# Patient Record
Sex: Female | Born: 1955
Health system: Southern US, Community
[De-identification: ages and names within clinical notes are randomized; demographics above are authoritative.]

## PROBLEM LIST (undated history)

## (undated) DIAGNOSIS — M109 Gout, unspecified: Secondary | ICD-10-CM

## (undated) DIAGNOSIS — D369 Benign neoplasm, unspecified site: Secondary | ICD-10-CM

## (undated) DIAGNOSIS — M349 Systemic sclerosis, unspecified: Secondary | ICD-10-CM

## (undated) DIAGNOSIS — M069 Rheumatoid arthritis, unspecified: Secondary | ICD-10-CM

## (undated) DIAGNOSIS — M797 Fibromyalgia: Secondary | ICD-10-CM

## (undated) DIAGNOSIS — K220 Achalasia of cardia: Secondary | ICD-10-CM

## (undated) DIAGNOSIS — I341 Nonrheumatic mitral (valve) prolapse: Secondary | ICD-10-CM

## (undated) DIAGNOSIS — J302 Other seasonal allergic rhinitis: Secondary | ICD-10-CM

## (undated) DIAGNOSIS — K219 Gastro-esophageal reflux disease without esophagitis: Secondary | ICD-10-CM

## (undated) DIAGNOSIS — I10 Essential (primary) hypertension: Secondary | ICD-10-CM

## (undated) DIAGNOSIS — I7300 Raynaud's syndrome without gangrene: Secondary | ICD-10-CM

## (undated) DIAGNOSIS — Z992 Dependence on renal dialysis: Secondary | ICD-10-CM

## (undated) DIAGNOSIS — N870 Mild cervical dysplasia: Secondary | ICD-10-CM

## (undated) DIAGNOSIS — Z9109 Other allergy status, other than to drugs and biological substances: Secondary | ICD-10-CM

## (undated) DIAGNOSIS — N186 End stage renal disease: Secondary | ICD-10-CM

## (undated) DIAGNOSIS — F419 Anxiety disorder, unspecified: Secondary | ICD-10-CM

## (undated) DIAGNOSIS — D696 Thrombocytopenia, unspecified: Secondary | ICD-10-CM

## (undated) DIAGNOSIS — K589 Irritable bowel syndrome without diarrhea: Secondary | ICD-10-CM

## (undated) HISTORY — PX: OTHER SURGICAL HISTORY: SHX169

## (undated) HISTORY — DX: Other seasonal allergic rhinitis: J30.2

## (undated) HISTORY — DX: Gout, unspecified: M10.9

## (undated) HISTORY — DX: Irritable bowel syndrome, unspecified: K58.9

## (undated) HISTORY — DX: Fibromyalgia: M79.7

## (undated) HISTORY — DX: Essential (primary) hypertension: I10

## (undated) HISTORY — PX: ANKLE FRACTURE SURGERY: SHX122

## (undated) HISTORY — PX: BREAST BIOPSY: SHX20

## (undated) HISTORY — DX: Rheumatoid arthritis, unspecified: M06.9

## (undated) HISTORY — DX: Achalasia of cardia: K22.0

## (undated) HISTORY — PX: MYOMECTOMY: SHX85

## (undated) HISTORY — DX: Benign neoplasm, unspecified site: D36.9

## (undated) HISTORY — DX: Systemic sclerosis, unspecified: M34.9

## (undated) HISTORY — DX: Raynaud's syndrome without gangrene: I73.00

## (undated) HISTORY — DX: Mild cervical dysplasia: N87.0

## (undated) HISTORY — DX: Nonrheumatic mitral (valve) prolapse: I34.1

---

## 1998-02-03 ENCOUNTER — Encounter: Payer: Self-pay | Admitting: Emergency Medicine

## 1998-02-03 ENCOUNTER — Observation Stay (HOSPITAL_COMMUNITY): Admission: EM | Admit: 1998-02-03 | Discharge: 1998-02-04 | Payer: Self-pay | Admitting: Emergency Medicine

## 1998-02-03 ENCOUNTER — Encounter: Payer: Self-pay | Admitting: Orthopedic Surgery

## 1998-02-04 ENCOUNTER — Encounter: Payer: Self-pay | Admitting: Orthopedic Surgery

## 1998-07-01 ENCOUNTER — Ambulatory Visit (HOSPITAL_COMMUNITY): Admission: RE | Admit: 1998-07-01 | Discharge: 1998-07-01 | Payer: Self-pay | Admitting: Family Medicine

## 1998-07-01 ENCOUNTER — Encounter: Payer: Self-pay | Admitting: Family Medicine

## 1998-07-07 ENCOUNTER — Ambulatory Visit (HOSPITAL_COMMUNITY): Admission: RE | Admit: 1998-07-07 | Discharge: 1998-07-07 | Payer: Self-pay | Admitting: Family Medicine

## 1998-07-07 ENCOUNTER — Encounter: Payer: Self-pay | Admitting: Family Medicine

## 1998-07-22 ENCOUNTER — Ambulatory Visit (HOSPITAL_COMMUNITY): Admission: RE | Admit: 1998-07-22 | Discharge: 1998-07-22 | Payer: Self-pay | Admitting: Gynecology

## 1998-08-15 ENCOUNTER — Ambulatory Visit (HOSPITAL_COMMUNITY): Admission: RE | Admit: 1998-08-15 | Discharge: 1998-08-15 | Payer: Self-pay | Admitting: Family Medicine

## 1998-08-15 ENCOUNTER — Encounter: Payer: Self-pay | Admitting: Family Medicine

## 1998-12-12 ENCOUNTER — Ambulatory Visit (HOSPITAL_COMMUNITY): Admission: RE | Admit: 1998-12-12 | Discharge: 1998-12-12 | Payer: Self-pay | Admitting: Rheumatology

## 1999-01-28 ENCOUNTER — Ambulatory Visit (HOSPITAL_COMMUNITY): Admission: RE | Admit: 1999-01-28 | Discharge: 1999-01-28 | Payer: Self-pay | Admitting: Rheumatology

## 1999-06-01 ENCOUNTER — Emergency Department (HOSPITAL_COMMUNITY): Admission: EM | Admit: 1999-06-01 | Discharge: 1999-06-01 | Payer: Self-pay | Admitting: Emergency Medicine

## 1999-07-07 ENCOUNTER — Ambulatory Visit (HOSPITAL_COMMUNITY): Admission: RE | Admit: 1999-07-07 | Discharge: 1999-07-07 | Payer: Self-pay | Admitting: Gastroenterology

## 1999-07-07 ENCOUNTER — Encounter: Payer: Self-pay | Admitting: Gastroenterology

## 1999-07-27 ENCOUNTER — Ambulatory Visit (HOSPITAL_COMMUNITY): Admission: RE | Admit: 1999-07-27 | Discharge: 1999-07-27 | Payer: Self-pay | Admitting: *Deleted

## 1999-07-27 ENCOUNTER — Encounter: Payer: Self-pay | Admitting: *Deleted

## 1999-08-12 ENCOUNTER — Encounter: Payer: Self-pay | Admitting: Gastroenterology

## 1999-08-12 ENCOUNTER — Ambulatory Visit (HOSPITAL_COMMUNITY): Admission: RE | Admit: 1999-08-12 | Discharge: 1999-08-12 | Payer: Self-pay | Admitting: Gastroenterology

## 1999-09-25 ENCOUNTER — Encounter: Payer: Self-pay | Admitting: Family Medicine

## 1999-09-25 ENCOUNTER — Ambulatory Visit (HOSPITAL_COMMUNITY): Admission: RE | Admit: 1999-09-25 | Discharge: 1999-09-25 | Payer: Self-pay | Admitting: Family Medicine

## 1999-11-27 ENCOUNTER — Encounter: Payer: Self-pay | Admitting: Family Medicine

## 1999-11-27 ENCOUNTER — Ambulatory Visit (HOSPITAL_COMMUNITY): Admission: RE | Admit: 1999-11-27 | Discharge: 1999-11-27 | Payer: Self-pay | Admitting: Family Medicine

## 2000-05-23 ENCOUNTER — Other Ambulatory Visit: Admission: RE | Admit: 2000-05-23 | Discharge: 2000-05-23 | Payer: Self-pay | Admitting: Gynecology

## 2000-05-23 ENCOUNTER — Encounter (INDEPENDENT_AMBULATORY_CARE_PROVIDER_SITE_OTHER): Payer: Self-pay

## 2000-07-13 ENCOUNTER — Encounter: Payer: Self-pay | Admitting: Family Medicine

## 2000-07-13 ENCOUNTER — Ambulatory Visit (HOSPITAL_COMMUNITY): Admission: RE | Admit: 2000-07-13 | Discharge: 2000-07-13 | Payer: Self-pay | Admitting: Family Medicine

## 2000-07-22 ENCOUNTER — Other Ambulatory Visit: Admission: RE | Admit: 2000-07-22 | Discharge: 2000-07-22 | Payer: Self-pay | Admitting: Gynecology

## 2000-08-26 ENCOUNTER — Ambulatory Visit (HOSPITAL_COMMUNITY): Admission: RE | Admit: 2000-08-26 | Discharge: 2000-08-26 | Payer: Self-pay | Admitting: Rheumatology

## 2000-09-02 ENCOUNTER — Ambulatory Visit (HOSPITAL_COMMUNITY): Admission: RE | Admit: 2000-09-02 | Discharge: 2000-09-02 | Payer: Self-pay | Admitting: Rheumatology

## 2000-10-21 ENCOUNTER — Encounter (INDEPENDENT_AMBULATORY_CARE_PROVIDER_SITE_OTHER): Payer: Self-pay | Admitting: Specialist

## 2000-10-21 ENCOUNTER — Ambulatory Visit (HOSPITAL_COMMUNITY): Admission: RE | Admit: 2000-10-21 | Discharge: 2000-10-21 | Payer: Self-pay | Admitting: Gynecology

## 2000-12-16 ENCOUNTER — Encounter: Payer: Self-pay | Admitting: Gastroenterology

## 2000-12-16 ENCOUNTER — Encounter: Admission: RE | Admit: 2000-12-16 | Discharge: 2000-12-16 | Payer: Self-pay | Admitting: Gastroenterology

## 2001-03-30 ENCOUNTER — Ambulatory Visit (HOSPITAL_COMMUNITY): Admission: RE | Admit: 2001-03-30 | Discharge: 2001-03-30 | Payer: Self-pay | Admitting: Gastroenterology

## 2001-06-22 ENCOUNTER — Other Ambulatory Visit: Admission: RE | Admit: 2001-06-22 | Discharge: 2001-06-22 | Payer: Self-pay | Admitting: Gynecology

## 2001-12-04 ENCOUNTER — Encounter: Payer: Self-pay | Admitting: Gastroenterology

## 2001-12-04 ENCOUNTER — Encounter: Admission: RE | Admit: 2001-12-04 | Discharge: 2001-12-04 | Payer: Self-pay | Admitting: Gastroenterology

## 2001-12-27 ENCOUNTER — Ambulatory Visit (HOSPITAL_COMMUNITY): Admission: RE | Admit: 2001-12-27 | Discharge: 2001-12-27 | Payer: Self-pay | Admitting: Rheumatology

## 2001-12-27 ENCOUNTER — Encounter: Payer: Self-pay | Admitting: Cardiovascular Disease

## 2002-06-04 ENCOUNTER — Ambulatory Visit: Admission: RE | Admit: 2002-06-04 | Discharge: 2002-06-04 | Payer: Self-pay | Admitting: Family Medicine

## 2002-06-04 ENCOUNTER — Encounter: Payer: Self-pay | Admitting: Family Medicine

## 2002-07-06 ENCOUNTER — Other Ambulatory Visit: Admission: RE | Admit: 2002-07-06 | Discharge: 2002-07-06 | Payer: Self-pay | Admitting: Gynecology

## 2003-02-15 ENCOUNTER — Ambulatory Visit (HOSPITAL_COMMUNITY): Admission: RE | Admit: 2003-02-15 | Discharge: 2003-02-15 | Payer: Self-pay | Admitting: Rheumatology

## 2003-07-03 ENCOUNTER — Other Ambulatory Visit: Admission: RE | Admit: 2003-07-03 | Discharge: 2003-07-03 | Payer: Self-pay | Admitting: Gynecology

## 2003-07-16 ENCOUNTER — Ambulatory Visit (HOSPITAL_COMMUNITY): Admission: RE | Admit: 2003-07-16 | Discharge: 2003-07-16 | Payer: Self-pay | Admitting: Family Medicine

## 2003-09-23 ENCOUNTER — Other Ambulatory Visit: Admission: RE | Admit: 2003-09-23 | Discharge: 2003-09-23 | Payer: Self-pay | Admitting: Gynecology

## 2003-12-20 ENCOUNTER — Ambulatory Visit (HOSPITAL_BASED_OUTPATIENT_CLINIC_OR_DEPARTMENT_OTHER): Admission: RE | Admit: 2003-12-20 | Discharge: 2003-12-20 | Payer: Self-pay | Admitting: Gynecology

## 2004-02-14 ENCOUNTER — Emergency Department (HOSPITAL_COMMUNITY): Admission: EM | Admit: 2004-02-14 | Discharge: 2004-02-14 | Payer: Self-pay | Admitting: Emergency Medicine

## 2004-04-09 ENCOUNTER — Other Ambulatory Visit: Admission: RE | Admit: 2004-04-09 | Discharge: 2004-04-09 | Payer: Self-pay | Admitting: Gynecology

## 2004-10-23 ENCOUNTER — Other Ambulatory Visit: Admission: RE | Admit: 2004-10-23 | Discharge: 2004-10-23 | Payer: Self-pay | Admitting: Gynecology

## 2005-02-12 ENCOUNTER — Encounter: Admission: RE | Admit: 2005-02-12 | Discharge: 2005-02-12 | Payer: Self-pay | Admitting: Orthopaedic Surgery

## 2005-03-12 ENCOUNTER — Other Ambulatory Visit: Admission: RE | Admit: 2005-03-12 | Discharge: 2005-03-12 | Payer: Self-pay | Admitting: Gynecology

## 2005-07-08 ENCOUNTER — Encounter: Admission: RE | Admit: 2005-07-08 | Discharge: 2005-07-08 | Payer: Self-pay | Admitting: Gastroenterology

## 2005-11-04 ENCOUNTER — Other Ambulatory Visit: Admission: RE | Admit: 2005-11-04 | Discharge: 2005-11-04 | Payer: Self-pay | Admitting: Gynecology

## 2005-11-11 ENCOUNTER — Encounter: Admission: RE | Admit: 2005-11-11 | Discharge: 2005-11-11 | Payer: Self-pay | Admitting: Cardiology

## 2006-11-14 ENCOUNTER — Other Ambulatory Visit: Admission: RE | Admit: 2006-11-14 | Discharge: 2006-11-14 | Payer: Self-pay | Admitting: Gynecology

## 2007-11-17 ENCOUNTER — Ambulatory Visit: Payer: Self-pay | Admitting: Gynecology

## 2007-12-05 ENCOUNTER — Other Ambulatory Visit: Admission: RE | Admit: 2007-12-05 | Discharge: 2007-12-05 | Payer: Self-pay | Admitting: Gynecology

## 2007-12-05 ENCOUNTER — Encounter: Payer: Self-pay | Admitting: Gynecology

## 2007-12-05 ENCOUNTER — Ambulatory Visit: Payer: Self-pay | Admitting: Gynecology

## 2008-01-02 ENCOUNTER — Ambulatory Visit: Payer: Self-pay | Admitting: Gynecology

## 2008-01-08 DIAGNOSIS — D369 Benign neoplasm, unspecified site: Secondary | ICD-10-CM

## 2008-01-08 HISTORY — PX: COLONOSCOPY W/ BIOPSIES: SHX1374

## 2008-01-08 HISTORY — DX: Benign neoplasm, unspecified site: D36.9

## 2008-02-14 ENCOUNTER — Ambulatory Visit: Payer: Self-pay | Admitting: Gynecology

## 2008-02-21 ENCOUNTER — Ambulatory Visit: Payer: Self-pay | Admitting: Gynecology

## 2008-05-29 ENCOUNTER — Encounter: Admission: RE | Admit: 2008-05-29 | Discharge: 2008-05-29 | Payer: Self-pay | Admitting: Orthopaedic Surgery

## 2008-06-13 ENCOUNTER — Encounter: Admission: RE | Admit: 2008-06-13 | Discharge: 2008-06-13 | Payer: Self-pay | Admitting: Family Medicine

## 2008-06-25 ENCOUNTER — Ambulatory Visit (HOSPITAL_COMMUNITY): Admission: RE | Admit: 2008-06-25 | Discharge: 2008-06-25 | Payer: Self-pay | Admitting: Family Medicine

## 2008-07-29 ENCOUNTER — Encounter: Admission: RE | Admit: 2008-07-29 | Discharge: 2008-07-29 | Payer: Self-pay | Admitting: Orthopaedic Surgery

## 2008-07-31 ENCOUNTER — Ambulatory Visit: Payer: Self-pay | Admitting: Gynecology

## 2008-08-07 ENCOUNTER — Ambulatory Visit: Payer: Self-pay | Admitting: Gynecology

## 2008-08-15 ENCOUNTER — Ambulatory Visit: Payer: Self-pay | Admitting: Gynecology

## 2008-11-27 ENCOUNTER — Ambulatory Visit: Payer: Self-pay | Admitting: Gynecology

## 2008-12-10 ENCOUNTER — Other Ambulatory Visit: Admission: RE | Admit: 2008-12-10 | Discharge: 2008-12-10 | Payer: Self-pay | Admitting: Gynecology

## 2008-12-10 ENCOUNTER — Ambulatory Visit: Payer: Self-pay | Admitting: Gynecology

## 2009-01-01 ENCOUNTER — Ambulatory Visit: Payer: Self-pay | Admitting: Gynecology

## 2009-01-11 HISTORY — PX: PELVIC LAPAROSCOPY: SHX162

## 2009-01-28 ENCOUNTER — Encounter: Admission: RE | Admit: 2009-01-28 | Discharge: 2009-01-28 | Payer: Self-pay | Admitting: Gastroenterology

## 2009-06-02 ENCOUNTER — Ambulatory Visit: Payer: Self-pay | Admitting: Gynecology

## 2009-09-03 ENCOUNTER — Ambulatory Visit: Payer: Self-pay | Admitting: Gynecology

## 2009-10-07 ENCOUNTER — Ambulatory Visit: Payer: Self-pay | Admitting: Gynecology

## 2009-10-10 ENCOUNTER — Ambulatory Visit: Payer: Self-pay | Admitting: Gynecology

## 2009-11-06 ENCOUNTER — Other Ambulatory Visit: Admission: RE | Admit: 2009-11-06 | Discharge: 2009-11-06 | Payer: Self-pay | Admitting: Gynecology

## 2009-11-06 ENCOUNTER — Ambulatory Visit: Payer: Self-pay | Admitting: Gynecology

## 2009-12-16 ENCOUNTER — Ambulatory Visit (HOSPITAL_COMMUNITY)
Admission: RE | Admit: 2009-12-16 | Discharge: 2009-12-16 | Payer: Self-pay | Source: Home / Self Care | Admitting: Internal Medicine

## 2010-05-29 NOTE — H&P (Signed)
Katie Nunez, Katie Nunez              ACCOUNT NO.:  [id number]   MEDICAL RECORD NO.:  [id number]            PATIENT TYPE:   LOCATION:                                 FACILITY:   PHYSICIAN:  Juan H. Toney Rakes, M.D.     DATE OF BIRTH:   DATE OF ADMISSION:  DATE OF DISCHARGE:                                HISTORY & PHYSICAL   CHIEF COMPLAINT:  Cervical intra-epithelial neoplasia.   HISTORY:  The patient is a 55 year old gravida 1, para 1, who was seen in  the office on July 03, 2003, for annual gynecological examination.  The  patient was seen in the office on November 05, 2003, to discuss the results  of her colposcopic directed biopsy which had been done on October 31, 2003,  as a result of two previous Pap smears which had demonstrated atypical  squamous cells of undetermined significance.  Her cervical biopsies had  demonstrated benign endocervical curettings and the ectocervix at 7 o'clock  position were normal.  The 5 o'clock and 11 o'clock both had low-grade CIN 1  some HPV changes.  Treatment modalities have been discussed with the  patient, she opted to proceed with CO2 laser ablation as an outpatient  procedure under anesthesia.  The patient had previously been provided with  literature information from [employer] of OB/GYN on diagnosis and  management of abnormal Pap smears.   PAST MEDICAL HISTORY:  1.  Scleroderma.  2.  Raynaud's phenomenon.  3.  Mitral valve prolapse.  4.  Irritable bowel syndrome.  5.  Seasonal allergies.   ALLERGIES:  No known drug allergies.   PAST SURGICAL HISTORY:  1.  Right ankle fracture, open reduction.  2.  Myomectomy.  3.  Diagnostic laparoscopy.  4.  Cesarean section.   CURRENT MEDICATIONS:  1.  Baby aspirin.  2.  Norvasc.  3.  Levsin.   PHYSICAL EXAMINATION:  VITAL SIGNS:  The patient weighs 186 pounds, height 5  feet 6 inches tall, blood pressure 126/80.  HEENT:  Unremarkable.  NECK:  Supple.  Trachea was midline.  No  carotid bruits, no thyromegaly.  LUNGS:  Clear to auscultation.  No rhonchi or wheezes.  HEART:  Regular rate and rhythm, no murmurs, rubs, or gallops.  BREASTS:  Done at time of her annual gynecological examination in June 2004,  was reported to be normal.  ABDOMEN:  Soft, nontender, without any rebound or guarding.  PELVIC:  Bartholin's, urethra, and Skene's glands within normal limits.  The  patient with known history of leiomyomata uteri which have been stable over  the past several years;  the large one measuring 2 cm intramural.  Adnexa no  palpable mass or tenderness.  RECTAL:  Not done.   ASSESSMENT:  [name], para 1, with abnormal Pap smears in  June 2005, and September 2005, with atypical squamous cells.  Subsequent  followup with colposcopy on October 31, 2003, demonstrated a benign  endocervical mucosa from the [facility] and cervical biopsy at the 5 o'clock  position demonstrated low-grade CIN 1 at the 11 o'clock position, also  CIN 1  along with HPV changes.  The patient had been presented with various  treatment options and decided to proceed with CO2 laser ablation of cervical  dysplasia.  The risks, benefits, and pros and cons were discussed.  All  questions were answered and will follow accordingly.  Of note, the patient  is using condoms as a form of contraception.   PLAN:  The patient is scheduled for CO2 laser ablation of ongoing cervical  dysplasia at [facility] on Friday, December 20, 2003, at 1  p.m.     Juan   JHF/MEDQ  D:  12/19/2003  T:  12/19/2003  Job:  [id number]

## 2010-05-29 NOTE — H&P (Signed)
[place]  Patient:    Katie Nunez, Katie Nunez Visit Number: [id number] MRN: [id number]          Service Type: Attending:  Starlyn Skeans. Toney Rakes, M.D. Dictated by:   Starlyn Skeans Toney Rakes, M.D. Adm. Date:  10/21/00                           History and Physical  CHIEF COMPLAINT:              Pelvic pain and history of ovarian cyst.  HISTORY OF PRESENT ILLNESS:   The patient is a 55 year old gravida 1 para 1, who was seen in the office for annual gynecological examination on July 22, 2000, patient with known history of Raynauds phenomenon as well as scleroderma.  She had been on Lotensin and had been switched to Monopril.  She has been under the care of Dr. Tamala Julian and Dr. Rockwell Alexandria respectively.  She had an ultrasound done in the office on July 22, 2000 due to the fact of abdominal girth and pain, and history of irritable bowel syndrome and bloating sensation.  Her abdomen had been difficult to examine.  The ultrasound demonstrated that her uterus had several small fibroids, as noted in the past, her right ovary had a 13 x 13 mm thick-walled cyst, and the left ovary had a complex cyst with a thin wall, multiple internal septations measuring 51 x 37 x 53 mm, with a reticular echo pattern noted.  The cul-de-sac was negative. She denied any dysmenorrhea or dyspareunia, just left lower quadrant pain.  We had discussed about the limitations of CA 125 testing and to have her return and repeat the ultrasound, which she did on September 01, 2000.  She has had history in the past of cysts off and on resolving and from time to time reappearing.  The ultrasound that was done on September 01, 2000 demonstrated that the previously seen complex cyst on the left adnexa had resolved.  It appeared to have been a collapsed thick-walled corpus luteum cyst, which was now measuring 20 x 8 x 20 mm.  The right ovary had now a complex thin-walled cystic mass measuring 4.2 x 2.7 x 4.9 cm with a  reticular echo pattern, diffuse low-level echos particularly noted consistent with hemorrhagic cyst. There was a negative color-flow Doppler.  Due to the fact that she has continued to have pelvic pain off and on we discussed proceeding then with diagnostic laparoscopy to rule out endometriosis or pelvic adhesions, and possibly do laparotomy.  PAST SURGICAL HISTORY:        1. Two progress cesarean sections.                               2. Abdominal myomectomy.                               3. Fracture of left ankle.  PAST MEDICAL HISTORY:         1. History of scleroderma.                               2. Raynauds phenomenon.  3. Mitral valve prolapse.                               4. Irritable bowel syndrome.                               5. Seasonal allergies.  CURRENT MEDICATIONS:          1. Monopril for hypertension.                               2. Nexium.                               3. Baby aspirin.                               4. Klonopin.  ALLERGIES:                    She denies any allergies.  FAMILY HISTORY:               Significant for hypertension.  PHYSICAL EXAMINATION:  GENERAL:                      Well-developed, well-nourished female.  VITAL SIGNS:                  Slightly overweight at 175 pounds.  HEENT:                        Unremarkable.  NECK:                         Supple.  Trachea midline.  No carotid bruits. No thyromegaly.  LUNGS:                        Clear to auscultation, without rhonchi or wheezes.  HEART:                        Regular rate and rhythm.  No murmurs or gallops.  BREAST:                       Examination was done at the time of her annual examination on July 22, 2000 and reported to be normal.  ABDOMEN:                      Soft, nontender, without rebound or guarding. Abdomen with increased abdominal girth.  Good bowel sounds.  PELVIC:                       Bartholin, urethral, and  Skenes glands within normal limits.  Vagina and cervix, no lesions or discharge.  Uterus anteverted and normal size, shape, and consistency, though some limitation on bimanual examination.  No masses in right adnexa, questionable fullness on left adnexa.  RECTAL:                       Unremarkable.  ASSESSMENT:  This is a 55 year old gravida 1 para 1, using barrier contraception, with history of Raynauds phenomenon and scleroderma, who had been followed by Dr. Knute Neu, who had contacted me stating that she would not be a candidate for contraception for menstrual regulation or for contraception due to the fact that she had positive anticardiolipin antibodies such as IgM, but in low quantities.  Her lupus anticoagulant had been negative.  Patient with chronic pelvic pain, with recently resolved left ovarian cyst, now right ovarian cyst.  We decided to proceed with diagnostic laparoscopy and possible laparotomy, possible ovarian cystectomy.  The patient has given full authorization to remove her right adnexa if it appears there is evidence of any disease.  We discussed in the event that if this were malignancy she would need to undergo a full staging procedure and that would mean total abdominal hysterectomy and bilateral salpingo-oophorectomy, pelvic and periaortic lymphadenectomy, and partial omentectomy or removal over scope, and returning at another date after full consultation with gynecologic oncologist for full staging operation after discussing with the patient and her family.  We discussed if there if is any technical difficulty entering the abdominal cavity that we may need to open her abdomen and proceed with an open laparotomy technique and proceed with ovarian cystectomy and/or lysis of adhesions and treatment of endometriosis if found.  Other potential risks were discussed including infection, although she will receive prophylaxis antibiotics, trauma to  internal organs with the laparoscope and laser which would require open laparotomy and corrective surgery at that time, also in the  event of uncontrolled hemorrhage she is aware that she could receive a blood transfusion with potential risk of anaphylactic reaction, hepatitis and AIDS, and finally is uncontrollable hemorrhage or extensive amount of endometriosis is seen she has given full authorization to proceed with total abdominal hysterectomy and bilateral salpingo-oophorectomy.  Also, for deep vein thrombosis prophylaxis she will have pneumatic compression stockings as well. Will also discuss with the patient and will add to the consent form that if she wants to proceed with sterilization procedure we may do so at the time of diagnostic laparoscopy and ovarian cystectomy.  All these issues were discussed with the patient, all questions were answered and followed accordingly.  PLAN:                         As per assessment above. Dictated by:   Starlyn Skeans Toney Rakes, M.D. Attending:  Starlyn Skeans. Toney Rakes, M.D. DD:  10/20/00 TD:  10/21/00 Job: [id number]

## 2010-05-29 NOTE — Op Note (Signed)
[place]  Patient:    Katie Nunez, Katie Nunez Visit Number: [id number] MRN: [id number]          Service Type: DSU Location: [id number] Attending Physician:  Rosamaria Lints Dictated by:   Starlyn Skeans. Toney Rakes, M.D. Proc. Date: 10/21/00 Admit Date:  10/21/2000                             Operative Report  INDICATIONS FOR OPERATION:    [name], para 1 with history of chronic pelvic pain and a past history of two cesarean sections, along with abdominal myomectomy.  The patient has history of ovarian cysts, resolving and recurring again; the most recent ultrasound demonstrated a right ovarian cyst, although the patient was having discomfort in her left lower quadrant.  PREOPERATIVE DIAGNOSES:       1. Chronic pelvic pain.                               2. Right ovarian cyst.                               3. Fibroid uterus.  POSTOPERATIVE DIAGNOSIS:      Leiomyomatosis uteri.  ANESTHESIA:                   General endotracheal.  PROCEDURE PERFORMED:          Diagnostic laparoscopy.  SURGEON:                      Juan H. Toney Rakes, M.D.  FINDINGS:                     1. Two small intramural leiomyomas.                               2. Normal tubes, ovaries and cul-de-sac.                               3. Smooth appearing appendix and liver.  DESCRIPTION OF PROCEDURE:     After the patient was adequately counseled, she was taken to the operating room and she underwent successful general endotracheal anesthesia.  She was placed in the lower lithotomy position.  The abdomen, vagina and perineum were prepped and draped in the usual sterile fashion.  The bladder was evacuated of its contents with a red rubber Dellia Nims of approximately 50 cc.  Examination under anesthesia demonstrated anteverted uterus, slightly irregular shape.  A Hulka tenaculum was placed for manipulation during the laparoscopic procedure.  A small stab incision  was made underneath the umbilical regions, followed by insertion of the Veress needle.  Due to the patients abdominal girth, one attempt was made and was not possible to obtain an adequate pneumoperitoneum.  So, an open laparoscopic technique was utilized by identifying the fascia and making a small nick; putting a pursestring of 3-0 Vicryl suture and then inserting the laparoscopic sheath and inserting the scope, then inflated the abdomen.  After this was accomplished with a laparoscope, systemic inspection of the entire pelvis including the anterior and posterior cul-de-sac, did not reveal any evidence of endometriosis or adhesions.  Both tubes and  ovaries were normal size, shape and contour.  There was some fluid in the cul-de-sac, which was aspirated and submitted for histological evaluation.  The appendix was smooth in appearance, as was the surface of the liver.  There was no evidence of any cysts present, so the case was completed.  The laparoscope and sheath were removed after the pneumoperitoneum was removed.  The previously placed pursestring with 3-0 Vicryl suture was secured and tied.  The subcutaneous tissue in the subumbilical region was then approximated with plain catgut suture.  For postoperative analgesia approximately 8 cc of 0.25% Marcaine was infiltrated subcutaneously.  The Hulka tenaculum was removed.  The patient was extubated, transferred to recovery room with stable vital signs.  Blood loss during the procedure was minimal.  Fluid resuscitation consisted of 1000 cc of lactated Ringers.  Due to patients history of mitral valve prolapse, she did receive 1 g of Cefotan as well. Dictated by:   Starlyn Skeans Toney Rakes, M.D. Attending Physician:  Rosamaria Lints DD:  10/21/00 TD:  10/21/00 Job: [id number]

## 2010-05-29 NOTE — Op Note (Signed)
[place]  Patient:    Katie Nunez, Katie Nunez Visit Number: [id number] MRN: [id number]          Service Type: END Location: ENDO Attending Physician:  Juanita Craver Dictated by:   Nelwyn Salisbury, M.D. Proc. Date: 03/30/01 Admit Date:  03/30/2001 Discharge Date: 03/30/2001   CC:         Uvaldo Rising, M.D.  [name], M.D.   Operative Report  PROCEDURE PERFORMED:  Colonoscopy and endoscopy.  ENDOSCOPIST:  Nelwyn Salisbury, M.D.  INSTRUMENTS USED:  Olympus video colonoscope.  INDICATIONS FOR PROCEDURE:  A 55 year old [place] American female with a history of Raynauds disease, question scleroderma, with history of weight loss, left lower quadrant pain, and guaiac positive stool.  Rule out colonic polyps, masses, hemorrhoids, etc. Question irritable bowel disease.  PREPROCEDURE PREPARATION:  Informed consent was procured from the patient. The patient fasted for 8 hours prior to the procedure and prepped with a bottle of magnesium citrate and a gallon of NuLYTELY the night prior to the procedure.  PREPROCEDURE PHYSICAL:  VITAL SIGNS:  The patient had stable vital signs.  NECK:  Supple.  CHEST:  Clear to auscultation.  HEART:  S1, S2 regular.  ABDOMEN:  Soft with normal bowel sounds.  DESCRIPTION OF PROCEDURE:  The patient was placed in the left lateral decubitus position and sedated with 100 mg of Demerol and 8 mg of Versed intravenously.  Once the patient was adequately sedated, maintained on low flow oxygen and continuous cardiac monitoring, the Olympus video colonoscope was advanced into the rectum to the cecum and terminal ileum without difficulty except for small internal hemorrhoid seen on retroflexion.  No other abnormalities were noted.  No masses, polyps, erosions, lacerations, or diverticula were present.  The entire colonic mucosa of the terminal ileum appeared normal.  IMPRESSION:  Normal colonoscopy except for small  nonbleeding internal hemorrhoids.  RECOMMENDATIONS: 1. A high fiber diet has been recommended along with liberal fluid intake. 2. Repeat guaiac testing will be done. 3. Further recommendations made in followup within the next two weeks. Dictated by:   Nelwyn Salisbury, M.D. Attending Physician:  Juanita Craver DD:  03/30/01 TD:  03/31/01 Job: [id number]

## 2010-05-29 NOTE — Op Note (Signed)
Katie Nunez, Katie Nunez              ACCOUNT NO.:  [id number]   MEDICAL RECORD NO.:  [id number]          PATIENT TYPE:  AMB   LOCATION:  [facility]                         FACILITY:  [facility]   PHYSICIAN:  Juan H. Toney Rakes, M.D.DATE OF BIRTH:  05/14/1955   DATE OF PROCEDURE:  12/20/2003  DATE OF DISCHARGE:                                 OPERATIVE REPORT   INDICATIONS FOR OPERATION:  [name], para 1 with a history  of cervical intraepithelial neoplasia with human papilloma virus changes at  the 5 and 11 o'clock position, which was confirmed, not only by Pap smear  but by colposcopic-directed biopsy.  The patient has been provided with  options and treatments.  She decided to proceed with outpatient CO2 laser  ablation of the cervix.  Prior information and literature from the [employer] of OB/GYN had been provided.   PREOPERATIVE DIAGNOSIS:  Cervical intraepithelial neoplasia at different  areas of the cervic, CIN 1 with human papilloma virus  changes.   POSTOPERATIVE DIAGNOSIS:  Cervical intraepithelial neoplasia at different  areas of the cervic, CIN 1 with human papilloma virus  changes.   ANESTHESIA:  Intravenous sedation along with a paracervical block,  consisting of 2% Xylocaine with 1:100,000 epinephrine.   PROCEDURE PERFORMED:  CO2 ablation of cervical dysplasia.   DESCRIPTION OF OPERATION:  After the patient was adequately counseled, she  was taken to the operating room, where she was given intravenous sedation.  She was placed in the high lithotomy position.  The vaginal was prepped and  draped in the usual sterile fashion.  The cervix was cleansed with acetic  acid and prior to that, 2% lidocaine with 1:100,000 epinephrine was  infiltrated into the cervical stroma at the 2, 4, 8, and 10 o'clock  positions for a total of 20 cc.  The previous biopsy sites were visualized  under colposcopic evaluation today.  The same areas that had been biopsied  in the  office, which were 5 o'clock and 11 o'clock, were noted.  With the  CO2 laser on continuous mode on 8 watts with a high defocusing beam, the  area was demarcated in a circumferential fashion, asserting at least 4-5 mm  away from the lesion sites.  The cervix was then ablated for a depth of  approximately 4-6 mm in a brushing-like technique, up to the level and to  include the transformation zone.  Monsel solution was placed for hemostasis.  The patient tolerated the procedure well.  She was transferred to the  recovery room with stable vital signs.  Blood loss was minimal.  Fluid  resuscitation consisted of 500 cc of lactated Ringer's.     Juan   JHF/MEDQ  D:  12/20/2003  T:  12/20/2003  Job:  [id number]

## 2010-10-30 ENCOUNTER — Other Ambulatory Visit: Payer: Self-pay | Admitting: *Deleted

## 2010-10-30 ENCOUNTER — Telehealth: Payer: Self-pay | Admitting: *Deleted

## 2010-10-30 DIAGNOSIS — D259 Leiomyoma of uterus, unspecified: Secondary | ICD-10-CM

## 2010-10-30 NOTE — Telephone Encounter (Signed)
Per JF pt can have ultrasound done with annual. Order placed in computer.

## 2010-10-30 NOTE — Telephone Encounter (Signed)
Pt called to schedule her annual and states that she has an ultrasound done prior to having annual done? Pt only had recall letter in computer for annual, none thing in chart stating ultrasound..Please advise if pt can have ultrasound done prior to annual.

## 2010-11-06 ENCOUNTER — Encounter: Payer: Self-pay | Admitting: Gynecology

## 2010-11-10 ENCOUNTER — Encounter: Payer: Self-pay | Admitting: Anesthesiology

## 2010-11-16 ENCOUNTER — Ambulatory Visit (INDEPENDENT_AMBULATORY_CARE_PROVIDER_SITE_OTHER): Payer: BC Managed Care – PPO | Admitting: Gynecology

## 2010-11-16 ENCOUNTER — Other Ambulatory Visit (HOSPITAL_COMMUNITY)
Admission: RE | Admit: 2010-11-16 | Discharge: 2010-11-16 | Disposition: A | Discharge status: Home / Self Care | Payer: BC Managed Care – PPO | Source: Ambulatory Visit | Attending: Gynecology | Admitting: Gynecology

## 2010-11-16 ENCOUNTER — Encounter: Payer: Self-pay | Admitting: Gynecology

## 2010-11-16 ENCOUNTER — Ambulatory Visit (INDEPENDENT_AMBULATORY_CARE_PROVIDER_SITE_OTHER): Payer: BC Managed Care – PPO

## 2010-11-16 DIAGNOSIS — N951 Menopausal and female climacteric states: Secondary | ICD-10-CM | POA: Insufficient documentation

## 2010-11-16 DIAGNOSIS — K921 Melena: Secondary | ICD-10-CM

## 2010-11-16 DIAGNOSIS — Z1211 Encounter for screening for malignant neoplasm of colon: Secondary | ICD-10-CM

## 2010-11-16 DIAGNOSIS — I10 Essential (primary) hypertension: Secondary | ICD-10-CM | POA: Insufficient documentation

## 2010-11-16 DIAGNOSIS — Z01419 Encounter for gynecological examination (general) (routine) without abnormal findings: Secondary | ICD-10-CM | POA: Insufficient documentation

## 2010-11-16 DIAGNOSIS — Z1159 Encounter for screening for other viral diseases: Secondary | ICD-10-CM | POA: Insufficient documentation

## 2010-11-16 DIAGNOSIS — M069 Rheumatoid arthritis, unspecified: Secondary | ICD-10-CM | POA: Insufficient documentation

## 2010-11-16 DIAGNOSIS — D259 Leiomyoma of uterus, unspecified: Secondary | ICD-10-CM

## 2010-11-16 DIAGNOSIS — N644 Mastodynia: Secondary | ICD-10-CM

## 2010-11-16 DIAGNOSIS — I7300 Raynaud's syndrome without gangrene: Secondary | ICD-10-CM | POA: Insufficient documentation

## 2010-11-16 DIAGNOSIS — M349 Systemic sclerosis, unspecified: Secondary | ICD-10-CM | POA: Insufficient documentation

## 2010-11-16 DIAGNOSIS — D252 Subserosal leiomyoma of uterus: Secondary | ICD-10-CM

## 2010-11-16 DIAGNOSIS — D251 Intramural leiomyoma of uterus: Secondary | ICD-10-CM

## 2010-11-16 LAB — POC HEMOCCULT BLD/STL (OFFICE/1-CARD/DIAGNOSTIC): Fecal Occult Blood, POC: POSITIVE

## 2010-11-16 NOTE — Progress Notes (Signed)
Katie Nunez 04/29/1955 [id number]   History:    55 y.o.  for annual exam . Patient with history of scleroderma, and Raynaud's syndrome and a and rheumatoid arthritis. She's been followed by her rheumatologist. And also her primary physician has been monitor her hypertension as well for which she's currently on Norvasc and did not take her blood pressure medication today. Review of her record indicated she had her last colonoscopy was in 2009 and benign polyps were noted. Her last bone density study was normal in September 2011. She is taking her calcium and vitamin D with osteoporosis prevention. Patient with history of fibroid uterus had ultrasound done today for monitoring any changes. Patient does suffer from vasomotor symptoms but has been reluctant to start hormone replacement therapy and her primary physician has recommended that she be placed on antidepressant agent and she has been reluctant as well.  Past medical history,surgical history, family history and social history were all reviewed and documented in the EPIC chart.  Gynecologic History Patient's last menstrual period was 11/15/2008. Contraception: Postmenopausal Last Pap: 2011. Results were: normal Last mammogram: 2012. Results were: normal  Obstetric History OB History    Grav Para Term Preterm Abortions TAB SAB Ect Mult Living   1 1 1       1      # Outc Date GA Lbr Len/2nd Wgt Sex Del Anes PTL Lv   1 TRM     M CS   Yes       ROS:  Was performed and pertinent positives and negatives are included in the history.  Exam: chaperone present  BP 134/92  Ht 5' 5.25" (1.657 m)  Wt 200 lb (90.719 kg)  BMI 33.03 kg/m2  LMP 11/15/2008  Body mass index is 33.03 kg/(m^2).  General appearance : Well developed well nourished female. No acute distress HEENT: Neck supple, trachea midline, no carotid bruits, no thyroidmegaly Lungs: Clear to auscultation, no rhonchi or wheezes, or rib retractions  Heart: Regular rate and  rhythm, no murmurs or gallops Breast:Examined in sitting and supine position were symmetrical in appearance, no palpable masses or tenderness,  no skin retraction, no nipple inversion, no nipple discharge, no skin discoloration, no axillary or supraclavicular lymphadenopathy Abdomen: no palpable masses or tenderness, no rebound or guarding Extremities: no edema or skin discoloration or tenderness  Pelvic:  Bartholin, Urethra, Skene Glands: Within normal limits             Vagina: No gross lesions or discharge  Cervix: No gross lesions or discharge  Uterus  anteverted, normal size, shape and consistency, non-tender and mobile  Adnexa  Without masses or tenderness  Anus and perineum  normal   Rectovaginal  normal sphincter tone without palpated masses or tenderness             Hemoccult obtained results pending at time of this dictation     Assessment/Plan:  55 y.o. female for annual exam with vasomotor symptoms and history collagen vascular disease and history of depression and hypertension. I have recommended patient be placed on Effexor which would address her mild depressive states as well as to help with her vasomotor symptoms. Patient was going to decide and let us know. She was encouraged to continue her monthly self breast examination her Pap smear was done today as well as fecal occult blood testing will notify her there is any abnormality of any of these tests we'll follow up with a bone density study next year which will  be due. And she will followup with her internist as well as her rheumatologist and she will take her blood pressure medication issue she gets home. The ultrasound today demonstrated no change in the fibroid uterus. She has 6 small fibroids the largest one measuring 30 x 30 mm. Both ovaries normal endometrial stripe 3.7 mm. We'll see her back in one year or when necessary pertinent dictation   [facility] MD, 1:25 PM 11/16/2010

## 2010-12-22 ENCOUNTER — Other Ambulatory Visit (HOSPITAL_COMMUNITY): Payer: Self-pay | Admitting: Family Medicine

## 2010-12-22 DIAGNOSIS — M349 Systemic sclerosis, unspecified: Secondary | ICD-10-CM

## 2010-12-28 ENCOUNTER — Ambulatory Visit (HOSPITAL_COMMUNITY)
Admission: RE | Admit: 2010-12-28 | Discharge: 2010-12-28 | Disposition: A | Discharge status: Home / Self Care | Payer: BC Managed Care – PPO | Source: Ambulatory Visit | Attending: Family Medicine | Admitting: Family Medicine

## 2010-12-28 DIAGNOSIS — M349 Systemic sclerosis, unspecified: Secondary | ICD-10-CM | POA: Insufficient documentation

## 2011-01-26 ENCOUNTER — Telehealth: Payer: Self-pay | Admitting: *Deleted

## 2011-01-26 NOTE — Telephone Encounter (Signed)
Pt informed with the below note. 

## 2011-01-26 NOTE — Telephone Encounter (Signed)
Patient may have a repeat Pap smear in February March and she had atypical squamous cells of undetermined significance recently. Her fecal occult blood testing was positive this will need to be repeated again or she may need to followup with her gastroenterologist.

## 2011-01-26 NOTE — Telephone Encounter (Signed)
Pt has abnormal pap result note on 11/16/10 and was told to return in 6 months for recheck. Pt will have new insurance starting April 2013 and would like to know if she could have repeat pap sooner? Please advise

## 2011-02-26 ENCOUNTER — Encounter: Payer: Self-pay | Admitting: *Deleted

## 2011-02-26 NOTE — Progress Notes (Signed)
Patient ID: Katie Nunez, female   DOB: [birth year], 56 y.o.   MRN: [id number] Left fecal occult blood card for pick up at front desk for pt to pick up.

## 2011-03-04 ENCOUNTER — Encounter: Payer: Self-pay | Admitting: Gynecology

## 2011-03-04 ENCOUNTER — Other Ambulatory Visit (HOSPITAL_COMMUNITY)
Admission: RE | Admit: 2011-03-04 | Discharge: 2011-03-04 | Disposition: A | Discharge status: Home / Self Care | Payer: BC Managed Care – PPO | Source: Ambulatory Visit | Attending: Gynecology | Admitting: Gynecology

## 2011-03-04 ENCOUNTER — Ambulatory Visit (INDEPENDENT_AMBULATORY_CARE_PROVIDER_SITE_OTHER): Payer: BC Managed Care – PPO | Admitting: Gynecology

## 2011-03-04 VITALS — BP 128/82

## 2011-03-04 DIAGNOSIS — Z01419 Encounter for gynecological examination (general) (routine) without abnormal findings: Secondary | ICD-10-CM | POA: Insufficient documentation

## 2011-03-04 DIAGNOSIS — R87610 Atypical squamous cells of undetermined significance on cytologic smear of cervix (ASC-US): Secondary | ICD-10-CM

## 2011-03-04 NOTE — Progress Notes (Signed)
Patient presented to the office today for repeat Pap smear. At time of her annual exam in November 2012 her Pap smear demonstrated the following:  Atypical squamous cells of undetermined significance high-risk HPV and not detected  Pap smear was repeated today. Patient is to resubmit fecal occult blood testing due to the fact that her last visit he was positive for blood. Although patient has history of irritable bowel syndrome. If it is positive once again she will be referred to the gastroenterologist, Dr. Collene Mares, who had done her colonoscopy in 2009 whereby tubular adenoma it was noted with no evidence of high-grade dysplasia or carcinoma.

## 2011-03-04 NOTE — Patient Instructions (Signed)
Remember to send [place] the hemocult cards

## 2011-03-19 ENCOUNTER — Telehealth: Payer: Self-pay | Admitting: *Deleted

## 2011-03-19 NOTE — Telephone Encounter (Signed)
Please inform patient and her Pap smear was negative

## 2011-03-19 NOTE — Telephone Encounter (Signed)
Pt called requesting recent pap results on 2/21. Please advise

## 2011-03-19 NOTE — Telephone Encounter (Signed)
Pt informed with the below note. 

## 2011-03-24 DIAGNOSIS — K921 Melena: Secondary | ICD-10-CM

## 2011-03-24 LAB — POC HEMOCCULT BLD/STL (OFFICE/1-CARD/DIAGNOSTIC): Fecal Occult Blood, POC: POSITIVE

## 2011-03-25 ENCOUNTER — Other Ambulatory Visit: Payer: Self-pay | Admitting: Gynecology

## 2011-03-25 DIAGNOSIS — K921 Melena: Secondary | ICD-10-CM

## 2011-03-31 ENCOUNTER — Encounter: Payer: Self-pay | Admitting: *Deleted

## 2011-03-31 NOTE — Progress Notes (Signed)
Sent copy of hemocult results to Dr. Collene Mares per patient request.

## 2011-03-31 NOTE — Progress Notes (Signed)
Patient ID: Katie Nunez, female   DOB: 11-Dec-1955, 56 y.o.   MRN: [id number]

## 2011-11-11 DIAGNOSIS — K224 Dyskinesia of esophagus: Secondary | ICD-10-CM | POA: Insufficient documentation

## 2011-11-11 DIAGNOSIS — R700 Elevated erythrocyte sedimentation rate: Secondary | ICD-10-CM | POA: Insufficient documentation

## 2011-11-11 DIAGNOSIS — J8410 Pulmonary fibrosis, unspecified: Secondary | ICD-10-CM | POA: Insufficient documentation

## 2011-11-15 ENCOUNTER — Other Ambulatory Visit (HOSPITAL_COMMUNITY): Payer: Self-pay | Admitting: Internal Medicine

## 2011-11-15 DIAGNOSIS — J849 Interstitial pulmonary disease, unspecified: Secondary | ICD-10-CM

## 2011-11-19 ENCOUNTER — Encounter (HOSPITAL_COMMUNITY): Payer: BC Managed Care – PPO

## 2011-11-19 DIAGNOSIS — L439 Lichen planus, unspecified: Secondary | ICD-10-CM | POA: Insufficient documentation

## 2011-11-19 DIAGNOSIS — G609 Hereditary and idiopathic neuropathy, unspecified: Secondary | ICD-10-CM | POA: Insufficient documentation

## 2011-11-19 DIAGNOSIS — M545 Low back pain: Secondary | ICD-10-CM | POA: Insufficient documentation

## 2011-11-19 DIAGNOSIS — K909 Intestinal malabsorption, unspecified: Secondary | ICD-10-CM | POA: Insufficient documentation

## 2011-11-22 ENCOUNTER — Telehealth: Payer: Self-pay | Admitting: *Deleted

## 2011-11-22 DIAGNOSIS — D219 Benign neoplasm of connective and other soft tissue, unspecified: Secondary | ICD-10-CM

## 2011-11-22 NOTE — Telephone Encounter (Signed)
(  Pt is aware you are out of the office) pt is due for annual this month and asked if she could have ultrasound same day? Ultrasound to check fibroids, pt had same day last year. Please advise

## 2011-11-23 NOTE — Telephone Encounter (Signed)
Patient may have ultrasound same day as annual appointment.

## 2011-11-24 NOTE — Telephone Encounter (Signed)
Order placed, appointment desk will call pt.

## 2011-12-28 ENCOUNTER — Ambulatory Visit (HOSPITAL_COMMUNITY)
Admission: RE | Admit: 2011-12-28 | Discharge: 2011-12-28 | Disposition: A | Discharge status: Home / Self Care | Payer: BC Managed Care – PPO | Source: Ambulatory Visit | Attending: Internal Medicine | Admitting: Internal Medicine

## 2011-12-28 DIAGNOSIS — J984 Other disorders of lung: Secondary | ICD-10-CM | POA: Insufficient documentation

## 2012-01-03 ENCOUNTER — Encounter: Payer: Self-pay | Admitting: Gynecology

## 2012-01-03 ENCOUNTER — Ambulatory Visit (INDEPENDENT_AMBULATORY_CARE_PROVIDER_SITE_OTHER): Payer: BC Managed Care – PPO

## 2012-01-03 ENCOUNTER — Ambulatory Visit (INDEPENDENT_AMBULATORY_CARE_PROVIDER_SITE_OTHER): Payer: BC Managed Care – PPO | Admitting: Gynecology

## 2012-01-03 VITALS — BP 152/100 | Ht 66.0 in | Wt 199.0 lb

## 2012-01-03 DIAGNOSIS — N852 Hypertrophy of uterus: Secondary | ICD-10-CM

## 2012-01-03 DIAGNOSIS — D259 Leiomyoma of uterus, unspecified: Secondary | ICD-10-CM

## 2012-01-03 DIAGNOSIS — N83339 Acquired atrophy of ovary and fallopian tube, unspecified side: Secondary | ICD-10-CM

## 2012-01-03 DIAGNOSIS — Z780 Asymptomatic menopausal state: Secondary | ICD-10-CM

## 2012-01-03 DIAGNOSIS — Z8639 Personal history of other endocrine, nutritional and metabolic disease: Secondary | ICD-10-CM

## 2012-01-03 DIAGNOSIS — D251 Intramural leiomyoma of uterus: Secondary | ICD-10-CM

## 2012-01-03 DIAGNOSIS — D219 Benign neoplasm of connective and other soft tissue, unspecified: Secondary | ICD-10-CM

## 2012-01-03 DIAGNOSIS — Z01419 Encounter for gynecological examination (general) (routine) without abnormal findings: Secondary | ICD-10-CM

## 2012-01-03 DIAGNOSIS — D252 Subserosal leiomyoma of uterus: Secondary | ICD-10-CM

## 2012-01-03 NOTE — Progress Notes (Signed)
Katie Nunez Jan 11, 1956 [id number]   History:    56 y.o.  for annual gyn exam with history of scleroderma, and Raynaud's syndrome and  and rheumatoid arthritis. She's been followed by her rheumatologist. Her primary physician has been monitoring  her hypertension and she did not take her medication today does lie her blood pressure was elevated when she came in today. She was otherwise asymptomatic. Review of her record indicated she had her last colonoscopy was in 2009 and benign polyps were noted. Her last bone density study was normal in September 2011. She is taking her calcium and vitamin D with osteoporosis prevention. Patient with history of fibroid uterus had ultrasound done today for monitoring any changes. Patient does suffer from vasomotor symptoms but has been reluctant to start hormone replacement therapy and her primary physician has recommended that she be placed on antidepressant agent and she has been reluctant as well. Patient's last mammogram was in October 2000 and in 12. Her last colonoscopy was in 2009. Her last bone density study was in 2011. She says she is up-to-date on her flu vaccine. Patient is postmenopausal with minimal vasomotor symptoms and not on hormone replacement therapy.  Patient's Pap smear November 2012 demonstrated atypical squamous cells of undetermined significance with no high-risk HPV. Followup Pap smear February 2013 normal.  Past medical history,surgical history, family history and social history were all reviewed and documented in the EPIC chart.  Gynecologic History Patient's last menstrual period was 11/15/2008. Contraception: post menopausal status Last Pap: February 2013 Results were: normal Last mammogram: 2012. Results were: normal  Obstetric History OB History    Grav Para Term Preterm Abortions TAB SAB Ect Mult Living   1 1 1       1      # Outc Date GA Lbr Len/2nd Wgt Sex Del Anes PTL Lv   1 TRM     M CS   Yes       ROS: A ROS was  performed and pertinent positives and negatives are included in the history.  GENERAL: No fevers or chills. HEENT: No change in vision, no earache, sore throat or sinus congestion. NECK: No pain or stiffness. CARDIOVASCULAR: No chest pain or pressure. No palpitations. PULMONARY: No shortness of breath, cough or wheeze. GASTROINTESTINAL: No abdominal pain, nausea, vomiting or diarrhea, melena or bright red blood per rectum. GENITOURINARY: No urinary frequency, urgency, hesitancy or dysuria. MUSCULOSKELETAL: No joint or muscle pain, no back pain, no recent trauma. DERMATOLOGIC: No rash, no itching, no lesions. ENDOCRINE: No polyuria, polydipsia, no heat or cold intolerance. No recent change in weight. HEMATOLOGICAL: No anemia or easy bruising or bleeding. NEUROLOGIC: No headache, seizures, numbness, tingling or weakness. PSYCHIATRIC: No depression, no loss of interest in normal activity or change in sleep pattern.     Exam: chaperone present  BP 152/100  Ht 5\' 6"  (1.676 m)  Wt 199 lb (90.266 kg)  BMI 32.12 kg/m2  LMP 11/15/2008  Body mass index is 32.12 kg/(m^2).  General appearance : Well developed well nourished female. No acute distress HEENT: Neck supple, trachea midline, no carotid bruits, no thyroidmegaly Lungs: Clear to auscultation, no rhonchi or wheezes, or rib retractions  Heart: Regular rate and rhythm, no murmurs or gallops Breast:Examined in sitting and supine position were symmetrical in appearance, no palpable masses or tenderness,  no skin retraction, no nipple inversion, no nipple discharge, no skin discoloration, no axillary or supraclavicular lymphadenopathy Abdomen: no palpable masses or tenderness, no rebound or guarding  Extremities: no edema or skin discoloration or tenderness  Pelvic:  Bartholin, Urethra, Skene Glands: Within normal limits             Vagina: No gross lesions or discharge  Cervix: No gross lesions or discharge  Uterus   anteverted irregular              Shape 6-8 cm size  non-tender and mobile  Adnexa  Without masses or tenderness  Anus and perineum  normal   Rectovaginal  normal sphincter tone without palpated masses or tenderness             Hemoccult cards provided     Assessment/Plan:  56 y.o. female for annual exam who was reminded to take her antihypertensive medication today. She will continue to follow with Dr. Boris Lown her rheumatologist in [facility]. Dr. [name] we'll continue to monitor for her hypertension. Review of her record indicated in 2011 she had a cecal polyp removed. She scheduled to see the gastroenterologist next month. She did have positive bloody stools last year on Hemoccult testing but it was attributed to straining and rectal mucosal tear. Patient has a history of CIN-1 treated with CO2 laser many years ago and subsequent Pap smears until last year had been normal. No Pap smear done today. She was encouraged to do her monthly self breast examination. We discussed importance of calcium and vitamin D for osteoporosis prevention. A bone density study was scheduled for next month. Requisition for to schedule her mammogram was provided as well.    Terrance Mass MD, 2:07 PM 01/03/2012

## 2012-01-03 NOTE — Patient Instructions (Addendum)
Schedule mammogram and follow up with Dr. Collene Mares GI

## 2012-01-24 ENCOUNTER — Encounter: Payer: Self-pay | Admitting: Gynecology

## 2012-11-21 ENCOUNTER — Telehealth: Payer: Self-pay | Admitting: Cardiology

## 2012-11-21 NOTE — Telephone Encounter (Signed)
Left message for pt, dr turner back in the office tomorrow. Will forward for her review.

## 2012-11-21 NOTE — Telephone Encounter (Signed)
New message     Pt want an echo.  She said she get it annually.  Pls ask Dr Radford Pax if she can get an echo.

## 2012-11-22 ENCOUNTER — Other Ambulatory Visit: Payer: Self-pay | Admitting: General Surgery

## 2012-11-22 DIAGNOSIS — I059 Rheumatic mitral valve disease, unspecified: Secondary | ICD-10-CM

## 2012-11-22 NOTE — Telephone Encounter (Signed)
Order put in system. Message sent to scheduling to set pt up with appt for ECHO.

## 2012-11-22 NOTE — Telephone Encounter (Signed)
Echo last year showed only mild MVP and mild MR therefore no indication to repeat echo this year.  Please set up echo in 1 year for followup of MVP/MR

## 2012-12-11 ENCOUNTER — Ambulatory Visit (HOSPITAL_COMMUNITY): Payer: BC Managed Care – PPO | Attending: Internal Medicine | Admitting: Radiology

## 2012-12-11 ENCOUNTER — Encounter: Payer: Self-pay | Admitting: Internal Medicine

## 2012-12-11 DIAGNOSIS — I059 Rheumatic mitral valve disease, unspecified: Secondary | ICD-10-CM | POA: Insufficient documentation

## 2012-12-11 NOTE — Progress Notes (Signed)
Echocardiogram performed.  

## 2012-12-27 ENCOUNTER — Other Ambulatory Visit (HOSPITAL_COMMUNITY): Payer: BC Managed Care – PPO

## 2013-04-22 ENCOUNTER — Encounter: Payer: Self-pay | Admitting: *Deleted

## 2013-04-26 ENCOUNTER — Encounter: Payer: Self-pay | Admitting: *Deleted

## 2013-08-29 ENCOUNTER — Other Ambulatory Visit: Payer: Self-pay | Admitting: Internal Medicine

## 2013-08-29 DIAGNOSIS — J329 Chronic sinusitis, unspecified: Secondary | ICD-10-CM

## 2013-08-31 ENCOUNTER — Other Ambulatory Visit: Payer: BC Managed Care – PPO

## 2013-09-05 ENCOUNTER — Ambulatory Visit (INDEPENDENT_AMBULATORY_CARE_PROVIDER_SITE_OTHER): Payer: BC Managed Care – PPO

## 2013-09-05 ENCOUNTER — Encounter: Payer: Self-pay | Admitting: Podiatry

## 2013-09-05 ENCOUNTER — Ambulatory Visit (INDEPENDENT_AMBULATORY_CARE_PROVIDER_SITE_OTHER): Payer: BC Managed Care – PPO | Admitting: Podiatry

## 2013-09-05 VITALS — BP 150/85 | HR 89 | Resp 15 | Ht 66.0 in | Wt 195.0 lb

## 2013-09-05 DIAGNOSIS — M722 Plantar fascial fibromatosis: Secondary | ICD-10-CM

## 2013-09-05 DIAGNOSIS — M79609 Pain in unspecified limb: Secondary | ICD-10-CM

## 2013-09-05 DIAGNOSIS — S92919A Unspecified fracture of unspecified toe(s), initial encounter for closed fracture: Secondary | ICD-10-CM

## 2013-09-05 DIAGNOSIS — M79671 Pain in right foot: Secondary | ICD-10-CM

## 2013-09-05 NOTE — Patient Instructions (Addendum)
Observe darkened area on the end of right great  toe if it has not disappeared 30 days consult dermatologist Wear athletic style shoes Limit standing walking to tolerance

## 2013-09-05 NOTE — Progress Notes (Signed)
   Subjective:    Patient ID: Katie Nunez, female    DOB: 24-Jul-1955, 58 y.o.   MRN: [id number]  HPI Comments: N toe injury L right 4th toe D 3 weeks ago O stumped toe on end of treadmill C numbness at time and fat-sensation, initially sore A worse at night T none  Pt complains of itching B/L heel plantar posterior, for 1 week and has used epsom salt soaks.  Pt states she's fallen several time in the last 3 weeks in a parklot, and inverted her feet when she landed.   Foot Pain Associated symptoms include chills, congestion, fatigue, numbness and weakness.      Review of Systems  Constitutional: Positive for chills and fatigue.  HENT: Positive for congestion and sinus pressure.   Gastrointestinal: Positive for diarrhea and constipation.  Endocrine: Positive for cold intolerance.  Neurological: Positive for weakness and numbness.  All other systems reviewed and are negative.      Objective:   Physical Exam  Orientated x3 black female  Vascular: DP and PT pulses 2/4 bilaterally  Neurological: Vibratory sensation intact bilaterally Ankle reflex equal and reactive bilaterally Sensation to 10 g monofilament wire intact 4/5 right and 5/5 left  Dermatological: Low-grade edema fourth right toe Distal right hallux is 3 mm darkened area with sharp borders (appears most likely with dried blood) 2 mm black circular area nonraised medial right arch presents for multiple years without any change of appearance  Musculoskeletal: HAV deformity left Hammertoe deformity second left Palpable tenderness inferior heel right X-ray demonstrates fracture distal phalanx fourth right toe with no displacement      Assessment & Plan:   Assessment: Fracture distal phalanx fourth right toe Plantar fasciitis right Nevus right midfoot Suspect dried blood distal right hallux  Plan: Patient advised that she has fractured distal phalanx fourth right toe. Recommend athletic style  shoe until all fourth right toe pain resolves For plantar fasciitis recommended athletics toe shoe and stretching Advised patient that if the distal darkened area on the right hallux is not resolve in the next 30 days present to dermatologist for further evaluation  Reappoint at patient's request

## 2013-09-06 ENCOUNTER — Encounter: Payer: Self-pay | Admitting: Podiatry

## 2013-09-10 ENCOUNTER — Ambulatory Visit (INDEPENDENT_AMBULATORY_CARE_PROVIDER_SITE_OTHER): Payer: BC Managed Care – PPO | Admitting: Family Medicine

## 2013-09-10 ENCOUNTER — Encounter: Payer: Self-pay | Admitting: Family Medicine

## 2013-09-10 VITALS — Ht 66.0 in | Wt 194.2 lb

## 2013-09-10 DIAGNOSIS — R7309 Other abnormal glucose: Secondary | ICD-10-CM | POA: Diagnosis not present

## 2013-09-10 NOTE — Patient Instructions (Signed)
  Diet Recommendations for Diabetes   Starchy (carb) foods include: Bread, rice, pasta, potatoes, corn, crackers, bagels, muffins, all baked goods.  (Fruits, milk, and yogurt also have carbohydrate, but most of these foods will not spike your blood sugar as the starchy foods will.)  A few fruits do cause high blood sugars; use small portions of bananas (limit to 1/2 at a time), grapes, watermelon, and most tropical fruits.    Protein foods include: Meat, fish, poultry, eggs, dairy foods, and beans such as pinto and kidney beans (beans also provide carbohydrate).   1. Eat at least 3 meals and 1-2 snacks per day. Never go more than 4-5 hours while awake without eating. Eat breakfast within the first hour of getting up.   2. Limit starchy foods to TWO per meal and ONE per snack. ONE portion of a starchy  food is equal to the following:   - ONE slice of bread (or its equivalent, such as half of a hamburger bun).   - 1/2 cup of a "scoopable" starchy food such as potatoes or rice.   - 15 grams of carbohydrate as shown on food label.  3. Include at every meal: a protein food, a carb food, and vegetables and/or fruit.   - Obtain twice as many veg's as protein or carbohydrate foods for both lunch and dinner.   - Fresh or frozen veg's are best.   - Try to keep frozen veg's on hand for a quick vegetable serving.     4. SODA is a great way to SPIKE your blood sugar!  It's best to limit all sources of liquid calories.  An alternative is to start with mixing juice with seltzer water.  Eventually, you may do fine with JUST flavored seltzer water (NO sweetener added).    - Bring your food records to follow-up on Oct 20.    - Recommended site:  [email or web address] and [email or web address].

## 2013-09-10 NOTE — Progress Notes (Signed)
Medical Nutrition Therapy:  Appt start time: 1100 end time:  1200.  Assessment:  Primary concerns today: Blood sugar control.  Katie Nunez is a very pleasant woman referred by Dr. [name], who diagnosed her with pre-DM last week.  She also suffers from scleroderma.   Learning Readiness:  Ready; pt seems motivated, although breaking soda habit will be challenging.   Barriers to learning/adherence to lifestyle change: Getting into the routine of structured mealtimes.    Usual eating pattern includes 2 meals and 2-3 snacks per day.  Identifies mindless eating as a problem; is usually home all day.  Her only son lives away now, and her husband, who is a Theme park manager, is usually gone during the day.    Usual physical activity includes none currently due to pain from scleroderma.  Sleep is poor b/c of scleroderma.    Frequent foods include 1 c coffee, 32-48 oz soda per day (some diet), chx, salmon, veg's.  Avoided foods include none.    24-hr recall (Sunday): (Up at 7 AM) B ( AM)-   water Snk ( AM)-   --- L (4 PM)-  6 oz salmon, 1 c stir-fried squash, 1/2 c asparagus, >1/2 c brn rice, 1/2 sweet potato, pinch turbinado sugar, 1/8 tsp butter, Coke Zero Snk ( PM)-  --- D ( PM)-  --- Snk (9 PM)-  1 c coffee, <1 tsp non-dairy creamer, 4 ginger snaps, few sips diet flavored water Typical day? Yes.   Seldom eats breakfast, although occasionally has 1 smoked sausage.    Progress Towards Goal(s):  In progress.   Nutritional Diagnosis:  [place]-5.8.3 Inappropriate intake of types of carbohydrates (specify):   As related to beverages.  As evidenced by usual intake of at least 36 o of soda daily.    Intervention:  Nutrition education.  Handouts given during visit include:  AVS  Recipes  Demonstrated degree of understanding via:  Teach Back   Monitoring/Evaluation:  Dietary intake, exercise, and body weight in 8 week(s).

## 2013-10-09 ENCOUNTER — Encounter: Payer: BC Managed Care – PPO | Attending: Family Medicine | Admitting: Dietician

## 2013-10-09 ENCOUNTER — Encounter: Payer: Self-pay | Admitting: Dietician

## 2013-10-09 VITALS — Ht 66.0 in | Wt 191.1 lb

## 2013-10-09 DIAGNOSIS — Z713 Dietary counseling and surveillance: Secondary | ICD-10-CM | POA: Diagnosis not present

## 2013-10-09 DIAGNOSIS — R7303 Prediabetes: Secondary | ICD-10-CM

## 2013-10-09 DIAGNOSIS — R7309 Other abnormal glucose: Secondary | ICD-10-CM | POA: Insufficient documentation

## 2013-10-09 NOTE — Progress Notes (Signed)
  Medical Nutrition Therapy:  Appt start time: [id number] end time:  1510.   Assessment:  Primary concerns today: Katie Nunez reports that she is here for a recent diagnosis of prediabetes. She saw Iver Nestle in August but requested a more in-depth appointment with a dietitian. 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 Her only child is in college. Katie Nunez currently stays at home. Her husband is a Theme park manager at a [employer]. She reports that she enjoys helping sick friends and neighbors. However, she would like to find a hobby to keep her busy. Has scleroderma, fibromyalgia, and rheumatoid arthritis. Doesn't sleep well, very light sleeper. She is on the parents council at [employer] where her son attends college. Has been trying to cut out regular sodas, switched to diet and trying to drink more water.   Preferred Learning Style:  No preference indicated   Learning Readiness:   Ready  MEDICATIONS: see list   DIETARY INTAKE:  Inconsistent eating pattern  Usual physical activity: occasional walking on treadmill  Estimated energy needs: 1500-1600 calories 170-180 g carbohydrates  Progress Towards Goal(s):  In progress.   Nutritional Diagnosis:  [place]-2.2 Altered nutrition-related laboratory As related to history of inconsistent eating pattern, large portion sizes, and physical inactivity.  As evidenced by HgbA1c 5.7%.    Intervention:  Nutrition counseling provided. Discussed CHO counting per patient request. Recommended ~30 grams per meal and 15 grams at snacks. Encouraged increased exercise as tolerated. Clarified many diet-related misconceptions.     Teaching Method Utilized:  Visual Auditory Hands on  Handouts given during visit include:  Meal planning card  MyPlate  [id number] CHO + protein snacks  Barriers to learning/adherence to lifestyle change: stress and chronic pain  Demonstrated degree of understanding via:  Teach Back   Monitoring/Evaluation:  Dietary intake,  exercise, and body weight in 4 week(s).

## 2013-10-09 NOTE — Patient Instructions (Addendum)
-  Choose higher fiber and lower sugar carbohydrates -Find something for stress relief  -Take time for yourself! -Exercise as tolerated (do something you enjoy!) -Look into water aerobics -Avoid skipping meals (have something to eat every 3-5 hours) -Pre portion snacks

## 2013-10-23 ENCOUNTER — Ambulatory Visit: Payer: BC Managed Care – PPO | Admitting: Dietician

## 2013-10-30 ENCOUNTER — Ambulatory Visit: Payer: BC Managed Care – PPO | Admitting: Family Medicine

## 2013-11-12 ENCOUNTER — Encounter: Payer: Self-pay | Admitting: Dietician

## 2013-11-19 ENCOUNTER — Ambulatory Visit: Payer: BC Managed Care – PPO | Admitting: Dietician

## 2013-11-26 ENCOUNTER — Encounter: Payer: BC Managed Care – PPO | Attending: Family Medicine | Admitting: Dietician

## 2013-11-26 VITALS — Wt 184.2 lb

## 2013-11-26 DIAGNOSIS — R7309 Other abnormal glucose: Secondary | ICD-10-CM | POA: Diagnosis not present

## 2013-11-26 DIAGNOSIS — R7303 Prediabetes: Secondary | ICD-10-CM

## 2013-11-26 DIAGNOSIS — Z713 Dietary counseling and surveillance: Secondary | ICD-10-CM | POA: Diagnosis not present

## 2013-11-26 NOTE — Patient Instructions (Signed)
-  Choose higher fiber and lower sugar carbohydrates -Find something for stress relief  -Take time for yourself! -Exercise as tolerated (do something you enjoy!) -Look into water aerobics -Avoid skipping meals (have something to eat every 3-5 hours) -Pre portion snacks

## 2013-11-26 NOTE — Progress Notes (Signed)
  Medical Nutrition Therapy:  Appt start time: 200 end time:  245.   Follow up:  Primary concerns today: Brionne returns stating that she has had a lack of energy due to fibromyalgia pain. However, she has lost 7 pounds since her last visit 6 weeks ago. She states she has been practicing reading food labels and avoiding processed and fried foods. Drinking diet soda instead of regular soda. She admits that she still needs to work on exercising and drinking more water.  Preferred Learning Style:  No preference indicated   Learning Readiness:   Ready  MEDICATIONS: see list   DIETARY INTAKE:  Inconsistent eating pattern  Usual physical activity: occasional walking on treadmill  Estimated energy needs: 1500-1600 calories 170-180 g carbohydrates  Progress Towards Goal(s):  In progress.   Nutritional Diagnosis:  [place]-2.2 Altered nutrition-related laboratory As related to history of inconsistent eating pattern, large portion sizes, and physical inactivity.  As evidenced by HgbA1c 5.7%.    Intervention:  Nutrition counseling provided. Discussed CHO counting per patient request. Recommended ~30 grams per meal and 15 grams at snacks. Encouraged increased exercise as tolerated. Clarified many diet-related misconceptions.     Teaching Method Utilized:  Visual Auditory Hands on  Handouts given during visit include:  Meal planning card  High fiber food list  Grocery store tips  Weight management tips  15g CHO + protein snacks  Barriers to learning/adherence to lifestyle change: stress and chronic pain  Demonstrated degree of understanding via:  Teach Back   Monitoring/Evaluation:  Dietary intake, exercise, and body weight in 6 week(s).

## 2014-01-07 ENCOUNTER — Encounter: Payer: BC Managed Care – PPO | Attending: Family Medicine | Admitting: Dietician

## 2014-01-07 VITALS — Wt 182.7 lb

## 2014-01-07 DIAGNOSIS — R7303 Prediabetes: Secondary | ICD-10-CM

## 2014-01-07 DIAGNOSIS — Z713 Dietary counseling and surveillance: Secondary | ICD-10-CM | POA: Insufficient documentation

## 2014-01-07 DIAGNOSIS — R7309 Other abnormal glucose: Secondary | ICD-10-CM | POA: Insufficient documentation

## 2014-01-07 NOTE — Progress Notes (Signed)
  Medical Nutrition Therapy:  Appt start time: 210 end time:  235   Follow up:  Primary concerns today: Katie Nunez returns having lost 2 pounds in the last 6 weeks despite tasting a lot of desserts over Thanksgiving and Christmas. She reports that she has given up regular sodas and now wants to stop drinking diet sodas. Adding protein foods to meals and snacks. Had lab work done recently but results are not available. Does not sleep well. Does physical therapy 1x a week and stretching when she is in pain.  Preferred Learning Style:  No preference indicated   Learning Readiness:   Ready  MEDICATIONS: see list   DIETARY INTAKE:  Inconsistent eating pattern  Usual physical activity: occasional walking on treadmill  Estimated energy needs: 1500-1600 calories 170-180 g carbohydrates  Progress Towards Goal(s):  In progress.   Nutritional Diagnosis:  [place]-2.2 Altered nutrition-related laboratory As related to history of inconsistent eating pattern, large portion sizes, and physical inactivity.  As evidenced by HgbA1c 5.7%.    Intervention:  Nutrition counseling provided. Discussed CHO counting per patient request. Recommended ~30 grams per meal and 15 grams at snacks. Encouraged increased exercise as tolerated. Clarified many diet-related misconceptions.     Teaching Method Utilized:  Visual Auditory Hands on  Samples provided and patient instructed on proper use: PB2 (qty 3) Lot#: [id number] Exp: 08/2014  Barriers to learning/adherence to lifestyle change: stress and chronic pain  Demonstrated degree of understanding via:  Teach Back   Monitoring/Evaluation:  Dietary intake, exercise, and body weight in 6 week(s).

## 2014-01-07 NOTE — Patient Instructions (Addendum)
-  Choose higher fiber and lower sugar carbohydrates -Find something for stress relief  -Take time for yourself! -Exercise as tolerated (do something you enjoy!) -Look into water aerobics -Avoid skipping meals (have something to eat every 3-5 hours) -Pre portion snacks  -Try sparkling water (add a splash of juice or lemon/lime juice)

## 2014-01-16 ENCOUNTER — Other Ambulatory Visit (HOSPITAL_COMMUNITY): Payer: Self-pay | Admitting: Respiratory Therapy

## 2014-01-16 DIAGNOSIS — I272 Pulmonary hypertension, unspecified: Secondary | ICD-10-CM

## 2014-01-29 ENCOUNTER — Encounter (HOSPITAL_COMMUNITY): Payer: Self-pay

## 2014-01-29 ENCOUNTER — Other Ambulatory Visit (HOSPITAL_COMMUNITY): Payer: Self-pay

## 2014-02-19 ENCOUNTER — Ambulatory Visit: Payer: BC Managed Care – PPO | Admitting: Dietician

## 2014-04-29 ENCOUNTER — Ambulatory Visit (INDEPENDENT_AMBULATORY_CARE_PROVIDER_SITE_OTHER): Payer: BLUE CROSS/BLUE SHIELD | Admitting: Podiatry

## 2014-04-29 ENCOUNTER — Ambulatory Visit (INDEPENDENT_AMBULATORY_CARE_PROVIDER_SITE_OTHER): Payer: BLUE CROSS/BLUE SHIELD

## 2014-04-29 ENCOUNTER — Encounter: Payer: Self-pay | Admitting: Podiatry

## 2014-04-29 VITALS — BP 159/90 | HR 72 | Resp 12

## 2014-04-29 DIAGNOSIS — M2042 Other hammer toe(s) (acquired), left foot: Secondary | ICD-10-CM

## 2014-04-29 DIAGNOSIS — M2012 Hallux valgus (acquired), left foot: Secondary | ICD-10-CM | POA: Diagnosis not present

## 2014-04-29 NOTE — Progress Notes (Signed)
   Subjective:    Patient ID: Katie Nunez, female    DOB: 02-27-1955, 59 y.o.   MRN: [id number]  HPI  N-SORE L-LT FOOT BUNION AND 2ND TOE D-2 WEEKS O-SLOWLY C-WORSE A-WEARING CERTAIN SHOES T-CUSHION   Patient has difficulty tolerating close shoes because a painful second left toe that she self treating with the cushion.  Review of Systems  HENT: Positive for sinus pressure.        Objective:   Physical Exam  Orientated 3  Vascular: DP and PT pulses 2/4 bilaterally Capillary reflex immediate bilaterally No edema noted bilaterally  Neurological: Sensation to 10 g monofilament wire intact 5/5 bilaterally Vibratory sensation intact bilaterally Ankle reflex equal and reactive bilaterally  Dermatological: Small keratoses dorsal proximal interphalangeal joint second left toe Mild keratoses medial left hallux interphalangeal joint  Musculoskeletal: HAV deformity left that is not track bound There is no crepitus in left first MPJ Flexible hammer toe deformity second left  X-ray examination weightbearing left foot  Intact bony structure without fracture and/or dislocation No arthritic changes noted Bone density appears to be adequate HAV deformity Hammertoe deformity second  Radiographic impression: No acute bony abnormality noted left foot    Assessment & Plan:   Assessment: Satisfactory neurovascular History of rheumatoid arthritis and scleroderma HAV deformity left Hammertoe deformity second left  Plan: I discussed with patient today in detail the origin of the hammertoe most likely associated with the HAV deformity. At this time because patient is having minimal symptoms I'm recommending additional protective padding an accommodative shoe modification.  If symptoms became progressively more painful and even soft shoes became difficult to wear would reconsider further evaluation and other possible treatments. Please note because patient has  significant autoimmune disorders surgical treatment should be deferred unless there was compelling reason.   Patient given information about HAV and hammertoe deformities in the after visit summaries  Reappoint at patient's request

## 2014-04-29 NOTE — Patient Instructions (Signed)
Hammer Toes Hammer toes is a condition in which one or more of your toes is permanently flexed. CAUSES  This happens when a muscle imbalance or abnormal bone length makes your small toes buckle. This causes the toe joint to contract and the strong cord-like bands that attach muscles to the bones (tendons) in your toes to shorten.  SIGNS AND SYMPTOMS  Common symptoms of flexible hammer toes include:   A buildup of skin cells (corns). Corns occur where boney bumps come in frequent contact with hard surfaces. For example, where your shoes press and rub.  Irritation.  Inflammation.  Pain.  Limited motion in your toes. DIAGNOSIS  Hammer toes are diagnosed through a physical exam of your toes. During the exam, your health care provider may try to reproduce your symptoms by manipulating your foot. Often, X-ray exams are done to determine the degree of deformity and to make sure that the cause is not a fracture.  TREATMENT  Hammer toes can be treated with corrective surgery. There are several types of surgical procedures that can treat hammer toes. The most common procedures include:  Arthroplasty--A portion of the joint is surgically removed and your toe is straightened. The gap fills in with fibrous tissue. This procedure helps treat pain and deformity and helps restore function.  Fusion--Cartilage between the two bones of the affected joint is taken out and the bones fuse together into one longer bone. This helps keep your toe stable and reduces pain but leaves your toe stiff, yet straight.  Implantation--A portion of your bone is removed and replaced with an implant to restore motion.  Flexor tendon transfers--This procedure repositions the tendons that curl the toes down (flexor tendons). This may be done to release the deforming force that causes your toe to buckle. Several of these procedures require fixing your toe with a pin that is visible at the tip of your toe. The pin keeps the toe  straight during healing. Your health care provider will remove the pin usually within 4-8 weeks after the procedure.  Document Released: 12/26/1999 Document Revised: 01/02/2013 Document Reviewed: 09/04/2012 [facility] Patient Information 2015 [place], [place]. This information is not intended to replace advice given to you by your health care provider. Make sure you discuss any questions you have with your health care provider. Bunion (Hallux Valgus) A bony bump (protrusion) on the inside of the foot, at the base of the first toe, is called a bunion (hallux valgus). A bunion causes the first toe to angle toward the other toes. SYMPTOMS   A bony bump on the inside of the foot, causing an outward turning of the first toe. It may also overlap the second toe.  Thickening of the skin (callus) over the bony bump.  Fluid buildup under the callus. Fluid may become red, tender, and swollen (inflamed) with constant irritation or pressure.  Foot pain and stiffness. CAUSES  Many causes exist, including:  Inherited from your family (genetics).  Injury (trauma) forcing the first toe into a position in which it overlaps other toes.  Bunions are also associated with wearing shoes that have a narrow toe box (pointy shoes). RISK INCREASES WITH:  Family history of foot abnormalities, especially bunions.  Arthritis.  Narrow shoes, especially high heels. PREVENTION  Wear shoes with a wide toe box.  Avoid shoes with high heels.  Wear a small pad between the big toe and second toe.  Maintain proper conditioning:  Foot and ankle flexibility.  Muscle strength and endurance. PROGNOSIS  With proper treatment, bunions can typically be cured. Occasionally, surgery is required.  RELATED COMPLICATIONS   Infection of the bunion.  Arthritis of the first toe.  Risks of surgery, including infection, bleeding, injury to nerves (numb toe), recurrent bunion, overcorrection (toe points inward), arthritis of  the big toe, big toe pointing upward, and bone not healing. TREATMENT  Treatment first consists of stopping the activities that aggravate the pain, taking pain medicines, and icing to reduce inflammation and pain. Wear shoes with a wide toe box. Shoes can be modified by a shoe repair person to relieve pressure on the bunion, especially if you cannot find shoes with a wide enough toe box. You may also place a pad with the center cut out in your shoe, to reduce pressure on the bunion. Sometimes, an arch support (orthotic) may reduce pressure on the bunion and alleviate the symptoms. Stretching and strengthening exercises for the muscles of the foot may be useful. You may choose to wear a brace or pad at night to hold the big toe away from the second toe. If non-surgical treatments are not successful, surgery may be needed. Surgery involves removing the overgrown tissue and correcting the position of the first toe, by realigning the bones. Bunion surgery is typically performed on an outpatient basis, meaning you can go home the same day as surgery. The surgery may involve cutting the mid portion of the bone of the first toe, or just cutting and repairing (reconstructing) the ligaments and soft tissues around the first toe.  MEDICATION   If pain medicine is needed, nonsteroidal anti-inflammatory medicines, such as aspirin and ibuprofen, or other minor pain relievers, such as acetaminophen, are often recommended.  Do not take pain medicine for 7 days before surgery.  Prescription pain relievers are usually only prescribed after surgery. Use only as directed and only as much as you need.  Ointments applied to the skin may be helpful. HEAT AND COLD  Cold treatment (icing) relieves pain and reduces inflammation. Cold treatment should be applied for 10 to 15 minutes every 2 to 3 hours for inflammation and pain and immediately after any activity that aggravates your symptoms. Use ice packs or an ice  massage.  Heat treatment may be used prior to performing the stretching and strengthening activities prescribed by your caregiver, physical therapist, or athletic trainer. Use a heat pack or a warm soak. SEEK MEDICAL CARE IF:   Symptoms get worse or do not improve in 2 weeks, despite treatment.  After surgery, you develop fever, increasing pain, redness, swelling, drainage of fluids, bleeding, or increasing warmth around the surgical area.  New, unexplained symptoms develop. (Drugs used in treatment may produce side effects.) Document Released: 12/28/2004 Document Revised: 03/22/2011 Document Reviewed: 04/11/2008 [facility] Patient Information 2015 [place], [place]. This information is not intended to replace advice given to you by your health care provider. Make sure you discuss any questions you have with your health care provider.

## 2014-05-27 ENCOUNTER — Ambulatory Visit (HOSPITAL_COMMUNITY)
Admission: RE | Admit: 2014-05-27 | Discharge: 2014-05-27 | Disposition: A | Discharge status: Home / Self Care | Payer: BLUE CROSS/BLUE SHIELD | Source: Ambulatory Visit | Attending: Family Medicine | Admitting: Family Medicine

## 2014-05-27 ENCOUNTER — Other Ambulatory Visit (HOSPITAL_COMMUNITY): Payer: Self-pay | Admitting: Family Medicine

## 2014-05-27 DIAGNOSIS — I809 Phlebitis and thrombophlebitis of unspecified site: Secondary | ICD-10-CM

## 2014-05-27 DIAGNOSIS — I82812 Embolism and thrombosis of superficial veins of left lower extremities: Secondary | ICD-10-CM | POA: Insufficient documentation

## 2014-05-27 DIAGNOSIS — M79609 Pain in unspecified limb: Secondary | ICD-10-CM | POA: Diagnosis present

## 2014-05-27 NOTE — Progress Notes (Signed)
VASCULAR LAB PRELIMINARY  PRELIMINARY  PRELIMINARY  PRELIMINARY  Left lower extremity venous duplex completed.    Preliminary report:  No evidence of left lower extremity deep vein thrombosis. Positive for superficial thrombosis of the left greater saphenous coursing from the knee to mid thigh. No evidence of a Baker's cyst  Gunther Zawadzki, RVS 05/27/2014, 6:00 PM

## 2014-06-20 ENCOUNTER — Other Ambulatory Visit: Payer: Self-pay | Admitting: *Deleted

## 2014-06-20 DIAGNOSIS — I809 Phlebitis and thrombophlebitis of unspecified site: Secondary | ICD-10-CM

## 2014-07-12 HISTORY — PX: OTHER SURGICAL HISTORY: SHX169

## 2014-07-19 ENCOUNTER — Encounter: Payer: Self-pay | Admitting: Vascular Surgery

## 2014-07-23 ENCOUNTER — Inpatient Hospital Stay (HOSPITAL_COMMUNITY): Admission: RE | Admit: 2014-07-23 | Payer: BLUE CROSS/BLUE SHIELD | Source: Ambulatory Visit

## 2014-07-23 ENCOUNTER — Ambulatory Visit (INDEPENDENT_AMBULATORY_CARE_PROVIDER_SITE_OTHER): Payer: BLUE CROSS/BLUE SHIELD | Admitting: Vascular Surgery

## 2014-07-23 ENCOUNTER — Encounter: Payer: Self-pay | Admitting: Vascular Surgery

## 2014-07-23 VITALS — BP 109/61 | HR 74 | Resp 16 | Ht 65.0 in | Wt 176.3 lb

## 2014-07-23 DIAGNOSIS — I809 Phlebitis and thrombophlebitis of unspecified site: Secondary | ICD-10-CM

## 2014-07-23 NOTE — Progress Notes (Signed)
Referred by:  [name], MD [place], [place] [address]  Reason for referral: Superficial thrombophlebitis  History of Present Illness  Katie Nunez is a 59 y.o. (20-Sep-1955) female who presents for evaluation of left leg superficial thrombophlebitis. The patient previously noted erythema and tenderness to her left medial leg abruptly about two months ago. She reported to her PCP, Dr. Tamala Julian who referred her here given her past medical history which includes scleroderma, raynaud's, rheumatoid arthritis and fibromyalgia. The patient says her pain is now resolved. She denies any prior history of DVT.   She has a family history of cerebral aneurysm with her mother.   Past Medical History  Diagnosis Date  . Dysplasia of cervix, low grade (CIN 1)   . Scleroderma   . Raynaud's disease   . MVP (mitral valve prolapse)   . IBS (irritable bowel syndrome)   . RA (rheumatoid arthritis)     FOLLOWED BY DR. SHANAHAN  . Seasonal allergies   . Hypertension   . Achalasia   . Vitamin D deficiency   . Fibromyalgia   . Tubular adenoma 01/08/2008    CECUM  . Allergy     Past Surgical History  Procedure Laterality Date  . Cesarean section    . Ankle surgery    . Myomectomy    . Pelvic laparoscopy  2011  . Co2 laser of cervix    . Colonoscopy w/ biopsies  01/08/2008    History   Social History  . Marital Status: Married    Spouse Name: N/A  . Number of Children: N/A  . Years of Education: N/A   Occupational History  . Not on file.   Social History Main Topics  . Smoking status: Never Smoker   . Smokeless tobacco: Never Used  . Alcohol Use: No  . Drug Use: No  . Sexual Activity: Yes    Birth Control/ Protection: Post-menopausal   Other Topics Concern  . Not on file   Social History Narrative    Family History  Problem Relation Age of Onset  . Hypertension Mother   . Diabetes Mother   . Heart disease Father   . Hypertension Maternal  Aunt   . Diabetes Maternal Grandmother   . Heart disease Paternal Grandfather   . Cerebral palsy Cousin     1ST COUSIN?  . Diabetes Paternal Grandmother      Current Outpatient Prescriptions on File Prior to Visit  Medication Sig Dispense Refill  . Acetaminophen (TYLENOL PO) Take by mouth.    . Azelastine-Fluticasone (DYMISTA NA) Place into the nose.    . B-12, METHYLCOBALAMIN, SL Place under the tongue.    . calcium carbonate (OS-CAL) 600 MG TABS Take 600 mg by mouth 2 (two) times daily with a meal.      . cholecalciferol (VITAMIN D) 1000 UNITS tablet Take 4,000 Units by mouth 1 day or 1 dose.     . clonazePAM (KLONOPIN) 0.5 MG tablet Take 0.5 mg by mouth 2 (two) times daily as needed.      . cyclobenzaprine (FLEXERIL) 10 MG tablet Take 10 mg by mouth 3 (three) times daily as needed for muscle spasms.    Marland Kitchen esomeprazole (NEXIUM) 40 MG capsule Take 40 mg by mouth daily at 12 noon.    . GuaiFENesin (MUCINEX PO) Take 400 mg by mouth.    . hyoscyamine (LEVSIN SL) 0.125 MG SL tablet Place 0.125 mg under the tongue every 4 (  four) hours as needed.    . IBUPROFEN PO Take by mouth.    . Naproxen Sodium (ALEVE PO) Take by mouth.    . olmesartan-hydrochlorothiazide (BENICAR HCT) 40-12.5 MG per tablet Take 1 tablet by mouth daily.    . Probiotic Product (PROBIOTIC DAILY PO) Take by mouth.    Marland Kitchen amLODipine (NORVASC) 10 MG tablet Take 10 mg by mouth daily.       No current facility-administered medications on file prior to visit.    Allergies  Allergen Reactions  . Savella [Milnacipran Hcl]     REVIEW OF SYSTEMS:  (Positives checked otherwise negative)  CARDIOVASCULAR:  []  chest pain, []  chest pressure, []  palpitations, []  shortness of breath when laying flat, []  shortness of breath with exertion,  []  pain in feet when walking, []  pain in feet when laying flat, []  history of blood clot in veins (DVT), []  history of phlebitis, []  swelling in legs, []  varicose veins  PULMONARY:  []  productive  cough, []  asthma, []  wheezing  NEUROLOGIC:  [x]  weakness in arms or legs, []  numbness in arms or legs, []  difficulty speaking or slurred speech, []  temporary loss of vision in one eye, []  dizziness  HEMATOLOGIC:  []  bleeding problems, []  problems with blood clotting too easily  MUSCULOSKEL:  []  joint pain, []  joint swelling  GASTROINTEST:  []  vomiting blood, []  blood in stool     GENITOURINARY:  []  burning with urination, []  blood in urine  PSYCHIATRIC:  []  history of major depression  INTEGUMENTARY:  []  rashes, []  ulcers  CONSTITUTIONAL:  []  fever, [x]  chills   Physical Examination Filed Vitals:   07/23/14 1240  BP: 109/61  Pulse: 74  Resp: 16  Height: 5\' 5"  (1.651 m)  Weight: 176 lb 4.8 oz (79.969 kg)  SpO2: 96%   Body mass index is 29.34 kg/(m^2).  General: A&O x 3, WDWN female in NAD  Head: [place]/AT  Neck: Supple, no nuchal rigidity, no palpable LAD  Pulmonary: Non labored respiratory effort.   Vascular: Palpable dorsalis pedis and posterior tibial pulses bilaterally. Palpable cord to left medial knee. Slightly dilated left great saphenous vein. Thrombosed left great saphenous vein from mid thigh to mid calf with thrombosis of side branches. No significant varicosities. Right great saphenous vein is normal.   Musculoskeletal: Extremities without ischemic changes.   Neurologic: No focal deficitis  Psychiatric: Judgment intact, Mood & affect appropriate for pt's clinical situation  Outside Studies/Documentation 7 pages of outside documents were reviewed including: medical records from PCP.  Medical Decision Making  Katie Nunez is a 59 y.o. female who presents with superficial thrombophlebitis of her left great saphenous vein. Her legs were examined with ultrasound that revealed thrombosis of her left great saphenous vein from mid thigh to mid calf with thrombosis of side branches in that region. Her pain is now resolved. Assured patient that is a self-limiting  condition. Discussed that there may be a rare chance of reoccurrence. No indications for anticoagulation, further workup or treatment. If she continues to have pain, recommended treatment with ibuprofen. She will follow up on an as needed basis.   Virgina Jock, PA-C [facility] Office: [phone] Pager: [phone]  07/23/2014, 1:20 PM  This patient was seen and examined in conjunction with Dr. Donnetta Hutching.  I have examined the patient, reviewed and agree with above. I reimaged her veins with SonoSite and she does have thrombus in her great saphenous vein from proximal thigh to mid calf.  Pulses has several large tributary varicosities arising from this which were all thrombosed. Explained this is any increased risk. Explained this is self-limiting and that we will continue to resolve. She does have discoloration over the superficial varicosities and explained that this she may have some persistent brown staining over this area. She was reassured with this discussion will see [place] again on as-needed basis  Curt Jews, MD 07/23/2014 1:39 PM

## 2014-10-10 ENCOUNTER — Other Ambulatory Visit (HOSPITAL_COMMUNITY): Payer: Self-pay | Admitting: Respiratory Therapy

## 2014-10-10 DIAGNOSIS — M349 Systemic sclerosis, unspecified: Secondary | ICD-10-CM

## 2014-10-14 ENCOUNTER — Ambulatory Visit (HOSPITAL_COMMUNITY)
Admission: RE | Admit: 2014-10-14 | Discharge: 2014-10-14 | Disposition: A | Discharge status: Home / Self Care | Payer: BLUE CROSS/BLUE SHIELD | Source: Ambulatory Visit | Attending: Family Medicine | Admitting: Family Medicine

## 2014-10-14 DIAGNOSIS — M349 Systemic sclerosis, unspecified: Secondary | ICD-10-CM | POA: Insufficient documentation

## 2014-10-14 MED ORDER — ALBUTEROL SULFATE (2.5 MG/3ML) 0.083% IN NEBU
2.5000 mg | INHALATION_SOLUTION | Freq: Once | RESPIRATORY_TRACT | Status: AC
  Administered 2014-10-14: 2.5 mg via RESPIRATORY_TRACT

## 2014-10-15 LAB — PULMONARY FUNCTION TEST
DL/VA % pred: 99 %
DL/VA: 4.88 ml/min/mmHg/L
DLCO unc % pred: 75 %
DLCO unc: 19.33 ml/min/mmHg
FEF 25-75 Post: 2.38 L/sec
FEF 25-75 Pre: 1.71 L/sec
FEF2575-%Change-Post: 39 %
FEF2575-%Pred-Post: 105 %
FEF2575-%Pred-Pre: 75 %
FEV1-%Change-Post: 6 %
FEV1-%PRED-PRE: 91 %
FEV1-%Pred-Post: 97 %
FEV1-PRE: 2.06 L
FEV1-Post: 2.2 L
FEV1FVC-%Change-Post: 7 %
FEV1FVC-%Pred-Pre: 97 %
FEV6-%CHANGE-POST: 0 %
FEV6-%PRED-PRE: 95 %
FEV6-%Pred-Post: 94 %
FEV6-PRE: 2.65 L
FEV6-Post: 2.62 L
FEV6FVC-%PRED-PRE: 103 %
FEV6FVC-%Pred-Post: 103 %
FVC-%Change-Post: 0 %
FVC-%Pred-Post: 92 %
FVC-%Pred-Pre: 92 %
FVC-POST: 2.62 L
FVC-Pre: 2.65 L
POST FEV6/FVC RATIO: 100 %
PRE FEV6/FVC RATIO: 100 %
Post FEV1/FVC ratio: 84 %
Pre FEV1/FVC ratio: 78 %
RV % PRED: 88 %
RV: 1.73 L
TLC % PRED: 85 %
TLC: 4.43 L

## 2014-11-20 ENCOUNTER — Ambulatory Visit (INDEPENDENT_AMBULATORY_CARE_PROVIDER_SITE_OTHER): Payer: BLUE CROSS/BLUE SHIELD | Admitting: Allergy and Immunology

## 2014-11-20 ENCOUNTER — Encounter: Payer: Self-pay | Admitting: Allergy and Immunology

## 2014-11-20 VITALS — BP 100/76 | HR 80 | Temp 98.1°F | Resp 20

## 2014-11-20 DIAGNOSIS — J309 Allergic rhinitis, unspecified: Secondary | ICD-10-CM | POA: Diagnosis not present

## 2014-11-20 DIAGNOSIS — J3089 Other allergic rhinitis: Secondary | ICD-10-CM

## 2014-11-20 DIAGNOSIS — J310 Chronic rhinitis: Secondary | ICD-10-CM

## 2014-11-20 MED ORDER — LEVOCETIRIZINE DIHYDROCHLORIDE 5 MG PO TABS: 5.0000 mg | ORAL_TABLET | Freq: Every day | ORAL | Status: DC

## 2014-11-20 MED ORDER — OLOPATADINE HCL 0.6 % NA SOLN: NASAL | Status: DC

## 2014-11-20 NOTE — Patient Instructions (Signed)
Take Home Sheet  1. Avoidance: Mite, Mold and Pollen.  2. Antihistamine: Xyzal 5mg  by mouth once daily for runny nose or itching as needed.  3. Nasal Spray: Rhinocort 1-2 spray(s) each nostril once daily for stuffy nose or drainage.   4.  Patanase 1-2 sprays each nostril once each evening.  5.  Prednisone 30mg  at home.  6.  Keep diary of worsening symptoms-- environment, exposure, ingestion and activity.  7. Nasal Saline wash followed by nasal spray twice daily as directed.  8. Follow up Visit: 1-2 months or sooner if needed.  Websites that have reliable Patient information: 1. American Academy of Asthma, Allergy, & Immunology: www.aaaai.org 2. Food Allergy Network: www.foodallergy.org 3. Mothers of Asthmatics: www.aanma.org 4. [place]: DiningCalendar.de 5. American College of Allergy, Asthma, & Immunology: https://robertson.info/ or www.acaai.org

## 2014-11-21 ENCOUNTER — Telehealth: Payer: Self-pay

## 2014-11-21 NOTE — Telephone Encounter (Addendum)
Patient wants a 36 script sent to pharmacy for Rhinocort even though it is over the counter.  She states that her insurance told her it will pay for it since she has met her deductible.

## 2014-11-26 ENCOUNTER — Telehealth: Payer: Self-pay

## 2014-11-26 NOTE — Telephone Encounter (Signed)
Send 90 day RX

## 2014-11-26 NOTE — Telephone Encounter (Signed)
Patient called, and I informed her that we sent in a refill on her meds.

## 2014-11-26 NOTE — Telephone Encounter (Signed)
Called in 90 day supply for Rhinocort to pharmacy and notified patient.

## 2014-12-01 NOTE — Progress Notes (Addendum)
FOLLOW UP NOTE  RE: BRINLYNN CORBRIDGE MRN: [id number] DOB: 1955-10-20 [facility] [place] [address] Date of Office Visit: 11/20/2014  Subjective:  Katie Nunez is a 59 y.o. female who presents today for Nasal Congestion; Cough; and Ear Problem  Assessment:   1. Mixed rhinitis.   2.    Intermittent cough secondary to #1.   Plan:   Meds ordered this encounter  Medications  . levocetirizine (XYZAL) 5 MG tablet    Sig: Take 1 tablet (5 mg total) by mouth daily.    Dispense:  90 tablet    Refill:  2  . Olopatadine HCl 0.6 % SOLN    Sig: Use 1-2 sprays in each nostril once daily in the evening for congestion.    Dispense:  3 Bottle    Refill:  2   Patient Instructions   1. Avoidance: Mite, Mold and Pollen. 2. Antihistamine: Xyzal 5mg  by mouth once daily for runny nose or itching as needed. 3. Nasal Spray: Rhinocort 1-2 spray(s) each nostril once daily for stuffy nose or drainage.  4.  Patanase 1-2 sprays each nostril once each evening. 5.  Prednisone 30mg  at home. 6.  Keep diary of worsening symptoms-- environment, exposure, ingestion and activity 7.  Nasal Saline wash followed by nasal spray twice daily as directed. 8. Follow up Visit: 1-2 months or sooner if needed.   HPI: [name] returns to the office in follow-up of mixed rhinitis and intermittent cough, though unclear why didnot follow-up after her initial visit July 2015.  She describes nasal sprays helpful but only using intermittently as symptoms seem to fluctuate.  She notes increased nasal congestion with occasional hock and spit of clear mucus.  She has rare cough without discolored drainage, fever, sore throat, chest congestion, shortness of breath, disrupted sleep or activity.  She reports receiving antibiotics from Dr. Ernesto Rutherford and primary MD with some improvement.  No recent CT scan and is not interested in immunotherapy.   She believes she had used Xyzal which was beneficial.  No other medical issues, denies ED or urgent care visits.  Current Medications: 1.  Flonase 1-2 sprays each nostril daily as needed. 2.  Mucinex as needed. 3.  Continues Metrogel, Benicar, Nexium, Flexeril, clonazepam, vit D, B12, Norvasc, Levsin, Probiotic daily and as needed Tylenol and ibuprofen.  Drug Allergies: Allergies  Allergen Reactions  . Savella [Milnacipran Hcl]     Objective:   Filed Vitals:   11/20/14 1546  BP: 100/76  Pulse: 80  Temp: 98.1 F (36.7 C)  Resp: 20   Physical Exam  Constitutional: She is well-developed, well-nourished, and in no distress.  Speaking with slightly congested voice  HENT:  Head: Atraumatic.  Right Ear: Tympanic membrane and ear canal normal.  Left Ear: Tympanic membrane and ear canal normal.  Nose: Mucosal edema present. No rhinorrhea. No epistaxis.  Mouth/Throat: Oropharynx is clear and moist and mucous membranes are normal. No oropharyngeal exudate, posterior oropharyngeal edema or posterior oropharyngeal erythema.  Neck: Neck supple.  Cardiovascular: Normal rate, S1 normal and S2 normal.   No murmur heard. Pulmonary/Chest: Effort normal. She has no wheezes. She has no rhonchi. She has no rales.  Lymphadenopathy:    She has no cervical adenopathy.      Abbiegail Landgren M. Ishmael Holter, MD  cc: [name], MD

## 2014-12-19 ENCOUNTER — Telehealth: Payer: Self-pay | Admitting: Vascular Surgery

## 2014-12-19 NOTE — Telephone Encounter (Signed)
Katie Nunez needs bilateral reflux study and TFE. Not emergent but she is wondering why she still has pain in the left leg and says the right is feeling how the left used to. So I guess she's a vv fu? Unless you think she's a 30 min. Thx  LM for pt re appt, dpm

## 2014-12-26 ENCOUNTER — Ambulatory Visit (HOSPITAL_COMMUNITY)
Admission: RE | Admit: 2014-12-26 | Discharge: 2014-12-26 | Disposition: A | Discharge status: Home / Self Care | Payer: BLUE CROSS/BLUE SHIELD | Source: Ambulatory Visit | Attending: Vascular Surgery | Admitting: Vascular Surgery

## 2014-12-26 DIAGNOSIS — I809 Phlebitis and thrombophlebitis of unspecified site: Secondary | ICD-10-CM | POA: Diagnosis not present

## 2015-01-07 ENCOUNTER — Encounter: Payer: Self-pay | Admitting: Vascular Surgery

## 2015-01-08 ENCOUNTER — Ambulatory Visit (INDEPENDENT_AMBULATORY_CARE_PROVIDER_SITE_OTHER): Payer: BLUE CROSS/BLUE SHIELD | Admitting: Allergy and Immunology

## 2015-01-08 VITALS — BP 120/70 | HR 76 | Temp 98.0°F | Resp 20

## 2015-01-08 DIAGNOSIS — R05 Cough: Secondary | ICD-10-CM

## 2015-01-08 DIAGNOSIS — R059 Cough, unspecified: Secondary | ICD-10-CM

## 2015-01-08 DIAGNOSIS — J310 Chronic rhinitis: Secondary | ICD-10-CM | POA: Diagnosis not present

## 2015-01-08 NOTE — Progress Notes (Signed)
     FOLLOW UP NOTE  RE: Katie Nunez MRN: [id number] DOB: 09-10-1955 [facility] [place] [address]  [id number] Date of Office Visit: 01/08/2015  Subjective:  Katie Nunez is a 59 y.o. female who presents today for Nasal Congestion  Assessment:   1. Mixed rhinitis, with intermittent symptoms now s/p 2 recent courses of antibiotics.  2. Cough secondary to #1.  3.      Unclear Patient interest in medical management or further diagnostic evaluation. Plan:   Patient Instructions  1.  Discontinue Rhinocort and Patanase. 2.  Continue Xyzal 5 mg daily. 3.  Dymista 1 spray twice daily after saline nasal wash. 4.  Use saline nasal wash each morning in the shower. 5.  Mucinex one tablet once daily. 6.  Information on allergy injections. 7.  Obtain sinus CT scan--limited coronal views. 8.  Follow-up by phone with results and further management,  as have reviewed with Anne Ng, the long-term importance of minimizing her recurring courses of antibiotics.  HPI:    Katie Nunez returns to the office in follow-up of mixed rhinitis and cough , where we reinitiated additional medication regime, on November 9th. She states medications have given her marginal benefit and when the congestion, pressure/fullness increased, she was evaluated by another physician and received antibiotic in late November,  2 courses, which improved the postnasal drip associated cough significantly. She notices a sensation of hock ( but is usually not clearing much mucus or phlegm).  There is no fever, headache, sore throat, discolored drainage, or specific sinus pressure. She now feels about "50% good".  Denies difficulty breathing, shortness of breath or chest tightness.  Denies ED or urgent care visits, prednisone courses. Reports sleep and activity are normal.  Current Medications: 1.   Rhinocort AQ 1-2 sprays once daily. 2.   Xyzal 5 mg daily. 3.   Patanase 1-2 sprays once  daily.  Drug Allergies: Allergies  Allergen Reactions  . Savella [Milnacipran Hcl]    Objective:   Filed Vitals:   01/09/15 0845  BP: 120/70  Pulse: 76  Temp: 98 F (36.7 C)  Resp: 20    Physical Exam  Constitutional: She is well-developed, well-nourished, and in no distress.  HENT:  Head: Atraumatic.  Right Ear: Tympanic membrane and ear canal normal.  Left Ear: Tympanic membrane and ear canal normal.  Nose: Mucosal edema present. No rhinorrhea. No epistaxis.  Mouth/Throat: Oropharynx is clear and moist and mucous membranes are normal. No oropharyngeal exudate, posterior oropharyngeal edema or posterior oropharyngeal erythema.  Neck: Neck supple.  Cardiovascular: Normal rate, S1 normal and S2 normal.   No murmur heard. Pulmonary/Chest: Effort normal. She has no wheezes. She has no rhonchi. She has no rales.  Lymphadenopathy:    She has no cervical adenopathy.      Yasamin Karel M. Ishmael Holter, MD  cc: Reginia Naas, MD

## 2015-01-08 NOTE — Patient Instructions (Signed)
    Discontinue Rhinocort and Patanase.  Continues Xyzal 5 mg daily.  Dymista 1 spray twice daily after saline nasal wash.  Use saline nasal wash each morning in the shower.  Mucinex one tablet once daily.  Information on allergy injections.  Obtain sinus CT scan--limited coronal views.

## 2015-01-09 ENCOUNTER — Encounter: Payer: Self-pay | Admitting: Allergy and Immunology

## 2015-01-14 ENCOUNTER — Ambulatory Visit (INDEPENDENT_AMBULATORY_CARE_PROVIDER_SITE_OTHER): Payer: BLUE CROSS/BLUE SHIELD | Admitting: Vascular Surgery

## 2015-01-14 ENCOUNTER — Encounter: Payer: Self-pay | Admitting: Vascular Surgery

## 2015-01-14 ENCOUNTER — Encounter (HOSPITAL_COMMUNITY): Payer: BLUE CROSS/BLUE SHIELD

## 2015-01-14 VITALS — BP 133/61 | HR 83 | Temp 98.2°F | Resp 18 | Ht 65.0 in | Wt 186.6 lb

## 2015-01-14 DIAGNOSIS — I8002 Phlebitis and thrombophlebitis of superficial vessels of left lower extremity: Secondary | ICD-10-CM | POA: Diagnosis not present

## 2015-01-14 NOTE — Progress Notes (Signed)
[facility] [place]  Patient name: Katie Nunez MRN: [id number] DOB: 27-May-1955 Sex: female  REASON FOR VISIT:  Left leg pain  HPI: Katie Nunez is a 60 y.o. female  He presents for evaluation of left medial distal thigh pain and tenderness. This started approximately 1-1/2 month of months ago. She was last seen in the office on 07/23/2014 for his left leg superficial thrombophlebitis.  At that time she had extensive thrombosis of the left great saphenous vein from the mid thigh to mid calf with thrombosis of the side branches in that region. She was assured that this was a self-limiting condition.  She reports that the pain in her left leg today is similar to what she experienced back in July. Fortunately, the pain is not as severe.   The pain is in intermittent and aching in nature.  She does have some tiredness and aching feeling in her left leg sometimes.   Past Medical History  Diagnosis Date  . Dysplasia of cervix, low grade (CIN 1)   . Scleroderma ([place])   . Raynaud's disease   . MVP (mitral valve prolapse)   . IBS (irritable bowel syndrome)   . RA (rheumatoid arthritis) ([facility])     FOLLOWED BY DR. SHANAHAN  . Seasonal allergies   . Hypertension   . Achalasia   . Vitamin D deficiency   . Fibromyalgia   . Tubular adenoma 01/08/2008    CECUM  . Allergy     Family History  Problem Relation Age of Onset  . Hypertension Mother   . Diabetes Mother   . Heart disease Father   . Hypertension Maternal Aunt   . Diabetes Maternal Grandmother   . Heart disease Paternal Grandfather   . Cerebral palsy Cousin     1ST COUSIN?  . Diabetes Paternal Grandmother     SOCIAL HISTORY: Social History  Substance Use Topics  . Smoking status: Never Smoker   . Smokeless tobacco: Never Used  . Alcohol Use: No    Allergies  Allergen Reactions  . Savella [Milnacipran Hcl]     Current Outpatient Prescriptions  Medication Sig Dispense Refill  .  Acetaminophen (TYLENOL PO) Take 200 mg by mouth daily as needed.     Marland Kitchen amLODipine (NORVASC) 5 MG tablet Take 5 mg by mouth daily.    . B-12, METHYLCOBALAMIN, SL Place under the tongue.    . budesonide (RHINOCORT ALLERGY) 32 MCG/ACT nasal spray Place 2 sprays into both nostrils daily.    . calcium carbonate (OS-CAL) 600 MG TABS Take 600 mg by mouth 2 (two) times daily with a meal.      . cholecalciferol (VITAMIN D) 1000 UNITS tablet Take 2,000 Units by mouth 1 day or 1 dose.     . clonazePAM (KLONOPIN) 0.5 MG tablet Take 0.5 mg by mouth 2 (two) times daily as needed.      . Cyanocobalamin (VITAMIN B-12 IJ) Inject 1 each as directed every 30 (thirty) days.    . cyclobenzaprine (FLEXERIL) 10 MG tablet Take 10 mg by mouth 3 (three) times daily as needed for muscle spasms.    Marland Kitchen esomeprazole (NEXIUM) 40 MG capsule Take 40 mg by mouth daily at 12 noon.    . fluticasone (FLONASE) 50 MCG/ACT nasal spray Place 1 spray into the nose daily. Reported on 01/08/2015    . GuaiFENesin (MUCINEX PO) Take 400 mg by mouth.    . IBUPROFEN PO Take 200 mg by mouth. Takes  200 mg to 400 mg daily    . levocetirizine (XYZAL) 5 MG tablet Take 1 tablet (5 mg total) by mouth daily. 90 tablet 2  . metroNIDAZOLE (METROGEL) 0.75 % gel Apply 1 application topically daily as needed.    . Naproxen Sodium (ALEVE PO) Take by mouth.    . olmesartan-hydrochlorothiazide (BENICAR HCT) 40-12.5 MG per tablet Take 1 tablet by mouth daily.    . Olopatadine HCl 0.6 % SOLN Use 1-2 sprays in each nostril once daily in the evening for congestion. 3 Bottle 2  . Probiotic Product (PROBIOTIC DAILY PO) Take by mouth.     No current facility-administered medications for this visit.    REVIEW OF SYSTEMS:  [X]  denotes positive finding, [ ]  denotes negative finding Cardiac  Comments:  Chest pain or chest pressure:    Shortness of breath upon exertion:    Short of breath when lying flat:    Irregular heart rhythm:        Vascular    Pain in  calf, thigh, or hip brought on by ambulation:    Pain in feet at night that wakes you up from your sleep:     Blood clot in your veins:    Leg swelling:  x       Pulmonary    Oxygen at home:    Productive cough:     Wheezing:         Neurologic    Sudden weakness in arms or legs:     Sudden numbness in arms or legs:     Sudden onset of difficulty speaking or slurred speech:    Temporary loss of vision in one eye:     Problems with dizziness:         Gastrointestinal    Blood in stool:     Vomited blood:         Genitourinary    Burning when urinating:     Blood in urine:        Psychiatric    Major depression:         Hematologic    Bleeding problems:    Problems with blood clotting too easily:        Skin    Rashes or ulcers:        Constitutional    Fever or chills:      PHYSICAL EXAM: Filed Vitals:   01/14/15 1546  BP: 133/61  Pulse: 83  Temp: 98.2 F (36.8 C)  TempSrc: Oral  Resp: 18  Height: 5\' 5"  (1.651 m)  Weight: 186 lb 9.6 oz (84.641 kg)    GENERAL: The patient is a well-nourished female, in no acute distress. The vital signs are documented above. CARDIAC: There is a regular rate and rhythm.  VASCULAR:  Easily palpable pedal pulses bilaterally.  Dark brown staining at left medial distal thigh.  No palpable cords. A few varicosities of left lower leg.  PULMONARY:  Nonlabored breathing MUSCULOSKELETAL: There are no major deformities or cyanosis. NEUROLOGIC: No focal weakness or paresthesias are detected. SKIN: There are no ulcers or rashes noted. PSYCHIATRIC: The patient has a normal affect.  DATA:  Lower extremity venous reflux evaluation 12/26/2014  Revealing superficial thrombus in the left calf. There is no evidence of DVT. She does have evidence of deep vein reflux in the common femoral, femoral, and popliteal veins. She also has great saphenous reflux. The great saphenous at the proximal calf is 0.38 cm and 0.58 cm at the  saphenofemoral  junction.  MEDICAL ISSUES: Chronic venous insufficiency left lower extremity with superficial thrombophlebitis  The patient has had a recurrent episode of superficial thrombophlebitis of her left lower extremity.  Fortunately, this episode is not as severe as her previous episode back in July 2016. Explained that no intervention was necessary.  Discussed laser ablation of the left great saphenous vein to prevent further episodes.  The patient has opted for conservative treatment with observation and ibuprofen.  Advised use of compression stockings and elevation for treatment of swelling.  She will follow-up on an as needed basis.  Virgina Jock, PA-C [facility]    I have examined the patient, reviewed and agree with above. I reimaged her veins with SonoSite. She does have a large anterior accessory branch giving rise to the varicosities in her thigh and also some dilatation of her great saphenous vein also getting off varicosities. I explained that this is a cause for varicosities in that she has had episodes of recurrent thrombosis in these varicosities in her thigh and calf. This puts her at very little risk for DVT. She is comfortable with conservative treatment alone feel this is appropriate. Did explain we may be a little reduced her risk for recurrent from phlebitis if this becomes a concern with the laser ablation. She is a reassured this discussion will see [place] again on as-needed basis  Curt Jews, MD 01/14/2015 4:38 PM

## 2015-07-31 ENCOUNTER — Other Ambulatory Visit: Payer: Self-pay | Admitting: Family Medicine

## 2015-07-31 ENCOUNTER — Ambulatory Visit
Admission: RE | Admit: 2015-07-31 | Discharge: 2015-07-31 | Disposition: A | Discharge status: Home / Self Care | Payer: BLUE CROSS/BLUE SHIELD | Source: Ambulatory Visit | Attending: Family Medicine | Admitting: Family Medicine

## 2015-07-31 DIAGNOSIS — M79672 Pain in left foot: Secondary | ICD-10-CM

## 2015-08-12 ENCOUNTER — Encounter: Payer: Self-pay | Admitting: Sports Medicine

## 2015-08-12 ENCOUNTER — Ambulatory Visit (INDEPENDENT_AMBULATORY_CARE_PROVIDER_SITE_OTHER): Payer: BLUE CROSS/BLUE SHIELD | Admitting: Sports Medicine

## 2015-08-12 DIAGNOSIS — M2042 Other hammer toe(s) (acquired), left foot: Secondary | ICD-10-CM | POA: Diagnosis not present

## 2015-08-12 DIAGNOSIS — R252 Cramp and spasm: Secondary | ICD-10-CM | POA: Diagnosis not present

## 2015-08-12 DIAGNOSIS — M2012 Hallux valgus (acquired), left foot: Secondary | ICD-10-CM | POA: Diagnosis not present

## 2015-08-12 NOTE — Progress Notes (Signed)
Subjective: Katie Nunez is a 60 y.o. female patient who presents to office for evaluation of left foot pain. Patient states that pain is better no more cramps however occasional stiffness is still present in the morning. Saw PCP who recommended motrin and change in shoes which have helped. Patient denies any other pedal complaints. Denies injury/trip/fall/sprain/any causative factors. Admits to history of RA, scleroderma, and fibromyalgia as well.   Patient Active Problem List   Diagnosis Date Noted  . Fibroid uterus 01/03/2012  . H/O vitamin D deficiency 01/03/2012  . Post-menopausal 01/03/2012  . Scleroderma ([place]) 11/16/2010  . Rheumatoid arthritis(714.0) 11/16/2010  . Raynaud's disease /phenomenon 11/16/2010  . Hypertension 11/16/2010  . Symptomatic menopausal or female climacteric states 11/16/2010    Current Outpatient Prescriptions on File Prior to Visit  Medication Sig Dispense Refill  . Acetaminophen (TYLENOL PO) Take 200 mg by mouth daily as needed.     Marland Kitchen amLODipine (NORVASC) 5 MG tablet Take 5 mg by mouth daily.    . B-12, METHYLCOBALAMIN, SL Place under the tongue.    . budesonide (RHINOCORT ALLERGY) 32 MCG/ACT nasal spray Place 2 sprays into both nostrils daily.    . calcium carbonate (OS-CAL) 600 MG TABS Take 600 mg by mouth 2 (two) times daily with a meal.      . cholecalciferol (VITAMIN D) 1000 UNITS tablet Take 2,000 Units by mouth 1 day or 1 dose.     . clonazePAM (KLONOPIN) 0.5 MG tablet Take 0.5 mg by mouth 2 (two) times daily as needed.      . Cyanocobalamin (VITAMIN B-12 IJ) Inject 1 each as directed every 30 (thirty) days.    . cyclobenzaprine (FLEXERIL) 10 MG tablet Take 10 mg by mouth 3 (three) times daily as needed for muscle spasms.    Marland Kitchen esomeprazole (NEXIUM) 40 MG capsule Take 40 mg by mouth daily at 12 noon.    . fluticasone (FLONASE) 50 MCG/ACT nasal spray Place 1 spray into the nose daily. Reported on 01/08/2015    . GuaiFENesin (MUCINEX PO) Take 400 mg  by mouth.    . IBUPROFEN PO Take 200 mg by mouth. Takes 200 mg to 400 mg daily    . levocetirizine (XYZAL) 5 MG tablet Take 1 tablet (5 mg total) by mouth daily. 90 tablet 2  . metroNIDAZOLE (METROGEL) 0.75 % gel Apply 1 application topically daily as needed.    . Naproxen Sodium (ALEVE PO) Take by mouth.    . olmesartan-hydrochlorothiazide (BENICAR HCT) 40-12.5 MG per tablet Take 1 tablet by mouth daily.    . Olopatadine HCl 0.6 % SOLN Use 1-2 sprays in each nostril once daily in the evening for congestion. 3 Bottle 2  . Probiotic Product (PROBIOTIC DAILY PO) Take by mouth.     No current facility-administered medications on file prior to visit.     Allergies  Allergen Reactions  . Savella [Milnacipran Hcl]     Objective:  General: Alert and oriented x3 in no acute distress  Dermatology: No open lesions bilateral lower extremities, no webspace macerations, no ecchymosis bilateral, Diffuse plantar callus and dry skin, all nails x 10 are well manicured.  Vascular: Dorsalis Pedis and Posterior Tibial pedal pulses palpable, Capillary Fill Time 3 seconds,(+) pedal hair growth bilateral, no edema bilateral lower extremities, Temperature gradient within normal limits.  Neurology: Johney Maine sensation intact via light touch bilateral. (- )Tinels sign bilateral.   Musculoskeletal: No reproducible tenderness bilateral,No pain with calf compression bilateral. There is decreased ankle  rom with knee extending  vs flexed resembling gastroc equnius bilateral, Subtalar joint range of motion is within normal limits, there is no 1st ray hypermobility noted bilateral, decreased 1st MPJ rom Right>Left with functional limitus noted on weightbearing exam and significant bunion on left with lesser hammertoe left>right. Strength within normal limits in all groups bilateral.   Assessment and Plan: Problem List Items Addressed This Visit    None    Visit Diagnoses    Foot cramps    -  Primary   Hammer toe of  left foot       HAV (hallux abducto valgus), left           -Complete examination performed -Previous Xrays from 07-31-15 reviewed -Discussed treatement options cramps and stiffness probable arthritis secondary to foot type -Dispensed metatarsal padding; Advised patient to consider orthotics is does well with pads -Advised patient if no relief will consider steroids and anti-inflammatories -Recommend good supportive shoes for foot type, soaking, and icy hot with lidocaine as needed -Patient to return to office as needed or sooner if condition worsens.  Landis Martins, DPM

## 2015-08-26 ENCOUNTER — Encounter: Payer: Self-pay | Admitting: Sports Medicine

## 2015-08-26 ENCOUNTER — Ambulatory Visit (INDEPENDENT_AMBULATORY_CARE_PROVIDER_SITE_OTHER): Payer: BLUE CROSS/BLUE SHIELD | Admitting: Sports Medicine

## 2015-08-26 ENCOUNTER — Ambulatory Visit (INDEPENDENT_AMBULATORY_CARE_PROVIDER_SITE_OTHER): Payer: BLUE CROSS/BLUE SHIELD

## 2015-08-26 DIAGNOSIS — M1000 Idiopathic gout, unspecified site: Secondary | ICD-10-CM | POA: Diagnosis not present

## 2015-08-26 DIAGNOSIS — M79674 Pain in right toe(s): Secondary | ICD-10-CM

## 2015-08-26 DIAGNOSIS — R609 Edema, unspecified: Secondary | ICD-10-CM | POA: Diagnosis not present

## 2015-08-26 MED ORDER — METHYLPREDNISOLONE 4 MG PO TBPK: ORAL_TABLET | ORAL | 0 refills | Status: DC

## 2015-08-26 NOTE — Progress Notes (Signed)
Subjective: Katie Nunez is a 60 y.o. female patient who presents to office for evaluation of Right foot pain. Patient complains of progressive pain especially over the last few days at right 4th toe. Admits to warmth, redness, and swelling to the area; went to PCP who started on Mobic 7.5mg  with some relief however still red, swollen, and tender. Denies previous gouty attack/family history/kidney disease. Admits diet increased in Seafood. Patient denies any other pedal complaints.   History of RA, Scleroderma, and Fibromyalgia.   Patient Active Problem List   Diagnosis Date Noted  . Fibroid uterus 01/03/2012  . H/O vitamin D deficiency 01/03/2012  . Post-menopausal 01/03/2012  . Scleroderma ([place]) 11/16/2010  . Rheumatoid arthritis(714.0) 11/16/2010  . Raynaud's disease /phenomenon 11/16/2010  . Hypertension 11/16/2010  . Symptomatic menopausal or female climacteric states 11/16/2010    Current Outpatient Prescriptions on File Prior to Visit  Medication Sig Dispense Refill  . Acetaminophen (TYLENOL PO) Take 200 mg by mouth daily as needed.     Marland Kitchen amLODipine (NORVASC) 5 MG tablet Take 5 mg by mouth daily.    . B-12, METHYLCOBALAMIN, SL Place under the tongue.    . budesonide (RHINOCORT ALLERGY) 32 MCG/ACT nasal spray Place 2 sprays into both nostrils daily.    . calcium carbonate (OS-CAL) 600 MG TABS Take 600 mg by mouth 2 (two) times daily with a meal.      . cholecalciferol (VITAMIN D) 1000 UNITS tablet Take 2,000 Units by mouth 1 day or 1 dose.     . clonazePAM (KLONOPIN) 0.5 MG tablet Take 0.5 mg by mouth 2 (two) times daily as needed.      . Cyanocobalamin (VITAMIN B-12 IJ) Inject 1 each as directed every 30 (thirty) days.    . cyclobenzaprine (FLEXERIL) 10 MG tablet Take 10 mg by mouth 3 (three) times daily as needed for muscle spasms.    Marland Kitchen esomeprazole (NEXIUM) 40 MG capsule Take 40 mg by mouth daily at 12 noon.    . fluticasone (FLONASE) 50 MCG/ACT nasal spray Place 1 spray  into the nose daily. Reported on 01/08/2015    . GuaiFENesin (MUCINEX PO) Take 400 mg by mouth.    . IBUPROFEN PO Take 200 mg by mouth. Takes 200 mg to 400 mg daily    . levocetirizine (XYZAL) 5 MG tablet Take 1 tablet (5 mg total) by mouth daily. 90 tablet 2  . metroNIDAZOLE (METROGEL) 0.75 % gel Apply 1 application topically daily as needed.    . Naproxen Sodium (ALEVE PO) Take by mouth.    . olmesartan-hydrochlorothiazide (BENICAR HCT) 40-12.5 MG per tablet Take 1 tablet by mouth daily.    . Olopatadine HCl 0.6 % SOLN Use 1-2 sprays in each nostril once daily in the evening for congestion. 3 Bottle 2  . Probiotic Product (PROBIOTIC DAILY PO) Take by mouth.     No current facility-administered medications on file prior to visit.     Allergies  Allergen Reactions  . Savella [Milnacipran Hcl]     Objective:  General: Alert and oriented x3 in no acute distress  Dermatology: Focal Swelling, warmth, redness present on the Right 4th toe, No open lesions bilateral lower extremities, no webspace macerations, no ecchymosis bilateral, all nails x 10 are well manicured. Callus skin bilateral.   Vascular: Dorsalis Pedis and Posterior Tibial pedal pulses 2/4, Capillary Fill Time 3 seconds,(+) pedal hair growth bilateral,Temperature gradient increased over the Right 4th toe.  Neurology: Gross sensation intact via light touch  bilateral, (- )Tinels sign bilateral.   Musculoskeletal: There is tenderness with palpation at 4th toe on Right foot,No pain with calf compression bilateral. Limited range of motion at right 4th toe where there is pain, Strength within normal limits in all groups bilateral. L>R bunion with limited motion and hammertoe.  Xrays  Right Foot    Impression: Normal osseous mineralization, Ankle hardware intact, No fracture/dislocation or bony erosions at 4th toe at area of concern.        Assessment and Plan: Problem List Items Addressed This Visit    None    Visit Diagnoses     Toe pain, right    -  Primary   Relevant Medications   methylPREDNISolone (MEDROL DOSEPAK) 4 MG TBPK tablet   Other Relevant Orders   DG Foot 2 Views Right   Swelling       Relevant Medications   methylPREDNISolone (MEDROL DOSEPAK) 4 MG TBPK tablet   Idiopathic gout, unspecified chronicity, unspecified site       Relevant Medications   methylPREDNISolone (MEDROL DOSEPAK) 4 MG TBPK tablet       -Complete examination performed -Xrays reviewed -Discussed treatement options for possible clinical gouty arthritis and gout education provided - Rx Medrol to take as instructed -Recommend follow up with rheumatologist for arthritic/gout panel -Advised patient to call if symptoms are not improved within 1 week -Patient to return as needed or sooner if condition worsens.  Landis Martins, DPM

## 2015-08-26 NOTE — Patient Instructions (Signed)

## 2015-10-08 DIAGNOSIS — G8929 Other chronic pain: Secondary | ICD-10-CM | POA: Insufficient documentation

## 2015-10-08 DIAGNOSIS — R51 Headache: Secondary | ICD-10-CM

## 2015-10-10 ENCOUNTER — Telehealth: Payer: Self-pay | Admitting: *Deleted

## 2015-10-10 ENCOUNTER — Encounter: Payer: Self-pay | Admitting: Podiatry

## 2015-10-10 ENCOUNTER — Ambulatory Visit (INDEPENDENT_AMBULATORY_CARE_PROVIDER_SITE_OTHER): Payer: BLUE CROSS/BLUE SHIELD | Admitting: Podiatry

## 2015-10-10 ENCOUNTER — Ambulatory Visit (INDEPENDENT_AMBULATORY_CARE_PROVIDER_SITE_OTHER): Payer: BLUE CROSS/BLUE SHIELD

## 2015-10-10 DIAGNOSIS — M1000 Idiopathic gout, unspecified site: Secondary | ICD-10-CM

## 2015-10-10 DIAGNOSIS — M79672 Pain in left foot: Secondary | ICD-10-CM | POA: Diagnosis not present

## 2015-10-10 DIAGNOSIS — M779 Enthesopathy, unspecified: Secondary | ICD-10-CM

## 2015-10-10 MED ORDER — MELOXICAM 15 MG PO TABS: 15.0000 mg | ORAL_TABLET | Freq: Every day | ORAL | 2 refills | Status: DC

## 2015-10-10 MED ORDER — TRIAMCINOLONE ACETONIDE 10 MG/ML IJ SUSP
10.0000 mg | Freq: Once | INTRAMUSCULAR | Status: AC
  Administered 2015-10-10: 10 mg

## 2015-10-10 NOTE — Telephone Encounter (Signed)
[facility] states C-Reactive Protein was put in as a fluid order instead of blood.  I told Katie Nunez, our doctors ordered blood for this test. Katie Nunez states he will cancel the fluid test and order the blood test.

## 2015-10-10 NOTE — Progress Notes (Signed)
Subjective:     Patient ID: Katie Nunez, female   DOB: 1955/06/19, 60 y.o.   MRN: [id number]  HPI patient presents with inflammation within the left foot around the left arch and dorsal foot and states it feels like gout to her. States that the right one is doing better   Review of Systems     Objective:   Physical Exam Neurovascular status intact muscle strength adequate with inflammation the dorsum of the left foot around the first metatarsocuneiform and into the left arch with inflammation and fluid buildup noted    Assessment:     Strong possibility for acute gout as this is just started with inflammatory changes    Plan:     Reviewed gout with patient and at this time I injected the dorsal tendon complex 3 mg Kenalog 5 mg Xylocaine and then did an arch injection 3 [place] Kenalog 5 mill grams Xylocaine and instructed on physical therapy and sent for blood work to rule out gout or other arthritic condition that she may have. Placed on mobile big 15 mg daily  X-rays indicated structural bunion deformity with no other pathology and mild lysis around the metatarsal head itself

## 2015-10-11 LAB — SEDIMENTATION RATE: SED RATE: 45 mm/h — AB (ref 0–30)

## 2015-10-11 LAB — URIC ACID: URIC ACID, SERUM: 10.3 mg/dL — AB (ref 2.5–7.0)

## 2015-10-13 ENCOUNTER — Ambulatory Visit: Payer: BLUE CROSS/BLUE SHIELD | Admitting: Podiatry

## 2015-10-13 LAB — ANTI-NUCLEAR AB-TITER (ANA TITER): ANA Titer 1: 1:1280 {titer} — ABNORMAL HIGH

## 2015-10-13 LAB — ANA: ANA: POSITIVE — AB

## 2015-10-13 LAB — RHEUMATOID FACTOR: Rhuematoid fact SerPl-aCnc: 31 IU/mL — ABNORMAL HIGH (ref <14)

## 2015-10-13 LAB — C-REACTIVE PROTEIN: CRP: 3 mg/L (ref <8.0)

## 2015-10-16 ENCOUNTER — Telehealth: Payer: Self-pay | Admitting: *Deleted

## 2015-10-16 NOTE — Telephone Encounter (Signed)
Dr. Paulla Dolly reviewed 10/10/2015 arthritic panel as gout, and stated pt should make an appt to see him or her PCP.  Pt states she has an appt with Dr. Paulla Dolly tomorrow.

## 2015-10-17 ENCOUNTER — Encounter: Payer: Self-pay | Admitting: Podiatry

## 2015-10-17 ENCOUNTER — Ambulatory Visit (INDEPENDENT_AMBULATORY_CARE_PROVIDER_SITE_OTHER): Payer: BLUE CROSS/BLUE SHIELD | Admitting: Podiatry

## 2015-10-17 DIAGNOSIS — M2012 Hallux valgus (acquired), left foot: Secondary | ICD-10-CM | POA: Diagnosis not present

## 2015-10-17 DIAGNOSIS — M1000 Idiopathic gout, unspecified site: Secondary | ICD-10-CM

## 2015-10-17 MED ORDER — ALLOPURINOL 100 MG PO TABS: 100.0000 mg | ORAL_TABLET | Freq: Every day | ORAL | 6 refills | Status: DC

## 2015-10-19 NOTE — Progress Notes (Signed)
Subjective:     Patient ID: Katie Nunez, female   DOB: 30-Aug-1955, 60 y.o.   MRN: [id number]  HPI patient points to left foot stating that it does seem to be quite a bit better from previous but I did have concerns about my blood work   Review of Systems     Objective:   Physical Exam Neurovascular status intact with patient continuing to have significant gout-like symptomatology with diminishment of discomfort from previous treatment    Assessment:     Gout present in the body at this time with induced acute inflammatory condition    Plan:     Reviewed blood work which she is can do show to family physician with considerations long-term for treatment to keep the uric acid level down. I did start her on allopurinol today and she will follow-up with her family physician and hopefully her uric acid level will come down over the next several months and he will then decide on appropriate long-term treatment of condition to develop systemic condition or inflammatory condition in her foot again

## 2015-10-21 ENCOUNTER — Telehealth: Payer: Self-pay | Admitting: *Deleted

## 2015-10-21 DIAGNOSIS — M109 Gout, unspecified: Secondary | ICD-10-CM

## 2015-10-21 NOTE — Telephone Encounter (Signed)
Pt states Dr. Paulla Dolly diagnosed her with gout and she would like to be referred to the [facility] - instructor Jimmye Norman [phone].

## 2015-10-21 NOTE — Telephone Encounter (Signed)
That's fine

## 2015-10-22 DIAGNOSIS — M1A9XX0 Chronic gout, unspecified, without tophus (tophi): Secondary | ICD-10-CM | POA: Insufficient documentation

## 2015-10-22 DIAGNOSIS — G5603 Carpal tunnel syndrome, bilateral upper limbs: Secondary | ICD-10-CM | POA: Insufficient documentation

## 2015-10-22 NOTE — Telephone Encounter (Signed)
10/21/2015-Pt states she would like to be referred to Roxan Hockey, nutritionist with [facility] [phone], for gout diet instruction. Pt states she checked with her insurance and does not need Prior Authorization. 10/22/2015-DrPaulla Dolly ordered referral to nutritionist as pt requested. I informed pt of the referral. Faxed referral, pt clinicals, and demographics to [facility] [phone].

## 2015-11-06 ENCOUNTER — Encounter: Payer: Self-pay | Admitting: Dietician

## 2015-11-06 ENCOUNTER — Encounter: Payer: BLUE CROSS/BLUE SHIELD | Attending: Family Medicine | Admitting: Dietician

## 2015-11-06 DIAGNOSIS — M109 Gout, unspecified: Secondary | ICD-10-CM | POA: Diagnosis not present

## 2015-11-06 DIAGNOSIS — R7303 Prediabetes: Secondary | ICD-10-CM

## 2015-11-06 DIAGNOSIS — Z713 Dietary counseling and surveillance: Secondary | ICD-10-CM | POA: Insufficient documentation

## 2015-11-06 NOTE — Progress Notes (Signed)
  Medical Nutrition Therapy:  Appt start time: 1030 end time: 1120  Follow up:  Primary concerns today: Concha returns for a nutrition follow up. She was seen in our office in 2015 for prediabetes. Recent HgbA1c unavailable. Tymesha still struggles with chronic pain. Doing physical therapy 2x a week and still does some stretching. She has recently had a gout flare up. Has been reading about low purine diet and paying attention to her dietary triggers. Trying to drink more water and has cut back on sodas.    Preferred Learning Style:  No preference indicated   Learning Readiness:   Ready  MEDICATIONS: see list   DIETARY INTAKE:  Breakfast: mini bagel Lunch: chicken wings and fries Dinner: pork chop  Beverages: 1 cup coffee with flavored creamer, 68 oz water per day, sparkling water  Usual physical activity: Physical therapy  Estimated energy needs: 1500-1600 calories 170-180 g carbohydrates  Progress Towards Goal(s):  In progress.   Nutritional Diagnosis:  Nunez-2.2 Altered nutrition-related laboratory As related to history of inconsistent eating pattern, large portion sizes, and physical inactivity.  As evidenced by HgbA1c 5.7%.    Intervention:  Nutrition counseling provided. Discussed low purine diet and reviewed carbohydrate metabolism per patient request. Goals: -Reduce sodas -Keep drinking plenty of water -Try to maintain a regular meal pattern -Include a low purine protein food with each meal: eggs, nuts, yogurt, milk -Increase physical activity: mall walking (as tolerated!)  -Aim for 2x a week  -See if you can find a walking buddy  Handouts provided: -Low purine nutrition therapy -Meal planning card  Teaching Method Utilized:  Visual Auditory Hands on  Barriers to learning/adherence to lifestyle change: stress and chronic pain  Demonstrated degree of understanding via:  Teach Back   Monitoring/Evaluation:  Dietary intake, exercise, and body weight in 3  week(s).

## 2015-11-06 NOTE — Patient Instructions (Addendum)
-  Reduce sodas -Keep drinking plenty of water -Try to maintain a regular meal pattern -Include a low purine protein food with each meal: eggs, nuts, yogurt, milk -Increase physical activity: mall walking (as tolerated!)  -Aim for 2x a week  -See if you can find a walking buddy

## 2015-11-21 ENCOUNTER — Ambulatory Visit (INDEPENDENT_AMBULATORY_CARE_PROVIDER_SITE_OTHER): Payer: BLUE CROSS/BLUE SHIELD

## 2015-11-21 ENCOUNTER — Ambulatory Visit (INDEPENDENT_AMBULATORY_CARE_PROVIDER_SITE_OTHER): Payer: BLUE CROSS/BLUE SHIELD | Admitting: Podiatry

## 2015-11-21 ENCOUNTER — Encounter: Payer: Self-pay | Admitting: Podiatry

## 2015-11-21 DIAGNOSIS — M779 Enthesopathy, unspecified: Secondary | ICD-10-CM

## 2015-11-21 DIAGNOSIS — M79672 Pain in left foot: Secondary | ICD-10-CM

## 2015-11-21 DIAGNOSIS — R52 Pain, unspecified: Secondary | ICD-10-CM | POA: Diagnosis not present

## 2015-11-21 DIAGNOSIS — M1000 Idiopathic gout, unspecified site: Secondary | ICD-10-CM

## 2015-11-21 MED ORDER — TRIAMCINOLONE ACETONIDE 10 MG/ML IJ SUSP
10.0000 mg | Freq: Once | INTRAMUSCULAR | Status: AC
  Administered 2015-11-21: 10 mg

## 2015-11-21 MED ORDER — METHYLPREDNISOLONE 4 MG PO TBPK: ORAL_TABLET | ORAL | 0 refills | Status: DC

## 2015-11-22 NOTE — Progress Notes (Signed)
Subjective:     Patient ID: Katie Nunez, female   DOB: September 17, 1955, 60 y.o.   MRN: [id number]  HPI patient states I developed a flareup again on my left foot and the right one is doing pretty well post flareup. I am taking allopurinol but this has started recently   Review of Systems     Objective:   Physical Exam Neurovascular status is intact with no other changes at the current time with patient noted to have inflammatory changes in the lateral side of the left foot and into the arch region left. Does not remember specific injury but does have structural abnormalities and again history of gout    Assessment:     Reviewed condition discussing this problem and today I did inject the sheath left 3 mg Kenalog 5 mg Xylocaine advised on ice therapy and dispensed a fascial brace to lift the arch up and take strain off the arch.    Plan:     Reviewed condition discussing gout versus tendinitis or other inflammatory condition currently  X-ray report was negative for signs of fracture left foot or other bone pathology

## 2015-11-28 ENCOUNTER — Ambulatory Visit: Payer: BLUE CROSS/BLUE SHIELD | Admitting: Dietician

## 2015-12-12 ENCOUNTER — Ambulatory Visit: Payer: BLUE CROSS/BLUE SHIELD | Admitting: Podiatry

## 2015-12-31 ENCOUNTER — Other Ambulatory Visit: Payer: Self-pay | Admitting: *Deleted

## 2015-12-31 ENCOUNTER — Telehealth: Payer: Self-pay | Admitting: Vascular Surgery

## 2015-12-31 ENCOUNTER — Telehealth: Payer: Self-pay | Admitting: *Deleted

## 2015-12-31 DIAGNOSIS — I83893 Varicose veins of bilateral lower extremities with other complications: Secondary | ICD-10-CM

## 2015-12-31 NOTE — Telephone Encounter (Signed)
Per [employer] instruction, I called patient and left a voice mail regarding the first available appt to see Dr.Early. 01/27/16 at 3:30pm for venous reflux study and to see TFE at 4pm. I asked the patient to call our office back to either confirm this appt time/date or to rsc to a better date for her. AWT

## 2015-12-31 NOTE — Telephone Encounter (Signed)
Katie Nunez returning my voice message to her.  Katie Nunez is an established patient of Curt Jews MD with history of recurrent superficial thrombophlebitis left leg.  Katie Nunez states she is experiencing "knotty" varicosities behind left knee and left posterior thigh and she feels "dull ache" over her varicosities.  Katie Nunez states that she is experiencing right leg swelling and "tiredness". Advised her to elevate her legs when sitting and to wear her thigh high compression hose.  [facility] schedulers will call her with appointment to see Dr. Donnetta Hutching and bilateral venous reflux study.  Katie Nunez verbalized understanding.

## 2015-12-31 NOTE — Telephone Encounter (Signed)
-----   Message from Rica Records, RN sent at 12/31/2015 11:38 AM EST ----- Regarding: SCHEDULING This patient is an established patient of [facility].  She was last seen 01-14-2015 by [facility].  Her last venous reflux was on 12-26-2014.  She has a history of recurrent superficial thrombophlebitis and she is having she is having right leg swelling and left leg pain and "knotted" varicosities.  Please schedule her for bilateral venous reflux study and a VV FU to see Dr. Donnetta Hutching.  You can reach her at [phone].  Thanks!

## 2015-12-31 NOTE — Telephone Encounter (Signed)
Returning Katie Nunez's earlier phone message regarding recurrent left leg symptoms. She is an established patient of Dr. Sherren Mocha Early with history of superficial thrombophlebitis in left leg. Left detailed voice message for her to call me back at [phone].

## 2016-01-06 ENCOUNTER — Other Ambulatory Visit (HOSPITAL_COMMUNITY): Payer: Self-pay | Admitting: Respiratory Therapy

## 2016-01-06 DIAGNOSIS — M349 Systemic sclerosis, unspecified: Secondary | ICD-10-CM

## 2016-01-15 ENCOUNTER — Telehealth: Payer: Self-pay | Admitting: *Deleted

## 2016-01-15 NOTE — Telephone Encounter (Signed)
Pt states she sees Dr. Paulla Dolly and that her clinical notes from her visits have not been sent to Dr. Jossie Ng office [phone]. Please contact Dr. Thompson Caul office and pt.

## 2016-01-16 NOTE — Telephone Encounter (Signed)
Called patient to see what notes Dr. Tamala Julian was wanting. She stated she was under the impression every time she came. I asked if she wanted them all faxed and she stated no, just from 12 January 2015 - Present is all that needs to be sent to Dr. Thompson Caul office. I told the patient I would go ahead and take care of this for her.

## 2016-01-22 ENCOUNTER — Encounter: Payer: Self-pay | Admitting: Vascular Surgery

## 2016-01-27 ENCOUNTER — Ambulatory Visit (HOSPITAL_COMMUNITY)
Admission: RE | Admit: 2016-01-27 | Discharge: 2016-01-27 | Disposition: A | Discharge status: Home / Self Care | Payer: BLUE CROSS/BLUE SHIELD | Source: Ambulatory Visit | Attending: Vascular Surgery | Admitting: Vascular Surgery

## 2016-01-27 ENCOUNTER — Ambulatory Visit (INDEPENDENT_AMBULATORY_CARE_PROVIDER_SITE_OTHER): Payer: BLUE CROSS/BLUE SHIELD | Admitting: Vascular Surgery

## 2016-01-27 ENCOUNTER — Encounter: Payer: Self-pay | Admitting: Vascular Surgery

## 2016-01-27 VITALS — BP 152/98 | HR 77 | Temp 98.3°F | Resp 18 | Ht 65.0 in | Wt 187.0 lb

## 2016-01-27 DIAGNOSIS — I872 Venous insufficiency (chronic) (peripheral): Secondary | ICD-10-CM | POA: Insufficient documentation

## 2016-01-27 DIAGNOSIS — I87303 Chronic venous hypertension (idiopathic) without complications of bilateral lower extremity: Secondary | ICD-10-CM

## 2016-01-27 DIAGNOSIS — Z992 Dependence on renal dialysis: Secondary | ICD-10-CM

## 2016-01-27 DIAGNOSIS — I83893 Varicose veins of bilateral lower extremities with other complications: Secondary | ICD-10-CM | POA: Diagnosis present

## 2016-01-27 DIAGNOSIS — N186 End stage renal disease: Secondary | ICD-10-CM

## 2016-01-27 NOTE — Progress Notes (Signed)
[facility] [place]  Patient name: Katie Nunez MRN: [id number] DOB: 1955/11/10 Sex: female  REASON FOR VISIT: Evaluation of leg discomfort and swelling  HPI: Katie Nunez is a 61 y.o. female well-known to me from prior evaluation. She is been seen with episodes of superficial thrombophlebitis and varicosities in her thighs. She does have a connective tissue disorder with rheumatoid arthritis and also fibromyalgia and scleroderma. She has multiple superficial varicosities throughout her thighs and pretibial areas showing down to her feet bilaterally. In the past she has had thromboses of this. She does not have any recent episodes of this. She reports fullness and tiredness and she was seen today to assure that this is not the sign of more serious complication. She also reports that recently she's been having more difficulty controlling her hypertension.  Past Medical History:  Diagnosis Date  . Achalasia   . Allergy   . Dysplasia of cervix, low grade (CIN 1)   . Fibromyalgia   . Gout   . Hypertension   . IBS (irritable bowel syndrome)   . MVP (mitral valve prolapse)   . RA (rheumatoid arthritis) (HCC)    FOLLOWED BY DR. SHANAHAN  . Raynaud's disease   . Scleroderma ([place])   . Seasonal allergies   . Tubular adenoma 01/08/2008   CECUM  . Vitamin D deficiency     Family History  Problem Relation Age of Onset  . Hypertension Mother   . Diabetes Mother   . Heart disease Father   . Hypertension Maternal Aunt   . Diabetes Maternal Grandmother   . Heart disease Paternal Grandfather   . Cerebral palsy Cousin     1ST COUSIN?  . Diabetes Paternal Grandmother     SOCIAL HISTORY: Social History  Substance Use Topics  . Smoking status: Never Smoker  . Smokeless tobacco: Never Used  . Alcohol use No    Allergies  Allergen Reactions  . Savella [Milnacipran Hcl]     Current Outpatient Prescriptions    Medication Sig Dispense Refill  . Acetaminophen (TYLENOL PO) Take 200 mg by mouth daily as needed.     Marland Kitchen allopurinol (ZYLOPRIM) 100 MG tablet Take 1 tablet (100 mg total) by mouth daily. 30 tablet 6  . amLODipine (NORVASC) 5 MG tablet Take 5 mg by mouth daily.    . B-12, METHYLCOBALAMIN, SL Place under the tongue.    . budesonide (RHINOCORT ALLERGY) 32 MCG/ACT nasal spray Place 2 sprays into both nostrils daily.    . calcium carbonate (OS-CAL) 600 MG TABS Take 600 mg by mouth 2 (two) times daily with a meal.      . cholecalciferol (VITAMIN D) 1000 UNITS tablet Take 2,000 Units by mouth 1 day or 1 dose.     . clonazePAM (KLONOPIN) 0.5 MG tablet Take 0.5 mg by mouth 2 (two) times daily as needed.      . cyclobenzaprine (FLEXERIL) 10 MG tablet Take 10 mg by mouth 3 (three) times daily as needed for muscle spasms.    Marland Kitchen esomeprazole (NEXIUM) 40 MG capsule Take 40 mg by mouth daily at 12 noon.    . fluticasone (FLONASE) 50 MCG/ACT nasal spray Place 1 spray into the nose daily. Reported on 01/08/2015    . furosemide (LASIX) 20 MG tablet 20 mg every other day.  3  . GuaiFENesin (MUCINEX PO) Take 400 mg by mouth.    . levocetirizine (XYZAL) 5 MG tablet Take 1 tablet (5 mg  total) by mouth daily. 90 tablet 2  . metroNIDAZOLE (METROGEL) 0.75 % gel Apply 1 application topically daily as needed.    . Naproxen Sodium (ALEVE PO) Take by mouth.    . olmesartan (BENICAR) 40 MG tablet 40 mg daily.  2  . Olopatadine HCl 0.6 % SOLN Use 1-2 sprays in each nostril once daily in the evening for congestion. 3 Bottle 2  . Probiotic Product (PROBIOTIC DAILY PO) Take by mouth.    . Cyanocobalamin (VITAMIN B-12 IJ) Inject 1 each as directed every 30 (thirty) days.    . IBUPROFEN PO Take 200 mg by mouth. Takes 200 mg to 400 mg daily    . meloxicam (MOBIC) 15 MG tablet Take 1 tablet (15 mg total) by mouth daily. (Patient not taking: Reported on 01/27/2016) 30 tablet 2  . methylPREDNISolone (MEDROL DOSEPAK) 4 MG TBPK tablet  Take as instructed (Patient not taking: Reported on 01/27/2016) 21 tablet 0  . methylPREDNISolone (MEDROL DOSEPAK) 4 MG TBPK tablet follow package directions (Patient not taking: Reported on 01/27/2016) 21 tablet 0  . olmesartan-hydrochlorothiazide (BENICAR HCT) 40-12.5 MG per tablet Take 1 tablet by mouth daily.     No current facility-administered medications for this visit.     REVIEW OF SYSTEMS:  [X]  denotes positive finding, [ ]  denotes negative finding Cardiac  Comments:  Chest pain or chest pressure:    Shortness of breath upon exertion:    Short of breath when lying flat:    Irregular heart rhythm:        Vascular    Pain in calf, thigh, or hip brought on by ambulation: x   Pain in feet at night that wakes you up from your sleep:     Blood clot in your veins:    Leg swelling:  x         PHYSICAL EXAM: Vitals:   01/27/16 1631 01/27/16 1637 01/27/16 1650  BP: (!) 160/100 (!) 152/98   Pulse: 77    Resp: 18    Temp: 98.3 F (36.8 C)    TempSrc: Oral    SpO2:   90%  Weight: 187 lb (84.8 kg)    Height: 5\' 5"  (1.651 m)      GENERAL: The patient is a well-nourished female, in no acute distress. The vital signs are documented above. CARDIOVASCULAR: Palpable dorsalis pedis pulses bilaterally multiple scattered varicosities over her anterior thighs and pretibial areas bilaterally PULMONARY: There is good air exchange  MUSCULOSKELETAL: There are no major deformities or cyanosis. NEUROLOGIC: No focal weakness or paresthesias are detected. SKIN: There are no ulcers or rashes noted. No changes of chronic venous hypertension PSYCHIATRIC: The patient has a normal affect.  DATA:  Duplex today was compared with the prior duplex in 2016. She does not have any evidence of significant reflux in her saphenous vein. She does have several accessory branches arising from her common femoral vein and these do extend into the varicosities for short segment.  MEDICAL ISSUES: I reassured the  patient this does not put her any increased risk for DVT. I do not see any possibility for treatment to improve her symptoms. She is not concerned regarding the appearance of the varicosities. I explained to her that she is quite prone to have lifelong progression of these varicosities. I did explain the critical importance of elevation to her. She was reassured with this discussion and will see [place] again on an as-needed basis    Rosetta Posner, MD [facility] Tel [phone] Pager [phone]

## 2016-02-12 ENCOUNTER — Ambulatory Visit (INDEPENDENT_AMBULATORY_CARE_PROVIDER_SITE_OTHER): Payer: BLUE CROSS/BLUE SHIELD | Admitting: Podiatry

## 2016-02-12 ENCOUNTER — Encounter: Payer: Self-pay | Admitting: Podiatry

## 2016-02-12 DIAGNOSIS — M109 Gout, unspecified: Secondary | ICD-10-CM

## 2016-02-12 DIAGNOSIS — M779 Enthesopathy, unspecified: Secondary | ICD-10-CM | POA: Diagnosis not present

## 2016-02-12 MED ORDER — TRIAMCINOLONE ACETONIDE 10 MG/ML IJ SUSP
10.0000 mg | Freq: Once | INTRAMUSCULAR | Status: AC
  Administered 2016-02-12: 10 mg

## 2016-02-12 MED ORDER — METHYLPREDNISOLONE 4 MG PO TBPK: ORAL_TABLET | ORAL | 0 refills | Status: DC

## 2016-02-12 NOTE — Progress Notes (Signed)
Subjective:     Patient ID: Katie Nunez, female   DOB: 1955-08-04, 61 y.o.   MRN: [id number]  HPI patient presents stating all of a sudden she started develop a lot of pain in her left ankle and lateral foot and does not remember specific injury and feels like it might be gout   Review of Systems     Objective:   Physical Exam Neurovascular status intact negative Homans sign was noted with patient found to have exquisite discomfort in the sinus tarsi left and along the lateral side of the left foot    Assessment:     Capsulitis with possible sinus tarsitis and gout symptomatology left    Plan:     H&P condition reviewed and discussed gout and foods to be careful with. Injected the capsule of the sinus tarsi 3 Milligan Kenalog 5 mg Xylocaine and along the lateral side and placed on Medrol Dosepak. Reappoint for [place] to recheck again in the next several weeks  X-ray indicates no signs stress fracture with moderate arthritis

## 2016-05-05 ENCOUNTER — Other Ambulatory Visit: Payer: Self-pay | Admitting: Podiatry

## 2016-06-22 ENCOUNTER — Telehealth: Payer: Self-pay | Admitting: *Deleted

## 2016-06-22 NOTE — Telephone Encounter (Signed)
Pt called for refill of Allopurinol. I reviewed pt's clinicals and Dr. Paulla Dolly had wanted pt to make a follow up appt after 02/12/2016 appt. I informed pt of the need for appt and she states understanding, transferred pt to schedulers

## 2016-06-24 ENCOUNTER — Other Ambulatory Visit: Payer: Self-pay | Admitting: Nephrology

## 2016-06-24 DIAGNOSIS — N184 Chronic kidney disease, stage 4 (severe): Secondary | ICD-10-CM

## 2016-06-25 ENCOUNTER — Inpatient Hospital Stay (HOSPITAL_COMMUNITY): Payer: BLUE CROSS/BLUE SHIELD

## 2016-06-25 ENCOUNTER — Inpatient Hospital Stay (HOSPITAL_COMMUNITY)
Admission: AD | Admit: 2016-06-25 | Discharge: 2016-07-07 | DRG: 673 | Disposition: A | Discharge status: Home / Self Care | Payer: BLUE CROSS/BLUE SHIELD | Source: Ambulatory Visit | Attending: Internal Medicine | Admitting: Internal Medicine

## 2016-06-25 ENCOUNTER — Inpatient Hospital Stay: Admission: RE | Admit: 2016-06-25 | Payer: BLUE CROSS/BLUE SHIELD | Source: Ambulatory Visit

## 2016-06-25 DIAGNOSIS — N184 Chronic kidney disease, stage 4 (severe): Secondary | ICD-10-CM | POA: Diagnosis not present

## 2016-06-25 DIAGNOSIS — I341 Nonrheumatic mitral (valve) prolapse: Secondary | ICD-10-CM | POA: Diagnosis present

## 2016-06-25 DIAGNOSIS — M059 Rheumatoid arthritis with rheumatoid factor, unspecified: Secondary | ICD-10-CM | POA: Diagnosis not present

## 2016-06-25 DIAGNOSIS — N2581 Secondary hyperparathyroidism of renal origin: Secondary | ICD-10-CM | POA: Diagnosis present

## 2016-06-25 DIAGNOSIS — E876 Hypokalemia: Secondary | ICD-10-CM | POA: Diagnosis not present

## 2016-06-25 DIAGNOSIS — Z6830 Body mass index (BMI) 30.0-30.9, adult: Secondary | ICD-10-CM

## 2016-06-25 DIAGNOSIS — D593 Hemolytic-uremic syndrome: Secondary | ICD-10-CM | POA: Diagnosis not present

## 2016-06-25 DIAGNOSIS — R0982 Postnasal drip: Secondary | ICD-10-CM | POA: Diagnosis not present

## 2016-06-25 DIAGNOSIS — M109 Gout, unspecified: Secondary | ICD-10-CM | POA: Diagnosis present

## 2016-06-25 DIAGNOSIS — N185 Chronic kidney disease, stage 5: Secondary | ICD-10-CM | POA: Diagnosis not present

## 2016-06-25 DIAGNOSIS — M349 Systemic sclerosis, unspecified: Secondary | ICD-10-CM | POA: Diagnosis present

## 2016-06-25 DIAGNOSIS — I10 Essential (primary) hypertension: Secondary | ICD-10-CM | POA: Diagnosis not present

## 2016-06-25 DIAGNOSIS — N189 Chronic kidney disease, unspecified: Secondary | ICD-10-CM | POA: Diagnosis not present

## 2016-06-25 DIAGNOSIS — E559 Vitamin D deficiency, unspecified: Secondary | ICD-10-CM | POA: Diagnosis present

## 2016-06-25 DIAGNOSIS — R112 Nausea with vomiting, unspecified: Secondary | ICD-10-CM | POA: Diagnosis not present

## 2016-06-25 DIAGNOSIS — Z8249 Family history of ischemic heart disease and other diseases of the circulatory system: Secondary | ICD-10-CM

## 2016-06-25 DIAGNOSIS — N186 End stage renal disease: Secondary | ICD-10-CM | POA: Diagnosis present

## 2016-06-25 DIAGNOSIS — I120 Hypertensive chronic kidney disease with stage 5 chronic kidney disease or end stage renal disease: Secondary | ICD-10-CM | POA: Diagnosis present

## 2016-06-25 DIAGNOSIS — Z419 Encounter for procedure for purposes other than remedying health state, unspecified: Secondary | ICD-10-CM

## 2016-06-25 DIAGNOSIS — K219 Gastro-esophageal reflux disease without esophagitis: Secondary | ICD-10-CM | POA: Diagnosis present

## 2016-06-25 DIAGNOSIS — R040 Epistaxis: Secondary | ICD-10-CM | POA: Diagnosis not present

## 2016-06-25 DIAGNOSIS — M069 Rheumatoid arthritis, unspecified: Secondary | ICD-10-CM | POA: Diagnosis present

## 2016-06-25 DIAGNOSIS — F419 Anxiety disorder, unspecified: Secondary | ICD-10-CM | POA: Diagnosis present

## 2016-06-25 DIAGNOSIS — Z888 Allergy status to other drugs, medicaments and biological substances status: Secondary | ICD-10-CM

## 2016-06-25 DIAGNOSIS — E669 Obesity, unspecified: Secondary | ICD-10-CM | POA: Diagnosis present

## 2016-06-25 DIAGNOSIS — Z8672 Personal history of thrombophlebitis: Secondary | ICD-10-CM | POA: Diagnosis not present

## 2016-06-25 DIAGNOSIS — B370 Candidal stomatitis: Secondary | ICD-10-CM | POA: Diagnosis not present

## 2016-06-25 DIAGNOSIS — R0602 Shortness of breath: Secondary | ICD-10-CM

## 2016-06-25 DIAGNOSIS — K580 Irritable bowel syndrome with diarrhea: Secondary | ICD-10-CM | POA: Diagnosis present

## 2016-06-25 DIAGNOSIS — Z79899 Other long term (current) drug therapy: Secondary | ICD-10-CM

## 2016-06-25 DIAGNOSIS — M797 Fibromyalgia: Secondary | ICD-10-CM | POA: Diagnosis present

## 2016-06-25 DIAGNOSIS — Z7951 Long term (current) use of inhaled steroids: Secondary | ICD-10-CM

## 2016-06-25 DIAGNOSIS — D631 Anemia in chronic kidney disease: Secondary | ICD-10-CM | POA: Diagnosis present

## 2016-06-25 DIAGNOSIS — M05719 Rheumatoid arthritis with rheumatoid factor of unspecified shoulder without organ or systems involvement: Secondary | ICD-10-CM | POA: Diagnosis not present

## 2016-06-25 DIAGNOSIS — M05711 Rheumatoid arthritis with rheumatoid factor of right shoulder without organ or systems involvement: Secondary | ICD-10-CM | POA: Diagnosis not present

## 2016-06-25 DIAGNOSIS — N179 Acute kidney failure, unspecified: Secondary | ICD-10-CM | POA: Diagnosis present

## 2016-06-25 DIAGNOSIS — D696 Thrombocytopenia, unspecified: Secondary | ICD-10-CM | POA: Diagnosis not present

## 2016-06-25 DIAGNOSIS — Z9889 Other specified postprocedural states: Secondary | ICD-10-CM

## 2016-06-25 DIAGNOSIS — Z992 Dependence on renal dialysis: Secondary | ICD-10-CM

## 2016-06-25 DIAGNOSIS — Z23 Encounter for immunization: Secondary | ICD-10-CM | POA: Diagnosis not present

## 2016-06-25 DIAGNOSIS — N178 Other acute kidney failure: Secondary | ICD-10-CM | POA: Diagnosis not present

## 2016-06-25 HISTORY — DX: Thrombocytopenia, unspecified: D69.6

## 2016-06-25 LAB — URINALYSIS, ROUTINE W REFLEX MICROSCOPIC
Bilirubin Urine: NEGATIVE
GLUCOSE, UA: NEGATIVE mg/dL
Ketones, ur: NEGATIVE mg/dL
LEUKOCYTES UA: NEGATIVE
NITRITE: NEGATIVE
PH: 5 (ref 5.0–8.0)
Protein, ur: >=300 mg/dL — AB
SPECIFIC GRAVITY, URINE: 1.009 (ref 1.005–1.030)

## 2016-06-25 LAB — COMPREHENSIVE METABOLIC PANEL
ALK PHOS: 48 U/L (ref 38–126)
ALT: 11 U/L — ABNORMAL LOW (ref 14–54)
AST: 15 U/L (ref 15–41)
Albumin: 2.9 g/dL — ABNORMAL LOW (ref 3.5–5.0)
Anion gap: 9 (ref 5–15)
BILIRUBIN TOTAL: 0.3 mg/dL (ref 0.3–1.2)
BUN: 88 mg/dL — AB (ref 6–20)
CALCIUM: 8.9 mg/dL (ref 8.9–10.3)
CO2: 18 mmol/L — ABNORMAL LOW (ref 22–32)
CREATININE: 8.33 mg/dL — AB (ref 0.44–1.00)
Chloride: 114 mmol/L — ABNORMAL HIGH (ref 101–111)
GFR calc Af Amer: 5 mL/min — ABNORMAL LOW (ref >60)
GFR, EST NON AFRICAN AMERICAN: 5 mL/min — AB (ref >60)
Glucose, Bld: 104 mg/dL — ABNORMAL HIGH (ref 65–99)
POTASSIUM: 4.7 mmol/L (ref 3.5–5.1)
Sodium: 141 mmol/L (ref 135–145)
TOTAL PROTEIN: 7.1 g/dL (ref 6.5–8.1)

## 2016-06-25 LAB — CBC
HEMATOCRIT: 26 % — AB (ref 36.0–46.0)
HEMOGLOBIN: 8.5 g/dL — AB (ref 12.0–15.0)
MCH: 27.9 pg (ref 26.0–34.0)
MCHC: 32.7 g/dL (ref 30.0–36.0)
MCV: 85.2 fL (ref 78.0–100.0)
Platelets: 203 10*3/uL (ref 150–400)
RBC: 3.05 MIL/uL — AB (ref 3.87–5.11)
RDW: 14.2 % (ref 11.5–15.5)
WBC: 3.2 10*3/uL — ABNORMAL LOW (ref 4.0–10.5)

## 2016-06-25 LAB — DIFFERENTIAL
BASOS ABS: 0 10*3/uL (ref 0.0–0.1)
Basophils Relative: 0 %
Eosinophils Absolute: 0 10*3/uL (ref 0.0–0.7)
Eosinophils Relative: 0 %
LYMPHS ABS: 0.7 10*3/uL (ref 0.7–4.0)
LYMPHS PCT: 20 %
MONOS PCT: 7 %
Monocytes Absolute: 0.2 10*3/uL (ref 0.1–1.0)
NEUTROS PCT: 73 %
Neutro Abs: 2.3 10*3/uL (ref 1.7–7.7)

## 2016-06-25 LAB — IRON AND TIBC
Iron: 59 ug/dL (ref 28–170)
SATURATION RATIOS: 20 % (ref 10.4–31.8)
TIBC: 295 ug/dL (ref 250–450)
UIBC: 236 ug/dL

## 2016-06-25 LAB — PHOSPHORUS: Phosphorus: 6 mg/dL — ABNORMAL HIGH (ref 2.5–4.6)

## 2016-06-25 MED ORDER — CLONAZEPAM 0.5 MG PO TABS: 0.5000 mg | ORAL_TABLET | Freq: Two times a day (BID) | ORAL | Status: DC | PRN

## 2016-06-25 MED ORDER — NEBIVOLOL HCL 10 MG PO TABS
10.0000 mg | ORAL_TABLET | Freq: Every day | ORAL | Status: DC
  Administered 2016-06-25 – 2016-06-26 (×2): 10 mg via ORAL
  Filled 2016-06-25 (×3): qty 1

## 2016-06-25 MED ORDER — ACETAMINOPHEN 325 MG PO TABS
650.0000 mg | ORAL_TABLET | Freq: Four times a day (QID) | ORAL | Status: DC | PRN
Start: 2016-06-25 — End: 2016-07-07
  Administered 2016-06-28 – 2016-07-07 (×7): 650 mg via ORAL
  Filled 2016-06-25 (×7): qty 2

## 2016-06-25 MED ORDER — SODIUM CHLORIDE 0.9 % IV SOLN: INTRAVENOUS | Status: DC

## 2016-06-25 MED ORDER — DARBEPOETIN ALFA 200 MCG/0.4ML IJ SOSY
200.0000 ug | PREFILLED_SYRINGE | INTRAMUSCULAR | Status: DC
  Administered 2016-06-26: 200 ug via SUBCUTANEOUS
  Filled 2016-06-25: qty 0.4

## 2016-06-25 MED ORDER — KIDNEY FAILURE BOOK
Freq: Once | Status: AC
  Administered 2016-06-25: 18:00:00
  Filled 2016-06-25: qty 1

## 2016-06-25 MED ORDER — LORATADINE 10 MG PO TABS
10.0000 mg | ORAL_TABLET | Freq: Every day | ORAL | Status: DC
  Administered 2016-06-26 – 2016-07-07 (×11): 10 mg via ORAL
  Filled 2016-06-25 (×13): qty 1

## 2016-06-25 MED ORDER — SODIUM CHLORIDE 0.9 % IV SOLN
510.0000 mg | Freq: Once | INTRAVENOUS | Status: AC
  Administered 2016-06-25: 510 mg via INTRAVENOUS
  Filled 2016-06-25: qty 17

## 2016-06-25 MED ORDER — ACETAMINOPHEN 650 MG RE SUPP: 650.0000 mg | Freq: Four times a day (QID) | RECTAL | Status: DC | PRN

## 2016-06-25 MED ORDER — HEPARIN SODIUM (PORCINE) 5000 UNIT/ML IJ SOLN
5000.0000 [IU] | Freq: Three times a day (TID) | INTRAMUSCULAR | Status: DC
  Filled 2016-06-25: qty 1

## 2016-06-25 MED ORDER — HYDRALAZINE HCL 20 MG/ML IJ SOLN: 10.0000 mg | INTRAMUSCULAR | Status: DC | PRN

## 2016-06-25 MED ORDER — IPRATROPIUM-ALBUTEROL 0.5-2.5 (3) MG/3ML IN SOLN: 3.0000 mL | Freq: Four times a day (QID) | RESPIRATORY_TRACT | Status: DC | PRN

## 2016-06-25 MED ORDER — ONDANSETRON HCL 4 MG PO TABS
4.0000 mg | ORAL_TABLET | Freq: Four times a day (QID) | ORAL | Status: DC | PRN
Start: 2016-06-25 — End: 2016-06-26

## 2016-06-25 MED ORDER — ONDANSETRON HCL 4 MG/2ML IJ SOLN
4.0000 mg | Freq: Four times a day (QID) | INTRAMUSCULAR | Status: DC | PRN
  Administered 2016-06-28 – 2016-07-01 (×3): 4 mg via INTRAVENOUS
  Filled 2016-06-25: qty 2

## 2016-06-25 MED ORDER — ALBUTEROL SULFATE (2.5 MG/3ML) 0.083% IN NEBU
2.5000 mg | INHALATION_SOLUTION | Freq: Four times a day (QID) | RESPIRATORY_TRACT | Status: DC
  Filled 2016-06-25: qty 3

## 2016-06-25 NOTE — Progress Notes (Signed)
Called regarding direct admission from Dr. Vanetta Nunez of nephrology. [name] is a 61 year old female with pmh RA, scleroderma, MVP; who it is requested admission for acute renal failure. Creatinine previously had been documented at 4 prior to her initial evaluation by the nephrology yesterday. Lab work from yesterday revealed a creatinine up to 8. Dr. Moshe Nunez will discuss with Dr. surgery and nephrology today. She recommended checking a renal ultrasounds and she will send lab work from office. Admission as an inpatient for acute renal failure and possible need of starting hemodialysis.

## 2016-06-25 NOTE — Consult Note (Signed)
[facility] Renal Consultation Note  Requesting MD: Tamala Julian, R Indication for Consultation: rapidly progressive renal insufficiency   HPI:   Katie Nunez is a pleasant 61 year old black female with past medical history significant for systemic sclerosis, not clinical scleroderma, possible history of rheumatoid arthritis and fibromyalgia as well as gout, hypertension followed chronically by a rheumatologist Dr. Boris Lown in [place]. In August 2016 patient's creatinine was noted to be 1.16 she had had proteinuria at less than a gram. Her rheumatologist had worked her up at the time she had a negative ANCA and negative serologies for lupus. He continue to follow her for this proteinuria. In December 2016 creatinine was 1.69 and in May 2017 creatinine was 1.58. She was then seen in September 2017 creatinine was 2.2. Proteinuria remained non-nephrotic in nature and she did not have hematuria. There was an a creatinine done in January 2018 that was 3.62. Then, on May 18 of 2018 creatinine was 4.71. At this time an outpatient nephrology consultation was requested. She was seen at [facility] on 6/14. She was clinically fairly well only complaining of fatigue that she doesn't think is any worse than her usual. She is on it ARB but her blood pressure is not been low. She has taken ibuprofen but not an excessive amount. Other labs of note on 5/18 said rate of 89, CK 35, albumin 3.7 and hemoglobin of 9. Again urine today shows proteinuria at less than 2 g and no hematuria. She has not had any urinary symptoms, no edema, there is no family history of any kidney problems.  My plan yesterday was to stop benicar and get a renal ultrasound on 6/15 and to proceed quickly with a renal biopsy- I also gave her a script for prednisone 60 mg daily.  When I got the labs and saw that patient had a creatinine of nearly 9 I felt it best to admit her to expedite the workup or to make dialysis preparations sooner rather than later    No results found for: CREATININE   PMHx:   Past Medical History:  Diagnosis Date  . Achalasia   . Allergy   . Dysplasia of cervix, low grade (CIN 1)   . Fibromyalgia   . Gout   . Hypertension   . IBS (irritable bowel syndrome)   . MVP (mitral valve prolapse)   . RA (rheumatoid arthritis) (HCC)    FOLLOWED BY DR. SHANAHAN  . Raynaud's disease   . Scleroderma ([place])   . Seasonal allergies   . Tubular adenoma 01/08/2008   CECUM  . Vitamin D deficiency     Past Surgical History:  Procedure Laterality Date  . ANKLE SURGERY    . CESAREAN SECTION    . CO2 LASER OF CERVIX    . COLONOSCOPY W/ BIOPSIES  01/08/2008  . MYOMECTOMY    . PELVIC LAPAROSCOPY  2011  . superficial thrombophlebitis Left 07-2014    Family Hx:  Family History  Problem Relation Age of Onset  . Hypertension Mother   . Diabetes Mother   . Heart disease Father   . Hypertension Maternal Aunt   . Diabetes Maternal Grandmother   . Heart disease Paternal Grandfather   . Cerebral palsy Cousin        1ST COUSIN?  . Diabetes Paternal Grandmother     Social History:  reports that she has never smoked. She has never used smokeless tobacco. She reports that she does not drink alcohol or use drugs.  Allergies:  Allergies  Allergen Reactions  . Savella [Milnacipran Hcl]     Medications: Prior to Admission medications   Medication Sig Start Date End Date Taking? Authorizing Provider  Acetaminophen (TYLENOL PO) Take 200 mg by mouth daily as needed.     [provider]  allopurinol (ZYLOPRIM) 100 MG tablet Take 1 tablet (100 mg total) by mouth daily. 10/17/15   [name], DPM  amLODipine (NORVASC) 5 MG tablet Take 5 mg by mouth daily.    [provider]  B-12, METHYLCOBALAMIN, SL Place under the tongue.    [provider]  budesonide (RHINOCORT ALLERGY) 32 MCG/ACT nasal spray Place 2 sprays into both nostrils daily.    [provider]  calcium carbonate (OS-CAL)  600 MG TABS Take 600 mg by mouth 2 (two) times daily with a meal.      [provider]  cholecalciferol (VITAMIN D) 1000 UNITS tablet Take 2,000 Units by mouth 1 day or 1 dose.     [provider]  clonazePAM (KLONOPIN) 0.5 MG tablet Take 0.5 mg by mouth 2 (two) times daily as needed.      [provider]  Cyanocobalamin (VITAMIN B-12 IJ) Inject 1 each as directed every 30 (thirty) days.    [provider]  cyclobenzaprine (FLEXERIL) 10 MG tablet Take 10 mg by mouth 3 (three) times daily as needed for muscle spasms.    [provider]  esomeprazole (NEXIUM) 40 MG capsule Take 40 mg by mouth daily at 12 noon.    [provider]  fluticasone (FLONASE) 50 MCG/ACT nasal spray Place 1 spray into the nose daily. Reported on 01/08/2015 12/15/09   [provider]  furosemide (LASIX) 20 MG tablet 20 mg every other day. 01/08/16   [provider]  GuaiFENesin (MUCINEX PO) Take 400 mg by mouth.    [provider]  IBUPROFEN PO Take 200 mg by mouth. Takes 200 mg to 400 mg daily    [provider]  levocetirizine (XYZAL) 5 MG tablet Take 1 tablet (5 mg total) by mouth daily. 11/20/14   Gean Quint, MD  meloxicam (MOBIC) 15 MG tablet Take 1 tablet (15 mg total) by mouth daily. Patient not taking: Reported on 01/27/2016 10/10/15   [name], DPM  methylPREDNISolone (MEDROL DOSEPAK) 4 MG TBPK tablet Take as instructed Patient not taking: Reported on 01/27/2016 08/26/15   Landis Martins, DPM  methylPREDNISolone (MEDROL DOSEPAK) 4 MG TBPK tablet follow package directions Patient not taking: Reported on 01/27/2016 11/21/15   [name], DPM  methylPREDNISolone (MEDROL DOSEPAK) 4 MG TBPK tablet follow package directions 02/12/16   [name], DPM  metroNIDAZOLE (METROGEL) 0.75 % gel Apply 1 application topically daily as needed. 12/15/09   [provider]  Naproxen Sodium (ALEVE PO) Take by mouth.     [provider]  olmesartan (BENICAR) 40 MG tablet 40 mg daily. 01/08/16   [provider]  olmesartan-hydrochlorothiazide (BENICAR HCT) 40-12.5 MG per tablet Take 1 tablet by mouth daily.    [provider]  Olopatadine HCl 0.6 % SOLN Use 1-2 sprays in each nostril once daily in the evening for congestion. 11/20/14   Gean Quint, MD  Probiotic Product (PROBIOTIC DAILY PO) Take by mouth.    [provider]    I have reviewed the patient's current medications.  Labs: No results found for this or any previous visit (from the past 48 hour(s)).   ROS:  A comprehensive  review of systems was negative except for: Constitutional: positive for fatigue and diffuse joint aches  Physical Exam: There were no vitals filed for this visit.  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  Need to worry about a rheumatologic related kidney condition- I have sent off again lupus serologies, anca and antigbm at [place] office - results pending.  This really does not seem like a scleroderma crisis.  My plan was to work this up with a kidney biopsly but am worried also that the horse is out of the barn and that her kidney function is not salvageable.  Renal ultrasound will help guide [place].  My plan also was to treat her with steroids but will leave that plan  to Dr. Jimmy Footman- the renal consultant tin the hospital.  Fortunately she is not uremic but if it is felt this is not reversible- she will be dialysis requiring I am afraid shortly  2. Hypertension/volume  - blood pressure is a little hish but not malignant- was going to be on amlodipine 10 daily and bystolic 10 daily after I stopped her ARB 3. Anemia  - present and likely due to CKD- supportive care for now   Benedict A 06/25/2016, 11:23 AM

## 2016-06-25 NOTE — H&P (Signed)
History and Physical    Katie Nunez JOI:[id number] DOB: 1955/12/17 DOA: 06/25/2016  Referring MD/NP/PA: Dr. Vanetta Mulders PCP: [name], MD  Patient coming from: Home  Chief Complaint: Abnormal lab work  HPI: Katie Nunez is a 61 y.o. female with medical history significant of scleroderma, rheumatoid arthritis, fibromyalgia, and IBS, who presents after being instructed by her nephrologist of recent abnormal lab work. Patient states that she's been in her normal state of health and complaints of some generalized achiness which is not unusual for her. She has follow-up with Dr. Moshe Cipro of nephrology for recent abnormal lab work which revealed an elevated creatinine of 4.71 in May 2018. However after lab work was obtained yesterday patient started to have a creatinine of 8.19 with a BUN of 83. Patient denies having any nausea, vomiting, abdominal pain, dysuria, change in urine output, fever, chills, or shortness of breath. Patient notes that she's been on olemsartan- HCTZ until recently. Patient is followed by Dr. Boris Lown of rheumatology.   ED Course: Not applicable.   Review of Systems: As per HPI otherwise 10 point review of systems negative.   Past Medical History:  Diagnosis Date  . Achalasia   . Allergy   . Dysplasia of cervix, low grade (CIN 1)   . Fibromyalgia   . Gout   . Hypertension   . IBS (irritable bowel syndrome)   . MVP (mitral valve prolapse)   . RA (rheumatoid arthritis) (HCC)    FOLLOWED BY DR. SHANAHAN  . Raynaud's disease   . Scleroderma ([place])   . Seasonal allergies   . Tubular adenoma 01/08/2008   CECUM  . Vitamin D deficiency     Past Surgical History:  Procedure Laterality Date  . ANKLE SURGERY    . CESAREAN SECTION    . CO2 LASER OF CERVIX    . COLONOSCOPY W/ BIOPSIES  01/08/2008  . MYOMECTOMY    . PELVIC LAPAROSCOPY  2011  . superficial thrombophlebitis Left 07-2014     reports that she has never smoked. She has never  used smokeless tobacco. She reports that she does not drink alcohol or use drugs.  Allergies  Allergen Reactions  . Savella [Milnacipran Hcl]     Family History  Problem Relation Age of Onset  . Hypertension Mother   . Diabetes Mother   . Heart disease Father   . Hypertension Maternal Aunt   . Diabetes Maternal Grandmother   . Heart disease Paternal Grandfather   . Cerebral palsy Cousin        1ST COUSIN?  . Diabetes Paternal Grandmother     Prior to Admission medications   Medication Sig Start Date End Date Taking? Authorizing Provider  Acetaminophen (TYLENOL PO) Take 200 mg by mouth daily as needed.     [provider]  allopurinol (ZYLOPRIM) 100 MG tablet Take 1 tablet (100 mg total) by mouth daily. 10/17/15   [name], DPM  amLODipine (NORVASC) 5 MG tablet Take 5 mg by mouth daily.    [provider]  B-12, METHYLCOBALAMIN, SL Place under the tongue.    [provider]  budesonide (RHINOCORT ALLERGY) 32 MCG/ACT nasal spray Place 2 sprays into both nostrils daily.    [provider]  calcium carbonate (OS-CAL) 600 MG TABS Take 600 mg by mouth 2 (two) times daily with a meal.      [provider]  cholecalciferol (VITAMIN D) 1000 UNITS tablet Take 2,000 Units by mouth 1 day or  1 dose.     [provider]  clonazePAM (KLONOPIN) 0.5 MG tablet Take 0.5 mg by mouth 2 (two) times daily as needed.      [provider]  Cyanocobalamin (VITAMIN B-12 IJ) Inject 1 each as directed every 30 (thirty) days.    [provider]  cyclobenzaprine (FLEXERIL) 10 MG tablet Take 10 mg by mouth 3 (three) times daily as needed for muscle spasms.    [provider]  esomeprazole (NEXIUM) 40 MG capsule Take 40 mg by mouth daily at 12 noon.    [provider]  fluticasone (FLONASE) 50 MCG/ACT nasal spray Place 1 spray into the nose daily. Reported on 01/08/2015 12/15/09   [provider]  furosemide  (LASIX) 20 MG tablet 20 mg every other day. 01/08/16   [provider]  GuaiFENesin (MUCINEX PO) Take 400 mg by mouth.    [provider]  IBUPROFEN PO Take 200 mg by mouth. Takes 200 mg to 400 mg daily    [provider]  levocetirizine (XYZAL) 5 MG tablet Take 1 tablet (5 mg total) by mouth daily. 11/20/14   Gean Quint, MD  meloxicam (MOBIC) 15 MG tablet Take 1 tablet (15 mg total) by mouth daily. Patient not taking: Reported on 01/27/2016 10/10/15   [name], DPM  methylPREDNISolone (MEDROL DOSEPAK) 4 MG TBPK tablet Take as instructed Patient not taking: Reported on 01/27/2016 08/26/15   Landis Martins, DPM  methylPREDNISolone (MEDROL DOSEPAK) 4 MG TBPK tablet follow package directions Patient not taking: Reported on 01/27/2016 11/21/15   [name], DPM  methylPREDNISolone (MEDROL DOSEPAK) 4 MG TBPK tablet follow package directions 02/12/16   [name], DPM  metroNIDAZOLE (METROGEL) 0.75 % gel Apply 1 application topically daily as needed. 12/15/09   [provider]  Naproxen Sodium (ALEVE PO) Take by mouth.    [provider]  olmesartan (BENICAR) 40 MG tablet 40 mg daily. 01/08/16   [provider]  olmesartan-hydrochlorothiazide (BENICAR HCT) 40-12.5 MG per tablet Take 1 tablet by mouth daily.    [provider]  Olopatadine HCl 0.6 % SOLN Use 1-2 sprays in each nostril once daily in the evening for congestion. 11/20/14   Gean Quint, MD  Probiotic Product (PROBIOTIC DAILY PO) Take by mouth.    [provider]    Physical Exam: Constitutional: Female in NAD, calm, comfortable Vitals:   06/25/16 1200 06/25/16 1541  BP: (!) 158/67   Pulse: 65   Resp: 16   Temp: 98.6 F (37 C)   SpO2: 99%   Weight:  83.3 kg (183 lb 11.2 oz)  Height:  5\' 5"  (1.651 m)   Eyes: PERRL, lids and conjunctivae normal ENMT: Mucous membranes are moist. Posterior pharynx clear of any exudate or lesions.   Neck:  normal, supple, no masses, no thyromegaly Respiratory: clear to auscultation bilaterally, no wheezing, no crackles. Normal respiratory effort. No accessory muscle use.  Cardiovascular: Regular rate and rhythm, no murmurs / rubs / gallops. No extremity edema. 2+ pedal pulses. No carotid bruits.  Abdomen: no tenderness, no masses palpated. No hepatosplenomegaly. Bowel sounds positive.  Musculoskeletal: no clubbing / cyanosis. No joint deformity upper and lower extremities. Good ROM, no contractures. Normal muscle tone.  Skin: no rashes, lesions, ulcers. No induration Neurologic: CN 2-12 grossly intact. Sensation intact, DTR normal. Strength 5/5 in all 4.  Psychiatric: Normal judgment and insight. Alert and oriented x 3. Normal mood.     Labs  on Admission: I have personally reviewed following labs and imaging studies  CBC: No results for input(s): WBC, NEUTROABS, HGB, HCT, MCV, PLT in the last 168 hours. Basic Metabolic Panel: No results for input(s): NA, K, CL, CO2, GLUCOSE, BUN, CREATININE, CALCIUM, MG, PHOS in the last 168 hours. GFR: CrCl cannot be calculated (No order found.). Liver Function Tests: No results for input(s): AST, ALT, ALKPHOS, BILITOT, PROT, ALBUMIN in the last 168 hours. No results for input(s): LIPASE, AMYLASE in the last 168 hours. No results for input(s): AMMONIA in the last 168 hours. Coagulation Profile: No results for input(s): INR, PROTIME in the last 168 hours. Cardiac Enzymes: No results for input(s): CKTOTAL, CKMB, CKMBINDEX, TROPONINI in the last 168 hours. BNP (last 3 results) No results for input(s): PROBNP in the last 8760 hours. HbA1C: No results for input(s): HGBA1C in the last 72 hours. CBG: No results for input(s): GLUCAP in the last 168 hours. Lipid Profile: No results for input(s): CHOL, HDL, LDLCALC, TRIG, CHOLHDL, LDLDIRECT in the last 72 hours. Thyroid Function Tests: No results for input(s): TSH, T4TOTAL, FREET4, T3FREE, THYROIDAB in the  last 72 hours. Anemia Panel: No results for input(s): VITAMINB12, FOLATE, FERRITIN, TIBC, IRON, RETICCTPCT in the last 72 hours. Urine analysis: No results found for: COLORURINE, APPEARANCEUR, LABSPEC, PHURINE, GLUCOSEU, HGBUR, BILIRUBINUR, KETONESUR, PROTEINUR, UROBILINOGEN, NITRITE, LEUKOCYTESUR Sepsis Labs: No results found for this or any previous visit (from the past 240 hour(s)).   Radiological Exams on Admission: No results found.    Assessment/Plan Acute renal failure: Acute. Patient presents with rapidly rising creatinine now up to 8.33 with BUN of 88. On the differential is scleroderma-induced renal crisis, but thought less likely by his nephrologist Dr. Moshe Cipro. - Admit to MedSurg bed - Check renal ultrasound - IVF NS at 100 ml/hr - Hold nephrotoxic agents  - NPO after midnight for possible need of biopsy in a.m - Appreciated Nephrology Consultative services, will follow-up for further recommendations   Essential hypertension - Continue Bystolic - Hydralazine prn elevated BP  Anxiety - Continue Klonopin prn   H/O of rheumatoid arthritis and scleroderma - May want to also discuss case with patient's rheumatologist Dr. Boris Lown   Anemia of chronic kidney disease: Hemoglobin 8.5 on admission. Hemoglobin is otherwise noted to be within normal.  - Continue monitor  DVT prophylaxis: heparin   Code Status:  Full Family Communication: Discussed plan of care with the patient and family present at bedside Disposition Plan:  Likely discharge home once medically stable  Consults called: Nephrology Admission status: Inpatient Norval Morton MD [facility] Pager [phone]  If 7PM-7AM, please contact night-coverage [email or web address] Password [facility]  06/25/2016, 11:57 AM

## 2016-06-25 NOTE — Progress Notes (Signed)
Date: 06/25/2016 Time: 1030  New Admission Note:   Arrival Method:  Direct admit. Escorted to floor via wheelchair.  Mental Orientation: Alert and oriented x 4 Telemetry: n/a Assessment: completed Skin: intact, verified by 2nd RN IV: None, L AC placed after arrival to floor, but removed d/t order to save L arm. No IV order at this time Pain: denies Tubes: non Safety Measures: Fall plan discussed, signed. Low fall risk d/t medications.  Admission: completed [facility] Orientation: completed Family: son at bedside Personal Belongings: clothing, tablet, rings/earings  Orders have been reviewed and implemented. Will continue to monitor the patient. Call light has been placed within reach and bed alarm has been activated.   Manya Silvas, RN MSN CNE [facility]  Phone number: [phone]

## 2016-06-26 ENCOUNTER — Inpatient Hospital Stay (HOSPITAL_COMMUNITY): Payer: BLUE CROSS/BLUE SHIELD

## 2016-06-26 ENCOUNTER — Encounter (HOSPITAL_COMMUNITY): Payer: Self-pay | Admitting: *Deleted

## 2016-06-26 DIAGNOSIS — N184 Chronic kidney disease, stage 4 (severe): Secondary | ICD-10-CM

## 2016-06-26 DIAGNOSIS — N179 Acute kidney failure, unspecified: Secondary | ICD-10-CM

## 2016-06-26 DIAGNOSIS — I10 Essential (primary) hypertension: Secondary | ICD-10-CM

## 2016-06-26 DIAGNOSIS — M05719 Rheumatoid arthritis with rheumatoid factor of unspecified shoulder without organ or systems involvement: Secondary | ICD-10-CM

## 2016-06-26 LAB — HEPATITIS B SURFACE ANTIBODY,QUALITATIVE
HEP B S AB: NONREACTIVE
Hep B S Ab: NONREACTIVE

## 2016-06-26 LAB — CBC
HEMATOCRIT: 25.9 % — AB (ref 36.0–46.0)
Hemoglobin: 8.4 g/dL — ABNORMAL LOW (ref 12.0–15.0)
MCH: 27.6 pg (ref 26.0–34.0)
MCHC: 32.4 g/dL (ref 30.0–36.0)
MCV: 85.2 fL (ref 78.0–100.0)
PLATELETS: 212 10*3/uL (ref 150–400)
RBC: 3.04 MIL/uL — ABNORMAL LOW (ref 3.87–5.11)
RDW: 14.1 % (ref 11.5–15.5)
WBC: 3.8 10*3/uL — AB (ref 4.0–10.5)

## 2016-06-26 LAB — RENAL FUNCTION PANEL
Albumin: 2.9 g/dL — ABNORMAL LOW (ref 3.5–5.0)
Anion gap: 10 (ref 5–15)
BUN: 90 mg/dL — AB (ref 6–20)
CHLORIDE: 111 mmol/L (ref 101–111)
CO2: 18 mmol/L — ABNORMAL LOW (ref 22–32)
CREATININE: 8.71 mg/dL — AB (ref 0.44–1.00)
Calcium: 8.8 mg/dL — ABNORMAL LOW (ref 8.9–10.3)
GFR, EST AFRICAN AMERICAN: 5 mL/min — AB (ref >60)
GFR, EST NON AFRICAN AMERICAN: 4 mL/min — AB (ref >60)
Glucose, Bld: 90 mg/dL (ref 65–99)
POTASSIUM: 4.1 mmol/L (ref 3.5–5.1)
Phosphorus: 6.1 mg/dL — ABNORMAL HIGH (ref 2.5–4.6)
Sodium: 139 mmol/L (ref 135–145)

## 2016-06-26 LAB — HEPATITIS B CORE ANTIBODY, IGM: HEP B C IGM: NEGATIVE

## 2016-06-26 LAB — HEPATITIS B SURFACE ANTIGEN
HEP B S AG: NEGATIVE
Hepatitis B Surface Ag: NEGATIVE

## 2016-06-26 LAB — HCV COMMENT:

## 2016-06-26 LAB — PLATELET FUNCTION ASSAY: Collagen / Epinephrine: 166 seconds (ref 0–193)

## 2016-06-26 LAB — HEPATITIS C ANTIBODY (REFLEX)
HCV Ab: <0.1 s/co ratio (ref 0.0–0.9)
HCV Ab: <0.1 s/co ratio (ref 0.0–0.9)

## 2016-06-26 LAB — PARATHYROID HORMONE, INTACT (NO CA): PTH: 422 pg/mL — ABNORMAL HIGH (ref 15–65)

## 2016-06-26 LAB — APTT: aPTT: 29 seconds (ref 24–36)

## 2016-06-26 LAB — PROTIME-INR
INR: 1.08
Prothrombin Time: 14 seconds (ref 11.4–15.2)

## 2016-06-26 MED ORDER — HEPARIN SODIUM (PORCINE) 5000 UNIT/ML IJ SOLN: 5000.0000 [IU] | Freq: Three times a day (TID) | INTRAMUSCULAR | Status: DC

## 2016-06-26 NOTE — Consult Note (Signed)
[facility] [place]  Patient name: Katie Nunez MRN: [id number] DOB: 02-04-1955 Sex: female    HPI: Katie Nunez is a 61 y.o. female seen for discussion of AV access for hemodialysis. She presented with advanced kidney failure and is now need for access. I'd seen her as an outpatient in the past for superficial thrombophlebitis. He has never had hemodialysis access and has never had central catheters. No history of pacemaker placement.  Past Medical History:  Diagnosis Date  . Achalasia   . Allergy   . Dysplasia of cervix, low grade (CIN 1)   . Fibromyalgia   . Gout   . Hypertension   . IBS (irritable bowel syndrome)   . MVP (mitral valve prolapse)   . RA (rheumatoid arthritis) (HCC)    FOLLOWED BY DR. SHANAHAN  . Raynaud's disease   . Scleroderma ([place])   . Seasonal allergies   . Tubular adenoma 01/08/2008   CECUM  . Vitamin D deficiency     Family History  Problem Relation Age of Onset  . Hypertension Mother   . Diabetes Mother   . Heart disease Father   . Hypertension Maternal Aunt   . Diabetes Maternal Grandmother   . Heart disease Paternal Grandfather   . Cerebral palsy Cousin        1ST COUSIN?  . Diabetes Paternal Grandmother     SOCIAL HISTORY: Social History  Substance Use Topics  . Smoking status: Never Smoker  . Smokeless tobacco: Never Used  . Alcohol use No    Allergies  Allergen Reactions  . Savella [Milnacipran Hcl] Other (See Comments)    Unknown    Current Facility-Administered Medications  Medication Dose Route Frequency Provider Last Rate Last Dose  . acetaminophen (TYLENOL) tablet 650 mg  650 mg Oral Q6H PRN Norval Morton, MD       Or  . acetaminophen (TYLENOL) suppository 650 mg  650 mg Rectal Q6H PRN Fuller Plan A, MD      . clonazePAM (KLONOPIN) tablet 0.5 mg  0.5 mg Oral BID PRN Fuller Plan A, MD      . Darbepoetin Alfa (ARANESP) injection 200 mcg  200  mcg Subcutaneous Q Sat-1800 Deterding, James, MD      . hydrALAZINE (APRESOLINE) injection 10 mg  10 mg Intravenous Q4H PRN [name], Rondell A, MD      . ipratropium-albuterol (DUONEB) 0.5-2.5 (3) MG/3ML nebulizer solution 3 mL  3 mL Nebulization Q6H PRN [name], Rondell A, MD      . loratadine (CLARITIN) tablet 10 mg  10 mg Oral Daily [name], Rondell A, MD      . nebivolol (BYSTOLIC) tablet 10 mg  10 mg Oral Daily Tamala Julian, Rondell A, MD   10 mg at 06/25/16 1735  . ondansetron (ZOFRAN) tablet 4 mg  4 mg Oral Q6H PRN Norval Morton, MD       Or  . ondansetron (ZOFRAN) injection 4 mg  4 mg Intravenous Q6H PRN Norval Morton, MD        REVIEW OF SYSTEMS:  [X]  denotes positive finding, [ ]  denotes negative finding Cardiac  Comments:  Chest pain or chest pressure:    Shortness of breath upon exertion:    Short of breath when lying flat:    Irregular heart rhythm:        Vascular    Pain in calf, thigh, or hip brought on by ambulation:    Pain in feet  at night that wakes you up from your sleep:     Blood clot in your veins:    Leg swelling:  x         PHYSICAL EXAM: Vitals:   06/25/16 1541 06/25/16 2139 06/26/16 0443 06/26/16 0700  BP:  137/65 131/68 126/65  Pulse:  66 63 (!) 59  Resp:  17 18 18   Temp:  98.6 F (37 C) 97.7 F (36.5 C) 98.6 F (37 C)  TempSrc:  Oral Oral Oral  SpO2:  98% 97% 98%  Weight: 183 lb 11.2 oz (83.3 kg) 183 lb 10.3 oz (83.3 kg)    Height: 5\' 5"  (1.651 m)       GENERAL: The patient is a well-nourished female, in no acute distress. The vital signs are documented above. CARDIOVASCULAR: 2+ radial pulses bilaterally. Does have good caliber left arm cephalic vein at the antecubital space PULMONARY: There is good air exchange  MUSCULOSKELETAL: There are no major deformities or cyanosis. NEUROLOGIC: No focal weakness or paresthesias are detected. SKIN: There are no ulcers or rashes noted. PSYCHIATRIC: The patient has a normal affect.  DATA:  Vein mapping  pending  MEDICAL ISSUES: Had long discussion with patient regarding hemodialysis access via catheter, AV fistula and AV graft. Recommended placement of a catheter and left arm access. Physical exam she appears to be good candidate for fistula. We'll confirm this with vein map. We'll schedule surgery for Monday.    Rosetta Posner, MD FACS [facility] Tel [phone] Pager [phone]

## 2016-06-26 NOTE — Progress Notes (Signed)
@IPLOG         PROGRESS NOTE                                                                                                                                                                                                             Patient Demographics:    Katie Nunez, is a 61 y.o. female, DOB - 07/29/1955, SEG:[id number]  Admit date - 06/25/2016   Admitting Physician Norval Morton, MD  Outpatient Primary MD for the patient is [name], MD  LOS - 1  No chief complaint on file.      Brief Narrative  Katie Nunez is a 61 y.o. female with medical history significant of scleroderma, rheumatoid arthritis, fibromyalgia, and IBS, who presents after being instructed by her nephrologist of recent abnormal lab work, she had recently seen Dr. Moshe Cipro nephrologist for rising creatinine levels, her creatinine last in May 2018 was 4.7, came to the ER after her creatinine was found to be close to 8 by PCP.   Subjective:    Katie Nunez today has, No headache, No chest pain, No abdominal pain - No Nausea, No new weakness tingling or numbness, No Cough - SOB.    Assessment  & Plan :      1.Likely CK D5/ESRD. Renal following, due for renal biopsy, hold nephrotoxins including ARB or diuretics, renal ultrasound shows intrinsic kidney disease, per nephrology patient will undergo biopsy however most likely she is close to dialysis, vascular surgery also on board for access placement. Currently no uremic symptoms.  2. History of rheumatoid arthritis and scleroderma. This is not scleroderma renal crisis per nephrology, patient follows with Dr. Boris Lown rheumatologist at [facility] will follow with them post discharge.  3. Hypertension. Stable on Bystolic for now continue to monitor.  4. Anemia of chronic disease. Stable we'll continue to monitor.    Diet : Diet renal with fluid restriction Fluid restriction: 1200 mL Fluid; Room service appropriate? Yes; Fluid consistency: Thin Diet  NPO time specified Except for: Sips with Meds    Family Communication  :  None  Code Status :  Full  Disposition Plan  :  TBD  Consults  :  Renal, VVS, IR  Procedures  :    Renal ultrasound. Consistent with intrinsic renal disease  Renal biopsy    DVT Prophylaxis  :   SCD  Lab Results  Component Value Date   PLT 212 06/26/2016    Inpatient Medications  Scheduled Meds: . darbepoetin (ARANESP) injection - NON-DIALYSIS  200 mcg  Subcutaneous Q Sat-1800  . loratadine  10 mg Oral Daily  . nebivolol  10 mg Oral Daily   Continuous Infusions: PRN Meds:.acetaminophen **OR** acetaminophen, clonazePAM, hydrALAZINE, ipratropium-albuterol, ondansetron **OR** ondansetron (ZOFRAN) IV  Antibiotics  :    Anti-infectives    None         Objective:   Vitals:   06/25/16 1541 06/25/16 2139 06/26/16 0443 06/26/16 0700  BP:  137/65 131/68 126/65  Pulse:  66 63 (!) 59  Resp:  17 18 18   Temp:  98.6 F (37 C) 97.7 F (36.5 C) 98.6 F (37 C)  TempSrc:  Oral Oral Oral  SpO2:  98% 97% 98%  Weight: 83.3 kg (183 lb 11.2 oz) 83.3 kg (183 lb 10.3 oz)    Height: 5\' 5"  (1.651 m)       Wt Readings from Last 3 Encounters:  06/25/16 83.3 kg (183 lb 10.3 oz)  01/27/16 84.8 kg (187 lb)  11/06/15 87.2 kg (192 lb 4.8 oz)     Intake/Output Summary (Last 24 hours) at 06/26/16 1157 Last data filed at 06/26/16 0617  Gross per 24 hour  Intake              597 ml  Output             1100 ml  Net             -503 ml     Physical Exam  Awake Alert, Oriented X 3, No new F.N deficits, Normal affect A[place].AT,PERRAL Supple Neck,No JVD, No cervical lymphadenopathy appriciated.  Symmetrical Chest wall movement, Good air movement bilaterally, CTAB RRR,No Gallops,Rubs or new Murmurs, No Parasternal Heave +ve B.Sounds, Abd Soft, No tenderness, No organomegaly appriciated, No rebound - guarding or rigidity. No Cyanosis, Clubbing or edema, No new Rash or bruise      Data Review:     CBC  Recent Labs Lab 06/25/16 1458 06/26/16 0431  WBC 3.2* 3.8*  HGB 8.5* 8.4*  HCT 26.0* 25.9*  PLT 203 212  MCV 85.2 85.2  MCH 27.9 27.6  MCHC 32.7 32.4  RDW 14.2 14.1  LYMPHSABS 0.7  --   MONOABS 0.2  --   EOSABS 0.0  --   BASOSABS 0.0  --     Chemistries   Recent Labs Lab 06/25/16 1458 06/26/16 0431  NA 141 139  K 4.7 4.1  CL 114* 111  CO2 18* 18*  GLUCOSE 104* 90  BUN 88* 90*  CREATININE 8.33* 8.71*  CALCIUM 8.9 8.8*  AST 15  --   ALT 11*  --   ALKPHOS 48  --   BILITOT 0.3  --    ------------------------------------------------------------------------------------------------------------------ No results for input(s): CHOL, HDL, LDLCALC, TRIG, CHOLHDL, LDLDIRECT in the last 72 hours.  No results found for: HGBA1C ------------------------------------------------------------------------------------------------------------------ No results for input(s): TSH, T4TOTAL, T3FREE, THYROIDAB in the last 72 hours.  Invalid input(s): FREET3 ------------------------------------------------------------------------------------------------------------------  Recent Labs  06/25/16 1458  TIBC 295  IRON 59    Coagulation profile No results for input(s): INR, PROTIME in the last 168 hours.  No results for input(s): DDIMER in the last 72 hours.  Cardiac Enzymes No results for input(s): CKMB, TROPONINI, MYOGLOBIN in the last 168 hours.  Invalid input(s): CK ------------------------------------------------------------------------------------------------------------------ No results found for: BNP  Micro Results No results found for this or any previous visit (from the past 240 hour(s)).  Radiology Reports US Renal  Result Date: 06/25/2016 CLINICAL DATA:  Acute renal failure. EXAM: RENAL / URINARY  TRACT ULTRASOUND COMPLETE COMPARISON:  None. FINDINGS: Right Kidney: Length: 10.9 cm. Diffusely increased echogenicity of the cortex. Mildly complicated cyst is  seen in the superior pole of the right kidney measuring 1.2 x 1.2 x 1.1 cm. Left Kidney: Length: 10.4 cm.  Diffusely increased echogenicity. Bladder: Appears normal for degree of bladder distention. IMPRESSION: Markedly increased echogenicity of the bilateral kidneys, consistent with intrinsic renal disease. Electronically Signed   By: Fidela Salisbury M.D.   On: 06/25/2016 20:19    Time Spent in minutes  30   Lala Lund M.D on 06/26/2016 at 11:57 AM  Between 7am to 7pm - Pager - [phone] ( page via [email or web address], text pages only, please mention full 10 digit call back number). After 7pm go to [email or web address] - password [facility]

## 2016-06-26 NOTE — Progress Notes (Signed)
Subjective: Interval History: has no complaint. Discussed bx , fistula/cath, current status.  Objective: Vital signs in last 24 hours: Temp:  [97.7 F (36.5 C)-98.6 F (37 C)] 98.6 F (37 C) (06/16 0700) Pulse Rate:  [59-66] 59 (06/16 0700) Resp:  [16-18] 18 (06/16 0700) BP: (126-158)/(65-68) 126/65 (06/16 0700) SpO2:  [97 %-99 %] 98 % (06/16 0700) Weight:  [83.3 kg (183 lb 10.3 oz)-83.3 kg (183 lb 11.2 oz)] 83.3 kg (183 lb 10.3 oz) (06/15 2139) Weight change:   Intake/Output from previous day: 06/15 0701 - 06/16 0700 In: 597 [P.O.:480; IV Piggyback:117] Out: 1100 [Urine:1100] Intake/Output this shift: No intake/output data recorded.  General appearance: alert, cooperative, no distress and mildly obese Resp: clear to auscultation bilaterally Cardio: S1, S2 normal and systolic murmur: holosystolic 2/6, blowing at apex GI: soft, non-tender; bowel sounds normal; no masses,  no organomegaly Extremities: edema Tr  Lab Results:  Recent Labs  06/25/16 1458 06/26/16 0431  WBC 3.2* 3.8*  HGB 8.5* 8.4*  HCT 26.0* 25.9*  PLT 203 212   BMET:  Recent Labs  06/25/16 1458 06/26/16 0431  NA 141 139  K 4.7 4.1  CL 114* 111  CO2 18* 18*  GLUCOSE 104* 90  BUN 88* 90*  CREATININE 8.33* 8.71*  CALCIUM 8.9 8.8*    Recent Labs  06/25/16 1458  PTH 422*   Iron Studies:  Recent Labs  06/25/16 1458  IRON 59  TIBC 295    Studies/Results: US Renal  Result Date: 06/25/2016 CLINICAL DATA:  Acute renal failure. EXAM: RENAL / URINARY TRACT ULTRASOUND COMPLETE COMPARISON:  None. FINDINGS: Right Kidney: Length: 10.9 cm. Diffusely increased echogenicity of the cortex. Mildly complicated cyst is seen in the superior pole of the right kidney measuring 1.2 x 1.2 x 1.1 cm. Left Kidney: Length: 10.4 cm.  Diffusely increased echogenicity. Bladder: Appears normal for degree of bladder distention. IMPRESSION: Markedly increased echogenicity of the bilateral kidneys, consistent with  intrinsic renal disease. Electronically Signed   By: Fidela Salisbury M.D.   On: 06/25/2016 20:19    I have reviewed the patient's current medications.  Assessment/Plan: 1 CKD5  Suspect this is chronic dz.  Will do bx, discussed risk with scarring present. Looking for any reversibility .  Needs to get access PC and perm. 2 Anemia esa/Fe 3 HPTH 4 Scleroderma 5 HTN controlled P bx, PC, access , educate.    LOS: 1 day   Katie Nunez L 06/26/2016,9:50 AM

## 2016-06-26 NOTE — Progress Notes (Signed)
Upper Extremity Vein Map    Right Cephalic  Segment Diameter Depth Comment  1. Axilla 3.57mm 12.58mm   2. Mid upper arm 3.62mm 8.40mm   3. Above [facility] 3.81mm 5.1mm   4. In [facility] 3.84mm 3.61mm branch  5. Below AC 2.27mm 5.40mm   6. Mid forearm 2.50mm 3.85mm   7. Wrist 2.108mm mm    mm mm    mm mm    mm mm    Right Basilic  Segment Diameter Depth Comment   mm mm    mm mm   3. Above [facility] 3.52mm 24.32mm   4. In [facility] 3.29mm 19.68mm   5. Below AC 2.74mm 11.61mm branch   mm mm    mm mm    mm mm    mm mm    mm mm     Left Cephalic  Segment Diameter Depth Comment  1. Axilla 3.42mm 12.13mm   2. Mid upper arm 3.71mm 7.66mm branch  3. Above AC 2.69mm 6.53mm   4. In [facility] 5.18mm mm   5. Below AC 3.11mm mm branch  6. Mid forearm 2.9mm 10mm   7. Wrist 1.24mm 4.58mm    mm mm    mm mm    mm mm    Left Basilic  Segment Diameter Depth Comment   mm mm    mm mm   3. Above [facility] 4.66mm 30.27mm   4. In [facility] 3.52mm 36mm   5. Below AC 3.60mm 15.53mm    mm mm    mm mm    mm mm    mm mm    mm mm    Landry Mellow, RDMS, RVT 06/26/2016

## 2016-06-27 ENCOUNTER — Encounter (HOSPITAL_COMMUNITY): Payer: Self-pay

## 2016-06-27 LAB — RENAL FUNCTION PANEL
Albumin: 2.8 g/dL — ABNORMAL LOW (ref 3.5–5.0)
Anion gap: 8 (ref 5–15)
BUN: 90 mg/dL — AB (ref 6–20)
CO2: 18 mmol/L — ABNORMAL LOW (ref 22–32)
CREATININE: 9.04 mg/dL — AB (ref 0.44–1.00)
Calcium: 9.3 mg/dL (ref 8.9–10.3)
Chloride: 117 mmol/L — ABNORMAL HIGH (ref 101–111)
GFR calc Af Amer: 5 mL/min — ABNORMAL LOW (ref >60)
GFR, EST NON AFRICAN AMERICAN: 4 mL/min — AB (ref >60)
Glucose, Bld: 96 mg/dL (ref 65–99)
PHOSPHORUS: 5.9 mg/dL — AB (ref 2.5–4.6)
Potassium: 4.4 mmol/L (ref 3.5–5.1)
Sodium: 143 mmol/L (ref 135–145)

## 2016-06-27 MED ORDER — RENA-VITE PO TABS
1.0000 | ORAL_TABLET | Freq: Every day | ORAL | Status: DC
  Administered 2016-06-27 – 2016-06-28 (×2): 1 via ORAL
  Filled 2016-06-27 (×2): qty 1

## 2016-06-27 MED ORDER — SODIUM CHLORIDE 0.9 % IV SOLN
510.0000 mg | Freq: Once | INTRAVENOUS | Status: AC
  Administered 2016-06-27: 510 mg via INTRAVENOUS
  Filled 2016-06-27: qty 17

## 2016-06-27 MED ORDER — FLUTICASONE PROPIONATE 50 MCG/ACT NA SUSP
1.0000 | Freq: Two times a day (BID) | NASAL | Status: DC
  Filled 2016-06-27: qty 16

## 2016-06-27 MED ORDER — CALCITRIOL 0.5 MCG PO CAPS
0.5000 ug | ORAL_CAPSULE | Freq: Every day | ORAL | Status: DC
  Administered 2016-06-27 – 2016-07-07 (×11): 0.5 ug via ORAL
  Filled 2016-06-27 (×12): qty 1

## 2016-06-27 MED ORDER — METOPROLOL SUCCINATE ER 50 MG PO TB24
50.0000 mg | ORAL_TABLET | Freq: Every day | ORAL | Status: DC
  Administered 2016-06-27 – 2016-07-06 (×8): 50 mg via ORAL
  Filled 2016-06-27 (×8): qty 1

## 2016-06-27 MED ORDER — ALUM & MAG HYDROXIDE-SIMETH 200-200-20 MG/5ML PO SUSP
30.0000 mL | Freq: Two times a day (BID) | ORAL | Status: DC | PRN
  Administered 2016-06-27 – 2016-07-06 (×4): 30 mL via ORAL
  Filled 2016-06-27 (×4): qty 30

## 2016-06-27 NOTE — Progress Notes (Signed)
Chief Complaint: Patient was seen in consultation today for renal biopsy at the request of Dr. Jeneen Rinks Nunez  Referring Physician(s): Dr. Jeneen Rinks Nunez  Supervising Physician: Katie Nunez  [facility]  History of Present Illness: Katie Nunez is a 61 y.o. female with hx of scleroderma/RA now with progressive CKD5/AKI. She has been admitted and is going to have HD access by VVS. IR is asked to do medical renal biopsy. Chart, PMHx, meds, labs, imaging reviewed. Pt sitting up in chair, offers no complaint this am.   Past Medical History:  Diagnosis Date  . Achalasia   . Allergy   . Dysplasia of cervix, low grade (CIN 1)   . Fibromyalgia   . Gout   . Hypertension   . IBS (irritable bowel syndrome)   . MVP (mitral valve prolapse)   . RA (rheumatoid arthritis) (HCC)    FOLLOWED BY DR. SHANAHAN  . Raynaud's disease   . Scleroderma ([place])   . Seasonal allergies   . Tubular adenoma 01/08/2008   CECUM  . Vitamin D deficiency     Past Surgical History:  Procedure Laterality Date  . ANKLE SURGERY    . CESAREAN SECTION    . CO2 LASER OF CERVIX    . COLONOSCOPY W/ BIOPSIES  01/08/2008  . MYOMECTOMY    . PELVIC LAPAROSCOPY  2011  . superficial thrombophlebitis Left 07-2014    Allergies: Savella [milnacipran hcl]  Medications:  Current Facility-Administered Medications:  .  acetaminophen (TYLENOL) tablet 650 mg, 650 mg, Oral, Q6H PRN **OR** [DISCONTINUED] acetaminophen (TYLENOL) suppository 650 mg, 650 mg, Rectal, Q6H PRN, Tamala Julian, Rondell A, MD .  calcitRIOL (ROCALTROL) capsule 0.5 mcg, 0.5 mcg, Oral, Daily, Nunez, James, MD, 0.5 mcg at 06/27/16 0945 .  clonazePAM (KLONOPIN) tablet 0.5 mg, 0.5 mg, Oral, BID PRN, Tamala Julian, Rondell A, MD .  Darbepoetin Alfa (ARANESP) injection 200 mcg, 200 mcg, Subcutaneous, Q Sat-1800, Nunez, James, MD, 200 mcg at 06/26/16 1911 .  ferumoxytol (FERAHEME) 510 mg in sodium chloride 0.9 % 100 mL IVPB, 510 mg,  Intravenous, Once, Nunez, James, MD .  hydrALAZINE (APRESOLINE) injection 10 mg, 10 mg, Intravenous, Q4H PRN, [name], Rondell A, MD .  ipratropium-albuterol (DUONEB) 0.5-2.5 (3) MG/3ML nebulizer solution 3 mL, 3 mL, Nebulization, Q6H PRN, [name], Rondell A, MD .  loratadine (CLARITIN) tablet 10 mg, 10 mg, Oral, Daily, [name], Rondell A, MD, 10 mg at 06/27/16 0945 .  metoprolol succinate (TOPROL-XL) 24 hr tablet 50 mg, 50 mg, Oral, QHS, Nunez, James, MD .  multivitamin (RENA-VIT) tablet 1 tablet, 1 tablet, Oral, QHS, Nunez, James, MD .  [DISCONTINUED] ondansetron (ZOFRAN) tablet 4 mg, 4 mg, Oral, Q6H PRN **OR** ondansetron (ZOFRAN) injection 4 mg, 4 mg, Intravenous, Q6H PRN, Norval Morton, MD    Family History  Problem Relation Age of Onset  . Hypertension Mother   . Diabetes Mother   . Heart disease Father   . Hypertension Maternal Aunt   . Diabetes Maternal Grandmother   . Heart disease Paternal Grandfather   . Cerebral palsy Cousin        1ST COUSIN?  . Diabetes Paternal Grandmother     Social History   Social History  . Marital status: Married    Spouse name: N/A  . Number of children: N/A  . Years of education: N/A   Social History Main Topics  . Smoking status: Never Smoker  . Smokeless tobacco: Never Used  . Alcohol use No  .  Drug use: No  . Sexual activity: Yes    Birth control/ protection: Post-menopausal   Other Topics Concern  . Not on file   Social History Narrative  . No narrative on file    Review of Systems: A 12 point ROS discussed and pertinent positives are indicated in the HPI above.  All other systems are negative.  Review of Systems  Vital Signs: BP 129/64 (BP Location: Left Arm)   Pulse 67   Temp 98.5 F (36.9 C) (Oral)   Resp 18   Ht 5\' 5"  (1.651 m)   Wt 186 lb 1.1 oz (84.4 kg)   LMP 11/15/2008   SpO2 96%   BMI 30.96 kg/m   Physical Exam  Constitutional: She is oriented to person, place, and time. She appears  well-developed and well-nourished. No distress.  HENT:  Head: Normocephalic.  Mouth/Throat: Oropharynx is clear and moist.  Neck: Normal range of motion. No JVD present. No tracheal deviation present.  Cardiovascular: Normal rate, regular rhythm and normal heart sounds.   Pulmonary/Chest: Effort normal and breath sounds normal. No respiratory distress.  Abdominal: Soft. She exhibits no mass. There is no tenderness.  Neurological: She is alert and oriented to person, place, and time.  Skin: Skin is warm and dry.  Psychiatric: She has a normal mood and affect. Judgment normal.     Mallampati Score:  MD Evaluation Airway: WNL Heart: WNL Abdomen: WNL Chest/ Lungs: WNL ASA  Classification: 3 Mallampati/Airway Score: One  Imaging: US Renal  Result Date: 06/25/2016 CLINICAL DATA:  Acute renal failure. EXAM: RENAL / URINARY TRACT ULTRASOUND COMPLETE COMPARISON:  None. FINDINGS: Right Kidney: Length: 10.9 cm. Diffusely increased echogenicity of the cortex. Mildly complicated cyst is seen in the superior pole of the right kidney measuring 1.2 x 1.2 x 1.1 cm. Left Kidney: Length: 10.4 cm.  Diffusely increased echogenicity. Bladder: Appears normal for degree of bladder distention. IMPRESSION: Markedly increased echogenicity of the bilateral kidneys, consistent with intrinsic renal disease. Electronically Signed   By: Fidela Salisbury M.D.   On: 06/25/2016 20:19    Labs:  CBC:  Recent Labs  06/25/16 1458 06/26/16 0431  WBC 3.2* 3.8*  HGB 8.5* 8.4*  HCT 26.0* 25.9*  PLT 203 212    COAGS:  Recent Labs  06/26/16 1207  INR 1.08  APTT 29    BMP:  Recent Labs  06/25/16 1458 06/26/16 0431 06/27/16 0908  NA 141 139 143  K 4.7 4.1 4.4  CL 114* 111 117*  CO2 18* 18* 18*  GLUCOSE 104* 90 96  BUN 88* 90* 90*  CALCIUM 8.9 8.8* 9.3  CREATININE 8.33* 8.71* 9.04*  GFRNONAA 5* 4* 4*  GFRAA 5* 5* 5*    LIVER FUNCTION TESTS:  Recent Labs  06/25/16 1458 06/26/16 0431  06/27/16 0908  BILITOT 0.3  --   --   AST 15  --   --   ALT 11*  --   --   ALKPHOS 48  --   --   PROT 7.1  --   --   ALBUMIN 2.9* 2.9* 2.8*    TUMOR MARKERS: No results for input(s): AFPTM, CEA, CA199, CHROMGRNA in the last 8760 hours.  Assessment and Plan: CKD5/AKI For US guided random renal biopsy Labs reviewed. May have to coordinate with [facility] OR plans for cath/AVF so biopsy might not be able to be done until Tues. Risks and Benefits discussed with the patient including, but not limited to bleeding, infection, damage to  adjacent structures or low yield requiring additional tests. All of the patient's questions were answered, patient is agreeable to proceed. Consent signed and in chart.    Thank you for this interesting consult.  I greatly enjoyed meeting Katie Nunez and look forward to participating in their care.  A copy of this report was sent to the requesting provider on this date.  Electronically Signed: Ascencion Dike, PA-C 06/27/2016, 11:21 AM   I spent a total of 20 minutes in face to face in clinical consultation, greater than 50% of which was counseling/coordinating care for renal biopsy

## 2016-06-27 NOTE — Progress Notes (Signed)
Subjective: Interval History: has no complaint..  Objective: Vital signs in last 24 hours: Temp:  [97.7 F (36.5 C)-98.9 F (37.2 C)] 98.2 F (36.8 C) (06/17 0626) Pulse Rate:  [59-67] 67 (06/17 0626) Resp:  [18] 18 (06/17 0626) BP: (129-146)/(63-74) 129/63 (06/17 0626) SpO2:  [96 %-99 %] 96 % (06/17 0626) Weight:  [84.4 kg (186 lb 1.1 oz)] 84.4 kg (186 lb 1.1 oz) (06/16 2152) Weight change: 1.074 kg (2 lb 5.9 oz)  Intake/Output from previous day: 06/16 0701 - 06/17 0700 In: 960 [P.O.:960] Out: 2100 [Urine:2100] Intake/Output this shift: No intake/output data recorded.  General appearance: alert, cooperative and no distress Resp: clear to auscultation bilaterally Cardio: S1, S2 normal and systolic murmur: systolic ejection 2/6, decrescendo at 2nd left intercostal space GI: soft, non-tender; bowel sounds normal; no masses,  no organomegaly Extremities: extremities normal, atraumatic, no cyanosis or edema  Lab Results:  Recent Labs  06/25/16 1458 06/26/16 0431  WBC 3.2* 3.8*  HGB 8.5* 8.4*  HCT 26.0* 25.9*  PLT 203 212   BMET:  Recent Labs  06/25/16 1458 06/26/16 0431  NA 141 139  K 4.7 4.1  CL 114* 111  CO2 18* 18*  GLUCOSE 104* 90  BUN 88* 90*  CREATININE 8.33* 8.71*  CALCIUM 8.9 8.8*    Recent Labs  06/25/16 1458  PTH 422*   Iron Studies:  Recent Labs  06/25/16 1458  IRON 59  TIBC 295    Studies/Results: US Renal  Result Date: 06/25/2016 CLINICAL DATA:  Acute renal failure. EXAM: RENAL / URINARY TRACT ULTRASOUND COMPLETE COMPARISON:  None. FINDINGS: Right Kidney: Length: 10.9 cm. Diffusely increased echogenicity of the cortex. Mildly complicated cyst is seen in the superior pole of the right kidney measuring 1.2 x 1.2 x 1.1 cm. Left Kidney: Length: 10.4 cm.  Diffusely increased echogenicity. Bladder: Appears normal for degree of bladder distention. IMPRESSION: Markedly increased echogenicity of the bilateral kidneys, consistent with intrinsic  renal disease. Electronically Signed   By: Fidela Salisbury M.D.   On: 06/25/2016 20:19    I have reviewed the patient's current medications.  Assessment/Plan: 1 CKD5/AKI  For access and bx.  Doubt reversible but will hopefully answer some question.  Course c/w CKD and afraid too late for reversibiliy. 2 HTN improved 3 Anemia esa, Give Fe 4 PTH vit D P Bx, access, Fe, esa, vit D   LOS: 2 days   Jonerik Sliker L 06/27/2016,9:26 AM

## 2016-06-27 NOTE — Progress Notes (Signed)
@IPLOG         PROGRESS NOTE                                                                                                                                                                                                             Patient Demographics:    Katie Nunez, is a 61 y.o. female, DOB - 1955-11-07, IHK:[id number]  Admit date - 06/25/2016   Admitting Physician Katie Morton, MD  Outpatient Primary MD for the patient is Katie Ada, MD  LOS - 2  No chief complaint on file.      Brief Narrative  Katie Nunez is a 61 y.o. female with medical history significant of scleroderma, rheumatoid arthritis, fibromyalgia, and IBS, who presents after being instructed by her nephrologist of recent abnormal lab work, she had recently seen Dr. Moshe Nunez nephrologist for rising creatinine levels, her creatinine last in May 2018 was 4.7, came to the ER after her creatinine was found to be close to 8 by PCP.   Subjective:   Patient in bed, appears comfortable, denies any headache, no fever, no chest pain or pressure, no shortness of breath , no abdominal pain. No focal weakness.   Assessment  & Plan :      1.Likely CK D5/ESRD. Nephrology on board, due for renal biopsy on 06/28/2016, continue to hold nephrotoxins, renal ultrasound shows intrinsic kidney disease, per nephrology patient likely will go on dialysis this admission. Vascular surgery consulted for access placement. Currently no uremic symptoms.  2. History of rheumatoid arthritis and scleroderma. This is not scleroderma renal crisis per nephrology, patient follows with Dr. Boris Nunez rheumatologist at [facility] will follow with them post discharge.  3. Hypertension. Stable on Bystolic for now continue to monitor.  4. Anemia of chronic disease. Stable we'll continue to monitor.    Diet : Diet renal with fluid restriction Fluid restriction: 1200 mL Fluid; Room service appropriate? Yes; Fluid consistency: Thin Diet NPO time  specified Except for: Sips with Meds    Family Communication  :  None  Code Status :  Full  Disposition Plan  :  TBD  Consults  :  Renal, VVS, IR  Procedures  :    Renal ultrasound. Consistent with intrinsic renal disease  Renal biopsy    DVT Prophylaxis  :   SCD  Lab Results  Component Value Date   PLT 212 06/26/2016    Inpatient Medications  Scheduled Meds: . calcitRIOL  0.5 mcg Oral Daily  . darbepoetin (ARANESP) injection - NON-DIALYSIS  200 mcg Subcutaneous Q Sat-1800  . loratadine  10 mg Oral Daily  . metoprolol succinate  50 mg Oral QHS  . multivitamin  1 tablet Oral QHS   Continuous Infusions: . ferumoxytol     PRN Meds:.acetaminophen **OR** [DISCONTINUED] acetaminophen, clonazePAM, hydrALAZINE, ipratropium-albuterol, [DISCONTINUED] ondansetron **OR** ondansetron (ZOFRAN) IV  Antibiotics  :    Anti-infectives    None         Objective:   Vitals:   06/26/16 1853 06/26/16 2152 06/27/16 0626 06/27/16 0940  BP: 135/74 (!) 146/70 129/63 129/64  Pulse: 64 (!) 59 67 67  Resp: 18 18 18 18   Temp: 98.9 F (37.2 C) 97.7 F (36.5 C) 98.2 F (36.8 C) 98.5 F (36.9 C)  TempSrc: Oral Oral Oral Oral  SpO2: 99% 98% 96% 96%  Weight:  84.4 kg (186 lb 1.1 oz)    Height:        Wt Readings from Last 3 Encounters:  06/26/16 84.4 kg (186 lb 1.1 oz)  01/27/16 84.8 kg (187 lb)  11/06/15 87.2 kg (192 lb 4.8 oz)     Intake/Output Summary (Last 24 hours) at 06/27/16 1113 Last data filed at 06/27/16 0945  Gross per 24 hour  Intake             1080 ml  Output             2100 ml  Net            -1020 ml     Physical Exam Awake Alert, Oriented X 3, No new F.N deficits, Normal affect [place].AT,PERRAL Supple Neck,No JVD, No cervical lymphadenopathy appriciated.  Symmetrical Chest wall movement, Good air movement bilaterally, CTAB RRR,No Gallops,Rubs or new Murmurs, No Parasternal Heave +ve B.Sounds, Abd Soft, No tenderness, No organomegaly appriciated, No  rebound - guarding or rigidity. No Cyanosis, Clubbing or edema, No new Rash or bruise    Data Review:    CBC  Recent Labs Lab 06/25/16 1458 06/26/16 0431  WBC 3.2* 3.8*  HGB 8.5* 8.4*  HCT 26.0* 25.9*  PLT 203 212  MCV 85.2 85.2  MCH 27.9 27.6  MCHC 32.7 32.4  RDW 14.2 14.1  LYMPHSABS 0.7  --   MONOABS 0.2  --   EOSABS 0.0  --   BASOSABS 0.0  --     Chemistries   Recent Labs Lab 06/25/16 1458 06/26/16 0431 06/27/16 0908  NA 141 139 143  K 4.7 4.1 4.4  CL 114* 111 117*  CO2 18* 18* 18*  GLUCOSE 104* 90 96  BUN 88* 90* 90*  CREATININE 8.33* 8.71* 9.04*  CALCIUM 8.9 8.8* 9.3  AST 15  --   --   ALT 11*  --   --   ALKPHOS 48  --   --   BILITOT 0.3  --   --    ------------------------------------------------------------------------------------------------------------------ No results for input(s): CHOL, HDL, LDLCALC, TRIG, CHOLHDL, LDLDIRECT in the last 72 hours.  No results found for: HGBA1C ------------------------------------------------------------------------------------------------------------------ No results for input(s): TSH, T4TOTAL, T3FREE, THYROIDAB in the last 72 hours.  Invalid input(s): FREET3 ------------------------------------------------------------------------------------------------------------------  Recent Labs  06/25/16 1458  TIBC 295  IRON 59    Coagulation profile  Recent Labs Lab 06/26/16 1207  INR 1.08    No results for input(s): DDIMER in the last 72 hours.  Cardiac Enzymes No results for input(s): CKMB, TROPONINI, MYOGLOBIN in the last 168 hours.  Invalid input(s): CK ------------------------------------------------------------------------------------------------------------------ No results found for: BNP  Micro Results No  results found for this or any previous visit (from the past 240 hour(s)).  Radiology Reports US Renal  Result Date: 06/25/2016 CLINICAL DATA:  Acute renal failure. EXAM: RENAL / URINARY  TRACT ULTRASOUND COMPLETE COMPARISON:  None. FINDINGS: Right Kidney: Length: 10.9 cm. Diffusely increased echogenicity of the cortex. Mildly complicated cyst is seen in the superior pole of the right kidney measuring 1.2 x 1.2 x 1.1 cm. Left Kidney: Length: 10.4 cm.  Diffusely increased echogenicity. Bladder: Appears normal for degree of bladder distention. IMPRESSION: Markedly increased echogenicity of the bilateral kidneys, consistent with intrinsic renal disease. Electronically Signed   By: Fidela Salisbury M.D.   On: 06/25/2016 20:19    Time Spent in minutes  30   Lala Lund M.D on 06/27/2016 at 11:13 AM  Between 7am to 7pm - Pager - [phone] ( page via [email or web address], text pages only, please mention full 10 digit call back number). After 7pm go to [email or web address] - password [facility]

## 2016-06-27 NOTE — Progress Notes (Signed)
Patient is complaining of cramping, gas, bloating after eating. No current PRN orders. Also, patient is requesting nose spray for allergies.  MD notified. Verbal order to give Maalox 30cc q12 PRN and Flonase 1 spray each nare bid. Orders placed and followed.Will continue to monitor.

## 2016-06-28 ENCOUNTER — Inpatient Hospital Stay (HOSPITAL_COMMUNITY): Payer: BLUE CROSS/BLUE SHIELD

## 2016-06-28 ENCOUNTER — Inpatient Hospital Stay (HOSPITAL_COMMUNITY): Payer: BLUE CROSS/BLUE SHIELD | Admitting: Certified Registered Nurse Anesthetist

## 2016-06-28 ENCOUNTER — Encounter (HOSPITAL_COMMUNITY)
Admission: AD | Disposition: A | Discharge status: Home / Self Care | Payer: Self-pay | Source: Ambulatory Visit | Attending: Internal Medicine

## 2016-06-28 DIAGNOSIS — K219 Gastro-esophageal reflux disease without esophagitis: Secondary | ICD-10-CM

## 2016-06-28 HISTORY — PX: INSERTION OF DIALYSIS CATHETER: SHX1324

## 2016-06-28 HISTORY — PX: AV FISTULA PLACEMENT: SHX1204

## 2016-06-28 LAB — CBC
HCT: 29.1 % — ABNORMAL LOW (ref 36.0–46.0)
Hemoglobin: 9.4 g/dL — ABNORMAL LOW (ref 12.0–15.0)
MCH: 27.8 pg (ref 26.0–34.0)
MCHC: 32.3 g/dL (ref 30.0–36.0)
MCV: 86.1 fL (ref 78.0–100.0)
PLATELETS: 210 10*3/uL (ref 150–400)
RBC: 3.38 MIL/uL — AB (ref 3.87–5.11)
RDW: 14.3 % (ref 11.5–15.5)
WBC: 6.6 10*3/uL (ref 4.0–10.5)

## 2016-06-28 LAB — RENAL FUNCTION PANEL
Albumin: 2.9 g/dL — ABNORMAL LOW (ref 3.5–5.0)
Albumin: 3 g/dL — ABNORMAL LOW (ref 3.5–5.0)
Anion gap: 9 (ref 5–15)
Anion gap: 9 (ref 5–15)
BUN: 92 mg/dL — AB (ref 6–20)
BUN: 86 mg/dL — AB (ref 6–20)
CALCIUM: 9.3 mg/dL (ref 8.9–10.3)
CALCIUM: 9.3 mg/dL (ref 8.9–10.3)
CHLORIDE: 113 mmol/L — AB (ref 101–111)
CO2: 16 mmol/L — AB (ref 22–32)
CO2: 18 mmol/L — AB (ref 22–32)
CREATININE: 8.62 mg/dL — AB (ref 0.44–1.00)
Chloride: 113 mmol/L — ABNORMAL HIGH (ref 101–111)
Creatinine, Ser: 9.04 mg/dL — ABNORMAL HIGH (ref 0.44–1.00)
GFR calc Af Amer: 5 mL/min — ABNORMAL LOW (ref >60)
GFR calc non Af Amer: 4 mL/min — ABNORMAL LOW (ref >60)
GFR calc non Af Amer: 5 mL/min — ABNORMAL LOW (ref >60)
GFR, EST AFRICAN AMERICAN: 5 mL/min — AB (ref >60)
GLUCOSE: 90 mg/dL (ref 65–99)
GLUCOSE: 94 mg/dL (ref 65–99)
PHOSPHORUS: 4.9 mg/dL — AB (ref 2.5–4.6)
POTASSIUM: 4.3 mmol/L (ref 3.5–5.1)
Phosphorus: 5.6 mg/dL — ABNORMAL HIGH (ref 2.5–4.6)
Potassium: 4.6 mmol/L (ref 3.5–5.1)
SODIUM: 138 mmol/L (ref 135–145)
SODIUM: 140 mmol/L (ref 135–145)

## 2016-06-28 LAB — SURGICAL PCR SCREEN
MRSA, PCR: NEGATIVE
Staphylococcus aureus: NEGATIVE

## 2016-06-28 LAB — TROPONIN I

## 2016-06-28 SURGERY — INSERTION OF DIALYSIS CATHETER
Anesthesia: Monitor Anesthesia Care | Site: Neck | Laterality: Right

## 2016-06-28 MED ORDER — MIDAZOLAM HCL 2 MG/2ML IJ SOLN
INTRAMUSCULAR | Status: AC
  Filled 2016-06-28: qty 2

## 2016-06-28 MED ORDER — MIDAZOLAM HCL 2 MG/2ML IJ SOLN
INTRAMUSCULAR | Status: DC | PRN
  Administered 2016-06-28: 2 mg via INTRAVENOUS

## 2016-06-28 MED ORDER — BISACODYL 10 MG RE SUPP
10.0000 mg | Freq: Every day | RECTAL | Status: DC
  Filled 2016-06-28 (×6): qty 1

## 2016-06-28 MED ORDER — FENTANYL CITRATE (PF) 100 MCG/2ML IJ SOLN
INTRAMUSCULAR | Status: DC | PRN
  Administered 2016-06-28: 25 ug via INTRAVENOUS
  Administered 2016-06-28: 50 ug via INTRAVENOUS
  Administered 2016-06-28 (×2): 25 ug via INTRAVENOUS

## 2016-06-28 MED ORDER — ONDANSETRON HCL 4 MG/2ML IJ SOLN: 4.0000 mg | Freq: Once | INTRAMUSCULAR | Status: DC | PRN

## 2016-06-28 MED ORDER — HEPARIN SODIUM (PORCINE) 1000 UNIT/ML IJ SOLN
INTRAMUSCULAR | Status: DC | PRN
Start: 2016-06-28 — End: 2016-06-28
  Administered 2016-06-28: 3.4 [IU]

## 2016-06-28 MED ORDER — LIDOCAINE-PRILOCAINE 2.5-2.5 % EX CREA: 1.0000 "application " | TOPICAL_CREAM | CUTANEOUS | Status: DC | PRN

## 2016-06-28 MED ORDER — PANTOPRAZOLE SODIUM 40 MG PO TBEC
40.0000 mg | DELAYED_RELEASE_TABLET | Freq: Every day | ORAL | Status: DC
  Administered 2016-06-28 – 2016-07-01 (×3): 40 mg via ORAL
  Filled 2016-06-28 (×8): qty 1

## 2016-06-28 MED ORDER — SODIUM CHLORIDE 0.9 % IV SOLN
INTRAVENOUS | Status: DC | PRN
  Administered 2016-06-28: 12:00:00 500 mL

## 2016-06-28 MED ORDER — LIDOCAINE HCL (PF) 1 % IJ SOLN: 5.0000 mL | INTRAMUSCULAR | Status: DC | PRN

## 2016-06-28 MED ORDER — CEFAZOLIN SODIUM-DEXTROSE 2-3 GM-% IV SOLR
INTRAVENOUS | Status: DC | PRN
  Administered 2016-06-28: 2 g via INTRAVENOUS

## 2016-06-28 MED ORDER — FENTANYL CITRATE (PF) 100 MCG/2ML IJ SOLN: 25.0000 ug | INTRAMUSCULAR | Status: DC | PRN

## 2016-06-28 MED ORDER — LIDOCAINE-EPINEPHRINE (PF) 1 %-1:200000 IJ SOLN
INTRAMUSCULAR | Status: AC
  Filled 2016-06-28: qty 30

## 2016-06-28 MED ORDER — PENTAFLUOROPROP-TETRAFLUOROETH EX AERO: 1.0000 "application " | INHALATION_SPRAY | CUTANEOUS | Status: DC | PRN

## 2016-06-28 MED ORDER — OXYCODONE HCL 5 MG/5ML PO SOLN: 5.0000 mg | Freq: Once | ORAL | Status: DC | PRN

## 2016-06-28 MED ORDER — SODIUM CHLORIDE 0.9 % IV SOLN: 100.0000 mL | INTRAVENOUS | Status: DC | PRN

## 2016-06-28 MED ORDER — HEPARIN SODIUM (PORCINE) 1000 UNIT/ML DIALYSIS: 1000.0000 [IU] | INTRAMUSCULAR | Status: DC | PRN

## 2016-06-28 MED ORDER — ALTEPLASE 2 MG IJ SOLR
2.0000 mg | Freq: Once | INTRAMUSCULAR | Status: AC | PRN
  Administered 2016-06-30: 2 mg

## 2016-06-28 MED ORDER — CEFAZOLIN SODIUM-DEXTROSE 2-4 GM/100ML-% IV SOLN
INTRAVENOUS | Status: AC
  Filled 2016-06-28: qty 100

## 2016-06-28 MED ORDER — FAMOTIDINE 20 MG PO TABS
40.0000 mg | ORAL_TABLET | Freq: Every day | ORAL | Status: DC
  Administered 2016-07-01: 40 mg via ORAL
  Filled 2016-06-28 (×3): qty 2

## 2016-06-28 MED ORDER — FENTANYL CITRATE (PF) 250 MCG/5ML IJ SOLN
INTRAMUSCULAR | Status: AC
  Filled 2016-06-28: qty 5

## 2016-06-28 MED ORDER — SODIUM CHLORIDE 0.9 % IV SOLN
INTRAVENOUS | Status: DC
  Administered 2016-06-28 (×2): via INTRAVENOUS

## 2016-06-28 MED ORDER — OXYCODONE HCL 5 MG PO TABS: 5.0000 mg | ORAL_TABLET | Freq: Once | ORAL | Status: DC | PRN

## 2016-06-28 MED ORDER — PROPOFOL 10 MG/ML IV BOLUS
INTRAVENOUS | Status: DC | PRN
  Administered 2016-06-28 (×2): 20 mg via INTRAVENOUS

## 2016-06-28 MED ORDER — DOCUSATE SODIUM 100 MG PO CAPS
200.0000 mg | ORAL_CAPSULE | Freq: Two times a day (BID) | ORAL | Status: DC
  Administered 2016-06-28 – 2016-07-07 (×7): 200 mg via ORAL
  Filled 2016-06-28 (×15): qty 2

## 2016-06-28 MED ORDER — PROPOFOL 500 MG/50ML IV EMUL
INTRAVENOUS | Status: DC | PRN
  Administered 2016-06-28: 75 ug/kg/min via INTRAVENOUS

## 2016-06-28 MED ORDER — LIDOCAINE-EPINEPHRINE (PF) 1 %-1:200000 IJ SOLN
INTRAMUSCULAR | Status: DC | PRN
  Administered 2016-06-28: 15 mL via INTRADERMAL

## 2016-06-28 MED ORDER — 0.9 % SODIUM CHLORIDE (POUR BTL) OPTIME
TOPICAL | Status: DC | PRN
  Administered 2016-06-28: 1000 mL

## 2016-06-28 MED ORDER — FAMOTIDINE IN NACL 20-0.9 MG/50ML-% IV SOLN
20.0000 mg | Freq: Once | INTRAVENOUS | Status: AC
  Administered 2016-06-28: 20 mg via INTRAVENOUS
  Filled 2016-06-28: qty 50

## 2016-06-28 MED ORDER — HEPARIN SODIUM (PORCINE) 1000 UNIT/ML IJ SOLN
INTRAMUSCULAR | Status: AC
  Filled 2016-06-28: qty 1

## 2016-06-28 MED ORDER — PHENYLEPHRINE HCL 10 MG/ML IJ SOLN
INTRAMUSCULAR | Status: DC | PRN
  Administered 2016-06-28: 30 ug/min via INTRAVENOUS

## 2016-06-28 SURGICAL SUPPLY — 53 items
ARMBAND PINK RESTRICT EXTREMIT (MISCELLANEOUS) ×8 IMPLANT
BAG DECANTER FOR FLEXI CONT (MISCELLANEOUS) ×4 IMPLANT
BIOPATCH RED 1 DISK 7.0 (GAUZE/BANDAGES/DRESSINGS) ×3 IMPLANT
BIOPATCH RED 1IN DISK 7.0MM (GAUZE/BANDAGES/DRESSINGS) ×1
CANISTER SUCT 3000ML PPV (MISCELLANEOUS) ×4 IMPLANT
CANNULA VESSEL 3MM 2 BLNT TIP (CANNULA) ×4 IMPLANT
CATH PALINDROME RT-P 15FX19CM (CATHETERS) IMPLANT
CATH PALINDROME RT-P 15FX23CM (CATHETERS) ×4 IMPLANT
CATH PALINDROME RT-P 15FX28CM (CATHETERS) IMPLANT
CATH PALINDROME RT-P 15FX55CM (CATHETERS) IMPLANT
CLIP LIGATING EXTRA MED SLVR (CLIP) ×4 IMPLANT
CLIP LIGATING EXTRA SM BLUE (MISCELLANEOUS) ×4 IMPLANT
COVER PROBE W GEL 5X96 (DRAPES) ×4 IMPLANT
COVER SURGICAL LIGHT HANDLE (MISCELLANEOUS) ×4 IMPLANT
DECANTER SPIKE VIAL GLASS SM (MISCELLANEOUS) ×4 IMPLANT
DERMABOND ADHESIVE PROPEN (GAUZE/BANDAGES/DRESSINGS) ×4
DERMABOND ADVANCED (GAUZE/BANDAGES/DRESSINGS) ×2
DERMABOND ADVANCED .7 DNX12 (GAUZE/BANDAGES/DRESSINGS) ×2 IMPLANT
DERMABOND ADVANCED .7 DNX6 (GAUZE/BANDAGES/DRESSINGS) ×4 IMPLANT
DRAPE C-ARM 42X72 X-RAY (DRAPES) ×4 IMPLANT
DRAPE CHEST BREAST 15X10 FENES (DRAPES) ×4 IMPLANT
ELECT REM PT RETURN 9FT ADLT (ELECTROSURGICAL) ×4
ELECTRODE REM PT RTRN 9FT ADLT (ELECTROSURGICAL) ×2 IMPLANT
GAUZE SPONGE 4X4 12PLY STRL (GAUZE/BANDAGES/DRESSINGS) ×4 IMPLANT
GLOVE BIOGEL PI IND STRL 7.0 (GLOVE) ×2 IMPLANT
GLOVE BIOGEL PI INDICATOR 7.0 (GLOVE) ×2
GLOVE SS BIOGEL STRL SZ 7.5 (GLOVE) ×2 IMPLANT
GLOVE SUPERSENSE BIOGEL SZ 7.5 (GLOVE) ×2
GOWN STRL REUS W/ TWL LRG LVL3 (GOWN DISPOSABLE) ×6 IMPLANT
GOWN STRL REUS W/TWL LRG LVL3 (GOWN DISPOSABLE) ×6
KIT BASIN OR (CUSTOM PROCEDURE TRAY) ×4 IMPLANT
KIT ROOM TURNOVER OR (KITS) ×4 IMPLANT
NEEDLE 18GX1X1/2 (RX/OR ONLY) (NEEDLE) ×4 IMPLANT
NEEDLE 22X1 1/2 (OR ONLY) (NEEDLE) IMPLANT
NEEDLE HYPO 25GX1X1/2 BEV (NEEDLE) ×4 IMPLANT
NS IRRIG 1000ML POUR BTL (IV SOLUTION) ×4 IMPLANT
PACK CV ACCESS (CUSTOM PROCEDURE TRAY) ×4 IMPLANT
PACK SURGICAL SETUP 50X90 (CUSTOM PROCEDURE TRAY) ×4 IMPLANT
PAD ARMBOARD 7.5X6 YLW CONV (MISCELLANEOUS) ×8 IMPLANT
SOAP 2 % CHG 4 OZ (WOUND CARE) ×4 IMPLANT
SUT ETHILON 3 0 PS 1 (SUTURE) ×4 IMPLANT
SUT PROLENE 6 0 CC (SUTURE) ×4 IMPLANT
SUT VIC AB 3-0 SH 27 (SUTURE) ×2
SUT VIC AB 3-0 SH 27X BRD (SUTURE) ×2 IMPLANT
SUT VICRYL 4-0 PS2 18IN ABS (SUTURE) ×4 IMPLANT
SYR 10ML LL (SYRINGE) ×4 IMPLANT
SYR 20CC LL (SYRINGE) ×4 IMPLANT
SYR 5ML LL (SYRINGE) ×8 IMPLANT
SYR CONTROL 10ML LL (SYRINGE) ×4 IMPLANT
TOWEL OR 17X24 6PK STRL BLUE (TOWEL DISPOSABLE) ×4 IMPLANT
TOWEL OR 17X26 10 PK STRL BLUE (TOWEL DISPOSABLE) ×4 IMPLANT
UNDERPAD 30X30 (UNDERPADS AND DIAPERS) ×4 IMPLANT
WATER STERILE IRR 1000ML POUR (IV SOLUTION) ×4 IMPLANT

## 2016-06-28 NOTE — Progress Notes (Signed)
Called report to OR for graft placement.

## 2016-06-28 NOTE — Progress Notes (Signed)
@IPLOG         PROGRESS NOTE                                                                                                                                                                                                             Patient Demographics:    Katie Nunez, is a 61 y.o. female, DOB - Mar 24, 1955, XIP:[id number]  Admit date - 06/25/2016   Admitting Physician Norval Morton, MD  Outpatient Primary MD for the patient is [name], MD  LOS - 3  No chief complaint on file.      Brief Narrative  Katie Nunez is a 61 y.o. female with medical history significant of scleroderma, rheumatoid arthritis, fibromyalgia, and IBS, who presents after being instructed by her nephrologist of recent abnormal lab work, she had recently seen Dr. Moshe Cipro nephrologist for rising creatinine levels, her creatinine last in May 2018 was 4.7, came to the ER after her creatinine was found to be close to 8 by PCP.   Subjective:   Patient in bed, appears comfortable, denies any headache, no fever, no chest pain or pressure, no shortness of breath , no abdominal pain. No focal weakness.   Assessment  & Plan :     1. Likely CKD5/ESRD. From the onboard due for renal biopsy on 06/28/2016 along with graft placement and temporary access for dialysis, plan is to initiate dialysis this admission and follow. Currently no uremic symptoms.  2. History of rheumatoid arthritis and scleroderma. This is not scleroderma renal crisis per nephrology, patient follows with Dr. Boris Lown rheumatologist at [facility] will follow with them post discharge.  3. Hypertension. Stable on Bystolic for now continue to monitor.  4. Anemia of chronic disease. Stable we'll continue to monitor.  5. Severe reflux. EKG chest x-ray unremarkable, placed on PPI and Zantac combination. Maalox few doses if needed.    Diet : Diet NPO time specified Except for: Sips with Meds    Family Communication  :  None  Code Status :   Full  Disposition Plan  :  TBD  Consults  :  Renal, VVS, IR  Procedures  :    Renal ultrasound. Consistent with intrinsic renal disease  Renal biopsy due on 06/28/2016  Graft placement for dialysis due on 06/28/2016    DVT Prophylaxis  :   SCD  Lab Results  Component Value Date   PLT 212 06/26/2016    Inpatient Medications  Scheduled Meds: . bisacodyl  10 mg Rectal Daily  . calcitRIOL  0.5 mcg Oral Daily  . darbepoetin (ARANESP) injection - NON-DIALYSIS  200 mcg Subcutaneous Q Sat-1800  . docusate sodium  200 mg Oral BID  . famotidine  40 mg Oral BID  . fluticasone  1 spray Each Nare BID  . loratadine  10 mg Oral Daily  . metoprolol succinate  50 mg Oral QHS  . multivitamin  1 tablet Oral QHS  . pantoprazole  40 mg Oral Daily   Continuous Infusions:  PRN Meds:.acetaminophen **OR** [DISCONTINUED] acetaminophen, alum & mag hydroxide-simeth, clonazePAM, hydrALAZINE, ipratropium-albuterol, [DISCONTINUED] ondansetron **OR** ondansetron (ZOFRAN) IV  Antibiotics  :    Anti-infectives    None         Objective:   Vitals:   06/27/16 0626 06/27/16 0940 06/27/16 2032 06/28/16 0940  BP: 129/63 129/64 (!) 147/73 137/72  Pulse: 67 67 (!) 59 60  Resp: 18 18 18 18   Temp: 98.2 F (36.8 C) 98.5 F (36.9 C) 98.4 F (36.9 C) 98.9 F (37.2 C)  TempSrc: Oral Oral Oral Oral  SpO2: 96% 96% 96% 98%  Weight:   85.1 kg (187 lb 9.8 oz)   Height:        Wt Readings from Last 3 Encounters:  06/27/16 85.1 kg (187 lb 9.8 oz)  01/27/16 84.8 kg (187 lb)  11/06/15 87.2 kg (192 lb 4.8 oz)     Intake/Output Summary (Last 24 hours) at 06/28/16 1044 Last data filed at 06/28/16 1000  Gross per 24 hour  Intake              540 ml  Output             1850 ml  Net            -1310 ml     Physical Exam  Awake Alert, Oriented X 3, No new F.N deficits, Normal affect Katie Nunez,PERRAL Supple Neck,No JVD, No cervical lymphadenopathy appriciated.  Symmetrical Chest wall movement,  Good air movement bilaterally, CTAB RRR,No Gallops,Rubs or new Murmurs, No Parasternal Heave +ve B.Sounds, Abd Soft, No tenderness, No organomegaly appriciated, No rebound - guarding or rigidity. No Cyanosis, Clubbing or edema, No new Rash or bruise    Data Review:    CBC  Recent Labs Lab 06/25/16 1458 06/26/16 0431  WBC 3.2* 3.8*  HGB 8.5* 8.4*  HCT 26.0* 25.9*  PLT 203 212  MCV 85.2 85.2  MCH 27.9 27.6  MCHC 32.7 32.4  RDW 14.2 14.1  LYMPHSABS 0.7  --   MONOABS 0.2  --   EOSABS 0.0  --   BASOSABS 0.0  --     Chemistries   Recent Labs Lab 06/25/16 1458 06/26/16 0431 06/27/16 0908 06/28/16 0620  NA 141 139 143 138  K 4.7 4.1 4.4 4.3  CL 114* 111 117* 113*  CO2 18* 18* 18* 16*  GLUCOSE 104* 90 96 90  BUN 88* 90* 90* 92*  CREATININE 8.33* 8.71* 9.04* 9.04*  CALCIUM 8.9 8.8* 9.3 9.3  AST 15  --   --   --   ALT 11*  --   --   --   ALKPHOS 48  --   --   --   BILITOT 0.3  --   --   --    ------------------------------------------------------------------------------------------------------------------ No results for input(s): CHOL, HDL, LDLCALC, TRIG, CHOLHDL, LDLDIRECT in the last 72 hours.  No results found for: HGBA1C ------------------------------------------------------------------------------------------------------------------ No results for input(s): TSH, T4TOTAL, T3FREE, THYROIDAB in the last 72 hours.  Invalid input(s): FREET3 ------------------------------------------------------------------------------------------------------------------  Recent Labs  06/25/16 1458  TIBC 295  IRON 59    Coagulation profile  Recent Labs Lab 06/26/16 1207  INR 1.08    No results for input(s): DDIMER in the last 72 hours.  Cardiac Enzymes  Recent Labs Lab 06/28/16 0931  TROPONINI <0.03   ------------------------------------------------------------------------------------------------------------------ No results found for: BNP  Micro  Results Recent Results (from the past 240 hour(s))  Surgical PCR screen     Status: None   Collection Time: 06/27/16  9:45 PM  Result Value Ref Range Status   MRSA, PCR NEGATIVE NEGATIVE Final   Staphylococcus aureus NEGATIVE NEGATIVE Final    Comment:        The Xpert SA Assay (FDA approved for NASAL specimens in patients over 71 years of age), is one component of a comprehensive surveillance program.  Test performance has been validated by [facility] for patients greater than or equal to 25 year old. It is not intended to diagnose infection nor to guide or monitor treatment.     Radiology Reports US Renal  Result Date: 06/25/2016 CLINICAL DATA:  Acute renal failure. EXAM: RENAL / URINARY TRACT ULTRASOUND COMPLETE COMPARISON:  None. FINDINGS: Right Kidney: Length: 10.9 cm. Diffusely increased echogenicity of the cortex. Mildly complicated cyst is seen in the superior pole of the right kidney measuring 1.2 x 1.2 x 1.1 cm. Left Kidney: Length: 10.4 cm.  Diffusely increased echogenicity. Bladder: Appears normal for degree of bladder distention. IMPRESSION: Markedly increased echogenicity of the bilateral kidneys, consistent with intrinsic renal disease. Electronically Signed   By: Fidela Salisbury M.D.   On: 06/25/2016 20:19   Dg Chest Port 1 View  Result Date: 06/28/2016 CLINICAL DATA:  61 year old female with mid sternal throbbing chest pain and shortness of breath EXAM: PORTABLE CHEST 1 VIEW COMPARISON:  Prior CT scan of the chest 06/13/2008 FINDINGS: Borderline cardiomegaly. Trace atherosclerotic calcifications present in the transverse aorta. The lungs are clear. No focal airspace consolidation, pleural effusion, pneumothorax or pulmonary edema. No suspicious pulmonary mass or nodule. Osseous structures are intact and unremarkable. IMPRESSION: 1. No acute cardiopulmonary process. 2. Borderline cardiomegaly. 3.  Aortic Atherosclerosis (ICD10-170.0) Electronically Signed   By: Jacqulynn Cadet M.D.   On: 06/28/2016 10:33    Time Spent in minutes  30   Lala Lund M.D on 06/28/2016 at 10:44 AM  Between 7am to 7pm - Pager - [phone] ( page via [email or web address], text pages only, please mention full 10 digit call back number). After 7pm go to [email or web address] - password [facility]

## 2016-06-28 NOTE — Anesthesia Preprocedure Evaluation (Signed)
Anesthesia Evaluation  Patient identified by MRN, date of birth, ID band Patient awake    Reviewed: Allergy & Precautions, NPO status , Patient's Chart, lab work & pertinent test results  Airway Mallampati: II  TM Distance: >3 FB Neck ROM: Full    Dental  (+) Teeth Intact, Dental Advisory Given   Pulmonary    breath sounds clear to auscultation       Cardiovascular hypertension,  Rhythm:Regular Rate:Normal     Neuro/Psych    GI/Hepatic   Endo/Other    Renal/GU      Musculoskeletal   Abdominal   Peds  Hematology   Anesthesia Other Findings   Reproductive/Obstetrics                             Anesthesia Physical Anesthesia Plan  ASA: III  Anesthesia Plan: MAC   Post-op Pain Management:    Induction: Intravenous  PONV Risk Score and Plan: 1 and Ondansetron, Dexamethasone and Propofol  Airway Management Planned: Natural Airway and Simple Face Mask  Additional Equipment:   Intra-op Plan:   Post-operative Plan:   Informed Consent: I have reviewed the patients History and Physical, chart, labs and discussed the procedure including the risks, benefits and alternatives for the proposed anesthesia with the patient or authorized representative who has indicated his/her understanding and acceptance.   Dental advisory given  Plan Discussed with: CRNA and Anesthesiologist  Anesthesia Plan Comments:         Anesthesia Quick Evaluation

## 2016-06-28 NOTE — Progress Notes (Signed)
Pt back form Sx.  Pt alert and oriented.  VSS. Denies pain. Family at bedside.

## 2016-06-28 NOTE — Op Note (Signed)
    OPERATIVE REPORT  DATE OF SURGERY: 06/28/2016  PATIENT: Katie Nunez, 61 y.o. female MRN: [id number]  DOB: Jul 08, 1955  PRE-OPERATIVE DIAGNOSIS: End-stage renal disease  POST-OPERATIVE DIAGNOSIS:  Same  PROCEDURE: #1 right IJ hemodialysis catheter with ultrasound visualization, #2 left brachiocephalic AV fistula creation  SURGEON:  Curt Jews, M.D.  PHYSICIAN ASSISTANT: Gerri Lins PA-C  ANESTHESIA:  Local with sedation  EBL: Minimal ml  No intake/output data recorded.  BLOOD ADMINISTERED: None  DRAINS: None  SPECIMEN: None  COUNTS CORRECT:  YES  PLAN OF CARE: PACU   PATIENT DISPOSITION:  PACU - hemodynamically stable  PROCEDURE DETAILS: Patient was taking up and placed supine position where the area the right and left neck and chest prepped in usual sterile fashion. SonoSite used to visualization to determine that the right internal jugular vein was widely patent. Using local anesthesia an 18-gauge needle under direct visualization with SonoSite the right internal jugular vein was entered and a guidewire was passed down to the level right atrium and this was confirmed with fluoroscopy. A dilator peel-away sheath was passed over the guidewire and a 23 cm hemodialysis catheter was passed through the peel-away sheath which was removed. The catheter tips were positioned the level the distal right atrium and the catheter was brought through a separate stab incision through subcutaneous tunnel. The 2 lm ports attached and were flushed and aspirated easily and were locked with 1000 unit per cc heparin. Catheter was secured to the skin with 3-0 nylon stitch and the entry site was closed with a 4 septic or Vicryl suture. Sterile dressing was applied.  Attention was then turned to the left arm. SonoSite revealed a good caliber of the cephalic vein at the antecubital space but small in the forearm. Using local anesthesia incision was made transversely at the antecubital space  and carried down to isolate the cephalic vein which was of good caliber. Temperature branches were ligated with 3 or 4 silk ties and divided. The vein was ligated distally and divided and was mobilized the level the brachial artery. Patient had a high bifurcation the brachial artery with 2 different arteries at the antecubital space. The deeper of the 2 was the larger and this was chosen for anastomosis. The artery was occluded proximally and distally and was opened with 11 range of motion with Potts scissors. The vein was cut to appropriate length and was spatulated and sewn end-to-side to the artery with a running 6-0 Prolene suture. Clamps removed and excellent thrill was noted. Irrigated with saline. Hemostasis tablet cautery. Wounds were closed with 3-0 Vicryl in the subcutaneous and subcuticular tissue. Sterile dressing was applied the patient was transferred to the recovery room in stable condition   Rosetta Posner, M.D., [facility] 06/28/2016 1:23 PM

## 2016-06-28 NOTE — Anesthesia Postprocedure Evaluation (Signed)
Anesthesia Post Note  Patient: Katie Nunez  Procedure(s) Performed: Procedure(s) (LRB): INSERTION OF DIALYSIS CATHETER, right internal jugular (Right) left arm ARTERIOVENOUS (AV) FISTULA CREATION (Left)     Patient location during evaluation: PACU Anesthesia Type: MAC Level of consciousness: awake, awake and alert and oriented Pain management: pain level controlled Vital Signs Assessment: post-procedure vital signs reviewed and stable Respiratory status: spontaneous breathing, nonlabored ventilation and respiratory function stable Cardiovascular status: blood pressure returned to baseline Postop Assessment: no headache Anesthetic complications: no    Last Vitals:  Vitals:   06/28/16 1400 06/28/16 1706  BP: (!) 153/80 (!) 147/65  Pulse:  69  Resp: 12 17  Temp:  36.4 C    Last Pain:  Vitals:   06/28/16 1706  TempSrc: Oral  PainSc:                  Evo Aderman COKER

## 2016-06-28 NOTE — Progress Notes (Signed)
Subjective: Interval History: s/p TDC and AVF creation today.  Feeling well.    Objective: Vital signs in last 24 hours: Temp:  [97.9 F (36.6 C)-98.9 F (37.2 C)] 97.9 F (36.6 C) (06/18 1345) Pulse Rate:  [59-60] 59 (06/18 1345) Resp:  [12-18] 12 (06/18 1400) BP: (133-153)/(69-80) 153/80 (06/18 1400) SpO2:  [96 %-100 %] 100 % (06/18 1400) Weight:  [85.1 kg (187 lb 9.8 oz)] 85.1 kg (187 lb 9.8 oz) (06/17 2032) Weight change: 0.7 kg (1 lb 8.7 oz)  Intake/Output from previous day: 06/17 0701 - 06/18 0700 In: 1020 [P.O.:1020] Out: 1850 [Urine:1850] Intake/Output this shift: No intake/output data recorded.  GEN NAD, a little sleepy s/p surgery but otherwise OK HEENT EOMI, PERRL, MMM NECK R TDC in place PULM clear bilaterally CV RRR no m/r/g ABD soft nontender NABS EXT no LE edema NEURO nonfocal ACCESS LUE AVF + T/B, R [facility]  Lab Results:  Recent Labs  06/25/16 1458 06/26/16 0431  WBC 3.2* 3.8*  HGB 8.5* 8.4*  HCT 26.0* 25.9*  PLT 203 212   BMET:   Recent Labs  06/27/16 0908 06/28/16 0620  NA 143 138  K 4.4 4.3  CL 117* 113*  CO2 18* 16*  GLUCOSE 96 90  BUN 90* 92*  CREATININE 9.04* 9.04*  CALCIUM 9.3 9.3    Recent Labs  06/25/16 1458  PTH 422*   Iron Studies:   Recent Labs  06/25/16 1458  IRON 59  TIBC 295    Studies/Results: Dg Chest Port 1 View  Result Date: 06/28/2016 CLINICAL DATA:  61 year old female with mid sternal throbbing chest pain and shortness of breath EXAM: PORTABLE CHEST 1 VIEW COMPARISON:  Prior CT scan of the chest 06/13/2008 FINDINGS: Borderline cardiomegaly. Trace atherosclerotic calcifications present in the transverse aorta. The lungs are clear. No focal airspace consolidation, pleural effusion, pneumothorax or pulmonary edema. No suspicious pulmonary mass or nodule. Osseous structures are intact and unremarkable. IMPRESSION: 1. No acute cardiopulmonary process. 2. Borderline cardiomegaly. 3.  Aortic Atherosclerosis  (ICD10-170.0) Electronically Signed   By: Jacqulynn Cadet M.D.   On: 06/28/2016 10:33   Dg Fluoro Guide Cv Line-no Report  Result Date: 06/28/2016 Fluoroscopy was utilized by the requesting physician.  No radiographic interpretation.    I have reviewed the patient's current medications.  Assessment/Plan: 1 CKD5/AKI  S/p access; bx pending.  Doubt reversible but will hopefully answer some question.  Course c/w CKD and afraid too late for reversibility. Plan for first HD tomorrow 2 HTN improved 3 Anemia esa, Give Fe 4 PTH vit D P Bx, access, Fe, esa, vit D   LOS: 3 days   Malick Netz 06/28/2016,2:37 PM

## 2016-06-28 NOTE — Transfer of Care (Signed)
Immediate Anesthesia Transfer of Care Note  Patient: Katie Nunez  Procedure(s) Performed: Procedure(s): INSERTION OF DIALYSIS CATHETER, right internal jugular (Right) left arm ARTERIOVENOUS (AV) FISTULA CREATION (Left)  Patient Location: PACU  Anesthesia Type:MAC  Level of Consciousness: awake, alert  and oriented  Airway & Oxygen Therapy: Patient Spontanous Breathing  Post-op Assessment: Report given to RN and Post -op Vital signs reviewed and stable  Post vital signs: Reviewed and stable  Last Vitals:  Vitals:   06/27/16 2032 06/28/16 0940  BP: (!) 147/73 137/72  Pulse: (!) 59 60  Resp: 18 18  Temp: 36.9 C 37.2 C    Last Pain:  Vitals:   06/28/16 0940  TempSrc: Oral  PainSc:       Patients Stated Pain Goal: 0 (03/54/65 6812)  Complications: No apparent anesthesia complications

## 2016-06-28 NOTE — Progress Notes (Signed)
Pt gone down via bed for graft placement.

## 2016-06-28 NOTE — Interval H&P Note (Signed)
History and Physical Interval Note:  06/28/2016 11:37 AM  Katie Nunez  has presented today for surgery, with the diagnosis of acute renal failure  The various methods of treatment have been discussed with the patient and family. After consideration of risks, benefits and other options for treatment, the patient has consented to  Procedure(s): INSERTION OF DIALYSIS CATHETER (N/A) ARTERIOVENOUS (AV) FISTULA CREATION (Left) as a surgical intervention .  The patient's history has been reviewed, patient examined, no change in status, stable for surgery.  I have reviewed the patient's chart and labs.  Questions were answered to the patient's satisfaction.     Curt Jews

## 2016-06-28 NOTE — H&P (View-Only) (Signed)
[facility] [place]  Patient name: Katie Nunez MRN: [id number] DOB: [birth year] Sex: female    HPI: Katie Nunez is a 61 y.o. female seen for discussion of AV access for hemodialysis. She presented with advanced kidney failure and is now need for access. I'd seen her as an outpatient in the past for superficial thrombophlebitis. He has never had hemodialysis access and has never had central catheters. No history of pacemaker placement.  Past Medical History:  Diagnosis Date  . Achalasia   . Allergy   . Dysplasia of cervix, low grade (CIN 1)   . Fibromyalgia   . Gout   . Hypertension   . IBS (irritable bowel syndrome)   . MVP (mitral valve prolapse)   . RA (rheumatoid arthritis) (HCC)    FOLLOWED BY DR. SHANAHAN  . Raynaud's disease   . Scleroderma ([place])   . Seasonal allergies   . Tubular adenoma 01/08/2008   CECUM  . Vitamin D deficiency     Family History  Problem Relation Age of Onset  . Hypertension Mother   . Diabetes Mother   . Heart disease Father   . Hypertension Maternal Aunt   . Diabetes Maternal Grandmother   . Heart disease Paternal Grandfather   . Cerebral palsy Cousin        1ST COUSIN?  . Diabetes Paternal Grandmother     SOCIAL HISTORY: Social History  Substance Use Topics  . Smoking status: Never Smoker  . Smokeless tobacco: Never Used  . Alcohol use No    Allergies  Allergen Reactions  . Savella [Milnacipran Hcl] Other (See Comments)    Unknown    Current Facility-Administered Medications  Medication Dose Route Frequency Provider Last Rate Last Dose  . acetaminophen (TYLENOL) tablet 650 mg  650 mg Oral Q6H PRN Norval Morton, MD       Or  . acetaminophen (TYLENOL) suppository 650 mg  650 mg Rectal Q6H PRN Fuller Plan A, MD      . clonazePAM (KLONOPIN) tablet 0.5 mg  0.5 mg Oral BID PRN Fuller Plan A, MD      . Darbepoetin Alfa (ARANESP) injection 200 mcg  200  mcg Subcutaneous Q Sat-1800 Deterding, James, MD      . hydrALAZINE (APRESOLINE) injection 10 mg  10 mg Intravenous Q4H PRN [name], Rondell A, MD      . ipratropium-albuterol (DUONEB) 0.5-2.5 (3) MG/3ML nebulizer solution 3 mL  3 mL Nebulization Q6H PRN [name], Rondell A, MD      . loratadine (CLARITIN) tablet 10 mg  10 mg Oral Daily [name], Rondell A, MD      . nebivolol (BYSTOLIC) tablet 10 mg  10 mg Oral Daily Tamala Julian, Rondell A, MD   10 mg at 06/25/16 1735  . ondansetron (ZOFRAN) tablet 4 mg  4 mg Oral Q6H PRN Norval Morton, MD       Or  . ondansetron (ZOFRAN) injection 4 mg  4 mg Intravenous Q6H PRN Norval Morton, MD        REVIEW OF SYSTEMS:  [X]  denotes positive finding, [ ]  denotes negative finding Cardiac  Comments:  Chest pain or chest pressure:    Shortness of breath upon exertion:    Short of breath when lying flat:    Irregular heart rhythm:        Vascular    Pain in calf, thigh, or hip brought on by ambulation:    Pain in feet  at night that wakes you up from your sleep:     Blood clot in your veins:    Leg swelling:  x         PHYSICAL EXAM: Vitals:   06/25/16 1541 06/25/16 2139 06/26/16 0443 06/26/16 0700  BP:  137/65 131/68 126/65  Pulse:  66 63 (!) 59  Resp:  17 18 18   Temp:  98.6 F (37 C) 97.7 F (36.5 C) 98.6 F (37 C)  TempSrc:  Oral Oral Oral  SpO2:  98% 97% 98%  Weight: 183 lb 11.2 oz (83.3 kg) 183 lb 10.3 oz (83.3 kg)    Height: 5\' 5"  (1.651 m)       GENERAL: The patient is a well-nourished female, in no acute distress. The vital signs are documented above. CARDIOVASCULAR: 2+ radial pulses bilaterally. Does have good caliber left arm cephalic vein at the antecubital space PULMONARY: There is good air exchange  MUSCULOSKELETAL: There are no major deformities or cyanosis. NEUROLOGIC: No focal weakness or paresthesias are detected. SKIN: There are no ulcers or rashes noted. PSYCHIATRIC: The patient has a normal affect.  DATA:  Vein mapping  pending  MEDICAL ISSUES: Had long discussion with patient regarding hemodialysis access via catheter, AV fistula and AV graft. Recommended placement of a catheter and left arm access. Physical exam she appears to be good candidate for fistula. We'll confirm this with vein map. We'll schedule surgery for Monday.    Rosetta Posner, MD FACS [facility] Tel [phone] Pager [phone]

## 2016-06-28 NOTE — Progress Notes (Signed)
Paged Dr. Candiss Norse stat EKG NSR, borderline, FYI

## 2016-06-28 NOTE — Anesthesia Procedure Notes (Signed)
Procedure Name: MAC Date/Time: 06/28/2016 11:54 AM Performed by: Trixie Deis A Pre-anesthesia Checklist: Patient identified, Emergency Drugs available, Patient being monitored, Suction available and Timeout performed Oxygen Delivery Method: Simple face mask Placement Confirmation: positive ETCO2 Dental Injury: Teeth and Oropharynx as per pre-operative assessment

## 2016-06-28 NOTE — Progress Notes (Signed)
Paged Dr. Candiss Norse, pt back from sx, may she have diet order?

## 2016-06-29 ENCOUNTER — Inpatient Hospital Stay (HOSPITAL_COMMUNITY): Payer: BLUE CROSS/BLUE SHIELD

## 2016-06-29 ENCOUNTER — Encounter (HOSPITAL_COMMUNITY): Payer: Self-pay | Admitting: Vascular Surgery

## 2016-06-29 LAB — BASIC METABOLIC PANEL
ANION GAP: 10 (ref 5–15)
BUN: 92 mg/dL — ABNORMAL HIGH (ref 6–20)
CALCIUM: 9.3 mg/dL (ref 8.9–10.3)
CO2: 18 mmol/L — ABNORMAL LOW (ref 22–32)
CREATININE: 9.15 mg/dL — AB (ref 0.44–1.00)
Chloride: 112 mmol/L — ABNORMAL HIGH (ref 101–111)
GFR, EST AFRICAN AMERICAN: 5 mL/min — AB (ref >60)
GFR, EST NON AFRICAN AMERICAN: 4 mL/min — AB (ref >60)
Glucose, Bld: 95 mg/dL (ref 65–99)
Potassium: 4.5 mmol/L (ref 3.5–5.1)
SODIUM: 140 mmol/L (ref 135–145)

## 2016-06-29 LAB — RENAL FUNCTION PANEL
Albumin: 2.9 g/dL — ABNORMAL LOW (ref 3.5–5.0)
Anion gap: 10 (ref 5–15)
BUN: 92 mg/dL — AB (ref 6–20)
CO2: 18 mmol/L — ABNORMAL LOW (ref 22–32)
CREATININE: 9.16 mg/dL — AB (ref 0.44–1.00)
Calcium: 9.4 mg/dL (ref 8.9–10.3)
Chloride: 111 mmol/L (ref 101–111)
GFR calc Af Amer: 5 mL/min — ABNORMAL LOW (ref >60)
GFR, EST NON AFRICAN AMERICAN: 4 mL/min — AB (ref >60)
GLUCOSE: 95 mg/dL (ref 65–99)
PHOSPHORUS: 5.8 mg/dL — AB (ref 2.5–4.6)
POTASSIUM: 4.5 mmol/L (ref 3.5–5.1)
SODIUM: 139 mmol/L (ref 135–145)

## 2016-06-29 LAB — CBC
HEMATOCRIT: 27.6 % — AB (ref 36.0–46.0)
Hemoglobin: 8.9 g/dL — ABNORMAL LOW (ref 12.0–15.0)
MCH: 27.7 pg (ref 26.0–34.0)
MCHC: 32.2 g/dL (ref 30.0–36.0)
MCV: 86 fL (ref 78.0–100.0)
PLATELETS: 209 10*3/uL (ref 150–400)
RBC: 3.21 MIL/uL — ABNORMAL LOW (ref 3.87–5.11)
RDW: 14.3 % (ref 11.5–15.5)
WBC: 5.7 10*3/uL (ref 4.0–10.5)

## 2016-06-29 MED ORDER — FENTANYL CITRATE (PF) 100 MCG/2ML IJ SOLN
INTRAMUSCULAR | Status: AC
  Filled 2016-06-29: qty 2

## 2016-06-29 MED ORDER — LIDOCAINE HCL (PF) 1 % IJ SOLN
INTRAMUSCULAR | Status: AC
  Filled 2016-06-29: qty 30

## 2016-06-29 MED ORDER — MIDAZOLAM HCL 2 MG/2ML IJ SOLN
INTRAMUSCULAR | Status: AC | PRN
  Administered 2016-06-29: 1 mg via INTRAVENOUS

## 2016-06-29 MED ORDER — FENTANYL CITRATE (PF) 100 MCG/2ML IJ SOLN
INTRAMUSCULAR | Status: AC | PRN
  Administered 2016-06-29: 50 ug via INTRAVENOUS

## 2016-06-29 MED ORDER — RENA-VITE PO TABS
1.0000 | ORAL_TABLET | Freq: Every day | ORAL | Status: DC
  Administered 2016-06-29 – 2016-07-07 (×9): 1 via ORAL
  Filled 2016-06-29 (×10): qty 1

## 2016-06-29 MED ORDER — SALINE SPRAY 0.65 % NA SOLN
1.0000 | NASAL | Status: DC | PRN
  Administered 2016-06-29: 1 via NASAL
  Filled 2016-06-29: qty 44

## 2016-06-29 MED ORDER — MIDAZOLAM HCL 2 MG/2ML IJ SOLN
INTRAMUSCULAR | Status: AC
  Filled 2016-06-29: qty 2

## 2016-06-29 NOTE — Progress Notes (Signed)
@IPLOG         PROGRESS NOTE                                                                                                                                                                                                             Patient Demographics:    Katie Nunez, is a 61 y.o. female, DOB - 11/15/1955, JWJ:[id number]  Admit date - 06/25/2016   Admitting Physician Norval Morton, MD  Outpatient Primary MD for the patient is [name], MD  LOS - 4  No chief complaint on file.      Brief Narrative  Katie Nunez is a 61 y.o. female with medical history significant of scleroderma, rheumatoid arthritis, fibromyalgia, and IBS, who presents after being instructed by her nephrologist of recent abnormal lab work, she had recently seen Dr. Moshe Cipro nephrologist for rising creatinine levels, her creatinine last in May 2018 was 4.7, came to the ER after her creatinine was found to be close to 8 by PCP.   Subjective:   Patient in bed, appears comfortable, denies any headache, no fever, no chest pain or pressure, no shortness of breath , no abdominal pain. No focal weakness.   Assessment  & Plan :     1. Likely CKD5/ESRD. From the onboard due for renal biopsy on 06/29/2016, Had a temporary right IJ dialysis catheter placed by vascular surgery on 06/28/2016 per nephrology plan is to initiate hemodialysis this admission.  2. History of rheumatoid arthritis and scleroderma. This is not scleroderma renal crisis per nephrology, patient follows with Dr. Boris Lown rheumatologist at [facility] will follow with them post discharge.  3. Hypertension. Stable on Bystolic for now continue to monitor.  4. Anemia of chronic disease. Stable we'll continue to monitor.  5. Severe reflux. EKG, chest x-ray unremarkable, placed on PPI and Zantac combination. Much improved.  6. Constipation. Placed on bowel regimen.   Diet : Diet NPO time specified Except for: Sips with Meds    Family  Communication  :  None  Code Status :  Full  Disposition Plan  :  TBD  Consults  :  Renal, VVS, IR  Procedures  :    Renal ultrasound. Consistent with intrinsic renal disease  Renal biopsy due on 06/28/2016  Graft placement for dialysis due on 06/28/2016  R IJ HD catheter by VVS 06-28-16    DVT Prophylaxis  :   SCD  Lab Results  Component Value Date   PLT 209 06/29/2016    Inpatient Medications  Scheduled Meds: .  bisacodyl  10 mg Rectal Daily  . calcitRIOL  0.5 mcg Oral Daily  . darbepoetin (ARANESP) injection - NON-DIALYSIS  200 mcg Subcutaneous Q Sat-1800  . docusate sodium  200 mg Oral BID  . famotidine  40 mg Oral Daily  . fluticasone  1 spray Each Nare BID  . loratadine  10 mg Oral Daily  . metoprolol succinate  50 mg Oral QHS  . multivitamin  1 tablet Oral Daily  . pantoprazole  40 mg Oral Daily   Continuous Infusions: . sodium chloride 10 mL/hr at 06/28/16 1112  . sodium chloride    . sodium chloride     PRN Meds:.sodium chloride, sodium chloride, acetaminophen **OR** [DISCONTINUED] acetaminophen, alteplase, alum & mag hydroxide-simeth, clonazePAM, heparin, hydrALAZINE, ipratropium-albuterol, lidocaine (PF), lidocaine-prilocaine, [DISCONTINUED] ondansetron **OR** ondansetron (ZOFRAN) IV, pentafluoroprop-tetrafluoroeth  Antibiotics  :    Anti-infectives    Start     Dose/Rate Route Frequency Ordered Stop   06/28/16 1139  ceFAZolin (ANCEF) 2-4 GM/100ML-% IVPB    Comments:  Katie Nunez, Katie Nunez   : cabinet override      06/28/16 1139 06/28/16 2344         Objective:   Vitals:   06/28/16 1706 06/28/16 2019 06/29/16 0423 06/29/16 0845  BP: (!) 147/65 132/69 118/62 120/68  Pulse: 69 60 62 62  Resp: 17 16 16 17   Temp: 97.6 F (36.4 C) 98.7 F (37.1 C) 99 F (37.2 C) 97.5 F (36.4 C)  TempSrc: Oral Oral Oral Oral  SpO2: 98% 100% 98% 98%  Weight:      Height:        Wt Readings from Last 3 Encounters:  06/27/16 85.1 kg (187 lb 9.8 oz)  01/27/16 84.8  kg (187 lb)  11/06/15 87.2 kg (192 lb 4.8 oz)     Intake/Output Summary (Last 24 hours) at 06/29/16 0909 Last data filed at 06/29/16 0424  Gross per 24 hour  Intake              358 ml  Output              800 ml  Net             -442 ml     Physical Exam  Awake Alert, Oriented X 3, No new F.N deficits, Normal affect [place].AT,PERRAL Supple Neck,No JVD, No cervical lymphadenopathy appriciated.  Symmetrical Chest wall movement, Good air movement bilaterally, CTAB, R IJ HD catheter RRR,No Gallops,Rubs or new Murmurs, No Parasternal Heave +ve B.Sounds, Abd Soft, No tenderness, No organomegaly appriciated, No rebound - guarding or rigidity. No Cyanosis, Clubbing or edema, No new Rash or bruise    Data Review:    CBC  Recent Labs Lab 06/25/16 1458 06/26/16 0431 06/28/16 1645 06/29/16 0546  WBC 3.2* 3.8* 6.6 5.7  HGB 8.5* 8.4* 9.4* 8.9*  HCT 26.0* 25.9* 29.1* 27.6*  PLT 203 212 210 209  MCV 85.2 85.2 86.1 86.0  MCH 27.9 27.6 27.8 27.7  MCHC 32.7 32.4 32.3 32.2  RDW 14.2 14.1 14.3 14.3  LYMPHSABS 0.7  --   --   --   MONOABS 0.2  --   --   --   EOSABS 0.0  --   --   --   BASOSABS 0.0  --   --   --     Chemistries   Recent Labs Lab 06/25/16 1458 06/26/16 0431 06/27/16 0908 06/28/16 0620 06/28/16 1645 06/29/16 0546  NA 141 139 143 138 140 140  139  K 4.7 4.1 4.4 4.3 4.6 4.5  4.5  CL 114* 111 117* 113* 113* 112*  111  CO2 18* 18* 18* 16* 18* 18*  18*  GLUCOSE 104* 90 96 90 94 95  95  BUN 88* 90* 90* 92* 86* 92*  92*  CREATININE 8.33* 8.71* 9.04* 9.04* 8.62* 9.15*  9.16*  CALCIUM 8.9 8.8* 9.3 9.3 9.3 9.3  9.4  AST 15  --   --   --   --   --   ALT 11*  --   --   --   --   --   ALKPHOS 48  --   --   --   --   --   BILITOT 0.3  --   --   --   --   --    ------------------------------------------------------------------------------------------------------------------ No results for input(s): CHOL, HDL, LDLCALC, TRIG, CHOLHDL, LDLDIRECT in the last 72  hours.  No results found for: HGBA1C ------------------------------------------------------------------------------------------------------------------ No results for input(s): TSH, T4TOTAL, T3FREE, THYROIDAB in the last 72 hours.  Invalid input(s): FREET3 ------------------------------------------------------------------------------------------------------------------ No results for input(s): VITAMINB12, FOLATE, FERRITIN, TIBC, IRON, RETICCTPCT in the last 72 hours.  Coagulation profile  Recent Labs Lab 06/26/16 1207  INR 1.08    No results for input(s): DDIMER in the last 72 hours.  Cardiac Enzymes  Recent Labs Lab 06/28/16 0931  TROPONINI <0.03   ------------------------------------------------------------------------------------------------------------------ No results found for: BNP  Micro Results Recent Results (from the past 240 hour(s))  Surgical PCR screen     Status: None   Collection Time: 06/27/16  9:45 PM  Result Value Ref Range Status   MRSA, PCR NEGATIVE NEGATIVE Final   Staphylococcus aureus NEGATIVE NEGATIVE Final    Comment:        The Xpert SA Assay (FDA approved for NASAL specimens in patients over 24 years of age), is one component of a comprehensive surveillance program.  Test performance has been validated by [facility] for patients greater than or equal to 69 year old. It is not intended to diagnose infection nor to guide or monitor treatment.     Radiology Reports US Renal  Result Date: 06/25/2016 CLINICAL DATA:  Acute renal failure. EXAM: RENAL / URINARY TRACT ULTRASOUND COMPLETE COMPARISON:  None. FINDINGS: Right Kidney: Length: 10.9 cm. Diffusely increased echogenicity of the cortex. Mildly complicated cyst is seen in the superior pole of the right kidney measuring 1.2 x 1.2 x 1.1 cm. Left Kidney: Length: 10.4 cm.  Diffusely increased echogenicity. Bladder: Appears normal for degree of bladder distention. IMPRESSION: Markedly  increased echogenicity of the bilateral kidneys, consistent with intrinsic renal disease. Electronically Signed   By: Fidela Salisbury M.D.   On: 06/25/2016 20:19   Dg Chest Port 1 View  Result Date: 06/28/2016 CLINICAL DATA:  61 year old female with mid sternal throbbing chest pain and shortness of breath EXAM: PORTABLE CHEST 1 VIEW COMPARISON:  Prior CT scan of the chest 06/13/2008 FINDINGS: Borderline cardiomegaly. Trace atherosclerotic calcifications present in the transverse aorta. The lungs are clear. No focal airspace consolidation, pleural effusion, pneumothorax or pulmonary edema. No suspicious pulmonary mass or nodule. Osseous structures are intact and unremarkable. IMPRESSION: 1. No acute cardiopulmonary process. 2. Borderline cardiomegaly. 3.  Aortic Atherosclerosis (ICD10-170.0) Electronically Signed   By: Jacqulynn Cadet M.D.   On: 06/28/2016 10:33   Dg Chest Port 1v Same Day  Result Date: 06/28/2016 CLINICAL DATA:  Dialysis catheter insertion. EXAM: PORTABLE CHEST 1 VIEW 3:26 p.m. COMPARISON:  06/28/2016  at 10:21 a.m. FINDINGS: Double-lumen dialysis catheter appears in excellent position. No pneumothorax. Heart size and vascularity are normal. Right lung is clear. Focal area of scarring or atelectasis at the left lung base posterior medially. IMPRESSION: Satisfactory postoperative appearance of the chest. Slight atelectasis or scarring at the left lung base. Catheter in good position. Electronically Signed   By: Lorriane Shire M.D.   On: 06/28/2016 15:38   Dg Fluoro Guide Cv Line-no Report  Result Date: 06/28/2016 Fluoroscopy was utilized by the requesting physician.  No radiographic interpretation.    Time Spent in minutes  30   Lala Lund M.D on 06/29/2016 at 9:09 AM  Between 7am to 7pm - Pager - [phone] ( page via [email or web address], text pages only, please mention full 10 digit call back number). After 7pm go to [email or web address] - password [email or web address]

## 2016-06-29 NOTE — Plan of Care (Signed)
Problem: Skin Integrity: Goal: Risk for impaired skin integrity will decrease Outcome: Progressing Skin intact; good po intake; pt. ambulatory; surgical incision good.

## 2016-06-29 NOTE — Progress Notes (Signed)
Subjective: Interval History: First HD today.  Hopefully will have IR biopsy today.     Objective: Vital signs in last 24 hours: Temp:  [97.5 F (36.4 C)-99 F (37.2 C)] 97.8 F (36.6 C) (06/19 1149) Pulse Rate:  [55-69] 65 (06/19 1149) Resp:  [12-17] 16 (06/19 1149) BP: (118-153)/(58-80) 121/58 (06/19 1149) SpO2:  [97 %-100 %] 97 % (06/19 1149) Weight:  [82.7 kg (182 lb 5.1 oz)] 82.7 kg (182 lb 5.1 oz) (06/19 1105) Weight change:   Intake/Output from previous day: 06/18 0701 - 06/19 0700 In: 358 [P.O.:290; I.V.:68] Out: 800 [Urine:800] Intake/Output this shift: No intake/output data recorded.  GEN NAD, appears to feel well HEENT EOMI, PERRL, MMM NECK R TDC in place PULM clear bilaterally CV RRR no m/r/g ABD soft nontender NABS EXT no LE edema NEURO nonfocal ACCESS LUE AVF + T/B, R [facility]  Lab Results:  Recent Labs  06/28/16 1645 06/29/16 0546  WBC 6.6 5.7  HGB 9.4* 8.9*  HCT 29.1* 27.6*  PLT 210 209   BMET:   Recent Labs  06/28/16 1645 06/29/16 0546  NA 140 140  139  K 4.6 4.5  4.5  CL 113* 112*  111  CO2 18* 18*  18*  GLUCOSE 94 95  95  BUN 86* 92*  92*  CREATININE 8.62* 9.15*  9.16*  CALCIUM 9.3 9.3  9.4   No results for input(s): PTH in the last 72 hours. Iron Studies:  No results for input(s): IRON, TIBC, TRANSFERRIN, FERRITIN in the last 72 hours.  Studies/Results: Dg Chest Port 1 View  Result Date: 06/28/2016 CLINICAL DATA:  61 year old female with mid sternal throbbing chest pain and shortness of breath EXAM: PORTABLE CHEST 1 VIEW COMPARISON:  Prior CT scan of the chest 06/13/2008 FINDINGS: Borderline cardiomegaly. Trace atherosclerotic calcifications present in the transverse aorta. The lungs are clear. No focal airspace consolidation, pleural effusion, pneumothorax or pulmonary edema. No suspicious pulmonary mass or nodule. Osseous structures are intact and unremarkable. IMPRESSION: 1. No acute cardiopulmonary process. 2. Borderline  cardiomegaly. 3.  Aortic Atherosclerosis (ICD10-170.0) Electronically Signed   By: Jacqulynn Cadet M.D.   On: 06/28/2016 10:33   Dg Chest Port 1v Same Day  Result Date: 06/28/2016 CLINICAL DATA:  Dialysis catheter insertion. EXAM: PORTABLE CHEST 1 VIEW 3:26 p.m. COMPARISON:  06/28/2016 at 10:21 a.m. FINDINGS: Double-lumen dialysis catheter appears in excellent position. No pneumothorax. Heart size and vascularity are normal. Right lung is clear. Focal area of scarring or atelectasis at the left lung base posterior medially. IMPRESSION: Satisfactory postoperative appearance of the chest. Slight atelectasis or scarring at the left lung base. Catheter in good position. Electronically Signed   By: Lorriane Shire M.D.   On: 06/28/2016 15:38   Dg Fluoro Guide Cv Line-no Report  Result Date: 06/28/2016 Fluoroscopy was utilized by the requesting physician.  No radiographic interpretation.    I have reviewed the patient's current medications.  Assessment/Plan: 1 CKD5/AKI  S/p access; bx to be done today hopefully--> will inquire.  Doubt reversible but will hopefully answer some question.  Course c/w CKD and afraid too late for reversibility. HD #2 tomorrow.   2.  Scleroderma: Followed in [place].   Not on any immunosuppressives at this time  2 HTN improved, on metoprolol daily  3 Anemia: Aranesp 200 mcg Q Saturday, given 06/26/16; Tsat 20%; given feraheme 510 mg x 1 on 6/17  4 Bone/ mineral: on calcitriol 0.5 mcg; Phos 5.8, may need to start binder   LOS:  4 days   Katie Nunez 06/29/2016,12:46 PM

## 2016-06-29 NOTE — Procedures (Signed)
Patient seen and examined on Hemodialysis. QB 200 via R TDC UF goal even.  Tolerating treatment #1 well.  Treatment adjusted as needed.  Madelon Lips MD [facility] pgr [phone] 12:52 PM

## 2016-06-29 NOTE — Procedures (Signed)
Random renal Bx 16 g core times two EBL 0 Comp 0

## 2016-06-29 NOTE — Progress Notes (Signed)
Patient ID: Katie Nunez, female   DOB: May 25, 1955, 61 y.o.   MRN: [id number] Report some soreness in her antecubital space and shoulder. Incision healing nicely with excellent thrill above her antecubital space. 2+ left radial pulse with no evidence of steal Right IJ catheter site without drainage. Will not follow actively. We'll see her in the office in one month for follow-up

## 2016-06-30 DIAGNOSIS — M349 Systemic sclerosis, unspecified: Secondary | ICD-10-CM

## 2016-06-30 DIAGNOSIS — N179 Acute kidney failure, unspecified: Principal | ICD-10-CM

## 2016-06-30 DIAGNOSIS — M05711 Rheumatoid arthritis with rheumatoid factor of right shoulder without organ or systems involvement: Secondary | ICD-10-CM

## 2016-06-30 DIAGNOSIS — N185 Chronic kidney disease, stage 5: Secondary | ICD-10-CM

## 2016-06-30 LAB — RENAL FUNCTION PANEL
ALBUMIN: 2.7 g/dL — AB (ref 3.5–5.0)
ANION GAP: 9 (ref 5–15)
BUN: 59 mg/dL — ABNORMAL HIGH (ref 6–20)
CALCIUM: 8.8 mg/dL — AB (ref 8.9–10.3)
CO2: 23 mmol/L (ref 22–32)
CREATININE: 7.08 mg/dL — AB (ref 0.44–1.00)
Chloride: 107 mmol/L (ref 101–111)
GFR calc non Af Amer: 6 mL/min — ABNORMAL LOW (ref >60)
GFR, EST AFRICAN AMERICAN: 7 mL/min — AB (ref >60)
Glucose, Bld: 92 mg/dL (ref 65–99)
PHOSPHORUS: 4.8 mg/dL — AB (ref 2.5–4.6)
Potassium: 3.9 mmol/L (ref 3.5–5.1)
SODIUM: 139 mmol/L (ref 135–145)

## 2016-06-30 MED ORDER — ONDANSETRON HCL 4 MG/2ML IJ SOLN
INTRAMUSCULAR | Status: AC
  Administered 2016-06-30: 4 mg via INTRAVENOUS
  Filled 2016-06-30: qty 2

## 2016-06-30 MED ORDER — DARBEPOETIN ALFA 200 MCG/0.4ML IJ SOSY
200.0000 ug | PREFILLED_SYRINGE | INTRAMUSCULAR | Status: DC
  Administered 2016-07-03: 200 ug via INTRAVENOUS

## 2016-06-30 MED ORDER — OXYMETAZOLINE HCL 0.05 % NA SOLN
3.0000 | Freq: Once | NASAL | Status: AC
  Administered 2016-06-30: 3 via NASAL
  Filled 2016-06-30: qty 15

## 2016-06-30 MED ORDER — WHITE PETROLATUM GEL
Status: AC
  Administered 2016-06-30: 21:00:00
  Filled 2016-06-30: qty 1

## 2016-06-30 MED ORDER — ALTEPLASE 2 MG IJ SOLR
INTRAMUSCULAR | Status: AC
  Administered 2016-06-30: 2 mg
  Filled 2016-06-30: qty 4

## 2016-06-30 MED ORDER — DARBEPOETIN ALFA 200 MCG/0.4ML IJ SOSY: 200.0000 ug | PREFILLED_SYRINGE | INTRAMUSCULAR | Status: DC

## 2016-06-30 MED ORDER — CYCLOBENZAPRINE HCL 5 MG PO TABS
5.0000 mg | ORAL_TABLET | Freq: Once | ORAL | Status: DC
  Filled 2016-06-30: qty 1

## 2016-06-30 NOTE — Procedures (Signed)
Patient seen and examined on Hemodialysis. QB 250 via R IJ TDC, UF goal even.    Having a little nausea at the very beginning of treatment so will keep BFR at 250 today instead of increasing to 300.  No heparin.    Treatment adjusted as needed.  Madelon Lips MD [facility] pgr [phone] 8:11 AM

## 2016-06-30 NOTE — Plan of Care (Signed)
Problem: Activity: Goal: Activity intolerance will improve Outcome: Completed/Met Date Met: 06/30/16 Pt. up and ambulating well; and independent with care.

## 2016-06-30 NOTE — Progress Notes (Signed)
Subjective: Interval History: s/p IR biopsy yesterday.  Tolerated well. Results pending.    Objective: Vital signs in last 24 hours: Temp:  [97.5 F (36.4 C)-98.9 F (37.2 C)] 98.4 F (36.9 C) (06/20 0708) Pulse Rate:  [55-75] 65 (06/20 0800) Resp:  [14-23] 18 (06/20 0708) BP: (113-150)/(46-82) 119/72 (06/20 0800) SpO2:  [95 %-100 %] 97 % (06/20 0708) Weight:  [82.3 kg (181 lb 7 oz)-82.7 kg (182 lb 5.1 oz)] 82.3 kg (181 lb 7 oz) (06/20 0708) Weight change:   Intake/Output from previous day: 06/19 0701 - 06/20 0700 In: 120 [P.O.:120] Out: 900 [Urine:900] Intake/Output this shift: No intake/output data recorded.  GEN on dialysis, appears a little nauseated w HEENT EOMI, PERRL, MMM NECK R TDC in place PULM clear bilaterally CV RRR no m/r/g ABD soft nontender NABS EXT no LE edema NEURO nonfocal ACCESS LUE AVF + T/B, R [facility]  Lab Results:  Recent Labs  06/28/16 1645 06/29/16 0546  WBC 6.6 5.7  HGB 9.4* 8.9*  HCT 29.1* 27.6*  PLT 210 209   BMET:   Recent Labs  06/29/16 0546 06/30/16 0432  NA 140  139 139  K 4.5  4.5 3.9  CL 112*  111 107  CO2 18*  18* 23  GLUCOSE 95  95 92  BUN 92*  92* 59*  CREATININE 9.15*  9.16* 7.08*  CALCIUM 9.3  9.4 8.8*   No results for input(s): PTH in the last 72 hours. Iron Studies:  No results for input(s): IRON, TIBC, TRANSFERRIN, FERRITIN in the last 72 hours.  Studies/Results: Dg Chest Port 1 View  Result Date: 06/28/2016 CLINICAL DATA:  61 year old female with mid sternal throbbing chest pain and shortness of breath EXAM: PORTABLE CHEST 1 VIEW COMPARISON:  Prior CT scan of the chest 06/13/2008 FINDINGS: Borderline cardiomegaly. Trace atherosclerotic calcifications present in the transverse aorta. The lungs are clear. No focal airspace consolidation, pleural effusion, pneumothorax or pulmonary edema. No suspicious pulmonary mass or nodule. Osseous structures are intact and unremarkable. IMPRESSION: 1. No acute  cardiopulmonary process. 2. Borderline cardiomegaly. 3.  Aortic Atherosclerosis (ICD10-170.0) Electronically Signed   By: Jacqulynn Cadet M.D.   On: 06/28/2016 10:33   Dg Chest Port 1v Same Day  Result Date: 06/28/2016 CLINICAL DATA:  Dialysis catheter insertion. EXAM: PORTABLE CHEST 1 VIEW 3:26 p.m. COMPARISON:  06/28/2016 at 10:21 a.m. FINDINGS: Double-lumen dialysis catheter appears in excellent position. No pneumothorax. Heart size and vascularity are normal. Right lung is clear. Focal area of scarring or atelectasis at the left lung base posterior medially. IMPRESSION: Satisfactory postoperative appearance of the chest. Slight atelectasis or scarring at the left lung base. Catheter in good position. Electronically Signed   By: Lorriane Shire M.D.   On: 06/28/2016 15:38   Dg Fluoro Guide Cv Line-no Report  Result Date: 06/28/2016 Fluoroscopy was utilized by the requesting physician.  No radiographic interpretation.    I have reviewed the patient's current medications.  Assessment/Plan: 1 CKD5/AKI--> ESRD: for HD #2 today.  S/p IR biopsy 6/19, appreciate assistance.  Reviewed labs drawn at [facility].  ANA negative, UP/C with 5.9 g protein, MPO and PR3 ANCA negative, atypical ANCA negative, c-ANCA negative, p-ANCA mildly positive titer 1:80.  Given questionable reversibility, MPO and PR3 ANCA negativity, ANA negativity, will await prelim biopsy results before embarking on treatment course.    2.  Scleroderma: followed by Dr. Boris Lown.    2 HTN improved, on metoprolol daily  3 Anemia: Aranesp 200 mcg Q Wednesday (first dose  today 6/20), actually not given 06/26/16 although ordered. Tsat 20%; given feraheme 510 mg x 1 on 6/17  4 Bone/ mineral: on calcitriol 0.5 mcg; Phos 5.8, may need to start binder   LOS: 5 days   Katie Nunez 06/30/2016,8:04 AM

## 2016-06-30 NOTE — Progress Notes (Signed)
PROGRESS NOTE                                                                                                                                                                                                           Patient Demographics:    Katie Nunez, is a 61 y.o. female, DOB - 09/20/1955, TIW:[id number]  Admit date - 06/25/2016   Admitting Physician Norval Morton, MD  Outpatient Primary MD for the patient is [name], MD  LOS - 5  Brief Narrative: 61 y.o. female with medical history significant of scleroderma, rheumatoid arthritis, fibromyalgia, and IBS, who presented after being instructed by her nephrologist of recent abnormal lab work, she had recently seen Dr. Moshe Cipro nephrologist for rising creatinine levels, her creatinine in May 2018 was 4.7, came to the ER after her creatinine was found to be close to 8 by PCP.   Subjective:   Pt reports nausea this AM, has vomited small amount, non bloody this am with HD session. Felt better after vomiting.    Assessment  & Plan :   CKD5, AKI --> ESRD - had renal bx on 06/29/2016 - so far: ANA negative, MPO and PR3 ANCA negative , c-ANCA negative, p- ANCA mildly positive 1:80 - awaiting for final biopsy report  - today is say #2 of HD treatment - appreciate nephrology team following   History of rheumatoid arthritis and scleroderma - per nephrology, not scleroderma renal crisis - pt follows Dr. Boris Lown rheumatologist at [facility]  HTN, essential - stable for now  Anemia of chronic disease - renal disease, scleroderma, rheumatoid arthritis   GERD - more nausea and vomiting this AM - feels better after vomiting - allo zofran as needed   Diet : Diet renal with fluid restriction Fluid restriction: 1200 mL Fluid; Room service appropriate? Yes; Fluid consistency: Thin    Family Communication  :  No family at bedside   Code Status :  Full   Disposition Plan  :  To be determined   Consults  :  Renal, VVS,  IR  Procedures  :    Renal ultrasound. Consistent with intrinsic renal disease  Renal biopsy due on 06/28/2016  Graft placement for dialysis due on 06/28/2016  R IJ HD catheter by VVS 06-28-16  DVT Prophylaxis  :   SCD  Lab Results  Component Value Date   PLT 209 06/29/2016    Inpatient Medications  Scheduled Meds: . bisacodyl  10 mg Rectal Daily  . calcitRIOL  0.5 mcg  Oral Daily  . [START ON 07/03/2016] darbepoetin (ARANESP) injection - DIALYSIS  200 mcg Intravenous Q Sat-HD  . docusate sodium  200 mg Oral BID  . famotidine  40 mg Oral Daily  . loratadine  10 mg Oral Daily  . metoprolol succinate  50 mg Oral QHS  . multivitamin  1 tablet Oral Daily  . pantoprazole  40 mg Oral Daily   Continuous Infusions: . sodium chloride 10 mL/hr at 06/28/16 1112  . sodium chloride    . sodium chloride     PRN Meds:.sodium chloride, sodium chloride, acetaminophen **OR** [DISCONTINUED] acetaminophen, alum & mag hydroxide-simeth, clonazePAM, hydrALAZINE, ipratropium-albuterol, lidocaine (PF), lidocaine-prilocaine, [DISCONTINUED] ondansetron **OR** ondansetron (ZOFRAN) IV, pentafluoroprop-tetrafluoroeth, sodium chloride  Antibiotics  :    Anti-infectives    Start     Dose/Rate Route Frequency Ordered Stop   06/28/16 1139  ceFAZolin (ANCEF) 2-4 GM/100ML-% IVPB    Comments:  Cato, Sarah   : cabinet override      06/28/16 1139 06/28/16 2344         Objective:   Vitals:   06/30/16 0930 06/30/16 1000 06/30/16 1031 06/30/16 1110  BP: 106/61 112/60 119/65 124/72  Pulse: 70 78 71 73  Resp:      Temp:   98.5 F (36.9 C)   TempSrc:   Oral   SpO2:   99%   Weight:      Height:        Wt Readings from Last 3 Encounters:  06/30/16 82.3 kg (181 lb 7 oz)  01/27/16 84.8 kg (187 lb)  11/06/15 87.2 kg (192 lb 4.8 oz)     Intake/Output Summary (Last 24 hours) at 06/30/16 1147 Last data filed at 06/30/16 1135  Gross per 24 hour  Intake              240 ml  Output             1163  ml  Net             -923 ml   Physical Exam  Constitutional: Appears well-developed CVS: RRR, S1/S2 +, no murmurs, no gallops, no carotid bruit.  Pulmonary: Effort and breath sounds normal, no stridor, rhonchi, wheezes, rales.  Abdominal: Soft. BS +,  no distension, tenderness, rebound or guarding.  Musculoskeletal: Normal range of motion. No edema and no tenderness.    Data Review:    CBC  Recent Labs Lab 06/25/16 1458 06/26/16 0431 06/28/16 1645 06/29/16 0546  WBC 3.2* 3.8* 6.6 5.7  HGB 8.5* 8.4* 9.4* 8.9*  HCT 26.0* 25.9* 29.1* 27.6*  PLT 203 212 210 209  MCV 85.2 85.2 86.1 86.0  MCH 27.9 27.6 27.8 27.7  MCHC 32.7 32.4 32.3 32.2  RDW 14.2 14.1 14.3 14.3  LYMPHSABS 0.7  --   --   --   MONOABS 0.2  --   --   --   EOSABS 0.0  --   --   --   BASOSABS 0.0  --   --   --     Chemistries   Recent Labs Lab 06/25/16 1458  06/27/16 0908 06/28/16 0620 06/28/16 1645 06/29/16 0546 06/30/16 0432  NA 141  < > 143 138 140 140  139 139  K 4.7  < > 4.4 4.3 4.6 4.5  4.5 3.9  CL 114*  < > 117* 113* 113* 112*  111 107  CO2 18*  < > 18* 16* 18* 18*  18* 23  GLUCOSE 104*  < >  96 90 94 95  95 92  BUN 88*  < > 90* 92* 86* 92*  92* 59*  CREATININE 8.33*  < > 9.04* 9.04* 8.62* 9.15*  9.16* 7.08*  CALCIUM 8.9  < > 9.3 9.3 9.3 9.3  9.4 8.8*  AST 15  --   --   --   --   --   --   ALT 11*  --   --   --   --   --   --   ALKPHOS 48  --   --   --   --   --   --   BILITOT 0.3  --   --   --   --   --   --   < > = values in this interval not displayed.  Coagulation profile  Recent Labs Lab 06/26/16 1207  INR 1.08   Cardiac Enzymes  Recent Labs Lab 06/28/16 0931  TROPONINI <0.03   ------------------------------------------------------------------------------------------------------------------ No results found for: BNP  Micro Results Recent Results (from the past 240 hour(s))  Surgical PCR screen     Status: None   Collection Time: 06/27/16  9:45 PM  Result Value  Ref Range Status   MRSA, PCR NEGATIVE NEGATIVE Final   Staphylococcus aureus NEGATIVE NEGATIVE Final    Comment:        The Xpert SA Assay (FDA approved for NASAL specimens in patients over 20 years of age), is one component of a comprehensive surveillance program.  Test performance has been validated by [facility] for patients greater than or equal to 35 year old. It is not intended to diagnose infection nor to guide or monitor treatment.     Radiology Reports US Renal  Result Date: 06/25/2016 CLINICAL DATA:  Acute renal failure. EXAM: RENAL / URINARY TRACT ULTRASOUND COMPLETE COMPARISON:  None. FINDINGS: Right Kidney: Length: 10.9 cm. Diffusely increased echogenicity of the cortex. Mildly complicated cyst is seen in the superior pole of the right kidney measuring 1.2 x 1.2 x 1.1 cm. Left Kidney: Length: 10.4 cm.  Diffusely increased echogenicity. Bladder: Appears normal for degree of bladder distention. IMPRESSION: Markedly increased echogenicity of the bilateral kidneys, consistent with intrinsic renal disease. Electronically Signed   By: Fidela Salisbury M.D.   On: 06/25/2016 20:19   US Biopsy  Result Date: 06/30/2016 INDICATION: Renal failure EXAM: ULTRASOUND-GUIDED BIOPSY RANDOM RENAL CORE BIOPSY MEDICATIONS: None. ANESTHESIA/SEDATION: Fentanyl 50 mcg IV; Versed 1 mg IV Moderate Sedation Time:  10 The patient was continuously monitored during the procedure by the interventional radiology nurse under my direct supervision. FLUOROSCOPY TIME:  None COMPLICATIONS: None immediate. PROCEDURE: Informed written consent was obtained from the patient after a thorough discussion of the procedural risks, benefits and alternatives. All questions were addressed. Maximal Sterile Barrier Technique was utilized including caps, mask, sterile gowns, sterile gloves, sterile drape, hand hygiene and skin antiseptic. A timeout was performed prior to the initiation of the procedure. The back was prepped  with ChloraPrep in a sterile fashion, and a sterile drape was applied covering the operative field. A sterile gown and sterile gloves were used for the procedure. Under sonographic guidance, two 16 gauge core biopsies of the right renal lower pole cortex were obtained. Final imaging was performed. Patient tolerated the procedure well without complication. Vital sign monitoring by nursing staff during the procedure will continue as patient is in the special procedures unit for post procedure observation. FINDINGS: The images document guide needle placement within the right renal  lower pole cortex. Post biopsy images demonstrate no hemorrhage. IMPRESSION: Successful ultrasound-guided random renal cortex core biopsy. Electronically Signed   By: Marybelle Killings M.D.   On: 06/30/2016 08:54   Dg Chest Port 1 View  Result Date: 06/28/2016 CLINICAL DATA:  61 year old female with mid sternal throbbing chest pain and shortness of breath EXAM: PORTABLE CHEST 1 VIEW COMPARISON:  Prior CT scan of the chest 06/13/2008 FINDINGS: Borderline cardiomegaly. Trace atherosclerotic calcifications present in the transverse aorta. The lungs are clear. No focal airspace consolidation, pleural effusion, pneumothorax or pulmonary edema. No suspicious pulmonary mass or nodule. Osseous structures are intact and unremarkable. IMPRESSION: 1. No acute cardiopulmonary process. 2. Borderline cardiomegaly. 3.  Aortic Atherosclerosis (ICD10-170.0) Electronically Signed   By: Jacqulynn Cadet M.D.   On: 06/28/2016 10:33   Dg Chest Port 1v Same Day  Result Date: 06/28/2016 CLINICAL DATA:  Dialysis catheter insertion. EXAM: PORTABLE CHEST 1 VIEW 3:26 p.m. COMPARISON:  06/28/2016 at 10:21 a.m. FINDINGS: Double-lumen dialysis catheter appears in excellent position. No pneumothorax. Heart size and vascularity are normal. Right lung is clear. Focal area of scarring or atelectasis at the left lung base posterior medially. IMPRESSION: Satisfactory  postoperative appearance of the chest. Slight atelectasis or scarring at the left lung base. Catheter in good position. Electronically Signed   By: Lorriane Shire M.D.   On: 06/28/2016 15:38   Dg Fluoro Guide Cv Line-no Report  Result Date: 06/28/2016 Fluoroscopy was utilized by the requesting physician.  No radiographic interpretation.    Time Spent in minutes  30 minutes   Faye Ramsay, MD  [facility] Pager [phone]  If 7PM-7AM, please contact night-coverage [email or web address] Password [email or web address]

## 2016-07-01 ENCOUNTER — Telehealth: Payer: Self-pay | Admitting: Vascular Surgery

## 2016-07-01 ENCOUNTER — Encounter (HOSPITAL_COMMUNITY): Payer: Self-pay | Admitting: Oncology

## 2016-07-01 DIAGNOSIS — N189 Chronic kidney disease, unspecified: Secondary | ICD-10-CM

## 2016-07-01 DIAGNOSIS — D696 Thrombocytopenia, unspecified: Secondary | ICD-10-CM

## 2016-07-01 HISTORY — DX: Thrombocytopenia, unspecified: D69.6

## 2016-07-01 LAB — RETICULOCYTES
RBC.: 3.02 MIL/uL — ABNORMAL LOW (ref 3.87–5.11)
RETIC COUNT ABSOLUTE: 105.7 10*3/uL (ref 19.0–186.0)
Retic Ct Pct: 3.5 % — ABNORMAL HIGH (ref 0.4–3.1)

## 2016-07-01 LAB — RENAL FUNCTION PANEL
ALBUMIN: 2.8 g/dL — AB (ref 3.5–5.0)
Anion gap: 9 (ref 5–15)
BUN: 34 mg/dL — AB (ref 6–20)
CALCIUM: 8.7 mg/dL — AB (ref 8.9–10.3)
CO2: 25 mmol/L (ref 22–32)
CREATININE: 6.36 mg/dL — AB (ref 0.44–1.00)
Chloride: 102 mmol/L (ref 101–111)
GFR, EST AFRICAN AMERICAN: 8 mL/min — AB (ref >60)
GFR, EST NON AFRICAN AMERICAN: 7 mL/min — AB (ref >60)
Glucose, Bld: 109 mg/dL — ABNORMAL HIGH (ref 65–99)
Phosphorus: 4.2 mg/dL (ref 2.5–4.6)
Potassium: 4 mmol/L (ref 3.5–5.1)
Sodium: 136 mmol/L (ref 135–145)

## 2016-07-01 LAB — CBC
HCT: 26.9 % — ABNORMAL LOW (ref 36.0–46.0)
HCT: 27.9 % — ABNORMAL LOW (ref 36.0–46.0)
HCT: 26.6 % — ABNORMAL LOW (ref 36.0–46.0)
HEMOGLOBIN: 8.8 g/dL — AB (ref 12.0–15.0)
Hemoglobin: 8.6 g/dL — ABNORMAL LOW (ref 12.0–15.0)
Hemoglobin: 8.7 g/dL — ABNORMAL LOW (ref 12.0–15.0)
MCH: 28.1 pg (ref 26.0–34.0)
MCH: 27.5 pg (ref 26.0–34.0)
MCH: 28.7 pg (ref 26.0–34.0)
MCHC: 32 g/dL (ref 30.0–36.0)
MCHC: 31.5 g/dL (ref 30.0–36.0)
MCHC: 32.7 g/dL (ref 30.0–36.0)
MCV: 87.9 fL (ref 78.0–100.0)
MCV: 87.2 fL (ref 78.0–100.0)
MCV: 87.8 fL (ref 78.0–100.0)
PLATELETS: 64 10*3/uL — AB (ref 150–400)
PLATELETS: 13 10*3/uL — AB (ref 150–400)
Platelets: 20 10*3/uL — CL (ref 150–400)
RBC: 3.06 MIL/uL — AB (ref 3.87–5.11)
RBC: 3.2 MIL/uL — AB (ref 3.87–5.11)
RBC: 3.03 MIL/uL — AB (ref 3.87–5.11)
RDW: 14.6 % (ref 11.5–15.5)
RDW: 14.4 % (ref 11.5–15.5)
RDW: 14.6 % (ref 11.5–15.5)
WBC: 6.1 10*3/uL (ref 4.0–10.5)
WBC: 6.5 10*3/uL (ref 4.0–10.5)
WBC: 8.9 10*3/uL (ref 4.0–10.5)

## 2016-07-01 LAB — APTT: aPTT: 35 seconds (ref 24–36)

## 2016-07-01 LAB — PROTIME-INR
INR: 1.12
Prothrombin Time: 14.4 seconds (ref 11.4–15.2)

## 2016-07-01 MED ORDER — SODIUM CHLORIDE 0.9 % IV SOLN
250.0000 mL | INTRAVENOUS | Status: DC | PRN
Start: 2016-07-01 — End: 2016-07-07

## 2016-07-01 MED ORDER — ARGATROBAN 50 MG/50ML IV SOLN
1.0000 ug/kg/min | INTRAVENOUS | Status: DC
  Administered 2016-07-01 – 2016-07-03 (×5): 1 ug/kg/min via INTRAVENOUS
  Filled 2016-07-01 (×6): qty 50

## 2016-07-01 MED ORDER — SODIUM CHLORIDE 0.9% FLUSH
3.0000 mL | Freq: Two times a day (BID) | INTRAVENOUS | Status: DC
  Administered 2016-07-01 – 2016-07-07 (×8): 3 mL via INTRAVENOUS

## 2016-07-01 MED ORDER — ONDANSETRON HCL 4 MG/2ML IJ SOLN
INTRAMUSCULAR | Status: AC
  Filled 2016-07-01: qty 2

## 2016-07-01 MED ORDER — SODIUM CHLORIDE 0.9 % IV SOLN
0.0500 mg/kg/h | INTRAVENOUS | Status: DC
  Filled 2016-07-01: qty 250

## 2016-07-01 MED ORDER — SODIUM CHLORIDE 0.9% FLUSH: 3.0000 mL | INTRAVENOUS | Status: DC | PRN

## 2016-07-01 MED ORDER — SODIUM CHLORIDE 0.9 % IV SOLN
125.0000 mg | INTRAVENOUS | Status: AC
  Administered 2016-07-01 – 2016-07-06 (×3): 125 mg via INTRAVENOUS
  Filled 2016-07-01 (×5): qty 10

## 2016-07-01 NOTE — Progress Notes (Signed)
Continued to have a good night rest after receiving Mylanta.

## 2016-07-01 NOTE — Progress Notes (Signed)
Night time coverage for hospitalist group aware of nasal bleeding. Aware ice pack and packing with cotton balls has helped some, but not achieving hemostasis. Afrin ordered. Will continue to monitor.

## 2016-07-01 NOTE — Consult Note (Addendum)
Referring MD: Dr. Mart Piggs, [facility]  PCP:  [name], MD  Hematology consultation  Reason for Referral: Acute onset of thrombocytopenia     HPI:  Pleasant articulate 61 year old woman who formerly worked as a Customer service manager.  She was followed for about 20 years by a local rheumatologist with symptoms and signs of an aytypical collagen vascular disorder.  She was ultimately diagnosed with scleroderma.  In September 2017 ANA +1: 1280 centromere pattern.  Rheumatoid factor elevated at 31.  ESR 45 mm.  Her symptoms have been primarily related to muscle soreness and stiffness.  Laboratory data available in our system from August 2015 showed normal renal function with a creatinine of 0.8.  Since that time she has developed progressive renal dysfunction.  In May of this year creatinine 4.7.  She is followed by [facility], [facility].  She has not been feeling well over the last month.  She has developed recurrent epistaxis and is seeing an ear nose and throat surgeon.  She has had no hematemesis, hematochezia, melena, hematuria, or vaginal bleeding.  She came in to the nephrologist's office on June 14 for routine lab work and was found to have a creatinine of 8.2.  Repeat on admission June 15 with BUN 88 and creatinine 8.3.  Urine shows 3+ protein.  Trace hemoglobin.  0-5 red cells.  Serum albumin 2.9.  All suggestive of nephrotic syndrome.  She has not been on chronic steroid therapy.  She has recurrent bouts of sinusitis and has had some as needed steroid courses.  She is on no immunosuppressive drugs.  She had a trial of a drug called Savella for her fibromyalgia symptoms many years ago but had an atypical reaction.  She does not remember the nature of the reaction.  No other medication allergies.  She was told to avoid all nonsteroidals when she started to develop renal failure.  No new recent medications.  At the time of current admission, a tunneled dialysis catheter  was placed on June 18 and dialysis initiated.  Concomitant creation of a left brachiocephalic AV fistula.  Low-dose heparin flushes given during the procedure.  Propofol ,Ancef,  Fentanyl, phenylephrine and Versed use as anesthetics.  A right kidney biopsy was done on June 19.  Platelet count on admission 203,000 and remained normal through June 19 when count was 209,000.  Next recorded count done this morning was 64,000.  This was repeated and was 20,000.  It was repeated again in a citrate anticoagulant and was 13,000.  I have confirmed by review of the peripheral blood film that she does have significant thrombocytopenia.  There has not been a concomitant fall in her hemoglobin.  She received heparin flushes on June 18.  19th, and 20th with dialysis.  She received a TPA flush with atelplase on June 20.  She has no known prior exposure to heparin.  Only surgeries were a C-section and surgery on a fractured right ankle with both procedures done many years ago. Additional medications given during this admission include pain medications at time of dialysis catheter placement with  fentanyl, Versed, epinephrine, Zofran.  2 doses of bisystolic for blood pressure control.  A dose of darbepoetin.  Pepcid and Protonix.  Claritin.  Metoprolol.  Calcitriol.  A list of prehospital medications can be found in the chart record.  None of them are usual offenders with respect to thrombocytopenia and the abrupt fall suggestis that this happened secondary to a medication given in the hospital.  Past Medical History:  Diagnosis Date  . Achalasia   . Allergy   . Dysplasia of cervix, low grade (CIN 1)   . Fibromyalgia   . Gout   . Hypertension   . IBS (irritable bowel syndrome)   . MVP (mitral valve prolapse)   . RA (rheumatoid arthritis) (HCC)    FOLLOWED BY DR. [facility] office- during [place]  . Raynaud's disease   . Scleroderma ([place])   . Seasonal allergies   . Thrombocytopenia ([place]) 07/01/2016    Acute fall to 13,000 07/01/16  . Tubular adenoma 01/08/2008   CECUM  . Vitamin D deficiency   :  Past Surgical History:  Procedure Laterality Date  . ANKLE SURGERY    . AV FISTULA PLACEMENT Left 06/28/2016   Procedure: left arm ARTERIOVENOUS (AV) FISTULA CREATION;  Surgeon: Rosetta Posner, MD;  Location: [place];  Service: Vascular;  Laterality: Left;  . CESAREAN SECTION    . CO2 LASER OF CERVIX    . COLONOSCOPY W/ BIOPSIES  01/08/2008  . INSERTION OF DIALYSIS CATHETER Right 06/28/2016   Procedure: INSERTION OF DIALYSIS CATHETER, right internal jugular;  Surgeon: Rosetta Posner, MD;  Location: [place];  Service: Vascular;  Laterality: Right;  . MYOMECTOMY    . PELVIC LAPAROSCOPY  2011  . superficial thrombophlebitis Left 07-2014  :  . bisacodyl  10 mg Rectal Daily  . calcitRIOL  0.5 mcg Oral Daily  . cyclobenzaprine  5 mg Oral Once  . [START ON 07/03/2016] darbepoetin (ARANESP) injection - DIALYSIS  200 mcg Intravenous Q Sat-HD  . docusate sodium  200 mg Oral BID  . famotidine  40 mg Oral Daily  . loratadine  10 mg Oral Daily  . metoprolol succinate  50 mg Oral QHS  . multivitamin  1 tablet Oral Daily  . pantoprazole  40 mg Oral Daily  . sodium chloride flush  3 mL Intravenous Q12H  :  Allergies  Allergen Reactions  . Heparin   . Savella [Milnacipran Hcl] Other (See Comments)    Unknown  :  Family History  Problem Relation Age of Onset  . Hypertension Mother   . Diabetes Mother   . Heart disease Father   . Hypertension Maternal Aunt   . Diabetes Maternal Grandmother   . Heart disease Paternal Grandfather   . Cerebral palsy Cousin        1ST COUSIN?  . Diabetes Paternal Grandmother   :  Social History   Social History  . Marital status: Married    Spouse name: N/A  . Number of children:  1 son who is healthy and in pharmacy school.  . Years of education: N/A   Occupational History  .  Work for [employer] for many years   Social History Main Topics  .  Smoking status: Never Smoker  . Smokeless tobacco: Never Used  . Alcohol use No  . Drug use: No  . Sexual activity: Yes    Birth control/ protection: Post-menopausal   Other Topics Concern  . Not on file   Social History Narrative  . No narrative on file  :  ROS: Eyes: No recent change in vision.  No dry eyes Throat: No dysphagia Neck: Resp: Questionable dyspnea on exertion.  No dyspnea at rest. Cardio: She has had some intermittent chest tightness since hospitalization which started with dialysis GI: History of irritable bowel syndrome but currently no GI complaints. Extremities: Some atypical shooting pains down her legs in  the last few days Lymph nodes:  Neurologic: No headaches.  Change in vision. Skin: .  No rash.  No ecchymoses. Genitourinary:   Vitals: Vitals:   07/01/16 1559 07/01/16 1840  BP: (!) 139/58 116/62  Pulse: 72 76  Resp: 16 16  Temp: 98.6 F (37 C) 99 F (37.2 C)    PHYSICAL EXAM:  General appearance: Pleasant well-nourished African-American woman HEENT: Sclerae slightly blue.  Pharynx no erythema or exudate.  No mass or ulcers. Lymph Nodes: No cervical, supraclavicular, or axillary adenopathy Resp: Lungs are clear to auscultation and resonant to percussion Cardio: Regular cardiac rhythm no murmur gallop or rub Vascular: Carotids 2+ no bruits.  Dorsalis pedis pulses 1+ symmetric Breasts: Not examined GI: Abdomen soft, nontender, no mass, no organomegaly GU: Right flank small wound from recent renal biopsy.  No hematoma formation.  No ecchymosis. Extremities: No edema, no calf tenderness, no cyanosis Neurologic: She is awake, alert, oriented 3, can do simple calculations easily, cranial nerves are grossly normal, motor strength 5/5 all extremities, reflexes 1+ symmetric, upper body coordination normal.  Gait not tested. Skin: No rash or ecchymosis  Labs:   Recent Labs  07/01/16 1315 07/01/16 1545  WBC 6.5 8.9  HGB 8.8* 8.7*  HCT 27.9*  26.6*  PLT 20* 13*    Recent Labs  06/30/16 0432 07/01/16 0415  NA 139 136  K 3.9 4.0  CL 107 102  CO2 23 25  GLUCOSE 92 109*  BUN 59* 34*  CREATININE 7.08* 6.36*  CALCIUM 8.8* 8.7*    Blood smear review: Normochromic normocytic red cells.  2+ polychromasia.  No spherocytes or schistocytes.  No inclusions.  Neutrophils are mature and lobation and granulation.  Lymphocytes are mature.  Platelets are markedly decreased 1-2 per high-power field correlating with the machine count of 20,000.  Impression: Rapid dramatic fall in platelet count.  Although heparin is the most common offending medication in hospitalized patients, time course here is atypical in someone with no prior history of sensitization with heparin products.  It is always possible that some of the anesthetic agents or the perioperative antibiotics in this case Ancef can cause an atypical reaction including autoimmune hemolysis and/or thrombocytopenia. I think her prehospital episodes of epistaxis were likely related to a uremic platelet defect as her kidney function was getting worse.  She had no excessive bleeding with previous surgical procedures.  She was on no antiplatelet agents.   Recommendation: I certainly agree with stopping all heparin products including flushes and checking a heparin associated thrombocytopenia and if positive, ELISA then getting the more specific serotonin release assay. Although any of her other medications could have provoked this response, I think we need to give her the benefit of the doubt in view of the high risk of thrombotic events if this is in fact heparin-induced thrombocytopenia and start her on a direct thrombin inhibitor.  Drug of choice in this woman with severe renal dysfunction but normal liver function is argatroban which is primarily cleared in the liver. There is polychromasia on smear.  There may be an element of hemolysis despite stable hemoglobin.  I will check an LDH and  a direct and indirect Coombs test.  Repeat liver functions.  If Coombs test positive then initiate a course of parenteral steroids.  I would avoid cephalosporin antibiotics. Monitor daily CBC and reticulocyte count.  Murriel Hopper, MD, [place]  Hematology-Oncology/Internal Medicine [phone]  07/01/2016, 7:39 PM

## 2016-07-01 NOTE — Progress Notes (Signed)
Subjective: Interval History: had some epistaxis yesterday, resolved with holding pressure and afrin.  Today feeling well.    Objective: Vital signs in last 24 hours: Temp:  [97.5 F (36.4 C)-98.8 F (37.1 C)] 98.3 F (36.8 C) (06/21 1205) Pulse Rate:  [63-88] 71 (06/21 1245) Resp:  [17-18] 18 (06/21 1205) BP: (105-137)/(51-73) 109/62 (06/21 1245) SpO2:  [94 %-100 %] 94 % (06/21 1205) Weight:  [82 kg (180 lb 12.4 oz)-82.7 kg (182 lb 5.1 oz)] 82.7 kg (182 lb 5.1 oz) (06/21 1205) Weight change: -0.4 kg (-14.1 oz)  Intake/Output from previous day: 06/20 0701 - 06/21 0700 In: 360 [P.O.:360] Out: 263 [Urine:300] Intake/Output this shift: Total I/O In: 360 [P.O.:360] Out: 0   GEN NAD, sitting in chair HEENT EOMI, PERRL, MMM NECK no JVD PULM clear bilaterally CV RRR no m/r/g ABD soft nontender NABS EXT no LE edema NEURO nonfocal ACCESS LUE AVF + T/B, R [facility]  Lab Results:  Recent Labs  06/29/16 0546 07/01/16 0415  WBC 5.7 6.1  HGB 8.9* 8.6*  HCT 27.6* 26.9*  PLT 209 64*   BMET:   Recent Labs  06/30/16 0432 07/01/16 0415  NA 139 136  K 3.9 4.0  CL 107 102  CO2 23 25  GLUCOSE 92 109*  BUN 59* 34*  CREATININE 7.08* 6.36*  CALCIUM 8.8* 8.7*   No results for input(s): PTH in the last 72 hours. Iron Studies:  No results for input(s): IRON, TIBC, TRANSFERRIN, FERRITIN in the last 72 hours.  Studies/Results: US Biopsy  Result Date: 06/30/2016 INDICATION: Renal failure EXAM: ULTRASOUND-GUIDED BIOPSY RANDOM RENAL CORE BIOPSY MEDICATIONS: None. ANESTHESIA/SEDATION: Fentanyl 50 mcg IV; Versed 1 mg IV Moderate Sedation Time:  10 The patient was continuously monitored during the procedure by the interventional radiology nurse under my direct supervision. FLUOROSCOPY TIME:  None COMPLICATIONS: None immediate. PROCEDURE: Informed written consent was obtained from the patient after a thorough discussion of the procedural risks, benefits and alternatives. All questions were  addressed. Maximal Sterile Barrier Technique was utilized including caps, mask, sterile gowns, sterile gloves, sterile drape, hand hygiene and skin antiseptic. A timeout was performed prior to the initiation of the procedure. The back was prepped with ChloraPrep in a sterile fashion, and a sterile drape was applied covering the operative field. A sterile gown and sterile gloves were used for the procedure. Under sonographic guidance, two 16 gauge core biopsies of the right renal lower pole cortex were obtained. Final imaging was performed. Patient tolerated the procedure well without complication. Vital sign monitoring by nursing staff during the procedure will continue as patient is in the special procedures unit for post procedure observation. FINDINGS: The images document guide needle placement within the right renal lower pole cortex. Post biopsy images demonstrate no hemorrhage. IMPRESSION: Successful ultrasound-guided random renal cortex core biopsy. Electronically Signed   By: Marybelle Killings M.D.   On: 06/30/2016 08:54    I have reviewed the patient's current medications.  Assessment/Plan: 1 CKD5/AKI--> ESRD: for HD #3 today.  S/p IR biopsy 6/19, appreciate assistance.  Reviewed labs drawn at [facility].  ANA negative, UP/C with 5.9 g protein, MPO and PR3 ANCA negative, atypical ANCA negative, c-ANCA negative, p-ANCA mildly positive titer 1:80.  Given questionable reversibility, MPO and PR3 ANCA negativity, ANA negativity, will await prelim biopsy results before embarking on treatment course.    2.  Scleroderma: followed by Dr. Boris Lown.    2 HTN improved, on metoprolol daily  3 Anemia: Aranesp 200 mcg Q Wednesday (  first dose 6/20), actually not given 06/26/16 although ordered. Tsat 20%; given feraheme 510 mg x 1 on 2/95, will give ferrlicet  4 Bone/ mineral: on calcitriol 0.5 mcg; Phos 5.8, may need to start binder   LOS: 6 days   Claudina Oliphant 07/01/2016,1:21 PM

## 2016-07-01 NOTE — Progress Notes (Signed)
Accepted at [facility] treatment is :Saturday 6/23 at 6:00 am if discharged on Friday need to go sign paperwork at 11:30am or 2:30pm .Tentative dialysis schedule is :Tuesday,Thursday,Saturday at 6:35 am

## 2016-07-01 NOTE — Progress Notes (Signed)
Patient ID: Katie Nunez, female   DOB: Aug 22, 1955, 61 y.o.   MRN: [id number]    PROGRESS NOTE    Katie Nunez  SWF:[id number] DOB: [birth year] DOA: 06/25/2016  PCP: [name], MD    Brief Narrative:  Patient is 61 year old female with known scleroderma, rheumatoid arthritis, fibromyalgia, presented after being instructed by her nephrologist due to worsening kidney function tests. Per her nephrologist, creatinine in May 2018 was 4.7, new creatinine close to 8.  Assessment & Plan:   CKD5, AKI --> ESRD - had renal bx on 06/29/2016 - so far: ANA negative, MPO and PR3 ANCA negative , c-ANCA negative, p- ANCA mildly positive 1:80 - Final biopsy report still pending - Plan on repeating hemodialysis treatment, today's treatment #3 - Appreciate nephrology team following  History of rheumatoid arthritis and scleroderma - per nephrology, not scleroderma renal crisis - pt follows Dr. Boris Lown rheumatologist at [facility]  HTN, essential - Reasonable inpatient control for now  Epistaxis - 1 episode yesterday, patient reports history of epistaxis and is seeing ENT - We'll consult over the phone - Okay to use nasal packing as needed  Anemia of chronic disease - renal disease, scleroderma, rheumatoid arthritis  - Hemoglobin overall stable, no evidence of active bleeding - CBC in the morning  Thrombocytopenia - Sudden drop in platelets from 209 --> 64 this a.m. - Possible blood work air, will repeat CBC  GERD - Had nausea and vomiting yesterday, now resolved  Obesity - Body mass index is 30.08 kg/m.   DVT prophylaxis: SCDs Code Status: Full Family Communication: Patient at bedside  Disposition Plan: will go home once cleared by nephrology team  Consultants:   Nephrology  IR  Vascular surgery  Procedures:   Renal ultrasound consistent with intrinsic renal disease  Renal biopsy done 06/28/2016  Graft placement for dialysis 06/28/2016  Right IJ  hemodialysis cath by vascular surgery placed 06/28/2016  Antimicrobials:   None  Subjective: No events overnight.  Objective: Vitals:   06/30/16 1857 06/30/16 2100 07/01/16 0613 07/01/16 1012  BP: (!) 105/56 117/73 (!) 108/51 128/64  Pulse: 79 76 67 88  Resp: 17 18 17 18   Temp: 98.4 F (36.9 C) 97.5 F (36.4 C) 98.7 F (37.1 C) 98.8 F (37.1 C)  TempSrc: Oral Oral  Oral  SpO2: 97% 95% 100% 96%  Weight:   82 kg (180 lb 12.4 oz)   Height:        Intake/Output Summary (Last 24 hours) at 07/01/16 1241 Last data filed at 07/01/16 1138  Gross per 24 hour  Intake              600 ml  Output                0 ml  Net              600 ml   Filed Weights   06/29/16 1105 06/30/16 0708 07/01/16 0613  Weight: 82.7 kg (182 lb 5.1 oz) 82.3 kg (181 lb 7 oz) 82 kg (180 lb 12.4 oz)    Examination:  General exam: Appears calm and comfortable  Respiratory system: Clear to auscultation. Respiratory effort normal. Cardiovascular system: S1 & S2 heard, RRR. No JVD, murmurs, rubs, gallops or clicks. No pedal edema. Gastrointestinal system: Abdomen is nondistended, soft and nontender. No organomegaly or masses felt. Normal bowel sounds heard. Central nervous system: Alert and oriented. No focal neurological deficits. Extremities: Symmetric 5 x 5 power. Left upper extremity aVF, positive  bruit, R TDC Skin: No rashes, lesions or ulcers Psychiatry: Judgement and insight appear normal. Mood & affect appropriate.   Data Reviewed: I have personally reviewed following labs and imaging studies  CBC:  Recent Labs Lab 06/25/16 1458 06/26/16 0431 06/28/16 1645 06/29/16 0546 07/01/16 0415  WBC 3.2* 3.8* 6.6 5.7 6.1  NEUTROABS 2.3  --   --   --   --   HGB 8.5* 8.4* 9.4* 8.9* 8.6*  HCT 26.0* 25.9* 29.1* 27.6* 26.9*  MCV 85.2 85.2 86.1 86.0 87.9  PLT 203 212 210 209 64*   Basic Metabolic Panel:  Recent Labs Lab 06/28/16 0620 06/28/16 1645 06/29/16 0546 Jul 02, 2016 0432 07/01/16 0415    NA 138 140 140  139 139 136  K 4.3 4.6 4.5  4.5 3.9 4.0  CL 113* 113* 112*  111 107 102  CO2 16* 18* 18*  18* 23 25  GLUCOSE 90 94 95  95 92 109*  BUN 92* 86* 92*  92* 59* 34*  CREATININE 9.04* 8.62* 9.15*  9.16* 7.08* 6.36*  CALCIUM 9.3 9.3 9.3  9.4 8.8* 8.7*  PHOS 4.9* 5.6* 5.8* 4.8* 4.2   Liver Function Tests:  Recent Labs Lab 06/25/16 1458  06/28/16 0620 06/28/16 1645 06/29/16 0546 July 02, 2016 0432 07/01/16 0415  AST 15  --   --   --   --   --   --   ALT 11*  --   --   --   --   --   --   ALKPHOS 48  --   --   --   --   --   --   BILITOT 0.3  --   --   --   --   --   --   PROT 7.1  --   --   --   --   --   --   ALBUMIN 2.9*  < > 2.9* 3.0* 2.9* 2.7* 2.8*  < > = values in this interval not displayed.  Coagulation Profile:  Recent Labs Lab 06/26/16 1207  INR 1.08   Cardiac Enzymes:  Recent Labs Lab 06/28/16 0931  TROPONINI <0.03   Urine analysis:    Component Value Date/Time   COLORURINE YELLOW 06/25/2016 1745   APPEARANCEUR HAZY (A) 06/25/2016 1745   LABSPEC 1.009 06/25/2016 1745   PHURINE 5.0 06/25/2016 1745   GLUCOSEU NEGATIVE 06/25/2016 1745   HGBUR SMALL (A) 06/25/2016 1745   BILIRUBINUR NEGATIVE 06/25/2016 1745   KETONESUR NEGATIVE 06/25/2016 1745   PROTEINUR >=300 (A) 06/25/2016 1745   NITRITE NEGATIVE 06/25/2016 1745   LEUKOCYTESUR NEGATIVE 06/25/2016 1745   Recent Results (from the past 240 hour(s))  Surgical PCR screen     Status: None   Collection Time: 06/27/16  9:45 PM  Result Value Ref Range Status   MRSA, PCR NEGATIVE NEGATIVE Final   Staphylococcus aureus NEGATIVE NEGATIVE Final    Comment:        The Xpert SA Assay (FDA approved for NASAL specimens in patients over 41 years of age), is one component of a comprehensive surveillance program.  Test performance has been validated by [facility] for patients greater than or equal to 51 year old. It is not intended to diagnose infection nor to guide or monitor treatment.       Radiology Studies: US Biopsy  Result Date: 07/02/2016 INDICATION: Renal failure EXAM: ULTRASOUND-GUIDED BIOPSY RANDOM RENAL CORE BIOPSY MEDICATIONS: None. ANESTHESIA/SEDATION: Fentanyl 50 mcg IV; Versed 1 mg IV Moderate  Sedation Time:  10 The patient was continuously monitored during the procedure by the interventional radiology nurse under my direct supervision. FLUOROSCOPY TIME:  None COMPLICATIONS: None immediate. PROCEDURE: Informed written consent was obtained from the patient after a thorough discussion of the procedural risks, benefits and alternatives. All questions were addressed. Maximal Sterile Barrier Technique was utilized including caps, mask, sterile gowns, sterile gloves, sterile drape, hand hygiene and skin antiseptic. A timeout was performed prior to the initiation of the procedure. The back was prepped with ChloraPrep in a sterile fashion, and a sterile drape was applied covering the operative field. A sterile gown and sterile gloves were used for the procedure. Under sonographic guidance, two 16 gauge core biopsies of the right renal lower pole cortex were obtained. Final imaging was performed. Patient tolerated the procedure well without complication. Vital sign monitoring by nursing staff during the procedure will continue as patient is in the special procedures unit for post procedure observation. FINDINGS: The images document guide needle placement within the right renal lower pole cortex. Post biopsy images demonstrate no hemorrhage. IMPRESSION: Successful ultrasound-guided random renal cortex core biopsy. Electronically Signed   By: Marybelle Killings M.D.   On: 06/30/2016 08:54    Scheduled Meds: . bisacodyl  10 mg Rectal Daily  . calcitRIOL  0.5 mcg Oral Daily  . cyclobenzaprine  5 mg Oral Once  . [START ON 07/03/2016] darbepoetin (ARANESP) injection - DIALYSIS  200 mcg Intravenous Q Sat-HD  . docusate sodium  200 mg Oral BID  . famotidine  40 mg Oral Daily  . loratadine  10  mg Oral Daily  . metoprolol succinate  50 mg Oral QHS  . multivitamin  1 tablet Oral Daily  . pantoprazole  40 mg Oral Daily   Continuous Infusions: . sodium chloride 10 mL/hr at 06/28/16 1112  . sodium chloride    . sodium chloride      LOS: 6 days   Time spent: 25 minutes   Faye Ramsay, MD [facility] Pager [phone]  If 7PM-7AM, please contact night-coverage [email or web address] Password [email or web address] 07/01/2016, 12:41 PM

## 2016-07-01 NOTE — Telephone Encounter (Signed)
Sched lab 08/11/16 at 4:00 and MD 08/18/16 at 11:30. Spoke to pt to confirm appts.

## 2016-07-01 NOTE — Procedures (Signed)
Patient seen and examined on Hemodialysis. QB 400 mL/ min UF goal 1L.   Treatment adjusted as needed.  Madelon Lips MD [facility] pgr [phone] 1:24 PM

## 2016-07-01 NOTE — Telephone Encounter (Signed)
-----   Message from Mena Goes, RN sent at 06/28/2016  1:59 PM EDT ----- Regarding: 4-6 weeks w/ duplex   ----- Message ----- From: Ulyses Amor, PA-C Sent: 06/28/2016   1:44 PM To: [facility] Charge Pool  F/U with DR. Early in 4-5 weeks with duplex of fistula

## 2016-07-01 NOTE — Progress Notes (Signed)
C/O indigestion. Mylanta administered as ordered.

## 2016-07-01 NOTE — Progress Notes (Signed)
Resting comfortably. Nasal bleeding ceased since using Afrin ordered by MD.

## 2016-07-01 NOTE — Progress Notes (Signed)
Patient has further questions regarding angiomax. MD notified X2. MD stated to speak with Beryle Beams, MD. Beryle Beams, MD stated that he would like to speak with Doyle Askew, MD. Doyle Askew gave permission to give her cell number to Beryle Beams, MD. Number given to Beryle Beams, MD. Will continue to monitor.

## 2016-07-01 NOTE — Progress Notes (Addendum)
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 Heparin   . Savella [Milnacipran Hcl] Other (See Comments)    Unknown    Patient Measurements: Height: 5\' 5"  (165.1 cm) Weight: 181 lb 3.5 oz (82.2 kg) IBW/kg (Calculated) : 57   Vital Signs: Temp: 98.6 F (37 C) (06/21 1559) Temp Source: Oral (06/21 1559) BP: 139/58 (06/21 1559) Pulse Rate: 72 (06/21 1559)  Labs:  Recent Labs  06/29/16 0546 06/30/16 0432 07/01/16 0415 07/01/16 1315 07/01/16 1545  HGB 8.9*  --  8.6* 8.8* 8.7*  HCT 27.6*  --  26.9* 27.9* 26.6*  PLT 209  --  64* 20* 13*  CREATININE 9.15*  9.16* 7.08* 6.36*  --   --     Estimated Creatinine Clearance: 10.2 mL/min (A) (by C-G formula based on SCr of 6.36 mg/dL (H)).   Medical History: Past Medical History:  Diagnosis Date  . Achalasia   . Allergy   . Dysplasia of cervix, low grade (CIN 1)   . Fibromyalgia   . Gout   . Hypertension   . IBS (irritable bowel syndrome)   . MVP (mitral valve prolapse)   . RA (rheumatoid arthritis) (HCC)    FOLLOWED BY DR. SHANAHAN  . Raynaud's disease   . Scleroderma ([place])   . Seasonal allergies   . Tubular adenoma 01/08/2008   CECUM  . Vitamin D deficiency     Medications:  Prescriptions Prior to Admission  Medication Sig Dispense Refill Last Dose  . allopurinol (ZYLOPRIM) 100 MG tablet Take 1 tablet (100 mg total) by mouth daily. 30 tablet 6 Past Week at Unknown time  . cetirizine (ZYRTEC) 10 MG tablet Take 10 mg by mouth daily.   Past Week at Unknown time  . cholecalciferol (VITAMIN D) 1000 UNITS tablet Take 2,000 Units by mouth daily.    Past Week at Unknown time  . clonazePAM (KLONOPIN) 0.5 MG tablet Take 0.5 mg by mouth 2 (two) times daily as needed for anxiety.    Past Month at Unknown time  . cyclobenzaprine (FLEXERIL) 10 MG tablet Take 10 mg  by mouth 3 (three) times daily as needed for muscle spasms.   PRN  . esomeprazole (NEXIUM) 40 MG capsule Take 40 mg by mouth daily as needed (for acid reflux).    Past Month at Unknown time  . levocetirizine (XYZAL) 5 MG tablet Take 1 tablet (5 mg total) by mouth daily. 90 tablet 2 Past Week at Unknown time  . Multiple Vitamins-Minerals (MULTIVITAMIN PO) Take 1 tablet by mouth daily.   Past Week at Unknown time  . nebivolol (BYSTOLIC) 10 MG tablet Take 10 mg by mouth daily.   06/24/2016 at Unknown time  . meloxicam (MOBIC) 15 MG tablet Take 1 tablet (15 mg total) by mouth daily. (Patient not taking: Reported on 01/27/2016) 30 tablet 2 Completed Course at Unknown time  . methylPREDNISolone (MEDROL DOSEPAK) 4 MG TBPK tablet Take as instructed (Patient not taking: Reported on 01/27/2016) 21 tablet 0 Completed Course at Unknown time  . methylPREDNISolone (MEDROL DOSEPAK) 4 MG TBPK tablet follow package directions (Patient not taking: Reported on 01/27/2016) 21 tablet 0 Completed Course at Unknown time  . methylPREDNISolone (MEDROL DOSEPAK) 4 MG TBPK tablet follow package directions (Patient not taking: Reported on 06/25/2016) 21 tablet 0 Completed Course at Unknown time  . Olopatadine HCl 0.6 % SOLN Use 1-2  sprays in each nostril once daily in the evening for congestion. (Patient not taking: Reported on 06/25/2016) 3 Bottle 2 Completed Course at Unknown time   Scheduled:  . bisacodyl  10 mg Rectal Daily  . calcitRIOL  0.5 mcg Oral Daily  . cyclobenzaprine  5 mg Oral Once  . [START ON 07/03/2016] darbepoetin (ARANESP) injection - DIALYSIS  200 mcg Intravenous Q Sat-HD  . docusate sodium  200 mg Oral BID  . famotidine  40 mg Oral Daily  . loratadine  10 mg Oral Daily  . metoprolol succinate  50 mg Oral QHS  . multivitamin  1 tablet Oral Daily  . pantoprazole  40 mg Oral Daily  . sodium chloride flush  3 mL Intravenous Q12H    Assessment:  61 y.o female with known scleroderma, rheumatoid arthritis  and   CKD5/AKI--> ESRD: had HD #3 today 07/01/16.  had renal bx on 06/29/2016, Final biopsy report still pending.  -S/p Graft placement for dialysis 06/28/2016. -Right IJ hemodialysis cath by vascular surgery placed 06/28/2016 She received heparin irrigation and heparin in dialysis on 06/28/16 On admission 06/25/16 the PLTC = 203k and PLTC remained wnl through 06/29/16.  No pltc checked again until today, 07/01/16 and PLTC c was 64k this morning. Repeat pltc was down to  13K this afternoon.   Patient had some epistaxis yesterday, resolved with holding pressure and afrin.  MD noted that patient reports history of epistaxis and is seeing ENT  Goal of Therapy:  PTT = 50-85 seconds---see ADDENDUM below (changed to Argatrogan , Goal =50-90sec) Monitor platelets by anticoagulation protocol: Yes   Plan:  STAT baseline aPTT and PT/INR  Begin Bivalirudin (Angiomax) 0.05 mg/kg/hr (no bolus)----- see ADDENDUM below Draw aPTT 2 hours after bivalirudin infusion started.  Adjust rate per aPTT response.     Thank you for allowing pharmacy to be part of this patients care team. Nicole Cella, RPh Clinical Pharmacist Pager: [phone] 07/01/2016,5:51 PM   ADDENDUM:   Hematology consulted, Dr. Beryle Beams reveiwed and discussed with Dr. Doyle Askew, recommended to use Argatroban infusion instead of Bivalirudin.    Goal: aPTT 50-90 seconds. Monitor platelets by anticoagulation protocol: Yes  Plan:  Begin Argatroban infusion 1 mcg/kg/min Check aPTT 2 hours after start of argatroban.  APTT every 2 hours until two therapeutic levels are obtained.  Nicole Cella, RPh Clinical Pharmacist Pager: [phone] 07/01/2016 7:39 PM

## 2016-07-01 NOTE — Progress Notes (Signed)
Katie Askew, MD notified of platelet count of 64. Md stated that we would continue to monitor lab value.

## 2016-07-02 LAB — CBC WITH DIFFERENTIAL/PLATELET
BASOS ABS: 0 10*3/uL (ref 0.0–0.1)
Basophils Relative: 0 %
EOS PCT: 0 %
Eosinophils Absolute: 0 10*3/uL (ref 0.0–0.7)
HCT: 25.3 % — ABNORMAL LOW (ref 36.0–46.0)
Hemoglobin: 8.2 g/dL — ABNORMAL LOW (ref 12.0–15.0)
LYMPHS ABS: 1.3 10*3/uL (ref 0.7–4.0)
Lymphocytes Relative: 18 %
MCH: 28.6 pg (ref 26.0–34.0)
MCHC: 32.4 g/dL (ref 30.0–36.0)
MCV: 88.2 fL (ref 78.0–100.0)
Monocytes Absolute: 0.8 10*3/uL (ref 0.1–1.0)
Monocytes Relative: 11 %
NEUTROS PCT: 71 %
Neutro Abs: 5 10*3/uL (ref 1.7–7.7)
Platelets: 21 10*3/uL — CL (ref 150–400)
RBC: 2.87 MIL/uL — ABNORMAL LOW (ref 3.87–5.11)
RDW: 14.6 % (ref 11.5–15.5)
WBC: 7.1 10*3/uL (ref 4.0–10.5)

## 2016-07-02 LAB — RENAL FUNCTION PANEL
ALBUMIN: 2.8 g/dL — AB (ref 3.5–5.0)
ANION GAP: 10 (ref 5–15)
BUN: 19 mg/dL (ref 6–20)
CHLORIDE: 99 mmol/L — AB (ref 101–111)
CO2: 26 mmol/L (ref 22–32)
Calcium: 8.3 mg/dL — ABNORMAL LOW (ref 8.9–10.3)
Creatinine, Ser: 4.49 mg/dL — ABNORMAL HIGH (ref 0.44–1.00)
GFR, EST AFRICAN AMERICAN: 11 mL/min — AB (ref >60)
GFR, EST NON AFRICAN AMERICAN: 10 mL/min — AB (ref >60)
Glucose, Bld: 109 mg/dL — ABNORMAL HIGH (ref 65–99)
PHOSPHORUS: 3.3 mg/dL (ref 2.5–4.6)
POTASSIUM: 3.3 mmol/L — AB (ref 3.5–5.1)
Sodium: 135 mmol/L (ref 135–145)

## 2016-07-02 LAB — HEPARIN INDUCED PLATELET AB (HIT ANTIBODY): HEPARIN INDUCED PLT AB: 0.283 {OD_unit} (ref 0.000–0.400)

## 2016-07-02 LAB — APTT
APTT: 56 s — AB (ref 24–36)
APTT: 56 s — AB (ref 24–36)

## 2016-07-02 LAB — DIRECT ANTIGLOBULIN TEST (NOT AT ARMC)
DAT, IGG: NEGATIVE
DAT, complement: NEGATIVE

## 2016-07-02 LAB — RETICULOCYTES
RBC.: 2.87 MIL/uL — ABNORMAL LOW (ref 3.87–5.11)
RETIC COUNT ABSOLUTE: 111.9 10*3/uL (ref 19.0–186.0)
RETIC CT PCT: 3.9 % — AB (ref 0.4–3.1)

## 2016-07-02 LAB — LACTATE DEHYDROGENASE: LDH: 278 U/L — ABNORMAL HIGH (ref 98–192)

## 2016-07-02 LAB — PATHOLOGIST SMEAR REVIEW

## 2016-07-02 MED ORDER — NEPRO/CARBSTEADY PO LIQD
237.0000 mL | Freq: Two times a day (BID) | ORAL | Status: DC
  Administered 2016-07-03 – 2016-07-05 (×2): 237 mL via ORAL

## 2016-07-02 MED ORDER — PRO-STAT SUGAR FREE PO LIQD
30.0000 mL | Freq: Two times a day (BID) | ORAL | Status: DC
  Administered 2016-07-02 – 2016-07-07 (×8): 30 mL via ORAL
  Filled 2016-07-02 (×9): qty 30

## 2016-07-02 NOTE — Progress Notes (Signed)
ANTICOAGULATION CONSULT NOTE - Follow Up Consult  Pharmacy Consult for Argatroban  Indication: Rule out HIT  Allergies  Allergen Reactions  . Heparin   . Savella [Milnacipran Hcl] Other (See Comments)    Unknown   Patient Measurements: Height: 5\' 5"  (165.1 cm) Weight: 180 lb (81.6 kg) IBW/kg (Calculated) : 57  Vital Signs: Temp: 99.4 F (37.4 C) (06/22 0500) Temp Source: Oral (06/22 0500) BP: 103/48 (06/22 0500) Pulse Rate: 78 (06/22 0500)  Labs:  Recent Labs  06/30/16 0432  07/01/16 0415 07/01/16 1315 07/01/16 1545 07/01/16 1819 07/01/16 2355 07/02/16 0535  HGB  --   < > 8.6* 8.8* 8.7*  --  8.2*  --   HCT  --   < > 26.9* 27.9* 26.6*  --  25.3*  --   PLT  --   < > 64* 20* 13*  --  21*  --   APTT  --   --   --   --   --  35 56* 56*  LABPROT  --   --   --   --   --  14.4  --   --   INR  --   --   --   --   --  1.12  --   --   CREATININE 7.08*  --  6.36*  --   --   --  4.49*  --   < > = values in this interval not displayed.  Estimated Creatinine Clearance: 14.4 mL/min (A) (by C-G formula based on SCr of 4.49 mg/dL (H)).  Assessment: 61 y/o F with hx scleroderma/RA, starting HD, platelets now with rapid decline (209>>64>>13), starting argatroban while undergoing HIT work-up. Hematology following.   PTT therapeutic this AM  Goal of Therapy:  aPTT 50-90 seconds Monitor platelets by anticoagulation protocol: Yes   Plan:  -Cont argatroban at 1 mcg/kg/min  -Daily PTT, CBC -Follow HIT labs  Thank you Anette Guarneri, PharmD [phone]  Tad Moore 07/02/2016,8:49 AM

## 2016-07-02 NOTE — Progress Notes (Signed)
See hematology consultation note from June 21 [place] test negative. LDH mildly elevated at 278 Minor decrease in hemoglobin 8.2 Platelets remain decreased at 21,000 Baseline coagulation studies PT and PTT were normal prior to starting Argatroban. Impression: Acute onset thrombocytopenia and low-grade nonimmune hemolysis HIT testing in progress.  Continue direct thrombin inhibitor pending results. Monitor daily CBCs.  Repeat chemistry profile to look at bilirubin.  Check haptoglobin.   Murriel Hopper, MD, [place]  Hematology-Oncology/Internal Medicine

## 2016-07-02 NOTE — Progress Notes (Signed)
[facility] Progress Note   Subjective:  Seen in room. Had another nosebleed. Hematology consulted due to drastic drop in platelets, HIT panel pending, on argatroban drip currently. Denies CP or dyspnea currently, says she had some chest pressure during HD yesterday. + leg cramping at times. + generalized fatigue/aches.   Objective Vitals:   07/01/16 1559 07/01/16 1840 07/01/16 2230 07/02/16 0500  BP: (!) 139/58 116/62 (!) 114/57 (!) 103/48  Pulse: 72 76 77 78  Resp: 16 16 16 18   Temp: 98.6 F (37 C) 99 F (37.2 C) 99.5 F (37.5 C) 99.4 F (37.4 C)  TempSrc: Oral Oral Oral Oral  SpO2: 95% 98% 97% 96%  Weight: 82.2 kg (181 lb 3.5 oz)  81.6 kg (180 lb)   Height:       Physical Exam General: Well appearing female, NAD Heart: RRR; no murmur Lungs: CTAB Extremities: No LE edema Dialysis Access: TDC in R chest, new LUE AVF + thrill/bruit  Additional Objective Labs: Basic Metabolic Panel:  Recent Labs Lab 06/30/16 0432 07/01/16 0415 07/01/16 2355  NA 139 136 135  K 3.9 4.0 3.3*  CL 107 102 99*  CO2 23 25 26   GLUCOSE 92 109* 109*  BUN 59* 34* 19  CREATININE 7.08* 6.36* 4.49*  CALCIUM 8.8* 8.7* 8.3*  PHOS 4.8* 4.2 3.3   Liver Function Tests:  Recent Labs Lab 06/25/16 1458  06/30/16 0432 07/01/16 0415 07/01/16 2355  AST 15  --   --   --   --   ALT 11*  --   --   --   --   ALKPHOS 48  --   --   --   --   BILITOT 0.3  --   --   --   --   PROT 7.1  --   --   --   --   ALBUMIN 2.9*  < > 2.7* 2.8* 2.8*  < > = values in this interval not displayed. CBC:  Recent Labs Lab 06/25/16 1458  06/29/16 0546 07/01/16 0415 07/01/16 1315 07/01/16 1545 07/01/16 2355  WBC 3.2*  < > 5.7 6.1 6.5 8.9 7.1  NEUTROABS 2.3  --   --   --   --   --  5.0  HGB 8.5*  < > 8.9* 8.6* 8.8* 8.7* 8.2*  HCT 26.0*  < > 27.6* 26.9* 27.9* 26.6* 25.3*  MCV 85.2  < > 86.0 87.9 87.2 87.8 88.2  PLT 203  < > 209 64* 20* 13* 21*  < > = values in this interval not  displayed.  Cardiac Enzymes:  Recent Labs Lab 06/28/16 0931  TROPONINI <0.03   Medications: . sodium chloride 10 mL/hr at 06/28/16 1112  . sodium chloride    . sodium chloride    . sodium chloride    . argatroban 1 mcg/kg/min (07/02/16 0617)  . ferric gluconate (FERRLECIT/NULECIT) IV Stopped (07/01/16 1623)   . bisacodyl  10 mg Rectal Daily  . calcitRIOL  0.5 mcg Oral Daily  . cyclobenzaprine  5 mg Oral Once  . [START ON 07/03/2016] darbepoetin (ARANESP) injection - DIALYSIS  200 mcg Intravenous Q Sat-HD  . docusate sodium  200 mg Oral BID  . famotidine  40 mg Oral Daily  . loratadine  10 mg Oral Daily  . metoprolol succinate  50 mg Oral QHS  . multivitamin  1 tablet Oral Daily  . pantoprazole  40 mg Oral Daily  . sodium chloride flush  3 mL  Intravenous Q12H    Dialysis Orders: establishing new orders  Overview: 61 year old BF with progressive renal failure. Hx systemic sclerosis, ?rheumatoid arthritis, fibromyalgia, gout, HTN. Cr 1.16 in 08/2014, 1.69 12/2014, 1.58 05/2015, 2.2 09/2015, 3.62 01/2016, 4.71 05/28/16 -> 9 on admit 6/15.  Assessment/Plan: 1. CKD5/AKI -> now ESRD: Prior CKA labs: ANA negative, Prot/Cr 5.9g, MPO and PR3 ANCA negative, atypical ANCA negative, c-ANCA negative, p-ANCA mildly positive titer 1:80. Renal u/s 6/15 with R 10.9cm, L 10.4cm kidneys with "markedly increased echogenicity." Underwent kidney biopsy 6/19, prelim results showing "membranous GN and collapsing variant FSGS with diffuse severe tubulointerstitial scarring". Await full report, but unlikely anything reversible based on the prelim result.   - S/p HD x 3. Next HD sched for Sat, 6/23; NO heparin. - S/p TDC and L AVF on 6/18. OP placement AF on TTS 2. Thrombocytopenia: New as of 6/21. Heme following. Being treated empirically for HIT with argatroban. Of note, she had not received heparin with HD. Per MAR, last given heparin 6/18. LDH high, coombs negative. HIT panel pending. 3. HTN/volume: BP  stable, on metoprolol 50mg  QHS only. 4. Anemia: Hgb 8.2. On Aranesp 200 mcg Q Wednesday (first dose 6/20), actually not given 06/26/16 although ordered. Tsat 20%; given feraheme 510 mg x 1 on 6/17, now getting course of Ferrelicit. 5. Secondary hyperparathyroidism: Corr Ca/Phos ok. On calcitriol 0. 31mcg daily. Not on binders. Phos 3.3 6. Nutrition: Alb 2.8. Adding protein supps. 7. Scleroderma: followed by Dr. Boris Lown.  Katie Penton, PA-C 07/02/2016, 8:48 AM  [facility] Pager: [phone]  Patient seen and examined, agree with above note with above modifications. Still with nosebleeds- with acute thrombocytopenia- no schistocytes- hematology has seen- heparin stopped, on argartroban drip.  HD initiation going pretty well.  Unfortunately biopsy with severe scarring does not indicate it would be reversible with any treatment, therefore likely ESRD Katie Parish, MD 07/02/2016

## 2016-07-02 NOTE — Progress Notes (Signed)
Patient is concerned about taking metoprolol because she is afraid of her B/P dropping. MD notified. Doyle Askew, MD stated that metoprolol could be held. Will pass on this information in report. Patient updated of this information. Will continue to monitor.

## 2016-07-02 NOTE — Progress Notes (Signed)
Patient ID: Katie Nunez, female   DOB: January 08, 1956, 61 y.o.   MRN: [id number]    PROGRESS NOTE    Katie Nunez  QJF:[id number] DOB: 1955-07-01 DOA: 06/25/2016  PCP: [name], MD    Brief Narrative:  Patient is 61 year old female with known scleroderma, rheumatoid arthritis, fibromyalgia, presented after being instructed by her nephrologist due to worsening kidney function tests. Per her nephrologist, creatinine in May 2018 was 4.7, new creatinine close to 8.  Assessment & Plan:   CKD5, AKI --> ESRD - had renal bx on 06/29/2016 - so far: ANA negative, MPO and PR3 ANCA negative , c-ANCA negative, p- ANCA mildly positive 1:80 - preliminary biopsy results with MEMRANOUS GN and collapsing variant FSGS with diffuse SEVERE TUBULOINTERSTITIAL SCARRING, full report pending, likely ESRD - pt is s/p HD x 3, next session scheduled for 6/23, NO HEPARIN  - s/p TDC and LAVF on 6/18, OP placement AF on TTS  History of rheumatoid arthritis and scleroderma - per nephrology, not scleroderma renal crisis - pt follows Dr. Boris Lown rheumatologist at [facility]  HTN, essential - reasonable control for now   Epistaxis - 1 episode on 6/20, patient reports history of epistaxis and is seeing ENT - nasal packing as needed   Anemia of chronic disease - renal disease, scleroderma, rheumatoid arthritis  - no evidence of active bleeding   Thrombocytopenia, acute onset and low grade non immune hemolysis  - Sudden drop in platelets from 209 --> 64 --> 13 K on 6/21 - so far Coombs test - - LDH mildly elevated  -  HIT testing pending  - continue direct thrombin inhibitor for now - appreciate Dr. Azucena Freed help   Hypokalemia - address with HD  GERD - stable   Obesity - Body mass index is 29.95 kg/m.  DVT prophylaxis: SCDs Code Status: Full Family Communication: pt at bedside  Disposition Plan: home once consulting teams clear  Consultants:   Nephrology  IR  Vascular  surgery  Heme/Onc  Procedures:   Renal ultrasound consistent with intrinsic renal disease  Renal biopsy done 06/28/2016  Graft placement for dialysis 06/28/2016  Right IJ hemodialysis cath by vascular surgery placed 06/28/2016  Antimicrobials:   None  Subjective: No events overnight, pt with muscle spasms, intermittent and worse after HD.   Objective: Vitals:   07/01/16 1840 07/01/16 2230 07/02/16 0500 07/02/16 0936  BP: 116/62 (!) 114/57 (!) 103/48 (!) 113/56  Pulse: 76 77 78 69  Resp: 16 16 18 18   Temp: 99 F (37.2 C) 99.5 F (37.5 C) 99.4 F (37.4 C) 98.7 F (37.1 C)  TempSrc: Oral Oral Oral Oral  SpO2: 98% 97% 96% 95%  Weight:  81.6 kg (180 lb)    Height:        Intake/Output Summary (Last 24 hours) at 07/02/16 1445 Last data filed at 07/02/16 0945  Gross per 24 hour  Intake              963 ml  Output             -276 ml  Net             1239 ml   Filed Weights   07/01/16 1205 07/01/16 1559 07/01/16 2230  Weight: 82.7 kg (182 lb 5.1 oz) 82.2 kg (181 lb 3.5 oz) 81.6 kg (180 lb)    Physical Exam  Constitutional: Appears well-developed and well-nourished. No distress.  CVS: RRR, S1/S2 +, no murmurs, no gallops, no carotid bruit.  Pulmonary: Effort and breath sounds normal, no stridor, rhonchi, wheezes, rales.  Abdominal: Soft. BS +,  no distension, tenderness, rebound or guarding.  Musculoskeletal: Normal range of motion. No edema and no tenderness.  Psychiatric: Normal mood and affect. Behavior, judgment, thought content normal.   Data Reviewed: I have personally reviewed following labs and imaging studies  CBC:  Recent Labs Lab 06/25/16 1458  06/29/16 0546 07/01/16 0415 07/01/16 1315 07/01/16 1545 07/01/16 2355  WBC 3.2*  < > 5.7 6.1 6.5 8.9 7.1  NEUTROABS 2.3  --   --   --   --   --  5.0  HGB 8.5*  < > 8.9* 8.6* 8.8* 8.7* 8.2*  HCT 26.0*  < > 27.6* 26.9* 27.9* 26.6* 25.3*  MCV 85.2  < > 86.0 87.9 87.2 87.8 88.2  PLT 203  < > 209 64* 20*  13* 21*  < > = values in this interval not displayed. Basic Metabolic Panel:  Recent Labs Lab 06/28/16 1645 06/29/16 0546 06/30/16 0432 07/01/16 0415 07/01/16 2355  NA 140 140  139 139 136 135  K 4.6 4.5  4.5 3.9 4.0 3.3*  CL 113* 112*  111 107 102 99*  CO2 18* 18*  18* 23 25 26   GLUCOSE 94 95  95 92 109* 109*  BUN 86* 92*  92* 59* 34* 19  CREATININE 8.62* 9.15*  9.16* 7.08* 6.36* 4.49*  CALCIUM 9.3 9.3  9.4 8.8* 8.7* 8.3*  PHOS 5.6* 5.8* 4.8* 4.2 3.3   Liver Function Tests:  Recent Labs Lab 06/25/16 1458  06/28/16 1645 06/29/16 0546 06/30/16 0432 07/01/16 0415 07/01/16 2355  AST 15  --   --   --   --   --   --   ALT 11*  --   --   --   --   --   --   ALKPHOS 48  --   --   --   --   --   --   BILITOT 0.3  --   --   --   --   --   --   PROT 7.1  --   --   --   --   --   --   ALBUMIN 2.9*  < > 3.0* 2.9* 2.7* 2.8* 2.8*  < > = values in this interval not displayed.  Coagulation Profile:  Recent Labs Lab 06/26/16 1207 07/01/16 1819  INR 1.08 1.12   Cardiac Enzymes:  Recent Labs Lab 06/28/16 0931  TROPONINI <0.03   Urine analysis:    Component Value Date/Time   COLORURINE YELLOW 06/25/2016 1745   APPEARANCEUR HAZY (A) 06/25/2016 1745   LABSPEC 1.009 06/25/2016 1745   PHURINE 5.0 06/25/2016 1745   GLUCOSEU NEGATIVE 06/25/2016 1745   HGBUR SMALL (A) 06/25/2016 1745   BILIRUBINUR NEGATIVE 06/25/2016 1745   KETONESUR NEGATIVE 06/25/2016 1745   PROTEINUR >=300 (A) 06/25/2016 1745   NITRITE NEGATIVE 06/25/2016 1745   LEUKOCYTESUR NEGATIVE 06/25/2016 1745   Recent Results (from the past 240 hour(s))  Surgical PCR screen     Status: None   Collection Time: 06/27/16  9:45 PM  Result Value Ref Range Status   MRSA, PCR NEGATIVE NEGATIVE Final   Staphylococcus aureus NEGATIVE NEGATIVE Final    Comment:        The Xpert SA Assay (FDA approved for NASAL specimens in patients over 98 years of age), is one component of a comprehensive  surveillance program.  Test performance has  been validated by [facility] for patients greater than or equal to 28 year old. It is not intended to diagnose infection nor to guide or monitor treatment.      Radiology Studies: No results found.  Scheduled Meds: . bisacodyl  10 mg Rectal Daily  . calcitRIOL  0.5 mcg Oral Daily  . cyclobenzaprine  5 mg Oral Once  . [START ON 07/03/2016] darbepoetin (ARANESP) injection - DIALYSIS  200 mcg Intravenous Q Sat-HD  . docusate sodium  200 mg Oral BID  . famotidine  40 mg Oral Daily  . feeding supplement (PRO-STAT SUGAR FREE 64)  30 mL Oral BID  . loratadine  10 mg Oral Daily  . metoprolol succinate  50 mg Oral QHS  . multivitamin  1 tablet Oral Daily  . pantoprazole  40 mg Oral Daily  . sodium chloride flush  3 mL Intravenous Q12H   Continuous Infusions: . sodium chloride 10 mL/hr at 06/28/16 1112  . sodium chloride    . sodium chloride    . sodium chloride    . argatroban 1 mcg/kg/min (07/02/16 1332)  . ferric gluconate (FERRLECIT/NULECIT) IV Stopped (07/01/16 1623)    LOS: 7 days   Time spent: 35 minutes   Faye Ramsay, MD [facility] Pager [phone]  If 7PM-7AM, please contact night-coverage [email or web address] Password [facility] 07/02/2016, 2:45 PM

## 2016-07-02 NOTE — Progress Notes (Signed)
ANTICOAGULATION CONSULT NOTE - Follow Up Consult  Pharmacy Consult for Argatroban  Indication: Rule out HIT  Allergies  Allergen Reactions  . Heparin   . Savella [Milnacipran Hcl] Other (See Comments)    Unknown   Patient Measurements: Height: 5\' 5"  (165.1 cm) Weight: 180 lb (81.6 kg) IBW/kg (Calculated) : 57  Vital Signs: Temp: 99.5 F (37.5 C) (06/21 2230) Temp Source: Oral (06/21 2230) BP: 114/57 (06/21 2230) Pulse Rate: 77 (06/21 2230)  Labs:  Recent Labs  06/29/16 0546 06/30/16 0432 07/01/16 0415 07/01/16 1315 07/01/16 1545 07/01/16 1819 07/01/16 2355  HGB 8.9*  --  8.6* 8.8* 8.7*  --  8.2*  HCT 27.6*  --  26.9* 27.9* 26.6*  --  25.3*  PLT 209  --  64* 20* 13*  --  PENDING  APTT  --   --   --   --   --  35 56*  LABPROT  --   --   --   --   --  14.4  --   INR  --   --   --   --   --  1.12  --   CREATININE 9.15*  9.16* 7.08* 6.36*  --   --   --   --     Estimated Creatinine Clearance: 10.2 mL/min (A) (by C-G formula based on SCr of 6.36 mg/dL (H)).  Assessment: 61 y/o F with hx scleroderma/RA, starting HD, platelets now with rapid decline (209>>64>>13), starting argatroban while undergoing HIT work-up. Hematology following. Initial aPTT is therapeutic at 56.   Goal of Therapy:  aPTT 50-90 seconds Monitor platelets by anticoagulation protocol: Yes   Plan:  -Cont argatroban at 1 mcg/kg/min  -Confirmatory aPTT with AM labs  Narda Bonds 07/02/2016,12:49 AM

## 2016-07-03 LAB — RETICULOCYTES
RBC.: 2.87 MIL/uL — ABNORMAL LOW (ref 3.87–5.11)
RETIC CT PCT: 5.7 % — AB (ref 0.4–3.1)
Retic Count, Absolute: 163.6 10*3/uL (ref 19.0–186.0)

## 2016-07-03 LAB — RENAL FUNCTION PANEL
ANION GAP: 12 (ref 5–15)
Albumin: 2.8 g/dL — ABNORMAL LOW (ref 3.5–5.0)
BUN: 49 mg/dL — ABNORMAL HIGH (ref 6–20)
CALCIUM: 8.8 mg/dL — AB (ref 8.9–10.3)
CO2: 26 mmol/L (ref 22–32)
CREATININE: 7.27 mg/dL — AB (ref 0.44–1.00)
Chloride: 99 mmol/L — ABNORMAL LOW (ref 101–111)
GFR calc Af Amer: 6 mL/min — ABNORMAL LOW (ref >60)
GFR calc non Af Amer: 6 mL/min — ABNORMAL LOW (ref >60)
Glucose, Bld: 93 mg/dL (ref 65–99)
POTASSIUM: 3.4 mmol/L — AB (ref 3.5–5.1)
Phosphorus: 4.8 mg/dL — ABNORMAL HIGH (ref 2.5–4.6)
Sodium: 137 mmol/L (ref 135–145)

## 2016-07-03 LAB — CBC WITH DIFFERENTIAL/PLATELET
Basophils Absolute: 0 10*3/uL (ref 0.0–0.1)
Basophils Relative: 1 %
EOS ABS: 0.1 10*3/uL (ref 0.0–0.7)
EOS PCT: 1 %
HCT: 25.8 % — ABNORMAL LOW (ref 36.0–46.0)
Hemoglobin: 8.1 g/dL — ABNORMAL LOW (ref 12.0–15.0)
LYMPHS ABS: 1.4 10*3/uL (ref 0.7–4.0)
Lymphocytes Relative: 25 %
MCH: 28.2 pg (ref 26.0–34.0)
MCHC: 31.4 g/dL (ref 30.0–36.0)
MCV: 89.9 fL (ref 78.0–100.0)
Monocytes Absolute: 0.6 10*3/uL (ref 0.1–1.0)
Monocytes Relative: 11 %
Neutro Abs: 3.4 10*3/uL (ref 1.7–7.7)
Neutrophils Relative %: 62 %
PLATELETS: 36 10*3/uL — AB (ref 150–400)
RBC: 2.87 MIL/uL — AB (ref 3.87–5.11)
RDW: 15.1 % (ref 11.5–15.5)
WBC: 5.5 10*3/uL (ref 4.0–10.5)

## 2016-07-03 LAB — LACTATE DEHYDROGENASE: LDH: 212 U/L — AB (ref 98–192)

## 2016-07-03 LAB — HAPTOGLOBIN: HAPTOGLOBIN: 112 mg/dL (ref 34–200)

## 2016-07-03 LAB — APTT: APTT: 46 s — AB (ref 24–36)

## 2016-07-03 MED ORDER — MAGIC MOUTHWASH
15.0000 mL | Freq: Four times a day (QID) | ORAL | Status: DC | PRN
  Administered 2016-07-04: 15 mL via ORAL
  Filled 2016-07-03: qty 15

## 2016-07-03 MED ORDER — DARBEPOETIN ALFA 200 MCG/0.4ML IJ SOSY
PREFILLED_SYRINGE | INTRAMUSCULAR | Status: AC
  Filled 2016-07-03: qty 0.4

## 2016-07-03 MED ORDER — ANTICOAGULANT SODIUM CITRATE 4% (200MG/5ML) IV SOLN
5.0000 mL | Status: DC | PRN
  Administered 2016-07-03: 1.7 mL
  Filled 2016-07-03: qty 250
  Filled 2016-07-03: qty 5
  Filled 2016-07-03: qty 250

## 2016-07-03 MED ORDER — FAMOTIDINE 20 MG PO TABS
20.0000 mg | ORAL_TABLET | Freq: Every day | ORAL | Status: DC
  Administered 2016-07-07: 20 mg via ORAL
  Filled 2016-07-03 (×6): qty 1

## 2016-07-03 NOTE — Progress Notes (Signed)
Hematology: Platelet count inching up: 36,000 HIT ELISA negative Hb 8.1; haptoglobin normal; LDH falling @ 212 Impression: Non heparin related thrombocytopenia Rec: Stop Argatroban Continue to monitor    Murriel Hopper, MD, FACP  Hematology-Oncology/Internal Medicine

## 2016-07-03 NOTE — Progress Notes (Signed)
[facility] Progress Note   Subjective:  Seen on HD.  No further nosebleeds HIT ELISA negative.  Has a dull headache.  Plts are improving.  Objective Vitals:   07/03/16 0711 07/03/16 0715 07/03/16 0745 07/03/16 0800  BP: 117/61 (!) 122/57 122/61 134/69  Pulse: 72 75 77 78  Resp:      Temp:      TempSrc:      SpO2:      Weight:      Height:       Physical Exam General: Well appearing female, NAD Heart: RRR; no murmur Lungs: CTAB Extremities: No LE edema Dialysis Access: TDC in R chest, new LUE AVF + thrill/bruit  Additional Objective Labs: Basic Metabolic Panel:  Recent Labs Lab 07/01/16 0415 07/01/16 2355 07/03/16 0424  NA 136 135 137  K 4.0 3.3* 3.4*  CL 102 99* 99*  CO2 25 26 26   GLUCOSE 109* 109* 93  BUN 34* 19 49*  CREATININE 6.36* 4.49* 7.27*  CALCIUM 8.7* 8.3* 8.8*  PHOS 4.2 3.3 4.8*   Liver Function Tests:  Recent Labs Lab 07/01/16 0415 07/01/16 2355 07/03/16 0424  ALBUMIN 2.8* 2.8* 2.8*   CBC:  Recent Labs Lab 07/01/16 0415 07/01/16 1315 07/01/16 1545 07/01/16 2355 07/03/16 0424  WBC 6.1 6.5 8.9 7.1 5.5  NEUTROABS  --   --   --  5.0 3.4  HGB 8.6* 8.8* 8.7* 8.2* 8.1*  HCT 26.9* 27.9* 26.6* 25.3* 25.8*  MCV 87.9 87.2 87.8 88.2 89.9  PLT 64* 20* 13* 21* 36*    Cardiac Enzymes:  Recent Labs Lab 06/28/16 0931  TROPONINI <0.03   Medications: . sodium chloride 10 mL/hr at 06/28/16 1112  . sodium chloride    . sodium chloride    . sodium chloride    . anticoagulant sodium citrate    . ferric gluconate (FERRLECIT/NULECIT) IV Stopped (07/01/16 1623)   . bisacodyl  10 mg Rectal Daily  . calcitRIOL  0.5 mcg Oral Daily  . cyclobenzaprine  5 mg Oral Once  . darbepoetin (ARANESP) injection - DIALYSIS  200 mcg Intravenous Q Sat-HD  . docusate sodium  200 mg Oral BID  . famotidine  20 mg Oral Daily  . feeding supplement (NEPRO CARB STEADY)  237 mL Oral BID BM  . feeding supplement (PRO-STAT SUGAR FREE 64)  30 mL Oral  BID  . loratadine  10 mg Oral Daily  . metoprolol succinate  50 mg Oral QHS  . multivitamin  1 tablet Oral Daily  . pantoprazole  40 mg Oral Daily  . sodium chloride flush  3 mL Intravenous Q12H    Dialysis Orders: establishing new orders  Overview: 61 year old BF with progressive renal failure. Hx systemic sclerosis, ?rheumatoid arthritis, fibromyalgia, gout, HTN. Cr 1.16 in 08/2014, 1.69 12/2014, 1.58 05/2015, 2.2 09/2015, 3.62 01/2016, 4.71 05/28/16 -> 9 on admit 6/15.  Assessment/Plan: 1. CKD5/AKI -> now ESRD: Prior [facility] labs: ANA negative, Prot/Cr 5.9g, MPO and PR3 ANCA negative, atypical ANCA negative, c-ANCA negative, p-ANCA mildly positive titer 1:80. Renal u/s 6/15 with R 10.9cm, L 10.4cm kidneys with "markedly increased echogenicity." Underwent kidney biopsy 6/19, prelim results showing "membranous GN and collapsing variant FSGS with diffuse severe tubulointerstitial scarring". She's likely ESRD - S/p HD x 3. Next HD sched for Sat, 6/23; No heparin. - S/p TDC and L AVF on 6/18.  Has outpt chair at AF, TTS.   2. Thrombocytopenia: New as of 6/21. HIT assay negative.  Stopping  argatroban gtt. 3. HTN/volume: BP stable, on metoprolol 50mg  QHS only. 4. Anemia: Hgb 8.2. On Aranesp 200 mcg Q Wednesday (first dose 6/20), actually not given 06/26/16 although ordered. Tsat 20%; given feraheme 510 mg x 1 on 6/17, now getting course of Ferrelicit. 5. Secondary hyperparathyroidism: Corr Ca/Phos ok. On calcitriol 0. 42mcg daily. Not on binders. Phos 3.3 6. Nutrition: Alb 2.8. Adding protein supps. 7. Scleroderma: followed by Dr. Boris Lown. 8. Dispo: has OP HD chair; pending plts.  Madelon Lips MD 07/03/2016, 8:39 AM  [facility] Pager: [phone]

## 2016-07-03 NOTE — Procedures (Signed)
Patient seen and examined on Hemodialysis. QB 400 mL/ min via R IJ TDC UF goal 1L.   Treatment adjusted as needed.  Madelon Lips MD [facility] pgr [phone] 8:43 AM

## 2016-07-03 NOTE — Procedures (Signed)
Patient was seen on dialysis and the procedure was supervised.  BFR 350  Via PC BP is  134/69.   Patient appears to be tolerating treatment well  Katie Nunez A 07/03/2016

## 2016-07-03 NOTE — Progress Notes (Signed)
[facility] Progress Note   Subjective:  Seen in dialysis- no more nosebleeds. Platelets increased some, HIT panel Dr. Darnell Level said was neg, on argatroban drip but due to stop. Denies CP or dyspnea + generalized fatigue/aches cont.   Objective Vitals:   07/03/16 0711 07/03/16 0715 07/03/16 0745 07/03/16 0800  BP: 117/61 (!) 122/57 122/61 134/69  Pulse: 72 75 77 78  Resp:      Temp:      TempSrc:      SpO2:      Weight:      Height:       Physical Exam General: Well appearing female, NAD Heart: RRR; no murmur Lungs: CTAB Extremities: No LE edema Dialysis Access: TDC in R chest, new LUE AVF + thrill/bruit  Additional Objective Labs: Basic Metabolic Panel:  Recent Labs Lab 07/01/16 0415 07/01/16 2355 07/03/16 0424  NA 136 135 137  K 4.0 3.3* 3.4*  CL 102 99* 99*  CO2 25 26 26   GLUCOSE 109* 109* 93  BUN 34* 19 49*  CREATININE 6.36* 4.49* 7.27*  CALCIUM 8.7* 8.3* 8.8*  PHOS 4.2 3.3 4.8*   Liver Function Tests:  Recent Labs Lab 07/01/16 0415 07/01/16 2355 07/03/16 0424  ALBUMIN 2.8* 2.8* 2.8*   CBC:  Recent Labs Lab 07/01/16 0415 07/01/16 1315 07/01/16 1545 07/01/16 2355 07/03/16 0424  WBC 6.1 6.5 8.9 7.1 5.5  NEUTROABS  --   --   --  5.0 3.4  HGB 8.6* 8.8* 8.7* 8.2* 8.1*  HCT 26.9* 27.9* 26.6* 25.3* 25.8*  MCV 87.9 87.2 87.8 88.2 89.9  PLT 64* 20* 13* 21* 36*    Cardiac Enzymes:  Recent Labs Lab 06/28/16 0931  TROPONINI <0.03   Medications: . sodium chloride 10 mL/hr at 06/28/16 1112  . sodium chloride    . sodium chloride    . sodium chloride    . anticoagulant sodium citrate    . ferric gluconate (FERRLECIT/NULECIT) IV Stopped (07/01/16 1623)   . bisacodyl  10 mg Rectal Daily  . calcitRIOL  0.5 mcg Oral Daily  . cyclobenzaprine  5 mg Oral Once  . darbepoetin (ARANESP) injection - DIALYSIS  200 mcg Intravenous Q Sat-HD  . docusate sodium  200 mg Oral BID  . famotidine  20 mg Oral Daily  . feeding supplement (NEPRO CARB  STEADY)  237 mL Oral BID BM  . feeding supplement (PRO-STAT SUGAR FREE 64)  30 mL Oral BID  . loratadine  10 mg Oral Daily  . metoprolol succinate  50 mg Oral QHS  . multivitamin  1 tablet Oral Daily  . pantoprazole  40 mg Oral Daily  . sodium chloride flush  3 mL Intravenous Q12H    Dialysis Orders: establishing new orders  Overview: 61 year old BF with progressive renal failure. Hx systemic sclerosis, ?rheumatoid arthritis, fibromyalgia, gout, HTN. Cr 1.16 in 08/2014, 1.69 12/2014, 1.58 05/2015, 2.2 09/2015, 3.62 01/2016, 4.71 05/28/16 -> 9 on admit 6/15.  Assessment/Plan: 1. CKD5/AKI -> now ESRD:serologies neg except p-ANCA mildly positive titer 1:80. Renal u/s  "markedly increased echogenicity." Underwent kidney biopsy 6/19, prelim results showing "membranous GN and collapsing variant FSGS with diffuse severe tubulointerstitial scarring". Await full report, but unlikely anything reversible based on the prelim result.   - S/p HD x 3. Next HD sched for today, 6/23; NO heparin. - S/p TDC and L AVF on 6/18. OP placement AF on TTS 2. Thrombocytopenia: New as of 6/21. Heme following. Being treated empirically for HIT  with argatroban.  Per MAR, last given heparin 6/18. LDH high, coombs negative. HIT panel pending- heme says neg but will cont no heparin. Platelets trending up 3. HTN/volume: BP stable, on metoprolol 50mg  QHS only. Not volume overloaded, making urine- running even today  4. Anemia: Hgb 8.2- stable. On Aranesp 200 mcg Q Wednesday (first dose 6/20),  Tsat 20%; given feraheme 510 mg x 1 on 6/17, now getting course of Ferrelicit. 5. Secondary hyperparathyroidism: Corr Ca/Phos ok. On calcitriol 0. 80mcg daily- PTH 422. Not on binders. Phos 4.8 6. Nutrition: Alb 2.8. Adding protein supps. 7. Scleroderma: followed by Dr. Boris Lown. 8. Dispo- once platelets to acceptable level and convinced they will cont to rise- has OP spot so could be discharged, maybe early next week ??  Corliss Parish,  MD 07/03/2016

## 2016-07-03 NOTE — Progress Notes (Signed)
Nutrition Brief Note  Patient identified on the Malnutrition Screening Tool (MST) Report. Patient with minimal weight loss, not significant for the time frame. She reports eating well PTA and since admission. S/P HD today.  Nutrition-Focused physical exam completed. Findings are no fat depletion, no muscle depletion, and no edema.   Body mass index is 30.49 kg/m. Patient meets criteria for obesity based on current BMI. She would like to lose some weight.  Current diet order is Renal with 1200 ml fluid restriction, patient is consuming approximately 100% of meals at this time. She is also receiving Pro-stat supplements 30 ml BID as ordered by physician, each serving has 100 kcals and 15 gm protein. Labs and medications reviewed.   Patient requested education on a renal diet. RD explained why diet restrictions are needed and provided lists of foods to limit/avoid that are high potassium, sodium, and phosphorus.   Discussed importance of protein intake at each meal and snack. Provided examples of how to maximize protein intake throughout the day. Discussed need for fluid restriction with dialysis, importance of minimizing weight gain between HD treatments, and renal-friendly beverage options.  Encouraged pt to discuss specific diet questions/concerns with RD at HD outpatient facility. Teach back method used.  Expect good compliance.  No further nutrition interventions warranted at this time. If nutrition issues arise, please consult RD.   Molli Barrows, RD, LDN, [place] Pager [phone] After Hours Pager [phone]

## 2016-07-03 NOTE — Progress Notes (Signed)
Patient ID: Katie Nunez, female   DOB: 03-09-1955, 61 y.o.   MRN: [id number]    PROGRESS NOTE  Katie Nunez  JOI:[id number] DOB: 12-27-1955 DOA: 06/25/2016  PCP: Katie Ada, MD    Brief Narrative:  Patient is 61 year old female with known scleroderma, rheumatoid arthritis, fibromyalgia, presented after being instructed by her nephrologist due to worsening kidney function tests. Per her nephrologist, creatinine in May 2018 was 4.7, new creatinine close to 8.  Assessment & Plan:   CKD5, AKI --> ESRD - had renal bx on 06/29/2016 - so far: ANA negative, MPO and PR3 ANCA negative , c-ANCA negative, p- ANCA mildly positive 1:80 - preliminary biopsy results with MEMRANOUS GN and collapsing variant FSGS with diffuse SEVERE TUBULOINTERSTITIAL SCARRING, full report pending, likely ESRD - pt is s/p HD x 3, this am will be 4th session, pt tolerating well so far  - s/p TDC and LAVF on 6/18, OP placement AF on TTS  History of rheumatoid arthritis and scleroderma - per nephrology, not scleroderma renal crisis - pt follows Dr. Boris Nunez rheumatologist at [facility]  HTN, essential - reasonable control for now   Epistaxis - 1 episode on 6/20, patient reports history of epistaxis and is seeing ENT - nasal packing as needed   Anemia of chronic disease - renal disease, scleroderma, rheumatoid arthritis  - no evidence of active bleeding   Thrombocytopenia, acute onset and low grade non immune hemolysis  - Sudden drop in platelets from 209 --> 64 --> 13 K on 6/21 - so far Coombs test - - LDH mildly elevated  -  HIT testing negative  - Plt slowly trending up   Hypokalemia - address with HD  GERD - stable   Obesity - Body mass index is 30.49 kg/m.  DVT prophylaxis: SCDs Code Status: Full Family Communication: pt at bedside  Disposition Plan: home once consulting teams clear  Consultants:   Nephrology  IR  Vascular surgery  Heme/Onc  Procedures:   Renal ultrasound  consistent with intrinsic renal disease  Renal biopsy done 06/28/2016  Graft placement for dialysis 06/28/2016  Right IJ hemodialysis cath by vascular surgery placed 06/28/2016  Antimicrobials:   None  Subjective: Pt reports no concerns this AM.   Objective: Vitals:   07/03/16 0945 07/03/16 1000 07/03/16 1030 07/03/16 1100  BP: 127/68 (!) 122/57 132/65 (!) 137/59  Pulse: 83 90 79 80  Resp:    16  Temp:    98.8 F (37.1 C)  TempSrc:    Oral  SpO2:    98%  Weight:      Height:        Intake/Output Summary (Last 24 hours) at 07/03/16 1435 Last data filed at 07/03/16 1412  Gross per 24 hour  Intake           982.47 ml  Output              600 ml  Net           382.47 ml   Filed Weights   07/01/16 2230 07/02/16 2228 07/03/16 0702  Weight: 81.6 kg (180 lb) 83.3 kg (183 lb 9.6 oz) 83.1 kg (183 lb 3.2 oz)    Physical Exam  Constitutional: Appears well-developed and well-nourished. No distress.  CVS: RRR, S1/S2 +, no murmurs, no gallops, no carotid bruit.  Pulmonary: Effort and breath sounds normal, no stridor, rhonchi, wheezes, rales.  Abdominal: Soft. BS +,  no distension, tenderness, rebound or guarding.   Data Reviewed:  I have personally reviewed following labs and imaging studies  CBC:  Recent Labs Lab 07/01/16 0415 07/01/16 1315 07/01/16 1545 07/01/16 2355 07/03/16 0424  WBC 6.1 6.5 8.9 7.1 5.5  NEUTROABS  --   --   --  5.0 3.4  HGB 8.6* 8.8* 8.7* 8.2* 8.1*  HCT 26.9* 27.9* 26.6* 25.3* 25.8*  MCV 87.9 87.2 87.8 88.2 89.9  PLT 64* 20* 13* 21* 36*   Basic Metabolic Panel:  Recent Labs Lab 06/29/16 0546 06/30/16 0432 07/01/16 0415 07/01/16 2355 07/03/16 0424  NA 140  139 139 136 135 137  K 4.5  4.5 3.9 4.0 3.3* 3.4*  CL 112*  111 107 102 99* 99*  CO2 18*  18* 23 25 26 26   GLUCOSE 95  95 92 109* 109* 93  BUN 92*  92* 59* 34* 19 49*  CREATININE 9.15*  9.16* 7.08* 6.36* 4.49* 7.27*  CALCIUM 9.3  9.4 8.8* 8.7* 8.3* 8.8*  PHOS 5.8* 4.8*  4.2 3.3 4.8*   Liver Function Tests:  Recent Labs Lab 06/29/16 0546 06/30/16 0432 07/01/16 0415 07/01/16 2355 07/03/16 0424  ALBUMIN 2.9* 2.7* 2.8* 2.8* 2.8*    Coagulation Profile:  Recent Labs Lab 07/01/16 1819  INR 1.12   Cardiac Enzymes:  Recent Labs Lab 06/28/16 0931  TROPONINI <0.03   Urine analysis:    Component Value Date/Time   COLORURINE YELLOW 06/25/2016 1745   APPEARANCEUR HAZY (A) 06/25/2016 1745   LABSPEC 1.009 06/25/2016 1745   PHURINE 5.0 06/25/2016 1745   GLUCOSEU NEGATIVE 06/25/2016 1745   HGBUR SMALL (A) 06/25/2016 1745   BILIRUBINUR NEGATIVE 06/25/2016 1745   KETONESUR NEGATIVE 06/25/2016 1745   PROTEINUR >=300 (A) 06/25/2016 1745   NITRITE NEGATIVE 06/25/2016 1745   LEUKOCYTESUR NEGATIVE 06/25/2016 1745   Recent Results (from the past 240 hour(s))  Surgical PCR screen     Status: None   Collection Time: 06/27/16  9:45 PM  Result Value Ref Range Status   MRSA, PCR NEGATIVE NEGATIVE Final   Staphylococcus aureus NEGATIVE NEGATIVE Final    Comment:        The Xpert SA Assay (FDA approved for NASAL specimens in patients over 27 years of age), is one component of a comprehensive surveillance program.  Test performance has been validated by [facility] for patients greater than or equal to 84 year old. It is not intended to diagnose infection nor to guide or monitor treatment.      Radiology Studies: No results found.  Scheduled Meds: . bisacodyl  10 mg Rectal Daily  . calcitRIOL  0.5 mcg Oral Daily  . cyclobenzaprine  5 mg Oral Once  . darbepoetin (ARANESP) injection - DIALYSIS  200 mcg Intravenous Q Sat-HD  . docusate sodium  200 mg Oral BID  . famotidine  20 mg Oral Daily  . feeding supplement (NEPRO CARB STEADY)  237 mL Oral BID BM  . feeding supplement (PRO-STAT SUGAR FREE 64)  30 mL Oral BID  . loratadine  10 mg Oral Daily  . metoprolol succinate  50 mg Oral QHS  . multivitamin  1 tablet Oral Daily  . pantoprazole  40  mg Oral Daily  . sodium chloride flush  3 mL Intravenous Q12H   Continuous Infusions: . sodium chloride 10 mL/hr at 06/28/16 1112  . sodium chloride    . sodium chloride    . sodium chloride    . ferric gluconate (FERRLECIT/NULECIT) IV Stopped (07/03/16 1241)    LOS: 8 days  Time spent: 25 minutes   Katie Ramsay, MD [facility] Pager [phone]  If 7PM-7AM, please contact night-coverage [email or web address] Password [facility] 07/03/2016, 2:35 PM

## 2016-07-04 DIAGNOSIS — F419 Anxiety disorder, unspecified: Secondary | ICD-10-CM

## 2016-07-04 LAB — RENAL FUNCTION PANEL
ALBUMIN: 2.7 g/dL — AB (ref 3.5–5.0)
Anion gap: 9 (ref 5–15)
BUN: 35 mg/dL — AB (ref 6–20)
CALCIUM: 8.7 mg/dL — AB (ref 8.9–10.3)
CO2: 27 mmol/L (ref 22–32)
Chloride: 102 mmol/L (ref 101–111)
Creatinine, Ser: 5.2 mg/dL — ABNORMAL HIGH (ref 0.44–1.00)
GFR calc Af Amer: 10 mL/min — ABNORMAL LOW (ref >60)
GFR calc non Af Amer: 8 mL/min — ABNORMAL LOW (ref >60)
GLUCOSE: 90 mg/dL (ref 65–99)
Phosphorus: 4 mg/dL (ref 2.5–4.6)
Potassium: 3.8 mmol/L (ref 3.5–5.1)
SODIUM: 138 mmol/L (ref 135–145)

## 2016-07-04 LAB — CBC WITH DIFFERENTIAL/PLATELET
Basophils Absolute: 0 10*3/uL (ref 0.0–0.1)
Basophils Relative: 0 %
EOS ABS: 0.1 10*3/uL (ref 0.0–0.7)
Eosinophils Relative: 1 %
HEMATOCRIT: 25.5 % — AB (ref 36.0–46.0)
HEMOGLOBIN: 7.9 g/dL — AB (ref 12.0–15.0)
LYMPHS ABS: 1.4 10*3/uL (ref 0.7–4.0)
Lymphocytes Relative: 27 %
MCH: 28.8 pg (ref 26.0–34.0)
MCHC: 31 g/dL (ref 30.0–36.0)
MCV: 93.1 fL (ref 78.0–100.0)
MONO ABS: 0.7 10*3/uL (ref 0.1–1.0)
MONOS PCT: 13 %
NEUTROS ABS: 2.9 10*3/uL (ref 1.7–7.7)
NEUTROS PCT: 58 %
Platelets: 21 10*3/uL — CL (ref 150–400)
RBC: 2.74 MIL/uL — ABNORMAL LOW (ref 3.87–5.11)
RDW: 16.9 % — AB (ref 11.5–15.5)
WBC: 4.9 10*3/uL (ref 4.0–10.5)

## 2016-07-04 LAB — RETICULOCYTES
RBC.: 2.74 MIL/uL — ABNORMAL LOW (ref 3.87–5.11)
Retic Count, Absolute: 219.2 10*3/uL — ABNORMAL HIGH (ref 19.0–186.0)
Retic Ct Pct: 8 % — ABNORMAL HIGH (ref 0.4–3.1)

## 2016-07-04 LAB — APTT: aPTT: 31 seconds (ref 24–36)

## 2016-07-04 MED ORDER — FLUCONAZOLE 100 MG PO TABS: 100.0000 mg | ORAL_TABLET | Freq: Every day | ORAL | Status: DC

## 2016-07-04 MED ORDER — FLUCONAZOLE 100 MG PO TABS
200.0000 mg | ORAL_TABLET | Freq: Every day | ORAL | Status: DC
  Administered 2016-07-04 – 2016-07-06 (×3): 200 mg via ORAL
  Filled 2016-07-04 (×3): qty 2

## 2016-07-04 MED ORDER — MAGIC MOUTHWASH
15.0000 mL | Freq: Four times a day (QID) | ORAL | Status: DC
  Administered 2016-07-04 – 2016-07-06 (×3): 15 mL via ORAL
  Filled 2016-07-04 (×4): qty 15

## 2016-07-04 NOTE — Progress Notes (Signed)
[facility] Progress Note   Subjective:  Feeling ok today, had leg cramping last night. Denies CP or dyspnea. Platelets back down again this morning to 21K, no bleeding. HD yest  Objective Vitals:   07/03/16 1100 07/03/16 1804 07/03/16 2237 07/04/16 0516  BP: (!) 137/59 (!) 110/56 123/69 (!) 133/56  Pulse: 80 80 77 82  Resp: 16 16 16 17   Temp: 98.8 F (37.1 C) 99 F (37.2 C) 99 F (37.2 C) 99.5 F (37.5 C)  TempSrc: Oral Oral Oral Oral  SpO2: 98% 98% 100% 95%  Weight:   83.5 kg (184 lb)   Height:   5\' 5"  (1.651 m)    Physical Exam General: Well appearing female, NAD Heart: RRR; no murmur Lungs: CTAB Extremities: No LE edema Dialysis Access: TDC in R chest, new LUE AVF + thrill/bruit  Additional Objective Labs: Basic Metabolic Panel:  Recent Labs Lab 07/01/16 2355 07/03/16 0424 07/04/16 0506  NA 135 137 138  K 3.3* 3.4* 3.8  CL 99* 99* 102  CO2 26 26 27   GLUCOSE 109* 93 90  BUN 19 49* 35*  CREATININE 4.49* 7.27* 5.20*  CALCIUM 8.3* 8.8* 8.7*  PHOS 3.3 4.8* 4.0   Liver Function Tests:  Recent Labs Lab 07/01/16 2355 07/03/16 0424 07/04/16 0506  ALBUMIN 2.8* 2.8* 2.7*   CBC:  Recent Labs Lab 07/01/16 1315 07/01/16 1545 07/01/16 2355 07/03/16 0424 07/04/16 0506  WBC 6.5 8.9 7.1 5.5 4.9  NEUTROABS  --   --  5.0 3.4 2.9  HGB 8.8* 8.7* 8.2* 8.1* 7.9*  HCT 27.9* 26.6* 25.3* 25.8* 25.5*  MCV 87.2 87.8 88.2 89.9 93.1  PLT 20* 13* 21* 36* 21*   Cardiac Enzymes:  Recent Labs Lab 06/28/16 0931  TROPONINI <0.03   Medications: . sodium chloride 10 mL/hr at 06/28/16 1112  . sodium chloride    . sodium chloride    . sodium chloride    . ferric gluconate (FERRLECIT/NULECIT) IV Stopped (07/03/16 1241)   . bisacodyl  10 mg Rectal Daily  . calcitRIOL  0.5 mcg Oral Daily  . cyclobenzaprine  5 mg Oral Once  . darbepoetin (ARANESP) injection - DIALYSIS  200 mcg Intravenous Q Sat-HD  . docusate sodium  200 mg Oral BID  . famotidine  20  mg Oral Daily  . feeding supplement (NEPRO CARB STEADY)  237 mL Oral BID BM  . feeding supplement (PRO-STAT SUGAR FREE 64)  30 mL Oral BID  . loratadine  10 mg Oral Daily  . metoprolol succinate  50 mg Oral QHS  . multivitamin  1 tablet Oral Daily  . pantoprazole  40 mg Oral Daily  . sodium chloride flush  3 mL Intravenous Q12H    Dialysis Orders:establishing new orders  Overview: 61 year old BF with progressive renal failure. Hx systemic sclerosis, ?rheumatoid arthritis, fibromyalgia, gout, HTN. Cr 1.16 in 08/2014, 1.69 12/2014, 1.58 05/2015, 2.2 09/2015, 3.62 01/2016, 4.71 05/28/16 -> 9 on admit 6/15.  Assessment/Plan: 1. CKD5/AKI -> now ESRD: all serolgies  Negative except p-ANCA mildly positive titer 1:80. Renal u/s 6/15  with "markedly increased echogenicity." Underwent kidney biopsy 6/19, prelim results showing "membranous GN and collapsing variant FSGS with diffuse severe tubulointerstitial scarring". Likely ESRD. - S/p HD x 3. Next HD sched for Tues 6/26; No heparin. - S/p TDC and L AVF on 6/18.  Has outpt chair at [facility] TTS 1st shift.   2. Thrombocytopenia: New as of 6/21. HIT assay negative; argatroban stopped. Platelets back  down some 3. HTN/volume: BP stable, on metoprolol 50mg  QHS only. 4. Anemia: Hgb 7.9. On Aranesp 200 mcg Q Wednesday (first dose 6/20), actually not given 06/26/16 although ordered. Tsat 20%; given feraheme 510 mg x 1 on 6/17, now getting course of Ferrelicit. 5. Secondary hyperparathyroidism: Corr Ca ok, Phos 4. On calcitriol 0. 71mcg daily; no binders yet. 6. Nutrition: Alb 2.7. Continue protein supps. 7. Scleroderma: followed by Dr. Boris Lown. 8. Dispo: has OP HD chair; pending plts.  Veneta Penton, PA-C 07/04/2016, 8:49 AM  [facility] Pager: [phone]  Patient seen and examined, agree with above note with above modifications. Looks good today- tolerated HD well without issue yest- really only awaiting platelets, need to make sure trending up  before discharge- next HD Tuesday  Corliss Parish, MD 07/04/2016

## 2016-07-04 NOTE — Progress Notes (Signed)
Patient ID: Katie Nunez, female   DOB: [birth year], 61 y.o.   MRN: [id number]    PROGRESS NOTE  Katie Nunez  HWE:[id number] DOB: 1955-09-01 DOA: 06/25/2016  PCP: [name], MD    Brief Narrative:  Patient is 61 year old female with known scleroderma, rheumatoid arthritis, fibromyalgia, presented after being instructed by her nephrologist due to worsening kidney function tests. Per her nephrologist, creatinine in May 2018 was 4.7, new creatinine close to 8.  Assessment & Plan:   CKD5, AKI --> ESRD - had renal bx on 06/29/2016 - so far: ANA negative, MPO and PR3 ANCA negative , c-ANCA negative, p- ANCA mildly positive 1:80 - preliminary biopsy results with MEMRANOUS GN and collapsing variant FSGS with diffuse SEVERE TUBULOINTERSTITIAL SCARRING, full report pending, likely ESRD - pt is s/p HD x 4, next HD scheduled for Tuesday  - s/p TDC and LAVF on 6/18, OP placement AF on TTS - pt so far tolerating well   History of rheumatoid arthritis and scleroderma - per nephrology, not scleroderma renal crisis - pt follows Dr. Boris Lown rheumatologist at [facility]  HTN, essential - reasonable control for now   Epistaxis - 1 episode on 6/20, patient reports history of epistaxis and is seeing ENT - nasal packing as needed   Post nasal drip with oral thrush - will place on magic mouthwash and diflucan to see if this will help   Anemia of chronic disease - renal disease, scleroderma, rheumatoid arthritis  - no evidence of active bleeding   Thrombocytopenia, acute onset and low grade non immune hemolysis  - Sudden drop in platelets from 209 --> 64 --> 13 K on 6/21 - so far Coombs test - - LDH mildly elevated  -  HIT testing negative  - plt down again this AM to 21K, will repeat in AM  Hypokalemia - address with HD  GERD - stable   Obesity - Body mass index is 30.62 kg/m.  DVT prophylaxis: SCDs Code Status: Full Family Communication: pt at bedside  Disposition Plan:  home once plt up   Consultants:   Nephrology  IR  Vascular surgery  Heme/Onc  Procedures:   Renal ultrasound consistent with intrinsic renal disease  Renal biopsy done 06/28/2016  Graft placement for dialysis 06/28/2016  Right IJ hemodialysis cath by vascular surgery placed 06/28/2016  Antimicrobials:   None  Subjective: Pt reports cramping yesterday in LE's but better now.   Objective: Vitals:   07/03/16 1804 07/03/16 2237 07/04/16 0516 07/04/16 0944  BP: (!) 110/56 123/69 (!) 133/56 121/65  Pulse: 80 77 82 80  Resp: 16 16 17 16   Temp: 99 F (37.2 C) 99 F (37.2 C) 99.5 F (37.5 C) 98.1 F (36.7 C)  TempSrc: Oral Oral Oral Oral  SpO2: 98% 100% 95% 98%  Weight:  83.5 kg (184 lb)    Height:  5\' 5"  (1.651 m)      Intake/Output Summary (Last 24 hours) at 07/04/16 1446 Last data filed at 07/04/16 0900  Gross per 24 hour  Intake              600 ml  Output              650 ml  Net              -50 ml   Filed Weights   07/02/16 2228 07/03/16 0702 07/03/16 2237  Weight: 83.3 kg (183 lb 9.6 oz) 83.1 kg (183 lb 3.2 oz) 83.5 kg (184 lb)  Physical Exam  Constitutional: Appears well-developed and well-nourished. No distress.  CVS: RRR, S1/S2 +, no murmurs, no gallops, no carotid bruit.  Pulmonary: Effort and breath sounds normal, no stridor, rhonchi, wheezes, rales.  Abdominal: Soft. BS +,  no distension, tenderness, rebound or guarding.  Musculoskeletal: Normal range of motion. No edema and no tenderness.  Lymphadenopathy: No lymphadenopathy noted, cervical, inguinal. Neuro: Alert. Normal reflexes, muscle tone coordination. No cranial nerve deficit. Skin: Skin is warm and dry. No rash noted. Not diaphoretic. No erythema. No pallor.  Psychiatric: Normal mood and affect. Behavior, judgment, thought content normal.   Data Reviewed: I have personally reviewed following labs and imaging studies  CBC:  Recent Labs Lab 07/01/16 1315 07/01/16 1545  07/01/16 2355 07/03/16 0424 07/04/16 0506  WBC 6.5 8.9 7.1 5.5 4.9  NEUTROABS  --   --  5.0 3.4 2.9  HGB 8.8* 8.7* 8.2* 8.1* 7.9*  HCT 27.9* 26.6* 25.3* 25.8* 25.5*  MCV 87.2 87.8 88.2 89.9 93.1  PLT 20* 13* 21* 36* 21*   Basic Metabolic Panel:  Recent Labs Lab 06/30/16 0432 07/01/16 0415 07/01/16 2355 07/03/16 0424 07/04/16 0506  NA 139 136 135 137 138  K 3.9 4.0 3.3* 3.4* 3.8  CL 107 102 99* 99* 102  CO2 23 25 26 26 27   GLUCOSE 92 109* 109* 93 90  BUN 59* 34* 19 49* 35*  CREATININE 7.08* 6.36* 4.49* 7.27* 5.20*  CALCIUM 8.8* 8.7* 8.3* 8.8* 8.7*  PHOS 4.8* 4.2 3.3 4.8* 4.0   Liver Function Tests:  Recent Labs Lab 06/30/16 0432 07/01/16 0415 07/01/16 2355 07/03/16 0424 07/04/16 0506  ALBUMIN 2.7* 2.8* 2.8* 2.8* 2.7*    Coagulation Profile:  Recent Labs Lab 07/01/16 1819  INR 1.12   Cardiac Enzymes:  Recent Labs Lab 06/28/16 0931  TROPONINI <0.03   Urine analysis:    Component Value Date/Time   COLORURINE YELLOW 06/25/2016 1745   APPEARANCEUR HAZY (A) 06/25/2016 1745   LABSPEC 1.009 06/25/2016 1745   PHURINE 5.0 06/25/2016 1745   GLUCOSEU NEGATIVE 06/25/2016 1745   HGBUR SMALL (A) 06/25/2016 1745   BILIRUBINUR NEGATIVE 06/25/2016 1745   KETONESUR NEGATIVE 06/25/2016 1745   PROTEINUR >=300 (A) 06/25/2016 1745   NITRITE NEGATIVE 06/25/2016 1745   LEUKOCYTESUR NEGATIVE 06/25/2016 1745   Recent Results (from the past 240 hour(s))  Surgical PCR screen     Status: None   Collection Time: 06/27/16  9:45 PM  Result Value Ref Range Status   MRSA, PCR NEGATIVE NEGATIVE Final   Staphylococcus aureus NEGATIVE NEGATIVE Final    Comment:        The Xpert SA Assay (FDA approved for NASAL specimens in patients over 42 years of age), is one component of a comprehensive surveillance program.  Test performance has been validated by [facility] for patients greater than or equal to 59 year old. It is not intended to diagnose infection nor to guide or  monitor treatment.      Radiology Studies: No results found.  Scheduled Meds: . bisacodyl  10 mg Rectal Daily  . calcitRIOL  0.5 mcg Oral Daily  . cyclobenzaprine  5 mg Oral Once  . darbepoetin (ARANESP) injection - DIALYSIS  200 mcg Intravenous Q Sat-HD  . docusate sodium  200 mg Oral BID  . famotidine  20 mg Oral Daily  . feeding supplement (NEPRO CARB STEADY)  237 mL Oral BID BM  . feeding supplement (PRO-STAT SUGAR FREE 64)  30 mL Oral BID  .  fluconazole  200 mg Oral Daily  . loratadine  10 mg Oral Daily  . magic mouthwash  15 mL Oral QID  . metoprolol succinate  50 mg Oral QHS  . multivitamin  1 tablet Oral Daily  . pantoprazole  40 mg Oral Daily  . sodium chloride flush  3 mL Intravenous Q12H   Continuous Infusions: . sodium chloride 10 mL/hr at 06/28/16 1112  . sodium chloride    . sodium chloride    . sodium chloride    . ferric gluconate (FERRLECIT/NULECIT) IV Stopped (07/03/16 1241)    LOS: 9 days   Time spent: 25 minutes  Faye Ramsay, MD [facility] Pager [phone]  If 7PM-7AM, please contact night-coverage [email or web address] Password [facility] 07/04/2016, 2:46 PM

## 2016-07-04 NOTE — Progress Notes (Signed)
CRITICAL VALUE ALERT  Critical value received: PLT count 21 Date of notification: 07/04/16 Time of notification: 0650 Critical value read back: Yes Nurse who received alert: Candise Bowens, RN MD notified (1st page):  Dr. Myna Hidalgo

## 2016-07-05 DIAGNOSIS — M069 Rheumatoid arthritis, unspecified: Secondary | ICD-10-CM

## 2016-07-05 DIAGNOSIS — N178 Other acute kidney failure: Secondary | ICD-10-CM

## 2016-07-05 LAB — CBC WITH DIFFERENTIAL/PLATELET
BASOS ABS: 0 10*3/uL (ref 0.0–0.1)
BASOS PCT: 0 %
Eosinophils Absolute: 0.1 10*3/uL (ref 0.0–0.7)
Eosinophils Relative: 1 %
HEMATOCRIT: 25.4 % — AB (ref 36.0–46.0)
Hemoglobin: 7.8 g/dL — ABNORMAL LOW (ref 12.0–15.0)
Lymphocytes Relative: 27 %
Lymphs Abs: 1.4 10*3/uL (ref 0.7–4.0)
MCH: 28.5 pg (ref 26.0–34.0)
MCHC: 30.7 g/dL (ref 30.0–36.0)
MCV: 92.7 fL (ref 78.0–100.0)
MONOS PCT: 11 %
Monocytes Absolute: 0.6 10*3/uL (ref 0.1–1.0)
NEUTROS ABS: 3 10*3/uL (ref 1.7–7.7)
NEUTROS PCT: 60 %
Platelets: 25 10*3/uL — CL (ref 150–400)
RBC: 2.74 MIL/uL — ABNORMAL LOW (ref 3.87–5.11)
RDW: 17.7 % — ABNORMAL HIGH (ref 11.5–15.5)
WBC: 5 10*3/uL (ref 4.0–10.5)

## 2016-07-05 LAB — RENAL FUNCTION PANEL
ANION GAP: 12 (ref 5–15)
Albumin: 2.7 g/dL — ABNORMAL LOW (ref 3.5–5.0)
BUN: 52 mg/dL — ABNORMAL HIGH (ref 6–20)
CALCIUM: 8.9 mg/dL (ref 8.9–10.3)
CHLORIDE: 103 mmol/L (ref 101–111)
CO2: 25 mmol/L (ref 22–32)
Creatinine, Ser: 7.13 mg/dL — ABNORMAL HIGH (ref 0.44–1.00)
GFR calc non Af Amer: 6 mL/min — ABNORMAL LOW (ref >60)
GFR, EST AFRICAN AMERICAN: 7 mL/min — AB (ref >60)
Glucose, Bld: 92 mg/dL (ref 65–99)
Phosphorus: 4.6 mg/dL (ref 2.5–4.6)
Potassium: 3.8 mmol/L (ref 3.5–5.1)
SODIUM: 140 mmol/L (ref 135–145)

## 2016-07-05 LAB — RETICULOCYTES
RBC.: 2.74 MIL/uL — AB (ref 3.87–5.11)
RETIC COUNT ABSOLUTE: 230.2 10*3/uL — AB (ref 19.0–186.0)
Retic Ct Pct: 8.4 % — ABNORMAL HIGH (ref 0.4–3.1)

## 2016-07-05 NOTE — Progress Notes (Addendum)
[facility] Progress Note   Subjective: Seen walking in halls. Looks great. Fully dressed. Says nepro is giving her diarrhea. Will DC.   Objective       Vitals:   07/04/16 0944 07/04/16 1733 07/05/16 0607 07/05/16 0936  BP: 121/65 140/72 133/64 123/63  Pulse: 80 80 72 70  Resp: 16 16 16 18   Temp: 98.1 F (36.7 C) 98 F (36.7 C) 98.8 F (37.1 C) 98.8 F (37.1 C)  TempSrc: Oral Oral Oral Oral  SpO2: 98% 99% 97% 98%  Weight:      Height:       Physical Exam General: Pleasant, NAD Heart: S1,S2, RRR No JVD.  Lungs:CTAB A/P Abdomen: Active BS Extremities: No LE edema Dialysis Access: [facility]. LUE AVF + bruit    Additional Objective Labs: Basic Metabolic Panel:  Last Labs    Recent Labs Lab 07/03/16 0424 07/04/16 0506 07/05/16 0510  NA 137 138 140  K 3.4* 3.8 3.8  CL 99* 102 103  CO2 26 27 25   GLUCOSE 93 90 92  BUN 49* 35* 52*  CREATININE 7.27* 5.20* 7.13*  CALCIUM 8.8* 8.7* 8.9  PHOS 4.8* 4.0 4.6     Liver Function Tests:  Last Labs    Recent Labs Lab 07/03/16 0424 07/04/16 0506 07/05/16 0510  ALBUMIN 2.8* 2.7* 2.7*     Last Labs   No results for input(s): LIPASE, AMYLASE in the last 168 hours.   CBC:  Last Labs    Recent Labs Lab 07/01/16 1545  07/01/16 2355 07/03/16 0424 07/04/16 0506 07/05/16 0510  WBC 8.9  --  7.1 5.5 4.9 5.0  NEUTROABS  --   < > 5.0 3.4 2.9 3.0  HGB 8.7*  --  8.2* 8.1* 7.9* 7.8*  HCT 26.6*  --  25.3* 25.8* 25.5* 25.4*  MCV 87.8  --  88.2 89.9 93.1 92.7  PLT 13*  --  21* 36* 21* 25*  < > = values in this interval not displayed.   Blood Culture Labs (Brief)  No results found for: SDES, SPECREQUEST, CULT, REPTSTATUS    Cardiac Enzymes: Last Labs   No results for input(s): CKTOTAL, CKMB, CKMBINDEX, TROPONINI in the last 168 hours.   CBG: Last Labs   No results for input(s): GLUCAP in the last 168 hours.   Iron Studies:  Recent Labs  (last 2 labs)   No results for input(s): IRON, TIBC, TRANSFERRIN, FERRITIN in the last 72 hours.   @lablastinr3 @ Studies/Results: Imaging Results (Last 48 hours)  No results found.   Medications: . sodium chloride 10 mL/hr at 06/28/16 1112  . sodium chloride    . sodium chloride    . sodium chloride    . ferric gluconate (FERRLECIT/NULECIT) IV Stopped (07/03/16 1241)   . bisacodyl  10 mg Rectal Daily  . calcitRIOL  0.5 mcg Oral Daily  . cyclobenzaprine  5 mg Oral Once  . darbepoetin (ARANESP) injection - DIALYSIS  200 mcg Intravenous Q Sat-HD  . docusate sodium  200 mg Oral BID  . famotidine  20 mg Oral Daily  . feeding supplement (NEPRO CARB STEADY)  237 mL Oral BID BM  . feeding supplement (PRO-STAT SUGAR FREE 64)  30 mL Oral BID  . fluconazole  200 mg Oral Daily  . loratadine  10 mg Oral Daily  . magic mouthwash  15 mL Oral  QID  . metoprolol succinate  50 mg Oral QHS  . multivitamin  1 tablet Oral Daily  . pantoprazole  40 mg Oral Daily  . sodium chloride flush  3 mL Intravenous Q12H     Overview: 61 year old BF with progressive renal failure. Hx systemic sclerosis, ?rheumatoid arthritis, fibromyalgia, gout, HTN. Cr 1.16 in 08/2014, 1.69 12/2014, 1.58 05/2015, 2.2 09/2015, 3.62 01/2016, 4.71 05/28/16 -> 9 on admit 6/15.  Assessment/Plan: 1.CKD5/AKI ->now ESRD: all serolgies Negative exceptp-ANCA mildly positive titer 1:80. Renal u/s 6/15 with "markedly increased echogenicity."Underwent kidney biopsy 6/19, prelim results showing "membranous GN and collapsing variant FSGS with diffuse severe tubulointerstitial scarring". Likely ESRD. - S/p HD x 3. Next HD sched for Tues 6/26; No heparin. - S/p TDC and L AVF on 6/18. Has outpt chair at [facility] TTS 1st shift.  2. Thrombocytopenia: New as of 6/21. HIT assay negative; argatroban stopped. Platelets 25.  3.HTN/volume: BP stable, on metoprolol 50mg  QHS only. Run even. EDW not established yet.   4. Anemia: Hgb 7.8 On Aranesp  200 mcg Q Wednesday (first dose 6/20), actually not given 06/26/16 although ordered. Tsat 20%; given feraheme 510 mg x 1 on 6/17, now getting course of Ferrelicit. 5. Secondary hyperparathyroidism:Corr Ca ok, Phos 4. On calcitriol 0. 46mcg daily; no binders yet. 6. Nutrition: Alb 2.7. Cont prostat-stop nepro-giving her diarrhea.  7. Scleroderma: followed by Dr. Boris Lown. 8. Dispo: has OP HD chair; pending plts.  Rita H. Brown NP-C 07/05/2016, 12:06 PM  [facility] [phone]  Pt seen, examined and agree w A/P as above.  Kelly Splinter MD [employer] pager [phone]   07/05/2016, 2:24 PM

## 2016-07-05 NOTE — Progress Notes (Signed)
Patient ID: Katie Nunez, female   DOB: 28-Apr-1955, 61 y.o.   MRN: [id number]    PROGRESS NOTE  Katie Nunez  KGU:[id number] DOB: [birth year] DOA: 06/25/2016  PCP: Katie Ada, MD    Brief Narrative:  Patient is 61 year old female with known scleroderma, rheumatoid arthritis, fibromyalgia, presented after being instructed by her nephrologist due to worsening kidney function tests. Per her nephrologist, creatinine in May 2018 was 4.7, new creatinine close to 8.  Assessment & Plan:   CKD5, AKI --> ESRD - had renal bx on 06/29/2016 - so far: ANA negative, MPO and PR3 ANCA negative , c-ANCA negative, p- ANCA mildly positive 1:80 - preliminary biopsy results with MEMRANOUS GN and collapsing variant FSGS with diffuse SEVERE TUBULOINTERSTITIAL SCARRING, likely ESRD - pt is s/p HD x 4, next HD scheduled for Tuesday  - s/p TDC and LAVF on 6/18, OP placement AF on TTS - pt tolerating HD well  History of rheumatoid arthritis and scleroderma - per nephrology, not scleroderma renal crisis - pt follows Dr. Boris Nunez rheumatologist at [facility]  HTN, essential - reasonable control for now   Epistaxis - 1 episode on 6/20, patient reports history of epistaxis and is seeing ENT - nasal packing as needed   Post nasal drip with oral thrush - pt still congested, thrush much better - diflucan day #2  Anemia of chronic disease - renal disease, scleroderma, rheumatoid arthritis  - no active bleeding   Thrombocytopenia, acute onset and low grade non immune hemolysis  - Sudden drop in platelets from 209 --> 64 --> 13 K on 6/21 - so far Coombs test - - LDH mildly elevated  -  HIT testing negative  - Plt up this AM 25 K, will monitor for one more day   Hypokalemia - address with HD  GERD - stable   Obesity - Body mass index is 30.62 kg/m.  DVT prophylaxis: SCDs Code Status: Full Family Communication: pt at bedside  Disposition Plan: home once Plt more stable   Consultants:    Nephrology  IR  Vascular surgery  Heme/Onc  Procedures:   Renal ultrasound consistent with intrinsic renal disease  Renal biopsy done 06/28/2016  Graft placement for dialysis 06/28/2016  Right IJ hemodialysis cath by vascular surgery placed 06/28/2016  Antimicrobials:   None  Subjective: Pt reports some weakness in proximal thighs but says overall better.   Objective: Vitals:   07/04/16 0944 07/04/16 1733 07/05/16 0607 07/05/16 0936  BP: 121/65 140/72 133/64 123/63  Pulse: 80 80 72 70  Resp: 16 16 16 18   Temp: 98.1 F (36.7 C) 98 F (36.7 C) 98.8 F (37.1 C) 98.8 F (37.1 C)  TempSrc: Oral Oral Oral Oral  SpO2: 98% 99% 97% 98%  Weight:      Height:        Intake/Output Summary (Last 24 hours) at 07/05/16 1146 Last data filed at 07/05/16 1050  Gross per 24 hour  Intake              963 ml  Output             1200 ml  Net             -237 ml   Filed Weights   07/02/16 2228 07/03/16 0702 07/03/16 2237  Weight: 83.3 kg (183 lb 9.6 oz) 83.1 kg (183 lb 3.2 oz) 83.5 kg (184 lb)   Physical Exam  Constitutional: Appears well-developed and well-nourished. No distress.  CVS: RRR, S1/S2 +,  no murmurs, no gallops, no carotid bruit.  Pulmonary: Effort and breath sounds normal, no stridor, rhonchi, wheezes, rales.  Abdominal: Soft. BS +,  no distension, tenderness, rebound or guarding.  Musculoskeletal: Normal range of motion. No edema and no tenderness.  Psychiatric: Normal mood and affect. Behavior, judgment, thought content normal.   Data Reviewed: I have personally reviewed following labs and imaging studies  CBC:  Recent Labs Lab 07/01/16 1545 07/01/16 2355 07/03/16 0424 07/04/16 0506 07/05/16 0510  WBC 8.9 7.1 5.5 4.9 5.0  NEUTROABS  --  5.0 3.4 2.9 3.0  HGB 8.7* 8.2* 8.1* 7.9* 7.8*  HCT 26.6* 25.3* 25.8* 25.5* 25.4*  MCV 87.8 88.2 89.9 93.1 92.7  PLT 13* 21* 36* 21* 25*   Basic Metabolic Panel:  Recent Labs Lab 07/01/16 0415  07/01/16 2355 07/03/16 0424 07/04/16 0506 07/05/16 0510  NA 136 135 137 138 140  K 4.0 3.3* 3.4* 3.8 3.8  CL 102 99* 99* 102 103  CO2 25 26 26 27 25   GLUCOSE 109* 109* 93 90 92  BUN 34* 19 49* 35* 52*  CREATININE 6.36* 4.49* 7.27* 5.20* 7.13*  CALCIUM 8.7* 8.3* 8.8* 8.7* 8.9  PHOS 4.2 3.3 4.8* 4.0 4.6   Liver Function Tests:  Recent Labs Lab 07/01/16 0415 07/01/16 2355 07/03/16 0424 07/04/16 0506 07/05/16 0510  ALBUMIN 2.8* 2.8* 2.8* 2.7* 2.7*    Coagulation Profile:  Recent Labs Lab 07/01/16 1819  INR 1.12   Urine analysis:    Component Value Date/Time   COLORURINE YELLOW 06/25/2016 1745   APPEARANCEUR HAZY (A) 06/25/2016 1745   LABSPEC 1.009 06/25/2016 1745   PHURINE 5.0 06/25/2016 1745   GLUCOSEU NEGATIVE 06/25/2016 1745   HGBUR SMALL (A) 06/25/2016 1745   BILIRUBINUR NEGATIVE 06/25/2016 1745   KETONESUR NEGATIVE 06/25/2016 1745   PROTEINUR >=300 (A) 06/25/2016 1745   NITRITE NEGATIVE 06/25/2016 1745   LEUKOCYTESUR NEGATIVE 06/25/2016 1745   Recent Results (from the past 240 hour(s))  Surgical PCR screen     Status: None   Collection Time: 06/27/16  9:45 PM  Result Value Ref Range Status   MRSA, PCR NEGATIVE NEGATIVE Final   Staphylococcus aureus NEGATIVE NEGATIVE Final    Comment:        The Xpert SA Assay (FDA approved for NASAL specimens in patients over 19 years of age), is one component of a comprehensive surveillance program.  Test performance has been validated by [facility] for patients greater than or equal to 3 year old. It is not intended to diagnose infection nor to guide or monitor treatment.      Radiology Studies: No results found.  Scheduled Meds: . bisacodyl  10 mg Rectal Daily  . calcitRIOL  0.5 mcg Oral Daily  . cyclobenzaprine  5 mg Oral Once  . darbepoetin (ARANESP) injection - DIALYSIS  200 mcg Intravenous Q Sat-HD  . docusate sodium  200 mg Oral BID  . famotidine  20 mg Oral Daily  . feeding supplement (NEPRO  CARB STEADY)  237 mL Oral BID BM  . feeding supplement (PRO-STAT SUGAR FREE 64)  30 mL Oral BID  . fluconazole  200 mg Oral Daily  . loratadine  10 mg Oral Daily  . magic mouthwash  15 mL Oral QID  . metoprolol succinate  50 mg Oral QHS  . multivitamin  1 tablet Oral Daily  . pantoprazole  40 mg Oral Daily  . sodium chloride flush  3 mL Intravenous Q12H   Continuous Infusions: .  sodium chloride 10 mL/hr at 06/28/16 1112  . sodium chloride    . sodium chloride    . sodium chloride    . ferric gluconate (FERRLECIT/NULECIT) IV Stopped (07/03/16 1241)    LOS: 10 days   Time spent: 25 minutes  Faye Ramsay, MD [facility] Pager [phone]  If 7PM-7AM, please contact night-coverage [email or web address] Password [facility] 07/05/2016, 11:46 AM

## 2016-07-05 NOTE — Progress Notes (Signed)
Hematology follow-up note: I reviewed the peripheral blood film from today's lab.  There are now approximately 1-2 schistocytes per high-power field and the presence of micro-spherocytes.  Persistent polychromasia.  Reticulocyte count 8.4%. I am now concerned with the possibility that we may be dealing with atypical hemolytic uremic syndrome. I will discuss with nephrology. I have ordered an ADAM-TS enzyme analysis and a follow-up LDH for the morning. Impression: Possible atypical hemolytic uremic syndrome Recommendation: After labs drawn in the morning, I would vaccinate her against meningitis, pneumococcus, and Haemophilus influenzae in anticipation of possible use of Eculizumab (Soliris) anti-C5 A complement antibody.  This drug has shown the potential to prevent and in some cases reverse renal damage and some patients have been able to get off dialysis. I have notified nephrology on call and the attending hospitalist.  If this is atypical hemolytic uremic syndrome, this is a medical emergency.  The patient should not be discharged and the first dose of the Soliris should be given under direct hospital observation.   Murriel Hopper, MD, [place]  Hematology-Oncology/Internal Medicine [phone]

## 2016-07-05 NOTE — Progress Notes (Addendum)
Hematology: Fluctuating platelet count over the weekend.  Initial rise to 36,000, then fall to 21,000, and up to 25,000 today.  I have asked the lab to make another blood film for my review.  Initial review unremarkable except for decreased platelets. Preliminary pathology on the kidney biopsy from last week as stated in Dr. Shelva Majestic note: Membranous glomerulonephritis with advanced changes portending end-stage renal disease. I do not think her low platelets are directly related to her renal pathology.  No evidence for a microangiopathic process on smear.  White count and differential remain normal.  Hemoglobin relatively stable with only minor fall and anemia overall commensurate with her degree of renal failure.  Aranesp initiated. I do not think that a bone marrow biopsy will add any insight into the current problem.  This is still most likely medication related.  I would continue to avoid all potential bone marrow suppressing drugs and heparin products until platelet count has recovered.  Check CBC at least weekly for now.  Avoid cephalosporin antibiotics.  Although this was not clearly the offending drug, she received minimal other medications. I discussed all of the above with the patient.  I will be happy to see her in my clinic as an outpatient as needed.  My appointment person is Ms. Susette Racer [phone].  I will be out of town next week.  Impression: Idiopathic thrombocytopenia likely medication related Recommendation: See above

## 2016-07-05 NOTE — Progress Notes (Addendum)
Palpable mass on left upper hip. Site marked

## 2016-07-06 LAB — RENAL FUNCTION PANEL
ANION GAP: 12 (ref 5–15)
Albumin: 2.8 g/dL — ABNORMAL LOW (ref 3.5–5.0)
BUN: 65 mg/dL — AB (ref 6–20)
CALCIUM: 8.8 mg/dL — AB (ref 8.9–10.3)
CO2: 23 mmol/L (ref 22–32)
CREATININE: 8.45 mg/dL — AB (ref 0.44–1.00)
Chloride: 103 mmol/L (ref 101–111)
GFR calc Af Amer: 5 mL/min — ABNORMAL LOW (ref >60)
GFR calc non Af Amer: 5 mL/min — ABNORMAL LOW (ref >60)
GLUCOSE: 90 mg/dL (ref 65–99)
Phosphorus: 5.1 mg/dL — ABNORMAL HIGH (ref 2.5–4.6)
Potassium: 3.5 mmol/L (ref 3.5–5.1)
SODIUM: 138 mmol/L (ref 135–145)

## 2016-07-06 LAB — CBC WITH DIFFERENTIAL/PLATELET
BASOS PCT: 0 %
Basophils Absolute: 0 10*3/uL (ref 0.0–0.1)
EOS PCT: 1 %
Eosinophils Absolute: 0 10*3/uL (ref 0.0–0.7)
HEMATOCRIT: 26.5 % — AB (ref 36.0–46.0)
Hemoglobin: 8 g/dL — ABNORMAL LOW (ref 12.0–15.0)
Lymphocytes Relative: 24 %
Lymphs Abs: 1.4 10*3/uL (ref 0.7–4.0)
MCH: 28.6 pg (ref 26.0–34.0)
MCHC: 30.2 g/dL (ref 30.0–36.0)
MCV: 94.6 fL (ref 78.0–100.0)
MONO ABS: 0.4 10*3/uL (ref 0.1–1.0)
MONOS PCT: 7 %
Neutro Abs: 4 10*3/uL (ref 1.7–7.7)
Neutrophils Relative %: 68 %
Platelets: 36 10*3/uL — ABNORMAL LOW (ref 150–400)
RBC: 2.8 MIL/uL — ABNORMAL LOW (ref 3.87–5.11)
RDW: 18.6 % — AB (ref 11.5–15.5)
WBC: 6 10*3/uL (ref 4.0–10.5)

## 2016-07-06 LAB — SAVE SMEAR

## 2016-07-06 LAB — RETICULOCYTES
RBC.: 2.8 MIL/uL — AB (ref 3.87–5.11)
Retic Count, Absolute: 240.8 10*3/uL — ABNORMAL HIGH (ref 19.0–186.0)
Retic Ct Pct: 8.6 % — ABNORMAL HIGH (ref 0.4–3.1)

## 2016-07-06 LAB — LACTATE DEHYDROGENASE: LDH: 196 U/L — ABNORMAL HIGH (ref 98–192)

## 2016-07-06 MED ORDER — MENINGOCOCCAL VAC B (OMV) IM SUSY
0.5000 mL | PREFILLED_SYRINGE | Freq: Once | INTRAMUSCULAR | Status: DC
  Filled 2016-07-06: qty 0.5

## 2016-07-06 MED ORDER — ANTICOAGULANT SODIUM CITRATE 4% (200MG/5ML) IV SOLN
5.0000 mL | Status: AC
  Administered 2016-07-06: 5 mL via INTRAVENOUS
  Filled 2016-07-06 (×2): qty 250

## 2016-07-06 MED ORDER — CALCITRIOL 0.5 MCG PO CAPS
ORAL_CAPSULE | ORAL | Status: AC
  Administered 2016-07-06: 0.5 ug via ORAL
  Filled 2016-07-06: qty 1

## 2016-07-06 MED ORDER — HAEMOPHILUS B POLYSAC CONJ VAC IM SOLR
0.5000 mL | Freq: Once | INTRAMUSCULAR | Status: AC
  Administered 2016-07-06: 0.5 mL via INTRAMUSCULAR
  Filled 2016-07-06 (×2): qty 0.5

## 2016-07-06 MED ORDER — MAGIC MOUTHWASH: 15.0000 mL | Freq: Four times a day (QID) | ORAL | Status: DC | PRN

## 2016-07-06 MED ORDER — PNEUMOCOCCAL VAC POLYVALENT 25 MCG/0.5ML IJ INJ: 0.5000 mL | INJECTION | INTRAMUSCULAR | Status: DC

## 2016-07-06 MED ORDER — PNEUMOCOCCAL 13-VAL CONJ VACC IM SUSP
0.5000 mL | INTRAMUSCULAR | Status: AC
  Administered 2016-07-07: 0.5 mL via INTRAMUSCULAR
  Filled 2016-07-06 (×2): qty 0.5

## 2016-07-06 MED ORDER — MENINGOCOCCAL A C Y&W-135 OLIG IM SOLR
0.5000 mL | Freq: Once | INTRAMUSCULAR | Status: DC
  Filled 2016-07-06: qty 0.5

## 2016-07-06 NOTE — Progress Notes (Signed)
Patient ID: Katie Nunez, female   DOB: [birth year], 61 y.o.   MRN: [id number]    PROGRESS NOTE  Katie Nunez  [name] DOB: 1955-08-15 DOA: 06/25/2016  PCP: Katie Ada, MD    Brief Narrative:  Patient is 61 year old female with known scleroderma, rheumatoid arthritis, fibromyalgia, presented after being instructed by her nephrologist due to worsening kidney function tests. Per her nephrologist, creatinine in May 2018 was 4.7, new creatinine close to 8.  Assessment & Plan:   CKD5, AKI --> ESRD - had renal bx on 06/29/2016 - so far: ANA negative, MPO and PR3 ANCA negative , c-ANCA negative, p- ANCA mildly positive 1:80 - preliminary biopsy results with MEMRANOUS GN and collapsing variant FSGS with diffuse SEVERE TUBULOINTERSTITIAL SCARRING, likely ESRD - pt is s/p HD x 5, tolerating HD well this AM  - s/p TDC and LAVF on 6/18, OP placement AF on TTS  History of rheumatoid arthritis and scleroderma - per nephrology, not scleroderma renal crisis - pt follows Dr. Boris Nunez rheumatologist at [facility]  HTN, essential - reasonable control for now   Epistaxis - 1 episode on 6/20, patient reports history of epistaxis and is seeing ENT - nasal packing as needed   Post nasal drip with oral thrush - pt still congested, trush much better - diflucan day #3  Anemia of chronic disease - renal disease, scleroderma, rheumatoid arthritis  - no active bleeding   Left buttock area bump - with small redness - non tender - place warm compresses on it to see if it will help  - pt wants to wait on further scanning for now  Thrombocytopenia, acute onset and low grade non immune hemolysis  - Sudden drop in platelets from 209 --> 64 --> 13 K on 6/21 - so far Coombs test - - LDH mildly elevated  -  HIT testing negative  - smear reviewed yesterday, schistocytes,  - recommendation is to vaccinate her against meningitis, pneumococcus, and Haemophilus influenzae in anticipation of  possible use of Eculizumab (Soliris) anti-C5 A complement antibody  Hypokalemia - address with HD  GERD - stable   Obesity - Body mass index is 30.56 kg/m.  DVT prophylaxis: SCDs Code Status: Full Family Communication: pt at bedside  Disposition Plan: home once Plt more stable   Consultants:   Nephrology  IR  Vascular surgery  Heme/Onc  Procedures:   Renal ultrasound consistent with intrinsic renal disease  Renal biopsy done 06/28/2016  Graft placement for dialysis 06/28/2016  Right IJ hemodialysis cath by vascular surgery placed 06/28/2016  Antimicrobials:   None  Subjective: Pt reports small bump on left glut area.   Objective: Vitals:   07/06/16 1030 07/06/16 1100 07/06/16 1115 07/06/16 1159  BP: 140/77 123/66 133/71 128/64  Pulse: 77 81 77 82  Resp:   18 18  Temp:   98.2 F (36.8 C) 98.7 F (37.1 C)  TempSrc:   Oral Oral  SpO2:    91%  Weight:      Height:        Intake/Output Summary (Last 24 hours) at 07/06/16 1535 Last data filed at 07/06/16 1433  Gross per 24 hour  Intake              843 ml  Output             1200 ml  Net             -357 ml   Filed Weights   07/03/16 2237 07/05/16  2026 07/06/16 0700  Weight: 83.5 kg (184 lb) 83.4 kg (183 lb 13.8 oz) 83.3 kg (183 lb 10.3 oz)   Physical Exam  Constitutional: Appears well-developed and well-nourished. No distress.  CVS: RRR, S1/S2 +, no murmurs, no gallops, no carotid bruit.  Pulmonary: Effort and breath sounds normal, no stridor, rhonchi, wheezes, rales.  Abdominal: Soft. BS +,  no distension, tenderness, rebound or guarding.  Musculoskeletal: Normal range of motion. No edema and no tenderness.  Lymphadenopathy: No lymphadenopathy noted, cervical, inguinal. Neuro: Alert. Normal reflexes, muscle tone coordination. No cranial nerve deficit. Skin: Small bump on left glu, small area of redness 1 cm in diameter, non tender to palpation  Psychiatric: Normal mood and affect. Behavior,  judgment, thought content normal.    Data Reviewed: I have personally reviewed following labs and imaging studies  CBC:  Recent Labs Lab 07/01/16 2355 07/03/16 0424 07/04/16 0506 07/05/16 0510 07/06/16 0418  WBC 7.1 5.5 4.9 5.0 6.0  NEUTROABS 5.0 3.4 2.9 3.0 4.0  HGB 8.2* 8.1* 7.9* 7.8* 8.0*  HCT 25.3* 25.8* 25.5* 25.4* 26.5*  MCV 88.2 89.9 93.1 92.7 94.6  PLT 21* 36* 21* 25* 36*   Basic Metabolic Panel:  Recent Labs Lab 07/01/16 2355 07/03/16 0424 07/04/16 0506 07/05/16 0510 07/06/16 0419  NA 135 137 138 140 138  K 3.3* 3.4* 3.8 3.8 3.5  CL 99* 99* 102 103 103  CO2 26 26 27 25 23   GLUCOSE 109* 93 90 92 90  BUN 19 49* 35* 52* 65*  CREATININE 4.49* 7.27* 5.20* 7.13* 8.45*  CALCIUM 8.3* 8.8* 8.7* 8.9 8.8*  PHOS 3.3 4.8* 4.0 4.6 5.1*   Liver Function Tests:  Recent Labs Lab 07/01/16 2355 07/03/16 0424 07/04/16 0506 07/05/16 0510 07/06/16 0419  ALBUMIN 2.8* 2.8* 2.7* 2.7* 2.8*    Coagulation Profile:  Recent Labs Lab 07/01/16 1819  INR 1.12   Urine analysis:    Component Value Date/Time   COLORURINE YELLOW 06/25/2016 1745   APPEARANCEUR HAZY (A) 06/25/2016 1745   LABSPEC 1.009 06/25/2016 1745   PHURINE 5.0 06/25/2016 1745   GLUCOSEU NEGATIVE 06/25/2016 1745   HGBUR SMALL (A) 06/25/2016 1745   BILIRUBINUR NEGATIVE 06/25/2016 1745   KETONESUR NEGATIVE 06/25/2016 1745   PROTEINUR >=300 (A) 06/25/2016 1745   NITRITE NEGATIVE 06/25/2016 1745   LEUKOCYTESUR NEGATIVE 06/25/2016 1745   Recent Results (from the past 240 hour(s))  Surgical PCR screen     Status: None   Collection Time: 06/27/16  9:45 PM  Result Value Ref Range Status   MRSA, PCR NEGATIVE NEGATIVE Final   Staphylococcus aureus NEGATIVE NEGATIVE Final    Comment:        The Xpert SA Assay (FDA approved for NASAL specimens in patients over 31 years of age), is one component of a comprehensive surveillance program.  Test performance has been validated by [facility] for patients  greater than or equal to 43 year old. It is not intended to diagnose infection nor to guide or monitor treatment.      Radiology Studies: No results found.  Scheduled Meds: . bisacodyl  10 mg Rectal Daily  . calcitRIOL  0.5 mcg Oral Daily  . cyclobenzaprine  5 mg Oral Once  . darbepoetin (ARANESP) injection - DIALYSIS  200 mcg Intravenous Q Sat-HD  . docusate sodium  200 mg Oral BID  . famotidine  20 mg Oral Daily  . feeding supplement (NEPRO CARB STEADY)  237 mL Oral BID BM  . feeding supplement (PRO-STAT  SUGAR FREE 64)  30 mL Oral BID  . fluconazole  200 mg Oral Daily  . haemophilus B polysaccharide conjugate vaccine  0.5 mL Intramuscular Once  . loratadine  10 mg Oral Daily  . magic mouthwash  15 mL Oral QID  . [START ON 07/07/2016] meningococcal B  0.5 mL Intramuscular Once   And  . [START ON 07/07/2016] meningococcal oligosaccharide  0.5 mL Intramuscular Once  . metoprolol succinate  50 mg Oral QHS  . multivitamin  1 tablet Oral Daily  . pantoprazole  40 mg Oral Daily  . [START ON 07/07/2016] pneumococcal 13-valent conjugate vaccine  0.5 mL Intramuscular Tomorrow-1000  . sodium chloride flush  3 mL Intravenous Q12H   Continuous Infusions: . sodium chloride 10 mL/hr at 06/28/16 1112  . sodium chloride    . sodium chloride    . sodium chloride      LOS: 11 days   Time spent: 15 minutes   Faye Ramsay, MD [facility] Pager [phone]  If 7PM-7AM, please contact night-coverage [email or web address] Password [email or web address] 07/06/2016, 3:35 PM

## 2016-07-06 NOTE — Progress Notes (Signed)
Hematology: Repeat blood film from this morning reviewed.  Schistocytes still present but less than on yesterday's slide.  Platelet count slightly higher at 36,000.  LDH continues to trend down now almost normal at 196. Impression: Coombs negative microangiopathic process with associated thrombocytopenia and acute renal failure.  Findings still concerning for atypical hemolytic uremic syndrome. I discussed with nephrology and internal medicine.  Kidney biopsy results show advanced end-stage changes that would not likely be reversible if the patient were given Eculizumab. We mutually agreed on close observation alone at present.  Await results of ADAM-TS enzyme analysis.  As you know this is really only useful and excluding TTP.  If it is normal or only slightly decreased it is compatible with but not diagnostic of atypical HUS. Repeat haptoglobin just to see if there was transient hemolysis.

## 2016-07-07 ENCOUNTER — Encounter (HOSPITAL_COMMUNITY): Payer: Self-pay

## 2016-07-07 DIAGNOSIS — E876 Hypokalemia: Secondary | ICD-10-CM | POA: Insufficient documentation

## 2016-07-07 DIAGNOSIS — K588 Other irritable bowel syndrome: Secondary | ICD-10-CM | POA: Insufficient documentation

## 2016-07-07 DIAGNOSIS — N2581 Secondary hyperparathyroidism of renal origin: Secondary | ICD-10-CM | POA: Insufficient documentation

## 2016-07-07 DIAGNOSIS — K589 Irritable bowel syndrome without diarrhea: Secondary | ICD-10-CM | POA: Insufficient documentation

## 2016-07-07 DIAGNOSIS — L941 Linear scleroderma: Secondary | ICD-10-CM | POA: Insufficient documentation

## 2016-07-07 DIAGNOSIS — D509 Iron deficiency anemia, unspecified: Secondary | ICD-10-CM | POA: Insufficient documentation

## 2016-07-07 DIAGNOSIS — I341 Nonrheumatic mitral (valve) prolapse: Secondary | ICD-10-CM | POA: Insufficient documentation

## 2016-07-07 DIAGNOSIS — D631 Anemia in chronic kidney disease: Secondary | ICD-10-CM | POA: Insufficient documentation

## 2016-07-07 DIAGNOSIS — Z4931 Encounter for adequacy testing for hemodialysis: Secondary | ICD-10-CM | POA: Insufficient documentation

## 2016-07-07 DIAGNOSIS — N189 Chronic kidney disease, unspecified: Secondary | ICD-10-CM | POA: Insufficient documentation

## 2016-07-07 DIAGNOSIS — D6959 Other secondary thrombocytopenia: Secondary | ICD-10-CM | POA: Insufficient documentation

## 2016-07-07 LAB — CBC WITH DIFFERENTIAL/PLATELET
Basophils Absolute: 0 10*3/uL (ref 0.0–0.1)
Basophils Relative: 0 %
Eosinophils Absolute: 0 10*3/uL (ref 0.0–0.7)
Eosinophils Relative: 1 %
HEMATOCRIT: 31 % — AB (ref 36.0–46.0)
Hemoglobin: 9.1 g/dL — ABNORMAL LOW (ref 12.0–15.0)
LYMPHS ABS: 0.9 10*3/uL (ref 0.7–4.0)
LYMPHS PCT: 17 %
MCH: 28.3 pg (ref 26.0–34.0)
MCHC: 29.4 g/dL — AB (ref 30.0–36.0)
MCV: 96.6 fL (ref 78.0–100.0)
MONO ABS: 0.6 10*3/uL (ref 0.1–1.0)
MONOS PCT: 12 %
NEUTROS ABS: 3.7 10*3/uL (ref 1.7–7.7)
Neutrophils Relative %: 70 %
Platelets: 59 10*3/uL — ABNORMAL LOW (ref 150–400)
RBC: 3.21 MIL/uL — ABNORMAL LOW (ref 3.87–5.11)
RDW: 20.9 % — AB (ref 11.5–15.5)
WBC: 5.3 10*3/uL (ref 4.0–10.5)

## 2016-07-07 LAB — RETICULOCYTES
RBC.: 3.21 MIL/uL — ABNORMAL LOW (ref 3.87–5.11)
Retic Count, Absolute: 260 10*3/uL — ABNORMAL HIGH (ref 19.0–186.0)
Retic Ct Pct: 8.1 % — ABNORMAL HIGH (ref 0.4–3.1)

## 2016-07-07 LAB — RENAL FUNCTION PANEL
ANION GAP: 9 (ref 5–15)
Albumin: 2.9 g/dL — ABNORMAL LOW (ref 3.5–5.0)
BUN: 28 mg/dL — ABNORMAL HIGH (ref 6–20)
CALCIUM: 9.1 mg/dL (ref 8.9–10.3)
CHLORIDE: 99 mmol/L — AB (ref 101–111)
CO2: 29 mmol/L (ref 22–32)
Creatinine, Ser: 5.63 mg/dL — ABNORMAL HIGH (ref 0.44–1.00)
GFR calc Af Amer: 9 mL/min — ABNORMAL LOW (ref >60)
GFR calc non Af Amer: 8 mL/min — ABNORMAL LOW (ref >60)
GLUCOSE: 131 mg/dL — AB (ref 65–99)
POTASSIUM: 3.6 mmol/L (ref 3.5–5.1)
Phosphorus: 3.9 mg/dL (ref 2.5–4.6)
SODIUM: 137 mmol/L (ref 135–145)

## 2016-07-07 LAB — LACTATE DEHYDROGENASE: LDH: 232 U/L — ABNORMAL HIGH (ref 98–192)

## 2016-07-07 MED ORDER — DOCUSATE SODIUM 100 MG PO CAPS: 200.0000 mg | ORAL_CAPSULE | Freq: Two times a day (BID) | ORAL | 0 refills | Status: DC

## 2016-07-07 MED ORDER — CALCITRIOL 0.5 MCG PO CAPS: 0.5000 ug | ORAL_CAPSULE | Freq: Every day | ORAL | 0 refills | Status: DC

## 2016-07-07 MED ORDER — AMOXICILLIN-POT CLAVULANATE 875-125 MG PO TABS: 1.0000 | ORAL_TABLET | Freq: Two times a day (BID) | ORAL | Status: DC

## 2016-07-07 MED ORDER — METOPROLOL SUCCINATE ER 25 MG PO TB24: 25.0000 mg | ORAL_TABLET | Freq: Every day | ORAL | 0 refills | Status: DC

## 2016-07-07 MED ORDER — PHENYLEPHRINE HCL 0.5 % NA SOLN: 1.0000 [drp] | Freq: Four times a day (QID) | NASAL | 1 refills | Status: DC | PRN

## 2016-07-07 MED ORDER — METOPROLOL SUCCINATE ER 25 MG PO TB24: 25.0000 mg | ORAL_TABLET | Freq: Every day | ORAL | Status: DC

## 2016-07-07 MED ORDER — AMOXICILLIN-POT CLAVULANATE 875-125 MG PO TABS: 1.0000 | ORAL_TABLET | Freq: Two times a day (BID) | ORAL | 0 refills | Status: DC

## 2016-07-07 MED ORDER — CYCLOBENZAPRINE HCL 5 MG PO TABS: 5.0000 mg | ORAL_TABLET | Freq: Three times a day (TID) | ORAL | 0 refills | Status: DC | PRN

## 2016-07-07 MED ORDER — PHENYLEPHRINE HCL 0.5 % NA SOLN
1.0000 [drp] | Freq: Four times a day (QID) | NASAL | Status: DC | PRN
  Administered 2016-07-07: 1 [drp] via NASAL
  Filled 2016-07-07 (×2): qty 15

## 2016-07-07 MED ORDER — RENA-VITE PO TABS: 1.0000 | ORAL_TABLET | Freq: Every day | ORAL | 0 refills | Status: DC

## 2016-07-07 NOTE — Discharge Summary (Signed)
Physician Discharge Summary  Katie Nunez ZHY:[id number] DOB: 07-04-1955 DOA: 06/25/2016  PCP: [name], MD  Admit date: 06/25/2016 Discharge date: 07/07/2016  Recommendations for Outpatient Follow-up:  1. Pt will need to follow up with PCP in 1-2 weeks post discharge 2. Please also check CBC to evaluate Hg and Hct levels, Plt level  3. Pt started on HD TTS schedule 4. Pt to see Dr. Beryle Beams on follow up   Discharge Diagnoses:  Principal Problem:   ARF (acute renal failure) ([place]) Active Problems:   Scleroderma ([place])   Rheumatoid arthritis ([place])   Hypertension   Anxiety   Thrombocytopenia ([place])  Discharge Condition: Stable  Diet recommendation: Heart healthy diet discussed in details, renal diet   History of present illness: Patient is 61 year old female with known scleroderma, rheumatoid arthritis, fibromyalgia, presented after being instructed by her nephrologist due to worsening kidney function tests. Per her nephrologist, creatinine in May 2018 was 4.7, new creatinine close to 8.  Assessment & Plan:   CKD5, AKI -->ESRD - had renal bx on 06/29/2016 - so far: ANA negative, MPO and PR3 ANCA negative , c-ANCA negative, p- ANCA mildly positive 1:80 - preliminary biopsy results with MEMRANOUS GN and collapsing variant FSGS with diffuse SEVERE TUBULOINTERSTITIAL SCARRING, likely ESRD - pt is s/p HD x 5, tolerating HD well, next schedule is TTS - s/p TDC and LAVF on 6/18, OP placement AF on TTS  History of rheumatoid arthritis and scleroderma - per nephrology, not scleroderma renal crisis - pt follows Dr. Boris Lown rheumatologist at [facility]  HTN, essential - reasonable control for now  - please note that nebivolol replaced by Metoprolol   Sore throat with post nasal drip - with low grade fever  - provided oral Augmentin to complete therapy   Epistaxis - 1 episode on 6/20, patient reports history of epistaxis and is seeing ENT - nasal packing as needed    Post nasal drip with oral thrush - pt still congested, trush much better - diflucan day #4, resolved at this point, will stop Diflucan   Anemia of chronic disease - renal disease, scleroderma, rheumatoid arthritis  - no active bleeding   Left buttock area bump - with small redness - non tender - much better this AM, no further interventions at this time per pt's wishes   Thrombocytopenia, acute onset and low grade non immune hemolysis  - Sudden drop in platelets from 209 --> 64 --> 13 K on 6/21 - so far Coombs test - - LDH mildly elevated  -  HIT testing negative  - smear reviewed yesterday, schistocytes,  - recommendation is to vaccinate her against meningitis, pneumococcus, and Haemophilus influenzaein anticipation of possible use of Eculizumab (Soliris)anti-C5 A complement antibody - Plt up to 59 K this AM, outpatient follow up recommended   Hypokalemia - resolved   GERD - stable   Obesity - Body mass index is 30.56 kg/m.  DVT prophylaxis: SCDs Code Status: Full Family Communication: pt at bedside  Disposition Plan: home   Consultants:   Nephrology  IR  Vascular surgery  Heme/Onc  Procedures:   Renal ultrasound consistent with intrinsic renal disease  Renal biopsy done 06/28/2016  Graft placement for dialysis 06/28/2016  Right IJ hemodialysis cath by vascular surgery placed 06/28/2016  Antimicrobials:   None  DVT prophylaxis: SCDs Code Status: Full Family Communication: pt at bedside  Disposition Plan: home once Plt more stable   Consultants:   Nephrology  IR  Vascular surgery  Heme/Onc  Procedures:   Renal ultrasound consistent with intrinsic renal disease  Renal biopsy done 06/28/2016  Graft placement for dialysis 06/28/2016  Right IJ hemodialysis cath by vascular surgery placed 06/28/2016  Antimicrobials:   None  Procedures/Studies: US Renal  Result Date: 06/25/2016 CLINICAL DATA:  Acute renal  failure. EXAM: RENAL / URINARY TRACT ULTRASOUND COMPLETE COMPARISON:  None. FINDINGS: Right Kidney: Length: 10.9 cm. Diffusely increased echogenicity of the cortex. Mildly complicated cyst is seen in the superior pole of the right kidney measuring 1.2 x 1.2 x 1.1 cm. Left Kidney: Length: 10.4 cm.  Diffusely increased echogenicity. Bladder: Appears normal for degree of bladder distention. IMPRESSION: Markedly increased echogenicity of the bilateral kidneys, consistent with intrinsic renal disease. Electronically Signed   By: Fidela Salisbury M.D.   On: 06/25/2016 20:19   US Biopsy  Result Date: 06/30/2016 INDICATION: Renal failure EXAM: ULTRASOUND-GUIDED BIOPSY RANDOM RENAL CORE BIOPSY MEDICATIONS: None. ANESTHESIA/SEDATION: Fentanyl 50 mcg IV; Versed 1 mg IV Moderate Sedation Time:  10 The patient was continuously monitored during the procedure by the interventional radiology nurse under my direct supervision. FLUOROSCOPY TIME:  None COMPLICATIONS: None immediate. PROCEDURE: Informed written consent was obtained from the patient after a thorough discussion of the procedural risks, benefits and alternatives. All questions were addressed. Maximal Sterile Barrier Technique was utilized including caps, mask, sterile gowns, sterile gloves, sterile drape, hand hygiene and skin antiseptic. A timeout was performed prior to the initiation of the procedure. The back was prepped with ChloraPrep in a sterile fashion, and a sterile drape was applied covering the operative field. A sterile gown and sterile gloves were used for the procedure. Under sonographic guidance, two 16 gauge core biopsies of the right renal lower pole cortex were obtained. Final imaging was performed. Patient tolerated the procedure well without complication. Vital sign monitoring by nursing staff during the procedure will continue as patient is in the special procedures unit for post procedure observation. FINDINGS: The images document guide needle  placement within the right renal lower pole cortex. Post biopsy images demonstrate no hemorrhage. IMPRESSION: Successful ultrasound-guided random renal cortex core biopsy. Electronically Signed   By: Marybelle Killings M.D.   On: 06/30/2016 08:54   Dg Chest Port 1 View  Result Date: 06/28/2016 CLINICAL DATA:  61 year old female with mid sternal throbbing chest pain and shortness of breath EXAM: PORTABLE CHEST 1 VIEW COMPARISON:  Prior CT scan of the chest 06/13/2008 FINDINGS: Borderline cardiomegaly. Trace atherosclerotic calcifications present in the transverse aorta. The lungs are clear. No focal airspace consolidation, pleural effusion, pneumothorax or pulmonary edema. No suspicious pulmonary mass or nodule. Osseous structures are intact and unremarkable. IMPRESSION: 1. No acute cardiopulmonary process. 2. Borderline cardiomegaly. 3.  Aortic Atherosclerosis (ICD10-170.0) Electronically Signed   By: Jacqulynn Cadet M.D.   On: 06/28/2016 10:33   Dg Chest Port 1v Same Day  Result Date: 06/28/2016 CLINICAL DATA:  Dialysis catheter insertion. EXAM: PORTABLE CHEST 1 VIEW 3:26 p.m. COMPARISON:  06/28/2016 at 10:21 a.m. FINDINGS: Double-lumen dialysis catheter appears in excellent position. No pneumothorax. Heart size and vascularity are normal. Right lung is clear. Focal area of scarring or atelectasis at the left lung base posterior medially. IMPRESSION: Satisfactory postoperative appearance of the chest. Slight atelectasis or scarring at the left lung base. Catheter in good position. Electronically Signed   By: Lorriane Shire M.D.   On: 06/28/2016 15:38   Dg Fluoro Guide Cv Line-no Report  Result Date: 06/28/2016 Fluoroscopy was utilized by the requesting physician.  No radiographic  interpretation.     Discharge Exam: Vitals:   07/07/16 0423 07/07/16 0848  BP: 123/64 109/71  Pulse: 74 72  Resp: 18 18  Temp: 99 F (37.2 C) 98.7 F (37.1 C)   Vitals:   07/06/16 1629 07/06/16 2149 07/07/16 0423  07/07/16 0848  BP: (!) 120/54 117/60 123/64 109/71  Pulse: 77 78 74 72  Resp: 18 18 18 18   Temp: 99.3 F (37.4 C) 99.3 F (37.4 C) 99 F (37.2 C) 98.7 F (37.1 C)  TempSrc: Oral Oral Oral Oral  SpO2: 96% 96% 96% 98%  Weight:  83.5 kg (184 lb)    Height:        General: Pt is alert, follows commands appropriately, not in acute distress Cardiovascular: Regular rate and rhythm, S1/S2 +, no murmurs, no rubs, no gallops Respiratory: Clear to auscultation bilaterally, no wheezing, no crackles, no rhonchi Abdominal: Soft, non tender, non distended, bowel sounds +, no guarding Extremities: no edema, no cyanosis, pulses palpable bilaterally DP and PT Neuro: Grossly nonfocal  Discharge Instructions  Discharge Instructions    Diet - low sodium heart healthy    Complete by:  As directed    Increase activity slowly    Complete by:  As directed      Allergies as of 07/07/2016      Reactions   Heparin    Savella [milnacipran Hcl] Other (See Comments)   Unknown      Medication List    STOP taking these medications   levocetirizine 5 MG tablet Commonly known as:  XYZAL   meloxicam 15 MG tablet Commonly known as:  MOBIC   methylPREDNISolone 4 MG Tbpk tablet Commonly known as:  MEDROL DOSEPAK   nebivolol 10 MG tablet Commonly known as:  BYSTOLIC   Olopatadine HCl 0.6 % Soln     TAKE these medications   allopurinol 100 MG tablet Commonly known as:  ZYLOPRIM Take 1 tablet (100 mg total) by mouth daily.   amoxicillin-clavulanate 875-125 MG tablet Commonly known as:  AUGMENTIN Take 1 tablet by mouth every 12 (twelve) hours.   calcitRIOL 0.5 MCG capsule Commonly known as:  ROCALTROL Take 1 capsule (0.5 mcg total) by mouth daily. Start taking on:  07/08/2016   cetirizine 10 MG tablet Commonly known as:  ZYRTEC Take 10 mg by mouth daily.   cholecalciferol 1000 units tablet Commonly known as:  VITAMIN D Take 2,000 Units by mouth daily.   clonazePAM 0.5 MG  tablet Commonly known as:  KLONOPIN Take 0.5 mg by mouth 2 (two) times daily as needed for anxiety.   cyclobenzaprine 5 MG tablet Commonly known as:  FLEXERIL Take 1 tablet (5 mg total) by mouth 3 (three) times daily as needed for muscle spasms. What changed:  medication strength  how much to take   docusate sodium 100 MG capsule Commonly known as:  COLACE Take 2 capsules (200 mg total) by mouth 2 (two) times daily.   esomeprazole 40 MG capsule Commonly known as:  NEXIUM Take 40 mg by mouth daily as needed (for acid reflux).   metoprolol succinate 25 MG 24 hr tablet Commonly known as:  TOPROL-XL Take 1 tablet (25 mg total) by mouth at bedtime.   MULTIVITAMIN PO Take 1 tablet by mouth daily.   multivitamin Tabs tablet Take 1 tablet by mouth daily. Start taking on:  07/08/2016   phenylephrine 0.5 % nasal solution Commonly known as:  NEO-SYNEPHRINE Place 1 drop into both nostrils every 6 (six) hours as  needed for congestion.      Follow-up Information    Early, Arvilla Meres, MD Follow up in 4 week(s).   Specialties:  Vascular Surgery, Cardiology Why:  office will call Contact information: [place] [address] [phone]        [name], MD Follow up.   Specialty:  Family Medicine Contact information: [place] [address] [phone]        Annia Belt, MD Follow up.   Specialty:  Oncology Contact information: [place] [address] [phone]        Theodis Blaze, MD. Call.   Specialty:  Internal Medicine Why:  call my cell phone [phone] Contact information: [address] [place]  [address] [phone]            The results of significant diagnostics from this hospitalization (including imaging, microbiology, ancillary and laboratory) are listed below for reference.     Microbiology: Recent Results (from the past 240 hour(s))   Surgical PCR screen     Status: None   Collection Time: 06/27/16  9:45 PM  Result Value Ref Range Status   MRSA, PCR NEGATIVE NEGATIVE Final   Staphylococcus aureus NEGATIVE NEGATIVE Final    Comment:        The Xpert SA Assay (FDA approved for NASAL specimens in patients over 54 years of age), is one component of a comprehensive surveillance program.  Test performance has been validated by [facility] for patients greater than or equal to 13 year old. It is not intended to diagnose infection nor to guide or monitor treatment.      Labs: Basic Metabolic Panel:  Recent Labs Lab 07/03/16 0424 07/04/16 0506 07/05/16 0510 07/06/16 0419 07/07/16 0916  NA 137 138 140 138 137  K 3.4* 3.8 3.8 3.5 3.6  CL 99* 102 103 103 99*  CO2 26 27 25 23 29   GLUCOSE 93 90 92 90 131*  BUN 49* 35* 52* 65* 28*  CREATININE 7.27* 5.20* 7.13* 8.45* 5.63*  CALCIUM 8.8* 8.7* 8.9 8.8* 9.1  PHOS 4.8* 4.0 4.6 5.1* 3.9   Liver Function Tests:  Recent Labs Lab 07/03/16 0424 07/04/16 0506 07/05/16 0510 07/06/16 0419 07/07/16 0916  ALBUMIN 2.8* 2.7* 2.7* 2.8* 2.9*   CBC:  Recent Labs Lab 07/03/16 0424 07/04/16 0506 07/05/16 0510 07/06/16 0418 07/07/16 0916  WBC 5.5 4.9 5.0 6.0 5.3  NEUTROABS 3.4 2.9 3.0 4.0 3.7  HGB 8.1* 7.9* 7.8* 8.0* 9.1*  HCT 25.8* 25.5* 25.4* 26.5* 31.0*  MCV 89.9 93.1 92.7 94.6 96.6  PLT 36* 21* 25* 36* 59*   SIGNED: Time coordinating discharge: 45 minutes  Faye Ramsay, MD  [facility] 07/07/2016, 2:34 PM Pager [phone]  If 7PM-7AM, please contact night-coverage [email or web address] Password [email or web address]

## 2016-07-07 NOTE — Progress Notes (Signed)
[facility] Progress Note   Subjective: no c/o's on HD.   Objective Vitals:   07/06/16 1629 07/06/16 2149 07/07/16 0423 07/07/16 0848  BP: (!) 120/54 117/60 123/64 109/71  Pulse: 77 78 74 72  Resp: 18 18 18 18   Temp: 99.3 F (37.4 C) 99.3 F (37.4 C) 99 F (37.2 C) 98.7 F (37.1 C)  TempSrc: Oral Oral Oral Oral  SpO2: 96% 96% 96% 98%  Weight:  83.5 kg (184 lb)    Height:       Physical Exam General: Pleasant, NAD Heart: S1,S2, RRR No JVD.  Lungs:CTAB A/P Abdomen: Active BS Extremities: No LE edema Dialysis Access: RIJ [facility] CDI. LUE AVF + bruit    Additional Objective Labs: Basic Metabolic Panel:  Recent Labs Lab 07/04/16 0506 07/05/16 0510 07/06/16 0419  NA 138 140 138  K 3.8 3.8 3.5  CL 102 103 103  CO2 27 25 23   GLUCOSE 90 92 90  BUN 35* 52* 65*  CREATININE 5.20* 7.13* 8.45*  CALCIUM 8.7* 8.9 8.8*  PHOS 4.0 4.6 5.1*   Liver Function Tests:  Recent Labs Lab 07/04/16 0506 07/05/16 0510 07/06/16 0419  ALBUMIN 2.7* 2.7* 2.8*   No results for input(s): LIPASE, AMYLASE in the last 168 hours. CBC:  Recent Labs Lab 07/03/16 0424 07/04/16 0506 07/05/16 0510 07/06/16 0418 07/07/16 0916  WBC 5.5 4.9 5.0 6.0 5.3  NEUTROABS 3.4 2.9 3.0 4.0 3.7  HGB 8.1* 7.9* 7.8* 8.0* 9.1*  HCT 25.8* 25.5* 25.4* 26.5* 31.0*  MCV 89.9 93.1 92.7 94.6 96.6  PLT 36* 21* 25* 36* 59*   Blood Culture No results found for: SDES, SPECREQUEST, CULT, REPTSTATUS  Cardiac Enzymes: No results for input(s): CKTOTAL, CKMB, CKMBINDEX, TROPONINI in the last 168 hours. CBG: No results for input(s): GLUCAP in the last 168 hours. Iron Studies: No results for input(s): IRON, TIBC, TRANSFERRIN, FERRITIN in the last 72 hours. @lablastinr3 @ Studies/Results: No results found. Medications: . sodium chloride 10 mL/hr at 06/28/16 1112  . sodium chloride    . sodium chloride    . sodium chloride     . bisacodyl  10 mg Rectal Daily  . calcitRIOL  0.5 mcg Oral  Daily  . cyclobenzaprine  5 mg Oral Once  . darbepoetin (ARANESP) injection - DIALYSIS  200 mcg Intravenous Q Sat-HD  . docusate sodium  200 mg Oral BID  . famotidine  20 mg Oral Daily  . feeding supplement (NEPRO CARB STEADY)  237 mL Oral BID BM  . feeding supplement (PRO-STAT SUGAR FREE 64)  30 mL Oral BID  . fluconazole  200 mg Oral Daily  . loratadine  10 mg Oral Daily  . meningococcal B  0.5 mL Intramuscular Once   And  . meningococcal oligosaccharide  0.5 mL Intramuscular Once  . metoprolol succinate  25 mg Oral QHS  . multivitamin  1 tablet Oral Daily  . pantoprazole  40 mg Oral Daily  . pneumococcal 13-valent conjugate vaccine  0.5 mL Intramuscular Tomorrow-1000  . sodium chloride flush  3 mL Intravenous Q12H     Overview: 61 year old BF with progressive renal failure. Hx systemic sclerosis, ?rheumatoid arthritis, fibromyalgia, gout, HTN. Cr 1.16 in 08/2014, 1.69 12/2014, 1.58 05/2015, 2.2 09/2015, 3.62 01/2016, 4.71 05/28/16 -> 9 on admit 6/15.  Date   Creat  Proteinuria  Notes Aug 16   1.16  < 1gm   rheum Dec 16   1.69     rheum May 17  1.58     rheum Sept 17  2.2  < 3gm   rheum Jan 18   3.62     rheum May 28, 2016  4.71     rheum > referred to [facility]  Jun 24, 2016  8.7     Seen by [facility], admitted , kidney bx done 6/19       Assessment/Plan: 1.CKD5/AKI ->now ESRD: all serologies negative except p-ANCA mildly positive titer 1:80. Renal u/s 6/15  with "markedly increased echogenicity."Underwent kidney biopsy 6/19, prelim results showing "membranous GN and collapsing variant FSGS with diffuse severe tubulointerstitial scarring". Would not expect any renal recovery with HUS therapy given the long time-course of her CKD and the results of the biopsy, have d/w heme-onc. S/P TDC and L AVF on 6/18. Has outpt chair at AF TTS 1st shift.  2. Thrombocytopenia: New as of 6/21. HIT assay negative; argatroban stopped. Platelets 36. Heme-onc following.  3.HTN/volume: BP stable,  on metoprolol 50mg  QHS only. Run even.  4. Anemia: Hgb 7.8 On Aranesp 200 mcg Q Wednesday (first dose 6/20), actually not given 06/26/16 although ordered. Tsat 20%; given feraheme 510 mg x 1 on 6/17, now getting course of Ferrelicit. 5. Secondary hyperparathyroidism:Corr Ca ok, Phos 4. On calcitriol 0. 35mcg daily; no binders yet. 6. Nutrition: Alb 2.7. Cont prostat-stop nepro-giving her diarrhea.  7. Scleroderma: followed by Dr. Boris Lown. 8. Dispo: has OP HD chair; pending plts.   Kelly Splinter MD [employer] pager [phone]   07/06/2016, 10:35 AM

## 2016-07-07 NOTE — Progress Notes (Signed)
[facility] Progress Note   Subjective: "I'm feeling OK". Talking with MD.  No C/Os.   Objective Vitals:   07/06/16 1629 07/06/16 2149 07/07/16 0423 07/07/16 0848  BP: (!) 120/54 117/60 123/64 109/71  Pulse: 77 78 74 72  Resp: 18 18 18 18   Temp: 99.3 F (37.4 C) 99.3 F (37.4 C) 99 F (37.2 C) 98.7 F (37.1 C)  TempSrc: Oral Oral Oral Oral  SpO2: 96% 96% 96% 98%  Weight:  83.5 kg (184 lb)    Height:       Physical Exam General: Pleasant, well appearing NAD Heart: S1,S2, RRR Lungs: CTAB  Abdomen: non-distended, soft Extremities: No LE edema Dialysis Access: RIJ [facility] Drsg CDI, LUA AVF + bruit   Additional Objective Labs: Basic Metabolic Panel:  Recent Labs Lab 07/04/16 0506 07/05/16 0510 07/06/16 0419  NA 138 140 138  K 3.8 3.8 3.5  CL 102 103 103  CO2 27 25 23   GLUCOSE 90 92 90  BUN 35* 52* 65*  CREATININE 5.20* 7.13* 8.45*  CALCIUM 8.7* 8.9 8.8*  PHOS 4.0 4.6 5.1*   Liver Function Tests:  Recent Labs Lab 07/04/16 0506 07/05/16 0510 07/06/16 0419  ALBUMIN 2.7* 2.7* 2.8*   No results for input(s): LIPASE, AMYLASE in the last 168 hours. CBC:  Recent Labs Lab 07/03/16 0424 07/04/16 0506 07/05/16 0510 07/06/16 0418 07/07/16 0916  WBC 5.5 4.9 5.0 6.0 5.3  NEUTROABS 3.4 2.9 3.0 4.0 3.7  HGB 8.1* 7.9* 7.8* 8.0* 9.1*  HCT 25.8* 25.5* 25.4* 26.5* 31.0*  MCV 89.9 93.1 92.7 94.6 96.6  PLT 36* 21* 25* 36* 59*   Blood Culture No results found for: SDES, SPECREQUEST, CULT, REPTSTATUS  Cardiac Enzymes: No results for input(s): CKTOTAL, CKMB, CKMBINDEX, TROPONINI in the last 168 hours. CBG: No results for input(s): GLUCAP in the last 168 hours. Iron Studies: No results for input(s): IRON, TIBC, TRANSFERRIN, FERRITIN in the last 72 hours. @lablastinr3 @ Studies/Results: No results found. Medications: . sodium chloride 10 mL/hr at 06/28/16 1112  . sodium chloride    . sodium chloride    . sodium chloride     . bisacodyl  10 mg  Rectal Daily  . calcitRIOL  0.5 mcg Oral Daily  . cyclobenzaprine  5 mg Oral Once  . darbepoetin (ARANESP) injection - DIALYSIS  200 mcg Intravenous Q Sat-HD  . docusate sodium  200 mg Oral BID  . famotidine  20 mg Oral Daily  . feeding supplement (NEPRO CARB STEADY)  237 mL Oral BID BM  . feeding supplement (PRO-STAT SUGAR FREE 64)  30 mL Oral BID  . fluconazole  200 mg Oral Daily  . loratadine  10 mg Oral Daily  . meningococcal B  0.5 mL Intramuscular Once   And  . meningococcal oligosaccharide  0.5 mL Intramuscular Once  . metoprolol succinate  25 mg Oral QHS  . multivitamin  1 tablet Oral Daily  . pantoprazole  40 mg Oral Daily  . pneumococcal 13-valent conjugate vaccine  0.5 mL Intramuscular Tomorrow-1000  . sodium chloride flush  3 mL Intravenous Q12H     Assessment/Plan: 1.CKD5/AKI ->now ESRD: all serolgies negative exceptp-ANCA mildly positive titer 1:80. Renal u/s 6/15 with "markedly increased echogenicity."Underwent kidney biopsy 6/19, prelim results showing "membranous GN and collapsing variant FSGS with diffuse severe tubulointerstitial scarring". Likely ESRD.  Would not expect any renal recovery with HUS therapy given the long time-course of her CKD and the results of the biopsy, have d/w heme-onc. S/P  TDC and L AVF on 6/18.  2. Thrombocytopenia: New as of 6/21. HIT assay negative; argatroban stopped. Platelets 36. Heme-onc following. Platelets coming up. Concern was for atypical hemolytic uremic syndrome, however CBC and platelets improving on their own. Results from ADAM-TS pending.  3.HTN/volume: BP stable, on metoprolol 50mg  QHS only. HD 06/26 Pre wt 83.3 net UF 500 Post wt 83.4. Will tentatively use 83 kgs as EDW. Volume and BP stable.  4. Anemia: 9.1 cont weekly ESA. Is receiving Fe Load.  5. Secondary hyperparathyroidism:Corr Ca ok, Phos 4. On calcitriol 0. 47mcg daily; no binders yet. 6. Nutrition: Alb 2.7. Cont prostat-stop nepro-giving her diarrhea.  7.  Scleroderma: followed by Dr. Boris Lown.   Dispo: Has been clipped to [place] T,TH,S, 1st shift. Volume status stable. Ready for DC from renal perspective.     Rita H. Brown NP-C 07/07/2016, 10:32 AM  [facility] [phone]  Pt seen, examined and agree w A/P as above.  Kelly Splinter MD [employer] pager [phone]   07/07/2016, 12:28 PM

## 2016-07-07 NOTE — Progress Notes (Signed)
Hematology: Atypical hemolytic uremic syndrome or what ever this process is appears to be resolving on its own.  Hemoglobin up 1 g without transfusions.  Platelet count now with consistent upward trend at 59,000. ADAM-TS enzyme analysis pending but will not change management. 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 I will follow-up with the patient after discharge.  I will have my office give her a call.

## 2016-07-07 NOTE — Progress Notes (Signed)
Patient discharged to home with family. All instructions reviewed and scripts. IV removed. Patient aware of HD schedule and followup appts and the care of her HD catheter. Patient left unit with all her belongings in stable condition.  Sheliah Plane RN

## 2016-07-08 LAB — HAPTOGLOBIN: Haptoglobin: 187 mg/dL (ref 34–200)

## 2016-07-08 LAB — MISCELLANEOUS TEST

## 2016-07-13 DIAGNOSIS — E46 Unspecified protein-calorie malnutrition: Secondary | ICD-10-CM | POA: Insufficient documentation

## 2016-07-19 ENCOUNTER — Other Ambulatory Visit: Payer: Self-pay | Admitting: Oncology

## 2016-07-19 ENCOUNTER — Other Ambulatory Visit (INDEPENDENT_AMBULATORY_CARE_PROVIDER_SITE_OTHER): Payer: BLUE CROSS/BLUE SHIELD

## 2016-07-19 DIAGNOSIS — I739 Peripheral vascular disease, unspecified: Secondary | ICD-10-CM

## 2016-07-19 DIAGNOSIS — D696 Thrombocytopenia, unspecified: Secondary | ICD-10-CM

## 2016-07-19 DIAGNOSIS — M05711 Rheumatoid arthritis with rheumatoid factor of right shoulder without organ or systems involvement: Secondary | ICD-10-CM

## 2016-07-19 DIAGNOSIS — M349 Systemic sclerosis, unspecified: Secondary | ICD-10-CM

## 2016-07-19 LAB — CBC WITH DIFFERENTIAL/PLATELET
BASOS ABS: 0 10*3/uL (ref 0.0–0.1)
BASOS PCT: 1 %
Eosinophils Absolute: 0 10*3/uL (ref 0.0–0.7)
Eosinophils Relative: 1 %
HEMATOCRIT: 34 % — AB (ref 36.0–46.0)
Hemoglobin: 9.9 g/dL — ABNORMAL LOW (ref 12.0–15.0)
LYMPHS PCT: 30 %
Lymphs Abs: 1.2 10*3/uL (ref 0.7–4.0)
MCH: 29 pg (ref 26.0–34.0)
MCHC: 29.1 g/dL — ABNORMAL LOW (ref 30.0–36.0)
MCV: 99.7 fL (ref 78.0–100.0)
Monocytes Absolute: 0.4 10*3/uL (ref 0.1–1.0)
Monocytes Relative: 9 %
NEUTROS ABS: 2.5 10*3/uL (ref 1.7–7.7)
Neutrophils Relative %: 61 %
PLATELETS: 82 10*3/uL — AB (ref 150–400)
RBC: 3.41 MIL/uL — AB (ref 3.87–5.11)
RDW: 20.3 % — ABNORMAL HIGH (ref 11.5–15.5)
WBC: 4.1 10*3/uL (ref 4.0–10.5)

## 2016-07-19 LAB — LACTATE DEHYDROGENASE: LDH: 234 U/L — AB (ref 98–192)

## 2016-07-19 LAB — RETICULOCYTES
RBC.: 3.41 MIL/uL — AB (ref 3.87–5.11)
RETIC COUNT ABSOLUTE: 160.3 10*3/uL (ref 19.0–186.0)
Retic Ct Pct: 4.7 % — ABNORMAL HIGH (ref 0.4–3.1)

## 2016-07-19 LAB — SAVE SMEAR

## 2016-07-20 ENCOUNTER — Telehealth: Payer: Self-pay | Admitting: *Deleted

## 2016-07-20 ENCOUNTER — Other Ambulatory Visit: Payer: BLUE CROSS/BLUE SHIELD

## 2016-07-20 LAB — HAPTOGLOBIN: Haptoglobin: 108 mg/dL (ref 34–200)

## 2016-07-20 NOTE — Telephone Encounter (Signed)
Pt called / informed "Hb & platelets slowly improving at 9.9 and 82,000 rspectively " per Dr Beryle Beams. Stated ok, thanks for calling  and will [place] on Monday.

## 2016-07-20 NOTE — Telephone Encounter (Signed)
-----   Message from Annia Belt, MD sent at 07/19/2016  4:22 PM EDT ----- Call pt: Hb & platelets slowly improving at 9.9 and 82,000 rspectively

## 2016-07-26 ENCOUNTER — Other Ambulatory Visit: Payer: Self-pay | Admitting: Oncology

## 2016-07-26 ENCOUNTER — Ambulatory Visit (INDEPENDENT_AMBULATORY_CARE_PROVIDER_SITE_OTHER): Payer: BLUE CROSS/BLUE SHIELD | Admitting: Oncology

## 2016-07-26 ENCOUNTER — Telehealth: Payer: Self-pay | Admitting: *Deleted

## 2016-07-26 ENCOUNTER — Encounter: Payer: Self-pay | Admitting: Oncology

## 2016-07-26 VITALS — BP 147/66 | HR 68 | Temp 99.1°F | Ht 66.0 in | Wt 180.2 lb

## 2016-07-26 DIAGNOSIS — D594 Other nonautoimmune hemolytic anemias: Secondary | ICD-10-CM

## 2016-07-26 DIAGNOSIS — D589 Hereditary hemolytic anemia, unspecified: Secondary | ICD-10-CM | POA: Insufficient documentation

## 2016-07-26 DIAGNOSIS — D696 Thrombocytopenia, unspecified: Secondary | ICD-10-CM

## 2016-07-26 DIAGNOSIS — R040 Epistaxis: Secondary | ICD-10-CM | POA: Insufficient documentation

## 2016-07-26 NOTE — Patient Instructions (Signed)
Return for lab tests in one month and again in 3 months Follow up visit with Dr Darnell Level in 3 months 1-2 weeks after lab

## 2016-07-26 NOTE — Progress Notes (Signed)
Hematology and Oncology Follow Up Visit  Katie Nunez [id number] September 06, 1955 61 y.o. 07/26/2016 11:27 AM   Principle Diagnosis: Encounter Diagnoses  Name Primary?  . Thrombocytopenia ([place]) Yes  . Other non-autoimmune hemolytic anemias ([place])   Clinical summary: Pleasant 61 year old woman I recently saw in consultation when she was hospitalized for acute deterioration in renal function now requiring urgent dialysis. Please see my consultation note dated 07/01/2016 and subsequent hospital progress notes for additional details. Briefly: She has a history of an atypical collagen vascular disorder variously called scleroderma or rheumatoid arthritis with a high positive ANA 1: 1280 centromere pattern recorded in September 2017.  Rheumatoid factor 31.  ESR 45.  Symptoms primarily related to myalgias and joint stiffness.  No pulmonary symptoms.  No chronic steroids or any other immunosuppressive drugs.  She had normal renal function documented in August 2015 with creatinine 0.8.  She developed rather abrupt onset of renal function in May 2018 with a creatinine of 4.7.  She was under evaluation for placement of an AV fistula in anticipation of eventual need for dialysis when there was a further  deterioration in her renal function at time of a June 14 August visit to nephrology wiith BUN up to 88 and creatinine 8.3.  3+ proteinuria.  Serum albumin 2.9.  She was admitted to the hospital the next day for further evaluation and treatment.  Initial platelet count was 203,000.  On hospital day 4 platelet count fell to 64,000 and within the next 24 hours as low as 13,000.  No concomitant fall in hemoglobin.  On my initial review of the peripheral blood film there was 2+ polychromasia but no spherocytes or schistocytes.  Platelet count confirmed significantly decreased. She had no previous exposure to heparin except for flushes that she just got in the hospital.  Tests for heparin-induced thrombocytopenia returned  negative.  Only other medication she received were perioperative doses of antibiotics and anesthetic agents at time of placement of vascular fistula.  She had persistent polychromasia and  reticulocyte count rose to 8%.  Interval review of the peripheral blood now revealed the presence of numerous schistocytes and micro-spherocytes which I felt was consistent with a diagnosis of hemolytic uremic syndrome. ADAM-TS enzyme returned low normal at 70% of control as I anticipated excluding TTP.  LDH was elevated at 278 lab normal up to 192.  Coombs test was negative.  Haptoglobin was mid range normal. A renal biopsy was done on June 19 which showed a membranous glomerulopathy and a collapsing version of focal and segmental glomerulosclerosis with diffuse severe tubulo-interstitial scarring.  I considered initiating a trial of eculizumab anti-C5 complement antibody therapy in the hopes of preserving some renal function but the pathology suggested that irreversible damage had already been done to the kidney.  In addition, platelet count slowly started to recover and was 36,000 at time of discharge June 26.  Subsequent counts over the last 3 weeks showed a platelet count of 59,000 on June 27 and 82,000 on July 9.  Hemoglobin rose from 8 g on June 25 to 9.9 g by July 9.  LDH has fluctuated.  Initially falling into the near normal range at time of discharge with value of 196 but most recent value still elevated at 234 on July 9.  After discussion with nephrology, we elected not to start the eculizumab. She did get Pneumovax in the hospital and I believe Haemophilus influenza but not meningococcal vaccine.   Interim History: She is still somewhat shell  shocked from all that has happened in the last few weeks.  She had a temporary dialysis catheter placed in the right subclavian position and an AV fistula formed in the left brachial position.  She is tolerating dialysis well.  She has no major complaints at this time and  has had no interim medical issues.  I have reviewed the peripheral blood films on her last 2 CBCs and there is now only a rare schistocyte.  Medications: reviewed  Allergies:  Allergies  Allergen Reactions  . Heparin   . Savella [Milnacipran Hcl] Other (See Comments)    Unknown    Review of Systems: See above. Remaining ROS negative:   Physical Exam: Blood pressure (!) 147/66, pulse 68, temperature 99.1 F (37.3 C), temperature source Oral, height '5\' 6"'  (1.676 m), weight 180 lb 3.2 oz (81.7 kg), last menstrual period 11/15/2008, SpO2 99 %. Wt Readings from Last 3 Encounters:  07/26/16 180 lb 3.2 oz (81.7 kg)  07/06/16 184 lb (83.5 kg)  01/27/16 187 lb (84.8 kg)     General appearance: Well-nourished African-American woman HENNT: Pharynx no erythema, exudate, mass, or ulcer. No thyromegaly or thyroid nodules Lymph nodes: No cervical, supraclavicular, or axillary lymphadenopathy Breasts:  Lungs: Clear to auscultation, resonant to percussion throughout Heart: Regular rhythm, no murmur, no gallop, no rub, no click, no edema Abdomen: Soft, nontender, normal bowel sounds, no mass, no organomegaly Extremities: No edema, no calf tenderness Musculoskeletal: no joint deformities GU:  Vascular: Carotid pulses 2+, no bruits, distal pulses: Dorsalis pedis 1+ on the left, nonpalpable on the right Temporary Vascular catheter right subclavian position; healed wound from vascular graft left antecubital fossa. Neurologic: Alert, oriented, PERRLA, optic discs sharp and vessels normal, no hemorrhage or exudate, cranial nerves grossly normal, motor strength 5 over 5, reflexes 1+ symmetric, upper body coordination normal, gait normal, Skin: No rash or ecchymosis  Lab Results: CBC W/Diff    Component Value Date/Time   WBC 4.1 07/19/2016 1208   RBC 3.41 (L) 07/19/2016 1208   RBC 3.41 (L) 07/19/2016 1208   HGB 9.9 (L) 07/19/2016 1208   HCT 34.0 (L) 07/19/2016 1208   PLT 82 (L) 07/19/2016 1208    MCV 99.7 07/19/2016 1208   MCH 29.0 07/19/2016 1208   MCHC 29.1 (L) 07/19/2016 1208   RDW 20.3 (H) 07/19/2016 1208   LYMPHSABS 1.2 07/19/2016 1208   MONOABS 0.4 07/19/2016 1208   EOSABS 0.0 07/19/2016 1208   BASOSABS 0.0 07/19/2016 1208     Chemistry      Component Value Date/Time   NA 137 07/07/2016 0916   K 3.6 07/07/2016 0916   CL 99 (L) 07/07/2016 0916   CO2 29 07/07/2016 0916   BUN 28 (H) 07/07/2016 0916   CREATININE 5.63 (H) 07/07/2016 0916      Component Value Date/Time   CALCIUM 9.1 07/07/2016 0916   ALKPHOS 48 06/25/2016 1458   AST 15 06/25/2016 1458   ALT 11 (L) 06/25/2016 1458   BILITOT 0.3 06/25/2016 1458       Radiological Studies: US Biopsy  Result Date: 07/29/16 INDICATION: Renal failure EXAM: ULTRASOUND-GUIDED BIOPSY RANDOM RENAL CORE BIOPSY MEDICATIONS: None. ANESTHESIA/SEDATION: Fentanyl 50 mcg IV; Versed 1 mg IV Moderate Sedation Time:  10 The patient was continuously monitored during the procedure by the interventional radiology nurse under my direct supervision. FLUOROSCOPY TIME:  None COMPLICATIONS: None immediate. PROCEDURE: Informed written consent was obtained from the patient after a thorough discussion of the procedural risks, benefits and alternatives.  All questions were addressed. Maximal Sterile Barrier Technique was utilized including caps, mask, sterile gowns, sterile gloves, sterile drape, hand hygiene and skin antiseptic. A timeout was performed prior to the initiation of the procedure. The back was prepped with ChloraPrep in a sterile fashion, and a sterile drape was applied covering the operative field. A sterile gown and sterile gloves were used for the procedure. Under sonographic guidance, two 16 gauge core biopsies of the right renal lower pole cortex were obtained. Final imaging was performed. Patient tolerated the procedure well without complication. Vital sign monitoring by nursing staff during the procedure will continue as patient is  in the special procedures unit for post procedure observation. FINDINGS: The images document guide needle placement within the right renal lower pole cortex. Post biopsy images demonstrate no hemorrhage. IMPRESSION: Successful ultrasound-guided random renal cortex core biopsy. Electronically Signed   By: Marybelle Killings M.D.   On: 06/30/2016 08:54   Dg Chest Port 1 View  Result Date: 06/28/2016 CLINICAL DATA:  61 year old female with mid sternal throbbing chest pain and shortness of breath EXAM: PORTABLE CHEST 1 VIEW COMPARISON:  Prior CT scan of the chest 06/13/2008 FINDINGS: Borderline cardiomegaly. Trace atherosclerotic calcifications present in the transverse aorta. The lungs are clear. No focal airspace consolidation, pleural effusion, pneumothorax or pulmonary edema. No suspicious pulmonary mass or nodule. Osseous structures are intact and unremarkable. IMPRESSION: 1. No acute cardiopulmonary process. 2. Borderline cardiomegaly. 3.  Aortic Atherosclerosis (ICD10-170.0) Electronically Signed   By: Jacqulynn Cadet M.D.   On: 06/28/2016 10:33   Dg Chest Port 1v Same Day  Result Date: 06/28/2016 CLINICAL DATA:  Dialysis catheter insertion. EXAM: PORTABLE CHEST 1 VIEW 3:26 p.m. COMPARISON:  06/28/2016 at 10:21 a.m. FINDINGS: Double-lumen dialysis catheter appears in excellent position. No pneumothorax. Heart size and vascularity are normal. Right lung is clear. Focal area of scarring or atelectasis at the left lung base posterior medially. IMPRESSION: Satisfactory postoperative appearance of the chest. Slight atelectasis or scarring at the left lung base. Catheter in good position. Electronically Signed   By: Lorriane Shire M.D.   On: 06/28/2016 15:38   Dg Fluoro Guide Cv Line-no Report  Result Date: 06/28/2016 Fluoroscopy was utilized by the requesting physician.  No radiographic interpretation.    Impression:  I believe this lady has atypical hemolytic uremic syndrome as etiology of her acute renal  failure although platelet microthrombi not mentioned on her renal biopsy.  No immunologic studies were done. Given spontaneous improvement in her hemoglobin and platelet count and end-stage nature of the pathology on renal biopsy, as outlined above, we have elected not to start her on the very expensive anticomplement antibody therapy.  I will continue to follow her with interest along with her other physicians.  CC: Patient Care Team: [name], MD as PCP - General Kennith [place], RD as Dietitian (Family Medicine)   Murriel Hopper, MD, [place]  Hematology-Oncology/Internal Medicine     7/16/201811:27 AM

## 2016-07-26 NOTE — Telephone Encounter (Signed)
Call from pt - stated her nephrologist is Dr Fleet Contras at [facility] and dialysis ctr is [facility] in [place].

## 2016-07-27 ENCOUNTER — Encounter: Payer: BLUE CROSS/BLUE SHIELD | Admitting: Oncology

## 2016-07-27 NOTE — Telephone Encounter (Signed)
Thanks. Noted.

## 2016-08-03 ENCOUNTER — Encounter: Payer: Self-pay | Admitting: Vascular Surgery

## 2016-08-04 NOTE — Addendum Note (Signed)
Addended by: Lianne Cure A on: 08/04/2016 12:11 PM   Modules accepted: Orders

## 2016-08-05 DIAGNOSIS — Z2839 Other underimmunization status: Secondary | ICD-10-CM | POA: Insufficient documentation

## 2016-08-05 DIAGNOSIS — Z283 Underimmunization status: Secondary | ICD-10-CM | POA: Insufficient documentation

## 2016-08-10 ENCOUNTER — Ambulatory Visit (HOSPITAL_COMMUNITY)
Admission: RE | Admit: 2016-08-10 | Discharge: 2016-08-10 | Disposition: A | Discharge status: Home / Self Care | Payer: BLUE CROSS/BLUE SHIELD | Source: Ambulatory Visit | Attending: Vascular Surgery | Admitting: Vascular Surgery

## 2016-08-10 DIAGNOSIS — Z992 Dependence on renal dialysis: Secondary | ICD-10-CM | POA: Insufficient documentation

## 2016-08-10 DIAGNOSIS — N186 End stage renal disease: Secondary | ICD-10-CM | POA: Insufficient documentation

## 2016-08-11 ENCOUNTER — Encounter (HOSPITAL_COMMUNITY): Payer: BLUE CROSS/BLUE SHIELD

## 2016-08-18 ENCOUNTER — Encounter: Payer: Self-pay | Admitting: Vascular Surgery

## 2016-08-18 ENCOUNTER — Ambulatory Visit (INDEPENDENT_AMBULATORY_CARE_PROVIDER_SITE_OTHER): Payer: Self-pay | Admitting: Vascular Surgery

## 2016-08-18 VITALS — BP 175/91 | HR 66 | Temp 98.5°F | Resp 18 | Ht 66.0 in | Wt 180.9 lb

## 2016-08-18 DIAGNOSIS — N186 End stage renal disease: Secondary | ICD-10-CM

## 2016-08-18 DIAGNOSIS — Z992 Dependence on renal dialysis: Secondary | ICD-10-CM

## 2016-08-18 NOTE — Progress Notes (Signed)
Patient name: Katie Nunez MRN: [id number] DOB: 1955/01/27 Sex: female  REASON FOR VISIT: Follow-up left upper arm AV fistula creation by myself on 06/28/2016  HPI: Katie Nunez is a 61 y.o. female here to follow-up. Currently having a successful dialysis via her right IJ hemodialysis catheter. She has a left arm AV fistula in the upper extremity. She has had very nice healing of the incision. She has excellent thrill  Current Outpatient Prescriptions  Medication Sig Dispense Refill  . cetirizine (ZYRTEC) 10 MG tablet Take 10 mg by mouth daily.    . cholecalciferol (VITAMIN D) 1000 UNITS tablet Take 2,000 Units by mouth daily.     . clonazePAM (KLONOPIN) 0.5 MG tablet Take 0.5 mg by mouth 2 (two) times daily as needed for anxiety.     . cyclobenzaprine (FLEXERIL) 5 MG tablet Take 1 tablet (5 mg total) by mouth 3 (three) times daily as needed for muscle spasms. 30 tablet 0  . docusate sodium (COLACE) 100 MG capsule Take 2 capsules (200 mg total) by mouth 2 (two) times daily. 10 capsule 0  . metoprolol succinate (TOPROL-XL) 25 MG 24 hr tablet Take 1 tablet (25 mg total) by mouth at bedtime. (Patient taking differently: Take 50 mg by mouth at bedtime. ) 30 tablet 0  . Multiple Vitamins-Minerals (MULTIVITAMIN PO) Take 1 tablet by mouth daily.    . multivitamin (RENA-VIT) TABS tablet Take 1 tablet by mouth daily. 30 tablet 0  . phenylephrine (NEO-SYNEPHRINE) 0.5 % nasal solution Place 1 drop into both nostrils every 6 (six) hours as needed for congestion. 15 mL 1  . allopurinol (ZYLOPRIM) 100 MG tablet Take 1 tablet (100 mg total) by mouth daily. (Patient not taking: Reported on 08/18/2016) 30 tablet 6  . amoxicillin-clavulanate (AUGMENTIN) 875-125 MG tablet Take 1 tablet by mouth every 12 (twelve) hours. (Patient not taking: Reported on 08/18/2016) 10 tablet 0  . calcitRIOL (ROCALTROL) 0.5 MCG capsule Take 1 capsule (0.5 mcg total) by mouth daily. (Patient not  taking: Reported on 08/18/2016) 30 capsule 0  . esomeprazole (NEXIUM) 40 MG capsule Take 40 mg by mouth daily as needed (for acid reflux).      No current facility-administered medications for this visit.      PHYSICAL EXAM: Vitals:   08/18/16 1209 08/18/16 1213  BP: (!) 150/94 (!) 175/91  Pulse: 66   Resp: 18   Temp: 98.5 F (36.9 C)   TempSrc: Oral   Weight: 180 lb 14.4 oz (82.1 kg)   Height: 5\' 6"  (1.676 m)     GENERAL: The patient is a well-nourished female, in no acute distress. The vital signs are documented above. Well-healed incision with excellent thrill and large sized cephalic vein at the antecubital space. The vein does run rather deep above this level.  She does have a formal venous duplex showing good size maturation. The vein does run to the skin. I imaged this with SonoSite ultrasound as well confirming this.  MEDICAL ISSUES: Discuss this with patient. She has had nice size maturation but does have deep route of her cephalic vein in her upper arm. I have recommended outpatient revision with the transposition of the vein to the surface 28 in access. We will schedule this at her earliest convenience. Will need an additional one month following I transposition before use of the fistula and removal of her catheter   Rosetta Posner, MD FACS [facility] Tel [phone] Pager [phone]

## 2016-08-23 ENCOUNTER — Other Ambulatory Visit (INDEPENDENT_AMBULATORY_CARE_PROVIDER_SITE_OTHER): Payer: BLUE CROSS/BLUE SHIELD

## 2016-08-23 DIAGNOSIS — D696 Thrombocytopenia, unspecified: Secondary | ICD-10-CM

## 2016-08-23 DIAGNOSIS — D594 Other nonautoimmune hemolytic anemias: Secondary | ICD-10-CM

## 2016-08-24 ENCOUNTER — Telehealth: Payer: Self-pay | Admitting: *Deleted

## 2016-08-24 LAB — CBC WITH DIFFERENTIAL/PLATELET
Basophils Absolute: 0 10*3/uL (ref 0.0–0.2)
Basos: 1 %
EOS (ABSOLUTE): 0 10*3/uL (ref 0.0–0.4)
Eos: 1 %
HEMATOCRIT: 40.1 % (ref 34.0–46.6)
Hemoglobin: 12.7 g/dL (ref 11.1–15.9)
Immature Grans (Abs): 0 10*3/uL (ref 0.0–0.1)
Immature Granulocytes: 0 %
Lymphocytes Absolute: 1.3 10*3/uL (ref 0.7–3.1)
Lymphs: 35 %
MCH: 29.3 pg (ref 26.6–33.0)
MCHC: 31.7 g/dL (ref 31.5–35.7)
MCV: 92 fL (ref 79–97)
MONOS ABS: 0.5 10*3/uL (ref 0.1–0.9)
Monocytes: 14 %
NEUTROS PCT: 49 %
Neutrophils Absolute: 1.9 10*3/uL (ref 1.4–7.0)
Platelets: 139 10*3/uL — ABNORMAL LOW (ref 150–379)
RBC: 4.34 x10E6/uL (ref 3.77–5.28)
RDW: 16.3 % — AB (ref 12.3–15.4)
WBC: 3.8 10*3/uL (ref 3.4–10.8)

## 2016-08-24 LAB — RETICULOCYTES: Retic Ct Pct: 0.7 % (ref 0.6–2.6)

## 2016-08-24 LAB — HAPTOGLOBIN: Haptoglobin: 142 mg/dL (ref 34–200)

## 2016-08-24 NOTE — Telephone Encounter (Signed)
Pt called / informed "blood counts continue to improve: platelets up to 139,000 " per Dr Beryle Beams. Stated great news.

## 2016-08-24 NOTE — Telephone Encounter (Signed)
-----   Message from Annia Belt, MD sent at 08/24/2016  6:52 AM EDT ----- Call pt: blood counts continue to improve: platelets up to 139,000

## 2016-09-01 DIAGNOSIS — D689 Coagulation defect, unspecified: Secondary | ICD-10-CM | POA: Insufficient documentation

## 2016-09-06 ENCOUNTER — Encounter: Payer: Self-pay | Admitting: Oncology

## 2016-09-09 DIAGNOSIS — Z4802 Encounter for removal of sutures: Secondary | ICD-10-CM | POA: Insufficient documentation

## 2016-09-14 ENCOUNTER — Other Ambulatory Visit: Payer: Self-pay

## 2016-09-24 ENCOUNTER — Encounter (HOSPITAL_COMMUNITY): Payer: Self-pay | Admitting: *Deleted

## 2016-09-26 NOTE — Anesthesia Preprocedure Evaluation (Signed)
Anesthesia Evaluation  Patient identified by MRN, date of birth, ID band Patient awake    Reviewed: Allergy & Precautions, NPO status , Patient's Chart, lab work & pertinent test results  Airway Mallampati: II  TM Distance: >3 FB Neck ROM: Full    Dental no notable dental hx. (+) Teeth Intact, Dental Advisory Given   Pulmonary neg pulmonary ROS,    Pulmonary exam normal breath sounds clear to auscultation       Cardiovascular hypertension, Pt. on medications + Peripheral Vascular Disease  negative cardio ROS Normal cardiovascular exam Rhythm:Regular Rate:Normal     Neuro/Psych negative neurological ROS  negative psych ROS   GI/Hepatic negative GI ROS, Neg liver ROS, GERD  Medicated,  Endo/Other  negative endocrine ROS  Renal/GU ESRF and Renal InsufficiencyRenal diseasenegative Renal ROS  negative genitourinary   Musculoskeletal negative musculoskeletal ROS (+) Arthritis , Rheumatoid disorders,  Fibromyalgia -  Abdominal   Peds negative pediatric ROS (+)  Hematology negative hematology ROS (+)   Anesthesia Other Findings   Reproductive/Obstetrics negative OB ROS                             Anesthesia Physical  Anesthesia Plan  ASA: III  Anesthesia Plan: MAC   Post-op Pain Management:    Induction: Intravenous  PONV Risk Score and Plan: 1 and 2 and Ondansetron, Dexamethasone, Propofol and Treatment may vary due to age or medical condition  Airway Management Planned: Natural Airway and Simple Face Mask  Additional Equipment:   Intra-op Plan:   Post-operative Plan:   Informed Consent: I have reviewed the patients History and Physical, chart, labs and discussed the procedure including the risks, benefits and alternatives for the proposed anesthesia with the patient or authorized representative who has indicated his/her understanding and acceptance.   Dental advisory  given  Plan Discussed with: CRNA and Anesthesiologist  Anesthesia Plan Comments:         Anesthesia Quick Evaluation

## 2016-09-27 ENCOUNTER — Ambulatory Visit (HOSPITAL_COMMUNITY): Payer: BLUE CROSS/BLUE SHIELD | Admitting: Anesthesiology

## 2016-09-27 ENCOUNTER — Encounter (HOSPITAL_COMMUNITY): Payer: Self-pay | Admitting: Urology

## 2016-09-27 ENCOUNTER — Telehealth: Payer: Self-pay | Admitting: Vascular Surgery

## 2016-09-27 ENCOUNTER — Ambulatory Visit (HOSPITAL_COMMUNITY)
Admission: RE | Admit: 2016-09-27 | Discharge: 2016-09-27 | Disposition: A | Discharge status: Home / Self Care | Payer: BLUE CROSS/BLUE SHIELD | Source: Ambulatory Visit | Attending: Vascular Surgery | Admitting: Vascular Surgery

## 2016-09-27 ENCOUNTER — Encounter (HOSPITAL_COMMUNITY)
Admission: RE | Disposition: A | Discharge status: Home / Self Care | Payer: Self-pay | Source: Ambulatory Visit | Attending: Vascular Surgery

## 2016-09-27 DIAGNOSIS — Z992 Dependence on renal dialysis: Secondary | ICD-10-CM | POA: Diagnosis not present

## 2016-09-27 DIAGNOSIS — T82898A Other specified complication of vascular prosthetic devices, implants and grafts, initial encounter: Secondary | ICD-10-CM | POA: Insufficient documentation

## 2016-09-27 DIAGNOSIS — K219 Gastro-esophageal reflux disease without esophagitis: Secondary | ICD-10-CM | POA: Diagnosis not present

## 2016-09-27 DIAGNOSIS — I739 Peripheral vascular disease, unspecified: Secondary | ICD-10-CM | POA: Insufficient documentation

## 2016-09-27 DIAGNOSIS — M069 Rheumatoid arthritis, unspecified: Secondary | ICD-10-CM | POA: Diagnosis not present

## 2016-09-27 DIAGNOSIS — M797 Fibromyalgia: Secondary | ICD-10-CM | POA: Diagnosis not present

## 2016-09-27 DIAGNOSIS — I120 Hypertensive chronic kidney disease with stage 5 chronic kidney disease or end stage renal disease: Secondary | ICD-10-CM | POA: Diagnosis not present

## 2016-09-27 DIAGNOSIS — X58XXXA Exposure to other specified factors, initial encounter: Secondary | ICD-10-CM | POA: Insufficient documentation

## 2016-09-27 DIAGNOSIS — N186 End stage renal disease: Secondary | ICD-10-CM | POA: Diagnosis present

## 2016-09-27 HISTORY — PX: BASCILIC VEIN TRANSPOSITION: SHX5742

## 2016-09-27 HISTORY — DX: Gastro-esophageal reflux disease without esophagitis: K21.9

## 2016-09-27 HISTORY — DX: Anxiety disorder, unspecified: F41.9

## 2016-09-27 LAB — POCT I-STAT 4, (NA,K, GLUC, HGB,HCT)
GLUCOSE: 84 mg/dL (ref 65–99)
HEMATOCRIT: 34 % — AB (ref 36.0–46.0)
Hemoglobin: 11.6 g/dL — ABNORMAL LOW (ref 12.0–15.0)
Potassium: 4.3 mmol/L (ref 3.5–5.1)
SODIUM: 140 mmol/L (ref 135–145)

## 2016-09-27 SURGERY — TRANSPOSITION, VEIN, BASILIC
Anesthesia: Monitor Anesthesia Care | Site: Arm Upper | Laterality: Left

## 2016-09-27 MED ORDER — PHENYLEPHRINE 40 MCG/ML (10ML) SYRINGE FOR IV PUSH (FOR BLOOD PRESSURE SUPPORT)
PREFILLED_SYRINGE | INTRAVENOUS | Status: AC
  Filled 2016-09-27: qty 10

## 2016-09-27 MED ORDER — FENTANYL CITRATE (PF) 250 MCG/5ML IJ SOLN
INTRAMUSCULAR | Status: AC
  Filled 2016-09-27: qty 5

## 2016-09-27 MED ORDER — LIDOCAINE 2% (20 MG/ML) 5 ML SYRINGE
INTRAMUSCULAR | Status: AC
  Filled 2016-09-27: qty 5

## 2016-09-27 MED ORDER — HEPARIN SODIUM (PORCINE) 1000 UNIT/ML IJ SOLN
INTRAMUSCULAR | Status: AC
  Filled 2016-09-27: qty 1

## 2016-09-27 MED ORDER — FENTANYL CITRATE (PF) 100 MCG/2ML IJ SOLN: 25.0000 ug | INTRAMUSCULAR | Status: DC | PRN

## 2016-09-27 MED ORDER — LIDOCAINE-EPINEPHRINE (PF) 1 %-1:200000 IJ SOLN
INTRAMUSCULAR | Status: AC
  Filled 2016-09-27: qty 30

## 2016-09-27 MED ORDER — ONDANSETRON HCL 4 MG/2ML IJ SOLN: 4.0000 mg | Freq: Once | INTRAMUSCULAR | Status: DC | PRN

## 2016-09-27 MED ORDER — PHENYLEPHRINE 40 MCG/ML (10ML) SYRINGE FOR IV PUSH (FOR BLOOD PRESSURE SUPPORT)
PREFILLED_SYRINGE | INTRAVENOUS | Status: DC | PRN
  Administered 2016-09-27: 80 ug via INTRAVENOUS

## 2016-09-27 MED ORDER — PROPOFOL 500 MG/50ML IV EMUL
INTRAVENOUS | Status: DC | PRN
  Administered 2016-09-27: 125 ug/kg/min via INTRAVENOUS

## 2016-09-27 MED ORDER — LIDOCAINE-EPINEPHRINE 0.5 %-1:200000 IJ SOLN
INTRAMUSCULAR | Status: DC | PRN
  Administered 2016-09-27: 15 mL

## 2016-09-27 MED ORDER — CHLORHEXIDINE GLUCONATE CLOTH 2 % EX PADS: 6.0000 | MEDICATED_PAD | Freq: Once | CUTANEOUS | Status: DC

## 2016-09-27 MED ORDER — MIDAZOLAM HCL 2 MG/2ML IJ SOLN
INTRAMUSCULAR | Status: AC
  Filled 2016-09-27: qty 2

## 2016-09-27 MED ORDER — MEPERIDINE HCL 25 MG/ML IJ SOLN: 6.2500 mg | INTRAMUSCULAR | Status: DC | PRN

## 2016-09-27 MED ORDER — ONDANSETRON HCL 4 MG/2ML IJ SOLN
INTRAMUSCULAR | Status: DC | PRN
  Administered 2016-09-27: 4 mg via INTRAVENOUS

## 2016-09-27 MED ORDER — 0.9 % SODIUM CHLORIDE (POUR BTL) OPTIME
TOPICAL | Status: DC | PRN
  Administered 2016-09-27: 1000 mL

## 2016-09-27 MED ORDER — PROPOFOL 10 MG/ML IV BOLUS
INTRAVENOUS | Status: AC
  Filled 2016-09-27: qty 20

## 2016-09-27 MED ORDER — SODIUM CHLORIDE 0.9 % IV SOLN
INTRAVENOUS | Status: DC
  Administered 2016-09-27: 10:00:00 via INTRAVENOUS

## 2016-09-27 MED ORDER — ONDANSETRON HCL 4 MG/2ML IJ SOLN
INTRAMUSCULAR | Status: AC
  Filled 2016-09-27: qty 2

## 2016-09-27 MED ORDER — DEXTROSE 5 % IV SOLN
1.5000 g | INTRAVENOUS | Status: AC
  Administered 2016-09-27: 1.5 g via INTRAVENOUS
  Filled 2016-09-27: qty 1.5

## 2016-09-27 MED ORDER — FENTANYL CITRATE (PF) 100 MCG/2ML IJ SOLN
INTRAMUSCULAR | Status: DC | PRN
  Administered 2016-09-27 (×2): 50 ug via INTRAVENOUS

## 2016-09-27 MED ORDER — PROPOFOL 10 MG/ML IV BOLUS
INTRAVENOUS | Status: DC | PRN
  Administered 2016-09-27: 20 mg via INTRAVENOUS

## 2016-09-27 MED ORDER — OXYCODONE-ACETAMINOPHEN 5-325 MG PO TABS: 1.0000 | ORAL_TABLET | Freq: Four times a day (QID) | ORAL | 0 refills | Status: DC | PRN

## 2016-09-27 MED ORDER — LIDOCAINE 2% (20 MG/ML) 5 ML SYRINGE
INTRAMUSCULAR | Status: DC | PRN
  Administered 2016-09-27: 40 mg via INTRAVENOUS

## 2016-09-27 MED ORDER — MIDAZOLAM HCL 5 MG/5ML IJ SOLN
INTRAMUSCULAR | Status: DC | PRN
  Administered 2016-09-27: 2 mg via INTRAVENOUS

## 2016-09-27 SURGICAL SUPPLY — 27 items
ARMBAND PINK RESTRICT EXTREMIT (MISCELLANEOUS) ×2 IMPLANT
CANISTER SUCT 3000ML PPV (MISCELLANEOUS) ×2 IMPLANT
CANNULA VESSEL 3MM 2 BLNT TIP (CANNULA) ×2 IMPLANT
CLIP LIGATING EXTRA MED SLVR (CLIP) ×2 IMPLANT
CLIP LIGATING EXTRA SM BLUE (MISCELLANEOUS) ×2 IMPLANT
COVER PROBE W GEL 5X96 (DRAPES) ×2 IMPLANT
DECANTER SPIKE VIAL GLASS SM (MISCELLANEOUS) ×2 IMPLANT
DERMABOND ADVANCED (GAUZE/BANDAGES/DRESSINGS) ×1
DERMABOND ADVANCED .7 DNX12 (GAUZE/BANDAGES/DRESSINGS) ×1 IMPLANT
ELECT REM PT RETURN 9FT ADLT (ELECTROSURGICAL) ×2
ELECTRODE REM PT RTRN 9FT ADLT (ELECTROSURGICAL) ×1 IMPLANT
GLOVE SS BIOGEL STRL SZ 7.5 (GLOVE) ×1 IMPLANT
GLOVE SUPERSENSE BIOGEL SZ 7.5 (GLOVE) ×1
GOWN STRL REUS W/ TWL LRG LVL3 (GOWN DISPOSABLE) ×4 IMPLANT
GOWN STRL REUS W/TWL LRG LVL3 (GOWN DISPOSABLE) ×4
KIT BASIN OR (CUSTOM PROCEDURE TRAY) ×2 IMPLANT
KIT ROOM TURNOVER OR (KITS) ×2 IMPLANT
NS IRRIG 1000ML POUR BTL (IV SOLUTION) ×2 IMPLANT
PACK CV ACCESS (CUSTOM PROCEDURE TRAY) ×2 IMPLANT
PAD ARMBOARD 7.5X6 YLW CONV (MISCELLANEOUS) ×4 IMPLANT
SUT PROLENE 6 0 CC (SUTURE) ×2 IMPLANT
SUT SILK 2 0 SH (SUTURE) ×2 IMPLANT
SUT VIC AB 3-0 SH 27 (SUTURE) ×2
SUT VIC AB 3-0 SH 27X BRD (SUTURE) ×2 IMPLANT
SUT VIC AB 4-0 PS2 18 (SUTURE) ×4 IMPLANT
UNDERPAD 30X30 (UNDERPADS AND DIAPERS) ×2 IMPLANT
WATER STERILE IRR 1000ML POUR (IV SOLUTION) ×2 IMPLANT

## 2016-09-27 NOTE — Transfer of Care (Signed)
Immediate Anesthesia Transfer of Care Note  Patient: Katie Nunez  Procedure(s) Performed: Procedure(s): LEFT UPPER ARM CEPHALIC VEIN TRANSPOSITION (Left)  Patient Location: PACU  Anesthesia Type:MAC  Level of Consciousness: awake, alert  and oriented  Airway & Oxygen Therapy: Patient Spontanous Breathing  Post-op Assessment: Report given to RN and Post -op Vital signs reviewed and stable  Post vital signs: Reviewed and stable  Last Vitals:  Vitals:   09/27/16 1012 09/27/16 1558  BP:    Pulse: 76   Resp: 18   Temp:  (!) (P) 36.4 C  SpO2: 99%     Last Pain:  Vitals:   09/27/16 1008  TempSrc:   PainSc: 5       Patients Stated Pain Goal: 3 (56/38/75 6433)  Complications: No apparent anesthesia complications

## 2016-09-27 NOTE — H&P (Signed)
Office Visit   08/18/2016 [facility] -[facility] Stall, Arvilla Meres, MD  Vascular Surgery   ESRD on dialysis [facility])  Dx   Routine Post Op ; Referred by [name], MD  Reason for Visit   Additional Documentation   Vitals:   BP  175/91 (BP Location: Right Arm, Patient Position: Sitting, Cuff Size: Normal)    Pulse 66   Temp 98.5 F (36.9 C) (Oral)   Resp 18   Ht 5\' 6"  (1.676 m)   Wt 180 lb 14.4 oz (82.1 kg)   LMP 11/15/2008   BMI 29.20 kg/m   BSA 1.96 m      More Vitals   Flowsheets:   Custom Formula Data,   MEWS Score,   Anthropometrics     Encounter Info:   Billing Info,   History,   Allergies,   Detailed Report     All Notes   Progress Notes by Rosetta Posner, MD at 08/18/2016 11:30 AM   Author: Rosetta Posner, MD Author Type: Physician Filed: 08/18/2016 1:25 PM  Note Status: Signed Cosign: Cosign Not Required Encounter Date: 08/18/2016  Editor: Rosetta Posner, MD (Physician)                                        Patient name: Katie Nunez         MRN: [id number]        DOB: [birth year]        Sex: female  REASON FOR VISIT: Follow-up left upper arm AV fistula creation by myself on 06/28/2016  HPI: Katie Nunez is a 61 y.o. female here to follow-up. Currently having a successful dialysis via her right IJ hemodialysis catheter. She has a left arm AV fistula in the upper extremity. She has had very nice healing of the incision. She has excellent thrill        Current Outpatient Prescriptions  Medication Sig Dispense Refill  . cetirizine (ZYRTEC) 10 MG tablet Take 10 mg by mouth daily.    . cholecalciferol (VITAMIN D) 1000 UNITS tablet Take 2,000 Units by mouth daily.     . clonazePAM (KLONOPIN) 0.5 MG tablet Take 0.5 mg by mouth 2 (two) times daily as needed for anxiety.     . cyclobenzaprine (FLEXERIL) 5 MG tablet Take 1 tablet (5 mg total) by mouth 3 (three) times daily as needed for muscle spasms. 30 tablet 0  . docusate sodium  (COLACE) 100 MG capsule Take 2 capsules (200 mg total) by mouth 2 (two) times daily. 10 capsule 0  . metoprolol succinate (TOPROL-XL) 25 MG 24 hr tablet Take 1 tablet (25 mg total) by mouth at bedtime. (Patient taking differently: Take 50 mg by mouth at bedtime. ) 30 tablet 0  . Multiple Vitamins-Minerals (MULTIVITAMIN PO) Take 1 tablet by mouth daily.    . multivitamin (RENA-VIT) TABS tablet Take 1 tablet by mouth daily. 30 tablet 0  . phenylephrine (NEO-SYNEPHRINE) 0.5 % nasal solution Place 1 drop into both nostrils every 6 (six) hours as needed for congestion. 15 mL 1  . allopurinol (ZYLOPRIM) 100 MG tablet Take 1 tablet (100 mg total) by mouth daily. (Patient not taking: Reported on 08/18/2016) 30 tablet 6  . amoxicillin-clavulanate (AUGMENTIN) 875-125 MG tablet Take 1 tablet by mouth every 12 (twelve) hours. (Patient not taking: Reported on 08/18/2016) 10 tablet 0  . calcitRIOL (  ROCALTROL) 0.5 MCG capsule Take 1 capsule (0.5 mcg total) by mouth daily. (Patient not taking: Reported on 08/18/2016) 30 capsule 0  . esomeprazole (NEXIUM) 40 MG capsule Take 40 mg by mouth daily as needed (for acid reflux).      No current facility-administered medications for this visit.      PHYSICAL EXAM:     Vitals:   08/18/16 1209 08/18/16 1213  BP: (!) 150/94 (!) 175/91  Pulse: 66   Resp: 18   Temp: 98.5 F (36.9 C)   TempSrc: Oral   Weight: 180 lb 14.4 oz (82.1 kg)   Height: 5\' 6"  (1.676 m)     GENERAL: The patient is a well-nourished female, in no acute distress. The vital signs are documented above. Well-healed incision with excellent thrill and large sized cephalic vein at the antecubital space. The vein does run rather deep above this level.  She does have a formal venous duplex showing good size maturation. The vein does run to the skin. I imaged this with SonoSite ultrasound as well confirming this.  MEDICAL ISSUES: Discuss this with patient. She has had nice size maturation  but does have deep route of her cephalic vein in her upper arm. I have recommended outpatient revision with the transposition of the vein to the surface 28 in access. We will schedule this at her earliest convenience. Will need an additional one month following I transposition before use of the fistula and removal of her catheter   Rosetta Posner, MD FACS [facility] [phone] Pager [phone]    Communications   Addendum:  The patient has been re-examined and re-evaluated.  The patient's history and physical has been reviewed and is unchanged.    Katie Nunez is a 61 y.o. female is being admitted with End Stage Renal Disease N18.6. All the risks, benefits and other treatment options have been discussed with the patient. The patient has consented to proceed with Procedure(s): LEFT UPPER ARM CEPHALIC VEIN TRANSPOSITION as a surgical intervention.  Curt Jews 09/27/2016 1:52 PM Vascular and Vein Surgery

## 2016-09-27 NOTE — Discharge Instructions (Signed)
° °  [facility]  Discharge Instructions  AV Fistula or Graft Surgery for Dialysis Access  Please refer to the following instructions for your post-procedure care. Your surgeon or physician assistant will discuss any changes with you.  Activity  You may drive the day following your surgery, if you are comfortable and no longer taking prescription pain medication. Resume full activity as the soreness in your incision resolves.  Bathing/Showering  You may shower after you go home. Keep your incision dry for 48 hours. Do not soak in a bathtub, hot tub, or swim until the incision heals completely. You may not shower if you have a hemodialysis catheter.  Incision Care  Clean your incision with mild soap and water after 48 hours. Pat the area dry with a clean towel. You do not need a bandage unless otherwise instructed. Do not apply any ointments or creams to your incision. You may have skin glue on your incision. Do not peel it off. It will come off on its own in about one week. Your arm may swell a bit after surgery. To reduce swelling use pillows to elevate your arm so it is above your heart. Your doctor will tell you if you need to lightly wrap your arm with an ACE bandage.  Diet  Resume your normal diet. There are not special food restrictions following this procedure. In order to heal from your surgery, it is CRITICAL to get adequate nutrition. Your body requires vitamins, minerals, and protein. Vegetables are the best source of vitamins and minerals. Vegetables also provide the perfect balance of protein. Processed food has little nutritional value, so try to avoid this.  Medications  Resume taking all of your medications. If your incision is causing pain, you may take over-the counter pain relievers such as acetaminophen (Tylenol). If you were prescribed a stronger pain medication, please be aware these medications can cause nausea and constipation. Prevent  nausea by taking the medication with a snack or meal. Avoid constipation by drinking plenty of fluids and eating foods with high amount of fiber, such as fruits, vegetables, and grains. Do not take Tylenol if you are taking prescription pain medications.  Follow up Your surgeon may want to see you in the office following your access surgery. If so, this will be arranged at the time of your surgery.  Please call us immediately for any of the following conditions:  Increased pain, redness, drainage (pus) from your incision site Fever of 101 degrees or higher Severe or worsening pain at your incision site Hand pain or numbness.  Reduce your risk of vascular disease:  Stop smoking. If you would like help, call [facility] at 1-800-QUIT-NOW (956)439-2347) or [place] [facility] [place] your cholesterol Maintain a desired weight Control your diabetes Keep your blood pressure down  Dialysis  It will take several weeks to several months for your new dialysis access to be ready for use. Your surgeon will determine when it is OK to use it. Your nephrologist will continue to direct your dialysis. You can continue to use your Permcath until your new access is ready for use.   09/27/2016 Katie Nunez [id number] Aug 23, 1955  Surgeon(s): Early, Arvilla Meres, MD  Procedure(s): LEFT UPPER ARM CEPHALIC VEIN TRANSPOSITION  x Do not stick fistula for 6 weeks    If you have any questions, please call the office at [phone].

## 2016-09-27 NOTE — Telephone Encounter (Signed)
Called by pt who was having some oozing from her incision on arrival at home today. Told pt to hold gentle pressure over the incision for 15 minute and if the oozing had not stopped I could see her in the [facility] ER.  I gave her a follow up call just now and she states that after 30 minutes the oozing seems to have stopped.  I again emphasized to her that if it persists I could see her in the ER and she states she will go there if it continues.  Ruta Hinds, MD [facility] [place] Office: [phone] Pager: [phone]

## 2016-09-27 NOTE — Progress Notes (Signed)
Patient states she has "low platelets" states her last draw was lower. [place] to request results, per nurse at center patients platelets were as follows:  09/16/16: 154 09/09/16: 90 08/13/16: 202  Nurse will fax lab results to hospital to place in chart.  Last lab draw in epic noted to be 08/23/16 platelets 139

## 2016-09-27 NOTE — Anesthesia Procedure Notes (Signed)
Procedure Name: MAC Date/Time: 09/27/2016 2:41 PM Performed by: Candis Shine Pre-anesthesia Checklist: Patient identified, Emergency Drugs available, Suction available, Patient being monitored and Timeout performed Patient Re-evaluated:Patient Re-evaluated prior to induction Oxygen Delivery Method: Simple face mask Dental Injury: Teeth and Oropharynx as per pre-operative assessment

## 2016-09-27 NOTE — Op Note (Signed)
    OPERATIVE REPORT  DATE OF SURGERY: 09/27/2016  PATIENT: Katie Nunez, 61 y.o. female MRN: [id number]  DOB: Jul 06, 1955  PRE-OPERATIVE DIAGNOSIS: End-stage renal disease with poorly functioning left upper arm AV fistula related to the location  POST-OPERATIVE DIAGNOSIS:  Same  PROCEDURE: Superficial mobilization of left upper arm AV fistula  SURGEON:  Curt Jews, M.D.  PHYSICIAN ASSISTANT: Samantha Rhyne PAC  ANESTHESIA:  Local with sedation  EBL: Minimal ml  Total I/O In: 400 [I.V.:400] Out: 3 [Blood:3]  BLOOD ADMINISTERED: None  DRAINS: None  SPECIMEN: None  COUNTS CORRECT:  YES  PLAN OF CARE: PACU   PATIENT DISPOSITION:  PACU - hemodynamically stable  PROCEDURE DETAILS: The patient signal And placed supine position where the area of the left lower extremity in usual fashion. SonoSite used to with localization to localize the level of the cephalic vein. This did run under the fat and fascia throughout the upper arm. 2 incisions were made one near the antecubital space one near the shoulder over the cephalic vein fistula. The fat and fascia overlying the cephalic vein was opened. The cephalic vein was mobilized circumferentially from near the arteriovenous anastomosis to the shoulder. Temperature branches were ligated with a 3-0 silk ties and divided. The fat was then closed behind the cephalic vein and with a running 3-0 Vicryl suture. Skin was closed directly over the cephalic vein with running 4-0 subcuticular Vicryl stitch. Sterile dressing was applied and the patient was taken to the recovery room in stable condition   Rosetta Posner, M.D., [facility] 09/27/2016 3:47 PM

## 2016-09-28 ENCOUNTER — Telehealth: Payer: Self-pay | Admitting: Vascular Surgery

## 2016-09-28 ENCOUNTER — Encounter (HOSPITAL_COMMUNITY): Payer: Self-pay | Admitting: Vascular Surgery

## 2016-09-28 NOTE — Telephone Encounter (Signed)
Sched appt 11/02/16 at 2:15. Spoke to pt.

## 2016-09-28 NOTE — Anesthesia Postprocedure Evaluation (Signed)
Anesthesia Post Note  Patient: Katie Nunez  Procedure(s) Performed: Procedure(s) (LRB): LEFT UPPER ARM CEPHALIC VEIN TRANSPOSITION (Left)     Patient location during evaluation: PACU Anesthesia Type: MAC Level of consciousness: awake and alert Pain management: pain level controlled Vital Signs Assessment: post-procedure vital signs reviewed and stable Respiratory status: spontaneous breathing, nonlabored ventilation, respiratory function stable and patient connected to nasal cannula oxygen Cardiovascular status: stable and blood pressure returned to baseline Postop Assessment: no apparent nausea or vomiting Anesthetic complications: no    Last Vitals:  Vitals:   09/27/16 1628 09/27/16 1630  BP:  (!) 152/76  Pulse:  62  Resp:  (!) 23  Temp: (!) 36.1 C   SpO2:  98%    Last Pain:  Vitals:   09/27/16 1008  TempSrc:   PainSc: 5    Pain Goal: Patients Stated Pain Goal: 3 (09/27/16 1008)               Rhyan Wolters

## 2016-09-28 NOTE — Telephone Encounter (Signed)
-----   Message from Mena Goes, RN sent at 09/27/2016  3:56 PM EDT ----- Regarding: 3-4 weeks   ----- Message ----- From: Natividad Brood Sent: 09/27/2016   3:42 PM To: Vvs Charge Pool  S/p superficialization of LUA AVF.  F/u with Dr. Donnetta Hutching in 3-4 weeks.  Thanks

## 2016-11-02 ENCOUNTER — Ambulatory Visit (INDEPENDENT_AMBULATORY_CARE_PROVIDER_SITE_OTHER): Payer: BLUE CROSS/BLUE SHIELD | Admitting: Vascular Surgery

## 2016-11-02 ENCOUNTER — Encounter: Payer: Self-pay | Admitting: Vascular Surgery

## 2016-11-02 VITALS — BP 137/81 | HR 70 | Temp 99.0°F | Resp 18 | Ht 66.0 in | Wt 179.0 lb

## 2016-11-02 DIAGNOSIS — Z992 Dependence on renal dialysis: Secondary | ICD-10-CM

## 2016-11-02 DIAGNOSIS — N186 End stage renal disease: Secondary | ICD-10-CM

## 2016-11-02 NOTE — Progress Notes (Signed)
POST OPERATIVE OFFICE NOTE    CC:  F/u for surgery  HPI:  This is a 61 y.o. female who is s/p superficialization of the left upper arm fistula on 09/27/16.  Her original surgery was on 06/28/16 as well as TDC insertion by Dr. Donnetta Hutching.  She states that she has some soreness around the incisions.  She states she had some bleeding after she got home but this improved with pressure.  She says that she feels as if her dialysis catheter is pulling at the insertion site.  Denies pain in her left hand.  She states that she has had issues with low platelets.  She was tested for HIT and this was negative.   She has an appointment with Dr. Beryle Beams on November 14th for evaluation of this.  She states that she has had some increased cramping in her legs since last being evaluated by Dr. Donnetta Hutching.  She states that it happens at random times and not necessarily with walking.    She dialyzes at [facility] on T/T/S.  She is not having any issues with the catheter.    Allergies  Allergen Reactions  . Heparin Other (See Comments)    Platelets issues (suspected)  . Savella [Milnacipran Hcl] Palpitations and Other (See Comments)    Unknown  . Tape Rash and Other (See Comments)    Welts-- unsure if it was paper or adhesive tape    Current Outpatient Prescriptions  Medication Sig Dispense Refill  . acetaminophen (TYLENOL) 325 MG tablet Take 650 mg by mouth every 6 (six) hours as needed (for pain/headaches.).    Marland Kitchen amLODipine (NORVASC) 10 MG tablet Take 10 mg by mouth daily with breakfast.  6  . calcium acetate (PHOSLO) 667 MG capsule Take 1,334 mg by mouth 3 (three) times daily after meals.  6  . cetirizine (ZYRTEC) 10 MG tablet Take 10 mg by mouth daily as needed (for allergies/alternates with singulair).     . clonazePAM (KLONOPIN) 0.5 MG tablet Take 0.5 mg by mouth 2 (two) times daily as needed for anxiety.     . cyclobenzaprine (FLEXERIL) 5 MG tablet Take 1 tablet (5 mg total) by mouth 3 (three) times  daily as needed for muscle spasms. 30 tablet 0  . docusate sodium (COLACE) 100 MG capsule Take 2 capsules (200 mg total) by mouth 2 (two) times daily. (Patient taking differently: Take 200 mg by mouth 2 (two) times daily as needed (for constipation). ) 10 capsule 0  . loperamide (IMODIUM) 2 MG capsule Take 2-4 mg by mouth 3 (three) times daily as needed for diarrhea or loose stools.    . metoprolol succinate (TOPROL-XL) 50 MG 24 hr tablet Take 50 mg by mouth at bedtime.  0  . montelukast (SINGULAIR) 10 MG tablet Take 10 mg by mouth daily.  1  . multivitamin (RENA-VIT) TABS tablet Take 1 tablet by mouth daily. 30 tablet 0  . oxyCODONE-acetaminophen (ROXICET) 5-325 MG tablet Take 1 tablet by mouth every 6 (six) hours as needed for severe pain. 8 tablet 0  . pantoprazole (PROTONIX) 40 MG tablet Take 40 mg by mouth daily before breakfast.  6  . phenylephrine (NEO-SYNEPHRINE) 0.5 % nasal solution Place 1 drop into both nostrils every 6 (six) hours as needed for congestion. 15 mL 1  . ranitidine (ZANTAC) 150 MG tablet Take 150 mg by mouth daily as needed for heartburn.     No current facility-administered medications for this visit.  ROS:  See HPI  Physical Exam:  Vitals:   11/02/16 1433  BP: 137/81  Pulse: 70  Resp: 18  Temp: 99 F (37.2 C)  SpO2: 100%    Incision:  Healing nicely.  Extremities:  +thrill and bruit in fistula;    Assessment/Plan:  This is a 61 y.o. female who is s/p: superficialization of the left arm fistula  -fistula is working well and incisions are healing nicely.  Will be able to stick the fistula in a couple of weeks.  After it has been used three times successfully, we will remove her catheter.  We will need to check a CBC prior to removing her catheter due to her hx of thrombocytopenia. -if she continues to have issues with tiredness and soreness in her legs, we can re-evaluate with a venous reflux study.  She will contact us if this worsens.     Leontine Locket, PA-C [facility] [phone]  Clinic MD:  Pt seen and examined with Dr. Donnetta Hutching

## 2016-11-17 ENCOUNTER — Other Ambulatory Visit: Payer: Self-pay

## 2016-11-17 DIAGNOSIS — M79604 Pain in right leg: Secondary | ICD-10-CM

## 2016-11-17 DIAGNOSIS — M79605 Pain in left leg: Principal | ICD-10-CM

## 2016-11-22 ENCOUNTER — Other Ambulatory Visit (INDEPENDENT_AMBULATORY_CARE_PROVIDER_SITE_OTHER): Payer: BLUE CROSS/BLUE SHIELD

## 2016-11-22 DIAGNOSIS — D696 Thrombocytopenia, unspecified: Secondary | ICD-10-CM | POA: Diagnosis not present

## 2016-11-22 DIAGNOSIS — D594 Other nonautoimmune hemolytic anemias: Secondary | ICD-10-CM

## 2016-11-23 ENCOUNTER — Telehealth: Payer: Self-pay | Admitting: *Deleted

## 2016-11-23 LAB — CBC WITH DIFFERENTIAL/PLATELET
BASOS ABS: 0 10*3/uL (ref 0.0–0.2)
Basos: 1 %
EOS (ABSOLUTE): 0 10*3/uL (ref 0.0–0.4)
Eos: 1 %
HEMATOCRIT: 33 % — AB (ref 34.0–46.6)
HEMOGLOBIN: 10.7 g/dL — AB (ref 11.1–15.9)
IMMATURE GRANULOCYTES: 0 %
Immature Grans (Abs): 0 10*3/uL (ref 0.0–0.1)
LYMPHS ABS: 1.3 10*3/uL (ref 0.7–3.1)
LYMPHS: 33 %
MCH: 28.2 pg (ref 26.6–33.0)
MCHC: 32.4 g/dL (ref 31.5–35.7)
MCV: 87 fL (ref 79–97)
MONOCYTES: 14 %
Monocytes Absolute: 0.5 10*3/uL (ref 0.1–0.9)
NEUTROS PCT: 51 %
Neutrophils Absolute: 2 10*3/uL (ref 1.4–7.0)
Platelets: 218 10*3/uL (ref 150–379)
RBC: 3.79 x10E6/uL (ref 3.77–5.28)
RDW: 17.7 % — ABNORMAL HIGH (ref 12.3–15.4)
WBC: 3.8 10*3/uL (ref 3.4–10.8)

## 2016-11-23 LAB — RETICULOCYTES: RETIC CT PCT: 0.7 % (ref 0.6–2.6)

## 2016-11-23 NOTE — Telephone Encounter (Signed)
-----   Message from Katie Belt, MD sent at 11/23/2016 11:19 AM EST ----- Call pt: platelet count now normal at 218,000 and no signs of blood breakdown

## 2016-11-23 NOTE — Telephone Encounter (Signed)
Called pt - no answer; left message "platelet count now normal at 218,000 and no signs of blood breakdown" per Dr Beryle Beams. And call for any questions.

## 2016-11-29 ENCOUNTER — Ambulatory Visit: Payer: BLUE CROSS/BLUE SHIELD | Admitting: Oncology

## 2016-12-06 ENCOUNTER — Encounter: Payer: Self-pay | Admitting: Oncology

## 2016-12-06 ENCOUNTER — Telehealth: Payer: Self-pay | Admitting: *Deleted

## 2016-12-06 ENCOUNTER — Other Ambulatory Visit: Payer: Self-pay

## 2016-12-06 ENCOUNTER — Ambulatory Visit (INDEPENDENT_AMBULATORY_CARE_PROVIDER_SITE_OTHER): Payer: BLUE CROSS/BLUE SHIELD | Admitting: Oncology

## 2016-12-06 VITALS — BP 137/71 | HR 78 | Temp 98.6°F | Ht 66.0 in | Wt 176.6 lb

## 2016-12-06 DIAGNOSIS — D594 Other nonautoimmune hemolytic anemias: Secondary | ICD-10-CM | POA: Diagnosis not present

## 2016-12-06 DIAGNOSIS — N041 Nephrotic syndrome with focal and segmental glomerular lesions: Secondary | ICD-10-CM

## 2016-12-06 DIAGNOSIS — N186 End stage renal disease: Secondary | ICD-10-CM

## 2016-12-06 DIAGNOSIS — Z992 Dependence on renal dialysis: Secondary | ICD-10-CM

## 2016-12-06 DIAGNOSIS — I7300 Raynaud's syndrome without gangrene: Secondary | ICD-10-CM | POA: Diagnosis not present

## 2016-12-06 DIAGNOSIS — Z888 Allergy status to other drugs, medicaments and biological substances status: Secondary | ICD-10-CM

## 2016-12-06 DIAGNOSIS — Z91048 Other nonmedicinal substance allergy status: Secondary | ICD-10-CM | POA: Diagnosis not present

## 2016-12-06 DIAGNOSIS — D696 Thrombocytopenia, unspecified: Secondary | ICD-10-CM

## 2016-12-06 LAB — CBC WITH DIFFERENTIAL/PLATELET
BASOS PCT: 1 %
Basophils Absolute: 0 10*3/uL (ref 0.0–0.1)
EOS ABS: 0 10*3/uL (ref 0.0–0.7)
EOS PCT: 1 %
HCT: 31 % — ABNORMAL LOW (ref 36.0–46.0)
HEMOGLOBIN: 10 g/dL — AB (ref 12.0–15.0)
Lymphocytes Relative: 30 %
Lymphs Abs: 1.4 10*3/uL (ref 0.7–4.0)
MCH: 28.7 pg (ref 26.0–34.0)
MCHC: 32.3 g/dL (ref 30.0–36.0)
MCV: 89.1 fL (ref 78.0–100.0)
Monocytes Absolute: 0.5 10*3/uL (ref 0.1–1.0)
Monocytes Relative: 11 %
NEUTROS PCT: 57 %
Neutro Abs: 2.6 10*3/uL (ref 1.7–7.7)
PLATELETS: 152 10*3/uL (ref 150–400)
RBC: 3.48 MIL/uL — AB (ref 3.87–5.11)
RDW: 17.1 % — ABNORMAL HIGH (ref 11.5–15.5)
WBC: 4.5 10*3/uL (ref 4.0–10.5)

## 2016-12-06 LAB — RETICULOCYTES
RBC.: 3.48 MIL/uL — AB (ref 3.87–5.11)
RETIC CT PCT: 1.1 % (ref 0.4–3.1)
Retic Count, Absolute: 38.3 10*3/uL (ref 19.0–186.0)

## 2016-12-06 LAB — SAVE SMEAR

## 2016-12-06 NOTE — Progress Notes (Signed)
Hematology and Oncology Follow Up Visit  [name] [id number] [birth year] 61 y.o. 12/06/2016 2:36 PM   Principle Diagnosis: Encounter Diagnoses  Name Primary?  . Other non-autoimmune hemolytic anemias ([place]) Yes  . Thrombocytopenia ([place])   Updated clinical summary: Please see previous notes for complete details. 61 year old woman with history of scleroderma who presented in May 2018 with unexplained renal failure, creatinine 4.7.  While under evaluation she had rapidly progressive deterioration in her kidney function and developed the nephrotic syndrome.  She had an abrupt fall in her platelet count from normal as low as 13,000 without a concomitant fall in hemoglobin.  Initial review of the blood film showed polychromasia but no schistocytes.  LDH mildly elevated at 278. ADAM-TS enzyme low normal at 78% of control.  Serial review of blood films now showed presence of numerous schistocytes.  Diagnosis consistent with atypical hemolytic uremic syndrome.  A renal biopsy was done.  This showed end-stage nephropathy: Membranous GN and a collapsing version of focal and segmental glomerular sclerosis with diffuse severe tubulointerstitial scarring. A trial of anti-C5 complement eculizumab was considered but platelet count began to spontaneously improve and renal biopsy was felt to show irreversible kidney damage that could not be salvaged by this treatment.  Platelet count rose to a peak of 59,000 by July 9 at time of hospital discharge on June 27 then up to 82,000 by July 9.  90,000 on September 19.  And there has been a steady improvement with most recent value on November 12 of 218,000.  Interim History: Overall she is doing quite well.  Fluctuating chronic fatigue.  Tolerating dialysis very well.  AV fistula left arm now mature and will be used shortly and the temporary dialysis catheter will be removed.  Problems with fluctuating electrolytes particularly calcium and phosphate being managed by  nephrology. She has had no clinical bleeding or bruising.  She has had no flareup of chronic joint stiffness.  She continues to have intermittent Reynaud's phenomenon.  She had a quiet Thanksgiving with immediate family members.  Her son is a Marine scientist.  Medications: reviewed  Allergies:  Allergies  Allergen Reactions  . Heparin Other (See Comments)    Platelets issues (suspected)  . Savella [Milnacipran Hcl] Palpitations and Other (See Comments)    Unknown  . Tape Rash and Other (See Comments)    Welts-- unsure if it was paper or adhesive tape    Review of Systems: See interim history Remaining ROS negative:   Physical Exam: Blood pressure 137/71, pulse 78, temperature 98.6 F (37 C), temperature source Oral, height 5\' 6"  (1.676 m), weight 176 lb 9.6 oz (80.1 kg), last menstrual period 11/15/2008, SpO2 99 %. Wt Readings from Last 3 Encounters:  12/06/16 176 lb 9.6 oz (80.1 kg)  11/02/16 179 lb (81.2 kg)  08/18/16 180 lb 14.4 oz (82.1 kg)     General appearance: Well-nourished African-American woman HENNT: Pharynx no erythema, exudate, mass, or ulcer. No thyromegaly or thyroid nodules Lymph nodes: No cervical, supraclavicular, or axillary lymphadenopathy Breasts:  Lungs: Vascular catheter right subclavian position.  Clear to auscultation, resonant to percussion throughout Heart: Regular rhythm, no murmur, no gallop, no rub, no click, no edema Abdomen: Soft, nontender, normal bowel sounds, no mass, no organomegaly Extremities: No edema, no calf tenderness, no cyanosis Musculoskeletal: no joint deformities GU:  Vascular: Carotid pulses 2+, no bruits, AV fistula proximal left arm Neurologic: Alert, oriented, PERRLA,  cranial nerves grossly normal, motor strength 5 over 5, reflexes  1+ symmetric, upper body coordination normal, gait normal, Skin: No rash or ecchymosis  Lab Results: CBC W/Diff    Component Value Date/Time   WBC 3.8 11/22/2016 1000   WBC 4.1  07/19/2016 1208   RBC 3.79 11/22/2016 1000   RBC 3.41 (L) 07/19/2016 1208   RBC 3.41 (L) 07/19/2016 1208   HGB 10.7 (L) 11/22/2016 1000   HCT 33.0 (L) 11/22/2016 1000   PLT 218 11/22/2016 1000   MCV 87 11/22/2016 1000   MCH 28.2 11/22/2016 1000   MCH 29.0 07/19/2016 1208   MCHC 32.4 11/22/2016 1000   MCHC 29.1 (L) 07/19/2016 1208   RDW 17.7 (H) 11/22/2016 1000   LYMPHSABS 1.3 11/22/2016 1000   MONOABS 0.4 07/19/2016 1208   EOSABS 0.0 11/22/2016 1000   BASOSABS 0.0 11/22/2016 1000     Chemistry      Component Value Date/Time   NA 140 09/27/2016 1022   K 4.3 09/27/2016 1022   CL 99 (L) 07/07/2016 0916   CO2 29 07/07/2016 0916   BUN 28 (H) 07/07/2016 0916   CREATININE 5.63 (H) 07/07/2016 0916      Component Value Date/Time   CALCIUM 9.1 07/07/2016 0916   ALKPHOS 48 06/25/2016 1458   AST 15 06/25/2016 1458   ALT 11 (L) 06/25/2016 1458   BILITOT 0.3 06/25/2016 1458       Radiological Studies: No results found.  Impression:  I still feel she likely has atypical hemolytic uremic syndrome.  I cannot explain the spontaneous remission.  Initiating trigger unclear but presentation with acute renal failure and a microangiopathic hemolytic anemia supportive of the diagnosis. I am going to see her again in 6 months.  If platelet count remains normal, she can be discharged from our clinic.  CC: Patient Care Team: [name], MD as PCP - General Kennith Center, RD as Dietitian (Family Medicine) Annia Belt, MD as Consulting Physician (Oncology) Fleet Contras, MD as Consulting Physician (Nephrology) Early, Arvilla Meres, MD as Consulting Physician (Vascular Surgery) Center, [facility]   Murriel Hopper, MD, [place]  Hematology-Oncology/Internal Medicine     11/26/20182:36 PM

## 2016-12-06 NOTE — Telephone Encounter (Addendum)
Message from pt - stated she missed her appt w/Dr G last week; dialysis had re-arranged her schedule for Thanksgiving. Wants to know when she needs to re-schedule; likes to be seen before the end of the year.

## 2016-12-06 NOTE — Patient Instructions (Signed)
To lab today Return visit 6 months   CBC,diff day of visit

## 2016-12-07 ENCOUNTER — Telehealth: Payer: Self-pay | Admitting: *Deleted

## 2016-12-07 NOTE — Telephone Encounter (Signed)
-----   Message from Katie Belt, MD sent at 12/06/2016  3:53 PM EST ----- Call pt: platelet count 152,000. Lower than 2 weeks ago but still in normal range.

## 2016-12-07 NOTE — Telephone Encounter (Signed)
Pt called / informed "platelet count 152,000. Lower than 2 weeks ago but still in normal range." per Dr Beryle Beams. Pt's at dialysis.

## 2016-12-08 NOTE — Telephone Encounter (Signed)
Pt was seen Monday afternoon.

## 2016-12-21 ENCOUNTER — Ambulatory Visit: Payer: BLUE CROSS/BLUE SHIELD | Admitting: Vascular Surgery

## 2016-12-21 ENCOUNTER — Encounter: Payer: Self-pay | Admitting: Vascular Surgery

## 2016-12-21 ENCOUNTER — Encounter (HOSPITAL_COMMUNITY): Payer: BLUE CROSS/BLUE SHIELD

## 2016-12-21 ENCOUNTER — Ambulatory Visit (INDEPENDENT_AMBULATORY_CARE_PROVIDER_SITE_OTHER): Payer: BLUE CROSS/BLUE SHIELD | Admitting: Vascular Surgery

## 2016-12-21 ENCOUNTER — Ambulatory Visit (HOSPITAL_COMMUNITY)
Admission: RE | Admit: 2016-12-21 | Discharge: 2016-12-21 | Disposition: A | Discharge status: Home / Self Care | Payer: BLUE CROSS/BLUE SHIELD | Source: Ambulatory Visit | Attending: Vascular Surgery | Admitting: Vascular Surgery

## 2016-12-21 VITALS — BP 163/89 | HR 72 | Temp 98.8°F | Resp 16 | Ht 66.0 in | Wt 173.0 lb

## 2016-12-21 DIAGNOSIS — M79605 Pain in left leg: Secondary | ICD-10-CM | POA: Insufficient documentation

## 2016-12-21 DIAGNOSIS — I8393 Asymptomatic varicose veins of bilateral lower extremities: Secondary | ICD-10-CM | POA: Diagnosis not present

## 2016-12-21 DIAGNOSIS — I87303 Chronic venous hypertension (idiopathic) without complications of bilateral lower extremity: Secondary | ICD-10-CM

## 2016-12-21 DIAGNOSIS — Z992 Dependence on renal dialysis: Secondary | ICD-10-CM

## 2016-12-21 DIAGNOSIS — R609 Edema, unspecified: Secondary | ICD-10-CM | POA: Diagnosis present

## 2016-12-21 DIAGNOSIS — M79604 Pain in right leg: Secondary | ICD-10-CM

## 2016-12-21 DIAGNOSIS — N186 End stage renal disease: Secondary | ICD-10-CM

## 2016-12-21 NOTE — Progress Notes (Signed)
Patient name: Katie Nunez MRN: [id number] DOB: [birth year] Sex: female  REASON FOR VISIT: Evaluation of left upper arm AV fistula and evaluation of heaviness in her lower extremities  HPI: Katie Nunez is a 61 y.o. female well-known to me from prior AV access.  She had cephalic vein superficial mobilization on 09/27/2016.  She has had access of this on several occasions but has had infiltration on 2 separate occasions.  She continues to have her catheter and is increasing the size of her access needles.  She also has a long-standing history of heaviness and tiredness in both lower extremities  Current Outpatient Medications  Medication Sig Dispense Refill  . acetaminophen (TYLENOL) 325 MG tablet Take 650 mg by mouth every 6 (six) hours as needed (for pain/headaches.).    Marland Kitchen amLODipine (NORVASC) 10 MG tablet Take 10 mg by mouth daily with breakfast.  6  . calcium acetate (PHOSLO) 667 MG capsule Take 1,334 mg by mouth 3 (three) times daily after meals.  6  . cetirizine (ZYRTEC) 10 MG tablet Take 10 mg by mouth daily as needed (for allergies/alternates with singulair).     . clonazePAM (KLONOPIN) 0.5 MG tablet Take 0.5 mg by mouth 2 (two) times daily as needed for anxiety.     . cyclobenzaprine (FLEXERIL) 5 MG tablet Take 1 tablet (5 mg total) by mouth 3 (three) times daily as needed for muscle spasms. 30 tablet 0  . docusate sodium (COLACE) 100 MG capsule Take 2 capsules (200 mg total) by mouth 2 (two) times daily. (Patient taking differently: Take 200 mg by mouth 2 (two) times daily as needed (for constipation). ) 10 capsule 0  . loperamide (IMODIUM) 2 MG capsule Take 2-4 mg by mouth 3 (three) times daily as needed for diarrhea or loose stools.    . metoprolol succinate (TOPROL-XL) 50 MG 24 hr tablet Take 50 mg by mouth at bedtime.  0  . montelukast (SINGULAIR) 10 MG tablet Take 10 mg by mouth daily.  1  . multivitamin (RENA-VIT) TABS tablet Take 1 tablet  by mouth daily. 30 tablet 0  . pantoprazole (PROTONIX) 40 MG tablet Take 40 mg by mouth daily before breakfast.  6  . phenylephrine (NEO-SYNEPHRINE) 0.5 % nasal solution Place 1 drop into both nostrils every 6 (six) hours as needed for congestion. 15 mL 1  . ranitidine (ZANTAC) 150 MG tablet Take 150 mg by mouth daily as needed for heartburn.     No current facility-administered medications for this visit.      PHYSICAL EXAM: Vitals:   12/21/16 1213  BP: (!) 163/89  Pulse: 72  Resp: 16  Temp: 98.8 F (37.1 C)  TempSrc: Oral  SpO2: 95%  Weight: 173 lb (78.5 kg)  Height: 5\' 6"  (1.676 m)    GENERAL: The patient is a well-nourished female, in no acute distress. The vital signs are documented above. Left upper arm fistula with excellent thrill.  She does have a 2+ left and right radial pulse.  Lower extremity examination is unchanged with some telangiectasia but no lower extremity swelling   She did have repeat lower extremity venous reflux evaluation today and this showed no evidence of DVT.  Did have some reflux in her great and small saphenous vein with these are not dilated.  MEDICAL ISSUES: Stable overall.  Size maturation of her cephalic vein and hopefully this will be a usable.  Encouraged her to be persistent to have the most talented access staff use her  fistula until it was fully established.  Explained no evidence of DVT and no correctable cause of her lower extremity tiredness.  She is having some mild numbness and tingling in her left hand.  She does have a normal radial pulse but may be demonstrating Terron Merfeld steal symptoms progress.  Otherwise we will see as needed basis   Rosetta Posner, MD [facility] Tel [phone] Pager [phone]

## 2016-12-27 ENCOUNTER — Telehealth (HOSPITAL_COMMUNITY): Payer: Self-pay | Admitting: Family Medicine

## 2016-12-27 ENCOUNTER — Inpatient Hospital Stay (HOSPITAL_COMMUNITY)
Admission: AD | Admit: 2016-12-27 | Discharge: 2016-12-30 | DRG: 813 | Disposition: A | Discharge status: Home / Self Care | Payer: BLUE CROSS/BLUE SHIELD | Source: Ambulatory Visit | Attending: Student in an Organized Health Care Education/Training Program | Admitting: Student in an Organized Health Care Education/Training Program

## 2016-12-27 ENCOUNTER — Other Ambulatory Visit: Payer: Self-pay

## 2016-12-27 ENCOUNTER — Encounter: Payer: Self-pay | Admitting: Oncology

## 2016-12-27 ENCOUNTER — Other Ambulatory Visit: Payer: Self-pay | Admitting: Oncology

## 2016-12-27 ENCOUNTER — Encounter (HOSPITAL_COMMUNITY): Payer: Self-pay | Admitting: General Practice

## 2016-12-27 ENCOUNTER — Ambulatory Visit (INDEPENDENT_AMBULATORY_CARE_PROVIDER_SITE_OTHER): Payer: BLUE CROSS/BLUE SHIELD | Admitting: Oncology

## 2016-12-27 ENCOUNTER — Telehealth: Payer: Self-pay | Admitting: *Deleted

## 2016-12-27 VITALS — BP 167/83 | HR 84 | Temp 98.1°F | Ht 66.0 in | Wt 171.8 lb

## 2016-12-27 DIAGNOSIS — D696 Thrombocytopenia, unspecified: Secondary | ICD-10-CM | POA: Diagnosis present

## 2016-12-27 DIAGNOSIS — D631 Anemia in chronic kidney disease: Secondary | ICD-10-CM | POA: Diagnosis present

## 2016-12-27 DIAGNOSIS — N186 End stage renal disease: Secondary | ICD-10-CM

## 2016-12-27 DIAGNOSIS — Z8672 Personal history of thrombophlebitis: Secondary | ICD-10-CM

## 2016-12-27 DIAGNOSIS — M069 Rheumatoid arthritis, unspecified: Secondary | ICD-10-CM | POA: Diagnosis present

## 2016-12-27 DIAGNOSIS — Z9689 Presence of other specified functional implants: Secondary | ICD-10-CM | POA: Diagnosis not present

## 2016-12-27 DIAGNOSIS — M05719 Rheumatoid arthritis with rheumatoid factor of unspecified shoulder without organ or systems involvement: Secondary | ICD-10-CM

## 2016-12-27 DIAGNOSIS — M349 Systemic sclerosis, unspecified: Secondary | ICD-10-CM

## 2016-12-27 DIAGNOSIS — R202 Paresthesia of skin: Secondary | ICD-10-CM | POA: Diagnosis not present

## 2016-12-27 DIAGNOSIS — I7300 Raynaud's syndrome without gangrene: Secondary | ICD-10-CM | POA: Diagnosis present

## 2016-12-27 DIAGNOSIS — Z79899 Other long term (current) drug therapy: Secondary | ICD-10-CM

## 2016-12-27 DIAGNOSIS — Z888 Allergy status to other drugs, medicaments and biological substances status: Secondary | ICD-10-CM | POA: Diagnosis not present

## 2016-12-27 DIAGNOSIS — F419 Anxiety disorder, unspecified: Secondary | ICD-10-CM | POA: Diagnosis present

## 2016-12-27 DIAGNOSIS — S40021A Contusion of right upper arm, initial encounter: Secondary | ICD-10-CM | POA: Diagnosis not present

## 2016-12-27 DIAGNOSIS — M059 Rheumatoid arthritis with rheumatoid factor, unspecified: Secondary | ICD-10-CM

## 2016-12-27 DIAGNOSIS — Z992 Dependence on renal dialysis: Secondary | ICD-10-CM

## 2016-12-27 DIAGNOSIS — S40012A Contusion of left shoulder, initial encounter: Secondary | ICD-10-CM | POA: Diagnosis not present

## 2016-12-27 DIAGNOSIS — K219 Gastro-esophageal reflux disease without esophagitis: Secondary | ICD-10-CM | POA: Diagnosis present

## 2016-12-27 DIAGNOSIS — Z833 Family history of diabetes mellitus: Secondary | ICD-10-CM

## 2016-12-27 DIAGNOSIS — S40022A Contusion of left upper arm, initial encounter: Secondary | ICD-10-CM | POA: Diagnosis not present

## 2016-12-27 DIAGNOSIS — I341 Nonrheumatic mitral (valve) prolapse: Secondary | ICD-10-CM | POA: Diagnosis present

## 2016-12-27 DIAGNOSIS — M797 Fibromyalgia: Secondary | ICD-10-CM | POA: Diagnosis present

## 2016-12-27 DIAGNOSIS — Z87410 Personal history of cervical dysplasia: Secondary | ICD-10-CM

## 2016-12-27 DIAGNOSIS — I120 Hypertensive chronic kidney disease with stage 5 chronic kidney disease or end stage renal disease: Secondary | ICD-10-CM | POA: Diagnosis present

## 2016-12-27 DIAGNOSIS — N2581 Secondary hyperparathyroidism of renal origin: Secondary | ICD-10-CM | POA: Diagnosis present

## 2016-12-27 DIAGNOSIS — D594 Other nonautoimmune hemolytic anemias: Secondary | ICD-10-CM | POA: Diagnosis not present

## 2016-12-27 DIAGNOSIS — K589 Irritable bowel syndrome without diarrhea: Secondary | ICD-10-CM | POA: Diagnosis present

## 2016-12-27 DIAGNOSIS — X58XXXA Exposure to other specified factors, initial encounter: Secondary | ICD-10-CM | POA: Diagnosis not present

## 2016-12-27 DIAGNOSIS — Z91048 Other nonmedicinal substance allergy status: Secondary | ICD-10-CM

## 2016-12-27 DIAGNOSIS — Z8249 Family history of ischemic heart disease and other diseases of the circulatory system: Secondary | ICD-10-CM

## 2016-12-27 HISTORY — DX: Dependence on renal dialysis: Z99.2

## 2016-12-27 HISTORY — DX: Other allergy status, other than to drugs and biological substances: Z91.09

## 2016-12-27 HISTORY — DX: End stage renal disease: N18.6

## 2016-12-27 LAB — LACTATE DEHYDROGENASE
LDH: 211 U/L — AB (ref 98–192)
LDH: 200 U/L — AB (ref 98–192)

## 2016-12-27 LAB — COMPREHENSIVE METABOLIC PANEL
ALBUMIN: 3.4 g/dL — AB (ref 3.5–5.0)
ALK PHOS: 44 U/L (ref 38–126)
ALT: 18 U/L (ref 14–54)
ANION GAP: 10 (ref 5–15)
AST: 25 U/L (ref 15–41)
BILIRUBIN TOTAL: 0.7 mg/dL (ref 0.3–1.2)
BUN: 45 mg/dL — AB (ref 6–20)
CALCIUM: 9.2 mg/dL (ref 8.9–10.3)
CO2: 27 mmol/L (ref 22–32)
Chloride: 100 mmol/L — ABNORMAL LOW (ref 101–111)
Creatinine, Ser: 9.01 mg/dL — ABNORMAL HIGH (ref 0.44–1.00)
GFR calc Af Amer: 5 mL/min — ABNORMAL LOW (ref >60)
GFR, EST NON AFRICAN AMERICAN: 4 mL/min — AB (ref >60)
GLUCOSE: 96 mg/dL (ref 65–99)
Potassium: 4.5 mmol/L (ref 3.5–5.1)
Sodium: 137 mmol/L (ref 135–145)
TOTAL PROTEIN: 7.5 g/dL (ref 6.5–8.1)

## 2016-12-27 LAB — CBC
HCT: 29.3 % — ABNORMAL LOW (ref 36.0–46.0)
HEMOGLOBIN: 9.4 g/dL — AB (ref 12.0–15.0)
MCH: 28.5 pg (ref 26.0–34.0)
MCHC: 32.1 g/dL (ref 30.0–36.0)
MCV: 88.8 fL (ref 78.0–100.0)
PLATELETS: 24 10*3/uL — AB (ref 150–400)
RBC: 3.3 MIL/uL — AB (ref 3.87–5.11)
RDW: 16.1 % — ABNORMAL HIGH (ref 11.5–15.5)
WBC: 4 10*3/uL (ref 4.0–10.5)

## 2016-12-27 LAB — RETICULOCYTES
RBC.: 3.3 MIL/uL — ABNORMAL LOW (ref 3.87–5.11)
RBC.: 3.3 MIL/uL — ABNORMAL LOW (ref 3.87–5.11)
RETIC COUNT ABSOLUTE: 16.5 10*3/uL — AB (ref 19.0–186.0)
RETIC CT PCT: 0.5 % (ref 0.4–3.1)
Retic Count, Absolute: 19.8 10*3/uL (ref 19.0–186.0)
Retic Ct Pct: 0.6 % (ref 0.4–3.1)

## 2016-12-27 LAB — CBC WITH DIFFERENTIAL/PLATELET
BASOS PCT: 1 %
Basophils Absolute: 0.1 10*3/uL (ref 0.0–0.1)
Eosinophils Absolute: 0.1 10*3/uL (ref 0.0–0.7)
Eosinophils Relative: 2 %
HEMATOCRIT: 29.6 % — AB (ref 36.0–46.0)
HEMOGLOBIN: 9.5 g/dL — AB (ref 12.0–15.0)
LYMPHS PCT: 28 %
Lymphs Abs: 1.1 10*3/uL (ref 0.7–4.0)
MCH: 28.8 pg (ref 26.0–34.0)
MCHC: 32.1 g/dL (ref 30.0–36.0)
MCV: 89.7 fL (ref 78.0–100.0)
MONO ABS: 0.3 10*3/uL (ref 0.1–1.0)
MONOS PCT: 7 %
NEUTROS ABS: 2.5 10*3/uL (ref 1.7–7.7)
NEUTROS PCT: 62 %
Platelets: 28 10*3/uL — CL (ref 150–400)
RBC: 3.3 MIL/uL — ABNORMAL LOW (ref 3.87–5.11)
RDW: 16.2 % — AB (ref 11.5–15.5)
WBC: 4 10*3/uL (ref 4.0–10.5)

## 2016-12-27 LAB — DIRECT ANTIGLOBULIN TEST (NOT AT ARMC)
DAT, IgG: NEGATIVE
DAT, complement: NEGATIVE

## 2016-12-27 LAB — PROTIME-INR
INR: 1.01
PROTHROMBIN TIME: 13.2 s (ref 11.4–15.2)

## 2016-12-27 LAB — SAVE SMEAR

## 2016-12-27 LAB — MRSA PCR SCREENING: MRSA by PCR: NEGATIVE

## 2016-12-27 MED ORDER — SENNOSIDES-DOCUSATE SODIUM 8.6-50 MG PO TABS: 1.0000 | ORAL_TABLET | Freq: Every evening | ORAL | Status: DC | PRN

## 2016-12-27 MED ORDER — CLONAZEPAM 0.5 MG PO TABS
0.5000 mg | ORAL_TABLET | Freq: Two times a day (BID) | ORAL | Status: DC | PRN
  Filled 2016-12-27: qty 1

## 2016-12-27 MED ORDER — METOPROLOL SUCCINATE ER 50 MG PO TB24
50.0000 mg | ORAL_TABLET | Freq: Every day | ORAL | Status: DC
Start: 2016-12-27 — End: 2016-12-30
  Administered 2016-12-27 – 2016-12-29 (×3): 50 mg via ORAL
  Filled 2016-12-27 (×3): qty 1

## 2016-12-27 MED ORDER — NEPRO/CARBSTEADY PO LIQD
237.0000 mL | Freq: Two times a day (BID) | ORAL | Status: DC
  Administered 2016-12-28 – 2016-12-30 (×2): 237 mL via ORAL
  Filled 2016-12-27 (×8): qty 237

## 2016-12-27 MED ORDER — PANTOPRAZOLE SODIUM 40 MG PO TBEC
40.0000 mg | DELAYED_RELEASE_TABLET | Freq: Every day | ORAL | Status: DC
  Administered 2016-12-27: 40 mg via ORAL
  Filled 2016-12-27: qty 1

## 2016-12-27 MED ORDER — DEXAMETHASONE 4 MG PO TABS
20.0000 mg | ORAL_TABLET | Freq: Two times a day (BID) | ORAL | Status: DC
  Administered 2016-12-27 – 2016-12-30 (×6): 20 mg via ORAL
  Filled 2016-12-27 (×6): qty 5

## 2016-12-27 MED ORDER — DEXAMETHASONE SODIUM PHOSPHATE 10 MG/ML IJ SOLN
20.0000 mg | Freq: Two times a day (BID) | INTRAMUSCULAR | Status: DC
Start: 2016-12-27 — End: 2016-12-27
  Filled 2016-12-27: qty 2

## 2016-12-27 MED ORDER — CYCLOBENZAPRINE HCL 5 MG PO TABS: 5.0000 mg | ORAL_TABLET | Freq: Three times a day (TID) | ORAL | Status: DC | PRN

## 2016-12-27 MED ORDER — AMLODIPINE BESYLATE 10 MG PO TABS
10.0000 mg | ORAL_TABLET | Freq: Every day | ORAL | Status: DC
  Administered 2016-12-28 – 2016-12-30 (×3): 10 mg via ORAL
  Filled 2016-12-27: qty 2
  Filled 2016-12-27 (×2): qty 1

## 2016-12-27 MED ORDER — FAMOTIDINE 20 MG PO TABS
10.0000 mg | ORAL_TABLET | ORAL | Status: DC | PRN
  Administered 2016-12-29 (×2): 10 mg via ORAL
  Filled 2016-12-27: qty 1
  Filled 2016-12-27 (×2): qty 0.5

## 2016-12-27 MED ORDER — ACETAMINOPHEN 325 MG PO TABS: 650.0000 mg | ORAL_TABLET | Freq: Four times a day (QID) | ORAL | Status: DC | PRN

## 2016-12-27 MED ORDER — ACETAMINOPHEN 650 MG RE SUPP
650.0000 mg | Freq: Four times a day (QID) | RECTAL | Status: DC | PRN
Start: 2016-12-27 — End: 2016-12-30

## 2016-12-27 MED ORDER — MONTELUKAST SODIUM 10 MG PO TABS
10.0000 mg | ORAL_TABLET | Freq: Every day | ORAL | Status: DC
  Administered 2016-12-27 – 2016-12-30 (×4): 10 mg via ORAL
  Filled 2016-12-27 (×4): qty 1

## 2016-12-27 NOTE — Progress Notes (Signed)
Hematology and Oncology Follow Up Visit  Katie Nunez [id number] [birth year] 61 y.o. 12/27/2016 2:48 PM   Principle Diagnosis: Encounter Diagnoses  Name Primary?  . Rheumatoid arthritis involving shoulder with positive rheumatoid factor, unspecified laterality ([place])   . Thrombocytopenia ([place])   . Scleroderma ([place])   . Other non-autoimmune hemolytic anemias ([place])   . Raynaud's disease without gangrene Yes  Updated clinical summary: 61 year old woman with history of scleroderma manifested primarily by Raynaud's syndrome who presented in May 2018 with unexplained renal failure, creatinine 4.7.  While under evaluation she had rapidly progressive deterioration in her kidney function and developed the nephrotic syndrome.  She was admitted to the hospital on June 15.  Platelet count initially normal. She then had an abrupt fall  to as low as 13,000 without a concomitant fall in hemoglobin.  Initial review of the blood film showed polychromasia but no schistocytes.  LDH mildly elevated at 278. ADAM-TS enzyme low normal at 78% of control.  Serial review of blood films then showed presence of numerous schistocytes.  Diagnosis consistent with atypical hemolytic uremic syndrome.  A renal biopsy was done.  This showed end-stage nephropathy: Membranous GN and a collapsing version of focal and segmental glomerular sclerosis with diffuse severe tubulointerstitial scarring. A trial of anti-C5 complement eculizumab was considered but platelet count began to spontaneously improve and renal biopsy was felt to show irreversible kidney damage that could not be salvaged by this treatment.  Platelet count rose to a peak of 59,000 by July 9 at time of hospital discharge on June 27 then up to 82,000 by July 9.  90,000 on September 19.  And there has been a steady improvement with most recent value on November 12 of 218,000.  Heparin thrombocytopenia testing was done but was negative.  Heparin was resumed with her dialysis  and platelet count continued to come up.  Count as high as 218,000 on November 22, 2016 and 152,000 at time of a recent November 26 visit with me.   Interim History: She is on a Tuesday Thursday Saturday dialysis schedule.  We were contacted by her dialysis center today to report that a CBC done on Thursday last week showed a platelet count of 18,000.  Heparin was discontinued for her dialysis session on Saturday. She reports no acute change.  No signs or symptoms of infection.  She has noted some spontaneous bruising on her right proximal arm and bruising at site of needle puncture for her left brachial AV fistula.  No epistaxis, gum bleeding, hematuria, hematochezia, melena, or vaginal bleeding.  No headache, change in vision, paresthesias other than chronic intermittent paresthesias of her left hand which she was told may be related to the AV fistula.  She has had increasing fatigue.  Ongoing symptoms which she attributes to fibromyalgia with multifocal trigger points.  She has not been started on any new medications.  She had a scheduled fistulogram today to assess patency of her vascular graft.  Fortunately her son drove her to the visit.  Medications: reviewed  Allergies:  Allergies  Allergen Reactions  . Heparin Other (See Comments)    Platelets issues (suspected)  . Savella [Milnacipran Hcl] Palpitations and Other (See Comments)    Unknown  . Tape Rash and Other (See Comments)    Welts-- unsure if it was paper or adhesive tape    Review of Systems: See interim history Remaining ROS negative:   Physical Exam: Blood pressure (!) 167/83, pulse 84, temperature 98.1 F (36.7  C), temperature source Oral, height 5\' 6"  (1.676 m), weight 171 lb 12.8 oz (77.9 kg), last menstrual period 11/15/2008, SpO2 92 %. Wt Readings from Last 3 Encounters:  12/27/16 171 lb 12.8 oz (77.9 kg)  12/21/16 173 lb (78.5 kg)  12/06/16 176 lb 9.6 oz (80.1 kg)     General appearance: Pleasant, articulate,  African-American woman HENNT: Pharynx no erythema, exudate, mass, or ulcer. No thyromegaly or thyroid nodules Lymph nodes: No cervical, supraclavicular, or axillary lymphadenopathy Breasts:  Lungs: Clear to auscultation, resonant to percussion throughout Heart: Regular rhythm, no murmur, no gallop, no rub, no click, no edema Abdomen: Soft, nontender, normal bowel sounds, no mass, no organomegaly Extremities: No edema, no calf tenderness.  There is dusky cyanosis of her fingers bilaterally. Musculoskeletal: no joint deformities GU:  Vascular: Carotid pulses 2+, no bruits, radial pulses 2+ symmetric, right ulnar pulses 1+, left ulnar pulses not palpable,  Neurologic: Alert, oriented, PERRLA, optic discs sharp and vessels normal, no hemorrhage or exudate, cranial nerves grossly normal, motor strength 5 over 5, reflexes 1+ symmetric, upper body coordination normal, gait normal, Skin: No rash or ecchymosis  Lab Results: CBC W/Diff    Component Value Date/Time   WBC 4.0 12/27/2016 1048   RBC 3.30 (L) 12/27/2016 1048   RBC 3.30 (L) 12/27/2016 1048   HGB 9.5 (L) 12/27/2016 1048   HGB 10.7 (L) 11/22/2016 1000   HCT 29.6 (L) 12/27/2016 1048   HCT 33.0 (L) 11/22/2016 1000   PLT 28 (LL) 12/27/2016 1048   PLT 218 11/22/2016 1000   MCV 89.7 12/27/2016 1048   MCV 87 11/22/2016 1000   MCH 28.8 12/27/2016 1048   MCHC 32.1 12/27/2016 1048   RDW 16.2 (H) 12/27/2016 1048   RDW 17.7 (H) 11/22/2016 1000   LYMPHSABS 1.1 12/27/2016 1048   LYMPHSABS 1.3 11/22/2016 1000   MONOABS 0.3 12/27/2016 1048   EOSABS 0.1 12/27/2016 1048   EOSABS 0.0 11/22/2016 1000   BASOSABS 0.1 12/27/2016 1048   BASOSABS 0.0 11/22/2016 1000     Chemistry      Component Value Date/Time   NA 137 12/27/2016 1048   K 4.5 12/27/2016 1048   CL 100 (L) 12/27/2016 1048   CO2 27 12/27/2016 1048   BUN 45 (H) 12/27/2016 1048   CREATININE 9.01 (H) 12/27/2016 1048      Component Value Date/Time   CALCIUM 9.2 12/27/2016 1048    ALKPHOS 44 12/27/2016 1048   AST 25 12/27/2016 1048   ALT 18 12/27/2016 1048   BILITOT 0.7 12/27/2016 1048     LDH 211 lab upper normal 192 previous result in July 12, 1932 Reticulocyte count 0.6%. Normal liver functions  Review of the peripheral blood film: Normochromic normocytic red cells.  Rare spherocyte.  Extremely rare less than 1 per 20 high power fields schistocytes.  (This is within normal limits).  Neutrophils appear hypogranular.  No toxic changes.  Platelets are decreased in number varying from 1-5 per high-power field with estimate 45,000.  Occasional large platelets.  Radiological Studies: No results found.  Impression:  Complicated 61 year old woman with idiopathic end-stage renal disease on dialysis who I initially felt to have the hemolytic uremic syndrome on presentation in June of this year.  Platelet count slowly normalized over time.  Hemoglobin overall stable for a dialysis patient although trend for progressive anemia.  She now presents with an asymptomatic abrupt fall in her platelet count occurring over the last 3 weeks.  She has a borderline elevation of  LDH with a low normal bilirubin and reticulocyte count.  No gross evidence for a microangiopathic hemolytic process on review of the peripheral blood film.  Etiology of the abrupt fall in her platelet count is unclear.  No evidence for heparin related thrombocytopenia  when tested during the initial presentation in May and platelet count rose above normal despite going back on heparin and although it is theoretically possible that she could still be sensitized to the drug over time, I think that this is less likely. In the absence of any obvious microangiopathic process, and in view of an underlying collagen vascular disorder, I would consider treating her for idiopathic immune thrombocytopenia and closely monitor her response to a trial of steroids. Her microangiopathic process did evolve during hospital observation  and may and should still be considered in the differential.   Recommendation: In view of the complexity of the case and concomitant need for dialysis, I believe she should be admitted to the hospital. Repeat heparin thrombocytopenia testing.  I have also sent a sample of blood to [facility] for repeat ADAM-TS enzyme testing which can effectively exclude TTP. I would begin a trial of high-dose dexamethasone 20 mg p.o. twice daily times 4 days.  Compared with traditional dosing of prednisone, this regimen gives a faster and more complete response in  ITP although responses are not more durable. I would check daily CBC, reticulocyte, LDH, and periodic repeat review of the peripheral blood film.  I would check a baseline haptoglobin.  Coombs test.  Baseline coagulation studies. If the LDH would continue to rise or if schistocytes develop on review of serial blood films, then I would strongly consider a trial of eculizumab anticomplement 5a antibody and sent blood to specialty laboratory for assessment of mutations in complement regulatory proteins.     CC: Patient Care Team: [name], MD as PCP - General Kennith Center, RD as Dietitian (Family Medicine) Annia Belt, MD as Consulting Physician (Oncology) Fleet Contras, MD as Consulting Physician (Nephrology) Early, Arvilla Meres, MD as Consulting Physician (Vascular Surgery) Center, Rachel, MD, [place]  Hematology-Oncology/Internal Medicine [phone]    12/17/20182:48 PM

## 2016-12-27 NOTE — Telephone Encounter (Signed)
Call from pt - states went to dialysis on Saturday and her platelets are 19,000. Also notes tiny "dots" bruises on er arms. ANd scheduled for fistulagram today - she will call [facility].

## 2016-12-27 NOTE — Telephone Encounter (Signed)
Planned admission per Dr Beryle Beams for thrombocytopenia. Called pt who's at home Informed of admission. Informed pt to go to Admission office and will be going to 136M-04. Voiced understanding - stated she will pack a few things and will be here in about 30 mins. Report called to [facility], RN on 136M - informed pt will be a direct admit from home.

## 2016-12-27 NOTE — Telephone Encounter (Signed)
I called and spoke with patient and she voiced that she call me back once she spoke with her doctors.

## 2016-12-27 NOTE — Telephone Encounter (Signed)
Talked to Dr Darnell Level -  wants pt to come in to see him today Pt unable to come @ 1330 PM but can come this am. Dr Darnell Level aware - will work her in this am.

## 2016-12-27 NOTE — H&P (Signed)
Date: 12/27/2016               Patient Name:  Katie Nunez MRN: [id number]  DOB: 1955-09-09 Age / Sex: 61 y.o., female   PCP: [name], MD         Medical Service: Internal Medicine Teaching Service         Attending Physician: Dr. Sid Falcon, MD    First Contact: Dr. Berline Lopes Pager: [phone]  Second Contact: Dr. Heber [place] Pager: [phone]       After Hours (After 5p/  First Contact Pager: [phone]  weekends / holidays): Second Contact Pager: [phone]   Chief Complaint: thrombocytopenia   History of Present Illness: Katie Nunez 61 year old female who presents with a notable drop in her platelet count to near 19000 as reported by the patient. (see Dr. Azucena Freed note from same day for additional details). She has a past medical history significant for scleroderma, ESRD on HD, achalasia, IBS, fibromyalgia, HTN, and previous thrombocytopenic event less than 6 months prior. She stated that she had attended her HD session this past Saturday on her T-Th-Sa schedule without event. Although she has felt tired/weak of recent, there have been no other significant changes physically that she is aware of. They were notified of her plt count today prompting repeat labs and admission given the complexity of her illness, severity of her thrombocytopenia and comorbid conditions. The patient has experienced a count of 13K this past June but had recovered reaching a high of >218K on 11/22/2016. This fell to 152K by 11/26 indicating that the decline may have been gradual rather than precipitous.   The patient denied obvious signs of blood loss including hematochezia, melena, vaginal bleeding, gum bleeding, and epistaxis. In addition she denied nausea, vomiting, diarrhea, visual changes, tingling of the left hand(she stated this is likely from the fistula) or chest pain. Patient attested to abdominal pain, generalized fatigue and muscle aches as well as multiple small hematomas on her  skin.  She was directly admitted to the hospital. Plt's today are 28K, with stable H&H of 9.5/29.6 w/ normal WBC 4.0. CMP 137/4.5/100/27/45/9.01/100.   Meds:  No outpatient medications have been marked as taking for the 12/27/16 encounter [facility] Encounter).   Allergies: Allergies as of 12/27/2016 - Review Complete 12/27/2016  Allergen Reaction Noted  . Heparin Other (See Comments) 07/01/2016  . Savella [milnacipran hcl] Palpitations and Other (See Comments) 04/26/2013  . Tape Rash and Other (See Comments) 09/24/2016   Past Medical History:  Diagnosis Date  . Achalasia   . Allergy   . Anxiety   . Dysplasia of cervix, low grade (CIN 1)   . ESRD (end stage renal disease) ([place])    [place]  . Fibromyalgia   . GERD (gastroesophageal reflux disease)   . Gout   . Hypertension   . IBS (irritable bowel syndrome)   . IBS (irritable bowel syndrome)   . MVP (mitral valve prolapse)   . RA (rheumatoid arthritis) ([facility])    FOLLOWED BY DR. SHANAHAN  . Raynaud's disease   . Scleroderma ([place])   . Seasonal allergies   . Thrombocytopenia ([place]) 07/01/2016   Acute fall to 13,000 07/01/16  . Tubular adenoma 01/08/2008   CECUM  . Vitamin D deficiency    Family History:  Family History  Problem Relation Age of Onset  . Hypertension Mother   . Diabetes Mother   . Heart disease Father   . Hypertension Maternal Aunt   .  Diabetes Maternal Grandmother   . Heart disease Paternal Grandfather   . Cerebral palsy Cousin        1ST COUSIN?  . Diabetes Paternal Grandmother    Social History:  Social History   Tobacco Use  . Smoking status: Never Smoker  . Smokeless tobacco: Never Used  Substance Use Topics  . Alcohol use: No  . Drug use: No   Review of Systems: A complete ROS was negative except as per HPI.   Physical Exam: Blood pressure (!) 159/81, pulse 81, temperature 98.4 F (36.9 C), temperature source Oral, resp. rate 16, last menstrual period 11/15/2008, SpO2 91  %. Physical Exam  Constitutional: She is oriented to person, place, and time. She appears well-developed and well-nourished. No distress.  HENT:  Head: Normocephalic and atraumatic.  Eyes: Conjunctivae and EOM are normal.  Cardiovascular: Normal rate and regular rhythm.  No murmur heard. Pulmonary/Chest: Effort normal and breath sounds normal. No stridor. No respiratory distress.  Abdominal: Soft. Bowel sounds are normal. She exhibits no distension. There is no tenderness.  Musculoskeletal: She exhibits no edema or tenderness.  Neurological: She is alert and oriented to person, place, and time.  Skin: Skin is warm. Capillary refill takes less than 2 seconds. She is not diaphoretic.  Psychiatric: She has a normal mood and affect.  Vitals reviewed.  EKG: personally reviewed my interpretation is normal sinus rhythm as of the most recent in 06/2016   CXR: personally reviewed my interpretation is n/a  Assessment & Plan by Problem: Active Problems:   Thrombocytopenia ([employer])  Katie Nunez is a pleasant 61 year old female who presents today for evaluation and treatment of her thrombocytopenia. It was initially thought to be HUS in June of this year. Most recently she has an LDH at upper limit normal, low normal bilirubin and reticulocyte count nor evidence of gross hematuria or hemolytic process with stable Hgb. Previous ADAMT-S13 enzyme testing negative but will repeat, sent to [facility] to exclude TTP. Will repeat heparin thrombocytopenia testing and if all negative would most likely be ITP. Will await results of testing but treat with burst of steroids given the severity of the thrombocytopenia.  Thrombocytopenia: > six month history of fluctuating plt count. With previous negative testing predominantly and former concern for HUS, not repeating testing given presentation. High probability of ITP, but will await final repeat labs.  --Daily CBC for Hgb and plt --Daily reticulocyte  count --Daily LDH for progression of hemolysis --Repeat peripheral blood film in 2-3 days --Treat with dexamethasone 20mg  BID for four days --Gave first dose of dexamethasone 20mg  this pm  ESRD on HD: Thought to most likely be secondary to scleroderma. Bx performed with Dr. Beryle Beams indicating membranous glomerulonephropathy and a collapsing version of focal and segmental glomerular sclerosis with diffuse severe tubulointerstitial scarring. Undergoing hemodialysis given the irreversibility of the kidney damage as indicated on the Bx.  --Nephrology has been consulted (Dr. Posey Pronto) for HD on T-Th-Sa schedule --CMP 137/4.5/100/27/45/9.01/100 --Will repeat CMP/BMP in am  GERD: Hx of reflux.  --Will continue famotidine 10mg  PRN for heartburn.   Fibromyalgia: --Will continue the cyclobenzaprine 5mg  TID  Anxiety: History of significant anxiety. Rarely uses klonopin BID PRN when extreme --Continue clonazepam 0.5mg  PRN twice daily for anxiety  HTN: Often difficutl to control HTN.  --Will continue metoprolol succinate 24 hr 50mg  --Amlodipine 10mg  daily w/ breakfast  Diet: renal diet Code: Full Fluids: n/a Dvt ppx: heparin as per renal Dispo: Admit patient to Inpatient with expected length of  stay greater than 2 midnights.  Signed: Kathi Ludwig, MD 12/27/2016, 5:13 PM  Pager: Pager# [phone]

## 2016-12-28 DIAGNOSIS — N186 End stage renal disease: Secondary | ICD-10-CM

## 2016-12-28 DIAGNOSIS — S40021A Contusion of right upper arm, initial encounter: Secondary | ICD-10-CM

## 2016-12-28 DIAGNOSIS — S40012A Contusion of left shoulder, initial encounter: Secondary | ICD-10-CM

## 2016-12-28 DIAGNOSIS — X58XXXA Exposure to other specified factors, initial encounter: Secondary | ICD-10-CM

## 2016-12-28 LAB — CBC
HCT: 28 % — ABNORMAL LOW (ref 36.0–46.0)
HEMOGLOBIN: 9 g/dL — AB (ref 12.0–15.0)
MCH: 28.3 pg (ref 26.0–34.0)
MCHC: 32.1 g/dL (ref 30.0–36.0)
MCV: 88.1 fL (ref 78.0–100.0)
PLATELETS: 37 10*3/uL — AB (ref 150–400)
RBC: 3.18 MIL/uL — ABNORMAL LOW (ref 3.87–5.11)
RDW: 15.8 % — AB (ref 11.5–15.5)
WBC: 2.4 10*3/uL — ABNORMAL LOW (ref 4.0–10.5)

## 2016-12-28 LAB — DIFFERENTIAL
BASOS ABS: 0 10*3/uL (ref 0.0–0.1)
BASOS PCT: 1 %
EOS ABS: 0 10*3/uL (ref 0.0–0.7)
Eosinophils Relative: 0 %
Lymphocytes Relative: 28 %
Lymphs Abs: 0.7 10*3/uL (ref 0.7–4.0)
Monocytes Absolute: 0.1 10*3/uL (ref 0.1–1.0)
Monocytes Relative: 5 %
NEUTROS PCT: 66 %
Neutro Abs: 1.6 10*3/uL — ABNORMAL LOW (ref 1.7–7.7)

## 2016-12-28 LAB — HAPTOGLOBIN: HAPTOGLOBIN: 171 mg/dL (ref 34–200)

## 2016-12-28 LAB — RETICULOCYTES
RBC.: 3.18 MIL/uL — AB (ref 3.87–5.11)
RETIC CT PCT: 0.7 % (ref 0.4–3.1)
Retic Count, Absolute: 22.3 10*3/uL (ref 19.0–186.0)

## 2016-12-28 LAB — RENAL FUNCTION PANEL
ALBUMIN: 3.5 g/dL (ref 3.5–5.0)
Anion gap: 15 (ref 5–15)
BUN: 76 mg/dL — AB (ref 6–20)
CHLORIDE: 101 mmol/L (ref 101–111)
CO2: 21 mmol/L — ABNORMAL LOW (ref 22–32)
CREATININE: 11.7 mg/dL — AB (ref 0.44–1.00)
Calcium: 9.2 mg/dL (ref 8.9–10.3)
GFR calc Af Amer: 4 mL/min — ABNORMAL LOW (ref >60)
GFR, EST NON AFRICAN AMERICAN: 3 mL/min — AB (ref >60)
Glucose, Bld: 141 mg/dL — ABNORMAL HIGH (ref 65–99)
Phosphorus: 7.5 mg/dL — ABNORMAL HIGH (ref 2.5–4.6)
Potassium: 5.1 mmol/L (ref 3.5–5.1)
SODIUM: 137 mmol/L (ref 135–145)

## 2016-12-28 LAB — HEPARIN INDUCED PLATELET AB (HIT ANTIBODY): HEPARIN INDUCED PLT AB: 0.406 {OD_unit} — AB (ref 0.000–0.400)

## 2016-12-28 LAB — TECHNOLOGIST SMEAR REVIEW

## 2016-12-28 LAB — LACTATE DEHYDROGENASE: LDH: 186 U/L (ref 98–192)

## 2016-12-28 MED ORDER — LIDOCAINE-PRILOCAINE 2.5-2.5 % EX CREA: 1.0000 "application " | TOPICAL_CREAM | CUTANEOUS | Status: DC | PRN

## 2016-12-28 MED ORDER — HEPARIN SODIUM (PORCINE) 1000 UNIT/ML DIALYSIS: 1000.0000 [IU] | INTRAMUSCULAR | Status: DC | PRN

## 2016-12-28 MED ORDER — ANTICOAGULANT SODIUM CITRATE 4% (200MG/5ML) IV SOLN
5.0000 mL | Freq: Once | Status: AC
  Administered 2016-12-30: 5 mL via INTRAVENOUS
  Filled 2016-12-28: qty 250
  Filled 2016-12-28: qty 5
  Filled 2016-12-28: qty 250

## 2016-12-28 MED ORDER — PENTAFLUOROPROP-TETRAFLUOROETH EX AERO: 1.0000 "application " | INHALATION_SPRAY | CUTANEOUS | Status: DC | PRN

## 2016-12-28 MED ORDER — DARBEPOETIN ALFA 100 MCG/0.5ML IJ SOSY: 100.0000 ug | PREFILLED_SYRINGE | INTRAMUSCULAR | Status: DC

## 2016-12-28 MED ORDER — SODIUM CHLORIDE 0.9 % IV SOLN: 100.0000 mL | INTRAVENOUS | Status: DC | PRN

## 2016-12-28 MED ORDER — LIDOCAINE HCL (PF) 1 % IJ SOLN: 5.0000 mL | INTRAMUSCULAR | Status: DC | PRN

## 2016-12-28 MED ORDER — ALTEPLASE 2 MG IJ SOLR: 2.0000 mg | Freq: Once | INTRAMUSCULAR | Status: DC | PRN

## 2016-12-28 MED ORDER — FERRIC CITRATE 1 GM 210 MG(FE) PO TABS
420.0000 mg | ORAL_TABLET | Freq: Three times a day (TID) | ORAL | Status: DC
  Administered 2016-12-29: 420 mg via ORAL
  Filled 2016-12-28 (×5): qty 2

## 2016-12-28 NOTE — Progress Notes (Signed)
   Subjective: The patient was sitting on the edge of her bed today upon entering the room. She denied acute complaints stating that she would like to be able to ambulate the halls if possible to which we informed her that there did not appear to be a contraindication to this, granted she is cautious and stays in her wing of the hospital. She was amenable to the treatment plan and glad to hear of the improvement in her plt count. She denied nausea, vomiting, chest pain, abdominal pain and stated that her fatigue had improved slightly.  Objective:  Vital signs in last 24 hours: Vitals:   12/27/16 1615 12/27/16 2058 12/28/16 0454 12/28/16 0745  BP: (!) 159/81 (!) 145/76 123/70 136/71  Pulse: 81 86 75 72  Resp: 16 18 17 17   Temp: 98.4 F (36.9 C) 98.4 F (36.9 C) 97.9 F (36.6 C) 98 F (36.7 C)  TempSrc: Oral Oral Oral Oral  SpO2: 91% 92% 95% 94%  Weight:  171 lb 8.3 oz (77.8 kg)    Height:    5\' 4"  (1.626 m)   ROS negative except as per HPI.  Physical Exam  Constitutional: She appears well-developed and well-nourished. No distress.  Cardiovascular: Normal rate.  No murmur heard. Pulmonary/Chest: Effort normal. No respiratory distress.  Abdominal: Bowel sounds are normal. She exhibits no distension.  Musculoskeletal: She exhibits no edema or tenderness.  Neurological: She is alert.  Skin: No rash noted. No erythema.  Notable hematomas of the left shoulder and proximal right arm from mild trauma. Improving  Vitals reviewed.  Assessment/Plan:  Active Problems:   Thrombocytopenia ([facility])  Thrombocytopenia: Greater than a six month history of fluctuating plt count. With previous negative testing predominantly and former concern for HUS, repeating testing given presentation. High probability of ITP, but will await final repeat labs.  --Daily CBC for Hgb and plt --Daily reticulocyte count --Daily LDH for progression of hemolysis --Repeat peripheral blood film in 2-3 days --Treat  with dexamethasone 20mg  BID for four days --Gave first dose of dexamethasone 20mg  this pm  ESRD on HD: Thought to most likely be secondary to scleroderma. Bx performed with Dr. Beryle Beams indicating membranous glomerulonephropathy and a collapsing version of focal and segmental glomerular sclerosis with diffuse severe tubulointerstitial scarring. Undergoing hemodialysis given the irreversibility of the kidney damage as indicated on the Bx.  --Nephrology has been consulted for HD on T-Th-Sa schedule --Will repeat BMP following dialysis   GERD: Hx of reflux.  --Will continue famotidine 10mg  PRN for heartburn. No changes today.  Fibromyalgia: --Will continue the cyclobenzaprine 5mg  TID. No pain overnight.  Anxiety: History of significant anxiety. Rarely uses klonopin BID PRN when extreme --Continue clonazepam 0.5mg  PRN twice daily for anxiety --Anxiety is controlled  HTN: Often difficutl to control HTN.  --Will continue metoprolol succinate 24 hr 50mg  --Amlodipine 10mg  daily w/ breakfast  Diet: renal diet Code: Full Fluids: n/a Dvt ppx: heparin as per renal Dispo: Anticipated discharge in approximately 3 day(s).   Kathi Ludwig, MD 12/28/2016, 11:56 AM Pager: Pager# [phone]

## 2016-12-28 NOTE — Consult Note (Signed)
Referring Provider: No ref. provider found Primary Care Physician:  [name], MD Primary Nephrologist:    Reason for Consultation:  ESRD medical management  Volume control  Anemia  HPI: 25 scleroderma and TTP was admitted for thrombocytopenia and being treated with steroids She has ESRD and cannulation of fistula in L arm complicated by hematoma  She has a catheter    Low platelets since June 15 th    No blood loss no rash petechia   SW [place]    ESRD secondary to scleroderma   She runs  4 15   She has no bruising    Past Medical History:  Diagnosis Date  . Achalasia   . Anxiety   . Dysplasia of cervix, low grade (CIN 1)   . Environmental allergies    "all year long" (12/27/2016)  . ESRD (end stage renal disease) on dialysis [facility])    "TTS; [place]" (12/27/2016)  . Fibromyalgia   . GERD (gastroesophageal reflux disease)   . Gout   . Hypertension   . IBS (irritable bowel syndrome)   . MVP (mitral valve prolapse)   . RA (rheumatoid arthritis) (HCC)    FOLLOWED BY DR. SHANAHAN  . Raynaud's disease   . Scleroderma ([place])   . Seasonal allergies   . Thrombocytopenia ([place]) 07/01/2016   Acute fall to 13,000 07/01/16  . Tubular adenoma 01/08/2008   CECUM  . Vitamin D deficiency     Past Surgical History:  Procedure Laterality Date  . ANKLE FRACTURE SURGERY Right   . AV FISTULA PLACEMENT Left 06/28/2016   Procedure: left arm ARTERIOVENOUS (AV) FISTULA CREATION;  Surgeon: Rosetta Posner, MD;  Location: [place];  Service: Vascular;  Laterality: Left;  . BASCILIC VEIN TRANSPOSITION Left 09/27/2016   Procedure: LEFT UPPER ARM CEPHALIC VEIN TRANSPOSITION;  Surgeon: Rosetta Posner, MD;  Location: [place];  Service: Vascular;  Laterality: Left;  . BREAST BIOPSY     "? side"  . CESAREAN SECTION  1994  . CO2 LASER OF CERVIX    . COLONOSCOPY W/ BIOPSIES  01/08/2008  . INSERTION OF DIALYSIS CATHETER Right 06/28/2016   Procedure: INSERTION OF DIALYSIS CATHETER, right  internal jugular;  Surgeon: Rosetta Posner, MD;  Location: [place];  Service: Vascular;  Laterality: Right;  . MYOMECTOMY    . PELVIC LAPAROSCOPY  2011  . superficial thrombophlebitis Left 07-2014    Prior to Admission medications   Medication Sig Start Date End Date Taking? Authorizing Provider  acetaminophen (TYLENOL) 500 MG tablet Take 500-1,000 mg by mouth every 6 (six) hours as needed (for pain/headaches.).    Yes [provider]  allopurinol (ZYLOPRIM) 100 MG tablet Take 100 mg by mouth daily. as directed 11/23/16  Yes [provider]  amLODipine (NORVASC) 10 MG tablet Take 10 mg by mouth daily with breakfast. 09/14/16  Yes [provider]  azelastine (ASTELIN) 0.1 % nasal spray Place 1 spray into both nostrils daily. 12/24/16  Yes [provider]  cetirizine (ZYRTEC) 10 MG tablet Take 10 mg by mouth daily as needed (for allergies/alternates with singulair).    Yes [provider]  clonazePAM (KLONOPIN) 0.5 MG tablet Take 0.5 mg by mouth 2 (two) times daily as needed for anxiety.    Yes [provider]  cyclobenzaprine (FLEXERIL) 5 MG tablet Take 1 tablet (5 mg total) by mouth 3 (three) times daily as needed for muscle spasms. Patient taking differently: Take 10 mg by mouth 3 (  three) times daily as needed for muscle spasms.  07/07/16  Yes Theodis Blaze, MD  docusate sodium (COLACE) 100 MG capsule Take 2 capsules (200 mg total) by mouth 2 (two) times daily. Patient taking differently: Take 200 mg by mouth 2 (two) times daily as needed (for constipation).  07/07/16  Yes Theodis Blaze, MD  fluticasone [facility]) 50 MCG/ACT nasal spray Place 1 spray into the nose daily. 12/15/09  Yes [provider]  loperamide (IMODIUM) 2 MG capsule Take 2-4 mg by mouth 3 (three) times daily as needed for diarrhea or loose stools.   Yes [provider]  metoprolol succinate (TOPROL-XL) 50 MG 24 hr tablet Take 50 mg by mouth at bedtime. 08/24/16   Yes [provider]  multivitamin (RENA-VIT) TABS tablet Take 1 tablet by mouth daily. 07/08/16  Yes Theodis Blaze, MD  pantoprazole (PROTONIX) 40 MG tablet Take 40 mg by mouth daily. 11/11/16  Yes [provider]  ranitidine (ZANTAC) 150 MG tablet Take 150 mg by mouth daily as needed for heartburn.   Yes [provider]  levocetirizine (XYZAL) 5 MG tablet Take 5 mg by mouth daily. 12/24/16   [provider]  montelukast (SINGULAIR) 10 MG tablet Take 10 mg by mouth daily. 08/27/16   [provider]  phenylephrine (NEO-SYNEPHRINE) 0.5 % nasal solution Place 1 drop into both nostrils every 6 (six) hours as needed for congestion. Patient not taking: Reported on 12/27/2016 07/07/16   Theodis Blaze, MD    Current Facility-Administered Medications  Medication Dose Route Frequency Provider Last Rate Last Dose  . acetaminophen (TYLENOL) tablet 650 mg  650 mg Oral Q6H PRN Valinda Party, DO       Or  . acetaminophen (TYLENOL) suppository 650 mg  650 mg Rectal Q6H PRN Kalman Shan Ratliff, DO      . amLODipine (NORVASC) tablet 10 mg  10 mg Oral Q breakfast Kalman Shan Ratliff, DO   10 mg at 12/28/16 9811  . clonazePAM (KLONOPIN) tablet 0.5 mg  0.5 mg Oral BID PRN Kalman Shan Ratliff, DO      . cyclobenzaprine (FLEXERIL) tablet 5 mg  5 mg Oral TID PRN Kalman Shan Ratliff, DO      . dexamethasone (DECADRON) tablet 20 mg  20 mg Oral Q12H Hoffman, Jessica Ratliff, DO   20 mg at 12/28/16 9147  . famotidine (PEPCID) tablet 10 mg  10 mg Oral PRN Kalman Shan Ratliff, DO      . feeding supplement (NEPRO CARB STEADY) liquid 237 mL  237 mL Oral BID BM Gilles Chiquito B, MD      . metoprolol succinate (TOPROL-XL) 24 hr tablet 50 mg  50 mg Oral QHS Kalman Shan Ratliff, DO   50 mg at 12/27/16 2120  . montelukast (SINGULAIR) tablet 10 mg  10 mg Oral Daily Kalman Shan Ratliff, DO   10 mg at 12/28/16 8295  . senna-docusate (Senokot-S)  tablet 1 tablet  1 tablet Oral QHS PRN Valinda Party, DO        Allergies as of 12/27/2016 - Review Complete 12/27/2016  Allergen Reaction Noted  . Heparin Other (See Comments) 07/01/2016  . Savella [milnacipran hcl] Palpitations and Other (See Comments) 04/26/2013  . Tape Rash and Other (See Comments) 09/24/2016    Family History  Problem Relation Age of Onset  . Hypertension Mother   . Diabetes Mother   . Heart disease Father   . Hypertension Maternal Aunt   .  Diabetes Maternal Grandmother   . Heart disease Paternal Grandfather   . Cerebral palsy Cousin        1ST COUSIN?  . Diabetes Paternal Grandmother     Social History   Socioeconomic History  . Marital status: Married    Spouse name: Not on file  . Number of children: Not on file  . Years of education: Not on file  . Highest education level: Not on file  Social Needs  . Financial resource strain: Not on file  . Food insecurity - worry: Not on file  . Food insecurity - inability: Not on file  . Transportation needs - medical: Not on file  . Transportation needs - non-medical: Not on file  Occupational History  . Not on file  Tobacco Use  . Smoking status: Never Smoker  . Smokeless tobacco: Never Used  Substance and Sexual Activity  . Alcohol use: No  . Drug use: No  . Sexual activity: Not Currently    Birth control/protection: Post-menopausal  Other Topics Concern  . Not on file  Social History Narrative  . Not on file    Review of Systems: Gen: Denies any fever, chills, sweats, anorexia, fatigue, weakness, malaise, weight loss, and sleep disorder HEENT: No visual complaints, No history of Retinopathy. Normal external appearance No Epistaxis or Sore throat. No sinusitis.   CV: Denies chest pain, angina, palpitations, syncope, orthopnea, PND, peripheral edema, and claudication. Resp: Denies dyspnea at rest, dyspnea with exercise, cough, sputum, wheezing, coughing up blood, and pleurisy. GI:  Denies vomiting blood, jaundice, and fecal incontinence.   Denies dysphagia or odynophagia. GU : Denies urinary burning, blood in urine, urinary frequency, urinary hesitancy, nocturnal urination, and urinary incontinence.  No renal calculi. MS: Denies joint pain, limitation of movement, and swelling, stiffness, low back pain, extremity pain. Denies muscle weakness, cramps, atrophy.  No use of non steroidal antiinflammatory drugs. Derm: Denies rash, itching, dry skin, hives, moles, warts, or unhealing ulcers.  Psych: Denies depression, anxiety, memory loss, suicidal ideation, hallucinations, paranoia, and confusion. Heme: Denies bruising, bleeding, and enlarged lymph nodes. Neuro: No headache.  No diplopia. No dysarthria.  No dysphasia.  No history of CVA.  No Seizures. No paresthesias.  No weakness. Endocrine No DM.  No Thyroid disease.  No Adrenal disease.  Physical Exam: Vital signs in last 24 hours: Temp:  [97.9 F (36.6 C)-98.4 F (36.9 C)] 98 F (36.7 C) (12/18 0745) Pulse Rate:  [72-86] 72 (12/18 0745) Resp:  [16-18] 17 (12/18 0745) BP: (123-159)/(70-81) 136/71 (12/18 0745) SpO2:  [91 %-95 %] 94 % (12/18 0745) Weight:  [171 lb 8.3 oz (77.8 kg)] 171 lb 8.3 oz (77.8 kg) (12/17 2058) Last BM Date: 12/27/16 General:   Alert,  Well-developed, well-nourished, pleasant and cooperative in NAD Head:  Normocephalic and atraumatic. Eyes:  Sclera clear, no icterus.   Conjunctiva pink. Ears:  Normal auditory acuity. Nose:  No deformity, discharge,  or lesions. Mouth:  No deformity or lesions, dentition normal. Neck:  Supple; no masses or thyromegaly. JVP not elevated Lungs:  Clear throughout to auscultation.   No wheezes, crackles, or rhonchi. No acute distress. Heart:  Regular rate and rhythm; no murmurs, clicks, rubs,  or gallops. Abdomen:  Soft, nontender and nondistended. No masses, hepatosplenomegaly or hernias noted. Normal bowel sounds, without guarding, and without rebound.   Msk:   Symmetrical without gross deformities. Normal posture. Pulses:  No carotid, renal, femoral bruits. DP and PT symmetrical and equal Extremities:  Without clubbing  or edema. Neurologic:  Alert and  oriented x4;  grossly normal neurologically. Skin:  Intact without significant lesions or rashes. Cervical Nodes:  No significant cervical adenopathy. Psych:  Alert and cooperative. Normal mood and affect.  Intake/Output from previous day: 12/17 0701 - 12/18 0700 In: 240 [P.O.:240] Out: 0  Intake/Output this shift: Total I/O In: 400 [P.O.:400] Out: -   Lab Results: Recent Labs    12/27/16 1048 12/27/16 1649 12/28/16 0657  WBC 4.0 4.0 2.4*  HGB 9.5* 9.4* 9.0*  HCT 29.6* 29.3* 28.0*  PLT 28* 24* 37*   BMET Recent Labs    12/27/16 1048  NA 137  K 4.5  CL 100*  CO2 27  GLUCOSE 96  BUN 45*  CREATININE 9.01*  CALCIUM 9.2   LFT Recent Labs    12/27/16 1048  PROT 7.5  ALBUMIN 3.4*  AST 25  ALT 18  ALKPHOS 44  BILITOT 0.7   PT/INR Recent Labs    12/27/16 1649  LABPROT 13.2  INR 1.01   Hepatitis Panel No results for input(s): HEPBSAG, HCVAB, HEPAIGM, HEPBIGM in the last 72 hours.  Studies/Results: No results found.  Assessment/Plan:  ESRD- scleroderma TTS  Dialysis   ANEMIA-  Hb low will check iron stores and start darbepoietin  MBD-  Patient having GI symptoms with phoslo  Will change to Auryxia   HTN/VOL-cotinue to challenge EDW  ACCESS- use catheter with ITP   -- hold of on fisulogram   OTHER- ITP   LOS: 1 Bryli Mantey W @TODAY @1 :04 PM

## 2016-12-29 LAB — CBC
HCT: 28.4 % — ABNORMAL LOW (ref 36.0–46.0)
HEMOGLOBIN: 9.2 g/dL — AB (ref 12.0–15.0)
MCH: 28.4 pg (ref 26.0–34.0)
MCHC: 32.4 g/dL (ref 30.0–36.0)
MCV: 87.7 fL (ref 78.0–100.0)
PLATELETS: 68 10*3/uL — AB (ref 150–400)
RBC: 3.24 MIL/uL — ABNORMAL LOW (ref 3.87–5.11)
RDW: 15.8 % — ABNORMAL HIGH (ref 11.5–15.5)
WBC: 5.5 10*3/uL (ref 4.0–10.5)

## 2016-12-29 LAB — RETICULOCYTES
RBC.: 3.24 MIL/uL — ABNORMAL LOW (ref 3.87–5.11)
RETIC CT PCT: 1.2 % (ref 0.4–3.1)
Retic Count, Absolute: 38.9 10*3/uL (ref 19.0–186.0)

## 2016-12-29 LAB — SAVE SMEAR

## 2016-12-29 LAB — TECHNOLOGIST SMEAR REVIEW

## 2016-12-29 LAB — IRON AND TIBC
Iron: 113 ug/dL (ref 28–170)
SATURATION RATIOS: 37 % — AB (ref 10.4–31.8)
TIBC: 302 ug/dL (ref 250–450)
UIBC: 189 ug/dL

## 2016-12-29 LAB — LACTATE DEHYDROGENASE: LDH: 175 U/L (ref 98–192)

## 2016-12-29 MED ORDER — DOXERCALCIFEROL 4 MCG/2ML IV SOLN
7.0000 ug | INTRAVENOUS | Status: DC
  Administered 2016-12-30: 7 ug via INTRAVENOUS
  Filled 2016-12-29: qty 4

## 2016-12-29 MED ORDER — RENA-VITE PO TABS
1.0000 | ORAL_TABLET | Freq: Every day | ORAL | Status: DC
  Administered 2016-12-29: 1 via ORAL
  Filled 2016-12-29: qty 1

## 2016-12-29 MED ORDER — DARBEPOETIN ALFA 100 MCG/0.5ML IJ SOSY
100.0000 ug | PREFILLED_SYRINGE | INTRAMUSCULAR | Status: DC
  Administered 2016-12-30: 100 ug via INTRAVENOUS
  Filled 2016-12-29: qty 0.5

## 2016-12-29 MED ORDER — FERRIC CITRATE 1 GM 210 MG(FE) PO TABS
210.0000 mg | ORAL_TABLET | Freq: Three times a day (TID) | ORAL | Status: DC
Start: 2016-12-29 — End: 2016-12-30
  Filled 2016-12-29 (×4): qty 1

## 2016-12-29 NOTE — Care Management Note (Signed)
Case Management Note  Patient Details  Name: Katie Nunez MRN: [id number] Date of Birth: 1955-03-25  Subjective/Objective:                 Patient from home w spouse. Greater than asix month history of fluctuating plt count. With previous negative testing predominantly and former concern for HUS, repeating testing given presentation. High probability of ITP, but will await final repeat labs. CM will continue to follow.      Action/Plan:   Expected Discharge Date:                  Expected Discharge Plan:  Home/Self Care  In-House Referral:     Discharge planning Services  CM Consult  Post Acute Care Choice:    Choice offered to:     DME Arranged:    DME Agency:     HH Arranged:    HH Agency:     Status of Service:  In process, will continue to follow  If discussed at Long Length of Stay Meetings, dates discussed:    Additional Comments:  Carles Collet, RN 12/29/2016, 1:52 PM

## 2016-12-29 NOTE — Progress Notes (Signed)
Subjective: The patient was resting in her bed comfortably upon entering the room today. She requested that I speak with her sister on the phone who is a Copywriter, advertising at [employer]. The conversation was brief as she simply wanted to know what her hematological diagnosis was. I briefly explained that we had a lab pending and a slightly positive HIT panel but given her tolerance of heparin and lack of precipitous drop in plt's we are awaiting final labs for diagnosis. In addition I explained to the patient that given the complexity of her illness and inability to definitively determine the source of the thrombocytopenia while in conjunction with her hematomas, ESRD, and scleroderma she would need to stay hospitalized for at least one more day. To this she agreed.  Objective:  Vital signs in last 24 hours: Vitals:   12/28/16 1900 12/28/16 1912 12/28/16 2145 12/29/16 0504  BP: 120/85 (!) 148/79 (!) 144/77 (!) 142/80  Pulse: 86 84 90 76  Resp:  18 18 17   Temp:  97.8 F (36.6 C) 98.3 F (36.8 C) 98.6 F (37 C)  TempSrc:  Oral Oral Oral  SpO2:  95% 96% 97%  Weight:  169 lb 12.1 oz (77 kg) 169 lb 14.4 oz (77.1 kg)   Height:        ROS negative except as per HPI.  Physical Exam  Constitutional: She appears well-developed and well-nourished. No distress.  Cardiovascular: Normal rate and regular rhythm.  No murmur heard. Pulmonary/Chest: Effort normal and breath sounds normal. No respiratory distress.  Abdominal: Soft. Bowel sounds are normal. She exhibits no distension.  Musculoskeletal: She exhibits no edema or tenderness.  Vitals reviewed.    Assessment/Plan:  Active Problems:   Scleroderma (HCC)   Thrombocytopenia (HCC)   ESRD (end stage renal disease) (HCC)  Given the complexity of the patients thrombocytopenia with scleroderma and ESRD on HD as well as the unclear etiology of her thrombocytopenia, she is at increased risk for poor outcome if her condition acutely worsens. A clear  cause need be established prior to discharge given the high concern for a precipitous drop in platelets and her need for heparin while undergoing HD and the concern that this may be HIT vs TTP vs ITP. Awaiting results of outside lab tests. The mildly positive panel is not conclusive for HIT but concerning given the need for anticoagulation during HD and requires additional studies to clearly assess.   Thrombocytopenia: Greater than a six month history of fluctuating plt count. With previous negative testing predominantly and former concern for HUS, repeating testing given presentation. High probability of ITP, but will await final repeat labs.  --HIT panel screen at 0.406 reflexing to confirmatory study but unlikely to be positive given low value.  --Daily CBC for Hgb and plt 5.5/9.2/28.4/68 tdoay --Daily reticulocyte count, 1.2% --Daily LDH for progression of hemolysis, --Repeat peripheral blood film indicated polychromasia, elliptocytes and teardrop cells --Treat with dexamethasone 20mg  BID for four days, on day 2-3 today as she started in the PM --Awaiting ADAMTS-13 testing from [facility].   ESRD on HD: Thought to most likely be secondary to scleroderma. Bx performed with Dr. Beryle Beams indicating membranous glomerulonephropathy and a collapsing version of focal and segmental glomerular sclerosis with diffuse severe tubulointerstitial scarring. Undergoing hemodialysis given the irreversibility of the kidney damage as indicated on the Bx.  --Nephrology has been consulted for HD on T-Th-Sa schedule --Will repeat BMP following dialysis if indicated  GERD: Hx of reflux.  --Will continue famotidine 10mg   PRN for heartburn. No changes today.  Fibromyalgia: --Will continue the cyclobenzaprine 5mg  TID. No pain overnight.  Anxiety: History of significant anxiety. Rarely uses klonopin BID PRN when extreme --Continue clonazepam 0.5mg  PRN twice daily for anxiety --Anxiety is  controlled  HTN: Often difficutl to control HTN. Moderately well controlled today --Will continue metoprolol succinate 24 hr 50mg  --Amlodipine 10mg  daily w/ breakfast  Diet:renal diet Code: Full Fluids:n/a Dvt MVH:QIONGEX as per renal Dispo: Anticipated discharge in approximately 2 day(s).   Kathi Ludwig, MD 12/29/2016, 7:09 AM Pager: Pager# [phone]

## 2016-12-29 NOTE — Progress Notes (Signed)
Nutrition Brief Note  Patient identified on the Malnutrition Screening Tool (MST) Report  Wt Readings from Last 15 Encounters:  12/28/16 169 lb 14.4 oz (77.1 kg)  12/27/16 171 lb 12.8 oz (77.9 kg)  12/21/16 173 lb (78.5 kg)  12/06/16 176 lb 9.6 oz (80.1 kg)  11/02/16 179 lb (81.2 kg)  08/18/16 180 lb 14.4 oz (82.1 kg)  07/26/16 180 lb 3.2 oz (81.7 kg)  07/06/16 184 lb (83.5 kg)  01/27/16 187 lb (84.8 kg)  11/06/15 192 lb 4.8 oz (87.2 kg)  01/14/15 186 lb 9.6 oz (84.6 kg)  07/23/14 176 lb 4.8 oz (80 kg)  01/07/14 182 lb 11.2 oz (82.9 kg)  11/26/13 184 lb 3.2 oz (83.6 kg)  10/09/13 191 lb 1.6 oz (86.7 kg)    Body mass index is 29.16 kg/m. Patient meets criteria for overweight based on current BMI. No significant changes for time frame. Pt with ESRD on HD.   Current diet order is renal with 1.2 L fluid restriction , patient is consuming approximately 75-100% of meals at this time. Pt reports a good appetite currently and a slight decrease in appetite PTA assumed r/t low platelets and phosphorus binders.   Nepro ordered BID, pt consuming, each supplement provides 425 kcal and 19 grams protein. Pt amenable to supplementation and plans to continue upon discharge for elevated protein requirements.   RD promoted adequate calorie and protein consumption. Provided examples of high protein snacks and meal options.  Nutrition-focused physical exam completed. Findings include no fat depletion, no muscle depletion and no edema.  Encouraged pt to follow-up with outpatient RD at HD facility.   Labs and medications reviewed.   No nutrition interventions warranted at this time. If nutrition issues arise, please consult RD.   Parks Ranger, MS, RDN, LDN 12/29/2016 1:17 PM

## 2016-12-29 NOTE — Progress Notes (Signed)
  [facility] Progress Note   Dialysis Orders: 4.25 EDW 78.5 - was 77.6 last post HD 12/15 3 K 2.25 Ca heparin 2500 - AVF up to 350 BFR - but also had order for CKV to eval because of infiltrations right IJ and left upper AVF hectorol 7 Mircera 75 q 4 weeks - last 11/24 no Fe last tsat 29% plts 35 12/6 and 19/12/13 ferritin 1400 10/2016  Assessment/Plan: 1. Thrombocytopenia - plts up to 68 - no heparin HD; work up in progress  2. ESRD -TTS - HD Thursday; holding on F'gram due to low platelets- reschedule after d/c - using catheter with Na citrate block 3. Anemia - hgb 9.2 -tsat 37% - last mircera wa 75 on 11/25 - to start weekly Aranesp 12/20 4. Secondary hyperparathyroidism - on aurixya  (due to GI sx with phoslo)  resume hectorol- given prior intolerance sx, she wants to start with 1 Aurixya ac first 5. HTN/volume - has been leaving below edw - titrating EDW down; continue current meds; net UF 882 Tuesday with post wt 77 6. Nutrition - renal diet - add multivitamin 7. Anxiety 8. Fibromyalgia  Myriam Jacobson, PA-C [facility] Beeper [phone] 12/29/2016,11:33 AM  LOS: 2 days   Subjective:   No c/o  Objective Vitals:   12/28/16 1912 12/28/16 2145 12/29/16 0504 12/29/16 0917  BP: (!) 148/79 (!) 144/77 (!) 142/80 (!) 145/78  Pulse: 84 90 76 80  Resp: 18 18 17 18   Temp: 97.8 F (36.6 C) 98.3 F (36.8 C) 98.6 F (37 C) 98.4 F (36.9 C)  TempSrc: Oral Oral Oral Oral  SpO2: 95% 96% 97% 96%  Weight: 77 kg (169 lb 12.1 oz) 77.1 kg (169 lb 14.4 oz)    Height:       Physical Exam General: well appearing NAD working on computer Heart: RRR Lungs: no rales Abdomen: soft NT Extremities: no LE edema Dialysis Access:  Left upper AVF + bruit - old infiltration right IJ [facility]   Additional Objective Labs: Basic Metabolic Panel: Recent Labs  Lab 12/27/16 1048 12/28/16 1531  NA 137 137  K 4.5 5.1  CL 100* 101  CO2 27 21*  GLUCOSE 96 141*  BUN 45*  76*  CREATININE 9.01* 11.70*  CALCIUM 9.2 9.2  PHOS  --  7.5*   Liver Function Tests: Recent Labs  Lab 12/27/16 1048 12/28/16 1531  AST 25  --   ALT 18  --   ALKPHOS 44  --   BILITOT 0.7  --   PROT 7.5  --   ALBUMIN 3.4* 3.5   Iron Studies:  Recent Labs    12/29/16 0501  IRON 113  TIBC 302   Lab Results  Component Value Date   INR 1.01 12/27/2016   INR 1.12 07/01/2016   INR 1.08 06/26/2016  Medications: . anticoagulant sodium citrate     . amLODipine  10 mg Oral Q breakfast  . [START ON 12/30/2016] darbepoetin (ARANESP) injection - DIALYSIS  100 mcg Intravenous Q Thu-HD  . dexamethasone  20 mg Oral Q12H  . feeding supplement (NEPRO CARB STEADY)  237 mL Oral BID BM  . ferric citrate  420 mg Oral TID WC  . metoprolol succinate  50 mg Oral QHS  . montelukast  10 mg Oral Daily

## 2016-12-30 DIAGNOSIS — S40022A Contusion of left upper arm, initial encounter: Secondary | ICD-10-CM

## 2016-12-30 LAB — CBC
HEMATOCRIT: 26.4 % — AB (ref 36.0–46.0)
Hemoglobin: 8.7 g/dL — ABNORMAL LOW (ref 12.0–15.0)
MCH: 28.9 pg (ref 26.0–34.0)
MCHC: 33 g/dL (ref 30.0–36.0)
MCV: 87.7 fL (ref 78.0–100.0)
PLATELETS: 122 10*3/uL — AB (ref 150–400)
RBC: 3.01 MIL/uL — AB (ref 3.87–5.11)
RDW: 16.3 % — ABNORMAL HIGH (ref 11.5–15.5)
WBC: 8.3 10*3/uL (ref 4.0–10.5)

## 2016-12-30 LAB — RENAL FUNCTION PANEL
Albumin: 3 g/dL — ABNORMAL LOW (ref 3.5–5.0)
Anion gap: 13 (ref 5–15)
BUN: 80 mg/dL — ABNORMAL HIGH (ref 6–20)
CO2: 22 mmol/L (ref 22–32)
Calcium: 8.7 mg/dL — ABNORMAL LOW (ref 8.9–10.3)
Chloride: 101 mmol/L (ref 101–111)
Creatinine, Ser: 8.2 mg/dL — ABNORMAL HIGH (ref 0.44–1.00)
GFR calc Af Amer: 6 mL/min — ABNORMAL LOW (ref >60)
GFR calc non Af Amer: 5 mL/min — ABNORMAL LOW (ref >60)
Glucose, Bld: 135 mg/dL — ABNORMAL HIGH (ref 65–99)
Phosphorus: 7.4 mg/dL — ABNORMAL HIGH (ref 2.5–4.6)
Potassium: 4.7 mmol/L (ref 3.5–5.1)
Sodium: 136 mmol/L (ref 135–145)

## 2016-12-30 LAB — RETICULOCYTES
RBC.: 3.01 MIL/uL — ABNORMAL LOW (ref 3.87–5.11)
RETIC COUNT ABSOLUTE: 45.2 10*3/uL (ref 19.0–186.0)
Retic Ct Pct: 1.5 % (ref 0.4–3.1)

## 2016-12-30 LAB — LACTATE DEHYDROGENASE: LDH: 170 U/L (ref 98–192)

## 2016-12-30 MED ORDER — LIDOCAINE HCL (PF) 1 % IJ SOLN: 5.0000 mL | INTRAMUSCULAR | Status: DC | PRN

## 2016-12-30 MED ORDER — LIDOCAINE-PRILOCAINE 2.5-2.5 % EX CREA: 1.0000 "application " | TOPICAL_CREAM | CUTANEOUS | Status: DC | PRN

## 2016-12-30 MED ORDER — PENTAFLUOROPROP-TETRAFLUOROETH EX AERO: 1.0000 "application " | INHALATION_SPRAY | CUTANEOUS | Status: DC | PRN

## 2016-12-30 MED ORDER — ALTEPLASE 2 MG IJ SOLR: 2.0000 mg | Freq: Once | INTRAMUSCULAR | Status: DC | PRN

## 2016-12-30 MED ORDER — DEXAMETHASONE 4 MG PO TABS: ORAL_TABLET | ORAL | 0 refills | Status: DC

## 2016-12-30 MED ORDER — SODIUM CHLORIDE 0.9 % IV SOLN: 100.0000 mL | INTRAVENOUS | Status: DC | PRN

## 2016-12-30 MED ORDER — DOXERCALCIFEROL 4 MCG/2ML IV SOLN
INTRAVENOUS | Status: AC
  Administered 2016-12-30: 7 ug via INTRAVENOUS
  Filled 2016-12-30: qty 4

## 2016-12-30 MED ORDER — DARBEPOETIN ALFA 100 MCG/0.5ML IJ SOSY
PREFILLED_SYRINGE | INTRAMUSCULAR | Status: AC
  Administered 2016-12-30: 100 ug via INTRAVENOUS
  Filled 2016-12-30: qty 0.5

## 2016-12-30 NOTE — Procedures (Signed)
I was present at this dialysis session. I have reviewed the session itself and made appropriate changes.   PLT up today > 100 on corticosteroids. 2K bath UF goal 1.2L.    Will not use heparin at HD upon discharge.  If HIT is negative we can consider restarting.      Filed Weights   12/28/16 1912 12/28/16 2145 12/29/16 2051  Weight: 77 kg (169 lb 12.1 oz) 77.1 kg (169 lb 14.4 oz) 78 kg (171 lb 15.3 oz)    Recent Labs  Lab 12/28/16 1531  NA 137  K 5.1  CL 101  CO2 21*  GLUCOSE 141*  BUN 76*  CREATININE 11.70*  CALCIUM 9.2  PHOS 7.5*    Recent Labs  Lab 12/27/16 1048  12/28/16 0657 12/29/16 0501 12/30/16 0639  WBC 4.0   < > 2.4* 5.5 8.3  NEUTROABS 2.5  --  1.6*  --   --   HGB 9.5*   < > 9.0* 9.2* 8.7*  HCT 29.6*   < > 28.0* 28.4* 26.4*  MCV 89.7   < > 88.1 87.7 87.7  PLT 28*   < > 37* 68* 122*   < > = values in this interval not displayed.    Scheduled Meds: . amLODipine  10 mg Oral Q breakfast  . darbepoetin (ARANESP) injection - DIALYSIS  100 mcg Intravenous Q Thu-HD  . dexamethasone  20 mg Oral Q12H  . doxercalciferol  7 mcg Intravenous Q T,Th,Sa-HD  . feeding supplement (NEPRO CARB STEADY)  237 mL Oral BID BM  . ferric citrate  210 mg Oral TID WC  . metoprolol succinate  50 mg Oral QHS  . montelukast  10 mg Oral Daily  . multivitamin  1 tablet Oral QHS   Continuous Infusions: . sodium chloride    . sodium chloride    . anticoagulant sodium citrate     PRN Meds:.sodium chloride, sodium chloride, acetaminophen **OR** acetaminophen, alteplase, clonazePAM, cyclobenzaprine, famotidine, lidocaine (PF), lidocaine-prilocaine, pentafluoroprop-tetrafluoroeth, senna-docusate   Pearson Grippe  MD 12/30/2016, 8:27 AM

## 2016-12-30 NOTE — Discharge Instructions (Signed)
Please monitor for signs of bleeding such as nose bleeds, blood in the urine, blood in the stool or dark stool, easy bruising, weakness/fatigue, small red/black spots on your tongue or skin or other concerns then please call [phone].   Dr. Berline Lopes   Thrombocytopenia Thrombocytopenia is a condition in which you have an abnormally decreased number of platelets in your blood. Platelets are also called thrombocytes. Platelets are needed for blood clotting. Some cases of thrombocytopenia are mild while others are more severe. What are the causes? This condition may be caused by:  Decreased production of platelets. This can be caused by: ? Aplastic anemia, in which your bone marrow quits making blood cells. ? Cancer in the bone marrow. ? Use of certain medicines, including chemotherapy. ? Infection in the bone marrow. ? Heavy alcohol consumption.  Increased destruction of platelets. This can be caused by: ? Certain immune diseases. ? Use of certain drugs. ? Certain blood clotting disorders. ? Certain inherited disorders. ? Certain bleeding disorders. ? Pregnancy.  Having an enlarged spleen (hypersplenism). In hypersplenism, the spleen gathers up platelets from circulation. This means that the platelets are not available to help with blood clotting. The spleen can be enlarged because of cirrhosis or other conditions.  What are the signs or symptoms? Symptoms of this condition are side effects of poor blood clotting. They will vary depending on how low the platelet counts are. Symptoms may include:  Abnormal bleeding.  Nosebleeds.  Heavy menstrual periods.  Blood in the urine or stool (feces).  A purplish discoloration in the skin (purpura).  Bruising.  A rash that looks like pinpoint, purplish-red spots (petechiae) on the skin and mucous membranes.  How is this diagnosed? This condition may be diagnosed with blood tests and a physical exam. Sometimes, a sample of bone  marrow may be removed to look for the original cells (megakaryocytes) that make platelets. How is this treated? Treatment for this condition depends on the cause. Treatment options may include:  Treatment of another condition that is causing the low platelet count.  Medicines to help protect your platelets from being destroyed.  A replacement (transfusion) of platelets to stop or prevent bleeding.  Surgery to remove the spleen.  Follow these instructions at home: General instructions  Check your skin and the linings inside your mouth for bruising or bleeding as told by your health care provider.  Check your sputum, urine, and stool for blood as told by your health care provider.  Ask your health care provider if it is okay for you to drink alcohol.  Take over-the-counter and prescription medicines only as told by your health care provider.  Tell all of your health care providers, including dentists and eye doctors, about your condition. Activity  Until your health care provider says it is okay. ? Do not return to any activities that could cause bumps or bruises.  Take extra care not to cut yourself when you shave or when you use scissors, needles, knives, and other tools.  Take extra care not to burn yourself when ironing or cooking. Contact a health care provider if:  You have unexplained bruising. Get help right away if:  You have active bleeding from anywhere on your body.  You have blood in your sputum, urine, or stool. This information is not intended to replace advice given to you by your health care provider. Make sure you discuss any questions you have with your health care provider. Document Released: 12/28/2004 Document Revised: 08/31/2015 Document Reviewed:  07/01/2014 Elsevier Interactive Patient Education  [employer].

## 2016-12-30 NOTE — Progress Notes (Signed)
Subjective: The patient was undergoing HD today during our evaluation. She was pleasantly delighted to see [place] again as per her admission. She stated that she felt poorly but better than she has in the past while undergoing HD. We informed her that the labs were still pending with regard to her HIT positive testing with reflex to serotonin. Informed her that the diagnosis was still pending but when the labs returned would be rather conclusive, as best as possible.   Objective:  Vital signs in last 24 hours: Vitals:   12/30/16 0725 12/30/16 0731 12/30/16 0800 12/30/16 0830  BP: (!) 156/87 (!) 141/90 96/62 (!) 152/84  Pulse: 70 68 67 65  Resp: 18 18    Temp: 97.9 F (36.6 C)     TempSrc: Oral     SpO2: 96%     Weight: 172 lb 6.4 oz (78.2 kg)     Height:       ROS negative except as per HPI.  Physical Exam  Constitutional: She appears well-developed and well-nourished. No distress.  Cardiovascular: Normal rate and normal heart sounds.  No murmur heard. Pulmonary/Chest: Effort normal and breath sounds normal. No respiratory distress.  Abdominal: Soft. Bowel sounds are normal. She exhibits no distension.  Musculoskeletal: She exhibits no edema or tenderness.  Skin:  Multiple hematomas of the right arm, with a single large hematoma on the left, slightly decreased in size, nontender and not enlarging.   Vitals reviewed.   Assessment/Plan:  Active Problems:   Scleroderma (HCC)   Thrombocytopenia (HCC)   ESRD (end stage renal disease) (HCC)  Thrombocytopenia: Patient improving, diagnosis unclear. As per lab, reflex to serotonin release assay no longer a part of the HIT order, as such, a separate order had to be placed today. Patient will most likely be stable for discharge home tomorrow granted we continue to hold heparin at dialysis, her platelet count continues to climb, ADAMTS-13 lab has resulted and the assay is obtained with close outpatient follow-up. Serotonin assay unlikely  to be significantly elevated in setting of borderline HIT antibody test and given the patients history and presentation. 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 Plan to complete treatment and disposition as above.   --HIT panel required serotonin assay, ordered --Daily CBC for Hgb and plt 8.3/8.7/26.4/122 today --Daily reticulocyte count, 1.5% --Daily LDH for progression of hemolysis, 170, stable --Repeat peripheral blood film indicated polychromasia, elliptocytes and teardrop cells again --Treat with dexamethasone 20mg  BID for four days, on day 3 today as she started in the PM day of admission  --Awaiting ADAMTS-13 testing from [facility], Lab informed us they would notify the team with a response to our question regarding the receipt of this information. Will request this again today.  ESRD on TThSa HD: Thought to most likely be secondary to scleroderma. Bx performed with Dr. Beryle Beams indicating membranous glomerulonephropathy and a collapsing version of focal and segmental glomerular sclerosis with diffuse severe tubulointerstitial scarring. Undergoing hemodialysis given the irreversibility of the kidney damage as indicated on the Bx.  --Nephrology has been consulted for HD on T-Th-Sa schedule --Renal function panel slightly worse 136/4.7/101/22/80/8.2/135, undergoing dialysis today during visit.  HTN: Moderately well controlled today. BP 152/84.  --Will continue metoprolol succinate 24hr 50mg  --Amlodipine 10mg  daily w/ breakfast  Anxiety: Well controlled today as per patient. --Continue clonazepam 0.5mg  PRN twice daily for  anxiety  GERD: Hx of reflux.  --Will continue famotidine 10mg  PRN for heartburn.No changes today.  Fibromyalgia: Denied pain today.  --Will continue cyclobenzaprine 5mg  TID. No pain overnight  Dispo:  Anticipated discharge in approximately 0-1 day(s).   Kathi Ludwig, MD 12/30/2016, 8:39 AM Pager: Pager# [phone] with questions

## 2016-12-30 NOTE — Progress Notes (Signed)
Discharge instructions discussed and reviewed with pt, verbalized understanding. Copy of AVS given to pt.

## 2016-12-30 NOTE — Progress Notes (Signed)
[facility] Progress Note  Dialysis Orders: 4.25 EDW 78.5 - was 77.6 last post HD 12/15 3 K 2.25 Ca heparin 2500 - AVF up to 350 BFR - but also had order for CKV to eval because of infiltrations right IJ and left upper AVF hectorol 7 Mircera 75 q 4 weeks - last 11/24 no Fe last tsat 29% plts 35 12/6 and 19/12/13 ferritin 1400 10/2016  Assessment/Plan: 1. Thrombocytopenia - plts up to 28 >37. 68 > 122 - no heparin HD; work up in progress.  Probably ITP. Actual review of dialysis records indicate that she has been getting heparin locks in her dialysis and boluses of heparin 2500 since August (resumed at that time due to neg HIT status)  with normal platelets up until they were drawn 12/6 and found to be low. Plan continue to hold heparin after discharge for now  2. ESRD -TTS - HD Thursday; holding on F'gram last week due to low platelets- reschedule after d/c - using catheter with Na citrate block; chemistries for today pending 3. Anemia - hgb 9.2> 8.7- tsat 37% - last mircera wa 75 on 11/25 - to start weekly Aranesp 12/20.  4. Secondary hyperparathyroidism - on aurixya  (due to GI sx with phoslo)  resume hectorol- given possible Aurixya intolerance - she may hold doses now and resume at home - titrate as  Tolerated. 5. HTN/volume - has been leaving below edw - titrating EDW down; continue current meds; net UF 882 Tuesday with post wt 77- check BP post HD and post wt and see if EDW needs to be lowered more. 6. Nutrition - renal diet - add multivitamin 7. Anxiety 8. Fibromyalgia  [place] [phone] 12/30/2016,8:18 AM  LOS: 3 days   Subjective:   Thinks Aurixya may have caused lose stool (had 2 am yesterday for the first time).  Plans to hold for now and resume after discharge  Objective Vitals:   12/29/16 0917 12/29/16 1831 12/29/16 2051 12/30/16 0542  BP: (!) 145/78 134/75 (!) 123/92 (!) 149/80  Pulse: 80 81 85 73  Resp: 18 16  15 17   Temp: 98.4 F (36.9 C) 98.2 F (36.8 C) 98 F (36.7 C) 98.2 F (36.8 C)  TempSrc: Oral Oral    SpO2: 96% 97% 100% 94%  Weight:   78 kg (171 lb 15.3 oz)   Height:       Physical Exam on HD  General: comfortable Heart: RRR Lungs: clear without rales Abdomen: soft NT Extremities: no LE edema Dialysis Access: resting left upper AVF + bruit s/p infiltrations ; using right TDC IJ   Additional Objective Labs: Basic Metabolic Panel: Recent Labs  Lab 12/27/16 1048 12/28/16 1531  NA 137 137  K 4.5 5.1  CL 100* 101  CO2 27 21*  GLUCOSE 96 141*  BUN 45* 76*  CREATININE 9.01* 11.70*  CALCIUM 9.2 9.2  PHOS  --  7.5*   Liver Function Tests: Recent Labs  Lab 12/27/16 1048 12/28/16 1531  AST 25  --   ALT 18  --   ALKPHOS 44  --   BILITOT 0.7  --   PROT 7.5  --   ALBUMIN 3.4* 3.5   No results for input(s): LIPASE, AMYLASE in the last 168 hours. CBC: Recent Labs  Lab 12/27/16 1048 12/27/16 1649 12/28/16 0657 12/29/16 0501 12/30/16 0639  WBC 4.0 4.0 2.4* 5.5 8.3  NEUTROABS 2.5  --  1.6*  --   --  HGB 9.5* 9.4* 9.0* 9.2* 8.7*  HCT 29.6* 29.3* 28.0* 28.4* 26.4*  MCV 89.7 88.8 88.1 87.7 87.7  PLT 28* 24* 37* 68* 122*   Blood Culture No results found for: SDES, SPECREQUEST, CULT, REPTSTATUS  Cardiac Enzymes: No results for input(s): CKTOTAL, CKMB, CKMBINDEX, TROPONINI in the last 168 hours. CBG: No results for input(s): GLUCAP in the last 168 hours. Iron Studies:  Recent Labs    12/29/16 0501  IRON 113  TIBC 302   Lab Results  Component Value Date   INR 1.01 12/27/2016   INR 1.12 07/01/2016   INR 1.08 06/26/2016   Studies/Results: No results found. Medications: . sodium chloride    . sodium chloride    . anticoagulant sodium citrate     . amLODipine  10 mg Oral Q breakfast  . darbepoetin (ARANESP) injection - DIALYSIS  100 mcg Intravenous Q Thu-HD  . dexamethasone  20 mg Oral Q12H  . doxercalciferol  7 mcg Intravenous Q T,Th,Sa-HD  .  feeding supplement (NEPRO CARB STEADY)  237 mL Oral BID BM  . ferric citrate  210 mg Oral TID WC  . metoprolol succinate  50 mg Oral QHS  . montelukast  10 mg Oral Daily  . multivitamin  1 tablet Oral QHS

## 2016-12-30 NOTE — Discharge Summary (Signed)
Name: Katie Nunez MRN: [id number] DOB: Jun 28, 1955 61 y.o. PCP: [name], MD  Date of Admission: 12/27/2016  3:45 PM Date of Discharge: 12/30/2016 Attending Physician: No att. providers found  Discharge Diagnosis: Active Problems:   Scleroderma ([place])   Thrombocytopenia ([place])   ESRD (end stage renal disease) ([place])  Discharge Medications: Allergies as of 12/30/2016      Reactions   Heparin Other (See Comments)   Platelets issues (suspected)   Savella [milnacipran Hcl] Palpitations, Other (See Comments)   Unknown   Tape Rash, Other (See Comments)   Welts-- unsure if it was paper or adhesive tape      Medication List    STOP taking these medications   pantoprazole 40 MG tablet Commonly known as:  PROTONIX     TAKE these medications   acetaminophen 500 MG tablet Commonly known as:  TYLENOL Take 500-1,000 mg by mouth every 6 (six) hours as needed (for pain/headaches.).   allopurinol 100 MG tablet Commonly known as:  ZYLOPRIM Take 100 mg by mouth daily. as directed   amLODipine 10 MG tablet Commonly known as:  NORVASC Take 10 mg by mouth daily with breakfast.   azelastine 0.1 % nasal spray Commonly known as:  ASTELIN Place 1 spray into both nostrils daily.   cetirizine 10 MG tablet Commonly known as:  ZYRTEC Take 10 mg by mouth daily as needed (for allergies/alternates with singulair).   clonazePAM 0.5 MG tablet Commonly known as:  KLONOPIN Take 0.5 mg by mouth 2 (two) times daily as needed for anxiety.   cyclobenzaprine 5 MG tablet Commonly known as:  FLEXERIL Take 1 tablet (5 mg total) by mouth 3 (three) times daily as needed for muscle spasms. What changed:  how much to take   dexamethasone 4 MG tablet Commonly known as:  DECADRON Take 5 tablets (20mg s) 2 times daily tonight and tomorrow, then take 2 tablets 2 times daily for two additional days.   docusate sodium 100 MG capsule Commonly known as:  COLACE Take 2 capsules (200 mg total) by  mouth 2 (two) times daily. What changed:    when to take this  reasons to take this   fluticasone 50 MCG/ACT nasal spray Commonly known as:  FLONASE Place 1 spray into the nose daily.   levocetirizine 5 MG tablet Commonly known as:  XYZAL Take 5 mg by mouth daily.   loperamide 2 MG capsule Commonly known as:  IMODIUM Take 2-4 mg by mouth 3 (three) times daily as needed for diarrhea or loose stools.   metoprolol succinate 50 MG 24 hr tablet Commonly known as:  TOPROL-XL Take 50 mg by mouth at bedtime.   montelukast 10 MG tablet Commonly known as:  SINGULAIR Take 10 mg by mouth daily.   multivitamin Tabs tablet Take 1 tablet by mouth daily.   phenylephrine 0.5 % nasal solution Commonly known as:  NEO-SYNEPHRINE Place 1 drop into both nostrils every 6 (six) hours as needed for congestion.   ranitidine 150 MG tablet Commonly known as:  ZANTAC Take 150 mg by mouth daily as needed for heartburn.       Disposition and follow-up:   Katie Nunez was discharged from [facility] in Stable condition.  At the hospital follow up visit please address:  1.  The patient will need close follow-up with regard to her thrombocytopenia. The leading diagnosis is ITP at this time but as all are aware this is a diagnosis of exclusion and  as all labs have not resulted, we can not officially state this as the diagnosis. She did respond well to steroids which is indicative of ITP as well.  The patient will need citrate during HD until HIT has been formally excluded with the Serotonin release assay.    2.  Labs / imaging needed at time of follow-up: CBC w/ diff for platelet evaluation   3.  Pending labs/ test needing follow-up: ADAMTS-13, Serotonin release assay  Follow-up Appointments: Follow-up Information    [name], MD Follow up in 1 week(s).   Specialty:  Family Medicine Contact information: [place]  [id number] [address] Hospital Course by problem list: Active Problems:   Scleroderma ([place])   Thrombocytopenia ([place])   ESRD (end stage renal disease) ([place])   1. Thrombocytopenia: Katie Nunez initially presented to the clinic with thrombocytopenia of 19,000.  She was admitted for evaluation and treatment of her thrombocytopenia given the risk of bleed unknown etiology by preventing [place] from dissipating her platelet trend.  The patient greatly improved over the following two days with platelets reaching 122,000 the day of discharge.  ADAMTS-13 lab has not resulted in the serotonin assay is unlikely to be significantly elevated in setting of borderline HIT antibody test. ITP is the most likely diagnosis and nearly determined given response to steroids as well as current evaluation thus far, but unable to definitively diagnosis as such until remained of labs are assessed. Plan to complete treatment and disposition as above. CBC for Hgb and plt8.3/8.7/26.4/122 on the day of discharge. Daily reticulocyte counts remained stable with the most recent at 1.5%. Daily LDH remained unremarkable during admission with most recent at 170. Repeat peripheral blood filmindicated polychromasia, elliptocytes and teardrop cells. Treatment with dexamethasone 20mg  BID for four days was continued on discharge with a short taper as per patients request.   2. ESRD on TThSa HD: Thought to most likely be secondary to scleroderma(see Dr. Azucena Freed clinic note from the day of admission). Biopsy performed as per Dr. Beryle Beams indicating membranous glomerulonephropathy and a collapsing version of focal and segmental glomerular sclerosis with diffuse severe tubulointerstitial scarring. Undergoing hemodialysis given the irreversibility of the kidney damage as indicated by the biopsy. Nephrology was consulted for HD on T-Th-Sa schedule and completed two sessions without heparin. They were notified on the day of  discharge of the plans and stated that they would continue to hold heparin until the serotonin release assay was completed.   3. HTN: Moderately well controlled during the admission with a BP 125/81 on the day of discharge. We continued metoprolol succinate 24hr 50mg  and  amlodipine 10mg  daily w/ breakfast. There were no changes made to her antihypertensive medications.  4. Anxiety: Well controlled during the admission as per patient, continued on clonazepam 0.5mg  PRN twice daily for anxiety.   5. GERD: Held Pantoprazole due to concern for ITP. Continued famotidine 10mg  PRN for heartburn.  Discharge Vitals:   BP 125/81   Pulse 70   Temp 97.9 F (36.6 C) (Oral)   Resp 18   Ht 5\' 4"  (1.626 m)   Wt 169 lb 1.5 oz (76.7 kg)   LMP 11/15/2008   SpO2 96%   BMI 29.02 kg/m   Pertinent Labs, Studies, and Procedures:  CBC Latest Ref Rng & Units 12/30/2016 12/29/2016 12/28/2016  WBC 4.0 - 10.5 K/uL 8.3 5.5 2.4(L)  Hemoglobin 12.0 - 15.0 g/dL 8.7(L) 9.2(L) 9.0(L)  Hematocrit 36.0 - 46.0 % 26.4(L) 28.4(L) 28.0(L)  Platelets 150 - 400 K/uL 122(L) 68(L) 37(L)   CMP Latest Ref Rng & Units 12/30/2016 12/28/2016 12/27/2016  Glucose 65 - 99 mg/dL 135(H) 141(H) 96  BUN 6 - 20 mg/dL 80(H) 76(H) 45(H)  Creatinine 0.44 - 1.00 mg/dL 8.20(H) 11.70(H) 9.01(H)  Sodium 135 - 145 mmol/L 136 137 137  Potassium 3.5 - 5.1 mmol/L 4.7 5.1 4.5  Chloride 101 - 111 mmol/L 101 101 100(L)  CO2 22 - 32 mmol/L 22 21(L) 27  Calcium 8.9 - 10.3 mg/dL 8.7(L) 9.2 9.2  Total Protein 6.5 - 8.1 g/dL - - 7.5  Total Bilirubin 0.3 - 1.2 mg/dL - - 0.7  Alkaline Phos 38 - 126 U/L - - 44  AST 15 - 41 U/L - - 25  ALT 14 - 54 U/L - - 18    Discharge Instructions: Discharge Instructions    Call MD for:  difficulty breathing, headache or visual disturbances   Complete by:  As directed    Call MD for:  persistant dizziness or light-headedness   Complete by:  As directed    Diet - low sodium heart healthy   Complete by:   As directed    Diet - low sodium heart healthy   Complete by:  As directed    Increase activity slowly   Complete by:  As directed    Increase activity slowly   Complete by:  As directed       Signed: Kathi Ludwig, MD 12/31/2016, 8:26 AM   Pager: Pager# [phone]

## 2016-12-31 ENCOUNTER — Telehealth: Payer: Self-pay | Admitting: *Deleted

## 2016-12-31 LAB — MISCELLANEOUS TEST

## 2016-12-31 NOTE — Telephone Encounter (Signed)
Pt has an appt next Friday @ 1330 PM w/Dr Beryle Beams. She asked about labs - informed they can be done same day of visit;stated she's willing to come in few minutes early.

## 2017-01-03 ENCOUNTER — Other Ambulatory Visit: Payer: Self-pay | Admitting: Family Medicine

## 2017-01-03 DIAGNOSIS — M349 Systemic sclerosis, unspecified: Secondary | ICD-10-CM

## 2017-01-04 LAB — SEROTONIN RELEASE ASSAY (SRA): SRA .2 IU/mL UFH Ser-aCnc: <1 % (ref 0–20)

## 2017-01-05 ENCOUNTER — Other Ambulatory Visit: Payer: Self-pay | Admitting: Oncology

## 2017-01-05 DIAGNOSIS — D693 Immune thrombocytopenic purpura: Secondary | ICD-10-CM | POA: Insufficient documentation

## 2017-01-07 ENCOUNTER — Ambulatory Visit (HOSPITAL_COMMUNITY): Payer: BLUE CROSS/BLUE SHIELD | Attending: Internal Medicine

## 2017-01-07 ENCOUNTER — Encounter: Payer: Self-pay | Admitting: Oncology

## 2017-01-07 ENCOUNTER — Other Ambulatory Visit: Payer: Self-pay

## 2017-01-07 ENCOUNTER — Ambulatory Visit (INDEPENDENT_AMBULATORY_CARE_PROVIDER_SITE_OTHER): Payer: BLUE CROSS/BLUE SHIELD | Admitting: Oncology

## 2017-01-07 DIAGNOSIS — D693 Immune thrombocytopenic purpura: Secondary | ICD-10-CM

## 2017-01-07 DIAGNOSIS — Z888 Allergy status to other drugs, medicaments and biological substances status: Secondary | ICD-10-CM | POA: Diagnosis not present

## 2017-01-07 DIAGNOSIS — Z992 Dependence on renal dialysis: Secondary | ICD-10-CM | POA: Diagnosis not present

## 2017-01-07 DIAGNOSIS — I428 Other cardiomyopathies: Secondary | ICD-10-CM

## 2017-01-07 DIAGNOSIS — I371 Nonrheumatic pulmonary valve insufficiency: Secondary | ICD-10-CM | POA: Diagnosis not present

## 2017-01-07 DIAGNOSIS — M797 Fibromyalgia: Secondary | ICD-10-CM | POA: Diagnosis not present

## 2017-01-07 DIAGNOSIS — I071 Rheumatic tricuspid insufficiency: Secondary | ICD-10-CM | POA: Insufficient documentation

## 2017-01-07 DIAGNOSIS — M349 Systemic sclerosis, unspecified: Secondary | ICD-10-CM | POA: Insufficient documentation

## 2017-01-07 DIAGNOSIS — Z91048 Other nonmedicinal substance allergy status: Secondary | ICD-10-CM

## 2017-01-07 DIAGNOSIS — N186 End stage renal disease: Secondary | ICD-10-CM | POA: Insufficient documentation

## 2017-01-07 LAB — CBC WITH DIFFERENTIAL/PLATELET
Basophils Absolute: 0 10*3/uL (ref 0.0–0.1)
Basophils Relative: 0 %
Eosinophils Absolute: 0.3 10*3/uL (ref 0.0–0.7)
Eosinophils Relative: 3 %
HCT: 28.5 % — ABNORMAL LOW (ref 36.0–46.0)
HEMOGLOBIN: 9 g/dL — AB (ref 12.0–15.0)
LYMPHS ABS: 2 10*3/uL (ref 0.7–4.0)
LYMPHS PCT: 23 %
MCH: 29.4 pg (ref 26.0–34.0)
MCHC: 31.6 g/dL (ref 30.0–36.0)
MCV: 93.1 fL (ref 78.0–100.0)
Monocytes Absolute: 0.7 10*3/uL (ref 0.1–1.0)
Monocytes Relative: 8 %
NEUTROS PCT: 66 %
Neutro Abs: 5.8 10*3/uL (ref 1.7–7.7)
Platelets: 102 10*3/uL — ABNORMAL LOW (ref 150–400)
RBC: 3.06 MIL/uL — AB (ref 3.87–5.11)
RDW: 18.7 % — ABNORMAL HIGH (ref 11.5–15.5)
WBC: 8.7 10*3/uL (ref 4.0–10.5)

## 2017-01-07 LAB — RETICULOCYTES
RBC.: 3.06 MIL/uL — ABNORMAL LOW (ref 3.87–5.11)
RETIC CT PCT: 4.2 % — AB (ref 0.4–3.1)
Retic Count, Absolute: 128.5 10*3/uL (ref 19.0–186.0)

## 2017-01-07 LAB — ECHOCARDIOGRAM COMPLETE
Height: 66 in
Weight: 2828.8 oz

## 2017-01-07 LAB — LACTATE DEHYDROGENASE: LDH: 254 U/L — ABNORMAL HIGH (ref 98–192)

## 2017-01-07 NOTE — Addendum Note (Signed)
Addended by: Truddie Crumble on: 01/07/2017 01:48 PM   Modules accepted: Orders

## 2017-01-07 NOTE — Patient Instructions (Signed)
OK to resume heparin with dialysis Your platelet count today is 102,000. We will get weekly blood counts at dialysis center for the next 6 weeks and have results faxed to me [phone]. I will call if we need to make any changes. Return visit w Dr Darnell Level in 4 weeks on 02/04/17

## 2017-01-07 NOTE — Progress Notes (Signed)
Hematology and Oncology Follow Up Visit  Katie Nunez [id number] Dec 12, 1955 61 y.o. 01/07/2017 2:57 PM   Principle Diagnosis: Encounter Diagnosis  Name Primary?  . Acute ITP [facility])   Updated clinical summary: 61 year old woman with history of scleroderma manifested primarily by Raynaud's syndrome who presented in May 2018 with unexplained renal failure, creatinine 4.7. While under evaluation she had rapidly progressive deterioration in her kidney function and developed the nephrotic syndrome.  She was admitted to the hospital on June 15.  Platelet count initially normal.She then had an abrupt fall  to as low as 13,000 without a concomitant fall in hemoglobin. Initial review of the blood film showed polychromasia but no schistocytes. LDH mildly elevated at 278. ADAM-TSenzyme low normal at 78% of control. Serial review of blood films then showed presence of numerous schistocytes. Diagnosis consistent with atypical hemolytic uremic syndrome. A renal biopsy was done. This showed end-stage nephropathy: Membranous GN and a collapsing version of focal and segmental glomerular sclerosis with diffuse severe tubulointerstitial scarring. A trial of anti-C5 complementeculizumabwas considered but platelet count began to spontaneously improve and renal biopsy was felt to show irreversible kidney damage that could not be salvaged by this treatment. Platelet count rose to a peak of 59,000 by July 9 at time of hospital discharge on June 27 then up to 82,000 by July 9. 90,000 on September 19. And there has been a steady improvement with  value on November 12 of 218,000.  Heparin thrombocytopenia testing was done but was negative.  Heparin was resumed with her dialysis and platelet count continued to come up.  Interim History:   I was notified on December 17 that a routine CBC done at her dialysis unit on December 13 had fallen to 18,000.  Most recent value in my record was 152,000 on November 26.  I  had her come in for an evaluation the same day and a repeat platelet count was 28,000.  Hemoglobin 9.5, reticulocyte count 0.6%, haptoglobin 171, LDH borderline elevated at 200, lab normal up to 192.  Review of the peripheral blood film did not reveal any schistocytes or polychromasia.  She was admitted to the hospital for further evaluation.  She had no signs or symptoms of infection.  She had not started any new medications.  Dialysis staff was concerned with possibility that she became sensitized to heparin use with the procedure.  Heparin was held starting on December 14. She had developed some spontaneous bruising on her right arm and a large ecchymosis at site of vascular access on the left.  No other clinical bleeding. No other acute findings on exam. In view of my concern with recent microangiopathic process, an ADAM-TS enzyme level was sent and in the low normal range at 70% of control value.  Review of subsequent blood films showed no serial changes. Direct Coombs test was negative. ELISA for heparin induced platelet factor 4 antibodies was at the borderline of what the reference lab feels is positive at 0.406 lab normal  0.4.  I felt she had a low pretest probability that this was in fact heparin associated antibody formation but we went ahead with a confirmatory serotonin release assay which was negative. 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  A 4-day course of pulse high-dose dexamethasone 20 mg twice daily was administered.  Platelet count rose from a nadir of 24,000 on December 17 up to 122,000 by December 20 and she was discharged.  She has had no issues since hospital discharge.  No epistaxis,  no gum bleeding, no hematuria, no hematochezia or melena, no vaginal bleeding.  No headaches.  No fevers.  She continues to have generalized malaise. Platelet count today is 102,000.  Hemoglobin stable at 9.0.       Medications: reviewed  Allergies:  Allergies  Allergen Reactions   . Heparin Other (See Comments)    Platelets issues (suspected)  . Savella [Milnacipran Hcl] Palpitations and Other (See Comments)    Unknown  . Tape Rash and Other (See Comments)    Welts-- unsure if it was paper or adhesive tape    Review of Systems: See interim history Remaining ROS negative:   Physical Exam: Blood pressure (!) 141/65, pulse 80, temperature 98.6 F (37 C), temperature source Oral, height 5\' 6"  (1.676 m), weight 176 lb 12.8 oz (80.2 kg), last menstrual period 11/15/2008, SpO2 100 %. Wt Readings from Last 3 Encounters:  01/07/17 176 lb 12.8 oz (80.2 kg)  12/30/16 169 lb 1.5 oz (76.7 kg)  12/27/16 171 lb 12.8 oz (77.9 kg)     General appearance: Well-nourished African-American woman HENNT: Pharynx no erythema, exudate, mass, or ulcer. No thyromegaly or thyroid nodules Lymph nodes: No cervical, supraclavicular, or axillary lymphadenopathy Breasts:  Lungs: Clear to auscultation, resonant to percussion throughout Heart: Regular rhythm, no murmur, no gallop, no rub, no click, no edema Abdomen: Soft, nontender, normal bowel sounds, no mass, no organomegaly Extremities: No edema, no calf tenderness Musculoskeletal: no joint deformities GU:  Vascular: Carotid pulses 2+, no bruits, vascular dialysis graft left proximal arm Neurologic: Alert, oriented, PERRLA,   cranial nerves grossly normal, motor strength 5 over 5, reflexes 1+ symmetric, upper body coordination normal, gait normal, Skin: No rash; stable ecchymosis bilateral arms-resolving, no new areas compared with prior exam  Lab Results: CBC W/Diff    Component Value Date/Time   WBC 8.7 01/07/2017 1350   RBC 3.06 (L) 01/07/2017 1350   RBC 3.06 (L) 01/07/2017 1350   HGB 9.0 (L) 01/07/2017 1350   HGB 10.7 (L) 11/22/2016 1000   HCT 28.5 (L) 01/07/2017 1350   HCT 33.0 (L) 11/22/2016 1000   PLT 102 (L) 01/07/2017 1350   PLT 218 11/22/2016 1000   MCV 93.1 01/07/2017 1350   MCV 87 11/22/2016 1000   MCH 29.4  01/07/2017 1350   MCHC 31.6 01/07/2017 1350   RDW 18.7 (H) 01/07/2017 1350   RDW 17.7 (H) 11/22/2016 1000   LYMPHSABS 2.0 01/07/2017 1350   LYMPHSABS 1.3 11/22/2016 1000   MONOABS 0.7 01/07/2017 1350   EOSABS 0.3 01/07/2017 1350   EOSABS 0.0 11/22/2016 1000   BASOSABS 0.0 01/07/2017 1350   BASOSABS 0.0 11/22/2016 1000     Chemistry      Component Value Date/Time   NA 136 12/30/2016 0705   K 4.7 12/30/2016 0705   CL 101 12/30/2016 0705   CO2 22 12/30/2016 0705   BUN 80 (H) 12/30/2016 0705   CREATININE 8.20 (H) 12/30/2016 0705      Component Value Date/Time   CALCIUM 8.7 (L) 12/30/2016 0705   ALKPHOS 44 12/27/2016 1048   AST 25 12/27/2016 1048   ALT 18 12/27/2016 1048   BILITOT 0.7 12/27/2016 1048       Radiological Studies: No results found.  Impression:  1.  Interim development of what appears to be immune thrombocytopenia in a woman with underlying collagen vascular disorder and fibromyalgia who recently developed rapidly progressive end-stage renal disease and is now on dialysis.  Initial suspicion for a microangiopathic process at  time of evaluation in June.  Spontaneous rise in platelet count and stabilization of hemoglobin and precipitating factors for initial microangiopathic process appeared to be transient. Now with sudden unexplained spontaneous fall and platelet count responsive to steroids with no obvious evidence for a microangiopathic process.  Plan is to continue to monitor weekly platelet counts on the short-term.  Second course of dexamethasone if platelet count falls less than 30,000.  If failing this, a trial of Rituxan.  Diagnosis of ITP discussed with the patient.  Other alternative therapies could include either splenectomy or thrombopoietin receptor agonists.  2.  End-stage renal disease on dialysis I will clear her for use of standard heparin with her dialysis treatments at this time and continue to monitor her platelet count closely.  3.  Atypical  collagen vascular disorder scleroderma, Raynaud's phenomenon, positive rheumatoid factor serology, 31 units high positive ANA 1: 1280 centromere pattern both recorded in September 2017.  CC: Patient Care Team: [name], MD as PCP - General Beryle Beams Alyson Locket, MD as Consulting Physician (Hematology) Edrick Oh MD as Consulting Physician (Nephrology) Early, Arvilla Meres, MD as Consulting Physician (Vascular Surgery) Center, [place], [facility], [place], [place]  Total time with direct face-to-face encounter with the patient including counseling and review of data over 40 minutes excluding time for this dictation and review of the peripheral blood film.   Murriel Hopper, MD, [place]  Hematology-Oncology/Internal Medicine     12/28/20182:57 PM

## 2017-01-17 ENCOUNTER — Telehealth: Payer: Self-pay | Admitting: *Deleted

## 2017-01-17 ENCOUNTER — Other Ambulatory Visit: Payer: Self-pay | Admitting: Oncology

## 2017-01-17 MED ORDER — DEXAMETHASONE 4 MG PO TABS: ORAL_TABLET | ORAL | 0 refills | Status: DC

## 2017-01-17 NOTE — Telephone Encounter (Signed)
Pt called / informed platelets are 56 from Dr Etheleen Nicks office. New rx for Decadron 4 mg -Take 5 tablets (20mg s) 2 times daily with meals for 4 days. Total of 8 doses per Dr Beryle Beams sent to her pharmacy. Voiced understanding.

## 2017-02-01 ENCOUNTER — Telehealth: Payer: Self-pay | Admitting: *Deleted

## 2017-02-01 NOTE — Telephone Encounter (Signed)
Received lab results from [place] done 01/27/17 - "call pt, platelet count back to normal @ 199,000" per Dr Beryle Beams. Pt called / informed. Has an appt to see Dr Darnell Level tomorrow.

## 2017-02-02 ENCOUNTER — Ambulatory Visit: Payer: BLUE CROSS/BLUE SHIELD | Admitting: Oncology

## 2017-02-02 ENCOUNTER — Encounter: Payer: Self-pay | Admitting: Oncology

## 2017-02-02 VITALS — BP 150/75 | HR 76 | Temp 98.2°F | Wt 180.0 lb

## 2017-02-02 DIAGNOSIS — Z888 Allergy status to other drugs, medicaments and biological substances status: Secondary | ICD-10-CM

## 2017-02-02 DIAGNOSIS — Z992 Dependence on renal dialysis: Secondary | ICD-10-CM | POA: Diagnosis not present

## 2017-02-02 DIAGNOSIS — N186 End stage renal disease: Secondary | ICD-10-CM

## 2017-02-02 DIAGNOSIS — M05719 Rheumatoid arthritis with rheumatoid factor of unspecified shoulder without organ or systems involvement: Secondary | ICD-10-CM | POA: Diagnosis not present

## 2017-02-02 DIAGNOSIS — Z91048 Other nonmedicinal substance allergy status: Secondary | ICD-10-CM | POA: Diagnosis not present

## 2017-02-02 DIAGNOSIS — D693 Immune thrombocytopenic purpura: Secondary | ICD-10-CM

## 2017-02-02 DIAGNOSIS — Z8739 Personal history of other diseases of the musculoskeletal system and connective tissue: Secondary | ICD-10-CM

## 2017-02-02 NOTE — Progress Notes (Signed)
Hematology and Oncology Follow Up Visit  Katie Nunez [id number] July 24, 1955 62 y.o. 02/02/2017 4:11 PM   Principle Diagnosis: Encounter Diagnoses  Name Primary?  . Rheumatoid arthritis involving shoulder with positive rheumatoid factor, unspecified laterality (Katie Nunez)   . ESRD (end stage renal disease) (Katie Nunez)   . Acute ITP (HCC) Yes  Clinical summary: 62 year old woman with history of sclerodermamanifested primarily by Raynaud's syndromewho presented in May 2018 with unexplained renal failure, creatinine 4.7. While under evaluation she had rapidly progressive deterioration in her kidney function and developed the nephrotic syndrome.She was admitted to the hospital on June 15. Platelet count initially normal.Shethenhad an abrupt fall toas low as 13,000 without a concomitant fall in hemoglobin. Initial review of the blood film showed polychromasia but no schistocytes. LDH mildly elevated at 278. ADAM-TSenzyme low normal at 78% of control. Serial review of blood filmsthenshowed presence of numerous schistocytes. Diagnosis consistent with atypical hemolytic uremic syndrome. A renal biopsy was done. This showed end-stage nephropathy: Membranous GN and a collapsing version of focal and segmental glomerular sclerosis with diffuse severe tubulointerstitial scarring. A trial of anti-C5 complementeculizumabwas considered but platelet count began to spontaneously improve and renal biopsy was felt to show irreversible kidney damage that could not be salvaged by this treatment. Platelet count rose to a peak of 59,000 by July 9 at time of hospital discharge on June 27 then up to 82,000 by July 9. 90,000 on September 19. And there has been a steady improvement with  value on November 12 of 218,000.Heparin thrombocytopenia testing was done but was negative. Heparin was resumed with her dialysis and platelet count continued to come up.   Interim History: Short interim follow-up visit.   Unfortunately, her platelet count fell again.  I received blood counts from the outpatient dialysis unit on or about December 21, 2016 and platelet count had fallen to 56,000 as I recall compared with 152,000 done in my office on November 26 just 2 weeks prior.  I had her come into the office.  Repeat CBC on December 17 was 28,000.  Confirmed by repeat and by review of the peripheral blood film.  There was only a rare schistocytes.  Hemoglobin slightly lower than recent baseline.  No evidence for an acute hemolytic process.  I did send off an ADAM-TS enzyme level and it was normal.  I put her back on a another course of high-dose dexamethasone 40 mg x 4 days.  She responded again.  Platelets 122,000 on December 20, 102,000 on December 28.  Counts faxed to [place] yesterday done at the dialysis unit on January 17: 199,000.  Repeat count today to confirm is pending.  No major side effects from the steroids.  Ongoing fatigue.  Fluid retention from the steroids.  She is noting some swelling of her face.  Her temporary right subclavian dialysis catheter has been removed.  Her AV fistula in the left antecubital area is now functioning.   Medications: reviewed  Allergies:  Allergies  Allergen Reactions  . Heparin Other (See Comments)    Platelets issues (suspected)  . Savella [Milnacipran Hcl] Palpitations and Other (See Comments)    Unknown  . Tape Rash and Other (See Comments)    Welts-- unsure if it was paper or adhesive tape    Review of Systems: See interim history She is not having any significant polyarthralgias at this time despite her presumed diagnosis of rheumatoid arthritis. Remaining ROS negative:   Physical Exam: Blood pressure (!) 150/75, pulse 76, temperature 98.2  F (36.8 C), temperature source Oral, weight 180 lb (81.6 kg), last menstrual period 11/15/2008, SpO2 97 %. Wt Readings from Last 3 Encounters:  02/02/17 180 lb (81.6 kg)  01/07/17 176 lb 12.8 oz (80.2 kg)  12/30/16 169 lb  1.5 oz (76.7 kg)     General appearance: Mildly cushingoid face, well-nourished, well dressed, African-American woman HENNT: Pharynx no erythema, exudate, mass, or ulcer. No thyromegaly or thyroid nodules Lymph nodes: No cervical, supraclavicular, or axillary lymphadenopathy Breasts: Lungs: Clear to auscultation, resonant to percussion throughout Heart: Regular rhythm, no murmur, no gallop, no rub, no click, no edema Abdomen: Soft, nontender, normal bowel sounds, no mass, no organomegaly Extremities: No edema, no calf tenderness Musculoskeletal: no joint deformities GU:  Vascular: Carotid pulses 2+, no bruits, Neurologic: Alert, oriented, PERRLA,, cranial nerves grossly normal, motor strength 5 over 5, reflexes 1+ symmetric, upper body coordination normal, gait normal, Skin: No rash or ecchymosis  Lab Results: CBC W/Diff    Component Value Date/Time   WBC 8.7 01/07/2017 1350   RBC 3.06 (L) 01/07/2017 1350   RBC 3.06 (L) 01/07/2017 1350   HGB 9.0 (L) 01/07/2017 1350   HGB 10.7 (L) 11/22/2016 1000   HCT 28.5 (L) 01/07/2017 1350   HCT 33.0 (L) 11/22/2016 1000   PLT 102 (L) 01/07/2017 1350   PLT 218 11/22/2016 1000   MCV 93.1 01/07/2017 1350   MCV 87 11/22/2016 1000   MCH 29.4 01/07/2017 1350   MCHC 31.6 01/07/2017 1350   RDW 18.7 (H) 01/07/2017 1350   RDW 17.7 (H) 11/22/2016 1000   LYMPHSABS 2.0 01/07/2017 1350   LYMPHSABS 1.3 11/22/2016 1000   MONOABS 0.7 01/07/2017 1350   EOSABS 0.3 01/07/2017 1350   EOSABS 0.0 11/22/2016 1000   BASOSABS 0.0 01/07/2017 1350   BASOSABS 0.0 11/22/2016 1000     Chemistry      Component Value Date/Time   NA 136 12/30/2016 0705   K 4.7 12/30/2016 0705   CL 101 12/30/2016 0705   CO2 22 12/30/2016 0705   BUN 80 (H) 12/30/2016 0705   CREATININE 8.20 (H) 12/30/2016 0705      Component Value Date/Time   CALCIUM 8.7 (L) 12/30/2016 0705   ALKPHOS 44 12/27/2016 1048   AST 25 12/27/2016 1048   ALT 18 12/27/2016 1048   BILITOT 0.7  12/27/2016 1048    CBC pending   Radiological Studies: No results found.  Impression: 1.  ITP in the setting of an underlying collagen vascular disorder Prompt response to steroids but response not durable after the initial course.  Now back in a complete response after a second course.  Additional courses will not be tenable.  We discussed that if the platelet count drift down again below 50,000 I will begin a course of Rituxan 375 mg/m IV weekly x4.  This might also help control other manifestations of her collagen vascular disorder.  2.  End-stage renal disease now on dialysis presumed etiology related to collagen vascular disorder.  3.  Atypical collagen vascular disorder.  High positive ANA, positive rheumatoid factor, previous history of scleroderma, Raynaud's phenomena.  Being followed by rheumatologist in [facility].  Not currently on any immunosuppressive drugs except for the recent high-dose steroids given to treat her ITP.   CC: Patient Care Team: [name], MD as PCP - General Beryle Beams Alyson Locket, MD as Consulting Physician (Oncology) Early, Arvilla Meres, MD as Consulting Physician (Vascular Surgery) Center, [facility], Hassell Done, MD as Consulting Physician (Nephrology) Boris Lown,  Broadus John, MD as Referring Physician (Internal Medicine)   Murriel Hopper, MD, [place]  Hematology-Oncology/Internal Medicine     1/23/20194:11 PM

## 2017-02-02 NOTE — Patient Instructions (Signed)
To lab today Return visit 8-10 weeks - lab day of visit

## 2017-02-03 ENCOUNTER — Telehealth: Payer: Self-pay | Admitting: *Deleted

## 2017-02-03 LAB — CBC WITH DIFFERENTIAL/PLATELET
BASOS: 0 %
Basophils Absolute: 0 10*3/uL (ref 0.0–0.2)
EOS (ABSOLUTE): 0 10*3/uL (ref 0.0–0.4)
EOS: 0 %
HEMATOCRIT: 31.3 % — AB (ref 34.0–46.6)
HEMOGLOBIN: 10 g/dL — AB (ref 11.1–15.9)
IMMATURE GRANS (ABS): 0.1 10*3/uL (ref 0.0–0.1)
IMMATURE GRANULOCYTES: 1 %
Lymphocytes Absolute: 0.9 10*3/uL (ref 0.7–3.1)
Lymphs: 13 %
MCH: 30.2 pg (ref 26.6–33.0)
MCHC: 31.9 g/dL (ref 31.5–35.7)
MCV: 95 fL (ref 79–97)
MONOS ABS: 1.6 10*3/uL — AB (ref 0.1–0.9)
Monocytes: 23 %
NEUTROS PCT: 63 %
Neutrophils Absolute: 4.6 10*3/uL (ref 1.4–7.0)
Platelets: 208 10*3/uL (ref 150–379)
RBC: 3.31 x10E6/uL — ABNORMAL LOW (ref 3.77–5.28)
RDW: 19.2 % — AB (ref 12.3–15.4)
WBC: 7.2 10*3/uL (ref 3.4–10.8)

## 2017-02-03 LAB — RETICULOCYTES: Retic Ct Pct: 1.8 % (ref 0.6–2.6)

## 2017-02-03 NOTE — Telephone Encounter (Signed)
Pt called - no answer; left message "platelets confirmed normal at 208,000" per Dr Beryle Beams. And call for any questions.

## 2017-02-03 NOTE — Telephone Encounter (Signed)
-----   Message from Annia Belt, MD sent at 02/03/2017  9:13 AM EST ----- Call pt: platelets confirmed normal at 208,000

## 2017-02-04 ENCOUNTER — Telehealth: Payer: Self-pay | Admitting: *Deleted

## 2017-02-04 LAB — CYCLIC CITRUL PEPTIDE ANTIBODY, IGG/IGA: CYCLIC CITRULLIN PEPTIDE AB: 51 U — AB (ref 0–19)

## 2017-02-04 NOTE — Telephone Encounter (Signed)
-----   Message from Annia Belt, MD sent at 02/04/2017 10:30 AM EST ----- Call pt: test for rheumatoid arthritis is positive

## 2017-02-04 NOTE — Telephone Encounter (Signed)
Pt called / informed "test for rheumatoid arthritis is positive" per Dr Beryle Beams.

## 2017-02-21 ENCOUNTER — Telehealth: Payer: Self-pay | Admitting: *Deleted

## 2017-02-21 NOTE — Telephone Encounter (Signed)
-----   Message from Annia Belt, MD sent at 02/20/2017  3:23 PM EST ----- Please call pt: platelets back to normal ----- Message ----- From: Ebbie Latus, RN Sent: 02/18/2017   4:33 PM To: Annia Belt, MD  Dr Darnell Level Fax from dialysis - lab results done 2/8; Plt 265 Hgb 10.5 WBC 4.31 Place in your mailbox. Thanks [employer]

## 2017-02-21 NOTE — Telephone Encounter (Signed)
Called pt - no answer; left message "platelets back to normal (@265 )" per Dr Beryle Beams. And call for any questions.

## 2017-02-21 NOTE — Telephone Encounter (Signed)
No the iron can improve hemoglobin but has no effect on the platelets

## 2017-02-21 NOTE — Telephone Encounter (Signed)
Call from pt - wanted Dr Darnell Level to be aware she has been receiving iron supplements at her dialysis treatments. Stated she has received 3 or 4 so far; but refused the last one b/c GI problems. Wanted to know if this was an affect on her labs?

## 2017-02-22 NOTE — Telephone Encounter (Signed)
Called pt - no answer; left message "the iron can improve hemoglobin but has no effect on the platelets" per Dr Beryle Beams. And call back for any questions.

## 2017-03-01 ENCOUNTER — Telehealth: Payer: Self-pay

## 2017-03-01 NOTE — Telephone Encounter (Signed)
Called pt - stated labs done at dialysis, platelets down to 51. Stated they will be faxing results.  Informed Dr Darnell Level of pt's platelet count of 51 per pt. Needs pt to come in tomorrow to repeat lab - called pt; no answer; left message.

## 2017-03-01 NOTE — Telephone Encounter (Signed)
Would like a call back

## 2017-03-02 ENCOUNTER — Other Ambulatory Visit (INDEPENDENT_AMBULATORY_CARE_PROVIDER_SITE_OTHER): Payer: BLUE CROSS/BLUE SHIELD

## 2017-03-02 ENCOUNTER — Other Ambulatory Visit: Payer: Self-pay | Admitting: *Deleted

## 2017-03-02 ENCOUNTER — Telehealth: Payer: Self-pay | Admitting: Pharmacist

## 2017-03-02 ENCOUNTER — Other Ambulatory Visit: Payer: Self-pay | Admitting: Oncology

## 2017-03-02 ENCOUNTER — Encounter: Payer: Self-pay | Admitting: Oncology

## 2017-03-02 DIAGNOSIS — D693 Immune thrombocytopenic purpura: Secondary | ICD-10-CM

## 2017-03-02 DIAGNOSIS — M05719 Rheumatoid arthritis with rheumatoid factor of unspecified shoulder without organ or systems involvement: Secondary | ICD-10-CM

## 2017-03-02 LAB — CBC WITH DIFFERENTIAL/PLATELET
BASOS PCT: 1 %
Basophils Absolute: 0 10*3/uL (ref 0.0–0.1)
EOS ABS: 0 10*3/uL (ref 0.0–0.7)
EOS PCT: 0 %
HCT: 35.2 % — ABNORMAL LOW (ref 36.0–46.0)
HEMOGLOBIN: 11.1 g/dL — AB (ref 12.0–15.0)
LYMPHS PCT: 21 %
Lymphs Abs: 0.9 10*3/uL (ref 0.7–4.0)
MCH: 28.8 pg (ref 26.0–34.0)
MCHC: 31.5 g/dL (ref 30.0–36.0)
MCV: 91.4 fL (ref 78.0–100.0)
Monocytes Absolute: 0.4 10*3/uL (ref 0.1–1.0)
Monocytes Relative: 10 %
NEUTROS PCT: 68 %
Neutro Abs: 2.9 10*3/uL (ref 1.7–7.7)
Platelets: 31 10*3/uL — ABNORMAL LOW (ref 150–400)
RBC: 3.85 MIL/uL — AB (ref 3.87–5.11)
RDW: 18.3 % — ABNORMAL HIGH (ref 11.5–15.5)
WBC: 4.2 10*3/uL (ref 4.0–10.5)

## 2017-03-02 LAB — RETICULOCYTES
RBC.: 3.85 MIL/uL — ABNORMAL LOW (ref 3.87–5.11)
RETIC COUNT ABSOLUTE: 53.9 10*3/uL (ref 19.0–186.0)
RETIC CT PCT: 1.4 % (ref 0.4–3.1)

## 2017-03-02 MED ORDER — PREDNISONE 20 MG PO TABS: 60.0000 mg | ORAL_TABLET | Freq: Every day | ORAL | 3 refills | Status: DC

## 2017-03-02 NOTE — Telephone Encounter (Signed)
Lab appt scheduled for today. 

## 2017-03-02 NOTE — Progress Notes (Signed)
Hematology: I called the patient to discuss the fact that her platelet count has fallen again.  I initially saw her back in June 2018 when she presented with acute stage V renal insufficiency and had to be started on dialysis.  A few days after admission she became thrombocytopenic.  Extensive evaluation failed to reveal an exact reason although I was concerned initially that she might have the hemolytic uremic syndrome as there were schistocytes on the peripheral blood film.  Platelet count slowly recovered to normal then suddenly fell again as low as 24,000 by December 17.  Reevaluation did not show evidence for a microangiopathic process.  I elected to treat her as immune thrombocytopenia especially given her underlying history of rheumatoid arthritis.  She had a rapid response to high-dose dexamethasone 20 mg twice daily times 4 days with rise in her platelet count to normal.  Response was not durable.  She received a second course of high-dose Dex and once again had a complete response up to 208,000 by January 23.  Once again response was not durable platelets down to 31,000 again today. Most of the platelet counts have been done outpatient at the dialysis center so they do not appear in the epic flow chart.  I feel at this point that she does have ITP.  She has had rapid and complete responses to steroids but they are not durable.  We discussed at the time of her last visit that the next best available therapy would be a trial of Rituxan anti-CD20 antibody.  This would be given weekly x4.  We can do this as an outpatient.  We went over potential side effects which are primarily infusion reactions with the first treatment. Alternative treatments would be either splenectomy or thrombopoietin agonists.  No one has ever done a head-to-head trial with either of these modalities against Rituxan.  They all have their own particular risk benefit ratios.  I am going to start her on prednisone 60 mg daily.  I  reserved a infusion bed for her at the: Short stay unit to begin Rituxan this Friday morning February 22. I discussed management with her nephrologist.  I attempted to call her primary care physician but she is out of town until next week. Patient has a close friend who is a physician.  She will have her call me to discuss management.

## 2017-03-02 NOTE — Progress Notes (Signed)
Preauth under [employer] pending, will follow up 03/03/17

## 2017-03-02 NOTE — Telephone Encounter (Signed)
Refill Authorization request for   Rituxan

## 2017-03-02 NOTE — Addendum Note (Signed)
Addended by: Truddie Crumble on: 03/02/2017 10:23 AM   Modules accepted: Orders

## 2017-03-03 ENCOUNTER — Telehealth: Payer: Self-pay | Admitting: *Deleted

## 2017-03-03 ENCOUNTER — Other Ambulatory Visit: Payer: BLUE CROSS/BLUE SHIELD

## 2017-03-03 ENCOUNTER — Other Ambulatory Visit (HOSPITAL_COMMUNITY): Payer: Self-pay

## 2017-03-03 NOTE — Telephone Encounter (Signed)
Noted thanks °

## 2017-03-03 NOTE — Telephone Encounter (Signed)
Pt had called - wanting to know when should she start taking Prednisone (had dialysis this am). Talked to Dr Beryle Beams - need to start taking Prednisone today with food.  Called pt back - informed of Dr Synthia Innocent response. Then asked about PA for Rituxan. Talked to Mannie Stabile - stated done and approved. Called pt back- informed of Jen Kim's response. Also instructed to register at Admission prior to her appt time 0800 AM; voiced understanding.

## 2017-03-04 ENCOUNTER — Encounter: Payer: Self-pay | Admitting: Oncology

## 2017-03-04 ENCOUNTER — Ambulatory Visit (HOSPITAL_COMMUNITY)
Admission: RE | Admit: 2017-03-04 | Discharge: 2017-03-04 | Disposition: A | Discharge status: Home / Self Care | Payer: BLUE CROSS/BLUE SHIELD | Source: Ambulatory Visit | Attending: Oncology | Admitting: Oncology

## 2017-03-04 DIAGNOSIS — D693 Immune thrombocytopenic purpura: Secondary | ICD-10-CM | POA: Diagnosis not present

## 2017-03-04 DIAGNOSIS — M05719 Rheumatoid arthritis with rheumatoid factor of unspecified shoulder without organ or systems involvement: Secondary | ICD-10-CM

## 2017-03-04 DIAGNOSIS — D589 Hereditary hemolytic anemia, unspecified: Secondary | ICD-10-CM | POA: Insufficient documentation

## 2017-03-04 MED ORDER — SODIUM CHLORIDE 0.9 % IV SOLN
375.0000 mg/m2 | Freq: Once | INTRAVENOUS | Status: AC
  Administered 2017-03-04: 10:00:00 700 mg via INTRAVENOUS
  Filled 2017-03-04: qty 70

## 2017-03-04 MED ORDER — SODIUM CHLORIDE 0.9 % IV SOLN
INTRAVENOUS | Status: DC
  Administered 2017-03-04: 09:00:00 via INTRAVENOUS

## 2017-03-04 MED ORDER — ACETAMINOPHEN 325 MG PO TABS
650.0000 mg | ORAL_TABLET | Freq: Once | ORAL | Status: AC
  Administered 2017-03-04: 650 mg via ORAL

## 2017-03-04 MED ORDER — DIPHENHYDRAMINE HCL 25 MG PO CAPS
50.0000 mg | ORAL_CAPSULE | Freq: Once | ORAL | Status: AC
  Administered 2017-03-04: 50 mg via ORAL

## 2017-03-04 MED ORDER — ACETAMINOPHEN 325 MG PO TABS
ORAL_TABLET | ORAL | Status: AC
  Administered 2017-03-04: 09:00:00 650 mg via ORAL
  Filled 2017-03-04: qty 2

## 2017-03-04 MED ORDER — DIPHENHYDRAMINE HCL 25 MG PO CAPS
ORAL_CAPSULE | ORAL | Status: AC
  Administered 2017-03-04: 09:00:00 50 mg via ORAL
  Filled 2017-03-04: qty 2

## 2017-03-04 MED ORDER — SODIUM CHLORIDE 0.9 % IV SOLN
375.0000 mg/m2 | Freq: Once | INTRAVENOUS | Status: DC
  Filled 2017-03-04 (×2): qty 70

## 2017-03-04 NOTE — Discharge Instructions (Signed)

## 2017-03-04 NOTE — Progress Notes (Signed)
Hematology: 62 year old woman I have been following since June when she developed unexplained sudden thrombocytopenia during an evaluation for end-stage renal disease now on dialysis. Ultimately diagnosis of ITP established by a very dramatic but short-lived response to steroids.  She is here today to receive her first of 4 weekly planned doses of Rituxan. We reviewed all potential side effects including rare chance of anaphylactic reactions, hepatitis B reactivation, and even more rare, activation of JC virus.  She is hepatitis B negative as recently as February 24, 2017 and has been vaccinated against hepatitis B.  She is hepatitis C negative as well. She agrees to proceed with treatment today.  Husband is here as a witness and support.  I also discussed her status with a good friend of hers who is a gastroenterologist Dr. Marcelyn Ditty.  Brief exam: She is awake, alert, and oriented, no focal neurologic deficits.  Lungs are clear to auscultation and resonant to percussion.  Regular cardiac rhythm no murmur.  Extremities no edema or calf tenderness.  Impression: ITP with suboptimal short duration response to steroids  Plan: Proceed with first dose of Rituxan 375 mg/m with close nursing monitoring.  If she does well with this dose, I will obtain the new formulation which can be given by subcutaneous injection for the next 3 doses. Consent for administration obtained.

## 2017-03-08 ENCOUNTER — Other Ambulatory Visit: Payer: Self-pay | Admitting: Oncology

## 2017-03-08 DIAGNOSIS — D693 Immune thrombocytopenic purpura: Secondary | ICD-10-CM

## 2017-03-08 DIAGNOSIS — M05719 Rheumatoid arthritis with rheumatoid factor of unspecified shoulder without organ or systems involvement: Secondary | ICD-10-CM

## 2017-03-10 ENCOUNTER — Telehealth: Payer: Self-pay | Admitting: *Deleted

## 2017-03-10 NOTE — Telephone Encounter (Signed)
Pt calls and states her ankles and feet are swollen, R moreso than L, cannot distinguish ankle from the rest of her feet. States she had gained a couple of kilos of weight between tues and today per dialysis nurse. She denies shortness of breath, chest pain, h/a, N&V. She states she lowered her prednisone dose from 60 to 40. She is scheduled for her infusion tomorrow at 0800 [facility] at [facility] Pt states she is anxious about this and would like your advice or a call from you [phone]

## 2017-03-10 NOTE — Telephone Encounter (Signed)
Pt called; I left message on her machine

## 2017-03-11 ENCOUNTER — Ambulatory Visit (HOSPITAL_COMMUNITY)
Admission: RE | Admit: 2017-03-11 | Discharge: 2017-03-11 | Disposition: A | Discharge status: Home / Self Care | Payer: Medicare Other | Source: Ambulatory Visit | Attending: Oncology | Admitting: Oncology

## 2017-03-11 DIAGNOSIS — D693 Immune thrombocytopenic purpura: Secondary | ICD-10-CM | POA: Diagnosis present

## 2017-03-11 DIAGNOSIS — M05719 Rheumatoid arthritis with rheumatoid factor of unspecified shoulder without organ or systems involvement: Secondary | ICD-10-CM | POA: Diagnosis present

## 2017-03-11 LAB — CBC WITH DIFFERENTIAL/PLATELET
BASOS ABS: 0 10*3/uL (ref 0.0–0.1)
Basophils Relative: 0 %
EOS ABS: 0 10*3/uL (ref 0.0–0.7)
EOS PCT: 0 %
HCT: 34.9 % — ABNORMAL LOW (ref 36.0–46.0)
Hemoglobin: 11.3 g/dL — ABNORMAL LOW (ref 12.0–15.0)
LYMPHS PCT: 12 %
Lymphs Abs: 1 10*3/uL (ref 0.7–4.0)
MCH: 29.4 pg (ref 26.0–34.0)
MCHC: 32.4 g/dL (ref 30.0–36.0)
MCV: 90.9 fL (ref 78.0–100.0)
Monocytes Absolute: 0.6 10*3/uL (ref 0.1–1.0)
Monocytes Relative: 8 %
Neutro Abs: 6.4 10*3/uL (ref 1.7–7.7)
Neutrophils Relative %: 80 %
PLATELETS: 223 10*3/uL (ref 150–400)
RBC: 3.84 MIL/uL — AB (ref 3.87–5.11)
RDW: 18.2 % — ABNORMAL HIGH (ref 11.5–15.5)
WBC: 8 10*3/uL (ref 4.0–10.5)

## 2017-03-11 MED ORDER — RITUXIMAB-HYALURONIDASE HUMAN 1400-23400 MG -UT/11.7ML ~~LOC~~ SOLN
1400.0000 mg | SUBCUTANEOUS | Status: DC
  Administered 2017-03-11: 09:00:00 1400 mg via SUBCUTANEOUS
  Filled 2017-03-11 (×2): qty 11.7

## 2017-03-11 MED ORDER — ACETAMINOPHEN 325 MG PO TABS: 650.0000 mg | ORAL_TABLET | ORAL | Status: DC

## 2017-03-11 MED ORDER — DIPHENHYDRAMINE HCL 25 MG PO CAPS: 50.0000 mg | ORAL_CAPSULE | ORAL | Status: DC

## 2017-03-11 NOTE — Progress Notes (Signed)
Pt observed for 20 minutes post SQ Rituxan, tolerated well, and DC home without complaint.

## 2017-03-11 NOTE — Discharge Instructions (Signed)

## 2017-03-14 ENCOUNTER — Encounter: Payer: Self-pay | Admitting: Dietician

## 2017-03-14 ENCOUNTER — Encounter: Payer: Medicare Other | Attending: Family Medicine | Admitting: Dietician

## 2017-03-14 DIAGNOSIS — Z713 Dietary counseling and surveillance: Secondary | ICD-10-CM | POA: Insufficient documentation

## 2017-03-14 DIAGNOSIS — R7303 Prediabetes: Secondary | ICD-10-CM

## 2017-03-14 DIAGNOSIS — R739 Hyperglycemia, unspecified: Secondary | ICD-10-CM | POA: Diagnosis not present

## 2017-03-14 DIAGNOSIS — N186 End stage renal disease: Secondary | ICD-10-CM

## 2017-03-14 NOTE — Progress Notes (Signed)
Medical Nutrition Therapy:  Appt start time: 0945 end time:  1100.   Assessment:  Primary concerns today: Patient is here today alone.  She was referred for stage 5 CKD, problems with phosphorous metabolism, and hyperglycemia.  She was seen by another dietitian in the practice in 2017 for prediabetes.  She has been on hemodialysis since June 2018.  She has questions regarding her elevated phosphorous, phos binders and tolerance as well as other aspects of the renal diet.  Other history includes HTN, fibromyalgia, GERD, constipation, gout, rheumatoid arthritis, scleroderma, and IBS.  She has had a history of vitamin D and vitamin b-12 deficiency.  She has problems with anemia related to her CKD.  She also has Idiopathic thrombocytopenia and receives an IV treatment for this. Labs noted 12/30/16 Phosphorous 7.4, glucose 135, eGFR 6. She has lost from 190 lbs pre dialysis to 175 lbs today.   She still urinates despite kidney failure. She has worked with a Microbiologist at the dialysis center but wants more in depth consult today.  Patient lives with her husband and son.  Her son is in grad school to become a PharmD. She worked for [employer] in the past but lost her job during the [employer] and spent more time with her son.  Preferred Learning Style:   No preference indicated   Learning Readiness:   Ready  Change in progress   MEDICATIONS: see list to include iron, vitamin D via infusion, calcium acitate, steroids   DIETARY INTAKE:  Usual eating pattern includes 2-3 meals and 2-3 snacks per day. Avoided foods include added salt and high sodium, high phosphorous, high potassium foods, concentrated sweets. She reports having a difficulty getting adequate protein and nutrition at times secondary to not feeling well.    24-hr recall:  B ( AM): coffee with 1 tsp creamer, egg, portabella mushroom OR 1-2 boiled eggs, 2 strips bacon or [place] bacon OR frozen breakfast sandwich OR egg McMufin no  cheese with 1 strip bacon on English muffin, yogurt parfait, fruit cup or apple on HD days Snk ( AM): fruit  L ( PM): skips often or occasional healthy choice meal or leftovers Snk ( PM): fruit, PB and jelly sandwich on honey wheat bread D ( PM): grilled chicken or rotisserie chicken or steak or roast or salmon, vegetables, white rice or potatoes (not leached),  Snk ( PM): occasional PB & Jelly sandwich or leftovers Beverages: rare cola, regular gingerale (1 can per day) or sprite 7.5 ounces per day, coffee with 1 tsp creamer, cranberry or cherry juice, ice  (32 ounce fluid restriction per patient)  Usual physical activity: ADL's  (wants to do something to increase her endurance - weak for the past 3 weeks)  Estimated energy needs: 2000 calories 80-90 g protein  Progress Towards Goal(s):  In progress.   Nutritional Diagnosis:  NB-1.1 Food and nutrition-related knowledge deficit As related to renal diet.  As evidenced by patient report.    Intervention:  Nutrition education regarding a renal diet appropriate for stage 5 CKD as well as general recommendations for prediabetes.  Discussed adequate protein and nutritional intake, foods high in phosphorous to avoid, always take phos binder with meals and discuss problems with MD, continue low sodium, continue low potassium and fluid restriction.  Discussed meal plan and portion sizes.  Discussed renal resources.  Avoid foods on the phosphorous list. Remember to take your phos binder as directed. See handouts. Resources:  [employer].nl; [email or web address].org  Teaching Method Utilized:  Visual Auditory  Handouts given during visit include:  Stage 5 CKD nutrition tips  Stage 5 CKD nutrition therapy from AND  [facility] diety pyramid  Barriers to learning/adherence to lifestyle change: chronic illness  Demonstrated degree of understanding via:  Teach Back   Monitoring/Evaluation:  Dietary intake, exercise, label reading, and body weight  prn.

## 2017-03-14 NOTE — Patient Instructions (Signed)
Avoid foods on the phosphorous list. Remember to take your phos binder as directed.  See handouts.  Resources: [email or web address] [email or web address].org

## 2017-03-18 ENCOUNTER — Telehealth: Payer: Self-pay | Admitting: *Deleted

## 2017-03-18 ENCOUNTER — Encounter (HOSPITAL_COMMUNITY)
Admission: RE | Admit: 2017-03-18 | Discharge: 2017-03-18 | Disposition: A | Discharge status: Home / Self Care | Payer: Medicare Other | Source: Ambulatory Visit | Attending: Oncology | Admitting: Oncology

## 2017-03-18 DIAGNOSIS — M05719 Rheumatoid arthritis with rheumatoid factor of unspecified shoulder without organ or systems involvement: Secondary | ICD-10-CM | POA: Insufficient documentation

## 2017-03-18 DIAGNOSIS — D693 Immune thrombocytopenic purpura: Secondary | ICD-10-CM | POA: Insufficient documentation

## 2017-03-18 LAB — CBC
HCT: 33.4 % — ABNORMAL LOW (ref 36.0–46.0)
Hemoglobin: 10.5 g/dL — ABNORMAL LOW (ref 12.0–15.0)
MCH: 28.8 pg (ref 26.0–34.0)
MCHC: 31.4 g/dL (ref 30.0–36.0)
MCV: 91.5 fL (ref 78.0–100.0)
Platelets: 114 K/uL — ABNORMAL LOW (ref 150–400)
RBC: 3.65 MIL/uL — ABNORMAL LOW (ref 3.87–5.11)
RDW: 17.9 % — ABNORMAL HIGH (ref 11.5–15.5)
WBC: 7.1 K/uL (ref 4.0–10.5)

## 2017-03-18 MED ORDER — ACETAMINOPHEN 325 MG PO TABS: 650.0000 mg | ORAL_TABLET | ORAL | Status: DC

## 2017-03-18 MED ORDER — RITUXIMAB-HYALURONIDASE HUMAN 1400-23400 MG -UT/11.7ML ~~LOC~~ SOLN
1400.0000 mg | SUBCUTANEOUS | Status: DC
  Administered 2017-03-18: 09:00:00 1400 mg via SUBCUTANEOUS
  Filled 2017-03-18: qty 11.7

## 2017-03-18 MED ORDER — RITUXIMAB-HYALURONIDASE HUMAN 1400-23400 MG -UT/11.7ML ~~LOC~~ SOLN
1400.0000 mg | SUBCUTANEOUS | Status: DC
  Filled 2017-03-18: qty 11.7

## 2017-03-18 MED ORDER — DIPHENHYDRAMINE HCL 25 MG PO CAPS: 50.0000 mg | ORAL_CAPSULE | ORAL | Status: DC

## 2017-03-18 NOTE — Telephone Encounter (Signed)
-----   Message from Annia Belt, MD sent at 03/18/2017  1:26 PM EST ----- Call pt: platelets down to 114,000. Still in safe range but I do not want her to stop the prednisone yet. She should be on 20 mg daily

## 2017-03-18 NOTE — Telephone Encounter (Signed)
Called pt - no answer; left message "platelets down to 114,000. Still in safe range but I do not want her to stop the prednisone yet. She should be on 20 mg daily " per Dr Beryle Beams. And for any questions or if not on 20 mg of Prednisone

## 2017-03-21 NOTE — Telephone Encounter (Signed)
Dialysis did not tell me they stopped heparin but this is OK. They use an alternative anticoagulant, citrate, which will not affect the platelet count.

## 2017-03-21 NOTE — Telephone Encounter (Addendum)
Pt had questions about low platelet count - asked if she rec my message in Friday - stated no. Told her "platelets down to 114,000. Still in safe range but I do not want her to stop the prednisone yet. She should be on 20 mg daily ". Stated dialysis is not given her heparin; wants to know if Dr Darnell Level told them to stop heparin - last tx they used saline so she doesn't clot?

## 2017-03-22 NOTE — Telephone Encounter (Signed)
Called pt - no answer; I will call back later.

## 2017-03-23 NOTE — Telephone Encounter (Signed)
Her opinion is incorrect! It is the same drug. Takes weeks to show a response. Initial rise in platelets was from the steroids. She needs to stay on the current program. DrG

## 2017-03-23 NOTE — Telephone Encounter (Signed)
Pt informed of Dr Cathrine Muster response.  Also wants to know if she needs to go back to the Rituxan infusion rather than the injection. States a better response to the infusion in her opinion?

## 2017-03-24 ENCOUNTER — Other Ambulatory Visit: Payer: Self-pay | Admitting: Oncology

## 2017-03-24 ENCOUNTER — Other Ambulatory Visit (HOSPITAL_COMMUNITY): Payer: Self-pay | Admitting: *Deleted

## 2017-03-24 DIAGNOSIS — D693 Immune thrombocytopenic purpura: Secondary | ICD-10-CM

## 2017-03-24 NOTE — Telephone Encounter (Signed)
Pt called / informed "It is the same drug. Takes weeks to show a response. Initial rise in platelets was from the steroids. She needs to stay on the current program." per Dr Beryle Beams. Stated ok.

## 2017-03-25 ENCOUNTER — Ambulatory Visit (HOSPITAL_COMMUNITY)
Admission: RE | Admit: 2017-03-25 | Discharge: 2017-03-25 | Disposition: A | Discharge status: Home / Self Care | Payer: Medicare Other | Source: Ambulatory Visit | Attending: Oncology | Admitting: Oncology

## 2017-03-25 ENCOUNTER — Telehealth: Payer: Self-pay | Admitting: *Deleted

## 2017-03-25 ENCOUNTER — Other Ambulatory Visit: Payer: Self-pay | Admitting: Oncology

## 2017-03-25 DIAGNOSIS — D693 Immune thrombocytopenic purpura: Secondary | ICD-10-CM | POA: Diagnosis present

## 2017-03-25 DIAGNOSIS — M05719 Rheumatoid arthritis with rheumatoid factor of unspecified shoulder without organ or systems involvement: Secondary | ICD-10-CM

## 2017-03-25 LAB — CBC WITH DIFFERENTIAL/PLATELET
BASOS ABS: 0 10*3/uL (ref 0.0–0.1)
BASOS PCT: 0 %
EOS ABS: 0 10*3/uL (ref 0.0–0.7)
Eosinophils Relative: 0 %
HCT: 33.6 % — ABNORMAL LOW (ref 36.0–46.0)
Hemoglobin: 10.8 g/dL — ABNORMAL LOW (ref 12.0–15.0)
Lymphocytes Relative: 14 %
Lymphs Abs: 1 10*3/uL (ref 0.7–4.0)
MCH: 29.3 pg (ref 26.0–34.0)
MCHC: 32.1 g/dL (ref 30.0–36.0)
MCV: 91.1 fL (ref 78.0–100.0)
MONO ABS: 0.4 10*3/uL (ref 0.1–1.0)
Monocytes Relative: 6 %
Neutro Abs: 5.6 10*3/uL (ref 1.7–7.7)
Neutrophils Relative %: 80 %
PLATELETS: 127 10*3/uL — AB (ref 150–400)
RBC: 3.69 MIL/uL — AB (ref 3.87–5.11)
RDW: 17.4 % — ABNORMAL HIGH (ref 11.5–15.5)
WBC: 7 10*3/uL (ref 4.0–10.5)

## 2017-03-25 MED ORDER — RITUXIMAB-HYALURONIDASE HUMAN 1400-23400 MG -UT/11.7ML ~~LOC~~ SOLN
1400.0000 mg | Freq: Once | SUBCUTANEOUS | Status: DC
  Filled 2017-03-25 (×2): qty 11.7

## 2017-03-25 MED ORDER — ACETAMINOPHEN 325 MG PO TABS: 650.0000 mg | ORAL_TABLET | ORAL | Status: DC

## 2017-03-25 MED ORDER — DIPHENHYDRAMINE HCL 25 MG PO CAPS: 50.0000 mg | ORAL_CAPSULE | ORAL | Status: DC

## 2017-03-25 NOTE — Telephone Encounter (Signed)
Pt called / informed "platelets up to 127,000. Have her decrease prednisone to 1 tablet daily alternate with a half tablet daily. Come to our office week of 3/25 on day she does not have dialysis for a CBC." per Dr Beryle Beams. Voiced understanding. Lab appt scheduled Tues 3/26 @ 1145 AM. Pt stated she prefers to come after dialysis on Tues while she's out unless she should come on non dialysis day?

## 2017-03-25 NOTE — Telephone Encounter (Signed)
-----   Message from Annia Belt, MD sent at 03/25/2017  9:47 AM EDT ----- Call pt: platelets up to 127,000. Have her decrease prednisone to 1 tablet daily alternate with a half tablet daily. Come to our office week of 3/25 on day she does not have dialysis for a CBC; I do not trust results from dialysis center.

## 2017-04-01 ENCOUNTER — Telehealth: Payer: Self-pay | Admitting: *Deleted

## 2017-04-01 NOTE — Telephone Encounter (Signed)
Fax from dialysis - labs done 3/20 ; platelet count 136,000. Viewed by Dr Beryle Beams - called to pt.

## 2017-04-04 ENCOUNTER — Other Ambulatory Visit (INDEPENDENT_AMBULATORY_CARE_PROVIDER_SITE_OTHER): Payer: Medicare Other

## 2017-04-04 DIAGNOSIS — D693 Immune thrombocytopenic purpura: Secondary | ICD-10-CM | POA: Diagnosis not present

## 2017-04-04 LAB — CBC WITH DIFFERENTIAL/PLATELET
Basophils Absolute: 0 10*3/uL (ref 0.0–0.1)
Basophils Relative: 0 %
EOS ABS: 0 10*3/uL (ref 0.0–0.7)
EOS PCT: 0 %
HCT: 36.5 % (ref 36.0–46.0)
Hemoglobin: 11.5 g/dL — ABNORMAL LOW (ref 12.0–15.0)
LYMPHS ABS: 1.3 10*3/uL (ref 0.7–4.0)
Lymphocytes Relative: 19 %
MCH: 28.8 pg (ref 26.0–34.0)
MCHC: 31.5 g/dL (ref 30.0–36.0)
MCV: 91.5 fL (ref 78.0–100.0)
MONOS PCT: 12 %
Monocytes Absolute: 0.8 10*3/uL (ref 0.1–1.0)
Neutro Abs: 4.4 10*3/uL (ref 1.7–7.7)
Neutrophils Relative %: 69 %
PLATELETS: 245 10*3/uL (ref 150–400)
RBC: 3.99 MIL/uL (ref 3.87–5.11)
RDW: 18.1 % — ABNORMAL HIGH (ref 11.5–15.5)
WBC: 6.5 10*3/uL (ref 4.0–10.5)

## 2017-04-04 NOTE — Addendum Note (Signed)
Addended by: Truddie Crumble on: 04/04/2017 02:26 PM   Modules accepted: Orders

## 2017-04-05 ENCOUNTER — Telehealth: Payer: Self-pay | Admitting: *Deleted

## 2017-04-05 ENCOUNTER — Other Ambulatory Visit: Payer: Medicare Other

## 2017-04-05 NOTE — Telephone Encounter (Signed)
Pt called / informed "platelets 245,000; can decrease prednisone to 10 mg QD; repeat CBC here in 2 weeks" per DR Granfortuna. Voiced understanding. Lab appt scheduled Apr 8 @ 1000 AM.

## 2017-04-05 NOTE — Telephone Encounter (Signed)
-----   Message from Annia Belt, MD sent at 04/04/2017  4:23 PM EDT ----- Call platelets 245,000; can decrease prednisone to 10 mg QD; repeat CBC here in 2 weeks

## 2017-04-08 ENCOUNTER — Ambulatory Visit (INDEPENDENT_AMBULATORY_CARE_PROVIDER_SITE_OTHER): Payer: BLUE CROSS/BLUE SHIELD | Admitting: Orthopaedic Surgery

## 2017-04-14 ENCOUNTER — Telehealth: Payer: Self-pay | Admitting: *Deleted

## 2017-04-14 NOTE — Telephone Encounter (Signed)
Called pt - no answer; left message to give me a call back. 

## 2017-04-15 NOTE — Telephone Encounter (Signed)
Pt called / informed Dr Beryle Beams received results from dialysis; platelet count 182. And has a lab appt on Monday.

## 2017-04-15 NOTE — Telephone Encounter (Signed)
Noted thanks °

## 2017-04-18 ENCOUNTER — Telehealth: Payer: Self-pay | Admitting: *Deleted

## 2017-04-18 ENCOUNTER — Other Ambulatory Visit (INDEPENDENT_AMBULATORY_CARE_PROVIDER_SITE_OTHER): Payer: Medicare Other

## 2017-04-18 DIAGNOSIS — D693 Immune thrombocytopenic purpura: Secondary | ICD-10-CM

## 2017-04-18 LAB — CBC WITH DIFFERENTIAL/PLATELET
BASOS ABS: 0 10*3/uL (ref 0.0–0.1)
Basophils Relative: 0 %
Eosinophils Absolute: 0 10*3/uL (ref 0.0–0.7)
Eosinophils Relative: 0 %
HCT: 38.2 % (ref 36.0–46.0)
HEMOGLOBIN: 12.2 g/dL (ref 12.0–15.0)
LYMPHS ABS: 1 10*3/uL (ref 0.7–4.0)
LYMPHS PCT: 14 %
MCH: 29.6 pg (ref 26.0–34.0)
MCHC: 31.9 g/dL (ref 30.0–36.0)
MCV: 92.7 fL (ref 78.0–100.0)
Monocytes Absolute: 0.7 10*3/uL (ref 0.1–1.0)
Monocytes Relative: 10 %
NEUTROS ABS: 5.4 10*3/uL (ref 1.7–7.7)
NEUTROS PCT: 76 %
Platelets: 297 10*3/uL (ref 150–400)
RBC: 4.12 MIL/uL (ref 3.87–5.11)
RDW: 17.9 % — ABNORMAL HIGH (ref 11.5–15.5)
WBC: 7.1 10*3/uL (ref 4.0–10.5)

## 2017-04-18 NOTE — Telephone Encounter (Signed)
-----   Message from Annia Belt, MD sent at 04/18/2017  3:39 PM EDT ----- Call pt: platelets 297,000!

## 2017-04-18 NOTE — Telephone Encounter (Signed)
Pt called / informed "platelets 297,000! " per Dr Beryle Beams. Stated great and thanks for calling.

## 2017-05-30 ENCOUNTER — Ambulatory Visit: Payer: Medicare Other | Admitting: Oncology

## 2017-05-30 ENCOUNTER — Encounter: Payer: Self-pay | Admitting: Oncology

## 2017-05-30 ENCOUNTER — Other Ambulatory Visit: Payer: Self-pay

## 2017-05-30 VITALS — BP 168/78 | HR 70 | Temp 98.0°F | Ht 66.0 in | Wt 178.3 lb

## 2017-05-30 DIAGNOSIS — M349 Systemic sclerosis, unspecified: Secondary | ICD-10-CM

## 2017-05-30 DIAGNOSIS — I7300 Raynaud's syndrome without gangrene: Secondary | ICD-10-CM | POA: Diagnosis not present

## 2017-05-30 DIAGNOSIS — Z91048 Other nonmedicinal substance allergy status: Secondary | ICD-10-CM

## 2017-05-30 DIAGNOSIS — Z992 Dependence on renal dialysis: Secondary | ICD-10-CM

## 2017-05-30 DIAGNOSIS — D696 Thrombocytopenia, unspecified: Secondary | ICD-10-CM | POA: Diagnosis not present

## 2017-05-30 DIAGNOSIS — D693 Immune thrombocytopenic purpura: Secondary | ICD-10-CM

## 2017-05-30 DIAGNOSIS — N186 End stage renal disease: Secondary | ICD-10-CM

## 2017-05-30 DIAGNOSIS — Z7952 Long term (current) use of systemic steroids: Secondary | ICD-10-CM | POA: Diagnosis not present

## 2017-05-30 DIAGNOSIS — Z888 Allergy status to other drugs, medicaments and biological substances status: Secondary | ICD-10-CM | POA: Diagnosis not present

## 2017-05-30 DIAGNOSIS — M05719 Rheumatoid arthritis with rheumatoid factor of unspecified shoulder without organ or systems involvement: Secondary | ICD-10-CM

## 2017-05-30 DIAGNOSIS — Z79899 Other long term (current) drug therapy: Secondary | ICD-10-CM

## 2017-05-30 DIAGNOSIS — M351 Other overlap syndromes: Secondary | ICD-10-CM

## 2017-05-30 MED ORDER — PREDNISONE 5 MG PO TABS: ORAL_TABLET | ORAL | 0 refills | Status: DC

## 2017-05-30 NOTE — Patient Instructions (Signed)
To lab today Taper off prednisone as directed on new prescription : use 5 mg tabs to taper CBC every month at dialysis center fax to me Return visit 4 months

## 2017-05-30 NOTE — Progress Notes (Signed)
Hematology and Oncology Follow Up Visit  Katie Nunez [id number] September 08, 1955 62 y.o. 05/30/2017 5:31 PM   Principle Diagnosis: Encounter Diagnoses  Name Primary?  . Acute ITP ([place]) Yes  . Rheumatoid arthritis involving shoulder with positive rheumatoid factor, unspecified laterality ([place])   . ESRD (end stage renal disease) ([place])   . Scleroderma ([place])   . Thrombocytopenia [facility])   Clinical summary: 62 year old woman I have been following since June, 2018 when she developed unexplained sudden thrombocytopenia during an evaluation for end-stage renal disease now on dialysis. I initially thought she had hemolytic uremic syndrome.  She had spontaneous improvement in her blood counts and platelet counts only to have a subsequent significant fall limited to the platelets.  Ultimately diagnosis of ITP established by a very dramatic but short-lived response to steroids.    At time of her March 04, 2017 visit with me, she agreed to a trial  of Rituxan anti-CD20 antibody.. We reviewed all potential side effects including rare chance of anaphylactic reactions, hepatitis B reactivation, and even more rare, activation of JC virus.  She was hepatitis B negative as recently as February 24, 2017 and has been vaccinated against hepatitis B.  She is hepatitis C negative as well. After the initial dose given intravenously to assess tolerance, I gave her the new subcutaneous formulation for the remaining 3 weekly doses.  I kept her on steroids which were tapered down to current dose of 10 mg as of May 30, 2017. She had an excellent response to the Rituxan.  She has been getting blood counts checked weekly at her dialysis center.  Results forwarded to me.  Platelet count was 31,000 on February 20.  Now consistently above 200,000 with most recent value in our office on April 8 of 297,000 and recent value from the dialysis center of 298,000 on May 16. She tolerated the Rituxan well with no acute  toxicities.   Interim History: See discussion above.  She is doing well at this time.  No new side effects referable to her underlying collagen vascular disorder.  She remains on dialysis 3 times a week.  She has persistent ray nods phenomenon.  Despite cyanotic fingertips, she is having minimal pain.  No paresthesias.  No interim infections.  No cardiorespiratory complaints.  Still on 10 mg of prednisone daily.  Medications: reviewed  Allergies:  Allergies  Allergen Reactions  . Heparin Other (See Comments)    Platelets issues (suspected)  . Savella [Milnacipran Hcl] Palpitations and Other (See Comments)    Unknown  . Tape Rash and Other (See Comments)    Welts-- unsure if it was paper or adhesive tape    Review of Systems:  Remaining ROS negative:   Physical Exam: Blood pressure (!) 168/78, pulse 70, temperature 98 F (36.7 C), temperature source Oral, height 5\' 6"  (1.676 m), weight 178 lb 4.8 oz (80.9 kg), last menstrual period 11/15/2008. Wt Readings from Last 3 Encounters:  05/30/17 178 lb 4.8 oz (80.9 kg)  03/25/17 176 lb (79.8 kg)  03/18/17 175 lb (79.4 kg)     General appearance: Well-nourished African-American woman HENNT: Pharynx no erythema, exudate, mass, or ulcer. No thyromegaly or thyroid nodules Lymph nodes: No cervical, supraclavicular, or axillary lymphadenopathy Breasts:  Lungs: Clear to auscultation, resonant to percussion throughout Heart: Regular rhythm, no murmur, no gallop, no rub, no click, no edema Abdomen: Soft, nontender, normal bowel sounds, no mass, no organomegaly Extremities: Gross cyanotic changes soft tissues distal third, fourth, fifth fingers on  the right and third finger on the left.  No edema, no calf tenderness Musculoskeletal: no joint deformities GU:  Vascular: Carotid pulses 2+, no bruits, radial pulses 2+ symmetric, ulnar pulse 1+ on the left not palpable on the right.  Distal pulses: Dorsalis pedis 2+ symmetric, posterior tibial  pulses 1+ symmetric Neurologic: Alert, oriented, PERRLA, optic discs sharp and vessels normal, no hemorrhage or exudate, cranial nerves grossly normal, motor strength 5 over 5, reflexes 1+ symmetric, upper body coordination normal, gait normal, Skin: No rash or ecchymosis  Lab Results: CBC W/Diff    Component Value Date/Time   WBC 7.1 04/18/2017 1427   RBC 4.12 04/18/2017 1427   HGB 12.2 04/18/2017 1427   HGB 10.0 (L) 02/02/2017 1437   HCT 38.2 04/18/2017 1427   HCT 31.3 (L) 02/02/2017 1437   PLT 297 04/18/2017 1427   PLT 208 02/02/2017 1437   MCV 92.7 04/18/2017 1427   MCV 95 02/02/2017 1437   MCH 29.6 04/18/2017 1427   MCHC 31.9 04/18/2017 1427   RDW 17.9 (H) 04/18/2017 1427   RDW 19.2 (H) 02/02/2017 1437   LYMPHSABS 1.0 04/18/2017 1427   LYMPHSABS 0.9 02/02/2017 1437   MONOABS 0.7 04/18/2017 1427   EOSABS 0.0 04/18/2017 1427   EOSABS 0.0 02/02/2017 1437   BASOSABS 0.0 04/18/2017 1427   BASOSABS 0.0 02/02/2017 1437     Chemistry      Component Value Date/Time   NA 136 12/30/2016 0705   K 4.7 12/30/2016 0705   CL 101 12/30/2016 0705   CO2 22 12/30/2016 0705   BUN 80 (H) 12/30/2016 0705   CREATININE 8.20 (H) 12/30/2016 0705      Component Value Date/Time   CALCIUM 8.7 (L) 12/30/2016 0705   ALKPHOS 44 12/27/2016 1048   AST 25 12/27/2016 1048   ALT 18 12/27/2016 1048   BILITOT 0.7 12/27/2016 1048     Outside lab from 05/26/2017: Hemoglobin 12.2, hematocrit 36.6, MCV 91, white count 7800, platelet count 298,000  Radiological Studies: No results found.  Impression:  1.  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 Partial response to steroids.  Complete response to Rituxan.  About a 40-50% chance that this response will be durable. I will decrease her lab monitoring to monthly. I will do a slow taper of her prednisone.  I prescribed 5 mg tablets.  15 mg alternate with 10 mg x 8 days, 15 mg alternate with 5 mg x 8 days, 15 mg  alternate with 0 mg x 8 days, 10 mg every other day x8 days, 5 mg every other day x8 days, then stop.  2.  End-stage renal disease on dialysis Membranous glomerulopathy on needle biopsy of the kidney done June 29, 2016.  3.  Scleroderma/mixed connective tissue disorder High positive ANA, positive rheumatoid factor,  4.  Raynaud's phenomena related to #3.  CC: Patient Care Team: [name], MD as PCP - General Beryle Beams Alyson Locket, MD as Consulting Physician (Oncology) Early, Arvilla Meres, MD as Consulting Physician (Vascular Surgery) Center, [facility], Hassell Done, MD as Consulting Physician (Nephrology) Leavy Cella, MD as Referring Physician (Internal Medicine)   Murriel Hopper, MD, [place]  Hematology-Oncology/Internal Medicine     5/20/20195:31 PM

## 2017-05-31 ENCOUNTER — Telehealth: Payer: Self-pay | Admitting: *Deleted

## 2017-05-31 LAB — CBC WITH DIFFERENTIAL/PLATELET
BASOS ABS: 0 10*3/uL (ref 0.0–0.2)
Basos: 0 %
EOS (ABSOLUTE): 0 10*3/uL (ref 0.0–0.4)
Eos: 0 %
HEMATOCRIT: 36 % (ref 34.0–46.6)
Hemoglobin: 12.1 g/dL (ref 11.1–15.9)
Immature Grans (Abs): 0 10*3/uL (ref 0.0–0.1)
Immature Granulocytes: 0 %
LYMPHS ABS: 0.8 10*3/uL (ref 0.7–3.1)
Lymphs: 11 %
MCH: 30.2 pg (ref 26.6–33.0)
MCHC: 33.6 g/dL (ref 31.5–35.7)
MCV: 90 fL (ref 79–97)
MONOS ABS: 0.7 10*3/uL (ref 0.1–0.9)
Monocytes: 9 %
Neutrophils Absolute: 5.9 10*3/uL (ref 1.4–7.0)
Neutrophils: 80 %
Platelets: 247 10*3/uL (ref 150–450)
RBC: 4.01 x10E6/uL (ref 3.77–5.28)
RDW: 16.5 % — AB (ref 12.3–15.4)
WBC: 7.5 10*3/uL (ref 3.4–10.8)

## 2017-05-31 LAB — RETICULOCYTES: Retic Ct Pct: 0.9 % (ref 0.6–2.6)

## 2017-05-31 LAB — LACTATE DEHYDROGENASE: LDH: 196 IU/L (ref 119–226)

## 2017-05-31 NOTE — Telephone Encounter (Signed)
-----   Message from Annia Belt, MD sent at 05/31/2017  8:53 AM EDT ----- Call pt: platelets 247,000

## 2017-05-31 NOTE — Telephone Encounter (Signed)
Pt called / informed "platelets 247,000 " per Dr Beryle Beams. Stated thank-you for calling.

## 2017-06-02 ENCOUNTER — Telehealth: Payer: Self-pay | Admitting: Oncology

## 2017-06-02 ENCOUNTER — Telehealth: Payer: Self-pay | Admitting: Family Medicine

## 2017-06-02 NOTE — Telephone Encounter (Signed)
[facility] pharmacy states according to Prednisone rx as written, pt only needs 52 tabs not 126 tabs as prescribed. Also last 2 instructions are the same - "1 tab alternate w/0 tabs for 8 days and 1 tab every other day for 8 days". They wants to be this correct before filling. Thanks

## 2017-06-02 NOTE — Telephone Encounter (Signed)
Pharmacy called: they will amend Rx for right # of pills

## 2017-06-02 NOTE — Telephone Encounter (Signed)
The directions and qty of the prescription isn't matching, pls call the pharmacy

## 2017-06-24 ENCOUNTER — Encounter: Payer: Self-pay | Admitting: Oncology

## 2017-06-30 ENCOUNTER — Telehealth: Payer: Self-pay | Admitting: *Deleted

## 2017-06-30 NOTE — Telephone Encounter (Signed)
I called and lm on a voice identified vmail that I was sorry that I had not mentioned the pamphlet when I sent the email response to pt and that if pt does not have the pamphlet to please call and we will get her one I am sorry Dr Beryle Beams that I failed to mention this to pt when I sent her your response

## 2017-06-30 NOTE — Telephone Encounter (Signed)
Please let pt know her platelet count remains normal at 297,000

## 2017-06-30 NOTE — Telephone Encounter (Addendum)
Received faxed lab results drawn 06/23/2017 from [facility]. Given to Dr. Beryle Beams. Hubbard Hartshorn, RN, BSN

## 2017-07-05 NOTE — Telephone Encounter (Signed)
Lab result left on patient's self-identified VM. Requested return call if patient unable to locate ITP pamphlet. Hubbard Hartshorn, RN, BSN

## 2017-07-06 NOTE — Telephone Encounter (Signed)
Note thx DrG

## 2017-08-31 ENCOUNTER — Other Ambulatory Visit: Payer: Self-pay | Admitting: Family Medicine

## 2017-08-31 ENCOUNTER — Ambulatory Visit (INDEPENDENT_AMBULATORY_CARE_PROVIDER_SITE_OTHER): Payer: Medicare Other | Admitting: Internal Medicine

## 2017-08-31 DIAGNOSIS — R0600 Dyspnea, unspecified: Secondary | ICD-10-CM

## 2017-08-31 LAB — PULMONARY FUNCTION TEST
DL/VA % pred: 72 %
DL/VA: 3.54 ml/min/mmHg/L
DLCO unc % pred: 47 %
DLCO unc: 12.05 ml/min/mmHg
FEF 25-75 Post: 1.29 L/sec
FEF 25-75 Pre: 0.86 L/sec
FEF2575-%CHANGE-POST: 49 %
FEF2575-%PRED-PRE: 39 %
FEF2575-%Pred-Post: 59 %
FEV1-%Change-Post: 10 %
FEV1-%PRED-POST: 64 %
FEV1-%Pred-Pre: 58 %
FEV1-PRE: 1.28 L
FEV1-Post: 1.41 L
FEV1FVC-%CHANGE-POST: 6 %
FEV1FVC-%Pred-Pre: 92 %
FEV6-%Change-Post: 4 %
FEV6-%PRED-PRE: 64 %
FEV6-%Pred-Post: 67 %
FEV6-PRE: 1.75 L
FEV6-Post: 1.82 L
FEV6FVC-%Change-Post: 0 %
FEV6FVC-%Pred-Post: 102 %
FEV6FVC-%Pred-Pre: 102 %
FVC-%CHANGE-POST: 4 %
FVC-%PRED-POST: 65 %
FVC-%PRED-PRE: 62 %
FVC-POST: 1.83 L
FVC-PRE: 1.75 L
POST FEV6/FVC RATIO: 100 %
PRE FEV1/FVC RATIO: 73 %
Post FEV1/FVC ratio: 77 %
Pre FEV6/FVC Ratio: 100 %
RV % pred: 79 %
RV: 1.61 L
TLC % pred: 66 %
TLC: 3.44 L

## 2017-08-31 NOTE — Progress Notes (Signed)
PFT done today. 

## 2017-09-26 ENCOUNTER — Encounter: Payer: Self-pay | Admitting: Oncology

## 2017-09-26 ENCOUNTER — Ambulatory Visit: Payer: Medicare Other | Admitting: Oncology

## 2017-09-26 ENCOUNTER — Telehealth: Payer: Self-pay | Admitting: *Deleted

## 2017-09-26 ENCOUNTER — Other Ambulatory Visit: Payer: Self-pay

## 2017-09-26 VITALS — BP 150/89 | HR 80 | Temp 98.2°F | Ht 66.0 in | Wt 174.1 lb

## 2017-09-26 DIAGNOSIS — N186 End stage renal disease: Secondary | ICD-10-CM | POA: Diagnosis not present

## 2017-09-26 DIAGNOSIS — D696 Thrombocytopenia, unspecified: Secondary | ICD-10-CM

## 2017-09-26 DIAGNOSIS — R05 Cough: Secondary | ICD-10-CM

## 2017-09-26 DIAGNOSIS — M351 Other overlap syndromes: Secondary | ICD-10-CM

## 2017-09-26 DIAGNOSIS — D693 Immune thrombocytopenic purpura: Secondary | ICD-10-CM

## 2017-09-26 DIAGNOSIS — M349 Systemic sclerosis, unspecified: Secondary | ICD-10-CM

## 2017-09-26 DIAGNOSIS — Z91048 Other nonmedicinal substance allergy status: Secondary | ICD-10-CM

## 2017-09-26 DIAGNOSIS — Z79899 Other long term (current) drug therapy: Secondary | ICD-10-CM

## 2017-09-26 DIAGNOSIS — Z992 Dependence on renal dialysis: Secondary | ICD-10-CM

## 2017-09-26 DIAGNOSIS — Z888 Allergy status to other drugs, medicaments and biological substances status: Secondary | ICD-10-CM

## 2017-09-26 DIAGNOSIS — R5383 Other fatigue: Secondary | ICD-10-CM

## 2017-09-26 DIAGNOSIS — I7300 Raynaud's syndrome without gangrene: Secondary | ICD-10-CM

## 2017-09-26 LAB — CBC WITH DIFFERENTIAL/PLATELET
Abs Immature Granulocytes: 0 10*3/uL (ref 0.0–0.1)
BASOS PCT: 1 %
Basophils Absolute: 0 10*3/uL (ref 0.0–0.1)
EOS PCT: 1 %
Eosinophils Absolute: 0.1 10*3/uL (ref 0.0–0.7)
HEMATOCRIT: 37 % (ref 36.0–46.0)
Hemoglobin: 11.8 g/dL — ABNORMAL LOW (ref 12.0–15.0)
Immature Granulocytes: 1 %
Lymphocytes Relative: 21 %
Lymphs Abs: 0.8 10*3/uL (ref 0.7–4.0)
MCH: 29.6 pg (ref 26.0–34.0)
MCHC: 31.9 g/dL (ref 30.0–36.0)
MCV: 92.7 fL (ref 78.0–100.0)
Monocytes Absolute: 0.4 10*3/uL (ref 0.1–1.0)
Monocytes Relative: 9 %
Neutro Abs: 2.7 10*3/uL (ref 1.7–7.7)
Neutrophils Relative %: 67 %
PLATELETS: 171 10*3/uL (ref 150–400)
RBC: 3.99 MIL/uL (ref 3.87–5.11)
RDW: 16.4 % — AB (ref 11.5–15.5)
WBC: 4 10*3/uL (ref 4.0–10.5)

## 2017-09-26 LAB — RETICULOCYTES
RBC.: 3.99 MIL/uL (ref 3.87–5.11)
RETIC COUNT ABSOLUTE: 87.8 10*3/uL (ref 19.0–186.0)
Retic Ct Pct: 2.2 % (ref 0.4–3.1)

## 2017-09-26 NOTE — Telephone Encounter (Signed)
Pt called / informed "platelet count good @ 171,000" per Dr Beryle Beams. Stated good and thanks for calling.

## 2017-09-26 NOTE — Telephone Encounter (Signed)
-----   Message from Annia Belt, MD sent at 09/26/2017 11:50 AM EDT ----- Call pt: platelet count good @ 171,000

## 2017-09-26 NOTE — Progress Notes (Signed)
Hematology and Oncology Follow Up Visit  HENRIETTA CIESLEWICZ [id number] July 06, 1955 62 y.o. 09/26/2017 1:30 PM   Principle Diagnosis: Encounter Diagnoses  Name Primary?  . ESRD (end stage renal disease) ([place])   . Scleroderma ([place])   . Thrombocytopenia ([place])   . Chronic ITP (idiopathic thrombocytopenia) ([facility]) Yes  Clinical summary: 62 year old woman I have been following since June, 2018 when she developed unexplained sudden thrombocytopenia during an evaluation for end-stage renal disease now on dialysis. I initially thought she had hemolytic uremic syndrome.  She had spontaneous improvement in her blood counts and platelet counts only to have a subsequent significant fall limited to the platelets.  Ultimately diagnosis of ITP established by a very dramatic but short-lived response to steroids.   At time of her March 04, 2017 visit with me, she agreed to a trial  of Rituxan anti-CD20 antibody.. We reviewed all potential side effects including rare chance of anaphylactic reactions, hepatitis B reactivation, and even more rare, activation of JC virus. She was hepatitis B negative as recently as February 24, 2017 and has been vaccinated against hepatitis B. She is hepatitis C negative as well. After the initial dose given intravenously to assess tolerance, I gave her the new subcutaneous formulation for the remaining 3 weekly doses.  I kept her on steroids which were tapered down to 10 mg as of May 30, 2017. She had an excellent response to the Rituxan.  She has been getting blood counts checked weekly at her dialysis center.  Results forwarded to me.  Platelet count was 31,000 on February 20.  Now consistently above 200,000 with most recent value in our office on April 8 of 297,000 and recent value from the dialysis center of 339,000 on July 28, 2017. She tolerated the Rituxan well with no acute toxicities.  Interim History: She is now off all prednisone.  She tells me that a platelet count  done about a week ago at the dialysis center reported to her as decreased at 127,000.  I do not have confirmation of this.  Repeat CBC today in our office and platelet count is 171,000.  She denies any bleeding or bruising.  Main complaint is persistent fatigue and a chronic cough.  Cough is keeping her up at night which is impacting on her fatigue.  She saw ear nose and throat surgeon Dr.Teoh.  He did nasopharyngoscopy and did not find any pathology.  She had pulmonary function test in view of her history of sarcoid/scleroderma.  Compared with 2 years ago there has been a significant decrease in her DLCO from 75% of predicted to 48%.  Both FEV1 and FVC are decreased in the 50-50 range and although report states this is consistent with obstructive airway disease, I think that her clinical findings fit better with restrictive lung disease from pulmonary fibrosis.  Currently she is only having dyspnea with exertion but not at rest.  She would likely benefit from a pulmonary medicine evaluation.  High-resolution CT scan of the chest. Only medication change was from 1 beta-blocker to carvedilol. She is currently off all prednisone after she followed the tapering schedule recorded at time of her May 20 visit with me.  Medications: reviewed  Allergies:  Allergies  Allergen Reactions  . Heparin Other (See Comments)    Platelets issues (suspected)  . Savella [Milnacipran Hcl] Palpitations and Other (See Comments)    Unknown  . Tape Rash and Other (See Comments)    Welts-- unsure if it was paper or  adhesive tape    Review of Systems: See interim history Remaining ROS negative:   Physical Exam: Blood pressure (!) 150/89, pulse 80, temperature 98.2 F (36.8 C), temperature source Oral, height 5\' 6"  (1.676 m), weight 174 lb 1.6 oz (79 kg), last menstrual period 11/15/2008. Wt Readings from Last 3 Encounters:  09/26/17 174 lb 1.6 oz (79 kg)  05/30/17 178 lb 4.8 oz (80.9 kg)  03/25/17 176 lb (79.8 kg)      General appearance: Well-nourished African-American woman HENNT: Pharynx no erythema, exudate, mass, or ulcer. No thyromegaly or thyroid nodules Lymph nodes: No cervical, supraclavicular, or axillary lymphadenopathy Breasts: No abnormal skin changes, no dominant mass in either breast Lungs: Clear to auscultation, resonant to percussion throughout Heart: Regular rhythm, no murmur, no gallop, no rub, no click, no edema Abdomen: Soft, nontender, normal bowel sounds, no mass, no organomegaly Extremities: No edema, no calf tenderness Musculoskeletal: no joint deformities GU:  Vascular: Carotid pulses 2+, no bruits, AV fistula proximal left arm. Neurologic: Alert, oriented, PERRLA, , cranial nerves grossly normal, motor strength 5 over 5, reflexes 1+ symmetric, upper body coordination normal, gait normal, Skin: No rash or ecchymosis  Lab Results: CBC W/Diff    Component Value Date/Time   WBC 4.0 09/26/2017 1048   RBC 3.99 09/26/2017 1048   RBC 3.99 09/26/2017 1048   HGB 11.8 (L) 09/26/2017 1048   HGB 12.1 05/30/2017 1048   HCT 37.0 09/26/2017 1048   HCT 36.0 05/30/2017 1048   PLT 171 09/26/2017 1048   PLT 247 05/30/2017 1048   MCV 92.7 09/26/2017 1048   MCV 90 05/30/2017 1048   MCH 29.6 09/26/2017 1048   MCHC 31.9 09/26/2017 1048   RDW 16.4 (H) 09/26/2017 1048   RDW 16.5 (H) 05/30/2017 1048   LYMPHSABS 0.8 09/26/2017 1048   LYMPHSABS 0.8 05/30/2017 1048   MONOABS 0.4 09/26/2017 1048   EOSABS 0.1 09/26/2017 1048   EOSABS 0.0 05/30/2017 1048   BASOSABS 0.0 09/26/2017 1048   BASOSABS 0.0 05/30/2017 1048     Chemistry      Component Value Date/Time   NA 136 12/30/2016 0705   K 4.7 12/30/2016 0705   CL 101 12/30/2016 0705   CO2 22 12/30/2016 0705   BUN 80 (H) 12/30/2016 0705   CREATININE 8.20 (H) 12/30/2016 0705      Component Value Date/Time   CALCIUM 8.7 (L) 12/30/2016 0705   ALKPHOS 44 12/27/2016 1048   AST 25 12/27/2016 1048   ALT 18 12/27/2016 1048   BILITOT  0.7 12/27/2016 1048       Radiological Studies: No results found.  Impression:  1.  Chronic ITP Risk factors for development related to her underlying collagen vascular disorder.  Initial partial response to steroids.  Complete response after addition of Rituxan.  Currently off all steroids.  Continue to monitor blood counts every other month. She could receive Rituxan again if necessary.  2.  End-stage renal disease on dialysis with kidney biopsy consistent with membranous myelopathy done June 2018. She continues follow-up with nephrology.  3.  Scleroderma/mixed connective tissue disorder with high positive ANA and positive rheumatoid factor.  4.  Raynaud's phenomenon secondary to 3  5.  Chronic cough.  Deteriorating pulmonary function tests as outlined above.  Concern for progressive restrictive lung disease related to her mixed connective tissue disorder.  Consider pulmonary medicine consultation.  Consider high-resolution CT scan of the chest.  I will see her one more time in 4 months then transition her  hematology care to [facility].  CC: Patient Care Team: [name], MD as PCP - General Beryle Beams Alyson Locket, MD as Consulting Physician (Oncology) [place], Arvilla Meres, MD as Consulting Physician (Vascular Surgery) Center, [facility], Hassell Done, MD as Consulting Physician (Nephrology) Leavy Cella, MD as Referring Physician (Internal Medicine)   Murriel Hopper, MD, [place]  Hematology-Oncology/Internal Medicine     9/16/20191:30 PM

## 2017-09-26 NOTE — Patient Instructions (Addendum)
To lab today CBC here every 2 months MD visit in 4 months  Schedule new patient visit with Dr Burr Medico at [facility] center for March, 2020: ESRD on dialysis; scleroderma; steroid responsive ITP; post Rituxan

## 2017-09-27 LAB — COMPREHENSIVE METABOLIC PANEL
A/G RATIO: 1.5 (ref 1.2–2.2)
ALBUMIN: 3.8 g/dL (ref 3.5–5.5)
ALT: 22 IU/L (ref 0–32)
AST: 29 IU/L (ref 0–40)
Alkaline Phosphatase: 81 IU/L (ref 39–117)
BUN / CREAT RATIO: 7 — AB (ref 9–23)
BUN: 47 mg/dL — ABNORMAL HIGH (ref 6–24)
Bilirubin Total: 0.3 mg/dL (ref 0.0–1.2)
CALCIUM: 10 mg/dL (ref 8.7–10.2)
CO2: 25 mmol/L (ref 20–29)
Chloride: 93 mmol/L — ABNORMAL LOW (ref 96–106)
Creatinine, Ser: 6.83 mg/dL — ABNORMAL HIGH (ref 0.57–1.00)
GFR, EST AFRICAN AMERICAN: 7 mL/min/{1.73_m2} — AB (ref >59)
GFR, EST NON AFRICAN AMERICAN: 6 mL/min/{1.73_m2} — AB (ref >59)
GLOBULIN, TOTAL: 2.5 g/dL (ref 1.5–4.5)
Glucose: 84 mg/dL (ref 65–99)
POTASSIUM: 3.9 mmol/L (ref 3.5–5.2)
Sodium: 140 mmol/L (ref 134–144)
TOTAL PROTEIN: 6.3 g/dL (ref 6.0–8.5)

## 2017-09-27 LAB — LACTATE DEHYDROGENASE: LDH: 199 IU/L (ref 119–226)

## 2017-09-28 ENCOUNTER — Other Ambulatory Visit: Payer: Self-pay | Admitting: Internal Medicine

## 2017-09-28 DIAGNOSIS — J84178 Other interstitial pulmonary diseases with fibrosis in diseases classified elsewhere: Secondary | ICD-10-CM

## 2017-09-28 DIAGNOSIS — J8417 Other interstitial pulmonary diseases with fibrosis in diseases classified elsewhere: Principal | ICD-10-CM

## 2017-10-04 ENCOUNTER — Telehealth: Payer: Self-pay | Admitting: Family Medicine

## 2017-10-04 NOTE — Telephone Encounter (Signed)
Call from pt - stated when she turns her head a certain direction, she can feel a smaller than pea size area on right side below her ear; no soreness. Stated Dr Darnell Level usually check her lymph nodes so she wanted him to know.

## 2017-10-04 NOTE — Telephone Encounter (Signed)
Glenda  Pt is calling back, pls [phone] - Right side behind ear there a little lump and turn her neck one way you cant feel it but if you turn it another way you can feel it, it feels like the half size of a pea.

## 2017-10-04 NOTE — Telephone Encounter (Signed)
Called pt - no answer; left compliant message Dr Darnell Level stated "something this small is nothing to worry about" and to continue to watch if she notice any changes to call.

## 2017-10-04 NOTE — Telephone Encounter (Signed)
Called pt back - no answer; left message I had called.

## 2017-10-04 NOTE — Telephone Encounter (Signed)
Noted. Something this small is nothing to worry about

## 2017-10-05 ENCOUNTER — Ambulatory Visit
Admission: RE | Admit: 2017-10-05 | Discharge: 2017-10-05 | Disposition: A | Discharge status: Home / Self Care | Payer: Medicare Other | Source: Ambulatory Visit | Attending: Internal Medicine | Admitting: Internal Medicine

## 2017-10-05 DIAGNOSIS — J84178 Other interstitial pulmonary diseases with fibrosis in diseases classified elsewhere: Secondary | ICD-10-CM

## 2017-10-05 DIAGNOSIS — J8417 Other interstitial pulmonary diseases with fibrosis in diseases classified elsewhere: Principal | ICD-10-CM

## 2017-10-07 ENCOUNTER — Other Ambulatory Visit (HOSPITAL_COMMUNITY): Payer: Self-pay | Admitting: Internal Medicine

## 2017-10-07 ENCOUNTER — Telehealth: Payer: Self-pay | Admitting: Internal Medicine

## 2017-10-07 DIAGNOSIS — I272 Pulmonary hypertension, unspecified: Secondary | ICD-10-CM

## 2017-10-07 DIAGNOSIS — I2721 Secondary pulmonary arterial hypertension: Secondary | ICD-10-CM | POA: Insufficient documentation

## 2017-10-07 NOTE — Telephone Encounter (Signed)
Spoke with pt, she would like [place] to fax the PFT  report with CY recommendations to Dr, [name] [phone] and Dr. Leavy Cella [phone]. Faced report to both physicians. Nothing further is needed.

## 2017-10-13 ENCOUNTER — Institutional Professional Consult (permissible substitution): Payer: Medicare Other | Admitting: Pulmonary Disease

## 2017-10-14 ENCOUNTER — Institutional Professional Consult (permissible substitution): Payer: Medicare Other | Admitting: Pulmonary Disease

## 2017-10-19 ENCOUNTER — Ambulatory Visit (HOSPITAL_COMMUNITY)
Admission: RE | Admit: 2017-10-19 | Discharge: 2017-10-19 | Disposition: A | Discharge status: Home / Self Care | Payer: Medicare Other | Source: Ambulatory Visit | Attending: Family Medicine | Admitting: Family Medicine

## 2017-10-19 DIAGNOSIS — I071 Rheumatic tricuspid insufficiency: Secondary | ICD-10-CM | POA: Insufficient documentation

## 2017-10-19 DIAGNOSIS — I10 Essential (primary) hypertension: Secondary | ICD-10-CM | POA: Diagnosis not present

## 2017-10-19 DIAGNOSIS — I2721 Secondary pulmonary arterial hypertension: Secondary | ICD-10-CM | POA: Insufficient documentation

## 2017-10-19 DIAGNOSIS — I272 Pulmonary hypertension, unspecified: Secondary | ICD-10-CM

## 2017-10-19 NOTE — Progress Notes (Signed)
  Echocardiogram 2D Echocardiogram has been performed.  Jennette Dubin 10/19/2017, 10:03 AM

## 2017-10-26 ENCOUNTER — Encounter (INDEPENDENT_AMBULATORY_CARE_PROVIDER_SITE_OTHER): Payer: Self-pay | Admitting: Orthopaedic Surgery

## 2017-10-26 ENCOUNTER — Ambulatory Visit (INDEPENDENT_AMBULATORY_CARE_PROVIDER_SITE_OTHER): Payer: Medicare Other | Admitting: Orthopaedic Surgery

## 2017-10-26 ENCOUNTER — Ambulatory Visit (INDEPENDENT_AMBULATORY_CARE_PROVIDER_SITE_OTHER): Payer: Self-pay

## 2017-10-26 VITALS — BP 102/65 | HR 47 | Ht 66.0 in | Wt 172.0 lb

## 2017-10-26 DIAGNOSIS — M542 Cervicalgia: Secondary | ICD-10-CM | POA: Diagnosis not present

## 2017-10-26 DIAGNOSIS — M25511 Pain in right shoulder: Secondary | ICD-10-CM | POA: Diagnosis not present

## 2017-10-26 MED ORDER — METHYLPREDNISOLONE ACETATE 40 MG/ML IJ SUSP
40.0000 mg | INTRAMUSCULAR | Status: AC | PRN
  Administered 2017-10-26: 40 mg via INTRA_ARTICULAR

## 2017-10-26 MED ORDER — BUPIVACAINE HCL 0.25 % IJ SOLN
4.0000 mL | INTRAMUSCULAR | Status: AC | PRN
  Administered 2017-10-26: 4 mL via INTRA_ARTICULAR

## 2017-10-26 MED ORDER — LIDOCAINE HCL 1 % IJ SOLN
0.5000 mL | INTRAMUSCULAR | Status: AC | PRN
  Administered 2017-10-26: .5 mL

## 2017-10-26 NOTE — Progress Notes (Signed)
Office Visit Note   Patient: Katie Nunez           Date of Birth: 16-Feb-1955           MRN: [id number] Visit Date: 10/26/2017              Requested by: [name], [address], [place] [address] PCP: [name], MD   Assessment & Plan: Visit Diagnoses:  1. Neck pain   2. Right shoulder pain, unspecified chronicity     Plan: Right subacromial injection performed with improvement in her symptoms.  She will call if she has persistent symptoms.  Follow-Up Instructions: No follow-ups on file.   Orders:  Orders Placed This Encounter  Procedures  . XR Shoulder Right  . XR Cervical Spine 2 or 3 views   No orders of the defined types were placed in this encounter.     Procedures: Large Joint Inj: R subacromial bursa on 10/26/2017 2:55 PM Indications: pain Details: 22 G 1.5 in needle  Arthrogram: No  Medications: 4 mL bupivacaine 0.25 %; 40 mg methylPREDNISolone acetate 40 MG/ML; 0.5 mL lidocaine 1 % Outcome: tolerated well, no immediate complications Procedure, treatment alternatives, risks and benefits explained, specific risks discussed. Consent was given by the patient. Immediately prior to procedure a time out was called to verify the correct patient, procedure, equipment, support staff and site/side marked as required. Patient was prepped and draped in the usual sterile fashion.       Clinical Data: No additional findings.   Subjective: Chief Complaint  Patient presents with  . Right Shoulder - Pain  . Neck - Pain    HPI 62 year old female returns have not seen her since 2016 when she had a shoulder injection with good relief.  She has had increased pain in her shoulder pain with motion.  No numbness or tingling in the hand.  She is used ice, heat, Tylenol, over the counter salves without relief.  Previous right ankle surgery doing well since 2000.  She has scleroderma and recently had problems with renal  failure.  Review of Systems is systems positive for GERD fibromyalgia, renal failure, scleroderma, positive rheumatoid factor, allergic rhinitis, TMJ syndrome, chronic dialysis started September 2018, otherwise negative as it pertains to HPI.   Objective: Vital Signs: BP 102/65   Pulse (!) 47   Ht 5\' 6"  (1.676 m)   Wt 172 lb (78 kg)   LMP 11/15/2008   BMI 27.76 kg/m   Physical Exam  Constitutional: She is oriented to person, place, and time. She appears well-developed.  HENT:  Head: Normocephalic.  Right Ear: External ear normal.  Left Ear: External ear normal.  Eyes: Pupils are equal, round, and reactive to light.  Neck: No tracheal deviation present. No thyromegaly present.  Cardiovascular: Normal rate.  Pulmonary/Chest: Effort normal.  Abdominal: Soft.  Neurological: She is alert and oriented to person, place, and time.  Skin: Skin is warm and dry.  Psychiatric: She has a normal mood and affect. Her behavior is normal.    Ortho Exam extremity dialysis catheter left cephalic vein left upper arm.  Slight coolness of both fingertips with palpable pulses.  Positive impingement right shoulder.  Long head of the biceps tendon is moderately tender.  Minimal brachial plexus tenderness.  Healed scar from right IJ dialysis without tenderness.  No supervision lymphadenopathy.  No subluxation of the shoulder good cervical range of motion negative Spurling no lower extremity hyperreflexia.  Specialty  Comments:  No specialty comments available.  Imaging: No results found.   PMFS History: Patient Active Problem List   Diagnosis Date Noted  . Acute ITP ([place]) 01/05/2017  . ESRD (end stage renal disease) ([place]) 12/28/2016  . Hemolytic anemia ([place]) 07/26/2016  . Thrombocytopenia ([place]) 07/01/2016  . ARF (acute renal failure) ([place]) 06/25/2016  . Anxiety 06/25/2016  . Fibroid uterus 01/03/2012  . H/O vitamin D deficiency 01/03/2012  . Post-menopausal 01/03/2012  . Scleroderma ([place])  11/16/2010  . Rheumatoid arthritis ([place]) 11/16/2010  . Raynaud's disease 11/16/2010  . Hypertension 11/16/2010  . Symptomatic menopausal or female climacteric states 11/16/2010   Past Medical History:  Diagnosis Date  . Achalasia   . Anxiety   . Dysplasia of cervix, low grade (CIN 1)   . Environmental allergies    "all year long" (12/27/2016)  . ESRD (end stage renal disease) on dialysis [facility])    "TTS; [place] [facility]" (12/27/2016)  . Fibromyalgia   . GERD (gastroesophageal reflux disease)   . Gout   . Hypertension   . IBS (irritable bowel syndrome)   . MVP (mitral valve prolapse)   . RA (rheumatoid arthritis) ([facility])    FOLLOWED BY DR. SHANAHAN  . Raynaud's disease   . Scleroderma ([place])   . Seasonal allergies   . Thrombocytopenia ([place]) 07/01/2016   Acute fall to 13,000 07/01/16  . Tubular adenoma 01/08/2008   CECUM  . Vitamin D deficiency     Family History  Problem Relation Age of Onset  . Hypertension Mother   . Diabetes Mother   . Heart disease Father   . Hypertension Maternal Aunt   . Diabetes Maternal Grandmother   . Heart disease Paternal Grandfather   . Cerebral palsy Cousin        1ST COUSIN?  . Diabetes Paternal Grandmother     Past Surgical History:  Procedure Laterality Date  . ANKLE FRACTURE SURGERY Right   . AV FISTULA PLACEMENT Left 06/28/2016   Procedure: left arm ARTERIOVENOUS (AV) FISTULA CREATION;  Surgeon: Rosetta Posner, MD;  Location: [facility];  Service: Vascular;  Laterality: Left;  . BASCILIC VEIN TRANSPOSITION Left 09/27/2016   Procedure: LEFT UPPER ARM CEPHALIC VEIN TRANSPOSITION;  Surgeon: Rosetta Posner, MD;  Location: [facility];  Service: Vascular;  Laterality: Left;  . BREAST BIOPSY     "? side"  . CESAREAN SECTION  1994  . CO2 LASER OF CERVIX    . COLONOSCOPY W/ BIOPSIES  01/08/2008  . INSERTION OF DIALYSIS CATHETER Right 06/28/2016   Procedure: INSERTION OF DIALYSIS CATHETER, right internal jugular;  Surgeon: Rosetta Posner, MD;  Location: [facility];   Service: Vascular;  Laterality: Right;  . MYOMECTOMY    . PELVIC LAPAROSCOPY  2011  . superficial thrombophlebitis Left 07-2014   Social History   Occupational History  . Not on file  Tobacco Use  . Smoking status: Never Smoker  . Smokeless tobacco: Never Used  Substance and Sexual Activity  . Alcohol use: No  . Drug use: No  . Sexual activity: Not Currently    Birth control/protection: Post-menopausal

## 2017-11-02 ENCOUNTER — Ambulatory Visit (INDEPENDENT_AMBULATORY_CARE_PROVIDER_SITE_OTHER): Payer: Medicare Other | Admitting: Podiatry

## 2017-11-02 ENCOUNTER — Ambulatory Visit (INDEPENDENT_AMBULATORY_CARE_PROVIDER_SITE_OTHER): Payer: Medicare Other

## 2017-11-02 ENCOUNTER — Encounter: Payer: Self-pay | Admitting: Podiatry

## 2017-11-02 DIAGNOSIS — L84 Corns and callosities: Secondary | ICD-10-CM

## 2017-11-02 DIAGNOSIS — M778 Other enthesopathies, not elsewhere classified: Secondary | ICD-10-CM

## 2017-11-02 DIAGNOSIS — R600 Localized edema: Secondary | ICD-10-CM

## 2017-11-02 DIAGNOSIS — M1000 Idiopathic gout, unspecified site: Secondary | ICD-10-CM | POA: Diagnosis not present

## 2017-11-02 DIAGNOSIS — M779 Enthesopathy, unspecified: Secondary | ICD-10-CM

## 2017-11-02 MED ORDER — TRIAMCINOLONE ACETONIDE 10 MG/ML IJ SUSP
10.0000 mg | Freq: Once | INTRAMUSCULAR | Status: AC
  Administered 2017-11-02: 10 mg

## 2017-11-03 NOTE — Progress Notes (Signed)
Subjective:   Patient ID: Katie Nunez, female   DOB: 62 y.o.   MRN: [id number]   HPI Patient presents with pain on top of both feet with inflammation and swelling.  States is been more recent with patient and poor health and is on dialysis   ROS      Objective:  Physical Exam  Neurovascular status is intact with patient found to have inflammation of the dorsum of both feet with fluid buildup that appears to be more chronic in nature with negative Homans sign bilateral patient stating this is been present for a fairly long period of time     Assessment:  Poor health individual with chronic swelling and inflammation tendinitis dorsal feet bilateral with possibility also of gout-like symptomatology     Plan:  Reviewed all conditions and the relationship to the pain she is experiencing today I did inject the dorsal tendon complex bilateral 3 mg Kenalog 5 mg Xylocaine and then applied compression stockings bilateral to try to take pressure off her feet.  Patient will be seen back as needed I gave strict instructions of swelling were to get worse that she needs to go straight to her internal medicine physician but at this point elevation compression is her best option  X-ray indicates that there is swelling right over left foot with moderate arthritis but no indications of stress fracture or other acute pathology

## 2017-11-08 ENCOUNTER — Telehealth: Payer: Self-pay | Admitting: Podiatry

## 2017-11-08 ENCOUNTER — Other Ambulatory Visit: Payer: Self-pay | Admitting: Podiatry

## 2017-11-08 DIAGNOSIS — M779 Enthesopathy, unspecified: Secondary | ICD-10-CM

## 2017-11-08 DIAGNOSIS — M778 Other enthesopathies, not elsewhere classified: Secondary | ICD-10-CM

## 2017-11-08 DIAGNOSIS — R600 Localized edema: Secondary | ICD-10-CM

## 2017-11-08 DIAGNOSIS — M109 Gout, unspecified: Secondary | ICD-10-CM

## 2017-11-08 DIAGNOSIS — M1000 Idiopathic gout, unspecified site: Secondary | ICD-10-CM

## 2017-11-08 NOTE — Telephone Encounter (Signed)
Dr. Paulla Dolly order uric acid, sed rate. I informed pt of Dr. Mellody Drown orders.

## 2017-11-08 NOTE — Telephone Encounter (Signed)
Katie Nunez States they do not have orders for pt's labs. I told Katie Nunez I ordered them today and could see it in our system, I could fax to him. Katie L. States fax to [phone]. Faxed to [facility].

## 2017-11-08 NOTE — Telephone Encounter (Signed)
I was in last Wednesday and had two injections. I'm still having some swelling in my feet. I was wondering if you all could set up labs for me to have my uric acid checked? I can be reached at [phone].

## 2017-11-08 NOTE — Addendum Note (Signed)
Addended by: Harriett Sine D on: 11/08/2017 10:26 AM   Modules accepted: Orders

## 2017-11-09 ENCOUNTER — Institutional Professional Consult (permissible substitution): Payer: Medicare Other | Admitting: Emergency Medicine

## 2017-11-09 LAB — SEDIMENTATION RATE: Sed Rate: 22 mm/h (ref 0–30)

## 2017-11-09 LAB — URIC ACID: Uric Acid, Serum: 3.5 mg/dL (ref 2.5–7.0)

## 2017-11-10 NOTE — Telephone Encounter (Signed)
That should be fine. 

## 2017-11-10 NOTE — Progress Notes (Signed)
Katie Nunez, Katie Nunez has significant elevation of her uric acid and should be on medication to control.

## 2017-11-11 ENCOUNTER — Telehealth: Payer: Self-pay | Admitting: *Deleted

## 2017-11-11 ENCOUNTER — Institutional Professional Consult (permissible substitution): Payer: Medicare Other | Admitting: Emergency Medicine

## 2017-11-11 ENCOUNTER — Ambulatory Visit (INDEPENDENT_AMBULATORY_CARE_PROVIDER_SITE_OTHER): Payer: Medicare Other | Admitting: Emergency Medicine

## 2017-11-11 ENCOUNTER — Institutional Professional Consult (permissible substitution): Payer: Medicare Other | Admitting: Pulmonary Disease

## 2017-11-11 ENCOUNTER — Other Ambulatory Visit: Payer: Self-pay | Admitting: Emergency Medicine

## 2017-11-11 ENCOUNTER — Encounter: Payer: Self-pay | Admitting: Emergency Medicine

## 2017-11-11 DIAGNOSIS — R053 Chronic cough: Secondary | ICD-10-CM

## 2017-11-11 DIAGNOSIS — R0609 Other forms of dyspnea: Secondary | ICD-10-CM | POA: Diagnosis not present

## 2017-11-11 DIAGNOSIS — R05 Cough: Secondary | ICD-10-CM

## 2017-11-11 DIAGNOSIS — I272 Pulmonary hypertension, unspecified: Secondary | ICD-10-CM

## 2017-11-11 MED ORDER — SPACER/AERO-HOLDING CHAMBERS DEVI: 0 refills | Status: DC

## 2017-11-11 MED ORDER — PANTOPRAZOLE SODIUM 40 MG PO TBEC: 40.0000 mg | DELAYED_RELEASE_TABLET | Freq: Every day | ORAL | 1 refills | Status: DC

## 2017-11-11 NOTE — Telephone Encounter (Signed)
I spoke with pt and informed of Dr. Mellody Drown orders. Pt states she she has not been taking the allopurinol because she started dialysis, and requested the uric acid amount. I rechecked the labs and pt's uric acid was 3.5 just about center of normal and I showed Dr. Paulla Dolly. Dr. Paulla Dolly states pt does not need to be on any gout medications at this time.

## 2017-11-11 NOTE — Assessment & Plan Note (Signed)
Contributions of GERD, probably also breakthrough allergic rhinitis.  We will try to treat both more aggressively.  Please continue your Xyzal as you have been taking it. You may continue to have Benadryl available to use as needed for congestion. Try restarting your Singulair 10 mg each evening until next visit. Try restarting your fluticasone nasal spray (Flonase), 2 sprays each nostril once daily until next visit. Use your ipratropium (Atrovent) nasal spray, 2 sprays each nostril 2-3 times daily if needed for persistent nasal drainage. Please start pantoprazole 40 mg daily until next visit.  Take this medication 30 to 60 minutes before a meal.

## 2017-11-11 NOTE — Telephone Encounter (Signed)
Faxed copies of 11/09/2017 uric acid results to Dr. Tamala Julian and Dr. Lyda Kalata.

## 2017-11-11 NOTE — Patient Instructions (Signed)
Please continue your Xyzal as you have been taking it. You may continue to have Benadryl available to use as needed for congestion. Try restarting your Singulair 10 mg each evening until next visit. Try restarting your fluticasone nasal spray (Flonase), 2 sprays each nostril once daily until next visit. Use your ipratropium (Atrovent) nasal spray, 2 sprays each nostril 2-3 times daily if needed for persistent nasal drainage. Please start pantoprazole 40 mg daily until next visit.  Take this medication 30 to 60 minutes before a meal. Keep albuterol available to use 2 puffs if needed for shortness of breath.  We will teach you how to use this with a spacer.  You may want to try pretreating exertion see if this helps your breathing. Walking oximetry on room air today. Agree with referral to cardiology at [facility] to evaluate for possible pulmonary arterial hypertension associated with scleroderma.  The evaluation will be most effective if your systemic blood pressure is well controlled. Follow with Dr Lamonte Sakai in 1 month or next available to assess progress on these medications.

## 2017-11-11 NOTE — Progress Notes (Signed)
Subjective:    Patient ID: Katie Nunez, female    DOB: 11-14-1955, 62 y.o.   MRN: [id number]  HPI 62 year old never smoker with a history of scleroderma (Dr Boris Lown in [place]), characterized by some achalasia with GERD and dysphagia, Raynaud's phenomenon, rheumatoid arthritis, allergic rhinitis, hypertension, MVP.  She has end-stage renal disease is on hemodialysis.  She is been seen by Dr. Beryle Beams for ITP, treated with steroids and rituximab, completed and on surveillance.   She is referred today for evaluation of chronic cough, abnormal pulmonary function testing and exertional SOB.  Her PFT were done on 08/31/2017 and I have reviewed.  This shows mixed obstruction and restriction with a severely reduced FEV1, no bronchodilator response, restricted lung volumes and a decreased diffusion capacity that does not correct to the normal range when adjusted for alveolar volume. She coughs during the day, more at night. A little better since she started on . She is also on xyzal, benadryl. Has been on singulair, flonase but not reliably. She was just started on atrovent NS, some improvement in the cough. She started empiric albuterol recently, no real change.   High-resolution CT scan of the chest was done on 10/05/2017 which I reviewed and which shows no evidence of interstitial lung disease.  There is some medial right middle lobe and lingular scar noted.  TTE 10/19/17, reviewed. Intact LV fxn. Normal RV size and fxn but estimated PASP 60 mmHg. She is planing to be evaluated at [facility] for [facility]. Confounding issue has been that her systemic BP has ben difficult to control.     Review of Systems  Constitutional: Negative for fever and unexpected weight change.  HENT: Positive for congestion and sneezing. Negative for dental problem, ear pain, nosebleeds, postnasal drip, rhinorrhea, sinus pressure, sore throat and trouble swallowing.   Eyes: Negative for redness and itching.  Respiratory: Positive  for cough. Negative for chest tightness, shortness of breath and wheezing.   Cardiovascular: Negative for palpitations and leg swelling.  Gastrointestinal: Negative for nausea and vomiting.  Genitourinary: Negative for dysuria.  Musculoskeletal: Negative for joint swelling.  Skin: Negative for rash.  Neurological: Negative for headaches.  Hematological: Does not bruise/bleed easily.  Psychiatric/Behavioral: Negative for dysphoric mood. The patient is not nervous/anxious.    Past Medical History:  Diagnosis Date  . Achalasia   . Anxiety   . Dysplasia of cervix, low grade (CIN 1)   . Environmental allergies    "all year long" (12/27/2016)  . ESRD (end stage renal disease) on dialysis [facility])    "TTS; [place]" (12/27/2016)  . Fibromyalgia   . GERD (gastroesophageal reflux disease)   . Gout   . Hypertension   . IBS (irritable bowel syndrome)   . MVP (mitral valve prolapse)   . RA (rheumatoid arthritis) (HCC)    FOLLOWED BY DR. SHANAHAN  . Raynaud's disease   . Scleroderma ([place])   . Seasonal allergies   . Thrombocytopenia ([place]) 07/01/2016   Acute fall to 13,000 07/01/16  . Tubular adenoma 01/08/2008   CECUM  . Vitamin D deficiency      Family History  Problem Relation Age of Onset  . Hypertension Mother   . Diabetes Mother   . Heart disease Father   . Hypertension Maternal Aunt   . Diabetes Maternal Grandmother   . Heart disease Paternal Grandfather   . Cerebral palsy Cousin        1ST COUSIN?  . Diabetes Paternal Grandmother  Social History   Socioeconomic History  . Marital status: Married    Spouse name: Not on file  . Number of children: Not on file  . Years of education: Not on file  . Highest education level: Not on file  Occupational History  . Not on file  Social Needs  . Financial resource strain: Not on file  . Food insecurity:    Worry: Not on file    Inability: Not on file  . Transportation needs:    Medical: Not on file    Non-medical: Not  on file  Tobacco Use  . Smoking status: Never Smoker  . Smokeless tobacco: Never Used  Substance and Sexual Activity  . Alcohol use: No  . Drug use: No  . Sexual activity: Not Currently    Birth control/protection: Post-menopausal  Lifestyle  . Physical activity:    Days per week: Not on file    Minutes per session: Not on file  . Stress: Not on file  Relationships  . Social connections:    Talks on phone: Not on file    Gets together: Not on file    Attends religious service: Not on file    Active member of club or organization: Not on file    Attends meetings of clubs or organizations: Not on file    Relationship status: Not on file  . Intimate partner violence:    Fear of current or ex partner: Not on file    Emotionally abused: Not on file    Physically abused: Not on file    Forced sexual activity: Not on file  Other Topics Concern  . Not on file  Social History Narrative  . Not on file     Allergies  Allergen Reactions  . Savella [Milnacipran Hcl] Palpitations and Other (See Comments)    Unknown  . Tape Rash and Other (See Comments)    Welts-- unsure if it was paper or adhesive tape     Outpatient Medications Prior to Visit  Medication Sig Dispense Refill  . acetaminophen (TYLENOL) 500 MG tablet Take 500-1,000 mg by mouth every 6 (six) hours as needed (for pain/headaches.).     Marland Kitchen amLODipine (NORVASC) 10 MG tablet Take 10 mg by mouth daily with breakfast.  6  . azelastine (ASTELIN) 0.1 % nasal spray Place 1 spray into both nostrils daily.  2  . calcium acetate (PHOSLO) 667 MG capsule Take 667 mg by mouth 3 (three) times daily with meals.    . carvedilol (COREG) 3.125 MG tablet Take 3.125 mg by mouth 2 (two) times daily with a meal.    . cetirizine (ZYRTEC) 10 MG tablet Take 10 mg by mouth daily as needed (for allergies/alternates with singulair).     . Cinacalcet HCl (SENSIPAR PO) Take by mouth.    . clonazePAM (KLONOPIN) 0.5 MG tablet Take 0.5 mg by mouth 2  (two) times daily as needed for anxiety.     . cyclobenzaprine (FLEXERIL) 5 MG tablet Take 1 tablet (5 mg total) by mouth 3 (three) times daily as needed for muscle spasms. (Patient taking differently: Take 10 mg by mouth 3 (three) times daily as needed for muscle spasms. ) 30 tablet 0  . docusate sodium (COLACE) 100 MG capsule Take 2 capsules (200 mg total) by mouth 2 (two) times daily. (Patient taking differently: Take 200 mg by mouth 2 (two) times daily as needed (for constipation). ) 10 capsule 0  . Ferric Citrate (AURYXIA PO) Take by  mouth.    . fluticasone (FLONASE) 50 MCG/ACT nasal spray Place 1 spray into the nose daily.    Marland Kitchen levocetirizine (XYZAL) 5 MG tablet Take 5 mg by mouth daily.  2  . loperamide (IMODIUM) 2 MG capsule Take 2-4 mg by mouth 3 (three) times daily as needed for diarrhea or loose stools.    . montelukast (SINGULAIR) 10 MG tablet Take 10 mg by mouth daily.  1  . phenylephrine (NEO-SYNEPHRINE) 0.5 % nasal solution Place 1 drop into both nostrils every 6 (six) hours as needed for congestion. 15 mL 1  . allopurinol (ZYLOPRIM) 100 MG tablet Take 100 mg by mouth daily. as directed  3  . metoprolol succinate (TOPROL-XL) 50 MG 24 hr tablet Take 50 mg by mouth at bedtime.  0  . multivitamin (RENA-VIT) TABS tablet Take 1 tablet by mouth daily. 30 tablet 0  . predniSONE (DELTASONE) 5 MG tablet 3 tabs alternate with 2 tabs for 8 days 3 tabs alternate with 1 tab for 8 days 3 tabs alternate with 0 tabs for 8 days 2 tabs alternate with 0 tabs for 8 days 1 tab alternate with 0 tabs for 8 days 1 tab every other day for 8 days Then discontinue completely 126 tablet 0  . ranitidine (ZANTAC) 150 MG tablet Take 150 mg by mouth daily as needed for heartburn.     No facility-administered medications prior to visit.         Objective:   Physical Exam Vitals:   11/11/17 1250  BP: 128/82  Pulse: 89  SpO2: 92%  Weight: 167 lb 6.4 oz (75.9 kg)  Height: 5\' 6"  (1.676 m)   Gen:  Pleasant, well-nourished, in no distress,  normal affect  ENT: No lesions,  mouth clear,  oropharynx clear, no postnasal drip  Neck: No JVD, no stridor  Lungs: No use of accessory muscles, decreased at bases, no wheeze or crackles.   Cardiovascular: RRR, heart sounds normal, no murmur or gallops, no peripheral edema  Musculoskeletal: No deformities, no cyanosis or clubbing  Neuro: alert, non focal  Skin: Warm, no lesions or rash     Assessment & Plan:  Dyspnea on exertion Suspect multifactorial dyspnea, component of obstruction but also restriction likely due to obesity.  There is no evidence for diffuse interstitial lung disease on the CT chest, there is some focal medial right middle lobe and lingular scar.  She is been using albuterol but unclear whether she been using it correctly.  I like to continue it with a spacer and work on her technique so that we can discuss whether it has been beneficial to her breathing.  If so then we could consider starting long-acting bronchodilators.  Finally need to proceed with the evaluation for possible pulmonary hypertension in the setting of her scleroderma and autoimmune disease.  Unclear to me whether the estimated PASP of 60 mmHg (up from 35) is reflective of her systemic hypertension which has been difficult to treat versus true PAH.  Agree that she will ultimately need cardiology evaluation, this is planned at [facility] with a possible right heart cath.  Hopefully this can be done when her systemic blood pressure is under good control so that we can get useful data.  Chronic cough Contributions of GERD, probably also breakthrough allergic rhinitis.  We will try to treat both more aggressively.  Please continue your Xyzal as you have been taking it. You may continue to have Benadryl available to use as needed for congestion.  Try restarting your Singulair 10 mg each evening until next visit. Try restarting your fluticasone nasal spray (Flonase), 2  sprays each nostril once daily until next visit. Use your ipratropium (Atrovent) nasal spray, 2 sprays each nostril 2-3 times daily if needed for persistent nasal drainage. Please start pantoprazole 40 mg daily until next visit.  Take this medication 30 to 60 minutes before a meal.  Baltazar Apo, MD, PhD 11/11/2017, 5:29 PM [facility] [phone] or if no answer [phone]

## 2017-11-11 NOTE — Telephone Encounter (Deleted)
-----   Message from [name], DPM sent at 11/10/2017 10:23 AM EDT ----- Katie Nunez has significant elevation of her uric acid and should be on medication to control.

## 2017-11-11 NOTE — Telephone Encounter (Signed)
I informed pt of Dr. Mellody Drown statement after reviewing the current uric acid results. Pt states understanding and requested the labs be sent to Dr. Alben Spittle and Dr. Lyda Kalata at [facility].

## 2017-11-11 NOTE — Telephone Encounter (Signed)
-----   Message from [name], DPM sent at 11/10/2017 10:23 AM EDT ----- Katie Nunez has significant elevation of her uric acid and should be on medication to control.

## 2017-11-11 NOTE — Assessment & Plan Note (Signed)
Suspect multifactorial dyspnea, component of obstruction but also restriction likely due to obesity.  There is no evidence for diffuse interstitial lung disease on the CT chest, there is some focal medial right middle lobe and lingular scar.  She is been using albuterol but unclear whether she been using it correctly.  I like to continue it with a spacer and work on her technique so that we can discuss whether it has been beneficial to her breathing.  If so then we could consider starting long-acting bronchodilators.  Finally need to proceed with the evaluation for possible pulmonary hypertension in the setting of her scleroderma and autoimmune disease.  Unclear to me whether the estimated PASP of 60 mmHg (up from 35) is reflective of her systemic hypertension which has been difficult to treat versus true PAH.  Agree that she will ultimately need cardiology evaluation, this is planned at [facility] with a possible right heart cath.  Hopefully this can be done when her systemic blood pressure is under good control so that we can get useful data.

## 2017-11-28 ENCOUNTER — Telehealth: Payer: Self-pay | Admitting: *Deleted

## 2017-11-28 ENCOUNTER — Other Ambulatory Visit: Payer: Self-pay | Admitting: Oncology

## 2017-11-28 ENCOUNTER — Ambulatory Visit (INDEPENDENT_AMBULATORY_CARE_PROVIDER_SITE_OTHER): Payer: Medicare Other | Admitting: Emergency Medicine

## 2017-11-28 ENCOUNTER — Encounter: Payer: Self-pay | Admitting: Emergency Medicine

## 2017-11-28 ENCOUNTER — Other Ambulatory Visit (INDEPENDENT_AMBULATORY_CARE_PROVIDER_SITE_OTHER): Payer: Medicare Other

## 2017-11-28 VITALS — BP 132/82 | HR 60 | Ht 65.0 in | Wt 171.0 lb

## 2017-11-28 DIAGNOSIS — N186 End stage renal disease: Secondary | ICD-10-CM | POA: Diagnosis not present

## 2017-11-28 DIAGNOSIS — M349 Systemic sclerosis, unspecified: Secondary | ICD-10-CM | POA: Insufficient documentation

## 2017-11-28 DIAGNOSIS — I272 Pulmonary hypertension, unspecified: Secondary | ICD-10-CM | POA: Diagnosis not present

## 2017-11-28 DIAGNOSIS — D696 Thrombocytopenia, unspecified: Secondary | ICD-10-CM | POA: Diagnosis not present

## 2017-11-28 DIAGNOSIS — R05 Cough: Secondary | ICD-10-CM

## 2017-11-28 DIAGNOSIS — M359 Systemic involvement of connective tissue, unspecified: Secondary | ICD-10-CM | POA: Insufficient documentation

## 2017-11-28 DIAGNOSIS — I2721 Secondary pulmonary arterial hypertension: Secondary | ICD-10-CM | POA: Insufficient documentation

## 2017-11-28 DIAGNOSIS — D693 Immune thrombocytopenic purpura: Secondary | ICD-10-CM

## 2017-11-28 DIAGNOSIS — R053 Chronic cough: Secondary | ICD-10-CM

## 2017-11-28 DIAGNOSIS — J329 Chronic sinusitis, unspecified: Secondary | ICD-10-CM

## 2017-11-28 LAB — CBC WITH DIFFERENTIAL/PLATELET
ABS IMMATURE GRANULOCYTES: 0.01 10*3/uL (ref 0.00–0.07)
BASOS PCT: 1 %
Basophils Absolute: 0 10*3/uL (ref 0.0–0.1)
Eosinophils Absolute: 0 10*3/uL (ref 0.0–0.5)
Eosinophils Relative: 1 %
HEMATOCRIT: 39.6 % (ref 36.0–46.0)
Hemoglobin: 12.4 g/dL (ref 12.0–15.0)
IMMATURE GRANULOCYTES: 0 %
LYMPHS ABS: 0.6 10*3/uL — AB (ref 0.7–4.0)
LYMPHS PCT: 20 %
MCH: 28.8 pg (ref 26.0–34.0)
MCHC: 31.3 g/dL (ref 30.0–36.0)
MCV: 92.1 fL (ref 80.0–100.0)
MONOS PCT: 11 %
Monocytes Absolute: 0.3 10*3/uL (ref 0.1–1.0)
NEUTROS ABS: 2.1 10*3/uL (ref 1.7–7.7)
NEUTROS PCT: 67 %
PLATELETS: 127 10*3/uL — AB (ref 150–400)
RBC: 4.3 MIL/uL (ref 3.87–5.11)
RDW: 18 % — ABNORMAL HIGH (ref 11.5–15.5)
WBC: 3.1 10*3/uL — ABNORMAL LOW (ref 4.0–10.5)
nRBC: 0 % (ref 0.0–0.2)

## 2017-11-28 LAB — RETICULOCYTES
IMMATURE RETIC FRACT: 11.7 % (ref 2.3–15.9)
RBC.: 4.3 MIL/uL (ref 3.87–5.11)
Retic Count, Absolute: 48.6 10*3/uL (ref 19.0–186.0)
Retic Ct Pct: 1.1 % (ref 0.4–3.1)

## 2017-11-28 MED ORDER — PREDNISONE 10 MG PO TABS: 30.0000 mg | ORAL_TABLET | Freq: Every day | ORAL | 0 refills | Status: AC

## 2017-11-28 NOTE — Assessment & Plan Note (Signed)
Planning to initiate evaluation at [facility].  Need to ensure that her pulmonary pressures will be measured when she is at optimal volume status, has good systemic blood pressure control

## 2017-11-28 NOTE — Patient Instructions (Addendum)
Please take prednisone 30 mg daily  We will perform a CT scan of your sinuses Keep your albuterol available to use 2 puffs to be needed for shortness of breath, chest tightness, wheezing Please continue Xyzal, fluticasone nasal spray, Atrovent nasal spray, nasal saline washes as you are doing them. We may need to consider referring you again for allergy testing and immunotherapy (allergy shots) Continue pantoprazole 40 mg daily. Agree with evaluation with Dr. Gilles Chiquito at [facility] Follow with Dr Lamonte Sakai in 1 month or next available to discuss progress

## 2017-11-28 NOTE — Telephone Encounter (Signed)
Pt called / informed "platelets down to 127,000 but still in safe range. Repeat again next week." per Dr Beryle Beams. Lab appt scheduled next Monday 11/25 @ 1030 AM.

## 2017-11-28 NOTE — Telephone Encounter (Signed)
-----   Message from Annia Belt, MD sent at 11/28/2017  2:18 PM EST ----- Call pt: platelets down to 127,000 but still in safe range. Repeat again next week.

## 2017-11-28 NOTE — Addendum Note (Signed)
Addended by: Truddie Crumble on: 11/28/2017 11:01 AM   Modules accepted: Orders

## 2017-11-28 NOTE — Telephone Encounter (Signed)
Call pt - no answer; left message to call me back. 

## 2017-11-28 NOTE — Progress Notes (Signed)
Subjective:    Patient ID: Katie Nunez, female    DOB: July 03, 1955, 62 y.o.   MRN: [id number]  Cough  Pertinent negatives include no ear pain, eye redness, fever, headaches, postnasal drip, rash, rhinorrhea, sore throat, shortness of breath or wheezing.   62 year old never smoker with a history of scleroderma (Dr Boris Lown in [place]), characterized by some achalasia with GERD and dysphagia, Raynaud's phenomenon, rheumatoid arthritis, allergic rhinitis, hypertension, MVP.  She has end-stage renal disease is on hemodialysis.  She is been seen by Dr. Beryle Beams for ITP, treated with steroids and rituximab, completed and on surveillance.   She is referred today for evaluation of chronic cough, abnormal pulmonary function testing and exertional SOB.  Her PFT were done on 08/31/2017 and I have reviewed.  This shows mixed obstruction and restriction with a severely reduced FEV1, no bronchodilator response, restricted lung volumes and a decreased diffusion capacity that does not correct to the normal range when adjusted for alveolar volume. She coughs during the day, more at night. A little better since she started on . She is also on xyzal, benadryl. Has been on singulair, flonase but not reliably. She was just started on atrovent NS, some improvement in the cough. She started empiric albuterol recently, no real change.   High-resolution CT scan of the chest was done on 10/05/2017 which I reviewed and which shows no evidence of interstitial lung disease.  There is some medial right middle lobe and lingular scar noted.  TTE 10/19/17, reviewed. Intact LV fxn. Normal RV size and fxn but estimated PASP 60 mmHg. She is planing to be evaluated at [facility] for [facility]. Confounding issue has been that her systemic BP has been difficult to control.   ROV 11/28/17 --patient with a history of scleroderma, rheumatoid arthritis, Raynaud's, allergic rhinitis, GERD, ESRD on hemodialysis, ITP.  I saw her 6 weeks ago for  multifactorial dyspnea and chronic cough.  She has mixed obstruction and restriction on her pulmonary function testing without any evidence of interstitial disease by CT chest 10/05/2017.  We added Singulair and fluticasone nasal spray to her Benadryl and Xyzal, Atrovent nasal spray.  We also started pantoprazole 40 mg daily - she continues to have a little breakthrough GERD.  She is planning to see Dr Gilles Chiquito at [facility] this week re her [place]. She did not desaturate with ambulation last visit. She is still having a lot of PND, even on NSW's, flonase, xyzal, atrovent NS. She has seen Dr Donneta Romberg before, has not done immunotherapy.    Review of Systems  Constitutional: Negative for fever and unexpected weight change.  HENT: Positive for congestion and sneezing. Negative for dental problem, ear pain, nosebleeds, postnasal drip, rhinorrhea, sinus pressure, sore throat and trouble swallowing.   Eyes: Negative for redness and itching.  Respiratory: Positive for cough. Negative for chest tightness, shortness of breath and wheezing.   Cardiovascular: Negative for palpitations and leg swelling.  Gastrointestinal: Negative for nausea and vomiting.  Genitourinary: Negative for dysuria.  Musculoskeletal: Negative for joint swelling.  Skin: Negative for rash.  Neurological: Negative for headaches.  Hematological: Does not bruise/bleed easily.  Psychiatric/Behavioral: Negative for dysphoric mood. The patient is not nervous/anxious.    Past Medical History:  Diagnosis Date  . Achalasia   . Anxiety   . Dysplasia of cervix, low grade (CIN 1)   . Environmental allergies    "all year long" (12/27/2016)  . ESRD (end stage renal disease) on dialysis ([place])    "TTS; [facility]" (12/27/2016)  . Fibromyalgia   . GERD (gastroesophageal reflux disease)   . Gout   . Hypertension   . IBS (irritable bowel syndrome)   . MVP (mitral valve prolapse)   . RA (rheumatoid arthritis) (HCC)    FOLLOWED BY DR. SHANAHAN  .  Raynaud's disease   . Scleroderma ([place])   . Seasonal allergies   . Thrombocytopenia ([place]) 07/01/2016   Acute fall to 13,000 07/01/16  . Tubular adenoma 01/08/2008   CECUM  . Vitamin D deficiency      Family History  Problem Relation Age of Onset  . Hypertension Mother   . Diabetes Mother   . Heart disease Father   . Hypertension Maternal Aunt   . Diabetes Maternal Grandmother   . Heart disease Paternal Grandfather   . Cerebral palsy Cousin        1ST COUSIN?  . Diabetes Paternal Grandmother      Social History   Socioeconomic History  . Marital status: Married    Spouse name: Not on file  . Number of children: Not on file  . Years of education: Not on file  . Highest education level: Not on file  Occupational History  . Not on file  Social Needs  . Financial resource strain: Not on file  . Food insecurity:    Worry: Not on file    Inability: Not on file  . Transportation needs:    Medical: Not on file    Non-medical: Not on file  Tobacco Use  . Smoking status: Never Smoker  . Smokeless tobacco: Never Used  Substance and Sexual Activity  . Alcohol use: No  . Drug use: No  . Sexual activity: Not Currently    Birth control/protection: Post-menopausal  Lifestyle  . Physical activity:    Days per week: Not on file    Minutes per session: Not on file  . Stress: Not on file  Relationships  . Social connections:    Talks on phone: Not on file    Gets together: Not on file    Attends religious service: Not on file    Active member of club or organization: Not on file    Attends meetings of clubs or organizations: Not on file    Relationship status: Not on file  . Intimate partner violence:    Fear of current or ex partner: Not on file    Emotionally abused: Not on file    Physically abused: Not on file    Forced sexual activity: Not on file  Other Topics Concern  . Not on file  Social History Narrative  . Not on file     Allergies  Allergen Reactions  .  Savella [Milnacipran Hcl] Palpitations and Other (See Comments)    Unknown  . Tape Rash and Other (See Comments)    Welts-- unsure if it was paper or adhesive tape     Outpatient Medications Prior to Visit  Medication Sig Dispense Refill  . acetaminophen (TYLENOL) 500 MG tablet Take 500-1,000 mg by mouth every 6 (six) hours as needed (for pain/headaches.).     Marland Kitchen amLODipine (NORVASC) 10 MG tablet Take 10 mg by mouth daily with breakfast.  6  . azelastine (ASTELIN) 0.1 % nasal spray Place 1 spray into both nostrils daily.  2  . calcium acetate (PHOSLO) 667 MG capsule Take 667 mg by mouth 3 (three) times daily with meals.    . carvedilol (COREG) 3.125 MG tablet Take 3.125 mg  by mouth 2 (two) times daily with a meal.    . cetirizine (ZYRTEC) 10 MG tablet Take 10 mg by mouth daily as needed (for allergies/alternates with singulair).     . Cinacalcet HCl (SENSIPAR PO) Take by mouth.    . clonazePAM (KLONOPIN) 0.5 MG tablet Take 0.5 mg by mouth 2 (two) times daily as needed for anxiety.     . cyclobenzaprine (FLEXERIL) 5 MG tablet Take 1 tablet (5 mg total) by mouth 3 (three) times daily as needed for muscle spasms. (Patient taking differently: Take 10 mg by mouth 3 (three) times daily as needed for muscle spasms. ) 30 tablet 0  . docusate sodium (COLACE) 100 MG capsule Take 2 capsules (200 mg total) by mouth 2 (two) times daily. (Patient taking differently: Take 200 mg by mouth 2 (two) times daily as needed (for constipation). ) 10 capsule 0  . Ferric Citrate (AURYXIA PO) Take by mouth.    . fluticasone (FLONASE) 50 MCG/ACT nasal spray Place 1 spray into the nose daily.    Marland Kitchen levocetirizine (XYZAL) 5 MG tablet Take 5 mg by mouth daily.  2  . loperamide (IMODIUM) 2 MG capsule Take 2-4 mg by mouth 3 (three) times daily as needed for diarrhea or loose stools.    . montelukast (SINGULAIR) 10 MG tablet Take 10 mg by mouth daily.  1  . pantoprazole (PROTONIX) 40 MG tablet TAKE 1 TABLET(40 MG) BY MOUTH  DAILY 90 tablet 1  . phenylephrine (NEO-SYNEPHRINE) 0.5 % nasal solution Place 1 drop into both nostrils every 6 (six) hours as needed for congestion. 15 mL 1  . Spacer/Aero-Holding Chambers DEVI Use spacer with albuterol 1 each 0   No facility-administered medications prior to visit.         Objective:   Physical Exam Vitals:   11/28/17 1205  BP: 132/82  Pulse: 60  SpO2: 96%  Weight: 171 lb (77.6 kg)  Height: 5\' 5"  (1.651 m)   Gen: Pleasant, well-nourished, in no distress,  normal affect  ENT: No lesions,  mouth clear,  oropharynx clear, no postnasal drip  Neck: No JVD, no stridor  Lungs: No use of accessory muscles, decreased at bases, no wheeze or crackles.   Cardiovascular: RRR, heart sounds normal, no murmur or gallops, no peripheral edema  Musculoskeletal: No deformities, no cyanosis or clubbing  Neuro: alert, non focal  Skin: Warm, no lesions or rash     Assessment & Plan:  Pulmonary hypertension ([place]) Planning to initiate evaluation at [facility].  Need to ensure that her pulmonary pressures will be measured when she is at optimal volume status, has good systemic blood pressure control  Chronic cough Continues to be principally driven by chronic sinus drainage.  She is producing thick white mucus.  Continue her current GERD regimen, allergy regimen.  I will give her 5 days of prednisone to see if I can break the cough cycle but I predict that she will only get temporary relief until we can control the drainage better.  CT scan of her sinuses to evaluate for chronic sinusitis.  She has seen Dr. Benjamine Mola with ENT in the past.  She may need to go back to allergy, potentially do immunotherapy.  Baltazar Apo, MD, PhD 11/28/2017, 12:42 PM [facility] [phone] or if no answer [phone]

## 2017-11-28 NOTE — Assessment & Plan Note (Signed)
Continues to be principally driven by chronic sinus drainage.  She is producing thick white mucus.  Continue her current GERD regimen, allergy regimen.  I will give her 5 days of prednisone to see if I can break the cough cycle but I predict that she will only get temporary relief until we can control the drainage better.  CT scan of her sinuses to evaluate for chronic sinusitis.  She has seen Dr. Benjamine Mola with ENT in the past.  She may need to go back to allergy, potentially do immunotherapy.

## 2017-11-29 ENCOUNTER — Telehealth (HOSPITAL_COMMUNITY): Payer: Self-pay | Admitting: Vascular Surgery

## 2017-11-29 ENCOUNTER — Telehealth: Payer: Self-pay | Admitting: Emergency Medicine

## 2017-11-29 NOTE — Telephone Encounter (Signed)
Called and spoke with patient, she stated that she would need her whole record to take with her to [facility]. Advised patient that I could give her the number to medical records so that they could help her with this. Number given. Nothing further needed.

## 2017-11-29 NOTE — Telephone Encounter (Signed)
Spoke to pt to make NEW pulm htn appt, pt refused she states her doctor sent referral to wrong place

## 2017-12-02 NOTE — Addendum Note (Signed)
Addended by: Truddie Crumble on: 12/02/2017 10:55 AM   Modules accepted: Orders

## 2017-12-05 ENCOUNTER — Ambulatory Visit (INDEPENDENT_AMBULATORY_CARE_PROVIDER_SITE_OTHER)
Admission: RE | Admit: 2017-12-05 | Discharge: 2017-12-05 | Disposition: A | Discharge status: Home / Self Care | Payer: Medicare Other | Source: Ambulatory Visit | Attending: Emergency Medicine | Admitting: Emergency Medicine

## 2017-12-05 ENCOUNTER — Other Ambulatory Visit (INDEPENDENT_AMBULATORY_CARE_PROVIDER_SITE_OTHER): Payer: Medicare Other

## 2017-12-05 DIAGNOSIS — D696 Thrombocytopenia, unspecified: Secondary | ICD-10-CM | POA: Diagnosis not present

## 2017-12-05 DIAGNOSIS — J329 Chronic sinusitis, unspecified: Secondary | ICD-10-CM

## 2017-12-05 LAB — CBC WITH DIFFERENTIAL/PLATELET
ABS IMMATURE GRANULOCYTES: 0.02 10*3/uL (ref 0.00–0.07)
BASOS PCT: 1 %
Basophils Absolute: 0 10*3/uL (ref 0.0–0.1)
EOS ABS: 0 10*3/uL (ref 0.0–0.5)
Eosinophils Relative: 1 %
HCT: 39.4 % (ref 36.0–46.0)
Hemoglobin: 12.5 g/dL (ref 12.0–15.0)
IMMATURE GRANULOCYTES: 1 %
Lymphocytes Relative: 22 %
Lymphs Abs: 0.7 10*3/uL (ref 0.7–4.0)
MCH: 28.9 pg (ref 26.0–34.0)
MCHC: 31.7 g/dL (ref 30.0–36.0)
MCV: 91 fL (ref 80.0–100.0)
MONOS PCT: 10 %
Monocytes Absolute: 0.3 10*3/uL (ref 0.1–1.0)
NEUTROS ABS: 2.1 10*3/uL (ref 1.7–7.7)
NEUTROS PCT: 65 %
PLATELETS: 125 10*3/uL — AB (ref 150–400)
RBC: 4.33 MIL/uL (ref 3.87–5.11)
RDW: 18.3 % — AB (ref 11.5–15.5)
WBC: 3.2 10*3/uL — AB (ref 4.0–10.5)
nRBC: 0 % (ref 0.0–0.2)

## 2017-12-06 ENCOUNTER — Telehealth: Payer: Self-pay | Admitting: *Deleted

## 2017-12-06 ENCOUNTER — Other Ambulatory Visit: Payer: Self-pay | Admitting: Oncology

## 2017-12-06 DIAGNOSIS — N186 End stage renal disease: Secondary | ICD-10-CM

## 2017-12-06 DIAGNOSIS — D693 Immune thrombocytopenic purpura: Secondary | ICD-10-CM

## 2017-12-06 DIAGNOSIS — M349 Systemic sclerosis, unspecified: Secondary | ICD-10-CM

## 2017-12-06 NOTE — Telephone Encounter (Signed)
-----   Message from Katie Belt, MD sent at 12/05/2017  1:32 PM EST ----- Call pt: platelets about the same as last week at 125,000. We can wait 2-3 weeks before we check again.

## 2017-12-06 NOTE — Telephone Encounter (Signed)
Pt called / informed "platelets about the same as last week at 125,000. We can wait 2-3 weeks before we check again" per Dr Beryle Beams. Next lab appt scheduled Monday 12/16 @ 1130 AM.

## 2017-12-07 ENCOUNTER — Inpatient Hospital Stay: Admission: RE | Admit: 2017-12-07 | Payer: Medicare Other | Source: Ambulatory Visit

## 2017-12-19 DIAGNOSIS — K090 Developmental odontogenic cysts: Secondary | ICD-10-CM | POA: Insufficient documentation

## 2017-12-22 ENCOUNTER — Other Ambulatory Visit: Payer: Medicare Other

## 2017-12-26 ENCOUNTER — Telehealth: Payer: Self-pay | Admitting: *Deleted

## 2017-12-26 ENCOUNTER — Other Ambulatory Visit (INDEPENDENT_AMBULATORY_CARE_PROVIDER_SITE_OTHER): Payer: Medicare Other

## 2017-12-26 DIAGNOSIS — M349 Systemic sclerosis, unspecified: Secondary | ICD-10-CM | POA: Diagnosis not present

## 2017-12-26 DIAGNOSIS — N186 End stage renal disease: Secondary | ICD-10-CM | POA: Diagnosis not present

## 2017-12-26 DIAGNOSIS — D696 Thrombocytopenia, unspecified: Secondary | ICD-10-CM

## 2017-12-26 DIAGNOSIS — D693 Immune thrombocytopenic purpura: Secondary | ICD-10-CM | POA: Diagnosis not present

## 2017-12-26 LAB — CBC WITH DIFFERENTIAL/PLATELET
Abs Immature Granulocytes: 0.01 10*3/uL (ref 0.00–0.07)
BASOS ABS: 0 10*3/uL (ref 0.0–0.1)
Basophils Relative: 1 %
Eosinophils Absolute: 0.1 10*3/uL (ref 0.0–0.5)
Eosinophils Relative: 2 %
HEMATOCRIT: 37.2 % (ref 36.0–46.0)
HEMOGLOBIN: 11.6 g/dL — AB (ref 12.0–15.0)
IMMATURE GRANULOCYTES: 0 %
LYMPHS ABS: 0.6 10*3/uL — AB (ref 0.7–4.0)
LYMPHS PCT: 17 %
MCH: 29.1 pg (ref 26.0–34.0)
MCHC: 31.2 g/dL (ref 30.0–36.0)
MCV: 93.5 fL (ref 80.0–100.0)
Monocytes Absolute: 0.5 10*3/uL (ref 0.1–1.0)
Monocytes Relative: 13 %
NEUTROS PCT: 67 %
NRBC: 0 % (ref 0.0–0.2)
Neutro Abs: 2.5 10*3/uL (ref 1.7–7.7)
Platelets: 135 10*3/uL — ABNORMAL LOW (ref 150–400)
RBC: 3.98 MIL/uL (ref 3.87–5.11)
RDW: 18.7 % — ABNORMAL HIGH (ref 11.5–15.5)
WBC: 3.7 10*3/uL — ABNORMAL LOW (ref 4.0–10.5)

## 2017-12-26 LAB — RETICULOCYTES
Immature Retic Fract: 17.9 % — ABNORMAL HIGH (ref 2.3–15.9)
RBC.: 3.98 MIL/uL (ref 3.87–5.11)
RETIC COUNT ABSOLUTE: 62.5 10*3/uL (ref 19.0–186.0)
Retic Ct Pct: 1.6 % (ref 0.4–3.1)

## 2017-12-26 NOTE — Telephone Encounter (Signed)
-----   Message from Annia Belt, MD sent at 12/26/2017  1:44 PM EST ----- Call pt: platelets up from 125,000 to 135,000 - can check again in one month

## 2017-12-26 NOTE — Telephone Encounter (Signed)
That is fine 

## 2017-12-26 NOTE — Telephone Encounter (Signed)
Pt called / informed "platelets up from 125,000 to 135,000 - can check again in one month " per Dr Beryle Beams. She has an appt 01/31/18; wanting to wait until then to have labs done.

## 2017-12-27 DIAGNOSIS — L298 Other pruritus: Secondary | ICD-10-CM | POA: Insufficient documentation

## 2017-12-30 ENCOUNTER — Encounter: Payer: Self-pay | Admitting: Emergency Medicine

## 2017-12-30 ENCOUNTER — Ambulatory Visit (INDEPENDENT_AMBULATORY_CARE_PROVIDER_SITE_OTHER): Payer: Medicare Other | Admitting: Emergency Medicine

## 2017-12-30 DIAGNOSIS — I272 Pulmonary hypertension, unspecified: Secondary | ICD-10-CM

## 2017-12-30 DIAGNOSIS — R053 Chronic cough: Secondary | ICD-10-CM

## 2017-12-30 DIAGNOSIS — R05 Cough: Secondary | ICD-10-CM | POA: Diagnosis not present

## 2017-12-30 DIAGNOSIS — J449 Chronic obstructive pulmonary disease, unspecified: Secondary | ICD-10-CM

## 2017-12-30 NOTE — Progress Notes (Signed)
Subjective:    Patient ID: Katie Nunez, female    DOB: 07-Jul-1955, 62 y.o.   MRN: [id number]  Cough  Pertinent negatives include no ear pain, eye redness, fever, headaches, postnasal drip, rash, rhinorrhea, sore throat, shortness of breath or wheezing.   62 year old never smoker with a history of scleroderma (Dr Boris Lown in [place]), characterized by some achalasia with GERD and dysphagia, Raynaud's phenomenon, rheumatoid arthritis, allergic rhinitis, hypertension, MVP.  She has end-stage renal disease is on hemodialysis.  She is been seen by Dr. Beryle Beams for ITP, treated with steroids and rituximab, completed and on surveillance.   She is referred today for evaluation of chronic cough, abnormal pulmonary function testing and exertional SOB.  Her PFT were done on 08/31/2017 and I have reviewed.  This shows mixed obstruction and restriction with a severely reduced FEV1, no bronchodilator response, restricted lung volumes and a decreased diffusion capacity that does not correct to the normal range when adjusted for alveolar volume. She coughs during the day, more at night. A little better since she started on . She is also on xyzal, benadryl. Has been on singulair, flonase but not reliably. She was just started on atrovent NS, some improvement in the cough. She started empiric albuterol recently, no real change.   High-resolution CT scan of the chest was done on 10/05/2017 which I reviewed and which shows no evidence of interstitial lung disease.  There is some medial right middle lobe and lingular scar noted.  TTE 10/19/17, reviewed. Intact LV fxn. Normal RV size and fxn but estimated PASP 60 mmHg. She is planing to be [facility] at [facility]. Confounding issue has been that her systemic BP has been difficult to control.   ROV 11/28/17 --patient with a history of scleroderma, rheumatoid arthritis, Raynaud's, allergic rhinitis, GERD, ESRD on hemodialysis, ITP.  I saw her 6 weeks ago for  multifactorial dyspnea and chronic cough.  She has mixed obstruction and restriction on her pulmonary function testing without any evidence of interstitial disease by CT chest 10/05/2017.  We added Singulair and fluticasone nasal spray to her Benadryl and Xyzal, Atrovent nasal spray.  We also started pantoprazole 40 mg daily - she continues to have a little breakthrough GERD.  She is planning to see Dr Gilles Chiquito at [facility] this week re her [place]. She did not desaturate with ambulation last visit. She is still having a lot of PND, even on NSW's, flonase, xyzal, atrovent NS. She has seen Dr Donneta Romberg before, has not done immunotherapy.   ROV 12/30/17 --62 year old woman with scleroderma, RA, ray nodes, end-stage renal disease on hemodialysis and history of ITP.  PAH that is under evaluation at [facility], has had R heart cath since last time. They are planning to start targeted PAH meds, adcirca and tadalafil. She was started on O2 after dersat on 6 min walk. PFT with mixed restriction and obstruction - unclear that she has benefited from albuterol in the past.   She has multifactorial dyspnea and chronic cough in the setting of allergic rhinitis and GERD.  We treated her GERD with pantoprazole, still with a little bit of breakthrough.  She has significant chronic rhinitis even on Xyzal, fluticasone nasal spray, Atrovent nasal spray and NSW.  We performed a CT scan of her sinuses on 12/05/2017 that I reviewed, shows some mild thickening in the ethmoid air cells and sphenoid sinuses but without any evidence for acute or chronic sinusitis. She continues to have nasal congestion and drainage.  Review of Systems  Constitutional: Negative for fever and unexpected weight change.  HENT: Positive for congestion and sneezing. Negative for dental problem, ear pain, nosebleeds, postnasal drip, rhinorrhea, sinus pressure, sore throat and trouble swallowing.   Eyes: Negative for redness and itching.  Respiratory: Positive for cough.  Negative for chest tightness, shortness of breath and wheezing.   Cardiovascular: Negative for palpitations and leg swelling.  Gastrointestinal: Negative for nausea and vomiting.  Genitourinary: Negative for dysuria.  Musculoskeletal: Negative for joint swelling.  Skin: Negative for rash.  Neurological: Negative for headaches.  Hematological: Does not bruise/bleed easily.  Psychiatric/Behavioral: Negative for dysphoric mood. The patient is not nervous/anxious.    Past Medical History:  Diagnosis Date  . Achalasia   . Anxiety   . Dysplasia of cervix, low grade (CIN 1)   . Environmental allergies    "all year long" (12/27/2016)  . ESRD (end stage renal disease) on dialysis [facility])    "TTS; [place]" (12/27/2016)  . Fibromyalgia   . GERD (gastroesophageal reflux disease)   . Gout   . Hypertension   . IBS (irritable bowel syndrome)   . MVP (mitral valve prolapse)   . RA (rheumatoid arthritis) (HCC)    FOLLOWED BY DR. SHANAHAN  . Raynaud's disease   . Scleroderma ([place])   . Seasonal allergies   . Thrombocytopenia ([place]) 07/01/2016   Acute fall to 13,000 07/01/16  . Tubular adenoma 01/08/2008   CECUM  . Vitamin D deficiency      Family History  Problem Relation Age of Onset  . Hypertension Mother   . Diabetes Mother   . Heart disease Father   . Hypertension Maternal Aunt   . Diabetes Maternal Grandmother   . Heart disease Paternal Grandfather   . Cerebral palsy Cousin        1ST COUSIN?  . Diabetes Paternal Grandmother      Social History   Socioeconomic History  . Marital status: Married    Spouse name: Not on file  . Number of children: Not on file  . Years of education: Not on file  . Highest education level: Not on file  Occupational History  . Not on file  Social Needs  . Financial resource strain: Not on file  . Food insecurity:    Worry: Not on file    Inability: Not on file  . Transportation needs:    Medical: Not on file    Non-medical: Not on file    Tobacco Use  . Smoking status: Never Smoker  . Smokeless tobacco: Never Used  Substance and Sexual Activity  . Alcohol use: No  . Drug use: No  . Sexual activity: Not Currently    Birth control/protection: Post-menopausal  Lifestyle  . Physical activity:    Days per week: Not on file    Minutes per session: Not on file  . Stress: Not on file  Relationships  . Social connections:    Talks on phone: Not on file    Gets together: Not on file    Attends religious service: Not on file    Active member of club or organization: Not on file    Attends meetings of clubs or organizations: Not on file    Relationship status: Not on file  . Intimate partner violence:    Fear of current or ex partner: Not on file    Emotionally abused: Not on file    Physically abused: Not on file    Forced sexual  activity: Not on file  Other Topics Concern  . Not on file  Social History Narrative  . Not on file     Allergies  Allergen Reactions  . Savella [Milnacipran Hcl] Palpitations and Other (See Comments)    Unknown  . Tape Rash and Other (See Comments)    Welts-- unsure if it was paper or adhesive tape     Outpatient Medications Prior to Visit  Medication Sig Dispense Refill  . acetaminophen (TYLENOL) 500 MG tablet Take 500-1,000 mg by mouth every 6 (six) hours as needed (for pain/headaches.).     Marland Kitchen azelastine (ASTELIN) 0.1 % nasal spray Place 1 spray into both nostrils daily.  2  . carvedilol (COREG) 3.125 MG tablet Take 3.125 mg by mouth 2 (two) times daily with a meal.    . cetirizine (ZYRTEC) 10 MG tablet Take 10 mg by mouth daily as needed (for allergies/alternates with singulair).     . Cinacalcet HCl (SENSIPAR PO) Take by mouth.    . clonazePAM (KLONOPIN) 0.5 MG tablet Take 0.5 mg by mouth 2 (two) times daily as needed for anxiety.     . cyclobenzaprine (FLEXERIL) 5 MG tablet Take 1 tablet (5 mg total) by mouth 3 (three) times daily as needed for muscle spasms. (Patient taking  differently: Take 10 mg by mouth 3 (three) times daily as needed for muscle spasms. ) 30 tablet 0  . docusate sodium (COLACE) 100 MG capsule Take 2 capsules (200 mg total) by mouth 2 (two) times daily. (Patient taking differently: Take 200 mg by mouth 2 (two) times daily as needed (for constipation). ) 10 capsule 0  . Ferric Citrate (AURYXIA PO) Take by mouth.    . fluticasone (FLONASE) 50 MCG/ACT nasal spray Place 1 spray into the nose daily.    Marland Kitchen levocetirizine (XYZAL) 5 MG tablet Take 5 mg by mouth daily.  2  . loperamide (IMODIUM) 2 MG capsule Take 2-4 mg by mouth 3 (three) times daily as needed for diarrhea or loose stools.    . montelukast (SINGULAIR) 10 MG tablet Take 10 mg by mouth daily.  1  . pantoprazole (PROTONIX) 40 MG tablet TAKE 1 TABLET(40 MG) BY MOUTH DAILY 90 tablet 1  . phenylephrine (NEO-SYNEPHRINE) 0.5 % nasal solution Place 1 drop into both nostrils every 6 (six) hours as needed for congestion. 15 mL 1  . Spacer/Aero-Holding Chambers DEVI Use spacer with albuterol 1 each 0  . amLODipine (NORVASC) 10 MG tablet Take 10 mg by mouth daily with breakfast.  6  . calcium acetate (PHOSLO) 667 MG capsule Take 667 mg by mouth 3 (three) times daily with meals.     No facility-administered medications prior to visit.         Objective:   Physical Exam Vitals:   12/30/17 1212  BP: 116/80  Pulse: 83  SpO2: 95%  Weight: 164 lb (74.4 kg)  Height: 5\' 5"  (1.651 m)   Gen: Pleasant, well-nourished, in no distress,  normal affect  ENT: No lesions,  mouth clear,  oropharynx clear, no postnasal drip  Neck: No JVD, no stridor  Lungs: No use of accessory muscles, decreased at bases, no wheeze or crackles.   Cardiovascular: RRR, heart sounds normal, no murmur or gallops, no peripheral edema  Musculoskeletal: No deformities, no cyanosis or clubbing  Neuro: alert, non focal  Skin: Warm, no lesions or rash     Assessment & Plan:  Pulmonary hypertension ([place]) Work-up underway  at [facility].  She is  about to start tadalafil and Letairis.  She was also started on oxygen with exertion and at night while sleeping based on orthodeoxia noted on her catheterization.  Chronic cough With contributions from chronic rhinitis and also GERD.  She is not been taking her pantoprazole reliably and she will get back to this.  I recommended that she may want to go to twice a day if she has breakthrough even on a single dose daily.  She will continue her current allergy regimen.  She is having some difficulty tolerating the nasal steroid and Atrovent nasal spray due to nosebleeds.  She may ultimately need to go back to an allergist for immunotherapy.  Obstructive lung disease ([place]) She has mixed obstruction and restriction on her pulmonary function testing.  She did not respond significantly to a clinical trial of a scheduled bronchodilator, she still has albuterol available to use if needed.  She uses it minimally.  We will continue to keep it available, may decide to retry bronchodilators going forward.  It could be that her dyspnea is being driven by PAH, hypoxemia, clouding any contribution of her obstructive lung disease.  Baltazar Apo, MD, PhD 12/30/2017, 12:41 PM [facility] [phone] or if no answer [phone]

## 2017-12-30 NOTE — Assessment & Plan Note (Signed)
With contributions from chronic rhinitis and also GERD.  She is not been taking her pantoprazole reliably and she will get back to this.  I recommended that she may want to go to twice a day if she has breakthrough even on a single dose daily.  She will continue her current allergy regimen.  She is having some difficulty tolerating the nasal steroid and Atrovent nasal spray due to nosebleeds.  She may ultimately need to go back to an allergist for immunotherapy.

## 2017-12-30 NOTE — Assessment & Plan Note (Signed)
She has mixed obstruction and restriction on her pulmonary function testing.  She did not respond significantly to a clinical trial of a scheduled bronchodilator, she still has albuterol available to use if needed.  She uses it minimally.  We will continue to keep it available, may decide to retry bronchodilators going forward.  It could be that her dyspnea is being driven by PAH, hypoxemia, clouding any contribution of her obstructive lung disease.

## 2017-12-30 NOTE — Assessment & Plan Note (Signed)
Work-up underway at [facility].  She is about to start tadalafil and Letairis.  She was also started on oxygen with exertion and at night while sleeping based on orthodeoxia noted on her catheterization.

## 2017-12-30 NOTE — Patient Instructions (Addendum)
Agree with initiation of tadalafil and Letaris as planned by [facility]. Follow-up with them regarding your 6-minute walk, pulmonary pressures on the new medication. Please continue Xyzal, Benadryl, Singulair as you have been taking them. Try to remain consistent with your fluticasone nasal spray, Atrovent nasal spray as you are able tolerate.  You may need to hold the Atrovent if you have nose irritation or nosebleeding. Get back on your pantoprazole 40 mg every day.  Take this medication 1 hour before eating.  If you continue to have breakthrough reflux with associated cough then you can consider increasing to twice a day to see if there is any benefit. Keep your albuterol available to use 2 puffs if needed for shortness of breath, chest tightness, wheezing. We will not start any scheduled inhaled medication at this time. Follow with Dr Lamonte Sakai in 4 months or sooner if you have any problems.

## 2018-01-18 ENCOUNTER — Ambulatory Visit: Payer: Medicare Other | Admitting: Podiatry

## 2018-01-18 ENCOUNTER — Encounter: Payer: Self-pay | Admitting: Podiatry

## 2018-01-18 DIAGNOSIS — IMO0001 Reserved for inherently not codable concepts without codable children: Secondary | ICD-10-CM | POA: Insufficient documentation

## 2018-01-18 DIAGNOSIS — M778 Other enthesopathies, not elsewhere classified: Secondary | ICD-10-CM

## 2018-01-18 DIAGNOSIS — R600 Localized edema: Secondary | ICD-10-CM

## 2018-01-18 DIAGNOSIS — M779 Enthesopathy, unspecified: Secondary | ICD-10-CM | POA: Diagnosis not present

## 2018-01-18 DIAGNOSIS — Z79899 Other long term (current) drug therapy: Secondary | ICD-10-CM | POA: Insufficient documentation

## 2018-01-18 DIAGNOSIS — I872 Venous insufficiency (chronic) (peripheral): Secondary | ICD-10-CM | POA: Insufficient documentation

## 2018-01-18 DIAGNOSIS — E039 Hypothyroidism, unspecified: Secondary | ICD-10-CM | POA: Insufficient documentation

## 2018-01-18 DIAGNOSIS — E538 Deficiency of other specified B group vitamins: Secondary | ICD-10-CM | POA: Insufficient documentation

## 2018-01-18 DIAGNOSIS — J8417 Other interstitial pulmonary diseases with fibrosis in diseases classified elsewhere: Secondary | ICD-10-CM

## 2018-01-18 DIAGNOSIS — F09 Unspecified mental disorder due to known physiological condition: Secondary | ICD-10-CM | POA: Insufficient documentation

## 2018-01-18 DIAGNOSIS — G4730 Sleep apnea, unspecified: Secondary | ICD-10-CM | POA: Insufficient documentation

## 2018-01-18 DIAGNOSIS — E139 Other specified diabetes mellitus without complications: Secondary | ICD-10-CM | POA: Insufficient documentation

## 2018-01-18 DIAGNOSIS — J84178 Other interstitial pulmonary diseases with fibrosis in diseases classified elsewhere: Secondary | ICD-10-CM | POA: Insufficient documentation

## 2018-01-18 DIAGNOSIS — E559 Vitamin D deficiency, unspecified: Secondary | ICD-10-CM | POA: Insufficient documentation

## 2018-01-18 DIAGNOSIS — N039 Chronic nephritic syndrome with unspecified morphologic changes: Secondary | ICD-10-CM | POA: Insufficient documentation

## 2018-01-18 NOTE — Progress Notes (Signed)
Subjective:   Patient ID: Katie Nunez, female   DOB: 63 y.o.   MRN: [id number]   HPI Patient states she is been getting some itching in both feet and swelling of the low-grade nature and has compression stockings we have given her which helps some but not completely and she does have a long-term history of dialysis   ROS      Objective:  Physical Exam  Neurovascular status unchanged from previous visit with good digital perfusion noted with moderate edema in the ankle region left over right nonpitting in nature with no indications of varicosities or discoloration from this perspective.  Patient is well oriented     Assessment:  Possibility that this is related to the dialysis with fluid retention versus local condition with no indications of localized processes present     Plan:  Reviewed condition and recommended compression therapy to be continued along with elevation and continued monitoring of condition but do not have anything more extensive at the current time.  Reappoint as needed

## 2018-01-30 ENCOUNTER — Other Ambulatory Visit: Payer: Medicare Other

## 2018-01-31 ENCOUNTER — Encounter: Payer: Self-pay | Admitting: Oncology

## 2018-01-31 ENCOUNTER — Other Ambulatory Visit: Payer: Self-pay

## 2018-01-31 ENCOUNTER — Other Ambulatory Visit: Payer: Self-pay | Admitting: Oncology

## 2018-01-31 ENCOUNTER — Ambulatory Visit (INDEPENDENT_AMBULATORY_CARE_PROVIDER_SITE_OTHER): Payer: Medicare Other | Admitting: Oncology

## 2018-01-31 VITALS — BP 131/79 | HR 84 | Temp 97.7°F | Ht 66.0 in | Wt 164.2 lb

## 2018-01-31 DIAGNOSIS — Z91048 Other nonmedicinal substance allergy status: Secondary | ICD-10-CM

## 2018-01-31 DIAGNOSIS — D693 Immune thrombocytopenic purpura: Secondary | ICD-10-CM

## 2018-01-31 DIAGNOSIS — R5383 Other fatigue: Secondary | ICD-10-CM

## 2018-01-31 DIAGNOSIS — Z888 Allergy status to other drugs, medicaments and biological substances status: Secondary | ICD-10-CM

## 2018-01-31 DIAGNOSIS — M349 Systemic sclerosis, unspecified: Secondary | ICD-10-CM | POA: Diagnosis not present

## 2018-01-31 DIAGNOSIS — N186 End stage renal disease: Secondary | ICD-10-CM

## 2018-01-31 DIAGNOSIS — D696 Thrombocytopenia, unspecified: Secondary | ICD-10-CM | POA: Diagnosis not present

## 2018-01-31 DIAGNOSIS — M3500 Sicca syndrome, unspecified: Secondary | ICD-10-CM | POA: Diagnosis not present

## 2018-01-31 DIAGNOSIS — M351 Other overlap syndromes: Secondary | ICD-10-CM

## 2018-01-31 DIAGNOSIS — Z992 Dependence on renal dialysis: Secondary | ICD-10-CM

## 2018-01-31 DIAGNOSIS — Z9981 Dependence on supplemental oxygen: Secondary | ICD-10-CM

## 2018-01-31 DIAGNOSIS — N059 Unspecified nephritic syndrome with unspecified morphologic changes: Secondary | ICD-10-CM

## 2018-01-31 DIAGNOSIS — I7300 Raynaud's syndrome without gangrene: Secondary | ICD-10-CM

## 2018-01-31 DIAGNOSIS — D869 Sarcoidosis, unspecified: Secondary | ICD-10-CM

## 2018-01-31 DIAGNOSIS — D594 Other nonautoimmune hemolytic anemias: Secondary | ICD-10-CM

## 2018-01-31 DIAGNOSIS — M05719 Rheumatoid arthritis with rheumatoid factor of unspecified shoulder without organ or systems involvement: Secondary | ICD-10-CM | POA: Diagnosis not present

## 2018-01-31 DIAGNOSIS — M069 Rheumatoid arthritis, unspecified: Secondary | ICD-10-CM

## 2018-01-31 DIAGNOSIS — I2721 Secondary pulmonary arterial hypertension: Secondary | ICD-10-CM

## 2018-01-31 LAB — CBC WITH DIFFERENTIAL/PLATELET
Abs Immature Granulocytes: 0.02 10*3/uL (ref 0.00–0.07)
Basophils Absolute: 0 10*3/uL (ref 0.0–0.1)
Basophils Relative: 1 %
EOS ABS: 0.1 10*3/uL (ref 0.0–0.5)
EOS PCT: 2 %
HCT: 38 % (ref 36.0–46.0)
Hemoglobin: 11.9 g/dL — ABNORMAL LOW (ref 12.0–15.0)
Immature Granulocytes: 1 %
Lymphocytes Relative: 13 %
Lymphs Abs: 0.5 10*3/uL — ABNORMAL LOW (ref 0.7–4.0)
MCH: 29.4 pg (ref 26.0–34.0)
MCHC: 31.3 g/dL (ref 30.0–36.0)
MCV: 93.8 fL (ref 80.0–100.0)
Monocytes Absolute: 0.4 10*3/uL (ref 0.1–1.0)
Monocytes Relative: 12 %
Neutro Abs: 2.6 10*3/uL (ref 1.7–7.7)
Neutrophils Relative %: 71 %
Platelets: 90 10*3/uL — ABNORMAL LOW (ref 150–400)
RBC: 4.05 MIL/uL (ref 3.87–5.11)
RDW: 17.4 % — ABNORMAL HIGH (ref 11.5–15.5)
WBC: 3.6 10*3/uL — ABNORMAL LOW (ref 4.0–10.5)
nRBC: 0 % (ref 0.0–0.2)

## 2018-01-31 MED ORDER — TADALAFIL 20 MG PO TABS: 20.0000 mg | ORAL_TABLET | Freq: Every day | ORAL | 0 refills | Status: DC | PRN

## 2018-01-31 NOTE — Patient Instructions (Addendum)
To lab today We will check a platelet count at your dialysis center in early March and call you with result  We made a referral to a Hematologist at the [facility] center for your ongoing Hematology care. Please call [phone] and ask for scheduling.  It has been a pleasure working with you!  DrG

## 2018-02-01 ENCOUNTER — Telehealth: Payer: Self-pay | Admitting: *Deleted

## 2018-02-01 NOTE — Telephone Encounter (Signed)
Called pt - no answer; left message to call the office . 

## 2018-02-01 NOTE — Progress Notes (Signed)
Hematology and Oncology Follow Up Visit  Katie Nunez [id number] [birth year] 63 y.o. 02/01/2018 10:46 AM   Principle Diagnosis: Encounter Diagnoses  Name Primary?  . Pulmonary arterial hypertension ([place])   . Thrombocytopenia ([place]) Yes  . Other non-autoimmune hemolytic anemias ([place])   . Scleroderma ([place])   . Rheumatoid arthritis involving shoulder with positive rheumatoid factor, unspecified laterality ([place])   . Raynaud's disease without gangrene   Clinical summary: 63 year old woman I have been following since June, 2018when she developed unexplained sudden thrombocytopenia during an evaluation for end-stage renal disease now on dialysis. I initially thought she had hemolytic uremic syndrome. She had spontaneous improvement in her blood counts and platelet counts only to have a subsequent significant fall limited to the platelets. Ultimately diagnosis of ITP established by a very dramatic but short-lived response to steroids.At time of her March 04, 2017 visit with me, she agreed to a trial of Rituxananti-CD20 antibody.. We reviewed all potential side effects including rare chance of anaphylactic reactions, hepatitis B reactivation, and even more rare, activation of JC virus. Shewas hepatitis B negative as recently as February 24, 2017 and has been vaccinated against hepatitis B. She is hepatitis C negative as well. After the initial dose given intravenously to assess tolerance, I gave her the new subcutaneous formulation for the remaining 3 weekly doses. I kept her on steroids which were tapered down to 10 mg as of May 30, 2017. She had an excellent response to the Rituxan. She has been getting blood counts checked weekly at her dialysis center. Results forwarded to me. Platelet count was 31,000 on February 20. Now consistently above 200,000 with most recent value in our office on April 8 of 297,000 and recent value from the dialysis center of 339,000 on July 28, 2017. She tolerated the Rituxan well with no acute toxicities. She is now off all prednisone.  Interim History:   At time of her most recent visit with me in September 2019, she was under evaluation for a persistent cough.  Lung functions showed a restrictive pattern.  I felt she would benefit from a high-resolution CT scan of the chest to rule out pulmonary fibrosis in view of her underlying scleroderma and from pulmonary medicine evaluation.  A recent ear nose and throat evaluation with nasopharyngoscopy was unrevealing.  She was referred to Dr. Baltazar Apo who saw her in November 2019.  He felt she had a mixed obstructive and restrictive pathology.  Possible element of reflux related to her chronic cough and she was advised to go on Protonix. High-resolution CT scan of the chest on October 05, 2017 did not show evidence for interstitial lung disease.  Some areas of mild inflammatory changes in the right middle lobe and lingula.  She was ultimately evaluated at [facility].  She underwent a right heart cath on December 19.  She was told this was consistent with pulmonary hypertension.  She was put on oxygen 3 L.  Started Taldafanil 20 mg and is to start ambrisentan 5 mg daily. She continues to complain of profound fatigue. Platelet counts have been relatively stable and above 120,000.  She reports that her count done at her dialysis center last week was 111,000.  Repeat count in our lab today shows platelet count has drifted down to 90,000.  No clinical bleeding.   Medications: reviewed  Allergies:  Allergies  Allergen Reactions  . Savella [Milnacipran Hcl] Palpitations and Other (See Comments)    Unknown  . Tape Rash and Other (  See Comments)    Welts-- unsure if it was paper or adhesive tape    Review of Systems: See interim history:  Remaining ROS negative:   Physical Exam: Blood pressure 131/79, pulse 84, temperature 97.7 F (36.5 C), temperature source Oral, height 5\' 6"  (1.676 m),  weight 164 lb 3.2 oz (74.5 kg), last menstrual period 11/15/2008. Wt Readings from Last 3 Encounters:  01/31/18 164 lb 3.2 oz (74.5 kg)  12/30/17 164 lb (74.4 kg)  11/28/17 171 lb (77.6 kg)     General appearance: Well-nourished African-American woman HENNT: Pharynx no erythema, exudate, mass, or ulcer. No thyromegaly or thyroid nodules Lymph nodes: No cervical, supraclavicular, or axillary lymphadenopathy Breasts:  Lungs: Clear to auscultation, resonant to percussion throughout Heart: Regular rhythm, no murmur, no gallop, no rub, no click, no edema Abdomen: Soft, nontender, normal bowel sounds, no mass, no organomegaly Extremities: No edema, no calf tenderness Musculoskeletal: no joint deformities GU:  Vascular: Carotid pulses 2+, no bruits, c Neurologic: Alert, oriented, PERRLA, , cranial nerves grossly normal, motor strength 5 over 5, reflexes 1+ symmetric, upper body coordination normal, gait normal, Skin: No rash or ecchymosis.  No Raynaud's changes. Lab Results: CBC W/Diff    Component Value Date/Time   WBC 3.6 (L) 01/31/2018 1625   RBC 4.05 01/31/2018 1625   HGB 11.9 (L) 01/31/2018 1625   HGB 12.1 05/30/2017 1048   HCT 38.0 01/31/2018 1625   HCT 36.0 05/30/2017 1048   PLT 90 (L) 01/31/2018 1625   PLT 247 05/30/2017 1048   MCV 93.8 01/31/2018 1625   MCV 90 05/30/2017 1048   MCH 29.4 01/31/2018 1625   MCHC 31.3 01/31/2018 1625   RDW 17.4 (H) 01/31/2018 1625   RDW 16.5 (H) 05/30/2017 1048   LYMPHSABS 0.5 (L) 01/31/2018 1625   LYMPHSABS 0.8 05/30/2017 1048   MONOABS 0.4 01/31/2018 1625   EOSABS 0.1 01/31/2018 1625   EOSABS 0.0 05/30/2017 1048   BASOSABS 0.0 01/31/2018 1625   BASOSABS 0.0 05/30/2017 1048     Chemistry      Component Value Date/Time   NA 140 09/26/2017 1048   K 3.9 09/26/2017 1048   CL 93 (L) 09/26/2017 1048   CO2 25 09/26/2017 1048   BUN 47 (H) 09/26/2017 1048   CREATININE 6.83 (H) 09/26/2017 1048      Component Value Date/Time   CALCIUM  10.0 09/26/2017 1048   ALKPHOS 81 09/26/2017 1048   AST 29 09/26/2017 1048   ALT 22 09/26/2017 1048   BILITOT 0.3 09/26/2017 1048       Radiological Studies: No results found.  Impression:  1.  Chronic ITP likely related to underlying collagen vascular disorder Partial response to steroids.  Complete response to Rituxan.  Platelet count now trending down.  90,000 today compared with 135,000 on December 26, 2017. She could be retreated with Rituxan but in the absence of clinical bleeding, as long as platelet count stays consistently above 30,000, we can hold off on additional treatment.  I will check platelet count again in 1 week.  Subsequent interval to be determined by results.  2.  Collagen vascular disorder Overlap syndrome, Sjogren's, sarcoid, rheumatoid arthritis.  3.  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  4.  Recent diagnosis pulmonary hypertension December 2019 started on a trial of 12 FML and ambrisentan.   I will transition her follow-up hematology  care to Dr. Truitt Merle at the [facility] lung [facility] center.  CC: Patient Care Team: [name], MD as PCP - General Beryle Beams Alyson Locket, MD as Consulting Physician (Oncology) [place], Arvilla Meres, MD as Consulting Physician (Vascular Surgery) Center, [facility], Hassell Done, MD as Consulting Physician (Nephrology) Leavy Cella, MD as Referring Physician (Internal Medicine)   Murriel Hopper, MD, FACP  Hematology-Oncology/Internal Medicine     1/22/202010:46 AM

## 2018-02-01 NOTE — Telephone Encounter (Signed)
-----   Message from Annia Belt, MD sent at 01/31/2018  6:45 PM EST ----- Call pt: platelets 90,000; need to repeat next week on Wednesday in our office, I believe this is not a day she gets dialysis.

## 2018-02-01 NOTE — Telephone Encounter (Signed)
Pt informed "platelets 90,000; need to repeat next week on Wednesday in our office." per Dr Beryle Beams.  Lab appt scheduled 1/29 @ 1100 AM here in the clinic.

## 2018-02-08 ENCOUNTER — Other Ambulatory Visit (INDEPENDENT_AMBULATORY_CARE_PROVIDER_SITE_OTHER): Payer: Medicare Other

## 2018-02-08 DIAGNOSIS — D696 Thrombocytopenia, unspecified: Secondary | ICD-10-CM

## 2018-02-08 LAB — CBC WITH DIFFERENTIAL/PLATELET
Abs Immature Granulocytes: 0.01 10*3/uL (ref 0.00–0.07)
BASOS ABS: 0 10*3/uL (ref 0.0–0.1)
Basophils Relative: 1 %
Eosinophils Absolute: 0 10*3/uL (ref 0.0–0.5)
Eosinophils Relative: 1 %
HCT: 36.9 % (ref 36.0–46.0)
Hemoglobin: 11.5 g/dL — ABNORMAL LOW (ref 12.0–15.0)
Immature Granulocytes: 0 %
Lymphocytes Relative: 19 %
Lymphs Abs: 0.5 10*3/uL — ABNORMAL LOW (ref 0.7–4.0)
MCH: 29.3 pg (ref 26.0–34.0)
MCHC: 31.2 g/dL (ref 30.0–36.0)
MCV: 93.9 fL (ref 80.0–100.0)
Monocytes Absolute: 0.3 10*3/uL (ref 0.1–1.0)
Monocytes Relative: 11 %
NEUTROS ABS: 1.8 10*3/uL (ref 1.7–7.7)
Neutrophils Relative %: 68 %
Platelets: 111 10*3/uL — ABNORMAL LOW (ref 150–400)
RBC: 3.93 MIL/uL (ref 3.87–5.11)
RDW: 16.8 % — ABNORMAL HIGH (ref 11.5–15.5)
WBC: 2.7 10*3/uL — ABNORMAL LOW (ref 4.0–10.5)
nRBC: 0 % (ref 0.0–0.2)

## 2018-02-10 ENCOUNTER — Telehealth: Payer: Self-pay | Admitting: *Deleted

## 2018-02-10 NOTE — Telephone Encounter (Signed)
Pt called / informed "platelets better, up from 90,000 to 111,000. Can wait 1 month to repeat" per Dr Beryle Beams. Stated they will do labs at the kidney ctr.

## 2018-02-10 NOTE — Telephone Encounter (Signed)
-----   Message from Annia Belt, MD sent at 02/08/2018  5:45 PM EST ----- Call pt: platelets better, up from 90,000 to 111,000. Can wait 1 month to repeat

## 2018-02-10 NOTE — Telephone Encounter (Signed)
OK noted.

## 2018-02-10 NOTE — Telephone Encounter (Signed)
Called pt - no answer; left message to give me a call back. 

## 2018-02-21 ENCOUNTER — Telehealth: Payer: Self-pay | Admitting: Emergency Medicine

## 2018-02-21 NOTE — Telephone Encounter (Signed)
Patient would like to know what her results Ct from  12/05/17 are.   Routing to [place] she is aware he is working in the hospital.

## 2018-02-22 NOTE — Telephone Encounter (Signed)
Please let her know that the CT sinuses showed some mild inflammation in the ethmoid sinuses but there was no evidence for acute or chronic sinusitis that we needed to treat - that's why we held off on abx, etc.

## 2018-02-22 NOTE — Telephone Encounter (Signed)
Called and spoke with patient, she is aware of response below and verbalized understanding. Patient stated that she had no questions. Nothing further needed.

## 2018-02-22 NOTE — Telephone Encounter (Signed)
Will route to RB for follow up.

## 2018-02-23 ENCOUNTER — Telehealth: Payer: Self-pay | Admitting: *Deleted

## 2018-02-23 NOTE — Telephone Encounter (Signed)
Called pt - informed plt ct of 151; stated dialysis ctr had told her of this result. Also wanted to know appt w/new doctor; informed she will be seeing Dr Alen Blew (per referral) and to call the cancer ctr @ [phone],ask for [facility].

## 2018-02-23 NOTE — Telephone Encounter (Signed)
Patients are not getting appointments despite my referrals. I am afraid this is going to explode. DrG

## 2018-02-23 NOTE — Telephone Encounter (Signed)
-----   Message from Annia Belt, MD sent at 02/22/2018  6:13 PM EST ----- Regarding: RE: Lab result Great. Thanks. Can you please call her w result? DrG ----- Message ----- From: Ebbie Latus, RN Sent: 02/22/2018   3:09 PM EST To: Annia Belt, MD Subject: Lab result                                     Dr Darnell Level Received lab result from kidney ctr - Platelet count 151 , done on 2/6. I will put it in your box. Thanks [employer]

## 2018-03-06 ENCOUNTER — Telehealth: Payer: Self-pay | Admitting: Emergency Medicine

## 2018-03-06 NOTE — Telephone Encounter (Signed)
Noted. Will close this encounter.  

## 2018-03-06 NOTE — Telephone Encounter (Signed)
Pt returning call. Per Pt, she found the number for th MyChart help desk and was able to access her MyChart.

## 2018-03-10 ENCOUNTER — Ambulatory Visit: Payer: Medicare Other | Attending: Family Medicine | Admitting: Physical Therapy

## 2018-03-10 ENCOUNTER — Other Ambulatory Visit: Payer: Self-pay

## 2018-03-10 ENCOUNTER — Telehealth: Payer: Self-pay | Admitting: Oncology

## 2018-03-10 ENCOUNTER — Encounter: Payer: Self-pay | Admitting: Oncology

## 2018-03-10 DIAGNOSIS — M25571 Pain in right ankle and joints of right foot: Secondary | ICD-10-CM | POA: Diagnosis present

## 2018-03-10 DIAGNOSIS — R2681 Unsteadiness on feet: Secondary | ICD-10-CM

## 2018-03-10 DIAGNOSIS — R600 Localized edema: Secondary | ICD-10-CM | POA: Diagnosis present

## 2018-03-10 DIAGNOSIS — M25572 Pain in left ankle and joints of left foot: Secondary | ICD-10-CM

## 2018-03-10 NOTE — Telephone Encounter (Signed)
Pt cld to reschedule appt to see Dr. Alen Blew to 3/27 at [facility] letter created and mailed.

## 2018-03-10 NOTE — Telephone Encounter (Signed)
A new hem appt has been scheduled for the pt to see Dr. Beryle Beams on 3/31 at 11am. Letter mailed to the pt.

## 2018-03-10 NOTE — Therapy (Signed)
[facility] [place], [place], [address] Phone: [phone]   Fax:  [phone]  Physical Therapy Evaluation  Patient Details  Name: Katie Nunez MRN: [id number] Date of Birth: 02-13-1955 Referring Provider (PT): [name] MD   Encounter Date: 03/10/2018  PT End of Session - 03/10/18 0849    Visit Number  1    Date for PT Re-Evaluation  04/21/18    PT Start Time  0849    PT Stop Time  0931    PT Time Calculation (min)  42 min    Activity Tolerance  Patient tolerated treatment well    Behavior During Therapy  [facility] for tasks assessed/performed       Past Medical History:  Diagnosis Date  . Achalasia   . Anxiety   . Dysplasia of cervix, low grade (CIN 1)   . Environmental allergies    "all year long" (12/27/2016)  . ESRD (end stage renal disease) on dialysis [facility])    "TTS; [place] [facility]" (12/27/2016)  . Fibromyalgia   . GERD (gastroesophageal reflux disease)   . Gout   . Hypertension   . IBS (irritable bowel syndrome)   . MVP (mitral valve prolapse)   . RA (rheumatoid arthritis) (HCC)    FOLLOWED BY DR. SHANAHAN  . Raynaud's disease   . Scleroderma ([place])   . Seasonal allergies   . Thrombocytopenia ([place]) 07/01/2016   Acute fall to 13,000 07/01/16  . Tubular adenoma 01/08/2008   CECUM  . Vitamin D deficiency     Past Surgical History:  Procedure Laterality Date  . ANKLE FRACTURE SURGERY Right   . AV FISTULA PLACEMENT Left 06/28/2016   Procedure: left arm ARTERIOVENOUS (AV) FISTULA CREATION;  Surgeon: Rosetta Posner, MD;  Location: [facility];  Service: Vascular;  Laterality: Left;  . BASCILIC VEIN TRANSPOSITION Left 09/27/2016   Procedure: LEFT UPPER ARM CEPHALIC VEIN TRANSPOSITION;  Surgeon: Rosetta Posner, MD;  Location: [facility];  Service: Vascular;  Laterality: Left;  . BREAST BIOPSY     "? side"  . CESAREAN SECTION  1994  . CO2 LASER OF CERVIX    . COLONOSCOPY W/ BIOPSIES  01/08/2008  .  INSERTION OF DIALYSIS CATHETER Right 06/28/2016   Procedure: INSERTION OF DIALYSIS CATHETER, right internal jugular;  Surgeon: Rosetta Posner, MD;  Location: [facility];  Service: Vascular;  Laterality: Right;  . MYOMECTOMY    . PELVIC LAPAROSCOPY  2011  . superficial thrombophlebitis Left 07-2014    There were no vitals filed for this visit.   Subjective Assessment - 03/10/18 0851    Subjective  Patient has scleroderma. She is having problems with her ankles which she thinks is a combination of her RA and scleroderma and possibly a side effect of one of her medications. This started about 2-3 months ago. She reports tightness and feels it across the top of her ankle and also in achilles tendons.  She reports tingling and pain intermittently.    Pertinent History  Scleroderma, RA, pulmonary HTN, kidney disease (dialysis), Rt ankle fx/dislocation/surgery 2000    Patient Stated Goals  decrease tightness to prevent falls.     Currently in Pain?  Yes    Pain Score  6     Pain Location  Ankle    Pain Orientation  Right;Left    Pain Descriptors / Indicators  Tightness;Sharp    Pain Type  Acute pain    Pain Onset  More than a month ago    Pain Frequency  Intermittent    Aggravating Factors   prolonged sitting    Pain Relieving Factors  stretching    Effect of Pain on Daily Activities  painful         [facility] PT Assessment - 03/10/18 0001      Assessment   Medical Diagnosis  lower extremity tendinopathy; scleroderma    Referring Provider (PT)  [name] MD    Onset Date/Surgical Date  12/11/17    Next MD Visit  none scheduled      Precautions   Precautions  None      Restrictions   Weight Bearing Restrictions  No      Balance Screen   Has the patient fallen in the past 6 months  No    Has the patient had a decrease in activity level because of a fear of falling?   No    Is the patient reluctant to leave their home because of a fear of falling?   No      Home Engineer, building services residence    Living Arrangements  Spouse/significant other    Available Help at Discharge  Family    Type of [place] to enter    Entrance Stairs-Number of Steps  2    Entrance Stairs-Rails  Can reach both    [place] to live on main level with bedroom/bathroom      Prior Function   Level of Independence  Independent      Observation/Other Assessments-Edema    Edema  Circumferential      Circumferential Edema   Circumferential - Right  53 6/8 cm    Circumferential - Left   54 6/8 cm      ROM / Strength   AROM / PROM / Strength  AROM;PROM;Strength      AROM   AROM Assessment Site  Ankle    Right/Left Ankle  Right;Left    Right Ankle Dorsiflexion  0    Right Ankle Plantar Flexion  54    Right Ankle Inversion  12    Right Ankle Eversion  28    Left Ankle Dorsiflexion  -2    Left Ankle Plantar Flexion  45    Left Ankle Inversion  23    Left Ankle Eversion  28      PROM   PROM Assessment Site  Ankle    Right/Left Ankle  Right;Left    Right Ankle Dorsiflexion  8    Right Ankle Plantar Flexion  60    Right Ankle Inversion  19    Right Ankle Eversion  30    Left Ankle Dorsiflexion  5    Left Ankle Plantar Flexion  53    Left Ankle Inversion  35    Left Ankle Eversion  48      Strength   Overall Strength Comments  bil ankles 5/5 in available range      Flexibility   Soft Tissue Assessment /Muscle Length  --   bil hips WNL; tight HS bil     Palpation   Palpation comment  bil medial gastroc, soleus, bil malleoli, achilles insertion R>L, tender PF      Balance   Balance Assessed  --   SLS less than 3 sec bil  Objective measurements completed on examination: See above findings.              PT Education - 03/10/18 1222    Education Details  HEP    Person(s) Educated  Patient    Methods  Explanation;Handout;Demonstration    Comprehension  Verbalized understanding;Returned  demonstration       PT Short Term Goals - 03/10/18 1230      PT SHORT TERM GOAL #1   Title  Ind with initial HeP    Time  2    Period  Weeks        PT Long Term Goals - 03/10/18 1230      PT LONG TERM GOAL #1   Title  Patient to report decreased pain in bil ankles by 50% or more with ADLS.    Time  6    Status  New    Target Date  04/21/18      PT LONG TERM GOAL #2   Title  Patient to demo improved bil ankle DF to 8 degrees actively to normalize ADLS    Time  6    Status  New      PT LONG TERM GOAL #3   Title  Improved SLS to 10 seconds bil to decrease fall risk.     Time  6    Period  Weeks    Status  New      PT LONG TERM GOAL #4   Title  decrease edema in bil ankles by 2 cm to help ease pain/improve motion    Time  6    Period  Weeks    Status  New             Plan - 03/10/18 1223    Clinical Impression Statement  Patient presents today with c/o of bil ankle pain beginning 2-3 months ago. She has scleroderma, RA and kidney failure which all may be contributing to her pain. She has decreased ROM in the ankle and decreased balance with SLS. She is strong in bil ankles within her available range. Patient also has edema and intermittent numbness in her ankles as well. She would like to decrease these symptoms and get looser in order to prevent falls in the future.    History and Personal Factors relevant to plan of care:  Scleroderma, RA, pulmonary HTN, kidney disease (dialysis), Rt ankle fx/dislocation/surgery 2000    Clinical Presentation  Evolving    Clinical Presentation due to:  worsening sx, multiple comorbidities    Clinical Decision Making  Moderate    Rehab Potential  Good    PT Frequency  2x / week    PT Duration  6 weeks    PT Treatment/Interventions  ADLs/Self Care Home Management;Cryotherapy;Electrical Stimulation;Ultrasound;Therapeutic exercise;Balance training;Patient/family education;Manual techniques;Passive range of motion;Dry needling    PT  Next Visit Plan  ROM/flexibility bil ankles, balance activities; retrograde massage/manual to lower legs; modalities prn    PT Home Exercise Plan  ankle ROM, gastroc/soleus stretch    Consulted and Agree with Plan of Care  Patient       Patient will benefit from skilled therapeutic intervention in order to improve the following deficits and impairments:  Pain, Postural dysfunction, Decreased range of motion, Decreased balance, Increased edema, Impaired flexibility  Visit Diagnosis: Pain in left ankle and joints of left foot - Plan: PT plan of care cert/re-cert  Pain in right ankle and joints of right foot - Plan: PT plan of care  cert/re-cert  Localized edema - Plan: PT plan of care cert/re-cert  Unsteadiness on feet - Plan: PT plan of care cert/re-cert     Problem List Patient Active Problem List   Diagnosis Date Noted  . Encounter for long-term (current) use of other medications 01/18/2018  . Hypothyroidism 01/18/2018  . Myalgia and myositis 01/18/2018  . Chronic nephritis 01/18/2018  . Other interstitial pulmonary diseases with fibrosis in diseases classified elsewhere ([place]) 01/18/2018  . Other long term (current) drug therapy 01/18/2018  . Other specified diabetes mellitus without complications ([place]) 35/78/9784  . Sleep apnea 01/18/2018  . Unspecified persistent mental disorders due to conditions classified elsewhere 01/18/2018  . Venous reflux 01/18/2018  . Vitamin B12 deficiency 01/18/2018  . Vitamin D deficiency 01/18/2018  . Obstructive lung disease ([place]) 12/30/2017  . 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 Chronic cough 11/11/2017  . Pulmonary hypertension ([place]) 11/11/2017  . Pulmonary arterial hypertension ([place]) 10/07/2017  . Acute ITP ([place]) 01/05/2017  . ESRD (end stage renal disease) ([place]) 12/28/2016  . Hemolytic anemia ([place]) 07/26/2016  . Epistaxis, recurrent 07/26/2016  . Thrombocytopenia ([place]) 07/01/2016   . ARF (acute renal failure) ([place]) 06/25/2016  . Anxiety 06/25/2016  . Bilateral carpal tunnel syndrome 10/22/2015  . Chronic gout without tophus 10/22/2015  . Chronic nonintractable headache 10/08/2015  . Fibroid uterus 01/03/2012  . H/O vitamin D deficiency 01/03/2012  . Post-menopausal 01/03/2012  . Hereditary and idiopathic peripheral neuropathy 11/19/2011  . Intestinal malabsorption 11/19/2011  . Lichen planus 78/41/2820  . Low back pain 11/19/2011  . Diffuse spasm of esophagus 11/11/2011  . ESR raised 11/11/2011  . Postinflammatory pulmonary fibrosis ([place]) 11/11/2011  . Scleroderma ([place]) 11/16/2010  . Rheumatoid arthritis ([place]) 11/16/2010  . Raynaud's disease 11/16/2010  . Symptomatic menopausal or female climacteric states 11/16/2010    Madelyn Flavors PT 03/10/2018, 12:39 PM  [place] [address] [place], [place], [address] Phone: [phone]   Fax:  [phone]  Name: Katie Nunez MRN: [id number] Date of Birth: 11-11-1955

## 2018-03-10 NOTE — Patient Instructions (Signed)
Ankle Alphabet   Using left ankle and foot only, trace the letters of the alphabet. Perform A to Z. Repeat _1___ times per set. Do ____ sets per session. Do __2-3__ sessions per day.  http://orth.exer.us/16   Copyright  VHI. All rights reserved.    Ankle Circles   Slowly rotate right foot and ankle clockwise then counterclockwise. Gradually increase range of motion. Avoid pain. Circle __10__ times each direction per set. Do ____ sets per session. Do 2-3____ sessions per day.  http://orth.exer.us/30   Copyright  VHI. All rights reserved.    ROM: Plantar / Dorsiflexion   With left leg relaxed, gently flex and extend ankle. Move through full range of motion. Avoid pain. Repeat __20__ times per set. Do ____ sets per session. Do __3-4__ sessions per day.  http://orth.exer.us/34   Copyright  VHI. All rights reserved.    ROM: Inversion / Eversion   With left leg relaxed, gently turn ankle and foot in and out. Move through full range of motion. Avoid pain. Repeat _20___ times per set. Do ____ sets per session. Do _3-4___ sessions per day.   Gastroc Stretch   Stand with right foot back, leg straight, forward leg bent. Keeping heel on floor, turned slightly out, lean into wall until stretch is felt in calf. Hold _30-60___ seconds. Repeat _3___ times per set. Do ____ sets per session. Do __2__ sessions per day.  http://orth.exer.us/27   Copyright  VHI. All rights reserved.   Soleus Stretch    Stand with right foot back, both knees bent. Keeping heel on floor, turned slightly out, lean into wall until stretch is felt in lower calf. Hold _30-60___ seconds. Repeat __3__ times per set. Do ____ sets per session. Do _2___ sessions per day.  http://orth.exer.us/25   Copyright  VHI. All rights reserved.   Madelyn Flavors, PT 03/10/18 9:31 AM [address] [place], [place], [address] Phone:  [phone]   Fax:  [phone]   .

## 2018-03-13 ENCOUNTER — Encounter: Payer: Self-pay | Admitting: *Deleted

## 2018-04-07 ENCOUNTER — Inpatient Hospital Stay: Payer: Medicare Other | Attending: Oncology | Admitting: Oncology

## 2018-04-07 ENCOUNTER — Other Ambulatory Visit: Payer: Self-pay

## 2018-04-07 ENCOUNTER — Telehealth: Payer: Self-pay | Admitting: Oncology

## 2018-04-07 VITALS — BP 131/81 | HR 103 | Temp 98.4°F | Resp 18 | Ht 66.0 in | Wt 162.1 lb

## 2018-04-07 DIAGNOSIS — N186 End stage renal disease: Secondary | ICD-10-CM

## 2018-04-07 DIAGNOSIS — D631 Anemia in chronic kidney disease: Secondary | ICD-10-CM

## 2018-04-07 DIAGNOSIS — D693 Immune thrombocytopenic purpura: Secondary | ICD-10-CM

## 2018-04-07 DIAGNOSIS — D696 Thrombocytopenia, unspecified: Secondary | ICD-10-CM | POA: Diagnosis not present

## 2018-04-07 DIAGNOSIS — D6959 Other secondary thrombocytopenia: Secondary | ICD-10-CM

## 2018-04-07 NOTE — Progress Notes (Signed)
Reason for the request:   Thrombocytopenia  HPI: I was asked by Dr. Beryle Beams to evaluate Katie Nunez for thrombocytopenia.  She is a 63 year old woman currently of [place] where she lived for over 30 years.  She has history of scleroderma and rheumatoid arthritis and developed thrombocytopenia in February 2019.  She was treated initially with steroids and subsequently treated with rituximab under the care of Dr. Beryle Beams.  She received intravenous rituximab and subsequently subcutaneous injections and prednisone taper concluded on May 28 of 2019.  She had excellent response to both treatments with platelet counts in the last year have ranged as high as 245 and as low as 29 January 2018.  On January 29 of 2020 her platelet count was 111.  She is on hemodialysis and she has her labs checked periodically and her most recent CBC last week showed a platelet count of 137.  She has no reported symptoms at this time.  She denies any bleeding, bruising or recent hospitalizations.  She does have chronic cough related to postnasal drip which has been an issue for the last year or so.  She has evaluation by pulmonary as well as ENT without any clear resolution.  It was thought may be related to rhinitis and GERD.  She does not report any headaches, blurry vision, syncope or seizures. Does not report any fevers, chills or sweats.  Does not report any cough, wheezing or hemoptysis.  Does not report any chest pain, palpitation, orthopnea or leg edema.  Does not report any nausea, vomiting or abdominal pain.  Does not report any constipation or diarrhea.  Does not report any skeletal complaints.    Does not report frequency, urgency or hematuria.  Does not report any skin rashes or lesions. Does not report any heat or cold intolerance.  Does not report any lymphadenopathy or petechiae.  Does not report any anxiety or depression.  Remaining review of systems is negative.    Past Medical History:  Diagnosis Date  .  Achalasia   . Anxiety   . Dysplasia of cervix, low grade (CIN 1)   . Environmental allergies    "all year long" (12/27/2016)  . ESRD (end stage renal disease) on dialysis [facility])    "TTS; [place]" (12/27/2016)  . Fibromyalgia   . GERD (gastroesophageal reflux disease)   . Gout   . Hypertension   . IBS (irritable bowel syndrome)   . MVP (mitral valve prolapse)   . RA (rheumatoid arthritis) (HCC)    FOLLOWED BY DR. SHANAHAN  . Raynaud's disease   . Scleroderma ([place])   . Seasonal allergies   . Thrombocytopenia ([place]) 07/01/2016   Acute fall to 13,000 07/01/16  . Tubular adenoma 01/08/2008   CECUM  . Vitamin D deficiency   :  Past Surgical History:  Procedure Laterality Date  . ANKLE FRACTURE SURGERY Right   . AV FISTULA PLACEMENT Left 06/28/2016   Procedure: left arm ARTERIOVENOUS (AV) FISTULA CREATION;  Surgeon: Rosetta Posner, MD;  Location: [place];  Service: Vascular;  Laterality: Left;  . BASCILIC VEIN TRANSPOSITION Left 09/27/2016   Procedure: LEFT UPPER ARM CEPHALIC VEIN TRANSPOSITION;  Surgeon: Rosetta Posner, MD;  Location: [place];  Service: Vascular;  Laterality: Left;  . BREAST BIOPSY     "? side"  . CESAREAN SECTION  1994  . CO2 LASER OF CERVIX    . COLONOSCOPY W/ BIOPSIES  01/08/2008  . INSERTION OF DIALYSIS CATHETER Right 06/28/2016   Procedure: INSERTION OF  DIALYSIS CATHETER, right internal jugular;  Surgeon: Rosetta Posner, MD;  Location: [place];  Service: Vascular;  Laterality: Right;  . MYOMECTOMY    . PELVIC LAPAROSCOPY  2011  . superficial thrombophlebitis Left 07-2014  :   Current Outpatient Medications:  .  acetaminophen (TYLENOL) 500 MG tablet, Take 500-1,000 mg by mouth every 6 (six) hours as needed (for pain/headaches.). , Disp: , Rfl:  .  albuterol (PROAIR HFA) 108 (90 Base) MCG/ACT inhaler, as needed, Disp: , Rfl:  .  Albuterol Sulfate (PROAIR RESPICLICK) 956 (90 Base) MCG/ACT AEPB, Inhale into the lungs., Disp: , Rfl:  .  ambrisentan (LETAIRIS) 5 MG  tablet, Take by mouth., Disp: , Rfl:  .  amLODipine (NORVASC) 10 MG tablet, Take by mouth., Disp: , Rfl:  .  azelastine (ASTELIN) 0.1 % nasal spray, Place 1 spray into both nostrils daily., Disp: , Rfl: 2 .  calcium acetate (PHOSLO) 667 MG capsule, Take by mouth., Disp: , Rfl:  .  carvedilol (COREG) 3.125 MG tablet, Take 3.125 mg by mouth 2 (two) times daily with a meal., Disp: , Rfl:  .  cetirizine (ZYRTEC) 10 MG tablet, Take 10 mg by mouth daily as needed (for allergies/alternates with singulair). , Disp: , Rfl:  .  Cinacalcet HCl (SENSIPAR PO), Take by mouth., Disp: , Rfl:  .  clonazePAM (KLONOPIN) 0.5 MG tablet, Take 0.5 mg by mouth 2 (two) times daily as needed for anxiety. , Disp: , Rfl:  .  cyclobenzaprine (FLEXERIL) 5 MG tablet, Take 1 tablet (5 mg total) by mouth 3 (three) times daily as needed for muscle spasms. (Patient taking differently: Take 10 mg by mouth 3 (three) times daily as needed for muscle spasms. ), Disp: 30 tablet, Rfl: 0 .  docusate sodium (COLACE) 100 MG capsule, Take 2 capsules (200 mg total) by mouth 2 (two) times daily. (Patient taking differently: Take 200 mg by mouth 2 (two) times daily as needed (for constipation). ), Disp: 10 capsule, Rfl: 0 .  Ferric Citrate (AURYXIA PO), Take by mouth., Disp: , Rfl:  .  fluticasone (FLONASE) 50 MCG/ACT nasal spray, Place 1 spray into the nose daily., Disp: , Rfl:  .  gabapentin (NEURONTIN) 100 MG capsule, Take 100 mg by mouth 2 (two) times daily., Disp: , Rfl:  .  ipratropium (ATROVENT) 0.03 % nasal spray, 2 (two) times daily, Disp: , Rfl:  .  levocetirizine (XYZAL) 5 MG tablet, Take 5 mg by mouth daily., Disp: , Rfl: 2 .  loperamide (IMODIUM) 2 MG capsule, Take 2-4 mg by mouth 3 (three) times daily as needed for diarrhea or loose stools., Disp: , Rfl:  .  montelukast (SINGULAIR) 10 MG tablet, Take 10 mg by mouth daily., Disp: , Rfl: 1 .  multivitamin (RENA-VIT) TABS tablet, once daily, Disp: , Rfl:  .  pantoprazole (PROTONIX) 40  MG tablet, TAKE 1 TABLET(40 MG) BY MOUTH DAILY, Disp: 90 tablet, Rfl: 1 .  phenylephrine (NEO-SYNEPHRINE) 0.5 % nasal solution, Place 1 drop into both nostrils every 6 (six) hours as needed for congestion., Disp: 15 mL, Rfl: 1 .  Spacer/Aero-Holding Chambers DEVI, Use spacer with albuterol, Disp: 1 each, Rfl: 0 .  tadalafil (CIALIS) 20 MG tablet, Take 1 tablet (20 mg total) by mouth daily as needed for erectile dysfunction. (Patient taking differently: Take 20 mg by mouth 2 (two) times daily. ), Disp: 10 tablet, Rfl: 0:  Allergies  Allergen Reactions  . Savella [Milnacipran Hcl] Palpitations and Other (See Comments)  Unknown  . Tape Rash and Other (See Comments)    Welts-- unsure if it was paper or adhesive tape  :  Family History  Problem Relation Age of Onset  . Hypertension Mother   . Diabetes Mother   . Heart disease Father   . Hypertension Maternal Aunt   . Diabetes Maternal Grandmother   . Heart disease Paternal Grandfather   . Cerebral palsy Cousin        1ST COUSIN?  . Diabetes Paternal Grandmother   :  Social History   Socioeconomic History  . Marital status: Married    Spouse name: Not on file  . Number of children: Not on file  . Years of education: Not on file  . Highest education level: Not on file  Occupational History  . Not on file  Social Needs  . Financial resource strain: Not on file  . Food insecurity:    Worry: Not on file    Inability: Not on file  . Transportation needs:    Medical: Not on file    Non-medical: Not on file  Tobacco Use  . Smoking status: Never Smoker  . Smokeless tobacco: Never Used  Substance and Sexual Activity  . Alcohol use: No  . Drug use: No  . Sexual activity: Not Currently    Birth control/protection: Post-menopausal  Lifestyle  . Physical activity:    Days per week: Not on file    Minutes per session: Not on file  . Stress: Not on file  Relationships  . Social connections:    Talks on phone: Not on file     Gets together: Not on file    Attends religious service: Not on file    Active member of club or organization: Not on file    Attends meetings of clubs or organizations: Not on file    Relationship status: Not on file  . Intimate partner violence:    Fear of current or ex partner: Not on file    Emotionally abused: Not on file    Physically abused: Not on file    Forced sexual activity: Not on file  Other Topics Concern  . Not on file  Social History Narrative  . Not on file  :  Pertinent items are noted in HPI.  Exam: Blood pressure 131/81, pulse (!) 103, temperature 98.4 F (36.9 C), resp. rate 18, height 5\' 6"  (1.676 m), weight 162 lb 1.6 oz (73.5 kg), last menstrual period 11/15/2008.  ECOG 1 General appearance: alert and cooperative appeared without distress. Head: atraumatic without any abnormalities. Eyes: conjunctivae/corneas clear. PERRL.  Sclera anicteric. Throat: lips, mucosa, and tongue normal; without oral thrush or ulcers. Resp: clear to auscultation bilaterally without rhonchi, wheezes or dullness to percussion. Cardio: regular rate and rhythm, S1, S2 normal, no murmur, click, rub or gallop GI: soft, non-tender; bowel sounds normal; no masses,  no organomegaly Skin: Skin color, texture, turgor normal. No rashes or lesions Lymph nodes: Cervical, supraclavicular, and axillary nodes normal. Neurologic: Grossly normal without any motor, sensory or deep tendon reflexes. Musculoskeletal: No joint deformity or effusion.  CBC    Component Value Date/Time   WBC 2.7 (L) 02/08/2018 1640   RBC 3.93 02/08/2018 1640   HGB 11.5 (L) 02/08/2018 1640   HGB 12.1 05/30/2017 1048   HCT 36.9 02/08/2018 1640   HCT 36.0 05/30/2017 1048   PLT 111 (L) 02/08/2018 1640   PLT 247 05/30/2017 1048   MCV 93.9 02/08/2018 1640   MCV  90 05/30/2017 1048   MCH 29.3 02/08/2018 1640   MCHC 31.2 02/08/2018 1640   RDW 16.8 (H) 02/08/2018 1640   RDW 16.5 (H) 05/30/2017 1048   LYMPHSABS 0.5  (L) 02/08/2018 1640   LYMPHSABS 0.8 05/30/2017 1048   MONOABS 0.3 02/08/2018 1640   EOSABS 0.0 02/08/2018 1640   EOSABS 0.0 05/30/2017 1048   BASOSABS 0.0 02/08/2018 1640   BASOSABS 0.0 05/30/2017 1048    Assessment and Plan:   63 year old woman with the following:  1.  Thrombocytopenia diagnosed in February 2019.  She was treated with steroid that was tapered in May 2019 and received rituximab weekly for 4 weeks with a complete response.  Her recent platelets have remained close to remission with a platelet count of 137,000 last week.  The natural course of her illness as well as treatment options in the future were discussed today.  At this time, she does not require any intervention unless she develops a relapse of her immune thrombocytopenia.  Indication for treatment will be platelet count of less than 30,000 or active bleeding is noted.  Treatment options would be retreatment with steroids, IVIG, splenectomy and possibly platelet stimulating agents (Nplate, etc.).  For the time being I recommended continued monitoring and she will return for repeat evaluation in 4 months.  She will fax me the results of her regular CBC obtained with dialysis.  2.  Anemia: Multifactorial in nature mostly related to her chronic disease and renal insufficiency.  She is receiving intravenous iron with dialysis as needed.  3.  Follow-up: We will be in 4 months and sooner if needed.  40  minutes was spent with the patient face-to-face today.  More than 50% of time was dedicated to discussing the natural course of her disease, laboratory data review and answering questions regarding future plan of care.     Thank you for the referral.  A copy of this consult has been forwarded to the requesting physician.

## 2018-04-07 NOTE — Telephone Encounter (Signed)
Scheduled appt per 3/27 los.

## 2018-04-11 ENCOUNTER — Encounter: Payer: Medicare Other | Admitting: Oncology

## 2018-05-09 IMAGING — US US RENAL
1 series · 14 of 25 positions shown · non-contrast
Comparison: None.

CLINICAL DATA: Acute renal failure.

EXAM:
RENAL / URINARY TRACT ULTRASOUND COMPLETE

[Series 1: us renal · 0.23mm/px · 14 of 45 slices shown]
[im 1/45]
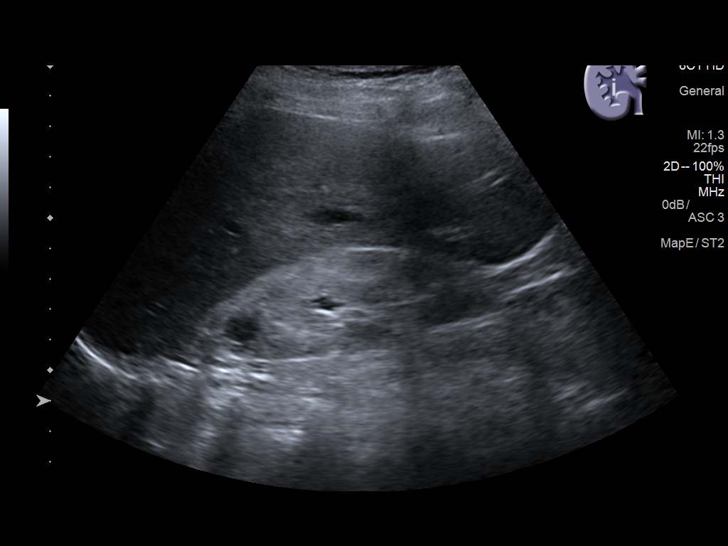
[im 4/45]
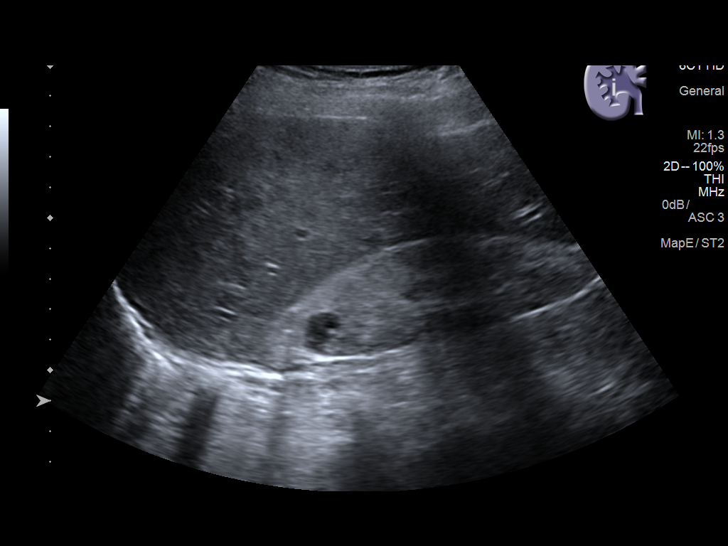
[im 8/45]
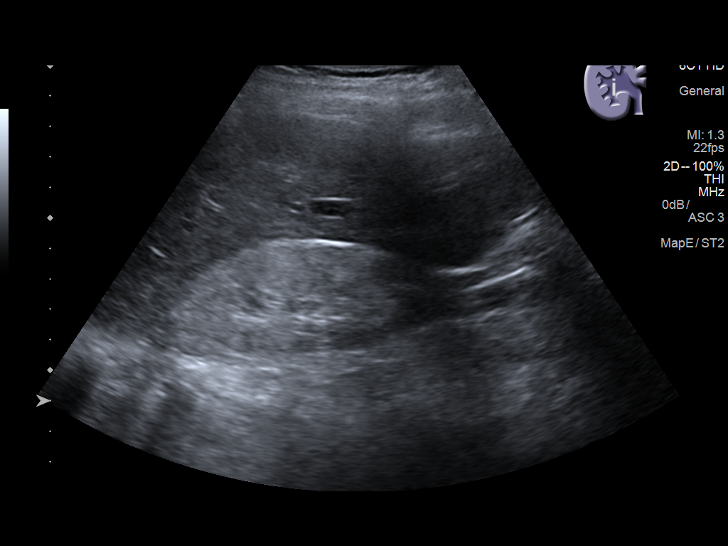
[im 12/45]
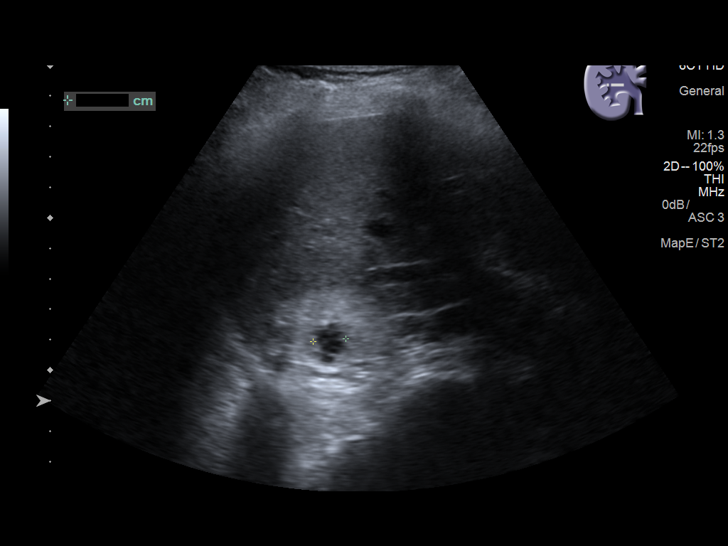
[im 15/45]
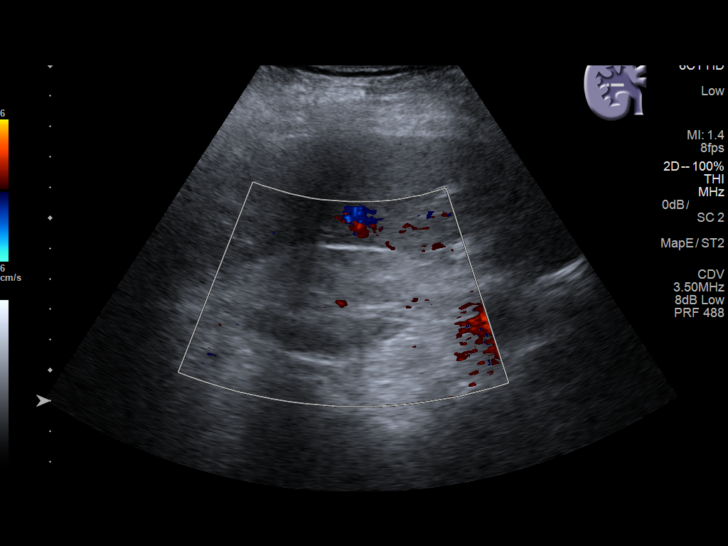
[im 17/45]
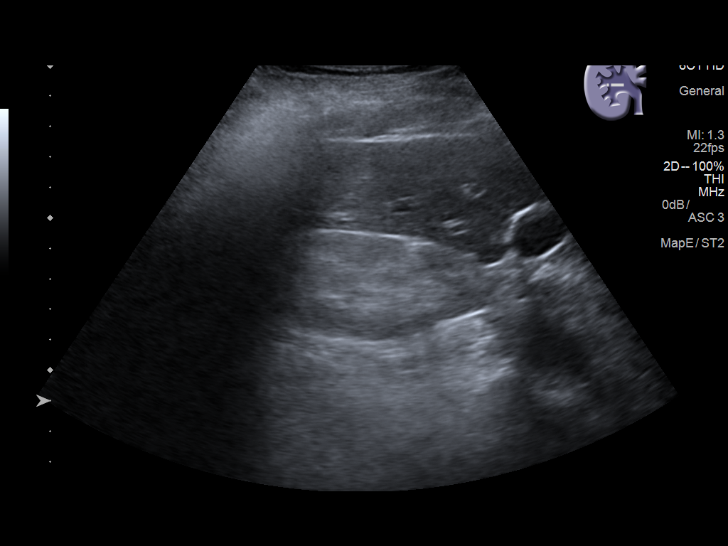
[im 21/45]
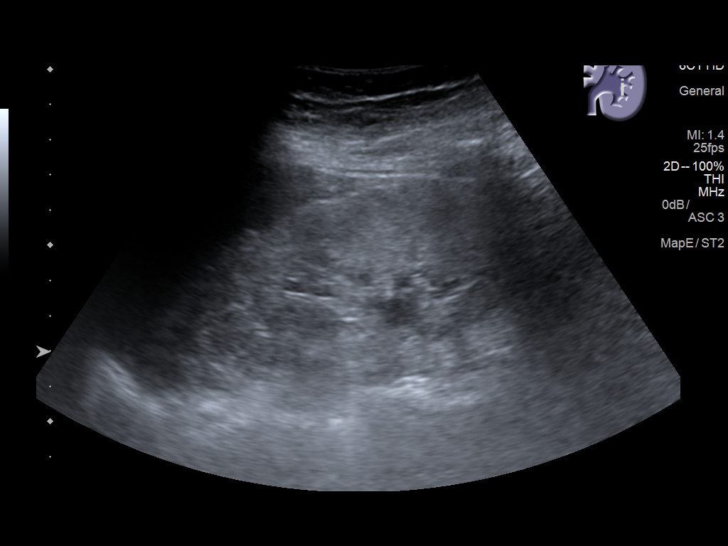
[im 24/45]
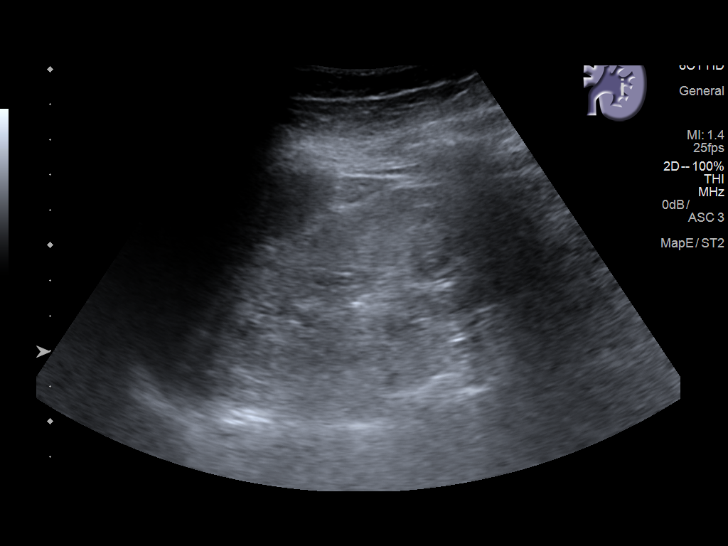
[im 28/45]
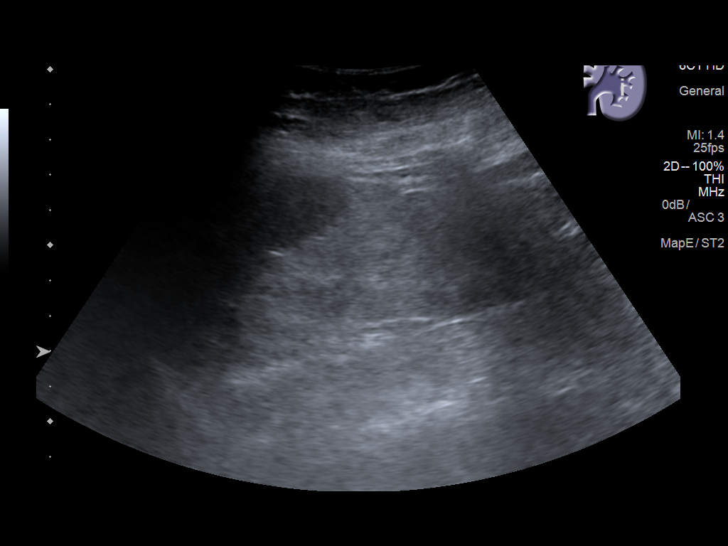
[im 30/45]
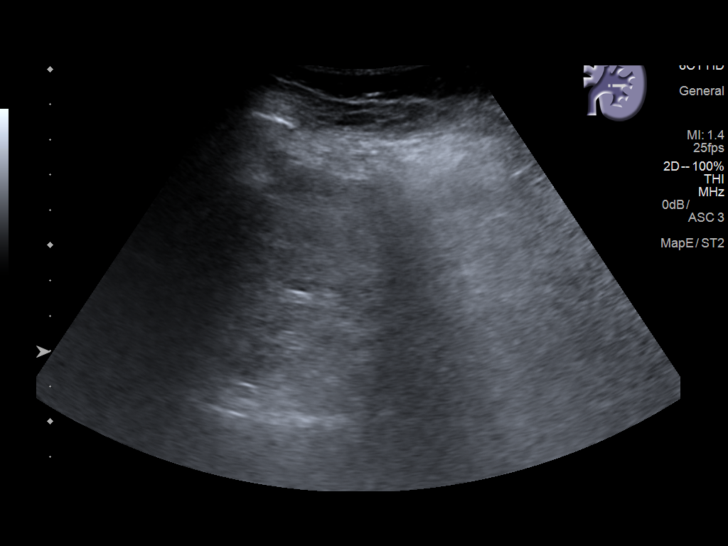
[im 34/45]
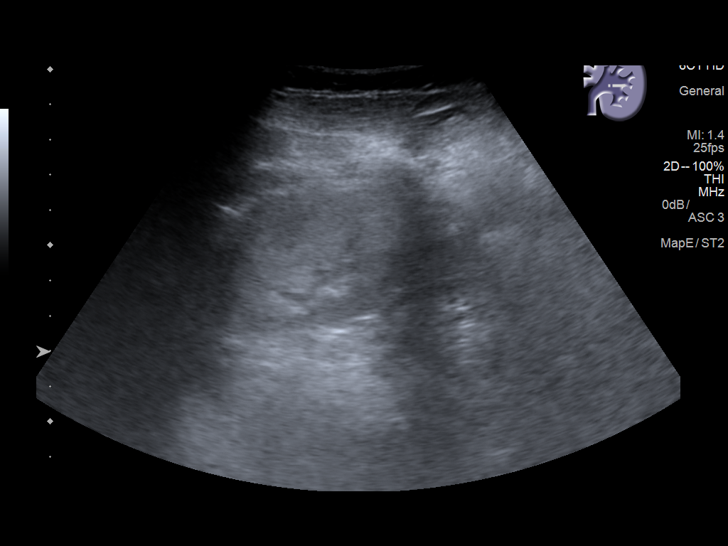
[im 37/45]
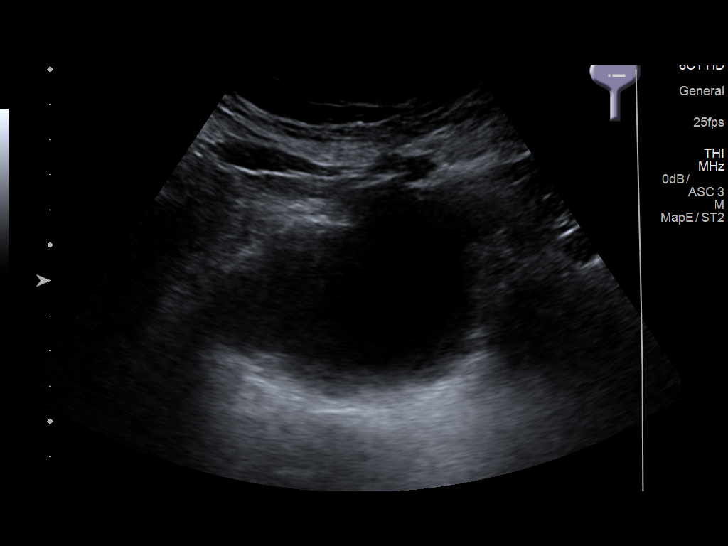
[im 41/45]
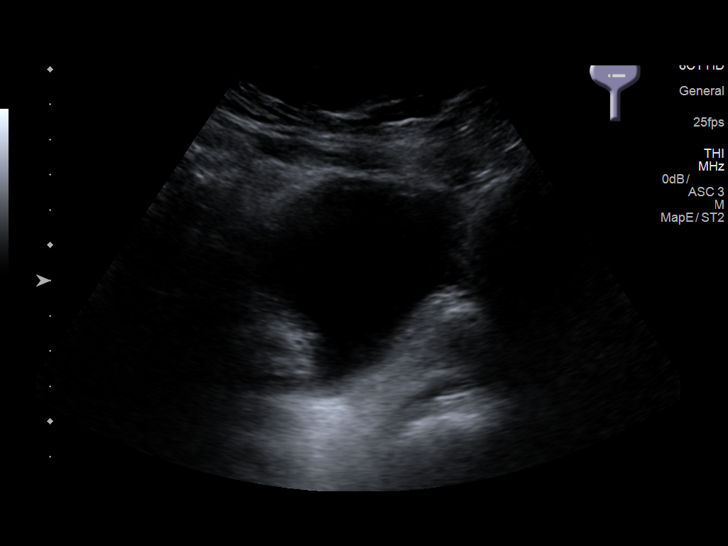
[im 45/45]
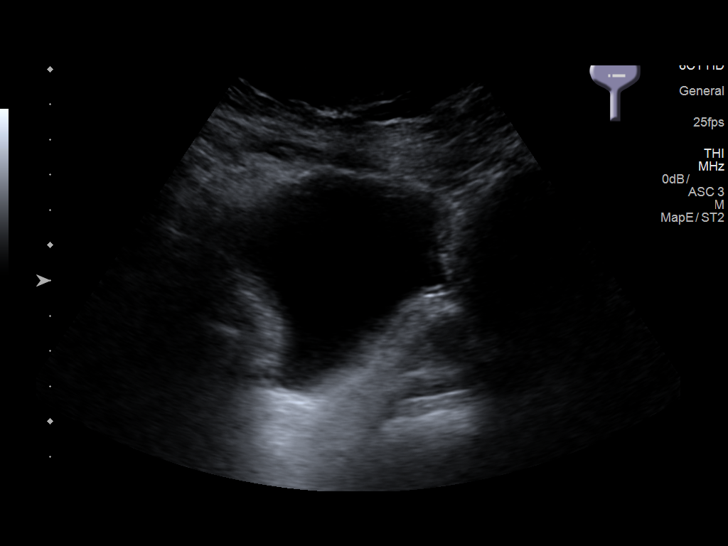

[14 of 25 positions shown; findings below may reference images not displayed]

FINDINGS: Right Kidney:

Length: 10.9 cm. Diffusely increased echogenicity of the cortex.
Mildly complicated cyst is seen in the superior pole of the right
kidney measuring 1.2 x 1.2 x 1.1 cm.

Left Kidney:

Length: 10.4 cm.  Diffusely increased echogenicity.

Bladder:

Appears normal for degree of bladder distention.
IMPRESSION: Markedly increased echogenicity of the bilateral kidneys, consistent
with intrinsic renal disease.

## 2018-05-11 ENCOUNTER — Telehealth: Payer: Self-pay | Admitting: *Deleted

## 2018-05-11 NOTE — Telephone Encounter (Signed)
Patient called and requested appointment to be seen by [facility] service"Puffy lumps that come and go on bilateral ankles". I asked her if she has had this seem by PCP, renal or dermatologist? She stated she has seen them all with no real answer or resolution of problem and very frustrated and desires to seen. Appointment given with NP.

## 2018-05-12 ENCOUNTER — Telehealth (HOSPITAL_COMMUNITY): Payer: Self-pay | Admitting: Rehabilitation

## 2018-05-12 NOTE — Telephone Encounter (Signed)
The above patient or their representative was contacted and gave the following answers to these questions:         Do you have any of the following symptoms? Pt states she has a cough but it is due to allergies and drainage.   Fever                    Cough                   Shortness of breath  Do  you have any of the following other symptoms? No   muscle pain         vomiting,        diarrhea        rash         weakness        red eye        abdominal pain         bruising          bruising or bleeding              joint pain           severe headache    Have you been in contact with someone who was or has been sick in the past 2 weeks? No  Yes                 Unsure                         Unable to assess   Does the person that you were in contact with have any of the following symptoms?   Cough         shortness of breath           muscle pain         vomiting,            diarrhea            rash            weakness           fever            red eye           abdominal pain           bruising  or  bleeding                joint pain                severe headache               Have you  or someone you have been in contact with traveled internationally in th last month? No         If yes, which countries?   Have you  or someone you have been in contact with traveled outside [place] in th last month? No         If yes, which state and city?   COMMENTS OR ACTION PLAN FOR THIS PATIENT:  Pt will wear mask to appt

## 2018-05-13 IMAGING — US US BIOPSY
1 series · 13 of 15 positions shown · non-contrast
Comparison: none

INDICATION: Renal failure

[Series 1: us biopsy · 0.22mm/px · 13 of 15 slices shown]
[im 1/15]
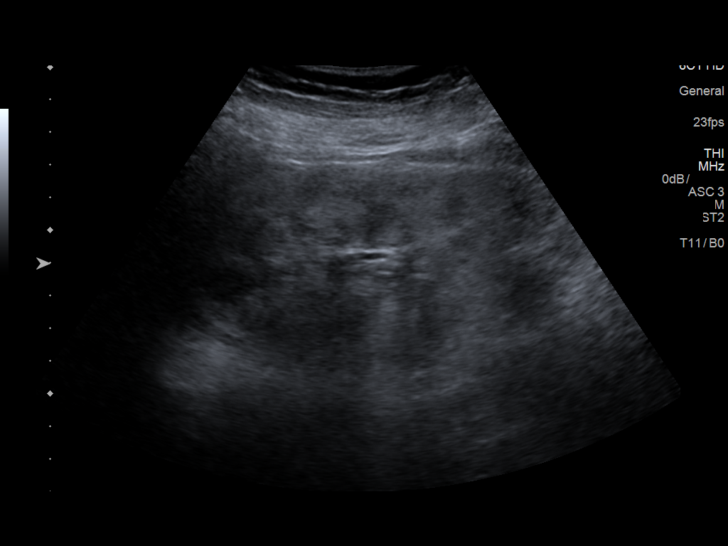
[im 2/15]
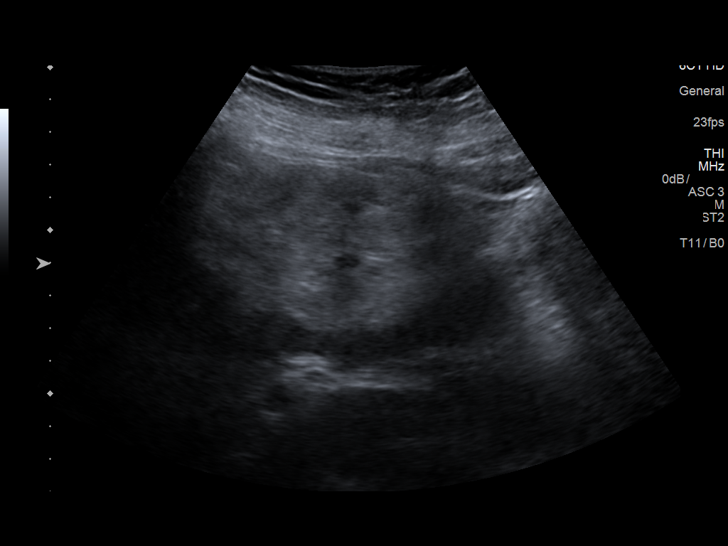
[im 3/15]
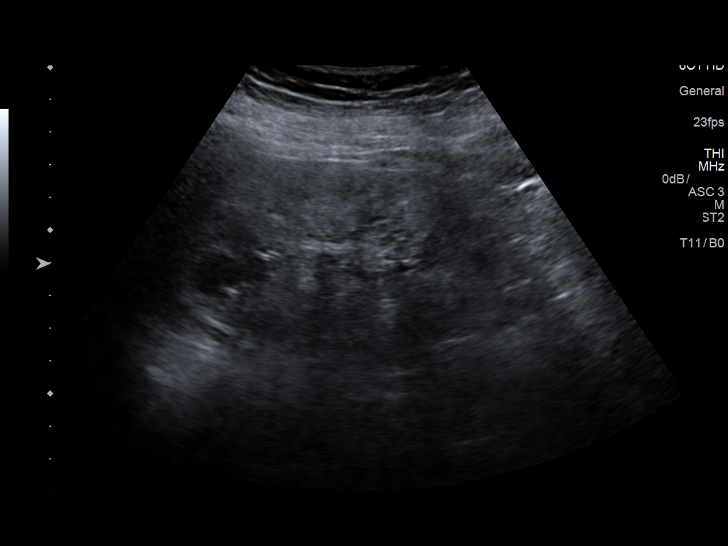
[im 5/15]
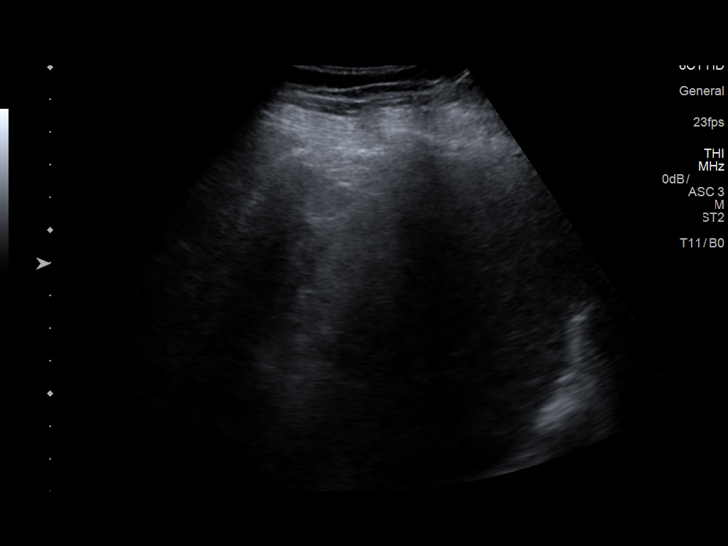
[im 6/15]
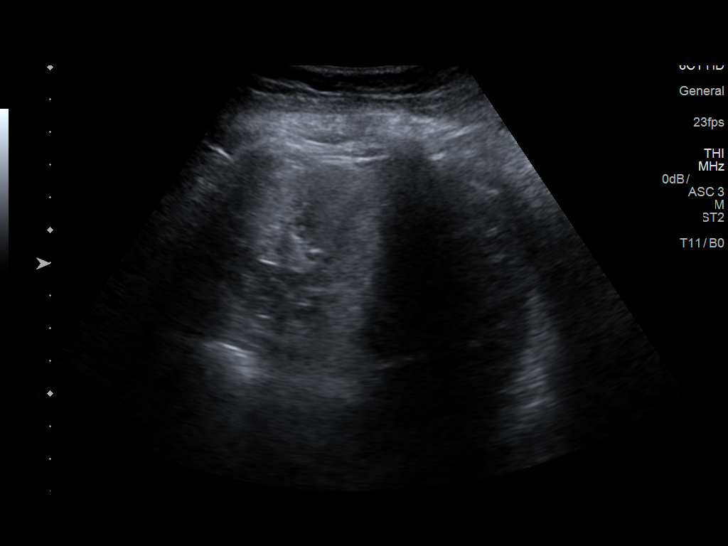
[im 7/15]
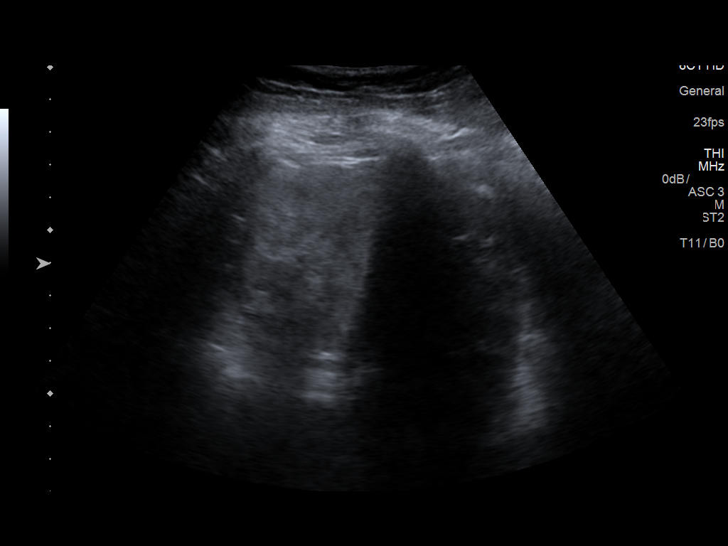
[im 8/15]
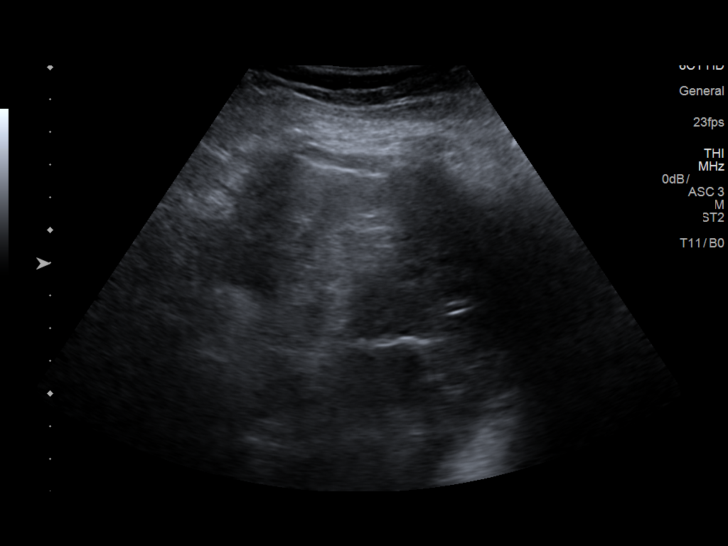
[im 9/15]
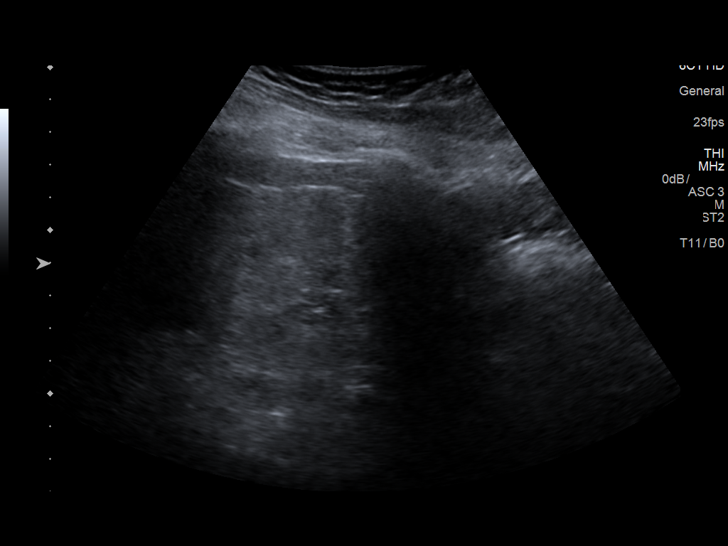
[im 10/15]
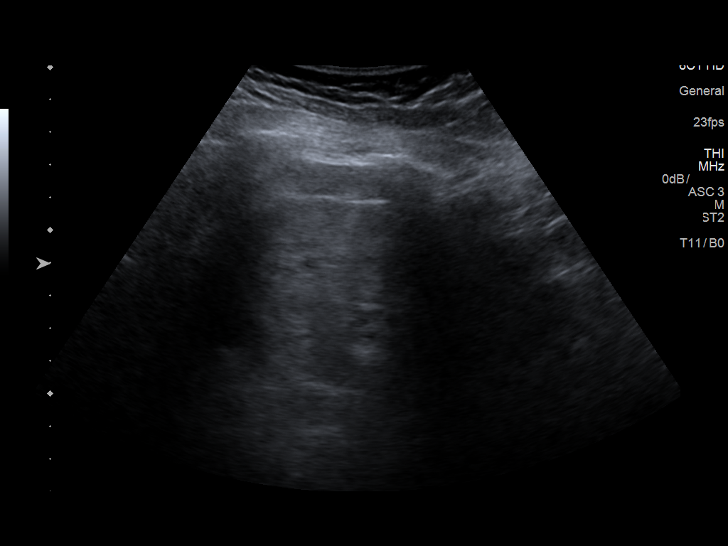
[im 11/15]
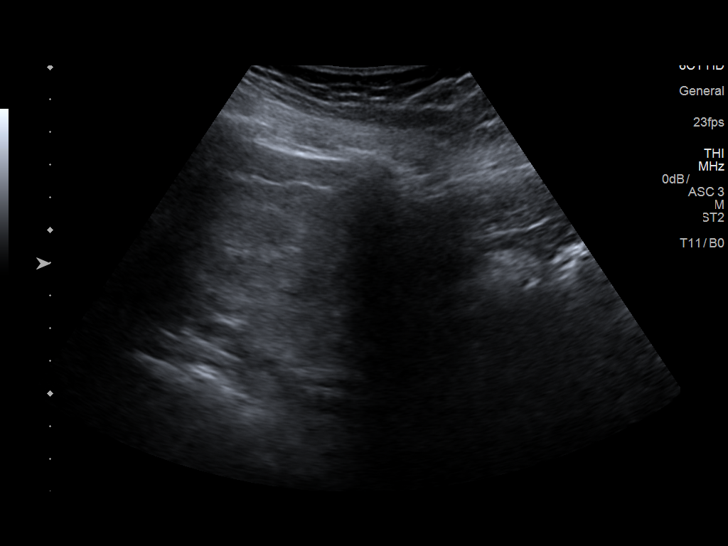
[im 13/15]
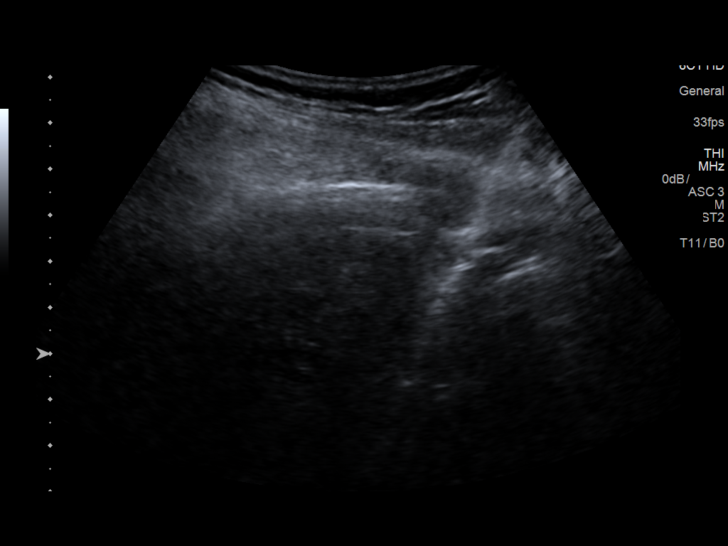
[im 14/15]
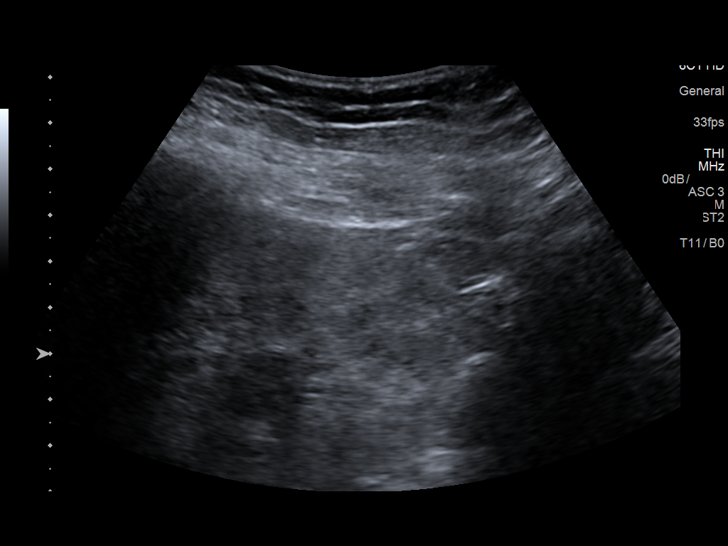
[im 15/15]
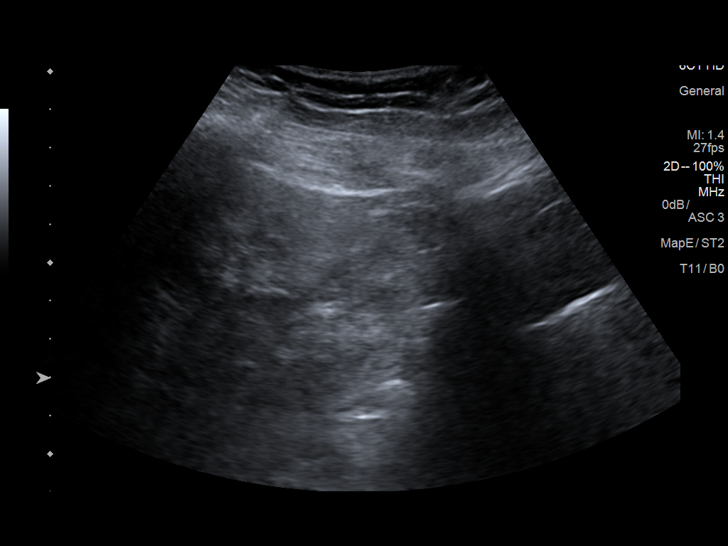

[13 of 15 positions shown; findings below may reference images not displayed]

EXAM:
ULTRASOUND-GUIDED BIOPSY RANDOM RENAL CORE BIOPSY

MEDICATIONS:
None.

ANESTHESIA/SEDATION:
Fentanyl 50 mcg IV; Versed 1 mg IV

Moderate Sedation Time:  10

The patient was continuously monitored during the procedure by the
interventional radiology nurse under my direct supervision.

FLUOROSCOPY TIME:  None

COMPLICATIONS:
None immediate.

PROCEDURE:
Informed written consent was obtained from the patient after a
thorough discussion of the procedural risks, benefits and
alternatives. All questions were addressed. Maximal Sterile Barrier
Technique was utilized including caps, mask, sterile gowns, sterile
gloves, sterile drape, hand hygiene and skin antiseptic. A timeout
was performed prior to the initiation of the procedure.

The back was prepped with ChloraPrep in a sterile fashion, and a
sterile drape was applied covering the operative field. A sterile
gown and sterile gloves were used for the procedure.

Under sonographic guidance, two 16 gauge core biopsies of the right
renal lower pole cortex were obtained. Final imaging was performed.

Patient tolerated the procedure well without complication. Vital
sign monitoring by nursing staff during the procedure will continue
as patient is in the special procedures unit for post procedure
observation.
FINDINGS: The images document guide needle placement within the right renal
lower pole cortex. Post biopsy images demonstrate no hemorrhage.
IMPRESSION: Successful ultrasound-guided random renal cortex core biopsy.

## 2018-05-15 ENCOUNTER — Ambulatory Visit: Payer: Medicare Other | Admitting: Family

## 2018-05-15 ENCOUNTER — Encounter: Payer: Self-pay | Admitting: Family

## 2018-05-15 ENCOUNTER — Other Ambulatory Visit: Payer: Self-pay

## 2018-05-15 VITALS — BP 129/84 | HR 90 | Temp 97.6°F | Resp 14 | Ht 65.0 in | Wt 158.4 lb

## 2018-05-15 DIAGNOSIS — M25572 Pain in left ankle and joints of left foot: Secondary | ICD-10-CM

## 2018-05-15 DIAGNOSIS — M79671 Pain in right foot: Secondary | ICD-10-CM | POA: Diagnosis not present

## 2018-05-15 DIAGNOSIS — M79672 Pain in left foot: Secondary | ICD-10-CM

## 2018-05-15 DIAGNOSIS — I87303 Chronic venous hypertension (idiopathic) without complications of bilateral lower extremity: Secondary | ICD-10-CM

## 2018-05-15 DIAGNOSIS — I770 Arteriovenous fistula, acquired: Secondary | ICD-10-CM

## 2018-05-15 DIAGNOSIS — N186 End stage renal disease: Secondary | ICD-10-CM

## 2018-05-15 DIAGNOSIS — Z992 Dependence on renal dialysis: Secondary | ICD-10-CM

## 2018-05-15 DIAGNOSIS — M349 Systemic sclerosis, unspecified: Secondary | ICD-10-CM

## 2018-05-15 DIAGNOSIS — M25571 Pain in right ankle and joints of right foot: Secondary | ICD-10-CM | POA: Diagnosis not present

## 2018-05-15 NOTE — Progress Notes (Signed)
CC: feet and lower legs complaints with history of mild venous insufficiency and ESRD on HD  History of Present Illness  Katie Nunez is a 63 y.o. (May 12, 1955) female who is well-known to Dr. Donnetta Hutching from prior AV access.  She had cephalic vein superficial mobilization on 09/27/2016.  She has had access of this on several occasions but has had infiltration on 2 separate occasions.   She also has a long-standing history of heaviness and tiredness in both lower extremities.  Dr. Donnetta Hutching last evaluated pt on 11-22-16. At that time left upper arm fistula had an excellent thrill. She had a 2+ left and right radial pulse. Lower extremity examination was unchanged with some telangiectasia but no lower extremity swelling. She had a repeat lower extremity venous reflux evaluation that day and this showed no evidence of DVT; had some reflux in her great and small saphenous vein, these were not dilated. Size maturation of her cephalic vein and hopefully this would be usable.  Dr. Donnetta Hutching encouraged her to be persistent to have the most talented access staff use her fistula until it was fully established;  He also explained that there was no evidence of DVT and no correctable cause of her lower extremity tiredness.  She was having some mild numbness and tingling in her left hand.  She had a normal radial pulse but may be demonstrating early steal symptoms progress.  Otherwise pt was to return on an as needed basis.  She returns today with c/o intermittant itching and sharp pains in her ankles and dorsal aspects of her feet that will wake her up at night, left worse than right. She also has some concerns about small superficial lumps at her left ankle, and soft lumps in both lower legs.   She states that she had Raynaud's Disease, scleroderma, and gout that are managed by her rheumatologist.  She had a right heart cath at [facility] In November 2019, Dr. Gilles Chiquito, and is being treated for pulmonary hypertension. She  takes tadalafil and ambristentan for this. She is on 3L O2/Crellin at home. She states her breathing is better and her walking distance has improved since taking these medications.  She has never used tobacco.  She is right hand dominant.  She states that her left upper arm AVF is working well, she has occassional numbness in her left hand.    Past Medical History:  Diagnosis Date  . Achalasia   . Anxiety   . Dysplasia of cervix, low grade (CIN 1)   . Environmental allergies    "all year long" (12/27/2016)  . ESRD (end stage renal disease) on dialysis [facility])    "TTS; [place]" (12/27/2016)  . Fibromyalgia   . GERD (gastroesophageal reflux disease)   . Gout   . Hypertension   . IBS (irritable bowel syndrome)   . MVP (mitral valve prolapse)   . RA (rheumatoid arthritis) (HCC)    FOLLOWED BY DR. SHANAHAN  . Raynaud's disease   . Scleroderma ([place])   . Seasonal allergies   . Thrombocytopenia ([place]) 07/01/2016   Acute fall to 13,000 07/01/16  . Tubular adenoma 01/08/2008   CECUM  . Vitamin D deficiency     Social History Social History   Tobacco Use  . Smoking status: Never Smoker  . Smokeless tobacco: Never Used  Substance Use Topics  . Alcohol use: No  . Drug use: No    Family History Family History  Problem Relation Age of Onset  .  Hypertension Mother   . Diabetes Mother   . Heart disease Father   . Hypertension Maternal Aunt   . Diabetes Maternal Grandmother   . Heart disease Paternal Grandfather   . Cerebral palsy Cousin        1ST COUSIN?  . Diabetes Paternal Grandmother     Surgical History Past Surgical History:  Procedure Laterality Date  . ANKLE FRACTURE SURGERY Right   . AV FISTULA PLACEMENT Left 06/28/2016   Procedure: left arm ARTERIOVENOUS (AV) FISTULA CREATION;  Surgeon: Rosetta Posner, MD;  Location: [place];  Service: Vascular;  Laterality: Left;  . BASCILIC VEIN TRANSPOSITION Left 09/27/2016   Procedure: LEFT UPPER ARM CEPHALIC VEIN TRANSPOSITION;   Surgeon: Rosetta Posner, MD;  Location: [place];  Service: Vascular;  Laterality: Left;  . BREAST BIOPSY     "? side"  . CESAREAN SECTION  1994  . CO2 LASER OF CERVIX    . COLONOSCOPY W/ BIOPSIES  01/08/2008  . INSERTION OF DIALYSIS CATHETER Right 06/28/2016   Procedure: INSERTION OF DIALYSIS CATHETER, right internal jugular;  Surgeon: Rosetta Posner, MD;  Location: [place];  Service: Vascular;  Laterality: Right;  . MYOMECTOMY    . PELVIC LAPAROSCOPY  2011  . superficial thrombophlebitis Left 07-2014    Allergies  Allergen Reactions  . Savella [Milnacipran Hcl] Palpitations and Other (See Comments)    Unknown  . Tape Rash and Other (See Comments)    Welts-- unsure if it was paper or adhesive tape    Current Outpatient Medications  Medication Sig Dispense Refill  . acetaminophen (TYLENOL) 500 MG tablet Take 500-1,000 mg by mouth every 6 (six) hours as needed (for pain/headaches.).     Marland Kitchen albuterol (PROAIR HFA) 108 (90 Base) MCG/ACT inhaler as needed    . Albuterol Sulfate (PROAIR RESPICLICK) 767 (90 Base) MCG/ACT AEPB Inhale into the lungs.    Marland Kitchen ambrisentan (LETAIRIS) 5 MG tablet Take by mouth.    Marland Kitchen amLODipine (NORVASC) 10 MG tablet Take by mouth.    Marland Kitchen azelastine (ASTELIN) 0.1 % nasal spray Place 1 spray into both nostrils daily.  2  . calcium acetate (PHOSLO) 667 MG capsule Take by mouth.    . carvedilol (COREG) 3.125 MG tablet Take 3.125 mg by mouth 2 (two) times daily with a meal.    . cetirizine (ZYRTEC) 10 MG tablet Take 10 mg by mouth daily as needed (for allergies/alternates with singulair).     . Cinacalcet HCl (SENSIPAR PO) Take by mouth.    . clonazePAM (KLONOPIN) 0.5 MG tablet Take 0.5 mg by mouth 2 (two) times daily as needed for anxiety.     . cyclobenzaprine (FLEXERIL) 5 MG tablet Take 1 tablet (5 mg total) by mouth 3 (three) times daily as needed for muscle spasms. (Patient taking differently: Take 10 mg by mouth 3 (three) times daily as needed for muscle spasms. ) 30 tablet  0  . docusate sodium (COLACE) 100 MG capsule Take 2 capsules (200 mg total) by mouth 2 (two) times daily. (Patient taking differently: Take 200 mg by mouth 2 (two) times daily as needed (for constipation). ) 10 capsule 0  . Ferric Citrate (AURYXIA PO) Take by mouth.    . fluticasone (FLONASE) 50 MCG/ACT nasal spray Place 1 spray into the nose daily.    Marland Kitchen gabapentin (NEURONTIN) 100 MG capsule Take 100 mg by mouth 2 (two) times daily.    Marland Kitchen ipratropium (ATROVENT) 0.03 % nasal spray 2 (two) times daily    .  levocetirizine (XYZAL) 5 MG tablet Take 5 mg by mouth daily.  2  . loperamide (IMODIUM) 2 MG capsule Take 2-4 mg by mouth 3 (three) times daily as needed for diarrhea or loose stools.    . montelukast (SINGULAIR) 10 MG tablet Take 10 mg by mouth daily.  1  . multivitamin (RENA-VIT) TABS tablet once daily    . pantoprazole (PROTONIX) 40 MG tablet TAKE 1 TABLET(40 MG) BY MOUTH DAILY 90 tablet 1  . phenylephrine (NEO-SYNEPHRINE) 0.5 % nasal solution Place 1 drop into both nostrils every 6 (six) hours as needed for congestion. 15 mL 1  . Spacer/Aero-Holding Chambers DEVI Use spacer with albuterol 1 each 0  . tadalafil (CIALIS) 20 MG tablet Take 1 tablet (20 mg total) by mouth daily as needed for erectile dysfunction. (Patient taking differently: Take 40 mg by mouth daily. ) 10 tablet 0   No current facility-administered medications for this visit.      REVIEW OF SYSTEMS: see HPI for pertinent positives and negatives    PHYSICAL EXAMINATION:  Vitals:   05/15/18 1039  BP: 129/84  Pulse: 90  Resp: 14  Temp: 97.6 F (36.4 C)  TempSrc: Oral  SpO2: 91%  Weight: 158 lb 6.4 oz (71.8 kg)  Height: 5\' 5"  (1.651 m)   Body mass index is 26.36 kg/m.  General: The patient appears her stated age.   HEENT:  No gross abnormalities Pulmonary: Respirations are non-labored, CTAB with good air movement in all fields. Abdomen: Soft and non-tender with normal bowel sounds. Musculoskeletal: There are no  major deformities.   Neurologic: No focal weakness or paresthesias are detected Skin: There are no ulcer or rashes noted. Psychiatric: The patient has normal affect.  Cardiovascular: There is a regular rate and rhythm with low grade murmur appreciated. Extremities: Left upper arm AVF with brisk palpable thrill and good bruit.  No signs of ischemia in feet or legs.  Feet pulses: Right: 2+ palpable DP. PT is not palpable. Left: 2+ palpable PT, DP is not palpable. 1+ non pitting edema at dorsal aspects of both forefeet.  Small flat hard lumps at medial aspect left ankle.  Several small varicosities in both lower legs.   Medical Decision Making  Katie Nunez is a 63 y.o. female who presents with intermittent pain in her ankles that occasionally wakes her at night, has this pain less during the day. She also c/o pruritus in her lower legs that seems more noticeable at night, but no rash or excoriation present.  She has small flat palpable lumps at the medial aspect of her left ankle that are unlikely to be related to her several small varicosities in both lower legs.  She has 1+ non pitting edema in both forefeet. She wears knee high compression hose at either 15-20 or 20-30 mm Hg compression. She has palpable pedal pulses with no signs of ischemia in her feet or legs.  The intermittent pain in her ankles and feet that is more pronounced at night is not likely to be related to her mild venous insufficiency. She has more than adequate arterial perfusion to her feet and legs.  She does have ESRD, and pruritis is often secondary to this. We discussed OTC Benadryl and Cortisone 10 topicals. She is already using lotion to moisturize the skin of her legs.    She also has scleroderma and gout, which could be an etiology of the small flat lumps at her left ankle. She states that she has tried  oral OTC Benadryl to little effect, and has used gabapentin with good efficacy for the pain in her legs, but  states she does not like to use the gabapentin, or to use as little as possible.   Mild to moderate venous insufficiency: continue knee high compression hose during the day, and elevation of feet above her heart whenever possible.  ESRD on hemodialysis with good functioning left upper arm AVF.  Pulmonary hypertension: managed by her PCP and Dr. Gilles Chiquito. At [facility].  Scleroderma, gout, and Raynaud's Disease: managed by her rheumatologist.   Follow up with [place] as needed.     Clemon Chambers, RN, MSN, FNP-C [facility] Office: [phone]  05/15/2018, 10:51 AM  Clinic MD: Trula Slade

## 2018-05-15 NOTE — Patient Instructions (Signed)
To decrease swelling in your feet and legs: Elevate feet above slightly bent knees, feet above heart, overnight and 3-4 times per day for 20 minutes.  To measure for knee high compression hose: Measure the length of calf (from the crease of the knee to the bottom of the heel), largest circumference of calf, and ankle circumference first thing in the morning before your legs have a chance to swell.  Take these 3 measurements with you to obtain 20-30 mm mercury graduated knee high compression hose.  Put the stockings on in the morning, remove at bedtime.     Chronic Venous Insufficiency Chronic venous insufficiency, also called venous stasis, is a condition that prevents blood from being pumped effectively through the veins in your legs. Blood may no longer be pumped effectively from the legs back to the heart. This condition can range from mild to severe. With proper treatment, you should be able to continue with an active life. What are the causes? Chronic venous insufficiency occurs when the vein walls become stretched, weakened, or damaged, or when valves within the vein are damaged. Some common causes of this include:  High blood pressure inside the veins (venous hypertension).  Increased blood pressure in the leg veins from long periods of sitting or standing.  A blood clot that blocks blood flow in a vein (deep vein thrombosis, DVT).  Inflammation of a vein (phlebitis) that causes a blood clot to form.  Tumors in the pelvis that cause blood to back up. What increases the risk? The following factors may make you more likely to develop this condition:  Having a family history of this condition.  Obesity.  Pregnancy.  Living without enough physical activity or exercise (sedentary lifestyle).  Smoking.  Having a job that requires long periods of standing or sitting in one place.  Being a certain age. Women in their 57s and 51s and men in their 52s are more likely to  develop this condition. What are the signs or symptoms? Symptoms of this condition include:  Veins that are enlarged, bulging, or twisted (varicose veins).  Skin breakdown or ulcers.  Reddened or discolored skin on the front of the leg.  Brown, smooth, tight, and painful skin just above the ankle, usually on the inside of the leg (lipodermatosclerosis).  Swelling. How is this diagnosed? This condition may be diagnosed based on:  Your medical history.  A physical exam.  Tests, such as: ? A procedure that creates an image of a blood vessel and nearby organs and provides information about blood flow through the blood vessel (duplex ultrasound). ? A procedure that tests blood flow (plethysmography). ? A procedure to look at the veins using X-ray and dye (venogram). How is this treated? The goals of treatment are to help you return to an active life and to minimize pain or disability. Treatment depends on the severity of your condition, and it may include:  Wearing compression stockings. These can help relieve symptoms and help prevent your condition from getting worse. However, they do not cure the condition.  Sclerotherapy. This is a procedure involving an injection of a material that dissolves damaged veins.  Surgery. This may involve: ? Removing a diseased vein (vein stripping). ? Cutting off blood flow through the vein (laser ablation surgery). ? Repairing a valve. Follow these instructions at home:      Wear compression stockings as told by your health care provider. These stockings help to prevent blood clots and reduce swelling in your legs.  Take over-the-counter and prescription medicines only as told by your health care provider.  Stay active by exercising, walking, or doing different activities. Ask your health care provider what activities are safe for you and how much exercise you need.  Drink enough fluid to keep your urine clear or pale yellow.  Do not use  any products that contain nicotine or tobacco, such as cigarettes and e-cigarettes. If you need help quitting, ask your health care provider.  Keep all follow-up visits as told by your health care provider. This is important. Contact a health care provider if:  You have redness, swelling, or more pain in the affected area.  You see a red streak or line that extends up or down from the affected area.  You have skin breakdown or a loss of skin in the affected area, even if the breakdown is small.  You get an injury in the affected area. Get help right away if:  You get an injury and an open wound in the affected area.  You have severe pain that does not get better with medicine.  You have sudden numbness or weakness in the foot or ankle below the affected area, or you have trouble moving your foot or ankle.  You have a fever and you have worse or persistent symptoms.  You have chest pain.  You have shortness of breath. Summary  Chronic venous insufficiency, also called venous stasis, is a condition that prevents blood from being pumped effectively through the veins in your legs.  Chronic venous insufficiency occurs when the vein walls become stretched, weakened, or damaged, or when valves within the vein are damaged.  Treatment for this condition depends on how severe your condition is, and it may involve wearing compression stockings or having a procedure.  Make sure you stay active by exercising, walking, or doing different activities. Ask your health care provider what activities are safe for you and how much exercise you need. This information is not intended to replace advice given to you by your health care provider. Make sure you discuss any questions you have with your health care provider. Document Released: 05/03/2006 Document Revised: 11/17/2015 Document Reviewed: 11/17/2015 Elsevier Interactive Patient Education  2019 [employer].

## 2018-06-22 IMAGING — DX DG CHEST 1V PORT
1 series · 1 of 1 positions shown · non-contrast
Comparison: Prior CT scan of the chest 06/13/2008

CLINICAL DATA: 60-year-old female with mid sternal throbbing chest
pain and shortness of breath

EXAM:
PORTABLE CHEST 1 VIEW

[chest ap]
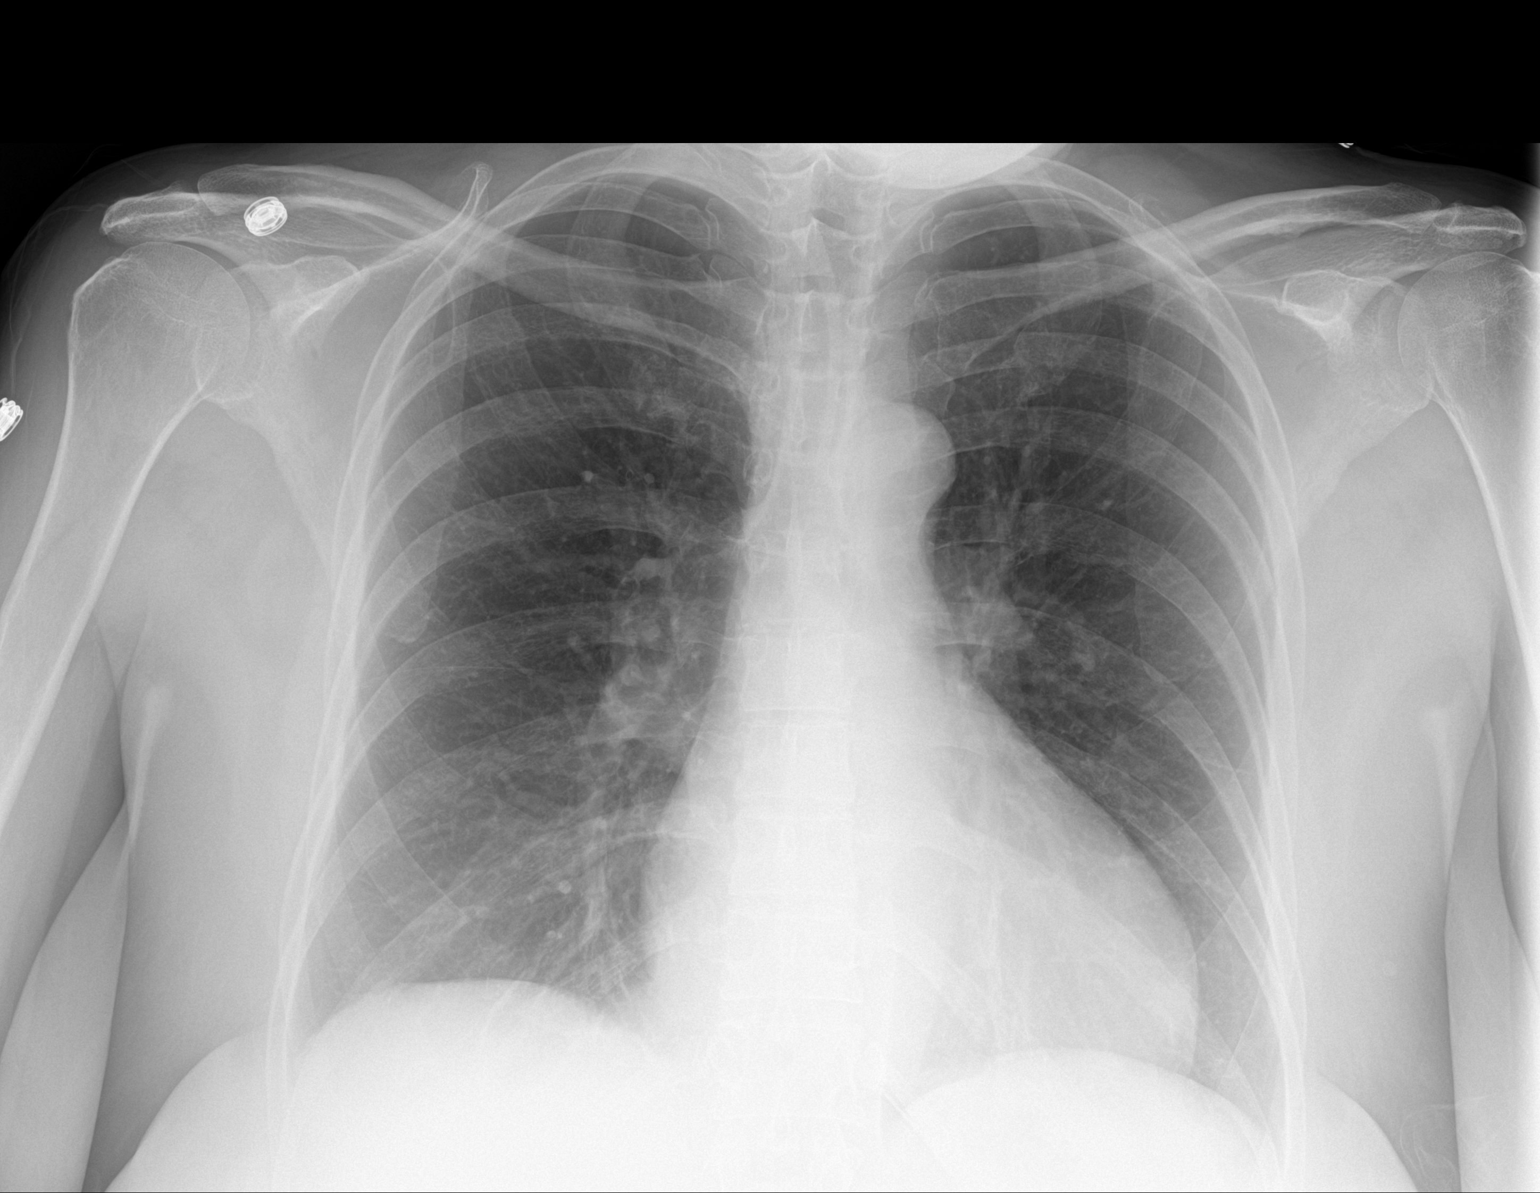

[1 of 1 positions shown; findings below may reference images not displayed]

FINDINGS: Borderline cardiomegaly. Trace atherosclerotic calcifications
present in the transverse aorta. The lungs are clear. No focal
airspace consolidation, pleural effusion, pneumothorax or pulmonary
edema. No suspicious pulmonary mass or nodule. Osseous structures
are intact and unremarkable.
IMPRESSION: 1. No acute cardiopulmonary process.
2. Borderline cardiomegaly.
3.  Aortic Atherosclerosis ([id number]-170.0)

## 2018-06-22 IMAGING — DX DG CHEST 1V PORT SAME DAY
1 series · 1 of 1 positions shown · non-contrast
Comparison: 06/28/2016 at [DATE] a.m.

CLINICAL DATA: Dialysis catheter insertion.

EXAM:
PORTABLE CHEST 1 VIEW [DATE] p.m.

[chest ap]
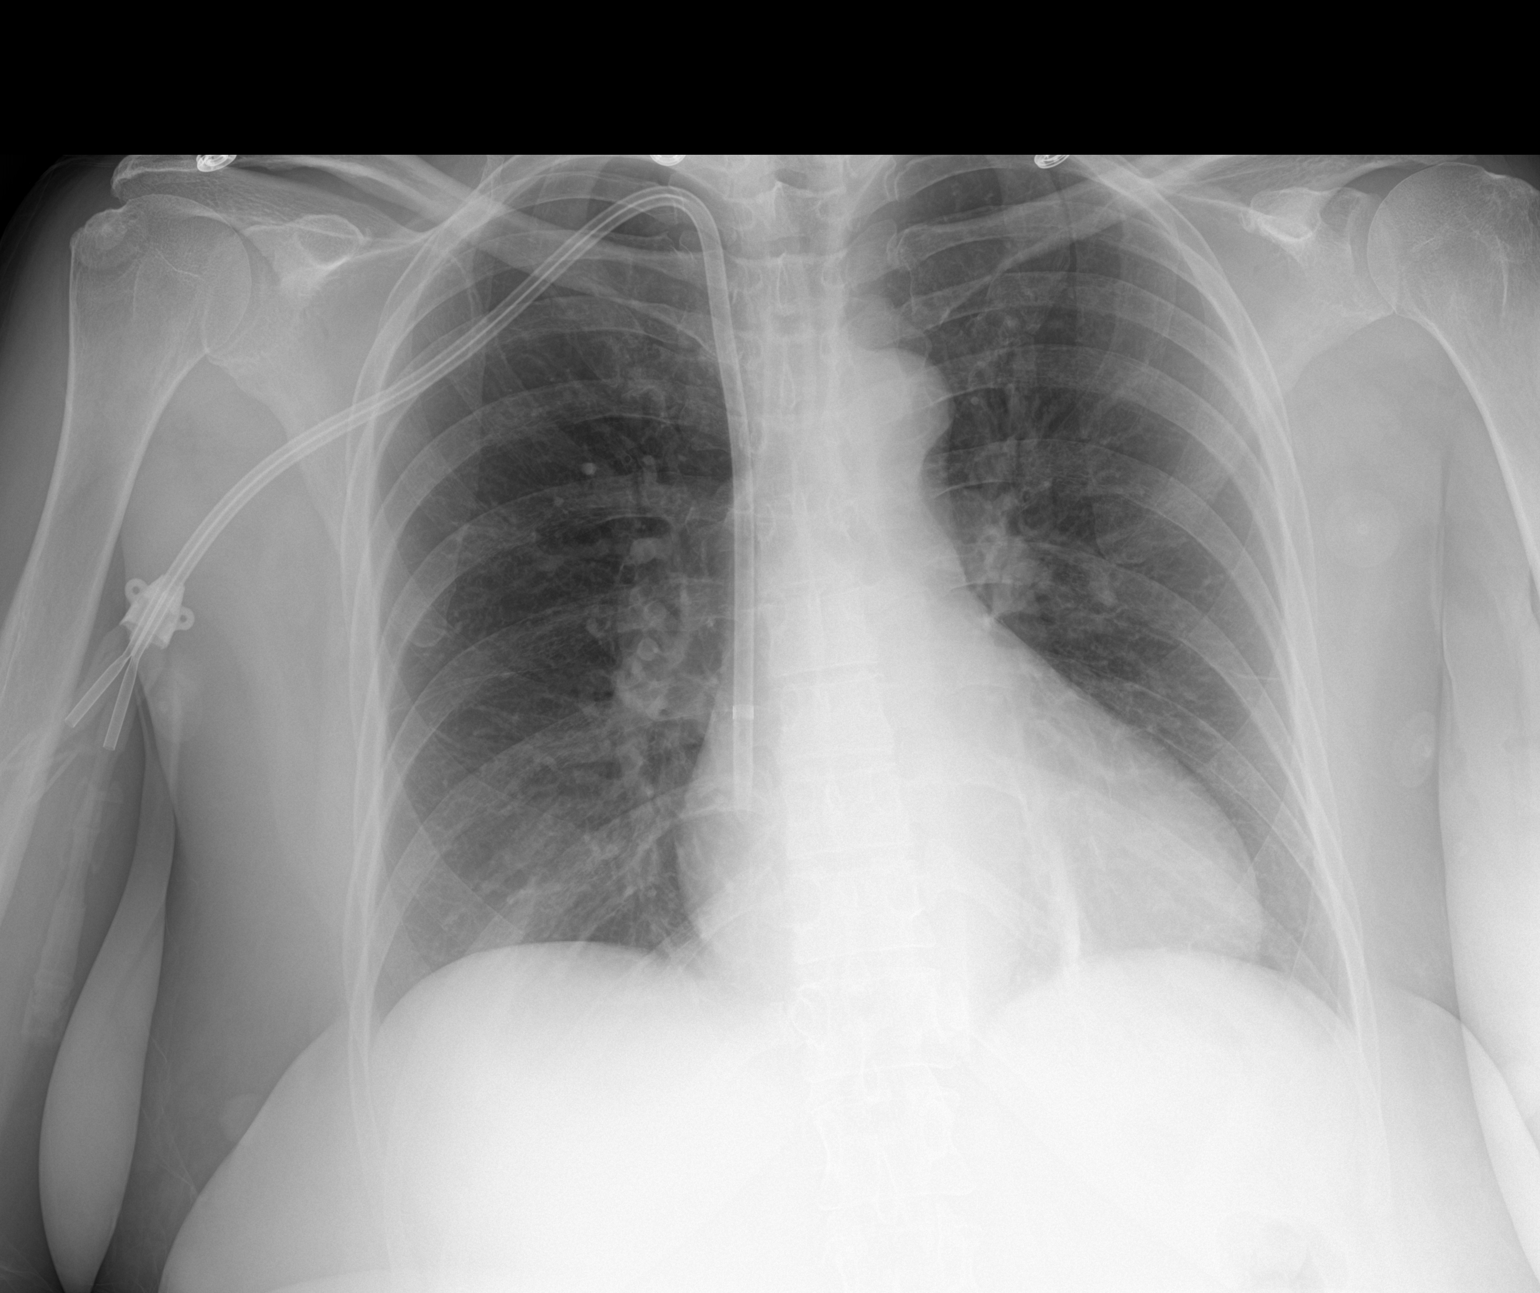

[1 of 1 positions shown; findings below may reference images not displayed]

FINDINGS: Double-lumen dialysis catheter appears in excellent position. No
pneumothorax. Heart size and vascularity are normal. Right lung is
clear. Focal area of scarring or atelectasis at the left lung base
posterior medially.
IMPRESSION: Satisfactory postoperative appearance of the chest. Slight
atelectasis or scarring at the left lung base. Catheter in good
position.

## 2018-07-12 ENCOUNTER — Encounter: Payer: Self-pay | Admitting: Physical Therapy

## 2018-07-12 ENCOUNTER — Ambulatory Visit: Payer: Medicare Other | Attending: Family Medicine | Admitting: Physical Therapy

## 2018-07-12 ENCOUNTER — Other Ambulatory Visit: Payer: Self-pay

## 2018-07-12 DIAGNOSIS — M25671 Stiffness of right ankle, not elsewhere classified: Secondary | ICD-10-CM | POA: Insufficient documentation

## 2018-07-12 DIAGNOSIS — R2681 Unsteadiness on feet: Secondary | ICD-10-CM | POA: Diagnosis present

## 2018-07-12 DIAGNOSIS — R600 Localized edema: Secondary | ICD-10-CM | POA: Insufficient documentation

## 2018-07-12 DIAGNOSIS — M25571 Pain in right ankle and joints of right foot: Secondary | ICD-10-CM | POA: Diagnosis present

## 2018-07-12 DIAGNOSIS — M25572 Pain in left ankle and joints of left foot: Secondary | ICD-10-CM | POA: Insufficient documentation

## 2018-07-12 DIAGNOSIS — M25672 Stiffness of left ankle, not elsewhere classified: Secondary | ICD-10-CM | POA: Diagnosis present

## 2018-07-12 NOTE — Therapy (Signed)
[place], [place], [address] Phone: [phone]   Fax:  [phone]  Physical Therapy Evaluation  Patient Details  Name: Katie Nunez MRN: [id number] Date of Birth: 12/15/1955 Referring Provider (PT): [name] MD   Encounter Date: 07/12/2018  PT End of Session - 07/12/18 1649    Visit Number  1    Date for PT Re-Evaluation  09/12/18    PT Start Time  1630    PT Stop Time  1710    PT Time Calculation (min)  40 min    Activity Tolerance  Patient tolerated treatment well    Behavior During Therapy  [facility] for tasks assessed/performed       Past Medical History:  Diagnosis Date  . Achalasia   . Anxiety   . Dysplasia of cervix, low grade (CIN 1)   . Environmental allergies    "all year long" (12/27/2016)  . ESRD (end stage renal disease) on dialysis [facility])    "TTS; [place]" (12/27/2016)  . Fibromyalgia   . GERD (gastroesophageal reflux disease)   . Gout   . Hypertension   . IBS (irritable bowel syndrome)   . MVP (mitral valve prolapse)   . RA (rheumatoid arthritis) (HCC)    FOLLOWED BY DR. SHANAHAN  . Raynaud's disease   . Scleroderma ([place])   . Seasonal allergies   . Thrombocytopenia ([place]) 07/01/2016   Acute fall to 13,000 07/01/16  . Tubular adenoma 01/08/2008   CECUM  . Vitamin D deficiency     Past Surgical History:  Procedure Laterality Date  . ANKLE FRACTURE SURGERY Right   . AV FISTULA PLACEMENT Left 06/28/2016   Procedure: left arm ARTERIOVENOUS (AV) FISTULA CREATION;  Surgeon: Rosetta Posner, MD;  Location: [place];  Service: Vascular;  Laterality: Left;  . BASCILIC VEIN TRANSPOSITION Left 09/27/2016   Procedure: LEFT UPPER ARM CEPHALIC VEIN TRANSPOSITION;  Surgeon: Rosetta Posner, MD;  Location: [place];  Service: Vascular;  Laterality: Left;  . BREAST BIOPSY     "? side"  . CESAREAN SECTION  1994  . CO2 LASER OF CERVIX    . COLONOSCOPY W/ BIOPSIES  01/08/2008  .  INSERTION OF DIALYSIS CATHETER Right 06/28/2016   Procedure: INSERTION OF DIALYSIS CATHETER, right internal jugular;  Surgeon: Rosetta Posner, MD;  Location: [place];  Service: Vascular;  Laterality: Right;  . MYOMECTOMY    . PELVIC LAPAROSCOPY  2011  . superficial thrombophlebitis Left 07-2014    There were no vitals filed for this visit.   Subjective Assessment - 07/12/18 1632    Subjective  Patient has scleroderma. She is having problems with her ankles which she thinks is a combination of her RA and scleroderma and possibly a side effect of one of her medications. This started about 2-3 months ago. She reports tightness and feels it across the top of her ankle and also in achilles tendons.  She reports tingling and pain intermittently.  She was seen here for the evaluation in February but did not get to continue due to the Covid issues, she does have fear due to her autoimmune issues    Pertinent History  Scleroderma, RA, pulmonary HTN, kidney disease (dialysis) Tu,TH and Sat, Rt ankle fx/dislocation/surgery 2000    Patient Stated Goals  decrease tightness to prevent falls.     Currently in Pain?  Yes    Pain Score  3     Pain  Location  Ankle    Pain Orientation  Right;Left    Pain Descriptors / Indicators  Tightness;Sharp    Pain Type  Acute pain    Pain Radiating Towards  denies    Pain Onset  More than a month ago    Pain Frequency  Intermittent    Aggravating Factors   when she swells, sitting long periods, sometimes worse at night pain can be up to 8/10    Pain Relieving Factors  gabapentin and benadryl at best pain a 2/10    Effect of Pain on Daily Activities  biggest issue is sleeping, just feel stiff and painful at times         [facility] PT Assessment - 07/12/18 0001      Assessment   Medical Diagnosis  ankle pain and scleroderma    Referring Provider (PT)  [name] MD    Onset Date/Surgical Date  12/11/17    Prior Therapy  saw PT for an eval in February prior to  Covid-19      Precautions   Precautions  None      Balance Screen   Has the patient fallen in the past 6 months  No    Has the patient had a decrease in activity level because of a fear of falling?   No    Is the patient reluctant to leave their home because of a fear of falling?   No      Home Environment   Additional Comments  has stairs,does housework, some shopping at times      Prior Function   Level of Independence  Independent    Vocation  Retired    Leisure  no exercise      AROM   Right Ankle Dorsiflexion  0    Right Ankle Plantar Flexion  40    Right Ankle Inversion  10    Right Ankle Eversion  5    Left Ankle Dorsiflexion  0    Left Ankle Plantar Flexion  40    Left Ankle Inversion  12    Left Ankle Eversion  5      PROM   Right Ankle Dorsiflexion  5    Left Ankle Dorsiflexion  5      Strength   Overall Strength Comments  ankle strength 5/5      Flexibility   Soft Tissue Assessment /Muscle Length  yes   very tight calves   Hamstrings  45 degrees SLR with c/o pain and tightness int eh legs    Quadriceps  tight    ITB  very tight    Piriformis  tight      Ambulation/Gait   Gait Comments  no device, wears compression stocking, toes turned out to limit DF, c/o tiredness and tightness in teh legs, she has shorter step length, max is 2-3 minutes                Objective measurements completed on examination: See above findings.      [facility] Adult PT Treatment/Exercise - 07/12/18 0001      Exercises   Exercises  Knee/Hip      Knee/Hip Exercises: Stretches   Gastroc Stretch  Both;3 reps;10 seconds      Knee/Hip Exercises: Aerobic   Nustep  level 3 x 5 minutes               PT Short Term Goals - 07/12/18 1717      PT SHORT  TERM GOAL #1   Title  Ind with initial HeP    Time  2    Period  Weeks    Status  New        PT Long Term Goals - 07/12/18 1718      PT LONG TERM GOAL #1   Title  Patient to report decreased pain in bil  ankles by 50% or more with ADLS.    Time  8    Period  Weeks    Status  New      PT LONG TERM GOAL #2   Title  Patient to demo improved bil ankle DF to 8 degrees actively to normalize ADLS    Time  8    Period  Weeks    Status  New      PT LONG TERM GOAL #3   Title  Improved SLS to 10 seconds bil to decrease fall risk.     Time  8    Period  Weeks    Status  New      PT LONG TERM GOAL #4   Title  tolerate walking 10 minutes    Time  8    Period  Weeks    Status  New             Plan - 07/12/18 1650    Clinical Impression Statement  Patient has history of ankle pain and tightness over the past 6 months.  She has Scleroderma, RA and kidney failure with swelling in the ankles, she is very tight in the calves and the HS, with decreased ROM of the ankles.  She reports walking tolerance is 2 minutes.    Stability/Clinical Decision Making  Evolving/Moderate complexity    Clinical Decision Making  Moderate    Rehab Potential  Good    PT Frequency  2x / week    PT Duration  8 weeks    PT Treatment/Interventions  ADLs/Self Care Home Management;Cryotherapy;Electrical Stimulation;Ultrasound;Therapeutic exercise;Balance training;Patient/family education;Manual techniques;Passive range of motion;Dry needling;Therapeutic activities;Neuromuscular re-education    PT Next Visit Plan  ROM/flexibility bil ankles, balance activities; retrograde massage/manual to lower legs; modalities prn    PT Home Exercise Plan  ankle ROM, gastroc/soleus stretch    Consulted and Agree with Plan of Care  Patient       Patient will benefit from skilled therapeutic intervention in order to improve the following deficits and impairments:  Pain, Postural dysfunction, Decreased range of motion, Decreased balance, Increased edema, Impaired flexibility, Abnormal gait, Decreased mobility, Difficulty walking  Visit Diagnosis: 1. Localized edema   2. Unsteadiness on feet   3. Stiffness of left ankle, not  elsewhere classified   4. Stiffness of right ankle, not elsewhere classified        Problem List Patient Active Problem List   Diagnosis Date Noted  . Encounter for long-term (current) use of other medications 01/18/2018  . Hypothyroidism 01/18/2018  . Myalgia and myositis 01/18/2018  . Chronic nephritis 01/18/2018  . Other interstitial pulmonary diseases with fibrosis in diseases classified elsewhere ([place]) 01/18/2018  . Other long term (current) drug therapy 01/18/2018  . Other specified diabetes mellitus without complications ([place]) 26/33/3545  . Sleep apnea 01/18/2018  . Unspecified persistent mental disorders due to conditions classified elsewhere 01/18/2018  . Venous reflux 01/18/2018  . Vitamin B12 deficiency 01/18/2018  . Vitamin D deficiency 01/18/2018  . Obstructive lung disease ([place]) 12/30/2017  . 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 ([place]) 11/28/2017  .  Chronic cough 11/11/2017  . Pulmonary hypertension ([place]) 11/11/2017  . Pulmonary arterial hypertension ([place]) 10/07/2017  . Acute ITP ([place]) 01/05/2017  . ESRD (end stage renal disease) ([place]) 12/28/2016  . Hemolytic anemia ([place]) 07/26/2016  . Epistaxis, recurrent 07/26/2016  . Thrombocytopenia ([place]) 07/01/2016  . ARF (acute renal failure) ([place]) 06/25/2016  . Anxiety 06/25/2016  . Bilateral carpal tunnel syndrome 10/22/2015  . Chronic gout without tophus 10/22/2015  . Chronic nonintractable headache 10/08/2015  . Fibroid uterus 01/03/2012  . H/O vitamin D deficiency 01/03/2012  . Post-menopausal 01/03/2012  . Hereditary and idiopathic peripheral neuropathy 11/19/2011  . Intestinal malabsorption 11/19/2011  . Lichen planus 73/71/0626  . Low back pain 11/19/2011  . Diffuse spasm of esophagus 11/11/2011  . ESR raised 11/11/2011  . Postinflammatory pulmonary fibrosis ([place]) 11/11/2011  . Scleroderma ([place]) 11/16/2010  . Rheumatoid arthritis ([place]) 11/16/2010  . Raynaud's disease  11/16/2010  . Symptomatic menopausal or female climacteric states 11/16/2010    Sumner Boast., PT 07/12/2018, 5:20 PM  [place], [place], [address] Phone: [phone]   Fax:  [phone]  Name: SAYDIE GERDTS MRN: [id number] Date of Birth: [birth year]

## 2018-07-19 ENCOUNTER — Ambulatory Visit: Payer: Medicare Other | Admitting: Physical Therapy

## 2018-07-24 ENCOUNTER — Encounter: Payer: Self-pay | Admitting: Physical Therapy

## 2018-07-26 ENCOUNTER — Other Ambulatory Visit: Payer: Self-pay

## 2018-07-26 ENCOUNTER — Ambulatory Visit: Payer: Medicare Other | Admitting: Physical Therapy

## 2018-07-26 ENCOUNTER — Encounter: Payer: Self-pay | Admitting: Physical Therapy

## 2018-07-26 DIAGNOSIS — M25672 Stiffness of left ankle, not elsewhere classified: Secondary | ICD-10-CM

## 2018-07-26 DIAGNOSIS — R2681 Unsteadiness on feet: Secondary | ICD-10-CM

## 2018-07-26 DIAGNOSIS — R600 Localized edema: Secondary | ICD-10-CM | POA: Diagnosis not present

## 2018-07-26 DIAGNOSIS — M25571 Pain in right ankle and joints of right foot: Secondary | ICD-10-CM

## 2018-07-26 DIAGNOSIS — M25572 Pain in left ankle and joints of left foot: Secondary | ICD-10-CM

## 2018-07-26 DIAGNOSIS — M25671 Stiffness of right ankle, not elsewhere classified: Secondary | ICD-10-CM

## 2018-07-26 NOTE — Therapy (Signed)
[place] [address] [place], [place], [address] Phone: [phone]   Fax:  [phone]  Physical Therapy Treatment  Patient Details  Name: Katie Nunez MRN: [id number] Date of Birth: January 01, 1956 Referring Provider (PT): [name] MD   Encounter Date: 07/26/2018  PT End of Session - 07/26/18 1800    Visit Number  2    Date for PT Re-Evaluation  09/12/18    PT Start Time  1627   patient 12 minutes late   PT Stop Time  1700    PT Time Calculation (min)  33 min    Activity Tolerance  Patient tolerated treatment well    Behavior During Therapy  [facility] for tasks assessed/performed       Past Medical History:  Diagnosis Date  . Achalasia   . Anxiety   . Dysplasia of cervix, low grade (CIN 1)   . Environmental allergies    "all year long" (12/27/2016)  . ESRD (end stage renal disease) on dialysis [facility])    "TTS; [place]" (12/27/2016)  . Fibromyalgia   . GERD (gastroesophageal reflux disease)   . Gout   . Hypertension   . IBS (irritable bowel syndrome)   . MVP (mitral valve prolapse)   . RA (rheumatoid arthritis) (HCC)    FOLLOWED BY DR. SHANAHAN  . Raynaud's disease   . Scleroderma ([place])   . Seasonal allergies   . Thrombocytopenia ([place]) 07/01/2016   Acute fall to 13,000 07/01/16  . Tubular adenoma 01/08/2008   CECUM  . Vitamin D deficiency     Past Surgical History:  Procedure Laterality Date  . ANKLE FRACTURE SURGERY Right   . AV FISTULA PLACEMENT Left 06/28/2016   Procedure: left arm ARTERIOVENOUS (AV) FISTULA CREATION;  Surgeon: Rosetta Posner, MD;  Location: [place];  Service: Vascular;  Laterality: Left;  . BASCILIC VEIN TRANSPOSITION Left 09/27/2016   Procedure: LEFT UPPER ARM CEPHALIC VEIN TRANSPOSITION;  Surgeon: Rosetta Posner, MD;  Location: [place];  Service: Vascular;  Laterality: Left;  . BREAST BIOPSY     "? side"  . CESAREAN SECTION  1994  . CO2 LASER OF CERVIX    . COLONOSCOPY W/  BIOPSIES  01/08/2008  . INSERTION OF DIALYSIS CATHETER Right 06/28/2016   Procedure: INSERTION OF DIALYSIS CATHETER, right internal jugular;  Surgeon: Rosetta Posner, MD;  Location: [place];  Service: Vascular;  Laterality: Right;  . MYOMECTOMY    . PELVIC LAPAROSCOPY  2011  . superficial thrombophlebitis Left 07-2014    There were no vitals filed for this visit.  Subjective Assessment - 07/26/18 1758    Subjective  Patient was at [place] all day today, she reports very tired, does not want to do much as she did a 6 minute walk test and other tests    Currently in Pain?  No/denies                       [facility] Adult PT Treatment/Exercise - 07/26/18 0001      Modalities   Modalities  Ultrasound      Ultrasound   Ultrasound Location  ankles anterior    Ultrasound Parameters  100% 3MHz 1.3 w/cm2    Ultrasound Goals  Pain;Other (Comment)   mobility     Manual Therapy   Manual Therapy  Joint mobilization;Passive ROM    Joint Mobilization  both ankles and mid foot    Passive ROM  ankles and toes               PT Short Term Goals - 07/12/18 1717      PT SHORT TERM GOAL #1   Title  Ind with initial HeP    Time  2    Period  Weeks    Status  New        PT Long Term Goals - 07/12/18 1718      PT LONG TERM GOAL #1   Title  Patient to report decreased pain in bil ankles by 50% or more with ADLS.    Time  8    Period  Weeks    Status  New      PT LONG TERM GOAL #2   Title  Patient to demo improved bil ankle DF to 8 degrees actively to normalize ADLS    Time  8    Period  Weeks    Status  New      PT LONG TERM GOAL #3   Title  Improved SLS to 10 seconds bil to decrease fall risk.     Time  8    Period  Weeks    Status  New      PT LONG TERM GOAL #4   Title  tolerate walking 10 minutes    Time  8    Period  Weeks    Status  New            Plan - 07/26/18 1801    Clinical Impression Statement  Pateint very tired today due to her MD  appointments at [facility], she is very tight so we did some [place] and PROM and joint mobs    PT Next Visit Plan  ROM/flexibility bil ankles, balance activities; retrograde massage/manual to lower legs; modalities prn    Consulted and Agree with Plan of Care  Patient       Patient will benefit from skilled therapeutic intervention in order to improve the following deficits and impairments:  Pain, Postural dysfunction, Decreased range of motion, Decreased balance, Increased edema, Impaired flexibility, Abnormal gait, Decreased mobility, Difficulty walking  Visit Diagnosis: 1. Localized edema   2. Unsteadiness on feet   3. Stiffness of left ankle, not elsewhere classified   4. Stiffness of right ankle, not elsewhere classified   5. Pain in left ankle and joints of left foot   6. Pain in right ankle and joints of right foot        Problem List Patient Active Problem List   Diagnosis Date Noted  . Encounter for long-term (current) use of other medications 01/18/2018  . Hypothyroidism 01/18/2018  . Myalgia and myositis 01/18/2018  . Chronic nephritis 01/18/2018  . Other interstitial pulmonary diseases with fibrosis in diseases classified elsewhere ([place]) 01/18/2018  . Other long term (current) drug therapy 01/18/2018  . Other specified diabetes mellitus without complications ([place]) 78/93/8101  . Sleep apnea 01/18/2018  . Unspecified persistent mental disorders due to conditions classified elsewhere 01/18/2018  . Venous reflux 01/18/2018  . Vitamin B12 deficiency 01/18/2018  . Vitamin D deficiency 01/18/2018  . Obstructive lung disease ([place]) 12/30/2017  . 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 Chronic cough 11/11/2017  . Pulmonary hypertension ([place]) 11/11/2017  . Pulmonary arterial hypertension ([place]) 10/07/2017  . Acute ITP ([place]) 01/05/2017  . ESRD (end stage renal disease) ([place]) 12/28/2016  . Hemolytic anemia ([place]) 07/26/2016  .  Epistaxis, recurrent 07/26/2016  .  Thrombocytopenia ([place]) 07/01/2016  . ARF (acute renal failure) ([place]) 06/25/2016  . Anxiety 06/25/2016  . Bilateral carpal tunnel syndrome 10/22/2015  . Chronic gout without tophus 10/22/2015  . Chronic nonintractable headache 10/08/2015  . Fibroid uterus 01/03/2012  . H/O vitamin D deficiency 01/03/2012  . Post-menopausal 01/03/2012  . Hereditary and idiopathic peripheral neuropathy 11/19/2011  . Intestinal malabsorption 11/19/2011  . Lichen planus 51/10/2109  . Low back pain 11/19/2011  . Diffuse spasm of esophagus 11/11/2011  . ESR raised 11/11/2011  . Postinflammatory pulmonary fibrosis ([place]) 11/11/2011  . Scleroderma ([place]) 11/16/2010  . Rheumatoid arthritis ([place]) 11/16/2010  . Raynaud's disease 11/16/2010  . Symptomatic menopausal or female climacteric states 11/16/2010    Sumner Boast., PT 07/26/2018, 6:02 PM  [place] [address] [place], [place], [address] Phone: [phone]   Fax:  [phone]  Name: Katie Nunez MRN: [id number] Date of Birth: 04/29/1955

## 2018-07-27 ENCOUNTER — Emergency Department (HOSPITAL_COMMUNITY): Payer: Medicare Other

## 2018-07-27 ENCOUNTER — Encounter (HOSPITAL_COMMUNITY): Payer: Self-pay | Admitting: Emergency Medicine

## 2018-07-27 ENCOUNTER — Emergency Department (HOSPITAL_COMMUNITY)
Admission: EM | Admit: 2018-07-27 | Discharge: 2018-07-27 | Disposition: A | Discharge status: Home / Self Care | Payer: Medicare Other | Attending: Emergency Medicine | Admitting: Emergency Medicine

## 2018-07-27 DIAGNOSIS — I120 Hypertensive chronic kidney disease with stage 5 chronic kidney disease or end stage renal disease: Secondary | ICD-10-CM | POA: Insufficient documentation

## 2018-07-27 DIAGNOSIS — E039 Hypothyroidism, unspecified: Secondary | ICD-10-CM | POA: Diagnosis not present

## 2018-07-27 DIAGNOSIS — Z79899 Other long term (current) drug therapy: Secondary | ICD-10-CM | POA: Diagnosis not present

## 2018-07-27 DIAGNOSIS — Z992 Dependence on renal dialysis: Secondary | ICD-10-CM | POA: Insufficient documentation

## 2018-07-27 DIAGNOSIS — R0981 Nasal congestion: Secondary | ICD-10-CM | POA: Diagnosis not present

## 2018-07-27 DIAGNOSIS — R609 Edema, unspecified: Secondary | ICD-10-CM | POA: Diagnosis not present

## 2018-07-27 DIAGNOSIS — R000 Tachycardia, unspecified: Secondary | ICD-10-CM | POA: Diagnosis present

## 2018-07-27 DIAGNOSIS — N186 End stage renal disease: Secondary | ICD-10-CM | POA: Diagnosis not present

## 2018-07-27 DIAGNOSIS — E8779 Other fluid overload: Secondary | ICD-10-CM | POA: Insufficient documentation

## 2018-07-27 DIAGNOSIS — I498 Other specified cardiac arrhythmias: Secondary | ICD-10-CM | POA: Insufficient documentation

## 2018-07-27 LAB — CBC WITH DIFFERENTIAL/PLATELET
Abs Immature Granulocytes: 0.01 10*3/uL (ref 0.00–0.07)
Basophils Absolute: 0 10*3/uL (ref 0.0–0.1)
Basophils Relative: 1 %
Eosinophils Absolute: 0.1 10*3/uL (ref 0.0–0.5)
Eosinophils Relative: 3 %
HCT: 31 % — ABNORMAL LOW (ref 36.0–46.0)
Hemoglobin: 9.8 g/dL — ABNORMAL LOW (ref 12.0–15.0)
Immature Granulocytes: 0 %
Lymphocytes Relative: 20 %
Lymphs Abs: 0.7 10*3/uL (ref 0.7–4.0)
MCH: 29.7 pg (ref 26.0–34.0)
MCHC: 31.6 g/dL (ref 30.0–36.0)
MCV: 93.9 fL (ref 80.0–100.0)
Monocytes Absolute: 0.4 10*3/uL (ref 0.1–1.0)
Monocytes Relative: 12 %
Neutro Abs: 2.2 10*3/uL (ref 1.7–7.7)
Neutrophils Relative %: 64 %
Platelets: 140 10*3/uL — ABNORMAL LOW (ref 150–400)
RBC: 3.3 MIL/uL — ABNORMAL LOW (ref 3.87–5.11)
RDW: 15.7 % — ABNORMAL HIGH (ref 11.5–15.5)
WBC: 3.5 10*3/uL — ABNORMAL LOW (ref 4.0–10.5)
nRBC: 0 % (ref 0.0–0.2)

## 2018-07-27 LAB — COMPREHENSIVE METABOLIC PANEL
ALT: 14 U/L (ref 0–44)
AST: 24 U/L (ref 15–41)
Albumin: 2.8 g/dL — ABNORMAL LOW (ref 3.5–5.0)
Alkaline Phosphatase: 109 U/L (ref 38–126)
Anion gap: 13 (ref 5–15)
BUN: 33 mg/dL — ABNORMAL HIGH (ref 6–20)
CO2: 23 mmol/L (ref 22–32)
Calcium: 7.6 mg/dL — ABNORMAL LOW (ref 8.9–10.3)
Chloride: 105 mmol/L (ref 98–111)
Creatinine, Ser: 7.36 mg/dL — ABNORMAL HIGH (ref 0.44–1.00)
GFR calc Af Amer: 6 mL/min — ABNORMAL LOW (ref >60)
GFR calc non Af Amer: 5 mL/min — ABNORMAL LOW (ref >60)
Glucose, Bld: 82 mg/dL (ref 70–99)
Potassium: 3.2 mmol/L — ABNORMAL LOW (ref 3.5–5.1)
Sodium: 141 mmol/L (ref 135–145)
Total Bilirubin: 0.9 mg/dL (ref 0.3–1.2)
Total Protein: 6.1 g/dL — ABNORMAL LOW (ref 6.5–8.1)

## 2018-07-27 MED ORDER — APIXABAN 5 MG PO TABS: 5.0000 mg | ORAL_TABLET | Freq: Two times a day (BID) | ORAL | 0 refills | Status: DC

## 2018-07-27 MED ORDER — APIXABAN 5 MG PO TABS
5.0000 mg | ORAL_TABLET | Freq: Two times a day (BID) | ORAL | Status: DC
  Administered 2018-07-27: 5 mg via ORAL
  Filled 2018-07-27 (×2): qty 1

## 2018-07-27 NOTE — Discharge Instructions (Signed)
I spoke with your cardiologist at [facility], they have recommended that we start a blood thinner called Eliquis to prevent blood clots in your heart.  We have given you the first dose but you will need to take the second dose tonight and take it twice a day after that.  This may make it more easy for you to bleed, please let the people at dialysis know.  You will need to be dialyzed either tomorrow or the next day, if you are developing increasing shortness of breath or weakness please return to the emergency department immediately.  The cardiology team will contact you for follow-up at [facility].

## 2018-07-27 NOTE — ED Triage Notes (Signed)
Pt to ER from dialysis for evaluation of tachycardia at 140+ in atrial flutter. Patient currently being followed by [facility] for pulmonary hypertension and arrythmias, she has a holter monitor on currently and was seen yesterday in the office and had an ECHO completed. She is scheduled for right heart cath. She is in NAD distress today, oxygen dependent on 3L. Other vitals are stable, while in route with EMS her rate decreased to 120 atrial tach.

## 2018-07-27 NOTE — ED Notes (Signed)
Called [facility] for cards consult per Dr. Sabra Heck

## 2018-07-27 NOTE — ED Provider Notes (Signed)
[facility] EMERGENCY DEPARTMENT Provider Note   CSN: [id number] Arrival date & time: 07/27/18  9562    History   Chief Complaint Chief Complaint  Patient presents with  . Tachycardia    HPI Katie Nunez is a 63 y.o. female.     HPI  The patient is a 63 year old female, she has a known history of end-stage renal disease on dialysis for the last 2 years, she is followed by the cardiologist at [facility] for pulmonary artery hypertension and arrhythmias, Dr. Gilles Chiquito, and states that she was actually at [facility] yesterday where she had a repeat echocardiogram, treadmill test and had been doing very well.  She has some type of arrhythmias that come and go for which they are trying to get them figured out but at this point are unclear on the exact etiology.  When she went to dialysis today she was noted to be tachycardic in the 145 bpm, atrial flutter was the likely cause based on the paramedics report, she was upset and angry that she had to leave dialysis stating that it is not unusual for her to have some tachycardia.  She denies any other symptoms other than some chronic postnasal drip with occasional cough but does not feel short of breath, febrile, nauseated and has not had any diarrhea, blood in her stools or significant swelling of her legs.  The patient reports that at this time she is feeling just fine without any palpitations, noting that her heart rate is 120 at this time.  Past Medical History:  Diagnosis Date  . Achalasia   . Anxiety   . Dysplasia of cervix, low grade (CIN 1)   . Environmental allergies    "all year long" (12/27/2016)  . ESRD (end stage renal disease) on dialysis [facility])    "TTS; [facility]" (12/27/2016)  . Fibromyalgia   . GERD (gastroesophageal reflux disease)   . Gout   . Hypertension   . IBS (irritable bowel syndrome)   . MVP (mitral valve prolapse)   . RA (rheumatoid arthritis) (HCC)    FOLLOWED BY DR. SHANAHAN  .  Raynaud's disease   . Scleroderma ([place])   . Seasonal allergies   . Thrombocytopenia ([place]) 07/01/2016   Acute fall to 13,000 07/01/16  . Tubular adenoma 01/08/2008   CECUM  . Vitamin D deficiency     Patient Active Problem List   Diagnosis Date Noted  . Encounter for long-term (current) use of other medications 01/18/2018  . Hypothyroidism 01/18/2018  . Myalgia and myositis 01/18/2018  . Chronic nephritis 01/18/2018  . Other interstitial pulmonary diseases with fibrosis in diseases classified elsewhere ([place]) 01/18/2018  . Other long term (current) drug therapy 01/18/2018  . Other specified diabetes mellitus without complications ([place]) 13/08/6576  . Sleep apnea 01/18/2018  . Unspecified persistent mental disorders due to conditions classified elsewhere 01/18/2018  . Venous reflux 01/18/2018  . Vitamin B12 deficiency 01/18/2018  . Vitamin D deficiency 01/18/2018  . Obstructive lung disease ([place]) 12/30/2017  . 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 Chronic cough 11/11/2017  . Pulmonary hypertension ([place]) 11/11/2017  . Pulmonary arterial hypertension ([place]) 10/07/2017  . Acute ITP ([place]) 01/05/2017  . ESRD (end stage renal disease) ([place]) 12/28/2016  . Hemolytic anemia ([place]) 07/26/2016  . Epistaxis, recurrent 07/26/2016  . Thrombocytopenia ([place]) 07/01/2016  . ARF (acute renal failure) ([place]) 06/25/2016  . Anxiety 06/25/2016  . Bilateral carpal tunnel syndrome 10/22/2015  .  Chronic gout without tophus 10/22/2015  . Chronic nonintractable headache 10/08/2015  . Fibroid uterus 01/03/2012  . H/O vitamin D deficiency 01/03/2012  . Post-menopausal 01/03/2012  . Hereditary and idiopathic peripheral neuropathy 11/19/2011  . Intestinal malabsorption 11/19/2011  . Lichen planus 85/46/2703  . Low back pain 11/19/2011  . Diffuse spasm of esophagus 11/11/2011  . ESR raised 11/11/2011  . Postinflammatory pulmonary fibrosis ([place]) 11/11/2011   . Scleroderma ([place]) 11/16/2010  . Rheumatoid arthritis ([place]) 11/16/2010  . Raynaud's disease 11/16/2010  . Symptomatic menopausal or female climacteric states 11/16/2010    Past Surgical History:  Procedure Laterality Date  . ANKLE FRACTURE SURGERY Right   . AV FISTULA PLACEMENT Left 06/28/2016   Procedure: left arm ARTERIOVENOUS (AV) FISTULA CREATION;  Surgeon: Rosetta Posner, MD;  Location: [facility];  Service: Vascular;  Laterality: Left;  . BASCILIC VEIN TRANSPOSITION Left 09/27/2016   Procedure: LEFT UPPER ARM CEPHALIC VEIN TRANSPOSITION;  Surgeon: Rosetta Posner, MD;  Location: [facility];  Service: Vascular;  Laterality: Left;  . BREAST BIOPSY     "? side"  . CESAREAN SECTION  1994  . CO2 LASER OF CERVIX    . COLONOSCOPY W/ BIOPSIES  01/08/2008  . INSERTION OF DIALYSIS CATHETER Right 06/28/2016   Procedure: INSERTION OF DIALYSIS CATHETER, right internal jugular;  Surgeon: Rosetta Posner, MD;  Location: [facility];  Service: Vascular;  Laterality: Right;  . MYOMECTOMY    . PELVIC LAPAROSCOPY  2011  . superficial thrombophlebitis Left 07-2014     OB History    Gravida  1   Para  1   Term  1   Preterm      AB      Living  1     SAB      TAB      Ectopic      Multiple      Live Births  1            Home Medications    Prior to Admission medications   Medication Sig Start Date End Date Taking? Authorizing Provider  acetaminophen (TYLENOL) 500 MG tablet Take 500-1,000 mg by mouth every 6 (six) hours as needed (for pain/headaches.).    Yes [provider]  albuterol (PROAIR HFA) 108 (90 Base) MCG/ACT inhaler Inhale 2 puffs into the lungs every 6 (six) hours as needed for wheezing or shortness of breath.  10/07/17  Yes [provider]  ambrisentan (LETAIRIS) 5 MG tablet Take 5 mg by mouth at bedtime.  01/12/18 01/12/19 Yes [provider]  azelastine (ASTELIN) 0.1 % nasal spray Place 1 spray into both nostrils daily. 12/24/16  Yes [provider]  cetirizine (ZYRTEC) 10 MG tablet Take 10 mg by mouth daily as needed (for allergies/alternates with singulair).    Yes [provider]  cinacalcet (SENSIPAR) 30 MG tablet Take 30 mg by mouth at bedtime.    Yes [provider]  ciprofloxacin (CIPRO) 500 MG tablet Take 500 mg by mouth 2 (two) times a day. 07/19/18  Yes [provider]  clonazePAM (KLONOPIN) 0.5 MG tablet Take 0.5 mg by mouth 2 (two) times daily as needed for anxiety.    Yes [provider]  cyclobenzaprine (FLEXERIL) 5 MG tablet Take 1 tablet (5 mg total) by mouth 3 (three) times daily as needed for muscle spasms. Patient taking differently: Take 10 mg by mouth 3 (three) times daily as needed for muscle spasms.  07/07/16  Yes Doyle Askew,  Angeline Slim, MD  docusate sodium (COLACE) 100 MG capsule Take 2 capsules (200 mg total) by mouth 2 (two) times daily. Patient taking differently: Take 200 mg by mouth 2 (two) times daily as needed (for constipation).  07/07/16  Yes Theodis Blaze, MD  ferric citrate (AURYXIA) 1 GM 210 MG(Fe) tablet Take 420 mg by mouth 3 (three) times daily with meals.    Yes [provider]  fluticasone (FLONASE) 50 MCG/ACT nasal spray Place 1 spray into the nose daily. 12/15/09  Yes [provider]  gabapentin (NEURONTIN) 100 MG capsule Take 100 mg by mouth daily as needed (pain).    Yes [provider]  loperamide (IMODIUM) 2 MG capsule Take 2-4 mg by mouth 3 (three) times daily as needed for diarrhea or loose stools.   Yes [provider]  montelukast (SINGULAIR) 10 MG tablet Take 10 mg by mouth daily as needed (allergy symptoms).  08/27/16  Yes [provider]  multivitamin (RENA-VIT) TABS tablet Take 1 tablet by mouth daily.  03/03/17  Yes [provider]  pantoprazole (PROTONIX) 40 MG tablet TAKE 1 TABLET(40 MG) BY MOUTH DAILY 11/11/17  Yes Byrum, Rose Fillers, MD  tadalafil (CIALIS) 20 MG tablet Take 1 tablet (20 mg total) by mouth daily  as needed for erectile dysfunction. Patient taking differently: Take 40 mg by mouth at bedtime.  01/11/18  Yes Annia Belt, MD  apixaban (ELIQUIS) 5 MG TABS tablet Take 1 tablet (5 mg total) by mouth 2 (two) times daily. 07/27/18 08/26/18  Noemi Chapel, MD  ipratropium (ATROVENT) 0.03 % nasal spray 2 (two) times daily 07/05/17   [provider]  phenylephrine (NEO-SYNEPHRINE) 0.5 % nasal solution Place 1 drop into both nostrils every 6 (six) hours as needed for congestion. 07/07/16   Theodis Blaze, MD  Spacer/Aero-Holding Josiah Lobo DEVI Use spacer with albuterol 11/11/17   Collene Gobble, MD    Family History Family History  Problem Relation Age of Onset  . Hypertension Mother   . Diabetes Mother   . Heart disease Father   . Hypertension Maternal Aunt   . Diabetes Maternal Grandmother   . Heart disease Paternal Grandfather   . Cerebral palsy Cousin        1ST COUSIN?  . Diabetes Paternal Grandmother     Social History Social History   Tobacco Use  . Smoking status: Never Smoker  . Smokeless tobacco: Never Used  Substance Use Topics  . Alcohol use: No  . Drug use: No     Allergies   Savella [milnacipran hcl] and Tape   Review of Systems Review of Systems  All other systems reviewed and are negative.    Physical Exam Updated Vital Signs BP 135/86   Pulse 93   Temp 98.7 F (37.1 C) (Oral)   Resp 12   LMP 11/15/2008   SpO2 99%   Physical Exam Vitals signs and nursing note reviewed.  Constitutional:      General: She is not in acute distress.    Appearance: She is well-developed.  HENT:     Head: Normocephalic and atraumatic.     Mouth/Throat:     Pharynx: No oropharyngeal exudate.  Eyes:     General: No scleral icterus.       Right eye: No discharge.        Left eye: No discharge.     Conjunctiva/sclera: Conjunctivae normal.     Pupils: Pupils are equal, round, and reactive to light.  Neck:     Musculoskeletal: Normal range of motion and  neck supple.     Thyroid: No thyromegaly.     Vascular: No JVD.  Cardiovascular:     Rate and Rhythm: Regular rhythm. Tachycardia present.     Heart sounds: Normal heart sounds. No murmur. No friction rub. No gallop.   Pulmonary:     Effort: Pulmonary effort is normal. No respiratory distress.     Breath sounds: Rales present. No wheezing.  Abdominal:     General: Bowel sounds are normal. There is no distension.     Palpations: Abdomen is soft. There is no mass.     Tenderness: There is no abdominal tenderness.  Musculoskeletal: Normal range of motion.        General: No tenderness.     Right lower leg: Edema present.     Left lower leg: Edema present.  Lymphadenopathy:     Cervical: No cervical adenopathy.  Skin:    General: Skin is warm and dry.     Findings: No erythema or rash.  Neurological:     Mental Status: She is alert.     Coordination: Coordination normal.  Psychiatric:        Behavior: Behavior normal.      ED Treatments / Results  Labs (all labs ordered are listed, but only abnormal results are displayed) Labs Reviewed  CBC WITH DIFFERENTIAL/PLATELET - Abnormal; Notable for the following components:      Result Value   WBC 3.5 (*)    RBC 3.30 (*)    Hemoglobin 9.8 (*)    HCT 31.0 (*)    RDW 15.7 (*)    Platelets 140 (*)    All other components within normal limits  COMPREHENSIVE METABOLIC PANEL - Abnormal; Notable for the following components:   Potassium 3.2 (*)    BUN 33 (*)    Creatinine, Ser 7.36 (*)    Calcium 7.6 (*)    Total Protein 6.1 (*)    Albumin 2.8 (*)    GFR calc non Af Amer 5 (*)    GFR calc Af Amer 6 (*)    All other components within normal limits    EKG EKG Interpretation  Date/Time:  Thursday July 27 2018 09:44:08 EDT Ventricular Rate:  117 PR Interval:    QRS Duration: 104 QT Interval:  324 QTC Calculation: 452 R Axis:     Text Interpretation:  Sinus tachycardia Low voltage limb leads since last tracing, t wave  abnormalities seen Confirmed by Noemi Chapel [phone]) on 07/27/2018 9:48:32 AM   Radiology Dg Chest 2 View  Result Date: 07/27/2018 CLINICAL DATA:  Cough and shortness of breath. EXAM: CHEST - 2 VIEW COMPARISON:  June 28, 2016 cough and shortness of breath. FINDINGS: The cardiac silhouette is enlarged. Mildly increased interstitial markings/peribronchial thickening with lower lobe predominance. Osseous structures are without acute abnormality. Soft tissues are grossly normal. IMPRESSION: 1. Enlarged cardiac silhouette. 2. Mildly increased interstitial markings/peribronchial thickening with lower lobe predominance. This may represent mild interstitial pulmonary edema and/or atelectatic changes. Electronically Signed   By: Fidela Salisbury M.D.   On: 07/27/2018 10:14    Procedures Procedures (including critical care time)  Medications Ordered in ED Medications  apixaban (ELIQUIS) tablet 5 mg (has no administration in time range)     Initial Impression / Assessment and Plan / ED Course  I have reviewed the triage vital signs and the nursing notes.  Pertinent labs & imaging results that were  available during my care of the patient were reviewed by me and considered in my medical decision making (see chart for details).  Clinical Course as of Jul 26 1132  Thu Jul 27, 2018  1054 There is no elevation in the white blood cell count, she has mild anemia with a hemoglobin of 9.8, renal function is consistent with end-stage renal disease, there is no hyperkalemia and I personally looked at the chest x-ray which shows no signs of focal infiltrate though there is some interstitial prominence in the lower lungs consistent with possible interstitial edema.   [BM]  1103 Pt reexamined - pulse of 85, no symptoms - will d/w Cardiology at [facility] - Dr. Gilles Chiquito, anticipate d/c home if the patient continues to stay with normal heart rate    [BM]  1128 I discussed the case with Dr. Gilles Chiquito - requests  anticoagulation as outpatient incase this is paroxysmal afib / flutter - and f/u - as outpatient with EP at [facility].  Pt agreeable - she is asymptomatic at this time.     [BM]    Clinical Course User Index [BM] Noemi Chapel, MD      The patient does have subtle rales at the bases bilaterally though she speaks in full sentences and does not appear to be tachypneic.  She is tachycardic to 120, EKG will be performed along with labs, we will need to make sure that she does not need emergent dialysis secondary to fluid overload given her peripheral edema and rales.  She denies a history of interstitial lung disease and has not had any significant findings of pneumonia including fevers or significant cough.  She does have a chronic cough for the last year associated with a postnasal drip and states it has not changed.  The patient is agreeable to the plan  I discussed with the patient and the pharmacist as well as the cardiologist and the patient will need to start on Eliquis twice a day, 5 mg twice daily according to clinical pharmacist.  First dose ordered.  Final Clinical Impressions(s) / ED Diagnoses   Final diagnoses:  Atrial arrhythmia    ED Discharge Orders         Ordered    apixaban (ELIQUIS) 5 MG TABS tablet  2 times daily     07/27/18 1134           Noemi Chapel, MD 07/27/18 1134

## 2018-08-02 ENCOUNTER — Other Ambulatory Visit: Payer: Self-pay

## 2018-08-02 ENCOUNTER — Ambulatory Visit: Payer: Medicare Other | Admitting: Physical Therapy

## 2018-08-02 ENCOUNTER — Encounter: Payer: Self-pay | Admitting: Physical Therapy

## 2018-08-02 DIAGNOSIS — M25672 Stiffness of left ankle, not elsewhere classified: Secondary | ICD-10-CM

## 2018-08-02 DIAGNOSIS — R600 Localized edema: Secondary | ICD-10-CM

## 2018-08-02 DIAGNOSIS — M25572 Pain in left ankle and joints of left foot: Secondary | ICD-10-CM

## 2018-08-02 DIAGNOSIS — M25671 Stiffness of right ankle, not elsewhere classified: Secondary | ICD-10-CM

## 2018-08-02 DIAGNOSIS — R2681 Unsteadiness on feet: Secondary | ICD-10-CM

## 2018-08-02 NOTE — Therapy (Signed)
[facility] [address] [place], [place], [address] Phone: [phone]   Fax:  [phone]  Physical Therapy Treatment  Patient Details  Name: Katie Nunez MRN: [id number] Date of Birth: 06/27/1955 Referring Provider (PT): Candace [name] MD   Encounter Date: 08/02/2018  PT End of Session - 08/02/18 1654    Visit Number  3    Date for PT Re-Evaluation  09/12/18    PT Start Time  1620    PT Stop Time  1715    PT Time Calculation (min)  55 min    Activity Tolerance  Patient tolerated treatment well    Behavior During Therapy  WFL for tasks assessed/performed       Past Medical History:  Diagnosis Date  . Achalasia   . Anxiety   . Dysplasia of cervix, low grade (CIN 1)   . Environmental allergies    "all year long" (12/27/2016)  . ESRD (end stage renal disease) on dialysis (HCC)    "TTS; [facility]" (12/27/2016)  . Fibromyalgia   . GERD (gastroesophageal reflux disease)   . Gout   . Hypertension   . IBS (irritable bowel syndrome)   . MVP (mitral valve prolapse)   . RA (rheumatoid arthritis) (HCC)    FOLLOWED BY DR. SHANAHAN  . Raynaud's disease   . Scleroderma (HCC)   . Seasonal allergies   . Thrombocytopenia (HCC) 07/01/2016   Acute fall to 13,000 07/01/16  . Tubular adenoma 01/08/2008   CECUM  . Vitamin D deficiency     Past Surgical History:  Procedure Laterality Date  . ANKLE FRACTURE SURGERY Right   . AV FISTULA PLACEMENT Left 06/28/2016   Procedure: left arm ARTERIOVENOUS (AV) FISTULA CREATION;  Surgeon: Early, Todd F, MD;  [facility];  Service: Vascular;  Laterality: Left;  . BASCILIC VEIN TRANSPOSITION Left 09/27/2016   Procedure: LEFT UPPER ARM CEPHALIC VEIN TRANSPOSITION;  Surgeon: Early, Todd F, MD;  Location: [facility] OR;  Service: Vascular;  Laterality: Left;  . BREAST BIOPSY     "? side"  . CESAREAN SECTION  1994  . CO2 LASER OF CERVIX    . COLONOSCOPY W/ BIOPSIES  01/08/2008  .  INSERTION OF DIALYSIS CATHETER Right 06/28/2016   Procedure: INSERTION OF DIALYSIS CATHETER, right internal jugular;  Surgeon: Early, Todd F, MD;  Location[facility];  Service: Vascular;  Laterality: Right;  . MYOMECTOMY    . PELVIC LAPAROSCOPY  2011  . superficial thrombophlebitis Left 07-2014    There were no vitals filed for this visit.  Subjective Assessment - 08/02/18 1625    Subjective  Patient reports that she felt better after our last treatment, she reports some drama recently with going in ambulance from dialysis to ED because "they thought I was in A-fib" but I was not.    Currently in Pain?  Yes    Pain Score  4     Pain Location  Ankle    Pain Orientation  Right;Left    Aggravating Factors   stress, inactivity                       [facility] Adult PT Treatment/Exercise - 08/02/18 0001      Knee/Hip Exercises: Stretches   Gastroc Stretch  Both;3 reps;10 seconds      Knee/Hip Exercises: Aerobic   Nustep  level 3 x 5 minutes      Knee/Hip Exercises: Seated     Other Seated Knee/Hip Exercises  foot on sit fit ankle motions PF/DF, inv/Ev and CW /CCW circles both feet    Other Seated Knee/Hip Exercises  red tband ankle motions all directions both       Modalities   Modalities  Electrical Stimulation      Electrical Stimulation   Electrical Stimulation Location  bilateral ankles     Electrical Stimulation Action  pre mod    Electrical Stimulation Parameters  sitting with legs up    Electrical Stimulation Goals  Edema;Pain      Manual Therapy   Manual Therapy  Joint mobilization;Passive ROM    Joint Mobilization  both ankles and mid foot    Passive ROM  ankles and toes               PT Short Term Goals - 08/02/18 1657      PT SHORT TERM GOAL #1   Title  Ind with initial HeP    Status  Achieved        PT Long Term Goals - 07/12/18 1718      PT LONG TERM GOAL #1   Title  Patient to report decreased pain in bil ankles by 50% or more with ADLS.     Time  8    Period  Weeks    Status  New      PT LONG TERM GOAL #2   Title  Patient to demo improved bil ankle DF to 8 degrees actively to normalize ADLS    Time  8    Period  Weeks    Status  New      PT LONG TERM GOAL #3   Title  Improved SLS to 10 seconds bil to decrease fall risk.     Time  8    Period  Weeks    Status  New      PT LONG TERM GOAL #4   Title  tolerate walking 10 minutes    Time  8    Period  Weeks    Status  New            Plan - 08/02/18 1655    Clinical Impression Statement  Patient reports that she has had some good results in the past with estim.  She is much more swollen in the right foot and ankle today, she reports that she has been sitting most of the day.  She did well with her ability to do the exercises, very tight calves.    PT Next Visit Plan  ROM/flexibility bil ankles, balance activities; retrograde massage/manual to lower legs; modalities prn    Consulted and Agree with Plan of Care  Patient       Patient will benefit from skilled therapeutic intervention in order to improve the following deficits and impairments:  Pain, Postural dysfunction, Decreased range of motion, Decreased balance, Increased edema, Impaired flexibility, Abnormal gait, Decreased mobility, Difficulty walking  Visit Diagnosis: 1. Localized edema   2. Unsteadiness on feet   3. Stiffness of left ankle, not elsewhere classified   4. Stiffness of right ankle, not elsewhere classified   5. Pain in left ankle and joints of left foot        Problem List Patient Active Problem List   Diagnosis Date Noted  . Encounter for long-term (current) use of other medications 01/18/2018  . Hypothyroidism 01/18/2018  . Myalgia and myositis 01/18/2018  . Chronic nephritis 01/18/2018  . Other interstitial pulmonary diseases with  fibrosis in diseases classified elsewhere ([place]) 01/18/2018  . Other long term (current) drug therapy 01/18/2018  . Other specified diabetes  mellitus without complications ([place]) 89/37/3428  . Sleep apnea 01/18/2018  . Unspecified persistent mental disorders due to conditions classified elsewhere 01/18/2018  . Venous reflux 01/18/2018  . Vitamin B12 deficiency 01/18/2018  . Vitamin D deficiency 01/18/2018  . Obstructive lung disease ([place]) 12/30/2017  . 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 Chronic cough 11/11/2017  . Pulmonary hypertension ([place]) 11/11/2017  . Pulmonary arterial hypertension ([place]) 10/07/2017  . Acute ITP ([place]) 01/05/2017  . ESRD (end stage renal disease) ([place]) 12/28/2016  . Hemolytic anemia ([place]) 07/26/2016  . Epistaxis, recurrent 07/26/2016  . Thrombocytopenia ([place]) 07/01/2016  . ARF (acute renal failure) ([place]) 06/25/2016  . Anxiety 06/25/2016  . Bilateral carpal tunnel syndrome 10/22/2015  . Chronic gout without tophus 10/22/2015  . Chronic nonintractable headache 10/08/2015  . Fibroid uterus 01/03/2012  . H/O vitamin D deficiency 01/03/2012  . Post-menopausal 01/03/2012  . Hereditary and idiopathic peripheral neuropathy 11/19/2011  . Intestinal malabsorption 11/19/2011  . Lichen planus 76/81/1572  . Low back pain 11/19/2011  . Diffuse spasm of esophagus 11/11/2011  . ESR raised 11/11/2011  . Postinflammatory pulmonary fibrosis ([place]) 11/11/2011  . Scleroderma ([place]) 11/16/2010  . Rheumatoid arthritis ([place]) 11/16/2010  . Raynaud's disease 11/16/2010  . Symptomatic menopausal or female climacteric states 11/16/2010    Sumner Boast., PT 08/02/2018, 4:58 PM  [facility] [place], [place], [address] Phone: [phone]   Fax:  [phone]  Name: Katie Nunez MRN: [id number] Date of Birth: 06-12-1955

## 2018-08-09 ENCOUNTER — Encounter: Payer: Self-pay | Admitting: Physical Therapy

## 2018-08-09 ENCOUNTER — Ambulatory Visit: Payer: Medicare Other | Admitting: Physical Therapy

## 2018-08-09 ENCOUNTER — Other Ambulatory Visit: Payer: Self-pay

## 2018-08-09 DIAGNOSIS — M25671 Stiffness of right ankle, not elsewhere classified: Secondary | ICD-10-CM

## 2018-08-09 DIAGNOSIS — M25672 Stiffness of left ankle, not elsewhere classified: Secondary | ICD-10-CM

## 2018-08-09 DIAGNOSIS — M25572 Pain in left ankle and joints of left foot: Secondary | ICD-10-CM

## 2018-08-09 DIAGNOSIS — R600 Localized edema: Secondary | ICD-10-CM | POA: Diagnosis not present

## 2018-08-09 DIAGNOSIS — R2681 Unsteadiness on feet: Secondary | ICD-10-CM

## 2018-08-09 DIAGNOSIS — M25571 Pain in right ankle and joints of right foot: Secondary | ICD-10-CM

## 2018-08-09 NOTE — Therapy (Signed)
[place], [place], [address] Phone: [phone]   Fax:  [phone]  Physical Therapy Treatment  Patient Details  Name: Katie Nunez MRN: [id number] Date of Birth: 11/17/1955 Referring Provider (PT): [name] MD   Encounter Date: 08/09/2018  PT End of Session - 08/09/18 1722    Visit Number  4    Date for PT Re-Evaluation  09/12/18    PT Start Time  1622    PT Stop Time  1703    PT Time Calculation (min)  41 min    Activity Tolerance  Patient tolerated treatment well    Behavior During Therapy  [facility] for tasks assessed/performed       Past Medical History:  Diagnosis Date  . Achalasia   . Anxiety   . Dysplasia of cervix, low grade (CIN 1)   . Environmental allergies    "all year long" (12/27/2016)  . ESRD (end stage renal disease) on dialysis [facility])    "TTS; [place]" (12/27/2016)  . Fibromyalgia   . GERD (gastroesophageal reflux disease)   . Gout   . Hypertension   . IBS (irritable bowel syndrome)   . MVP (mitral valve prolapse)   . RA (rheumatoid arthritis) (HCC)    FOLLOWED BY DR. SHANAHAN  . Raynaud's disease   . Scleroderma ([place])   . Seasonal allergies   . Thrombocytopenia ([place]) 07/01/2016   Acute fall to 13,000 07/01/16  . Tubular adenoma 01/08/2008   CECUM  . Vitamin D deficiency     Past Surgical History:  Procedure Laterality Date  . ANKLE FRACTURE SURGERY Right   . AV FISTULA PLACEMENT Left 06/28/2016   Procedure: left arm ARTERIOVENOUS (AV) FISTULA CREATION;  Surgeon: Rosetta Posner, MD;  Location: [place];  Service: Vascular;  Laterality: Left;  . BASCILIC VEIN TRANSPOSITION Left 09/27/2016   Procedure: LEFT UPPER ARM CEPHALIC VEIN TRANSPOSITION;  Surgeon: Rosetta Posner, MD;  Location: [place];  Service: Vascular;  Laterality: Left;  . BREAST BIOPSY     "? side"  . CESAREAN SECTION  1994  . CO2 LASER OF CERVIX    . COLONOSCOPY W/ BIOPSIES  01/08/2008  .  INSERTION OF DIALYSIS CATHETER Right 06/28/2016   Procedure: INSERTION OF DIALYSIS CATHETER, right internal jugular;  Surgeon: Rosetta Posner, MD;  Location: [place];  Service: Vascular;  Laterality: Right;  . MYOMECTOMY    . PELVIC LAPAROSCOPY  2011  . superficial thrombophlebitis Left 07-2014    There were no vitals filed for this visit.  Subjective Assessment - 08/09/18 1624    Subjective  Patient comes in some shortness of breath, HR was 150 bpm.  I had her lie down and I started some passive treatment on her feet and ankles, the HE came down to 130, she reports that it is usually high    Currently in Pain?  Yes    Pain Score  4     Pain Location  Ankle    Pain Orientation  Right;Left    Pain Relieving Factors  the treatment seems to help some                       [facility] Adult PT Treatment/Exercise - 08/09/18 0001      Modalities   Modalities  Electrical Stimulation      Electrical Stimulation   Electrical Stimulation Location  bilateral ankles     Electrical  Stimulation Action  premod    Electrical Stimulation Parameters  long sitting, to below her tolerance just to assure no issues    Electrical Stimulation Goals  Edema;Pain      Manual Therapy   Manual Therapy  Joint mobilization;Passive ROM    Joint Mobilization  both ankles and mid foot    Passive ROM  ankles and toes               PT Short Term Goals - 08/02/18 1657      PT SHORT TERM GOAL #1   Title  Ind with initial HeP    Status  Achieved        PT Long Term Goals - 08/09/18 1725      PT LONG TERM GOAL #1   Title  Patient to report decreased pain in bil ankles by 50% or more with ADLS.    Status  On-going            Plan - 08/09/18 1722    Clinical Impression Statement  Patient with a high HR 160 prior to coming to PT, once I had her in long sitting the HR came down to 130, she tells me that she is talking with her MD about it and she is on medication to slow the HR.  I  elected to not do exercise today due to this.  Her ankles are stiff but she reports feeling better after stretching and she does need cues to get the PROM    PT Next Visit Plan  check her heart rate prior to exercise    Consulted and Agree with Plan of Care  Patient       Patient will benefit from skilled therapeutic intervention in order to improve the following deficits and impairments:  Pain, Postural dysfunction, Decreased range of motion, Decreased balance, Increased edema, Impaired flexibility, Abnormal gait, Decreased mobility, Difficulty walking  Visit Diagnosis: 1. Localized edema   2. Unsteadiness on feet   3. Stiffness of left ankle, not elsewhere classified   4. Stiffness of right ankle, not elsewhere classified   5. Pain in left ankle and joints of left foot   6. Pain in right ankle and joints of right foot        Problem List Patient Active Problem List   Diagnosis Date Noted  . Encounter for long-term (current) use of other medications 01/18/2018  . Hypothyroidism 01/18/2018  . Myalgia and myositis 01/18/2018  . Chronic nephritis 01/18/2018  . Other interstitial pulmonary diseases with fibrosis in diseases classified elsewhere ([place]) 01/18/2018  . Other long term (current) drug therapy 01/18/2018  . Other specified diabetes mellitus without complications ([place]) 51/76/1607  . Sleep apnea 01/18/2018  . Unspecified persistent mental disorders due to conditions classified elsewhere 01/18/2018  . Venous reflux 01/18/2018  . Vitamin B12 deficiency 01/18/2018  . Vitamin D deficiency 01/18/2018  . Obstructive lung disease ([place]) 12/30/2017  . 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 Chronic cough 11/11/2017  . Pulmonary hypertension ([place]) 11/11/2017  . Pulmonary arterial hypertension ([place]) 10/07/2017  . Acute ITP ([place]) 01/05/2017  . ESRD (end stage renal disease) ([place]) 12/28/2016  . Hemolytic anemia ([place]) 07/26/2016  .  Epistaxis, recurrent 07/26/2016  . Thrombocytopenia ([place]) 07/01/2016  . ARF (acute renal failure) ([place]) 06/25/2016  . Anxiety 06/25/2016  . Bilateral carpal tunnel syndrome 10/22/2015  . Chronic gout without tophus 10/22/2015  . Chronic nonintractable headache 10/08/2015  . Fibroid uterus 01/03/2012  .  H/O vitamin D deficiency 01/03/2012  . Post-menopausal 01/03/2012  . Hereditary and idiopathic peripheral neuropathy 11/19/2011  . Intestinal malabsorption 11/19/2011  . Lichen planus 47/15/9539  . Low back pain 11/19/2011  . Diffuse spasm of esophagus 11/11/2011  . ESR raised 11/11/2011  . Postinflammatory pulmonary fibrosis ([place]) 11/11/2011  . Scleroderma ([place]) 11/16/2010  . Rheumatoid arthritis ([place]) 11/16/2010  . Raynaud's disease 11/16/2010  . Symptomatic menopausal or female climacteric states 11/16/2010    Sumner Boast., PT 08/09/2018, 5:25 PM  [place], [place], [address] Phone: [phone]   Fax:  [phone]  Name: Katie Nunez MRN: [id number] Date of Birth: 1955-05-08

## 2018-08-11 ENCOUNTER — Inpatient Hospital Stay (HOSPITAL_BASED_OUTPATIENT_CLINIC_OR_DEPARTMENT_OTHER): Payer: Medicare Other | Admitting: Oncology

## 2018-08-11 ENCOUNTER — Other Ambulatory Visit: Payer: Self-pay

## 2018-08-11 ENCOUNTER — Telehealth: Payer: Self-pay | Admitting: Oncology

## 2018-08-11 ENCOUNTER — Inpatient Hospital Stay: Payer: Medicare Other | Attending: Oncology

## 2018-08-11 VITALS — BP 133/72 | HR 96 | Temp 98.7°F | Resp 18 | Ht 65.0 in | Wt 159.4 lb

## 2018-08-11 DIAGNOSIS — D631 Anemia in chronic kidney disease: Secondary | ICD-10-CM | POA: Diagnosis not present

## 2018-08-11 DIAGNOSIS — N189 Chronic kidney disease, unspecified: Secondary | ICD-10-CM | POA: Insufficient documentation

## 2018-08-11 DIAGNOSIS — D693 Immune thrombocytopenic purpura: Secondary | ICD-10-CM

## 2018-08-11 DIAGNOSIS — D6959 Other secondary thrombocytopenia: Secondary | ICD-10-CM

## 2018-08-11 LAB — CBC WITH DIFFERENTIAL (CANCER CENTER ONLY)
Abs Immature Granulocytes: 0.01 10*3/uL (ref 0.00–0.07)
Basophils Absolute: 0 10*3/uL (ref 0.0–0.1)
Basophils Relative: 1 %
Eosinophils Absolute: 0 10*3/uL (ref 0.0–0.5)
Eosinophils Relative: 1 %
HCT: 32.7 % — ABNORMAL LOW (ref 36.0–46.0)
Hemoglobin: 10.6 g/dL — ABNORMAL LOW (ref 12.0–15.0)
Immature Granulocytes: 0 %
Lymphocytes Relative: 21 %
Lymphs Abs: 0.7 10*3/uL (ref 0.7–4.0)
MCH: 29.9 pg (ref 26.0–34.0)
MCHC: 32.4 g/dL (ref 30.0–36.0)
MCV: 92.4 fL (ref 80.0–100.0)
Monocytes Absolute: 0.4 10*3/uL (ref 0.1–1.0)
Monocytes Relative: 13 %
Neutro Abs: 2 10*3/uL (ref 1.7–7.7)
Neutrophils Relative %: 64 %
Platelet Count: 158 10*3/uL (ref 150–400)
RBC: 3.54 MIL/uL — ABNORMAL LOW (ref 3.87–5.11)
RDW: 15.5 % (ref 11.5–15.5)
WBC Count: 3.1 10*3/uL — ABNORMAL LOW (ref 4.0–10.5)
nRBC: 0 % (ref 0.0–0.2)

## 2018-08-11 NOTE — Telephone Encounter (Signed)
Scheduled per sch msg. Mailed printout.  °

## 2018-08-11 NOTE — Progress Notes (Signed)
Hematology and Oncology Follow Up Visit  Katie Nunez [id number] 1955-07-18 63 y.o. 08/11/2018 8:57 AM [name], MDSmith, Hal Hope, MD   Principle Diagnosis: 63 year old woman with ITP diagnosed in February 2019.  She presented with a platelet count of 31,000.   Prior Therapy:  She was treated with corticosteroids followed by rituximab under the care of Dr. Beryle Beams with prednisone taper concluded in May 2019.  She has been in remission since that time.  Current therapy: Active surveillance.  Interim History: Katie Nunez presents today for a follow-up visit.  Since last visit, he reports no major changes in her health.  She was diagnosed with a pulmonary as well as paroxysmal atrial fibrillation.  She does report some fatigue and some tiredness and occasional dyspnea which is still able to attend to activities of daily living.  She denies any bleeding complications including hematochezia, melena or bruising.   Patient denied any alteration mental status, neuropathy, confusion or dizziness.  Denies any headaches or lethargy.  Denies any night sweats, weight loss or changes in appetite.  Denied orthopnea, dyspnea on exertion or chest discomfort.  Denies shortness of breath, difficulty breathing hemoptysis or cough.  Denies any abdominal distention, nausea, early satiety or dyspepsia.  Denies any hematuria, frequency, dysuria or nocturia.  Denies any skin irritation, dryness or rash.  Denies any ecchymosis or petechiae.  Denies any lymphadenopathy or clotting.  Denies any heat or cold intolerance.  Denies any anxiety or depression.  Remaining review of system is negative.    Medications: I have reviewed the patient's current medications.  Current Outpatient Medications  Medication Sig Dispense Refill  . acetaminophen (TYLENOL) 500 MG tablet Take 500-1,000 mg by mouth every 6 (six) hours as needed (for pain/headaches.).     Marland Kitchen albuterol (PROAIR HFA) 108 (90 Base) MCG/ACT inhaler  Inhale 2 puffs into the lungs every 6 (six) hours as needed for wheezing or shortness of breath.     Marland Kitchen ambrisentan (LETAIRIS) 5 MG tablet Take 5 mg by mouth at bedtime.     Marland Kitchen apixaban (ELIQUIS) 5 MG TABS tablet Take 1 tablet (5 mg total) by mouth 2 (two) times daily. 60 tablet 0  . azelastine (ASTELIN) 0.1 % nasal spray Place 1 spray into both nostrils daily.  2  . cetirizine (ZYRTEC) 10 MG tablet Take 10 mg by mouth daily as needed (for allergies/alternates with singulair).     . cinacalcet (SENSIPAR) 30 MG tablet Take 30 mg by mouth at bedtime.     . ciprofloxacin (CIPRO) 500 MG tablet Take 500 mg by mouth 2 (two) times a day.    . clonazePAM (KLONOPIN) 0.5 MG tablet Take 0.5 mg by mouth 2 (two) times daily as needed for anxiety.     . cyclobenzaprine (FLEXERIL) 5 MG tablet Take 1 tablet (5 mg total) by mouth 3 (three) times daily as needed for muscle spasms. (Patient taking differently: Take 10 mg by mouth 3 (three) times daily as needed for muscle spasms. ) 30 tablet 0  . docusate sodium (COLACE) 100 MG capsule Take 2 capsules (200 mg total) by mouth 2 (two) times daily. (Patient taking differently: Take 200 mg by mouth 2 (two) times daily as needed (for constipation). ) 10 capsule 0  . ferric citrate (AURYXIA) 1 GM 210 MG(Fe) tablet Take 420 mg by mouth 3 (three) times daily with meals.     . fluticasone (FLONASE) 50 MCG/ACT nasal spray Place 1 spray into the nose daily.    Marland Kitchen  gabapentin (NEURONTIN) 100 MG capsule Take 100 mg by mouth daily as needed (pain).     Marland Kitchen loperamide (IMODIUM) 2 MG capsule Take 2-4 mg by mouth 3 (three) times daily as needed for diarrhea or loose stools.    . montelukast (SINGULAIR) 10 MG tablet Take 10 mg by mouth daily as needed (allergy symptoms).   1  . multivitamin (RENA-VIT) TABS tablet Take 1 tablet by mouth daily.     . pantoprazole (PROTONIX) 40 MG tablet TAKE 1 TABLET(40 MG) BY MOUTH DAILY 90 tablet 1  . phenylephrine (NEO-SYNEPHRINE) 0.5 % nasal solution Place  1 drop into both nostrils every 6 (six) hours as needed for congestion. 15 mL 1  . Spacer/Aero-Holding Chambers DEVI Use spacer with albuterol 1 each 0  . tadalafil (CIALIS) 20 MG tablet Take 1 tablet (20 mg total) by mouth daily as needed for erectile dysfunction. (Patient taking differently: Take 40 mg by mouth at bedtime. ) 10 tablet 0   No current facility-administered medications for this visit.      Allergies:  Allergies  Allergen Reactions  . Savella [Milnacipran Hcl] Palpitations and Other (See Comments)    Unknown  . Tape Rash and Other (See Comments)    Welts-- unsure if it was paper or adhesive tape    Past Medical History, Surgical history, Social history, and Family History were reviewed and updated.   Physical Exam:  ECOG: 1    General appearance: Comfortable appearing without any discomfort Head: Normocephalic without any trauma Oropharynx: Mucous membranes are moist and pink without any thrush or ulcers. Eyes: Pupils are equal and round reactive to light. Lymph nodes: No cervical, supraclavicular, inguinal or axillary lymphadenopathy.   Heart:regular rate and rhythm.  S1 and S2 without leg edema. Lung: Clear without any rhonchi or wheezes.  No dullness to percussion. Abdomin: Soft, nontender, nondistended with good bowel sounds.  No hepatosplenomegaly. Musculoskeletal: No joint deformity or effusion.  Full range of motion noted. Neurological: No deficits noted on motor, sensory and deep tendon reflex exam. Skin: No petechial rash or dryness.  Appeared moist.     Lab Results: Lab Results  Component Value Date   WBC 3.5 (L) 07/27/2018   HGB 9.8 (L) 07/27/2018   HCT 31.0 (L) 07/27/2018   MCV 93.9 07/27/2018   PLT 140 (L) 07/27/2018     Chemistry      Component Value Date/Time   NA 141 07/27/2018 0946   NA 140 09/26/2017 1048   K 3.2 (L) 07/27/2018 0946   CL 105 07/27/2018 0946   CO2 23 07/27/2018 0946   BUN 33 (H) 07/27/2018 0946   BUN 47 (H)  09/26/2017 1048   CREATININE 7.36 (H) 07/27/2018 0946      Component Value Date/Time   CALCIUM 7.6 (L) 07/27/2018 0946   ALKPHOS 109 07/27/2018 0946   AST 24 07/27/2018 0946   ALT 14 07/27/2018 0946   BILITOT 0.9 07/27/2018 0946   BILITOT 0.3 09/26/2017 1048         Impression and Plan:  63 year old woman with:  1.  ITP presented with a platelet count of 30,000 and February 2019.  She has been in remission after initial treatment with prednisone and rituximab.    Laboratory data on July 27, 2018 were reviewed and continues to show platelet count of 140,000 indicating remission.  The natural course of this disease and salvage therapy in the future were reiterated.  At this time she does not require any intervention.  These include growth factor support, colectomy, and others.  Complication associated with these therapies include injection related issues and complications related to surgery that could be problematic for her given her comorbidities.  CBC from today confirmed normal platelet count and continued remission.   2.  Anemia: Related to chronic renal insufficiency.  No need for any additional treatment at this time.  She is currently taking oral iron and receives growth factor support with dialysis.   3.  Follow-up: In 6 months for repeat evaluation.  15  minutes was spent with the patient face-to-face today.  More than 50% of time was spent on reviewing her disease status, treatment options and addressing complications related to future therapies.    Zola Button, MD 7/31/20208:57 AM

## 2018-08-14 DIAGNOSIS — R002 Palpitations: Secondary | ICD-10-CM | POA: Insufficient documentation

## 2018-08-16 ENCOUNTER — Ambulatory Visit: Payer: Medicare Other | Admitting: Physical Therapy

## 2018-08-17 ENCOUNTER — Telehealth: Payer: Self-pay | Admitting: Orthopedic Surgery

## 2018-08-17 NOTE — Telephone Encounter (Signed)
Patient called needing to know what type of metal was used for her 02/03/1998 R Ankle surgery fixation plates & screws. Dr Sharol Given reviewed OP note that I printed [facility] and he stated it was stainless steele. I advised patient he stated stainless steele plates & screws was used. She expressed understanding and had no questions.

## 2018-08-23 ENCOUNTER — Ambulatory Visit: Payer: Medicare Other | Attending: Family Medicine | Admitting: Physical Therapy

## 2018-08-23 ENCOUNTER — Encounter: Payer: Self-pay | Admitting: Physical Therapy

## 2018-08-23 ENCOUNTER — Other Ambulatory Visit: Payer: Self-pay

## 2018-08-23 DIAGNOSIS — M25672 Stiffness of left ankle, not elsewhere classified: Secondary | ICD-10-CM | POA: Insufficient documentation

## 2018-08-23 DIAGNOSIS — R600 Localized edema: Secondary | ICD-10-CM | POA: Diagnosis present

## 2018-08-23 DIAGNOSIS — R2681 Unsteadiness on feet: Secondary | ICD-10-CM | POA: Insufficient documentation

## 2018-08-23 NOTE — Therapy (Signed)
[place], [place], [address] Phone: [phone]   Fax:  [phone]  Physical Therapy Treatment  Patient Details  Name: Katie Nunez MRN: [id number] Date of Birth: [birth year] Referring Provider (PT): [name] MD   Encounter Date: 08/23/2018  PT End of Session - 08/23/18 1649    Visit Number  5    Date for PT Re-Evaluation  09/12/18    PT Start Time  4665    PT Stop Time  1645    PT Time Calculation (min)  29 min    Activity Tolerance  Patient tolerated treatment well    Behavior During Therapy  [facility] for tasks assessed/performed       Past Medical History:  Diagnosis Date  . Achalasia   . Anxiety   . Dysplasia of cervix, low grade (CIN 1)   . Environmental allergies    "all year long" (12/27/2016)  . ESRD (end stage renal disease) on dialysis [facility])    "TTS; [place]" (12/27/2016)  . Fibromyalgia   . GERD (gastroesophageal reflux disease)   . Gout   . Hypertension   . IBS (irritable bowel syndrome)   . MVP (mitral valve prolapse)   . RA (rheumatoid arthritis) (HCC)    FOLLOWED BY DR. SHANAHAN  . Raynaud's disease   . Scleroderma ([place])   . Seasonal allergies   . Thrombocytopenia ([place]) 07/01/2016   Acute fall to 13,000 07/01/16  . Tubular adenoma 01/08/2008   CECUM  . Vitamin D deficiency     Past Surgical History:  Procedure Laterality Date  . ANKLE FRACTURE SURGERY Right   . AV FISTULA PLACEMENT Left 06/28/2016   Procedure: left arm ARTERIOVENOUS (AV) FISTULA CREATION;  Surgeon: Rosetta Posner, MD;  Location: [place];  Service: Vascular;  Laterality: Left;  . BASCILIC VEIN TRANSPOSITION Left 09/27/2016   Procedure: LEFT UPPER ARM CEPHALIC VEIN TRANSPOSITION;  Surgeon: Rosetta Posner, MD;  Location: [place];  Service: Vascular;  Laterality: Left;  . BREAST BIOPSY     "? side"  . CESAREAN SECTION  1994  . CO2 LASER OF CERVIX    . COLONOSCOPY W/ BIOPSIES  01/08/2008  .  INSERTION OF DIALYSIS CATHETER Right 06/28/2016   Procedure: INSERTION OF DIALYSIS CATHETER, right internal jugular;  Surgeon: Rosetta Posner, MD;  Location: [place];  Service: Vascular;  Laterality: Right;  . MYOMECTOMY    . PELVIC LAPAROSCOPY  2011  . superficial thrombophlebitis Left 07-2014    There were no vitals filed for this visit.  Subjective Assessment - 08/23/18 1619    Subjective  "Im here" no pain just fatigue    Patient Stated Goals  decrease tightness to prevent falls.     Currently in Pain?  No/denies                       [facility] Adult PT Treatment/Exercise - 08/23/18 0001      Knee/Hip Exercises: Aerobic   Nustep  level 3 x 5 minutes      Knee/Hip Exercises: Seated   Other Seated Knee/Hip Exercises  red tband ankle motions all directions both  x 15      Manual Therapy   Manual Therapy  Joint mobilization;Soft tissue mobilization    Joint Mobilization  both ankles and mid foot    Soft tissue mobilization  bilat calves    Passive ROM  ankles  PT Short Term Goals - 08/02/18 1657      PT SHORT TERM GOAL #1   Title  Ind with initial HeP    Status  Achieved        PT Long Term Goals - 08/09/18 1725      PT LONG TERM GOAL #1   Title  Patient to report decreased pain in bil ankles by 50% or more with ADLS.    Status  On-going            Plan - 08/23/18 1651    Clinical Impression Statement  Pt ~ 16 minutes late for today's treatment session. HR taken before session was 97, introduced her back to some exercise. She did well with wit aerobic warm up and Tband exercises. Bilat ankle swelling noted with MT, but had good motion.    Stability/Clinical Decision Making  Evolving/Moderate complexity    Rehab Potential  Good    PT Frequency  2x / week    PT Duration  8 weeks    PT Treatment/Interventions  ADLs/Self Care Home Management;Cryotherapy;Electrical Stimulation;Ultrasound;Therapeutic exercise;Balance  training;Patient/family education;Manual techniques;Passive range of motion;Dry needling;Therapeutic activities;Neuromuscular re-education    PT Next Visit Plan  check her heart rate prior to exercise       Patient will benefit from skilled therapeutic intervention in order to improve the following deficits and impairments:  Pain, Postural dysfunction, Decreased range of motion, Decreased balance, Increased edema, Impaired flexibility, Abnormal gait, Decreased mobility, Difficulty walking  Visit Diagnosis: 1. Unsteadiness on feet   2. Localized edema   3. Stiffness of left ankle, not elsewhere classified        Problem List Patient Active Problem List   Diagnosis Date Noted  . Encounter for long-term (current) use of other medications 01/18/2018  . Hypothyroidism 01/18/2018  . Myalgia and myositis 01/18/2018  . Chronic nephritis 01/18/2018  . Other interstitial pulmonary diseases with fibrosis in diseases classified elsewhere ([place]) 01/18/2018  . Other long term (current) drug therapy 01/18/2018  . Other specified diabetes mellitus without complications ([place]) 17/79/3903  . Sleep apnea 01/18/2018  . Unspecified persistent mental disorders due to conditions classified elsewhere 01/18/2018  . Venous reflux 01/18/2018  . Vitamin B12 deficiency 01/18/2018  . Vitamin D deficiency 01/18/2018  . Obstructive lung disease ([place]) 12/30/2017  . 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 Chronic cough 11/11/2017  . Pulmonary hypertension ([place]) 11/11/2017  . Pulmonary arterial hypertension ([place]) 10/07/2017  . Acute ITP ([place]) 01/05/2017  . ESRD (end stage renal disease) ([place]) 12/28/2016  . Hemolytic anemia ([place]) 07/26/2016  . Epistaxis, recurrent 07/26/2016  . Thrombocytopenia ([place]) 07/01/2016  . ARF (acute renal failure) ([place]) 06/25/2016  . Anxiety 06/25/2016  . Bilateral carpal tunnel syndrome 10/22/2015  . Chronic gout without tophus  10/22/2015  . Chronic nonintractable headache 10/08/2015  . Fibroid uterus 01/03/2012  . H/O vitamin D deficiency 01/03/2012  . Post-menopausal 01/03/2012  . Hereditary and idiopathic peripheral neuropathy 11/19/2011  . Intestinal malabsorption 11/19/2011  . Lichen planus 00/92/3300  . Low back pain 11/19/2011  . Diffuse spasm of esophagus 11/11/2011  . ESR raised 11/11/2011  . Postinflammatory pulmonary fibrosis ([place]) 11/11/2011  . Scleroderma ([place]) 11/16/2010  . Rheumatoid arthritis ([place]) 11/16/2010  . Raynaud's disease 11/16/2010  . Symptomatic menopausal or female climacteric states 11/16/2010    Scot Jun, PTA 08/23/2018, 4:55 PM  [place], [place],  [address] Phone: [phone]   Fax:  [phone]  Name: Katie Nunez MRN: [id number] Date of Birth: Mar 17, 1955

## 2018-08-28 ENCOUNTER — Encounter: Payer: Self-pay | Admitting: Oncology

## 2018-08-30 ENCOUNTER — Ambulatory Visit: Payer: Medicare Other | Admitting: Physical Therapy

## 2018-09-08 ENCOUNTER — Encounter: Payer: Self-pay | Admitting: Emergency Medicine

## 2018-09-08 ENCOUNTER — Other Ambulatory Visit: Payer: Self-pay

## 2018-09-08 ENCOUNTER — Ambulatory Visit (INDEPENDENT_AMBULATORY_CARE_PROVIDER_SITE_OTHER): Payer: Medicare Other | Admitting: Emergency Medicine

## 2018-09-08 VITALS — BP 130/76 | HR 99 | Ht 65.0 in | Wt 158.0 lb

## 2018-09-08 DIAGNOSIS — J449 Chronic obstructive pulmonary disease, unspecified: Secondary | ICD-10-CM

## 2018-09-08 DIAGNOSIS — J8417 Other interstitial pulmonary diseases with fibrosis in diseases classified elsewhere: Secondary | ICD-10-CM | POA: Diagnosis not present

## 2018-09-08 DIAGNOSIS — R05 Cough: Secondary | ICD-10-CM

## 2018-09-08 DIAGNOSIS — J84178 Other interstitial pulmonary diseases with fibrosis in diseases classified elsewhere: Secondary | ICD-10-CM

## 2018-09-08 DIAGNOSIS — I2721 Secondary pulmonary arterial hypertension: Secondary | ICD-10-CM

## 2018-09-08 DIAGNOSIS — J849 Interstitial pulmonary disease, unspecified: Secondary | ICD-10-CM | POA: Diagnosis not present

## 2018-09-08 DIAGNOSIS — M359 Systemic involvement of connective tissue, unspecified: Secondary | ICD-10-CM

## 2018-09-08 DIAGNOSIS — R053 Chronic cough: Secondary | ICD-10-CM

## 2018-09-08 NOTE — Assessment & Plan Note (Signed)
Minimal obstruction.  She has albuterol although notes that is not very efficacious.  She does have it available to use in times of need.

## 2018-09-08 NOTE — Assessment & Plan Note (Signed)
Principally driven by allergic rhinitis.  She is having difficulty dealing with thick mucus, the viscosity being affected apparently by her Atrovent nasal spray.  I recommended adding back scheduled nasal saline rinses to see if she will benefit.  She is not interested in evaluating for immunotherapy at this time.  Continue ipratropium (Atrovent) nasal spray, 2 sprays 2-3 times daily as needed for congestion. Continue fluticasone nasal spray and Xyzal as you have been taking them You may want to start doing nasal saline rinses every day on a schedule.

## 2018-09-08 NOTE — Progress Notes (Signed)
Subjective:    Patient ID: Katie Nunez, female    DOB: Nov 04, 1955, 63 y.o.   MRN: [id number]  Cough Pertinent negatives include no ear pain, eye redness, fever, headaches, postnasal drip, rash, rhinorrhea, sore throat, shortness of breath or wheezing.    ROV 12/30/17 --63 year old woman with scleroderma, RA, ray nodes, end-stage renal disease on hemodialysis and history of ITP.  PAH that is under evaluation at [facility], has had R heart cath since last time. They are planning to start targeted PAH meds, adcirca and tadalafil. She was started on O2 after dersat on 6 min walk. PFT with mixed restriction and obstruction - unclear that she has benefited from albuterol in the past.   She has multifactorial dyspnea and chronic cough in the setting of allergic rhinitis and GERD.  We treated her GERD with pantoprazole, still with a little bit of breakthrough.  She has significant chronic rhinitis even on Xyzal, fluticasone nasal spray, Atrovent nasal spray and [place].  We performed a CT scan of her sinuses on 12/05/2017 that I reviewed, shows some mild thickening in the ethmoid air cells and sphenoid sinuses but without any evidence for acute or chronic sinusitis. She continues to have nasal congestion and drainage.   ROV 09/08/2018 --Katie Nunez is 22, a never smoker with a history of scleroderma, rheumatoid arthritis, Raynaud's phenomenon, end-stage renal disease on hemodialysis.  She also has a history of ITP in the past.  She has pulmonary arterial hypertension in the setting of all the above. She had a repeat R heart cath 08/25/2018 at [facility] >> PAPmean 44, PAOP 15, PVR 4.7, CO 6.2; is currently managed on ambrisentan, tadalafil, and just started selexipag.   Chest CT 10/05/2017 showed no evidence of ILD, some mild lingular of RML scar.  She had a repeat chest x-ray done in July of this year, showed interstitial prominence.  Nonspecific finding  She has mixed obstruction and restriction on her pulmonary  function testing.  We have not had her on scheduled bronchodilator therapy. She still has albuterol available and uses it very rarely.  Reports minimal dyspnea - does have some limitations due to myalgias, is bothered some by stomach bloating.  She has chronic cough with GERD and rhinitis.  She reports that she is still having nasal mucous and obstruction, does get dried out. She is on flonase, ipratropium seems to be causing the drying. She still has to deal with the thick mucous, still coughs. Hasn't been taking singulair. Zyrtec was changed to Xyzal. Not interested in getting immunotherapy right now. She uses nasal washes prn.     Review of Systems  Constitutional: Negative for fever and unexpected weight change.  HENT: Positive for congestion and sneezing. Negative for dental problem, ear pain, nosebleeds, postnasal drip, rhinorrhea, sinus pressure, sore throat and trouble swallowing.   Eyes: Negative for redness and itching.  Respiratory: Positive for cough. Negative for chest tightness, shortness of breath and wheezing.   Cardiovascular: Negative for palpitations and leg swelling.  Gastrointestinal: Negative for nausea and vomiting.  Genitourinary: Negative for dysuria.  Musculoskeletal: Negative for joint swelling.  Skin: Negative for rash.  Neurological: Negative for headaches.  Hematological: Does not bruise/bleed easily.  Psychiatric/Behavioral: Negative for dysphoric mood. The patient is not nervous/anxious.    Past Medical History:  Diagnosis Date  . Achalasia   . Anxiety   . Dysplasia of cervix, low grade (CIN 1)   . Environmental allergies    "all year long" (12/27/2016)  .  ESRD (end stage renal disease) on dialysis [facility])    "TTS; [place]" (12/27/2016)  . Fibromyalgia   . GERD (gastroesophageal reflux disease)   . Gout   . Hypertension   . IBS (irritable bowel syndrome)   . MVP (mitral valve prolapse)   . RA (rheumatoid arthritis) (HCC)    FOLLOWED BY DR. SHANAHAN   . Raynaud's disease   . Scleroderma ([place])   . Seasonal allergies   . Thrombocytopenia ([place]) 07/01/2016   Acute fall to 13,000 07/01/16  . Tubular adenoma 01/08/2008   CECUM  . Vitamin D deficiency      Family History  Problem Relation Age of Onset  . Hypertension Mother   . Diabetes Mother   . Heart disease Father   . Hypertension Maternal Aunt   . Diabetes Maternal Grandmother   . Heart disease Paternal Grandfather   . Cerebral palsy Cousin        1ST COUSIN?  . Diabetes Paternal Grandmother      Social History   Socioeconomic History  . Marital status: Married    Spouse name: Not on file  . Number of children: Not on file  . Years of education: Not on file  . Highest education level: Not on file  Occupational History  . Not on file  Social Needs  . Financial resource strain: Not on file  . Food insecurity    Worry: Not on file    Inability: Not on file  . Transportation needs    Medical: Not on file    Non-medical: Not on file  Tobacco Use  . Smoking status: Never Smoker  . Smokeless tobacco: Never Used  Substance and Sexual Activity  . Alcohol use: No  . Drug use: No  . Sexual activity: Not Currently    Birth control/protection: Post-menopausal  Lifestyle  . Physical activity    Days per week: Not on file    Minutes per session: Not on file  . Stress: Not on file  Relationships  . Social Herbalist on phone: Not on file    Gets together: Not on file    Attends religious service: Not on file    Active member of club or organization: Not on file    Attends meetings of clubs or organizations: Not on file    Relationship status: Not on file  . Intimate partner violence    Fear of current or ex partner: Not on file    Emotionally abused: Not on file    Physically abused: Not on file    Forced sexual activity: Not on file  Other Topics Concern  . Not on file  Social History Narrative  . Not on file     Allergies  Allergen Reactions  .  Savella [Milnacipran Hcl] Palpitations and Other (See Comments)    Unknown  . Tape Rash and Other (See Comments)    Welts-- unsure if it was paper or adhesive tape     Outpatient Medications Prior to Visit  Medication Sig Dispense Refill  . acetaminophen (TYLENOL) 500 MG tablet Take 500-1,000 mg by mouth every 6 (six) hours as needed (for pain/headaches.).     Marland Kitchen albuterol (PROAIR HFA) 108 (90 Base) MCG/ACT inhaler Inhale 2 puffs into the lungs every 6 (six) hours as needed for wheezing or shortness of breath.     Marland Kitchen ambrisentan (LETAIRIS) 5 MG tablet Take 5 mg by mouth at bedtime.     . cinacalcet (SENSIPAR)  30 MG tablet Take 30 mg by mouth at bedtime.     . clonazePAM (KLONOPIN) 0.5 MG tablet Take 0.5 mg by mouth 2 (two) times daily as needed for anxiety.     . cyclobenzaprine (FLEXERIL) 5 MG tablet Take 1 tablet (5 mg total) by mouth 3 (three) times daily as needed for muscle spasms. (Patient taking differently: Take 10 mg by mouth 3 (three) times daily as needed for muscle spasms. ) 30 tablet 0  . docusate sodium (COLACE) 100 MG capsule Take 2 capsules (200 mg total) by mouth 2 (two) times daily. (Patient taking differently: Take 200 mg by mouth 2 (two) times daily as needed (for constipation). ) 10 capsule 0  . ferric citrate (AURYXIA) 1 GM 210 MG(Fe) tablet Take 420 mg by mouth 3 (three) times daily with meals.     . fluticasone (FLONASE) 50 MCG/ACT nasal spray Place 1 spray into the nose daily.    Marland Kitchen gabapentin (NEURONTIN) 100 MG capsule Take 100 mg by mouth daily as needed (pain).     Marland Kitchen ipratropium (ATROVENT) 0.06 % nasal spray Place 2 sprays into both nostrils 4 (four) times daily.    Marland Kitchen levocetirizine (XYZAL) 5 MG tablet Take 5 mg by mouth every evening.    . loperamide (IMODIUM) 2 MG capsule Take 2-4 mg by mouth 3 (three) times daily as needed for diarrhea or loose stools.    . montelukast (SINGULAIR) 10 MG tablet Take 10 mg by mouth daily as needed (allergy symptoms).   1  .  multivitamin (RENA-VIT) TABS tablet Take 1 tablet by mouth daily.     . pantoprazole (PROTONIX) 40 MG tablet TAKE 1 TABLET(40 MG) BY MOUTH DAILY 90 tablet 1  . phenylephrine (NEO-SYNEPHRINE) 0.5 % nasal solution Place 1 drop into both nostrils every 6 (six) hours as needed for congestion. 15 mL 1  . Spacer/Aero-Holding Chambers DEVI Use spacer with albuterol 1 each 0  . TADALAFIL PO Take 40 mg by mouth daily.    Marland Kitchen azelastine (ASTELIN) 0.1 % nasal spray Place 1 spray into both nostrils daily.  2  . cetirizine (ZYRTEC) 10 MG tablet Take 10 mg by mouth daily as needed (for allergies/alternates with singulair).     Marland Kitchen apixaban (ELIQUIS) 5 MG TABS tablet Take 1 tablet (5 mg total) by mouth 2 (two) times daily. 60 tablet 0  . ciprofloxacin (CIPRO) 500 MG tablet Take 500 mg by mouth 2 (two) times a day.    . tadalafil (CIALIS) 20 MG tablet Take 1 tablet (20 mg total) by mouth daily as needed for erectile dysfunction. (Patient taking differently: Take 40 mg by mouth at bedtime. ) 10 tablet 0   No facility-administered medications prior to visit.         Objective:   Physical Exam Vitals:   09/08/18 1212  BP: 130/76  Pulse: 99  SpO2: 96%  Weight: 158 lb (71.7 kg)  Height: 5\' 5"  (1.651 m)   Gen: Pleasant, well-nourished, in no distress,  normal affect  ENT: No lesions,  mouth clear,  oropharynx clear, no postnasal drip  Neck: No JVD, no stridor  Lungs: No use of accessory muscles, decreased at bases, no wheeze or crackles.   Cardiovascular: RRR, heart sounds normal, no murmur or gallops, 1+ B ankle peripheral edema  Musculoskeletal: No deformities, no cyanosis or clubbing  Neuro: alert, non focal  Skin: Warm, no lesions or rash     Assessment & Plan:  Pulmonary artery hypertension associated with  connective tissue disease ([place]) Please continue your ambrisentan, tadalafil, selexipag as directed by Dr. Gilles Chiquito at [facility].  Other interstitial pulmonary diseases with fibrosis in diseases  classified elsewhere [facility]) No clear evidence for ILD on her CT from 1 year ago but she did have a chest x-ray in July with interstitial prominence particularly in the bases.  This is nonspecific and in a patient on hemodialysis certainly could reflect volume status.  All the same I think it would be reasonable to repeat her CT to ensure no interval change.  We will perform a high-resolution CT scan and follow-up to review.  Obstructive lung disease (HCC) Minimal obstruction.  She has albuterol although notes that is not very efficacious.  She does have it available to use in times of need.  Chronic cough Principally driven by allergic rhinitis.  She is having difficulty dealing with thick mucus, the viscosity being affected apparently by her Atrovent nasal spray.  I recommended adding back scheduled nasal saline rinses to see if she will benefit.  She is not interested in evaluating for immunotherapy at this time.  Continue ipratropium (Atrovent) nasal spray, 2 sprays 2-3 times daily as needed for congestion. Continue fluticasone nasal spray and Xyzal as you have been taking them You may want to start doing nasal saline rinses every day on a schedule.   Baltazar Apo, MD, PhD 09/08/2018, 12:47 PM [facility] [phone] or if no answer [phone]

## 2018-09-08 NOTE — Assessment & Plan Note (Signed)
No clear evidence for ILD on her CT from 1 year ago but she did have a chest x-ray in July with interstitial prominence particularly in the bases.  This is nonspecific and in a patient on hemodialysis certainly could reflect volume status.  All the same I think it would be reasonable to repeat her CT to ensure no interval change.  We will perform a high-resolution CT scan and follow-up to review.

## 2018-09-08 NOTE — Patient Instructions (Addendum)
Please continue your ambrisentan, tadalafil, selexipag as directed by Dr. Gilles Chiquito at [facility]. We will repeat your high-resolution CT scan of the chest next month to compare with last year. Continue ipratropium (Atrovent) nasal spray, 2 sprays 2-3 times daily as needed for congestion. Continue fluticasone nasal spray and Xyzal as you have been taking them You may want to start doing nasal saline rinses every day on a schedule. Keep albuterol available use 2 puffs if needed for shortness of breath, chest tightness, wheezing. Tele-visit with Dr Lamonte Sakai in September after your CT scan so that we can discuss the results together. Follow with Dr Lamonte Sakai in the office in 6 months or sooner if you have any problems

## 2018-09-08 NOTE — Assessment & Plan Note (Signed)
Please continue your ambrisentan, tadalafil, selexipag as directed by Dr. Gilles Chiquito at [facility].

## 2018-10-03 NOTE — Telephone Encounter (Signed)
Sent MyChart message to pt w/ RB's response below and let her know to let us know if there is anything else we can help with. Nothing further needed at this time.

## 2018-10-03 NOTE — Telephone Encounter (Signed)
Yes I agree with this plan.  Let us try to get copies of the MRIs from [facility] to review, hold off on the CT scan until I have a chance to see these.

## 2018-10-03 NOTE — Telephone Encounter (Signed)
RB please advise. Thanks.  

## 2018-10-04 ENCOUNTER — Inpatient Hospital Stay: Admission: RE | Admit: 2018-10-04 | Payer: Medicare Other | Source: Ambulatory Visit

## 2018-11-05 ENCOUNTER — Other Ambulatory Visit: Payer: Self-pay | Admitting: Oncology

## 2018-11-06 ENCOUNTER — Telehealth: Payer: Self-pay | Admitting: *Deleted

## 2018-11-06 NOTE — Telephone Encounter (Signed)
Message relayed to patient.

## 2018-11-06 NOTE — Telephone Encounter (Signed)
No intervention needed. This is unrelated to her ITP.

## 2018-11-06 NOTE — Telephone Encounter (Signed)
Patient called to report having persistent issue with subconjunctival hemorrhage of the right eye that is now she has bruising under the eye.  She denies having any injury that could contribute to the injury.  She has history of ITP and that when she goes to dialysis they noticed that her platelets were decreasing and the trend has been a slow decline from 148, 110, and now 94.  She was concerned if it could be related and if she should do anything proactively about it considering her history of ITP.  Message routed to Dr. Alen Blew for advice.

## 2019-01-01 ENCOUNTER — Encounter: Payer: Medicare Other | Admitting: Family

## 2019-01-01 ENCOUNTER — Encounter: Payer: Self-pay | Admitting: Family

## 2019-01-01 ENCOUNTER — Other Ambulatory Visit: Payer: Self-pay

## 2019-01-01 VITALS — BP 140/70 | Ht 65.0 in | Wt 155.4 lb

## 2019-01-01 DIAGNOSIS — I770 Arteriovenous fistula, acquired: Secondary | ICD-10-CM

## 2019-01-01 DIAGNOSIS — N186 End stage renal disease: Secondary | ICD-10-CM

## 2019-01-02 ENCOUNTER — Other Ambulatory Visit: Payer: Self-pay

## 2019-01-02 DIAGNOSIS — M79676 Pain in unspecified toe(s): Secondary | ICD-10-CM

## 2019-01-08 ENCOUNTER — Other Ambulatory Visit: Payer: Self-pay

## 2019-01-08 ENCOUNTER — Ambulatory Visit (HOSPITAL_COMMUNITY)
Admission: RE | Admit: 2019-01-08 | Discharge: 2019-01-08 | Disposition: A | Discharge status: Home / Self Care | Payer: Medicare Other | Source: Ambulatory Visit | Attending: Family | Admitting: Family

## 2019-01-08 DIAGNOSIS — M79676 Pain in unspecified toe(s): Secondary | ICD-10-CM | POA: Diagnosis present

## 2019-01-08 DIAGNOSIS — R238 Other skin changes: Secondary | ICD-10-CM

## 2019-01-17 NOTE — Progress Notes (Signed)
Pt was rescheduled to 01-19-19.

## 2019-01-19 ENCOUNTER — Ambulatory Visit: Payer: Medicare Other | Admitting: Family

## 2019-01-29 ENCOUNTER — Telehealth (HOSPITAL_COMMUNITY): Payer: Self-pay

## 2019-01-29 NOTE — Telephone Encounter (Signed)

## 2019-01-30 ENCOUNTER — Ambulatory Visit: Payer: Medicare Other | Admitting: Vascular Surgery

## 2019-02-05 ENCOUNTER — Telehealth (HOSPITAL_COMMUNITY): Payer: Self-pay

## 2019-02-05 NOTE — Telephone Encounter (Signed)

## 2019-02-06 ENCOUNTER — Ambulatory Visit: Payer: Medicare Other | Admitting: Vascular Surgery

## 2019-02-06 ENCOUNTER — Other Ambulatory Visit: Payer: Self-pay

## 2019-02-06 ENCOUNTER — Encounter: Payer: Self-pay | Admitting: Vascular Surgery

## 2019-02-06 VITALS — BP 126/78 | HR 88 | Temp 98.0°F | Resp 20 | Ht 65.0 in | Wt 154.6 lb

## 2019-02-06 DIAGNOSIS — I890 Lymphedema, not elsewhere classified: Secondary | ICD-10-CM | POA: Diagnosis not present

## 2019-02-06 NOTE — Progress Notes (Signed)
[facility] [place]  Patient name: Katie Nunez MRN: [id number] DOB: 21-May-1955 Sex: female  REASON FOR VISIT: Evaluation lower extremity pain and swelling  HPI: Katie Nunez is a 64 y.o. female known to me from prior AV access placement.  She currently has dialysis via a left upper arm fistula and has had excellent use of this.  He presents now with concern regarding swelling and discomfort in her lower extremities mostly from her knees distally into her feet.  She does have multiple diagnoses including fibromyalgia scleroderma and Raynaud's disease.  She reports she has attempted compression garments but does have difficulty tolerating these feeling that they are cutting into her skin.  Past Medical History:  Diagnosis Date  . Achalasia   . Anxiety   . Dysplasia of cervix, low grade (CIN 1)   . Environmental allergies    "all year long" (12/27/2016)  . ESRD (end stage renal disease) on dialysis [facility])    "TTS; [place]" (12/27/2016)  . Fibromyalgia   . GERD (gastroesophageal reflux disease)   . Gout   . Hypertension   . IBS (irritable bowel syndrome)   . MVP (mitral valve prolapse)   . RA (rheumatoid arthritis) (HCC)    FOLLOWED BY DR. SHANAHAN  . Raynaud's disease   . Scleroderma ([place])   . Seasonal allergies   . Thrombocytopenia ([place]) 07/01/2016   Acute fall to 13,000 07/01/16  . Tubular adenoma 01/08/2008   CECUM  . Vitamin D deficiency     Family History  Problem Relation Age of Onset  . Hypertension Mother   . Diabetes Mother   . Heart disease Father   . Hypertension Maternal Aunt   . Diabetes Maternal Grandmother   . Heart disease Paternal Grandfather   . Cerebral palsy Cousin        1ST COUSIN?  . Diabetes Paternal Grandmother     SOCIAL HISTORY: Social History   Tobacco Use  . Smoking status: Never Smoker  . Smokeless tobacco: Never Used  Substance Use Topics  . Alcohol use: No     Allergies  Allergen Reactions  . Savella [Milnacipran Hcl] Palpitations and Other (See Comments)    Unknown  . Tape Rash and Other (See Comments)    Itch- unsure if it was paper or adhesive tape    Current Outpatient Medications  Medication Sig Dispense Refill  . acetaminophen (TYLENOL) 500 MG tablet Take 500-1,000 mg by mouth every 6 (six) hours as needed (for pain/headaches.).     Marland Kitchen albuterol (PROAIR HFA) 108 (90 Base) MCG/ACT inhaler Inhale 2 puffs into the lungs every 6 (six) hours as needed for wheezing or shortness of breath.     Marland Kitchen ambrisentan (LETAIRIS) 5 MG tablet Take by mouth.    . cinacalcet (SENSIPAR) 30 MG tablet Take 30 mg by mouth at bedtime.     . clonazePAM (KLONOPIN) 0.5 MG tablet Take 0.5 mg by mouth 2 (two) times daily as needed for anxiety.     . cyclobenzaprine (FLEXERIL) 5 MG tablet Take 1 tablet (5 mg total) by mouth 3 (three) times daily as needed for muscle spasms. (Patient taking differently: Take 10 mg by mouth 3 (three) times daily as needed for muscle spasms. ) 30 tablet 0  . docusate sodium (COLACE) 100 MG capsule Take 2 capsules (200 mg total) by mouth 2 (two) times daily. (Patient taking differently: Take 200 mg by mouth 2 (two) times daily as needed (for constipation). )  10 capsule 0  . ferric citrate (AURYXIA) 1 GM 210 MG(Fe) tablet Take 420 mg by mouth 3 (three) times daily with meals.     . fluticasone (FLONASE) 50 MCG/ACT nasal spray Place 1 spray into the nose daily.    Marland Kitchen gabapentin (NEURONTIN) 100 MG capsule Take 100 mg by mouth daily as needed (pain).     Marland Kitchen ipratropium (ATROVENT) 0.06 % nasal spray Place 2 sprays into both nostrils 4 (four) times daily.    Marland Kitchen levocetirizine (XYZAL) 5 MG tablet Take 5 mg by mouth every evening.    . loperamide (IMODIUM) 2 MG capsule Take 2-4 mg by mouth 3 (three) times daily as needed for diarrhea or loose stools.    . metoprolol succinate (TOPROL-XL) 25 MG 24 hr tablet Take 12.5 mg by mouth daily.    . montelukast  (SINGULAIR) 10 MG tablet Take 10 mg by mouth daily as needed (allergy symptoms).   1  . multivitamin (RENA-VIT) TABS tablet Take 1 tablet by mouth daily.     . pantoprazole (PROTONIX) 40 MG tablet TAKE 1 TABLET(40 MG) BY MOUTH DAILY 90 tablet 1  . phenylephrine (NEO-SYNEPHRINE) 0.5 % nasal solution Place 1 drop into both nostrils every 6 (six) hours as needed for congestion. 15 mL 1  . Selexipag (UPTRAVI) 200 MCG TABS 600 mg 2 (two) times daily    . Spacer/Aero-Holding Chambers DEVI Use spacer with albuterol 1 each 0  . TADALAFIL PO Take 40 mg by mouth daily.     No current facility-administered medications for this visit.    REVIEW OF SYSTEMS:  [X]  denotes positive finding, [ ]  denotes negative finding Cardiac  Comments:  Chest pain or chest pressure:    Shortness of breath upon exertion:    Short of breath when lying flat:    Irregular heart rhythm:        Vascular    Pain in calf, thigh, or hip brought on by ambulation:    Pain in feet at night that wakes you up from your sleep:     Blood clot in your veins:    Leg swelling:  x         PHYSICAL EXAM: Vitals:   02/06/19 1517  BP: 126/78  Pulse: 88  Resp: 20  Temp: 98 F (36.7 C)  SpO2: 93%  Weight: 154 lb 9.6 oz (70.1 kg)  Height: 5\' 5"  (1.651 m)    GENERAL: The patient is a well-nourished female, in no acute distress. The vital signs are documented above. CARDIOVASCULAR: Palpable radial pulses bilaterally.  Excellent thrill in her left upper arm AV fistula.  I do not palpate pedal pulses.  She does have thickening from her mid calves down onto her ankles and extending onto her feet and onto her toes. PULMONARY: There is good air exchange  MUSCULOSKELETAL: There are no major deformities or cyanosis. NEUROLOGIC: No focal weakness or paresthesias are detected. SKIN: There are no ulcers or rashes noted.  Thickening and leathery changes from her calves down onto her feet and toes PSYCHIATRIC: The patient has a normal  affect.  DATA:  Noninvasive arterial studies from 2 weeks ago were reviewed.  This shows normal arterial waveforms and pressures to her lower extremities bilaterally  MEDICAL ISSUES: Had long discussion with the patient.  Clinically she has changes consistent with lymphedema.  I explained that this is a clinical diagnosis.  Explained that treatments are conservative with elevation and compression and that occasionally lymphedema therapy with a  therapeutic massage with a licensed lymphedema specialist can assist as well.  She wishes to explore this option and we will coordinate this with her.  She will see [place] again on an as-needed basis    Rosetta Posner, MD [facility] Tel [phone] Pager [phone]

## 2019-02-12 ENCOUNTER — Telehealth: Payer: Self-pay | Admitting: Oncology

## 2019-02-12 NOTE — Telephone Encounter (Signed)
Rescheduled per 2/1 sch msg, pt req. Called and spoke with pt, confirmed 2/5 appt

## 2019-02-13 ENCOUNTER — Ambulatory Visit: Payer: Medicare Other | Admitting: Oncology

## 2019-02-16 ENCOUNTER — Ambulatory Visit: Payer: Medicare Other | Admitting: Oncology

## 2019-02-16 ENCOUNTER — Ambulatory Visit: Payer: Medicare Other

## 2019-02-16 ENCOUNTER — Other Ambulatory Visit: Payer: Self-pay

## 2019-02-16 DIAGNOSIS — D6959 Other secondary thrombocytopenia: Secondary | ICD-10-CM

## 2019-02-16 DIAGNOSIS — D693 Immune thrombocytopenic purpura: Secondary | ICD-10-CM

## 2019-02-21 DIAGNOSIS — I509 Heart failure, unspecified: Secondary | ICD-10-CM | POA: Insufficient documentation

## 2019-03-05 ENCOUNTER — Ambulatory Visit: Payer: Medicare Other | Admitting: Occupational Therapy

## 2019-03-13 ENCOUNTER — Telehealth: Payer: Self-pay | Admitting: Oncology

## 2019-03-13 NOTE — Telephone Encounter (Signed)
Returned patient's phone call regarding rescheduling 02/05 appointment, per patient's request appointment has moved to 03/10.

## 2019-03-14 ENCOUNTER — Ambulatory Visit: Payer: Medicare Other | Admitting: Occupational Therapy

## 2019-03-16 ENCOUNTER — Other Ambulatory Visit: Payer: Self-pay

## 2019-03-16 ENCOUNTER — Encounter: Payer: Self-pay | Admitting: Occupational Therapy

## 2019-03-16 ENCOUNTER — Ambulatory Visit: Payer: Medicare Other | Attending: Vascular Surgery | Admitting: Occupational Therapy

## 2019-03-16 DIAGNOSIS — I890 Lymphedema, not elsewhere classified: Secondary | ICD-10-CM | POA: Diagnosis present

## 2019-03-19 ENCOUNTER — Ambulatory Visit: Payer: Medicare Other | Admitting: Podiatry

## 2019-03-19 NOTE — Therapy (Signed)
[place] MAIN [facility] SERVICES [address] [place], [place], [address] Phone: [phone]   Fax:  [phone]  Occupational Therapy Evaluation  Patient Details  Name: Katie Nunez MRN: [id number] Date of Birth: 10/14/1955 Referring Provider (OT): Curt Jews, MD   Encounter Date: 03/16/2019  OT End of Session - 03/19/19 1106    Visit Number  1    Number of Visits  36    Date for OT Re-Evaluation  06/14/19    OT Start Time  0900    OT Stop Time  1015    OT Time Calculation (min)  75 min    Activity Tolerance  Patient tolerated treatment well;No increased pain    Behavior During Therapy  WFL for tasks assessed/performed       Past Medical History:  Diagnosis Date  . Achalasia   . Anxiety   . Dysplasia of cervix, low grade (CIN 1)   . Environmental allergies    "all year long" (12/27/2016)  . ESRD (end stage renal disease) on dialysis [place])    "TTS; [place]" (12/27/2016)  . Fibromyalgia   . GERD (gastroesophageal reflux disease)   . Gout   . Hypertension   . IBS (irritable bowel syndrome)   . MVP (mitral valve prolapse)   . RA (rheumatoid arthritis) (HCC)    FOLLOWED BY DR. SHANAHAN  . Raynaud's disease   . Scleroderma ([place])   . Seasonal allergies   . Thrombocytopenia ([place]) 07/01/2016   Acute fall to 13,000 07/01/16  . Tubular adenoma 01/08/2008   CECUM  . Vitamin D deficiency     Past Surgical History:  Procedure Laterality Date  . ANKLE FRACTURE SURGERY Right   . AV FISTULA PLACEMENT Left 06/28/2016   Procedure: left arm ARTERIOVENOUS (AV) FISTULA CREATION;  Surgeon: Rosetta Posner, MD;  Location: [facility];  Service: Vascular;  Laterality: Left;  . BASCILIC VEIN TRANSPOSITION Left 09/27/2016   Procedure: LEFT UPPER ARM CEPHALIC VEIN TRANSPOSITION;  Surgeon: Rosetta Posner, MD;  Location: [facility];  Service: Vascular;  Laterality: Left;  . BREAST BIOPSY     "? side"  . CESAREAN SECTION  1994  . CO2 LASER OF CERVIX    .  COLONOSCOPY W/ BIOPSIES  01/08/2008  . INSERTION OF DIALYSIS CATHETER Right 06/28/2016   Procedure: INSERTION OF DIALYSIS CATHETER, right internal jugular;  Surgeon: Rosetta Posner, MD;  Location: [place];  Service: Vascular;  Laterality: Right;  . MYOMECTOMY    . PELVIC LAPAROSCOPY  2011  . superficial thrombophlebitis Left 07-2014    There were no vitals filed for this visit.  Subjective Assessment - 03/19/19 1047    Subjective   Katie Nunez is referred to Occupational Therapy by Curt Jews, MD of [facility] for evaluation and treatment of BLE lymphedema. Pt presents with multiple contributing comorbidities. Katie Nunez is referred to Occupational Therapy by Curt Jews, MD of [facility] for evaluation and treatment of BLE lymphedema. Pt presents with multiple contributing comorbidities. She reports positive family hx of limb swelling maternal aunts and maternal grandmother. Pt reports onset of leg swelling about 1 year ago at the same time she started medication regiment for pulmonary hypertension. Pt hasn't tolerated traditional elastic compression stocks well because of poor fit. Pt has not undergone formal lymphedema therapy in the past.    Pertinent History  Scleroderma, RA,HTN, ESRD (dialysis), hx R ankle Fx, 2000,  anxiety,fibromyalgia, gout, HTN, MVP, Raynaud's  disease, C section, ostructive lung disease, B CTS, hereditary and periferal neuropathy, venous reflux    Limitations  impaired functional ambulation and mobility, chronic leg swelling and associated pain, impaired A/PROM, decreased sensation, impaired standing balance, impaired flexibility    Repetition  Increases Symptoms    Special Tests  +Stemmer bilaterally    Patient Stated Goals  reduce swelling, releive associated pain    Currently in Pain?  Yes    Pain Location  Leg    Pain Orientation  Right;Left    Pain Onset  More than a month ago        [facility]  OT Assessment - 03/19/19 0001      Assessment   Medical Diagnosis  stage II BLE lymphedema 2/2 suspected venous disease and inflamatory rheumatological disease ( scleroderma and RA,)    Referring Provider (OT)  Curt Jews, MD    Onset Date/Surgical Date  03/16/19    Hand Dominance  Right      Precautions   Precautions  --   LE skin precautions, LE cardio-pulmonary precautions     Balance Screen   Has the patient fallen in the past 6 months  No    Has the patient had a decrease in activity level because of a fear of falling?   Yes      Home  Environment   Family/patient expects to be discharged to:  Private residence    Living Arrangements  Spouse/significant other    Available Help at Discharge  Family    Type of [place]   2 steps to enter   Home Layout  Two level   lives on 1st floor   Lives With  Spouse      Prior Function   Level of [place]  Retired    Leisure  family      IADL   Prior Level of [place]  Needs to be accompanied on any shopping trip    Prior Level of Function Light Housekeeping  Max A    Light Housekeeping  Needs help with all home maintenance tasks    Prior Level of Function Meal Prep  Mod A    Meal Prep  Able to complete simple cold meal and snack prep;Able to complete simple warm meal prep    Prior Level of Function Community Mobility  no longer drives- Max A    Nurse, children's  Relies on family or friends for transportation      Mobility   Mobility Status  Needs assist      Activity Tolerance   Activity Tolerance  Tolerates 10-20 min activity with multiple rests      Cognition   Overall Cognitive Status  Within Functional Limits for tasks assessed      Observation/Other Assessments   Outcome Measures  Lymphedema Life Impact Scale, Lower Extremity Function       Posture/Postural Control   Posture/Postural Control  No significant limitations       Sensation   Light Touch  Impaired by gross assessment    Additional Comments  periferal neuropathy      Palpation   Palpation comment  dense fibrosis below knees       AROM   Overall AROM   Deficits      PROM   Overall PROM   Deficits  Strength   Overall Strength Comments  ankle strength 5/5      Hand Function   Right Hand Gross Grasp  Impaired    Left Hand Gross Grasp  Impaired                      OT Education - 03/19/19 1105    Education Details  Provided Pt and family education regarding lymphatic structure and function, etiologies, onset patterns and stages of progression. Discussed  impact of obesity on lymphatic function. Outlined Complete Decongestive Therapy (CDT)  as standard of care and provided in depth information regarding 4 primary components of both Intensive and Self Management Phases, including Manual Lymph Drainage (MLD), compression wrapping and garments, skin care, and therapeutic exercise.   Pilar Plate discussion of high burden of care,  Discussed  Importance of daily, ongoing LE self-care essential to retaining clinical gains and limiting progression.  Lastly, reviewed lymphedema precautions, including cellulitis risk and difficulty with wound healing. Provided printed Lymphedema Workbook for reference.    Person(s) Educated  Patient    Methods  Explanation;Demonstration;Handout    Comprehension  Verbalized understanding;Returned demonstration;Need further instruction          OT Long Term Goals - 03/16/19 0956      OT LONG TERM GOAL #1   Title  Pt will be able to verbalize signs and symptoms of cellulitis infection and identify 4 common lymphedema precautions using printed resource for reference (modified independence) with Max assist from spouse to limit LE progression over time.    Baseline  Max A    Time  4    Period  Days    Status  New    Target Date  --   4th OT Rx visit     OT LONG TERM GOAL #2   Title  Pt will be able to apply  modified BLE, knee length, multi-layer, short stretch compression wraps daily to one leg at a time using correct gradient techniques with Max caregiver assistance to achieve optimal limb volume reduction, to return affected limb/s, as closely as possible, to premorbid size and shape, to limit infection risk, and to improve safe functional ambulation and mobility.    Baseline  dependent    Time  4    Period  Days    Status  New    Target Date  --   4th OT Rx visit     OT LONG TERM GOAL #3   Title  Pt to achieve at least 5% RLE limb volume reduction in BLE below the knees, during Intensive Phase CDT to improve safe functional mobility and ambulation, to improve functional performance of basic and instrumental ADLs, to limit infection risk and chronic LE progression.    Baseline  Dependent    Time  12    Period  Weeks    Status  New    Target Date  06/14/19            Plan - 03/19/19 1106    Clinical Impression Statement  Katie Nunez is a 64 year old female presenting with moderate, stage II BLE lymphedema (LE) 2/2 CVI and Scleroderma. Pt presents with contributing comorbidities including CKD on dialysis), HTN and Raynaud's Disease. Limb swelling is concentrated primarily below the knees and extends to toes. Lymphedema related skin changes and associated pain have worsened over time and include dense palpable tissue fibrosis, deep skin creases, dry, flaking skin, hemosiderin staining and positive Stemmer sign bilaterally.  BLE limits Pt's functional abilities in all occupational domains, functional ambulation and transfers, basic and instrumental ADLs, work and productive activities, leisure pursuits and social participation. Lymphedema limits body image and negatively impacts self-esteem. Katie Nunez will benefit from skilled Occupational Therapy for Intensive and Management Phase Complete Decongestive Therapy (CDT) to include manual lymphatic drainage, skin care, therapeutic exercise and  compression therapy to reduce limb swelling and associated pain, to limit progression of the condition, and limit further functional decline. Emphasis throughout the OT course for CDT will also focus on Pt and family education for long term LE self-management. Without skilled OT for CDT LE will progress resulting in progression of the condition, increased infection risk and further functional decline.    OT Occupational Profile and History  Comprehensive Assessment- Review of records and extensive additional review of physical, cognitive, psychosocial history related to current functional performance    Occupational performance deficits (Please refer to evaluation for details):  ADL's;IADL's;Leisure;Rest and Sleep;Social Participation;Other;Work   role performance, Teacher, adult education / Function / Physical Skills  ADL;Endurance;Decreased knowledge of precautions;Decreased knowledge of use of DME;Flexibility;IADL;Pain;Skin integrity;Edema;Mobility;ROM    Rehab Potential  Good    Clinical Decision Making  Multiple treatment options, significant modification of task necessary    Comorbidities Affecting Occupational Performance:  Presence of comorbidities impacting occupational performance    Comorbidities impacting occupational performance description:  multiple inflamatory rheumatological conditions, kidney disease, vascular reflux, , etc... SEE SUBJECTIVE    Modification or Assistance to Complete Evaluation   Min-Moderate modification of tasks or assist with assess necessary to complete eval    OT Frequency  2x / week    OT Duration  12 weeks   and PRN   OT Treatment/Interventions  Self-care/ADL training;Therapeutic exercise;DME and/or AE instruction;Therapist, nutritional;Compression bandaging;Other (comment);Manual Therapy;Scar mobilization;Coping strategies training;Energy conservation;Manual lymph drainage;Passive range of motion;Therapeutic activities   consider light compression    Plan  Complete Decongestive Therapy for LE care to be modified as needed: MLD, skin care, ther ex, compression. Include myofacial release as tolerated PRN    Recommended Other Services  consider trial in clinic with Flexitouch sequential pneumatic compression device, or "pump" . The 32-chamber Flexitouch is the only available sequential pneumatic compression device providing proximal to distal lymphatic fluid return via regional lymph nodes (LN) deep abdominal pathways and the thoracic duct to the heart. The basic pneumatic pump is not appropriate for this patient because it provides distal-to-proximal, retrograde massage mobilizing tissue fluid against back pressure which then abruptly ends before reaching regional LNs leaving dense, protein rich fluid at the joint    Consulted and Agree with Plan of Care  Patient       Patient will benefit from skilled therapeutic intervention in order to improve the following deficits and impairments:   Body Structure / Function / Physical Skills: ADL, Endurance, Decreased knowledge of precautions, Decreased knowledge of use of DME, Flexibility, IADL, Pain, Skin integrity, Edema, Mobility, ROM       Visit Diagnosis: Lymphedema, not elsewhere classified - Plan: Ot plan of care cert/re-cert    Problem List Patient Active Problem List   Diagnosis Date Noted  . Encounter for long-term (current) use of other medications 01/18/2018  . Hypothyroidism 01/18/2018  . Myalgia and myositis 01/18/2018  . Chronic nephritis 01/18/2018  . Other interstitial pulmonary diseases with fibrosis in diseases classified elsewhere 01/18/2018  . Other long term (current) drug therapy 01/18/2018  . Other specified diabetes mellitus without complications ([place]) 52/77/8242  .  Sleep apnea 01/18/2018  . Unspecified persistent mental disorders due to conditions classified elsewhere 01/18/2018  . Venous reflux 01/18/2018  . Vitamin B12 deficiency 01/18/2018  . Vitamin D deficiency  01/18/2018  . Obstructive lung disease ([place]) 12/30/2017  . 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 Chronic cough 11/11/2017  . Pulmonary hypertension ([place]) 11/11/2017  . Pulmonary arterial hypertension ([place]) 10/07/2017  . Acute ITP ([place]) 01/05/2017  . ESRD (end stage renal disease) ([place]) 12/28/2016  . Hemolytic anemia ([place]) 07/26/2016  . Epistaxis, recurrent 07/26/2016  . Thrombocytopenia ([place]) 07/01/2016  . ARF (acute renal failure) ([place]) 06/25/2016  . Anxiety 06/25/2016  . Bilateral carpal tunnel syndrome 10/22/2015  . Chronic gout without tophus 10/22/2015  . Chronic nonintractable headache 10/08/2015  . Fibroid uterus 01/03/2012  . H/O vitamin D deficiency 01/03/2012  . Post-menopausal 01/03/2012  . Hereditary and idiopathic peripheral neuropathy 11/19/2011  . Intestinal malabsorption 11/19/2011  . Lichen planus 51/10/2109  . Low back pain 11/19/2011  . Diffuse spasm of esophagus 11/11/2011  . ESR raised 11/11/2011  . Postinflammatory pulmonary fibrosis ([place]) 11/11/2011  . Scleroderma ([place]) 11/16/2010  . Rheumatoid arthritis ([place]) 11/16/2010  . Raynaud's disease 11/16/2010  . Symptomatic menopausal or female climacteric states 11/16/2010   Andrey Spearman, MS, OTR/L, [facility] 03/19/19 2:58 PM   [place] MAIN [facility] SERVICES [address] [place], [place], [address] Phone: [phone]   Fax:  [phone]  Name: CURRY DULSKI MRN: [id number] Date of Birth: November 09, 1955

## 2019-03-19 NOTE — Patient Instructions (Signed)

## 2019-03-21 ENCOUNTER — Telehealth: Payer: Self-pay | Admitting: Oncology

## 2019-03-21 ENCOUNTER — Ambulatory Visit: Payer: Medicare Other | Admitting: Occupational Therapy

## 2019-03-21 ENCOUNTER — Inpatient Hospital Stay: Payer: Medicare Other | Attending: Oncology | Admitting: Oncology

## 2019-03-21 ENCOUNTER — Other Ambulatory Visit: Payer: Self-pay

## 2019-03-21 ENCOUNTER — Inpatient Hospital Stay: Payer: Medicare Other

## 2019-03-21 VITALS — BP 134/76 | HR 85 | Temp 98.7°F | Resp 18 | Ht 65.0 in | Wt 155.5 lb

## 2019-03-21 DIAGNOSIS — D649 Anemia, unspecified: Secondary | ICD-10-CM | POA: Diagnosis not present

## 2019-03-21 DIAGNOSIS — D693 Immune thrombocytopenic purpura: Secondary | ICD-10-CM | POA: Insufficient documentation

## 2019-03-21 DIAGNOSIS — D6959 Other secondary thrombocytopenia: Secondary | ICD-10-CM

## 2019-03-21 DIAGNOSIS — I890 Lymphedema, not elsewhere classified: Secondary | ICD-10-CM | POA: Diagnosis not present

## 2019-03-21 LAB — CBC WITH DIFFERENTIAL (CANCER CENTER ONLY)
Abs Immature Granulocytes: 0.01 10*3/uL (ref 0.00–0.07)
Basophils Absolute: 0 10*3/uL (ref 0.0–0.1)
Basophils Relative: 1 %
Eosinophils Absolute: 0.1 10*3/uL (ref 0.0–0.5)
Eosinophils Relative: 2 %
HCT: 35.8 % — ABNORMAL LOW (ref 36.0–46.0)
Hemoglobin: 11.2 g/dL — ABNORMAL LOW (ref 12.0–15.0)
Immature Granulocytes: 0 %
Lymphocytes Relative: 23 %
Lymphs Abs: 0.7 10*3/uL (ref 0.7–4.0)
MCH: 30.1 pg (ref 26.0–34.0)
MCHC: 31.3 g/dL (ref 30.0–36.0)
MCV: 96.2 fL (ref 80.0–100.0)
Monocytes Absolute: 0.4 10*3/uL (ref 0.1–1.0)
Monocytes Relative: 14 %
Neutro Abs: 1.8 10*3/uL (ref 1.7–7.7)
Neutrophils Relative %: 60 %
Platelet Count: 124 10*3/uL — ABNORMAL LOW (ref 150–400)
RBC: 3.72 MIL/uL — ABNORMAL LOW (ref 3.87–5.11)
RDW: 15.8 % — ABNORMAL HIGH (ref 11.5–15.5)
WBC Count: 3 10*3/uL — ABNORMAL LOW (ref 4.0–10.5)
nRBC: 0 % (ref 0.0–0.2)

## 2019-03-21 NOTE — Therapy (Signed)
[place] MAIN [facility] SERVICES [address] [place], [place], [address] Phone: [phone]   Fax:  [phone]  Occupational Therapy Treatment  Patient Details  Name: Katie Nunez MRN: [id number] Date of Birth: 04-16-1955 Referring Provider (OT): Curt Jews, MD   Encounter Date: 03/21/2019  OT End of Session - 03/21/19 1216    Visit Number  2    Number of Visits  36    Date for OT Re-Evaluation  06/14/19    OT Start Time  1130    OT Stop Time  1205    OT Time Calculation (min)  35 min    Activity Tolerance  Patient tolerated treatment well;No increased pain    Behavior During Therapy  WFL for tasks assessed/performed       Past Medical History:  Diagnosis Date  . Achalasia   . Anxiety   . Dysplasia of cervix, low grade (CIN 1)   . Environmental allergies    "all year long" (12/27/2016)  . ESRD (end stage renal disease) on dialysis [facility])    "TTS; [place]" (12/27/2016)  . Fibromyalgia   . GERD (gastroesophageal reflux disease)   . Gout   . Hypertension   . IBS (irritable bowel syndrome)   . MVP (mitral valve prolapse)   . RA (rheumatoid arthritis) (HCC)    FOLLOWED BY DR. SHANAHAN  . Raynaud's disease   . Scleroderma ([place])   . Seasonal allergies   . Thrombocytopenia ([place]) 07/01/2016   Acute fall to 13,000 07/01/16  . Tubular adenoma 01/08/2008   CECUM  . Vitamin D deficiency     Past Surgical History:  Procedure Laterality Date  . ANKLE FRACTURE SURGERY Right   . AV FISTULA PLACEMENT Left 06/28/2016   Procedure: left arm ARTERIOVENOUS (AV) FISTULA CREATION;  Surgeon: Rosetta Posner, MD;  Location: [place];  Service: Vascular;  Laterality: Left;  . BASCILIC VEIN TRANSPOSITION Left 09/27/2016   Procedure: LEFT UPPER ARM CEPHALIC VEIN TRANSPOSITION;  Surgeon: Rosetta Posner, MD;  Location: [place];  Service: Vascular;  Laterality: Left;  . BREAST BIOPSY     "? side"  . CESAREAN SECTION  1994  . CO2 LASER OF CERVIX    .  COLONOSCOPY W/ BIOPSIES  01/08/2008  . INSERTION OF DIALYSIS CATHETER Right 06/28/2016   Procedure: INSERTION OF DIALYSIS CATHETER, right internal jugular;  Surgeon: Rosetta Posner, MD;  Location: [place];  Service: Vascular;  Laterality: Right;  . MYOMECTOMY    . PELVIC LAPAROSCOPY  2011  . superficial thrombophlebitis Left 07-2014    There were no vitals filed for this visit.  Subjective Assessment - 03/21/19 1131    Subjective   Katie Nunez presents for OT visit 2/36 for BLE lymphedema care. Pt is accompanied by her spouse, Gralin. Pt is 30 minutes late for a 60 minute OT session.    Pertinent History  Scleroderma, RA,HTN, ESRD (dialysis), hx R ankle Fx, 2000, anxiety,fibromyalgia, gout, HTN, MVP, Raynaud's  disease, C section, ostructive lung disease, B CTS, hereditary and periferal neuropathy, venous reflux    Limitations  impaired functional ambulation and mobility, chronic leg swelling and associated pain, impaired A/PROM, decreased sensation, impaired standing balance, impaired flexibility    Repetition  Increases Symptoms    Special Tests  +Stemmer bilaterally    Patient Stated Goals  reduce swelling, releive associated pain    Pain Onset  More than a month ago  LYMPHEDEMA/ONCOLOGY QUESTIONNAIRE - 03/21/19 1217      Right Lower Extremity Lymphedema   Other  RLE limb volume from ankle to tibial tuberosity (A-D) = 3221.61 ml      Left Lower Extremity Lymphedema   Other  LLE limb volume from ankle to tibial tuberosity (A-D) = 3599.48 ml.    Other  Limb volume differential (LVD) measures 11.73 %, L>R.              OT Treatments/Exercises (OP) - 03/21/19 0001      ADLs   ADL Education Given  Yes      Manual Therapy   Manual Therapy  Edema management;Compression Bandaging;Manual Lymphatic Drainage (MLD)    Manual therapy comments  BLE baseline comparative limb volumetrics.    Compression Bandaging  no time to apply compression wraps as is typical for Rx 1              OT Education - 03/21/19 1215    Education Details  Continued skilled Pt/caregiver education  And LE ADL training throughout visit for lymphedema self care/ home program, including compression wrapping, compression garment and device wear/care, lymphatic pumping ther ex, simple self-MLD, and skin care. Discussed progress towards goals.    Person(s) Educated  Patient;Spouse    Methods  Explanation;Demonstration    Comprehension  Verbalized understanding;Returned demonstration;Need further instruction          OT Long Term Goals - 03/16/19 0956      OT LONG TERM GOAL #1   Title  Pt will be able to verbalize signs and symptoms of cellulitis infection and identify 4 common lymphedema precautions using printed resource for reference (modified independence) with Max assist from spouse to limit LE progression over time.    Baseline  Max A    Time  4    Period  Days    Status  New    Target Date  --   4th OT Rx visit     OT LONG TERM GOAL #2   Title  Pt will be able to apply modified BLE, knee length, multi-layer, short stretch compression wraps daily to one leg at a time using correct gradient techniques with Max caregiver assistance to achieve optimal limb volume reduction, to return affected limb/s, as closely as possible, to premorbid size and shape, to limit infection risk, and to improve safe functional ambulation and mobility.    Baseline  dependent    Time  4    Period  Days    Status  New    Target Date  --   4th OT Rx visit     OT LONG TERM GOAL #3   Title  Pt to achieve at least 5% RLE limb volume reduction in BLE below the knees, during Intensive Phase CDT to improve safe functional mobility and ambulation, to improve functional performance of basic and instrumental ADLs, to limit infection risk and chronic LE progression.    Baseline  Dependent    Time  12    Period  Weeks    Status  New    Target Date  06/14/19            Plan - 03/21/19 1219     Clinical Impression Statement  Completed  initial BLE comparative limb volumetrics. Limb volume differential (LVD) measures 11.73 %, L>R. No gradient compression wraps today due to time constraints . Cont as per POC.    OT Occupational Profile and History  Comprehensive Assessment- Review of  records and extensive additional review of physical, cognitive, psychosocial history related to current functional performance    Occupational performance deficits (Please refer to evaluation for details):  ADL's;IADL's;Leisure;Rest and Sleep;Social Participation;Other;Work   role performance, Teacher, adult education / Function / Physical Skills  ADL;Endurance;Decreased knowledge of precautions;Decreased knowledge of use of DME;Flexibility;IADL;Pain;Skin integrity;Edema;Mobility;ROM    Rehab Potential  Good    Clinical Decision Making  Multiple treatment options, significant modification of task necessary    Comorbidities Affecting Occupational Performance:  Presence of comorbidities impacting occupational performance    Comorbidities impacting occupational performance description:  multiple inflamatory rheumatological conditions, kidney disease, vascular reflux, , etc... SEE SUBJECTIVE    Modification or Assistance to Complete Evaluation   Min-Moderate modification of tasks or assist with assess necessary to complete eval    OT Frequency  2x / week    OT Duration  12 weeks   and PRN   OT Treatment/Interventions  Self-care/ADL training;Therapeutic exercise;DME and/or AE instruction;Therapist, nutritional;Compression bandaging;Other (comment);Manual Therapy;Scar mobilization;Coping strategies training;Energy conservation;Manual lymph drainage;Passive range of motion;Therapeutic activities   consider light compression   Plan  Complete Decongestive Therapy for LE care to be modified as needed: MLD, skin care, ther ex, compression. Include myofacial release as tolerated PRN    Recommended Other Services   consider trial in [place] with Flexitouch sequential pneumatic compression device, or "pump" . The 32-chamber Flexitouch is the only available sequential pneumatic compression device providing proximal to distal lymphatic fluid return via regional lymph nodes (LN) deep abdominal pathways and the thoracic duct to the heart. The basic pneumatic pump is not appropriate for this patient because it provides distal-to-proximal, retrograde massage mobilizing tissue fluid against back pressure which then abruptly ends before reaching regional LNs leaving dense, protein rich fluid at the joint    Consulted and Agree with Plan of Care  Patient       Patient will benefit from skilled therapeutic intervention in order to improve the following deficits and impairments:   Body Structure / Function / Physical Skills: ADL, Endurance, Decreased knowledge of precautions, Decreased knowledge of use of DME, Flexibility, IADL, Pain, Skin integrity, Edema, Mobility, ROM       Visit Diagnosis: Lymphedema, not elsewhere classified    Problem List Patient Active Problem List   Diagnosis Date Noted  . Encounter for long-term (current) use of other medications 01/18/2018  . Hypothyroidism 01/18/2018  . Myalgia and myositis 01/18/2018  . Chronic nephritis 01/18/2018  . Other interstitial pulmonary diseases with fibrosis in diseases classified elsewhere 01/18/2018  . Other long term (current) drug therapy 01/18/2018  . Other specified diabetes mellitus without complications ([place]) 42/59/5638  . Sleep apnea 01/18/2018  . Unspecified persistent mental disorders due to conditions classified elsewhere 01/18/2018  . Venous reflux 01/18/2018  . Vitamin B12 deficiency 01/18/2018  . Vitamin D deficiency 01/18/2018  . Obstructive lung disease ([place]) 12/30/2017  . 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 ([place]) 11/28/2017  . Chronic cough 11/11/2017  . Pulmonary hypertension ([place]) 11/11/2017   . Pulmonary arterial hypertension ([place]) 10/07/2017  . Acute ITP ([place]) 01/05/2017  . ESRD (end stage renal disease) ([place]) 12/28/2016  . Hemolytic anemia ([place]) 07/26/2016  . Epistaxis, recurrent 07/26/2016  . Thrombocytopenia ([place]) 07/01/2016  . ARF (acute renal failure) ([place]) 06/25/2016  . Anxiety 06/25/2016  . Bilateral carpal tunnel syndrome 10/22/2015  . Chronic gout without tophus 10/22/2015  . Chronic nonintractable headache 10/08/2015  . Fibroid uterus 01/03/2012  . H/O vitamin  D deficiency 01/03/2012  . Post-menopausal 01/03/2012  . Hereditary and idiopathic peripheral neuropathy 11/19/2011  . Intestinal malabsorption 11/19/2011  . Lichen planus 86/16/8372  . Low back pain 11/19/2011  . Diffuse spasm of esophagus 11/11/2011  . ESR raised 11/11/2011  . Postinflammatory pulmonary fibrosis ([place]) 11/11/2011  . Scleroderma ([place]) 11/16/2010  . Rheumatoid arthritis ([place]) 11/16/2010  . Raynaud's disease 11/16/2010  . Symptomatic menopausal or female climacteric states 11/16/2010   Katie Spearman, Katie Nunez, Katie Nunez, Katie Nunez 03/21/19 12:22 PM   [place] MAIN [facility] SERVICES [address] [place], [place], [address] Phone: [phone]   Fax:  [phone]  Name: Katie Nunez MRN: [id number] Date of Birth: 04/09/1955

## 2019-03-21 NOTE — Patient Instructions (Signed)

## 2019-03-21 NOTE — Progress Notes (Signed)
Hematology and Oncology Follow Up Visit  Katie Nunez [id number] 1955-04-10 64 y.o. 03/21/2019 9:41 AM Katie Nunez, Katie Nunez, Katie Hope, MD   Principle Diagnosis: 64 year old woman with ITP presented with a platelet count of 31,000 and has been in remission since 2019.    Prior Therapy:  She was treated with corticosteroids followed by rituximab under the care of Dr. Beryle Beams with prednisone taper concluded in May 2019.  She has been in remission since that time.  Current therapy: Active surveillance.  Interim History: Katie Nunez is here for return evaluation.  Since the last visit, she reports no major changes in her health.  She does report of occasional hemorrhoidal bleed no active hematochezia or melena.  She denies any epistaxis or petechiae.  Performance status and quality of life remain excellent.    Medications: Unchanged on review. Current Outpatient Medications  Medication Sig Dispense Refill  . acetaminophen (TYLENOL) 500 MG tablet Take 500-1,000 mg by mouth every 6 (six) hours as needed (for pain/headaches.).     Marland Kitchen albuterol (PROAIR HFA) 108 (90 Base) MCG/ACT inhaler Inhale 2 puffs into the lungs every 6 (six) hours as needed for wheezing or shortness of breath.     Marland Kitchen ambrisentan (LETAIRIS) 5 MG tablet Take by mouth.    . cinacalcet (SENSIPAR) 30 MG tablet Take 30 mg by mouth at bedtime.     . clonazePAM (KLONOPIN) 0.5 MG tablet Take 0.5 mg by mouth 2 (two) times daily as needed for anxiety.     . cyclobenzaprine (FLEXERIL) 5 MG tablet Take 1 tablet (5 mg total) by mouth 3 (three) times daily as needed for muscle spasms. (Patient taking differently: Take 10 mg by mouth 3 (three) times daily as needed for muscle spasms. ) 30 tablet 0  . docusate sodium (COLACE) 100 MG capsule Take 2 capsules (200 mg total) by mouth 2 (two) times daily. (Patient taking differently: Take 200 mg by mouth 2 (two) times daily as needed (for constipation). ) 10 capsule 0  . ferric citrate  (AURYXIA) 1 GM 210 MG(Fe) tablet Take 420 mg by mouth 3 (three) times daily with meals.     . fluticasone (FLONASE) 50 MCG/ACT nasal spray Place 1 spray into the nose daily.    Marland Kitchen gabapentin (NEURONTIN) 100 MG capsule Take 100 mg by mouth daily as needed (pain).     Marland Kitchen ipratropium (ATROVENT) 0.06 % nasal spray Place 2 sprays into both nostrils 4 (four) times daily.    Marland Kitchen levocetirizine (XYZAL) 5 MG tablet Take 5 mg by mouth every evening.    . loperamide (IMODIUM) 2 MG capsule Take 2-4 mg by mouth 3 (three) times daily as needed for diarrhea or loose stools.    . metoprolol succinate (TOPROL-XL) 25 MG 24 hr tablet Take 12.5 mg by mouth daily.    . montelukast (SINGULAIR) 10 MG tablet Take 10 mg by mouth daily as needed (allergy symptoms).   1  . multivitamin (RENA-VIT) TABS tablet Take 1 tablet by mouth daily.     . pantoprazole (PROTONIX) 40 MG tablet TAKE 1 TABLET(40 MG) BY MOUTH DAILY 90 tablet 1  . phenylephrine (NEO-SYNEPHRINE) 0.5 % nasal solution Place 1 drop into both nostrils every 6 (six) hours as needed for congestion. 15 mL 1  . Selexipag (UPTRAVI) 200 MCG TABS 600 mg 2 (two) times daily    . Spacer/Aero-Holding Chambers DEVI Use spacer with albuterol 1 each 0  . TADALAFIL PO Take 40 mg by mouth daily.  No current facility-administered medications for this visit.     Allergies:  Allergies  Allergen Reactions  . Savella [Milnacipran Hcl] Palpitations and Other (See Comments)    Unknown  . Tape Rash and Other (See Comments)    Itch- unsure if it was paper or adhesive tape      Physical Exam: Blood pressure 134/76, pulse 85, temperature 98.7 F (37.1 C), temperature source Temporal, resp. rate 18, height 5\' 5"  (1.651 m), weight 155 lb 8 oz (70.5 kg), last menstrual period 11/15/2008, SpO2 93 %.  ECOG: 1   General appearance: Alert, awake without any distress. Head: Atraumatic without abnormalities Oropharynx: Without any thrush or ulcers. Eyes: No scleral  icterus. Lymph nodes: No lymphadenopathy noted in the cervical, supraclavicular, or axillary nodes Heart:regular rate and rhythm, without any murmurs or gallops.    Bilateral ankle edema noted. Lung: Clear to auscultation without any rhonchi, wheezes or dullness to percussion. Abdomin: Soft, nontender without any shifting dullness or ascites. Musculoskeletal: No clubbing or cyanosis. Neurological: No motor or sensory deficits. Skin: No rashes or lesions.      Lab Results: Lab Results  Component Value Date   WBC 3.1 (L) 08/11/2018   HGB 10.6 (L) 08/11/2018   HCT 32.7 (L) 08/11/2018   MCV 92.4 08/11/2018   PLT 158 08/11/2018     Chemistry      Component Value Date/Time   NA 141 07/27/2018 0946   NA 140 09/26/2017 1048   K 3.2 (L) 07/27/2018 0946   CL 105 07/27/2018 0946   CO2 23 07/27/2018 0946   BUN 33 (H) 07/27/2018 0946   BUN 47 (H) 09/26/2017 1048   CREATININE 7.36 (H) 07/27/2018 0946      Component Value Date/Time   CALCIUM 7.6 (L) 07/27/2018 0946   ALKPHOS 109 07/27/2018 0946   AST 24 07/27/2018 0946   ALT 14 07/27/2018 0946   BILITOT 0.9 07/27/2018 0946   BILITOT 0.3 09/26/2017 1048         Impression and Plan:  64 year old woman with:  1.  Thrombocytopenia diagnosed in February 2019.  This was related to  ITP and currently in remission.   Her CBC from today showed a platelet count of 126,000 with otherwise stable counts.  The natural course of her disease was discussed today.  Potential treatment options if she develops relapse were reviewed which include IVIG, steroids and rituximab.  As long as her platelet count is above 50,000 no bleeding no intervention is needed at this time.   2.  Anemia: Multifactorial in nature related to chronic insufficiency as well as chronic inflammatory conditions.  Hemoglobin is adequate does not require any intervention.   3.  Follow-up: She will return in 6 months for a repeat evaluation.  20  minutes were  dedicated to the encounter.  The time was spent on reviewing her disease status, reviewing laboratory data and answering questions regarding future plan of care.    Zola Button, MD 3/10/20219:41 AM

## 2019-03-21 NOTE — Telephone Encounter (Signed)
Scheduled appt per 3/10 los.  Spoke with pt and she is aware of the appt date and time.

## 2019-03-23 ENCOUNTER — Ambulatory Visit: Payer: Medicare Other | Admitting: Occupational Therapy

## 2019-03-28 ENCOUNTER — Other Ambulatory Visit: Payer: Self-pay

## 2019-03-28 ENCOUNTER — Ambulatory Visit: Payer: Medicare Other | Admitting: Occupational Therapy

## 2019-03-28 DIAGNOSIS — I890 Lymphedema, not elsewhere classified: Secondary | ICD-10-CM | POA: Diagnosis not present

## 2019-03-28 NOTE — Therapy (Signed)
[place] MAIN [facility] SERVICES [address] [place], [place], [address] Phone: [phone]   Fax:  [phone]  Occupational Therapy Treatment  Patient Details  Name: Katie Nunez MRN: [id number] Date of Birth: 12-Jul-1955 Referring Provider (OT): Curt Jews, MD   Encounter Date: 03/28/2019  OT End of Session - 03/28/19 1244    Visit Number  3    Number of Visits  36    Date for OT Re-Evaluation  06/14/19    OT Start Time  1443    OT Stop Time  1210    OT Time Calculation (min)  53 min    Activity Tolerance  Patient tolerated treatment well;No increased pain    Behavior During Therapy  WFL for tasks assessed/performed       Past Medical History:  Diagnosis Date  . Achalasia   . Anxiety   . Dysplasia of cervix, low grade (CIN 1)   . Environmental allergies    "all year long" (12/27/2016)  . ESRD (end stage renal disease) on dialysis [facility])    "TTS; [place]" (12/27/2016)  . Fibromyalgia   . GERD (gastroesophageal reflux disease)   . Gout   . Hypertension   . IBS (irritable bowel syndrome)   . MVP (mitral valve prolapse)   . RA (rheumatoid arthritis) (HCC)    FOLLOWED BY DR. SHANAHAN  . Raynaud's disease   . Scleroderma ([place])   . Seasonal allergies   . Thrombocytopenia ([place]) 07/01/2016   Acute fall to 13,000 07/01/16  . Tubular adenoma 01/08/2008   CECUM  . Vitamin D deficiency     Past Surgical History:  Procedure Laterality Date  . ANKLE FRACTURE SURGERY Right   . AV FISTULA PLACEMENT Left 06/28/2016   Procedure: left arm ARTERIOVENOUS (AV) FISTULA CREATION;  Surgeon: Rosetta Posner, MD;  Location: [place];  Service: Vascular;  Laterality: Left;  . BASCILIC VEIN TRANSPOSITION Left 09/27/2016   Procedure: LEFT UPPER ARM CEPHALIC VEIN TRANSPOSITION;  Surgeon: Rosetta Posner, MD;  Location: [place];  Service: Vascular;  Laterality: Left;  . BREAST BIOPSY     "? side"  . CESAREAN SECTION  1994  . CO2 LASER OF CERVIX    .  COLONOSCOPY W/ BIOPSIES  01/08/2008  . INSERTION OF DIALYSIS CATHETER Right 06/28/2016   Procedure: INSERTION OF DIALYSIS CATHETER, right internal jugular;  Surgeon: Rosetta Posner, MD;  Location: [place];  Service: Vascular;  Laterality: Right;  . MYOMECTOMY    . PELVIC LAPAROSCOPY  2011  . superficial thrombophlebitis Left 07-2014    There were no vitals filed for this visit.  Subjective Assessment - 03/28/19 1232    Subjective   Katie Nunez presents for OT visit 3/36 for BLE lymphedema care. Pt is accompanied by her spouse, Gralin. Pt is 16  minutes late for a 60 minute OT session.Pt has no new complaints today since last visit. She denies lymphedema related pain in her legs. She does report unrated pain due to ingrown L great toe nail.    Patient is accompanied by:  Family member    Pertinent History  Scleroderma, RA,HTN, ESRD (dialysis), hx R ankle Fx, 2000, anxiety,fibromyalgia, gout, HTN, MVP, Raynaud's  disease, C section, ostructive lung disease, B CTS, hereditary and periferal neuropathy, venous reflux    Limitations  impaired functional ambulation and mobility, chronic leg swelling and associated pain, impaired A/PROM, decreased sensation, impaired standing balance, impaired flexibility    Repetition  Increases  Symptoms    Special Tests  +Stemmer bilaterally    Patient Stated Goals  reduce swelling, releive associated pain    Pain Onset  More than a month ago                   OT Treatments/Exercises (OP) - 03/28/19 0001      ADLs   ADL Education Given  Yes      Manual Therapy   Manual Therapy  Edema management    Compression Bandaging  Multilayer gradient compression wraps to LLE below the knee using 8,10 and 12 cm short stretch compression wraps over 0.4 cm Rosidal foam and cotton stockinett.             OT Education - 03/28/19 1238    Education Details  Pt and CG edu for gradient compression wrapping    Person(s) Educated  Patient;Spouse    Methods   Explanation;Demonstration;Verbal cues;Tactile cues    Comprehension  Verbalized understanding;Returned demonstration;Need further instruction;Tactile cues required;Verbal cues required          OT Long Term Goals - 03/16/19 0956      OT LONG TERM GOAL #1   Title  Pt will be able to verbalize signs and symptoms of cellulitis infection and identify 4 common lymphedema precautions using printed resource for reference (modified independence) with Max assist from spouse to limit LE progression over time.    Baseline  Max A    Time  4    Period  Days    Status  New    Target Date  --   4th OT Rx visit     OT LONG TERM GOAL #2   Title  Pt will be able to apply modified BLE, knee length, multi-layer, short stretch compression wraps daily to one leg at a time using correct gradient techniques with Max caregiver assistance to achieve optimal limb volume reduction, to return affected limb/s, as closely as possible, to premorbid size and shape, to limit infection risk, and to improve safe functional ambulation and mobility.    Baseline  dependent    Time  4    Period  Days    Status  New    Target Date  --   4th OT Rx visit     OT LONG TERM GOAL #3   Title  Pt to achieve at least 5% RLE limb volume reduction in BLE below the knees, during Intensive Phase CDT to improve safe functional mobility and ambulation, to improve functional performance of basic and instrumental ADLs, to limit infection risk and chronic LE progression.    Baseline  Dependent    Time  12    Period  Weeks    Status  New    Target Date  06/14/19              Patient will benefit from skilled therapeutic intervention in order to improve the following deficits and impairments:           Visit Diagnosis: Lymphedema, not elsewhere classified    Problem List Patient Active Problem List   Diagnosis Date Noted  . Encounter for long-term (current) use of other medications 01/18/2018  . Hypothyroidism  01/18/2018  . Myalgia and myositis 01/18/2018  . Chronic nephritis 01/18/2018  . Other interstitial pulmonary diseases with fibrosis in diseases classified elsewhere 01/18/2018  . Other long term (current) drug therapy 01/18/2018  . Other specified diabetes mellitus without complications ([place]) 54/09/8117  . Sleep apnea  01/18/2018  . Unspecified persistent mental disorders due to conditions classified elsewhere 01/18/2018  . Venous reflux 01/18/2018  . Vitamin B12 deficiency 01/18/2018  . Vitamin D deficiency 01/18/2018  . Obstructive lung disease ([place]) 12/30/2017  . 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 ([place]) 11/28/2017  . Chronic cough 11/11/2017  . Pulmonary hypertension ([place]) 11/11/2017  . Pulmonary arterial hypertension ([place]) 10/07/2017  . Acute ITP ([place]) 01/05/2017  . ESRD (end stage renal disease) ([place]) 12/28/2016  . Hemolytic anemia ([place]) 07/26/2016  . Epistaxis, recurrent 07/26/2016  . Thrombocytopenia ([place]) 07/01/2016  . ARF (acute renal failure) ([place]) 06/25/2016  . Anxiety 06/25/2016  . Bilateral carpal tunnel syndrome 10/22/2015  . Chronic gout without tophus 10/22/2015  . Chronic nonintractable headache 10/08/2015  . Fibroid uterus 01/03/2012  . H/O vitamin D deficiency 01/03/2012  . Post-menopausal 01/03/2012  . Hereditary and idiopathic peripheral neuropathy 11/19/2011  . Intestinal malabsorption 11/19/2011  . Lichen planus 26/41/5830  . Low back pain 11/19/2011  . Diffuse spasm of esophagus 11/11/2011  . ESR raised 11/11/2011  . Postinflammatory pulmonary fibrosis ([place]) 11/11/2011  . Scleroderma ([place]) 11/16/2010  . Rheumatoid arthritis ([place]) 11/16/2010  . Raynaud's disease 11/16/2010  . Symptomatic menopausal or female climacteric states 11/16/2010    Andrey Spearman, MS, OTR/L, [facility] 03/28/19 12:47 PM   [place] MAIN [facility] SERVICES [address] [place], [place],  [address] Phone: [phone]   Fax:  [phone]  Name: LIYANNA CARTWRIGHT MRN: [id number] Date of Birth: 06-Jul-1955

## 2019-03-30 ENCOUNTER — Other Ambulatory Visit: Payer: Self-pay

## 2019-03-30 ENCOUNTER — Ambulatory Visit: Payer: Medicare Other | Admitting: Occupational Therapy

## 2019-03-30 DIAGNOSIS — I890 Lymphedema, not elsewhere classified: Secondary | ICD-10-CM | POA: Diagnosis not present

## 2019-03-30 NOTE — Therapy (Signed)
[place] MAIN [facility] SERVICES [address] [place], [place], [address] Phone: [phone]   Fax:  [phone]  Occupational Therapy Treatment  Patient Details  Name: Katie Nunez MRN: [id number] Date of Birth: August 25, 1955 Referring Provider (OT): Curt Jews, MD   Encounter Date: 03/30/2019  OT End of Session - 03/30/19 1122    Visit Number  3    Number of Visits  36    Date for OT Re-Evaluation  06/14/19    OT Start Time  1114    OT Stop Time  1220    OT Time Calculation (min)  66 min    Activity Tolerance  Patient tolerated treatment well;No increased pain    Behavior During Therapy  WFL for tasks assessed/performed       Past Medical History:  Diagnosis Date  . Achalasia   . Anxiety   . Dysplasia of cervix, low grade (CIN 1)   . Environmental allergies    "all year long" (12/27/2016)  . ESRD (end stage renal disease) on dialysis [facility])    "TTS; [place]" (12/27/2016)  . Fibromyalgia   . GERD (gastroesophageal reflux disease)   . Gout   . Hypertension   . IBS (irritable bowel syndrome)   . MVP (mitral valve prolapse)   . RA (rheumatoid arthritis) (HCC)    FOLLOWED BY DR. SHANAHAN  . Raynaud's disease   . Scleroderma ([place])   . Seasonal allergies   . Thrombocytopenia ([place]) 07/01/2016   Acute fall to 13,000 07/01/16  . Tubular adenoma 01/08/2008   CECUM  . Vitamin D deficiency     Past Surgical History:  Procedure Laterality Date  . ANKLE FRACTURE SURGERY Right   . AV FISTULA PLACEMENT Left 06/28/2016   Procedure: left arm ARTERIOVENOUS (AV) FISTULA CREATION;  Surgeon: Rosetta Posner, MD;  Location: [place];  Service: Vascular;  Laterality: Left;  . BASCILIC VEIN TRANSPOSITION Left 09/27/2016   Procedure: LEFT UPPER ARM CEPHALIC VEIN TRANSPOSITION;  Surgeon: Rosetta Posner, MD;  Location: [place];  Service: Vascular;  Laterality: Left;  . BREAST BIOPSY     "? side"  . CESAREAN SECTION  1994  . CO2 LASER OF CERVIX    .  COLONOSCOPY W/ BIOPSIES  01/08/2008  . INSERTION OF DIALYSIS CATHETER Right 06/28/2016   Procedure: INSERTION OF DIALYSIS CATHETER, right internal jugular;  Surgeon: Rosetta Posner, MD;  Location: [place];  Service: Vascular;  Laterality: Right;  . MYOMECTOMY    . PELVIC LAPAROSCOPY  2011  . superficial thrombophlebitis Left 07-2014    There were no vitals filed for this visit.  Subjective Assessment - 03/30/19 1122    Subjective   Katie Nunez presents for OT visit 3/36 for BLE lymphedema care. Pt is accompanied by her spouse, Gralin. Pt is 16  minutes late for a 60 minute OT session.Pt has no new complaints today since last visit. She denies lymphedema related pain in her legs. She does report unrated pain due to ingrown L great toe nail.    Patient is accompanied by:  Family member    Pertinent History  Scleroderma, RA,HTN, ESRD (dialysis), hx R ankle Fx, 2000, anxiety,fibromyalgia, gout, HTN, MVP, Raynaud's  disease, C section, ostructive lung disease, B CTS, hereditary and periferal neuropathy, venous reflux    Limitations  impaired functional ambulation and mobility, chronic leg swelling and associated pain, impaired A/PROM, decreased sensation, impaired standing balance, impaired flexibility    Repetition  Increases  Symptoms    Special Tests  +Stemmer bilaterally    Patient Stated Goals  reduce swelling, releive associated pain    Pain Onset  More than a month ago                   OT Treatments/Exercises (OP) - 03/30/19 0001      ADLs   ADL Education Given  Yes      Manual Therapy   Manual Therapy  Edema management;Compression Bandaging;Manual Lymphatic Drainage (MLD);Soft tissue mobilization    Soft tissue mobilization  fibrosis techniques to L foot and ankle    Manual Lymphatic Drainage (MLD)  LLE using short neck, deep abdominals, inguinals and leg sequence. Excelent tolerance.    Compression Bandaging  Multilayer gradient compression wraps to LLE below the knee  using 8,10 and 12 cm short stretch compression wraps over 0.4 cm Rosidal foam and cotton stockinett.             OT Education - 03/30/19 1209    Education Details  Intro level Pt edu for simple self MLD. cont to review compression wrapping.    Person(s) Educated  Patient;Spouse    Methods  Explanation;Demonstration;Verbal cues;Tactile cues    Comprehension  Verbalized understanding;Returned demonstration;Need further instruction;Tactile cues required;Verbal cues required          OT Long Term Goals - 03/16/19 0956      OT LONG TERM GOAL #1   Title  Pt will be able to verbalize signs and symptoms of cellulitis infection and identify 4 common lymphedema precautions using printed resource for reference (modified independence) with Max assist from spouse to limit LE progression over time.    Baseline  Max A    Time  4    Period  Days    Status  New    Target Date  --   4th OT Rx visit     OT LONG TERM GOAL #2   Title  Pt will be able to apply modified BLE, knee length, multi-layer, short stretch compression wraps daily to one leg at a time using correct gradient techniques with Max caregiver assistance to achieve optimal limb volume reduction, to return affected limb/s, as closely as possible, to premorbid size and shape, to limit infection risk, and to improve safe functional ambulation and mobility.    Baseline  dependent    Time  4    Period  Days    Status  New    Target Date  --   4th OT Rx visit     OT LONG TERM GOAL #3   Title  Pt to achieve at least 5% RLE limb volume reduction in BLE below the knees, during Intensive Phase CDT to improve safe functional mobility and ambulation, to improve functional performance of basic and instrumental ADLs, to limit infection risk and chronic LE progression.    Baseline  Dependent    Time  12    Period  Weeks    Status  New    Target Date  06/14/19            Plan - 03/30/19 1210    Clinical Impression Statement   Commenced MLD to LLE using short neck sequence, deep abdominal pathways, functional inguinal LN and proximal to distal leg sequence. Incorporated fibrosis techniques at L foot and ankle with noticvable tissue softening and improved ankle dorsiflexion after treatment. Cont as per POC.    OT Occupational Profile and History  Comprehensive Assessment- Review  of records and extensive additional review of physical, cognitive, psychosocial history related to current functional performance    Occupational performance deficits (Please refer to evaluation for details):  ADL's;IADL's;Leisure;Rest and Sleep;Social Participation;Other;Work   role performance, Teacher, adult education / Function / Physical Skills  ADL;Endurance;Decreased knowledge of precautions;Decreased knowledge of use of DME;Flexibility;IADL;Pain;Skin integrity;Edema;Mobility;ROM    Rehab Potential  Good    Clinical Decision Making  Multiple treatment options, significant modification of task necessary    Comorbidities Affecting Occupational Performance:  Presence of comorbidities impacting occupational performance    Comorbidities impacting occupational performance description:  multiple inflamatory rheumatological conditions, kidney disease, vascular reflux, , etc... SEE SUBJECTIVE    Modification or Assistance to Complete Evaluation   Min-Moderate modification of tasks or assist with assess necessary to complete eval    OT Frequency  2x / week    OT Duration  12 weeks   and PRN   OT Treatment/Interventions  Self-care/ADL training;Therapeutic exercise;DME and/or AE instruction;Therapist, nutritional;Compression bandaging;Other (comment);Manual Therapy;Scar mobilization;Coping strategies training;Energy conservation;Manual lymph drainage;Passive range of motion;Therapeutic activities   consider light compression   Plan  Complete Decongestive Therapy for LE care to be modified as needed: MLD, skin care, ther ex, compression. Include  myofacial release as tolerated PRN    Recommended Other Services  consider trial in clinic with Flexitouch sequential pneumatic compression device, or "pump" . The 32-chamber Flexitouch is the only available sequential pneumatic compression device providing proximal to distal lymphatic fluid return via regional lymph nodes (LN) deep abdominal pathways and the thoracic duct to the heart. The basic pneumatic pump is not appropriate for this patient because it provides distal-to-proximal, retrograde massage mobilizing tissue fluid against back pressure which then abruptly ends before reaching regional LNs leaving dense, protein rich fluid at the joint    Consulted and Agree with Plan of Care  Patient       Patient will benefit from skilled therapeutic intervention in order to improve the following deficits and impairments:   Body Structure / Function / Physical Skills: ADL, Endurance, Decreased knowledge of precautions, Decreased knowledge of use of DME, Flexibility, IADL, Pain, Skin integrity, Edema, Mobility, ROM       Visit Diagnosis: Lymphedema, not elsewhere classified    Problem List Patient Active Problem List   Diagnosis Date Noted  . Encounter for long-term (current) use of other medications 01/18/2018  . Hypothyroidism 01/18/2018  . Myalgia and myositis 01/18/2018  . Chronic nephritis 01/18/2018  . Other interstitial pulmonary diseases with fibrosis in diseases classified elsewhere 01/18/2018  . Other long term (current) drug therapy 01/18/2018  . Other specified diabetes mellitus without complications ([place]) 27/06/2374  . Sleep apnea 01/18/2018  . Unspecified persistent mental disorders due to conditions classified elsewhere 01/18/2018  . Venous reflux 01/18/2018  . Vitamin B12 deficiency 01/18/2018  . Vitamin D deficiency 01/18/2018  . Obstructive lung disease ([place]) 12/30/2017  . 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 ([place]) 11/28/2017  .  Chronic cough 11/11/2017  . Pulmonary hypertension ([place]) 11/11/2017  . Pulmonary arterial hypertension ([place]) 10/07/2017  . Acute ITP ([place]) 01/05/2017  . ESRD (end stage renal disease) ([place]) 12/28/2016  . Hemolytic anemia ([place]) 07/26/2016  . Epistaxis, recurrent 07/26/2016  . Thrombocytopenia ([place]) 07/01/2016  . ARF (acute renal failure) ([place]) 06/25/2016  . Anxiety 06/25/2016  . Bilateral carpal tunnel syndrome 10/22/2015  . Chronic gout without tophus 10/22/2015  . Chronic nonintractable headache 10/08/2015  . Fibroid uterus 01/03/2012  . H/O  vitamin D deficiency 01/03/2012  . Post-menopausal 01/03/2012  . Hereditary and idiopathic peripheral neuropathy 11/19/2011  . Intestinal malabsorption 11/19/2011  . Lichen planus 73/66/8159  . Low back pain 11/19/2011  . Diffuse spasm of esophagus 11/11/2011  . ESR raised 11/11/2011  . Postinflammatory pulmonary fibrosis ([place]) 11/11/2011  . Scleroderma ([place]) 11/16/2010  . Rheumatoid arthritis ([place]) 11/16/2010  . Raynaud's disease 11/16/2010  . Symptomatic menopausal or female climacteric states 11/16/2010    Andrey Spearman, MS, OTR/L, [facility] 03/30/19 12:19 PM  [place] MAIN [facility] SERVICES [address] [place], [place], [address] Phone: [phone]   Fax:  [phone]  Name: Katie Nunez MRN: [id number] Date of Birth: 17-Mar-1955

## 2019-04-02 ENCOUNTER — Encounter: Payer: Self-pay | Admitting: Podiatry

## 2019-04-02 ENCOUNTER — Other Ambulatory Visit: Payer: Self-pay

## 2019-04-02 ENCOUNTER — Other Ambulatory Visit: Payer: Self-pay | Admitting: Podiatry

## 2019-04-02 ENCOUNTER — Ambulatory Visit (INDEPENDENT_AMBULATORY_CARE_PROVIDER_SITE_OTHER): Payer: Medicare Other

## 2019-04-02 ENCOUNTER — Ambulatory Visit: Payer: Medicare Other | Admitting: Podiatry

## 2019-04-02 VITALS — Temp 97.7°F

## 2019-04-02 DIAGNOSIS — M2042 Other hammer toe(s) (acquired), left foot: Secondary | ICD-10-CM

## 2019-04-02 DIAGNOSIS — M79671 Pain in right foot: Secondary | ICD-10-CM

## 2019-04-02 DIAGNOSIS — M2041 Other hammer toe(s) (acquired), right foot: Secondary | ICD-10-CM | POA: Diagnosis not present

## 2019-04-02 DIAGNOSIS — M79672 Pain in left foot: Secondary | ICD-10-CM

## 2019-04-02 DIAGNOSIS — B351 Tinea unguium: Secondary | ICD-10-CM

## 2019-04-02 DIAGNOSIS — L84 Corns and callosities: Secondary | ICD-10-CM | POA: Diagnosis not present

## 2019-04-02 DIAGNOSIS — M79674 Pain in right toe(s): Secondary | ICD-10-CM | POA: Diagnosis not present

## 2019-04-03 NOTE — Progress Notes (Signed)
Subjective:   Patient ID: Katie Nunez, female   DOB: 64 y.o.   MRN: [id number]   HPI Patient presents stating that she is got a lot of painful lesions on both feet and is also concerned about an ingrown toenail on her right big toe and she just wanted to have that checked   ROS      Objective:  Physical Exam  Poor health patient on dialysis with yellow brittle nailbeds 1-5 both feet with the hallux right being incurvated in the medial lateral corner and thick keratotic lesion underneath the first and fifth metatarsal third digit bilateral     Assessment:  Chronic keratotic tissue formation.  With longstanding health compromised individual along with ingrown toenail deformity right hallux     Plan:  H&P reviewed all conditions and at this point debrided out the nail bed to try to take pressure off it and debrided lesions on both feet with no iatrogenic bleeding which will be done periodically.  May have to correct the ingrown toenail if symptoms persist

## 2019-04-04 ENCOUNTER — Ambulatory Visit: Payer: Medicare Other | Admitting: Occupational Therapy

## 2019-04-04 ENCOUNTER — Other Ambulatory Visit: Payer: Self-pay

## 2019-04-04 DIAGNOSIS — I890 Lymphedema, not elsewhere classified: Secondary | ICD-10-CM | POA: Diagnosis not present

## 2019-04-04 NOTE — Therapy (Signed)
[place] MAIN [facility] SERVICES [address] [place], [place], [address] Phone: [phone]   Fax:  [phone]  Occupational Therapy Treatment  Patient Details  Name: Katie Nunez MRN: [id number] Date of Birth: 1955-12-30 Referring Provider (OT): Curt Jews, MD   Encounter Date: 04/04/2019  OT End of Session - 04/04/19 1626    Visit Number  4    Number of Visits  36    Date for OT Re-Evaluation  06/14/19    OT Start Time  1113    OT Stop Time  1220    OT Time Calculation (min)  67 min    Activity Tolerance  Patient tolerated treatment well;No increased pain    Behavior During Therapy  WFL for tasks assessed/performed       Past Medical History:  Diagnosis Date  . Achalasia   . Anxiety   . Dysplasia of cervix, low grade (CIN 1)   . Environmental allergies    "all year long" (12/27/2016)  . ESRD (end stage renal disease) on dialysis [facility])    "TTS; [place]" (12/27/2016)  . Fibromyalgia   . GERD (gastroesophageal reflux disease)   . Gout   . Hypertension   . IBS (irritable bowel syndrome)   . MVP (mitral valve prolapse)   . RA (rheumatoid arthritis) (HCC)    FOLLOWED BY DR. SHANAHAN  . Raynaud's disease   . Scleroderma ([place])   . Seasonal allergies   . Thrombocytopenia ([place]) 07/01/2016   Acute fall to 13,000 07/01/16  . Tubular adenoma 01/08/2008   CECUM  . Vitamin D deficiency     Past Surgical History:  Procedure Laterality Date  . ANKLE FRACTURE SURGERY Right   . AV FISTULA PLACEMENT Left 06/28/2016   Procedure: left arm ARTERIOVENOUS (AV) FISTULA CREATION;  Surgeon: Rosetta Posner, MD;  Location: [place];  Service: Vascular;  Laterality: Left;  . BASCILIC VEIN TRANSPOSITION Left 09/27/2016   Procedure: LEFT UPPER ARM CEPHALIC VEIN TRANSPOSITION;  Surgeon: Rosetta Posner, MD;  Location: [place];  Service: Vascular;  Laterality: Left;  . BREAST BIOPSY     "? side"  . CESAREAN SECTION  1994  . CO2 LASER OF CERVIX    .  COLONOSCOPY W/ BIOPSIES  01/08/2008  . INSERTION OF DIALYSIS CATHETER Right 06/28/2016   Procedure: INSERTION OF DIALYSIS CATHETER, right internal jugular;  Surgeon: Rosetta Posner, MD;  Location: [place];  Service: Vascular;  Laterality: Right;  . MYOMECTOMY    . PELVIC LAPAROSCOPY  2011  . superficial thrombophlebitis Left 07-2014    There were no vitals filed for this visit.  Subjective Assessment - 04/04/19 1622    Subjective   Katie Nunez presents for OT visit 4/36 for BLE lymphedema care. Pt is accompanied by her spouse, Gralin. Pt is 16  minutes late for a 60 minute OT session.Pt states she was skeptical re CDT having niticable effect on limb swelling initially, but now sees limb volume reductionand feels more optimistic.Pt denies leg pain. Ingrown toenail on R great toe is very sore today after MD visit.    Patient is accompanied by:  Family member    Pertinent History  Scleroderma, RA,HTN, ESRD (dialysis), hx R ankle Fx, 2000, anxiety,fibromyalgia, gout, HTN, MVP, Raynaud's  disease, C section, ostructive lung disease, B CTS, hereditary and periferal neuropathy, venous reflux    Limitations  impaired functional ambulation and mobility, chronic leg swelling and associated pain, impaired A/PROM, decreased sensation, impaired  standing balance, impaired flexibility    Repetition  Increases Symptoms    Special Tests  +Stemmer bilaterally    Patient Stated Goals  reduce swelling, releive associated pain    Pain Onset  More than a month ago                   OT Treatments/Exercises (OP) - 04/04/19 0001      ADLs   ADL Education Given  Yes      Manual Therapy   Manual Therapy  Edema management;Manual Lymphatic Drainage (MLD);Compression Bandaging    Manual therapy comments  skin care using skin grade castor oil to improve skin hydration and to mobilize fibrosis to reduce skin tightness.    Soft tissue mobilization  fibrosis techniques to L foot and ankle    Manual  Lymphatic Drainage (MLD)  LLE using short neck, deep abdominals, inguinals and leg sequence. Excelent tolerance.    Compression Bandaging  Multilayer gradient compression wraps to LLE below the knee using 8,10 and 12 cm short stretch compression wraps over 0.4 cm Rosidal foam and cotton stockinett.             OT Education - 04/04/19 1625    Education Details  Intro level Pt and family edu re simple self-MLD. Taught diaphragmatic breathing and explained rational    Person(s) Educated  Patient;Spouse    Methods  Explanation;Demonstration;Verbal cues    Comprehension  Verbalized understanding;Returned demonstration;Need further instruction;Verbal cues required          OT Long Term Goals - 03/16/19 0956      OT LONG TERM GOAL #1   Title  Pt will be able to verbalize signs and symptoms of cellulitis infection and identify 4 common lymphedema precautions using printed resource for reference (modified independence) with Max assist from spouse to limit LE progression over time.    Baseline  Max A    Time  4    Period  Days    Status  New    Target Date  --   4th OT Rx visit     OT LONG TERM GOAL #2   Title  Pt will be able to apply modified BLE, knee length, multi-layer, short stretch compression wraps daily to one leg at a time using correct gradient techniques with Max caregiver assistance to achieve optimal limb volume reduction, to return affected limb/s, as closely as possible, to premorbid size and shape, to limit infection risk, and to improve safe functional ambulation and mobility.    Baseline  dependent    Time  4    Period  Days    Status  New    Target Date  --   4th OT Rx visit     OT LONG TERM GOAL #3   Title  Pt to achieve at least 5% RLE limb volume reduction in BLE below the knees, during Intensive Phase CDT to improve safe functional mobility and ambulation, to improve functional performance of basic and instrumental ADLs, to limit infection risk and chronic LE  progression.    Baseline  Dependent    Time  12    Period  Weeks    Status  New    Target Date  06/14/19            Plan - 04/04/19 1627    Clinical Impression Statement  RLE limb volume is noticably decreased this afternoon. Tissue density around distal leg and ankle is more palpable and very imobile.  Pt tolerated MLD,including fibrosis techniques,  skin care and more intro level edu  for simple self MLD without difficulty. Spouse fully engaged in session. Cont as per POC.    OT Occupational Profile and History  Comprehensive Assessment- Review of records and extensive additional review of physical, cognitive, psychosocial history related to current functional performance    Occupational performance deficits (Please refer to evaluation for details):  ADL's;IADL's;Leisure;Rest and Sleep;Social Participation;Other;Work   role performance, Teacher, adult education / Function / Physical Skills  ADL;Endurance;Decreased knowledge of precautions;Decreased knowledge of use of DME;Flexibility;IADL;Pain;Skin integrity;Edema;Mobility;ROM    Rehab Potential  Good    Clinical Decision Making  Multiple treatment options, significant modification of task necessary    Comorbidities Affecting Occupational Performance:  Presence of comorbidities impacting occupational performance    Comorbidities impacting occupational performance description:  multiple inflamatory rheumatological conditions, kidney disease, vascular reflux, , etc... SEE SUBJECTIVE    Modification or Assistance to Complete Evaluation   Min-Moderate modification of tasks or assist with assess necessary to complete eval    OT Frequency  2x / week    OT Duration  12 weeks   and PRN   OT Treatment/Interventions  Self-care/ADL training;Therapeutic exercise;DME and/or AE instruction;Therapist, nutritional;Compression bandaging;Other (comment);Manual Therapy;Scar mobilization;Coping strategies training;Energy conservation;Manual lymph  drainage;Passive range of motion;Therapeutic activities   consider light compression   Plan  Complete Decongestive Therapy for LE care to be modified as needed: MLD, skin care, ther ex, compression. Include myofacial release as tolerated PRN    Recommended Other Services  consider trial in clinic with Flexitouch sequential pneumatic compression device, or "pump" . The 32-chamber Flexitouch is the only available sequential pneumatic compression device providing proximal to distal lymphatic fluid return via regional lymph nodes (LN) deep abdominal pathways and the thoracic duct to the heart. The basic pneumatic pump is not appropriate for this patient because it provides distal-to-proximal, retrograde massage mobilizing tissue fluid against back pressure which then abruptly ends before reaching regional LNs leaving dense, protein rich fluid at the joint    Consulted and Agree with Plan of Care  Patient       Patient will benefit from skilled therapeutic intervention in order to improve the following deficits and impairments:   Body Structure / Function / Physical Skills: ADL, Endurance, Decreased knowledge of precautions, Decreased knowledge of use of DME, Flexibility, IADL, Pain, Skin integrity, Edema, Mobility, ROM       Visit Diagnosis: Lymphedema, not elsewhere classified    Problem List Patient Active Problem List   Diagnosis Date Noted  . Encounter for long-term (current) use of other medications 01/18/2018  . Hypothyroidism 01/18/2018  . Myalgia and myositis 01/18/2018  . Chronic nephritis 01/18/2018  . Other interstitial pulmonary diseases with fibrosis in diseases classified elsewhere 01/18/2018  . Other long term (current) drug therapy 01/18/2018  . Other specified diabetes mellitus without complications ([place]) 62/94/7654  . Sleep apnea 01/18/2018  . Unspecified persistent mental disorders due to conditions classified elsewhere 01/18/2018  . Venous reflux 01/18/2018  . Vitamin  B12 deficiency 01/18/2018  . Vitamin D deficiency 01/18/2018  . Obstructive lung disease ([place]) 12/30/2017  . 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 Chronic cough 11/11/2017  . Pulmonary hypertension ([place]) 11/11/2017  . Pulmonary arterial hypertension ([place]) 10/07/2017  . Acute ITP ([place]) 01/05/2017  . ESRD (end stage renal disease) ([place]) 12/28/2016  . Hemolytic anemia ([place]) 07/26/2016  . Epistaxis, recurrent 07/26/2016  . Thrombocytopenia ([place])  07/01/2016  . ARF (acute renal failure) ([place]) 06/25/2016  . Anxiety 06/25/2016  . Bilateral carpal tunnel syndrome 10/22/2015  . Chronic gout without tophus 10/22/2015  . Chronic nonintractable headache 10/08/2015  . Fibroid uterus 01/03/2012  . H/O vitamin D deficiency 01/03/2012  . Post-menopausal 01/03/2012  . Hereditary and idiopathic peripheral neuropathy 11/19/2011  . Intestinal malabsorption 11/19/2011  . Lichen planus 18/48/5927  . Low back pain 11/19/2011  . Diffuse spasm of esophagus 11/11/2011  . ESR raised 11/11/2011  . Postinflammatory pulmonary fibrosis ([place]) 11/11/2011  . Scleroderma ([place]) 11/16/2010  . Rheumatoid arthritis ([place]) 11/16/2010  . Raynaud's disease 11/16/2010  . Symptomatic menopausal or female climacteric states 11/16/2010    Andrey Spearman, MS, OTR/L, [facility] 04/04/19 4:30 PM  [place] MAIN [facility] SERVICES [address] [place], [place], [address] Phone: [phone]   Fax:  [phone]  Name: Katie Nunez MRN: [id number] Date of Birth: 13-Apr-1955

## 2019-04-06 ENCOUNTER — Ambulatory Visit: Payer: Medicare Other | Admitting: Occupational Therapy

## 2019-04-06 ENCOUNTER — Other Ambulatory Visit: Payer: Self-pay

## 2019-04-06 DIAGNOSIS — I890 Lymphedema, not elsewhere classified: Secondary | ICD-10-CM | POA: Diagnosis not present

## 2019-04-06 NOTE — Therapy (Signed)
[place] MAIN [facility] SERVICES [address] [place], [place], [address] Phone: [phone]   Fax:  [phone]  Occupational Therapy Treatment  Patient Details  Name: Katie Nunez MRN: [id number] Date of Birth: [birth year] Referring Provider (OT): Curt Jews, MD   Encounter Date: 04/06/2019  OT End of Session - 04/06/19 1222    Visit Number  5    Number of Visits  36    Date for OT Re-Evaluation  06/14/19    OT Start Time  1100    OT Stop Time  1210    OT Time Calculation (min)  70 min    Activity Tolerance  Patient tolerated treatment well;No increased pain    Behavior During Therapy  WFL for tasks assessed/performed       Past Medical History:  Diagnosis Date  . Achalasia   . Anxiety   . Dysplasia of cervix, low grade (CIN 1)   . Environmental allergies    "all year long" (12/27/2016)  . ESRD (end stage renal disease) on dialysis [facility])    "TTS; [place]" (12/27/2016)  . Fibromyalgia   . GERD (gastroesophageal reflux disease)   . Gout   . Hypertension   . IBS (irritable bowel syndrome)   . MVP (mitral valve prolapse)   . RA (rheumatoid arthritis) (HCC)    FOLLOWED BY DR. SHANAHAN  . Raynaud's disease   . Scleroderma ([place])   . Seasonal allergies   . Thrombocytopenia ([place]) 07/01/2016   Acute fall to 13,000 07/01/16  . Tubular adenoma 01/08/2008   CECUM  . Vitamin D deficiency     Past Surgical History:  Procedure Laterality Date  . ANKLE FRACTURE SURGERY Right   . AV FISTULA PLACEMENT Left 06/28/2016   Procedure: left arm ARTERIOVENOUS (AV) FISTULA CREATION;  Surgeon: Rosetta Posner, MD;  Location: [place];  Service: Vascular;  Laterality: Left;  . BASCILIC VEIN TRANSPOSITION Left 09/27/2016   Procedure: LEFT UPPER ARM CEPHALIC VEIN TRANSPOSITION;  Surgeon: Rosetta Posner, MD;  Location: [place];  Service: Vascular;  Laterality: Left;  . BREAST BIOPSY     "? side"  . CESAREAN SECTION  1994  . CO2 LASER OF CERVIX    .  COLONOSCOPY W/ BIOPSIES  01/08/2008  . INSERTION OF DIALYSIS CATHETER Right 06/28/2016   Procedure: INSERTION OF DIALYSIS CATHETER, right internal jugular;  Surgeon: Rosetta Posner, MD;  Location: [place];  Service: Vascular;  Laterality: Right;  . MYOMECTOMY    . PELVIC LAPAROSCOPY  2011  . superficial thrombophlebitis Left 07-2014    There were no vitals filed for this visit.  Subjective Assessment - 04/06/19 1117    Subjective   Katie Nunez presents for OT visit 4/36 for BLE lymphedema care. Pt is accompanied by her spouse, Katie Nunez. Pt is on time this morning. Pt has no new complaints. She continues to have some difficulty tolerating compression wraps due to skin sensativity.    Patient is accompanied by:  Family member    Pertinent History  Scleroderma, RA,HTN, ESRD (dialysis), hx R ankle Fx, 2000, anxiety,fibromyalgia, gout, HTN, MVP, Raynaud's  disease, C section, ostructive lung disease, B CTS, hereditary and periferal neuropathy, venous reflux    Limitations  impaired functional ambulation and mobility, chronic leg swelling and associated pain, impaired A/PROM, decreased sensation, impaired standing balance, impaired flexibility    Repetition  Increases Symptoms    Special Tests  +Stemmer bilaterally    Patient Stated Goals  reduce swelling, releive associated pain    Pain Onset  More than a month ago                   OT Treatments/Exercises (OP) - 04/06/19 0001      Manual Therapy   Manual Therapy  Edema management;Manual Lymphatic Drainage (MLD);Compression Bandaging    Manual therapy comments  skin care using skin grade castor oil to improve skin hydration and to mobilize fibrosis to reduce skin tightness.    Soft tissue mobilization  fibrosis techniques to L foot and ankle    Manual Lymphatic Drainage (MLD)  LLE using short neck, deep abdominals, inguinals and leg sequence. Excelent tolerance.    Compression Bandaging  Multilayer gradient compression wraps to LLE  below the knee using 8,10 and 12 cm short stretch compression wraps over 0.4 cm Rosidal foam and cotton stockinett.             OT Education - 04/06/19 1119    Education Details  Emphasis of skilled LE self-care training today on  simple self-MLD. Pt has mastered J stroke. She completed entire leg sequence with max assist and cues. Technique will improve with practice.    Person(s) Educated  Patient;Spouse    Methods  Explanation;Demonstration;Verbal cues    Comprehension  Verbalized understanding;Returned demonstration;Need further instruction;Verbal cues required          OT Long Term Goals - 03/16/19 0956      OT LONG TERM GOAL #1   Title  Pt will be able to verbalize signs and symptoms of cellulitis infection and identify 4 common lymphedema precautions using printed resource for reference (modified independence) with Max assist from spouse to limit LE progression over time.    Baseline  Max A    Time  4    Period  Days    Status  New    Target Date  --   4th OT Rx visit     OT LONG TERM GOAL #2   Title  Pt will be able to apply modified BLE, knee length, multi-layer, short stretch compression wraps daily to one leg at a time using correct gradient techniques with Max caregiver assistance to achieve optimal limb volume reduction, to return affected limb/s, as closely as possible, to premorbid size and shape, to limit infection risk, and to improve safe functional ambulation and mobility.    Baseline  dependent    Time  4    Period  Days    Status  New    Target Date  --   4th OT Rx visit     OT LONG TERM GOAL #3   Title  Pt to achieve at least 5% RLE limb volume reduction in BLE below the knees, during Intensive Phase CDT to improve safe functional mobility and ambulation, to improve functional performance of basic and instrumental ADLs, to limit infection risk and chronic LE progression.    Baseline  Dependent    Time  12    Period  Weeks    Status  New    Target  Date  06/14/19            Plan - 04/06/19 1222    Clinical Impression Statement  MLD, skin care and compression wraps well tolerated today. Spouse has mastered compression wrapping. Skin flexibility at ankles increases after fibrosis techniques, but re solidified  high proien fluid reaccumulates before next session. Cont POC.    OT Occupational Profile and History  Comprehensive Assessment- Review of records and extensive additional review of physical, cognitive, psychosocial history related to current functional performance    Occupational performance deficits (Please refer to evaluation for details):  ADL's;IADL's;Leisure;Rest and Sleep;Social Participation;Other;Work   role performance, Teacher, adult education / Function / Physical Skills  ADL;Endurance;Decreased knowledge of precautions;Decreased knowledge of use of DME;Flexibility;IADL;Pain;Skin integrity;Edema;Mobility;ROM    Rehab Potential  Good    Clinical Decision Making  Multiple treatment options, significant modification of task necessary    Comorbidities Affecting Occupational Performance:  Presence of comorbidities impacting occupational performance    Comorbidities impacting occupational performance description:  multiple inflamatory rheumatological conditions, kidney disease, vascular reflux, , etc... SEE SUBJECTIVE    Modification or Assistance to Complete Evaluation   Min-Moderate modification of tasks or assist with assess necessary to complete eval    OT Frequency  2x / week    OT Duration  12 weeks   and PRN   OT Treatment/Interventions  Self-care/ADL training;Therapeutic exercise;DME and/or AE instruction;Therapist, nutritional;Compression bandaging;Other (comment);Manual Therapy;Scar mobilization;Coping strategies training;Energy conservation;Manual lymph drainage;Passive range of motion;Therapeutic activities   consider light compression   Plan  Complete Decongestive Therapy for LE care to be modified as  needed: MLD, skin care, ther ex, compression. Include myofacial release as tolerated PRN    Recommended Other Services  consider trial in clinic with Flexitouch sequential pneumatic compression device, or "pump" . The 32-chamber Flexitouch is the only available sequential pneumatic compression device providing proximal to distal lymphatic fluid return via regional lymph nodes (LN) deep abdominal pathways and the thoracic duct to the heart. The basic pneumatic pump is not appropriate for this patient because it provides distal-to-proximal, retrograde massage mobilizing tissue fluid against back pressure which then abruptly ends before reaching regional LNs leaving dense, protein rich fluid at the joint    Consulted and Agree with Plan of Care  Patient       Patient will benefit from skilled therapeutic intervention in order to improve the following deficits and impairments:   Body Structure / Function / Physical Skills: ADL, Endurance, Decreased knowledge of precautions, Decreased knowledge of use of DME, Flexibility, IADL, Pain, Skin integrity, Edema, Mobility, ROM       Visit Diagnosis: Lymphedema, not elsewhere classified    Problem List Patient Active Problem List   Diagnosis Date Noted  . Encounter for long-term (current) use of other medications 01/18/2018  . Hypothyroidism 01/18/2018  . Myalgia and myositis 01/18/2018  . Chronic nephritis 01/18/2018  . Other interstitial pulmonary diseases with fibrosis in diseases classified elsewhere 01/18/2018  . Other long term (current) drug therapy 01/18/2018  . Other specified diabetes mellitus without complications ([place]) 31/54/0086  . Sleep apnea 01/18/2018  . Unspecified persistent mental disorders due to conditions classified elsewhere 01/18/2018  . Venous reflux 01/18/2018  . Vitamin B12 deficiency 01/18/2018  . Vitamin D deficiency 01/18/2018  . Obstructive lung disease ([place]) 12/30/2017  . Pulmonary artery hypertension associated  with connective tissue disease ([place]) 11/28/2017  . Chronic cough 11/11/2017  . Pulmonary hypertension ([place]) 11/11/2017  . Pulmonary arterial hypertension ([place]) 10/07/2017  . Acute ITP ([place]) 01/05/2017  . ESRD (end stage renal disease) ([place]) 12/28/2016  . Hemolytic anemia ([place]) 07/26/2016  . Epistaxis, recurrent 07/26/2016  . Thrombocytopenia ([place]) 07/01/2016  . ARF (acute renal failure) ([place]) 06/25/2016  . Anxiety 06/25/2016  . Bilateral carpal tunnel syndrome 10/22/2015  . Chronic gout without tophus 10/22/2015  . Chronic nonintractable headache 10/08/2015  . Fibroid uterus 01/03/2012  .  H/O vitamin D deficiency 01/03/2012  . Post-menopausal 01/03/2012  . Hereditary and idiopathic peripheral neuropathy 11/19/2011  . Intestinal malabsorption 11/19/2011  . Lichen planus 58/48/3507  . Low back pain 11/19/2011  . Diffuse spasm of esophagus 11/11/2011  . ESR raised 11/11/2011  . Postinflammatory pulmonary fibrosis ([place]) 11/11/2011  . Scleroderma ([place]) 11/16/2010  . Rheumatoid arthritis ([place]) 11/16/2010  . Raynaud's disease 11/16/2010  . Symptomatic menopausal or female climacteric states 11/16/2010    Andrey Spearman, MS, OTR/L, [facility] 04/06/19 12:25 PM  [place] MAIN [facility] SERVICES [address] [place], [place], [address] Phone: [phone]   Fax:  [phone]  Name: Katie Nunez MRN: [id number] Date of Birth: 1955-09-26

## 2019-04-11 ENCOUNTER — Other Ambulatory Visit: Payer: Self-pay

## 2019-04-11 ENCOUNTER — Ambulatory Visit: Payer: Medicare Other | Admitting: Occupational Therapy

## 2019-04-11 DIAGNOSIS — I890 Lymphedema, not elsewhere classified: Secondary | ICD-10-CM | POA: Diagnosis not present

## 2019-04-11 NOTE — Therapy (Signed)
[place] MAIN [facility] SERVICES [address] [place], [place], [address] Phone: [phone]   Fax:  [phone]  Occupational Therapy Treatment  Patient Details  Name: Katie Nunez MRN: [id number] Date of Birth: 03/10/1955 Referring Provider (OT): Curt Jews, MD   Encounter Date: 04/11/2019  OT End of Session - 04/11/19 1242    Visit Number  6    Number of Visits  36    Date for OT Re-Evaluation  06/14/19    OT Start Time  1106    OT Stop Time  1215    OT Time Calculation (min)  69 min    Activity Tolerance  Patient tolerated treatment well;No increased pain       Past Medical History:  Diagnosis Date  . Achalasia   . Anxiety   . Dysplasia of cervix, low grade (CIN 1)   . Environmental allergies    "all year long" (12/27/2016)  . ESRD (end stage renal disease) on dialysis [facility])    "TTS; [place]" (12/27/2016)  . Fibromyalgia   . GERD (gastroesophageal reflux disease)   . Gout   . Hypertension   . IBS (irritable bowel syndrome)   . MVP (mitral valve prolapse)   . RA (rheumatoid arthritis) (HCC)    FOLLOWED BY DR. SHANAHAN  . Raynaud's disease   . Scleroderma ([place])   . Seasonal allergies   . Thrombocytopenia ([place]) 07/01/2016   Acute fall to 13,000 07/01/16  . Tubular adenoma 01/08/2008   CECUM  . Vitamin D deficiency     Past Surgical History:  Procedure Laterality Date  . ANKLE FRACTURE SURGERY Right   . AV FISTULA PLACEMENT Left 06/28/2016   Procedure: left arm ARTERIOVENOUS (AV) FISTULA CREATION;  Surgeon: Rosetta Posner, MD;  Location: [place];  Service: Vascular;  Laterality: Left;  . BASCILIC VEIN TRANSPOSITION Left 09/27/2016   Procedure: LEFT UPPER ARM CEPHALIC VEIN TRANSPOSITION;  Surgeon: Rosetta Posner, MD;  Location: [place];  Service: Vascular;  Laterality: Left;  . BREAST BIOPSY     "? side"  . CESAREAN SECTION  1994  . CO2 LASER OF CERVIX    . COLONOSCOPY W/ BIOPSIES  01/08/2008  . INSERTION OF DIALYSIS  CATHETER Right 06/28/2016   Procedure: INSERTION OF DIALYSIS CATHETER, right internal jugular;  Surgeon: Rosetta Posner, MD;  Location: [place];  Service: Vascular;  Laterality: Right;  . MYOMECTOMY    . PELVIC LAPAROSCOPY  2011  . superficial thrombophlebitis Left 07-2014    There were no vitals filed for this visit.  Subjective Assessment - 04/11/19 1101    Subjective   Katie Nunez presents for OT visit 5//36 for BLE lymphedema care. Pt is accompanied by her spouse, Gralin. Pt is on time this morning. Pt c/o "tyightness" and difficulty bending feet into dorsiflexion causing her to "lose my balance".    Patient is accompanied by:  Family member    Pertinent History  Scleroderma, RA,HTN, ESRD (dialysis), hx R ankle Fx, 2000, anxiety,fibromyalgia, gout, HTN, MVP, Raynaud's  disease, C section, ostructive lung disease, B CTS, hereditary and periferal neuropathy, venous reflux    Limitations  impaired functional ambulation and mobility, chronic leg swelling and associated pain, impaired A/PROM, decreased sensation, impaired standing balance, impaired flexibility    Repetition  Increases Symptoms    Special Tests  +Stemmer bilaterally    Patient Stated Goals  reduce swelling, releive associated pain    Pain Onset  More than a  month ago                   OT Treatments/Exercises (OP) - 04/11/19 0001      ADLs   ADL Education Given  Yes      Manual Therapy   Manual Therapy  Edema management;Soft tissue mobilization;Manual Lymphatic Drainage (MLD);Compression Bandaging    Manual therapy comments  skin care using skin grade castor oil to improve skin hydration and to mobilize fibrosis to reduce skin tightness.    Soft tissue mobilization  fibrosis techniques to L foot and ankle    Manual Lymphatic Drainage (MLD)  LLE using short neck, deep abdominals, inguinals and leg sequence. Excelent tolerance.    Compression Bandaging  Added  sheet of "cigarette foam" to anterior ankle amnd   dorsal foot in effort to create fluid channels in dense , maleable protien righ fibrosis to facilitate improcved distal decongestion and improved tissue flexibility. Multilayer gradient compression wraps to LLE below the knee using 8,10 and 12 cm short stretch compression wraps over 0.4 cm Rosidal foam and cotton stockinett.             OT Education - 04/11/19 1238    Education Details  Continued skilled Pt/caregiver education  And LE ADL training throughout visit for lymphedema self care/ home program, including compression wrapping, compression garment and device wear/care, lymphatic pumping ther ex, simple self-MLD, and skin care. Discussed progress towards goals.    Person(s) Educated  Patient;Spouse    Methods  Explanation;Demonstration;Verbal cues    Comprehension  Verbalized understanding;Returned demonstration;Need further instruction;Verbal cues required          OT Long Term Goals - 03/16/19 0956      OT LONG TERM GOAL #1   Title  Pt will be able to verbalize signs and symptoms of cellulitis infection and identify 4 common lymphedema precautions using printed resource for reference (modified independence) with Max assist from spouse to limit LE progression over time.    Baseline  Max A    Time  4    Period  Days    Status  New    Target Date  --   4th OT Rx visit     OT LONG TERM GOAL #2   Title  Pt will be able to apply modified BLE, knee length, multi-layer, short stretch compression wraps daily to one leg at a time using correct gradient techniques with Max caregiver assistance to achieve optimal limb volume reduction, to return affected limb/s, as closely as possible, to premorbid size and shape, to limit infection risk, and to improve safe functional ambulation and mobility.    Baseline  dependent    Time  4    Period  Days    Status  New    Target Date  --   4th OT Rx visit     OT LONG TERM GOAL #3   Title  Pt to achieve at least 5% RLE limb volume reduction  in BLE below the knees, during Intensive Phase CDT to improve safe functional mobility and ambulation, to improve functional performance of basic and instrumental ADLs, to limit infection risk and chronic LE progression.    Baseline  Dependent    Time  12    Period  Weeks    Status  New    Target Date  06/14/19            Plan - 04/11/19 1243    Clinical Impression Statement  Emphasis  of session on trying to soften deep tissue fibrosis at ankle, dorsal L foot and calf. Pt tolerateated deep tissue techniques without increased pain. At end of session she had improved flexibility and increased dorsiflexion at L ankle.Modified compression wrap by adding cigarette foam spanning dorsal foot    to distal; leg by crossing ankle by adding darts to medial and lateral edges.Modification intended to reduce fibrosis and aid in decongestion to improve akle AROM for safe ambulation. Cont as per POC.    OT Occupational Profile and History  Comprehensive Assessment- Review of records and extensive additional review of physical, cognitive, psychosocial history related to current functional performance    Occupational performance deficits (Please refer to evaluation for details):  ADL's;IADL's;Leisure;Rest and Sleep;Social Participation;Other;Work   role performance, Teacher, adult education / Function / Physical Skills  ADL;Endurance;Decreased knowledge of precautions;Decreased knowledge of use of DME;Flexibility;IADL;Pain;Skin integrity;Edema;Mobility;ROM    Rehab Potential  Good    Clinical Decision Making  Multiple treatment options, significant modification of task necessary    Comorbidities Affecting Occupational Performance:  Presence of comorbidities impacting occupational performance    Comorbidities impacting occupational performance description:  multiple inflamatory rheumatological conditions, kidney disease, vascular reflux, , etc... SEE SUBJECTIVE    Modification or Assistance to Complete  Evaluation   Min-Moderate modification of tasks or assist with assess necessary to complete eval    OT Frequency  2x / week    OT Duration  12 weeks   and PRN   OT Treatment/Interventions  Self-care/ADL training;Therapeutic exercise;DME and/or AE instruction;Therapist, nutritional;Compression bandaging;Other (comment);Manual Therapy;Scar mobilization;Coping strategies training;Energy conservation;Manual lymph drainage;Passive range of motion;Therapeutic activities   consider light compression   Plan  Complete Decongestive Therapy for LE care to be modified as needed: MLD, skin care, ther ex, compression. Include myofacial release as tolerated PRN    Recommended Other Services  consider trial in clinic with Flexitouch sequential pneumatic compression device, or "pump" . The 32-chamber Flexitouch is the only available sequential pneumatic compression device providing proximal to distal lymphatic fluid return via regional lymph nodes (LN) deep abdominal pathways and the thoracic duct to the heart. The basic pneumatic pump is not appropriate for this patient because it provides distal-to-proximal, retrograde massage mobilizing tissue fluid against back pressure which then abruptly ends before reaching regional LNs leaving dense, protein rich fluid at the joint    Consulted and Agree with Plan of Care  Patient       Patient will benefit from skilled therapeutic intervention in order to improve the following deficits and impairments:   Body Structure / Function / Physical Skills: ADL, Endurance, Decreased knowledge of precautions, Decreased knowledge of use of DME, Flexibility, IADL, Pain, Skin integrity, Edema, Mobility, ROM       Visit Diagnosis: Lymphedema, not elsewhere classified    Problem List Patient Active Problem List   Diagnosis Date Noted  . Encounter for long-term (current) use of other medications 01/18/2018  . Hypothyroidism 01/18/2018  . Myalgia and myositis 01/18/2018  .  Chronic nephritis 01/18/2018  . Other interstitial pulmonary diseases with fibrosis in diseases classified elsewhere 01/18/2018  . Other long term (current) drug therapy 01/18/2018  . Other specified diabetes mellitus without complications ([place]) 47/82/9562  . Sleep apnea 01/18/2018  . Unspecified persistent mental disorders due to conditions classified elsewhere 01/18/2018  . Venous reflux 01/18/2018  . Vitamin B12 deficiency 01/18/2018  . Vitamin D deficiency 01/18/2018  . Obstructive lung disease ([place]) 12/30/2017  . 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 Chronic cough 11/11/2017  . Pulmonary hypertension ([place]) 11/11/2017  . Pulmonary arterial hypertension ([place]) 10/07/2017  . Acute ITP ([place]) 01/05/2017  . ESRD (end stage renal disease) ([place]) 12/28/2016  . Hemolytic anemia ([place]) 07/26/2016  . Epistaxis, recurrent 07/26/2016  . Thrombocytopenia ([place]) 07/01/2016  . ARF (acute renal failure) ([place]) 06/25/2016  . Anxiety 06/25/2016  . Bilateral carpal tunnel syndrome 10/22/2015  . Chronic gout without tophus 10/22/2015  . Chronic nonintractable headache 10/08/2015  . Fibroid uterus 01/03/2012  . H/O vitamin D deficiency 01/03/2012  . Post-menopausal 01/03/2012  . Hereditary and idiopathic peripheral neuropathy 11/19/2011  . Intestinal malabsorption 11/19/2011  . Lichen planus 51/98/2429  . Low back pain 11/19/2011  . Diffuse spasm of esophagus 11/11/2011  . ESR raised 11/11/2011  . Postinflammatory pulmonary fibrosis ([place]) 11/11/2011  . Scleroderma ([place]) 11/16/2010  . Rheumatoid arthritis ([place]) 11/16/2010  . Raynaud's disease 11/16/2010  . Symptomatic menopausal or female climacteric states 11/16/2010    Andrey Spearman, MS, OTR/L, [facility] 04/11/19 12:51 PM  [place] MAIN [facility] SERVICES [address] [place], [place], [address] Phone: [phone]   Fax:  [phone]  Name: JYA HUGHSTON MRN: [id number] Date of Birth: 02-04-1955

## 2019-04-13 ENCOUNTER — Other Ambulatory Visit: Payer: Self-pay

## 2019-04-13 ENCOUNTER — Ambulatory Visit: Payer: Medicare Other | Attending: Vascular Surgery | Admitting: Occupational Therapy

## 2019-04-13 DIAGNOSIS — I890 Lymphedema, not elsewhere classified: Secondary | ICD-10-CM | POA: Diagnosis not present

## 2019-04-13 NOTE — Therapy (Signed)
[place] MAIN [facility] SERVICES [address] [place], [place], [address] Phone: [phone]   Fax:  [phone]  Occupational Therapy Treatment  Patient Details  Name: LATIVIA VELIE MRN: [id number] Date of Birth: [birth year] Referring Provider (OT): Curt Jews, MD   Encounter Date: 04/13/2019  OT End of Session - 04/13/19 1040    Visit Number  7    Number of Visits  36    Date for OT Re-Evaluation  06/14/19    OT Start Time  1115    OT Stop Time  1209    OT Time Calculation (min)  54 min    Activity Tolerance  Patient tolerated treatment well;No increased pain       Past Medical History:  Diagnosis Date  . Achalasia   . Anxiety   . Dysplasia of cervix, low grade (CIN 1)   . Environmental allergies    "all year long" (12/27/2016)  . ESRD (end stage renal disease) on dialysis [facility])    "TTS; [place]" (12/27/2016)  . Fibromyalgia   . GERD (gastroesophageal reflux disease)   . Gout   . Hypertension   . IBS (irritable bowel syndrome)   . MVP (mitral valve prolapse)   . RA (rheumatoid arthritis) (HCC)    FOLLOWED BY DR. SHANAHAN  . Raynaud's disease   . Scleroderma ([place])   . Seasonal allergies   . Thrombocytopenia ([place]) 07/01/2016   Acute fall to 13,000 07/01/16  . Tubular adenoma 01/08/2008   CECUM  . Vitamin D deficiency     Past Surgical History:  Procedure Laterality Date  . ANKLE FRACTURE SURGERY Right   . AV FISTULA PLACEMENT Left 06/28/2016   Procedure: left arm ARTERIOVENOUS (AV) FISTULA CREATION;  Surgeon: Rosetta Posner, MD;  Location: [place];  Service: Vascular;  Laterality: Left;  . BASCILIC VEIN TRANSPOSITION Left 09/27/2016   Procedure: LEFT UPPER ARM CEPHALIC VEIN TRANSPOSITION;  Surgeon: Rosetta Posner, MD;  Location: [place];  Service: Vascular;  Laterality: Left;  . BREAST BIOPSY     "? side"  . CESAREAN SECTION  1994  . CO2 LASER OF CERVIX    . COLONOSCOPY W/ BIOPSIES  01/08/2008  . INSERTION OF DIALYSIS  CATHETER Right 06/28/2016   Procedure: INSERTION OF DIALYSIS CATHETER, right internal jugular;  Surgeon: Rosetta Posner, MD;  Location: [place];  Service: Vascular;  Laterality: Right;  . MYOMECTOMY    . PELVIC LAPAROSCOPY  2011  . superficial thrombophlebitis Left 07-2014    There were no vitals filed for this visit.  Subjective Assessment - 04/13/19 1038    Subjective   [name] presents for OT visit 7//36 for BLE lymphedema care. Pt is accompanied by her spouse, Gralin. Pt  reports hypersensativity of skin limits tolerance of compression wraps all night long. Pt reports she has to take wraps off to be able to sleep.    Patient is accompanied by:  Family member    Pertinent History  Scleroderma, RA,HTN, ESRD (dialysis), hx R ankle Fx, 2000, anxiety,fibromyalgia, gout, HTN, MVP, Raynaud's  disease, C section, ostructive lung disease, B CTS, hereditary and periferal neuropathy, venous reflux    Limitations  impaired functional ambulation and mobility, chronic leg swelling and associated pain, impaired A/PROM, decreased sensation, impaired standing balance, impaired flexibility    Repetition  Increases Symptoms    Special Tests  +Stemmer bilaterally    Patient Stated Goals  reduce swelling, releive associated pain  Pain Onset  More than a month ago                   OT Treatments/Exercises (OP) - 04/13/19 0001      ADLs   ADL Education Given  Yes      Manual Therapy   Manual Therapy  Edema management;Soft tissue mobilization;Compression Bandaging;Manual Lymphatic Drainage (MLD)    Manual therapy comments  skin care using skin grade castor oil to improve skin hydration and to mobilize fibrosis to reduce skin tightness.    Soft tissue mobilization  fibrosis techniques to L foot and ankle    Manual Lymphatic Drainage (MLD)  RLE using short neck, deep abdominals, inguinals and leg sequence. Excelent tolerance.    Compression Bandaging  Compression wrap to RLE today                        with slightly less stretch on Rosidal foam  and 2 instead of 3 wraps (med and large only) in effort to increase tolerance. No compression to LLE today as Pt cut foot removing callouse.             OT Education - 04/13/19 1039    Education Details  Continued skilled Pt/caregiver education  And LE ADL training throughout visit for lymphedema self care/ home program, including compression wrapping, compression garment and device wear/care, lymphatic pumping ther ex, simple self-MLD, and skin care. Discussed progress towards goals.    Person(s) Educated  Patient;Spouse    Methods  Explanation;Demonstration;Verbal cues    Comprehension  Verbalized understanding;Returned demonstration;Need further instruction;Verbal cues required          OT Long Term Goals - 03/16/19 0956      OT LONG TERM GOAL #1   Title  Pt will be able to verbalize signs and symptoms of cellulitis infection and identify 4 common lymphedema precautions using printed resource for reference (modified independence) with Max assist from spouse to limit LE progression over time.    Baseline  Max A    Time  4    Period  Days    Status  New    Target Date  --   4th OT Rx visit     OT LONG TERM GOAL #2   Title  Pt will be able to apply modified BLE, knee length, multi-layer, short stretch compression wraps daily to one leg at a time using correct gradient techniques with Max caregiver assistance to achieve optimal limb volume reduction, to return affected limb/s, as closely as possible, to premorbid size and shape, to limit infection risk, and to improve safe functional ambulation and mobility.    Baseline  dependent    Time  4    Period  Days    Status  New    Target Date  --   4th OT Rx visit     OT LONG TERM GOAL #3   Title  Pt to achieve at least 5% RLE limb volume reduction in BLE below the knees, during Intensive Phase CDT to improve safe functional mobility and ambulation, to improve functional  performance of basic and instrumental ADLs, to limit infection risk and chronic LE progression.    Baseline  Dependent    Time  12    Period  Weeks    Status  New    Target Date  06/14/19            Plan - 04/13/19 1214  Clinical Impression Statement  Pt tolerated RLE manual therapy, including fibrosis techniques and modified compression wraps without increased pain or hypersensativity today. Limb volume reduction ins slight but visible bilaterally. Increased ankle AROM bilaterally after fibrosis techniques. Cont as per POC.    OT Occupational Profile and History  Comprehensive Assessment- Review of records and extensive additional review of physical, cognitive, psychosocial history related to current functional performance    Occupational performance deficits (Please refer to evaluation for details):  ADL's;IADL's;Leisure;Rest and Sleep;Social Participation;Other;Work   role performance, Teacher, adult education / Function / Physical Skills  ADL;Endurance;Decreased knowledge of precautions;Decreased knowledge of use of DME;Flexibility;IADL;Pain;Skin integrity;Edema;Mobility;ROM    Rehab Potential  Good    Clinical Decision Making  Multiple treatment options, significant modification of task necessary    Comorbidities Affecting Occupational Performance:  Presence of comorbidities impacting occupational performance    Comorbidities impacting occupational performance description:  multiple inflamatory rheumatological conditions, kidney disease, vascular reflux, , etc... SEE SUBJECTIVE    Modification or Assistance to Complete Evaluation   Min-Moderate modification of tasks or assist with assess necessary to complete eval    OT Frequency  2x / week    OT Duration  12 weeks   and PRN   OT Treatment/Interventions  Self-care/ADL training;Therapeutic exercise;DME and/or AE instruction;Therapist, nutritional;Compression bandaging;Other (comment);Manual Therapy;Scar mobilization;Coping  strategies training;Energy conservation;Manual lymph drainage;Passive range of motion;Therapeutic activities   consider light compression   Plan  Complete Decongestive Therapy for LE care to be modified as needed: MLD, skin care, ther ex, compression. Include myofacial release as tolerated PRN    Recommended Other Services  consider trial in clinic with Flexitouch sequential pneumatic compression device, or "pump" . The 32-chamber Flexitouch is the only available sequential pneumatic compression device providing proximal to distal lymphatic fluid return via regional lymph nodes (LN) deep abdominal pathways and the thoracic duct to the heart. The basic pneumatic pump is not appropriate for this patient because it provides distal-to-proximal, retrograde massage mobilizing tissue fluid against back pressure which then abruptly ends before reaching regional LNs leaving dense, protein rich fluid at the joint    Consulted and Agree with Plan of Care  Patient       Patient will benefit from skilled therapeutic intervention in order to improve the following deficits and impairments:   Body Structure / Function / Physical Skills: ADL, Endurance, Decreased knowledge of precautions, Decreased knowledge of use of DME, Flexibility, IADL, Pain, Skin integrity, Edema, Mobility, ROM       Visit Diagnosis: Lymphedema, not elsewhere classified    Problem List Patient Active Problem List   Diagnosis Date Noted  . Encounter for long-term (current) use of other medications 01/18/2018  . Hypothyroidism 01/18/2018  . Myalgia and myositis 01/18/2018  . Chronic nephritis 01/18/2018  . Other interstitial pulmonary diseases with fibrosis in diseases classified elsewhere 01/18/2018  . Other long term (current) drug therapy 01/18/2018  . Other specified diabetes mellitus without complications ([place]) 42/70/6237  . Sleep apnea 01/18/2018  . Unspecified persistent mental disorders due to conditions classified elsewhere  01/18/2018  . Venous reflux 01/18/2018  . Vitamin B12 deficiency 01/18/2018  . Vitamin D deficiency 01/18/2018  . Obstructive lung disease ([place]) 12/30/2017  . 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 ([place]) 11/28/2017  . Chronic cough 11/11/2017  . Pulmonary hypertension ([place]) 11/11/2017  . Pulmonary arterial hypertension ([place]) 10/07/2017  . Acute ITP ([place]) 01/05/2017  . ESRD (end stage renal disease) ([place]) 12/28/2016  . Hemolytic  anemia ([place]) 07/26/2016  . Epistaxis, recurrent 07/26/2016  . Thrombocytopenia ([place]) 07/01/2016  . ARF (acute renal failure) ([place]) 06/25/2016  . Anxiety 06/25/2016  . Bilateral carpal tunnel syndrome 10/22/2015  . Chronic gout without tophus 10/22/2015  . Chronic nonintractable headache 10/08/2015  . Fibroid uterus 01/03/2012  . H/O vitamin D deficiency 01/03/2012  . Post-menopausal 01/03/2012  . Hereditary and idiopathic peripheral neuropathy 11/19/2011  . Intestinal malabsorption 11/19/2011  . Lichen planus 08/24/4816  . Low back pain 11/19/2011  . Diffuse spasm of esophagus 11/11/2011  . ESR raised 11/11/2011  . Postinflammatory pulmonary fibrosis ([place]) 11/11/2011  . Scleroderma ([place]) 11/16/2010  . Rheumatoid arthritis ([place]) 11/16/2010  . Raynaud's disease 11/16/2010  . Symptomatic menopausal or female climacteric states 11/16/2010    Andrey Spearman, MS, OTR/L, [facility] 04/13/19 12:16 PM  [place] MAIN [facility] SERVICES [address] [place], [place], [address] Phone: [phone]   Fax:  [phone]  Name: ANAPAULA SEVERT MRN: [id number] Date of Birth: 1955-04-21

## 2019-04-18 ENCOUNTER — Ambulatory Visit: Payer: Medicare Other | Admitting: Occupational Therapy

## 2019-04-20 ENCOUNTER — Ambulatory Visit: Payer: Medicare Other | Admitting: Occupational Therapy

## 2019-04-20 ENCOUNTER — Other Ambulatory Visit: Payer: Self-pay

## 2019-04-20 DIAGNOSIS — I890 Lymphedema, not elsewhere classified: Secondary | ICD-10-CM | POA: Diagnosis not present

## 2019-04-20 NOTE — Therapy (Signed)
[place] MAIN [facility] SERVICES [address] [place], [place], [address] Phone: [phone]   Fax:  [phone]  Occupational Therapy Treatment  Patient Details  Name: Katie Nunez MRN: [id number] Date of Birth: Jun 02, 1955 Referring Provider (OT): Curt Jews, MD   Encounter Date: 04/20/2019  OT End of Session - 04/20/19 1036    Visit Number  8    Number of Visits  36    Date for OT Re-Evaluation  06/14/19    OT Start Time  1120    OT Stop Time  1210    OT Time Calculation (min)  50 min    Activity Tolerance  Patient tolerated treatment well;No increased pain       Past Medical History:  Diagnosis Date  . Achalasia   . Anxiety   . Dysplasia of cervix, low grade (CIN 1)   . Environmental allergies    "all year long" (12/27/2016)  . ESRD (end stage renal disease) on dialysis [facility])    "TTS; [place]" (12/27/2016)  . Fibromyalgia   . GERD (gastroesophageal reflux disease)   . Gout   . Hypertension   . IBS (irritable bowel syndrome)   . MVP (mitral valve prolapse)   . RA (rheumatoid arthritis) (HCC)    FOLLOWED BY DR. SHANAHAN  . Raynaud's disease   . Scleroderma ([place])   . Seasonal allergies   . Thrombocytopenia ([place]) 07/01/2016   Acute fall to 13,000 07/01/16  . Tubular adenoma 01/08/2008   CECUM  . Vitamin D deficiency     Past Surgical History:  Procedure Laterality Date  . ANKLE FRACTURE SURGERY Right   . AV FISTULA PLACEMENT Left 06/28/2016   Procedure: left arm ARTERIOVENOUS (AV) FISTULA CREATION;  Surgeon: Rosetta Posner, MD;  Location: [place];  Service: Vascular;  Laterality: Left;  . BASCILIC VEIN TRANSPOSITION Left 09/27/2016   Procedure: LEFT UPPER ARM CEPHALIC VEIN TRANSPOSITION;  Surgeon: Rosetta Posner, MD;  Location: [place];  Service: Vascular;  Laterality: Left;  . BREAST BIOPSY     "? side"  . CESAREAN SECTION  1994  . CO2 LASER OF CERVIX    . COLONOSCOPY W/ BIOPSIES  01/08/2008  . INSERTION OF DIALYSIS  CATHETER Right 06/28/2016   Procedure: INSERTION OF DIALYSIS CATHETER, right internal jugular;  Surgeon: Rosetta Posner, MD;  Location: [place];  Service: Vascular;  Laterality: Right;  . MYOMECTOMY    . PELVIC LAPAROSCOPY  2011  . superficial thrombophlebitis Left 07-2014    There were no vitals filed for this visit.  Subjective Assessment - 04/20/19 1128    Subjective   Katie Nunez presents for OT visit 8//36 for BLE lymphedema care. Pt is accompanied by her spouse, Gralin. Pt  reports she had dramatic increase in swelling above the knee after wraps were applied to LLE below the knee. Pt expresses concern.    Patient is accompanied by:  Family member    Pertinent History  Scleroderma, RA,HTN, ESRD (dialysis), hx R ankle Fx, 2000, anxiety,fibromyalgia, gout, HTN, MVP, Raynaud's  disease, C section, ostructive lung disease, B CTS, hereditary and periferal neuropathy, venous reflux    Limitations  impaired functional ambulation and mobility, chronic leg swelling and associated pain, impaired A/PROM, decreased sensation, impaired standing balance, impaired flexibility    Repetition  Increases Symptoms    Special Tests  +Stemmer bilaterally    Patient Stated Goals  reduce swelling, releive associated pain    Pain Onset  More than a month ago                   OT Treatments/Exercises (OP) - 04/20/19 0001      ADLs   ADL Education Given  Yes      Manual Therapy   Manual Therapy  Edema management;Soft tissue mobilization;Compression Bandaging;Manual Lymphatic Drainage (MLD)    Manual therapy comments  skin care using skin grade castor oil to improve skin hydration and to mobilize fibrosis to reduce skin tightness.    Soft tissue mobilization  fibrosis techniques to L foot and ankle    Manual Lymphatic Drainage (MLD)  RLE using short neck, deep abdominals, inguinals and leg sequence. Excelent tolerance.    Compression Bandaging  Modified compression wrap today by adding  convoluted "cigarette foam" pad to dorsal foot at base of toes under bandages, and  "dot foam" kidney-shaped pads at medial and ,lateral malleolli in effort to reduce woody fibrosis in these regions for increased AROM. Applied Knee-length RLE wrap as established. after MLD.             OT Education - 04/20/19 1042    Education Details  Continued skilled Pt/caregiver education  And LE ADL training throughout visit for lymphedema self care/ home program, including compression wrapping, compression garment and device wear/care, lymphatic pumping ther ex, simple self-MLD, and skin care. Discussed progress towards goals.    Person(s) Educated  Patient;Spouse    Methods  Explanation;Demonstration;Verbal cues    Comprehension  Verbalized understanding;Returned demonstration;Need further instruction;Verbal cues required          OT Long Term Goals - 03/16/19 0956      OT LONG TERM GOAL #1   Title  Pt will be able to verbalize signs and symptoms of cellulitis infection and identify 4 common lymphedema precautions using printed resource for reference (modified independence) with Max assist from spouse to limit LE progression over time.    Baseline  Max A    Time  4    Period  Days    Status  New    Target Date  --   4th OT Rx visit     OT LONG TERM GOAL #2   Title  Pt will be able to apply modified BLE, knee length, multi-layer, short stretch compression wraps daily to one leg at a time using correct gradient techniques with Max caregiver assistance to achieve optimal limb volume reduction, to return affected limb/s, as closely as possible, to premorbid size and shape, to limit infection risk, and to improve safe functional ambulation and mobility.    Baseline  dependent    Time  4    Period  Days    Status  New    Target Date  --   4th OT Rx visit     OT LONG TERM GOAL #3   Title  Pt to achieve at least 5% RLE limb volume reduction in BLE below the knees, during Intensive Phase CDT  to improve safe functional mobility and ambulation, to improve functional performance of basic and instrumental ADLs, to limit infection risk and chronic LE progression.    Baseline  Dependent    Time  12    Period  Weeks    Status  New    Target Date  06/14/19            Plan - 04/20/19 1227    Clinical Impression Statement  Pt tolerated RLE manual therapy, including fibrosis techniques and   modified compression wraps without increased pain or hypersensativity today. Limb volume reduction ins slight but visible bilaterally. Increased ankle AROM bilaterally after fibrosis techniques. Cont as per POC.    OT Occupational Profile and History  Comprehensive Assessment- Review of records and extensive additional review of physical, cognitive, psychosocial history related to current functional performance    Occupational performance deficits (Please refer to evaluation for details):  ADL's;IADL's;Leisure;Rest and Sleep;Social Participation;Other;Work   role performance, body image   Body Structure / Function / Physical Skills  ADL;Endurance;Decreased knowledge of precautions;Decreased knowledge of use of DME;Flexibility;IADL;Pain;Skin integrity;Edema;Mobility;ROM    Rehab Potential  Good    Clinical Decision Making  Multiple treatment options, significant modification of task necessary    Comorbidities Affecting Occupational Performance:  Presence of comorbidities impacting occupational performance    Comorbidities impacting occupational performance description:  multiple inflamatory rheumatological conditions, kidney disease, vascular reflux, , etc... SEE SUBJECTIVE    Modification or Assistance to Complete Evaluation   Min-Moderate modification of tasks or assist with assess necessary to complete eval    OT Frequency  2x / week    OT Duration  12 weeks   and PRN   OT Treatment/Interventions  Self-care/ADL training;Therapeutic exercise;DME and/or AE instruction;Functional Mobility  Training;Compression bandaging;Other (comment);Manual Therapy;Scar mobilization;Coping strategies training;Energy conservation;Manual lymph drainage;Passive range of motion;Therapeutic activities   consider light compression   Plan  Complete Decongestive Therapy for LE care to be modified as needed: MLD, skin care, ther ex, compression. Include myofacial release as tolerated PRN    Recommended Other Services  consider trial in clinic with Flexitouch sequential pneumatic compression device, or "pump" . The 32-chamber Flexitouch is the only available sequential pneumatic compression device providing proximal to distal lymphatic fluid return via regional lymph nodes (LN) deep abdominal pathways and the thoracic duct to the heart. The basic pneumatic pump is not appropriate for this patient because it provides distal-to-proximal, retrograde massage mobilizing tissue fluid against back pressure which then abruptly ends before reaching regional LNs leaving dense, protein rich fluid at the joint    Consulted and Agree with Plan of Care  Patient       Patient will benefit from skilled therapeutic intervention in order to improve the following deficits and impairments:   Body Structure / Function / Physical Skills: ADL, Endurance, Decreased knowledge of precautions, Decreased knowledge of use of DME, Flexibility, IADL, Pain, Skin integrity, Edema, Mobility, ROM       Visit Diagnosis: Lymphedema, not elsewhere classified    Problem List Patient Active Problem List   Diagnosis Date Noted  . Encounter for long-term (current) use of other medications 01/18/2018  . Hypothyroidism 01/18/2018  . Myalgia and myositis 01/18/2018  . Chronic nephritis 01/18/2018  . Other interstitial pulmonary diseases with fibrosis in diseases classified elsewhere 01/18/2018  . Other long term (current) drug therapy 01/18/2018  . Other specified diabetes mellitus without complications (HCC) 01/18/2018  . Sleep apnea  01/18/2018  . Unspecified persistent mental disorders due to conditions classified elsewhere 01/18/2018  . Venous reflux 01/18/2018  . Vitamin B12 deficiency 01/18/2018  . Vitamin D deficiency 01/18/2018  . Obstructive lung disease (HCC) 12/30/2017  . 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 (HCC) 11/28/2017  . Chronic cough 11/11/2017  . Pulmonary hypertension (HCC) 11/11/2017  . Pulmonary arterial hypertension (HCC) 10/07/2017  . Acute ITP (HCC) 01/05/2017  . ESRD (end stage renal disease) (HCC) 12/28/2016  . Hemolytic anemia (HCC) 07/26/2016  . Epistaxis, recurrent 07/26/2016  . Thrombocytopenia (HCC) 07/01/2016  .   ARF (acute renal failure) ([place]) 06/25/2016  . Anxiety 06/25/2016  . Bilateral carpal tunnel syndrome 10/22/2015  . Chronic gout without tophus 10/22/2015  . Chronic nonintractable headache 10/08/2015  . Fibroid uterus 01/03/2012  . H/O vitamin D deficiency 01/03/2012  . Post-menopausal 01/03/2012  . Hereditary and idiopathic peripheral neuropathy 11/19/2011  . Intestinal malabsorption 11/19/2011  . Lichen planus 19/37/9024  . Low back pain 11/19/2011  . Diffuse spasm of esophagus 11/11/2011  . ESR raised 11/11/2011  . Postinflammatory pulmonary fibrosis ([place]) 11/11/2011  . Scleroderma ([place]) 11/16/2010  . Rheumatoid arthritis ([place]) 11/16/2010  . Raynaud's disease 11/16/2010  . Symptomatic menopausal or female climacteric states 11/16/2010    Andrey Spearman, MS, OTR/L, [facility] 04/20/19 12:28 PM   [place] MAIN [facility] SERVICES [address] [place], [place], [address] Phone: [phone]   Fax:  [phone]  Name: NAFISAH RUNIONS MRN: [id number] Date of Birth: 1955/04/04

## 2019-04-25 ENCOUNTER — Ambulatory Visit: Payer: Medicare Other | Admitting: Occupational Therapy

## 2019-04-25 ENCOUNTER — Other Ambulatory Visit: Payer: Self-pay

## 2019-04-25 DIAGNOSIS — I890 Lymphedema, not elsewhere classified: Secondary | ICD-10-CM

## 2019-04-25 NOTE — Therapy (Signed)
[place] MAIN [facility] SERVICES [address] [place], [place], [address] Phone: [phone]   Fax:  [phone]  Occupational Therapy Treatment  Patient Details  Name: TASHA DIAZ MRN: [id number] Date of Birth: [birth year] Referring Provider (OT): Curt Jews, MD   Encounter Date: 04/25/2019  OT End of Session - 04/25/19 1245    Visit Number  9    Number of Visits  36    Date for OT Re-Evaluation  06/14/19    OT Start Time  1122    OT Stop Time  1220    OT Time Calculation (min)  58 min    Activity Tolerance  Patient tolerated treatment well;No increased pain       Past Medical History:  Diagnosis Date  . Achalasia   . Anxiety   . Dysplasia of cervix, low grade (CIN 1)   . Environmental allergies    "all year long" (12/27/2016)  . ESRD (end stage renal disease) on dialysis [facility])    "TTS; [place]" (12/27/2016)  . Fibromyalgia   . GERD (gastroesophageal reflux disease)   . Gout   . Hypertension   . IBS (irritable bowel syndrome)   . MVP (mitral valve prolapse)   . RA (rheumatoid arthritis) (HCC)    FOLLOWED BY DR. SHANAHAN  . Raynaud's disease   . Scleroderma ([place])   . Seasonal allergies   . Thrombocytopenia ([place]) 07/01/2016   Acute fall to 13,000 07/01/16  . Tubular adenoma 01/08/2008   CECUM  . Vitamin D deficiency     Past Surgical History:  Procedure Laterality Date  . ANKLE FRACTURE SURGERY Right   . AV FISTULA PLACEMENT Left 06/28/2016   Procedure: left arm ARTERIOVENOUS (AV) FISTULA CREATION;  Surgeon: Rosetta Posner, MD;  Location: [place];  Service: Vascular;  Laterality: Left;  . BASCILIC VEIN TRANSPOSITION Left 09/27/2016   Procedure: LEFT UPPER ARM CEPHALIC VEIN TRANSPOSITION;  Surgeon: Rosetta Posner, MD;  Location: [place];  Service: Vascular;  Laterality: Left;  . BREAST BIOPSY     "? side"  . CESAREAN SECTION  1994  . CO2 LASER OF CERVIX    . COLONOSCOPY W/ BIOPSIES  01/08/2008  . INSERTION OF DIALYSIS  CATHETER Right 06/28/2016   Procedure: INSERTION OF DIALYSIS CATHETER, right internal jugular;  Surgeon: Rosetta Posner, MD;  Location: [place];  Service: Vascular;  Laterality: Right;  . MYOMECTOMY    . PELVIC LAPAROSCOPY  2011  . superficial thrombophlebitis Left 07-2014    There were no vitals filed for this visit.  Subjective Assessment - 04/25/19 1238    Subjective   [name] presents for OT visit 9//36 for BLE lymphedema care. Pt is 22 minutes late fpr a 60 min appt. Pt is unaccompanied today. Pt reports foam under wraps is irritating to her skin.    Patient is accompanied by:  Family member    Pertinent History  Scleroderma, RA,HTN, ESRD (dialysis), hx R ankle Fx, 2000, anxiety,fibromyalgia, gout, HTN, MVP, Raynaud's  disease, C section, ostructive lung disease, B CTS, hereditary and periferal neuropathy, venous reflux    Limitations  impaired functional ambulation and mobility, chronic leg swelling and associated pain, impaired A/PROM, decreased sensation, impaired standing balance, impaired flexibility    Repetition  Increases Symptoms    Special Tests  +Stemmer bilaterally    Patient Stated Goals  reduce swelling, releive associated pain    Currently in Pain?  No/denies    Pain Onset  More than a month ago                   OT Treatments/Exercises (OP) - 04/25/19 0001      ADLs   ADL Education Given  Yes      Manual Therapy   Manual Therapy  Edema management    Manual therapy comments  skin care using skin grade castor oil to improve skin hydration and to mobilize fibrosis to reduce skin tightness.    Soft tissue mobilization  fibrosis techniques to R foot and ankle    Manual Lymphatic Drainage (MLD)  RLE using short neck, deep abdominals, inguinals and leg sequence. Excelent tolerance.    Compression Bandaging  Modified compression wrap by adding double thickness of cotton stockinett on leg  before applying Rosidal foam. DCd suppliementary foam pads at  dorsal foot and malleoli by Pt request. Poor tolerance.             OT Education - 04/25/19 1244    Education Details  Pt edu re importance of arriving on time for optimal clinical outcome    Person(s) Educated  Patient    Methods  Explanation;Demonstration    Comprehension  Verbalized understanding;Returned demonstration;Need further instruction          OT Long Term Goals - 03/16/19 0956      OT LONG TERM GOAL #1   Title  Pt will be able to verbalize signs and symptoms of cellulitis infection and identify 4 common lymphedema precautions using printed resource for reference (modified independence) with Max assist from spouse to limit LE progression over time.    Baseline  Max A    Time  4    Period  Days    Status  New    Target Date  --   4th OT Rx visit     OT LONG TERM GOAL #2   Title  Pt will be able to apply modified BLE, knee length, multi-layer, short stretch compression wraps daily to one leg at a time using correct gradient techniques with Max caregiver assistance to achieve optimal limb volume reduction, to return affected limb/s, as closely as possible, to premorbid size and shape, to limit infection risk, and to improve safe functional ambulation and mobility.    Baseline  dependent    Time  4    Period  Days    Status  New    Target Date  --   4th OT Rx visit     OT LONG TERM GOAL #3   Title  Pt to achieve at least 5% RLE limb volume reduction in BLE below the knees, during Intensive Phase CDT to improve safe functional mobility and ambulation, to improve functional performance of basic and instrumental ADLs, to limit infection risk and chronic LE progression.    Baseline  Dependent    Time  12    Period  Weeks    Status  New    Target Date  06/14/19            Plan - 04/25/19 1246    Clinical Impression Statement  Manual therapies to RLE today including and fibrosis techniques by Pt request. Thickened edema at dorsal foot visibly and palpably  reduced after therapy. Pt continues to ave difficulty tolerating rosidal foam. She rwports it makes her legs burn and shortens time she is able to wear wraps. In effort to increase tolerance and comfort we applied double layer of cotton stockinett on L  leg before appling foam and wraps.Discussed importance of arrving on time for OT     to obtain best possible clinical outcome. Pt verbalized understanding that abbreviated  manual therapy is less effective for fluid reduction and tissue improvement. Cont as per POC.    OT Occupational Profile and History  Comprehensive Assessment- Review of records and extensive additional review of physical, cognitive, psychosocial history related to current functional performance    Occupational performance deficits (Please refer to evaluation for details):  ADL's;IADL's;Leisure;Rest and Sleep;Social Participation;Other;Work   role performance, Teacher, adult education / Function / Physical Skills  ADL;Endurance;Decreased knowledge of precautions;Decreased knowledge of use of DME;Flexibility;IADL;Pain;Skin integrity;Edema;Mobility;ROM    Rehab Potential  Good    Clinical Decision Making  Multiple treatment options, significant modification of task necessary    Comorbidities Affecting Occupational Performance:  Presence of comorbidities impacting occupational performance    Comorbidities impacting occupational performance description:  multiple inflamatory rheumatological conditions, kidney disease, vascular reflux, , etc... SEE SUBJECTIVE    Modification or Assistance to Complete Evaluation   Min-Moderate modification of tasks or assist with assess necessary to complete eval    OT Frequency  2x / week    OT Duration  12 weeks   and PRN   OT Treatment/Interventions  Self-care/ADL training;Therapeutic exercise;DME and/or AE instruction;Therapist, nutritional;Compression bandaging;Other (comment);Manual Therapy;Scar mobilization;Coping strategies training;Energy  conservation;Manual lymph drainage;Passive range of motion;Therapeutic activities   consider light compression   Plan  Complete Decongestive Therapy for LE care to be modified as needed: MLD, skin care, ther ex, compression. Include myofacial release as tolerated PRN    Recommended Other Services  consider trial in clinic with Flexitouch sequential pneumatic compression device, or "pump" . The 32-chamber Flexitouch is the only available sequential pneumatic compression device providing proximal to distal lymphatic fluid return via regional lymph nodes (LN) deep abdominal pathways and the thoracic duct to the heart. The basic pneumatic pump is not appropriate for this patient because it provides distal-to-proximal, retrograde massage mobilizing tissue fluid against back pressure which then abruptly ends before reaching regional LNs leaving dense, protein rich fluid at the joint    Consulted and Agree with Plan of Care  Patient       Patient will benefit from skilled therapeutic intervention in order to improve the following deficits and impairments:   Body Structure / Function / Physical Skills: ADL, Endurance, Decreased knowledge of precautions, Decreased knowledge of use of DME, Flexibility, IADL, Pain, Skin integrity, Edema, Mobility, ROM       Visit Diagnosis: Lymphedema, not elsewhere classified    Problem List Patient Active Problem List   Diagnosis Date Noted  . Encounter for long-term (current) use of other medications 01/18/2018  . Hypothyroidism 01/18/2018  . Myalgia and myositis 01/18/2018  . Chronic nephritis 01/18/2018  . Other interstitial pulmonary diseases with fibrosis in diseases classified elsewhere 01/18/2018  . Other long term (current) drug therapy 01/18/2018  . Other specified diabetes mellitus without complications ([place]) 93/90/3009  . Sleep apnea 01/18/2018  . Unspecified persistent mental disorders due to conditions classified elsewhere 01/18/2018  . Venous  reflux 01/18/2018  . Vitamin B12 deficiency 01/18/2018  . Vitamin D deficiency 01/18/2018  . Obstructive lung disease ([place]) 12/30/2017  . 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 ([place]) 11/28/2017  . Chronic cough 11/11/2017  . Pulmonary hypertension ([place]) 11/11/2017  . Pulmonary arterial hypertension ([place]) 10/07/2017  . Acute ITP ([place]) 01/05/2017  . ESRD (end stage renal disease) ([place]) 12/28/2016  .  Hemolytic anemia ([place]) 07/26/2016  . Epistaxis, recurrent 07/26/2016  . Thrombocytopenia ([place]) 07/01/2016  . ARF (acute renal failure) ([place]) 06/25/2016  . Anxiety 06/25/2016  . Bilateral carpal tunnel syndrome 10/22/2015  . Chronic gout without tophus 10/22/2015  . Chronic nonintractable headache 10/08/2015  . Fibroid uterus 01/03/2012  . H/O vitamin D deficiency 01/03/2012  . Post-menopausal 01/03/2012  . Hereditary and idiopathic peripheral neuropathy 11/19/2011  . Intestinal malabsorption 11/19/2011  . Lichen planus 02/30/1720  . Low back pain 11/19/2011  . Diffuse spasm of esophagus 11/11/2011  . ESR raised 11/11/2011  . Postinflammatory pulmonary fibrosis ([place]) 11/11/2011  . Scleroderma ([place]) 11/16/2010  . Rheumatoid arthritis ([place]) 11/16/2010  . Raynaud's disease 11/16/2010  . Symptomatic menopausal or female climacteric states 11/16/2010    Andrey Spearman, MS, OTR/L, [facility] 04/25/19 12:53 PM  [place] MAIN [facility] SERVICES [address] [place], [place], [address] Phone: [phone]   Fax:  [phone]  Name: VIVIEN BARRETTO MRN: [id number] Date of Birth: Jan 08, 1956

## 2019-04-27 ENCOUNTER — Ambulatory Visit: Payer: Medicare Other | Admitting: Occupational Therapy

## 2019-05-02 ENCOUNTER — Ambulatory Visit: Payer: Medicare Other | Admitting: Occupational Therapy

## 2019-05-02 ENCOUNTER — Other Ambulatory Visit: Payer: Self-pay

## 2019-05-02 DIAGNOSIS — I890 Lymphedema, not elsewhere classified: Secondary | ICD-10-CM

## 2019-05-02 NOTE — Therapy (Signed)
[place] MAIN [facility] SERVICES [address] [place], [place], [address] Phone: [phone]   Fax:  [phone]  Occupational Therapy Treatment Note and Progress Report: Lymphedema Care  Patient Details  Name: Katie Nunez MRN: [id number] Date of Birth: Feb 28, 1955 Referring Provider (OT): Curt Jews, MD   Encounter Date: 05/02/2019  OT End of Session - 05/02/19 1624    Visit Number  10    Number of Visits  36    Date for OT Re-Evaluation  06/14/19    OT Start Time  1130    OT Stop Time  6644    OT Time Calculation (min)  34 min    Activity Tolerance  Patient tolerated treatment well;No increased pain       Past Medical History:  Diagnosis Date  . Achalasia   . Anxiety   . Dysplasia of cervix, low grade (CIN 1)   . Environmental allergies    "all year long" (12/27/2016)  . ESRD (end stage renal disease) on dialysis [facility])    "TTS; [place]" (12/27/2016)  . Fibromyalgia   . GERD (gastroesophageal reflux disease)   . Gout   . Hypertension   . IBS (irritable bowel syndrome)   . MVP (mitral valve prolapse)   . RA (rheumatoid arthritis) (HCC)    FOLLOWED BY DR. SHANAHAN  . Raynaud's disease   . Scleroderma ([place])   . Seasonal allergies   . Thrombocytopenia ([place]) 07/01/2016   Acute fall to 13,000 07/01/16  . Tubular adenoma 01/08/2008   CECUM  . Vitamin D deficiency     Past Surgical History:  Procedure Laterality Date  . ANKLE FRACTURE SURGERY Right   . AV FISTULA PLACEMENT Left 06/28/2016   Procedure: left arm ARTERIOVENOUS (AV) FISTULA CREATION;  Surgeon: Rosetta Posner, MD;  Location: [place];  Service: Vascular;  Laterality: Left;  . BASCILIC VEIN TRANSPOSITION Left 09/27/2016   Procedure: LEFT UPPER ARM CEPHALIC VEIN TRANSPOSITION;  Surgeon: Rosetta Posner, MD;  Location: [place];  Service: Vascular;  Laterality: Left;  . BREAST BIOPSY     "? side"  . CESAREAN SECTION  1994  . CO2 LASER OF CERVIX    . COLONOSCOPY W/  BIOPSIES  01/08/2008  . INSERTION OF DIALYSIS CATHETER Right 06/28/2016   Procedure: INSERTION OF DIALYSIS CATHETER, right internal jugular;  Surgeon: Rosetta Posner, MD;  Location: [name];  Service: Vascular;  Laterality: Right;  . MYOMECTOMY    . PELVIC LAPAROSCOPY  2011  . superficial thrombophlebitis Left 07-2014    There were no vitals filed for this visit.  Subjective Assessment - 05/02/19 1132    Subjective   Katie Nunez presents for OT visit 10//36 for BLE lymphedema care. Pt is 22 minutes late fpr a 60 min appt. Pt is unaccompanied today. Pt reports foam under wraps is irritating to her skin.    Patient is accompanied by:  Family member    Pertinent History  Scleroderma, RA,HTN, ESRD (dialysis), hx R ankle Fx, 2000, anxiety,fibromyalgia, gout, HTN, MVP, Raynaud's  disease, C section, ostructive lung disease, B CTS, hereditary and periferal neuropathy, venous reflux    Limitations  impaired functional ambulation and mobility, chronic leg swelling and associated pain, impaired A/PROM, decreased sensation, impaired standing balance, impaired flexibility    Repetition  Increases Symptoms    Special Tests  +Stemmer bilaterally    Patient Stated Goals  reduce swelling, releive associated pain    Pain Onset  More  than a month ago          LYMPHEDEMA/ONCOLOGY QUESTIONNAIRE - 05/02/19 1621      Right Lower Extremity Lymphedema   Other  RLE limb volume from ankle to tibial tuberosity (A-D) = 3755.82 ml    Other  RLE A-D volume is increased by 4.34% since initially measured on 03/21/19.              OT Treatments/Exercises (OP) - 05/02/19 0001      ADLs   ADL Education Given  Yes      Manual Therapy   Manual Therapy  Edema management    Edema Management  LLE comparative limb volumetrics to measure progrress towards goals    Compression Bandaging  Modified compression wrap by adding double thickness of cotton stockinett on leg  before applying Rosidal foam. DCd  suppliementary foam pads at dorsal foot and malleoli by Pt request. Poor tolerance.             OT Education - 05/02/19 1623    Education Details  Pt edu re limited progress towards goals    Person(s) Educated  Patient    Methods  Explanation;Demonstration    Comprehension  Verbalized understanding;Returned demonstration;Need further instruction          OT Long Term Goals - 05/02/19 1625      OT LONG TERM GOAL #1   Title  Pt will be able to verbalize signs and symptoms of cellulitis infection and identify 4 common lymphedema precautions using printed resource for reference (modified independence) with Max assist from spouse to limit LE progression over time.    Baseline  Max A    Time  4    Period  Days    Status  Partially Met      OT LONG TERM GOAL #2   Title  Pt will be able to apply modified BLE, knee length, multi-layer, short stretch compression wraps daily to one leg at a time using correct gradient techniques with Max caregiver assistance to achieve optimal limb volume reduction, to return affected limb/s, as closely as possible, to premorbid size and shape, to limit infection risk, and to improve safe functional ambulation and mobility.    Baseline  dependent    Time  4    Period  Days    Status  Achieved      OT LONG TERM GOAL #3   Title  Pt to achieve at least 5% RLE limb volume reduction in BLE below the knees, during Intensive Phase CDT to improve safe functional mobility and ambulation, to improve functional performance of basic and instrumental ADLs, to limit infection risk and chronic LE progression.    Baseline  Dependent    Time  12    Period  Weeks    Status  On-going            Plan - 05/02/19 1626    Clinical Impression Statement  Pt demonstrates limited progress towards limb volume reduction goal thus far. Spouse is able to apply wraps using proper gradient techniques, but Pt has not achieved compliance needed to leave wraps in place 23 hours  a day. She often removes wraps and does reapply them for 24 hours. during visit interval. She returns to clinic with dense  edema in distal lages and feet                        and poor hydration. Pt and Therapist  discussed importance of arriving on time to clinic for optimal manual therapy time and optimal outcomes. Limb volumetrics are increased today by 4.34%  in Rx limb, the RLE, below the knee. Skin condition is unchanged and Pt has not yet learned lymphedema precautions. Pt agrees to work on timliness to clinic and compliance with compression. Cont as per POC.    OT Occupational Profile and History  Comprehensive Assessment- Review of records and extensive additional review of physical, cognitive, psychosocial history related to current functional performance    Occupational performance deficits (Please refer to evaluation for details):  ADL's;IADL's;Leisure;Rest and Sleep;Social Participation;Other;Work   role performance, Teacher, adult education / Function / Physical Skills  ADL;Endurance;Decreased knowledge of precautions;Decreased knowledge of use of DME;Flexibility;IADL;Pain;Skin integrity;Edema;Mobility;ROM    Rehab Potential  Good    Clinical Decision Making  Multiple treatment options, significant modification of task necessary    Comorbidities Affecting Occupational Performance:  Presence of comorbidities impacting occupational performance    Comorbidities impacting occupational performance description:  multiple inflamatory rheumatological conditions, kidney disease, vascular reflux, , etc... SEE SUBJECTIVE    Modification or Assistance to Complete Evaluation   Min-Moderate modification of tasks or assist with assess necessary to complete eval    OT Frequency  2x / week    OT Duration  12 weeks   and PRN   OT Treatment/Interventions  Self-care/ADL training;Therapeutic exercise;DME and/or AE instruction;Therapist, nutritional;Compression bandaging;Other (comment);Manual  Therapy;Scar mobilization;Coping strategies training;Energy conservation;Manual lymph drainage;Passive range of motion;Therapeutic activities   consider light compression   Plan  Complete Decongestive Therapy for LE care to be modified as needed: MLD, skin care, ther ex, compression. Include myofacial release as tolerated PRN    Recommended Other Services  consider trial in clinic with Flexitouch sequential pneumatic compression device, or "pump" . The 32-chamber Flexitouch is the only available sequential pneumatic compression device providing proximal to distal lymphatic fluid return via regional lymph nodes (LN) deep abdominal pathways and the thoracic duct to the heart. The basic pneumatic pump is not appropriate for this patient because it provides distal-to-proximal, retrograde massage mobilizing tissue fluid against back pressure which then abruptly ends before reaching regional LNs leaving dense, protein rich fluid at the joint    Consulted and Agree with Plan of Care  Patient       Patient will benefit from skilled therapeutic intervention in order to improve the following deficits and impairments:   Body Structure / Function / Physical Skills: ADL, Endurance, Decreased knowledge of precautions, Decreased knowledge of use of DME, Flexibility, IADL, Pain, Skin integrity, Edema, Mobility, ROM       Visit Diagnosis: Lymphedema, not elsewhere classified    Problem List Patient Active Problem List   Diagnosis Date Noted  . Encounter for long-term (current) use of other medications 01/18/2018  . Hypothyroidism 01/18/2018  . Myalgia and myositis 01/18/2018  . Chronic nephritis 01/18/2018  . Other interstitial pulmonary diseases with fibrosis in diseases classified elsewhere 01/18/2018  . Other long term (current) drug therapy 01/18/2018  . Other specified diabetes mellitus without complications ([place]) 47/82/9562  . Sleep apnea 01/18/2018  . Unspecified persistent mental disorders due  to conditions classified elsewhere 01/18/2018  . Venous reflux 01/18/2018  . Vitamin B12 deficiency 01/18/2018  . Vitamin D deficiency 01/18/2018  . Obstructive lung disease ([place]) 12/30/2017  . 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 Chronic cough 11/11/2017  . Pulmonary hypertension ([place]) 11/11/2017  . Pulmonary arterial hypertension ([place])  10/07/2017  . Acute ITP ([place]) 01/05/2017  . ESRD (end stage renal disease) ([place]) 12/28/2016  . Hemolytic anemia ([place]) 07/26/2016  . Epistaxis, recurrent 07/26/2016  . Thrombocytopenia ([place]) 07/01/2016  . ARF (acute renal failure) ([place]) 06/25/2016  . Anxiety 06/25/2016  . Bilateral carpal tunnel syndrome 10/22/2015  . Chronic gout without tophus 10/22/2015  . Chronic nonintractable headache 10/08/2015  . Fibroid uterus 01/03/2012  . H/O vitamin D deficiency 01/03/2012  . Post-menopausal 01/03/2012  . Hereditary and idiopathic peripheral neuropathy 11/19/2011  . Intestinal malabsorption 11/19/2011  . Lichen planus 93/01/2377  . Low back pain 11/19/2011  . Diffuse spasm of esophagus 11/11/2011  . ESR raised 11/11/2011  . Postinflammatory pulmonary fibrosis ([place]) 11/11/2011  . Scleroderma ([place]) 11/16/2010  . Rheumatoid arthritis ([place]) 11/16/2010  . Raynaud's disease 11/16/2010  . Symptomatic menopausal or female climacteric states 11/16/2010    Andrey Spearman, MS, OTR/L, [facility] 05/02/19 4:31 PM  [place] MAIN [facility] SERVICES [address] [place], [place], [address] Phone: [phone]   Fax:  [phone]  Name: Katie Nunez MRN: [id number] Date of Birth: 12-15-1955

## 2019-05-04 ENCOUNTER — Ambulatory Visit: Payer: Medicare Other | Admitting: Occupational Therapy

## 2019-05-04 ENCOUNTER — Other Ambulatory Visit: Payer: Self-pay

## 2019-05-04 DIAGNOSIS — I890 Lymphedema, not elsewhere classified: Secondary | ICD-10-CM

## 2019-05-04 NOTE — Therapy (Signed)
[place] MAIN [facility] SERVICES [address] [place], [place], [address] Phone: [phone]   Fax:  [phone]  Occupational Therapy Treatment  Patient Details  Name: Katie Nunez MRN: [id number] Date of Birth: [birth year] Referring Provider (OT): Curt Jews, MD   Encounter Date: 05/04/2019  OT End of Session - 05/04/19 1204    Visit Number  11    Number of Visits  36    Date for OT Re-Evaluation  06/14/19    OT Start Time  1105    OT Stop Time  1205    OT Time Calculation (min)  60 min       Past Medical History:  Diagnosis Date  . Achalasia   . Anxiety   . Dysplasia of cervix, low grade (CIN 1)   . Environmental allergies    "all year long" (12/27/2016)  . ESRD (end stage renal disease) on dialysis [facility])    "TTS; [place]" (12/27/2016)  . Fibromyalgia   . GERD (gastroesophageal reflux disease)   . Gout   . Hypertension   . IBS (irritable bowel syndrome)   . MVP (mitral valve prolapse)   . RA (rheumatoid arthritis) (HCC)    FOLLOWED BY DR. SHANAHAN  . Raynaud's disease   . Scleroderma ([place])   . Seasonal allergies   . Thrombocytopenia ([place]) 07/01/2016   Acute fall to 13,000 07/01/16  . Tubular adenoma 01/08/2008   CECUM  . Vitamin D deficiency     Past Surgical History:  Procedure Laterality Date  . ANKLE FRACTURE SURGERY Right   . AV FISTULA PLACEMENT Left 06/28/2016   Procedure: left arm ARTERIOVENOUS (AV) FISTULA CREATION;  Surgeon: Rosetta Posner, MD;  Location: [place];  Service: Vascular;  Laterality: Left;  . BASCILIC VEIN TRANSPOSITION Left 09/27/2016   Procedure: LEFT UPPER ARM CEPHALIC VEIN TRANSPOSITION;  Surgeon: Rosetta Posner, MD;  Location: [place];  Service: Vascular;  Laterality: Left;  . BREAST BIOPSY     "? side"  . CESAREAN SECTION  1994  . CO2 LASER OF CERVIX    . COLONOSCOPY W/ BIOPSIES  01/08/2008  . INSERTION OF DIALYSIS CATHETER Right 06/28/2016   Procedure: INSERTION OF DIALYSIS CATHETER, right  internal jugular;  Surgeon: Rosetta Posner, MD;  Location: [place];  Service: Vascular;  Laterality: Right;  . MYOMECTOMY    . PELVIC LAPAROSCOPY  2011  . superficial thrombophlebitis Left 07-2014    There were no vitals filed for this visit.                OT Treatments/Exercises (OP) - 05/04/19 0001      ADLs   ADL Education Given  Yes      Manual Therapy   Manual Therapy  Edema management    Edema Management  skin care w low ph castor oil during MLD    Manual Lymphatic Drainage (MLD)  RLE using short neck, deep abdominals, inguinals and leg sequence. Excelent tolerance.    Compression Bandaging  Modified compression wrap by adding double thickness of cotton stockinett on leg  before applying Rosidal foam. DCd suppliementary foam pads at dorsal foot and malleoli by Pt request. Poor tolerance.                  OT Long Term Goals - 05/02/19 1625      OT LONG TERM GOAL #1   Title  Pt will be able to verbalize signs and symptoms of cellulitis infection  and identify 4 common lymphedema precautions using printed resource for reference (modified independence) with Max assist from spouse to limit LE progression over time.    Baseline  Max A    Time  4    Period  Days    Status  Partially Met      OT LONG TERM GOAL #2   Title  Pt will be able to apply modified BLE, knee length, multi-layer, short stretch compression wraps daily to one leg at a time using correct gradient techniques with Max caregiver assistance to achieve optimal limb volume reduction, to return affected limb/s, as closely as possible, to premorbid size and shape, to limit infection risk, and to improve safe functional ambulation and mobility.    Baseline  dependent    Time  4    Period  Days    Status  Achieved      OT LONG TERM GOAL #3   Title  Pt to achieve at least 5% RLE limb volume reduction in BLE below the knees, during Intensive Phase CDT to improve safe functional mobility and  ambulation, to improve functional performance of basic and instrumental ADLs, to limit infection risk and chronic LE progression.    Baseline  Dependent    Time  12    Period  Weeks    Status  On-going            Plan - 05/04/19 1209    Clinical Impression Statement  Pt arrived on time and thus was able to receive full one hour Rx session, including 50 minutes MLD using fibrosis techniques distally, compression wraps and Pt/ family edu re alternative adjustable compression garments, custom compression stockings and HOS devices designed to reduce dense subcutaneous fibrosis. Pt tolerated all aspects of OT today without difficulty.. Cont as per POC.    OT Occupational Profile and History  Comprehensive Assessment- Review of records and extensive additional review of physical, cognitive, psychosocial history related to current functional performance    Occupational performance deficits (Please refer to evaluation for details):  ADL's;IADL's;Leisure;Rest and Sleep;Social Participation;Other;Work   role performance, Teacher, adult education / Function / Physical Skills  ADL;Endurance;Decreased knowledge of precautions;Decreased knowledge of use of DME;Flexibility;IADL;Pain;Skin integrity;Edema;Mobility;ROM    Rehab Potential  Good    Clinical Decision Making  Multiple treatment options, significant modification of task necessary    Comorbidities Affecting Occupational Performance:  Presence of comorbidities impacting occupational performance    Comorbidities impacting occupational performance description:  multiple inflamatory rheumatological conditions, kidney disease, vascular reflux, , etc... SEE SUBJECTIVE    Modification or Assistance to Complete Evaluation   Min-Moderate modification of tasks or assist with assess necessary to complete eval    OT Frequency  2x / week    OT Duration  12 weeks   and PRN   OT Treatment/Interventions  Self-care/ADL training;Therapeutic exercise;DME and/or  AE instruction;Therapist, nutritional;Compression bandaging;Other (comment);Manual Therapy;Scar mobilization;Coping strategies training;Energy conservation;Manual lymph drainage;Passive range of motion;Therapeutic activities   consider light compression   Plan  Complete Decongestive Therapy for LE care to be modified as needed: MLD, skin care, ther ex, compression. Include myofacial release as tolerated PRN    Recommended Other Services  consider trial in clinic with Flexitouch sequential pneumatic compression device, or "pump" . The 32-chamber Flexitouch is the only available sequential pneumatic compression device providing proximal to distal lymphatic fluid return via regional lymph nodes (LN) deep abdominal pathways and the thoracic duct to the heart. The basic pneumatic pump  is not appropriate for this patient because it provides distal-to-proximal, retrograde massage mobilizing tissue fluid against back pressure which then abruptly ends before reaching regional LNs leaving dense, protein rich fluid at the joint    Consulted and Agree with Plan of Care  Patient       Patient will benefit from skilled therapeutic intervention in order to improve the following deficits and impairments:   Body Structure / Function / Physical Skills: ADL, Endurance, Decreased knowledge of precautions, Decreased knowledge of use of DME, Flexibility, IADL, Pain, Skin integrity, Edema, Mobility, ROM       Visit Diagnosis: Lymphedema, not elsewhere classified    Problem List Patient Active Problem List   Diagnosis Date Noted  . Encounter for long-term (current) use of other medications 01/18/2018  . Hypothyroidism 01/18/2018  . Myalgia and myositis 01/18/2018  . Chronic nephritis 01/18/2018  . Other interstitial pulmonary diseases with fibrosis in diseases classified elsewhere 01/18/2018  . Other long term (current) drug therapy 01/18/2018  . Other specified diabetes mellitus without complications  ([place]) 13/08/6576  . Sleep apnea 01/18/2018  . Unspecified persistent mental disorders due to conditions classified elsewhere 01/18/2018  . Venous reflux 01/18/2018  . Vitamin B12 deficiency 01/18/2018  . Vitamin D deficiency 01/18/2018  . Obstructive lung disease ([place]) 12/30/2017  . 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 Chronic cough 11/11/2017  . Pulmonary hypertension ([place]) 11/11/2017  . Pulmonary arterial hypertension ([place]) 10/07/2017  . Acute ITP ([place]) 01/05/2017  . ESRD (end stage renal disease) ([place]) 12/28/2016  . Hemolytic anemia ([place]) 07/26/2016  . Epistaxis, recurrent 07/26/2016  . Thrombocytopenia ([place]) 07/01/2016  . ARF (acute renal failure) ([place]) 06/25/2016  . Anxiety 06/25/2016  . Bilateral carpal tunnel syndrome 10/22/2015  . Chronic gout without tophus 10/22/2015  . Chronic nonintractable headache 10/08/2015  . Fibroid uterus 01/03/2012  . H/O vitamin D deficiency 01/03/2012  . Post-menopausal 01/03/2012  . Hereditary and idiopathic peripheral neuropathy 11/19/2011  . Intestinal malabsorption 11/19/2011  . Lichen planus 46/96/2952  . Low back pain 11/19/2011  . Diffuse spasm of esophagus 11/11/2011  . ESR raised 11/11/2011  . Postinflammatory pulmonary fibrosis ([place]) 11/11/2011  . Scleroderma ([place]) 11/16/2010  . Rheumatoid arthritis ([place]) 11/16/2010  . Raynaud's disease 11/16/2010  . Symptomatic menopausal or female climacteric states 11/16/2010   Andrey Spearman, MS, OTR/L, [facility] 05/04/19 12:13 PM   [place] MAIN [facility] SERVICES [address] [place], [place], [address] Phone: [phone]   Fax:  [phone]  Name: Katie Nunez MRN: [id number] Date of Birth: [birth year]

## 2019-05-07 ENCOUNTER — Other Ambulatory Visit: Payer: Self-pay

## 2019-05-07 ENCOUNTER — Ambulatory Visit: Payer: Medicare Other | Admitting: Podiatrist

## 2019-05-07 ENCOUNTER — Encounter: Payer: Self-pay | Admitting: Podiatrist

## 2019-05-07 DIAGNOSIS — B353 Tinea pedis: Secondary | ICD-10-CM

## 2019-05-07 DIAGNOSIS — L600 Ingrowing nail: Secondary | ICD-10-CM | POA: Diagnosis not present

## 2019-05-07 MED ORDER — CLOTRIMAZOLE-BETAMETHASONE 1-0.05 % EX CREA: 1.0000 "application " | TOPICAL_CREAM | Freq: Two times a day (BID) | CUTANEOUS | 2 refills | Status: DC

## 2019-05-07 NOTE — Progress Notes (Signed)
Chief Complaint  Patient presents with  . Ingrown Toenail    R hallux, medial border. Pt stated, "The pain has been worse. The [lateral] side is also becoming tender. No bleeding or drainage".  . Skin Problem    Bilateral 4th and 5th toes. C/o itching and burning. Pt stated, "I tried OTC Athlete's Foot cream, but it isn't helping".     HPI: Patient is 64 y.o. female who presents today for pain on the medial and lateral borders of the right great toenail.  She was seen for a debridement and states instead of the pain getting better it got worse after the toenail was cut back.  She has had some peeling on the medial side of the right hallux and states she did not notice any redness or significant swelling.    Allergies  Allergen Reactions  . Savella [Milnacipran Hcl] Palpitations and Other (See Comments)    Unknown  . Tape Rash and Other (See Comments)    Itch- unsure if it was paper or adhesive tape    Review of systems is reviewed and negative.   Physical Exam  Patient is awake, alert, and oriented x 3.  In no acute distress.    Vascular status is intact with palpable pedal pulses DP and PT bilateral and capillary refill time less than 3 seconds bilateral.  Mild baseline edema is present.   Neurological exam reveals epicritic and protective sensation grossly intact bilateral.   Dermatological exam reveals skin is supple and dry to bilateral feet.  No open lesions present.  Small calluses present under the 4th toe bilateral.  Minimal interdigital maceration is noted 4th interspaces bilateral.   Musculoskeletal exam: Musculature intact with dorsiflexion, plantarflexion, inversion, eversion. Ankle and First MPJ joint range of motion normal.   Right hallux medial border is ingrowing and painful with pressure - lateral side is also moderately ingrown and also painful with pressure.  No sign of infection is present.   Assessment: Ingrowing right hallux nail - non responsive to  conservative slant back procedure. Possible skin fungal infection 4th interspaces bilateral   Plan: Treatment options and alternatives were discussed. Recommended a permanent removal of the medial and lateral nail border of the right hallux nail.  Patient agreed. Skin was prepped with alcohol and a local injection of lidocaine and Marcaine plain was infiltrated to anesthetize the toe. The toe was then prepped with Betadine exsanguinated. The offending nail border is removed and phenol applied.  It was cleansed well with alcohol. Antibiotic ointment and a dressing was then applied and the patient was given instructions for aftercare. She will be seen back in 1 week for a recheck.  A prescription for lotrisone was called in as well for her to use on the itchy toes. She is told to call sooner if any concerns arise prior to this visit.

## 2019-05-07 NOTE — Patient Instructions (Signed)

## 2019-05-09 ENCOUNTER — Ambulatory Visit: Payer: Medicare Other | Admitting: Occupational Therapy

## 2019-05-09 ENCOUNTER — Encounter: Payer: Medicare Other | Admitting: Occupational Therapy

## 2019-05-09 ENCOUNTER — Ambulatory Visit
Admission: RE | Admit: 2019-05-09 | Discharge: 2019-05-09 | Disposition: A | Discharge status: Home / Self Care | Payer: Medicare Other | Source: Ambulatory Visit | Attending: Physician Assistant | Admitting: Physician Assistant

## 2019-05-09 ENCOUNTER — Other Ambulatory Visit: Payer: Self-pay | Admitting: Physician Assistant

## 2019-05-09 DIAGNOSIS — K5900 Constipation, unspecified: Secondary | ICD-10-CM

## 2019-05-10 ENCOUNTER — Other Ambulatory Visit: Payer: Self-pay | Admitting: Physician Assistant

## 2019-05-10 ENCOUNTER — Ambulatory Visit: Payer: Medicare Other | Admitting: Emergency Medicine

## 2019-05-10 ENCOUNTER — Encounter: Payer: Self-pay | Admitting: Oncology

## 2019-05-10 DIAGNOSIS — R1900 Intra-abdominal and pelvic swelling, mass and lump, unspecified site: Secondary | ICD-10-CM

## 2019-05-11 ENCOUNTER — Other Ambulatory Visit: Payer: Self-pay

## 2019-05-11 ENCOUNTER — Ambulatory Visit: Payer: Medicare Other | Admitting: Occupational Therapy

## 2019-05-11 DIAGNOSIS — I890 Lymphedema, not elsewhere classified: Secondary | ICD-10-CM

## 2019-05-11 NOTE — Therapy (Signed)
[place] MAIN [facility] SERVICES [address] [place], [place], [address] Phone: [phone]   Fax:  [phone]  Occupational Therapy Treatment  Patient Details  Name: Katie Nunez MRN: [id number] Date of Birth: 1955-10-28 Referring Provider (OT): Curt Jews, MD   Encounter Date: 05/11/2019  OT End of Session - 05/11/19 1218    Visit Number  12    Number of Visits  36    Date for OT Re-Evaluation  06/14/19    OT Start Time  1103    OT Stop Time  1212    OT Time Calculation (min)  69 min    Activity Tolerance  Patient tolerated treatment well;No increased pain       Past Medical History:  Diagnosis Date  . Achalasia   . Anxiety   . Dysplasia of cervix, low grade (CIN 1)   . Environmental allergies    "all year long" (12/27/2016)  . ESRD (end stage renal disease) on dialysis [facility])    "TTS; [place]" (12/27/2016)  . Fibromyalgia   . GERD (gastroesophageal reflux disease)   . Gout   . Hypertension   . IBS (irritable bowel syndrome)   . MVP (mitral valve prolapse)   . RA (rheumatoid arthritis) (HCC)    FOLLOWED BY DR. SHANAHAN  . Raynaud's disease   . Scleroderma ([place])   . Seasonal allergies   . Thrombocytopenia ([place]) 07/01/2016   Acute fall to 13,000 07/01/16  . Tubular adenoma 01/08/2008   CECUM  . Vitamin D deficiency     Past Surgical History:  Procedure Laterality Date  . ANKLE FRACTURE SURGERY Right   . AV FISTULA PLACEMENT Left 06/28/2016   Procedure: left arm ARTERIOVENOUS (AV) FISTULA CREATION;  Surgeon: Rosetta Posner, MD;  Location: [place];  Service: Vascular;  Laterality: Left;  . BASCILIC VEIN TRANSPOSITION Left 09/27/2016   Procedure: LEFT UPPER ARM CEPHALIC VEIN TRANSPOSITION;  Surgeon: Rosetta Posner, MD;  Location: [place];  Service: Vascular;  Laterality: Left;  . BREAST BIOPSY     "? side"  . CESAREAN SECTION  1994  . CO2 LASER OF CERVIX    . COLONOSCOPY W/ BIOPSIES  01/08/2008  . INSERTION OF DIALYSIS  CATHETER Right 06/28/2016   Procedure: INSERTION OF DIALYSIS CATHETER, right internal jugular;  Surgeon: Rosetta Posner, MD;  Location: [place];  Service: Vascular;  Laterality: Right;  . MYOMECTOMY    . PELVIC LAPAROSCOPY  2011  . superficial thrombophlebitis Left 07-2014    There were no vitals filed for this visit.  Subjective Assessment - 05/11/19 1212    Subjective   Katie Nunez presents for OT visit 12//36 for BLE lymphedema care. Pt is accompanied by her spouse and is on time.  Pt denies pain in legs today. She presents with silicone cap on great toe of R foot s/p ingrown tonail procedure.    Patient is accompanied by:  Family member    Pertinent History  Scleroderma, RA,HTN, ESRD (dialysis), hx R ankle Fx, 2000, anxiety,fibromyalgia, gout, HTN, MVP, Raynaud's  disease, C section, ostructive lung disease, B CTS, hereditary and periferal neuropathy, venous reflux    Limitations  impaired functional ambulation and mobility, chronic leg swelling and associated pain, impaired A/PROM, decreased sensation, impaired standing balance, impaired flexibility    Repetition  Increases Symptoms    Special Tests  +Stemmer bilaterally    Patient Stated Goals  reduce swelling, releive associated pain    Pain Onset  More than a month ago                   OT Treatments/Exercises (OP) - 05/11/19 0001      ADLs   ADL Education Given  Yes      Manual Therapy   Manual Therapy  Edema management   MLD emphasis on LLE   Edema Management  skin care w low ph castor oil during MLD    Manual Lymphatic Drainage (MLD)  RLE using short neck, deep abdominals, inguinals and leg sequence. Excelent tolerance.    Compression Bandaging  LLE 3 layer gradient compression wrap. Pt wearing compression garment on RLE.             OT Education - 05/11/19 1215    Education Details  Pt educated re importance of upright posture to limit kyphosis and to improve diaphragmatic breathing for optimal  lymphatic function and respiration. Pilar Plate discussion regarding slow progress to date due to decreased OT frequency, 1 x weekly instead of recommended 2 x /wk frequency. Pt verbalized understanding that frequent lymphatic stimulation and compression wrapping is essential to reduce limb volume and fibrosis. Pt  verbalized understanding that without at least frequency initially recommended, profnosis is less than optimal.    Person(s) Educated  Patient;Spouse    Methods  Explanation;Demonstration    Comprehension  Verbalized understanding;Returned demonstration;Need further instruction          OT Long Term Goals - 05/02/19 1625      OT LONG TERM GOAL #1   Title  Pt will be able to verbalize signs and symptoms of cellulitis infection and identify 4 common lymphedema precautions using printed resource for reference (modified independence) with Max assist from spouse to limit LE progression over time.    Baseline  Max A    Time  4    Period  Days    Status  Partially Met      OT LONG TERM GOAL #2   Title  Pt will be able to apply modified BLE, knee length, multi-layer, short stretch compression wraps daily to one leg at a time using correct gradient techniques with Max caregiver assistance to achieve optimal limb volume reduction, to return affected limb/s, as closely as possible, to premorbid size and shape, to limit infection risk, and to improve safe functional ambulation and mobility.    Baseline  dependent    Time  4    Period  Days    Status  Achieved      OT LONG TERM GOAL #3   Title  Pt to achieve at least 5% RLE limb volume reduction in BLE below the knees, during Intensive Phase CDT to improve safe functional mobility and ambulation, to improve functional performance of basic and instrumental ADLs, to limit infection risk and chronic LE progression.    Baseline  Dependent    Time  12    Period  Weeks    Status  On-going            Plan - 05/11/19 1220    Clinical  Impression Statement  Emphasis of session was on manual therapy to LLE. Pt tolerated MLD and fibrosis techniques without increased pain. Pt requested spouse and Pt edu next session for simple self-MLD. Spouse did not comment re plan. Handout given for preparation.Burnis Medin explore this further next visitl.Marland Kitchen Response to CDT and progress towards fluid and fibrosis  reduction  goals remains slow and less than optimal at 1 x  weekly frequency. Pt and spouse educated re need for frequent manual Rx and compression to ensure their expectations are in line with less than optimal outcome. as Pt attends OT 1 x weekly insead of recommended 2 x weekly frequency.    OT Occupational Profile and History  Comprehensive Assessment- Review of records and extensive additional review of physical, cognitive, psychosocial history related to current functional performance    Occupational performance deficits (Please refer to evaluation for details):  ADL's;IADL's;Leisure;Rest and Sleep;Social Participation;Other;Work   role performance, Teacher, adult education / Function / Physical Skills  ADL;Endurance;Decreased knowledge of precautions;Decreased knowledge of use of DME;Flexibility;IADL;Pain;Skin integrity;Edema;Mobility;ROM    Rehab Potential  Good    Clinical Decision Making  Multiple treatment options, significant modification of task necessary    Comorbidities Affecting Occupational Performance:  Presence of comorbidities impacting occupational performance    Comorbidities impacting occupational performance description:  multiple inflamatory rheumatological conditions, kidney disease, vascular reflux, , etc... SEE SUBJECTIVE    Modification or Assistance to Complete Evaluation   Min-Moderate modification of tasks or assist with assess necessary to complete eval    OT Frequency  2x / week    OT Duration  12 weeks   and PRN   OT Treatment/Interventions  Self-care/ADL training;Therapeutic exercise;DME and/or AE  instruction;Therapist, nutritional;Compression bandaging;Other (comment);Manual Therapy;Scar mobilization;Coping strategies training;Energy conservation;Manual lymph drainage;Passive range of motion;Therapeutic activities   consider light compression   Plan  Complete Decongestive Therapy for LE care to be modified as needed: MLD, skin care, ther ex, compression. Include myofacial release as tolerated PRN    Recommended Other Services  consider trial in clinic with Flexitouch sequential pneumatic compression device, or "pump" . The 32-chamber Flexitouch is the only available sequential pneumatic compression device providing proximal to distal lymphatic fluid return via regional lymph nodes (LN) deep abdominal pathways and the thoracic duct to the heart. The basic pneumatic pump is not appropriate for this patient because it provides distal-to-proximal, retrograde massage mobilizing tissue fluid against back pressure which then abruptly ends before reaching regional LNs leaving dense, protein rich fluid at the joint    Consulted and Agree with Plan of Care  Patient       Patient will benefit from skilled therapeutic intervention in order to improve the following deficits and impairments:   Body Structure / Function / Physical Skills: ADL, Endurance, Decreased knowledge of precautions, Decreased knowledge of use of DME, Flexibility, IADL, Pain, Skin integrity, Edema, Mobility, ROM       Visit Diagnosis: Lymphedema, not elsewhere classified    Problem List Patient Active Problem List   Diagnosis Date Noted  . Encounter for long-term (current) use of other medications 01/18/2018  . Hypothyroidism 01/18/2018  . Myalgia and myositis 01/18/2018  . Chronic nephritis 01/18/2018  . Other interstitial pulmonary diseases with fibrosis in diseases classified elsewhere 01/18/2018  . Other long term (current) drug therapy 01/18/2018  . Other specified diabetes mellitus without complications ([place])  35/46/5681  . Sleep apnea 01/18/2018  . Unspecified persistent mental disorders due to conditions classified elsewhere 01/18/2018  . Venous reflux 01/18/2018  . Vitamin B12 deficiency 01/18/2018  . Vitamin D deficiency 01/18/2018  . Obstructive lung disease ([place]) 12/30/2017  . 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 Chronic cough 11/11/2017  . Pulmonary hypertension ([place]) 11/11/2017  . Pulmonary arterial hypertension ([place]) 10/07/2017  . Acute ITP ([place]) 01/05/2017  . ESRD (end stage renal disease) ([place]) 12/28/2016  . Hemolytic anemia (  [place]) 07/26/2016  . Epistaxis, recurrent 07/26/2016  . Thrombocytopenia ([place]) 07/01/2016  . ARF (acute renal failure) ([place]) 06/25/2016  . Anxiety 06/25/2016  . Bilateral carpal tunnel syndrome 10/22/2015  . Chronic gout without tophus 10/22/2015  . Chronic nonintractable headache 10/08/2015  . Fibroid uterus 01/03/2012  . H/O vitamin D deficiency 01/03/2012  . Post-menopausal 01/03/2012  . Hereditary and idiopathic peripheral neuropathy 11/19/2011  . Intestinal malabsorption 11/19/2011  . Lichen planus 82/50/5397  . Low back pain 11/19/2011  . Diffuse spasm of esophagus 11/11/2011  . ESR raised 11/11/2011  . Postinflammatory pulmonary fibrosis ([place]) 11/11/2011  . Scleroderma ([place]) 11/16/2010  . Rheumatoid arthritis ([place]) 11/16/2010  . Raynaud's disease 11/16/2010  . Symptomatic menopausal or female climacteric states 11/16/2010    Andrey Spearman, MS, OTR/L, [facility] 05/11/19 12:29 PM   [place] MAIN [facility] SERVICES [address] [place], [place], [address] Phone: [phone]   Fax:  [phone]  Name: Katie Nunez MRN: [id number] Date of Birth: 11-20-1955

## 2019-05-14 ENCOUNTER — Encounter: Payer: Self-pay | Admitting: Podiatrist

## 2019-05-14 ENCOUNTER — Other Ambulatory Visit: Payer: Self-pay

## 2019-05-14 ENCOUNTER — Ambulatory Visit: Payer: Medicare Other | Admitting: Podiatrist

## 2019-05-14 VITALS — Temp 97.8°F

## 2019-05-14 DIAGNOSIS — L600 Ingrowing nail: Secondary | ICD-10-CM

## 2019-05-14 NOTE — Progress Notes (Signed)
Chief Complaint  Patient presents with  . Follow-up    S/p R hallux ingrown nail procedure - bilateral borders. Pt stated, "I switched from Epsom salt to vinegar because I have very sensitive skin. No more drainage. Some swelling and pain".     HPI: Patient is 64 y.o. female who presents today for follow up of ingrown nail procedure on the right hallux. She states the nail is sore however it is better than before the procedure.  She has been keeping pressure off the toe in a surgical shoe.    Allergies  Allergen Reactions  . Savella [Milnacipran Hcl] Palpitations and Other (See Comments)    Unknown  . Tape Rash and Other (See Comments)    Itch- unsure if it was paper or adhesive tape    Review of systems is reviewed and negative.   Physical Exam  Patient is awake, alert, and oriented x 3.  In no acute distress.    Vascular status is intact with palpable pedal pulses DP and PT bilateral and capillary refill time less than 3 seconds bilateral.  No edema or erythema noted.  Neurological exam reveals epicritic and protective sensation grossly intact bilateral.  Dermatological exam reveals skin is supple and dry to bilateral feet.  No open lesions present.  Right hallux nail is healing nicely.  No redness, no swelling, no drainage, no pus, no sign of infection on the borders that were removed.     Assessment: Status post permanent phenol matrixectomy right hallux medial and lateral borders  Plan:  recommended continued soaks and using antibiotic ointment and bandaid for the next week.  After that she may discontinue the soaks and keep the toe clean with soap and water and a bandaid.  She will be seen back if any concerns arise

## 2019-05-16 ENCOUNTER — Ambulatory Visit: Payer: Medicare Other | Admitting: Occupational Therapy

## 2019-05-18 ENCOUNTER — Ambulatory Visit: Payer: Medicare Other | Admitting: Occupational Therapy

## 2019-05-21 ENCOUNTER — Other Ambulatory Visit: Payer: Medicare Other

## 2019-05-22 ENCOUNTER — Encounter: Payer: Medicare Other | Admitting: Occupational Therapy

## 2019-05-23 ENCOUNTER — Encounter: Payer: Self-pay | Admitting: Occupational Therapy

## 2019-05-23 ENCOUNTER — Telehealth: Payer: Self-pay

## 2019-05-23 NOTE — Therapy (Signed)
[place] MAIN [facility] SERVICES [address] [place], [place], [address] Phone: [phone]   Fax:  [phone]  Occupational Therapy Treatment  Patient Details  Name: Katie Nunez MRN: [id number] Date of Birth: 1955/03/29 Referring Provider (OT): Curt Jews, MD   Encounter Date: 05/23/2019    Past Medical History:  Diagnosis Date  . Achalasia   . Anxiety   . Dysplasia of cervix, low grade (CIN 1)   . Environmental allergies    "all year long" (12/27/2016)  . ESRD (end stage renal disease) on dialysis [facility])    "TTS; [place]" (12/27/2016)  . Fibromyalgia   . GERD (gastroesophageal reflux disease)   . Gout   . Hypertension   . IBS (irritable bowel syndrome)   . MVP (mitral valve prolapse)   . RA (rheumatoid arthritis) (HCC)    FOLLOWED BY DR. SHANAHAN  . Raynaud's disease   . Scleroderma ([place])   . Seasonal allergies   . Thrombocytopenia ([place]) 07/01/2016   Acute fall to 13,000 07/01/16  . Tubular adenoma 01/08/2008   CECUM  . Vitamin D deficiency     Past Surgical History:  Procedure Laterality Date  . ANKLE FRACTURE SURGERY Right   . AV FISTULA PLACEMENT Left 06/28/2016   Procedure: left arm ARTERIOVENOUS (AV) FISTULA CREATION;  Surgeon: Rosetta Posner, MD;  Location: [place];  Service: Vascular;  Laterality: Left;  . BASCILIC VEIN TRANSPOSITION Left 09/27/2016   Procedure: LEFT UPPER ARM CEPHALIC VEIN TRANSPOSITION;  Surgeon: Rosetta Posner, MD;  Location: [place];  Service: Vascular;  Laterality: Left;  . BREAST BIOPSY     "? side"  . CESAREAN SECTION  1994  . CO2 LASER OF CERVIX    . COLONOSCOPY W/ BIOPSIES  01/08/2008  . INSERTION OF DIALYSIS CATHETER Right 06/28/2016   Procedure: INSERTION OF DIALYSIS CATHETER, right internal jugular;  Surgeon: Rosetta Posner, MD;  Location: [place];  Service: Vascular;  Laterality: Right;  . MYOMECTOMY    . PELVIC LAPAROSCOPY  2011  . superficial thrombophlebitis Left 07-2014    There  were no vitals filed for this visit.                             OT Long Term Goals - 05/02/19 1625      OT LONG TERM GOAL #1   Title  Pt will be able to verbalize signs and symptoms of cellulitis infection and identify 4 common lymphedema precautions using printed resource for reference (modified independence) with Max assist from spouse to limit LE progression over time.    Baseline  Max A    Time  4    Period  Days    Status  Partially Met      OT LONG TERM GOAL #2   Title  Pt will be able to apply modified BLE, knee length, multi-layer, short stretch compression wraps daily to one leg at a time using correct gradient techniques with Max caregiver assistance to achieve optimal limb volume reduction, to return affected limb/s, as closely as possible, to premorbid size and shape, to limit infection risk, and to improve safe functional ambulation and mobility.    Baseline  dependent    Time  4    Period  Days    Status  Achieved      OT LONG TERM GOAL #3   Title  Pt to achieve at least 5% RLE  limb volume reduction in BLE below the knees, during Intensive Phase CDT to improve safe functional mobility and ambulation, to improve functional performance of basic and instrumental ADLs, to limit infection risk and chronic LE progression.    Baseline  Dependent    Time  12    Period  Weeks    Status  On-going              Patient will benefit from skilled therapeutic intervention in order to improve the following deficits and impairments:           Visit Diagnosis: No diagnosis found.    Problem List Patient Active Problem List   Diagnosis Date Noted  . Encounter for long-term (current) use of other medications 01/18/2018  . Hypothyroidism 01/18/2018  . Myalgia and myositis 01/18/2018  . Chronic nephritis 01/18/2018  . Other interstitial pulmonary diseases with fibrosis in diseases classified elsewhere 01/18/2018  . Other long term (current)  drug therapy 01/18/2018  . Other specified diabetes mellitus without complications ([place]) 16/10/9602  . Sleep apnea 01/18/2018  . Unspecified persistent mental disorders due to conditions classified elsewhere 01/18/2018  . Venous reflux 01/18/2018  . Vitamin B12 deficiency 01/18/2018  . Vitamin D deficiency 01/18/2018  . Obstructive lung disease ([place]) 12/30/2017  . 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 Chronic cough 11/11/2017  . Pulmonary hypertension ([place]) 11/11/2017  . Pulmonary arterial hypertension ([place]) 10/07/2017  . Acute ITP ([place]) 01/05/2017  . ESRD (end stage renal disease) ([place]) 12/28/2016  . Hemolytic anemia ([place]) 07/26/2016  . Epistaxis, recurrent 07/26/2016  . Thrombocytopenia ([place]) 07/01/2016  . ARF (acute renal failure) ([place]) 06/25/2016  . Anxiety 06/25/2016  . Bilateral carpal tunnel syndrome 10/22/2015  . Chronic gout without tophus 10/22/2015  . Chronic nonintractable headache 10/08/2015  . Fibroid uterus 01/03/2012  . H/O vitamin D deficiency 01/03/2012  . Post-menopausal 01/03/2012  . Hereditary and idiopathic peripheral neuropathy 11/19/2011  . Intestinal malabsorption 11/19/2011  . Lichen planus 54/09/8117  . Low back pain 11/19/2011  . Diffuse spasm of esophagus 11/11/2011  . ESR raised 11/11/2011  . Postinflammatory pulmonary fibrosis ([place]) 11/11/2011  . Scleroderma ([place]) 11/16/2010  . Rheumatoid arthritis ([place]) 11/16/2010  . Raynaud's disease 11/16/2010  . Symptomatic menopausal or female climacteric states 11/16/2010    Ansel Bong 05/23/2019, 2:03 PM  [place] MAIN [facility] SERVICES [address] [place], [place], [address] Phone: [phone]   Fax:  [phone]  Name: Katie Nunez MRN: [id number] Date of Birth: 18-Dec-1955

## 2019-05-23 NOTE — Telephone Encounter (Signed)
Called patient and let her know Dr. Hazeline Junker response to her concern. She verbalized understanding.

## 2019-05-23 NOTE — Telephone Encounter (Signed)
-----   Message from Wyatt Portela, MD sent at 05/23/2019  3:48 PM EDT ----- Please let her know that I reviewed her labs platelets are more than adequate.  I do not think the oozing is related to her platelets.  Thanks ----- Message ----- From: Tami Lin, RN Sent: 05/23/2019   2:55 PM EDT To: Wyatt Portela, MD  Patient called and wants to make you aware that she has bleeding at her graft site after the needle is placed prior to dialysis. She said the blood "oozes" out. She said she is concerned about her platelet count fluctuating and asked me to have [facility] fax over her recent lab results. I placed a copy on your desk. Lanelle Bal

## 2019-05-25 ENCOUNTER — Ambulatory Visit: Payer: Medicare Other | Admitting: Occupational Therapy

## 2019-05-25 ENCOUNTER — Encounter: Payer: Self-pay | Admitting: Occupational Therapy

## 2019-05-25 DIAGNOSIS — I890 Lymphedema, not elsewhere classified: Secondary | ICD-10-CM

## 2019-05-25 NOTE — Therapy (Signed)
[place] MAIN [facility] SERVICES [address] [place], [place], [address] Phone: [phone]   Fax:  [phone]  Discharge Summary  Patient Details  Name: Katie Nunez MRN: [id number] Date of Birth: [birth year] Referring Provider (OT): Curt Jews, MD   Encounter Date: 05/25/2019    Past Medical History:  Diagnosis Date  . Achalasia   . Anxiety   . Dysplasia of cervix, low grade (CIN 1)   . Environmental allergies    "all year long" (12/27/2016)  . ESRD (end stage renal disease) on dialysis [facility])    "TTS; [place] [facility]" (12/27/2016)  . Fibromyalgia   . GERD (gastroesophageal reflux disease)   . Gout   . Hypertension   . IBS (irritable bowel syndrome)   . MVP (mitral valve prolapse)   . RA (rheumatoid arthritis) (HCC)    FOLLOWED BY DR. SHANAHAN  . Raynaud's disease   . Scleroderma ([place])   . Seasonal allergies   . Thrombocytopenia ([place]) 07/01/2016   Acute fall to 13,000 07/01/16  . Tubular adenoma 01/08/2008   CECUM  . Vitamin D deficiency     Past Surgical History:  Procedure Laterality Date  . ANKLE FRACTURE SURGERY Right   . AV FISTULA PLACEMENT Left 06/28/2016   Procedure: left arm ARTERIOVENOUS (AV) FISTULA CREATION;  Surgeon: Rosetta Posner, MD;  Location: [place];  Service: Vascular;  Laterality: Left;  . BASCILIC VEIN TRANSPOSITION Left 09/27/2016   Procedure: LEFT UPPER ARM CEPHALIC VEIN TRANSPOSITION;  Surgeon: Rosetta Posner, MD;  Location: [place];  Service: Vascular;  Laterality: Left;  . BREAST BIOPSY     "? side"  . CESAREAN SECTION  1994  . CO2 LASER OF CERVIX    . COLONOSCOPY W/ BIOPSIES  01/08/2008  . INSERTION OF DIALYSIS CATHETER Right 06/28/2016   Procedure: INSERTION OF DIALYSIS CATHETER, right internal jugular;  Surgeon: Rosetta Posner, MD;  Location: [facility];  Service: Vascular;  Laterality: Right;  . MYOMECTOMY    . PELVIC LAPAROSCOPY  2011  . superficial thrombophlebitis Left 07-2014    There were no vitals  filed for this visit.  Subjective Assessment - 05/25/19 1051    Subjective   Katie Nunez called and stated she would like to DC OT for LE care . She stated,, "I have too much going on and I am in a lot of discomfort."    Patient is accompanied by:  Family member    Pertinent History  Scleroderma, RA,HTN, ESRD (dialysis), hx R ankle Fx, 2000, anxiety,fibromyalgia, gout, HTN, MVP, Raynaud's  disease, C section, ostructive lung disease, B CTS, hereditary and periferal neuropathy, venous reflux    Limitations  impaired functional ambulation and mobility, chronic leg swelling and associated pain, impaired A/PROM, decreased sensation, impaired standing balance, impaired flexibility    Repetition  Increases Symptoms    Special Tests  +[place]mer bilaterally    Patient Stated Goals  reduce swelling, releive associated pain    Pain Onset  More than a month ago                                OT Long Term Goals - 05/25/19 1100      OT LONG TERM GOAL #1   Title  Pt will be able to verbalize signs and symptoms of cellulitis infection and identify 4 common lymphedema precautions using printed resource for reference (modified independence) with Max assist  from spouse to limit LE progression over time.    Baseline  Max A    Time  4    Period  Days    Status  Not Met      OT LONG TERM GOAL #2   Title  Pt will be able to apply modified BLE, knee length, multi-layer, short stretch compression wraps daily to one leg at a time using correct gradient techniques with Max caregiver assistance to achieve optimal limb volume reduction, to return affected limb/s, as closely as possible, to premorbid size and shape, to limit infection risk, and to improve safe functional ambulation and mobility.    Baseline  dependent    Time  4    Period  Days    Status  Achieved      OT LONG TERM GOAL #3   Title  Pt to achieve at least 5% RLE limb volume reduction in BLE below the knees, during  Intensive Phase CDT to improve safe functional mobility and ambulation, to improve functional performance of basic and instrumental ADLs, to limit infection risk and chronic LE progression.    Baseline  Dependent    Time  12    Period  Weeks    Status  Not Met            Plan - 05/25/19 1053    Clinical Impression Statement  Mrs Katie Nunez discontinued OT for LE care today stating that her spouse, who typically assists her with compression wrapping, has an upcoming medical procedure. She has had difficulty attending OT sessions on time due to diarrhea and states she is very uncomfortable with hemmeroids at present. Pt states she would like to rsume OT for LE care when these complicating issues resolve. Pt remains appropriate candidate for CDT. A new referral is needed to resume care.DC OT this date.    OT Occupational Profile and History  Comprehensive Assessment- Review of records and extensive additional review of physical, cognitive, psychosocial history related to current functional performance    Occupational performance deficits (Please refer to evaluation for details):  ADL's;IADL's;Leisure;Rest and Sleep;Social Participation;Other;Work   role performance, Teacher, adult education / Function / Physical Skills  ADL;Endurance;Decreased knowledge of precautions;Decreased knowledge of use of DME;Flexibility;IADL;Pain;Skin integrity;Edema;Mobility;ROM    Rehab Potential  Good    Clinical Decision Making  Multiple treatment options, significant modification of task necessary    Comorbidities Affecting Occupational Performance:  Presence of comorbidities impacting occupational performance    Comorbidities impacting occupational performance description:  multiple inflamatory rheumatological conditions, kidney disease, vascular reflux, , etc... SEE SUBJECTIVE    Modification or Assistance to Complete Evaluation   Min-Moderate modification of tasks or assist with assess necessary to complete eval     OT Frequency  2x / week    OT Duration  12 weeks   and PRN   OT Treatment/Interventions  Self-care/ADL training;Therapeutic exercise;DME and/or AE instruction;Therapist, nutritional;Compression bandaging;Other (comment);Manual Therapy;Scar mobilization;Coping strategies training;Energy conservation;Manual lymph drainage;Passive range of motion;Therapeutic activities   consider light compression   Plan  Complete Decongestive Therapy for LE care to be modified as needed: MLD, skin care, ther ex, compression. Include myofacial release as tolerated PRN    Recommended Other Services  consider trial in clinic with Flexitouch sequential pneumatic compression device, or "pump" . The 32-chamber Flexitouch is the only available sequential pneumatic compression device providing proximal to distal lymphatic fluid return via regional lymph nodes (LN) deep abdominal pathways and the thoracic duct to the  heart. The basic pneumatic pump is not appropriate for this patient because it provides distal-to-proximal, retrograde massage mobilizing tissue fluid against back pressure which then abruptly ends before reaching regional LNs leaving dense, protein rich fluid at the joint    Consulted and Agree with Plan of Care  Patient       Patient will benefit from skilled therapeutic intervention in order to improve the following deficits and impairments:   Body Structure / Function / Physical Skills: ADL, Endurance, Decreased knowledge of precautions, Decreased knowledge of use of DME, Flexibility, IADL, Pain, Skin integrity, Edema, Mobility, ROM       Visit Diagnosis: Lymphedema, not elsewhere classified    Problem List Patient Active Problem List   Diagnosis Date Noted  . Encounter for long-term (current) use of other medications 01/18/2018  . Hypothyroidism 01/18/2018  . Myalgia and myositis 01/18/2018  . Chronic nephritis 01/18/2018  . Other interstitial pulmonary diseases with fibrosis in diseases  classified elsewhere 01/18/2018  . Other long term (current) drug therapy 01/18/2018  . Other specified diabetes mellitus without complications ([place]) 86/82/5749  . Sleep apnea 01/18/2018  . Unspecified persistent mental disorders due to conditions classified elsewhere 01/18/2018  . Venous reflux 01/18/2018  . Vitamin B12 deficiency 01/18/2018  . Vitamin D deficiency 01/18/2018  . Obstructive lung disease ([place]) 12/30/2017  . 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 ([place]) 11/28/2017  . Chronic cough 11/11/2017  . Pulmonary hypertension ([place]) 11/11/2017  . Pulmonary arterial hypertension ([place]) 10/07/2017  . Acute ITP ([place]) 01/05/2017  . ESRD (end stage renal disease) ([place]) 12/28/2016  . Hemolytic anemia ([place]) 07/26/2016  . Epistaxis, recurrent 07/26/2016  . Thrombocytopenia ([place]) 07/01/2016  . ARF (acute renal failure) ([place]) 06/25/2016  . Anxiety 06/25/2016  . Bilateral carpal tunnel syndrome 10/22/2015  . Chronic gout without tophus 10/22/2015  . Chronic nonintractable headache 10/08/2015  . Fibroid uterus 01/03/2012  . H/O vitamin D deficiency 01/03/2012  . Post-menopausal 01/03/2012  . Hereditary and idiopathic peripheral neuropathy 11/19/2011  . Intestinal malabsorption 11/19/2011  . Lichen planus 35/52/1747  . Low back pain 11/19/2011  . Diffuse spasm of esophagus 11/11/2011  . ESR raised 11/11/2011  . Postinflammatory pulmonary fibrosis ([place]) 11/11/2011  . Scleroderma ([place]) 11/16/2010  . Rheumatoid arthritis ([place]) 11/16/2010  . Raynaud's disease 11/16/2010  . Symptomatic menopausal or female climacteric states 11/16/2010    Andrey Spearman, MS, OTR/L, [facility] 05/25/19 11:02 AM    [place] MAIN [facility] SERVICES [address] [place], [place], [address] Phone: [phone]   Fax:  [phone]  Name: Katie Nunez MRN: [id number] Date of Birth: [birth year]

## 2019-05-30 ENCOUNTER — Encounter: Payer: Medicare Other | Admitting: Occupational Therapy

## 2019-05-30 ENCOUNTER — Telehealth: Payer: Self-pay | Admitting: Emergency Medicine

## 2019-05-30 NOTE — Telephone Encounter (Signed)
Spoke with patient regarding prior message . Advised patient that her cardiologist would need to place the. referral to [facility] rehab and they will contact her with a appt. Advised patient at this time they are only doing virtural visit's at this time. Patient's voice was understanding. Nothing else further needed.

## 2019-06-01 ENCOUNTER — Ambulatory Visit: Payer: Medicare Other | Admitting: Occupational Therapy

## 2019-06-06 ENCOUNTER — Encounter: Payer: Medicare Other | Admitting: Occupational Therapy

## 2019-06-06 ENCOUNTER — Ambulatory Visit
Admission: RE | Admit: 2019-06-06 | Discharge: 2019-06-06 | Disposition: A | Discharge status: Home / Self Care | Payer: Medicare Other | Source: Ambulatory Visit | Attending: Physician Assistant | Admitting: Physician Assistant

## 2019-06-06 ENCOUNTER — Other Ambulatory Visit: Payer: Self-pay

## 2019-06-06 DIAGNOSIS — R1900 Intra-abdominal and pelvic swelling, mass and lump, unspecified site: Secondary | ICD-10-CM

## 2019-06-08 ENCOUNTER — Ambulatory Visit: Payer: Medicare Other | Admitting: Occupational Therapy

## 2019-06-13 ENCOUNTER — Encounter: Payer: Medicare Other | Admitting: Occupational Therapy

## 2019-06-15 ENCOUNTER — Ambulatory Visit: Payer: Medicare Other | Admitting: Occupational Therapy

## 2019-06-15 ENCOUNTER — Encounter: Payer: Medicare Other | Admitting: Occupational Therapy

## 2019-06-19 ENCOUNTER — Encounter (HOSPITAL_COMMUNITY): Payer: Self-pay | Admitting: *Deleted

## 2019-06-19 ENCOUNTER — Encounter: Payer: Medicare Other | Admitting: Occupational Therapy

## 2019-06-19 NOTE — Progress Notes (Signed)
Received referral from Dr. Gilles Chiquito at [facility] for this pt to participate in pulmonary rehab with the the diagnosis of Pulmonary Rehab.  Pt with extended medical history which prompted an initial call to pt to clarify and assess pt appropriateness and ability to participare  in group exercise. Spoke pt who confirmed that her dialysis days are T,Th and Sat.  Pt has an early am appt and is usually done around 11;30.  Pt unsure if she could but would like to try.  Pt made aware that on days she feels unusually tired after dialysis she would need to skip on exercise that day.  Pt did go to the OT for lymphedema for leg wraps.  Pt with recent hemorrhoid/fissures issues and may have difficulty with seated exercises.  Pt states that most days she is fine and can sit.  Pt is to undergo rectal rehab to regain some of her anal muscle tone. Clinical review of pt follow up appt on 01/2019 Pulmonary office note as well .  Will forward to support staff for  verification of insurance eligibility/benefits and pulmonary rehab staff for review and contact for scheuling with pt consent. Cherre Huger, BSN Cardiac and Training and development officer

## 2019-06-22 ENCOUNTER — Ambulatory Visit: Payer: Medicare Other | Admitting: Occupational Therapy

## 2019-06-22 ENCOUNTER — Ambulatory Visit: Payer: Medicare Other | Admitting: Emergency Medicine

## 2019-06-25 ENCOUNTER — Other Ambulatory Visit: Payer: Self-pay

## 2019-06-25 ENCOUNTER — Ambulatory Visit (INDEPENDENT_AMBULATORY_CARE_PROVIDER_SITE_OTHER): Payer: Medicare Other | Admitting: Podiatrist

## 2019-06-25 VITALS — Temp 96.6°F

## 2019-06-25 DIAGNOSIS — L600 Ingrowing nail: Secondary | ICD-10-CM

## 2019-06-25 MED ORDER — GENTAMICIN SULFATE 0.1 % EX OINT: TOPICAL_OINTMENT | CUTANEOUS | 0 refills | Status: DC

## 2019-06-25 MED ORDER — DOXYCYCLINE HYCLATE 100 MG PO TABS: 100.0000 mg | ORAL_TABLET | Freq: Two times a day (BID) | ORAL | 0 refills | Status: DC

## 2019-06-25 NOTE — Patient Instructions (Signed)
I have sent in 2 prescriptions for you-  1 is a pill you take twice a day, the other is a cream.  Put the cream on the corners of the right great toe daily.    You may also put some peroxide on the toe corners and this will help dissolve any scab that may be present and causing discomfort.

## 2019-06-26 ENCOUNTER — Encounter: Payer: Self-pay | Admitting: Podiatrist

## 2019-06-26 ENCOUNTER — Encounter: Payer: Medicare Other | Admitting: Occupational Therapy

## 2019-06-26 NOTE — Progress Notes (Signed)
Chief Complaint  Patient presents with  . Follow-up    Ingrown nail procedure - R hallux. Pt stated, "It's still painful - 6/10. Still swollen. I saw dried drainage 5 days ago".    Patient presents today for follow up of ingrown nail procedure on the right hallux nail medial and lateral borders.  She relates the toe is still in pain and she became concerned over dried blood she noticed in the corner of the nail.  She has been using peroxide and antibiotic ointment.  Objective:  Neurovascular status intact and unchanged from previous visit.  Right hallux nail appears to be healing well.  A small amount of swelling is present on the proximal nail fold. No redness, no pus, no drainage, no streaking noted.   Assessment/Plan:  S/p right hallux nail ingrown nail removal medial and lateral borders- permanent  Plan:  I removed eschar from the medial and lateral nail border with a curette.  Recommended continued soaking and called in rx for doxyxclycline and gentamycin ointment.  She will be seen back in 2 weeks.

## 2019-06-27 DIAGNOSIS — K7469 Other cirrhosis of liver: Secondary | ICD-10-CM | POA: Insufficient documentation

## 2019-06-29 ENCOUNTER — Ambulatory Visit: Payer: Medicare Other | Admitting: Occupational Therapy

## 2019-07-03 ENCOUNTER — Encounter: Payer: Medicare Other | Admitting: Occupational Therapy

## 2019-07-06 ENCOUNTER — Ambulatory Visit: Payer: Medicare Other | Admitting: Occupational Therapy

## 2019-07-09 ENCOUNTER — Ambulatory Visit: Payer: Medicare Other | Admitting: Podiatrist

## 2019-07-09 DIAGNOSIS — I50812 Chronic right heart failure: Secondary | ICD-10-CM | POA: Insufficient documentation

## 2019-07-09 DIAGNOSIS — I471 Supraventricular tachycardia: Secondary | ICD-10-CM | POA: Insufficient documentation

## 2019-07-10 ENCOUNTER — Encounter: Payer: Medicare Other | Admitting: Occupational Therapy

## 2019-07-13 ENCOUNTER — Ambulatory Visit: Payer: Medicare Other | Admitting: Occupational Therapy

## 2019-07-18 ENCOUNTER — Telehealth (HOSPITAL_COMMUNITY): Payer: Self-pay | Admitting: Family Medicine

## 2019-07-20 ENCOUNTER — Ambulatory Visit: Payer: Medicare Other | Admitting: Occupational Therapy

## 2019-07-23 ENCOUNTER — Encounter: Payer: Self-pay | Admitting: Podiatrist

## 2019-07-23 ENCOUNTER — Other Ambulatory Visit: Payer: Self-pay

## 2019-07-23 ENCOUNTER — Ambulatory Visit (INDEPENDENT_AMBULATORY_CARE_PROVIDER_SITE_OTHER): Payer: Medicare Other | Admitting: Podiatrist

## 2019-07-23 VITALS — Temp 97.2°F

## 2019-07-23 DIAGNOSIS — L03031 Cellulitis of right toe: Secondary | ICD-10-CM

## 2019-07-23 MED ORDER — DOXYCYCLINE HYCLATE 100 MG PO TABS: 100.0000 mg | ORAL_TABLET | Freq: Two times a day (BID) | ORAL | 0 refills | Status: DC

## 2019-07-23 NOTE — Patient Instructions (Signed)
The procedure today should resolve the toe problem you have been having-  If you notice any swelling or redness in the toe, fill the doxycycline antibiotic prescription and take it.  Keep soaking your feet for 5-10 more days and after that it should be healed.    If the toe continues to give problems after a week, please call.

## 2019-07-23 NOTE — Progress Notes (Signed)
Chief Complaint  Patient presents with   Follow-up    R hallux. Pt has not noticed drainage. Using peroxide and gentamycin. Finished doxycycline. Pain is "more sporadic", but still 6/10.     HPI: Patient is 64 y.o. female who presents today for recheck of s/p permanent ingrown removal of lateral nail border of the right hallux.  States it is getting better but is not well.  Pain when present is 6/10.     Allergies  Allergen Reactions   Savella [Milnacipran Hcl] Palpitations and Other (See Comments)    Unknown   Tape Rash and Other (See Comments)    Itch- unsure if it was paper or adhesive tape    Review of systems is reviewed and negative.   Physical Exam  Patient is awake, alert, and oriented x 3.  In no acute distress.    Vascular status is intact with palpable pedal pulses DP and PT bilateral and capillary refill time less than 3 seconds bilateral.  No edema or erythema noted.  Neurological exam reveals epicritic and protective sensation grossly intact bilateral.  Dermatological exam reveals skin is supple and dry to bilateral feet.  No open lesions present.   Musculoskeletal exam: Musculature intact with dorsiflexion, plantarflexion, inversion, eversion. Ankle and First MPJ joint range of motion normal.   Right hallux proximal nail fold puffiness has improved in all but the proximal lateral corner.  No pus or drainage expressed.  No redness seen  Assessment:    ICD-10-CM   1. Infection of nail bed of toe of right foot  L03.031     Plan: Discussed there may be a small fluid collection at the proximal medial nail fold and I recommended an incision and drainage to allow a channel for drainage. The patient agreed and wishes to proceed. Verbal consent obtained.  Skin was prepped with alcohol and a local injection of lidocaine and Marcaine plain was infiltrated to anesthetize the toe. The toe was then prepped with Betadine exsanguinated. The lateral nail border wasl removed to  allow a channel for drainage. . The toe was  cleansed well with Betadine and hydrogen peroxide solution. Antibiotic ointment and a dressing was then applied and the patient was given instructions for aftercare. rx for doxycycline written.  She will soak daily with epsom salts and will follow up.

## 2019-07-25 ENCOUNTER — Other Ambulatory Visit: Payer: Self-pay | Admitting: Gastroenterology

## 2019-07-26 ENCOUNTER — Telehealth: Payer: Self-pay | Admitting: Podiatrist

## 2019-07-26 ENCOUNTER — Telehealth (HOSPITAL_COMMUNITY): Payer: Self-pay | Admitting: *Deleted

## 2019-07-26 NOTE — Telephone Encounter (Signed)
Pt called stating that she has some white around her toe from prev nail removal pt states she concerned please assist

## 2019-07-27 ENCOUNTER — Ambulatory Visit: Payer: Medicare Other | Admitting: Occupational Therapy

## 2019-07-30 ENCOUNTER — Ambulatory Visit: Payer: Medicare Other | Admitting: Podiatrist

## 2019-07-30 NOTE — Telephone Encounter (Signed)
I spoke with pt and she states she has some bruising and some white at the edge where the toenail was received and she is doing the soaks. Pt states she does have some clear drainage and sharp pain occasionally. I told pt that was not unusual at the time in her recovery and the white flesh was where the open edges of the flesh absorbed the cut edges of skin. I asked pt if she wanted to make a follow up appt and she agreed and I transferred to schedulers.

## 2019-08-03 ENCOUNTER — Ambulatory Visit: Payer: Medicare Other | Admitting: Occupational Therapy

## 2019-08-09 ENCOUNTER — Other Ambulatory Visit: Payer: Self-pay

## 2019-08-09 ENCOUNTER — Inpatient Hospital Stay (HOSPITAL_BASED_OUTPATIENT_CLINIC_OR_DEPARTMENT_OTHER): Payer: Medicare Other | Admitting: Medical

## 2019-08-09 ENCOUNTER — Inpatient Hospital Stay: Payer: Medicare Other | Attending: Medical

## 2019-08-09 ENCOUNTER — Encounter: Payer: Self-pay | Admitting: Medical

## 2019-08-09 VITALS — BP 119/65 | HR 88 | Temp 98.0°F | Resp 18 | Ht 65.0 in | Wt 152.5 lb

## 2019-08-09 DIAGNOSIS — D693 Immune thrombocytopenic purpura: Secondary | ICD-10-CM | POA: Insufficient documentation

## 2019-08-09 DIAGNOSIS — R040 Epistaxis: Secondary | ICD-10-CM

## 2019-08-09 LAB — CBC WITH DIFFERENTIAL (CANCER CENTER ONLY)
Abs Immature Granulocytes: 0.01 10*3/uL (ref 0.00–0.07)
Basophils Absolute: 0 10*3/uL (ref 0.0–0.1)
Basophils Relative: 1 %
Eosinophils Absolute: 0.1 10*3/uL (ref 0.0–0.5)
Eosinophils Relative: 3 %
HCT: 33.8 % — ABNORMAL LOW (ref 36.0–46.0)
Hemoglobin: 10.9 g/dL — ABNORMAL LOW (ref 12.0–15.0)
Immature Granulocytes: 0 %
Lymphocytes Relative: 17 %
Lymphs Abs: 0.6 10*3/uL — ABNORMAL LOW (ref 0.7–4.0)
MCH: 30 pg (ref 26.0–34.0)
MCHC: 32.2 g/dL (ref 30.0–36.0)
MCV: 93.1 fL (ref 80.0–100.0)
Monocytes Absolute: 0.6 10*3/uL (ref 0.1–1.0)
Monocytes Relative: 17 %
Neutro Abs: 2.1 10*3/uL (ref 1.7–7.7)
Neutrophils Relative %: 62 %
Platelet Count: 129 10*3/uL — ABNORMAL LOW (ref 150–400)
RBC: 3.63 MIL/uL — ABNORMAL LOW (ref 3.87–5.11)
RDW: 15.9 % — ABNORMAL HIGH (ref 11.5–15.5)
WBC Count: 3.3 10*3/uL — ABNORMAL LOW (ref 4.0–10.5)
nRBC: 0 % (ref 0.0–0.2)

## 2019-08-09 NOTE — Progress Notes (Signed)
[facility] Progress Note   Katie Nunez [id number] [birth year] 64 y.o.  Katie Nunez is managed by Dr. Alen Blew  Actively treated with chemotherapy/immunotherapy/hormonal therapy: no  Next scheduled appointment with provider: 09/19/2019  Assessment: Plan:    Acute ITP ([place])  Epistaxis   ITP: A CBC was completed today with a platelet count stable at 129,000.  The patient is scheduled to see Dr. Alen Blew in follow-up on 09/19/2019.  Epistaxis: The patient currently has her nose packed with cotton.  She was told to remove this in 1 to 2 hours.  She was told to spray her nose with 2 sprays of Afrin in each nares if bleeding recurs.  If she continues to have bleeding despite this then she should present to the emergency room for evaluation and management.  She reports that she has previously had to have her nose cauterized.  Please see After Visit Summary for patient specific instructions.  Future Appointments  Date Time Provider [place]  08/27/2019 11:45 AM Bronson Ing, DPM [facility]  08/30/2019  3:10 PM [facility] None  09/19/2019 10:00 AM [facility] None  09/19/2019 10:30 AM Shadad, Mathis Dad, MD [facility] None    No orders of the defined types were placed in this encounter.      Subjective:   Patient ID:  Katie Nunez is a 64 y.o. (DOB 27-Oct-1955) female.  Chief Complaint:  Chief Complaint  Patient presents with  . Epistaxis    HPI TOMORROW Katie Nunez  is a 64 y.o. female with a diagnosis of ITP.  She has previously been treated with corticosteroids followed by rituximab under the care of Dr. Beryle Beams.  She is placed on a prednisone taper which she finished in May 2019 and has been in remission since that time.  She was last seen by Dr. Alen Blew on 03/21/2019.  She reports that she has had nosebleeds in the past and has had to have her nose cauterized.  She was at dialysis today when she  developed a nosebleed.  She currently has her nose packed with cotton.  A CBC today returned with a platelet count of 129,000.  She is scheduled to see Dr. Alen Blew in follow-up on 09/19/2019.  She reports that she attempted to remove the cotton from her nose earlier and pulled out a large clot.  Her CBC today showed a hemoglobin of 10.9 and a hematocrit of 33.8.   Medications: I have reviewed the patient's current medications.  Allergies:  Allergies  Allergen Reactions  . Savella [Milnacipran Hcl] Palpitations and Other (See Comments)    Unknown  . Tape Rash and Other (See Comments)    Itch- unsure if it was paper or adhesive tape    Past Medical History:  Diagnosis Date  . Achalasia   . Anxiety   . Dysplasia of cervix, low grade (CIN 1)   . Environmental allergies    "all year long" (12/27/2016)  . ESRD (end stage renal disease) on dialysis [facility])    "TTS; [place]" (12/27/2016)  . Fibromyalgia   . GERD (gastroesophageal reflux disease)   . Gout   . Hypertension   . IBS (irritable bowel syndrome)   . MVP (mitral valve prolapse)   . RA (rheumatoid arthritis) (HCC)    FOLLOWED BY DR. SHANAHAN  . Raynaud's disease   . Scleroderma ([place])   . Seasonal allergies   . Thrombocytopenia ([place]) 07/01/2016   Acute fall to 13,000 07/01/16  .  Tubular adenoma 01/08/2008   CECUM  . Vitamin D deficiency     Past Surgical History:  Procedure Laterality Date  . ANKLE FRACTURE SURGERY Right   . AV FISTULA PLACEMENT Left 06/28/2016   Procedure: left arm ARTERIOVENOUS (AV) FISTULA CREATION;  Surgeon: Rosetta Posner, MD;  Location: [place];  Service: Vascular;  Laterality: Left;  . BASCILIC VEIN TRANSPOSITION Left 09/27/2016   Procedure: LEFT UPPER ARM CEPHALIC VEIN TRANSPOSITION;  Surgeon: Rosetta Posner, MD;  Location: [place];  Service: Vascular;  Laterality: Left;  . BREAST BIOPSY     "? side"  . CESAREAN SECTION  1994  . CO2 LASER OF CERVIX    . COLONOSCOPY W/ BIOPSIES  01/08/2008  .  INSERTION OF DIALYSIS CATHETER Right 06/28/2016   Procedure: INSERTION OF DIALYSIS CATHETER, right internal jugular;  Surgeon: Rosetta Posner, MD;  Location: [place];  Service: Vascular;  Laterality: Right;  . MYOMECTOMY    . PELVIC LAPAROSCOPY  2011  . superficial thrombophlebitis Left 07-2014    Family History  Problem Relation Age of Onset  . Hypertension Mother   . Diabetes Mother   . Heart disease Father   . Hypertension Maternal Aunt   . Diabetes Maternal Grandmother   . Heart disease Paternal Grandfather   . Cerebral palsy Cousin        1ST COUSIN?  . Diabetes Paternal Grandmother     Social History   Socioeconomic History  . Marital status: Married    Spouse name: Not on file  . Number of children: Not on file  . Years of education: Not on file  . Highest education level: Not on file  Occupational History  . Not on file  Tobacco Use  . Smoking status: Never Smoker  . Smokeless tobacco: Never Used  Vaping Use  . Vaping Use: Never used  Substance and Sexual Activity  . Alcohol use: No  . Drug use: No  . Sexual activity: Not Currently    Birth control/protection: Post-menopausal  Other Topics Concern  . Not on file  Social History Narrative  . Not on file   Social Determinants of Health   Financial Resource Strain:   . Difficulty of Paying Living Expenses:   Food Insecurity:   . Worried About Charity fundraiser in the Last Year:   . Arboriculturist in the Last Year:   Transportation Needs:   . Film/video editor (Medical):   Marland Kitchen Lack of Transportation (Non-Medical):   Physical Activity:   . Days of Exercise per Week:   . Minutes of Exercise per Session:   Stress:   . Feeling of Stress :   Social Connections:   . Frequency of Communication with Friends and Family:   . Frequency of Social Gatherings with Friends and Family:   . Attends Religious Services:   . Active Member of Clubs or Organizations:   . Attends Archivist Meetings:   Marland Kitchen  Marital Status:   Intimate Partner Violence:   . Fear of Current or Ex-Partner:   . Emotionally Abused:   Marland Kitchen Physically Abused:   . Sexually Abused:     Past Medical History, Surgical history, Social history, and Family history were reviewed and updated as appropriate.   Please see review of systems for further details on the patient's review from today.   Review of Systems:  Review of Systems  HENT: Positive for nosebleeds.     Objective:   Physical  Exam:  BP 119/65 (BP Location: Left Arm, Patient Position: Sitting)   Pulse 88   Temp 98 F (36.7 C) (Oral)   Resp 18   Ht 5\' 5"  (1.651 m)   Wt 152 lb 8 oz (69.2 kg)   LMP 11/15/2008   BMI 25.38 kg/m  ECOG: 0  Physical Exam Constitutional:      General: She is not in acute distress.    Appearance: Normal appearance. She is not ill-appearing, toxic-appearing or diaphoretic.  HENT:     Head: Normocephalic and atraumatic.     Right Ear: Tympanic membrane, ear canal and external ear normal.     Left Ear: Tympanic membrane, ear canal and external ear normal.     Nose:     Comments: The nares are packed with cotton bilaterally.    Mouth/Throat:     Mouth: Mucous membranes are moist.     Comments: The posterior pharynx shows scant postnasal drainage blood. Skin:    General: Skin is warm and dry.     Findings: No bruising, lesion or rash.     Comments: A dialysis shunt was noted in the left upper extremity.  Neurological:     Mental Status: She is alert.     Coordination: Coordination normal.     Gait: Gait normal.  Psychiatric:        Mood and Affect: Mood normal.        Behavior: Behavior normal.        Thought Content: Thought content normal.        Judgment: Judgment normal.     Lab Review:     Component Value Date/Time   NA 141 07/27/2018 0946   NA 140 09/26/2017 1048   K 3.2 (L) 07/27/2018 0946   CL 105 07/27/2018 0946   CO2 23 07/27/2018 0946   GLUCOSE 82 07/27/2018 0946   BUN 33 (H) 07/27/2018 0946    BUN 47 (H) 09/26/2017 1048   CREATININE 7.36 (H) 07/27/2018 0946   CALCIUM 7.6 (L) 07/27/2018 0946   PROT 6.1 (L) 07/27/2018 0946   PROT 6.3 09/26/2017 1048   ALBUMIN 2.8 (L) 07/27/2018 0946   ALBUMIN 3.8 09/26/2017 1048   AST 24 07/27/2018 0946   ALT 14 07/27/2018 0946   ALKPHOS 109 07/27/2018 0946   BILITOT 0.9 07/27/2018 0946   BILITOT 0.3 09/26/2017 1048   GFRNONAA 5 (L) 07/27/2018 0946   GFRAA 6 (L) 07/27/2018 0946       Component Value Date/Time   WBC 3.3 (L) 08/09/2019 1451   WBC 3.5 (L) 07/27/2018 0946   RBC 3.63 (L) 08/09/2019 1451   HGB 10.9 (L) 08/09/2019 1451   HGB 12.1 05/30/2017 1048   HCT 33.8 (L) 08/09/2019 1451   HCT 36.0 05/30/2017 1048   PLT 129 (L) 08/09/2019 1451   PLT 247 05/30/2017 1048   MCV 93.1 08/09/2019 1451   MCV 90 05/30/2017 1048   MCH 30.0 08/09/2019 1451   MCHC 32.2 08/09/2019 1451   RDW 15.9 (H) 08/09/2019 1451   RDW 16.5 (H) 05/30/2017 1048   LYMPHSABS 0.6 (L) 08/09/2019 1451   LYMPHSABS 0.8 05/30/2017 1048   MONOABS 0.6 08/09/2019 1451   EOSABS 0.1 08/09/2019 1451   EOSABS 0.0 05/30/2017 1048   BASOSABS 0.0 08/09/2019 1451   BASOSABS 0.0 05/30/2017 1048   -------------------------------  Imaging from last 24 hours (if applicable):  Radiology interpretation: No results found.

## 2019-08-09 NOTE — Patient Instructions (Signed)
Nosebleed, Adult A nosebleed is when blood comes out of the nose. Nosebleeds are common. Usually, they are not a sign of a serious condition. Nosebleeds can happen if a small blood vessel in your nose starts to bleed or if the lining of your nose (mucous membrane) cracks. They are commonly caused by:  Allergies.  Colds.  Picking your nose.  Blowing your nose too hard.  An injury from sticking an object into your nose or getting hit in the nose.  Dry or cold air. Less common causes of nosebleeds include:  Toxic fumes.  Something abnormal in the nose or in the air-filled spaces in the bones of the face (sinuses).  Growths in the nose, such as polyps.  Medicines or conditions that cause blood to clot slowly.  Certain illnesses or procedures that irritate or dry out the nasal passages. Follow these instructions at home: When you have a nosebleed:   Sit down and tilt your head slightly forward.  Use a clean towel or tissue to pinch your nostrils under the bony part of your nose. After 10 minutes, let go of your nose and see if bleeding starts again. Do not release pressure before that time. If there is still bleeding, repeat the pinching and holding for 10 minutes until the bleeding stops.  Do not place tissues or gauze in the nose to stop bleeding.  Avoid lying down and avoid tilting your head backward. That may make blood collect in the throat and cause gagging or coughing.  Use a nasal spray decongestant to help with a nosebleed as told by your health care provider.  Do not use petroleum jelly or mineral oil in your nose. It can drip into your lungs. After a nosebleed:  Avoid blowing your nose or sniffing for a number of hours.  Avoid straining, lifting, or bending at the waist for several days. You may resume other normal activities as you are able.  Use saline spray or a humidifier as told by your health care provider.  Aspirinand blood thinners make bleeding more  likely. If you are prescribed these medicines and you suffer from nosebleeds: ? Ask your health care provider if you should stop taking the medicines or if you should adjust the dose. ? Do not stop taking medicines that your health care provider has recommended unless told by your health care provider.  If your nosebleed was caused by dry mucous membranes, use over-the-counter saline nasal spray or gel. This will keep the mucous membranes moist and allow them to heal. If you must use a lubricant: ? Choose one that is water-soluble. ? Use only as much as you need and use it only as often as needed. ? Do not lie down until several hours after you use it. Contact a health care provider if:  You have a fever.  You get nosebleeds often or more often than usual.  You bruise very easily.  You have a nosebleed from having something stuck in your nose.  You have bleeding in your mouth.  You vomit or cough up brown material.  You have a nosebleed after you start a new medicine. Get help right away if:  You have a nosebleed after a fall or a head injury.  Your nosebleed does not go away after 20 minutes.  You feel dizzy or weak.  You have unusual bleeding from other parts of your body.  You have unusual bruising on other parts of your body.  You become sweaty.  You   vomit blood. This information is not intended to replace advice given to you by your health care provider. Make sure you discuss any questions you have with your health care provider. Document Revised: 03/29/2017 Document Reviewed: 07/15/2015 Elsevier Patient Education  2020 Elsevier Inc.  

## 2019-08-09 NOTE — Progress Notes (Signed)
cbc

## 2019-08-17 ENCOUNTER — Telehealth: Payer: Self-pay

## 2019-08-17 ENCOUNTER — Other Ambulatory Visit: Payer: Self-pay

## 2019-08-17 ENCOUNTER — Emergency Department (HOSPITAL_BASED_OUTPATIENT_CLINIC_OR_DEPARTMENT_OTHER)
Admission: EM | Admit: 2019-08-17 | Discharge: 2019-08-17 | Disposition: A | Discharge status: Home / Self Care | Payer: Medicare Other | Attending: Emergency Medicine | Admitting: Emergency Medicine

## 2019-08-17 ENCOUNTER — Emergency Department (HOSPITAL_BASED_OUTPATIENT_CLINIC_OR_DEPARTMENT_OTHER): Payer: Medicare Other

## 2019-08-17 ENCOUNTER — Encounter (HOSPITAL_BASED_OUTPATIENT_CLINIC_OR_DEPARTMENT_OTHER): Payer: Self-pay | Admitting: Emergency Medicine

## 2019-08-17 DIAGNOSIS — M79662 Pain in left lower leg: Secondary | ICD-10-CM | POA: Diagnosis present

## 2019-08-17 DIAGNOSIS — Z79899 Other long term (current) drug therapy: Secondary | ICD-10-CM | POA: Insufficient documentation

## 2019-08-17 DIAGNOSIS — N186 End stage renal disease: Secondary | ICD-10-CM | POA: Insufficient documentation

## 2019-08-17 DIAGNOSIS — L03116 Cellulitis of left lower limb: Secondary | ICD-10-CM | POA: Diagnosis not present

## 2019-08-17 DIAGNOSIS — I120 Hypertensive chronic kidney disease with stage 5 chronic kidney disease or end stage renal disease: Secondary | ICD-10-CM | POA: Insufficient documentation

## 2019-08-17 DIAGNOSIS — Z992 Dependence on renal dialysis: Secondary | ICD-10-CM | POA: Diagnosis not present

## 2019-08-17 DIAGNOSIS — I890 Lymphedema, not elsewhere classified: Secondary | ICD-10-CM

## 2019-08-17 LAB — CBC WITH DIFFERENTIAL/PLATELET
Abs Immature Granulocytes: 0.02 10*3/uL (ref 0.00–0.07)
Basophils Absolute: 0 10*3/uL (ref 0.0–0.1)
Basophils Relative: 1 %
Eosinophils Absolute: 0 10*3/uL (ref 0.0–0.5)
Eosinophils Relative: 1 %
HCT: 32.7 % — ABNORMAL LOW (ref 36.0–46.0)
Hemoglobin: 10.2 g/dL — ABNORMAL LOW (ref 12.0–15.0)
Immature Granulocytes: 1 %
Lymphocytes Relative: 12 %
Lymphs Abs: 0.5 10*3/uL — ABNORMAL LOW (ref 0.7–4.0)
MCH: 29.7 pg (ref 26.0–34.0)
MCHC: 31.2 g/dL (ref 30.0–36.0)
MCV: 95.3 fL (ref 80.0–100.0)
Monocytes Absolute: 0.4 10*3/uL (ref 0.1–1.0)
Monocytes Relative: 11 %
Neutro Abs: 2.9 10*3/uL (ref 1.7–7.7)
Neutrophils Relative %: 74 %
Platelets: 136 10*3/uL — ABNORMAL LOW (ref 150–400)
RBC: 3.43 MIL/uL — ABNORMAL LOW (ref 3.87–5.11)
RDW: 16.3 % — ABNORMAL HIGH (ref 11.5–15.5)
WBC: 3.9 10*3/uL — ABNORMAL LOW (ref 4.0–10.5)
nRBC: 0 % (ref 0.0–0.2)

## 2019-08-17 LAB — BASIC METABOLIC PANEL
Anion gap: 11 (ref 5–15)
BUN: 27 mg/dL — ABNORMAL HIGH (ref 8–23)
CO2: 29 mmol/L (ref 22–32)
Calcium: 9.2 mg/dL (ref 8.9–10.3)
Chloride: 98 mmol/L (ref 98–111)
Creatinine, Ser: 5.58 mg/dL — ABNORMAL HIGH (ref 0.44–1.00)
GFR calc Af Amer: 9 mL/min — ABNORMAL LOW (ref >60)
GFR calc non Af Amer: 8 mL/min — ABNORMAL LOW (ref >60)
Glucose, Bld: 94 mg/dL (ref 70–99)
Potassium: 4.5 mmol/L (ref 3.5–5.1)
Sodium: 138 mmol/L (ref 135–145)

## 2019-08-17 LAB — PROCALCITONIN: Procalcitonin: 1.19 ng/mL

## 2019-08-17 LAB — D-DIMER, QUANTITATIVE: D-Dimer, Quant: 0.51 ug/mL-FEU — ABNORMAL HIGH (ref 0.00–0.50)

## 2019-08-17 MED ORDER — CEPHALEXIN 500 MG PO CAPS: 500.0000 mg | ORAL_CAPSULE | Freq: Two times a day (BID) | ORAL | 0 refills | Status: DC

## 2019-08-17 MED ORDER — CEFAZOLIN SODIUM-DEXTROSE 1-4 GM/50ML-% IV SOLN
1.0000 g | Freq: Once | INTRAVENOUS | Status: AC
  Administered 2019-08-17: 1 g via INTRAVENOUS
  Filled 2019-08-17: qty 50

## 2019-08-17 NOTE — ED Provider Notes (Signed)
Signed out by Dr Florina Ou that pt has cellulitis left foot/lower leg, and to d/c to home if when u/s negative for dvt.   U/s is neg for dvt. Discussed w pt.   On recheck, vitals normal. Exam c/w cellulitis. Dp/pt palp, normal cap refill in toes. No ulcers/skin breakdown.  Abx completed.  Pt appears stable for d/c.   rx for home.   Return precautions provided.      Lajean Saver, MD 08/17/19 1020

## 2019-08-17 NOTE — ED Provider Notes (Signed)
Katie Nunez DEPT [facility] Provider Note: Katie Spurling, MD, FACEP  CSN: [id number] MRN: [id number] ARRIVAL: 08/17/19 at Center: [place]  Leg Pain   HISTORY OF PRESENT ILLNESS  08/17/19 6:16 AM Katie Nunez is a 64 y.o. female with a history of some type of mixed connective tissue disorder with scleroderma, pulmonary hypertension, Raynaud's disease, and chronic bilateral lower extremity lymphedema.  She has end-stage renal disease and is on hemodialysis, last session was yesterday.  Her husband wraps her legs when they become edematous.  He wrapped them several days ago and she took off the wraps yesterday evening.  She is now having pain in her left lower leg primarily on the medial aspect of the leg.  She rates her pain as a 9 out of 10, characterizes it as tenderness and soreness, worse with palpation.  She has not had a fever.  There is no associated erythema.   Past Medical History:  Diagnosis Date  . Achalasia   . Anxiety   . Dysplasia of cervix, low grade (CIN 1)   . Environmental allergies    "all year long" (12/27/2016)  . ESRD (end stage renal disease) on dialysis [facility])    "TTS; [place] [facility]" (12/27/2016)  . Fibromyalgia   . GERD (gastroesophageal reflux disease)   . Gout   . Hypertension   . IBS (irritable bowel syndrome)   . MVP (mitral valve prolapse)   . RA (rheumatoid arthritis) (HCC)    FOLLOWED BY DR. SHANAHAN  . Raynaud's disease   . Scleroderma ([place])   . Seasonal allergies   . Thrombocytopenia ([place]) 07/01/2016   Acute fall to 13,000 07/01/16  . Tubular adenoma 01/08/2008   CECUM  . Vitamin D deficiency     Past Surgical History:  Procedure Laterality Date  . ANKLE FRACTURE SURGERY Right   . AV FISTULA PLACEMENT Left 06/28/2016   Procedure: left arm ARTERIOVENOUS (AV) FISTULA CREATION;  Surgeon: Rosetta Posner, MD;  Location: [place];  Service: Vascular;  Laterality: Left;  . BASCILIC VEIN TRANSPOSITION Left 09/27/2016   Procedure:  LEFT UPPER ARM CEPHALIC VEIN TRANSPOSITION;  Surgeon: Rosetta Posner, MD;  Location: [place];  Service: Vascular;  Laterality: Left;  . BREAST BIOPSY     "? side"  . CESAREAN SECTION  1994  . CO2 LASER OF CERVIX    . COLONOSCOPY W/ BIOPSIES  01/08/2008  . INSERTION OF DIALYSIS CATHETER Right 06/28/2016   Procedure: INSERTION OF DIALYSIS CATHETER, right internal jugular;  Surgeon: Rosetta Posner, MD;  Location: [place];  Service: Vascular;  Laterality: Right;  . MYOMECTOMY    . PELVIC LAPAROSCOPY  2011  . superficial thrombophlebitis Left 07-2014    Family History  Problem Relation Age of Onset  . Hypertension Mother   . Diabetes Mother   . Heart disease Father   . Hypertension Maternal Aunt   . Diabetes Maternal Grandmother   . Heart disease Paternal Grandfather   . Cerebral palsy Cousin        1ST COUSIN?  . Diabetes Paternal Grandmother     Social History   Tobacco Use  . Smoking status: Never Smoker  . Smokeless tobacco: Never Used  Vaping Use  . Vaping Use: Never used  Substance Use Topics  . Alcohol use: No  . Drug use: No    Prior to Admission medications   Medication Sig Start Date End Date Taking? Authorizing Provider  acetaminophen (TYLENOL) 500 MG tablet  Take 500-1,000 mg by mouth every 6 (six) hours as needed (for pain/headaches.).     [provider]  albuterol (PROAIR HFA) 108 (90 Base) MCG/ACT inhaler Inhale 2 puffs into the lungs every 6 (six) hours as needed for wheezing or shortness of breath.  10/07/17   [provider]  ambrisentan (LETAIRIS) 5 MG tablet Take by mouth.    [provider]  calcium acetate (PHOSLO) 667 MG capsule Take 667 mg by mouth 5 (five) times daily. 06/22/19   [provider]  cephALEXin (KEFLEX) 500 MG capsule Take 1 capsule (500 mg total) by mouth 2 (two) times daily. On dialysis days, take dose after dialysis. 08/17/19   Lajean Saver, MD  cinacalcet (SENSIPAR) 30 MG tablet Take 30 mg by mouth at bedtime.      [provider]  clonazePAM (KLONOPIN) 0.5 MG tablet Take 0.5 mg by mouth 2 (two) times daily as needed for anxiety.     [provider]  clotrimazole-betamethasone (LOTRISONE) cream Apply 1 application topically 2 (two) times daily. Apply to itchy area under toes twice a day 05/07/19   Bronson Ing, DPM  cyclobenzaprine (FLEXERIL) 5 MG tablet Take 1 tablet (5 mg total) by mouth 3 (three) times daily as needed for muscle spasms. Patient taking differently: Take 10 mg by mouth 3 (three) times daily as needed for muscle spasms.  07/07/16   Theodis Blaze, MD  docusate sodium (COLACE) 100 MG capsule Take 2 capsules (200 mg total) by mouth 2 (two) times daily. Patient taking differently: Take 200 mg by mouth 2 (two) times daily as needed (for constipation).  07/07/16   Theodis Blaze, MD  doxycycline (VIBRA-TABS) 100 MG tablet Take 1 tablet (100 mg total) by mouth 2 (two) times daily. 07/23/19   Bronson Ing, DPM  ferric citrate (AURYXIA) 1 GM 210 MG(Fe) tablet Take 420 mg by mouth 3 (three) times daily with meals.     [provider]  fluticasone (FLONASE) 50 MCG/ACT nasal spray Place 1 spray into the nose daily. 12/15/09   [provider]  gabapentin (NEURONTIN) 100 MG capsule Take 100 mg by mouth daily as needed (pain).     [provider]  gentamicin ointment (GARAMYCIN) 0.1 % Apply to toe after soaking twice daily 06/25/19   Bronson Ing, DPM  hydrOXYzine (ATARAX/VISTARIL) 25 MG tablet Take 25 mg by mouth every 8 (eight) hours as needed. 06/22/19   [provider]  ipratropium (ATROVENT) 0.06 % nasal spray Place 2 sprays into both nostrils 4 (four) times daily.    [provider]  lactulose (CHRONULAC) 10 GM/15ML solution SMARTSIG:15 Milliliter(s) By Mouth 3-4 Times Daily 07/25/19   [provider]  levocetirizine (XYZAL) 5 MG tablet Take 5 mg by mouth every evening.    [provider]  loperamide  (IMODIUM) 2 MG capsule Take 2-4 mg by mouth 3 (three) times daily as needed for diarrhea or loose stools.    [provider]  metoprolol succinate (TOPROL-XL) 25 MG 24 hr tablet Take 12.5 mg by mouth daily. 11/14/18   [provider]  montelukast (SINGULAIR) 10 MG tablet Take 10 mg by mouth daily as needed (allergy symptoms).  08/27/16   [provider]  multivitamin (RENA-VIT) TABS tablet Take 1 tablet by mouth daily.  03/03/17   [provider]  pantoprazole (PROTONIX) 40 MG tablet TAKE 1 TABLET(40 MG) BY MOUTH DAILY 11/11/17   Byrum, Rose Fillers, MD  phenylephrine (NEO-SYNEPHRINE)  0.5 % nasal solution Place 1 drop into both nostrils every 6 (six) hours as needed for congestion. 07/07/16   Theodis Blaze, MD  Selexipag (UPTRAVI) 200 MCG TABS 600 mg 2 (two) times daily 09/19/18   [provider]  sevelamer carbonate (RENVELA) 800 MG tablet Take by mouth. 11/24/17   [provider]  Spacer/Aero-Holding Josiah Lobo DEVI Use spacer with albuterol 11/11/17   Collene Gobble, MD  TADALAFIL PO Take 20 mg by mouth daily.     [provider]  tadalafil, PAH, (ADCIRCA) 20 MG tablet Take 20 mg by mouth every other day. 08/03/19   [provider]    Allergies Elwyn Reach hcl] and Tape   REVIEW OF SYSTEMS  Negative except as noted here or in the History of Present Illness.   PHYSICAL EXAMINATION  Initial Vital Signs Blood pressure 126/69, pulse 89, temperature 99.4 F (37.4 C), temperature source Oral, resp. rate 18, height 5\' 5"  (1.651 m), weight 68.9 kg, last menstrual period 11/15/2008, SpO2 90 %.  Examination General: Well-developed, well-nourished female in no acute distress; appearance consistent with age of record HENT: normocephalic; atraumatic Eyes: pupils equal, round and reactive to light; extraocular muscles intact Neck: supple Heart: regular rate and rhythm Lungs: clear to auscultation bilaterally Abdomen: soft;  nondistended; nontender; bowel sounds present Extremities: No deformity; dialysis fistula left upper arm with pulse and thrill; DP and PT pulses +1 bilaterally; chronic appearing edema and hyperpigmentation of lower legs with tenderness and warmth of left lower leg without erythema:    Neurologic: Awake, alert and oriented; motor function intact in all extremities and symmetric; no facial droop Skin: Warm and dry Psychiatric: Normal mood and affect   RESULTS  Summary of this visit's results, reviewed and interpreted by myself:   EKG Interpretation  Date/Time:    Ventricular Rate:    PR Interval:    QRS Duration:   QT Interval:    QTC Calculation:   R Axis:     Text Interpretation:        Laboratory Studies: No results found for this or any previous visit (from the past 24 hour(s)). Imaging Studies: US Venous Img Lower Unilateral Left  Result Date: 08/17/2019 CLINICAL DATA:  Left lower extremity swelling EXAM: LEFT LOWER EXTREMITY VENOUS DOPPLER ULTRASOUND TECHNIQUE: Gray-scale sonography with compression, as well as color and duplex ultrasound, were performed to evaluate the deep venous system(s) from the level of the common femoral vein through the popliteal and proximal calf veins. COMPARISON:  None. FINDINGS: VENOUS Normal compressibility of the common femoral, superficial femoral, and popliteal veins, as well as the visualized calf veins. Visualized portions of profunda femoral vein and great saphenous vein unremarkable. No filling defects to suggest DVT on grayscale or color Doppler imaging. Doppler waveforms show normal direction of venous flow, normal respiratory plasticity and response to augmentation. Limited views of the contralateral common femoral vein are unremarkable. OTHER Increased pulsatility of the venous waveforms. Limitations: none IMPRESSION: 1. No evidence of deep or superficial venous thrombosis. 2. Increased pulsatility of the venous waveforms suggests elevated  right heart pressure. Differential considerations include tricuspid regurgitation, right-sided heart failure, COPD and pulmonary arterial hypertension. Electronically Signed   By: Jacqulynn Cadet M.D.   On: 08/17/2019 09:39    ED COURSE and MDM  Nursing notes, initial and subsequent vitals signs, including pulse oximetry, reviewed and interpreted by myself.  Vitals:   08/17/19 0256 08/17/19 0713 08/17/19 0952 08/17/19 1038  BP: 126/69 112/64 134/67 123/78  Pulse: 89 83 84 74  Resp: 18 18 18 16   Temp: 99.4 F (37.4 C)   98.8 F (37.1 C)  TempSrc: Oral   Oral  SpO2: 90% 100% 100% 99%  Weight:      Height:       Medications  ceFAZolin (ANCEF) IVPB 1 g/50 mL premix (0 g Intravenous Stopped 08/17/19 0839)   7:28 AM Patient given Ancef 1 g for possible left lower extremity cellulitis.  Doppler ultrasound of left lower extremity pending.  Signed out to Dr. Ashok Cordia.   PROCEDURES  Procedures   ED DIAGNOSES     ICD-10-CM   1. Cellulitis of left lower leg  L03.[place]        Yatziry Deakins, MD 08/18/19 1229

## 2019-08-17 NOTE — ED Notes (Addendum)
C/o left lower leg pain that started yesterday after she removed a "wrap" to her lower leg that helps with swelling. Swelling noted to lower extremites bilateral 4 plus left and 3 plus right.  Pt is a dialysis pt. T-Th-Sat. Graft to her left arm with pos thrill and bruit. Denies c/p or sob. resp even and unlabored. 02 at  2l n/c. Pt states she wears home oxygen.

## 2019-08-17 NOTE — ED Notes (Signed)
Patient transported to us 

## 2019-08-17 NOTE — Telephone Encounter (Signed)
Patient called on 08/13/19 to restart her sessions with an Occupational Therapist to manage her Lymphedema.  I tried reaching patient for more information and she did call back and I spoke to her on 08/17/19.    She said she used to go to [facility] but had to stop for a while.  She would like to restart this.  An order for OT was sent again to [facility] yesterday 08/17/19.  Thurston Hole., LPN

## 2019-08-17 NOTE — Discharge Instructions (Signed)
It was our pleasure to provide your ER care today - we hope that you feel better.  Take keflex (antibiotic) as prescribed. On your dialysis days, take dose after dialysis.   Elevate leg to help with swelling and soreness. Keep skin very clean. If dry/cracked skin, use gentle moisturizer such as Eucerin, Dove, or Aveeno.  Take acetaminophen as need.   Return to ER right away if worse, new symptoms, high fevers, spreading redness, worsening or severe swelling, weak/faint, or other concern.

## 2019-08-17 NOTE — ED Notes (Signed)
MD with pt  

## 2019-08-17 NOTE — ED Triage Notes (Signed)
Pt is c/o left leg pain  Pt states the pain started tonight after she took the wraps off of her legs she wears for lymphedema Pt states the pain is on the inside of her leg from the knee down

## 2019-08-22 ENCOUNTER — Telehealth: Payer: Self-pay | Admitting: Internal Medicine

## 2019-08-22 ENCOUNTER — Other Ambulatory Visit: Payer: Self-pay

## 2019-08-22 ENCOUNTER — Encounter: Payer: Self-pay | Admitting: Internal Medicine

## 2019-08-22 ENCOUNTER — Telehealth: Payer: Self-pay | Admitting: *Deleted

## 2019-08-22 ENCOUNTER — Telehealth (INDEPENDENT_AMBULATORY_CARE_PROVIDER_SITE_OTHER): Payer: Medicare Other | Admitting: Internal Medicine

## 2019-08-22 VITALS — BP 108/66 | HR 81 | Ht 65.0 in | Wt 152.0 lb

## 2019-08-22 DIAGNOSIS — R000 Tachycardia, unspecified: Secondary | ICD-10-CM

## 2019-08-22 DIAGNOSIS — I272 Pulmonary hypertension, unspecified: Secondary | ICD-10-CM

## 2019-08-22 DIAGNOSIS — B349 Viral infection, unspecified: Secondary | ICD-10-CM | POA: Insufficient documentation

## 2019-08-22 NOTE — Progress Notes (Signed)
Electrophysiology TeleHealth Note   Due to national recommendations of social distancing due to [place] 19, Audio/video telehealth visit is felt to be most appropriate for this patient at this time.  See MyChart message from today for patient consent regarding telehealth for [facility].   Date:  08/22/2019   ID:  Katie Nunez, DOB 11-26-1955, MRN [id number]  Location: home Provider location: [place] Evaluation Performed: New patient consult  PCP:  [name], MD  Cardiologist:  Dr Gilles Chiquito at [facility] Electrophysiologist:  None   Chief Complaint:  palpitations  History of Present Illness:    Katie Nunez is a 64 y.o. female who presents via audio/video conferencing for a telehealth visit today.   The patient is referred for new consultation regarding tachycardia by Dr Gilles Chiquito.  She has scleroderma, RA, pulmonary htn, cirrhosis and ESRD. She is on dialysis and has been found to have episodic heart racing.  She reports that her heart racing is worsened with pain or activity.  She reports that she has substantial constipation and strains a lot.  She has anal fissures and is planned to have further GI evaluation and management. She thinks that this is the cause for her tachycardia. She recently wore an event monitor which showed frequent sinus tachycardia, PACs, and episodes of SVT. She has been placed on metoprolol however further up titration is limited by hypotension.  She notices tachycardia occasionally but is mostly not bothered by this.  Today, she denies symptoms of chest pain, shortness of breath, orthopnea, PND,  dizziness, presyncope, syncope, bleeding, or neurologic sequela  She has lymphedema chronically. . The patient is tolerating medications without difficulties and is otherwise without complaint today.     Past Medical History:  Diagnosis Date  . Achalasia   . Anxiety   . Dysplasia of cervix, low grade (CIN 1)   . Environmental allergies    "all  year long" (12/27/2016)  . ESRD (end stage renal disease) on dialysis [facility])    "TTS; [place]" (12/27/2016)  . Fibromyalgia   . GERD (gastroesophageal reflux disease)   . Gout   . Hypertension   . IBS (irritable bowel syndrome)   . MVP (mitral valve prolapse)   . RA (rheumatoid arthritis) (HCC)    FOLLOWED BY DR. SHANAHAN  . Raynaud's disease   . Scleroderma ([place])   . Seasonal allergies   . Thrombocytopenia ([place]) 07/01/2016   Acute fall to 13,000 07/01/16  . Tubular adenoma 01/08/2008   CECUM  . Vitamin D deficiency     Past Surgical History:  Procedure Laterality Date  . ANKLE FRACTURE SURGERY Right   . AV FISTULA PLACEMENT Left 06/28/2016   Procedure: left arm ARTERIOVENOUS (AV) FISTULA CREATION;  Surgeon: Rosetta Posner, MD;  Location: [place];  Service: Vascular;  Laterality: Left;  . BASCILIC VEIN TRANSPOSITION Left 09/27/2016   Procedure: LEFT UPPER ARM CEPHALIC VEIN TRANSPOSITION;  Surgeon: Rosetta Posner, MD;  Location: [place];  Service: Vascular;  Laterality: Left;  . BREAST BIOPSY     "? side"  . CESAREAN SECTION  1994  . CO2 LASER OF CERVIX    . COLONOSCOPY W/ BIOPSIES  01/08/2008  . INSERTION OF DIALYSIS CATHETER Right 06/28/2016   Procedure: INSERTION OF DIALYSIS CATHETER, right internal jugular;  Surgeon: Rosetta Posner, MD;  Location: [place];  Service: Vascular;  Laterality: Right;  . MYOMECTOMY    . PELVIC LAPAROSCOPY  2011  . superficial thrombophlebitis Left 07-2014  Current Outpatient Medications  Medication Sig Dispense Refill  . acetaminophen (TYLENOL) 500 MG tablet Take 500-1,000 mg by mouth every 6 (six) hours as needed (for pain/headaches.).     Marland Kitchen ambrisentan (LETAIRIS) 5 MG tablet Take by mouth.    . calcium acetate (PHOSLO) 667 MG capsule Take 667 mg by mouth 5 (five) times daily.    . cephALEXin (KEFLEX) 500 MG capsule Take 1 capsule (500 mg total) by mouth 2 (two) times daily. On dialysis days, take dose after dialysis. 28 capsule 0  . cinacalcet  (SENSIPAR) 30 MG tablet Take 30 mg by mouth at bedtime.     . clonazePAM (KLONOPIN) 0.5 MG tablet Take 0.5 mg by mouth 2 (two) times daily as needed for anxiety.     . clotrimazole-betamethasone (LOTRISONE) cream Apply 1 application topically 2 (two) times daily. Apply to itchy area under toes twice a day 45 g 2  . cyclobenzaprine (FLEXERIL) 5 MG tablet Take 1 tablet (5 mg total) by mouth 3 (three) times daily as needed for muscle spasms. (Patient taking differently: Take 10 mg by mouth 3 (three) times daily as needed for muscle spasms. ) 30 tablet 0  . docusate sodium (COLACE) 100 MG capsule Take 2 capsules (200 mg total) by mouth 2 (two) times daily. (Patient taking differently: Take 200 mg by mouth 2 (two) times daily as needed (for constipation). ) 10 capsule 0  . ferric citrate (AURYXIA) 1 GM 210 MG(Fe) tablet Take 420 mg by mouth 3 (three) times daily with meals.     . fluticasone (FLONASE) 50 MCG/ACT nasal spray Place 1 spray into the nose daily.    Marland Kitchen gabapentin (NEURONTIN) 100 MG capsule Take 100 mg by mouth daily as needed (pain).     Marland Kitchen gentamicin ointment (GARAMYCIN) 0.1 % Apply to toe after soaking twice daily 30 g 0  . hydrOXYzine (ATARAX/VISTARIL) 25 MG tablet Take 25 mg by mouth every 8 (eight) hours as needed.    Marland Kitchen ipratropium (ATROVENT) 0.06 % nasal spray Place 2 sprays into both nostrils 4 (four) times daily.    Marland Kitchen lactulose (CHRONULAC) 10 GM/15ML solution SMARTSIG:15 Milliliter(s) By Mouth 3-4 Times Daily    . levocetirizine (XYZAL) 5 MG tablet Take 5 mg by mouth every evening.    . loperamide (IMODIUM) 2 MG capsule Take 2-4 mg by mouth 3 (three) times daily as needed for diarrhea or loose stools.    . metoprolol succinate (TOPROL-XL) 25 MG 24 hr tablet Take 12.5 mg by mouth daily.    . montelukast (SINGULAIR) 10 MG tablet Take 10 mg by mouth daily as needed (allergy symptoms).   1  . multivitamin (RENA-VIT) TABS tablet Take 1 tablet by mouth daily.     . pantoprazole (PROTONIX) 40  MG tablet TAKE 1 TABLET(40 MG) BY MOUTH DAILY 90 tablet 1  . phenylephrine (NEO-SYNEPHRINE) 0.5 % nasal solution Place 1 drop into both nostrils every 6 (six) hours as needed for congestion. 15 mL 1  . Selexipag (UPTRAVI) 200 MCG TABS 600 mg 2 (two) times daily    . sevelamer carbonate (RENVELA) 800 MG tablet Take by mouth.    . Spacer/Aero-Holding Chambers DEVI Use spacer with albuterol 1 each 0  . TADALAFIL PO Take 20 mg by mouth daily.     . tadalafil, PAH, (ADCIRCA) 20 MG tablet Take 20 mg by mouth every other day.     No current facility-administered medications for this visit.    Allergies:   Elwyn Reach  hcl] and Tape   Social History:  The patient  reports that she has never smoked. She has never used smokeless tobacco. She reports that she does not drink alcohol and does not use drugs.   Family History:  The patient's family history includes Cerebral palsy in her cousin; Diabetes in her maternal grandmother, mother, and paternal grandmother; Heart disease in her father and paternal grandfather; Hypertension in her maternal aunt and mother.    ROS:  Please see the history of present illness.   All other systems are personally reviewed and negative.    Exam:    Vital Signs:  BP 108/66   Pulse 81   Ht 5\' 5"  (1.651 m)   Wt 152 lb (68.9 kg)   LMP 11/15/2008   SpO2 97%   BMI 25.29 kg/m   Well appearing, alert and conversant, regular work of breathing,  good skin color Eyes- anicteric, neuro- grossly intact, skin- no apparent rash or lesions or cyanosis, mouth- oral mucosa is pink   Labs/Other Tests and Data Reviewed:    Recent Labs: 08/17/2019: BUN 27; Creatinine, Ser 5.58; Hemoglobin 10.2; Platelets 136; Potassium 4.5; Sodium 138   Wt Readings from Last 3 Encounters:  08/22/19 152 lb (68.9 kg)  08/17/19 152 lb (68.9 kg)  08/09/19 152 lb 8 oz (69.2 kg)    Echo 06/27/19- preserved EF with LVH, moderate TR  Other studies personally reviewed: Additional studies/  records that were reviewed today include: prior echo, records from primary care, and ekgs  Review of the above records today demonstrates: as above   ASSESSMENT & PLAN:    1.  Tachycardia Likely multifactorial I have reviewed a number of records from Dr Sharion Balloon office, including her recent holter monitor.  I will request the actual strips to review. It appear that her primary arrhythmia is sinus tachycardia, though she does by report have PACs and SVT also. This is likely secondary to her multiple underlying health issues. She has been treated with metoprolol however this treatment is limited by hypotension with HD. She is mostly asymptomatic.   I will request TFTs and also her actual monitor to review.  I will reserve any additional treatment decisions until this information is available.  Of note, she has had marked LA enlargement on prior echoes which probably predispose her to atrial arrhythmias. Her AAD options would be limited due to her multiple comoribidities.   Follow-up:  2 months with me  Patient Risk:  after full review of this patients clinical status, I feel that they are at moderate risk at this time.  Very complicated patient.  A high level of decision making was required for this encounter. Today, I have spent 30 minutes with the patient with telehealth technology discussing tachycardia .    Signed, Thompson Grayer MD, [place] 08/22/2019 9:18 AM   [facility] [address] [place] [address] [phone] (office) [phone] (fax)

## 2019-08-22 NOTE — Telephone Encounter (Addendum)
Patient calling to inform that the last time she had her thyroid tested was in 2012. She states her rheumatologist ordered the lab work for her in [place] and as soon as the results are reserved she'll have them fax it to our office.

## 2019-08-22 NOTE — Telephone Encounter (Signed)
  Patient Consent for Virtual Visit         Katie Nunez has provided verbal consent on 08/22/2019 for a virtual visit (video or telephone).   CONSENT FOR VIRTUAL VISIT FOR:  Katie Nunez  By participating in this virtual visit I agree to the following:  I hereby voluntarily request, consent and authorize [facility] and its employed or contracted physicians, physician assistants, nurse practitioners or other licensed health care professionals (the Practitioner), to provide me with telemedicine health care services (the "Services") as deemed necessary by the treating Practitioner. I acknowledge and consent to receive the Services by the Practitioner via telemedicine. I understand that the telemedicine visit will involve communicating with the Practitioner through live audiovisual communication technology and the disclosure of certain medical information by electronic transmission. I acknowledge that I have been given the opportunity to request an in-person assessment or other available alternative prior to the telemedicine visit and am voluntarily participating in the telemedicine visit.  I understand that I have the right to withhold or withdraw my consent to the use of telemedicine in the course of my care at any time, without affecting my right to future care or treatment, and that the Practitioner or I may terminate the telemedicine visit at any time. I understand that I have the right to inspect all information obtained and/or recorded in the course of the telemedicine visit and may receive copies of available information for a reasonable fee.  I understand that some of the potential risks of receiving the Services via telemedicine include:  Marland Kitchen Delay or interruption in medical evaluation due to technological equipment failure or disruption; . Information transmitted may not be sufficient (e.g. poor resolution of images) to allow for appropriate medical decision making by the  Practitioner; and/or  . In rare instances, security protocols could fail, causing a breach of personal health information.  Furthermore, I acknowledge that it is my responsibility to provide information about my medical history, conditions and care that is complete and accurate to the best of my ability. I acknowledge that Practitioner's advice, recommendations, and/or decision may be based on factors not within their control, such as incomplete or inaccurate data provided by me or distortions of diagnostic images or specimens that may result from electronic transmissions. I understand that the practice of medicine is not an exact science and that Practitioner makes no warranties or guarantees regarding treatment outcomes. I acknowledge that a copy of this consent can be made available to me via my patient portal ([facility]), or I can request a printed copy by calling the office of [facility].    I understand that my insurance will be billed for this visit.   I have read or had this consent read to me. . I understand the contents of this consent, which adequately explains the benefits and risks of the Services being provided via telemedicine.  . I have been provided ample opportunity to ask questions regarding this consent and the Services and have had my questions answered to my satisfaction. . I give my informed consent for the services to be provided through the use of telemedicine in my medical care

## 2019-08-22 NOTE — Telephone Encounter (Signed)
    Pt said she called her pcp and they checked the last time her thyroid was check it was in 2012, she wanted to know if Dr. Rayann Heman will order one for her or if she needs to get it from her pcp.

## 2019-08-27 ENCOUNTER — Other Ambulatory Visit: Payer: Self-pay

## 2019-08-27 ENCOUNTER — Telehealth: Payer: Self-pay | Admitting: Podiatrist

## 2019-08-27 ENCOUNTER — Ambulatory Visit (INDEPENDENT_AMBULATORY_CARE_PROVIDER_SITE_OTHER): Payer: Medicare Other | Admitting: Podiatrist

## 2019-08-27 ENCOUNTER — Other Ambulatory Visit: Payer: Self-pay | Admitting: Internal Medicine

## 2019-08-27 ENCOUNTER — Encounter: Payer: Medicare Other | Attending: Cardiovascular Disease

## 2019-08-27 ENCOUNTER — Encounter: Payer: Self-pay | Admitting: Podiatrist

## 2019-08-27 VITALS — Temp 98.0°F

## 2019-08-27 DIAGNOSIS — I272 Pulmonary hypertension, unspecified: Secondary | ICD-10-CM

## 2019-08-27 DIAGNOSIS — T7840XA Allergy, unspecified, initial encounter: Secondary | ICD-10-CM | POA: Insufficient documentation

## 2019-08-27 DIAGNOSIS — T782XXA Anaphylactic shock, unspecified, initial encounter: Secondary | ICD-10-CM | POA: Insufficient documentation

## 2019-08-27 DIAGNOSIS — L03116 Cellulitis of left lower limb: Secondary | ICD-10-CM

## 2019-08-27 DIAGNOSIS — J328 Other chronic sinusitis: Secondary | ICD-10-CM

## 2019-08-27 DIAGNOSIS — L97321 Non-pressure chronic ulcer of left ankle limited to breakdown of skin: Secondary | ICD-10-CM | POA: Diagnosis not present

## 2019-08-27 NOTE — Progress Notes (Signed)
Virtual Visit completed. Patient informed on EP and RD appointment and 6 Minute walk test. Patient also informed of patient health questionnaires on My Chart. Patient Verbalizes understanding. Visit diagnosis can be found in [facility] 06/27/2019.

## 2019-08-27 NOTE — Patient Instructions (Signed)
Put a couple drops of peroxide on the ulcer and wait 30 seconds.  Then soak in epsom salts or vinegar for 10-20 minutes.  Then remove and dry well and apply clear ointment .    Soak Instructions    THE DAY AFTER THE PROCEDURE  Place 1/4 cup of epsom salts in a quart of warm tap water.  Submerge your foot or feet with outer bandage intact for the initial soak; this will allow the bandage to become moist and wet for easy lift off.  Once you remove your bandage, continue to soak in the solution for 20 minutes.  This soak should be done twice a day.  Next, remove your foot or feet from solution, blot dry the affected area and cover.  Apply polysporin or neosporin.   You may use a band aid large enough to cover the area or use gauze and tape.     IF YOUR SKIN BECOMES IRRITATED WHILE USING THESE INSTRUCTIONS, IT IS OKAY TO SWITCH TO  antibacterial soap pump soap (Dial)  and water to keep the toe clean instead of soaking in epsom salts.

## 2019-08-27 NOTE — Telephone Encounter (Signed)
t called and would like a recommendation of something to help with her arches

## 2019-08-28 NOTE — Telephone Encounter (Signed)
Sure-  superfeet inserts are good-  they are sold at [employer] or [employer].  Another good option is a [employer] insole-  I would imagine they carry them at the [facility] store at [facility] but Im not certain.    Thank you!!

## 2019-08-30 ENCOUNTER — Other Ambulatory Visit (HOSPITAL_COMMUNITY): Payer: Medicare Other

## 2019-08-31 NOTE — Progress Notes (Signed)
Chief Complaint  Patient presents with  . Nail Check    Follow-up; Right foot; Hallux-lateral side; pt stated, "My nail feels better; but having pain in the toe; went to the ER a week ago; thought I had a blood clot; does not have"     HPI: Patient is 63 y.o. female who presents today for the concerns as listed above. She relates the right great toenail is much improved since her last visit where we did a simple incision and drainage.  She has had no more trouble out of that nail.  However, now her left leg is swollen and she has a spot on her left foot she is concerned about.  She was seen at the ER and was diagnosed with cellulitis of the left leg and is currently on antibiotics.    Patient Active Problem List   Diagnosis Date Noted  . Viral disease 08/22/2019  . Chronic right-sided heart failure ([place]) 07/09/2019  . Supraventricular tachycardia ([place]) 07/09/2019  . Other cirrhosis of liver ([place]) 06/27/2019  . Heart failure ([place]) 02/21/2019  . Heart palpitations 08/14/2018  . Other fluid overload 07/27/2018  . Encounter for long-term (current) use of other medications 01/18/2018  . Hypothyroidism 01/18/2018  . Myalgia and myositis 01/18/2018  . Chronic nephritis 01/18/2018  . Other interstitial pulmonary diseases with fibrosis in diseases classified elsewhere 01/18/2018  . Other long term (current) drug therapy 01/18/2018  . Sleep apnea 01/18/2018  . Unspecified persistent mental disorders due to conditions classified elsewhere 01/18/2018  . Venous reflux 01/18/2018  . Vitamin B12 deficiency 01/18/2018  . Vitamin D deficiency 01/18/2018  . Obstructive lung disease ([place]) 12/30/2017  . Other pruritus 12/27/2017  . Eruption cyst 12/19/2017  . 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 Chronic cough 11/11/2017  . Pulmonary hypertension ([place]) 11/11/2017  . Pulmonary arterial hypertension ([place]) 10/07/2017  . Acute ITP ([place]) 01/05/2017    . ESRD (end stage renal disease) ([place]) 12/28/2016  . Encounter for removal of sutures 09/09/2016  . Coagulation defect, unspecified ([place]) 09/01/2016  . Underimmunization status 08/05/2016  . Hemolytic anemia ([place]) 07/26/2016  . Epistaxis, recurrent 07/26/2016  . Unspecified protein-calorie malnutrition ([place]) 07/13/2016  . Aftercare including intermittent dialysis ([place]) 07/07/2016  . Anemia in chronic kidney disease 07/07/2016  . Hypokalemia 07/07/2016  . Iron deficiency anemia, unspecified 07/07/2016  . 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 Nonrheumatic mitral (valve) prolapse 07/07/2016  . Other irritable bowel syndrome 07/07/2016  . Other secondary thrombocytopenia 07/07/2016  . Secondary hyperparathyroidism of renal origin ([place]) 07/07/2016  . Thrombocytopenia ([place]) 07/01/2016  . ARF (acute renal failure) ([place]) 06/25/2016  . Anxiety 06/25/2016  . Bilateral carpal tunnel syndrome 10/22/2015  . Chronic gout without tophus 10/22/2015  . Chronic nonintractable headache 10/08/2015  . Fibroid uterus 01/03/2012  . H/O vitamin D deficiency 01/03/2012  . Post-menopausal 01/03/2012  . Hereditary and idiopathic peripheral neuropathy 11/19/2011  . Intestinal malabsorption 11/19/2011  . Lichen planus 67/67/2094  . Low back pain 11/19/2011  . Diffuse spasm of esophagus 11/11/2011  . ESR raised 11/11/2011  . Postinflammatory pulmonary fibrosis ([place]) 11/11/2011  . Scleroderma ([place]) 11/16/2010  . Rheumatoid arthritis ([place]) 11/16/2010  . Raynaud's disease 11/16/2010  . Symptomatic menopausal or female climacteric states 11/16/2010    Current Outpatient Medications on File Prior to Visit  Medication Sig Dispense Refill  . acetaminophen (TYLENOL) 500 MG tablet Take 500-1,000 mg by mouth every 6 (six) hours as needed (for pain/headaches.).     Marland Kitchen  ambrisentan (LETAIRIS) 5 MG tablet Take by mouth.    . calcium acetate (PHOSLO) 667 MG capsule Take 667 mg by mouth 5 (five) times daily.    .  cephALEXin (KEFLEX) 500 MG capsule Take 1 capsule (500 mg total) by mouth 2 (two) times daily. On dialysis days, take dose after dialysis. 28 capsule 0  . cinacalcet (SENSIPAR) 30 MG tablet Take 30 mg by mouth at bedtime.     . clonazePAM (KLONOPIN) 0.5 MG tablet Take 0.5 mg by mouth 2 (two) times daily as needed for anxiety.     . clotrimazole-betamethasone (LOTRISONE) cream Apply 1 application topically 2 (two) times daily. Apply to itchy area under toes twice a day 45 g 2  . cyclobenzaprine (FLEXERIL) 5 MG tablet Take 1 tablet (5 mg total) by mouth 3 (three) times daily as needed for muscle spasms. (Patient taking differently: Take 10 mg by mouth 3 (three) times daily as needed for muscle spasms. ) 30 tablet 0  . docusate sodium (COLACE) 100 MG capsule Take 2 capsules (200 mg total) by mouth 2 (two) times daily. (Patient taking differently: Take 200 mg by mouth 2 (two) times daily as needed (for constipation). ) 10 capsule 0  . ferric citrate (AURYXIA) 1 GM 210 MG(Fe) tablet Take 420 mg by mouth 3 (three) times daily with meals.     . fluticasone (FLONASE) 50 MCG/ACT nasal spray Place 1 spray into the nose daily.    Marland Kitchen gabapentin (NEURONTIN) 100 MG capsule Take 100 mg by mouth daily as needed (pain).     Marland Kitchen gentamicin ointment (GARAMYCIN) 0.1 % Apply to toe after soaking twice daily 30 g 0  . hydrOXYzine (ATARAX/VISTARIL) 25 MG tablet Take 25 mg by mouth every 8 (eight) hours as needed.    Marland Kitchen ipratropium (ATROVENT) 0.06 % nasal spray Place 2 sprays into both nostrils 4 (four) times daily. (Patient not taking: Reported on 08/27/2019)    . lactulose (CHRONULAC) 10 GM/15ML solution SMARTSIG:15 Milliliter(s) By Mouth 3-4 Times Daily    . levocetirizine (XYZAL) 5 MG tablet Take 5 mg by mouth every evening.    . loperamide (IMODIUM) 2 MG capsule Take 2-4 mg by mouth 3 (three) times daily as needed for diarrhea or loose stools.    . metoprolol succinate (TOPROL-XL) 25 MG 24 hr tablet Take 12.5 mg by mouth  daily.    . metoprolol tartrate (LOPRESSOR) 25 MG tablet Take by mouth. (Patient not taking: Reported on 08/27/2019)    . montelukast (SINGULAIR) 10 MG tablet Take 10 mg by mouth daily as needed (allergy symptoms).  (Patient not taking: Reported on 08/27/2019)  1  . multivitamin (RENA-VIT) TABS tablet Take 1 tablet by mouth daily.  (Patient not taking: Reported on 08/27/2019)    . pantoprazole (PROTONIX) 40 MG tablet TAKE 1 TABLET(40 MG) BY MOUTH DAILY (Patient not taking: Reported on 08/27/2019) 90 tablet 1  . phenylephrine (NEO-SYNEPHRINE) 0.5 % nasal solution Place 1 drop into both nostrils every 6 (six) hours as needed for congestion. 15 mL 1  . Selexipag (UPTRAVI) 200 MCG TABS 600 mg 2 (two) times daily    . sevelamer carbonate (RENVELA) 800 MG tablet Take by mouth.    . Spacer/Aero-Holding Chambers DEVI Use spacer with albuterol 1 each 0  . TADALAFIL PO Take 20 mg by mouth daily.     . tadalafil, PAH, (ADCIRCA) 20 MG tablet Take 20 mg by mouth every other day.     No current facility-administered medications on  file prior to visit.    Allergies  Allergen Reactions  . Savella [Milnacipran Hcl] Palpitations and Other (See Comments)    Unknown  . Tape Rash and Other (See Comments)    Itch- unsure if it was paper or adhesive tape    Review of Systems No fevers, chills, nausea, muscle aches, no difficulty breathing, no calf pain, no chest pain or shortness of breath.   Physical Exam  Neurovascular status is unchanged with palpable pedal pulses and neurological sensation intact.   Right hallux nail appears to be healing well. No redness, no proximal nail fold edema or calor noted. No pus or pirulence expressed.  The left leg is swollen and the skin is shiny. There some calor- no redness- no streaking noted.  The left foot has a small dark lesion on the medial heel / proximal arch region. It is dark and dry and appears to be varicose vein that perhaps was scraped. No pus or pirulence is  seen with debridement- only old blood.  See pic. The ulcer is 75m x 437mx 56m44meep.        Assessment     ICD-10-CM   1. Cellulitis of left leg  L03.116   2. Skin ulcer of left ankle, limited to breakdown of skin ([id number]      Plan  Carefully pared the lesion with a 15 blade and only dried blood was encountered.  I applied silvadene cream and a dressing to the area.  Gave instructions for soaks in epsom salts or soapy water to try and loosen the blood clot gently.  She will apply the gentamycin ointment and cover after soaks.  She will continue taking the antibiotic orally and I will see her back in 1-2 weeks for a recheck.  She is instructed to call to be see or go to the ER if the swelling in the left leg gets worse or if she notices the leg get red or inflammed.

## 2019-09-03 DIAGNOSIS — M65321 Trigger finger, right index finger: Secondary | ICD-10-CM | POA: Insufficient documentation

## 2019-09-03 DIAGNOSIS — R52 Pain, unspecified: Secondary | ICD-10-CM | POA: Insufficient documentation

## 2019-09-06 ENCOUNTER — Telehealth: Payer: Self-pay

## 2019-09-06 NOTE — Telephone Encounter (Signed)
Pt called with c/o L leg pain, "tight and warm". She had been in the ED a few weeks ago with cellulitis of this leg. She states she completed abx and it got better and then started to get worse again this week. I have advised her to call her PCP and make them aware. She verbalized understanding and is going to try to get in to see them today and will call us back if she needs anything else.

## 2019-09-10 ENCOUNTER — Ambulatory Visit: Payer: Medicare Other | Admitting: Podiatry

## 2019-09-10 ENCOUNTER — Ambulatory Visit: Payer: Medicare Other

## 2019-09-10 DIAGNOSIS — K6289 Other specified diseases of anus and rectum: Secondary | ICD-10-CM | POA: Insufficient documentation

## 2019-09-10 DIAGNOSIS — R159 Full incontinence of feces: Secondary | ICD-10-CM | POA: Insufficient documentation

## 2019-09-12 ENCOUNTER — Other Ambulatory Visit: Payer: Medicare Other

## 2019-09-19 ENCOUNTER — Inpatient Hospital Stay: Payer: Medicare Other

## 2019-09-19 ENCOUNTER — Inpatient Hospital Stay: Payer: Medicare Other | Attending: Medical | Admitting: Oncology

## 2019-09-19 ENCOUNTER — Ambulatory Visit: Payer: Medicare Other | Admitting: Occupational Therapy

## 2019-09-21 ENCOUNTER — Encounter: Payer: Medicare Other | Admitting: Occupational Therapy

## 2019-09-24 ENCOUNTER — Encounter: Payer: Medicare Other | Admitting: Occupational Therapy

## 2019-09-26 ENCOUNTER — Encounter: Payer: Medicare Other | Admitting: Occupational Therapy

## 2019-10-01 ENCOUNTER — Encounter: Payer: Medicare Other | Admitting: Occupational Therapy

## 2019-10-03 ENCOUNTER — Encounter: Payer: Medicare Other | Admitting: Occupational Therapy

## 2019-10-04 ENCOUNTER — Ambulatory Visit: Payer: Medicare Other | Admitting: Sports Medicine

## 2019-10-04 ENCOUNTER — Other Ambulatory Visit (HOSPITAL_COMMUNITY): Payer: Medicare Other

## 2019-10-05 DIAGNOSIS — I81 Portal vein thrombosis: Secondary | ICD-10-CM | POA: Insufficient documentation

## 2019-10-08 ENCOUNTER — Encounter: Payer: Medicare Other | Admitting: Occupational Therapy

## 2019-10-08 ENCOUNTER — Ambulatory Visit (HOSPITAL_COMMUNITY): Admit: 2019-10-08 | Payer: Medicare Other | Admitting: Gastroenterology

## 2019-10-08 ENCOUNTER — Encounter (HOSPITAL_COMMUNITY): Payer: Self-pay

## 2019-10-08 SURGERY — COLONOSCOPY WITH PROPOFOL
Anesthesia: Monitor Anesthesia Care

## 2019-10-10 ENCOUNTER — Encounter: Payer: Medicare Other | Admitting: Occupational Therapy

## 2019-10-10 DIAGNOSIS — L0390 Cellulitis, unspecified: Secondary | ICD-10-CM | POA: Insufficient documentation

## 2019-10-10 DIAGNOSIS — A0472 Enterocolitis due to Clostridium difficile, not specified as recurrent: Secondary | ICD-10-CM | POA: Insufficient documentation

## 2019-10-12 ENCOUNTER — Telehealth: Payer: Self-pay

## 2019-10-12 NOTE — Telephone Encounter (Signed)
-----   Message from Wyatt Portela, MD sent at 10/12/2019  2:15 PM EDT ----- Regarding: RE: Michaela Corner noted.  Thanks ----- Message ----- From: Tami Lin, RN Sent: 10/12/2019   2:03 PM EDT To: Wyatt Portela, MD Subject: FYI                                            Patient called and wants to make you aware that she was admitted in the hospital for 25 days at [facility] for c diff and other issues. Patient states she is now on Coumadin and wanted to make you aware. I offered to reschedule appointment that patient missed 09/19/19. Patient stated she will call the office to reschedule in a couple of weeks.  Lanelle Bal

## 2019-10-12 NOTE — Telephone Encounter (Signed)
See note below

## 2019-10-15 ENCOUNTER — Encounter: Payer: Medicare Other | Admitting: Occupational Therapy

## 2019-10-17 ENCOUNTER — Encounter: Payer: Medicare Other | Admitting: Occupational Therapy

## 2019-10-22 ENCOUNTER — Encounter: Payer: Medicare Other | Admitting: Occupational Therapy

## 2019-10-24 ENCOUNTER — Encounter: Payer: Medicare Other | Admitting: Occupational Therapy

## 2019-10-26 ENCOUNTER — Ambulatory Visit: Payer: Medicare Other | Admitting: Internal Medicine

## 2019-10-29 ENCOUNTER — Encounter: Payer: Medicare Other | Admitting: Occupational Therapy

## 2019-10-31 ENCOUNTER — Encounter: Payer: Medicare Other | Admitting: Occupational Therapy

## 2019-11-05 ENCOUNTER — Encounter: Payer: Medicare Other | Admitting: Occupational Therapy

## 2019-11-05 DIAGNOSIS — Z8679 Personal history of other diseases of the circulatory system: Secondary | ICD-10-CM | POA: Insufficient documentation

## 2019-11-07 ENCOUNTER — Encounter: Payer: Medicare Other | Admitting: Occupational Therapy

## 2019-12-10 ENCOUNTER — Ambulatory Visit: Payer: Medicare Other | Admitting: Podiatrist

## 2019-12-12 ENCOUNTER — Inpatient Hospital Stay (HOSPITAL_COMMUNITY): Payer: Medicare Other

## 2019-12-12 ENCOUNTER — Encounter (HOSPITAL_COMMUNITY): Payer: Self-pay

## 2019-12-12 ENCOUNTER — Emergency Department (HOSPITAL_COMMUNITY): Payer: Medicare Other

## 2019-12-12 ENCOUNTER — Other Ambulatory Visit: Payer: Self-pay

## 2019-12-12 ENCOUNTER — Inpatient Hospital Stay (HOSPITAL_COMMUNITY)
Admission: EM | Admit: 2019-12-12 | Discharge: 2019-12-18 | DRG: 064 | Disposition: A | Discharge status: Rehabilitation Facility | Payer: Medicare Other | Attending: Internal Medicine | Admitting: Internal Medicine

## 2019-12-12 DIAGNOSIS — M795 Residual foreign body in soft tissue: Secondary | ICD-10-CM | POA: Insufficient documentation

## 2019-12-12 DIAGNOSIS — E8770 Fluid overload, unspecified: Secondary | ICD-10-CM | POA: Diagnosis present

## 2019-12-12 DIAGNOSIS — K649 Unspecified hemorrhoids: Secondary | ICD-10-CM | POA: Diagnosis not present

## 2019-12-12 DIAGNOSIS — D6832 Hemorrhagic disorder due to extrinsic circulating anticoagulants: Secondary | ICD-10-CM | POA: Diagnosis present

## 2019-12-12 DIAGNOSIS — F419 Anxiety disorder, unspecified: Secondary | ICD-10-CM | POA: Diagnosis present

## 2019-12-12 DIAGNOSIS — M351 Other overlap syndromes: Secondary | ICD-10-CM | POA: Diagnosis present

## 2019-12-12 DIAGNOSIS — M7918 Myalgia, other site: Secondary | ICD-10-CM | POA: Diagnosis not present

## 2019-12-12 DIAGNOSIS — I629 Nontraumatic intracranial hemorrhage, unspecified: Secondary | ICD-10-CM

## 2019-12-12 DIAGNOSIS — E039 Hypothyroidism, unspecified: Secondary | ICD-10-CM | POA: Diagnosis present

## 2019-12-12 DIAGNOSIS — K5901 Slow transit constipation: Secondary | ICD-10-CM | POA: Diagnosis not present

## 2019-12-12 DIAGNOSIS — D61818 Other pancytopenia: Secondary | ICD-10-CM | POA: Diagnosis not present

## 2019-12-12 DIAGNOSIS — Z992 Dependence on renal dialysis: Secondary | ICD-10-CM | POA: Diagnosis not present

## 2019-12-12 DIAGNOSIS — I120 Hypertensive chronic kidney disease with stage 5 chronic kidney disease or end stage renal disease: Secondary | ICD-10-CM | POA: Diagnosis present

## 2019-12-12 DIAGNOSIS — Z20822 Contact with and (suspected) exposure to covid-19: Secondary | ICD-10-CM | POA: Diagnosis present

## 2019-12-12 DIAGNOSIS — G8191 Hemiplegia, unspecified affecting right dominant side: Secondary | ICD-10-CM | POA: Diagnosis present

## 2019-12-12 DIAGNOSIS — E162 Hypoglycemia, unspecified: Secondary | ICD-10-CM | POA: Diagnosis not present

## 2019-12-12 DIAGNOSIS — K644 Residual hemorrhoidal skin tags: Secondary | ICD-10-CM | POA: Diagnosis present

## 2019-12-12 DIAGNOSIS — Z23 Encounter for immunization: Secondary | ICD-10-CM | POA: Diagnosis not present

## 2019-12-12 DIAGNOSIS — G479 Sleep disorder, unspecified: Secondary | ICD-10-CM | POA: Diagnosis not present

## 2019-12-12 DIAGNOSIS — I619 Nontraumatic intracerebral hemorrhage, unspecified: Secondary | ICD-10-CM | POA: Diagnosis present

## 2019-12-12 DIAGNOSIS — T45515A Adverse effect of anticoagulants, initial encounter: Secondary | ICD-10-CM | POA: Diagnosis present

## 2019-12-12 DIAGNOSIS — E785 Hyperlipidemia, unspecified: Secondary | ICD-10-CM | POA: Diagnosis present

## 2019-12-12 DIAGNOSIS — D6959 Other secondary thrombocytopenia: Secondary | ICD-10-CM | POA: Diagnosis present

## 2019-12-12 DIAGNOSIS — I7300 Raynaud's syndrome without gangrene: Secondary | ICD-10-CM | POA: Diagnosis present

## 2019-12-12 DIAGNOSIS — D631 Anemia in chronic kidney disease: Secondary | ICD-10-CM | POA: Diagnosis present

## 2019-12-12 DIAGNOSIS — K7460 Unspecified cirrhosis of liver: Secondary | ICD-10-CM | POA: Diagnosis not present

## 2019-12-12 DIAGNOSIS — G609 Hereditary and idiopathic neuropathy, unspecified: Secondary | ICD-10-CM | POA: Diagnosis present

## 2019-12-12 DIAGNOSIS — I81 Portal vein thrombosis: Secondary | ICD-10-CM | POA: Diagnosis present

## 2019-12-12 DIAGNOSIS — I6389 Other cerebral infarction: Secondary | ICD-10-CM | POA: Diagnosis not present

## 2019-12-12 DIAGNOSIS — E871 Hypo-osmolality and hyponatremia: Secondary | ICD-10-CM | POA: Diagnosis not present

## 2019-12-12 DIAGNOSIS — Z8249 Family history of ischemic heart disease and other diseases of the circulatory system: Secondary | ICD-10-CM

## 2019-12-12 DIAGNOSIS — K5900 Constipation, unspecified: Secondary | ICD-10-CM | POA: Diagnosis present

## 2019-12-12 DIAGNOSIS — R531 Weakness: Secondary | ICD-10-CM | POA: Diagnosis present

## 2019-12-12 DIAGNOSIS — M109 Gout, unspecified: Secondary | ICD-10-CM | POA: Diagnosis present

## 2019-12-12 DIAGNOSIS — N186 End stage renal disease: Secondary | ICD-10-CM | POA: Diagnosis present

## 2019-12-12 DIAGNOSIS — I610 Nontraumatic intracerebral hemorrhage in hemisphere, subcortical: Secondary | ICD-10-CM | POA: Diagnosis not present

## 2019-12-12 DIAGNOSIS — E876 Hypokalemia: Secondary | ICD-10-CM | POA: Diagnosis present

## 2019-12-12 DIAGNOSIS — Z8672 Personal history of thrombophlebitis: Secondary | ICD-10-CM

## 2019-12-12 DIAGNOSIS — G936 Cerebral edema: Secondary | ICD-10-CM | POA: Diagnosis present

## 2019-12-12 DIAGNOSIS — E8889 Other specified metabolic disorders: Secondary | ICD-10-CM | POA: Diagnosis present

## 2019-12-12 DIAGNOSIS — I10 Essential (primary) hypertension: Secondary | ICD-10-CM

## 2019-12-12 DIAGNOSIS — K220 Achalasia of cardia: Secondary | ICD-10-CM | POA: Diagnosis present

## 2019-12-12 DIAGNOSIS — M349 Systemic sclerosis, unspecified: Secondary | ICD-10-CM | POA: Diagnosis present

## 2019-12-12 DIAGNOSIS — I611 Nontraumatic intracerebral hemorrhage in hemisphere, cortical: Secondary | ICD-10-CM | POA: Diagnosis present

## 2019-12-12 DIAGNOSIS — I480 Paroxysmal atrial fibrillation: Secondary | ICD-10-CM | POA: Diagnosis present

## 2019-12-12 DIAGNOSIS — I5022 Chronic systolic (congestive) heart failure: Secondary | ICD-10-CM | POA: Diagnosis not present

## 2019-12-12 DIAGNOSIS — Z7901 Long term (current) use of anticoagulants: Secondary | ICD-10-CM | POA: Diagnosis not present

## 2019-12-12 DIAGNOSIS — M797 Fibromyalgia: Secondary | ICD-10-CM | POA: Diagnosis present

## 2019-12-12 DIAGNOSIS — I69151 Hemiplegia and hemiparesis following nontraumatic intracerebral hemorrhage affecting right dominant side: Secondary | ICD-10-CM | POA: Diagnosis not present

## 2019-12-12 DIAGNOSIS — D62 Acute posthemorrhagic anemia: Secondary | ICD-10-CM | POA: Diagnosis not present

## 2019-12-12 DIAGNOSIS — Z9981 Dependence on supplemental oxygen: Secondary | ICD-10-CM

## 2019-12-12 DIAGNOSIS — I272 Pulmonary hypertension, unspecified: Secondary | ICD-10-CM | POA: Diagnosis present

## 2019-12-12 DIAGNOSIS — I132 Hypertensive heart and chronic kidney disease with heart failure and with stage 5 chronic kidney disease, or end stage renal disease: Secondary | ICD-10-CM | POA: Diagnosis not present

## 2019-12-12 DIAGNOSIS — I361 Nonrheumatic tricuspid (valve) insufficiency: Secondary | ICD-10-CM | POA: Diagnosis not present

## 2019-12-12 DIAGNOSIS — K589 Irritable bowel syndrome without diarrhea: Secondary | ICD-10-CM | POA: Diagnosis present

## 2019-12-12 DIAGNOSIS — I482 Chronic atrial fibrillation, unspecified: Secondary | ICD-10-CM | POA: Diagnosis not present

## 2019-12-12 DIAGNOSIS — Z9109 Other allergy status, other than to drugs and biological substances: Secondary | ICD-10-CM

## 2019-12-12 DIAGNOSIS — K7469 Other cirrhosis of liver: Secondary | ICD-10-CM | POA: Diagnosis present

## 2019-12-12 DIAGNOSIS — M069 Rheumatoid arthritis, unspecified: Secondary | ICD-10-CM | POA: Diagnosis present

## 2019-12-12 DIAGNOSIS — M623 Immobility syndrome (paraplegic): Secondary | ICD-10-CM | POA: Diagnosis not present

## 2019-12-12 DIAGNOSIS — N2581 Secondary hyperparathyroidism of renal origin: Secondary | ICD-10-CM | POA: Diagnosis present

## 2019-12-12 DIAGNOSIS — R040 Epistaxis: Secondary | ICD-10-CM | POA: Diagnosis not present

## 2019-12-12 DIAGNOSIS — J9611 Chronic respiratory failure with hypoxia: Secondary | ICD-10-CM | POA: Diagnosis not present

## 2019-12-12 DIAGNOSIS — K219 Gastro-esophageal reflux disease without esophagitis: Secondary | ICD-10-CM | POA: Diagnosis present

## 2019-12-12 DIAGNOSIS — J069 Acute upper respiratory infection, unspecified: Secondary | ICD-10-CM

## 2019-12-12 DIAGNOSIS — Z87410 Personal history of cervical dysplasia: Secondary | ICD-10-CM

## 2019-12-12 DIAGNOSIS — Z79899 Other long term (current) drug therapy: Secondary | ICD-10-CM

## 2019-12-12 DIAGNOSIS — Z888 Allergy status to other drugs, medicaments and biological substances status: Secondary | ICD-10-CM

## 2019-12-12 DIAGNOSIS — D696 Thrombocytopenia, unspecified: Secondary | ICD-10-CM | POA: Diagnosis not present

## 2019-12-12 LAB — CBG MONITORING, ED
Glucose-Capillary: 22 mg/dL — CL (ref 70–99)
Glucose-Capillary: 25 mg/dL — CL (ref 70–99)
Glucose-Capillary: 75 mg/dL (ref 70–99)

## 2019-12-12 LAB — I-STAT CHEM 8, ED
BUN: 47 mg/dL — ABNORMAL HIGH (ref 8–23)
Calcium, Ion: 1.02 mmol/L — ABNORMAL LOW (ref 1.15–1.40)
Chloride: 102 mmol/L (ref 98–111)
Creatinine, Ser: 6.3 mg/dL — ABNORMAL HIGH (ref 0.44–1.00)
Glucose, Bld: 85 mg/dL (ref 70–99)
HCT: 36 % (ref 36.0–46.0)
Hemoglobin: 12.2 g/dL (ref 12.0–15.0)
Potassium: 4.4 mmol/L (ref 3.5–5.1)
Sodium: 141 mmol/L (ref 135–145)
TCO2: 29 mmol/L (ref 22–32)

## 2019-12-12 LAB — COMPREHENSIVE METABOLIC PANEL
ALT: 25 U/L (ref 0–44)
AST: 35 U/L (ref 15–41)
Albumin: 2.9 g/dL — ABNORMAL LOW (ref 3.5–5.0)
Alkaline Phosphatase: 152 U/L — ABNORMAL HIGH (ref 38–126)
Anion gap: 14 (ref 5–15)
BUN: 42 mg/dL — ABNORMAL HIGH (ref 8–23)
CO2: 27 mmol/L (ref 22–32)
Calcium: 8.8 mg/dL — ABNORMAL LOW (ref 8.9–10.3)
Chloride: 99 mmol/L (ref 98–111)
Creatinine, Ser: 6.18 mg/dL — ABNORMAL HIGH (ref 0.44–1.00)
GFR, Estimated: 7 mL/min — ABNORMAL LOW (ref >60)
Glucose, Bld: 91 mg/dL (ref 70–99)
Potassium: 4.7 mmol/L (ref 3.5–5.1)
Sodium: 140 mmol/L (ref 135–145)
Total Bilirubin: 0.6 mg/dL (ref 0.3–1.2)
Total Protein: 7.2 g/dL (ref 6.5–8.1)

## 2019-12-12 LAB — CBC
HCT: 34.6 % — ABNORMAL LOW (ref 36.0–46.0)
Hemoglobin: 11.1 g/dL — ABNORMAL LOW (ref 12.0–15.0)
MCH: 30.4 pg (ref 26.0–34.0)
MCHC: 32.1 g/dL (ref 30.0–36.0)
MCV: 94.8 fL (ref 80.0–100.0)
Platelets: 131 10*3/uL — ABNORMAL LOW (ref 150–400)
RBC: 3.65 MIL/uL — ABNORMAL LOW (ref 3.87–5.11)
RDW: 17.9 % — ABNORMAL HIGH (ref 11.5–15.5)
WBC: 3.3 10*3/uL — ABNORMAL LOW (ref 4.0–10.5)
nRBC: 0 % (ref 0.0–0.2)

## 2019-12-12 LAB — PROTIME-INR
INR: 1.7 — ABNORMAL HIGH (ref 0.8–1.2)
Prothrombin Time: 19 seconds — ABNORMAL HIGH (ref 11.4–15.2)

## 2019-12-12 LAB — DIFFERENTIAL
Abs Immature Granulocytes: 0.01 10*3/uL (ref 0.00–0.07)
Basophils Absolute: 0.1 10*3/uL (ref 0.0–0.1)
Basophils Relative: 2 %
Eosinophils Absolute: 0.1 10*3/uL (ref 0.0–0.5)
Eosinophils Relative: 2 %
Immature Granulocytes: 0 %
Lymphocytes Relative: 23 %
Lymphs Abs: 0.8 10*3/uL (ref 0.7–4.0)
Monocytes Absolute: 0.6 10*3/uL (ref 0.1–1.0)
Monocytes Relative: 18 %
Neutro Abs: 1.8 10*3/uL (ref 1.7–7.7)
Neutrophils Relative %: 55 %

## 2019-12-12 LAB — ETHANOL: Alcohol, Ethyl (B): <10 mg/dL (ref <10)

## 2019-12-12 LAB — LIPID PANEL
Cholesterol: 194 mg/dL (ref 0–200)
HDL: 105 mg/dL (ref >40)
LDL Cholesterol: 79 mg/dL (ref 0–99)
Total CHOL/HDL Ratio: 1.8 RATIO
Triglycerides: 48 mg/dL (ref <150)
VLDL: 10 mg/dL (ref 0–40)

## 2019-12-12 LAB — HIV ANTIBODY (ROUTINE TESTING W REFLEX): HIV Screen 4th Generation wRfx: NONREACTIVE

## 2019-12-12 LAB — HEMOGLOBIN A1C
Hgb A1c MFr Bld: 5.2 % (ref 4.8–5.6)
Mean Plasma Glucose: 102.54 mg/dL

## 2019-12-12 LAB — APTT: aPTT: 38 seconds — ABNORMAL HIGH (ref 24–36)

## 2019-12-12 MED ORDER — CLEVIDIPINE BUTYRATE 0.5 MG/ML IV EMUL: 0.0000 mg/h | INTRAVENOUS | Status: DC

## 2019-12-12 MED ORDER — ACETAMINOPHEN 650 MG RE SUPP: 650.0000 mg | RECTAL | Status: DC | PRN

## 2019-12-12 MED ORDER — DEXTROSE 50 % IV SOLN: 1.0000 | Freq: Once | INTRAVENOUS | Status: AC

## 2019-12-12 MED ORDER — STROKE: EARLY STAGES OF RECOVERY BOOK
Freq: Once | Status: AC
  Filled 2019-12-12 (×2): qty 1

## 2019-12-12 MED ORDER — PANTOPRAZOLE SODIUM 40 MG IV SOLR
40.0000 mg | Freq: Every day | INTRAVENOUS | Status: DC
  Administered 2019-12-12 – 2019-12-13 (×2): 40 mg via INTRAVENOUS
  Filled 2019-12-12 (×2): qty 40

## 2019-12-12 MED ORDER — DEXTROSE 50 % IV SOLN
INTRAVENOUS | Status: AC
  Administered 2019-12-12: 50 mL via INTRAVENOUS
  Filled 2019-12-12: qty 50

## 2019-12-12 MED ORDER — PROTHROMBIN COMPLEX CONC HUMAN 500 UNITS IV KIT
1568.0000 [IU] | PACK | Status: AC
  Administered 2019-12-12: 1568 [IU] via INTRAVENOUS
  Filled 2019-12-12: qty 568

## 2019-12-12 MED ORDER — LABETALOL HCL 5 MG/ML IV SOLN
15.0000 mg | Freq: Once | INTRAVENOUS | Status: AC
  Administered 2019-12-12: 15 mg via INTRAVENOUS
  Filled 2019-12-12: qty 4

## 2019-12-12 MED ORDER — SENNOSIDES-DOCUSATE SODIUM 8.6-50 MG PO TABS
1.0000 | ORAL_TABLET | Freq: Two times a day (BID) | ORAL | Status: DC
  Administered 2019-12-13 – 2019-12-18 (×8): 1 via ORAL
  Filled 2019-12-12 (×9): qty 1

## 2019-12-12 MED ORDER — ACETAMINOPHEN 325 MG PO TABS
650.0000 mg | ORAL_TABLET | ORAL | Status: DC | PRN
  Administered 2019-12-13 – 2019-12-18 (×6): 650 mg via ORAL
  Filled 2019-12-12 (×7): qty 2

## 2019-12-12 MED ORDER — CLEVIDIPINE BUTYRATE 0.5 MG/ML IV EMUL
0.0000 mg/h | INTRAVENOUS | Status: DC
  Filled 2019-12-12: qty 50

## 2019-12-12 MED ORDER — CHLORHEXIDINE GLUCONATE CLOTH 2 % EX PADS
6.0000 | MEDICATED_PAD | Freq: Every day | CUTANEOUS | Status: DC
  Administered 2019-12-14 – 2019-12-18 (×4): 6 via TOPICAL

## 2019-12-12 MED ORDER — DOXERCALCIFEROL 4 MCG/2ML IV SOLN
6.0000 ug | INTRAVENOUS | Status: DC
  Filled 2019-12-12 (×3): qty 4

## 2019-12-12 MED ORDER — CHLORHEXIDINE GLUCONATE CLOTH 2 % EX PADS
6.0000 | MEDICATED_PAD | Freq: Every day | CUTANEOUS | Status: DC
  Administered 2019-12-12 – 2019-12-16 (×4): 6 via TOPICAL

## 2019-12-12 MED ORDER — LABETALOL HCL 5 MG/ML IV SOLN
10.0000 mg | INTRAVENOUS | Status: DC | PRN
  Administered 2019-12-13 – 2019-12-14 (×3): 10 mg via INTRAVENOUS
  Filled 2019-12-12 (×2): qty 4

## 2019-12-12 MED ORDER — CINACALCET HCL 30 MG PO TABS
30.0000 mg | ORAL_TABLET | ORAL | Status: DC
  Administered 2019-12-13 – 2019-12-18 (×3): 30 mg via ORAL
  Filled 2019-12-12 (×3): qty 1

## 2019-12-12 MED ORDER — FERRIC CITRATE 1 GM 210 MG(FE) PO TABS
420.0000 mg | ORAL_TABLET | Freq: Three times a day (TID) | ORAL | Status: DC
  Administered 2019-12-13 (×2): 420 mg via ORAL
  Filled 2019-12-12 (×5): qty 2

## 2019-12-12 MED ORDER — VITAMIN K1 10 MG/ML IJ SOLN
10.0000 mg | INTRAVENOUS | Status: AC
  Administered 2019-12-12: 10 mg via INTRAVENOUS
  Filled 2019-12-12: qty 1

## 2019-12-12 MED ORDER — ACETAMINOPHEN 160 MG/5ML PO SOLN: 650.0000 mg | ORAL | Status: DC | PRN

## 2019-12-12 MED ORDER — LABETALOL HCL 5 MG/ML IV SOLN: 20.0000 mg | Freq: Once | INTRAVENOUS | Status: DC

## 2019-12-12 MED ORDER — LORAZEPAM 2 MG/ML IJ SOLN
0.5000 mg | Freq: Once | INTRAMUSCULAR | Status: AC
  Administered 2019-12-12: 0.5 mg via INTRAVENOUS
  Filled 2019-12-12: qty 1

## 2019-12-12 NOTE — Progress Notes (Signed)
Received call from Dr Rory Percy at [phone] stating pt has had hemorrhagic stroke. SRN returned to bedside. Pt bp was elevated at 144/81 at 1723. Labetelol 15 mg given at 1727. Was planning to start Cleviprex gtt, but bp already in goal. See Flowsheets. K Centra and Vit K being prepared now and pharmacist will bring. NIHSS higher now as pt is drowsy, presumably from ativan.

## 2019-12-12 NOTE — Code Documentation (Signed)
PT BIB [facility] for right hemiplegia. Pt roomed into room 1. Code stroke called by EDP. Stroke team arrived at 1455 where neurologist was already examining pt. Pt was completely unable to move her right side. She was alert talkative, with no visual or neglect signs. Pt's LKW was confirmed to be 0500 this morning. As she is LVO negative and outside of the window for TPA, code stroke was cancelled. Notably CBG was 22 and 25 on recheck. D50 amp given by EDRN. Care plan discussed with EDRN.

## 2019-12-12 NOTE — ED Notes (Signed)
Pt transported to CT ?

## 2019-12-12 NOTE — Plan of Care (Signed)
At the request of family and patient, spoke with Dr. Daryll Drown, MD, a family cousin and an emergency medicine doctor. Explained to her my assessment. Discussed my plan with her. She was very appreciative of the care the patient is receiving. Her phone number is [phone].   -- Amie Portland, MD [facility] Pager: [phone] If 7pm to 7am, please call on call as listed on AMION.

## 2019-12-12 NOTE — Progress Notes (Signed)
Dr. Moshe Cipro paged and made aware of patient's events today and that pt gets HD t, th, s at [facility] on [address] and will need HD here.   Clance Boll, NP/Neuro

## 2019-12-12 NOTE — Progress Notes (Addendum)
Pt requiring more stimulation to awaken, and bilateral pupils are larger. MD notified, stat CTH repeated.

## 2019-12-12 NOTE — ED Triage Notes (Signed)
Per [facility]: Left arm restricted for dialysis. Went to bed at midnight, woke up at 1 pm today. Flaccid right arm and leg. No vision changes. No facial droop. No withdrawal from on right leg and arm but can feel the pain. Pt does not make urine. No other complaints, headache, or dizziness. Pt is on 2 L [place] during the day and 3 L [place] at night due to pulmonary hypertension. Pt states that her dialysis schedule is Tuesday, Thursday, Saturday, had her full treatment on Tuesday.

## 2019-12-12 NOTE — Progress Notes (Signed)
Vitamin K started

## 2019-12-12 NOTE — Consult Note (Signed)
Reason for Consult: To manage dialysis and dialysis related needs Referring Physician: [name] is an 64 y.o. female with past medical history significant for rheumatoid arthritis, scleroderma, pulmonary hypertension with complication of atrial fibrillation on Coumadin.  She also has ESRD, dialysis TTS at [place].  She completed her dialysis Tuesday without incident.  She presented to the emergency department early this morning with an acute inability to move her right arm and leg.  CT showed a left frontal lobe ICH.  INR was 1.7.  The plan is to manage medically, reverse her Coumadin.  She has minimal swelling so is not felt to need neurosurgery.  She is being admitted to the ICU for neuro monitoring.  We are asked to provide dialysis.  Patient is pretty alert and can give me her history, slightly somnolent.  Denies any other neurologic symptoms.   Dialyzes at [facility]-TTS 3 hours 30 minutes EDW 58.5.  Profile #2 [facility] [place] 2/2.25, Dialyzer 180, Heparin no. Access left upper arm AV fistula Mircera 50 every 4 weeks, Hectorol 6 q. treatment, Sensipar 30 q. Treatment Last potassium 4.5, last hemoglobin 9.8, last PTH 189, last phosphorus 4.5, last calcium 9.0.  Past Medical History:  Diagnosis Date  . Achalasia   . Anxiety   . Dysplasia of cervix, low grade (CIN 1)   . Environmental allergies    "all year long" (12/27/2016)  . ESRD (end stage renal disease) on dialysis [facility])    "TTS; [place]" (12/27/2016)  . Fibromyalgia   . GERD (gastroesophageal reflux disease)   . Gout   . Hypertension   . IBS (irritable bowel syndrome)   . MVP (mitral valve prolapse)   . RA (rheumatoid arthritis) (HCC)    FOLLOWED BY DR. SHANAHAN  . Raynaud's disease   . Scleroderma ([place])   . Seasonal allergies   . Thrombocytopenia ([place]) 07/01/2016   Acute fall to 13,000 07/01/16  . Tubular adenoma 01/08/2008   CECUM  . Vitamin D deficiency     Past Surgical History:  Procedure Laterality  Date  . ANKLE FRACTURE SURGERY Right   . AV FISTULA PLACEMENT Left 06/28/2016   Procedure: left arm ARTERIOVENOUS (AV) FISTULA CREATION;  Surgeon: Rosetta Posner, MD;  Location: [place];  Service: Vascular;  Laterality: Left;  . BASCILIC VEIN TRANSPOSITION Left 09/27/2016   Procedure: LEFT UPPER ARM CEPHALIC VEIN TRANSPOSITION;  Surgeon: Rosetta Posner, MD;  Location: [place];  Service: Vascular;  Laterality: Left;  . BREAST BIOPSY     "? side"  . CESAREAN SECTION  1994  . CO2 LASER OF CERVIX    . COLONOSCOPY W/ BIOPSIES  01/08/2008  . INSERTION OF DIALYSIS CATHETER Right 06/28/2016   Procedure: INSERTION OF DIALYSIS CATHETER, right internal jugular;  Surgeon: Rosetta Posner, MD;  Location: [place];  Service: Vascular;  Laterality: Right;  . MYOMECTOMY    . PELVIC LAPAROSCOPY  2011  . superficial thrombophlebitis Left 07-2014    Family History  Problem Relation Age of Onset  . Hypertension Mother   . Diabetes Mother   . Heart disease Father   . Hypertension Maternal Aunt   . Diabetes Maternal Grandmother   . Heart disease Paternal Grandfather   . Cerebral palsy Cousin        1ST COUSIN?  . Diabetes Paternal Grandmother     Social History:  reports that she has never smoked. She has never used smokeless tobacco. She reports that she does not  drink alcohol and does not use drugs.  Allergies:  Allergies  Allergen Reactions  . Savella [Milnacipran Hcl] Palpitations and Other (See Comments)    Unknown  . Tape Rash and Other (See Comments)    Itch- unsure if it was paper or adhesive tape    Medications: I have reviewed the patient's current medications.   Results for orders placed or performed during the hospital encounter of 12/12/19 (from the past 48 hour(s))  Ethanol     Status: None   Collection Time: 12/12/19  2:48 PM  Result Value Ref Range   Alcohol, Ethyl (B) <10 <10 mg/dL    Comment: (NOTE) Lowest detectable limit for serum alcohol is 10 mg/dL.  For medical purposes  only. Performed at [facility], [place] [address], [place], [place] [address]   CBG monitoring, ED     Status: Abnormal   Collection Time: 12/12/19  3:01 PM  Result Value Ref Range   Glucose-Capillary 22 (LL) 70 - 99 mg/dL    Comment: Glucose reference range applies only to samples taken after fasting for at least 8 hours.   Comment 1 Repeat Test   CBG monitoring, ED     Status: Abnormal   Collection Time: 12/12/19  3:03 PM  Result Value Ref Range   Glucose-Capillary 25 (LL) 70 - 99 mg/dL    Comment: Glucose reference range applies only to samples taken after fasting for at least 8 hours.  I-stat chem 8, ED     Status: Abnormal   Collection Time: 12/12/19  3:12 PM  Result Value Ref Range   Sodium 141 135 - 145 mmol/L   Potassium 4.4 3.5 - 5.1 mmol/L   Chloride 102 98 - 111 mmol/L   BUN 47 (H) 8 - 23 mg/dL   Creatinine, Ser 6.30 (H) 0.44 - 1.00 mg/dL   Glucose, Bld 85 70 - 99 mg/dL    Comment: Glucose reference range applies only to samples taken after fasting for at least 8 hours.   Calcium, Ion 1.02 (L) 1.15 - 1.40 mmol/L   TCO2 29 22 - 32 mmol/L   Hemoglobin 12.2 12.0 - 15.0 g/dL   HCT 36.0 36 - 46 %  CBG monitoring, ED     Status: None   Collection Time: 12/12/19  3:25 PM  Result Value Ref Range   Glucose-Capillary 75 70 - 99 mg/dL    Comment: Glucose reference range applies only to samples taken after fasting for at least 8 hours.   Comment 1 Notify RN    Comment 2 Document in Chart   Protime-INR     Status: Abnormal   Collection Time: 12/12/19  3:28 PM  Result Value Ref Range   Prothrombin Time 19.0 (H) 11.4 - 15.2 seconds   INR 1.7 (H) 0.8 - 1.2    Comment: (NOTE) INR goal varies based on device and disease states. Performed at [facility], [place] [address] [place], [place] [address]   APTT     Status: Abnormal   Collection Time: 12/12/19  3:28 PM  Result Value Ref Range   aPTT 38 (H) 24 - 36 seconds    Comment:        IF BASELINE aPTT IS  ELEVATED, SUGGEST PATIENT RISK ASSESSMENT BE USED TO DETERMINE APPROPRIATE ANTICOAGULANT THERAPY. Performed at [facility], [place] [address] [place], [place] [address]   CBC     Status: Abnormal   Collection Time: 12/12/19  3:28 PM  Result Value Ref Range   WBC 3.3 (L) 4.0 - 10.5 K/uL   RBC 3.65 (L) 3.87 - 5.11 MIL/uL   Hemoglobin 11.1 (L) 12.0 - 15.0 g/dL   HCT 34.6 (L) 36 - 46 %   MCV 94.8 80.0 - 100.0 fL   MCH 30.4 26.0 - 34.0 pg   MCHC 32.1 30.0 - 36.0 g/dL   RDW 17.9 (H) 11.5 - 15.5 %   Platelets 131 (L) 150 - 400 K/uL   nRBC 0.0 0.0 - 0.2 %    Comment: Performed at [facility] [address], [place], [place] [address]  Differential     Status: None   Collection Time: 12/12/19  3:28 PM  Result Value Ref Range   Neutrophils Relative % 55 %   Neutro Abs 1.8 1.7 - 7.7 K/uL   Lymphocytes Relative 23 %   Lymphs Abs 0.8 0.7 - 4.0 K/uL   Monocytes Relative 18 %   Monocytes Absolute 0.6 0.1 - 1.0 K/uL   Eosinophils Relative 2 %   Eosinophils Absolute 0.1 0.0 - 0.5 K/uL   Basophils Relative 2 %   Basophils Absolute 0.1 0.0 - 0.1 K/uL   Immature Granulocytes 0 %   Abs Immature Granulocytes 0.01 0.00 - 0.07 K/uL    Comment: Performed at [facility] [address], [place], [place] [address]  Comprehensive metabolic panel     Status: Abnormal   Collection Time: 12/12/19  3:28 PM  Result Value Ref Range   Sodium 140 135 - 145 mmol/L   Potassium 4.7 3.5 - 5.1 mmol/L    Comment: SLIGHT HEMOLYSIS   Chloride 99 98 - 111 mmol/L   CO2 27 22 - 32 mmol/L   Glucose, Bld 91 70 - 99 mg/dL    Comment: Glucose reference range applies only to samples taken after fasting for at least 8 hours.   BUN 42 (H) 8 - 23 mg/dL   Creatinine, Ser 6.18 (H) 0.44 - 1.00 mg/dL   Calcium 8.8 (L) 8.9 - 10.3 mg/dL   Total Protein 7.2 6.5 - 8.1 g/dL   Albumin 2.9 (L) 3.5 - 5.0 g/dL   AST 35 15 - 41 U/L   ALT 25 0 - 44 U/L   Alkaline Phosphatase 152 (H) 38 - 126 U/L   Total  Bilirubin 0.6 0.3 - 1.2 mg/dL   GFR, Estimated 7 (L) >60 mL/min    Comment: (NOTE) Calculated using the CKD-EPI Creatinine Equation (2021)    Anion gap 14 5 - 15    Comment: Performed at [facility], [place] [address], [place], [place] [address]  Hemoglobin A1c     Status: None   Collection Time: 12/12/19  8:42 PM  Result Value Ref Range   Hgb A1c MFr Bld 5.2 4.8 - 5.6 %    Comment: (NOTE) Pre diabetes:          5.7%-6.4%  Diabetes:              >6.4%  Glycemic control for   <7.0% adults with diabetes    Mean Plasma Glucose 102.54 mg/dL    Comment: Performed at [facility] [address], [place],  [address]    DG Skull 1-3 Views  Result Date: 12/12/2019 CLINICAL DATA:  Encounter for foreign body in soft tissue. Left arm restricted for dialysis. Went to bed at midnight, woke up at 1 pm today. Flaccid right arm and leg. No vision changes.  No facial droop. No withdrawal from on right leg and arm but can feel the pain. Pt does not make urine. No other complaints, headache, or dizziness. EXAM: SKULL - 1-3 VIEW COMPARISON:  None. FINDINGS: No skull fracture or lesion. No radiopaque foreign body.  Soft tissues are unremarkable. IMPRESSION: Negative. Electronically Signed   By: Lajean Manes M.D.   On: 12/12/2019 16:59   CT HEAD WO CONTRAST  Result Date: 12/12/2019 CLINICAL DATA:  Extremity weakness, stroke, intracranial hemorrhage EXAM: CT HEAD WITHOUT CONTRAST TECHNIQUE: Contiguous axial images were obtained from the base of the skull through the vertex without intravenous contrast. COMPARISON:  12/12/2019 at 5:05 p.m. FINDINGS: Brain: The left frontal intraparenchymal hemorrhage seen previously is again identified, measuring 3.1 x 1.7 by 2.9 cm, not significantly changed since prior study. Smaller adjacent 0.5 cm focus of hemorrhage is also unchanged. Mild surrounding edema with regional mass effect is stable. No midline shift. Lateral ventricles and midline structures are  unremarkable. Vascular: Stable atherosclerosis.  No hyperdense vessel. Skull: Normal. Negative for fracture or focal lesion. Sinuses/Orbits: Stable right maxillary sinus mucosal thickening. Other: Stable calcifications within the bilateral parotid glands. IMPRESSION: 1. Stable left frontal intraparenchymal hemorrhage and mild surrounding edema. Electronically Signed   By: Randa Ngo M.D.   On: 12/12/2019 18:51   CT HEAD WO CONTRAST  Result Date: 12/12/2019 CLINICAL DATA:  Right-sided weakness EXAM: CT HEAD WITHOUT CONTRAST TECHNIQUE: Contiguous axial images were obtained from the base of the skull through the vertex without intravenous contrast. COMPARISON:  None. FINDINGS: Brain: There is acute parenchymal hemorrhage within the posterior left frontal lobe involving the superior frontal and precentral gyri. This measures approximately 2.9 x 1.7 x 2.9 cm. Adjacent subcentimeter focus of hemorrhage. Mild surrounding edema with minor regional mass effect. Gray-white differentiation is preserved. Ventricles and sulci are within normal limits in size and configuration. Vascular: There is atherosclerotic calcification at the skull base. Skull: Calvarium is unremarkable. Sinuses/Orbits: No acute finding. Other: Mastoid air cells are clear. Multiple punctate bilateral parotid calculi. IMPRESSION: Acute left frontal parenchymal hemorrhage with mild edema. No significant mass effect. Critical Value/emergent results were called by telephone at the time of interpretation on 12/12/2019 at 5:17 pm to provider [facility] , who verbally acknowledged these results. Electronically Signed   By: Macy Mis M.D.   On: 12/12/2019 17:22    ROS: Inability to move right arm and right leg-has had some chronic left lower extremity issues and they still exist Blood pressure (!) 149/85, pulse 78, temperature 98.8 F (37.1 C), temperature source Oral, resp. rate 19, height 5\' 5"  (1.651 m), weight 57.4 kg, last menstrual period  11/15/2008, SpO2 95 %. General appearance: fatigued Resp: clear to auscultation bilaterally Cardio: irregularly irregular rhythm GI: soft, non-tender; bowel sounds normal; no masses,  no organomegaly Extremities: edema Trace edema to right lower extremity, trace edema to left lower extremity, some indurated tissue especially posteriorly with a slit wound opening.  Does not appear to be overtly infected Neurologic: Motor: Inability to move right arm and leg Left upper arm AV fistula is patent  Assessment/Plan: 64 year old black female with multiple medical issues including ESRD.  Presents with right hemiplegia secondary to [place] 1 ICH-thought secondary to fairly new Coumadin therapy.  Coumadin has been held, reversing agents on board, neurology following, to be admitted to ICU for neuro monitoring 2 ESRD: We will continue with TTS schedule via aVF-will be due next tomorrow.  Minimal ultrafiltration to avoid blood pressure drop 3 Hypertension:  Blood pressure for the most part seems okay at present.  If anything has issues with hypotension.  Currently on tadalafil and Uptravi for her pulmonary hypertension 4. Anemia of ESRD: Does not appear to be an issue at this time 5. Metabolic Bone Disease: We will continue her on her vitamin D, Sensipar and Auryxia/Renvela   Katie Nunez 12/12/2019, 9:13 PM

## 2019-12-12 NOTE — ED Notes (Signed)
Help get patient undress on the monitor did ekg shown to Dr Stark Jock patient is resting with call bell in reach

## 2019-12-12 NOTE — Progress Notes (Signed)
K Centra started.

## 2019-12-12 NOTE — ED Notes (Signed)
Provider  at bedside, aware of low blood sugar.

## 2019-12-12 NOTE — Progress Notes (Signed)
Handoff with ED RN. Pt will need q 1 hr VS and mNIHSS as well as pupil checks. [place] bed.

## 2019-12-12 NOTE — ED Notes (Signed)
Patient transported to MRI 

## 2019-12-12 NOTE — ED Provider Notes (Addendum)
[facility] EMERGENCY DEPARTMENT Provider Note   CSN: [id number] Arrival date & time: 12/12/19  1424     History Chief Complaint  Patient presents with   Extremity Weakness    Right leg and arm    Katie Nunez is a 64 y.o. female.  HPI 64 year old female with a history of ESRD on dialysis TTHS, GERD, hypertension, gout, fibromyalgia, SVT, prior ICH on warfarin presented to the ER with inability to move her right arm and right leg.  Patient states that she went to bed late, exactly unsure of the exact time but approximately 10 around 12 AM, got up at around 2 instead after 5 AM and was normal.  Went to bed at 5 AM and woke up with the inability to move her right arm and right leg.  She denies any falls, no headache.  States that she can feel and has pain perception, but cannot move it.  No prior history of strokes.  She is on warfarin for "clots in her liver".  She states that she did not take her dose yesterday because she was not sure if she had taken her medicine.  Denies any nausea or vomiting.  No slurred speech, no visual changes, no facial droop.    Past Medical History:  Diagnosis Date   Achalasia    Anxiety    Dysplasia of cervix, low grade (CIN 1)    Environmental allergies    "all year long" (12/27/2016)   ESRD (end stage renal disease) on dialysis ([place])    "TTS; [place]" (12/27/2016)   Fibromyalgia    GERD (gastroesophageal reflux disease)    Gout    Hypertension    IBS (irritable bowel syndrome)    MVP (mitral valve prolapse)    RA (rheumatoid arthritis) ([place])    FOLLOWED BY DR. SHANAHAN   Raynaud's disease    Scleroderma ([place])    Seasonal allergies    Thrombocytopenia ([place]) 07/01/2016   Acute fall to 13,000 07/01/16   Tubular adenoma 01/08/2008   CECUM   Vitamin D deficiency     Patient Active Problem List   Diagnosis Date Noted   ICH (intracerebral hemorrhage) ([place]) 12/12/2019   Viral disease 08/22/2019    Chronic right-sided heart failure ([place]) 07/09/2019   Supraventricular tachycardia ([place]) 07/09/2019   Other cirrhosis of liver ([place]) 06/27/2019   Heart failure ([place]) 02/21/2019   Heart palpitations 08/14/2018   Other fluid overload 07/27/2018   Encounter for long-term (current) use of other medications 01/18/2018   Hypothyroidism 01/18/2018   Myalgia and myositis 01/18/2018   Chronic nephritis 01/18/2018   Other interstitial pulmonary diseases with fibrosis in diseases classified elsewhere ([place]) 01/18/2018   Other long term (current) drug therapy 01/18/2018   Sleep apnea 01/18/2018   Unspecified persistent mental disorders due to conditions classified elsewhere 01/18/2018   Venous reflux 01/18/2018   Vitamin B12 deficiency 01/18/2018   Vitamin D deficiency 01/18/2018   Obstructive lung disease ([place]) 12/30/2017   Other pruritus 12/27/2017   Eruption cyst 12/19/2017   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 ([place]) 11/28/2017   Chronic cough 11/11/2017   Pulmonary hypertension ([place]) 11/11/2017   Pulmonary arterial hypertension ([place]) 10/07/2017   Acute ITP ([place]) 01/05/2017   ESRD (end stage renal disease) ([place]) 12/28/2016   Encounter for removal of sutures 09/09/2016   Coagulation defect, unspecified ([place]) 09/01/2016   Underimmunization status 08/05/2016   Hemolytic anemia ([place]) 07/26/2016   Epistaxis, recurrent  07/26/2016   Unspecified protein-calorie malnutrition ([place]) 07/13/2016   Aftercare including intermittent dialysis ([place]) 07/07/2016   Anemia in chronic kidney disease 07/07/2016   Hypokalemia 07/07/2016   Iron deficiency anemia, unspecified 07/07/2016   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   Nonrheumatic mitral (valve) prolapse 07/07/2016   Other irritable bowel syndrome 07/07/2016   Other secondary thrombocytopenia 07/07/2016   Secondary hyperparathyroidism of renal origin ([place]) 07/07/2016   Thrombocytopenia  ([place]) 07/01/2016   ARF (acute renal failure) ([place]) 06/25/2016   Anxiety 06/25/2016   Bilateral carpal tunnel syndrome 10/22/2015   Chronic gout without tophus 10/22/2015   Chronic nonintractable headache 10/08/2015   Fibroid uterus 01/03/2012   H/O vitamin D deficiency 01/03/2012   Post-menopausal 01/03/2012   Hereditary and idiopathic peripheral neuropathy 11/19/2011   Intestinal malabsorption 94/76/5465   Lichen planus 03/54/6568   Low back pain 11/19/2011   Diffuse spasm of esophagus 11/11/2011   ESR raised 11/11/2011   Postinflammatory pulmonary fibrosis ([place]) 11/11/2011   Scleroderma ([place]) 11/16/2010   Rheumatoid arthritis ([place]) 11/16/2010   Raynaud's disease 11/16/2010   Symptomatic menopausal or female climacteric states 11/16/2010    Past Surgical History:  Procedure Laterality Date   ANKLE FRACTURE SURGERY Right    AV FISTULA PLACEMENT Left 06/28/2016   Procedure: left arm ARTERIOVENOUS (AV) FISTULA CREATION;  Surgeon: Rosetta Posner, MD;  Location: [facility] OR;  Service: Vascular;  Laterality: Left;   [place] Left 09/27/2016   Procedure: LEFT UPPER ARM CEPHALIC VEIN TRANSPOSITION;  Surgeon: Rosetta Posner, MD;  Location: [facility] OR;  Service: Vascular;  Laterality: Left;   BREAST BIOPSY     "? side"   [place] W/ BIOPSIES  01/08/2008   INSERTION OF DIALYSIS CATHETER Right 06/28/2016   Procedure: INSERTION OF DIALYSIS CATHETER, right internal jugular;  Surgeon: Rosetta Posner, MD;  Location: [facility] OR;  Service: Vascular;  Laterality: Right;   MYOMECTOMY     PELVIC LAPAROSCOPY  2011   superficial thrombophlebitis Left 07-2014     OB History    Gravida  1   Para  1   Term  1   Preterm      AB      Living  1     SAB      TAB      Ectopic      Multiple      Live Births  1           Family History  Problem Relation Age of Onset   Hypertension Mother     Diabetes Mother    Heart disease Father    Hypertension Maternal Aunt    Diabetes Maternal Grandmother    Heart disease Paternal Grandfather    Cerebral palsy Cousin        1ST COUSIN?   Diabetes Paternal Grandmother     Social History   Tobacco Use   Smoking status: Never Smoker   Smokeless tobacco: Never Used  Vaping Use   Vaping Use: Never used  Substance Use Topics   Alcohol use: No   Drug use: No    Home Medications Prior to Admission medications   Medication Sig Start Date End Date Taking? Authorizing Provider  acetaminophen (TYLENOL) 500 MG tablet Take 500-1,000 mg by mouth every 6 (six) hours as needed (for pain/headaches.).     [provider]  ambrisentan (LETAIRIS) 5 MG tablet Take by mouth.  [provider]  calcium acetate (PHOSLO) 667 MG capsule Take 667 mg by mouth 5 (five) times daily. 06/22/19   [provider]  cephALEXin (KEFLEX) 500 MG capsule Take 1 capsule (500 mg total) by mouth 2 (two) times daily. On dialysis days, take dose after dialysis. 08/17/19   Lajean Saver, MD  cinacalcet (SENSIPAR) 30 MG tablet Take 30 mg by mouth at bedtime.     [provider]  clonazePAM (KLONOPIN) 0.5 MG tablet Take 0.5 mg by mouth 2 (two) times daily as needed for anxiety.     [provider]  clotrimazole-betamethasone (LOTRISONE) cream Apply 1 application topically 2 (two) times daily. Apply to itchy area under toes twice a day 05/07/19   Bronson Ing, DPM  cyclobenzaprine (FLEXERIL) 5 MG tablet Take 1 tablet (5 mg total) by mouth 3 (three) times daily as needed for muscle spasms. Patient taking differently: Take 10 mg by mouth 3 (three) times daily as needed for muscle spasms.  07/07/16   Theodis Blaze, MD  docusate sodium (COLACE) 100 MG capsule Take 2 capsules (200 mg total) by mouth 2 (two) times daily. Patient taking differently: Take 200 mg by mouth 2 (two) times daily as needed (for constipation).  07/07/16    Theodis Blaze, MD  ferric citrate (AURYXIA) 1 GM 210 MG(Fe) tablet Take 420 mg by mouth 3 (three) times daily with meals.     [provider]  fluticasone (FLONASE) 50 MCG/ACT nasal spray Place 1 spray into the nose daily. 12/15/09   [provider]  gabapentin (NEURONTIN) 100 MG capsule Take 100 mg by mouth daily as needed (pain).     [provider]  gentamicin ointment (GARAMYCIN) 0.1 % Apply to toe after soaking twice daily 06/25/19   Bronson Ing, DPM  hydrOXYzine (ATARAX/VISTARIL) 25 MG tablet Take 25 mg by mouth every 8 (eight) hours as needed. 06/22/19   [provider]  ipratropium (ATROVENT) 0.06 % nasal spray Place 2 sprays into both nostrils 4 (four) times daily. Patient not taking: Reported on 08/27/2019    [provider]  lactulose (CHRONULAC) 10 GM/15ML solution SMARTSIG:15 Milliliter(s) By Mouth 3-4 Times Daily 07/25/19   [provider]  levocetirizine (XYZAL) 5 MG tablet Take 5 mg by mouth every evening.    [provider]  loperamide (IMODIUM) 2 MG capsule Take 2-4 mg by mouth 3 (three) times daily as needed for diarrhea or loose stools.    [provider]  metoprolol succinate (TOPROL-XL) 25 MG 24 hr tablet Take 12.5 mg by mouth daily. 11/14/18   [provider]  metoprolol tartrate (LOPRESSOR) 25 MG tablet Take by mouth. Patient not taking: Reported on 08/27/2019    [provider]  montelukast (SINGULAIR) 10 MG tablet Take 10 mg by mouth daily as needed (allergy symptoms).  Patient not taking: Reported on 08/27/2019 08/27/16   [provider]  multivitamin (RENA-VIT) TABS tablet Take 1 tablet by mouth daily.  Patient not taking: Reported on 08/27/2019 03/03/17   [provider]  pantoprazole (PROTONIX) 40 MG tablet TAKE 1 TABLET(40 MG) BY MOUTH DAILY Patient not taking: Reported on 08/27/2019 11/11/17   Collene Gobble, MD  phenylephrine (NEO-SYNEPHRINE) 0.5 % nasal  solution Place 1 drop into both nostrils every 6 (six) hours as needed for congestion. 07/07/16   Theodis Blaze, MD  Selexipag (UPTRAVI) 200 MCG TABS 600 mg 2 (two) times daily 09/19/18   [provider]  sevelamer  carbonate (RENVELA) 800 MG tablet Take by mouth. 11/24/17   [provider]  Spacer/Aero-Holding Josiah Lobo DEVI Use spacer with albuterol 11/11/17   Collene Gobble, MD  TADALAFIL PO Take 20 mg by mouth daily.     [provider]  tadalafil, PAH, (ADCIRCA) 20 MG tablet Take 20 mg by mouth every other day. 08/03/19   [provider]    Allergies    Elwyn Reach hcl] and Tape  Review of Systems   Review of Systems  Constitutional: Negative for chills and fever.  HENT: Negative for ear pain and sore throat.   Eyes: Negative for pain and visual disturbance.  Respiratory: Negative for cough and shortness of breath.   Cardiovascular: Negative for chest pain and palpitations.  Gastrointestinal: Negative for abdominal pain and vomiting.  Genitourinary: Negative for dysuria and hematuria.  Musculoskeletal: Negative for arthralgias and back pain.  Skin: Negative for color change and rash.  Neurological: Positive for weakness. Negative for dizziness, tremors, seizures, syncope, facial asymmetry, speech difficulty, light-headedness, numbness and headaches.  All other systems reviewed and are negative.   Physical Exam Updated Vital Signs BP 119/68    Pulse (!) 110    Resp (!) 24    LMP 11/15/2008    SpO2 (!) 86%   Physical Exam Vitals and nursing note reviewed.  Constitutional:      General: She is not in acute distress.    Appearance: She is well-developed. She is not ill-appearing, toxic-appearing or diaphoretic.  HENT:     Head: Normocephalic and atraumatic.     Nose: Nose normal.     Mouth/Throat:     Mouth: Mucous membranes are moist.     Pharynx: Oropharynx is clear.  Eyes:     Conjunctiva/sclera: Conjunctivae normal.   Cardiovascular:     Rate and Rhythm: Normal rate and regular rhythm.     Pulses: Normal pulses.     Heart sounds: Normal heart sounds. No murmur heard.   Pulmonary:     Effort: Pulmonary effort is normal. No respiratory distress.     Breath sounds: Normal breath sounds.  Abdominal:     General: Abdomen is flat.     Palpations: Abdomen is soft.     Tenderness: There is no abdominal tenderness.  Musculoskeletal:        General: No tenderness. Normal range of motion.     Cervical back: Neck supple.     Right lower leg: No edema.     Left lower leg: No edema.  Skin:    General: Skin is warm and dry.     Capillary Refill: Capillary refill takes less than 2 seconds.     Findings: No erythema.  Neurological:     General: No focal deficit present.     Mental Status: She is alert and oriented to person, place, and time.     Sensory: No sensory deficit.     Motor: Weakness present.     Gait: Gait normal.     Comments: Mental Status:  Alert, thought content appropriate, able to give a coherent history. Speech fluent without evidence of aphasia. Able to follow 2 step commands without difficulty.  Cranial Nerves:  II: Peripheral visual fields grossly normal, pupils equal, round, reactive to light III,IV, VI: ptosis not present, extra-ocular motions intact bilaterally  V,VII: smile symmetric, facial light touch sensation equal VIII: hearing grossly normal to voice  X: uvula elevates symmetrically  XI: bilateral shoulder shrug symmetric and strong XII: midline  tongue extension without fassiculations Motor:  Normal tone. Cannot lift right arm or right leg. Pain and gross sensations intact. Intact pedal and radial pulses, <2 cap refill. Cannot move digits on both right arm and leg. Does not withdraw from imminent threat. Bicep and patellar reflexes intact  Sensory: light touch normal in all extremities. Cerebellar: exam limited by inability to move right arm and leg.  Gait: not  accessed    Psychiatric:        Mood and Affect: Mood normal.        Behavior: Behavior normal.     ED Results / Procedures / Treatments   Labs (all labs ordered are listed, but only abnormal results are displayed) Labs Reviewed  PROTIME-INR - Abnormal; Notable for the following components:      Result Value   Prothrombin Time 19.0 (*)    INR 1.7 (*)    All other components within normal limits  APTT - Abnormal; Notable for the following components:   aPTT 38 (*)    All other components within normal limits  CBC - Abnormal; Notable for the following components:   WBC 3.3 (*)    RBC 3.65 (*)    Hemoglobin 11.1 (*)    HCT 34.6 (*)    RDW 17.9 (*)    Platelets 131 (*)    All other components within normal limits  COMPREHENSIVE METABOLIC PANEL - Abnormal; Notable for the following components:   BUN 42 (*)    Creatinine, Ser 6.18 (*)    Calcium 8.8 (*)    Albumin 2.9 (*)    Alkaline Phosphatase 152 (*)    GFR, Estimated 7 (*)    All other components within normal limits  I-STAT CHEM 8, ED - Abnormal; Notable for the following components:   BUN 47 (*)    Creatinine, Ser 6.30 (*)    Calcium, Ion 1.02 (*)    All other components within normal limits  CBG MONITORING, ED - Abnormal; Notable for the following components:   Glucose-Capillary 22 (*)    All other components within normal limits  CBG MONITORING, ED - Abnormal; Notable for the following components:   Glucose-Capillary 25 (*)    All other components within normal limits  ETHANOL  DIFFERENTIAL  RAPID URINE DRUG SCREEN, HOSP PERFORMED  URINALYSIS, ROUTINE W REFLEX MICROSCOPIC  HIV ANTIBODY (ROUTINE TESTING W REFLEX)  HEMOGLOBIN A1C  LIPID PANEL  CBG MONITORING, ED    EKG EKG Interpretation  Date/Time:  Wednesday December 12 2019 14:30:39 EST Ventricular Rate:  92 PR Interval:    QRS Duration: 78 QT Interval:  381 QTC Calculation: 472 R Axis:   119 Text Interpretation: Sinus rhythm Probable right  ventricular hypertrophy Borderline T abnormalities, diffuse leads Confirmed by Lennice Sites [phone]) on 12/12/2019 5:15:39 PM   Radiology DG Skull 1-3 Views  Result Date: 12/12/2019 CLINICAL DATA:  Encounter for foreign body in soft tissue. Left arm restricted for dialysis. Went to bed at midnight, woke up at 1 pm today. Flaccid right arm and leg. No vision changes. No facial droop. No withdrawal from on right leg and arm but can feel the pain. Pt does not make urine. No other complaints, headache, or dizziness. EXAM: SKULL - 1-3 VIEW COMPARISON:  None. FINDINGS: No skull fracture or lesion. No radiopaque foreign body.  Soft tissues are unremarkable. IMPRESSION: Negative. Electronically Signed   By: Lajean Manes M.D.   On: 12/12/2019 16:59   CT HEAD WO CONTRAST  Result  Date: 12/12/2019 CLINICAL DATA:  Right-sided weakness EXAM: CT HEAD WITHOUT CONTRAST TECHNIQUE: Contiguous axial images were obtained from the base of the skull through the vertex without intravenous contrast. COMPARISON:  None. FINDINGS: Brain: There is acute parenchymal hemorrhage within the posterior left frontal lobe involving the superior frontal and precentral gyri. This measures approximately 2.9 x 1.7 x 2.9 cm. Adjacent subcentimeter focus of hemorrhage. Mild surrounding edema with minor regional mass effect. Gray-white differentiation is preserved. Ventricles and sulci are within normal limits in size and configuration. Vascular: There is atherosclerotic calcification at the skull base. Skull: Calvarium is unremarkable. Sinuses/Orbits: No acute finding. Other: Mastoid air cells are clear. Multiple punctate bilateral parotid calculi. IMPRESSION: Acute left frontal parenchymal hemorrhage with mild edema. No significant mass effect. Critical Value/emergent results were called by telephone at the time of interpretation on 12/12/2019 at 5:17 pm to provider [facility] , who verbally acknowledged these results. Electronically Signed    By: Macy Mis M.D.   On: 12/12/2019 17:22    Procedures .Critical Care Performed by: Garald Balding, PA-C Authorized by: Garald Balding, PA-C   Critical care provider statement:    Critical care time (minutes):  45   Critical care was necessary to treat or prevent imminent or life-threatening deterioration of the following conditions:  CNS failure or compromise   Critical care was time spent personally by me on the following activities:  Discussions with consultants, evaluation of patient's response to treatment, examination of patient, ordering and performing treatments and interventions, ordering and review of laboratory studies, ordering and review of radiographic studies, pulse oximetry, re-evaluation of patient's condition, obtaining history from patient or surrogate and review of old charts   (including critical care time)   Medications Ordered in ED Medications   stroke: mapping our early stages of recovery book (has no administration in time range)  acetaminophen (TYLENOL) tablet 650 mg (has no administration in time range)    Or  acetaminophen (TYLENOL) 160 MG/5ML solution 650 mg (has no administration in time range)    Or  acetaminophen (TYLENOL) suppository 650 mg (has no administration in time range)  senna-docusate (Senokot-S) tablet 1 tablet (has no administration in time range)  pantoprazole (PROTONIX) injection 40 mg (has no administration in time range)  labetalol (NORMODYNE) injection 20 mg (has no administration in time range)    And  clevidipine (CLEVIPREX) infusion 0.5 mg/mL (has no administration in time range)  prothrombin complex conc human (KCENTRA) IVPB 1,568 Units (has no administration in time range)  phytonadione (VITAMIN K) 10 mg in dextrose 5 % 50 mL IVPB (has no administration in time range)  dextrose 50 % solution 50 mL (50 mLs Intravenous Given 12/12/19 1515)  LORazepam (ATIVAN) injection 0.5 mg (0.5 mg Intravenous Given 12/12/19 1552)   labetalol (NORMODYNE) injection 15 mg (15 mg Intravenous Given 12/12/19 1727)    ED Course  I have reviewed the triage vital signs and the nursing notes.  Pertinent labs & imaging results that were available during my care of the patient were reviewed by me and considered in my medical decision making (see chart for details).    MDM Rules/Calculators/A&P                         64 year old female with inability to move her right arm or leg.  On arrival, she is alert, oriented, no VAN signs.  She has sensations in her right arm and right leg, but the  extremities are flaccid.  Vitals on arrival with a blood pressure 144/77, normal pulse, respiratory rate and O2.  Patient was also seen by my supervising physician Dr. Stark Jock and we decided to activate a code stroke as the patient was within a 24-hour window.  Patient was seen and evaluated by Dr. Rory Percy who canceled the code stroke.  He requested MRI of the brain and MRA, as well as CT of the head.    Labs reviewed and interpreted by me -CBC with leukopenia 3.3, hemoglobin 11.1 -CMP without any electrode abnormalities, creatinine consistent with ESRD. -Patient with a subtherapeutic INR of 1.7  Imaging reviewed and interpreted by radiology, myself -Received critical call from radiology with reports of acute left parenchymal hemorrhage.   MDM: As per discussed with Dr. Malen Gauze, will reverse warfarin, blood pressure up to 170/1 1, will require nadolol and start on Cleviprex. .   Neurosurgery consulted.  Dr. Rory Percy will admit the patient.  Patient remained clinically stable at this time.  Monitor BPs at admission.  Pt was seen and evaluated by my supervising physician Dr. Stark Jock who is agreeable to the above plan and disposition.  Final Clinical Impression(s) / ED Diagnoses Final diagnoses:  Intracranial hemorrhage ([place])  Hypertension, unspecified type    Rx / DC Orders ED Discharge Orders    None        Garald Balding, PA-C 12/12/19  1749    Veryl Speak, MD 12/13/19 [phone]

## 2019-12-12 NOTE — Progress Notes (Signed)
Pharmacy contacted again. K Jaclyn Prime is being made.

## 2019-12-12 NOTE — Consult Note (Deleted)
Neurology Consultation  Reason for Consult: Stroke Referring Physician: Dr. Stark Jock  CC: Right-sided weakness  History is obtained from: Patient, chart  HPI: Katie Nunez is a 64 y.o. female past medical history of ESRD on hemodialysis Tuesday Thursday Saturday, fibromyalgia, hypertension, mixed connective tissue disorder, scleroderma pulmonary hypertension, atrial fibrillation, cirrhosis-on Coumadin for portal vein thrombosis and atrial fibrillation, presents for evaluation of sudden onset of right upper and lower extremity weakness. She says she was last normal around 5 AM this morning when she went to bed, and woke up around noon with inability to move her right arm and leg.  Never had these symptoms before. She is usually in nocturnal person and stays awake at night and sleeps during the early part of the morning. Denies any headaches.  Denies visual changes.  Denies any speech related changes-slurring or difficulty with word finding. She had a dialysis done yesterday as per her usual schedule.  She is compliant with medications. She is a retired Games developer. Denies headache nausea vomiting.  Code stroke was activated by the ED provider but given that she was outside the TPA window there was no LVO, code stroke was canceled. She was sent in for stat MRI of the brain with suspicion for possible pure motor lacunar infarct -which was unable to be completed initially due to some artifact requiring clearance in terms of any foreign body using an x-ray.  As the x-ray was completed, she was taken for stat CT head which showed the left frontal ICH.   LKW: 0500 hrs. 12/12/2019 tpa given?: no, ICH Premorbid modified Rankin scale (mRS): 0 ICH score: 0  ROS: Performed and negative except as noted in HPI  Past Medical History:  Diagnosis Date  . Achalasia   . Anxiety   . Dysplasia of cervix, low grade (CIN 1)   . Environmental allergies    "all year long"  (12/27/2016)  . ESRD (end stage renal disease) on dialysis [facility])    "TTS; [place]" (12/27/2016)  . Fibromyalgia   . GERD (gastroesophageal reflux disease)   . Gout   . Hypertension   . IBS (irritable bowel syndrome)   . MVP (mitral valve prolapse)   . RA (rheumatoid arthritis) (HCC)    FOLLOWED BY DR. SHANAHAN  . Raynaud's disease   . Scleroderma ([place])   . Seasonal allergies   . Thrombocytopenia ([place]) 07/01/2016   Acute fall to 13,000 07/01/16  . Tubular adenoma 01/08/2008   CECUM  . Vitamin D deficiency     Family History  Problem Relation Age of Onset  . Hypertension Mother   . Diabetes Mother   . Heart disease Father   . Hypertension Maternal Aunt   . Diabetes Maternal Grandmother   . Heart disease Paternal Grandfather   . Cerebral palsy Cousin        1ST COUSIN?  . Diabetes Paternal Grandmother      Social History:   reports that she has never smoked. She has never used smokeless tobacco. She reports that she does not drink alcohol and does not use drugs.  Medications  Current Facility-Administered Medications:  .  dextrose 50 % solution 50 mL, 1 ampule, Intravenous, Once, Belaya, Maria A, PA-C .  dextrose 50 % solution, , , ,  .  LORazepam (ATIVAN) injection 0.5 mg, 0.5 mg, Intravenous, Once, Amie Portland, MD  Current Outpatient Medications:  .  acetaminophen (TYLENOL) 500 MG tablet, Take 500-1,000 mg by mouth every 6 (six) hours  as needed (for pain/headaches.). , Disp: , Rfl:  .  ambrisentan (LETAIRIS) 5 MG tablet, Take by mouth., Disp: , Rfl:  .  calcium acetate (PHOSLO) 667 MG capsule, Take 667 mg by mouth 5 (five) times daily., Disp: , Rfl:  .  cephALEXin (KEFLEX) 500 MG capsule, Take 1 capsule (500 mg total) by mouth 2 (two) times daily. On dialysis days, take dose after dialysis., Disp: 28 capsule, Rfl: 0 .  cinacalcet (SENSIPAR) 30 MG tablet, Take 30 mg by mouth at bedtime. , Disp: , Rfl:  .  clonazePAM (KLONOPIN) 0.5 MG tablet, Take 0.5 mg by mouth 2  (two) times daily as needed for anxiety. , Disp: , Rfl:  .  clotrimazole-betamethasone (LOTRISONE) cream, Apply 1 application topically 2 (two) times daily. Apply to itchy area under toes twice a day, Disp: 45 g, Rfl: 2 .  cyclobenzaprine (FLEXERIL) 5 MG tablet, Take 1 tablet (5 mg total) by mouth 3 (three) times daily as needed for muscle spasms. (Patient taking differently: Take 10 mg by mouth 3 (three) times daily as needed for muscle spasms. ), Disp: 30 tablet, Rfl: 0 .  docusate sodium (COLACE) 100 MG capsule, Take 2 capsules (200 mg total) by mouth 2 (two) times daily. (Patient taking differently: Take 200 mg by mouth 2 (two) times daily as needed (for constipation). ), Disp: 10 capsule, Rfl: 0 .  ferric citrate (AURYXIA) 1 GM 210 MG(Fe) tablet, Take 420 mg by mouth 3 (three) times daily with meals. , Disp: , Rfl:  .  fluticasone (FLONASE) 50 MCG/ACT nasal spray, Place 1 spray into the nose daily., Disp: , Rfl:  .  gabapentin (NEURONTIN) 100 MG capsule, Take 100 mg by mouth daily as needed (pain). , Disp: , Rfl:  .  gentamicin ointment (GARAMYCIN) 0.1 %, Apply to toe after soaking twice daily, Disp: 30 g, Rfl: 0 .  hydrOXYzine (ATARAX/VISTARIL) 25 MG tablet, Take 25 mg by mouth every 8 (eight) hours as needed., Disp: , Rfl:  .  ipratropium (ATROVENT) 0.06 % nasal spray, Place 2 sprays into both nostrils 4 (four) times daily. (Patient not taking: Reported on 08/27/2019), Disp: , Rfl:  .  lactulose (CHRONULAC) 10 GM/15ML solution, SMARTSIG:15 Milliliter(s) By Mouth 3-4 Times Daily, Disp: , Rfl:  .  levocetirizine (XYZAL) 5 MG tablet, Take 5 mg by mouth every evening., Disp: , Rfl:  .  loperamide (IMODIUM) 2 MG capsule, Take 2-4 mg by mouth 3 (three) times daily as needed for diarrhea or loose stools., Disp: , Rfl:  .  metoprolol succinate (TOPROL-XL) 25 MG 24 hr tablet, Take 12.5 mg by mouth daily., Disp: , Rfl:  .  metoprolol tartrate (LOPRESSOR) 25 MG tablet, Take by mouth. (Patient not taking:  Reported on 08/27/2019), Disp: , Rfl:  .  montelukast (SINGULAIR) 10 MG tablet, Take 10 mg by mouth daily as needed (allergy symptoms).  (Patient not taking: Reported on 08/27/2019), Disp: , Rfl: 1 .  multivitamin (RENA-VIT) TABS tablet, Take 1 tablet by mouth daily.  (Patient not taking: Reported on 08/27/2019), Disp: , Rfl:  .  pantoprazole (PROTONIX) 40 MG tablet, TAKE 1 TABLET(40 MG) BY MOUTH DAILY (Patient not taking: Reported on 08/27/2019), Disp: 90 tablet, Rfl: 1 .  phenylephrine (NEO-SYNEPHRINE) 0.5 % nasal solution, Place 1 drop into both nostrils every 6 (six) hours as needed for congestion., Disp: 15 mL, Rfl: 1 .  Selexipag (UPTRAVI) 200 MCG TABS, 600 mg 2 (two) times daily, Disp: , Rfl:  .  sevelamer carbonate (  RENVELA) 800 MG tablet, Take by mouth., Disp: , Rfl:  .  Spacer/Aero-Holding Chambers DEVI, Use spacer with albuterol, Disp: 1 each, Rfl: 0 .  TADALAFIL PO, Take 20 mg by mouth daily. , Disp: , Rfl:  .  tadalafil, PAH, (ADCIRCA) 20 MG tablet, Take 20 mg by mouth every other day., Disp: , Rfl:   Exam: Current vital signs: BP (!) 154/86   Pulse 93   Resp (!) 22   LMP 11/15/2008   SpO2 97%  Vital signs in last 24 hours: Pulse Rate:  [93] 93 (12/01 1445) Resp:  [22] 22 (12/01 1445) BP: (154)/(86) 154/86 (12/01 1445) SpO2:  [97 %] 97 % (12/01 1445)  GENERAL: Awake, alert in NAD HEENT: - Normocephalic and atraumatic, dry mm, no LN++, no Thyromegally LUNGS - Clear to auscultation bilaterally with no wheezes CV - S1S2 RRR, no m/r/g, equal pulses bilaterally. ABDOMEN - Soft, nontender, nondistended with normoactive BS Ext: Distal discoloration of both extremities, left Achilles has an open wound.  NEURO:  Mental Status: AA&Ox3  Language: speech is nondysarthric.  Naming, repetition, fluency, and comprehension intact. Cranial Nerves: PERRL. EOMI, visual fields full, mild right nasolabial fold flattening, facial sensation intact, hearing intact, tongue/uvula/soft palate  midline. No evidence of tongue atrophy or fibrillations. Nearly absent right shoulder shrug Motor: Flaccid right upper and lower extremity with no movement appreciated.  Left side normal. Tone: is flaccid on the right upper and lower extremity.  Normal on the left Sensation-intact to light touch all over and without extinction Coordination: FTN intact on the left. Gait- deferred  NIHSS 1a Level of Conscious.: 0 1b LOC Questions: 0 1c LOC Commands:0  2 Best Gaze: 0 3 Visual: 0 4 Facial Palsy:1  5a Motor Arm - left:0  5b Motor Arm - Right:4  6a Motor Leg - Left: 0 6b Motor Leg - Right:4 7 Limb Ataxia: 0 8 Sensory: 0 9 Best Language: 0 10 Dysarthria: 0 11 Extinct. and Inatten.:0  TOTAL: 9  Labs I have reviewed labs in epic and the results pertinent to this consultation are:  CBC    Component Value Date/Time   WBC 3.9 (L) 08/17/2019 0619   RBC 3.43 (L) 08/17/2019 0619   HGB 12.2 12/12/2019 1512   HGB 10.9 (L) 08/09/2019 1451   HGB 12.1 05/30/2017 1048   HCT 36.0 12/12/2019 1512   HCT 36.0 05/30/2017 1048   PLT 136 (L) 08/17/2019 0619   PLT 129 (L) 08/09/2019 1451   PLT 247 05/30/2017 1048   MCV 95.3 08/17/2019 0619   MCV 90 05/30/2017 1048   MCH 29.7 08/17/2019 0619   MCHC 31.2 08/17/2019 0619   RDW 16.3 (H) 08/17/2019 0619   RDW 16.5 (H) 05/30/2017 1048   LYMPHSABS 0.5 (L) 08/17/2019 0619   LYMPHSABS 0.8 05/30/2017 1048   MONOABS 0.4 08/17/2019 0619   EOSABS 0.0 08/17/2019 0619   EOSABS 0.0 05/30/2017 1048   BASOSABS 0.0 08/17/2019 0619   BASOSABS 0.0 05/30/2017 1048    CMP     Component Value Date/Time   NA 141 12/12/2019 1512   NA 140 09/26/2017 1048   K 4.4 12/12/2019 1512   CL 102 12/12/2019 1512   CO2 29 08/17/2019 0619   GLUCOSE 85 12/12/2019 1512   BUN 47 (H) 12/12/2019 1512   BUN 47 (H) 09/26/2017 1048   CREATININE 6.30 (H) 12/12/2019 1512   CALCIUM 9.2 08/17/2019 0619   PROT 6.1 (L) 07/27/2018 0946   PROT 6.3 09/26/2017 1048  ALBUMIN 2.8  (L) 07/27/2018 0946   ALBUMIN 3.8 09/26/2017 1048   AST 24 07/27/2018 0946   ALT 14 07/27/2018 0946   ALKPHOS 109 07/27/2018 0946   BILITOT 0.9 07/27/2018 0946   BILITOT 0.3 09/26/2017 1048   GFRNONAA 8 (L) 08/17/2019 0619   GFRAA 9 (L) 08/17/2019 0619   INR 1.7  Imaging I have reviewed the images obtained: CTH with left frontal ICH, no shift, no IVH. Likely coagulopathic from coumadin  Assessment: 64 year old with hypertension, ESRD, mixed connective tissue disease, hypertension, hyperlipidemia, presenting with sudden onset of right upper and lower extremity weakness with last known normal at 5 AM this morning. Exam consistent with right-sided hemiplegia with pure motor symptoms.  CTH with left frontal ICH - likely coagulopathic from coumadin. INR 1.7  As soon as the CT head images were reviewed, pharmacist was notified to reverse. Stroke response RN was also notified to assist with blood pressure management.  Impression: -ICH-likely secondary to coumadin  Plan: Subcortical ICH, nontraumatic  Acuity: Acute Laterality: LEFT frontal Current suspected etiology: Coagulopathy from Coumadin Treatment:  -Admit to neurological ICU -ICH Score: 0 -BP control goal SYS<140 -PT/OT/ST  -neuromonitoring  CNS Cerebral edema Minimal local mass effect -Close neuro monitoring  Hemiplegia and hemiparesis following nontraumatic intracerebral hemorrhage affecting right dominant side  -Continue PT/OT/ST  RESP No active issues Monitor clinically  CV Hypertensive emergency -Aggressive blood pressure control-systolic goal less than 409. -Labetalol and Cleviprex as needed  GI/GU ESRD -Continue dialysis  HEME I anemia of chronic disease Monitor and transfuse if less than 7  Coagulopathy secondary to anticoagulation Thrombocytopenia -Goal INR is 1.5 or less -Reverse with [place] --No need for platelet transfusion -Trend PT/PTT/INR  ENDO Check A1c  Fluid/Electrolyte  Disorders Check labs and replete as necessary  ID History of lower extremity cellulitis with skin breakdown Wound consult   Prophylaxis DVT: SCDs GI: PPI Bowel: Docusate senna  Dispo: IP Rehab likely  Diet: NPO until cleared by speech  Code Status: Full Code    THE FOLLOWING WERE PRESENT ON ADMISSION: ICH, cirrhosis, scleroderma, mixed connective tissue disorder, coagulopathy due to being on Coumadin, ESRD,  -- Amie Portland, MD [facility] Pager: [phone] If 7pm to 7am, please call on call as listed on AMION.   CRITICAL CARE ATTESTATION Performed by: Amie Portland, MD Total critical care time: 45 minutes Critical care time was exclusive of separately billable procedures and treating other patients and/or supervising APPs/Residents/Students Critical care was necessary to treat or prevent imminent or life-threatening deterioration due to [place] This patient is critically ill and at significant risk for neurological worsening and/or death and care requires constant monitoring. Critical care was time spent personally by me on the following activities: development of treatment plan with patient and/or surrogate as well as nursing, discussions with consultants, evaluation of patient's response to treatment, examination of patient, obtaining history from patient or surrogate, ordering and performing treatments and interventions, ordering and review of laboratory studies, ordering and review of radiographic studies, pulse oximetry, re-evaluation of patient's condition, participation in multidisciplinary rounds and medical decision making of high complexity in the care of this patient.   Addendum Patient was more drowsy than before.  She had also received Ativan for the MRI. Otherwise exam unremarkable. Stat CT head ordered and reviewed.  No significant change. Mentation worsened likely due to Ativan. We will continue to closely monitor Repeat head CT at 6  AM  -- Amie Portland, MD [facility] Pager: [phone] If 7pm to  7am, please call on call as listed on AMION.  Additional 20 minutes of critical care time spent in care and evaluation of this patient.

## 2019-12-13 ENCOUNTER — Inpatient Hospital Stay (HOSPITAL_COMMUNITY): Payer: Medicare Other

## 2019-12-13 DIAGNOSIS — I611 Nontraumatic intracerebral hemorrhage in hemisphere, cortical: Secondary | ICD-10-CM | POA: Diagnosis not present

## 2019-12-13 LAB — CBC
HCT: 36.7 % (ref 36.0–46.0)
Hemoglobin: 11.5 g/dL — ABNORMAL LOW (ref 12.0–15.0)
MCH: 29.6 pg (ref 26.0–34.0)
MCHC: 31.3 g/dL (ref 30.0–36.0)
MCV: 94.6 fL (ref 80.0–100.0)
Platelets: 131 10*3/uL — ABNORMAL LOW (ref 150–400)
RBC: 3.88 MIL/uL (ref 3.87–5.11)
RDW: 18.4 % — ABNORMAL HIGH (ref 11.5–15.5)
WBC: 3.8 10*3/uL — ABNORMAL LOW (ref 4.0–10.5)
nRBC: 0 % (ref 0.0–0.2)

## 2019-12-13 LAB — MRSA PCR SCREENING: MRSA by PCR: NEGATIVE

## 2019-12-13 LAB — RENAL FUNCTION PANEL
Albumin: 2.8 g/dL — ABNORMAL LOW (ref 3.5–5.0)
Anion gap: 17 — ABNORMAL HIGH (ref 5–15)
BUN: 51 mg/dL — ABNORMAL HIGH (ref 8–23)
CO2: 24 mmol/L (ref 22–32)
Calcium: 8.9 mg/dL (ref 8.9–10.3)
Chloride: 100 mmol/L (ref 98–111)
Creatinine, Ser: 7.48 mg/dL — ABNORMAL HIGH (ref 0.44–1.00)
GFR, Estimated: 6 mL/min — ABNORMAL LOW (ref >60)
Glucose, Bld: 78 mg/dL (ref 70–99)
Phosphorus: 7.7 mg/dL — ABNORMAL HIGH (ref 2.5–4.6)
Potassium: 5.1 mmol/L (ref 3.5–5.1)
Sodium: 141 mmol/L (ref 135–145)

## 2019-12-13 LAB — PROTIME-INR
INR: 1.2 (ref 0.8–1.2)
Prothrombin Time: 15.1 seconds (ref 11.4–15.2)

## 2019-12-13 LAB — GLUCOSE, CAPILLARY
Glucose-Capillary: 74 mg/dL (ref 70–99)
Glucose-Capillary: 57 mg/dL — ABNORMAL LOW (ref 70–99)
Glucose-Capillary: 93 mg/dL (ref 70–99)
Glucose-Capillary: 69 mg/dL — ABNORMAL LOW (ref 70–99)
Glucose-Capillary: 73 mg/dL (ref 70–99)
Glucose-Capillary: 110 mg/dL — ABNORMAL HIGH (ref 70–99)
Glucose-Capillary: 100 mg/dL — ABNORMAL HIGH (ref 70–99)
Glucose-Capillary: 107 mg/dL — ABNORMAL HIGH (ref 70–99)
Glucose-Capillary: 87 mg/dL (ref 70–99)

## 2019-12-13 LAB — RESP PANEL BY RT-PCR (FLU A&B, COVID) ARPGX2
Influenza A by PCR: NEGATIVE
Influenza B by PCR: NEGATIVE
SARS Coronavirus 2 by RT PCR: NEGATIVE

## 2019-12-13 MED ORDER — SELEXIPAG 400 MCG PO TABS
800.0000 ug | ORAL_TABLET | Freq: Every day | ORAL | Status: DC
  Administered 2019-12-14 – 2019-12-16 (×3): 800 ug via ORAL
  Filled 2019-12-13 (×4): qty 60

## 2019-12-13 MED ORDER — DEXTROSE 50 % IV SOLN
12.5000 g | Freq: Once | INTRAVENOUS | Status: AC
  Administered 2019-12-13: 12.5 g via INTRAVENOUS
  Filled 2019-12-13: qty 50

## 2019-12-13 MED ORDER — ALTEPLASE 2 MG IJ SOLR: 2.0000 mg | Freq: Once | INTRAMUSCULAR | Status: DC | PRN

## 2019-12-13 MED ORDER — AMBRISENTAN 5 MG PO TABS
5.0000 mg | ORAL_TABLET | Freq: Every day | ORAL | Status: DC
  Filled 2019-12-13: qty 1

## 2019-12-13 MED ORDER — LIDOCAINE-PRILOCAINE 2.5-2.5 % EX CREA
1.0000 "application " | TOPICAL_CREAM | CUTANEOUS | Status: DC | PRN
  Filled 2019-12-13: qty 5

## 2019-12-13 MED ORDER — CALCIUM ACETATE (PHOS BINDER) 667 MG PO CAPS
667.0000 mg | ORAL_CAPSULE | Freq: Every day | ORAL | Status: DC
  Administered 2019-12-14: 667 mg via ORAL
  Filled 2019-12-13 (×5): qty 1

## 2019-12-13 MED ORDER — PENTOXIFYLLINE ER 400 MG PO TBCR
400.0000 mg | EXTENDED_RELEASE_TABLET | Freq: Three times a day (TID) | ORAL | Status: DC
  Administered 2019-12-15 – 2019-12-17 (×5): 400 mg via ORAL
  Filled 2019-12-13 (×13): qty 1

## 2019-12-13 MED ORDER — DOXERCALCIFEROL 4 MCG/2ML IV SOLN
6.0000 ug | INTRAVENOUS | Status: DC
  Filled 2019-12-13 (×3): qty 4

## 2019-12-13 MED ORDER — SODIUM CHLORIDE 0.9 % IV SOLN: 100.0000 mL | INTRAVENOUS | Status: DC | PRN

## 2019-12-13 MED ORDER — TADALAFIL 20 MG PO TABS
20.0000 mg | ORAL_TABLET | Freq: Every day | ORAL | Status: DC
  Filled 2019-12-13: qty 1

## 2019-12-13 MED ORDER — PENTAFLUOROPROP-TETRAFLUOROETH EX AERO: 1.0000 "application " | INHALATION_SPRAY | CUTANEOUS | Status: DC | PRN

## 2019-12-13 MED ORDER — TADALAFIL 20 MG PO TABS
20.0000 mg | ORAL_TABLET | Freq: Every day | ORAL | Status: DC
  Administered 2019-12-14 – 2019-12-18 (×5): 20 mg via ORAL
  Filled 2019-12-13 (×5): qty 1

## 2019-12-13 MED ORDER — LIDOCAINE HCL (PF) 1 % IJ SOLN: 5.0000 mL | INTRAMUSCULAR | Status: DC | PRN

## 2019-12-13 MED ORDER — AMBRISENTAN 5 MG PO TABS
5.0000 mg | ORAL_TABLET | Freq: Every day | ORAL | Status: DC
  Administered 2019-12-14 – 2019-12-18 (×5): 5 mg via ORAL
  Filled 2019-12-13 (×5): qty 1

## 2019-12-13 MED ORDER — TRAMADOL HCL 50 MG PO TABS
50.0000 mg | ORAL_TABLET | Freq: Two times a day (BID) | ORAL | Status: DC | PRN
  Administered 2019-12-16 – 2019-12-18 (×2): 50 mg via ORAL
  Filled 2019-12-13 (×2): qty 1

## 2019-12-13 MED ORDER — CLONAZEPAM 0.5 MG PO TABS
0.5000 mg | ORAL_TABLET | Freq: Two times a day (BID) | ORAL | Status: DC | PRN
  Administered 2019-12-15 – 2019-12-17 (×5): 0.5 mg via ORAL
  Filled 2019-12-13 (×6): qty 1

## 2019-12-13 MED ORDER — HEPARIN SODIUM (PORCINE) 1000 UNIT/ML DIALYSIS
1000.0000 [IU] | INTRAMUSCULAR | Status: DC | PRN
  Filled 2019-12-13: qty 1

## 2019-12-13 NOTE — Evaluation (Signed)
Speech Language Pathology Evaluation Patient Details Name: Katie Nunez MRN: [id number] DOB: Jul 15, 1955 Today's Date: 12/13/2019 Time: 4403-4742 SLP Time Calculation (min) (ACUTE ONLY): 23 min  Problem List:  Patient Active Problem List   Diagnosis Date Noted  . ICH (intracerebral hemorrhage) ([place]) 12/12/2019  . Viral disease 08/22/2019  . Chronic right-sided heart failure ([place]) 07/09/2019  . Supraventricular tachycardia ([place]) 07/09/2019  . Other cirrhosis of liver ([place]) 06/27/2019  . Heart failure ([place]) 02/21/2019  . Heart palpitations 08/14/2018  . Other fluid overload 07/27/2018  . Encounter for long-term (current) use of other medications 01/18/2018  . Hypothyroidism 01/18/2018  . Myalgia and myositis 01/18/2018  . Chronic nephritis 01/18/2018  . Other interstitial pulmonary diseases with fibrosis in diseases classified elsewhere ([place]) 01/18/2018  . Other long term (current) drug therapy 01/18/2018  . Sleep apnea 01/18/2018  . Unspecified persistent mental disorders due to conditions classified elsewhere 01/18/2018  . Venous reflux 01/18/2018  . Vitamin B12 deficiency 01/18/2018  . Vitamin D deficiency 01/18/2018  . Obstructive lung disease ([place]) 12/30/2017  . Other pruritus 12/27/2017  . Eruption cyst 12/19/2017  . 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 ([place]) 11/28/2017  . Chronic cough 11/11/2017  . Pulmonary hypertension ([place]) 11/11/2017  . Pulmonary arterial hypertension ([place]) 10/07/2017  . Acute ITP ([place]) 01/05/2017  . ESRD (end stage renal disease) ([place]) 12/28/2016  . Encounter for removal of sutures 09/09/2016  . Coagulation defect, unspecified ([employer]) 09/01/2016  . Underimmunization status 08/05/2016  . Hemolytic anemia ([place]) 07/26/2016  . Epistaxis, recurrent 07/26/2016  . Unspecified protein-calorie malnutrition ([place]) 07/13/2016  . Aftercare including intermittent dialysis ([place]) 07/07/2016  . Anemia in chronic kidney  disease 07/07/2016  . Hypokalemia 07/07/2016  . Iron deficiency anemia, unspecified 07/07/2016  . 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 Nonrheumatic mitral (valve) prolapse 07/07/2016  . Other irritable bowel syndrome 07/07/2016  . Other secondary thrombocytopenia 07/07/2016  . Secondary hyperparathyroidism of renal origin ([place]) 07/07/2016  . Thrombocytopenia ([place]) 07/01/2016  . ARF (acute renal failure) ([place]) 06/25/2016  . Anxiety 06/25/2016  . Bilateral carpal tunnel syndrome 10/22/2015  . Chronic gout without tophus 10/22/2015  . Chronic nonintractable headache 10/08/2015  . Fibroid uterus 01/03/2012  . H/O vitamin D deficiency 01/03/2012  . Post-menopausal 01/03/2012  . Hereditary and idiopathic peripheral neuropathy 11/19/2011  . Intestinal malabsorption 11/19/2011  . Lichen planus 59/56/3875  . Low back pain 11/19/2011  . Diffuse spasm of esophagus 11/11/2011  . ESR raised 11/11/2011  . Postinflammatory pulmonary fibrosis ([place]) 11/11/2011  . Scleroderma ([place]) 11/16/2010  . Rheumatoid arthritis ([place]) 11/16/2010  . Raynaud's disease 11/16/2010  . Symptomatic menopausal or female climacteric states 11/16/2010   Past Medical History:  Past Medical History:  Diagnosis Date  . Achalasia   . Anxiety   . Dysplasia of cervix, low grade (CIN 1)   . Environmental allergies    "all year long" (12/27/2016)  . ESRD (end stage renal disease) on dialysis [facility])    "TTS; [place]" (12/27/2016)  . Fibromyalgia   . GERD (gastroesophageal reflux disease)   . Gout   . Hypertension   . IBS (irritable bowel syndrome)   . MVP (mitral valve prolapse)   . RA (rheumatoid arthritis) (HCC)    FOLLOWED BY DR. SHANAHAN  . Raynaud's disease   . Scleroderma ([place])   . Seasonal allergies   . Thrombocytopenia ([place]) 07/01/2016   Acute fall to 13,000 07/01/16  . Tubular adenoma 01/08/2008   CECUM  . Vitamin D  deficiency    Past Surgical History:  Past Surgical History:  Procedure Laterality Date   . ANKLE FRACTURE SURGERY Right   . AV FISTULA PLACEMENT Left 06/28/2016   Procedure: left arm ARTERIOVENOUS (AV) FISTULA CREATION;  Surgeon: Rosetta Posner, MD;  Location: [place];  Service: Vascular;  Laterality: Left;  . BASCILIC VEIN TRANSPOSITION Left 09/27/2016   Procedure: LEFT UPPER ARM CEPHALIC VEIN TRANSPOSITION;  Surgeon: Rosetta Posner, MD;  Location: [place];  Service: Vascular;  Laterality: Left;  . BREAST BIOPSY     "? side"  . CESAREAN SECTION  1994  . CO2 LASER OF CERVIX    . COLONOSCOPY W/ BIOPSIES  01/08/2008  . INSERTION OF DIALYSIS CATHETER Right 06/28/2016   Procedure: INSERTION OF DIALYSIS CATHETER, right internal jugular;  Surgeon: Rosetta Posner, MD;  Location: [place];  Service: Vascular;  Laterality: Right;  . MYOMECTOMY    . PELVIC LAPAROSCOPY  2011  . superficial thrombophlebitis Left 07-2014   HPI:  Pt is a 64 y.o. female past medical history of ESRD on hemodialysis, fibromyalgia, hypertension, mixed connective tissue disorder, scleroderma pulmonary hypertension, atrial fibrillation, cirrhosis-on Coumadin for portal vein thrombosis and atrial fibrillation, who presented for evaluation of sudden onset of right upper and lower extremity weakness. MRI brain: acute hemorrhage in the subcortical left frontal white matter with rim of edema.    Assessment / Plan / Recommendation Clinical Impression  Pt participated in speech/language/cognition evaluation. Pt reported that she has a college-level education and is a retired Customer service manager. She denied any baseline or acute deficits in speech, language, or cognition and nursing denied observance of impairments. Pt stated that she lives with her husband at baseline and that she was independent with medication and financial management. Pt stated at the onset of the evaluation that "you're catching me at a bad time.Marland KitchenMarland KitchenI'm usually up all night and sleeping around this time and I've just had test after test so I'm tired." The impact of fatigue on her  performance is considered. Pt's speech and language skills were WNL without evidence of dysarthria, apraxia, or aphasia. Considering her level of baseline independence, the [facility] Mental Status Examination was also completed to evaluate the pt's cognitive-linguistic skills. She achieved a score of 22/30 which is below the normal limits of 27 or more out of 30 and is suggestive of a mild impairment. She exhibited difficulty with mental manipulation and executive function tasks. Pt attributed her difficulty to fatigue and this is certainly possible. However, considering her high level of independence at baseline, and pt's husband stating that she was "pretty sharp", SLP will initiate acute SLP services to target these areas.     SLP Assessment  SLP Recommendation/Assessment: Patient needs continued Speech Lanaguage Pathology Services SLP Visit Diagnosis: Cognitive communication deficit (R41.841)    Follow Up Recommendations   (TBD)    Frequency and Duration min 2x/week  2 weeks      SLP Evaluation Cognition  Overall Cognitive Status: Impaired/Different from baseline Arousal/Alertness: Awake/alert Orientation Level: Oriented X4 Attention: Focused;Sustained Focused Attention: Appears intact Sustained Attention: Appears intact Memory: Appears intact (Immediate: 5/5; delayed: 5/5; paragraph: 6/8) Awareness: Appears intact Problem Solving: Appears intact Executive Function: Reasoning;Sequencing;Organizing Reasoning: Appears intact Sequencing: Impaired Sequencing Impairment: Verbal complex (clock drawing: 2/4) Organizing: Impaired Organizing Impairment: Verbal complex (Backward digit span: 1/2) Safety/Judgment: Appears intact       Comprehension  Auditory Comprehension Overall Auditory Comprehension: Appears within functional limits for tasks assessed Yes/No Questions: Within Functional  Limits Paragraph Comprehension (via yes/no questions):  (6/8) Commands: Within  Functional Limits (2 & 3 step: 6/6) Conversation: Complex Reading Comprehension Reading Status: Within funtional limits    Expression Expression Primary Mode of Expression: Verbal Verbal Expression Overall Verbal Expression: Appears within functional limits for tasks assessed Initiation: No impairment Level of Generative/Spontaneous Verbalization: Conversation Repetition: No impairment Naming: No impairment Pragmatics: No impairment   Oral / Motor  Oral Motor/Sensory Function Overall Oral Motor/Sensory Function: Within functional limits Motor Speech Overall Motor Speech: Appears within functional limits for tasks assessed Respiration: Within functional limits Phonation: Normal Resonance: Within functional limits Articulation: Within functional limitis Intelligibility: Intelligible Motor Planning: Witnin functional limits Motor Speech Errors: Not applicable   Scotty Pinder I. Hardin Negus, [place], [place] Office number [phone] Pager [place] 12/13/2019, 10:00 AM

## 2019-12-13 NOTE — Progress Notes (Signed)
Attempted echo. Pt using bed pan and eating breakfast. Will try again later as schedule permits.

## 2019-12-13 NOTE — Evaluation (Signed)
Physical Therapy Evaluation Patient Details Name: Katie Nunez MRN: [id number] DOB: 02-27-1955 Today's Date: 12/13/2019   History of Present Illness  The pt is a 64 yo female presenting with acute R-sided weakness, found to have L frontal ICH. PMH includes: anxiety, ESRD on HD TTS, HTN, IBS, RA, fibromyalgia, a fib on Coumadin.     Clinical Impression  Pt in bed upon arrival of PT, agreeable to evaluation at this time. Prior to admission the pt was independent with mobility in her home, reports some use of a cane with longer distances and assist for stair navigation, and reports independence with increased time for ADLs. The pt now presents with limitations in functional mobility, strength, stability, coordination, and power due to above dx, and will continue to benefit from skilled PT to address these deficits. The pt was able to complete bed mobility and initial OOB transfers, but requires significant assist of 2 to complete safely due to poor functional strength in BLE (no active movement currently in RLE), poor wt shift and stability to allow for LLE stepping. The pt will continue to benefit from significant therapy to improve functional strength, stability, and capacity for transfers prior to return home where she has good family support.      Follow Up Recommendations CIR    Equipment Recommendations  Other (comment) (defer to post acute)    Recommendations for Other Services Rehab consult     Precautions / Restrictions Precautions Precautions: Fall Precaution Comments: Rt hemiplegia  Restrictions Weight Bearing Restrictions: No      Mobility  Bed Mobility Overal bed mobility: Needs Assistance Bed Mobility: Supine to Sit     Supine to sit: Mod assist;+2 for safety/equipment     General bed mobility comments: assist to move Rt LE off the bed, assist to roll to Lt and assist to lift trunk     Transfers Overall transfer level: Needs assistance Equipment used: 2  person hand held assist Transfers: Sit to/from Stand;Stand Pivot Transfers Sit to Stand: Mod assist;+2 physical assistance;+2 safety/equipment Stand pivot transfers: Mod assist;+2 physical assistance;+2 safety/equipment       General transfer comment: assist to boost hips from surface as well as facilitation for hip and knee extension as well as head/neck control and trunk extension. Rt knee buckled   Ambulation/Gait Ambulation/Gait assistance: Mod assist;+2 physical assistance Gait Distance (Feet): 3 Feet Assistive device: 1 person hand held assist Gait Pattern/deviations: Step-to pattern;Trunk flexed;Decreased stance time - right;Decreased dorsiflexion - right;Decreased weight shift to right Gait velocity: decreased Gait velocity interpretation: <1.31 ft/sec, indicative of household ambulator General Gait Details: sig assist to maintain stability, block R knee, and facilitate wt shift to allow for steppingwith LLE.    Modified Rankin (Stroke Patients Only) Modified Rankin (Stroke Patients Only) Pre-Morbid Rankin Score: Slight disability Modified Rankin: Moderately severe disability     Balance Overall balance assessment: Needs assistance Sitting-balance support: Feet supported Sitting balance-Leahy Scale: Poor Sitting balance - Comments: requires min A for static sitting EOB.  Looses balance to the Rt.  SHe requires mod A to lean forward to don sock on Lt foot  Postural control: Posterior lean;Right lateral lean Standing balance support: Single extremity supported;During functional activity Standing balance-Leahy Scale: Poor Standing balance comment: Pt requires mod A +2 to maintain standing.  Facilitation for hip and knee extension as well as facilitation for trunk extension  Pertinent Vitals/Pain Pain Assessment: No/denies pain    Home Living Family/patient expects to be discharged to:: Private residence Living Arrangements:  Spouse/significant other Available Help at Discharge: Family Type of Home: House Home Access: Stairs to enter   [employer] of Steps: 1 to enter garage, 2 at front (rails at front but pt reports she uses garage) Home Layout: Multi-level Home Equipment: Shower seat;Bedside commode;Cane - single point Additional Comments: spouse present to provide info.  pt has been sleeping on living room couch due to difficulty with stairs for 6-9 months.     Prior Function Level of Independence: Needs assistance   Gait / Transfers Assistance Needed: assistance for stairs, use of cane intermittently  ADL's / Homemaking Assistance Needed: pt stands to take shower, supervision for safety. assist for LB dressing to complete ADLs quickly, pt can do some independently. pt still driving short distances  Comments: Pt reports she drives self to HD.  She uses 2L during day, 3L O2 at night (pt states she leaves it on 2L: most days and nights)     Hand Dominance   Dominant Hand: Right    Extremity/Trunk Assessment   Upper Extremity Assessment Upper Extremity Assessment: Defer to OT evaluation RUE Deficits / Details: PROM WFL.  No active movement noted.  Flaccid  RUE Coordination: decreased fine motor;decreased gross motor    Lower Extremity Assessment Lower Extremity Assessment: RLE deficits/detail RLE Deficits / Details: PROM WFL, no clonus noted, no active movement or trace activation.  RLE Sensation: decreased light touch (pt reports minimal difference in sensation) RLE Coordination: decreased fine motor;decreased gross motor    Cervical / Trunk Assessment Cervical / Trunk Assessment: Kyphotic  Communication   Communication: No difficulties  Cognition Arousal/Alertness: Awake/alert Behavior During Therapy: WFL for tasks assessed/performed Overall Cognitive Status: Impaired/Different from baseline Area of Impairment: Attention;Memory;Following commands;Safety/judgement;Awareness;Problem  solving                   Current Attention Level: Selective Memory: Decreased short-term memory Following Commands: Follows one step commands consistently;Follows multi-step commands with increased time Safety/Judgement: Decreased awareness of deficits;Decreased awareness of safety Awareness: Intellectual Problem Solving: Difficulty sequencing;Requires verbal cues;Requires tactile cues General Comments: Pt requires cues for accuracy of PLOF.   She self distracts and requires cues to attend to task.         General Comments General comments (skin integrity, edema, etc.): VSS, husband present and supportive    Exercises     Assessment/Plan    PT Assessment Patient needs continued PT services  PT Problem List Decreased strength;Decreased activity tolerance;Decreased balance;Decreased mobility;Decreased coordination;Decreased safety awareness       PT Treatment Interventions DME instruction;Gait training;Stair training;Functional mobility training;Therapeutic activities;Therapeutic exercise;Neuromuscular re-education;Balance training;Patient/family education    PT Goals (Current goals can be found in the Care Plan section)  Acute Rehab PT Goals Patient Stated Goal: to be able to walk and to write  PT Goal Formulation: With patient Time For Goal Achievement: 12/27/19 Potential to Achieve Goals: Good    Frequency Min 4X/week        Co-evaluation PT/OT/SLP Co-Evaluation/Treatment: Yes Reason for Co-Treatment: Complexity of the patient's impairments (multi-system involvement);For patient/therapist safety;To address functional/ADL transfers PT goals addressed during session: Mobility/safety with mobility;Balance OT goals addressed during session: ADL's and self-care       AM-PAC PT "6 Clicks" Mobility  Outcome Measure Help needed turning from your back to your side while in a flat bed without using bedrails?: A Lot Help needed moving  from lying on your back to sitting  on the side of a flat bed without using bedrails?: A Lot Help needed moving to and from a bed to a chair (including a wheelchair)?: A Lot Help needed standing up from a chair using your arms (e.g., wheelchair or bedside chair)?: A Lot Help needed to walk in hospital room?: A Lot Help needed climbing 3-5 steps with a railing? : Total 6 Click Score: 11    End of Session Equipment Utilized During Treatment: Gait belt Activity Tolerance: Patient tolerated treatment well;Patient limited by fatigue Patient left: in chair;with call bell/phone within reach;with chair alarm set Nurse Communication: Mobility status PT Visit Diagnosis: Other abnormalities of gait and mobility (R26.89);Muscle weakness (generalized) (M62.81);Hemiplegia and hemiparesis Hemiplegia - Right/Left: Right Hemiplegia - dominant/non-dominant: Dominant Hemiplegia - caused by: Other Nontraumatic intracranial hemorrhage    Time: 1025-8527 PT Time Calculation (min) (ACUTE ONLY): 51 min   Charges:   PT Evaluation $PT Eval Moderate Complexity: 1 Mod          Karma Ganja, PT, DPT   Acute Rehabilitation Department Pager #: [phone]  Otho Bellows 12/13/2019, 2:30 PM

## 2019-12-13 NOTE — Progress Notes (Addendum)
Inpatient Rehab Admissions Coordinator Note:   Per therapy recommendations, pt was screened for CIR candidacy by Ossiel Marchio, MS CCC-SLP. At this time, Pt. Appears to have functional decline and is a good candidate for CIR. Will request order for rehab consult per protocol.  Please contact me with questions.   Tysin Salada, MS, CCC-SLP Rehab Admissions Coordinator  [phone] (celll) [phone] (office)  

## 2019-12-13 NOTE — Progress Notes (Signed)
Attempted echo a second time. Patient just now beginning dialysis in room. Will attempt later as schedule permits.

## 2019-12-13 NOTE — Evaluation (Signed)
Occupational Therapy Evaluation Patient Details Name: Katie Nunez MRN: [id number] DOB: 1955/05/18 Today's Date: 12/13/2019    History of Present Illness The pt is a 65 yo female presenting with acute R-sided weakness, found to have L frontal ICH. PMH includes: anxiety, ESRD on HD TTS, HTN, IBS, RA, fibromyalgia, a fib on Coumadin.    Clinical Impression   Pt admitted with above. She demonstrates the below listed deficits and will benefit from continued OT to maximize safety and independence with BADLs.  Pt presents to OT with Rt hemiplegia, impaired balance, decreased activity tolerance, and impaired cognition.  She currently requires set up - max A for ADLs and mod A +2 for functional mobility.  She lives with her spouse, who is very supportive.  She requires occasional assist with ADLs and is mod I for functional mobility.  She reports she still drives short distances.  Recommend CIR level rehab.       Follow Up Recommendations  CIR;Supervision/Assistance - 24 hour    Equipment Recommendations  None recommended by OT    Recommendations for Other Services Rehab consult     Precautions / Restrictions Precautions Precautions: Fall Precaution Comments: Rt hemiplegia       Mobility Bed Mobility Overal bed mobility: Needs Assistance Bed Mobility: Supine to Sit     Supine to sit: Mod assist;+2 for safety/equipment     General bed mobility comments: assist to move Rt LE off the bed, assist to roll to Lt and assist to lift trunk     Transfers Overall transfer level: Needs assistance Equipment used: 2 person hand held assist Transfers: Sit to/from Stand;Stand Pivot Transfers Sit to Stand: Mod assist;+2 physical assistance;+2 safety/equipment Stand pivot transfers: Mod assist;+2 physical assistance;+2 safety/equipment       General transfer comment: assist to boost hips from surface as well as facilitation for hip and knee extension as well as head/neck control and  trunk extension. Rt knee buckled     Balance Overall balance assessment: Needs assistance Sitting-balance support: Feet supported Sitting balance-Leahy Scale: Poor Sitting balance - Comments: requires min A for static sitting EOB.  Looses balance to the Rt.  SHe requires mod A to lean forward to don sock on Lt foot    Standing balance support: Single extremity supported;During functional activity Standing balance-Leahy Scale: Poor Standing balance comment: Pt requires mod A +2 to maintain standing.  Facilitation for hip and knee extension as well as facilitation for trunk extension                            ADL either performed or assessed with clinical judgement   ADL Overall ADL's : Needs assistance/impaired Eating/Feeding: Set up;Sitting;Bed level   Grooming: Wash/dry hands;Wash/dry face;Oral care;Brushing hair;Set up;Sitting   Upper Body Bathing: Moderate assistance;Sitting   Lower Body Bathing: Maximal assistance;Sit to/from stand   Upper Body Dressing : Maximal assistance;Sitting   Lower Body Dressing: Maximal assistance;Sit to/from stand Lower Body Dressing Details (indicate cue type and reason): able to bend forward to with mod A for balance, and mod A to pull socks on  Toilet Transfer: Moderate assistance;+2 for physical assistance;+2 for safety/equipment;Stand-pivot;BSC Toilet Transfer Details (indicate cue type and reason): Rt knee blocked, faciltiation at hips for extension, and to assist with balance and pivoting  Toileting- Clothing Manipulation and Hygiene: Total assistance;Sit to/from stand       Functional mobility during ADLs: Moderate assistance;+2 for physical assistance;+2 for safety/equipment  Vision Baseline Vision/History: Wears glasses Wears Glasses: Reading only Patient Visual Report: No change from baseline Vision Assessment?: Yes Eye Alignment: Within Functional Limits Ocular Range of Motion: Within Functional  Limits Alignment/Gaze Preference: Within Defined Limits Tracking/Visual Pursuits: Able to track stimulus in all quads without difficulty Visual Fields: No apparent deficits Additional Comments: will benefit from further assessment during function      Perception Perception Perception Tested?: Yes   Praxis Praxis Praxis tested?: Within functional limits    Pertinent Vitals/Pain Pain Assessment: No/denies pain     Hand Dominance Right   Extremity/Trunk Assessment Upper Extremity Assessment Upper Extremity Assessment: RUE deficits/detail RUE Deficits / Details: PROM WFL.  No active movement noted.  Flaccid  RUE Coordination: decreased fine motor;decreased gross motor   Lower Extremity Assessment Lower Extremity Assessment: Defer to PT evaluation       Communication     Cognition Arousal/Alertness: Awake/alert Behavior During Therapy: WFL for tasks assessed/performed Overall Cognitive Status: Impaired/Different from baseline Area of Impairment: Attention;Memory;Following commands;Safety/judgement;Awareness;Problem solving                   Current Attention Level: Selective Memory: Decreased short-term memory Following Commands: Follows one step commands consistently;Follows multi-step commands with increased time Safety/Judgement: Decreased awareness of deficits;Decreased awareness of safety Awareness: Intellectual Problem Solving: Difficulty sequencing;Requires verbal cues;Requires tactile cues General Comments: Pt requires cues for accuracy of PLOF.   She self distracts and requires cues to attend to task.      General Comments  VSS    Exercises     Shoulder Instructions      Home Living Family/patient expects to be discharged to:: Private residence Living Arrangements: Spouse/significant other Available Help at Discharge: Family Type of Home: House Home Access: Stairs to enter [employer] of Steps: 1 to enter garage, 2 at front (rails at  front but pt reports she uses garage)   Home Layout: Multi-level Alternate Level Stairs-Number of Steps: 12-15 Alternate Level Stairs-Rails: Left (wall on R) Bathroom Shower/Tub: Walk-in shower;Tub only   Bathroom Toilet: Handicapped height     Home Equipment: Shower seat;Bedside commode;Cane - single point   Additional Comments: spouse present to provide info.  pt has been sleeping on living room couch due to difficulty with stairs for 6-9 months.       Prior Functioning/Environment Level of Independence: Needs assistance  Gait / Transfers Assistance Needed: assistance for stairs, use of cane intermittently ADL's / Homemaking Assistance Needed: pt stands to take shower, supervision for safety. assist for LB dressing to complete ADLs quickly, pt can do some independently. pt still driving short distances   Comments: Pt reports she drives self to HD.  She uses 2L during day, 3L O2 at night (pt states she leaves it on 2L: most days and nights)        OT Problem List: Decreased strength;Decreased range of motion;Decreased activity tolerance;Impaired balance (sitting and/or standing);Impaired vision/perception;Decreased coordination;Decreased cognition;Decreased safety awareness;Decreased knowledge of use of DME or AE;Impaired tone;Impaired UE functional use;Impaired sensation      OT Treatment/Interventions: Self-care/ADL training;Therapeutic exercise;Neuromuscular education;DME and/or AE instruction;Therapeutic activities;Cognitive remediation/compensation;Visual/perceptual remediation/compensation;Patient/family education;Balance training;Manual therapy;Splinting    OT Goals(Current goals can be found in the care plan section) Acute Rehab OT Goals Patient Stated Goal: to be able to walk and to write  OT Goal Formulation: With patient Time For Goal Achievement: 12/27/19 Potential to Achieve Goals: Good ADL Goals Pt Will Perform Grooming: with mod assist;standing Pt Will Perform  Upper Body Bathing: with  set-up;with supervision;sitting Pt Will Perform Lower Body Bathing: with min assist;sit to/from stand Pt Will Perform Upper Body Dressing: with supervision;sitting Pt Will Perform Lower Body Dressing: with min assist;sit to/from stand Pt Will Transfer to Toilet: with min assist;stand pivot transfer;bedside commode Pt Will Perform Toileting - Clothing Manipulation and hygiene: with min assist;sit to/from stand Pt/caregiver will Perform Home Exercise Program: Increased ROM;Right Upper extremity;With Supervision;With written HEP provided Additional ADL Goal #1: Pt will use Rt UE as a stabilizer with min A during ADLs. .  OT Frequency: Min 2X/week   Barriers to D/C:            Co-evaluation PT/OT/SLP Co-Evaluation/Treatment: Yes Reason for Co-Treatment: Complexity of the patient's impairments (multi-system involvement);Necessary to address cognition/behavior during functional activity;For patient/therapist safety;To address functional/ADL transfers   OT goals addressed during session: ADL's and self-care      AM-PAC OT "6 Clicks" Daily Activity     Outcome Measure Help from another person eating meals?: A Little Help from another person taking care of personal grooming?: A Little Help from another person toileting, which includes using toliet, bedpan, or urinal?: A Lot Help from another person bathing (including washing, rinsing, drying)?: A Lot Help from another person to put on and taking off regular upper body clothing?: A Lot Help from another person to put on and taking off regular lower body clothing?: A Lot 6 Click Score: 14   End of Session Equipment Utilized During Treatment: Gait belt Nurse Communication: Mobility status  Activity Tolerance: Patient tolerated treatment well Patient left: in chair;with call bell/phone within reach;with chair alarm set;with nursing/sitter in room  OT Visit Diagnosis: Hemiplegia and hemiparesis Hemiplegia -  Right/Left: Right Hemiplegia - dominant/non-dominant: Dominant Hemiplegia - caused by: Nontraumatic intracerebral hemorrhage                Time: 8921-1941 OT Time Calculation (min): 53 min Charges:  OT General Charges $OT Visit: 1 Visit OT Evaluation $OT Eval Moderate Complexity: 1 Mod OT Treatments $Self Care/Home Management : 8-22 mins  Nilsa Nutting., OTR/L Acute Rehabilitation Services Pager [phone] Office [phone]   Lucille Passy M 12/13/2019, 1:47 PM

## 2019-12-13 NOTE — H&P (Signed)
Neurology Consultation  Reason for Consult: Stroke Referring Physician: Dr. Stark Jock  CC: Right-sided weakness  History is obtained from: Patient, chart  HPI: Katie Nunez is a 64 y.o. female past medical history of ESRD on hemodialysis Tuesday Thursday Saturday, fibromyalgia, hypertension, mixed connective tissue disorder, scleroderma pulmonary hypertension, atrial fibrillation, cirrhosis-on Coumadin for portal vein thrombosis and atrial fibrillation, presents for evaluation of sudden onset of right upper and lower extremity weakness. She says she was last normal around 5 AM this morning when she went to bed, and woke up around noon with inability to move her right arm and leg.  Never had these symptoms before. She is usually in nocturnal person and stays awake at night and sleeps during the early part of the morning. Denies any headaches.  Denies visual changes.  Denies any speech related changes-slurring or difficulty with word finding. She had a dialysis done yesterday as per her usual schedule.  She is compliant with medications. She is a retired Games developer. Denies headache nausea vomiting.  Code stroke was activated by the ED provider but given that she was outside the TPA window there was no LVO, code stroke was canceled. She was sent in for stat MRI of the brain with suspicion for possible pure motor lacunar infarct -which was unable to be completed initially due to some artifact requiring clearance in terms of any foreign body using an x-ray.  As the x-ray was completed, she was taken for stat CT head which showed the left frontal ICH.   LKW: 0500 hrs. 12/12/2019 tpa given?: no, ICH Premorbid modified Rankin scale (mRS): 0 ICH score: 0  ROS: Performed and negative except as noted in HPI      Past Medical History:  Diagnosis Date  . Achalasia   . Anxiety   . Dysplasia of cervix, low grade (CIN 1)   . Environmental allergies    "all  year long" (12/27/2016)  . ESRD (end stage renal disease) on dialysis [facility])    "TTS; [place] [facility]" (12/27/2016)  . Fibromyalgia   . GERD (gastroesophageal reflux disease)   . Gout   . Hypertension   . IBS (irritable bowel syndrome)   . MVP (mitral valve prolapse)   . RA (rheumatoid arthritis) (HCC)    FOLLOWED BY DR. SHANAHAN  . Raynaud's disease   . Scleroderma ([place])   . Seasonal allergies   . Thrombocytopenia ([place]) 07/01/2016   Acute fall to 13,000 07/01/16  . Tubular adenoma 01/08/2008   CECUM  . Vitamin D deficiency          Family History  Problem Relation Age of Onset  . Hypertension Mother   . Diabetes Mother   . Heart disease Father   . Hypertension Maternal Aunt   . Diabetes Maternal Grandmother   . Heart disease Paternal Grandfather   . Cerebral palsy Cousin        1ST COUSIN?  . Diabetes Paternal Grandmother      Social History:   reports that she has never smoked. She has never used smokeless tobacco. She reports that she does not drink alcohol and does not use drugs.  Medications  Current Facility-Administered Medications:  .  dextrose 50 % solution 50 mL, 1 ampule, Intravenous, Once, Belaya, Maria A, PA-C .  dextrose 50 % solution, , , ,  .  LORazepam (ATIVAN) injection 0.5 mg, 0.5 mg, Intravenous, Once, Amie Portland, MD  Current Outpatient Medications:  .  acetaminophen (TYLENOL) 500 MG tablet,  Take 500-1,000 mg by mouth every 6 (six) hours as needed (for pain/headaches.). , Disp: , Rfl:  .  ambrisentan (LETAIRIS) 5 MG tablet, Take by mouth., Disp: , Rfl:  .  calcium acetate (PHOSLO) 667 MG capsule, Take 667 mg by mouth 5 (five) times daily., Disp: , Rfl:  .  cephALEXin (KEFLEX) 500 MG capsule, Take 1 capsule (500 mg total) by mouth 2 (two) times daily. On dialysis days, take dose after dialysis., Disp: 28 capsule, Rfl: 0 .  cinacalcet (SENSIPAR) 30 MG tablet, Take 30 mg by mouth at bedtime. , Disp: , Rfl:  .  clonazePAM  (KLONOPIN) 0.5 MG tablet, Take 0.5 mg by mouth 2 (two) times daily as needed for anxiety. , Disp: , Rfl:  .  clotrimazole-betamethasone (LOTRISONE) cream, Apply 1 application topically 2 (two) times daily. Apply to itchy area under toes twice a day, Disp: 45 g, Rfl: 2 .  cyclobenzaprine (FLEXERIL) 5 MG tablet, Take 1 tablet (5 mg total) by mouth 3 (three) times daily as needed for muscle spasms. (Patient taking differently: Take 10 mg by mouth 3 (three) times daily as needed for muscle spasms. ), Disp: 30 tablet, Rfl: 0 .  docusate sodium (COLACE) 100 MG capsule, Take 2 capsules (200 mg total) by mouth 2 (two) times daily. (Patient taking differently: Take 200 mg by mouth 2 (two) times daily as needed (for constipation). ), Disp: 10 capsule, Rfl: 0 .  ferric citrate (AURYXIA) 1 GM 210 MG(Fe) tablet, Take 420 mg by mouth 3 (three) times daily with meals. , Disp: , Rfl:  .  fluticasone (FLONASE) 50 MCG/ACT nasal spray, Place 1 spray into the nose daily., Disp: , Rfl:  .  gabapentin (NEURONTIN) 100 MG capsule, Take 100 mg by mouth daily as needed (pain). , Disp: , Rfl:  .  gentamicin ointment (GARAMYCIN) 0.1 %, Apply to toe after soaking twice daily, Disp: 30 g, Rfl: 0 .  hydrOXYzine (ATARAX/VISTARIL) 25 MG tablet, Take 25 mg by mouth every 8 (eight) hours as needed., Disp: , Rfl:  .  ipratropium (ATROVENT) 0.06 % nasal spray, Place 2 sprays into both nostrils 4 (four) times daily. (Patient not taking: Reported on 08/27/2019), Disp: , Rfl:  .  lactulose (CHRONULAC) 10 GM/15ML solution, SMARTSIG:15 Milliliter(s) By Mouth 3-4 Times Daily, Disp: , Rfl:  .  levocetirizine (XYZAL) 5 MG tablet, Take 5 mg by mouth every evening., Disp: , Rfl:  .  loperamide (IMODIUM) 2 MG capsule, Take 2-4 mg by mouth 3 (three) times daily as needed for diarrhea or loose stools., Disp: , Rfl:  .  metoprolol succinate (TOPROL-XL) 25 MG 24 hr tablet, Take 12.5 mg by mouth daily., Disp: , Rfl:  .  metoprolol tartrate (LOPRESSOR) 25  MG tablet, Take by mouth. (Patient not taking: Reported on 08/27/2019), Disp: , Rfl:  .  montelukast (SINGULAIR) 10 MG tablet, Take 10 mg by mouth daily as needed (allergy symptoms).  (Patient not taking: Reported on 08/27/2019), Disp: , Rfl: 1 .  multivitamin (RENA-VIT) TABS tablet, Take 1 tablet by mouth daily.  (Patient not taking: Reported on 08/27/2019), Disp: , Rfl:  .  pantoprazole (PROTONIX) 40 MG tablet, TAKE 1 TABLET(40 MG) BY MOUTH DAILY (Patient not taking: Reported on 08/27/2019), Disp: 90 tablet, Rfl: 1 .  phenylephrine (NEO-SYNEPHRINE) 0.5 % nasal solution, Place 1 drop into both nostrils every 6 (six) hours as needed for congestion., Disp: 15 mL, Rfl: 1 .  Selexipag (UPTRAVI) 200 MCG TABS, 600 mg 2 (two) times  daily, Disp: , Rfl:  .  sevelamer carbonate (RENVELA) 800 MG tablet, Take by mouth., Disp: , Rfl:  .  Spacer/Aero-Holding Chambers DEVI, Use spacer with albuterol, Disp: 1 each, Rfl: 0 .  TADALAFIL PO, Take 20 mg by mouth daily. , Disp: , Rfl:  .  tadalafil, PAH, (ADCIRCA) 20 MG tablet, Take 20 mg by mouth every other day., Disp: , Rfl:   Exam: Current vital signs: BP (!) 154/86   Pulse 93   Resp (!) 22   LMP 11/15/2008   SpO2 97%  Vital signs in last 24 hours: Pulse Rate:  [93] 93 (12/01 1445) Resp:  [22] 22 (12/01 1445) BP: (154)/(86) 154/86 (12/01 1445) SpO2:  [97 %] 97 % (12/01 1445)  GENERAL: Awake, alert in NAD HEENT: - Normocephalic and atraumatic, dry mm, no LN++, no Thyromegally LUNGS - Clear to auscultation bilaterally with no wheezes CV - S1S2 RRR, no m/r/g, equal pulses bilaterally. ABDOMEN - Soft, nontender, nondistended with normoactive BS Ext: Distal discoloration of both extremities, left Achilles has an open wound.  NEURO:  Mental Status: AA&Ox3  Language: speech is nondysarthric.  Naming, repetition, fluency, and comprehension intact. Cranial Nerves: PERRL. EOMI, visual fields full, mild right nasolabial fold flattening, facial sensation  intact, hearing intact, tongue/uvula/soft palate midline. No evidence of tongue atrophy or fibrillations. Nearly absent right shoulder shrug Motor: Flaccid right upper and lower extremity with no movement appreciated.  Left side normal. Tone: is flaccid on the right upper and lower extremity.  Normal on the left Sensation-intact to light touch all over and without extinction Coordination: FTN intact on the left. Gait- deferred  NIHSS 1a Level of Conscious.: 0 1b LOC Questions: 0 1c LOC Commands:0  2 Best Gaze: 0 3 Visual: 0 4 Facial Palsy:1  5a Motor Arm - left:0  5b Motor Arm - Right:4  6a Motor Leg - Left: 0 6b Motor Leg - Right:4 7 Limb Ataxia: 0 8 Sensory: 0 9 Best Language: 0 10 Dysarthria: 0 11 Extinct. and Inatten.:0  TOTAL: 9  Labs I have reviewed labs in epic and the results pertinent to this consultation are:  CBC Labs (Brief)          Component Value Date/Time   WBC 3.9 (L) 08/17/2019 0619   RBC 3.43 (L) 08/17/2019 0619   HGB 12.2 12/12/2019 1512   HGB 10.9 (L) 08/09/2019 1451   HGB 12.1 05/30/2017 1048   HCT 36.0 12/12/2019 1512   HCT 36.0 05/30/2017 1048   PLT 136 (L) 08/17/2019 0619   PLT 129 (L) 08/09/2019 1451   PLT 247 05/30/2017 1048   MCV 95.3 08/17/2019 0619   MCV 90 05/30/2017 1048   MCH 29.7 08/17/2019 0619   MCHC 31.2 08/17/2019 0619   RDW 16.3 (H) 08/17/2019 0619   RDW 16.5 (H) 05/30/2017 1048   LYMPHSABS 0.5 (L) 08/17/2019 0619   LYMPHSABS 0.8 05/30/2017 1048   MONOABS 0.4 08/17/2019 0619   EOSABS 0.0 08/17/2019 0619   EOSABS 0.0 05/30/2017 1048   BASOSABS 0.0 08/17/2019 0619   BASOSABS 0.0 05/30/2017 1048      CMP     Labs (Brief)          Component Value Date/Time   NA 141 12/12/2019 1512   NA 140 09/26/2017 1048   K 4.4 12/12/2019 1512   CL 102 12/12/2019 1512   CO2 29 08/17/2019 0619   GLUCOSE 85 12/12/2019 1512   BUN 47 (H) 12/12/2019 1512   BUN 47 (H)  09/26/2017 1048    CREATININE 6.30 (H) 12/12/2019 1512   CALCIUM 9.2 08/17/2019 0619   PROT 6.1 (L) 07/27/2018 0946   PROT 6.3 09/26/2017 1048   ALBUMIN 2.8 (L) 07/27/2018 0946   ALBUMIN 3.8 09/26/2017 1048   AST 24 07/27/2018 0946   ALT 14 07/27/2018 0946   ALKPHOS 109 07/27/2018 0946   BILITOT 0.9 07/27/2018 0946   BILITOT 0.3 09/26/2017 1048   GFRNONAA 8 (L) 08/17/2019 0619   GFRAA 9 (L) 08/17/2019 0619     INR 1.7  Imaging I have reviewed the images obtained: CTH with left frontal ICH, no shift, no IVH. Likely coagulopathic from coumadin  Assessment: 64 year old with hypertension, ESRD, mixed connective tissue disease, hypertension, hyperlipidemia, presenting with sudden onset of right upper and lower extremity weakness with last known normal at 5 AM this morning. Exam consistent with right-sided hemiplegia with pure motor symptoms.  CTH with left frontal ICH - likely coagulopathic from coumadin. INR 1.7  As soon as the CT head images were reviewed, pharmacist was notified to reverse. Stroke response RN was also notified to assist with blood pressure management.  Impression: -ICH-likely secondary to coumadin  Plan: Subcortical ICH, nontraumatic  Acuity: Acute Laterality: LEFT frontal Current suspected etiology: Coagulopathy from Coumadin Treatment:  -Admit to neurological ICU -ICH Score: 0 -BP control goal SYS<140 -PT/OT/ST  -neuromonitoring  CNS Cerebral edema Minimal local mass effect -Close neuro monitoring  Hemiplegia and hemiparesis following nontraumatic intracerebral hemorrhage affecting right dominant side  -Continue PT/OT/ST  RESP No active issues Monitor clinically  CV Hypertensive emergency -Aggressive blood pressure control-systolic goal less than 694. -Labetalol and Cleviprex as needed  GI/GU ESRD -Continue dialysis  HEME I anemia of chronic disease Monitor and transfuse if less than 7  Coagulopathy secondary to  anticoagulation Thrombocytopenia -Goal INR is 1.5 or less -Reverse with [place] --No need for platelet transfusion -Trend PT/PTT/INR  ENDO Check A1c  Fluid/Electrolyte Disorders Check labs and replete as necessary  ID History of lower extremity cellulitis with skin breakdown Wound consult   Prophylaxis DVT: SCDs GI: PPI Bowel: Docusate senna  Dispo: IP Rehab likely  Diet: NPO until cleared by speech  Code Status: Full Code    THE FOLLOWING WERE PRESENT ON ADMISSION: ICH, cirrhosis, scleroderma, mixed connective tissue disorder, coagulopathy due to being on Coumadin, ESRD,  -- Amie Portland, MD [facility] Pager: [phone] If 7pm to 7am, please call on call as listed on AMION.   CRITICAL CARE ATTESTATION Performed by: Amie Portland, MD Total critical care time: 45 minutes Critical care time was exclusive of separately billable procedures and treating other patients and/or supervising APPs/Residents/Students Critical care was necessary to treat or prevent imminent or life-threatening deterioration due to [place] This patient is critically ill and at significant risk for neurological worsening and/or death and care requires constant monitoring. Critical care was time spent personally by me on the following activities: development of treatment plan with patient and/or surrogate as well as nursing, discussions with consultants, evaluation of patient's response to treatment, examination of patient, obtaining history from patient or surrogate, ordering and performing treatments and interventions, ordering and review of laboratory studies, ordering and review of radiographic studies, pulse oximetry, re-evaluation of patient's condition, participation in multidisciplinary rounds and medical decision making of high complexity in the care of this patient.   Addendum Patient was more drowsy than before.  She had also received Ativan for the MRI. Otherwise  exam unremarkable. Stat CT head ordered and reviewed.  No  significant change. Mentation worsened likely due to Ativan. We will continue to closely monitor Repeat head CT at 6 AM  -- Amie Portland, MD [facility] Pager: [phone] If 7pm to 7am, please call on call as listed on AMION.  Additional 20 minutes of critical care time spent in care and evaluation of this patient.

## 2019-12-13 NOTE — Consult Note (Signed)
[facility] Nurse Consult Note: Reason for Consult: Consult requested for left leg wound.  Pt is well-informed regarding plan of care and states she had an I&D performed several weeks ago to this site and is has continued to improve.  Wound type: Full thickness wound to left posterior calf Measurement: 1.5X.3X.3cm Wound bed: red and moist Drainage (amount, consistency, odor) small amt tan drainage, no odor or fluctuance Periwound: intact skin surrounding Dressing procedure/placement/frequency: Topical treatment orders provided for bedside nurses to perform daily as was the previous plan of care: Pack left leg with a strip of Iodoform Q day, using swab to fill, then cover with foam dressing.  (Change foam dressing Q 3 days or PRN soiling.) Please re-consult if further assistance is needed.  Thank-you,  Julien Girt MSN, [place], [place], [place], [place]

## 2019-12-13 NOTE — Consult Note (Signed)
Physical Medicine and Rehabilitation Consult  Reason for Consult: ICH with functional deficits.  Referring Physician: Dr. Erlinda Hong   HPI: Katie Nunez is a 64 y.o. RH-female with history of ESRD-TTS, Raynaud's, scleroderma's, achalasia, cirrhosis, PAH- oxygen dependent, A fib who has had multiple recent admissions for  cdiff colitis/portal vein thrombosis 09/2019 this fall with addition of coumadin as well as LLE abscess 10/2019 s/p drainage.  She was admitted on 12/12/2019 with onset of LUE/LLE weakness.  No reports of speech deficits, headaches or N/V.  She was found to have right frontal ICH with cerebral edema and INR 1.7 admission.  The brain showed no hemorrhage subcortical left frontal white matter with a single rim of vasogenic edema and few remote hemorrhages in bilateral upper cerebellum.  Coumadin reversed with Kcentra and vitamin K and neurology recommended keeping SBP less than 140.  Follow-up CT head on 12/2 showed no change in [place] with surrounding edema. Speech therapy evaluation done revealing high-level cognitive deficits with mild impairment. Wound care consulted for management of left leg wound and recommended packing with iodoform gauze.  PT/ OT evaluation revealed right hemiplegia with impaired cognition, decreased activity tolerance and impaired balance affecting ADLs and mobility.  CIR was recommended due to functional decline.  Review of Systems  Constitutional: Negative for chills and fever.  HENT: Negative for hearing loss and tinnitus.   Eyes: Negative for blurred vision and double vision.  Respiratory: Negative for cough and shortness of breath.   Cardiovascular: Negative for palpitations and leg swelling.  Gastrointestinal: Positive for constipation (constipation alternating with incontinence). Negative for heartburn and nausea.  Musculoskeletal: Negative for back pain, joint pain and myalgias.  Skin: Negative for rash.  Neurological: Positive for focal weakness.  Negative for dizziness, tingling, sensory change and headaches.  Psychiatric/Behavioral: The patient has insomnia (stays up at nights and sleeps in ). The patient is not nervous/anxious.      Past Medical History:  Diagnosis Date   Achalasia    Anxiety    Dysplasia of cervix, low grade (CIN 1)    Environmental allergies    "all year long" (12/27/2016)   ESRD (end stage renal disease) on dialysis ([place])    "TTS; [place]" (12/27/2016)   Fibromyalgia    GERD (gastroesophageal reflux disease)    Gout    Hypertension    IBS (irritable bowel syndrome)    MVP (mitral valve prolapse)    RA (rheumatoid arthritis) ([place])    FOLLOWED BY DR. SHANAHAN   Raynaud's disease    Scleroderma ([place])    Seasonal allergies    Thrombocytopenia ([place]) 07/01/2016   Acute fall to 13,000 07/01/16   Tubular adenoma 01/08/2008   CECUM   Vitamin D deficiency     Past Surgical History:  Procedure Laterality Date   ANKLE FRACTURE SURGERY Right    AV FISTULA PLACEMENT Left 06/28/2016   Procedure: left arm ARTERIOVENOUS (AV) FISTULA CREATION;  Surgeon: Rosetta Posner, MD;  Location: [facility] OR;  Service: Vascular;  Laterality: Left;   Bandana Left 09/27/2016   Procedure: LEFT UPPER ARM CEPHALIC VEIN TRANSPOSITION;  Surgeon: Rosetta Posner, MD;  Location: [facility];  Service: Vascular;  Laterality: Left;   BREAST BIOPSY     "? side"   [place]     COLONOSCOPY W/ BIOPSIES  01/08/2008   INSERTION OF DIALYSIS CATHETER Right 06/28/2016   Procedure: INSERTION OF DIALYSIS CATHETER,  right internal jugular;  Surgeon: Rosetta Posner, MD;  Location: [facility] OR;  Service: Vascular;  Laterality: Right;   MYOMECTOMY     PELVIC LAPAROSCOPY  2011   superficial thrombophlebitis Left 07-2014    Family History  Problem Relation Age of Onset   Hypertension Mother    Diabetes Mother    Heart disease Father    Hypertension Maternal Aunt    Diabetes  Maternal Grandmother    Heart disease Paternal Grandfather    Cerebral palsy Cousin        1ST COUSIN?   Diabetes Paternal Grandmother     Social History: Married--husband in good health. 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 She was using a cane "sparingly" after leg surgery. Church has been helping with meals for the past months. She reports that she has never smoked. She has never used smokeless tobacco. She reports that she does not drink alcohol and does not use drugs.   Allergies  Allergen Reactions   Other Anaphylaxis    Do not use polyflux membrane.  Use alternate   Savella [Milnacipran Hcl] Palpitations and Other (See Comments)    Unknown   Tape Rash and Other (See Comments)    Itch- unsure if it was paper or adhesive tape    Medications Prior to Admission  Medication Sig Dispense Refill   ambrisentan (LETAIRIS) 5 MG tablet Take 5 mg by mouth daily.      clonazePAM (KLONOPIN) 0.5 MG tablet Take 0.5 mg by mouth 2 (two) times daily as needed for anxiety.      hydrocortisone 2.5 % cream Apply 1 application topically 2 (two) times daily.     lidocaine-prilocaine (EMLA) cream Apply 1 application topically as needed.     linaclotide (LINZESS) 72 MCG capsule Take 72 mcg by mouth daily before breakfast.     pentoxifylline (TRENTAL) 400 MG CR tablet Take 400 mg by mouth 3 (three) times daily with meals.     Selexipag (UPTRAVI) 200 MCG TABS Take 1 tablet by mouth in the morning and at bedtime.      TADALAFIL PO Take 40 mg by mouth daily.      warfarin (COUMADIN) 5 MG tablet Take 7.5 mg by mouth daily.     acetaminophen (TYLENOL) 500 MG tablet Take 500-1,000 mg by mouth every 6 (six) hours as needed (for pain/headaches.).      calcium acetate (PHOSLO) 667 MG capsule Take 667 mg by mouth 5 (five) times daily.     cephALEXin (KEFLEX) 500 MG capsule Take 1 capsule (500 mg total) by mouth 2 (two) times daily. On dialysis days, take dose after dialysis. 28 capsule 0    cinacalcet (SENSIPAR) 30 MG tablet Take 30 mg by mouth at bedtime.      clotrimazole-betamethasone (LOTRISONE) cream Apply 1 application topically 2 (two) times daily. Apply to itchy area under toes twice a day 45 g 2   cyclobenzaprine (FLEXERIL) 5 MG tablet Take 1 tablet (5 mg total) by mouth 3 (three) times daily as needed for muscle spasms. (Patient taking differently: Take 10 mg by mouth 3 (three) times daily as needed for muscle spasms. ) 30 tablet 0   docusate sodium (COLACE) 100 MG capsule Take 2 capsules (200 mg total) by mouth 2 (two) times daily. (Patient taking differently: Take 200 mg by mouth 2 (two) times daily as needed (for constipation). ) 10 capsule 0   ferric citrate (AURYXIA) 1 GM 210 MG(Fe) tablet Take 420 mg by mouth 3 (  three) times daily with meals.      fluticasone (FLONASE) 50 MCG/ACT nasal spray Place 1 spray into the nose daily.     gabapentin (NEURONTIN) 100 MG capsule Take 100 mg by mouth daily as needed (pain).      gentamicin ointment (GARAMYCIN) 0.1 % Apply to toe after soaking twice daily 30 g 0   hydrOXYzine (ATARAX/VISTARIL) 25 MG tablet Take 25 mg by mouth every 8 (eight) hours as needed.     ipratropium (ATROVENT) 0.06 % nasal spray Place 2 sprays into both nostrils 4 (four) times daily. (Patient not taking: Reported on 08/27/2019)     lactulose (CHRONULAC) 10 GM/15ML solution SMARTSIG:15 Milliliter(s) By Mouth 3-4 Times Daily     levocetirizine (XYZAL) 5 MG tablet Take 5 mg by mouth every evening.     loperamide (IMODIUM) 2 MG capsule Take 2-4 mg by mouth 3 (three) times daily as needed for diarrhea or loose stools.     lubiprostone (AMITIZA) 8 MCG capsule Take 8 mcg by mouth in the morning and at bedtime.     metoprolol succinate (TOPROL-XL) 25 MG 24 hr tablet Take 12.5 mg by mouth daily.     metoprolol tartrate (LOPRESSOR) 25 MG tablet Take by mouth. (Patient not taking: Reported on 08/27/2019)     montelukast (SINGULAIR) 10 MG tablet Take 10 mg  by mouth daily as needed (allergy symptoms).  (Patient not taking: Reported on 08/27/2019)  1   multivitamin (RENA-VIT) TABS tablet Take 1 tablet by mouth daily.  (Patient not taking: Reported on 08/27/2019)     pantoprazole (PROTONIX) 40 MG tablet TAKE 1 TABLET(40 MG) BY MOUTH DAILY (Patient not taking: Reported on 08/27/2019) 90 tablet 1   phenylephrine (NEO-SYNEPHRINE) 0.5 % nasal solution Place 1 drop into both nostrils every 6 (six) hours as needed for congestion. 15 mL 1   sevelamer carbonate (RENVELA) 800 MG tablet Take by mouth.     Spacer/Aero-Holding Chambers DEVI Use spacer with albuterol 1 each 0   tadalafil, PAH, (ADCIRCA) 20 MG tablet Take 20 mg by mouth every other day.      Home: Home Living Family/patient expects to be discharged to:: Private residence Living Arrangements: Spouse/significant other Available Help at Discharge: Family Type of Home: House Home Access: Stairs to enter [employer] of Steps: 1 to enter garage, 2 at front (rails at front but pt reports she uses garage) Home Layout: Multi-level Alternate Level Stairs-Number of Steps: 12-15 Alternate Level Stairs-Rails: Left (wall on R) Bathroom Shower/Tub: Walk-in shower, Tub only Biochemist, clinical: Handicapped height Home Equipment: Civil engineer, contracting, Bedside commode, Cane - single point Additional Comments: spouse present to provide info.  pt has been sleeping on living room couch due to difficulty with stairs for 6-9 months.   Lives With: Spouse, Son  Functional History: Prior Function Level of Independence: Needs assistance Gait / Transfers Assistance Needed: assistance for stairs, use of cane intermittently ADL's / Homemaking Assistance Needed: pt stands to take shower, supervision for safety. assist for LB dressing to complete ADLs quickly, pt can do some independently. pt still driving short distances Comments: Pt reports she drives self to HD.  She uses 2L during day, 3L O2 at night (pt states she  leaves it on 2L: most days and nights) Functional Status:  Mobility: Bed Mobility Overal bed mobility: Needs Assistance Bed Mobility: Supine to Sit Supine to sit: Mod assist, +2 for safety/equipment General bed mobility comments: assist to move Rt LE off the bed, assist to roll  to Lt and assist to lift trunk  Transfers Overall transfer level: Needs assistance Equipment used: 2 person hand held assist Transfers: Sit to/from Stand, Stand Pivot Transfers Sit to Stand: Mod assist, +2 physical assistance, +2 safety/equipment Stand pivot transfers: Mod assist, +2 physical assistance, +2 safety/equipment General transfer comment: assist to boost hips from surface as well as facilitation for hip and knee extension as well as head/neck control and trunk extension. Rt knee buckled  Ambulation/Gait Ambulation/Gait assistance: Mod assist, +2 physical assistance Gait Distance (Feet): 3 Feet Assistive device: 1 person hand held assist Gait Pattern/deviations: Step-to pattern, Trunk flexed, Decreased stance time - right, Decreased dorsiflexion - right, Decreased weight shift to right General Gait Details: sig assist to maintain stability, block R knee, and facilitate wt shift to allow for steppingwith LLE.  Gait velocity: decreased Gait velocity interpretation: <1.31 ft/sec, indicative of household ambulator    ADL: ADL Overall ADL's : Needs assistance/impaired Eating/Feeding: Set up, Sitting, Bed level Grooming: Wash/dry hands, Wash/dry face, Oral care, Brushing hair, Set up, Sitting Upper Body Bathing: Moderate assistance, Sitting Lower Body Bathing: Maximal assistance, Sit to/from stand Upper Body Dressing : Maximal assistance, Sitting Lower Body Dressing: Maximal assistance, Sit to/from stand Lower Body Dressing Details (indicate cue type and reason): able to bend forward to with mod A for balance, and mod A to pull socks on  Toilet Transfer: Moderate assistance, +2 for physical assistance,  +2 for safety/equipment, Stand-pivot, BSC Toilet Transfer Details (indicate cue type and reason): Rt knee blocked, faciltiation at hips for extension, and to assist with balance and pivoting  Toileting- Clothing Manipulation and Hygiene: Total assistance, Sit to/from stand Functional mobility during ADLs: Moderate assistance, +2 for physical assistance, +2 for safety/equipment  Cognition: Cognition Overall Cognitive Status: Impaired/Different from baseline Arousal/Alertness: Awake/alert Orientation Level: Oriented X4 Attention: Focused, Sustained Focused Attention: Appears intact Sustained Attention: Appears intact Memory: Appears intact (Immediate: 5/5; delayed: 5/5; paragraph: 6/8) Awareness: Appears intact Problem Solving: Appears intact Executive Function: Reasoning, Sequencing, Organizing Reasoning: Appears intact Sequencing: Impaired Sequencing Impairment: Verbal complex (clock drawing: 2/4) Organizing: Impaired Organizing Impairment: Verbal complex (Backward digit span: 1/2) Safety/Judgment: Appears intact Cognition Arousal/Alertness: Awake/alert Behavior During Therapy: WFL for tasks assessed/performed Overall Cognitive Status: Impaired/Different from baseline Area of Impairment: Attention, Memory, Following commands, Safety/judgement, Awareness, Problem solving Current Attention Level: Selective Memory: Decreased short-term memory Following Commands: Follows one step commands consistently, Follows multi-step commands with increased time Safety/Judgement: Decreased awareness of deficits, Decreased awareness of safety Awareness: Intellectual Problem Solving: Difficulty sequencing, Requires verbal cues, Requires tactile cues General Comments: Pt requires cues for accuracy of PLOF.   She self distracts and requires cues to attend to task.      Blood pressure 128/65, pulse 77, temperature 98.2 F (36.8 C), temperature source Oral, resp. rate (!) 27, height 5\' 5"  (1.651 m),  weight 57.4 kg, last menstrual period 11/15/2008, SpO2 95 %. Physical Exam  General: Alert and oriented x 3, No apparent distress HEENT: Head is normocephalic, atraumatic, PERRLA, EOMI, sclera anicteric, oral mucosa pink and moist, dentition intact, ext ear canals clear,  Neck: Supple without JVD or lymphadenopathy Heart: Reg rate and rhythm. No murmurs rubs or gallops Chest: On 2L oxygen per [place].   Abdomen: Soft, non-tender, non-distended, bowel sounds positive. Extremities: No clubbing, cyanosis, or edema. Pulses are 2+ Skin: Clean and intact without signs of breakdown Neuro: Pt is cognitively appropriate with normal insight, memory, and awareness. Speech clear. Dense right hemiplegia. 0/5 in right arm and leg. Left  sided strength is intact.  Psych: Pt's affect is appropriate- tearful due to severity of condition but pleasant and hopeful. Pt is cooperative  Results for orders placed or performed during the hospital encounter of 12/12/19 (from the past 24 hour(s))  CBG monitoring, ED     Status: None   Collection Time: 12/12/19  3:25 PM  Result Value Ref Range   Glucose-Capillary 75 70 - 99 mg/dL   Comment 1 Notify RN    Comment 2 Document in Chart   Protime-INR     Status: Abnormal   Collection Time: 12/12/19  3:28 PM  Result Value Ref Range   Prothrombin Time 19.0 (H) 11.4 - 15.2 seconds   INR 1.7 (H) 0.8 - 1.2  APTT     Status: Abnormal   Collection Time: 12/12/19  3:28 PM  Result Value Ref Range   aPTT 38 (H) 24 - 36 seconds  CBC     Status: Abnormal   Collection Time: 12/12/19  3:28 PM  Result Value Ref Range   WBC 3.3 (L) 4.0 - 10.5 K/uL   RBC 3.65 (L) 3.87 - 5.11 MIL/uL   Hemoglobin 11.1 (L) 12.0 - 15.0 g/dL   HCT 34.6 (L) 36 - 46 %   MCV 94.8 80.0 - 100.0 fL   MCH 30.4 26.0 - 34.0 pg   MCHC 32.1 30.0 - 36.0 g/dL   RDW 17.9 (H) 11.5 - 15.5 %   Platelets 131 (L) 150 - 400 K/uL   nRBC 0.0 0.0 - 0.2 %  Differential     Status: None   Collection Time: 12/12/19  3:28 PM   Result Value Ref Range   Neutrophils Relative % 55 %   Neutro Abs 1.8 1.7 - 7.7 K/uL   Lymphocytes Relative 23 %   Lymphs Abs 0.8 0.7 - 4.0 K/uL   Monocytes Relative 18 %   Monocytes Absolute 0.6 0.1 - 1.0 K/uL   Eosinophils Relative 2 %   Eosinophils Absolute 0.1 0.0 - 0.5 K/uL   Basophils Relative 2 %   Basophils Absolute 0.1 0.0 - 0.1 K/uL   Immature Granulocytes 0 %   Abs Immature Granulocytes 0.01 0.00 - 0.07 K/uL  Comprehensive metabolic panel     Status: Abnormal   Collection Time: 12/12/19  3:28 PM  Result Value Ref Range   Sodium 140 135 - 145 mmol/L   Potassium 4.7 3.5 - 5.1 mmol/L   Chloride 99 98 - 111 mmol/L   CO2 27 22 - 32 mmol/L   Glucose, Bld 91 70 - 99 mg/dL   BUN 42 (H) 8 - 23 mg/dL   Creatinine, Ser 6.18 (H) 0.44 - 1.00 mg/dL   Calcium 8.8 (L) 8.9 - 10.3 mg/dL   Total Protein 7.2 6.5 - 8.1 g/dL   Albumin 2.9 (L) 3.5 - 5.0 g/dL   AST 35 15 - 41 U/L   ALT 25 0 - 44 U/L   Alkaline Phosphatase 152 (H) 38 - 126 U/L   Total Bilirubin 0.6 0.3 - 1.2 mg/dL   GFR, Estimated 7 (L) >60 mL/min   Anion gap 14 5 - 15  HIV Antibody (routine testing w rflx)     Status: None   Collection Time: 12/12/19  8:42 PM  Result Value Ref Range   HIV Screen 4th Generation wRfx Non Reactive Non Reactive  Hemoglobin A1c     Status: None   Collection Time: 12/12/19  8:42 PM  Result Value Ref Range   Hgb  A1c MFr Bld 5.2 4.8 - 5.6 %   Mean Plasma Glucose 102.54 mg/dL  Lipid panel     Status: None   Collection Time: 12/12/19  8:43 PM  Result Value Ref Range   Cholesterol 194 0 - 200 mg/dL   Triglycerides 48 <150 mg/dL   HDL 105 >40 mg/dL   Total CHOL/HDL Ratio 1.8 RATIO   VLDL 10 0 - 40 mg/dL   LDL Cholesterol 79 0 - 99 mg/dL  MRSA PCR Screening     Status: None   Collection Time: 12/12/19  9:15 PM   Specimen: Nasal Mucosa; Nasopharyngeal  Result Value Ref Range   MRSA by PCR NEGATIVE NEGATIVE  Resp Panel by RT-PCR (Flu A&B, Covid) Nasopharyngeal Swab     Status: None    Collection Time: 12/12/19 11:39 PM   Specimen: Nasopharyngeal Swab; Nasopharyngeal(NP) swabs in vial transport medium  Result Value Ref Range   SARS Coronavirus 2 by RT PCR NEGATIVE NEGATIVE   Influenza A by PCR NEGATIVE NEGATIVE   Influenza B by PCR NEGATIVE NEGATIVE  Protime-INR     Status: None   Collection Time: 12/13/19 12:06 AM  Result Value Ref Range   Prothrombin Time 15.1 11.4 - 15.2 seconds   INR 1.2 0.8 - 1.2  Glucose, capillary     Status: None   Collection Time: 12/13/19  1:12 AM  Result Value Ref Range   Glucose-Capillary 74 70 - 99 mg/dL  Glucose, capillary     Status: Abnormal   Collection Time: 12/13/19  4:01 AM  Result Value Ref Range   Glucose-Capillary 57 (L) 70 - 99 mg/dL  Glucose, capillary     Status: None   Collection Time: 12/13/19  5:06 AM  Result Value Ref Range   Glucose-Capillary 93 70 - 99 mg/dL  Glucose, capillary     Status: Abnormal   Collection Time: 12/13/19  8:06 AM  Result Value Ref Range   Glucose-Capillary 69 (L) 70 - 99 mg/dL  CBC     Status: Abnormal   Collection Time: 12/13/19  8:28 AM  Result Value Ref Range   WBC 3.8 (L) 4.0 - 10.5 K/uL   RBC 3.88 3.87 - 5.11 MIL/uL   Hemoglobin 11.5 (L) 12.0 - 15.0 g/dL   HCT 36.7 36 - 46 %   MCV 94.6 80.0 - 100.0 fL   MCH 29.6 26.0 - 34.0 pg   MCHC 31.3 30.0 - 36.0 g/dL   RDW 18.4 (H) 11.5 - 15.5 %   Platelets 131 (L) 150 - 400 K/uL   nRBC 0.0 0.0 - 0.2 %  Glucose, capillary     Status: None   Collection Time: 12/13/19  9:51 AM  Result Value Ref Range   Glucose-Capillary 73 70 - 99 mg/dL  Renal function panel     Status: Abnormal   Collection Time: 12/13/19 10:18 AM  Result Value Ref Range   Sodium 141 135 - 145 mmol/L   Potassium 5.1 3.5 - 5.1 mmol/L   Chloride 100 98 - 111 mmol/L   CO2 24 22 - 32 mmol/L   Glucose, Bld 78 70 - 99 mg/dL   BUN 51 (H) 8 - 23 mg/dL   Creatinine, Ser 7.48 (H) 0.44 - 1.00 mg/dL   Calcium 8.9 8.9 - 10.3 mg/dL   Phosphorus 7.7 (H) 2.5 - 4.6 mg/dL   Albumin  2.8 (L) 3.5 - 5.0 g/dL   GFR, Estimated 6 (L) >60 mL/min   Anion gap 17 (H) 5 -  15  Glucose, capillary     Status: Abnormal   Collection Time: 12/13/19 11:38 AM  Result Value Ref Range   Glucose-Capillary 110 (H) 70 - 99 mg/dL   DG Skull 1-3 Views  Result Date: 12/12/2019 CLINICAL DATA:  Encounter for foreign body in soft tissue. Left arm restricted for dialysis. Went to bed at midnight, woke up at 1 pm today. Flaccid right arm and leg. No vision changes. No facial droop. No withdrawal from on right leg and arm but can feel the pain. Pt does not make urine. No other complaints, headache, or dizziness. EXAM: SKULL - 1-3 VIEW COMPARISON:  None. FINDINGS: No skull fracture or lesion. No radiopaque foreign body.  Soft tissues are unremarkable. IMPRESSION: Negative. Electronically Signed   By: Lajean Manes M.D.   On: 12/12/2019 16:59   CT HEAD WO CONTRAST  Result Date: 12/13/2019 CLINICAL DATA:  Right-sided weakness EXAM: CT HEAD WITHOUT CONTRAST TECHNIQUE: Contiguous axial images were obtained from the base of the skull through the vertex without intravenous contrast. COMPARISON:  12/12/2019 at 6:21 p.m. FINDINGS: Brain: Unchanged size of intraparenchymal hematoma centered in the left frontal lobe measuring 1.7 x 3.1 cm. Surrounding edema is unchanged. The small adjacent satellite hemorrhage is also unchanged. No new site of hemorrhage. No midline shift or other mass effect. Vascular: No hyperdense vessel or unexpected calcification. Skull: Normal. Negative for fracture or focal lesion. Sinuses/Orbits: No acute finding. Other: None. IMPRESSION: Unchanged size of intraparenchymal hematoma centered in the left frontal lobe with surrounding edema. Electronically Signed   By: Ulyses Jarred M.D.   On: 12/13/2019 03:42   CT HEAD WO CONTRAST  Result Date: 12/12/2019 CLINICAL DATA:  Extremity weakness, stroke, intracranial hemorrhage EXAM: CT HEAD WITHOUT CONTRAST TECHNIQUE: Contiguous axial images were  obtained from the base of the skull through the vertex without intravenous contrast. COMPARISON:  12/12/2019 at 5:05 p.m. FINDINGS: Brain: The left frontal intraparenchymal hemorrhage seen previously is again identified, measuring 3.1 x 1.7 by 2.9 cm, not significantly changed since prior study. Smaller adjacent 0.5 cm focus of hemorrhage is also unchanged. Mild surrounding edema with regional mass effect is stable. No midline shift. Lateral ventricles and midline structures are unremarkable. Vascular: Stable atherosclerosis.  No hyperdense vessel. Skull: Normal. Negative for fracture or focal lesion. Sinuses/Orbits: Stable right maxillary sinus mucosal thickening. Other: Stable calcifications within the bilateral parotid glands. IMPRESSION: 1. Stable left frontal intraparenchymal hemorrhage and mild surrounding edema. Electronically Signed   By: Randa Ngo M.D.   On: 12/12/2019 18:51   CT HEAD WO CONTRAST  Result Date: 12/12/2019 CLINICAL DATA:  Right-sided weakness EXAM: CT HEAD WITHOUT CONTRAST TECHNIQUE: Contiguous axial images were obtained from the base of the skull through the vertex without intravenous contrast. COMPARISON:  None. FINDINGS: Brain: There is acute parenchymal hemorrhage within the posterior left frontal lobe involving the superior frontal and precentral gyri. This measures approximately 2.9 x 1.7 x 2.9 cm. Adjacent subcentimeter focus of hemorrhage. Mild surrounding edema with minor regional mass effect. Gray-white differentiation is preserved. Ventricles and sulci are within normal limits in size and configuration. Vascular: There is atherosclerotic calcification at the skull base. Skull: Calvarium is unremarkable. Sinuses/Orbits: No acute finding. Other: Mastoid air cells are clear. Multiple punctate bilateral parotid calculi. IMPRESSION: Acute left frontal parenchymal hemorrhage with mild edema. No significant mass effect. Critical Value/emergent results were called by telephone at  the time of interpretation on 12/12/2019 at 5:17 pm to provider [facility] , who verbally acknowledged these  results. Electronically Signed   By: Macy Mis M.D.   On: 12/12/2019 17:22   MR ANGIO HEAD WO CONTRAST  Result Date: 12/13/2019 CLINICAL DATA:  Thrombocytopenia. EXAM: MRI HEAD WITHOUT CONTRAST MRA HEAD WITHOUT CONTRAST TECHNIQUE: Multiplanar, multiecho pulse sequences of the brain and surrounding structures were obtained without intravenous contrast. Angiographic images of the head were obtained using MRA technique without contrast. COMPARISON:  Head CT from earlier today and yesterday FINDINGS: MRI HEAD FINDINGS Brain: T1 and T2 hyperintense hematoma in the high subcortical posterior left frontal lobe which measures 3 x 1.8 cm on axial slices. Subcentimeter adjacent hemorrhage is present within a single rim of vasogenic edema. No underlying infarct is seen. No gross masslike finding detected. There are other remote microhemorrhages with nodular hemosiderin staining seen in the parasagittal right frontal lobe, bilateral anterior frontal white matter, and along the high and lateral left frontal lobe. No generalized chronic white matter disease. No hydrocephalus or extra-axial collection. Vascular: Arterial findings below. Normal dural venous sinus flow voids. Skull and upper cervical spine: Hypointense cervical marrow with mild clivus heterogeneity, likely red marrow reconversion in this dialysis patient. Sinuses/Orbits: Proteinaceous retention cysts in the right maxillary sinus. Other: Nodular appearance of the bilateral parotid glands with a 9 mm T1 hyperintense mass in the superficial left parotid. Small T2 hyperintense cysts in the right parotid. There is known history of scleroderma. MRA HEAD FINDINGS Vessels are smooth and widely patent.  No aneurysm or stenosis. IMPRESSION: Brain MRI: Known acute hemorrhage in the subcortical left frontal white matter with rim of edema. A few remote  hemorrhages have also occurred in the bilateral upper cerebrum suggesting underlying vasculopathy or bleeding diathesis. Notable lack of chronic white matter disease. Intracranial MRA: Negative. Electronically Signed   By: Monte Fantasia M.D.   On: 12/13/2019 07:57   MR BRAIN WO CONTRAST  Result Date: 12/13/2019 CLINICAL DATA:  Thrombocytopenia. EXAM: MRI HEAD WITHOUT CONTRAST MRA HEAD WITHOUT CONTRAST TECHNIQUE: Multiplanar, multiecho pulse sequences of the brain and surrounding structures were obtained without intravenous contrast. Angiographic images of the head were obtained using MRA technique without contrast. COMPARISON:  Head CT from earlier today and yesterday FINDINGS: MRI HEAD FINDINGS Brain: T1 and T2 hyperintense hematoma in the high subcortical posterior left frontal lobe which measures 3 x 1.8 cm on axial slices. Subcentimeter adjacent hemorrhage is present within a single rim of vasogenic edema. No underlying infarct is seen. No gross masslike finding detected. There are other remote microhemorrhages with nodular hemosiderin staining seen in the parasagittal right frontal lobe, bilateral anterior frontal white matter, and along the high and lateral left frontal lobe. No generalized chronic white matter disease. No hydrocephalus or extra-axial collection. Vascular: Arterial findings below. Normal dural venous sinus flow voids. Skull and upper cervical spine: Hypointense cervical marrow with mild clivus heterogeneity, likely red marrow reconversion in this dialysis patient. Sinuses/Orbits: Proteinaceous retention cysts in the right maxillary sinus. Other: Nodular appearance of the bilateral parotid glands with a 9 mm T1 hyperintense mass in the superficial left parotid. Small T2 hyperintense cysts in the right parotid. There is known history of scleroderma. MRA HEAD FINDINGS Vessels are smooth and widely patent.  No aneurysm or stenosis. IMPRESSION: Brain MRI: Known acute hemorrhage in the  subcortical left frontal white matter with rim of edema. A few remote hemorrhages have also occurred in the bilateral upper cerebrum suggesting underlying vasculopathy or bleeding diathesis. Notable lack of chronic white matter disease. Intracranial MRA: Negative. Electronically Signed  By: Monte Fantasia M.D.   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 Does the need for close, 24 hr/day medical supervision in concert with the patient's rehab needs make it unreasonable for this patient to be served in a less intensive setting? Yes 2. Co-Morbidities requiring supervision/potential complications: atrial fibrillation, HLD, ESRD, HTN, OSA 3. Due to bladder management, bowel management, safety, skin/wound care, disease management, medication administration, pain management and patient education, does the patient require 24 hr/day rehab nursing? Yes 4. Does the patient require coordinated care of a physician, rehab nurse, therapy disciplines of PT, OT to address physical and functional deficits in the context of the above medical diagnosis(es)? Yes Addressing deficits in the following areas: balance, endurance, locomotion, strength, transferring, bowel/bladder control, bathing, dressing, feeding, grooming, toileting and psychosocial support 5. Can the patient actively participate in an intensive therapy program of at least 3 hrs of therapy per day at least 5 days per week? Yes 6. The potential for patient to make measurable gains while on inpatient rehab is excellent 7. Anticipated functional outcomes upon discharge from inpatient rehab are min assist  with PT, min assist with OT, independent with SLP. 8. Estimated rehab length of stay to reach the above functional goals is: 18- 20 days 9. Anticipated discharge destination: Home 10. Overall Rehab/Functional Prognosis: excellent  RECOMMENDATIONS: This patient's condition is appropriate for  continued rehabilitative care in the following setting: CIR Patient has agreed to participate in recommended program. Yes Note that insurance prior authorization may be required for reimbursement for recommended care.  Comment: Leeroy Cha, MD Bary Leriche, PA-C 12/13/2019

## 2019-12-13 NOTE — Progress Notes (Signed)
[facility] Progress Note   Subjective: Seen in room, husband at bedside. Still unable to move RUE and RLL extremity but does have sensation in both extremities.    No C/Os.  Objective Vitals:   12/13/19 0500 12/13/19 0600 12/13/19 0800 12/13/19 0900  BP: 124/72 132/78 (!) 146/80 (!) 144/76  Pulse: 73 76 76 76  Resp: (!) 24 (!) 25 (!) 26 20  Temp:   97.6 F (36.4 C)   TempSrc:   Oral   SpO2: 98% 97% 100% 99%  Weight:      Height:       Physical Exam General: Ill appearing older female in NAD Heart: S1,S2 SR on monitor. No M/R/G Lungs: CTAB A/T Abdomen: active BS Extremities: trace BLE edema.  Dialysis Access: L AVF + bruit.   Additional Objective Labs: Basic Metabolic Panel: Recent Labs  Lab 12/12/19 1512 12/12/19 1528  NA 141 140  K 4.4 4.7  CL 102 99  CO2  --  27  GLUCOSE 85 91  BUN 47* 42*  CREATININE 6.30* 6.18*  CALCIUM  --  8.8*   Liver Function Tests: Recent Labs  Lab 12/12/19 1528  AST 35  ALT 25  ALKPHOS 152*  BILITOT 0.6  PROT 7.2  ALBUMIN 2.9*   No results for input(s): LIPASE, AMYLASE in the last 168 hours. CBC: Recent Labs  Lab 12/12/19 1512 12/12/19 1528 12/13/19 0828  WBC  --  3.3* 3.8*  NEUTROABS  --  1.8  --   HGB 12.2 11.1* 11.5*  HCT 36.0 34.6* 36.7  MCV  --  94.8 94.6  PLT  --  131* 131*   Blood Culture No results found for: SDES, SPECREQUEST, CULT, REPTSTATUS  Cardiac Enzymes: No results for input(s): CKTOTAL, CKMB, CKMBINDEX, TROPONINI in the last 168 hours. CBG: Recent Labs  Lab 12/13/19 0112 12/13/19 0401 12/13/19 0506 12/13/19 0806 12/13/19 0951  GLUCAP 74 57* 93 69* 73   Iron Studies: No results for input(s): IRON, TIBC, TRANSFERRIN, FERRITIN in the last 72 hours. @lablastinr3 @ Studies/Results: DG Skull 1-3 Views  Result Date: 12/12/2019 CLINICAL DATA:  Encounter for foreign body in soft tissue. Left arm restricted for dialysis. Went to bed at midnight, woke up at 1 pm today. Flaccid  right arm and leg. No vision changes. No facial droop. No withdrawal from on right leg and arm but can feel the pain. Pt does not make urine. No other complaints, headache, or dizziness. EXAM: SKULL - 1-3 VIEW COMPARISON:  None. FINDINGS: No skull fracture or lesion. No radiopaque foreign body.  Soft tissues are unremarkable. IMPRESSION: Negative. Electronically Signed   By: Lajean Manes M.D.   On: 12/12/2019 16:59   CT HEAD WO CONTRAST  Result Date: 12/13/2019 CLINICAL DATA:  Right-sided weakness EXAM: CT HEAD WITHOUT CONTRAST TECHNIQUE: Contiguous axial images were obtained from the base of the skull through the vertex without intravenous contrast. COMPARISON:  12/12/2019 at 6:21 p.m. FINDINGS: Brain: Unchanged size of intraparenchymal hematoma centered in the left frontal lobe measuring 1.7 x 3.1 cm. Surrounding edema is unchanged. The small adjacent satellite hemorrhage is also unchanged. No new site of hemorrhage. No midline shift or other mass effect. Vascular: No hyperdense vessel or unexpected calcification. Skull: Normal. Negative for fracture or focal lesion. Sinuses/Orbits: No acute finding. Other: None. IMPRESSION: Unchanged size of intraparenchymal hematoma centered in the left frontal lobe with surrounding edema. Electronically Signed   By: Ulyses Jarred M.D.   On: 12/13/2019 03:42   CT  HEAD WO CONTRAST  Result Date: 12/12/2019 CLINICAL DATA:  Extremity weakness, stroke, intracranial hemorrhage EXAM: CT HEAD WITHOUT CONTRAST TECHNIQUE: Contiguous axial images were obtained from the base of the skull through the vertex without intravenous contrast. COMPARISON:  12/12/2019 at 5:05 p.m. FINDINGS: Brain: The left frontal intraparenchymal hemorrhage seen previously is again identified, measuring 3.1 x 1.7 by 2.9 cm, not significantly changed since prior study. Smaller adjacent 0.5 cm focus of hemorrhage is also unchanged. Mild surrounding edema with regional mass effect is stable. No midline shift.  Lateral ventricles and midline structures are unremarkable. Vascular: Stable atherosclerosis.  No hyperdense vessel. Skull: Normal. Negative for fracture or focal lesion. Sinuses/Orbits: Stable right maxillary sinus mucosal thickening. Other: Stable calcifications within the bilateral parotid glands. IMPRESSION: 1. Stable left frontal intraparenchymal hemorrhage and mild surrounding edema. Electronically Signed   By: Randa Ngo M.D.   On: 12/12/2019 18:51   CT HEAD WO CONTRAST  Result Date: 12/12/2019 CLINICAL DATA:  Right-sided weakness EXAM: CT HEAD WITHOUT CONTRAST TECHNIQUE: Contiguous axial images were obtained from the base of the skull through the vertex without intravenous contrast. COMPARISON:  None. FINDINGS: Brain: There is acute parenchymal hemorrhage within the posterior left frontal lobe involving the superior frontal and precentral gyri. This measures approximately 2.9 x 1.7 x 2.9 cm. Adjacent subcentimeter focus of hemorrhage. Mild surrounding edema with minor regional mass effect. Gray-white differentiation is preserved. Ventricles and sulci are within normal limits in size and configuration. Vascular: There is atherosclerotic calcification at the skull base. Skull: Calvarium is unremarkable. Sinuses/Orbits: No acute finding. Other: Mastoid air cells are clear. Multiple punctate bilateral parotid calculi. IMPRESSION: Acute left frontal parenchymal hemorrhage with mild edema. No significant mass effect. Critical Value/emergent results were called by telephone at the time of interpretation on 12/12/2019 at 5:17 pm to provider [facility] , who verbally acknowledged these results. Electronically Signed   By: Macy Mis M.D.   On: 12/12/2019 17:22   MR ANGIO HEAD WO CONTRAST  Result Date: 12/13/2019 CLINICAL DATA:  Thrombocytopenia. EXAM: MRI HEAD WITHOUT CONTRAST MRA HEAD WITHOUT CONTRAST TECHNIQUE: Multiplanar, multiecho pulse sequences of the brain and surrounding structures were  obtained without intravenous contrast. Angiographic images of the head were obtained using MRA technique without contrast. COMPARISON:  Head CT from earlier today and yesterday FINDINGS: MRI HEAD FINDINGS Brain: T1 and T2 hyperintense hematoma in the high subcortical posterior left frontal lobe which measures 3 x 1.8 cm on axial slices. Subcentimeter adjacent hemorrhage is present within a single rim of vasogenic edema. No underlying infarct is seen. No gross masslike finding detected. There are other remote microhemorrhages with nodular hemosiderin staining seen in the parasagittal right frontal lobe, bilateral anterior frontal white matter, and along the high and lateral left frontal lobe. No generalized chronic white matter disease. No hydrocephalus or extra-axial collection. Vascular: Arterial findings below. Normal dural venous sinus flow voids. Skull and upper cervical spine: Hypointense cervical marrow with mild clivus heterogeneity, likely red marrow reconversion in this dialysis patient. Sinuses/Orbits: Proteinaceous retention cysts in the right maxillary sinus. Other: Nodular appearance of the bilateral parotid glands with a 9 mm T1 hyperintense mass in the superficial left parotid. Small T2 hyperintense cysts in the right parotid. There is known history of scleroderma. MRA HEAD FINDINGS Vessels are smooth and widely patent.  No aneurysm or stenosis. IMPRESSION: Brain MRI: Known acute hemorrhage in the subcortical left frontal white matter with rim of edema. A few remote hemorrhages have also occurred in the bilateral  upper cerebrum suggesting underlying vasculopathy or bleeding diathesis. Notable lack of chronic white matter disease. Intracranial MRA: Negative. Electronically Signed   By: Monte Fantasia M.D.   On: 12/13/2019 07:57   MR BRAIN WO CONTRAST  Result Date: 12/13/2019 CLINICAL DATA:  Thrombocytopenia. EXAM: MRI HEAD WITHOUT CONTRAST MRA HEAD WITHOUT CONTRAST TECHNIQUE: Multiplanar,  multiecho pulse sequences of the brain and surrounding structures were obtained without intravenous contrast. Angiographic images of the head were obtained using MRA technique without contrast. COMPARISON:  Head CT from earlier today and yesterday FINDINGS: MRI HEAD FINDINGS Brain: T1 and T2 hyperintense hematoma in the high subcortical posterior left frontal lobe which measures 3 x 1.8 cm on axial slices. Subcentimeter adjacent hemorrhage is present within a single rim of vasogenic edema. No underlying infarct is seen. No gross masslike finding detected. There are other remote microhemorrhages with nodular hemosiderin staining seen in the parasagittal right frontal lobe, bilateral anterior frontal white matter, and along the high and lateral left frontal lobe. No generalized chronic white matter disease. No hydrocephalus or extra-axial collection. Vascular: Arterial findings below. Normal dural venous sinus flow voids. Skull and upper cervical spine: Hypointense cervical marrow with mild clivus heterogeneity, likely red marrow reconversion in this dialysis patient. Sinuses/Orbits: Proteinaceous retention cysts in the right maxillary sinus. Other: Nodular appearance of the bilateral parotid glands with a 9 mm T1 hyperintense mass in the superficial left parotid. Small T2 hyperintense cysts in the right parotid. There is known history of scleroderma. MRA HEAD FINDINGS Vessels are smooth and widely patent.  No aneurysm or stenosis. IMPRESSION: Brain MRI: Known acute hemorrhage in the subcortical left frontal white matter with rim of edema. A few remote hemorrhages have also occurred in the bilateral upper cerebrum suggesting underlying vasculopathy or bleeding diathesis. Notable lack of chronic white matter disease. Intracranial MRA: Negative. Electronically Signed   By: Monte Fantasia M.D.   On: 12/13/2019 07:57   Medications: . sodium chloride    . sodium chloride    . clevidipine     .  stroke: mapping our  early stages of recovery book   Does not apply Once  . Chlorhexidine Gluconate Cloth  6 each Topical Daily  . Chlorhexidine Gluconate Cloth  6 each Topical Q0600  . cinacalcet  30 mg Oral Q T,Th,Sa-HD  . doxercalciferol  6 mcg Intravenous Q T,Th,Sa-HD  . ferric citrate  420 mg Oral TID WC  . labetalol  20 mg Intravenous Once  . pantoprazole (PROTONIX) IV  40 mg Intravenous QHS  . senna-docusate  1 tablet Oral BID   HD orders: [place] T, Th,S 3.5 hrs 450/600 2.0 K/2.25 Ca 58.5 kg UFP 2 linear Na -No Heparin -Mircera 50 mcg IV q 4 weeks -Hectorol 6 mcg IV TIW -Sensipar 30 mg PO TIW   Assessment/Plan: 64 year old black female with multiple medical issues including ESRD.  Presents with right hemiplegia secondary to [place] 1 ICH-thought secondary to fairly new Coumadin therapy.  Coumadin has been held, reversing agents on board, neurology following, to be admitted to ICU for neuro monitoring. Repeat CT of head today without evidence of extension of hematoma.  2 ESRD: We will continue with TTS schedule via aVF-will be due next today.  Minimal ultrafiltration to avoid blood pressure drop 3 Hypertension/Volume-No evidence of volume overload. Need to avoid hypotension during HD. Keep SBP > 110.   Currently on tadalafil and Uptravi for her pulmonary hypertension 4. Anemia of ESRD: Hgb 11.5. No ESA needed.  5. Metabolic Bone  Disease: We will continue her on her vitamin D, Sensipar and Auryxia/Renvela 6.  pulmonary HTN. Follows with cardiology at [facility]. Continue medications. Avoid hypotension.   Jamori Biggar H. Naliya Gish NP-C 12/13/2019, 10:50 AM  [employer] [phone]

## 2019-12-13 NOTE — Progress Notes (Signed)
STROKE TEAM PROGRESS NOTE   INTERVAL HISTORY Pt sitting in chair, later her son came in to the room.  Patient stated that she still has right arm leg weakness, however denies any speech difficulty, facial droop or vision changes.  She is taking Coumadin at home every day.  INR on admission 1.7.  Will have dialysis today, nephrology on board.  Repeat CT and MRI showed stable left frontal hemorrhage.  Vitals:   12/13/19 0800 12/13/19 0900 12/13/19 1000 12/13/19 1100  BP: (!) 146/80 (!) 144/76 (!) 152/86 (!) 144/84  Pulse: 76 76 (!) 112 78  Resp: (!) 26 20 20  (!) 27  Temp: 97.6 F (36.4 C)     TempSrc: Oral     SpO2: 100% 99% 94% 100%  Weight:      Height:       CBC:  Recent Labs  Lab 12/12/19 1528 12/13/19 0828  WBC 3.3* 3.8*  NEUTROABS 1.8  --   HGB 11.1* 11.5*  HCT 34.6* 36.7  MCV 94.8 94.6  PLT 131* 564*   Basic Metabolic Panel:  Recent Labs  Lab 12/12/19 1528 12/13/19 1018  NA 140 141  K 4.7 5.1  CL 99 100  CO2 27 24  GLUCOSE 91 78  BUN 42* 51*  CREATININE 6.18* 7.48*  CALCIUM 8.8* 8.9  PHOS  --  7.7*   Lipid Panel:  Recent Labs  Lab 12/12/19 2043  CHOL 194  TRIG 48  HDL 105  CHOLHDL 1.8  VLDL 10  LDLCALC 79   HgbA1c:  Recent Labs  Lab 12/12/19 2042  HGBA1C 5.2   Urine Drug Screen: No results for input(s): LABOPIA, COCAINSCRNUR, LABBENZ, AMPHETMU, THCU, LABBARB in the last 168 hours.  Alcohol Level  Recent Labs  Lab 12/12/19 1448  ETH <10    IMAGING past 24 hours DG Skull 1-3 Views  Result Date: 12/12/2019 CLINICAL DATA:  Encounter for foreign body in soft tissue. Left arm restricted for dialysis. Went to bed at midnight, woke up at 1 pm today. Flaccid right arm and leg. No vision changes. No facial droop. No withdrawal from on right leg and arm but can feel the pain. Pt does not make urine. No other complaints, headache, or dizziness. EXAM: SKULL - 1-3 VIEW COMPARISON:  None. FINDINGS: No skull fracture or lesion. No radiopaque foreign body.   Soft tissues are unremarkable. IMPRESSION: Negative. Electronically Signed   By: Lajean Manes M.D.   On: 12/12/2019 16:59   CT HEAD WO CONTRAST  Result Date: 12/13/2019 CLINICAL DATA:  Right-sided weakness EXAM: CT HEAD WITHOUT CONTRAST TECHNIQUE: Contiguous axial images were obtained from the base of the skull through the vertex without intravenous contrast. COMPARISON:  12/12/2019 at 6:21 p.m. FINDINGS: Brain: Unchanged size of intraparenchymal hematoma centered in the left frontal lobe measuring 1.7 x 3.1 cm. Surrounding edema is unchanged. The small adjacent satellite hemorrhage is also unchanged. No new site of hemorrhage. No midline shift or other mass effect. Vascular: No hyperdense vessel or unexpected calcification. Skull: Normal. Negative for fracture or focal lesion. Sinuses/Orbits: No acute finding. Other: None. IMPRESSION: Unchanged size of intraparenchymal hematoma centered in the left frontal lobe with surrounding edema. Electronically Signed   By: Ulyses Jarred M.D.   On: 12/13/2019 03:42   CT HEAD WO CONTRAST  Result Date: 12/12/2019 CLINICAL DATA:  Extremity weakness, stroke, intracranial hemorrhage EXAM: CT HEAD WITHOUT CONTRAST TECHNIQUE: Contiguous axial images were obtained from the base of the skull through the vertex without intravenous  contrast. COMPARISON:  12/12/2019 at 5:05 p.m. FINDINGS: Brain: The left frontal intraparenchymal hemorrhage seen previously is again identified, measuring 3.1 x 1.7 by 2.9 cm, not significantly changed since prior study. Smaller adjacent 0.5 cm focus of hemorrhage is also unchanged. Mild surrounding edema with regional mass effect is stable. No midline shift. Lateral ventricles and midline structures are unremarkable. Vascular: Stable atherosclerosis.  No hyperdense vessel. Skull: Normal. Negative for fracture or focal lesion. Sinuses/Orbits: Stable right maxillary sinus mucosal thickening. Other: Stable calcifications within the bilateral parotid  glands. IMPRESSION: 1. Stable left frontal intraparenchymal hemorrhage and mild surrounding edema. Electronically Signed   By: Randa Ngo M.D.   On: 12/12/2019 18:51   CT HEAD WO CONTRAST  Result Date: 12/12/2019 CLINICAL DATA:  Right-sided weakness EXAM: CT HEAD WITHOUT CONTRAST TECHNIQUE: Contiguous axial images were obtained from the base of the skull through the vertex without intravenous contrast. COMPARISON:  None. FINDINGS: Brain: There is acute parenchymal hemorrhage within the posterior left frontal lobe involving the superior frontal and precentral gyri. This measures approximately 2.9 x 1.7 x 2.9 cm. Adjacent subcentimeter focus of hemorrhage. Mild surrounding edema with minor regional mass effect. Gray-white differentiation is preserved. Ventricles and sulci are within normal limits in size and configuration. Vascular: There is atherosclerotic calcification at the skull base. Skull: Calvarium is unremarkable. Sinuses/Orbits: No acute finding. Other: Mastoid air cells are clear. Multiple punctate bilateral parotid calculi. IMPRESSION: Acute left frontal parenchymal hemorrhage with mild edema. No significant mass effect. Critical Value/emergent results were called by telephone at the time of interpretation on 12/12/2019 at 5:17 pm to provider [facility] , who verbally acknowledged these results. Electronically Signed   By: Macy Mis M.D.   On: 12/12/2019 17:22   MR ANGIO HEAD WO CONTRAST  Result Date: 12/13/2019 CLINICAL DATA:  Thrombocytopenia. EXAM: MRI HEAD WITHOUT CONTRAST MRA HEAD WITHOUT CONTRAST TECHNIQUE: Multiplanar, multiecho pulse sequences of the brain and surrounding structures were obtained without intravenous contrast. Angiographic images of the head were obtained using MRA technique without contrast. COMPARISON:  Head CT from earlier today and yesterday FINDINGS: MRI HEAD FINDINGS Brain: T1 and T2 hyperintense hematoma in the high subcortical posterior left frontal lobe  which measures 3 x 1.8 cm on axial slices. Subcentimeter adjacent hemorrhage is present within a single rim of vasogenic edema. No underlying infarct is seen. No gross masslike finding detected. There are other remote microhemorrhages with nodular hemosiderin staining seen in the parasagittal right frontal lobe, bilateral anterior frontal white matter, and along the high and lateral left frontal lobe. No generalized chronic white matter disease. No hydrocephalus or extra-axial collection. Vascular: Arterial findings below. Normal dural venous sinus flow voids. Skull and upper cervical spine: Hypointense cervical marrow with mild clivus heterogeneity, likely red marrow reconversion in this dialysis patient. Sinuses/Orbits: Proteinaceous retention cysts in the right maxillary sinus. Other: Nodular appearance of the bilateral parotid glands with a 9 mm T1 hyperintense mass in the superficial left parotid. Small T2 hyperintense cysts in the right parotid. There is known history of scleroderma. MRA HEAD FINDINGS Vessels are smooth and widely patent.  No aneurysm or stenosis. IMPRESSION: Brain MRI: Known acute hemorrhage in the subcortical left frontal white matter with rim of edema. A few remote hemorrhages have also occurred in the bilateral upper cerebrum suggesting underlying vasculopathy or bleeding diathesis. Notable lack of chronic white matter disease. Intracranial MRA: Negative. Electronically Signed   By: Monte Fantasia M.D.   On: 12/13/2019 07:57   MR BRAIN WO  CONTRAST  Result Date: 12/13/2019 CLINICAL DATA:  Thrombocytopenia. EXAM: MRI HEAD WITHOUT CONTRAST MRA HEAD WITHOUT CONTRAST TECHNIQUE: Multiplanar, multiecho pulse sequences of the brain and surrounding structures were obtained without intravenous contrast. Angiographic images of the head were obtained using MRA technique without contrast. COMPARISON:  Head CT from earlier today and yesterday FINDINGS: MRI HEAD FINDINGS Brain: T1 and T2  hyperintense hematoma in the high subcortical posterior left frontal lobe which measures 3 x 1.8 cm on axial slices. Subcentimeter adjacent hemorrhage is present within a single rim of vasogenic edema. No underlying infarct is seen. No gross masslike finding detected. There are other remote microhemorrhages with nodular hemosiderin staining seen in the parasagittal right frontal lobe, bilateral anterior frontal white matter, and along the high and lateral left frontal lobe. No generalized chronic white matter disease. No hydrocephalus or extra-axial collection. Vascular: Arterial findings below. Normal dural venous sinus flow voids. Skull and upper cervical spine: Hypointense cervical marrow with mild clivus heterogeneity, likely red marrow reconversion in this dialysis patient. Sinuses/Orbits: Proteinaceous retention cysts in the right maxillary sinus. Other: Nodular appearance of the bilateral parotid glands with a 9 mm T1 hyperintense mass in the superficial left parotid. Small T2 hyperintense cysts in the right parotid. There is known history of scleroderma. MRA HEAD FINDINGS Vessels are smooth and widely patent.  No aneurysm or stenosis. IMPRESSION: Brain MRI: Known acute hemorrhage in the subcortical left frontal white matter with rim of edema. A few remote hemorrhages have also occurred in the bilateral upper cerebrum suggesting underlying vasculopathy or bleeding diathesis. Notable lack of chronic white matter disease. Intracranial MRA: Negative. Electronically Signed   By: Monte Fantasia M.D.   On: 12/13/2019 07:57    PHYSICAL EXAM  Temp:  [97.5 F (36.4 C)-98.8 F (37.1 C)] 97.8 F (36.6 C) (12/02 1730) Pulse Rate:  [40-112] 78 (12/02 1800) Resp:  [13-31] 25 (12/02 1800) BP: (115-157)/(61-100) 135/67 (12/02 1800) SpO2:  [94 %-100 %] 99 % (12/02 1800) Weight:  [56 kg-57.4 kg] 56 kg (12/02 1730)  General - Well nourished, well developed, in no apparent distress.  Ophthalmologic - fundi not  visualized due to noncooperation.  Cardiovascular - irregularly irregular heart rate and rhythm.  Mental Status -  Level of arousal and orientation to time, place, and person were intact. Language including expression, naming, repetition, comprehension was assessed and found intact. Fund of Knowledge was assessed and was intact.  Cranial Nerves II - XII - II - Visual field intact OU. III, IV, VI - Extraocular movements intact. V - Facial sensation intact bilaterally. VII - Facial movement intact bilaterally. VIII - Hearing & vestibular intact bilaterally. X - Palate elevates symmetrically. XI - Chin turning & shoulder shrug intact bilaterally. XII - Tongue protrusion intact.  Motor Strength - The patient's strength was normal in LUE and LLE, however, right upper and lower extremity hemiplegia.  Bulk was normal and fasciculations were absent.   Motor Tone - Muscle tone was assessed at the neck and appendages and was normal.  Reflexes - The patient's reflexes were symmetrical in all extremities and she had no pathological reflexes.  Sensory - Light touch, temperature/pinprick were assessed and were symmetrical.    Coordination - The patient had normal movements in the left hand with no ataxia or dysmetria.  Tremor was absent.  Gait and Station - deferred.   ASSESSMENT/PLAN Katie Nunez is a 64 y.o. female with history of ESRD on HD TTS, fibromyalgia,hypertension,mixed connective tissue disorder, scleroderma, pulmonary  hypertension,atrial fibrillation, cirrhosis-on Coumadin forportal vein thrombosis and atrial fibrillation, presenting with R sided weakness.   Stroke:   L frontal IPH d/t warfarin coagulopathy   CT head acute L frontal IPH w/ mild edema  Repeat CT head stable L frontal IPH  Repeat CT head unchanged L frontal IPH  MRI  Subcortical L frontal white matter IPH w/ rim edema. few remote B hemorrhages upper cerebrum.  MRA  Unremarkable   2D Echo  pending  LDL 79  HgbA1c 5.2  VTE prophylaxis - SCDs   warfarin daily prior to admission, now on No antithrombotic given IPH  Therapy recommendations:  CIR. Consult placed.   Disposition:  pending   Atrial Fibrillation  Home anticoagulation:  warfarin daily   INR 1.7 on admission   Reversed w/ vit K, KCentra   Last INR 1.2 . May consider resume coumadin or consider eliquis once ICH resolves   Hypertension  Home meds:  No meds listed  SBP goal < 160 . Long-term BP goal normotensive  Hyperlipidemia  Home meds:  No statin  LDL 79, goal < 70  Hold statin consideration in setting of hemorrhage   Consider statin at discharge / follow-up  ESRD   On HD at TTS at [facility]  Nephrology on board  HD today  Other Stroke Risk Factors  Advanced Age >/= 83   Obstructive sleep apnea  Other Active Problems  Anemia of ESRD  Metabolic bone dz on Vit D, Sensipar and Auryxia/Renvela  Pulm HTN ([facility])  LLE wd s/p I7D several wks ago, healing. WOC saw. Dressing orders in place  RA  Scleroderma  Fibromyalgia  Hospital day # 1  This patient is critically ill due to frontal ICH, A. fib, ESRD on dialysis and at significant risk of neurological worsening, death form hematoma expansion, cerebral edema, heart failure, seizure. This patient's care requires constant monitoring of vital signs, hemodynamics, respiratory and cardiac monitoring, review of multiple databases, neurological assessment, discussion with family, other specialists and medical decision making of high complexity. I spent 40 minutes of neurocritical care time in the care of this patient.  Rosalin Hawking, MD PhD Stroke Neurology 12/13/2019 8:04 PM   To contact Stroke Continuity provider, please refer to [email or web address] After hours, contact General Neurology

## 2019-12-14 ENCOUNTER — Inpatient Hospital Stay (HOSPITAL_COMMUNITY): Payer: Medicare Other

## 2019-12-14 DIAGNOSIS — I361 Nonrheumatic tricuspid (valve) insufficiency: Secondary | ICD-10-CM

## 2019-12-14 DIAGNOSIS — I6389 Other cerebral infarction: Secondary | ICD-10-CM

## 2019-12-14 LAB — ECHOCARDIOGRAM COMPLETE
AR max vel: 1.72 cm2
AV Area VTI: 1.8 cm2
AV Area mean vel: 1.65 cm2
AV Mean grad: 8 mmHg
AV Peak grad: 16.2 mmHg
Ao pk vel: 2.01 m/s
Area-P 1/2: 3.37 cm2
Height: 65 in
S' Lateral: 2.3 cm
Weight: 1975.32 oz

## 2019-12-14 LAB — PROTIME-INR
INR: 1.2 (ref 0.8–1.2)
Prothrombin Time: 14.8 seconds (ref 11.4–15.2)

## 2019-12-14 LAB — GLUCOSE, CAPILLARY
Glucose-Capillary: 85 mg/dL (ref 70–99)
Glucose-Capillary: 105 mg/dL — ABNORMAL HIGH (ref 70–99)
Glucose-Capillary: 61 mg/dL — ABNORMAL LOW (ref 70–99)

## 2019-12-14 MED ORDER — CALCIUM ACETATE (PHOS BINDER) 667 MG PO CAPS
667.0000 mg | ORAL_CAPSULE | Freq: Three times a day (TID) | ORAL | Status: DC
  Administered 2019-12-14 – 2019-12-16 (×7): 667 mg via ORAL
  Filled 2019-12-14 (×7): qty 1

## 2019-12-14 MED ORDER — FAMOTIDINE 20 MG PO TABS: 20.0000 mg | ORAL_TABLET | Freq: Two times a day (BID) | ORAL | Status: DC | PRN

## 2019-12-14 MED ORDER — LABETALOL HCL 5 MG/ML IV SOLN: 5.0000 mg | INTRAVENOUS | Status: DC | PRN

## 2019-12-14 NOTE — Progress Notes (Addendum)
Occupational Therapy Treatment Patient Details Name: Katie Nunez MRN: [id number] DOB: [birth year] Today's Date: 12/14/2019    History of present illness The pt is a 64 yo female presenting with acute R-sided weakness, found to have L frontal ICH. PMH includes: anxiety, ESRD on HD TTS, HTN, IBS, RA, fibromyalgia, a fib on Coumadin.    OT comments  Pt seen in conjunction with PT to maximize pts activity tolerance and progress pts towards functional mobility goals. Pt continues to present with R sided hemiplegia, decreased activity tolerance and impaired balance. Worked on sitting balance EOB with pt needing up to MIN A +2 to maintain static sitting, pt would likely benefit from mirror next session to provided visual feedback to self correct sitting balance as pt with good insight into deficits even without visual feedback.Pt was able to progress OOB to chair this session with MOD A +2, pt continues to require physical assist to advance RLE during transfers. Initated education on completed self ROM with using LUE to advance RUE, pt completed light therex as indicated below to progress AROM for higher level BADLs. Pt would continue to benefit from skilled occupational therapy while admitted and after d/c to address the below listed limitations in order to improve overall functional mobility and facilitate independence with BADL participation. DC plan remains appropriate, will follow acutely per POC.    Follow Up Recommendations  CIR;Supervision/Assistance - 24 hour    Equipment Recommendations  None recommended by OT    Recommendations for Other Services      Precautions / Restrictions Precautions Precautions: Fall Precaution Comments: Rt hemiplegia  Restrictions Weight Bearing Restrictions: No       Mobility Bed Mobility Overal bed mobility: Needs Assistance Bed Mobility: Supine to Sit;Rolling Rolling: Mod assist;+2 for physical assistance   Supine to sit: Mod assist      General bed mobility comments: modA to move BLE to EOB and to raise trunk. minA to minG to steady in sitting, MOD A to roll R<>L for posterior pericare  Transfers Overall transfer level: Needs assistance Equipment used: 2 person hand held assist Transfers: Sit to/from [facility] Sit to Stand: Mod assist;+2 physical assistance;+2 safety/equipment Stand pivot transfers: Mod assist;+2 physical assistance;+2 safety/equipment       General transfer comment: assist to steady and power up. Pt able to extend hips when given cues, benefits from assist of 2 to steady and facilitate tw shift to allow for stepping. Cues for posture, hip extension, and head position through transfer.     Balance Overall balance assessment: Needs assistance Sitting-balance support: Feet supported Sitting balance-Leahy Scale: Poor Sitting balance - Comments: requires minA -minG with cues for postural correction to maintain sitting EOB. slow drift posteriorly and to R but pt able to correct when cued.  Postural control: Posterior lean;Right lateral lean Standing balance support: Bilateral upper extremity supported Standing balance-Leahy Scale: Poor Standing balance comment: Pt requires mod A +2 to maintain standing.  Facilitation for hip and knee extension as well as facilitation for trunk extension                            ADL either performed or assessed with clinical judgement   ADL Overall ADL's : Needs assistance/impaired                         Toilet Transfer: Moderate assistance;Maximal assistance;+2 for safety/equipment;Stand-pivot Toilet Transfer Details (indicate cue type  and reason): simulated via functional mobility with MOD- MAX A +2 to pivot to recliner, increased assist needed as pt fatigues Toileting- Clothing Manipulation and Hygiene: Total assistance;Bed level Toileting - Clothing Manipulation Details (indicate cue type and reason): total A for posterior  pericare from bed level     Functional mobility during ADLs: Moderate assistance;+2 for physical assistance;+2 for safety/equipment;Maximal assistance General ADL Comments: pt continues to present with R sided weakness, decreased activity tolerance, and  impaired balance     Vision       Perception     Praxis      Cognition Arousal/Alertness: Awake/alert Behavior During Therapy: WFL for tasks assessed/performed Overall Cognitive Status: Impaired/Different from baseline Area of Impairment: Attention;Memory;Following commands;Safety/judgement;Awareness;Problem solving                   Current Attention Level: Selective Memory: Decreased short-term memory Following Commands: Follows one step commands consistently;Follows multi-step commands with increased time Safety/Judgement: Decreased awareness of deficits;Decreased awareness of safety Awareness: Intellectual Problem Solving: Difficulty sequencing;Requires verbal cues;Requires tactile cues General Comments: Pt benefits from repeated cues for positioning/deficits through session. Agreeable to all education but does need increased cueing for position and sequencing        Exercises Exercises: Other exercises General Exercises - Upper Extremity Shoulder Flexion: Self ROM;Both;5 reps;Seated;Limitations Shoulder Flexion Limitations: needed facilitation at elbow to advance RUE Other Exercises Other Exercises: lap slides x 5, minA to R elbow for assist with forward movement Other Exercises: self ROM of RUE with assist of LUE. Pt also benefits from minA at R elbow to complete full ROM Other Exercises: cerival ROM; lateral flexion R and L, cervical rotation and cervical flexion/ extension   Shoulder Instructions       General Comments VSS on 2L O2; initiated education as using RUE as stabilizer during ADL tasks. Issued pt IS with pt consistently pulling >750 ml during session , education provided on completing 10x every  hour.     Pertinent Vitals/ Pain       Pain Assessment: No/denies pain  Home Living                                          Prior Functioning/Environment              Frequency  Min 2X/week        Progress Toward Goals  OT Goals(current goals can now be found in the care plan section)  Progress towards OT goals: Progressing toward goals  Acute Rehab OT Goals Patient Stated Goal: to be able to walk and to write  OT Goal Formulation: With patient Time For Goal Achievement: 12/27/19 Potential to Achieve Goals: Good  Plan Discharge plan remains appropriate;Frequency remains appropriate    Co-evaluation    PT/OT/SLP Co-Evaluation/Treatment: Yes Reason for Co-Treatment: Complexity of the patient's impairments (multi-system involvement);For patient/therapist safety;To address functional/ADL transfers PT goals addressed during session: Mobility/safety with mobility;Balance;Strengthening/ROM OT goals addressed during session: ADL's and self-care      AM-PAC OT "6 Clicks" Daily Activity     Outcome Measure   Help from another person eating meals?: A Little Help from another person taking care of personal grooming?: A Little Help from another person toileting, which includes using toliet, bedpan, or urinal?: A Lot Help from another person bathing (including washing, rinsing, drying)?: A Lot Help from another person to put on and  taking off regular upper body clothing?: A Lot Help from another person to put on and taking off regular lower body clothing?: A Lot 6 Click Score: 14    End of Session Equipment Utilized During Treatment: Gait belt  OT Visit Diagnosis: Hemiplegia and hemiparesis Hemiplegia - Right/Left: Right Hemiplegia - dominant/non-dominant: Dominant Hemiplegia - caused by: Nontraumatic intracerebral hemorrhage   Activity Tolerance Patient tolerated treatment well   Patient Left in chair;with call bell/phone within reach;with chair  alarm set   Nurse Communication Mobility status        Time: 1352-1436 OT Time Calculation (min): 44 min  Charges: OT General Charges $OT Visit: 1 Visit OT Treatments $Self Care/Home Management : 8-22 mins  Lanier Clam., COTA/L Acute Rehabilitation Services [phone]    Ihor Gully 12/14/2019, 3:42 PM

## 2019-12-14 NOTE — Progress Notes (Signed)
[facility] Progress Note   Subjective: Seen in room. Alert and conversational today. No C/Os. Plans for CIR. Still unable to move RUE/RLL.    Objective Vitals:   12/14/19 0600 12/14/19 0700 12/14/19 0800 12/14/19 0900  BP: 135/78 132/82 (!) 148/86 136/72  Pulse: 66 71 73 75  Resp: (!) 26 (!) 21 18 19   Temp:   98.5 F (36.9 C)   TempSrc:   Oral   SpO2: 97% 98% 100% 99%  Weight:      Height:       Physical Exam General: Ill appearing older female in NAD Heart: S1,S2 SR on monitor. No M/R/G Lungs: CTAB A/T Abdomen: active BS Extremities: No LE edema. Lesion on LLE mid calf has been following with [facility]. Nontender to palpation.   Dialysis Access: L AVF + bruit.    Additional Objective Labs: Basic Metabolic Panel: Recent Labs  Lab 12/12/19 1512 12/12/19 1528 12/13/19 1018  NA 141 140 141  K 4.4 4.7 5.1  CL 102 99 100  CO2  --  27 24  GLUCOSE 85 91 78  BUN 47* 42* 51*  CREATININE 6.30* 6.18* 7.48*  CALCIUM  --  8.8* 8.9  PHOS  --   --  7.7*   Liver Function Tests: Recent Labs  Lab 12/12/19 1528 12/13/19 1018  AST 35  --   ALT 25  --   ALKPHOS 152*  --   BILITOT 0.6  --   PROT 7.2  --   ALBUMIN 2.9* 2.8*   No results for input(s): LIPASE, AMYLASE in the last 168 hours. CBC: Recent Labs  Lab 12/12/19 1512 12/12/19 1528 12/13/19 0828  WBC  --  3.3* 3.8*  NEUTROABS  --  1.8  --   HGB 12.2 11.1* 11.5*  HCT 36.0 34.6* 36.7  MCV  --  94.8 94.6  PLT  --  131* 131*   Blood Culture No results found for: SDES, SPECREQUEST, CULT, REPTSTATUS  Cardiac Enzymes: No results for input(s): CKTOTAL, CKMB, CKMBINDEX, TROPONINI in the last 168 hours. CBG: Recent Labs  Lab 12/13/19 1138 12/13/19 1606 12/13/19 1936 12/13/19 2322 12/14/19 0727  GLUCAP 110* 100* 107* 87 85   Iron Studies: No results for input(s): IRON, TIBC, TRANSFERRIN, FERRITIN in the last 72 hours. @lablastinr3 @ Studies/Results: DG Skull 1-3 Views  Result Date:  12/12/2019 CLINICAL DATA:  Encounter for foreign body in soft tissue. Left arm restricted for dialysis. Went to bed at midnight, woke up at 1 pm today. Flaccid right arm and leg. No vision changes. No facial droop. No withdrawal from on right leg and arm but can feel the pain. Pt does not make urine. No other complaints, headache, or dizziness. EXAM: SKULL - 1-3 VIEW COMPARISON:  None. FINDINGS: No skull fracture or lesion. No radiopaque foreign body.  Soft tissues are unremarkable. IMPRESSION: Negative. Electronically Signed   By: Lajean Manes M.D.   On: 12/12/2019 16:59   CT HEAD WO CONTRAST  Result Date: 12/13/2019 CLINICAL DATA:  Right-sided weakness EXAM: CT HEAD WITHOUT CONTRAST TECHNIQUE: Contiguous axial images were obtained from the base of the skull through the vertex without intravenous contrast. COMPARISON:  12/12/2019 at 6:21 p.m. FINDINGS: Brain: Unchanged size of intraparenchymal hematoma centered in the left frontal lobe measuring 1.7 x 3.1 cm. Surrounding edema is unchanged. The small adjacent satellite hemorrhage is also unchanged. No new site of hemorrhage. No midline shift or other mass effect. Vascular: No hyperdense vessel or unexpected calcification. Skull: Normal. Negative  for fracture or focal lesion. Sinuses/Orbits: No acute finding. Other: None. IMPRESSION: Unchanged size of intraparenchymal hematoma centered in the left frontal lobe with surrounding edema. Electronically Signed   By: Ulyses Jarred M.D.   On: 12/13/2019 03:42   CT HEAD WO CONTRAST  Result Date: 12/12/2019 CLINICAL DATA:  Extremity weakness, stroke, intracranial hemorrhage EXAM: CT HEAD WITHOUT CONTRAST TECHNIQUE: Contiguous axial images were obtained from the base of the skull through the vertex without intravenous contrast. COMPARISON:  12/12/2019 at 5:05 p.m. FINDINGS: Brain: The left frontal intraparenchymal hemorrhage seen previously is again identified, measuring 3.1 x 1.7 by 2.9 cm, not significantly changed  since prior study. Smaller adjacent 0.5 cm focus of hemorrhage is also unchanged. Mild surrounding edema with regional mass effect is stable. No midline shift. Lateral ventricles and midline structures are unremarkable. Vascular: Stable atherosclerosis.  No hyperdense vessel. Skull: Normal. Negative for fracture or focal lesion. Sinuses/Orbits: Stable right maxillary sinus mucosal thickening. Other: Stable calcifications within the bilateral parotid glands. IMPRESSION: 1. Stable left frontal intraparenchymal hemorrhage and mild surrounding edema. Electronically Signed   By: Randa Ngo M.D.   On: 12/12/2019 18:51   CT HEAD WO CONTRAST  Result Date: 12/12/2019 CLINICAL DATA:  Right-sided weakness EXAM: CT HEAD WITHOUT CONTRAST TECHNIQUE: Contiguous axial images were obtained from the base of the skull through the vertex without intravenous contrast. COMPARISON:  None. FINDINGS: Brain: There is acute parenchymal hemorrhage within the posterior left frontal lobe involving the superior frontal and precentral gyri. This measures approximately 2.9 x 1.7 x 2.9 cm. Adjacent subcentimeter focus of hemorrhage. Mild surrounding edema with minor regional mass effect. Gray-white differentiation is preserved. Ventricles and sulci are within normal limits in size and configuration. Vascular: There is atherosclerotic calcification at the skull base. Skull: Calvarium is unremarkable. Sinuses/Orbits: No acute finding. Other: Mastoid air cells are clear. Multiple punctate bilateral parotid calculi. IMPRESSION: Acute left frontal parenchymal hemorrhage with mild edema. No significant mass effect. Critical Value/emergent results were called by telephone at the time of interpretation on 12/12/2019 at 5:17 pm to provider [facility] , who verbally acknowledged these results. Electronically Signed   By: Macy Mis M.D.   On: 12/12/2019 17:22   MR ANGIO HEAD WO CONTRAST  Result Date: 12/13/2019 CLINICAL DATA:   Thrombocytopenia. EXAM: MRI HEAD WITHOUT CONTRAST MRA HEAD WITHOUT CONTRAST TECHNIQUE: Multiplanar, multiecho pulse sequences of the brain and surrounding structures were obtained without intravenous contrast. Angiographic images of the head were obtained using MRA technique without contrast. COMPARISON:  Head CT from earlier today and yesterday FINDINGS: MRI HEAD FINDINGS Brain: T1 and T2 hyperintense hematoma in the high subcortical posterior left frontal lobe which measures 3 x 1.8 cm on axial slices. Subcentimeter adjacent hemorrhage is present within a single rim of vasogenic edema. No underlying infarct is seen. No gross masslike finding detected. There are other remote microhemorrhages with nodular hemosiderin staining seen in the parasagittal right frontal lobe, bilateral anterior frontal white matter, and along the high and lateral left frontal lobe. No generalized chronic white matter disease. No hydrocephalus or extra-axial collection. Vascular: Arterial findings below. Normal dural venous sinus flow voids. Skull and upper cervical spine: Hypointense cervical marrow with mild clivus heterogeneity, likely red marrow reconversion in this dialysis patient. Sinuses/Orbits: Proteinaceous retention cysts in the right maxillary sinus. Other: Nodular appearance of the bilateral parotid glands with a 9 mm T1 hyperintense mass in the superficial left parotid. Small T2 hyperintense cysts in the right parotid. There is known history  of scleroderma. MRA HEAD FINDINGS Vessels are smooth and widely patent.  No aneurysm or stenosis. IMPRESSION: Brain MRI: Known acute hemorrhage in the subcortical left frontal white matter with rim of edema. A few remote hemorrhages have also occurred in the bilateral upper cerebrum suggesting underlying vasculopathy or bleeding diathesis. Notable lack of chronic white matter disease. Intracranial MRA: Negative. Electronically Signed   By: Monte Fantasia M.D.   On: 12/13/2019 07:57    MR BRAIN WO CONTRAST  Result Date: 12/13/2019 CLINICAL DATA:  Thrombocytopenia. EXAM: MRI HEAD WITHOUT CONTRAST MRA HEAD WITHOUT CONTRAST TECHNIQUE: Multiplanar, multiecho pulse sequences of the brain and surrounding structures were obtained without intravenous contrast. Angiographic images of the head were obtained using MRA technique without contrast. COMPARISON:  Head CT from earlier today and yesterday FINDINGS: MRI HEAD FINDINGS Brain: T1 and T2 hyperintense hematoma in the high subcortical posterior left frontal lobe which measures 3 x 1.8 cm on axial slices. Subcentimeter adjacent hemorrhage is present within a single rim of vasogenic edema. No underlying infarct is seen. No gross masslike finding detected. There are other remote microhemorrhages with nodular hemosiderin staining seen in the parasagittal right frontal lobe, bilateral anterior frontal white matter, and along the high and lateral left frontal lobe. No generalized chronic white matter disease. No hydrocephalus or extra-axial collection. Vascular: Arterial findings below. Normal dural venous sinus flow voids. Skull and upper cervical spine: Hypointense cervical marrow with mild clivus heterogeneity, likely red marrow reconversion in this dialysis patient. Sinuses/Orbits: Proteinaceous retention cysts in the right maxillary sinus. Other: Nodular appearance of the bilateral parotid glands with a 9 mm T1 hyperintense mass in the superficial left parotid. Small T2 hyperintense cysts in the right parotid. There is known history of scleroderma. MRA HEAD FINDINGS Vessels are smooth and widely patent.  No aneurysm or stenosis. IMPRESSION: Brain MRI: Known acute hemorrhage in the subcortical left frontal white matter with rim of edema. A few remote hemorrhages have also occurred in the bilateral upper cerebrum suggesting underlying vasculopathy or bleeding diathesis. Notable lack of chronic white matter disease. Intracranial MRA: Negative.  Electronically Signed   By: Monte Fantasia M.D.   On: 12/13/2019 07:57   Medications: . sodium chloride    . sodium chloride    . clevidipine     .  stroke: mapping our early stages of recovery book   Does not apply Once  . ambrisentan  5 mg Oral Daily  . calcium acetate  667 mg Oral 5 X Daily  . Chlorhexidine Gluconate Cloth  6 each Topical Daily  . Chlorhexidine Gluconate Cloth  6 each Topical Q0600  . cinacalcet  30 mg Oral Q T,Th,Sa-HD  . doxercalciferol  6 mcg Intravenous Q T,Th,Sa-HD  . ferric citrate  420 mg Oral TID WC  . labetalol  20 mg Intravenous Once  . pantoprazole (PROTONIX) IV  40 mg Intravenous QHS  . pentoxifylline  400 mg Oral TID WC  . Selexipag  800 mcg Oral Daily  . senna-docusate  1 tablet Oral BID  . tadalafil  20 mg Oral Daily     HD orders: [place] T, Th,S 3.5 hrs 450/600 2.0 K/2.25 Ca 58.5 kg UFP 2 linear Na -No Heparin -Mircera 50 mcg IV q 4 weeks -Hectorol 6 mcg IV TIW -Sensipar 30 mg PO TIW   Assessment/Plan:64 year old black female with multiple medical issues including ESRD. Presents with right hemiplegia secondary to [place] 1ICH-thought secondary to fairly new Coumadin therapy. Coumadin has been held, reversing agents on  board, neurology following, to be admitted to ICU for neuro monitoring. Repeat CT of head 12/02 without evidence of extension of hematoma. Plans for CIR for rehab.  2 ESRD:We will continue with TTS schedule via aVF. Next HD 12/04. Minimal ultrafiltration to avoid blood pressure drop 3 Hypertension/Volume-No evidence of volume overload. Need to avoid hypotension during HD. Keep SBP > 110.   HD 12/02 Net UF 1.5 Post wt 56 kg. She is losing body weight. UF 0.5-1 liter tomorrow. Will need much lower EDW on DC.  4. Anemia of ESRD:Hgb 11.5. No ESA needed.  5. Metabolic Bone Disease: C Ca 9.86 PO4 elevated. Wrong binder orders (She has tried them all!) Requesting calcium acetate which is fine-as long as she will take binder. Will  order.  6.  pulmonary HTN. Follows [facility]ology at [facility]. Currently on tadalafil and Uptravi for her pulmonary hypertension.Marland Kitchen Avoid hypotension.   Canio Winokur H. Maida Widger NP-C 12/14/2019, 11:45 AM  [employer] [phone]

## 2019-12-14 NOTE — Progress Notes (Signed)
PROGRESS NOTE    Katie Nunez  IZT:[id number] DOB: 02-15-1955 DOA: 12/12/2019 PCP: [name], MD    Chief Complaint  Patient presents with  . Extremity Weakness    Right leg and arm    Brief Narrative:   Katie Nunez is a 64 y.o. female with history of ESRD on HD TTS, fibromyalgia,hypertension,mixed connective tissue disorder, scleroderma, pulmonary hypertension,atrial fibrillation, cirrhosis-on Coumadin forportal vein thrombosis and atrial fibrillation, presenting with R sided weakness.    Assessment & Plan:   Active Problems:   ICH (intracerebral hemorrhage) (HCC)   Left frontal ICH due to warfarin coagulopathy: Repeat CT head stable.   echocardiogram pending.  Neurology recommends holding anti coagulation for 2 weeks.  Therapy eval recommending CIR.    Atrial fib  Rate controlled.  Anti coagulation on hold for the bleed.    Essential hypertension;  SBP goal is <160 mmhg.    Hyperlipidemia:  Hold statin for now.    ESRD on HD - nephrology on board.     DVT prophylaxis: (SCD'S) Code Status: full code.  Family Communication: (family at bedside.  Disposition:   Status is: Inpatient  Remains inpatient appropriate because:Unsafe d/c plan   Dispo: The patient is from: Home              Anticipated d/c is to: CIR              Anticipated d/c date is: 3 days              Patient currently is medically stable to d/c.       Consultants:   Neurology     Procedures:    Antimicrobials: none.    Subjective: No new complaints.   Objective: Vitals:   12/14/19 1400 12/14/19 1500 12/14/19 1600 12/14/19 1700  BP: 112/66 129/65 (!) 113/57 124/73  Pulse:   (!) 27 78  Resp: 15 19 (!) 24 19  Temp:      TempSrc:      SpO2:   98% 90%  Weight:      Height:        Intake/Output Summary (Last 24 hours) at 12/14/2019 1755 Last data filed at 12/14/2019 1200 Gross per 24 hour  Intake 480 ml  Output --  Net 480 ml   Filed  Weights   12/12/19 2112 12/13/19 1730  Weight: 57.4 kg 56 kg    Examination:  General exam: Appears calm and comfortable  Respiratory system: Clear to auscultation. Respiratory effort normal. Cardiovascular system: S1 & S2 heard, irregularly irregular.  No pedal edema. Gastrointestinal system: Abdomen is nondistended, soft and nontender.Normal bowel sounds heard. Central nervous system: Alert and oriented. Right upper and lower ext hemiplegia.  Extremities: no edema.  Skin: No rashes, lesions or ulcers Psychiatry:  Mood & affect appropriate.     Data Reviewed: I have personally reviewed following labs and imaging studies  CBC: Recent Labs  Lab 12/12/19 1512 12/12/19 1528 12/13/19 0828  WBC  --  3.3* 3.8*  NEUTROABS  --  1.8  --   HGB 12.2 11.1* 11.5*  HCT 36.0 34.6* 36.7  MCV  --  94.8 94.6  PLT  --  131* 131*    Basic Metabolic Panel: Recent Labs  Lab 12/12/19 1512 12/12/19 1528 12/13/19 1018  NA 141 140 141  K 4.4 4.7 5.1  CL 102 99 100  CO2  --  27 24  GLUCOSE 85 91 78  BUN 47* 42* 51*  CREATININE 6.30* 6.18* 7.48*  CALCIUM  --  8.8* 8.9  PHOS  --   --  7.7*    GFR: Estimated Creatinine Clearance: 7 mL/min (A) (by C-G formula based on SCr of 7.48 mg/dL (H)).  Liver Function Tests: Recent Labs  Lab 12/12/19 1528 12/13/19 1018  AST 35  --   ALT 25  --   ALKPHOS 152*  --   BILITOT 0.6  --   PROT 7.2  --   ALBUMIN 2.9* 2.8*    CBG: Recent Labs  Lab 12/13/19 1936 12/13/19 2322 12/14/19 0727 12/14/19 1213 12/14/19 1619  GLUCAP 107* 87 85 105* 61*     Recent Results (from the past 240 hour(s))  MRSA PCR Screening     Status: None   Collection Time: 12/12/19  9:15 PM   Specimen: Nasal Mucosa; Nasopharyngeal  Result Value Ref Range Status   MRSA by PCR NEGATIVE NEGATIVE Final    Comment:        The GeneXpert MRSA Assay (FDA approved for NASAL specimens only), is one component of a comprehensive MRSA colonization surveillance program.  It is not intended to diagnose MRSA infection nor to guide or monitor treatment for MRSA infections. Performed at [facility], [place] [address] [place], [place] [address]   Resp Panel by RT-PCR (Flu A&B, Covid) Nasopharyngeal Swab     Status: None   Collection Time: 12/12/19 11:39 PM   Specimen: Nasopharyngeal Swab; Nasopharyngeal(NP) swabs in vial transport medium  Result Value Ref Range Status   SARS Coronavirus 2 by RT PCR NEGATIVE NEGATIVE Final    Comment: (NOTE) SARS-CoV-2 target nucleic acids are NOT DETECTED.  The SARS-CoV-2 RNA is generally detectable in upper respiratory specimens during the acute phase of infection. The lowest concentration of SARS-CoV-2 viral copies this assay can detect is 138 copies/mL. A negative result does not preclude SARS-Cov-2 infection and should not be used as the sole basis for treatment or other patient management decisions. A negative result may occur with  improper specimen collection/handling, submission of specimen other than nasopharyngeal swab, presence of viral mutation(s) within the areas targeted by this assay, and inadequate number of viral copies(<138 copies/mL). A negative result must be combined with clinical observations, patient history, and epidemiological information. The expected result is Negative.  Fact Sheet for Patients:  EntrepreneurPulse.com.au  Fact Sheet for Healthcare Providers:  IncredibleEmployment.be  This test is no t yet approved or cleared by the [place] FDA and  has been authorized for detection and/or diagnosis of SARS-CoV-2 by FDA under an Emergency Use Authorization (EUA). This EUA will remain  in effect (meaning this test can be used) for the duration of the COVID-19 declaration under Section 564(b)(1) of the Act, 21 U.S.C.section 360bbb-3(b)(1), unless the authorization is terminated  or revoked sooner.       Influenza A by PCR NEGATIVE NEGATIVE  Final   Influenza B by PCR NEGATIVE NEGATIVE Final    Comment: (NOTE) The Xpert Xpress SARS-CoV-2/FLU/RSV plus assay is intended as an aid in the diagnosis of influenza from Nasopharyngeal swab specimens and should not be used as a sole basis for treatment. Nasal washings and aspirates are unacceptable for Xpert Xpress SARS-CoV-2/FLU/RSV testing.  Fact Sheet for Patients: EntrepreneurPulse.com.au  Fact Sheet for Healthcare Providers: IncredibleEmployment.be  This test is not yet approved or cleared by the [place] FDA and has been authorized for detection and/or diagnosis of SARS-CoV-2 by FDA under an Emergency Use Authorization (EUA). This EUA will  remain in effect (meaning this test can be used) for the duration of the COVID-19 declaration under Section 564(b)(1) of the Act, 21 U.S.C. section 360bbb-3(b)(1), unless the authorization is terminated or revoked.  Performed at [facility], [place] [address], [place], [place] [address]          Radiology Studies: CT HEAD WO CONTRAST  Result Date: 12/13/2019 CLINICAL DATA:  Right-sided weakness EXAM: CT HEAD WITHOUT CONTRAST TECHNIQUE: Contiguous axial images were obtained from the base of the skull through the vertex without intravenous contrast. COMPARISON:  12/12/2019 at 6:21 p.m. FINDINGS: Brain: Unchanged size of intraparenchymal hematoma centered in the left frontal lobe measuring 1.7 x 3.1 cm. Surrounding edema is unchanged. The small adjacent satellite hemorrhage is also unchanged. No new site of hemorrhage. No midline shift or other mass effect. Vascular: No hyperdense vessel or unexpected calcification. Skull: Normal. Negative for fracture or focal lesion. Sinuses/Orbits: No acute finding. Other: None. IMPRESSION: Unchanged size of intraparenchymal hematoma centered in the left frontal lobe with surrounding edema. Electronically Signed   By: Ulyses Jarred M.D.   On: 12/13/2019 03:42    CT HEAD WO CONTRAST  Result Date: 12/12/2019 CLINICAL DATA:  Extremity weakness, stroke, intracranial hemorrhage EXAM: CT HEAD WITHOUT CONTRAST TECHNIQUE: Contiguous axial images were obtained from the base of the skull through the vertex without intravenous contrast. COMPARISON:  12/12/2019 at 5:05 p.m. FINDINGS: Brain: The left frontal intraparenchymal hemorrhage seen previously is again identified, measuring 3.1 x 1.7 by 2.9 cm, not significantly changed since prior study. Smaller adjacent 0.5 cm focus of hemorrhage is also unchanged. Mild surrounding edema with regional mass effect is stable. No midline shift. Lateral ventricles and midline structures are unremarkable. Vascular: Stable atherosclerosis.  No hyperdense vessel. Skull: Normal. Negative for fracture or focal lesion. Sinuses/Orbits: Stable right maxillary sinus mucosal thickening. Other: Stable calcifications within the bilateral parotid glands. IMPRESSION: 1. Stable left frontal intraparenchymal hemorrhage and mild surrounding edema. Electronically Signed   By: Randa Ngo M.D.   On: 12/12/2019 18:51   MR ANGIO HEAD WO CONTRAST  Result Date: 12/13/2019 CLINICAL DATA:  Thrombocytopenia. EXAM: MRI HEAD WITHOUT CONTRAST MRA HEAD WITHOUT CONTRAST TECHNIQUE: Multiplanar, multiecho pulse sequences of the brain and surrounding structures were obtained without intravenous contrast. Angiographic images of the head were obtained using MRA technique without contrast. COMPARISON:  Head CT from earlier today and yesterday FINDINGS: MRI HEAD FINDINGS Brain: T1 and T2 hyperintense hematoma in the high subcortical posterior left frontal lobe which measures 3 x 1.8 cm on axial slices. Subcentimeter adjacent hemorrhage is present within a single rim of vasogenic edema. No underlying infarct is seen. No gross masslike finding detected. There are other remote microhemorrhages with nodular hemosiderin staining seen in the parasagittal right frontal lobe,  bilateral anterior frontal white matter, and along the high and lateral left frontal lobe. No generalized chronic white matter disease. No hydrocephalus or extra-axial collection. Vascular: Arterial findings below. Normal dural venous sinus flow voids. Skull and upper cervical spine: Hypointense cervical marrow with mild clivus heterogeneity, likely red marrow reconversion in this dialysis patient. Sinuses/Orbits: Proteinaceous retention cysts in the right maxillary sinus. Other: Nodular appearance of the bilateral parotid glands with a 9 mm T1 hyperintense mass in the superficial left parotid. Small T2 hyperintense cysts in the right parotid. There is known history of scleroderma. MRA HEAD FINDINGS Vessels are smooth and widely patent.  No aneurysm or stenosis. IMPRESSION: Brain MRI: Known acute hemorrhage in the subcortical left frontal white  matter with rim of edema. A few remote hemorrhages have also occurred in the bilateral upper cerebrum suggesting underlying vasculopathy or bleeding diathesis. Notable lack of chronic white matter disease. Intracranial MRA: Negative. Electronically Signed   By: Monte Fantasia M.D.   On: 12/13/2019 07:57   MR BRAIN WO CONTRAST  Result Date: 12/13/2019 CLINICAL DATA:  Thrombocytopenia. EXAM: MRI HEAD WITHOUT CONTRAST MRA HEAD WITHOUT CONTRAST TECHNIQUE: Multiplanar, multiecho pulse sequences of the brain and surrounding structures were obtained without intravenous contrast. Angiographic images of the head were obtained using MRA technique without contrast. COMPARISON:  Head CT from earlier today and yesterday FINDINGS: MRI HEAD FINDINGS Brain: T1 and T2 hyperintense hematoma in the high subcortical posterior left frontal lobe which measures 3 x 1.8 cm on axial slices. Subcentimeter adjacent hemorrhage is present within a single rim of vasogenic edema. No underlying infarct is seen. No gross masslike finding detected. There are other remote microhemorrhages with nodular  hemosiderin staining seen in the parasagittal right frontal lobe, bilateral anterior frontal white matter, and along the high and lateral left frontal lobe. No generalized chronic white matter disease. No hydrocephalus or extra-axial collection. Vascular: Arterial findings below. Normal dural venous sinus flow voids. Skull and upper cervical spine: Hypointense cervical marrow with mild clivus heterogeneity, likely red marrow reconversion in this dialysis patient. Sinuses/Orbits: Proteinaceous retention cysts in the right maxillary sinus. Other: Nodular appearance of the bilateral parotid glands with a 9 mm T1 hyperintense mass in the superficial left parotid. Small T2 hyperintense cysts in the right parotid. There is known history of scleroderma. MRA HEAD FINDINGS Vessels are smooth and widely patent.  No aneurysm or stenosis. IMPRESSION: Brain MRI: Known acute hemorrhage in the subcortical left frontal white matter with rim of edema. A few remote hemorrhages have also occurred in the bilateral upper cerebrum suggesting underlying vasculopathy or bleeding diathesis. Notable lack of chronic white matter disease. Intracranial MRA: Negative. Electronically Signed   By: Monte Fantasia M.D.   On: 12/13/2019 07:57        Scheduled Meds: .  stroke: mapping our early stages of recovery book   Does not apply Once  . ambrisentan  5 mg Oral Daily  . calcium acetate  667 mg Oral TID WC  . Chlorhexidine Gluconate Cloth  6 each Topical Daily  . Chlorhexidine Gluconate Cloth  6 each Topical Q0600  . cinacalcet  30 mg Oral Q T,Th,Sa-HD  . doxercalciferol  6 mcg Intravenous Q T,Th,Sa-HD  . pentoxifylline  400 mg Oral TID WC  . Selexipag  800 mcg Oral Daily  . senna-docusate  1 tablet Oral BID  . tadalafil  20 mg Oral Daily   Continuous Infusions: . sodium chloride    . sodium chloride       LOS: 2 days        Hosie Poisson, MD [facility]   To contact the attending provider between 7A-7P or  the covering provider during after hours 7P-7A, please log into the web site [email or web address] and access using universal [address] password for that web site. If you do not have the password, please call the hospital operator.  12/14/2019, 5:55 PM

## 2019-12-14 NOTE — Progress Notes (Signed)
Physical Therapy Treatment Patient Details Name: Katie Nunez MRN: [id number] DOB: [birth year] Today's Date: 12/14/2019    History of Present Illness The pt is a 64 yo female presenting with acute R-sided weakness, found to have L frontal ICH. PMH includes: anxiety, ESRD on HD TTS, HTN, IBS, RA, fibromyalgia, a fib on Coumadin.     PT Comments    The pt is making good progress with mobility and PT goals at this time. The pt was able to complete bed mobility with less assist and maintain improved seated stability with significantly less assist (verbal cues only at times today). The pt was able to complete transfer to recliner using short bout of lateral stepping with LLE leading, but continues to require modA of 2 to maintain upright position, block R knee, and facilitate movement of RLE for each step. The pt remains highly motivated and was educated in HEP of neck stretches and self-ROM for her RUE. Continue to recommend CIR to facilitate return to prior level of mobility and independence.     Follow Up Recommendations  CIR     Equipment Recommendations  Other (comment) (defer to post acute)    Recommendations for Other Services       Precautions / Restrictions Precautions Precautions: Fall Precaution Comments: Rt hemiplegia  Restrictions Weight Bearing Restrictions: No    Mobility  Bed Mobility Overal bed mobility: Needs Assistance Bed Mobility: Supine to Sit     Supine to sit: Mod assist     General bed mobility comments: modA to move BLE to EOB and to raise trunk. minA to minG to steady in sitting  Transfers Overall transfer level: Needs assistance Equipment used: 2 person hand held assist Transfers: Sit to/from [facility] Sit to Stand: Mod assist;+2 physical assistance;+2 safety/equipment Stand pivot transfers: Mod assist;+2 physical assistance;+2 safety/equipment       General transfer comment: assist to steady and power up. Pt able to  extend hips when given cues, benefits from assist of 2 to steady and facilitate tw shift to allow for stepping. Cues for posture, hip extension, and head position through transfer.   Ambulation/Gait Ambulation/Gait assistance: Mod assist;+2 physical assistance Gait Distance (Feet): 4 Feet Assistive device: 2 person hand held assist Gait Pattern/deviations: Step-to pattern;Decreased stride length;Trunk flexed;Narrow base of support Gait velocity: decreased Gait velocity interpretation: <1.31 ft/sec, indicative of household ambulator General Gait Details: assist of 2 to maintain posture and upright. blocking to R knee, facilitation of wt shfit to L and modA to complete movement of RLE.       Modified Rankin (Stroke Patients Only) Modified Rankin (Stroke Patients Only) Pre-Morbid Rankin Score: Slight disability Modified Rankin: Moderately severe disability     Balance Overall balance assessment: Needs assistance Sitting-balance support: Feet supported Sitting balance-Leahy Scale: Poor Sitting balance - Comments: requires minA -minG with cues for postural correction to maintain sitting EOB. slow drift posteriorly and to R but pt able to correct when cued.  Postural control: Posterior lean;Right lateral lean Standing balance support: Bilateral upper extremity supported Standing balance-Leahy Scale: Poor Standing balance comment: Pt requires mod A +2 to maintain standing.  Facilitation for hip and knee extension as well as facilitation for trunk extension                             Cognition Arousal/Alertness: Awake/alert Behavior During Therapy: WFL for tasks assessed/performed Overall Cognitive Status: Impaired/Different from baseline Area of Impairment: Attention;Memory;Following commands;Safety/judgement;Awareness;Problem  solving                   Current Attention Level: Selective Memory: Decreased short-term memory Following Commands: Follows one step  commands consistently;Follows multi-step commands with increased time Safety/Judgement: Decreased awareness of deficits;Decreased awareness of safety Awareness: Intellectual Problem Solving: Difficulty sequencing;Requires verbal cues;Requires tactile cues General Comments: Pt benefits from repeated cues for positioning/deficits through session. Agreeable to all education but does need increased cueing for position and sequencing      Exercises Other Exercises Other Exercises: lap slides x 5, minA to R elbow for assist with forward movement Other Exercises: self ROM of RUE with assist of LUE. Pt also benefits from minA at R elbow to complete full ROM    General Comments General comments (skin integrity, edema, etc.): VSS on 2L O2      Pertinent Vitals/Pain Pain Assessment: No/denies pain           PT Goals (current goals can now be found in the care plan section) Acute Rehab PT Goals Patient Stated Goal: to be able to walk and to write  PT Goal Formulation: With patient Time For Goal Achievement: 12/27/19 Potential to Achieve Goals: Good Progress towards PT goals: Progressing toward goals    Frequency    Min 4X/week      PT Plan Current plan remains appropriate    Co-evaluation PT/OT/SLP Co-Evaluation/Treatment: Yes Reason for Co-Treatment: Complexity of the patient's impairments (multi-system involvement);For patient/therapist safety;To address functional/ADL transfers PT goals addressed during session: Mobility/safety with mobility;Balance;Strengthening/ROM        AM-PAC PT "6 Clicks" Mobility   Outcome Measure  Help needed turning from your back to your side while in a flat bed without using bedrails?: A Lot Help needed moving from lying on your back to sitting on the side of a flat bed without using bedrails?: A Lot Help needed moving to and from a bed to a chair (including a wheelchair)?: A Lot Help needed standing up from a chair using your arms (e.g.,  wheelchair or bedside chair)?: A Lot Help needed to walk in hospital room?: A Lot Help needed climbing 3-5 steps with a railing? : Total 6 Click Score: 11    End of Session Equipment Utilized During Treatment: Gait belt Activity Tolerance: Patient tolerated treatment well;Patient limited by fatigue Patient left: in chair;with call bell/phone within reach;with chair alarm set Nurse Communication: Mobility status PT Visit Diagnosis: Other abnormalities of gait and mobility (R26.89);Muscle weakness (generalized) (M62.81);Hemiplegia and hemiparesis Hemiplegia - Right/Left: Right Hemiplegia - dominant/non-dominant: Dominant Hemiplegia - caused by: Other Nontraumatic intracranial hemorrhage     Time: 1352-1436 PT Time Calculation (min) (ACUTE ONLY): 44 min  Charges:  $Gait Training: 8-22 mins $Therapeutic Activity: 8-22 mins                     Karma Ganja, PT, DPT   Acute Rehabilitation Department Pager #: [phone]   Otho Bellows 12/14/2019, 2:56 PM

## 2019-12-14 NOTE — Progress Notes (Signed)
Inpatient Rehab Admissions:  Inpatient Rehab Consult received.  I spoke to the pt's husband on the phone for rehabilitation assessment and to discuss goals and expectations of an inpatient rehab admission.  Per RN, pt participating with therapy, so I will give her a call on her cell later today.  Per spouse, they are aware and in agreement with recommendations for CIR.  I explained expected length of stay 18-21 days, with goals of supervision to min assist with PT/OT and independence with SLP.  Per spouse, this is acceptable.  Will start request for insurance authorization today, and will f/u with patient.    Signed: Shann Medal, PT, DPT Admissions Coordinator [phone] 12/14/19  2:36 PM

## 2019-12-14 NOTE — Progress Notes (Signed)
  Echocardiogram 2D Echocardiogram has been performed.  Katie Nunez 12/14/2019, 5:54 PM

## 2019-12-14 NOTE — Progress Notes (Signed)
STROKE TEAM PROGRESS NOTE   INTERVAL HISTORY Son at bedside.  Patient lying in bed, no acute event overnight.  Had HD yesterday.  Still has right hemiplegia.  Vitals:   12/14/19 0600 12/14/19 0700 12/14/19 0800 12/14/19 0900  BP: 135/78 132/82 (!) 148/86 136/72  Pulse: 66 71 73 75  Resp: (!) 26 (!) 21 18 19   Temp:   98.5 F (36.9 C)   TempSrc:   Oral   SpO2: 97% 98% 100% 99%  Weight:      Height:       CBC:  Recent Labs  Lab 12/12/19 1528 12/13/19 0828  WBC 3.3* 3.8*  NEUTROABS 1.8  --   HGB 11.1* 11.5*  HCT 34.6* 36.7  MCV 94.8 94.6  PLT 131* 132*   Basic Metabolic Panel:  Recent Labs  Lab 12/12/19 1528 12/13/19 1018  NA 140 141  K 4.7 5.1  CL 99 100  CO2 27 24  GLUCOSE 91 78  BUN 42* 51*  CREATININE 6.18* 7.48*  CALCIUM 8.8* 8.9  PHOS  --  7.7*   Lipid Panel:  Recent Labs  Lab 12/12/19 2043  CHOL 194  TRIG 48  HDL 105  CHOLHDL 1.8  VLDL 10  LDLCALC 79   HgbA1c:  Recent Labs  Lab 12/12/19 2042  HGBA1C 5.2   Urine Drug Screen: No results for input(s): LABOPIA, COCAINSCRNUR, LABBENZ, AMPHETMU, THCU, LABBARB in the last 168 hours.  Alcohol Level  Recent Labs  Lab 12/12/19 1448  ETH <10    IMAGING past 24 hours No results found.  PHYSICAL EXAM    Temp:  [97.5 F (36.4 C)-98.6 F (37 C)] 98.5 F (36.9 C) (12/03 0800) Pulse Rate:  [40-83] 75 (12/03 0900) Resp:  [13-31] 19 (12/03 0900) BP: (121-157)/(61-100) 136/72 (12/03 0900) SpO2:  [91 %-100 %] 99 % (12/03 0900) Weight:  [56 kg] 56 kg (12/02 1730)  General - Well nourished, well developed, in no apparent distress.  Ophthalmologic - fundi not visualized due to noncooperation.  Cardiovascular - irregularly irregular heart rate and rhythm.  Mental Status -  Level of arousal and orientation to time, place, and person were intact. Language including expression, naming, repetition, comprehension was assessed and found intact. Fund of Knowledge was assessed and was intact.  Cranial  Nerves II - XII - II - Visual field intact OU. III, IV, VI - Extraocular movements intact. V - Facial sensation intact bilaterally. VII - Facial movement intact bilaterally. VIII - Hearing & vestibular intact bilaterally. X - Palate elevates symmetrically. XI - Chin turning & shoulder shrug intact bilaterally. XII - Tongue protrusion intact.  Motor Strength - The patient's strength was normal in LUE and LLE, however, right upper and lower extremity hemiplegia 0/5.  Bulk was normal and fasciculations were absent.   Motor Tone - Muscle tone was assessed at the neck and appendages and was normal.  Reflexes - The patient's reflexes were diminished on the right and she had no pathological reflexes.  Sensory - Light touch, temperature/pinprick were assessed and were symmetrical.    Coordination - The patient had normal movements in the left hand with no ataxia or dysmetria.  Tremor was absent.  Gait and Station - deferred.   ASSESSMENT/PLAN Katie Nunez is a 64 y.o. female with history of ESRD on HD TTS, fibromyalgia,hypertension,mixed connective tissue disorder, scleroderma, pulmonary hypertension,atrial fibrillation, cirrhosis-on Coumadin forportal vein thrombosis and atrial fibrillation, presenting with R sided weakness.   Stroke:   L frontal  IPH d/t warfarin coagulopathy   CT head acute L frontal IPH w/ mild edema  Repeat CT head stable L frontal IPH  Repeat CT head unchanged L frontal IPH  MRI  Subcortical L frontal white matter IPH w/ rim edema. few remote B hemorrhages upper cerebrum.  MRA  Unremarkable   2D Echo EF 65 to 70%  LDL 79  HgbA1c 5.2  VTE prophylaxis - SCDs   warfarin daily prior to admission, now on No antithrombotic given IPH  Therapy recommendations:  CIR. Consult placed.   Disposition:  pending   Atrial Fibrillation  Home anticoagulation:  warfarin daily   INR 1.7 on admission   Reversed w/ vit K, KCentra   Last INR 1.2 . May  consider resume coumadin or consider eliquis in the OP setting once ICH resolves - follow up in the clinic to decide   Hypertension  Home meds:  No meds listed  SBP goal < 160 . Long-term BP goal normotensive  Hyperlipidemia  Home meds:  No statin  LDL 79, goal < 70  Hold statin consideration in setting of hemorrhage   Consider statin at discharge / follow-up  ESRD   On HD at TTS at [facility]  Nephrology on board  HD today  Other Stroke Risk Factors  Advanced Age >/= 58   Obstructive sleep apnea  Other Active Problems  Anemia of ESRD  Metabolic bone dz on Vit D, Sensipar and Auryxia/Renvela  Pulm HTN ([facility])  LLE wd s/p I7D several wks ago, healing. WOC saw. Dressing orders in place  [facility] day # 2  Neurology will sign off. Please call with questions. Pt will follow up with stroke clinic NP at [facility] in about 4 weeks. Thanks for the consult.    Rosalin Hawking, MD PhD Stroke Neurology 12/14/2019 10:29 AM   To contact Stroke Continuity provider, please refer to [email or web address] After hours, contact General Neurology

## 2019-12-15 ENCOUNTER — Inpatient Hospital Stay (HOSPITAL_COMMUNITY): Payer: Medicare Other

## 2019-12-15 DIAGNOSIS — N186 End stage renal disease: Secondary | ICD-10-CM

## 2019-12-15 DIAGNOSIS — Z992 Dependence on renal dialysis: Secondary | ICD-10-CM

## 2019-12-15 DIAGNOSIS — Z7901 Long term (current) use of anticoagulants: Secondary | ICD-10-CM

## 2019-12-15 DIAGNOSIS — I482 Chronic atrial fibrillation, unspecified: Secondary | ICD-10-CM

## 2019-12-15 LAB — GLUCOSE, CAPILLARY
Glucose-Capillary: 66 mg/dL — ABNORMAL LOW (ref 70–99)
Glucose-Capillary: 82 mg/dL (ref 70–99)
Glucose-Capillary: 84 mg/dL (ref 70–99)
Glucose-Capillary: 97 mg/dL (ref 70–99)

## 2019-12-15 MED ORDER — LORATADINE 10 MG PO TABS
10.0000 mg | ORAL_TABLET | Freq: Every day | ORAL | Status: DC
  Administered 2019-12-15 – 2019-12-18 (×4): 10 mg via ORAL
  Filled 2019-12-15 (×4): qty 1

## 2019-12-15 MED ORDER — FLUTICASONE PROPIONATE 50 MCG/ACT NA SUSP
1.0000 | Freq: Every day | NASAL | Status: DC
  Administered 2019-12-15 – 2019-12-18 (×4): 1 via NASAL
  Filled 2019-12-15 (×2): qty 16

## 2019-12-15 MED ORDER — ACETAMINOPHEN 325 MG PO TABS
ORAL_TABLET | ORAL | Status: AC
  Administered 2019-12-15: 650 mg
  Filled 2019-12-15: qty 2

## 2019-12-15 MED ORDER — SALINE SPRAY 0.65 % NA SOLN
1.0000 | NASAL | Status: DC | PRN
  Filled 2019-12-15: qty 44

## 2019-12-15 MED ORDER — DIPHENHYDRAMINE HCL 50 MG/ML IJ SOLN
12.5000 mg | Freq: Once | INTRAMUSCULAR | Status: AC
  Administered 2019-12-15: 12.5 mg via INTRAVENOUS
  Filled 2019-12-15: qty 1

## 2019-12-15 MED ORDER — PANTOPRAZOLE SODIUM 40 MG PO TBEC
40.0000 mg | DELAYED_RELEASE_TABLET | Freq: Every day | ORAL | Status: DC
  Administered 2019-12-15 – 2019-12-18 (×4): 40 mg via ORAL
  Filled 2019-12-15 (×4): qty 1

## 2019-12-15 NOTE — Progress Notes (Signed)
[facility] Progress Note   Subjective: Seen in room, no c/o today.   Objective Vitals:   12/15/19 0343 12/15/19 0710 12/15/19 0715 12/15/19 1234  BP: 116/85 120/60 120/60 121/78  Pulse: 70 72 69 78  Resp: 20 18 18 20   Temp: (!) 97.5 F (36.4 C)  97.6 F (36.4 C) (!) 97.5 F (36.4 C)  TempSrc: Axillary  Axillary Axillary  SpO2: 96% 99% 99% 100%  Weight:      Height:       Physical Exam General: Ill appearing older female in NAD Heart: S1,S2 SR on monitor. No M/R/G Lungs: CTAB A/T Abdomen: active BS Extremities: No LE edema. Lesion on LLE mid calf has been following with [facility]. Nontender to palpation.   Dialysis Access: L AVF + bruit.    Additional Objective Labs: Basic Metabolic Panel: Recent Labs  Lab 12/12/19 1512 12/12/19 1528 12/13/19 1018  NA 141 140 141  K 4.4 4.7 5.1  CL 102 99 100  CO2  --  27 24  GLUCOSE 85 91 78  BUN 47* 42* 51*  CREATININE 6.30* 6.18* 7.48*  CALCIUM  --  8.8* 8.9  PHOS  --   --  7.7*   Liver Function Tests: Recent Labs  Lab 12/12/19 1528 12/13/19 1018  AST 35  --   ALT 25  --   ALKPHOS 152*  --   BILITOT 0.6  --   PROT 7.2  --   ALBUMIN 2.9* 2.8*   No results for input(s): LIPASE, AMYLASE in the last 168 hours. CBC: Recent Labs  Lab 12/12/19 1512 12/12/19 1528 12/13/19 0828  WBC  --  3.3* 3.8*  NEUTROABS  --  1.8  --   HGB 12.2 11.1* 11.5*  HCT 36.0 34.6* 36.7  MCV  --  94.8 94.6  PLT  --  131* 131*   Blood Culture No results found for: SDES, SPECREQUEST, CULT, REPTSTATUS  Cardiac Enzymes: No results for input(s): CKTOTAL, CKMB, CKMBINDEX, TROPONINI in the last 168 hours. CBG: Recent Labs  Lab 12/14/19 1619 12/15/19 0320 12/15/19 0346 12/15/19 0625 12/15/19 1242  GLUCAP 61* 66* 82 84 97   Iron Studies: No results for input(s): IRON, TIBC, TRANSFERRIN, FERRITIN in the last 72 hours. @lablastinr3 @ Studies/Results: ECHOCARDIOGRAM COMPLETE  Result Date: 12/14/2019    ECHOCARDIOGRAM  REPORT   Patient Name:   Katie Nunez Date of Exam: 12/14/2019 Medical Rec #:  [id number]        Height:       65.0 in Accession #:    [id number]       Weight:       123.5 lb Date of Birth:  08-17-1955       BSA:          1.611 m Patient Age:    64 years         BP:           113/57 mmHg Patient Gender: F                HR:           73 bpm. Exam Location:  Inpatient Procedure: 2D Echo Indications:    stroke 434.91  History:        Patient has prior history of Echocardiogram examinations, most                 recent 10/19/2017. Scleroderma. Raynauds. Pulmonary hypertension.  Cirrhosis. End stage renal disease.; Arrythmias:Atrial                 Fibrillation.  Sonographer:    Johny Chess Referring Phys: [id number] ASHISH ARORA IMPRESSIONS  1. Left ventricular ejection fraction, by estimation, is 65 to 70%. The left ventricle has normal function. The left ventricle has no regional wall motion abnormalities. There is mild left ventricular hypertrophy. Left ventricular diastolic parameters are consistent with Grade II diastolic dysfunction (pseudonormalization). Elevated left atrial pressure. There is the interventricular septum is flattened in systole and diastole, consistent with right ventricular pressure and volume overload.  2. Right ventricular systolic function is moderately reduced. Hypokinesis of free wall. The right ventricular size is mildly enlarged. There is severely elevated pulmonary artery systolic pressure. The estimated right ventricular systolic pressure is 71.0 mmHg.  3. Left atrial size was mildly dilated.  4. The mitral valve is normal in structure. Trivial mitral valve regurgitation. No evidence of mitral stenosis.  5. Tricuspid valve regurgitation is mild to moderate.  6. The aortic valve is tricuspid. Aortic valve regurgitation is trivial. Mild aortic valve sclerosis is present, with no evidence of aortic valve stenosis.  7. The inferior vena cava is dilated in size with <50%  respiratory variability, suggesting right atrial pressure of 15 mmHg. FINDINGS  Left Ventricle: Left ventricular ejection fraction, by estimation, is 65 to 70%. The left ventricle has normal function. The left ventricle has no regional wall motion abnormalities. The left ventricular internal cavity size was normal in size. There is  mild left ventricular hypertrophy. The interventricular septum is flattened in systole and diastole, consistent with right ventricular pressure and volume overload. Left ventricular diastolic parameters are consistent with Grade II diastolic dysfunction  (pseudonormalization). Elevated left atrial pressure. Right Ventricle: The right ventricular size is mildly enlarged. Right vetricular wall thickness was not assessed. Right ventricular systolic function is moderately reduced. There is severely elevated pulmonary artery systolic pressure. The tricuspid regurgitant velocity is 3.69 m/s, and with an assumed right atrial pressure of 15 mmHg, the estimated right ventricular systolic pressure is 62.6 mmHg. Left Atrium: Left atrial size was mildly dilated. Right Atrium: Right atrial size was normal in size. Pericardium: There is no evidence of pericardial effusion. Mitral Valve: The mitral valve is normal in structure. Trivial mitral valve regurgitation. No evidence of mitral valve stenosis. Tricuspid Valve: The tricuspid valve is normal in structure. Tricuspid valve regurgitation is mild to moderate. Aortic Valve: The aortic valve is tricuspid. Aortic valve regurgitation is trivial. Mild aortic valve sclerosis is present, with no evidence of aortic valve stenosis. Aortic valve mean gradient measures 8.0 mmHg. Aortic valve peak gradient measures 16.2 mmHg. Aortic valve area, by VTI measures 1.80 cm. Pulmonic Valve: The pulmonic valve was grossly normal. Pulmonic valve regurgitation is mild. Aorta: The aortic root and ascending aorta are structurally normal, with no evidence of dilitation.  Venous: The inferior vena cava is dilated in size with less than 50% respiratory variability, suggesting right atrial pressure of 15 mmHg. IAS/Shunts: The interatrial septum was not well visualized.  LEFT VENTRICLE PLAX 2D LVIDd:         4.20 cm  Diastology LVIDs:         2.30 cm  LV e' medial:    6.09 cm/s LV PW:         0.90 cm  LV E/e' medial:  14.4 LV IVS:        0.70 cm  LV e' lateral:   8.05  cm/s LVOT diam:     1.90 cm  LV E/e' lateral: 10.9 LV SV:         70 LV SV Index:   43 LVOT Area:     2.84 cm  RIGHT VENTRICLE            IVC RV S prime:     9.68 cm/s  IVC diam: 2.40 cm TAPSE (M-mode): 1.6 cm LEFT ATRIUM             Index       RIGHT ATRIUM           Index LA diam:        3.80 cm 2.36 cm/m  RA Area:     14.50 cm LA Vol (A2C):   56.0 ml 34.75 ml/m RA Volume:   35.00 ml  21.72 ml/m LA Vol (A4C):   62.4 ml 38.72 ml/m LA Biplane Vol: 62.1 ml 38.54 ml/m  AORTIC VALVE AV Area (Vmax):    1.72 cm AV Area (Vmean):   1.65 cm AV Area (VTI):     1.80 cm AV Vmax:           201.00 cm/s AV Vmean:          131.000 cm/s AV VTI:            0.390 m AV Peak Grad:      16.2 mmHg AV Mean Grad:      8.0 mmHg LVOT Vmax:         122.00 cm/s LVOT Vmean:        76.200 cm/s LVOT VTI:          0.247 m LVOT/AV VTI ratio: 0.63  AORTA Ao Root diam: 2.60 cm Ao Asc diam:  2.50 cm MITRAL VALVE               TRICUSPID VALVE MV Area (PHT): 3.37 cm    TR Peak grad:   54.5 mmHg MV Decel Time: 225 msec    TR Vmax:        369.00 cm/s MV E velocity: 87.40 cm/s MV A velocity: 65.60 cm/s  SHUNTS MV E/A ratio:  1.33        Systemic VTI:  0.25 m                            Systemic Diam: 1.90 cm Oswaldo Milian MD Electronically signed by Oswaldo Milian MD Signature Date/Time: 12/14/2019/7:41:46 PM    Final    Medications: . sodium chloride    . sodium chloride     .  stroke: mapping our early stages of recovery book   Does not apply Once  . ambrisentan  5 mg Oral Daily  . calcium acetate  667 mg Oral TID WC  .  Chlorhexidine Gluconate Cloth  6 each Topical Daily  . Chlorhexidine Gluconate Cloth  6 each Topical Q0600  . cinacalcet  30 mg Oral Q T,Th,Sa-HD  . doxercalciferol  6 mcg Intravenous Q T,Th,Sa-HD  . fluticasone  1 spray Each Nare Daily  . loratadine  10 mg Oral Daily  . pantoprazole  40 mg Oral Daily  . pentoxifylline  400 mg Oral TID WC  . Selexipag  800 mcg Oral Daily  . senna-docusate  1 tablet Oral BID  . tadalafil  20 mg Oral Daily     HD orders: [place] T, Th,S 3.5 hrs 450/600 2.0 K/2.25 Ca 58.5 kg UFP 2  linear Na -No Heparin -Mircera 50 mcg IV q 4 weeks -Hectorol 6 mcg IV TIW -Sensipar 30 mg PO TIW   Summary:64 year old black female with multiple medical issues including ESRD. Presents with right hemiplegia secondary to [place]  Assessment/ Plan: 1ICH-thought secondary to fairly new Coumadin therapy. Coumadin has been held, reversing agents on board, neurology following, to be admitted to ICU for neuro monitoring. Repeat CT of head 12/02 without evidence of extension of hematoma. Plans for CIR for rehab.  2 ESRD:We will continue with TTS schedule via aVF. Next HD today. Minimal ultrafiltration to avoid blood pressure drop 3 Hypertension/Volume-No evidence of volume overload. Need to avoid hypotension during HD. Keep SBP > 110.   HD 12/02 Net UF 1.5 Post wt 56 kg. She is losing body weight. UF 0.5-1 liter tomorrow. Will need lower EDW on DC.  4. Anemia of ESRD:Hgb 11.5. No ESA needed.  5. Metabolic Bone Disease: C Ca 9.86 PO4 elevated. Wrong binder orders (She has tried them all!) Requesting calcium acetate which is fine-as long as she will take binder. Will order.  6.  pulmonary HTN. Follows with cardiology at [facility]. Currently on tadalafil and Uptravi for her pulmonary hypertension.Marland Kitchen Avoid hypotension.   Kelly Splinter, MD 12/15/2019, 4:12 PM

## 2019-12-15 NOTE — Progress Notes (Signed)
PROGRESS NOTE    Katie Nunez  KYH:[id number] DOB: 03/07/1955 DOA: 12/12/2019 PCP: [name], MD    Chief Complaint  Patient presents with  . Extremity Weakness    Right leg and arm    Brief Narrative:   [name] is a 64 y.o. female with history of ESRD on HD TTS, fibromyalgia,hypertension,mixed connective tissue disorder, scleroderma, pulmonary hypertension,atrial fibrillation, cirrhosis-on Coumadin forportal vein thrombosis and atrial fibrillation, presenting with R sided weakness.  She was found to have left frontal intracranial hemorrhage probably secondary to warfarin.   Assessment & Plan:   Active Problems:   ICH (intracerebral hemorrhage) (HCC)   Left frontal ICH due to warfarin coagulopathy: Repeat CT head stable.   echocardiogram showed left ventricular ejection fraction of 65 to 70%, no regional wall abnormalities and diastolic parameters consistent with grade 2 diastolic dysfunction.  She has signed self right ventricular pressure and volume overload.  Right ventricular systolic function is moderately reduced with hypokinesis of the 3 wall and severely elevated pulmonary artery systolic pressure. Neurology recommends holding anti coagulation for 2 weeks.  Therapy eval recommending CIR.    Atrial fib  Rate controlled.  Anti coagulation on hold for the bleed.    Essential hypertension;  SBP goal is <160 mmhg. blood pressure are optimal   Hyperlipidemia:  Hold statin for now in view of active bleed.    ESRD on HD - nephrology on board.    Patient reports postnasal drip, cough and congestion, requesting for antihistamines, nasal spray.   DVT prophylaxis: (SCD'S) Code Status: full code.  Family Communication: None at bedside today Disposition:   Status is: Inpatient  Remains inpatient appropriate because:Unsafe d/c plan   Dispo: The patient is from: Home              Anticipated d/c is to: CIR              Anticipated  d/c date is: 3 days              Patient currently is medically stable to d/c.       Consultants:   Neurology  Nephrology   Procedures:    Antimicrobials: none.    Subjective: Cough with clear phlegm, postnasal drip, acid reflux  Objective: Vitals:   12/15/19 0343 12/15/19 0710 12/15/19 0715 12/15/19 1234  BP: 116/85 120/60 120/60 121/78  Pulse: 70 72 69 78  Resp: 20 18 18 20   Temp: (!) 97.5 F (36.4 C)  97.6 F (36.4 C) (!) 97.5 F (36.4 C)  TempSrc: Axillary  Axillary Axillary  SpO2: 96% 99% 99% 100%  Weight:      Height:        Intake/Output Summary (Last 24 hours) at 12/15/2019 1347 Last data filed at 12/15/2019 0600 Gross per 24 hour  Intake 180 ml  Output --  Net 180 ml   Filed Weights   12/12/19 2112 12/13/19 1730 12/14/19 2230  Weight: 57.4 kg 56 kg 74.3 kg    Examination:  General exam: Alert and comfortable, not in distress Respiratory system: Clear to auscultation bilaterally, no wheezing or rhonchi Cardiovascular system: S1-S2 heard, regular rate, no JVD Gastrointestinal system: Abdomen is soft, nontender, bowel sounds normal Central nervous system: Alert and oriented, no new focal deficits Extremities: No pedal edema.  Skin: No rashes seen Psychiatry: Mood is appropriate.     Data Reviewed: I have personally reviewed following labs and imaging studies  CBC: Recent Labs  Lab 12/12/19 1512  12/12/19 1528 12/13/19 0828  WBC  --  3.3* 3.8*  NEUTROABS  --  1.8  --   HGB 12.2 11.1* 11.5*  HCT 36.0 34.6* 36.7  MCV  --  94.8 94.6  PLT  --  131* 131*    Basic Metabolic Panel: Recent Labs  Lab 12/12/19 1512 12/12/19 1528 12/13/19 1018  NA 141 140 141  K 4.4 4.7 5.1  CL 102 99 100  CO2  --  27 24  GLUCOSE 85 91 78  BUN 47* 42* 51*  CREATININE 6.30* 6.18* 7.48*  CALCIUM  --  8.8* 8.9  PHOS  --   --  7.7*    GFR: Estimated Creatinine Clearance: 8 mL/min (A) (by C-G formula based on SCr of 7.48 mg/dL (H)).  Liver Function  Tests: Recent Labs  Lab 12/12/19 1528 12/13/19 1018  AST 35  --   ALT 25  --   ALKPHOS 152*  --   BILITOT 0.6  --   PROT 7.2  --   ALBUMIN 2.9* 2.8*    CBG: Recent Labs  Lab 12/14/19 1619 12/15/19 0320 12/15/19 0346 12/15/19 0625 12/15/19 1242  GLUCAP 61* 66* 82 84 97     Recent Results (from the past 240 hour(s))  MRSA PCR Screening     Status: None   Collection Time: 12/12/19  9:15 PM   Specimen: Nasal Mucosa; Nasopharyngeal  Result Value Ref Range Status   MRSA by PCR NEGATIVE NEGATIVE Final    Comment:        The GeneXpert MRSA Assay (FDA approved for NASAL specimens only), is one component of a comprehensive MRSA colonization surveillance program. It is not intended to diagnose MRSA infection nor to guide or monitor treatment for MRSA infections. Performed at [facility], [place] [address], [place], [place] [address]   Resp Panel by RT-PCR (Flu A&B, Covid) Nasopharyngeal Swab     Status: None   Collection Time: 12/12/19 11:39 PM   Specimen: Nasopharyngeal Swab; Nasopharyngeal(NP) swabs in vial transport medium  Result Value Ref Range Status   SARS Coronavirus 2 by RT PCR NEGATIVE NEGATIVE Final    Comment: (NOTE) SARS-CoV-2 target nucleic acids are NOT DETECTED.  The SARS-CoV-2 RNA is generally detectable in upper respiratory specimens during the acute phase of infection. The lowest concentration of SARS-CoV-2 viral copies this assay can detect is 138 copies/mL. A negative result does not preclude SARS-Cov-2 infection and should not be used as the sole basis for treatment or other patient management decisions. A negative result may occur with  improper specimen collection/handling, submission of specimen other than nasopharyngeal swab, presence of viral mutation(s) within the areas targeted by this assay, and inadequate number of viral copies(<138 copies/mL). A negative result must be combined with clinical observations, patient history, and  epidemiological information. The expected result is Negative.  Fact Sheet for Patients:  EntrepreneurPulse.com.au  Fact Sheet for Healthcare Providers:  IncredibleEmployment.be  This test is no t yet approved or cleared by the [place] FDA and  has been authorized for detection and/or diagnosis of SARS-CoV-2 by FDA under an Emergency Use Authorization (EUA). This EUA will remain  in effect (meaning this test can be used) for the duration of the COVID-19 declaration under Section 564(b)(1) of the Act, 21 U.S.C.section 360bbb-3(b)(1), unless the authorization is terminated  or revoked sooner.       Influenza A by PCR NEGATIVE NEGATIVE Final   Influenza B by PCR NEGATIVE NEGATIVE Final  Comment: (NOTE) The Xpert Xpress SARS-CoV-2/FLU/RSV plus assay is intended as an aid in the diagnosis of influenza from Nasopharyngeal swab specimens and should not be used as a sole basis for treatment. Nasal washings and aspirates are unacceptable for Xpert Xpress SARS-CoV-2/FLU/RSV testing.  Fact Sheet for Patients: EntrepreneurPulse.com.au  Fact Sheet for Healthcare Providers: IncredibleEmployment.be  This test is not yet approved or cleared by the [place] FDA and has been authorized for detection and/or diagnosis of SARS-CoV-2 by FDA under an Emergency Use Authorization (EUA). This EUA will remain in effect (meaning this test can be used) for the duration of the COVID-19 declaration under Section 564(b)(1) of the Act, 21 U.S.C. section 360bbb-3(b)(1), unless the authorization is terminated or revoked.  Performed at [facility], [place] [facility] [place] [address], [place], [place] [address]          Radiology Studies: ECHOCARDIOGRAM COMPLETE  Result Date: 12/14/2019    ECHOCARDIOGRAM REPORT   Patient Name:   HOORIA GASPARINI Date of Exam: 12/14/2019 Medical Rec #:  [id number]        Height:       65.0 in  Accession #:    [id number]       Weight:       123.5 lb Date of Birth:  1955/12/01       BSA:          1.611 m Patient Age:    87 years         BP:           113/57 mmHg Patient Gender: F                HR:           73 bpm. Exam Location:  Inpatient Procedure: 2D Echo Indications:    stroke 434.91  History:        Patient has prior history of Echocardiogram examinations, most                 recent 10/19/2017. Scleroderma. Raynauds. Pulmonary hypertension.                 Cirrhosis. End stage renal disease.; Arrythmias:Atrial                 Fibrillation.  Sonographer:    Johny Chess Referring Phys: [id number] ASHISH ARORA IMPRESSIONS  1. Left ventricular ejection fraction, by estimation, is 65 to 70%. The left ventricle has normal function. The left ventricle has no regional wall motion abnormalities. There is mild left ventricular hypertrophy. Left ventricular diastolic parameters are consistent with Grade II diastolic dysfunction (pseudonormalization). Elevated left atrial pressure. There is the interventricular septum is flattened in systole and diastole, consistent with right ventricular pressure and volume overload.  2. Right ventricular systolic function is moderately reduced. Hypokinesis of free wall. The right ventricular size is mildly enlarged. There is severely elevated pulmonary artery systolic pressure. The estimated right ventricular systolic pressure is 32.9 mmHg.  3. Left atrial size was mildly dilated.  4. The mitral valve is normal in structure. Trivial mitral valve regurgitation. No evidence of mitral stenosis.  5. Tricuspid valve regurgitation is mild to moderate.  6. The aortic valve is tricuspid. Aortic valve regurgitation is trivial. Mild aortic valve sclerosis is present, with no evidence of aortic valve stenosis.  7. The inferior vena cava is dilated in size with <50% respiratory variability, suggesting right atrial pressure of 15 mmHg. FINDINGS  Left Ventricle: Left ventricular  ejection fraction, by estimation,  is 65 to 70%. The left ventricle has normal function. The left ventricle has no regional wall motion abnormalities. The left ventricular internal cavity size was normal in size. There is  mild left ventricular hypertrophy. The interventricular septum is flattened in systole and diastole, consistent with right ventricular pressure and volume overload. Left ventricular diastolic parameters are consistent with Grade II diastolic dysfunction  (pseudonormalization). Elevated left atrial pressure. Right Ventricle: The right ventricular size is mildly enlarged. Right vetricular wall thickness was not assessed. Right ventricular systolic function is moderately reduced. There is severely elevated pulmonary artery systolic pressure. The tricuspid regurgitant velocity is 3.69 m/s, and with an assumed right atrial pressure of 15 mmHg, the estimated right ventricular systolic pressure is 01.7 mmHg. Left Atrium: Left atrial size was mildly dilated. Right Atrium: Right atrial size was normal in size. Pericardium: There is no evidence of pericardial effusion. Mitral Valve: The mitral valve is normal in structure. Trivial mitral valve regurgitation. No evidence of mitral valve stenosis. Tricuspid Valve: The tricuspid valve is normal in structure. Tricuspid valve regurgitation is mild to moderate. Aortic Valve: The aortic valve is tricuspid. Aortic valve regurgitation is trivial. Mild aortic valve sclerosis is present, with no evidence of aortic valve stenosis. Aortic valve mean gradient measures 8.0 mmHg. Aortic valve peak gradient measures 16.2 mmHg. Aortic valve area, by VTI measures 1.80 cm. Pulmonic Valve: The pulmonic valve was grossly normal. Pulmonic valve regurgitation is mild. Aorta: The aortic root and ascending aorta are structurally normal, with no evidence of dilitation. Venous: The inferior vena cava is dilated in size with less than 50% respiratory variability, suggesting right  atrial pressure of 15 mmHg. IAS/Shunts: The interatrial septum was not well visualized.  LEFT VENTRICLE PLAX 2D LVIDd:         4.20 cm  Diastology LVIDs:         2.30 cm  LV e' medial:    6.09 cm/s LV PW:         0.90 cm  LV E/e' medial:  14.4 LV IVS:        0.70 cm  LV e' lateral:   8.05 cm/s LVOT diam:     1.90 cm  LV E/e' lateral: 10.9 LV SV:         70 LV SV Index:   43 LVOT Area:     2.84 cm  RIGHT VENTRICLE            IVC RV S prime:     9.68 cm/s  IVC diam: 2.40 cm TAPSE (M-mode): 1.6 cm LEFT ATRIUM             Index       RIGHT ATRIUM           Index LA diam:        3.80 cm 2.36 cm/m  RA Area:     14.50 cm LA Vol (A2C):   56.0 ml 34.75 ml/m RA Volume:   35.00 ml  21.72 ml/m LA Vol (A4C):   62.4 ml 38.72 ml/m LA Biplane Vol: 62.1 ml 38.54 ml/m  AORTIC VALVE AV Area (Vmax):    1.72 cm AV Area (Vmean):   1.65 cm AV Area (VTI):     1.80 cm AV Vmax:           201.00 cm/s AV Vmean:          131.000 cm/s AV VTI:            0.390 m AV Peak  Grad:      16.2 mmHg AV Mean Grad:      8.0 mmHg LVOT Vmax:         122.00 cm/s LVOT Vmean:        76.200 cm/s LVOT VTI:          0.247 m LVOT/AV VTI ratio: 0.63  AORTA Ao Root diam: 2.60 cm Ao Asc diam:  2.50 cm MITRAL VALVE               TRICUSPID VALVE MV Area (PHT): 3.37 cm    TR Peak grad:   54.5 mmHg MV Decel Time: 225 msec    TR Vmax:        369.00 cm/s MV E velocity: 87.40 cm/s MV A velocity: 65.60 cm/s  SHUNTS MV E/A ratio:  1.33        Systemic VTI:  0.25 m                            Systemic Diam: 1.90 cm Oswaldo Milian MD Electronically signed by Oswaldo Milian MD Signature Date/Time: 12/14/2019/7:41:46 PM    Final         Scheduled Meds: .  stroke: mapping our early stages of recovery book   Does not apply Once  . ambrisentan  5 mg Oral Daily  . calcium acetate  667 mg Oral TID WC  . Chlorhexidine Gluconate Cloth  6 each Topical Daily  . Chlorhexidine Gluconate Cloth  6 each Topical Q0600  . cinacalcet  30 mg Oral Q T,Th,Sa-HD  .  doxercalciferol  6 mcg Intravenous Q T,Th,Sa-HD  . pentoxifylline  400 mg Oral TID WC  . Selexipag  800 mcg Oral Daily  . senna-docusate  1 tablet Oral BID  . tadalafil  20 mg Oral Daily   Continuous Infusions: . sodium chloride    . sodium chloride       LOS: 3 days        Hosie Poisson, MD [facility]   To contact the attending provider between 7A-7P or the covering provider during after hours 7P-7A, please log into the web site [email or web address] and access using universal [place] password for that web site. If you do not have the password, please call the hospital operator.  12/15/2019, 1:47 PM

## 2019-12-16 DIAGNOSIS — L02419 Cutaneous abscess of limb, unspecified: Secondary | ICD-10-CM | POA: Insufficient documentation

## 2019-12-16 LAB — GLUCOSE, CAPILLARY
Glucose-Capillary: 64 mg/dL — ABNORMAL LOW (ref 70–99)
Glucose-Capillary: 81 mg/dL (ref 70–99)
Glucose-Capillary: 52 mg/dL — ABNORMAL LOW (ref 70–99)
Glucose-Capillary: 101 mg/dL — ABNORMAL HIGH (ref 70–99)
Glucose-Capillary: 35 mg/dL — CL (ref 70–99)
Glucose-Capillary: 119 mg/dL — ABNORMAL HIGH (ref 70–99)
Glucose-Capillary: 87 mg/dL (ref 70–99)

## 2019-12-16 LAB — BASIC METABOLIC PANEL
Anion gap: 13 (ref 5–15)
BUN: 26 mg/dL — ABNORMAL HIGH (ref 8–23)
CO2: 28 mmol/L (ref 22–32)
Calcium: 8.5 mg/dL — ABNORMAL LOW (ref 8.9–10.3)
Chloride: 93 mmol/L — ABNORMAL LOW (ref 98–111)
Creatinine, Ser: 4.78 mg/dL — ABNORMAL HIGH (ref 0.44–1.00)
GFR, Estimated: 10 mL/min — ABNORMAL LOW (ref >60)
Glucose, Bld: 178 mg/dL — ABNORMAL HIGH (ref 70–99)
Potassium: 3.4 mmol/L — ABNORMAL LOW (ref 3.5–5.1)
Sodium: 134 mmol/L — ABNORMAL LOW (ref 135–145)

## 2019-12-16 MED ORDER — BISACODYL 5 MG PO TBEC
10.0000 mg | DELAYED_RELEASE_TABLET | Freq: Once | ORAL | Status: AC
  Administered 2019-12-16: 10 mg via ORAL
  Filled 2019-12-16: qty 2

## 2019-12-16 MED ORDER — DEXTROSE 50 % IV SOLN
INTRAVENOUS | Status: AC
  Administered 2019-12-16: 50 mL
  Filled 2019-12-16: qty 50

## 2019-12-16 NOTE — Progress Notes (Signed)
PROGRESS NOTE    Katie Nunez  DGL:[id number] DOB: 1955/09/06 DOA: 12/12/2019 PCP: [name], MD    Chief Complaint  Patient presents with  . Extremity Weakness    Right leg and arm    Brief Narrative:   Katie Nunez is a 64 y.o. female with history of ESRD on HD TTS, fibromyalgia,hypertension,mixed connective tissue disorder, scleroderma, pulmonary hypertension,atrial fibrillation, cirrhosis-on Coumadin forportal vein thrombosis and atrial fibrillation, presenting with R sided weakness.  She was found to have left frontal intracranial hemorrhage probably secondary to warfarin. Patient seen and examined today no new complaints except for constipation. RN reports that patient had few episodes of hypoglycemia despite eating breakfast and lunch and snacks.  BMP is being ordered.  Assessment & Plan:   Active Problems:   ICH (intracerebral hemorrhage) (HCC)   Left frontal ICH due to warfarin coagulopathy: Repeat CT head stable.   echocardiogram showed left ventricular ejection fraction of 65 to 70%, no regional wall abnormalities and diastolic parameters consistent with grade 2 diastolic dysfunction.  She has signed self right ventricular pressure and volume overload.  Right ventricular systolic function is moderately reduced with hypokinesis of the 3 wall and severely elevated pulmonary artery systolic pressure. Neurology recommends holding anti coagulation for 2 weeks.  Therapy eval recommending CIR. currently waiting for CIR placement   Atrial fib  Rate controlled.  Anti coagulation on hold for the bleed for at least 2 weeks as per neurology   Essential hypertension;  SBP goal is <160 mmhg. blood pressure parameters are optimal   Hyperlipidemia:  Hold statin for now in view of active bleed.    ESRD on HD - nephrology on board.  Dialysis as per nephrology.   Patient reports postnasal drip, cough and congestion, requesting for antihistamines, nasal  spray. Chest x-ray is negative for any acute pulmonary abnormalities.    Constipation Patient on senna and suppository.   Hypoglycemia Unclear etiology patient has had breakfast and lunch and is eating snacks and her sugars have been 64 to as low as 29 all day today.  Check BMP today. Patient is asymptomatic at this time.   Liver cirrhosis  patient reports she has a history of liver cirrhosis and wants to get a CT of the abdomen and pelvis and she was scheduled to get it done next week.  DVT prophylaxis: (SCD'S) Code Status: full code.  Family Communication: None at bedside today Disposition:   Status is: Inpatient  Remains inpatient appropriate because:Unsafe d/c plan   Dispo: The patient is from: Home              Anticipated d/c is to: CIR              Anticipated d/c date is: 3 days              Patient currently is medically stable to d/c.       Consultants:   Neurology  Nephrology   Procedures:    Antimicrobials: none.    Subjective: Patient denies any cough at this time  Objective: Vitals:   12/16/19 0505 12/16/19 0827 12/16/19 0829 12/16/19 1216  BP: 116/60 (!) 119/58  124/73  Pulse: 80 82  79  Resp: 16 20  20   Temp: 97.9 F (36.6 C) 97.7 F (36.5 C)  97.8 F (36.6 C)  TempSrc: Oral Oral  Oral  SpO2: 97% 94% 98% 100%  Weight:      Height:  Intake/Output Summary (Last 24 hours) at 12/16/2019 1638 Last data filed at 12/16/2019 0034 Gross per 24 hour  Intake --  Output 1000 ml  Net -1000 ml   Filed Weights   12/12/19 2112 12/13/19 1730 12/14/19 2230  Weight: 57.4 kg 56 kg 74.3 kg    Examination:  General exam alert and comfortable not in distress Respiratory system: Clear to Auscultation Bilaterally, No Wheezing or Rhonchi Cardiovascular system: S1-S2 heard, regular rate rhythm, no JVD Gastrointestinal system: Abdomen is soft, nontender, nondistended, bowel sounds normal Central nervous system: Alert and oriented, grossly  nonfocal Extremities: No pedal edema Skin: No rashes seen Psychiatry: Mood is appropriate.     Data Reviewed: I have personally reviewed following labs and imaging studies  CBC: Recent Labs  Lab 12/12/19 1512 12/12/19 1528 12/13/19 0828  WBC  --  3.3* 3.8*  NEUTROABS  --  1.8  --   HGB 12.2 11.1* 11.5*  HCT 36.0 34.6* 36.7  MCV  --  94.8 94.6  PLT  --  131* 131*    Basic Metabolic Panel: Recent Labs  Lab 12/12/19 1512 12/12/19 1528 12/13/19 1018  NA 141 140 141  K 4.4 4.7 5.1  CL 102 99 100  CO2  --  27 24  GLUCOSE 85 91 78  BUN 47* 42* 51*  CREATININE 6.30* 6.18* 7.48*  CALCIUM  --  8.8* 8.9  PHOS  --   --  7.7*    GFR: Estimated Creatinine Clearance: 8 mL/min (A) (by C-G formula based on SCr of 7.48 mg/dL (H)).  Liver Function Tests: Recent Labs  Lab 12/12/19 1528 12/13/19 1018  AST 35  --   ALT 25  --   ALKPHOS 152*  --   BILITOT 0.6  --   PROT 7.2  --   ALBUMIN 2.9* 2.8*    CBG: Recent Labs  Lab 12/16/19 0622 12/16/19 1324 12/16/19 1403 12/16/19 1405 12/16/19 1425  GLUCAP 81 52* 35* 29* 101*     Recent Results (from the past 240 hour(s))  MRSA PCR Screening     Status: None   Collection Time: 12/12/19  9:15 PM   Specimen: Nasal Mucosa; Nasopharyngeal  Result Value Ref Range Status   MRSA by PCR NEGATIVE NEGATIVE Final    Comment:        The GeneXpert MRSA Assay (FDA approved for NASAL specimens only), is one component of a comprehensive MRSA colonization surveillance program. It is not intended to diagnose MRSA infection nor to guide or monitor treatment for MRSA infections. Performed at [facility], [place] [address], [place],  [place] [address]   Resp Panel by RT-PCR (Flu A&B, Covid) Nasopharyngeal Swab     Status: None   Collection Time: 12/12/19 11:39 PM   Specimen: Nasopharyngeal Swab; Nasopharyngeal(NP) swabs in vial transport medium  Result Value Ref Range Status   SARS Coronavirus 2 by RT PCR NEGATIVE NEGATIVE  Final    Comment: (NOTE) SARS-CoV-2 target nucleic acids are NOT DETECTED.  The SARS-CoV-2 RNA is generally detectable in upper respiratory specimens during the acute phase of infection. The lowest concentration of SARS-CoV-2 viral copies this assay can detect is 138 copies/mL. A negative result does not preclude SARS-Cov-2 infection and should not be used as the sole basis for treatment or other patient management decisions. A negative result may occur with  improper specimen collection/handling, submission of specimen other than nasopharyngeal swab, presence of viral mutation(s) within the areas targeted by this assay, and inadequate number  of viral copies(<138 copies/mL). A negative result must be combined with clinical observations, patient history, and epidemiological information. The expected result is Negative.  Fact Sheet for Patients:  EntrepreneurPulse.com.au  Fact Sheet for Healthcare Providers:  IncredibleEmployment.be  This test is no t yet approved or cleared by the [place] FDA and  has been authorized for detection and/or diagnosis of SARS-CoV-2 by FDA under an Emergency Use Authorization (EUA). This EUA will remain  in effect (meaning this test can be used) for the duration of the COVID-19 declaration under Section 564(b)(1) of the Act, 21 U.S.C.section 360bbb-3(b)(1), unless the authorization is terminated  or revoked sooner.       Influenza A by PCR NEGATIVE NEGATIVE Final   Influenza B by PCR NEGATIVE NEGATIVE Final    Comment: (NOTE) The Xpert Xpress SARS-CoV-2/FLU/RSV plus assay is intended as an aid in the diagnosis of influenza from Nasopharyngeal swab specimens and should not be used as a sole basis for treatment. Nasal washings and aspirates are unacceptable for Xpert Xpress SARS-CoV-2/FLU/RSV testing.  Fact Sheet for Patients: EntrepreneurPulse.com.au  Fact Sheet for Healthcare  Providers: IncredibleEmployment.be  This test is not yet approved or cleared by the [place] FDA and has been authorized for detection and/or diagnosis of SARS-CoV-2 by FDA under an Emergency Use Authorization (EUA). This EUA will remain in effect (meaning this test can be used) for the duration of the COVID-19 declaration under Section 564(b)(1) of the Act, 21 U.S.C. section 360bbb-3(b)(1), unless the authorization is terminated or revoked.  Performed at [facility], [place] [address] [place], [place] [address]          Radiology Studies: DG CHEST PORT 1 VIEW  Result Date: 12/15/2019 CLINICAL DATA:  Cough and congestion. EXAM: PORTABLE CHEST 1 VIEW COMPARISON:  07/27/2018 FINDINGS: Mild enlargement of the cardiopericardial silhouette. No mediastinal or hilar masses. Lungs are hyperexpanded. Mild chronic interstitial thickening. No areas of lung consolidation. No evidence of pulmonary edema. No pleural effusion or pneumothorax. Skeletal structures are grossly intact. IMPRESSION: No acute cardiopulmonary disease. Electronically Signed   By: Lajean Manes M.D.   On: 12/15/2019 17:43   ECHOCARDIOGRAM COMPLETE  Result Date: 12/14/2019    ECHOCARDIOGRAM REPORT   Patient Name:   JEANNI ALLSHOUSE Date of Exam: 12/14/2019 Medical Rec #:  [id number]        Height:       65.0 in Accession #:    [id number]       Weight:       123.5 lb Date of Birth:  [birth year]       BSA:          1.611 m Patient Age:    89 years         BP:           113/57 mmHg Patient Gender: F                HR:           73 bpm. Exam Location:  Inpatient Procedure: 2D Echo Indications:    stroke 434.91  History:        Patient has prior history of Echocardiogram examinations, most                 recent 10/19/2017. Scleroderma. Raynauds. Pulmonary hypertension.                 Cirrhosis. End stage renal disease.; Arrythmias:Atrial  Fibrillation.  Sonographer:    Johny Chess  Referring Phys: [id number] ASHISH ARORA IMPRESSIONS  1. Left ventricular ejection fraction, by estimation, is 65 to 70%. The left ventricle has normal function. The left ventricle has no regional wall motion abnormalities. There is mild left ventricular hypertrophy. Left ventricular diastolic parameters are consistent with Grade II diastolic dysfunction (pseudonormalization). Elevated left atrial pressure. There is the interventricular septum is flattened in systole and diastole, consistent with right ventricular pressure and volume overload.  2. Right ventricular systolic function is moderately reduced. Hypokinesis of free wall. The right ventricular size is mildly enlarged. There is severely elevated pulmonary artery systolic pressure. The estimated right ventricular systolic pressure is 77.9 mmHg.  3. Left atrial size was mildly dilated.  4. The mitral valve is normal in structure. Trivial mitral valve regurgitation. No evidence of mitral stenosis.  5. Tricuspid valve regurgitation is mild to moderate.  6. The aortic valve is tricuspid. Aortic valve regurgitation is trivial. Mild aortic valve sclerosis is present, with no evidence of aortic valve stenosis.  7. The inferior vena cava is dilated in size with <50% respiratory variability, suggesting right atrial pressure of 15 mmHg. FINDINGS  Left Ventricle: Left ventricular ejection fraction, by estimation, is 65 to 70%. The left ventricle has normal function. The left ventricle has no regional wall motion abnormalities. The left ventricular internal cavity size was normal in size. There is  mild left ventricular hypertrophy. The interventricular septum is flattened in systole and diastole, consistent with right ventricular pressure and volume overload. Left ventricular diastolic parameters are consistent with Grade II diastolic dysfunction  (pseudonormalization). Elevated left atrial pressure. Right Ventricle: The right ventricular size is mildly enlarged. Right  vetricular wall thickness was not assessed. Right ventricular systolic function is moderately reduced. There is severely elevated pulmonary artery systolic pressure. The tricuspid regurgitant velocity is 3.69 m/s, and with an assumed right atrial pressure of 15 mmHg, the estimated right ventricular systolic pressure is 39.0 mmHg. Left Atrium: Left atrial size was mildly dilated. Right Atrium: Right atrial size was normal in size. Pericardium: There is no evidence of pericardial effusion. Mitral Valve: The mitral valve is normal in structure. Trivial mitral valve regurgitation. No evidence of mitral valve stenosis. Tricuspid Valve: The tricuspid valve is normal in structure. Tricuspid valve regurgitation is mild to moderate. Aortic Valve: The aortic valve is tricuspid. Aortic valve regurgitation is trivial. Mild aortic valve sclerosis is present, with no evidence of aortic valve stenosis. Aortic valve mean gradient measures 8.0 mmHg. Aortic valve peak gradient measures 16.2 mmHg. Aortic valve area, by VTI measures 1.80 cm. Pulmonic Valve: The pulmonic valve was grossly normal. Pulmonic valve regurgitation is mild. Aorta: The aortic root and ascending aorta are structurally normal, with no evidence of dilitation. Venous: The inferior vena cava is dilated in size with less than 50% respiratory variability, suggesting right atrial pressure of 15 mmHg. IAS/Shunts: The interatrial septum was not well visualized.  LEFT VENTRICLE PLAX 2D LVIDd:         4.20 cm  Diastology LVIDs:         2.30 cm  LV e' medial:    6.09 cm/s LV PW:         0.90 cm  LV E/e' medial:  14.4 LV IVS:        0.70 cm  LV e' lateral:   8.05 cm/s LVOT diam:     1.90 cm  LV E/e' lateral: 10.9 LV SV:  70 LV SV Index:   43 LVOT Area:     2.84 cm  RIGHT VENTRICLE            IVC RV S prime:     9.68 cm/s  IVC diam: 2.40 cm TAPSE (M-mode): 1.6 cm LEFT ATRIUM             Index       RIGHT ATRIUM           Index LA diam:        3.80 cm 2.36 cm/m  RA  Area:     14.50 cm LA Vol (A2C):   56.0 ml 34.75 ml/m RA Volume:   35.00 ml  21.72 ml/m LA Vol (A4C):   62.4 ml 38.72 ml/m LA Biplane Vol: 62.1 ml 38.54 ml/m  AORTIC VALVE AV Area (Vmax):    1.72 cm AV Area (Vmean):   1.65 cm AV Area (VTI):     1.80 cm AV Vmax:           201.00 cm/s AV Vmean:          131.000 cm/s AV VTI:            0.390 m AV Peak Grad:      16.2 mmHg AV Mean Grad:      8.0 mmHg LVOT Vmax:         122.00 cm/s LVOT Vmean:        76.200 cm/s LVOT VTI:          0.247 m LVOT/AV VTI ratio: 0.63  AORTA Ao Root diam: 2.60 cm Ao Asc diam:  2.50 cm MITRAL VALVE               TRICUSPID VALVE MV Area (PHT): 3.37 cm    TR Peak grad:   54.5 mmHg MV Decel Time: 225 msec    TR Vmax:        369.00 cm/s MV E velocity: 87.40 cm/s MV A velocity: 65.60 cm/s  SHUNTS MV E/A ratio:  1.33        Systemic VTI:  0.25 m                            Systemic Diam: 1.90 cm Oswaldo Milian MD Electronically signed by Oswaldo Milian MD Signature Date/Time: 12/14/2019/7:41:46 PM    Final         Scheduled Meds: .  stroke: mapping our early stages of recovery book   Does not apply Once  . ambrisentan  5 mg Oral Daily  . calcium acetate  667 mg Oral TID WC  . Chlorhexidine Gluconate Cloth  6 each Topical Daily  . Chlorhexidine Gluconate Cloth  6 each Topical Q0600  . cinacalcet  30 mg Oral Q T,Th,Sa-HD  . doxercalciferol  6 mcg Intravenous Q T,Th,Sa-HD  . fluticasone  1 spray Each Nare Daily  . loratadine  10 mg Oral Daily  . pantoprazole  40 mg Oral Daily  . pentoxifylline  400 mg Oral TID WC  . Selexipag  800 mcg Oral Daily  . senna-docusate  1 tablet Oral BID  . tadalafil  20 mg Oral Daily   Continuous Infusions: . sodium chloride    . sodium chloride       LOS: 4 days        Hosie Poisson, MD [facility]   To contact the attending provider between 7A-7P or the covering provider during after hours  7P-7A, please log into the web site [email or web address] and access using  universal [place] password for that web site. If you do not have the password, please call the hospital operator.  12/16/2019, 4:38 PM

## 2019-12-16 NOTE — Progress Notes (Addendum)
Katie Nunez Progress Note   Subjective: Seen in room, no c/o today.   Objective Vitals:   12/16/19 0505 12/16/19 0827 12/16/19 0829 12/16/19 1216  BP: 116/60 (!) 119/58  124/73  Pulse: 80 82  79  Resp: 16 20  20   Temp: 97.9 F (36.6 C) 97.7 F (36.5 C)  97.8 F (36.6 C)  TempSrc: Oral Oral  Oral  SpO2: 97% 94% 98% 100%  Weight:      Height:       Physical Exam General: Ill appearing older female in NAD Heart: S1,S2 SR on monitor. No M/R/G Lungs: CTAB A/T Abdomen: active BS Extremities: No LE edema. Lesion on LLE mid calf has been following with [facility]. Nontender to palpation.   Dialysis Access: L AVF + bruit.    Additional Objective Labs: Basic Metabolic Panel: Recent Labs  Lab 12/12/19 1512 12/12/19 1528 12/13/19 1018  NA 141 140 141  K 4.4 4.7 5.1  CL 102 99 100  CO2  --  27 24  GLUCOSE 85 91 78  BUN 47* 42* 51*  CREATININE 6.30* 6.18* 7.48*  CALCIUM  --  8.8* 8.9  PHOS  --   --  7.7*   Liver Function Tests: Recent Labs  Lab 12/12/19 1528 12/13/19 1018  AST 35  --   ALT 25  --   ALKPHOS 152*  --   BILITOT 0.6  --   PROT 7.2  --   ALBUMIN 2.9* 2.8*   No results for input(s): LIPASE, AMYLASE in the last 168 hours. CBC: Recent Labs  Lab 12/12/19 1512 12/12/19 1528 12/13/19 0828  WBC  --  3.3* 3.8*  NEUTROABS  --  1.8  --   HGB 12.2 11.1* 11.5*  HCT 36.0 34.6* 36.7  MCV  --  94.8 94.6  PLT  --  131* 131*   Medications: . sodium chloride    . sodium chloride     .  stroke: mapping our early stages of recovery book   Does not apply Once  . ambrisentan  5 mg Oral Daily  . calcium acetate  667 mg Oral TID WC  . Chlorhexidine Gluconate Cloth  6 each Topical Daily  . Chlorhexidine Gluconate Cloth  6 each Topical Q0600  . cinacalcet  30 mg Oral Q T,Th,Sa-HD  . doxercalciferol  6 mcg Intravenous Q T,Th,Sa-HD  . fluticasone  1 spray Each Nare Daily  . loratadine  10 mg Oral Daily  . pantoprazole  40 mg Oral Daily  .  pentoxifylline  400 mg Oral TID WC  . Selexipag  800 mcg Oral Daily  . senna-docusate  1 tablet Oral BID  . tadalafil  20 mg Oral Daily     HD orders: [facility] T, Th,S 3.5 hrs 450/600 2.0 K/2.25 Ca 58.5 kg UFP 2 linear Na  L AVF -No Heparin -Mircera 50 mcg IV q 4 weeks -Hectorol 6 mcg IV TIW -Sensipar 30 mg PO TIW   Summary:64 year old black female with multiple medical issues including ESRD. Presents with right hemiplegia secondary to Volin  Assessment/ Plan: 1ICH-thought secondary to fairly new Coumadin therapy. Coumadin was held, reversing agents given, neurology managing. Repeat CT of head 12/02 without evidence of extension of hematoma. Plans for CIR for rehab.  2 ESRD:We will continue with TTS schedule. HD Tuesday.  3 Hypertension/Volume- No evidence of volume overload. 1-2 kg under dry. UF as tol next HD.  4. Anemia of ESRD:Hgb 11.5. No ESA needed.  5. Metabolic Bone  Disease: C Ca 9.86 PO4 elevated. Wrong binder orders (She has tried them all!) Requesting calcium acetate which is fine-as long as she will take binder. Will order.  6.  pulmonary HTN. Follows with cardiology at [facility]. Currently on tadalafil and Uptravi for her pulmonary hypertension.Marland Kitchen Avoid hypotension.   Kelly Splinter, MD 12/16/2019, 4:16 PM

## 2019-12-17 LAB — GLUCOSE, CAPILLARY
Glucose-Capillary: 29 mg/dL — CL (ref 70–99)
Glucose-Capillary: 12 mg/dL — CL (ref 70–99)
Glucose-Capillary: 32 mg/dL — CL (ref 70–99)
Glucose-Capillary: 11 mg/dL — CL (ref 70–99)
Glucose-Capillary: 81 mg/dL (ref 70–99)
Glucose-Capillary: 109 mg/dL — ABNORMAL HIGH (ref 70–99)

## 2019-12-17 MED ORDER — CALCIUM ACETATE (PHOS BINDER) 667 MG PO CAPS
1334.0000 mg | ORAL_CAPSULE | Freq: Three times a day (TID) | ORAL | Status: DC
  Administered 2019-12-17 – 2019-12-18 (×3): 1334 mg via ORAL
  Filled 2019-12-17 (×3): qty 2

## 2019-12-17 MED ORDER — SELEXIPAG 400 MCG PO TABS
800.0000 ug | ORAL_TABLET | Freq: Two times a day (BID) | ORAL | Status: DC
  Administered 2019-12-17 – 2019-12-18 (×3): 800 ug via ORAL
  Filled 2019-12-17 (×2): qty 2

## 2019-12-17 MED ORDER — SELEXIPAG 400 MCG PO TABS: 800.0000 ug | ORAL_TABLET | Freq: Two times a day (BID) | ORAL | Status: DC

## 2019-12-17 MED ORDER — CHLORHEXIDINE GLUCONATE CLOTH 2 % EX PADS
6.0000 | MEDICATED_PAD | Freq: Every day | CUTANEOUS | Status: DC
  Administered 2019-12-18: 6 via TOPICAL

## 2019-12-17 MED ORDER — PENTOXIFYLLINE ER 400 MG PO TBCR
400.0000 mg | EXTENDED_RELEASE_TABLET | Freq: Every day | ORAL | Status: DC
  Administered 2019-12-18: 400 mg via ORAL
  Filled 2019-12-17: qty 1

## 2019-12-17 MED ORDER — HYDROCORTISONE (PERIANAL) 2.5 % EX CREA
TOPICAL_CREAM | Freq: Two times a day (BID) | CUTANEOUS | Status: DC | PRN
  Filled 2019-12-17: qty 28.35

## 2019-12-17 NOTE — Progress Notes (Signed)
Physical Therapy Treatment Patient Details Name: Katie Nunez MRN: [id number] DOB: April 23, 1955 Today's Date: 12/17/2019    History of Present Illness The pt is a 64 yo female presenting with acute R-sided weakness, found to have L frontal ICH. PMH includes: anxiety, ESRD on HD TTS, HTN, IBS, RA, fibromyalgia, a fib on Coumadin.     PT Comments    Pt was fatigued from recent OT session but agreed to PT exercises.  Obtained a dry erase marker for her as pt is expecting to use board to practice with R hand on handwriting.  Follow up with assistance to use R side, resistance on L side LE and work on standing balance, endurance and carry over of exercises to support gait efforts as able.  Pt is motivated, and will expect her to accomplish progress in CIR due to quality effort.  Encourage OOB in chair and use of trunk to support mobility in chair.    Follow Up Recommendations  CIR     Equipment Recommendations  Other (comment)    Recommendations for Other Services Rehab consult     Precautions / Restrictions Precautions Precautions: Fall Precaution Comments: Rt hemiplegia  Restrictions Weight Bearing Restrictions: No    Mobility  Bed Mobility Overal bed mobility: Needs Assistance Bed Mobility: Supine to Sit     Supine to sit: Mod assist     General bed mobility comments: up in chair  Transfers Overall transfer level: Needs assistance Equipment used: Ambulation equipment used Transfers: Sit to/from Stand Sit to Stand: Mod assist         General transfer comment: in chair and fatigued to restand  Ambulation/Gait                 Stairs             Wheelchair Mobility    Modified Rankin (Stroke Patients Only)       Balance Overall balance assessment: Needs assistance Sitting-balance support: Feet supported Sitting balance-Leahy Scale: Poor Sitting balance - Comments: reliant on UE support    Standing balance support: Single extremity  supported;Bilateral upper extremity supported Standing balance-Leahy Scale: Poor Standing balance comment: external assist and UE support                             Cognition Arousal/Alertness: Awake/alert Behavior During Therapy: WFL for tasks assessed/performed Overall Cognitive Status: Impaired/Different from baseline Area of Impairment: Problem solving;Safety/judgement;Following commands;Memory;Attention;Orientation;Awareness                 Orientation Level: Situation Current Attention Level: Selective Memory: Decreased short-term memory Following Commands: Follows one step commands inconsistently;Follows one step commands with increased time Safety/Judgement: Decreased awareness of safety;Decreased awareness of deficits Awareness: Intellectual Problem Solving: Slow processing;Difficulty sequencing;Requires verbal cues;Requires tactile cues General Comments: fatigue of R side impedes following commands to move R LE      Exercises General Exercises - Lower Extremity Ankle Circles/Pumps: PROM;AAROM;5 reps Quad Sets: PROM;AROM;10 reps Heel Slides: AAROM;PROM;Strengthening;10 reps Hip ABduction/ADduction: AROM;AAROM;PROM;10 reps Straight Leg Raises: AAROM;Strengthening;10 reps Hip Flexion/Marching: PROM;AAROM;10 reps    General Comments General comments (skin integrity, edema, etc.): assisting RLE to move but having some scattered contractions of mm's with AAROM      Pertinent Vitals/Pain Pain Assessment: No/denies pain    Home Living                      Prior Function  PT Goals (current goals can now be found in the care plan section) Acute Rehab PT Goals Patient Stated Goal: walking and using R hand to write    Frequency    Min 4X/week      PT Plan Current plan remains appropriate    Co-evaluation              AM-PAC PT "6 Clicks" Mobility   Outcome Measure  Help needed turning from your back to your side  while in a flat bed without using bedrails?: A Lot Help needed moving from lying on your back to sitting on the side of a flat bed without using bedrails?: A Lot Help needed moving to and from a bed to a chair (including a wheelchair)?: A Lot Help needed standing up from a chair using your arms (e.g., wheelchair or bedside chair)?: A Lot Help needed to walk in hospital room?: A Lot Help needed climbing 3-5 steps with a railing? : Total 6 Click Score: 11    End of Session Equipment Utilized During Treatment: Gait belt Activity Tolerance: Patient limited by fatigue Patient left: in chair;with call bell/phone within reach;with chair alarm set;with family/visitor present;with nursing/sitter in room Nurse Communication: Mobility status PT Visit Diagnosis: Other abnormalities of gait and mobility (R26.89);Muscle weakness (generalized) (M62.81);Hemiplegia and hemiparesis Hemiplegia - Right/Left: Right Hemiplegia - dominant/non-dominant: Dominant Hemiplegia - caused by: Other Nontraumatic intracranial hemorrhage     Time: 5364-6803 PT Time Calculation (min) (ACUTE ONLY): 28 min  Charges:  $Therapeutic Exercise: 23-37 mins                    Ramond Dial 12/17/2019, 5:30 PM  Mee Hives, PT MS Acute Rehab Dept. Number: [place] and [place]

## 2019-12-17 NOTE — Progress Notes (Signed)
PROGRESS NOTE    Katie Nunez  GDJ:[id number] DOB: 1955-05-05 DOA: 12/12/2019 PCP: [name], MD    Chief Complaint  Patient presents with  . Extremity Weakness    Right leg and arm    Brief Narrative:   Katie Nunez is a 64 y.o. female with history of ESRD on HD TTS, fibromyalgia,hypertension,mixed connective tissue disorder, scleroderma, pulmonary hypertension,atrial fibrillation, cirrhosis-on Coumadin forportal vein thrombosis and atrial fibrillation, presenting with R sided weakness.  She was found to have left frontal intracranial hemorrhage probably secondary to warfarin. She was transferred from neurology service to [facility].  Therapy evaluations are recommending CIR. CIR on board and will get back to [place] tomorrow.  Meanwhile she currently denies any new complaints other than constipation.   Assessment & Plan:   Active Problems:   Scleroderma ([place])   Raynaud's disease   ESRD (end stage renal disease) ([place])   Pulmonary hypertension (HCC)   Hereditary and idiopathic peripheral neuropathy   Hypothyroidism   Hypokalemia   Other cirrhosis of liver (HCC)   ICH (intracerebral hemorrhage) (HCC)   Left frontal ICH due to warfarin coagulopathy: Repeat CT head stable.  Echocardiogram showed left ventricular ejection fraction of 65 to 70%, no regional wall abnormalities and diastolic parameters consistent with grade 2 diastolic dysfunction.  She has signed self right ventricular pressure and volume overload.  Right ventricular systolic function is moderately reduced with hypokinesis of the 3 wall and severely elevated pulmonary artery systolic pressure. Neurology recommends holding anti coagulation for 2 weeks.  Therapy eval recommending CIR. currently waiting for CIR placement   Atrial fibrillation Rate controlled.  Anti coagulation on hold for the bleed for at least 2 weeks as per neurology   Essential hypertension;  SBP goal is <160 mmhg. blood pressure  parameters are optimal   Hyperlipidemia:  Hold statin for now in view of active bleed.    ESRD on HD - nephrology on board.  Dialysis as per nephrology.  Pulmonary Hypertension:  Resume Letairis and Selexipag and cialis .    Patient reports postnasal drip, cough and congestion, requesting for antihistamines, nasal spray. Chest x-ray is negative for any acute pulmonary abnormalities.  Scleroderma/ Raynaud's disease: Resume hom emeds.   Constipation Patient on senna and suppository.   Hypoglycemia Probably incorrect cbgs from Raynaud's disease. Bmp shows adequate cbgs.     Mild thrombocytopenia:  Pt has a h/o of IT, continue to monitor platelet counts.   Liver cirrhosis  patient reports she has a history of liver cirrhosis and wants to get a CT of the abdomen and pelvis and she was scheduled to get it done next week.  DVT prophylaxis: (SCD'S) Code Status: full code.  Family Communication: family at bedside. Today.  Disposition:   Status is: Inpatient  Remains inpatient appropriate because:Unsafe d/c plan   Dispo: The patient is from: Home              Anticipated d/c is to: CIR              Anticipated d/c date is: 1 day              Patient currently is medically stable to d/c.       Consultants:   Neurology  Nephrology   Procedures:    Antimicrobials: none.    Subjective: No new complaints.   Objective: Vitals:   12/16/19 2321 12/17/19 0351 12/17/19 0700 12/17/19 0737  BP: (!) 145/71 140/77  114/64  Pulse:  80 81 87 83  Resp: 20 18 (!) 21 20  Temp: 97.8 F (36.6 C) 98 F (36.7 C)  98.2 F (36.8 C)  TempSrc: Oral Oral  Oral  SpO2: 95% 94% 93% 93%  Weight:      Height:       No intake or output data in the 24 hours ending 12/17/19 1126 Filed Weights   12/12/19 2112 12/13/19 1730 12/14/19 2230  Weight: 57.4 kg 56 kg 74.3 kg    Examination:  General exam : Alert and comfortable, not in any distress.  Respiratory system: clear  to auscultation , no wheezing or rhonchi.  Cardiovascular system: S1s2 heard, no JVD, no pedal edema.  Gastrointestinal system: Abdomen is soft, NT ND BS+ Central nervous system: Alert and oriented , non focal  Extremities: No pedal edema or cyanosis.  Skin: No rashes seen.  Psychiatry: slightly anxious.     Data Reviewed: I have personally reviewed following labs and imaging studies  CBC: Recent Labs  Lab 12/12/19 1512 12/12/19 1528 12/13/19 0828  WBC  --  3.3* 3.8*  NEUTROABS  --  1.8  --   HGB 12.2 11.1* 11.5*  HCT 36.0 34.6* 36.7  MCV  --  94.8 94.6  PLT  --  131* 131*    Basic Metabolic Panel: Recent Labs  Lab 12/12/19 1512 12/12/19 1528 12/13/19 1018 12/16/19 1717  NA 141 140 141 134*  K 4.4 4.7 5.1 3.4*  CL 102 99 100 93*  CO2  --  27 24 28   GLUCOSE 85 91 78 178*  BUN 47* 42* 51* 26*  CREATININE 6.30* 6.18* 7.48* 4.78*  CALCIUM  --  8.8* 8.9 8.5*  PHOS  --   --  7.7*  --     GFR: Estimated Creatinine Clearance: 12.5 mL/min (A) (by C-G formula based on SCr of 4.78 mg/dL (H)).  Liver Function Tests: Recent Labs  Lab 12/12/19 1528 12/13/19 1018  AST 35  --   ALT 25  --   ALKPHOS 152*  --   BILITOT 0.6  --   PROT 7.2  --   ALBUMIN 2.9* 2.8*    CBG: Recent Labs  Lab 12/16/19 1726 12/16/19 1737 12/16/19 1740 12/16/19 2200 12/17/19 0617  GLUCAP 32* 11* 119* 87 81     Recent Results (from the past 240 hour(s))  MRSA PCR Screening     Status: None   Collection Time: 12/12/19  9:15 PM   Specimen: Nasal Mucosa; Nasopharyngeal  Result Value Ref Range Status   MRSA by PCR NEGATIVE NEGATIVE Final    Comment:        The GeneXpert MRSA Assay (FDA approved for NASAL specimens only), is one component of a comprehensive MRSA colonization surveillance program. It is not intended to diagnose MRSA infection nor to guide or monitor treatment for MRSA infections. Performed at [facility], [place] [address], [place], [place] [address]   Resp  Panel by RT-PCR (Flu A&B, Covid) Nasopharyngeal Swab     Status: None   Collection Time: 12/12/19 11:39 PM   Specimen: Nasopharyngeal Swab; Nasopharyngeal(NP) swabs in vial transport medium  Result Value Ref Range Status   SARS Coronavirus 2 by RT PCR NEGATIVE NEGATIVE Final    Comment: (NOTE) SARS-CoV-2 target nucleic acids are NOT DETECTED.  The SARS-CoV-2 RNA is generally detectable in upper respiratory specimens during the acute phase of infection. The lowest concentration of SARS-CoV-2 viral copies this assay can detect is 138 copies/mL. A  negative result does not preclude SARS-Cov-2 infection and should not be used as the sole basis for treatment or other patient management decisions. A negative result may occur with  improper specimen collection/handling, submission of specimen other than nasopharyngeal swab, presence of viral mutation(s) within the areas targeted by this assay, and inadequate number of viral copies(<138 copies/mL). A negative result must be combined with clinical observations, patient history, and epidemiological information. The expected result is Negative.  Fact Sheet for Patients:  EntrepreneurPulse.com.au  Fact Sheet for Healthcare Providers:  IncredibleEmployment.be  This test is no t yet approved or cleared by the [place] FDA and  has been authorized for detection and/or diagnosis of SARS-CoV-2 by FDA under an Emergency Use Authorization (EUA). This EUA will remain  in effect (meaning this test can be used) for the duration of the COVID-19 declaration under Section 564(b)(1) of the Act, 21 U.S.C.section 360bbb-3(b)(1), unless the authorization is terminated  or revoked sooner.       Influenza A by PCR NEGATIVE NEGATIVE Final   Influenza B by PCR NEGATIVE NEGATIVE Final    Comment: (NOTE) The Xpert Xpress SARS-CoV-2/FLU/RSV plus assay is intended as an aid in the diagnosis of influenza from Nasopharyngeal  swab specimens and should not be used as a sole basis for treatment. Nasal washings and aspirates are unacceptable for Xpert Xpress SARS-CoV-2/FLU/RSV testing.  Fact Sheet for Patients: EntrepreneurPulse.com.au  Fact Sheet for Healthcare Providers: IncredibleEmployment.be  This test is not yet approved or cleared by the [place] FDA and has been authorized for detection and/or diagnosis of SARS-CoV-2 by FDA under an Emergency Use Authorization (EUA). This EUA will remain in effect (meaning this test can be used) for the duration of the COVID-19 declaration under Section 564(b)(1) of the Act, 21 U.S.C. section 360bbb-3(b)(1), unless the authorization is terminated or revoked.  Performed at [facility], [place] [address], [place], [place] [address]          Radiology Studies: DG CHEST PORT 1 VIEW  Result Date: 12/15/2019 CLINICAL DATA:  Cough and congestion. EXAM: PORTABLE CHEST 1 VIEW COMPARISON:  07/27/2018 FINDINGS: Mild enlargement of the cardiopericardial silhouette. No mediastinal or hilar masses. Lungs are hyperexpanded. Mild chronic interstitial thickening. No areas of lung consolidation. No evidence of pulmonary edema. No pleural effusion or pneumothorax. Skeletal structures are grossly intact. IMPRESSION: No acute cardiopulmonary disease. Electronically Signed   By: Lajean Manes M.D.   On: 12/15/2019 17:43        Scheduled Meds: .  stroke: mapping our early stages of recovery book   Does not apply Once  . ambrisentan  5 mg Oral Daily  . calcium acetate  1,334 mg Oral TID WC  . Chlorhexidine Gluconate Cloth  6 each Topical Daily  . Chlorhexidine Gluconate Cloth  6 each Topical Q0600  . Chlorhexidine Gluconate Cloth  6 each Topical Q0600  . cinacalcet  30 mg Oral Q T,Th,Sa-HD  . doxercalciferol  6 mcg Intravenous Q T,Th,Sa-HD  . fluticasone  1 spray Each Nare Daily  . loratadine  10 mg Oral Daily  . pantoprazole  40  mg Oral Daily  . pentoxifylline  400 mg Oral TID WC  . Selexipag  800 mcg Oral BID  . senna-docusate  1 tablet Oral BID  . tadalafil  20 mg Oral Daily   Continuous Infusions: . sodium chloride    . sodium chloride       LOS: 5 days  Hosie Poisson, MD [facility]   To contact the attending provider between 7A-7P or the covering provider during after hours 7P-7A, please log into the web site [email or web address] and access using universal [place] password for that web site. If you do not have the password, please call the hospital operator.  12/17/2019, 11:26 AM

## 2019-12-17 NOTE — Evaluation (Signed)
Occupational Therapy Evaluation Patient Details Name: Katie Nunez MRN: [id number] DOB: [birth year] Today's Date: 12/17/2019    History of Present Illness The pt is a 64 yo female presenting with acute R-sided weakness, found to have L frontal ICH. PMH includes: anxiety, ESRD on HD TTS, HTN, IBS, RA, fibromyalgia, a fib on Coumadin.    Clinical Impression   Pt presents supine in bed pleasant and willing to participate in therapy session. Use of Stedy for functional transfers today to support RLE with pt able to perform sit<>stand overall with modA. Pt tolerating OOB to [facility] for BM and then to recliner. Pt remains with notable R side weakness and requires hands on assist to maintain standing balance; given LUE support pt able to maintain static sitting balance with minguard assist. VSS throughout. Feel pt remains an excellent candidate for CIR level therapies at time of discharge. Will continue to follow acutely.     Follow Up Recommendations  CIR;Supervision/Assistance - 24 hour    Equipment Recommendations  3 in 1 bedside commode;Wheelchair (measurements OT);Wheelchair cushion (measurements OT);Other (comment) (TBD)           Precautions / Restrictions Precautions Precautions: Fall Precaution Comments: Rt hemiplegia  Restrictions Weight Bearing Restrictions: No      Mobility Bed Mobility Overal bed mobility: Needs Assistance Bed Mobility: Supine to Sit     Supine to sit: Mod assist     General bed mobility comments: assist for RLE, initial trunk elevation and to scoot hips towards EOB     Transfers Overall transfer level: Needs assistance Equipment used: Ambulation equipment used Transfers: Sit to/from Stand Sit to Stand: Mod assist         General transfer comment: boosting assist to rise to [facility] with VCs for safe LUE placement and assist to support RUE. once upright pt able to maintain standing balance intermittently with minA. completed multiple sit<>Stand  using Stedy for transfer to [facility] and then to recliner.     Balance Overall balance assessment: Needs assistance Sitting-balance support: Feet supported Sitting balance-Leahy Scale: Poor Sitting balance - Comments: reliant on UE support    Standing balance support: Single extremity supported;Bilateral upper extremity supported Standing balance-Leahy Scale: Poor Standing balance comment: external assist and UE support                            ADL either performed or assessed with clinical judgement   ADL Overall ADL's : Needs assistance/impaired                     Lower Body Dressing: Moderate assistance;Sit to/from stand Lower Body Dressing Details (indicate cue type and reason): pt is able to don L sock while sitting up in bed via one hand technique, assist for R sock   Toilet Transfer Details (indicate cue type and reason): use of Stedy for transfer to [place] for sit<>stand at [place] and Hygiene: Total assistance;Sit to/from stand Toileting - Clothing Manipulation Details (indicate cue type and reason): assist for pericare after BM     Functional mobility during ADLs: Moderate assistance (sit<>stand at [facility])                           Pertinent Vitals/Pain Pain Assessment: No/denies pain     Hand Dominance     Extremity/Trunk Assessment  Communication     Cognition Arousal/Alertness: Awake/alert Behavior During Therapy: WFL for tasks assessed/performed Overall Cognitive Status: Impaired/Different from baseline Area of Impairment: Attention;Memory;Following commands;Safety/judgement;Awareness;Problem solving                   Current Attention Level: Selective Memory: Decreased short-term memory Following Commands: Follows one step commands consistently;Follows multi-step commands with increased time Safety/Judgement: Decreased awareness of deficits;Decreased awareness of  safety Awareness: Emergent Problem Solving: Difficulty sequencing;Requires verbal cues;Requires tactile cues General Comments: good command follow today, demonstrates increased awareness    General Comments  VSS on RA    Exercises     Shoulder Instructions      Home Living                                          Prior Functioning/Environment                   OT Problem List:        OT Treatment/Interventions:      OT Goals(Current goals can be found in the care plan section) Acute Rehab OT Goals Patient Stated Goal: to be able to walk and to write  OT Goal Formulation: With patient Time For Goal Achievement: 12/27/19 Potential to Achieve Goals: Good ADL Goals Pt Will Perform Grooming: with mod assist;standing Pt Will Perform Upper Body Bathing: with set-up;with supervision;sitting Pt Will Perform Lower Body Bathing: with min assist;sit to/from stand Pt Will Perform Upper Body Dressing: with supervision;sitting Pt Will Perform Lower Body Dressing: with min assist;sit to/from stand Pt Will Transfer to Toilet: with min assist;stand pivot transfer;bedside commode Pt Will Perform Toileting - Clothing Manipulation and hygiene: with min assist;sit to/from stand Pt/caregiver will Perform Home Exercise Program: Increased ROM;Right Upper extremity;With Supervision;With written HEP provided Additional ADL Goal #1: Pt will use Rt UE as a stabilizer with min A during ADLs. .  OT Frequency: Min 2X/week   Barriers to D/C:            Co-evaluation              AM-PAC OT "6 Clicks" Daily Activity     Outcome Measure Help from another person eating meals?: A Little Help from another person taking care of personal grooming?: A Little Help from another person toileting, which includes using toliet, bedpan, or urinal?: A Lot Help from another person bathing (including washing, rinsing, drying)?: A Lot Help from another person to put on and taking off  regular upper body clothing?: A Lot Help from another person to put on and taking off regular lower body clothing?: A Lot 6 Click Score: 14   End of Session Equipment Utilized During Treatment: Gait belt Nurse Communication: Mobility status;Need for lift equipment  Activity Tolerance: Patient tolerated treatment well Patient left: in chair;with call bell/phone within reach;with chair alarm set  OT Visit Diagnosis: Hemiplegia and hemiparesis Hemiplegia - Right/Left: Right Hemiplegia - dominant/non-dominant: Dominant Hemiplegia - caused by: Nontraumatic intracerebral hemorrhage                Time: 1459-1547 OT Time Calculation (min): 48 min Charges:  OT General Charges $OT Visit: 1 Visit OT Treatments $Self Care/Home Management : 38-52 mins  Katie Nunez, OT Acute Rehabilitation Services Pager [phone] Office [phone]   Katie Nunez 12/17/2019, 4:06 PM

## 2019-12-17 NOTE — Progress Notes (Signed)
Katie Nunez  Assessment/ Plan: Pt is a 64 y.o. yo female with ESRD on HD presented with right hemiplegia secondary to  [place].  HD orders: [place] T, Th,S 3.5 hrs 450/600 2.0 K/2.25 Ca 58.5 kg UFP 2 linear Na  L AVF -No Heparin -Mircera 50 mcg IV q 4 weeks -Hectorol 6 mcg IV TIW -Sensipar 30 mg PO TIW  # ICH-thought secondary to fairly new Coumadin therapy. Coumadin was held, reversing agents given, neurology managing. Repeat CT of head 12/02 without evidence of extension of hematoma.Plans for CIR for rehab.   #ESRD:We will continue with TTS schedule. HD Tuesday.   #Hypertension/Volume- No evidence of volume overload. UF as tol next HD.   Monitor blood pressure.  #Anemia of ESRD:Hgb 11.5. No ESA needed.  #Secondary hyperparathyroidism: Phosphorus level elevated.  Increase PhosLo.  Continue Sensipar and Hectorol.  #pulmonary HTN. Follows with cardiology at [facility]. Currently on tadalafil and Uptravi for her pulmonary hypertension.Marland Kitchen Avoid hypotension.   Subjective: Seen and examined.  Denies nausea, vomiting, chest pain or shortness of breath.  Reports loose BM.  No abdominal pain. Objective Vital signs in last 24 hours: Vitals:   12/16/19 2023 12/16/19 2321 12/17/19 0351 12/17/19 0700  BP: 124/62 (!) 145/71 140/77   Pulse: 83 80 81 87  Resp: 20 20 18  (!) 21  Temp: 98.3 F (36.8 C) 97.8 F (36.6 C) 98 F (36.7 C)   TempSrc: Oral Oral Oral   SpO2: 91% 95% 94% 93%  Weight:      Height:       Weight change:  No intake or output data in the 24 hours ending 12/17/19 0948     Labs: Basic Metabolic Panel: Recent Labs  Lab 12/12/19 1528 12/13/19 1018 12/16/19 1717  NA 140 141 134*  K 4.7 5.1 3.4*  CL 99 100 93*  CO2 27 24 28   GLUCOSE 91 78 178*  BUN 42* 51* 26*  CREATININE 6.18* 7.48* 4.78*  CALCIUM 8.8* 8.9 8.5*  PHOS  --  7.7*  --    Liver Function Tests: Recent Labs  Lab 12/12/19 1528 12/13/19 1018  AST 35  --   ALT  25  --   ALKPHOS 152*  --   BILITOT 0.6  --   PROT 7.2  --   ALBUMIN 2.9* 2.8*   No results for input(s): LIPASE, AMYLASE in the last 168 hours. No results for input(s): AMMONIA in the last 168 hours. CBC: Recent Labs  Lab 12/12/19 1512 12/12/19 1528 12/13/19 0828  WBC  --  3.3* 3.8*  NEUTROABS  --  1.8  --   HGB 12.2 11.1* 11.5*  HCT 36.0 34.6* 36.7  MCV  --  94.8 94.6  PLT  --  131* 131*   Cardiac Enzymes: No results for input(s): CKTOTAL, CKMB, CKMBINDEX, TROPONINI in the last 168 hours. CBG: Recent Labs  Lab 12/16/19 1726 12/16/19 1737 12/16/19 1740 12/16/19 2200 12/17/19 0617  GLUCAP 32* 11* 119* 87 81    Iron Studies: No results for input(s): IRON, TIBC, TRANSFERRIN, FERRITIN in the last 72 hours. Studies/Results: DG CHEST PORT 1 VIEW  Result Date: 12/15/2019 CLINICAL DATA:  Cough and congestion. EXAM: PORTABLE CHEST 1 VIEW COMPARISON:  07/27/2018 FINDINGS: Mild enlargement of the cardiopericardial silhouette. No mediastinal or hilar masses. Lungs are hyperexpanded. Mild chronic interstitial thickening. No areas of lung consolidation. No evidence of pulmonary edema. No pleural effusion or pneumothorax. Skeletal structures are grossly intact. IMPRESSION: No acute cardiopulmonary  disease. Electronically Signed   By: Lajean Manes M.D.   On: 12/15/2019 17:43    Medications: Infusions: . sodium chloride    . sodium chloride      Scheduled Medications: .  stroke: mapping our early stages of recovery book   Does not apply Once  . ambrisentan  5 mg Oral Daily  . calcium acetate  667 mg Oral TID WC  . Chlorhexidine Gluconate Cloth  6 each Topical Daily  . Chlorhexidine Gluconate Cloth  6 each Topical Q0600  . cinacalcet  30 mg Oral Q T,Th,Sa-HD  . doxercalciferol  6 mcg Intravenous Q T,Th,Sa-HD  . fluticasone  1 spray Each Nare Daily  . loratadine  10 mg Oral Daily  . pantoprazole  40 mg Oral Daily  . pentoxifylline  400 mg Oral TID WC  . Selexipag  800 mcg  Oral Daily  . senna-docusate  1 tablet Oral BID  . tadalafil  20 mg Oral Daily    have reviewed scheduled and prn medications.  Physical Exam: General:NAD, comfortable Heart:RRR, s1s2 nl Lungs:clear b/l, no crackle Abdomen:soft, Non-tender, non-distended Extremities:No edema Dialysis Access: Left upper extremity AV fistula has good thrill.  Katie Nunez 12/17/2019,9:48 AM  LOS: 5 days  Pager: [phone]

## 2019-12-17 NOTE — Progress Notes (Signed)
Inpatient Rehab Admissions Coordinator:   Met with patient at bedside to give update.  Insurance authorization pending.  Will continue to work towards possible rehab admission pending approval from insurance and bed availability this week.   Shann Medal, PT, DPT Admissions Coordinator [phone] 12/17/19  12:45 PM

## 2019-12-18 ENCOUNTER — Encounter (HOSPITAL_COMMUNITY): Payer: Self-pay | Admitting: Physical Medicine & Rehabilitation

## 2019-12-18 ENCOUNTER — Other Ambulatory Visit: Payer: Self-pay

## 2019-12-18 ENCOUNTER — Inpatient Hospital Stay (HOSPITAL_COMMUNITY)
Admission: RE | Admit: 2019-12-18 | Discharge: 2020-01-23 | DRG: 056 | Disposition: A | Discharge status: Home Health | Payer: Medicare Other | Source: Intra-hospital | Attending: Physical Medicine & Rehabilitation | Admitting: Physical Medicine & Rehabilitation

## 2019-12-18 DIAGNOSIS — J9611 Chronic respiratory failure with hypoxia: Secondary | ICD-10-CM | POA: Diagnosis present

## 2019-12-18 DIAGNOSIS — I5022 Chronic systolic (congestive) heart failure: Secondary | ICD-10-CM | POA: Diagnosis present

## 2019-12-18 DIAGNOSIS — I7300 Raynaud's syndrome without gangrene: Secondary | ICD-10-CM | POA: Diagnosis present

## 2019-12-18 DIAGNOSIS — M797 Fibromyalgia: Secondary | ICD-10-CM | POA: Diagnosis present

## 2019-12-18 DIAGNOSIS — I482 Chronic atrial fibrillation, unspecified: Secondary | ICD-10-CM | POA: Diagnosis present

## 2019-12-18 DIAGNOSIS — E871 Hypo-osmolality and hyponatremia: Secondary | ICD-10-CM | POA: Diagnosis present

## 2019-12-18 DIAGNOSIS — Z23 Encounter for immunization: Secondary | ICD-10-CM | POA: Diagnosis not present

## 2019-12-18 DIAGNOSIS — Z8672 Personal history of thrombophlebitis: Secondary | ICD-10-CM

## 2019-12-18 DIAGNOSIS — Z681 Body mass index (BMI) 19 or less, adult: Secondary | ICD-10-CM | POA: Diagnosis not present

## 2019-12-18 DIAGNOSIS — I81 Portal vein thrombosis: Secondary | ICD-10-CM | POA: Diagnosis present

## 2019-12-18 DIAGNOSIS — K649 Unspecified hemorrhoids: Secondary | ICD-10-CM | POA: Diagnosis present

## 2019-12-18 DIAGNOSIS — N186 End stage renal disease: Secondary | ICD-10-CM | POA: Diagnosis present

## 2019-12-18 DIAGNOSIS — M069 Rheumatoid arthritis, unspecified: Secondary | ICD-10-CM | POA: Diagnosis present

## 2019-12-18 DIAGNOSIS — J302 Other seasonal allergic rhinitis: Secondary | ICD-10-CM | POA: Diagnosis present

## 2019-12-18 DIAGNOSIS — G479 Sleep disorder, unspecified: Secondary | ICD-10-CM | POA: Diagnosis not present

## 2019-12-18 DIAGNOSIS — L299 Pruritus, unspecified: Secondary | ICD-10-CM | POA: Diagnosis not present

## 2019-12-18 DIAGNOSIS — K219 Gastro-esophageal reflux disease without esophagitis: Secondary | ICD-10-CM | POA: Diagnosis present

## 2019-12-18 DIAGNOSIS — I272 Pulmonary hypertension, unspecified: Secondary | ICD-10-CM | POA: Diagnosis present

## 2019-12-18 DIAGNOSIS — R599 Enlarged lymph nodes, unspecified: Secondary | ICD-10-CM | POA: Diagnosis not present

## 2019-12-18 DIAGNOSIS — I132 Hypertensive heart and chronic kidney disease with heart failure and with stage 5 chronic kidney disease, or end stage renal disease: Secondary | ICD-10-CM | POA: Diagnosis present

## 2019-12-18 DIAGNOSIS — R040 Epistaxis: Secondary | ICD-10-CM | POA: Diagnosis not present

## 2019-12-18 DIAGNOSIS — G4700 Insomnia, unspecified: Secondary | ICD-10-CM | POA: Diagnosis present

## 2019-12-18 DIAGNOSIS — G8191 Hemiplegia, unspecified affecting right dominant side: Secondary | ICD-10-CM | POA: Diagnosis not present

## 2019-12-18 DIAGNOSIS — I619 Nontraumatic intracerebral hemorrhage, unspecified: Secondary | ICD-10-CM | POA: Diagnosis not present

## 2019-12-18 DIAGNOSIS — N2581 Secondary hyperparathyroidism of renal origin: Secondary | ICD-10-CM | POA: Diagnosis present

## 2019-12-18 DIAGNOSIS — I69151 Hemiplegia and hemiparesis following nontraumatic intracerebral hemorrhage affecting right dominant side: Secondary | ICD-10-CM | POA: Diagnosis present

## 2019-12-18 DIAGNOSIS — J029 Acute pharyngitis, unspecified: Secondary | ICD-10-CM | POA: Diagnosis not present

## 2019-12-18 DIAGNOSIS — K220 Achalasia of cardia: Secondary | ICD-10-CM | POA: Diagnosis present

## 2019-12-18 DIAGNOSIS — K5901 Slow transit constipation: Secondary | ICD-10-CM | POA: Diagnosis not present

## 2019-12-18 DIAGNOSIS — M25511 Pain in right shoulder: Secondary | ICD-10-CM | POA: Diagnosis not present

## 2019-12-18 DIAGNOSIS — D61818 Other pancytopenia: Secondary | ICD-10-CM | POA: Diagnosis present

## 2019-12-18 DIAGNOSIS — K580 Irritable bowel syndrome with diarrhea: Secondary | ICD-10-CM | POA: Diagnosis not present

## 2019-12-18 DIAGNOSIS — I10 Essential (primary) hypertension: Secondary | ICD-10-CM

## 2019-12-18 DIAGNOSIS — Z7901 Long term (current) use of anticoagulants: Secondary | ICD-10-CM

## 2019-12-18 DIAGNOSIS — Z9109 Other allergy status, other than to drugs and biological substances: Secondary | ICD-10-CM

## 2019-12-18 DIAGNOSIS — D62 Acute posthemorrhagic anemia: Secondary | ICD-10-CM | POA: Diagnosis not present

## 2019-12-18 DIAGNOSIS — E559 Vitamin D deficiency, unspecified: Secondary | ICD-10-CM | POA: Diagnosis present

## 2019-12-18 DIAGNOSIS — F419 Anxiety disorder, unspecified: Secondary | ICD-10-CM | POA: Diagnosis present

## 2019-12-18 DIAGNOSIS — M109 Gout, unspecified: Secondary | ICD-10-CM | POA: Diagnosis present

## 2019-12-18 DIAGNOSIS — M623 Immobility syndrome (paraplegic): Secondary | ICD-10-CM | POA: Diagnosis not present

## 2019-12-18 DIAGNOSIS — Z888 Allergy status to other drugs, medicaments and biological substances status: Secondary | ICD-10-CM

## 2019-12-18 DIAGNOSIS — Z992 Dependence on renal dialysis: Secondary | ICD-10-CM

## 2019-12-18 DIAGNOSIS — M349 Systemic sclerosis, unspecified: Secondary | ICD-10-CM | POA: Diagnosis present

## 2019-12-18 DIAGNOSIS — E43 Unspecified severe protein-calorie malnutrition: Secondary | ICD-10-CM | POA: Insufficient documentation

## 2019-12-18 DIAGNOSIS — D631 Anemia in chronic kidney disease: Secondary | ICD-10-CM | POA: Diagnosis present

## 2019-12-18 DIAGNOSIS — K7460 Unspecified cirrhosis of liver: Secondary | ICD-10-CM | POA: Diagnosis present

## 2019-12-18 DIAGNOSIS — Z91048 Other nonmedicinal substance allergy status: Secondary | ICD-10-CM

## 2019-12-18 DIAGNOSIS — L89151 Pressure ulcer of sacral region, stage 1: Secondary | ICD-10-CM | POA: Diagnosis present

## 2019-12-18 DIAGNOSIS — E441 Mild protein-calorie malnutrition: Secondary | ICD-10-CM | POA: Diagnosis present

## 2019-12-18 DIAGNOSIS — L8990 Pressure ulcer of unspecified site, unspecified stage: Secondary | ICD-10-CM | POA: Insufficient documentation

## 2019-12-18 DIAGNOSIS — Z8249 Family history of ischemic heart disease and other diseases of the circulatory system: Secondary | ICD-10-CM

## 2019-12-18 DIAGNOSIS — M898X9 Other specified disorders of bone, unspecified site: Secondary | ICD-10-CM | POA: Diagnosis present

## 2019-12-18 DIAGNOSIS — Z9981 Dependence on supplemental oxygen: Secondary | ICD-10-CM

## 2019-12-18 DIAGNOSIS — Z79891 Long term (current) use of opiate analgesic: Secondary | ICD-10-CM

## 2019-12-18 DIAGNOSIS — Z79899 Other long term (current) drug therapy: Secondary | ICD-10-CM

## 2019-12-18 LAB — CBC
HCT: 32.5 % — ABNORMAL LOW (ref 36.0–46.0)
Hemoglobin: 10.4 g/dL — ABNORMAL LOW (ref 12.0–15.0)
MCH: 29.5 pg (ref 26.0–34.0)
MCHC: 32 g/dL (ref 30.0–36.0)
MCV: 92.1 fL (ref 80.0–100.0)
Platelets: 105 10*3/uL — ABNORMAL LOW (ref 150–400)
RBC: 3.53 MIL/uL — ABNORMAL LOW (ref 3.87–5.11)
RDW: 17.2 % — ABNORMAL HIGH (ref 11.5–15.5)
WBC: 2.8 10*3/uL — ABNORMAL LOW (ref 4.0–10.5)
nRBC: 0 % (ref 0.0–0.2)

## 2019-12-18 LAB — RENAL FUNCTION PANEL
Albumin: 2.6 g/dL — ABNORMAL LOW (ref 3.5–5.0)
Anion gap: 14 (ref 5–15)
BUN: 46 mg/dL — ABNORMAL HIGH (ref 8–23)
CO2: 26 mmol/L (ref 22–32)
Calcium: 8.7 mg/dL — ABNORMAL LOW (ref 8.9–10.3)
Chloride: 96 mmol/L — ABNORMAL LOW (ref 98–111)
Creatinine, Ser: 7.27 mg/dL — ABNORMAL HIGH (ref 0.44–1.00)
GFR, Estimated: 6 mL/min — ABNORMAL LOW (ref >60)
Glucose, Bld: 90 mg/dL (ref 70–99)
Phosphorus: 6.7 mg/dL — ABNORMAL HIGH (ref 2.5–4.6)
Potassium: 3.6 mmol/L (ref 3.5–5.1)
Sodium: 136 mmol/L (ref 135–145)

## 2019-12-18 MED ORDER — BISACODYL 10 MG RE SUPP
10.0000 mg | Freq: Every day | RECTAL | Status: DC | PRN
  Administered 2020-01-17: 10 mg via RECTAL
  Filled 2019-12-18: qty 1

## 2019-12-18 MED ORDER — DOXERCALCIFEROL 4 MCG/2ML IV SOLN
6.0000 ug | INTRAVENOUS | Status: DC
  Administered 2019-12-20 – 2020-01-03 (×3): 6 ug via INTRAVENOUS
  Filled 2019-12-18 (×9): qty 4

## 2019-12-18 MED ORDER — LORATADINE 10 MG PO TABS
10.0000 mg | ORAL_TABLET | Freq: Every day | ORAL | Status: DC
  Administered 2019-12-19 – 2020-01-23 (×34): 10 mg via ORAL
  Filled 2019-12-18 (×36): qty 1

## 2019-12-18 MED ORDER — HYDROCORTISONE ACETATE 25 MG RE SUPP
25.0000 mg | Freq: Two times a day (BID) | RECTAL | Status: DC
  Administered 2019-12-18 – 2020-01-16 (×32): 25 mg via RECTAL
  Filled 2019-12-18 (×52): qty 1

## 2019-12-18 MED ORDER — SODIUM CHLORIDE 0.9 % IV SOLN: 100.0000 mL | INTRAVENOUS | Status: DC | PRN

## 2019-12-18 MED ORDER — CLONAZEPAM 0.5 MG PO TABS
0.5000 mg | ORAL_TABLET | Freq: Two times a day (BID) | ORAL | Status: DC | PRN
  Administered 2019-12-18 – 2020-01-22 (×27): 0.5 mg via ORAL
  Filled 2019-12-18 (×28): qty 1

## 2019-12-18 MED ORDER — SIMETHICONE 80 MG PO CHEW
80.0000 mg | CHEWABLE_TABLET | Freq: Four times a day (QID) | ORAL | Status: DC | PRN
  Administered 2020-01-13 – 2020-01-15 (×2): 80 mg via ORAL
  Filled 2019-12-18 (×2): qty 1

## 2019-12-18 MED ORDER — HEPARIN SODIUM (PORCINE) 1000 UNIT/ML DIALYSIS
1000.0000 [IU] | INTRAMUSCULAR | Status: DC | PRN
  Filled 2019-12-18: qty 1

## 2019-12-18 MED ORDER — ALTEPLASE 2 MG IJ SOLR: 2.0000 mg | Freq: Once | INTRAMUSCULAR | Status: DC | PRN

## 2019-12-18 MED ORDER — POLYETHYLENE GLYCOL 3350 17 G PO PACK: 17.0000 g | PACK | Freq: Every day | ORAL | Status: DC | PRN

## 2019-12-18 MED ORDER — POLYETHYLENE GLYCOL 3350 17 G PO PACK
17.0000 g | PACK | Freq: Every day | ORAL | Status: DC | PRN
  Administered 2019-12-21: 17 g via ORAL
  Filled 2019-12-18: qty 1

## 2019-12-18 MED ORDER — LIDOCAINE HCL (PF) 1 % IJ SOLN: 5.0000 mL | INTRAMUSCULAR | Status: DC | PRN

## 2019-12-18 MED ORDER — LIDOCAINE-PRILOCAINE 2.5-2.5 % EX CREA
1.0000 "application " | TOPICAL_CREAM | CUTANEOUS | Status: DC | PRN
  Filled 2019-12-18: qty 5

## 2019-12-18 MED ORDER — SALINE SPRAY 0.65 % NA SOLN
1.0000 | NASAL | Status: DC | PRN
  Filled 2019-12-18: qty 44

## 2019-12-18 MED ORDER — ACETAMINOPHEN 325 MG PO TABS
325.0000 mg | ORAL_TABLET | ORAL | Status: DC | PRN
  Administered 2019-12-18 – 2020-01-22 (×22): 650 mg via ORAL
  Filled 2019-12-18 (×24): qty 2

## 2019-12-18 MED ORDER — DIPHENHYDRAMINE HCL 12.5 MG/5ML PO ELIX
12.5000 mg | ORAL_SOLUTION | Freq: Four times a day (QID) | ORAL | Status: DC | PRN
  Administered 2019-12-19: 25 mg via ORAL
  Filled 2019-12-18 (×2): qty 10

## 2019-12-18 MED ORDER — FLUTICASONE PROPIONATE 50 MCG/ACT NA SUSP
1.0000 | Freq: Every day | NASAL | Status: DC
  Administered 2019-12-19 – 2019-12-24 (×5): 1 via NASAL
  Filled 2019-12-18: qty 16

## 2019-12-18 MED ORDER — GERHARDT'S BUTT CREAM
TOPICAL_CREAM | Freq: Four times a day (QID) | CUTANEOUS | Status: DC
  Administered 2019-12-18 – 2020-01-03 (×3): 1 via TOPICAL
  Filled 2019-12-18: qty 1

## 2019-12-18 MED ORDER — PENTOXIFYLLINE ER 400 MG PO TBCR
400.0000 mg | EXTENDED_RELEASE_TABLET | Freq: Every day | ORAL | Status: DC
  Administered 2019-12-19 – 2020-01-23 (×33): 400 mg via ORAL
  Filled 2019-12-18 (×38): qty 1

## 2019-12-18 MED ORDER — PENTAFLUOROPROP-TETRAFLUOROETH EX AERO: 1.0000 "application " | INHALATION_SPRAY | CUTANEOUS | Status: DC | PRN

## 2019-12-18 MED ORDER — HYDROCORTISONE (PERIANAL) 2.5 % EX CREA
TOPICAL_CREAM | Freq: Two times a day (BID) | CUTANEOUS | Status: DC | PRN
  Filled 2019-12-18 (×2): qty 28.35

## 2019-12-18 MED ORDER — TRAZODONE HCL 50 MG PO TABS
25.0000 mg | ORAL_TABLET | Freq: Every evening | ORAL | Status: DC | PRN
  Administered 2019-12-21: 25 mg via ORAL
  Filled 2019-12-18 (×2): qty 1

## 2019-12-18 MED ORDER — TADALAFIL 20 MG PO TABS
20.0000 mg | ORAL_TABLET | Freq: Every day | ORAL | Status: DC
  Administered 2019-12-19 – 2020-01-23 (×34): 20 mg via ORAL
  Filled 2019-12-18 (×37): qty 1

## 2019-12-18 MED ORDER — PROCHLORPERAZINE MALEATE 5 MG PO TABS: 5.0000 mg | ORAL_TABLET | Freq: Four times a day (QID) | ORAL | Status: DC | PRN

## 2019-12-18 MED ORDER — MILK AND MOLASSES ENEMA
1.0000 | Freq: Every day | RECTAL | Status: DC | PRN
  Filled 2019-12-18 (×2): qty 240

## 2019-12-18 MED ORDER — PANTOPRAZOLE SODIUM 40 MG PO TBEC
40.0000 mg | DELAYED_RELEASE_TABLET | Freq: Every day | ORAL | Status: DC
  Administered 2019-12-19 – 2019-12-23 (×5): 40 mg via ORAL
  Filled 2019-12-18 (×5): qty 1

## 2019-12-18 MED ORDER — SELEXIPAG 400 MCG PO TABS
800.0000 ug | ORAL_TABLET | Freq: Two times a day (BID) | ORAL | Status: DC
  Administered 2019-12-18 – 2020-01-17 (×54): 800 ug via ORAL
  Filled 2019-12-18 (×53): qty 1

## 2019-12-18 MED ORDER — SENNOSIDES-DOCUSATE SODIUM 8.6-50 MG PO TABS
1.0000 | ORAL_TABLET | Freq: Two times a day (BID) | ORAL | Status: DC
  Administered 2019-12-19: 1 via ORAL
  Filled 2019-12-18 (×2): qty 1

## 2019-12-18 MED ORDER — CALCIUM ACETATE (PHOS BINDER) 667 MG PO CAPS
1334.0000 mg | ORAL_CAPSULE | Freq: Three times a day (TID) | ORAL | Status: DC
  Administered 2019-12-19 – 2020-01-23 (×93): 1334 mg via ORAL
  Filled 2019-12-18 (×99): qty 2

## 2019-12-18 MED ORDER — ALUMINUM HYDROXIDE GEL 320 MG/5ML PO SUSP
10.0000 mL | Freq: Four times a day (QID) | ORAL | Status: DC | PRN
  Filled 2019-12-18: qty 30

## 2019-12-18 MED ORDER — PROCHLORPERAZINE EDISYLATE 10 MG/2ML IJ SOLN: 5.0000 mg | Freq: Four times a day (QID) | INTRAMUSCULAR | Status: DC | PRN

## 2019-12-18 MED ORDER — HYDROXYZINE HCL 25 MG PO TABS
25.0000 mg | ORAL_TABLET | Freq: Three times a day (TID) | ORAL | Status: DC | PRN
  Administered 2019-12-18: 25 mg via ORAL
  Filled 2019-12-18: qty 1

## 2019-12-18 MED ORDER — PROCHLORPERAZINE 25 MG RE SUPP: 12.5000 mg | Freq: Four times a day (QID) | RECTAL | Status: DC | PRN

## 2019-12-18 MED ORDER — AMBRISENTAN 5 MG PO TABS
5.0000 mg | ORAL_TABLET | Freq: Every day | ORAL | Status: DC
  Administered 2019-12-19 – 2020-01-23 (×34): 5 mg via ORAL
  Filled 2019-12-18 (×38): qty 1

## 2019-12-18 MED ORDER — HYDROCERIN EX CREA
TOPICAL_CREAM | Freq: Two times a day (BID) | CUTANEOUS | Status: DC
  Administered 2019-12-18 – 2019-12-27 (×3): 1 via TOPICAL
  Filled 2019-12-18 (×2): qty 113

## 2019-12-18 MED ORDER — ALTEPLASE 2 MG IJ SOLR
2.0000 mg | Freq: Once | INTRAMUSCULAR | Status: DC | PRN
  Filled 2019-12-18: qty 2

## 2019-12-18 MED ORDER — DOXERCALCIFEROL 4 MCG/2ML IV SOLN
INTRAVENOUS | Status: AC
  Administered 2019-12-18: 6 ug via INTRAVENOUS
  Filled 2019-12-18: qty 4

## 2019-12-18 MED ORDER — GUAIFENESIN-DM 100-10 MG/5ML PO SYRP
5.0000 mL | ORAL_SOLUTION | Freq: Four times a day (QID) | ORAL | Status: DC | PRN
  Administered 2020-01-09 – 2020-01-11 (×2): 10 mL via ORAL
  Filled 2019-12-18 (×2): qty 10

## 2019-12-18 MED ORDER — FAMOTIDINE 20 MG PO TABS
20.0000 mg | ORAL_TABLET | Freq: Every day | ORAL | Status: DC | PRN
  Filled 2019-12-18 (×2): qty 1

## 2019-12-18 MED ORDER — HYDROXYZINE HCL 25 MG PO TABS: 25.0000 mg | ORAL_TABLET | Freq: Three times a day (TID) | ORAL | Status: DC | PRN

## 2019-12-18 MED ORDER — TRAMADOL HCL 50 MG PO TABS
50.0000 mg | ORAL_TABLET | Freq: Two times a day (BID) | ORAL | Status: DC | PRN
  Administered 2019-12-18 – 2019-12-21 (×3): 50 mg via ORAL
  Filled 2019-12-18 (×3): qty 1

## 2019-12-18 MED ORDER — CINACALCET HCL 30 MG PO TABS
30.0000 mg | ORAL_TABLET | ORAL | Status: DC
  Administered 2019-12-20 – 2020-01-08 (×4): 30 mg via ORAL
  Filled 2019-12-18 (×9): qty 1

## 2019-12-18 MED ORDER — TRAZODONE HCL 50 MG PO TABS: 25.0000 mg | ORAL_TABLET | Freq: Every evening | ORAL | Status: DC | PRN

## 2019-12-18 NOTE — Progress Notes (Signed)
PMR Admission Coordinator Pre-Admission Assessment   Patient: Katie Nunez is an 64 y.o., female MRN: [id number] DOB: Aug 25, 1955 Height: '5\' 5"'  (165.1 cm) Weight: 58.5 kg                                                                                                                                                  Insurance Information HMO:     PPO: yes     PCP:      IPA:      80/20:      OTHER:  PRIMARY: [employer] Medicare      Policy#: [id number]      Subscriber: pt CM Name: Katie Nunez      Phone#: [phone]     Fax#: [phone] Pre-Cert#: [id number] [place] for CIR provided by Katie Nunez at [place] with updates due on 12/13 to fax listed above      Employer:  Benefits:  Phone #: [phone]     Name:  Eff. Date: 01/12/19     Deduct: $0      Out of Pocket Max: $3900 (met $3900)      Life Max: n/a  CIR: $345/day for days 1-6      SNF: 20 full days Outpatient:      Co-Pay: $35/visit Home Health: 100%      Co-Pay:  DME: 80%     Co-Ins: 20% Providers: pt choice SECONDARY: n/a      Policy#:       Phone#:    Development worker, community:       Phone#:    The Therapist, art Information Summary" for patients in Inpatient Rehabilitation Facilities with attached "Privacy Act [place] Records" was provided and verbally reviewed with: Patient and Family   Emergency Contact Information         Contact Information     Name Relation Home Work Mobile    [place] Spouse [phone]   [phone]    Katie Nunez     [place], [place] Friend     [phone]       Current Medical History  Patient Admitting Diagnosis: L frontal ICH   History of Present Illness: Katie Nunez is a 64 y.o. female with history of ESRD-HD on TTS, Raynaud's, scleroderma, achalasia, cirrhosis, PAH- oxygen dependent, A fib, who has had multiple recent admissions for cdiff colitis/portal vein thrombosis 09/2019 this fall, with addition of coumadin, as well as LLE abscess 10/2019, s/p drainage.   She was admitted on 12/12/2019 with onset of LUE/LLE weakness.  No reports of speech deficits, headaches or N/V.  She was found to have left frontal ICH with cerebral edema and INR 1.7 admission.  The brain showed no hemorrhage subcortical left frontal white matter with a single rim of vasogenic edema and few remote hemorrhages in bilateral upper cerebellum.  Coumadin reversed with Eppie Gibson  and vitamin K and neurology recommended keeping SBP less than 140.  Follow-up CT head on 12/2 showed no change in [place] with surrounding edema. Speech therapy evaluation done revealing high-level cognitive deficits with mild impairment. Wound care consulted for management of left leg wound and recommended packing with iodoform gauze.  PT/ OT evaluation revealed right hemiplegia with impaired cognition, decreased activity tolerance and impaired balance affecting ADLs and mobility.  CIR was recommended due to functional decline.   Complete NIHSS TOTAL: 8 Glasgow Coma Scale Score: 15   Past Medical History      Past Medical History:  Diagnosis Date  . Achalasia    . Anxiety    . Dysplasia of cervix, low grade (CIN 1)    . Environmental allergies      "all year long" (12/27/2016)  . ESRD (end stage renal disease) on dialysis [facility])      "TTS; [place]" (12/27/2016)  . Fibromyalgia    . GERD (gastroesophageal reflux disease)    . Gout    . Hypertension    . IBS (irritable bowel syndrome)    . MVP (mitral valve prolapse)    . RA (rheumatoid arthritis) (HCC)      FOLLOWED BY DR. SHANAHAN  . Raynaud's disease    . Scleroderma ([place])    . Seasonal allergies    . Thrombocytopenia ([place]) 07/01/2016    Acute fall to 13,000 07/01/16  . Tubular adenoma 01/08/2008    CECUM  . Vitamin D deficiency        Family History  family history includes Cerebral palsy in her cousin; Diabetes in her maternal grandmother, mother, and paternal grandmother; Heart disease in her father and paternal grandfather; Hypertension in her  maternal aunt and mother.   Prior Rehab/Hospitalizations:  Has the patient had prior rehab or hospitalizations prior to admission? Yes   Has the patient had major surgery during 100 days prior to admission? Yes   Current Medications    Current Facility-Administered Medications:  .  0.9 %  sodium chloride infusion, 100 mL, Intravenous, PRN, Corliss Parish, MD .  0.9 %  sodium chloride infusion, 100 mL, Intravenous, PRN, Corliss Parish, MD .  0.9 %  sodium chloride infusion, 100 mL, Intravenous, PRN, Rosita Fire, MD .  0.9 %  sodium chloride infusion, 100 mL, Intravenous, PRN, Rosita Fire, MD .  acetaminophen (TYLENOL) tablet 650 mg, 650 mg, Oral, Q4H PRN, 650 mg at 12/18/19 0703 **OR** acetaminophen (TYLENOL) 160 MG/5ML solution 650 mg, 650 mg, Per Tube, Q4H PRN **OR** acetaminophen (TYLENOL) suppository 650 mg, 650 mg, Rectal, Q4H PRN, Amie Portland, MD .  alteplase (CATHFLO ACTIVASE) injection 2 mg, 2 mg, Intracatheter, Once PRN, Corliss Parish, MD .  alteplase (CATHFLO ACTIVASE) injection 2 mg, 2 mg, Intracatheter, Once PRN, Rosita Fire, MD .  ambrisentan (LETAIRIS) tablet 5 mg, 5 mg, Oral, Daily, Rosalin Hawking, MD, 5 mg at 12/17/19 1124 .  calcium acetate (PHOSLO) capsule 1,334 mg, 1,334 mg, Oral, TID WC, Rosita Fire, MD, 1,334 mg at 12/17/19 1659 .  Chlorhexidine Gluconate Cloth 2 % PADS 6 each, 6 each, Topical, Daily, Amie Portland, MD, 6 each at 12/16/19 1026 .  Chlorhexidine Gluconate Cloth 2 % PADS 6 each, 6 each, Topical, Q0600, Corliss Parish, MD, 6 each at 12/18/19 0532 .  Chlorhexidine Gluconate Cloth 2 % PADS 6 each, 6 each, Topical, Q0600, Rosita Fire, MD, 6 each at 12/18/19 0532 .  cinacalcet (SENSIPAR) tablet 30 mg, 30  mg, Oral, Q T,Th,Sa-HD, Corliss Parish, MD, 30 mg at 12/15/19 1015 .  clonazePAM (KLONOPIN) tablet 0.5 mg, 0.5 mg, Oral, BID PRN, Rosalin Hawking, MD, 0.5 mg at 12/17/19 2139 .   doxercalciferol (HECTOROL) injection 6 mcg, 6 mcg, Intravenous, Q T,Th,Sa-HD, Wilson Singer I, RPH, 6 mcg at 12/18/19 0932 .  famotidine (PEPCID) tablet 20 mg, 20 mg, Oral, BID PRN, Rosalin Hawking, MD .  fluticasone (FLONASE) 50 MCG/ACT nasal spray 1 spray, 1 spray, Each Nare, Daily, Hosie Poisson, MD, 1 spray at 12/17/19 1220 .  heparin injection 1,000 Units, 1,000 Units, Dialysis, PRN, Corliss Parish, MD .  heparin injection 1,000 Units, 1,000 Units, Dialysis, PRN, Rosita Fire, MD .  hydrocortisone (ANUSOL-HC) 2.5 % rectal cream, , Rectal, BID PRN, Shalhoub, Sherryll Burger, MD, Given at 12/18/19 0027 .  hydrOXYzine (ATARAX/VISTARIL) tablet 25 mg, 25 mg, Oral, Q8H PRN, Vernelle Emerald, MD, 25 mg at 12/18/19 0703 .  labetalol (NORMODYNE) injection 5-10 mg, 5-10 mg, Intravenous, Q2H PRN, Rosalin Hawking, MD .  lidocaine (PF) (XYLOCAINE) 1 % injection 5 mL, 5 mL, Intradermal, PRN, Corliss Parish, MD .  lidocaine (PF) (XYLOCAINE) 1 % injection 5 mL, 5 mL, Intradermal, PRN, Rosita Fire, MD .  lidocaine-prilocaine (EMLA) cream 1 application, 1 application, Topical, PRN, Corliss Parish, MD .  lidocaine-prilocaine (EMLA) cream 1 application, 1 application, Topical, PRN, Rosita Fire, MD .  loratadine (CLARITIN) tablet 10 mg, 10 mg, Oral, Daily, Hosie Poisson, MD, 10 mg at 12/17/19 1123 .  pantoprazole (PROTONIX) EC tablet 40 mg, 40 mg, Oral, Daily, Hosie Poisson, MD, 40 mg at 12/17/19 1122 .  pentafluoroprop-tetrafluoroeth (GEBAUERS) aerosol 1 application, 1 application, Topical, PRN, Corliss Parish, MD .  pentafluoroprop-tetrafluoroeth (GEBAUERS) aerosol 1 application, 1 application, Topical, PRN, Rosita Fire, MD .  pentoxifylline (TRENTAL) CR tablet 400 mg, 400 mg, Oral, Daily, Hosie Poisson, MD .  Selexipag TABS 800 mcg - Patient Supplied Med, 800 mcg, Oral, BID, Hammons, Theone Murdoch, RPH, 800 mcg at 12/17/19 2139 .  senna-docusate (Senokot-S)  tablet 1 tablet, 1 tablet, Oral, BID, Amie Portland, MD, 1 tablet at 12/17/19 2140 .  sodium chloride (OCEAN) 0.65 % nasal spray 1 spray, 1 spray, Each Nare, PRN, Hosie Poisson, MD .  tadalafil (CIALIS) tablet 20 mg, 20 mg, Oral, Daily, Rosalin Hawking, MD, 20 mg at 12/17/19 1123 .  traMADol (ULTRAM) tablet 50 mg, 50 mg, Oral, Q12H PRN, Rosalin Hawking, MD, 50 mg at 12/18/19 0030   Patients Current Diet:     Diet Order                      Diet renal with fluid restriction Fluid restriction: 1200 mL Fluid; Room service appropriate? Yes with Assist; Fluid consistency: Thin  Diet effective now                      Precautions / Restrictions Precautions Precautions: Fall Precaution Comments: Rt hemiplegia  Restrictions Weight Bearing Restrictions: No    Has the patient had 2 or more falls or a fall with injury in the past year?Yes   Prior Activity Level Limited Community (1-2x/wk): had been driving herself to dialysis, mobility was more difficult in the recent months, on O2 at baseline   Prior Functional Level Prior Function Level of Independence: Needs assistance Gait / Transfers Assistance Needed: assistance for stairs, use of cane intermittently ADL's / Homemaking Assistance Needed: pt stands to take shower, supervision for safety. assist for  LB dressing to complete ADLs quickly, pt can do some independently. pt still driving short distances Comments: Pt reports she drives self to HD.  She uses 2L during day, 3L O2 at night (pt states she leaves it on 2L: most days and nights)   Self Care: Did the patient need help bathing, dressing, using the toilet or eating?  Independent   Indoor Mobility: Did the patient need assistance with walking from room to room (with or without device)? Independent   Stairs: Did the patient need assistance with internal or external stairs (with or without device)? Needed some help   Functional Cognition: Did the patient need help planning regular tasks  such as shopping or remembering to take medications? Independent   Home Equities trader / Equipment Home Assistive Devices/Equipment: None Home Equipment: Shower seat, Bedside commode, Cane - single point   Prior Device Use: Indicate devices/aids used by the patient prior to current illness, exacerbation or injury? SPC occasionally used   Current Functional Level Cognition   Arousal/Alertness: Awake/alert Overall Cognitive Status: Impaired/Different from baseline Current Attention Level: Selective Orientation Level: Oriented X4 Following Commands: Follows one step commands inconsistently, Follows one step commands with increased time Safety/Judgement: Decreased awareness of safety, Decreased awareness of deficits General Comments: fatigue of R side impedes following commands to move R LE Attention: Focused, Sustained Focused Attention: Appears intact Sustained Attention: Appears intact Memory: Appears intact (Immediate: 5/5; delayed: 5/5; paragraph: 6/8) Awareness: Appears intact Problem Solving: Appears intact Executive Function: Reasoning, Sequencing, Organizing Reasoning: Appears intact Sequencing: Impaired Sequencing Impairment: Verbal complex (clock drawing: 2/4) Organizing: Impaired Organizing Impairment: Verbal complex (Backward digit span: 1/2) Safety/Judgment: Appears intact    Extremity Assessment (includes Sensation/Coordination)   Upper Extremity Assessment: Defer to OT evaluation RUE Deficits / Details: PROM WFL.  No active movement noted.  Flaccid  RUE Coordination: decreased fine motor, decreased gross motor  Lower Extremity Assessment: RLE deficits/detail RLE Deficits / Details: PROM WFL, no clonus noted, no active movement or trace activation.  RLE Sensation: decreased light touch (pt reports minimal difference in sensation) RLE Coordination: decreased fine motor, decreased gross motor     ADLs   Overall ADL's : Needs assistance/impaired Eating/Feeding:  Set up, Sitting, Bed level Grooming: Wash/dry hands, Wash/dry face, Oral care, Brushing hair, Set up, Sitting Upper Body Bathing: Moderate assistance, Sitting Lower Body Bathing: Maximal assistance, Sit to/from stand Upper Body Dressing : Maximal assistance, Sitting Lower Body Dressing: Moderate assistance, Sit to/from stand Lower Body Dressing Details (indicate cue type and reason): pt is able to don L sock while sitting up in bed via one hand technique, assist for R sock Toilet Transfer: Moderate assistance, Maximal assistance, +2 for safety/equipment, Stand-pivot Toilet Transfer Details (indicate cue type and reason): use of Stedy for transfer to [place] for sit<>stand at [employer] and Hygiene: Total assistance, Sit to/from stand Toileting - Clothing Manipulation Details (indicate cue type and reason): assist for pericare after BM Functional mobility during ADLs: Moderate assistance (sit<>stand at [facility]) General ADL Comments: pt continues to present with R sided weakness, decreased activity tolerance, and  impaired balance     Mobility   Overal bed mobility: Needs Assistance Bed Mobility: Supine to Sit Rolling: Mod assist, +2 for physical assistance Supine to sit: Mod assist General bed mobility comments: up in chair     Transfers   Overall transfer level: Needs assistance Equipment used: Ambulation equipment used Transfer via Lift Equipment: Stedy Transfers: Sit to/from Stand Sit to Stand: Mod  assist Stand pivot transfers: Mod assist, +2 physical assistance, +2 safety/equipment General transfer comment: in chair and fatigued to restand     Ambulation / Gait / Stairs / Wheelchair Mobility   Ambulation/Gait Ambulation/Gait assistance: Mod assist, +2 physical assistance Gait Distance (Feet): 4 Feet Assistive device: 2 person hand held assist Gait Pattern/deviations: Step-to pattern, Decreased stride length, Trunk flexed, Narrow base of  support General Gait Details: assist of 2 to maintain posture and upright. blocking to R knee, facilitation of wt shfit to L and modA to complete movement of RLE.  Gait velocity: decreased Gait velocity interpretation: <1.31 ft/sec, indicative of household ambulator     Posture / Balance Dynamic Sitting Balance Sitting balance - Comments: reliant on UE support  Balance Overall balance assessment: Needs assistance Sitting-balance support: Feet supported Sitting balance-Leahy Scale: Poor Sitting balance - Comments: reliant on UE support  Postural control: Posterior lean, Right lateral lean Standing balance support: Single extremity supported, Bilateral upper extremity supported Standing balance-Leahy Scale: Poor Standing balance comment: external assist and UE support      Special needs/care consideration Dialysis: Hemodialysis Tuesday, Thursday and Saturday, Oxygen 2-3L and Skin wound on LLE prior to admission        Previous Home Environment (from acute therapy documentation) Living Arrangements: Spouse/significant other  Lives With: Spouse, Son Available Help at Discharge: Family Type of Home: House Home Layout: Multi-level Alternate Level Stairs-Rails: Left (wall on R) Alternate Level Stairs-Number of Steps: 12-15 Home Access: Stairs to enter [employer] of Steps: 1 to enter garage, 2 at front (rails at front but pt reports she uses garage) Bathroom Shower/Tub: Walk-in shower, Tub only Biochemist, clinical: Handicapped height Home Care Services: No Additional Comments: spouse present to provide info.  pt has been sleeping on living room couch due to difficulty with stairs for 6-9 months.    Discharge Living Setting Plans for Discharge Living Setting: Patient's home Type of Home at Discharge: House Discharge Home Layout: Multi-level Alternate Level Stairs-Rails: Left Alternate Level Stairs-Number of Steps: 12-15 Discharge Home Access: Stairs to enter Entrance  Stairs-Rails: None Entrance Stairs-Number of Steps: 1 Discharge Bathroom Shower/Tub: Walk-in shower Discharge Bathroom Toilet: Handicapped height Discharge Bathroom Accessibility: Yes How Accessible: Accessible via walker Does the patient have any problems obtaining your medications?: No   Social/Family/Support Systems Patient Roles: Spouse Anticipated Caregiver: Wanisha Shiroma (spouse) Anticipated Caregiver's Contact Information: [phone] (cell) Ability/Limitations of Caregiver: supervision Caregiver Availability: 24/7 Discharge Plan Discussed with Primary Caregiver: Yes Is Caregiver In Agreement with Plan?: Yes Does Caregiver/Family have Issues with Lodging/Transportation while Pt is in Rehab?: No     Goals Patient/Family Goal for Rehab: PT/OT/SLP supervision to min assist Expected length of stay: 18-21 days Additional Information: per spouse, pt has been sleeping on the couch the last 6+ months due to difficulty on the stairs Pt/Family Agrees to Admission and willing to participate: Yes Program Orientation Provided & Reviewed with Pt/Caregiver Including Roles  & Responsibilities: Yes  Barriers to Discharge: Insurance for SNF coverage, Home environment access/layout  Barriers to Discharge Comments: stairs to bedroom     Decrease burden of Care through IP rehab admission: n/a   Possible need for SNF placement upon discharge: Not anticipated   Patient Condition: This patient's medical and functional status has changed since the consult dated: 12/14/19 in which the Rehabilitation Physician determined and documented that the patient's condition is appropriate for intensive rehabilitative care in an inpatient rehabilitation facility. See "History of Present Illness" (above) for medical update. Functional changes  are: pt is mod assist +1 for transfers. Patient's medical and functional status update has been discussed with the Rehabilitation physician and patient remains appropriate  for inpatient rehabilitation. Will admit to inpatient rehab today.   Preadmission Screen Completed By:  Michel Santee, PT, 12/18/2019 11:21 AM ______________________________________________________________________   Discussed status with Dr. Dagoberto Ligas on 12/18/19 at 11:34 AM  and received approval for admission today.   Admission Coordinator:  Michel Santee, PT, DPT time 11:34 AM Sudie Grumbling 12/18/19              Cosigned by: Courtney Heys, MD at 12/18/2019 11:54 AM

## 2019-12-18 NOTE — Progress Notes (Signed)
This pt has been sent to receiving unit. Pt has all belongings sent with the pt. All equipment has been sent as well. Husband is at bedside. Pt sent in good spirits.

## 2019-12-18 NOTE — Progress Notes (Signed)
Inpatient Rehab Admissions Coordinator:   I have insurance authorization and a bed available for pt to admit to CIR today.  Dr. Maryland Pink in agreement. I will let pt/family and TOC team know.   Shann Medal, PT, DPT Admissions Coordinator [phone] 12/18/19  10:03 AM

## 2019-12-18 NOTE — Discharge Summary (Signed)
[facility]  Physician Discharge Summary   Patient ID: Katie Nunez MRN: [id number] DOB/AGE: 1955/03/28 64 y.o.  Admit date: 12/12/2019 Discharge date: 12/18/2019  PCP: [name], MD  DISCHARGE DIAGNOSES:  Left frontal intracranial hemorrhage secondary to warfarin coagulopathy Atrial fibrillation Essential hypertension Hyperlipidemia End-stage renal disease on hemodialysis, Tuesday Thursday Saturday Pulmonary hypertension External hemorrhoids History of scleroderma/Raynaud's disease Constipation Liver cirrhosis  PATIENT BEING TRANSFERRED TO [facility]  RECOMMENDATIONS FOR OUTPATIENT FOLLOW UP: 1. Hold anticoagulation for at least 2 weeks 2. Outpatient follow-up with [facility] pulmonology for pulmonary artery hypertension 3. Outpatient follow-up with her gastroenterologist also at [facility] for liver cirrhosis    Home Health: Going to inpatient rehab   CODE STATUS: Full code  DISCHARGE CONDITION: fair  Diet recommendation: Renal  INITIAL HISTORY: Katie Nunez a 64 y.o.femalewith history of ESRD onHD TTS,fibromyalgia,hypertension,mixed connective tissue disorder, scleroderma,pulmonary hypertension,atrial fibrillation, cirrhosis-on Coumadin forportal vein thrombosis and atrial fibrillation,presenting with R sided weakness.  She was found to have left frontal intracranial hemorrhage probably secondary to warfarin.  Consultations:  Neurology  Nephrology  Procedures: Hemodialysis   HOSPITAL COURSE:   Left frontal ICH due to warfarin coagulopathy Patient seen by neurology.  Underwent repeat CT scans which was stable.   Echocardiogram showed left ventricular ejection fraction of 65 to 70%, no regional wall abnormalities and diastolic parameters consistent with grade 2 diastolic dysfunction.  She has signed self right ventricular pressure and volume overload.  Right ventricular systolic function is moderately reduced with  hypokinesis of the 3 wall and severely elevated pulmonary artery systolic pressure. Neurology recommends holding anti coagulation for 2 weeks.  Inpatient rehab was recommended by PT and OT.  Paroxysmal atrial fibrillation Rate controlled. Anti coagulation on hold for the bleed for at least 2 weeks as per neurology  Essential hypertension;  SBP goal is <160 mmhg. blood pressures well controlled.  Currently not on any blood pressure lowering agents.  Hyperlipidemia:  Statin could be resumed in 1 to 2 weeks  ESRD on HD, TTS Dialysis as per nephrology.  Pulmonary Hypertension:  Continue Letairis and Selexipag and cialis .   Scleroderma/ Raynaud's disease: Stable.  Continue home medications  Constipation/external hemorrhoids Patient on senna and suppository.  External examination of the rectal area showed external hemorrhoids without any inflammation.  Mild thrombocytopenia:  Likely due to liver cirrhosis.  Stable.  No evidence of overt bleeding.    Liver cirrhosis Patient reports she has a history of liver cirrhosis.  Outpatient follow-up.  Overall patient is stable.  She has a bed available at inpatient rehab today.  Okay for discharge.  PERTINENT LABS:  The results of significant diagnostics from this hospitalization (including imaging, microbiology, ancillary and laboratory) are listed below for reference.    Microbiology: Recent Results (from the past 240 hour(s))  MRSA PCR Screening     Status: None   Collection Time: 12/12/19  9:15 PM   Specimen: Nasal Mucosa; Nasopharyngeal  Result Value Ref Range Status   MRSA by PCR NEGATIVE NEGATIVE Final    Comment:        The GeneXpert MRSA Assay (FDA approved for NASAL specimens only), is one component of a comprehensive MRSA colonization surveillance program. It is not intended to diagnose MRSA infection nor to guide or monitor treatment for MRSA infections. Performed at [facility], [place] [address] [place], [place] [address]   Resp Panel by RT-PCR (Flu A&B, Covid) Nasopharyngeal Swab     Status: None  Collection Time: 12/12/19 11:39 PM   Specimen: Nasopharyngeal Swab; Nasopharyngeal(NP) swabs in vial transport medium  Result Value Ref Range Status   SARS Coronavirus 2 by RT PCR NEGATIVE NEGATIVE Final    Comment: (NOTE) SARS-CoV-2 target nucleic acids are NOT DETECTED.  The SARS-CoV-2 RNA is generally detectable in upper respiratory specimens during the acute phase of infection. The lowest concentration of SARS-CoV-2 viral copies this assay can detect is 138 copies/mL. A negative result does not preclude SARS-Cov-2 infection and should not be used as the sole basis for treatment or other patient management decisions. A negative result may occur with  improper specimen collection/handling, submission of specimen other than nasopharyngeal swab, presence of viral mutation(s) within the areas targeted by this assay, and inadequate number of viral copies(<138 copies/mL). A negative result must be combined with clinical observations, patient history, and epidemiological information. The expected result is Negative.  Fact Sheet for Patients:  EntrepreneurPulse.com.au  Fact Sheet for Healthcare Providers:  IncredibleEmployment.be  This test is no t yet approved or cleared by the [place] FDA and  has been authorized for detection and/or diagnosis of SARS-CoV-2 by FDA under an Emergency Use Authorization (EUA). This EUA will remain  in effect (meaning this test can be used) for the duration of the COVID-19 declaration under Section 564(b)(1) of the Act, 21 U.S.C.section 360bbb-3(b)(1), unless the authorization is terminated  or revoked sooner.       Influenza A by PCR NEGATIVE NEGATIVE Final   Influenza B by PCR NEGATIVE NEGATIVE Final    Comment: (NOTE) The Xpert Xpress SARS-CoV-2/FLU/RSV plus assay is intended as an aid in the  diagnosis of influenza from Nasopharyngeal swab specimens and should not be used as a sole basis for treatment. Nasal washings and aspirates are unacceptable for Xpert Xpress SARS-CoV-2/FLU/RSV testing.  Fact Sheet for Patients: EntrepreneurPulse.com.au  Fact Sheet for Healthcare Providers: IncredibleEmployment.be  This test is not yet approved or cleared by the [place] FDA and has been authorized for detection and/or diagnosis of SARS-CoV-2 by FDA under an Emergency Use Authorization (EUA). This EUA will remain in effect (meaning this test can be used) for the duration of the COVID-19 declaration under Section 564(b)(1) of the Act, 21 U.S.C. section 360bbb-3(b)(1), unless the authorization is terminated or revoked.  Performed at [facility], [place] [address] [place], [place] [address]      Labs:    Basic Metabolic Panel: Recent Labs  Lab 12/12/19 1512 12/12/19 1528 12/13/19 1018 12/16/19 1717 12/18/19 0635  NA 141 140 141 134* 136  K 4.4 4.7 5.1 3.4* 3.6  CL 102 99 100 93* 96*  CO2  --  27 24 28 26   GLUCOSE 85 91 78 178* 90  BUN 47* 42* 51* 26* 46*  CREATININE 6.30* 6.18* 7.48* 4.78* 7.27*  CALCIUM  --  8.8* 8.9 8.5* 8.7*  PHOS  --   --  7.7*  --  6.7*   Liver Function Tests: Recent Labs  Lab 12/12/19 1528 12/13/19 1018 12/18/19 0635  AST 35  --   --   ALT 25  --   --   ALKPHOS 152*  --   --   BILITOT 0.6  --   --   PROT 7.2  --   --   ALBUMIN 2.9* 2.8* 2.6*   CBC: Recent Labs  Lab 12/12/19 1512 12/12/19 1528 12/13/19 0828 12/18/19 0635  WBC  --  3.3* 3.8* 2.8*  NEUTROABS  --  1.8  --   --  HGB 12.2 11.1* 11.5* 10.4*  HCT 36.0 34.6* 36.7 32.5*  MCV  --  94.8 94.6 92.1  PLT  --  131* 131* 105*    CBG: Recent Labs  Lab 12/16/19 1737 12/16/19 1740 12/16/19 2200 12/17/19 0617 12/17/19 1144  GLUCAP 11* 119* 87 81 109*     IMAGING STUDIES DG Skull 1-3 Views  Result Date:  12/12/2019 CLINICAL DATA:  Encounter for foreign body in soft tissue. Left arm restricted for dialysis. Went to bed at midnight, woke up at 1 pm today. Flaccid right arm and leg. No vision changes. No facial droop. No withdrawal from on right leg and arm but can feel the pain. Pt does not make urine. No other complaints, headache, or dizziness. EXAM: SKULL - 1-3 VIEW COMPARISON:  None. FINDINGS: No skull fracture or lesion. No radiopaque foreign body.  Soft tissues are unremarkable. IMPRESSION: Negative. Electronically Signed   By: Lajean Manes M.D.   On: 12/12/2019 16:59   CT HEAD WO CONTRAST  Result Date: 12/13/2019 CLINICAL DATA:  Right-sided weakness EXAM: CT HEAD WITHOUT CONTRAST TECHNIQUE: Contiguous axial images were obtained from the base of the skull through the vertex without intravenous contrast. COMPARISON:  12/12/2019 at 6:21 p.m. FINDINGS: Brain: Unchanged size of intraparenchymal hematoma centered in the left frontal lobe measuring 1.7 x 3.1 cm. Surrounding edema is unchanged. The small adjacent satellite hemorrhage is also unchanged. No new site of hemorrhage. No midline shift or other mass effect. Vascular: No hyperdense vessel or unexpected calcification. Skull: Normal. Negative for fracture or focal lesion. Sinuses/Orbits: No acute finding. Other: None. IMPRESSION: Unchanged size of intraparenchymal hematoma centered in the left frontal lobe with surrounding edema. Electronically Signed   By: Ulyses Jarred M.D.   On: 12/13/2019 03:42   CT HEAD WO CONTRAST  Result Date: 12/12/2019 CLINICAL DATA:  Extremity weakness, stroke, intracranial hemorrhage EXAM: CT HEAD WITHOUT CONTRAST TECHNIQUE: Contiguous axial images were obtained from the base of the skull through the vertex without intravenous contrast. COMPARISON:  12/12/2019 at 5:05 p.m. FINDINGS: Brain: The left frontal intraparenchymal hemorrhage seen previously is again identified, measuring 3.1 x 1.7 by 2.9 cm, not significantly changed  since prior study. Smaller adjacent 0.5 cm focus of hemorrhage is also unchanged. Mild surrounding edema with regional mass effect is stable. No midline shift. Lateral ventricles and midline structures are unremarkable. Vascular: Stable atherosclerosis.  No hyperdense vessel. Skull: Normal. Negative for fracture or focal lesion. Sinuses/Orbits: Stable right maxillary sinus mucosal thickening. Other: Stable calcifications within the bilateral parotid glands. IMPRESSION: 1. Stable left frontal intraparenchymal hemorrhage and mild surrounding edema. Electronically Signed   By: Randa Ngo M.D.   On: 12/12/2019 18:51   CT HEAD WO CONTRAST  Result Date: 12/12/2019 CLINICAL DATA:  Right-sided weakness EXAM: CT HEAD WITHOUT CONTRAST TECHNIQUE: Contiguous axial images were obtained from the base of the skull through the vertex without intravenous contrast. COMPARISON:  None. FINDINGS: Brain: There is acute parenchymal hemorrhage within the posterior left frontal lobe involving the superior frontal and precentral gyri. This measures approximately 2.9 x 1.7 x 2.9 cm. Adjacent subcentimeter focus of hemorrhage. Mild surrounding edema with minor regional mass effect. Gray-white differentiation is preserved. Ventricles and sulci are within normal limits in size and configuration. Vascular: There is atherosclerotic calcification at the skull base. Skull: Calvarium is unremarkable. Sinuses/Orbits: No acute finding. Other: Mastoid air cells are clear. Multiple punctate bilateral parotid calculi. IMPRESSION: Acute left frontal parenchymal hemorrhage with mild edema. No significant mass effect. Critical  Value/emergent results were called by telephone at the time of interpretation on 12/12/2019 at 5:17 pm to provider [facility] , who verbally acknowledged these results. Electronically Signed   By: Macy Mis M.D.   On: 12/12/2019 17:22   MR ANGIO HEAD WO CONTRAST  Result Date: 12/13/2019 CLINICAL DATA:   Thrombocytopenia. EXAM: MRI HEAD WITHOUT CONTRAST MRA HEAD WITHOUT CONTRAST TECHNIQUE: Multiplanar, multiecho pulse sequences of the brain and surrounding structures were obtained without intravenous contrast. Angiographic images of the head were obtained using MRA technique without contrast. COMPARISON:  Head CT from earlier today and yesterday FINDINGS: MRI HEAD FINDINGS Brain: T1 and T2 hyperintense hematoma in the high subcortical posterior left frontal lobe which measures 3 x 1.8 cm on axial slices. Subcentimeter adjacent hemorrhage is present within a single rim of vasogenic edema. No underlying infarct is seen. No gross masslike finding detected. There are other remote microhemorrhages with nodular hemosiderin staining seen in the parasagittal right frontal lobe, bilateral anterior frontal white matter, and along the high and lateral left frontal lobe. No generalized chronic white matter disease. No hydrocephalus or extra-axial collection. Vascular: Arterial findings below. Normal dural venous sinus flow voids. Skull and upper cervical spine: Hypointense cervical marrow with mild clivus heterogeneity, likely red marrow reconversion in this dialysis patient. Sinuses/Orbits: Proteinaceous retention cysts in the right maxillary sinus. Other: Nodular appearance of the bilateral parotid glands with a 9 mm T1 hyperintense mass in the superficial left parotid. Small T2 hyperintense cysts in the right parotid. There is known history of scleroderma. MRA HEAD FINDINGS Vessels are smooth and widely patent.  No aneurysm or stenosis. IMPRESSION: Brain MRI: Known acute hemorrhage in the subcortical left frontal white matter with rim of edema. A few remote hemorrhages have also occurred in the bilateral upper cerebrum suggesting underlying vasculopathy or bleeding diathesis. Notable lack of chronic white matter disease. Intracranial MRA: Negative. Electronically Signed   By: Monte Fantasia M.D.   On: 12/13/2019 07:57    MR BRAIN WO CONTRAST  Result Date: 12/13/2019 CLINICAL DATA:  Thrombocytopenia. EXAM: MRI HEAD WITHOUT CONTRAST MRA HEAD WITHOUT CONTRAST TECHNIQUE: Multiplanar, multiecho pulse sequences of the brain and surrounding structures were obtained without intravenous contrast. Angiographic images of the head were obtained using MRA technique without contrast. COMPARISON:  Head CT from earlier today and yesterday FINDINGS: MRI HEAD FINDINGS Brain: T1 and T2 hyperintense hematoma in the high subcortical posterior left frontal lobe which measures 3 x 1.8 cm on axial slices. Subcentimeter adjacent hemorrhage is present within a single rim of vasogenic edema. No underlying infarct is seen. No gross masslike finding detected. There are other remote microhemorrhages with nodular hemosiderin staining seen in the parasagittal right frontal lobe, bilateral anterior frontal white matter, and along the high and lateral left frontal lobe. No generalized chronic white matter disease. No hydrocephalus or extra-axial collection. Vascular: Arterial findings below. Normal dural venous sinus flow voids. Skull and upper cervical spine: Hypointense cervical marrow with mild clivus heterogeneity, likely red marrow reconversion in this dialysis patient. Sinuses/Orbits: Proteinaceous retention cysts in the right maxillary sinus. Other: Nodular appearance of the bilateral parotid glands with a 9 mm T1 hyperintense mass in the superficial left parotid. Small T2 hyperintense cysts in the right parotid. There is known history of scleroderma. MRA HEAD FINDINGS Vessels are smooth and widely patent.  No aneurysm or stenosis. IMPRESSION: Brain MRI: Known acute hemorrhage in the subcortical left frontal white matter with rim of edema. A few remote hemorrhages have also occurred  in the bilateral upper cerebrum suggesting underlying vasculopathy or bleeding diathesis. Notable lack of chronic white matter disease. Intracranial MRA: Negative.  Electronically Signed   By: Monte Fantasia M.D.   On: 12/13/2019 07:57   DG CHEST PORT 1 VIEW  Result Date: 12/15/2019 CLINICAL DATA:  Cough and congestion. EXAM: PORTABLE CHEST 1 VIEW COMPARISON:  07/27/2018 FINDINGS: Mild enlargement of the cardiopericardial silhouette. No mediastinal or hilar masses. Lungs are hyperexpanded. Mild chronic interstitial thickening. No areas of lung consolidation. No evidence of pulmonary edema. No pleural effusion or pneumothorax. Skeletal structures are grossly intact. IMPRESSION: No acute cardiopulmonary disease. Electronically Signed   By: Lajean Manes M.D.   On: 12/15/2019 17:43   ECHOCARDIOGRAM COMPLETE  Result Date: 12/14/2019    ECHOCARDIOGRAM REPORT   Patient Name:   Katie Nunez Date of Exam: 12/14/2019 Medical Rec #:  [id number]        Height:       65.0 in Accession #:    [id number]       Weight:       123.5 lb Date of Birth:  09-30-1955       BSA:          1.611 m Patient Age:    73 years         BP:           113/57 mmHg Patient Gender: F                HR:           73 bpm. Exam Location:  Inpatient Procedure: 2D Echo Indications:    stroke 434.91  History:        Patient has prior history of Echocardiogram examinations, most                 recent 10/19/2017. Scleroderma. Raynauds. Pulmonary hypertension.                 Cirrhosis. End stage renal disease.; Arrythmias:Atrial                 Fibrillation.  Sonographer:    Johny Chess Referring Phys: [id number] ASHISH ARORA IMPRESSIONS  1. Left ventricular ejection fraction, by estimation, is 65 to 70%. The left ventricle has normal function. The left ventricle has no regional wall motion abnormalities. There is mild left ventricular hypertrophy. Left ventricular diastolic parameters are consistent with Grade II diastolic dysfunction (pseudonormalization). Elevated left atrial pressure. There is the interventricular septum is flattened in systole and diastole, consistent with right ventricular pressure  and volume overload.  2. Right ventricular systolic function is moderately reduced. Hypokinesis of free wall. The right ventricular size is mildly enlarged. There is severely elevated pulmonary artery systolic pressure. The estimated right ventricular systolic pressure is 36.1 mmHg.  3. Left atrial size was mildly dilated.  4. The mitral valve is normal in structure. Trivial mitral valve regurgitation. No evidence of mitral stenosis.  5. Tricuspid valve regurgitation is mild to moderate.  6. The aortic valve is tricuspid. Aortic valve regurgitation is trivial. Mild aortic valve sclerosis is present, with no evidence of aortic valve stenosis.  7. The inferior vena cava is dilated in size with <50% respiratory variability, suggesting right atrial pressure of 15 mmHg. FINDINGS  Left Ventricle: Left ventricular ejection fraction, by estimation, is 65 to 70%. The left ventricle has normal function. The left ventricle has no regional wall motion abnormalities. The left ventricular internal cavity size was normal in size. There  is  mild left ventricular hypertrophy. The interventricular septum is flattened in systole and diastole, consistent with right ventricular pressure and volume overload. Left ventricular diastolic parameters are consistent with Grade II diastolic dysfunction  (pseudonormalization). Elevated left atrial pressure. Right Ventricle: The right ventricular size is mildly enlarged. Right vetricular wall thickness was not assessed. Right ventricular systolic function is moderately reduced. There is severely elevated pulmonary artery systolic pressure. The tricuspid regurgitant velocity is 3.69 m/s, and with an assumed right atrial pressure of 15 mmHg, the estimated right ventricular systolic pressure is 44.8 mmHg. Left Atrium: Left atrial size was mildly dilated. Right Atrium: Right atrial size was normal in size. Pericardium: There is no evidence of pericardial effusion. Mitral Valve: The mitral valve is  normal in structure. Trivial mitral valve regurgitation. No evidence of mitral valve stenosis. Tricuspid Valve: The tricuspid valve is normal in structure. Tricuspid valve regurgitation is mild to moderate. Aortic Valve: The aortic valve is tricuspid. Aortic valve regurgitation is trivial. Mild aortic valve sclerosis is present, with no evidence of aortic valve stenosis. Aortic valve mean gradient measures 8.0 mmHg. Aortic valve peak gradient measures 16.2 mmHg. Aortic valve area, by VTI measures 1.80 cm. Pulmonic Valve: The pulmonic valve was grossly normal. Pulmonic valve regurgitation is mild. Aorta: The aortic root and ascending aorta are structurally normal, with no evidence of dilitation. Venous: The inferior vena cava is dilated in size with less than 50% respiratory variability, suggesting right atrial pressure of 15 mmHg. IAS/Shunts: The interatrial septum was not well visualized.  LEFT VENTRICLE PLAX 2D LVIDd:         4.20 cm  Diastology LVIDs:         2.30 cm  LV e' medial:    6.09 cm/s LV PW:         0.90 cm  LV E/e' medial:  14.4 LV IVS:        0.70 cm  LV e' lateral:   8.05 cm/s LVOT diam:     1.90 cm  LV E/e' lateral: 10.9 LV SV:         70 LV SV Index:   43 LVOT Area:     2.84 cm  RIGHT VENTRICLE            IVC RV S prime:     9.68 cm/s  IVC diam: 2.40 cm TAPSE (M-mode): 1.6 cm LEFT ATRIUM             Index       RIGHT ATRIUM           Index LA diam:        3.80 cm 2.36 cm/m  RA Area:     14.50 cm LA Vol (A2C):   56.0 ml 34.75 ml/m RA Volume:   35.00 ml  21.72 ml/m LA Vol (A4C):   62.4 ml 38.72 ml/m LA Biplane Vol: 62.1 ml 38.54 ml/m  AORTIC VALVE AV Area (Vmax):    1.72 cm AV Area (Vmean):   1.65 cm AV Area (VTI):     1.80 cm AV Vmax:           201.00 cm/s AV Vmean:          131.000 cm/s AV VTI:            0.390 m AV Peak Grad:      16.2 mmHg AV Mean Grad:      8.0 mmHg LVOT Vmax:         122.00 cm/s  LVOT Vmean:        76.200 cm/s LVOT VTI:          0.247 m LVOT/AV VTI ratio: 0.63  AORTA  Ao Root diam: 2.60 cm Ao Asc diam:  2.50 cm MITRAL VALVE               TRICUSPID VALVE MV Area (PHT): 3.37 cm    TR Peak grad:   54.5 mmHg MV Decel Time: 225 msec    TR Vmax:        369.00 cm/s MV E velocity: 87.40 cm/s MV A velocity: 65.60 cm/s  SHUNTS MV E/A ratio:  1.33        Systemic VTI:  0.25 m                            Systemic Diam: 1.90 cm Oswaldo Milian MD Electronically signed by Oswaldo Milian MD Signature Date/Time: 12/14/2019/7:41:46 PM    Final     DISCHARGE EXAMINATION: Vitals:   12/18/19 1100 12/18/19 1111 12/18/19 1121 12/18/19 1218  BP: (!) 98/53 (!) 98/53 (!) 107/56 (!) 108/56  Pulse:    88  Resp: (!) 21 19 16 20   Temp:  98.2 F (36.8 C)  98 F (36.7 C)  TempSrc:  Oral  Oral  SpO2:  99%  99%  Weight:  58.5 kg    Height:       General appearance: Awake alert.  In no distress Resp: Clear to auscultation bilaterally.  Normal effort Cardio: S1-S2 is normal regular.  No S3-S4.  No rubs murmurs or bruit GI: Abdomen is soft.  Nontender nondistended.  Bowel sounds are present normal.  No masses organomegaly    DISPOSITION: Inpatient rehab  Discharge Instructions    Ambulatory referral to Neurology   Complete by: As directed    Follow up in stroke clinic at [facility] with Frann Rider, NP in about 4 weeks following discharge from [facility] / rehab - anticipate she will not go until end of next week. If not available, consider Dr. Antony Contras, Dr. Bess Harvest, or Dr. Sarina Ill.       Current Inpatient Medications:  Scheduled: . ambrisentan  5 mg Oral Daily  . calcium acetate  1,334 mg Oral TID WC  . Chlorhexidine Gluconate Cloth  6 each Topical Daily  . Chlorhexidine Gluconate Cloth  6 each Topical Q0600  . Chlorhexidine Gluconate Cloth  6 each Topical Q0600  . cinacalcet  30 mg Oral Q T,Th,Sa-HD  . doxercalciferol  6 mcg Intravenous Q T,Th,Sa-HD  . fluticasone  1 spray Each Nare Daily  . loratadine  10 mg Oral Daily  .  pantoprazole  40 mg Oral Daily  . pentoxifylline  400 mg Oral Daily  . Selexipag  800 mcg Oral BID  . senna-docusate  1 tablet Oral BID  . tadalafil  20 mg Oral Daily   Continuous: . sodium chloride    . sodium chloride     RDE:YCXKGY chloride, sodium chloride, acetaminophen **OR** acetaminophen (TYLENOL) oral liquid 160 mg/5 mL **OR** acetaminophen, alteplase, clonazePAM, famotidine, heparin, hydrocortisone, hydrOXYzine, labetalol, lidocaine (PF), lidocaine-prilocaine, pentafluoroprop-tetrafluoroeth, polyethylene glycol, sodium chloride, traMADol     Follow-up Information    [facility] Follow up in 4 week(s).   Specialty: Neurology Why: stroke clinic after discharge from rehab. office will call with appt date and time Contact information: [address] [place] [phone]  TOTAL DISCHARGE TIME: 35 minutes  Alisi Lupien [employer] on [email or web address]  12/18/2019, 12:51 PM

## 2019-12-18 NOTE — Progress Notes (Signed)
SLP Cancellation Note  Patient Details Name: Katie Nunez MRN: [id number] DOB: 05/22/1955   Cancelled treatment:       Reason Eval/Treat Not Completed: Patient at procedure or test/unavailable   [name], Katherene Ponto 12/18/2019, 8:08 AM

## 2019-12-18 NOTE — Progress Notes (Signed)
Inpatient Rehabilitation Medication Review by a Pharmacist  A complete drug regimen review was completed for this patient to identify any potential clinically significant medication issues.  Clinically significant medication issues were identified:  no  Check AMION for pharmacist assigned to patient if future medication questions/issues arise during this admission.  Pharmacist comments:   Time spent performing this drug regimen review (minutes):  5   Katie Nunez 12/18/2019 4:51 PM

## 2019-12-18 NOTE — Progress Notes (Signed)
Physical Medicine and Rehabilitation Consult   Reason for Consult: ICH with functional deficits.  Referring Physician: Dr. Erlinda Hong    HPI: Katie Nunez is a 64 y.o. RH-female with history of ESRD-TTS, Raynaud's, scleroderma's, achalasia, cirrhosis, PAH- oxygen dependent, A fib who has had multiple recent admissions for  cdiff colitis/portal vein thrombosis 09/2019 this fall with addition of coumadin as well as LLE abscess 10/2019 s/p drainage.  She was admitted on 12/12/2019 with onset of LUE/LLE weakness.  No reports of speech deficits, headaches or N/V.  She was found to have right frontal ICH with cerebral edema and INR 1.7 admission.  The brain showed no hemorrhage subcortical left frontal white matter with a single rim of vasogenic edema and few remote hemorrhages in bilateral upper cerebellum.  Coumadin reversed with Kcentra and vitamin K and neurology recommended keeping SBP less than 140.  Follow-up CT head on 12/2 showed no change in [place] with surrounding edema. Speech therapy evaluation done revealing high-level cognitive deficits with mild impairment. Wound care consulted for management of left leg wound and recommended packing with iodoform gauze.  PT/ OT evaluation revealed right hemiplegia with impaired cognition, decreased activity tolerance and impaired balance affecting ADLs and mobility.  CIR was recommended due to functional decline.   Review of Systems  Constitutional: Negative for chills and fever.  HENT: Negative for hearing loss and tinnitus.   Eyes: Negative for blurred vision and double vision.  Respiratory: Negative for cough and shortness of breath.   Cardiovascular: Negative for palpitations and leg swelling.  Gastrointestinal: Positive for constipation (constipation alternating with incontinence). Negative for heartburn and nausea.  Musculoskeletal: Negative for back pain, joint pain and myalgias.  Skin: Negative for rash.  Neurological: Positive for focal  weakness. Negative for dizziness, tingling, sensory change and headaches.  Psychiatric/Behavioral: The patient has insomnia (stays up at nights and sleeps in ). The patient is not nervous/anxious.           Past Medical History:  Diagnosis Date  . Achalasia    . Anxiety    . Dysplasia of cervix, low grade (CIN 1)    . Environmental allergies      "all year long" (12/27/2016)  . ESRD (end stage renal disease) on dialysis [facility])      "TTS; [place]" (12/27/2016)  . Fibromyalgia    . GERD (gastroesophageal reflux disease)    . Gout    . Hypertension    . IBS (irritable bowel syndrome)    . MVP (mitral valve prolapse)    . RA (rheumatoid arthritis) (HCC)      FOLLOWED BY DR. SHANAHAN  . Raynaud's disease    . Scleroderma ([place])    . Seasonal allergies    . Thrombocytopenia ([place]) 07/01/2016    Acute fall to 13,000 07/01/16  . Tubular adenoma 01/08/2008    CECUM  . Vitamin D deficiency             Past Surgical History:  Procedure Laterality Date  . ANKLE FRACTURE SURGERY Right    . AV FISTULA PLACEMENT Left 06/28/2016    Procedure: left arm ARTERIOVENOUS (AV) FISTULA CREATION;  Surgeon: Rosetta Posner, MD;  Location: [place];  Service: Vascular;  Laterality: Left;  . BASCILIC VEIN TRANSPOSITION Left 09/27/2016    Procedure: LEFT UPPER ARM CEPHALIC VEIN TRANSPOSITION;  Surgeon: Rosetta Posner, MD;  Location: [place];  Service: Vascular;  Laterality: Left;  . BREAST  BIOPSY        "? side"  . CESAREAN SECTION   1994  . CO2 LASER OF CERVIX      . COLONOSCOPY W/ BIOPSIES   01/08/2008  . INSERTION OF DIALYSIS CATHETER Right 06/28/2016    Procedure: INSERTION OF DIALYSIS CATHETER, right internal jugular;  Surgeon: Rosetta Posner, MD;  Location: [place];  Service: Vascular;  Laterality: Right;  . MYOMECTOMY      . PELVIC LAPAROSCOPY   2011  . superficial thrombophlebitis Left 07-2014           Family History  Problem Relation Age of Onset  . Hypertension Mother    . Diabetes Mother    .  Heart disease Father    . Hypertension Maternal Aunt    . Diabetes Maternal Grandmother    . Heart disease Paternal Grandfather    . Cerebral palsy Cousin          1ST COUSIN?  . Diabetes Paternal Grandmother        Social History: Married--husband in good health. 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 She was using a cane "sparingly" after leg surgery. Church has been helping with meals for the past months. She reports that she has never smoked. She has never used smokeless tobacco. She reports that she does not drink alcohol and does not use drugs.          Allergies  Allergen Reactions  . Other Anaphylaxis      Do not use polyflux membrane.  Use alternate  . Savella [Milnacipran Hcl] Palpitations and Other (See Comments)      Unknown  . Tape Rash and Other (See Comments)      Itch- unsure if it was paper or adhesive tape            Medications Prior to Admission  Medication Sig Dispense Refill  . ambrisentan (LETAIRIS) 5 MG tablet Take 5 mg by mouth daily.       . clonazePAM (KLONOPIN) 0.5 MG tablet Take 0.5 mg by mouth 2 (two) times daily as needed for anxiety.       . hydrocortisone 2.5 % cream Apply 1 application topically 2 (two) times daily.      Marland Kitchen lidocaine-prilocaine (EMLA) cream Apply 1 application topically as needed.      . linaclotide (LINZESS) 72 MCG capsule Take 72 mcg by mouth daily before breakfast.      . pentoxifylline (TRENTAL) 400 MG CR tablet Take 400 mg by mouth 3 (three) times daily with meals.      . Selexipag (UPTRAVI) 200 MCG TABS Take 1 tablet by mouth in the morning and at bedtime.       Marland Kitchen TADALAFIL PO Take 40 mg by mouth daily.       Marland Kitchen warfarin (COUMADIN) 5 MG tablet Take 7.5 mg by mouth daily.      Marland Kitchen acetaminophen (TYLENOL) 500 MG tablet Take 500-1,000 mg by mouth every 6 (six) hours as needed (for pain/headaches.).       Marland Kitchen calcium acetate (PHOSLO) 667 MG capsule Take 667 mg by mouth 5 (five) times daily.      . cephALEXin (KEFLEX) 500 MG capsule Take  1 capsule (500 mg total) by mouth 2 (two) times daily. On dialysis days, take dose after dialysis. 28 capsule 0  . cinacalcet (SENSIPAR) 30 MG tablet Take 30 mg by mouth at bedtime.       . clotrimazole-betamethasone (LOTRISONE) cream Apply 1 application topically  2 (two) times daily. Apply to itchy area under toes twice a day 45 g 2  . cyclobenzaprine (FLEXERIL) 5 MG tablet Take 1 tablet (5 mg total) by mouth 3 (three) times daily as needed for muscle spasms. (Patient taking differently: Take 10 mg by mouth 3 (three) times daily as needed for muscle spasms. ) 30 tablet 0  . docusate sodium (COLACE) 100 MG capsule Take 2 capsules (200 mg total) by mouth 2 (two) times daily. (Patient taking differently: Take 200 mg by mouth 2 (two) times daily as needed (for constipation). ) 10 capsule 0  . ferric citrate (AURYXIA) 1 GM 210 MG(Fe) tablet Take 420 mg by mouth 3 (three) times daily with meals.       . fluticasone (FLONASE) 50 MCG/ACT nasal spray Place 1 spray into the nose daily.      Marland Kitchen gabapentin (NEURONTIN) 100 MG capsule Take 100 mg by mouth daily as needed (pain).       Marland Kitchen gentamicin ointment (GARAMYCIN) 0.1 % Apply to toe after soaking twice daily 30 g 0  . hydrOXYzine (ATARAX/VISTARIL) 25 MG tablet Take 25 mg by mouth every 8 (eight) hours as needed.      Marland Kitchen ipratropium (ATROVENT) 0.06 % nasal spray Place 2 sprays into both nostrils 4 (four) times daily. (Patient not taking: Reported on 08/27/2019)      . lactulose (CHRONULAC) 10 GM/15ML solution SMARTSIG:15 Milliliter(s) By Mouth 3-4 Times Daily      . levocetirizine (XYZAL) 5 MG tablet Take 5 mg by mouth every evening.      . loperamide (IMODIUM) 2 MG capsule Take 2-4 mg by mouth 3 (three) times daily as needed for diarrhea or loose stools.      . lubiprostone (AMITIZA) 8 MCG capsule Take 8 mcg by mouth in the morning and at bedtime.      . metoprolol succinate (TOPROL-XL) 25 MG 24 hr tablet Take 12.5 mg by mouth daily.      . metoprolol tartrate  (LOPRESSOR) 25 MG tablet Take by mouth. (Patient not taking: Reported on 08/27/2019)      . montelukast (SINGULAIR) 10 MG tablet Take 10 mg by mouth daily as needed (allergy symptoms).  (Patient not taking: Reported on 08/27/2019)   1  . multivitamin (RENA-VIT) TABS tablet Take 1 tablet by mouth daily.  (Patient not taking: Reported on 08/27/2019)      . pantoprazole (PROTONIX) 40 MG tablet TAKE 1 TABLET(40 MG) BY MOUTH DAILY (Patient not taking: Reported on 08/27/2019) 90 tablet 1  . phenylephrine (NEO-SYNEPHRINE) 0.5 % nasal solution Place 1 drop into both nostrils every 6 (six) hours as needed for congestion. 15 mL 1  . sevelamer carbonate (RENVELA) 800 MG tablet Take by mouth.      . Spacer/Aero-Holding Chambers DEVI Use spacer with albuterol 1 each 0  . tadalafil, PAH, (ADCIRCA) 20 MG tablet Take 20 mg by mouth every other day.          Home: Home Living Family/patient expects to be discharged to:: Private residence Living Arrangements: Spouse/significant other Available Help at Discharge: Family Type of Home: House Home Access: Stairs to enter [employer] of Steps: 1 to enter garage, 2 at front (rails at front but pt reports she uses garage) Home Layout: Multi-level Alternate Level Stairs-Number of Steps: 12-15 Alternate Level Stairs-Rails: Left (wall on R) Bathroom Shower/Tub: Walk-in shower, Tub only Biochemist, clinical: Handicapped height Home Equipment: Civil engineer, contracting, Bedside commode, Cane - single point Additional Comments: spouse present  to provide info.  pt has been sleeping on living room couch due to difficulty with stairs for 6-9 months.   Lives With: Spouse, Son  Functional History: Prior Function Level of Independence: Needs assistance Gait / Transfers Assistance Needed: assistance for stairs, use of cane intermittently ADL's / Homemaking Assistance Needed: pt stands to take shower, supervision for safety. assist for LB dressing to complete ADLs quickly, pt can do  some independently. pt still driving short distances Comments: Pt reports she drives self to HD.  She uses 2L during day, 3L O2 at night (pt states she leaves it on 2L: most days and nights) Functional Status:  Mobility: Bed Mobility Overal bed mobility: Needs Assistance Bed Mobility: Supine to Sit Supine to sit: Mod assist, +2 for safety/equipment General bed mobility comments: assist to move Rt LE off the bed, assist to roll to Lt and assist to lift trunk  Transfers Overall transfer level: Needs assistance Equipment used: 2 person hand held assist Transfers: Sit to/from Stand, Stand Pivot Transfers Sit to Stand: Mod assist, +2 physical assistance, +2 safety/equipment Stand pivot transfers: Mod assist, +2 physical assistance, +2 safety/equipment General transfer comment: assist to boost hips from surface as well as facilitation for hip and knee extension as well as head/neck control and trunk extension. Rt knee buckled  Ambulation/Gait Ambulation/Gait assistance: Mod assist, +2 physical assistance Gait Distance (Feet): 3 Feet Assistive device: 1 person hand held assist Gait Pattern/deviations: Step-to pattern, Trunk flexed, Decreased stance time - right, Decreased dorsiflexion - right, Decreased weight shift to right General Gait Details: sig assist to maintain stability, block R knee, and facilitate wt shift to allow for steppingwith LLE.  Gait velocity: decreased Gait velocity interpretation: <1.31 ft/sec, indicative of household ambulator   ADL: ADL Overall ADL's : Needs assistance/impaired Eating/Feeding: Set up, Sitting, Bed level Grooming: Wash/dry hands, Wash/dry face, Oral care, Brushing hair, Set up, Sitting Upper Body Bathing: Moderate assistance, Sitting Lower Body Bathing: Maximal assistance, Sit to/from stand Upper Body Dressing : Maximal assistance, Sitting Lower Body Dressing: Maximal assistance, Sit to/from stand Lower Body Dressing Details (indicate cue type and  reason): able to bend forward to with mod A for balance, and mod A to pull socks on  Toilet Transfer: Moderate assistance, +2 for physical assistance, +2 for safety/equipment, Stand-pivot, BSC Toilet Transfer Details (indicate cue type and reason): Rt knee blocked, faciltiation at hips for extension, and to assist with balance and pivoting  Toileting- Clothing Manipulation and Hygiene: Total assistance, Sit to/from stand Functional mobility during ADLs: Moderate assistance, +2 for physical assistance, +2 for safety/equipment   Cognition: Cognition Overall Cognitive Status: Impaired/Different from baseline Arousal/Alertness: Awake/alert Orientation Level: Oriented X4 Attention: Focused, Sustained Focused Attention: Appears intact Sustained Attention: Appears intact Memory: Appears intact (Immediate: 5/5; delayed: 5/5; paragraph: 6/8) Awareness: Appears intact Problem Solving: Appears intact Executive Function: Reasoning, Sequencing, Organizing Reasoning: Appears intact Sequencing: Impaired Sequencing Impairment: Verbal complex (clock drawing: 2/4) Organizing: Impaired Organizing Impairment: Verbal complex (Backward digit span: 1/2) Safety/Judgment: Appears intact Cognition Arousal/Alertness: Awake/alert Behavior During Therapy: WFL for tasks assessed/performed Overall Cognitive Status: Impaired/Different from baseline Area of Impairment: Attention, Memory, Following commands, Safety/judgement, Awareness, Problem solving Current Attention Level: Selective Memory: Decreased short-term memory Following Commands: Follows one step commands consistently, Follows multi-step commands with increased time Safety/Judgement: Decreased awareness of deficits, Decreased awareness of safety Awareness: Intellectual Problem Solving: Difficulty sequencing, Requires verbal cues, Requires tactile cues General Comments: Pt requires cues for accuracy of PLOF.   She self distracts and  requires cues to  attend to task.        Blood pressure 128/65, pulse 77, temperature 98.2 F (36.8 C), temperature source Oral, resp. rate (!) 27, height 5\' 5"  (1.651 m), weight 57.4 kg, last menstrual period 11/15/2008, SpO2 95 %. Physical Exam   General: Alert and oriented x 3, No apparent distress HEENT: Head is normocephalic, atraumatic, PERRLA, EOMI, sclera anicteric, oral mucosa pink and moist, dentition intact, ext ear canals clear,  Neck: Supple without JVD or lymphadenopathy Heart: Reg rate and rhythm. No murmurs rubs or gallops Chest: On 2L oxygen per [place].   Abdomen: Soft, non-tender, non-distended, bowel sounds positive. Extremities: No clubbing, cyanosis, or edema. Pulses are 2+ Skin: Clean and intact without signs of breakdown Neuro: Pt is cognitively appropriate with normal insight, memory, and awareness. Speech clear. Dense right hemiplegia. 0/5 in right arm and leg. Left sided strength is intact.  Psych: Pt's affect is appropriate- tearful due to severity of condition but pleasant and hopeful. Pt is cooperative   Lab Results Last 24 Hours  Results for orders placed or performed during the hospital encounter of 12/12/19 (from the past 24 hour(s))  CBG monitoring, ED     Status: None    Collection Time: 12/12/19  3:25 PM  Result Value Ref Range    Glucose-Capillary 75 70 - 99 mg/dL    Comment 1 Notify RN      Comment 2 Document in Chart    Protime-INR     Status: Abnormal    Collection Time: 12/12/19  3:28 PM  Result Value Ref Range    Prothrombin Time 19.0 (H) 11.4 - 15.2 seconds    INR 1.7 (H) 0.8 - 1.2  APTT     Status: Abnormal    Collection Time: 12/12/19  3:28 PM  Result Value Ref Range    aPTT 38 (H) 24 - 36 seconds  CBC     Status: Abnormal    Collection Time: 12/12/19  3:28 PM  Result Value Ref Range    WBC 3.3 (L) 4.0 - 10.5 K/uL    RBC 3.65 (L) 3.87 - 5.11 MIL/uL    Hemoglobin 11.1 (L) 12.0 - 15.0 g/dL    HCT 34.6 (L) 36 - 46 %    MCV 94.8 80.0 - 100.0 fL    MCH 30.4  26.0 - 34.0 pg    MCHC 32.1 30.0 - 36.0 g/dL    RDW 17.9 (H) 11.5 - 15.5 %    Platelets 131 (L) 150 - 400 K/uL    nRBC 0.0 0.0 - 0.2 %  Differential     Status: None    Collection Time: 12/12/19  3:28 PM  Result Value Ref Range    Neutrophils Relative % 55 %    Neutro Abs 1.8 1.7 - 7.7 K/uL    Lymphocytes Relative 23 %    Lymphs Abs 0.8 0.7 - 4.0 K/uL    Monocytes Relative 18 %    Monocytes Absolute 0.6 0.1 - 1.0 K/uL    Eosinophils Relative 2 %    Eosinophils Absolute 0.1 0.0 - 0.5 K/uL    Basophils Relative 2 %    Basophils Absolute 0.1 0.0 - 0.1 K/uL    Immature Granulocytes 0 %    Abs Immature Granulocytes 0.01 0.00 - 0.07 K/uL  Comprehensive metabolic panel     Status: Abnormal    Collection Time: 12/12/19  3:28 PM  Result Value Ref Range    Sodium 140 135 -  145 mmol/L    Potassium 4.7 3.5 - 5.1 mmol/L    Chloride 99 98 - 111 mmol/L    CO2 27 22 - 32 mmol/L    Glucose, Bld 91 70 - 99 mg/dL    BUN 42 (H) 8 - 23 mg/dL    Creatinine, Ser 6.18 (H) 0.44 - 1.00 mg/dL    Calcium 8.8 (L) 8.9 - 10.3 mg/dL    Total Protein 7.2 6.5 - 8.1 g/dL    Albumin 2.9 (L) 3.5 - 5.0 g/dL    AST 35 15 - 41 U/L    ALT 25 0 - 44 U/L    Alkaline Phosphatase 152 (H) 38 - 126 U/L    Total Bilirubin 0.6 0.3 - 1.2 mg/dL    GFR, Estimated 7 (L) >60 mL/min    Anion gap 14 5 - 15  HIV Antibody (routine testing w rflx)     Status: None    Collection Time: 12/12/19  8:42 PM  Result Value Ref Range    HIV Screen 4th Generation wRfx Non Reactive Non Reactive  Hemoglobin A1c     Status: None    Collection Time: 12/12/19  8:42 PM  Result Value Ref Range    Hgb A1c MFr Bld 5.2 4.8 - 5.6 %    Mean Plasma Glucose 102.54 mg/dL  Lipid panel     Status: None    Collection Time: 12/12/19  8:43 PM  Result Value Ref Range    Cholesterol 194 0 - 200 mg/dL    Triglycerides 48 <150 mg/dL    HDL 105 >40 mg/dL    Total CHOL/HDL Ratio 1.8 RATIO    VLDL 10 0 - 40 mg/dL    LDL Cholesterol 79 0 - 99 mg/dL  MRSA  PCR Screening     Status: None    Collection Time: 12/12/19  9:15 PM    Specimen: Nasal Mucosa; Nasopharyngeal  Result Value Ref Range    MRSA by PCR NEGATIVE NEGATIVE  Resp Panel by RT-PCR (Flu A&B, Covid) Nasopharyngeal Swab     Status: None    Collection Time: 12/12/19 11:39 PM    Specimen: Nasopharyngeal Swab; Nasopharyngeal(NP) swabs in vial transport medium  Result Value Ref Range    SARS Coronavirus 2 by RT PCR NEGATIVE NEGATIVE    Influenza A by PCR NEGATIVE NEGATIVE    Influenza B by PCR NEGATIVE NEGATIVE  Protime-INR     Status: None    Collection Time: 12/13/19 12:06 AM  Result Value Ref Range    Prothrombin Time 15.1 11.4 - 15.2 seconds    INR 1.2 0.8 - 1.2  Glucose, capillary     Status: None    Collection Time: 12/13/19  1:12 AM  Result Value Ref Range    Glucose-Capillary 74 70 - 99 mg/dL  Glucose, capillary     Status: Abnormal    Collection Time: 12/13/19  4:01 AM  Result Value Ref Range    Glucose-Capillary 57 (L) 70 - 99 mg/dL  Glucose, capillary     Status: None    Collection Time: 12/13/19  5:06 AM  Result Value Ref Range    Glucose-Capillary 93 70 - 99 mg/dL  Glucose, capillary     Status: Abnormal    Collection Time: 12/13/19  8:06 AM  Result Value Ref Range    Glucose-Capillary 69 (L) 70 - 99 mg/dL  CBC     Status: Abnormal    Collection Time: 12/13/19  8:28 AM  Result  Value Ref Range    WBC 3.8 (L) 4.0 - 10.5 K/uL    RBC 3.88 3.87 - 5.11 MIL/uL    Hemoglobin 11.5 (L) 12.0 - 15.0 g/dL    HCT 36.7 36 - 46 %    MCV 94.6 80.0 - 100.0 fL    MCH 29.6 26.0 - 34.0 pg    MCHC 31.3 30.0 - 36.0 g/dL    RDW 18.4 (H) 11.5 - 15.5 %    Platelets 131 (L) 150 - 400 K/uL    nRBC 0.0 0.0 - 0.2 %  Glucose, capillary     Status: None    Collection Time: 12/13/19  9:51 AM  Result Value Ref Range    Glucose-Capillary 73 70 - 99 mg/dL  Renal function panel     Status: Abnormal    Collection Time: 12/13/19 10:18 AM  Result Value Ref Range    Sodium 141 135 - 145  mmol/L    Potassium 5.1 3.5 - 5.1 mmol/L    Chloride 100 98 - 111 mmol/L    CO2 24 22 - 32 mmol/L    Glucose, Bld 78 70 - 99 mg/dL    BUN 51 (H) 8 - 23 mg/dL    Creatinine, Ser 7.48 (H) 0.44 - 1.00 mg/dL    Calcium 8.9 8.9 - 10.3 mg/dL    Phosphorus 7.7 (H) 2.5 - 4.6 mg/dL    Albumin 2.8 (L) 3.5 - 5.0 g/dL    GFR, Estimated 6 (L) >60 mL/min    Anion gap 17 (H) 5 - 15  Glucose, capillary     Status: Abnormal    Collection Time: 12/13/19 11:38 AM  Result Value Ref Range    Glucose-Capillary 110 (H) 70 - 99 mg/dL       Imaging Results (Last 48 hours)  DG Skull 1-3 Views   Result Date: 12/12/2019 CLINICAL DATA:  Encounter for foreign body in soft tissue. Left arm restricted for dialysis. Went to bed at midnight, woke up at 1 pm today. Flaccid right arm and leg. No vision changes. No facial droop. No withdrawal from on right leg and arm but can feel the pain. Pt does not make urine. No other complaints, headache, or dizziness. EXAM: SKULL - 1-3 VIEW COMPARISON:  None. FINDINGS: No skull fracture or lesion. No radiopaque foreign body.  Soft tissues are unremarkable. IMPRESSION: Negative. Electronically Signed   By: Lajean Manes M.D.   On: 12/12/2019 16:59    CT HEAD WO CONTRAST   Result Date: 12/13/2019 CLINICAL DATA:  Right-sided weakness EXAM: CT HEAD WITHOUT CONTRAST TECHNIQUE: Contiguous axial images were obtained from the base of the skull through the vertex without intravenous contrast. COMPARISON:  12/12/2019 at 6:21 p.m. FINDINGS: Brain: Unchanged size of intraparenchymal hematoma centered in the left frontal lobe measuring 1.7 x 3.1 cm. Surrounding edema is unchanged. The small adjacent satellite hemorrhage is also unchanged. No new site of hemorrhage. No midline shift or other mass effect. Vascular: No hyperdense vessel or unexpected calcification. Skull: Normal. Negative for fracture or focal lesion. Sinuses/Orbits: No acute finding. Other: None. IMPRESSION: Unchanged size of  intraparenchymal hematoma centered in the left frontal lobe with surrounding edema. Electronically Signed   By: Ulyses Jarred M.D.   On: 12/13/2019 03:42    CT HEAD WO CONTRAST   Result Date: 12/12/2019 CLINICAL DATA:  Extremity weakness, stroke, intracranial hemorrhage EXAM: CT HEAD WITHOUT CONTRAST TECHNIQUE: Contiguous axial images were obtained from the base of the skull through the vertex  without intravenous contrast. COMPARISON:  12/12/2019 at 5:05 p.m. FINDINGS: Brain: The left frontal intraparenchymal hemorrhage seen previously is again identified, measuring 3.1 x 1.7 by 2.9 cm, not significantly changed since prior study. Smaller adjacent 0.5 cm focus of hemorrhage is also unchanged. Mild surrounding edema with regional mass effect is stable. No midline shift. Lateral ventricles and midline structures are unremarkable. Vascular: Stable atherosclerosis.  No hyperdense vessel. Skull: Normal. Negative for fracture or focal lesion. Sinuses/Orbits: Stable right maxillary sinus mucosal thickening. Other: Stable calcifications within the bilateral parotid glands. IMPRESSION: 1. Stable left frontal intraparenchymal hemorrhage and mild surrounding edema. Electronically Signed   By: Randa Ngo M.D.   On: 12/12/2019 18:51    CT HEAD WO CONTRAST   Result Date: 12/12/2019 CLINICAL DATA:  Right-sided weakness EXAM: CT HEAD WITHOUT CONTRAST TECHNIQUE: Contiguous axial images were obtained from the base of the skull through the vertex without intravenous contrast. COMPARISON:  None. FINDINGS: Brain: There is acute parenchymal hemorrhage within the posterior left frontal lobe involving the superior frontal and precentral gyri. This measures approximately 2.9 x 1.7 x 2.9 cm. Adjacent subcentimeter focus of hemorrhage. Mild surrounding edema with minor regional mass effect. Gray-white differentiation is preserved. Ventricles and sulci are within normal limits in size and configuration. Vascular: There is  atherosclerotic calcification at the skull base. Skull: Calvarium is unremarkable. Sinuses/Orbits: No acute finding. Other: Mastoid air cells are clear. Multiple punctate bilateral parotid calculi. IMPRESSION: Acute left frontal parenchymal hemorrhage with mild edema. No significant mass effect. Critical Value/emergent results were called by telephone at the time of interpretation on 12/12/2019 at 5:17 pm to provider [facility] , who verbally acknowledged these results. Electronically Signed   By: Macy Mis M.D.   On: 12/12/2019 17:22    MR ANGIO HEAD WO CONTRAST   Result Date: 12/13/2019 CLINICAL DATA:  Thrombocytopenia. EXAM: MRI HEAD WITHOUT CONTRAST MRA HEAD WITHOUT CONTRAST TECHNIQUE: Multiplanar, multiecho pulse sequences of the brain and surrounding structures were obtained without intravenous contrast. Angiographic images of the head were obtained using MRA technique without contrast. COMPARISON:  Head CT from earlier today and yesterday FINDINGS: MRI HEAD FINDINGS Brain: T1 and T2 hyperintense hematoma in the high subcortical posterior left frontal lobe which measures 3 x 1.8 cm on axial slices. Subcentimeter adjacent hemorrhage is present within a single rim of vasogenic edema. No underlying infarct is seen. No gross masslike finding detected. There are other remote microhemorrhages with nodular hemosiderin staining seen in the parasagittal right frontal lobe, bilateral anterior frontal white matter, and along the high and lateral left frontal lobe. No generalized chronic white matter disease. No hydrocephalus or extra-axial collection. Vascular: Arterial findings below. Normal dural venous sinus flow voids. Skull and upper cervical spine: Hypointense cervical marrow with mild clivus heterogeneity, likely red marrow reconversion in this dialysis patient. Sinuses/Orbits: Proteinaceous retention cysts in the right maxillary sinus. Other: Nodular appearance of the bilateral parotid glands with a 9  mm T1 hyperintense mass in the superficial left parotid. Small T2 hyperintense cysts in the right parotid. There is known history of scleroderma. MRA HEAD FINDINGS Vessels are smooth and widely patent.  No aneurysm or stenosis. IMPRESSION: Brain MRI: Known acute hemorrhage in the subcortical left frontal white matter with rim of edema. A few remote hemorrhages have also occurred in the bilateral upper cerebrum suggesting underlying vasculopathy or bleeding diathesis. Notable lack of chronic white matter disease. Intracranial MRA: Negative. Electronically Signed   By: Monte Fantasia M.D.   On: 12/13/2019  07:57    MR BRAIN WO CONTRAST   Result Date: 12/13/2019 CLINICAL DATA:  Thrombocytopenia. EXAM: MRI HEAD WITHOUT CONTRAST MRA HEAD WITHOUT CONTRAST TECHNIQUE: Multiplanar, multiecho pulse sequences of the brain and surrounding structures were obtained without intravenous contrast. Angiographic images of the head were obtained using MRA technique without contrast. COMPARISON:  Head CT from earlier today and yesterday FINDINGS: MRI HEAD FINDINGS Brain: T1 and T2 hyperintense hematoma in the high subcortical posterior left frontal lobe which measures 3 x 1.8 cm on axial slices. Subcentimeter adjacent hemorrhage is present within a single rim of vasogenic edema. No underlying infarct is seen. No gross masslike finding detected. There are other remote microhemorrhages with nodular hemosiderin staining seen in the parasagittal right frontal lobe, bilateral anterior frontal white matter, and along the high and lateral left frontal lobe. No generalized chronic white matter disease. No hydrocephalus or extra-axial collection. Vascular: Arterial findings below. Normal dural venous sinus flow voids. Skull and upper cervical spine: Hypointense cervical marrow with mild clivus heterogeneity, likely red marrow reconversion in this dialysis patient. Sinuses/Orbits: Proteinaceous retention cysts in the right maxillary sinus.  Other: Nodular appearance of the bilateral parotid glands with a 9 mm T1 hyperintense mass in the superficial left parotid. Small T2 hyperintense cysts in the right parotid. There is known history of scleroderma. MRA HEAD FINDINGS Vessels are smooth and widely patent.  No aneurysm or stenosis. IMPRESSION: Brain MRI: Known acute hemorrhage in the subcortical left frontal white matter with rim of edema. A few remote hemorrhages have also occurred in the bilateral upper cerebrum suggesting underlying vasculopathy or bleeding diathesis. Notable lack of chronic white matter disease. Intracranial MRA: Negative. Electronically Signed   By: Monte Fantasia M.D.   On: 12/13/2019 07:57       Assessment/Plan: Diagnosis: Left frontal IPH 2/2 warfarin coagulopathy 1. Does the need for close, 24 hr/day medical supervision in concert with the patient's rehab needs make it unreasonable for this patient to be served in a less intensive setting? Yes 2. Co-Morbidities requiring supervision/potential complications: atrial fibrillation, HLD, ESRD, HTN, OSA 3. Due to bladder management, bowel management, safety, skin/wound care, disease management, medication administration, pain management and patient education, does the patient require 24 hr/day rehab nursing? Yes 4. Does the patient require coordinated care of a physician, rehab nurse, therapy disciplines of PT, OT to address physical and functional deficits in the context of the above medical diagnosis(es)? Yes Addressing deficits in the following areas: balance, endurance, locomotion, strength, transferring, bowel/bladder control, bathing, dressing, feeding, grooming, toileting and psychosocial support 5. Can the patient actively participate in an intensive therapy program of at least 3 hrs of therapy per day at least 5 days per week? Yes 6. The potential for patient to make measurable gains while on inpatient rehab is excellent 7. Anticipated functional outcomes upon  discharge from inpatient rehab are min assist  with PT, min assist with OT, independent with SLP. 8. Estimated rehab length of stay to reach the above functional goals is: 18- 20 days 9. Anticipated discharge destination: Home 10. Overall Rehab/Functional Prognosis: excellent   RECOMMENDATIONS: This patient's condition is appropriate for continued rehabilitative care in the following setting: CIR Patient has agreed to participate in recommended program. Yes Note that insurance prior authorization may be required for reimbursement for recommended care.   Comment: Leeroy Cha, MD Bary Leriche, PA-C 12/13/2019

## 2019-12-18 NOTE — Progress Notes (Signed)
[facility] NEPHROLOGY PROGRESS NOTE  Assessment/ Plan: Pt is a 64 y.o. yo female with ESRD on HD presented with right hemiplegia secondary to  [place].  HD orders: [place] T, Th,S 3.5 hrs 450/600 2.0 K/2.25 Ca 58.5 kg UFP 2 linear Na  L AVF -No Heparin -Mircera 50 mcg IV q 4 weeks -Hectorol 6 mcg IV TIW -Sensipar 30 mg PO TIW  # ICH-thought secondary to fairly new Coumadin therapy. Coumadin was held, reversing agents given, neurology managing. Repeat CT of head 12/02 without evidence of extension of hematoma.Plans for CIR for rehab.   #ESRD:We will continue with TTS schedule. HD today, tolerating well.   #Hypertension/Volume- No evidence of volume overload. UF as tol next HD.   Monitor blood pressure.  #Anemia of ESRD:Hgb 11.5. No ESA needed.  #Secondary hyperparathyroidism: Phosphorus level elevated.  Increased PhosLo.  Continue Sensipar and Hectorol.  #pulmonary HTN. Follows [facility]ology [facility]. Currently on tadalafil and Uptravi for her pulmonary hypertension.Marland Kitchen Avoid hypotension.   Subjective: Seen and examined.  Tolerating dialysis well.  Denies nausea, vomiting, chest pain or shortness of breath.  Accepted to CIR. Objective Vital signs in last 24 hours: Vitals:   12/18/19 0830 12/18/19 0900 12/18/19 0930 12/18/19 1000  BP: (!) 108/49 (!) 109/56 (!) 109/59 (!) 111/58  Pulse:      Resp: (!) 21 18 20    Temp:      TempSrc:      SpO2: 99% 99%    Weight:      Height:       Weight change:  No intake or output data in the 24 hours ending 12/18/19 1018     Labs: Basic Metabolic Panel: Recent Labs  Lab 12/13/19 1018 12/16/19 1717 12/18/19 0635  NA 141 134* 136  K 5.1 3.4* 3.6  CL 100 93* 96*  CO2 24 28 26   GLUCOSE 78 178* 90  BUN 51* 26* 46*  CREATININE 7.48* 4.78* 7.27*  CALCIUM 8.9 8.5* 8.7*  PHOS 7.7*  --  6.7*   Liver Function Tests: Recent Labs  Lab 12/12/19 1528 12/13/19 1018 12/18/19 0635  AST 35  --   --   ALT 25  --   --    ALKPHOS 152*  --   --   BILITOT 0.6  --   --   PROT 7.2  --   --   ALBUMIN 2.9* 2.8* 2.6*   No results for input(s): LIPASE, AMYLASE in the last 168 hours. No results for input(s): AMMONIA in the last 168 hours. CBC: Recent Labs  Lab 12/12/19 1528 12/13/19 0828 12/18/19 0635  WBC 3.3* 3.8* 2.8*  NEUTROABS 1.8  --   --   HGB 11.1* 11.5* 10.4*  HCT 34.6* 36.7 32.5*  MCV 94.8 94.6 92.1  PLT 131* 131* 105*   Cardiac Enzymes: No results for input(s): CKTOTAL, CKMB, CKMBINDEX, TROPONINI in the last 168 hours. CBG: Recent Labs  Lab 12/16/19 1737 12/16/19 1740 12/16/19 2200 12/17/19 0617 12/17/19 1144  GLUCAP 11* 119* 87 81 109*    Iron Studies: No results for input(s): IRON, TIBC, TRANSFERRIN, FERRITIN in the last 72 hours. Studies/Results: No results found.  Medications: Infusions: . sodium chloride    . sodium chloride    . sodium chloride    . sodium chloride      Scheduled Medications: . ambrisentan  5 mg Oral Daily  . calcium acetate  1,334 mg Oral TID WC  . Chlorhexidine Gluconate Cloth  6 each Topical  Daily  . Chlorhexidine Gluconate Cloth  6 each Topical Q0600  . Chlorhexidine Gluconate Cloth  6 each Topical Q0600  . cinacalcet  30 mg Oral Q T,Th,Sa-HD  . doxercalciferol  6 mcg Intravenous Q T,Th,Sa-HD  . fluticasone  1 spray Each Nare Daily  . loratadine  10 mg Oral Daily  . pantoprazole  40 mg Oral Daily  . pentoxifylline  400 mg Oral Daily  . Selexipag  800 mcg Oral BID  . senna-docusate  1 tablet Oral BID  . tadalafil  20 mg Oral Daily    have reviewed scheduled and prn medications.  Physical Exam: General:NAD, comfortable Heart:RRR, s1s2 nl Lungs: Clear b/l, no crackle Abdomen:soft, Non-tender, non-distended Extremities:No edema Dialysis Access: Left upper extremity AV fistula has good thrill.  Jamaul Heist [name] Amjad Fikes 12/18/2019,10:18 AM  LOS: 6 days  Pager: [phone]

## 2019-12-18 NOTE — PMR Pre-admission (Signed)
PMR Admission Coordinator Pre-Admission Assessment  Patient: Katie Nunez is an 64 y.o., female MRN: [id number] DOB: [birth year] Height: $RemoveBeforeDE'5\' 5"'cISEMPoyUzLBnYw$  (165.1 cm) Weight: 58.5 kg              Insurance Information HMO:     PPO: yes     PCP:      IPA:      80/20:      OTHER:  PRIMARY: U[employer] Medicare      Policy#: [id number]      Subscriber: pt CM Name: Frances Furbish      Phone#: [phone]     Fax#: [phone] Pre-Cert#: [id number] [place] for CIR provided by Frances Furbish at [facility] with updates due on 12/13 to fax listed above      Employer:  Benefits:  Phone #: [phone]     Name:  Eff. Date: 01/12/19     Deduct: $0      Out of Pocket Max: $3900 (met $3900)      Life Max: n/a  CIR: $345/day for days 1-6      SNF: 20 full days Outpatient:      Co-Pay: $35/visit Home Health: 100%      Co-Pay:  DME: 80%     Co-Ins: 20% Providers: pt choice SECONDARY: n/a      Policy#:       Phone#:   Development worker, community:       Phone#:   The Therapist, art Information Summary" for patients in Inpatient Rehabilitation Facilities with attached "Privacy Act [place] Records" was provided and verbally reviewed with: Patient and Family  Emergency Contact Information Contact Information    Name Relation Home Work Mobile   Becenti Spouse [phone]  [phone]   Bettina, Warn   [place], [place] Friend   [phone]     Current Medical History  Patient Admitting Diagnosis: L frontal ICH  History of Present Illness: Katie Nunez is a 64 y.o. female with history of ESRD-HD on TTS, Raynaud's, scleroderma, achalasia, cirrhosis, PAH- oxygen dependent, A fib, who has had multiple recent admissions for cdiff colitis/portal vein thrombosis 09/2019 this fall, with addition of coumadin, as well as LLE abscess 10/2019, s/p drainage.  She was admitted on 12/12/2019 with onset of LUE/LLE weakness.  No reports of speech deficits, headaches or N/V.  She was found to have left frontal ICH  with cerebral edema and INR 1.7 admission.  The brain showed no hemorrhage subcortical left frontal white matter with a single rim of vasogenic edema and few remote hemorrhages in bilateral upper cerebellum.  Coumadin reversed with Kcentra and vitamin K and neurology recommended keeping SBP less than 140.  Follow-up CT head on 12/2 showed no change in [place] with surrounding edema. Speech therapy evaluation done revealing high-level cognitive deficits with mild impairment. Wound care consulted for management of left leg wound and recommended packing with iodoform gauze.  PT/ OT evaluation revealed right hemiplegia with impaired cognition, decreased activity tolerance and impaired balance affecting ADLs and mobility.  CIR was recommended due to functional decline.  Complete NIHSS TOTAL: 8 Glasgow Coma Scale Score: 15  Past Medical History  Past Medical History:  Diagnosis Date  . Achalasia   . Anxiety   . Dysplasia of cervix, low grade (CIN 1)   . Environmental allergies    "all year long" (12/27/2016)  . ESRD (end stage renal disease) on dialysis [facility]    "TTS; [place]" (12/27/2016)  . Fibromyalgia   . GERD (gastroesophageal reflux disease)   .  Gout   . Hypertension   . IBS (irritable bowel syndrome)   . MVP (mitral valve prolapse)   . RA (rheumatoid arthritis) (HCC)    FOLLOWED BY DR. SHANAHAN  . Raynaud's disease   . Scleroderma ([place])   . Seasonal allergies   . Thrombocytopenia ([place]) 07/01/2016   Acute fall to 13,000 07/01/16  . Tubular adenoma 01/08/2008   CECUM  . Vitamin D deficiency     Family History  family history includes Cerebral palsy in her cousin; Diabetes in her maternal grandmother, mother, and paternal grandmother; Heart disease in her father and paternal grandfather; Hypertension in her maternal aunt and mother.  Prior Rehab/Hospitalizations:  Has the patient had prior rehab or hospitalizations prior to admission? Yes  Has the patient had major surgery during  100 days prior to admission? Yes  Current Medications   Current Facility-Administered Medications:  .  0.9 %  sodium chloride infusion, 100 mL, Intravenous, PRN, Corliss Parish, MD .  0.9 %  sodium chloride infusion, 100 mL, Intravenous, PRN, Corliss Parish, MD .  0.9 %  sodium chloride infusion, 100 mL, Intravenous, PRN, Rosita Fire, MD .  0.9 %  sodium chloride infusion, 100 mL, Intravenous, PRN, Rosita Fire, MD .  acetaminophen (TYLENOL) tablet 650 mg, 650 mg, Oral, Q4H PRN, 650 mg at 12/18/19 0703 **OR** acetaminophen (TYLENOL) 160 MG/5ML solution 650 mg, 650 mg, Per Tube, Q4H PRN **OR** acetaminophen (TYLENOL) suppository 650 mg, 650 mg, Rectal, Q4H PRN, Amie Portland, MD .  alteplase (CATHFLO ACTIVASE) injection 2 mg, 2 mg, Intracatheter, Once PRN, Corliss Parish, MD .  alteplase (CATHFLO ACTIVASE) injection 2 mg, 2 mg, Intracatheter, Once PRN, Rosita Fire, MD .  ambrisentan (LETAIRIS) tablet 5 mg, 5 mg, Oral, Daily, Rosalin Hawking, MD, 5 mg at 12/17/19 1124 .  calcium acetate (PHOSLO) capsule 1,334 mg, 1,334 mg, Oral, TID WC, Rosita Fire, MD, 1,334 mg at 12/17/19 1659 .  Chlorhexidine Gluconate Cloth 2 % PADS 6 each, 6 each, Topical, Daily, Amie Portland, MD, 6 each at 12/16/19 1026 .  Chlorhexidine Gluconate Cloth 2 % PADS 6 each, 6 each, Topical, Q0600, Corliss Parish, MD, 6 each at 12/18/19 0532 .  Chlorhexidine Gluconate Cloth 2 % PADS 6 each, 6 each, Topical, Q0600, Rosita Fire, MD, 6 each at 12/18/19 0532 .  cinacalcet (SENSIPAR) tablet 30 mg, 30 mg, Oral, Q T,Th,Sa-HD, Corliss Parish, MD, 30 mg at 12/15/19 1015 .  clonazePAM (KLONOPIN) tablet 0.5 mg, 0.5 mg, Oral, BID PRN, Rosalin Hawking, MD, 0.5 mg at 12/17/19 2139 .  doxercalciferol (HECTOROL) injection 6 mcg, 6 mcg, Intravenous, Q T,Th,Sa-HD, Wilson Singer I, RPH, 6 mcg at 12/18/19 6314 .  famotidine (PEPCID) tablet 20 mg, 20 mg, Oral, BID PRN, Rosalin Hawking, MD .  fluticasone (FLONASE) 50 MCG/ACT nasal spray 1 spray, 1 spray, Each Nare, Daily, Hosie Poisson, MD, 1 spray at 12/17/19 1220 .  heparin injection 1,000 Units, 1,000 Units, Dialysis, PRN, Corliss Parish, MD .  heparin injection 1,000 Units, 1,000 Units, Dialysis, PRN, Rosita Fire, MD .  hydrocortisone (ANUSOL-HC) 2.5 % rectal cream, , Rectal, BID PRN, Shalhoub, Sherryll Burger, MD, Given at 12/18/19 0027 .  hydrOXYzine (ATARAX/VISTARIL) tablet 25 mg, 25 mg, Oral, Q8H PRN, Vernelle Emerald, MD, 25 mg at 12/18/19 0703 .  labetalol (NORMODYNE) injection 5-10 mg, 5-10 mg, Intravenous, Q2H PRN, Rosalin Hawking, MD .  lidocaine (PF) (XYLOCAINE) 1 % injection 5 mL, 5 mL, Intradermal, PRN, Corliss Parish, MD .  lidocaine (PF) (XYLOCAINE) 1 % injection 5 mL, 5 mL, Intradermal, PRN, Maxie Barb, MD .  lidocaine-prilocaine (EMLA) cream 1 application, 1 application, Topical, PRN, Annie Sable, MD .  lidocaine-prilocaine (EMLA) cream 1 application, 1 application, Topical, PRN, Maxie Barb, MD .  loratadine (CLARITIN) tablet 10 mg, 10 mg, Oral, Daily, Kathlen Mody, MD, 10 mg at 12/17/19 1123 .  pantoprazole (PROTONIX) EC tablet 40 mg, 40 mg, Oral, Daily, Kathlen Mody, MD, 40 mg at 12/17/19 1122 .  pentafluoroprop-tetrafluoroeth (GEBAUERS) aerosol 1 application, 1 application, Topical, PRN, Annie Sable, MD .  pentafluoroprop-tetrafluoroeth (GEBAUERS) aerosol 1 application, 1 application, Topical, PRN, Maxie Barb, MD .  pentoxifylline (TRENTAL) CR tablet 400 mg, 400 mg, Oral, Daily, Kathlen Mody, MD .  Selexipag TABS 800 mcg - Patient Supplied Med, 800 mcg, Oral, BID, Hammons, Gerhard Munch, RPH, 800 mcg at 12/17/19 2139 .  senna-docusate (Senokot-S) tablet 1 tablet, 1 tablet, Oral, BID, Milon Dikes, MD, 1 tablet at 12/17/19 2140 .  sodium chloride (OCEAN) 0.65 % nasal spray 1 spray, 1 spray, Each Nare, PRN, Kathlen Mody, MD .   tadalafil (CIALIS) tablet 20 mg, 20 mg, Oral, Daily, Marvel Plan, MD, 20 mg at 12/17/19 1123 .  traMADol (ULTRAM) tablet 50 mg, 50 mg, Oral, Q12H PRN, Marvel Plan, MD, 50 mg at 12/18/19 0030  Patients Current Diet:  Diet Order            Diet renal with fluid restriction Fluid restriction: 1200 mL Fluid; Room service appropriate? Yes with Assist; Fluid consistency: Thin  Diet effective now                 Precautions / Restrictions Precautions Precautions: Fall Precaution Comments: Rt hemiplegia  Restrictions Weight Bearing Restrictions: No   Has the patient had 2 or more falls or a fall with injury in the past year?Yes  Prior Activity Level Limited Community (1-2x/wk): had been driving herself to dialysis, mobility was more difficult in the recent months, on O2 at baseline  Prior Functional Level Prior Function Level of Independence: Needs assistance Gait / Transfers Assistance Needed: assistance for stairs, use of cane intermittently ADL's / Homemaking Assistance Needed: pt stands to take shower, supervision for safety. assist for LB dressing to complete ADLs quickly, pt can do some independently. pt still driving short distances Comments: Pt reports she drives self to HD.  She uses 2L during day, 3L O2 at night (pt states she leaves it on 2L: most days and nights)  Self Care: Did the patient need help bathing, dressing, using the toilet or eating?  Independent  Indoor Mobility: Did the patient need assistance with walking from room to room (with or without device)? Independent  Stairs: Did the patient need assistance with internal or external stairs (with or without device)? Needed some help  Functional Cognition: Did the patient need help planning regular tasks such as shopping or remembering to take medications? Independent  Home Banker / Equipment Home Assistive Devices/Equipment: None Home Equipment: Shower seat, Bedside commode, Cane - single  point  Prior Device Use: Indicate devices/aids used by the patient prior to current illness, exacerbation or injury? SPC occasionally used  Current Functional Level Cognition  Arousal/Alertness: Awake/alert Overall Cognitive Status: Impaired/Different from baseline Current Attention Level: Selective Orientation Level: Oriented X4 Following Commands: Follows one step commands inconsistently, Follows one step commands with increased time Safety/Judgement: Decreased awareness of safety, Decreased awareness of deficits General Comments: fatigue of R side impedes following commands to  move R LE Attention: Focused, Sustained Focused Attention: Appears intact Sustained Attention: Appears intact Memory: Appears intact (Immediate: 5/5; delayed: 5/5; paragraph: 6/8) Awareness: Appears intact Problem Solving: Appears intact Executive Function: Reasoning, Sequencing, Organizing Reasoning: Appears intact Sequencing: Impaired Sequencing Impairment: Verbal complex (clock drawing: 2/4) Organizing: Impaired Organizing Impairment: Verbal complex (Backward digit span: 1/2) Safety/Judgment: Appears intact    Extremity Assessment (includes Sensation/Coordination)  Upper Extremity Assessment: Defer to OT evaluation RUE Deficits / Details: PROM WFL.  No active movement noted.  Flaccid  RUE Coordination: decreased fine motor, decreased gross motor  Lower Extremity Assessment: RLE deficits/detail RLE Deficits / Details: PROM WFL, no clonus noted, no active movement or trace activation.  RLE Sensation: decreased light touch (pt reports minimal difference in sensation) RLE Coordination: decreased fine motor, decreased gross motor    ADLs  Overall ADL's : Needs assistance/impaired Eating/Feeding: Set up, Sitting, Bed level Grooming: Wash/dry hands, Wash/dry face, Oral care, Brushing hair, Set up, Sitting Upper Body Bathing: Moderate assistance, Sitting Lower Body Bathing: Maximal assistance, Sit  to/from stand Upper Body Dressing : Maximal assistance, Sitting Lower Body Dressing: Moderate assistance, Sit to/from stand Lower Body Dressing Details (indicate cue type and reason): pt is able to don L sock while sitting up in bed via one hand technique, assist for R sock Toilet Transfer: Moderate assistance, Maximal assistance, +2 for safety/equipment, Stand-pivot Toilet Transfer Details (indicate cue type and reason): use of Stedy for transfer to [place] for sit<>stand at [employer] and Hygiene: Total assistance, Sit to/from stand Toileting - Clothing Manipulation Details (indicate cue type and reason): assist for pericare after BM Functional mobility during ADLs: Moderate assistance (sit<>stand at [facility]) General ADL Comments: pt continues to present with R sided weakness, decreased activity tolerance, and  impaired balance    Mobility  Overal bed mobility: Needs Assistance Bed Mobility: Supine to Sit Rolling: Mod assist, +2 for physical assistance Supine to sit: Mod assist General bed mobility comments: up in chair    Transfers  Overall transfer level: Needs assistance Equipment used: Ambulation equipment used Transfer via Lift Equipment: Stedy Transfers: Sit to/from Stand Sit to Stand: Mod assist Stand pivot transfers: Mod assist, +2 physical assistance, +2 safety/equipment General transfer comment: in chair and fatigued to restand    Ambulation / Gait / Stairs / Wheelchair Mobility  Ambulation/Gait Ambulation/Gait assistance: Mod assist, +2 physical assistance Gait Distance (Feet): 4 Feet Assistive device: 2 person hand held assist Gait Pattern/deviations: Step-to pattern, Decreased stride length, Trunk flexed, Narrow base of support General Gait Details: assist of 2 to maintain posture and upright. blocking to R knee, facilitation of wt shfit to L and modA to complete movement of RLE.  Gait velocity: decreased Gait velocity interpretation:  <1.31 ft/sec, indicative of household ambulator    Posture / Balance Dynamic Sitting Balance Sitting balance - Comments: reliant on UE support  Balance Overall balance assessment: Needs assistance Sitting-balance support: Feet supported Sitting balance-Leahy Scale: Poor Sitting balance - Comments: reliant on UE support  Postural control: Posterior lean, Right lateral lean Standing balance support: Single extremity supported, Bilateral upper extremity supported Standing balance-Leahy Scale: Poor Standing balance comment: external assist and UE support     Special needs/care consideration Dialysis: Hemodialysis Tuesday, Thursday and Saturday, Oxygen 2-3L and Skin wound on LLE prior to admission     Previous Home Environment (from acute therapy documentation) Living Arrangements: Spouse/significant other  Lives With: Spouse, Son Available Help at Discharge: Family Type of Home: House Home Layout:  Multi-level Alternate Level Stairs-Rails: Left (wall on R) Alternate Level Stairs-Number of Steps: 12-15 Home Access: Stairs to enter [employer] of Steps: 1 to enter garage, 2 at front (rails at front but pt reports she uses garage) Bathroom Shower/Tub: Walk-in shower, Tub only Biochemist, clinical: Handicapped [place]: No Additional Comments: spouse present to provide info.  pt has been sleeping on living room couch due to difficulty with stairs for 6-9 months.   Discharge Living Setting Plans for Discharge Living Setting: Patient's home Type of Home at Discharge: House Discharge Home Layout: Multi-level Alternate Level Stairs-Rails: Left Alternate Level Stairs-Number of Steps: 12-15 Discharge Home Access: Stairs to enter Entrance Stairs-Rails: None Entrance Stairs-Number of Steps: 1 Discharge Bathroom Shower/Tub: Walk-in shower Discharge Bathroom Toilet: Handicapped height Discharge Bathroom Accessibility: Yes How Accessible: Accessible via walker Does  the patient have any problems obtaining your medications?: No  Social/Family/Support Systems Patient Roles: Spouse Anticipated Caregiver: Tangia Pinard (spouse) Anticipated Caregiver's Contact Information: [phone] (cell) Ability/Limitations of Caregiver: supervision Caregiver Availability: 24/7 Discharge Plan Discussed with Primary Caregiver: Yes Is Caregiver In Agreement with Plan?: Yes Does Caregiver/Family have Issues with Lodging/Transportation while Pt is in Rehab?: No   Goals Patient/Family Goal for Rehab: PT/OT/SLP supervision to min assist Expected length of stay: 18-21 days Additional Information: per spouse, pt has been sleeping on the couch the last 6+ months due to difficulty on the stairs Pt/Family Agrees to Admission and willing to participate: Yes Program Orientation Provided & Reviewed with Pt/Caregiver Including Roles  & Responsibilities: Yes  Barriers to Discharge: Insurance for SNF coverage, Home environment access/layout  Barriers to Discharge Comments: stairs to bedroom   Decrease burden of Care through IP rehab admission: n/a  Possible need for SNF placement upon discharge: Not anticipated  Patient Condition: This patient's medical and functional status has changed since the consult dated: 12/14/19 in which the Rehabilitation Physician determined and documented that the patient's condition is appropriate for intensive rehabilitative care in an inpatient rehabilitation facility. See "History of Present Illness" (above) for medical update. Functional changes are: pt is mod assist +1 for transfers. Patient's medical and functional status update has been discussed with the Rehabilitation physician and patient remains appropriate for inpatient rehabilitation. Will admit to inpatient rehab today.  Preadmission Screen Completed By:  Michel Santee, PT, 12/18/2019 11:21 AM ______________________________________________________________________   Discussed status with  Dr. Dagoberto Ligas on 12/18/19 at 11:34 AM  and received approval for admission today.  Admission Coordinator:  Michel Santee, PT, DPT time 11:34 AM Sudie Grumbling 12/18/19

## 2019-12-18 NOTE — Progress Notes (Signed)
PT Cancellation Note  Patient Details Name: LORALEI RADCLIFFE MRN: [id number] DOB: [birth year]   Cancelled Treatment:    Reason Eval/Treat Not Completed: Patient at procedure or test/unavailable. Pt at Dialysis. Acute PT to return as able to progress mobility.  Kittie Plater, PT, DPT Acute Rehabilitation Services Pager #: [phone] Office #: [phone]    Berline Lopes 12/18/2019, 10:35 AM

## 2019-12-18 NOTE — Progress Notes (Signed)
Orthopedic Tech Progress Note Patient Details:  Katie Nunez [birth year] [id number] Called in order to [facility] for a REHAB COMBO Patient ID: [name], female   DOB: 12/19/1955, 64 y.o.   MRN: [id number]   [name] 12/18/2019, 5:45 PM

## 2019-12-18 NOTE — H&P (Signed)
Physical Medicine and Rehabilitation Admission H&P    Chief Complaint  Patient presents with  . Functional deficits due to stroke     HPI: Katie Nunez. Katie Nunez is a 64 year old RH-female with history of HTN, ESRD-TTS, Raynaud's, achalasia, cirrhosis, PAH-oxygen dependent, A fib, hospitalizations this fall for c diff colitis/portal vein thrombosis with addition of coumadin as well as LLE abscess 10/2019 s/p drainage. She was admitted on 12/12/19 with onset of LUE/LLE weakness and found to have right frontal ICH with cerebral edema. INR 1.7 at admission and coumadin reversed with Kcenral and vitamin K.Neurology recommended keeping SBP<140 and follow up CT head showed no change in ICH or edema. .   Stroke felt to be secondary to warfarin coagulopathy and Dr. Erlinda Hong recommends resuming coumadin or Eliquis on outpatient basis once ICH resolved--clinic to decide. Statin has been on hold due to hemorrhage. HD ongoing with    Patient with resultant left sided weakness with poor sitting balance. Therapy ongoing and CIR recommended due to functional decline.   Pt reports R shoulder/myofascial pain- very bothersome/tight muscles.  Also LBM yesterday but feels very constipated- also has hemorrhoids.  On Home O2 dose of 2L.  Allergic to tape, so having a lot of itching from tape on body- esp ear O2 sensor.    Review of Systems  Constitutional: Negative for chills and fever.  HENT: Negative for hearing loss and tinnitus.   Eyes: Negative for blurred vision and double vision.  Respiratory: Positive for cough (on and off) and shortness of breath.   Cardiovascular: Negative for chest pain, palpitations and leg swelling.  Gastrointestinal: Positive for blood in stool, constipation, diarrhea and heartburn (swallow seems delayed). Negative for abdominal pain and nausea.       Blood on wiping--has bouts of diarrhea alternating with constipation. Belly feels tight.   Musculoskeletal: Positive for myalgias and  neck pain.  Skin: Negative for rash.  Neurological: Positive for focal weakness. Negative for headaches.  Psychiatric/Behavioral: The patient has insomnia (chronic--stays up at nights and sleeps in the morning. ).   All other systems reviewed and are negative.    Past Medical History:  Diagnosis Date  . Achalasia   . Anxiety   . Dysplasia of cervix, low grade (CIN 1)   . Environmental allergies    "all year long" (12/27/2016)  . ESRD (end stage renal disease) on dialysis [facility])    "TTS; [place]" (12/27/2016)  . Fibromyalgia   . GERD (gastroesophageal reflux disease)   . Gout   . Hypertension   . IBS (irritable bowel syndrome)   . MVP (mitral valve prolapse)   . RA (rheumatoid arthritis) (HCC)    FOLLOWED BY DR. SHANAHAN  . Raynaud's disease   . Scleroderma ([place])   . Seasonal allergies   . Thrombocytopenia ([place]) 07/01/2016   Acute fall to 13,000 07/01/16  . Tubular adenoma 01/08/2008   CECUM  . Vitamin D deficiency     Past Surgical History:  Procedure Laterality Date  . ANKLE FRACTURE SURGERY Right   . AV FISTULA PLACEMENT Left 06/28/2016   Procedure: left arm ARTERIOVENOUS (AV) FISTULA CREATION;  Surgeon: Rosetta Posner, MD;  Location: [place];  Service: Vascular;  Laterality: Left;  . BASCILIC VEIN TRANSPOSITION Left 09/27/2016   Procedure: LEFT UPPER ARM CEPHALIC VEIN TRANSPOSITION;  Surgeon: Rosetta Posner, MD;  Location: [place];  Service: Vascular;  Laterality: Left;  . BREAST BIOPSY     "? side"  .  CESAREAN SECTION  1994  . CO2 LASER OF CERVIX    . COLONOSCOPY W/ BIOPSIES  01/08/2008  . INSERTION OF DIALYSIS CATHETER Right 06/28/2016   Procedure: INSERTION OF DIALYSIS CATHETER, right internal jugular;  Surgeon: Rosetta Posner, MD;  Location: [place];  Service: Vascular;  Laterality: Right;  . MYOMECTOMY    . PELVIC LAPAROSCOPY  2011  . superficial thrombophlebitis Left 07-2014    Family History  Problem Relation Age of Onset  . Hypertension Mother   . Diabetes  Mother   . Heart disease Father   . Hypertension Maternal Aunt   . Diabetes Maternal Grandmother   . Heart disease Paternal Grandfather   . Cerebral palsy Cousin        1ST COUSIN?  . Diabetes Paternal Grandmother     Social History: Married. 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 Has been using cane "sparingly since leg surgery". Church and friends have been helping out past month with meals. She reports that she has never smoked. She has never used smokeless tobacco. She reports that she does not drink alcohol and does not use drugs. :   Allergies  Allergen Reactions  . Other Anaphylaxis    Do not use polyflux membrane.  Use alternate  . Savella [Milnacipran Hcl] Palpitations and Other (See Comments)    Unknown  . Tape Rash and Other (See Comments)    Itch- unsure if it was paper or adhesive tape    Medications Prior to Admission  Medication Sig Dispense Refill  . ambrisentan (LETAIRIS) 5 MG tablet Take 5 mg by mouth daily.     . calcium acetate (PHOSLO) 667 MG capsule Take 667 mg by mouth See admin instructions. Take 1 capsule with each meal and with each snack.    . cinacalcet (SENSIPAR) 30 MG tablet Take 30 mg by mouth See admin instructions. Tuesday Thursday saturday    . clonazePAM (KLONOPIN) 0.5 MG tablet Take 0.5 mg by mouth 2 (two) times daily as needed for anxiety.     . docusate sodium (COLACE) 100 MG capsule Take 2 capsules (200 mg total) by mouth 2 (two) times daily. (Patient taking differently: Take 200 mg by mouth 2 (two) times daily as needed (for constipation). ) 10 capsule 0  . famotidine (PEPCID) 20 MG tablet Take 20 mg by mouth 2 (two) times daily as needed for heartburn or indigestion.    . gabapentin (NEURONTIN) 100 MG capsule Take 100 mg by mouth 3 (three) times daily as needed (pain).     . hydrOXYzine (ATARAX/VISTARIL) 25 MG tablet Take 25 mg by mouth every 8 (eight) hours as needed for itching.     . pentoxifylline (TRENTAL) 400 MG CR tablet Take 400 mg by  mouth daily.     . Selexipag (UPTRAVI) 800 MCG TABS Take 800 mcg by mouth in the morning and at bedtime.     Marland Kitchen Spacer/Aero-Holding Chambers DEVI Use spacer with albuterol 1 each 0  . tadalafil, PAH, (ADCIRCA) 20 MG tablet Take 20 mg by mouth daily.     . traMADol (ULTRAM) 50 MG tablet Take 50 mg by mouth every 12 (twelve) hours as needed for pain.    Marland Kitchen warfarin (COUMADIN) 5 MG tablet Take 10 mg by mouth daily.       Drug Regimen Review  Drug regimen was reviewed and remains appropriate with no significant issues identified  Home: Home Living Family/patient expects to be discharged to:: Private residence Living Arrangements: Spouse/significant other  Functional History:    Functional Status:  Mobility:          ADL:    Cognition: Cognition Orientation Level: Oriented X4    Physical Exam: Pulse 87, temperature 98 F (36.7 C), temperature source Oral, resp. rate 17, height 5\' 5"  (1.651 m), weight 59.8 kg, last menstrual period 11/15/2008, SpO2 92 %. Physical Exam Vitals and nursing note reviewed.  Constitutional:      Appearance: Normal appearance.     Comments: Sitting up slouched in bed; appropriate, c/o biting pain from R ear/O2 sensor, waiting for lunch, NAD  HENT:     Head: Normocephalic and atraumatic.     Comments: Partial plate- missing teeth Smile equal and tongue midline; O2 by [place] 2L    Right Ear: External ear normal.     Left Ear: External ear normal.     Nose: Nose normal. No congestion.     Mouth/Throat:     Mouth: Mucous membranes are dry.     Pharynx: No oropharyngeal exudate.  Eyes:     General:        Right eye: No discharge.        Left eye: No discharge.     Extraocular Movements: Extraocular movements intact.     Comments: No nystagmus  Neck:     Comments: Neck tight, esp R scalenes and levators and upper traps.  Cardiovascular:     Rate and Rhythm: Normal rate and regular rhythm.     Heart sounds: Normal heart sounds. No murmur heard.       Comments: RRR- no JVD Pulmonary:     Comments: CTA B/L- no W/R/R- good air movement Abdominal:     Comments: Soft, NT, ND, (+)BS -hypoactive  Musculoskeletal:     Comments: Stasis changes left shin. Incision is closed and healing well with underlying induration. Left heel tenderness reported--no bogginess or breakdown. Dry skin bilateral feet.  5/5 in LUE and LLE- biceps, triceps, WE, grip and finger abd, as well as HF, KE, KF , DF and PF RUE 0/5 in same muscles  RLE- 0/5 in same muscles   Skin:    General: Skin is warm and dry.     Comments: Thrill in L AC fossa from fistula L inner calf wound- bandaged- was previous abscess- healing Dry skin on feet- peeling some- no heel bogginess  Neurological:     Mental Status: She is alert and oriented to person, place, and time.     Comments: Delayed verbal output. Right flaccid hemiplegia.   C?o muscle spasms- appears flaccid, however saw 2-3 beats clonus 1x in RLE- however didn't repeat Intact to light touch sensation in all 4 extremities  Psychiatric:        Behavior: Behavior normal.     Comments: Flat., a little slow to process     Results for orders placed or performed during the hospital encounter of 12/12/19 (from the past 48 hour(s))  Glucose, capillary     Status: None   Collection Time: 12/16/19 10:00 PM  Result Value Ref Range   Glucose-Capillary 87 70 - 99 mg/dL    Comment: Glucose reference range applies only to samples taken after fasting for at least 8 hours.  Glucose, capillary     Status: None   Collection Time: 12/17/19  6:17 AM  Result Value Ref Range   Glucose-Capillary 81 70 - 99 mg/dL    Comment: Glucose reference range applies only to samples taken after fasting for at least  8 hours.  Glucose, capillary     Status: Abnormal   Collection Time: 12/17/19 11:44 AM  Result Value Ref Range   Glucose-Capillary 109 (H) 70 - 99 mg/dL    Comment: Glucose reference range applies only to samples taken after  fasting for at least 8 hours.   Comment 1 Notify RN    Comment 2 Document in Chart   Renal function panel     Status: Abnormal   Collection Time: 12/18/19  6:35 AM  Result Value Ref Range   Sodium 136 135 - 145 mmol/L   Potassium 3.6 3.5 - 5.1 mmol/L   Chloride 96 (L) 98 - 111 mmol/L   CO2 26 22 - 32 mmol/L   Glucose, Bld 90 70 - 99 mg/dL    Comment: Glucose reference range applies only to samples taken after fasting for at least 8 hours.   BUN 46 (H) 8 - 23 mg/dL   Creatinine, Ser 7.27 (H) 0.44 - 1.00 mg/dL    Comment: DELTA CHECK NOTED DIALYSIS    Calcium 8.7 (L) 8.9 - 10.3 mg/dL   Phosphorus 6.7 (H) 2.5 - 4.6 mg/dL   Albumin 2.6 (L) 3.5 - 5.0 g/dL   GFR, Estimated 6 (L) >60 mL/min    Comment: (NOTE) Calculated using the CKD-EPI Creatinine Equation (2021)    Anion gap 14 5 - 15    Comment: Performed at [facility] [address], [place], [place] [address]  CBC     Status: Abnormal   Collection Time: 12/18/19  6:35 AM  Result Value Ref Range   WBC 2.8 (L) 4.0 - 10.5 K/uL   RBC 3.53 (L) 3.87 - 5.11 MIL/uL   Hemoglobin 10.4 (L) 12.0 - 15.0 g/dL   HCT 32.5 (L) 36 - 46 %   MCV 92.1 80.0 - 100.0 fL   MCH 29.5 26.0 - 34.0 pg   MCHC 32.0 30.0 - 36.0 g/dL   RDW 17.2 (H) 11.5 - 15.5 %   Platelets 105 (L) 150 - 400 K/uL    Comment: REPEATED TO VERIFY PLATELET COUNT CONFIRMED BY SMEAR Immature Platelet Fraction may be clinically indicated, consider ordering this additional test [id number]    nRBC 0.0 0.0 - 0.2 %    Comment: Performed at [facility], [place] [address] [place], [place] [address]   No results found.     Medical Problem List and Plan: 1.  L frontal ICH secondary to recent starting of Coumadin- Coumadin on hold with R hemiplegia  -patient may  shower  -ELOS/Goals: Supervision to mod I in 2-3 weeks 2.  Portal vein thrombosis/A fib/Antithrombotics: -DVT/anticoagulation:  Pharmaceutical: SCDs due to thrombocytopenia and recent bleed.  --Dicussed  with Burnetta Sabin NP w/Dr Erlinda Hong regarding coumadin-->will need to wait at least one month due to size of bleed.  --check dopplers in am due to immobility/dense hemiplegia.    -antiplatelet therapy: N/A 3. Pain Management: tylenol prn.  4. Mood: LCSW to follow up for evaluation and support.   -antipsychotic agents: N/A 5. Neuropsych: This patient is capable of making decisions on her own behalf. 6. Wound ulcer/Skin/Wound Care:Pack shin wound with iodoform dressing daily.   7. Fluids/Electrolytes/Nutrition: Strict I/O. Labs with HD on TTS.  8. ESRD: Continue HD on TTS at the end of the day to help with therapy tolerance.  9. HTN: Monitor BP tid--managed on current regimen 10. Pulmonary HTN: Oxygen dependent. Continue Ambrisentan and Selexipag. On Home dose 2L by [place] 11. Cirrhosis  of the liver: Monitor platelets and for any signs of bleeding. Reporting abdominal distension.  (protal vein thrombus) 12. Chronic systolic CHF: Monitor daily weights as well as I/O. Continue tadalafil.  13. CAF: Monitor HR tid--metoprolol has been on hold. Coumadin to be held due to [place].  14. Pancytopenia: WBC 3.3-->2.8,  Platelets 130-->105, hgb 11.5-->10.4. Continue to monitor with serial checks. Recheck CBC in am.   15. Constipation WITH hemorrhoids- suggest Sorbitol- suggested to PA?- also might need Anusol. 16. R neck soreness/myofascial pain- might benefit from lidoderm patches     Bary Leriche- PA-C 12/18/2019   I have personally performed a face to face diagnostic evaluation of this patient and formulated the key components of the plan.  Additionally, I have personally reviewed laboratory data, imaging studies, as well as relevant notes and concur with the physician assistant's documentation above.    Courtney Heys, MD 12/18/2019

## 2019-12-18 NOTE — TOC Transition Note (Signed)
Transition of Care [facility]) - CM/SW Discharge Note   Patient Details  Name: Katie Nunez MRN: [id number] Date of Birth: 08/04/1955  [facility] CM/SW Contact:  Pollie Friar, RN Phone Number: 12/18/2019, 12:12 PM   Clinical Narrative:    Pt is discharging to CIR today. CM signing off.   Final next level of care: IP Rehab Facility Barriers to Discharge: No Barriers Identified   Patient Goals and CMS Choice     Choice offered to / list presented to : Patient  Discharge Placement                       Discharge Plan and Services                                     Social Determinants of Health (SDOH) Interventions     Readmission Risk Interventions No flowsheet data found.

## 2019-12-19 ENCOUNTER — Inpatient Hospital Stay (HOSPITAL_COMMUNITY): Payer: Medicare Other

## 2019-12-19 ENCOUNTER — Inpatient Hospital Stay (HOSPITAL_COMMUNITY): Payer: Medicare Other | Admitting: Speech Pathology

## 2019-12-19 ENCOUNTER — Inpatient Hospital Stay (HOSPITAL_COMMUNITY): Payer: Medicare Other | Admitting: Occupational Therapy

## 2019-12-19 DIAGNOSIS — I619 Nontraumatic intracerebral hemorrhage, unspecified: Secondary | ICD-10-CM | POA: Diagnosis not present

## 2019-12-19 DIAGNOSIS — I10 Essential (primary) hypertension: Secondary | ICD-10-CM

## 2019-12-19 DIAGNOSIS — K7460 Unspecified cirrhosis of liver: Secondary | ICD-10-CM

## 2019-12-19 DIAGNOSIS — D61818 Other pancytopenia: Secondary | ICD-10-CM

## 2019-12-19 DIAGNOSIS — M623 Immobility syndrome (paraplegic): Secondary | ICD-10-CM

## 2019-12-19 DIAGNOSIS — M7918 Myalgia, other site: Secondary | ICD-10-CM

## 2019-12-19 LAB — CBC WITH DIFFERENTIAL/PLATELET
Abs Immature Granulocytes: 0.01 10*3/uL (ref 0.00–0.07)
Basophils Absolute: 0 10*3/uL (ref 0.0–0.1)
Basophils Relative: 1 %
Eosinophils Absolute: 0.1 10*3/uL (ref 0.0–0.5)
Eosinophils Relative: 2 %
HCT: 33.9 % — ABNORMAL LOW (ref 36.0–46.0)
Hemoglobin: 10.7 g/dL — ABNORMAL LOW (ref 12.0–15.0)
Immature Granulocytes: 0 %
Lymphocytes Relative: 19 %
Lymphs Abs: 0.5 10*3/uL — ABNORMAL LOW (ref 0.7–4.0)
MCH: 29.3 pg (ref 26.0–34.0)
MCHC: 31.6 g/dL (ref 30.0–36.0)
MCV: 92.9 fL (ref 80.0–100.0)
Monocytes Absolute: 0.6 10*3/uL (ref 0.1–1.0)
Monocytes Relative: 20 %
Neutro Abs: 1.7 10*3/uL (ref 1.7–7.7)
Neutrophils Relative %: 58 %
Platelets: 101 10*3/uL — ABNORMAL LOW (ref 150–400)
RBC: 3.65 MIL/uL — ABNORMAL LOW (ref 3.87–5.11)
RDW: 17.2 % — ABNORMAL HIGH (ref 11.5–15.5)
WBC: 2.9 10*3/uL — ABNORMAL LOW (ref 4.0–10.5)
nRBC: 0 % (ref 0.0–0.2)

## 2019-12-19 MED ORDER — CHLORHEXIDINE GLUCONATE CLOTH 2 % EX PADS
6.0000 | MEDICATED_PAD | Freq: Every day | CUTANEOUS | Status: DC
  Administered 2019-12-20 – 2019-12-21 (×2): 6 via TOPICAL

## 2019-12-19 MED ORDER — METHOCARBAMOL 500 MG PO TABS
500.0000 mg | ORAL_TABLET | Freq: Three times a day (TID) | ORAL | Status: DC | PRN
  Administered 2019-12-19 – 2019-12-25 (×8): 500 mg via ORAL
  Filled 2019-12-19 (×8): qty 1

## 2019-12-19 MED ORDER — CHLORHEXIDINE GLUCONATE CLOTH 2 % EX PADS: 6.0000 | MEDICATED_PAD | Freq: Two times a day (BID) | CUTANEOUS | Status: DC

## 2019-12-19 MED ORDER — WHITE PETROLATUM EX OINT
TOPICAL_OINTMENT | CUTANEOUS | Status: AC
  Filled 2019-12-19: qty 28.35

## 2019-12-19 MED ORDER — CAMPHOR-MENTHOL 0.5-0.5 % EX LOTN
TOPICAL_LOTION | CUTANEOUS | Status: DC | PRN
  Filled 2019-12-19: qty 222

## 2019-12-19 MED ORDER — SENNOSIDES-DOCUSATE SODIUM 8.6-50 MG PO TABS
1.0000 | ORAL_TABLET | Freq: Two times a day (BID) | ORAL | Status: DC
  Administered 2019-12-21 – 2019-12-23 (×4): 1 via ORAL
  Filled 2019-12-19 (×7): qty 1

## 2019-12-19 NOTE — Progress Notes (Signed)
[facility] PHYSICAL MEDICINE & REHABILITATION PROGRESS NOTE   Subjective/Complaints: Pt c/o of neck/shoulder spasms/knots. Also has had loose stools.  ROS: Patient denies fever, rash, sore throat, blurred vision, nausea, vomiting,  cough, shortness of breath or chest pain,  headache, or mood change.      Objective:   No results found. Recent Labs    12/18/19 0635 12/19/19 0442  WBC 2.8* 2.9*  HGB 10.4* 10.7*  HCT 32.5* 33.9*  PLT 105* 101*   Recent Labs    12/16/19 1717 12/18/19 0635  NA 134* 136  K 3.4* 3.6  CL 93* 96*  CO2 28 26  GLUCOSE 178* 90  BUN 26* 46*  CREATININE 4.78* 7.27*  CALCIUM 8.5* 8.7*   No intake or output data in the 24 hours ending 12/19/19 0757   Pressure Injury 12/18/19 Sacrum Medial Stage 1 -  Intact skin with non-blanchable redness of a localized area usually over a bony prominence. Red, non-blanchable spot over sacrum area (Active)  12/18/19 1707  Location: Sacrum  Location Orientation: Medial  Staging: Stage 1 -  Intact skin with non-blanchable redness of a localized area usually over a bony prominence.  Wound Description (Comments): Red, non-blanchable spot over sacrum area  Present on Admission: Yes    Physical Exam:  General: Alert and oriented x 3, No apparent distress HEENT: Head is normocephalic, atraumatic, PERRLA, EOMI, sclera anicteric, oral mucosa pink and moist, dentition intact, ext ear canals clear,  Neck: Supple without JVD or lymphadenopathy Heart: Reg rate and rhythm. No murmurs rubs or gallops Chest: CTA bilaterally without wheezes, rales, or rhonchi; no distress Abdomen: Soft, non-tender, non-distended, bowel sounds positive. Extremities: No clubbing, cyanosis, or edema. Pulses are 2+ Skin: Clean and intact without signs of breakdown, LUE fistula intact. Left calf wound packed. ?mild sacral redness Neuro: Pt is cognitively appropriate with normal insight, memory, and awareness. Cranial nerves 2-12 are intact. Seems  to sense pain and LT in all 4's. Reflexes are 2+ in all 4's. Fine motor coordination is intact. No tremors. Motor function is grossly 5/5 LUE and LLE. 0/5 RUE and RLE. Marland Kitchen  Musculoskeletal: pain with cervical rom and palpation. Tightness in left trap, not right side so much this morning.  didn't appreciated TPI's.  Psych: Pt's affect is appropriate. Pt is cooperative. Perhaps a little anxious.    Vital Signs Blood pressure 121/67, pulse 80, temperature 97.9 F (36.6 C), temperature source Oral, resp. rate 16, height 5\' 5"  (1.651 m), weight 55.2 kg, last menstrual period 11/15/2008, SpO2 93 %.    Assessment/Plan: 1. Functional deficits which require 3+ hours per day of interdisciplinary therapy in a comprehensive inpatient rehab setting.  Physiatrist is providing close team supervision and 24 hour management of active medical problems listed below.  Physiatrist and rehab team continue to assess barriers to discharge/monitor patient progress toward functional and medical goals  Care Tool:  Bathing              Bathing assist       Upper Body Dressing/Undressing Upper body dressing        Upper body assist      Lower Body Dressing/Undressing Lower body dressing      What is the patient wearing?: Incontinence brief     Lower body assist Assist for lower body dressing: Total Assistance - Patient < 25%     Toileting Toileting    Toileting assist Assist for toileting: Total Assistance - Patient < 25%  Transfers Chair/bed transfer  Transfers assist           Locomotion Ambulation   Ambulation assist              Walk 10 feet activity   Assist           Walk 50 feet activity   Assist           Walk 150 feet activity   Assist           Walk 10 feet on uneven surface  activity   Assist           Wheelchair     Assist               Wheelchair 50 feet with 2 turns activity    Assist             Wheelchair 150 feet activity     Assist          Blood pressure 121/67, pulse 80, temperature 97.9 F (36.6 C), temperature source Oral, resp. rate 16, height 5\' 5"  (1.651 m), weight 55.2 kg, last menstrual period 11/15/2008, SpO2 93 %.  Medical Problem List and Plan: 1.  L frontal ICH secondary to recent starting of Coumadin- Coumadin on hold with R hemiplegia             -patient may  shower             -ELOS/Goals: Supervision to mod I in 2-3 weeks  -begin therapies. Discussed rehab/progrnosis with patient today 2.  Portal vein thrombosis/A fib/Antithrombotics: -DVT/anticoagulation:  Pharmaceutical: SCDs due to thrombocytopenia and recent bleed.  --per neuro no coumadin-->will need to wait at least one month due to size of bleed.  --check dopplers today              -antiplatelet therapy: N/A 3. Pain Management: tylenol prn.   -add kpad for neck pain  -pt to work with pt today, might benefit from massage, K-tape  -low dose robaxin prn for spasms 4. Mood: LCSW to follow up for evaluation and support.              -antipsychotic agents: N/A 5. Neuropsych: This patient is capable of making decisions on her own behalf. 6. Wound ulcer/Skin/Wound Care:Pack LLE wound with iodoform dressing daily.    -turning, nutrition, education 7. Fluids/Electrolytes/Nutrition: Strict I/O. Labs with HD on TTS.  8. ESRD: Continue HD on TTS at the end of the day to help with therapy tolerance.  9. HTN: Monitor BP tid-currently controlled on current regimen 10. Pulmonary HTN: Oxygen dependent. Continue Ambrisentan and Selexipag. On Home dose 2L by [place] 11. Cirrhosis of the liver: Monitor platelets and for any signs of bleeding. Reporting abdominal distension.  (protal vein thrombus)  -pt asked about scheduled CT scan of liver prior to [place]. Told her we would monitor labs, clinically for now. Likely will wait until discharged 12. Chronic systolic CHF: Monitor daily weights as well as I/O. Continue  tadalafil.    Filed Weights   12/18/19 1627 12/19/19 0500  Weight: 59.8 kg 55.2 kg    13. CAF: Monitor HR tid--metoprolol has been on hold. Coumadin to be held due to [place].  14. Pancytopenia: WBC 3.3-->2.8,  Platelets 130-->105, hgb 11.5-->10.4. Continue to monitor with serial checks.   -12/8 HGB/PLT stable today 10.7/101  15. Constipation WITH hemorrhoids- suggest Sorbitol- suggested to PA?- also might need Anusol.  -pt with multiple loose stools last night.   -  hold am senna-s, resume this evening  -prn miralax only     LOS: 1 days A FACE TO FACE EVALUATION WAS PERFORMED  Meredith Staggers 12/19/2019, 7:57 AM

## 2019-12-19 NOTE — Evaluation (Signed)
Physical Therapy Assessment and Plan  Patient Details  Name: Katie Nunez MRN: [id number] Date of Birth: 1956-01-11  PT Diagnosis: Difficulty walking, Hemiplegia dominant and Muscle weakness Rehab Potential: Good ELOS: 3-3.5 weeks   Today's Date: 12/19/2019 PT Individual Time: 0802-0900 PT Individual Time Calculation (min): 58 min    Hospital Problem: Principal Problem:   Intraparenchymal hemorrhage of brain [facility])   Past Medical History:  Past Medical History:  Diagnosis Date  . Achalasia   . Anxiety   . Dysplasia of cervix, low grade (CIN 1)   . Environmental allergies    "all year long" (12/27/2016)  . ESRD (end stage renal disease) on dialysis [facility])    "TTS; [place]" (12/27/2016)  . Fibromyalgia   . GERD (gastroesophageal reflux disease)   . Gout   . Hypertension   . IBS (irritable bowel syndrome)   . MVP (mitral valve prolapse)   . RA (rheumatoid arthritis) (HCC)    FOLLOWED BY DR. SHANAHAN  . Raynaud's disease   . Scleroderma ([place])   . Seasonal allergies   . Thrombocytopenia ([place]) 07/01/2016   Acute fall to 13,000 07/01/16  . Tubular adenoma 01/08/2008   CECUM  . Vitamin D deficiency    Past Surgical History:  Past Surgical History:  Procedure Laterality Date  . ANKLE FRACTURE SURGERY Right   . AV FISTULA PLACEMENT Left 06/28/2016   Procedure: left arm ARTERIOVENOUS (AV) FISTULA CREATION;  Surgeon: Rosetta Posner, MD;  Location: [place];  Service: Vascular;  Laterality: Left;  . BASCILIC VEIN TRANSPOSITION Left 09/27/2016   Procedure: LEFT UPPER ARM CEPHALIC VEIN TRANSPOSITION;  Surgeon: Rosetta Posner, MD;  Location: [place];  Service: Vascular;  Laterality: Left;  . BREAST BIOPSY     "? side"  . CESAREAN SECTION  1994  . CO2 LASER OF CERVIX    . COLONOSCOPY W/ BIOPSIES  01/08/2008  . INSERTION OF DIALYSIS CATHETER Right 06/28/2016   Procedure: INSERTION OF DIALYSIS CATHETER, right internal jugular;  Surgeon: Rosetta Posner, MD;  Location: [place];  Service:  Vascular;  Laterality: Right;  . MYOMECTOMY    . PELVIC LAPAROSCOPY  2011  . superficial thrombophlebitis Left 07-2014    Assessment & Plan Clinical Impression: Patient is a 64 year old RH-female with history of HTN, ESRD-TTS, Raynaud's, achalasia, cirrhosis, PAH-oxygen dependent, A fib, hospitalizations this fall for c diff colitis/portal vein thrombosis with addition of coumadin as well as LLE abscess 10/2019 s/p drainage. She was admitted on 12/12/19 with onset of LUE/LLE weakness and found to have right frontal ICH with cerebral edema. INR 1.7 at admission and coumadin reversed with Kcenral and vitamin K.Neurology recommended keeping SBP<140 and follow up CT head showed no change in ICH or edema. .   Stroke felt to be secondary to warfarin coagulopathy and Dr. Erlinda Hong recommends resuming coumadin or Eliquis on outpatient basis once ICH resolved--clinic to decide. Statin has been on hold due to hemorrhage. HD ongoing. Patient with resultant left sided weakness with poor sitting balance.   Patient transferred to CIR on 12/18/2019 .   Patient currently requires max with mobility secondary to muscle weakness, decreased cardiorespiratoy endurance and decreased oxygen support,   and decreased sitting balance, decreased standing balance, decreased postural control, hemiplegia and decreased balance strategies.  Prior to hospitalization, patient was independent  with mobility and lived with Spouse, Son in a House home.  Home access is  Stairs to enter.  Patient will benefit from skilled PT intervention to  maximize safe functional mobility, minimize fall risk and decrease caregiver burden for planned discharge home with 24 hour assist.  Anticipate patient will benefit from follow up [facility] at discharge.  PT - End of Session Activity Tolerance: Tolerates 30+ min activity without fatigue Endurance Deficit: Yes Endurance Deficit Description: uses supplemental O2 for support PT Assessment Rehab Potential (ACUTE/IP  ONLY): Good PT Barriers to Discharge: Hemodialysis;Home environment access/layout PT Patient demonstrates impairments in the following area(s): Balance;Endurance;Motor;Pain;Perception;Safety;Skin Integrity PT Transfers Functional Problem(s): Bed Mobility;Bed to Chair;Car PT Locomotion Functional Problem(s): Wheelchair Mobility;Stairs PT Plan PT Intensity: Minimum of 1-2 x/day ,45 to 90 minutes PT Frequency: 5 out of 7 days PT Duration Estimated Length of Stay: 3-3.5 weeks PT Treatment/Interventions: Ambulation/gait training;Cognitive remediation/compensation;Discharge planning;DME/adaptive equipment instruction;Functional mobility training;Pain management;Psychosocial support;Splinting/orthotics;Therapeutic Activities;UE/LE Strength taining/ROM;Visual/perceptual remediation/compensation;Balance/vestibular training;Community reintegration;Disease management/prevention;Functional electrical stimulation;Neuromuscular re-education;Skin care/wound management;Patient/family education;Stair training;Therapeutic Exercise;UE/LE Coordination activities;Wheelchair propulsion/positioning PT Transfers Anticipated Outcome(s): CGA PT Locomotion Anticipated Outcome(s): minA household distances PT Recommendation Recommendations for Other Services: Therapeutic Recreation consult Therapeutic Recreation Interventions: Stress management;Outing/community reintergration Follow Up Recommendations: Home health PT;24 hour supervision/assistance Patient destination: Home Equipment Recommended: To be determined   PT Evaluation Precautions/Restrictions Precautions Precautions: Fall Precaution Comments: Rt hemiplegia - subluxation General Chart Reviewed: Yes Family/Caregiver Present: Yes  Pain Pain Assessment Pain Score: 0-No pain Home Living/Prior Functioning Home Living Available Help at Discharge: Family Type of Home: House Home Access: Stairs to enter [employer] of Steps: 1 to enter garage, 2  at front (rails at front but pt reports she uses garage) Home Layout: Multi-level Alternate Level Stairs-Number of Steps: 12-15 Alternate Level Stairs-Rails: Left Bathroom Shower/Tub: Walk-in shower;Tub only Additional Comments: pt has been sleeping on living room couch due to difficulty with stairs for 6-9 months. she uses shower 1x  a week when spouse or son helps her up the stairs.  Lives With: Spouse;Son Prior Function Level of Independence: Independent with basic ADLs;Independent with gait;Independent with transfers;Requires assistive device for independence (would need assist when she was in a hurry) Driving: Yes Vocation: Retired Comments: Pt reports she drives self to HD.  She uses 2L during day, 3L O2 at night (pt states she leaves it on 2L: most days and nights) Vision/Perception  Vision - Assessment Ocular Range of Motion: Restricted on the right Tracking/Visual Pursuits: Decreased smoothness of eye movement to RIGHT superior field;Decreased smoothness of eye movement to RIGHT inferior field Perception Perception: Within Functional Limits Praxis Praxis: Intact  Cognition Overall Cognitive Status: Within Functional Limits for tasks assessed Arousal/Alertness: Awake/alert Orientation Level: Oriented X4;Oriented to person;Oriented to place;Oriented to time;Oriented to situation Attention: Focused;Sustained Focused Attention: Appears intact Sustained Attention: Appears intact Memory: Appears intact Immediate Memory Recall: Sock;Blue;Bed Memory Recall Sock: Without Cue Memory Recall Blue: Without Cue Memory Recall Bed: Without Cue Awareness: Appears intact Problem Solving: Appears intact Safety/Judgment: Appears intact Sensation Sensation Light Touch: Appears Intact Hot/Cold: Appears Intact Proprioception: Impaired by gross assessment Stereognosis: Impaired by gross assessment Coordination Gross Motor Movements are Fluid and Coordinated: No Fine Motor Movements are  Fluid and Coordinated: No Coordination and Movement Description: flaccid RUE Motor  Motor Motor: Hemiplegia Motor - Skilled Clinical Observations: flaccid RUE/ RLE  Trunk/Postural Assessment  Cervical Assessment Cervical Assessment: Exceptions to [facility] (head tilt to the L) Thoracic Assessment Thoracic Assessment: Exceptions to [facility] (rounded shoulders) Lumbar Assessment Lumbar Assessment: Exceptions to [facility] (posterior pelvic tilt) Postural Control Postural Control: Deficits on evaluation (leans to the R)  Balance Balance Balance Assessed: Yes Static Sitting Balance Static Sitting - Level  of Assistance: 4: Min assist Dynamic Sitting Balance Dynamic Sitting - Level of Assistance: 2: Max assist Sitting balance - Comments: reliant on UE support  Static Standing Balance Static Standing - Level of Assistance: 2: Max assist Dynamic Standing Balance Dynamic Standing - Balance Support: During functional activity Dynamic Standing - Level of Assistance: 2: Max assist Extremity Assessment  RLE Assessment RLE Assessment: Exceptions to [facility] General Strength Comments: Grossly 0/5. No muscular activation noted. LLE Assessment LLE Assessment: Within Functional Limits  Care Tool Care Tool Bed Mobility Roll left and right activity   Roll left and right assist level: Moderate Assistance - Patient 50 - 74%    Sit to lying activity   Sit to lying assist level: Moderate Assistance - Patient 50 - 74%    Lying to sitting edge of bed activity   Lying to sitting edge of bed assist level: Moderate Assistance - Patient 50 - 74%     Care Tool Transfers Sit to stand transfer   Sit to stand assist level: Moderate Assistance - Patient 50 - 74%    Chair/bed transfer   Chair/bed transfer assist level: Moderate Assistance - Patient 50 - 74%     Toilet transfer   Assist Level: Moderate Assistance - Patient 50 - 74%    Car transfer   Car transfer assist level: Moderate Assistance - Patient 50 - 74%       Care Tool Locomotion Ambulation   Assist level: 2 helpers Assistive device: Hand held assist Max distance: 30'  Walk 10 feet activity   Assist level: 2 helpers Assistive device: Hand held assist   Walk 50 feet with 2 turns activity Walk 50 feet with 2 turns activity did not occur: Safety/medical concerns      Walk 150 feet activity Walk 150 feet activity did not occur: Safety/medical concerns      Walk 10 feet on uneven surfaces activity Walk 10 feet on uneven surfaces activity did not occur: Safety/medical concerns      Stairs Stair activity did not occur: Safety/medical concerns        Walk up/down 1 step activity Walk up/down 1 step or curb (drop down) activity did not occur: Safety/medical concerns     Walk up/down 4 steps activity did not occuR: Safety/medical concerns  Walk up/down 4 steps activity      Walk up/down 12 steps activity Walk up/down 12 steps activity did not occur: Safety/medical concerns      Pick up small objects from floor Pick up small object from the floor (from standing position) activity did not occur: Safety/medical concerns      Wheelchair Will patient use wheelchair at discharge?: Yes Type of Wheelchair: Manual Wheelchair activity did not occur: Safety/medical concerns      Wheel 50 feet with 2 turns activity Wheelchair 50 feet with 2 turns activity did not occur: Safety/medical concerns    Wheel 150 feet activity Wheelchair 150 feet activity did not occur: Safety/medical concerns      Refer to Care Plan for Long Term Goals  SHORT TERM GOAL WEEK 1 PT Short Term Goal 1 (Week 1): Pt will complete bed mobility with minA PT Short Term Goal 2 (Week 1): Pt will perform bed to chair transfer with minA PT Short Term Goal 3 (Week 1): Pt will perform sit to stand with minA PT Short Term Goal 4 (Week 1): Pt will ambulate x30' with modA +1.  Recommendations for other services: Therapeutic Recreation  Stress management and Outing/community  reintegration  Skilled Therapeutic Intervention  Evaluation completed (see details above and below) with education on PT POC and goals and individual treatment initiated with focus on bed mobility, balance, transfers, and ambulation.  Pt received supine in bed and agrees to therapy. No complaint of pain. Supine to sit with modA and cues on hand placement and sequencing. Stand pivot transfer to [facility] with modA, PT blocking R knee and guiding pt's hips to WC. WC transport to gym for time management. Pt performs car transfer with modA. Pt ambulates with wall rail on L and PT providing modified three musketeer's technique on the R, x30' with +2 WC follow. PT provides totalA to progress R leg and blocks knee during stance phase to prevent buckling, also facilitating weight shift to the R and cueing pt to attempt swing through gait pattern on L. Gait pattern and weight shifting gradually improve with distance. Pt performs stand pivot transfer to recliner with modA. Left seated in recliner with alarm intact and all needs within reach.   Mobility Bed Mobility Bed Mobility: Supine to Sit;Sit to Supine Supine to Sit: Moderate Assistance - Patient 50-74% Sit to Supine: Moderate Assistance - Patient 50-74% Transfers Transfers: Sit to Stand;Stand Pivot Transfers;Stand to Sit Sit to Stand: Moderate Assistance - Patient 50-74% Stand to Sit: Moderate Assistance - Patient 50-74% Stand Pivot Transfers: Moderate Assistance - Patient 50 - 74% Stand Pivot Transfer Details: Verbal cues for technique;Tactile cues for weight shifting;Tactile cues for initiation;Tactile cues for sequencing;Tactile cues for posture;Verbal cues for sequencing;Manual facilitation for weight shifting Transfer (Assistive device):  (HHA) Locomotion  Gait Ambulation: Yes Gait Assistance: 2 Helpers Gait Distance (Feet): 30 Feet Assistive device: 1 person hand held assist;Other (Comment) (handrail) Gait Assistance Details: Tactile cues for  placement;Verbal cues for technique;Verbal cues for gait pattern;Manual facilitation for weight bearing;Manual facilitation for weight shifting;Verbal cues for sequencing;Manual facilitation for placement Gait Gait: Yes Gait Pattern: Impaired Gait Pattern: Step-to pattern;Decreased weight shift to right (Hemigait) Gait velocity: decreased Stairs / Additional Locomotion Stairs: No   Discharge Criteria: Patient will be discharged from PT if patient refuses treatment 3 consecutive times without medical reason, if treatment goals not met, if there is a change in medical status, if patient makes no progress towards goals or if patient is discharged from hospital.  The above assessment, treatment plan, treatment alternatives and goals were discussed and mutually agreed upon: by patient  Breck Coons, PT, DPT 12/19/2019, 12:17 PM

## 2019-12-19 NOTE — Evaluation (Addendum)
Occupational Therapy Assessment and Plan  Patient Details  Name: Katie Nunez MRN: [id number] Date of Birth: [birth year]  OT Diagnosis: abnormal posture, disturbance of vision, flaccid hemiplegia and hemiparesis, hemiplegia affecting dominant side and muscle weakness (generalized) Rehab Potential: Rehab Potential (ACUTE ONLY): Good ELOS: 3 weeks   Today's Date: 12/19/2019 OT Individual Time: 7001-7494 OT Individual Time Calculation (min): 55 min     Hospital Problem: Principal Problem:   Intraparenchymal hemorrhage of brain [facility])   Past Medical History:  Past Medical History:  Diagnosis Date  . Achalasia   . Anxiety   . Dysplasia of cervix, low grade (CIN 1)   . Environmental allergies    "all year long" (12/27/2016)  . ESRD (end stage renal disease) on dialysis [facility])    "TTS; [place]" (12/27/2016)  . Fibromyalgia   . GERD (gastroesophageal reflux disease)   . Gout   . Hypertension   . IBS (irritable bowel syndrome)   . MVP (mitral valve prolapse)   . RA (rheumatoid arthritis) (HCC)    FOLLOWED BY DR. SHANAHAN  . Raynaud's disease   . Scleroderma ([place])   . Seasonal allergies   . Thrombocytopenia ([place]) 07/01/2016   Acute fall to 13,000 07/01/16  . Tubular adenoma 01/08/2008   CECUM  . Vitamin D deficiency    Past Surgical History:  Past Surgical History:  Procedure Laterality Date  . ANKLE FRACTURE SURGERY Right   . AV FISTULA PLACEMENT Left 06/28/2016   Procedure: left arm ARTERIOVENOUS (AV) FISTULA CREATION;  Surgeon: Rosetta Posner, MD;  Location: [place];  Service: Vascular;  Laterality: Left;  . BASCILIC VEIN TRANSPOSITION Left 09/27/2016   Procedure: LEFT UPPER ARM CEPHALIC VEIN TRANSPOSITION;  Surgeon: Rosetta Posner, MD;  Location: [place];  Service: Vascular;  Laterality: Left;  . BREAST BIOPSY     "? side"  . CESAREAN SECTION  1994  . CO2 LASER OF CERVIX    . COLONOSCOPY W/ BIOPSIES  01/08/2008  . INSERTION OF DIALYSIS CATHETER Right 06/28/2016    Procedure: INSERTION OF DIALYSIS CATHETER, right internal jugular;  Surgeon: Rosetta Posner, MD;  Location: [place];  Service: Vascular;  Laterality: Right;  . MYOMECTOMY    . PELVIC LAPAROSCOPY  2011  . superficial thrombophlebitis Left 07-2014    Assessment & Plan Clinical Impression: Katie Nunez is a 64 year old RH-female with history of HTN, ESRD-TTS, Raynaud's, achalasia, cirrhosis, PAH-oxygen dependent, A fib, hospitalizations this fall for c diff colitis/portal vein thrombosis with addition of coumadin as well as LLE abscess 10/2019 s/p drainage. She was admitted on 12/12/19 with onset of LUE/LLE weakness and found to have right frontal ICH with cerebral edema. INR 1.7 at admission and coumadin reversed with Kcenral and vitamin K.Neurology recommended keeping SBP<140 and follow up CT head showed no change in ICH or edema. .    Stroke felt to be secondary to warfarin coagulopathy and Dr. Erlinda Hong recommends resuming coumadin or Eliquis on outpatient basis once ICH resolved--clinic to decide. Statin has been on hold due to hemorrhage. HD ongoing with    Patient with resultant left sided weakness with poor sitting balance. Therapy ongoing and CIR recommended due to functional decline.    Patient transferred to CIR on 12/18/2019 .    Patient currently requires max with basic self-care skills secondary to muscle weakness, decreased cardiorespiratoy endurance, abnormal tone, decreased visual motor skills and decreased sitting balance, decreased standing balance, decreased postural control, hemiplegia and decreased balance strategies.  Prior to hospitalization, patient could complete BADLs with modified independent .  Patient will benefit from skilled intervention to increase independence with basic self-care skills prior to discharge home with care partner.  Anticipate patient will require minimal physical assistance and follow up home health.  OT - End of Session Activity Tolerance: Tolerates 10 - 20  min activity with multiple rests Endurance Deficit: Yes Endurance Deficit Description: uses supplemental O2 for support OT Assessment Rehab Potential (ACUTE ONLY): Good OT Patient demonstrates impairments in the following area(s): Balance;Endurance;Motor;Vision OT Basic ADL's Functional Problem(s): Eating;Grooming;Bathing;Dressing;Toileting OT Transfers Functional Problem(s): Toilet;Tub/Shower OT Additional Impairment(s): Fuctional Use of Upper Extremity OT Plan OT Intensity: Minimum of 1-2 x/day, 45 to 90 minutes OT Frequency: 5 out of 7 days OT Duration/Estimated Length of Stay: 3 weeks OT Treatment/Interventions: Balance/vestibular training;Discharge planning;DME/adaptive equipment instruction;Functional electrical stimulation;Functional mobility training;Psychosocial support;Patient/family education;Neuromuscular re-education;Self Care/advanced ADL retraining;Therapeutic Activities;Visual/perceptual remediation/compensation;UE/LE Coordination activities;UE/LE Strength taining/ROM;Therapeutic Exercise OT Self Feeding Anticipated Outcome(s): mod I OT Basic Self-Care Anticipated Outcome(s): min A OT Toileting Anticipated Outcome(s): min A OT Bathroom Transfers Anticipated Outcome(s): min A OT Recommendation Patient destination: Home Follow Up Recommendations: Home health OT Equipment Recommended: To be determined   OT Evaluation Precautions/Restrictions  Precautions Precautions: Fall Precaution Comments: Rt hemiplegia - subluxation  Pain Pain Assessment Pain Score: 0-No pain Home Living/Prior Functioning Home Living Family/patient expects to be discharged to:: Private residence Living Arrangements: Spouse/significant other Available Help at Discharge: Family Type of Home: House Home Access: Stairs to enter [employer] of Steps: 1 to enter garage, 2 at front (rails at front but pt reports she uses garage) Home Layout: Multi-level Alternate Level Stairs-Number of  Steps: 12-15 Alternate Level Stairs-Rails: Left Bathroom Shower/Tub: Walk-in shower, Tub only Additional Comments: pt has been sleeping on living room couch due to difficulty with stairs for 6-9 months. she uses shower 1x  a week when spouse or son helps her up the stairs.  Lives With: Spouse, Son Prior Function Level of Independence: Independent with basic ADLs, Independent with gait, Independent with transfers, Requires assistive device for independence (would need assist when she was in a hurry) Driving: Yes Vocation: Retired Comments: Pt reports she drives self to HD.  She uses 2L during day, 3L O2 at night (pt states she leaves it on 2L: most days and nights) Vision Baseline Vision/History: Wears glasses Wears Glasses: Reading only Patient Visual Report: No change from baseline Ocular Range of Motion: Restricted on the right Tracking/Visual Pursuits: Decreased smoothness of eye movement to RIGHT superior field;Decreased smoothness of eye movement to RIGHT inferior field Visual Fields: No apparent deficits Perception  Perception: Within Functional Limits Praxis Praxis: Intact Cognition Overall Cognitive Status: Within Functional Limits for tasks assessed Arousal/Alertness: Awake/alert Memory: Appears intact Recalled 2021, December, day of week and location of room Immediate Memory Recall: Sock;Blue;Bed Memory Recall Sock: Without Cue Memory Recall Blue: Without Cue Memory Recall Bed: Without Cue Attention: Focused;Sustained Focused Attention: Appears intact Sustained Attention: Appears intact Awareness: Appears intact Problem Solving: Appears intact Safety/Judgment: Appears intact Sensation Sensation Light Touch: Appears Intact Hot/Cold: Appears Intact Proprioception: Impaired by gross assessment Stereognosis: Impaired by gross assessment Coordination Gross Motor Movements are Fluid and Coordinated: No Fine Motor Movements are Fluid and Coordinated: No Coordination  and Movement Description: flaccid RUE Motor  Motor Motor: Hemiplegia Motor - Skilled Clinical Observations: flaccid RUE/ RLE  Trunk/Postural Assessment  Cervical Assessment Cervical Assessment: Exceptions to [facility] (head tilt to the left) Thoracic Assessment Thoracic Assessment: Exceptions to [facility] (rounded shoulders) Lumbar  Assessment Lumbar Assessment: Exceptions to [facility] (posterior pelvic tilt) Postural Control Postural Control: Deficits on evaluation (leans to the right)  Balance Static Sitting Balance Static Sitting - Level of Assistance: 4: Min assist Dynamic Sitting Balance Dynamic Sitting - Level of Assistance: 2: Max assist Static Standing Balance Static Standing - Level of Assistance: 2: Max assist Extremity/Trunk Assessment RUE Assessment Passive Range of Motion (PROM) Comments: WFL RUE Body System: Neuro Brunstrum levels for arm and hand: Arm;Hand Brunstrum level for arm: Stage I Presynergy Brunstrum level for hand: Stage I Flaccidity LUE Assessment LUE Assessment: Within Functional Limits General Strength Comments: 4-/5  Care Tool Care Tool Self Care Eating   Eating Assist Level: Set up assist    Oral Care    Oral Care Assist Level: Minimal Assistance - Patient > 75%    Bathing   Body parts bathed by patient: Chest;Abdomen;Right upper leg;Left upper leg;Right arm;Face Body parts bathed by helper: Left arm;Front perineal area;Buttocks;Right lower leg;Left lower leg   Assist Level: Moderate Assistance - Patient 50 - 74%    Upper Body Dressing(including orthotics)   What is the patient wearing?: Pull over shirt   Assist Level: Maximal Assistance - Patient 25 - 49%    Lower Body Dressing (excluding footwear)   What is the patient wearing?: Underwear/pull up;Pants Assist for lower body dressing: Total Assistance - Patient < 25%    Putting on/Taking off footwear   What is the patient wearing?: Non-skid slipper socks Assist for footwear: Total Assistance -  Patient < 25%       Care Tool Toileting Toileting activity   Assist for toileting: Total Assistance - Patient < 25%     Care Tool Bed Mobility Roll left and right activity   Roll left and right assist level: Moderate Assistance - Patient 50 - 74%    Sit to lying activity   Sit to lying assist level: Moderate Assistance - Patient 50 - 74%    Lying to sitting edge of bed activity   Lying to sitting edge of bed assist level: Moderate Assistance - Patient 50 - 74%     Care Tool Transfers Sit to stand transfer   Sit to stand assist level: Moderate Assistance - Patient 50 - 74%    Chair/bed transfer   Chair/bed transfer assist level: Moderate Assistance - Patient 50 - 74%     Toilet transfer   Assist Level: Moderate Assistance - Patient 50 - 74%     Care Tool Cognition Expression of Ideas and Wants Expression of Ideas and Wants: Without difficulty (complex and basic) - expresses complex messages without difficulty and with speech that is clear and easy to understand   Understanding Verbal and Non-Verbal Content Understanding Verbal and Non-Verbal Content: Understands (complex and basic) - clear comprehension without cues or repetitions   Memory/Recall Ability *first 3 days only Memory/Recall Ability *first 3 days only: Current season;Location of own room;Staff names and faces;That he or she is in a hospital/hospital unit    Refer to Care Plan for [place] 1 OT Short Term Goal 1 (Week 1): Pt will complete a stand pivot to toilet with mod A of 1. OT Short Term Goal 2 (Week 1): Pt will stand at toilet with mod A of 1. OT Short Term Goal 3 (Week 1): Pt will don shirt with mod A. OT Short Term Goal 4 (Week 1): Pt will don pants over feet with mod A.  Recommendations for other  services: None    Skilled Therapeutic Intervention ADL ADL Eating: Set up Grooming: Minimal assistance Upper Body Bathing: Moderate assistance Where Assessed-Upper Body  Bathing: Chair Lower Body Bathing: Maximal assistance Where Assessed-Lower Body Bathing: Chair Upper Body Dressing: Maximal assistance Where Assessed-Upper Body Dressing: Chair Lower Body Dressing: Dependent Where Assessed-Lower Body Dressing: Chair Toileting: Dependent Where Assessed-Toileting: Glass blower/designer: Moderate assistance Toilet Transfer Method: Other (comment) (stedy)   Pt seen for initial evaluation and ADL training with a focus on functional mobility.  Pt has a great deal of medical challenges which she has learned to modify around by moving slowly to enable herself to get her self care accomplished.  With her current stroke, she is now totally flaccid on her R side with some difficulty visually tracking to the R. As a result, she is needing significant help with self care and mobility.  Discussed this along with her goals, OT POC, ELOS and expected self care goals.  Pt resting in recliner with all needs met.  Belt alarm on.   Discharge Criteria: Patient will be discharged from OT if patient refuses treatment 3 consecutive times without medical reason, if treatment goals not met, if there is a change in medical status, if patient makes no progress towards goals or if patient is discharged from hospital.  The above assessment, treatment plan, treatment alternatives and goals were discussed and mutually agreed upon: by patient  [facility] 12/19/2019, 12:32 PM

## 2019-12-19 NOTE — Evaluation (Signed)
Speech Language Pathology Assessment and Plan  Patient Details  Name: Katie Nunez MRN: [id number] Date of Birth: Apr 04, 1955  SLP Diagnosis: Cognitive Impairments  Rehab Potential: Excellent ELOS: 3 weeks    Today's Date: 12/19/2019 SLP Individual Time: 1255-1350 SLP Individual Time Calculation (min): 55 min   Hospital Problem: Principal Problem:   Intraparenchymal hemorrhage of brain [facility])  Past Medical History:  Past Medical History:  Diagnosis Date  . Achalasia   . Anxiety   . Dysplasia of cervix, low grade (CIN 1)   . Environmental allergies    "all year long" (12/27/2016)  . ESRD (end stage renal disease) on dialysis [facility])    "TTS; [place]" (12/27/2016)  . Fibromyalgia   . GERD (gastroesophageal reflux disease)   . Gout   . Hypertension   . IBS (irritable bowel syndrome)   . MVP (mitral valve prolapse)   . RA (rheumatoid arthritis) (HCC)    FOLLOWED BY DR. SHANAHAN  . Raynaud's disease   . Scleroderma ([place])   . Seasonal allergies   . Thrombocytopenia ([place]) 07/01/2016   Acute fall to 13,000 07/01/16  . Tubular adenoma 01/08/2008   CECUM  . Vitamin D deficiency    Past Surgical History:  Past Surgical History:  Procedure Laterality Date  . ANKLE FRACTURE SURGERY Right   . AV FISTULA PLACEMENT Left 06/28/2016   Procedure: left arm ARTERIOVENOUS (AV) FISTULA CREATION;  Surgeon: Rosetta Posner, MD;  Location: [place];  Service: Vascular;  Laterality: Left;  . BASCILIC VEIN TRANSPOSITION Left 09/27/2016   Procedure: LEFT UPPER ARM CEPHALIC VEIN TRANSPOSITION;  Surgeon: Rosetta Posner, MD;  Location: [place];  Service: Vascular;  Laterality: Left;  . BREAST BIOPSY     "? side"  . CESAREAN SECTION  1994  . CO2 LASER OF CERVIX    . COLONOSCOPY W/ BIOPSIES  01/08/2008  . INSERTION OF DIALYSIS CATHETER Right 06/28/2016   Procedure: INSERTION OF DIALYSIS CATHETER, right internal jugular;  Surgeon: Rosetta Posner, MD;  Location: [place];  Service: Vascular;  Laterality:  Right;  . MYOMECTOMY    . PELVIC LAPAROSCOPY  2011  . superficial thrombophlebitis Left 07-2014    Assessment / Plan / Recommendation Clinical Impression Patient is a 64 y.o.femalewith history of ESRD-HD on TTS, Raynaud's, scleroderma, achalasia, cirrhosis,PAH- oxygen dependent, A fib, who has had multiple recent admissions for cdiff colitis/portal vein thrombosis9/2021 this fall, with addition of coumadin, as well as LLE abscess 10/2019, s/p drainage. She was admitted on 12/12/2019 with onset of LUE/LLE weakness. No reports of speech deficits, headaches or N/V. She was found to have left frontal ICH with cerebral edema and INR 1.7 admission. The brain showed  hemorrhage subcortical left frontal white matter with a single rim of vasogenic edema and few remote hemorrhages in bilateral upper cerebellum. Coumadin reversed with Kcentra and vitamin K and neurology recommended keeping SBP less than 140.Follow-up CT head on 12/2 showed no change in [place] with surrounding edema. Speech therapy evaluation done revealing high-level cognitive deficits with mild impairment.Wound care consulted for management of left leg wound and recommended packing with iodoform gauze. PT/OT evaluation revealed right hemiplegia with impaired cognition, decreased activity tolerance and impaired balance affecting ADLs and mobility. CIR was recommended due to functional decline.Patient admitted 12/18/19.  Patient was administered the cognistat and scored WFL on all subtests with the exception of mild impairments in attention and short-term recall. Patient appeared lethargic throughout the evaluation and required overall Min verbal cues for sustained  attention to tasks. Patient's constant requests for clinician also appeared to impact attention and required frequent redirection. Patient would benefit from skilled SLP intervention to maximize her cognitive functioning and overall functional independence prior to discharge.     Skilled Therapeutic Interventions          Administered a cognitive-linguistic evaluation, please see above for details. Patient appeared mildly restless throughout the session and had constant requests throughout the session. Patient requested to remove her sweatshirt and donn a gown and required Mod verbal cues for problem solving task. Patient also appeared lethargic which suspect impacted her overall cognitive performance. Patient left supine in bed with alarm on and all needs within reach. Continue with current plan of care.    SLP Assessment  Patient will need skilled [place] [facility] during CIR admission    Recommendations  Oral Care Recommendations: Oral care BID Recommendations for Other Services: Neuropsych consult Patient destination: Home Follow up Recommendations:  (TBD) Equipment Recommended: None recommended by SLP    SLP Frequency 3 to 5 out of 7 days   SLP Duration  SLP Intensity  SLP Treatment/Interventions 3 weeks  Minumum of 1-2 x/day, 30 to 90 minutes  Cognitive remediation/compensation;Internal/external aids;Cueing hierarchy;Environmental controls;Therapeutic Activities;Functional tasks;Patient/family education    Pain Pain Assessment Pain Score: 0-No pain  Prior Functioning Type of Home: House  Lives With: Spouse;Son Available Help at Discharge: Family Vocation: Retired  Programmer, systems Overall Cognitive Status: Impaired/Different from baseline Arousal/Alertness: Awake/alert Orientation Level: Oriented X4;Oriented to person;Oriented to place;Oriented to time;Oriented to situation Attention: Sustained Focused Attention: Appears intact Sustained Attention: Impaired Sustained Attention Impairment: Functional complex Memory: Impaired Memory Impairment: Decreased short term memory Decreased Short Term Memory: Functional complex Immediate Memory Recall: Sock;Blue;Bed Memory Recall Sock: Without Cue Memory Recall Blue:  Without Cue Memory Recall Bed: Without Cue Awareness: Impaired Awareness Impairment: Anticipatory impairment Problem Solving: Impaired Problem Solving Impairment: Functional complex Safety/Judgment: Appears intact  Comprehension Auditory Comprehension Overall Auditory Comprehension: Appears within functional limits for tasks assessed Expression Expression Primary Mode of Expression: Verbal Verbal Expression Overall Verbal Expression: Appears within functional limits for tasks assessed Written Expression Dominant Hand: Right Oral Motor Oral Motor/Sensory Function Overall Oral Motor/Sensory Function: Within functional limits Motor Speech Overall Motor Speech: Appears within functional limits for tasks assessed  Care Tool Care Tool Cognition Expression of Ideas and Wants Expression of Ideas and Wants: Without difficulty (complex and basic) - expresses complex messages without difficulty and with speech that is clear and easy to understand   Understanding Verbal and Non-Verbal Content Understanding Verbal and Non-Verbal Content: Understands (complex and basic) - clear comprehension without cues or repetitions   Memory/Recall Ability *first 3 days only Memory/Recall Ability *first 3 days only: Current season;Location of own room;Staff names and faces;That he or she is in a hospital/hospital unit     Short Term Goals: Week 1: SLP Short Term Goal 1 (Week 1): Patient will demonstrate complex problem solving for functional and familiar tasks with supervision verbal cues. SLP Short Term Goal 2 (Week 1): Patient will recall new, daily information with Min verbal cues for use of memory compensatory strategies. SLP Short Term Goal 3 (Week 1): Patient will demonstrate selective attention to tasks in a mildly distracting enviornment for 45 minutes with Min verbal cues for redirection.  Refer to Care Plan for Long Term Goals  Recommendations for other services: Neuropsych  Discharge  Criteria: Patient will be discharged from SLP if patient refuses treatment 3 consecutive times without medical reason,  if treatment goals not met, if there is a change in medical status, if patient makes no progress towards goals or if patient is discharged from hospital.  The above assessment, treatment plan, treatment alternatives and goals were discussed and mutually agreed upon: by patient  Farren Landa 12/19/2019, 3:07 PM

## 2019-12-19 NOTE — Progress Notes (Signed)
Inpatient Rehabilitation  Patient information reviewed and entered into eRehab system by Britt Theard M. Billye Pickerel, M.A., CCC/SLP, PPS Coordinator.  Information including medical coding, functional ability and quality indicators will be reviewed and updated through discharge.    

## 2019-12-19 NOTE — Progress Notes (Signed)
Patient Details  Name: Katie Nunez MRN: [id number] Date of Birth: 1955-10-06  Today's Date: 12/19/2019  Hospital Problems: Principal Problem:   Intraparenchymal hemorrhage of brain [facility])  Past Medical History:  Past Medical History:  Diagnosis Date  . Achalasia   . Anxiety   . Dysplasia of cervix, low grade (CIN 1)   . Environmental allergies    "all year long" (12/27/2016)  . ESRD (end stage renal disease) on dialysis [facility])    "TTS; [place]" (12/27/2016)  . Fibromyalgia   . GERD (gastroesophageal reflux disease)   . Gout   . Hypertension   . IBS (irritable bowel syndrome)   . MVP (mitral valve prolapse)   . RA (rheumatoid arthritis) (HCC)    FOLLOWED BY DR. SHANAHAN  . Raynaud's disease   . Scleroderma ([place])   . Seasonal allergies   . Thrombocytopenia ([place]) 07/01/2016   Acute fall to 13,000 07/01/16  . Tubular adenoma 01/08/2008   CECUM  . Vitamin D deficiency    Past Surgical History:  Past Surgical History:  Procedure Laterality Date  . ANKLE FRACTURE SURGERY Right   . AV FISTULA PLACEMENT Left 06/28/2016   Procedure: left arm ARTERIOVENOUS (AV) FISTULA CREATION;  Surgeon: Rosetta Posner, MD;  Location: [place] [facility];  Service: Vascular;  Laterality: Left;  . BASCILIC VEIN TRANSPOSITION Left 09/27/2016   Procedure: LEFT UPPER ARM CEPHALIC VEIN TRANSPOSITION;  Surgeon: Rosetta Posner, MD;  Location: [place];  Service: Vascular;  Laterality: Left;  . BREAST BIOPSY     "? side"  . CESAREAN SECTION  1994  . CO2 LASER OF CERVIX    . COLONOSCOPY W/ BIOPSIES  01/08/2008  . INSERTION OF DIALYSIS CATHETER Right 06/28/2016   Procedure: INSERTION OF DIALYSIS CATHETER, right internal jugular;  Surgeon: Rosetta Posner, MD;  Location: [place];  Service: Vascular;  Laterality: Right;  . MYOMECTOMY    . PELVIC LAPAROSCOPY  2011  . superficial thrombophlebitis Left 07-2014   Social History:  reports that she has never smoked. She has never used smokeless tobacco. She reports that she  does not drink alcohol and does not use drugs.  Family / Support Systems Marital Status: Married How Long?: 33 years Patient Roles: Spouse, Parent Spouse/Significant Other: Katie Nunez (husband): [phone] Children: 1 adult son (19 y.o) Other Supports: None reported Anticipated Caregiver: husband and son PRN Ability/Limitations of Caregiver: Pt reports her husband is able to provide intermittent support as he is a pastorl and states he can drive her to/from dialysis. States their son can help PRN as he is working. Caregiver Availability: Intermittent Family Dynamics: Pt lives with husband and son.  Social History Preferred language: English Religion: Baptist Cultural Background: Pt worked in Science writer for 30 years. Education: college grad Read: Yes Write: Yes Employment Status: Disabled Date Retired/Disabled/Unemployed: Unemployed since 2008 after lay off; states disability to started within last 2-3 years. Legal History/Current Legal Issues: Denies Guardian/Conservator: N/A   Abuse/Neglect Abuse/Neglect Assessment Can Be Completed: Yes Physical Abuse: Denies Verbal Abuse: Denies Sexual Abuse: Denies Exploitation of patient/patient's resources: Denies Self-Neglect: Denies  Emotional Status Pt's affect, behavior and adjustment status: Pt in good spirits at time of visit Recent Psychosocial Issues: Pt admits to some anxiety since this admission, otherwise has been doing well. Psychiatric History: Denies Substance Abuse History: Denies; reports etoh in past but nothing recently.  Patient / Family Perceptions, Expectations & Goals Pt/Family understanding of illness & functional limitations: Pt was resting during time of assessment and  was able to participate and answer all questions depsite having eyes closed as she said she would. Premorbid pt/family roles/activities: Independent Anticipated changes in roles/activities/participation: Assistance with ADLs/IADLs  Community  Resources Express Scripts: None Premorbid Home Care/DME Agencies: None Transportation available at discharge: husband Resource referrals recommended: Neuropsychology  Discharge Planning Living Arrangements: Spouse/significant other, Children Support Systems: Spouse/significant other, Children Type of Residence: Private residence Insurance Resources: Multimedia programmer (specify) ([place] Medicare) Museum/gallery curator Resources: SSD Financial Screen Referred: No Living Expenses: Medical laboratory scientific officer Management: Spouse, Patient Does the patient have any problems obtaining your medications?: No Care Coordinator Barriers to Discharge: Decreased caregiver support, Lack of/limited family support Care Coordinator Anticipated Follow Up Needs: HH/OP  Clinical Impression SW met with pt in room to introduce self, explain role, and discuss discharge process. No HCPOA. Not a veteran, and spouse is not a English as a second language teacher. DME: 3in1 BSC and cane. Pt reports she was transporting self to/form dialysis PTA. TTS at [facility]. Pt aware SW to follow-up with husband. SW updated [employer]. On pt admission and SW to follow-up once there is a d/c date. Reports pt goes to dialysis TTS at [facility] location.   3:16pm- SW called pt husband Katie Nunez [phone]) to introduce self but unable to leave message as voicemail was full.   Staci Carver A Drelyn Pistilli 12/19/2019, 3:54 PM

## 2019-12-19 NOTE — Progress Notes (Signed)
[facility] NEPHROLOGY PROGRESS NOTE  Assessment/ Plan: Pt is a 64 y.o. yo female with ESRD on HD presented with right hemiplegia secondary to  [place].  HD orders: Leslie T, Th,S 3.5 hrs 450/600 2.0 K/2.25 Ca 58.5 kg UFP 2 linear Na  L AVF -No Heparin -Mircera 50 mcg IV q 4 weeks -Hectorol 6 mcg IV TIW -Sensipar 30 mg PO TIW  # ICH-thought secondary to fairly new Coumadin therapy. Coumadin was held, reversing agents given, neurology managing. Repeat CT of head 12/02 without evidence of extension of hematoma. Now in CIR.  #ESRD:We will continue with TTS schedule.  Tolerated dialysis yesterday.  Next HD tomorrow.   #Hypertension/Volume- No evidence of volume overload. UF as tol next HD.   Monitor blood pressure.  #Anemia of ESRD:Hgb 11.5. No ESA needed.  #Secondary hyperparathyroidism: Phosphorus level elevated.  Increased PhosLo.  Continue Sensipar and Hectorol.  #pulmonary HTN. Follows with cardiology at [facility]. Currently on tadalafil and Uptravi for her pulmonary hypertension.Marland Kitchen Avoid hypotension.   Subjective: Seen and examined.  She is now moved to inpatient rehab.  No nausea vomiting chest pain or shortness of breath.  Her husband at bedside.  Objective Vital signs in last 24 hours: Vitals:   12/18/19 2001 12/18/19 2332 12/19/19 0500 12/19/19 0613  BP: 133/70 (!) 119/59  121/67  Pulse: 90 94  80  Resp: 16   16  Temp: 98.2 F (36.8 C)   97.9 F (36.6 C)  TempSrc: Oral   Oral  SpO2: 93%   93%  Weight:   55.2 kg   Height:       Weight change:   Intake/Output Summary (Last 24 hours) at 12/19/2019 1001 Last data filed at 12/19/2019 0859 Gross per 24 hour  Intake 240 ml  Output --  Net 240 ml       Labs: Basic Metabolic Panel: Recent Labs  Lab 12/13/19 1018 12/16/19 1717 12/18/19 0635  NA 141 134* 136  K 5.1 3.4* 3.6  CL 100 93* 96*  CO2 24 28 26   GLUCOSE 78 178* 90  BUN 51* 26* 46*  CREATININE 7.48* 4.78* 7.27*  CALCIUM 8.9 8.5* 8.7*   PHOS 7.7*  --  6.7*   Liver Function Tests: Recent Labs  Lab 12/12/19 1528 12/13/19 1018 12/18/19 0635  AST 35  --   --   ALT 25  --   --   ALKPHOS 152*  --   --   BILITOT 0.6  --   --   PROT 7.2  --   --   ALBUMIN 2.9* 2.8* 2.6*   No results for input(s): LIPASE, AMYLASE in the last 168 hours. No results for input(s): AMMONIA in the last 168 hours. CBC: Recent Labs  Lab 12/12/19 1528 12/12/19 1528 12/13/19 0828 12/18/19 0635 12/19/19 0442  WBC 3.3*   < > 3.8* 2.8* 2.9*  NEUTROABS 1.8  --   --   --  1.7  HGB 11.1*   < > 11.5* 10.4* 10.7*  HCT 34.6*   < > 36.7 32.5* 33.9*  MCV 94.8  --  94.6 92.1 92.9  PLT 131*   < > 131* 105* 101*   < > = values in this interval not displayed.   Cardiac Enzymes: No results for input(s): CKTOTAL, CKMB, CKMBINDEX, TROPONINI in the last 168 hours. CBG: Recent Labs  Lab 12/16/19 1737 12/16/19 1740 12/16/19 2200 12/17/19 0617 12/17/19 1144  GLUCAP 11* 119* 87 81 109*    Iron Studies:  No results for input(s): IRON, TIBC, TRANSFERRIN, FERRITIN in the last 72 hours. Studies/Results: No results found.  Medications: Infusions: . sodium chloride      Scheduled Medications: . ambrisentan  5 mg Oral Daily  . calcium acetate  1,334 mg Oral TID WC  . Chlorhexidine Gluconate Cloth  6 each Topical BID  . [START ON 12/20/2019] cinacalcet  30 mg Oral Q T,Th,Sa-HD  . [START ON 12/20/2019] doxercalciferol  6 mcg Intravenous Q T,Th,Sa-HD  . fluticasone  1 spray Each Nare Daily  . Gerhardt's butt cream   Topical QID  . hydrocerin   Topical BID  . hydrocortisone  25 mg Rectal BID  . loratadine  10 mg Oral Daily  . pantoprazole  40 mg Oral Daily  . pentoxifylline  400 mg Oral Daily  . Selexipag  800 mcg Oral BID  . [START ON 12/20/2019] senna-docusate  1 tablet Oral BID  . tadalafil  20 mg Oral Daily    have reviewed scheduled and prn medications.  Physical Exam: General: Comfortable, lying in bed Heart:RRR, s1s2 nl Lungs: Clear  b/l, no crackle Abdomen:soft, Non-tender, non-distended Extremities:No edema Dialysis Access: Left upper extremity AV fistula has good thrill.  Espn Zeman [name] Eulala Newcombe 12/19/2019,10:01 AM  LOS: 1 day  Pager: [phone]

## 2019-12-19 NOTE — Progress Notes (Signed)
Bilateral lower extremity venous study completed.      Please see CV Proc for preliminary results.   Thermon Zulauf, RVT  

## 2019-12-20 ENCOUNTER — Other Ambulatory Visit: Payer: Self-pay

## 2019-12-20 ENCOUNTER — Inpatient Hospital Stay (HOSPITAL_COMMUNITY): Payer: Medicare Other

## 2019-12-20 ENCOUNTER — Inpatient Hospital Stay (HOSPITAL_COMMUNITY): Payer: Medicare Other | Admitting: Occupational Therapy

## 2019-12-20 DIAGNOSIS — K7460 Unspecified cirrhosis of liver: Secondary | ICD-10-CM | POA: Diagnosis not present

## 2019-12-20 DIAGNOSIS — R040 Epistaxis: Secondary | ICD-10-CM

## 2019-12-20 DIAGNOSIS — I10 Essential (primary) hypertension: Secondary | ICD-10-CM | POA: Diagnosis not present

## 2019-12-20 DIAGNOSIS — D61818 Other pancytopenia: Secondary | ICD-10-CM | POA: Diagnosis not present

## 2019-12-20 DIAGNOSIS — L8990 Pressure ulcer of unspecified site, unspecified stage: Secondary | ICD-10-CM | POA: Insufficient documentation

## 2019-12-20 DIAGNOSIS — I619 Nontraumatic intracerebral hemorrhage, unspecified: Secondary | ICD-10-CM | POA: Diagnosis not present

## 2019-12-20 LAB — CBC
HCT: 33.4 % — ABNORMAL LOW (ref 36.0–46.0)
Hemoglobin: 11.1 g/dL — ABNORMAL LOW (ref 12.0–15.0)
MCH: 30.2 pg (ref 26.0–34.0)
MCHC: 33.2 g/dL (ref 30.0–36.0)
MCV: 90.8 fL (ref 80.0–100.0)
Platelets: 161 10*3/uL (ref 150–400)
RBC: 3.68 MIL/uL — ABNORMAL LOW (ref 3.87–5.11)
RDW: 17.2 % — ABNORMAL HIGH (ref 11.5–15.5)
WBC: 3.1 10*3/uL — ABNORMAL LOW (ref 4.0–10.5)
nRBC: 0 % (ref 0.0–0.2)

## 2019-12-20 LAB — RENAL FUNCTION PANEL
Albumin: 2.6 g/dL — ABNORMAL LOW (ref 3.5–5.0)
Anion gap: 14 (ref 5–15)
BUN: 56 mg/dL — ABNORMAL HIGH (ref 8–23)
CO2: 24 mmol/L (ref 22–32)
Calcium: 9 mg/dL (ref 8.9–10.3)
Chloride: 96 mmol/L — ABNORMAL LOW (ref 98–111)
Creatinine, Ser: 6.81 mg/dL — ABNORMAL HIGH (ref 0.44–1.00)
GFR, Estimated: 6 mL/min — ABNORMAL LOW (ref >60)
Glucose, Bld: 100 mg/dL — ABNORMAL HIGH (ref 70–99)
Phosphorus: 5.2 mg/dL — ABNORMAL HIGH (ref 2.5–4.6)
Potassium: 4.1 mmol/L (ref 3.5–5.1)
Sodium: 134 mmol/L — ABNORMAL LOW (ref 135–145)

## 2019-12-20 LAB — PROTIME-INR
INR: 1 (ref 0.8–1.2)
Prothrombin Time: 13.1 seconds (ref 11.4–15.2)

## 2019-12-20 LAB — APTT: aPTT: 32 seconds (ref 24–36)

## 2019-12-20 MED ORDER — CINACALCET HCL 30 MG PO TABS
ORAL_TABLET | ORAL | Status: AC
  Filled 2019-12-20: qty 1

## 2019-12-20 MED ORDER — CAMPHOR-MENTHOL 0.5-0.5 % EX LOTN
TOPICAL_LOTION | Freq: Two times a day (BID) | CUTANEOUS | Status: DC
  Administered 2019-12-22 – 2020-01-02 (×3): 1 via TOPICAL
  Filled 2019-12-20 (×2): qty 222

## 2019-12-20 MED ORDER — DOXERCALCIFEROL 4 MCG/2ML IV SOLN
INTRAVENOUS | Status: AC
  Filled 2019-12-20: qty 4

## 2019-12-20 MED ORDER — HYDROXYZINE HCL 25 MG PO TABS
25.0000 mg | ORAL_TABLET | Freq: Three times a day (TID) | ORAL | Status: DC | PRN
  Administered 2019-12-23 – 2019-12-30 (×5): 25 mg via ORAL
  Filled 2019-12-20 (×5): qty 1

## 2019-12-20 MED ORDER — CEPHALEXIN 250 MG PO CAPS: 500.0000 mg | ORAL_CAPSULE | ORAL | Status: DC

## 2019-12-20 MED ORDER — CEPHALEXIN 250 MG PO CAPS
500.0000 mg | ORAL_CAPSULE | Freq: Two times a day (BID) | ORAL | Status: DC
  Administered 2019-12-20 – 2019-12-24 (×7): 500 mg via ORAL
  Filled 2019-12-20 (×9): qty 2

## 2019-12-20 MED ORDER — OXYMETAZOLINE HCL 0.05 % NA SOLN
1.0000 | Freq: Two times a day (BID) | NASAL | Status: DC
  Administered 2019-12-20 – 2019-12-29 (×7): 1 via NASAL
  Filled 2019-12-20: qty 30

## 2019-12-20 NOTE — Progress Notes (Signed)
Pt off of floor at this time to HD. Pt is stable. Nose is no longer bleeding. Packing still in place. Spouse bedside at this time.

## 2019-12-20 NOTE — Progress Notes (Signed)
Occupational Therapy Session Note  Patient Details  Name: Katie Nunez MRN: [id number] Date of Birth: [birth year]  Today's Date: 12/20/2019 OT Individual Time: 4166-0630 OT Individual Time Calculation (min): 69 min    Short Term Goals: Week 1:  OT Short Term Goal 1 (Week 1): Pt will complete a stand pivot to toilet with mod A of 1. OT Short Term Goal 2 (Week 1): Pt will stand at toilet with mod A of 1. OT Short Term Goal 3 (Week 1): Pt will don shirt with mod A. OT Short Term Goal 4 (Week 1): Pt will don pants over feet with mod A.  Skilled Therapeutic Interventions/Progress Updates:    Patient greeted semi-reclined in bed, reporting small nose bleed out of L nostril. Nose bleed managed with cotton balls. Pt reported she had been incontinent of BM and needed to be changed. Pt able to roll to the R with min A for total A peri-care and brief change. Max A to roll back to the L 2/2 flaccid R UE. When rolling to her R, pt began bleeding out of her R nostril as well. Bleeding was still manageable with cotton balls. Pt needed max A to come to sitting EOB, then mod A stand-pivot to wc on stronger L Side. Pt brought to the sink to begin BADLs, when she began bleeding through cotton balls in both nostrils. OT called for assistance from nursing. OT assisted with managing tissues and cotton balls to clot nose, but nothing was helping. MD entered room to assist. OT provided pt 1/2 lap tray for R UE support as R UE flaccid with subluxation. Pt left in care of MD, PA, and nurse tech.   Therapy Documentation Precautions:  Precautions Precautions: Fall Precaution Comments: Rt hemiplegia - subluxation Restrictions Weight Bearing Restrictions: No Pain:  denies pain  Therapy/Group: Individual Therapy  Valma Cava 12/20/2019, 8:46 AM

## 2019-12-20 NOTE — Progress Notes (Signed)
Rounding RN and PA continue to be bedside at this time.

## 2019-12-20 NOTE — Progress Notes (Signed)
SLP Cancellation Note  Patient Details Name: YAMILETH HAYSE MRN: [id number] DOB: 01-18-1955   Cancelled treatment:   Patient missed 60 minutes of skilled SLP intervention due to significant nose bleed resulting in Rapid Response. Spoke with nursing team for hold on SLP tx at this time. Physician aware, considerations to send to ED if necessary. Will re-attempt as able. Continue with current plan of care.   Dewaine Conger 12/20/2019, 10:47 AM

## 2019-12-20 NOTE — Progress Notes (Signed)
Patient has bilateral rhino rocket and afrin has been applied. Pressure being applied by staff and continues to have trickle bleed when pressure relieved. . She continues to cough and remains aspiration risk. Attempts made to contact alternative ENT--called [facility] in attempts to reach Dr. Redmond Baseman. Spoke to their office manager who reported that providers in surgery at this time.   Recommendations made to sent patient to ED if needed--discussed with RR nurse regarding feasibility/timing of care in ED. Attempted to reach out to Dr. Benjamine Mola again--msg left of cell phone. Reached out to Dr. Greer Ee in surgery at [facility] and office recommended reaching out to Dr. Benjamine Mola. Will continue to monitor as vitals stable. Will order repeat CBC.

## 2019-12-20 NOTE — Progress Notes (Signed)
Patient ID: MACKENZYE MACKEL, female   DOB: [birth year], 64 y.o.   MRN: [id number]  SW received updates from [facility] reporting is established with agency for [facility]. Certification period ended on 12/18/2019. Pt will need new orders at d/c. SW to follow-up with agency.   SW met with pt and pt husband in room to review ELOS. SW to follow-up after team conference.   Loralee Pacas, MSW, [place] Office: [phone] Cell: [phone] Fax: [phone]

## 2019-12-20 NOTE — Progress Notes (Signed)
[facility] PHYSICAL MEDICINE & REHABILITATION PROGRESS NOTE   Subjective/Complaints: Pt developed significant nose bleed this morning when up with OT. Has had many of these episodes in the past apparently. Nose was packed with cotton swabs. I packed it with gauze x 2 with continued bleeding, pressure being applied by PA and other staff for 30+ minutes. RR RN came in to help, rhino rockets were inserted and along with pressure seem to be slowing bleed. Pt is coughing up some blood which is falling back into her oral pharyngeal area. Pt is comfortable otherwise and sitting in her chair at bedside   ROS: Limited due to acuity of situation  Objective:   VAS [place] LOWER EXTREMITY VENOUS (DVT)  Result Date: 12/19/2019  Lower Venous DVT Study Performing Technologist: Vonzell Schlatter RVT  Examination Guidelines: A complete evaluation includes B-mode imaging, spectral Doppler, color Doppler, and power Doppler as needed of all accessible portions of each vessel. Bilateral testing is considered an integral part of a complete examination. Limited examinations for reoccurring indications may be performed as noted. The reflux portion of the exam is performed with the patient in reverse Trendelenburg.  +---------+---------------+---------+-----------+----------+--------------+ RIGHT    CompressibilityPhasicitySpontaneityPropertiesThrombus Aging +---------+---------------+---------+-----------+----------+--------------+ CFV      Full           Yes      Yes                                 +---------+---------------+---------+-----------+----------+--------------+ SFJ      Full                                                        +---------+---------------+---------+-----------+----------+--------------+ FV Prox  Full                                                        +---------+---------------+---------+-----------+----------+--------------+ FV Mid   Full                                                         +---------+---------------+---------+-----------+----------+--------------+ FV DistalFull                                                        +---------+---------------+---------+-----------+----------+--------------+ PFV      Full                                                        +---------+---------------+---------+-----------+----------+--------------+ POP      Full           Yes      Yes                                 +---------+---------------+---------+-----------+----------+--------------+  PTV      Full                                                        +---------+---------------+---------+-----------+----------+--------------+ PERO     Full                                                        +---------+---------------+---------+-----------+----------+--------------+   +---------+---------------+---------+-----------+----------+--------------+ LEFT     CompressibilityPhasicitySpontaneityPropertiesThrombus Aging +---------+---------------+---------+-----------+----------+--------------+ CFV      Full           Yes      Yes                                 +---------+---------------+---------+-----------+----------+--------------+ SFJ      Full                                                        +---------+---------------+---------+-----------+----------+--------------+ FV Prox  Full                                                        +---------+---------------+---------+-----------+----------+--------------+ FV Mid   Full                                                        +---------+---------------+---------+-----------+----------+--------------+ FV DistalFull                                                        +---------+---------------+---------+-----------+----------+--------------+ PFV      Full                                                         +---------+---------------+---------+-----------+----------+--------------+ POP      Full           Yes      Yes                                 +---------+---------------+---------+-----------+----------+--------------+ PTV      Full                                                        +---------+---------------+---------+-----------+----------+--------------+  PERO     Full                                                        +---------+---------------+---------+-----------+----------+--------------+     Summary: RIGHT: - There is no evidence of deep vein thrombosis in the lower extremity.  - No cystic structure found in the popliteal fossa.  LEFT: - There is no evidence of deep vein thrombosis in the lower extremity.  - No cystic structure found in the popliteal fossa.  *See table(s) above for measurements and observations. Electronically signed by Servando Snare MD on 12/19/2019 at 4:50:49 PM.    Final    Recent Labs    12/18/19 0635 12/19/19 0442  WBC 2.8* 2.9*  HGB 10.4* 10.7*  HCT 32.5* 33.9*  PLT 105* 101*   Recent Labs    12/18/19 0635  NA 136  K 3.6  CL 96*  CO2 26  GLUCOSE 90  BUN 46*  CREATININE 7.27*  CALCIUM 8.7*    Intake/Output Summary (Last 24 hours) at 12/20/2019 6387 Last data filed at 12/20/2019 0800 Gross per 24 hour  Intake 467 ml  Output --  Net 467 ml     Pressure Injury 12/18/19 Sacrum Medial Stage 1 -  Intact skin with non-blanchable redness of a localized area usually over a bony prominence. Red, non-blanchable spot over sacrum area (Active)  12/18/19 1707  Location: Sacrum  Location Orientation: Medial  Staging: Stage 1 -  Intact skin with non-blanchable redness of a localized area usually over a bony prominence.  Wound Description (Comments): Red, non-blanchable spot over sacrum area  Present on Admission: Yes    Physical Exam:  Constitutional: No distress . Vital signs reviewed. HEENT: significant blood with llong clots,  left>right nare Neck: supple Cardiovascular: RRR without murmur. No JVD    Respiratory/Chest: occ cough d/t blood   GI/Abdomen: BS +, non-tender, non-distended Ext: no clubbing, cyanosis, or edema Psych: pleasant and cooperative Skin: Clean and intact without signs of breakdown, LUE fistula intact. Left calf wound packed. ?mild sacral redness Neuro: Pt is cognitively appropriate with normal insight, memory, and awareness. Cranial nerves 2-12 are intact. Seems to sense pain and LT in all 4's. Reflexes are 2+ in all 4's. Fine motor coordination is intact. No tremors. Motor function is grossly 5/5 LUE and LLE. 0/5 RUE and RLE.--limited neuro exam given bleeding .  Musculoskeletal: pain with cervical rom and palpation. Tightness in left trap, not right side so much this morning.  didn't appreciated TPI's.      Vital Signs Blood pressure 135/75, pulse 96, temperature (!) 97.4 F (36.3 C), temperature source Oral, resp. rate (!) 22, height 5\' 5"  (1.651 m), weight 55.2 kg, last menstrual period 11/15/2008, SpO2 95 %.    Assessment/Plan: 1. Functional deficits which require 3+ hours per day of interdisciplinary therapy in a comprehensive inpatient rehab setting.  Physiatrist is providing close team supervision and 24 hour management of active medical problems listed below.  Physiatrist and rehab team continue to assess barriers to discharge/monitor patient progress toward functional and medical goals  Care Tool:  Bathing    Body parts bathed by patient: Chest,Abdomen,Right upper leg,Left upper leg,Right arm,Face   Body parts bathed by helper: Left arm,Front perineal area,Buttocks,Right lower leg,Left lower leg     Bathing assist Assist  Level: Moderate Assistance - Patient 50 - 74%     Upper Body Dressing/Undressing Upper body dressing   What is the patient wearing?: Pull over shirt    Upper body assist Assist Level: Maximal Assistance - Patient 25 - 49%    Lower Body  Dressing/Undressing Lower body dressing      What is the patient wearing?: Underwear/pull up,Pants     Lower body assist Assist for lower body dressing: Total Assistance - Patient < 25%     Toileting Toileting    Toileting assist Assist for toileting: Total Assistance - Patient < 25%     Transfers Chair/bed transfer  Transfers assist     Chair/bed transfer assist level: Moderate Assistance - Patient 50 - 74%     Locomotion Ambulation   Ambulation assist      Assist level: 2 helpers Assistive device: Hand held assist Max distance: 30'   Walk 10 feet activity   Assist     Assist level: 2 helpers Assistive device: Hand held assist   Walk 50 feet activity   Assist Walk 50 feet with 2 turns activity did not occur: Safety/medical concerns         Walk 150 feet activity   Assist Walk 150 feet activity did not occur: Safety/medical concerns         Walk 10 feet on uneven surface  activity   Assist Walk 10 feet on uneven surfaces activity did not occur: Safety/medical concerns         Wheelchair     Assist Will patient use wheelchair at discharge?: Yes Type of Wheelchair: Manual Wheelchair activity did not occur: Safety/medical concerns         Wheelchair 50 feet with 2 turns activity    Assist    Wheelchair 50 feet with 2 turns activity did not occur: Safety/medical concerns       Wheelchair 150 feet activity     Assist  Wheelchair 150 feet activity did not occur: Safety/medical concerns       Blood pressure 135/75, pulse 96, temperature (!) 97.4 F (36.3 C), temperature source Oral, resp. rate (!) 22, height 5\' 5"  (1.651 m), weight 55.2 kg, last menstrual period 11/15/2008, SpO2 95 %.  Medical Problem List and Plan: 1.  L frontal ICH secondary to recent starting of Coumadin- Coumadin on hold with R hemiplegia             -patient may  shower             -ELOS/Goals: Supervision to mod I in 2-3  weeks  --Continue CIR therapies including PT, OT, and SLP. Hold therapy this am d/t epistaxis 2.  Portal vein thrombosis/A fib/Antithrombotics: -DVT/anticoagulation:  Pharmaceutical: SCDs due to thrombocytopenia and recent bleed.  --per neuro no coumadin-->will need to wait at least one month due to size of bleed.  --dopplers ok             -antiplatelet therapy: N/A 3. Pain Management: tylenol prn.   -add kpad for neck pain  -pt to work with pt today, might benefit from massage, K-tape  -low dose robaxin prn for spasms 4. Mood: LCSW to follow up for evaluation and support.              -antipsychotic agents: N/A 5. Neuropsych: This patient is capable of making decisions on her own behalf. 6. Wound ulcer/Skin/Wound Care:Pack LLE wound with iodoform dressing daily.    -turning, nutrition, education 7. Fluids/Electrolytes/Nutrition: Strict I/O. Labs  with HD on TTS.  8. ESRD: Continue HD on TTS at the end of the day to help with therapy tolerance.  9. HTN: Monitor BP tid-currently controlled on current regimen 10. Pulmonary HTN: Oxygen dependent. Continue Ambrisentan and Selexipag. On Home dose 2L by Kevin 11. Cirrhosis of the liver: Monitor platelets and for any signs of bleeding. Reporting abdominal distension.  (protal vein thrombus)  -pt asked about scheduled CT scan of liver prior to [place]. Told her we would monitor labs, clinically for now. Will likely wait until discharge 12. Chronic systolic CHF: Monitor daily weights as well as I/O. Continue tadalafil.    Filed Weights   12/18/19 1627 12/19/19 0500 12/20/19 0259  Weight: 59.8 kg 55.2 kg 55.2 kg    13. CAF: Monitor HR tid--metoprolol has been on hold. Coumadin to be held due to [place].  14. Pancytopenia: WBC 3.3-->2.8,  Platelets 130-->105, hgb 11.5-->10.4. Continue to monitor with serial checks.   -12/8 HGB/PLT stable today 10.7/101  12/9 recheck labs today  15. Constipation WITH hemorrhoids- suggest Sorbitol- suggested to PA?- also  might need Anusol.  -pt with multiple loose stools last night.   -hold am senna-s, resumed   -prn miralax only 16. Significant epistaxis (pt has prior history)  -appreciate PA and RR RN assistance  -afrin and rhino rocket seem to be slowing bleed  -have tried to contact ENT. Waiting for response  -check CBC, PTT, PT/INR today  -bed rest this morning    LOS: 2 days A FACE TO FACE EVALUATION WAS PERFORMED  Meredith Staggers 12/20/2019, 9:22 AM

## 2019-12-20 NOTE — Consult Note (Signed)
Reason for Consult: Severe epistaxis Referring Physician: Meredith Staggers, MD  HPI:  Katie Nunez is an 64 y.o. female who was initially admitted for right frontal ICH with cerebral edema. The patient has multiple medical problems, including cirrhosis, HTN, ESRD-TTS, Raynaud's, achalasia, PAH-oxygen dependent, and A fib. Her INR was 1.7 at admission and coumadin reversed with vitamin K. She developed severe bilateral epistaxis this morning. The bleeding was eventually controlled with rhinorocket packing in both nasal cavity. She has a history of recurrent epistaxis, s/p multiple cauterization procedures. She previously saw Dr. Ernesto Rutherford.  Past Medical History:  Diagnosis Date  . Achalasia   . Anxiety   . Dysplasia of cervix, low grade (CIN 1)   . Environmental allergies    "all year long" (12/27/2016)  . ESRD (end stage renal disease) on dialysis [facility])    "TTS; [place]" (12/27/2016)  . Fibromyalgia   . GERD (gastroesophageal reflux disease)   . Gout   . Hypertension   . IBS (irritable bowel syndrome)   . MVP (mitral valve prolapse)   . RA (rheumatoid arthritis) (HCC)    FOLLOWED BY DR. SHANAHAN  . Raynaud's disease   . Scleroderma ([place])   . Seasonal allergies   . Thrombocytopenia ([place]) 07/01/2016   Acute fall to 13,000 07/01/16  . Tubular adenoma 01/08/2008   CECUM  . Vitamin D deficiency     Past Surgical History:  Procedure Laterality Date  . ANKLE FRACTURE SURGERY Right   . AV FISTULA PLACEMENT Left 06/28/2016   Procedure: left arm ARTERIOVENOUS (AV) FISTULA CREATION;  Surgeon: Rosetta Posner, MD;  Location: [facility];  Service: Vascular;  Laterality: Left;  . BASCILIC VEIN TRANSPOSITION Left 09/27/2016   Procedure: LEFT UPPER ARM CEPHALIC VEIN TRANSPOSITION;  Surgeon: Rosetta Posner, MD;  Location: [facility];  Service: Vascular;  Laterality: Left;  . BREAST BIOPSY     "? side"  . CESAREAN SECTION  1994  . CO2 LASER OF CERVIX    . COLONOSCOPY W/ BIOPSIES  01/08/2008  .  INSERTION OF DIALYSIS CATHETER Right 06/28/2016   Procedure: INSERTION OF DIALYSIS CATHETER, right internal jugular;  Surgeon: Rosetta Posner, MD;  Location: [facility];  Service: Vascular;  Laterality: Right;  . MYOMECTOMY    . PELVIC LAPAROSCOPY  2011  . superficial thrombophlebitis Left 07-2014    Family History  Problem Relation Age of Onset  . Hypertension Mother   . Diabetes Mother   . Heart disease Father   . Hypertension Maternal Aunt   . Diabetes Maternal Grandmother   . Heart disease Paternal Grandfather   . Cerebral palsy Cousin        1ST COUSIN?  . Diabetes Paternal Grandmother     Social History:  reports that she has never smoked. She has never used smokeless tobacco. She reports that she does not drink alcohol and does not use drugs.  Allergies:  Allergies  Allergen Reactions  . Other Anaphylaxis    Do not use polyflux membrane.  Use alternate  . Savella [Milnacipran Hcl] Palpitations and Other (See Comments)    Unknown  . Tape Rash and Other (See Comments)    Itch- unsure if it was paper or adhesive tape    Prior to Admission medications   Medication Sig Start Date End Date Taking? Authorizing Provider  ambrisentan (LETAIRIS) 5 MG tablet Take 5 mg by mouth daily.     [provider]  calcium acetate (PHOSLO) 667 MG capsule Take 667  mg by mouth See admin instructions. Take 1 capsule with each meal and with each snack. 06/22/19   [provider]  cinacalcet (SENSIPAR) 30 MG tablet Take 30 mg by mouth See admin instructions. Tuesday Thursday saturday    [provider]  clonazePAM (KLONOPIN) 0.5 MG tablet Take 0.5 mg by mouth 2 (two) times daily as needed for anxiety.     [provider]  docusate sodium (COLACE) 100 MG capsule Take 2 capsules (200 mg total) by mouth 2 (two) times daily. Patient taking differently: Take 200 mg by mouth 2 (two) times daily as needed (for constipation).  07/07/16   Theodis Blaze, MD  famotidine (PEPCID)  20 MG tablet Take 20 mg by mouth 2 (two) times daily as needed for heartburn or indigestion.    [provider]  gabapentin (NEURONTIN) 100 MG capsule Take 100 mg by mouth 3 (three) times daily as needed (pain).     [provider]  hydrOXYzine (ATARAX/VISTARIL) 25 MG tablet Take 25 mg by mouth every 8 (eight) hours as needed for itching.  06/22/19   [provider]  pentoxifylline (TRENTAL) 400 MG CR tablet Take 400 mg by mouth daily.     [provider]  Selexipag (UPTRAVI) 800 MCG TABS Take 800 mcg by mouth in the morning and at bedtime.     [provider]  Spacer/Aero-Holding Josiah Lobo DEVI Use spacer with albuterol 11/11/17   Collene Gobble, MD  tadalafil, PAH, (ADCIRCA) 20 MG tablet Take 20 mg by mouth daily.  08/03/19   [provider]  traMADol (ULTRAM) 50 MG tablet Take 50 mg by mouth every 12 (twelve) hours as needed for pain. 10/29/19   [provider]  warfarin (COUMADIN) 5 MG tablet Take 10 mg by mouth daily.     [provider]    Medications:  I have reviewed the patient's current medications. Scheduled: . ambrisentan  5 mg Oral Daily  . calcium acetate  1,334 mg Oral TID WC  . camphor-menthol   Topical BID  . cephALEXin  500 mg Oral Q24H  . Chlorhexidine Gluconate Cloth  6 each Topical Q0600  . cinacalcet  30 mg Oral Q T,Th,Sa-HD  . doxercalciferol  6 mcg Intravenous Q T,Th,Sa-HD  . fluticasone  1 spray Each Nare Daily  . Gerhardt's butt cream   Topical QID  . hydrocerin   Topical BID  . hydrocortisone  25 mg Rectal BID  . loratadine  10 mg Oral Daily  . oxymetazoline  1 spray Each Nare BID  . pantoprazole  40 mg Oral Daily  . pentoxifylline  400 mg Oral Daily  . Selexipag  800 mcg Oral BID  . senna-docusate  1 tablet Oral BID  . tadalafil  20 mg Oral Daily   Continuous: . sodium chloride      Results for orders placed or performed during the hospital encounter of 12/18/19 (from the past 48  hour(s))  CBC with Differential/Platelet     Status: Abnormal   Collection Time: 12/19/19  4:42 AM  Result Value Ref Range   WBC 2.9 (L) 4.0 - 10.5 K/uL   RBC 3.65 (L) 3.87 - 5.11 MIL/uL   Hemoglobin 10.7 (L) 12.0 - 15.0 g/dL   HCT 33.9 (L) 36.0 - 46.0 %   MCV 92.9 80.0 - 100.0 fL   MCH 29.3 26.0 - 34.0 pg   MCHC 31.6 30.0 - 36.0 g/dL   RDW 17.2 (H) 11.5 - 15.5 %  Platelets 101 (L) 150 - 400 K/uL    Comment: Immature Platelet Fraction may be clinically indicated, consider ordering this additional test [id number]    nRBC 0.0 0.0 - 0.2 %   Neutrophils Relative % 58 %   Neutro Abs 1.7 1.7 - 7.7 K/uL   Lymphocytes Relative 19 %   Lymphs Abs 0.5 (L) 0.7 - 4.0 K/uL   Monocytes Relative 20 %   Monocytes Absolute 0.6 0.1 - 1.0 K/uL   Eosinophils Relative 2 %   Eosinophils Absolute 0.1 0.0 - 0.5 K/uL   Basophils Relative 1 %   Basophils Absolute 0.0 0.0 - 0.1 K/uL   Immature Granulocytes 0 %   Abs Immature Granulocytes 0.01 0.00 - 0.07 K/uL    Comment: Performed at [facility] [address] [place], [place] [address]  CBC     Status: Abnormal   Collection Time: 12/20/19 10:38 AM  Result Value Ref Range   WBC 3.1 (L) 4.0 - 10.5 K/uL   RBC 3.68 (L) 3.87 - 5.11 MIL/uL   Hemoglobin 11.1 (L) 12.0 - 15.0 g/dL   HCT 33.4 (L) 36.0 - 46.0 %   MCV 90.8 80.0 - 100.0 fL   MCH 30.2 26.0 - 34.0 pg   MCHC 33.2 30.0 - 36.0 g/dL   RDW 17.2 (H) 11.5 - 15.5 %   Platelets 161 150 - 400 K/uL   nRBC 0.0 0.0 - 0.2 %    Comment: Performed at [facility], [place] [address], [place], [place] [address]  APTT     Status: None   Collection Time: 12/20/19 10:38 AM  Result Value Ref Range   aPTT 32 24 - 36 seconds    Comment: Performed at [facility] [address], [place], [place] [address]  Protime-INR     Status: None   Collection Time: 12/20/19 10:38 AM  Result Value Ref Range   Prothrombin Time 13.1 11.4 - 15.2 seconds   INR 1.0 0.8 - 1.2    Comment: (NOTE) INR goal varies  based on device and disease states. Performed at [facility], [place] [address] [place], [place] [address]     Review of Systems  Constitutional: Negative for chills and fever.  HENT: Negative for hearing loss and tinnitus.  Positive for epistaxis. Eyes: Negative for blurred vision and double vision.  Respiratory: Positive for cough (on and off) and shortness of breath.   Cardiovascular: Negative for chest pain, palpitations and leg swelling.  Gastrointestinal: Positive for blood in stool, constipation, diarrhea and heartburn (swallow seems delayed). Negative for abdominal pain and nausea.  Musculoskeletal: Positive for myalgias and neck pain.  Skin: Negative for rash.  Neurological: Positive for focal weakness. Negative for headaches.  Psychiatric/Behavioral: The patient has insomnia. All other systems reviewed and are negative.  Blood pressure (!) 144/82, pulse 96, temperature (!) 97.4 F (36.3 C), temperature source Oral, resp. rate 20, height 5\' 5"  (1.651 m), weight 55.2 kg, last menstrual period 11/15/2008, SpO2 97 %. General appearance: alert, cooperative and no distress Head: Normocephalic, without obvious abnormality, atraumatic Eyes: Pupils are equal, round, reactive to light. Extraocular motion is intact.  Ears: Examination of the ears shows normal auricles and external auditory canals bilaterally.  Nose: Rhinorocket packing in both [place]. Slow oozing of blood is noted. Face: Facial examination shows no asymmetry. Palpation of the face elicit no significant tenderness.  Mouth: Oral cavity examination shows no mucosal lacerations. No significant trismus is noted.  Neck: Palpation of the neck reveals no lymphadenopathy or mass. The trachea is midline.   Assessment/Plan: Severe epistaxis in the setting of cirrhosis.  - Bleeding is now minimal with the nasal packing. Will leave packing in place for 5 days. - Change nasal drip pad as needed. - Oral antibiotic while packing is  in place (e.g. keflex). - Will follow.  Katie Nunez 12/20/2019, 12:21 PM

## 2019-12-20 NOTE — Progress Notes (Signed)
Pt had a slight noose bleed from Left nostril.C/o for dry air/O2. At home Pt was using humidified O2.  RN applied humidifier to Pt Oxygen.

## 2019-12-20 NOTE — Care Management (Signed)
[facility] Individual Statement of Services  Patient Name:  GEETIKA LABORDE  Date:  12/20/2019  Welcome to the [facility].  Our goal is to provide you with an individualized program based on your diagnosis and situation, designed to meet your specific needs.  With this comprehensive rehabilitation program, you will be expected to participate in at least 3 hours of rehabilitation therapies Monday-Friday, with modified therapy programming on the weekends.  Your rehabilitation program will include the following services:  Physical Therapy (PT), Occupational Therapy (OT), Speech Therapy (ST), 24 hour per day rehabilitation nursing, Therapeutic Recreaction (TR), Psychology, Neuropsychology, Care Coordinator, Rehabilitation Medicine, Nutrition Services, Pharmacy Services and Other  Weekly team conferences will be held on Tuesdays to discuss your progress.  Your Inpatient Rehabilitation Care Coordinator will talk with you frequently to get your input and to update you on team discussions.  Team conferences with you and your family in attendance may also be held.  Expected length of stay: 3 weeks   Overall anticipated outcome: Minimal Assistance  Depending on your progress and recovery, your program may change. Your Inpatient Rehabilitation Care Coordinator will coordinate services and will keep you informed of any changes. Your Inpatient Rehabilitation Care Coordinator's name and contact numbers are listed  below.  The following services may also be recommended but are not provided by the [facility] will be made to provide these services after discharge if needed.  Arrangements include referral to agencies that provide these services.  Your insurance has been verified to be:  [facility] Medicare  Your  primary doctor is:  [name]  Pertinent information will be shared with your doctor and your insurance company.  Inpatient Rehabilitation Care Coordinator:  Cathleen Corti [phone] or [phone]  Information discussed with and copy given to patient by: Rana Snare, 12/20/2019, 8:34 AM

## 2019-12-20 NOTE — Progress Notes (Signed)
RN called to room for pt having an active nose bleed. Attempts to apply pressure and ice to stop bleed. Suction bedside.   0840 MD Naaman Plummer at bedside at this time as well as PA

## 2019-12-20 NOTE — Progress Notes (Signed)
[facility] NEPHROLOGY PROGRESS NOTE  Assessment/ Plan: Pt is a 64 y.o. yo female with ESRD on HD presented with right hemiplegia secondary to  [place].  HD orders: [place] T, Th,S 3.5 hrs 450/600 2.0 K/2.25 Ca 58.5 kg UFP 2 linear Na  L AVF -No Heparin -Mircera 50 mcg IV q 4 weeks -Hectorol 6 mcg IV TIW -Sensipar 30 mg PO TIW  # ICH-thought secondary to fairly new Coumadin therapy. Coumadin was held, reversing agents given, neurology managing. Repeat CT of head 12/02 without evidence of extension of hematoma. Now in CIR.  #ESRD:We will continue with TTS schedule.  Plan for HD today.    #Hypertension/Volume- No evidence of volume overload. UF as tol during HD.   Monitor blood pressure.  #Anemia of ESRD:Hgb 11.5. No ESA needed. Some blood loss from epistaxis, avoiding anticoagulation.  #Secondary hyperparathyroidism: Phosphorus level elevated.  Increased PhosLo.  Continue Sensipar and Hectorol.  #pulmonary HTN. Follows with cardiology at [facility]. Currently on tadalafil and Uptravi for her pulmonary hypertension.Marland Kitchen Avoid hypotension.   Subjective: Seen and examined.  Some nasal bleeding probably due to nasal cannula, applied some gauze.  Denies nausea vomiting chest pain shortness of breath.  Plan for dialysis today.  Objective Vital signs in last 24 hours: Vitals:   12/19/19 1922 12/20/19 0259 12/20/19 0852 12/20/19 0923  BP: 129/66 130/70 135/75 135/73  Pulse: 80 77 96 97  Resp: 18 18 (!) 22   Temp: 98.5 F (36.9 C) (!) 97.4 F (36.3 C)    TempSrc: Oral Oral    SpO2: 99% 96% 95% 95%  Weight:  55.2 kg    Height:       Weight change: -4.6 kg  Intake/Output Summary (Last 24 hours) at 12/20/2019 0937 Last data filed at 12/20/2019 0800 Gross per 24 hour  Intake 467 ml  Output --  Net 467 ml       Labs: Basic Metabolic Panel: Recent Labs  Lab 12/13/19 1018 12/16/19 1717 12/18/19 0635  NA 141 134* 136  K 5.1 3.4* 3.6  CL 100 93* 96*  CO2 24 28 26    GLUCOSE 78 178* 90  BUN 51* 26* 46*  CREATININE 7.48* 4.78* 7.27*  CALCIUM 8.9 8.5* 8.7*  PHOS 7.7*  --  6.7*   Liver Function Tests: Recent Labs  Lab 12/13/19 1018 12/18/19 0635  ALBUMIN 2.8* 2.6*   No results for input(s): LIPASE, AMYLASE in the last 168 hours. No results for input(s): AMMONIA in the last 168 hours. CBC: Recent Labs  Lab 12/18/19 0635 12/19/19 0442  WBC 2.8* 2.9*  NEUTROABS  --  1.7  HGB 10.4* 10.7*  HCT 32.5* 33.9*  MCV 92.1 92.9  PLT 105* 101*   Cardiac Enzymes: No results for input(s): CKTOTAL, CKMB, CKMBINDEX, TROPONINI in the last 168 hours. CBG: Recent Labs  Lab 12/16/19 1737 12/16/19 1740 12/16/19 2200 12/17/19 0617 12/17/19 1144  GLUCAP 11* 119* 87 81 109*    Iron Studies: No results for input(s): IRON, TIBC, TRANSFERRIN, FERRITIN in the last 72 hours. Studies/Results: VAS [place] LOWER EXTREMITY VENOUS (DVT)  Result Date: 12/19/2019  Lower Venous DVT Study Performing Technologist: Vonzell Schlatter RVT  Examination Guidelines: A complete evaluation includes B-mode imaging, spectral Doppler, color Doppler, and power Doppler as needed of all accessible portions of each vessel. Bilateral testing is considered an integral part of a complete examination. Limited examinations for reoccurring indications may be performed as noted. The reflux portion of the exam is performed with the patient  in reverse Trendelenburg.  +---------+---------------+---------+-----------+----------+--------------+ RIGHT    CompressibilityPhasicitySpontaneityPropertiesThrombus Aging +---------+---------------+---------+-----------+----------+--------------+ CFV      Full           Yes      Yes                                 +---------+---------------+---------+-----------+----------+--------------+ SFJ      Full                                                        +---------+---------------+---------+-----------+----------+--------------+ FV Prox  Full                                                         +---------+---------------+---------+-----------+----------+--------------+ FV Mid   Full                                                        +---------+---------------+---------+-----------+----------+--------------+ FV DistalFull                                                        +---------+---------------+---------+-----------+----------+--------------+ PFV      Full                                                        +---------+---------------+---------+-----------+----------+--------------+ POP      Full           Yes      Yes                                 +---------+---------------+---------+-----------+----------+--------------+ PTV      Full                                                        +---------+---------------+---------+-----------+----------+--------------+ PERO     Full                                                        +---------+---------------+---------+-----------+----------+--------------+   +---------+---------------+---------+-----------+----------+--------------+ LEFT     CompressibilityPhasicitySpontaneityPropertiesThrombus Aging +---------+---------------+---------+-----------+----------+--------------+ CFV      Full           Yes      Yes                                 +---------+---------------+---------+-----------+----------+--------------+  SFJ      Full                                                        +---------+---------------+---------+-----------+----------+--------------+ FV Prox  Full                                                        +---------+---------------+---------+-----------+----------+--------------+ FV Mid   Full                                                        +---------+---------------+---------+-----------+----------+--------------+ FV DistalFull                                                         +---------+---------------+---------+-----------+----------+--------------+ PFV      Full                                                        +---------+---------------+---------+-----------+----------+--------------+ POP      Full           Yes      Yes                                 +---------+---------------+---------+-----------+----------+--------------+ PTV      Full                                                        +---------+---------------+---------+-----------+----------+--------------+ PERO     Full                                                        +---------+---------------+---------+-----------+----------+--------------+     Summary: RIGHT: - There is no evidence of deep vein thrombosis in the lower extremity.  - No cystic structure found in the popliteal fossa.  LEFT: - There is no evidence of deep vein thrombosis in the lower extremity.  - No cystic structure found in the popliteal fossa.  *See table(s) above for measurements and observations. Electronically signed by Servando Snare MD on 12/19/2019 at 4:50:49 PM.    Final     Medications: Infusions: . sodium chloride      Scheduled Medications: . ambrisentan  5 mg Oral Daily  . calcium acetate  1,334 mg Oral TID WC  .  cephALEXin  500 mg Oral Q24H  . Chlorhexidine Gluconate Cloth  6 each Topical Q0600  . cinacalcet  30 mg Oral Q T,Th,Sa-HD  . doxercalciferol  6 mcg Intravenous Q T,Th,Sa-HD  . fluticasone  1 spray Each Nare Daily  . Gerhardt's butt cream   Topical QID  . hydrocerin   Topical BID  . hydrocortisone  25 mg Rectal BID  . loratadine  10 mg Oral Daily  . oxymetazoline  1 spray Each Nare BID  . pantoprazole  40 mg Oral Daily  . pentoxifylline  400 mg Oral Daily  . Selexipag  800 mcg Oral BID  . senna-docusate  1 tablet Oral BID  . tadalafil  20 mg Oral Daily    have reviewed scheduled and prn medications.  Physical Exam: General: Comfortable, lying in bed, nose pack  with gauze Heart:RRR, s1s2 nl Lungs: Clear b/l, no crackle Abdomen:soft, Non-tender, non-distended Extremities:No edema Dialysis Access: Left upper extremity AV fistula has good thrill.  Amnah Breuer [name] Ayushi Pla 12/20/2019,9:37 AM  LOS: 2 days  Pager: [phone]

## 2019-12-20 NOTE — Significant Event (Addendum)
Rapid Response Event Note   Reason for Call :  Epistaxis  Initial Focused Assessment:  Patient with mild nose bleed overnight Now with extensive nose bleed, manual pressure held x 19min Patient is sitting up in her wheelchair.  PA holding manual pressure.  Blood is dripping constantly from her nose and she is frequently spitting out blood.  There is about 150cc  Bloody drainage in the suction canister.  BP 135/75  HR 96  RR 22 O2 sat 95% on RA  Interventions:  Rhino Rocket placed Afrin spray  ENT consulted Continued manual pressure held x 3 hours total About 300cc total in suction canister (sm amounts of ice and liquid given) Labs drawn  About 1110 stopped holding pressure Currently no blood dripping from nose and she is not spitting out blood into the suction.   Plan of Care:  RN to call if patient begins bleeding again.  Event Summary:   MD Notified: Waunita Schooner PA at bedside, Dr Naaman Plummer  Call Time: [phone] Arrival Time: 7619 End Time: [place]  Raliegh Ip, RN

## 2019-12-20 NOTE — Progress Notes (Signed)
PA Pam bedside has been holding pressure for greater than 30 mins. PA Linna Hoff is bedside as well.    Charge RN bedside and has call rounding RN  Rounding RN placed rhino rockets at this time  Order has been placed for Afrin  ENT has been consulted

## 2019-12-20 NOTE — Progress Notes (Signed)
Physical Therapy Session Note  Patient Details  Name: Katie Nunez MRN: [id number] Date of Birth: 1955-08-27  Today's Date: 12/20/2019 PT Missed Time: 62 Minutes Missed Time Reason: MD hold (Comment) (profuse nose bleed)  Short Term Goals: Week 1:  PT Short Term Goal 1 (Week 1): Pt will complete bed mobility with minA PT Short Term Goal 2 (Week 1): Pt will perform bed to chair transfer with minA PT Short Term Goal 3 (Week 1): Pt will perform sit to stand with minA PT Short Term Goal 4 (Week 1): Pt will ambulate x30' with modA +1.  Skilled Therapeutic Interventions/Progress Updates:     Deferred by PA Algis Liming due to pt's profuse nose bleed. PT will follow up as appropriate.  Therapy Documentation Precautions:  Precautions Precautions: Fall Precaution Comments: Rt hemiplegia - subluxation Restrictions Weight Bearing Restrictions: No   Therapy/Group: Individual Therapy  Katie Nunez, PT, DPT 12/20/2019, 11:59 AM

## 2019-12-21 ENCOUNTER — Inpatient Hospital Stay (HOSPITAL_COMMUNITY): Payer: Medicare Other

## 2019-12-21 ENCOUNTER — Inpatient Hospital Stay (HOSPITAL_COMMUNITY): Payer: Medicare Other | Admitting: Occupational Therapy

## 2019-12-21 ENCOUNTER — Inpatient Hospital Stay (HOSPITAL_COMMUNITY): Payer: Medicare Other | Admitting: Speech Pathology

## 2019-12-21 MED ORDER — SALINE SPRAY 0.65 % NA SOLN
1.0000 | Freq: Every day | NASAL | Status: DC
  Administered 2019-12-21 – 2020-01-23 (×106): 1 via NASAL
  Filled 2019-12-21 (×3): qty 44

## 2019-12-21 MED ORDER — PSEUDOEPHEDRINE HCL 30 MG PO TABS
30.0000 mg | ORAL_TABLET | Freq: Once | ORAL | Status: AC
  Administered 2019-12-21: 30 mg via ORAL
  Filled 2019-12-21: qty 1

## 2019-12-21 MED ORDER — LUBIPROSTONE 8 MCG PO CAPS
8.0000 ug | ORAL_CAPSULE | Freq: Two times a day (BID) | ORAL | Status: DC
  Administered 2019-12-23: 08:00:00 8 ug via ORAL
  Filled 2019-12-21 (×8): qty 1

## 2019-12-21 MED ORDER — CHLORHEXIDINE GLUCONATE CLOTH 2 % EX PADS
6.0000 | MEDICATED_PAD | Freq: Every day | CUTANEOUS | Status: DC
  Administered 2019-12-21 – 2019-12-29 (×2): 6 via TOPICAL

## 2019-12-21 NOTE — Progress Notes (Addendum)
Patient refused milk and molasses enema tonight. She said she will take it after dialysis tomorrow. Patient had a medium sized soft bowel movement tonight. No nosebleed tonight, rhino racket to right nare in place.   Patient asked to remove scd, R splint and R boot for c/o not being comfortable.Trazodone given for insomnia. Will continue to monitor.

## 2019-12-21 NOTE — IPOC Note (Signed)
Overall Plan of Care [facility]) Patient Details Name: YARITSA SAVARINO MRN: [id number] DOB: Jan 03, 1956  Admitting Diagnosis: Intraparenchymal hemorrhage of brain [facility])  Hospital Problems: Principal Problem:   Intraparenchymal hemorrhage of brain ([place]) Active Problems:   Pressure injury of skin     Functional Problem List: Nursing Bowel,Skin Integrity,Pain,Sensory,Endurance,Edema,Motor,Medication Management  PT Balance,Endurance,Motor,Pain,Perception,Safety,Skin Integrity  OT Balance,Endurance,Motor,Vision  SLP    TR         Basic ADL's: OT Eating,Grooming,Bathing,Dressing,Toileting     Advanced  ADL's: OT       Transfers: PT Bed Mobility,Bed to Chair,Car  OT Toilet,Tub/Shower     Locomotion: PT Wheelchair Mobility,Stairs     Additional Impairments: OT Fuctional Use of Upper Extremity  SLP Social Cognition   Memory,Problem Solving,Attention  TR      Anticipated Outcomes Item Anticipated Outcome  Self Feeding mod I  Swallowing      Basic self-care  min A  Toileting  min A   Bathroom Transfers min A  Bowel/Bladder  Pt will manage bowel and bladder with min assist  Transfers  CGA  Locomotion  minA household distances  Communication     Cognition  Mod I  Pain  Pt will manage pain at 3 or less on a scale of 0-10.  Safety/Judgment  Pt will remain free of falls with injury while in rehab   Therapy Plan: PT Intensity: Minimum of 1-2 x/day ,45 to 90 minutes PT Frequency: 5 out of 7 days PT Duration Estimated Length of Stay: 3-3.5 weeks OT Intensity: Minimum of 1-2 x/day, 45 to 90 minutes OT Frequency: 5 out of 7 days OT Duration/Estimated Length of Stay: 3 weeks SLP Intensity: Minumum of 1-2 x/day, 30 to 90 minutes SLP Frequency: 3 to 5 out of 7 days SLP Duration/Estimated Length of Stay: 3 weeks   Due to the current state of emergency, patients may not be receiving their 3-hours of Medicare-mandated therapy.   Team Interventions: Nursing  Interventions Patient/Family Education,Bowel Management,Medication Management,Pain Management,Discharge Planning,Skin Care/Wound Management,Disease Management/Prevention  PT interventions Ambulation/gait training,Cognitive remediation/compensation,Discharge planning,DME/adaptive equipment instruction,Functional mobility training,Pain management,Psychosocial support,Splinting/orthotics,Therapeutic Activities,UE/LE Strength taining/ROM,Visual/perceptual remediation/compensation,Balance/vestibular training,Community reintegration,Disease management/prevention,Functional electrical stimulation,Neuromuscular re-education,Skin care/wound management,Patient/family education,Stair training,Therapeutic Exercise,UE/LE Museum/gallery conservator propulsion/positioning  OT Interventions Balance/vestibular training,Discharge Pharmacist, hospital stimulation,Functional mobility training,Psychosocial support,Patient/family education,Neuromuscular re-education,Self Care/advanced ADL retraining,Therapeutic Activities,Visual/perceptual remediation/compensation,UE/LE Coordination activities,UE/LE Strength taining/ROM,Therapeutic Exercise  SLP Interventions Cognitive remediation/compensation,Internal/external aids,Cueing hierarchy,Environmental controls,Therapeutic Activities,Functional tasks,Patient/family education  TR Interventions    SW/CM Interventions Discharge Planning,Psychosocial Support,Patient/Family Education   Barriers to Discharge MD  Medical stability  Nursing      PT Hemodialysis,Home environment access/layout    OT      SLP      SW Decreased caregiver support,Lack of/limited family support     Team Discharge Planning: Destination: PT-Home ,OT- Home , SLP-Home Projected Follow-up: PT-Home health PT,24 hour supervision/assistance, OT-  Home health OT, SLP- (TBD) Projected Equipment Needs: PT-To be determined, OT- To be determined, SLP-None  recommended by SLP Equipment Details: PT- , OT-  Patient/family involved in discharge planning: PT- Patient,  OT-Patient, SLP-Patient  MD ELOS: 3 weeks Medical Rehab Prognosis:  Excellent Assessment: The patient has been admitted for CIR therapies with the diagnosis of left frontal ICH, complicated by cirrhosis of liver, severe epistaxis. The team will be addressing functional mobility, strength, stamina, balance, safety, adaptive techniques and equipment, self-care, bowel and bladder mgt, patient and caregiver education, NMR, skin care needs, communication, cognition. Goals have been set at min assist with self-care, min assist to  CGA with mobility and mod I with cognition and communication.   Due to the current state of emergency, patients may not be receiving their 3 hours per day of Medicare-mandated therapy.    Meredith Staggers, MD, FAAPMR      See Team Conference Notes for weekly updates to the plan of care

## 2019-12-21 NOTE — Progress Notes (Signed)
Speech Language Pathology Daily Session Note  Patient Details  Name: Katie Nunez MRN: [id number] Date of Birth: [birth year]  Today's Date: 12/21/2019 SLP Individual Time: 8250-0370 SLP Individual Time Calculation (min): 55 min  Short Term Goals: Week 1: SLP Short Term Goal 1 (Week 1): Patient will demonstrate complex problem solving for functional and familiar tasks with supervision verbal cues. SLP Short Term Goal 2 (Week 1): Patient will recall new, daily information with Min verbal cues for use of memory compensatory strategies. SLP Short Term Goal 3 (Week 1): Patient will demonstrate selective attention to tasks in a mildly distracting enviornment for 45 minutes with Min verbal cues for redirection.  Skilled Therapeutic Interventions: Skilled treatment session focused on cognitive goals. Upon arrival, patient was awake while upright in the wheelchair but appeared lethargic. Patient participated in a basic money management task with Mod I. Patient also participated in a basic medication management task with overall Min A verbal cues. Patient requested to use the bathroom and was transferred to the commode with Mod A via the [facility] for safety. Patient left on commode with NT present. Continue with current plan of care.        Pain No/Denies Pain   Therapy/Group: Individual Therapy  Rocklyn Mayberry 12/21/2019, 12:35 PM

## 2019-12-21 NOTE — Progress Notes (Signed)
[facility] NEPHROLOGY PROGRESS NOTE  Assessment/ Plan: Pt is a 64 y.o. yo female with ESRD on HD presented with right hemiplegia secondary to  [place].  HD orders: [place] T, Th,S 3.5 hrs 450/600 2.0 K/2.25 Ca 58.5 kg UFP 2 linear Na  L AVF -No Heparin -Mircera 50 mcg IV q 4 weeks -Hectorol 6 mcg IV TIW -Sensipar 30 mg PO TIW  # ICH-thought secondary to fairly new Coumadin therapy. Coumadin was held, reversing agents given, neurology managing. Repeat CT of head 12/02 without evidence of extension of hematoma. Now in CIR.  #ESRD:We will continue with TTS schedule.  1 L UF yesterday, tolerated well.  Plan for next HD tomorrow.  #Hypertension/Volume- No evidence of volume overload. UF as tol during HD.   Monitor blood pressure.  #Anemia of ESRD:Hgb at goal. No ESA needed. Some blood loss from epistaxis, avoiding anticoagulation.  #Secondary hyperparathyroidism: Phosphorus level improved with higher dose of PhosLo.  Continue Sensipar and Hectorol.  #pulmonary HTN. Follows with cardiology at [facility]. Currently on tadalafil and Uptravi for her pulmonary hypertension.Marland Kitchen Avoid hypotension.   #Epistaxis: Seen by ENT, has nasal packing.  On Keflex  Subjective: Seen and examined.  Seen by ENT for epistaxis, bleeding is stopped.  She has nasal packing.  Tolerated dialysis well.  Denies nausea vomiting chest pain shortness of breath. Objective Vital signs in last 24 hours: Vitals:   12/20/19 1713 12/20/19 1847 12/20/19 1942 12/21/19 0413  BP: 130/70 (!) 148/93 126/65 119/66  Pulse: 96 99 98 90  Resp: 17  19 16   Temp: 98.1 F (36.7 C)  98.8 F (37.1 C) 98.3 F (36.8 C)  TempSrc: Oral  Oral Oral  SpO2: 99%  98% 90%  Weight:    58.9 kg  Height:       Weight change: 3.7 kg  Intake/Output Summary (Last 24 hours) at 12/21/2019 0934 Last data filed at 12/21/2019 0830 Gross per 24 hour  Intake 560 ml  Output 1000 ml  Net -440 ml       Labs: Basic Metabolic  Panel: Recent Labs  Lab 12/16/19 1717 12/18/19 0635 12/20/19 1358  NA 134* 136 134*  K 3.4* 3.6 4.1  CL 93* 96* 96*  CO2 28 26 24   GLUCOSE 178* 90 100*  BUN 26* 46* 56*  CREATININE 4.78* 7.27* 6.81*  CALCIUM 8.5* 8.7* 9.0  PHOS  --  6.7* 5.2*   Liver Function Tests: Recent Labs  Lab 12/18/19 0635 12/20/19 1358  ALBUMIN 2.6* 2.6*   No results for input(s): LIPASE, AMYLASE in the last 168 hours. No results for input(s): AMMONIA in the last 168 hours. CBC: Recent Labs  Lab 12/18/19 0635 12/19/19 0442 12/20/19 1038  WBC 2.8* 2.9* 3.1*  NEUTROABS  --  1.7  --   HGB 10.4* 10.7* 11.1*  HCT 32.5* 33.9* 33.4*  MCV 92.1 92.9 90.8  PLT 105* 101* 161   Cardiac Enzymes: No results for input(s): CKTOTAL, CKMB, CKMBINDEX, TROPONINI in the last 168 hours. CBG: Recent Labs  Lab 12/16/19 1737 12/16/19 1740 12/16/19 2200 12/17/19 0617 12/17/19 1144  GLUCAP 11* 119* 87 81 109*    Iron Studies: No results for input(s): IRON, TIBC, TRANSFERRIN, FERRITIN in the last 72 hours. Studies/Results: VAS [place] LOWER EXTREMITY VENOUS (DVT)  Result Date: 12/19/2019  Lower Venous DVT Study Performing Technologist: Vonzell Schlatter RVT  Examination Guidelines: A complete evaluation includes B-mode imaging, spectral Doppler, color Doppler, and power Doppler as needed of all accessible portions of each  vessel. Bilateral testing is considered an integral part of a complete examination. Limited examinations for reoccurring indications may be performed as noted. The reflux portion of the exam is performed with the patient in reverse Trendelenburg.  +---------+---------------+---------+-----------+----------+--------------+ RIGHT    CompressibilityPhasicitySpontaneityPropertiesThrombus Aging +---------+---------------+---------+-----------+----------+--------------+ CFV      Full           Yes      Yes                                  +---------+---------------+---------+-----------+----------+--------------+ SFJ      Full                                                        +---------+---------------+---------+-----------+----------+--------------+ FV Prox  Full                                                        +---------+---------------+---------+-----------+----------+--------------+ FV Mid   Full                                                        +---------+---------------+---------+-----------+----------+--------------+ FV DistalFull                                                        +---------+---------------+---------+-----------+----------+--------------+ PFV      Full                                                        +---------+---------------+---------+-----------+----------+--------------+ POP      Full           Yes      Yes                                 +---------+---------------+---------+-----------+----------+--------------+ PTV      Full                                                        +---------+---------------+---------+-----------+----------+--------------+ PERO     Full                                                        +---------+---------------+---------+-----------+----------+--------------+   +---------+---------------+---------+-----------+----------+--------------+ LEFT     CompressibilityPhasicitySpontaneityPropertiesThrombus Aging +---------+---------------+---------+-----------+----------+--------------+  CFV      Full           Yes      Yes                                 +---------+---------------+---------+-----------+----------+--------------+ SFJ      Full                                                        +---------+---------------+---------+-----------+----------+--------------+ FV Prox  Full                                                         +---------+---------------+---------+-----------+----------+--------------+ FV Mid   Full                                                        +---------+---------------+---------+-----------+----------+--------------+ FV DistalFull                                                        +---------+---------------+---------+-----------+----------+--------------+ PFV      Full                                                        +---------+---------------+---------+-----------+----------+--------------+ POP      Full           Yes      Yes                                 +---------+---------------+---------+-----------+----------+--------------+ PTV      Full                                                        +---------+---------------+---------+-----------+----------+--------------+ PERO     Full                                                        +---------+---------------+---------+-----------+----------+--------------+     Summary: RIGHT: - There is no evidence of deep vein thrombosis in the lower extremity.  - No cystic structure found in the popliteal fossa.  LEFT: - There is no evidence of deep vein thrombosis in the lower extremity.  - No cystic structure found in the popliteal fossa.  *  See table(s) above for measurements and observations. Electronically signed by Servando Snare MD on 12/19/2019 at 4:50:49 PM.    Final     Medications: Infusions: . sodium chloride      Scheduled Medications: . ambrisentan  5 mg Oral Daily  . calcium acetate  1,334 mg Oral TID WC  . camphor-menthol   Topical BID  . cephALEXin  500 mg Oral Q12H  . Chlorhexidine Gluconate Cloth  6 each Topical Q0600  . cinacalcet  30 mg Oral Q T,Th,Sa-HD  . doxercalciferol  6 mcg Intravenous Q T,Th,Sa-HD  . fluticasone  1 spray Each Nare Daily  . Gerhardt's butt cream   Topical QID  . hydrocerin   Topical BID  . hydrocortisone  25 mg Rectal BID  . loratadine  10 mg Oral Daily   . oxymetazoline  1 spray Each Nare BID  . pantoprazole  40 mg Oral Daily  . pentoxifylline  400 mg Oral Daily  . Selexipag  800 mcg Oral BID  . senna-docusate  1 tablet Oral BID  . sodium chloride  1 spray Each Nare 5 X Daily  . tadalafil  20 mg Oral Daily    have reviewed scheduled and prn medications.  Physical Exam: General: NAD, has nasal packing Heart:RRR, s1s2 nl Lungs: Clear b/l, no crackle Abdomen:soft, Non-tender, non-distended Extremities:No edema Dialysis Access: Left upper extremity AV fistula has good thrill.  Samir Ishaq [name] Asencion Guisinger 12/21/2019,9:34 AM  LOS: 3 days  Pager: [phone]

## 2019-12-21 NOTE — Progress Notes (Signed)
Physical Therapy Session Note  Patient Details  Name: Katie Nunez MRN: [id number] Date of Birth: 1955-06-11  Today's Date: 12/21/2019 PT Individual Time: 0801-0900 PT Individual Time Calculation (min): 59 min   Short Term Goals: Week 1:  PT Short Term Goal 1 (Week 1): Pt will complete bed mobility with minA PT Short Term Goal 2 (Week 1): Pt will perform bed to chair transfer with minA PT Short Term Goal 3 (Week 1): Pt will perform sit to stand with minA PT Short Term Goal 4 (Week 1): Pt will ambulate x30' with modA +1.  Skilled Therapeutic Interventions/Progress Updates:     Pt received supine in bed and agrees to therapy. Complains of spasming pain in R leg primarily. PT provides education, repositioning, and alerts MD to pain complaints, to address pain symptoms. Supine to sit with modA and cues for sequencing and body mechanics. Pt performs stand pivot transfer to [place] with modA. Sit to stand with modA and PT assists with doffing brief during transfer to toilet. PT performs pericare while providing modA for static standing at toilet. WC transport to gym for time management. Pt performs squat pivot transfer to mat table toward R to encourage increased WB on hemiparetic side. From mat, pt performs NMR for standing balance and R hemibody motor re-ed, utilizing mirror for visual feedback and performing multiple reps of sit to stand with modA at R side. In standing, pt performs weight shifting from L<>R with PT blocking R knee and providing manual facilitation of R hip abductor activation. Pt verbalizes that she feels like she is getting some activation of muscles in hip such as hip extension and hip abduction. No muscular activation noted distal to hip. Squat pivot back to WC. Pt left seated in WC with lap tray, alarm intact and all needs within reach.  Therapy Documentation Precautions:  Precautions Precautions: Fall Precaution Comments: Rt hemiplegia - subluxation Restrictions Weight  Bearing Restrictions: No    Therapy/Group: Individual Therapy  Breck Coons, PT, DPT 12/21/2019, 4:05 PM

## 2019-12-21 NOTE — Progress Notes (Signed)
Occupational Therapy Session Note  Patient Details  Name: Katie Nunez MRN: [id number] Date of Birth: 03-14-1955  Today's Date: 12/21/2019  Session 1 OT Individual Time: 1120-1203 OT Individual Time Calculation (min): 43 min   Session 2 OT Individual Time: 1400-1500 OT Individual Time Calculation (min): 60 min   Short Term Goals: Week 1:  OT Short Term Goal 1 (Week 1): Pt will complete a stand pivot to toilet with mod A of 1. OT Short Term Goal 2 (Week 1): Pt will stand at toilet with mod A of 1. OT Short Term Goal 3 (Week 1): Pt will don shirt with mod A. OT Short Term Goal 4 (Week 1): Pt will don pants over feet with mod A.  Skilled Therapeutic Interventions/Progress Updates:  Session 1   Patient greeted semi-reclined in bed and agreeable to OT treatment session.  Addressed functional transfers, functional use of R UE, improved sit<>stand, standing tolerance, and adapted bathing/dressing skills. Pt came to sitting EOB with mod A. Stand-pivot transfers bed>wc with overall mod A. Incorporated R NMR weight bearing techniques within bathing tasks with hand over hand A to. Mod/max A for balance when standing to reaching to wash buttocks. LB/UB dressing completed wc level at the sink with assistance to thread R LE into clothing and assist to pull up pants. UB dressing with max A overall 2/2 R hemiplegia. Incorporate weight bearing through R UE when standing at the sink.  OT placed SAEBO e-stim to shoulder for subluxation. SAEBO left on for 60 minutes. OT returned to remove SAEBO with skin intact and no adverse reactions.  [place] Stim One 330 pulse width 35 Hz pulse rate On 8 sec/ off 8 sec Ramp up/ down 2 sec Symmetrical Biphasic wave form  Max intensity 162m at 500 Ohm load Pt left seated in wc at end of session with safety belt on, call bell in reach and needs met.   Session 2 Pt seen for makeup time after missing therapy yesterday 2/2 nose bleed. Pt greeted semi-reclined in bed  and agreeable to OT treatment session. Pt completed bed mobility with mod A. Mod A stand-pivot to wc. Pt reported need to go to the bathroom. Mod A stand-pivot to commode and total A for clothing management. Pt with small BM and total A for peri-care. Pt pivoted back to wc in similar fashion. Pt brought down to therapy gym for R UE NMR. Weight bearing towel pushes in standing with OT assist to facilitate shoulder flexion/extension. OT provided joint input through wrist/hand while bringing R UE through full ROM. Pt returned to room and pivoted back to bed with mod A. Pt left semi-reclined in bed with bed alarm on and needs met.   Therapy Documentation Precautions:  Precautions Precautions: Fall Precaution Comments: Rt hemiplegia - subluxation Restrictions Weight Bearing Restrictions: No Pain:   Patient denies pain   Therapy/Group: Individual Therapy  EValma Cava12/10/2019, 3:11 PM

## 2019-12-21 NOTE — Progress Notes (Signed)
RT came by to set up Aerosol face tent per nurse request. Pt states that she feels claustrophobic with the mask on. [place] placed back on pt. Family at bedside. RN aware.

## 2019-12-21 NOTE — Progress Notes (Signed)
Physical Therapy Session Note  Patient Details  Name: Katie Nunez MRN: [id number] Date of Birth: Apr 20, 1955  Today's Date: 12/21/2019 PT Individual Time: 1305-1350 PT Individual Time Calculation (min): 45 min   Short Term Goals: Week 1:  PT Short Term Goal 1 (Week 1): Pt will complete bed mobility with minA PT Short Term Goal 2 (Week 1): Pt will perform bed to chair transfer with minA PT Short Term Goal 3 (Week 1): Pt will perform sit to stand with minA PT Short Term Goal 4 (Week 1): Pt will ambulate x30' with modA +1.  Skilled Therapeutic Interventions/Progress Updates:    Patient in w/c in room completing lunch.  She reports it only came 10 minutes prior, but feels she is finished.  Reports the IV in R wrist not being used and risk for dislodging it using wrist splint recently delivered.  RN made aware.  Patient pushed in w/c to ortho gym.  Performed stand pivot transfer mod A with cues.  Seated scapular squeezes and anterior pelvic tilt for balance/posture with 5 sec holds x 5 reps.  Sit to stand with mod A and A for R knee stabilization in stance with cues and facilitation for R hip extension to stand and reach to R for horseshoes to toss with L hand to target.  Reaching with +2 A for safety for preventing R knee buckle.  Patient sit to supine on mat min A for R LE using wedge for comfort due to still with nasal packing after nose bleeding yesterday.  Patient in supine performed bridging and lateral trunk rotation with A for R LE throughout.  Patient rolled to R and pushed up to sit with A for R LE.  Performed squat pivot to w/c with mod A and max cues and demonstration of technique.  Pushed in w/c to room and stand pivot to bed mod A.  Patient to supine min A for R LE.  Positioned in bed with extra time and mod to max A for scooting up in bed.  Left with bed alarm on and needs in reach.  Therapy Documentation Precautions:  Precautions Precautions: Fall Precaution Comments: Rt  hemiplegia - subluxation Restrictions Weight Bearing Restrictions: No Pain: Pain Assessment Pain Score: 0-No pain    Therapy/Group: Individual Therapy  Reginia Naas  Magda Kiel, PT 12/21/2019, 4:32 PM

## 2019-12-21 NOTE — Progress Notes (Signed)
Subjective: No issues overnight. The left nasal packing came out last night. No more bleeding.  Objective: Vital signs in last 24 hours: Temp:  [97.8 F (36.6 C)-98.8 F (37.1 C)] 98.3 F (36.8 C) (12/10 0413) Pulse Rate:  [86-100] 90 (12/10 0413) Resp:  [16-22] 16 (12/10 0413) BP: (119-158)/(62-93) 119/66 (12/10 0413) SpO2:  [90 %-99 %] 90 % (12/10 0413) Weight:  [58.9 kg] 58.9 kg (12/10 0413)  Physical Exam: General appearance: alert, cooperative and no distress Head: Normocephalic, without obvious abnormality, atraumatic Eyes: Pupils are equal, round, reactive to light. Extraocular motion is intact.  Ears: Examination of the ears shows normal auricles and external auditory canals bilaterally.  Nose: Rhinorocket packing in right [place]. No bleeding is noted today. Face: Facial examination shows no asymmetry. Palpation of the face elicit no significant tenderness.  Mouth: Oral cavity examination shows no mucosal lacerations. No significant trismus is noted.  Neck: Palpation of the neck reveals no lymphadenopathy or mass. The trachea is midline.    Recent Labs    12/19/19 0442 12/20/19 1038  WBC 2.9* 3.1*  HGB 10.7* 11.1*  HCT 33.9* 33.4*  PLT 101* 161   Recent Labs    12/20/19 1358  NA 134*  K 4.1  CL 96*  CO2 24  GLUCOSE 100*  BUN 56*  CREATININE 6.81*  CALCIUM 9.0    Medications:  I have reviewed the patient's current medications. Scheduled: . ambrisentan  5 mg Oral Daily  . calcium acetate  1,334 mg Oral TID WC  . camphor-menthol   Topical BID  . cephALEXin  500 mg Oral Q12H  . Chlorhexidine Gluconate Cloth  6 each Topical Q0600  . Chlorhexidine Gluconate Cloth  6 each Topical Q0600  . cinacalcet  30 mg Oral Q T,Th,Sa-HD  . doxercalciferol  6 mcg Intravenous Q T,Th,Sa-HD  . fluticasone  1 spray Each Nare Daily  . Gerhardt's butt cream   Topical QID  . hydrocerin   Topical BID  . hydrocortisone  25 mg Rectal BID  . loratadine  10 mg Oral Daily  .  oxymetazoline  1 spray Each Nare BID  . pantoprazole  40 mg Oral Daily  . pentoxifylline  400 mg Oral Daily  . Selexipag  800 mcg Oral BID  . senna-docusate  1 tablet Oral BID  . sodium chloride  1 spray Each Nare 5 X Daily  . tadalafil  20 mg Oral Daily   Continuous: . sodium chloride      Assessment/Plan: Severe epistaxis yesterday. - Will leave right packing in place until Monday. - Continue with Keflex while packing is in place. - Will observe.   LOS: 3 days   Katie Nunez W Baneza Bartoszek 12/21/2019, 10:10 AM

## 2019-12-21 NOTE — Progress Notes (Signed)
[facility] PHYSICAL MEDICINE & REHABILITATION PROGRESS NOTE   Subjective/Complaints: Pt up with PT in [place] this morning. Left rhino rocket came out in HD yesterday as apparently a large clot was adhered to back of it and extending into pharynx. No further bleeding fortunately. Feels well this morning except for spasms in right leg primarily.   ROS: Patient denies fever, rash, sore throat, blurred vision, nausea, vomiting, diarrhea, cough, shortness of breath or chest pain, joint or back pain, headache, or mood change.   Objective:   VAS [place] LOWER EXTREMITY VENOUS (DVT)  Result Date: 12/19/2019  Lower Venous DVT Study Performing Technologist: Vonzell Schlatter RVT  Examination Guidelines: A complete evaluation includes B-mode imaging, spectral Doppler, color Doppler, and power Doppler as needed of all accessible portions of each vessel. Bilateral testing is considered an integral part of a complete examination. Limited examinations for reoccurring indications may be performed as noted. The reflux portion of the exam is performed with the patient in reverse Trendelenburg.  +---------+---------------+---------+-----------+----------+--------------+ RIGHT    CompressibilityPhasicitySpontaneityPropertiesThrombus Aging +---------+---------------+---------+-----------+----------+--------------+ CFV      Full           Yes      Yes                                 +---------+---------------+---------+-----------+----------+--------------+ SFJ      Full                                                        +---------+---------------+---------+-----------+----------+--------------+ FV Prox  Full                                                        +---------+---------------+---------+-----------+----------+--------------+ FV Mid   Full                                                        +---------+---------------+---------+-----------+----------+--------------+ FV DistalFull                                                         +---------+---------------+---------+-----------+----------+--------------+ PFV      Full                                                        +---------+---------------+---------+-----------+----------+--------------+ POP      Full           Yes      Yes                                 +---------+---------------+---------+-----------+----------+--------------+  PTV      Full                                                        +---------+---------------+---------+-----------+----------+--------------+ PERO     Full                                                        +---------+---------------+---------+-----------+----------+--------------+   +---------+---------------+---------+-----------+----------+--------------+ LEFT     CompressibilityPhasicitySpontaneityPropertiesThrombus Aging +---------+---------------+---------+-----------+----------+--------------+ CFV      Full           Yes      Yes                                 +---------+---------------+---------+-----------+----------+--------------+ SFJ      Full                                                        +---------+---------------+---------+-----------+----------+--------------+ FV Prox  Full                                                        +---------+---------------+---------+-----------+----------+--------------+ FV Mid   Full                                                        +---------+---------------+---------+-----------+----------+--------------+ FV DistalFull                                                        +---------+---------------+---------+-----------+----------+--------------+ PFV      Full                                                        +---------+---------------+---------+-----------+----------+--------------+ POP      Full           Yes      Yes                                  +---------+---------------+---------+-----------+----------+--------------+ PTV      Full                                                        +---------+---------------+---------+-----------+----------+--------------+  PERO     Full                                                        +---------+---------------+---------+-----------+----------+--------------+     Summary: RIGHT: - There is no evidence of deep vein thrombosis in the lower extremity.  - No cystic structure found in the popliteal fossa.  LEFT: - There is no evidence of deep vein thrombosis in the lower extremity.  - No cystic structure found in the popliteal fossa.  *See table(s) above for measurements and observations. Electronically signed by Servando Snare MD on 12/19/2019 at 4:50:49 PM.    Final    Recent Labs    12/19/19 0442 12/20/19 1038  WBC 2.9* 3.1*  HGB 10.7* 11.1*  HCT 33.9* 33.4*  PLT 101* 161   Recent Labs    12/20/19 1358  NA 134*  K 4.1  CL 96*  CO2 24  GLUCOSE 100*  BUN 56*  CREATININE 6.81*  CALCIUM 9.0    Intake/Output Summary (Last 24 hours) at 12/21/2019 0914 Last data filed at 12/21/2019 0830 Gross per 24 hour  Intake 560 ml  Output 1000 ml  Net -440 ml     Pressure Injury 12/18/19 Sacrum Medial Stage 1 -  Intact skin with non-blanchable redness of a localized area usually over a bony prominence. Red, non-blanchable spot over sacrum area (Active)  12/18/19 1707  Location: Sacrum  Location Orientation: Medial  Staging: Stage 1 -  Intact skin with non-blanchable redness of a localized area usually over a bony prominence.  Wound Description (Comments): Red, non-blanchable spot over sacrum area  Present on Admission: Yes    Physical Exam:  Constitutional: No distress . Vital signs reviewed. HEENT: right rhino rocket in place, no visible blood. Left nare pink/clear without discharge Neck: supple Cardiovascular: RRR without murmur. No JVD    Respiratory/Chest: CTA  Bilaterally without wheezes or rales. Normal effort    GI/Abdomen: BS +, non-tender, non-distended Ext: no clubbing, cyanosis, or edema Psych: pleasant and cooperative Skin: Clean and intact without signs of breakdown, LUE fistula intact. Left calf wound packed. ?mild sacral redness Neuro: Pt is cognitively appropriate with normal insight, memory, and awareness. Cranial nerves 2-12 are intact. Seems to sense pain and LT in all 4's. Reflexes are 2+ on left. 3+ right. Fine motor coordination is intact. No tremors. Motor function is grossly 5/5 LUE and LLE.  RUE and RLE remain 0/5 .  Musculoskeletal: visible spasms in right quad     Vital Signs Blood pressure 119/66, pulse 90, temperature 98.3 F (36.8 C), temperature source Oral, resp. rate 16, height 5\' 5"  (1.651 m), weight 58.9 kg, last menstrual period 11/15/2008, SpO2 90 %.    Assessment/Plan: 1. Functional deficits which require 3+ hours per day of interdisciplinary therapy in a comprehensive inpatient rehab setting.  Physiatrist is providing close team supervision and 24 hour management of active medical problems listed below.  Physiatrist and rehab team continue to assess barriers to discharge/monitor patient progress toward functional and medical goals  Care Tool:  Bathing    Body parts bathed by patient: Chest,Abdomen,Right upper leg,Left upper leg,Right arm,Face   Body parts bathed by helper: Left arm,Front perineal area,Buttocks,Right lower leg,Left lower leg     Bathing assist Assist Level: Moderate Assistance - Patient 50 - 74%  Upper Body Dressing/Undressing Upper body dressing   What is the patient wearing?: Pull over shirt    Upper body assist Assist Level: Maximal Assistance - Patient 25 - 49%    Lower Body Dressing/Undressing Lower body dressing      What is the patient wearing?: Underwear/pull up,Pants     Lower body assist Assist for lower body dressing: Total Assistance - Patient < 25%      Toileting Toileting    Toileting assist Assist for toileting: Total Assistance - Patient < 25%     Transfers Chair/bed transfer  Transfers assist     Chair/bed transfer assist level: Moderate Assistance - Patient 50 - 74%     Locomotion Ambulation   Ambulation assist      Assist level: 2 helpers Assistive device: Hand held assist Max distance: 30'   Walk 10 feet activity   Assist     Assist level: 2 helpers Assistive device: Hand held assist   Walk 50 feet activity   Assist Walk 50 feet with 2 turns activity did not occur: Safety/medical concerns         Walk 150 feet activity   Assist Walk 150 feet activity did not occur: Safety/medical concerns         Walk 10 feet on uneven surface  activity   Assist Walk 10 feet on uneven surfaces activity did not occur: Safety/medical concerns         Wheelchair     Assist Will patient use wheelchair at discharge?: Yes Type of Wheelchair: Manual Wheelchair activity did not occur: Safety/medical concerns         Wheelchair 50 feet with 2 turns activity    Assist    Wheelchair 50 feet with 2 turns activity did not occur: Safety/medical concerns       Wheelchair 150 feet activity     Assist  Wheelchair 150 feet activity did not occur: Safety/medical concerns       Blood pressure 119/66, pulse 90, temperature 98.3 F (36.8 C), temperature source Oral, resp. rate 16, height 5\' 5"  (1.651 m), weight 58.9 kg, last menstrual period 11/15/2008, SpO2 90 %.  Medical Problem List and Plan: 1.  L frontal ICH secondary to recent starting of Coumadin- Coumadin on hold with R hemiplegia             -patient may  shower             -ELOS/Goals: Supervision to mod I in 2-3 weeks  --Continue CIR therapies including PT, OT, and SLP.   2.  Portal vein thrombosis/A fib/Antithrombotics: -DVT/anticoagulation:  Pharmaceutical: SCDs due to thrombocytopenia and recent bleed.  --per neuro no  coumadin-->will need to wait at least one month due to size of bleed.  --dopplers ok             -antiplatelet therapy: N/A 3. Pain Management: tylenol prn.   - kpad for neck pain  -pt to work with pt today, might benefit from massage, K-tape  -will try baclofen in place of robaxin for RLE spasms 4. Mood: LCSW to follow up for evaluation and support.              -antipsychotic agents: N/A 5. Neuropsych: This patient is capable of making decisions on her own behalf. 6. Wound ulcer/Skin/Wound Care:Pack LLE wound with iodoform dressing daily.    -turning, nutrition, education 7. Fluids/Electrolytes/Nutrition: Strict I/O. Labs with HD on TTS.  8. ESRD: Continue HD on TTS at the end  of the day to help with therapy tolerance.  9. HTN: Monitor BP tid-currently controlled on current regimen 10. Pulmonary HTN: Oxygen dependent. Continue Ambrisentan and Selexipag. On Home dose 2L by [place] 11. Cirrhosis of the liver: Monitor platelets and for any signs of bleeding. Reporting abdominal distension.  (protal vein thrombus)  -pt asked about scheduled CT scan of liver prior to [place]. Will likely wait until after discharge for CT unless there is a specific reason otherwise to order it. 12. Chronic systolic CHF: Monitor daily weights as well as I/O. Continue tadalafil.    Filed Weights   12/20/19 0259 12/21/19 0413  Weight: 55.2 kg 58.9 kg    -weight up AFTER HD? 13. CAF: Monitor HR tid--metoprolol has been on hold. Coumadin to be held due to [place].  14. Pancytopenia: WBC 3.3-->2.8,  Platelets 130-->105, hgb 11.5-->10.4. Continue to monitor with serial checks.   -12/8 HGB/PLT stable today 10.7/101  12/9 hgb   15. Constipation WITH hemorrhoids- suggest Sorbitol- suggested to PA?- also might need Anusol.  -loose stools this week  -hold am senna-s, resumed   -prn miralax only  12/10 stools more formed this morning 16. Significant epistaxis (pt has prior history)  -rhino rockets applied. Left one is now out.    -appreciate ENT assessment and recs  -will continue right rhino rocket, left side looks ok this morning.   - spoke with Dr. Benjamine Mola, he will come by to see if we should replace the left sided RR  -  CBC, PTT, PT/INR all stable  -scheduled afrin and ocean nasal spray    LOS: 3 days A FACE TO FACE EVALUATION WAS PERFORMED  Meredith Staggers 12/21/2019, 9:14 AM

## 2019-12-22 LAB — CBC
HCT: 29.3 % — ABNORMAL LOW (ref 36.0–46.0)
Hemoglobin: 9.9 g/dL — ABNORMAL LOW (ref 12.0–15.0)
MCH: 30.1 pg (ref 26.0–34.0)
MCHC: 33.8 g/dL (ref 30.0–36.0)
MCV: 89.1 fL (ref 80.0–100.0)
Platelets: 130 10*3/uL — ABNORMAL LOW (ref 150–400)
RBC: 3.29 MIL/uL — ABNORMAL LOW (ref 3.87–5.11)
RDW: 16.4 % — ABNORMAL HIGH (ref 11.5–15.5)
WBC: 3.3 10*3/uL — ABNORMAL LOW (ref 4.0–10.5)
nRBC: 0 % (ref 0.0–0.2)

## 2019-12-22 LAB — RENAL FUNCTION PANEL
Albumin: 2.7 g/dL — ABNORMAL LOW (ref 3.5–5.0)
Anion gap: 10 (ref 5–15)
BUN: 47 mg/dL — ABNORMAL HIGH (ref 8–23)
CO2: 26 mmol/L (ref 22–32)
Calcium: 9.6 mg/dL (ref 8.9–10.3)
Chloride: 97 mmol/L — ABNORMAL LOW (ref 98–111)
Creatinine, Ser: 6.15 mg/dL — ABNORMAL HIGH (ref 0.44–1.00)
GFR, Estimated: 7 mL/min — ABNORMAL LOW (ref >60)
Glucose, Bld: 100 mg/dL — ABNORMAL HIGH (ref 70–99)
Phosphorus: 4.9 mg/dL — ABNORMAL HIGH (ref 2.5–4.6)
Potassium: 4.2 mmol/L (ref 3.5–5.1)
Sodium: 133 mmol/L — ABNORMAL LOW (ref 135–145)

## 2019-12-22 MED ORDER — TRAZODONE HCL 50 MG PO TABS: 50.0000 mg | ORAL_TABLET | Freq: Every evening | ORAL | Status: DC | PRN

## 2019-12-22 MED ORDER — SODIUM CHLORIDE 0.9 % IV SOLN: 100.0000 mL | INTRAVENOUS | Status: DC | PRN

## 2019-12-22 MED ORDER — PENTAFLUOROPROP-TETRAFLUOROETH EX AERO: 1.0000 "application " | INHALATION_SPRAY | CUTANEOUS | Status: DC | PRN

## 2019-12-22 MED ORDER — ALTEPLASE 2 MG IJ SOLR: 2.0000 mg | Freq: Once | INTRAMUSCULAR | Status: DC | PRN

## 2019-12-22 MED ORDER — CINACALCET HCL 30 MG PO TABS
ORAL_TABLET | ORAL | Status: AC
  Administered 2019-12-22: 30 mg via ORAL
  Filled 2019-12-22: qty 1

## 2019-12-22 MED ORDER — LIDOCAINE HCL (PF) 1 % IJ SOLN: 5.0000 mL | INTRAMUSCULAR | Status: DC | PRN

## 2019-12-22 MED ORDER — TRAZODONE HCL 50 MG PO TABS
25.0000 mg | ORAL_TABLET | Freq: Every evening | ORAL | Status: DC | PRN
  Filled 2019-12-22 (×2): qty 1

## 2019-12-22 MED ORDER — LIDOCAINE-PRILOCAINE 2.5-2.5 % EX CREA: 1.0000 "application " | TOPICAL_CREAM | CUTANEOUS | Status: DC | PRN

## 2019-12-22 MED ORDER — DOXERCALCIFEROL 4 MCG/2ML IV SOLN
INTRAVENOUS | Status: AC
  Administered 2019-12-22: 6 ug via INTRAVENOUS
  Filled 2019-12-22: qty 4

## 2019-12-22 MED ORDER — TRAMADOL HCL 50 MG PO TABS
25.0000 mg | ORAL_TABLET | Freq: Two times a day (BID) | ORAL | Status: DC | PRN
  Administered 2019-12-22 – 2019-12-26 (×5): 25 mg via ORAL
  Filled 2019-12-22 (×6): qty 1

## 2019-12-22 MED ORDER — PHENYLEPHRINE HCL 10 MG PO TABS: 10.0000 mg | ORAL_TABLET | ORAL | Status: DC | PRN

## 2019-12-22 MED ORDER — PSEUDOEPHEDRINE HCL 30 MG PO TABS
30.0000 mg | ORAL_TABLET | ORAL | Status: DC | PRN
  Administered 2019-12-22 – 2020-01-18 (×11): 30 mg via ORAL
  Filled 2019-12-22 (×18): qty 1

## 2019-12-22 MED ORDER — HEPARIN SODIUM (PORCINE) 1000 UNIT/ML DIALYSIS: 1000.0000 [IU] | INTRAMUSCULAR | Status: DC | PRN

## 2019-12-22 NOTE — Progress Notes (Signed)
[facility] PHYSICAL MEDICINE & REHABILITATION PROGRESS NOTE   Subjective/Complaints: Appreciate nursing communication overnight. 25mg  trazodone was not enough to help patient sleep, but she prefers no increase in medication. Patient had soft medium sized BM last night without enema.  SPO2 listed as 86%- discussed with nursing and this was error-actually 96%.   ROS: Patient denies fever, rash, sore throat, blurred vision, nausea, vomiting, diarrhea, cough, shortness of breath or chest pain, joint or back pain, headache, or mood change.   Objective:   No results found. Recent Labs    12/20/19 1038  WBC 3.1*  HGB 11.1*  HCT 33.4*  PLT 161   Recent Labs    12/20/19 1358  NA 134*  K 4.1  CL 96*  CO2 24  GLUCOSE 100*  BUN 56*  CREATININE 6.81*  CALCIUM 9.0    Intake/Output Summary (Last 24 hours) at 12/22/2019 4315 Last data filed at 12/21/2019 0830 Gross per 24 hour  Intake 200 ml  Output --  Net 200 ml     Pressure Injury 12/18/19 Sacrum Medial Stage 1 -  Intact skin with non-blanchable redness of a localized area usually over a bony prominence. Red, non-blanchable spot over sacrum area (Active)  12/18/19 1707  Location: Sacrum  Location Orientation: Medial  Staging: Stage 1 -  Intact skin with non-blanchable redness of a localized area usually over a bony prominence.  Wound Description (Comments): Red, non-blanchable spot over sacrum area  Present on Admission: Yes    Physical Exam: Gen: no distress, normal appearing HEENT: oral mucosa pink and moist, NCAT Cardio: Reg rate Chest: normal effort, normal rate of breathing Abd: soft, non-distended Ext: no edema Psych: pleasant and cooperative Skin: Clean and intact without signs of breakdown, LUE fistula intact. Left calf wound packed. ?mild sacral redness Neuro: Pt is cognitively appropriate with normal insight, memory, and awareness. Cranial nerves 2-12 are intact. Seems to sense pain and LT in all 4's.  Reflexes are 2+ on left. 3+ right. Fine motor coordination is intact. No tremors. Motor function is grossly 5/5 LUE and LLE.  RUE and RLE remain 0/5 .  Musculoskeletal: visible spasms in right quad   Vital Signs Blood pressure 115/67, pulse 99, temperature 98 F (36.7 C), temperature source Oral, resp. rate 16, height 5\' 5"  (1.651 m), weight 58.9 kg, last menstrual period 11/15/2008, SpO2 (!) 86 %.  Assessment/Plan: 1. Functional deficits which require 3+ hours per day of interdisciplinary therapy in a comprehensive inpatient rehab setting.  Physiatrist is providing close team supervision and 24 hour management of active medical problems listed below.  Physiatrist and rehab team continue to assess barriers to discharge/monitor patient progress toward functional and medical goals  Care Tool:  Bathing    Body parts bathed by patient: Chest,Abdomen,Right upper leg,Left upper leg,Right arm,Face   Body parts bathed by helper: Left arm,Front perineal area,Buttocks,Right lower leg,Left lower leg     Bathing assist Assist Level: Moderate Assistance - Patient 50 - 74%     Upper Body Dressing/Undressing Upper body dressing   What is the patient wearing?: Pull over shirt    Upper body assist Assist Level: Maximal Assistance - Patient 25 - 49%    Lower Body Dressing/Undressing Lower body dressing      What is the patient wearing?: Underwear/pull up,Pants     Lower body assist Assist for lower body dressing: Total Assistance - Patient < 25%     Toileting Toileting    Toileting assist Assist for toileting: Total  Assistance - Patient < 25%     Transfers Chair/bed transfer  Transfers assist     Chair/bed transfer assist level: Moderate Assistance - Patient 50 - 74%     Locomotion Ambulation   Ambulation assist      Assist level: 2 helpers Assistive device: Hand held assist Max distance: 30'   Walk 10 feet activity   Assist     Assist level: 2  helpers Assistive device: Hand held assist   Walk 50 feet activity   Assist Walk 50 feet with 2 turns activity did not occur: Safety/medical concerns         Walk 150 feet activity   Assist Walk 150 feet activity did not occur: Safety/medical concerns         Walk 10 feet on uneven surface  activity   Assist Walk 10 feet on uneven surfaces activity did not occur: Safety/medical concerns         Wheelchair     Assist Will patient use wheelchair at discharge?: Yes Type of Wheelchair: Manual Wheelchair activity did not occur: Safety/medical concerns         Wheelchair 50 feet with 2 turns activity    Assist    Wheelchair 50 feet with 2 turns activity did not occur: Safety/medical concerns       Wheelchair 150 feet activity     Assist  Wheelchair 150 feet activity did not occur: Safety/medical concerns       Blood pressure 115/67, pulse 99, temperature 98 F (36.7 C), temperature source Oral, resp. rate 16, height 5\' 5"  (1.651 m), weight 58.9 kg, last menstrual period 11/15/2008, SpO2 (!) 86 %.  Medical Problem List and Plan: 1.  L frontal ICH secondary to recent starting of Coumadin- Coumadin on hold with R hemiplegia             -patient may  shower             -ELOS/Goals: Supervision to mod I in 2-3 weeks  --Continue CIR therapies including PT, OT, and SLP.   2.  Portal vein thrombosis/A fib/Antithrombotics: -DVT/anticoagulation:  Pharmaceutical: SCDs due to thrombocytopenia and recent bleed.  --per neuro no coumadin-->will need to wait at least one month due to size of bleed.  --dopplers ok             -antiplatelet therapy: N/A 3. Pain Management: tylenol prn.   - kpad for neck pain  -pt to work with pt today, might benefit from massage, K-tape  -will try baclofen in place of robaxin for RLE spasms 4. Mood: LCSW to follow up for evaluation and support.              -antipsychotic agents: N/A 5. Neuropsych: This patient is capable  of making decisions on her own behalf. 6. Wound ulcer/Skin/Wound Care:Pack LLE wound with iodoform dressing daily.    -turning, nutrition, education 7. Fluids/Electrolytes/Nutrition: Strict I/O. Labs with HD on TTS.  8. ESRD: Continue HD on TTS at the end of the day to help with therapy tolerance.  9. HTN: Monitor BP tid-currently controlled on current regimen 10. Pulmonary HTN: Oxygen dependent. Continue Ambrisentan and Selexipag. On Home dose 2L by [place] 11. Cirrhosis of the liver: Monitor platelets and for any signs of bleeding. Reporting abdominal distension.  (protal vein thrombus)  -pt asked about scheduled CT scan of liver prior to [place]. Will likely wait until after discharge for CT unless there is a specific reason otherwise to order it. 12.  Chronic systolic CHF: Monitor daily weights as well as I/O. Continue tadalafil.    Filed Weights   12/20/19 0259 12/21/19 0413  Weight: 55.2 kg 58.9 kg    -weight increased 12/10: monitor after she has HD today.  13. CAF: Monitor HR tid--metoprolol has been on hold. Coumadin to be held due to [place].  14. Pancytopenia: WBC 3.3-->2.8,  Platelets 130-->105, hgb 11.5-->10.4. Continue to monitor with serial checks.   -12/8 HGB/PLT stable today 10.7/101  12/9 hgb is 11.1, repeat on Monday.  15. Constipation WITH hemorrhoids- suggest Sorbitol- suggested to PA?- also might need Anusol.  -loose stools this week  -hold am senna-s, resumed   -prn miralax only  12/11: had medium formed BM last night: can hold off on enema if having regular BM.  16. Significant epistaxis (pt has prior history)  -rhino rockets applied. Left one is now out.   -appreciate ENT assessment and recs  -will continue right rhino rocket, left side looks ok this morning.   - spoke with Dr. Benjamine Mola, he will come by to see if we should replace the left sided RR  -  CBC, PTT, PT/INR all stable  -scheduled afrin and ocean nasal spray 17. Insomnia:discussed increasing dose of trazodone but  patient prefers not to at this time. Discussed importance of sleep for health, function, participation in therapy.     LOS: 4 days A FACE TO FACE EVALUATION WAS PERFORMED  Clide Deutscher Kylle Lall 12/22/2019, 6:49 AM

## 2019-12-22 NOTE — Progress Notes (Signed)
[facility] NEPHROLOGY PROGRESS NOTE  Assessment/ Plan: Pt is a 64 y.o. yo female with ESRD on HD presented with right hemiplegia secondary to  [place].  HD orders: [place] T, Th,S 3.5 hrs 450/600 2.0 K/2.25 Ca 58.5 kg UFP 2 linear Na  L AVF -No Heparin -Mircera 50 mcg IV q 4 weeks -Hectorol 6 mcg IV TIW -Sensipar 30 mg PO TIW  # ICH-thought secondary to fairly new Coumadin therapy. Coumadin was held, reversing agents given, neurology managing. Repeat CT of head 12/02 without evidence of extension of hematoma. Now in CIR.  #ESRD:We will continue with TTS schedule.  Plan for regular dialysis today.  #Hypertension/Volume- No evidence of volume overload. UF as tol during HD.   Monitor blood pressure.  #Anemia of ESRD:Hgb at goal. No ESA needed. Some blood loss from epistaxis, avoiding anticoagulation.  #Secondary hyperparathyroidism: Phosphorus level improved with higher dose of PhosLo.  Continue Sensipar and Hectorol.  #pulmonary HTN. Follows with cardiology at [facility]. Currently on tadalafil and Uptravi for her pulmonary hypertension.Marland Kitchen Avoid hypotension.   #Epistaxis: Seen by ENT, has nasal packing.  On Keflex  Subjective: Seen and examined.  No new event.  Denies nausea vomiting chest pain shortness of breath.  Dialysis today. Objective Vital signs in last 24 hours: Vitals:   12/21/19 0413 12/21/19 1605 12/21/19 1944 12/22/19 0420  BP: 119/66 126/64 120/68 115/67  Pulse: 90 79 85 99  Resp: 16 17 17 16   Temp: 98.3 F (36.8 C) 98 F (36.7 C) 98.8 F (37.1 C) 98 F (36.7 C)  TempSrc: Oral   Oral  SpO2: 90% 93% 100% 96%  Weight: 58.9 kg     Height:       Weight change:  No intake or output data in the 24 hours ending 12/22/19 1021     Labs: Basic Metabolic Panel: Recent Labs  Lab 12/16/19 1717 12/18/19 0635 12/20/19 1358  NA 134* 136 134*  K 3.4* 3.6 4.1  CL 93* 96* 96*  CO2 28 26 24   GLUCOSE 178* 90 100*  BUN 26* 46* 56*  CREATININE 4.78* 7.27*  6.81*  CALCIUM 8.5* 8.7* 9.0  PHOS  --  6.7* 5.2*   Liver Function Tests: Recent Labs  Lab 12/18/19 0635 12/20/19 1358  ALBUMIN 2.6* 2.6*   No results for input(s): LIPASE, AMYLASE in the last 168 hours. No results for input(s): AMMONIA in the last 168 hours. CBC: Recent Labs  Lab 12/18/19 0635 12/19/19 0442 12/20/19 1038  WBC 2.8* 2.9* 3.1*  NEUTROABS  --  1.7  --   HGB 10.4* 10.7* 11.1*  HCT 32.5* 33.9* 33.4*  MCV 92.1 92.9 90.8  PLT 105* 101* 161   Cardiac Enzymes: No results for input(s): CKTOTAL, CKMB, CKMBINDEX, TROPONINI in the last 168 hours. CBG: Recent Labs  Lab 12/16/19 1737 12/16/19 1740 12/16/19 2200 12/17/19 0617 12/17/19 1144  GLUCAP 11* 119* 87 81 109*    Iron Studies: No results for input(s): IRON, TIBC, TRANSFERRIN, FERRITIN in the last 72 hours. Studies/Results: No results found.  Medications: Infusions: . sodium chloride      Scheduled Medications: . ambrisentan  5 mg Oral Daily  . calcium acetate  1,334 mg Oral TID WC  . camphor-menthol   Topical BID  . cephALEXin  500 mg Oral Q12H  . Chlorhexidine Gluconate Cloth  6 each Topical Q0600  . Chlorhexidine Gluconate Cloth  6 each Topical Q0600  . cinacalcet  30 mg Oral Q T,Th,Sa-HD  . doxercalciferol  6  mcg Intravenous Q T,Th,Sa-HD  . fluticasone  1 spray Each Nare Daily  . Gerhardt's butt cream   Topical QID  . hydrocerin   Topical BID  . hydrocortisone  25 mg Rectal BID  . loratadine  10 mg Oral Daily  . lubiprostone  8 mcg Oral BID WC  . oxymetazoline  1 spray Each Nare BID  . pantoprazole  40 mg Oral Daily  . pentoxifylline  400 mg Oral Daily  . Selexipag  800 mcg Oral BID  . senna-docusate  1 tablet Oral BID  . sodium chloride  1 spray Each Nare 5 X Daily  . tadalafil  20 mg Oral Daily    have reviewed scheduled and prn medications.  Physical Exam: General: NAD, has nasal packing Heart:RRR, s1s2 nl Lungs: Clear b/l, no crackle Abdomen:soft, Non-tender,  non-distended Extremities:No edema Dialysis Access: Left upper extremity AV fistula has good thrill.  Katie Nunez Katie Nunez Katie Nunez 12/22/2019,10:21 AM  LOS: 4 days  Pager: [phone]

## 2019-12-22 NOTE — Progress Notes (Signed)
   12/22/19 1740  Vitals  Temp 98.9 F (37.2 C)  BP 112/60  MAP (mmHg) 76  BP Location Right Arm  BP Method Automatic  Patient Position (if appropriate) Lying  Pulse Rate 91  Pulse Rate Source Monitor  Resp 18  Oxygen Therapy  SpO2 100 %  O2 Device Nasal Cannula  O2 Flow Rate (L/min) 2 L/min  Dialysis Weight  Weight 54.2 kg  Type of Weight Post-Dialysis

## 2019-12-23 ENCOUNTER — Inpatient Hospital Stay (HOSPITAL_COMMUNITY): Payer: Medicare Other | Admitting: Physical Therapy

## 2019-12-23 ENCOUNTER — Inpatient Hospital Stay (HOSPITAL_COMMUNITY): Payer: Medicare Other

## 2019-12-23 ENCOUNTER — Inpatient Hospital Stay (HOSPITAL_COMMUNITY): Payer: Medicare Other | Admitting: Speech Pathology

## 2019-12-23 MED ORDER — SENNOSIDES-DOCUSATE SODIUM 8.6-50 MG PO TABS: 1.0000 | ORAL_TABLET | Freq: Two times a day (BID) | ORAL | Status: DC | PRN

## 2019-12-23 NOTE — Progress Notes (Addendum)
Speech Language Pathology Daily Session Note  Patient Details  Name: Katie Nunez MRN: [id number] Date of Birth: 1955/02/06  Today's Date: 12/23/2019 SLP Individual Time: 9923-4144 SLP Individual Time Calculation (min): 43 min  Short Term Goals: Week 1: SLP Short Term Goal 1 (Week 1): Patient will demonstrate complex problem solving for functional and familiar tasks with supervision verbal cues. SLP Short Term Goal 2 (Week 1): Patient will recall new, daily information with Min verbal cues for use of memory compensatory strategies. SLP Short Term Goal 3 (Week 1): Patient will demonstrate selective attention to tasks in a mildly distracting enviornment for 45 minutes with Min verbal cues for redirection.  Skilled Therapeutic Interventions:  Pt was seen for skilled ST targeting cognitive goals.  During functional conversations with therapist, pt was able to recall specific details from previous [place] appointments with supervision question cues.  Pt was also able to selectively attend to conversations in the presence of multiple environmental distractions for the duration of today's therapy session, although she did require min verbal cues for topic maintenance.  Pt reports she feels that her mentation is at baseline but has more concern about regaining the use of her affected upper extremity.  Pt was left in wheelchair with chair alarm set and call bell within reach.  Continue per current plan of care.     Pain Pain Assessment Pain Scale: 0-10 Pain Score: 0-No pain  Therapy/Group: Individual Therapy  Fines Kimberlin, Selinda Orion 12/23/2019, 12:36 PM

## 2019-12-23 NOTE — Progress Notes (Signed)
[facility] NEPHROLOGY PROGRESS NOTE  Assessment/ Plan: Pt is a 64 y.o. yo female with ESRD on HD presented with right hemiplegia secondary to  [place].  HD orders: [place] T, Th,S 3.5 hrs 450/600 2.0 K/2.25 Ca 58.5 kg UFP 2 linear Na  L AVF -No Heparin -Mircera 50 mcg IV q 4 weeks -Hectorol 6 mcg IV TIW -Sensipar 30 mg PO TIW  # ICH-thought secondary to fairly new Coumadin therapy. Coumadin was held, reversing agents given, neurology managing. Repeat CT of head 12/02 without evidence of extension of hematoma. Now in CIR.  #ESRD:We will continue with TTS schedule.  Status post HD yesterday with 1.5 L UF, tolerated well.  Plan for next HD on 12/14.    #Hypertension/Volume- No evidence of volume overload. UF as tol during HD.   Monitor blood pressure.  #Anemia of ESRD:Hgb at goal. No ESA needed. Slight drop in hemoglobin probably because of blood loss from epistaxis, avoiding anticoagulation.  #Secondary hyperparathyroidism: Phosphorus level improved with higher dose of PhosLo.  Continue Sensipar and Hectorol.  #pulmonary HTN. Follows with [facility]ology at [facility]. Currently on tadalafil and Uptravi for her pulmonary hypertension.Marland Kitchen Avoid hypotension.   #Epistaxis: Seen by ENT, has nasal packing.  On Keflex  Subjective: Seen and examined.  Doing inpatient rehab.  Denies nausea vomiting chest pain shortness of breath.   Objective Vital signs in last 24 hours: Vitals:   12/22/19 1738 12/22/19 1740 12/22/19 2016 12/23/19 0445  BP: 104/61 112/60 128/66 121/65  Pulse: 95 91 95 93  Resp:  18 18 16   Temp:  98.9 F (37.2 C) 99.5 F (37.5 C) 98.8 F (37.1 C)  TempSrc:      SpO2:  100% 98% 98%  Weight:  54.2 kg  55.3 kg  Height:       Weight change:   Intake/Output Summary (Last 24 hours) at 12/23/2019 0938 Last data filed at 12/22/2019 2038 Gross per 24 hour  Intake 200 ml  Output 1500 ml  Net -1300 ml       Labs: Basic Metabolic Panel: Recent Labs  Lab  12/18/19 0635 12/20/19 1358 12/22/19 1325  NA 136 134* 133*  K 3.6 4.1 4.2  CL 96* 96* 97*  CO2 26 24 26   GLUCOSE 90 100* 100*  BUN 46* 56* 47*  CREATININE 7.27* 6.81* 6.15*  CALCIUM 8.7* 9.0 9.6  PHOS 6.7* 5.2* 4.9*   Liver Function Tests: Recent Labs  Lab 12/18/19 0635 12/20/19 1358 12/22/19 1325  ALBUMIN 2.6* 2.6* 2.7*   No results for input(s): LIPASE, AMYLASE in the last 168 hours. No results for input(s): AMMONIA in the last 168 hours. CBC: Recent Labs  Lab 12/18/19 0635 12/19/19 0442 12/20/19 1038 12/22/19 1325  WBC 2.8* 2.9* 3.1* 3.3*  NEUTROABS  --  1.7  --   --   HGB 10.4* 10.7* 11.1* 9.9*  HCT 32.5* 33.9* 33.4* 29.3*  MCV 92.1 92.9 90.8 89.1  PLT 105* 101* 161 130*   Cardiac Enzymes: No results for input(s): CKTOTAL, CKMB, CKMBINDEX, TROPONINI in the last 168 hours. CBG: Recent Labs  Lab 12/16/19 1737 12/16/19 1740 12/16/19 2200 12/17/19 0617 12/17/19 1144  GLUCAP 11* 119* 87 81 109*    Iron Studies: No results for input(s): IRON, TIBC, TRANSFERRIN, FERRITIN in the last 72 hours. Studies/Results: No results found.  Medications: Infusions:   Scheduled Medications: . ambrisentan  5 mg Oral Daily  . calcium acetate  1,334 mg Oral TID WC  . camphor-menthol   Topical  BID  . cephALEXin  500 mg Oral Q12H  . Chlorhexidine Gluconate Cloth  6 each Topical Q0600  . Chlorhexidine Gluconate Cloth  6 each Topical Q0600  . cinacalcet  30 mg Oral Q T,Th,Sa-HD  . doxercalciferol  6 mcg Intravenous Q T,Th,Sa-HD  . fluticasone  1 spray Each Nare Daily  . Gerhardt's butt cream   Topical QID  . hydrocerin   Topical BID  . hydrocortisone  25 mg Rectal BID  . loratadine  10 mg Oral Daily  . lubiprostone  8 mcg Oral BID WC  . oxymetazoline  1 spray Each Nare BID  . pentoxifylline  400 mg Oral Daily  . Selexipag  800 mcg Oral BID  . sodium chloride  1 spray Each Nare 5 X Daily  . tadalafil  20 mg Oral Daily    have reviewed scheduled and prn  medications.  Physical Exam: General: NAD, has nasal packing Heart:RRR, s1s2 nl Lungs: Clear b/l, no crackle Abdomen:soft, Non-tender, non-distended Extremities:No edema Dialysis Access: Left upper extremity AV fistula has good thrill.  Katie Nunez Katie Nunez Katie Nunez 12/23/2019,9:38 AM  LOS: 5 days  Pager: [phone]

## 2019-12-23 NOTE — Progress Notes (Signed)
Occupational Therapy Session Note  Patient Details  Name: Katie Nunez MRN: [id number] Date of Birth: [birth year]  Today's Date: 12/23/2019 OT Individual Time: 1110-1204 OT Individual Time Calculation (min): 54 min   Session 2: OT Individual Time: 1355-1450 OT Individual Time Calculation (min): 55 min    Short Term Goals: Week 1:  OT Short Term Goal 1 (Week 1): Pt will complete a stand pivot to toilet with mod A of 1. OT Short Term Goal 2 (Week 1): Pt will stand at toilet with mod A of 1. OT Short Term Goal 3 (Week 1): Pt will don shirt with mod A. OT Short Term Goal 4 (Week 1): Pt will don pants over feet with mod A.  Skilled Therapeutic Interventions/Progress Updates:    Pt received in w/c with c/o mild R shoulder pain, likely from sublux (which pt was educated on), no request for intervention. Pt requesting to transfer to [facility]. Mod A stand pivot transfer toward the R side. Max A for clothing management. Pt voided BM and then stood for peri hygiene, max A for peri cleansing. Pt completed another stand pivot transfer to the L, with mod A. Pt was taken to the therapy gym via w/c. She completed seated level gravity eliminated scapular protraction and retraction with mod-max facilitation. Tactile/manual facilitation required at the scapula as well. Little to no protraction activation. Pt then completed horizontal abd/adduction with minimal activation in adduction and none abduction. Pt was given demo re self PROM exercises to complete. Pt was returned to her room and transferred to recliner. She was left sitting up with all needs met, RUE supported on pillow.   Session 2: Pt received in recliner with no c/o pain. Pt requesting to have hair washed for hygiene and psychosocial adjustment. Pt completed stand pivot transfer to the w/c with mod A. She was backed up to the sink and tray used to wash her hair with mod A overall. Pt used blow dryer with min A for bimanual use. Pt was taken to the  therapy gym via w/c. 1:1 NMES applied to supraspinatus and middle deltoid to help approximate shoulder joint to reduce sublux and reduce pain. Pt reported really enjoying this. Pt was guided through muscle activation actively and passively as nmes was on. Kinesiotape was then applied to pt's R shoulder to support sublux. Pt was returned to her room and to the recliner. She was left sitting up with all needs met, chair alarm set.   Ratio 1:3 Rate 35 pps Waveform- Asymmetric Ramp 1.0 Pulse 300 Intensity- 19 Duration -   10 min  Report of pain at the beginning of session 0/10 Report of pain at the end of session 0/10  No adverse reactions after treatment and is skin intact.   Therapy Documentation Precautions:  Precautions Precautions: Fall Precaution Comments: Rt hemiplegia - subluxation Restrictions Weight Bearing Restrictions: No  Therapy/Group: Individual Therapy  Curtis Sites 12/23/2019, 7:45 AM

## 2019-12-23 NOTE — Progress Notes (Signed)
Physical Therapy Session Note  Patient Details  Name: LYNSEY ANGE MRN: [id number] Date of Birth: 05-01-1955  Today's Date: 12/23/2019 PT Individual Time: 5825-1898 PT Individual Time Calculation (min): 48 min   Short Term Goals: Week 1:  PT Short Term Goal 1 (Week 1): Pt will complete bed mobility with minA PT Short Term Goal 2 (Week 1): Pt will perform bed to chair transfer with minA PT Short Term Goal 3 (Week 1): Pt will perform sit to stand with minA PT Short Term Goal 4 (Week 1): Pt will ambulate x30' with modA +1.  Skilled Therapeutic Interventions/Progress Updates:  Pt was seen bedside in the am. Pt rolled R/L with side rail to assist with donning pants. Pt transferred supine to edge of bed with side rail and min A with verbal cues. Pt transferred edge of bed to w/c with mod A and verbal cues. Pt transported to rehab gym. Pt transferred to stand in parallel bars with min A and verbal cues. Pt tolerated standing about 30 seconds, worked on weight shifting. Pt returned to room following treatment and left sitting up in w.c with chair alarm and call bell within reach.   Therapy Documentation Precautions:  Precautions Precautions: Fall Precaution Comments: Rt hemiplegia - subluxation Restrictions Weight Bearing Restrictions: No General:   Pain: Pt c/o R shoulder and LE soreness.   Therapy/Group: Individual Therapy  Dub Amis 12/23/2019, 12:06 PM

## 2019-12-23 NOTE — Progress Notes (Signed)
Patient c/o right facial swelling, right shoulder pain and right ear ache. Tramadol given with minimal relief. Robaxin and klonopin given. MD made aware. Received order for Kpad. Patient made comfortable in bed. Kpad applied to right shoulder and back. Patient slept better this shift.

## 2019-12-23 NOTE — Progress Notes (Addendum)
[place] PHYSICAL MEDICINE & REHABILITATION PROGRESS NOTE   Subjective/Complaints: Patient's [birth year] is [birth year]! She complained of right sided facial swelling overnight, as well as right shoulder pain and right ear ache. Tramadol, Robaxin, Klonopin, and kpad were administered and patient slept better afterward.    ROS: Patient denies fever, rash, sore throat, blurred vision, nausea, vomiting, diarrhea, cough, shortness of breath or chest pain, joint or back pain, headache, or mood change.   Objective:   No results found. Recent Labs    12/20/19 1038 12/22/19 1325  WBC 3.1* 3.3*  HGB 11.1* 9.9*  HCT 33.4* 29.3*  PLT 161 130*   Recent Labs    12/20/19 1358 12/22/19 1325  NA 134* 133*  K 4.1 4.2  CL 96* 97*  CO2 24 26  GLUCOSE 100* 100*  BUN 56* 47*  CREATININE 6.81* 6.15*  CALCIUM 9.0 9.6    Intake/Output Summary (Last 24 hours) at 12/23/2019 0820 Last data filed at 12/22/2019 2038 Gross per 24 hour  Intake 200 ml  Output 1500 ml  Net -1300 ml     Pressure Injury 12/18/19 Sacrum Medial Stage 1 -  Intact skin with non-blanchable redness of a localized area usually over a bony prominence. Red, non-blanchable spot over sacrum area (Active)  12/18/19 1707  Location: Sacrum  Location Orientation: Medial  Staging: Stage 1 -  Intact skin with non-blanchable redness of a localized area usually over a bony prominence.  Wound Description (Comments): Red, non-blanchable spot over sacrum area  Present on Admission: Yes    Physical Exam: Gen: no distress, normal appearing HEENT: oral mucosa pink and moist, palpable lymph node below right lower jaw, non-TTP Cardio: Reg rate Chest: normal effort, normal rate of breathing Abd: soft, non-distended Psych: pleasant and cooperative Skin: Clean and intact without signs of breakdown, LUE fistula intact. Left calf wound packed. ?mild sacral redness Neuro: Pt is cognitively appropriate with normal insight, memory, and awareness.  Cranial nerves 2-12 are intact. Seems to sense pain and LT in all 4's. Reflexes are 2+ on left. 3+ right. Fine motor coordination is intact. No tremors. Motor function is grossly 5/5 LUE and LLE.  RUE and RLE remain 0/5 .  Musculoskeletal: visible spasms in right quad   Vital Signs Blood pressure 121/65, pulse 93, temperature 98.8 F (37.1 C), resp. rate 16, height 5\' 5"  (1.651 m), weight 55.3 kg, last menstrual period 11/15/2008, SpO2 98 %.  Assessment/Plan: 1. Functional deficits which require 3+ hours per day of interdisciplinary therapy in a comprehensive inpatient rehab setting.  Physiatrist is providing close team supervision and 24 hour management of active medical problems listed below.  Physiatrist and rehab team continue to assess barriers to discharge/monitor patient progress toward functional and medical goals  Care Tool:  Bathing    Body parts bathed by patient: Chest,Abdomen,Right upper leg,Left upper leg,Right arm,Face   Body parts bathed by helper: Left arm,Front perineal area,Buttocks,Right lower leg,Left lower leg     Bathing assist Assist Level: Moderate Assistance - Patient 50 - 74%     Upper Body Dressing/Undressing Upper body dressing   What is the patient wearing?: Pull over shirt    Upper body assist Assist Level: Maximal Assistance - Patient 25 - 49%    Lower Body Dressing/Undressing Lower body dressing      What is the patient wearing?: Underwear/pull up,Pants     Lower body assist Assist for lower body dressing: Total Assistance - Patient < 25%     Toileting Toileting  Toileting assist Assist for toileting: Total Assistance - Patient < 25%     Transfers Chair/bed transfer  Transfers assist     Chair/bed transfer assist level: Moderate Assistance - Patient 50 - 74%     Locomotion Ambulation   Ambulation assist      Assist level: 2 helpers Assistive device: Hand held assist Max distance: 30'   Walk 10 feet  activity   Assist     Assist level: 2 helpers Assistive device: Hand held assist   Walk 50 feet activity   Assist Walk 50 feet with 2 turns activity did not occur: Safety/medical concerns         Walk 150 feet activity   Assist Walk 150 feet activity did not occur: Safety/medical concerns         Walk 10 feet on uneven surface  activity   Assist Walk 10 feet on uneven surfaces activity did not occur: Safety/medical concerns         Wheelchair     Assist Will patient use wheelchair at discharge?: Yes Type of Wheelchair: Manual Wheelchair activity did not occur: Safety/medical concerns         Wheelchair 50 feet with 2 turns activity    Assist    Wheelchair 50 feet with 2 turns activity did not occur: Safety/medical concerns       Wheelchair 150 feet activity     Assist  Wheelchair 150 feet activity did not occur: Safety/medical concerns       Blood pressure 121/65, pulse 93, temperature 98.8 F (37.1 C), resp. rate 16, height 5\' 5"  (1.651 m), weight 55.3 kg, last menstrual period 11/15/2008, SpO2 98 %.  Medical Problem List and Plan: 1.  L frontal ICH secondary to recent starting of Coumadin- Coumadin on hold with R hemiplegia             -patient may  shower             -ELOS/Goals: Supervision to mod I in 2-3 weeks  --Continue CIR therapies including PT, OT, and SLP.   2.  Portal vein thrombosis/A fib/Antithrombotics: -DVT/anticoagulation:  Pharmaceutical: SCDs due to thrombocytopenia and recent bleed.  --per neuro no coumadin-->will need to wait at least one month due to size of bleed.  --dopplers ok             -antiplatelet therapy: N/A 3. Pain Management: tylenol prn.   - kpad for neck pain  -pt to work with pt today, might benefit from massage, K-tape  -will try baclofen in place of robaxin for RLE spasms  12/12: complained of right sided shoulder pain and ear ache yesterday: Sudafed, Tramadol, Robaxin, Klonopin, and kpad  given with good effect. Symptoms have improved today.  4. Mood: LCSW to follow up for evaluation and support.              -antipsychotic agents: N/A 5. Neuropsych: This patient is capable of making decisions on her own behalf. 6. Wound ulcer/Skin/Wound Care:Pack LLE wound with iodoform dressing daily.    -turning, nutrition, education 7. Fluids/Electrolytes/Nutrition: Strict I/O.  12/12: yesterday afternoon's labs show hyponatremia (133), normal potassium- continue to monitor labs with HD on TTS 8. ESRD: Continue HD on TTS at the end of the day to help with therapy tolerance.  9. HTN: Monitor BP tid-currently controlled on current regimen 10. Pulmonary HTN: Oxygen dependent. Continue Ambrisentan and Selexipag. On Home dose 2L by [place] 11. Cirrhosis of the liver: Monitor platelets and for any  signs of bleeding. Reporting abdominal distension.  (protal vein thrombus)  -pt asked about scheduled CT scan of liver prior to [place]. Will likely wait until after discharge for CT unless there is a specific reason otherwise to order it. 12. Chronic systolic CHF: Monitor daily weights as well as I/O. Continue tadalafil.    Filed Weights   12/22/19 1400 12/22/19 1740 12/23/19 0445  Weight: 55.7 kg 54.2 kg 55.3 kg    -weight increased 12/12: continue to monitor 13. CAF: Monitor HR tid--metoprolol has been on hold. Coumadin to be held due to [place].  14. Pancytopenia: WBC 3.3-->2.8,  Platelets 130-->105, hgb 11.5-->10.4. Continue to monitor with serial checks.   -12/8 HGB/PLT stable today 10.7/101  12/9 hgb is 11.1, 9.9 on 12/11: repeat with HD. Discussed result with patient. Drop likely secondary to epistaxis. Right rhino rocket is in place- she would like removed on 12/13.   15. Constipation WITH hemorrhoids- suggest Sorbitol- suggested to PA?- also might need Anusol.  -loose stools this week  -hold am senna-s, resumed   -prn miralax only  12/11: had medium formed BM last night: can hold off on enema if having  regular BM.   12/12: having 3 BM per day with incomplete evacuation- had this issue at home as well. Discussed could be secondary to constipation and hemorrhoids. She would like senna to be made as needed, have done so. Discussed high fiber foods and drinking water consistently to help her evacuate more completely.  16. Significant epistaxis (pt has prior history)  -rhino rockets applied. Left one is now out.   -appreciate ENT assessment and recs  -will continue right rhino rocket, left side looks ok this morning.   - spoke with Dr. Benjamine Mola, he will come by to see if we should replace the left sided RR  -  CBC, PTT, PT/INR all stable  -scheduled afrin and ocean nasal spray 17. Insomnia:discussed increasing dose of trazodone but patient prefers not to at this time. Discussed importance of sleep for health, function, participation in therapy.   12/12: slept better last night.  18. 12/12: Patient would like protonix d/ced as she has had negative effects from it before (cannot state what these are- discussed some of the benefits and risks of this medication and she chooses to d/c.  19. 12/12: Palpable nodule (LN?) below right lower jaw- not TTP but patient states hurt last night, continue to monitor.     LOS: 5 days A FACE TO FACE EVALUATION WAS PERFORMED  Katie Nunez P Yvonnia Tango 12/23/2019, 8:20 AM

## 2019-12-24 ENCOUNTER — Inpatient Hospital Stay (HOSPITAL_COMMUNITY): Payer: Medicare Other | Admitting: Occupational Therapy

## 2019-12-24 ENCOUNTER — Inpatient Hospital Stay (HOSPITAL_COMMUNITY): Payer: Medicare Other | Admitting: Speech Pathology

## 2019-12-24 ENCOUNTER — Inpatient Hospital Stay (HOSPITAL_COMMUNITY): Payer: Medicare Other

## 2019-12-24 MED ORDER — PSYLLIUM 95 % PO PACK
1.0000 | PACK | Freq: Every day | ORAL | Status: DC
  Administered 2019-12-24 – 2019-12-26 (×3): 1 via ORAL
  Filled 2019-12-24 (×5): qty 1

## 2019-12-24 MED ORDER — NEPRO/CARBSTEADY PO LIQD
237.0000 mL | Freq: Two times a day (BID) | ORAL | Status: DC
  Administered 2019-12-26 – 2020-01-23 (×34): 237 mL via ORAL
  Filled 2019-12-24: qty 237

## 2019-12-24 MED ORDER — AZELASTINE HCL 0.1 % NA SOLN
1.0000 | Freq: Two times a day (BID) | NASAL | Status: DC
  Administered 2019-12-25 – 2019-12-28 (×7): 1 via NASAL
  Filled 2019-12-24: qty 30

## 2019-12-24 MED ORDER — POLYETHYLENE GLYCOL 3350 17 G PO PACK
17.0000 g | PACK | Freq: Every day | ORAL | Status: DC | PRN
  Filled 2019-12-24: qty 1

## 2019-12-24 NOTE — Progress Notes (Signed)
Occupational Therapy Session Note  Patient Details  Name: Katie Nunez MRN: [id number] Date of Birth: Oct 21, 1955  Today's Date: 12/24/2019 OT Individual Time: 0903-1000 OT Individual Time Calculation (min): 57 min    Short Term Goals: Week 1:  OT Short Term Goal 1 (Week 1): Pt will complete a stand pivot to toilet with mod A of 1. OT Short Term Goal 2 (Week 1): Pt will stand at toilet with mod A of 1. OT Short Term Goal 3 (Week 1): Pt will don shirt with mod A. OT Short Term Goal 4 (Week 1): Pt will don pants over feet with mod A.  Skilled Therapeutic Interventions/Progress Updates:    Patient in bed, alert and ready for therapy session.  She denies pain.  Supine to sidelying min A, sidelying to sitting edge of bed with mod A.  Sit pivot transfer bed to w/c mod A, cues for weight shift.  Completed washing face with set up, UB bathing mod A.  OH shirt mod A with max cues for technique, pants and foot wear max/dep..  Sit pivot transfer to/from mat table with mod A.  She tolerated unsupported sitting with min A to maintain balance and focus on posture, trunk control, proximal stability and weight bearing.  She remained seated in w/c at close of session, seat belt alarm set, call bell in hand, O2 via [place] 3L.    Therapy Documentation Precautions:  Precautions Precautions: Fall Precaution Comments: Rt hemiplegia - subluxation Restrictions Weight Bearing Restrictions: No   Therapy/Group: Individual Therapy  Carlos Levering 12/24/2019, 7:38 AM

## 2019-12-24 NOTE — Progress Notes (Signed)
Physical Therapy Session Note  Patient Details  Name: Katie Nunez MRN: [id number] Date of Birth: [birth year]  Today's Date: 12/24/2019 PT Individual Time: 1428-1540 PT Individual Treatment Time: 72 min   Short Term Goals: Week 1:  PT Short Term Goal 1 (Week 1): Pt will complete bed mobility with minA PT Short Term Goal 2 (Week 1): Pt will perform bed to chair transfer with minA PT Short Term Goal 3 (Week 1): Pt will perform sit to stand with minA PT Short Term Goal 4 (Week 1): Pt will ambulate x30' with modA +1.  Skilled Therapeutic Interventions/Progress Updates:     Pt received seated in recliner and agrees to therapy. Reports feeling "weak as water" today. Also verbalizes need for bowel movement. Stand pivot transfer to [facility] with modA and stand pivot to toilet with maxA for stability and doffing pants and brief. Pt able to perform partial pericare with setup assistance but PT provides majority of pericare with maxA while pt is in standing. MaxA for stand pivot transfer back to [facility]. WC transport to gym for time management. Squat pivot to the R with maxA and cues for body mechanics from WC to mat table. Pt performs NMR for standing balance and activity tolerance. Multiple sit to stand transfers with modA. Mirror placed for visual feedback. Pt performs targeted stepping forward and to the side with L foot. PT blocking R knee and providing heavy modA at bilateral pelvis for majority of standing activity, but occasionally minA/modA when pt is able to shift weight anteriorly more effectively. PT facilitates weight shift to the R to be able to clear left foot for stepping. No active movement or muscular activation noted in R leg or R arm during activity. Pt then performs reaching activity with L arm to encourage dynamic balance and weight shifting, retrieving clothespins from L, R and upper quadrant in front of patient. Squat pivot to the L with modA back to WC. MaxA for squat pivot to the R back to  bed and maxA for sit to supine. Left supine in bed with soft PRAFO and R wrist/hand brace in place. All needs within reach.  Therapy Documentation Precautions:  Precautions Precautions: Fall Precaution Comments: Rt hemiplegia - subluxation Restrictions Weight Bearing Restrictions: No    Therapy/Group: Individual Therapy  Breck Coons 12/24/2019, 2:35 PM

## 2019-12-24 NOTE — Progress Notes (Signed)
Subjective: Pt c/o right ear pain and nasal drainage. No more bleeding.  Objective: Vital signs in last 24 hours: Temp:  [98.1 F (36.7 C)-98.5 F (36.9 C)] 98.2 F (36.8 C) (12/13 0344) Pulse Rate:  [79-91] 90 (12/13 0344) Resp:  [17-18] 18 (12/13 0344) BP: (113-130)/(64-70) 113/64 (12/13 0344) SpO2:  [93 %-99 %] 99 % (12/13 0344) Weight:  [55.6 kg] 55.6 kg (12/13 0500)  Physical Exam: General appearance:alert, cooperative and no distress Head:Normocephalic, without obvious abnormality, atraumatic Eyes: Pupils are equal, round, reactive to light. Extraocular motion is intact.  Ears: Examination of the ears shows normal auricles and external auditory canals bilaterally. Both TMs are intact. No effusion or erythema. Nose:Rhinorocket packing in right [place]. No bleeding is noted today. Face: Facial examination shows no asymmetry. Palpation of the face elicit no significant tenderness.  Mouth: Oral cavity examination shows no mucosal lacerations. No significant trismus is noted.  Neck: Palpation of the neck reveals no lymphadenopathy or mass. The trachea is midline.   Recent Labs    12/22/19 1325  WBC 3.3*  HGB 9.9*  HCT 29.3*  PLT 130*   Recent Labs    12/22/19 1325  NA 133*  K 4.2  CL 97*  CO2 26  GLUCOSE 100*  BUN 47*  CREATININE 6.15*  CALCIUM 9.6    Medications:  I have reviewed the patient's current medications. Scheduled: . ambrisentan  5 mg Oral Daily  . calcium acetate  1,334 mg Oral TID WC  . camphor-menthol   Topical BID  . cephALEXin  500 mg Oral Q12H  . Chlorhexidine Gluconate Cloth  6 each Topical Q0600  . Chlorhexidine Gluconate Cloth  6 each Topical Q0600  . cinacalcet  30 mg Oral Q T,Th,Sa-HD  . doxercalciferol  6 mcg Intravenous Q T,Th,Sa-HD  . fluticasone  1 spray Each Nare Daily  . Gerhardt's butt cream   Topical QID  . hydrocerin   Topical BID  . hydrocortisone  25 mg Rectal BID  . loratadine  10 mg Oral Daily  . lubiprostone  8 mcg  Oral BID WC  . oxymetazoline  1 spray Each Nare BID  . pentoxifylline  400 mg Oral Daily  . psyllium  1 packet Oral Daily  . Selexipag  800 mcg Oral BID  . sodium chloride  1 spray Each Nare 5 X Daily  . tadalafil  20 mg Oral Daily   Continuous:   Assessment/Plan: Pt's epistaxis has resolved. No bleeding over the weekend. - Packing removed without difficulty. - No ear infection noted today. Her right ear pain is likely referred, possibly from the nasal packing. - Continue loratadine for nasal drainage. - May d/c Keflex. - Will sign off. Please call with questions or concerns.   LOS: 6 days   Simone Tuckey W Laticia Vannostrand 12/24/2019, 11:09 AM

## 2019-12-24 NOTE — Progress Notes (Addendum)
[place] PHYSICAL MEDICINE & REHABILITATION PROGRESS NOTE   Subjective/Complaints: Anxious to get right nasal rocket out. Thinks she's constipated. Had questions about resuming coumadin  ROS: Patient denies fever, rash, sore throat, blurred vision, nausea, vomiting, diarrhea, cough, shortness of breath or chest pain, joint or back pain, headache, or mood change.   Objective:   No results found. Recent Labs    12/22/19 1325  WBC 3.3*  HGB 9.9*  HCT 29.3*  PLT 130*   Recent Labs    12/22/19 1325  NA 133*  K 4.2  CL 97*  CO2 26  GLUCOSE 100*  BUN 47*  CREATININE 6.15*  CALCIUM 9.6    Intake/Output Summary (Last 24 hours) at 12/24/2019 4315 Last data filed at 12/24/2019 0805 Gross per 24 hour  Intake 900 ml  Output --  Net 900 ml     Pressure Injury 12/18/19 Sacrum Medial Stage 1 -  Intact skin with non-blanchable redness of a localized area usually over a bony prominence. Red, non-blanchable spot over sacrum area (Active)  12/18/19 1707  Location: Sacrum  Location Orientation: Medial  Staging: Stage 1 -  Intact skin with non-blanchable redness of a localized area usually over a bony prominence.  Wound Description (Comments): Red, non-blanchable spot over sacrum area  Present on Admission: Yes    Physical Exam: Constitutional: No distress . Vital signs reviewed. HEENT: EOMI, oral membranes moist Neck: supple, sl tender? Cardiovascular: RRR without murmur. No JVD    Respiratory/Chest: CTA Bilaterally without wheezes or rales. Normal effort    GI/Abdomen: BS +, non-tender, non-distended Ext: no clubbing, cyanosis, or edema Psych: pleasant and cooperative Skin: Clean and intact without signs of breakdown, LUE fistula intact. Left calf wound packed. ?mild sacral redness Neuro: Pt is cognitively appropriate with normal insight, memory, and awareness. Cranial nerves 2-12 are intact. Seems to sense pain and LT in all 4's. Reflexes are 2+ on left. 3+ right. Fine  motor coordination is intact. No tremors. Motor function is grossly 5/5 LUE and LLE.  RUE and RLE remain 0/5 .  Musculoskeletal: no pain with joint ROM today   Vital Signs Blood pressure 113/64, pulse 90, temperature 98.2 F (36.8 C), temperature source Oral, resp. rate 18, height 5\' 5"  (1.651 m), weight 55.6 kg, last menstrual period 11/15/2008, SpO2 99 %.  Assessment/Plan: 1. Functional deficits which require 3+ hours per day of interdisciplinary therapy in a comprehensive inpatient rehab setting.  Physiatrist is providing close team supervision and 24 hour management of active medical problems listed below.  Physiatrist and rehab team continue to assess barriers to discharge/monitor patient progress toward functional and medical goals  Care Tool:  Bathing    Body parts bathed by patient: Chest,Abdomen,Right upper leg,Left upper leg,Right arm,Face   Body parts bathed by helper: Left arm,Front perineal area,Buttocks,Right lower leg,Left lower leg     Bathing assist Assist Level: Moderate Assistance - Patient 50 - 74%     Upper Body Dressing/Undressing Upper body dressing   What is the patient wearing?: Pull over shirt    Upper body assist Assist Level: Maximal Assistance - Patient 25 - 49%    Lower Body Dressing/Undressing Lower body dressing      What is the patient wearing?: Underwear/pull up,Pants     Lower body assist Assist for lower body dressing: Total Assistance - Patient < 25%     Toileting Toileting    Toileting assist Assist for toileting: Total Assistance - Patient < 25%  Transfers Chair/bed transfer  Transfers assist     Chair/bed transfer assist level: Moderate Assistance - Patient 50 - 74%     Locomotion Ambulation   Ambulation assist      Assist level: 2 helpers Assistive device: Hand held assist Max distance: 30'   Walk 10 feet activity   Assist     Assist level: 2 helpers Assistive device: Hand held assist   Walk 50  feet activity   Assist Walk 50 feet with 2 turns activity did not occur: Safety/medical concerns         Walk 150 feet activity   Assist Walk 150 feet activity did not occur: Safety/medical concerns         Walk 10 feet on uneven surface  activity   Assist Walk 10 feet on uneven surfaces activity did not occur: Safety/medical concerns         Wheelchair     Assist Will patient use wheelchair at discharge?: Yes Type of Wheelchair: Manual Wheelchair activity did not occur: Safety/medical concerns         Wheelchair 50 feet with 2 turns activity    Assist    Wheelchair 50 feet with 2 turns activity did not occur: Safety/medical concerns       Wheelchair 150 feet activity     Assist  Wheelchair 150 feet activity did not occur: Safety/medical concerns       Blood pressure 113/64, pulse 90, temperature 98.2 F (36.8 C), temperature source Oral, resp. rate 18, height 5\' 5"  (1.651 m), weight 55.6 kg, last menstrual period 11/15/2008, SpO2 99 %.  Medical Problem List and Plan: 1.  L frontal ICH secondary to recent starting of Coumadin- Coumadin on hold with R hemiplegia             -patient may shower             -ELOS/Goals: Supervision to mod I in 2-3 weeks  --Continue CIR therapies including PT, OT, and SLP.   2.  Portal vein thrombosis/A fib/Antithrombotics: -DVT/anticoagulation:  Pharmaceutical: SCDs due to thrombocytopenia and recent bleed.  --per neuro no coumadin-->will need to wait at least one month due to size of bleed.  --dopplers ok             -antiplatelet therapy: N/A 3. Pain Management: tylenol prn.   - kpad for neck pain  -pt to work with pt today, might benefit from massage, K-tape  -baclofen added in place of robaxin for RLE spasms  12/13:   right sided shoulder pain and ear ache yesterday:   -Sudafed, Tramadol, Robaxin, Klonopin, and kpad given with resolution 4. Mood: LCSW to follow up for evaluation and support.               -antipsychotic agents: N/A 5. Neuropsych: This patient is capable of making decisions on her own behalf. 6. Wound ulcer/Skin/Wound Care:Pack daily wound care LLE.    -turning, nutrition, education 7. Fluids/Electrolytes/Nutrition: Strict I/O.  12/12: labs with HD TTS 8. ESRD: Continue HD on TTS at the end of the day to help with therapy tolerance.  9. HTN: Monitor BP tid-currently controlled on current regimen 10. Pulmonary HTN: Oxygen dependent. Continue Ambrisentan and Selexipag. On Home dose 2L by [place] 11. Cirrhosis of the liver: Monitor platelets and for any signs of bleeding. Reporting abdominal distension.  (protal vein thrombus)  -f/u abd CT as outpt 12. Chronic systolic CHF: Monitor daily weights as well as I/O. Continue tadalafil.  Filed Weights   12/22/19 1740 12/23/19 0445 12/24/19 0500  Weight: 54.2 kg 55.3 kg 55.6 kg    -volume mgt per nephro 13. CAF: Monitor HR tid--metoprolol has been on hold. Coumadin to be held due to [place].  14. Pancytopenia: WBC 3.3-->2.8,  Platelets 130-->105, hgb 11.5-->10.4. Continue to monitor with serial checks.   -12/8 HGB/PLT stable today 10.7/101  12/13 hgb drop d/t nose bleed likely--follow up in HD tomorrow   15. Constipation WITH hemorrhoids- seems to cycle between loose stool  -prn miralax only  12/13 pt with mushy,loose stools yet seems to report constipation   -backed of scheduled senna-s for now   -add daily fiber packet 16. Significant epistaxis (pt has prior history)  -rhino rockets applied. Left one is now out.   -appreciate ENT assessment and recs  -will continue right rhino rocket, left side looks ok this morning.   - spoke with Dr. Benjamine Mola, he will come by to see if we should replace the left sided RR  - scheduled afrin and ocean nasal spray, keflex 500mg  bid  -Dr. Benjamine Mola to come by today 12/13 to remove packing 17. Insomnia:discussed increasing dose of trazodone but patient prefers not to at this time. Discussed importance of  sleep for health, function, participation in therapy.   12/12: slept better last night.  18. 12/12: protonix dced per pt request 19. 12/12: Palpable nodule (LN?) below right lower jaw- not TTP but patient states hurt last night, continue to monitor.  improved 12/13    LOS: 6 days A FACE TO FACE EVALUATION WAS PERFORMED  Katie Nunez 12/24/2019, 9:09 AM

## 2019-12-24 NOTE — Progress Notes (Signed)
Speech Language Pathology Daily Session Note  Patient Details  Name: Katie Nunez MRN: [id number] Date of Birth: [birth year]  Today's Date: 12/24/2019 SLP Individual Time: 1015-1100 SLP Individual Time Calculation (min): 45 min and Today's Date: 12/24/2019 SLP Missed Time: 15 Minutes Missed Time Reason: Toileting  Short Term Goals: Week 1: SLP Short Term Goal 1 (Week 1): Patient will demonstrate complex problem solving for functional and familiar tasks with supervision verbal cues. SLP Short Term Goal 2 (Week 1): Patient will recall new, daily information with Min verbal cues for use of memory compensatory strategies. SLP Short Term Goal 3 (Week 1): Patient will demonstrate selective attention to tasks in a mildly distracting enviornment for 45 minutes with Min verbal cues for redirection.  Skilled Therapeutic Interventions: Skilled treatment session focused on cognitive goals. SLP facilitated session by providing Min A verbal cues for recall of her current medications and their functions. Max A verbal cues were also needed for recall of events from this morning in regards to medication administration. Patient became slightly frustrated when talking about medications due to perceived confusion between scheduled and PRN medications. Patient left upright in wheelchair with ENT present. Continue with current plan of care.      Pain No/Denies Pain   Therapy/Group: Individual Therapy  Skye Rodarte 12/24/2019, 12:09 PM

## 2019-12-25 ENCOUNTER — Inpatient Hospital Stay (HOSPITAL_COMMUNITY): Payer: Medicare Other

## 2019-12-25 ENCOUNTER — Inpatient Hospital Stay (HOSPITAL_COMMUNITY): Payer: Medicare Other | Admitting: Occupational Therapy

## 2019-12-25 MED ORDER — DIPHENHYDRAMINE HCL 25 MG PO CAPS
25.0000 mg | ORAL_CAPSULE | Freq: Two times a day (BID) | ORAL | Status: DC
  Administered 2019-12-25 – 2019-12-28 (×7): 25 mg via ORAL
  Filled 2019-12-25 (×10): qty 1

## 2019-12-25 NOTE — Progress Notes (Addendum)
[place] PHYSICAL MEDICINE & REHABILITATION PROGRESS NOTE   Subjective/Complaints: Didn't sleep well as she felt very "itchy" no rash. Happy to have nasal rocket out. Right hand feels achy.   ROS: Patient denies fever, rash, sore throat, blurred vision, nausea, vomiting, diarrhea, cough, shortness of breath or chest pain, joint or back pain, headache, or mood change.   Objective:   No results found. Recent Labs    12/22/19 1325  WBC 3.3*  HGB 9.9*  HCT 29.3*  PLT 130*   Recent Labs    12/22/19 1325  NA 133*  K 4.2  CL 97*  CO2 26  GLUCOSE 100*  BUN 47*  CREATININE 6.15*  CALCIUM 9.6    Intake/Output Summary (Last 24 hours) at 12/25/2019 0900 Last data filed at 12/24/2019 2118 Gross per 24 hour  Intake 220 ml  Output --  Net 220 ml     Pressure Injury 12/18/19 Sacrum Medial Stage 1 -  Intact skin with non-blanchable redness of a localized area usually over a bony prominence. Red, non-blanchable spot over sacrum area (Active)  12/18/19 1707  Location: Sacrum  Location Orientation: Medial  Staging: Stage 1 -  Intact skin with non-blanchable redness of a localized area usually over a bony prominence.  Wound Description (Comments): Red, non-blanchable spot over sacrum area  Present on Admission: Yes    Physical Exam: Constitutional: No distress . Vital signs reviewed. HEENT: EOMI, oral membranes moist Neck: supple Cardiovascular: RRR without murmur. No JVD    Respiratory/Chest: CTA Bilaterally without wheezes or rales. Normal effort    GI/Abdomen: BS +, non-tender, non-distended Ext: no clubbing, cyanosis, or edema Psych: pleasant and cooperative Skin:   LUE fistula intact. Left calf wound dressed. mild sacral redness Neuro: Pt is cognitively appropriate with normal insight, memory, and awareness. Cranial nerves 2-12 are intact. Seems to sense pain and LT in all 4's. Reflexes are 2+ on left. 3+ right. Fine motor coordination is intact. No tremors. Motor  function is grossly 5/5 LUE and LLE.  RUE and RLE 0/5.   Musculoskeletal: mild right wrist hand/tenderness with palpation/rom.    Vital Signs Blood pressure 110/63, pulse 87, temperature 98.5 F (36.9 C), temperature source Oral, resp. rate 18, height 5\' 5"  (1.651 m), weight 60.4 kg, last menstrual period 11/15/2008, SpO2 96 %.  Assessment/Plan: 1. Functional deficits which require 3+ hours per day of interdisciplinary therapy in a comprehensive inpatient rehab setting.  Physiatrist is providing close team supervision and 24 hour management of active medical problems listed below.  Physiatrist and rehab team continue to assess barriers to discharge/monitor patient progress toward functional and medical goals  Care Tool:  Bathing    Body parts bathed by patient: Chest,Abdomen,Right upper leg,Left upper leg,Right arm,Face   Body parts bathed by helper: Left arm,Front perineal area,Buttocks,Right lower leg,Left lower leg     Bathing assist Assist Level: Moderate Assistance - Patient 50 - 74%     Upper Body Dressing/Undressing Upper body dressing   What is the patient wearing?: Pull over shirt    Upper body assist Assist Level: Moderate Assistance - Patient 50 - 74%    Lower Body Dressing/Undressing Lower body dressing      What is the patient wearing?: Pants     Lower body assist Assist for lower body dressing: Total Assistance - Patient < 25%     Toileting Toileting    Toileting assist Assist for toileting: Total Assistance - Patient < 25%     Transfers Chair/bed  transfer  Transfers assist     Chair/bed transfer assist level: Maximal Assistance - Patient 25 - 49%     Locomotion Ambulation   Ambulation assist      Assist level: 2 helpers Assistive device: Hand held assist Max distance: 30'   Walk 10 feet activity   Assist     Assist level: 2 helpers Assistive device: Hand held assist   Walk 50 feet activity   Assist Walk 50 feet with 2  turns activity did not occur: Safety/medical concerns         Walk 150 feet activity   Assist Walk 150 feet activity did not occur: Safety/medical concerns         Walk 10 feet on uneven surface  activity   Assist Walk 10 feet on uneven surfaces activity did not occur: Safety/medical concerns         Wheelchair     Assist Will patient use wheelchair at discharge?: Yes Type of Wheelchair: Manual Wheelchair activity did not occur: Safety/medical concerns         Wheelchair 50 feet with 2 turns activity    Assist    Wheelchair 50 feet with 2 turns activity did not occur: Safety/medical concerns       Wheelchair 150 feet activity     Assist  Wheelchair 150 feet activity did not occur: Safety/medical concerns       Blood pressure 110/63, pulse 87, temperature 98.5 F (36.9 C), temperature source Oral, resp. rate 18, height 5\' 5"  (1.651 m), weight 60.4 kg, last menstrual period 11/15/2008, SpO2 96 %.  Medical Problem List and Plan: 1.  L frontal ICH secondary to recent starting of Coumadin- Coumadin on hold with R hemiplegia             -patient may shower             -ELOS/Goals: Supervision to mod I in 2-3 weeks  --Continue CIR therapies including PT, OT, and SLP. Team conf today   2.  Portal vein thrombosis/A fib/Antithrombotics: -DVT/anticoagulation:  Pharmaceutical: SCDs due to thrombocytopenia and recent bleed.  --per neuro no coumadin-->will need to wait at least one month due to size of bleed.  --dopplers ok             -antiplatelet therapy: N/A 3. Pain Management: tylenol prn.   - kpad for neck pain  -pt to work with pt today, might benefit from massage, K-tape  -baclofen added in place of robaxin for RLE spasms  -right sided shoulder pain and ear ache yesterday:   -resolved 4. Mood: LCSW to follow up for evaluation and support.              -antipsychotic agents: N/A 5. Neuropsych: This patient is capable of making decisions on her  own behalf. 6. Wound ulcer/Skin/Wound Care:Pack daily wound care LLE.    -turning, nutrition, education  12/14 -will add scheduled benadryl for itching. Also receiving sarna        -prn vistaril 7. Fluids/Electrolytes/Nutrition: Strict I/O.  12/14: labs with HD TTS 8. ESRD: Continue HD on TTS at the end of the day to help with therapy tolerance.  9. HTN: Monitor BP tid-currently controlled on current regimen 10. Pulmonary HTN: Oxygen dependent. Continue Ambrisentan and Selexipag. On Home dose 2L by [place] 11. Cirrhosis of the liver: Monitor platelets and for any signs of bleeding. Reporting abdominal distension.  (protal vein thrombus)  -f/u abd CT as outpt 12. Chronic systolic CHF: Monitor daily  weights as well as I/O. Continue tadalafil.    Filed Weights   12/23/19 0445 12/24/19 0500 12/25/19 0500  Weight: 55.3 kg 55.6 kg 60.4 kg    -volume mgt per nephro 13. CAF: Monitor HR tid--metoprolol has been on hold. Coumadin to be held due to [place].  14. Pancytopenia:  Continue to monitor with serial checks.   12/14 hgb drop (11.1--> 9.9) d/t nose bleed likely--follow up in HD today  15. Constipation WITH hemorrhoids- seems to cycle between loose stool  -prn miralax only  12/13 pt with mushy,loose stools yet seems to report constipation   -backed of scheduled senna-s for now   -added daily fiber packet  -encouraging regular solid/liquid intake as well  -stopped amitiza  16. Significant epistaxis (pt has prior history)  -resolved, nasal rockets out.   -appreciate Dr. Deeann Saint help  -keflex dc'ed  -maintain nasal spray, keep nose moist 17. Insomnia:discussed increasing dose of trazodone but patient prefers not to at this time. Discussed importance of sleep for health, function, participation in therapy.   12/14--pruritus affects sleep.  18. 12/12: protonix dced per pt request 19. 12/12: Palpable nodule (LN?) below right lower jaw- not TTP but patient states hurt last night, continue to monitor.   improved 12/13    LOS: 7 days A FACE TO FACE EVALUATION WAS PERFORMED  Meredith Staggers 12/25/2019, 9:00 AM

## 2019-12-25 NOTE — Progress Notes (Signed)
Physical Therapy Session Note  Patient Details  Name: Katie Nunez MRN: [id number] Date of Birth: [birth year]  Today's Date: 12/25/2019 PT Individual Time: 1102-1200 PT Individual Time Calculation (min): 58 min   Short Term Goals: Week 1:  PT Short Term Goal 1 (Week 1): Pt will complete bed mobility with minA PT Short Term Goal 2 (Week 1): Pt will perform bed to chair transfer with minA PT Short Term Goal 3 (Week 1): Pt will perform sit to stand with minA PT Short Term Goal 4 (Week 1): Pt will ambulate x30' with modA +1.  Skilled Therapeutic Interventions/Progress Updates:     Pt received seated in [facility] and agrees to therapy. Reports aching/tightness pain in R hand. Number not provided. Pt also reporting soreness in R elbow. PT provides pt with elbow pad to address pain symptoms and protect elbow. WC transport to gym for time management. Pt practices seated scooting with focus on head-hips relationship, with tactile cues on body mechanics and sequencing. ModA for squat pivot to mat table toward R. Pt performs sit to stand with mod/maxA with PT blocking R leg and mirror positioned for visual feedback. Pt works to attain midline positioning with good posture, with cues to engage bilateral glutes weight shift toward the L due to consistent posterior COG with forward flexed posture as well as R sided lean. Pt performs sit to supine with modA and then positioned in L sidelying. PT performs PROM of R leg and provides verbal and tactile cues for muscular facilitation to attempt hip extension and hip flexion. No palpable contraction noted. L sidelying to sit with modA. Pt then performs R side leaning on R elbow to approximate joints in R arm and provide WB through R hemibody. Pt then cued on body mechanics to transfer from R sideleaning to sitting up at midline. Pt performs x5 reps on R with extended hold in R side leaning position, and x5 reps on L. Squat pivot back to chair with modA. Pt left seated in  WC with R lap tray, alarm intact, and all needs within reach.  Therapy Documentation Precautions:  Precautions Precautions: Fall Precaution Comments: Rt hemiplegia - subluxation Restrictions Weight Bearing Restrictions: No   Therapy/Group: Individual Therapy  Breck Coons, PT, DPT 12/25/2019, 12:13 PM

## 2019-12-25 NOTE — Progress Notes (Signed)
Patient ID: Katie Nunez, female   DOB: 21-Jun-1955, 64 y.o.   MRN: [id number]  SW met with pt in room to provide updates from team conference, and d/c date 12/31. SW called pt husband Ethel Rana [phone]) while in room with pt requesting return phone call to discuss updates. SW discussed with pt plan of care at discharge and if she will have additional supports. Pt stated husband would be available at times when he is not at work. SW encouraged pt to explore natural supports that may be able to assist her when husband is not available. Pt asked about other options. SW informed on short term rehab in SNF. SW explained to pt SNF is last resort, however, encouraged her to explore natural supports. SW stated will send out SNF referrals, and will bring by SNF list for her to review.  *SW received return phone call from pt husband [place]. SW discussed above with him. SW encouraged family education for him to determine if they both feel they are able to manage care needs at home; and reminded him pt has the potential to continue to make gains in rehab. Family education scheduled for 12/22 1pm-3pm and 12/27 1pm-3pm.   Loralee Pacas, MSW, [place] Office: [phone] Cell: [phone] Fax: [phone]

## 2019-12-25 NOTE — Progress Notes (Signed)
Pt c/o itching, refused scheduled benadryl and requested prn vistaril Pt was educated on use of scheduled medication and then the need for prn medication. Pt stated vistaril works better for her. No other concerns to report.

## 2019-12-25 NOTE — Progress Notes (Signed)
Speech Language Pathology Daily Session Note  Patient Details  Name: Katie Nunez MRN: [id number] Date of Birth: 08-11-1955  Today's Date: 12/25/2019 SLP Individual Time: 1003-1100 SLP Individual Time Calculation (min): 57 min  Short Term Goals: Week 1: SLP Short Term Goal 1 (Week 1): Patient will demonstrate complex problem solving for functional and familiar tasks with supervision verbal cues. SLP Short Term Goal 2 (Week 1): Patient will recall new, daily information with Min verbal cues for use of memory compensatory strategies. SLP Short Term Goal 3 (Week 1): Patient will demonstrate selective attention to tasks in a mildly distracting enviornment for 45 minutes with Min verbal cues for redirection.  Skilled Therapeutic Interventions:Skilled ST services focused on cognitive skills. SLP facilitated complex problem solving skills in medication management tasks with BID pill organizer. Pt demonstrated great recall of medication consumed at baseline and novel medication using visual aid. Pt demonstrated ability to fill out pill organizer with set up assist and required min A verbal cues for recall within the task. SLP educated pt on use of recall strategies, such as note taking. All questions were answered to satisfaction. Pt was left in room with call bell within reach and chair alarm set. SLP recommends to continue skilled services.     Pain Pain Assessment Pain Score: 4  Faces Pain Scale: Hurts a little bit  Therapy/Group: Individual Therapy  Katie Nunez  [facility] 12/25/2019, 9:51 AM

## 2019-12-25 NOTE — Progress Notes (Signed)
Patient has a pain level of 7/10 but refused pain medication at this time.  RN will re-evaluate in an hour.

## 2019-12-25 NOTE — Progress Notes (Signed)
Subjective:  Seen  eating supper , no cos ,nasal packing out, Hd postponed till tomor 2/2   emergent pts   Objective Vital signs in last 24 hours: Vitals:   12/24/19 1933 12/25/19 0337 12/25/19 0500 12/25/19 1429  BP: 115/65 110/63  128/70  Pulse: 91 87  79  Resp: 18 18  17   Temp: 98.4 F (36.9 C) 98.5 F (36.9 C)  97.7 F (36.5 C)  TempSrc: Oral Oral    SpO2: 98% 96%  (!) 86%  Weight:   60.4 kg   Height:       Weight change: 4.84 kg  Physical Exam: General: alert , NAD  Heart: RRR, no mgr  Lungs: CTA Abdomen: bs +, soft , NT, ND Extremities: no pedal  Edema Dialysis Access: LUA AVF + bruit     HD orders: [place] T, Th,S 3.5 hrs 450/600 2.0 K/2.25 Ca 58.5 kg UFP 2 linear NaL AVF -No Heparin -Mircera 50 mcg IV q 4 weeks -Hectorol 6 mcg IV TIW -Sensipar 30 mg PO TIW   Problem/Plan: # ICH-thought secondary to fairly new Coumadin therapy. Coumadinwasheld, reversing agents given,neurologymanaging.Repeat CT of head 12/02 without evidence of extension of hematoma. Now in CIR.  #ESRD:on  TTS schedule. Hd tomor 2/2 pt vol load and emergent cases/back on schedule before dc    #Hypertension/Volume- No evidence of volume overload.UF as tol during HD.  Monitor blood pressure.  #Anemia of ESRD:Hgb  9.9 now with hgb<10 restart  Low dose Aranesp. Slight drop in hemoglobin probably because of blood loss from epistaxis, avoiding anticoagulation.  #Secondary hyperparathyroidism: Phosphorus level improved 4.9  Calcium 10.6  corrected   with higher dose of PhosLo.  Continue Sensipar and Hectorol.will hold with ^ ca , use 2.o ca bath  Fu trend   #pulmonary HTN. Follows with cardiology at [facility]. Currently on tadalafil and Uptravi for her pulmonary hypertension.Marland Kitchen Avoid hypotension.   #Epistaxis: Seen by ENT, has nasal packing.  On Keflex has been given    Ernest Haber, PA-C [facility] Beeper [phone] 12/25/2019,5:20 PM  LOS: 7 days   Labs: Basic  Metabolic Panel: Recent Labs  Lab 12/20/19 1358 12/22/19 1325  NA 134* 133*  K 4.1 4.2  CL 96* 97*  CO2 24 26  GLUCOSE 100* 100*  BUN 56* 47*  CREATININE 6.81* 6.15*  CALCIUM 9.0 9.6  PHOS 5.2* 4.9*   Liver Function Tests: Recent Labs  Lab 12/20/19 1358 12/22/19 1325  ALBUMIN 2.6* 2.7*   No results for input(s): LIPASE, AMYLASE in the last 168 hours. No results for input(s): AMMONIA in the last 168 hours. CBC: Recent Labs  Lab 12/19/19 0442 12/20/19 1038 12/22/19 1325  WBC 2.9* 3.1* 3.3*  NEUTROABS 1.7  --   --   HGB 10.7* 11.1* 9.9*  HCT 33.9* 33.4* 29.3*  MCV 92.9 90.8 89.1  PLT 101* 161 130*   Cardiac Enzymes: No results for input(s): CKTOTAL, CKMB, CKMBINDEX, TROPONINI in the last 168 hours. CBG: No results for input(s): GLUCAP in the last 168 hours.  Studies/Results: No results found. Medications:  . ambrisentan  5 mg Oral Daily  . azelastine  1 spray Each Nare BID  . calcium acetate  1,334 mg Oral TID WC  . camphor-menthol   Topical BID  . Chlorhexidine Gluconate Cloth  6 each Topical Q0600  . Chlorhexidine Gluconate Cloth  6 each Topical Q0600  . cinacalcet  30 mg Oral Q T,Th,Sa-HD  . diphenhydrAMINE  25 mg Oral Q12H  .  doxercalciferol  6 mcg Intravenous Q T,Th,Sa-HD  . feeding supplement (NEPRO CARB STEADY)  237 mL Oral BID BM  . Gerhardt's butt cream   Topical QID  . hydrocerin   Topical BID  . hydrocortisone  25 mg Rectal BID  . loratadine  10 mg Oral Daily  . oxymetazoline  1 spray Each Nare BID  . pentoxifylline  400 mg Oral Daily  . psyllium  1 packet Oral Daily  . Selexipag  800 mcg Oral BID  . sodium chloride  1 spray Each Nare 5 X Daily  . tadalafil  20 mg Oral Daily

## 2019-12-25 NOTE — Progress Notes (Signed)
Patient states, " I'm still having pain, but I don't want pain medicine now."  RN asked if she wanted to take some medicine prior to dialysis.  Patient stated, "No I don't think so."

## 2019-12-25 NOTE — Progress Notes (Signed)
Occupational Therapy Session Note  Patient Details  Name: Katie Nunez MRN: [id number] Date of Birth: 04/26/1955  Today's Date: 12/25/2019 OT Individual Time: 4287-6811 OT Individual Time Calculation (min): 73 min    Short Term Goals: Week 1:  OT Short Term Goal 1 (Week 1): Pt will complete a stand pivot to toilet with mod A of 1. OT Short Term Goal 2 (Week 1): Pt will stand at toilet with mod A of 1. OT Short Term Goal 3 (Week 1): Pt will don shirt with mod A. OT Short Term Goal 4 (Week 1): Pt will don pants over feet with mod A.  Skilled Therapeutic Interventions/Progress Updates:  Patient greeted semi-reclined in bed and agreeable to shower today. Pt initially thought she had been incontinent of BM, but brief was clean. Treatment session focused on functional transfers, functional use of R UE, improved sit<>stand, standing tolerance, and adapted bathing/dressing skills. Pt came to sitting EOB with max  A. Stand-pivot transfers bed>wc>shower chair with overall mod A and facilitation for head/hips relationship. . Incorporated R NMR weight bearing techniques within bathing tasks with hand over hand A. LB/UB dressing completed wc level at the sink with assistance to thread R LE into clothing and assist to pull up pants. UB dressing with max A overall 2/2 R hemiplegia. Incorporate weight bearing through R UE when standing at the sink. Pt left seated in wc at end of session with safety belt on, call bell in reach, and set up with breakfast.   Therapy Documentation Precautions:  Precautions Precautions: Fall Precaution Comments: Rt hemiplegia - subluxation Restrictions Weight Bearing Restrictions: No Pain: Pain Assessment Pain Score: 4  Faces Pain Scale: Hurts a little bit  Patient reports mild pain in R shoulder. Rest and repositioned for comfort   Therapy/Group: Individual Therapy  Valma Cava 12/25/2019, 9:00 AM

## 2019-12-25 NOTE — Patient Care Conference (Signed)
Inpatient RehabilitationTeam Conference and Plan of Care Update Date: 12/25/2019   Time: 10:57 AM    Patient Name: Katie Nunez      Medical Record Number: [id number]  Date of Birth: 1955/03/21 Sex: Female         Room/Bed: 4M03C/4M03C-01 Payor Info: Payor: Theme park manager MEDICARE / Plan: [facility] MEDICARE / Product Type: *No Product type* /    Admit Date/Time:  12/18/2019  4:05 PM  Primary Diagnosis:  Intraparenchymal hemorrhage of brain [facility])  Hospital Problems: Principal Problem:   Intraparenchymal hemorrhage of brain [facility]) Active Problems:   Pressure injury of skin    Expected Discharge Date: Expected Discharge Date: 01/11/20  Team Members Present: Physician leading conference: Dr. Alger Simons Care Coodinator Present: Loralee Pacas, LCSWA;Willmar Stockinger Creig Hines, RN, BSN, [place] Nurse Present: Other (comment) Demetrios Loll, RN) PT Present: Tereasa Coop, PT OT Present: Cherylynn Ridges, OT SLP Present: Weston Anna, SLP PPS Coordinator present : Ileana Ladd, Burna Mortimer, SLP     Current Status/Progress Goal Weekly Team Focus  Bowel/Bladder   Having loose stools,continent bladder  continent B/B  Stools to bulk up   Swallow/Nutrition/ Hydration             ADL's   Mod/max A transfers, mod A UB ADLs, Max A LB ADLs  min/CGA  transfers, self-care retraining, activity tolerance, R UE NMR, NMES   Mobility   modA bed mobility, modA sit to stand and stand pivot transfers, maxA squat pivot, +2 gait x30' with wall rail and HHA  supervision bed level, minA transfers and ambulation household distances  LLE NMR, transfers, bed mobility, balance, ambulation   Communication             Safety/Cognition/ Behavioral Observations  Supervision-Mod I  Mod I  complex problem solving, recall and attention   Pain   No c/o pain  <3 on a 0-10 pain scale  assess pain q 4 hr and prn   Skin   CDI  remain free of breakdown  assess skin q shift and prn     Discharge Planning:  D/c  to home with her husband who will provide intermittent support as he is a Theme park manager. Their adult son will help PRN as he works.   Team Discussion: Had extensive nose bleed, Rhino rockets out now, keeping nasal passages moist. Having loose stools, request to stop taking bowel medication. OT reports patient showered today and is a max assist for transfers. PT reports patient is a mod assist overall and has nothing on the left. SLP reports working on problem solving, recall, and patient gets frustrated easily. Patient on target to meet rehab goals: yes  *See Care Plan and progress notes for long and short-term goals.   Revisions to Treatment Plan:  Continue to monitor for nose bleeds.  Teaching Needs: Continue with family education.  Current Barriers to Discharge: Decreased caregiver support, Home enviroment access/layout, Lack of/limited family support, Behavior and Nutritional means  Possible Resolutions to Barriers: Continue current medications, offer nutritional support, provide emotional support to patient and family.     Medical Summary Current Status: left frontal ICH. significant nose bleeds which have resolved. cirrhosis of liver. loose stools after being constipated  Barriers to Discharge: Medical stability   Possible Resolutions to Barriers/Weekly Focus: daiy review of labs/data, adjusting med regimen, keep nose moist/ use nasal sprays.   Continued Need for Acute Rehabilitation Level of Care: The patient requires daily medical management by a physician with specialized training in physical medicine  and rehabilitation for the following reasons: Direction of a multidisciplinary physical rehabilitation program to maximize functional independence : Yes Medical management of patient stability for increased activity during participation in an intensive rehabilitation regime.: Yes Analysis of laboratory values and/or radiology reports with any subsequent need for medication adjustment  and/or medical intervention. : Yes   I attest that I was present, lead the team conference, and concur with the assessment and plan of the team.   Cristi Loron 12/25/2019, 4:19 PM

## 2019-12-26 ENCOUNTER — Inpatient Hospital Stay (HOSPITAL_COMMUNITY): Payer: Medicare Other

## 2019-12-26 ENCOUNTER — Inpatient Hospital Stay (HOSPITAL_COMMUNITY): Payer: Medicare Other | Admitting: Speech Pathology

## 2019-12-26 LAB — RENAL FUNCTION PANEL
Albumin: 2.8 g/dL — ABNORMAL LOW (ref 3.5–5.0)
Anion gap: 17 — ABNORMAL HIGH (ref 5–15)
BUN: 85 mg/dL — ABNORMAL HIGH (ref 8–23)
CO2: 21 mmol/L — ABNORMAL LOW (ref 22–32)
Calcium: 10 mg/dL (ref 8.9–10.3)
Chloride: 94 mmol/L — ABNORMAL LOW (ref 98–111)
Creatinine, Ser: 9.45 mg/dL — ABNORMAL HIGH (ref 0.44–1.00)
GFR, Estimated: 4 mL/min — ABNORMAL LOW (ref >60)
Glucose, Bld: 82 mg/dL (ref 70–99)
Phosphorus: 6 mg/dL — ABNORMAL HIGH (ref 2.5–4.6)
Potassium: 4.4 mmol/L (ref 3.5–5.1)
Sodium: 132 mmol/L — ABNORMAL LOW (ref 135–145)

## 2019-12-26 LAB — CBC
HCT: 28.6 % — ABNORMAL LOW (ref 36.0–46.0)
Hemoglobin: 9.4 g/dL — ABNORMAL LOW (ref 12.0–15.0)
MCH: 29.5 pg (ref 26.0–34.0)
MCHC: 32.9 g/dL (ref 30.0–36.0)
MCV: 89.7 fL (ref 80.0–100.0)
Platelets: 174 10*3/uL (ref 150–400)
RBC: 3.19 MIL/uL — ABNORMAL LOW (ref 3.87–5.11)
RDW: 15.9 % — ABNORMAL HIGH (ref 11.5–15.5)
WBC: 3 10*3/uL — ABNORMAL LOW (ref 4.0–10.5)
nRBC: 0 % (ref 0.0–0.2)

## 2019-12-26 MED ORDER — TRAMADOL HCL 50 MG PO TABS
25.0000 mg | ORAL_TABLET | Freq: Three times a day (TID) | ORAL | Status: DC | PRN
  Administered 2019-12-26 – 2019-12-28 (×3): 25 mg via ORAL
  Filled 2019-12-26 (×4): qty 1

## 2019-12-26 MED ORDER — SENNOSIDES-DOCUSATE SODIUM 8.6-50 MG PO TABS
1.0000 | ORAL_TABLET | Freq: Every day | ORAL | Status: DC
  Administered 2019-12-26 – 2019-12-27 (×2): 1 via ORAL
  Filled 2019-12-26 (×2): qty 1

## 2019-12-26 MED ORDER — CYCLOBENZAPRINE HCL 5 MG PO TABS
5.0000 mg | ORAL_TABLET | Freq: Three times a day (TID) | ORAL | Status: DC | PRN
  Administered 2019-12-26 – 2020-01-16 (×11): 5 mg via ORAL
  Filled 2019-12-26 (×14): qty 1

## 2019-12-26 NOTE — Progress Notes (Signed)
Speech Language Pathology Weekly Progress and Session Note  Patient Details  Name: KAROLE OO MRN: [id number] Date of Birth: 10-23-1955  Beginning of progress report period: December 19, 2019 End of progress report period: December 26, 2019  Today's Date: 12/26/2019 SLP Individual Time: 1700-1749 SLP Individual Time Calculation (min): 55 min  Short Term Goals: Week 1: SLP Short Term Goal 1 (Week 1): Patient will demonstrate complex problem solving for functional and familiar tasks with supervision verbal cues. SLP Short Term Goal 1 - Progress (Week 1): Met SLP Short Term Goal 2 (Week 1): Patient will recall new, daily information with Min verbal cues for use of memory compensatory strategies. SLP Short Term Goal 2 - Progress (Week 1): Met SLP Short Term Goal 3 (Week 1): Patient will demonstrate selective attention to tasks in a mildly distracting enviornment for 45 minutes with Min verbal cues for redirection. SLP Short Term Goal 3 - Progress (Week 1): Met    New Short Term Goals: Week 2: SLP Short Term Goal 1 (Week 2): Patient will demonstrate complex problem solving for functional and familiar tasks withMod I. SLP Short Term Goal 2 (Week 2): Patient will recall new, daily information with Supervision verbal cues for use of memory compensatory strategies. SLP Short Term Goal 3 (Week 2): Patient will demonstrate selective attention to tasks in a mildly distracting enviornment for 45 minutes with Supervision verbal cues for redirection.  Weekly Progress Updates: Patient has made functional gains and has met 3 of 3 STGs this reporting period. Currently, patient requires overall Min A-supervision level verbal cues to complete functional and mildly complex tasks safely in regards to problem solving, recall and attention. Patient education is ongoing. Patient would benefit from continued skilled SLP intervention to maximize her cognitive functioning and overall functional independence  prior to discharge.     Intensity: Minumum of 1-2 x/day, 30 to 90 minutes Frequency: 3 to 5 out of 7 days Duration/Length of Stay: 01/11/20 Treatment/Interventions: Cognitive remediation/compensation;Internal/external aids;Cueing hierarchy;Environmental controls;Therapeutic Activities;Functional tasks;Patient/family education   Daily Session  Skilled Therapeutic Interventions: Skilled treatment session focused on cognitive goals. SLP facilitated session by providing overall Min A verbal cues to generate a list of memory compensatory strategies to utilize to maximize recall and carryover of information now that the patient is unable to utilize her RUE for writing. SLP discussed using video recording from her phone to record questions, writing questions/informaiton down in the notes section while utilizing a stylus to maximize accuracy and talk to text. Patient verbalized understanding and agreement. SLP gave patient a stylus which she used with increased accuracy although she verbalize decreased speech with task. Also discussed organizing of apps and deleting unused apps to minimize ease of finding pertinent apps for memory. Patient left upright in bed with alarm on and all needs within reach. Continue with current plan of care.   Pain Pain Assessment Pain Scale: 0-10 Pain Score: 3  Pain Type: Acute pain Pain Location: Generalized Pain Descriptors / Indicators: Aching Pain Frequency: Occasional Pain Onset: Gradual Patients Stated Pain Goal: 0 Pain Intervention(s): Medication (See eMAR)  Therapy/Group: Individual Therapy  Aliegha Paullin, [place] 12/26/2019, 6:50 AM

## 2019-12-26 NOTE — Progress Notes (Signed)
Occupational Therapy Session Note  Patient Details  Name: Katie Nunez MRN: [id number] Date of Birth: 10/19/1955  Today's Date: 12/26/2019 OT Missed Time: 46 Minutes Missed Time Reason: Unavailable (comment) (pt in unscheduled HD)    Attempted to see pt for scheduled OT session. Pt in HD d/t being skipped yesterday. 60 min missed.    Therapy/Group: Individual Therapy  Curtis Sites 12/26/2019, 6:52 AM

## 2019-12-26 NOTE — Progress Notes (Signed)
PHYSICAL MEDICINE & REHABILITATION PROGRESS NOTE   Subjective/Complaints: C/o spasms in right leg. Says that muscle relaxant didn't help (robaxin). Wants to be able to take tramadol more frequently for pain. Still itching also  ROS: Patient denies fever, rash, sore throat, blurred vision, nausea, vomiting, diarrhea, cough, shortness of breath or chest pain, joint or back pain, headache, or mood change.   Objective:   No results found. No results for input(s): WBC, HGB, HCT, PLT in the last 72 hours. No results for input(s): NA, K, CL, CO2, GLUCOSE, BUN, CREATININE, CALCIUM in the last 72 hours.  Intake/Output Summary (Last 24 hours) at 12/26/2019 0757 Last data filed at 12/26/2019 0740 Gross per 24 hour  Intake 960 ml  Output --  Net 960 ml     Pressure Injury 12/18/19 Sacrum Medial Stage 1 -  Intact skin with non-blanchable redness of a localized area usually over a bony prominence. Red, non-blanchable spot over sacrum area (Active)  12/18/19 1707  Location: Sacrum  Location Orientation: Medial  Staging: Stage 1 -  Intact skin with non-blanchable redness of a localized area usually over a bony prominence.  Wound Description (Comments): Red, non-blanchable spot over sacrum area  Present on Admission: Yes    Physical Exam: Constitutional: No distress . Vital signs reviewed. HEENT: EOMI, oral membranes moist Neck: supple Cardiovascular: RRR without murmur. No JVD    Respiratory/Chest: CTA Bilaterally without wheezes or rales. Normal effort    GI/Abdomen: BS +, non-tender, non-distended Ext: no clubbing, cyanosis, or edema Psych: pleasant and cooperative Skin:   LUE fistula intact. Left calf wound dressed. mild sacral redness present Neuro: Pt is cognitively appropriate with normal insight, memory, and awareness. Cranial nerves 2-12 are intact. Seems to sense pain and LT in all 4's. Reflexes are 2+ on left. 3+ right. Fine motor coordination is intact. No tremors.  Motor function is grossly 5/5 LUE and LLE.  RUE and RLE 0/5.   Musculoskeletal: minimal right wrist hand/tenderness with palpation/rom.    Vital Signs Blood pressure 127/70, pulse 88, temperature 98.7 F (37.1 C), resp. rate 17, height 5\' 5"  (1.651 m), weight 61.5 kg, last menstrual period 11/15/2008, SpO2 96 %.  Assessment/Plan: 1. Functional deficits which require 3+ hours per day of interdisciplinary therapy in a comprehensive inpatient rehab setting.  Physiatrist is providing close team supervision and 24 hour management of active medical problems listed below.  Physiatrist and rehab team continue to assess barriers to discharge/monitor patient progress toward functional and medical goals  Care Tool:  Bathing    Body parts bathed by patient: Chest,Abdomen,Right upper leg,Left upper leg,Right arm,Face   Body parts bathed by helper: Left arm,Front perineal area,Buttocks,Right lower leg,Left lower leg     Bathing assist Assist Level: Moderate Assistance - Patient 50 - 74%     Upper Body Dressing/Undressing Upper body dressing   What is the patient wearing?: Pull over shirt    Upper body assist Assist Level: Moderate Assistance - Patient 50 - 74%    Lower Body Dressing/Undressing Lower body dressing      What is the patient wearing?: Pants     Lower body assist Assist for lower body dressing: Total Assistance - Patient < 25%     Toileting Toileting    Toileting assist Assist for toileting: Total Assistance - Patient < 25%     Transfers Chair/bed transfer  Transfers assist     Chair/bed transfer assist level: Moderate Assistance - Patient 50 - 74%  Locomotion Ambulation   Ambulation assist      Assist level: 2 helpers Assistive device: Hand held assist Max distance: 30'   Walk 10 feet activity   Assist     Assist level: 2 helpers Assistive device: Hand held assist   Walk 50 feet activity   Assist Walk 50 feet with 2 turns activity did  not occur: Safety/medical concerns         Walk 150 feet activity   Assist Walk 150 feet activity did not occur: Safety/medical concerns         Walk 10 feet on uneven surface  activity   Assist Walk 10 feet on uneven surfaces activity did not occur: Safety/medical concerns         Wheelchair     Assist Will patient use wheelchair at discharge?: Yes Type of Wheelchair: Manual Wheelchair activity did not occur: Safety/medical concerns         Wheelchair 50 feet with 2 turns activity    Assist    Wheelchair 50 feet with 2 turns activity did not occur: Safety/medical concerns       Wheelchair 150 feet activity     Assist  Wheelchair 150 feet activity did not occur: Safety/medical concerns       Blood pressure 127/70, pulse 88, temperature 98.7 F (37.1 C), resp. rate 17, height 5\' 5"  (1.651 m), weight 61.5 kg, last menstrual period 11/15/2008, SpO2 96 %.  Medical Problem List and Plan: 1.  L frontal ICH secondary to recent starting of Coumadin- Coumadin on hold with R hemiplegia             -patient may shower             -ELOS/Goals: 12/31  --Continue CIR therapies including PT, OT, and SLP.    2.  Portal vein thrombosis/A fib/Antithrombotics: -DVT/anticoagulation:  Pharmaceutical: SCDs due to thrombocytopenia and recent bleed.  --per neuro no coumadin-->will need to wait at least one month due to size of bleed.  --dopplers ok             -antiplatelet therapy: N/A 3. Pain Management: tylenol prn.   - kpad for neck pain  -pt to work with pt today, might benefit from massage, K-tape  -will replace robaxin with flexeril prn  -allow tramadol q8 prn  -right sided shoulder pain and ear ache     -resolved 4. Mood: LCSW to follow up for evaluation and support.              -antipsychotic agents: N/A 5. Neuropsych: This patient is capable of making decisions on her own behalf. 6. Wound ulcer/Skin/Wound Care:Pack daily wound care LLE.     -turning, nutrition, education  12/14 -will add scheduled benadryl for itching. Also receiving sarna        -prn vistaril 7. Fluids/Electrolytes/Nutrition: Strict I/O.  12/14: labs with HD TTS 8. ESRD: Continue HD on TTS at the end of the day to help with therapy tolerance.  9. HTN: Monitor BP tid-currently controlled on current regimen 10. Pulmonary HTN: Oxygen dependent. Continue Ambrisentan and Selexipag. On Home dose 2L by [place] 11. Cirrhosis of the liver: Monitor platelets and for any signs of bleeding. Reporting abdominal distension.  (protal vein thrombus)  -f/u abd CT as outpt 12. Chronic systolic CHF: Monitor daily weights as well as I/O. Continue tadalafil.    Filed Weights   12/24/19 0500 12/25/19 0500 12/26/19 0445  Weight: 55.6 kg 60.4 kg 61.5 kg    -  volume mgt per nephro 13. CAF: Monitor HR tid--metoprolol has been on hold. Coumadin to be held due to [place].  14. Pancytopenia:  Continue to monitor with serial checks.   12/14 hgb drop (11.1--> 9.9) d/t nose bleed likely--follow up in HD.   15. Constipation WITH hemorrhoids- seems to cycle between loose stool  -prn miralax only  12/13 pt with mushy,loose stools yet seems to report constipation   -backed of scheduled senna-s for now   -added daily fiber packet  -encouraging regular solid/liquid intake as well  -stopped amitiza 12/14  -resume senna-s at bedtime 12/15 16. Significant epistaxis (pt has prior history)  -resolved, nasal rockets out.   -appreciate Dr. Deeann Saint help  -keflex dc'ed  -maintain nasal spray, keep nose moist 17. Insomnia:discussed increasing dose of trazodone but patient prefers not to at this time. Discussed importance of sleep for health, function, participation in therapy.   12/15 sarna, scheduled benadryl, prn vistaril.  18. 12/12: protonix dced per pt request 19. 12/12: Palpable nodule (LN?) below right lower jaw- not TTP but patient states hurt last night, continue to monitor.  resolved     LOS: 8 days A FACE TO FACE EVALUATION WAS PERFORMED  Katie Nunez 12/26/2019, 7:57 AM

## 2019-12-26 NOTE — Progress Notes (Signed)
   12/26/19 1705  Vitals  Temp 98.3 F (36.8 C)  Temp Source Oral  BP (!) 105/54  BP Location Right Arm  BP Method Automatic  Patient Position (if appropriate) Lying  Pulse Rate 81  Pulse Rate Source Monitor  Resp 16  Oxygen Therapy  SpO2 98 %  O2 Device Nasal Cannula  O2 Flow Rate (L/min) 2 L/min  Dialysis Weight  Weight 54.3 kg  Type of Weight Post-Dialysis  Post-Hemodialysis Assessment  Rinseback Volume (mL) 250 mL  KECN 267 V  Dialyzer Clearance Lightly streaked  Duration of HD Treatment -hour(s) 3 hour(s)  Hemodialysis Intake (mL) 500 mL  UF Total -Machine (mL) 2500 mL  Net UF (mL) 2000 mL  Tolerated HD Treatment Yes  Post-Hemodialysis Comments tx complete-stable  AVG/AVF Arterial Site Held (minutes) 10 minutes  AVG/AVF Venous Site Held (minutes) 10 minutes  Fistula / Graft Left Upper arm Arteriovenous fistula  No placement date or time found.   Placed prior to admission: Yes  Orientation: Left  Access Location: Upper arm  Access Type: Arteriovenous fistula  Site Condition No complications

## 2019-12-26 NOTE — Progress Notes (Addendum)
Patient ID: Katie Nunez, female   DOB: Jan 31, 1955, 64 y.o.   MRN: [id number]  SW sent out SNF referrals. [place] PASRR# pending.   SW went by pt room to provide pt with SNF referral list via Medicare.gov based on zipcode and [place] list: [place], [place]; but pt gone for dialysis. SW left list in room. SW to follow-up with pt to discuss tomorrow.   Loralee Pacas, MSW, [place] Office: [phone] Cell: [phone] Fax: [phone]

## 2019-12-26 NOTE — Progress Notes (Signed)
Subjective:  Seen in room, no c/o's.   Objective Vital signs in last 24 hours: Vitals:   12/26/19 1355 12/26/19 1424 12/26/19 1430 12/26/19 1500  BP: 122/70 125/72 122/67 111/65  Pulse: 78 72 77 78  Resp:      Temp:      TempSrc:      SpO2:      Weight:      Height:       Weight change: 1.1 kg  Physical Exam: General: alert , NAD  Heart: RRR, no mgr  Lungs: CTA Abdomen: bs +, soft , NT, ND Extremities: no pedal  Edema Dialysis Access: LUA AVF + bruit     HD orders: SW TTS  3.5h  450/600   2/2.25 bath 58.5kg  P2  LUA AVF  Hep none -Mircera 50 mcg IV q 4 weeks -Hectorol 6 mcg IV TIW -Sensipar 30 mg PO TIW   Problem/Plan: # ICH-thought secondary to fairly new Coumadin therapy. Coumadinwasheld, reversing agents given,neurologymanaged.Repeat CT of head 12/02 without evidence of extension of hematoma. Now in CIR.  #ESRD:on  TTS schedule. HD today off sched then again tomorrow to get back on schedule.    #Hypertension/Volume- No evidence of volume overload.UF as tol during HD.  Monitor blood pressure.  #Anemia of ESRD:Hgb  9.9 now with hgb<10 restart  Low dose Aranesp. Slight drop in hemoglobin probably because of blood loss from epistaxis, avoiding anticoagulation.  #Secondary hyperparathyroidism: Phosphorus level improved 4.9  Calcium 10.6  corrected   with higher dose of PhosLo.  Continue Sensipar and Hectorol.will hold with ^ ca , use 2.o ca bath  Fu trend   #pulmonary HTN. Follows with [facility]ology at [facility]. Currently on tadalafil and Uptravi for her pulmonary hypertension.Marland Kitchen Avoid hypotension.   #Epistaxis: Seen by ENT, has nasal packing  Kelly Splinter, MD 12/26/2019, 3:15 PM      Labs: Basic Metabolic Panel: Recent Labs  Lab 12/20/19 1358 12/22/19 1325 12/26/19 1430  NA 134* 133* 132*  K 4.1 4.2 4.4  CL 96* 97* 94*  CO2 24 26 21*  GLUCOSE 100* 100* 82  BUN 56* 47* 85*  CREATININE 6.81* 6.15* 9.45*  CALCIUM 9.0 9.6 10.0  PHOS 5.2* 4.9*  6.0*   Liver Function Tests: Recent Labs  Lab 12/20/19 1358 12/22/19 1325 12/26/19 1430  ALBUMIN 2.6* 2.7* 2.8*   No results for input(s): LIPASE, AMYLASE in the last 168 hours. No results for input(s): AMMONIA in the last 168 hours. CBC: Recent Labs  Lab 12/20/19 1038 12/22/19 1325 12/26/19 1429  WBC 3.1* 3.3* 3.0*  HGB 11.1* 9.9* 9.4*  HCT 33.4* 29.3* 28.6*  MCV 90.8 89.1 89.7  PLT 161 130* 174   Cardiac Enzymes: No results for input(s): CKTOTAL, CKMB, CKMBINDEX, TROPONINI in the last 168 hours. CBG: No results for input(s): GLUCAP in the last 168 hours.  Studies/Results: No results found. Medications:  . ambrisentan  5 mg Oral Daily  . azelastine  1 spray Each Nare BID  . calcium acetate  1,334 mg Oral TID WC  . camphor-menthol   Topical BID  . Chlorhexidine Gluconate Cloth  6 each Topical Q0600  . Chlorhexidine Gluconate Cloth  6 each Topical Q0600  . cinacalcet  30 mg Oral Q T,Th,Sa-HD  . diphenhydrAMINE  25 mg Oral Q12H  . doxercalciferol  6 mcg Intravenous Q T,Th,Sa-HD  . feeding supplement (NEPRO CARB STEADY)  237 mL Oral BID BM  . Gerhardt's butt cream   Topical QID  . hydrocerin  Topical BID  . hydrocortisone  25 mg Rectal BID  . loratadine  10 mg Oral Daily  . oxymetazoline  1 spray Each Nare BID  . pentoxifylline  400 mg Oral Daily  . psyllium  1 packet Oral Daily  . Selexipag  800 mcg Oral BID  . senna-docusate  1 tablet Oral QHS  . sodium chloride  1 spray Each Nare 5 X Daily  . tadalafil  20 mg Oral Daily

## 2019-12-26 NOTE — Progress Notes (Signed)
Physical Therapy Session Note  Patient Details  Name: Katie Nunez MRN: [id number] Date of Birth: [birth year]  Today's Date: 12/26/2019 PT Individual Time: 1004-1059 PT Individual Time Calculation (min): 55 min   Short Term Goals: Week 1:  PT Short Term Goal 1 (Week 1): Pt will complete bed mobility with minA PT Short Term Goal 2 (Week 1): Pt will perform bed to chair transfer with minA PT Short Term Goal 3 (Week 1): Pt will perform sit to stand with minA PT Short Term Goal 4 (Week 1): Pt will ambulate x30' with modA +1.  Skilled Therapeutic Interventions/Progress Updates:     Pt received seated in [facility] and agrees to therapy. No complaint of pain. PT provides maxA for threading pants on bilateral lower extremities as well as upper body dressing. Pt stands with maxA as PT assists pulling up pants. WC transport to therapy gym for time management. Pt performs seated scooting practices, with re-ed on head hips relationship. Multimodal cuing provided for body mechanics and modA /maxA for squat pivot to mat table. Mirror positioned for visual feedback. Pt performs NMR for activity tolerance and weight bearing through R hemibody. Pt performs partial sit to stands, with focus on squatting posture for increased WB and weight shifting to the R. Pt cues to placed R hand over R knee with L hand over hand manual pressure, then pt cued extensively on anterior weight shift to successfully clear buttocks. ModA manual facilitation of sit to squat 3x10 with extended seated rest breaks. ModA for squat pivot to the L from mat to WC. Pt left seated in WC with alarm intact, Lap tray in place, and all needs within reach.  Therapy Documentation Precautions:  Precautions Precautions: Fall Precaution Comments: Rt hemiplegia - subluxation Restrictions Weight Bearing Restrictions: No    Therapy/Group: Individual Therapy  Breck Coons, PT, DPT 12/26/2019, 3:55 PM

## 2019-12-26 NOTE — NC FL2 (Signed)
[place] LEVEL OF CARE SCREENING TOOL     IDENTIFICATION  Patient Name: Katie Nunez Birthdate: [birth year] Sex: female Admission Date (Current Location): 12/18/2019  [facility] and [place] Number:  Herbalist and Address:  [facility]. [facility], [place] [address], [place], [place] [address]      Provider Number: [id number]  Attending Physician Name and Address:  Meredith Staggers, MD  Relative Name and Phone Number:  Hanley Seamen (husband)    Current Level of Care: Hospital Recommended Level of Care: [place] Prior Approval Number:    Date Approved/Denied:   PASRR Number:    Discharge Plan: SNF    Current Diagnoses: Patient Active Problem List   Diagnosis Date Noted  . Pressure injury of skin 12/20/2019  . Intraparenchymal hemorrhage of brain ([place]) 12/18/2019  . ICH (intracerebral hemorrhage) ([place]) 12/12/2019  . Viral disease 08/22/2019  . Chronic right-sided heart failure ([place]) 07/09/2019  . Supraventricular tachycardia ([place]) 07/09/2019  . Other cirrhosis of liver ([place]) 06/27/2019  . Heart failure ([place]) 02/21/2019  . Heart palpitations 08/14/2018  . Other fluid overload 07/27/2018  . Encounter for long-term (current) use of other medications 01/18/2018  . Hypothyroidism 01/18/2018  . Myalgia and myositis 01/18/2018  . Chronic nephritis 01/18/2018  . Other interstitial pulmonary diseases with fibrosis in diseases classified elsewhere ([place]) 01/18/2018  . Other long term (current) drug therapy 01/18/2018  . Sleep apnea 01/18/2018  . Unspecified persistent mental disorders due to conditions classified elsewhere 01/18/2018  . Venous reflux 01/18/2018  . Vitamin B12 deficiency 01/18/2018  . Vitamin D deficiency 01/18/2018  . Obstructive lung disease ([place]) 12/30/2017  . Other pruritus 12/27/2017  . Eruption cyst 12/19/2017  . 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 ([place]) 11/28/2017   . Chronic cough 11/11/2017  . Pulmonary hypertension ([place]) 11/11/2017  . Pulmonary arterial hypertension ([place]) 10/07/2017  . Acute ITP ([place]) 01/05/2017  . ESRD (end stage renal disease) ([place]) 12/28/2016  . Encounter for removal of sutures 09/09/2016  . Coagulation defect, unspecified ([place]) 09/01/2016  . Underimmunization status 08/05/2016  . Hemolytic anemia ([place]) 07/26/2016  . Epistaxis, recurrent 07/26/2016  . Unspecified protein-calorie malnutrition ([place]) 07/13/2016  . Aftercare including intermittent dialysis ([place]) 07/07/2016  . Anemia in chronic kidney disease 07/07/2016  . Hypokalemia 07/07/2016  . Iron deficiency anemia, unspecified 07/07/2016  . 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 Nonrheumatic mitral (valve) prolapse 07/07/2016  . Other irritable bowel syndrome 07/07/2016  . Other secondary thrombocytopenia 07/07/2016  . Secondary hyperparathyroidism of renal origin ([place]) 07/07/2016  . Thrombocytopenia ([place]) 07/01/2016  . ARF (acute renal failure) ([place]) 06/25/2016  . Anxiety 06/25/2016  . Bilateral carpal tunnel syndrome 10/22/2015  . Chronic gout without tophus 10/22/2015  . Chronic nonintractable headache 10/08/2015  . Fibroid uterus 01/03/2012  . H/O vitamin D deficiency 01/03/2012  . Post-menopausal 01/03/2012  . Hereditary and idiopathic peripheral neuropathy 11/19/2011  . Intestinal malabsorption 11/19/2011  . Lichen planus 27/07/8673  . Low back pain 11/19/2011  . Diffuse spasm of esophagus 11/11/2011  . ESR raised 11/11/2011  . Postinflammatory pulmonary fibrosis ([place]) 11/11/2011  . Scleroderma ([place]) 11/16/2010  . Rheumatoid arthritis ([place]) 11/16/2010  . Raynaud's disease 11/16/2010  . Symptomatic menopausal or female climacteric states 11/16/2010    Orientation RESPIRATION BLADDER Height & Weight     Self,Time,Situation,Place  O2 (2L [place]) Continent (urgency at times) Weight: 135 lb 9.3 oz (61.5 kg) Height:  '5\' 5"'  (165.1 cm)  BEHAVIORAL  SYMPTOMS/MOOD  NEUROLOGICAL BOWEL NUTRITION STATUS      Continent Diet (renal diet)  AMBULATORY STATUS COMMUNICATION OF NEEDS Skin   Limited Assist Verbally                         Personal Care Assistance Level of Assistance  Bathing,Dressing Bathing Assistance: Limited assistance   Dressing Assistance: Limited assistance     Functional Limitations Info             SPECIAL CARE FACTORS FREQUENCY  OT (By licensed OT),PT (By licensed PT),Speech therapy     PT Frequency: 5xs per week OT Frequency: 5xs per week     Speech Therapy Frequency: 5xs per week      Contractures      Additional Factors Info  Code Status,Allergies Code Status Info: Full Allergies Info: High Anaphylaxis- Do not use polyflux membrane. Use alternate;  Savella (milnacipran Hcl) Low Palpitations; Tape Low Rash: Itch- unsure if it was paper or adhesive tape           Current Medications (12/26/2019):  This is the current hospital active medication list Current Facility-Administered Medications  Medication Dose Route Frequency Provider Last Rate Last Admin  . acetaminophen (TYLENOL) tablet 325-650 mg  325-650 mg Oral Q4H PRN Bary Leriche, PA-C   650 mg at 12/26/19 0701  . ambrisentan (LETAIRIS) tablet 5 mg  5 mg Oral Daily Bary Leriche, PA-C   5 mg at 12/26/19 0851  . azelastine (ASTELIN) 0.1 % nasal spray 1 spray  1 spray Each Nare BID Bary Leriche, PA-C   1 spray at 12/26/19 0239  . bisacodyl (DULCOLAX) suppository 10 mg  10 mg Rectal Daily PRN Love, Pamela S, PA-C      . calcium acetate (PHOSLO) capsule 1,334 mg  1,334 mg Oral TID WC LoveIvan Anchors, PA-C   1,334 mg at 12/26/19 1240  . camphor-menthol (SARNA) lotion   Topical BID Meredith Staggers, MD   Given at 12/25/19 2154  . Chlorhexidine Gluconate Cloth 2 % PADS 6 each  6 each Topical Q0600 Rosita Fire, MD   6 each at 12/21/19 [phone]  . Chlorhexidine Gluconate Cloth 2 % PADS 6 each  6 each Topical Q0600 Rosita Fire, MD   6 each at  12/21/19 1030  . cinacalcet (SENSIPAR) tablet 30 mg  30 mg Oral Q T,Th,Sa-HD Bary Leriche, PA-C   30 mg at 12/22/19 1633  . clonazePAM (KLONOPIN) tablet 0.5 mg  0.5 mg Oral BID PRN Bary Leriche, PA-C   0.5 mg at 12/24/19 2058  . cyclobenzaprine (FLEXERIL) tablet 5 mg  5 mg Oral TID PRN Meredith Staggers, MD      . diphenhydrAMINE (BENADRYL) capsule 25 mg  25 mg Oral Q12H Meredith Staggers, MD   25 mg at 12/26/19 0850  . doxercalciferol (HECTOROL) injection 6 mcg  6 mcg Intravenous Q T,Th,Sa-HD Bary Leriche, PA-C   6 mcg at 12/22/19 1634  . famotidine (PEPCID) tablet 20 mg  20 mg Oral Daily PRN Love, Pamela S, PA-C      . feeding supplement (NEPRO CARB STEADY) liquid 237 mL  237 mL Oral BID BM Love, Ivan Anchors, PA-C   237 mL at 12/26/19 1239  . Gerhardt's butt cream   Topical QID Bary Leriche, [place]   Given at 12/25/19 2152  . guaiFENesin-dextromethorphan (ROBITUSSIN DM) 100-10 MG/5ML syrup 5-10 mL  5-10 mL Oral Q6H  PRN Love, Ivan Anchors, PA-C      . hydrocerin (EUCERIN) cream   Topical BID Bary Leriche, [place]   Given at 12/25/19 2152  . hydrocortisone (ANUSOL-HC) 2.5 % rectal cream   Rectal BID PRN Love, Pamela S, PA-C      . hydrocortisone (ANUSOL-HC) suppository 25 mg  25 mg Rectal BID Reesa Chew S, PA-C   25 mg at 12/26/19 1240  . hydrOXYzine (ATARAX/VISTARIL) tablet 25 mg  25 mg Oral Q8H PRN Meredith Staggers, MD   25 mg at 12/25/19 2150  . loratadine (CLARITIN) tablet 10 mg  10 mg Oral Daily Bary Leriche, PA-C   10 mg at 12/26/19 0851  . milk and molasses enema  1 enema Rectal Daily PRN Love, Pamela S, PA-C      . oxymetazoline (AFRIN) 0.05 % nasal spray 1 spray  1 spray Each Nare BID Cathlyn Parsons, PA-C   1 spray at 12/21/19 1634  . pentoxifylline (TRENTAL) CR tablet 400 mg  400 mg Oral Daily Bary Leriche, PA-C   400 mg at 12/26/19 8616  . polyethylene glycol (MIRALAX / GLYCOLAX) packet 17 g  17 g Oral Daily PRN Meredith Staggers, MD      . prochlorperazine (COMPAZINE) tablet  5-10 mg  5-10 mg Oral Q6H PRN Love, Pamela S, PA-C       Or  . prochlorperazine (COMPAZINE) injection 5-10 mg  5-10 mg Intramuscular Q6H PRN Love, Pamela S, PA-C       Or  . prochlorperazine (COMPAZINE) suppository 12.5 mg  12.5 mg Rectal Q6H PRN Love, Pamela S, PA-C      . pseudoephedrine (SUDAFED) tablet 30 mg  30 mg Oral Q4H PRN Meredith Staggers, MD   30 mg at 12/23/19 2119  . psyllium (HYDROCIL/METAMUCIL) 1 packet  1 packet Oral Daily Meredith Staggers, MD   1 packet at 12/26/19 1239  . Selexipag TABS 800 mcg  800 mcg Oral BID Bary Leriche, PA-C   800 mcg at 12/26/19 1241  . senna-docusate (Senokot-S) tablet 1 tablet  1 tablet Oral QHS Meredith Staggers, MD      . simethicone [facility]) chewable tablet 80 mg  80 mg Oral QID PRN Love, Pamela S, PA-C      . sodium chloride (OCEAN) 0.65 % nasal spray 1 spray  1 spray Each Nare 5 X Daily Meredith Staggers, MD   1 spray at 12/26/19 [phone]  . tadalafil (CIALIS) tablet 20 mg  20 mg Oral Daily Bary Leriche, PA-C   20 mg at 12/26/19 0851  . traMADol (ULTRAM) tablet 25 mg  25 mg Oral Q8H PRN Meredith Staggers, MD   25 mg at 12/26/19 1241  . traZODone (DESYREL) tablet 25 mg  25 mg Oral QHS PRN Raulkar, Clide Deutscher, MD         Discharge Medications: Please see discharge summary for a list of discharge medications.  Relevant Imaging Results:  Relevant Lab Results:   Additional Information [id number]  Rana Snare, LCSW

## 2019-12-27 ENCOUNTER — Inpatient Hospital Stay (HOSPITAL_COMMUNITY): Payer: Medicare Other | Admitting: Speech Pathology

## 2019-12-27 ENCOUNTER — Inpatient Hospital Stay (HOSPITAL_COMMUNITY): Payer: Medicare Other

## 2019-12-27 ENCOUNTER — Inpatient Hospital Stay (HOSPITAL_COMMUNITY): Payer: Medicare Other | Admitting: Occupational Therapy

## 2019-12-27 LAB — CBC WITH DIFFERENTIAL/PLATELET
Abs Immature Granulocytes: 0.02 10*3/uL (ref 0.00–0.07)
Basophils Absolute: 0 10*3/uL (ref 0.0–0.1)
Basophils Relative: 1 %
Eosinophils Absolute: 0.1 10*3/uL (ref 0.0–0.5)
Eosinophils Relative: 2 %
HCT: 20.6 % — ABNORMAL LOW (ref 36.0–46.0)
Hemoglobin: 7 g/dL — ABNORMAL LOW (ref 12.0–15.0)
Immature Granulocytes: 1 %
Lymphocytes Relative: 17 %
Lymphs Abs: 0.6 10*3/uL — ABNORMAL LOW (ref 0.7–4.0)
MCH: 30.3 pg (ref 26.0–34.0)
MCHC: 34 g/dL (ref 30.0–36.0)
MCV: 89.2 fL (ref 80.0–100.0)
Monocytes Absolute: 0.5 10*3/uL (ref 0.1–1.0)
Monocytes Relative: 16 %
Neutro Abs: 2.1 10*3/uL (ref 1.7–7.7)
Neutrophils Relative %: 63 %
Platelets: ADEQUATE 10*3/uL (ref 150–400)
RBC: 2.31 MIL/uL — ABNORMAL LOW (ref 3.87–5.11)
RDW: 16.4 % — ABNORMAL HIGH (ref 11.5–15.5)
WBC: 3.3 10*3/uL — ABNORMAL LOW (ref 4.0–10.5)
nRBC: 0 % (ref 0.0–0.2)

## 2019-12-27 LAB — PROTIME-INR
INR: 1.1 (ref 0.8–1.2)
Prothrombin Time: 13.7 seconds (ref 11.4–15.2)

## 2019-12-27 MED ORDER — DOXERCALCIFEROL 4 MCG/2ML IV SOLN
INTRAVENOUS | Status: AC
  Filled 2019-12-27: qty 4

## 2019-12-27 MED ORDER — CINACALCET HCL 30 MG PO TABS
ORAL_TABLET | ORAL | Status: AC
  Filled 2019-12-27: qty 1

## 2019-12-27 MED ORDER — CEPHALEXIN 250 MG PO CAPS
500.0000 mg | ORAL_CAPSULE | ORAL | Status: DC
Start: 2019-12-27 — End: 2019-12-31
  Administered 2019-12-27 – 2019-12-30 (×4): 500 mg via ORAL
  Filled 2019-12-27 (×5): qty 2

## 2019-12-27 MED ORDER — CEPHALEXIN 250 MG PO CAPS: 500.0000 mg | ORAL_CAPSULE | Freq: Two times a day (BID) | ORAL | Status: DC

## 2019-12-27 NOTE — Progress Notes (Signed)
Subjective: More epistaxis this morning. Bleeding was from the right side. Bleeding was controlled with packing.  Objective: Vital signs in last 24 hours: Temp:  [97.9 F (36.6 C)-98.8 F (37.1 C)] 98.8 F (37.1 C) (12/16 0553) Pulse Rate:  [55-102] 87 (12/16 0553) Resp:  [16-20] 16 (12/16 0553) BP: (97-129)/(52-72) 129/69 (12/16 0553) SpO2:  [90 %-98 %] 90 % (12/16 0553) Weight:  [54.3 kg-56.3 kg] 55 kg (12/16 0553)  Physical Exam: General appearance:alert, cooperative and no distress Head:Normocephalic, without obvious abnormality, atraumatic Eyes: Pupils are equal, round, reactive to light. Extraocular motion is intact.  Ears: Examination of the ears shows normal auricles and external auditory canals bilaterally.  Nose:Rhinorocket packing inrightNC.No bleeding is noted. Face: Facial examination shows no asymmetry. Palpation of the face elicit no significant tenderness.  Mouth: Oral cavity examination shows no mucosal lacerations. No significant trismus is noted.  Neck: Palpation of the neck reveals no lymphadenopathy or mass. The trachea is midline.   Recent Labs    12/26/19 1429 12/27/19 0858  WBC 3.0* 3.3*  HGB 9.4* 7.0*  HCT 28.6* 20.6*  PLT 174 PLATELET CLUMPS NOTED ON SMEAR, COUNT APPEARS ADEQUATE   Recent Labs    12/26/19 1430  NA 132*  K 4.4  CL 94*  CO2 21*  GLUCOSE 82  BUN 85*  CREATININE 9.45*  CALCIUM 10.0    Medications:  I have reviewed the patient's current medications. Scheduled: . ambrisentan  5 mg Oral Daily  . azelastine  1 spray Each Nare BID  . calcium acetate  1,334 mg Oral TID WC  . camphor-menthol   Topical BID  . cephALEXin  500 mg Oral Q24H  . Chlorhexidine Gluconate Cloth  6 each Topical Q0600  . Chlorhexidine Gluconate Cloth  6 each Topical Q0600  . cinacalcet  30 mg Oral Q T,Th,Sa-HD  . diphenhydrAMINE  25 mg Oral Q12H  . doxercalciferol  6 mcg Intravenous Q T,Th,Sa-HD  . feeding supplement (NEPRO CARB STEADY)  237 mL  Oral BID BM  . Gerhardt's butt cream   Topical QID  . hydrocerin   Topical BID  . hydrocortisone  25 mg Rectal BID  . loratadine  10 mg Oral Daily  . oxymetazoline  1 spray Each Nare BID  . pentoxifylline  400 mg Oral Daily  . psyllium  1 packet Oral Daily  . Selexipag  800 mcg Oral BID  . senna-docusate  1 tablet Oral QHS  . sodium chloride  1 spray Each Nare 5 X Daily  . tadalafil  20 mg Oral Daily   Continuous:   Assessment/Plan: Recurrent epistaxis.  - Bleeding now stopped with right nasal packing. - In light of her recurrent bleeding, she may benefit from endoscopic cauterization of her bleeding sources in the operating room. - Will likely schedule the procedure this weekend.   LOS: 9 days   Zya Finkle W Elim Peale 12/27/2019, 12:17 PM

## 2019-12-27 NOTE — Progress Notes (Signed)
Patient ID: Katie Nunez, female   DOB: [birth year], 64 y.o.   MRN: [id number]  Pt PASRR escalated to level II.   Loralee Pacas, MSW, [place] Office: [phone] Cell: [phone] Fax: [phone]

## 2019-12-27 NOTE — Progress Notes (Signed)
PHARMACY NOTE:  ANTIMICROBIAL RENAL DOSAGE ADJUSTMENT  Current antimicrobial regimen includes a mismatch between antimicrobial dosage and estimated renal function.  As per policy approved by the Pharmacy & Therapeutics and Medical Executive Committees, the antimicrobial dosage will be adjusted accordingly.  Current antimicrobial dosage:  Cephalexin 500 mg PO q12h  Indication: not specified  Renal Function:  Estimated Creatinine Clearance: 5.4 mL/min (A) (by C-G formula based on SCr of 9.45 mg/dL (H)). [x]      On intermittent HD, scheduled: []      On CRRT    Antimicrobial dosage has been changed to:  Cephalexin 500mg  PO q24h  Additional comments:   Sir Mallis A. Levada Dy, PharmD, BCPS, FNKF Clinical Pharmacist [place] Please utilize Amion for appropriate phone number to reach the unit pharmacist ([place])   12/27/2019 8:39 AM

## 2019-12-27 NOTE — Progress Notes (Signed)
Occupational Therapy Weekly Progress Note  Patient Details  Name: Katie Nunez MRN: [id number] Date of Birth: Dec 28, 1955  Beginning of progress report period: December 19, 2019 End of progress report period: December 27, 2019  Today's Date: 12/27/2019 OT Individual Time: 6629-4765 OT Individual Time Calculation (min): 43 min    Patient has met 2 of 4 short term goals.  Patient is making slow, but steady progress towards OT goals. Pt is at an overall mod A level for functional transfers and BADL tasks. She has had some set backs with chronic nose bleeds that have limited her participation in therapy. Patient is working hard though when she is in her therapy session.   Patient continues to demonstrate the following deficits: muscle weakness, abnormal tone, unbalanced muscle activation and decreased coordination, decreased attention to right, decreased safety awareness and decreased sitting balance, decreased standing balance, decreased postural control, hemiplegia and decreased balance strategies and therefore will continue to benefit from skilled OT intervention to enhance overall performance with BADL and Reduce care partner burden.  Patient progressing toward long term goals..  Continue plan of care.  OT Short Term Goals Week 1:  OT Short Term Goal 1 (Week 1): Pt will complete a stand pivot to toilet with mod A of 1. OT Short Term Goal 1 - Progress (Week 1): Met OT Short Term Goal 2 (Week 1): Pt will stand at toilet with mod A of 1. OT Short Term Goal 2 - Progress (Week 1): Progressing toward goal OT Short Term Goal 3 (Week 1): Pt will don shirt with mod A. OT Short Term Goal 3 - Progress (Week 1): Met OT Short Term Goal 4 (Week 1): Pt will don pants over feet with mod A. OT Short Term Goal 4 - Progress (Week 1): Progressing toward goal Week 2:  OT Short Term Goal 1 (Week 2): Pt will don pants over feet with mod A. OT Short Term Goal 2 (Week 2): Pt will stand at toilet with mod A of  1. OT Short Term Goal 3 (Week 2): Patient will reposition R UE prior to transfer with min questioning cues OT Short Term Goal 4 (Week 2): Pt will complete self ROM with min cues  Skilled Therapeutic Interventions/Progress Updates:    Patient greeted semi-reclined in bed and agreeable to OT treatment session. Pt reported need to go to the bathroom. Pt completed bed mobility with max A of 1. Stand-pivots bed>wc>BSC over toilet with mod/max A and verbal/tactile cues for weight shifting and pivot. Pt with continent BM and worked on toileting strategies using leaning and hip hike with mod A for sitting balance. Worked on dressing strategies from [facility] with max A for LB dressing and mod A for UB dressing. Pt began having nose bleed after transfer back to wc. Nursing notified and Nursing and PA in room. OT transferred pt back to bed with max A. Pt left semi-reclined in bed with needs met and nursing present.   Therapy Documentation Precautions:  Precautions Precautions: Fall Precaution Comments: Rt hemiplegia - subluxation Restrictions Weight Bearing Restrictions: No Pain: Denies pain  Therapy/Group: Individual Therapy  Valma Cava 12/27/2019, 8:23 AM

## 2019-12-27 NOTE — Progress Notes (Signed)
Speech Language Pathology Daily Session Note  Patient Details  Name: MARYROSE COLVIN MRN: [id number] Date of Birth: 1955/02/09  Today's Date: 12/27/2019 SLP Individual Time: 0930-1000 SLP Individual Time Calculation (min): 30 min  Short Term Goals: Week 2: SLP Short Term Goal 1 (Week 2): Patient will demonstrate complex problem solving for functional and familiar tasks withMod I. SLP Short Term Goal 2 (Week 2): Patient will recall new, daily information with Supervision verbal cues for use of memory compensatory strategies. SLP Short Term Goal 3 (Week 2): Patient will demonstrate selective attention to tasks in a mildly distracting enviornment for 45 minutes with Supervision verbal cues for redirection.  Skilled Therapeutic Interventions: Skilled treatment session focused on cognitive goals. Upon arrival, patient was mildly distracted due to having a nose bleed with fear of needing right nasal packing. SLP facilitated session by providing overall total A for navigating her phone in regards to moving apps for memory compensatory strategies into a specific folder for ease of access. During next session, focus should be on locating and utilization of strategies for functional tasks. Patient left upright in bed with PA present. Continue with current plan of care.      Pain No/Denies Pain   Therapy/Group: Individual Therapy  Ashtian Villacis 12/27/2019, 2:30 PM

## 2019-12-27 NOTE — Progress Notes (Signed)
[facility] PHYSICAL MEDICINE & REHABILITATION PROGRESS NOTE   Subjective/Complaints: Pt developed more epistaxis this morning. Not as severe as the other day, mostly from right nare. Has been recurring off and on. Had somewhat stopped when I was in the room just now. Pt doesn't want nasal ocket.   Objective:   No results found. Recent Labs    12/26/19 1429  WBC 3.0*  HGB 9.4*  HCT 28.6*  PLT 174   Recent Labs    12/26/19 1430  NA 132*  K 4.4  CL 94*  CO2 21*  GLUCOSE 82  BUN 85*  CREATININE 9.45*  CALCIUM 10.0    Intake/Output Summary (Last 24 hours) at 12/27/2019 0910 Last data filed at 12/26/2019 2127 Gross per 24 hour  Intake 540 ml  Output 2000 ml  Net -1460 ml     Pressure Injury 12/18/19 Sacrum Medial Stage 1 -  Intact skin with non-blanchable redness of a localized area usually over a bony prominence. Red, non-blanchable spot over sacrum area (Active)  12/18/19 1707  Location: Sacrum  Location Orientation: Medial  Staging: Stage 1 -  Intact skin with non-blanchable redness of a localized area usually over a bony prominence.  Wound Description (Comments): Red, non-blanchable spot over sacrum area  Present on Admission: Yes    Physical Exam: Constitutional: No distress . Vital signs reviewed. HEENT: EOMI, oral membranes moist, dry blood in right nose, large clot with blood in towel she's holding. Neck: supple Cardiovascular: RRR without murmur. No JVD    Respiratory/Chest: CTA Bilaterally without wheezes or rales. Normal effort    GI/Abdomen: BS +, non-tender, non-distended Ext: no clubbing, cyanosis, or edema Psych: pleasant and cooperative Skin:   LUE fistula intact. Left calf wound dressed. mild sacral redness present Neuro: Pt is cognitively appropriate with normal insight, memory, and awareness. Cranial nerves 2-12 are intact. Seems to sense pain and LT in all 4's. Reflexes are 2+ on left. 3+ right. Fine motor coordination is intact. No tremors.  Motor function is grossly 5/5 LUE and LLE.  RUE and RLE 0/5.   Musculoskeletal: minimal jt tenderness    Vital Signs Blood pressure 129/69, pulse 87, temperature 98.8 F (37.1 C), resp. rate 16, height 5\' 5"  (1.651 m), weight 55 kg, last menstrual period 11/15/2008, SpO2 90 %.  Assessment/Plan: 1. Functional deficits which require 3+ hours per day of interdisciplinary therapy in a comprehensive inpatient rehab setting.  Physiatrist is providing close team supervision and 24 hour management of active medical problems listed below.  Physiatrist and rehab team continue to assess barriers to discharge/monitor patient progress toward functional and medical goals  Care Tool:  Bathing    Body parts bathed by patient: Chest,Abdomen,Right upper leg,Left upper leg,Right arm,Face   Body parts bathed by helper: Left arm,Front perineal area,Buttocks,Right lower leg,Left lower leg     Bathing assist Assist Level: Moderate Assistance - Patient 50 - 74%     Upper Body Dressing/Undressing Upper body dressing   What is the patient wearing?: Pull over shirt    Upper body assist Assist Level: Moderate Assistance - Patient 50 - 74%    Lower Body Dressing/Undressing Lower body dressing      What is the patient wearing?: Pants     Lower body assist Assist for lower body dressing: Total Assistance - Patient < 25%     Toileting Toileting    Toileting assist Assist for toileting: Total Assistance - Patient < 25%     Transfers Chair/bed transfer  Transfers assist     Chair/bed transfer assist level: Moderate Assistance - Patient 50 - 74%     Locomotion Ambulation   Ambulation assist      Assist level: 2 helpers Assistive device: Hand held assist Max distance: 30'   Walk 10 feet activity   Assist     Assist level: 2 helpers Assistive device: Hand held assist   Walk 50 feet activity   Assist Walk 50 feet with 2 turns activity did not occur: Safety/medical  concerns         Walk 150 feet activity   Assist Walk 150 feet activity did not occur: Safety/medical concerns         Walk 10 feet on uneven surface  activity   Assist Walk 10 feet on uneven surfaces activity did not occur: Safety/medical concerns         Wheelchair     Assist Will patient use wheelchair at discharge?: Yes Type of Wheelchair: Manual Wheelchair activity did not occur: Safety/medical concerns         Wheelchair 50 feet with 2 turns activity    Assist    Wheelchair 50 feet with 2 turns activity did not occur: Safety/medical concerns       Wheelchair 150 feet activity     Assist  Wheelchair 150 feet activity did not occur: Safety/medical concerns       Blood pressure 129/69, pulse 87, temperature 98.8 F (37.1 C), resp. rate 16, height 5\' 5"  (1.651 m), weight 55 kg, last menstrual period 11/15/2008, SpO2 90 %.  Medical Problem List and Plan: 1.  L frontal ICH secondary to recent starting of Coumadin- Coumadin on hold with R hemiplegia             -patient may shower             -ELOS/Goals: 12/31  --Continue CIR therapies including PT, OT, and SLP.    2.  Portal vein thrombosis/A fib/Antithrombotics: -DVT/anticoagulation:  Pharmaceutical: SCDs due to thrombocytopenia and recent bleed.  --per neuro no coumadin-->will need to wait at least one month due to size of bleed.  --dopplers ok             -antiplatelet therapy: N/A 3. Pain Management: tylenol prn.   - kpad for neck pain  -pt to work with pt today, might benefit from massage, K-tape  -will replace robaxin with flexeril prn  -may have tramadol q8 prn  -right sided shoulder pain and ear ache     -resolved 4. Mood: LCSW to follow up for evaluation and support.              -antipsychotic agents: N/A 5. Neuropsych: This patient is capable of making decisions on her own behalf. 6. Wound ulcer/Skin/Wound Care:Pack daily wound care LLE.    -turning, nutrition,  education  12/14 -added scheduled benadryl for itching. Also receiving sarna        -prn vistaril 7. Fluids/Electrolytes/Nutrition: Strict I/O.  12/14: labs with HD TTS 8. ESRD: Continue HD on TTS at the end of the day to help with therapy tolerance.  9. HTN: Monitor BP tid-currently controlled on current regimen 10. Pulmonary HTN: Oxygen dependent. Continue Ambrisentan and Selexipag. On Home dose 2L by [place] 11. Cirrhosis of the liver: Monitor platelets and for any signs of bleeding. Reporting abdominal distension.  (protal vein thrombus)  -f/u abd CT as outpt 12. Chronic systolic CHF: Monitor daily weights as well as I/O. Continue tadalafil.  Filed Weights   12/26/19 1350 12/26/19 1705 12/27/19 0553  Weight: 56.3 kg 54.3 kg 55 kg    -volume mgt per nephro 13. CAF: Monitor HR tid--metoprolol has been on hold. Coumadin to be held due to [place].  14. Pancytopenia:  Continue to monitor with serial checks.   12/14 hgb drop (11.1--> 9.4) d/t nose bleed likely as well as ACD--following serially   15. Constipation WITH hemorrhoids- seems to cycle between loose stool  -prn miralax only  12/13 pt with mushy,loose stools yet seems to report constipation   -backed of scheduled senna-s for now   -added daily fiber packet  -encouraging regular solid/liquid intake as well  -stopped amitiza 12/14  -resumed senna-s at bedtime 12/15 16. Significant epistaxis (pt has prior history)  12/16 -recurrent today in right nare.   -ENT recs replacing rocket. Pt doesn't want  -I'm willing to see if it continues to abate on its own, but if it recur, rocket needs to go in. Continue pressure/obs for now  -may need cautery ultimately 17. Insomnia:discussed increasing dose of trazodone but patient prefers not to at this time. Discussed importance of sleep for health, function, participation in therapy.   12/15 sarna, scheduled benadryl, prn vistaril.  18. Right neck nodule appears resolved. Likely node    LOS: 9  days A FACE TO FACE EVALUATION WAS PERFORMED  Meredith Staggers 12/27/2019, 9:10 AM

## 2019-12-27 NOTE — Progress Notes (Signed)
Pt noted with nose bleed mostly from right nares while working with OT around 0830. Pam, PA notified. Rhino socket inserted into right nares. Pt currently resting in bed with family at bedside. No visible bleeding at this time.  Gerald Stabs, RN

## 2019-12-27 NOTE — Progress Notes (Signed)
Physical Therapy Weekly Progress Note  Patient Details  Name: Katie Nunez MRN: [id number] Date of Birth: Oct 23, 1955  Beginning of progress report period: December 19, 2019 End of progress report period: December 27, 2019  Today's Date: 12/27/2019 PT Individual Time: 1102-1159 PT Individual Time Calculation (min): 57 min  and Today's Date: 12/27/2019 PT Missed Time: 60 Minutes Missed Time Reason: MD hold (Comment) (nosebleed)  Patient has met 0 of 4 short term goals.  Pt is progressing toward mobility goals but has not met any short term goals due to absence of motor return in R hemibody. Pt is still requiring modA for bed mobility, modA for sit to stand, and mod to maxA for squat pivot transfer. Pt also has not been able to meet ambulation goals secondary to hemiparesis. Pt has improved sitting balance, however, and is continuing to improve activity tolerance and working on WB through R hemibody to encourage NMR and motor return.   Patient continues to demonstrate the following deficits muscle weakness, decreased cardiorespiratoy endurance,   and decreased sitting balance, decreased standing balance, decreased postural control, hemiplegia and decreased balance strategies and therefore will continue to benefit from skilled PT intervention to increase functional independence with mobility.  Patient progressing toward long term goals..  Continue plan of care.  PT Short Term Goals Week 1:  PT Short Term Goal 1 (Week 1): Pt will complete bed mobility with minA PT Short Term Goal 1 - Progress (Week 1): Progressing toward goal PT Short Term Goal 2 (Week 1): Pt will perform bed to chair transfer with minA PT Short Term Goal 2 - Progress (Week 1): Progressing toward goal PT Short Term Goal 3 (Week 1): Pt will perform sit to stand with minA PT Short Term Goal 3 - Progress (Week 1): Progressing toward goal PT Short Term Goal 4 (Week 1): Pt will ambulate x30' with modA +1. PT Short Term Goal 4 -  Progress (Week 1): Progressing toward goal Week 2:  PT Short Term Goal 1 (Week 2): Pt will perform bed mobility with minA. PT Short Term Goal 2 (Week 2): Pt will perform sit to stand with minA. PT Short Term Goal 3 (Week 2): Pt will perform bed to chair transfer with minA. PT Short Term Goal 4 (Week 2): Pt will ambulates 58' with modA.  Skilled Therapeutic Interventions/Progress Updates:  Ambulation/gait training;Cognitive remediation/compensation;Discharge planning;DME/adaptive equipment instruction;Functional mobility training;Pain management;Psychosocial support;Splinting/orthotics;Therapeutic Activities;UE/LE Strength taining/ROM;Visual/perceptual remediation/compensation;Balance/vestibular training;Community reintegration;Disease management/prevention;Functional electrical stimulation;Neuromuscular re-education;Skin care/wound management;Patient/family education;Stair training;Therapeutic Exercise;UE/LE Coordination activities;Wheelchair propulsion/positioning   Pt misses first scheduled session due to nosebleed and deferred by PA.  2nd Session: Pt received supine in bed and agrees to therapy. Reports soreness pain in R arm and R leg. Number not provided. PT provides repositioning and rest breaks to manage pain symptoms. Supine to sit with modA. Sit to stand and stand pivot transfer to [facility] with modA. WC transport to therapy gym for time management. Focus of session intended to be gait training with litegait over treadmill. Pt requires modA +2 to don harness in standing and +2 assist to step up onto treadmill. Once positioned on treadmill, litegait battery apparently runs out so minimal bodyweight support able to be utilized. PT able to tighten straps to provide some support and use harness as fall prevention, however. PT wraps pt's R leg with ace wrap to promote dorsiflexion and eversion. Pt attempts ambulation on treadmill several times but demonstrates significant increase in R lower extremity tone  once belt begins to  move. PT unable to manually assist R leg in swing phase due to increase in tone so pt only able to walk 9' in 1:00 at 0.1 mph. Pt takes seated rest break. Then uses litegait as standing harness to perform lateral weight shifting to promote increased WB through R hemibody, with PT positioning pt's hand on hand rail and blocking pt's R knee. Pt assisted off treadmill and harness removed with modA +2. WC transport back to room. Pt left seated in WC with all needs within reach.  Therapy Documentation Precautions:  Precautions Precautions: Fall Precaution Comments: Rt hemiplegia - subluxation Restrictions Weight Bearing Restrictions: No   Therapy/Group: Individual Therapy  Breck Coons, PT, DPT 12/27/2019, 12:11 PM

## 2019-12-27 NOTE — Progress Notes (Signed)
Patient developed recurrent nose bleed around 5:30 am--this time from right nares. Was able to control with applied pressure and afrin briefly. Dr. Benjamine Mola contacted (at surgical center) and recommended placing rhino rocket for now. He will be by to evaluated patient once out of surgery. Relayed to patient who started begging and almost in tears due to discomfort from rocket from the past. Ice and pressure applied for another 10 minutes but it started bleeding again after 5 minutes. Patient still pleading not to put in rockets. Pressure applied by tech for another 10 minutes with resolution. Will continue to monitor.

## 2019-12-28 ENCOUNTER — Inpatient Hospital Stay (HOSPITAL_COMMUNITY): Payer: Medicare Other

## 2019-12-28 ENCOUNTER — Inpatient Hospital Stay (HOSPITAL_COMMUNITY): Payer: Medicare Other | Admitting: Occupational Therapy

## 2019-12-28 LAB — HEMOGLOBIN AND HEMATOCRIT, BLOOD
HCT: 30.1 % — ABNORMAL LOW (ref 36.0–46.0)
Hemoglobin: 9.8 g/dL — ABNORMAL LOW (ref 12.0–15.0)

## 2019-12-28 MED ORDER — TRAMADOL HCL 50 MG PO TABS
50.0000 mg | ORAL_TABLET | Freq: Three times a day (TID) | ORAL | Status: DC | PRN
  Administered 2019-12-28 – 2020-01-07 (×7): 50 mg via ORAL
  Filled 2019-12-28 (×7): qty 1

## 2019-12-28 MED ORDER — PSYLLIUM 95 % PO PACK
1.0000 | PACK | Freq: Two times a day (BID) | ORAL | Status: DC
  Administered 2019-12-29 – 2020-01-23 (×20): 1 via ORAL
  Filled 2019-12-28 (×53): qty 1

## 2019-12-28 MED ORDER — SENNOSIDES-DOCUSATE SODIUM 8.6-50 MG PO TABS
1.0000 | ORAL_TABLET | Freq: Every evening | ORAL | Status: DC | PRN
Start: 2019-12-28 — End: 2020-01-04
  Administered 2020-01-03 (×2): 1 via ORAL
  Filled 2019-12-28 (×2): qty 1

## 2019-12-28 NOTE — Progress Notes (Signed)
[facility] PHYSICAL MEDICINE & REHABILITATION PROGRESS NOTE   Subjective/Complaints: Unhappy about rhino rocket but understands it needs to stay in. Didn't sleep well because of rocket and d/t just feeling restless. Has noticed some return in her right leg!  ROS: Patient denies fever, rash, sore throat, blurred vision, nausea, vomiting, diarrhea, cough, shortness of breath or chest pain, headache, or mood change.    Objective:   No results found. Recent Labs    12/26/19 1429 12/27/19 0858  WBC 3.0* 3.3*  HGB 9.4* 7.0*  HCT 28.6* 20.6*  PLT 174 PLATELET CLUMPS NOTED ON SMEAR, COUNT APPEARS ADEQUATE   Recent Labs    12/26/19 1430  NA 132*  K 4.4  CL 94*  CO2 21*  GLUCOSE 82  BUN 85*  CREATININE 9.45*  CALCIUM 10.0    Intake/Output Summary (Last 24 hours) at 12/28/2019 1027 Last data filed at 12/28/2019 0900 Gross per 24 hour  Intake 360 ml  Output 1000 ml  Net -640 ml     Pressure Injury 12/18/19 Sacrum Medial Stage 1 -  Intact skin with non-blanchable redness of a localized area usually over a bony prominence. Red, non-blanchable spot over sacrum area (Active)  12/18/19 1707  Location: Sacrum  Location Orientation: Medial  Staging: Stage 1 -  Intact skin with non-blanchable redness of a localized area usually over a bony prominence.  Wound Description (Comments): Red, non-blanchable spot over sacrum area  Present on Admission: Yes    Physical Exam: Constitutional: No distress . Vital signs reviewed. HEENT: EOMI, oral membranes moist, right nare packed Neck: supple Cardiovascular: RRR without murmur. No JVD    Respiratory/Chest: CTA Bilaterally without wheezes or rales. Normal effort    GI/Abdomen: BS +, non-tender, non-distended Ext: no clubbing, cyanosis, or edema Psych: pleasant and cooperative Skin:   LUE fistula intact. Left calf wound dressed. mild sacral redness present Neuro: Pt is cognitively appropriate with normal insight, memory, and awareness.  Cranial nerves 2-12 are intact. Seems to sense pain and LT in all 4's. Reflexes are 2+ on left. 3+ right. Fine motor coordination is intact. No tremors. Motor function is grossly 5/5 LUE and LLE.  RUE  0/5.  RLE tr-1 Hamstrings and HAD, otherwise 0/5 Musculoskeletal: minimal jt tenderness today   Vital Signs Blood pressure 118/61, pulse 89, temperature 98.4 F (36.9 C), resp. rate (!) 21, height 5\' 5"  (1.651 m), weight 56.7 kg, last menstrual period 11/15/2008, SpO2 96 %.  Assessment/Plan: 1. Functional deficits which require 3+ hours per day of interdisciplinary therapy in a comprehensive inpatient rehab setting.  Physiatrist is providing close team supervision and 24 hour management of active medical problems listed below.  Physiatrist and rehab team continue to assess barriers to discharge/monitor patient progress toward functional and medical goals  Care Tool:  Bathing    Body parts bathed by patient: Chest,Abdomen,Right upper leg,Left upper leg,Right arm,Face   Body parts bathed by helper: Left arm,Front perineal area,Buttocks,Right lower leg,Left lower leg     Bathing assist Assist Level: Moderate Assistance - Patient 50 - 74%     Upper Body Dressing/Undressing Upper body dressing   What is the patient wearing?: Pull over shirt    Upper body assist Assist Level: Moderate Assistance - Patient 50 - 74%    Lower Body Dressing/Undressing Lower body dressing      What is the patient wearing?: Pants     Lower body assist Assist for lower body dressing: Total Assistance - Patient < 25%  Toileting Toileting    Toileting assist Assist for toileting: Total Assistance - Patient < 25%     Transfers Chair/bed transfer  Transfers assist     Chair/bed transfer assist level: Moderate Assistance - Patient 50 - 74%     Locomotion Ambulation   Ambulation assist      Assist level: Total Assistance - Patient < 25% Assistive device: Lite Gait Max distance: 9'    Walk 10 feet activity   Assist     Assist level: 2 helpers Assistive device: Hand held assist   Walk 50 feet activity   Assist Walk 50 feet with 2 turns activity did not occur: Safety/medical concerns         Walk 150 feet activity   Assist Walk 150 feet activity did not occur: Safety/medical concerns         Walk 10 feet on uneven surface  activity   Assist Walk 10 feet on uneven surfaces activity did not occur: Safety/medical concerns         Wheelchair     Assist Will patient use wheelchair at discharge?: Yes Type of Wheelchair: Manual Wheelchair activity did not occur: Safety/medical concerns         Wheelchair 50 feet with 2 turns activity    Assist    Wheelchair 50 feet with 2 turns activity did not occur: Safety/medical concerns       Wheelchair 150 feet activity     Assist  Wheelchair 150 feet activity did not occur: Safety/medical concerns       Blood pressure 118/61, pulse 89, temperature 98.4 F (36.9 C), resp. rate (!) 21, height 5\' 5"  (1.651 m), weight 56.7 kg, last menstrual period 11/15/2008, SpO2 96 %.  Medical Problem List and Plan: 1.  L frontal ICH secondary to recent starting of Coumadin- Coumadin on hold with R hemiplegia             -patient may shower             -ELOS/Goals: 12/31  --Continue CIR therapies including PT, OT, and SLP.    2.  Portal vein thrombosis/A fib/Antithrombotics: -DVT/anticoagulation:  Pharmaceutical: SCDs due to thrombocytopenia and recent bleed.  --per neuro no coumadin-->will need to wait at least one month due to size of bleed.  --dopplers ok             -antiplatelet therapy: N/A 3. Pain Management: tylenol prn.   - kpad for neck pain  -pt to work with pt today, might benefit from massage, K-tape  -will replace robaxin with flexeril prn  -may have tramadol q8 prn  -12/17 right shoulder/neck pain still an issue off an on. Would like 50mg  tramdol   -will increase tramadol to  50mg  q8 prn 4. Mood: LCSW to follow up for evaluation and support.              -antipsychotic agents: N/A  -add trazodone qhs prn for sleep 5. Neuropsych: This patient is capable of making decisions on her own behalf. 6. Wound ulcer/Skin/Wound Care:Pack daily wound care LLE.    -turning, nutrition, education  12/14 -added scheduled benadryl for itching. Also receiving sarna        -prn vistaril 7. Fluids/Electrolytes/Nutrition: Strict I/O.  12/14: labs with HD TTS 8. ESRD: Continue HD on TTS at the end of the day to help with therapy tolerance.  9. HTN: Monitor BP tid-currently controlled on current regimen 10. Pulmonary HTN: Oxygen dependent. Continue Ambrisentan and Selexipag. On Home  dose 2L by [place] 11. Cirrhosis of the liver: Monitor platelets and for any signs of bleeding. Reporting abdominal distension.  (protal vein thrombus)  -f/u abd CT as outpt 12. Chronic systolic CHF: Monitor daily weights as well as I/O. Continue tadalafil.    Filed Weights   12/27/19 1408 12/27/19 1713 12/28/19 0538  Weight: 55 kg 53.8 kg 56.7 kg    -volume mgt per nephro 13. CAF: Monitor HR tid--metoprolol has been on hold. Coumadin to be held due to [place].  14. Pancytopenia:  Continue to monitor with serial checks.   12/17 hgb down to 7.0 yesterday. Doesn't appear to be symptomatic    -recheck today  15. Constipation WITH hemorrhoids- seems to cycle between loose stool  -prn miralax only  12/13 pt with mushy,loose stools yet seems to report constipation   -backed of scheduled senna-s for now   -added daily fiber packet  -encouraging regular solid/liquid intake as well  -stopped amitiza 12/14  -resumed senna-s at bedtime 12/15 16. Significant epistaxis (pt has prior history)  12/16 -recurrent bleeding in right nose---stopped with rocket   -ENT Benjamine Mola) is planning on potential cauterization this weekend 17. Insomnia:   12/17: pt wants to try 50mg  trazodone at this time 18. Right neck nodule appears  resolved. Likely node    LOS: 10 days A FACE TO FACE EVALUATION WAS PERFORMED  Meredith Staggers 12/28/2019, 10:27 AM

## 2019-12-28 NOTE — Progress Notes (Signed)
Patient ID: Katie Nunez, female   DOB: 01/18/1955, 64 y.o.   MRN: [id number]  SW met with pt in room to answer questions related to what is Min A level considered as this is the target goal level of care. SW discussed with pt preparing to have 24/7 care to ensure she always has help available since husband works, however, we are hopeful she can work towards intermittent level of care need by discharge. SW reminded pt the purpose of family education is for her and husband to determine if they can manage her care needs at home. SW discussed with pt SNF list left in room. SW informed on referrals sent out, and if she is interested and believes SNF is needed, SW will discuss preference. SW explained [place] level II PASRR. Pt completed assessment with State reviewer Katie Nunez [phone]). Pt encouraged to remain positive during this time since she is used to being independent and not asking for help.   Loralee Pacas, MSW, [place] Office: [phone] Cell: [phone] Fax: [phone]

## 2019-12-28 NOTE — Progress Notes (Signed)
Speech Language Pathology Daily Session Note  Patient Details  Name: JULIETTE STANDRE MRN: [id number] Date of Birth: 1955-03-26  Today's Date: 12/28/2019 SLP Individual Time: 1400-1500 SLP Individual Time Calculation (min): 60 min  Short Term Goals: Week 2: SLP Short Term Goal 1 (Week 2): Patient will demonstrate complex problem solving for functional and familiar tasks withMod I. SLP Short Term Goal 2 (Week 2): Patient will recall new, daily information with Supervision verbal cues for use of memory compensatory strategies. SLP Short Term Goal 3 (Week 2): Patient will demonstrate selective attention to tasks in a mildly distracting enviornment for 45 minutes with Supervision verbal cues for redirection.  Skilled Therapeutic Interventions: Skilled treatment session focused on cognitive goals, pt pleasant and cooperative with all therapeutic tasks. SLP providing re-education re: compensatory memory strategies and utilization of cell phone to increase and maintain independence with daily routine at home. Moderately complex problem solving/safety task completed with 90% accuracy independent, min cues increased to 100%. Pt states goal remains to d/c home a min A level vs SNF. Pt left in bed with alarm on and husband present. Cont ST POC.  Pain Pain Assessment Pain Scale: 0-10 Pain Score: 7  Pain Type: Acute pain Pain Location: Back Pain Orientation: Right;Lower Pain Radiating Towards: hip Pain Descriptors / Indicators: Aching;Constant Pain Frequency: Constant Pain Onset: On-going Patients Stated Pain Goal: 4 Pain Intervention(s): Medication (See eMAR) (tramadol) Multiple Pain Sites: No  Therapy/Group: Individual Therapy  Dewaine Conger 12/28/2019, 2:56 PM

## 2019-12-28 NOTE — Progress Notes (Signed)
Subjective: Pt resting comfortably in bed. No significant bleeding today.  Objective: Vital signs in last 24 hours: Temp:  [97.8 F (36.6 C)-98.5 F (36.9 C)] 97.8 F (36.6 C) (12/17 1308) Pulse Rate:  [73-89] 87 (12/17 1308) Resp:  [16-21] 20 (12/17 1308) BP: (118-140)/(58-75) 122/58 (12/17 1308) SpO2:  [90 %-97 %] 97 % (12/17 1308) Weight:  [53.8 kg-56.7 kg] 56.7 kg (12/17 0538)  Physical Exam: General appearance:alert, cooperative and no distress Head:Normocephalic, without obvious abnormality, atraumatic Eyes: Pupils are equal, round, reactive to light. Extraocular motion is intact.  Ears: Examination of the ears shows normal auricles and external auditory canals bilaterally. Nose:Rhinorocket packing inrightNC.No bleeding is noted. Face: Facial examination shows no asymmetry. Palpation of the face elicit no significant tenderness.  Mouth: Oral cavity examination shows no mucosal lacerations. No significant trismus is noted.  Neck: Palpation of the neck reveals no lymphadenopathy or mass. The trachea is midline.   Recent Labs    12/26/19 1429 12/27/19 0858 12/28/19 1110  WBC 3.0* 3.3*  --   HGB 9.4* 7.0* 9.8*  HCT 28.6* 20.6* 30.1*  PLT 174 PLATELET CLUMPS NOTED ON SMEAR, COUNT APPEARS ADEQUATE  --    Recent Labs    12/26/19 1430  NA 132*  K 4.4  CL 94*  CO2 21*  GLUCOSE 82  BUN 85*  CREATININE 9.45*  CALCIUM 10.0    Medications:  I have reviewed the patient's current medications. Scheduled: . ambrisentan  5 mg Oral Daily  . azelastine  1 spray Each Nare BID  . calcium acetate  1,334 mg Oral TID WC  . camphor-menthol   Topical BID  . cephALEXin  500 mg Oral Q24H  . Chlorhexidine Gluconate Cloth  6 each Topical Q0600  . Chlorhexidine Gluconate Cloth  6 each Topical Q0600  . cinacalcet  30 mg Oral Q T,Th,Sa-HD  . diphenhydrAMINE  25 mg Oral Q12H  . doxercalciferol  6 mcg Intravenous Q T,Th,Sa-HD  . feeding supplement (NEPRO CARB STEADY)  237 mL  Oral BID BM  . Gerhardt's butt cream   Topical QID  . hydrocerin   Topical BID  . hydrocortisone  25 mg Rectal BID  . loratadine  10 mg Oral Daily  . oxymetazoline  1 spray Each Nare BID  . pentoxifylline  400 mg Oral Daily  . psyllium  1 packet Oral BID  . Selexipag  800 mcg Oral BID  . sodium chloride  1 spray Each Nare 5 X Daily  . tadalafil  20 mg Oral Daily   Continuous:   Assessment/Plan: Recurrent epistaxis, s/p multiple nasal packing. - Bleeding now controlled with right nasal packing. - In light of her recurrent bleeding, she may benefit from endoscopic cauterization of her bleeding sources in the operating room.  - The procedure is scheduled for tomorrow at approximately 11:30am.   LOS: 10 days   Tereso Unangst W Ardella Chhim 12/28/2019, 4:54 PM

## 2019-12-28 NOTE — Progress Notes (Signed)
Patient not reports loose stools--back on Keflex for prophylaxis due to rhino rocket--will change senna to prn. Patient educated on importance of advocating for bowel meds as diarrhea/constipation issues have been ongoing for a long time.

## 2019-12-28 NOTE — Progress Notes (Signed)
Physical Therapy Session Note  Patient Details  Name: Katie Nunez MRN: [id number] Date of Birth: August 12, 1955  Today's Date: 12/28/2019 PT Individual Time: 0901-0959 PT Individual Time Calculation (min): 58 min   Short Term Goals: Week 2:  PT Short Term Goal 1 (Week 2): Pt will perform bed mobility with minA. PT Short Term Goal 2 (Week 2): Pt will perform sit to stand with minA. PT Short Term Goal 3 (Week 2): Pt will perform bed to chair transfer with minA. PT Short Term Goal 4 (Week 2): Pt will ambulates 37' with modA.  Skilled Therapeutic Interventions/Progress Updates:     Pt received seated in [facility] and agrees to therapy. No complaint of pain. Sit to stand from [facility] with modA and PT blocking R knee and assisting pt to pull up pants. WC transport to gym for time management. Focus of session on standing balance and NMR with WB through R hemibody. Pt performs multiple reps of sit to stand and high low table with minA/modA and cues on body mechanics and sequencing. Pt then performs reaching to the L to retrive clothespins then shifting weight toward R to place clothespins on basketball net. PT facilitates weight shifts with manual pressure on R hand for WB as well as blocking of R knee. Pt takes multiple seated rest breaks. PT notes motor activation in R leg including palpable knee extension, flexion, hip abduction and adduction! WC transport back to room. Pt left seated in WC with all needs within reach.  Therapy Documentation Precautions:  Precautions Precautions: Fall Precaution Comments: Rt hemiplegia - subluxation Restrictions Weight Bearing Restrictions: No   Therapy/Group: Individual Therapy  Breck Coons, PT, DPT 12/28/2019, 4:32 PM

## 2019-12-28 NOTE — H&P (View-Only) (Signed)
Subjective: Pt resting comfortably in bed. No significant bleeding today.  Objective: Vital signs in last 24 hours: Temp:  [97.8 F (36.6 C)-98.5 F (36.9 C)] 97.8 F (36.6 C) (12/17 1308) Pulse Rate:  [73-89] 87 (12/17 1308) Resp:  [16-21] 20 (12/17 1308) BP: (118-140)/(58-75) 122/58 (12/17 1308) SpO2:  [90 %-97 %] 97 % (12/17 1308) Weight:  [53.8 kg-56.7 kg] 56.7 kg (12/17 0538)  Physical Exam: General appearance:alert, cooperative and no distress Head:Normocephalic, without obvious abnormality, atraumatic Eyes: Pupils are equal, round, reactive to light. Extraocular motion is intact.  Ears: Examination of the ears shows normal auricles and external auditory canals bilaterally. Nose:Rhinorocket packing inrightNC.No bleeding is noted. Face: Facial examination shows no asymmetry. Palpation of the face elicit no significant tenderness.  Mouth: Oral cavity examination shows no mucosal lacerations. No significant trismus is noted.  Neck: Palpation of the neck reveals no lymphadenopathy or mass. The trachea is midline.   Recent Labs    12/26/19 1429 12/27/19 0858 12/28/19 1110  WBC 3.0* 3.3*  --   HGB 9.4* 7.0* 9.8*  HCT 28.6* 20.6* 30.1*  PLT 174 PLATELET CLUMPS NOTED ON SMEAR, COUNT APPEARS ADEQUATE  --    Recent Labs    12/26/19 1430  NA 132*  K 4.4  CL 94*  CO2 21*  GLUCOSE 82  BUN 85*  CREATININE 9.45*  CALCIUM 10.0    Medications:  I have reviewed the patient's current medications. Scheduled: . ambrisentan  5 mg Oral Daily  . azelastine  1 spray Each Nare BID  . calcium acetate  1,334 mg Oral TID WC  . camphor-menthol   Topical BID  . cephALEXin  500 mg Oral Q24H  . Chlorhexidine Gluconate Cloth  6 each Topical Q0600  . Chlorhexidine Gluconate Cloth  6 each Topical Q0600  . cinacalcet  30 mg Oral Q T,Th,Sa-HD  . diphenhydrAMINE  25 mg Oral Q12H  . doxercalciferol  6 mcg Intravenous Q T,Th,Sa-HD  . feeding supplement (NEPRO CARB STEADY)  237 mL  Oral BID BM  . Gerhardt's butt cream   Topical QID  . hydrocerin   Topical BID  . hydrocortisone  25 mg Rectal BID  . loratadine  10 mg Oral Daily  . oxymetazoline  1 spray Each Nare BID  . pentoxifylline  400 mg Oral Daily  . psyllium  1 packet Oral BID  . Selexipag  800 mcg Oral BID  . sodium chloride  1 spray Each Nare 5 X Daily  . tadalafil  20 mg Oral Daily   Continuous:   Assessment/Plan: Recurrent epistaxis, s/p multiple nasal packing. - Bleeding now controlled with right nasal packing. - In light of her recurrent bleeding, she may benefit from endoscopic cauterization of her bleeding sources in the operating room.  - The procedure is scheduled for tomorrow at approximately 11:30am.   LOS: 10 days   Jontavious Commons W Ahmaya Ostermiller 12/28/2019, 4:54 PM

## 2019-12-28 NOTE — Progress Notes (Signed)
Occupational Therapy Session Note  Patient Details  Name: Katie Nunez MRN: [id number] Date of Birth: January 09, 1956  Today's Date: 12/28/2019 OT Individual Time: 8325-4982 OT Individual Time Calculation (min): 57 min    Short Term Goals: Week 2:  OT Short Term Goal 1 (Week 2): Pt will don pants over feet with mod A. OT Short Term Goal 2 (Week 2): Pt will stand at toilet with mod A of 1. OT Short Term Goal 3 (Week 2): Patient will reposition R UE prior to transfer with min questioning cues OT Short Term Goal 4 (Week 2): Pt will complete self ROM with min cues  Skilled Therapeutic Interventions/Progress Updates:    Patient greeted seated in wc, having blood draw, but agreeable to OT treatment session. Pt brought to the sink in wc to wash her face with set-up A. Pt then brought down to therapy gym. R UE NMR with weight bearing towel pushes. Pt with some shoulder and scapula activation. OT placed elbow pad for protection on R UE. Continued R UE NMR with joint input to bring R UE through full elbow/wrist/hand ROM. Sit<>stands at high-low table with mod A. Pt completed stand-pivot back to bed with mod A. OT placed SAEBO e-stim on shoulder for subluxation. SAEBO left on for 60 minutes. OT returned to remove SAEBO with skin intact and no adverse reactions.  Sa[place]o Stim One 330 pulse width 35 Hz pulse rate On 8 sec/ off 8 sec Ramp up/ down 2 sec Symmetrical Biphasic wave form  Max intensity 181mA at 500 Ohm load    Therapy Documentation Precautions:  Precautions Precautions: Fall Precaution Comments: Rt hemiplegia - subluxation Restrictions Weight Bearing Restrictions: No Pain:  denies pain  Therapy/Group: Individual Therapy  Valma Cava 12/28/2019, 12:02 PM

## 2019-12-29 ENCOUNTER — Inpatient Hospital Stay (HOSPITAL_COMMUNITY): Payer: Medicare Other | Admitting: Certified Registered Nurse Anesthetist

## 2019-12-29 ENCOUNTER — Encounter (HOSPITAL_COMMUNITY)
Admission: RE | Disposition: A | Discharge status: Home Health | Payer: Self-pay | Source: Intra-hospital | Attending: Physical Medicine & Rehabilitation

## 2019-12-29 ENCOUNTER — Inpatient Hospital Stay (HOSPITAL_COMMUNITY): Payer: Medicare Other | Admitting: Occupational Therapy

## 2019-12-29 ENCOUNTER — Encounter (HOSPITAL_COMMUNITY): Payer: Self-pay | Admitting: Physical Medicine & Rehabilitation

## 2019-12-29 HISTORY — PX: NASAL ENDOSCOPY WITH EPISTAXIS CONTROL: SHX5664

## 2019-12-29 LAB — POCT I-STAT, CHEM 8
BUN: 41 mg/dL — ABNORMAL HIGH (ref 8–23)
Calcium, Ion: 1.04 mmol/L — ABNORMAL LOW (ref 1.15–1.40)
Chloride: 100 mmol/L (ref 98–111)
Creatinine, Ser: 6.6 mg/dL — ABNORMAL HIGH (ref 0.44–1.00)
Glucose, Bld: 74 mg/dL (ref 70–99)
HCT: 37 % (ref 36.0–46.0)
Hemoglobin: 12.6 g/dL (ref 12.0–15.0)
Potassium: 3.9 mmol/L (ref 3.5–5.1)
Sodium: 134 mmol/L — ABNORMAL LOW (ref 135–145)
TCO2: 25 mmol/L (ref 22–32)

## 2019-12-29 LAB — MRSA PCR SCREENING: MRSA by PCR: NEGATIVE

## 2019-12-29 SURGERY — CONTROL OF EPISTAXIS, ENDOSCOPIC
Anesthesia: Monitor Anesthesia Care | Site: Nose

## 2019-12-29 MED ORDER — FENTANYL CITRATE (PF) 250 MCG/5ML IJ SOLN
INTRAMUSCULAR | Status: AC
  Filled 2019-12-29: qty 5

## 2019-12-29 MED ORDER — CHLORHEXIDINE GLUCONATE 0.12 % MT SOLN
15.0000 mL | Freq: Once | OROMUCOSAL | Status: AC
  Administered 2019-12-29: 11:00:00 15 mL via OROMUCOSAL

## 2019-12-29 MED ORDER — OXYMETAZOLINE HCL 0.05 % NA SOLN
NASAL | Status: AC
  Filled 2019-12-29: qty 30

## 2019-12-29 MED ORDER — MIDAZOLAM HCL 2 MG/2ML IJ SOLN
INTRAMUSCULAR | Status: DC | PRN
  Administered 2019-12-29 (×2): 1 mg via INTRAVENOUS

## 2019-12-29 MED ORDER — LIDOCAINE-EPINEPHRINE 1 %-1:100000 IJ SOLN
INTRAMUSCULAR | Status: DC | PRN
  Administered 2019-12-29: 30 mL

## 2019-12-29 MED ORDER — DEXMEDETOMIDINE (PRECEDEX) IN NS 20 MCG/5ML (4 MCG/ML) IV SYRINGE
PREFILLED_SYRINGE | INTRAVENOUS | Status: DC | PRN
  Administered 2019-12-29: 8 ug via INTRAVENOUS

## 2019-12-29 MED ORDER — SODIUM CHLORIDE 0.9 % IV SOLN: INTRAVENOUS | Status: DC

## 2019-12-29 MED ORDER — MIDAZOLAM HCL 2 MG/2ML IJ SOLN
INTRAMUSCULAR | Status: AC
  Filled 2019-12-29: qty 2

## 2019-12-29 MED ORDER — LIDOCAINE-EPINEPHRINE 1 %-1:100000 IJ SOLN
INTRAMUSCULAR | Status: AC
  Filled 2019-12-29: qty 1

## 2019-12-29 MED ORDER — FENTANYL CITRATE (PF) 100 MCG/2ML IJ SOLN: 25.0000 ug | INTRAMUSCULAR | Status: DC | PRN

## 2019-12-29 MED ORDER — FENTANYL CITRATE (PF) 250 MCG/5ML IJ SOLN
INTRAMUSCULAR | Status: DC | PRN
  Administered 2019-12-29: 25 ug via INTRAVENOUS
  Administered 2019-12-29: 50 ug via INTRAVENOUS
  Administered 2019-12-29: 25 ug via INTRAVENOUS

## 2019-12-29 MED ORDER — ONDANSETRON HCL 4 MG/2ML IJ SOLN: 4.0000 mg | Freq: Once | INTRAMUSCULAR | Status: DC | PRN

## 2019-12-29 MED ORDER — PROPOFOL 10 MG/ML IV BOLUS
INTRAVENOUS | Status: AC
  Filled 2019-12-29: qty 20

## 2019-12-29 MED ORDER — OXYMETAZOLINE HCL 0.05 % NA SOLN
NASAL | Status: DC | PRN
  Administered 2019-12-29: 1 via TOPICAL

## 2019-12-29 MED ORDER — MEPERIDINE HCL 25 MG/ML IJ SOLN: 6.2500 mg | INTRAMUSCULAR | Status: DC | PRN

## 2019-12-29 MED ORDER — 0.9 % SODIUM CHLORIDE (POUR BTL) OPTIME
TOPICAL | Status: DC | PRN
  Administered 2019-12-29: 12:00:00 1000 mL

## 2019-12-29 SURGICAL SUPPLY — 32 items
BLADE SURG 15 STRL LF DISP TIS (BLADE) IMPLANT
BLADE SURG 15 STRL SS (BLADE)
CANISTER SUCT 3000ML PPV (MISCELLANEOUS) ×2 IMPLANT
COAGULATOR SUCT SWTCH 10FR 6 (ELECTROSURGICAL) ×2 IMPLANT
COVER BACK TABLE 60X90IN (DRAPES) ×2 IMPLANT
COVER MAYO STAND STRL (DRAPES) ×2 IMPLANT
COVER WAND RF STERILE (DRAPES) ×1 IMPLANT
DRAPE HALF SHEET 40X57 (DRAPES) ×2 IMPLANT
DRESSING NASAL POPE 10X1.5X2.5 (GAUZE/BANDAGES/DRESSINGS) IMPLANT
DRSG NASAL POPE 10X1.5X2.5 (GAUZE/BANDAGES/DRESSINGS) ×2
ELECT REM PT RETURN 9FT ADLT (ELECTROSURGICAL) ×2
ELECTRODE REM PT RTRN 9FT ADLT (ELECTROSURGICAL) ×1 IMPLANT
GAUZE 4X4 16PLY RFD (DISPOSABLE) ×2 IMPLANT
GAUZE SPONGE 2X2 8PLY STRL LF (GAUZE/BANDAGES/DRESSINGS) ×1 IMPLANT
GAUZE VASELINE FOILPK 1/2 X 72 (GAUZE/BANDAGES/DRESSINGS) IMPLANT
GLOVE ECLIPSE 7.5 STRL STRAW (GLOVE) ×2 IMPLANT
GOWN STRL REUS W/ TWL LRG LVL3 (GOWN DISPOSABLE) ×2 IMPLANT
GOWN STRL REUS W/TWL LRG LVL3 (GOWN DISPOSABLE) ×4
HEMOSTAT SURGICEL 2X14 (HEMOSTASIS) IMPLANT
KIT BASIN OR (CUSTOM PROCEDURE TRAY) ×2 IMPLANT
KIT TURNOVER KIT B (KITS) ×2 IMPLANT
NDL HYPO 25GX1X1/2 BEV (NEEDLE) ×1 IMPLANT
NEEDLE HYPO 25GX1X1/2 BEV (NEEDLE) ×2 IMPLANT
NS IRRIG 1000ML POUR BTL (IV SOLUTION) ×2 IMPLANT
PAD ARMBOARD 7.5X6 YLW CONV (MISCELLANEOUS) ×4 IMPLANT
SPONGE GAUZE 2X2 STER 10/PKG (GAUZE/BANDAGES/DRESSINGS)
SPONGE NEURO XRAY DETECT 1X3 (DISPOSABLE) ×2 IMPLANT
SURGIFLO W/THROMBIN 8M KIT (HEMOSTASIS) IMPLANT
SYR CONTROL 10ML LL (SYRINGE) ×2 IMPLANT
TOWEL GREEN STERILE FF (TOWEL DISPOSABLE) ×4 IMPLANT
TUBE CONNECTING 12X1/4 (SUCTIONS) ×2 IMPLANT
WATER STERILE IRR 1000ML POUR (IV SOLUTION) ×2 IMPLANT

## 2019-12-29 NOTE — Transfer of Care (Signed)
Immediate Anesthesia Transfer of Care Note  Patient: Katie Nunez  Procedure(s) Performed: NASAL ENDOSCOPY WITH EPISTAXIS CONTROL (N/A Nose)  Patient Location: PACU  Anesthesia Type:MAC  Level of Consciousness: awake, alert  and oriented  Airway & Oxygen Therapy: Patient Spontanous Breathing and Patient connected to face mask oxygen  Post-op Assessment: Report given to RN and Post -op Vital signs reviewed and stable  Post vital signs: Reviewed and stable  Last Vitals:  Vitals Value Taken Time  BP 128/60 12/29/19 1220  Temp 36.4 C 12/29/19 1204  Pulse 81 12/29/19 1228  Resp 20 12/29/19 1226  SpO2 88 % 12/29/19 1228  Vitals shown include unvalidated device data.  Last Pain:  Vitals:   12/29/19 1205  TempSrc:   PainSc: 0-No pain      Patients Stated Pain Goal: 4 (80/99/83 3825)  Complications: No complications documented.

## 2019-12-29 NOTE — Op Note (Signed)
DATE OF PROCEDURE:  12/29/2019                              OPERATIVE REPORT  SURGEON:  Leta Baptist, MD  PREOPERATIVE DIAGNOSES: 1. Bilateral recurrent epistaxis.  POSTOPERATIVE DIAGNOSES: 1. Bilateral recurrent epistaxis.  PROCEDURE PERFORMED: Bilateral endoscopic control of nasal hemorrhage with cauterization.  ANESTHESIA: IV sedation.  COMPLICATIONS:  None.  ESTIMATED BLOOD LOSS:  Minimal.  INDICATION FOR PROCEDURE:   Katie Nunez is a 64 y.o. female with a history of cirrhosis and frequent recurrent epistaxis.She was previously treated with nasal cauterization and multiple nasal packing.  Despite the treatment, she continued to have a recurrent epistaxis.  Based on the above findings, the decision was made for the patient to undergo the above-stated procedure. Likelihood of success in reducing symptoms was also discussed.  The risks, benefits, alternatives, and details of the procedure were discussed with the patient.  Questions were invited and answered.  Informed consent was obtained.  DESCRIPTION:  The patient was taken to the operating room and placed supine on the operating table.  IV sedation was administered by the anesthesiologist.  The night nasal packing was removed.  Using a 0 degree endoscope, the right nasal cavity was examined.  Multiple hypervascular areas were noted at the right anterior and superior nasal septum.  The hypervascular areas were cauterized with a suction electrocautery device.  Bleeding was noted and controlled.  Merocel packing was placed. The same procedure was repeated on the left side without exception. The care of the patient was turned over to the anesthesiologist. The patient was transferred to the recovery room in good condition.  OPERATIVE FINDINGS: Hypervascular areas were noted on the nasal septum bilaterally.  SPECIMEN:  None.  FOLLOWUP CARE:  The patient will be returned to her rehab floor.  Her nasal packing will be removed on  Monday.  Katie Nunez 12/29/2019

## 2019-12-29 NOTE — Progress Notes (Signed)
Per Dr. Benjamine Mola patient can go back to 36M after surgery.

## 2019-12-29 NOTE — Progress Notes (Signed)
[place] PHYSICAL MEDICINE & REHABILITATION PROGRESS NOTE   Subjective/Complaints:  Going to OR- to cauterize nose.   Hard to push BM out- it's not hard though.   but wants to wait on bowel meds.   Wants to eat before dialysis.    ROS:   Objective:   No results found. Recent Labs    12/26/19 1429 12/27/19 0858 12/28/19 1110 12/29/19 1111  WBC 3.0* 3.3*  --   --   HGB 9.4* 7.0* 9.8* 12.6  HCT 28.6* 20.6* 30.1* 37.0  PLT 174 PLATELET CLUMPS NOTED ON SMEAR, COUNT APPEARS ADEQUATE  --   --    Recent Labs    12/26/19 1430 12/29/19 1111  NA 132* 134*  K 4.4 3.9  CL 94* 100  CO2 21*  --   GLUCOSE 82 74  BUN 85* 41*  CREATININE 9.45* 6.60*  CALCIUM 10.0  --     Intake/Output Summary (Last 24 hours) at 12/29/2019 1312 Last data filed at 12/29/2019 0900 Gross per 24 hour  Intake 360 ml  Output --  Net 360 ml     Pressure Injury 12/18/19 Sacrum Medial Stage 1 -  Intact skin with non-blanchable redness of a localized area usually over a bony prominence. Red, non-blanchable spot over sacrum area (Active)  12/18/19 1707  Location: Sacrum  Location Orientation: Medial  Staging: Stage 1 -  Intact skin with non-blanchable redness of a localized area usually over a bony prominence.  Wound Description (Comments): Red, non-blanchable spot over sacrum area  Present on Admission: Yes    Physical Exam: Constitutional: awake, alert, appropriate, NAD HEENT: EOMI, oral membranes moist, right nare packed still Neck: supple Cardiovascular: RRR Respiratory/Chest: CTA B/L- no W/R/R- good air movement  GI/Abdomen: Soft, NT, ND, (+)BS  Ext: no clubbing, cyanosis, or edema Psych: pleasant and cooperative Skin:   LUE fistula intact. Left calf wound dressed. mild sacral redness present Neuro: Pt is cognitively appropriate with normal insight, memory, and awareness. Cranial nerves 2-12 are intact. Seems to sense pain and LT in all 4's. Reflexes are 2+ on left. 3+ right. Fine  motor coordination is intact. No tremors. Motor function is grossly 5/5 LUE and LLE.  RUE  0/5.  RLE tr-1 Hamstrings and HAD, otherwise 0/5 Musculoskeletal: minimal jt tenderness today   Vital Signs Blood pressure 128/60, pulse 76, temperature 97.6 F (36.4 C), resp. rate 17, height 5\' 5"  (1.651 m), weight 53.7 kg, last menstrual period 11/15/2008, SpO2 100 %.  Assessment/Plan: 1. Functional deficits which require 3+ hours per day of interdisciplinary therapy in a comprehensive inpatient rehab setting.  Physiatrist is providing close team supervision and 24 hour management of active medical problems listed below.  Physiatrist and rehab team continue to assess barriers to discharge/monitor patient progress toward functional and medical goals  Care Tool:  Bathing    Body parts bathed by patient: Chest,Abdomen,Right upper leg,Left upper leg,Right arm,Face   Body parts bathed by helper: Left arm,Front perineal area,Buttocks,Right lower leg,Left lower leg     Bathing assist Assist Level: Moderate Assistance - Patient 50 - 74%     Upper Body Dressing/Undressing Upper body dressing   What is the patient wearing?: Pull over shirt    Upper body assist Assist Level: Moderate Assistance - Patient 50 - 74%    Lower Body Dressing/Undressing Lower body dressing      What is the patient wearing?: Pants     Lower body assist Assist for lower body dressing: Total Assistance -  Patient < 25%     Toileting Toileting    Toileting assist Assist for toileting: Total Assistance - Patient < 25%     Transfers Chair/bed transfer  Transfers assist     Chair/bed transfer assist level: Moderate Assistance - Patient 50 - 74%     Locomotion Ambulation   Ambulation assist      Assist level: Total Assistance - Patient < 25% Assistive device: Lite Gait Max distance: 9'   Walk 10 feet activity   Assist     Assist level: 2 helpers Assistive device: Hand held assist   Walk 50  feet activity   Assist Walk 50 feet with 2 turns activity did not occur: Safety/medical concerns         Walk 150 feet activity   Assist Walk 150 feet activity did not occur: Safety/medical concerns         Walk 10 feet on uneven surface  activity   Assist Walk 10 feet on uneven surfaces activity did not occur: Safety/medical concerns         Wheelchair     Assist Will patient use wheelchair at discharge?: Yes Type of Wheelchair: Manual Wheelchair activity did not occur: Safety/medical concerns         Wheelchair 50 feet with 2 turns activity    Assist    Wheelchair 50 feet with 2 turns activity did not occur: Safety/medical concerns       Wheelchair 150 feet activity     Assist  Wheelchair 150 feet activity did not occur: Safety/medical concerns       Blood pressure 128/60, pulse 76, temperature 97.6 F (36.4 C), resp. rate 17, height 5\' 5"  (1.651 m), weight 53.7 kg, last menstrual period 11/15/2008, SpO2 100 %.  Medical Problem List and Plan: 1.  L frontal ICH secondary to recent starting of Coumadin- Coumadin on hold with R hemiplegia             -patient may shower             -ELOS/Goals: 12/31  --Continue CIR therapies including PT, OT, and SLP.    2.  Portal vein thrombosis/A fib/Antithrombotics: -DVT/anticoagulation:  Pharmaceutical: SCDs due to thrombocytopenia and recent bleed.  --per neuro no coumadin-->will need to wait at least one month due to size of bleed.  --dopplers ok             -antiplatelet therapy: N/A 3. Pain Management: tylenol prn.   - kpad for neck pain  -pt to work with pt today, might benefit from massage, K-tape  -will replace robaxin with flexeril prn  -may have tramadol q8 prn  -12/17 right shoulder/neck pain still an issue off an on. Would like 50mg  tramdol   -will increase tramadol to 50mg  q8 prn  12/18- pain controlled- con't regimen 4. Mood: LCSW to follow up for evaluation and support.               -antipsychotic agents: N/A  -add trazodone qhs prn for sleep 5. Neuropsych: This patient is capable of making decisions on her own behalf. 6. Wound ulcer/Skin/Wound Care:Pack daily wound care LLE.    -turning, nutrition, education  12/14 -added scheduled benadryl for itching. Also receiving sarna        -prn vistaril 7. Fluids/Electrolytes/Nutrition: Strict I/O.  12/14: labs with HD TTS 8. ESRD: Continue HD on TTS at the end of the day to help with therapy tolerance.  9. HTN: Monitor BP tid-currently controlled on current  regimen 10. Pulmonary HTN: Oxygen dependent. Continue Ambrisentan and Selexipag. On Home dose 2L by [place] 11. Cirrhosis of the liver: Monitor platelets and for any signs of bleeding. Reporting abdominal distension.  (protal vein thrombus)  -f/u abd CT as outpt 12. Chronic systolic CHF: Monitor daily weights as well as I/O. Continue tadalafil.    Filed Weights   12/27/19 1713 12/28/19 0538 12/29/19 0336  Weight: 53.8 kg 56.7 kg 53.7 kg    -volume mgt per nephro 13. CAF: Monitor HR tid--metoprolol has been on hold. Coumadin to be held due to [place].  14. Pancytopenia:  Continue to monitor with serial checks.   12/17 hgb down to 7.0 yesterday. Doesn't appear to be symptomatic    -recheck today  12/18- waiting on labs- will recheck Sunday of need be.   15. Constipation WITH hemorrhoids- seems to cycle between loose stool  -prn miralax only  12/13 pt with mushy,loose stools yet seems to report constipation   -backed of scheduled senna-s for now   -added daily fiber packet  -encouraging regular solid/liquid intake as well  -stopped amitiza 12/14  -resumed senna-s at bedtime 12/15 16. Significant epistaxis (pt has prior history)  12/16 -recurrent bleeding in right nose---stopped with rocket   -ENT Benjamine Mola) is planning on potential cauterization this weekend  12/18- went to OR this AM for this. Hopefully resolved.  17. Insomnia:   12/17: pt wants to try 50mg  trazodone at  this time 18. Right neck nodule appears resolved. Likely node    LOS: 11 days A FACE TO FACE EVALUATION WAS PERFORMED  Vergie Zahm 12/29/2019, 1:12 PM

## 2019-12-29 NOTE — Anesthesia Preprocedure Evaluation (Signed)
Anesthesia Evaluation  Patient identified by MRN, date of birth, ID band Patient awake    Reviewed: Allergy & Precautions, NPO status , Patient's Chart, lab work & pertinent test results  Airway Mallampati: II  TM Distance: >3 FB Neck ROM: Full    Dental  (+) Dental Advisory Given, Missing,    Pulmonary neg pulmonary ROS,    Pulmonary exam normal breath sounds clear to auscultation       Cardiovascular hypertension, Pt. on medications + Peripheral Vascular Disease  Normal cardiovascular exam Rhythm:Regular Rate:Normal     Neuro/Psych negative neurological ROS  negative psych ROS   GI/Hepatic negative GI ROS, Neg liver ROS, GERD  Medicated,  Endo/Other  Hypothyroidism   Renal/GU ESRF and Renal InsufficiencyRenal diseasenegative Renal ROS  negative genitourinary   Musculoskeletal negative musculoskeletal ROS (+) Arthritis , Rheumatoid disorders,  Fibromyalgia -  Abdominal   Peds  Hematology negative hematology ROS (+) anemia ,   Anesthesia Other Findings   Reproductive/Obstetrics negative OB ROS                             Anesthesia Physical  Anesthesia Plan  ASA: III  Anesthesia Plan: MAC   Post-op Pain Management:    Induction: Intravenous  PONV Risk Score and Plan: 1 and 2 and Ondansetron, Dexamethasone and Treatment may vary due to age or medical condition  Airway Management Planned: Natural Airway and Simple Face Mask  Additional Equipment: None  Intra-op Plan:   Post-operative Plan:   Informed Consent: I have reviewed the patients History and Physical, chart, labs and discussed the procedure including the risks, benefits and alternatives for the proposed anesthesia with the patient or authorized representative who has indicated his/her understanding and acceptance.     Dental advisory given  Plan Discussed with: CRNA  Anesthesia Plan Comments:          Anesthesia Quick Evaluation

## 2019-12-29 NOTE — Anesthesia Postprocedure Evaluation (Signed)
Anesthesia Post Note  Patient: Katie Nunez  Procedure(s) Performed: NASAL ENDOSCOPY WITH EPISTAXIS CONTROL (N/A Nose)     Patient location during evaluation: PACU Anesthesia Type: MAC Level of consciousness: awake and alert Pain management: pain level controlled Vital Signs Assessment: post-procedure vital signs reviewed and stable Respiratory status: spontaneous breathing Cardiovascular status: stable Anesthetic complications: no   No complications documented.  Last Vitals:  Vitals:   12/29/19 1821 12/29/19 1917  BP: 121/62 125/60  Pulse: 78 82  Resp: 18 18  Temp: 36.7 C 37.1 C  SpO2: 98% 96%    Last Pain:  Vitals:   12/29/19 1821  TempSrc: Oral  PainSc: 0-No pain                 Nolon Nations

## 2019-12-29 NOTE — Progress Notes (Signed)
Occupational Therapy Session Note  Patient Details  Name: Katie Nunez MRN: [id number] Date of Birth: Feb 11, 1955  Today's Date: 12/29/2019 OT Individual Time: 0037-0488 OT Individual Time Calculation (min): 45 min    Short Term Goals: Week 2:  OT Short Term Goal 1 (Week 2): Pt will don pants over feet with mod A. OT Short Term Goal 2 (Week 2): Pt will stand at toilet with mod A of 1. OT Short Term Goal 3 (Week 2): Patient will reposition R UE prior to transfer with min questioning cues OT Short Term Goal 4 (Week 2): Pt will complete self ROM with min cues  Skilled Therapeutic Interventions/Progress Updates:    Patient greeted semi-reclined in bed with husband present and agreeable to OT treatment session. Pt completed bed mobility with mod A to advance R side. Pt needed max A to don shoes at EOB. Educated on squat-pivot transfer to drop arm wc with pt able to complete with min/mod A and R knee block. Stand-pivot to [facility] over toilet with mod A. Mod A to maintain standing balance while OT assisted with pants. Pt with small BM, and worked on lateral leans and hip hike for peri-care. Mod A to stand, then mod A again for balance while OT assisted with pulling up pants. Pt brought to the sink in wc to wash hands with OT assist to place R UE at the sink. Pre-op then entered room to take patient down for procedure. Mod A stand-pivot back to bed with mod A to return to supine. Pt left in care of nursing and operating team. Pt missed 15 minutes of OT treatment session 2/2 procedure.    Therapy Documentation Precautions:  Precautions Precautions: Fall Precaution Comments: Rt hemiplegia - subluxation Restrictions Weight Bearing Restrictions: No Pain:  denies pain  Therapy/Group: Individual Therapy  Valma Cava 12/29/2019, 10:01 AM

## 2019-12-29 NOTE — Interval H&P Note (Signed)
History and Physical Interval Note:  12/29/2019 11:24 AM  Katie Nunez  has presented today for surgery, with the diagnosis of Nasal Bleeding .  The various methods of treatment have been discussed with the patient and family. After consideration of risks, benefits and other options for treatment, the patient has consented to  Procedure(s): NASAL ENDOSCOPY WITH EPISTAXIS CONTROL (N/A) as a surgical intervention.  The patient's history has been reviewed, patient examined, no change in status, stable for surgery.  I have reviewed the patient's chart and labs.  Questions were answered to the patient's satisfaction.     Elva Breaker Cassie Freer

## 2019-12-30 ENCOUNTER — Encounter (HOSPITAL_COMMUNITY): Payer: Self-pay | Admitting: Otolaryngology

## 2019-12-30 ENCOUNTER — Inpatient Hospital Stay (HOSPITAL_COMMUNITY): Payer: Medicare Other | Admitting: Occupational Therapy

## 2019-12-30 ENCOUNTER — Encounter (HOSPITAL_COMMUNITY): Payer: Medicare Other | Admitting: Occupational Therapy

## 2019-12-30 MED ORDER — FAMOTIDINE 20 MG PO TABS
20.0000 mg | ORAL_TABLET | Freq: Every day | ORAL | Status: DC | PRN
  Administered 2019-12-30 – 2020-01-22 (×12): 20 mg via ORAL
  Filled 2019-12-30 (×12): qty 1

## 2019-12-30 MED ORDER — DIPHENHYDRAMINE HCL 25 MG PO CAPS
25.0000 mg | ORAL_CAPSULE | Freq: Two times a day (BID) | ORAL | Status: DC | PRN
  Administered 2019-12-30 – 2020-01-10 (×3): 25 mg via ORAL
  Filled 2019-12-30 (×5): qty 1

## 2019-12-30 MED ORDER — HYDROXYZINE HCL 25 MG PO TABS
25.0000 mg | ORAL_TABLET | Freq: Three times a day (TID) | ORAL | Status: DC | PRN
  Administered 2019-12-30 – 2020-01-22 (×20): 25 mg via ORAL
  Filled 2019-12-30 (×21): qty 1

## 2019-12-30 MED ORDER — CHLORHEXIDINE GLUCONATE CLOTH 2 % EX PADS
6.0000 | MEDICATED_PAD | Freq: Two times a day (BID) | CUTANEOUS | Status: DC
  Administered 2019-12-30 – 2020-01-08 (×8): 6 via TOPICAL

## 2019-12-30 MED ORDER — GABAPENTIN 100 MG PO CAPS
100.0000 mg | ORAL_CAPSULE | Freq: Two times a day (BID) | ORAL | Status: DC | PRN
Start: 2019-12-30 — End: 2020-01-23
  Administered 2019-12-31 – 2020-01-22 (×22): 100 mg via ORAL
  Filled 2019-12-30 (×22): qty 1

## 2019-12-30 NOTE — Progress Notes (Signed)
Subjective:  Seen in room, no c/o's.   Objective Vital signs in last 24 hours: Vitals:   12/30/19 0505 12/30/19 0618 12/30/19 0900 12/30/19 1353  BP: 133/67  129/60 127/64  Pulse: 75  79 87  Resp: 18  16 20   Temp: 98.5 F (36.9 C)  98.8 F (37.1 C) 98.4 F (36.9 C)  TempSrc:   Oral   SpO2: 92%  94%   Weight:  56.3 kg    Height:       Weight change: 1.8 kg  Physical Exam: General: alert , NAD  Heart: RRR, no mgr  Lungs: CTA Abdomen: bs +, soft , NT, ND Extremities: no pedal  Edema Dialysis Access: LUA AVF + bruit     HD orders: SW TTS  3.5h  450/600   2/2.25 bath 58.5kg  P2  LUA AVF  Hep none -Mircera 50 mcg IV q 4 weeks -Hectorol 6 mcg IV TIW -Sensipar 30 mg PO TIW   Problem/Plan: # ICH-thought secondary to fairly new Coumadin therapy. Coumadinwasheld, reversing agents given,neurologymanaged.Repeat CT of head 12/02 without evidence of extension of hematoma. Now in CIR.  #ESRD:on  TTS schedule. HD yest. Next HD Tuesday.   #Hypertension/Volume- No evidence of volume overload.UF as tol during HD.  Monitor blood pressure.  #Anemia of ESRD:Hgb  9.9 now with hgb<10 restart  Low dose Aranesp.   #Secondary hyperparathyroidism: Phosphorus level improved 4.9  Calcium 10.6  corrected   with higher dose of PhosLo.  Continue Sensipar and Hectorol.   #pulmonary HTN. Follows with cardiology at [facility]. Currently on tadalafil and Uptravi for her pulmonary hypertension.Marland Kitchen Avoid hypotension.    Kelly Splinter, MD 12/30/2019, 6:56 PM      Labs: Basic Metabolic Panel: Recent Labs  Lab 12/26/19 1430 12/29/19 1111  NA 132* 134*  K 4.4 3.9  CL 94* 100  CO2 21*  --   GLUCOSE 82 74  BUN 85* 41*  CREATININE 9.45* 6.60*  CALCIUM 10.0  --   PHOS 6.0*  --    Liver Function Tests: Recent Labs  Lab 12/26/19 1430  ALBUMIN 2.8*   No results for input(s): LIPASE, AMYLASE in the last 168 hours. No results for input(s): AMMONIA in the last 168  hours. CBC: Recent Labs  Lab 12/26/19 1429 12/27/19 0858 12/28/19 1110 12/29/19 1111  WBC 3.0* 3.3*  --   --   NEUTROABS  --  2.1  --   --   HGB 9.4* 7.0* 9.8* 12.6  HCT 28.6* 20.6* 30.1* 37.0  MCV 89.7 89.2  --   --   PLT 174 PLATELET CLUMPS NOTED ON SMEAR, COUNT APPEARS ADEQUATE  --   --    Cardiac Enzymes: No results for input(s): CKTOTAL, CKMB, CKMBINDEX, TROPONINI in the last 168 hours. CBG: No results for input(s): GLUCAP in the last 168 hours.  Studies/Results: No results found. Medications:  . ambrisentan  5 mg Oral Daily  . azelastine  1 spray Each Nare BID  . calcium acetate  1,334 mg Oral TID WC  . camphor-menthol   Topical BID  . cephALEXin  500 mg Oral Q24H  . Chlorhexidine Gluconate Cloth  6 each Topical BID  . cinacalcet  30 mg Oral Q T,Th,Sa-HD  . doxercalciferol  6 mcg Intravenous Q T,Th,Sa-HD  . feeding supplement (NEPRO CARB STEADY)  237 mL Oral BID BM  . Gerhardt's butt cream   Topical QID  . hydrocerin   Topical BID  . hydrocortisone  25 mg Rectal BID  .  loratadine  10 mg Oral Daily  . oxymetazoline  1 spray Each Nare BID  . pentoxifylline  400 mg Oral Daily  . psyllium  1 packet Oral BID  . Selexipag  800 mcg Oral BID  . sodium chloride  1 spray Each Nare 5 X Daily  . tadalafil  20 mg Oral Daily

## 2019-12-30 NOTE — Progress Notes (Signed)
Subjective: Resting in bed. No significant bleeding since surgery.  Objective: Vital signs in last 24 hours: Temp:  [97.6 F (36.4 C)-98.8 F (37.1 C)] 98.8 F (37.1 C) (12/18 1917) Pulse Rate:  [76-86] 82 (12/18 1917) Resp:  [17-21] 18 (12/18 1917) BP: (105-128)/(55-69) 125/60 (12/18 1917) SpO2:  [93 %-100 %] 96 % (12/18 1917) Weight:  [53.5 kg-55.5 kg] 53.5 kg (12/18 1821)   Physical Exam: General appearance:alert, cooperative and no distress Head:Normocephalic, without obvious abnormality, atraumatic Eyes: Pupils are equal, round, reactive to light. Extraocular motion is intact.  Ears: Examination of the ears shows normal auricles and external auditory canals bilaterally. Nose:Merocel packing in place.No bleeding is noted. Face: Facial examination shows no asymmetry. Palpation of the face elicit no significant tenderness.  Mouth: Oral cavity examination shows no mucosal lacerations. No significant trismus is noted.  Neck: Palpation of the neck reveals no lymphadenopathy or mass. The trachea is midline.   Recent Labs    12/27/19 0858 12/28/19 1110 12/29/19 1111  WBC 3.3*  --   --   HGB 7.0* 9.8* 12.6  HCT 20.6* 30.1* 37.0  PLT PLATELET CLUMPS NOTED ON SMEAR, COUNT APPEARS ADEQUATE  --   --    Recent Labs    12/29/19 1111  NA 134*  K 3.9  CL 100  GLUCOSE 74  BUN 41*  CREATININE 6.60*    Medications:  I have reviewed the patient's current medications. Scheduled: . ambrisentan  5 mg Oral Daily  . azelastine  1 spray Each Nare BID  . calcium acetate  1,334 mg Oral TID WC  . camphor-menthol   Topical BID  . cephALEXin  500 mg Oral Q24H  . Chlorhexidine Gluconate Cloth  6 each Topical BID  . cinacalcet  30 mg Oral Q T,Th,Sa-HD  . diphenhydrAMINE  25 mg Oral Q12H  . doxercalciferol  6 mcg Intravenous Q T,Th,Sa-HD  . feeding supplement (NEPRO CARB STEADY)  237 mL Oral BID BM  . Gerhardt's butt cream   Topical QID  . hydrocerin   Topical BID  .  hydrocortisone  25 mg Rectal BID  . loratadine  10 mg Oral Daily  . oxymetazoline  1 spray Each Nare BID  . pentoxifylline  400 mg Oral Daily  . psyllium  1 packet Oral BID  . Selexipag  800 mcg Oral BID  . sodium chloride  1 spray Each Nare 5 X Daily  . tadalafil  20 mg Oral Daily   Continuous:   Assessment/Plan: Recurrent epistaxis, s/p bilateral nasal cautery in the OR. - No more bleeding. - Will remove packing tomorrow.   LOS: 12 days   Arlicia Paquette W Ambra Haverstick 12/30/2019, 4:46 AM

## 2019-12-30 NOTE — Progress Notes (Signed)
Pt she felt a drip in back of throat and Y[place]auer had bloody present. Visual inspection done with penlight. There was fresh frank blood present on the patient's tongue but no obvious drainage in the back of the throat. Pt was given ice water to rinse her mouth. All tubing and canister were changed. Pt suctioned mouth no blood present at this time. Packing in nares intact no drainage present at this time. Pt states no nausea at this time will reassess in thirty minutes

## 2019-12-30 NOTE — Progress Notes (Signed)
Occupational Therapy Session Note  Patient Details  Name: Katie Nunez MRN: [id number] Date of Birth: 07-25-1955  Today's Date: 12/30/2019 OT Group Time: 1130-1200 OT Group Time Calculation (min): 30 min   Skilled Therapeutic Interventions/Progress Updates:    Pt engaged in therapeutic w/c level dance group focusing on patient choice, UE/LE strengthening, salience, activity tolerance, and social participation. Pt was guided through various dance-based exercises involving UEs/LEs and trunk. All music was selected by group members. Emphasis placed on Rt attention and self ROM. Vcs for self ROM of the affected LE, assistance for setup of the Rt arm for shoulder ROM, visual and demonstrational cues for distal UE ROM. At end of session pt was returned to the room via w/c. Left her sitting up in the w/c with safety belt fastened, all needs within reach, satting at 96% on RA. Per RN, since her recent nasal sx she no longer needs supplemental 02.   30 minutes missed initially due to nursing care   Therapy Documentation Precautions:  Precautions Precautions: Fall Precaution Comments: Rt hemiplegia - subluxation Restrictions Weight Bearing Restrictions: No Vital Signs: Therapy Vitals Temp: 98.4 F (36.9 C) Pulse Rate: 87 Resp: 20 BP: 127/64 Patient Position (if appropriate): Sitting Oxygen Therapy O2 Device: Nasal Cannula Pain: no c/o pain during tx, no s/s SOB ADL: ADL Eating: Set up Grooming: Minimal assistance Upper Body Bathing: Moderate assistance Where Assessed-Upper Body Bathing: Chair Lower Body Bathing: Maximal assistance Where Assessed-Lower Body Bathing: Chair Upper Body Dressing: Maximal assistance Where Assessed-Upper Body Dressing: Chair Lower Body Dressing: Dependent Where Assessed-Lower Body Dressing: Chair Toileting: Dependent Where Assessed-Toileting: Glass blower/designer: Moderate assistance Toilet Transfer Method: Other (comment) (stedy)       Therapy/Group: Group Therapy  Rechy Bost A Jorel Gravlin 12/30/2019, 4:04 PM

## 2019-12-30 NOTE — Progress Notes (Signed)
[facility] PHYSICAL MEDICINE & REHABILITATION PROGRESS NOTE   Subjective/Complaints:  Pt reports less bleeding- has packing in nares B/L- Dr Benjamine Mola said will likely remove in AM.   Having twitches/sharp nerve pains in LEs- intermittent- asking for home dose of Gabapentin 100 mg BID prn for Sx's- I agreed- since dose is OK with HD- will order.   Notes her weight is at "college level".   Also concerned about "warts' on her knuckles.   ROS:    Pt denies SOB, abd pain, CP, N/V/C/D, and vision changes  Objective:   No results found. Recent Labs    12/28/19 1110 12/29/19 1111  HGB 9.8* 12.6  HCT 30.1* 37.0   Recent Labs    12/29/19 1111  NA 134*  K 3.9  CL 100  GLUCOSE 74  BUN 41*  CREATININE 6.60*    Intake/Output Summary (Last 24 hours) at 12/30/2019 1056 Last data filed at 12/30/2019 1006 Gross per 24 hour  Intake 356 ml  Output 2008 ml  Net -1652 ml     Pressure Injury 12/18/19 Sacrum Medial Stage 1 -  Intact skin with non-blanchable redness of a localized area usually over a bony prominence. Red, non-blanchable spot over sacrum area (Active)  12/18/19 1707  Location: Sacrum  Location Orientation: Medial  Staging: Stage 1 -  Intact skin with non-blanchable redness of a localized area usually over a bony prominence.  Wound Description (Comments): Red, non-blanchable spot over sacrum area  Present on Admission: Yes    Physical Exam: Constitutional: awake, alert, appropriate, sitting up in bed, NAD HEENT: EOMI, oral membranes moist, BL nares packed Neck: supple Cardiovascular: RRR Respiratory/Chest: CTA B/L- no W/R/R- good air movement GI/Abdomen:Soft, NT, ND, (+)BS  Ext: no clubbing, cyanosis, or edema- has lesions that look like warts? On knuckles- not open-  Psych: pleasant and cooperative Skin:   LUE fistula intact. Left calf wound dressed. mild sacral redness present Neuro: Pt is cognitively appropriate with normal insight, memory, and awareness.  Cranial nerves 2-12 are intact. Seems to sense pain and LT in all 4's. Reflexes are 2+ on left. 3+ right. Fine motor coordination is intact. No tremors. Motor function is grossly 5/5 LUE and LLE.  RUE  0/5.  RLE tr-1 Hamstrings and HAD, otherwise 0/5 Musculoskeletal: minimal jt tenderness today   Vital Signs Blood pressure 129/60, pulse 79, temperature 98.8 F (37.1 C), temperature source Oral, resp. rate 16, height 5\' 5"  (1.651 m), weight 56.3 kg, last menstrual period 11/15/2008, SpO2 94 %.  Assessment/Plan: 1. Functional deficits which require 3+ hours per day of interdisciplinary therapy in a comprehensive inpatient rehab setting.  Physiatrist is providing close team supervision and 24 hour management of active medical problems listed below.  Physiatrist and rehab team continue to assess barriers to discharge/monitor patient progress toward functional and medical goals  Care Tool:  Bathing    Body parts bathed by patient: Chest,Abdomen,Right upper leg,Left upper leg,Right arm,Face   Body parts bathed by helper: Left arm,Front perineal area,Buttocks,Right lower leg,Left lower leg     Bathing assist Assist Level: Moderate Assistance - Patient 50 - 74%     Upper Body Dressing/Undressing Upper body dressing   What is the patient wearing?: Pull over shirt    Upper body assist Assist Level: Moderate Assistance - Patient 50 - 74%    Lower Body Dressing/Undressing Lower body dressing      What is the patient wearing?: Pants     Lower body assist Assist for lower  body dressing: Total Assistance - Patient < 25%     Toileting Toileting    Toileting assist Assist for toileting: Total Assistance - Patient < 25%     Transfers Chair/bed transfer  Transfers assist     Chair/bed transfer assist level: Moderate Assistance - Patient 50 - 74%     Locomotion Ambulation   Ambulation assist      Assist level: Total Assistance - Patient < 25% Assistive device: Lite  Gait Max distance: 9'   Walk 10 feet activity   Assist     Assist level: 2 helpers Assistive device: Hand held assist   Walk 50 feet activity   Assist Walk 50 feet with 2 turns activity did not occur: Safety/medical concerns         Walk 150 feet activity   Assist Walk 150 feet activity did not occur: Safety/medical concerns         Walk 10 feet on uneven surface  activity   Assist Walk 10 feet on uneven surfaces activity did not occur: Safety/medical concerns         Wheelchair     Assist Will patient use wheelchair at discharge?: Yes Type of Wheelchair: Manual Wheelchair activity did not occur: Safety/medical concerns         Wheelchair 50 feet with 2 turns activity    Assist    Wheelchair 50 feet with 2 turns activity did not occur: Safety/medical concerns       Wheelchair 150 feet activity     Assist  Wheelchair 150 feet activity did not occur: Safety/medical concerns       Blood pressure 129/60, pulse 79, temperature 98.8 F (37.1 C), temperature source Oral, resp. rate 16, height 5\' 5"  (1.651 m), weight 56.3 kg, last menstrual period 11/15/2008, SpO2 94 %.  Medical Problem List and Plan: 1.  L frontal ICH secondary to recent starting of Coumadin- Coumadin on hold with R hemiplegia             -patient may shower             -ELOS/Goals: 12/31  --Continue CIR therapies including PT, OT, and SLP.    2.  Portal vein thrombosis/A fib/Antithrombotics: -DVT/anticoagulation:  Pharmaceutical: SCDs due to thrombocytopenia and recent bleed.  --per neuro no coumadin-->will need to wait at least one month due to size of bleed.  --dopplers ok             -antiplatelet therapy: N/A 3. Pain Management: tylenol prn.   - kpad for neck pain  -pt to work with pt today, might benefit from massage, K-tape  -will replace robaxin with flexeril prn  -may have tramadol q8 prn  -12/17 right shoulder/neck pain still an issue off an on. Would  like 50mg  tramdol   -will increase tramadol to 50mg  q8 prn  12/18- pain controlled- con't regimen 4. Mood: LCSW to follow up for evaluation and support.              -antipsychotic agents: N/A  -add trazodone qhs prn for sleep 5. Neuropsych: This patient is capable of making decisions on her own behalf. 6. Wound ulcer/Skin/Wound Care:Pack daily wound care LLE.    -turning, nutrition, education  12/14 -added scheduled benadryl for itching. Also receiving sarna        -prn vistaril 7. Fluids/Electrolytes/Nutrition: Strict I/O.  12/14: labs with HD TTS 8. ESRD: Continue HD on TTS at the end of the day to help with therapy tolerance.  9. HTN: Monitor BP tid-currently controlled on current regimen 10. Pulmonary HTN: Oxygen dependent. Continue Ambrisentan and Selexipag. On Home dose 2L by [place] 11. Cirrhosis of the liver: Monitor platelets and for any signs of bleeding. Reporting abdominal distension.  (protal vein thrombus)  -f/u abd CT as outpt 12. Chronic systolic CHF: Monitor daily weights as well as I/O. Continue tadalafil.    Filed Weights   12/29/19 1430 12/29/19 1821 12/30/19 0618  Weight: 55.5 kg 53.5 kg 56.3 kg    -volume mgt per nephro 13. CAF: Monitor HR tid--metoprolol has been on hold. Coumadin to be held due to [place].  14. Pancytopenia:  Continue to monitor with serial checks.   12/17 hgb down to 7.0 yesterday. Doesn't appear to be symptomatic    -recheck today  12/18- waiting on labs- will recheck Sunday of need be.   15. Constipation WITH hemorrhoids- seems to cycle between loose stool  -prn miralax only  12/13 pt with mushy,loose stools yet seems to report constipation   -backed of scheduled senna-s for now   -added daily fiber packet  -encouraging regular solid/liquid intake as well  -stopped amitiza 12/14  -resumed senna-s at bedtime 12/15 16. Significant epistaxis (pt has prior history)  12/16 -recurrent bleeding in right nose---stopped with rocket   -ENT Benjamine Mola) is  planning on potential cauterization this weekend  12/18- went to OR this AM for this. Hopefully resolved.  17. Insomnia:   12/17: pt wants to try 50mg  trazodone at this time 18. Right neck nodule appears resolved. Likely node    LOS: 12 days A FACE TO FACE EVALUATION WAS PERFORMED  Zeah Germano 12/30/2019, 10:56 AM

## 2019-12-31 ENCOUNTER — Inpatient Hospital Stay (HOSPITAL_COMMUNITY): Payer: Medicare Other | Admitting: Occupational Therapy

## 2019-12-31 ENCOUNTER — Inpatient Hospital Stay (HOSPITAL_COMMUNITY): Payer: Medicare Other

## 2019-12-31 NOTE — Progress Notes (Signed)
Subjective: No bleeding overnight. No new c/o.  Objective: Vital signs in last 24 hours: Temp:  [98 F (36.7 C)-98.4 F (36.9 C)] 98 F (36.7 C) (12/20 0623) Pulse Rate:  [80-87] 84 (12/20 0623) Resp:  [17-20] 17 (12/20 0623) BP: (127-137)/(64-76) 137/76 (12/20 0623) SpO2:  [92 %-95 %] 95 % (12/20 0623) Weight:  [56 kg] 56 kg (12/20 4235)  Physical Exam: General appearance:alert, cooperative and no distress Head:Normocephalic, without obvious abnormality, atraumatic Eyes: Pupils are equal, round, reactive to light. Extraocular motion is intact.  Ears: Examination of the ears shows normal auricles and external auditory canals bilaterally. Nose:Merocel packing removed.No bleeding is noted. Face: Facial examination shows no asymmetry. Palpation of the face elicit no significant tenderness.  Mouth: Oral cavity examination shows no mucosal lacerations. No significant trismus is noted.  Neck: Palpation of the neck reveals no lymphadenopathy or mass. The trachea is midline.   Recent Labs    12/28/19 1110 12/29/19 1111  HGB 9.8* 12.6  HCT 30.1* 37.0   Recent Labs    12/29/19 1111  NA 134*  K 3.9  CL 100  GLUCOSE 74  BUN 41*  CREATININE 6.60*    Medications:  I have reviewed the patient's current medications. Scheduled: . ambrisentan  5 mg Oral Daily  . azelastine  1 spray Each Nare BID  . calcium acetate  1,334 mg Oral TID WC  . camphor-menthol   Topical BID  . cephALEXin  500 mg Oral Q24H  . Chlorhexidine Gluconate Cloth  6 each Topical BID  . cinacalcet  30 mg Oral Q T,Th,Sa-HD  . doxercalciferol  6 mcg Intravenous Q T,Th,Sa-HD  . feeding supplement (NEPRO CARB STEADY)  237 mL Oral BID BM  . Gerhardt's butt cream   Topical QID  . hydrocerin   Topical BID  . hydrocortisone  25 mg Rectal BID  . loratadine  10 mg Oral Daily  . oxymetazoline  1 spray Each Nare BID  . pentoxifylline  400 mg Oral Daily  . psyllium  1 packet Oral BID  . Selexipag  800 mcg Oral  BID  . sodium chloride  1 spray Each Nare 5 X Daily  . tadalafil  20 mg Oral Daily   Continuous:   Assessment/Plan: Recurrent epistaxis, s/p bilateral nasal cautery in the OR 2 days ago. - No more bleeding. - Packing removed.    LOS: 13 days   Adarius Tigges W Ryder Chesmore 12/31/2019, 10:02 AM

## 2019-12-31 NOTE — Progress Notes (Signed)
Occupational Therapy Session Note  Patient Details  Name: Katie Nunez MRN: [id number] Date of Birth: Oct 17, 1955  Today's Date: 12/31/2019  Session 1 OT Individual Time: 5883-2549 OT Individual Time Calculation (min): 57 min   Session 2 OT Individual Time: 1245-1345 OT Individual Time Calculation (min): 60 min    Short Term Goals: Week 2:  OT Short Term Goal 1 (Week 2): Pt will don pants over feet with mod A. OT Short Term Goal 2 (Week 2): Pt will stand at toilet with mod A of 1. OT Short Term Goal 3 (Week 2): Patient will reposition R UE prior to transfer with min questioning cues OT Short Term Goal 4 (Week 2): Pt will complete self ROM with min cues  Skilled Therapeutic Interventions/Progress Updates:  Session 1   Patient greeted seated EOB eating breakfast and agreeable to OT treatment session. Pt needed min set-up A for self-feeding with L hand. Pt declined to shower, but wanted to bathe upper body at EOB with hand over hand A to integrate R UE- trace shoulder and scapula activation noted. Blocked practice for UB and LB dressing at EOB. OT educated on hemi dressing techniques and had pt practice in different positions at EOB. Pt needed OT assist to get R LE into figure 4 position, then pt was able to assist with threading R pant leg. Pt able to don socks with min A using hemitechniques as well. Sit<>stand with min A, then mod A for balance when pulling up pants. Blocked practice for UB dressing. Pt practiced hemi technique 2x with improved recall and understanding on 2nd attempt. Stand-pivot to wc with Min A today. Pt brought to the sink and completed one handed grooming tasks with min A. OT placed SAEBO e-stim on shoulder fo subluxation support. SAEBO left on for 60 minutes. OT returned to remove SAEBO with skin intact and no adverse reactions.  Sa[place]o Stim One 330 pulse width 35 Hz pulse rate On 8 sec/ off 8 sec Ramp up/ down 2 sec Symmetrical Biphasic wave form  Max intensity  192m at 500 Ohm load  Pt left seated in wc with chair alarm on, call bell in reach, and needs met.   Session 2 Patient greeted seated in recliner finshing eating lunch and agreeable to OT treatment session. Pt completed stand-pivot from recliner to wc with min A to stand and mod A to pivot. Educated on hemi technique for propelling wc with pt demonstrating understanding using small obstacle course. Squat-pivot to therapy mat on stronger L side with min A and verbal cues for technique. R UE NMR in gravity eliminated sidelying position. Focus on shoulder/elbow ROM. Pt with trace shoulder/scap activation. Pt was also able to active triceps to initiate trace elbow extension. Pt returned to room and completed stand-pivot back to recliner with mod A. Pt left with chair alarm on, call bell in reach, and needs met.  Therapy Documentation Precautions:  Precautions Precautions: Fall Precaution Comments: Rt hemiplegia - subluxation Restrictions Weight Bearing Restrictions: No General:   Vital Signs: Therapy Vitals Temp: 98.3 F (36.8 C) Pulse Rate: 79 Resp: 18 BP: 133/68 Patient Position (if appropriate): Sitting Oxygen Therapy SpO2: 93 % O2 Device: Room Air Pain:  denies pain  Therapy/Group: Individual Therapy  EValma Cava12/20/2021, 2:39 PM

## 2019-12-31 NOTE — Progress Notes (Signed)
Physical Therapy Session Note  Patient Details  Name: Katie Nunez MRN: [id number] Date of Birth: [birth year]  Today's Date: 12/31/2019 PT Individual Time: [id number] and 1431-1459 PT Individual Time Calculation (min): 57 min and 28 min  Short Term Goals: Week 2:  PT Short Term Goal 1 (Week 2): Pt will perform bed mobility with minA. PT Short Term Goal 2 (Week 2): Pt will perform sit to stand with minA. PT Short Term Goal 3 (Week 2): Pt will perform bed to chair transfer with minA. PT Short Term Goal 4 (Week 2): Pt will ambulates 38' with modA.  Skilled Therapeutic Interventions/Progress Updates:      1st Session: Pt received seated in [facility] and agrees to therapy. No complaint of pain. WC transport to gym for time management. Focus of session NMR for R leg. Pt performs multiple reps of sit to stan with HHA modA. Mirror positioned for visual feedback. Initially pt constructs block structure with L hand while standing with modA. PT blocking R knee and tactile facilitation of hamstring activation and knee extension. Pt performs targeted stepping with L leg, PT facilitating lateral weight shifting and ongoing blocking of R knee. Pt performs pre-gait type activity, stepping forward with hemiparetic R leg with PT assistance, shifting weight into R hemibody, and swinging through with L leg, then repeating in backward motion. Pt demos some activation of R hip flexors, abductors/adductors, and hip extensors, minimal knee extension noted. Pt transfers to mat table wth modA and performs supine therex for R leg, including: 1x10 hip abduction/adduction 1x10 heel slides 3x10 supine bridging with both legs, with PT using towel to facilitate hip extension and manually facilitating R hemibody WB through distal R thigh. Supine to sit with CGA and increased time. Stand pivot back to WC with modA. Pt left seated in WC with alarm intact and all needs within reach.  2nd Session: Pt received asleep in recliner and  agrees to therapy. No complaint of pain.Squat pivot transfer to [facility] with modA, going toward the R. WC transport to gym for time management. PT sizes RW for pt's height and provides R hand splint with cushion. Pt performs sit to stand with modA, ambulating 25' with RW and maxA. PT manually progresses pt's R leg during swing phase and blocks knee during stance phase, also facilitating lateral weight shifting. Pt has tendency to Push to the R in standing, especially with fatigue.Pt transported back to room. Stand pivot back to recliner with modA. Left seated in recliner with a alarm intact and all needs within reach.  Therapy Documentation Precautions:  Precautions Precautions: Fall Precaution Comments: Rt hemiplegia - subluxation Restrictions Weight Bearing Restrictions: No    Therapy/Group: Individual Therapy  Breck Coons, PT, DPT 12/31/2019, 3:09 PM

## 2019-12-31 NOTE — Progress Notes (Signed)
  [facility] Progress Note   Assessment/ Plan:   HD orders: SW TTS  3.5h  450/600   2/2.25 bath 58.5kg  P2  LUA AVF  Hep none -Mircera 50 mcg IV q 4 weeks -Hectorol 6 mcg IV TIW -Sensipar 30 mg PO TIW   Problem/Plan: # ICH-thought secondary to fairly new Coumadin therapy. Coumadinwasheld, reversing agents given,neurologymanaged.Repeat CT of head 12/02 without evidence of extension of hematoma. Now in CIR.  #ESRD:on  TTS schedule.HD yest. Next HD Tuesday.   #Hypertension/Volume- No evidence of volume overload.UF as tol during HD.Monitor blood pressure.  #Anemia of ESRD:Hgb  9.9 now with hgb<10 restart  Low dose Aranesp.  #Secondary hyperparathyroidism: Phosphorus level improved 4.9  Calcium 10.6  corrected   with higher dose of PhosLo. Continue Sensipar and Hectorol.   #pulmonary HTN. Follows with cardiology at [facility]. Currently on tadalafil and Uptravi for her pulmonary hypertension.Marland Kitchen Avoid hypotension.   Subjective:    Seen in room.  No issues today.  Working with PT/OT   Objective:   BP 137/76   Pulse 84   Temp 98 F (36.7 C)   Resp 17   Ht 5\' 5"  (1.651 m)   Wt 56 kg   LMP 11/15/2008   SpO2 95%   BMI 20.54 kg/m   Physical Exam: Gen: NAD, sitting in bed CVS: RRR no m/r/g Resp: clear bilaterally  Abd: soft Ext: no LE edema ACCESS: L AVF  Labs: BMET Recent Labs  Lab 12/26/19 1430 12/29/19 1111  NA 132* 134*  K 4.4 3.9  CL 94* 100  CO2 21*  --   GLUCOSE 82 74  BUN 85* 41*  CREATININE 9.45* 6.60*  CALCIUM 10.0  --   PHOS 6.0*  --    CBC Recent Labs  Lab 12/26/19 1429 12/27/19 0858 12/28/19 1110 12/29/19 1111  WBC 3.0* 3.3*  --   --   NEUTROABS  --  2.1  --   --   HGB 9.4* 7.0* 9.8* 12.6  HCT 28.6* 20.6* 30.1* 37.0  MCV 89.7 89.2  --   --   PLT 174 PLATELET CLUMPS NOTED ON SMEAR, COUNT APPEARS ADEQUATE  --   --       Medications:    . ambrisentan  5 mg Oral Daily  . azelastine  1 spray Each Nare BID   . calcium acetate  1,334 mg Oral TID WC  . camphor-menthol   Topical BID  . Chlorhexidine Gluconate Cloth  6 each Topical BID  . cinacalcet  30 mg Oral Q T,Th,Sa-HD  . doxercalciferol  6 mcg Intravenous Q T,Th,Sa-HD  . feeding supplement (NEPRO CARB STEADY)  237 mL Oral BID BM  . Gerhardt's butt cream   Topical QID  . hydrocerin   Topical BID  . hydrocortisone  25 mg Rectal BID  . loratadine  10 mg Oral Daily  . oxymetazoline  1 spray Each Nare BID  . pentoxifylline  400 mg Oral Daily  . psyllium  1 packet Oral BID  . Selexipag  800 mcg Oral BID  . sodium chloride  1 spray Each Nare 5 X Daily  . tadalafil  20 mg Oral Daily     Madelon Lips, MD 12/31/2019, 11:09 AM

## 2019-12-31 NOTE — Progress Notes (Signed)
Late note for documentation: Katie Sabin NP with neurology followed up to report that she had discussed patient with Dr. Erlinda Hong and to confirm that [facility] to be held till follow up in office. Informed her of issues with nose bleeds and she reported that this confirms their recommendations.   Patient has been concerned about having AC held--above recommendations as well as mortality risk with ICH v/s thrombotic strokes.

## 2019-12-31 NOTE — Progress Notes (Signed)
[facility] PHYSICAL MEDICINE & REHABILITATION PROGRESS NOTE   Subjective/Complaints:  Anxious to get nasal packing out. Started on gabapentin for "spasms/shooting pain" which helped!  ROS: Patient denies fever, rash, sore throat, blurred vision, nausea, vomiting, diarrhea, cough, shortness of breath or chest pain,  headache, or mood change.    Objective:   No results found. Recent Labs    12/28/19 1110 12/29/19 1111  HGB 9.8* 12.6  HCT 30.1* 37.0   Recent Labs    12/29/19 1111  NA 134*  K 3.9  CL 100  GLUCOSE 74  BUN 41*  CREATININE 6.60*    Intake/Output Summary (Last 24 hours) at 12/31/2019 1601 Last data filed at 12/30/2019 2020 Gross per 24 hour  Intake 360 ml  Output --  Net 360 ml     Pressure Injury 12/18/19 Sacrum Medial Stage 1 -  Intact skin with non-blanchable redness of a localized area usually over a bony prominence. Red, non-blanchable spot over sacrum area (Active)  12/18/19 1707  Location: Sacrum  Location Orientation: Medial  Staging: Stage 1 -  Intact skin with non-blanchable redness of a localized area usually over a bony prominence.  Wound Description (Comments): Red, non-blanchable spot over sacrum area  Present on Admission: Yes    Physical Exam: Constitutional: No distress . Vital signs reviewed. HEENT: EOMI, oral membranes moist, nares packed Neck: supple Cardiovascular: RRR without murmur. No JVD    Respiratory/Chest: CTA Bilaterally without wheezes or rales. Normal effort    GI/Abdomen: BS +, non-tender, non-distended Ext: no clubbing, cyanosis, or edema Psych: pleasant and cooperative Skin:   LUE fistula intact. Left calf wound dressed. mild sacral redness present Neuro: Pt is cognitively appropriate with normal insight, memory, and awareness. Cranial nerves 2-12 are intact. Seems to sense pain and LT in all 4's. Reflexes are 2+ on left. 3+ right. Fine motor coordination is intact. No tremors. Motor function is grossly 5/5 LUE and  LLE.  RUE  0/5 (?tr delt).  RLE 1 Hamstrings and HAD, otherwise 0/5 distally Musculoskeletal: minimal jt tenderness today   Vital Signs Blood pressure 137/76, pulse 84, temperature 98 F (36.7 C), resp. rate 17, height 5\' 5"  (1.651 m), weight 56 kg, last menstrual period 11/15/2008, SpO2 95 %.  Assessment/Plan: 1. Functional deficits which require 3+ hours per day of interdisciplinary therapy in a comprehensive inpatient rehab setting.  Physiatrist is providing close team supervision and 24 hour management of active medical problems listed below.  Physiatrist and rehab team continue to assess barriers to discharge/monitor patient progress toward functional and medical goals  Care Tool:  Bathing    Body parts bathed by patient: Chest,Abdomen,Right upper leg,Left upper leg,Right arm,Face   Body parts bathed by helper: Left arm,Front perineal area,Buttocks,Right lower leg,Left lower leg     Bathing assist Assist Level: Moderate Assistance - Patient 50 - 74%     Upper Body Dressing/Undressing Upper body dressing   What is the patient wearing?: Pull over shirt    Upper body assist Assist Level: Moderate Assistance - Patient 50 - 74%    Lower Body Dressing/Undressing Lower body dressing      What is the patient wearing?: Pants     Lower body assist Assist for lower body dressing: Total Assistance - Patient < 25%     Toileting Toileting    Toileting assist Assist for toileting: Total Assistance - Patient < 25%     Transfers Chair/bed transfer  Transfers assist     Chair/bed transfer assist  level: Moderate Assistance - Patient 50 - 74%     Locomotion Ambulation   Ambulation assist      Assist level: Total Assistance - Patient < 25% Assistive device: Lite Gait Max distance: 9'   Walk 10 feet activity   Assist     Assist level: 2 helpers Assistive device: Hand held assist   Walk 50 feet activity   Assist Walk 50 feet with 2 turns activity did not  occur: Safety/medical concerns         Walk 150 feet activity   Assist Walk 150 feet activity did not occur: Safety/medical concerns         Walk 10 feet on uneven surface  activity   Assist Walk 10 feet on uneven surfaces activity did not occur: Safety/medical concerns         Wheelchair     Assist Will patient use wheelchair at discharge?: Yes Type of Wheelchair: Manual Wheelchair activity did not occur: Safety/medical concerns         Wheelchair 50 feet with 2 turns activity    Assist    Wheelchair 50 feet with 2 turns activity did not occur: Safety/medical concerns       Wheelchair 150 feet activity     Assist  Wheelchair 150 feet activity did not occur: Safety/medical concerns       Blood pressure 137/76, pulse 84, temperature 98 F (36.7 C), resp. rate 17, height 5\' 5"  (1.651 m), weight 56 kg, last menstrual period 11/15/2008, SpO2 95 %.  Medical Problem List and Plan: 1.  L frontal ICH secondary to recent starting of Coumadin- Coumadin on hold with R hemiplegia             -patient may shower             -ELOS/Goals: 12/31  --Continue CIR therapies including PT, OT, and SLP.    2.  Portal vein thrombosis/A fib/Antithrombotics: -DVT/anticoagulation:  Pharmaceutical: SCDs due to thrombocytopenia and recent bleed.  --per neuro no coumadin-->will need to wait at least one month due to size of bleed.  --dopplers ok             -antiplatelet therapy: N/A 3. Pain Management: tylenol prn.   - kpad for neck pain  -will replace robaxin with flexeril prn  -may have tramadol q8 prn  -12/17 right shoulder/neck pain still an issue off an on. Would like 50mg  tramadol   -will increase tramadol to 50mg  q8 prn  12/20- pain controlled- con't regimen   -gabapentin 100mg  bid prn initiated this weekend with results 4. Mood: LCSW to follow up for evaluation and support.              -antipsychotic agents: N/A  -add trazodone qhs prn for sleep 5.  Neuropsych: This patient is capable of making decisions on her own behalf. 6. Wound ulcer/Skin/Wound Care:Pack daily wound care LLE.    -turning, nutrition, education  12/14 -added scheduled benadryl for itching. Also receiving sarna        -prn vistaril 7. Fluids/Electrolytes/Nutrition: Strict I/O.  12/14: labs with HD TTS 8. ESRD: Continue HD on TTS at the end of the day to help with therapy tolerance.  9. HTN: Monitor BP tid-currently controlled on current regimen 10. Pulmonary HTN: Oxygen dependent. Continue Ambrisentan and Selexipag. On Home dose 2L by [place] 11. Cirrhosis of the liver: Monitor platelets and for any signs of bleeding. Reporting abdominal distension.  (protal vein thrombus)  -f/u abd CT as  outpt 12. Chronic systolic CHF: Monitor daily weights as well as I/O. Continue tadalafil.    Filed Weights   12/29/19 1821 12/30/19 0618 12/31/19 0623  Weight: 53.5 kg 56.3 kg 56 kg    -volume mgt per nephro 13. CAF: Monitor HR tid--metoprolol has been on hold. Coumadin to be held due to [place].  14. Pancytopenia:  Continue to monitor with serial checks.   12/20--inconsistent H&H levels--up to 12 on 12/18!  15. Constipation WITH hemorrhoids- seems to cycle between loose stool  -prn miralax only  12/13 pt with mushy,loose stools yet seems to report constipation   -backed of scheduled senna-s for now   -added daily fiber packet  -encouraging regular solid/liquid intake as well  -stopped amitiza 12/14  -resumed senna-s at bedtime 12/15 16. Significant epistaxis (pt has prior history)  12/16 -recurrent bleeding in right nose---stopped with rocket   -ENT Benjamine Mola) is planning on potential cauterization this weekend  12/18-Dr. Benjamine Mola cauterized hypervascular areas in nasal septi  12/20-packing out today 17. Insomnia:   12/20: continue 50mg  trazodone qhs prn 18. Right neck nodule appears resolved. Likely node    LOS: 13 days A FACE TO FACE EVALUATION WAS PERFORMED  Meredith Staggers 12/31/2019, 9:58 AM

## 2020-01-01 ENCOUNTER — Inpatient Hospital Stay (HOSPITAL_COMMUNITY): Payer: Medicare Other

## 2020-01-01 ENCOUNTER — Inpatient Hospital Stay (HOSPITAL_COMMUNITY): Payer: Medicare Other | Admitting: Occupational Therapy

## 2020-01-01 ENCOUNTER — Inpatient Hospital Stay (HOSPITAL_COMMUNITY): Payer: Medicare Other | Admitting: Speech Pathology

## 2020-01-01 LAB — RENAL FUNCTION PANEL
Albumin: 3 g/dL — ABNORMAL LOW (ref 3.5–5.0)
Anion gap: 16 — ABNORMAL HIGH (ref 5–15)
BUN: 54 mg/dL — ABNORMAL HIGH (ref 8–23)
CO2: 24 mmol/L (ref 22–32)
Calcium: 9.8 mg/dL (ref 8.9–10.3)
Chloride: 95 mmol/L — ABNORMAL LOW (ref 98–111)
Creatinine, Ser: 8.44 mg/dL — ABNORMAL HIGH (ref 0.44–1.00)
GFR, Estimated: 5 mL/min — ABNORMAL LOW (ref >60)
Glucose, Bld: 87 mg/dL (ref 70–99)
Phosphorus: 5.1 mg/dL — ABNORMAL HIGH (ref 2.5–4.6)
Potassium: 3.8 mmol/L (ref 3.5–5.1)
Sodium: 135 mmol/L (ref 135–145)

## 2020-01-01 LAB — CBC
HCT: 29.7 % — ABNORMAL LOW (ref 36.0–46.0)
Hemoglobin: 9.6 g/dL — ABNORMAL LOW (ref 12.0–15.0)
MCH: 29.3 pg (ref 26.0–34.0)
MCHC: 32.3 g/dL (ref 30.0–36.0)
MCV: 90.5 fL (ref 80.0–100.0)
Platelets: 163 10*3/uL (ref 150–400)
RBC: 3.28 MIL/uL — ABNORMAL LOW (ref 3.87–5.11)
RDW: 15.9 % — ABNORMAL HIGH (ref 11.5–15.5)
WBC: 3.4 10*3/uL — ABNORMAL LOW (ref 4.0–10.5)
nRBC: 0 % (ref 0.0–0.2)

## 2020-01-01 MED ORDER — AZELASTINE HCL 0.1 % NA SOLN
1.0000 | Freq: Two times a day (BID) | NASAL | Status: DC | PRN
  Administered 2020-01-05 – 2020-01-18 (×3): 1 via NASAL
  Filled 2020-01-01: qty 30

## 2020-01-01 MED ORDER — OXYMETAZOLINE HCL 0.05 % NA SOLN
1.0000 | Freq: Two times a day (BID) | NASAL | Status: DC | PRN
  Administered 2020-01-03 – 2020-01-08 (×2): 1 via NASAL
  Filled 2020-01-01: qty 30

## 2020-01-01 MED ORDER — CINACALCET HCL 30 MG PO TABS
ORAL_TABLET | ORAL | Status: AC
  Administered 2020-01-01: 30 mg via ORAL
  Filled 2020-01-01: qty 1

## 2020-01-01 MED ORDER — DOXERCALCIFEROL 4 MCG/2ML IV SOLN
INTRAVENOUS | Status: AC
  Administered 2020-01-01: 6 ug via INTRAVENOUS
  Filled 2020-01-01: qty 4

## 2020-01-01 NOTE — Patient Care Conference (Signed)
Inpatient RehabilitationTeam Conference and Plan of Care Update Date: 01/01/2020   Time: 10:32 AM    Patient Name: Katie Nunez      Medical Record Number: [id number]  Date of Birth: 04/08/1955 Sex: Female         Room/Bed: 4M03C/4M03C-01 Payor Info: Payor: Theme park manager MEDICARE / Plan: [facility] MEDICARE / Product Type: *No Product type* /    Admit Date/Time:  12/18/2019  4:05 PM  Primary Diagnosis:  Intraparenchymal hemorrhage of brain [facility])  Hospital Problems: Principal Problem:   Intraparenchymal hemorrhage of brain [facility]) Active Problems:   Pressure injury of skin    Expected Discharge Date: Expected Discharge Date: 01/11/20  Team Members Present: Physician leading conference: Dr. Alger Simons Care Coodinator Present: Loralee Pacas, LCSWA;Hailyn Zarr Creig Hines, RN, BSN, [place] Nurse Present: Mohammed Kindle, RN PT Present: Tereasa Coop, PT OT Present: Cherylynn Ridges, OT SLP Present: Weston Anna, SLP PPS Coordinator present : Gunnar Fusi, SLP     Current Status/Progress Goal Weekly Team Focus  Bowel/Bladder   pt is continnet of bowel, anuric, she has hemmroids that she is concerned about  remain continet of bowel  administer required hemmroid medication, assess q shoft and prn, toilette as needed   Swallow/Nutrition/ Hydration             ADL's   Mod A transfers, Mod A UB/LB ADLs  min/CGA  R NMR, NMES, self-care retraining, transfers   Mobility   minA bed mobility, minA/modA stand pivot transfers, mod/maxA squat pivot transfers, maxA gait x25' with RW  supervision bed level, minA transfers and ambulation household distances  LLE NMR, transfers, balance, ambulation with RW   Communication             Safety/Cognition/ Behavioral Observations  Supervision-Mod I  Mod I  complex problem solving, recall and attention   Pain   pt c/o 5-7/10 pain in shoulder and "jerking" in legs  decrease pain  assess pain q shift and prn   Skin   skin dry and itchy  remain free of  skin breakdown and infection  assess q shift and prn     Discharge Planning:  D/c to home with her husband who will provide intermittent support as he is a Theme park manager. Their adult son will help PRN as he works.  ?SNF- TBD. Family education 12/22 and 12/27 1pm-3pm with pt husband.   Team Discussion: Continent B/B, has hemorrhoids. Needs to discharge at intermittent supervision. OT reports patient is making slow progress. PT reports patient is min assist for bed mobility, min-mod for stand pivot transfers. Goals are supervision at bed level, min assist transfers and ambulation household distances.SLP reports she is not far from baseline. Trying to use phone because of limited use of RUE. Patient on target to meet rehab goals: yes, but progress is slow, it is doubtful she will discharge at the intermittent level the family is requesting.  *See Care Plan and progress notes for long and short-term goals.   Revisions to Treatment Plan:  MD reports patient had another nose bleed over the weekend, required cauterization, packing is now out.  Teaching Needs: Continue family education Medication management  Current Barriers to Discharge: Decreased caregiver support, Home enviroment access/layout, Behavior and pain and nose bleed mgt.  Possible Resolutions to Barriers: Continue current medications, medication management, pain management, epistaxis management, provide emotional support to patient and family.     Medical Summary Current Status: epistaxis required cauterization, nasal sprays now prn except for saline. BP controlled. HD  ongoing TTS, pain throughout---tramadol helps  Barriers to Discharge: Medical stability  Barriers to Discharge Comments: right hemiparesis Possible Resolutions to [employer] Focus: regular labs with HD, nose mgt, pain control   Continued Need for Acute Rehabilitation Level of Care: The patient requires daily medical management by a physician with specialized training  in physical medicine and rehabilitation for the following reasons: Direction of a multidisciplinary physical rehabilitation program to maximize functional independence : Yes Medical management of patient stability for increased activity during participation in an intensive rehabilitation regime.: Yes Analysis of laboratory values and/or radiology reports with any subsequent need for medication adjustment and/or medical intervention. : Yes   I attest that I was present, lead the team conference, and concur with the assessment and plan of the team.   Cristi Loron 01/01/2020, 6:32 PM

## 2020-01-01 NOTE — Progress Notes (Signed)
Patient ID: Katie Nunez, female   DOB: [birth year], 64 y.o.   MRN: [id number]  SW went by to give pt updates from team conference, but pt gone for dialysis. SW will follow-up with pt.   Loralee Pacas, MSW, [place] Office: [phone] Cell: [phone] Fax: [phone]

## 2020-01-01 NOTE — Progress Notes (Signed)
Physical Therapy Session Note  Patient Details  Name: Katie Nunez MRN: [id number] Date of Birth: 1955/05/10  Today's Date: 01/01/2020 PT Individual Time: 1103-1200 PT Individual Time Calculation (min): 57 min   Short Term Goals: Week 2:  PT Short Term Goal 1 (Week 2): Pt will perform bed mobility with minA. PT Short Term Goal 2 (Week 2): Pt will perform sit to stand with minA. PT Short Term Goal 3 (Week 2): Pt will perform bed to chair transfer with minA. PT Short Term Goal 4 (Week 2): Pt will ambulates 27' with modA.  Skilled Therapeutic Interventions/Progress Updates:     Pt received seated in [facility] and agrees to therapy. No complaint of pain. WC transport to gym for time management. Pt performs stand pivot transfer with maxA and cues on body mechanics and sequencing. Session focused on NMR of R lower extremity. Pt performs multiple reps of sit to stand to RW with modA. In standing, pt requires modA to strap R hand into splint on RW. Pt practices shifting weight laterally with PT blocking R knee. PT allows pt to slightly flex at knee and cues to engage hamstrings to extend knee. Pt progresses to performing L leg lifts with PT blocking R knee. Pt has tendency for pushing to the R, especially with fatigue.  Sit to supine with minA and PT managing R leg. Pt performs therex for NMR of R leg, including 2x15 AAROM: Heel Slides Hip abduction SLRs SAQs PT provides tactile curing for muscular facilitation and correct performance of exercises. Knee flexors and hip adductors appear to be strongest muscle groups. Pt then performs 3x15 supine bridges with PT facilitating glute contraction with sheet under patient and providing manual overpressure through R distal thigh for increased WB. Supine to sit with minA. Squat pivot to WC with modA toward the L. Pt left seated in WC with all needs within reach.  Therapy Documentation Precautions:  Precautions Precautions: Fall Precaution Comments: Rt  hemiplegia - subluxation Restrictions Weight Bearing Restrictions: No    Therapy/Group: Individual Therapy  Breck Coons, PT, DPT 01/01/2020, 12:36 PM

## 2020-01-01 NOTE — Progress Notes (Signed)
SLP Cancellation Note  Patient Details Name: Katie Nunez MRN: [id number] DOB: [birth year]   Cancelled treatment:    Patient missed 60 minutes of skilled SLP intervention due to fatigue and decreased ability to arouse.  Will re-attempt as able. Continue with current plan of care.                                                                                                 Everley Evora 01/01/2020, 12:55 PM

## 2020-01-01 NOTE — Progress Notes (Signed)
Occupational Therapy Session Note  Patient Details  Name: Katie Nunez MRN: [id number] Date of Birth: [birth year]  Today's Date: 01/01/2020 OT Individual Time: 1030-1100 OT Individual Time Calculation (min): 30 min    Short Term Goals: Week 2:  OT Short Term Goal 1 (Week 2): Pt will don pants over feet with mod A. OT Short Term Goal 2 (Week 2): Pt will stand at toilet with mod A of 1. OT Short Term Goal 3 (Week 2): Patient will reposition R UE prior to transfer with min questioning cues OT Short Term Goal 4 (Week 2): Pt will complete self ROM with min cues  Skilled Therapeutic Interventions/Progress Updates:    Pt received sitting up in the w/c with no c/o pain and agreeable to OT session. Pt requested to have hair braided and OT did this for her. Pt was taken to the therapy gym via w/c. Pt completed a stand pivot transfer with the RW to the mat- requiring total A for advancement of the RLE. Mod A overall for transfer. Pt sat EOM and completed activity with focus on RUE weightbearing, with max facilitation to maintain R elbow extension. Graded activity up with LUE reaching across midline to increase load through RLE. Pt then completed same activity in standing with weightbearing through the RW with orthosis. Mod A required and cueing for midline orientation. Pt returned to her w/c, mod A squat pivot. Pt was returned to her room and left sitting up with all needs within reach.   Therapy Documentation Precautions:  Precautions Precautions: Fall Precaution Comments: Rt hemiplegia - subluxation Restrictions Weight Bearing Restrictions: No   Therapy/Group: Individual Therapy  Curtis Sites 01/01/2020, 7:32 AM

## 2020-01-01 NOTE — Progress Notes (Signed)
Occupational Therapy Session Note  Patient Details  Name: Katie Nunez MRN: [id number] Date of Birth: 1955-06-24  Today's Date: 01/01/2020 OT Individual Time: 5093-2671 OT Individual Time Calculation (min): 45 min    Short Term Goals: Week 2:  OT Short Term Goal 1 (Week 2): Pt will don pants over feet with mod A. OT Short Term Goal 2 (Week 2): Pt will stand at toilet with mod A of 1. OT Short Term Goal 3 (Week 2): Patient will reposition R UE prior to transfer with min questioning cues OT Short Term Goal 4 (Week 2): Pt will complete self ROM with min cues  Skilled Therapeutic Interventions/Progress Updates:    Patient greeted semi-reclined in bed, lethargic, but able to wake and agreeable to get dressed and eat breakfast. Pt wanted to perform LB dressing in bed. Pt needed OT assist to thread B LEs into pants, then worked on hip bridging to pull pants up with OT assist to maintain R LE positioning. Pt completed bed mobility with mod A. Blocked practice for UB dressing at EOB with pt needing mod cues to recall hemi techniques from yesterday. Pt needed OT assist to open containers and cut food with one hand. Pt was then able to manage utensils and eat with L hand. Pt completed stand-pivot to wc with mod A and left seated in wc with R UE supported on lap tray.   Therapy Documentation Precautions:  Precautions Precautions: Fall Precaution Comments: Rt hemiplegia - subluxation Restrictions Weight Bearing Restrictions: No Pain: Pain Assessment Pain Scale: (P) 0-10 No pain reported at this time   Therapy/Group: Individual Therapy  Valma Cava 01/01/2020, 8:59 AM

## 2020-01-01 NOTE — Procedures (Signed)
Patient seen and examined on Hemodialysis. BP 117/61 (BP Location: Right Arm)   Pulse 73   Temp 97.8 F (36.6 C) (Oral)   Resp 17   Ht 5\' 5"  (1.651 m)   Wt 54.5 kg   LMP 11/15/2008   SpO2 94%   BMI 19.99 kg/m   QB 400 mL/ min via AVF, UF goal 2L  Tolerating treatment without complaints at this time.   Madelon Lips MD [facility] pgr [phone] 3:44 PM

## 2020-01-01 NOTE — Progress Notes (Signed)
  [facility] Progress Note   Assessment/ Plan:   HD orders: SW TTS  3.5h  450/600   2/2.25 bath 58.5kg  P2  LUA AVF  Hep none -Mircera 50 mcg IV q 4 weeks -Hectorol 6 mcg IV TIW -Sensipar 30 mg PO TIW   Problem/Plan: # ICH-thought secondary to fairly new Coumadin therapy. Coumadinwasheld, reversing agents given,neurologymanaged.Repeat CT of head 12/02 without evidence of extension of hematoma. Now in CIR.  #ESRD:on  TTS schedule.HD yest. Next HD Tuesday.   #Hypertension/Volume- No evidence of volume overload.UF as tol during HD.Monitor blood pressure.  #Anemia of ESRD:Hgb  9.9 now with hgb<10 restart  Low dose Aranesp.  #Secondary hyperparathyroidism: Phosphorus level improved 4.9  Calcium 10.6  corrected   with higher dose of PhosLo. Continue Sensipar and Hectorol.   #pulmonary HTN. Follows with cardiology at [facility]. Currently on tadalafil and Uptravi for her pulmonary hypertension.Marland Kitchen Avoid hypotension.   Subjective:    Seen on HD.  Feeling well.     Objective:   BP 117/61 (BP Location: Right Arm)   Pulse 73   Temp 97.8 F (36.6 C) (Oral)   Resp 17   Ht 5\' 5"  (1.651 m)   Wt 54.5 kg   LMP 11/15/2008   SpO2 94%   BMI 19.99 kg/m   Physical Exam: Gen: NAD, sitting in bed CVS: RRR no m/r/g Resp: clear bilaterally  Abd: soft Ext: no LE edema ACCESS: L AVF  Labs: BMET Recent Labs  Lab 12/26/19 1430 12/29/19 1111 01/01/20 1236  NA 132* 134* 135  K 4.4 3.9 3.8  CL 94* 100 95*  CO2 21*  --  24  GLUCOSE 82 74 87  BUN 85* 41* 54*  CREATININE 9.45* 6.60* 8.44*  CALCIUM 10.0  --  9.8  PHOS 6.0*  --  5.1*   CBC Recent Labs  Lab 12/26/19 1429 12/27/19 0858 12/28/19 1110 12/29/19 1111 01/01/20 1236  WBC 3.0* 3.3*  --   --  3.4*  NEUTROABS  --  2.1  --   --   --   HGB 9.4* 7.0* 9.8* 12.6 9.6*  HCT 28.6* 20.6* 30.1* 37.0 29.7*  MCV 89.7 89.2  --   --  90.5  PLT 174 PLATELET CLUMPS NOTED ON SMEAR, COUNT APPEARS ADEQUATE   --   --  163      Medications:    . ambrisentan  5 mg Oral Daily  . calcium acetate  1,334 mg Oral TID WC  . camphor-menthol   Topical BID  . Chlorhexidine Gluconate Cloth  6 each Topical BID  . cinacalcet  30 mg Oral Q T,Th,Sa-HD  . doxercalciferol  6 mcg Intravenous Q T,Th,Sa-HD  . feeding supplement (NEPRO CARB STEADY)  237 mL Oral BID BM  . Gerhardt's butt cream   Topical QID  . hydrocerin   Topical BID  . hydrocortisone  25 mg Rectal BID  . loratadine  10 mg Oral Daily  . pentoxifylline  400 mg Oral Daily  . psyllium  1 packet Oral BID  . Selexipag  800 mcg Oral BID  . sodium chloride  1 spray Each Nare 5 X Daily  . tadalafil  20 mg Oral Daily     Madelon Lips, MD 01/01/2020, 3:43 PM

## 2020-01-02 ENCOUNTER — Encounter (HOSPITAL_COMMUNITY): Payer: Medicare Other | Admitting: Psychology

## 2020-01-02 ENCOUNTER — Encounter (HOSPITAL_COMMUNITY): Payer: Medicare Other

## 2020-01-02 ENCOUNTER — Encounter (HOSPITAL_COMMUNITY): Payer: Medicare Other | Admitting: Speech Pathology

## 2020-01-02 ENCOUNTER — Ambulatory Visit (HOSPITAL_COMMUNITY): Payer: Medicare Other

## 2020-01-02 ENCOUNTER — Inpatient Hospital Stay (HOSPITAL_COMMUNITY): Payer: Medicare Other

## 2020-01-02 MED ORDER — DARBEPOETIN ALFA 40 MCG/0.4ML IJ SOSY
40.0000 ug | PREFILLED_SYRINGE | INTRAMUSCULAR | Status: DC
  Administered 2020-01-03: 40 ug via INTRAVENOUS
  Filled 2020-01-02 (×3): qty 0.4

## 2020-01-02 MED ORDER — MAGNESIUM GLUCONATE 500 MG PO TABS: 500.0000 mg | ORAL_TABLET | Freq: Every day | ORAL | Status: DC

## 2020-01-02 NOTE — Progress Notes (Signed)
   [facility] Progress Note   Assessment/ Plan:   HD orders: SW TTS  3.5h  450/600   2/2.25 bath 58.5kg  P2  LUA AVF  Hep none -Mircera 50 mcg IV q 4 weeks -Hectorol 6 mcg IV TIW -Sensipar 30 mg PO TIW   Problem/Plan: # ICH-thought secondary to fairly new Coumadin therapy. Coumadinwasheld, reversing agents given,neurologymanaged.Repeat CT of head 12/02 without evidence of extension of hematoma. Now in CIR.  #ESRD:on  TTS schedule.Continue onschedule  #Hypertension/Volume- No evidence of volume overload.UF as tol during HD.Monitor blood pressure.  #Anemia of ESRD:Hgb  9.9 now with hgb<10 restart darbe- 40 q week (Thursday)  #Secondary hyperparathyroidism: Phoslo as binder, continue sensipar and hectorol  #pulmonary HTN. Follows with cardiology at [facility]. Currently on tadalafil and ambrisentan for her pulmonary hypertension.Marland Kitchen Avoid hypotension.   Subjective:    No complaints.  HD with 2 L off.  Did well.       Objective:   BP (!) 102/47 (BP Location: Right Arm)   Pulse 86   Temp 98.7 F (37.1 C) (Oral)   Resp 18   Ht 5\' 5"  (1.651 m)   Wt 52.5 kg   LMP 11/15/2008   SpO2 95%   BMI 19.26 kg/m   Physical Exam: Gen: NAD, sitting in bed CVS: RRR no m/r/g Resp: clear bilaterally  Abd: soft Ext: no LE edema ACCESS: L AVF  Labs: BMET Recent Labs  Lab 12/26/19 1430 12/29/19 1111 01/01/20 1236  NA 132* 134* 135  K 4.4 3.9 3.8  CL 94* 100 95*  CO2 21*  --  24  GLUCOSE 82 74 87  BUN 85* 41* 54*  CREATININE 9.45* 6.60* 8.44*  CALCIUM 10.0  --  9.8  PHOS 6.0*  --  5.1*   CBC Recent Labs  Lab 12/26/19 1429 12/27/19 0858 12/28/19 1110 12/29/19 1111 01/01/20 1236  WBC 3.0* 3.3*  --   --  3.4*  NEUTROABS  --  2.1  --   --   --   HGB 9.4* 7.0* 9.8* 12.6 9.6*  HCT 28.6* 20.6* 30.1* 37.0 29.7*  MCV 89.7 89.2  --   --  90.5  PLT 174 PLATELET CLUMPS NOTED ON SMEAR, COUNT APPEARS ADEQUATE  --   --  163      Medications:    .  ambrisentan  5 mg Oral Daily  . calcium acetate  1,334 mg Oral TID WC  . camphor-menthol   Topical BID  . Chlorhexidine Gluconate Cloth  6 each Topical BID  . cinacalcet  30 mg Oral Q T,Th,Sa-HD  . doxercalciferol  6 mcg Intravenous Q T,Th,Sa-HD  . feeding supplement (NEPRO CARB STEADY)  237 mL Oral BID BM  . Gerhardt's butt cream   Topical QID  . hydrocerin   Topical BID  . hydrocortisone  25 mg Rectal BID  . loratadine  10 mg Oral Daily  . pentoxifylline  400 mg Oral Daily  . psyllium  1 packet Oral BID  . Selexipag  800 mcg Oral BID  . sodium chloride  1 spray Each Nare 5 X Daily  . tadalafil  20 mg Oral Daily     Madelon Lips, MD 01/02/2020, 11:30 AM

## 2020-01-02 NOTE — Progress Notes (Signed)
Subjective: No new c/o. Sitting comfortably in chair. No more bleeding.  Objective: Vital signs in last 24 hours: Temp:  [97.8 F (36.6 C)-98.9 F (37.2 C)] 98.7 F (37.1 C) (12/22 0312) Pulse Rate:  [70-87] 86 (12/22 0312) Resp:  [16-20] 18 (12/22 0312) BP: (102-133)/(47-76) 102/47 (12/22 0312) SpO2:  [94 %-97 %] 95 % (12/22 0312) Weight:  [52.5 kg-54.5 kg] 52.5 kg (12/22 0312)  Physical Exam: General appearance:alert, cooperative and no distress Head:Normocephalic, without obvious abnormality, atraumatic Eyes: Pupils are equal, round, reactive to light. Extraocular motion is intact.  Ears: Examination of the ears shows normal auricles and external auditory canals bilaterally. Nose:Septum is healing well.No bleeding is noted. Face: Facial examination shows no asymmetry. Palpation of the face elicit no significant tenderness.  Mouth: Oral cavity examination shows no mucosal lacerations. No significant trismus is noted.  Neck: Palpation of the neck reveals no lymphadenopathy or mass. The trachea is midline.   Recent Labs    01/01/20 1236  WBC 3.4*  HGB 9.6*  HCT 29.7*  PLT 163   Recent Labs    01/01/20 1236  NA 135  K 3.8  CL 95*  CO2 24  GLUCOSE 87  BUN 54*  CREATININE 8.44*  CALCIUM 9.8    Medications:  I have reviewed the patient's current medications. Scheduled: . ambrisentan  5 mg Oral Daily  . calcium acetate  1,334 mg Oral TID WC  . camphor-menthol   Topical BID  . Chlorhexidine Gluconate Cloth  6 each Topical BID  . cinacalcet  30 mg Oral Q T,Th,Sa-HD  . [START ON 01/03/2020] darbepoetin (ARANESP) injection - DIALYSIS  40 mcg Intravenous Q Thu-HD  . doxercalciferol  6 mcg Intravenous Q T,Th,Sa-HD  . feeding supplement (NEPRO CARB STEADY)  237 mL Oral BID BM  . Gerhardt's butt cream   Topical QID  . hydrocerin   Topical BID  . hydrocortisone  25 mg Rectal BID  . loratadine  10 mg Oral Daily  . pentoxifylline  400 mg Oral Daily  . psyllium  1  packet Oral BID  . Selexipag  800 mcg Oral BID  . sodium chloride  1 spray Each Nare 5 X Daily  . tadalafil  20 mg Oral Daily   Continuous:   Assessment/Plan: Recurrent epistaxis,s/p bilateral nasal cautery in the OR. - No more bleeding. - Nasal septum is healing well. - Nasal ointment and humidifier to prevent nasal dryness. - Pt may follow up in my office after discharge.   LOS: 15 days   Trellis Guirguis W Jeriah Corkum 01/02/2020, 11:36 AM

## 2020-01-02 NOTE — Progress Notes (Signed)
Speech Language Pathology Daily Session Note  Patient Details  Name: Katie Nunez MRN: [id number] Date of Birth: 01-05-1956  Today's Date: 01/02/2020 SLP Individual Time: 7092-9574 SLP Individual Time Calculation (min): 30 min  Short Term Goals: Week 2: SLP Short Term Goal 1 (Week 2): Patient will demonstrate complex problem solving for functional and familiar tasks withMod I. SLP Short Term Goal 2 (Week 2): Patient will recall new, daily information with Supervision verbal cues for use of memory compensatory strategies. SLP Short Term Goal 3 (Week 2): Patient will demonstrate selective attention to tasks in a mildly distracting enviornment for 45 minutes with Supervision verbal cues for redirection.  Skilled Therapeutic Interventions: Skilled treatment session focused on completion of family education with the patient and her husband. SLP facilitated session by providing education regarding patient's current cognitive functioning and strategies to utilize at home to maximize recall and carryover of functional information. Patient is currently unable to utilize her baseline memory strategy of writing information down due to RUE weakness, therefore, educated husband on "memory folder" created on her phone for utilization of apps. Both verbalized understanding. Patient left upright in the wheelchair with alarm on and husband present. Continue with current plan of care.       Pain Pain Assessment Pain Scale: 0-10 Pain Score: 0-No pain  Therapy/Group: Individual Therapy  Refugio Vandevoorde, [place] 01/02/2020, 3:17 PM

## 2020-01-02 NOTE — Progress Notes (Signed)
[place] PHYSICAL MEDICINE & REHABILITATION PROGRESS NOTE   Subjective/Complaints: Complaints of itching and spasms in her leg.  Sleeps poorly but does not like sleeping pills.  Cr 8.44 on yesterday's labs Appreciate Dr. Deeann Saint follow-up  ROS: Patient denies fever, rash, sore throat, blurred vision, nausea, vomiting, diarrhea, cough, shortness of breath or chest pain,  headache, or mood change, + muscle cramps   Objective:   No results found. Recent Labs    01/01/20 1236  WBC 3.4*  HGB 9.6*  HCT 29.7*  PLT 163   Recent Labs    01/01/20 1236  NA 135  K 3.8  CL 95*  CO2 24  GLUCOSE 87  BUN 54*  CREATININE 8.44*  CALCIUM 9.8    Intake/Output Summary (Last 24 hours) at 01/02/2020 1405 Last data filed at 01/01/2020 1730 Gross per 24 hour  Intake --  Output 2000 ml  Net -2000 ml     Pressure Injury 12/18/19 Sacrum Medial Stage 1 -  Intact skin with non-blanchable redness of a localized area usually over a bony prominence. Red, non-blanchable spot over sacrum area (Active)  12/18/19 1707  Location: Sacrum  Location Orientation: Medial  Staging: Stage 1 -  Intact skin with non-blanchable redness of a localized area usually over a bony prominence.  Wound Description (Comments): Red, non-blanchable spot over sacrum area  Present on Admission: Yes  Physical Exam: Gen: no distress, normal appearing HEENT: oral mucosa pink and moist, NCAT Cardio: Reg rate Chest: normal effort, normal rate of breathing Abd: soft, non-distended Ext: no edema Psych: pleasant and cooperative Skin:   LUE fistula intact. Left calf wound dressed. mild sacral redness present Neuro: Pt is cognitively appropriate with normal insight, memory, and awareness. Cranial nerves 2-12 are intact. Seems to sense pain and LT in all 4's. Reflexes are 2+ on left. 3+ right. Fine motor coordination is intact. No tremors. Motor function is grossly 5/5 LUE and LLE.  RUE  0/5 (?tr delt).  RLE 1 Hamstrings and  HAD, otherwise 0/5 distally Musculoskeletal: minimal jt tenderness today   Vital Signs Blood pressure (!) 102/47, pulse 86, temperature 98.7 F (37.1 C), temperature source Oral, resp. rate 18, height 5\' 5"  (1.651 m), weight 52.5 kg, last menstrual period 11/15/2008, SpO2 95 %.  Assessment/Plan: 1. Functional deficits which require 3+ hours per day of interdisciplinary therapy in a comprehensive inpatient rehab setting.  Physiatrist is providing close team supervision and 24 hour management of active medical problems listed below.  Physiatrist and rehab team continue to assess barriers to discharge/monitor patient progress toward functional and medical goals  Care Tool:  Bathing    Body parts bathed by patient: Chest,Abdomen,Right upper leg,Left upper leg,Right arm,Face,Front perineal area,Buttocks,Left arm   Body parts bathed by helper: Right lower leg,Left lower leg     Bathing assist Assist Level: Minimal Assistance - Patient > 75%     Upper Body Dressing/Undressing Upper body dressing   What is the patient wearing?: Bra,Pull over shirt    Upper body assist Assist Level: Moderate Assistance - Patient 50 - 74%    Lower Body Dressing/Undressing Lower body dressing      What is the patient wearing?: Pants     Lower body assist Assist for lower body dressing: Moderate Assistance - Patient 50 - 74%     Toileting Toileting    Toileting assist Assist for toileting: Total Assistance - Patient < 25%     Transfers Chair/bed transfer  Transfers assist  Chair/bed transfer assist level: Maximal Assistance - Patient 25 - 49%     Locomotion Ambulation   Ambulation assist      Assist level: Maximal Assistance - Patient 25 - 49% Assistive device: Walker-rolling Max distance: 25'   Walk 10 feet activity   Assist     Assist level: Maximal Assistance - Patient 25 - 49% Assistive device: Walker-rolling   Walk 50 feet activity   Assist Walk 50 feet  with 2 turns activity did not occur: Safety/medical concerns         Walk 150 feet activity   Assist Walk 150 feet activity did not occur: Safety/medical concerns         Walk 10 feet on uneven surface  activity   Assist Walk 10 feet on uneven surfaces activity did not occur: Safety/medical concerns         Wheelchair     Assist Will patient use wheelchair at discharge?: Yes Type of Wheelchair: Manual Wheelchair activity did not occur: Safety/medical concerns         Wheelchair 50 feet with 2 turns activity    Assist    Wheelchair 50 feet with 2 turns activity did not occur: Safety/medical concerns       Wheelchair 150 feet activity     Assist  Wheelchair 150 feet activity did not occur: Safety/medical concerns       Blood pressure (!) 102/47, pulse 86, temperature 98.7 F (37.1 C), temperature source Oral, resp. rate 18, height 5\' 5"  (1.651 m), weight 52.5 kg, last menstrual period 11/15/2008, SpO2 95 %.  Medical Problem List and Plan: 1.  L frontal ICH secondary to recent starting of Coumadin- Coumadin on hold with R hemiplegia             -patient may shower             -ELOS/Goals: 12/31  -Continue CIR therapies including PT, OT, and SLP.    2.  Portal vein thrombosis/A fib/Antithrombotics: -DVT/anticoagulation:  Pharmaceutical: SCDs due to thrombocytopenia and recent bleed.  --per neuro no coumadin-->will need to wait at least one month due to size of bleed.  --dopplers ok             -antiplatelet therapy: N/A 3. Pain Management: tylenol prn.   - kpad for neck pain  -will replace robaxin with flexeril prn  -may have tramadol q8 prn  -12/17 right shoulder/neck pain still an issue off an on. Would like 50mg  tramadol   -will increase tramadol to 50mg  q8 prn  12/20- pain controlled- con't regimen   -gabapentin 100mg  bid prn initiated this weekend with results  12/22: complains of muscle cramps- can use kpad.  4. Mood: LCSW to follow  up for evaluation and support.              -antipsychotic agents: N/A  -add trazodone qhs prn for sleep 5. Neuropsych: This patient is capable of making decisions on her own behalf. 6. Wound ulcer/Skin/Wound Care:Pack daily wound care LLE.    -turning, nutrition, education  12/14 -added scheduled benadryl for itching. Also receiving sarna        -prn vistaril 7. Fluids/Electrolytes/Nutrition: Strict I/O.  Labs with HD TTS reviewed, continue to monitor.  8. ESRD: Continue HD on TTS at the end of the day to help with therapy tolerance.  9. HTN: Monitor BP tid-currently controlled on current regimen 10. Pulmonary HTN: Oxygen dependent. Continue Ambrisentan and Selexipag. On Home dose 2L by [place] 11.  Cirrhosis of the liver: Monitor platelets and for any signs of bleeding. Reporting abdominal distension.  (protal vein thrombus)  -f/u abd CT as outpt 12. Chronic systolic CHF: Monitor daily weights as well as I/O. Continue tadalafil.    Filed Weights   01/01/20 1347 01/01/20 1748 01/02/20 0312  Weight: 54.5 kg 52.5 kg 52.5 kg    -volume mgt per nephro 13. CAF: Monitor HR tid--metoprolol has been on hold. Coumadin to be held due to [place].  14. Pancytopenia:  Continue to monitor with serial checks.   12/20--inconsistent H&H levels--up to 12 on 12/18!  15. Constipation WITH hemorrhoids- seems to cycle between loose stool  -prn miralax only  12/13 pt with mushy,loose stools yet seems to report constipation   -backed of scheduled senna-s for now   -added daily fiber packet  -encouraging regular solid/liquid intake as well  -stopped amitiza 12/14  -resumed senna-s at bedtime 12/15 16. Significant epistaxis (pt has prior history)  12/16 -recurrent bleeding in right nose---stopped with rocket   -ENT Benjamine Mola) is planning on potential cauterization this weekend  12/18-Dr. Benjamine Mola cauterized hypervascular areas in nasal septi  12/20-packing out today  12/22: appreciate follow-up, healing well, f/u with Dr.  Benjamine Mola outpatient. 17. Insomnia:   12/20: continue 50mg  trazodone qhs prn 18. Right neck nodule appears resolved. Likely node    LOS: 15 days A FACE TO FACE EVALUATION WAS PERFORMED  Katie Nunez 01/02/2020, 2:05 PM

## 2020-01-02 NOTE — Progress Notes (Signed)
Physical Therapy Session Note  Patient Details  Name: Katie Nunez MRN: [id number] Date of Birth: [birth year]  Today's Date: 01/02/2020 PT Individual Time: 1030-1103 PT Individual Time Calculation (min): 33 min   Short Term Goals: Week 2:  PT Short Term Goal 1 (Week 2): Pt will perform bed mobility with minA. PT Short Term Goal 2 (Week 2): Pt will perform sit to stand with minA. PT Short Term Goal 3 (Week 2): Pt will perform bed to chair transfer with minA. PT Short Term Goal 4 (Week 2): Pt will ambulates 17' with modA.     Skilled Therapeutic Interventions/Progress Updates:  Pt seated in wc; she denied pain.   neuromuscular re-education via forced use, multimodal cues for RLE activation: - use of Kinetron at 50cm/sec for bilateral alternating reciprocal movement x 25 cycles; using RLE unilaterally with assistance; bil hands on knee for increased sensory input.  Minimal activation noted; pt verbalized some fatigue in quads. - reciprocal scooting in w/c to front of seat, with mod assist for R pelvis -sit> stand with mod assist; standing with L pelvis against hight sturdy table for R flexion/extension.  Extension appears to occur via hamstrings.  At end of session, pt seated in w/c with needs at hand and seat belt pad alarm activated. PT placed her hand in pt's R hand; she squeezed it.  Pt became tearful, stating that this was the 1st time she was able to squeeze with her r hand.     Therapy Documentation Precautions:  Precautions Precautions: Fall Precaution Comments: Rt hemiplegia - subluxation Restrictions Weight Bearing Restrictions: No          Therapy/Group: Individual Therapy  Whittley Carandang 01/02/2020, 12:21 PM

## 2020-01-02 NOTE — Progress Notes (Signed)
Occupational Therapy Session Note  Patient Details  Name: Katie Nunez MRN: [id number] Date of Birth: Jun 24, 1955  Today's Date: 01/02/2020 OT Individual Time: 0905-1000 OT Individual Time Calculation (min): 55 min    Short Term Goals: Week 1:  OT Short Term Goal 1 (Week 1): Pt will complete a stand pivot to toilet with mod A of 1. OT Short Term Goal 1 - Progress (Week 1): Met OT Short Term Goal 2 (Week 1): Pt will stand at toilet with mod A of 1. OT Short Term Goal 2 - Progress (Week 1): Progressing toward goal OT Short Term Goal 3 (Week 1): Pt will don shirt with mod A. OT Short Term Goal 3 - Progress (Week 1): Met OT Short Term Goal 4 (Week 1): Pt will don pants over feet with mod A. OT Short Term Goal 4 - Progress (Week 1): Progressing toward goal  Skilled Therapeutic Interventions/Progress Updates:    1:1. Pt received in w/c agreeable to OT requsting to shower. Pt completes stand pivot transfer into shower with MOD A overall. Pt completes bathing sit to stand with min-MOD A overall for standing balance while pt washes buttocks and peri area. Adaptive techniques for washing LUE/armpit provided with pt able to return demo. UB dressing with MOD A to don bra/shirt and MOD A to don pants for advancing past hips in standing (MIN A for balance). OT braids hair after pt combs. Exited session with pt seated in bed, exit alarm on and call light in reach   Therapy Documentation Precautions:  Precautions Precautions: Fall Precaution Comments: Rt hemiplegia - subluxation Restrictions Weight Bearing Restrictions: No General:   Vital Signs: Therapy Vitals Temp: 98.7 F (37.1 C) Temp Source: Oral Pulse Rate: 86 Resp: 18 BP: (!) 102/47 Patient Position (if appropriate): Lying Oxygen Therapy SpO2: 95 % O2 Device: Nasal Cannula Pain:   ADL: ADL Eating: Set up Grooming: Minimal assistance Upper Body Bathing: Moderate assistance Where Assessed-Upper Body Bathing: Chair Lower  Body Bathing: Maximal assistance Where Assessed-Lower Body Bathing: Chair Upper Body Dressing: Maximal assistance Where Assessed-Upper Body Dressing: Chair Lower Body Dressing: Dependent Where Assessed-Lower Body Dressing: Chair Toileting: Dependent Where Assessed-Toileting: Glass blower/designer: Moderate assistance Toilet Transfer Method: Other (comment) (stedy) Vision   Perception    Praxis   Exercises:   Other Treatments:     Therapy/Group: Individual Therapy  Tonny Branch 01/02/2020, 6:57 AM

## 2020-01-02 NOTE — Progress Notes (Signed)
Occupational Therapy Session Note  Patient Details  Name: Katie Nunez MRN: [id number] Date of Birth: 1955/06/18  Today's Date: 01/02/2020 OT Individual Time: 1300-1345 OT Individual Time Calculation (min): 45 min    Short Term Goals: Week 1:  OT Short Term Goal 1 (Week 1): Pt will complete a stand pivot to toilet with mod A of 1. OT Short Term Goal 1 - Progress (Week 1): Met OT Short Term Goal 2 (Week 1): Pt will stand at toilet with mod A of 1. OT Short Term Goal 2 - Progress (Week 1): Progressing toward goal OT Short Term Goal 3 (Week 1): Pt will don shirt with mod A. OT Short Term Goal 3 - Progress (Week 1): Met OT Short Term Goal 4 (Week 1): Pt will don pants over feet with mod A. OT Short Term Goal 4 - Progress (Week 1): Progressing toward goal  Skilled Therapeutic Interventions/Progress Updates:    1:1. Pt and husband present for family ed session. Pt with no pain and OT demo/edu re toilet transfers demo, demo sit to stand, weight shifting, knee blocking, body mechanics, w/c parts management, hemi positioning, talking through hemi dressing, prognosis of CVA motor recovery, halk lap tray pricing and saebo estim. PT husband able to return demo squat pivot transfers as well as sit to stand for clothing amangmnent with OT cuing for L weight shift. saebo stim one applied to deltoid for 1 hour with good aproximation of shoulder. Skin in tact at end of session,  Saebo Stim One 330 pulse width 35 Hz pulse rate On 8 sec/ off 8 sec Ramp up/ down 2 sec Symmetrical Biphasic wave form  Max intensity 110m at 500 Ohm load  Exited session with pt seated in bed, exit alarm on and call light in reach    Therapy Documentation Precautions:  Precautions Precautions: Fall Precaution Comments: Rt hemiplegia - subluxation Restrictions Weight Bearing Restrictions: No General:   Vital Signs:  Pain: Pain Assessment Pain Scale: 0-10 Pain Score: 0-No pain ADL: ADL Eating: Set  up Grooming: Minimal assistance Upper Body Bathing: Moderate assistance Where Assessed-Upper Body Bathing: Chair Lower Body Bathing: Maximal assistance Where Assessed-Lower Body Bathing: Chair Upper Body Dressing: Maximal assistance Where Assessed-Upper Body Dressing: Chair Lower Body Dressing: Dependent Where Assessed-Lower Body Dressing: Chair Toileting: Dependent Where Assessed-Toileting: TGlass blower/designer Moderate assistance Toilet Transfer Method: Other (comment) (stedy) Vision   Perception    Praxis   Exercises:   Other Treatments:     Therapy/Group: Individual Therapy  STonny Branch12/22/2021, 1:46 PM

## 2020-01-02 NOTE — Progress Notes (Signed)
Patient ID: Katie Nunez, female   DOB: 03/03/1955, 64 y.o.   MRN: [id number]  SW met with pt in room to follow-up after family edu today, and to give updates from team conference. Pt husband already left by time SW arrived.  Pt stated family edu went well. Pt is aware no changes in d/c date. SW informed will continue to follow-up. Family edu scheduled for 12/27 1pm-3pm.   Loralee Pacas, MSW, [place] Office: [phone] Cell: [phone] Fax: [phone]

## 2020-01-02 NOTE — Progress Notes (Signed)
Physical Therapy Session Note  Patient Details  Name: Katie Nunez MRN: [id number] Date of Birth: January 23, 1955  Today's Date: 01/02/2020 PT Individual Time: 1346-1430 PT Individual Time Calculation (min): 44 min   Short Term Goals: Week 2:  PT Short Term Goal 1 (Week 2): Pt will perform bed mobility with minA. PT Short Term Goal 2 (Week 2): Pt will perform sit to stand with minA. PT Short Term Goal 3 (Week 2): Pt will perform bed to chair transfer with minA. PT Short Term Goal 4 (Week 2): Pt will ambulates 67' with modA.  Skilled Therapeutic Interventions/Progress Updates:     Pt received seated in [facility] and agrees to therapy. No complaint of pain. Pt's husband present for family education. WC transport to gym for time management. PT demonstrates several car transfers with pt, utilizing stand pivot technique, and with modA. Pt's husband then provides assistance for transfer while PT provides verbal cueing on technique and sequencing. Pt's husband demonstrates good technique and safety with minimal cueing on body mechanics and ensuring blocking of pt's R knee to prevent buckling.   Squat pivot transfer to mat table with modA. Sit to supine with modA. In supine, pt's R leg positioned hanging from table to bring hip into slight extension, with foot propped on stool. L leg extended on table to minimize compensation and activation. Pt performs single leg bridges with R leg, with PT providing manual facilitation of WB through R distal thigh with tactile facilitation of muscular activation of R glute. Pt performs 3x10 with 10 second hold on final rep. Supine to sit and squat pivot back to WC with modA. Pt left seated in [facility] with all needs within reach and speech therapist present.  Therapy Documentation Precautions:  Precautions Precautions: Fall Precaution Comments: Rt hemiplegia - subluxation Restrictions Weight Bearing Restrictions: No   Therapy/Group: Individual Therapy  Breck Coons,  PT, DPT 01/02/2020, 3:05 PM

## 2020-01-02 NOTE — Progress Notes (Signed)
Speech Language Pathology Discharge Summary  Patient Details  Name: Katie Nunez MRN: [id number] Date of Birth: 1955/06/13  Today's Date: 01/03/2020 SLP Individual Time: 1030-1100 SLP Individual Time Calculation (min): 30 min   Skilled Therapeutic Interventions:   Pt was seen for skilled ST targeting cognitive goals.  Pt was received sitting upright in wheelchair having just returned from PT session.  Pt reported feeling fatigued but pleased that she was able to do things in therapy that she hasn't done before.  Pt was aggreeable to participating in treatment.  SLP administered the [facility] Mental Status Exam given reports that pt is near her cognitive baseline.  Pt scored 24/30 which remains in the range of mild impairment; however, it is 2 points above her initial score from when she was first admitted.  Pt also endorsed that her performance on assessment today is reflective of her typical performance on similar tasks.  As a result, I recommend that pt be discharged from [facility] at this time as pt and family both endorse pt to be near her cognitive baseline.    Patient has met 3 of 3 long term goals.  Patient to discharge at overall Modified Independent level.   Reasons goals not met: N/A   Clinical Impression/Discharge Summary: Patient has made excellent gains and has met 3 of 3 LTGs this admission. Currently, patient is overall Mod I to complete functional and mildly complex tasks safely in regards to problem solving memory and recall. Patient and family education is complete and patient will discharge home with assistance from family. Both the patient and her husband report patient is at her cognitive baseline, therefore, she will be discharged from skilled SLP intervention with f/u not warranted at this time.   Care Partner:  Caregiver Able to Provide Assistance: Yes  Type of Caregiver Assistance: Physical  Recommendation:  None      Equipment: N/A   Reasons  for discharge: Treatment goals met   Patient/Family Agrees with Progress Made and Goals Achieved: Yes    PAYNE, Aragon 01/02/2020, 3:21 PM

## 2020-01-03 ENCOUNTER — Inpatient Hospital Stay (HOSPITAL_COMMUNITY): Payer: Medicare Other | Admitting: Speech Pathology

## 2020-01-03 ENCOUNTER — Inpatient Hospital Stay (HOSPITAL_COMMUNITY): Payer: Medicare Other | Admitting: Occupational Therapy

## 2020-01-03 ENCOUNTER — Inpatient Hospital Stay (HOSPITAL_COMMUNITY): Payer: Medicare Other

## 2020-01-03 ENCOUNTER — Ambulatory Visit: Payer: Medicare Other | Admitting: Sports Medicine

## 2020-01-03 LAB — CBC
HCT: 32.2 % — ABNORMAL LOW (ref 36.0–46.0)
Hemoglobin: 10.2 g/dL — ABNORMAL LOW (ref 12.0–15.0)
MCH: 28.8 pg (ref 26.0–34.0)
MCHC: 31.7 g/dL (ref 30.0–36.0)
MCV: 91 fL (ref 80.0–100.0)
Platelets: 148 10*3/uL — ABNORMAL LOW (ref 150–400)
RBC: 3.54 MIL/uL — ABNORMAL LOW (ref 3.87–5.11)
RDW: 16 % — ABNORMAL HIGH (ref 11.5–15.5)
WBC: 4.8 10*3/uL (ref 4.0–10.5)
nRBC: 0 % (ref 0.0–0.2)

## 2020-01-03 LAB — RENAL FUNCTION PANEL
Albumin: 3.1 g/dL — ABNORMAL LOW (ref 3.5–5.0)
Anion gap: 15 (ref 5–15)
BUN: 51 mg/dL — ABNORMAL HIGH (ref 8–23)
CO2: 24 mmol/L (ref 22–32)
Calcium: 10.4 mg/dL — ABNORMAL HIGH (ref 8.9–10.3)
Chloride: 95 mmol/L — ABNORMAL LOW (ref 98–111)
Creatinine, Ser: 7.32 mg/dL — ABNORMAL HIGH (ref 0.44–1.00)
GFR, Estimated: 6 mL/min — ABNORMAL LOW (ref >60)
Glucose, Bld: 101 mg/dL — ABNORMAL HIGH (ref 70–99)
Phosphorus: 5.2 mg/dL — ABNORMAL HIGH (ref 2.5–4.6)
Potassium: 4.4 mmol/L (ref 3.5–5.1)
Sodium: 134 mmol/L — ABNORMAL LOW (ref 135–145)

## 2020-01-03 MED ORDER — DOXERCALCIFEROL 4 MCG/2ML IV SOLN
INTRAVENOUS | Status: AC
  Filled 2020-01-03: qty 4

## 2020-01-03 MED ORDER — CINACALCET HCL 30 MG PO TABS
ORAL_TABLET | ORAL | Status: AC
  Filled 2020-01-03: qty 1

## 2020-01-03 MED ORDER — TRIAMCINOLONE 0.1 % CREAM:EUCERIN CREAM 1:1
TOPICAL_CREAM | Freq: Two times a day (BID) | CUTANEOUS | Status: DC
  Filled 2020-01-03 (×2): qty 1

## 2020-01-03 MED ORDER — DARBEPOETIN ALFA 40 MCG/0.4ML IJ SOSY
PREFILLED_SYRINGE | INTRAMUSCULAR | Status: AC
  Filled 2020-01-03: qty 0.4

## 2020-01-03 NOTE — Progress Notes (Signed)
Occupational Therapy Session Note  Patient Details  Name: Katie Nunez MRN: [id number] Date of Birth: September 27, 1955  Today's Date: 01/03/2020 OT Individual Time: 2589-4834 OT Individual Time Calculation (min): 72 min   Short Term Goals: Week 2:  OT Short Term Goal 1 (Week 2): Pt will don pants over feet with mod A. OT Short Term Goal 2 (Week 2): Pt will stand at toilet with mod A of 1. OT Short Term Goal 3 (Week 2): Patient will reposition R UE prior to transfer with min questioning cues OT Short Term Goal 4 (Week 2): Pt will complete self ROM with min cues  Skilled Therapeutic Interventions/Progress Updates:    Patient greeted semi-reclined in bed and agreeable to OT treatment session focused on self-care retraining. Pt completed bed mobility with min A today as she was able to advance R LE towards EOB. Squat-pivot from bed>wc with min A to the L. Stand-pivot into shower on weaker R side with mod A. Bathing completed using hip hike and leaning to wash bottom. Hand over hand A to integrate R UE into bathing tasks for neuro re-ed with increased flexor synergy and activation from scap and shoulder. Pt was also able to grip my hand today! Dressing completed from wc wt the sink with improved recall of hemi techniques, but still requiring min/mod A for dressing tasks. Sit<> stands at the sink with min A. Pt finished up grooming tasks from wc at the sink including hair drying task. Pt left seated in wc with lap tray on, alarm belt on and needs met. OT placed SAEBO e-stim on R shoulder for subluxation. SAEBO left on for 60 minutes. OT returned to remove SAEBO with skin intact and no adverse reactions.  Saebo Stim One 330 pulse width 35 Hz pulse rate On 8 sec/ off 8 sec Ramp up/ down 2 sec Symmetrical Biphasic wave form  Max intensity 179m at 500 Ohm load   Therapy Documentation Precautions:  Precautions Precautions: Fall Precaution Comments: Rt hemiplegia - subluxation Restrictions Weight  Bearing Restrictions: No Pain:  denies pain  Therapy/Group: Individual Therapy  EValma Cava12/23/2021, 8:32 AM

## 2020-01-03 NOTE — Progress Notes (Signed)
Physical Therapy Session Note  Patient Details  Name: Katie Nunez MRN: [id number] Date of Birth: 1955-08-16  Today's Date: 01/03/2020 PT Individual Time: 2836-6294 PT Individual Time Calculation (min): 43 min   Short Term Goals: Week 2:  PT Short Term Goal 1 (Week 2): Pt will perform bed mobility with minA. PT Short Term Goal 2 (Week 2): Pt will perform sit to stand with minA. PT Short Term Goal 3 (Week 2): Pt will perform bed to chair transfer with minA. PT Short Term Goal 4 (Week 2): Pt will ambulates 30' with modA.  Skilled Therapeutic Interventions/Progress Updates:     Pt received seated in WC and agreeable to therapy. No complaint of pain. WC transport to gym for time management. Pt able to lift R leg out of WC foot rest without assistance and demonstrates ~3/5 strength in R quad! Focus of session NMR for R leg with standing balance, activity tolerance, and strengthening. Pt performs multiple reps of sit to stand from [facility] with modA, with L leg propped on 5 inch step to minimize activation. In standing, PT blocks R knee while pt performs peg board activity, placing colored pegs in specific arrangement. Pt requires several seated rest breaks during activity. Pt then performs similar activity, but cued to allow R knee to flex slowly and then bring back to full extension. PT provides verbal and tactile cues to facilitate. Pt ambulates with RW and R hand splint, x25', with modA. Pt able to occasionally progress R leg without manual assistance, but PT typically manually progressing R leg and blocking knee during stance phase.  Pt left seated in WC with R lap tray and all needs within reach.  Therapy Documentation Precautions:  Precautions Precautions: Fall Precaution Comments: Rt hemiplegia - subluxation Restrictions Weight Bearing Restrictions: No    Therapy/Group: Individual Therapy  Breck Coons, PT, DPT 01/03/2020, 4:30 PM

## 2020-01-03 NOTE — Progress Notes (Signed)
  [facility] Progress Note   Assessment/ Plan:   HD orders: SW TTS  3.5h  450/600   2/2.25 bath 58.5kg  P2  LUA AVF  Hep none -Mircera 50 mcg IV q 4 weeks -Hectorol 6 mcg IV TIW -Sensipar 30 mg PO TIW   Problem/Plan: # ICH-thought secondary to fairly new Coumadin therapy. Coumadinwasheld, reversing agents given,neurologymanaged.Repeat CT of head 12/02 without evidence of extension of hematoma. Now in CIR.  #ESRD:on  TTS schedule.Continue on schedule  #Hypertension/Volume- No evidence of volume overload.UF as tol during HD.Monitor blood pressure.  #Anemia of ESRD:Hgb  9.9 now with hgb<10 restart darbe- 40 q week (Thursday)  #Secondary hyperparathyroidism: Phoslo as binder, continue sensipar and hectorol  #pulmonary HTN. Follows with cardiology at [facility]. Currently on tadalafil and ambrisentan for her pulmonary hypertension.Marland Kitchen Avoid hypotension.   # Epistaxis: s/p cauterization with Dr Benjamine Mola 12/18, no further bleeding episodes  Subjective:    Getting transferred from wheelchair to bed in preparation to go up to dialysis today.   Objective:   BP 121/65 (BP Location: Left Arm)   Pulse 77   Temp 98.2 F (36.8 C) (Oral)   Resp 16   Ht 5\' 5"  (1.651 m)   Wt 51.1 kg   LMP 11/15/2008   SpO2 100%   BMI 18.75 kg/m   Physical Exam: Gen: NAD, sitting in bed CVS: RRR no m/r/g Resp: clear bilaterally  Abd: soft Ext: no LE edema ACCESS: L AVF  Labs: BMET Recent Labs  Lab 12/29/19 1111 01/01/20 1236  NA 134* 135  K 3.9 3.8  CL 100 95*  CO2  --  24  GLUCOSE 74 87  BUN 41* 54*  CREATININE 6.60* 8.44*  CALCIUM  --  9.8  PHOS  --  5.1*   CBC Recent Labs  Lab 12/28/19 1110 12/29/19 1111 01/01/20 1236  WBC  --   --  3.4*  HGB 9.8* 12.6 9.6*  HCT 30.1* 37.0 29.7*  MCV  --   --  90.5  PLT  --   --  163      Medications:    . ambrisentan  5 mg Oral Daily  . calcium acetate  1,334 mg Oral TID WC  . camphor-menthol   Topical BID   . Chlorhexidine Gluconate Cloth  6 each Topical BID  . cinacalcet  30 mg Oral Q T,Th,Sa-HD  . darbepoetin (ARANESP) injection - DIALYSIS  40 mcg Intravenous Q Thu-HD  . doxercalciferol  6 mcg Intravenous Q T,Th,Sa-HD  . feeding supplement (NEPRO CARB STEADY)  237 mL Oral BID BM  . Gerhardt's butt cream   Topical QID  . hydrocerin   Topical BID  . hydrocortisone  25 mg Rectal BID  . loratadine  10 mg Oral Daily  . pentoxifylline  400 mg Oral Daily  . psyllium  1 packet Oral BID  . Selexipag  800 mcg Oral BID  . sodium chloride  1 spray Each Nare 5 X Daily  . tadalafil  20 mg Oral Daily  . triamcinolone 0.1 % cream : eucerin   Topical BID     Madelon Lips, MD 01/03/2020, 2:14 PM

## 2020-01-03 NOTE — Progress Notes (Signed)
[facility] PHYSICAL MEDICINE & REHABILITATION PROGRESS NOTE   Subjective/Complaints:  Pt reports still having itching/dry skin- would like to try something else.  O2 dried her out, and makes her nose bleed more, so only wears when has to use it.  Bleeding- only trace since took nare plugging out Monday.   Hemorrhoids like stool "hanging out"  Per pt- feels like stool coming out all the time.   ROS:  Pt denies SOB, abd pain, CP, N/V/C/D, and vision changes   Objective:   No results found. Recent Labs    01/01/20 1236  WBC 3.4*  HGB 9.6*  HCT 29.7*  PLT 163   Recent Labs    01/01/20 1236  NA 135  K 3.8  CL 95*  CO2 24  GLUCOSE 87  BUN 54*  CREATININE 8.44*  CALCIUM 9.8   No intake or output data in the 24 hours ending 01/03/20 0920   Pressure Injury 12/18/19 Sacrum Medial Stage 1 -  Intact skin with non-blanchable redness of a localized area usually over a bony prominence. Red, non-blanchable spot over sacrum area (Active)  12/18/19 1707  Location: Sacrum  Location Orientation: Medial  Staging: Stage 1 -  Intact skin with non-blanchable redness of a localized area usually over a bony prominence.  Wound Description (Comments): Red, non-blanchable spot over sacrum area  Present on Admission: Yes  Physical Exam: Gen: sitting up in bed- O2 off, no nasal bleeding, NAD HEENT: oral mucosa pink and moist, NCAT Cardio: tachycardic- regular rhythm Chest: CTA B/L- no W/R/R- good air movement Abd: Soft, NT, ND, (+)BS  Ext: no edema Psych: pleasant and cooperative Skin:   LUE fistula intact. Left calf wound dressed. mild sacral redness present- also has a red area on R lower leg- no increased heat, no cellulitis seen Neuro: Pt is cognitively appropriate with normal insight, memory, and awareness. Cranial nerves 2-12 are intact. Seems to sense pain and LT in all 4's. Reflexes are 2+ on left. 3+ right. Fine motor coordination is intact. No tremors. Motor function is grossly  5/5 LUE and LLE.  RUE  0/5 (?tr delt).  RLE 1 Hamstrings and HAD, otherwise 0/5 distally Musculoskeletal: minimal jt tenderness today   Vital Signs Blood pressure 121/65, pulse 77, temperature 98.2 F (36.8 C), temperature source Oral, resp. rate 16, height 5\' 5"  (1.651 m), weight 51.1 kg, last menstrual period 11/15/2008, SpO2 100 %.  Assessment/Plan: 1. Functional deficits which require 3+ hours per day of interdisciplinary therapy in a comprehensive inpatient rehab setting.  Physiatrist is providing close team supervision and 24 hour management of active medical problems listed below.  Physiatrist and rehab team continue to assess barriers to discharge/monitor patient progress toward functional and medical goals  Care Tool:  Bathing    Body parts bathed by patient: Chest,Abdomen,Right upper leg,Left upper leg,Right arm,Face,Front perineal area,Buttocks,Left arm   Body parts bathed by helper: Right lower leg,Left lower leg     Bathing assist Assist Level: Minimal Assistance - Patient > 75%     Upper Body Dressing/Undressing Upper body dressing   What is the patient wearing?: Bra,Pull over shirt    Upper body assist Assist Level: Moderate Assistance - Patient 50 - 74%    Lower Body Dressing/Undressing Lower body dressing      What is the patient wearing?: Pants     Lower body assist Assist for lower body dressing: Moderate Assistance - Patient 50 - 74%     Toileting Toileting  Toileting assist Assist for toileting: Total Assistance - Patient < 25%     Transfers Chair/bed transfer  Transfers assist     Chair/bed transfer assist level: Moderate Assistance - Patient 50 - 74%     Locomotion Ambulation   Ambulation assist      Assist level: Maximal Assistance - Patient 25 - 49% Assistive device: Walker-rolling Max distance: 25'   Walk 10 feet activity   Assist     Assist level: Maximal Assistance - Patient 25 - 49% Assistive device:  Walker-rolling   Walk 50 feet activity   Assist Walk 50 feet with 2 turns activity did not occur: Safety/medical concerns         Walk 150 feet activity   Assist Walk 150 feet activity did not occur: Safety/medical concerns         Walk 10 feet on uneven surface  activity   Assist Walk 10 feet on uneven surfaces activity did not occur: Safety/medical concerns         Wheelchair     Assist Will patient use wheelchair at discharge?: Yes Type of Wheelchair: Manual Wheelchair activity did not occur: Safety/medical concerns         Wheelchair 50 feet with 2 turns activity    Assist    Wheelchair 50 feet with 2 turns activity did not occur: Safety/medical concerns       Wheelchair 150 feet activity     Assist  Wheelchair 150 feet activity did not occur: Safety/medical concerns       Blood pressure 121/65, pulse 77, temperature 98.2 F (36.8 C), temperature source Oral, resp. rate 16, height 5\' 5"  (1.651 m), weight 51.1 kg, last menstrual period 11/15/2008, SpO2 100 %.  Medical Problem List and Plan: 1.  L frontal ICH secondary to recent starting of Coumadin- Coumadin on hold with R hemiplegia             -patient may shower             -ELOS/Goals: 12/31  -Continue CIR therapies including PT, OT, and SLP.    2.  Portal vein thrombosis/A fib/Antithrombotics: -DVT/anticoagulation:  Pharmaceutical: SCDs due to thrombocytopenia and recent bleed.  --per neuro no coumadin-->will need to wait at least one month due to size of bleed.  --dopplers ok             -antiplatelet therapy: N/A 3. Pain Management: tylenol prn.   - kpad for neck pain  -will replace robaxin with flexeril prn  -may have tramadol q8 prn  -12/17 right shoulder/neck pain still an issue off an on. Would like 50mg  tramadol   -will increase tramadol to 50mg  q8 prn  12/20- pain controlled- con't regimen   -gabapentin 100mg  bid prn initiated this weekend with results  12/22:  complains of muscle cramps- can use kpad.   12/23- Pain controlled- con't meds 4. Mood: LCSW to follow up for evaluation and support.              -antipsychotic agents: N/A  -add trazodone qhs prn for sleep 5. Neuropsych: This patient is capable of making decisions on her own behalf. 6. Wound ulcer/Skin/Wound Care:Pack daily wound care LLE.    -turning, nutrition, education  12/14 -added scheduled benadryl for itching. Also receiving sarna        -prn vistaril  12/23- added eucerin with cortisone in it- will apply BID- see if that helps.  7. Fluids/Electrolytes/Nutrition: Strict I/O.  Labs with HD TTS reviewed, continue  to monitor.  8. ESRD: Continue HD on TTS at the end of the day to help with therapy tolerance.  9. HTN: Monitor BP tid-currently controlled on current regimen 10. Pulmonary HTN: Oxygen dependent. Continue Ambrisentan and Selexipag. On Home dose 2L by [place] 11. Cirrhosis of the liver: Monitor platelets and for any signs of bleeding. Reporting abdominal distension.  (protal vein thrombus)  -f/u abd CT as outpt 12. Chronic systolic CHF: Monitor daily weights as well as I/O. Continue tadalafil.    Filed Weights   01/01/20 1748 01/02/20 0312 01/03/20 0500  Weight: 52.5 kg 52.5 kg 51.1 kg    -volume mgt per nephro  12/23- down 1 kg- per pt, is at college weight right now 13. CAF: Monitor HR tid--metoprolol has been on hold. Coumadin to be held due to [place].  14. Pancytopenia:  Continue to monitor with serial checks.   12/20--inconsistent H&H levels--up to 12 on 12/18!  15. Constipation WITH hemorrhoids- seems to cycle between loose stool  -prn miralax only  12/13 pt with mushy,loose stools yet seems to report constipation   -backed of scheduled senna-s for now   -added daily fiber packet  -encouraging regular solid/liquid intake as well  -stopped amitiza 12/14  -resumed senna-s at bedtime 12/15 16. Significant epistaxis (pt has prior history)  12/16 -recurrent bleeding in  right nose---stopped with rocket   -ENT Benjamine Mola) is planning on potential cauterization this weekend  12/18-Dr. Benjamine Mola cauterized hypervascular areas in nasal septi  12/20-packing out today  12/22: appreciate follow-up, healing well, f/u with Dr. Benjamine Mola outpatient.  12/23- bleeding resolved except for trace "crusties".  17. Insomnia:   12/20: continue 50mg  trazodone qhs prn 18. Right neck nodule appears resolved. Likely node    LOS: 16 days A FACE TO FACE EVALUATION WAS PERFORMED  Trew Sunde 01/03/2020, 9:20 AM

## 2020-01-03 NOTE — Progress Notes (Signed)
Occupational Therapy Weekly Progress Note  Patient Details  Name: Katie Nunez MRN: [id number] Date of Birth: 07/24/1955  Beginning of progress report period: December 19, 2019 End of progress report period: January 03, 2020  Patient has met 4 of 4 short term goals.  Pt has made steady progress towards OT goals this week. Pt can complete functional transfers at a consistent min/mod A level. Sit<>stands have improved with R UE and RLE return. Pt is able to grasp some with R hand now, but is not able to use her R UE functionally yet. Continue current POC.  Patient continues to demonstrate the following deficits: muscle weakness, decreased cardiorespiratoy endurance, impaired timing and sequencing, abnormal tone, unbalanced muscle activation and decreased coordination, decreased attention to right and decreased sitting balance, decreased standing balance, decreased postural control, hemiplegia and decreased balance strategies and therefore will continue to benefit from skilled OT intervention to enhance overall performance with BADL and Reduce care partner burden.  Patient progressing toward long term goals..  Continue plan of care.  OT Short Term Goals Week 2:  OT Short Term Goal 1 (Week 2): Pt will don pants over feet with mod A. OT Short Term Goal 1 - Progress (Week 2): Met OT Short Term Goal 2 (Week 2): Pt will stand at toilet with mod A of 1. OT Short Term Goal 2 - Progress (Week 2): Met OT Short Term Goal 3 (Week 2): Patient will reposition R UE prior to transfer with min questioning cues OT Short Term Goal 3 - Progress (Week 2): Met OT Short Term Goal 4 (Week 2): Pt will complete self ROM with min cues OT Short Term Goal 4 - Progress (Week 2): Met Week 3:  OT Short Term Goal 1 (Week 3): LTG=STG 2/2 ELOS   Therapy/Group: Individual Therapy   S  01/03/2020, 9:56 AM   

## 2020-01-04 ENCOUNTER — Inpatient Hospital Stay (HOSPITAL_COMMUNITY): Payer: Medicare Other | Admitting: Physical Therapy

## 2020-01-04 ENCOUNTER — Inpatient Hospital Stay (HOSPITAL_COMMUNITY): Payer: Medicare Other | Admitting: Occupational Therapy

## 2020-01-04 MED ORDER — HYDROCERIN EX CREA
TOPICAL_CREAM | Freq: Two times a day (BID) | CUTANEOUS | Status: DC
  Administered 2020-01-07: 1 via TOPICAL
  Filled 2020-01-04: qty 113

## 2020-01-04 MED ORDER — HYDROCORTISONE 1 % EX LOTN: TOPICAL_LOTION | Freq: Two times a day (BID) | CUTANEOUS | Status: DC

## 2020-01-04 MED ORDER — SENNOSIDES-DOCUSATE SODIUM 8.6-50 MG PO TABS
1.0000 | ORAL_TABLET | Freq: Two times a day (BID) | ORAL | Status: DC
  Administered 2020-01-05 – 2020-01-19 (×20): 1 via ORAL
  Filled 2020-01-04 (×27): qty 1

## 2020-01-04 NOTE — Progress Notes (Signed)
Occupational Therapy Session Note  Patient Details  Name: Katie Nunez MRN: [id number] Date of Birth: [birth year]  Today's Date: 01/04/2020 OT Individual Time: 1320-1435 OT Individual Time Calculation (min): 75 min    Short Term Goals: Week 2:  OT Short Term Goal 1 (Week 2): Pt will don pants over feet with mod A. OT Short Term Goal 1 - Progress (Week 2): Met OT Short Term Goal 2 (Week 2): Pt will stand at toilet with mod A of 1. OT Short Term Goal 2 - Progress (Week 2): Met OT Short Term Goal 3 (Week 2): Patient will reposition R UE prior to transfer with min questioning cues OT Short Term Goal 3 - Progress (Week 2): Met OT Short Term Goal 4 (Week 2): Pt will complete self ROM with min cues OT Short Term Goal 4 - Progress (Week 2): Met  Skilled Therapeutic Interventions/Progress Updates:    Pt greeted in [place] with nursing staff after going to the bathroom and agreeable to handoff to OT. Patient brought over to the wc in [place], then OT propelled pt to therapy gym. R UE NMR with weight bearing towel pushes in standing. Progressed to working on elbow flex/ext with towel glides in sitting across table. R UE NMR with cup stacking activity. Pt needed OT assist to place R hand on cup, but was then able to maintain grasp and glide across table, but needed OT assist for elbow flexion to place cup.  OT placed SAEBO e-stim on shoulder for subluxation. 330 pulse width 35 Hz pulse rate On 8 sec/ off 8 sec Ramp up/ down 2 sec Symmetrical Biphasic wave form  Max intensity 18m at 500 Ohm load  Self ROM with focus on elbow flex/ext and pronation/supination. Worked on wc propulsion using hemi techniques to return to the room. Pt left seated in wc with R UE supported on 1/2 lap tray, alarm belt on and lunch set-up.  Therapy Documentation Precautions:  Precautions Precautions: Fall Precaution Comments: Rt hemiplegia - subluxation Restrictions Weight Bearing Restrictions: No Pain:  denies  pain  Therapy/Group: Individual Therapy  EValma Cava12/24/2021, 1:43 PM

## 2020-01-04 NOTE — Progress Notes (Signed)
Patient ID: Katie Nunez, female   DOB: [birth year], 64 y.o.   MRN: [id number]  [facility] Progress Note   Assessment/ Plan:   1.  Intracerebral hemorrhage: Suspected to be related to microvascular disease and recently started Coumadin therapy.  Coumadin currently on hold just with repeat CT scan of the head negative for extension of hematoma.  With significant neurological deficits of the right upper and lower extremities for which she is seen CIR for intensive PT/OT. 2. ESRD: She is usually on a TTS dialysis schedule, underwent hemodialysis yesterday without problems and will undertake her next hemodialysis again on Sunday.  We will continue to follow dry weight closely as she has lost significantly mass while in the hospital. 3. Anemia: Hemoglobin and hematocrit within acceptable range-continue low-dose ESA.  No overt loss. 4. CKD-MBD: Continue Sensipar and Hectorol for PTH control, on calcium acetate for phosphorus binding with current calcium and phosphorus level at goal. 5. Nutrition: Continue current renal diet with protein supplementation with close reevaluation of estimated dry weight. 6. Hypertension: Blood pressure within acceptable range, she appears to be close to euvolemic on physical exam.  Subjective:   Reports to be feeling fair and is somewhat encouraged by improving strength of the upper extremity.  She expresses concern with her intrahospital weight loss.   Objective:   BP 109/71 (BP Location: Right Arm)   Pulse 97   Temp 98.9 F (37.2 C) (Oral)   Resp 20   Ht 5\' 5"  (1.651 m)   Wt 50.6 kg   LMP 11/15/2008   SpO2 98%   BMI 18.56 kg/m   Physical Exam: Gen: Comfortably sitting up in a wheelchair, eating breakfast. CVS: Pulse regular rhythm, normal rate, S1 and S2 normal without murmur/rubs Resp: Clear to auscultation bilaterally without distinct rales or rhonchi Abd: Soft, obese, nontender, bowel sounds normal Ext: No lower extremity edema.  Left  upper arm brachiocephalic fistula with intact dressings.  Labs: BMET Recent Labs  Lab 12/29/19 1111 01/01/20 1236 01/03/20 1434  NA 134* 135 134*  K 3.9 3.8 4.4  CL 100 95* 95*  CO2  --  24 24  GLUCOSE 74 87 101*  BUN 41* 54* 51*  CREATININE 6.60* 8.44* 7.32*  CALCIUM  --  9.8 10.4*  PHOS  --  5.1* 5.2*   CBC Recent Labs  Lab 12/28/19 1110 12/29/19 1111 01/01/20 1236 01/03/20 1433  WBC  --   --  3.4* 4.8  HGB 9.8* 12.6 9.6* 10.2*  HCT 30.1* 37.0 29.7* 32.2*  MCV  --   --  90.5 91.0  PLT  --   --  163 148*     Medications:    . ambrisentan  5 mg Oral Daily  . calcium acetate  1,334 mg Oral TID WC  . camphor-menthol   Topical BID  . Chlorhexidine Gluconate Cloth  6 each Topical BID  . cinacalcet  30 mg Oral Q T,Th,Sa-HD  . darbepoetin (ARANESP) injection - DIALYSIS  40 mcg Intravenous Q Thu-HD  . doxercalciferol  6 mcg Intravenous Q T,Th,Sa-HD  . feeding supplement (NEPRO CARB STEADY)  237 mL Oral BID BM  . Gerhardt's butt cream   Topical QID  . hydrocerin   Topical BID  . hydrocortisone  25 mg Rectal BID  . loratadine  10 mg Oral Daily  . pentoxifylline  400 mg Oral Daily  . psyllium  1 packet Oral BID  . Selexipag  800 mcg Oral BID  . sodium  chloride  1 spray Each Nare 5 X Daily  . tadalafil  20 mg Oral Daily  . triamcinolone 0.1 % cream : eucerin   Topical BID   Elmarie Shiley, MD 01/04/2020, 10:20 AM

## 2020-01-04 NOTE — Progress Notes (Deleted)
Occupational Therapy Session Note  Patient Details  Name: Katie Nunez MRN: [id number] Date of Birth: [birth year]  Today's Date: 01/04/2020 OT Individual Time: 2787-1836 OT Individual Time Calculation (min): 74 min    Short Term Goals: Week 6:   LTG=STG 2/2 ELOS  Skilled Therapeutic Interventions/Progress Updates:    Patient greeted semi-reclined in bed and agreeable to OT treatment session focused on self-care retraining and L UE NMR.Pt ambulated to bathroom with Min HHA and transferred onto commode. Pt voided bladder and completed peri-care. Bathing completed from shower bench with set-up A and and hand over hand A to integrate L UE into bathing tasks. Dressing from wc with min A for hemi dressing with verbal cues to recall techniques. Pt ate her lunch seated in wc with min verbal cues for bite sizes. Pt ambulated around the unit with min HHA, but occasional mod A when distracted. Applied 1:1 NMES to CH1 wrist extensors.  Ratio 1:1 Rate 35 pps Waveform- Asymmetric Ramp 1.0 Pulse 300 CH1 Intensity- 15  Duration -  30  Pt returned to bed and left semi-reclined in bed with needs met.  OT returned in 30 minutes to remove e-stim with skin intact and no adverse reactions.   Therapy Documentation Precautions:  Precautions Precautions: Fall Precaution Comments: Rt hemiplegia - subluxation Restrictions Weight Bearing Restrictions: No Pain:   denies pain  Therapy/Group: Individual Therapy  Valma Cava 01/04/2020, 1:14 PM

## 2020-01-04 NOTE — Plan of Care (Signed)
  Problem: Consults Goal: RH STROKE PATIENT EDUCATION Description: See Patient Education module for education specifics  Outcome: Progressing   Problem: RH BOWEL ELIMINATION Goal: RH STG MANAGE BOWEL WITH ASSISTANCE Description: STG Manage Bowel with [place]. Outcome: Progressing   Problem: RH SKIN INTEGRITY Goal: RH STG SKIN FREE OF INFECTION/BREAKDOWN Description: No new breakdown with min assist/cues  Outcome: Progressing Goal: RH STG ABLE TO PERFORM INCISION/WOUND CARE W/ASSISTANCE Description: STG Able To Perform Incision/Wound Care With Mod Assistance. Outcome: Progressing   Problem: RH COGNITION-NURSING Goal: RH STG ANTICIPATES NEEDS/CALLS FOR ASSIST W/ASSIST/CUES Description: STG Anticipates Needs/Calls for Assist With Min Assistance/Cues. Outcome: Progressing   Problem: RH PAIN MANAGEMENT Goal: RH STG PAIN MANAGED AT OR BELOW PT'S PAIN GOAL Description: < 4 out of 10.  Outcome: Progressing   Problem: RH KNOWLEDGE DEFICIT Goal: RH STG INCREASE KNOWLEDGE OF STROKE PROPHYLAXIS Description: Pt will demonstrate increased understanding of stroke prevention methods including chronic disease management, diet management, medication management, and follow up care with primary physician using the educational materials given while in rehab.  Outcome: Progressing

## 2020-01-04 NOTE — Progress Notes (Signed)
Physical Therapy Session Note  Patient Details  Name: Katie Nunez MRN: [id number] Date of Birth: March 11, 1955  Today's Date: 01/04/2020 PT Individual Time: 0800-0900; 1000-1100 PT Individual Time Calculation (min): 60 min and 60 min  Short Term Goals: Week 2:  PT Short Term Goal 1 (Week 2): Pt will perform bed mobility with minA. PT Short Term Goal 2 (Week 2): Pt will perform sit to stand with minA. PT Short Term Goal 3 (Week 2): Pt will perform bed to chair transfer with minA. PT Short Term Goal 4 (Week 2): Pt will ambulates 3' with modA.  Skilled Therapeutic Interventions/Progress Updates:    Session 1: Pt received seated in w/c in room, agreeable to PT session. No complaints of pain. Dependent transport via w/c to therapy gym for time conservation. Sit to stand with min A to L handrail in hallway throughout session. Ambulation 3 x 30 ft with mod A with L handrail in hallway with close w/c follow for safety. Pt requires assist for balance, lateral weight shift, and to advance RLE. Use of DF assist wrap to assist during gait. Pt exhibits decreased R knee control, foot drop, and decreased weight shift onto RLE during gait. With onset of fatigue pt exhibits decreased control of R quad. Standing mini-squats 3 x 10 reps with use of RW for BUE WBing, mirror for visual feedback, manual and verbal cues for R quad and glute activation. Pt returned to room at end of session. Pt left seated in w/c in room with needs in reach, quick release belt and chair alarm in place, RUE on half lap tray.  Session 2: Pt received seated in w/c room, agreeable to PT session. No complaints of pain. Manual w/c propulsion x 100 ft with use of L UE/LE at Supervision level before onset of fatigue. Sit to stand with min A to RW for placement of LiteGait sling. Pt able to step up onto treadmill while in LiteGait with mod A needed for balance and RLE management. Ambulation 2 x 10 ft on treadmill while in body weight  supported system of LiteGait. Pt requires assist for RLE advancement as well as lateral weight shift onto RLE. Pt exhibits decreased step length with LLE due to decreased weight shift off of limb. Pt reports urge to use the bathroom at end of session. Sit to stand with min A to stedy. Stedy transfer to elevated BSC over toilet. Pt is min A for clothing management. Pt left seated on commode in bathroom, call button in reach, NT aware of pt's location.  Therapy Documentation Precautions:  Precautions Precautions: Fall Precaution Comments: Rt hemiplegia - subluxation Restrictions Weight Bearing Restrictions: No   Therapy/Group: Individual Therapy   Excell Seltzer, PT, DPT  01/04/2020, 12:18 PM

## 2020-01-04 NOTE — Progress Notes (Signed)
Physical Therapy Weekly Progress Note  Patient Details  Name: Katie Nunez MRN: [id number] Date of Birth: 01/22/1955  Beginning of progress report period: December 27, 2019 End of progress report period: January 04, 2020  Today's Date: 01/04/2020 PT Individual Time: 1000-1100 PT Individual Time Calculation (min): 60 min   Patient has met 2 of 4 short term goals.  Pt is making good progress towards therapy goals with recent recovery of some active movement in RLE. Pt is min to mod A overall for bed mobility and transfers. Pt is able to ambulate up to 30 ft with either RW with R hand splint or with L handrail in hallway. Pt is at Supervision level for w/c mobility. Pt exhibits great motivation and participation in therapy sessions.  Patient continues to demonstrate the following deficits muscle weakness, decreased cardiorespiratoy endurance, impaired timing and sequencing, abnormal tone and unbalanced muscle activation and decreased standing balance, decreased postural control, hemiplegia and decreased balance strategies and therefore will continue to benefit from skilled PT intervention to increase functional independence with mobility.  Patient progressing toward long term goals..  Continue plan of care.  PT Short Term Goals Week 2:  PT Short Term Goal 1 (Week 2): Pt will perform bed mobility with minA. PT Short Term Goal 1 - Progress (Week 2): Progressing toward goal PT Short Term Goal 2 (Week 2): Pt will perform sit to stand with minA. PT Short Term Goal 2 - Progress (Week 2): Met PT Short Term Goal 3 (Week 2): Pt will perform bed to chair transfer with minA. PT Short Term Goal 3 - Progress (Week 2): Progressing toward goal PT Short Term Goal 4 (Week 2): Pt will ambulates 29' with modA. PT Short Term Goal 4 - Progress (Week 2): Met Week 3:  PT Short Term Goal 1 (Week 3): =LTG due to ELOS  Skilled Therapeutic Interventions/Progress Updates:  Ambulation/gait training;Cognitive  remediation/compensation;Discharge planning;DME/adaptive equipment instruction;Functional mobility training;Pain management;Psychosocial support;Splinting/orthotics;Therapeutic Activities;UE/LE Strength taining/ROM;Visual/perceptual remediation/compensation;Balance/vestibular training;Community reintegration;Disease management/prevention;Functional electrical stimulation;Neuromuscular re-education;Skin care/wound management;Patient/family education;Stair training;Therapeutic Exercise;UE/LE Coordination activities;Wheelchair propulsion/positioning   Therapy Documentation Precautions:  Precautions Precautions: Fall Precaution Comments: Rt hemiplegia - subluxation Restrictions Weight Bearing Restrictions: No   Therapy/Group: Individual Therapy   Excell Seltzer, PT, DPT 01/04/2020, 12:45 PM

## 2020-01-04 NOTE — Progress Notes (Signed)
[facility] PHYSICAL MEDICINE & REHABILITATION PROGRESS NOTE   Subjective/Complaints:  Pt reports didn't get Eucerin cream?? Using metamucil- but soft/hard to push out- like glue  RLE can now lift and RUE- can squeeze fingers now.   Appears my eucerin with cortisone cream got d/c;d.    ROS:   Pt denies SOB, abd pain, CP, N/V/C/D, and vision changes   Objective:   No results found. Recent Labs    01/01/20 1236 01/03/20 1433  WBC 3.4* 4.8  HGB 9.6* 10.2*  HCT 29.7* 32.2*  PLT 163 148*   Recent Labs    01/01/20 1236 01/03/20 1434  NA 135 134*  K 3.8 4.4  CL 95* 95*  CO2 24 24  GLUCOSE 87 101*  BUN 54* 51*  CREATININE 8.44* 7.32*  CALCIUM 9.8 10.4*    Intake/Output Summary (Last 24 hours) at 01/04/2020 1020 Last data filed at 01/04/2020 0800 Gross per 24 hour  Intake 360 ml  Output 1500 ml  Net -1140 ml     Pressure Injury 12/18/19 Sacrum Medial Stage 1 -  Intact skin with non-blanchable redness of a localized area usually over a bony prominence. Red, non-blanchable spot over sacrum area (Active)  12/18/19 1707  Location: Sacrum  Location Orientation: Medial  Staging: Stage 1 -  Intact skin with non-blanchable redness of a localized area usually over a bony prominence.  Wound Description (Comments): Red, non-blanchable spot over sacrum area  Present on Admission: Yes     Physical Exam: Gen: sitting up in bed, off O2, no nasal bleeding, NAD HEENT: oral mucosa pink and moist, NCAT Cardio: RRR Chest: CTA B/L- no W/R/R- good air movement Abd: Soft, NT, ND, (+)BS   Ext: no edema Psych: pleasant and cooperative- appropriate Skin:   LUE fistula intact. Left calf wound dressed. mild sacral redness present- also has a red area on R lower leg- no increased heat, no cellulitis seen Neuro: Pt is cognitively appropriate with normal insight, memory, and awareness. Cranial nerves 2-12 are intact. Seems to sense pain and LT in all 4's. Reflexes are 2+ on left. 3+  right. Fine motor coordination is intact. No tremors. Motor function is grossly 5/5 LUE and LLE.  RUE  0/5 (?tr delt).  RLE 1 Hamstrings and HAD, otherwise 0/5 distally Musculoskeletal: minimal jt tenderness today   Vital Signs Blood pressure 109/71, pulse 97, temperature 98.9 F (37.2 C), temperature source Oral, resp. rate 20, height 5\' 5"  (1.651 m), weight 50.6 kg, last menstrual period 11/15/2008, SpO2 98 %.  Assessment/Plan: 1. Functional deficits which require 3+ hours per day of interdisciplinary therapy in a comprehensive inpatient rehab setting.  Physiatrist is providing close team supervision and 24 hour management of active medical problems listed below.  Physiatrist and rehab team continue to assess barriers to discharge/monitor patient progress toward functional and medical goals  Care Tool:  Bathing    Body parts bathed by patient: Chest,Abdomen,Right upper leg,Left upper leg,Right arm,Face,Front perineal area,Buttocks,Left arm   Body parts bathed by helper: Right lower leg,Left lower leg     Bathing assist Assist Level: Minimal Assistance - Patient > 75%     Upper Body Dressing/Undressing Upper body dressing   What is the patient wearing?: Bra,Pull over shirt    Upper body assist Assist Level: Moderate Assistance - Patient 50 - 74%    Lower Body Dressing/Undressing Lower body dressing      What is the patient wearing?: Pants     Lower body assist Assist for  lower body dressing: Moderate Assistance - Patient 50 - 74%     Toileting Toileting    Toileting assist Assist for toileting: Total Assistance - Patient < 25%     Transfers Chair/bed transfer  Transfers assist     Chair/bed transfer assist level: Moderate Assistance - Patient 50 - 74%     Locomotion Ambulation   Ambulation assist      Assist level: Maximal Assistance - Patient 25 - 49% Assistive device: Walker-rolling Max distance: 25'   Walk 10 feet activity   Assist      Assist level: Maximal Assistance - Patient 25 - 49% Assistive device: Walker-rolling   Walk 50 feet activity   Assist Walk 50 feet with 2 turns activity did not occur: Safety/medical concerns         Walk 150 feet activity   Assist Walk 150 feet activity did not occur: Safety/medical concerns         Walk 10 feet on uneven surface  activity   Assist Walk 10 feet on uneven surfaces activity did not occur: Safety/medical concerns         Wheelchair     Assist Will patient use wheelchair at discharge?: Yes Type of Wheelchair: Manual Wheelchair activity did not occur: Safety/medical concerns         Wheelchair 50 feet with 2 turns activity    Assist    Wheelchair 50 feet with 2 turns activity did not occur: Safety/medical concerns       Wheelchair 150 feet activity     Assist  Wheelchair 150 feet activity did not occur: Safety/medical concerns       Blood pressure 109/71, pulse 97, temperature 98.9 F (37.2 C), temperature source Oral, resp. rate 20, height 5\' 5"  (1.651 m), weight 50.6 kg, last menstrual period 11/15/2008, SpO2 98 %.  Medical Problem List and Plan: 1.  L frontal ICH secondary to recent starting of Coumadin- Coumadin on hold with R hemiplegia             -patient may shower             -ELOS/Goals: 12/31  -Continue CIR therapies including PT, OT, and SLP.    2.  Portal vein thrombosis/A fib/Antithrombotics: -DVT/anticoagulation:  Pharmaceutical: SCDs due to thrombocytopenia and recent bleed.  --per neuro no coumadin-->will need to wait at least one month due to size of bleed.  --dopplers ok             -antiplatelet therapy: N/A 3. Pain Management: tylenol prn.   - kpad for neck pain  -will replace robaxin with flexeril prn  -may have tramadol q8 prn  -12/17 right shoulder/neck pain still an issue off an on. Would like 50mg  tramadol   -will increase tramadol to 50mg  q8 prn  12/20- pain controlled- con't  regimen   -gabapentin 100mg  bid prn initiated this weekend with results  12/22: complains of muscle cramps- can use kpad.   12/24- pain controlled- con't meds 4. Mood: LCSW to follow up for evaluation and support.              -antipsychotic agents: N/A  -add trazodone qhs prn for sleep 5. Neuropsych: This patient is capable of making decisions on her own behalf. 6. Wound ulcer/Skin/Wound Care:Pack daily wound care LLE.    -turning, nutrition, education  12/14 -added scheduled benadryl for itching. Also receiving sarna        -prn vistaril  12/23- added eucerin with cortisone in it-  will apply BID- see if that helps.   12/24- added eucerin and con't eucerin with cortisone.  7. Fluids/Electrolytes/Nutrition: Strict I/O.  Labs with HD TTS reviewed, continue to monitor.  8. ESRD: Continue HD on TTS at the end of the day to help with therapy tolerance.  9. HTN: Monitor BP tid-currently controlled on current regimen 10. Pulmonary HTN: Oxygen dependent. Continue Ambrisentan and Selexipag. On Home dose 2L by [place] 11. Cirrhosis of the liver: Monitor platelets and for any signs of bleeding. Reporting abdominal distension.  (protal vein thrombus)  -f/u abd CT as outpt 12. Chronic systolic CHF: Monitor daily weights as well as I/O. Continue tadalafil.    Filed Weights   01/03/20 1414 01/03/20 1744 01/04/20 0530  Weight: 52.9 kg 51.4 kg 50.6 kg    -volume mgt per nephro  12/23- down 1 kg- per pt, is at college weight right now 13. CAF: Monitor HR tid--metoprolol has been on hold. Coumadin to be held due to [place].  14. Pancytopenia:  Continue to monitor with serial checks.   12/20--inconsistent H&H levels--up to 12 on 12/18!  12/24- WBC better; but plts down to 148- will monitor esp with previous nose bleeds  15. Constipation WITH hemorrhoids- seems to cycle between loose stool  -prn miralax only  12/13 pt with mushy,loose stools yet seems to report constipation   -backed of scheduled senna-s for  now   -added daily fiber packet  -encouraging regular solid/liquid intake as well  -stopped amitiza 12/14  -resumed senna-s at bedtime 12/15 16. Significant epistaxis (pt has prior history)  12/16 -recurrent bleeding in right nose---stopped with rocket   -ENT Benjamine Mola) is planning on potential cauterization this weekend  12/18-Dr. Benjamine Mola cauterized hypervascular areas in nasal septi  12/20-packing out today  12/22: appreciate follow-up, healing well, f/u with Dr. Benjamine Mola outpatient.  12/23- bleeding resolved except for trace "crusties".  17. Insomnia:   12/20: continue 50mg  trazodone qhs prn 18. Right neck nodule appears resolved. Likely node  19. Bowel issues  12/24- will increase senokot from 1t ab prn to BID.   LOS: 17 days A FACE TO FACE EVALUATION WAS PERFORMED  Lysander Calixte 01/04/2020, 10:20 AM

## 2020-01-05 NOTE — Progress Notes (Signed)
Patient ID: Katie Nunez, female   DOB: 1955-12-30, 64 y.o.   MRN: [id number]  [facility] Progress Note   Assessment/ Plan:   1.  Intracerebral hemorrhage: Suspected to be related to microvascular disease and recently started Coumadin therapy.  Coumadin currently on hold just with repeat CT scan of the head negative for extension of hematoma.  With significant neurological deficits of the right upper and lower extremities -now in CIR for intensive PT/OT. 2. ESRD: She is usually on a TTS dialysis schedule, underwent hemodialysis 12/23 without problems and will undertake her next hemodialysis tomorrow.  We will adjust her dry weight as she has lost significantly mass while in the hospital. 3. Anemia: Hemoglobin and hematocrit within acceptable range-continue low-dose ESA.  No overt loss. 4. CKD-MBD: Continue Sensipar and Hectorol for PTH control, on calcium acetate for phosphorus binding with current calcium and phosphorus level at goal. 5. Nutrition: Continue current renal diet with protein supplementation with close reevaluation of estimated dry weight. 6. Hypertension: Blood pressure within acceptable range, she appears to be close to euvolemic on physical exam.  Subjective:   No acute events overnight and remains motivated by PT/OT progress.   Objective:   BP (!) 103/52 (BP Location: Right Arm)   Pulse 91   Temp 98.7 F (37.1 C) (Oral)   Resp 18   Ht 5\' 5"  (1.651 m)   Wt 51 kg   LMP 11/15/2008   SpO2 91%   BMI 18.71 kg/m   Physical Exam: Gen: Comfortably resting in bed, eating breakfast. CVS: Pulse regular rhythm, normal rate, S1 and S2 normal without murmur/rubs Resp: Clear to auscultation bilaterally without distinct rales or rhonchi Abd: Soft, obese, nontender, bowel sounds normal Ext: No lower extremity edema.  Left upper arm brachiocephalic fistula with intact dressings.  Labs: BMET Recent Labs  Lab 12/29/19 1111 01/01/20 1236 01/03/20 1434  NA  134* 135 134*  K 3.9 3.8 4.4  CL 100 95* 95*  CO2  --  24 24  GLUCOSE 74 87 101*  BUN 41* 54* 51*  CREATININE 6.60* 8.44* 7.32*  CALCIUM  --  9.8 10.4*  PHOS  --  5.1* 5.2*   CBC Recent Labs  Lab 12/29/19 1111 01/01/20 1236 01/03/20 1433  WBC  --  3.4* 4.8  HGB 12.6 9.6* 10.2*  HCT 37.0 29.7* 32.2*  MCV  --  90.5 91.0  PLT  --  163 148*     Medications:    . ambrisentan  5 mg Oral Daily  . calcium acetate  1,334 mg Oral TID WC  . camphor-menthol   Topical BID  . Chlorhexidine Gluconate Cloth  6 each Topical BID  . cinacalcet  30 mg Oral Q T,Th,Sa-HD  . darbepoetin (ARANESP) injection - DIALYSIS  40 mcg Intravenous Q Thu-HD  . doxercalciferol  6 mcg Intravenous Q T,Th,Sa-HD  . feeding supplement (NEPRO CARB STEADY)  237 mL Oral BID BM  . Gerhardt's butt cream   Topical QID  . hydrocerin   Topical BID  . hydrocerin   Topical BID  . hydrocortisone  25 mg Rectal BID  . loratadine  10 mg Oral Daily  . pentoxifylline  400 mg Oral Daily  . psyllium  1 packet Oral BID  . Selexipag  800 mcg Oral BID  . senna-docusate  1 tablet Oral BID  . sodium chloride  1 spray Each Nare 5 X Daily  . tadalafil  20 mg Oral Daily  . triamcinolone 0.1 %  cream : eucerin   Topical BID   Elmarie Shiley, MD 01/05/2020, 8:50 AM

## 2020-01-05 NOTE — Plan of Care (Signed)
°  Problem: Consults Goal: RH STROKE PATIENT EDUCATION Description: See Patient Education module for education specifics  Outcome: Progressing   Problem: RH BOWEL ELIMINATION Goal: RH STG MANAGE BOWEL WITH ASSISTANCE Description: STG Manage Bowel with [place]. Outcome: Progressing   Problem: RH SKIN INTEGRITY Goal: RH STG SKIN FREE OF INFECTION/BREAKDOWN Description: No new breakdown with min assist/cues  Outcome: Progressing Goal: RH STG ABLE TO PERFORM INCISION/WOUND CARE W/ASSISTANCE Description: STG Able To Perform Incision/Wound Care With Mod Assistance. Outcome: Progressing   Problem: RH COGNITION-NURSING Goal: RH STG ANTICIPATES NEEDS/CALLS FOR ASSIST W/ASSIST/CUES Description: STG Anticipates Needs/Calls for Assist With Min Assistance/Cues. Outcome: Progressing   Problem: RH PAIN MANAGEMENT Goal: RH STG PAIN MANAGED AT OR BELOW PT'S PAIN GOAL Description: < 4 out of 10.  Outcome: Progressing   Problem: RH KNOWLEDGE DEFICIT Goal: RH STG INCREASE KNOWLEDGE OF STROKE PROPHYLAXIS Description: Pt will demonstrate increased understanding of stroke prevention methods including chronic disease management, diet management, medication management, and follow up care with primary physician using the educational materials given while in rehab.  Outcome: Progressing

## 2020-01-05 NOTE — Progress Notes (Addendum)
[facility] PHYSICAL MEDICINE & REHABILITATION PROGRESS NOTE   Subjective/Complaints:  No issues overnite  ROS:   Pt denies SOB, abd pain, CP, N/V/C/D, and vision changes   Objective:   No results found. Recent Labs    01/03/20 1433  WBC 4.8  HGB 10.2*  HCT 32.2*  PLT 148*   Recent Labs    01/03/20 1434  NA 134*  K 4.4  CL 95*  CO2 24  GLUCOSE 101*  BUN 51*  CREATININE 7.32*  CALCIUM 10.4*    Intake/Output Summary (Last 24 hours) at 01/05/2020 0719 Last data filed at 01/05/2020 0400 Gross per 24 hour  Intake 780 ml  Output --  Net 780 ml     Pressure Injury 12/18/19 Sacrum Medial Stage 1 -  Intact skin with non-blanchable redness of a localized area usually over a bony prominence. Red, non-blanchable spot over sacrum area (Active)  12/18/19 1707  Location: Sacrum  Location Orientation: Medial  Staging: Stage 1 -  Intact skin with non-blanchable redness of a localized area usually over a bony prominence.  Wound Description (Comments): Red, non-blanchable spot over sacrum area  Present on Admission: Yes     Physical Exam:  General: No acute distress Mood and affect are appropriate Heart: Regular rate and rhythm no rubs murmurs or extra sounds Lungs: Clear to auscultation, breathing unlabored, no rales or wheezes Abdomen: Positive bowel sounds, soft nontender to palpation, nondistended Extremities: No clubbing, cyanosis, or edema Skin: No evidence of breakdown, no evidence of rash  Psych: pleasant and cooperative- appropriate Skin:   LUE fistula intact. Left calf wound dressed. mild sacral redness present- also has a red area on R lower leg- no increased heat, no cellulitis seen Neuro: Pt is cognitively appropriate with normal insight, memory, and awareness. intact LT in all 4's. Reflexes are 2+ on left. 3+ right. Fine motor coordination is intact. No tremors. Motor function is grossly 5/5 LUE and LLE.  RUE 2- elbow flex 2- finger flexors, trace tricep  0/5 wrist flex/ext, 3- R knee ext, 0/5 ankle  RLE 1 Hamstrings and HAD, otherwise 0/5 distally Musculoskeletal: minimal jt tenderness today   Vital Signs Blood pressure (!) 103/52, pulse 91, temperature 98.7 F (37.1 C), temperature source Oral, resp. rate 18, height 5\' 5"  (1.651 m), weight 51 kg, last menstrual period 11/15/2008, SpO2 91 %.  Assessment/Plan: 1. Functional deficits which require 3+ hours per day of interdisciplinary therapy in a comprehensive inpatient rehab setting.  Physiatrist is providing close team supervision and 24 hour management of active medical problems listed below.  Physiatrist and rehab team continue to assess barriers to discharge/monitor patient progress toward functional and medical goals  Care Tool:  Bathing    Body parts bathed by patient: Chest,Abdomen,Right upper leg,Left upper leg,Right arm,Face,Front perineal area,Buttocks,Left arm   Body parts bathed by helper: Right lower leg,Left lower leg     Bathing assist Assist Level: Minimal Assistance - Patient > 75%     Upper Body Dressing/Undressing Upper body dressing   What is the patient wearing?: Bra,Pull over shirt    Upper body assist Assist Level: Moderate Assistance - Patient 50 - 74%    Lower Body Dressing/Undressing Lower body dressing      What is the patient wearing?: Pants     Lower body assist Assist for lower body dressing: Moderate Assistance - Patient 50 - 74%     Toileting Toileting    Toileting assist Assist for toileting: Total Assistance - Patient <  25%     Transfers Chair/bed transfer  Transfers assist     Chair/bed transfer assist level: Moderate Assistance - Patient 50 - 74%     Locomotion Ambulation   Ambulation assist      Assist level: Moderate Assistance - Patient 50 - 74% Assistive device: Other (comment) (L handrail) Max distance: 30'   Walk 10 feet activity   Assist     Assist level: Moderate Assistance - Patient - 50 -  74% Assistive device: Other (comment) (L handrail)   Walk 50 feet activity   Assist Walk 50 feet with 2 turns activity did not occur: Safety/medical concerns         Walk 150 feet activity   Assist Walk 150 feet activity did not occur: Safety/medical concerns         Walk 10 feet on uneven surface  activity   Assist Walk 10 feet on uneven surfaces activity did not occur: Safety/medical concerns         Wheelchair     Assist Will patient use wheelchair at discharge?: Yes Type of Wheelchair: Manual Wheelchair activity did not occur: Safety/medical concerns  Wheelchair assist level: Supervision/Verbal cueing Max wheelchair distance: 100'    Wheelchair 50 feet with 2 turns activity    Assist    Wheelchair 50 feet with 2 turns activity did not occur: Safety/medical concerns   Assist Level: Supervision/Verbal cueing   Wheelchair 150 feet activity     Assist  Wheelchair 150 feet activity did not occur: Safety/medical concerns       Blood pressure (!) 103/52, pulse 91, temperature 98.7 F (37.1 C), temperature source Oral, resp. rate 18, height 5\' 5"  (1.651 m), weight 51 kg, last menstrual period 11/15/2008, SpO2 91 %.  Medical Problem List and Plan: 1.  L frontal ICH secondary to recent starting of Coumadin- Coumadin on hold with R hemiplegia             -patient may shower             -ELOS/Goals: 12/31  -Continue CIR therapies including PT, OT, and SLP.    2.  Portal vein thrombosis/A fib/Antithrombotics: -DVT/anticoagulation:  Pharmaceutical: SCDs due to thrombocytopenia and recent bleed.  --per neuro no coumadin-->will need to wait at least one month due to size of bleed.  --dopplers ok             -antiplatelet therapy: N/A 3. Pain Management: tylenol prn.   - kpad for neck pain  -will replace robaxin with flexeril prn  -may have tramadol q8 prn   12/25- pain controlled- con't meds 4. Mood: LCSW to follow up for evaluation and  support.              -antipsychotic agents: N/A  -add trazodone qhs prn for sleep 5. Neuropsych: This patient is capable of making decisions on her own behalf. 6. Wound ulcer/Skin/Wound Care:Pack daily wound care LLE.    -turning, nutrition, education  12/14 -added scheduled benadryl for itching. Also receiving sarna        -prn vistaril  12/23- added eucerin with cortisone in it- will apply BID- see if that helps.   12/24- added eucerin and con't eucerin with cortisone.  7. Fluids/Electrolytes/Nutrition: Strict I/O.  Labs with HD TTS reviewed, continue to monitor.  8. ESRD: Continue HD on TTS at the end of the day to help with therapy tolerance.  9. HTN: Monitor BP tid-currently controlled on current regimen 10. Pulmonary HTN: Oxygen dependent.  Continue Ambrisentan and Selexipag. On Home dose 2L by [place] 11. Cirrhosis of the liver: Monitor platelets and for any signs of bleeding. Reporting abdominal distension.  (protal vein thrombus)  -f/u abd CT as outpt 12. Chronic systolic CHF: Monitor daily weights as well as I/O. Continue tadalafil.    Filed Weights   01/03/20 1744 01/04/20 0530 01/05/20 0342  Weight: 51.4 kg 50.6 kg 51 kg    -volume mgt per nephro  12/23- down 1 kg- per pt, is at [employer] weight right now 13. CAF: Monitor HR tid--metoprolol has been on hold. Coumadin to be held due to [place].  14. Pancytopenia:  Continue to monitor with serial checks.   Mild cont to monitor   15. Constipation WITH hemorrhoids- seems to cycle between loose stool  -prn miralax only  12/13 pt with mushy,loose stools yet seems to report constipation   -backed of scheduled senna-s for now   -added daily fiber packet  -encouraging regular solid/liquid intake as well  -stopped amitiza 12/14  -resumed senna-s at bedtime 12/15 16. Significant epistaxis (pt has prior history)  12/16 -recurrent bleeding in right nose---stopped with rocket   -ENT Benjamine Mola) is planning on potential cauterization this  weekend  12/18-Dr. Benjamine Mola cauterized hypervascular areas in nasal septi  12/20-packing out today  12/22: appreciate follow-up, healing well, f/u with Dr. Benjamine Mola outpatient.  12/23- bleeding resolved except for trace "crusties".  17. Insomnia:   12/20: continue 50mg  trazodone qhs prn 18. Right neck nodule appears resolved. Likely node  19. Bowel issues  12/24- will increase senokot from 1t ab prn to BID.   LOS: 18 days A FACE TO [place] E Armel Rabbani 01/05/2020, 7:19 AM

## 2020-01-06 ENCOUNTER — Inpatient Hospital Stay (HOSPITAL_COMMUNITY): Payer: Medicare Other | Admitting: Physical Therapy

## 2020-01-06 ENCOUNTER — Inpatient Hospital Stay (HOSPITAL_COMMUNITY): Payer: Medicare Other

## 2020-01-06 LAB — CBC
HCT: 31.2 % — ABNORMAL LOW (ref 36.0–46.0)
Hemoglobin: 10.4 g/dL — ABNORMAL LOW (ref 12.0–15.0)
MCH: 30.4 pg (ref 26.0–34.0)
MCHC: 33.3 g/dL (ref 30.0–36.0)
MCV: 91.2 fL (ref 80.0–100.0)
Platelets: 143 10*3/uL — ABNORMAL LOW (ref 150–400)
RBC: 3.42 MIL/uL — ABNORMAL LOW (ref 3.87–5.11)
RDW: 15.9 % — ABNORMAL HIGH (ref 11.5–15.5)
WBC: 4.8 10*3/uL (ref 4.0–10.5)
nRBC: 0 % (ref 0.0–0.2)

## 2020-01-06 LAB — RENAL FUNCTION PANEL
Albumin: 3.2 g/dL — ABNORMAL LOW (ref 3.5–5.0)
Anion gap: 15 (ref 5–15)
BUN: 41 mg/dL — ABNORMAL HIGH (ref 8–23)
CO2: 24 mmol/L (ref 22–32)
Calcium: 9.5 mg/dL (ref 8.9–10.3)
Chloride: 97 mmol/L — ABNORMAL LOW (ref 98–111)
Creatinine, Ser: 6.12 mg/dL — ABNORMAL HIGH (ref 0.44–1.00)
GFR, Estimated: 7 mL/min — ABNORMAL LOW (ref >60)
Glucose, Bld: 110 mg/dL — ABNORMAL HIGH (ref 70–99)
Phosphorus: 3.8 mg/dL (ref 2.5–4.6)
Potassium: 3.2 mmol/L — ABNORMAL LOW (ref 3.5–5.1)
Sodium: 136 mmol/L (ref 135–145)

## 2020-01-06 MED ORDER — CINACALCET HCL 30 MG PO TABS
ORAL_TABLET | ORAL | Status: AC
  Administered 2020-01-06: 30 mg via ORAL
  Filled 2020-01-06: qty 1

## 2020-01-06 MED ORDER — DOXERCALCIFEROL 4 MCG/2ML IV SOLN
INTRAVENOUS | Status: AC
  Administered 2020-01-06: 6 ug via INTRAVENOUS
  Filled 2020-01-06: qty 4

## 2020-01-06 NOTE — Progress Notes (Signed)
Physical Therapy Session Note  Patient Details  Name: Katie Nunez MRN: [id number] Date of Birth: April 19, 1955  Today's Date: 01/06/2020 PT Individual Time: 1104-1200 PT Individual Time Calculation (min): 56 min   Short Term Goals: Week 3:  PT Short Term Goal 1 (Week 3): =LTG due to ELOS  Skilled Therapeutic Interventions/Progress Updates:   Pt received sitting in WC and agreeable to PT. Pt reports incontinence in brief. Stand pivot transfer to [facility] over toilet with mod assist to block the RLE. Sit<>stand at toilet with min assist and tactile cues to force WB through the RLE. PT performed clothing management while pt maintain LUE support on the rail. Per care also performed by PT while pt standing at toilet. Butt cream then applied by PT for skin protection.   Pt performed stand pivot transfer back to [facility] with mod assist to block the RLE. Ambulatory transfer to  nustep x 33f with DF wrap in place and RUE hand splint. Min-mod assist from PT to prevent R lateral lean, but able to advance the RLE and bear weight on the R side with LLE step. nustep reciprocal movement endurance training 4 min +3 min with therapeutic rest break between bouts. Min-mod assist from PT improve attention to the RLE and maintain neutral hip ER/IR. [facility]  WC mobility through hall x [id number] supervision assist and min cues for awareness of obstacles on the R.   Patient returned to room and left sitting in WC [facility] call bell in reach and all needs met.         Therapy Documentation Precautions:  Precautions Precautions: Fall Precaution Comments: Rt hemiplegia - subluxation Restrictions Weight Bearing Restrictions: No Pain:   denies    Therapy/Group: Individual Therapy  AusLorie Phenix/26/2021, 12:02 PM

## 2020-01-06 NOTE — Progress Notes (Signed)
Occupational Therapy Session Note  Patient Details  Name: Katie Nunez MRN: [id number] Date of Birth: 21-Apr-1955  Today's Date: 01/06/2020 OT Missed Time: 30 Minutes Missed Time Reason: Unavailable (comment) (pt in HD)  Pt in HD and unavailable for scheduled OT session. 60 min missed.   Curtis Sites 01/06/2020, 7:18 AM

## 2020-01-06 NOTE — Procedures (Signed)
Patient seen on Hemodialysis. BP (!) 117/57   Pulse 86   Temp 98 F (36.7 C) (Oral)   Resp 16   Ht 5\' 5"  (1.651 m)   Wt 52.5 kg   LMP 11/15/2008   SpO2 94%   BMI 19.26 kg/m   QB 400, UF goal 2.5L Tolerating treatment without complaints at this time.   Elmarie Shiley MD [facility]. Office # [phone] Pager # [phone] 2:19 PM

## 2020-01-06 NOTE — Plan of Care (Signed)
  Problem: Consults Goal: RH STROKE PATIENT EDUCATION Description: See Patient Education module for education specifics  Outcome: Progressing   Problem: RH BOWEL ELIMINATION Goal: RH STG MANAGE BOWEL WITH ASSISTANCE Description: STG Manage Bowel with [place]. Outcome: Progressing   Problem: RH SKIN INTEGRITY Goal: RH STG SKIN FREE OF INFECTION/BREAKDOWN Description: No new breakdown with min assist/cues  Outcome: Progressing Goal: RH STG ABLE TO PERFORM INCISION/WOUND CARE W/ASSISTANCE Description: STG Able To Perform Incision/Wound Care With Mod Assistance. Outcome: Progressing   Problem: RH COGNITION-NURSING Goal: RH STG ANTICIPATES NEEDS/CALLS FOR ASSIST W/ASSIST/CUES Description: STG Anticipates Needs/Calls for Assist With Min Assistance/Cues. Outcome: Progressing   Problem: RH PAIN MANAGEMENT Goal: RH STG PAIN MANAGED AT OR BELOW PT'S PAIN GOAL Description: < 4 out of 10.  Outcome: Progressing   Problem: RH KNOWLEDGE DEFICIT Goal: RH STG INCREASE KNOWLEDGE OF STROKE PROPHYLAXIS Description: Pt will demonstrate increased understanding of stroke prevention methods including chronic disease management, diet management, medication management, and follow up care with primary physician using the educational materials given while in rehab.  Outcome: Progressing

## 2020-01-06 NOTE — Progress Notes (Signed)
Patient ID: Katie Nunez, female   DOB: Apr 11, 1955, 64 y.o.   MRN: [id number]  [facility] Progress Note   Assessment/ Plan:   1.  Intracerebral hemorrhage: Suspected to be related to microvascular disease and recently started Coumadin therapy.  She remains off warfarin at this time with repeat CT scan of the head showing no extension of hematoma.  Because she suffered significant neurological deficits of the right upper and lower extremities, she was admitted to CIR for intensive PT/OT. 2. ESRD: She is usually on a TTS dialysis schedule, underwent hemodialysis 12/23 without problems and will undertake her next hemodialysis today per holiday schedule.  I will switch her over to a regular diet with continued fluid restriction to help improve selection variety.  Phosphorus/potassium level will be followed closely. 3. Anemia: Hemoglobin and hematocrit within acceptable range-continue low-dose ESA.  No overt loss. 4. CKD-MBD: Continue Sensipar and Hectorol for PTH control, on calcium acetate for phosphorus binding with current calcium and phosphorus level at goal. 5. Nutrition: Continue current renal diet with protein supplementation with close reevaluation of estimated dry weight. 6. Hypertension: Blood pressure within acceptable range, she appears to be close to euvolemic on physical exam.  Subjective:   No acute events overnight and requests regular diet instead of renal diet.   Objective:   BP 120/69 (BP Location: Right Arm)   Pulse 92   Temp 98.3 F (36.8 C) (Oral)   Resp 16   Ht 5\' 5"  (1.651 m)   Wt 50.8 kg   LMP 11/15/2008   SpO2 93%   BMI 18.64 kg/m   Physical Exam: Gen: Sitting up in bed eating breakfast CVS: Pulse regular rhythm, normal rate, S1 and S2 normal without murmur/rubs Resp: Clear to auscultation bilaterally without distinct rales or rhonchi Abd: Soft, obese, nontender, bowel sounds normal Ext: No lower extremity edema.  Left upper arm  brachiocephalic fistula with intact dressings.  Labs: BMET Recent Labs  Lab 01/01/20 1236 01/03/20 1434  NA 135 134*  K 3.8 4.4  CL 95* 95*  CO2 24 24  GLUCOSE 87 101*  BUN 54* 51*  CREATININE 8.44* 7.32*  CALCIUM 9.8 10.4*  PHOS 5.1* 5.2*   CBC Recent Labs  Lab 01/01/20 1236 01/03/20 1433  WBC 3.4* 4.8  HGB 9.6* 10.2*  HCT 29.7* 32.2*  MCV 90.5 91.0  PLT 163 148*     Medications:    . ambrisentan  5 mg Oral Daily  . calcium acetate  1,334 mg Oral TID WC  . camphor-menthol   Topical BID  . Chlorhexidine Gluconate Cloth  6 each Topical BID  . cinacalcet  30 mg Oral Q T,Th,Sa-HD  . darbepoetin (ARANESP) injection - DIALYSIS  40 mcg Intravenous Q Thu-HD  . doxercalciferol  6 mcg Intravenous Q T,Th,Sa-HD  . feeding supplement (NEPRO CARB STEADY)  237 mL Oral BID BM  . Gerhardt's butt cream   Topical QID  . hydrocerin   Topical BID  . hydrocerin   Topical BID  . hydrocortisone  25 mg Rectal BID  . loratadine  10 mg Oral Daily  . pentoxifylline  400 mg Oral Daily  . psyllium  1 packet Oral BID  . Selexipag  800 mcg Oral BID  . senna-docusate  1 tablet Oral BID  . sodium chloride  1 spray Each Nare 5 X Daily  . tadalafil  20 mg Oral Daily  . triamcinolone 0.1 % cream : eucerin   Topical BID   Ulice Dash  Posey Pronto, MD 01/06/2020, 8:44 AM

## 2020-01-06 NOTE — Progress Notes (Addendum)
[place] PHYSICAL MEDICINE & REHABILITATION PROGRESS NOTE   Subjective/Complaints:  Appreciate renal note, pt asking about when anticoag will be resumed No epistaxis for ~1wk  ROS:   Pt denies SOB, abd pain, CP, N/V/C/D, and vision changes   Objective:   No results found. Recent Labs    01/03/20 1433  WBC 4.8  HGB 10.2*  HCT 32.2*  PLT 148*   Recent Labs    01/03/20 1434  NA 134*  K 4.4  CL 95*  CO2 24  GLUCOSE 101*  BUN 51*  CREATININE 7.32*  CALCIUM 10.4*    Intake/Output Summary (Last 24 hours) at 01/06/2020 0656 Last data filed at 01/05/2020 2100 Gross per 24 hour  Intake 960 ml  Output --  Net 960 ml     Pressure Injury 12/18/19 Sacrum Medial Stage 1 -  Intact skin with non-blanchable redness of a localized area usually over a bony prominence. Red, non-blanchable spot over sacrum area (Active)  12/18/19 1707  Location: Sacrum  Location Orientation: Medial  Staging: Stage 1 -  Intact skin with non-blanchable redness of a localized area usually over a bony prominence.  Wound Description (Comments): Red, non-blanchable spot over sacrum area  Present on Admission: Yes     Physical Exam:   General: No acute distress Mood and affect are appropriate Heart: Regular rate and rhythm no rubs murmurs or extra sounds Lungs: Clear to auscultation, breathing unlabored, no rales or wheezes Abdomen: Positive bowel sounds, soft nontender to palpation, nondistended Extremities: No clubbing, cyanosis, or edema Skin: No evidence of breakdown, no evidence of rash  Psych: pleasant and cooperative- appropriate Skin:   LUE fistula intact. Left calf wound dressed. mild sacral redness present- also has a red area on R lower leg- no increased heat, no cellulitis seen Neuro: Pt is cognitively appropriate with normal insight, memory, and awareness. intact LT in all 4's. Reflexes are 2+ on left. 3+ right. Fine motor coordination is intact. No tremors. Motor function is  grossly 5/5 LUE and LLE.  RUE 2- elbow flex 2- finger flexors, trace tricep 0/5 wrist flex/ext, 3- R knee ext, 0/5 ankle  RLE 1 Hamstrings and HAD, otherwise 0/5 distally Musculoskeletal: minimal jt tenderness today   Vital Signs Blood pressure 120/69, pulse 92, temperature 98.3 F (36.8 C), temperature source Oral, resp. rate 16, height 5\' 5"  (1.651 m), weight 50.8 kg, last menstrual period 11/15/2008, SpO2 93 %.  Assessment/Plan: 1. Functional deficits which require 3+ hours per day of interdisciplinary therapy in a comprehensive inpatient rehab setting.  Physiatrist is providing close team supervision and 24 hour management of active medical problems listed below.  Physiatrist and rehab team continue to assess barriers to discharge/monitor patient progress toward functional and medical goals  Care Tool:  Bathing    Body parts bathed by patient: Chest,Abdomen,Right upper leg,Left upper leg,Right arm,Face,Front perineal area,Buttocks,Left arm   Body parts bathed by helper: Right lower leg,Left lower leg     Bathing assist Assist Level: Minimal Assistance - Patient > 75%     Upper Body Dressing/Undressing Upper body dressing   What is the patient wearing?: Bra,Pull over shirt    Upper body assist Assist Level: Moderate Assistance - Patient 50 - 74%    Lower Body Dressing/Undressing Lower body dressing      What is the patient wearing?: Pants     Lower body assist Assist for lower body dressing: Moderate Assistance - Patient 50 - 74%     Toileting Toileting  Toileting assist Assist for toileting: Total Assistance - Patient < 25%     Transfers Chair/bed transfer  Transfers assist     Chair/bed transfer assist level: Moderate Assistance - Patient 50 - 74%     Locomotion Ambulation   Ambulation assist      Assist level: Moderate Assistance - Patient 50 - 74% Assistive device: Other (comment) (L handrail) Max distance: 30'   Walk 10 feet  activity   Assist     Assist level: Moderate Assistance - Patient - 50 - 74% Assistive device: Other (comment) (L handrail)   Walk 50 feet activity   Assist Walk 50 feet with 2 turns activity did not occur: Safety/medical concerns         Walk 150 feet activity   Assist Walk 150 feet activity did not occur: Safety/medical concerns         Walk 10 feet on uneven surface  activity   Assist Walk 10 feet on uneven surfaces activity did not occur: Safety/medical concerns         Wheelchair     Assist Will patient use wheelchair at discharge?: Yes Type of Wheelchair: Manual Wheelchair activity did not occur: Safety/medical concerns  Wheelchair assist level: Supervision/Verbal cueing Max wheelchair distance: 100'    Wheelchair 50 feet with 2 turns activity    Assist    Wheelchair 50 feet with 2 turns activity did not occur: Safety/medical concerns   Assist Level: Supervision/Verbal cueing   Wheelchair 150 feet activity     Assist  Wheelchair 150 feet activity did not occur: Safety/medical concerns       Blood pressure 120/69, pulse 92, temperature 98.3 F (36.8 C), temperature source Oral, resp. rate 16, height 5\' 5"  (1.651 m), weight 50.8 kg, last menstrual period 11/15/2008, SpO2 93 %.  Medical Problem List and Plan: 1.  L frontal ICH secondary to recent starting of Coumadin- Coumadin on hold with R hemiplegia             -patient may shower             -ELOS/Goals: 12/31- to f/u with Neuro at [facility] post d/c to decide on restarting  anticoag  -Continue CIR therapies including PT, OT, and SLP.    2.  Portal vein thrombosis/A fib/Antithrombotics: -DVT/anticoagulation:  Pharmaceutical: SCDs due to thrombocytopenia and recent bleed.  --per neuro no coumadin-->will need to wait at least one month due to size of bleed.  --dopplers ok             -antiplatelet therapy: N/A 3. Pain Management: tylenol prn.   - kpad for neck pain  -will replace  robaxin with flexeril prn  -may have tramadol q8 prn   12/25- pain controlled- con't meds 4. Mood: LCSW to follow up for evaluation and support.              -antipsychotic agents: N/A  -add trazodone qhs prn for sleep 5. Neuropsych: This patient is capable of making decisions on her own behalf. 6. Wound ulcer/Skin/Wound Care:Pack daily wound care LLE.    -turning, nutrition, education  12/14 -added scheduled benadryl for itching. Also receiving sarna        -prn vistaril  12/23- added eucerin with cortisone in it- will apply BID- see if that helps.   12/24- added eucerin and con't eucerin with cortisone.  7. Fluids/Electrolytes/Nutrition: Strict I/O.  Labs with HD TTS reviewed, continue to monitor.  8. ESRD: Continue HD on TTS at the end  of the day to help with therapy tolerance.  9. HTN: Monitor BP tid-currently controlled on current regimen 10. Pulmonary HTN: Oxygen dependent. Continue Ambrisentan and Selexipag. On Home dose 2L by [place] 11. Cirrhosis of the liver: Monitor platelets and for any signs of bleeding. Reporting abdominal distension.  (protal vein thrombus)  -f/u abd CT as outpt 12. Chronic systolic CHF: Monitor daily weights as well as I/O. Continue tadalafil.    Filed Weights   01/04/20 0530 01/05/20 0342 01/06/20 0609  Weight: 50.6 kg 51 kg 50.8 kg    -volume mgt per nephro  12/23- down 1 kg- per pt, is at college weight right now 13. CAF: Monitor HR tid--metoprolol has been on hold. Coumadin to be held due to [place].  14. Pancytopenia:  Continue to monitor with serial checks.   Mild cont to monitor   15. Constipation WITH hemorrhoids- seems to cycle between loose stool  -prn miralax only  12/13 pt with mushy,loose stools yet seems to report constipation   -backed of scheduled senna-s for now   -added daily fiber packet  -encouraging regular solid/liquid intake as well  -stopped amitiza 12/14  -resumed senna-s at bedtime 12/15 16. Significant epistaxis (pt has prior  history)  12/16 -recurrent bleeding in right nose---stopped with rocket   -ENT Benjamine Mola) is planning on potential cauterization this weekend  12/18-Dr. Benjamine Mola cauterized hypervascular areas in nasal septi  12/20-packing out today  12/22: appreciate follow-up, healing well, f/u with Dr. Benjamine Mola outpatient. No anti coag at this time , will be at higher risk once anticoag resumed  17. Insomnia:   12/20: continue 50mg  trazodone qhs prn 18. Right neck nodule appears resolved. Likely node  19. Bowel issues  12/24- will increase senokot from 1t ab prn to BID.   LOS: 19 days A FACE TO [place] E Brack Shaddock 01/06/2020, 6:56 AM

## 2020-01-07 ENCOUNTER — Encounter (HOSPITAL_COMMUNITY): Payer: Medicare Other | Admitting: Occupational Therapy

## 2020-01-07 ENCOUNTER — Ambulatory Visit (HOSPITAL_COMMUNITY): Payer: Medicare Other

## 2020-01-07 ENCOUNTER — Inpatient Hospital Stay (HOSPITAL_COMMUNITY): Payer: Medicare Other | Admitting: Occupational Therapy

## 2020-01-07 MED ORDER — WITCH HAZEL-GLYCERIN EX PADS
MEDICATED_PAD | CUTANEOUS | Status: DC | PRN
  Filled 2020-01-07: qty 100

## 2020-01-07 MED ORDER — TRAMADOL HCL 50 MG PO TABS
50.0000 mg | ORAL_TABLET | Freq: Four times a day (QID) | ORAL | Status: DC | PRN
  Administered 2020-01-08: 50 mg via ORAL
  Filled 2020-01-07 (×2): qty 1

## 2020-01-07 NOTE — Progress Notes (Signed)
Physical Therapy Session Note  Patient Details  Name: Katie Nunez MRN: [id number] Date of Birth: 07/08/1955  Today's Date: 01/07/2020 PT Individual Time: 5176-1607 PT Individual Time Calculation (min): 41 min   Short Term Goals: Week 3:  PT Short Term Goal 1 (Week 3): =LTG due to ELOS  Skilled Therapeuegtic Interventions/Progress Updates:     Pt received seated in [facility] and agrees to therapy. No complaint of pain. Husband present for family education. WC transport to gym for time management and energy conservation. Pt ambulates x2 bouts of x20' with RW and R hand splint with PT providing modA and husband providing +2 WC follow. PT manually assists to progress R lower extremity and blocks knee during stance phase, also facilitating R lateral weight shift. For 1st bout pt ambulates with ace wrap facilitating R dorsiflexion and eversion but pt says she has difficulty moving leg with wrap, so 2nd bout performed without wrap and slightly improved mechanics.   Pt performs WC mobility with purpose of R lower extremity NMR, propelling WC forward with only R leg, engaging hamstrings as well as hip flexors and knee extensors, with PT cueing for body mechanics and sequencing. Pt then performs backward propulsion with R leg, with PT blocking R foot on ground to provide stability and keep form slipping. PT educates pt on importance of wearing shoes with improved grip and stability due to pt's typical pair frequently slipping off during session.  Pt performs stand step transfer from [facility] to recliner, performing sidesteps with PT providing modA to engage R hip abductors and R leg stability. Pt left seated in recliner with alarm intact and all needs within reach.  Therapy Documentation Precautions:  Precautions Precautions: Fall Precaution Comments: Rt hemiplegia - subluxation Restrictions Weight Bearing Restrictions: No    Therapy/Group: Individual Therapy  Breck Coons, PT, DPT 01/07/2020,  3:35 PM

## 2020-01-07 NOTE — Progress Notes (Signed)
Patient ID: ELVIN BANKER, female   DOB: 1955/03/17, 64 y.o.   MRN: [id number]  SW received phone call from pt asking if hospital bed was needed. SW informed this information will be provided once there are recommendations from team. Pt and husband would like for pt to go home. Preferred HHA remains [place]. SW waiting on updates from [facility] about HHPT/OT/aide/?RN referral for pt.   Loralee Pacas, MSW, [place] Office: [phone] Cell: [phone] Fax: [phone]

## 2020-01-07 NOTE — Progress Notes (Signed)
Occupational Therapy Session Note  Patient Details  Name: Katie Nunez MRN: [id number] Date of Birth: 1955-08-17  Today's Date: 01/07/2020  Session 1 OT Individual Time: 1000-1115 OT Individual Time Calculation (min): 75 min   Session 2 OT Individual Time: 1255-1355 OT Individual Time Calculation (min): 60 min    Short Term Goals: Week 3:  OT Short Term Goal 1 (Week 3): LTG=STG 2/2 ELOS  Skilled Therapeutic Interventions/Progress Updates:    Pt greeted in [place] on commode with nurse tech, handoff to OT. Pt was unable to have a BM, but wanted to shower. Stedy used to transfer pt into shower with min A sit<>stands. Bathing completed with hand over hand A to integrate R UE for neuro re-ed with improved flexor synergy and grasp. Pt used lateral leans to was buttocks and OT worked on sitting posture and neck position by having pt lean head back to rinse under water. Pt with more difficulty recalling hemi-dressing techniques and needed OT demonstration to dress R side first. Min A UB, mod A LB today. Sit<>stands at the sink with min A, then min/mod A for dynamic balance when pulling up pants. Pt brought to therapy gym in [place] and worked on [place] with joint input to bring elbow, shoulder, wrist, hand through full ROM. OT provided pt with yellow foam cube and worked on gripping foam. OT placed SAEBO e-stim on shoulder for subluxation. SAEBO left on for 60 minutes. OT returned to remove SAEBO with skin intact and no adverse reactions.  Saebo Stim One 330 pulse width 35 Hz pulse rate On 8 sec/ off 8 sec Ramp up/ down 2 sec Symmetrical Biphasic wave form  Max intensity 175mA at 500 Ohm load  Session 2 Pt greeted sitting in recliner finishing lunch. OT retrieved information on SAEBO e-stim and spouse entered the room. OT educated on use of Saebo and provided hand out and information for ordering. Reviewed body mechanics for stand-pivot transfer with pt and spouse. Pt's spouse assisted with  stand-pivot recliner to wc. Pt brought to therapy gym and completed squat-pivot to stronger L side with min A. OT brought pt into gravity eliminated sidelying position and used UE Ranger. Pt able to flex/ext elbow and shoulder in this position. Focus on high repetition of elbow and shoulder flex/ext. Worked on grasp and release as well as wrist flex/ext. Pt returned to room and was left seated in wc with spouse present awaiting next therapy session   Therapy Documentation Precautions:  Precautions Precautions: Fall Precaution Comments: Rt hemiplegia - subluxation Restrictions Weight Bearing Restrictions: No Pain: Pain Assessment Pain Scale: 0-10 Pain Score:1 Pain Type: Acute pain Pain Location: Buttocks Pain Descriptors / Indicators: Aching Pain Frequency: Intermittent Pain Onset: On-going Patients Stated Pain Goal: 2 Pain Intervention(s): Repositioned   Therapy/Group: Individual Therapy  Valma Cava 01/07/2020, 1:59 PM

## 2020-01-07 NOTE — Progress Notes (Signed)
[facility] PHYSICAL MEDICINE & REHABILITATION PROGRESS NOTE   Subjective/Complaints: Has sinus congestion. Just took Sudafed.  Last BM was yesterday. Feels her medications are not helping. She likes prunes and can ask her husband to bring her some. Discussed lab results with her.   ROS:   Pt denies SOB, abd pain, CP, N/V/C/D, and vision changes   Objective:   No results found. Recent Labs    01/06/20 1401  WBC 4.8  HGB 10.4*  HCT 31.2*  PLT 143*   Recent Labs    01/06/20 1401  NA 136  K 3.2*  CL 97*  CO2 24  GLUCOSE 110*  BUN 41*  CREATININE 6.12*  CALCIUM 9.5    Intake/Output Summary (Last 24 hours) at 01/07/2020 1027 Last data filed at 01/06/2020 2113 Gross per 24 hour  Intake 600 ml  Output 2500 ml  Net -1900 ml     Pressure Injury 12/18/19 Sacrum Medial Stage 1 -  Intact skin with non-blanchable redness of a localized area usually over a bony prominence. Red, non-blanchable spot over sacrum area (Active)  12/18/19 1707  Location: Sacrum  Location Orientation: Medial  Staging: Stage 1 -  Intact skin with non-blanchable redness of a localized area usually over a bony prominence.  Wound Description (Comments): Red, non-blanchable spot over sacrum area  Present on Admission: Yes     Physical Exam: Gen: no distress, normal appearing HEENT: oral mucosa pink and moist, NCAT Cardio: Reg rate Chest: normal effort, normal rate of breathing Abd: soft, non-distended Psych: pleasant and cooperative- appropriate Skin:   LUE fistula intact. Left calf wound dressed. mild sacral redness present- also has a red area on R lower leg- no increased heat, no cellulitis seen Neuro: Pt is cognitively appropriate with normal insight, memory, and awareness. intact LT in all 4's. Reflexes are 2+ on left. 3+ right. Fine motor coordination is intact. No tremors. Motor function is grossly 5/5 LUE and LLE.  RUE 2- elbow flex 2- finger flexors, trace tricep 0/5 wrist flex/ext, 3- R  knee ext, 0/5 ankle  RLE 1 Hamstrings and HAD, otherwise 0/5 distally Musculoskeletal: minimal jt tenderness today   Vital Signs Blood pressure 133/69, pulse 98, temperature 98.8 F (37.1 C), resp. rate 19, height 5\' 5"  (1.651 m), weight 51.6 kg, last menstrual period 11/15/2008, SpO2 92 %.  Assessment/Plan: 1. Functional deficits which require 3+ hours per day of interdisciplinary therapy in a comprehensive inpatient rehab setting.  Physiatrist is providing close team supervision and 24 hour management of active medical problems listed below.  Physiatrist and rehab team continue to assess barriers to discharge/monitor patient progress toward functional and medical goals  Care Tool:  Bathing    Body parts bathed by patient: Chest,Abdomen,Right upper leg,Left upper leg,Right arm,Face,Front perineal area,Buttocks,Left arm   Body parts bathed by helper: Right lower leg,Left lower leg     Bathing assist Assist Level: Minimal Assistance - Patient > 75%     Upper Body Dressing/Undressing Upper body dressing   What is the patient wearing?: Bra,Pull over shirt    Upper body assist Assist Level: Moderate Assistance - Patient 50 - 74%    Lower Body Dressing/Undressing Lower body dressing      What is the patient wearing?: Pants     Lower body assist Assist for lower body dressing: Moderate Assistance - Patient 50 - 74%     Toileting Toileting    Toileting assist Assist for toileting: Total Assistance - Patient < 25%  Transfers Chair/bed transfer  Transfers assist     Chair/bed transfer assist level: Moderate Assistance - Patient 50 - 74%     Locomotion Ambulation   Ambulation assist      Assist level: Moderate Assistance - Patient 50 - 74% Assistive device: Other (comment) (L handrail) Max distance: 30'   Walk 10 feet activity   Assist     Assist level: Moderate Assistance - Patient - 50 - 74% Assistive device: Other (comment) (L handrail)   Walk  50 feet activity   Assist Walk 50 feet with 2 turns activity did not occur: Safety/medical concerns         Walk 150 feet activity   Assist Walk 150 feet activity did not occur: Safety/medical concerns         Walk 10 feet on uneven surface  activity   Assist Walk 10 feet on uneven surfaces activity did not occur: Safety/medical concerns         Wheelchair     Assist Will patient use wheelchair at discharge?: Yes Type of Wheelchair: Manual Wheelchair activity did not occur: Safety/medical concerns  Wheelchair assist level: Supervision/Verbal cueing Max wheelchair distance: 100'    Wheelchair 50 feet with 2 turns activity    Assist    Wheelchair 50 feet with 2 turns activity did not occur: Safety/medical concerns   Assist Level: Supervision/Verbal cueing   Wheelchair 150 feet activity     Assist  Wheelchair 150 feet activity did not occur: Safety/medical concerns       Blood pressure 133/69, pulse 98, temperature 98.8 F (37.1 C), resp. rate 19, height 5\' 5"  (1.651 m), weight 51.6 kg, last menstrual period 11/15/2008, SpO2 92 %.  Medical Problem List and Plan: 1.  L frontal ICH secondary to recent starting of Coumadin- Coumadin on hold with R hemiplegia             -patient may shower             -ELOS/Goals: 12/31- to f/u with Neuro at [facility] post d/c to decide on restarting  anticoag  -Continue CIR therapies including PT, OT, and SLP.  -Continue WHO at night.     2.  Portal vein thrombosis/A fib/Antithrombotics: -DVT/anticoagulation:  Pharmaceutical: SCDs due to thrombocytopenia and recent bleed.  --per neuro no coumadin-->will need to wait at least one month due to size of bleed.  --dopplers ok             -antiplatelet therapy: N/A 3. Pain Management: tylenol prn.   - kpad for neck pain  -will replace robaxin with flexeril prn  - increase tramadol to q6H prn as per patient's request.  4. Mood: LCSW to follow up for evaluation and  support.              -antipsychotic agents: N/A  -add trazodone qhs prn for sleep 5. Neuropsych: This patient is capable of making decisions on her own behalf. 6. Wound ulcer/Skin/Wound Care:Pack daily wound care LLE.    -turning, nutrition, education  12/14 -added scheduled benadryl for itching. Also receiving sarna        -prn vistaril  12/23- added eucerin with cortisone in it- will apply BID- see if that helps.   12/24- added eucerin and con't eucerin with cortisone.  7. Fluids/Electrolytes/Nutrition: Strict I/O.  Labs with HD TTS reviewed, continue to monitor, discussed with patient. .  8. ESRD: Continue HD on TTS at the end of the day to help with therapy tolerance.  9. HTN: Monitor BP tid-currently controlled on current regimen 10. Pulmonary HTN: Oxygen dependent. Continue Ambrisentan and Selexipag. On Home dose 2L by [place] 11. Cirrhosis of the liver: Monitor platelets and for any signs of bleeding. Reporting abdominal distension.  (protal vein thrombus)  -f/u abd CT as outpt 12. Chronic systolic CHF: Monitor daily weights as well as I/O. Continue tadalafil.    Filed Weights   01/06/20 0609 01/06/20 1310 01/07/20 0433  Weight: 50.8 kg 52.5 kg 51.6 kg    -volume mgt per nephro  12/27: decreasing 12/27.  13. CAF: Monitor HR tid--metoprolol has been on hold. Coumadin to be held due to [place].  14. Pancytopenia:  Continue to monitor with serial checks.   Mild cont to monitor   15. Constipation WITH hemorrhoids- seems to cycle between loose stool  -prn miralax only  12/13 pt with mushy,loose stools yet seems to report constipation   -backed of scheduled senna-s for now   -added daily fiber packet  -encouraging regular solid/liquid intake as well  -stopped amitiza 12/14  -resumed senna-s at bedtime 12/15 16. Significant epistaxis (pt has prior history)  12/16 -recurrent bleeding in right nose---stopped with rocket   -ENT Benjamine Mola) is planning on potential cauterization this  weekend  12/18-Dr. Benjamine Mola cauterized hypervascular areas in nasal septi  12/20-packing out today  12/22: appreciate follow-up, healing well, f/u with Dr. Benjamine Mola outpatient. No anti coag at this time , will be at higher risk once anticoag resumed  17. Insomnia:   12/20: continue 50mg  trazodone qhs prn 18. Right neck nodule appears resolved. Likely node  19. Bowel issues  12/24- will increase senokot from 1t ab prn to BID.  20. Sinus congestion: Continue Sudafed  LOS: 20 days A FACE TO FACE EVALUATION WAS PERFORMED  Katie Nunez 01/07/2020, 8:32 AM

## 2020-01-07 NOTE — Progress Notes (Signed)
Patient ID: Katie Nunez, female   DOB: 1955/12/31, 64 y.o.   MRN: [id number]  [facility] Progress Note   Assessment/ Plan:   1.  Intracerebral hemorrhage: Suspected to be related to microvascular disease and recently started Coumadin therapy.  She remains off warfarin at this time with repeat CT scan of the head showing no extension of hematoma.  Because she suffered significant neurological deficits of the right upper and lower extremities, she was admitted to CIR for intensive PT/OT. 2. ESRD: usual HD is TTS. Changed to regular diet with continued fluid restriction to help improve selection variety.  Phosphorus/potassium level will be followed closely. 3. Anemia: Hemoglobin and hematocrit within acceptable range-continue low-dose ESA.  No overt loss. 4. CKD-MBD: Continue Sensipar and Hectorol for PTH control, on calcium acetate for phosphorus binding with current calcium and phosphorus level at goal. 5. Nutrition: Continue current renal diet with protein supplementation with close reevaluation of estimated dry weight. 6. Hypertension: Blood pressure within acceptable range, she appears to be close to euvolemic on physical exam.  Kelly Splinter, MD 01/07/2020, 3:57 PM   Subjective:   No acute events overnight and requests regular diet instead of renal diet.   Objective:   BP 126/69 (BP Location: Right Arm)   Pulse 99   Temp 98.1 F (36.7 C)   Resp 18   Ht 5\' 5"  (1.651 m)   Wt 51.6 kg   LMP 11/15/2008   SpO2 92%   BMI 18.93 kg/m   Physical Exam: Gen: Sitting up in bed eating breakfast CVS: Pulse regular rhythm, normal rate, S1 and S2 normal without murmur/rubs Resp: Clear to auscultation bilaterally without distinct rales or rhonchi Abd: Soft, obese, nontender, bowel sounds normal Ext: No lower extremity edema.  Left upper arm brachiocephalic fistula with intact dressings.  HD orders: SW TTS 3.5h 450/600 2/2.25 bath 58.5kg P2 LUA AVF Hep none -Mircera  50 mcg IV q 4 weeks -Hectorol 6 mcg IV TIW -Sensipar 30 mg PO TIW  Labs: BMET Recent Labs  Lab 01/01/20 1236 01/03/20 1434 01/06/20 1401  NA 135 134* 136  K 3.8 4.4 3.2*  CL 95* 95* 97*  CO2 24 24 24   GLUCOSE 87 101* 110*  BUN 54* 51* 41*  CREATININE 8.44* 7.32* 6.12*  CALCIUM 9.8 10.4* 9.5  PHOS 5.1* 5.2* 3.8   CBC Recent Labs  Lab 01/01/20 1236 01/03/20 1433 01/06/20 1401  WBC 3.4* 4.8 4.8  HGB 9.6* 10.2* 10.4*  HCT 29.7* 32.2* 31.2*  MCV 90.5 91.0 91.2  PLT 163 148* 143*     Medications:    . ambrisentan  5 mg Oral Daily  . calcium acetate  1,334 mg Oral TID WC  . camphor-menthol   Topical BID  . Chlorhexidine Gluconate Cloth  6 each Topical BID  . cinacalcet  30 mg Oral Q T,Th,Sa-HD  . darbepoetin (ARANESP) injection - DIALYSIS  40 mcg Intravenous Q Thu-HD  . doxercalciferol  6 mcg Intravenous Q T,Th,Sa-HD  . feeding supplement (NEPRO CARB STEADY)  237 mL Oral BID BM  . Gerhardt's butt cream   Topical QID  . hydrocerin   Topical BID  . hydrocerin   Topical BID  . hydrocortisone  25 mg Rectal BID  . loratadine  10 mg Oral Daily  . pentoxifylline  400 mg Oral Daily  . psyllium  1 packet Oral BID  . Selexipag  800 mcg Oral BID  . senna-docusate  1 tablet Oral BID  . sodium  chloride  1 spray Each Nare 5 X Daily  . tadalafil  20 mg Oral Daily  . triamcinolone 0.1 % cream : eucerin   Topical BID

## 2020-01-08 ENCOUNTER — Inpatient Hospital Stay (HOSPITAL_COMMUNITY): Payer: Medicare Other

## 2020-01-08 ENCOUNTER — Inpatient Hospital Stay (HOSPITAL_COMMUNITY): Payer: Medicare Other | Admitting: Occupational Therapy

## 2020-01-08 LAB — CBC
HCT: 34.3 % — ABNORMAL LOW (ref 36.0–46.0)
Hemoglobin: 11 g/dL — ABNORMAL LOW (ref 12.0–15.0)
MCH: 29.4 pg (ref 26.0–34.0)
MCHC: 32.1 g/dL (ref 30.0–36.0)
MCV: 91.7 fL (ref 80.0–100.0)
Platelets: 162 10*3/uL (ref 150–400)
RBC: 3.74 MIL/uL — ABNORMAL LOW (ref 3.87–5.11)
RDW: 16.1 % — ABNORMAL HIGH (ref 11.5–15.5)
WBC: 6.6 10*3/uL (ref 4.0–10.5)
nRBC: 0 % (ref 0.0–0.2)

## 2020-01-08 LAB — RENAL FUNCTION PANEL
Albumin: 3.2 g/dL — ABNORMAL LOW (ref 3.5–5.0)
Anion gap: 17 — ABNORMAL HIGH (ref 5–15)
BUN: 62 mg/dL — ABNORMAL HIGH (ref 8–23)
CO2: 24 mmol/L (ref 22–32)
Calcium: 10.7 mg/dL — ABNORMAL HIGH (ref 8.9–10.3)
Chloride: 93 mmol/L — ABNORMAL LOW (ref 98–111)
Creatinine, Ser: 8.17 mg/dL — ABNORMAL HIGH (ref 0.44–1.00)
GFR, Estimated: 5 mL/min — ABNORMAL LOW (ref >60)
Glucose, Bld: 90 mg/dL (ref 70–99)
Phosphorus: 5.4 mg/dL — ABNORMAL HIGH (ref 2.5–4.6)
Potassium: 3.9 mmol/L (ref 3.5–5.1)
Sodium: 134 mmol/L — ABNORMAL LOW (ref 135–145)

## 2020-01-08 MED ORDER — MENTHOL 3 MG MT LOZG
1.0000 | LOZENGE | OROMUCOSAL | Status: DC | PRN
  Administered 2020-01-09 – 2020-01-16 (×2): 3 mg via ORAL
  Filled 2020-01-08 (×3): qty 9

## 2020-01-08 MED ORDER — PHENOL 1.4 % MT LIQD
1.0000 | OROMUCOSAL | Status: DC | PRN
  Filled 2020-01-08: qty 177

## 2020-01-08 MED ORDER — DOXERCALCIFEROL 4 MCG/2ML IV SOLN
INTRAVENOUS | Status: AC
  Administered 2020-01-08: 16:00:00 6 ug via INTRAVENOUS
  Filled 2020-01-08: qty 4

## 2020-01-08 NOTE — Progress Notes (Signed)
Occupational Therapy Session Note  Patient Details  Name: Katie Nunez MRN: [id number] Date of Birth: 07-04-1955  Today's Date: 01/08/2020 OT Individual Time: 0900-1014 OT Individual Time Calculation (min): 74 min    Short Term Goals: Week 2:  OT Short Term Goal 1 (Week 2): Pt will don pants over feet with mod A. OT Short Term Goal 1 - Progress (Week 2): Met OT Short Term Goal 2 (Week 2): Pt will stand at toilet with mod A of 1. OT Short Term Goal 2 - Progress (Week 2): Met OT Short Term Goal 3 (Week 2): Patient will reposition R UE prior to transfer with min questioning cues OT Short Term Goal 3 - Progress (Week 2): Met OT Short Term Goal 4 (Week 2): Pt will complete self ROM with min cues OT Short Term Goal 4 - Progress (Week 2): Met Week 3:  OT Short Term Goal 1 (Week 3): LTG=STG 2/2 ELOS  Skilled Therapeutic Interventions/Progress Updates:    Patient seated in w/c, alert and ready for therapy session.  She denies pain and requests session focused on right arm.  Sit pivot transfer to/from w/c and mat table with CGA/min A with cues for body position.  Unsupported sitting with DS.  NMRE with focus on posture, trunk control, scapular stability, PROM/stretch, motor control of right UE in supine, sitting and standing positions, inhibition of flexors, facilitation of extensors, functional reach, weight bearing, she is able to achieve shoulder flexion, elbow extension, supination and wrist/digit extension - does better with resistance.  Sit to stand at mat table with CGA - she is able to maintain stance with min A during NMRE activities.  She remained seated in w/c at close of session, seat belt alarm set and call bell in reach.    Therapy Documentation Precautions:  Precautions Precautions: Fall Precaution Comments: Rt hemiplegia - subluxation Restrictions Weight Bearing Restrictions: No   Therapy/Group: Individual Therapy  Carlos Levering 01/08/2020, 7:38 AM

## 2020-01-08 NOTE — Progress Notes (Signed)
Patient ID: Katie Nunez, female   DOB: 1955-03-03, 64 y.o.   MRN: [id number] Team Conference Report to Patient/Family  Team Conference discussion was reviewed with the patient and caregiver, including goals, any changes in plan of care and target discharge date.  Patient and caregiver express understanding and are in agreement.  The patient has a target discharge date of 01/18/20.  Dyanne Iha 01/08/2020, 1:21 PM

## 2020-01-08 NOTE — Progress Notes (Signed)
Alerted floor RN Santiago Glad that the [place] is to be given after dialysis with food not prior to dialysis on an empty stomach as done today. The patient stomach became upset resulting in entire bed linen to be changed while in dialysis.

## 2020-01-08 NOTE — Progress Notes (Signed)
Physical Therapy Session Note  Patient Details  Name: Katie Nunez MRN: [id number] Date of Birth: September 30, 1955  Today's Date: 01/08/2020 PT Individual Time: 1100-1159 PT Individual Time Calculation (min): 59 min   Short Term Goals: Week 3:  PT Short Term Goal 1 (Week 3): =LTG due to ELOS  Skilled Therapeutic Interventions/Progress Updates:     Pt received seated in [facility] and agrees to therapy. No complaint of pain. WC transport to dayroom for time management and energy conservation. Session focused on gait training in [place] on treadmill for body weight supported ambulation. Sit to stand with minA/modA and cues for hand placement and body mechanics. Pt able to stand with supervision, holding onto litegait while PT dons harness. MinA/modA for step up onto treadmill. Pt completes following bouts on treadmill with light bodyweight support: :50 at 0.79mph for 35' 1:56 at 0.5 mph for 13' 1:29 at 0.5-0.59mph for 29' 1:31 at 0.4 mph for 59' PT initially provides assist at R leg for progression during swing phase and blocking knee during stance phase. Pt has difficulty shifting weight to R and has very short step length on L as a result. For several bouts PT has +2 assist to facilitate lateral weight shift at pt's hips with improved reciprocal gait pattern. Pt also uses mirror for visual feedback and attains optimal posture in standing with bodyweight support. Progression to lifting L leg and performing single leg stance on R. Pt then perform mini knee bends with R knee, attempting to provide eccentric control, isometric hold, then concentric knee extension, with PT providing minA.   WC transport back to room. Pt left seated in WC with lap tray in place, alarm intact, and all needs within reach.  Therapy Documentation Precautions:  Precautions Precautions: Fall Precaution Comments: Rt hemiplegia - subluxation Restrictions Weight Bearing Restrictions: No   Therapy/Group: Individual  Therapy  Breck Coons, PT, DPT 01/08/2020, 12:55 PM

## 2020-01-08 NOTE — Progress Notes (Signed)
Occupational Therapy Session Note  Patient Details  Name: Katie Nunez MRN: [id number] Date of Birth: July 09, 1955  Today's Date: 01/08/2020 OT Individual Time: 6734-1937 OT Individual Time Calculation (min): 60 min   Short Term Goals: Week 3:  OT Short Term Goal 1 (Week 3): LTG=STG 2/2 ELOS  Skilled Therapeutic Interventions/Progress Updates:    Pt greeted semi-reclined in bed and agreeable to OT treatment session. Pt declined bathing today but wanted to get dressed. Pt with better recall of hemi dressing techniques today, requiring only min cues on how to thread R UE. OT encouraged pt to activate R UE to lift into shirt which pt was able to do. She had difficulty with dual task of lifing R UE while also pulling fabric over R arm. LB dressing with pt able to achieve figure 4 position with both R LE and LLE to thread pants, Sit<>stand with min A< then mod A for balance while OT assisted with pulling up pants. Pt able to don socks in figure 4 position using one handed technique, then min A to don shoes. Min A squat-pivot to wc on stronger L side. Grooming tasks completed from wc at the sink with education provided for use of R hand as a stabilizer. OT placed SAEBO e-stim on wrist extensors. SAEBO left on for 60 minutes. OT returned to remove SAEBO with skin intact and no adverse reactions.  [place] Stim One 330 pulse width 35 Hz pulse rate On 8 sec/ off 8 sec Ramp up/ down 2 sec Symmetrical Biphasic wave form  Max intensity 150m at 500 Ohm load  Pt left seated in wc with alarm belt on, call bell in reach, and needs met.   Therapy Documentation Precautions:  Precautions Precautions: Fall Precaution Comments: Rt hemiplegia - subluxation Restrictions Weight Bearing Restrictions: No Pain: Denies pain  Therapy/Group: Individual Therapy  EValma Cava12/28/2021, 7:53 AM

## 2020-01-08 NOTE — Progress Notes (Signed)
[place] PHYSICAL MEDICINE & REHABILITATION PROGRESS NOTE   Subjective/Complaints: She is having two BM per day but has incomplete evacuation. Advised that her husband give her 6 prunes per day.  Discussed HD labs with her. She has sore throat-ordered lozenges and throat spray for her.   ROS:   Pt denies SOB, abd pain, CP, N/V/C/D, and vision changes, +spre throat.    Objective:   No results found. Recent Labs    01/06/20 1401  WBC 4.8  HGB 10.4*  HCT 31.2*  PLT 143*   Recent Labs    01/06/20 1401  NA 136  K 3.2*  CL 97*  CO2 24  GLUCOSE 110*  BUN 41*  CREATININE 6.12*  CALCIUM 9.5    Intake/Output Summary (Last 24 hours) at 01/08/2020 9417 Last data filed at 01/08/2020 0700 Gross per 24 hour  Intake 540 ml  Output --  Net 540 ml     Pressure Injury 12/18/19 Sacrum Medial Stage 1 -  Intact skin with non-blanchable redness of a localized area usually over a bony prominence. Red, non-blanchable spot over sacrum area (Active)  12/18/19 1707  Location: Sacrum  Location Orientation: Medial  Staging: Stage 1 -  Intact skin with non-blanchable redness of a localized area usually over a bony prominence.  Wound Description (Comments): Red, non-blanchable spot over sacrum area  Present on Admission: Yes     Physical Exam: Gen: no distress, normal appearing HEENT: oral mucosa pink and moist, NCAT Cardio: Reg rate Chest: normal effort, normal rate of breathing Abd: soft, non-distended Ext: no edema Skin: intact Psych: pleasant and cooperative- appropriate Skin:   LUE fistula intact. Left calf wound dressed. mild sacral redness present- also has a red area on R lower leg- no increased heat, no cellulitis seen Neuro: Pt is cognitively appropriate with normal insight, memory, and awareness. intact LT in all 4's. Reflexes are 2+ on left. 3+ right. Fine motor coordination is intact. No tremors. Motor function is grossly 5/5 LUE and LLE.  RUE 2- elbow flex 2- finger  flexors, trace tricep 0/5 wrist flex/ext, 3- R knee ext, 0/5 ankle  RLE 1 Hamstrings and HAD, otherwise 0/5 distally Musculoskeletal: minimal jt tenderness today   Vital Signs Blood pressure 138/64, pulse 91, temperature 97.9 F (36.6 C), resp. rate 17, height 5\' 5"  (1.651 m), weight 52.3 kg, last menstrual period 11/15/2008, SpO2 94 %.  Assessment/Plan: 1. Functional deficits which require 3+ hours per day of interdisciplinary therapy in a comprehensive inpatient rehab setting.  Physiatrist is providing close team supervision and 24 hour management of active medical problems listed below.  Physiatrist and rehab team continue to assess barriers to discharge/monitor patient progress toward functional and medical goals  Care Tool:  Bathing    Body parts bathed by patient: Chest,Abdomen,Right upper leg,Left upper leg,Right arm,Face,Front perineal area,Buttocks,Left arm,Left lower leg,Right lower leg   Body parts bathed by helper: Right lower leg,Left lower leg     Bathing assist Assist Level: Minimal Assistance - Patient > 75%     Upper Body Dressing/Undressing Upper body dressing   What is the patient wearing?: Pull over shirt    Upper body assist Assist Level: Minimal Assistance - Patient > 75%    Lower Body Dressing/Undressing Lower body dressing      What is the patient wearing?: Pants     Lower body assist Assist for lower body dressing: Moderate Assistance - Patient 50 - 74%     Toileting Toileting  Toileting assist Assist for toileting: Moderate Assistance - Patient 50 - 74%     Transfers Chair/bed transfer  Transfers assist     Chair/bed transfer assist level: Minimal Assistance - Patient > 75%     Locomotion Ambulation   Ambulation assist      Assist level: 2 helpers (WC follow) Assistive device: Walker-rolling Max distance: 20'   Walk 10 feet activity   Assist     Assist level: 2 helpers (WC follow) Assistive device: Walker-rolling    Walk 50 feet activity   Assist Walk 50 feet with 2 turns activity did not occur: Safety/medical concerns         Walk 150 feet activity   Assist Walk 150 feet activity did not occur: Safety/medical concerns         Walk 10 feet on uneven surface  activity   Assist Walk 10 feet on uneven surfaces activity did not occur: Safety/medical concerns         Wheelchair     Assist Will patient use wheelchair at discharge?: Yes Type of Wheelchair: Manual Wheelchair activity did not occur: Safety/medical concerns  Wheelchair assist level: Supervision/Verbal cueing Max wheelchair distance: 100'    Wheelchair 50 feet with 2 turns activity    Assist    Wheelchair 50 feet with 2 turns activity did not occur: Safety/medical concerns   Assist Level: Supervision/Verbal cueing   Wheelchair 150 feet activity     Assist  Wheelchair 150 feet activity did not occur: Safety/medical concerns       Blood pressure 138/64, pulse 91, temperature 97.9 F (36.6 C), resp. rate 17, height 5\' 5"  (1.651 m), weight 52.3 kg, last menstrual period 11/15/2008, SpO2 94 %.  Medical Problem List and Plan: 1.  L frontal ICH secondary to recent starting of Coumadin- Coumadin on hold with R hemiplegia             -patient may shower             -ELOS/Goals: 12/31- to f/u with Neuro at [facility] post d/c to decide on restarting  anticoag  -Continue CIR therapies including PT, OT, and SLP.  -Continue WHO at night.      --Interdisciplinary Team Conference today  2.  Portal vein thrombosis/A fib/Antithrombotics: -DVT/anticoagulation:  Pharmaceutical: SCDs due to thrombocytopenia and recent bleed.  --per neuro no coumadin-->will need to wait at least one month due to size of bleed.  --dopplers ok             -antiplatelet therapy: N/A 3. Pain Management: tylenol prn.   - kpad for neck pain  -will replace robaxin with flexeril prn  - increase tramadol to q6H prn as per patient's request.   4. Mood: LCSW to follow up for evaluation and support.              -antipsychotic agents: N/A  -add trazodone qhs prn for sleep 5. Neuropsych: This patient is capable of making decisions on her own behalf. 6. Wound ulcer/Skin/Wound Care:Pack daily wound care LLE.    -turning, nutrition, education  12/14 -added scheduled benadryl for itching. Also receiving sarna        -prn vistaril  12/23- added eucerin with cortisone in it- will apply BID- see if that helps.   12/24- added eucerin and con't eucerin with cortisone.  7. Fluids/Electrolytes/Nutrition: Strict I/O.  Labs with HD TTS reviewed, continue to monitor, discussed with patient. .  8. ESRD: Continue HD on TTS at the end of  the day to help with therapy tolerance.  9. HTN: Monitor BP tid-currently controlled on current regimen 10. Pulmonary HTN: Oxygen dependent. Continue Ambrisentan and Selexipag. On Home dose 2L by [place] 11. Cirrhosis of the liver: Monitor platelets and for any signs of bleeding. Reporting abdominal distension.  (protal vein thrombus)  -f/u abd CT as outpt 12. Chronic systolic CHF: Monitor daily weights as well as I/O. Continue tadalafil.    Filed Weights   01/06/20 1310 01/07/20 0433 01/08/20 0431  Weight: 52.5 kg 51.6 kg 52.3 kg    -volume mgt per nephro  12/28: weight slightly up- continue to monitor.  13. CAF: Monitor HR tid--metoprolol has been on hold. Coumadin to be held due to [place].  14. Pancytopenia:  Continue to monitor with serial checks.   Mild cont to monitor   15. Constipation WITH hemorrhoids- seems to cycle between loose stool  -prn miralax only  12/13 pt with mushy,loose stools yet seems to report constipation   -backed of scheduled senna-s for now   -added daily fiber packet  -encouraging regular solid/liquid intake as well  -stopped amitiza 12/14  -resumed senna-s at bedtime 12/15 16. Significant epistaxis (pt has prior history)  12/16 -recurrent bleeding in right nose---stopped with  rocket   -ENT Benjamine Mola) is planning on potential cauterization this weekend  12/18-Dr. Benjamine Mola cauterized hypervascular areas in nasal septi  12/20-packing out today  12/22: appreciate follow-up, healing well, f/u with Dr. Benjamine Mola outpatient. No anti coag at this time , will be at higher risk once anticoag resumed  17. Insomnia:   12/20: continue 50mg  trazodone qhs prn 18. Right neck nodule appears resolved. Likely node  19. Bowel issues  12/24- will increase senokot from 1t ab prn to BID.   12/28: advised that husband bring in prunes which she loves- 6 per day 20. Sinus congestion: Continue Sudafed 21. Sore throat: add lozenges and throat spray prn.   LOS: 21 days A FACE TO FACE EVALUATION WAS PERFORMED  Clide Deutscher Amory Simonetti 01/08/2020, 8:42 AM

## 2020-01-08 NOTE — Patient Care Conference (Signed)
Inpatient RehabilitationTeam Conference and Plan of Care Update Date: 01/08/2020   Time: 10:30 AM    Patient Name: Katie Nunez      Medical Record Number: [id number]  Date of Birth: 05-29-1955 Sex: Female         Room/Bed: 4M03C/4M03C-01 Payor Info: Payor: [employer] MEDICARE / Plan: [facility] MEDICARE / Product Type: *No Product type* /    Admit Date/Time:  12/18/2019  4:05 PM  Primary Diagnosis:  Intraparenchymal hemorrhage of brain [facility])  Hospital Problems: Principal Problem:   Intraparenchymal hemorrhage of brain [facility]) Active Problems:   Pressure injury of skin    Expected Discharge Date: Expected Discharge Date: 01/18/20  Team Members Present: Physician leading conference: Dr. Leeroy Cha Care Coodinator Present: Erlene Quan, BSW;Tayte Childers Creig Hines, RN, BSN, [place] Nurse Present: Isla Pence, RN PT Present: Tereasa Coop, PT OT Present: Cherylynn Ridges, OT PPS Coordinator present : Ileana Ladd, Burna Mortimer, SLP     Current Status/Progress Goal Weekly Team Focus  Bowel/Bladder   pt is conintent of bowel and bladder LBM 12/28 patient is refusing some bowel medications like the annusol suppositories for her hemmrhoids because they cause her to feel the urge to poop.  remain continent of bowel  [place] scheduled medications assess toileting needs q2 hours and as needed   Swallow/Nutrition/ Hydration             ADL's   Min/mod A transfers, min/mod A UB and LB ADLs  min/CGA  self-care retraining, R NMR, NMES, transfers, pt/family education   Mobility   minA bed mobility, minA/modA stand pivot transfers, modA gait x30' with hand rail. improving NM return in R hemibody.  supervision bed level, minA transfers and ambulation household distances  R hemibody NMR, balance, ambulation   Communication             Safety/Cognition/ Behavioral Observations            Pain   Pt c/o pain of 6/10  saying legs are hurting medications for nerve pain bring pain to  tolerable levels  decrease pain  Assess pain q4h and as needed   Skin   skin dry and itchy  remain free of skin breakdown and infection  Assess skin qshift and as needed     Discharge Planning:  D/c to home with support from husband and PRN support from son. Waiting on updates from [place] on if referral can be accepted. Family education completed on 12/22 and 12/27 with pt husband.   Team Discussion: Continent B/B, Sudafed given for congestion. Family education given. OT reports patient is a mod assist at times with min assist to contact guard goals. She is getting some return on that right side. PT reports patient is having some return to the RLE and would like to extend her for another week. The team agrees since she is having this return of function. She has been discharged from SLP.  Patient on target to meet rehab goals: yes, and she has been extended a week because she is having return to her right side.  *See Care Plan and progress notes for long and short-term goals.   Revisions to Treatment Plan:  Extended an additional week Right extremities returning of function  Teaching Needs: Continue family education Medication management  Current Barriers to Discharge: Decreased caregiver support, Home enviroment access/layout, Lack of/limited family support and Behavior  Possible Resolutions to Barriers: Continue current medications, provide emotional support to patient and family.  Medical Summary               I attest that I was present, lead the team conference, and concur with the assessment and plan of the team.   Cristi Loron 01/08/2020, 2:36 PM

## 2020-01-09 ENCOUNTER — Inpatient Hospital Stay (HOSPITAL_COMMUNITY): Payer: Medicare Other

## 2020-01-09 MED ORDER — TRAMADOL HCL 50 MG PO TABS
50.0000 mg | ORAL_TABLET | Freq: Two times a day (BID) | ORAL | Status: DC | PRN
  Administered 2020-01-10: 50 mg via ORAL
  Filled 2020-01-09 (×2): qty 1

## 2020-01-09 MED ORDER — CINACALCET HCL 30 MG PO TABS
30.0000 mg | ORAL_TABLET | ORAL | Status: DC
  Administered 2020-01-10: 30 mg via ORAL
  Filled 2020-01-09 (×2): qty 1

## 2020-01-09 NOTE — Progress Notes (Signed)
[facility] Progress Note   Subjective:  Seen in room - feeling well overall. + diarrhea during HD yesterday after getting sensipar on empty stomach (known complication) - added note to med directions. Tells me her d/c date was backed out to 1/7 which she is happy about, says PT/OT going well.  Objective Vitals:   01/08/20 1818 01/08/20 1938 01/09/20 0600 01/09/20 0610  BP: 114/61 126/64  117/69  Pulse: 96 98  87  Resp: 17 16  16   Temp: 98.5 F (36.9 C) 99 F (37.2 C)  98.3 F (36.8 C)  TempSrc:    Oral  SpO2: 96% 97%  90%  Weight:   49.7 kg   Height:       Physical Exam General: Frail woman, NAD. Nasal O2 in place Heart: RRR; no murmur Lungs: CTAB Abdomen: soft Extremities: No LE edema Dialysis Access: LUE AVF + thrill  Additional Objective Labs: Basic Metabolic Panel: Recent Labs  Lab 01/03/20 1434 01/06/20 1401 01/08/20 1433  NA 134* 136 134*  K 4.4 3.2* 3.9  CL 95* 97* 93*  CO2 24 24 24   GLUCOSE 101* 110* 90  BUN 51* 41* 62*  CREATININE 7.32* 6.12* 8.17*  CALCIUM 10.4* 9.5 10.7*  PHOS 5.2* 3.8 5.4*   Liver Function Tests: Recent Labs  Lab 01/03/20 1434 01/06/20 1401 01/08/20 1433  ALBUMIN 3.1* 3.2* 3.2*   CBC: Recent Labs  Lab 01/03/20 1433 01/06/20 1401 01/08/20 1432  WBC 4.8 4.8 6.6  HGB 10.2* 10.4* 11.0*  HCT 32.2* 31.2* 34.3*  MCV 91.0 91.2 91.7  PLT 148* 143* 162   Medications:  . ambrisentan  5 mg Oral Daily  . calcium acetate  1,334 mg Oral TID WC  . camphor-menthol   Topical BID  . Chlorhexidine Gluconate Cloth  6 each Topical BID  . cinacalcet  30 mg Oral Q T,Th,Sa-HD  . darbepoetin (ARANESP) injection - DIALYSIS  40 mcg Intravenous Q Thu-HD  . doxercalciferol  6 mcg Intravenous Q T,Th,Sa-HD  . feeding supplement (NEPRO CARB STEADY)  237 mL Oral BID BM  . Gerhardt's butt cream   Topical QID  . hydrocerin   Topical BID  . hydrocortisone  25 mg Rectal BID  . loratadine  10 mg Oral Daily  . pentoxifylline  400  mg Oral Daily  . psyllium  1 packet Oral BID  . Selexipag  800 mcg Oral BID  . senna-docusate  1 tablet Oral BID  . sodium chloride  1 spray Each Nare 5 X Daily  . tadalafil  20 mg Oral Daily  . triamcinolone 0.1 % cream : eucerin   Topical BID    Dialysis Orders: SW TTS 3.5h 450/600 2/2.25 bath 58.5kg P2 LUA AVF Hep none - Mircera 50 mcg IV q 4 weeks - Hectorol 6 mcg IV TIW - Sensipar 30 mg PO TIW  Assessment/Plan: 1.  Intracerebral hemorrhage: Suspected to be related to microvascular disease and warfarin - now stopped. Repeat CT scan of the head showing no extension of hematoma.  Because she suffered significant neurological deficits of the right upper and lower extremities, she was admitted to CIR for intensive PT/OT. 2. ESRD: Continue HD per TTS sched - next tomorrow. No heparin. 3. Anemia: Hgb 11 - getting low dose Aranesp q Thurs - will hold if Hgb rises further. 4. CKD-MBD: Ca high, Phos ok. Will hold VDRA and check PTH, remains on sensipar. On Phoslo as binder - tells me tried other binders and had issues  swallowing them and not interested in chewable binder. Will use low Ca bath with HD as well. 5. Nutrition: Alb low, continue Nepro supplements. 6. Hypertension: BP stable without edema, she has lost quite a bit of body weight during prolonged hospitalization, will lower on discharge.   Veneta Penton, PA-C 01/09/2020, 10:26 AM  [employer]

## 2020-01-09 NOTE — Progress Notes (Signed)
[place] PHYSICAL MEDICINE & REHABILITATION PROGRESS NOTE   Subjective/Complaints: C/o brown discharge from nose, as well as some sputum production. Denies SOB Throat pain is better but notices some swollen lymph nodes on the right side of her neck. She has been drinking tea with honey- has not asked for lozenges.  Hgb trended upward yesterday, as did Cr and K+  ROS:   Pt denies SOB, abd pain, CP, N/V/C/D, and vision changes, sore throat is improved    Objective:   No results found. Recent Labs    01/06/20 1401 01/08/20 1432  WBC 4.8 6.6  HGB 10.4* 11.0*  HCT 31.2* 34.3*  PLT 143* 162   Recent Labs    01/06/20 1401 01/08/20 1433  NA 136 134*  K 3.2* 3.9  CL 97* 93*  CO2 24 24  GLUCOSE 110* 90  BUN 41* 62*  CREATININE 6.12* 8.17*  CALCIUM 9.5 10.7*    Intake/Output Summary (Last 24 hours) at 01/09/2020 1225 Last data filed at 01/09/2020 1884 Gross per 24 hour  Intake 240 ml  Output 1625 ml  Net -1385 ml     Pressure Injury 12/18/19 Sacrum Medial Stage 1 -  Intact skin with non-blanchable redness of a localized area usually over a bony prominence. Red, non-blanchable spot over sacrum area (Active)  12/18/19 1707  Location: Sacrum  Location Orientation: Medial  Staging: Stage 1 -  Intact skin with non-blanchable redness of a localized area usually over a bony prominence.  Wound Description (Comments): Red, non-blanchable spot over sacrum area  Present on Admission: Yes     Physical Exam: Gen: no distress, normal appearing HEENT: oral mucosa pink and moist, NCAT Cardio: Reg rate Chest: normal effort, normal rate of breathing Abd: soft, non-distended Ext: no edema Psych: pleasant and cooperative- appropriate Skin:   LUE fistula intact. Left calf wound dressed. mild sacral redness present- also has a red area on R lower leg- no increased heat, no cellulitis seen Neuro: Pt is cognitively appropriate with normal insight, memory, and awareness. intact LT in  all 4's. Reflexes are 2+ on left. 3+ right. Fine motor coordination is intact. No tremors. Motor function is grossly 5/5 LUE and LLE.  RUE 2- elbow flex 2- finger flexors, trace tricep 0/5 wrist flex/ext, 3- R knee ext, 0/5 ankle  RLE 1 Hamstrings and HAD, otherwise 0/5 distally Musculoskeletal: minimal jt tenderness today   Vital Signs Blood pressure 117/69, pulse 87, temperature 98.3 F (36.8 C), temperature source Oral, resp. rate 16, height 5\' 5"  (1.651 m), weight 49.7 kg, last menstrual period 11/15/2008, SpO2 90 %.  Assessment/Plan: 1. Functional deficits which require 3+ hours per day of interdisciplinary therapy in a comprehensive inpatient rehab setting.  Physiatrist is providing close team supervision and 24 hour management of active medical problems listed below.  Physiatrist and rehab team continue to assess barriers to discharge/monitor patient progress toward functional and medical goals  Care Tool:  Bathing    Body parts bathed by patient: Chest,Abdomen,Right upper leg,Left upper leg,Right arm,Face,Front perineal area,Buttocks,Left arm,Left lower leg,Right lower leg   Body parts bathed by helper: Right lower leg,Left lower leg     Bathing assist Assist Level: Minimal Assistance - Patient > 75%     Upper Body Dressing/Undressing Upper body dressing   What is the patient wearing?: Pull over shirt    Upper body assist Assist Level: Minimal Assistance - Patient > 75%    Lower Body Dressing/Undressing Lower body dressing  What is the patient wearing?: Pants     Lower body assist Assist for lower body dressing: Moderate Assistance - Patient 50 - 74%     Toileting Toileting    Toileting assist Assist for toileting: Moderate Assistance - Patient 50 - 74%     Transfers Chair/bed transfer  Transfers assist     Chair/bed transfer assist level: Minimal Assistance - Patient > 75%     Locomotion Ambulation   Ambulation assist      Assist level:  Total Assistance - Patient < 25% Assistive device: Lite Gait Max distance: 40'   Walk 10 feet activity   Assist     Assist level: Total Assistance - Patient < 25% Assistive device: Lite Gait   Walk 50 feet activity   Assist Walk 50 feet with 2 turns activity did not occur: Safety/medical concerns         Walk 150 feet activity   Assist Walk 150 feet activity did not occur: Safety/medical concerns         Walk 10 feet on uneven surface  activity   Assist Walk 10 feet on uneven surfaces activity did not occur: Safety/medical concerns         Wheelchair     Assist Will patient use wheelchair at discharge?: Yes Type of Wheelchair: Manual Wheelchair activity did not occur: Safety/medical concerns  Wheelchair assist level: Supervision/Verbal cueing Max wheelchair distance: 100'    Wheelchair 50 feet with 2 turns activity    Assist    Wheelchair 50 feet with 2 turns activity did not occur: Safety/medical concerns   Assist Level: Supervision/Verbal cueing   Wheelchair 150 feet activity     Assist  Wheelchair 150 feet activity did not occur: Safety/medical concerns       Blood pressure 117/69, pulse 87, temperature 98.3 F (36.8 C), temperature source Oral, resp. rate 16, height 5\' 5"  (1.651 m), weight 49.7 kg, last menstrual period 11/15/2008, SpO2 90 %.  Medical Problem List and Plan: 1.  L frontal ICH secondary to recent starting of Coumadin- Coumadin on hold with R hemiplegia             -patient may shower             -ELOS/Goals: 12/31- to f/u with Neuro at [facility] post d/c to decide on restarting  anticoag  -Continue CIR therapies including PT, OT, and SLP.  -Continue WHO at night.     2.  Portal vein thrombosis/A fib/Antithrombotics: -DVT/anticoagulation:  Pharmaceutical: SCDs due to thrombocytopenia and recent bleed.  --per neuro no coumadin-->will need to wait at least one month due to size of bleed.  --dopplers ok              -antiplatelet therapy: N/A 3. Pain Management: tylenol prn.   - kpad for neck pain  -will replace robaxin with flexeril prn  - has been using tramadol sparingly: decrease frequency to q8H prn 4. Mood: LCSW to follow up for evaluation and support.              -antipsychotic agents: N/A  -add trazodone qhs prn for sleep 5. Neuropsych: This patient is capable of making decisions on her own behalf. 6. Wound ulcer/Skin/Wound Care:Pack daily wound care LLE.    -turning, nutrition, education  12/14 -added scheduled benadryl for itching. Also receiving sarna        -prn vistaril  12/23- added eucerin with cortisone in it- will apply BID- see if that helps.  12/24- added eucerin and con't eucerin with cortisone.  7. Fluids/Electrolytes/Nutrition: Strict I/O.  K+ improved 12/28- continue to monitor labs with HD TTS  8. ESRD: Continue HD on TTS at the end of the day to help with therapy tolerance.  9. HTN: Monitor BP tid-currently controlled on current regimen 10. Pulmonary HTN: Oxygen dependent. Continue Ambrisentan and Selexipag. On Home dose 2L by [place] 11. Cirrhosis of the liver: Monitor platelets and for any signs of bleeding. Reporting abdominal distension.  (protal vein thrombus)  -f/u abd CT as outpt 12. Chronic systolic CHF: Monitor daily weights as well as I/O. Continue tadalafil.    Filed Weights   01/08/20 1333 01/08/20 1750 01/09/20 0600  Weight: 52.4 kg 51 kg 49.7 kg    -volume mgt per nephro  12/28: weight slightly up- continue to monitor.  13. CAF: Monitor HR tid--metoprolol has been on hold. Coumadin to be held due to [place].  14. Pancytopenia:  Continue to monitor with serial checks.   Mild cont to monitor   15. Constipation WITH hemorrhoids- seems to cycle between loose stool  -prn miralax only  12/13 pt with mushy,loose stools yet seems to report constipation   -backed of scheduled senna-s for now   -added daily fiber packet  -encouraging regular solid/liquid intake as  well  -stopped amitiza 12/14  -resumed senna-s at bedtime 12/15 16. Significant epistaxis (pt has prior history)  12/16 -recurrent bleeding in right nose---stopped with rocket   -ENT Benjamine Mola) is planning on potential cauterization this weekend  12/18-Dr. Benjamine Mola cauterized hypervascular areas in nasal septi  12/20-packing out today  12/22: appreciate follow-up, healing well, f/u with Dr. Benjamine Mola outpatient. No anti coag at this time , will be at higher risk once anticoag resumed  17. Insomnia:   12/20: continue 50mg  trazodone qhs prn 18. Right neck nodule appears resolved. Likely node  19. Bowel issues  12/24- will increase senokot from 1t ab prn to BID.   12/28: advised that husband bring in prunes which she loves- 6 per day 20. Sinus congestion: Continue Sudafed 21. Sore throat: add lozenges and throat spray prn.  22. Brown nasal discharge: likely residual clots from prior epistaxis and procedure- continue to monitor.   LOS: 22 days A FACE TO FACE EVALUATION WAS PERFORMED  Clide Deutscher Brynnley Dayrit 01/09/2020, 12:25 PM

## 2020-01-09 NOTE — Progress Notes (Signed)
Occupational Therapy Session Note  Patient Details  Name: Katie Nunez MRN: [id number] Date of Birth: [birth year]  Today's Date: 01/09/2020 OT Individual Time: 1030-1145 OT Individual Time Calculation (min): 75 min    Short Term Goals: Week 2:  OT Short Term Goal 1 (Week 2): Pt will don pants over feet with mod A. OT Short Term Goal 1 - Progress (Week 2): Met OT Short Term Goal 2 (Week 2): Pt will stand at toilet with mod A of 1. OT Short Term Goal 2 - Progress (Week 2): Met OT Short Term Goal 3 (Week 2): Patient will reposition R UE prior to transfer with min questioning cues OT Short Term Goal 3 - Progress (Week 2): Met OT Short Term Goal 4 (Week 2): Pt will complete self ROM with min cues OT Short Term Goal 4 - Progress (Week 2): Met Week 3:  OT Short Term Goal 1 (Week 3): LTG=STG 2/2 ELOS  Skilled Therapeutic Interventions/Progress Updates:    1:1. Pt received in bed agreeable to OT. Pt received with NT present in room finishing toileting. Pt completes grooming in w/c with no need for cuing to use BUE for opening toothpaste. Pt agreeable to OT providing PROM to wrist extensors and supination d/t tightness. Pt completes supine and seated NMR with deep pressure at wrist to facilitate extension of wrist as well as proprioceptive input: shoulder flexion/ext, chest press, elbbow flex/ext, pro/retraction, and int/ext rotation. Attempted use of flat board for facilitation of wrist extension and B arm attention/coordination, however pt too fatigue to achieve full ROM.   Written instructions for supine NMR/AAROM provided below Lay on you Back: 1.  Clasp your hands together, with straight arms raise your hands to shoulder height 2. With hands clasped together, upper arms resting on the bed, elbows bent, press your knuckles straight up to the ceiling-reach up 3. Support your Right elbow with your Left hand, shoulder flexed 45 degrees, bend your elbow to bring your hand to your face,  straighten your elbow all the way  Saebo placed on R deltoid at end of session Saebo Stim One 330 pulse width 35 Hz pulse rate On 8 sec/ off 8 sec Ramp up/ down 2 sec Symmetrical Biphasic wave form  Max intensity 126m at 500 Ohm load  Exited session with pt seated in w/c, exit alarm on and call light in reach. Upon return skin in tact from stim session.  Therapy Documentation Precautions:  Precautions Precautions: Fall Precaution Comments: Rt hemiplegia - subluxation Restrictions Weight Bearing Restrictions: No General:   Vital Signs: Therapy Vitals Temp: 98.3 F (36.8 C) Temp Source: Oral Pulse Rate: 87 Resp: 16 BP: 117/69 Patient Position (if appropriate): Lying Oxygen Therapy SpO2: 90 % O2 Device: Room Air Pain:   ADL: ADL Eating: Set up Grooming: Minimal assistance Upper Body Bathing: Moderate assistance Where Assessed-Upper Body Bathing: Chair Lower Body Bathing: Maximal assistance Where Assessed-Lower Body Bathing: Chair Upper Body Dressing: Maximal assistance Where Assessed-Upper Body Dressing: Chair Lower Body Dressing: Dependent Where Assessed-Lower Body Dressing: Chair Toileting: Dependent Where Assessed-Toileting: TGlass blower/designer Moderate assistance Toilet Transfer Method: Other (comment) (stedy) Vision   Perception    Praxis   Exercises:   Other Treatments:     Therapy/Group: Individual Therapy  STonny Branch12/29/2021, 6:54 AM

## 2020-01-09 NOTE — Progress Notes (Signed)
Physical Therapy Session Note  Patient Details  Name: Katie Nunez MRN: [id number] Date of Birth: 02-Dec-1955  Today's Date: 01/09/2020 PT Individual Time: [phone] and 1315-1415 PT Individual Time Calculation (min): 58 min and 60 min  Short Term Goals: Week 3:  PT Short Term Goal 1 (Week 3): =LTG due to ELOS  Skilled Therapeutic Interventions/Progress Updates:     1st Session: Pt received seated on EOB and agrees to therapy. Reports that both ankles are feeling sore this morning, L>R, and R shoulder sore as well. PT provides repositioning and rest breaks to manage pain. WC transport to gym for time management. Pt participates in standing fram activity with L le propped on 3 inch platform to encourage increased WB through R leg. Pt has difficulty attaining neutral posture and has tendency to allow R leg to over ground while standing only L leg. R sided WB improves with multimodal cues and increased time in standing frame.   Pt transfer to mat table with modA. Sit to supine with minA. Pt performs 3x10 supine single leg bridges on R leg with L leg crossed over R to promote increased WB. Pt able to clear buttocks around and inch and performs 10 second hold on final rep of each set. Supine to sit with minA. Squat pivot back to chair with modA. Left in [facility] with all needs within reach.  2nd Session: Pt received seated in WC finishing lunch. Pt misses initial 15 minutes of session due to requesting to finish lunch prior to therapy. Upon follow up pt is agreeable to therapy. No complaint of pain. WC transport to gym for time management. Pt performs sit to stand with minA, with RW. PT assist with strapping R hand in hand splint. Pt ambulates x15' with RW and mod/maxA, with PT facilitating R leg progression and R knee stability during stance phase. Pt has posterior and R sided lean due to pushing with L leg and limited WB through R leg. Pt performs biodex activity for NMR to provide tactile and visual  feedback on symmetrical WB. PT provides [place] for step up onto biodex. With bilateral upper extremity support, pt initially has weight distribution 100% on L leg and 0% on R leg. With tactile cueing at hips and R knee, pt is gradually able to improve to ~50% WB on either leg for short periods of time.   Pt performs squat pivot transfer to mat table with modA. Sit to supine with supervision. Pt performs 2x10 heel slides and hip abduction/adduciton. Pt able to complete heel slide with very light manual assistance from PT but requires significant assistance for abduction.  Squat pivot to WC toward L with minA. Pt left in [facility] with alarm intact and all needs within reach.  Therapy Documentation Precautions:  Precautions Precautions: Fall Precaution Comments: Rt hemiplegia - subluxation Restrictions Weight Bearing Restrictions: No    Therapy/Group: Individual Therapy  Breck Coons, PT, DPT 01/09/2020, 4:30 PM

## 2020-01-10 ENCOUNTER — Inpatient Hospital Stay (HOSPITAL_COMMUNITY): Payer: Medicare Other | Admitting: Occupational Therapy

## 2020-01-10 ENCOUNTER — Inpatient Hospital Stay (HOSPITAL_COMMUNITY): Payer: Medicare Other

## 2020-01-10 LAB — CBC
HCT: 33.1 % — ABNORMAL LOW (ref 36.0–46.0)
Hemoglobin: 10.6 g/dL — ABNORMAL LOW (ref 12.0–15.0)
MCH: 29.5 pg (ref 26.0–34.0)
MCHC: 32 g/dL (ref 30.0–36.0)
MCV: 92.2 fL (ref 80.0–100.0)
Platelets: 135 10*3/uL — ABNORMAL LOW (ref 150–400)
RBC: 3.59 MIL/uL — ABNORMAL LOW (ref 3.87–5.11)
RDW: 16.2 % — ABNORMAL HIGH (ref 11.5–15.5)
WBC: 5 10*3/uL (ref 4.0–10.5)
nRBC: 0 % (ref 0.0–0.2)

## 2020-01-10 LAB — RENAL FUNCTION PANEL
Albumin: 3.1 g/dL — ABNORMAL LOW (ref 3.5–5.0)
Anion gap: 16 — ABNORMAL HIGH (ref 5–15)
BUN: 60 mg/dL — ABNORMAL HIGH (ref 8–23)
CO2: 22 mmol/L (ref 22–32)
Calcium: 10.2 mg/dL (ref 8.9–10.3)
Chloride: 96 mmol/L — ABNORMAL LOW (ref 98–111)
Creatinine, Ser: 8.03 mg/dL — ABNORMAL HIGH (ref 0.44–1.00)
GFR, Estimated: 5 mL/min — ABNORMAL LOW (ref >60)
Glucose, Bld: 101 mg/dL — ABNORMAL HIGH (ref 70–99)
Phosphorus: 6.3 mg/dL — ABNORMAL HIGH (ref 2.5–4.6)
Potassium: 5.1 mmol/L (ref 3.5–5.1)
Sodium: 134 mmol/L — ABNORMAL LOW (ref 135–145)

## 2020-01-10 MED ORDER — COVID-19 MRNA VACC (MODERNA) 50 MCG/0.25ML IM SUSP
0.2500 mL | Freq: Once | INTRAMUSCULAR | Status: DC
  Filled 2020-01-10: qty 0.25

## 2020-01-10 MED ORDER — DARBEPOETIN ALFA 40 MCG/0.4ML IJ SOSY
PREFILLED_SYRINGE | INTRAMUSCULAR | Status: AC
  Administered 2020-01-10: 40 ug via INTRAVENOUS
  Filled 2020-01-10: qty 0.4

## 2020-01-10 MED ORDER — CARBAMIDE PEROXIDE 6.5 % OT SOLN
5.0000 [drp] | Freq: Two times a day (BID) | OTIC | Status: DC
  Administered 2020-01-10 – 2020-01-12 (×3): 5 [drp] via OTIC
  Filled 2020-01-10: qty 15

## 2020-01-10 MED ORDER — CINACALCET HCL 30 MG PO TABS
ORAL_TABLET | ORAL | Status: AC
  Filled 2020-01-10: qty 1

## 2020-01-10 NOTE — Plan of Care (Signed)
  Problem: Consults Goal: RH STROKE PATIENT EDUCATION Description: See Patient Education module for education specifics  Outcome: Progressing   Problem: RH BOWEL ELIMINATION Goal: RH STG MANAGE BOWEL WITH ASSISTANCE Description: STG Manage Bowel with [place]. Outcome: Progressing   Problem: RH SKIN INTEGRITY Goal: RH STG SKIN FREE OF INFECTION/BREAKDOWN Description: No new breakdown with min assist/cues  Outcome: Progressing Goal: RH STG ABLE TO PERFORM INCISION/WOUND CARE W/ASSISTANCE Description: STG Able To Perform Incision/Wound Care With Mod Assistance. Outcome: Progressing   Problem: RH COGNITION-NURSING Goal: RH STG ANTICIPATES NEEDS/CALLS FOR ASSIST W/ASSIST/CUES Description: STG Anticipates Needs/Calls for Assist With Min Assistance/Cues. Outcome: Progressing   Problem: RH PAIN MANAGEMENT Goal: RH STG PAIN MANAGED AT OR BELOW PT'S PAIN GOAL Description: < 4 out of 10.  Outcome: Progressing   Problem: RH KNOWLEDGE DEFICIT Goal: RH STG INCREASE KNOWLEDGE OF STROKE PROPHYLAXIS Description: Pt will demonstrate increased understanding of stroke prevention methods including chronic disease management, diet management, medication management, and follow up care with primary physician using the educational materials given while in rehab.  Outcome: Progressing

## 2020-01-10 NOTE — Progress Notes (Signed)
[facility] Progress Note   Subjective:   Seen during PT session. No acute complaints. For HD later today.  Objective Vitals:   01/09/20 0600 01/09/20 0610 01/09/20 2003 01/10/20 0613  BP:  117/69 125/72 113/85  Pulse:  87 89 91  Resp:  16 17 16   Temp:  98.3 F (36.8 C) 98.4 F (36.9 C) 98.6 F (37 C)  TempSrc:  Oral Oral Oral  SpO2:  90% 98% 95%  Weight: 49.7 kg   49.5 kg  Height:       Physical Exam General: Frail woman, NAD. Room air. Heart: RRR; no murmur Lungs: CTAB Abdomen: soft Extremities: No LE edema Dialysis Access: LUE AVF + thrill   Additional Objective Labs: Basic Metabolic Panel: Recent Labs  Lab 01/03/20 1434 01/06/20 1401 01/08/20 1433  NA 134* 136 134*  K 4.4 3.2* 3.9  CL 95* 97* 93*  CO2 24 24 24   GLUCOSE 101* 110* 90  BUN 51* 41* 62*  CREATININE 7.32* 6.12* 8.17*  CALCIUM 10.4* 9.5 10.7*  PHOS 5.2* 3.8 5.4*   Liver Function Tests: Recent Labs  Lab 01/03/20 1434 01/06/20 1401 01/08/20 1433  ALBUMIN 3.1* 3.2* 3.2*   CBC: Recent Labs  Lab 01/03/20 1433 01/06/20 1401 01/08/20 1432  WBC 4.8 4.8 6.6  HGB 10.2* 10.4* 11.0*  HCT 32.2* 31.2* 34.3*  MCV 91.0 91.2 91.7  PLT 148* 143* 162   Medications:  . ambrisentan  5 mg Oral Daily  . calcium acetate  1,334 mg Oral TID WC  . camphor-menthol   Topical BID  . carbamide peroxide  5 drop Both EARS BID  . Chlorhexidine Gluconate Cloth  6 each Topical BID  . cinacalcet  30 mg Oral Q T,Th,Sa-HD  . COVID-19 mRNA vaccine (Moderna)  0.25 mL Intramuscular Once  . darbepoetin (ARANESP) injection - DIALYSIS  40 mcg Intravenous Q Thu-HD  . feeding supplement (NEPRO CARB STEADY)  237 mL Oral BID BM  . Gerhardt's butt cream   Topical QID  . hydrocerin   Topical BID  . hydrocortisone  25 mg Rectal BID  . loratadine  10 mg Oral Daily  . pentoxifylline  400 mg Oral Daily  . psyllium  1 packet Oral BID  . Selexipag  800 mcg Oral BID  . senna-docusate  1 tablet Oral BID  .  sodium chloride  1 spray Each Nare 5 X Daily  . tadalafil  20 mg Oral Daily  . triamcinolone 0.1 % cream : eucerin   Topical BID    Dialysis Orders: SW TTS 3.5h 450/600 2/2.25 bath 58.5kg P2 LUA AVF Hep none - Mircera 50 mcg IV q 4 weeks - Hectorol 6 mcg IV TIW - Sensipar 30 mg PO TIW  Assessment/Plan: 1. Intracerebral hemorrhage: Suspected to be related to microvascular disease and warfarin - now stopped. Repeat CT scan of the head showing no extension of hematoma. Because she suffered significant neurological deficits of the right upper and lower extremities, she was admitted to CIR for intensive PT/OT. 2. ESRD:Continue HD per TTS sched - next today. No heparin. 3. Anemia: Hgb 11 - getting low dose Aranesp q Thurs - will hold if Hgb rises further. 4. CKD-MBD:Ca high, Phos ok. Will hold VDRA and check PTH, remains on sensipar. On Phoslo as binder - tells me tried other binders and had issues swallowing them and not interested in chewable binder. Will use low Ca bath with HD as well. 5. Nutrition: Alb low, continue Nepro supplements. 6.  Hypertension: BP stable without edema, she has lost quite a bit of body weight during prolonged hospitalization, will lower on discharge.  Veneta Penton, PA-C 01/10/2020, 11:36 AM  [employer]

## 2020-01-10 NOTE — Progress Notes (Signed)
[place] PHYSICAL MEDICINE & REHABILITATION PROGRESS NOTE   Subjective/Complaints: Katie Nunez continues to have brown, sometimes blood tinged discharge, from her nose. She denies any shortness of breath. Discussed that this is likely debris from her procedure from Dr. Benjamine Mola. She continues to feel fullness in ears- Debrox ear drops ordered.   ROS:   Pt denies SOB, abd pain, CP, N/V/C/D, and vision changes, sore throat is improved, +ear fullness bilaterally.    Objective:   No results found. Recent Labs    01/08/20 1432  WBC 6.6  HGB 11.0*  HCT 34.3*  PLT 162   Recent Labs    01/08/20 1433  NA 134*  K 3.9  CL 93*  CO2 24  GLUCOSE 90  BUN 62*  CREATININE 8.17*  CALCIUM 10.7*    Intake/Output Summary (Last 24 hours) at 01/10/2020 2376 Last data filed at 01/10/2020 2831 Gross per 24 hour  Intake 540 ml  Output --  Net 540 ml     Pressure Injury 12/18/19 Sacrum Medial Stage 1 -  Intact skin with non-blanchable redness of a localized area usually over a bony prominence. Red, non-blanchable spot over sacrum area (Active)  12/18/19 1707  Location: Sacrum  Location Orientation: Medial  Staging: Stage 1 -  Intact skin with non-blanchable redness of a localized area usually over a bony prominence.  Wound Description (Comments): Red, non-blanchable spot over sacrum area  Present on Admission: Yes   Physical Exam: Gen: no distress, normal appearing HEENT: oral mucosa pink and moist, NCAT Cardio: Reg rate Chest: normal effort, normal rate of breathing Abd: soft, non-distended Ext: no edema Psych: pleasant and cooperative- appropriate Skin:   LUE fistula intact. Left calf wound dressed. mild sacral redness present- also has a red area on R lower leg- no increased heat, no cellulitis seen Neuro: Pt is cognitively appropriate with normal insight, memory, and awareness. intact LT in all 4's. Reflexes are 2+ on left. 3+ right. Fine motor coordination is intact. No tremors.  Motor function is grossly 5/5 LUE and LLE.  RUE 2- elbow flex 2- finger flexors, trace tricep 0/5 wrist flex/ext, 3- R knee ext, 0/5 ankle  RLE 1 Hamstrings and HAD, otherwise 0/5 distally Musculoskeletal: minimal jt tenderness today   Vital Signs Blood pressure 113/85, pulse 91, temperature 98.6 F (37 C), temperature source Oral, resp. rate 16, height 5\' 5"  (1.651 m), weight 49.5 kg, last menstrual period 11/15/2008, SpO2 95 %.  Assessment/Plan: 1. Functional deficits which require 3+ hours per day of interdisciplinary therapy in a comprehensive inpatient rehab setting.  Physiatrist is providing close team supervision and 24 hour management of active medical problems listed below.  Physiatrist and rehab team continue to assess barriers to discharge/monitor patient progress toward functional and medical goals  Care Tool:  Bathing    Body parts bathed by patient: Chest,Abdomen,Right upper leg,Left upper leg,Right arm,Face,Front perineal area,Buttocks,Left arm,Left lower leg,Right lower leg   Body parts bathed by helper: Right lower leg,Left lower leg     Bathing assist Assist Level: Minimal Assistance - Patient > 75%     Upper Body Dressing/Undressing Upper body dressing   What is the patient wearing?: Pull over shirt    Upper body assist Assist Level: Minimal Assistance - Patient > 75%    Lower Body Dressing/Undressing Lower body dressing      What is the patient wearing?: Pants     Lower body assist Assist for lower body dressing: Moderate Assistance - Patient 50 - 74%  Toileting Toileting    Toileting assist Assist for toileting: Moderate Assistance - Patient 50 - 74%     Transfers Chair/bed transfer  Transfers assist     Chair/bed transfer assist level: Minimal Assistance - Patient > 75%     Locomotion Ambulation   Ambulation assist      Assist level: Maximal Assistance - Patient 25 - 49% Assistive device: Walker-rolling Max distance: 15'    Walk 10 feet activity   Assist     Assist level: Maximal Assistance - Patient 25 - 49% Assistive device: Walker-rolling   Walk 50 feet activity   Assist Walk 50 feet with 2 turns activity did not occur: Safety/medical concerns         Walk 150 feet activity   Assist Walk 150 feet activity did not occur: Safety/medical concerns         Walk 10 feet on uneven surface  activity   Assist Walk 10 feet on uneven surfaces activity did not occur: Safety/medical concerns         Wheelchair     Assist Will patient use wheelchair at discharge?: Yes Type of Wheelchair: Manual Wheelchair activity did not occur: Safety/medical concerns  Wheelchair assist level: Supervision/Verbal cueing Max wheelchair distance: 100'    Wheelchair 50 feet with 2 turns activity    Assist    Wheelchair 50 feet with 2 turns activity did not occur: Safety/medical concerns   Assist Level: Supervision/Verbal cueing   Wheelchair 150 feet activity     Assist  Wheelchair 150 feet activity did not occur: Safety/medical concerns       Blood pressure 113/85, pulse 91, temperature 98.6 F (37 C), temperature source Oral, resp. rate 16, height 5\' 5"  (1.651 m), weight 49.5 kg, last menstrual period 11/15/2008, SpO2 95 %.  Medical Problem List and Plan: 1.  L frontal ICH secondary to recent starting of Coumadin- Coumadin on hold with R hemiplegia             -patient may shower             -ELOS/Goals: 12/31- to f/u with Neuro at [facility] post d/c to decide on restarting  anticoag  -Continue CIR therapies including PT, OT, and SLP.  -Continue WHO at night.     2.  Portal vein thrombosis/A fib/Antithrombotics: -DVT/anticoagulation:  Pharmaceutical: SCDs due to thrombocytopenia and recent bleed.  --per neuro no coumadin-->will need to wait at least one month due to size of bleed.  --dopplers ok             -antiplatelet therapy: N/A 3. Pain Management: tylenol prn.   - kpad for  neck pain  -will replace robaxin with flexeril prn  - has been using tramadol sparingly: decreased to 50mg  q12H  4. Mood: LCSW to follow up for evaluation and support.              -antipsychotic agents: N/A  -add trazodone qhs prn for sleep 5. Neuropsych: This patient is capable of making decisions on her own behalf. 6. Wound ulcer/Skin/Wound Care:Pack daily wound care LLE.    -turning, nutrition, education  12/14 -added scheduled benadryl for itching. Also receiving sarna        -prn vistaril  12/23- added eucerin with cortisone in it- will apply BID- see if that helps.   12/24- added eucerin and con't eucerin with cortisone.  7. Fluids/Electrolytes/Nutrition: Strict I/O.  K+ improved 12/28- continue to monitor labs with HD TTS  8. ESRD: Continue HD  on TTS at the end of the day to help with therapy tolerance.  9. HTN: Monitor BP tid-currently controlled on current regimen 10. Pulmonary HTN: Oxygen dependent. Continue Ambrisentan and Selexipag. On Home dose 2L by [place] 11. Cirrhosis of the liver: Monitor platelets and for any signs of bleeding. Reporting abdominal distension.  (protal vein thrombus)  -f/u abd CT as outpt 12. Chronic systolic CHF: Monitor daily weights as well as I/O. Continue tadalafil.    Filed Weights   01/08/20 1750 01/09/20 0600 01/10/20 0613  Weight: 51 kg 49.7 kg 49.5 kg    -volume mgt per nephro  12/28: weight slightly up- continue to monitor.  13. CAF: Monitor HR tid--metoprolol has been on hold. Coumadin to be held due to [place].  14. Pancytopenia:  Continue to monitor with serial checks.   Mild cont to monitor   15. Constipation WITH hemorrhoids- seems to cycle between loose stool  -prn miralax only  12/13 pt with mushy,loose stools yet seems to report constipation   -backed of scheduled senna-s for now   -added daily fiber packet  -encouraging regular solid/liquid intake as well  -stopped amitiza 12/14  -resumed senna-s at bedtime 12/15 16. Significant  epistaxis (pt has prior history)  12/16 -recurrent bleeding in right nose---stopped with rocket   -ENT Benjamine Mola) is planning on potential cauterization this weekend  12/18-Dr. Benjamine Mola cauterized hypervascular areas in nasal septi  12/20-packing out today  12/22: appreciate follow-up, healing well, f/u with Dr. Benjamine Mola outpatient.  12/30: brown, blood tinged discharge from nose- discussed this may be debris from cauterization, advised to please let me know if worsens or becomes bright red.  No anti coag at this time , will be at higher risk once anticoag resumed  17. Insomnia:   12/20: continue 50mg  trazodone qhs prn 18. Right neck nodule appears resolved. Likely node  19. Bowel issues  12/24- will increase senokot from 1t ab prn to BID.   12/28: advised that husband bring in prunes which she loves- 6 per day 20. Sinus congestion: Continue Sudafed 21. Sore throat: add lozenges and throat spray prn.  22. Brown nasal discharge: likely residual clots from prior epistaxis and procedure- continue to monitor.  23. Ear fullness: start debrox ear drops BID  LOS: 23 days A FACE TO FACE EVALUATION WAS PERFORMED  Katie Nunez 01/10/2020, 9:37 AM

## 2020-01-10 NOTE — Progress Notes (Signed)
Physical Therapy Session Note  Patient Details  Name: Katie Nunez MRN: [id number] Date of Birth: [birth year]  Today's Date: 01/10/2020 PT Individual Time: 5638-9373; 1100-1155 PT Individual Time Calculation (min): 55 min and 55 min  Short Term Goals: Week 3:  PT Short Term Goal 1 (Week 3): =LTG due to ELOS  Skilled Therapeutic Interventions/Progress Updates:    Session 1: Patient received sitting up in wc talking with RN, agreeable to PT. She denies pain. Patient able to attempt to engage R UE in functional task applying lotion to L hand. PT propelling patient in wc for time management and energy conservation. Transfer to therapy mat with MinA stand pivot. Return to supine with supervision. U LE bridges completed 3x10 with emphasis on weightbearing through R LE and engagement of R hip extensors. SAQs performed 3x10 with additional vibration applied to R quad for further activation with good results. Hooklying hip abduction with theraband resistance 3x12 for improved hip stability. Patient with limited ability to complete same task in sitting due to weak R hip external rotators/abductors. Patient transferring back to wc via stand pivot with MinA. B LE used to propel wc x1ft with Max verbal cues to engage R LE. Patient returning to room in wc, seatbelt alarm on, call light within reach.    Session 2: Patient received sitting up in wc, agreeable to PT. She denies pain. She was able to propel wc using B LE for 61ft and Max verbal cues to coordinate R LE. PT propelling patient in wc remainder of the way to gym. Patient with productive cough this session, worse since previous session. Standing balance task with emphasis on midline posture, R knee appropriate extension.engagement and dual task with placing matching playing cards. Patient with difficulty completing dual task- when searching for matching card, she would begin to lean R more heavily. Consistent Burt provided for postural stability  throughout task. With repeated trials, patient able to improve self-correction of posture and R knee stability in stance. Progression to more complex block-building dual task with greater difficulty maintaining appropriate posture and alignment. Patient returning to room in wc, seatbelt alarm on, call light within reach.   Therapy Documentation Precautions:  Precautions Precautions: Fall Precaution Comments: Rt hemiplegia - subluxation Restrictions Weight Bearing Restrictions: No    Therapy/Group: Individual Therapy  Karoline Caldwell, PT, DPT, CBIS  01/10/2020, 7:33 AM

## 2020-01-10 NOTE — Progress Notes (Signed)
Occupational Therapy Weekly Progress Note  Patient Details  Name: Katie Nunez MRN: [id number] Date of Birth: [birth year]  Beginning of progress report period: December 19, 2019 End of progress report period: January 10, 2020  Today's Date: 01/10/2020 OT Individual Time: 4259-5638 OT Individual Time Calculation (min): 57 min    Patient is making steady progress towards OT goals. Pt has progressed to min A for most BADLs, but still needs mod A for toileting with clothing management, and occasional mod A for transfers. Pt has demonstrated good return of R UE and R LE this week and is beginning to use R UE in more functional ways. Pt has made such progress this week that we are asking to extend her LOS to further her independence with BADLs before dc home.   Patient continues to demonstrate the following deficits: muscle weakness, impaired timing and sequencing, abnormal tone, unbalanced muscle activation, ataxia and decreased coordination and decreased sitting balance, decreased standing balance, decreased postural control, hemiplegia and decreased balance strategies and therefore will continue to benefit from skilled OT intervention to enhance overall performance with BADL and Reduce care partner burden.  Patient progressing toward long term goals..  Continue plan of care.  OT Short Term Goals Week 4:  OT Short Term Goal 1 (Week 4): LTG=STG 2/2 ELOS  Skilled Therapeutic Interventions/Progress Updates:    Patient greeted semi-reclined in bed and agreeable to OT treatment session. Pt agreeable to shower today. Squat-pivot transfers with min A to the L but mod A to the R. Pt transferred onto tub bench in shower and worked on R UE NMR with washing body parts using R hand. Pt was able to maintain grip on wash cloth today for 25% of the time. Dressing completed at the sink with improved activation of R UE to help push through shirt sleeve. Min A sit<>stands, but min/mod A for dynamic balance when  puilling up pants. Hair blow drying completed at the sink with focus on posture and maintaining upright head and neck while doing hair. Pt left seated in wc at the sink finishing up grooming tasks.   Therapy Documentation Precautions:  Precautions Precautions: Fall Precaution Comments: Rt hemiplegia - subluxation Restrictions Weight Bearing Restrictions: No Pain:  denies pain   Therapy/Group: Individual Therapy  Valma Cava 01/10/2020, 8:29 AM

## 2020-01-11 ENCOUNTER — Inpatient Hospital Stay (HOSPITAL_COMMUNITY): Payer: Medicare Other

## 2020-01-11 ENCOUNTER — Inpatient Hospital Stay (HOSPITAL_COMMUNITY): Payer: Medicare Other | Admitting: Occupational Therapy

## 2020-01-11 LAB — PARATHYROID HORMONE, INTACT (NO CA): PTH: 13 pg/mL — ABNORMAL LOW (ref 15–65)

## 2020-01-11 MED ORDER — TRAMADOL HCL 50 MG PO TABS
50.0000 mg | ORAL_TABLET | Freq: Three times a day (TID) | ORAL | Status: DC | PRN
Start: 2020-01-11 — End: 2020-01-23
  Administered 2020-01-12 – 2020-01-20 (×4): 50 mg via ORAL
  Filled 2020-01-11 (×6): qty 1

## 2020-01-11 MED ORDER — OXYMETAZOLINE HCL 0.05 % NA SOLN
1.0000 | Freq: Two times a day (BID) | NASAL | Status: AC
  Administered 2020-01-11: 1 via NASAL
  Filled 2020-01-11: qty 30

## 2020-01-11 NOTE — Plan of Care (Signed)
  Problem: Consults Goal: RH STROKE PATIENT EDUCATION Description: See Patient Education module for education specifics  Outcome: Progressing   Problem: RH BOWEL ELIMINATION Goal: RH STG MANAGE BOWEL WITH ASSISTANCE Description: STG Manage Bowel with [place]. Outcome: Progressing   Problem: RH SKIN INTEGRITY Goal: RH STG SKIN FREE OF INFECTION/BREAKDOWN Description: No new breakdown with min assist/cues  Outcome: Progressing Goal: RH STG ABLE TO PERFORM INCISION/WOUND CARE W/ASSISTANCE Description: STG Able To Perform Incision/Wound Care With Mod Assistance. Outcome: Progressing   Problem: RH COGNITION-NURSING Goal: RH STG ANTICIPATES NEEDS/CALLS FOR ASSIST W/ASSIST/CUES Description: STG Anticipates Needs/Calls for Assist With Min Assistance/Cues. Outcome: Progressing   Problem: RH PAIN MANAGEMENT Goal: RH STG PAIN MANAGED AT OR BELOW PT'S PAIN GOAL Description: < 4 out of 10.  Outcome: Progressing   Problem: RH KNOWLEDGE DEFICIT Goal: RH STG INCREASE KNOWLEDGE OF STROKE PROPHYLAXIS Description: Pt will demonstrate increased understanding of stroke prevention methods including chronic disease management, diet management, medication management, and follow up care with primary physician using the educational materials given while in rehab.  Outcome: Progressing

## 2020-01-11 NOTE — Progress Notes (Signed)
Occupational Therapy Session Note  Patient Details  Name: Katie Nunez MRN: [id number] Date of Birth: 10-Apr-1955  Today's Date: 01/11/2020  Session 1 OT Individual Time: 9030-0923 OT Individual Time Calculation (min): 58 min   Session 2 OT Individual Time: 3007-6226 OT Individual Time Calculation (min): 75 min    Short Term Goals: Week 4:  OT Short Term Goal 1 (Week 4): LTG=STG 2/2 ELOS  Skilled Therapeutic Interventions/Progress Updates:  Session 1   Pt greeted seated EOB after finishing breakfast and agreeable to OT treatment session. Blocked practice for UB and LB dressing seated EOB with pt needing min cues, OT assist to hook bra, but pt able to don shirt without physical assist from OT! Pt able to thread both pant legs, then sit<>stand with min A and RW. [employer]  forced use of R UE to try to help pull pants up in standing, but still required OT assist. Min A squat-pivot to wc. Grooming tasks completed at the sink with focus on integrating R UE with wringing out wash cloths and stabilizing containers. OT attempted to place shoe buttons on tennis shoes for fastening, but correct shoe buttons were unavailable. OT placed SAEBO e-stim. SAEBO left on for 60 minutes on wrist extensors. OT returned to remove SAEBO with skin intact and no adverse reactions.  [place] Stim One 330 pulse width 35 Hz pulse rate On 8 sec/ off 8 sec Ramp up/ down 2 sec Symmetrical Biphasic wave form  Max intensity 166m at 500 Ohm load  Pt left seated in wc at end of session with call bell in reach and needs met.   Session 2 Pt greeted seated in wc finishing lunch. Educated on one handed techniques for cooking and educated on one handed cooking devices including rocker knife and one handed adapted cutting board. Pt was able to finish meal with set-up A. Pt brought to therapy gym in wc. Worked on R UE NMR with [employer] arm bike. Pt completed 1 minute intervals pushing with R UE. Pt needed Ace wrap t keep  hand on handle, then guided OT assist to complete full ROM. Continued working on functional use of R UE with picking up large foam bvlocks and placing them in a cup. OT min A to guide movements and facilitate normal movement patterns. Also trying to facilitate more wrist extension with grasp and release. OT issued softest Tan theraputty and worked on fPsychologist, sport and exercise Pt returned to room and left seated in wc with needs met.   Therapy Documentation Precautions:  Precautions Precautions: Fall Precaution Comments: Rt hemiplegia - subluxation Restrictions Weight Bearing Restrictions: No Pain:   denies pain  Therapy/Group: Individual Therapy  EValma Cava12/31/2021, 1:46 PM

## 2020-01-11 NOTE — Progress Notes (Signed)
Subjective: Pt reports a few episodes of minor epistaxis over the last week. The bleeding spontaneously resolved.   Objective: Vital signs in last 24 hours: Temp:  [97.9 F (36.6 C)-98.9 F (37.2 C)] 97.9 F (36.6 C) (12/31 3235) Pulse Rate:  [77-105] 93 (12/31 0613) Resp:  [15-16] 16 (12/31 0613) BP: (103-142)/(51-72) 130/68 (12/31 0613) SpO2:  [90 %-97 %] 90 % (12/31 5732) Weight:  [51.5 kg-52.4 kg] 52.4 kg (12/31 2025)  Physical Exam: General appearance:alert, cooperative and no distress Head:Normocephalic, without obvious abnormality, atraumatic Eyes: Pupils are equal, round, reactive to light. Extraocular motion is intact.  Ears: Examination of the ears shows normal auricles and external auditory canals bilaterally. Nose:Septum is healing well.No bleeding is noted. Face: Facial examination shows no asymmetry. Palpation of the face elicit no significant tenderness.  Mouth: Oral cavity examination shows no mucosal lacerations. No significant trismus is noted.  Neck: Palpation of the neck reveals no lymphadenopathy or mass. The trachea is midline.   Recent Labs    01/08/20 1432 01/10/20 1416  WBC 6.6 5.0  HGB 11.0* 10.6*  HCT 34.3* 33.1*  PLT 162 135*   Recent Labs    01/08/20 1433 01/10/20 1416  NA 134* 134*  K 3.9 5.1  CL 93* 96*  CO2 24 22  GLUCOSE 90 101*  BUN 62* 60*  CREATININE 8.17* 8.03*  CALCIUM 10.7* 10.2    Medications:  I have reviewed the patient's current medications. Scheduled: . ambrisentan  5 mg Oral Daily  . calcium acetate  1,334 mg Oral TID WC  . camphor-menthol   Topical BID  . carbamide peroxide  5 drop Both EARS BID  . Chlorhexidine Gluconate Cloth  6 each Topical BID  . cinacalcet  30 mg Oral Q T,Th,Sa-HD  . COVID-19 mRNA vaccine (Moderna)  0.25 mL Intramuscular Once  . darbepoetin (ARANESP) injection - DIALYSIS  40 mcg Intravenous Q Thu-HD  . feeding supplement (NEPRO CARB STEADY)  237 mL Oral BID BM  . Gerhardt's butt  cream   Topical QID  . hydrocerin   Topical BID  . hydrocortisone  25 mg Rectal BID  . loratadine  10 mg Oral Daily  . oxymetazoline  1 spray Each Nare BID  . pentoxifylline  400 mg Oral Daily  . psyllium  1 packet Oral BID  . Selexipag  800 mcg Oral BID  . senna-docusate  1 tablet Oral BID  . sodium chloride  1 spray Each Nare 5 X Daily  . tadalafil  20 mg Oral Daily  . triamcinolone 0.1 % cream : eucerin   Topical BID   Continuous:   Assessment/Plan: Recurrent epistaxis,s/p bilateral nasal cautery in the OR. - No active bleeding today. - Nasal septum is healing well. - Continue nasal ointment and humidifier to prevent nasal dryness. - Pt may follow up in my office after discharge.    LOS: 24 days   Katie Nunez Katie Nunez Katie Nunez 01/11/2020, 1:11 PM

## 2020-01-11 NOTE — Progress Notes (Signed)
PHYSICAL MEDICINE & REHABILITATION PROGRESS NOTE   Subjective/Complaints: Mrs. Katie Nunez continues to have brown and yellow discharge from her nose- sometimes bright red.  Hgb 10.6 12/30 Cr 8.03  ROS:  Pt denies SOB, abd pain, CP, N/V/C/D, and vision changes, sore throat is improved, +ear fullness bilaterally.    Objective:   No results found. Recent Labs    01/08/20 1432 01/10/20 1416  WBC 6.6 5.0  HGB 11.0* 10.6*  HCT 34.3* 33.1*  PLT 162 135*   Recent Labs    01/08/20 1433 01/10/20 1416  NA 134* 134*  K 3.9 5.1  CL 93* 96*  CO2 24 22  GLUCOSE 90 101*  BUN 62* 60*  CREATININE 8.17* 8.03*  CALCIUM 10.7* 10.2    Intake/Output Summary (Last 24 hours) at 01/11/2020 1111 Last data filed at 01/11/2020 9485 Gross per 24 hour  Intake 480 ml  Output 1500 ml  Net -1020 ml     Pressure Injury 12/18/19 Sacrum Medial Stage 1 -  Intact skin with non-blanchable redness of a localized area usually over a bony prominence. Red, non-blanchable spot over sacrum area (Active)  12/18/19 1707  Location: Sacrum  Location Orientation: Medial  Staging: Stage 1 -  Intact skin with non-blanchable redness of a localized area usually over a bony prominence.  Wound Description (Comments): Red, non-blanchable spot over sacrum area  Present on Admission: Yes   Physical Exam: Gen: no distress, normal appearing HEENT: oral mucosa pink and moist, NCAT Cardio: Reg rate Chest: normal effort, normal rate of breathing Abd: soft, non-distended Ext: no edema Psych: pleasant and cooperative- appropriate Skin:   LUE fistula intact. Left calf wound dressed. mild sacral redness present- also has a red area on R lower leg- no increased heat, no cellulitis seen Neuro: Pt is cognitively appropriate with normal insight, memory, and awareness. intact LT in all 4's. Reflexes are 2+ on left. 3+ right. Fine motor coordination is intact. No tremors. Motor function is grossly 5/5 LUE and LLE.  RUE  2- elbow flex 2- finger flexors, trace tricep 0/5 wrist flex/ext, 3- R knee ext, 0/5 ankle  RLE 1 Hamstrings and HAD, otherwise 0/5 distally Musculoskeletal: minimal jt tenderness today   Vital Signs Blood pressure 130/68, pulse 93, temperature 97.9 F (36.6 C), temperature source Oral, resp. rate 16, height 5\' 5"  (1.651 m), weight 52.4 kg, last menstrual period 11/15/2008, SpO2 90 %.  Assessment/Plan: 1. Functional deficits which require 3+ hours per day of interdisciplinary therapy in a comprehensive inpatient rehab setting.  Physiatrist is providing close team supervision and 24 hour management of active medical problems listed below.  Physiatrist and rehab team continue to assess barriers to discharge/monitor patient progress toward functional and medical goals  Care Tool:  Bathing    Body parts bathed by patient: Chest,Abdomen,Right upper leg,Left upper leg,Right arm,Face,Front perineal area,Buttocks,Left arm,Left lower leg,Right lower leg   Body parts bathed by helper: Right lower leg,Left lower leg     Bathing assist Assist Level: Minimal Assistance - Patient > 75%     Upper Body Dressing/Undressing Upper body dressing   What is the patient wearing?: Pull over shirt    Upper body assist Assist Level: Minimal Assistance - Patient > 75%    Lower Body Dressing/Undressing Lower body dressing      What is the patient wearing?: Pants     Lower body assist Assist for lower body dressing: Moderate Assistance - Patient 50 - 74%     Toileting Toileting  Toileting assist Assist for toileting: Moderate Assistance - Patient 50 - 74%     Transfers Chair/bed transfer  Transfers assist     Chair/bed transfer assist level: Minimal Assistance - Patient > 75%     Locomotion Ambulation   Ambulation assist      Assist level: Maximal Assistance - Patient 25 - 49% Assistive device: Walker-rolling Max distance: 15'   Walk 10 feet activity   Assist      Assist level: Maximal Assistance - Patient 25 - 49% Assistive device: Walker-rolling   Walk 50 feet activity   Assist Walk 50 feet with 2 turns activity did not occur: Safety/medical concerns         Walk 150 feet activity   Assist Walk 150 feet activity did not occur: Safety/medical concerns         Walk 10 feet on uneven surface  activity   Assist Walk 10 feet on uneven surfaces activity did not occur: Safety/medical concerns         Wheelchair     Assist Will patient use wheelchair at discharge?: Yes Type of Wheelchair: Manual Wheelchair activity did not occur: Safety/medical concerns  Wheelchair assist level: Minimal Assistance - Patient > 75% (B LE) Max wheelchair distance: 45    Wheelchair 50 feet with 2 turns activity    Assist    Wheelchair 50 feet with 2 turns activity did not occur: Safety/medical concerns   Assist Level: Supervision/Verbal cueing   Wheelchair 150 feet activity     Assist  Wheelchair 150 feet activity did not occur: Safety/medical concerns       Blood pressure 130/68, pulse 93, temperature 97.9 F (36.6 C), temperature source Oral, resp. rate 16, height 5\' 5"  (1.651 m), weight 52.4 kg, last menstrual period 11/15/2008, SpO2 90 %.  Medical Problem List and Plan: 1.  L frontal ICH secondary to recent starting of Coumadin- Coumadin on hold with R hemiplegia             -patient may shower             -ELOS/Goals: 12/31- to f/u with Neuro at [facility] post d/c to decide on restarting  anticoag  -Continue CIR therapies including PT, OT, and SLP.  -Continue WHO at night.     2.  Portal vein thrombosis/A fib/Antithrombotics: -DVT/anticoagulation:  Pharmaceutical: SCDs due to thrombocytopenia and recent bleed.  --per neuro no coumadin-->will need to wait at least one month due to size of bleed.  --dopplers ok             -antiplatelet therapy: N/A 3. Pain Management: tylenol prn.   - kpad for neck pain  -will replace  robaxin with flexeril prn  - has been using tramadol sparingly: increase to q8H prn as per patient request. 4. Mood: LCSW to follow up for evaluation and support.              -antipsychotic agents: N/A  -add trazodone qhs prn for sleep 5. Neuropsych: This patient is capable of making decisions on her own behalf. 6. Wound ulcer/Skin/Wound Care:Pack daily wound care LLE.    -turning, nutrition, education  12/14 -added scheduled benadryl for itching. Also receiving sarna        -prn vistaril  12/23- added eucerin with cortisone in it- will apply BID- see if that helps.   12/24- added eucerin and con't eucerin with cortisone.  7. Fluids/Electrolytes/Nutrition: Strict I/O.  K+ 5.1 on 12/30- conitnue to monitor labs with HD TTS  8. ESRD: Continue HD on TTS at the end of the day to help with therapy tolerance.  9. HTN: Monitor BP tid-currently controlled on current regimen 10. Pulmonary HTN: Oxygen dependent. Continue Ambrisentan and Selexipag. On Home dose 2L by [place] 11. Cirrhosis of the liver: Monitor platelets and for any signs of bleeding. Reporting abdominal distension.  (protal vein thrombus)  -f/u abd CT as outpt 12. Chronic systolic CHF: Monitor daily weights as well as I/O. Continue tadalafil.    Filed Weights   01/10/20 0613 01/10/20 1700 01/11/20 0613  Weight: 49.5 kg 51.5 kg 52.4 kg    -volume mgt per nephro  12/28: weight slightly up- continue to monitor.  13. CAF: Monitor HR tid--metoprolol has been on hold. Coumadin to be held due to [place].  14. Pancytopenia:  Continue to monitor with serial checks.   Mild cont to monitor   15. Constipation WITH hemorrhoids- seems to cycle between loose stool  -prn miralax only  12/13 pt with mushy,loose stools yet seems to report constipation   -backed of scheduled senna-s for now   -added daily fiber packet  -encouraging regular solid/liquid intake as well  -stopped amitiza 12/14  -resumed senna-s at bedtime 12/15 16. Significant epistaxis  (pt has prior history)  12/16 -recurrent bleeding in right nose---stopped with rocket   -ENT Benjamine Mola) is planning on potential cauterization this weekend  12/18-Dr. Benjamine Mola cauterized hypervascular areas in nasal septi  12/20-packing out today  12/22: appreciate follow-up, healing well, f/u with Dr. Benjamine Mola outpatient.  12/30: brown, blood tinged discharge from nose- discussed this may be debris from cauterization, advised to please let me know if worsens or becomes bright red.  No anti coag at this time , will be at higher risk once anticoag resumed  17. Insomnia:   12/20: continue 50mg  trazodone qhs prn 18. Right neck nodule appears resolved. Likely node  19. Bowel issues  12/24- will increase senokot from 1t ab prn to BID.   12/28: advised that husband bring in prunes which she loves- 6 per day 20. Sinus congestion: Continue Sudafed 21. Sore throat: add lozenges and throat spray prn.  22. Brown nasal discharge: likely residual clots from prior epistaxis and procedure- continue to monitor. Continue Afrin nasal spray.  23. Ear fullness: start debrox ear drops BID 24. Hemorrhoids: ordered sitz bath, Anusol does not appear to be helping much, encouraged her to use witch hazel pads.   LOS: 24 days A FACE TO FACE EVALUATION WAS PERFORMED  Katie Nunez Anne Boltz 01/11/2020, 11:11 AM

## 2020-01-11 NOTE — Progress Notes (Signed)
Physical Therapy Session Note  Patient Details  Name: Katie Nunez MRN: [id number] Date of Birth: 1955/09/21  Today's Date: 01/11/2020 PT Individual Time: [phone] and [phone] PT Individual Time Calculation (min): 29 min and 47min  Short Term Goals: Week 3:  PT Short Term Goal 1 (Week 3): =LTG due to ELOS  Skilled Therapeutic Interventions/Progress Updates:      1st Session: Pt received seated in [facility] and agrees to therapy. No complaint of pain. WC transport to gym for time management. Pt cued to propel WC as fast as possible with R leg for 30', focusing on knee flexion. PT provides multimodal cues on sequencing and body mechanics to complete. Pt propels 30' in 2:20. Pt then performs same activity but propelling backward, with focus on knee extension. Pt completes 30' in 1:11.  Pt performs 2x10 LAQs with R leg and is able to move through full ROM against gravity. Then performs 1x10 seated hamstring curls with Level 1 theraband. Pt left seated din WC with alarm intact and all needs within reach.   2nd Session: Pt received seated in recliner and agrees to therapy. Reports soreness in R hand. Number not provided. PT provides rest breaks and repositioning to manage pain. WC transport to gym for time management. Pt performs NMR in parallel bars. MinA for sit to stand. Pt performs sidestepping facing mirror for visual feedback, focusing on hip abductor activation and R knee stability during stance phase. x20' total with PT providing CGA/minA for tactile cueing and R quad and knee. Pt requesting trial of AFO. PT provides pt with R PLS AFO. Pt ambulates x10' in parallel bars with modA for managing R leg during swing phase and controlling eccentric flexion during early stance phase. Pt has improved toe clearance with AFO but verbalizes that she does not like the way it feels. Pt Performs NMR for R leg in standing, with PT providing modA at hip and R knee and pt performing lateral toe taps on 2 inch  blocks. Following AFO removal, pt attempts plantarflexion and is able to move through full ROM! Minimal dorsiflexion noted, however. WC transport back to room. Pt left seated in WC with all needs within reach.  Therapy Documentation Precautions:  Precautions Precautions: Fall Precaution Comments: Rt hemiplegia - subluxation Restrictions Weight Bearing Restrictions: No    Therapy/Group: Individual Therapy  Breck Coons, PT, DPT 01/11/2020, 10:10 AM

## 2020-01-12 ENCOUNTER — Inpatient Hospital Stay (HOSPITAL_COMMUNITY): Payer: Medicare Other | Admitting: Occupational Therapy

## 2020-01-12 DIAGNOSIS — N186 End stage renal disease: Secondary | ICD-10-CM

## 2020-01-12 DIAGNOSIS — D696 Thrombocytopenia, unspecified: Secondary | ICD-10-CM

## 2020-01-12 DIAGNOSIS — G479 Sleep disorder, unspecified: Secondary | ICD-10-CM

## 2020-01-12 DIAGNOSIS — R040 Epistaxis: Secondary | ICD-10-CM

## 2020-01-12 DIAGNOSIS — K5901 Slow transit constipation: Secondary | ICD-10-CM

## 2020-01-12 DIAGNOSIS — G8191 Hemiplegia, unspecified affecting right dominant side: Secondary | ICD-10-CM

## 2020-01-12 DIAGNOSIS — Z992 Dependence on renal dialysis: Secondary | ICD-10-CM

## 2020-01-12 DIAGNOSIS — K649 Unspecified hemorrhoids: Secondary | ICD-10-CM

## 2020-01-12 DIAGNOSIS — I5022 Chronic systolic (congestive) heart failure: Secondary | ICD-10-CM

## 2020-01-12 LAB — CBC
HCT: 31.5 % — ABNORMAL LOW (ref 36.0–46.0)
Hemoglobin: 10.2 g/dL — ABNORMAL LOW (ref 12.0–15.0)
MCH: 29.7 pg (ref 26.0–34.0)
MCHC: 32.4 g/dL (ref 30.0–36.0)
MCV: 91.6 fL (ref 80.0–100.0)
Platelets: 123 10*3/uL — ABNORMAL LOW (ref 150–400)
RBC: 3.44 MIL/uL — ABNORMAL LOW (ref 3.87–5.11)
RDW: 16.2 % — ABNORMAL HIGH (ref 11.5–15.5)
WBC: 5.6 10*3/uL (ref 4.0–10.5)
nRBC: 0 % (ref 0.0–0.2)

## 2020-01-12 LAB — RENAL FUNCTION PANEL
Albumin: 3 g/dL — ABNORMAL LOW (ref 3.5–5.0)
Anion gap: 15 (ref 5–15)
BUN: 52 mg/dL — ABNORMAL HIGH (ref 8–23)
CO2: 23 mmol/L (ref 22–32)
Calcium: 9.3 mg/dL (ref 8.9–10.3)
Chloride: 96 mmol/L — ABNORMAL LOW (ref 98–111)
Creatinine, Ser: 7.26 mg/dL — ABNORMAL HIGH (ref 0.44–1.00)
GFR, Estimated: 6 mL/min — ABNORMAL LOW (ref >60)
Glucose, Bld: 75 mg/dL (ref 70–99)
Phosphorus: 5.5 mg/dL — ABNORMAL HIGH (ref 2.5–4.6)
Potassium: 4.6 mmol/L (ref 3.5–5.1)
Sodium: 134 mmol/L — ABNORMAL LOW (ref 135–145)

## 2020-01-12 MED ORDER — CHLORHEXIDINE GLUCONATE CLOTH 2 % EX PADS
6.0000 | MEDICATED_PAD | Freq: Every day | CUTANEOUS | Status: DC
  Administered 2020-01-12: 6 via TOPICAL

## 2020-01-12 NOTE — Progress Notes (Signed)
[facility] Progress Note   Subjective:   Seen in room. Reports she is still having occasional short nose bleeds and some congestion, but improved. No SOB, CP, palpitations, dizziness, nausea or vomiting.   Objective Vitals:   01/11/20 0613 01/11/20 1347 01/11/20 1921 01/12/20 0508  BP: 130/68 117/65 117/66 (!) 111/57  Pulse: 93 95 98 98  Resp: 16 18 18 16   Temp: 97.9 F (36.6 C) 98.9 F (37.2 C) 98 F (36.7 C) (!) 97.4 F (36.3 C)  TempSrc: Oral   Axillary  SpO2: 90% 91% 97% 93%  Weight: 52.4 kg   52.5 kg  Height:       Physical Exam General: Frail appearing female, alert and in NAD Heart: RRR, no murmurs, rubs or gallops Lungs: CTA bilaterally without wheezing, rhonchi or rales Abdomen: Soft, non-tender, non-distended Extremities: No edema b/l lower extremities Dialysis Access: LUE AVF + thrill  Additional Objective Labs: Basic Metabolic Panel: Recent Labs  Lab 01/06/20 1401 01/08/20 1433 01/10/20 1416  NA 136 134* 134*  K 3.2* 3.9 5.1  CL 97* 93* 96*  CO2 24 24 22   GLUCOSE 110* 90 101*  BUN 41* 62* 60*  CREATININE 6.12* 8.17* 8.03*  CALCIUM 9.5 10.7* 10.2  PHOS 3.8 5.4* 6.3*   Liver Function Tests: Recent Labs  Lab 01/06/20 1401 01/08/20 1433 01/10/20 1416  ALBUMIN 3.2* 3.2* 3.1*   CBC: Recent Labs  Lab 01/06/20 1401 01/08/20 1432 01/10/20 1416  WBC 4.8 6.6 5.0  HGB 10.4* 11.0* 10.6*  HCT 31.2* 34.3* 33.1*  MCV 91.2 91.7 92.2  PLT 143* 162 135*   Medications:  . ambrisentan  5 mg Oral Daily  . calcium acetate  1,334 mg Oral TID WC  . camphor-menthol   Topical BID  . carbamide peroxide  5 drop Both EARS BID  . Chlorhexidine Gluconate Cloth  6 each Topical BID  . cinacalcet  30 mg Oral Q T,Th,Sa-HD  . COVID-19 mRNA vaccine (Moderna)  0.25 mL Intramuscular Once  . darbepoetin (ARANESP) injection - DIALYSIS  40 mcg Intravenous Q Thu-HD  . feeding supplement (NEPRO CARB STEADY)  237 mL Oral BID BM  . Gerhardt's butt cream    Topical QID  . hydrocerin   Topical BID  . hydrocortisone  25 mg Rectal BID  . loratadine  10 mg Oral Daily  . oxymetazoline  1 spray Each Nare BID  . pentoxifylline  400 mg Oral Daily  . psyllium  1 packet Oral BID  . Selexipag  800 mcg Oral BID  . senna-docusate  1 tablet Oral BID  . sodium chloride  1 spray Each Nare 5 X Daily  . tadalafil  20 mg Oral Daily  . triamcinolone 0.1 % cream : eucerin   Topical BID    Dialysis Orders: SW TTS 3.5h 450/600 2/2.25 bath 58.5kg P2 LUA AVF Hep none - Mircera 50 mcg IV q 4 weeks - Hectorol 6 mcg IV TIW - Sensipar 30 mg PO TIW  Assessment/Plan: 1. Intracerebral hemorrhage: Suspected to be related to microvascular disease andwarfarin - now stopped.Repeat CT scan of the head showing no extension of hematoma. Because she suffered significant neurological deficits of the right upper and lower extremities, she was admitted to CIR for intensive PT/OT. 2. ESRD:Continue HD per TTS sched - next today. No heparin. 3. Anemia:Hgb 10.6 - getting low dose Aranesp q Thurs 4. CKD-MBD:Ca high but improving, Phos ok. PTH 13- will stop VDRA and sensipar. On Phoslo as binder -  tells me tried other binders and had issues swallowing them and not interested in chewable binder. ? Try powder if calcium increases further. Will use low Ca bath with HD as well. 5. Nutrition:Alb low, continue Nepro supplements. Diet liberalized to regular with fluid restrictions.  6. Hypertension: BP stable without edema, she has lost quite a bit of body weight during prolonged hospitalization, will lower on discharge.   Anice Paganini, PA-C 01/12/2020, 10:32 AM  [facility] Pager: [phone]

## 2020-01-12 NOTE — Progress Notes (Signed)
Occupational Therapy Session Note  Patient Details  Name: Katie Nunez MRN: [id number] Date of Birth: 17-Nov-1955  Today's Date: 01/12/2020 OT Individual Time: 1856-3149 OT Individual Time Calculation (min): 45 min    Short Term Goals: Week 3:  OT Short Term Goal 1 (Week 3): LTG=STG 2/2 ELOS  Skilled Therapeutic Interventions/Progress Updates: Patient sitting up supine in bed and agreeable to OT session to increase functional skills.   Patient participated and neuro reducation trunk, hips, legs, upper extremities and right scapular mobilization (especially for upward and downward rotation) to increasing self care and in prep for more independent transfers.   In her affected right upper extremity, she exhibited some active movement inforearm to wrist and was able to complete gross forearm tasks.  She required total to moderate assistance to utlize right fine motor and dexterity tasks as well as shoulder use, including crossing midline to complete assisted tasks and self range of motion exercises.  She elected not to dress in the short time period she was scheduled OT session.    She also participated in right environmental tasks in order to more thoroughly attend to to her right environment to increase safety in movements and to attend more often to her right hand.  As well, patient required Min A to maintain neutral postural and left lower extremity control when completing neuro reducation activities utilizing right side of body.   She tended to hike up left lower extremity.  More experiences to work on the aforementioned and other therapeutic, skilled areas will help increase patient independence and safety when complete self care and IADLs.  Continue OT plan of care.     Therapy Documentation Precautions:  Precautions Precautions: Fall Precaution Comments: Rt hemiplegia - subluxation Restrictions Weight Bearing Restrictions: No   Therapy/Group: Individual Therapy  Alfredia Ferguson [facility] 01/12/2020, 1:12 PM

## 2020-01-12 NOTE — Progress Notes (Signed)
[place] PHYSICAL MEDICINE & REHABILITATION PROGRESS NOTE   Subjective/Complaints: Patient seen sitting up in bed this AM.  She states she slept fairly overnight due to right hand pain, which improved with tramadol.  She notes a cough as well.  She also complains about constipation versus hemorrhoids.  ROS: + Constipation versus hemorrhoids. Denies CP, SOB, N/V/D  Objective:   No results found. Recent Labs    01/10/20 1416 01/12/20 1329  WBC 5.0 5.6  HGB 10.6* 10.2*  HCT 33.1* 31.5*  PLT 135* 123*   Recent Labs    01/10/20 1416 01/12/20 1329  NA 134* 134*  K 5.1 4.6  CL 96* 96*  CO2 22 23  GLUCOSE 101* 75  BUN 60* 52*  CREATININE 8.03* 7.26*  CALCIUM 10.2 9.3    Intake/Output Summary (Last 24 hours) at 01/12/2020 1547 Last data filed at 01/12/2020 0510 Gross per 24 hour  Intake 300 ml  Output 0 ml  Net 300 ml     Pressure Injury 12/18/19 Sacrum Medial Stage 1 -  Intact skin with non-blanchable redness of a localized area usually over a bony prominence. Red, non-blanchable spot over sacrum area (Active)  12/18/19 1707  Location: Sacrum  Location Orientation: Medial  Staging: Stage 1 -  Intact skin with non-blanchable redness of a localized area usually over a bony prominence.  Wound Description (Comments): Red, non-blanchable spot over sacrum area  Present on Admission: Yes   Physical Exam: Constitutional: No distress . Vital signs reviewed. HENT: Normocephalic.  Atraumatic. Eyes: EOMI. No discharge. Cardiovascular: No JVD.  RRR. Respiratory: Normal effort.  No stridor.  Bilateral clear to auscultation. GI: Non-distended.  BS +. Skin: Warm and dry.  Sacral ulcer not examined today. Left lower extremity with dressing CDI Psych: Normal mood.  Normal behavior. Musc: No edema in extremities.  No tenderness in extremities. Neuro: Alert Motor: LUE/LE: 5/5 proximal distal RUE: Shoulder abduction, elbow flexion/extension 4/5, handgrip 1+/5 Right lower extremity:  4/5 hip flexion, knee extension, 0/5 ankle dorsiflexion Vital Signs Blood pressure (!) 102/55, pulse 97, temperature 97.7 F (36.5 C), temperature source Oral, resp. rate 18, height 5\' 5"  (1.651 m), weight 52.5 kg, last menstrual period 11/15/2008, SpO2 98 %.  Assessment/Plan: 1. Functional deficits which require 3+ hours per day of interdisciplinary therapy in a comprehensive inpatient rehab setting.  Physiatrist is providing close team supervision and 24 hour management of active medical problems listed below.  Physiatrist and rehab team continue to assess barriers to discharge/monitor patient progress toward functional and medical goals  Care Tool:  Bathing    Body parts bathed by patient: Chest,Abdomen,Right upper leg,Left upper leg,Right arm,Face,Front perineal area,Buttocks,Left arm,Left lower leg,Right lower leg   Body parts bathed by helper: Right lower leg,Left lower leg     Bathing assist Assist Level: Minimal Assistance - Patient > 75%     Upper Body Dressing/Undressing Upper body dressing   What is the patient wearing?: Pull over shirt    Upper body assist Assist Level: Minimal Assistance - Patient > 75%    Lower Body Dressing/Undressing Lower body dressing      What is the patient wearing?: Pants     Lower body assist Assist for lower body dressing: Moderate Assistance - Patient 50 - 74%     Toileting Toileting    Toileting assist Assist for toileting: Moderate Assistance - Patient 50 - 74%     Transfers Chair/bed transfer  Transfers assist     Chair/bed transfer assist level: Minimal  Assistance - Patient > 75%     Locomotion Ambulation   Ambulation assist      Assist level: Moderate Assistance - Patient 50 - 74% Assistive device: Parallel bars Max distance: 20'   Walk 10 feet activity   Assist     Assist level: Moderate Assistance - Patient - 50 - 74% Assistive device: Parallel bars   Walk 50 feet activity   Assist Walk 50  feet with 2 turns activity did not occur: Safety/medical concerns         Walk 150 feet activity   Assist Walk 150 feet activity did not occur: Safety/medical concerns         Walk 10 feet on uneven surface  activity   Assist Walk 10 feet on uneven surfaces activity did not occur: Safety/medical concerns         Wheelchair     Assist Will patient use wheelchair at discharge?: Yes Type of Wheelchair: Manual Wheelchair activity did not occur: Safety/medical concerns  Wheelchair assist level: Supervision/Verbal cueing Max wheelchair distance: 20'    Wheelchair 50 feet with 2 turns activity    Assist    Wheelchair 50 feet with 2 turns activity did not occur: Safety/medical concerns   Assist Level: Supervision/Verbal cueing   Wheelchair 150 feet activity     Assist  Wheelchair 150 feet activity did not occur: Safety/medical concerns       Blood pressure (!) 102/55, pulse 97, temperature 97.7 F (36.5 C), temperature source Oral, resp. rate 18, height 5\' 5"  (1.651 m), weight 52.5 kg, last menstrual period 11/15/2008, SpO2 98 %.  Medical Problem List and Plan: 1.  L frontal ICH secondary to recent starting of Coumadin- Coumadin on hold with R hemiparesis             Continue CIR  Continue WHO at night.     2.  Portal vein thrombosis/A fib/Antithrombotics: -DVT/anticoagulation:  Pharmaceutical: SCDs due to thrombocytopenia and recent bleed.  --per neuro no coumadin-->will need to wait at least one month due to size of bleed, patient to follow-up as outpatient with neurology  --dopplers ok             -antiplatelet therapy: N/A 3. Pain Management: tylenol prn.   - kpad for neck pain  -will replace robaxin with flexeril prn  - has been using tramadol sparingly: increase to q8H prn as per patient request.  Controlled on 1/1 4. Mood: LCSW to follow up for evaluation and support.              -antipsychotic agents: N/A  -add trazodone qhs prn for  sleep 5. Neuropsych: This patient is capable of making decisions on her own behalf. 6. Wound ulcer/Skin/Wound Care: Pack daily wound care LLE.    -turning, nutrition, education  Scheduled benadryl for itching. Also receiving sarna        -prn vistaril  Eucerin with cortisone in it- will apply BID- see if that helps.   Eucerin and con't eucerin with cortisone.  7. Fluids/Electrolytes/Nutrition: Strict I/Os. 8.  ESRD: Continue HD on TTS at the end of the day to help with therapy tolerance.  9. HTN: Monitor BP   Soft, but asymptomatic on 1/1 10. Pulmonary HTN: Oxygen dependent. Continue Ambrisentan and Selexipag. On Home dose 2L by [place] 11. Cirrhosis of the liver: Monitor platelets and for any signs of bleeding. Reporting abdominal distension.  (protal vein thrombus)  -f/u abd CT as outpt 12. Chronic systolic CHF: Monitor daily  weights as well as I/O. Continue tadalafil.    Filed Weights   01/11/20 0613 01/12/20 0508 01/12/20 1250  Weight: 52.4 kg 52.5 kg 52.5 kg    -volume mgt per nephro  Stable on 1/1 13. CAF: Monitor HR tid--metoprolol has been on hold. Coumadin to be held due to [place].  14. Pancytopenia:  Continue to monitor with serial checks.   Hemoglobin 10.2 on 1/1  Platelets 123 on 1/1  15. Constipation WITH hemorrhoids- seems to cycle between loose stool  -prn miralax only  -added daily fiber packet  -encouraging regular solid/liquid intake as well  -stopped amitiza 12/14  -resumed senna-s at bedtime 12/15  Problems  Continue Anusol  Sitz bath  Tucks as needed, encouraged use 16. Significant epistaxis (pt has prior history)  12/18-Dr. Benjamine Mola cauterized hypervascular areas in nasal septi  Follow-up with Dr. Benjamine Mola outpatient.  Blood tinged discharge from nose- discussed this may be debris from cauterization  Afrin nasal spray  Controlled on 1/1  17.  Sleep disturbance:   Continue 50mg  trazodone qhs prn  Improving 18. Right neck nodule appears resolved. Likely node 20.  Sinus congestion: Continue Sudafed 21. Sore throat: Added lozenges and throat spray prn.  22. Ear fullness: start debrox ear drops BID  LOS: 25 days A FACE TO FACE EVALUATION WAS PERFORMED  Kaylanie Capili Lorie Phenix 01/12/2020, 3:47 PM

## 2020-01-13 LAB — CBC
HCT: 35.9 % — ABNORMAL LOW (ref 36.0–46.0)
Hemoglobin: 11.8 g/dL — ABNORMAL LOW (ref 12.0–15.0)
MCH: 30.6 pg (ref 26.0–34.0)
MCHC: 32.9 g/dL (ref 30.0–36.0)
MCV: 93.2 fL (ref 80.0–100.0)
Platelets: 116 10*3/uL — ABNORMAL LOW (ref 150–400)
RBC: 3.85 MIL/uL — ABNORMAL LOW (ref 3.87–5.11)
RDW: 16.6 % — ABNORMAL HIGH (ref 11.5–15.5)
WBC: 5 10*3/uL (ref 4.0–10.5)
nRBC: 0 % (ref 0.0–0.2)

## 2020-01-13 LAB — BASIC METABOLIC PANEL
Anion gap: 14 (ref 5–15)
BUN: 21 mg/dL (ref 8–23)
CO2: 25 mmol/L (ref 22–32)
Calcium: 9.9 mg/dL (ref 8.9–10.3)
Chloride: 99 mmol/L (ref 98–111)
Creatinine, Ser: 4.29 mg/dL — ABNORMAL HIGH (ref 0.44–1.00)
GFR, Estimated: 11 mL/min — ABNORMAL LOW (ref >60)
Glucose, Bld: 86 mg/dL (ref 70–99)
Potassium: 4.2 mmol/L (ref 3.5–5.1)
Sodium: 138 mmol/L (ref 135–145)

## 2020-01-13 MED ORDER — DEXTROMETHORPHAN POLISTIREX ER 30 MG/5ML PO SUER
30.0000 mg | Freq: Two times a day (BID) | ORAL | Status: DC | PRN
  Administered 2020-01-13 – 2020-01-19 (×4): 30 mg via ORAL
  Filled 2020-01-13 (×6): qty 5

## 2020-01-13 NOTE — Progress Notes (Signed)
[facility] Progress Note   Subjective:   Seen in room, up in wheelchair. No complaints this AM. Denies SOB, CP, palpitations, dizziness at present.   Objective Vitals:   01/12/20 1625 01/12/20 1739 01/12/20 1940 01/13/20 0336  BP: (!) 106/54 (!) 105/53 104/60 116/67  Pulse: 97 100 98 93  Resp: 16 16 16 16   Temp: 98.1 F (36.7 C) 99.2 F (37.3 C) 99.1 F (37.3 C) (!) 97.4 F (36.3 C)  TempSrc: Oral     SpO2: 97% 95% 93% 97%  Weight: 50.5 kg   49 kg  Height:       Physical Exam  General: Frail appearing female, alert and in NAD Heart: RRR, no murmurs, rubs or gallops Lungs: CTA bilaterally without wheezing, rhonchi or rales Abdomen: Soft, non-tender, non-distended Extremities: No edema b/l lower extremities Dialysis Access: LUE AVF + thrill   Additional Objective Labs: Basic Metabolic Panel: Recent Labs  Lab 01/08/20 1433 01/10/20 1416 01/12/20 1329 01/13/20 0528  NA 134* 134* 134* 138  K 3.9 5.1 4.6 4.2  CL 93* 96* 96* 99  CO2 24 22 23 25   GLUCOSE 90 101* 75 86  BUN 62* 60* 52* 21  CREATININE 8.17* 8.03* 7.26* 4.29*  CALCIUM 10.7* 10.2 9.3 9.9  PHOS 5.4* 6.3* 5.5*  --    Liver Function Tests: Recent Labs  Lab 01/08/20 1433 01/10/20 1416 01/12/20 1329  ALBUMIN 3.2* 3.1* 3.0*   CBC: Recent Labs  Lab 01/06/20 1401 01/08/20 1432 01/10/20 1416 01/12/20 1329 01/13/20 0528  WBC 4.8 6.6 5.0 5.6 5.0  HGB 10.4* 11.0* 10.6* 10.2* 11.8*  HCT 31.2* 34.3* 33.1* 31.5* 35.9*  MCV 91.2 91.7 92.2 91.6 93.2  PLT 143* 162 135* 123* 116*   Medications:  . ambrisentan  5 mg Oral Daily  . calcium acetate  1,334 mg Oral TID WC  . camphor-menthol   Topical BID  . carbamide peroxide  5 drop Both EARS BID  . COVID-19 mRNA vaccine (Moderna)  0.25 mL Intramuscular Once  . darbepoetin (ARANESP) injection - DIALYSIS  40 mcg Intravenous Q Thu-HD  . feeding supplement (NEPRO CARB STEADY)  237 mL Oral BID BM  . Gerhardt's butt cream   Topical QID  .  hydrocerin   Topical BID  . hydrocortisone  25 mg Rectal BID  . loratadine  10 mg Oral Daily  . oxymetazoline  1 spray Each Nare BID  . pentoxifylline  400 mg Oral Daily  . psyllium  1 packet Oral BID  . Selexipag  800 mcg Oral BID  . senna-docusate  1 tablet Oral BID  . sodium chloride  1 spray Each Nare 5 X Daily  . tadalafil  20 mg Oral Daily  . triamcinolone 0.1 % cream : eucerin   Topical BID    Dialysis Orders: SW TTS 3.5h 450/600 2/2.25 bath 58.5kg P2 LUA AVF Hep none - Mircera 50 mcg IV q 4 weeks - Hectorol 6 mcg IV TIW - Sensipar 30 mg PO TIW  Assessment/Plan: 1. Intracerebral hemorrhage: Suspected to be related to microvascular disease andwarfarin - now stopped.Repeat CT scan of the head showing no extension of hematoma. Because she suffered significant neurological deficits of the right upper and lower extremities, she was admitted to CIR for intensive PT/OT. 2. ESRD:Continue HD per TTS schedule.No heparin. 3. Anemia:Hgb 10.6 - getting low dose Aranesp q Thurs 4. CKD-MBD:Ca high but improving, Phos ok. PTH 13- stopped VDRA and sensipar. On Phoslo as binder - tells me  tried other binders and had issues swallowing them and not interested in chewable binder. ? Try powder if calcium increases further. Will use low Ca bath with HD as well. 5. Nutrition:Alb low, continue Nepro supplements. Diet liberalized to regular with fluid restrictions.  6. Hypertension: BP stable without edema, she has lost quite a bit of body weight during prolonged hospitalization, will lower on discharge.    Anice Paganini, PA-C 01/13/2020, 12:38 PM  [facility] Pager: [phone]

## 2020-01-13 NOTE — Progress Notes (Addendum)
[place] PHYSICAL MEDICINE & REHABILITATION PROGRESS NOTE   Subjective/Complaints: Patient seen laying in bed this morning.  She states she did not initially sleep well overnight, but slept well thereafter.  She does not recall me.  She notes improvement in her right upper extremity strength as well as ability to wiggle toes in right lower extremity.  ROS: Denies CP, SOB, N/V/D  Objective:   No results found. Recent Labs    01/12/20 1329 01/13/20 0528  WBC 5.6 5.0  HGB 10.2* 11.8*  HCT 31.5* 35.9*  PLT 123* 116*   Recent Labs    01/12/20 1329 01/13/20 0528  NA 134* 138  K 4.6 4.2  CL 96* 99  CO2 23 25  GLUCOSE 75 86  BUN 52* 21  CREATININE 7.26* 4.29*  CALCIUM 9.3 9.9    Intake/Output Summary (Last 24 hours) at 01/13/2020 0902 Last data filed at 01/12/2020 1625 Gross per 24 hour  Intake --  Output 2000 ml  Net -2000 ml     Pressure Injury 12/18/19 Sacrum Medial Stage 1 -  Intact skin with non-blanchable redness of a localized area usually over a bony prominence. Red, non-blanchable spot over sacrum area (Active)  12/18/19 1707  Location: Sacrum  Location Orientation: Medial  Staging: Stage 1 -  Intact skin with non-blanchable redness of a localized area usually over a bony prominence.  Wound Description (Comments): Red, non-blanchable spot over sacrum area  Present on Admission: Yes   Physical Exam: Constitutional: No distress . Vital signs reviewed. HENT: Normocephalic.  Atraumatic. Eyes: EOMI. No discharge. Cardiovascular: No JVD.  RRR. Respiratory: Normal effort.  No stridor.  Bilateral clear to auscultation. GI: Non-distended.  BS +. Skin: Warm and dry.   Left lower extremity dressing CDI. Psych: Normal mood.  Normal behavior. Musc: No edema in extremities.  No tenderness in extremities. Neuro: Alert Motor: LUE/LE: 5/5 proximal distal RUE: Shoulder abduction, elbow flexion/extension 4/5, wrist extension 3+/5, handgrip 3/5  Right lower extremity: 4/5  hip flexion, knee extension, 2+ /5 ankle dorsiflexion Vital Signs Blood pressure 116/67, pulse 93, temperature (!) 97.4 F (36.3 C), resp. rate 16, height 5\' 5"  (1.651 m), weight 49 kg, last menstrual period 11/15/2008, SpO2 97 %.  Assessment/Plan: 1. Functional deficits which require 3+ hours per day of interdisciplinary therapy in a comprehensive inpatient rehab setting.  Physiatrist is providing close team supervision and 24 hour management of active medical problems listed below.  Physiatrist and rehab team continue to assess barriers to discharge/monitor patient progress toward functional and medical goals  Care Tool:  Bathing    Body parts bathed by patient: Chest,Abdomen,Right upper leg,Left upper leg,Right arm,Face,Front perineal area,Buttocks,Left arm,Left lower leg,Right lower leg   Body parts bathed by helper: Right lower leg,Left lower leg     Bathing assist Assist Level: Minimal Assistance - Patient > 75%     Upper Body Dressing/Undressing Upper body dressing   What is the patient wearing?: Pull over shirt    Upper body assist Assist Level: Minimal Assistance - Patient > 75%    Lower Body Dressing/Undressing Lower body dressing      What is the patient wearing?: Pants     Lower body assist Assist for lower body dressing: Moderate Assistance - Patient 50 - 74%     Toileting Toileting    Toileting assist Assist for toileting: Moderate Assistance - Patient 50 - 74%     Transfers Chair/bed transfer  Transfers assist     Chair/bed transfer assist level:  Minimal Assistance - Patient > 75%     Locomotion Ambulation   Ambulation assist      Assist level: Moderate Assistance - Patient 50 - 74% Assistive device: Parallel bars Max distance: 20'   Walk 10 feet activity   Assist     Assist level: Moderate Assistance - Patient - 50 - 74% Assistive device: Parallel bars   Walk 50 feet activity   Assist Walk 50 feet with 2 turns activity did  not occur: Safety/medical concerns         Walk 150 feet activity   Assist Walk 150 feet activity did not occur: Safety/medical concerns         Walk 10 feet on uneven surface  activity   Assist Walk 10 feet on uneven surfaces activity did not occur: Safety/medical concerns         Wheelchair     Assist Will patient use wheelchair at discharge?: Yes Type of Wheelchair: Manual Wheelchair activity did not occur: Safety/medical concerns  Wheelchair assist level: Supervision/Verbal cueing Max wheelchair distance: 20'    Wheelchair 50 feet with 2 turns activity    Assist    Wheelchair 50 feet with 2 turns activity did not occur: Safety/medical concerns   Assist Level: Supervision/Verbal cueing   Wheelchair 150 feet activity     Assist  Wheelchair 150 feet activity did not occur: Safety/medical concerns       Blood pressure 116/67, pulse 93, temperature (!) 97.4 F (36.3 C), resp. rate 16, height 5\' 5"  (1.651 m), weight 49 kg, last menstrual period 11/15/2008, SpO2 97 %.  Medical Problem List and Plan: 1.  L frontal ICH secondary to recent starting of Coumadin- Coumadin on hold with R hemiparesis             Continue CIR  Continue WHO at night.     2.  Portal vein thrombosis/A fib/Antithrombotics: -DVT/anticoagulation:  Pharmaceutical: SCDs due to thrombocytopenia and recent bleed.  --per neuro no coumadin-->will need to wait at least one month due to size of bleed, patient to follow-up as outpatient with neurology  --dopplers ok             -antiplatelet therapy: N/A 3. Pain Management: tylenol prn.   - kpad for neck pain  -will replace robaxin with flexeril prn  - has been using tramadol sparingly: increase to q8H prn as per patient request.  Controlled on 1/2 4. Mood: LCSW to follow up for evaluation and support.              -antipsychotic agents: N/A  -add trazodone qhs prn for sleep 5. Neuropsych: This patient is capable of making  decisions on her own behalf. 6. Wound ulcer/Skin/Wound Care: Pack daily wound care LLE.    -turning, nutrition, education  Scheduled benadryl for itching. Also receiving sarna        -prn vistaril  Eucerin with cortisone in it- will apply BID- see if that helps.   Eucerin and con't eucerin with cortisone.  7. Fluids/Electrolytes/Nutrition: Strict I/Os. 8.  ESRD: Continue HD on TTS at the end of the day to help with therapy tolerance.  9. HTN: Monitor BP   Soft, but asymptomatic on 1/1 10. Pulmonary HTN: Oxygen dependent. Continue Ambrisentan and Selexipag. On Home dose 2L by [place] 11. Cirrhosis of the liver: Monitor platelets and for any signs of bleeding. Reporting abdominal distension.  (protal vein thrombus)  -f/u abd CT as outpt 12. Chronic systolic CHF: Monitor daily weights as  well as I/O. Continue tadalafil.    Filed Weights   01/12/20 1250 01/12/20 1625 01/13/20 0336  Weight: 52.5 kg 50.5 kg 49 kg    -volume mgt per nephro  Stable on 1/2 13. CAF: Monitor HR tid--metoprolol has been on hold. Coumadin to be held due to [place].  14. Pancytopenia:  Continue to monitor with serial checks.   Hemoglobin 11.8 on 1/2  Platelets 116 on 1/2, continue to monitor closely, labs ordered for tomorrow  15. Constipation WITH hemorrhoids- seems to cycle between loose stool  -prn miralax only  -added daily fiber packet  -encouraging regular solid/liquid intake as well  -stopped amitiza 12/14  -resumed senna-s at bedtime 12/15  Problems  Continue Anusol  Sitz bath  Tucks as needed, encouraged use 16. Significant epistaxis (pt has prior history)  12/18-Dr. Benjamine Mola cauterized hypervascular areas in nasal septi  Follow-up with Dr. Benjamine Mola outpatient.  Blood tinged discharge from nose- discussed this may be debris from cauterization  Afrin nasal spray  Controlled on 1/2 17.  Sleep disturbance:   Continue 50mg  trazodone qhs prn  Improving overall 18. Right neck nodule appears resolved. Likely node 20.  Sinus congestion: Continue Sudafed 21. Sore throat: Added lozenges and throat spray prn.  22. Ear fullness: start debrox ear drops BID  LOS: 26 days A FACE TO FACE EVALUATION WAS PERFORMED  [place] Lorie Phenix 01/13/2020, 9:02 AM

## 2020-01-14 ENCOUNTER — Inpatient Hospital Stay (HOSPITAL_COMMUNITY): Payer: Medicare Other | Admitting: Occupational Therapy

## 2020-01-14 ENCOUNTER — Inpatient Hospital Stay (HOSPITAL_COMMUNITY): Payer: Medicare Other

## 2020-01-14 LAB — CBC
HCT: 32.8 % — ABNORMAL LOW (ref 36.0–46.0)
Hemoglobin: 10.4 g/dL — ABNORMAL LOW (ref 12.0–15.0)
MCH: 29.3 pg (ref 26.0–34.0)
MCHC: 31.7 g/dL (ref 30.0–36.0)
MCV: 92.4 fL (ref 80.0–100.0)
Platelets: 149 10*3/uL — ABNORMAL LOW (ref 150–400)
RBC: 3.55 MIL/uL — ABNORMAL LOW (ref 3.87–5.11)
RDW: 16.6 % — ABNORMAL HIGH (ref 11.5–15.5)
WBC: 4.9 10*3/uL (ref 4.0–10.5)
nRBC: 0 % (ref 0.0–0.2)

## 2020-01-14 NOTE — Progress Notes (Signed)
[facility] Progress Note   Subjective:     Seen in room, up in wheelchair.   No complaints this AM.   Getting stronger in RUE and RLE  Objective Vitals:   01/13/20 0336 01/13/20 1601 01/13/20 1931 01/14/20 0524  BP: 116/67 112/60 (!) 112/57 115/67  Pulse: 93 (!) 104 96 94  Resp: 16 17 18 20   Temp: (!) 97.4 F (36.3 C) 99.8 F (37.7 C) 99.5 F (37.5 C) 98.5 F (36.9 C)  TempSrc:  Oral    SpO2: 97% 94% 96% (!) 83%  Weight: 49 kg     Height:       Physical Exam  General: Frail appearing female, alert and in NAD Heart: RRR, no murmurs, rubs or gallops Lungs: CTA bilaterally without wheezing, rhonchi or rales Abdomen: Soft, non-tender, non-distended Extremities: No edema b/l lower extremities Dialysis Access: LUE AVF + thrill   Additional Objective Labs: Basic Metabolic Panel: Recent Labs  Lab 01/08/20 1433 01/10/20 1416 01/12/20 1329 01/13/20 0528 01/14/20 0608  NA 134* 134* 134* 138 133*  K 3.9 5.1 4.6 4.2 4.0  CL 93* 96* 96* 99 96*  CO2 24 22 23 25 24   GLUCOSE 90 101* 75 86 78  BUN 62* 60* 52* 21 43*  CREATININE 8.17* 8.03* 7.26* 4.29* 6.61*  CALCIUM 10.7* 10.2 9.3 9.9 9.4  PHOS 5.4* 6.3* 5.5*  --   --    Liver Function Tests: Recent Labs  Lab 01/08/20 1433 01/10/20 1416 01/12/20 1329  ALBUMIN 3.2* 3.1* 3.0*   CBC: Recent Labs  Lab 01/08/20 1432 01/10/20 1416 01/12/20 1329 01/13/20 0528 01/14/20 0608  WBC 6.6 5.0 5.6 5.0 4.9  HGB 11.0* 10.6* 10.2* 11.8* 10.4*  HCT 34.3* 33.1* 31.5* 35.9* 32.8*  MCV 91.7 92.2 91.6 93.2 92.4  PLT 162 135* 123* 116* 149*   Medications:  . ambrisentan  5 mg Oral Daily  . calcium acetate  1,334 mg Oral TID WC  . camphor-menthol   Topical BID  . carbamide peroxide  5 drop Both EARS BID  . COVID-19 mRNA vaccine (Moderna)  0.25 mL Intramuscular Once  . darbepoetin (ARANESP) injection - DIALYSIS  40 mcg Intravenous Q Thu-HD  . feeding supplement (NEPRO CARB STEADY)  237 mL Oral BID BM  .  hydrocerin   Topical BID  . hydrocortisone  25 mg Rectal BID  . loratadine  10 mg Oral Daily  . pentoxifylline  400 mg Oral Daily  . psyllium  1 packet Oral BID  . Selexipag  800 mcg Oral BID  . senna-docusate  1 tablet Oral BID  . sodium chloride  1 spray Each Nare 5 X Daily  . tadalafil  20 mg Oral Daily    Dialysis Orders: SW TTS 3.5h 450/600 2/2.25 bath 58.5kg P2 LUA AVF Hep none - Mircera 50 mcg IV q 4 weeks - Hectorol 6 mcg IV TIW - Sensipar 30 mg PO TIW  Assessment/Plan: 1. Intracerebral hemorrhage: Suspected to be related to microvascular disease andwarfarin - now stopped.Repeat CT scan of the head showing no extension of hematoma. Because she suffered significant neurological deficits of the right upper and lower extremities, she was admitted to CIR for intensive PT/OT.  Paresis improving 2. ESRD:Continue HD per TTS schedule.No heparin. 2K 3.5h AVF 450/600 Probe down post weights 2L UF 3. Anemia:Hgb at goal - getting low dose Aranesp q Thurs 4. CKD-MBD:Ca improving, Phos ok. PTH 13- stopped VDRA and sensipar. On Phoslo as binder - tells me tried  other binders and had issues swallowing them and not interested in chewable binder. Will use low Ca bath with HD as well. 5. Nutrition:Alb low, continue Nepro supplements. Diet liberalized to regular with fluid restrictions.  6. Hypertension: BP stable without edema, she has lost quite a bit of body weight during prolonged hospitalization, will lower on discharge.  Rexene Agent  01/14/2020, 10:48 AM  [employer]

## 2020-01-14 NOTE — Progress Notes (Signed)
Occupational Therapy Session Note  Patient Details  Name: KAYONA FOOR MRN: [id number] Date of Birth: 08-20-1955  Today's Date: 01/14/2020 OT Individual Time: 0855-1000   &   1100-1158 OT Individual Time Calculation (min): 65 min    &   58 min   Short Term Goals: Week 2:  OT Short Term Goal 1 (Week 2): Pt will don pants over feet with mod A. OT Short Term Goal 1 - Progress (Week 2): Met OT Short Term Goal 2 (Week 2): Pt will stand at toilet with mod A of 1. OT Short Term Goal 2 - Progress (Week 2): Met OT Short Term Goal 3 (Week 2): Patient will reposition R UE prior to transfer with min questioning cues OT Short Term Goal 3 - Progress (Week 2): Met OT Short Term Goal 4 (Week 2): Pt will complete self ROM with min cues OT Short Term Goal 4 - Progress (Week 2): Met Week 3:  OT Short Term Goal 1 (Week 3): LTG=STG 2/2 ELOS Week 4:  OT Short Term Goal 1 (Week 4): LTG=STG 2/2 ELOS  Skilled Therapeutic Interventions/Progress Updates:    Session 1:   Patient seated on toilet with stedy placed in front of her at start of session.  She was unable to void at this time.  Sit to stand at stedy with CGA, max A for hygiene.  Assisted to w.c via stedy.  She completed grooming and dressing tasks w.c level - min A for bra and OH shirt, mod A for incontinence brief/pants, mod/max A for socks/shoes, min A for clothing management in stance, set up for oral care and washing face/applying lotion.  Completed standing weight bearing and motor control activities for inhibition of flexors/stretch hand/wrist/forearm.  She was able to complete modified push up with min A.  She remained seated in w/c at close of session, call bell and tray table in reach.     Session 2:   Patient seated in w/c, ready for second session.  She denies pain and is eager to work in therapy this session.   SPT w/c to/from mat table with min A, cues for weight shift, right knee control and directionality.  Completed NMRE with focus on  posture, trunk control, pelvic mobility/symmetry, scapular mobility, weight shift, weight bearing, motor control right arm and leg in both seated and standing positions, coordination, reaching, manipulation of objects, use of UE as support in variety of positions, throwing, underhand toss...   Good results with repetition.  She remained seated in w/c at close of session, call bell and tray table in reach.      Therapy Documentation Precautions:  Precautions Precautions: Fall Precaution Comments: Rt hemiplegia - subluxation Restrictions Weight Bearing Restrictions: No   Therapy/Group: Individual Therapy  Carlos Levering 01/14/2020, 7:39 AM

## 2020-01-14 NOTE — Progress Notes (Signed)
[place] PHYSICAL MEDICINE & REHABILITATION PROGRESS NOTE   Subjective/Complaints: Had a pretty good night. Feels that metamucil makes her constipated. No nose bleeding  ROS: Patient denies fever, rash, sore throat, blurred vision, nausea, vomiting, diarrhea, cough, shortness of breath or chest pain, joint or back pain, headache, or mood change.    Objective:   No results found. Recent Labs    01/13/20 0528 01/14/20 0608  WBC 5.0 4.9  HGB 11.8* 10.4*  HCT 35.9* 32.8*  PLT 116* 149*   Recent Labs    01/13/20 0528 01/14/20 0608  NA 138 133*  K 4.2 4.0  CL 99 96*  CO2 25 24  GLUCOSE 86 78  BUN 21 43*  CREATININE 4.29* 6.61*  CALCIUM 9.9 9.4    Intake/Output Summary (Last 24 hours) at 01/14/2020 0840 Last data filed at 01/13/2020 1936 Gross per 24 hour  Intake 596 ml  Output --  Net 596 ml     Pressure Injury 12/18/19 Sacrum Medial Stage 1 -  Intact skin with non-blanchable redness of a localized area usually over a bony prominence. Red, non-blanchable spot over sacrum area (Active)  12/18/19 1707  Location: Sacrum  Location Orientation: Medial  Staging: Stage 1 -  Intact skin with non-blanchable redness of a localized area usually over a bony prominence.  Wound Description (Comments): Red, non-blanchable spot over sacrum area  Present on Admission: Yes   Physical Exam: Constitutional: No distress . Vital signs reviewed. HEENT: EOMI, oral membranes moist Neck: supple Cardiovascular: RRR without murmur. No JVD    Respiratory/Chest: CTA Bilaterally without wheezes or rales. Normal effort    GI/Abdomen: BS +, non-tender, non-distended Ext: no clubbing, cyanosis, or edema Psych: pleasant and cooperative Skin: Warm and dry.   Left lower extremity dressing CDI. Musc: No edema in extremities.  No tenderness in extremities. Neuro: Alert Motor: LUE/LE: 5/5 proximal distal RUE: Shoulder abduction, elbow flexion/extension 4/5, wrist extension 3+/5, handgrip 3+/5   Right lower extremity: 4/5 hip flexion, knee extension, 1-2 /5 ankle dorsiflexion/PF   Vital Signs Blood pressure 115/67, pulse 94, temperature 98.5 F (36.9 C), resp. rate 20, height 5\' 5"  (1.651 m), weight 49 kg, last menstrual period 11/15/2008, SpO2 (!) 83 %.  Assessment/Plan: 1. Functional deficits which require 3+ hours per day of interdisciplinary therapy in a comprehensive inpatient rehab setting.  Physiatrist is providing close team supervision and 24 hour management of active medical problems listed below.  Physiatrist and rehab team continue to assess barriers to discharge/monitor patient progress toward functional and medical goals  Care Tool:  Bathing    Body parts bathed by patient: Chest,Abdomen,Right upper leg,Left upper leg,Right arm,Face,Front perineal area,Buttocks,Left arm,Left lower leg,Right lower leg   Body parts bathed by helper: Right lower leg,Left lower leg     Bathing assist Assist Level: Minimal Assistance - Patient > 75%     Upper Body Dressing/Undressing Upper body dressing   What is the patient wearing?: Pull over shirt    Upper body assist Assist Level: Minimal Assistance - Patient > 75%    Lower Body Dressing/Undressing Lower body dressing      What is the patient wearing?: Pants     Lower body assist Assist for lower body dressing: Moderate Assistance - Patient 50 - 74%     Toileting Toileting    Toileting assist Assist for toileting: Moderate Assistance - Patient 50 - 74%     Transfers Chair/bed transfer  Transfers assist     Chair/bed transfer assist level:  Minimal Assistance - Patient > 75%     Locomotion Ambulation   Ambulation assist      Assist level: Moderate Assistance - Patient 50 - 74% Assistive device: Parallel bars Max distance: 20'   Walk 10 feet activity   Assist     Assist level: Moderate Assistance - Patient - 50 - 74% Assistive device: Parallel bars   Walk 50 feet activity   Assist  Walk 50 feet with 2 turns activity did not occur: Safety/medical concerns         Walk 150 feet activity   Assist Walk 150 feet activity did not occur: Safety/medical concerns         Walk 10 feet on uneven surface  activity   Assist Walk 10 feet on uneven surfaces activity did not occur: Safety/medical concerns         Wheelchair     Assist Will patient use wheelchair at discharge?: Yes Type of Wheelchair: Manual Wheelchair activity did not occur: Safety/medical concerns  Wheelchair assist level: Supervision/Verbal cueing Max wheelchair distance: 20'    Wheelchair 50 feet with 2 turns activity    Assist    Wheelchair 50 feet with 2 turns activity did not occur: Safety/medical concerns   Assist Level: Supervision/Verbal cueing   Wheelchair 150 feet activity     Assist  Wheelchair 150 feet activity did not occur: Safety/medical concerns       Blood pressure 115/67, pulse 94, temperature 98.5 F (36.9 C), resp. rate 20, height 5\' 5"  (1.651 m), weight 49 kg, last menstrual period 11/15/2008, SpO2 (!) 83 %.  Medical Problem List and Plan: 1.  L frontal ICH secondary to recent starting of Coumadin- Coumadin on hold with R hemiparesis             Continue CIR  Continue WHO at night.   -has made nice motor gains RUE/RLE    2.  Portal vein thrombosis/A fib/Antithrombotics: -DVT/anticoagulation:  Pharmaceutical: SCDs due to thrombocytopenia and recent bleed.  --per neuro no coumadin-->will need to wait at least one month due to size of bleed, patient to follow-up as outpatient with neurology  --dopplers ok             -antiplatelet therapy: N/A 3. Pain Management: tylenol prn.   - kpad for neck pain  -will replace robaxin with flexeril prn  - tramadol sparingly: q8H prn as per patient request.  Controlled on 1/3 4. Mood: LCSW to follow up for evaluation and support.              -antipsychotic agents: N/A  -added trazodone qhs prn for sleep 5.  Neuropsych: This patient is capable of making decisions on her own behalf. 6. Wound ulcer/Skin/Wound Care: Pack daily wound care LLE.    -turning, nutrition, education  Scheduled benadryl for itching. Also receiving sarna        -prn vistaril  Eucerin with cortisone in it- will apply BID- see if that helps.   Eucerin and con't eucerin with cortisone.  7. Fluids/Electrolytes/Nutrition: Strict I/Os. 8.  ESRD: Continue HD on TTS at the end of the day to help with therapy tolerance.  9. HTN: Monitor BP   Soft, but asymptomatic on 1/1 10. Pulmonary HTN: Oxygen dependent. Continue Ambrisentan and Selexipag. On Home dose 2L by [place] 11. Cirrhosis of the liver: Monitor platelets and for any signs of bleeding. Reporting abdominal distension.  (protal vein thrombus)  -f/u abd CT as outpt 12. Chronic systolic CHF: Monitor daily  weights as well as I/O. Continue tadalafil.    Filed Weights   01/12/20 1250 01/12/20 1625 01/13/20 0336  Weight: 52.5 kg 50.5 kg 49 kg    -volume mgt per nephro  Stable on 1/2, none for 1/3 13. CAF: Monitor HR tid--metoprolol has been on hold. Coumadin to be held due to [place].  14. Pancytopenia:  Continue to monitor with serial checks.   Hemoglobin 11.8 on 1/2-->10.4 1/3  Platelets 116 on 1/2  --. 149 1/3  15. Constipation WITH hemorrhoids- seems to cycle between loose stool  -prn miralax only  -daily fiber packet  -encouraging regular solid/liquid intake as well  --resumed senna-s at bedtime 12/15  =Continue Anusol  Sitz bath  Tucks as needed, encouraged use  1/3 stools are consistently soft to mushy, no changes to regimen despite pt's c/o constipation 16. Significant epistaxis (pt has prior history)  12/18-Dr. Benjamine Mola cauterized hypervascular areas in nasal septi  Follow-up with Dr. Benjamine Mola outpatient.  Blood tinged discharge from nose- discussed this may be debris from cauterization  Afrin nasal spray  Controlled on 1/3 17.  Sleep disturbance:   Continue 50mg  trazodone  qhs prn  Improving overall 18. Right neck nodule appears resolved. Likely node 20. Sinus congestion: Continue Sudafed 21. Sore throat: Added lozenges and throat spray prn.  22. Ear fullness: started debrox ear drops BID  LOS: 27 days A FACE TO FACE EVALUATION WAS PERFORMED  Meredith Staggers 01/14/2020, 8:40 AM

## 2020-01-14 NOTE — Progress Notes (Signed)
Physical Therapy Weekly Progress Note  Patient Details  Name: Katie Nunez MRN: [id number] Date of Birth: 02/17/1955  Beginning of progress report period: January 04, 2020 End of progress report period: January 14, 2020  Today's Date: 01/14/2020 PT Individual Time: 1420-1535 PT Individual Time Calculation (min): 75 min   Patient did not have any STGs due to extending length of stay during previous reporting session. Pt has improved independence with bed mobility, balance, transfers, and ambulation, and continues to slowly regain function of R hemibody. Pt now demos motor activation of lower leg muscle groups including ability to move toes. Pt performing bed mobility and CGA level. Sit to stand consistently with minA. Transfers at [facility] to [facility] level depending on direction and fatigue level. Pt is still requiring modA for ambulation due to R hemiplegia. Focus of coming week to be continued NMR of R hemibody as well as DC prep.  Patient continues to demonstrate the following deficits muscle weakness, decreased cardiorespiratoy endurance, decreased coordination and decreased sitting balance, decreased standing balance, decreased postural control, hemiplegia and decreased balance strategies and therefore will continue to benefit from skilled PT intervention to increase functional independence with mobility.  Patient progressing toward long term goals..  Continue plan of care.  PT Short Term Goals Week 3:  PT Short Term Goal 1 (Week 3): =LTG due to ELOS Week 4:  PT Short Term Goal 1 (Week 4): STGs=LTGs  Skilled Therapeutic Interventions/Progress Updates:  Ambulation/gait training;Cognitive remediation/compensation;Discharge planning;DME/adaptive equipment instruction;Functional mobility training;Pain management;Psychosocial support;Splinting/orthotics;Therapeutic Activities;UE/LE Strength taining/ROM;Visual/perceptual remediation/compensation;Balance/vestibular training;Community  reintegration;Disease management/prevention;Functional electrical stimulation;Neuromuscular re-education;Skin care/wound management;Patient/family education;Stair training;Therapeutic Exercise;UE/LE Coordination activities;Wheelchair propulsion/positioning   Pt received sitting on toilet and agreeable to therapy. Sit to stand with [facility] with supervision and cues for hand placement and body mechanics. Pt is able to perform pericare with setup assistance. Use of [place] for transfer back to [facility]. WC transport to gym for time management  Pt performs multiple reps of sit to stand during session, focusing on hand placement, R lateral weight shift, and body mechanics. Pt performs occasionally with CGA and several reps require minA due to not shifting weight anteriorly enough and having small LOB backward upon standing. Pt ambulates x50' with RW, R hand splint, and minA. PT cues for gait pattern including flexion of R hip during swing phase and control of R knee during stance phase.   Pt performs marching in place for NMR for standing balance and R leg motor control and strength, with focus on hip flexion and hip abductor activation, as well as stability during stance phase. Pt performs with CGA primarily but occasional minA for LOB to the R and posteriorly. Progression to performing toe taps on 3 inch step with cone placed on step and between legs to provide visual and tactile feedback as target for pt to step around to prevent adduction when lifting.   Pt performs 1x10 LAQs with R leg with 3lb ankle weight, able to compete full ROM. Pt then transfers to mat with minA and squat pivot technique to the R. Sit to supine to prone with minA. Pt performs 1x10 AAROM hamstring curls with R leg and is able to complete full ROM several reps without manual assistance. Pt attempts hip extension with flexed R knee for glute isolation but requires heavy assistance to complete ROM, 1x10. Prone to sitting with verbal cues and  increased time. Squat pivot to L with minA to WC. Left seated in WC with all needs within reach.  Therapy Documentation  Precautions:  Precautions Precautions: Fall Precaution Comments: Rt hemiplegia - subluxation Restrictions Weight Bearing Restrictions: No   Therapy/Group: Individual Therapy  Breck Coons, PT, DPT 01/14/2020, 2:25 PM

## 2020-01-15 ENCOUNTER — Inpatient Hospital Stay (HOSPITAL_COMMUNITY): Payer: Medicare Other | Admitting: Occupational Therapy

## 2020-01-15 ENCOUNTER — Inpatient Hospital Stay (HOSPITAL_COMMUNITY): Payer: Medicare Other

## 2020-01-15 LAB — BASIC METABOLIC PANEL
Anion gap: 13 (ref 5–15)
Anion gap: 15 (ref 5–15)
BUN: 43 mg/dL — ABNORMAL HIGH (ref 8–23)
BUN: 66 mg/dL — ABNORMAL HIGH (ref 8–23)
CO2: 24 mmol/L (ref 22–32)
CO2: 22 mmol/L (ref 22–32)
Calcium: 9.4 mg/dL (ref 8.9–10.3)
Calcium: 9.3 mg/dL (ref 8.9–10.3)
Chloride: 96 mmol/L — ABNORMAL LOW (ref 98–111)
Chloride: 95 mmol/L — ABNORMAL LOW (ref 98–111)
Creatinine, Ser: 6.61 mg/dL — ABNORMAL HIGH (ref 0.44–1.00)
Creatinine, Ser: 8.24 mg/dL — ABNORMAL HIGH (ref 0.44–1.00)
GFR, Estimated: 7 mL/min — ABNORMAL LOW (ref >60)
GFR, Estimated: 5 mL/min — ABNORMAL LOW (ref >60)
Glucose, Bld: 78 mg/dL (ref 70–99)
Glucose, Bld: 82 mg/dL (ref 70–99)
Potassium: 4 mmol/L (ref 3.5–5.1)
Potassium: 4.3 mmol/L (ref 3.5–5.1)
Sodium: 133 mmol/L — ABNORMAL LOW (ref 135–145)
Sodium: 132 mmol/L — ABNORMAL LOW (ref 135–145)

## 2020-01-15 LAB — CBC
HCT: 29.7 % — ABNORMAL LOW (ref 36.0–46.0)
Hemoglobin: 10.1 g/dL — ABNORMAL LOW (ref 12.0–15.0)
MCH: 30.7 pg (ref 26.0–34.0)
MCHC: 34 g/dL (ref 30.0–36.0)
MCV: 90.3 fL (ref 80.0–100.0)
Platelets: 156 10*3/uL (ref 150–400)
RBC: 3.29 MIL/uL — ABNORMAL LOW (ref 3.87–5.11)
RDW: 16.2 % — ABNORMAL HIGH (ref 11.5–15.5)
WBC: 4 10*3/uL (ref 4.0–10.5)
nRBC: 0 % (ref 0.0–0.2)

## 2020-01-15 MED ORDER — ACETAMINOPHEN 325 MG PO TABS
ORAL_TABLET | ORAL | Status: AC
  Administered 2020-01-15: 650 mg via ORAL
  Filled 2020-01-15: qty 2

## 2020-01-15 MED ORDER — ATORVASTATIN CALCIUM 40 MG PO TABS
40.0000 mg | ORAL_TABLET | Freq: Every day | ORAL | Status: DC
  Administered 2020-01-15 – 2020-01-23 (×9): 40 mg via ORAL
  Filled 2020-01-15 (×9): qty 1

## 2020-01-15 NOTE — Progress Notes (Signed)
Physical Therapy Session Note  Patient Details  Name: Katie Nunez MRN: [id number] Date of Birth: Jun 04, 1955  Today's Date: 01/15/2020 PT Individual Time: 1115-1200 PT Individual Time Calculation (min): 45 min   Short Term Goals: Week 4:  PT Short Term Goal 1 (Week 4): STGs=LTGs  Skilled Therapeutic Interventions/Progress Updates:     Pt on toilet having bowel movement upon PT entry. Pt misses first 15 minutes of session due to BM. Sit to stand from toilet with minA. Pt performs pericare with setup assistance and stand step transfer to [facility] with minA. Pt self propels WC x80' with Bilateral lower extremities. PT cues for increased inbolvement of R leg due to pt compensating and primarily using L leg.   Pt ambulates x60' with RW and minA. PT cues for upright gaze to improve posture and balance, increasing R hip flexion upon initial swing phase, increasing eccentric knee control during early stance phase, and swing through pattern with L leg to encourage full lateral weight shift to the R during stance phase.  Pt performs sit to stand transfer training for improved efficiency and independence with transfer as well as NMR of bilateral lower extremities and proper sequencing of transfer. Pt completes x5-6 reps of sit to stand with RW, primarily with CGA and occasional minA. Emphasis on hand placement, increased WB through R arm and R leg during transfers, incrased anterior weight translation, and increased glute activation. Pt also performs lateral weight shifting in standing and does not demo any buckling in R knee.   Pt left seated in WC with all needs within reach.  Therapy Documentation Precautions:  Precautions Precautions: Fall Precaution Comments: Rt hemiplegia - subluxation Restrictions Weight Bearing Restrictions: No    Therapy/Group: Individual Therapy  Breck Coons, PT, DPT 01/15/2020, 4:13 PM

## 2020-01-15 NOTE — Procedures (Signed)
I was present at this dialysis session. I have reviewed the session itself and made appropriate changes.   Seen on HD. Tol well. UF 3L.    Now plan to be here throug h1/12.    Filed Weights   01/13/20 0336 01/15/20 0452 01/15/20 1301  Weight: 49 kg 52 kg 52.7 kg    Recent Labs  Lab 01/12/20 1329 01/13/20 0528 01/15/20 0504  NA 134*   < > 132*  K 4.6   < > 4.3  CL 96*   < > 95*  CO2 23   < > 22  GLUCOSE 75   < > 82  BUN 52*   < > 66*  CREATININE 7.26*   < > 8.24*  CALCIUM 9.3   < > 9.3  PHOS 5.5*  --   --    < > = values in this interval not displayed.    Recent Labs  Lab 01/13/20 0528 01/14/20 0608 01/15/20 0504  WBC 5.0 4.9 4.0  HGB 11.8* 10.4* 10.1*  HCT 35.9* 32.8* 29.7*  MCV 93.2 92.4 90.3  PLT 116* 149* 156    Scheduled Meds: . ambrisentan  5 mg Oral Daily  . atorvastatin  40 mg Oral Daily  . calcium acetate  1,334 mg Oral TID WC  . camphor-menthol   Topical BID  . carbamide peroxide  5 drop Both EARS BID  . COVID-19 mRNA vaccine (Moderna)  0.25 mL Intramuscular Once  . darbepoetin (ARANESP) injection - DIALYSIS  40 mcg Intravenous Q Thu-HD  . feeding supplement (NEPRO CARB STEADY)  237 mL Oral BID BM  . hydrocerin   Topical BID  . hydrocortisone  25 mg Rectal BID  . loratadine  10 mg Oral Daily  . pentoxifylline  400 mg Oral Daily  . psyllium  1 packet Oral BID  . Selexipag  800 mcg Oral BID  . senna-docusate  1 tablet Oral BID  . sodium chloride  1 spray Each Nare 5 X Daily  . tadalafil  20 mg Oral Daily   Continuous Infusions: PRN Meds:.acetaminophen, azelastine, bisacodyl, clonazePAM, cyclobenzaprine, dextromethorphan, diphenhydrAMINE, famotidine, gabapentin, hydrocortisone, hydrOXYzine, menthol-cetylpyridinium, milk and molasses, oxymetazoline, phenol, polyethylene glycol, prochlorperazine **OR** prochlorperazine **OR** prochlorperazine, pseudoephedrine, simethicone, traMADol, traZODone, witch hazel-glycerin   Pearson Grippe  MD 01/15/2020, 3:42  PM

## 2020-01-15 NOTE — Progress Notes (Signed)
Occupational Therapy Weekly Progress Note  Patient Details  Name: Katie Nunez MRN: [id number] Date of Birth: [birth year]  Beginning of progress report period: December 19, 2019 End of progress report period: January 15, 2020  Patient is making steady progress towards OT goals. Short term goals not set as pt was supposed to dc this week. However, given patient progress, we are extending her stay in hopes to get her to more CGA level of care prior to dc.Pt has progressed to an overall min A level for BADL tasks and functional transfers. She is getting great return on her R side and is progressing in using R UE in more functional ways. Pt has made such great progress that some of her goals have been upgraded to CGA level.   Patient continues to demonstrate the following deficits: muscle weakness, decreased cardiorespiratoy endurance, impaired timing and sequencing, abnormal tone, unbalanced muscle activation, ataxia, decreased coordination and decreased motor planning and decreased sitting balance, decreased standing balance, decreased postural control, hemiplegia and decreased balance strategies and therefore will continue to benefit from skilled OT intervention to enhance overall performance with BADL, Reduce care partner burden and functional use of RUE.  Patient progressing toward long term goals..  Continue plan of care.  OT Short Term Goals Week 5:   LTG=STG 2.2 ELOS  Skilled Therapeutic Interventions/Progress Updates:      Therapy Documentation Precautions:  Precautions Precautions: Fall Precaution Comments: Rt hemiplegia - subluxation Restrictions Weight Bearing Restrictions: No Pain: Pain Assessment Pain Scale: 0-10 Pain Score: 0-No pain   Therapy/Group: Individual Therapy  Valma Cava 01/15/2020, 11:35 AM

## 2020-01-15 NOTE — Progress Notes (Signed)
Occupational Therapy Session Note  Patient Details  Name: Katie Nunez MRN: [id number] Date of Birth: [birth year]  Today's Date: 01/15/2020 OT Individual Time: 5329-9242 OT Individual Time Calculation (min): 57 min    Short Term Goals: Week 4:  OT Short Term Goal 1 (Week 4): LTG=STG 2/2 ELOS  Skilled Therapeutic Interventions/Progress Updates:    Patient greeted semi-reclined in bed and agreeable to OT treatment session. Pt reported need to go to the bathroom. Pt completed bed mobility with HOB elevated and close supervision. CGA squat-pivot transfer to drop arm wc on stronger L side. Worked on stand-pivot to commode with use of grab bars and min A with verbal cues for R LE positioning. Pt needed OT assist for clothing management. Worked on pt completing peri-care using lateral leans and hip hike. Pt completed stand-pivot off of commode with min A. UB dressing from wc with pt able to thread R UE and pull overhead today! Increased time for LB dressing, but pt able to thread BLEs into pants. Sit<>stand at the sink with CGA and CGA for balance when pulling up pants. Incorporated weight bearing through R UE on counter at the sink with all standing activities. Noted increased tremor in R hand when trying to use R hand functionally.  Pt left seated in wc at end of session with R UE supported on lap tray and needs met.  Therapy Documentation Precautions:  Precautions Precautions: Fall Precaution Comments: Rt hemiplegia - subluxation Restrictions Weight Bearing Restrictions: No Pain: Pain Assessment Pain Scale: 0-10 Pain Score: 0-No pain   Therapy/Group: Individual Therapy  Valma Cava 01/15/2020, 7:55 AM

## 2020-01-15 NOTE — Progress Notes (Addendum)
[place] PHYSICAL MEDICINE & REHABILITATION PROGRESS NOTE   Subjective/Complaints: C/o congestion drainage into back of throat, occasionally blood tinged. Feels that she's made progress but wanted my "prognosis"  ROS: Patient denies fever, rash, sore throat, blurred vision, nausea, vomiting, diarrhea, cough, shortness of breath or chest pain,  headache, or mood change.    Objective:   No results found. Recent Labs    01/14/20 0608 01/15/20 0504  WBC 4.9 4.0  HGB 10.4* 10.1*  HCT 32.8* 29.7*  PLT 149* 156   Recent Labs    01/14/20 0608 01/15/20 0504  NA 133* 132*  K 4.0 4.3  CL 96* 95*  CO2 24 22  GLUCOSE 78 82  BUN 43* 66*  CREATININE 6.61* 8.24*  CALCIUM 9.4 9.3    Intake/Output Summary (Last 24 hours) at 01/15/2020 4034 Last data filed at 01/15/2020 0844 Gross per 24 hour  Intake 600 ml  Output --  Net 600 ml     Pressure Injury 12/18/19 Sacrum Medial Stage 1 -  Intact skin with non-blanchable redness of a localized area usually over a bony prominence. Red, non-blanchable spot over sacrum area (Active)  12/18/19 1707  Location: Sacrum  Location Orientation: Medial  Staging: Stage 1 -  Intact skin with non-blanchable redness of a localized area usually over a bony prominence.  Wound Description (Comments): Red, non-blanchable spot over sacrum area  Present on Admission: Yes   Physical Exam: Constitutional: No distress . Vital signs reviewed. HEENT: EOMI, oral membranes moist Neck: supple Cardiovascular: RRR without murmur. No JVD    Respiratory/Chest: CTA Bilaterally without wheezes or rales. Normal effort    GI/Abdomen: BS +, non-tender, non-distended Ext: no clubbing, cyanosis, or edema Psych: pleasant and cooperative Skin: Warm and dry.   Musc: No edema in extremities.  No tenderness in extremities. Neuro: Alert Motor: LUE/LE: 5/5 proximal distal RUE: Shoulder abduction, elbow flexion/extension 4/5, wrist extension 3+/5, handgrip 3+/5 with tremor  noted Right lower extremity: 4/5 hip flexion, knee extension, 2 /5 ankle dorsiflexion/PF   Vital Signs Blood pressure (!) 105/57, pulse 76, temperature 98.5 F (36.9 C), resp. rate 20, height 5\' 5"  (1.651 m), weight 52 kg, last menstrual period 11/15/2008, SpO2 94 %.  Assessment/Plan: 1. Functional deficits which require 3+ hours per day of interdisciplinary therapy in a comprehensive inpatient rehab setting.  Physiatrist is providing close team supervision and 24 hour management of active medical problems listed below.  Physiatrist and rehab team continue to assess barriers to discharge/monitor patient progress toward functional and medical goals  Care Tool:  Bathing    Body parts bathed by patient: Chest,Abdomen,Right upper leg,Left upper leg,Right arm,Face,Front perineal area,Buttocks,Left arm,Left lower leg,Right lower leg   Body parts bathed by helper: Right lower leg,Left lower leg     Bathing assist Assist Level: Minimal Assistance - Patient > 75%     Upper Body Dressing/Undressing Upper body dressing   What is the patient wearing?: Pull over shirt    Upper body assist Assist Level: Supervision/Verbal cueing    Lower Body Dressing/Undressing Lower body dressing      What is the patient wearing?: Pants     Lower body assist Assist for lower body dressing: Contact Guard/Touching assist     Toileting Toileting    Toileting assist Assist for toileting: Minimal Assistance - Patient > 75%     Transfers Chair/bed transfer  Transfers assist     Chair/bed transfer assist level: Minimal Assistance - Patient > 75%  Locomotion Ambulation   Ambulation assist      Assist level: Minimal Assistance - Patient > 75% Assistive device: Walker-rolling Max distance: 50'   Walk 10 feet activity   Assist     Assist level: Minimal Assistance - Patient > 75% Assistive device: Walker-rolling   Walk 50 feet activity   Assist Walk 50 feet with 2 turns  activity did not occur: Safety/medical concerns  Assist level: Minimal Assistance - Patient > 75% Assistive device: Walker-rolling    Walk 150 feet activity   Assist Walk 150 feet activity did not occur: Safety/medical concerns         Walk 10 feet on uneven surface  activity   Assist Walk 10 feet on uneven surfaces activity did not occur: Safety/medical concerns         Wheelchair     Assist Will patient use wheelchair at discharge?: Yes Type of Wheelchair: Manual Wheelchair activity did not occur: Safety/medical concerns  Wheelchair assist level: Supervision/Verbal cueing Max wheelchair distance: 20'    Wheelchair 50 feet with 2 turns activity    Assist    Wheelchair 50 feet with 2 turns activity did not occur: Safety/medical concerns   Assist Level: Supervision/Verbal cueing   Wheelchair 150 feet activity     Assist  Wheelchair 150 feet activity did not occur: Safety/medical concerns       Blood pressure (!) 105/57, pulse 76, temperature 98.5 F (36.9 C), resp. rate 20, height 5\' 5"  (1.651 m), weight 52 kg, last menstrual period 11/15/2008, SpO2 94 %.  Medical Problem List and Plan: 1.  L frontal ICH secondary to recent starting of Coumadin- Coumadin on hold with R hemiparesis             Continue CIR--team conference today  Continue WHO at night.   -has made nice motor gains RUE/RLE! Reassured her that she's making nice progress.  2.  Portal vein thrombosis/A fib/Antithrombotics: -DVT/anticoagulation:  Pharmaceutical: SCDs due to thrombocytopenia and recent bleed.  --per neuro no coumadin-->will need to wait at least one month due to size of bleed, patient to follow-up as outpatient with neurology  --dopplers ok  -resume statin  per neuro recs             -antiplatelet therapy: N/A 3. Pain Management: tylenol prn.   - kpad for neck pain  -will replace robaxin with flexeril prn  - tramadol sparingly: q8H prn as per patient  request.  Controlled on 1/4 4. Mood: LCSW to follow up for evaluation and support.              -antipsychotic agents: N/A  -added trazodone qhs prn for sleep 5. Neuropsych: This patient is capable of making decisions on her own behalf. 6. Wound ulcer/Skin/Wound Care: Pack daily wound care LLE.    -turning, nutrition, education  -eucerin cream bid  -pruritus generally improved 7. Fluids/Electrolytes/Nutrition: Strict I/Os. 8.  ESRD: Continue HD on TTS at the end of the day to help with therapy tolerance.  9. HTN: Monitor BP   Soft, but asymptomatic on 1/4 10. Pulmonary HTN: Oxygen dependent. Continue Ambrisentan and Selexipag. On Home dose 2L by [place] 11. Cirrhosis of the liver: Monitor platelets and for any signs of bleeding. Reporting abdominal distension.  (protal vein thrombus)  -f/u abd CT as outpt 12. Chronic systolic CHF: Monitor daily weights as well as I/O. Continue tadalafil.    Filed Weights   01/12/20 1625 01/13/20 0336 01/15/20 0452  Weight: 50.5 kg 49  kg 52 kg    -volume mgt per nephro  Stable on 1/2, none for 1/3 13. CAF: Monitor HR tid--metoprolol has been on hold. Coumadin to be held due to [place].  14. Pancytopenia:  Continue to monitor with serial checks.   Hemoglobin 11.8 on 1/2-->10.4 1/3--> 10.1 1/4  Platelets 116 on 1/2  --. 149 1/3---> 156 1/4  15. Constipation WITH hemorrhoids- seems to cycle between loose stool  -prn miralax only  -daily fiber packet  -encouraging regular solid/liquid intake as well  --resumed senna-s at bedtime 12/15  -Continue Anusol bid, sitz bath prn for hems  1/4 stools are generally soft to mushy, no changes to regimen despite pt's c/o constipation 16. Significant epistaxis (pt has prior history)  12/18-Dr. Benjamine Mola cauterized hypervascular areas in nasal septi  Follow-up with Dr. Benjamine Mola outpatient.  -continue saline nasal spray, prn afrin, astelin 17.  Sleep disturbance:   Continue 50mg  trazodone qhs prn  Improving overall 18. Right neck  nodule appears resolved. Likely node 20. Sinus congestion: Continue Sudafed 21. Sore throat: Added lozenges and throat spray prn.  22. Ear fullness:  debrox ear drops BID  LOS: 28 days A FACE TO FACE EVALUATION WAS PERFORMED  Meredith Staggers 01/15/2020, 9:21 AM

## 2020-01-15 NOTE — Progress Notes (Signed)
Patient ID: Katie Nunez, female   DOB: October 13, 1955, 65 y.o.   MRN: [id number] Team Conference Report to Patient/Family  Team Conference discussion was reviewed with the patient and caregiver, including goals, any changes in plan of care and target discharge date.  Patient and caregiver express understanding and are in agreement.  The patient has a target discharge date of 01/23/20.  Dyanne Iha 01/15/2020, 1:03 PM

## 2020-01-15 NOTE — Progress Notes (Signed)
Patient ID: Katie Nunez, female   DOB: March 05, 1955, 65 y.o.   MRN: [id number]   Wheelchair and rolling walker ordered through [employer].  [place], [place]

## 2020-01-15 NOTE — Progress Notes (Signed)
Occupational Therapy Session Note  Patient Details  Name: Katie Nunez MRN: [id number] Date of Birth: 25-Oct-1955  Today's Date: 01/15/2020 OT Individual Time: 0858-1000 OT Individual Time Calculation (min): 62 min    Short Term Goals: Week 2:  OT Short Term Goal 1 (Week 2): Pt will don pants over feet with mod A. OT Short Term Goal 1 - Progress (Week 2): Met OT Short Term Goal 2 (Week 2): Pt will stand at toilet with mod A of 1. OT Short Term Goal 2 - Progress (Week 2): Met OT Short Term Goal 3 (Week 2): Patient will reposition R UE prior to transfer with min questioning cues OT Short Term Goal 3 - Progress (Week 2): Met OT Short Term Goal 4 (Week 2): Pt will complete self ROM with min cues OT Short Term Goal 4 - Progress (Week 2): Met Week 3:  OT Short Term Goal 1 (Week 3): LTG=STG 2/2 ELOS Week 4:  OT Short Term Goal 1 (Week 4): LTG=STG 2/2 ELOS  Skilled Therapeutic Interventions/Progress Updates:    Patient seated in w/c, alert and ready for therapy session.  She denies pain or need for adl tasks at this time.  To therapy gym via w/c.  SPT to/from w/c and mat table with min a and cues for weight shift.  Note increased clonus at wrist to start session.  Completed arm and trunk stretches, inhibition to flexors shoulder to hand t/o session.  NMRE activities in a variety of positions - able to get to hands and knees with min a and min A to maintain at right UE.  Trunk and posture mobility activities, balance and transitional movement with min A.  She was able to achieve full abduction of right UE over head but has difficulty reproducing this movement after change of position and flexor activation.  Returned to w/c at close of session, call bell and tray table in reach.    Therapy Documentation Precautions:  Precautions Precautions: Fall Precaution Comments: Rt hemiplegia - subluxation Restrictions Weight Bearing Restrictions: No  Therapy/Group: Individual Therapy  Carlos Levering 01/15/2020, 7:34 AM

## 2020-01-15 NOTE — Plan of Care (Signed)
  Problem: RH Balance Goal: LTG Patient will maintain dynamic standing with ADLs (OT) Description: LTG:  Patient will maintain dynamic standing balance with assist during activities of daily living (OT)  Flowsheets (Taken 01/15/2020 1346) LTG: Pt will maintain dynamic standing balance during ADLs with: Contact Guard/Touching assist Note: Goal upgraded 1/4 due to patient progress-ESD   Problem: RH Grooming Goal: LTG Patient will perform grooming w/assist,cues/equip (OT) Description: LTG: Patient will perform grooming with assist, with/without cues using equipment (OT) Flowsheets (Taken 01/15/2020 1346) LTG: Pt will perform grooming with assistance level of: Independent with assistive device  Note: Goal upgraded 1/4 due to patient progress-ESD   Problem: RH Bathing Goal: LTG Patient will bathe all body parts with assist levels (OT) Description: LTG: Patient will bathe all body parts with assist levels (OT) Flowsheets (Taken 01/15/2020 1346) LTG: Pt will perform bathing with assistance level/cueing: Contact Guard/Touching assist Note: Goal upgraded 1/4 due to patient progress-ESD   Problem: RH Dressing Goal: LTG Patient will perform lower body dressing w/assist (OT) Description: LTG: Patient will perform lower body dressing with assist, with/without cues in positioning using equipment (OT) Flowsheets (Taken 01/15/2020 1346) LTG: Pt will perform lower body dressing with assistance level of: Contact Guard/Touching assist Note: Goal upgraded 1/4 due to patient progress-ESD   Problem: RH Toileting Goal: LTG Patient will perform toileting task (3/3 steps) with assistance level (OT) Description: LTG: Patient will perform toileting task (3/3 steps) with assistance level (OT)  Flowsheets (Taken 01/15/2020 1346) LTG: Pt will perform toileting task (3/3 steps) with assistance level: Contact Guard/Touching assist Note: Goal upgraded 1/4 due to patient progress-ESD   Problem: RH Toilet Transfers Goal: LTG  Patient will perform toilet transfers w/assist (OT) Description: LTG: Patient will perform toilet transfers with assist, with/without cues using equipment (OT) Flowsheets (Taken 01/15/2020 1346) LTG: Pt will perform toilet transfers with assistance level of: Contact Guard/Touching assist Note: Goal upgraded 1/4 due to patient progress-ESD   Problem: RH Tub/Shower Transfers Goal: LTG Patient will perform tub/shower transfers w/assist (OT) Description: LTG: Patient will perform tub/shower transfers with assist, with/without cues using equipment (OT) Flowsheets (Taken 01/15/2020 1346) LTG: Pt will perform tub/shower stall transfers with assistance level of: Contact Guard/Touching assist Note: Goal upgraded 1/4 due to patient progress-ESD

## 2020-01-16 ENCOUNTER — Inpatient Hospital Stay (HOSPITAL_COMMUNITY): Payer: Medicare Other

## 2020-01-16 LAB — BASIC METABOLIC PANEL
Anion gap: 13 (ref 5–15)
BUN: 33 mg/dL — ABNORMAL HIGH (ref 8–23)
CO2: 25 mmol/L (ref 22–32)
Calcium: 9 mg/dL (ref 8.9–10.3)
Chloride: 98 mmol/L (ref 98–111)
Creatinine, Ser: 4.49 mg/dL — ABNORMAL HIGH (ref 0.44–1.00)
GFR, Estimated: 11 mL/min — ABNORMAL LOW (ref >60)
Glucose, Bld: 81 mg/dL (ref 70–99)
Potassium: 3.7 mmol/L (ref 3.5–5.1)
Sodium: 136 mmol/L (ref 135–145)

## 2020-01-16 LAB — CBC
HCT: 32.3 % — ABNORMAL LOW (ref 36.0–46.0)
Hemoglobin: 10.7 g/dL — ABNORMAL LOW (ref 12.0–15.0)
MCH: 30.1 pg (ref 26.0–34.0)
MCHC: 33.1 g/dL (ref 30.0–36.0)
MCV: 91 fL (ref 80.0–100.0)
Platelets: 126 10*3/uL — ABNORMAL LOW (ref 150–400)
RBC: 3.55 MIL/uL — ABNORMAL LOW (ref 3.87–5.11)
RDW: 16.5 % — ABNORMAL HIGH (ref 11.5–15.5)
WBC: 3.4 10*3/uL — ABNORMAL LOW (ref 4.0–10.5)
nRBC: 0 % (ref 0.0–0.2)

## 2020-01-16 MED ORDER — RENA-VITE PO TABS
1.0000 | ORAL_TABLET | Freq: Every day | ORAL | Status: DC
  Filled 2020-01-16: qty 1

## 2020-01-16 MED ORDER — PROSOURCE PLUS PO LIQD
30.0000 mL | Freq: Two times a day (BID) | ORAL | Status: DC
  Administered 2020-01-16 – 2020-01-22 (×7): 30 mL via ORAL
  Filled 2020-01-16 (×11): qty 30

## 2020-01-16 NOTE — Progress Notes (Signed)
Initial Nutrition Assessment  DOCUMENTATION CODES:   Underweight,Severe malnutrition in context of chronic illness  INTERVENTION:   - Continue Nepro Shake po BID, each supplement provides 425 kcal and 19 grams protein  - Renal MVI daily  - ProSource Plus 30 ml po BID, each supplement provides 100 kcal and 15 grams of protein  - Agree with regular diet order  - Provided diet education and "High Calorie, High Protein Nutrition Therapy" handout from the Academy of [employer] and Dietetics  - Encourage adequate PO intake  NUTRITION DIAGNOSIS:   Severe Malnutrition related to chronic illness (ESRD on HD, cirrhosis) as evidenced by severe muscle depletion,severe fat depletion,percent weight loss (30.5% weight loss in less than 1 year).  GOAL:   Patient will meet greater than or equal to 90% of their needs  MONITOR:   PO intake,Supplement acceptance,Labs,Weight trends,Skin,I & O's  REASON FOR ASSESSMENT:   Other (underweight BMI)    ASSESSMENT:   65 year old female with PMH of HTN, ESRD/HD, Raynaud's, achalasia, cirrhosis, PAH, atrial fibrillation. Pt was admitted on 12/12/19 with onset of LUE/LLE weakness and found to have right frontal ICH with cerebral edema. Admitted to CIR on 12/07.   12/18 - s/p bilateral endoscopic control of nasal hemorrhage with cauterization  Noted target d/c date of 01/23/20.  Last HD on 1/04 with 2486 ml net UF. Post HD weight was 48.7 kg.  Spoke with pt at bedside. Pt reports appetite has been okay and that for the most part, she is eating well. She has been trying to drink the Nepro supplements but reports that it is a lot to eat a meal, drink the Nepro, and take the psyllium. Pt willing to try ProSource Plus as a quick way to get some additional protein. Pt reports that she prefers the Nepro supplements chilled and that they are normally brought to her this way but that this was not the case with her most recent one. RD provided pt with a new  chilled Nepro supplement.  Pt endorses significant weight loss despite trying to eat well at meals. Pt reports her UBW as 150 lbs. Reviewed weight history in chart. Pt has experienced a 21.4 kg weight loss since 02/06/19. This is a 30.5% weight loss in less than 1 year which is severe and significant for timeframe. Pt meets criteria for severe malnutrition.  Provided pt with "High Calorie, High Protein Nutrition Therapy" handout from the Academy of [employer] and Dietetics. Discussed ways to add protein and kcal to pt's meals and snacks without necessarily adding volume. Pt appreciative of information.  Meal Completion: 25-100%  Medications reviewed and include: phoslo TID, aranesp, Nepro Shake BID, psyllium, senna  Labs reviewed.  NUTRITION - FOCUSED PHYSICAL EXAM:  Flowsheet Row Most Recent Value  Orbital Region Severe depletion  Upper Arm Region Moderate depletion  Thoracic and Lumbar Region Severe depletion  Buccal Region Severe depletion  Temple Region Moderate depletion  Clavicle Bone Region Severe depletion  Clavicle and Acromion Bone Region Severe depletion  Scapular Bone Region Severe depletion  Dorsal Hand Moderate depletion  Patellar Region Moderate depletion  Anterior Thigh Region Severe depletion  Posterior Calf Region Severe depletion  Edema (RD Assessment) None  Hair Reviewed  Eyes Reviewed  Mouth Reviewed  Skin Reviewed  Nails Reviewed       Diet Order:   Diet Order            Diet regular Room service appropriate? Yes; Fluid consistency: Thin; Fluid restriction: 1200 mL Fluid  Diet effective now                 EDUCATION NEEDS:   Education needs have been addressed  Skin:  Skin Assessment: Skin Integrity Issues: Stage I: sacrum  Last BM:  01/15/20  Height:   Ht Readings from Last 1 Encounters:  12/18/19 5\' 5"  (1.651 m)    Weight:   Wt Readings from Last 1 Encounters:  01/16/20 48.7 kg    BMI:  Body mass index is 17.87  kg/m.  Estimated Nutritional Needs:   Kcal:  1600-1800  Protein:  75-90 grams  Fluid:  1000 ml + UOP    Gustavus Carino, MS, RD, LDN Inpatient Clinical Dietitian Please see [facility] for contact information.

## 2020-01-16 NOTE — Progress Notes (Signed)
Physical Therapy Session Note  Patient Details  Name: Katie Nunez MRN: [id number] Date of Birth: July 06, 1955  Today's Date: 01/16/2020 PT Individual Time: [phone] and 1417-1530 PT Individual Time Calculation (min): 40 min and 73 min   Short Term Goals: Week 4:  PT Short Term Goal 1 (Week 4): STGs=LTGs  Skilled Therapeutic Interventions/Progress Updates:     1st Session: Pt received seated in WC and agrees to therapy. WC transport to gym for time management. Stand pivot transfer to mat table with RW and minA, with cues for body mechanics, hand placement, and positioning. Pt performs block practice of sit to stand, initially with RW and CGA/minA, then progressing to no AD and placing L hand over R on R distal thigh to promote R lateral weight shift and WB during transfer.   Sit to supine with cues on sequencing. Pt performs 1x10 supine bridge, 2x10 bridges with level 1 therband around distal thigh for hip abductor activation, and 2x10 unilateral bridges with L leg crossed over R, performing a x20 second hold on final rep. Pt performs 2x10 clamshells in hooklying with level 1 theraband.  Supine to sit with supervision and cues on sequencing. Stand pivot to WC with minA. Pt left seated in WC with alarm intact and all needs within reach.   2nd Session:  Pt received seated in [facility] and agrees to therapy. No complaint of pain. WC transport to gym for time management. Pt performs multiple reps of sit to stand during session, requiring CGA to minA depending on body mechanics and fatigue. Pt ambulates 29' with minA and RW with R hand splint. PT cues for increased lateral weight shift to the R, increased stride length with L, increased eccentric control of knee flexion on R in early stance phase, and maintaining upright gaze to improve posture and balance.   Pt ambulates x40' with RW and 4lb ankle weight on L leg to promote R lateral weight shift in order to clear L leg. Pt demos improved stability at  R knee but complains that it "feels like it's about to pop." Pt then ambulates x40' with 3lb ankle weight on R leg to provide proprioceptive input.   Pt performs NMR in standing for R leg motor return and WB, with mirror positioned for visual feedback. Pt stands with PT providing tactile facilitation of R knee stability and quad activation. Pt performs L hip abduction 1x10, single leg stance on R 2x30 seconds. Pt also performs hip abduction on R with 3lb ankle weight 1x10. In parallel bars pt performs single legs stance on R with partial WB through L propped on high platform, alternating upper extremity support, and practicing without any UE support, with PT providing modA at hips for balance and neutral positioning. Intended to encourage R WB and stability through R hemibody.  Pt left seated in WC with all needs within reach.  Therapy Documentation Precautions:  Precautions Precautions: Fall Precaution Comments: Rt hemiplegia - subluxation Restrictions Weight Bearing Restrictions: No    Therapy/Group: Individual Therapy  Breck Coons, PT, DPT 01/16/2020, 3:38 PM

## 2020-01-16 NOTE — Progress Notes (Signed)
[place] PHYSICAL MEDICINE & REHABILITATION PROGRESS NOTE   Subjective/Complaints: Felt very sick after dialysis yesterday- she had cramps afterward and felt too much fluid was taken off. She ate some mustard and feels better now. She asks why she was started on Lipitor.  WBC 3.4 today.   ROS: Patient denies fever, rash, sore throat, blurred vision, nausea, vomiting, diarrhea, cough, shortness of breath or chest pain,  headache, or mood change.    Objective:   No results found. Recent Labs    01/15/20 0504 01/16/20 0550  WBC 4.0 3.4*  HGB 10.1* 10.7*  HCT 29.7* 32.3*  PLT 156 126*   Recent Labs    01/15/20 0504 01/16/20 0550  NA 132* 136  K 4.3 3.7  CL 95* 98  CO2 22 25  GLUCOSE 82 81  BUN 66* 33*  CREATININE 8.24* 4.49*  CALCIUM 9.3 9.0    Intake/Output Summary (Last 24 hours) at 01/16/2020 0828 Last data filed at 01/16/2020 0804 Gross per 24 hour  Intake 600 ml  Output 2486 ml  Net -1886 ml     Pressure Injury 12/18/19 Sacrum Medial Stage 1 -  Intact skin with non-blanchable redness of a localized area usually over a bony prominence. Red, non-blanchable spot over sacrum area (Active)  12/18/19 1707  Location: Sacrum  Location Orientation: Medial  Staging: Stage 1 -  Intact skin with non-blanchable redness of a localized area usually over a bony prominence.  Wound Description (Comments): Red, non-blanchable spot over sacrum area  Present on Admission: Yes   Physical Exam: Gen: no distress, normal appearing HEENT: oral mucosa pink and moist, NCAT Cardio: Reg rate Chest: normal effort, normal rate of breathing Abd: soft, non-distended Ext: no clubbing, cyanosis, or edema Psych: pleasant and cooperative Skin: Warm and dry.   Musc: No edema in extremities.  No tenderness in extremities. Neuro: Alert Motor: LUE/LE: 5/5 proximal distal RUE: Shoulder abduction, elbow flexion/extension 4/5, wrist extension 3+/5, handgrip 3+/5 with tremor noted Right lower  extremity: 4/5 hip flexion, knee extension, 2 /5 ankle dorsiflexion/PF   Vital Signs Blood pressure 103/63, pulse 90, temperature 98.6 F (37 C), temperature source Oral, resp. rate 18, height 5\' 5"  (1.651 m), weight 48.7 kg, last menstrual period 11/15/2008, SpO2 (!) 84 %.  Assessment/Plan: 1. Functional deficits which require 3+ hours per day of interdisciplinary therapy in a comprehensive inpatient rehab setting.  Physiatrist is providing close team supervision and 24 hour management of active medical problems listed below.  Physiatrist and rehab team continue to assess barriers to discharge/monitor patient progress toward functional and medical goals  Care Tool:  Bathing    Body parts bathed by patient: Chest,Abdomen,Right upper leg,Left upper leg,Right arm,Face,Front perineal area,Buttocks,Left arm,Left lower leg,Right lower leg   Body parts bathed by helper: Right lower leg,Left lower leg     Bathing assist Assist Level: Minimal Assistance - Patient > 75%     Upper Body Dressing/Undressing Upper body dressing   What is the patient wearing?: Pull over shirt    Upper body assist Assist Level: Supervision/Verbal cueing    Lower Body Dressing/Undressing Lower body dressing      What is the patient wearing?: Pants     Lower body assist Assist for lower body dressing: Contact Guard/Touching assist     Toileting Toileting    Toileting assist Assist for toileting: Minimal Assistance - Patient > 75%     Transfers Chair/bed transfer  Transfers assist     Chair/bed transfer assist level: Minimal  Assistance - Patient > 75%     Locomotion Ambulation   Ambulation assist      Assist level: Minimal Assistance - Patient > 75% Assistive device: Walker-rolling Max distance: 60'   Walk 10 feet activity   Assist     Assist level: Minimal Assistance - Patient > 75% Assistive device: Walker-rolling   Walk 50 feet activity   Assist Walk 50 feet with 2  turns activity did not occur: Safety/medical concerns  Assist level: Minimal Assistance - Patient > 75% Assistive device: Walker-rolling    Walk 150 feet activity   Assist Walk 150 feet activity did not occur: Safety/medical concerns         Walk 10 feet on uneven surface  activity   Assist Walk 10 feet on uneven surfaces activity did not occur: Safety/medical concerns         Wheelchair     Assist Will patient use wheelchair at discharge?: Yes Type of Wheelchair: Manual Wheelchair activity did not occur: Safety/medical concerns  Wheelchair assist level: Supervision/Verbal cueing Max wheelchair distance: 20'    Wheelchair 50 feet with 2 turns activity    Assist    Wheelchair 50 feet with 2 turns activity did not occur: Safety/medical concerns   Assist Level: Supervision/Verbal cueing   Wheelchair 150 feet activity     Assist  Wheelchair 150 feet activity did not occur: Safety/medical concerns       Blood pressure 103/63, pulse 90, temperature 98.6 F (37 C), temperature source Oral, resp. rate 18, height 5\' 5"  (1.651 m), weight 48.7 kg, last menstrual period 11/15/2008, SpO2 (!) 84 %.  Medical Problem List and Plan: 1.  L frontal ICH secondary to recent starting of Coumadin- Coumadin on hold with R hemiparesis             Continue CIR  Continue WHO at night.   -has made nice motor gains RUE/RLE! Reassured her that she's making nice progress.  2.  Portal vein thrombosis/A fib/Antithrombotics: -DVT/anticoagulation:  Pharmaceutical: SCDs due to thrombocytopenia and recent bleed.  --per neuro no coumadin-->will need to wait at least one month due to size of bleed, patient to follow-up as outpatient with neurology  --dopplers ok  -resume statin  per neuro recs, explained rationale for lipitor to patient. Discussed potential side effects.              -antiplatelet therapy: N/A 3. Pain Management: tylenol prn.   - kpad for neck pain  -will replace  robaxin with flexeril prn  - tramadol sparingly: q8H prn as per patient request. Prefers to keep at q8- continue at this time.   Controlled on 1/5 4. Mood: LCSW to follow up for evaluation and support.              -antipsychotic agents: N/A  -added trazodone qhs prn for sleep 5. Neuropsych: This patient is capable of making decisions on her own behalf. 6. Wound ulcer/Skin/Wound Care: Pack daily wound care LLE.    -turning, nutrition, education  -eucerin cream bid  -pruritus generally improved 7. Fluids/Electrolytes/Nutrition: Strict I/Os. 8.  ESRD: Continue HD on TTS at the end of the day to help with therapy tolerance.  9. HTN: Monitor BP   Soft, but asymptomatic on 1/5- continue to monitor.  10. Pulmonary HTN: Oxygen dependent. Continue Ambrisentan and Selexipag. On Home dose 2L by [place] 11. Cirrhosis of the liver: Monitor platelets and for any signs of bleeding. Reporting abdominal distension.  (protal vein thrombus)  -f/u  abd CT as outpt 12. Chronic systolic CHF: Monitor daily weights as well as I/O. Continue tadalafil.    Filed Weights   01/15/20 0452 01/15/20 1301 01/16/20 0500  Weight: 52 kg 52.7 kg 48.7 kg    -volume mgt per nephro  Decreased 1/5: continue to monitor daily 13. CAF: Monitor HR tid--metoprolol has been on hold. Coumadin to be held due to [place].  14. Pancytopenia:  Continue to monitor with serial checks.   Hemoglobin 11.8 on 1/2-->10.4 1/3--> 10.1 1/4  Platelets 116 on 1/2  --. 149 1/3---> 156 1/4  15. Constipation WITH hemorrhoids- seems to cycle between loose stool  -prn miralax only  -daily fiber packet  -encouraging regular solid/liquid intake as well  --resumed senna-s at bedtime 12/15  -Continue Anusol bid, sitz bath prn for hems  1/4 stools are generally soft to mushy, no changes to regimen despite pt's c/o constipation 16. Significant epistaxis (pt has prior history)  12/18-Dr. Benjamine Mola cauterized hypervascular areas in nasal septi  Follow-up with Dr. Benjamine Mola  outpatient.  -continue saline nasal spray, prn afrin, astelin 17.  Sleep disturbance:   Continue 50mg  trazodone qhs prn  Improving overall 18. Right neck nodule appears resolved. Likely node 20. Sinus congestion: Continue Sudafed 21. Sore throat: Added lozenges and throat spray prn.  22. Ear fullness:  debrox ear drops BID  LOS: 29 days A FACE TO FACE EVALUATION WAS PERFORMED  Korinne Greenstein P Caidence Kaseman 01/16/2020, 8:28 AM

## 2020-01-16 NOTE — Progress Notes (Signed)
[facility] Progress Note   Subjective:     Working with PT  2.5L UF yesterday, 49kg post? Was unwell after  Objective Vitals:   01/15/20 2031 01/15/20 2310 01/16/20 0453 01/16/20 0500  BP: 94/62 107/67 103/63   Pulse: 88 93 90   Resp:   18   Temp:   98.6 F (37 C)   TempSrc:   Oral   SpO2:   (!) 84%   Weight:    48.7 kg  Height:       Physical Exam  General:  alert and in NAD Heart: RRR, no murmurs, rubs or gallops Lungs: CTA bilaterally without wheezing, rhonchi or rales Abdomen: Soft, non-tender, non-distended Extremities: No edema b/l lower extremities Dialysis Access: LUE AVF + thrill   Additional Objective Labs: Basic Metabolic Panel: Recent Labs  Lab 01/10/20 1416 01/12/20 1329 01/13/20 0528 01/14/20 0608 01/15/20 0504 01/16/20 0550  NA 134* 134*   < > 133* 132* 136  K 5.1 4.6   < > 4.0 4.3 3.7  CL 96* 96*   < > 96* 95* 98  CO2 22 23   < > 24 22 25   GLUCOSE 101* 75   < > 78 82 81  BUN 60* 52*   < > 43* 66* 33*  CREATININE 8.03* 7.26*   < > 6.61* 8.24* 4.49*  CALCIUM 10.2 9.3   < > 9.4 9.3 9.0  PHOS 6.3* 5.5*  --   --   --   --    < > = values in this interval not displayed.   Liver Function Tests: Recent Labs  Lab 01/10/20 1416 01/12/20 1329  ALBUMIN 3.1* 3.0*   CBC: Recent Labs  Lab 01/12/20 1329 01/13/20 0528 01/14/20 0608 01/15/20 0504 01/16/20 0550  WBC 5.6 5.0 4.9 4.0 3.4*  HGB 10.2* 11.8* 10.4* 10.1* 10.7*  HCT 31.5* 35.9* 32.8* 29.7* 32.3*  MCV 91.6 93.2 92.4 90.3 91.0  PLT 123* 116* 149* 156 126*   Medications:  . ambrisentan  5 mg Oral Daily  . atorvastatin  40 mg Oral Daily  . calcium acetate  1,334 mg Oral TID WC  . camphor-menthol   Topical BID  . carbamide peroxide  5 drop Both EARS BID  . COVID-19 mRNA vaccine (Moderna)  0.25 mL Intramuscular Once  . darbepoetin (ARANESP) injection - DIALYSIS  40 mcg Intravenous Q Thu-HD  . feeding supplement (NEPRO CARB STEADY)  237 mL Oral BID BM  .  hydrocerin   Topical BID  . hydrocortisone  25 mg Rectal BID  . loratadine  10 mg Oral Daily  . pentoxifylline  400 mg Oral Daily  . psyllium  1 packet Oral BID  . Selexipag  800 mcg Oral BID  . senna-docusate  1 tablet Oral BID  . sodium chloride  1 spray Each Nare 5 X Daily  . tadalafil  20 mg Oral Daily    Dialysis Orders: SW TTS 3.5h 450/600 2/2.25 bath 58.5kg P2 LUA AVF Hep none - Mircera 50 mcg IV q 4 weeks - Hectorol 6 mcg IV TIW - Sensipar 30 mg PO TIW  Assessment/Plan: 1. Intracerebral hemorrhage: Suspected to be related to microvascular disease andwarfarin - now stopped.Repeat CT scan of the head showing no extension of hematoma. Because she suffered significant neurological deficits of the right upper and lower extremities, she was admitted to CIR for intensive PT/OT.  Paresis improving 2. ESRD:Continue HD per TTS schedule.No heparin. 2K 3.5h AVF 450/600 UF to 49.5kg  standing weight 3. Anemia:Hgb at goal - getting low dose Aranesp q Thurs 4. CKD-MBD:Ca improving, Phos ok. PTH 13- stopped VDRA and sensipar. On Phoslo as binder - tried other binders and had issues swallowing them and not interested in chewable binder. Will use low Ca bath with HD as well. 5. Nutrition:Alb low, continue Nepro supplements. Diet liberalized to regular with fluid restrictions.  6. Hypertension: BP stable without edema, she has lost quite a bit of body weight during prolonged hospitalization, will lower on discharge.  Rexene Agent  01/16/2020, 10:17 AM  [employer]

## 2020-01-16 NOTE — Patient Care Conference (Signed)
Inpatient RehabilitationTeam Conference and Plan of Care Update Date: 01/16/2020   Time: 10:39 AM    Patient Name: Katie Nunez      Medical Record Number: [id number]  Date of Birth: 08/06/1955 Sex: Female         Room/Bed: 4M03C/4M03C-01 Payor Info: Payor: [employer] park [employer] MEDICARE / Plan: UHC MEDICARE / Product Type: *No Product type* /    Admit Date/Time:  12/18/2019  4:05 PM  Primary Diagnosis:  Intraparenchymal hemorrhage of brain [facility])  Hospital Problems: Principal Problem:   Intraparenchymal hemorrhage of brain ([place]) Active Problems:   Pressure injury of skin   Epistaxis   Sleep disturbance   Slow transit constipation   Hemorrhoids   Chronic systolic congestive heart failure ([place])   ESRD on dialysis [facility])   Right hemiparesis [facility])    Expected Discharge Date: Expected Discharge Date: 01/23/20  Team Members Present: Physician leading conference: Dr. Alger Simons Care Coodinator Present: Dorien Chihuahua, RN, BSN, CRRN;Christina Sampson Goon, BSW Nurse Present: Suella Grove, RN PT Present: Tereasa Coop, PT OT Present: Cherylynn Ridges, OT SLP Present: Weston Anna, SLP PPS Coordinator present : Ileana Ladd, Burna Mortimer, SLP     Current Status/Progress Goal Weekly Team Focus  Bowel/Bladder   Patient is contintient of Bowel and Bladder, LBM 01/14/20,, Has issues with her Hemmoroids schedule and PRN medications available  remain continent of bowel  Assess toileting needs QS/PRN   Swallow/Nutrition/ Hydration             ADL's   funcitonal transfer min A, UB adl min A, LB adl mod A, improving motor control t/o right  min/CGA  adl training, R NMRE, transfer training, balance, family education   Mobility   CGA/minA bed mobility, minA stand pivot transfers, minA gait up to 72' with RW  supervision bed level, minA transfers and ambulation household distances. May upgrade goals  R hemibody NMR, consistency with transfers, safety with ambulation, DC prep    Communication             Safety/Cognition/ Behavioral Observations            Pain   Pain score rating 6-8/10 mainly hemorroid and LE's  Patient states she does not have a specific goal just to her tolerance  Assess QS/PRN provide schedule and unschedule medications at patient request, monitor relief factors   Skin   Skin intact, skin very dry with itchiness, has schedule and prn medications and creams  remain free of skin breakdown and infection  Assess Skin QS/PRN     Discharge Planning:  D/c to home with support from husband and PRN support from son. Waiting on updates from [facility] on if referral can be accepted. Family education completed on 12/22 and 12/27 with pt husband.   Team Discussion: No problems with nose bleeds since cauterized. HD ongoing MD to address restart coumadin with neurology. Patient with newly noted tremors; although has good return in legs and is doing better now that she is more stable and has motor return.  Patient on target to meet rehab goals: yes, currently min-mod assist with mobility. Min assist for ADLs  *See Care Plan and progress notes for long and short-term goals.   Revisions to Treatment Plan:  Discussion of request by team to extend LOS to upgrade goals and address motor planning and coordination issues Teaching Needs: Transfers, toileting, medications, etc  Current Barriers to Discharge: Decreased caregiver support and Hemodialysis  Possible Resolutions to Barriers: Family education DME ordered  and follow up PT/OT services     Medical Summary Current Status: improving right hemiparesis, bowels are moving regularly. no further nose bleeds. ESRD on HD  Barriers to Discharge: Medical stability   Possible Resolutions to [employer] Focus: daily lab and data review. finalize med plan for discharge   Continued Need for Acute Rehabilitation Level of Care: The patient requires daily medical management by a physician with  specialized training in physical medicine and rehabilitation for the following reasons: Direction of a multidisciplinary physical rehabilitation program to maximize functional independence : Yes Medical management of patient stability for increased activity during participation in an intensive rehabilitation regime.: Yes Analysis of laboratory values and/or radiology reports with any subsequent need for medication adjustment and/or medical intervention. : Yes   I attest that I was present, lead the team conference, and concur with the assessment and plan of the team.   Dorien Chihuahua B 01/16/2020, 7:30 AM

## 2020-01-16 NOTE — Progress Notes (Signed)
Occupational Therapy Session Note  Patient Details  Name: Katie Nunez MRN: [id number] Date of Birth: Aug 04, 1955  Today's Date: 01/16/2020 OT Individual Time: 1040-1140 OT Individual Time Calculation (min): 60 min    Short Term Goals: Week 5:  OT Short Term Goal 1 (Week 5): LTG=STG 2.2 ELOS  Skilled Therapeutic Interventions/Progress Updates:    Pt received in w/c with no c/o pain, agreeable to OT. Pt was taken via w/c to the therapy gym. Session began with focus on dynamic standing balance, weight shift, and RUE NMR using the BITS system. Pt was able to scan in all four quadrants of the screen and required intermittent min facilitation for R shoulder flexion past 80 degrees. She was cued for reducing shoulder abduction and trap compensation. Pt completed several trials with a seated rest break between. She had several questions re [facility] NMR that were answered throughout. Pt then completed functional reaching activity in standing in the kitchen for functional NMR. She required cueing for hand prehension, as well as proper motor patterns when reaching forward. Min facilitation overall. Pt returned to her room and transferred to the toilet with CGA. CGA-min A for all sit <> stands throughout session. Pt was left sitting on the toilet, agreeable to pull call bell when ready to get off toilet.   Therapy Documentation Precautions:  Precautions Precautions: Fall Precaution Comments: Rt hemiplegia - subluxation Restrictions Weight Bearing Restrictions: No   Therapy/Group: Individual Therapy  Curtis Sites 01/16/2020, 6:41 AM

## 2020-01-16 NOTE — Progress Notes (Signed)
Patient ID: Katie Nunez, female   DOB: [birth year], 65 y.o.   MRN: [id number]  SW called pt husband and left message to inform on making contact with [facility]. Reports that he has spoken with them about DME. SW to follow-up with [facility] to discuss referral.   SW waiting on updates from [facility] about HHPT/OT/aide/?RN referral for pt. Pt previously established with agency for [facility]; certification ended on 12/18/19, so will need to know if pt can be accepted for services again.   Loralee Pacas, MSW, [place] Office: [phone] Cell: [phone] Fax: [phone]

## 2020-01-16 NOTE — Progress Notes (Signed)
Resting at interval throughout shift, prn Tramadol administered for right foot/and great toe pain rate pain 7/10 on pain scale. ADL care provided, small nose bleed noted after episode  of coughing, cough medicine provided, VSS, coloration adequate. Made as comfortable as possible, right hand splint and SCD's on for short period of time per patient's request, stating it is uncomfortable.Call bell, bedside table and bed alarm in reach. Continue medical regime,

## 2020-01-17 ENCOUNTER — Inpatient Hospital Stay (HOSPITAL_COMMUNITY): Payer: Medicare Other | Admitting: Occupational Therapy

## 2020-01-17 ENCOUNTER — Inpatient Hospital Stay (HOSPITAL_COMMUNITY): Payer: Medicare Other | Admitting: Physical Therapy

## 2020-01-17 LAB — CBC
HCT: 30.1 % — ABNORMAL LOW (ref 36.0–46.0)
Hemoglobin: 10.1 g/dL — ABNORMAL LOW (ref 12.0–15.0)
MCH: 30.4 pg (ref 26.0–34.0)
MCHC: 33.6 g/dL (ref 30.0–36.0)
MCV: 90.7 fL (ref 80.0–100.0)
Platelets: 159 10*3/uL (ref 150–400)
RBC: 3.32 MIL/uL — ABNORMAL LOW (ref 3.87–5.11)
RDW: 16.2 % — ABNORMAL HIGH (ref 11.5–15.5)
WBC: 4.2 10*3/uL (ref 4.0–10.5)
nRBC: 0 % (ref 0.0–0.2)

## 2020-01-17 LAB — BASIC METABOLIC PANEL
Anion gap: 17 — ABNORMAL HIGH (ref 5–15)
BUN: 54 mg/dL — ABNORMAL HIGH (ref 8–23)
CO2: 24 mmol/L (ref 22–32)
Calcium: 9.6 mg/dL (ref 8.9–10.3)
Chloride: 96 mmol/L — ABNORMAL LOW (ref 98–111)
Creatinine, Ser: 7.05 mg/dL — ABNORMAL HIGH (ref 0.44–1.00)
GFR, Estimated: 6 mL/min — ABNORMAL LOW (ref >60)
Glucose, Bld: 84 mg/dL (ref 70–99)
Potassium: 4 mmol/L (ref 3.5–5.1)
Sodium: 137 mmol/L (ref 135–145)

## 2020-01-17 MED ORDER — SELEXIPAG 200 MCG PO TABS
800.0000 ug | ORAL_TABLET | Freq: Two times a day (BID) | ORAL | Status: DC
  Administered 2020-01-17 – 2020-01-23 (×12): 800 ug via ORAL
  Filled 2020-01-17 (×12): qty 4

## 2020-01-17 MED ORDER — RENA-VITE PO TABS
1.0000 | ORAL_TABLET | Freq: Every day | ORAL | Status: DC
  Administered 2020-01-17 – 2020-01-23 (×7): 1 via ORAL
  Filled 2020-01-17 (×8): qty 1

## 2020-01-17 MED ORDER — DARBEPOETIN ALFA 40 MCG/0.4ML IJ SOSY
PREFILLED_SYRINGE | INTRAMUSCULAR | Status: AC
  Administered 2020-01-17: 40 ug via INTRAVENOUS
  Filled 2020-01-17: qty 0.4

## 2020-01-17 NOTE — Progress Notes (Signed)
Occupational Therapy Session Note  Patient Details  Name: Katie Nunez MRN: [id number] Date of Birth: [birth year]  Today's Date: 01/17/2020 OT Individual Time: 1025-8527   &    1100-1158 OT Individual Time Calculation (min): 78 min   &  38 min   Short Term Goals: Week 5:  OT Short Term Goal 1 (Week 5): LTG=STG 2.2 ELOS  Skilled Therapeutic Interventions/Progress Updates:    Session 1:   Patient seated on toilet, alert and aware of needs.  She denies pain.   Unable to void at this time.  Sit to stand and short distance ambulation with RW to/from toilet, shower bench and w/c with CGA.  She completed undressing with CS.  Shower/bathing seated on shower bench with min A.  Dressing completed seated on w/c - min A for bra and OH shirt, min A for incontinence brief and pants, CGA for CM in stance, min A for socks/shoes, grooming tasks at sink with CS/set up.   SPT to/from w/c and therapy mat table with CGA, cues for weight shift and right knee control.  Completed seated posture, trunk control and right UE light stretch - note no tremor and less stretching needed today to achieve full ROM.  She remained seated in w/c at close of session, call bell and tray table in reach.     Session 2:    Patient finishing up PT session - she notes mild fatigue but is ready for OT session.  Completed BITS activities with focus on standing balance, weight shift, right arm coordination and reach, use of right arm as support - good results with all activities - note improved reach and use of right arm to touch targets using stylus, min A for right knee control and improving weight shifts:  Last activity - whole screen, saccades # 1-20, size 8 using right arm accuracy = 65%, reaction time = 3.16 sec  Completed OH wall slides - shoulder flexion in stance with CGA, abd and abd with ER in seated position with min A - cues for posture.   Completed elbow extension and control activities with good results.  Note that she is  using right hand for functional activity such as opening containers and reaching for objects.   She remained seated in w/c at close of session, call bell and tray table in reach.      Therapy Documentation Precautions:  Precautions Precautions: Fall Precaution Comments: Rt hemiplegia - subluxation Restrictions Weight Bearing Restrictions: No Other Treatments:     Therapy/Group: Individual Therapy  Carlos Levering 01/17/2020, 7:28 AM

## 2020-01-17 NOTE — Progress Notes (Signed)
[facility] Progress Note   Subjective:  Seen in room - working with PT. Says was wiped out after last HD, thinks too much UF. Feels better today. No CP/dyspnea.  Objective Vitals:   01/16/20 1336 01/16/20 2006 01/17/20 0557 01/17/20 0625  BP: 102/62 112/63 (!) 110/56   Pulse: 91 91 99   Resp: 14 18    Temp: 98.4 F (36.9 C) 98.3 F (36.8 C) 98.4 F (36.9 C)   TempSrc:  Oral Oral   SpO2: 94% 92% 94%   Weight:    48.5 kg  Height:       Physical Exam General: Thin, but well appearing woman, NAD. Room air Heart: RRR; no murmur Lungs: CTAB; no rales Abdomen: soft Extremities: No LE edema Dialysis Access:  LUE AVF + bruit  Additional Objective Labs: Basic Metabolic Panel: Recent Labs  Lab 01/10/20 1416 01/12/20 1329 01/13/20 0528 01/15/20 0504 01/16/20 0550 01/17/20 0448  NA 134* 134*   < > 132* 136 137  K 5.1 4.6   < > 4.3 3.7 4.0  CL 96* 96*   < > 95* 98 96*  CO2 22 23   < > 22 25 24   GLUCOSE 101* 75   < > 82 81 84  BUN 60* 52*   < > 66* 33* 54*  CREATININE 8.03* 7.26*   < > 8.24* 4.49* 7.05*  CALCIUM 10.2 9.3   < > 9.3 9.0 9.6  PHOS 6.3* 5.5*  --   --   --   --    < > = values in this interval not displayed.   Liver Function Tests: Recent Labs  Lab 01/10/20 1416 01/12/20 1329  ALBUMIN 3.1* 3.0*   CBC: Recent Labs  Lab 01/13/20 0528 01/14/20 0608 01/15/20 0504 01/16/20 0550 01/17/20 0448  WBC 5.0 4.9 4.0 3.4* 4.2  HGB 11.8* 10.4* 10.1* 10.7* 10.1*  HCT 35.9* 32.8* 29.7* 32.3* 30.1*  MCV 93.2 92.4 90.3 91.0 90.7  PLT 116* 149* 156 126* 159   Medications:  . (feeding supplement) PROSource Plus  30 mL Oral BID BM  . ambrisentan  5 mg Oral Daily  . atorvastatin  40 mg Oral Daily  . calcium acetate  1,334 mg Oral TID WC  . camphor-menthol   Topical BID  . COVID-19 mRNA vaccine (Moderna)  0.25 mL Intramuscular Once  . darbepoetin (ARANESP) injection - DIALYSIS  40 mcg Intravenous Q Thu-HD  . feeding supplement (NEPRO CARB STEADY)   237 mL Oral BID BM  . hydrocerin   Topical BID  . hydrocortisone  25 mg Rectal BID  . loratadine  10 mg Oral Daily  . multivitamin  1 tablet Oral Q0600  . pentoxifylline  400 mg Oral Daily  . psyllium  1 packet Oral BID  . Selexipag  800 mcg Oral BID  . senna-docusate  1 tablet Oral BID  . sodium chloride  1 spray Each Nare 5 X Daily  . tadalafil  20 mg Oral Daily    Dialysis Orders: SW TTS 3.5h 450/600 2/2.25 bath 58.5kg P2 LUA AVF Hep none - Mircera 50 mcg IV q 4 weeks - Hectorol 6 mcg IV TIW - Sensipar 30 mg PO TIW  Assessment/Plan: 1. Intracerebral hemorrhage: Suspected to be related to microvascular disease andwarfarin - now stopped.Repeat CT scan of the head showing no extension of hematoma. Because she suffered significant neurological deficits of the right upper and lower extremities, she was admitted to CIR for intensive PT/OT.  Paresis  improving 2. ESRD:Continue HD per TTS schedule - HD later today. 3. Anemia:Hgb at goal- getting low dose Aranesp q Thurs 4. CKD-MBD:Ca improving, Phos ok.PTH 13 - stopped VDRA and sensipar.On Phoslo as binder - tried other binders and had issues swallowing them and not interested in chewable binder. Will use low Ca bath with HD as well. 5. Nutrition:Alb low, continue Nepro supplements.Diet liberalized to regular with fluid restrictions. 6. Hypertension: BP stable without edema, she has lost quite a bit of body weight during prolonged hospitalization - needs new EDW.  Veneta Penton, PA-C 01/17/2020, 9:55 AM  [employer]

## 2020-01-17 NOTE — Progress Notes (Addendum)
Pt requested Klonopin, Gabapentin, flexeril and Atarax. Pt was experiencing anxiety, itching, and nerve pain. PRN meds given. Pt in no acute distress. RN will continue to monitor.

## 2020-01-17 NOTE — Progress Notes (Signed)
Patient ID: Katie Nunez, female   DOB: [birth year], 65 y.o.   MRN: [id number]  SW waiting on updates from [facility] about referral.   Reports that today, branch is not willing to accept referral even though for next week. SW to follow-up on Monday to see if this is an option, otherwise, SW will seek another HHA.   Loralee Pacas, MSW, [place] Office: [phone] Cell: [phone] Fax: [phone]

## 2020-01-17 NOTE — Progress Notes (Signed)
Physical Therapy Session Note  Patient Details  Name: Katie Nunez MRN: [id number] Date of Birth: 04-10-1955  Today's Date: 01/17/2020 PT Individual Time: 1007-1100 PT Individual Time Calculation (min): 53 min   Short Term Goals: Week 4:  PT Short Term Goal 1 (Week 4): STGs=LTGs  Skilled Therapeutic Interventions/Progress Updates:    Pt received sitting in w/c and agreeable to therapy session. Transported to/from gym in w/c for time management and energy conservation. Sit<>stands using RW with cuing for R hand placement on/off RW orthotic with min assist for balance 1st time due to R posterior lean but otherwise with CGA and improved balance. Gait training 144ft using RW with min assist for balance - able to advance R LE but inconsistent foot clearance and step length sometimes requiring multiple attempts to get full step forward - occasional R knee hyperextension during stance pending step length and foot placement on initial contact otherwise has good knee control with a slight "bobbing" of the knee - during swing has excessive R hip adduction causing narrow BOS. R LE NMR at stairs with B UE support using 3lb weight on R ankle while having pt start from hip extended position and perform R hip/knee flexion to step over yoga block and then place R foot on 1st 6" step - pt demos good hip flexor activation with only slightly less knee flexion activation. Standing at satairs for B UE support performed x5 reps R LE hamstring curls with limited AROM noted. Seated in w/c, R LE NMR via single leg forward w/c propulsion targeting hamstring activation ~16ft. R LE NMR via lateral side stepping at hallway rail ~23ft down/back with min assist for balance and B UE support on railing - cuing for increased R hip/knee flexion when stepping to clear foot and for increased hip abductor activation during stance for increased hip stability. Repeated sit<>stands to/from EOM no UE support with mirror feedback and external  target to activate hip abductors x10 reps - min assist for balance. Gait training ~132ft using RW with continued min assist and gait deviations as noted above with some improvement in R hip/knee flexion activation during swing. Pt left sitting in w/c with handoff to [place], [place].  Therapy Documentation Precautions:  Precautions Precautions: Fall Precaution Comments: Rt hemiplegia - subluxation Restrictions Weight Bearing Restrictions: No  Pain:   No reports of pain throughout session.  Therapy/Group: Individual Therapy  Tawana Scale , PT, DPT, CSRS  01/17/2020, 7:59 AM

## 2020-01-17 NOTE — Progress Notes (Signed)
[facility] PHYSICAL MEDICINE & REHABILITATION PROGRESS NOTE   Subjective/Complaints: Experienced some itching last night. Wants to change vitamin to morning, refused last night. Otherwise feeling well today  ROS: Patient denies fever, rash, sore throat, blurred vision, nausea, vomiting, diarrhea, cough, shortness of breath or chest pain, joint or back pain, headache, or mood change.   Objective:   No results found. Recent Labs    01/16/20 0550 01/17/20 0448  WBC 3.4* 4.2  HGB 10.7* 10.1*  HCT 32.3* 30.1*  PLT 126* 159   Recent Labs    01/16/20 0550 01/17/20 0448  NA 136 137  K 3.7 4.0  CL 98 96*  CO2 25 24  GLUCOSE 81 84  BUN 33* 54*  CREATININE 4.49* 7.05*  CALCIUM 9.0 9.6    Intake/Output Summary (Last 24 hours) at 01/17/2020 9509 Last data filed at 01/17/2020 0758 Gross per 24 hour  Intake 720 ml  Output --  Net 720 ml     Pressure Injury 12/18/19 Sacrum Medial Stage 1 -  Intact skin with non-blanchable redness of a localized area usually over a bony prominence. Red, non-blanchable spot over sacrum area (Active)  12/18/19 1707  Location: Sacrum  Location Orientation: Medial  Staging: Stage 1 -  Intact skin with non-blanchable redness of a localized area usually over a bony prominence.  Wound Description (Comments): Red, non-blanchable spot over sacrum area  Present on Admission: Yes   Physical Exam: Constitutional: No distress . Vital signs reviewed. HEENT: EOMI, oral membranes moist Neck: supple Cardiovascular: RRR without murmur. No JVD    Respiratory/Chest: CTA Bilaterally without wheezes or rales. Normal effort    GI/Abdomen: BS +, non-tender, non-distended Ext: no clubbing, cyanosis, or edema Psych: pleasant and cooperative Skin: Warm and dry.  No breakdown Musc: No edema in extremities.  No tenderness in extremities. Neuro: Alert Motor: LUE/LE: 5/5 proximal distal RUE: Shoulder abduction, elbow flexion/extension 4/5, wrist extension 3+/5, handgrip  3+/5 with tremor noted Right lower extremity: 4/5 hip flexion, knee extension, 2 /5 ankle dorsiflexion/PF   Vital Signs Blood pressure (!) 110/56, pulse 99, temperature 98.4 F (36.9 C), temperature source Oral, resp. rate 18, height 5\' 5"  (1.651 m), weight 48.5 kg, last menstrual period 11/15/2008, SpO2 94 %.  Assessment/Plan: 1. Functional deficits which require 3+ hours per day of interdisciplinary therapy in a comprehensive inpatient rehab setting.  Physiatrist is providing close team supervision and 24 hour management of active medical problems listed below.  Physiatrist and rehab team continue to assess barriers to discharge/monitor patient progress toward functional and medical goals  Care Tool:  Bathing    Body parts bathed by patient: Chest,Abdomen,Right upper leg,Left upper leg,Right arm,Face,Front perineal area,Buttocks,Left arm,Left lower leg,Right lower leg   Body parts bathed by helper: Right lower leg,Left lower leg     Bathing assist Assist Level: Minimal Assistance - Patient > 75%     Upper Body Dressing/Undressing Upper body dressing   What is the patient wearing?: Pull over shirt    Upper body assist Assist Level: Supervision/Verbal cueing    Lower Body Dressing/Undressing Lower body dressing      What is the patient wearing?: Pants     Lower body assist Assist for lower body dressing: Contact Guard/Touching assist     Toileting Toileting    Toileting assist Assist for toileting: Minimal Assistance - Patient > 75%     Transfers Chair/bed transfer  Transfers assist     Chair/bed transfer assist level: Minimal Assistance - Patient >  75%     Locomotion Ambulation   Ambulation assist      Assist level: Minimal Assistance - Patient > 75% Assistive device: Walker-rolling Max distance: 75'   Walk 10 feet activity   Assist     Assist level: Minimal Assistance - Patient > 75% Assistive device: Walker-rolling   Walk 50 feet  activity   Assist Walk 50 feet with 2 turns activity did not occur: Safety/medical concerns  Assist level: Minimal Assistance - Patient > 75% Assistive device: Walker-rolling    Walk 150 feet activity   Assist Walk 150 feet activity did not occur: Safety/medical concerns         Walk 10 feet on uneven surface  activity   Assist Walk 10 feet on uneven surfaces activity did not occur: Safety/medical concerns         Wheelchair     Assist Will patient use wheelchair at discharge?: Yes Type of Wheelchair: Manual Wheelchair activity did not occur: Safety/medical concerns  Wheelchair assist level: Supervision/Verbal cueing Max wheelchair distance: 20'    Wheelchair 50 feet with 2 turns activity    Assist    Wheelchair 50 feet with 2 turns activity did not occur: Safety/medical concerns   Assist Level: Supervision/Verbal cueing   Wheelchair 150 feet activity     Assist  Wheelchair 150 feet activity did not occur: Safety/medical concerns       Blood pressure (!) 110/56, pulse 99, temperature 98.4 F (36.9 C), temperature source Oral, resp. rate 18, height 5\' 5"  (1.651 m), weight 48.5 kg, last menstrual period 11/15/2008, SpO2 94 %.  Medical Problem List and Plan: 1.  L frontal ICH secondary to recent starting of Coumadin- Coumadin on hold with R hemiparesis             Continue CIR  Continue WHO at night.   -extended LOS to capture neurological gains, improve safety at home 2.  Portal vein thrombosis/A fib/Antithrombotics: -DVT/anticoagulation:  Pharmaceutical: SCDs due to thrombocytopenia and recent bleed.  --per neuro no coumadin-->will need to wait at least one month due to size of bleed, patient to follow-up as outpatient with neurology  --dopplers ok  -resume statin  per neuro recs, explained rationale for lipitor to patient. Discussed potential side effects.              -antiplatelet therapy: N/A 3. Pain Management: tylenol prn.   - kpad  for neck pain  -will replace robaxin with flexeril prn  - tramadol sparingly: q8H prn as per patient request. Prefers to keep at q8- continue at this time.   Controlled on 1/6 4. Mood: LCSW to follow up for evaluation and support.              -antipsychotic agents: N/A  -added trazodone qhs prn for sleep 5. Neuropsych: This patient is capable of making decisions on her own behalf. 6. Wound ulcer/Skin/Wound Care: Pack daily wound care LLE.    -turning, nutrition, education  -eucerin cream bid  -pruritus generally improved, can use prn's 7. Fluids/Electrolytes/Nutrition: Strict I/Os.  -changed renalvit to AM per pt request 8.  ESRD: Continue HD on TTS at the end of the day to help with therapy tolerance.  9. HTN: Monitor BP   Soft, but asymptomatic on 1/6-no changes  10. Pulmonary HTN: Oxygen dependent. Continue Ambrisentan and Selexipag. On Home dose 2L by [place] 11. Cirrhosis of the liver: Monitor platelets and for any signs of bleeding. Reporting abdominal distension.  (protal vein thrombus)  -  f/u abd CT as outpt 12. Chronic systolic CHF: Monitor daily weights as well as I/O. Continue tadalafil.    Filed Weights   01/15/20 1301 01/16/20 0500 01/17/20 0625  Weight: 52.7 kg 48.7 kg 48.5 kg    -volume mgt per nephro  Decreased 1/5: continue to monitor daily 13. CAF: Monitor HR tid--metoprolol has been on hold. Coumadin to be held due to [place].  14. Pancytopenia:  Continue to monitor with serial checks.   Hemoglobin 11.8 on 1/2-->10.4 1/3--> 10.1 1/4  Platelets 116 on 1/2  --. 149 1/3---> 156 1/4  15. Constipation WITH hemorrhoids- seems to cycle between loose stool  -prn miralax only  -daily fiber packet  -encouraging regular solid/liquid intake as well  --resumed senna-s at bedtime 12/15  -Continue Anusol bid, sitz bath prn for hems  1/4 stools are generally soft to mushy, no changes to regimen despite pt's c/o constipation 16. Significant epistaxis (pt has prior history)  12/18-Dr.  Benjamine Mola cauterized hypervascular areas in nasal septi  Follow-up with Dr. Benjamine Mola outpatient.  -continue saline nasal spray, prn afrin, astelin 17.  Sleep disturbance:   Continue 50mg  trazodone qhs prn  Improving overall 18. Right neck nodule appears resolved. Likely node 20. Sinus congestion: Continue Sudafed 21. Sore throat: Added lozenges and throat spray prn.  22. Ear fullness:  debrox ear drops BID---can dc. Has been receiving for a week +  LOS: 30 days A FACE TO [place] 01/17/2020, 9:17 AM

## 2020-01-18 ENCOUNTER — Inpatient Hospital Stay (HOSPITAL_COMMUNITY): Payer: Medicare Other

## 2020-01-18 ENCOUNTER — Inpatient Hospital Stay (HOSPITAL_COMMUNITY): Payer: Medicare Other | Admitting: Occupational Therapy

## 2020-01-18 ENCOUNTER — Inpatient Hospital Stay (HOSPITAL_COMMUNITY): Payer: Medicare Other | Admitting: Physical Therapy

## 2020-01-18 MED ORDER — HYOSCYAMINE SULFATE 0.125 MG SL SUBL
0.1250 mg | SUBLINGUAL_TABLET | Freq: Three times a day (TID) | SUBLINGUAL | Status: DC | PRN
  Administered 2020-01-19 – 2020-01-20 (×2): 0.125 mg via SUBLINGUAL
  Filled 2020-01-18 (×5): qty 1

## 2020-01-18 NOTE — Plan of Care (Signed)
  Problem: Consults Goal: RH STROKE PATIENT EDUCATION Description: See Patient Education module for education specifics  Outcome: Progressing   Problem: RH BOWEL ELIMINATION Goal: RH STG MANAGE BOWEL WITH ASSISTANCE Description: STG Manage Bowel with [place]. Outcome: Progressing   Problem: RH SKIN INTEGRITY Goal: RH STG SKIN FREE OF INFECTION/BREAKDOWN Description: No new breakdown with min assist/cues  Outcome: Progressing Goal: RH STG ABLE TO PERFORM INCISION/WOUND CARE W/ASSISTANCE Description: STG Able To Perform Incision/Wound Care With Mod Assistance. Outcome: Progressing   Problem: RH COGNITION-NURSING Goal: RH STG ANTICIPATES NEEDS/CALLS FOR ASSIST W/ASSIST/CUES Description: STG Anticipates Needs/Calls for Assist With Min Assistance/Cues. Outcome: Progressing   Problem: RH PAIN MANAGEMENT Goal: RH STG PAIN MANAGED AT OR BELOW PT'S PAIN GOAL Description: < 4 out of 10.  Outcome: Progressing   Problem: RH KNOWLEDGE DEFICIT Goal: RH STG INCREASE KNOWLEDGE OF STROKE PROPHYLAXIS Description: Pt will demonstrate increased understanding of stroke prevention methods including chronic disease management, diet management, medication management, and follow up care with primary physician using the educational materials given while in rehab.  Outcome: Progressing

## 2020-01-18 NOTE — Progress Notes (Signed)
Occupational Therapy Session Note  Patient Details  Name: Katie Nunez MRN: [id number] Date of Birth: [birth year]  Today's Date: 01/18/2020 OT Individual Time: 8295-6213 OT Individual Time Calculation (min): 75 min    Short Term Goals: Week 4:  OT Short Term Goal 1 (Week 4): LTG=STG 2/2 ELOS  Skilled Therapeutic Interventions/Progress Updates:    Treatment session with focus on self-care retraining, functional transfers, and RUE NMR.  Pt received upright in bed eating breakfast.  Pt very apologetic for not being prepared for therapy session due to no one waking her for breakfast.  Pt completed bed mobility with CGA with posterior lean initially then supervision for sitting balance.  Pt donned pants with Min assist for threading RLE and then Min assist for standing balance to pull pants over hips.  Pt attempted donning socks and shoes this session, with pt able to don B socks, but requiring assistance with donning R shoe and fastening B shoes.  Pt donned shirt with setup assist, requiring assistance to don bra.  Pt completed sit > stand and stand pivot transfers throughout session with Min assist.  Pt reports increased tremors over past 3 days in RUE, however also notes improved activation and ROM in RUE.  Engaged in functional grasp incorporating wrist extension in sitting with focus on picking up pegs and placing them in container on R.  Pt demonstrating decreased wrist extension and difficulty with supination/pronation when removing pegs.  Also of note, pt with increased tremors during reaching and attempts at grasping.  Attempted to utilize resistive clothes pins to facilitate increased wrist extension, however pt unable to achieve grasp in this position.  Modified task to focus on increased wrist extension then integrating flexion to tap items.  Encouraged pt to engage in wrist flexion/extension exercises, finger extension, and finger abduction/adduction.  Pt returned to room and remained upright  in w/c with seat belt alarm on and breakfast set up.  Therapy Documentation Precautions:  Precautions Precautions: Fall Precaution Comments: Rt hemiplegia - subluxation Restrictions Weight Bearing Restrictions: No Vital Signs: Therapy Vitals Pulse Rate: 91 BP: 123/67 Oxygen Therapy SpO2: 98 % Pain:  Pt with no c/o pain   Therapy/Group: Individual Therapy  Simonne Come 01/18/2020, 9:23 AM

## 2020-01-18 NOTE — Progress Notes (Signed)
Physical Therapy Session Note  Patient Details  Name: Katie Nunez MRN: [id number] Date of Birth: Apr 17, 1955  Today's Date: 01/18/2020 PT Individual Time: 1425-1540 PT Individual Time Calculation (min): 75 min   Short Term Goals: Week 4:  PT Short Term Goal 1 (Week 4): STGs=LTGs  Skilled Therapeutic Interventions/Progress Updates: Pt presented to w/c agreeable to therapy. Pt denies pain during treatment. Pt transported to rehab gym and participated in R NMR via forced use. Performed lateral steps in parallel bars with hockey sticks to have pt increase step height and length. Pt also performed forward steps over hockey stick stepping into mini-lunge then stepping back to midline. Cues to maintain neutral hip and foot and to achieve TKE when returning foot to midline. Performed R TKE x10 and step ups to 2in step 2 x 5. Pt then transported to day room and participated in gait training 28f x 2 with RW and minA. Verbal cues provided to step towards R RW wheel to increase BOS and PTA facilitating at quads to achieve TKE in stance phase however pt would intermittently push into genu recurvatum or demonstrate some R knee instability (bobbling) however no LOB. On second bout pt demonstrated improved knee stability and more fluid cadence. Participated in Cybex Kinetron seated 50cm/sec then 20cm/sec with PTA providing gentle overpressure when pt extending RLE. Pt also performed STS in Cybex Kinetron with cues to maintain equal foot plates then proceeded x 10 marches (2 bouts). Pt transported back to room at end of session and remained in w/c with call bell within reach and needs met.      Therapy Documentation Precautions:  Precautions Precautions: Fall Precaution Comments: Rt hemiplegia - subluxation Restrictions Weight Bearing Restrictions: No General:   Vital Signs:   Pain: Pain Assessment Pain Scale: 0-10 Pain Score: 0-No pain Faces Pain Scale: Hurts a little bit Pain Type: Acute  pain Pain Location: Head Pain Orientation: Anterior Pain Descriptors / Indicators: Headache Pain Frequency: Intermittent Pain Onset: Gradual Patients Stated Pain Goal: 1 Pain Intervention(s): Medication (See eMAR) Multiple Pain Sites: No    Therapy/Group: Individual Therapy  Skylee Baird  Jamarie Mussa, PTA  01/18/2020, 4:06 PM

## 2020-01-18 NOTE — Progress Notes (Signed)
Physical Therapy Session Note  Patient Details  Name: Katie Nunez MRN: [id number] Date of Birth: 1955/06/21  Today's Date: 01/18/2020 PT Individual Time: 1030-1100 PT Individual Time Calculation (min): 30 min   Short Term Goals: Week 4:  PT Short Term Goal 1 (Week 4): STGs=LTGs  Skilled Therapeutic Interventions/Progress Updates:    Patient in w/c in room filing her nails and applying oil to dry hands.  Assisted in w/c to gym.  In parallel bars pt sit to stand with S/CGA and cues and forward stepping over cones focus on R foot clearance and stability in stance stepping over with L and mod cues for R hand keeping close to her on bars.  Able to do step over step for last trial, but mostly step to pattern.  Performed side stepping over cones after demonstration and cues for technique, assist/cues again for R hand on rail.  Patient standing with L foot on 4" step for R weight shift and min A for balance to play checkers in standing stabilizing with R hand and moving game pieces with L.  Patient fatigued halfway and placed L foot on floor for remainder of game standing about 10 minutes total.  Assisted in w/c to room.  Left with call bell and needs in reach.  Therapy Documentation Precautions:  Precautions Precautions: Fall Precaution Comments: Rt hemiplegia - subluxation Restrictions Weight Bearing Restrictions: No Pain: Pain Assessment Pain Scale: Faces Faces Pain Scale: Hurts a little bit Pain Type: Acute pain Pain Location: Hand Pain Orientation: Right;Left Pain Descriptors / Indicators: Sore;Aching Pain Onset: With Activity Pain Intervention(s): Repositioned    Therapy/Group: Individual Therapy  Reginia Naas  Magda Kiel, PT 01/18/2020, 12:14 PM

## 2020-01-18 NOTE — Progress Notes (Signed)
Occupational Therapy Session Note  Patient Details  Name: Katie Nunez MRN: [id number] Date of Birth: Mar 20, 1955  Today's Date: 01/18/2020 OT Individual Time: 1135-1200 OT Individual Time Calculation (min): 25 min (unattended estim on R shoulder for sh abd 1200-1300)   Short Term Goals: Week 5:  OT Short Term Goal 1 (Week 5): LTG=STG 2.2 ELOS  Skilled Therapeutic Interventions/Progress Updates:    Pt received in wc ready for her session. She demonstrated that she can now abd R arm to 45 degrees, lift arm to 100 flexion, grasp.  She discussed her limited wrist extension, worked on joint mobilization and then PROM to wrist followed by AROM exercises (towel wringing, twisting washcloth around tube, and standing sliding arm back and keeping hand on table to "force" wrist extension).  She did well with these exercises.  Reviewed which ones she can do in the room.  Worked on standing for at least 20 min with occasional cues to extend R leg while working on resisted closed chain exercises for shoulder.    Placed saebo stim one on R shoulder for abduction for cyclic unattended estim for   Minutes.  Pt tolerated well.  Saebo Stim One 330 pulse width 35 Hz pulse rate On 8 sec/ off 8 sec Ramp up/ down 2 sec Symmetrical Biphasic wave form  Max intensity 144m at 500 Ohm load  Pt resting in wc with all needs met.   Therapy Documentation Precautions:  Precautions Precautions: Fall Precaution Comments: Rt hemiplegia - subluxation Restrictions Weight Bearing Restrictions: No   Pain: Pain Assessment Pain Scale: Faces Faces Pain Scale: Hurts a little bit Pain Type: Acute pain Pain Location: Hand Pain Orientation: Right;Left Pain Descriptors / Indicators: Sore;Aching Pain Onset: With Activity Pain Intervention(s): Repositioned    Therapy/Group: Individual Therapy  SWest Wood1/07/2020, 12:40 PM

## 2020-01-18 NOTE — Progress Notes (Signed)
[facility] Progress Note   Subjective:  Seen in room Completed HD with net UF 1L. No issues with dialysis yesterday. Therapy going well.   Objective Vitals:   01/17/20 1749 01/17/20 1826 01/17/20 1947 01/18/20 0537  BP: 115/68 (!) 126/57 (!) 100/57 123/67  Pulse:  97 98 91  Resp: 15 17 16    Temp: 98.3 F (36.8 C) 98.6 F (37 C) 98.6 F (37 C)   TempSrc: Oral     SpO2:  93% 95% 98%  Weight:      Height:       Physical Exam General: Thin, but well appearing woman, NAD. Room air Heart: RRR; no murmur Lungs: CTAB; no rales Abdomen: soft Extremities: No LE edema Dialysis Access:  LUE AVF + bruit  Additional Objective Labs: Basic Metabolic Panel: Recent Labs  Lab 01/12/20 1329 01/13/20 0528 01/15/20 0504 01/16/20 0550 01/17/20 0448  NA 134*   < > 132* 136 137  K 4.6   < > 4.3 3.7 4.0  CL 96*   < > 95* 98 96*  CO2 23   < > 22 25 24   GLUCOSE 75   < > 82 81 84  BUN 52*   < > 66* 33* 54*  CREATININE 7.26*   < > 8.24* 4.49* 7.05*  CALCIUM 9.3   < > 9.3 9.0 9.6  PHOS 5.5*  --   --   --   --    < > = values in this interval not displayed.   Liver Function Tests: Recent Labs  Lab 01/12/20 1329  ALBUMIN 3.0*   CBC: Recent Labs  Lab 01/13/20 0528 01/14/20 0608 01/15/20 0504 01/16/20 0550 01/17/20 0448  WBC 5.0 4.9 4.0 3.4* 4.2  HGB 11.8* 10.4* 10.1* 10.7* 10.1*  HCT 35.9* 32.8* 29.7* 32.3* 30.1*  MCV 93.2 92.4 90.3 91.0 90.7  PLT 116* 149* 156 126* 159   Medications:  . (feeding supplement) PROSource Plus  30 mL Oral BID BM  . ambrisentan  5 mg Oral Daily  . atorvastatin  40 mg Oral Daily  . calcium acetate  1,334 mg Oral TID WC  . camphor-menthol   Topical BID  . COVID-19 mRNA vaccine (Moderna)  0.25 mL Intramuscular Once  . darbepoetin (ARANESP) injection - DIALYSIS  40 mcg Intravenous Q Thu-HD  . feeding supplement (NEPRO CARB STEADY)  237 mL Oral BID BM  . hydrocerin   Topical BID  . loratadine  10 mg Oral Daily  . multivitamin  1  tablet Oral Q0600  . pentoxifylline  400 mg Oral Daily  . psyllium  1 packet Oral BID  . Selexipag  800 mcg Oral BID  . senna-docusate  1 tablet Oral BID  . sodium chloride  1 spray Each Nare 5 X Daily  . tadalafil  20 mg Oral Daily    Dialysis Orders: SW TTS 3.5h 450/600 2/2.25 bath 58.5kg P2 LUA AVF Hep none - Mircera 50 mcg IV q 4 weeks - Hectorol 6 mcg IV TIW - Sensipar 30 mg PO TIW  Assessment/Plan: 1. Intracerebral hemorrhage: Suspected to be related to microvascular disease andwarfarin - now stopped.Repeat CT scan of the head showing no extension of hematoma. Because she suffered significant neurological deficits of the right upper and lower extremities, she was admitted to CIR for intensive PT/OT.  Paresis improving 2. ESRD:Continue HD per TTS schedule - Next HD 1/8  3. Anemia:Hgb at goal- getting low dose Aranesp q Thurs 4. CKD-MBD:Ca improving, Phos ok.PTH  13 - stopped VDRA and sensipar.On Phoslo as binder - tried other binders and had issues swallowing them and not interested in chewable binder. Will use low Ca bath with HD as well. 5. Nutrition:Alb low, continue Nepro supplements.Diet liberalized to regular with fluid restrictions. 6. Hypertension: BP stable without edema, she has lost quite a bit of body weight during prolonged hospitalization - needs new EDW.  Lynnda Child PA-C [facility] 01/18/2020,10:27 AM

## 2020-01-18 NOTE — Progress Notes (Signed)
[place] PHYSICAL MEDICINE & REHABILITATION PROGRESS NOTE   Subjective/Complaints: Had soft stool last night but still felt that there was something hard, uncomfortable remaining. No stool in vault. Pt wondered if it could be her hemorrhoids. Still having minor nose bleeds  ROS: Patient denies fever, rash, sore throat, blurred vision, nausea, vomiting,  cough, shortness of breath or chest pain, joint or back pain, headache, or mood change.   Objective:   No results found. Recent Labs    01/16/20 0550 01/17/20 0448  WBC 3.4* 4.2  HGB 10.7* 10.1*  HCT 32.3* 30.1*  PLT 126* 159   Recent Labs    01/16/20 0550 01/17/20 0448  NA 136 137  K 3.7 4.0  CL 98 96*  CO2 25 24  GLUCOSE 81 84  BUN 33* 54*  CREATININE 4.49* 7.05*  CALCIUM 9.0 9.6    Intake/Output Summary (Last 24 hours) at 01/18/2020 1610 Last data filed at 01/17/2020 1745 Gross per 24 hour  Intake 240 ml  Output 1000 ml  Net -760 ml     Pressure Injury 12/18/19 Sacrum Medial Stage 1 -  Intact skin with non-blanchable redness of a localized area usually over a bony prominence. Red, non-blanchable spot over sacrum area (Active)  12/18/19 1707  Location: Sacrum  Location Orientation: Medial  Staging: Stage 1 -  Intact skin with non-blanchable redness of a localized area usually over a bony prominence.  Wound Description (Comments): Red, non-blanchable spot over sacrum area  Present on Admission: Yes   Physical Exam: Constitutional: No distress . Vital signs reviewed. HEENT: EOMI, oral membranes moist. No nose bleeding seen Neck: supple Cardiovascular: RRR without murmur. No JVD    Respiratory/Chest: CTA Bilaterally without wheezes or rales. Normal effort    GI/Abdomen: BS +, non-tender, non-distended Ext: no clubbing, cyanosis, or edema Psych: pleasant and cooperative Skin: Warm and dry.  No breakdown Musc: No edema in extremities.  No tenderness in extremities. Neuro: Alert Motor: LUE/LE: 5/5 proximal  distal RUE: Shoulder abduction, elbow flexion/extension 4/5, wrist extension 3-/5, handgrip 3+/5 with tremor noted when fatiguing Right lower extremity: 4/5 hip flexion, knee extension, 2 /5 ankle dorsiflexion/PF   Vital Signs Blood pressure 123/67, pulse 91, temperature 98.6 F (37 C), resp. rate 16, height 5\' 5"  (1.651 m), weight 52.3 kg, last menstrual period 11/15/2008, SpO2 98 %.  Assessment/Plan: 1. Functional deficits which require 3+ hours per day of interdisciplinary therapy in a comprehensive inpatient rehab setting.  Physiatrist is providing close team supervision and 24 hour management of active medical problems listed below.  Physiatrist and rehab team continue to assess barriers to discharge/monitor patient progress toward functional and medical goals  Care Tool:  Bathing    Body parts bathed by patient: Chest,Abdomen,Right upper leg,Left upper leg,Right arm,Face,Front perineal area,Buttocks,Left arm,Left lower leg,Right lower leg   Body parts bathed by helper: Right lower leg,Left lower leg     Bathing assist Assist Level: Minimal Assistance - Patient > 75%     Upper Body Dressing/Undressing Upper body dressing   What is the patient wearing?: Pull over shirt,Bra    Upper body assist Assist Level: Minimal Assistance - Patient > 75%    Lower Body Dressing/Undressing Lower body dressing      What is the patient wearing?: Pants     Lower body assist Assist for lower body dressing: Minimal Assistance - Patient > 75%     Toileting Toileting    Toileting assist Assist for toileting: Minimal Assistance - Patient >  75%     Transfers Chair/bed transfer  Transfers assist     Chair/bed transfer assist level: Minimal Assistance - Patient > 75% Chair/bed transfer assistive device: Museum/gallery exhibitions officer assist      Assist level: Minimal Assistance - Patient > 75% Assistive device: Walker-rolling Max distance: 133ft   Walk 10  feet activity   Assist     Assist level: Minimal Assistance - Patient > 75% Assistive device: Walker-rolling   Walk 50 feet activity   Assist Walk 50 feet with 2 turns activity did not occur: Safety/medical concerns  Assist level: Minimal Assistance - Patient > 75% Assistive device: Walker-rolling    Walk 150 feet activity   Assist Walk 150 feet activity did not occur: Safety/medical concerns         Walk 10 feet on uneven surface  activity   Assist Walk 10 feet on uneven surfaces activity did not occur: Safety/medical concerns         Wheelchair     Assist Will patient use wheelchair at discharge?: Yes Type of Wheelchair: Manual Wheelchair activity did not occur: Safety/medical concerns  Wheelchair assist level: Supervision/Verbal cueing Max wheelchair distance: 20'    Wheelchair 50 feet with 2 turns activity    Assist    Wheelchair 50 feet with 2 turns activity did not occur: Safety/medical concerns   Assist Level: Supervision/Verbal cueing   Wheelchair 150 feet activity     Assist  Wheelchair 150 feet activity did not occur: Safety/medical concerns       Blood pressure 123/67, pulse 91, temperature 98.6 F (37 C), resp. rate 16, height 5\' 5"  (1.651 m), weight 52.3 kg, last menstrual period 11/15/2008, SpO2 98 %.  Medical Problem List and Plan: 1.  L frontal ICH secondary to recent starting of Coumadin- Coumadin on hold with R hemiparesis             Continue CIR  Continue WHO at night.   -extended LOS to capture neurological gains, improve safety at home 2.  Portal vein thrombosis/A fib/Antithrombotics: -DVT/anticoagulation:  Pharmaceutical: SCDs due to thrombocytopenia and recent bleed.  --per neuro no coumadin-->will need to wait at least one month due to size of bleed, patient to follow-up as outpatient with neurology  --dopplers ok  -resumed statin  per neuro recs, explained rationale for lipitor to patient. Discussed  potential side effects.              -antiplatelet therapy: N/A 3. Pain Management: tylenol prn.   - kpad for neck pain  -will replace robaxin with flexeril prn  - tramadol sparingly: q8H prn as per patient request. Prefers to keep at q8- continue at this time.    4. Mood: LCSW to follow up for evaluation and support.              -antipsychotic agents: N/A  -added trazodone qhs prn for sleep 5. Neuropsych: This patient is capable of making decisions on her own behalf. 6. Wound ulcer/Skin/Wound Care: Pack daily wound care LLE.    -turning, nutrition, education  -eucerin cream bid  -pruritus generally improved, can use prn's 7. Fluids/Electrolytes/Nutrition: Strict I/Os.  -changed renalvit to AM per pt request 8.  ESRD: Continue HD on TTS at the end of the day to help with therapy tolerance.  9. HTN: Monitor BP   Soft, but asymptomatic on 1/6-no changes  10. Pulmonary HTN: Oxygen dependent. Continue Ambrisentan and Selexipag. On Home dose 2L by [place]  11. Cirrhosis of the liver: Monitor platelets and for any signs of bleeding. Reporting abdominal distension.  (protal vein thrombus)  -f/u abd CT as outpt 12. Chronic systolic CHF: Monitor daily weights as well as I/O. Continue tadalafil.    Filed Weights   01/16/20 0500 01/17/20 0625 01/17/20 1400  Weight: 48.7 kg 48.5 kg 52.3 kg    -volume mgt per nephro  Decreased 1/5: continue to monitor daily 13. CAF: Monitor HR tid--metoprolol has been on hold. Coumadin to be held due to [place].  14. Pancytopenia:  Continue to monitor with serial checks.   Hemoglobin 11.8 on 1/2-->10.4 1/3--> 10.1 1/6  Platelets 116 on 1/2  --. 149 1/3---> 159 1/6  15. Constipation WITH hemorrhoids- seems to cycle between loose stool  -prn miralax only  -daily fiber packet  -encouraging regular solid/liquid intake as well  --resumed senna-s at bedtime 12/15  -Continue Anusol bid, sitz bath prn for hems  1/6 stools are generally soft to mushy. I think her perception  of constipation/impaction/pain could be spasm verse hem pain   -will try low dose levsin to see if that helps 16. Significant epistaxis (pt has prior history)  12/18-Dr. Benjamine Mola cauterized hypervascular areas in nasal septi  -still some mild bleeding at times  Follow-up with Dr. Benjamine Mola outpatient.  -continue saline nasal spray, prn afrin, astelin 17.  Sleep disturbance:   Continue 50mg  trazodone qhs prn  Improving overall 18. Right neck nodule appears resolved. Likely node 20. Sinus congestion: Continue Sudafed 21. Sore throat: Added lozenges and throat spray prn.  22. Ear fullness:  debrox ear drops BID stopped after 1 week   LOS: 31 days A FACE TO [place] 01/18/2020, 9:27 AM

## 2020-01-19 DIAGNOSIS — E43 Unspecified severe protein-calorie malnutrition: Secondary | ICD-10-CM | POA: Insufficient documentation

## 2020-01-19 LAB — RENAL FUNCTION PANEL
Albumin: 3.1 g/dL — ABNORMAL LOW (ref 3.5–5.0)
Anion gap: 16 — ABNORMAL HIGH (ref 5–15)
BUN: 50 mg/dL — ABNORMAL HIGH (ref 8–23)
CO2: 25 mmol/L (ref 22–32)
Calcium: 9.6 mg/dL (ref 8.9–10.3)
Chloride: 96 mmol/L — ABNORMAL LOW (ref 98–111)
Creatinine, Ser: 6.8 mg/dL — ABNORMAL HIGH (ref 0.44–1.00)
GFR, Estimated: 6 mL/min — ABNORMAL LOW (ref >60)
Glucose, Bld: 111 mg/dL — ABNORMAL HIGH (ref 70–99)
Phosphorus: 5.5 mg/dL — ABNORMAL HIGH (ref 2.5–4.6)
Potassium: 3.7 mmol/L (ref 3.5–5.1)
Sodium: 137 mmol/L (ref 135–145)

## 2020-01-19 LAB — CBC
HCT: 31.6 % — ABNORMAL LOW (ref 36.0–46.0)
Hemoglobin: 10.1 g/dL — ABNORMAL LOW (ref 12.0–15.0)
MCH: 29.6 pg (ref 26.0–34.0)
MCHC: 32 g/dL (ref 30.0–36.0)
MCV: 92.7 fL (ref 80.0–100.0)
Platelets: 183 10*3/uL (ref 150–400)
RBC: 3.41 MIL/uL — ABNORMAL LOW (ref 3.87–5.11)
RDW: 16.6 % — ABNORMAL HIGH (ref 11.5–15.5)
WBC: 4.9 10*3/uL (ref 4.0–10.5)
nRBC: 0 % (ref 0.0–0.2)

## 2020-01-19 LAB — HEPATITIS B CORE ANTIBODY, TOTAL: Hep B Core Total Ab: NONREACTIVE

## 2020-01-19 LAB — HEPATITIS B SURFACE ANTIGEN: Hepatitis B Surface Ag: NONREACTIVE

## 2020-01-19 NOTE — Plan of Care (Signed)
  Problem: Consults Goal: RH STROKE PATIENT EDUCATION Description: See Patient Education module for education specifics  Outcome: Progressing   Problem: RH BOWEL ELIMINATION Goal: RH STG MANAGE BOWEL WITH ASSISTANCE Description: STG Manage Bowel with [place]. Outcome: Progressing   Problem: RH SKIN INTEGRITY Goal: RH STG SKIN FREE OF INFECTION/BREAKDOWN Description: No new breakdown with min assist/cues  Outcome: Progressing Goal: RH STG ABLE TO PERFORM INCISION/WOUND CARE W/ASSISTANCE Description: STG Able To Perform Incision/Wound Care With Mod Assistance. Outcome: Progressing   Problem: RH COGNITION-NURSING Goal: RH STG ANTICIPATES NEEDS/CALLS FOR ASSIST W/ASSIST/CUES Description: STG Anticipates Needs/Calls for Assist With Min Assistance/Cues. Outcome: Progressing   Problem: RH PAIN MANAGEMENT Goal: RH STG PAIN MANAGED AT OR BELOW PT'S PAIN GOAL Description: < 4 out of 10.  Outcome: Progressing   Problem: RH KNOWLEDGE DEFICIT Goal: RH STG INCREASE KNOWLEDGE OF STROKE PROPHYLAXIS Description: Pt will demonstrate increased understanding of stroke prevention methods including chronic disease management, diet management, medication management, and follow up care with primary physician using the educational materials given while in rehab.  Outcome: Progressing

## 2020-01-19 NOTE — Progress Notes (Signed)
Patient's labs: CBC and RFP were drawn pre Hd and sent to the lab. Lab released CBC at 1600. However, lab did not release RFP. This RN called the lab at approximately 1700 to question reason for delay in RFP. At 1830, Lab finally released resutls for RFP with a K of 3.7 reported and this RN paged Dr. Joylene Grapes to explain the situation. Pt was placed on 3k 2.25 ca bath for remaining HD as per Dr. Joylene Grapes.

## 2020-01-19 NOTE — Progress Notes (Signed)
[place] PHYSICAL MEDICINE & REHABILITATION PROGRESS NOTE   Subjective/Complaints: Moving bowels regularly, soft to mushy stools. Remains absolutely fixated on being constipated with pain, ?spasms. Did not take levsin yesterday or this morning despite [place] discussing it extensively yesterday.   ROS: Patient denies fever, rash, sore throat, blurred vision, nausea, vomiting, diarrhea, cough, shortness of breath or chest pain,   headache, or mood change.   Objective:   No results found. Recent Labs    01/17/20 0448  WBC 4.2  HGB 10.1*  HCT 30.1*  PLT 159   Recent Labs    01/17/20 0448  NA 137  K 4.0  CL 96*  CO2 24  GLUCOSE 84  BUN 54*  CREATININE 7.05*  CALCIUM 9.6    Intake/Output Summary (Last 24 hours) at 01/19/2020 0844 Last data filed at 01/19/2020 0800 Gross per 24 hour  Intake 940 ml  Output --  Net 940 ml     Pressure Injury 12/18/19 Sacrum Medial Stage 1 -  Intact skin with non-blanchable redness of a localized area usually over a bony prominence. Red, non-blanchable spot over sacrum area (Active)  12/18/19 1707  Location: Sacrum  Location Orientation: Medial  Staging: Stage 1 -  Intact skin with non-blanchable redness of a localized area usually over a bony prominence.  Wound Description (Comments): Red, non-blanchable spot over sacrum area  Present on Admission: Yes   Physical Exam: Constitutional: No distress . Vital signs reviewed. HEENT: EOMI, oral membranes moist Neck: supple Cardiovascular: RRR without murmur. No JVD    Respiratory/Chest: CTA Bilaterally without wheezes or rales. Normal effort    GI/Abdomen: BS +, non-tender, non-distended Ext: no clubbing, cyanosis, or edema Psych: pleasant and cooperative Skin: Warm and dry.  No breakdown Musc: No edema in extremities.  No tenderness in extremities. Neuro: Alert Motor: LUE/LE: 5/5 proximal distal RUE: Shoulder abduction, elbow flexion/extension 4/5, wrist extension 3-/5, handgrip 3+/5 with  tremor noted when fatiguing Right lower extremity: 4/5 hip flexion, knee extension, 2/5 ankle dorsiflexion/PF   Vital Signs Blood pressure 124/70, pulse 96, temperature 98.6 F (37 C), temperature source Oral, resp. rate 18, height 5\' 5"  (1.651 m), weight 52.3 kg, last menstrual period 11/15/2008, SpO2 92 %.  Assessment/Plan: 1. Functional deficits which require 3+ hours per day of interdisciplinary therapy in a comprehensive inpatient rehab setting.  Physiatrist is providing close team supervision and 24 hour management of active medical problems listed below.  Physiatrist and rehab team continue to assess barriers to discharge/monitor patient progress toward functional and medical goals  Care Tool:  Bathing    Body parts bathed by patient: Chest,Abdomen,Right upper leg,Left upper leg,Right arm,Face,Front perineal area,Buttocks,Left arm,Left lower leg,Right lower leg   Body parts bathed by helper: Right lower leg,Left lower leg     Bathing assist Assist Level: Minimal Assistance - Patient > 75%     Upper Body Dressing/Undressing Upper body dressing   What is the patient wearing?: Pull over shirt,Bra    Upper body assist Assist Level: Minimal Assistance - Patient > 75%    Lower Body Dressing/Undressing Lower body dressing      What is the patient wearing?: Pants     Lower body assist Assist for lower body dressing: Minimal Assistance - Patient > 75%     Toileting Toileting    Toileting assist Assist for toileting: Minimal Assistance - Patient > 75%     Transfers Chair/bed transfer  Transfers assist     Chair/bed transfer assist level: Minimal Assistance -  Patient > 75% Chair/bed transfer assistive device: Museum/gallery exhibitions officer assist      Assist level: Minimal Assistance - Patient > 75% Assistive device: Parallel bars Max distance: 30'   Walk 10 feet activity   Assist     Assist level: Minimal Assistance - Patient >  75% Assistive device: Parallel bars   Walk 50 feet activity   Assist Walk 50 feet with 2 turns activity did not occur: Safety/medical concerns  Assist level: Minimal Assistance - Patient > 75% Assistive device: Walker-rolling    Walk 150 feet activity   Assist Walk 150 feet activity did not occur: Safety/medical concerns         Walk 10 feet on uneven surface  activity   Assist Walk 10 feet on uneven surfaces activity did not occur: Safety/medical concerns         Wheelchair     Assist Will patient use wheelchair at discharge?: Yes Type of Wheelchair: Manual Wheelchair activity did not occur: Safety/medical concerns  Wheelchair assist level: Supervision/Verbal cueing Max wheelchair distance: 20'    Wheelchair 50 feet with 2 turns activity    Assist    Wheelchair 50 feet with 2 turns activity did not occur: Safety/medical concerns   Assist Level: Supervision/Verbal cueing   Wheelchair 150 feet activity     Assist  Wheelchair 150 feet activity did not occur: Safety/medical concerns       Blood pressure 124/70, pulse 96, temperature 98.6 F (37 C), temperature source Oral, resp. rate 18, height 5\' 5"  (1.651 m), weight 52.3 kg, last menstrual period 11/15/2008, SpO2 92 %.  Medical Problem List and Plan: 1.  L frontal ICH secondary to recent starting of Coumadin- Coumadin on hold with R hemiparesis             Continue CIR  Continue WHO at night.   -extended LOS to 1/12 to capture neurological gains, improve safety at home 2.  Portal vein thrombosis/A fib/Antithrombotics: -DVT/anticoagulation:  Pharmaceutical: SCDs due to thrombocytopenia and recent bleed.  --per neuro no coumadin-->will need to wait at least one month due to size of bleed, patient to follow-up as outpatient with neurology  --dopplers ok  -resumed statin  per neuro recs, explained rationale for lipitor to patient. Discussed potential side effects.              -antiplatelet  therapy: N/A 3. Pain Management: tylenol prn.   - kpad for neck pain  -will replace robaxin with flexeril prn  - tramadol sparingly: q8H prn as per patient request. Prefers to keep at q8- continue at this time.    4. Mood: LCSW to follow up for evaluation and support.              -antipsychotic agents: N/A  -added trazodone qhs prn for sleep 5. Neuropsych: This patient is capable of making decisions on her own behalf. 6. Wound ulcer/Skin/Wound Care: Pack daily wound care LLE.    -turning, nutrition, education  -eucerin cream bid  -pruritus generally improved, can use prn's 7. Fluids/Electrolytes/Nutrition: Strict I/Os.  -changed renalvit to AM per pt request 8.  ESRD: Continue HD on TTS at the end of the day to help with therapy tolerance.  9. HTN: Monitor BP   Soft, but asymptomatic on 1/8  10. Pulmonary HTN: Oxygen dependent. Continue Ambrisentan and Selexipag. On Home dose 2L by [place] 11. Cirrhosis of the liver: Monitor platelets and for any signs of bleeding. Reporting abdominal distension.  (  protal vein thrombus)  -f/u abd CT as outpt 12. Chronic systolic CHF: Monitor daily weights as well as I/O. Continue tadalafil.    Filed Weights   01/16/20 0500 01/17/20 0625 01/17/20 1400  Weight: 48.7 kg 48.5 kg 52.3 kg    -volume mgt per nephro  Decreased 1/5: continue to monitor daily 13. CAF: Monitor HR tid--metoprolol has been on hold. Coumadin to be held due to [place].  14. Pancytopenia:  Continue to monitor with serial checks.   Hemoglobin 11.8 on 1/2-->10.4 1/3--> 10.1 1/6  Platelets 116 on 1/2  --. 149 1/3---> 159 1/6  -recheck monday  15. Constipation WITH hemorrhoids- seems to cycle between loose stool  -prn miralax only  -daily fiber packet  -encouraging regular solid/liquid intake as well  --resumed senna-s at bedtime 12/15  -Continue Anusol bid, sitz bath prn for hems  1/8 remains fixated on bowels. Did not take levsin despite the fact that I reviewed with her and specified  on orders "for bowel spasms". Reviewed with nurse and patient again today 16. Significant epistaxis (pt has prior history)  12/18-Dr. Benjamine Mola cauterized hypervascular areas in nasal septi  -still some mild bleeding at times  Follow-up with Dr. Benjamine Mola outpatient.  -continue saline nasal spray, prn afrin, astelin 17.  Sleep disturbance:   Continue 50mg  trazodone qhs prn  Improving overall 18. Right neck nodule appears resolved. Likely node 20. Sinus congestion: Continue Sudafed 21. Sore throat: Added lozenges and throat spray prn.  22. Ear fullness:  debrox ear drops BID stopped after 1 week   LOS: 32 days A FACE TO [place] 01/19/2020, 8:44 AM

## 2020-01-19 NOTE — Progress Notes (Signed)
[facility] Progress Note   Subjective:    No interval events  Objective Vitals:   01/18/20 0537 01/18/20 1731 01/18/20 1914 01/19/20 0620  BP: 123/67 116/68 114/71 124/70  Pulse: 91 95 93 96  Resp:  19 18   Temp:  98.4 F (36.9 C) 98.6 F (37 C)   TempSrc:  Oral Oral   SpO2: 98% 95% 93% 92%  Weight:      Height:       Physical Exam General: Thin, but well appearing woman, NAD. Room air Heart: RRR; no murmur Lungs: CTAB; no rales Abdomen: soft Extremities: No LE edema Dialysis Access:  LUE AVF + bruit  Additional Objective Labs: Basic Metabolic Panel: Recent Labs  Lab 01/12/20 1329 01/13/20 0528 01/15/20 0504 01/16/20 0550 01/17/20 0448  NA 134*   < > 132* 136 137  K 4.6   < > 4.3 3.7 4.0  CL 96*   < > 95* 98 96*  CO2 23   < > 22 25 24   GLUCOSE 75   < > 82 81 84  BUN 52*   < > 66* 33* 54*  CREATININE 7.26*   < > 8.24* 4.49* 7.05*  CALCIUM 9.3   < > 9.3 9.0 9.6  PHOS 5.5*  --   --   --   --    < > = values in this interval not displayed.   Liver Function Tests: Recent Labs  Lab 01/12/20 1329  ALBUMIN 3.0*   CBC: Recent Labs  Lab 01/13/20 0528 01/14/20 0608 01/15/20 0504 01/16/20 0550 01/17/20 0448  WBC 5.0 4.9 4.0 3.4* 4.2  HGB 11.8* 10.4* 10.1* 10.7* 10.1*  HCT 35.9* 32.8* 29.7* 32.3* 30.1*  MCV 93.2 92.4 90.3 91.0 90.7  PLT 116* 149* 156 126* 159   Medications:  . (feeding supplement) PROSource Plus  30 mL Oral BID BM  . ambrisentan  5 mg Oral Daily  . atorvastatin  40 mg Oral Daily  . calcium acetate  1,334 mg Oral TID WC  . camphor-menthol   Topical BID  . COVID-19 mRNA vaccine (Moderna)  0.25 mL Intramuscular Once  . darbepoetin (ARANESP) injection - DIALYSIS  40 mcg Intravenous Q Thu-HD  . feeding supplement (NEPRO CARB STEADY)  237 mL Oral BID BM  . hydrocerin   Topical BID  . loratadine  10 mg Oral Daily  . multivitamin  1 tablet Oral Q0600  . pentoxifylline  400 mg Oral Daily  . psyllium  1 packet Oral BID   . Selexipag  800 mcg Oral BID  . senna-docusate  1 tablet Oral BID  . sodium chloride  1 spray Each Nare 5 X Daily  . tadalafil  20 mg Oral Daily    Dialysis Orders: SW TTS 3.5h 450/600 2/2.25 bath 58.5kg P2 LUA AVF Hep none - Mircera 50 mcg IV q 4 weeks - Hectorol 6 mcg IV TIW - Sensipar 30 mg PO TIW  Assessment/Plan: 1. Intracerebral hemorrhage: Suspected to be related to microvascular disease andwarfarin - now stopped.Repeat CT scan of the head showing no extension of hematoma. Because she suffered significant neurological deficits of the right upper and lower extremities, she was admitted to CIR for intensive PT/OT.  Paresis improving 2. ESRD:Continue HD per TTS schedule - Next HD 1/8  3. Anemia:Hgb at goal- getting low dose Aranesp q Thurs 4. CKD-MBD:Ca improving, Phos ok.PTH 13 - stopped VDRA and sensipar.On Phoslo as binder - tried other binders and had issues swallowing them  and not interested in chewable binder. Will use low Ca bath with HD as well. 5. Nutrition:Alb low, continue Nepro supplements.Diet liberalized to regular with fluid restrictions. 6. Hypertension: BP stable without edema, she has lost quite a bit of body weight during prolonged hospitalization - needs new EDW.  Rexene Agent, MD [facility] 01/19/2020,9:09 AM

## 2020-01-19 NOTE — Progress Notes (Addendum)
Pt did not tolerate the UF goal, oxygen saturations dropped during HD, PA Larina Earthly was made aware, came to bedside and oxygen was applied with good results. BP also dropped below 000 systolic during HD multiple times, Larina Earthly, PA was at bedisde and ordered that UF be turned off  for remaining HD Tx. Post Hd Pt was stable and denied complaints. Unable to achieve UF goal as ordered.

## 2020-01-20 LAB — HEPATITIS B SURFACE ANTIBODY, QUANTITATIVE: Hep B S AB Quant (Post): 80.3 m[IU]/mL (ref >9.9)

## 2020-01-20 MED ORDER — DICYCLOMINE HCL 10 MG PO CAPS
10.0000 mg | ORAL_CAPSULE | Freq: Every day | ORAL | Status: DC
  Administered 2020-01-20 – 2020-01-22 (×3): 10 mg via ORAL
  Filled 2020-01-20 (×3): qty 1

## 2020-01-20 MED ORDER — SACCHAROMYCES BOULARDII 250 MG PO CAPS
250.0000 mg | ORAL_CAPSULE | Freq: Two times a day (BID) | ORAL | Status: DC
  Administered 2020-01-20 – 2020-01-23 (×7): 250 mg via ORAL
  Filled 2020-01-20 (×7): qty 1

## 2020-01-20 NOTE — Plan of Care (Signed)
  Problem: Consults Goal: RH STROKE PATIENT EDUCATION Description: See Patient Education module for education specifics  Outcome: Progressing   Problem: RH BOWEL ELIMINATION Goal: RH STG MANAGE BOWEL WITH ASSISTANCE Description: STG Manage Bowel with [place]. Outcome: Progressing   Problem: RH SKIN INTEGRITY Goal: RH STG SKIN FREE OF INFECTION/BREAKDOWN Description: No new breakdown with min assist/cues  Outcome: Progressing Goal: RH STG ABLE TO PERFORM INCISION/WOUND CARE W/ASSISTANCE Description: STG Able To Perform Incision/Wound Care With Mod Assistance. Outcome: Progressing   Problem: RH COGNITION-NURSING Goal: RH STG ANTICIPATES NEEDS/CALLS FOR ASSIST W/ASSIST/CUES Description: STG Anticipates Needs/Calls for Assist With Min Assistance/Cues. Outcome: Progressing   Problem: RH PAIN MANAGEMENT Goal: RH STG PAIN MANAGED AT OR BELOW PT'S PAIN GOAL Description: < 4 out of 10.  Outcome: Progressing   Problem: RH KNOWLEDGE DEFICIT Goal: RH STG INCREASE KNOWLEDGE OF STROKE PROPHYLAXIS Description: Pt will demonstrate increased understanding of stroke prevention methods including chronic disease management, diet management, medication management, and follow up care with primary physician using the educational materials given while in rehab.  Outcome: Progressing

## 2020-01-20 NOTE — Progress Notes (Signed)
[facility] Progress Note   Subjective:    No interval events, HD yesterday with little UF  Objective Vitals:   01/19/20 1852 01/19/20 1854 01/19/20 2029 01/20/20 0347  BP: (!) 104/53 (!) 102/55 (!) 99/56 125/78  Pulse: 85 87 91 91  Resp: 16  15 16   Temp: 98.6 F (37 C)  (!) 97.5 F (36.4 C) 98.8 F (37.1 C)  TempSrc: Oral  Oral   SpO2: 98%  95% 97%  Weight:      Height:       Physical Exam General: Thin, but well appearing woman, NAD. Room air Heart: RRR; no murmur Lungs: CTAB; no rales Abdomen: soft Extremities: No LE edema Dialysis Access:  LUE AVF + bruit  Additional Objective Labs: Basic Metabolic Panel: Recent Labs  Lab 01/16/20 0550 01/17/20 0448 01/19/20 1537  NA 136 137 137  K 3.7 4.0 3.7  CL 98 96* 96*  CO2 25 24 25   GLUCOSE 81 84 111*  BUN 33* 54* 50*  CREATININE 4.49* 7.05* 6.80*  CALCIUM 9.0 9.6 9.6  PHOS  --   --  5.5*   Liver Function Tests: Recent Labs  Lab 01/19/20 1537  ALBUMIN 3.1*   CBC: Recent Labs  Lab 01/14/20 0608 01/15/20 0504 01/16/20 0550 01/17/20 0448 01/19/20 1536  WBC 4.9 4.0 3.4* 4.2 4.9  HGB 10.4* 10.1* 10.7* 10.1* 10.1*  HCT 32.8* 29.7* 32.3* 30.1* 31.6*  MCV 92.4 90.3 91.0 90.7 92.7  PLT 149* 156 126* 159 183   Medications:  . (feeding supplement) PROSource Plus  30 mL Oral BID BM  . ambrisentan  5 mg Oral Daily  . atorvastatin  40 mg Oral Daily  . calcium acetate  1,334 mg Oral TID WC  . camphor-menthol   Topical BID  . COVID-19 mRNA vaccine (Moderna)  0.25 mL Intramuscular Once  . darbepoetin (ARANESP) injection - DIALYSIS  40 mcg Intravenous Q Thu-HD  . dicyclomine  10 mg Oral Q supper  . feeding supplement (NEPRO CARB STEADY)  237 mL Oral BID BM  . hydrocerin   Topical BID  . loratadine  10 mg Oral Daily  . multivitamin  1 tablet Oral Q0600  . pentoxifylline  400 mg Oral Daily  . psyllium  1 packet Oral BID  . saccharomyces boulardii  250 mg Oral BID  . Selexipag  800 mcg Oral  BID  . sodium chloride  1 spray Each Nare 5 X Daily  . tadalafil  20 mg Oral Daily    Dialysis Orders: SW TTS 3.5h 450/600 2/2.25 bath 58.5kg P2 LUA AVF Hep none - Mircera 50 mcg IV q 4 weeks - Hectorol 6 mcg IV TIW - Sensipar 30 mg PO TIW  Assessment/Plan: 1.  S/p Intracerebral hemorrhage: Suspected to be related to microvascular disease andwarfarin - now stopped.Because she suffered significant neurological deficits of the right upper and lower extremities, she was admitted to CIR for intensive PT/OT.  Paresis improving.  Anticipated DC is 01/23/20 2. ESRD:Continue HD per TTS schedule - Next HD 1/11 3. Anemia:Hgb at goal- getting low dose Aranesp q Thurs 4. CKD-MBD:Ca improving, Phos ok.PTH 13 - stopped VDRA and sensipar.On Phoslo as binder - tried other binders and had issues swallowing them and not interested in chewable binder. Will use low Ca bath with HD as well. 5. Nutrition:Alb low, continue Nepro supplements.Diet liberalized to regular with fluid restrictions. 6. Hypertension: BP stable without edema, she has lost quite a bit of body weight during prolonged  hospitalization - needs new EDW.  Prob is near EDW, sig hypotension fatigue with attempts to push post-weights down.   Rexene Agent, MD [facility] 01/20/2020,1:11 PM

## 2020-01-20 NOTE — Progress Notes (Signed)
[place] PHYSICAL MEDICINE & REHABILITATION PROGRESS NOTE   Subjective/Complaints: Moving bowels regularly, soft to mushy stools. Remains absolutely fixated on being constipated with pain, ?spasms. Did not take levsin yesterday or this morning despite [place] discussing it extensively yesterday.   ROS: Patient denies fever, rash, sore throat, blurred vision, nausea, vomiting, diarrhea, cough, shortness of breath or chest pain,   headache, or mood change.   Objective:   No results found. Recent Labs    01/19/20 1536  WBC 4.9  HGB 10.1*  HCT 31.6*  PLT 183   Recent Labs    01/19/20 1537  NA 137  K 3.7  CL 96*  CO2 25  GLUCOSE 111*  BUN 50*  CREATININE 6.80*  CALCIUM 9.6    Intake/Output Summary (Last 24 hours) at 01/20/2020 0835 Last data filed at 01/19/2020 3664 Gross per 24 hour  Intake 220 ml  Output 271 ml  Net -51 ml     Pressure Injury 12/18/19 Sacrum Medial Stage 1 -  Intact skin with non-blanchable redness of a localized area usually over a bony prominence. Red, non-blanchable spot over sacrum area (Active)  12/18/19 1707  Location: Sacrum  Location Orientation: Medial  Staging: Stage 1 -  Intact skin with non-blanchable redness of a localized area usually over a bony prominence.  Wound Description (Comments): Red, non-blanchable spot over sacrum area  Present on Admission: Yes   Physical Exam: Constitutional: No distress . Vital signs reviewed. HEENT: EOMI, oral membranes moist Neck: supple Cardiovascular: RRR without murmur. No JVD    Respiratory/Chest: CTA Bilaterally without wheezes or rales. Normal effort    GI/Abdomen: BS +, non-tender, non-distended Ext: no clubbing, cyanosis, or edema Psych: pleasant and cooperative Skin: Warm and dry.  No breakdown Musc: No edema in extremities.  No tenderness in extremities. Neuro: Alert Motor: LUE/LE: 5/5 proximal distal RUE: Shoulder abduction, elbow flexion/extension 4/5, wrist extension 3-/5, handgrip 3+/5  with tremor noted when fatiguing Right lower extremity: 4/5 hip flexion, knee extension, 2/5 ankle dorsiflexion/PF   Vital Signs Blood pressure 125/78, pulse 91, temperature 98.8 F (37.1 C), resp. rate 16, height 5\' 5"  (1.651 m), weight 52.3 kg, last menstrual period 11/15/2008, SpO2 97 %.  Assessment/Plan: 1. Functional deficits which require 3+ hours per day of interdisciplinary therapy in a comprehensive inpatient rehab setting.  Physiatrist is providing close team supervision and 24 hour management of active medical problems listed below.  Physiatrist and rehab team continue to assess barriers to discharge/monitor patient progress toward functional and medical goals  Care Tool:  Bathing    Body parts bathed by patient: Chest,Abdomen,Right upper leg,Left upper leg,Right arm,Face,Front perineal area,Buttocks,Left arm,Left lower leg,Right lower leg   Body parts bathed by helper: Right lower leg,Left lower leg     Bathing assist Assist Level: Minimal Assistance - Patient > 75%     Upper Body Dressing/Undressing Upper body dressing   What is the patient wearing?: Pull over shirt,Bra    Upper body assist Assist Level: Minimal Assistance - Patient > 75%    Lower Body Dressing/Undressing Lower body dressing      What is the patient wearing?: Pants     Lower body assist Assist for lower body dressing: Minimal Assistance - Patient > 75%     Toileting Toileting    Toileting assist Assist for toileting: Minimal Assistance - Patient > 75%     Transfers Chair/bed transfer  Transfers assist     Chair/bed transfer assist level: Minimal Assistance - Patient >  75% Chair/bed transfer assistive device: Museum/gallery exhibitions officer assist      Assist level: Minimal Assistance - Patient > 75% Assistive device: Parallel bars Max distance: 30'   Walk 10 feet activity   Assist     Assist level: Minimal Assistance - Patient > 75% Assistive  device: Parallel bars   Walk 50 feet activity   Assist Walk 50 feet with 2 turns activity did not occur: Safety/medical concerns  Assist level: Minimal Assistance - Patient > 75% Assistive device: Walker-rolling    Walk 150 feet activity   Assist Walk 150 feet activity did not occur: Safety/medical concerns         Walk 10 feet on uneven surface  activity   Assist Walk 10 feet on uneven surfaces activity did not occur: Safety/medical concerns         Wheelchair     Assist Will patient use wheelchair at discharge?: Yes Type of Wheelchair: Manual Wheelchair activity did not occur: Safety/medical concerns  Wheelchair assist level: Supervision/Verbal cueing Max wheelchair distance: 20'    Wheelchair 50 feet with 2 turns activity    Assist    Wheelchair 50 feet with 2 turns activity did not occur: Safety/medical concerns   Assist Level: Supervision/Verbal cueing   Wheelchair 150 feet activity     Assist  Wheelchair 150 feet activity did not occur: Safety/medical concerns       Blood pressure 125/78, pulse 91, temperature 98.8 F (37.1 C), resp. rate 16, height 5\' 5"  (1.651 m), weight 52.3 kg, last menstrual period 11/15/2008, SpO2 97 %.  Medical Problem List and Plan: 1.  L frontal ICH secondary to recent starting of Coumadin- Coumadin on hold with R hemiparesis             Continue CIR  Continue WHO at night.   -extended LOS to 1/12 to capture neurological gains, improve safety at home 2.  Portal vein thrombosis/A fib/Antithrombotics: -DVT/anticoagulation:  Pharmaceutical: SCDs due to thrombocytopenia and recent bleed.  --per neuro no coumadin-->will need to wait at least one month due to size of bleed, patient to follow-up as outpatient with neurology  --dopplers ok  -resumed statin  per neuro recs, explained rationale for lipitor to patient. Discussed potential side effects.              -antiplatelet therapy: N/A 3. Pain Management:  tylenol prn.   - kpad for neck pain  -will replace robaxin with flexeril prn  - tramadol sparingly: q8H prn as per patient request. Prefers to keep at q8- continue at this time.    4. Mood: LCSW to follow up for evaluation and support.              -antipsychotic agents: N/A  -added trazodone qhs prn for sleep 5. Neuropsych: This patient is capable of making decisions on her own behalf. 6. Wound ulcer/Skin/Wound Care: Pack daily wound care LLE.    -turning, nutrition, education  -eucerin cream bid  -pruritus generally improved, can use prn's 7. Fluids/Electrolytes/Nutrition: Strict I/Os.  -changed renalvit to AM per pt request 8.  ESRD: Continue HD on TTS at the end of the day to help with therapy tolerance.  9. HTN: Monitor BP   Soft, but asymptomatic on 1/8  10. Pulmonary HTN: Oxygen dependent. Continue Ambrisentan and Selexipag. On Home dose 2L by [place] 11. Cirrhosis of the liver: Monitor platelets and for any signs of bleeding. Reporting abdominal distension.  (protal vein thrombus)  -  f/u abd CT as outpt 12. Chronic systolic CHF: Monitor daily weights as well as I/O. Continue tadalafil.    Filed Weights   01/16/20 0500 01/17/20 0625 01/17/20 1400  Weight: 48.7 kg 48.5 kg 52.3 kg    -volume mgt per nephro  Decreased 1/5: continue to monitor daily 13. CAF: Monitor HR tid--metoprolol has been on hold. Coumadin to be held due to [place].  14. Pancytopenia:  Continue to monitor with serial checks.   Hemoglobin 11.8 on 1/2-->10.4 1/3--> 10.1 1/6  Platelets 116 on 1/2  --. 149 1/3---> 159 1/6  -recheck monday  15. Constipation WITH hemorrhoids- seems to cycle between loose stool  -prn miralax only  -daily fiber packet  -encouraging regular solid/liquid intake as well  --resumed senna-s at bedtime 12/15  -Continue Anusol bid, sitz bath prn for hems  1/9 frequent soft stool with associated pain, rectal cramping.    -levsin without a lot of benefit   -may have IBS   -add probiotic bid  and daily bentyl with supper   -dc senna-s 16. Significant epistaxis (pt has prior history)  12/18-Dr. Benjamine Mola cauterized hypervascular areas in nasal septi  -still some mild bleeding at times  Follow-up with Dr. Benjamine Mola outpatient.  -continue saline nasal spray, prn afrin, astelin 17.  Sleep disturbance:   Continue 50mg  trazodone qhs prn  Improving overall 18. Right neck nodule appears resolved. Likely node 20. Sinus congestion: Continue Sudafed 21. Sore throat: Added lozenges and throat spray prn.  22. Ear fullness:  debrox ear drops BID stopped after 1 week   LOS: 33 days A FACE TO [place] 01/20/2020, 8:35 AM

## 2020-01-21 ENCOUNTER — Inpatient Hospital Stay (HOSPITAL_COMMUNITY): Payer: Medicare Other | Admitting: Occupational Therapy

## 2020-01-21 ENCOUNTER — Inpatient Hospital Stay (HOSPITAL_COMMUNITY): Payer: Medicare Other

## 2020-01-21 ENCOUNTER — Inpatient Hospital Stay (HOSPITAL_COMMUNITY): Payer: Medicare Other | Admitting: Physical Therapy

## 2020-01-21 MED ORDER — DOCUSATE SODIUM 100 MG PO CAPS
100.0000 mg | ORAL_CAPSULE | Freq: Every day | ORAL | Status: DC
  Administered 2020-01-21 – 2020-01-23 (×3): 100 mg via ORAL
  Filled 2020-01-21 (×3): qty 1

## 2020-01-21 NOTE — Progress Notes (Signed)
[facility] PHYSICAL MEDICINE & REHABILITATION PROGRESS NOTE   Subjective/Complaints: C/o small hard BM- she requests that colace be restarted She feels ready to go home tomorrow- asks questions about d/c BP dropped during dialysis today but she felt asymptomatic.   ROS: Patient denies fever, rash, sore throat, blurred vision, nausea, vomiting, diarrhea, cough, shortness of breath or chest pain, headache, or mood change. +constipation  Objective:   No results found. Recent Labs    01/19/20 1536  WBC 4.9  HGB 10.1*  HCT 31.6*  PLT 183   Recent Labs    01/19/20 1537  NA 137  K 3.7  CL 96*  CO2 25  GLUCOSE 111*  BUN 50*  CREATININE 6.80*  CALCIUM 9.6    Intake/Output Summary (Last 24 hours) at 01/21/2020 1244 Last data filed at 01/21/2020 4970 Gross per 24 hour  Intake 440 ml  Output -  Net 440 ml     Pressure Injury 12/18/19 Sacrum Medial Stage 1 -  Intact skin with non-blanchable redness of a localized area usually over a bony prominence. Red, non-blanchable spot over sacrum area (Active)  12/18/19 1707  Location: Sacrum  Location Orientation: Medial  Staging: Stage 1 -  Intact skin with non-blanchable redness of a localized area usually over a bony prominence.  Wound Description (Comments): Red, non-blanchable spot over sacrum area  Present on Admission: Yes   Physical Exam: Gen: no distress, normal appearing HEENT: oral mucosa pink and moist, NCAT Cardio: Reg rate Chest: normal effort, normal rate of breathing Abd: soft, non-distended Ext: no edema Skin: intact Psych: pleasant and cooperative Skin: Warm and dry.  No breakdown Musc: No edema in extremities.  No tenderness in extremities. Neuro: Alert Motor: LUE/LE: 5/5 proximal distal RUE: Shoulder abduction, elbow flexion/extension 4/5, wrist extension 3-/5, handgrip 3+/5 with tremor noted when fatiguing Right lower extremity: 4/5 hip flexion, knee extension, 2/5 ankle dorsiflexion/PF   Vital  Signs Blood pressure (!) 100/58, pulse 87, temperature 97.8 F (36.6 C), temperature source Oral, resp. rate 20, height 5\' 5"  (1.651 m), weight 53.2 kg, last menstrual period 11/15/2008, SpO2 94 %.  Assessment/Plan: 1. Functional deficits which require 3+ hours per day of interdisciplinary therapy in a comprehensive inpatient rehab setting.  Physiatrist is providing close team supervision and 24 hour management of active medical problems listed below.  Physiatrist and rehab team continue to assess barriers to discharge/monitor patient progress toward functional and medical goals  Care Tool:  Bathing    Body parts bathed by patient: Chest,Abdomen,Right upper leg,Left upper leg,Right arm,Face,Front perineal area,Buttocks,Left arm,Left lower leg,Right lower leg   Body parts bathed by helper: Right lower leg,Left lower leg     Bathing assist Assist Level: Minimal Assistance - Patient > 75%     Upper Body Dressing/Undressing Upper body dressing   What is the patient wearing?: Bra,Pull over shirt    Upper body assist Assist Level: Minimal Assistance - Patient > 75%    Lower Body Dressing/Undressing Lower body dressing      What is the patient wearing?: Pants,Incontinence brief     Lower body assist Assist for lower body dressing: Minimal Assistance - Patient > 75%     Toileting Toileting    Toileting assist Assist for toileting: Minimal Assistance - Patient > 75%     Transfers Chair/bed transfer  Transfers assist     Chair/bed transfer assist level: Contact Guard/Touching assist Chair/bed transfer assistive device: Programmer, multimedia   Ambulation assist  Assist level: Minimal Assistance - Patient > 75% Assistive device: Walker-rolling Max distance: 100'   Walk 10 feet activity   Assist     Assist level: Minimal Assistance - Patient > 75% Assistive device: Walker-rolling   Walk 50 feet activity   Assist Walk 50 feet with 2 turns  activity did not occur: Safety/medical concerns  Assist level: Minimal Assistance - Patient > 75% Assistive device: Walker-rolling    Walk 150 feet activity   Assist Walk 150 feet activity did not occur: Safety/medical concerns         Walk 10 feet on uneven surface  activity   Assist Walk 10 feet on uneven surfaces activity did not occur: Safety/medical concerns         Wheelchair     Assist Will patient use wheelchair at discharge?: Yes Type of Wheelchair: Manual Wheelchair activity did not occur: Safety/medical concerns  Wheelchair assist level: Supervision/Verbal cueing Max wheelchair distance: 20'    Wheelchair 50 feet with 2 turns activity    Assist    Wheelchair 50 feet with 2 turns activity did not occur: Safety/medical concerns   Assist Level: Supervision/Verbal cueing   Wheelchair 150 feet activity     Assist  Wheelchair 150 feet activity did not occur: Safety/medical concerns       Blood pressure (!) 100/58, pulse 87, temperature 97.8 F (36.6 C), temperature source Oral, resp. rate 20, height 5\' 5"  (1.651 m), weight 53.2 kg, last menstrual period 11/15/2008, SpO2 94 %.  Medical Problem List and Plan: 1.  L frontal ICH secondary to recent starting of Coumadin- Coumadin on hold with R hemiparesis            -Continue CIR  Continue WHO at night.   -extended LOS to 1/12 to capture neurological gains, improve safety at home 2.  Portal vein thrombosis/A fib/Antithrombotics: -DVT/anticoagulation:  Pharmaceutical: SCDs due to thrombocytopenia and recent bleed.  --per neuro no coumadin-->will need to wait at least one month due to size of bleed, patient to follow-up as outpatient with neurology --dopplers ok -resumed statin  per neuro recs, explained rationale for lipitor to patient. Discussed potential side effects.              -antiplatelet therapy: N/A 3. Pain Management: tylenol prn.   - kpad for neck pain  -will replace robaxin with  flexeril prn  - tramadol sparingly: q8H prn as per patient request. Prefers to keep at q8- continue at this time. Discussed outpatient management and she said her PCP would prescribe- she is using sparingly.  4. Mood: LCSW to follow up for evaluation and support.              -antipsychotic agents: N/A  -added trazodone qhs prn for sleep 5. Neuropsych: This patient is capable of making decisions on her own behalf. 6. Wound ulcer/Skin/Wound Care: Pack daily wound care LLE.    -turning, nutrition, education  -eucerin cream bid  -pruritus generally improved, can use prn's 7. Fluids/Electrolytes/Nutrition: Strict I/Os.  -changed renalvit to AM per pt request 8.  ESRD: Continue HD on TTS at the end of the day to help with therapy tolerance.  9. HTN: Monitor BP   Soft, but asymptomatic on 1/10- monitor outpatient  10. Pulmonary HTN: Oxygen dependent. Continue Ambrisentan and Selexipag. On Home dose 2L by [place] 11. Cirrhosis of the liver: Monitor platelets and for any signs of bleeding. Reporting abdominal distension.  (protal vein thrombus)  -f/u abd CT as outpt 12.  Chronic systolic CHF: Monitor daily weights as well as I/O. Continue tadalafil.    Filed Weights   01/17/20 0625 01/17/20 1400 01/21/20 0648  Weight: 48.5 kg 52.3 kg 53.2 kg    -volume mgt per nephro  Decreased 1/5: continue to monitor daily 13. CAF: Monitor HR tid--metoprolol has been on hold. Coumadin to be held due to [place].  14. Pancytopenia:  Continue to monitor with serial checks.   Hemoglobin 11.8 on 1/2-->10.4 1/3--> 10.1 1/6  Platelets 116 on 1/2  --. 149 1/3---> 159 1/6  -recheck monday  15. Constipation WITH hemorrhoids- seems to cycle between loose stool  -prn miralax only  -daily fiber packet  -encouraging regular solid/liquid intake as well  --resumed senna-s at bedtime 12/15  -Continue Anusol bid, sitz bath prn for hems  1/9 frequent soft stool with associated pain, rectal cramping.    -levsin without a lot of  benefit   -may have IBS   -add probiotic bid and daily bentyl with supper   -1/10: patient requests colace be restarted. Discussed adding senna at night if she still has constipation at home.  16. Significant epistaxis (pt has prior history)  12/18-Dr. Benjamine Mola cauterized hypervascular areas in nasal septi  -still some mild bleeding at times  Follow-up with Dr. Benjamine Mola outpatient.  -continue saline nasal spray, prn afrin, astelin 17.  Sleep disturbance:   Continue 50mg  trazodone qhs prn  Improving overall 18. Right neck nodule appears resolved. Likely node 20. Sinus congestion: Continue Sudafed 21. Sore throat: Added lozenges and throat spray prn.  22. Ear fullness:  debrox ear drops BID stopped after 1 week   LOS: 34 days A FACE TO FACE EVALUATION WAS PERFORMED  Katie Nunez 01/21/2020, 12:44 PM

## 2020-01-21 NOTE — Progress Notes (Signed)
Patient ID: Katie Nunez, female   DOB: [birth year], 65 y.o.   MRN: [id number]  SW received updates from [facility] reporting pt HHPT/OT/aide/?SN referral accepted.   SW updated pt on above.   Loralee Pacas, MSW, [place] Office: [phone] Cell: [phone] Fax: [phone]

## 2020-01-21 NOTE — Progress Notes (Signed)
Physical Therapy Session Note  Patient Details  Name: Katie Nunez MRN: [id number] Date of Birth: Aug 12, 1955  Today's Date: 01/21/2020 PT Individual Time: 1000-1045 PT Individual Time Calculation (min): 45 min   Short Term Goals: Week 4:  PT Short Term Goal 1 (Week 4): STGs=LTGs  Skilled Therapeutic Interventions/Progress Updates:    Pt received seated in w/c in room, agreeable to PT session. No complaints of pain. Sit to stand with Supervision to RW. Ambulation x 120 ft with RW with min A, R knee hyperextension and decreased DF noted especially with onset of fatigue. Pt also exhibits decreased control of RLE during gait with scissoring of limb. Standing alt L/R 4" step taps progressing to cone taps with RW and CGA for balance, decreased control of R knee in stance. Standing mini-squats 2 x 10 reps with RW and min A for balance with multimodal cueing for correct exercise performance for LE strengthening. Standing R knee TKE 2 x 10 reps with yellow theraband with RW and CGA for balance, multimodal cueing for correct exercise performance. Pt left seated in w/c in room with needs in reach at end of session.  Therapy Documentation Precautions:  Precautions Precautions: Fall Precaution Comments: Rt hemiplegia - subluxation Restrictions Weight Bearing Restrictions: No   Therapy/Group: Individual Therapy   Excell Seltzer, PT, DPT  01/21/2020, 12:25 PM

## 2020-01-21 NOTE — Progress Notes (Signed)
[facility] Progress Note   Subjective:   Patient seen and examined at bedside.  No new complaints. Reports BP drop again at HD but remained asymptomatic.  Denies CP, SOB, n/v/d  Objective Vitals:   01/20/20 1934 01/21/20 0519 01/21/20 0648 01/21/20 1533  BP: 120/65 (!) 100/58  124/61  Pulse: 85 87  83  Resp: 18 20  15   Temp: 98.6 F (37 C) 97.8 F (36.6 C)  (!) 97.5 F (36.4 C)  TempSrc:  Oral    SpO2: 94% 94%  90%  Weight:   53.2 kg   Height:       Physical Exam General:well appearing thin female in NAD Heart:RRR Lungs:CTAB Abdomen:soft, NTND Extremities:no LE edema Dialysis Access: LU AVF +b   Filed Weights   01/17/20 0625 01/17/20 1400 01/21/20 0648  Weight: 48.5 kg 52.3 kg 53.2 kg    Intake/Output Summary (Last 24 hours) at 01/21/2020 1643 Last data filed at 01/21/2020 1300 Gross per 24 hour  Intake 680 ml  Output --  Net 680 ml    Additional Objective Labs: Basic Metabolic Panel: Recent Labs  Lab 01/16/20 0550 01/17/20 0448 01/19/20 1537  NA 136 137 137  K 3.7 4.0 3.7  CL 98 96* 96*  CO2 25 24 25   GLUCOSE 81 84 111*  BUN 33* 54* 50*  CREATININE 4.49* 7.05* 6.80*  CALCIUM 9.0 9.6 9.6  PHOS  --   --  5.5*   Liver Function Tests: Recent Labs  Lab 01/19/20 1537  ALBUMIN 3.1*   CBC: Recent Labs  Lab 01/15/20 0504 01/16/20 0550 01/17/20 0448 01/19/20 1536  WBC 4.0 3.4* 4.2 4.9  HGB 10.1* 10.7* 10.1* 10.1*  HCT 29.7* 32.3* 30.1* 31.6*  MCV 90.3 91.0 90.7 92.7  PLT 156 126* 159 183   Studies/Results: No results found.  Medications:  . (feeding supplement) PROSource Plus  30 mL Oral BID BM  . ambrisentan  5 mg Oral Daily  . atorvastatin  40 mg Oral Daily  . calcium acetate  1,334 mg Oral TID WC  . camphor-menthol   Topical BID  . COVID-19 mRNA vaccine (Moderna)  0.25 mL Intramuscular Once  . darbepoetin (ARANESP) injection - DIALYSIS  40 mcg Intravenous Q Thu-HD  . dicyclomine  10 mg Oral Q supper  . docusate  sodium  100 mg Oral Daily  . feeding supplement (NEPRO CARB STEADY)  237 mL Oral BID BM  . hydrocerin   Topical BID  . loratadine  10 mg Oral Daily  . multivitamin  1 tablet Oral Q0600  . pentoxifylline  400 mg Oral Daily  . psyllium  1 packet Oral BID  . saccharomyces boulardii  250 mg Oral BID  . Selexipag  800 mcg Oral BID  . sodium chloride  1 spray Each Nare 5 X Daily  . tadalafil  20 mg Oral Daily    Dialysis Orders: SW TTS 3.5h 450/600 2/2.25 bath 58.5kg P2 LUA AVF Hep none - Mircera 50 mcg IV q 4 weeks - Hectorol 6 mcg IV TIW - Sensipar 30 mg PO TIW  Assessment/Plan: 1.  S/p Intracerebral hemorrhage: Suspected to be related to microvascular disease andwarfarin - now stopped.Because she suffered significant neurological deficits of the right upper and lower extremities, she was admitted to CIR for intensive PT/OT. Paresis improving.  Anticipated DC is 01/23/20 2. ESRD:On HD TTS.  Next HD tomorrow per regular schedule.  3. Anemia:Hgb 10.1 - getting low dose Aranesp q Thurs 4. CKD-MBD:Ca improving,  Phos ok.PTH 13 - stopped VDRA and sensipar.On Phoslo as binder -tried other binders and had issues swallowing them and not interested in chewable binder. Will use low Ca bath with HD as well. 5. Nutrition:Alb low, continue Nepro supplements.Diet liberalized to regular with fluid restrictions. 6. Hypertension: BP stable without edema, she has lost quite a bit of body weight during prolonged hospitalization - needs new EDW.  Prob is near EDW now, has had significant hypotension, fatigue with attempts to push post weights down.   Jen Mow, PA-C [facility] 01/21/2020,4:43 PM  LOS: 34 days

## 2020-01-21 NOTE — Progress Notes (Signed)
01/09/2 Patient continue to refuse her SCD's stating to writer she had them on earlier today and recently took them off because they are to hot and uncomfortable to wear". Again educated patient on purpose and side effect of not wearing her SCD's, also patient hesitate in wearing her right hand splint, will only keep splint on for short intervals of time.

## 2020-01-21 NOTE — Progress Notes (Signed)
Occupational Therapy Session Note  Patient Details  Name: Katie Nunez MRN: [id number] Date of Birth: 1955/08/01  Today's Date: 01/21/2020 OT Individual Time: 0757-0900 OT Individual Time Calculation (min): 63 min    Short Term Goals: Week 3:  OT Short Term Goal 1 (Week 3): LTG=STG 2/2 ELOS Week 4:  OT Short Term Goal 1 (Week 4): LTG=STG 2/2 ELOS Week 5:  OT Short Term Goal 1 (Week 5): LTG=STG 2.2 ELOS  Skilled Therapeutic Interventions/Progress Updates:    Patient seated on toilet, continent of small BM.  She is able to complete hygiene with set up and CS.  SPT to/from toilet, w/c, mat table with CGA/CS.  Completed grooming w/c level with set up.  UB dressing min A, LB dressing min A, min A for socks and max A for shoes.   She attempted to tie shoes with support of footstool but unable at this time - reviewed options and provided elastic laces - she is able to donn sneakers with min A.  Completed unsupported sitting trunk control/posture, UB stretch and relaxation techniques, reviewed light massage and hand stretches with good carryover.  She remained seated in w/c at close of session, nurse tech changing linens and to set up with call bell and tray table when she is finished.    Therapy Documentation Precautions:  Precautions Precautions: Fall Precaution Comments: Rt hemiplegia - subluxation Restrictions Weight Bearing Restrictions: No   Therapy/Group: Individual Therapy  Carlos Levering 01/21/2020, 7:32 AM

## 2020-01-21 NOTE — Progress Notes (Signed)
Physical Therapy Session Note  Patient Details  Name: Katie Nunez MRN: [id number] Date of Birth: 1955-09-17  Today's Date: 01/21/2020 PT Individual Time: 1350-1504 PT Individual Time Calculation (min): 74 min   Short Term Goals: Week 4:  PT Short Term Goal 1 (Week 4): STGs=LTGs  Skilled Therapeutic Interventions/Progress Updates:    Patient in w/c in room reports no therapy over the weekend, but did walk to bathroom.  States trying to eat more and MD liberalized diet to help.  Still feeling tired.  Patient assisted in w/c to therapy gym.  Performed sit to stand with CGA and ambulated to mat 5' with RW and R hand splint.  Patient sit to supine with S assisted with wedge due to feeling too full laying flat.  PAtient performed hip & knee flex/ext for coordination, timeing, position sense on mat then off the edge of the mat to 6" step.  Patient bridging x 10 cues for R knee position.  Sit to stand with forced use on R 6" step under L and assist/cues for R weight shift with both hands on R knee initially max A progressing to mod A x 6 reps, then both feet on floor with R lateral lean for increased R use x 4.  Patient performed standing step taps with L to 6" step with 1 UE support for R stance stability.  Stepping over and back stick on the floor with UE support for R LE coordination, stride length and stability in stance.  Patient ambulated x 57' with RW and CGA-min A when fatigued due to R foot dragging.  Negotiated 4 steps with 1-2 rails and min A cues for sequence.  Patient ambulated with AFO on R x 30' with RW and min A still with R foot dragging.  Discussed not as helpful but might benefit from leather toe cap for shoe.  Patient assisted back to room in w/c and left with call bell and needs in reach.   Therapy Documentation Precautions:  Precautions Precautions: Fall Precaution Comments: Rt hemiplegia - subluxation Restrictions Weight Bearing Restrictions: No Pain: Pain Assessment Pain  Scale: 0-10 Pain Score: 0-No pain    Therapy/Group: Individual Therapy  Reginia Naas  Magda Kiel, PT 01/21/2020, 2:33 PM

## 2020-01-22 ENCOUNTER — Inpatient Hospital Stay (HOSPITAL_COMMUNITY): Payer: Medicare Other

## 2020-01-22 ENCOUNTER — Inpatient Hospital Stay (HOSPITAL_COMMUNITY): Payer: Medicare Other | Admitting: Occupational Therapy

## 2020-01-22 LAB — RENAL FUNCTION PANEL
Albumin: 3 g/dL — ABNORMAL LOW (ref 3.5–5.0)
Anion gap: 16 — ABNORMAL HIGH (ref 5–15)
BUN: 60 mg/dL — ABNORMAL HIGH (ref 8–23)
CO2: 23 mmol/L (ref 22–32)
Calcium: 9.4 mg/dL (ref 8.9–10.3)
Chloride: 92 mmol/L — ABNORMAL LOW (ref 98–111)
Creatinine, Ser: 7.96 mg/dL — ABNORMAL HIGH (ref 0.44–1.00)
GFR, Estimated: 5 mL/min — ABNORMAL LOW (ref >60)
Glucose, Bld: 114 mg/dL — ABNORMAL HIGH (ref 70–99)
Phosphorus: 6.7 mg/dL — ABNORMAL HIGH (ref 2.5–4.6)
Potassium: 3.8 mmol/L (ref 3.5–5.1)
Sodium: 131 mmol/L — ABNORMAL LOW (ref 135–145)

## 2020-01-22 LAB — CBC
HCT: 32.3 % — ABNORMAL LOW (ref 36.0–46.0)
Hemoglobin: 10.5 g/dL — ABNORMAL LOW (ref 12.0–15.0)
MCH: 29.7 pg (ref 26.0–34.0)
MCHC: 32.5 g/dL (ref 30.0–36.0)
MCV: 91.2 fL (ref 80.0–100.0)
Platelets: 189 10*3/uL (ref 150–400)
RBC: 3.54 MIL/uL — ABNORMAL LOW (ref 3.87–5.11)
RDW: 16.6 % — ABNORMAL HIGH (ref 11.5–15.5)
WBC: 3.5 10*3/uL — ABNORMAL LOW (ref 4.0–10.5)
nRBC: 0 % (ref 0.0–0.2)

## 2020-01-22 MED ORDER — SODIUM CHLORIDE 0.9 % IV SOLN: 100.0000 mL | INTRAVENOUS | Status: DC | PRN

## 2020-01-22 MED ORDER — OXYMETAZOLINE HCL 0.05 % NA SOLN: 1.0000 | Freq: Two times a day (BID) | NASAL | 0 refills | Status: DC | PRN

## 2020-01-22 MED ORDER — HEPARIN SODIUM (PORCINE) 1000 UNIT/ML DIALYSIS: 1000.0000 [IU] | INTRAMUSCULAR | Status: DC | PRN

## 2020-01-22 MED ORDER — PENTAFLUOROPROP-TETRAFLUOROETH EX AERO: 1.0000 "application " | INHALATION_SPRAY | CUTANEOUS | Status: DC | PRN

## 2020-01-22 MED ORDER — LIDOCAINE-PRILOCAINE 2.5-2.5 % EX CREA: 1.0000 "application " | TOPICAL_CREAM | CUTANEOUS | Status: DC | PRN

## 2020-01-22 MED ORDER — MENTHOL 3 MG MT LOZG: 1.0000 | LOZENGE | OROMUCOSAL | 12 refills | Status: DC | PRN

## 2020-01-22 MED ORDER — LIDOCAINE HCL (PF) 1 % IJ SOLN: 5.0000 mL | INTRAMUSCULAR | Status: DC | PRN

## 2020-01-22 MED ORDER — ALTEPLASE 2 MG IJ SOLR: 2.0000 mg | Freq: Once | INTRAMUSCULAR | Status: DC | PRN

## 2020-01-22 MED ORDER — SALINE SPRAY 0.65 % NA SOLN: 1.0000 | Freq: Every day | NASAL | 0 refills | Status: DC

## 2020-01-22 MED ORDER — PROSOURCE PLUS PO LIQD: 30.0000 mL | Freq: Every day | ORAL | Status: DC

## 2020-01-22 NOTE — Progress Notes (Signed)
[facility] Progress Note   Subjective:   Patient seen and examined at bedside in dialysis.  No specific complaints.  Apprehensive about d/c tomorrow after being in hospital a long period of time. Denies SOB, reports wearing the O2 because it helps her during dialysis.   Objective Vitals:   01/21/20 0648 01/21/20 1533 01/21/20 1936 01/22/20 0654  BP:  124/61 133/68 120/65  Pulse:  83 85 84  Resp:  15 19 18   Temp:  (!) 97.5 F (36.4 C) 98.3 F (36.8 C) 98 F (36.7 C)  TempSrc:   Oral Oral  SpO2:  90% 95% 92%  Weight: 53.2 kg   53.4 kg  Height:       Physical Exam General:well appearing, thin female in NAD Heart:RRR, no mrg Lungs:CTAB, nml WOB on 2L via [place] Abdomen:soft, NTND Extremities:no LE edema Dialysis Access: LU AVF cannulated  Filed Weights   01/17/20 1400 01/21/20 0648 01/22/20 0654  Weight: 52.3 kg 53.2 kg 53.4 kg    Intake/Output Summary (Last 24 hours) at 01/22/2020 1237 Last data filed at 01/22/2020 0745 Gross per 24 hour  Intake 720 ml  Output --  Net 720 ml    Additional Objective Labs: Basic Metabolic Panel: Recent Labs  Lab 01/16/20 0550 01/17/20 0448 01/19/20 1537  NA 136 137 137  K 3.7 4.0 3.7  CL 98 96* 96*  CO2 25 24 25   GLUCOSE 81 84 111*  BUN 33* 54* 50*  CREATININE 4.49* 7.05* 6.80*  CALCIUM 9.0 9.6 9.6  PHOS  --   --  5.5*   Liver Function Tests: Recent Labs  Lab 01/19/20 1537  ALBUMIN 3.1*   CBC: Recent Labs  Lab 01/16/20 0550 01/17/20 0448 01/19/20 1536  WBC 3.4* 4.2 4.9  HGB 10.7* 10.1* 10.1*  HCT 32.3* 30.1* 31.6*  MCV 91.0 90.7 92.7  PLT 126* 159 183    Medications:  . [START ON 01/23/2020] (feeding supplement) PROSource Plus  30 mL Oral Daily  . ambrisentan  5 mg Oral Daily  . atorvastatin  40 mg Oral Daily  . calcium acetate  1,334 mg Oral TID WC  . camphor-menthol   Topical BID  . COVID-19 mRNA vaccine (Moderna)  0.25 mL Intramuscular Once  . darbepoetin (ARANESP) injection - DIALYSIS  40  mcg Intravenous Q Thu-HD  . dicyclomine  10 mg Oral Q supper  . docusate sodium  100 mg Oral Daily  . feeding supplement (NEPRO CARB STEADY)  237 mL Oral BID BM  . hydrocerin   Topical BID  . loratadine  10 mg Oral Daily  . multivitamin  1 tablet Oral Q0600  . pentoxifylline  400 mg Oral Daily  . psyllium  1 packet Oral BID  . saccharomyces boulardii  250 mg Oral BID  . Selexipag  800 mcg Oral BID  . sodium chloride  1 spray Each Nare 5 X Daily  . tadalafil  20 mg Oral Daily    Dialysis Orders: SW TTS 3.5h 450/600 2/2.25 bath 58.5kg P2 LUA AVF Hep none - Mircera 50 mcg IV q 4 weeks - Hectorol 6 mcg IV TIW - Sensipar 30 mg PO TIW  Assessment/Plan: 1.S/pIntracerebral hemorrhage: Suspected to be related to microvascular disease andwarfarin - now stopped.Because she suffered significant neurological deficits of the right upper and lower extremities, she was admitted to CIR for intensive PT/OT. Paresis improving. Anticipated DC is 01/23/20 2. ESRD:On HD TTS.  HD today per regular schedule.  3. Anemia:Hgb 10.1 - getting  low dose Aranesp q Thurs 4. CKD-MBD:Ca improving, Phos ok.PTH 13 - stopped VDRA and sensipar.On Phoslo as binder -tried other binders and had issues swallowing them and not interested in chewable binder. Will use low Ca bath with HD as well. 5. Nutrition:Alb low, continue Nepro supplements.Diet liberalized to regular with fluid restrictions. 6. Hypertension: BP stable without edema, she has lost quite a bit of body weight during prolonged hospitalization - needs new EDW.Prob is near EDW now, has had significant hypotension, fatigue with attempts to push post weights down.   Jen Mow, PA-C [facility] 01/22/2020,12:37 PM  LOS: 35 days

## 2020-01-22 NOTE — Progress Notes (Signed)
Brief Nutrition Note  RN alerted RD that pt would like to speak with a dietitian prior to discharge. Spoke with pt at bedside. Pt reports that eating her meals, taking Nepro twice daily, and taking ProSource Plus twice daily was "too much." Discussed adjusting timing of supplements with pt who agreed. Will decreased ProSource Plus from BID to daily and adjust timing to before dinner per pt request.  RD will continue to follow pt during admission.   Gustavus Hudon, MS, RD, LDN Inpatient Clinical Dietitian Please see [facility] for contact information.

## 2020-01-22 NOTE — Progress Notes (Signed)
[place] PHYSICAL MEDICINE & REHABILITATION PROGRESS NOTE   Subjective/Complaints: Colace resume per pt request. Likes bentyl. Still feels that there is retained stool but discomfort seems less  ROS: Patient denies fever, rash, sore throat, blurred vision, nausea, vomiting, diarrhea, cough, shortness of breath or chest pain,  headache, or mood change.   Objective:   No results found. Recent Labs    01/19/20 1536  WBC 4.9  HGB 10.1*  HCT 31.6*  PLT 183   Recent Labs    01/19/20 1537  NA 137  K 3.7  CL 96*  CO2 25  GLUCOSE 111*  BUN 50*  CREATININE 6.80*  CALCIUM 9.6    Intake/Output Summary (Last 24 hours) at 01/22/2020 1443 Last data filed at 01/22/2020 0745 Gross per 24 hour  Intake 720 ml  Output --  Net 720 ml     Pressure Injury 12/18/19 Sacrum Medial Stage 1 -  Intact skin with non-blanchable redness of a localized area usually over a bony prominence. Red, non-blanchable spot over sacrum area (Active)  12/18/19 1707  Location: Sacrum  Location Orientation: Medial  Staging: Stage 1 -  Intact skin with non-blanchable redness of a localized area usually over a bony prominence.  Wound Description (Comments): Red, non-blanchable spot over sacrum area  Present on Admission: Yes   Physical Exam: Constitutional: No distress . Vital signs reviewed. HEENT: EOMI, oral membranes moist Neck: supple Cardiovascular: RRR without murmur. No JVD    Respiratory/Chest: CTA Bilaterally without wheezes or rales. Normal effort    GI/Abdomen: BS +, non-tender, non-distended Ext: no clubbing, cyanosis, or edema Psych: pleasant and cooperative Skin: Warm and dry.  No breakdown Musc: No edema in extremities.  No tenderness in extremities. Neuro: Alert Motor: LUE/LE: 5/5 proximal distal RUE: Shoulder abduction, elbow flexion/extension 4/5, wrist extension 3-/5, handgrip 3+/5--stable Right lower extremity: 4/5 hip flexion, knee extension, 2 to 2+/5 ankle  dorsiflexion/PF   Vital Signs Blood pressure 120/65, pulse 84, temperature 98 F (36.7 C), temperature source Oral, resp. rate 18, height 5\' 5"  (1.651 m), weight 53.4 kg, last menstrual period 11/15/2008, SpO2 92 %.  Assessment/Plan: 1. Functional deficits which require 3+ hours per day of interdisciplinary therapy in a comprehensive inpatient rehab setting.  Physiatrist is providing close team supervision and 24 hour management of active medical problems listed below.  Physiatrist and rehab team continue to assess barriers to discharge/monitor patient progress toward functional and medical goals  Care Tool:  Bathing    Body parts bathed by patient: Chest,Abdomen,Right upper leg,Left upper leg,Right arm,Face,Front perineal area,Buttocks,Left arm,Left lower leg,Right lower leg   Body parts bathed by helper: Right lower leg,Left lower leg     Bathing assist Assist Level: Supervision/Verbal cueing     Upper Body Dressing/Undressing Upper body dressing   What is the patient wearing?: Pull over shirt    Upper body assist Assist Level: Supervision/Verbal cueing    Lower Body Dressing/Undressing Lower body dressing      What is the patient wearing?: Pants     Lower body assist Assist for lower body dressing: Contact Guard/Touching assist     Toileting Toileting    Toileting assist Assist for toileting: Contact Guard/Touching assist     Transfers Chair/bed transfer  Transfers assist     Chair/bed transfer assist level: Contact Guard/Touching assist Chair/bed transfer assistive device: Programmer, multimedia   Ambulation assist      Assist level: Minimal Assistance - Patient > 75% Assistive device: Walker-rolling Max distance:  90'   Walk 10 feet activity   Assist     Assist level: Minimal Assistance - Patient > 75% Assistive device: Walker-rolling   Walk 50 feet activity   Assist Walk 50 feet with 2 turns activity did not occur:  Safety/medical concerns  Assist level: Minimal Assistance - Patient > 75% Assistive device: Walker-rolling    Walk 150 feet activity   Assist Walk 150 feet activity did not occur: Safety/medical concerns         Walk 10 feet on uneven surface  activity   Assist Walk 10 feet on uneven surfaces activity did not occur: Safety/medical concerns         Wheelchair     Assist Will patient use wheelchair at discharge?: Yes Type of Wheelchair: Manual Wheelchair activity did not occur: Safety/medical concerns  Wheelchair assist level: Supervision/Verbal cueing Max wheelchair distance: 20'    Wheelchair 50 feet with 2 turns activity    Assist    Wheelchair 50 feet with 2 turns activity did not occur: Safety/medical concerns   Assist Level: Supervision/Verbal cueing   Wheelchair 150 feet activity     Assist  Wheelchair 150 feet activity did not occur: Safety/medical concerns       Blood pressure 120/65, pulse 84, temperature 98 F (36.7 C), temperature source Oral, resp. rate 18, height 5\' 5"  (1.651 m), weight 53.4 kg, last menstrual period 11/15/2008, SpO2 92 %.  Medical Problem List and Plan: 1.  L frontal ICH secondary to recent starting of Coumadin- Coumadin on hold with R hemiparesis            -Continue CIR  Continue WHO at night.   -extended LOS to 1/12 to capture neurological gains, improve safety at home   -making use of extra time, team conference today  -Patient to see MD in the office for transitional care encounter in 1-2 weeks.  2.  Portal vein thrombosis/A fib/Antithrombotics: -DVT/anticoagulation:  Pharmaceutical: SCDs due to thrombocytopenia and recent bleed.  --per neuro no coumadin-->will need to wait at least one month due to size of bleed, patient to follow-up as outpatient with neurology --dopplers ok -resumed statin  per neuro recs, explained rationale for lipitor to patient. Discussed potential side effects.               -antiplatelet therapy: N/A 3. Pain Management: tylenol prn.   - kpad for neck pain  -will replace robaxin with flexeril prn  - tramadol sparingly: q8H prn  4. Mood: LCSW to follow up for evaluation and support.              -antipsychotic agents: N/A  -added trazodone qhs prn for sleep 5. Neuropsych: This patient is capable of making decisions on her own behalf. 6. Wound ulcer/Skin/Wound Care: Pack daily wound care LLE.    -turning, nutrition, education  -eucerin cream bid  -pruritus generally improved, can use prn's 7. Fluids/Electrolytes/Nutrition: Strict I/Os.  -changed renalvit to AM per pt request 8.  ESRD: Continue HD on TTS at the end of the day to help with therapy tolerance.  9. HTN: Monitor BP   Soft, but asymptomatic on 1/10- monitor outpatient  10. Pulmonary HTN: Oxygen dependent. Continue Ambrisentan and Selexipag. On Home dose 2L by [place] 11. Cirrhosis of the liver: Monitor platelets and for any signs of bleeding. Reporting abdominal distension.  (protal vein thrombus)  -f/u abd CT as outpt 12. Chronic systolic CHF: Monitor daily weights as well as I/O. Continue tadalafil.  Filed Weights   01/17/20 1400 01/21/20 0648 01/22/20 0654  Weight: 52.3 kg 53.2 kg 53.4 kg    -volume mgt per nephro  Decreased 1/5: continue to monitor daily 13. CAF: Monitor HR tid--metoprolol has been on hold. Coumadin to be held due to [place].  14. Pancytopenia:  Continue to monitor with serial checks.   Hemoglobin 11.8 on 1/2-->10.4 1/3--> 10.1 1/6  Platelets 116 on 1/2  --. 149 1/3---> 159 1/6  -recheck monday  15. Constipation WITH hemorrhoids- seems to cycle between loose stool  -prn miralax only  -daily fiber packet  -encouraging regular solid/liquid intake as well  --resumed senna-s at bedtime 12/15  -Continue Anusol bid, sitz bath prn for hems  1/9 frequent soft stool with associated pain, rectal cramping.    -levsin without a lot of benefit   -may have IBS   -added probiotic bid and  daily bentyl with supper   -1/10:colace restarted per pt request  16. Significant epistaxis (pt has prior history)  12/18-Dr. Benjamine Mola cauterized hypervascular areas in nasal septi  -still some mild bleeding at times  Follow-up with Dr. Benjamine Mola outpatient.  -continue saline nasal spray, prn afrin, astelin. Emphasized that she needs to keep nasal mucosa moist 17.  Sleep disturbance:   Continue 50mg  trazodone qhs prn  Improving overall 18. Right neck nodule appears resolved. Likely node 20. Sinus congestion: Continue Sudafed 21. Sore throat: Added lozenges and throat spray prn.  22. Ear fullness:  debrox ear drops BID stopped after 1 week   LOS: 35 days A FACE TO [place] 01/22/2020, 9:21 AM

## 2020-01-22 NOTE — Plan of Care (Signed)
  Problem: RH Balance Goal: LTG: Patient will maintain dynamic sitting balance (OT) Description: LTG:  Patient will maintain dynamic sitting balance with assistance during activities of daily living (OT) Outcome: Completed/Met Goal: LTG Patient will maintain dynamic standing with ADLs (OT) Description: LTG:  Patient will maintain dynamic standing balance with assist during activities of daily living (OT)  Outcome: Completed/Met   Problem: Sit to Stand Goal: LTG:  Patient will perform sit to stand in prep for activites of daily living with assistance level (OT) Description: LTG:  Patient will perform sit to stand in prep for activites of daily living with assistance level (OT) Outcome: Completed/Met   Problem: RH Eating Goal: LTG Patient will perform eating w/assist, cues/equip (OT) Description: LTG: Patient will perform eating with assist, with/without cues using equipment (OT) Outcome: Completed/Met   Problem: RH Grooming Goal: LTG Patient will perform grooming w/assist,cues/equip (OT) Description: LTG: Patient will perform grooming with assist, with/without cues using equipment (OT) Outcome: Completed/Met   Problem: RH Bathing Goal: LTG Patient will bathe all body parts with assist levels (OT) Description: LTG: Patient will bathe all body parts with assist levels (OT) Outcome: Completed/Met   Problem: RH Dressing Goal: LTG Patient will perform upper body dressing (OT) Description: LTG Patient will perform upper body dressing with assist, with/without cues (OT). Outcome: Completed/Met Goal: LTG Patient will perform lower body dressing w/assist (OT) Description: LTG: Patient will perform lower body dressing with assist, with/without cues in positioning using equipment (OT) Outcome: Completed/Met   Problem: RH Toileting Goal: LTG Patient will perform toileting task (3/3 steps) with assistance level (OT) Description: LTG: Patient will perform toileting task (3/3 steps) with  assistance level (OT)  Outcome: Completed/Met   Problem: RH Toilet Transfers Goal: LTG Patient will perform toilet transfers w/assist (OT) Description: LTG: Patient will perform toilet transfers with assist, with/without cues using equipment (OT) Outcome: Completed/Met   Problem: RH Tub/Shower Transfers Goal: LTG Patient will perform tub/shower transfers w/assist (OT) Description: LTG: Patient will perform tub/shower transfers with assist, with/without cues using equipment (OT) Outcome: Completed/Met

## 2020-01-22 NOTE — Patient Care Conference (Signed)
Inpatient RehabilitationTeam Conference and Plan of Care Update Date: 01/22/2020   Time: 10:56 AM    Patient Name: Katie Nunez      Medical Record Number: [id number]  Date of Birth: 09/06/1955 Sex: Female         Room/Bed: 4M03C/4M03C-01 Payor Info: Payor: Theme park manager MEDICARE / Plan: UHC MEDICARE / Product Type: *No Product type* /    Admit Date/Time:  12/18/2019  4:05 PM  Primary Diagnosis:  Intraparenchymal hemorrhage of brain [facility])  Hospital Problems: Principal Problem:   Intraparenchymal hemorrhage of brain ([place]) Active Problems:   Pressure injury of skin   Epistaxis   Sleep disturbance   Slow transit constipation   Hemorrhoids   Chronic systolic congestive heart failure ([place])   ESRD on dialysis ([place])   Right hemiparesis ([place])   Protein-calorie malnutrition, severe    Expected Discharge Date: Expected Discharge Date: 01/23/20  Team Members Present: Physician leading conference: Dr. Alger Simons Care Coodinator Present: Loralee Pacas, LCSWA;Daquisha Clermont Creig Hines, RN, BSN, [place] Nurse Present: Suella Grove, RN PT Present: Tereasa Coop, PT OT Present: Cherylynn Ridges, OT PPS Coordinator present : Gunnar Fusi, SLP     Current Status/Progress Goal Weekly Team Focus  Bowel/Bladder   Patient is continent of bladder/bowel, LBM 01/21/20  remain continent of bowel  Assess toileting needs   Swallow/Nutrition/ Hydration             ADL's   functional transfers CGA/CS with RW, ADL min A, ongoing improvements with posture and right side strength and coordination  CGA/CS  adl/transfer training, R NMRE, patient/family educ   Mobility             Communication             Safety/Cognition/ Behavioral Observations            Pain   Hemorrhoid pain continue rateing 6/10  Patient states she does not have a specific goal just to her tolerance  Assess QS/PRN provided perscribe treatment and monitor   Skin   Skin intact ,small stage 1 to coccyx area, foam dressing  applied, Skin remains very dry with itching  remain free of skin breakdown and infection  Asseees QS/PRN     Discharge Planning:      Team Discussion: Patient has intermittent nasal congestion. Continent B/B, has foam to buttocks due to Stage 1, and she has become fixated on her bowels over the last few days. She reports the her skin is itching, MD treating appropriately. Patient on target to meet rehab goals: LUE has almost regained full ROM. Improvements with posture and right side strength and coordination. She is ready for discharge. She has reached goal level with transfers at min assist and household ambulation.  *See Care Plan and progress notes for long and short-term goals.   Revisions to Treatment Plan:    Teaching Needs: Family education complete.  Current Barriers to Discharge: Decreased caregiver support, Home enviroment access/layout, Lack of/limited family support, Medication compliance and Behavior  Possible Resolutions to Barriers: Continue current medications, provide emotional support to patient and family.     Medical Summary Current Status: making nice neurological progress, right arm and leg strength improved dramatically. bowel consistency improving but fixated on it. on HD, no further nose bleeds  Barriers to Discharge: Medical stability   Possible Resolutions to [employer]: daily labs, HD, finalize med mgt for discharge   Continued Need for Acute Rehabilitation Level of Care: The patient requires daily medical management by a  physician with specialized training in physical medicine and rehabilitation for the following reasons: Direction of a multidisciplinary physical rehabilitation program to maximize functional independence : Yes Medical management of patient stability for increased activity during participation in an intensive rehabilitation regime.: Yes Analysis of laboratory values and/or radiology reports with any subsequent need for  medication adjustment and/or medical intervention. : Yes   I attest that I was present, lead the team conference, and concur with the assessment and plan of the team.   Cristi Loron 01/22/2020, 6:05 PM

## 2020-01-22 NOTE — Progress Notes (Addendum)
Patient ID: Katie Nunez, female   DOB: September 10, 1955, 65 y.o.   MRN: [id number]  SW received updates from therapy, DABSC is acceptable for patient.   SW met with pt in room to inform on [place] being [facility] for HHPT/OT/SN/aide. SW also informed DABSC will be ordered.  SW spoke with pt husband to provide above updates, and updates from team conference on supervision/CGA for pt at d/c.   SW ordered [place] with [employer] [facility] via parachute.   Pt will get 16x16 w/c, and then will be provided 16x18 w/c once there is one in stock per [employer] [facility].   Loralee Pacas, MSW, [place] Office: [phone] Cell: [phone] Fax: [phone]

## 2020-01-22 NOTE — Progress Notes (Signed)
Occupational Therapy Session Note  Patient Details  Name: Katie Nunez MRN: [id number] Date of Birth: 08-17-1955  Today's Date: 01/22/2020 OT Individual Time: 1100-1200 OT Individual Time Calculation (min): 60 min    Short Term Goals: Week 5:  OT Short Term Goal 1 (Week 5): LTG=STG 2.2 ELOS  Skilled Therapeutic Interventions/Progress Updates:    Patient seated in w/c, ready for therapy session.   She denies pain and would like to work on her right arm this session.   Completed standardized coordination assessments:   Box and blocks:   L = 44, R = 9 9 hole peg:  L = 31 sec, R = 3 min, 46 sec Reviewed flexor tightness management and stretching techniques, reviewed and practiced OH reach and strategies to improve end range, reviewed and practiced postural control and trunk stability activities, [place], [place].   She demonstrates significant improvement in right arm motor control, balance and functional mobility.  She performed functional transfers and dynamic balance activities this session with CS, occ CGA with complex tasks.  She remained seated in w/c at close of session, call bell and tray table in reach.    Therapy Documentation Precautions:  Precautions Precautions: Fall Precaution Comments: Rt hemiplegia - subluxation Restrictions Weight Bearing Restrictions: No   Therapy/Group: Individual Therapy  Carlos Levering 01/22/2020, 7:40 AM

## 2020-01-22 NOTE — Progress Notes (Signed)
Occupational Therapy Discharge Summary  Patient Details  Name: Katie Nunez MRN: [id number] Date of Birth: 01/16/1955  Patient has met 11 of 11 long term goals due to improved activity tolerance, improved balance, postural control, ability to compensate for deficits, functional use of  RIGHT upper and RIGHT lower extremity, improved attention, improved awareness and improved coordination.  Patient to discharge at overall Supervision/CGA  level.  Patient's care partner is independent to provide the necessary physical assistance at discharge.    Reasons goals not met: n/a  Recommendation:  Patient will benefit from ongoing skilled OT services in outpatient setting to continue to advance functional skills in the area of BADL.  Equipment: drop arm BSC, TTB  Reasons for discharge: treatment goals met and discharge from hospital  Patient/family agrees with progress made and goals achieved: Yes  OT Discharge Precautions/Restrictions  Precautions Precautions: Fall Precaution Comments: R hemiplegia Restrictions Weight Bearing Restrictions: No Pain Pain Assessment Pain Scale: 0-10 Pain Score: 0-No pain ADL ADL Eating: Independent Grooming: Modified independent Upper Body Bathing: Supervision/safety Where Assessed-Upper Body Bathing: Chair Lower Body Bathing: Supervision/safety Where Assessed-Lower Body Bathing: Chair Upper Body Dressing: Supervision/safety Where Assessed-Upper Body Dressing: Chair Lower Body Dressing: Contact guard Where Assessed-Lower Body Dressing: Chair Toileting: Contact guard Where Assessed-Toileting: Toilet Toilet Transfer: Contact guard Toilet Transfer Method: Other (comment) (stedy) Perception  Perception: Within Functional Limits Praxis Praxis: Intact Cognition Overall Cognitive Status: Within Functional Limits for tasks assessed Arousal/Alertness: Awake/alert Orientation Level: Oriented X4 Safety/Judgment: Appears  intact Sensation Sensation Light Touch: Appears Intact Hot/Cold: Appears Intact Coordination Gross Motor Movements are Fluid and Coordinated: No Fine Motor Movements are Fluid and Coordinated: No Coordination and Movement Description: R UE function continues to improve, able to grasp/release, but has tremor and coordination deficits Motor  Motor Motor: Hemiplegia Motor - Skilled Clinical Observations: R hemiplegia Mobility  Transfers Sit to Stand: Contact Guard/Touching assist Stand to Sit: Contact Guard/Touching assist   Balance Static Sitting Balance Static Sitting - Level of Assistance: 6: Modified independent (Device/Increase time) Dynamic Sitting Balance Dynamic Sitting - Level of Assistance: 5: Stand by assistance Static Standing Balance Static Standing - Level of Assistance: 5: Stand by assistance Dynamic Standing Balance Dynamic Standing - Balance Support: During functional activity Dynamic Standing - Level of Assistance: 4: Min assist (Contact Gaurd Assistance) Extremity/Trunk Assessment RUE Assessment RUE Assessment: Exceptions to WFL RUE AROM (degrees) Right Shoulder Flexion: 160 Degrees Right Elbow Flexion: 150 RUE Strength Right Shoulder Flexion: 3/5 Right Elbow Flexion: 3/5 LUE Assessment LUE Assessment: Within Functional Limits    S  01/22/2020, 8:36 AM  

## 2020-01-22 NOTE — Progress Notes (Signed)
Physical Therapy Discharge Summary  Patient Details  Name: Katie Nunez MRN: [id number] Date of Birth: 04/17/1955  Today's Date: 01/22/2020 PT Individual Time: 9509-3267 PT Individual Time Calculation (min): 55 min    Patient has met 11 of 11 long term goals due to improved activity tolerance, improved balance, improved postural control, increased strength and improved coordination.  Patient to discharge at an ambulatory level Supervision.   Patient's care partner is independent to provide the necessary physical assistance at discharge.  Reasons goals not met: NA  Recommendation:  Patient will benefit from ongoing skilled PT services in home health setting to continue to advance safe functional mobility, address ongoing impairments in R lower extremity strength, balance, transfers, ambulation, and minimize fall risk.  Equipment: RW, 16" x 18" manual WC  Reasons for discharge: treatment goals met and discharge from hospital  Patient/family agrees with progress made and goals achieved: Yes   Skilled Therapeutic Interventions: Pt received seated in [facility] and agrees to therapy. Reports soreness in R shoulder. Number not provided. PT provides rest breaks as needed to manage pain symptoms. PT also provides HEP and answers pt's questions regarding performance.  WC transport to gym for time management. Pt performs car transfer and ramp navigation with RW and CGA with verbal cues on sequencing and body mechanics. Pt ambulates 182' with RW, without use of R hand splint, with CGA and cues on upright gaze to improve posture and balance, increased weight shift to the R to improve reciprocal gait pattern, and increasing hip abductor activation to limit "heel whip" due to pt tendency to externally rotate R hip during swing phase with resulting adduction of R heel/foot. Pt performs standing NMR for same issue, stepping forward and backward with R leg, attempting to keep base of support wide enough that  her heel does not contact cones placed between legs as barriers to step around, 1x20. Pt completes x8 6" steps with L hand rail and CGA with cues on step sequencing and foot placement. Pt self propels WC x100' with bilateral lower extremities and supervision with cues on increased R hamstring activation. Pt left seated in WC with all needs within reach.  PT Discharge Precautions/Restrictions Precautions Precautions: Fall Precaution Comments: R hemiplegia Restrictions Weight Bearing Restrictions: No Vision/Perception  Perception Perception: Within Functional Limits Praxis Praxis: Intact  Cognition Overall Cognitive Status: Within Functional Limits for tasks assessed Arousal/Alertness: Awake/alert Orientation Level: Oriented X4 Safety/Judgment: Appears intact Sensation Sensation Light Touch: Appears Intact Coordination Gross Motor Movements are Fluid and Coordinated: No Fine Motor Movements are Fluid and Coordinated: No Heel Shin Test: Improved From eval but decreased accuracy and smoothness Motor  Motor Motor: Hemiplegia Motor - Skilled Clinical Observations: R hemiplegia  Mobility Bed Mobility Bed Mobility: Supine to Sit;Sit to Supine Supine to Sit: Independent Sit to Supine: Independent Transfers Transfers: Sit to Stand;Stand Pivot Transfers;Stand to Sit Sit to Stand: Supervision/Verbal cueing Stand to Sit: Supervision/Verbal cueing Stand Pivot Transfers: Supervision/Verbal cueing Stand Pivot Transfer Details: Verbal cues for technique;Verbal cues for sequencing;Verbal cues for safe use of DME/AE Transfer (Assistive device): Rolling walker Locomotion  Gait Ambulation: Yes Gait Assistance: Contact Guard/Touching assist Gait Distance (Feet): 182 Feet Assistive device: Rolling walker Gait Assistance Details: Verbal cues for technique;Verbal cues for gait pattern;Verbal cues for sequencing;Verbal cues for safe use of DME/AE Gait Gait: Yes Gait Pattern: Impaired Gait  Pattern: Decreased weight shift to right;Poor foot clearance - right Gait velocity: decreased Stairs / Additional Locomotion Stairs: Yes Stairs Assistance: Contact Guard/Touching assist  Stair Management Technique: One rail Left Number of Stairs: 8 Height of Stairs: 6 Ramp: Contact Guard/touching assist Curb: Contact Guard/Touching assist Wheelchair Mobility Wheelchair Mobility: Yes Wheelchair Assistance: Supervision/Verbal cueing Wheelchair Propulsion: Both lower extermities Distance: 100'  Trunk/Postural Assessment  Cervical Assessment Cervical Assessment: Exceptions to WFL (forward head) Thoracic Assessment Thoracic Assessment: Exceptions to WFL (rounded shoulders) Lumbar Assessment Lumbar Assessment: Exceptions to WFL (posterior pelvic tilt) Postural Control Postural Control: Deficits on evaluation Righting Reactions: delayed Protective Responses: delayed  Balance Balance Balance Assessed: Yes Static Sitting Balance Static Sitting - Level of Assistance: 6: Modified independent (Device/Increase time) Dynamic Sitting Balance Dynamic Sitting - Level of Assistance: 5: Stand by assistance Static Standing Balance Static Standing - Level of Assistance: 5: Stand by assistance Dynamic Standing Balance Dynamic Standing - Balance Support: During functional activity Dynamic Standing - Level of Assistance: 4: Min assist (CGA) Extremity Assessment  RLE Assessment RLE Assessment: Exceptions to WFL RLE Strength RLE Overall Strength: Deficits Right Hip Flexion: 3+/5 Right Hip ABduction: 3/5 Right Hip ADduction: 4/5 Right Knee Flexion: 3/5 Right Knee Extension: 4/5 Right Ankle Dorsiflexion: 4/5 Right Ankle Plantar Flexion: 4/5 LLE Assessment LLE Assessment: Within Functional Limits     C , PT, DPT 01/22/2020, 12:27 PM   

## 2020-01-22 NOTE — Progress Notes (Signed)
Occupational Therapy Session Note  Patient Details  Name: Katie Nunez MRN: [id number] Date of Birth: [birth year]  Today's Date: 01/22/2020 OT Individual Time: 6701-4103 OT Individual Time Calculation (min): 60 min    Short Term Goals: Week 4:  OT Short Term Goal 1 (Week 4): LTG=STG 2/2 ELOS  Skilled Therapeutic Interventions/Progress Updates:    Pt greeted seated EOB donning non-slip socks with supervision. Pt reported need to go to the bathroom. She ambulated to the bathroom w/ RW and CGA and verbal cues for R foot clearance when stepping. Pt with successful BM and able to complete peri-care with supervision. CGA for clothing management. Pt ambulated back out of bathroom in similar fashion. Dressing completed at EOB with supervision for UB dressing, and CGA for LB dressing when standing to pull up pants.Improved use of R UE within dressing tasks as pt able to assist with pulling up pants with R hand now. Grooming tasks from standing at the sink. Worked on R fine motor control to assist with opening and closing containers. Pt left seated in wc at end of session with R UE on 1/2 lap tray and needs met.   Therapy Documentation Precautions:  Precautions Precautions: Fall Precaution Comments: Rt hemiplegia - subluxation Restrictions Weight Bearing Restrictions: No Pain: Pain Assessment Pain Scale: 0-10 Pain Score: 0-No pain   Therapy/Group: Individual Therapy  Valma Cava 01/22/2020, 7:50 AM

## 2020-01-23 MED ORDER — CLONAZEPAM 0.5 MG PO TABS: 0.5000 mg | ORAL_TABLET | Freq: Two times a day (BID) | ORAL | 0 refills | Status: AC | PRN

## 2020-01-23 MED ORDER — SACCHAROMYCES BOULARDII 250 MG PO CAPS: 250.0000 mg | ORAL_CAPSULE | Freq: Two times a day (BID) | ORAL | 0 refills | Status: DC

## 2020-01-23 MED ORDER — TADALAFIL (PAH) 20 MG PO TABS: 20.0000 mg | ORAL_TABLET | Freq: Every day | ORAL | 0 refills | Status: DC

## 2020-01-23 MED ORDER — HYDROCERIN EX CREA: 1.0000 "application " | TOPICAL_CREAM | Freq: Two times a day (BID) | CUTANEOUS | 0 refills | Status: DC

## 2020-01-23 MED ORDER — CAMPHOR-MENTHOL 0.5-0.5 % EX LOTN: TOPICAL_LOTION | Freq: Two times a day (BID) | CUTANEOUS | 0 refills | Status: AC

## 2020-01-23 MED ORDER — UPTRAVI 800 MCG PO TABS: 800.0000 ug | ORAL_TABLET | Freq: Two times a day (BID) | ORAL | 0 refills | Status: AC

## 2020-01-23 MED ORDER — GABAPENTIN 100 MG PO CAPS: 100.0000 mg | ORAL_CAPSULE | Freq: Two times a day (BID) | ORAL | 0 refills | Status: DC

## 2020-01-23 MED ORDER — PENTOXIFYLLINE ER 400 MG PO TBCR
400.0000 mg | EXTENDED_RELEASE_TABLET | Freq: Every day | ORAL | 0 refills | Status: DC
Start: 2020-01-23 — End: 2021-06-10

## 2020-01-23 MED ORDER — FAMOTIDINE 20 MG PO TABS: 20.0000 mg | ORAL_TABLET | Freq: Every day | ORAL | 0 refills | Status: AC | PRN

## 2020-01-23 MED ORDER — DOCUSATE SODIUM 100 MG PO CAPS: 200.0000 mg | ORAL_CAPSULE | Freq: Every day | ORAL | 0 refills | Status: AC

## 2020-01-23 MED ORDER — ATORVASTATIN CALCIUM 40 MG PO TABS: 40.0000 mg | ORAL_TABLET | Freq: Every day | ORAL | 0 refills | Status: DC

## 2020-01-23 MED ORDER — AZELASTINE HCL 0.1 % NA SOLN: 1.0000 | Freq: Two times a day (BID) | NASAL | 12 refills | Status: AC | PRN

## 2020-01-23 MED ORDER — HYDROCORTISONE (PERIANAL) 2.5 % EX CREA: TOPICAL_CREAM | Freq: Two times a day (BID) | CUTANEOUS | 0 refills | Status: DC | PRN

## 2020-01-23 MED ORDER — DEXTROMETHORPHAN POLISTIREX ER 30 MG/5ML PO SUER: 30.0000 mg | Freq: Two times a day (BID) | ORAL | 0 refills | Status: DC | PRN

## 2020-01-23 MED ORDER — RENA-VITE PO TABS: 1.0000 | ORAL_TABLET | Freq: Every day | ORAL | 0 refills | Status: AC

## 2020-01-23 MED ORDER — PSYLLIUM 95 % PO PACK: 1.0000 | PACK | Freq: Two times a day (BID) | ORAL | Status: DC

## 2020-01-23 MED ORDER — GABAPENTIN 100 MG PO CAPS: 100.0000 mg | ORAL_CAPSULE | Freq: Two times a day (BID) | ORAL | 0 refills | Status: DC | PRN

## 2020-01-23 MED ORDER — LORATADINE 10 MG PO TABS: 10.0000 mg | ORAL_TABLET | Freq: Every day | ORAL | 0 refills | Status: DC

## 2020-01-23 MED ORDER — CALCIUM ACETATE (PHOS BINDER) 667 MG PO CAPS: 1334.0000 mg | ORAL_CAPSULE | Freq: Three times a day (TID) | ORAL | 0 refills | Status: DC

## 2020-01-23 NOTE — Progress Notes (Signed)
[facility] Progress Note   Subjective:   Patient seen and examined at bedside.  Reports she is feeling well.  To go home today. Denies CP, SOB, abdominal pain, and n/v/d.  Objective Vitals:   01/22/20 1655 01/22/20 1749 01/22/20 2037 01/23/20 0417  BP: 106/73 127/71 112/60 121/61  Pulse: 90 92 91 93  Resp:  19 18 19   Temp: 97.8 F (36.6 C) 98.6 F (37 C) 98.2 F (36.8 C) 98.6 F (37 C)  TempSrc: Oral Oral Oral Oral  SpO2: 96% 92% 97% 98%  Weight: 54.1 kg   52.5 kg  Height:       Physical Exam General:well appearing, thin female in NAD Heart:RRR, no mrg Lungs:CTAB, nml WOB on 2L via [place] Abdomen:soft, NTND Extremities:no LE edema Dialysis Access: LU AVF cannulated    Filed Weights   01/22/20 1345 01/22/20 1655 01/23/20 0417  Weight: 55.7 kg 54.1 kg 52.5 kg    Intake/Output Summary (Last 24 hours) at 01/23/2020 1151 Last data filed at 01/23/2020 0640 Gross per 24 hour  Intake 340 ml  Output 1005 ml  Net -665 ml    Additional Objective Labs: Basic Metabolic Panel: Recent Labs  Lab 01/17/20 0448 01/19/20 1537 01/22/20 1420  NA 137 137 131*  K 4.0 3.7 3.8  CL 96* 96* 92*  CO2 24 25 23   GLUCOSE 84 111* 114*  BUN 54* 50* 60*  CREATININE 7.05* 6.80* 7.96*  CALCIUM 9.6 9.6 9.4  PHOS  --  5.5* 6.7*   Liver Function Tests: Recent Labs  Lab 01/19/20 1537 01/22/20 1420  ALBUMIN 3.1* 3.0*   CBC: Recent Labs  Lab 01/17/20 0448 01/19/20 1536 01/22/20 1420  WBC 4.2 4.9 3.5*  HGB 10.1* 10.1* 10.5*  HCT 30.1* 31.6* 32.3*  MCV 90.7 92.7 91.2  PLT 159 183 189    Medications:  . (feeding supplement) PROSource Plus  30 mL Oral Daily  . ambrisentan  5 mg Oral Daily  . atorvastatin  40 mg Oral Daily  . calcium acetate  1,334 mg Oral TID WC  . camphor-menthol   Topical BID  . COVID-19 mRNA vaccine (Moderna)  0.25 mL Intramuscular Once  . darbepoetin (ARANESP) injection - DIALYSIS  40 mcg Intravenous Q Thu-HD  . docusate sodium  100 mg Oral  Daily  . feeding supplement (NEPRO CARB STEADY)  237 mL Oral BID BM  . hydrocerin   Topical BID  . loratadine  10 mg Oral Daily  . multivitamin  1 tablet Oral Q0600  . pentoxifylline  400 mg Oral Daily  . psyllium  1 packet Oral BID  . saccharomyces boulardii  250 mg Oral BID  . Selexipag  800 mcg Oral BID  . sodium chloride  1 spray Each Nare 5 X Daily  . tadalafil  20 mg Oral Daily    Dialysis Orders: SW TTS 3.5h 450/600 2/2.25 bath 58.5kg P2 LUA AVF Hep none - Mircera 50 mcg IV q 4 weeks - Hectorol 6 mcg IV TIW - Sensipar 30 mg PO TIW  Assessment/Plan: 1.S/pIntracerebral hemorrhage: Suspected to be related to microvascular disease andwarfarin - now stopped.Because she suffered significant neurological deficits of the right upper and lower extremities, she was admitted to CIR for intensive PT/OT. Paresis improving. Anticipated DC is today. 2. ESRD:On HD TTS. HD tomorrow per regular schedule.  3. Anemia:Hgb10.5- getting low dose Aranesp q Thurs 4. CKD-MBD:Ca improving, Phos elevated today.PTH 13 - stopped VDRA and sensipar.On Phoslo as binder -tried other binders and  had issues swallowing them and not interested in chewable binder. Will use low Ca bath with HD as well. 5. Nutrition:Alb 3.0, continue Nepro supplements.Diet liberalized to regular with fluid restrictions. 6. Hypertension: BP stable without edema, she has lost quite a bit of body weight during prolonged hospitalization - needs new EDW.Prob is near EDWnow, has had significant hypotension, fatigue with attempts to push post weights down.   Jen Mow, PA-C [facility] 01/23/2020,11:51 AM  LOS: 36 days

## 2020-01-23 NOTE — Progress Notes (Signed)
Inpatient Rehabilitation Care Coordinator Discharge Note  The overall goal for the admission was met for:   Discharge location: Yes. D/c to home with husband.   Length of Stay: Yes. 35 days.   Discharge activity level: Yes. Supervision.   Home/community participation: Yes. Limited.   Services provided included: MD, RD, PT, OT, SLP, RN, CM, TR, Pharmacy, Neuropsych and SW  Financial Services: Private Insurance: UHC Medicare  Choices offered to/list presented to:list Medicare.gov provided  Follow-up services arranged: Home Health: [facility] for HHPT/OT/SN/aide, DME: [employer] [facility] for w/c (correct w/c size will be provided when in stock), DABSC, shower chair w/ back (to be delivered to home) and Patient/Family request agency HH: [facility], DME: N/A  Comments (or additional information): contact pt husband [place] #[phone]  Patient/Family verbalized understanding of follow-up arrangements: Yes  Individual responsible for coordination of the follow-up plan: Pt to have assistance with coordinating care needs.   Confirmed correct DME delivered:  A  01/23/2020     A  

## 2020-01-23 NOTE — Discharge Instructions (Signed)
Inpatient Rehab Discharge Instructions  Katie Nunez Discharge date and time:  01/23/20   Activities/Precautions/ Functional Status: Activity: no lifting, driving, or strenuous exercise till cleared by MD Diet: renal diet Limit fluids to 5 cups/day (1200 cc/day) Wound Care: Routine skin checks   Functional status:  ___ No restrictions     ___ Walk up steps independently _X__ 24/7 supervision/assistance   ___ Walk up steps with assistance ___ Intermittent supervision/assistance  ___ Bathe/dress independently ___ Walk with walker     _X__ Bathe/dress with assistance ___ Walk Independently    ___ Shower independently ___ Walk with assistance    ___ Shower with assistance _X__ No alcohol     ___ Return to work/school ________    COMMUNITY REFERRALS UPON DISCHARGE:    Home Health:   PT     OT      RN    SNA                 Agency: [facility] Phone: [phone] *Please expect follow-up within 2-3 days of discharge to schedule your home visit. If you have not received follow-up, be sure to contact the branch directly.*   Medical Equipment/Items Ordered: 3in1 bedside commode, shower chair, wheelchair, rolling walker                                                 Agency/Supplier: [facility] [phone]   Special Instructions: No driving smoking or alcohol  Continue hemodialysis as directed  Hold Coumadin until follow up with Neurology.     STROKE/TIA DISCHARGE INSTRUCTIONS SMOKING Cigarette smoking nearly doubles your risk of having a stroke & is the single most alterable risk factor  If you smoke or have smoked in the last 12 months, you are advised to quit smoking for your health.  Most of the excess cardiovascular risk related to smoking disappears within a year of stopping.  Ask you doctor about anti-smoking medications  [place] Quit Line: 1-800-QUIT NOW  Free Smoking Cessation Classes [phone]  CHOLESTEROL Know your levels; limit fat & cholesterol in  your diet  Lipid Panel     Component Value Date/Time   CHOL 194 12/12/2019 2043   TRIG 48 12/12/2019 2043   HDL 105 12/12/2019 2043   CHOLHDL 1.8 12/12/2019 2043   VLDL 10 12/12/2019 2043   [place] 79 12/12/2019 2043      Many patients benefit from treatment even if their cholesterol is at goal.  Goal: Total Cholesterol (CHOL) less than 160  Goal:  Triglycerides (TRIG) less than 150  Goal:  HDL greater than 40  Goal:  LDL (LDLCALC) less than 100   BLOOD PRESSURE American Stroke Association blood pressure target is less that 120/80 mm/Hg  Your discharge blood pressure is:  BP: 121/61  Monitor your blood pressure  Limit your salt and alcohol intake  Many individuals will require more than one medication for high blood pressure  DIABETES (A1c is a blood sugar average for last 3 months) Goal HGBA1c is under 7% (HBGA1c is blood sugar average for last 3 months)  Diabetes:     Lab Results  Component Value Date   HGBA1C 5.2 12/12/2019     Your HGBA1c can be lowered with medications, healthy diet, and exercise.  Check your blood sugar as directed by your physician  Call your physician  if you experience unexplained or low blood sugars.  PHYSICAL ACTIVITY/REHABILITATION Goal is 30 minutes at least 4 days per week  Activity: No driving, Therapies: see above Return to work: N/a  Activity decreases your risk of heart attack and stroke and makes your heart stronger.  It helps control your weight and blood pressure; helps you relax and can improve your mood.  Participate in a regular exercise program.  Talk with your doctor about the best form of exercise for you (dancing, walking, swimming, cycling).  DIET/WEIGHT Goal is to maintain a healthy weight  Your discharge diet is:  Diet Order            Diet regular Room service appropriate? Yes; Fluid consistency: Thin; Fluid restriction: 1200 mL Fluid  Diet effective now                liquids Your height is:  Height: 5\' 5"   (165.1 cm) Your current weight is: Weight: 52.5 kg Your Body Mass Index (BMI) is:  BMI (Calculated): 19.19  Following the type of diet specifically designed for you will help prevent another stroke.  You are at goal weight   Your goal Body Mass Index (BMI) is 19-24.  Healthy food habits can help reduce 3 risk factors for stroke:  High cholesterol, hypertension, and excess weight.  RESOURCES Stroke/Support Group:  Call [phone]   STROKE EDUCATION PROVIDED/REVIEWED AND GIVEN TO PATIENT Stroke warning signs and symptoms How to activate emergency medical system (call 911). Medications prescribed at discharge. Need for follow-up after discharge. Personal risk factors for stroke. Pneumonia vaccine given:  Flu vaccine given:  My questions have been answered, the writing is legible, and I understand these instructions.  I will adhere to these goals & educational materials that have been provided to me after my discharge from the hospital.     My questions have been answered and I understand these instructions. I will adhere to these goals and the provided educational materials after my discharge from the hospital.  Patient/Caregiver Signature _______________________________ Date __________  Clinician Signature _______________________________________ Date __________  Please bring this form and your medication list with you to all your follow-up doctor's appointments.

## 2020-01-23 NOTE — Progress Notes (Signed)
[facility] PHYSICAL MEDICINE & REHABILITATION PROGRESS NOTE   Subjective/Complaints: Had 3 stools after HD all soft to mushy. No associated pain. Doesn't think bentyl or levsin has made a big difference  ROS: Patient denies fever, rash, sore throat, blurred vision, nausea, vomiting, diarrhea, cough, shortness of breath or chest pain, joint or back pain, headache, or mood change.   Objective:   No results found. Recent Labs    01/22/20 1420  WBC 3.5*  HGB 10.5*  HCT 32.3*  PLT 189   Recent Labs    01/22/20 1420  NA 131*  K 3.8  CL 92*  CO2 23  GLUCOSE 114*  BUN 60*  CREATININE 7.96*  CALCIUM 9.4    Intake/Output Summary (Last 24 hours) at 01/23/2020 0750 Last data filed at 01/23/2020 0640 Gross per 24 hour  Intake 340 ml  Output 1005 ml  Net -665 ml     Pressure Injury 12/18/19 Sacrum Medial Stage 1 -  Intact skin with non-blanchable redness of a localized area usually over a bony prominence. Red, non-blanchable spot over sacrum area (Active)  12/18/19 1707  Location: Sacrum  Location Orientation: Medial  Staging: Stage 1 -  Intact skin with non-blanchable redness of a localized area usually over a bony prominence.  Wound Description (Comments): Red, non-blanchable spot over sacrum area  Present on Admission: Yes   Physical Exam: Constitutional: No distress . Vital signs reviewed. HEENT: EOMI, oral membranes moist Neck: supple Cardiovascular: RRR without murmur. No JVD    Respiratory/Chest: CTA Bilaterally without wheezes or rales. Normal effort    GI/Abdomen: BS +, non-tender, non-distended Ext: no clubbing, cyanosis, or edema Psych: pleasant and cooperative Skin: Warm and dry.  No breakdown Musc: No edema in extremities.  No tenderness in extremities. Neuro: Alert Motor: LUE/LE: 5/5 proximal distal RUE: Shoulder abduction, elbow flexion/extension 4/5, wrist extension 3-/5, handgrip 3+to 4/5  Right lower extremity: 4/5 hip flexion, knee extension,   2+/5  ankle dorsiflexion/PF   Vital Signs Blood pressure 121/61, pulse 93, temperature 98.6 F (37 C), temperature source Oral, resp. rate 19, height 5\' 5"  (1.651 m), weight 52.5 kg, last menstrual period 11/15/2008, SpO2 98 %.  Assessment/Plan: 1. Functional deficits which require 3+ hours per day of interdisciplinary therapy in a comprehensive inpatient rehab setting.  Physiatrist is providing close team supervision and 24 hour management of active medical problems listed below.  Physiatrist and rehab team continue to assess barriers to discharge/monitor patient progress toward functional and medical goals  Care Tool:  Bathing    Body parts bathed by patient: Chest,Abdomen,Right upper leg,Left upper leg,Right arm,Face,Front perineal area,Buttocks,Left arm,Left lower leg,Right lower leg   Body parts bathed by helper: Right lower leg,Left lower leg     Bathing assist Assist Level: Supervision/Verbal cueing     Upper Body Dressing/Undressing Upper body dressing   What is the patient wearing?: Pull over shirt    Upper body assist Assist Level: Supervision/Verbal cueing    Lower Body Dressing/Undressing Lower body dressing      What is the patient wearing?: Pants     Lower body assist Assist for lower body dressing: Contact Guard/Touching assist     Toileting Toileting    Toileting assist Assist for toileting: Contact Guard/Touching assist     Transfers Chair/bed transfer  Transfers assist     Chair/bed transfer assist level: Contact Guard/Touching assist Chair/bed transfer assistive device: Programmer, multimedia   Ambulation assist      Assist level: Contact  Guard/Touching assist Assistive device: Walker-rolling Max distance: 182'   Walk 10 feet activity   Assist     Assist level: Contact Guard/Touching assist Assistive device: Walker-rolling   Walk 50 feet activity   Assist Walk 50 feet with 2 turns activity did not occur:  Safety/medical concerns  Assist level: Contact Guard/Touching assist Assistive device: Walker-rolling    Walk 150 feet activity   Assist Walk 150 feet activity did not occur: Safety/medical concerns  Assist level: Contact Guard/Touching assist Assistive device: Walker-rolling    Walk 10 feet on uneven surface  activity   Assist Walk 10 feet on uneven surfaces activity did not occur: Safety/medical concerns   Assist level: Contact Guard/Touching assist Assistive device: Aeronautical engineer Will patient use wheelchair at discharge?: Yes Type of Wheelchair: Manual Wheelchair activity did not occur: Safety/medical concerns  Wheelchair assist level: Supervision/Verbal cueing Max wheelchair distance: 100'    Wheelchair 50 feet with 2 turns activity    Assist    Wheelchair 50 feet with 2 turns activity did not occur: Safety/medical concerns   Assist Level: Supervision/Verbal cueing   Wheelchair 150 feet activity     Assist  Wheelchair 150 feet activity did not occur: Safety/medical concerns   Assist Level: Minimal Assistance - Patient > 75%   Blood pressure 121/61, pulse 93, temperature 98.6 F (37 C), temperature source Oral, resp. rate 19, height 5\' 5"  (1.651 m), weight 52.5 kg, last menstrual period 11/15/2008, SpO2 98 %.  Medical Problem List and Plan: 1.  L frontal ICH secondary to recent starting of Coumadin- Coumadin on hold with R hemiparesis             -dc home today!  -Patient to see MD in the office for transitional care encounter in 1-2 weeks.  2.  Portal vein thrombosis/A fib/Antithrombotics: -DVT/anticoagulation:  Pharmaceutical: SCDs due to thrombocytopenia and recent bleed.  --resume coumadin as outpt per neurology recs --dopplers ok -resumed statin  per neuro recs, explained rationale for lipitor to patient. Discussed potential side effects.              -antiplatelet therapy: N/A 3. Pain Management: tylenol prn.    - kpad for neck pain  -will replace robaxin with flexeril prn  - tramadol sparingly: q8H prn  4. Mood: LCSW to follow up for evaluation and support.              -antipsychotic agents: N/A  -added trazodone qhs prn for sleep 5. Neuropsych: This patient is capable of making decisions on her own behalf. 6. Wound ulcer/Skin/Wound Care: Pack daily wound care LLE.    -turning, nutrition, education  -eucerin cream bid  -pruritus resolved 7. Fluids/Electrolytes/Nutrition: Strict I/Os.  -changed renalvit to AM per pt request 8.  ESRD: Continue HD on TTS at the end of the day to help with therapy tolerance.  9. HTN: bp controlled 10. Pulmonary HTN: Oxygen dependent. Continue Ambrisentan and Selexipag. On Home dose 2L by [place] 11. Cirrhosis of the liver: Monitor platelets and for any signs of bleeding. Reporting abdominal distension.  (protal vein thrombus)  -f/u abd CT as outpt 12. Chronic systolic CHF: Monitor daily weights as well as I/O. Continue tadalafil.    Filed Weights   01/22/20 1345 01/22/20 1655 01/23/20 0417  Weight: 55.7 kg 54.1 kg 52.5 kg    -volume mgt per nephro  Decreased 1/5: continue to monitor daily 13. CAF: Monitor HR tid--metoprolol has been on hold. Coumadin  to be held due to [place].  14. Pancytopenia:  Continue to monitor with serial checks.   Numbers stable 1/11    15. Constipation WITH hemorrhoids- seems to cycle between loose stool  -multiple interventions without a lot of improvement, perhaps pain better  -recommended that she use colace once per day and that she look at dietary intake, timing, fluids etc to find the right "mix" for her 16. Significant epistaxis (pt has prior history)  12/18-Dr. Benjamine Mola cauterized hypervascular areas in nasal septi  -still some mild bleeding at times  Follow-up with Dr. Benjamine Mola outpatient.  -continue saline nasal spray, prn afrin, astelin.   -keep nose moist 17.  Sleep disturbance:   Continue 50mg  trazodone qhs prn  Improving  overall 18. Right neck nodule appears resolved. Likely node 20. Sinus congestion: Continue Sudafed 21. Sore throat: Added lozenges and throat spray prn.  22. Ear fullness:  debrox ear drops BID stopped after 1 week   LOS: 36 days A FACE TO [place] 01/23/2020, 7:50 AM

## 2020-01-23 NOTE — Progress Notes (Signed)
Patient ID: Katie Nunez, female   DOB: [birth year], 65 y.o.   MRN: [id number]  SW met with pt and pt husband in room to discuss concerns related to w/c in which they have elevating leg rests versus swing away. SW to follow-up with [facility] about issue. No other questions/concerns reported.   SW updated [facility] on issue. States they will provide elevating leg rests when they bring correct w/c. SW requested [facility] follow-up with the husband. SW called/left message for pt husband Katie Nunez [phone]) to provide updates.  Loralee Pacas, MSW, [place] Office: [phone] Cell: [phone] Fax: [phone]

## 2020-01-23 NOTE — Discharge Summary (Addendum)
Physician Discharge Summary  Patient ID: Katie Nunez MRN: [id number] DOB/AGE: [birth year] 65 y.o.  Admit date: 12/18/2019 Discharge date: 01/23/2020  Discharge Diagnoses:  Principal Problem:   Intraparenchymal hemorrhage of brain [facility]) Active Problems:   Pressure injury of skin   Epistaxis   Sleep disturbance   Slow transit constipation   Hemorrhoids   Chronic systolic congestive heart failure (HCC)   ESRD on dialysis (HCC)   Right hemiparesis (HCC)   Protein-calorie malnutrition, severe   Discharged Condition: stable   Significant Diagnostic Studies: No results found.  Labs:  Basic Metabolic Panel: Recent Labs  Lab 01/17/20 0448 01/19/20 1537 01/22/20 1420  NA 137 137 131*  K 4.0 3.7 3.8  CL 96* 96* 92*  CO2 24 25 23   GLUCOSE 84 111* 114*  BUN 54* 50* 60*  CREATININE 7.05* 6.80* 7.96*  CALCIUM 9.6 9.6 9.4  PHOS  --  5.5* 6.7*    CBC: Recent Labs  Lab 01/17/20 0448 01/19/20 1536 01/22/20 1420  WBC 4.2 4.9 3.5*  HGB 10.1* 10.1* 10.5*  HCT 30.1* 31.6* 32.3*  MCV 90.7 92.7 91.2  PLT 159 183 189    CBG: No results for input(s): GLUCAP in the last 168 hours.  Brief HPI:   Katie Nunez is a 65 y.o. RH-female with history of HTN, ESRD-TTS, Raynaud's, achalasia, cirrhosis of liver, PAH-oxygen dependent, A. fib with multiple recent hospitalizations for C. difficile colitis, portal vein thrombosis as well as LLE abscess requiring drainage.  She was admitted on 12/12/2019 with onset of RUE /RLE weakness and was found to have left frontal ICH with cerebral edema.  Coumadin was reversed with Kcentra and vitamin K and follow-up CT head showed no change in ICH or edema.  Stroke was felt to be secondary to warfarin coagulopathy and Dr. Erlinda Hong recommended resuming [facility] on outpatient basis per neurology clinic once ICH resolved.  Patient with resultant left-sided weakness with poor sitting balance as well as right shoulder and myofascial pain.  Therapy was ongoing and CIR  was recommended due to functional decline.   Hospital Course: Katie Nunez was admitted to rehab 12/18/2019 for inpatient therapies to consist of PT, ST and OT at least three hours five days a week. Past admission physiatrist, therapy team and rehab RN have worked together to provide customized collaborative inpatient rehab.  She tolerated ESRD on TTS schedule.  Blood pressures were monitored on 3 times daily basis and will intermittently noted to be on the low side.  Phoslo was increased to help manage elevated phosphorus level.she has history of chronic constipation alternating with diarrhea and her bowel program has been adjusted multiple times throughout her stay.  She has been educated on use of probiotic, fiber as well as importance of adjusting laxatives to prevent constipation.    Her hospital course was significant for development of significant epistaxis on 12/09.  This was treated with nasal packing for a week as well as Keflex for prophylaxis.  Dr. Benjamine Mola was consulted and has been following for input.  She did develop recurrent epistaxis on 12/16 requiring packing on the right.  She was taking to OR on 12/18 for bilateral endoscopic cauterization to control of nasal hemorrhage.  She has had mild intermittent bleeding postprocedure and has been educated on use of saline nasal spray 5 times a day as well as humidifier was recommended to prevent further irritation.  Bleeding had resolved by discharge. Respiratory status has been stable and she is utilizing oxygen with  activity and at nights.   Neck and shoulder pain has been managed with local measures as well as as needed use of tramadol.  Left lower extremity wound was noted to be closed and healed.  No drainage or breakdown noted during her stay.  She expressed concerns about her recent weight loss and diet was liberalized by nephrology with recommendations to continue low-salt and low potassium diet.  Serial check of CBC has shown intermittent  thrombocytopenia.  Acute blood loss anemia has been monitored and is stable.  Right neck nodule likely due to inflamed lymph node has resolved.  She also reported issues with sinus pain and congestion and ear fullness treated with addition of claritin as well as short course of Debrox ear drops.  She has been making steady gains during her rehab stay.  She has had improvement in right motor return and has progressed to supervision at wheelchair level. She will continue to receive follow up [place] [place] [place] [facility] [place] [facility] [place] after discharge.    Rehab course: During patient's stay in rehab weekly team conferences were held to monitor patient's progress, set goals and discuss barriers to discharge. At admission, patient required max assist with mobility and with basic self care tasks. She exhibited mild deficits in attention and ST recall with min cues to attend to tasks. She  has had improvement in activity tolerance, balance, postural control as well as ability to compensate for deficits. She has had improvement in functional use RUE  and RLE as well as improvement in awareness. She is able to complete ADL tasks with supervision. She requires supervision for transfers and CGA to ambulate 182" with RW. Speech therapy signed off on 12/22 as patient ws able to complete functional and mildly complex tasks at modified independent level. Family education has been completed with husband.   Disposition: Home  Diet: Renal diet- limit fluids to 1200 cc/day.   Special Instructions: 1. No driving or strenuous activity.  2. Keep humidifier on at all times.  3. Follow up with neurology for input on anticoagulation.    Discharge Instructions    Ambulatory referral to Neurology   Complete by: As directed    An appointment is requested in approximately: 2-3 weeks   Ambulatory referral to Physical Medicine Rehab   Complete by: As directed    1-2 weeks TC appt     Allergies as of 01/23/2020       Reactions   Other Anaphylaxis   Do not use polyflux membrane.  Use alternate   Savella [milnacipran Hcl] Palpitations, Other (See Comments)   Unknown   Tape Rash, Other (See Comments)   Itch- unsure if it was paper or adhesive tape      Medication List    STOP taking these medications   Sensipar 30 MG tablet Generic drug: cinacalcet   traMADol 50 MG tablet Commonly known as: ULTRAM   warfarin 5 MG tablet Commonly known as: COUMADIN     TAKE these medications   ambrisentan 5 MG tablet Commonly known as: LETAIRIS Take 5 mg by mouth daily.   atorvastatin 40 MG tablet Commonly known as: LIPITOR Take 1 tablet (40 mg total) by mouth daily. Start taking on: January 24, 2020   azelastine 0.1 % nasal spray Commonly known as: ASTELIN Place 1 spray into both nostrils 2 (two) times daily as needed for rhinitis. Use in each nostril as directed   calcium acetate 667 MG capsule Commonly known as: PHOSLO Take  2 capsules (1,334 mg total) by mouth 3 (three) times daily with meals. What changed:   how much to take  when to take this  additional instructions   camphor-menthol lotion Commonly known as: SARNA Apply topically 2 (two) times daily.   clonazePAM 0.5 MG tablet Commonly known as: KLONOPIN Take 1 tablet (0.5 mg total) by mouth 2 (two) times daily as needed for anxiety.   dextromethorphan 30 MG/5ML liquid Commonly known as: DELSYM Take 5 mLs (30 mg total) by mouth 2 (two) times daily as needed for cough.   docusate sodium 100 MG capsule Commonly known as: COLACE Take 2 capsules (200 mg total) by mouth daily. What changed: when to take this   famotidine 20 MG tablet Commonly known as: PEPCID Take 1 tablet (20 mg total) by mouth daily as needed for heartburn or indigestion. What changed: when to take this   gabapentin 100 MG capsule Commonly known as: NEURONTIN Take 1 capsule (100 mg total) by mouth 2 (two) times daily. What changed:   when to take  this  reasons to take this   hydrocerin Crea Apply 1 application topically 2 (two) times daily. To dry skin--avoid IV site   hydrocortisone 2.5 % rectal cream Commonly known as: ANUSOL-HC Place rectally 2 (two) times daily as needed for hemorrhoids or anal itching.   hydrOXYzine 25 MG tablet Commonly known as: ATARAX/VISTARIL Take 25 mg by mouth every 8 (eight) hours as needed for itching.   loratadine 10 MG tablet Commonly known as: CLARITIN Take 1 tablet (10 mg total) by mouth daily. Start taking on: January 24, 2020   menthol-cetylpyridinium 3 MG lozenge Commonly known as: CEPACOL Take 1 lozenge (3 mg total) by mouth as needed for sore throat.   multivitamin Tabs tablet Take 1 tablet by mouth daily at 6 (six) AM. Start taking on: January 24, 2020   oxymetazoline 0.05 % nasal spray Commonly known as: AFRIN Place 1 spray into both nostrils 2 (two) times daily as needed for congestion.   pentoxifylline 400 MG CR tablet Commonly known as: TRENTAL Take 1 tablet (400 mg total) by mouth daily.   psyllium 95 % Pack Commonly known as: HYDROCIL/METAMUCIL Take 1 packet by mouth 2 (two) times daily.   saccharomyces boulardii 250 MG capsule Commonly known as: FLORASTOR Take 1 capsule (250 mg total) by mouth 2 (two) times daily.   sodium chloride 0.65 % Soln nasal spray Commonly known as: OCEAN Place 1 spray into both nostrils 5 (five) times daily. At least 5 times a day   Spacer/Aero-Holding [employer] Use spacer with albuterol   tadalafil (PAH) 20 MG tablet Commonly known as: ADCIRCA Take 1 tablet (20 mg total) by mouth daily.   Uptravi 800 MCG Tabs Generic drug: Selexipag Take 1 tablet (800 mcg total) by mouth in the morning and at bedtime.       Follow-up Information    Meredith Staggers, MD Follow up.   Specialty: Physical Medicine and Rehabilitation Why: office will call with follow up appointment Contact information: [address] [place] [address] [phone]        [name], MD. Call.   Specialty: Family Medicine Why: for post hospital follow up Contact information: [place] [address] [phone]        [place]. Call.   Why: if you dont't hear from them in 3-4 days Contact information: [place]  [address] [place] [address] [phone]       Leta Baptist, MD. Call.   Specialty: Otolaryngology Why: for post hospital follow up on nose bleeds Contact information: [address] [place] [address] [phone]        Newt Minion, MD .   Specialty: Orthopedic Surgery Contact information: [address] [place] [address] [phone]               Signed: Bary Leriche 01/23/2020, 10:52 AM

## 2020-01-25 ENCOUNTER — Telehealth: Payer: Self-pay | Admitting: *Deleted

## 2020-01-25 ENCOUNTER — Telehealth: Payer: Self-pay | Admitting: Physician Assistant

## 2020-01-25 NOTE — Telephone Encounter (Signed)
Transition Care Management Unsuccessful Follow-up Telephone Call  Date of discharge and from where:  [facility] 01/23/2020 Attempts:  1st Attempt  Reason for unsuccessful TCM follow-up call:  Left voice message including date of appt and address to our office.

## 2020-01-25 NOTE — Telephone Encounter (Signed)
Transition of care contact from inpatient facility  Date of Discharge: 01/23/20 Date of Contact: 01/25/20 Method of contact: Phone  Attempted to contact patient to discuss transition of care from inpatient admission. Patient did not answer the phone. Message was left on the patient's voicemail with call back instructions.  Anice Paganini, PA-C 01/25/2020, 12:14 PM  [employer]

## 2020-02-05 ENCOUNTER — Encounter: Payer: Medicare Other | Attending: Registered Nurse | Admitting: Registered Nurse

## 2020-02-05 ENCOUNTER — Other Ambulatory Visit: Payer: Self-pay

## 2020-02-05 ENCOUNTER — Encounter: Payer: Self-pay | Admitting: Registered Nurse

## 2020-02-05 ENCOUNTER — Encounter: Payer: Medicare Other | Admitting: Registered Nurse

## 2020-02-05 VITALS — BP 135/80 | HR 91 | Temp 99.1°F | Ht 65.0 in | Wt 117.1 lb

## 2020-02-05 DIAGNOSIS — I619 Nontraumatic intracerebral hemorrhage, unspecified: Secondary | ICD-10-CM

## 2020-02-05 DIAGNOSIS — Z992 Dependence on renal dialysis: Secondary | ICD-10-CM | POA: Diagnosis present

## 2020-02-05 DIAGNOSIS — G8191 Hemiplegia, unspecified affecting right dominant side: Secondary | ICD-10-CM

## 2020-02-05 DIAGNOSIS — K5901 Slow transit constipation: Secondary | ICD-10-CM | POA: Diagnosis present

## 2020-02-05 DIAGNOSIS — N186 End stage renal disease: Secondary | ICD-10-CM

## 2020-02-05 NOTE — Progress Notes (Signed)
Subjective:    Patient ID: Katie Nunez, female    DOB: [birth year], 66 y.o.   MRN: [id number]  HPI: Katie Nunez is a 65 y.o. female who is here for Hospital Follow up appointment of her Intraparenchymal Hemorrhage of Brain, Right Hemiparesis, Slow Transit Constipation and ESRD on Hemodialysis. She presented to [facility] ED on  12/12/2019 with complaints of right upper and right lower extremities. Neurology consulted.   CT Head WO Contrast:  IMPRESSION: Acute left frontal parenchymal hemorrhage with mild edema. No significant mass effect.  IMPRESSION: 1. Stable left frontal intraparenchymal hemorrhage and mild surrounding edema.  MR Brain WO Contrast:  IMPRESSION: Brain MRI:  Known acute hemorrhage in the subcortical left frontal white matter with rim of edema. A few remote hemorrhages have also occurred in the bilateral upper cerebrum suggesting underlying vasculopathy or bleeding diathesis. Notable lack of chronic white matter disease.  Intracranial MRA:  Negative.  Katie Nunez was admitted to inpatient rehabilitation on 12/18/2019 and discharged home on 01/23/2020. She's receiving outpatient therapy with [facility].   She states she has rectal pain ( due to constipation), encouraged to continue bowel regimen. Also reports she has a good appetite.    Pain Inventory Average Pain 6 Pain Right Now 6 My pain is stabbing  LOCATION OF PAIN  Neck, shoulder fingers, buttocks  BOWEL Number of stools per week: 7+ Oral laxative use Yes  Type of laxative miralax Enema or suppository use No  History of colostomy No  Incontinent No   BLADDER Dialysis In and out cath, frequency n/a  Able to self cath n/a Bladder incontinence No  Frequent urination No  Leakage with coughing  Difficulty starting stream No  Incomplete bladder emptying No    Mobility walk with assistance use a walker do you drive?  no use a  wheelchair  Function retired  Neuro/Psych bowel control problems weakness tremor tingling trouble walking  Prior Studies transitions of care  Physicians involved in your care transitions of care   Family History  Problem Relation Age of Onset  . Hypertension Mother   . Diabetes Mother   . Heart disease Father   . Hypertension Maternal Aunt   . Diabetes Maternal Grandmother   . Heart disease Paternal Grandfather   . Cerebral palsy Cousin        1ST COUSIN?  . Diabetes Paternal Grandmother    Social History   Socioeconomic History  . Marital status: Married    Spouse name: Not on file  . Number of children: Not on file  . Years of education: Not on file  . Highest education level: Not on file  Occupational History  . Not on file  Tobacco Use  . Smoking status: Never Smoker  . Smokeless tobacco: Never Used  Vaping Use  . Vaping Use: Never used  Substance and Sexual Activity  . Alcohol use: No  . Drug use: No  . Sexual activity: Not Currently    Birth control/protection: Post-menopausal  Other Topics Concern  . Not on file  Social History Narrative   Lives in [place]une[place] Determinants of Health   Financial Resource Strain: Not on file  Food Insecurity: Not on file  Transportation Needs: Not on file  Physical Activity: Not on file  Stress: Not on file  Social Connections: Not on file   Past Surgical History:  Procedure Laterality Date  . ANKLE FRACTURE SURGERY Right   . AV FISTULA PLACEMENT Left 06/28/2016  Procedure: left arm ARTERIOVENOUS (AV) FISTULA CREATION;  Surgeon: Rosetta Posner, MD;  Location: [facility];  Service: Vascular;  Laterality: Left;  . BASCILIC VEIN TRANSPOSITION Left 09/27/2016   Procedure: LEFT UPPER ARM CEPHALIC VEIN TRANSPOSITION;  Surgeon: Rosetta Posner, MD;  Location: [place];  Service: Vascular;  Laterality: Left;  . BREAST BIOPSY     "? side"  . CESAREAN SECTION  1994  . CO2 LASER OF CERVIX    . COLONOSCOPY W/ BIOPSIES   01/08/2008  . INSERTION OF DIALYSIS CATHETER Right 06/28/2016   Procedure: INSERTION OF DIALYSIS CATHETER, right internal jugular;  Surgeon: Rosetta Posner, MD;  Location: [place];  Service: Vascular;  Laterality: Right;  . MYOMECTOMY    . NASAL ENDOSCOPY WITH EPISTAXIS CONTROL N/A 12/29/2019   Procedure: NASAL ENDOSCOPY WITH EPISTAXIS CONTROL;  Surgeon: Leta Baptist, MD;  Location: [facility] OR;  Service: ENT;  Laterality: N/A;  . PELVIC LAPAROSCOPY  2011  . superficial thrombophlebitis Left 07-2014   Past Medical History:  Diagnosis Date  . Achalasia   . Anxiety   . Dysplasia of cervix, low grade (CIN 1)   . Environmental allergies    "all year long" (12/27/2016)  . ESRD (end stage renal disease) on dialysis [facility])    "TTS; [place]" (12/27/2016)  . Fibromyalgia   . GERD (gastroesophageal reflux disease)   . Gout   . Hypertension   . IBS (irritable bowel syndrome)   . MVP (mitral valve prolapse)   . RA (rheumatoid arthritis) (HCC)    FOLLOWED BY DR. SHANAHAN  . Raynaud's disease   . Scleroderma ([place])   . Seasonal allergies   . Thrombocytopenia ([place]) 07/01/2016   Acute fall to 13,000 07/01/16  . Tubular adenoma 01/08/2008   CECUM  . Vitamin D deficiency    BP 135/80   Pulse 91   Temp 99.1 F (37.3 C)   Ht 5\' 5"  (1.651 m)   Wt 117 lb 1 oz (53.1 kg)   LMP 11/15/2008   SpO2 (!) 88%   BMI 19.48 kg/m   Opioid Risk Score:   Fall Risk Score:  `1  Depression screen PHQ 2/9  Depression screen [facility] 2/9 01/31/2018 09/26/2017 05/30/2017 03/14/2017 01/07/2017  Decreased Interest 0 0 0 0 0  Down, Depressed, Hopeless 0 0 0 0 0  PHQ - 2 Score 0 0 0 0 0  Some recent data might be hidden    Review of Systems  Constitutional: Positive for unexpected weight change.  Eyes: Negative.   Respiratory: Positive for cough.   Cardiovascular: Positive for leg swelling.  Gastrointestinal: Positive for constipation and rectal pain.  Endocrine: Negative.   Genitourinary: Positive for difficulty  urinating.       Dialysis   Musculoskeletal: Positive for arthralgias, gait problem and neck pain.  Skin: Negative.   Allergic/Immunologic: Negative.   Neurological: Positive for tremors and weakness.       Tingling   Psychiatric/Behavioral: Negative.   All other systems reviewed and are negative.      Objective:   Physical Exam Vitals and nursing note reviewed.  Constitutional:      Appearance: Normal appearance.  Cardiovascular:     Rate and Rhythm: Normal rate and regular rhythm.     Pulses: Normal pulses.     Heart sounds: Normal heart sounds.  Pulmonary:     Effort: Pulmonary effort is normal.     Breath sounds: Normal breath sounds.  Musculoskeletal:     Cervical  back: Normal range of motion and neck supple.     Comments: Normal Muscle Bulk and Muscle Testing Reveals:  Upper Extremities: Full ROM and Muscle Strength on Right 4/5 and Left 5/5 Right AC Joint Tenderness Thoracic Paraspinal Tenderness: T-1-T-3 Lower Extremities: Full ROM and Muscle Strength 5/5  Right Lower Extremity Flexion Produces Pain into her Right Lower Extremity Arrived in wheelchair    Skin:    General: Skin is warm and dry.  Neurological:     Mental Status: She is alert and oriented to person, place, and time.  Psychiatric:        Mood and Affect: Mood normal.        Behavior: Behavior normal.           Assessment & Plan:  1. Intraparenchymal Hemorrhage of Brain/Right Hemiparesis: Continue Outpatient Therapy with [place]. She has a follow up appointment with Neurology.  2.Slow Transit Constipation: Continue current medication regimen. Continue to monitor. PCP Following.  3.ESRD on Hemodialysis. On Hemodialysis on Tues/Thursday and Saturday.Nephrology Following.   F/U with Dr Posey Pronto in 4-6 weeks

## 2020-02-13 ENCOUNTER — Ambulatory Visit: Payer: Medicare Other | Attending: Family Medicine

## 2020-02-13 ENCOUNTER — Other Ambulatory Visit: Payer: Self-pay

## 2020-02-13 ENCOUNTER — Ambulatory Visit: Payer: Medicare Other | Admitting: Occupational Therapy

## 2020-02-13 ENCOUNTER — Encounter: Payer: Self-pay | Admitting: Occupational Therapy

## 2020-02-13 DIAGNOSIS — R2681 Unsteadiness on feet: Secondary | ICD-10-CM

## 2020-02-13 DIAGNOSIS — I619 Nontraumatic intracerebral hemorrhage, unspecified: Secondary | ICD-10-CM | POA: Diagnosis present

## 2020-02-13 DIAGNOSIS — R2689 Other abnormalities of gait and mobility: Secondary | ICD-10-CM | POA: Diagnosis present

## 2020-02-13 DIAGNOSIS — M25671 Stiffness of right ankle, not elsewhere classified: Secondary | ICD-10-CM | POA: Insufficient documentation

## 2020-02-13 DIAGNOSIS — M25672 Stiffness of left ankle, not elsewhere classified: Secondary | ICD-10-CM | POA: Diagnosis present

## 2020-02-13 DIAGNOSIS — R278 Other lack of coordination: Secondary | ICD-10-CM

## 2020-02-13 DIAGNOSIS — M25572 Pain in left ankle and joints of left foot: Secondary | ICD-10-CM | POA: Diagnosis present

## 2020-02-13 DIAGNOSIS — M6281 Muscle weakness (generalized): Secondary | ICD-10-CM | POA: Insufficient documentation

## 2020-02-13 DIAGNOSIS — R29818 Other symptoms and signs involving the nervous system: Secondary | ICD-10-CM | POA: Diagnosis present

## 2020-02-13 DIAGNOSIS — M25571 Pain in right ankle and joints of right foot: Secondary | ICD-10-CM | POA: Diagnosis present

## 2020-02-13 NOTE — Therapy (Signed)
[facility] [address] [place], [place], [address] Phone: [phone]   Fax:  [phone]  Physical Therapy Evaluation  Patient Details  Name: Katie Nunez MRN: [id number] Date of Birth: [birth year] Referring Provider (PT): [name] MD   Encounter Date: 02/13/2020   PT End of Session - 02/13/20 1419    Visit Number 1    Number of Visits 12    Date for PT Re-Evaluation 04/13/20    PT Start Time 6734    PT Stop Time 1445    PT Time Calculation (min) 40 min    Equipment Utilized During Treatment Gait belt    Activity Tolerance Patient tolerated treatment well    Behavior During Therapy [facility] for tasks assessed/performed           Past Medical History:  Diagnosis Date  . Achalasia   . Anxiety   . Dysplasia of cervix, low grade (CIN 1)   . Environmental allergies    "all year long" (12/27/2016)  . ESRD (end stage renal disease) on dialysis [facility])    "TTS; [place]" (12/27/2016)  . Fibromyalgia   . GERD (gastroesophageal reflux disease)   . Gout   . Hypertension   . IBS (irritable bowel syndrome)   . MVP (mitral valve prolapse)   . RA (rheumatoid arthritis) (HCC)    FOLLOWED BY DR. SHANAHAN  . Raynaud's disease   . Scleroderma ([place])   . Seasonal allergies   . Thrombocytopenia ([place]) 07/01/2016   Acute fall to 13,000 07/01/16  . Tubular adenoma 01/08/2008   CECUM  . Vitamin D deficiency     Past Surgical History:  Procedure Laterality Date  . ANKLE FRACTURE SURGERY Right   . AV FISTULA PLACEMENT Left 06/28/2016   Procedure: left arm ARTERIOVENOUS (AV) FISTULA CREATION;  Surgeon: Rosetta Posner, MD;  Location: [place];  Service: Vascular;  Laterality: Left;  . BASCILIC VEIN TRANSPOSITION Left 09/27/2016   Procedure: LEFT UPPER ARM CEPHALIC VEIN TRANSPOSITION;  Surgeon: Rosetta Posner, MD;  Location: [place];  Service: Vascular;  Laterality: Left;  . BREAST BIOPSY     "? side"  . CESAREAN SECTION  1994   . CO2 LASER OF CERVIX    . COLONOSCOPY W/ BIOPSIES  01/08/2008  . INSERTION OF DIALYSIS CATHETER Right 06/28/2016   Procedure: INSERTION OF DIALYSIS CATHETER, right internal jugular;  Surgeon: Rosetta Posner, MD;  Location: [place];  Service: Vascular;  Laterality: Right;  . MYOMECTOMY    . NASAL ENDOSCOPY WITH EPISTAXIS CONTROL N/A 12/29/2019   Procedure: NASAL ENDOSCOPY WITH EPISTAXIS CONTROL;  Surgeon: Leta Baptist, MD;  Location: [facility] OR;  Service: ENT;  Laterality: N/A;  . PELVIC LAPAROSCOPY  2011  . superficial thrombophlebitis Left 07-2014    There were no vitals filed for this visit.    Subjective Assessment - 02/13/20 1414    Subjective no pain to report, ICH in 12/21, had CIR, HHPT and is referred to OPPT, goals for patient are to strengthen R knee/ankle, get off of cane and return to driving    Pertinent History Scleroderma, RA, pulmonary HTN, kidney disease (dialysis) Tu,TH and Sat, Rt ankle fx/dislocation/surgery 2000    Patient Stated Goals strengthen ankle and knee R, get off of cane    Currently in Pain? Yes    Pain Score 2     Pain Location Ankle    Pain Orientation Right    Pain Descriptors / Indicators Aching  Pain Type Chronic pain    Pain Onset More than a month ago    Pain Frequency Occasional    Aggravating Factors  standing and walking    Pain Orientation Right    Pain Descriptors / Indicators Aching    Pain Frequency Occasional              [facility] PT Assessment - 02/13/20 1552      Assessment   Medical Diagnosis ICH    Referring Provider (PT) [name] MD    Onset Date/Surgical Date 12/12/19    Hand Dominance Right    Next MD Visit PRN    Prior Therapy CIR, HH      Precautions   Precautions Fall    Precaution Comments no driving, no strenous activity per hospital d/c      Prior Function   Level of Independence Independent    Vocation Retired      Strength   Right Knee Flexion 4/5    Right Knee Extension 4/5    Right Ankle Dorsiflexion 4/5     Right Ankle Plantar Flexion 4/5      Transfers   Sit to Stand 4: Min guard;Without upper extremity assist    Five time sit to stand comments  24 seconds      Ambulation/Gait   Ambulation/Gait Yes    Ambulation/Gait Assistance 4: Min guard    Ambulation Distance (Feet) 75 Feet    Assistive device Straight cane    Gait Pattern Decreased step length - right;Step-to pattern;Step-through pattern;Decreased dorsiflexion - right    Stairs Yes    Stairs Assistance 4: Min guard    Stair Management Technique Two rails;Alternating pattern    Number of Stairs 8    Height of Stairs 6      Timed Up and Go Test   TUG Normal TUG    Normal TUG (seconds) 18    TUG Comments performed 2 bouts, with and w/o cane, test time is average of 2 bouts      High Level Balance   High Level Balance Comments Able to maintain all 4 positions on CTSIB for 3 seconds                      Objective measurements completed on examination: See above findings.               PT Education - 02/13/20 1502    Education Details see Medbridge handout,Patient educated on assessment findings, POC and rehab potential, patient goals reviewed and patient in agreement with treatment plan    Person(s) Educated Patient    Methods Explanation    Comprehension Verbalized understanding            PT Short Term Goals - 02/13/20 1517      PT SHORT TERM GOAL #1   Title Ind with initial HEP and able to demo back to PT w/o need of VCs    Baseline no HEP    Time 2    Period Weeks    Status New    Target Date 02/27/20      PT SHORT TERM GOAL #2   Title Patient to obtain [facility] and bring it to PT sessions for adjustment and training    Baseline not using cane prior    Time 2    Period Weeks    Status New    Target Date 02/27/20  PT Long Term Goals - 02/13/20 1520      PT LONG TERM GOAL #1   Title Patient to improve 5x STS score to < 20 sec    Baseline 24 seconds    Time 8     Period Weeks    Status New    Target Date 04/09/20      PT LONG TERM GOAL #2   Title Patient to demo 4+/5 R ankle/knee strength throughout    Baseline 4/5 R ankle knee strength    Time 8    Period Weeks    Status New    Target Date 04/09/20      PT LONG TERM GOAL #3   Title Patient to decerease TUG time to ,13.5 sec w/o AD    Baseline 18 sec TUG average of 2 trials    Time 8    Period Weeks    Status New    Target Date 04/09/20      PT LONG TERM GOAL #4   Title Ambulate 1053f with LRAD across outdoor surfaces under distant S    Baseline limited to in home ambulation    Time 8    Period Weeks    Status New    Target Date 04/09/20      PT LONG TERM GOAL #5   Title Return to driving as appropriate    Baseline currently not driving    Time 8    Period Weeks    Status New    Target Date 04/09/20                  Plan - 02/13/20 1505    Clinical Impression Statement [place] to continue RLE strength, balance and gait training, she is ambulating with a SPC, demos mild impulsive behavior, R ankle/knee weakness, negotiates stairs with reciprocal pattern and single rail    Personal Factors and Comorbidities Age;Comorbidity 2    Comorbidities ESRD(HD T, Th, Sat), R ankle fx 2000    Examination-Participation Restrictions Community Activity    Stability/Clinical Decision Making Stable/Uncomplicated    Clinical Decision Making Low    Rehab Potential Good    PT Frequency 2x / week    PT Duration 4 weeks   2w4, 1w4   PT Treatment/Interventions ADLs/Self Care Home Management;Cryotherapy;Electrical Stimulation;Ultrasound;Therapeutic exercise;Balance training;Patient/family education;Manual techniques;Dry needling;Therapeutic activities;Neuromuscular re-education;DME Instruction;Gait training;Stair training;Functional mobility training    PT Next Visit Plan progress to RLE strength exercises focused on ankle and knee, review ambulation with  cane and initiate balance training    PT Home Exercise Plan heel/toe raises, standing marching    Consulted and Agree with Plan of Care Patient           Patient will benefit from skilled therapeutic intervention in order to improve the following deficits and impairments:  Postural dysfunction,Decreased balance,Impaired flexibility,Abnormal gait,Decreased mobility,Difficulty walking,Decreased coordination  Visit Diagnosis: Intraparenchymal hemorrhage of brain (HCC)  Other abnormalities of gait and mobility     Problem List Patient Active Problem List   Diagnosis Date Noted  . Protein-calorie malnutrition, severe 01/19/2020  . Epistaxis   . Sleep disturbance   . Slow transit constipation   . Hemorrhoids   . Chronic systolic congestive heart failure ([place]   . ESRD on dialysis ([place]   . Right hemiparesis ([place]   . Pressure injury of skin 12/20/2019  . Intraparenchymal hemorrhage of brain ([place] 12/18/2019  . ICH (intracerebral hemorrhage) ([place] 12/12/2019  . Viral disease  08/22/2019  . Chronic right-sided heart failure ([place]) 07/09/2019  . Supraventricular tachycardia ([place]) 07/09/2019  . Other cirrhosis of liver ([place]) 06/27/2019  . Heart failure ([place]) 02/21/2019  . Heart palpitations 08/14/2018  . Other fluid overload 07/27/2018  . Encounter for long-term (current) use of other medications 01/18/2018  . Hypothyroidism 01/18/2018  . Myalgia and myositis 01/18/2018  . Chronic nephritis 01/18/2018  . Other interstitial pulmonary diseases with fibrosis in diseases classified elsewhere ([place]) 01/18/2018  . Other long term (current) drug therapy 01/18/2018  . Sleep apnea 01/18/2018  . Unspecified persistent mental disorders due to conditions classified elsewhere 01/18/2018  . Venous reflux 01/18/2018  . Vitamin B12 deficiency 01/18/2018  . Vitamin D deficiency 01/18/2018  . Obstructive lung disease ([place]) 12/30/2017  . Other pruritus 12/27/2017  . Eruption cyst 12/19/2017  .  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 ([place]) 11/28/2017  . Chronic cough 11/11/2017  . Pulmonary hypertension ([place]) 11/11/2017  . Pulmonary arterial hypertension ([place]) 10/07/2017  . Acute ITP ([place]) 01/05/2017  . ESRD (end stage renal disease) ([place]) 12/28/2016  . Encounter for removal of sutures 09/09/2016  . Coagulation defect, unspecified ([place]) 09/01/2016  . Underimmunization status 08/05/2016  . Hemolytic anemia ([place]) 07/26/2016  . Epistaxis, recurrent 07/26/2016  . Unspecified protein-calorie malnutrition ([place]) 07/13/2016  . Aftercare including intermittent dialysis ([place]) 07/07/2016  . Anemia in chronic kidney disease 07/07/2016  . Hypokalemia 07/07/2016  . Iron deficiency anemia, unspecified 07/07/2016  . 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 Nonrheumatic mitral (valve) prolapse 07/07/2016  . Other irritable bowel syndrome 07/07/2016  . Other secondary thrombocytopenia 07/07/2016  . Secondary hyperparathyroidism of renal origin ([place]) 07/07/2016  . Thrombocytopenia ([place]) 07/01/2016  . ARF (acute renal failure) ([place]) 06/25/2016  . Anxiety 06/25/2016  . Bilateral carpal tunnel syndrome 10/22/2015  . Chronic gout without tophus 10/22/2015  . Chronic nonintractable headache 10/08/2015  . Fibroid uterus 01/03/2012  . H/O vitamin D deficiency 01/03/2012  . Post-menopausal 01/03/2012  . Hereditary and idiopathic peripheral neuropathy 11/19/2011  . Intestinal malabsorption 11/19/2011  . Lichen planus 04/59/9774  . Low back pain 11/19/2011  . Diffuse spasm of esophagus 11/11/2011  . ESR raised 11/11/2011  . Postinflammatory pulmonary fibrosis ([place]) 11/11/2011  . Scleroderma ([employer]) 11/16/2010  . Rheumatoid arthritis ([place]) 11/16/2010  . Raynaud's disease 11/16/2010  . Symptomatic menopausal or female climacteric states 11/16/2010    Lanice Shirts PT 02/13/2020, 4:06 PM  [facility] [address]  [place], [place], [address] Phone: [phone]   Fax:  [phone]  Name: Katie Nunez MRN: [id number] Date of Birth: 03/22/1955

## 2020-02-13 NOTE — Progress Notes (Signed)
Patient educated on assessment findings, POC and rehab potential, patient goals reviewed and patient in agreement with treatment plan

## 2020-02-13 NOTE — Therapy (Signed)
[facility] [address] [place], [place], [address] Phone: [phone]   Fax:  [phone]  Physical Therapy Evaluation  Patient Details  Name: Katie Nunez MRN: [id number] Date of Birth: 1955/08/03 No data recorded  Encounter Date: 02/13/2020   PT End of Session - 02/13/20 1419    Visit Number 1    Number of Visits 12    Date for PT Re-Evaluation 04/13/20    PT Start Time 5009    PT Stop Time 1445    PT Time Calculation (min) 40 min    Equipment Utilized During Treatment Gait belt    Activity Tolerance Patient tolerated treatment well    Behavior During Therapy [facility] for tasks assessed/performed           Past Medical History:  Diagnosis Date  . Achalasia   . Anxiety   . Dysplasia of cervix, low grade (CIN 1)   . Environmental allergies    "all year long" (12/27/2016)  . ESRD (end stage renal disease) on dialysis [facility])    "TTS; [place]" (12/27/2016)  . Fibromyalgia   . GERD (gastroesophageal reflux disease)   . Gout   . Hypertension   . IBS (irritable bowel syndrome)   . MVP (mitral valve prolapse)   . RA (rheumatoid arthritis) (HCC)    FOLLOWED BY DR. SHANAHAN  . Raynaud's disease   . Scleroderma ([place])   . Seasonal allergies   . Thrombocytopenia ([place]) 07/01/2016   Acute fall to 13,000 07/01/16  . Tubular adenoma 01/08/2008   CECUM  . Vitamin D deficiency     Past Surgical History:  Procedure Laterality Date  . ANKLE FRACTURE SURGERY Right   . AV FISTULA PLACEMENT Left 06/28/2016   Procedure: left arm ARTERIOVENOUS (AV) FISTULA CREATION;  Surgeon: Rosetta Posner, MD;  Location: [place];  Service: Vascular;  Laterality: Left;  . BASCILIC VEIN TRANSPOSITION Left 09/27/2016   Procedure: LEFT UPPER ARM CEPHALIC VEIN TRANSPOSITION;  Surgeon: Rosetta Posner, MD;  Location: [place];  Service: Vascular;  Laterality: Left;  . BREAST BIOPSY     "? side"  . CESAREAN SECTION  1994  . CO2 LASER OF CERVIX     . COLONOSCOPY W/ BIOPSIES  01/08/2008  . INSERTION OF DIALYSIS CATHETER Right 06/28/2016   Procedure: INSERTION OF DIALYSIS CATHETER, right internal jugular;  Surgeon: Rosetta Posner, MD;  Location: [place];  Service: Vascular;  Laterality: Right;  . MYOMECTOMY    . NASAL ENDOSCOPY WITH EPISTAXIS CONTROL N/A 12/29/2019   Procedure: NASAL ENDOSCOPY WITH EPISTAXIS CONTROL;  Surgeon: Leta Baptist, MD;  Location: [facility] OR;  Service: ENT;  Laterality: N/A;  . PELVIC LAPAROSCOPY  2011  . superficial thrombophlebitis Left 07-2014    There were no vitals filed for this visit.    Subjective Assessment - 02/13/20 1414    Subjective no pain to report, ICH in 12/21, had CIR, HHPT and is referred to OPPT, goals for patient are to strengthen R knee/ankle, get off of cane and return to driving    Pertinent History Scleroderma, RA, pulmonary HTN, kidney disease (dialysis) Tu,TH and Sat, Rt ankle fx/dislocation/surgery 2000    Patient Stated Goals strengthen ankle and knee R, get off of cane    Currently in Pain? Yes    Pain Score 2     Pain Location Ankle    Pain Orientation Right    Pain Descriptors / Indicators Aching    Pain Type Chronic  pain    Pain Onset More than a month ago    Pain Frequency Occasional    Aggravating Factors  standing and walking    Pain Orientation Right    Pain Descriptors / Indicators Aching    Pain Frequency Occasional                          Objective measurements completed on examination: See above findings.               PT Education - 02/13/20 1502    Education Details see Medbridge handout,Patient educated on assessment findings, POC and rehab potential, patient goals reviewed and patient in agreement with treatment plan    Person(s) Educated Patient    Methods Explanation    Comprehension Verbalized understanding            PT Short Term Goals - 02/13/20 1517      PT SHORT TERM GOAL #1   Title Ind with initial HEP and able to demo  back to PT w/o need of VCs    Baseline no HEP    Time 2    Period Weeks    Status New    Target Date 02/27/20      PT SHORT TERM GOAL #2   Title Patient to obtain [facility] and bring it to PT sessions for adjustment and training    Baseline not using cane prior    Time 2    Period Weeks    Status New    Target Date 02/27/20             PT Long Term Goals - 02/13/20 1520      PT LONG TERM GOAL #1   Title Patient to improve 5x STS score to < 20 sec    Baseline 24 seconds    Time 8    Period Weeks    Status New    Target Date 04/09/20      PT LONG TERM GOAL #2   Title Patient to demo 4+/5 R ankle/knee strength throughout    Baseline 4/5 R ankle knee strength    Time 8    Period Weeks    Status New    Target Date 04/09/20      PT LONG TERM GOAL #3   Title Patient to decerease TUG time to ,13.5 sec w/o AD    Baseline 18 sec TUG average of 2 trials    Time 8    Period Weeks    Status New    Target Date 04/09/20      PT LONG TERM GOAL #4   Title Ambulate 1079f with LRAD across outdoor surfaces under distant S    Baseline limited to in home ambulation    Time 8    Period Weeks    Status New    Target Date 04/09/20      PT LONG TERM GOAL #5   Title Return to driving as appropriate    Baseline currently not driving    Time 8    Period Weeks    Status New    Target Date 04/09/20                  Plan - 02/13/20 1505    Clinical Impression Statement [facility] [facility] to continue RLE strength, balance and gait training, she is ambulating with a SPC, demos mild impulsive behavior, R ankle/knee weakness,  negotiates stairs with reciprocal pattern and single rail    Personal Factors and Comorbidities Age;Comorbidity 2    Comorbidities ESRD(HD T, Th, Sat), R ankle fx 2000    Examination-Participation Restrictions Community Activity    Stability/Clinical Decision Making Stable/Uncomplicated    Clinical Decision Making Low    Rehab  Potential Good    PT Frequency 2x / week    PT Duration 4 weeks   2w4, 1w4   PT Treatment/Interventions ADLs/Self Care Home Management;Cryotherapy;Electrical Stimulation;Ultrasound;Therapeutic exercise;Balance training;Patient/family education;Manual techniques;Dry needling;Therapeutic activities;Neuromuscular re-education;DME Instruction;Gait training;Stair training;Functional mobility training    PT Next Visit Plan progress to RLE strength exercises focused on ankle and knee, review ambulation with cane and initiate balance training    PT Home Exercise Plan heel/toe raises, standing marching    Consulted and Agree with Plan of Care Patient           Patient will benefit from skilled therapeutic intervention in order to improve the following deficits and impairments:  Postural dysfunction,Decreased balance,Impaired flexibility,Abnormal gait,Decreased mobility,Difficulty walking,Decreased coordination  Visit Diagnosis: Intraparenchymal hemorrhage of brain (HCC)  Other abnormalities of gait and mobility     Problem List Patient Active Problem List   Diagnosis Date Noted  . Protein-calorie malnutrition, severe 01/19/2020  . Epistaxis   . Sleep disturbance   . Slow transit constipation   . Hemorrhoids   . Chronic systolic congestive heart failure ([place])   . ESRD on dialysis ([place])   . Right hemiparesis ([place])   . Pressure injury of skin 12/20/2019  . Intraparenchymal hemorrhage of brain ([place]) 12/18/2019  . ICH (intracerebral hemorrhage) ([place]) 12/12/2019  . Viral disease 08/22/2019  . Chronic right-sided heart failure ([place]) 07/09/2019  . Supraventricular tachycardia ([place]) 07/09/2019  . Other cirrhosis of liver ([place]) 06/27/2019  . Heart failure ([place]) 02/21/2019  . Heart palpitations 08/14/2018  . Other fluid overload 07/27/2018  . Encounter for long-term (current) use of other medications 01/18/2018  . Hypothyroidism 01/18/2018  . Myalgia and myositis 01/18/2018  . Chronic  nephritis 01/18/2018  . Other interstitial pulmonary diseases with fibrosis in diseases classified elsewhere ([place]) 01/18/2018  . Other long term (current) drug therapy 01/18/2018  . Sleep apnea 01/18/2018  . Unspecified persistent mental disorders due to conditions classified elsewhere 01/18/2018  . Venous reflux 01/18/2018  . Vitamin B12 deficiency 01/18/2018  . Vitamin D deficiency 01/18/2018  . Obstructive lung disease ([place]) 12/30/2017  . Other pruritus 12/27/2017  . Eruption cyst 12/19/2017  . 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 Chronic cough 11/11/2017  . Pulmonary hypertension ([place]) 11/11/2017  . Pulmonary arterial hypertension ([place]) 10/07/2017  . Acute ITP ([place]) 01/05/2017  . ESRD (end stage renal disease) ([place]) 12/28/2016  . Encounter for removal of sutures 09/09/2016  . Coagulation defect, unspecified ([place]) 09/01/2016  . Underimmunization status 08/05/2016  . Hemolytic anemia ([place]) 07/26/2016  . Epistaxis, recurrent 07/26/2016  . Unspecified protein-calorie malnutrition ([place]) 07/13/2016  . Aftercare including intermittent dialysis ([place]) 07/07/2016  . Anemia in chronic kidney disease 07/07/2016  . Hypokalemia 07/07/2016  . Iron deficiency anemia, unspecified 07/07/2016  . 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 Nonrheumatic mitral (valve) prolapse 07/07/2016  . Other irritable bowel syndrome 07/07/2016  . Other secondary thrombocytopenia 07/07/2016  . Secondary hyperparathyroidism of renal origin ([place]) 07/07/2016  . Thrombocytopenia ([place]) 07/01/2016  . ARF (acute renal failure) ([place]) 06/25/2016  . Anxiety 06/25/2016  . Bilateral carpal tunnel syndrome 10/22/2015  . Chronic gout without tophus 10/22/2015  .  Chronic nonintractable headache 10/08/2015  . Fibroid uterus 01/03/2012  . H/O vitamin D deficiency 01/03/2012  . Post-menopausal 01/03/2012  . Hereditary and idiopathic peripheral neuropathy 11/19/2011  . Intestinal  malabsorption 11/19/2011  . Lichen planus 68/08/8108  . Low back pain 11/19/2011  . Diffuse spasm of esophagus 11/11/2011  . ESR raised 11/11/2011  . Postinflammatory pulmonary fibrosis ([place]) 11/11/2011  . Scleroderma ([place]) 11/16/2010  . Rheumatoid arthritis ([place]) 11/16/2010  . Raynaud's disease 11/16/2010  . Symptomatic menopausal or female climacteric states 11/16/2010    Lanice Shirts PT 02/13/2020, 3:34 PM  [facility] [address] [place], [place], [address] Phone: [phone]   Fax:  [phone]  Name: LUDDIE BOGHOSIAN MRN: [id number] Date of Birth: 17-Apr-1955

## 2020-02-13 NOTE — Therapy (Signed)
[facility] [address] [place], [place], [address] Phone: [phone]   Fax:  [phone]  Occupational Therapy Evaluation  Patient Details  Name: Katie Nunez MRN: [id number] Date of Birth: 05-May-1955 Referring Provider (OT): Molli Barrows   Encounter Date: 02/13/2020   OT End of Session - 02/13/20 1604    Visit Number 1    Number of Visits 17    Date for OT Re-Evaluation 04/09/20    Authorization Type [employer]    Authorization Time Period 90 day cert    Authorization - Visit Number 1    Authorization - Number of Visits 10    Progress Note Due on Visit 10    OT Start Time 1451    OT Stop Time 1530    OT Time Calculation (min) 39 min           Past Medical History:  Diagnosis Date  . Achalasia   . Anxiety   . Dysplasia of cervix, low grade (CIN 1)   . Environmental allergies    "all year long" (12/27/2016)  . ESRD (end stage renal disease) on dialysis [facility])    "TTS; [place] [facility]" (12/27/2016)  . Fibromyalgia   . GERD (gastroesophageal reflux disease)   . Gout   . Hypertension   . IBS (irritable bowel syndrome)   . MVP (mitral valve prolapse)   . RA (rheumatoid arthritis) (HCC)    FOLLOWED BY DR. SHANAHAN  . Raynaud's disease   . Scleroderma ([place])   . Seasonal allergies   . Thrombocytopenia ([place]) 07/01/2016   Acute fall to 13,000 07/01/16  . Tubular adenoma 01/08/2008   CECUM  . Vitamin D deficiency     Past Surgical History:  Procedure Laterality Date  . ANKLE FRACTURE SURGERY Right   . AV FISTULA PLACEMENT Left 06/28/2016   Procedure: left arm ARTERIOVENOUS (AV) FISTULA CREATION;  Surgeon: Rosetta Posner, MD;  Location: [place];  Service: Vascular;  Laterality: Left;  . BASCILIC VEIN TRANSPOSITION Left 09/27/2016   Procedure: LEFT UPPER ARM CEPHALIC VEIN TRANSPOSITION;  Surgeon: Rosetta Posner, MD;  Location: [facility]certain;  Service: Vascular;  Laterality: Left;  . BREAST BIOPSY     "? side"  .  CESAREAN SECTION  1994  . CO2 LASER OF CERVIX    . COLONOSCOPY W/ BIOPSIES  01/08/2008  . INSERTION OF DIALYSIS CATHETER Right 06/28/2016   Procedure: INSERTION OF DIALYSIS CATHETER, right internal jugular;  Surgeon: Rosetta Posner, MD;  Location: [place] [facility];  Service: Vascular;  Laterality: Right;  . MYOMECTOMY    . NASAL ENDOSCOPY WITH EPISTAXIS CONTROL N/A 12/29/2019   Procedure: NASAL ENDOSCOPY WITH EPISTAXIS CONTROL;  Surgeon: Leta Baptist, MD;  Location: [facility];  Service: ENT;  Laterality: N/A;  . PELVIC LAPAROSCOPY  2011  . superficial thrombophlebitis Left 07-2014    There were no vitals filed for this visit.   Subjective Assessment - 02/13/20 1459    Pertinent History ICH 12/12/19, Scleroderma, RA,HTN, ESRD (dialysis), hx R ankle Fx, 2000, anxiety,fibromyalgia, gout, HTN, MVP, Raynaud's  disease, C section, ostructive lung disease, B CTS, hereditary and periferal neuropathy, venous reflux, ICH    Patient Stated Goals write and use RUE better, drive agin    Currently in Pain? No/denies             [facility] OT Assessment - 02/13/20 1500      Assessment   Medical Diagnosis ICH    Referring Provider (OT) Tamala Julian,  [place]    Onset Date/Surgical Date 12/12/19    Hand Dominance Right    Prior Therapy CIR, HH      Precautions   Precautions Fall    Precaution Comments no BP in LUE due to fistula, no driving, no strenous activity per hospital d/c   dialysis Tues, Thurs Sat     Balance Screen   Has the patient fallen in the past 6 months No    Has the patient had a decrease in activity level because of a fear of falling?  No    Is the patient reluctant to leave their home because of a fear of falling?  No      Home  Environment   Family/patient expects to be discharged to: Private residence    Living Arrangements Spouse/significant other    Available Help at [facility] [place]      Prior Function   Level of  [place] Retired      ADL   Eating/Feeding Modified independent    Grooming Modified independent    Upper Body Bathing Modified independent    Lower Body Bathing Modified independent    Upper Body Dressing Independent    Lower Body Dressing Modified independent    Toilet Transfer Modified independent    Tub/Shower Transfer Supervision/safety      IADL   Shopping Needs to be accompanied on any shopping trip    Light Housekeeping Needs help with all home maintenance tasks    Meal Prep Needs to have meals prepared and served    Medication Management Is responsible for taking medication in correct dosages at correct time    Financial Management Requires assistance      Mobility   Mobility Status --   modified I     Written Expression   Dominant Hand Right    Handwriting 90% legible   for 1 sentence, tremor/ ataxia present     Vision Assessment   Vision Assessment Vision not tested      Cognition   Overall Cognitive Status Within Functional Limits for tasks assessed      Sensation   Light Touch Not tested      Coordination   Gross Motor Movements are Fluid and Coordinated No    Fine Motor Movements are Fluid and Coordinated No    9 Hole Peg Test Right;Left    Right 9 Hole Peg Test 60.56    Left 9 Hole Peg Test 32.81    Tremors tremor/ ataxia present RUE with movement    Coordination impaired for RUE      ROM / Strength   AROM / PROM / Strength AROM      AROM   Overall AROM  Deficits    Overall AROM Comments Deficits, decreased RUe shoulder flexion, difficulty with RUE interal rotation    AROM Assessment Site Shoulder    Right Shoulder Flexion 120 Degrees   left shoulder flexion 130   Left Shoulder Flexion 130 Degrees      Strength   Overall Strength Deficits    Overall Strength Comments RUE grossly 3-3+/5, LUE 4/5 -4+/5      Hand Function   Right Hand Grip (lbs) 37.6    Left Hand Grip (lbs) 42.5  OT Short Term Goals - 02/13/20 1544      OT SHORT TERM GOAL #1   Title I with inital HEP.    Time 4    Period Weeks    Status New      OT SHORT TERM GOAL #2   Title Pt will demonstrate improved fine motor coordination for ADLs as evidenced by decreasing 9 hole peg test score for RUE to 52 secs or less    Baseline RUE60.56, LUE 32.81    Time 4    Period Weeks    Status New      OT SHORT TERM GOAL #3   Title Pt will perform light home management/simple cooking tasks with supervision and min v.c    Time 4    Period Weeks    Status New      OT SHORT TERM GOAL #4   Title Pt will demonstrate improved RUE functional use as evidenced by increasing box/ blocks score to 25 blocks with RUE.    Time 4    Period Weeks    Status New      OT SHORT TERM GOAL #5   Title Pt will write a short paragraph with at least 90% legibility.    Time 4    Period Weeks    Status New      Additional Short Term Goals   Additional Short Term Goals Yes             OT Long Term Goals - 02/13/20 1550      OT LONG TERM GOAL #1   Title I with updated HEP.-04/09/20    Time 8    Period Weeks    Status New    Target Date 04/09/20      OT LONG TERM GOAL #2   Title Pt will perform basic home managment/ cooking modified Independently    Time 8    Period Weeks    Status New      OT LONG TERM GOAL #3   Title Pt will demonstrate improved fine motor coordination for ADLs as evidenced by decreasing 9 hole peg test score for RUE to 45 secs or less.    Time 8    Period Weeks    Status New      OT LONG TERM GOAL #4   Title Pt will write a short paragraph with 100% legibility.    Time 8    Period Weeks    Status New      OT LONG TERM GOAL #5   Title Pt will demonstrate ability to retrieve a lightweight object at 125 shoulder flexion with RUE demonstrating good control.    Time 8    Period Weeks    Status New                 Plan - 02/13/20 1538     Clinical Impression Statement The pt is a 65 yo female hospitalized 12/12/19 with acute R-sided weakness, found to have L frontal ICH.Pt received therapies at CIR and home health. Pt was d/c home 01/24/20. PMH includes: anxiety, ESRD on HD TTS, HTN, IBS, RA, fibromyalgia, a fib on Coumadin.  Pt has dialysis on Tues, Thurs, Saturday. Pt.presents with the following deficits : Decreased balance, decreased coordination, decreased RUE strength, decreased RUE functional use which impedes performance of ADLS/IADLS. Pt. can benefit from skilled occupational therapy to address the deficits in order to maximize pt's safety and I with daily activities.  OT Occupational Profile and History Detailed Assessment- Review of Records and additional review of physical, cognitive, psychosocial history related to current functional performance    Occupational performance deficits (Please refer to evaluation for details): ADL's;IADL's;Leisure;Rest and Sleep;Social Participation;Other    Body Structure / Function / Physical Skills ADL;Endurance;Decreased knowledge of precautions;Decreased knowledge of use of DME;Flexibility;IADL;Pain;Skin integrity;Edema;Mobility;ROM    Rehab Potential Good    Clinical Decision Making Limited treatment options, no task modification necessary    Comorbidities Affecting Occupational Performance: May have comorbidities impacting occupational performance    Comorbidities impacting occupational performance description: multiple inflamatory rheumatological conditions, kidney disease, vascular reflux,    Modification or Assistance to Complete Evaluation  No modification of tasks or assist necessary to complete eval    OT Frequency 2x / week   or 16 visits total, pt is currently scheduled 2x week x 4 weeks followed by 1x week x 4 weeks, plus eval   OT Duration 8 weeks    OT Treatment/Interventions Self-care/ADL training;Therapeutic exercise;DME and/or AE instruction;Wellsite geologist;Compression bandaging;Other (comment);Manual Therapy;Scar mobilization;Coping strategies training;Energy conservation;Manual lymph drainage;Passive range of motion;Therapeutic activities    Plan initiate HEP for RUE coordination/ functional use    Consulted and Agree with Plan of Care Patient           Patient will benefit from skilled therapeutic intervention in order to improve the following deficits and impairments:   Body Structure / Function / Physical Skills: ADL,Endurance,Decreased knowledge of precautions,Decreased knowledge of use of DME,Flexibility,IADL,Pain,Skin integrity,Edema,Mobility,ROM       Visit Diagnosis: Muscle weakness (generalized) - Plan: Ot plan of care cert/re-cert  Other lack of coordination - Plan: Ot plan of care cert/re-cert  Unsteadiness on feet - Plan: Ot plan of care cert/re-cert  Other abnormalities of gait and mobility - Plan: Ot plan of care cert/re-cert  Other symptoms and signs involving the nervous system - Plan: Ot plan of care cert/re-cert    Problem List Patient Active Problem List   Diagnosis Date Noted  . Protein-calorie malnutrition, severe 01/19/2020  . Epistaxis   . Sleep disturbance   . Slow transit constipation   . Hemorrhoids   . Chronic systolic congestive heart failure ([place])   . ESRD on dialysis ([place])   . Right hemiparesis ([place])   . Pressure injury of skin 12/20/2019  . Intraparenchymal hemorrhage of brain ([place]) 12/18/2019  . ICH (intracerebral hemorrhage) ([place]) 12/12/2019  . Viral disease 08/22/2019  . Chronic right-sided heart failure ([place]) 07/09/2019  . Supraventricular tachycardia ([place]) 07/09/2019  . Other cirrhosis of liver ([place]) 06/27/2019  . Heart failure ([place]) 02/21/2019  . Heart palpitations 08/14/2018  . Other fluid overload 07/27/2018  . Encounter for long-term (current) use of other medications 01/18/2018  . Hypothyroidism 01/18/2018  . Myalgia and myositis 01/18/2018  . Chronic nephritis  01/18/2018  . Other interstitial pulmonary diseases with fibrosis in diseases classified elsewhere ([place]) 01/18/2018  . Other long term (current) drug therapy 01/18/2018  . Sleep apnea 01/18/2018  . Unspecified persistent mental disorders due to conditions classified elsewhere 01/18/2018  . Venous reflux 01/18/2018  . Vitamin B12 deficiency 01/18/2018  . Vitamin D deficiency 01/18/2018  . Obstructive lung disease ([place]) 12/30/2017  . Other pruritus 12/27/2017  . Eruption cyst 12/19/2017  . 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 Chronic cough 11/11/2017  . Pulmonary hypertension ([place]) 11/11/2017  . Pulmonary arterial hypertension ([place]) 10/07/2017  . Acute ITP ([place]) 01/05/2017  . ESRD (end stage renal  disease) ([place]) 12/28/2016  . Encounter for removal of sutures 09/09/2016  . Coagulation defect, unspecified ([place]) 09/01/2016  . Underimmunization status 08/05/2016  . Hemolytic anemia ([place]) 07/26/2016  . Epistaxis, recurrent 07/26/2016  . Unspecified protein-calorie malnutrition ([place]) 07/13/2016  . Aftercare including intermittent dialysis ([place]) 07/07/2016  . Anemia in chronic kidney disease 07/07/2016  . Hypokalemia 07/07/2016  . Iron deficiency anemia, unspecified 07/07/2016  . 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 Nonrheumatic mitral (valve) prolapse 07/07/2016  . Other irritable bowel syndrome 07/07/2016  . Other secondary thrombocytopenia 07/07/2016  . Secondary hyperparathyroidism of renal origin ([place]) 07/07/2016  . Thrombocytopenia ([place]) 07/01/2016  . ARF (acute renal failure) ([place]) 06/25/2016  . Anxiety 06/25/2016  . Bilateral carpal tunnel syndrome 10/22/2015  . Chronic gout without tophus 10/22/2015  . Chronic nonintractable headache 10/08/2015  . Fibroid uterus 01/03/2012  . H/O vitamin D deficiency 01/03/2012  . Post-menopausal 01/03/2012  . Hereditary and idiopathic peripheral neuropathy 11/19/2011  . Intestinal  malabsorption 11/19/2011  . Lichen planus 44/17/1278  . Low back pain 11/19/2011  . Diffuse spasm of esophagus 11/11/2011  . ESR raised 11/11/2011  . Postinflammatory pulmonary fibrosis ([place]) 11/11/2011  . Scleroderma ([place]) 11/16/2010  . Rheumatoid arthritis ([place]) 11/16/2010  . Raynaud's disease 11/16/2010  . Symptomatic menopausal or female climacteric states 11/16/2010    RINE,KATHRYN 02/13/2020, 4:10 PM Theone Murdoch, OTR/L [phone] Phone: [phone] 4:10 PM 02/13/20 [facility] [address] [place], [place], [address] Phone: [phone]   Fax:  [phone]  Name: Katie Nunez MRN: [id number] Date of Birth: 1955-11-03

## 2020-02-13 NOTE — Patient Instructions (Signed)
Access Code: [email or web address] URL: [email or web address] Date: 02/13/2020 Prepared by: Sharlynn Oliphant  Exercises Standing Heel Raise with Support - 1 x daily - 5 x weekly - 2 sets - 10 reps Standing March with Counter Support - 1 x daily - 5 x weekly - 2 sets - 10 reps

## 2020-02-18 ENCOUNTER — Encounter: Payer: Self-pay | Admitting: Occupational Therapy

## 2020-02-18 ENCOUNTER — Ambulatory Visit: Payer: Medicare Other | Admitting: Occupational Therapy

## 2020-02-18 ENCOUNTER — Other Ambulatory Visit: Payer: Self-pay

## 2020-02-18 DIAGNOSIS — R278 Other lack of coordination: Secondary | ICD-10-CM

## 2020-02-18 DIAGNOSIS — R2689 Other abnormalities of gait and mobility: Secondary | ICD-10-CM

## 2020-02-18 DIAGNOSIS — R2681 Unsteadiness on feet: Secondary | ICD-10-CM

## 2020-02-18 DIAGNOSIS — I619 Nontraumatic intracerebral hemorrhage, unspecified: Secondary | ICD-10-CM | POA: Diagnosis not present

## 2020-02-18 DIAGNOSIS — R29818 Other symptoms and signs involving the nervous system: Secondary | ICD-10-CM

## 2020-02-18 DIAGNOSIS — M6281 Muscle weakness (generalized): Secondary | ICD-10-CM

## 2020-02-18 NOTE — Patient Instructions (Addendum)
  Coordination Activities  Perform the following activities for 20  minutes 2 times per day with right hand(s).   Rotate ball in fingertips (clockwise and counter-clockwise).  Toss ball between hands.  Toss ball in air and catch with the same hand.  Flip cards 1 at a time as fast as you can.  Deal cards with your thumb (Hold deck in hand and push card off top with thumb).  Rotate card in hand (clockwise and counter-clockwise).  Pick up coins, buttons, marbles, dried beans/pasta of different sizes and place in container.  Pick up coins and place in container or coin bank.  Pick up coins and stack.  Pick up coins one at a time until you get 5-10 in your hand, then move coins from palm to fingertips to stack one at a time.  Twirl pen between fingers.  Practice writing and/or typing.  Screw together nuts and bolts, then unfasten.    SHOULDER: Flexion On Table    Place hands on table, elbows straight. Move hips away from body. Press hands down into table. Hold _5-10_ seconds. _10_ reps per set, 3 sets per day, 7_ days per week   Copyright  VHI. All rights reserved.

## 2020-02-18 NOTE — Therapy (Signed)
[place] [address] [place], [place], [address] Phone: [phone]   Fax:  [phone]  Occupational Therapy Treatment  Patient Details  Name: Katie Nunez MRN: [id number] Date of Birth: [birth year] Referring Provider (OT): Molli Barrows   Encounter Date: 02/18/2020   OT End of Session - 02/18/20 1544    Visit Number 2    Number of Visits 17    Date for OT Re-Evaluation 04/09/20    Authorization Type [employer] Medicare    Authorization Time Period 90 day cert    Authorization - Visit Number 2    Authorization - Number of Visits 10    Progress Note Due on Visit 10    OT Start Time 1541   pt arrived late   OT Stop Time 1615    OT Time Calculation (min) 34 min    Activity Tolerance Patient tolerated treatment well    Behavior During Therapy [facility] for tasks assessed/performed           Past Medical History:  Diagnosis Date  . Achalasia   . Anxiety   . Dysplasia of cervix, low grade (CIN 1)   . Environmental allergies    "all year long" (12/27/2016)  . ESRD (end stage renal disease) on dialysis [facility])    "TTS; [place]" (12/27/2016)  . Fibromyalgia   . GERD (gastroesophageal reflux disease)   . Gout   . Hypertension   . IBS (irritable bowel syndrome)   . MVP (mitral valve prolapse)   . RA (rheumatoid arthritis) (HCC)    FOLLOWED BY DR. SHANAHAN  . Raynaud's disease   . Scleroderma ([place])   . Seasonal allergies   . Thrombocytopenia ([place]) 07/01/2016   Acute fall to 13,000 07/01/16  . Tubular adenoma 01/08/2008   CECUM  . Vitamin D deficiency     Past Surgical History:  Procedure Laterality Date  . ANKLE FRACTURE SURGERY Right   . AV FISTULA PLACEMENT Left 06/28/2016   Procedure: left arm ARTERIOVENOUS (AV) FISTULA CREATION;  Surgeon: Rosetta Posner, MD;  Location: [place];  Service: Vascular;  Laterality: Left;  . BASCILIC VEIN TRANSPOSITION Left 09/27/2016   Procedure: LEFT UPPER ARM CEPHALIC VEIN  TRANSPOSITION;  Surgeon: Rosetta Posner, MD;  Location: [place];  Service: Vascular;  Laterality: Left;  . BREAST BIOPSY     "? side"  . CESAREAN SECTION  1994  . CO2 LASER OF CERVIX    . COLONOSCOPY W/ BIOPSIES  01/08/2008  . INSERTION OF DIALYSIS CATHETER Right 06/28/2016   Procedure: INSERTION OF DIALYSIS CATHETER, right internal jugular;  Surgeon: Rosetta Posner, MD;  Location: [place];  Service: Vascular;  Laterality: Right;  . MYOMECTOMY    . NASAL ENDOSCOPY WITH EPISTAXIS CONTROL N/A 12/29/2019   Procedure: NASAL ENDOSCOPY WITH EPISTAXIS CONTROL;  Surgeon: Leta Baptist, MD;  Location: [facility] OR;  Service: ENT;  Laterality: N/A;  . PELVIC LAPAROSCOPY  2011  . superficial thrombophlebitis Left 07-2014    There were no vitals filed for this visit.   Subjective Assessment - 02/18/20 1622    Subjective  Pt reports some discomfort in right hand (tightness).    Pertinent History Scleroderma, RA,HTN, ESRD (dialysis), hx R ankle Fx, 2000, anxiety,fibromyalgia, gout, HTN, MVP, Raynaud's  disease, C section, ostructive lung disease, B CTS, hereditary and periferal neuropathy, venous reflux, ICH    Limitations NO BP LUE due to fistula    Patient Stated Goals write and use RUE better, drive  agin    Currently in Pain? Yes    Pain Score 3     Pain Location Hand    Pain Orientation Right    Pain Descriptors / Indicators Tightness    Pain Type Acute pain    Pain Onset 1 to 4 weeks ago    Pain Frequency Intermittent                        OT Treatments/Exercises (OP) - 02/18/20 1611      Exercises   Exercises Hand      Fine Motor Coordination (Hand/Wrist)   Fine Motor Coordination Manipulation of small objects;Handwriting    Manipulation of small objects see coordination HEPs    Handwriting lower case "l" loops for increasing coordination. good RUE movement across paper but shaky lines for lower case l's. got smaller as fatigue set in and going across paper                   OT Education - 02/18/20 1550    Education Details Coordination HEP + Table Slides    Person(s) Educated Patient    Methods Explanation;Demonstration;Handout    Comprehension Verbalized understanding;Returned demonstration            OT Short Term Goals - 02/13/20 1544      OT SHORT TERM GOAL #1   Title I with inital HEP.    Time 4    Period Weeks    Status New      OT SHORT TERM GOAL #2   Title Pt will demonstrate improved fine motor coordination for ADLs as evidenced by decreasing 9 hole peg test score for RUE to 52 secs or less    Baseline RUE60.56, LUE 32.81    Time 4    Period Weeks    Status New      OT SHORT TERM GOAL #3   Title Pt will perform light home management/simple cooking tasks with supervision and min v.c    Time 4    Period Weeks    Status New      OT SHORT TERM GOAL #4   Title Pt will demonstrate improved RUE functional use as eveidenced by increasing box/ blocks score to 25 blocks with RUE.    Time 4    Period Weeks    Status New      OT SHORT TERM GOAL #5   Title Pt will write a short paragraph with at least 90% legibility.    Time 4    Period Weeks    Status New      Additional Short Term Goals   Additional Short Term Goals Yes             OT Long Term Goals - 02/13/20 1550      OT LONG TERM GOAL #1   Title I with updated HEP.-04/09/20    Time 8    Period Weeks    Status New    Target Date 04/09/20      OT LONG TERM GOAL #2   Title Pt will perform basic home managment/ cooking modified Independently    Time 8    Period Weeks    Status New      OT LONG TERM GOAL #3   Title Pt will demonstrate improved fine motor coordination for ADLs as evidenced by decreasing 9 hole peg test score for RUE to 45 secs or less.    Time 8  Period Weeks    Status New      OT LONG TERM GOAL #4   Title Pt will write a short paragraph with 100% legibility.    Time 8    Period Weeks    Status New      OT LONG TERM GOAL #5   Title Pt will  demonstrate ability to retrieve a lightweight object at 125 shoulder flexion with RUE demonstrating good control.    Time 8    Period Weeks    Status New                 Plan - 02/18/20 1544    Clinical Impression Statement Pt is progress towards goals - first session since evaluation.    OT Occupational Profile and History Detailed Assessment- Review of Records and additional review of physical, cognitive, psychosocial history related to current functional performance    Occupational performance deficits (Please refer to evaluation for details): ADL's;IADL's;Leisure;Rest and Sleep;Social Participation;Other    Body Structure / Function / Physical Skills ADL;Endurance;Decreased knowledge of precautions;Decreased knowledge of use of DME;Flexibility;IADL;Pain;Skin integrity;Edema;Mobility;ROM    Rehab Potential Good    Clinical Decision Making Limited treatment options, no task modification necessary    Comorbidities Affecting Occupational Performance: May have comorbidities impacting occupational performance    Comorbidities impacting occupational performance description: multiple inflamatory rheumatological conditions, kidney disease, vascular reflux,    Modification or Assistance to Complete Evaluation  No modification of tasks or assist necessary to complete eval    OT Frequency 2x / week   or 16 visits total, pt is currently scheduled 2x week x 4 weeks followed by 1x week x 4 weeks, plus eval   OT Duration 8 weeks    OT Treatment/Interventions Self-care/ADL training;Therapeutic exercise;DME and/or AE instruction;Therapist, nutritional;Compression bandaging;Other (comment);Manual Therapy;Scar mobilization;Coping strategies training;Energy conservation;Manual lymph drainage;Passive range of motion;Therapeutic activities    Plan coordination/ functional use, handwriting    Consulted and Agree with Plan of Care Patient           Patient will benefit from skilled therapeutic  intervention in order to improve the following deficits and impairments:   Body Structure / Function / Physical Skills: ADL,Endurance,Decreased knowledge of precautions,Decreased knowledge of use of DME,Flexibility,IADL,Pain,Skin integrity,Edema,Mobility,ROM       Visit Diagnosis: Muscle weakness (generalized)  Other lack of coordination  Unsteadiness on feet  Other abnormalities of gait and mobility  Other symptoms and signs involving the nervous system    Problem List Patient Active Problem List   Diagnosis Date Noted  . Protein-calorie malnutrition, severe 01/19/2020  . Epistaxis   . Sleep disturbance   . Slow transit constipation   . Hemorrhoids   . Chronic systolic congestive heart failure ([place])   . ESRD on dialysis ([place])   . Right hemiparesis ([place])   . Pressure injury of skin 12/20/2019  . Intraparenchymal hemorrhage of brain ([place]) 12/18/2019  . ICH (intracerebral hemorrhage) ([place]) 12/12/2019  . Viral disease 08/22/2019  . Chronic right-sided heart failure ([place]) 07/09/2019  . Supraventricular tachycardia ([place]) 07/09/2019  . Other cirrhosis of liver ([place]) 06/27/2019  . Heart failure ([place]) 02/21/2019  . Heart palpitations 08/14/2018  . Other fluid overload 07/27/2018  . Encounter for long-term (current) use of other medications 01/18/2018  . Hypothyroidism 01/18/2018  . Myalgia and myositis 01/18/2018  . Chronic nephritis 01/18/2018  . Other interstitial pulmonary diseases with fibrosis in diseases classified elsewhere ([place]) 01/18/2018  . Other long term (current) drug therapy 01/18/2018  .  Sleep apnea 01/18/2018  . Unspecified persistent mental disorders due to conditions classified elsewhere 01/18/2018  . Venous reflux 01/18/2018  . Vitamin B12 deficiency 01/18/2018  . Vitamin D deficiency 01/18/2018  . Obstructive lung disease ([place]) 12/30/2017  . Other pruritus 12/27/2017  . Eruption cyst 12/19/2017  . Pulmonary artery hypertension associated with  connective tissue disease ([place]) 11/28/2017  . Chronic cough 11/11/2017  . Pulmonary hypertension ([place]) 11/11/2017  . Pulmonary arterial hypertension ([place]) 10/07/2017  . Acute ITP ([place]) 01/05/2017  . ESRD (end stage renal disease) ([place]) 12/28/2016  . Encounter for removal of sutures 09/09/2016  . Coagulation defect, unspecified ([place]) 09/01/2016  . Underimmunization status 08/05/2016  . Hemolytic anemia ([place]) 07/26/2016  . Epistaxis, recurrent 07/26/2016  . Unspecified protein-calorie malnutrition ([place]) 07/13/2016  . Aftercare including intermittent dialysis ([place]) 07/07/2016  . Anemia in chronic kidney disease 07/07/2016  . Hypokalemia 07/07/2016  . Iron deficiency anemia, unspecified 07/07/2016  . 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 Nonrheumatic mitral (valve) prolapse 07/07/2016  . Other irritable bowel syndrome 07/07/2016  . Other secondary thrombocytopenia 07/07/2016  . Secondary hyperparathyroidism of renal origin ([place]) 07/07/2016  . Thrombocytopenia ([place]) 07/01/2016  . ARF (acute renal failure) ([place]) 06/25/2016  . Anxiety 06/25/2016  . Bilateral carpal tunnel syndrome 10/22/2015  . Chronic gout without tophus 10/22/2015  . Chronic nonintractable headache 10/08/2015  . Fibroid uterus 01/03/2012  . H/O vitamin D deficiency 01/03/2012  . Post-menopausal 01/03/2012  . Hereditary and idiopathic peripheral neuropathy 11/19/2011  . Intestinal malabsorption 11/19/2011  . Lichen planus 38/18/4037  . Low back pain 11/19/2011  . Diffuse spasm of esophagus 11/11/2011  . ESR raised 11/11/2011  . Postinflammatory pulmonary fibrosis ([place]) 11/11/2011  . Scleroderma ([place]) 11/16/2010  . Rheumatoid arthritis ([place]) 11/16/2010  . Raynaud's disease 11/16/2010  . Symptomatic menopausal or female climacteric states 11/16/2010    Zachery Conch MOT, OTR/L  02/18/2020, 4:29 PM  [facility] [address] [place], [place],  [address] Phone: [phone]   Fax:  [phone]  Name: Katie Nunez MRN: [id number] Date of Birth: May 11, 1955

## 2020-02-20 ENCOUNTER — Other Ambulatory Visit: Payer: Self-pay

## 2020-02-20 ENCOUNTER — Ambulatory Visit: Payer: Medicare Other

## 2020-02-20 ENCOUNTER — Encounter: Payer: Self-pay | Admitting: Adult Health

## 2020-02-20 ENCOUNTER — Ambulatory Visit: Payer: Medicare Other | Admitting: Physical Therapy

## 2020-02-20 ENCOUNTER — Ambulatory Visit: Payer: Medicare Other | Admitting: Adult Health

## 2020-02-20 ENCOUNTER — Telehealth: Payer: Self-pay | Admitting: Adult Health

## 2020-02-20 VITALS — BP 133/72 | HR 88 | Ht 65.0 in | Wt 120.0 lb

## 2020-02-20 DIAGNOSIS — R2689 Other abnormalities of gait and mobility: Secondary | ICD-10-CM

## 2020-02-20 DIAGNOSIS — M25571 Pain in right ankle and joints of right foot: Secondary | ICD-10-CM

## 2020-02-20 DIAGNOSIS — I619 Nontraumatic intracerebral hemorrhage, unspecified: Secondary | ICD-10-CM | POA: Diagnosis not present

## 2020-02-20 DIAGNOSIS — M25572 Pain in left ankle and joints of left foot: Secondary | ICD-10-CM

## 2020-02-20 DIAGNOSIS — M6281 Muscle weakness (generalized): Secondary | ICD-10-CM

## 2020-02-20 DIAGNOSIS — R2681 Unsteadiness on feet: Secondary | ICD-10-CM

## 2020-02-20 DIAGNOSIS — G8191 Hemiplegia, unspecified affecting right dominant side: Secondary | ICD-10-CM

## 2020-02-20 DIAGNOSIS — M25671 Stiffness of right ankle, not elsewhere classified: Secondary | ICD-10-CM

## 2020-02-20 DIAGNOSIS — M25672 Stiffness of left ankle, not elsewhere classified: Secondary | ICD-10-CM

## 2020-02-20 MED ORDER — CYCLOBENZAPRINE HCL 5 MG PO TABS: 5.0000 mg | ORAL_TABLET | Freq: Three times a day (TID) | ORAL | 4 refills | Status: DC | PRN

## 2020-02-20 NOTE — Therapy (Signed)
[place] [address] [place], [place], [address] Phone: [phone]   Fax:  [phone]  Physical Therapy Treatment  Patient Details  Name: Katie Nunez MRN: [id number] Date of Birth: [birth year] Referring Provider (PT): [name] MD   Encounter Date: 02/20/2020   PT End of Session - 02/20/20 0856    Visit Number 2    Number of Visits 12    Date for PT Re-Evaluation 04/13/20    PT Start Time 0855    PT Stop Time 0935    PT Time Calculation (min) 40 min    Equipment Utilized During Treatment Gait belt    Activity Tolerance Patient tolerated treatment well    Behavior During Therapy [facility] for tasks assessed/performed           Past Medical History:  Diagnosis Date  . Achalasia   . Anxiety   . Dysplasia of cervix, low grade (CIN 1)   . Environmental allergies    "all year long" (12/27/2016)  . ESRD (end stage renal disease) on dialysis [facility])    "TTS; [place]" (12/27/2016)  . Fibromyalgia   . GERD (gastroesophageal reflux disease)   . Gout   . Hypertension   . IBS (irritable bowel syndrome)   . MVP (mitral valve prolapse)   . RA (rheumatoid arthritis) (HCC)    FOLLOWED BY DR. SHANAHAN  . Raynaud's disease   . Scleroderma ([place])   . Seasonal allergies   . Thrombocytopenia ([place]) 07/01/2016   Acute fall to 13,000 07/01/16  . Tubular adenoma 01/08/2008   CECUM  . Vitamin D deficiency     Past Surgical History:  Procedure Laterality Date  . ANKLE FRACTURE SURGERY Right   . AV FISTULA PLACEMENT Left 06/28/2016   Procedure: left arm ARTERIOVENOUS (AV) FISTULA CREATION;  Surgeon: Rosetta Posner, MD;  Location: [place];  Service: Vascular;  Laterality: Left;  . BASCILIC VEIN TRANSPOSITION Left 09/27/2016   Procedure: LEFT UPPER ARM CEPHALIC VEIN TRANSPOSITION;  Surgeon: Rosetta Posner, MD;  Location: [place];  Service: Vascular;  Laterality: Left;  . BREAST BIOPSY     "? side"  . CESAREAN SECTION  1994   . CO2 LASER OF CERVIX    . COLONOSCOPY W/ BIOPSIES  01/08/2008  . INSERTION OF DIALYSIS CATHETER Right 06/28/2016   Procedure: INSERTION OF DIALYSIS CATHETER, right internal jugular;  Surgeon: Rosetta Posner, MD;  Location: [place];  Service: Vascular;  Laterality: Right;  . MYOMECTOMY    . NASAL ENDOSCOPY WITH EPISTAXIS CONTROL N/A 12/29/2019   Procedure: NASAL ENDOSCOPY WITH EPISTAXIS CONTROL;  Surgeon: Leta Baptist, MD;  Location[facility];  Service: ENT;  Laterality: N/A;  . PELVIC LAPAROSCOPY  2011  . superficial thrombophlebitis Left 07-2014    There were no vitals filed for this visit.     Heel raises: 2 x 10 bil Sit to stand: 5 lbs kettle ball 10x, no HHA Static standing with long fwd steps and lateral steps: 10x R and L, in each direction. Gait training: st. Cane with tripod base, cane in L hand, educated on 2 point gait pattern, moderate cueing for proper sequencing and cues to maintain shoulder width BOS to maintain balance. 1 x 115', 1 x 100'                         PT Education - 02/20/20 0926    Education Details Access Code: [email or web address]  URL:  [email or web address]  Date: 02/20/2020  Prepared by: Markus Jarvis    Exercises  Sit to Stand - 2 x daily - 7 x weekly - 10 reps  Standing Heel Raise with Support - 2 x daily - 7 x weekly - 1 sets - 20 reps    Person(s) Educated Patient    Methods Explanation    Comprehension Verbalized understanding            PT Short Term Goals - 02/13/20 1517      PT SHORT TERM GOAL #1   Title Ind with initial HEP and able to demo back to PT w/o need of VCs    Baseline no HEP    Time 2    Period Weeks    Status New    Target Date 02/27/20      PT SHORT TERM GOAL #2   Title Patient to obtain [facility] and bring it to PT sessions for adjustment and training    Baseline not using cane prior    Time 2    Period Weeks    Status New    Target Date 02/27/20             PT Long Term Goals - 02/13/20 1520       PT LONG TERM GOAL #1   Title Patient to improve 5x STS score to < 20 sec    Baseline 24 seconds    Time 8    Period Weeks    Status New    Target Date 04/09/20      PT LONG TERM GOAL #2   Title Patient to demo 4+/5 R ankle/knee strength throughout    Baseline 4/5 R ankle knee strength    Time 8    Period Weeks    Status New    Target Date 04/09/20      PT LONG TERM GOAL #3   Title Patient to decerease TUG time to ,13.5 sec w/o AD    Baseline 18 sec TUG average of 2 trials    Time 8    Period Weeks    Status New    Target Date 04/09/20      PT LONG TERM GOAL #4   Title Ambulate 1070f with LRAD across outdoor surfaces under distant S    Baseline limited to in home ambulation    Time 8    Period Weeks    Status New    Target Date 04/09/20      PT LONG TERM GOAL #5   Title Return to driving as appropriate    Baseline currently not driving    Time 8    Period Weeks    Status New    Target Date 04/09/20                 Plan - 02/20/20 0931    Clinical Impression Statement Pt initially was using cane on R hand. Pt kicked the cane 3 times walking from waiting area to gym. Pt was educated on proper use of cane in L hand and 2 pt gait pattern but will require further education for safety. Pt tends to cross her legs when walking and required cues to keep legs apart for safety.    Personal Factors and Comorbidities Age;Comorbidity 2    Comorbidities ESRD(HD T, Th, Sat), R ankle fx 2000    Examination-Participation Restrictions Community Activity    Clinical Decision Making Low    Rehab Potential Good    PT  Frequency 2x / week    PT Duration 4 weeks    PT Treatment/Interventions ADLs/Self Care Home Management;Cryotherapy;Electrical Stimulation;Ultrasound;Therapeutic exercise;Balance training;Patient/family education;Manual techniques;Dry needling;Therapeutic activities;Neuromuscular re-education;DME Instruction;Gait training;Stair training;Functional mobility training     PT Next Visit Plan Continue with gait training, progress to RLE strength exercises focused on ankle and knee, review ambulation with cane and initiate balance training    PT Home Exercise Plan heel/toe raises, standing marching    Consulted and Agree with Plan of Care Patient           Patient will benefit from skilled therapeutic intervention in order to improve the following deficits and impairments:  Postural dysfunction,Decreased balance,Impaired flexibility,Abnormal gait,Decreased mobility,Difficulty walking,Decreased coordination  Visit Diagnosis: Muscle weakness (generalized)  Unsteadiness on feet  Other abnormalities of gait and mobility  Stiffness of left ankle, not elsewhere classified  Stiffness of right ankle, not elsewhere classified  Pain in left ankle and joints of left foot  Pain in right ankle and joints of right foot     Problem List Patient Active Problem List   Diagnosis Date Noted  . Protein-calorie malnutrition, severe 01/19/2020  . Epistaxis   . Sleep disturbance   . Slow transit constipation   . Hemorrhoids   . Chronic systolic congestive heart failure ([place])   . ESRD on dialysis ([place])   . Right hemiparesis ([place])   . Pressure injury of skin 12/20/2019  . Intraparenchymal hemorrhage of brain ([place]) 12/18/2019  . ICH (intracerebral hemorrhage) ([place]) 12/12/2019  . Viral disease 08/22/2019  . Chronic right-sided heart failure ([place]) 07/09/2019  . Supraventricular tachycardia ([place]) 07/09/2019  . Other cirrhosis of liver ([place]) 06/27/2019  . Heart failure ([place]) 02/21/2019  . Heart palpitations 08/14/2018  . Other fluid overload 07/27/2018  . Encounter for long-term (current) use of other medications 01/18/2018  . Hypothyroidism 01/18/2018  . Myalgia and myositis 01/18/2018  . Chronic nephritis 01/18/2018  . Other interstitial pulmonary diseases with fibrosis in diseases classified elsewhere ([place]) 01/18/2018  . Other long term (current) drug  therapy 01/18/2018  . Sleep apnea 01/18/2018  . Unspecified persistent mental disorders due to conditions classified elsewhere 01/18/2018  . Venous reflux 01/18/2018  . Vitamin B12 deficiency 01/18/2018  . Vitamin D deficiency 01/18/2018  . Obstructive lung disease ([place]) 12/30/2017  . Other pruritus 12/27/2017  . Eruption cyst 12/19/2017  . 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 ([place]) 11/28/2017  . Chronic cough 11/11/2017  . Pulmonary hypertension ([place]) 11/11/2017  . Pulmonary arterial hypertension ([place]) 10/07/2017  . Acute ITP ([place]) 01/05/2017  . ESRD (end stage renal disease) ([place]) 12/28/2016  . Encounter for removal of sutures 09/09/2016  . Coagulation defect, unspecified ([place]) 09/01/2016  . Underimmunization status 08/05/2016  . Hemolytic anemia ([place]) 07/26/2016  . Epistaxis, recurrent 07/26/2016  . Unspecified protein-calorie malnutrition ([place]) 07/13/2016  . Aftercare including intermittent dialysis ([place]) 07/07/2016  . Anemia in chronic kidney disease 07/07/2016  . Hypokalemia 07/07/2016  . Iron deficiency anemia, unspecified 07/07/2016  . 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 Nonrheumatic mitral (valve) prolapse 07/07/2016  . Other irritable bowel syndrome 07/07/2016  . Other secondary thrombocytopenia 07/07/2016  . Secondary hyperparathyroidism of renal origin ([place]) 07/07/2016  . Thrombocytopenia ([place]) 07/01/2016  . ARF (acute renal failure) ([place]) 06/25/2016  . Anxiety 06/25/2016  . Bilateral carpal tunnel syndrome 10/22/2015  . Chronic gout without tophus 10/22/2015  . Chronic nonintractable headache 10/08/2015  . Fibroid uterus 01/03/2012  . H/O vitamin D deficiency 01/03/2012  . Post-menopausal 01/03/2012  .  Hereditary and idiopathic peripheral neuropathy 11/19/2011  . Intestinal malabsorption 11/19/2011  . Lichen planus 14/44/5848  . Low back pain 11/19/2011  . Diffuse spasm of esophagus 11/11/2011  . ESR raised 11/11/2011  .  Postinflammatory pulmonary fibrosis ([place]) 11/11/2011  . Scleroderma ([place]) 11/16/2010  . Rheumatoid arthritis ([place]) 11/16/2010  . Raynaud's disease 11/16/2010  . Symptomatic menopausal or female climacteric states 11/16/2010    Kerrie Pleasure 02/20/2020, 9:34 AM  [facility] [address] [place], [place], [address] Phone: [phone]   Fax:  [phone]  Name: Katie Nunez MRN: [id number] Date of Birth: 1955-03-21

## 2020-02-20 NOTE — Telephone Encounter (Signed)
Spoke to pt and relayed that she may receive booster (as no reason not to receive from neuro perspective).  She appreciated call back.

## 2020-02-20 NOTE — Telephone Encounter (Signed)
No indication not to receive the booster from a neurological perspective

## 2020-02-20 NOTE — Telephone Encounter (Signed)
Pt. Forgot to ask at appt earlier if she would be able to get her covid booster due to her recent health problems. Best call back is [phone].

## 2020-02-20 NOTE — Patient Instructions (Signed)
Your Plan:  You will be called to schedule repeat CT head to assess for resolution of your brain bleed.  If resolved, we will further consider restarting warfarin or possibly switching to a different blood thinner such as Eliquis -this will be further decided by Dr. Leonie Man and your cardiologist  Order placed for Flexeril 5mg  3 times daily as needed for muscle spasm/tightness  Continue working with neuro rehab therapies for likely ongoing recovery  Ensure continued follow-up with your PCP for aggressive stroke risk factor management including hypertension with BP goal less than 130/90 and cholesterol with LDL goal less than 70    Follow-up in 3 months or call earlier if needed     Thank you for coming to see [place] at [facility]. I hope we have been able to provide you high quality care today.  You may receive a patient satisfaction survey over the next few weeks. We would appreciate your feedback and comments so that we may continue to improve ourselves and the health of our patients.

## 2020-02-20 NOTE — Progress Notes (Signed)
[facility] [address] [place] [address] [phone]       HOSPITAL FOLLOW UP NOTE  [name] Date of Birth:  [birth year] Medical Record Number:  [id number]   Reason for Referral:  hospital stroke follow up    SUBJECTIVE:   CHIEF COMPLAINT:  Chief Complaint  Patient presents with  . Follow-up    Rm 29 with husband  PT is well     HPI:   [name] is a 65 y.o. female with history of ESRD on HD TTS, fibromyalgia,hypertension,mixed connective tissue disorder, scleroderma, pulmonaryhypertension,atrial fibrillation, cirrhosis-on Coumadin forportal vein thrombosis and atrial fibrillation who presented to [facility] ED on 12/12/2019 with R sided weakness. Personally reviewed hospitalization pertinent progress notes, lab work and imaging with summary provided. Evaluated by Dr. Erlinda Hong with stroke work-up revealing left frontal IPH likely secondary to warfarin coagulopathy. Warfarin was reversed with vitamin K and Kcentra with INR of 1.7 on admission and recommended follow-up outpatient to consider resuming warfarin or consider Eliquis once ICH resolves. History of HTN stable. LDL 79 and recommend consideration of statin at discharge for follow-up in setting of hemorrhage. No history or evidence of DM with A1c 5.2. Other stroke risk factors include ESRD on HD and OSA. Other active problems include anemia of ESRD, metabolic bone disease, pulmonary hypertension, RA, scleroderma and fibromyalgia. Residual deficit of right hemiplegia. Evaluated by therapies and discharged to CIR on 12/18/2019 for ongoing therapy needs.  Stroke:   L frontal IPH d/t warfarin coagulopathy   CT head acute L frontal IPH w/ mild edema  Repeat CT head stable L frontal IPH  Repeat CT head unchanged L frontal IPH  MRI  Subcortical L frontal white matter IPH w/ rim edema. few remote B hemorrhages upper cerebrum.  MRA  Unremarkable   2D Echo EF 65 to 70%  LDL  79  HgbA1c 5.2  VTE prophylaxis - SCDs   warfarin daily prior to admission, now on No antithrombotic given IPH  Therapy recommendations:  CIR  Disposition:  CIR   Today, 02/20/2020, Katie Nunez is being seen for hospital follow-up accompanied by her husband. She was discharged home from CIR on 01/23/2020. She has been doing well since discharge without new stroke/TIA symptoms and reports residual right-sided weakness and gait impairment with continued improvement.  Currently working with neuro rehab PT/OT.  She does report increased tone in RUE muscle tightness which is typically worse at night.  She was using Flexeril during CIR with benefit and questions if this could be restarted.  Ambulates with cane and denies any recent falls.  She is not using assistive device PTA but was using rolling walker initially at discharge.  She remains on atorvastatin 40 mg daily without myalgias.  Blood pressure today 133/72.  BP routinely monitored at dialysis.  No further concerns at this time.     ROS:   14 system review of systems performed and negative with exception of those listed in HPI  PMH:  Past Medical History:  Diagnosis Date  . Achalasia   . Anxiety   . Dysplasia of cervix, low grade (CIN 1)   . Environmental allergies    "all year long" (12/27/2016)  . ESRD (end stage renal disease) on dialysis [facility])    "TTS; [place] [facility]" (12/27/2016)  . Fibromyalgia   . GERD (gastroesophageal reflux disease)   . Gout   . Hypertension   . IBS (irritable bowel syndrome)   . MVP (mitral valve  prolapse)   . RA (rheumatoid arthritis) (HCC)    FOLLOWED BY DR. SHANAHAN  . Raynaud's disease   . Scleroderma ([facility])   . Seasonal allergies   . Thrombocytopenia ([facility]) 07/01/2016   Acute fall to 13,000 07/01/16  . Tubular adenoma 01/08/2008   CECUM  . Vitamin D deficiency     PSH:  Past Surgical History:  Procedure Laterality Date  . ANKLE FRACTURE SURGERY Right   . AV FISTULA PLACEMENT Left 06/28/2016    Procedure: left arm ARTERIOVENOUS (AV) FISTULA CREATION;  Surgeon: Rosetta Posner, MD;  Location: [facility];  Service: Vascular;  Laterality: Left;  . BASCILIC VEIN TRANSPOSITION Left 09/27/2016   Procedure: LEFT UPPER ARM CEPHALIC VEIN TRANSPOSITION;  Surgeon: Rosetta Posner, MD;  Location: [facility];  Service: Vascular;  Laterality: Left;  . BREAST BIOPSY     "? side"  . CESAREAN SECTION  1994  . CO2 LASER OF CERVIX    . COLONOSCOPY W/ BIOPSIES  01/08/2008  . INSERTION OF DIALYSIS CATHETER Right 06/28/2016   Procedure: INSERTION OF DIALYSIS CATHETER, right internal jugular;  Surgeon: Rosetta Posner, MD;  Location: [facility];  Service: Vascular;  Laterality: Right;  . MYOMECTOMY    . NASAL ENDOSCOPY WITH EPISTAXIS CONTROL N/A 12/29/2019   Procedure: NASAL ENDOSCOPY WITH EPISTAXIS CONTROL;  Surgeon: Leta Baptist, MD;  Location: [facility] OR;  Service: ENT;  Laterality: N/A;  . PELVIC LAPAROSCOPY  2011  . superficial thrombophlebitis Left 07-2014    Social History:  Social History   Socioeconomic History  . Marital status: Married    Spouse name: Not on file  . Number of children: Not on file  . Years of education: Not on file  . Highest education level: Not on file  Occupational History  . Not on file  Tobacco Use  . Smoking status: Never Smoker  . Smokeless tobacco: Never Used  Vaping Use  . Vaping Use: Never used  Substance and Sexual Activity  . Alcohol use: No  . Drug use: No  . Sexual activity: Not Currently    Birth control/protection: Post-menopausal  Other Topics Concern  . Not on file  Social History Narrative   Lives in [place] Determinants of Health   Financial Resource Strain: Not on file  Food Insecurity: Not on file  Transportation Needs: Not on file  Physical Activity: Not on file  Stress: Not on file  Social Connections: Not on file  Intimate Partner Violence: Not on file    Family History:  Family History  Problem Relation Age of Onset  . Hypertension Mother    . Diabetes Mother   . Heart disease Father   . Hypertension Maternal Aunt   . Diabetes Maternal Grandmother   . Heart disease Paternal Grandfather   . Cerebral palsy Cousin        1ST COUSIN?  . Diabetes Paternal Grandmother     Medications:   Current Outpatient Medications on File Prior to Visit  Medication Sig Dispense Refill  . ambrisentan (LETAIRIS) 5 MG tablet Take 5 mg by mouth daily.     Marland Kitchen atorvastatin (LIPITOR) 40 MG tablet Take 1 tablet (40 mg total) by mouth daily. 30 tablet 0  . azelastine (ASTELIN) 0.1 % nasal spray Place 1 spray into both nostrils 2 (two) times daily as needed for rhinitis. Use in each nostril as directed 30 mL 12  . calcium acetate (PHOSLO) 667 MG capsule Take 2 capsules (1,334 mg total) by  mouth 3 (three) times daily with meals. 180 capsule 0  . camphor-menthol (SARNA) lotion Apply topically 2 (two) times daily. 222 mL 0  . clonazePAM (KLONOPIN) 0.5 MG tablet Take 1 tablet (0.5 mg total) by mouth 2 (two) times daily as needed for anxiety. 30 tablet 0  . docusate sodium (COLACE) 100 MG capsule Take 2 capsules (200 mg total) by mouth daily. 60 capsule 0  . famotidine (PEPCID) 20 MG tablet Take 1 tablet (20 mg total) by mouth daily as needed for heartburn or indigestion. 30 tablet 0  . gabapentin (NEURONTIN) 100 MG capsule Take 1 capsule (100 mg total) by mouth 2 (two) times daily as needed. 60 capsule 0  . hydrocerin (EUCERIN) CREA Apply 1 application topically 2 (two) times daily. To dry skin--avoid IV site 454 g 0  . hydrocortisone (ANUSOL-HC) 2.5 % rectal cream Place rectally 2 (two) times daily as needed for hemorrhoids or anal itching. 30 g 0  . hydrOXYzine (ATARAX/VISTARIL) 25 MG tablet Take 25 mg by mouth every 8 (eight) hours as needed for itching.     . loratadine (CLARITIN) 10 MG tablet Take 1 tablet (10 mg total) by mouth daily. 30 tablet 0  . menthol-cetylpyridinium (CEPACOL) 3 MG lozenge Take 1 lozenge (3 mg total) by mouth as needed for sore  throat. 100 tablet 12  . multivitamin (RENA-VIT) TABS tablet Take 1 tablet by mouth daily at 6 (six) AM. 30 tablet 0  . oxymetazoline (AFRIN) 0.05 % nasal spray Place 1 spray into both nostrils 2 (two) times daily as needed for congestion. 30 mL 0  . pentoxifylline (TRENTAL) 400 MG CR tablet Take 1 tablet (400 mg total) by mouth daily. 30 tablet 0  . psyllium (HYDROCIL/METAMUCIL) 95 % PACK Take 1 packet by mouth 2 (two) times daily. 240 each   . Selexipag (UPTRAVI) 800 MCG TABS Take 1 tablet (800 mcg total) by mouth in the morning and at bedtime. 60 tablet 0  . sodium chloride (OCEAN) 0.65 % SOLN nasal spray Place 1 spray into both nostrils 5 (five) times daily. At least 5 times a day  0  . tadalafil, PAH, (ADCIRCA) 20 MG tablet Take 1 tablet (20 mg total) by mouth daily. 30 tablet 0   No current facility-administered medications on file prior to visit.    Allergies:   Allergies  Allergen Reactions  . Other Anaphylaxis    Do not use polyflux membrane.  Use alternate  . Savella [Milnacipran Hcl] Palpitations and Other (See Comments)    Unknown  . Tape Rash and Other (See Comments)    Itch- unsure if it was paper or adhesive tape      OBJECTIVE:  Physical Exam  Vitals:   02/20/20 0808  BP: 133/72  Pulse: 88  Weight: 120 lb (54.4 kg)  Height: 5\' 5"  (1.651 m)   Body mass index is 19.97 kg/m. No exam data present  Post stroke PHQ 2/9 Depression screen PHQ 2/9 02/05/2020  Decreased Interest 0  Down, Depressed, Hopeless 0  PHQ - 2 Score 0  Altered sleeping 1  Tired, decreased energy 1  Change in appetite 0  Feeling bad or failure about yourself  0  Trouble concentrating 0  Moving slowly or fidgety/restless 0  Suicidal thoughts 0  PHQ-9 Score 2  Some recent data might be hidden     General: Frail very pleasant middle-aged African-American female, seated, in no evident distress Head: head normocephalic and atraumatic.   Neck: supple with no  carotid or supraclavicular  bruits Cardiovascular: regular rate and rhythm, no murmurs Musculoskeletal: no deformity Skin:  no rash/petichiae; +bruit and +thrill LUE fistula Vascular:  Normal pulses all extremities   Neurologic Exam Mental Status: Awake and fully alert.  Fluent speech and language. Oriented to place and time. Recent and remote memory intact. Attention span, concentration and fund of knowledge appropriate. Mood and affect appropriate.  Cranial Nerves: Fundoscopic exam reveals sharp disc margins. Pupils equal, briskly reactive to light. Extraocular movements full without nystagmus. Visual fields full to confrontation. Hearing intact. Facial sensation intact. Face, tongue, palate moves normally and symmetrically.  Motor: Normal bulk and tone and strength LUE and LLE.  RUE: 3/5 with decreased hand dexterity and increased tone; RLE: 4/5 hip flexor and ankle dorsiflexion Sensory.: intact to touch , pinprick , position and vibratory sensation.  Coordination: Rapid alternating movements normal in all extremities. Finger-to-nose and heel-to-shin performed accurately on left side. RUE tremor with outstretched arm and finger-to-nose testing Gait and Station: Arises from chair without difficulty. Stance is normal. Gait demonstrates  hemiplegic gait with decreased RLE step height and mild unsteadiness with use of cane Reflexes: 2+ RUE and RLE; 1+ LUE and LLE. Toes downgoing.     NIHSS  1 Modified Rankin  3      ASSESSMENT: Katie Nunez is a 65 y.o. year old female presented with right-sided weakness on 12/12/2019 with stroke work-up revealing left frontal IPH secondary to warfarin coagulopathy. Vascular risk factors include HTN, HLD, atrial fibrillation, portal vein thrombosis, pulmonary hypertension, OSA and ESRD on HD.      PLAN:  1. L frontal IPH:  a. Residual deficit: Right hemiparesis with increased tone and gait impairment.  Continue participation with neuro rehab PT/OT for hopeful further recovery.   Order placed for Flexeril 5 mg 3 times daily as needed for increased tone and spasticity (patient previously on Flexeril during CIR admission which was tolerated and provided benefit) b. Repeat CT head and if resolution of IPH, will further discuss with Dr. Leonie Man and Dr. Gilles Chiquito [facility] cardiologist) regarding restarting warfarin or possibly transitioning to Eliquis c.  Continue atorvastatin 80 mg daily for secondary stroke prevention.   d. Discussed secondary stroke prevention measures and importance of close PCP follow up for aggressive stroke risk factor management  2. Atrial fibrillation: s/p ablation 09/2019.  Followed by [facility] cardiology Dr. Gilles Chiquito.  Previously on warfarin and once CT head completed, will further discuss restarting warfarin or transitioning to Eliquis for secondary stroke prevention 3. HTN: BP goal <130/90.  Well-controlled on nonpharmacological management 4. HLD: LDL goal <70. Recent LDL 79.  On atorvastatin 40 mg daily    Follow up in 3 months or call earlier if needed  CC:  [facility] provider: Dr. Darden Dates, Hal Hope, MD     I spent 45 minutes of face-to-face and non-face-to-face time with patient and husband.  This included previsit chart review including recent hospitalization pertinent progress notes, lab work and imaging, lab review, study review, order entry, electronic health record documentation, patient education regarding recent stroke including etiology, residual deficits, indication for repeat CT head and further [facility] options, importance of managing stroke risk factors and answered all other questions to patient satisfaction  Katie Nunez, AGNP-BC  [facility] [address] [place], [place] [address]  Phone [phone] Fax [phone] Note: This document was prepared with digital dictation and possible smart phrase technology. Any transcriptional errors that result from this process are unintentional.

## 2020-02-20 NOTE — Progress Notes (Signed)
I agree with the above plan 

## 2020-02-20 NOTE — Telephone Encounter (Signed)
[employer] medicare order sent to GI. No atuh they will reach out to the patient to schedule.

## 2020-02-22 ENCOUNTER — Ambulatory Visit: Payer: Medicare Other | Admitting: Physical Therapy

## 2020-02-22 ENCOUNTER — Encounter: Payer: Self-pay | Admitting: Occupational Therapy

## 2020-02-22 ENCOUNTER — Encounter: Payer: Self-pay | Admitting: Physical Therapy

## 2020-02-22 ENCOUNTER — Ambulatory Visit: Payer: Medicare Other | Admitting: Occupational Therapy

## 2020-02-22 ENCOUNTER — Other Ambulatory Visit: Payer: Self-pay

## 2020-02-22 DIAGNOSIS — R2689 Other abnormalities of gait and mobility: Secondary | ICD-10-CM

## 2020-02-22 DIAGNOSIS — R29818 Other symptoms and signs involving the nervous system: Secondary | ICD-10-CM

## 2020-02-22 DIAGNOSIS — R278 Other lack of coordination: Secondary | ICD-10-CM

## 2020-02-22 DIAGNOSIS — M6281 Muscle weakness (generalized): Secondary | ICD-10-CM

## 2020-02-22 DIAGNOSIS — I619 Nontraumatic intracerebral hemorrhage, unspecified: Secondary | ICD-10-CM

## 2020-02-22 NOTE — Patient Instructions (Signed)
1. Grip Strengthening (Resistive Putty)   Squeeze putty using thumb and all fingers. Repeat _20___ times. Do __2__ sessions per day.   2. Roll putty into tube on table and pinch between each finger and thumb x 10 reps each. (can do ring and small finger together)     Copyright  VHI. All rights reserved.   

## 2020-02-22 NOTE — Therapy (Signed)
[facility] [address] [place], [place], [address] Phone: [phone]   Fax:  [phone]  Occupational Therapy Treatment  Patient Details  Name: Katie Nunez MRN: [id number] Date of Birth: 1955/03/13 Referring Provider (OT): Molli Barrows   Encounter Date: 02/22/2020   OT End of Session - 02/22/20 0817    Visit Number 3    Number of Visits 17    Date for OT Re-Evaluation 04/09/20    Authorization Type [employer] Medicare    Authorization Time Period 90 day cert    Authorization - Visit Number 3    Authorization - Number of Visits 10    Progress Note Due on Visit 10    OT Start Time 0803    OT Stop Time 0843    OT Time Calculation (min) 40 min           Past Medical History:  Diagnosis Date  . Achalasia   . Anxiety   . Dysplasia of cervix, low grade (CIN 1)   . Environmental allergies    "all year long" (12/27/2016)  . ESRD (end stage renal disease) on dialysis [facility])    "TTS; [place]" (12/27/2016)  . Fibromyalgia   . GERD (gastroesophageal reflux disease)   . Gout   . Hypertension   . IBS (irritable bowel syndrome)   . MVP (mitral valve prolapse)   . RA (rheumatoid arthritis) (HCC)    FOLLOWED BY DR. SHANAHAN  . Raynaud's disease   . Scleroderma ([place])   . Seasonal allergies   . Thrombocytopenia ([facility] [place]) 07/01/2016   Acute fall to 13,000 07/01/16  . Tubular adenoma 01/08/2008   CECUM  . Vitamin D deficiency     Past Surgical History:  Procedure Laterality Date  . ANKLE FRACTURE SURGERY Right   . AV FISTULA PLACEMENT Left 06/28/2016   Procedure: left arm ARTERIOVENOUS (AV) FISTULA CREATION;  Surgeon: Rosetta Posner, MD;  Location: [place];  Service: Vascular;  Laterality: Left;  . BASCILIC VEIN TRANSPOSITION Left 09/27/2016   Procedure: LEFT UPPER ARM CEPHALIC VEIN TRANSPOSITION;  Surgeon: Rosetta Posner, MD;  Location: [facility];  Service: Vascular;  Laterality: Left;  . BREAST BIOPSY     "? side"  .  CESAREAN SECTION  1994  . CO2 LASER OF CERVIX    . COLONOSCOPY W/ BIOPSIES  01/08/2008  . INSERTION OF DIALYSIS CATHETER Right 06/28/2016   Procedure: INSERTION OF DIALYSIS CATHETER, right internal jugular;  Surgeon: Rosetta Posner, MD;  Location: [place];  Service: Vascular;  Laterality: Right;  . MYOMECTOMY    . NASAL ENDOSCOPY WITH EPISTAXIS CONTROL N/A 12/29/2019   Procedure: NASAL ENDOSCOPY WITH EPISTAXIS CONTROL;  Surgeon: Leta Baptist, MD;  [facility] OR;  Service: ENT;  Laterality: N/A;  . PELVIC LAPAROSCOPY  2011  . superficial thrombophlebitis Left 07-2014    There were no vitals filed for this visit.   Subjective Assessment - 02/22/20 0809    Subjective  Pt reports that she got her booster yesterday and her arm is painful    Pertinent History Scleroderma, RA,HTN, ESRD (dialysis), hx R ankle Fx, 2000, anxiety,fibromyalgia, gout, HTN, MVP, Raynaud's  disease, C section, ostructive lung disease, B CTS, hereditary and periferal neuropathy, venous reflux, ICH    Limitations NO BP LUE due to fistula    Patient Stated Goals write and use RUE better, drive agin    Currently in Pain? Yes    Pain Score 3  Pain Location Arm    Pain Orientation Right    Pain Descriptors / Indicators Aching    Pain Type Acute pain    Pain Onset 1 to 4 weeks ago    Pain Frequency Intermittent    Aggravating Factors  booster    Pain Relieving Factors rest                 Treatment: Placing large pegs into pegboard with RUE for increased fine motor coordination, min difficulty/ min v.c to hold head up for improved posture Standing to place and remove graded clothespins from antennae with RUE,min difficulty  min v.c and facilitation to weight bear through RLE               OT Education - 02/22/20 0820    Education Details Flipping playing cards with RUE for increased coordination, yellow putty for grip and pinch 10-20 reps each min v.c- see pt instructions.    Person(s) Educated  Patient    Comprehension Verbalized understanding;Returned demonstration;Verbal cues required            OT Short Term Goals - 02/13/20 1544      OT SHORT TERM GOAL #1   Title I with inital HEP.    Time 4    Period Weeks    Status New      OT SHORT TERM GOAL #2   Title Pt will demonstrate improved fine motor coordination for ADLs as evidenced by decreasing 9 hole peg test score for RUE to 52 secs or less    Baseline RUE60.56, LUE 32.81    Time 4    Period Weeks    Status New      OT SHORT TERM GOAL #3   Title Pt will perform light home management/simple cooking tasks with supervision and min v.c    Time 4    Period Weeks    Status New      OT SHORT TERM GOAL #4   Title Pt will demonstrate improved RUE functional use as eveidenced by increasing box/ blocks score to 25 blocks with RUE.    Time 4    Period Weeks    Status New      OT SHORT TERM GOAL #5   Title Pt will write a short paragraph with at least 90% legibility.    Time 4    Period Weeks    Status New      Additional Short Term Goals   Additional Short Term Goals Yes             OT Long Term Goals - 02/13/20 1550      OT LONG TERM GOAL #1   Title I with updated HEP.-04/09/20    Time 8    Period Weeks    Status New    Target Date 04/09/20      OT LONG TERM GOAL #2   Title Pt will perform basic home managment/ cooking modified Independently    Time 8    Period Weeks    Status New      OT LONG TERM GOAL #3   Title Pt will demonstrate improved fine motor coordination for ADLs as evidenced by decreasing 9 hole peg test score for RUE to 45 secs or less.    Time 8    Period Weeks    Status New      OT LONG TERM GOAL #4   Title Pt will write a short paragraph with 100% legibility.  Time 8    Period Weeks    Status New      OT LONG TERM GOAL #5   Title Pt will demonstrate ability to retrieve a lightweight object at 125 shoulder flexion with RUE demonstrating good control.    Time 8    Period  Weeks    Status New                 Plan - 02/22/20 1660    Clinical Impression Statement Pt is progressing  towards goals with improving RUE functional use.    OT Occupational Profile and History Detailed Assessment- Review of Records and additional review of physical, cognitive, psychosocial history related to current functional performance    Occupational performance deficits (Please refer to evaluation for details): ADL's;IADL's;Leisure;Rest and Sleep;Social Participation;Other    Body Structure / Function / Physical Skills ADL;Endurance;Decreased knowledge of precautions;Decreased knowledge of use of DME;Flexibility;IADL;Pain;Skin integrity;Edema;Mobility;ROM    Rehab Potential Good    Clinical Decision Making Limited treatment options, no task modification necessary    Comorbidities Affecting Occupational Performance: May have comorbidities impacting occupational performance    Comorbidities impacting occupational performance description: multiple inflamatory rheumatological conditions, kidney disease, vascular reflux,    Modification or Assistance to Complete Evaluation  No modification of tasks or assist necessary to complete eval    OT Frequency 2x / week    OT Duration 8 weeks    OT Treatment/Interventions Self-care/ADL training;Therapeutic exercise;DME and/or AE instruction;Therapist, nutritional;Compression bandaging;Other (comment);Manual Therapy;Scar mobilization;Coping strategies training;Energy conservation;Manual lymph drainage;Passive range of motion;Therapeutic activities    Plan RUE NMR, coordination, functional reach    Consulted and Agree with Plan of Care Patient           Patient will benefit from skilled therapeutic intervention in order to improve the following deficits and impairments:   Body Structure / Function / Physical Skills: ADL,Endurance,Decreased knowledge of precautions,Decreased knowledge of use of DME,Flexibility,IADL,Pain,Skin  integrity,Edema,Mobility,ROM       Visit Diagnosis: Muscle weakness (generalized)  Other lack of coordination  Other symptoms and signs involving the nervous system    Problem List Patient Active Problem List   Diagnosis Date Noted  . Protein-calorie malnutrition, severe 01/19/2020  . Epistaxis   . Sleep disturbance   . Slow transit constipation   . Hemorrhoids   . Chronic systolic congestive heart failure ([place])   . ESRD on dialysis ([place])   . Right hemiparesis ([place])   . Pressure injury of skin 12/20/2019  . Intraparenchymal hemorrhage of brain ([place]) 12/18/2019  . ICH (intracerebral hemorrhage) ([place]) 12/12/2019  . Viral disease 08/22/2019  . Chronic right-sided heart failure ([place]) 07/09/2019  . Supraventricular tachycardia ([place]) 07/09/2019  . Other cirrhosis of liver ([place]) 06/27/2019  . Heart failure ([place]) 02/21/2019  . Heart palpitations 08/14/2018  . Other fluid overload 07/27/2018  . Encounter for long-term (current) use of other medications 01/18/2018  . Hypothyroidism 01/18/2018  . Myalgia and myositis 01/18/2018  . Chronic nephritis 01/18/2018  . Other interstitial pulmonary diseases with fibrosis in diseases classified elsewhere ([place]) 01/18/2018  . Other long term (current) drug therapy 01/18/2018  . Sleep apnea 01/18/2018  . Unspecified persistent mental disorders due to conditions classified elsewhere 01/18/2018  . Venous reflux 01/18/2018  . Vitamin B12 deficiency 01/18/2018  . Vitamin D deficiency 01/18/2018  . Obstructive lung disease ([place]) 12/30/2017  . Other pruritus 12/27/2017  . Eruption cyst 12/19/2017  . 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 ([place]) 11/28/2017  . Chronic  cough 11/11/2017  . Pulmonary hypertension ([place]) 11/11/2017  . Pulmonary arterial hypertension ([place]) 10/07/2017  . Acute ITP ([place]) 01/05/2017  . ESRD (end stage renal disease) ([place]) 12/28/2016  . Encounter for removal of sutures 09/09/2016  .  Coagulation defect, unspecified ([place]) 09/01/2016  . Underimmunization status 08/05/2016  . Hemolytic anemia ([place]) 07/26/2016  . Epistaxis, recurrent 07/26/2016  . Unspecified protein-calorie malnutrition ([place]) 07/13/2016  . Aftercare including intermittent dialysis ([place]) 07/07/2016  . Anemia in chronic kidney disease 07/07/2016  . Hypokalemia 07/07/2016  . Iron deficiency anemia, unspecified 07/07/2016  . 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 Nonrheumatic mitral (valve) prolapse 07/07/2016  . Other irritable bowel syndrome 07/07/2016  . Other secondary thrombocytopenia 07/07/2016  . Secondary hyperparathyroidism of renal origin ([place]) 07/07/2016  . Thrombocytopenia ([place]) 07/01/2016  . ARF (acute renal failure) ([place]) 06/25/2016  . Anxiety 06/25/2016  . Bilateral carpal tunnel syndrome 10/22/2015  . Chronic gout without tophus 10/22/2015  . Chronic nonintractable headache 10/08/2015  . Fibroid uterus 01/03/2012  . H/O vitamin D deficiency 01/03/2012  . Post-menopausal 01/03/2012  . Hereditary and idiopathic peripheral neuropathy 11/19/2011  . Intestinal malabsorption 11/19/2011  . Lichen planus 91/06/8164  . Low back pain 11/19/2011  . Diffuse spasm of esophagus 11/11/2011  . ESR raised 11/11/2011  . Postinflammatory pulmonary fibrosis ([place]) 11/11/2011  . Scleroderma ([place]) 11/16/2010  . Rheumatoid arthritis ([place]) 11/16/2010  . Raynaud's disease 11/16/2010  . Symptomatic menopausal or female climacteric states 11/16/2010    Eward Rutigliano 02/22/2020, 8:22 AM Theone Murdoch, OTR/L [phone] Phone: [phone] 9:53 AM 02/22/20 [facility] [address] [place], [place], [address] Phone: [phone]   Fax:  [phone]  Name: Katie Nunez MRN: [id number] Date of Birth: [birth year]

## 2020-02-23 NOTE — Therapy (Signed)
[facility] [address] [place], [place], [address] Phone: [phone]   Fax:  [phone]  Physical Therapy Treatment  Patient Details  Name: Katie Nunez MRN: [id number] Date of Birth: 12-11-1955 Referring Provider (PT): Merri Brunette MD   Encounter Date: 02/22/2020     02/22/20 0852  PT Visits / Re-Eval  Visit Number 3  Number of Visits 12  Date for PT Re-Evaluation 04/13/20  PT Time Calculation  PT Start Time 0848  PT Stop Time 0930  PT Time Calculation (min) 42 min  PT - End of Session  Equipment Utilized During Treatment Gait belt  Activity Tolerance Patient tolerated treatment well  Behavior During Therapy [facility] for tasks assessed/performed    Past Medical History:  Diagnosis Date  . Achalasia   . Anxiety   . Dysplasia of cervix, low grade (CIN 1)   . Environmental allergies    "all year long" (12/27/2016)  . ESRD (end stage renal disease) on dialysis [facility])    "TTS; [place]" (12/27/2016)  . Fibromyalgia   . GERD (gastroesophageal reflux disease)   . Gout   . Hypertension   . IBS (irritable bowel syndrome)   . MVP (mitral valve prolapse)   . RA (rheumatoid arthritis) (HCC)    FOLLOWED BY DR. SHANAHAN  . Raynaud's disease   . Scleroderma (HCC)   . Seasonal allergies   . Thrombocytopenia (HCC) 07/01/2016   Acute fall to 13,000 07/01/16  . Tubular adenoma 01/08/2008   CECUM  . Vitamin D deficiency     Past Surgical History:  Procedure Laterality Date  . ANKLE FRACTURE SURGERY Right   . AV FISTULA PLACEMENT Left 06/28/2016   Procedure: left arm ARTERIOVENOUS (AV) FISTULA CREATION;  Surgeon: Larina Earthly, MD;  Location: [facility] OR;  Service: Vascular;  Laterality: Left;  . BASCILIC VEIN TRANSPOSITION Left 09/27/2016   Procedure: LEFT UPPER ARM CEPHALIC VEIN TRANSPOSITION;  Surgeon: Larina Earthly, MD;  Location: [facility] OR;  Service: Vascular;  Laterality: Left;  . BREAST BIOPSY     "? side"  .  CESAREAN SECTION  1994  . CO2 LASER OF CERVIX    . COLONOSCOPY W/ BIOPSIES  01/08/2008  . INSERTION OF DIALYSIS CATHETER Right 06/28/2016   Procedure: INSERTION OF DIALYSIS CATHETER, right internal jugular;  Surgeon: Larina Earthly, MD;  Location: [facility] OR;  Service: Vascular;  Laterality: Right;  . MYOMECTOMY    . NASAL ENDOSCOPY WITH EPISTAXIS CONTROL N/A 12/29/2019   Procedure: NASAL ENDOSCOPY WITH EPISTAXIS CONTROL;  Surgeon: Newman Pies, MD;  Location: [facility] OR;  Service: ENT;  Laterality: N/A;  . PELVIC LAPAROSCOPY  2011  . superficial thrombophlebitis Left 07-2014    There were no vitals filed for this visit.     02/22/20 0850  Symptoms/Limitations  Subjective No new complaints. Does have some right UE soreness from booster shot, this is also her weaker arm. Reports no falls.  Pertinent History Scleroderma, RA, pulmonary HTN, kidney disease (dialysis) Tu,TH and Sat, Rt ankle fx/dislocation/surgery 2000  Patient Stated Goals strengthen ankle and knee R, get off of cane  Pain Assessment  Currently in Pain? Yes  Pain Score 4  Pain Location Arm  Pain Orientation Right  Pain Descriptors / Indicators Aching;Sore  Pain Type Acute pain  Pain Frequency Intermittent  Aggravating Factors  with certain movemement such as reaching/stretching arm out  Pain Relieving Factors rest, tylenol      02/22/20 0853  Transfers  Transfers Sit  to Stand;Stand to Sit  Sit to Stand 4: Min guard;With upper extremity assist;From bed;From chair/3-in-1  Stand to Sit 4: Min guard;With upper extremity assist;To bed;To chair/3-in-1  Ambulation/Gait  Ambulation/Gait Yes  Ambulation/Gait Assistance 4: Min guard  Ambulation/Gait Assistance Details pt noted to drag right foot along with swing phase. discussed if pt has a brace. Pt reports CIR tried a few braces with none of them being comfortable or working, so they deferred it to later. Trialed anterior [place] brace with medial strut with pt reporting strut digging  into foot/ankle. Next trialed [place] anterior brace with more comfort noted with mild improvement noted in gait stability. Pt most likley will benefit from orthotic consult for best bracing options. Will discuss with primary PT. Pt in agreement with use of brace  Ambulation Distance (Feet) 80 Feet (x1, 230 x1, plus around gym with session)  Assistive device Straight cane  Gait Pattern Step-to pattern;Decreased arm swing - right;Decreased step length - left;Decreased stance time - right;Decreased stride length;Decreased hip/knee flexion - right;Decreased dorsiflexion - right;Decreased weight shift to right;Trunk flexed;Narrow base of support  Ambulation Surface Level;Indoor  Knee/Hip Exercises: Aerobic  Nustep Level 3.0 with UE/LE's x 5 minutes with goal >/= 55 steps per minute with cues needed for tall posture.          PT Short Term Goals - 02/13/20 1517      PT SHORT TERM GOAL #1   Title Ind with initial HEP and able to demo back to PT w/o need of VCs    Baseline no HEP    Time 2    Period Weeks    Status New    Target Date 02/27/20      PT SHORT TERM GOAL #2   Title Patient to obtain [facility] and bring it to PT sessions for adjustment and training    Baseline not using cane prior    Time 2    Period Weeks    Status New    Target Date 02/27/20             PT Long Term Goals - 02/13/20 1520      PT LONG TERM GOAL #1   Title Patient to improve 5x STS score to < 20 sec    Baseline 24 seconds    Time 8    Period Weeks    Status New    Target Date 04/09/20      PT LONG TERM GOAL #2   Title Patient to demo 4+/5 R ankle/knee strength throughout    Baseline 4/5 R ankle knee strength    Time 8    Period Weeks    Status New    Target Date 04/09/20      PT LONG TERM GOAL #3   Title Patient to decerease TUG time to ,13.5 sec w/o AD    Baseline 18 sec TUG average of 2 trials    Time 8    Period Weeks    Status New    Target Date 04/09/20      PT LONG TERM GOAL #4    Title Ambulate 1023ft with LRAD across outdoor surfaces under distant S    Baseline limited to in home ambulation    Time 8    Period Weeks    Status New    Target Date 04/09/20      PT LONG TERM GOAL #5   Title Return to driving as appropriate    Baseline currently not driving  Time 8    Period Weeks    Status New    Target Date 04/09/20              02/22/20 7829  Plan  Clinical Impression Statement Pt noted to have decreased rigth foot clearance and LE stability with gait. Trialed use of clinic braces with one brace improving gait mechanics some, pt most likely will benefit from a consult with an orthotist to determine best brace option. Will discuss with primary PT and have this included in plan of care if agreed to. Pt also expressed desire to stay 2x a week throughout all 8 weeks in the cert , advised her to talk with the primary PT at her next appt. The pt is progressing and should benefit from continued PT to progress toward unmet goals.  Personal Factors and Comorbidities Age;Comorbidity 2  Comorbidities ESRD(HD T, Th, Sat), R ankle fx 2000  Examination-Participation Restrictions Community Activity  Pt will benefit from skilled therapeutic intervention in order to improve on the following deficits Postural dysfunction;Decreased balance;Impaired flexibility;Abnormal gait;Decreased mobility;Difficulty walking;Decreased coordination  Rehab Potential Good  PT Frequency 2x / week  PT Duration 4 weeks  PT Treatment/Interventions ADLs/Self Care Home Management;Cryotherapy;Electrical Stimulation;Ultrasound;Therapeutic exercise;Balance training;Patient/family education;Manual techniques;Dry needling;Therapeutic activities;Neuromuscular re-education;DME Instruction;Gait training;Stair training;Functional mobility training  PT Next Visit Plan Continue with gait training, progress to RLE strength exercises focused on ankle and knee, review ambulation with cane and initiate balance  training. ? set up an orthotic consult.  PT Home Exercise Plan heel/toe raises, standing marching  Consulted and Agree with Plan of Care Patient         Patient will benefit from skilled therapeutic intervention in order to improve the following deficits and impairments:  Postural dysfunction,Decreased balance,Impaired flexibility,Abnormal gait,Decreased mobility,Difficulty walking,Decreased coordination  Visit Diagnosis: Other abnormalities of gait and mobility  Intraparenchymal hemorrhage of brain [facility])     Problem List Patient Active Problem List   Diagnosis Date Noted  . Protein-calorie malnutrition, severe 01/19/2020  . Epistaxis   . Sleep disturbance   . Slow transit constipation   . Hemorrhoids   . Chronic systolic congestive heart failure (HCC)   . ESRD on dialysis (HCC)   . Right hemiparesis (HCC)   . Pressure injury of skin 12/20/2019  . Intraparenchymal hemorrhage of brain (HCC) 12/18/2019  . ICH (intracerebral hemorrhage) (HCC) 12/12/2019  . Viral disease 08/22/2019  . Chronic right-sided heart failure (HCC) 07/09/2019  . Supraventricular tachycardia (HCC) 07/09/2019  . Other cirrhosis of liver (HCC) 06/27/2019  . Heart failure (HCC) 02/21/2019  . Heart palpitations 08/14/2018  . Other fluid overload 07/27/2018  . Encounter for long-term (current) use of other medications 01/18/2018  . Hypothyroidism 01/18/2018  . Myalgia and myositis 01/18/2018  . Chronic nephritis 01/18/2018  . Other interstitial pulmonary diseases with fibrosis in diseases classified elsewhere (HCC) 01/18/2018  . Other long term (current) drug therapy 01/18/2018  . Sleep apnea 01/18/2018  . Unspecified persistent mental disorders due to conditions classified elsewhere 01/18/2018  . Venous reflux 01/18/2018  . Vitamin B12 deficiency 01/18/2018  . Vitamin D deficiency 01/18/2018  . Obstructive lung disease (HCC) 12/30/2017  . Other pruritus 12/27/2017  . Eruption cyst 12/19/2017  .  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 (HCC) 11/28/2017  . Chronic cough 11/11/2017  . Pulmonary hypertension (HCC) 11/11/2017  . Pulmonary arterial hypertension (HCC) 10/07/2017  . Acute ITP (HCC) 01/05/2017  . ESRD (end stage renal disease) (HCC) 12/28/2016  . Encounter for removal  of sutures 09/09/2016  . Coagulation defect, unspecified (HCC) 09/01/2016  . Underimmunization status 08/05/2016  . Hemolytic anemia (HCC) 07/26/2016  . Epistaxis, recurrent 07/26/2016  . Unspecified protein-calorie malnutrition (HCC) 07/13/2016  . Aftercare including intermittent dialysis (HCC) 07/07/2016  . Anemia in chronic kidney disease 07/07/2016  . Hypokalemia 07/07/2016  . Iron deficiency anemia, unspecified 07/07/2016  . 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 Nonrheumatic mitral (valve) prolapse 07/07/2016  . Other irritable bowel syndrome 07/07/2016  . Other secondary thrombocytopenia 07/07/2016  . Secondary hyperparathyroidism of renal origin (HCC) 07/07/2016  . Thrombocytopenia (HCC) 07/01/2016  . ARF (acute renal failure) (HCC) 06/25/2016  . Anxiety 06/25/2016  . Bilateral carpal tunnel syndrome 10/22/2015  . Chronic gout without tophus 10/22/2015  . Chronic nonintractable headache 10/08/2015  . Fibroid uterus 01/03/2012  . H/O vitamin D deficiency 01/03/2012  . Post-menopausal 01/03/2012  . Hereditary and idiopathic peripheral neuropathy 11/19/2011  . Intestinal malabsorption 11/19/2011  . Lichen planus 11/19/2011  . Low back pain 11/19/2011  . Diffuse spasm of esophagus 11/11/2011  . ESR raised 11/11/2011  . Postinflammatory pulmonary fibrosis (HCC) 11/11/2011  . Scleroderma (HCC) 11/16/2010  . Rheumatoid arthritis (HCC) 11/16/2010  . Raynaud's disease 11/16/2010  . Symptomatic menopausal or female climacteric states 11/16/2010   Sallyanne Kuster, PTA, [facility] [address], [address] [place], [place]  [address] [phone] 02/23/20, 7:04 PM   Name: Katie Nunez MRN: [id number] Date of Birth: Nov 23, 1955

## 2020-02-25 ENCOUNTER — Encounter: Payer: Self-pay | Admitting: Physical Medicine & Rehabilitation

## 2020-02-25 ENCOUNTER — Other Ambulatory Visit: Payer: Self-pay

## 2020-02-25 ENCOUNTER — Encounter: Payer: Medicare Other | Attending: Physical Medicine & Rehabilitation | Admitting: Physical Medicine & Rehabilitation

## 2020-02-25 VITALS — BP 154/82 | Temp 97.9°F | Ht 65.0 in | Wt 119.0 lb

## 2020-02-25 DIAGNOSIS — I619 Nontraumatic intracerebral hemorrhage, unspecified: Secondary | ICD-10-CM | POA: Diagnosis present

## 2020-02-25 DIAGNOSIS — R269 Unspecified abnormalities of gait and mobility: Secondary | ICD-10-CM | POA: Insufficient documentation

## 2020-02-25 DIAGNOSIS — K5901 Slow transit constipation: Secondary | ICD-10-CM | POA: Insufficient documentation

## 2020-02-25 DIAGNOSIS — G8191 Hemiplegia, unspecified affecting right dominant side: Secondary | ICD-10-CM | POA: Diagnosis not present

## 2020-02-25 NOTE — Progress Notes (Signed)
Subjective:    Patient ID: Katie Nunez, female    DOB: 1955-12-16, 65 y.o.   MRN: [id number]  HPI RH-female with history of HTN, ESRD-TTS, Raynaud's, achalasia, cirrhosis of liver, PAH-oxygen dependent, A. fib with multiple recent hospitalizations for C. difficile colitis, portal vein thrombosis as well as LLE abscess requiring drainage presents for follow up for left frontal ICH.  Family supplements history. At discharge, she was instructed to follow up with Neuro, imagining has been ordered. She followed up with PCP. She saw ENT. She no longer needs to follow up with Ortho. Denies falls. Pain is intermittent.  Her O2 requirement is stable, not using during office visit. Weights have been relatively stable. She saw GI for constipation. Denies falls.  Therapies: 2/week outpatient Mobility: Cane for most of the time DME: Bedside commode  Pain Inventory Average Pain 6 Pain Right Now 6 My pain is intermittent, sharp, burning, stabbing, tingling and aching  In the last 24 hours, has pain interfered with the following? General activity 4 Relation with others 4 Enjoyment of life 4 What TIME of day is your pain at its worst? night Sleep (in general) Poor  Pain is worse with: some activites Pain improves with: heat/ice, therapy/exercise and medication Relief from Meds: 8  Family History  Problem Relation Age of Onset  . Hypertension Mother   . Diabetes Mother   . Heart disease Father   . Hypertension Maternal Aunt   . Diabetes Maternal Grandmother   . Heart disease Paternal Grandfather   . Cerebral palsy Cousin        1ST COUSIN?  . Diabetes Paternal Grandmother    Social History   Socioeconomic History  . Marital status: Married    Spouse name: Not on file  . Number of children: Not on file  . Years of education: Not on file  . Highest education level: Not on file  Occupational History  . Not on file  Tobacco Use  . Smoking status: Never Smoker  . Smokeless  tobacco: Never Used  Vaping Use  . Vaping Use: Never used  Substance and Sexual Activity  . Alcohol use: No  . Drug use: No  . Sexual activity: Not Currently    Birth control/protection: Post-menopausal  Other Topics Concern  . Not on file  Social History Narrative   Lives in [place] Determinants of Health   Financial Resource Strain: Not on file  Food Insecurity: Not on file  Transportation Needs: Not on file  Physical Activity: Not on file  Stress: Not on file  Social Connections: Not on file   Past Surgical History:  Procedure Laterality Date  . ANKLE FRACTURE SURGERY Right   . AV FISTULA PLACEMENT Left 06/28/2016   Procedure: left arm ARTERIOVENOUS (AV) FISTULA CREATION;  Surgeon: Rosetta Posner, MD;  Location: [facility];  Service: Vascular;  Laterality: Left;  . BASCILIC VEIN TRANSPOSITION Left 09/27/2016   Procedure: LEFT UPPER ARM CEPHALIC VEIN TRANSPOSITION;  Surgeon: Rosetta Posner, MD;  Location: [facility];  Service: Vascular;  Laterality: Left;  . BREAST BIOPSY     "? side"  . CESAREAN SECTION  1994  . CO2 LASER OF CERVIX    . COLONOSCOPY W/ BIOPSIES  01/08/2008  . INSERTION OF DIALYSIS CATHETER Right 06/28/2016   Procedure: INSERTION OF DIALYSIS CATHETER, right internal jugular;  Surgeon: Rosetta Posner, MD;  Location: [facility];  Service: Vascular;  Laterality: Right;  . MYOMECTOMY    .  NASAL ENDOSCOPY WITH EPISTAXIS CONTROL N/A 12/29/2019   Procedure: NASAL ENDOSCOPY WITH EPISTAXIS CONTROL;  Surgeon: Leta Baptist, MD;  Location: [facility];  Service: ENT;  Laterality: N/A;  . PELVIC LAPAROSCOPY  2011  . superficial thrombophlebitis Left 07-2014   Past Surgical History:  Procedure Laterality Date  . ANKLE FRACTURE SURGERY Right   . AV FISTULA PLACEMENT Left 06/28/2016   Procedure: left arm ARTERIOVENOUS (AV) FISTULA CREATION;  Surgeon: Rosetta Posner, MD;  Location: [facility];  Service: Vascular;  Laterality: Left;  . BASCILIC VEIN TRANSPOSITION Left 09/27/2016   Procedure:  LEFT UPPER ARM CEPHALIC VEIN TRANSPOSITION;  Surgeon: Rosetta Posner, MD;  Location: [facility];  Service: Vascular;  Laterality: Left;  . BREAST BIOPSY     "? side"  . CESAREAN SECTION  1994  . CO2 LASER OF CERVIX    . COLONOSCOPY W/ BIOPSIES  01/08/2008  . INSERTION OF DIALYSIS CATHETER Right 06/28/2016   Procedure: INSERTION OF DIALYSIS CATHETER, right internal jugular;  Surgeon: Rosetta Posner, MD;  Location: [facility];  Service: Vascular;  Laterality: Right;  . MYOMECTOMY    . NASAL ENDOSCOPY WITH EPISTAXIS CONTROL N/A 12/29/2019   Procedure: NASAL ENDOSCOPY WITH EPISTAXIS CONTROL;  Surgeon: Leta Baptist, MD;  Location: [facility];  Service: ENT;  Laterality: N/A;  . PELVIC LAPAROSCOPY  2011  . superficial thrombophlebitis Left 07-2014   Past Medical History:  Diagnosis Date  . Achalasia   . Anxiety   . Dysplasia of cervix, low grade (CIN 1)   . Environmental allergies    "all year long" (12/27/2016)  . ESRD (end stage renal disease) on dialysis [facility])    "TTS; [place]" (12/27/2016)  . Fibromyalgia   . GERD (gastroesophageal reflux disease)   . Gout   . Hypertension   . IBS (irritable bowel syndrome)   . MVP (mitral valve prolapse)   . RA (rheumatoid arthritis) (HCC)    FOLLOWED BY DR. SHANAHAN  . Raynaud's disease   . Scleroderma ([place])   . Seasonal allergies   . Thrombocytopenia ([place]) 07/01/2016   Acute fall to 13,000 07/01/16  . Tubular adenoma 01/08/2008   CECUM  . Vitamin D deficiency    BP (!) 154/82   Temp 97.9 F (36.6 C)   Ht 5\' 5"  (1.651 m)   Wt 119 lb (54 kg)   LMP 11/15/2008   BMI 19.80 kg/m   Opioid Risk Score:   Fall Risk Score:  `1  Depression screen PHQ 2/9  Depression screen [facility] 2/9 02/05/2020 01/31/2018 09/26/2017 05/30/2017  Decreased Interest 0 0 0 0  Down, Depressed, Hopeless 0 0 0 0  PHQ - 2 Score 0 0 0 0  Altered sleeping 1 - - -  Tired, decreased energy 1 - - -  Change in appetite 0 - - -  Feeling bad or failure about yourself  0 - - -  Trouble  concentrating 0 - - -  Moving slowly or fidgety/restless 0 - - -  Suicidal thoughts 0 - - -  PHQ-9 Score 2 - - -  Some recent data might be hidden    Review of Systems  Constitutional: Negative.   HENT: Negative.   Eyes: Negative.   Respiratory: Negative.   Cardiovascular: Negative.   Gastrointestinal: Negative.   Endocrine: Negative.   Genitourinary: Negative.        Dialysis   Musculoskeletal: Positive for arthralgias and gait problem.  Skin: Negative.   Allergic/Immunologic: Negative.   Hematological:  Negative.   Psychiatric/Behavioral: Negative.   All other systems reviewed and are negative.      Objective:   Physical Exam  Constitutional: No distress . Vital signs reviewed. HENT: Normocephalic.  Atraumatic. Eyes: EOMI. No discharge. Cardiovascular: No JVD.   Respiratory: Normal effort.  No stridor.   GI: Non-distended.   Skin: Warm and dry.  Intact. Psych: Normal mood.  Normal behavior. Musc: No edema in extremities.  No tenderness in extremities. Neuro: Alert Motor:  RUE: Shoulder abduction, elbow flexion/extension 4+/5, wrist extension 4/5, handgrip 4+/5  Right lower extremity: 4+/5 hip flexion, knee extension,   4+/5 ankle dorsiflexion     Assessment & Plan:  RH-female with history of HTN, ESRD-TTS, Raynaud's, achalasia, cirrhosis of liver, PAH-oxygen dependent, A. fib with multiple recent hospitalizations for C. difficile colitis, portal vein thrombosis as well as LLE abscess requiring drainage presents for follow up for left frontal ICH.  1.  L frontal ICH secondary to recent starting of Coumadin- Coumadin on hold with R hemiparesis  Cont therapies  Continue follow up with Neuro  2.  Portal vein thrombosis/A fib/Antithrombotics: coumadin per Neuro  3. Pain Management:   Cont tylenol prn  Cont Gabapentin  Wean tramadol   4.  ESRD:   Cont follow up with Nephro  5. Pulmonary HTN:   Cont home O2 as needed  6. Cirrhosis of the liver:   Limit  tylenol, taking ~2/week  7. Constipation   Follow up with GI  8. Significant epistaxis (pt has prior history)  Cont follow up with ENT  9. Gait abnormality  Cont cane for safety  Cont therapies  Meds reviewed Referral reviewed All questions answered

## 2020-02-26 ENCOUNTER — Telehealth: Payer: Self-pay

## 2020-02-26 NOTE — Telephone Encounter (Signed)
Dr. Posey Pronto, Markiyah Gahm is being treated by physical therapy for R hemiparesis.  Ms. Hinostroza will benefit from use of R AFO in order to improve safety with functional mobility.    If you agree, please submit request in EPIC under MD Order, Other Orders (list R AFO in comments) or fax to [facility] at [phone].   Thank you, Leroy Sea Botkins [address] [place],   [address] Phone:  [phone] Fax:  [phone]

## 2020-02-27 ENCOUNTER — Ambulatory Visit: Payer: Medicare Other | Admitting: Occupational Therapy

## 2020-02-27 ENCOUNTER — Ambulatory Visit: Payer: Medicare Other

## 2020-02-27 ENCOUNTER — Other Ambulatory Visit: Payer: Self-pay

## 2020-02-27 ENCOUNTER — Encounter: Payer: Self-pay | Admitting: Occupational Therapy

## 2020-02-27 DIAGNOSIS — I619 Nontraumatic intracerebral hemorrhage, unspecified: Secondary | ICD-10-CM | POA: Diagnosis not present

## 2020-02-27 DIAGNOSIS — R29818 Other symptoms and signs involving the nervous system: Secondary | ICD-10-CM

## 2020-02-27 DIAGNOSIS — M6281 Muscle weakness (generalized): Secondary | ICD-10-CM

## 2020-02-27 DIAGNOSIS — R2681 Unsteadiness on feet: Secondary | ICD-10-CM

## 2020-02-27 DIAGNOSIS — R2689 Other abnormalities of gait and mobility: Secondary | ICD-10-CM

## 2020-02-27 DIAGNOSIS — R278 Other lack of coordination: Secondary | ICD-10-CM

## 2020-02-27 NOTE — Therapy (Signed)
[facility] [address] [place], [place], [address] Phone: [phone]   Fax:  [phone]  Physical Therapy Treatment  Patient Details  Name: Katie Nunez MRN: [id number] Date of Birth: [birth year] Referring Provider (PT): [name] MD   Encounter Date: 02/27/2020   PT End of Session - 02/27/20 1812    Visit Number 4    Number of Visits 12    Date for PT Re-Evaluation 04/13/20    Authorization Type [employer]    PT Start Time 1720    PT Stop Time 1800    PT Time Calculation (min) 40 min    Equipment Utilized During Treatment Gait belt    Activity Tolerance Patient tolerated treatment well    Behavior During Therapy [facility] for tasks assessed/performed           Past Medical History:  Diagnosis Date  . Achalasia   . Anxiety   . Dysplasia of cervix, low grade (CIN 1)   . Environmental allergies    "all year long" (12/27/2016)  . ESRD (end stage renal disease) on dialysis [facility])    "TTS; [place]" (12/27/2016)  . Fibromyalgia   . GERD (gastroesophageal reflux disease)   . Gout   . Hypertension   . IBS (irritable bowel syndrome)   . MVP (mitral valve prolapse)   . RA (rheumatoid arthritis) (HCC)    FOLLOWED BY DR. SHANAHAN  . Raynaud's disease   . Scleroderma ([place])   . Seasonal allergies   . Thrombocytopenia ([place]) 07/01/2016   Acute fall to 13,000 07/01/16  . Tubular adenoma 01/08/2008   CECUM  . Vitamin D deficiency     Past Surgical History:  Procedure Laterality Date  . ANKLE FRACTURE SURGERY Right   . AV FISTULA PLACEMENT Left 06/28/2016   Procedure: left arm ARTERIOVENOUS (AV) FISTULA CREATION;  Surgeon: Rosetta Posner, MD;  Location: [facility];  Service: Vascular;  Laterality: Left;  . BASCILIC VEIN TRANSPOSITION Left 09/27/2016   Procedure: LEFT UPPER ARM CEPHALIC VEIN TRANSPOSITION;  Surgeon: Rosetta Posner, MD;  Location: [facility];  Service: Vascular;  Laterality: Left;  . BREAST BIOPSY     "?  side"  . CESAREAN SECTION  1994  . CO2 LASER OF CERVIX    . COLONOSCOPY W/ BIOPSIES  01/08/2008  . INSERTION OF DIALYSIS CATHETER Right 06/28/2016   Procedure: INSERTION OF DIALYSIS CATHETER, right internal jugular;  Surgeon: Rosetta Posner, MD;  Location: [facility];  Service: Vascular;  Laterality: Right;  . MYOMECTOMY    . NASAL ENDOSCOPY WITH EPISTAXIS CONTROL N/A 12/29/2019   Procedure: NASAL ENDOSCOPY WITH EPISTAXIS CONTROL;  Surgeon: Leta Baptist, MD;  Location: [facility] OR;  Service: ENT;  Laterality: N/A;  . PELVIC LAPAROSCOPY  2011  . superficial thrombophlebitis Left 07-2014    There were no vitals filed for this visit.   Subjective Assessment - 02/27/20 1810    Subjective no pain, no falls no med changes or MD visits, encountered traffic and was 20 late for session.    Patient is accompained by: Family member    Pertinent History Scleroderma, RA, pulmonary HTN, kidney disease (dialysis) Tu,TH and Sat, Rt ankle fx/dislocation/surgery 2000    Patient Stated Goals strengthen ankle and knee R, get off of cane    Currently in Pain? No/denies  [facility] Adult PT Treatment/Exercise - 02/27/20 0001      Transfers   Transfers Sit to Stand;Stand to Sit    Stand to Sit 4: Min guard    Comments performed from airex 2x10, UE assist to rise , arms crossed to sit      Ambulation/Gait   Ambulation/Gait Yes    Ambulation/Gait Assistance 5: Supervision    Ambulation/Gait Assistance Details performed with AFO L and SPC    Ambulation Distance (Feet) 230 Feet    Assistive device Straight cane    Gait Pattern Step-through pattern    Ambulation Surface Level;Indoor      Knee/Hip Exercises: Aerobic   Nustep L3 x 6 min      Knee/Hip Exercises: Seated   Long Arc Quad Strengthening;Both;2 sets;15 reps    Long Arc Quad Limitations performed with ball squeeze to focus on VMO facilitation               Balance Exercises - 02/27/20 0001      Balance  Exercises: Standing   Standing Eyes Opened Narrow base of support (BOS);Foam/compliant surface;30 secs;2 reps    Standing Eyes Opened Limitations no UE support    Standing Eyes Closed 1 rep;30 secs;Foam/compliant surface;2 reps    Standing Eyes Closed Limitations intermittent UEsupport wih EC    SLS with Vectors Solid surface;Upper extremity assist 1    SLS with Vectors Limitations cone taps 10x per leg with 2 cones, then 10 taps to single cone    Rockerboard Anterior/posterior;Lateral;EO;EC;30 seconds    Rockerboard Limitations 30s each position with intermittent UE support               PT Short Term Goals - 02/13/20 1517      PT SHORT TERM GOAL #1   Title Ind with initial HEP and able to demo back to PT w/o need of VCs    Baseline no HEP    Time 2    Period Weeks    Status New    Target Date 02/27/20      PT SHORT TERM GOAL #2   Title Patient to obtain [facility] and bring it to PT sessions for adjustment and training    Baseline not using cane prior    Time 2    Period Weeks    Status New    Target Date 02/27/20             PT Long Term Goals - 02/13/20 1520      PT LONG TERM GOAL #1   Title Patient to improve 5x STS score to < 20 sec    Baseline 24 seconds    Time 8    Period Weeks    Status New    Target Date 04/09/20      PT LONG TERM GOAL #2   Title Patient to demo 4+/5 R ankle/knee strength throughout    Baseline 4/5 R ankle knee strength    Time 8    Period Weeks    Status New    Target Date 04/09/20      PT LONG TERM GOAL #3   Title Patient to decerease TUG time to ,13.5 sec w/o AD    Baseline 18 sec TUG average of 2 trials    Time 8    Period Weeks    Status New    Target Date 04/09/20      PT LONG TERM GOAL #4   Title Ambulate 1075f with LRAD across outdoor  surfaces under distant S    Baseline limited to in home ambulation    Time 8    Period Weeks    Status New    Target Date 04/09/20      PT LONG TERM GOAL #5   Title Return to  driving as appropriate    Baseline currently not driving    Time 8    Period Weeks    Status New    Target Date 04/09/20                 Plan - 02/27/20 1813    Clinical Impression Statement Continues to demo foot drop and circumduction on R, improved with OTC AFO and ambulation trials in clinic, focused on LE strength issues as well as static balance EO/EC and SLS with vectors(cone taps)    Personal Factors and Comorbidities Age;Comorbidity 2    Comorbidities ESRD(HD T, Th, Sat), R ankle fx 2000    Examination-Participation Restrictions Community Activity    Rehab Potential Good    PT Frequency 2x / week    PT Duration 4 weeks    PT Treatment/Interventions ADLs/Self Care Home Management;Cryotherapy;Electrical Stimulation;Ultrasound;Therapeutic exercise;Balance training;Patient/family education;Manual techniques;Dry needling;Therapeutic activities;Neuromuscular re-education;DME Instruction;Gait training;Stair training;Functional mobility training    PT Next Visit Plan continue LE strengthening and balnce, add stepping tasks and SLS activities to challenge RLE, t-band around knees(?)    PT Home Exercise Plan heel/toe raises, standing marching    Consulted and Agree with Plan of Care Patient           Patient will benefit from skilled therapeutic intervention in order to improve the following deficits and impairments:  Postural dysfunction,Decreased balance,Impaired flexibility,Abnormal gait,Decreased mobility,Difficulty walking,Decreased coordination  Visit Diagnosis: Unsteadiness on feet  Muscle weakness (generalized)     Problem List Patient Active Problem List   Diagnosis Date Noted  . Abnormality of gait 02/25/2020  . Protein-calorie malnutrition, severe 01/19/2020  . Epistaxis   . Sleep disturbance   . Slow transit constipation   . Hemorrhoids   . Chronic systolic congestive heart failure ([place])   . ESRD on dialysis ([place])   . Right hemiparesis ([place])   .  Pressure injury of skin 12/20/2019  . Intraparenchymal hemorrhage of brain ([place]) 12/18/2019  . ICH (intracerebral hemorrhage) ([place]) 12/12/2019  . Viral disease 08/22/2019  . Chronic right-sided heart failure ([place]) 07/09/2019  . Supraventricular tachycardia ([place]) 07/09/2019  . Other cirrhosis of liver ([place]) 06/27/2019  . Heart failure ([place]) 02/21/2019  . Heart palpitations 08/14/2018  . Other fluid overload 07/27/2018  . Encounter for long-term (current) use of other medications 01/18/2018  . Hypothyroidism 01/18/2018  . Myalgia and myositis 01/18/2018  . Chronic nephritis 01/18/2018  . Other interstitial pulmonary diseases with fibrosis in diseases classified elsewhere ([place]) 01/18/2018  . Other long term (current) drug therapy 01/18/2018  . Sleep apnea 01/18/2018  . Unspecified persistent mental disorders due to conditions classified elsewhere 01/18/2018  . Venous reflux 01/18/2018  . Vitamin B12 deficiency 01/18/2018  . Vitamin D deficiency 01/18/2018  . Obstructive lung disease ([place]) 12/30/2017  . Other pruritus 12/27/2017  . Eruption cyst 12/19/2017  . 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 Chronic cough 11/11/2017  . Pulmonary hypertension ([place]) 11/11/2017  . Pulmonary arterial hypertension ([place]) 10/07/2017  . Acute ITP ([place]) 01/05/2017  . ESRD (end stage renal disease) ([place]) 12/28/2016  . Encounter for removal of sutures 09/09/2016  . Coagulation defect, unspecified ([place]) 09/01/2016  .  Underimmunization status 08/05/2016  . Hemolytic anemia ([place]) 07/26/2016  . Epistaxis, recurrent 07/26/2016  . Unspecified protein-calorie malnutrition ([place]) 07/13/2016  . Aftercare including intermittent dialysis ([place]) 07/07/2016  . Anemia in chronic kidney disease 07/07/2016  . Hypokalemia 07/07/2016  . Iron deficiency anemia, unspecified 07/07/2016  . 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 Nonrheumatic mitral (valve) prolapse 07/07/2016  .  Other irritable bowel syndrome 07/07/2016  . Other secondary thrombocytopenia 07/07/2016  . Secondary hyperparathyroidism of renal origin ([place]) 07/07/2016  . Thrombocytopenia ([place]) 07/01/2016  . ARF (acute renal failure) ([place]) 06/25/2016  . Anxiety 06/25/2016  . Bilateral carpal tunnel syndrome 10/22/2015  . Chronic gout without tophus 10/22/2015  . Chronic nonintractable headache 10/08/2015  . Fibroid uterus 01/03/2012  . H/O vitamin D deficiency 01/03/2012  . Post-menopausal 01/03/2012  . Hereditary and idiopathic peripheral neuropathy 11/19/2011  . Intestinal malabsorption 11/19/2011  . Lichen planus 38/75/6433  . Low back pain 11/19/2011  . Diffuse spasm of esophagus 11/11/2011  . ESR raised 11/11/2011  . Postinflammatory pulmonary fibrosis ([place]) 11/11/2011  . Scleroderma ([place]) 11/16/2010  . Rheumatoid arthritis ([place]) 11/16/2010  . Raynaud's disease 11/16/2010  . Symptomatic menopausal or female climacteric states 11/16/2010    Lanice Shirts 02/27/2020, 6:22 PM  [facility] [address] [place], [place], [address] Phone: [phone]   Fax:  [phone]  Name: Katie Nunez MRN: [id number] Date of Birth: Jun 22, 1955

## 2020-02-27 NOTE — Addendum Note (Signed)
Addended by: Delice Lesch A on: 02/27/2020 08:26 AM   Modules accepted: Orders

## 2020-02-27 NOTE — Therapy (Signed)
[facility] [address] [place], [place], [address] Phone: [phone]   Fax:  [phone]  Occupational Therapy Treatment  Patient Details  Name: Katie Nunez MRN: [id number] Date of Birth: Sep 03, 1955 Referring Provider (OT): Molli Barrows   Encounter Date: 02/27/2020   OT End of Session - 02/27/20 1800    Visit Number 4    Number of Visits 17    Date for OT Re-Evaluation 04/09/20    Authorization Type UHC Medicare    Authorization Time Period 90 day cert    Authorization - Visit Number 4    Authorization - Number of Visits 10    Progress Note Due on Visit 10    OT Start Time 1800    OT Stop Time 1838    OT Time Calculation (min) 38 min    Activity Tolerance Patient tolerated treatment well    Behavior During Therapy [facility] for tasks assessed/performed           Past Medical History:  Diagnosis Date  . Achalasia   . Anxiety   . Dysplasia of cervix, low grade (CIN 1)   . Environmental allergies    "all year long" (12/27/2016)  . ESRD (end stage renal disease) on dialysis [facility])    "TTS; [place]" (12/27/2016)  . Fibromyalgia   . GERD (gastroesophageal reflux disease)   . Gout   . Hypertension   . IBS (irritable bowel syndrome)   . MVP (mitral valve prolapse)   . RA (rheumatoid arthritis) (HCC)    FOLLOWED BY DR. SHANAHAN  . Raynaud's disease   . Scleroderma ([place])   . Seasonal allergies   . Thrombocytopenia ([place]) 07/01/2016   Acute fall to 13,000 07/01/16  . Tubular adenoma 01/08/2008   CECUM  . Vitamin D deficiency     Past Surgical History:  Procedure Laterality Date  . ANKLE FRACTURE SURGERY Right   . AV FISTULA PLACEMENT Left 06/28/2016   Procedure: left arm ARTERIOVENOUS (AV) FISTULA CREATION;  Surgeon: Rosetta Posner, MD;  Location: [facility];  Service: Vascular;  Laterality: Left;  . BASCILIC VEIN TRANSPOSITION Left 09/27/2016   Procedure: LEFT UPPER ARM CEPHALIC VEIN TRANSPOSITION;   Surgeon: Rosetta Posner, MD;  Location: [facility];  Service: Vascular;  Laterality: Left;  . BREAST BIOPSY     "? side"  . CESAREAN SECTION  1994  . CO2 LASER OF CERVIX    . COLONOSCOPY W/ BIOPSIES  01/08/2008  . INSERTION OF DIALYSIS CATHETER Right 06/28/2016   Procedure: INSERTION OF DIALYSIS CATHETER, right internal jugular;  Surgeon: Rosetta Posner, MD;  Location: [facility];  Service: Vascular;  Laterality: Right;  . MYOMECTOMY    . NASAL ENDOSCOPY WITH EPISTAXIS CONTROL N/A 12/29/2019   Procedure: NASAL ENDOSCOPY WITH EPISTAXIS CONTROL;  Surgeon: Leta Baptist, MD;  Location: [facility];  Service: ENT;  Laterality: N/A;  . PELVIC LAPAROSCOPY  2011  . superficial thrombophlebitis Left 07-2014    There were no vitals filed for this visit.   Subjective Assessment - 02/27/20 1801    Subjective  Pt tired after PT. Pt reports some pain in the right shoulder.    Pertinent History Scleroderma, RA,HTN, ESRD (dialysis), hx R ankle Fx, 2000, anxiety,fibromyalgia, gout, HTN, MVP, Raynaud's  disease, C section, ostructive lung disease, B CTS, hereditary and periferal neuropathy, venous reflux, ICH    Limitations NO BP LUE due to fistula    Patient Stated Goals write and use RUE better, drive  agin    Currently in Pain? Yes    Pain Score 6     Pain Location Shoulder    Pain Orientation Right    Pain Descriptors / Indicators Aching;Sore    Pain Type Acute pain    Pain Onset 1 to 4 weeks ago    Pain Frequency Intermittent                        OT Treatments/Exercises (OP) - 02/27/20 1803      Functional Reaching Activities   Mid Level RUE reaching to antenna 1-8# clothespins - cues for holding head up and sitting up tall      Fine Motor Coordination (Hand/Wrist)   Fine Motor Coordination Large Pegboard    Large Pegboard medium pegs in pegboard. placing with RUE and copying pattern. Pt required increased time but placed without any drops and with no difficulty copying patern. Pt removed with RUE     Handwriting tracing ABC's - cues for keeping elbow on table for stability and for holding highlighter down barrel for more control. working on distal finger control with tracing in circles/donuts.                    OT Short Term Goals - 02/13/20 1544      OT SHORT TERM GOAL #1   Title I with inital HEP.    Time 4    Period Weeks    Status New      OT SHORT TERM GOAL #2   Title Pt will demonstrate improved fine motor coordination for ADLs as evidenced by decreasing 9 hole peg test score for RUE to 52 secs or less    Baseline RUE60.56, LUE 32.81    Time 4    Period Weeks    Status New      OT SHORT TERM GOAL #3   Title Pt will perform light home management/simple cooking tasks with supervision and min v.c    Time 4    Period Weeks    Status New      OT SHORT TERM GOAL #4   Title Pt will demonstrate improved RUE functional use as eveidenced by increasing box/ blocks score to 25 blocks with RUE.    Time 4    Period Weeks    Status New      OT SHORT TERM GOAL #5   Title Pt will write a short paragraph with at least 90% legibility.    Time 4    Period Weeks    Status New      Additional Short Term Goals   Additional Short Term Goals Yes             OT Long Term Goals - 02/13/20 1550      OT LONG TERM GOAL #1   Title I with updated HEP.-04/09/20    Time 8    Period Weeks    Status New    Target Date 04/09/20      OT LONG TERM GOAL #2   Title Pt will perform basic home managment/ cooking modified Independently    Time 8    Period Weeks    Status New      OT LONG TERM GOAL #3   Title Pt will demonstrate improved fine motor coordination for ADLs as evidenced by decreasing 9 hole peg test score for RUE to 45 secs or less.    Time 8  Period Weeks    Status New      OT LONG TERM GOAL #4   Title Pt will write a short paragraph with 100% legibility.    Time 8    Period Weeks    Status New      OT LONG TERM GOAL #5   Title Pt will demonstrate  ability to retrieve a lightweight object at 125 shoulder flexion with RUE demonstrating good control.    Time 8    Period Weeks    Status New                 Plan - 02/27/20 1815    Clinical Impression Statement Pt is progressing  towards goals with improving RUE functional use.    OT Occupational Profile and History Detailed Assessment- Review of Records and additional review of physical, cognitive, psychosocial history related to current functional performance    Occupational performance deficits (Please refer to evaluation for details): ADL's;IADL's;Leisure;Rest and Sleep;Social Participation;Other    Body Structure / Function / Physical Skills ADL;Endurance;Decreased knowledge of precautions;Decreased knowledge of use of DME;Flexibility;IADL;Pain;Skin integrity;Edema;Mobility;ROM    Rehab Potential Good    Clinical Decision Making Limited treatment options, no task modification necessary    Comorbidities Affecting Occupational Performance: May have comorbidities impacting occupational performance    Comorbidities impacting occupational performance description: multiple inflamatory rheumatological conditions, kidney disease, vascular reflux,    Modification or Assistance to Complete Evaluation  No modification of tasks or assist necessary to complete eval    OT Frequency 2x / week    OT Duration 8 weeks    OT Treatment/Interventions Self-care/ADL training;Therapeutic exercise;DME and/or AE instruction;Therapist, nutritional;Compression bandaging;Other (comment);Manual Therapy;Scar mobilization;Coping strategies training;Energy conservation;Manual lymph drainage;Passive range of motion;Therapeutic activities    Plan RUE NMR, coordination, functional reach    Consulted and Agree with Plan of Care Patient           Patient will benefit from skilled therapeutic intervention in order to improve the following deficits and impairments:   Body Structure / Function / Physical  Skills: ADL,Endurance,Decreased knowledge of precautions,Decreased knowledge of use of DME,Flexibility,IADL,Pain,Skin integrity,Edema,Mobility,ROM       Visit Diagnosis: Muscle weakness (generalized)  Other lack of coordination  Other symptoms and signs involving the nervous system  Other abnormalities of gait and mobility    Problem List Patient Active Problem List   Diagnosis Date Noted  . Abnormality of gait 02/25/2020  . Protein-calorie malnutrition, severe 01/19/2020  . Epistaxis   . Sleep disturbance   . Slow transit constipation   . Hemorrhoids   . Chronic systolic congestive heart failure ([place])   . ESRD on dialysis ([place])   . Right hemiparesis ([place])   . Pressure injury of skin 12/20/2019  . Intraparenchymal hemorrhage of brain ([place]) 12/18/2019  . ICH (intracerebral hemorrhage) ([place]) 12/12/2019  . Viral disease 08/22/2019  . Chronic right-sided heart failure ([place]) 07/09/2019  . Supraventricular tachycardia ([place]) 07/09/2019  . Other cirrhosis of liver ([place]) 06/27/2019  . Heart failure ([place]) 02/21/2019  . Heart palpitations 08/14/2018  . Other fluid overload 07/27/2018  . Encounter for long-term (current) use of other medications 01/18/2018  . Hypothyroidism 01/18/2018  . Myalgia and myositis 01/18/2018  . Chronic nephritis 01/18/2018  . Other interstitial pulmonary diseases with fibrosis in diseases classified elsewhere ([place]) 01/18/2018  . Other long term (current) drug therapy 01/18/2018  . Sleep apnea 01/18/2018  . Unspecified persistent mental disorders due to conditions classified elsewhere 01/18/2018  . Venous reflux  01/18/2018  . Vitamin B12 deficiency 01/18/2018  . Vitamin D deficiency 01/18/2018  . Obstructive lung disease ([place]) 12/30/2017  . Other pruritus 12/27/2017  . Eruption cyst 12/19/2017  . 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 ([place]) 11/28/2017  . Chronic cough 11/11/2017  . Pulmonary hypertension ([place])  11/11/2017  . Pulmonary arterial hypertension ([place]) 10/07/2017  . Acute ITP ([place]) 01/05/2017  . ESRD (end stage renal disease) ([place]) 12/28/2016  . Encounter for removal of sutures 09/09/2016  . Coagulation defect, unspecified ([place]) 09/01/2016  . Underimmunization status 08/05/2016  . Hemolytic anemia ([place]) 07/26/2016  . Epistaxis, recurrent 07/26/2016  . Unspecified protein-calorie malnutrition ([place]) 07/13/2016  . Aftercare including intermittent dialysis ([place]) 07/07/2016  . Anemia in chronic kidney disease 07/07/2016  . Hypokalemia 07/07/2016  . Iron deficiency anemia, unspecified 07/07/2016  . 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 Nonrheumatic mitral (valve) prolapse 07/07/2016  . Other irritable bowel syndrome 07/07/2016  . Other secondary thrombocytopenia 07/07/2016  . Secondary hyperparathyroidism of renal origin ([place]) 07/07/2016  . Thrombocytopenia ([place]) 07/01/2016  . ARF (acute renal failure) ([place]) 06/25/2016  . Anxiety 06/25/2016  . Bilateral carpal tunnel syndrome 10/22/2015  . Chronic gout without tophus 10/22/2015  . Chronic nonintractable headache 10/08/2015  . Fibroid uterus 01/03/2012  . H/O vitamin D deficiency 01/03/2012  . Post-menopausal 01/03/2012  . Hereditary and idiopathic peripheral neuropathy 11/19/2011  . Intestinal malabsorption 11/19/2011  . Lichen planus 93/11/2160  . Low back pain 11/19/2011  . Diffuse spasm of esophagus 11/11/2011  . ESR raised 11/11/2011  . Postinflammatory pulmonary fibrosis ([place]) 11/11/2011  . Scleroderma ([place]) 11/16/2010  . Rheumatoid arthritis ([place]) 11/16/2010  . Raynaud's disease 11/16/2010  . Symptomatic menopausal or female climacteric states 11/16/2010    Zachery Conch MOT, OTR/L  02/27/2020, 6:29 PM  [facility] [address] [place], [place], [address] Phone: [phone]   Fax:  [phone]  Name: Katie Nunez MRN: [id number] Date of Birth:  31-Mar-1955

## 2020-02-27 NOTE — Telephone Encounter (Signed)
It is not possible for me to order it that way.  But I have ordered it.  Thanks.

## 2020-02-29 ENCOUNTER — Ambulatory Visit: Payer: Medicare Other | Admitting: Occupational Therapy

## 2020-02-29 ENCOUNTER — Other Ambulatory Visit: Payer: Self-pay

## 2020-02-29 ENCOUNTER — Ambulatory Visit: Payer: Medicare Other

## 2020-02-29 DIAGNOSIS — M6281 Muscle weakness (generalized): Secondary | ICD-10-CM

## 2020-02-29 DIAGNOSIS — R2681 Unsteadiness on feet: Secondary | ICD-10-CM

## 2020-02-29 DIAGNOSIS — R29818 Other symptoms and signs involving the nervous system: Secondary | ICD-10-CM

## 2020-02-29 DIAGNOSIS — I619 Nontraumatic intracerebral hemorrhage, unspecified: Secondary | ICD-10-CM | POA: Diagnosis not present

## 2020-02-29 DIAGNOSIS — R278 Other lack of coordination: Secondary | ICD-10-CM

## 2020-02-29 DIAGNOSIS — R2689 Other abnormalities of gait and mobility: Secondary | ICD-10-CM

## 2020-02-29 NOTE — Therapy (Signed)
[facility] [address] [place], [place], [address] Phone: [phone]   Fax:  [phone]  Occupational Therapy Treatment  Patient Details  Name: Katie Nunez MRN: [id number] Date of Birth: 05/25/1955 Referring Provider (OT): Molli Barrows   Encounter Date: 02/29/2020   OT End of Session - 02/29/20 1252    Visit Number 5    Number of Visits 17    Date for OT Re-Evaluation 04/09/20    Authorization Type UHC Medicare    Authorization - Visit Number 5    Authorization - Number of Visits 10    Progress Note Due on Visit 10    OT Start Time 1245   pt in BR   OT Stop Time 1315    OT Time Calculation (min) 30 min    Activity Tolerance Patient tolerated treatment well    Behavior During Therapy [facility] for tasks assessed/performed           Past Medical History:  Diagnosis Date  . Achalasia   . Anxiety   . Dysplasia of cervix, low grade (CIN 1)   . Environmental allergies    "all year long" (12/27/2016)  . ESRD (end stage renal disease) on dialysis [facility])    "TTS; [place]" (12/27/2016)  . Fibromyalgia   . GERD (gastroesophageal reflux disease)   . Gout   . Hypertension   . IBS (irritable bowel syndrome)   . MVP (mitral valve prolapse)   . RA (rheumatoid arthritis) (HCC)    FOLLOWED BY DR. SHANAHAN  . Raynaud's disease   . Scleroderma ([place])   . Seasonal allergies   . Thrombocytopenia ([place]) 07/01/2016   Acute fall to 13,000 07/01/16  . Tubular adenoma 01/08/2008   CECUM  . Vitamin D deficiency     Past Surgical History:  Procedure Laterality Date  . ANKLE FRACTURE SURGERY Right   . AV FISTULA PLACEMENT Left 06/28/2016   Procedure: left arm ARTERIOVENOUS (AV) FISTULA CREATION;  Surgeon: Rosetta Posner, MD;  Location: [facility];  Service: Vascular;  Laterality: Left;  . BASCILIC VEIN TRANSPOSITION Left 09/27/2016   Procedure: LEFT UPPER ARM CEPHALIC VEIN TRANSPOSITION;  Surgeon: Rosetta Posner, MD;   Location: [facility];  Service: Vascular;  Laterality: Left;  . BREAST BIOPSY     "? side"  . CESAREAN SECTION  1994  . CO2 LASER OF CERVIX    . COLONOSCOPY W/ BIOPSIES  01/08/2008  . INSERTION OF DIALYSIS CATHETER Right 06/28/2016   Procedure: INSERTION OF DIALYSIS CATHETER, right internal jugular;  Surgeon: Rosetta Posner, MD;  Location: [facility];  Service: Vascular;  Laterality: Right;  . MYOMECTOMY    . NASAL ENDOSCOPY WITH EPISTAXIS CONTROL N/A 12/29/2019   Procedure: NASAL ENDOSCOPY WITH EPISTAXIS CONTROL;  Surgeon: Leta Baptist, MD;  [facility];  Service: ENT;  Laterality: N/A;  . PELVIC LAPAROSCOPY  2011  . superficial thrombophlebitis Left 07-2014    There were no vitals filed for this visit.   Subjective Assessment - 02/29/20 1244    Subjective  Pt reports pain at border of shoulder blade    Pertinent History Scleroderma, RA,HTN, ESRD (dialysis), hx R ankle Fx, 2000, anxiety,fibromyalgia, gout, HTN, MVP, Raynaud's  disease, C section, ostructive lung disease, B CTS, hereditary and periferal neuropathy, venous reflux, ICH    Limitations NO BP LUE due to fistula    Patient Stated Goals write and use RUE better, drive agin    Currently in Pain? Yes  Pain Score 3     Pain Location Scapula    Pain Orientation Right    Pain Descriptors / Indicators Aching    Pain Type Acute pain    Pain Onset 1 to 4 weeks ago    Aggravating Factors  certain movments/ reaching    Pain Relieving Factors rest/ tylenol                     Treatment: Hot pack applied to right shoulder and scapula x 8 mins while pt placed medium pegs in pegboard with RUE, for increased fine motor coordination. No adverse reactions. Supine, shoulder and scapular mobs as well as soft tissue mobs to trigger points/ tightness at scapular border.  Pt performed closed chain shoulder horizontal abduction/ adduction and gentle lower trunk rotation, scapular retraction/ protraction AA/ROM with pt reaching for  ceiling. Hot pack x 5 mins end of session to R shoulder , no adverse reactions. Pt reports no pain end of session.            OT Education - 02/29/20 1326    Education Details bed positioning to minimize right scapular/ shoulder pain, recommendation that pt does not perform high level reaching with RUe to minimize pain    Person(s) Educated Patient    Methods Explanation;Demonstration;Handout    Comprehension Verbalized understanding;Returned demonstration;Verbal cues required            OT Short Term Goals - 02/13/20 1544      OT SHORT TERM GOAL #1   Title I with inital HEP.    Time 4    Period Weeks    Status New      OT SHORT TERM GOAL #2   Title Pt will demonstrate improved fine motor coordination for ADLs as evidenced by decreasing 9 hole peg test score for RUE to 52 secs or less    Baseline RUE60.56, LUE 32.81    Time 4    Period Weeks    Status New      OT SHORT TERM GOAL #3   Title Pt will perform light home management/simple cooking tasks with supervision and min v.c    Time 4    Period Weeks    Status New      OT SHORT TERM GOAL #4   Title Pt will demonstrate improved RUE functional use as eveidenced by increasing box/ blocks score to 25 blocks with RUE.    Time 4    Period Weeks    Status New      OT SHORT TERM GOAL #5   Title Pt will write a short paragraph with at least 90% legibility.    Time 4    Period Weeks    Status New      Additional Short Term Goals   Additional Short Term Goals Yes             OT Long Term Goals - 02/13/20 1550      OT LONG TERM GOAL #1   Title I with updated HEP.-04/09/20    Time 8    Period Weeks    Status New    Target Date 04/09/20      OT LONG TERM GOAL #2   Title Pt will perform basic home managment/ cooking modified Independently    Time 8    Period Weeks    Status New      OT LONG TERM GOAL #3   Title Pt will demonstrate improved fine motor  coordination for ADLs as evidenced by decreasing 9  hole peg test score for RUE to 45 secs or less.    Time 8    Period Weeks    Status New      OT LONG TERM GOAL #4   Title Pt will write a short paragraph with 100% legibility.    Time 8    Period Weeks    Status New      OT LONG TERM GOAL #5   Title Pt will demonstrate ability to retrieve a lightweight object at 125 shoulder flexion with RUE demonstrating good control.    Time 8    Period Weeks    Status New                 Plan - 02/29/20 1255    Clinical Impression Statement Pt is progressing towards goals with improving RUE functional use however sh has pain and tightness at boder of scapula today    OT Occupational Profile and History Detailed Assessment- Review of Records and additional review of physical, cognitive, psychosocial history related to current functional performance    Occupational performance deficits (Please refer to evaluation for details): ADL's;IADL's;Leisure;Rest and Sleep;Social Participation;Other    Body Structure / Function / Physical Skills ADL;Endurance;Decreased knowledge of precautions;Decreased knowledge of use of DME;Flexibility;IADL;Pain;Skin integrity;Edema;Mobility;ROM    Rehab Potential Good    OT Frequency 2x / week    OT Duration 8 weeks    OT Treatment/Interventions Self-care/ADL training;Therapeutic exercise;DME and/or AE instruction;Therapist, nutritional;Compression bandaging;Other (comment);Manual Therapy;Scar mobilization;Coping strategies training;Energy conservation;Manual lymph drainage;Passive range of motion;Therapeutic activities    Plan continue to monitor scapula pain/ tightness, issue exercises for stretching    Consulted and Agree with Plan of Care Patient           Patient will benefit from skilled therapeutic intervention in order to improve the following deficits and impairments:   Body Structure / Function / Physical Skills: ADL,Endurance,Decreased knowledge of precautions,Decreased knowledge of use of  DME,Flexibility,IADL,Pain,Skin integrity,Edema,Mobility,ROM       Visit Diagnosis: Muscle weakness (generalized)  Other lack of coordination  Other symptoms and signs involving the nervous system  Other abnormalities of gait and mobility  Unsteadiness on feet    Problem List Patient Active Problem List   Diagnosis Date Noted  . Abnormality of gait 02/25/2020  . Protein-calorie malnutrition, severe 01/19/2020  . Epistaxis   . Sleep disturbance   . Slow transit constipation   . Hemorrhoids   . Chronic systolic congestive heart failure ([place])   . ESRD on dialysis ([place])   . Right hemiparesis ([place])   . Pressure injury of skin 12/20/2019  . Intraparenchymal hemorrhage of brain ([place]) 12/18/2019  . ICH (intracerebral hemorrhage) ([place]) 12/12/2019  . Viral disease 08/22/2019  . Chronic right-sided heart failure ([place]) 07/09/2019  . Supraventricular tachycardia ([place]) 07/09/2019  . Other cirrhosis of liver ([place]) 06/27/2019  . Heart failure ([place]) 02/21/2019  . Heart palpitations 08/14/2018  . Other fluid overload 07/27/2018  . Encounter for long-term (current) use of other medications 01/18/2018  . Hypothyroidism 01/18/2018  . Myalgia and myositis 01/18/2018  . Chronic nephritis 01/18/2018  . Other interstitial pulmonary diseases with fibrosis in diseases classified elsewhere ([place]) 01/18/2018  . Other long term (current) drug therapy 01/18/2018  . Sleep apnea 01/18/2018  . Unspecified persistent mental disorders due to conditions classified elsewhere 01/18/2018  . Venous reflux 01/18/2018  . Vitamin B12 deficiency 01/18/2018  . Vitamin D deficiency 01/18/2018  . Obstructive  lung disease ([place]) 12/30/2017  . Other pruritus 12/27/2017  . Eruption cyst 12/19/2017  . 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 ([place]) 11/28/2017  . Chronic cough 11/11/2017  . Pulmonary hypertension ([place]) 11/11/2017  . Pulmonary arterial hypertension ([place]) 10/07/2017  .  Acute ITP ([place]) 01/05/2017  . ESRD (end stage renal disease) ([place]) 12/28/2016  . Encounter for removal of sutures 09/09/2016  . Coagulation defect, unspecified ([place]) 09/01/2016  . Underimmunization status 08/05/2016  . Hemolytic anemia ([place]) 07/26/2016  . Epistaxis, recurrent 07/26/2016  . Unspecified protein-calorie malnutrition ([place]) 07/13/2016  . Aftercare including intermittent dialysis ([place]) 07/07/2016  . Anemia in chronic kidney disease 07/07/2016  . Hypokalemia 07/07/2016  . Iron deficiency anemia, unspecified 07/07/2016  . 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 Nonrheumatic mitral (valve) prolapse 07/07/2016  . Other irritable bowel syndrome 07/07/2016  . Other secondary thrombocytopenia 07/07/2016  . Secondary hyperparathyroidism of renal origin ([place]) 07/07/2016  . Thrombocytopenia ([place]) 07/01/2016  . ARF (acute renal failure) ([place]) 06/25/2016  . Anxiety 06/25/2016  . Bilateral carpal tunnel syndrome 10/22/2015  . Chronic gout without tophus 10/22/2015  . Chronic nonintractable headache 10/08/2015  . Fibroid uterus 01/03/2012  . H/O vitamin D deficiency 01/03/2012  . Post-menopausal 01/03/2012  . Hereditary and idiopathic peripheral neuropathy 11/19/2011  . Intestinal malabsorption 11/19/2011  . Lichen planus 09/70/4492  . Low back pain 11/19/2011  . Diffuse spasm of esophagus 11/11/2011  . ESR raised 11/11/2011  . Postinflammatory pulmonary fibrosis ([place]) 11/11/2011  . Scleroderma ([place]) 11/16/2010  . Rheumatoid arthritis ([place]) 11/16/2010  . Raynaud's disease 11/16/2010  . Symptomatic menopausal or female climacteric states 11/16/2010    Eddis Pingleton 02/29/2020, 1:28 PM  [facility] [address] [place], [place], [address] Phone: [phone]   Fax:  [phone]  Name: Katie Nunez MRN: [id number] Date of Birth: March 21, 1955

## 2020-02-29 NOTE — Therapy (Signed)
[facility] [address] [place], [place], [address] Phone: [phone]   Fax:  [phone]  Physical Therapy Treatment  Patient Details  Name: Katie Nunez MRN: [id number] Date of Birth: 1955-11-23 Referring Provider (PT): [name] MD   Encounter Date: 02/29/2020   PT End of Session - 02/29/20 1401    Visit Number 5    Number of Visits 12    Date for PT Re-Evaluation 04/13/20    Authorization Type UHC-MC    PT Start Time 1400    PT Stop Time 1445    PT Time Calculation (min) 45 min    Equipment Utilized During Treatment Gait belt    Activity Tolerance Patient tolerated treatment well    Behavior During Therapy [facility] for tasks assessed/performed           Past Medical History:  Diagnosis Date  . Achalasia   . Anxiety   . Dysplasia of cervix, low grade (CIN 1)   . Environmental allergies    "all year long" (12/27/2016)  . ESRD (end stage renal disease) on dialysis [facility])    "TTS; [place]" (12/27/2016)  . Fibromyalgia   . GERD (gastroesophageal reflux disease)   . Gout   . Hypertension   . IBS (irritable bowel syndrome)   . MVP (mitral valve prolapse)   . RA (rheumatoid arthritis) (HCC)    FOLLOWED BY DR. SHANAHAN  . Raynaud's disease   . Scleroderma ([place])   . Seasonal allergies   . Thrombocytopenia ([place]) 07/01/2016   Acute fall to 13,000 07/01/16  . Tubular adenoma 01/08/2008   CECUM  . Vitamin D deficiency     Past Surgical History:  Procedure Laterality Date  . ANKLE FRACTURE SURGERY Right   . AV FISTULA PLACEMENT Left 06/28/2016   Procedure: left arm ARTERIOVENOUS (AV) FISTULA CREATION;  Surgeon: Rosetta Posner, MD;  Location: [facility];  Service: Vascular;  Laterality: Left;  . BASCILIC VEIN TRANSPOSITION Left 09/27/2016   Procedure: LEFT UPPER ARM CEPHALIC VEIN TRANSPOSITION;  Surgeon: Rosetta Posner, MD;  Location: [facility];  Service: Vascular;  Laterality: Left;  . BREAST BIOPSY     "?  side"  . CESAREAN SECTION  1994  . CO2 LASER OF CERVIX    . COLONOSCOPY W/ BIOPSIES  01/08/2008  . INSERTION OF DIALYSIS CATHETER Right 06/28/2016   Procedure: INSERTION OF DIALYSIS CATHETER, right internal jugular;  Surgeon: Rosetta Posner, MD;  Location: [facility];  Service: Vascular;  Laterality: Right;  . MYOMECTOMY    . NASAL ENDOSCOPY WITH EPISTAXIS CONTROL N/A 12/29/2019   Procedure: NASAL ENDOSCOPY WITH EPISTAXIS CONTROL;  Surgeon: Leta Baptist, MD;  Location: [facility] OR;  Service: ENT;  Laterality: N/A;  . PELVIC LAPAROSCOPY  2011  . superficial thrombophlebitis Left 07-2014    There were no vitals filed for this visit.   Subjective Assessment - 02/29/20 1324    Subjective no pain, falls or change in medical staus to report regarding LEs, chronic R shoulder pain slightly increased today, addressed by prior OT session    Patient is accompained by: Family member    Pertinent History Scleroderma, RA, pulmonary HTN, kidney disease (dialysis) Tu,TH and Sat, Rt ankle fx/dislocation/surgery 2000    Patient Stated Goals strengthen ankle and knee R, get off of cane                             [facility]  Adult PT Treatment/Exercise - 02/29/20 0001      Transfers   Transfers Sit to Stand    Stand to Sit 4: Min guard    Comments solid surface with OH ball raise      Ambulation/Gait   Ambulation/Gait Yes    Ambulation/Gait Assistance 4: Min guard    Ambulation/Gait Assistance Details SPC step through pattern 129f stepping over orange stripes on floor    Ambulation Distance (Feet) 115 Feet    Assistive device Straight cane    Gait Pattern Step-to pattern;Step-through pattern    Ambulation Surface Level;Indoor      Knee/Hip Exercises: Aerobic   Nustep L1 x 6"      Knee/Hip Exercises: Seated   Long Arc Quad Strengthening;Both;2 sets;15 reps    Long Arc Quad Limitations ball squeeze for [employer]              Balance Exercises - 02/29/20 0001      Balance Exercises: Standing    Retro Gait 5 reps;Theraband;Limitations    Theraband Level (Retro Gait) Level 1 (Yellow)    Retro Gait Limitations performed 5 trips at counter accompanied by tandem walking against t-band sungle UE support    Sidestepping 5 reps;Theraband    Theraband Level (Sidestepping) Level 1 (Yellow)    Sidestepping Limitations 5 trips at counter with t-band single UE support               PT Short Term Goals - 02/13/20 1517      PT SHORT TERM GOAL #1   Title Ind with initial HEP and able to demo back to PT w/o need of VCs    Baseline no HEP    Time 2    Period Weeks    Status New    Target Date 02/27/20      PT SHORT TERM GOAL #2   Title [facility] sessions for adjustment and training    Baseline not using cane prior    Time 2    Period Weeks    Status New    Target Date 02/27/20             PT Long Term Goals - 02/13/20 1520      PT LONG TERM GOAL #1   Title Patient to improve 5x STS score to < 20 sec    Baseline 24 seconds    Time 8    Period Weeks    Status New    Target Date 04/09/20      PT LONG TERM GOAL #2   Title Patient to demo 4+/5 R ankle/knee strength throughout    Baseline 4/5 R ankle knee strength    Time 8    Period Weeks    Status New    Target Date 04/09/20      PT LONG TERM GOAL #3   Title Patient to decerease TUG time to ,13.5 sec w/o AD    Baseline 18 sec TUG average of 2 trials    Time 8    Period Weeks    Status New    Target Date 04/09/20      PT LONG TERM GOAL #4   Title Ambulate 10011fwith LRAD across outdoor surfaces under distant S    Baseline limited to in home ambulation    Time 8    Period Weeks    Status New    Target Date 04/09/20  PT LONG TERM GOAL #5   Title Return to driving as appropriate    Baseline currently not driving    Time 8    Period Weeks    Status New    Target Date 04/09/20                 Plan - 02/29/20 1405    Clinical Impression Statement fatigued  today as it is the end of a long week of therapies and HD, focus was gait training and LE strengthening with focus on TKE R    Personal Factors and Comorbidities Age;Comorbidity 2    Comorbidities ESRD(HD T, Th, Sat), R ankle fx 2000    Examination-Participation Restrictions Community Activity    Rehab Potential Good    PT Frequency 2x / week    PT Duration 4 weeks    PT Treatment/Interventions ADLs/Self Care Home Management;Cryotherapy;Electrical Stimulation;Ultrasound;Therapeutic exercise;Balance training;Patient/family education;Manual techniques;Dry needling;Therapeutic activities;Neuromuscular re-education;DME Instruction;Gait training;Stair training;Functional mobility training;Orthotic Fit/Training    PT Next Visit Plan continue LE strengthening and balnce, add stepping tasks and SLS activities to challenge RLE, t-band around knees(?)    PT Home Exercise Plan heel/toe raises, standing marching    Consulted and Agree with Plan of Care Patient           Patient will benefit from skilled therapeutic intervention in order to improve the following deficits and impairments:  Postural dysfunction,Decreased balance,Impaired flexibility,Abnormal gait,Decreased mobility,Difficulty walking,Decreased coordination  Visit Diagnosis: Unsteadiness on feet  Muscle weakness (generalized)     Problem List Patient Active Problem List   Diagnosis Date Noted  . Abnormality of gait 02/25/2020  . Protein-calorie malnutrition, severe 01/19/2020  . Epistaxis   . Sleep disturbance   . Slow transit constipation   . Hemorrhoids   . Chronic systolic congestive heart failure ([place])   . ESRD on dialysis ([place])   . Right hemiparesis ([place])   . Pressure injury of skin 12/20/2019  . Intraparenchymal hemorrhage of brain ([place]) 12/18/2019  . ICH (intracerebral hemorrhage) ([place]) 12/12/2019  . Viral disease 08/22/2019  . Chronic right-sided heart failure ([place]) 07/09/2019  . Supraventricular tachycardia ([place])  07/09/2019  . Other cirrhosis of liver ([place]) 06/27/2019  . Heart failure ([place]) 02/21/2019  . Heart palpitations 08/14/2018  . Other fluid overload 07/27/2018  . Encounter for long-term (current) use of other medications 01/18/2018  . Hypothyroidism 01/18/2018  . Myalgia and myositis 01/18/2018  . Chronic nephritis 01/18/2018  . Other interstitial pulmonary diseases with fibrosis in diseases classified elsewhere ([place]) 01/18/2018  . Other long term (current) drug therapy 01/18/2018  . Sleep apnea 01/18/2018  . Unspecified persistent mental disorders due to conditions classified elsewhere 01/18/2018  . Venous reflux 01/18/2018  . Vitamin B12 deficiency 01/18/2018  . Vitamin D deficiency 01/18/2018  . Obstructive lung disease ([place]) 12/30/2017  . Other pruritus 12/27/2017  . Eruption cyst 12/19/2017  . 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 Chronic cough 11/11/2017  . Pulmonary hypertension ([place]) 11/11/2017  . Pulmonary arterial hypertension ([place]) 10/07/2017  . Acute ITP ([place]) 01/05/2017  . ESRD (end stage renal disease) ([place]) 12/28/2016  . Encounter for removal of sutures 09/09/2016  . Coagulation defect, unspecified ([place]) 09/01/2016  . Underimmunization status 08/05/2016  . Hemolytic anemia ([place]) 07/26/2016  . Epistaxis, recurrent 07/26/2016  . Unspecified protein-calorie malnutrition ([place]) 07/13/2016  . Aftercare including intermittent dialysis ([place]) 07/07/2016  . Anemia in chronic kidney disease 07/07/2016  . Hypokalemia 07/07/2016  . Iron deficiency  anemia, unspecified 07/07/2016  . 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 Nonrheumatic mitral (valve) prolapse 07/07/2016  . Other irritable bowel syndrome 07/07/2016  . Other secondary thrombocytopenia 07/07/2016  . Secondary hyperparathyroidism of renal origin ([place]) 07/07/2016  . Thrombocytopenia ([place]) 07/01/2016  . ARF (acute renal failure) ([place]) 06/25/2016  . Anxiety 06/25/2016   . Bilateral carpal tunnel syndrome 10/22/2015  . Chronic gout without tophus 10/22/2015  . Chronic nonintractable headache 10/08/2015  . Fibroid uterus 01/03/2012  . H/O vitamin D deficiency 01/03/2012  . Post-menopausal 01/03/2012  . Hereditary and idiopathic peripheral neuropathy 11/19/2011  . Intestinal malabsorption 11/19/2011  . Lichen planus 39/12/2581  . Low back pain 11/19/2011  . Diffuse spasm of esophagus 11/11/2011  . ESR raised 11/11/2011  . Postinflammatory pulmonary fibrosis ([place]) 11/11/2011  . Scleroderma ([place]) 11/16/2010  . Rheumatoid arthritis ([place]) 11/16/2010  . Raynaud's disease 11/16/2010  . Symptomatic menopausal or female climacteric states 11/16/2010    Lanice Shirts 02/29/2020, 2:13 PM  [facility] [address] [place], [place], [address] Phone: [phone]   Fax:  [phone]  Name: DEON IVEY MRN: [id number] Date of Birth: Aug 28, 1955

## 2020-03-05 ENCOUNTER — Ambulatory Visit: Payer: Medicare Other

## 2020-03-05 ENCOUNTER — Encounter: Payer: Self-pay | Admitting: Occupational Therapy

## 2020-03-05 ENCOUNTER — Other Ambulatory Visit: Payer: Self-pay

## 2020-03-05 ENCOUNTER — Ambulatory Visit: Payer: Medicare Other | Admitting: Occupational Therapy

## 2020-03-05 DIAGNOSIS — R2681 Unsteadiness on feet: Secondary | ICD-10-CM

## 2020-03-05 DIAGNOSIS — R29818 Other symptoms and signs involving the nervous system: Secondary | ICD-10-CM

## 2020-03-05 DIAGNOSIS — R278 Other lack of coordination: Secondary | ICD-10-CM

## 2020-03-05 DIAGNOSIS — M6281 Muscle weakness (generalized): Secondary | ICD-10-CM

## 2020-03-05 DIAGNOSIS — R2689 Other abnormalities of gait and mobility: Secondary | ICD-10-CM

## 2020-03-05 DIAGNOSIS — I619 Nontraumatic intracerebral hemorrhage, unspecified: Secondary | ICD-10-CM | POA: Diagnosis not present

## 2020-03-05 NOTE — Therapy (Signed)
[facility] [address] [place], [place], [address] Phone: [phone]   Fax:  [phone]  Physical Therapy Treatment  Patient Details  Name: Katie Nunez MRN: [id number] Date of Birth: 03-11-1955 Referring Provider (PT): [name] MD   Encounter Date: 03/05/2020   PT End of Session - 03/05/20 1533    Visit Number 6    Number of Visits 12    Date for PT Re-Evaluation 04/13/20    Authorization Type UHC-MC    PT Start Time 1400    PT Stop Time 1445    PT Time Calculation (min) 45 min    Equipment Utilized During Treatment Gait belt    Activity Tolerance Patient tolerated treatment well    Behavior During Therapy [facility] for tasks assessed/performed           Past Medical History:  Diagnosis Date  . Achalasia   . Anxiety   . Dysplasia of cervix, low grade (CIN 1)   . Environmental allergies    "all year long" (12/27/2016)  . ESRD (end stage renal disease) on dialysis [facility])    "TTS; [place]" (12/27/2016)  . Fibromyalgia   . GERD (gastroesophageal reflux disease)   . Gout   . Hypertension   . IBS (irritable bowel syndrome)   . MVP (mitral valve prolapse)   . RA (rheumatoid arthritis) (HCC)    FOLLOWED BY DR. SHANAHAN  . Raynaud's disease   . Scleroderma ([place])   . Seasonal allergies   . Thrombocytopenia ([place]) 07/01/2016   Acute fall to 13,000 07/01/16  . Tubular adenoma 01/08/2008   CECUM  . Vitamin D deficiency     Past Surgical History:  Procedure Laterality Date  . ANKLE FRACTURE SURGERY Right   . AV FISTULA PLACEMENT Left 06/28/2016   Procedure: left arm ARTERIOVENOUS (AV) FISTULA CREATION;  Surgeon: Rosetta Posner, MD;  Location: [facility];  Service: Vascular;  Laterality: Left;  . BASCILIC VEIN TRANSPOSITION Left 09/27/2016   Procedure: LEFT UPPER ARM CEPHALIC VEIN TRANSPOSITION;  Surgeon: Rosetta Posner, MD;  Location: [facility];  Service: Vascular;  Laterality: Left;  . BREAST BIOPSY     "?  side"  . CESAREAN SECTION  1994  . CO2 LASER OF CERVIX    . COLONOSCOPY W/ BIOPSIES  01/08/2008  . INSERTION OF DIALYSIS CATHETER Right 06/28/2016   Procedure: INSERTION OF DIALYSIS CATHETER, right internal jugular;  Surgeon: Rosetta Posner, MD;  Location: [facility];  Service: Vascular;  Laterality: Right;  . MYOMECTOMY    . NASAL ENDOSCOPY WITH EPISTAXIS CONTROL N/A 12/29/2019   Procedure: NASAL ENDOSCOPY WITH EPISTAXIS CONTROL;  Surgeon: Leta Baptist, MD;  Location: [facility] OR;  Service: ENT;  Laterality: N/A;  . PELVIC LAPAROSCOPY  2011  . superficial thrombophlebitis Left 07-2014    There were no vitals filed for this visit.   Subjective Assessment - 03/05/20 1412    Subjective no falls or med changes, continues to report generalozed weakness over past week, denies illness, R knee still wants to buckle which is he main concern    Patient is accompained by: Family member    Pertinent History Scleroderma, RA, pulmonary HTN, kidney disease (dialysis) Tu,TH and Sat, Rt ankle fx/dislocation/surgery 2000    Patient Stated Goals strengthen ankle and knee R, get off of cane  [facility] Adult PT Treatment/Exercise - 03/05/20 0001      Transfers   Transfers Sit to Stand;Stand to Sit    Sit to Stand 5: Supervision    Stand to Sit 5: Supervision;4: Min guard      Ambulation/Gait   Ambulation/Gait Yes    Ambulation/Gait Assistance 5: Supervision;4: Min guard    Ambulation/Gait Assistance Details stepping over 4 hurdles over 64f    Ambulation Distance (Feet) 80 Feet    Assistive device Straight cane    Gait Pattern Step-through pattern    Ambulation Surface Level;Indoor      Knee/Hip Exercises: Aerobic   Nustep L1 x6'      Knee/Hip Exercises: Standing   Theraband Level (Terminal Knee Extension) Level 3 (Green)    Terminal Knee Extension Limitations performed 15x2 with green band      Knee/Hip Exercises: Seated   Long Arc Quad Strengthening;Both;2 sets;15  reps;Limitations    Long Arc Quad Limitations performed with ball squeeze, 2x15               Balance Exercises - 03/05/20 0001      Balance Exercises: Standing   Step Over Hurdles / Cones 235fx4 with stepping over hurdles,   1 trip leading with R then L              PT Short Term Goals - 02/13/20 1517      PT SHORT TERM GOAL #1   Title Ind with initial HEP and able to demo back to PT w/o need of VCs    Baseline no HEP    Time 2    Period Weeks    Status New    Target Date 02/27/20      PT SHORT TERM GOAL #2   Title [facility] to PT sessions for adjustment and training    Baseline not using cane prior    Time 2    Period Weeks    Status New    Target Date 02/27/20             PT Long Term Goals - 02/13/20 1520      PT LONG TERM GOAL #1   Title Patient to improve 5x STS score to < 20 sec    Baseline 24 seconds    Time 8    Period Weeks    Status New    Target Date 04/09/20      PT LONG TERM GOAL #2   Title Patient to demo 4+/5 R ankle/knee strength throughout    Baseline 4/5 R ankle knee strength    Time 8    Period Weeks    Status New    Target Date 04/09/20      PT LONG TERM GOAL #3   Title Patient to decerease TUG time to ,13.5 sec w/o AD    Baseline 18 sec TUG average of 2 trials    Time 8    Period Weeks    Status New    Target Date 04/09/20      PT LONG TERM GOAL #4   Title Ambulate 100028fith LRAD across outdoor surfaces under distant S    Baseline limited to in home ambulation    Time 8    Period Weeks    Status New    Target Date 04/09/20      PT LONG TERM GOAL #5   Title Return to driving as appropriate    Baseline  currently not driving    Time 8    Period Weeks    Status New    Target Date 04/09/20                 Plan - 03/05/20 1534    Clinical Impression Statement todays skilled session focused on R LE strengthening and coordination as well as stepping tasks, attempted R PF taping  with kinesiotape but leukotape needed, reported fatigue following PT session today.  Awaiting brace/AFO fitting appointment, required more rest breaks today due to c/o fatigue, denies and issues with HD sessions    Personal Factors and Comorbidities Age;Comorbidity 2    Comorbidities ESRD(HD T, Th, Sat), R ankle fx 2000    Examination-Participation Restrictions Community Activity    Stability/Clinical Decision Making Stable/Uncomplicated    Clinical Decision Making Low    Rehab Potential Good    PT Frequency 2x / week    PT Duration 4 weeks    PT Treatment/Interventions ADLs/Self Care Home Management;Cryotherapy;Electrical Stimulation;Ultrasound;Therapeutic exercise;Balance training;Patient/family education;Manual techniques;Dry needling;Therapeutic activities;Neuromuscular re-education;DME Instruction;Gait training;Stair training;Functional mobility training;Orthotic Fit/Training    PT Next Visit Plan continue LE strengthening and balnce, add stepping tasks and SLS activities to challenge RLE, t-band around knees(?)    PT Home Exercise Plan heel/toe raises, standing marching    Consulted and Agree with Plan of Care Patient           Patient will benefit from skilled therapeutic intervention in order to improve the following deficits and impairments:  Postural dysfunction,Decreased balance,Impaired flexibility,Abnormal gait,Decreased mobility,Difficulty walking,Decreased coordination  Visit Diagnosis: Unsteadiness on feet  Muscle weakness (generalized)     Problem List Patient Active Problem List   Diagnosis Date Noted  . Abnormality of gait 02/25/2020  . Protein-calorie malnutrition, severe 01/19/2020  . Epistaxis   . Sleep disturbance   . Slow transit constipation   . Hemorrhoids   . Chronic systolic congestive heart failure ([place])   . ESRD on dialysis ([place])   . Right hemiparesis ([place])   . Pressure injury of skin 12/20/2019  . Intraparenchymal hemorrhage of brain ([place])  12/18/2019  . ICH (intracerebral hemorrhage) ([place]) 12/12/2019  . Viral disease 08/22/2019  . Chronic right-sided heart failure ([place]) 07/09/2019  . Supraventricular tachycardia ([place]) 07/09/2019  . Other cirrhosis of liver ([place]) 06/27/2019  . Heart failure ([place]) 02/21/2019  . Heart palpitations 08/14/2018  . Other fluid overload 07/27/2018  . Encounter for long-term (current) use of other medications 01/18/2018  . Hypothyroidism 01/18/2018  . Myalgia and myositis 01/18/2018  . Chronic nephritis 01/18/2018  . Other interstitial pulmonary diseases with fibrosis in diseases classified elsewhere ([place]) 01/18/2018  . Other long term (current) drug therapy 01/18/2018  . Sleep apnea 01/18/2018  . Unspecified persistent mental disorders due to conditions classified elsewhere 01/18/2018  . Venous reflux 01/18/2018  . Vitamin B12 deficiency 01/18/2018  . Vitamin D deficiency 01/18/2018  . Obstructive lung disease ([place]) 12/30/2017  . Other pruritus 12/27/2017  . Eruption cyst 12/19/2017  . 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 Chronic cough 11/11/2017  . Pulmonary hypertension ([place]) 11/11/2017  . Pulmonary arterial hypertension ([place]) 10/07/2017  . Acute ITP ([place]) 01/05/2017  . ESRD (end stage renal disease) ([place]) 12/28/2016  . Encounter for removal of sutures 09/09/2016  . Coagulation defect, unspecified ([place]) 09/01/2016  . Underimmunization status 08/05/2016  . Hemolytic anemia ([place]) 07/26/2016  . Epistaxis, recurrent 07/26/2016  . Unspecified protein-calorie malnutrition ([place]) 07/13/2016  . Aftercare including  intermittent dialysis ([place]) 07/07/2016  . Anemia in chronic kidney disease 07/07/2016  . Hypokalemia 07/07/2016  . Iron deficiency anemia, unspecified 07/07/2016  . 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 Nonrheumatic mitral (valve) prolapse 07/07/2016  . Other irritable bowel syndrome 07/07/2016  . Other secondary thrombocytopenia  07/07/2016  . Secondary hyperparathyroidism of renal origin ([place]) 07/07/2016  . Thrombocytopenia ([place]) 07/01/2016  . ARF (acute renal failure) ([place]) 06/25/2016  . Anxiety 06/25/2016  . Bilateral carpal tunnel syndrome 10/22/2015  . Chronic gout without tophus 10/22/2015  . Chronic nonintractable headache 10/08/2015  . Fibroid uterus 01/03/2012  . H/O vitamin D deficiency 01/03/2012  . Post-menopausal 01/03/2012  . Hereditary and idiopathic peripheral neuropathy 11/19/2011  . Intestinal malabsorption 11/19/2011  . Lichen planus 16/10/9602  . Low back pain 11/19/2011  . Diffuse spasm of esophagus 11/11/2011  . ESR raised 11/11/2011  . Postinflammatory pulmonary fibrosis ([place]) 11/11/2011  . Scleroderma ([place]) 11/16/2010  . Rheumatoid arthritis ([place]) 11/16/2010  . Raynaud's disease 11/16/2010  . Symptomatic menopausal or female climacteric states 11/16/2010    Lanice Shirts 03/05/2020, 3:46 PM  [facility] [address] [place], [place], [address] Phone: [phone]   Fax:  [phone]  Name: ADANA MARIK MRN: [id number] Date of Birth: Jan 26, 1955

## 2020-03-05 NOTE — Therapy (Signed)
[facility] [address] [place], [place], [address] Phone: [phone]   Fax:  [phone]  Occupational Therapy Treatment  Patient Details  Name: Katie Nunez MRN: [id number] Date of Birth: 08/27/1955 Referring Provider (OT): Molli Barrows   Encounter Date: 03/05/2020   OT End of Session - 03/05/20 1522    Visit Number 6    Number of Visits 17    Date for OT Re-Evaluation 04/09/20    Authorization Type UHC Medicare    Authorization Time Period 90 day cert    Authorization - Visit Number 6    Authorization - Number of Visits 10    Progress Note Due on Visit 10    OT Start Time 1448    OT Stop Time 1530    OT Time Calculation (min) 42 min    Activity Tolerance Patient tolerated treatment well    Behavior During Therapy [facility] for tasks assessed/performed           Past Medical History:  Diagnosis Date  . Achalasia   . Anxiety   . Dysplasia of cervix, low grade (CIN 1)   . Environmental allergies    "all year long" (12/27/2016)  . ESRD (end stage renal disease) on dialysis [facility])    "TTS; [place]" (12/27/2016)  . Fibromyalgia   . GERD (gastroesophageal reflux disease)   . Gout   . Hypertension   . IBS (irritable bowel syndrome)   . MVP (mitral valve prolapse)   . RA (rheumatoid arthritis) (HCC)    FOLLOWED BY DR. SHANAHAN  . Raynaud's disease   . Scleroderma ([place])   . Seasonal allergies   . Thrombocytopenia ([place]) 07/01/2016   Acute fall to 13,000 07/01/16  . Tubular adenoma 01/08/2008   CECUM  . Vitamin D deficiency     Past Surgical History:  Procedure Laterality Date  . ANKLE FRACTURE SURGERY Right   . AV FISTULA PLACEMENT Left 06/28/2016   Procedure: left arm ARTERIOVENOUS (AV) FISTULA CREATION;  Surgeon: Rosetta Posner, MD;  Location: [place];  Service: Vascular;  Laterality: Left;  . BASCILIC VEIN TRANSPOSITION Left 09/27/2016   Procedure: LEFT UPPER ARM CEPHALIC VEIN TRANSPOSITION;   Surgeon: Rosetta Posner, MD;  Location: [place];  Service: Vascular;  Laterality: Left;  . BREAST BIOPSY     "? side"  . CESAREAN SECTION  1994  . CO2 LASER OF CERVIX    . COLONOSCOPY W/ BIOPSIES  01/08/2008  . INSERTION OF DIALYSIS CATHETER Right 06/28/2016   Procedure: INSERTION OF DIALYSIS CATHETER, right internal jugular;  Surgeon: Rosetta Posner, MD;  Location: [place];  Service: Vascular;  Laterality: Right;  . MYOMECTOMY    . NASAL ENDOSCOPY WITH EPISTAXIS CONTROL N/A 12/29/2019   Procedure: NASAL ENDOSCOPY WITH EPISTAXIS CONTROL;  Surgeon: Leta Baptist, MD;  Location: [facility] OR;  Service: ENT;  Laterality: N/A;  . PELVIC LAPAROSCOPY  2011  . superficial thrombophlebitis Left 07-2014    There were no vitals filed for this visit.   Subjective Assessment - 03/05/20 1455    Subjective  Pt reports no pain today    Pertinent History Scleroderma, RA,HTN, ESRD (dialysis), hx R ankle Fx, 2000, anxiety,fibromyalgia, gout, HTN, MVP, Raynaud's  disease, C section, ostructive lung disease, B CTS, hereditary and periferal neuropathy, venous reflux, ICH    Limitations NO BP LUE due to fistula    Patient Stated Goals write and use RUE better, drive agin    Currently in Pain?  No/denies                 Treatment: Pt reports shoulder tightness today. Gentle scapular mobs to right scapula. Gentle self stretches with towel on tabletop for shoulder flexion, followed by reach for the floor. Kinesiotape applied to shoulder and traps right side to relax tight trapezius muscles, small corrective tape at base of right scapula. Pt was instructed in wear and care and she verbalized understanding. Hotpack applied to right shoulder while pt performed tracing activity then writing sentences with foam grip. Good legibility, min v.c for upright posture. No adverse reactions to heat. Pt reports shoulder feels better end of session. Pt was instructed in bed positioning to minimize pain, handout  provided.                 OT Short Term Goals - 02/13/20 1544      OT SHORT TERM GOAL #1   Title I with inital HEP.    Time 4    Period Weeks    Status New      OT SHORT TERM GOAL #2   Title Pt will demonstrate improved fine motor coordination for ADLs as evidenced by decreasing 9 hole peg test score for RUE to 52 secs or less    Baseline RUE60.56, LUE 32.81    Time 4    Period Weeks    Status New      OT SHORT TERM GOAL #3   Title Pt will perform light home management/simple cooking tasks with supervision and min v.c    Time 4    Period Weeks    Status New      OT SHORT TERM GOAL #4   Title Pt will demonstrate improved RUE functional use as eveidenced by increasing box/ blocks score to 25 blocks with RUE.    Time 4    Period Weeks    Status New      OT SHORT TERM GOAL #5   Title Pt will write a short paragraph with at least 90% legibility.    Time 4    Period Weeks    Status New      Additional Short Term Goals   Additional Short Term Goals Yes             OT Long Term Goals - 02/13/20 1550      OT LONG TERM GOAL #1   Title I with updated HEP.-04/09/20    Time 8    Period Weeks    Status New    Target Date 04/09/20      OT LONG TERM GOAL #2   Title Pt will perform basic home managment/ cooking modified Independently    Time 8    Period Weeks    Status New      OT LONG TERM GOAL #3   Title Pt will demonstrate improved fine motor coordination for ADLs as evidenced by decreasing 9 hole peg test score for RUE to 45 secs or less.    Time 8    Period Weeks    Status New      OT LONG TERM GOAL #4   Title Pt will write a short paragraph with 100% legibility.    Time 8    Period Weeks    Status New      OT LONG TERM GOAL #5   Title Pt will demonstrate ability to retrieve a lightweight object at 125 shoulder flexion with RUE demonstrating good control.  Time 8    Period Weeks    Status New                 Plan - 03/05/20 1525     Clinical Impression Statement Pt is progressing towards goals with improving handwriting. Pt reports continued right shoulder and scapular tightness however she reports tightness, not pain today.    OT Occupational Profile and History Detailed Assessment- Review of Records and additional review of physical, cognitive, psychosocial history related to current functional performance    Occupational performance deficits (Please refer to evaluation for details): ADL's;IADL's;Leisure;Rest and Sleep;Social Participation;Other    Body Structure / Function / Physical Skills ADL;Endurance;Decreased knowledge of precautions;Decreased knowledge of use of DME;Flexibility;IADL;Pain;Skin integrity;Edema;Mobility;ROM    Rehab Potential Good    OT Frequency 2x / week    OT Duration 8 weeks    OT Treatment/Interventions Self-care/ADL training;Therapeutic exercise;DME and/or AE instruction;Therapist, nutritional;Compression bandaging;Other (comment);Manual Therapy;Scar mobilization;Coping strategies training;Energy conservation;Manual lymph drainage;Passive range of motion;Therapeutic activities    Plan continue to monitor scapula pain/ tightness,fine motor coordination issue exercises for stretching    Consulted and Agree with Plan of Care Patient           Patient will benefit from skilled therapeutic intervention in order to improve the following deficits and impairments:   Body Structure / Function / Physical Skills: ADL,Endurance,Decreased knowledge of precautions,Decreased knowledge of use of DME,Flexibility,IADL,Pain,Skin integrity,Edema,Mobility,ROM       Visit Diagnosis: Muscle weakness (generalized)  Other lack of coordination  Other symptoms and signs involving the nervous system  Other abnormalities of gait and mobility  Unsteadiness on feet    Problem List Patient Active Problem List   Diagnosis Date Noted  . Abnormality of gait 02/25/2020  . Protein-calorie malnutrition,  severe 01/19/2020  . Epistaxis   . Sleep disturbance   . Slow transit constipation   . Hemorrhoids   . Chronic systolic congestive heart failure ([place])   . ESRD on dialysis ([place])   . Right hemiparesis ([place])   . Pressure injury of skin 12/20/2019  . Intraparenchymal hemorrhage of brain ([place]) 12/18/2019  . ICH (intracerebral hemorrhage) ([place]) 12/12/2019  . Viral disease 08/22/2019  . Chronic right-sided heart failure ([place]) 07/09/2019  . Supraventricular tachycardia ([place]) 07/09/2019  . Other cirrhosis of liver ([place]) 06/27/2019  . Heart failure ([place]) 02/21/2019  . Heart palpitations 08/14/2018  . Other fluid overload 07/27/2018  . Encounter for long-term (current) use of other medications 01/18/2018  . Hypothyroidism 01/18/2018  . Myalgia and myositis 01/18/2018  . Chronic nephritis 01/18/2018  . Other interstitial pulmonary diseases with fibrosis in diseases classified elsewhere ([place]) 01/18/2018  . Other long term (current) drug therapy 01/18/2018  . Sleep apnea 01/18/2018  . Unspecified persistent mental disorders due to conditions classified elsewhere 01/18/2018  . Venous reflux 01/18/2018  . Vitamin B12 deficiency 01/18/2018  . Vitamin D deficiency 01/18/2018  . Obstructive lung disease ([place]) 12/30/2017  . Other pruritus 12/27/2017  . Eruption cyst 12/19/2017  . 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 ([place]) 11/28/2017  . Chronic cough 11/11/2017  . Pulmonary hypertension ([place]) 11/11/2017  . Pulmonary arterial hypertension ([place]) 10/07/2017  . Acute ITP ([place]) 01/05/2017  . ESRD (end stage renal disease) ([place]) 12/28/2016  . Encounter for removal of sutures 09/09/2016  . Coagulation defect, unspecified ([place]) 09/01/2016  . Underimmunization status 08/05/2016  . Hemolytic anemia ([place]) 07/26/2016  . Epistaxis, recurrent 07/26/2016  . Unspecified protein-calorie malnutrition ([place]) 07/13/2016  . Aftercare including  intermittent dialysis ([place])  07/07/2016  . Anemia in chronic kidney disease 07/07/2016  . Hypokalemia 07/07/2016  . Iron deficiency anemia, unspecified 07/07/2016  . 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 Nonrheumatic mitral (valve) prolapse 07/07/2016  . Other irritable bowel syndrome 07/07/2016  . Other secondary thrombocytopenia 07/07/2016  . Secondary hyperparathyroidism of renal origin ([place]) 07/07/2016  . Thrombocytopenia ([place]) 07/01/2016  . ARF (acute renal failure) ([place]) 06/25/2016  . Anxiety 06/25/2016  . Bilateral carpal tunnel syndrome 10/22/2015  . Chronic gout without tophus 10/22/2015  . Chronic nonintractable headache 10/08/2015  . Fibroid uterus 01/03/2012  . H/O vitamin D deficiency 01/03/2012  . Post-menopausal 01/03/2012  . Hereditary and idiopathic peripheral neuropathy 11/19/2011  . Intestinal malabsorption 11/19/2011  . Lichen planus 49/66/4660  . Low back pain 11/19/2011  . Diffuse spasm of esophagus 11/11/2011  . ESR raised 11/11/2011  . Postinflammatory pulmonary fibrosis ([place]) 11/11/2011  . Scleroderma ([place]) 11/16/2010  . Rheumatoid arthritis ([place]) 11/16/2010  . Raynaud's disease 11/16/2010  . Symptomatic menopausal or female climacteric states 11/16/2010    RINE,KATHRYN 03/05/2020, 3:41 PM  [facility] [address] [place], [place], [address] Phone: [phone]   Fax:  [phone]  Name: LIANNE CARRETO MRN: [id number] Date of Birth: Aug 31, 1955

## 2020-03-05 NOTE — Patient Instructions (Signed)
Kinesiotape wear/ care  If the tape bothers you or you have an allergic reaction, wet it and then remove slowly. If no problems you can wear the tape for 3-5 days before removal.

## 2020-03-07 ENCOUNTER — Other Ambulatory Visit: Payer: Self-pay

## 2020-03-07 ENCOUNTER — Ambulatory Visit (INDEPENDENT_AMBULATORY_CARE_PROVIDER_SITE_OTHER): Payer: Medicare Other | Admitting: Podiatrist

## 2020-03-07 ENCOUNTER — Ambulatory Visit: Payer: Medicare Other | Admitting: Occupational Therapy

## 2020-03-07 ENCOUNTER — Ambulatory Visit: Payer: Medicare Other

## 2020-03-07 DIAGNOSIS — M79675 Pain in left toe(s): Secondary | ICD-10-CM

## 2020-03-07 DIAGNOSIS — M6281 Muscle weakness (generalized): Secondary | ICD-10-CM

## 2020-03-07 DIAGNOSIS — R278 Other lack of coordination: Secondary | ICD-10-CM

## 2020-03-07 DIAGNOSIS — R2681 Unsteadiness on feet: Secondary | ICD-10-CM

## 2020-03-07 DIAGNOSIS — M79674 Pain in right toe(s): Secondary | ICD-10-CM

## 2020-03-07 DIAGNOSIS — R2689 Other abnormalities of gait and mobility: Secondary | ICD-10-CM

## 2020-03-07 DIAGNOSIS — L84 Corns and callosities: Secondary | ICD-10-CM

## 2020-03-07 DIAGNOSIS — I619 Nontraumatic intracerebral hemorrhage, unspecified: Secondary | ICD-10-CM | POA: Diagnosis not present

## 2020-03-07 DIAGNOSIS — R29818 Other symptoms and signs involving the nervous system: Secondary | ICD-10-CM

## 2020-03-07 DIAGNOSIS — M2012 Hallux valgus (acquired), left foot: Secondary | ICD-10-CM

## 2020-03-07 MED ORDER — LIDOCAINE-PRILOCAINE 2.5-2.5 % EX CREA: 1.0000 "application " | TOPICAL_CREAM | CUTANEOUS | 3 refills | Status: DC | PRN

## 2020-03-07 NOTE — Therapy (Signed)
[facility] [address] [place], [place], [address] Phone: [phone]   Fax:  [phone]  Occupational Therapy Treatment  Patient Details  Name: Katie Nunez MRN: [id number] Date of Birth: 04/01/1955 Referring Provider (OT): Molli Barrows   Encounter Date: 03/07/2020   OT End of Session - 03/07/20 1328    Visit Number 7    Number of Visits 17    Date for OT Re-Evaluation 04/09/20    Authorization Type UHC Medicare    Authorization Time Period 90 day cert    Authorization - Visit Number 7    Authorization - Number of Visits 10    Progress Note Due on Visit 10    OT Start Time 1327    OT Stop Time 1400    OT Time Calculation (min) 33 min    Activity Tolerance Patient tolerated treatment well    Behavior During Therapy [facility] for tasks assessed/performed           Past Medical History:  Diagnosis Date  . Achalasia   . Anxiety   . Dysplasia of cervix, low grade (CIN 1)   . Environmental allergies    "all year long" (12/27/2016)  . ESRD (end stage renal disease) on dialysis [facility])    "TTS; [place]" (12/27/2016)  . Fibromyalgia   . GERD (gastroesophageal reflux disease)   . Gout   . Hypertension   . IBS (irritable bowel syndrome)   . MVP (mitral valve prolapse)   . RA (rheumatoid arthritis) (HCC)    FOLLOWED BY DR. SHANAHAN  . Raynaud's disease   . Scleroderma ([place])   . Seasonal allergies   . Thrombocytopenia ([place]) 07/01/2016   Acute fall to 13,000 07/01/16  . Tubular adenoma 01/08/2008   CECUM  . Vitamin D deficiency     Past Surgical History:  Procedure Laterality Date  . ANKLE FRACTURE SURGERY Right   . AV FISTULA PLACEMENT Left 06/28/2016   Procedure: left arm ARTERIOVENOUS (AV) FISTULA CREATION;  Surgeon: Rosetta Posner, MD;  Location: [place];  Service: Vascular;  Laterality: Left;  . BASCILIC VEIN TRANSPOSITION Left 09/27/2016   Procedure: LEFT UPPER ARM CEPHALIC VEIN TRANSPOSITION;   Surgeon: Rosetta Posner, MD;  Location: [place];  Service: Vascular;  Laterality: Left;  . BREAST BIOPSY     "? side"  . CESAREAN SECTION  1994  . CO2 LASER OF CERVIX    . COLONOSCOPY W/ BIOPSIES  01/08/2008  . INSERTION OF DIALYSIS CATHETER Right 06/28/2016   Procedure: INSERTION OF DIALYSIS CATHETER, right internal jugular;  Surgeon: Rosetta Posner, MD;  Location: [place];  Service: Vascular;  Laterality: Right;  . MYOMECTOMY    . NASAL ENDOSCOPY WITH EPISTAXIS CONTROL N/A 12/29/2019   Procedure: NASAL ENDOSCOPY WITH EPISTAXIS CONTROL;  Surgeon: Leta Baptist, MD;  Location[facility] OR;  Service: ENT;  Laterality: N/A;  . PELVIC LAPAROSCOPY  2011  . superficial thrombophlebitis Left 07-2014    There were no vitals filed for this visit.   Subjective Assessment - 03/07/20 1328    Subjective  Pt reports tightness in RUE shoulder. "I did write a paragraph the other day"    Pertinent History Scleroderma, RA,HTN, ESRD (dialysis), hx R ankle Fx, 2000, anxiety,fibromyalgia, gout, HTN, MVP, Raynaud's  disease, C section, ostructive lung disease, B CTS, hereditary and periferal neuropathy, venous reflux, ICH    Limitations NO BP LUE due to fistula    Patient Stated Goals write and use RUE  better, drive agin    Currently in Pain? Yes    Pain Score 6     Pain Location Shoulder    Pain Orientation Right    Pain Descriptors / Indicators Aching;Tightness    Pain Type Acute pain    Pain Onset 1 to 4 weeks ago    Pain Frequency Intermittent    Aggravating Factors  pt reports sleeping might make it worse                        OT Treatments/Exercises (OP) - 03/07/20 1334      Modalities   Modalities Moist Heat      Moist Heat Therapy   Number Minutes Moist Heat 10 Minutes    Moist Heat Location Shoulder   RUE moist heat while completing fine motor tasks.     Fine Motor Coordination (Hand/Wrist)   Fine Motor Coordination Grooved pegs;Small Pegboard    Small Pegboard small pegboard with RUE  with mod difficulty and fatigue with RUE. Pt required increased time to complete task.   Grooved pegs Grooved Pegs with RUE with min difficulty and drops. Pt with shaking without stability at elbow and shoulder.                    OT Short Term Goals - 02/13/20 1544      OT SHORT TERM GOAL #1   Title I with inital HEP.    Time 4    Period Weeks    Status New      OT SHORT TERM GOAL #2   Title Pt will demonstrate improved fine motor coordination for ADLs as evidenced by decreasing 9 hole peg test score for RUE to 52 secs or less    Baseline RUE60.56, LUE 32.81    Time 4    Period Weeks    Status New      OT SHORT TERM GOAL #3   Title Pt will perform light home management/simple cooking tasks with supervision and min v.c    Time 4    Period Weeks    Status New      OT SHORT TERM GOAL #4   Title Pt will demonstrate improved RUE functional use as eveidenced by increasing box/ blocks score to 25 blocks with RUE.    Time 4    Period Weeks    Status New      OT SHORT TERM GOAL #5   Title Pt will write a short paragraph with at least 90% legibility.    Time 4    Period Weeks    Status New      Additional Short Term Goals   Additional Short Term Goals Yes             OT Long Term Goals - 02/13/20 1550      OT LONG TERM GOAL #1   Title I with updated HEP.-04/09/20    Time 8    Period Weeks    Status New    Target Date 04/09/20      OT LONG TERM GOAL #2   Title Pt will perform basic home managment/ cooking modified Independently    Time 8    Period Weeks    Status New      OT LONG TERM GOAL #3   Title Pt will demonstrate improved fine motor coordination for ADLs as evidenced by decreasing 9 hole peg test score for RUE to 45 secs or  less.    Time 8    Period Weeks    Status New      OT LONG TERM GOAL #4   Title Pt will write a short paragraph with 100% legibility.    Time 8    Period Weeks    Status New      OT LONG TERM GOAL #5   Title Pt will  demonstrate ability to retrieve a lightweight object at 125 shoulder flexion with RUE demonstrating good control.    Time 8    Period Weeks    Status New                 Plan - 03/07/20 1342    Clinical Impression Statement Pt continues to have some tightness in RUE shoulder. Pt improving with fine motor coordination in RUE.    OT Occupational Profile and History Detailed Assessment- Review of Records and additional review of physical, cognitive, psychosocial history related to current functional performance    Occupational performance deficits (Please refer to evaluation for details): ADL's;IADL's;Leisure;Rest and Sleep;Social Participation;Other    Body Structure / Function / Physical Skills ADL;Endurance;Decreased knowledge of precautions;Decreased knowledge of use of DME;Flexibility;IADL;Pain;Skin integrity;Edema;Mobility;ROM    Rehab Potential Good    OT Frequency 2x / week    OT Duration 8 weeks    OT Treatment/Interventions Self-care/ADL training;Therapeutic exercise;DME and/or AE instruction;Therapist, nutritional;Compression bandaging;Other (comment);Manual Therapy;Scar mobilization;Coping strategies training;Energy conservation;Manual lymph drainage;Passive range of motion;Therapeutic activities    Plan continue to monitor scapula pain/ tightness,fine motor coordination issue exercises for stretching    Consulted and Agree with Plan of Care Patient           Patient will benefit from skilled therapeutic intervention in order to improve the following deficits and impairments:   Body Structure / Function / Physical Skills: ADL,Endurance,Decreased knowledge of precautions,Decreased knowledge of use of DME,Flexibility,IADL,Pain,Skin integrity,Edema,Mobility,ROM       Visit Diagnosis: Muscle weakness (generalized)  Other lack of coordination  Unsteadiness on feet  Other abnormalities of gait and mobility  Other symptoms and signs involving the nervous  system    Problem List Patient Active Problem List   Diagnosis Date Noted  . Abnormality of gait 02/25/2020  . Protein-calorie malnutrition, severe 01/19/2020  . Epistaxis   . Sleep disturbance   . Slow transit constipation   . Hemorrhoids   . Chronic systolic congestive heart failure ([place])   . ESRD on dialysis ([place])   . Right hemiparesis ([place])   . Pressure injury of skin 12/20/2019  . Intraparenchymal hemorrhage of brain ([place]) 12/18/2019  . ICH (intracerebral hemorrhage) ([place]) 12/12/2019  . Viral disease 08/22/2019  . Chronic right-sided heart failure ([place]) 07/09/2019  . Supraventricular tachycardia ([place]) 07/09/2019  . Other cirrhosis of liver ([place]) 06/27/2019  . Heart failure ([place]) 02/21/2019  . Heart palpitations 08/14/2018  . Other fluid overload 07/27/2018  . Encounter for long-term (current) use of other medications 01/18/2018  . Hypothyroidism 01/18/2018  . Myalgia and myositis 01/18/2018  . Chronic nephritis 01/18/2018  . Other interstitial pulmonary diseases with fibrosis in diseases classified elsewhere ([place]) 01/18/2018  . Other long term (current) drug therapy 01/18/2018  . Sleep apnea 01/18/2018  . Unspecified persistent mental disorders due to conditions classified elsewhere 01/18/2018  . Venous reflux 01/18/2018  . Vitamin B12 deficiency 01/18/2018  . Vitamin D deficiency 01/18/2018  . Obstructive lung disease ([place]) 12/30/2017  . Other pruritus 12/27/2017  . Eruption cyst 12/19/2017  . Pulmonary artery hypertension associated  with connective tissue disease ([place]) 11/28/2017  . Chronic cough 11/11/2017  . Pulmonary hypertension ([place]) 11/11/2017  . Pulmonary arterial hypertension ([place]) 10/07/2017  . Acute ITP ([place]) 01/05/2017  . ESRD (end stage renal disease) ([place]) 12/28/2016  . Encounter for removal of sutures 09/09/2016  . Coagulation defect, unspecified ([place]) 09/01/2016  . Underimmunization status 08/05/2016  . Hemolytic anemia ([place]) 07/26/2016  .  Epistaxis, recurrent 07/26/2016  . Unspecified protein-calorie malnutrition ([place]) 07/13/2016  . Aftercare including intermittent dialysis ([place]) 07/07/2016  . Anemia in chronic kidney disease 07/07/2016  . Hypokalemia 07/07/2016  . Iron deficiency anemia, unspecified 07/07/2016  . 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 Nonrheumatic mitral (valve) prolapse 07/07/2016  . Other irritable bowel syndrome 07/07/2016  . Other secondary thrombocytopenia 07/07/2016  . Secondary hyperparathyroidism of renal origin ([place]) 07/07/2016  . Thrombocytopenia ([place]) 07/01/2016  . ARF (acute renal failure) ([place]) 06/25/2016  . Anxiety 06/25/2016  . Bilateral carpal tunnel syndrome 10/22/2015  . Chronic gout without tophus 10/22/2015  . Chronic nonintractable headache 10/08/2015  . Fibroid uterus 01/03/2012  . H/O vitamin D deficiency 01/03/2012  . Post-menopausal 01/03/2012  . Hereditary and idiopathic peripheral neuropathy 11/19/2011  . Intestinal malabsorption 11/19/2011  . Lichen planus 92/33/0076  . Low back pain 11/19/2011  . Diffuse spasm of esophagus 11/11/2011  . ESR raised 11/11/2011  . Postinflammatory pulmonary fibrosis ([place]) 11/11/2011  . Scleroderma ([place]) 11/16/2010  . Rheumatoid arthritis ([place]) 11/16/2010  . Raynaud's disease 11/16/2010  . Symptomatic menopausal or female climacteric states 11/16/2010    Zachery Conch MOT, OTR/L  03/07/2020, 1:42 PM  [facility] [address] [place], [place], [address] Phone: [phone]   Fax:  [phone]  Name: Katie Nunez MRN: [id number] Date of Birth: 1955/05/29

## 2020-03-08 NOTE — Therapy (Signed)
[facility] [address] [place], [place], [address] Phone: [phone]   Fax:  [phone]  Physical Therapy Treatment  Patient Details  Name: Katie Nunez MRN: [id number] Date of Birth: July 04, 1955 Referring Provider (PT): [name] MD   Encounter Date: 03/07/2020   PT End of Session - 03/07/20 0841    Visit Number 7    Number of Visits 12    Date for PT Re-Evaluation 04/13/20    Authorization Type [employer]    PT Start Time 1400    PT Stop Time 1445    PT Time Calculation (min) 45 min    Equipment Utilized During Treatment Gait belt    Activity Tolerance Patient tolerated treatment well    Behavior During Therapy [facility] for tasks assessed/performed           Past Medical History:  Diagnosis Date  . Achalasia   . Anxiety   . Dysplasia of cervix, low grade (CIN 1)   . Environmental allergies    "all year long" (12/27/2016)  . ESRD (end stage renal disease) on dialysis [facility])    "TTS; [place]" (12/27/2016)  . Fibromyalgia   . GERD (gastroesophageal reflux disease)   . Gout   . Hypertension   . IBS (irritable bowel syndrome)   . MVP (mitral valve prolapse)   . RA (rheumatoid arthritis) (HCC)    FOLLOWED BY DR. SHANAHAN  . Raynaud's disease   . Scleroderma ([place])   . Seasonal allergies   . Thrombocytopenia ([place]) 07/01/2016   Acute fall to 13,000 07/01/16  . Tubular adenoma 01/08/2008   CECUM  . Vitamin D deficiency     Past Surgical History:  Procedure Laterality Date  . ANKLE FRACTURE SURGERY Right   . AV FISTULA PLACEMENT Left 06/28/2016   Procedure: left arm ARTERIOVENOUS (AV) FISTULA CREATION;  Surgeon: Rosetta Posner, MD;  Location: [place];  Service: Vascular;  Laterality: Left;  . BASCILIC VEIN TRANSPOSITION Left 09/27/2016   Procedure: LEFT UPPER ARM CEPHALIC VEIN TRANSPOSITION;  Surgeon: Rosetta Posner, MD;  Location: [place];  Service: Vascular;  Laterality: Left;  . BREAST BIOPSY     "?  side"  . CESAREAN SECTION  1994  . CO2 LASER OF CERVIX    . COLONOSCOPY W/ BIOPSIES  01/08/2008  . INSERTION OF DIALYSIS CATHETER Right 06/28/2016   Procedure: INSERTION OF DIALYSIS CATHETER, right internal jugular;  Surgeon: Rosetta Posner, MD;  Location: [place];  Service: Vascular;  Laterality: Right;  . MYOMECTOMY    . NASAL ENDOSCOPY WITH EPISTAXIS CONTROL N/A 12/29/2019   Procedure: NASAL ENDOSCOPY WITH EPISTAXIS CONTROL;  Surgeon: Leta Baptist, MD;  Location: [facility] OR;  Service: ENT;  Laterality: N/A;  . PELVIC LAPAROSCOPY  2011  . superficial thrombophlebitis Left 07-2014    There were no vitals filed for this visit.   Subjective Assessment - 03/07/20 1405    Subjective low BP at HD yesterday but stabilized by end of day, no change in R knee pain overall but slightly less today    Currently in Pain? Yes    Pain Score 2     Pain Location Knee    Pain Orientation Right    Pain Descriptors / Indicators Aching    Pain Onset 1 to 4 weeks ago                             [facility] Adult  PT Treatment/Exercise - 03/07/20 1423      Transfers   Transfers Sit to Stand    Sit to Stand 5: Supervision    Sit to Stand Details Tactile cues for sequencing    Comments solid surface 1x10      Knee/Hip Exercises: Aerobic   Nustep L1 x 6'      Knee/Hip Exercises: Seated   Long Arc Quad Strengthening;Both;2 sets;15 reps    Long Arc Quad Limitations 1# physioball used              03/07/20 0001  Balance Exercises: Standing  Sidestepping Limitations 5 trips at counter with PT assist to position pelvis  Step Over Hurdles / Cones 90f x4 with SPC, alt leading leg  Heel Raises Both;10 reps;Limitations  Heel Raises Limitations HHA, 2x10  Toe Raise Both;10 reps;Limitations  Toe Raise Limitations HHA 2x10           PT Short Term Goals - 02/13/20 1517      PT SHORT TERM GOAL #1   Title Ind with initial HEP and able to demo back to PT w/o need of VCs    Baseline no HEP     Time 2    Period Weeks    Status New    Target Date 02/27/20      PT SHORT TERM GOAL #2   [facility]    Baseline not using cane prior    Time 2    Period Weeks    Status New    Target Date 02/27/20             PT Long Term Goals - 02/13/20 1520      PT LONG TERM GOAL #1   Title Patient to improve 5x STS score to < 20 sec    Baseline 24 seconds    Time 8    Period Weeks    Status New    Target Date 04/09/20      PT LONG TERM GOAL #2   Title Patient to demo 4+/5 R ankle/knee strength throughout    Baseline 4/5 R ankle knee strength    Time 8    Period Weeks    Status New    Target Date 04/09/20      PT LONG TERM GOAL #3   Title Patient to decerease TUG time to ,13.5 sec w/o AD    Baseline 18 sec TUG average of 2 trials    Time 8    Period Weeks    Status New    Target Date 04/09/20      PT LONG TERM GOAL #4   Title Ambulate 10057fwith LRAD across outdoor surfaces under distant S    Baseline limited to in home ambulation    Time 8    Period Weeks    Status New    Target Date 04/09/20      PT LONG TERM GOAL #5   Title Return to driving as appropriate    Baseline currently not driving    Time 8    Period Weeks    Status New    Target Date 04/09/20                 Plan - 03/07/20 0843    Clinical Impression Statement focus of todays skilled session was continued gait, balance and RLE coordination and placement strategies via balance training, RLE fatigues quickly  resulting in exaggerated foot drop and requires rest breaks to reduce fall risk, R AFO aquisition process ongoing    Personal Factors and Comorbidities Age;Comorbidity 2    Comorbidities ESRD(HD T, Th, Sat), R ankle fx 2000    Examination-Participation Restrictions Community Activity    Stability/Clinical Decision Making Stable/Uncomplicated    Rehab Potential Good    PT Frequency 2x / week    PT Duration 4 weeks     PT Treatment/Interventions ADLs/Self Care Home Management;Cryotherapy;Electrical Stimulation;Ultrasound;Therapeutic exercise;Balance training;Patient/family education;Manual techniques;Dry needling;Therapeutic activities;Neuromuscular re-education;DME Instruction;Gait training;Stair training;Functional mobility training;Orthotic Fit/Training    PT Next Visit Plan monitor R knee pain and effect on gait and fall risk, contiue TKE extension training as well as balance and gait training to reduce R foot drop    PT Home Exercise Plan heel/toe raises, standing marching    Consulted and Agree with Plan of Care Patient           Patient will benefit from skilled therapeutic intervention in order to improve the following deficits and impairments:  Postural dysfunction,Decreased balance,Impaired flexibility,Abnormal gait,Decreased mobility,Difficulty walking,Decreased coordination  Visit Diagnosis: Unsteadiness on feet  Muscle weakness (generalized)     Problem List Patient Active Problem List   Diagnosis Date Noted  . Abnormality of gait 02/25/2020  . Protein-calorie malnutrition, severe 01/19/2020  . Epistaxis   . Sleep disturbance   . Slow transit constipation   . Hemorrhoids   . Chronic systolic congestive heart failure ([place])   . ESRD on dialysis ([place])   . Right hemiparesis ([place])   . Pressure injury of skin 12/20/2019  . Intraparenchymal hemorrhage of brain ([place]) 12/18/2019  . ICH (intracerebral hemorrhage) ([place]) 12/12/2019  . Viral disease 08/22/2019  . Chronic right-sided heart failure ([place]) 07/09/2019  . Supraventricular tachycardia ([place]) 07/09/2019  . Other cirrhosis of liver ([place]) 06/27/2019  . Heart failure ([place]) 02/21/2019  . Heart palpitations 08/14/2018  . Other fluid overload 07/27/2018  . Encounter for long-term (current) use of other medications 01/18/2018  . Hypothyroidism 01/18/2018  . Myalgia and myositis 01/18/2018  . Chronic nephritis 01/18/2018  . Other  interstitial pulmonary diseases with fibrosis in diseases classified elsewhere ([place]) 01/18/2018  . Other long term (current) drug therapy 01/18/2018  . Sleep apnea 01/18/2018  . Unspecified persistent mental disorders due to conditions classified elsewhere 01/18/2018  . Venous reflux 01/18/2018  . Vitamin B12 deficiency 01/18/2018  . Vitamin D deficiency 01/18/2018  . Obstructive lung disease ([place]) 12/30/2017  . Other pruritus 12/27/2017  . Eruption cyst 12/19/2017  . 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 ([place]) 11/28/2017  . Chronic cough 11/11/2017  . Pulmonary hypertension ([place]) 11/11/2017  . Pulmonary arterial hypertension ([place]) 10/07/2017  . Acute ITP ([place]) 01/05/2017  . ESRD (end stage renal disease) ([place]) 12/28/2016  . Encounter for removal of sutures 09/09/2016  . Coagulation defect, unspecified ([place]) 09/01/2016  . Underimmunization status 08/05/2016  . Hemolytic anemia ([place]) 07/26/2016  . Epistaxis, recurrent 07/26/2016  . Unspecified protein-calorie malnutrition ([place]) 07/13/2016  . Aftercare including intermittent dialysis ([place]) 07/07/2016  . Anemia in chronic kidney disease 07/07/2016  . Hypokalemia 07/07/2016  . Iron deficiency anemia, unspecified 07/07/2016  . 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 Nonrheumatic mitral (valve) prolapse 07/07/2016  . Other irritable bowel syndrome 07/07/2016  . Other secondary thrombocytopenia 07/07/2016  . Secondary hyperparathyroidism of renal origin ([place]) 07/07/2016  . Thrombocytopenia ([place]) 07/01/2016  . ARF (acute renal failure) ([place]) 06/25/2016  . Anxiety 06/25/2016  . Bilateral carpal tunnel  syndrome 10/22/2015  . Chronic gout without tophus 10/22/2015  . Chronic nonintractable headache 10/08/2015  . Fibroid uterus 01/03/2012  . H/O vitamin D deficiency 01/03/2012  . Post-menopausal 01/03/2012  . Hereditary and idiopathic peripheral neuropathy 11/19/2011  . Intestinal malabsorption 11/19/2011  .  Lichen planus 56/72/0919  . Low back pain 11/19/2011  . Diffuse spasm of esophagus 11/11/2011  . ESR raised 11/11/2011  . Postinflammatory pulmonary fibrosis ([place]) 11/11/2011  . Scleroderma ([place]) 11/16/2010  . Rheumatoid arthritis ([place]) 11/16/2010  . Raynaud's disease 11/16/2010  . Symptomatic menopausal or female climacteric states 11/16/2010    Lanice Shirts 03/08/2020, 8:49 AM  [facility] [address] [place], [place], [address] Phone: [phone]   Fax:  [phone]  Name: MARILYNE HASELEY MRN: [id number] Date of Birth: [birth year]

## 2020-03-10 ENCOUNTER — Encounter: Payer: Self-pay | Admitting: Podiatrist

## 2020-03-10 ENCOUNTER — Telehealth: Payer: Self-pay

## 2020-03-10 NOTE — Progress Notes (Signed)
Chief Complaint  Patient presents with  . Nail Problem    Rt hallux lateral border x 5 mo; 5/10 throbbing -no injury -pt denies redness/swelling/drainage -wrose with shoes Tx; vinegar soaking and gentamicin -Lt hallux medial border x 1 mo      HPI: Patient is 65 y.o. female who presents today for pain in both great toes.  She relates she has had no redness, swelling or signs of infection.  She relates she had a stroke in December of 2021 and is recovering with minimal decrease in function.    Allergies  Allergen Reactions  . Other Anaphylaxis and Other (See Comments)    Do not use polyflux membrane.  Use alternate  . Savella [Milnacipran Hcl] Palpitations and Other (See Comments)    Unknown  . Tape Rash and Other (See Comments)    Itch- unsure if it was paper or adhesive tape    Review of systems is reviewed and negative.   Physical Exam  Patient is awake, alert, and oriented x 3.  In no acute distress.    Vascular status is intact with palpable pedal pulses DP and PT bilateral and capillary refill time less than 3 seconds bilateral.  No edema or erythema noted along the hallux nails or in the feet or legs.   Neurological exam reveals epicritic and protective sensation grossly intact bilateral.   Dermatological exam reveals skin is supple and dry to bilateral feet.  No open lesions present.  Callus tissue is present on the left hallux lateral border.  Right hallux nail appears to be healed from the ap nail procedure.  A small amount of callus is also present on the lateral border as well.  Bilateral hallux nails are elongated.    Musculoskeletal exam: Musculature intact with dorsiflexion, plantarflexion, inversion, eversion. First MPJ joint range of motion mildly decreased.  Bunion deformity is present left.     Assessment:   ICD-10-CM   1. Pain in toes of both feet  M79.674    M79.675   2. Hallux abducto valgus, left  M20.12   3. Corns and callosities  L84       Plan: Pared the callus tissue with a 15 blade and also debrided bilateral hallux nails and removed any nail that appeared to be growing into the skin.   I recommended the use of emla cream on the nails when they become uncomfortable in the future.  She would also benefit from routine nail trims in the future to keep the nails from growing down into the skin.  She will be seen back prn.

## 2020-03-10 NOTE — Telephone Encounter (Signed)
Thanks for the quick response.  I will reach to the [facility] to make sure that we can have a F2F with one MD and the referral from another and respond back to you.  Thank you for your help with this matter.  Merry Proud

## 2020-03-10 NOTE — Telephone Encounter (Signed)
As I just saw her couple weeks ago, would I be able to put in an addendum to my prior note or does she need an updated face-to-face visit?  Unfortunately, we are pretty limited in regards to scheduling additional visits but I will send this to my nurse or scheduling staff to see what is available sooner if needed.  Please let me know -I am more than happy to help any way possible!   [name], NP

## 2020-03-11 ENCOUNTER — Telehealth: Payer: Self-pay | Admitting: Adult Health

## 2020-03-11 NOTE — Telephone Encounter (Signed)
According to th [facility] office, insurance requires the F2F and the rx to be from the same provider.  Merry Proud

## 2020-03-11 NOTE — Telephone Encounter (Signed)
Called pt relayed that GI has order and have tried to call 3 times. (total).  She will call.  I told her CT head WO contrast.  She will call them and has there #.

## 2020-03-11 NOTE — Telephone Encounter (Signed)
Pt called, no one has called me to schedule a CT head. Would like a call from the nurse.

## 2020-03-11 NOTE — Telephone Encounter (Signed)
No problem. Keep me updated!   Jess

## 2020-03-12 ENCOUNTER — Other Ambulatory Visit: Payer: Self-pay

## 2020-03-12 ENCOUNTER — Ambulatory Visit: Payer: Medicare Other | Admitting: Occupational Therapy

## 2020-03-12 ENCOUNTER — Encounter: Payer: Self-pay | Admitting: Occupational Therapy

## 2020-03-12 ENCOUNTER — Ambulatory Visit: Payer: Medicare Other | Attending: Family Medicine

## 2020-03-12 DIAGNOSIS — R2681 Unsteadiness on feet: Secondary | ICD-10-CM | POA: Diagnosis present

## 2020-03-12 DIAGNOSIS — M6281 Muscle weakness (generalized): Secondary | ICD-10-CM | POA: Diagnosis present

## 2020-03-12 DIAGNOSIS — R278 Other lack of coordination: Secondary | ICD-10-CM | POA: Diagnosis present

## 2020-03-12 DIAGNOSIS — R29818 Other symptoms and signs involving the nervous system: Secondary | ICD-10-CM

## 2020-03-12 DIAGNOSIS — R2689 Other abnormalities of gait and mobility: Secondary | ICD-10-CM | POA: Insufficient documentation

## 2020-03-12 NOTE — Telephone Encounter (Signed)
I called the pt and discussed message from JM, NP and physical therapist. Pt reports she recently spoke with PT and AFO brace is being put on hold right now. Pt report PT was able to locate a less cumbersome brace and pt would like to try this brace first and then decide if she would like to pursue the AFO. Pt reports new brace should be received by mail in 1-2 days.   Pt was advised if she decides to pursue AFO brace to call our office back and we could assist with scheduling F2F visit.  Pt verbalized understanding and was agreeable to plan.

## 2020-03-12 NOTE — Therapy (Signed)
[facility] [address] [place], [place], [address] Phone: [phone]   Fax:  [phone]  Physical Therapy Treatment  Patient Details  Name: Katie Nunez MRN: [id number] Date of Birth: 18-Mar-1955 Referring Provider (PT): [name] MD   Encounter Date: 03/12/2020   PT End of Session - 03/12/20 1602    Visit Number 8    Number of Visits 12    Date for PT Re-Evaluation 04/13/20    Authorization Type UHC-MC    PT Start Time 1455    PT Stop Time 1540    PT Time Calculation (min) 45 min    Equipment Utilized During Treatment Gait belt    Activity Tolerance Patient tolerated treatment well    Behavior During Therapy [facility] for tasks assessed/performed           Past Medical History:  Diagnosis Date  . Achalasia   . Anxiety   . Dysplasia of cervix, low grade (CIN 1)   . Environmental allergies    "all year long" (12/27/2016)  . ESRD (end stage renal disease) on dialysis [facility])    "TTS; [place]" (12/27/2016)  . Fibromyalgia   . GERD (gastroesophageal reflux disease)   . Gout   . Hypertension   . IBS (irritable bowel syndrome)   . MVP (mitral valve prolapse)   . RA (rheumatoid arthritis) (HCC)    FOLLOWED BY DR. SHANAHAN  . Raynaud's disease   . Scleroderma ([place])   . Seasonal allergies   . Thrombocytopenia ([place]) 07/01/2016   Acute fall to 13,000 07/01/16  . Tubular adenoma 01/08/2008   CECUM  . Vitamin D deficiency     Past Surgical History:  Procedure Laterality Date  . ANKLE FRACTURE SURGERY Right   . AV FISTULA PLACEMENT Left 06/28/2016   Procedure: left arm ARTERIOVENOUS (AV) FISTULA CREATION;  Surgeon: Rosetta Posner, MD;  Location: [place];  Service: Vascular;  Laterality: Left;  . BASCILIC VEIN TRANSPOSITION Left 09/27/2016   Procedure: LEFT UPPER ARM CEPHALIC VEIN TRANSPOSITION;  Surgeon: Rosetta Posner, MD;  Location: [place];  Service: Vascular;  Laterality: Left;  . BREAST BIOPSY     "?  side"  . CESAREAN SECTION  1994  . CO2 LASER OF CERVIX    . COLONOSCOPY W/ BIOPSIES  01/08/2008  . INSERTION OF DIALYSIS CATHETER Right 06/28/2016   Procedure: INSERTION OF DIALYSIS CATHETER, right internal jugular;  Surgeon: Rosetta Posner, MD;  Location: [place];  Service: Vascular;  Laterality: Right;  . MYOMECTOMY    . NASAL ENDOSCOPY WITH EPISTAXIS CONTROL N/A 12/29/2019   Procedure: NASAL ENDOSCOPY WITH EPISTAXIS CONTROL;  Surgeon: Leta Baptist, MD;  Location: [facility] OR;  Service: ENT;  Laterality: N/A;  . PELVIC LAPAROSCOPY  2011  . superficial thrombophlebitis Left 07-2014    There were no vitals filed for this visit.   Subjective Assessment - 03/12/20 1559    Subjective continues to report good days and bad days related to energy level and overall sterngth, reporting no significant overall decline however.  Continues to note vague R peripatellar symptoms desribed as "unstable"    Patient is accompained by: Family member    Pertinent History Scleroderma, RA, pulmonary HTN, kidney disease (dialysis) Tu,TH and Sat, Rt ankle fx/dislocation/surgery 2000    Limitations Walking    Patient Stated Goals strengthen ankle and knee R, get off of cane    Currently in Pain? No/denies    Pain Score 0-No pain  Pain Onset More than a month ago                             [facility] Adult PT Treatment/Exercise - 03/12/20 0001      Ambulation/Gait   Ambulation/Gait Yes    Ambulation/Gait Assistance 5: Supervision;4: Min guard    Ambulation/Gait Assistance Details trial of foot up brace    Ambulation Distance (Feet) 230 Feet    Assistive device Straight cane    Gait Pattern Decreased arm swing - right;Decreased step length - right;Right circumduction    Ambulation Surface Level;Indoor      Knee/Hip Exercises: Aerobic   Nustep L1 x 6'               Balance Exercises - 03/12/20 0001      Balance Exercises: Standing   Sidestepping Upper extremity support;5  reps;Theraband;Limitations    Theraband Level (Sidestepping) Level 3 (Green)    Sidestepping Limitations 5 trips at counter    Heel Raises Both;10 reps;Limitations    Heel Raises Limitations 2x10 countertop support    Toe Raise Both;10 reps;Limitations    Toe Raise Limitations countertop support 2x10             PT Education - 03/12/20 1602    Education Details updated HEP [id number]    Person(s) Educated Patient    Methods Explanation;Demonstration;Handout    Comprehension Verbalized understanding;Returned demonstration            PT Short Term Goals - 03/12/20 1612      PT SHORT TERM GOAL #1   Title Ind with initial HEP and able to demo back to PT w/o need of VCs    Baseline no HEP; 03/12/20 demos HEP w/o need of VCs    Time 2    Period Weeks    Status Achieved    Target Date 02/27/20      PT SHORT TERM GOAL #2   Title Patient to obtain [facility] and bring it to PT sessions for adjustment and training    Baseline not using cane prior; 03/12/20 Has been using cane in clinic, adjusted to her body habitus    Time 2    Period Weeks    Status Achieved    Target Date 02/27/20             PT Long Term Goals - 02/13/20 1520      PT LONG TERM GOAL #1   Title Patient to improve 5x STS score to < 20 sec    Baseline 24 seconds    Time 8    Period Weeks    Status New    Target Date 04/09/20      PT LONG TERM GOAL #2   Title Patient to demo 4+/5 R ankle/knee strength throughout    Baseline 4/5 R ankle knee strength    Time 8    Period Weeks    Status New    Target Date 04/09/20      PT LONG TERM GOAL #3   Title Patient to decerease TUG time to ,13.5 sec w/o AD    Baseline 18 sec TUG average of 2 trials    Time 8    Period Weeks    Status New    Target Date 04/09/20      PT LONG TERM GOAL #4   Title Ambulate 1026f with LRAD across outdoor surfaces under distant S    Baseline  limited to in home ambulation    Time 8    Period Weeks    Status New    Target Date  04/09/20      PT LONG TERM GOAL #5   Title Return to driving as appropriate    Baseline currently not driving    Time 8    Period Weeks    Status New    Target Date 04/09/20                 Plan - 03/12/20 1603    Clinical Impression Statement Todays skilled session addressed BLE strengthening targeting hip abductors and extensors, ankle PF/DF and R patellofemoral concerns, trial of taping was helpful using medial glide, updated HEP and performed trial of Foot Up brace which provided some improvement in DF and gait pattern    Personal Factors and Comorbidities Age;Comorbidity 2    Comorbidities ESRD(HD T, Th, Sat), R ankle fx 2000    Examination-Participation Restrictions Community Activity    Stability/Clinical Decision Making Stable/Uncomplicated    Rehab Potential Good    PT Frequency 2x / week    PT Duration 4 weeks    PT Treatment/Interventions ADLs/Self Care Home Management;Cryotherapy;Electrical Stimulation;Ultrasound;Therapeutic exercise;Balance training;Patient/family education;Manual techniques;Dry needling;Therapeutic activities;Neuromuscular re-education;DME Instruction;Gait training;Stair training;Functional mobility training;Orthotic Fit/Training    PT Next Visit Plan continue to address RLE strength and control deficits, review updated HEP, ask about foot up brace which she is considering purchasing online    PT Home Exercise Plan heel/toe raises, sidestepping against green band, retrowalikng    Consulted and Agree with Plan of Care Patient           Patient will benefit from skilled therapeutic intervention in order to improve the following deficits and impairments:  Postural dysfunction,Decreased balance,Impaired flexibility,Abnormal gait,Decreased mobility,Difficulty walking,Decreased coordination  Visit Diagnosis: Unsteadiness on feet  Muscle weakness (generalized)     Problem List Patient Active Problem List   Diagnosis Date Noted  . Abnormality  of gait 02/25/2020  . Protein-calorie malnutrition, severe 01/19/2020  . Epistaxis   . Sleep disturbance   . Slow transit constipation   . Hemorrhoids   . Chronic systolic congestive heart failure ([place])   . ESRD on dialysis ([place])   . Right hemiparesis ([place])   . Pressure injury of skin 12/20/2019  . Intraparenchymal hemorrhage of brain ([place]) 12/18/2019  . ICH (intracerebral hemorrhage) ([place]) 12/12/2019  . Viral disease 08/22/2019  . Chronic right-sided heart failure ([place]) 07/09/2019  . Supraventricular tachycardia ([place]) 07/09/2019  . Other cirrhosis of liver ([place]) 06/27/2019  . Heart failure ([place]) 02/21/2019  . Heart palpitations 08/14/2018  . Other fluid overload 07/27/2018  . Encounter for long-term (current) use of other medications 01/18/2018  . Hypothyroidism 01/18/2018  . Myalgia and myositis 01/18/2018  . Chronic nephritis 01/18/2018  . Other interstitial pulmonary diseases with fibrosis in diseases classified elsewhere ([place]) 01/18/2018  . Other long term (current) drug therapy 01/18/2018  . Sleep apnea 01/18/2018  . Unspecified persistent mental disorders due to conditions classified elsewhere 01/18/2018  . Venous reflux 01/18/2018  . Vitamin B12 deficiency 01/18/2018  . Vitamin D deficiency 01/18/2018  . Obstructive lung disease ([place]) 12/30/2017  . Other pruritus 12/27/2017  . Eruption cyst 12/19/2017  . 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 ([place]) 11/28/2017  . Chronic cough 11/11/2017  . Pulmonary hypertension ([place]) 11/11/2017  . Pulmonary arterial hypertension ([place]) 10/07/2017  . Acute ITP ([place]) 01/05/2017  . ESRD (end stage renal disease) ([place]) 12/28/2016  .  Encounter for removal of sutures 09/09/2016  . Coagulation defect, unspecified ([place]) 09/01/2016  . Underimmunization status 08/05/2016  . Hemolytic anemia ([place]) 07/26/2016  . Epistaxis, recurrent 07/26/2016  . Unspecified protein-calorie malnutrition ([place]) 07/13/2016  .  Aftercare including intermittent dialysis ([place]) 07/07/2016  . Anemia in chronic kidney disease 07/07/2016  . Hypokalemia 07/07/2016  . Iron deficiency anemia, unspecified 07/07/2016  . 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 Nonrheumatic mitral (valve) prolapse 07/07/2016  . Other irritable bowel syndrome 07/07/2016  . Other secondary thrombocytopenia 07/07/2016  . Secondary hyperparathyroidism of renal origin ([place]) 07/07/2016  . Thrombocytopenia ([place]) 07/01/2016  . ARF (acute renal failure) ([place]) 06/25/2016  . Anxiety 06/25/2016  . Bilateral carpal tunnel syndrome 10/22/2015  . Chronic gout without tophus 10/22/2015  . Chronic nonintractable headache 10/08/2015  . Fibroid uterus 01/03/2012  . H/O vitamin D deficiency 01/03/2012  . Post-menopausal 01/03/2012  . Hereditary and idiopathic peripheral neuropathy 11/19/2011  . Intestinal malabsorption 11/19/2011  . Lichen planus 18/29/9371  . Low back pain 11/19/2011  . Diffuse spasm of esophagus 11/11/2011  . ESR raised 11/11/2011  . Postinflammatory pulmonary fibrosis ([place]) 11/11/2011  . Scleroderma ([place]) 11/16/2010  . Rheumatoid arthritis ([place]) 11/16/2010  . Raynaud's disease 11/16/2010  . Symptomatic menopausal or female climacteric states 11/16/2010    Lanice Shirts PT 03/12/2020, 5:10 PM  [facility] [address] [place], [place], [address] Phone: [phone]   Fax:  [phone]  Name: HARRIS KISTLER MRN: [id number] Date of Birth: 20-Jan-1955

## 2020-03-12 NOTE — Therapy (Signed)
[facility] [address] [place], [place], [address] Phone: [phone]   Fax:  [phone]  Occupational Therapy Treatment  Patient Details  Name: Katie Nunez MRN: [id number] Date of Birth: 06-25-1955 Referring Provider (OT): Molli Barrows   Encounter Date: 03/12/2020   OT End of Session - 03/12/20 1505    Visit Number 8    Number of Visits 17    Date for OT Re-Evaluation 04/09/20    Authorization Type UHC Medicare    Authorization Time Period 90 day cert    Authorization - Visit Number 8    Progress Note Due on Visit 10    OT Start Time 1413    OT Stop Time 1445    OT Time Calculation (min) 32 min    Activity Tolerance Patient tolerated treatment well    Behavior During Therapy [facility] for tasks assessed/performed           Past Medical History:  Diagnosis Date  . Achalasia   . Anxiety   . Dysplasia of cervix, low grade (CIN 1)   . Environmental allergies    "all year long" (12/27/2016)  . ESRD (end stage renal disease) on dialysis [facility])    "TTS; [place]" (12/27/2016)  . Fibromyalgia   . GERD (gastroesophageal reflux disease)   . Gout   . Hypertension   . IBS (irritable bowel syndrome)   . MVP (mitral valve prolapse)   . RA (rheumatoid arthritis) (HCC)    FOLLOWED BY DR. SHANAHAN  . Raynaud's disease   . Scleroderma ([place])   . Seasonal allergies   . Thrombocytopenia ([place]) 07/01/2016   Acute fall to 13,000 07/01/16  . Tubular adenoma 01/08/2008   CECUM  . Vitamin D deficiency     Past Surgical History:  Procedure Laterality Date  . ANKLE FRACTURE SURGERY Right   . AV FISTULA PLACEMENT Left 06/28/2016   Procedure: left arm ARTERIOVENOUS (AV) FISTULA CREATION;  Surgeon: Rosetta Posner, MD;  Location: [place];  Service: Vascular;  Laterality: Left;  . BASCILIC VEIN TRANSPOSITION Left 09/27/2016   Procedure: LEFT UPPER ARM CEPHALIC VEIN TRANSPOSITION;  Surgeon: Rosetta Posner, MD;  Location: [facility];   Service: Vascular;  Laterality: Left;  . BREAST BIOPSY     "? side"  . CESAREAN SECTION  1994  . CO2 LASER OF CERVIX    . COLONOSCOPY W/ BIOPSIES  01/08/2008  . INSERTION OF DIALYSIS CATHETER Right 06/28/2016   Procedure: INSERTION OF DIALYSIS CATHETER, right internal jugular;  Surgeon: Rosetta Posner, MD;  Location: [place];  Service: Vascular;  Laterality: Right;  . MYOMECTOMY    . NASAL ENDOSCOPY WITH EPISTAXIS CONTROL N/A 12/29/2019   Procedure: NASAL ENDOSCOPY WITH EPISTAXIS CONTROL;  Surgeon: Leta Baptist, MD;  Location: [facility] OR;  Service: ENT;  Laterality: N/A;  . PELVIC LAPAROSCOPY  2011  . superficial thrombophlebitis Left 07-2014    There were no vitals filed for this visit.   Subjective Assessment - 03/12/20 1415    Subjective  Pt reports she been busy today    Pertinent History Scleroderma, RA,HTN, ESRD (dialysis), hx R ankle Fx, 2000, anxiety,fibromyalgia, gout, HTN, MVP, Raynaud's  disease, C section, ostructive lung disease, B CTS, hereditary and periferal neuropathy, venous reflux, ICH    Patient Stated Goals write and use RUE better, drive agin    Currently in Pain? Yes    Pain Score 3     Pain Location Shoulder    Pain  Orientation Right    Pain Descriptors / Indicators Aching    Pain Type Acute pain    Pain Onset More than a month ago    Pain Frequency Intermittent    Aggravating Factors  reaching    Pain Relieving Factors rest                  Treatment:Supine closed chain chest press and shoulder flexion, cues and facilitation to maintain scapular retraction. Pt practiced opening containers with bilateral UE's min v.c Discussion regarding safe low range use of RUE Discussed saebo vs. TENS unit and that pt will no likely need Saebo even though pt has ordered.              OT Education - 03/12/20 1519    Education Details supine closed chain shoulder flexion to grossly eye level with cues to maintain scapualr retraction.    Person(s) Educated  Patient    Methods Explanation;Demonstration;Handout    Comprehension Verbalized understanding;Returned demonstration;Verbal cues required            OT Short Term Goals - 03/12/20 1435      OT SHORT TERM GOAL #1   Title I with inital HEP.    Time 4    Period Weeks    Status On-going      OT SHORT TERM GOAL #2   Title Pt will demonstrate improved fine motor coordination for ADLs as evidenced by decreasing 9 hole peg test score for RUE to 52 secs or less    Baseline RUE60.56, LUE 32.81    Time 4    Period Weeks    Status On-going      OT SHORT TERM GOAL #3   Title Pt will perform light home management/simple cooking tasks with supervision and min v.c    Time 4    Period Weeks    Status On-going      OT SHORT TERM GOAL #4   Title Pt will demonstrate improved RUE functional use as eveidenced by increasing box/ blocks score to 25 blocks with RUE.    Time 4    Period Weeks    Status On-going      OT SHORT TERM GOAL #5   Title Pt will write a short paragraph with at least 90% legibility.    Time 4    Period Weeks    Status New             OT Long Term Goals - 02/13/20 1550      OT LONG TERM GOAL #1   Title I with updated HEP.-04/09/20    Time 8    Period Weeks    Status New    Target Date 04/09/20      OT LONG TERM GOAL #2   Title Pt will perform basic home managment/ cooking modified Independently    Time 8    Period Weeks    Status New      OT LONG TERM GOAL #3   Title Pt will demonstrate improved fine motor coordination for ADLs as evidenced by decreasing 9 hole peg test score for RUE to 45 secs or less.    Time 8    Period Weeks    Status New      OT LONG TERM GOAL #4   Title Pt will write a short paragraph with 100% legibility.    Time 8    Period Weeks    Status New      OT  LONG TERM GOAL #5   Title Pt will demonstrate ability to retrieve a lightweight object at 125 shoulder flexion with RUE demonstrating good control.    Time 8    Period  Weeks    Status New                 Plan - 03/12/20 1507    Clinical Impression Statement Pt continues to have some tightness in RUE shoulder, however her pain is better overall. Pt reports she has oreder a Physiological scientist for her shoulder. Therapist does not feel that he Rowe Robert is necessary at this time. Pt may find a TENS unit more helpful for shoulder pain prn, as weel as scapualr strengthening.    OT Occupational Profile and History Detailed Assessment- Review of Records and additional review of physical, cognitive, psychosocial history related to current functional performance    Occupational performance deficits (Please refer to evaluation for details): ADL's;IADL's;Leisure;Rest and Sleep;Social Participation;Other    Body Structure / Function / Physical Skills ADL;Endurance;Decreased knowledge of precautions;Decreased knowledge of use of DME;Flexibility;IADL;Pain;Skin integrity;Edema;Mobility;ROM    Rehab Potential Good    OT Frequency 2x / week    OT Duration 8 weeks    OT Treatment/Interventions Self-care/ADL training;Therapeutic exercise;DME and/or AE instruction;Therapist, nutritional;Compression bandaging;Other (comment);Manual Therapy;Scar mobilization;Coping strategies training;Energy conservation;Manual lymph drainage;Passive range of motion;Therapeutic activities    Plan review shoulder flexion in supine cane, pt requires cues to maintain scapular retractio with exercise, add to HEP prn , handwriting    Consulted and Agree with Plan of Care Patient           Patient will benefit from skilled therapeutic intervention in order to improve the following deficits and impairments:   Body Structure / Function / Physical Skills: ADL,Endurance,Decreased knowledge of precautions,Decreased knowledge of use of DME,Flexibility,IADL,Pain,Skin integrity,Edema,Mobility,ROM       Visit Diagnosis: Muscle weakness (generalized)  Other lack of coordination  Other symptoms and signs  involving the nervous system    Problem List Patient Active Problem List   Diagnosis Date Noted  . Abnormality of gait 02/25/2020  . Protein-calorie malnutrition, severe 01/19/2020  . Epistaxis   . Sleep disturbance   . Slow transit constipation   . Hemorrhoids   . Chronic systolic congestive heart failure ([place])   . ESRD on dialysis ([place])   . Right hemiparesis ([place])   . Pressure injury of skin 12/20/2019  . Intraparenchymal hemorrhage of brain ([place]) 12/18/2019  . ICH (intracerebral hemorrhage) ([place]) 12/12/2019  . Viral disease 08/22/2019  . Chronic right-sided heart failure ([place]) 07/09/2019  . Supraventricular tachycardia ([place]) 07/09/2019  . Other cirrhosis of liver ([place]) 06/27/2019  . Heart failure ([place]) 02/21/2019  . Heart palpitations 08/14/2018  . Other fluid overload 07/27/2018  . Encounter for long-term (current) use of other medications 01/18/2018  . Hypothyroidism 01/18/2018  . Myalgia and myositis 01/18/2018  . Chronic nephritis 01/18/2018  . Other interstitial pulmonary diseases with fibrosis in diseases classified elsewhere ([place]) 01/18/2018  . Other long term (current) drug therapy 01/18/2018  . Sleep apnea 01/18/2018  . Unspecified persistent mental disorders due to conditions classified elsewhere 01/18/2018  . Venous reflux 01/18/2018  . Vitamin B12 deficiency 01/18/2018  . Vitamin D deficiency 01/18/2018  . Obstructive lung disease ([place]) 12/30/2017  . Other pruritus 12/27/2017  . Eruption cyst 12/19/2017  . 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 Chronic cough 11/11/2017  . Pulmonary hypertension ([place]) 11/11/2017  . Pulmonary arterial hypertension ([place]) 10/07/2017  .  Acute ITP ([place]) 01/05/2017  . ESRD (end stage renal disease) ([place]) 12/28/2016  . Encounter for removal of sutures 09/09/2016  . Coagulation defect, unspecified ([place]) 09/01/2016  . Underimmunization status 08/05/2016  . Hemolytic anemia  ([place]) 07/26/2016  . Epistaxis, recurrent 07/26/2016  . Unspecified protein-calorie malnutrition ([place]) 07/13/2016  . Aftercare including intermittent dialysis ([place]) 07/07/2016  . Anemia in chronic kidney disease 07/07/2016  . Hypokalemia 07/07/2016  . Iron deficiency anemia, unspecified 07/07/2016  . 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 Nonrheumatic mitral (valve) prolapse 07/07/2016  . Other irritable bowel syndrome 07/07/2016  . Other secondary thrombocytopenia 07/07/2016  . Secondary hyperparathyroidism of renal origin ([place]) 07/07/2016  . Thrombocytopenia ([place]) 07/01/2016  . ARF (acute renal failure) ([place]) 06/25/2016  . Anxiety 06/25/2016  . Bilateral carpal tunnel syndrome 10/22/2015  . Chronic gout without tophus 10/22/2015  . Chronic nonintractable headache 10/08/2015  . Fibroid uterus 01/03/2012  . H/O vitamin D deficiency 01/03/2012  . Post-menopausal 01/03/2012  . Hereditary and idiopathic peripheral neuropathy 11/19/2011  . Intestinal malabsorption 11/19/2011  . Lichen planus 83/01/4157  . Low back pain 11/19/2011  . Diffuse spasm of esophagus 11/11/2011  . ESR raised 11/11/2011  . Postinflammatory pulmonary fibrosis ([place]) 11/11/2011  . Scleroderma ([place]) 11/16/2010  . Rheumatoid arthritis ([place]) 11/16/2010  . Raynaud's disease 11/16/2010  . Symptomatic menopausal or female climacteric states 11/16/2010    RINE,KATHRYN 03/12/2020, 3:19 PM  [facility] [address] [place], [place], [address] Phone: [phone]   Fax:  [phone]  Name: Katie Nunez MRN: [id number] Date of Birth: 1955/03/07

## 2020-03-12 NOTE — Patient Instructions (Signed)
Access Code: [email or web address] URL: [email or web address] Date: 03/12/2020 Prepared by: Sharlynn Oliphant  Exercises Sit to Stand - 2 x daily - 7 x weekly - 10 reps Heel Toe Raises with Counter Support - 2 x daily - 7 x weekly - 3 sets - 10 reps Side Stepping with Resistance at Thighs - 2 x daily - 7 x weekly - 3 sets - 10 reps Backwards Walking - 2 x daily - 7 x weekly - 3 sets - 10 reps

## 2020-03-12 NOTE — Telephone Encounter (Signed)
Sandy,   Can you please reach out to patient to schedule sooner f/u visit for F2F for AFO brace? Thank you!

## 2020-03-12 NOTE — Patient Instructions (Signed)
Cane Overhead - Supine  Hold cane at thighs with both hands, extend arms straight over head. Squeeze shoulder blade back against bed stop at eye level or less. Do not go pass pain. Hold 5 seconds. Repeat 10 times. Do 1-2 times per day.

## 2020-03-12 NOTE — Telephone Encounter (Signed)
Megan, thank you for calling patient!  Merry Proud, please let me know if she wishes to pursue AFO brace and we will try to get her in quickly for a F2F visit!

## 2020-03-14 ENCOUNTER — Other Ambulatory Visit: Payer: Self-pay

## 2020-03-14 ENCOUNTER — Ambulatory Visit: Payer: Medicare Other | Admitting: Occupational Therapy

## 2020-03-14 ENCOUNTER — Ambulatory Visit: Payer: Medicare Other

## 2020-03-14 ENCOUNTER — Encounter: Payer: Self-pay | Admitting: Occupational Therapy

## 2020-03-14 DIAGNOSIS — R2689 Other abnormalities of gait and mobility: Secondary | ICD-10-CM

## 2020-03-14 DIAGNOSIS — R2681 Unsteadiness on feet: Secondary | ICD-10-CM

## 2020-03-14 DIAGNOSIS — M6281 Muscle weakness (generalized): Secondary | ICD-10-CM

## 2020-03-14 DIAGNOSIS — R278 Other lack of coordination: Secondary | ICD-10-CM

## 2020-03-14 NOTE — Therapy (Signed)
[facility] [address] [place], [place], [address] Phone: [phone]   Fax:  [phone]  Occupational Therapy Treatment  Patient Details  Name: Katie Nunez MRN: [id number] Date of Birth: 1955-02-14 Referring Provider (OT): Molli Barrows   Encounter Date: 03/14/2020   OT End of Session - 03/14/20 1403    Visit Number 9    Number of Visits 17    Date for OT Re-Evaluation 04/09/20    Authorization Type [employer] Medicare    Authorization Time Period 90 day cert    Authorization - Visit Number 9    Progress Note Due on Visit 10    OT Start Time 1402    OT Stop Time 1445    OT Time Calculation (min) 43 min    Activity Tolerance Patient tolerated treatment well    Behavior During Therapy [facility] for tasks assessed/performed           Past Medical History:  Diagnosis Date  . Achalasia   . Anxiety   . Dysplasia of cervix, low grade (CIN 1)   . Environmental allergies    "all year long" (12/27/2016)  . ESRD (end stage renal disease) on dialysis [facility])    "TTS; [place]" (12/27/2016)  . Fibromyalgia   . GERD (gastroesophageal reflux disease)   . Gout   . Hypertension   . IBS (irritable bowel syndrome)   . MVP (mitral valve prolapse)   . RA (rheumatoid arthritis) (HCC)    FOLLOWED BY DR. SHANAHAN  . Raynaud's disease   . Scleroderma ([place])   . Seasonal allergies   . Thrombocytopenia ([place]) 07/01/2016   Acute fall to 13,000 07/01/16  . Tubular adenoma 01/08/2008   CECUM  . Vitamin D deficiency     Past Surgical History:  Procedure Laterality Date  . ANKLE FRACTURE SURGERY Right   . AV FISTULA PLACEMENT Left 06/28/2016   Procedure: left arm ARTERIOVENOUS (AV) FISTULA CREATION;  Surgeon: Rosetta Posner, MD;  Location: [place];  Service: Vascular;  Laterality: Left;  . BASCILIC VEIN TRANSPOSITION Left 09/27/2016   Procedure: LEFT UPPER ARM CEPHALIC VEIN TRANSPOSITION;  Surgeon: Rosetta Posner, MD;  Location: [place];   Service: Vascular;  Laterality: Left;  . BREAST BIOPSY     "? side"  . CESAREAN SECTION  1994  . CO2 LASER OF CERVIX    . COLONOSCOPY W/ BIOPSIES  01/08/2008  . INSERTION OF DIALYSIS CATHETER Right 06/28/2016   Procedure: INSERTION OF DIALYSIS CATHETER, right internal jugular;  Surgeon: Rosetta Posner, MD;  Location: [facility];  Service: Vascular;  Laterality: Right;  . MYOMECTOMY    . NASAL ENDOSCOPY WITH EPISTAXIS CONTROL N/A 12/29/2019   Procedure: NASAL ENDOSCOPY WITH EPISTAXIS CONTROL;  Surgeon: Leta Baptist, MD;  Location[facility];  Service: ENT;  Laterality: N/A;  . PELVIC LAPAROSCOPY  2011  . superficial thrombophlebitis Left 07-2014    There were no vitals filed for this visit.   Subjective Assessment - 03/14/20 1404    Subjective  "at the end of the day my shoulder is in full blown"    Pertinent History Scleroderma, RA,HTN, ESRD (dialysis), hx R ankle Fx, 2000, anxiety,fibromyalgia, gout, HTN, MVP, Raynaud's  disease, C section, ostructive lung disease, B CTS, hereditary and periferal neuropathy, venous reflux, ICH    Patient Stated Goals write and use RUE better, drive agin    Currently in Pain? Yes    Pain Score 5     Pain  Location Shoulder    Pain Orientation Right    Pain Descriptors / Indicators Tightness    Pain Type Acute pain    Pain Onset More than a month ago    Pain Frequency Intermittent                        OT Treatments/Exercises (OP) - 03/14/20 1423      Neurological Re-education Exercises   Other Information worked on scap stabilization at table with weight bearing and modified push ups into table x 10 with mod facilitation. scapular retraction x 10 sitting at table      Functional Reaching Activities   Mid Level placing 1 inch blocks on 10" surface for elevated reach and mod verbal and visual cues for looking up and sitting up tall for increase in postural upright sitting and decrease in overall pain d/t forward rounded shoulders. Use of mirror  for encouraging looking up and increased upright sitting posture      Fine Motor Coordination (Hand/Wrist)   Fine Motor Coordination Large Pegboard    Large Pegboard medium pegs with RUE with ogod cooridnation but required increased time. Mod encouragment and cues for sitting up tall and looking up at pattern sitting at elevated surface.                    OT Short Term Goals - 03/14/20 1407      OT SHORT TERM GOAL #1   Title I with inital HEP.    Time 4    Period Weeks    Status On-going      OT SHORT TERM GOAL #2   Title Pt will demonstrate improved fine motor coordination for ADLs as evidenced by decreasing 9 hole peg test score for RUE to 52 secs or less    Baseline RUE 60.56, LUE 32.81    Time 4    Period Weeks    Status Achieved   RUE 43.84s     OT SHORT TERM GOAL #3   Title Pt will perform light home management/simple cooking tasks with supervision and min v.c    Time 4    Period Weeks    Status On-going      OT SHORT TERM GOAL #4   Title Pt will demonstrate improved RUE functional use as eveidenced by increasing box/ blocks score to 25 blocks with RUE.    Time 4    Period Weeks    Status Achieved   RUE 30 blocks on 03/14/2020     OT SHORT TERM GOAL #5   Title Pt will write a short paragraph with at least 90% legibility.    Time 4    Period Weeks    Status On-going             OT Long Term Goals - 03/14/20 1416      OT LONG TERM GOAL #1   Title I with updated HEP.-04/09/20    Time 8    Period Weeks    Status New      OT LONG TERM GOAL #2   Title Pt will perform basic home managment/ cooking modified Independently    Time 8    Period Weeks    Status New      OT LONG TERM GOAL #3   Title Pt will demonstrate improved fine motor coordination for ADLs as evidenced by decreasing 9 hole peg test score for RUE to 45 secs or less.  Time 8    Period Weeks    Status Achieved   43.84s RUE on 03/14/20     OT LONG TERM GOAL #4   Title Pt will write a  short paragraph with 100% legibility.    Time 8    Period Weeks    Status New      OT LONG TERM GOAL #5   Title Pt will demonstrate ability to retrieve a lightweight object at 125 shoulder flexion with RUE demonstrating good control.    Time 8    Period Weeks    Status New                 Plan - 03/14/20 1442    Clinical Impression Statement Pt continues to have tightness in RUE shoulder possibly d/t rounded shoulders and posture. Pt encouraged to sit up tall and increase overall postural alignment in sitting.    OT Occupational Profile and History Detailed Assessment- Review of Records and additional review of physical, cognitive, psychosocial history related to current functional performance    Occupational performance deficits (Please refer to evaluation for details): ADL's;IADL's;Leisure;Rest and Sleep;Social Participation;Other    Body Structure / Function / Physical Skills ADL;Endurance;Decreased knowledge of precautions;Decreased knowledge of use of DME;Flexibility;IADL;Pain;Skin integrity;Edema;Mobility;ROM    Rehab Potential Good    OT Frequency 2x / week    OT Duration 8 weeks    OT Treatment/Interventions Self-care/ADL training;Therapeutic exercise;DME and/or AE instruction;Therapist, nutritional;Compression bandaging;Other (comment);Manual Therapy;Scar mobilization;Coping strategies training;Energy conservation;Manual lymph drainage;Passive range of motion;Therapeutic activities    Plan review shoulder flexion in supine cane, pt requires cues to maintain scapular retraction with exercise, add to HEP prn , handwriting    Consulted and Agree with Plan of Care Patient           Patient will benefit from skilled therapeutic intervention in order to improve the following deficits and impairments:   Body Structure / Function / Physical Skills: ADL,Endurance,Decreased knowledge of precautions,Decreased knowledge of use of DME,Flexibility,IADL,Pain,Skin  integrity,Edema,Mobility,ROM       Visit Diagnosis: Muscle weakness (generalized)  Other lack of coordination  Unsteadiness on feet  Other abnormalities of gait and mobility    Problem List Patient Active Problem List   Diagnosis Date Noted  . Abnormality of gait 02/25/2020  . Protein-calorie malnutrition, severe 01/19/2020  . Epistaxis   . Sleep disturbance   . Slow transit constipation   . Hemorrhoids   . Chronic systolic congestive heart failure ([place])   . ESRD on dialysis ([place])   . Right hemiparesis ([place])   . Pressure injury of skin 12/20/2019  . Intraparenchymal hemorrhage of brain ([place]) 12/18/2019  . ICH (intracerebral hemorrhage) ([place]) 12/12/2019  . Viral disease 08/22/2019  . Chronic right-sided heart failure ([place]) 07/09/2019  . Supraventricular tachycardia ([place]) 07/09/2019  . Other cirrhosis of liver ([place]) 06/27/2019  . Heart failure ([place]) 02/21/2019  . Heart palpitations 08/14/2018  . Other fluid overload 07/27/2018  . Encounter for long-term (current) use of other medications 01/18/2018  . Hypothyroidism 01/18/2018  . Myalgia and myositis 01/18/2018  . Chronic nephritis 01/18/2018  . Other interstitial pulmonary diseases with fibrosis in diseases classified elsewhere ([place]) 01/18/2018  . Other long term (current) drug therapy 01/18/2018  . Sleep apnea 01/18/2018  . Unspecified persistent mental disorders due to conditions classified elsewhere 01/18/2018  . Venous reflux 01/18/2018  . Vitamin B12 deficiency 01/18/2018  . Vitamin D deficiency 01/18/2018  . Obstructive lung disease ([place]) 12/30/2017  . Other pruritus 12/27/2017  .  Eruption cyst 12/19/2017  . 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 ([place]) 11/28/2017  . Chronic cough 11/11/2017  . Pulmonary hypertension ([place]) 11/11/2017  . Pulmonary arterial hypertension ([place]) 10/07/2017  . Acute ITP ([place]) 01/05/2017  . ESRD (end stage renal disease) ([place]) 12/28/2016  .  Encounter for removal of sutures 09/09/2016  . Coagulation defect, unspecified ([place]) 09/01/2016  . Underimmunization status 08/05/2016  . Hemolytic anemia ([place]) 07/26/2016  . Epistaxis, recurrent 07/26/2016  . Unspecified protein-calorie malnutrition ([place]) 07/13/2016  . Aftercare including intermittent dialysis ([place]) 07/07/2016  . Anemia in chronic kidney disease 07/07/2016  . Hypokalemia 07/07/2016  . Iron deficiency anemia, unspecified 07/07/2016  . 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 Nonrheumatic mitral (valve) prolapse 07/07/2016  . Other irritable bowel syndrome 07/07/2016  . Other secondary thrombocytopenia 07/07/2016  . Secondary hyperparathyroidism of renal origin ([place] [address]) 07/07/2016  . Thrombocytopenia ([place]) 07/01/2016  . ARF (acute renal failure) ([place]) 06/25/2016  . Anxiety 06/25/2016  . Bilateral carpal tunnel syndrome 10/22/2015  . Chronic gout without tophus 10/22/2015  . Chronic nonintractable headache 10/08/2015  . Fibroid uterus 01/03/2012  . H/O vitamin D deficiency 01/03/2012  . Post-menopausal 01/03/2012  . Hereditary and idiopathic peripheral neuropathy 11/19/2011  . Intestinal malabsorption 11/19/2011  . Lichen planus 27/06/2374  . Low back pain 11/19/2011  . Diffuse spasm of esophagus 11/11/2011  . ESR raised 11/11/2011  . Postinflammatory pulmonary fibrosis ([place]) 11/11/2011  . Scleroderma ([place]) 11/16/2010  . Rheumatoid arthritis ([place]) 11/16/2010  . Raynaud's disease 11/16/2010  . Symptomatic menopausal or female climacteric states 11/16/2010    Zachery Conch MOT, OTR/L  03/14/2020, 2:43 PM  [facility] [address] [place], [place], [address] Phone: [phone]   Fax:  [phone]  Name: Katie Nunez MRN: [id number] Date of Birth: 10/07/1955

## 2020-03-15 NOTE — Therapy (Signed)
[facility] [address] [place], [place], [address] Phone: [phone]   Fax:  [phone]  Physical Therapy Treatment  Patient Details  Name: Katie Nunez MRN: [id number] Date of Birth: March 28, 1955 Referring Provider (PT): [name] MD   Encounter Date: 03/14/2020    Past Medical History:  Diagnosis Date  . Achalasia   . Anxiety   . Dysplasia of cervix, low grade (CIN 1)   . Environmental allergies    "all year long" (12/27/2016)  . ESRD (end stage renal disease) on dialysis [facility])    "TTS; [place]" (12/27/2016)  . Fibromyalgia   . GERD (gastroesophageal reflux disease)   . Gout   . Hypertension   . IBS (irritable bowel syndrome)   . MVP (mitral valve prolapse)   . RA (rheumatoid arthritis) (HCC)    FOLLOWED BY DR. SHANAHAN  . Raynaud's disease   . Scleroderma ([place])   . Seasonal allergies   . Thrombocytopenia ([place]) 07/01/2016   Acute fall to 13,000 07/01/16  . Tubular adenoma 01/08/2008   CECUM  . Vitamin D deficiency     Past Surgical History:  Procedure Laterality Date  . ANKLE FRACTURE SURGERY Right   . AV FISTULA PLACEMENT Left 06/28/2016   Procedure: left arm ARTERIOVENOUS (AV) FISTULA CREATION;  Surgeon: Rosetta Posner, MD;  Location: [facility];  Service: Vascular;  Laterality: Left;  . BASCILIC VEIN TRANSPOSITION Left 09/27/2016   Procedure: LEFT UPPER ARM CEPHALIC VEIN TRANSPOSITION;  Surgeon: Rosetta Posner, MD;  Location: [facility];  Service: Vascular;  Laterality: Left;  . BREAST BIOPSY     "? side"  . CESAREAN SECTION  1994  . CO2 LASER OF CERVIX    . COLONOSCOPY W/ BIOPSIES  01/08/2008  . INSERTION OF DIALYSIS CATHETER Right 06/28/2016   Procedure: INSERTION OF DIALYSIS CATHETER, right internal jugular;  Surgeon: Rosetta Posner, MD;  Location: [facility];  Service: Vascular;  Laterality: Right;  . MYOMECTOMY    . NASAL ENDOSCOPY WITH EPISTAXIS CONTROL N/A 12/29/2019   Procedure: NASAL ENDOSCOPY  WITH EPISTAXIS CONTROL;  Surgeon: Leta Baptist, MD;  Location: [facility] OR;  Service: ENT;  Laterality: N/A;  . PELVIC LAPAROSCOPY  2011  . superficial thrombophlebitis Left 07-2014    There were no vitals filed for this visit.      03/14/20 0001  Ambulation/Gait  Ambulation/Gait Yes  Ambulation/Gait Assistance 4: Min guard  Ambulation/Gait Assistance Details ambulation w/o cane to assss balance and stability at patient request  Ambulation Distance (Feet) 115 Feet  Assistive device None  Ambulation Surface Level;Indoor  Knee/Hip Exercises: Aerobic  Nustep L2 x6' arms 12       03/14/20 0001  Balance Exercises: Standing  SLS with Vectors Solid surface;Upper extremity assist 1;1 rep;30 secs;Limitations  SLS with Vectors Limitations in // bars performed SLSwith tapping of tall ball 15x per LE followed by SLS and manipulation of ball for 30s ea. LE  Stepping Strategy Anterior;Posterior;10 reps;Limitations  Stepping Strategy Limitations 4" block used for stepping on/off/over 10x followed by lateral stepping over  Rockerboard Anterior/posterior;Lateral;EO;30 seconds;UE support;Limitations  Rockerboard Limitations 30s hold ea. position including tandem, EO, followed by active WS M/L and A/P for 30 reps ea.                             PT Short Term Goals - 03/12/20 1612      PT SHORT TERM GOAL #1  Title Ind with initial HEP and able to demo back to PT w/o need of VCs    Baseline no HEP; 03/12/20 demos HEP w/o need of VCs    Time 2    Period Weeks    Status Achieved    Target Date 02/27/20      PT SHORT TERM GOAL #2   Title Patient to obtain [facility] and bring it to PT sessions for adjustment and training    Baseline not using cane prior; 03/12/20 Has been using cane in clinic, adjusted to her body habitus    Time 2    Period Weeks    Status Achieved    Target Date 02/27/20             PT Long Term Goals - 02/13/20 1520      PT LONG TERM GOAL #1   Title Patient  to improve 5x STS score to < 20 sec    Baseline 24 seconds    Time 8    Period Weeks    Status New    Target Date 04/09/20      PT LONG TERM GOAL #2   Title Patient to demo 4+/5 R ankle/knee strength throughout    Baseline 4/5 R ankle knee strength    Time 8    Period Weeks    Status New    Target Date 04/09/20      PT LONG TERM GOAL #3   Title Patient to decerease TUG time to ,13.5 sec w/o AD    Baseline 18 sec TUG average of 2 trials    Time 8    Period Weeks    Status New    Target Date 04/09/20      PT LONG TERM GOAL #4   Title Ambulate 1036f with LRAD across outdoor surfaces under distant S    Baseline limited to in home ambulation    Time 8    Period Weeks    Status New    Target Date 04/09/20      PT LONG TERM GOAL #5   Title Return to driving as appropriate    Baseline currently not driving    Time 8    Period Weeks    Status New    Target Date 04/09/20              03/14/20 1532  Plan  Clinical Impression Statement skilled intervention today included R knee strengthening with focus of TKE, ambulation with and without cane to assess balance, activities to promote R DF and high stepping, requires cuing for proper foot placement/spacing  Personal Factors and Comorbidities Age;Comorbidity 2  Comorbidities ESRD(HD T, Th, Sat), R ankle fx 2000  Examination-Participation Restrictions Community Activity  Pt will benefit from skilled therapeutic intervention in order to improve on the following deficits Postural dysfunction;Decreased balance;Impaired flexibility;Abnormal gait;Decreased mobility;Difficulty walking;Decreased coordination  Stability/Clinical Decision Making Stable/Uncomplicated  Rehab Potential Good  PT Frequency 2x / week  PT Duration 4 weeks  PT Treatment/Interventions ADLs/Self Care Home Management;Cryotherapy;Electrical Stimulation;Ultrasound;Therapeutic exercise;Balance training;Patient/family education;Manual techniques;Dry  needling;Therapeutic activities;Neuromuscular re-education;DME Instruction;Gait training;Stair training;Functional mobility training;Orthotic Fit/Training  PT Next Visit Plan check if brace has come in and check fit and function, continue to work on M/L balance and stepping tasks involving R DF, cleared to ambulate in home w/o cane, assess STGs  PT Home Exercise Plan heel/toe raises, sidestepping against green band, retrowalikng  Consulted and Agree with Plan of Care Patient  Patient will benefit from skilled therapeutic intervention in order to improve the following deficits and impairments:  Postural dysfunction,Decreased balance,Impaired flexibility,Abnormal gait,Decreased mobility,Difficulty walking,Decreased coordination  Visit Diagnosis: Unsteadiness on feet  Muscle weakness (generalized)     Problem List Patient Active Problem List   Diagnosis Date Noted  . Abnormality of gait 02/25/2020  . Protein-calorie malnutrition, severe 01/19/2020  . Epistaxis   . Sleep disturbance   . Slow transit constipation   . Hemorrhoids   . Chronic systolic congestive heart failure ([place])   . ESRD on dialysis ([place])   . Right hemiparesis ([place])   . Pressure injury of skin 12/20/2019  . Intraparenchymal hemorrhage of brain ([place]) 12/18/2019  . ICH (intracerebral hemorrhage) ([place]) 12/12/2019  . Viral disease 08/22/2019  . Chronic right-sided heart failure ([place]) 07/09/2019  . Supraventricular tachycardia ([place]) 07/09/2019  . Other cirrhosis of liver ([place]) 06/27/2019  . Heart failure ([place]) 02/21/2019  . Heart palpitations 08/14/2018  . Other fluid overload 07/27/2018  . Encounter for long-term (current) use of other medications 01/18/2018  . Hypothyroidism 01/18/2018  . Myalgia and myositis 01/18/2018  . Chronic nephritis 01/18/2018  . Other interstitial pulmonary diseases with fibrosis in diseases classified elsewhere ([place]) 01/18/2018  . Other long term (current) drug therapy  01/18/2018  . Sleep apnea 01/18/2018  . Unspecified persistent mental disorders due to conditions classified elsewhere 01/18/2018  . Venous reflux 01/18/2018  . Vitamin B12 deficiency 01/18/2018  . Vitamin D deficiency 01/18/2018  . Obstructive lung disease ([place]) 12/30/2017  . Other pruritus 12/27/2017  . Eruption cyst 12/19/2017  . 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 ([place]) 11/28/2017  . Chronic cough 11/11/2017  . Pulmonary hypertension ([place]) 11/11/2017  . Pulmonary arterial hypertension ([place]) 10/07/2017  . Acute ITP ([place]) 01/05/2017  . ESRD (end stage renal disease) ([place]) 12/28/2016  . Encounter for removal of sutures 09/09/2016  . Coagulation defect, unspecified ([place]) 09/01/2016  . Underimmunization status 08/05/2016  . Hemolytic anemia ([place]) 07/26/2016  . Epistaxis, recurrent 07/26/2016  . Unspecified protein-calorie malnutrition ([place]) 07/13/2016  . Aftercare including intermittent dialysis ([place]) 07/07/2016  . Anemia in chronic kidney disease 07/07/2016  . Hypokalemia 07/07/2016  . Iron deficiency anemia, unspecified 07/07/2016  . 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 Nonrheumatic mitral (valve) prolapse 07/07/2016  . Other irritable bowel syndrome 07/07/2016  . Other secondary thrombocytopenia 07/07/2016  . Secondary hyperparathyroidism of renal origin ([place]) 07/07/2016  . Thrombocytopenia ([place]) 07/01/2016  . ARF (acute renal failure) ([place]) 06/25/2016  . Anxiety 06/25/2016  . Bilateral carpal tunnel syndrome 10/22/2015  . Chronic gout without tophus 10/22/2015  . Chronic nonintractable headache 10/08/2015  . Fibroid uterus 01/03/2012  . H/O vitamin D deficiency 01/03/2012  . Post-menopausal 01/03/2012  . Hereditary and idiopathic peripheral neuropathy 11/19/2011  . Intestinal malabsorption 11/19/2011  . Lichen planus 97/58/8325  . Low back pain 11/19/2011  . Diffuse spasm of esophagus 11/11/2011  . ESR raised 11/11/2011  .  Postinflammatory pulmonary fibrosis ([place]) 11/11/2011  . Scleroderma ([place]) 11/16/2010  . Rheumatoid arthritis ([place]) 11/16/2010  . Raynaud's disease 11/16/2010  . Symptomatic menopausal or female climacteric states 11/16/2010    Lanice Shirts PT 03/15/2020, 11:55 PM  [facility] [address] [place], [place], [address] Phone: [phone]   Fax:  [phone]  Name: DOTTY GONZALO MRN: [id number] Date of Birth: 1955-05-11

## 2020-03-19 ENCOUNTER — Other Ambulatory Visit: Payer: Self-pay

## 2020-03-19 ENCOUNTER — Ambulatory Visit: Payer: Medicare Other | Admitting: Occupational Therapy

## 2020-03-19 ENCOUNTER — Telehealth: Payer: Self-pay | Admitting: Adult Health

## 2020-03-19 ENCOUNTER — Encounter: Payer: Self-pay | Admitting: Physical Therapy

## 2020-03-19 ENCOUNTER — Ambulatory Visit: Payer: Medicare Other | Admitting: Physical Therapy

## 2020-03-19 DIAGNOSIS — M6281 Muscle weakness (generalized): Secondary | ICD-10-CM

## 2020-03-19 DIAGNOSIS — R2689 Other abnormalities of gait and mobility: Secondary | ICD-10-CM

## 2020-03-19 DIAGNOSIS — R2681 Unsteadiness on feet: Secondary | ICD-10-CM

## 2020-03-19 DIAGNOSIS — R278 Other lack of coordination: Secondary | ICD-10-CM

## 2020-03-19 NOTE — Telephone Encounter (Signed)
Katie Nunez - Pt saw [name] on 2/9 and she is stating was prescribed the generic for Lipitor at the hospital and she is out and needs refills. Is this something [name] can prescribe? Insuance is asking for 90 day supply "O[facility]um".  She stated her PCP knows nothing about the medicatoin so she came into the lobby to see [name] can refill.

## 2020-03-19 NOTE — Therapy (Signed)
[facility] [address] [place], [place], [address] Phone: [phone]   Fax:  [phone]  Occupational Therapy Treatment & 10th visit progress note  Patient Details  Name: Katie Nunez MRN: [id number] Date of Birth: 21-Nov-1955 Referring Provider (OT): Molli Barrows   Encounter Date: 03/19/2020   OT End of Session - 03/19/20 1534    Visit Number 10    Number of Visits 17    Date for OT Re-Evaluation 04/09/20    Authorization Type UHC Medicare    Authorization Time Period 90 day cert    Authorization - Visit Number 10    Progress Note Due on Visit 10    OT Start Time 1531    OT Stop Time 1612    OT Time Calculation (min) 41 min    Activity Tolerance Patient tolerated treatment well    Behavior During Therapy [facility] for tasks assessed/performed           Past Medical History:  Diagnosis Date  . Achalasia   . Anxiety   . Dysplasia of cervix, low grade (CIN 1)   . Environmental allergies    "all year long" (12/27/2016)  . ESRD (end stage renal disease) on dialysis [facility])    "TTS; [place]" (12/27/2016)  . Fibromyalgia   . GERD (gastroesophageal reflux disease)   . Gout   . Hypertension   . IBS (irritable bowel syndrome)   . MVP (mitral valve prolapse)   . RA (rheumatoid arthritis) (HCC)    FOLLOWED BY DR. SHANAHAN  . Raynaud's disease   . Scleroderma ([place])   . Seasonal allergies   . Thrombocytopenia ([place]) 07/01/2016   Acute fall to 13,000 07/01/16  . Tubular adenoma 01/08/2008   CECUM  . Vitamin D deficiency     Past Surgical History:  Procedure Laterality Date  . ANKLE FRACTURE SURGERY Right   . AV FISTULA PLACEMENT Left 06/28/2016   Procedure: left arm ARTERIOVENOUS (AV) FISTULA CREATION;  Surgeon: Rosetta Posner, MD;  Location: [facility];  Service: Vascular;  Laterality: Left;  . BASCILIC VEIN TRANSPOSITION Left 09/27/2016   Procedure: LEFT UPPER ARM CEPHALIC VEIN TRANSPOSITION;  Surgeon: Rosetta Posner, MD;  Location: [facility];  Service: Vascular;  Laterality: Left;  . BREAST BIOPSY     "? side"  . CESAREAN SECTION  1994  . CO2 LASER OF CERVIX    . COLONOSCOPY W/ BIOPSIES  01/08/2008  . INSERTION OF DIALYSIS CATHETER Right 06/28/2016   Procedure: INSERTION OF DIALYSIS CATHETER, right internal jugular;  Surgeon: Rosetta Posner, MD;  Location: [facility];  Service: Vascular;  Laterality: Right;  . MYOMECTOMY    . NASAL ENDOSCOPY WITH EPISTAXIS CONTROL N/A 12/29/2019   Procedure: NASAL ENDOSCOPY WITH EPISTAXIS CONTROL;  Surgeon: Leta Baptist, MD;  Location: [facility] OR;  Service: ENT;  Laterality: N/A;  . PELVIC LAPAROSCOPY  2011  . superficial thrombophlebitis Left 07-2014    There were no vitals filed for this visit.   Subjective Assessment - 03/19/20 1539    Subjective  Pt reports tightness in the right shoulder.    Pertinent History Scleroderma, RA,HTN, ESRD (dialysis), hx R ankle Fx, 2000, anxiety,fibromyalgia, gout, HTN, MVP, Raynaud's  disease, C section, ostructive lung disease, B CTS, hereditary and periferal neuropathy, venous reflux, ICH    Patient Stated Goals write and use RUE better, drive agin    Currently in Pain? Yes    Pain Score 3     Pain  Location Shoulder    Pain Orientation Right    Pain Descriptors / Indicators Tightness    Pain Type Acute pain    Pain Onset More than a month ago    Pain Frequency Intermittent              Occupational Therapy Progress Note  Dates of Reporting Period: 02/13/2020 to 03/19/2020  Objective Reports of Subjective Statement: "My shoulder still feels tight" "I'm gonna try the coban at home"  Objective Measurements: Pt making progress with goals. Met 3/5 STGs and 1/5 LTGs at this time.  Goal Update: 3/5 STGs and 1/5 LTGs met  Plan: Skilled OT continues to be beneficial. Continue working on posture, shoulder and scap stabilization and RUE range of motion, handwriting  Reason Skilled Services are Required: to continue to work towards  deficits to increase independence, decrease pain and caregiver burden.             OT Treatments/Exercises (OP) - 03/19/20 1540      Neurological Re-education Exercises   Other Information scap stabilization whie supine on mat with moist heat with emphasis on scap retraction into mat for strengthening. 10 x seated cat/cow on edge of mat for working on scapular mobility and correcting of malpositioning      Modalities   Modalities Moist Heat      Moist Heat Therapy   Number Minutes Moist Heat 12 Minutes   supine prolonged stretch and working on decreasing rounded shoulders and working on malpositioning of BUE shoulders   Moist Heat Location Shoulder   RUE     Fine Motor Coordination (Hand/Wrist)   Handwriting writing paragraph. Pt with success with use of coban on pen and wrote paragraph with approximately 75% accuracy.                    OT Short Term Goals - 03/19/20 1534      OT SHORT TERM GOAL #1   Title I with inital HEP.    Time 4    Period Weeks    Status On-going      OT SHORT TERM GOAL #2   Title Pt will demonstrate improved fine motor coordination for ADLs as evidenced by decreasing 9 hole peg test score for RUE to 52 secs or less    Baseline RUE 60.56, LUE 32.81    Time 4    Period Weeks    Status Achieved   RUE 43.84s     OT SHORT TERM GOAL #3   Title Pt will perform light home management/simple cooking tasks with supervision and min v.c    Time 4    Period Weeks    Status Achieved      OT SHORT TERM GOAL #4   Title Pt will demonstrate improved RUE functional use as eveidenced by increasing box/ blocks score to 25 blocks with RUE.    Time 4    Period Weeks    Status Achieved   RUE 30 blocks on 03/14/2020     OT SHORT TERM GOAL #5   Title Pt will write a short paragraph with at least 90% legibility.    Time 4    Period Weeks    Status On-going   75% accuracy legibility for 03/19/20            OT Long Term Goals - 03/14/20 1416       OT LONG TERM GOAL #1   Title I with updated HEP.-04/09/20  Time 8    Period Weeks    Status New      OT LONG TERM GOAL #2   Title Pt will perform basic home managment/ cooking modified Independently    Time 8    Period Weeks    Status New      OT LONG TERM GOAL #3   Title Pt will demonstrate improved fine motor coordination for ADLs as evidenced by decreasing 9 hole peg test score for RUE to 45 secs or less.    Time 8    Period Weeks    Status Achieved   43.84s RUE on 03/14/20     OT LONG TERM GOAL #4   Title Pt will write a short paragraph with 100% legibility.    Time 8    Period Weeks    Status New      OT LONG TERM GOAL #5   Title Pt will demonstrate ability to retrieve a lightweight object at 125 shoulder flexion with RUE demonstrating good control.    Time 8    Period Weeks    Status New                 Plan - 03/19/20 1600    Clinical Impression Statement Pt has met 3/5 STGs and 1/5 LTGs. Pt has made improvements with fine motor coordination but continues to present with rounded shoulders and posture resulting in pain in RUE shoulder. Pt has increased legibility with handwriting. Skilled occupational therapy continues to be beneficial for patient and continuing working towards goals.    OT Occupational Profile and History Detailed Assessment- Review of Records and additional review of physical, cognitive, psychosocial history related to current functional performance    Occupational performance deficits (Please refer to evaluation for details): ADL's;IADL's;Leisure;Rest and Sleep;Social Participation;Other    Body Structure / Function / Physical Skills ADL;Endurance;Decreased knowledge of precautions;Decreased knowledge of use of DME;Flexibility;IADL;Pain;Skin integrity;Edema;Mobility;ROM    Rehab Potential Good    OT Frequency 2x / week    OT Duration 8 weeks    OT Treatment/Interventions Self-care/ADL training;Therapeutic exercise;DME and/or AE  instruction;Therapist, nutritional;Compression bandaging;Other (comment);Manual Therapy;Scar mobilization;Coping strategies training;Energy conservation;Manual lymph drainage;Passive range of motion;Therapeutic activities    Plan review shoulder flexion in supine cane, pt requires cues to maintain scapular retraction with exercise, add to HEP prn , handwriting    Consulted and Agree with Plan of Care Patient           Patient will benefit from skilled therapeutic intervention in order to improve the following deficits and impairments:   Body Structure / Function / Physical Skills: ADL,Endurance,Decreased knowledge of precautions,Decreased knowledge of use of DME,Flexibility,IADL,Pain,Skin integrity,Edema,Mobility,ROM       Visit Diagnosis: No diagnosis found.    Problem List Patient Active Problem List   Diagnosis Date Noted  . Abnormality of gait 02/25/2020  . Protein-calorie malnutrition, severe 01/19/2020  . Epistaxis   . Sleep disturbance   . Slow transit constipation   . Hemorrhoids   . Chronic systolic congestive heart failure ([place])   . ESRD on dialysis ([place])   . Right hemiparesis ([place])   . Pressure injury of skin 12/20/2019  . Intraparenchymal hemorrhage of brain ([place]) 12/18/2019  . ICH (intracerebral hemorrhage) ([place]) 12/12/2019  . Viral disease 08/22/2019  . Chronic right-sided heart failure ([place]) 07/09/2019  . Supraventricular tachycardia ([place]) 07/09/2019  . Other cirrhosis of liver ([place]) 06/27/2019  . Heart failure ([place]) 02/21/2019  . Heart palpitations 08/14/2018  . Other fluid  overload 07/27/2018  . Encounter for long-term (current) use of other medications 01/18/2018  . Hypothyroidism 01/18/2018  . Myalgia and myositis 01/18/2018  . Chronic nephritis 01/18/2018  . Other interstitial pulmonary diseases with fibrosis in diseases classified elsewhere ([place]) 01/18/2018  . Other long term (current) drug therapy 01/18/2018  . Sleep apnea 01/18/2018  .  Unspecified persistent mental disorders due to conditions classified elsewhere 01/18/2018  . Venous reflux 01/18/2018  . Vitamin B12 deficiency 01/18/2018  . Vitamin D deficiency 01/18/2018  . Obstructive lung disease ([place]) 12/30/2017  . Other pruritus 12/27/2017  . Eruption cyst 12/19/2017  . 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 ([place]) 11/28/2017  . Chronic cough 11/11/2017  . Pulmonary hypertension ([place]) 11/11/2017  . Pulmonary arterial hypertension ([place]) 10/07/2017  . Acute ITP ([place]) 01/05/2017  . ESRD (end stage renal disease) ([place]) 12/28/2016  . Encounter for removal of sutures 09/09/2016  . Coagulation defect, unspecified ([place]) 09/01/2016  . Underimmunization status 08/05/2016  . Hemolytic anemia ([place]) 07/26/2016  . Epistaxis, recurrent 07/26/2016  . Unspecified protein-calorie malnutrition ([place]) 07/13/2016  . Aftercare including intermittent dialysis ([place]) 07/07/2016  . Anemia in chronic kidney disease 07/07/2016  . Hypokalemia 07/07/2016  . Iron deficiency anemia, unspecified 07/07/2016  . 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 Nonrheumatic mitral (valve) prolapse 07/07/2016  . Other irritable bowel syndrome 07/07/2016  . Other secondary thrombocytopenia 07/07/2016  . Secondary hyperparathyroidism of renal origin ([place]) 07/07/2016  . Thrombocytopenia ([place]) 07/01/2016  . ARF (acute renal failure) ([place]) 06/25/2016  . Anxiety 06/25/2016  . Bilateral carpal tunnel syndrome 10/22/2015  . Chronic gout without tophus 10/22/2015  . Chronic nonintractable headache 10/08/2015  . Fibroid uterus 01/03/2012  . H/O vitamin D deficiency 01/03/2012  . Post-menopausal 01/03/2012  . Hereditary and idiopathic peripheral neuropathy 11/19/2011  . Intestinal malabsorption 11/19/2011  . Lichen planus 90/12/2239  . Low back pain 11/19/2011  . Diffuse spasm of esophagus 11/11/2011  . ESR raised 11/11/2011  . Postinflammatory pulmonary fibrosis ([place]) 11/11/2011  .  Scleroderma ([place]) 11/16/2010  . Rheumatoid arthritis ([place]) 11/16/2010  . Raynaud's disease 11/16/2010  . Symptomatic menopausal or female climacteric states 11/16/2010    Zachery Conch MOT, OTR/L  03/19/2020, 4:31 PM  [facility] [address] [place], [place], [address] Phone: [phone]   Fax:  [phone]  Name: Katie Nunez MRN: [id number] Date of Birth: 07/24/1955

## 2020-03-20 ENCOUNTER — Other Ambulatory Visit: Payer: Self-pay | Admitting: Physical Medicine and Rehabilitation

## 2020-03-20 MED ORDER — ATORVASTATIN CALCIUM 40 MG PO TABS: 40.0000 mg | ORAL_TABLET | Freq: Every day | ORAL | 3 refills | Status: DC

## 2020-03-20 NOTE — Therapy (Signed)
[facility] [address] [place], [place], [address] Phone: [phone]   Fax:  [phone]  Physical Therapy Treatment  Patient Details  Name: Katie Nunez MRN: [id number] Date of Birth: 1955/11/25 Referring Provider (PT): [name] MD   Encounter Date: 03/19/2020   PT End of Session - 03/19/20 1454    Visit Number 10    Number of Visits 12    Date for PT Re-Evaluation 04/13/20    Authorization Type UHC-MC    PT Start Time 2035   pt running late for appt today   PT Stop Time 1530    PT Time Calculation (min) 39 min    Equipment Utilized During Treatment Gait belt    Activity Tolerance Patient tolerated treatment well    Behavior During Therapy [facility] for tasks assessed/performed           Past Medical History:  Diagnosis Date  . Achalasia   . Anxiety   . Dysplasia of cervix, low grade (CIN 1)   . Environmental allergies    "all year long" (12/27/2016)  . ESRD (end stage renal disease) on dialysis [facility])    "TTS; [place]" (12/27/2016)  . Fibromyalgia   . GERD (gastroesophageal reflux disease)   . Gout   . Hypertension   . IBS (irritable bowel syndrome)   . MVP (mitral valve prolapse)   . RA (rheumatoid arthritis) (HCC)    FOLLOWED BY DR. SHANAHAN  . Raynaud's disease   . Scleroderma ([place])   . Seasonal allergies   . Thrombocytopenia ([place]) 07/01/2016   Acute fall to 13,000 07/01/16  . Tubular adenoma 01/08/2008   CECUM  . Vitamin D deficiency     Past Surgical History:  Procedure Laterality Date  . ANKLE FRACTURE SURGERY Right   . AV FISTULA PLACEMENT Left 06/28/2016   Procedure: left arm ARTERIOVENOUS (AV) FISTULA CREATION;  Surgeon: Rosetta Posner, MD;  Location: [facility];  Service: Vascular;  Laterality: Left;  . BASCILIC VEIN TRANSPOSITION Left 09/27/2016   Procedure: LEFT UPPER ARM CEPHALIC VEIN TRANSPOSITION;  Surgeon: Rosetta Posner, MD;  Location: [facility];  Service: Vascular;  Laterality:  Left;  . BREAST BIOPSY     "? side"  . CESAREAN SECTION  1994  . CO2 LASER OF CERVIX    . COLONOSCOPY W/ BIOPSIES  01/08/2008  . INSERTION OF DIALYSIS CATHETER Right 06/28/2016   Procedure: INSERTION OF DIALYSIS CATHETER, right internal jugular;  Surgeon: Rosetta Posner, MD;  Location: [facility];  Service: Vascular;  Laterality: Right;  . MYOMECTOMY    . NASAL ENDOSCOPY WITH EPISTAXIS CONTROL N/A 12/29/2019   Procedure: NASAL ENDOSCOPY WITH EPISTAXIS CONTROL;  Surgeon: Leta Baptist, MD;  Location: [facility] OR;  Service: ENT;  Laterality: N/A;  . PELVIC LAPAROSCOPY  2011  . superficial thrombophlebitis Left 07-2014    There were no vitals filed for this visit.   Subjective Assessment - 03/19/20 1453    Subjective Reports right leg keeps feeling like it's getting heavier. Has brace with her today she ordered off [employer]. Denies any falls    Pertinent History Scleroderma, RA, pulmonary HTN, kidney disease (dialysis) Tu,TH and Sat, Rt ankle fx/dislocation/surgery 2000    Limitations Walking    Patient Stated Goals strengthen ankle and knee R, get off of cane    Currently in Pain? No/denies    Pain Score 0-No pain  [facility] Adult PT Treatment/Exercise - 03/19/20 1455      Transfers   Transfers Sit to Stand;Stand to Sit    Sit to Stand 5: Supervision    Stand to Sit 5: Supervision      Ambulation/Gait   Ambulation/Gait Yes    Ambulation/Gait Assistance 4: Min guard    Ambulation/Gait Assistance Details after getting pt's Sabo foot up brace set up performed gait around gym with cane with improved hip/knee flexion noted and improved foot clearance noted as well.    Ambulation Distance (Feet) 115 Feet    Assistive device Straight cane    Gait Pattern Step-through pattern;Decreased dorsiflexion - right    Ambulation Surface Level;Indoor      Self-Care   Self-Care Other Self-Care Comments    Other Self-Care Comments  pt arrived with new brace (Sabo foot up brace that attaches  to shoe, not the laces) needing help to don/attatch to her shoes. pt has sneakers with elastic laces on them. She brought in other non elastic laces to put on shoes. Increased time and effort spent trying to get the screw/washer through the eye hold with the thicker laces, attempted multiple sizes of screws that were sent with brace. Eventually decided to keep the elastic laces as they are thinner, the brace attaches to the shoe itself and not the laces and should make it easier for the pt to self don the brace. Able to get both holder pieces attached for brace strings to attach to with the elastic laces.                    PT Short Term Goals - 03/12/20 1612      PT SHORT TERM GOAL #1   Title Ind with initial HEP and able to demo back to PT w/o need of VCs    Baseline no HEP; 03/12/20 demos HEP w/o need of VCs    Time 2    Period Weeks    Status Achieved    Target Date 02/27/20      PT SHORT TERM GOAL #2   Title Patient to obtain [facility] and bring it to PT sessions for adjustment and training    Baseline not using cane prior; 03/12/20 Has been using cane in clinic, adjusted to her body habitus    Time 2    Period Weeks    Status Achieved    Target Date 02/27/20             PT Long Term Goals - 02/13/20 1520      PT LONG TERM GOAL #1   Title Patient to improve 5x STS score to < 20 sec    Baseline 24 seconds    Time 8    Period Weeks    Status New    Target Date 04/09/20      PT LONG TERM GOAL #2   Title Patient to demo 4+/5 R ankle/knee strength throughout    Baseline 4/5 R ankle knee strength    Time 8    Period Weeks    Status New    Target Date 04/09/20      PT LONG TERM GOAL #3   Title Patient to decerease TUG time to ,13.5 sec w/o AD    Baseline 18 sec TUG average of 2 trials    Time 8    Period Weeks    Status New    Target Date 04/09/20      PT LONG TERM  GOAL #4   Title Ambulate 1039f with LRAD across outdoor surfaces under distant S    Baseline  limited to in home ambulation    Time 8    Period Weeks    Status New    Target Date 04/09/20      PT LONG TERM GOAL #5   Title Return to driving as appropriate    Baseline currently not driving    Time 8    Period Weeks    Status New    Target Date 04/09/20                 Plan - 03/19/20 1454    Clinical Impression Statement Today's skilled session initially focused on set up of pt's new foot up brace with increased time/effort needed. Remainder of session focused on gait with new brace/cane with improved foot clearance noted. The pt is progressing toward goals and should benefit from continued PT to progress toward unmet goals.    Personal Factors and Comorbidities Age;Comorbidity 2    Comorbidities ESRD(HD T, Th, Sat), R ankle fx 2000    Examination-Participation Restrictions Community Activity    Stability/Clinical Decision Making Stable/Uncomplicated    Rehab Potential Good    PT Frequency 2x / week    PT Duration 4 weeks    PT Treatment/Interventions ADLs/Self Care Home Management;Cryotherapy;Electrical Stimulation;Ultrasound;Therapeutic exercise;Balance training;Patient/family education;Manual techniques;Dry needling;Therapeutic activities;Neuromuscular re-education;DME Instruction;Gait training;Stair training;Functional mobility training;Orthotic Fit/Training    PT Next Visit Plan continue to work on M/L balance and stepping tasks involving R DF, cleared to ambulate in home w/o cane,    PT Home Exercise Plan heel/toe raises, sidestepping against green band, retrowalikng    Consulted and Agree with Plan of Care Patient           Patient will benefit from skilled therapeutic intervention in order to improve the following deficits and impairments:  Postural dysfunction,Decreased balance,Impaired flexibility,Abnormal gait,Decreased mobility,Difficulty walking,Decreased coordination  Visit Diagnosis: Muscle weakness (generalized)  Unsteadiness on feet  Other  abnormalities of gait and mobility     Problem List Patient Active Problem List   Diagnosis Date Noted  . Abnormality of gait 02/25/2020  . Protein-calorie malnutrition, severe 01/19/2020  . Epistaxis   . Sleep disturbance   . Slow transit constipation   . Hemorrhoids   . Chronic systolic congestive heart failure ([place]Madras   . ESRD on dialysis ([facility]   . Right hemiparesis ([facility]oyle   . Pressure injury of skin 12/20/2019  . Intraparenchymal hemorrhage of brain (HTwig[place] 12/18/2019  . ICH (intracerebral hemorrhage) ([place] 12/12/2019  . Viral disease 08/22/2019  . Chronic right-sided heart failure ([facility] [place] 07/09/2019  . Supraventricular tachycardia ([place] 07/09/2019  . Other cirrhosis of liver (HRaysal 06/27/2019  . Heart failure ([facility] 02/21/2019  . Heart palpitations 08/14/2018  . Other fluid overload 07/27/2018  . Encounter for long-term (current) use of other medications 01/18/2018  . Hypothyroidism 01/18/2018  . Myalgia and myositis 01/18/2018  . Chronic nephritis 01/18/2018  . Other interstitial pulmonary diseases with fibrosis in diseases classified elsewhere ([facility] 01/18/2018  . Other long term (current) drug therapy 01/18/2018  . Sleep apnea 01/18/2018  . Unspecified persistent mental disorders due to conditions classified elsewhere 01/18/2018  . Venous reflux 01/18/2018  . Vitamin B12 deficiency 01/18/2018  . Vitamin D deficiency 01/18/2018  . Obstructive lung disease (HEpping 12/30/2017  . Other pruritus 12/27/2017  . Eruption cyst 12/19/2017  . 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 Chronic cough  11/11/2017  . Pulmonary hypertension ([place]) 11/11/2017  . Pulmonary arterial hypertension ([place]) 10/07/2017  . Acute ITP ([place]) 01/05/2017  . ESRD (end stage renal disease) ([place]) 12/28/2016  . Encounter for removal of sutures 09/09/2016  . Coagulation defect, unspecified ([place]) 09/01/2016  . Underimmunization status 08/05/2016  . Hemolytic  anemia ([place]) 07/26/2016  . Epistaxis, recurrent 07/26/2016  . Unspecified protein-calorie malnutrition ([place]) 07/13/2016  . Aftercare including intermittent dialysis ([place]) 07/07/2016  . Anemia in chronic kidney disease 07/07/2016  . Hypokalemia 07/07/2016  . Iron deficiency anemia, unspecified 07/07/2016  . 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 Nonrheumatic mitral (valve) prolapse 07/07/2016  . Other irritable bowel syndrome 07/07/2016  . Other secondary thrombocytopenia 07/07/2016  . Secondary hyperparathyroidism of renal origin ([place]) 07/07/2016  . Thrombocytopenia ([place]) 07/01/2016  . ARF (acute renal failure) ([place]) 06/25/2016  . Anxiety 06/25/2016  . Bilateral carpal tunnel syndrome 10/22/2015  . Chronic gout without tophus 10/22/2015  . Chronic nonintractable headache 10/08/2015  . Fibroid uterus 01/03/2012  . H/O vitamin D deficiency 01/03/2012  . Post-menopausal 01/03/2012  . Hereditary and idiopathic peripheral neuropathy 11/19/2011  . Intestinal malabsorption 11/19/2011  . Lichen planus 86/75/4492  . Low back pain 11/19/2011  . Diffuse spasm of esophagus 11/11/2011  . ESR raised 11/11/2011  . Postinflammatory pulmonary fibrosis ([place]) 11/11/2011  . Scleroderma ([place]) 11/16/2010  . Rheumatoid arthritis ([place]) 11/16/2010  . Raynaud's disease 11/16/2010  . Symptomatic menopausal or female climacteric states 11/16/2010    Willow Ora, PTA, [facility] [address], [place], [place] [address] [phone] 03/20/20, 9:51 PM   Name: Katie Nunez MRN: [id number] Date of Birth: 06-16-1955

## 2020-03-20 NOTE — Telephone Encounter (Signed)
Refill provided but request future refills by PCP as we do not typically follow patients long-term as PCPs will manage secondary stroke risk factors

## 2020-03-20 NOTE — Addendum Note (Signed)
Addended by: Mal Misty on: 03/20/2020 08:02 AM   Modules accepted: Orders

## 2020-03-20 NOTE — Telephone Encounter (Signed)
Called patient and LVM  informed her [name] refilled lipitor, future refills through her PCP who will manage her stroke risk medications. Left # for questions.

## 2020-03-21 ENCOUNTER — Other Ambulatory Visit: Payer: Self-pay

## 2020-03-21 ENCOUNTER — Ambulatory Visit: Payer: Medicare Other

## 2020-03-21 ENCOUNTER — Ambulatory Visit: Payer: Medicare Other | Admitting: Occupational Therapy

## 2020-03-21 ENCOUNTER — Encounter: Payer: Self-pay | Admitting: Occupational Therapy

## 2020-03-21 DIAGNOSIS — M6281 Muscle weakness (generalized): Secondary | ICD-10-CM

## 2020-03-21 DIAGNOSIS — R2681 Unsteadiness on feet: Secondary | ICD-10-CM

## 2020-03-21 DIAGNOSIS — R278 Other lack of coordination: Secondary | ICD-10-CM

## 2020-03-21 DIAGNOSIS — R29818 Other symptoms and signs involving the nervous system: Secondary | ICD-10-CM

## 2020-03-21 NOTE — Therapy (Signed)
[facility] [address] [place], [place], [address] Phone: [phone]   Fax:  [phone]  Occupational Therapy Treatment  Patient Details  Name: Katie Nunez MRN: [id number] Date of Birth: [birth year] Referring Provider (OT): Molli Barrows   Encounter Date: 03/21/2020   OT End of Session - 03/21/20 1453    Visit Number 11    Number of Visits 17    Date for OT Re-Evaluation 04/09/20    Authorization Type UHC Medicare    Authorization Time Period 90 day cert    Authorization - Visit Number 11    Progress Note Due on Visit 20    OT Start Time 1452    OT Stop Time 1530    OT Time Calculation (min) 38 min    Activity Tolerance Patient tolerated treatment well    Behavior During Therapy [facility] for tasks assessed/performed           Past Medical History:  Diagnosis Date  . Achalasia   . Anxiety   . Dysplasia of cervix, low grade (CIN 1)   . Environmental allergies    "all year long" (12/27/2016)  . ESRD (end stage renal disease) on dialysis [facility])    "TTS; [place]" (12/27/2016)  . Fibromyalgia   . GERD (gastroesophageal reflux disease)   . Gout   . Hypertension   . IBS (irritable bowel syndrome)   . MVP (mitral valve prolapse)   . RA (rheumatoid arthritis) (HCC)    FOLLOWED BY DR. SHANAHAN  . Raynaud's disease   . Scleroderma ([place])   . Seasonal allergies   . Thrombocytopenia ([place]) 07/01/2016   Acute fall to 13,000 07/01/16  . Tubular adenoma 01/08/2008   CECUM  . Vitamin D deficiency     Past Surgical History:  Procedure Laterality Date  . ANKLE FRACTURE SURGERY Right   . AV FISTULA PLACEMENT Left 06/28/2016   Procedure: left arm ARTERIOVENOUS (AV) FISTULA CREATION;  Surgeon: Rosetta Posner, MD;  Location: [facility];  Service: Vascular;  Laterality: Left;  . BASCILIC VEIN TRANSPOSITION Left 09/27/2016   Procedure: LEFT UPPER ARM CEPHALIC VEIN TRANSPOSITION;  Surgeon: Rosetta Posner, MD;  Location: [facility];  Service: Vascular;  Laterality: Left;  . BREAST BIOPSY     "? side"  . CESAREAN SECTION  1994  . CO2 LASER OF CERVIX    . COLONOSCOPY W/ BIOPSIES  01/08/2008  . INSERTION OF DIALYSIS CATHETER Right 06/28/2016   Procedure: INSERTION OF DIALYSIS CATHETER, right internal jugular;  Surgeon: Rosetta Posner, MD;  Location: [facility];  Service: Vascular;  Laterality: Right;  . MYOMECTOMY    . NASAL ENDOSCOPY WITH EPISTAXIS CONTROL N/A 12/29/2019   Procedure: NASAL ENDOSCOPY WITH EPISTAXIS CONTROL;  Surgeon: Leta Baptist, MD;  Location: [facility] OR;  Service: ENT;  Laterality: N/A;  . PELVIC LAPAROSCOPY  2011  . superficial thrombophlebitis Left 07-2014    There were no vitals filed for this visit.   Subjective Assessment - 03/21/20 1454    Subjective  "that same shoulder stuff"    Pertinent History Scleroderma, RA,HTN, ESRD (dialysis), hx R ankle Fx, 2000, anxiety,fibromyalgia, gout, HTN, MVP, Raynaud's  disease, C section, ostructive lung disease, B CTS, hereditary and periferal neuropathy, venous reflux, ICH    Patient Stated Goals write and use RUE better, drive agin    Currently in Pain? Yes    Pain Score 3     Pain Location Shoulder    Pain Orientation Right  Pain Descriptors / Indicators Tightness    Pain Type Acute pain    Pain Onset More than a month ago    Pain Frequency Intermittent                        OT Treatments/Exercises (OP) - 03/21/20 1457      Neurological Re-education Exercises   Other Exercises 1 Scapula Retraction x 10 reps x 3 sets with verbal cues for upright seated posture with yellow theraband for some resistance.      Functional Reaching Activities   Mid Level placing resitsance clothespins 1-8# on antenna with mod verbal and visual cues for sitting upright and scapular retraction for decreasing forward shoulder positioning      Fine Motor Coordination (Hand/Wrist)   Handwriting writing words from word search list for working on increased legibility  and cooridnation with RUE. Pt is demonstrating increased legibiltiy with handwriting and printing words. Pt used coban grip for increased coordination    Grooved pegs completed with RUE for increase in coordination                    OT Short Term Goals - 03/19/20 1534      OT SHORT TERM GOAL #1   Title I with inital HEP.    Time 4    Period Weeks    Status On-going      OT SHORT TERM GOAL #2   Title Pt will demonstrate improved fine motor coordination for ADLs as evidenced by decreasing 9 hole peg test score for RUE to 52 secs or less    Baseline RUE 60.56, LUE 32.81    Time 4    Period Weeks    Status Achieved   RUE 43.84s     OT SHORT TERM GOAL #3   Title Pt will perform light home management/simple cooking tasks with supervision and min v.c    Time 4    Period Weeks    Status Achieved      OT SHORT TERM GOAL #4   Title Pt will demonstrate improved RUE functional use as eveidenced by increasing box/ blocks score to 25 blocks with RUE.    Time 4    Period Weeks    Status Achieved   RUE 30 blocks on 03/14/2020     OT SHORT TERM GOAL #5   Title Pt will write a short paragraph with at least 90% legibility.    Time 4    Period Weeks    Status On-going   75% accuracy legibility for 03/19/20            OT Long Term Goals - 03/14/20 1416      OT LONG TERM GOAL #1   Title I with updated HEP.-04/09/20    Time 8    Period Weeks    Status New      OT LONG TERM GOAL #2   Title Pt will perform basic home managment/ cooking modified Independently    Time 8    Period Weeks    Status New      OT LONG TERM GOAL #3   Title Pt will demonstrate improved fine motor coordination for ADLs as evidenced by decreasing 9 hole peg test score for RUE to 45 secs or less.    Time 8    Period Weeks    Status Achieved   43.84s RUE on 03/14/20     OT LONG TERM GOAL #4  Title Pt will write a short paragraph with 100% legibility.    Time 8    Period Weeks    Status New      OT  LONG TERM GOAL #5   Title Pt will demonstrate ability to retrieve a lightweight object at 125 shoulder flexion with RUE demonstrating good control.    Time 8    Period Weeks    Status New                 Plan - 03/21/20 1505    Clinical Impression Statement Pt continunes to progress. Pt with pain in RUE shoulder impeding overall function. Pt continues to present with rounded shoulders that may be contributing to pain.    OT Occupational Profile and History Detailed Assessment- Review of Records and additional review of physical, cognitive, psychosocial history related to current functional performance    Occupational performance deficits (Please refer to evaluation for details): ADL's;IADL's;Leisure;Rest and Sleep;Social Participation;Other    Body Structure / Function / Physical Skills ADL;Endurance;Decreased knowledge of precautions;Decreased knowledge of use of DME;Flexibility;IADL;Pain;Skin integrity;Edema;Mobility;ROM    Rehab Potential Good    OT Frequency 2x / week    OT Duration 8 weeks    OT Treatment/Interventions Self-care/ADL training;Therapeutic exercise;DME and/or AE instruction;Therapist, nutritional;Compression bandaging;Other (comment);Manual Therapy;Scar mobilization;Coping strategies training;Energy conservation;Manual lymph drainage;Passive range of motion;Therapeutic activities    Plan review shoulder flexion in supine cane, pt requires cues to maintain scapular retraction with exercise, add to HEP prn , handwriting    Consulted and Agree with Plan of Care Patient           Patient will benefit from skilled therapeutic intervention in order to improve the following deficits and impairments:   Body Structure / Function / Physical Skills: ADL,Endurance,Decreased knowledge of precautions,Decreased knowledge of use of DME,Flexibility,IADL,Pain,Skin integrity,Edema,Mobility,ROM       Visit Diagnosis: Muscle weakness (generalized)  Other lack of  coordination  Other symptoms and signs involving the nervous system    Problem List Patient Active Problem List   Diagnosis Date Noted  . Abnormality of gait 02/25/2020  . Protein-calorie malnutrition, severe 01/19/2020  . Epistaxis   . Sleep disturbance   . Slow transit constipation   . Hemorrhoids   . Chronic systolic congestive heart failure ([place])   . ESRD on dialysis ([place])   . Right hemiparesis ([place])   . Pressure injury of skin 12/20/2019  . Intraparenchymal hemorrhage of brain ([place]) 12/18/2019  . ICH (intracerebral hemorrhage) ([place]) 12/12/2019  . Viral disease 08/22/2019  . Chronic right-sided heart failure ([place]) 07/09/2019  . Supraventricular tachycardia ([place]) 07/09/2019  . Other cirrhosis of liver ([place]) 06/27/2019  . Heart failure ([place]) 02/21/2019  . Heart palpitations 08/14/2018  . Other fluid overload 07/27/2018  . Encounter for long-term (current) use of other medications 01/18/2018  . Hypothyroidism 01/18/2018  . Myalgia and myositis 01/18/2018  . Chronic nephritis 01/18/2018  . Other interstitial pulmonary diseases with fibrosis in diseases classified elsewhere ([place]) 01/18/2018  . Other long term (current) drug therapy 01/18/2018  . Sleep apnea 01/18/2018  . Unspecified persistent mental disorders due to conditions classified elsewhere 01/18/2018  . Venous reflux 01/18/2018  . Vitamin B12 deficiency 01/18/2018  . Vitamin D deficiency 01/18/2018  . Obstructive lung disease ([place]) 12/30/2017  . Other pruritus 12/27/2017  . Eruption cyst 12/19/2017  . 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 ([place]) 11/28/2017  . Chronic cough 11/11/2017  . Pulmonary hypertension ([place]) 11/11/2017  . Pulmonary arterial  hypertension ([place]) 10/07/2017  . Acute ITP ([place]) 01/05/2017  . ESRD (end stage renal disease) ([place]) 12/28/2016  . Encounter for removal of sutures 09/09/2016  . Coagulation defect, unspecified ([place]) 09/01/2016  . Underimmunization  status 08/05/2016  . Hemolytic anemia ([place]) 07/26/2016  . Epistaxis, recurrent 07/26/2016  . Unspecified protein-calorie malnutrition ([place]) 07/13/2016  . Aftercare including intermittent dialysis ([place]) 07/07/2016  . Anemia in chronic kidney disease 07/07/2016  . Hypokalemia 07/07/2016  . Iron deficiency anemia, unspecified 07/07/2016  . 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 Nonrheumatic mitral (valve) prolapse 07/07/2016  . Other irritable bowel syndrome 07/07/2016  . Other secondary thrombocytopenia 07/07/2016  . Secondary hyperparathyroidism of renal origin ([place]) 07/07/2016  . Thrombocytopenia ([place]) 07/01/2016  . ARF (acute renal failure) ([place]) 06/25/2016  . Anxiety 06/25/2016  . Bilateral carpal tunnel syndrome 10/22/2015  . Chronic gout without tophus 10/22/2015  . Chronic nonintractable headache 10/08/2015  . Fibroid uterus 01/03/2012  . H/O vitamin D deficiency 01/03/2012  . Post-menopausal 01/03/2012  . Hereditary and idiopathic peripheral neuropathy 11/19/2011  . Intestinal malabsorption 11/19/2011  . Lichen planus 01/13/7251  . Low back pain 11/19/2011  . Diffuse spasm of esophagus 11/11/2011  . ESR raised 11/11/2011  . Postinflammatory pulmonary fibrosis ([place]) 11/11/2011  . Scleroderma ([place]) 11/16/2010  . Rheumatoid arthritis ([place]) 11/16/2010  . Raynaud's disease 11/16/2010  . Symptomatic menopausal or female climacteric states 11/16/2010    Zachery Conch MOT, OTR/L  03/21/2020, 3:33 PM  [facility] [address] [place], [place], [address] Phone: [phone]   Fax:  [phone]  Name: KEILANY BURNETTE MRN: [id number] Date of Birth: 06/11/1955

## 2020-03-22 NOTE — Patient Instructions (Signed)
Continued to review donning, doffing and function of brace

## 2020-03-22 NOTE — Therapy (Signed)
[facility] [address] [place], [place], [address] Phone: [phone]   Fax:  [phone]  Physical Therapy Treatment  Patient Details  Name: Katie Nunez MRN: [id number] Date of Birth: [birth year] Referring Provider (PT): [name] MD   Encounter Date: 03/21/2020   PT End of Session - 03/21/20 1629    Visit Number 11    Number of Visits 12    Date for PT Re-Evaluation 04/13/20    Authorization Type UHC-MC    PT Start Time 1415    PT Stop Time 1445    PT Time Calculation (min) 30 min    Equipment Utilized During Treatment Gait belt    Activity Tolerance Patient tolerated treatment well    Behavior During Therapy [facility] for tasks assessed/performed           Past Medical History:  Diagnosis Date  . Achalasia   . Anxiety   . Dysplasia of cervix, low grade (CIN 1)   . Environmental allergies    "all year long" (12/27/2016)  . ESRD (end stage renal disease) on dialysis [facility])    "TTS; [facility]" (12/27/2016)  . Fibromyalgia   . GERD (gastroesophageal reflux disease)   . Gout   . Hypertension   . IBS (irritable bowel syndrome)   . MVP (mitral valve prolapse)   . RA (rheumatoid arthritis) (HCC)    FOLLOWED BY DR. SHANAHAN  . Raynaud's disease   . Scleroderma ([place])   . Seasonal allergies   . Thrombocytopenia ([place]) 07/01/2016   Acute fall to 13,000 07/01/16  . Tubular adenoma 01/08/2008   CECUM  . Vitamin D deficiency     Past Surgical History:  Procedure Laterality Date  . ANKLE FRACTURE SURGERY Right   . AV FISTULA PLACEMENT Left 06/28/2016   Procedure: left arm ARTERIOVENOUS (AV) FISTULA CREATION;  Surgeon: Rosetta Posner, MD;  Location: [facility];  Service: Vascular;  Laterality: Left;  . BASCILIC VEIN TRANSPOSITION Left 09/27/2016   Procedure: LEFT UPPER ARM CEPHALIC VEIN TRANSPOSITION;  Surgeon: Rosetta Posner, MD;  Location: [facility];  Service: Vascular;  Laterality: Left;  . BREAST BIOPSY     "?  side"  . CESAREAN SECTION  1994  . CO2 LASER OF CERVIX    . COLONOSCOPY W/ BIOPSIES  01/08/2008  . INSERTION OF DIALYSIS CATHETER Right 06/28/2016   Procedure: INSERTION OF DIALYSIS CATHETER, right internal jugular;  Surgeon: Rosetta Posner, MD;  Location: [facility];  Service: Vascular;  Laterality: Right;  . MYOMECTOMY    . NASAL ENDOSCOPY WITH EPISTAXIS CONTROL N/A 12/29/2019   Procedure: NASAL ENDOSCOPY WITH EPISTAXIS CONTROL;  Surgeon: Leta Baptist, MD;  Location: [facility] OR;  Service: ENT;  Laterality: N/A;  . PELVIC LAPAROSCOPY  2011  . superficial thrombophlebitis Left 07-2014    There were no vitals filed for this visit.   Subjective Assessment - 03/21/20 1627    Subjective no falls, has brace to try again today, 15' late due to transport issues    Pain Onset More than a month ago             03/21/20 0001  Ambulation/Gait  Ambulation/Gait Yes  Ambulation/Gait Assistance 4: Min guard  Ambulation Distance (Feet) 200 Feet  Assistive device Straight cane  Gait Pattern Step-through pattern  Ambulation Surface Level;Unlevel;Indoor;Outdoor  Knee/Hip Exercises: Standing  Other Standing Knee Exercises t-band kicking opposite LE on airex, performed at counter with UE support against red band, 2x15 per extremity  PT Education - 03/22/20 1629    Education Details review of brace operation    Person(s) Educated Patient    Methods Explanation;Demonstration;Tactile cues    Comprehension Verbalized understanding;Returned demonstration;Need further instruction            PT Short Term Goals - 03/12/20 1612      PT SHORT TERM GOAL #1   Title Ind with initial HEP and able to demo back to PT w/o need of VCs    Baseline no HEP; 03/12/20 demos HEP w/o need of VCs    Time 2    Period Weeks    Status Achieved    Target Date 02/27/20      PT SHORT TERM GOAL #2   Title Patient to obtain [facility] and bring it to PT  sessions for adjustment and training    Baseline not using cane prior; 03/12/20 Has been using cane in clinic, adjusted to her body habitus    Time 2    Period Weeks    Status Achieved    Target Date 02/27/20             PT Long Term Goals - 02/13/20 1520      PT LONG TERM GOAL #1   Title Patient to improve 5x STS score to < 20 sec    Baseline 24 seconds    Time 8    Period Weeks    Status New    Target Date 04/09/20      PT LONG TERM GOAL #2   Title Patient to demo 4+/5 R ankle/knee strength throughout    Baseline 4/5 R ankle knee strength    Time 8    Period Weeks    Status New    Target Date 04/09/20      PT LONG TERM GOAL #3   Title Patient to decerease TUG time to ,13.5 sec w/o AD    Baseline 18 sec TUG average of 2 trials    Time 8    Period Weeks    Status New    Target Date 04/09/20      PT LONG TERM GOAL #4   Title Ambulate 1017f with LRAD across outdoor surfaces under distant S    Baseline limited to in home ambulation    Time 8    Period Weeks    Status New    Target Date 04/09/20      PT LONG TERM GOAL #5   Title Return to driving as appropriate    Baseline currently not driving    Time 8    Period Weeks    Status New    Target Date 04/09/20                 Plan - 03/21/20 1630    Clinical Impression Statement Skilled interventio focused on review of brace application and effect on function/ambulation, improved DF noted but continued L hip weakness causes L toe catch at times w/o LOB however, fatigue limited ambulation distances    Personal Factors and Comorbidities Age;Comorbidity 2    Comorbidities ESRD(HD T, Th, Sat), R ankle fx 2000    Examination-Participation Restrictions Community Activity    Stability/Clinical Decision Making Stable/Uncomplicated    Rehab Potential Good    PT Frequency 2x / week    PT Duration 4 weeks    PT Treatment/Interventions ADLs/Self Care Home Management;Cryotherapy;Electrical  Stimulation;Ultrasound;Therapeutic exercise;Balance training;Patient/family education;Manual techniques;Dry needling;Therapeutic activities;Neuromuscular re-education;DME Instruction;Gait training;Stair training;Functional mobility training;Orthotic Fit/Training  PT Next Visit Plan review goals and discuss possible DC    PT Home Exercise Plan heel/toe raises, sidestepping against green band, retrowalikng    Consulted and Agree with Plan of Care Patient           Patient will benefit from skilled therapeutic intervention in order to improve the following deficits and impairments:  Postural dysfunction,Decreased balance,Impaired flexibility,Abnormal gait,Decreased mobility,Difficulty walking,Decreased coordination  Visit Diagnosis: Unsteadiness on feet  Muscle weakness (generalized)     Problem List Patient Active Problem List   Diagnosis Date Noted  . Abnormality of gait 02/25/2020  . Protein-calorie malnutrition, severe 01/19/2020  . Epistaxis   . Sleep disturbance   . Slow transit constipation   . Hemorrhoids   . Chronic systolic congestive heart failure ([place])   . ESRD on dialysis ([place])   . Right hemiparesis ([employer])   . Pressure injury of skin 12/20/2019  . Intraparenchymal hemorrhage of brain ([place]) 12/18/2019  . ICH (intracerebral hemorrhage) ([place]) 12/12/2019  . Viral disease 08/22/2019  . Chronic right-sided heart failure ([place]) 07/09/2019  . Supraventricular tachycardia ([place]) 07/09/2019  . Other cirrhosis of liver ([place]) 06/27/2019  . Heart failure ([place]) 02/21/2019  . Heart palpitations 08/14/2018  . Other fluid overload 07/27/2018  . Encounter for long-term (current) use of other medications 01/18/2018  . Hypothyroidism 01/18/2018  . Myalgia and myositis 01/18/2018  . Chronic nephritis 01/18/2018  . Other interstitial pulmonary diseases with fibrosis in diseases classified elsewhere ([place]) 01/18/2018  . Other long term (current) drug therapy 01/18/2018  . Sleep apnea  01/18/2018  . Unspecified persistent mental disorders due to conditions classified elsewhere 01/18/2018  . Venous reflux 01/18/2018  . Vitamin B12 deficiency 01/18/2018  . Vitamin D deficiency 01/18/2018  . Obstructive lung disease ([place]) 12/30/2017  . Other pruritus 12/27/2017  . Eruption cyst 12/19/2017  . 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 ([place]) 11/28/2017  . Chronic cough 11/11/2017  . Pulmonary hypertension ([place]) 11/11/2017  . Pulmonary arterial hypertension ([place]) 10/07/2017  . Acute ITP ([place]) 01/05/2017  . ESRD (end stage renal disease) ([place]) 12/28/2016  . Encounter for removal of sutures 09/09/2016  . Coagulation defect, unspecified ([place]) 09/01/2016  . Underimmunization status 08/05/2016  . Hemolytic anemia ([place]) 07/26/2016  . Epistaxis, recurrent 07/26/2016  . Unspecified protein-calorie malnutrition ([place]) 07/13/2016  . Aftercare including intermittent dialysis ([place]) 07/07/2016  . Anemia in chronic kidney disease 07/07/2016  . Hypokalemia 07/07/2016  . Iron deficiency anemia, unspecified 07/07/2016  . 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 Nonrheumatic mitral (valve) prolapse 07/07/2016  . Other irritable bowel syndrome 07/07/2016  . Other secondary thrombocytopenia 07/07/2016  . Secondary hyperparathyroidism of renal origin ([place]) 07/07/2016  . Thrombocytopenia ([place]) 07/01/2016  . ARF (acute renal failure) ([place]) 06/25/2016  . Anxiety 06/25/2016  . Bilateral carpal tunnel syndrome 10/22/2015  . Chronic gout without tophus 10/22/2015  . Chronic nonintractable headache 10/08/2015  . Fibroid uterus 01/03/2012  . H/O vitamin D deficiency 01/03/2012  . Post-menopausal 01/03/2012  . Hereditary and idiopathic peripheral neuropathy 11/19/2011  . Intestinal malabsorption 11/19/2011  . Lichen planus 31/51/7616  . Low back pain 11/19/2011  . Diffuse spasm of esophagus 11/11/2011  . ESR raised 11/11/2011  . Postinflammatory pulmonary fibrosis ([place])  11/11/2011  . Scleroderma ([place]) 11/16/2010  . Rheumatoid arthritis ([place]) 11/16/2010  . Raynaud's disease 11/16/2010  . Symptomatic menopausal or female climacteric states 11/16/2010    Lanice Shirts 03/22/2020, 4:35 PM  [facility] [address]  [place], [place], [address] Phone: [phone]   Fax:  [phone]  Name: Katie Nunez MRN: [id number] Date of Birth: Nov 02, 1955

## 2020-03-24 NOTE — Telephone Encounter (Signed)
CT head to assess for resolution of prior hemorrhage. MRI is not indicated or needed for this reason

## 2020-03-24 NOTE — Telephone Encounter (Signed)
Pt is asking for a call from RN to discuss why she is not having a MRI versus the CT scan, please call.

## 2020-03-25 ENCOUNTER — Ambulatory Visit
Admission: RE | Admit: 2020-03-25 | Discharge: 2020-03-25 | Disposition: A | Discharge status: Home / Self Care | Payer: Medicare Other | Source: Ambulatory Visit | Attending: Adult Health | Admitting: Adult Health

## 2020-03-25 DIAGNOSIS — I619 Nontraumatic intracerebral hemorrhage, unspecified: Secondary | ICD-10-CM | POA: Diagnosis not present

## 2020-03-26 ENCOUNTER — Other Ambulatory Visit: Payer: Self-pay

## 2020-03-26 ENCOUNTER — Ambulatory Visit: Payer: Medicare Other | Admitting: Occupational Therapy

## 2020-03-26 ENCOUNTER — Ambulatory Visit: Payer: Medicare Other

## 2020-03-26 DIAGNOSIS — R2681 Unsteadiness on feet: Secondary | ICD-10-CM | POA: Diagnosis not present

## 2020-03-26 DIAGNOSIS — R29818 Other symptoms and signs involving the nervous system: Secondary | ICD-10-CM

## 2020-03-26 DIAGNOSIS — M6281 Muscle weakness (generalized): Secondary | ICD-10-CM

## 2020-03-26 DIAGNOSIS — R278 Other lack of coordination: Secondary | ICD-10-CM

## 2020-03-26 NOTE — Therapy (Signed)
[facility] [address] [place], [place], [address] Phone: [phone]   Fax:  [phone]  Physical Therapy Treatment/Re-certification  Patient Details  Name: Katie Nunez MRN: [id number] Date of Birth: 1955-06-10 Referring Provider (PT): [name] MD   Encounter Date: 03/26/2020   Physical Therapy Progress Note   Dates of Reporting Period:02/13/20-03/26/20  See Note below for Objective Data and Assessment of Progress/Goals.     PT End of Session - 03/26/20 1751    Visit Number 12    Number of Visits 16    Date for PT Re-Evaluation 04/13/20    Authorization Type [employer]    PT Start Time 1530    PT Stop Time 1615    PT Time Calculation (min) 45 min    Equipment Utilized During Treatment Gait belt    Activity Tolerance Patient tolerated treatment well    Behavior During Therapy WFL for tasks assessed/performed           Past Medical History:  Diagnosis Date  . Achalasia   . Anxiety   . Dysplasia of cervix, low grade (CIN 1)   . Environmental allergies    "all year long" (12/27/2016)  . ESRD (end stage renal disease) on dialysis [facility])    "TTS; [place]" (12/27/2016)  . Fibromyalgia   . GERD (gastroesophageal reflux disease)   . Gout   . Hypertension   . IBS (irritable bowel syndrome)   . MVP (mitral valve prolapse)   . RA (rheumatoid arthritis) (HCC)    FOLLOWED BY DR. SHANAHAN  . Raynaud's disease   . Scleroderma ([place])   . Seasonal allergies   . Thrombocytopenia ([place]) 07/01/2016   Acute fall to 13,000 07/01/16  . Tubular adenoma 01/08/2008   CECUM  . Vitamin D deficiency     Past Surgical History:  Procedure Laterality Date  . ANKLE FRACTURE SURGERY Right   . AV FISTULA PLACEMENT Left 06/28/2016   Procedure: left arm ARTERIOVENOUS (AV) FISTULA CREATION;  Surgeon: Rosetta Posner, MD;  Location: [place];  Service: Vascular;  Laterality: Left;  . BASCILIC VEIN TRANSPOSITION Left 09/27/2016    Procedure: LEFT UPPER ARM CEPHALIC VEIN TRANSPOSITION;  Surgeon: Rosetta Posner, MD;  Location[place];  Service: Vascular;  Laterality: Left;  . BREAST BIOPSY     "? side"  . CESAREAN SECTION  1994  . CO2 LASER OF CERVIX    . COLONOSCOPY W/ BIOPSIES  01/08/2008  . INSERTION OF DIALYSIS CATHETER Right 06/28/2016   Procedure: INSERTION OF DIALYSIS CATHETER, right internal jugular;  Surgeon: Rosetta Posner, MD;  Location: [place];  Service: Vascular;  Laterality: Right;  . MYOMECTOMY    . NASAL ENDOSCOPY WITH EPISTAXIS CONTROL N/A 12/29/2019   Procedure: NASAL ENDOSCOPY WITH EPISTAXIS CONTROL;  Surgeon: Leta Baptist, MD;  Location: [facility] OR;  Service: ENT;  Laterality: N/A;  . PELVIC LAPAROSCOPY  2011  . superficial thrombophlebitis Left 07-2014    There were no vitals filed for this visit.   Subjective Assessment - 03/26/20 1748    Subjective no falls or LE symptoms to note, cotinues to cite R LE weakness and circumduction as main concerns    Patient is accompained by: Family member    Pertinent History Scleroderma, RA, pulmonary HTN, kidney disease (dialysis) Tu,TH and Sat, Rt ankle fx/dislocation/surgery 2000    Limitations Walking    How long can you sit comfortably? unlimited    How long can you stand comfortably? unlimited  How long can you walk comfortably? unlimited    Patient Stated Goals strengthen ankle and knee R, get off of cane    Currently in Pain? No/denies    Pain Onset More than a month ago                          [facility] Adult PT Treatment/Exercise - 03/26/20 0001      Ambulation/Gait   Ambulation/Gait Yes    Ambulation/Gait Assistance 5: Supervision    Ambulation/Gait Assistance Details focused on correcting cicumduction R LE    Ambulation Distance (Feet) 115 Feet    Assistive device Straight cane    Gait Pattern Step-through pattern    Ambulation Surface Level;Indoor      Knee/Hip Exercises: Stretches   Hip Flexor Stretch Right;3 reps;30 seconds     Hip Flexor Stretch Limitations performed in supine with LRE positioned off of mat table      Knee/Hip Exercises: Standing   Other Standing Knee Exercises hip hiking 1x15 ea. LE standing on Airex mat      Knee/Hip Exercises: Seated   Heel Slides Strengthening;3 sets;10 reps;Right    Heel Slides Limitations against yellow band , foot on towel emphasis on eccentric control    Other Seated Knee/Hip Exercises ant/med/lat stepping onto 4" block to facilitate hip flexion    Other Seated Knee/Hip Exercises IR/ER of R hip against yellow band, sliding foot on towel, 2x10 ea. direction               Balance Exercises - 03/26/20 0001      Balance Exercises: Standing   Step Ups Forward;4 inch;UE support 2;Limitations    Step Ups Limitations stepping onto Airex pad place on top of step               PT Short Term Goals - 03/12/20 1612      PT SHORT TERM GOAL #1   Title Ind with initial HEP and able to demo back to PT w/o need of VCs    Baseline no HEP; 03/12/20 demos HEP w/o need of VCs    Time 2    Period Weeks    Status Achieved    Target Date 02/27/20      PT SHORT TERM GOAL #2   Title Patient to obtain [facility] and bring it to PT sessions for adjustment and training    Baseline not using cane prior; 03/12/20 Has been using cane in clinic, adjusted to her body habitus    Time 2    Period Weeks    Status Achieved    Target Date 02/27/20             PT Long Term Goals - 03/26/20 1816      PT LONG TERM GOAL #1   Title Patient to improve 5x STS score to < 20 sec    Baseline 24 seconds; NT    Time 8    Period Weeks    Status On-going    Target Date 04/23/20      PT LONG TERM GOAL #2   Title Patient to demo 4+/5 R ankle/knee strength throughout    Baseline 4/5 R ankle knee strength; 03/26/20 No changes    Time 8    Period Weeks    Status On-going    Target Date 04/23/20      PT LONG TERM GOAL #3   Title Patient to decerease TUG time to ,13.5 sec  w/o AD    Baseline 18  sec TUG average of 2 trials; NT    Time 8    Period Weeks    Status On-going    Target Date 04/23/20      PT LONG TERM GOAL #4   Title Ambulate 1030f with LRAD across outdoor surfaces under distant S    Baseline limited to in home ambulation; Ambulation limitd to 2028foutside with [facility] S    Time 8    Period Weeks    Status On-going    Target Date 04/23/20      PT LONG TERM GOAL #5   Title Return to driving as appropriate    Baseline currently not driving; 03/14/22/40o change    Time 8    Period Weeks    Status On-going    Target Date 04/23/20                 Plan - 03/26/20 1752    Clinical Impression Statement Skilled purpose of session was B hip strengthening to facilitate L hip flexion with functional mobility tasks and ambulation, added IR/ER strengthening against t-band to promote hip stability and deter cicumductio with gait, adde stepping tasks onto non-compliant surfaces to challenge ankle proprioception and balance    Personal Factors and Comorbidities Age;Comorbidity 2    Comorbidities ESRD(HD T, Th, Sat), R ankle fx 2000    Examination-Participation Restrictions Community Activity    Stability/Clinical Decision Making Stable/Uncomplicated    Rehab Potential Good    PT Frequency 2x / week    PT Duration 4 weeks    PT Treatment/Interventions ADLs/Self Care Home Management;Cryotherapy;Electrical Stimulation;Ultrasound;Therapeutic exercise;Balance training;Patient/family education;Manual techniques;Dry needling;Therapeutic activities;Neuromuscular re-education;DME Instruction;Gait training;Stair training;Functional mobility training;Orthotic Fit/Training    PT Next Visit Plan recert for an additional 4 weeks of PT    PT Home Exercise Plan heel/toe raises, sidestepping against green band, retrowalikng    Consulted and Agree with Plan of Care Patient           Patient will benefit from skilled therapeutic intervention in order to improve the following deficits  and impairments:  Postural dysfunction,Decreased balance,Impaired flexibility,Abnormal gait,Decreased mobility,Difficulty walking,Decreased coordination  Visit Diagnosis: Unsteadiness on feet  Muscle weakness (generalized)     Problem List Patient Active Problem List   Diagnosis Date Noted  . Abnormality of gait 02/25/2020  . Protein-calorie malnutrition, severe 01/19/2020  . Epistaxis   . Sleep disturbance   . Slow transit constipation   . Hemorrhoids   . Chronic systolic congestive heart failure (HCCromwell  . ESRD on dialysis (HCDupree  . Right hemiparesis (HCMeyers Lake  . Pressure injury of skin 12/20/2019  . Intraparenchymal hemorrhage of brain (HCBryant12/07/2019  . ICH (intracerebral hemorrhage) (HCMoultrie12/01/2019  . Viral disease 08/22/2019  . Chronic right-sided heart failure (HCClarkedale06/28/2021  . Supraventricular tachycardia (HCCambria06/28/2021  . Other cirrhosis of liver (HCHaines06/16/2021  . Heart failure (HCSturtevant02/10/2019  . Heart palpitations 08/14/2018  . Other fluid overload 07/27/2018  . Encounter for long-term (current) use of other medications 01/18/2018  . Hypothyroidism 01/18/2018  . Myalgia and myositis 01/18/2018  . Chronic nephritis 01/18/2018  . Other interstitial pulmonary diseases with fibrosis in diseases classified elsewhere (HCHickory01/08/2018  . Other long term (current) drug therapy 01/18/2018  . Sleep apnea 01/18/2018  . Unspecified persistent mental disorders due to conditions classified elsewhere 01/18/2018  . Venous reflux 01/18/2018  . Vitamin B12 deficiency 01/18/2018  . Vitamin D deficiency 01/18/2018  .  Obstructive lung disease ([place]) 12/30/2017  . Other pruritus 12/27/2017  . Eruption cyst 12/19/2017  . 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 ([place]) 11/28/2017  . Chronic cough 11/11/2017  . Pulmonary hypertension ([place]) 11/11/2017  . Pulmonary arterial hypertension ([place]) 10/07/2017  . Acute ITP ([place]) 01/05/2017  . ESRD (end  stage renal disease) ([place]) 12/28/2016  . Encounter for removal of sutures 09/09/2016  . Coagulation defect, unspecified ([place]) 09/01/2016  . Underimmunization status 08/05/2016  . Hemolytic anemia ([place]) 07/26/2016  . Epistaxis, recurrent 07/26/2016  . Unspecified protein-calorie malnutrition ([place]) 07/13/2016  . Aftercare including intermittent dialysis ([place]) 07/07/2016  . Anemia in chronic kidney disease 07/07/2016  . Hypokalemia 07/07/2016  . Iron deficiency anemia, unspecified 07/07/2016  . 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 Nonrheumatic mitral (valve) prolapse 07/07/2016  . Other irritable bowel syndrome 07/07/2016  . Other secondary thrombocytopenia 07/07/2016  . Secondary hyperparathyroidism of renal origin ([place]) 07/07/2016  . Thrombocytopenia ([place]) 07/01/2016  . ARF (acute renal failure) ([place]) 06/25/2016  . Anxiety 06/25/2016  . Bilateral carpal tunnel syndrome 10/22/2015  . Chronic gout without tophus 10/22/2015  . Chronic nonintractable headache 10/08/2015  . Fibroid uterus 01/03/2012  . H/O vitamin D deficiency 01/03/2012  . Post-menopausal 01/03/2012  . Hereditary and idiopathic peripheral neuropathy 11/19/2011  . Intestinal malabsorption 11/19/2011  . Lichen planus 51/10/2109  . Low back pain 11/19/2011  . Diffuse spasm of esophagus 11/11/2011  . ESR raised 11/11/2011  . Postinflammatory pulmonary fibrosis ([place]) 11/11/2011  . Scleroderma ([place]) 11/16/2010  . Rheumatoid arthritis ([place]) 11/16/2010  . Raynaud's disease 11/16/2010  . Symptomatic menopausal or female climacteric states 11/16/2010       Lanice Shirts PT 03/26/2020, 6:23 PM  [facility] [address] [place], [place], [address] Phone: [phone]   Fax:  [phone]  Name: AVONLEA SIMA MRN: [id number] Date of Birth: [birth year]

## 2020-03-26 NOTE — Therapy (Signed)
[facility] [address] [place], [place], [address] Phone: [phone]   Fax:  [phone]  Occupational Therapy Treatment  Patient Details  Name: Katie Nunez MRN: [id number] Date of Birth: 01-01-1956 Referring Provider (OT): Molli Barrows   Encounter Date: 03/26/2020   OT End of Session - 03/26/20 1542    Visit Number 12    Number of Visits 17    Date for OT Re-Evaluation 04/09/20    Authorization Time Period 90 day cert    Authorization - Visit Number 12    Progress Note Due on Visit 20    OT Start Time 1505    OT Stop Time 1530    OT Time Calculation (min) 25 min    Activity Tolerance Patient tolerated treatment well    Behavior During Therapy [facility] for tasks assessed/performed           Past Medical History:  Diagnosis Date  . Achalasia   . Anxiety   . Dysplasia of cervix, low grade (CIN 1)   . Environmental allergies    "all year long" (12/27/2016)  . ESRD (end stage renal disease) on dialysis [facility])    "TTS; [place]" (12/27/2016)  . Fibromyalgia   . GERD (gastroesophageal reflux disease)   . Gout   . Hypertension   . IBS (irritable bowel syndrome)   . MVP (mitral valve prolapse)   . RA (rheumatoid arthritis) (HCC)    FOLLOWED BY DR. SHANAHAN  . Raynaud's disease   . Scleroderma ([place])   . Seasonal allergies   . Thrombocytopenia ([place]) 07/01/2016   Acute fall to 13,000 07/01/16  . Tubular adenoma 01/08/2008   CECUM  . Vitamin D deficiency     Past Surgical History:  Procedure Laterality Date  . ANKLE FRACTURE SURGERY Right   . AV FISTULA PLACEMENT Left 06/28/2016   Procedure: left arm ARTERIOVENOUS (AV) FISTULA CREATION;  Surgeon: Rosetta Posner, MD;  Location: [place];  Service: Vascular;  Laterality: Left;  . BASCILIC VEIN TRANSPOSITION Left 09/27/2016   Procedure: LEFT UPPER ARM CEPHALIC VEIN TRANSPOSITION;  Surgeon: Rosetta Posner, MD;  Location: [place];  Service: Vascular;  Laterality:  Left;  . BREAST BIOPSY     "? side"  . CESAREAN SECTION  1994  . CO2 LASER OF CERVIX    . COLONOSCOPY W/ BIOPSIES  01/08/2008  . INSERTION OF DIALYSIS CATHETER Right 06/28/2016   Procedure: INSERTION OF DIALYSIS CATHETER, right internal jugular;  Surgeon: Rosetta Posner, MD;  Location: [place];  Service: Vascular;  Laterality: Right;  . MYOMECTOMY    . NASAL ENDOSCOPY WITH EPISTAXIS CONTROL N/A 12/29/2019   Procedure: NASAL ENDOSCOPY WITH EPISTAXIS CONTROL;  Surgeon: Leta Baptist, MD;  Location: [facility] OR;  Service: ENT;  Laterality: N/A;  . PELVIC LAPAROSCOPY  2011  . superficial thrombophlebitis Left 07-2014    There were no vitals filed for this visit.   Subjective Assessment - 03/26/20 1541    Subjective  mild shoulder pain    Pertinent History Scleroderma, RA,HTN, ESRD (dialysis), hx R ankle Fx, 2000, anxiety,fibromyalgia, gout, HTN, MVP, Raynaud's  disease, C section, ostructive lung disease, B CTS, hereditary and periferal neuropathy, venous reflux, ICH    Currently in Pain? Yes    Pain Score 3     Pain Location Shoulder    Pain Orientation Right    Pain Descriptors / Indicators Aching    Pain Type Acute pain    Pain Onset More  than a month ago    Pain Frequency Intermittent    Aggravating Factors  reaching    Pain Relieving Factors rest, repositioning                  Treatment: Wall slides for gentle stretch followed by wall pushups, pt fatigued very quickly, min-mod v.c Closed chain shoulder flexion low range, chest press at navel, shoulder extension and abduction all within pain free ROM, mod v.c for upright posture and proper positioning Handwriting with coban wrapped pen, grossly 90% legible.                OT Short Term Goals - 03/19/20 1534      OT SHORT TERM GOAL #1   Title I with inital HEP.    Time 4    Period Weeks    Status On-going      OT SHORT TERM GOAL #2   Title Pt will demonstrate improved fine motor coordination for ADLs as evidenced  by decreasing 9 hole peg test score for RUE to 52 secs or less    Baseline RUE 60.56, LUE 32.81    Time 4    Period Weeks    Status Achieved   RUE 43.84s     OT SHORT TERM GOAL #3   Title Pt will perform light home management/simple cooking tasks with supervision and min v.c    Time 4    Period Weeks    Status Achieved      OT SHORT TERM GOAL #4   Title Pt will demonstrate improved RUE functional use as eveidenced by increasing box/ blocks score to 25 blocks with RUE.    Time 4    Period Weeks    Status Achieved   RUE 30 blocks on 03/14/2020     OT SHORT TERM GOAL #5   Title Pt will write a short paragraph with at least 90% legibility.    Time 4    Period Weeks    Status On-going   75% accuracy legibility for 03/19/20            OT Long Term Goals - 03/14/20 1416      OT LONG TERM GOAL #1   Title I with updated HEP.-04/09/20    Time 8    Period Weeks    Status New      OT LONG TERM GOAL #2   Title Pt will perform basic home managment/ cooking modified Independently    Time 8    Period Weeks    Status New      OT LONG TERM GOAL #3   Title Pt will demonstrate improved fine motor coordination for ADLs as evidenced by decreasing 9 hole peg test score for RUE to 45 secs or less.    Time 8    Period Weeks    Status Achieved   43.84s RUE on 03/14/20     OT LONG TERM GOAL #4   Title Pt will write a short paragraph with 100% legibility.    Time 8    Period Weeks    Status New      OT LONG TERM GOAL #5   Title Pt will demonstrate ability to retrieve a lightweight object at 125 shoulder flexion with RUE demonstrating good control.    Time 8    Period Weeks    Status New                 Plan - 03/26/20 1544  Clinical Impression Statement Pt is progressing slowly towards goals. She is limited by shoulder pain. Pt continues to require v.c for upright posture.    OT Occupational Profile and History Detailed Assessment- Review of Records and additional review of  physical, cognitive, psychosocial history related to current functional performance    Occupational performance deficits (Please refer to evaluation for details): ADL's;IADL's;Leisure;Rest and Sleep;Social Participation;Other    Body Structure / Function / Physical Skills ADL;Endurance;Decreased knowledge of precautions;Decreased knowledge of use of DME;Flexibility;IADL;Pain;Skin integrity;Edema;Mobility;ROM    Rehab Potential Good    OT Frequency 2x / week    OT Duration 8 weeks    OT Treatment/Interventions Self-care/ADL training;Therapeutic exercise;DME and/or AE instruction;Therapist, nutritional;Compression bandaging;Other (comment);Manual Therapy;Scar mobilization;Coping strategies training;Energy conservation;Manual lymph drainage;Passive range of motion;Therapeutic activities    Plan issue low range standing shoulder flexion, chest press at navel, shoulder extension and abduction with cane in pain free ROM for HEP, continue to address posture    Consulted and Agree with Plan of Care Patient           Patient will benefit from skilled therapeutic intervention in order to improve the following deficits and impairments:   Body Structure / Function / Physical Skills: ADL,Endurance,Decreased knowledge of precautions,Decreased knowledge of use of DME,Flexibility,IADL,Pain,Skin integrity,Edema,Mobility,ROM       Visit Diagnosis: Muscle weakness (generalized)  Other lack of coordination  Other symptoms and signs involving the nervous system  Unsteadiness on feet    Problem List Patient Active Problem List   Diagnosis Date Noted  . Abnormality of gait 02/25/2020  . Protein-calorie malnutrition, severe 01/19/2020  . Epistaxis   . Sleep disturbance   . Slow transit constipation   . Hemorrhoids   . Chronic systolic congestive heart failure ([place])   . ESRD on dialysis ([place])   . Right hemiparesis ([place])   . Pressure injury of skin 12/20/2019  . Intraparenchymal hemorrhage of  brain ([place]) 12/18/2019  . ICH (intracerebral hemorrhage) ([place]) 12/12/2019  . Viral disease 08/22/2019  . Chronic right-sided heart failure ([place]) 07/09/2019  . Supraventricular tachycardia ([place]) 07/09/2019  . Other cirrhosis of liver ([place]) 06/27/2019  . Heart failure ([place]) 02/21/2019  . Heart palpitations 08/14/2018  . Other fluid overload 07/27/2018  . Encounter for long-term (current) use of other medications 01/18/2018  . Hypothyroidism 01/18/2018  . Myalgia and myositis 01/18/2018  . Chronic nephritis 01/18/2018  . Other interstitial pulmonary diseases with fibrosis in diseases classified elsewhere ([place]) 01/18/2018  . Other long term (current) drug therapy 01/18/2018  . Sleep apnea 01/18/2018  . Unspecified persistent mental disorders due to conditions classified elsewhere 01/18/2018  . Venous reflux 01/18/2018  . Vitamin B12 deficiency 01/18/2018  . Vitamin D deficiency 01/18/2018  . Obstructive lung disease ([place]) 12/30/2017  . Other pruritus 12/27/2017  . Eruption cyst 12/19/2017  . 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 Chronic cough 11/11/2017  . Pulmonary hypertension ([place]) 11/11/2017  . Pulmonary arterial hypertension ([place]) 10/07/2017  . Acute ITP ([place]) 01/05/2017  . ESRD (end stage renal disease) ([place]) 12/28/2016  . Encounter for removal of sutures 09/09/2016  . Coagulation defect, unspecified ([place]) 09/01/2016  . Underimmunization status 08/05/2016  . Hemolytic anemia ([place]) 07/26/2016  . Epistaxis, recurrent 07/26/2016  . Unspecified protein-calorie malnutrition ([place]) 07/13/2016  . Aftercare including intermittent dialysis ([place]) 07/07/2016  . Anemia in chronic kidney disease 07/07/2016  . Hypokalemia 07/07/2016  . Iron deficiency anemia, unspecified 07/07/2016  . 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 Nonrheumatic  mitral (valve) prolapse 07/07/2016  . Other irritable bowel syndrome 07/07/2016  . Other secondary  thrombocytopenia 07/07/2016  . Secondary hyperparathyroidism of renal origin ([place]) 07/07/2016  . Thrombocytopenia ([place]) 07/01/2016  . ARF (acute renal failure) ([place]) 06/25/2016  . Anxiety 06/25/2016  . Bilateral carpal tunnel syndrome 10/22/2015  . Chronic gout without tophus 10/22/2015  . Chronic nonintractable headache 10/08/2015  . Fibroid uterus 01/03/2012  . H/O vitamin D deficiency 01/03/2012  . Post-menopausal 01/03/2012  . Hereditary and idiopathic peripheral neuropathy 11/19/2011  . Intestinal malabsorption 11/19/2011  . Lichen planus 70/35/0093  . Low back pain 11/19/2011  . Diffuse spasm of esophagus 11/11/2011  . ESR raised 11/11/2011  . Postinflammatory pulmonary fibrosis ([place]) 11/11/2011  . Scleroderma ([place]) 11/16/2010  . Rheumatoid arthritis ([place]) 11/16/2010  . Raynaud's disease 11/16/2010  . Symptomatic menopausal or female climacteric states 11/16/2010    , 03/26/2020, 3:46 PM  [facility] [address] [place], [place], [address] Phone: [phone]   Fax:  [phone]  Name: TROYCE FEBO MRN: [id number] Date of Birth: 11-Feb-1955

## 2020-03-28 ENCOUNTER — Ambulatory Visit: Payer: Medicare Other

## 2020-03-28 ENCOUNTER — Encounter: Payer: Self-pay | Admitting: Occupational Therapy

## 2020-03-28 ENCOUNTER — Other Ambulatory Visit: Payer: Self-pay

## 2020-03-28 ENCOUNTER — Ambulatory Visit: Payer: Medicare Other | Admitting: Occupational Therapy

## 2020-03-28 DIAGNOSIS — M6281 Muscle weakness (generalized): Secondary | ICD-10-CM

## 2020-03-28 DIAGNOSIS — R278 Other lack of coordination: Secondary | ICD-10-CM

## 2020-03-28 DIAGNOSIS — R2681 Unsteadiness on feet: Secondary | ICD-10-CM | POA: Diagnosis not present

## 2020-03-28 NOTE — Therapy (Signed)
[facility] [address] [place], [place], [address] Phone: [phone]   Fax:  [phone]  Occupational Therapy Treatment  Patient Details  Name: Katie Nunez MRN: [id number] Date of Birth: 1955-04-10 Referring Provider (OT): Molli Barrows   Encounter Date: 03/28/2020   OT End of Session - 03/28/20 1447    Visit Number 13    Number of Visits 17    Date for OT Re-Evaluation 04/09/20    Authorization Time Period 90 day cert    Authorization - Visit Number 13    Progress Note Due on Visit 20    OT Start Time 1447    OT Stop Time 1528    OT Time Calculation (min) 41 min    Activity Tolerance Patient tolerated treatment well    Behavior During Therapy [facility] for tasks assessed/performed           Past Medical History:  Diagnosis Date  . Achalasia   . Anxiety   . Dysplasia of cervix, low grade (CIN 1)   . Environmental allergies    "all year long" (12/27/2016)  . ESRD (end stage renal disease) on dialysis [facility])    "TTS; [place]" (12/27/2016)  . Fibromyalgia   . GERD (gastroesophageal reflux disease)   . Gout   . Hypertension   . IBS (irritable bowel syndrome)   . MVP (mitral valve prolapse)   . RA (rheumatoid arthritis) (HCC)    FOLLOWED BY DR. SHANAHAN  . Raynaud's disease   . Scleroderma ([place])   . Seasonal allergies   . Thrombocytopenia ([place]) 07/01/2016   Acute fall to 13,000 07/01/16  . Tubular adenoma 01/08/2008   CECUM  . Vitamin D deficiency     Past Surgical History:  Procedure Laterality Date  . ANKLE FRACTURE SURGERY Right   . AV FISTULA PLACEMENT Left 06/28/2016   Procedure: left arm ARTERIOVENOUS (AV) FISTULA CREATION;  Surgeon: Rosetta Posner, MD;  Location: [place];  Service: Vascular;  Laterality: Left;  . BASCILIC VEIN TRANSPOSITION Left 09/27/2016   Procedure: LEFT UPPER ARM CEPHALIC VEIN TRANSPOSITION;  Surgeon: Rosetta Posner, MD;  Location: [place];  Service: Vascular;  Laterality:  Left;  . BREAST BIOPSY     "? side"  . CESAREAN SECTION  1994  . CO2 LASER OF CERVIX    . COLONOSCOPY W/ BIOPSIES  01/08/2008  . INSERTION OF DIALYSIS CATHETER Right 06/28/2016   Procedure: INSERTION OF DIALYSIS CATHETER, right internal jugular;  Surgeon: Rosetta Posner, MD;  Location: [place];  Service: Vascular;  Laterality: Right;  . MYOMECTOMY    . NASAL ENDOSCOPY WITH EPISTAXIS CONTROL N/A 12/29/2019   Procedure: NASAL ENDOSCOPY WITH EPISTAXIS CONTROL;  Surgeon: Leta Baptist, MD;  Location: [facility] OR;  Service: ENT;  Laterality: N/A;  . PELVIC LAPAROSCOPY  2011  . superficial thrombophlebitis Left 07-2014    There were no vitals filed for this visit.   Subjective Assessment - 03/28/20 1447    Subjective  i don't feel great but i don't feel bad either.    Pertinent History Scleroderma, RA,HTN, ESRD (dialysis), hx R ankle Fx, 2000, anxiety,fibromyalgia, gout, HTN, MVP, Raynaud's  disease, C section, ostructive lung disease, B CTS, hereditary and periferal neuropathy, venous reflux, ICH    Currently in Pain? Yes    Pain Score 7     Pain Location Shoulder    Pain Orientation Right    Pain Descriptors / Indicators Aching    Pain Type Acute  pain    Pain Onset More than a month ago    Pain Frequency Intermittent              Treatment:  Addressed posture with foam roller at lumbar for facilitating thoracic extension.   Brewing technologist with RUE and reaching to varying heights and placing on poles. Pt reqd mod verbal cues for positioning and posture for upright sitting. Pt fatigued quickly.   Standing Cane Exercises - low range. Shoulder flexion, chest press to belly button, abduction, shoulder extension x 10 reps. Issued as HEP - see pt instructions.   Handwriting - practicing signature and increasing legibility of ABC. Word finding for categorization of animals. Grossly 80% legibility with written words.                 OT Education - 03/28/20 1515    Education Details  standing cane exercises - low range. see pt instructions    Person(s) Educated Patient    Methods Explanation;Demonstration;Handout    Comprehension Verbalized understanding;Returned demonstration;Verbal cues required            OT Short Term Goals - 03/19/20 1534      OT SHORT TERM GOAL #1   Title I with inital HEP.    Time 4    Period Weeks    Status On-going      OT SHORT TERM GOAL #2   Title Pt will demonstrate improved fine motor coordination for ADLs as evidenced by decreasing 9 hole peg test score for RUE to 52 secs or less    Baseline RUE 60.56, LUE 32.81    Time 4    Period Weeks    Status Achieved   RUE 43.84s     OT SHORT TERM GOAL #3   Title Pt will perform light home management/simple cooking tasks with supervision and min v.c    Time 4    Period Weeks    Status Achieved      OT SHORT TERM GOAL #4   Title Pt will demonstrate improved RUE functional use as eveidenced by increasing box/ blocks score to 25 blocks with RUE.    Time 4    Period Weeks    Status Achieved   RUE 30 blocks on 03/14/2020     OT SHORT TERM GOAL #5   Title Pt will write a short paragraph with at least 90% legibility.    Time 4    Period Weeks    Status On-going   75% accuracy legibility for 03/19/20            OT Long Term Goals - 03/14/20 1416      OT LONG TERM GOAL #1   Title I with updated HEP.-04/09/20    Time 8    Period Weeks    Status New      OT LONG TERM GOAL #2   Title Pt will perform basic home managment/ cooking modified Independently    Time 8    Period Weeks    Status New      OT LONG TERM GOAL #3   Title Pt will demonstrate improved fine motor coordination for ADLs as evidenced by decreasing 9 hole peg test score for RUE to 45 secs or less.    Time 8    Period Weeks    Status Achieved   43.84s RUE on 03/14/20     OT LONG TERM GOAL #4   Title Pt will write a short paragraph with 100%  legibility.    Time 8    Period Weeks    Status New      OT LONG TERM  GOAL #5   Title Pt will demonstrate ability to retrieve a lightweight object at 125 shoulder flexion with RUE demonstrating good control.    Time 8    Period Weeks    Status New                 Plan - 03/28/20 1524    Clinical Impression Statement Pt continues to have rounded shoulders with upright sitting posture.    OT Occupational Profile and History Detailed Assessment- Review of Records and additional review of physical, cognitive, psychosocial history related to current functional performance    Occupational performance deficits (Please refer to evaluation for details): ADL's;IADL's;Leisure;Rest and Sleep;Social Participation;Other    Body Structure / Function / Physical Skills ADL;Endurance;Decreased knowledge of precautions;Decreased knowledge of use of DME;Flexibility;IADL;Pain;Skin integrity;Edema;Mobility;ROM    Rehab Potential Good    OT Frequency 2x / week    OT Duration 8 weeks    OT Treatment/Interventions Self-care/ADL training;Therapeutic exercise;DME and/or AE instruction;Therapist, nutritional;Compression bandaging;Other (comment);Manual Therapy;Scar mobilization;Coping strategies training;Energy conservation;Manual lymph drainage;Passive range of motion;Therapeutic activities    Plan continue to address posture    Consulted and Agree with Plan of Care Patient           Patient will benefit from skilled therapeutic intervention in order to improve the following deficits and impairments:   Body Structure / Function / Physical Skills: ADL,Endurance,Decreased knowledge of precautions,Decreased knowledge of use of DME,Flexibility,IADL,Pain,Skin integrity,Edema,Mobility,ROM       Visit Diagnosis: Muscle weakness (generalized)  Other lack of coordination  Unsteadiness on feet    Problem List Patient Active Problem List   Diagnosis Date Noted  . Abnormality of gait 02/25/2020  . Protein-calorie malnutrition, severe 01/19/2020  . Epistaxis   .  Sleep disturbance   . Slow transit constipation   . Hemorrhoids   . Chronic systolic congestive heart failure ([place])   . ESRD on dialysis ([place])   . Right hemiparesis ([place])   . Pressure injury of skin 12/20/2019  . Intraparenchymal hemorrhage of brain ([place]) 12/18/2019  . ICH (intracerebral hemorrhage) ([place]) 12/12/2019  . Viral disease 08/22/2019  . Chronic right-sided heart failure ([place]) 07/09/2019  . Supraventricular tachycardia ([place]) 07/09/2019  . Other cirrhosis of liver ([place]) 06/27/2019  . Heart failure ([place]) 02/21/2019  . Heart palpitations 08/14/2018  . Other fluid overload 07/27/2018  . Encounter for long-term (current) use of other medications 01/18/2018  . Hypothyroidism 01/18/2018  . Myalgia and myositis 01/18/2018  . Chronic nephritis 01/18/2018  . Other interstitial pulmonary diseases with fibrosis in diseases classified elsewhere ([place]) 01/18/2018  . Other long term (current) drug therapy 01/18/2018  . Sleep apnea 01/18/2018  . Unspecified persistent mental disorders due to conditions classified elsewhere 01/18/2018  . Venous reflux 01/18/2018  . Vitamin B12 deficiency 01/18/2018  . Vitamin D deficiency 01/18/2018  . Obstructive lung disease ([place]) 12/30/2017  . Other pruritus 12/27/2017  . Eruption cyst 12/19/2017  . 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 ([place]) 11/28/2017  . Chronic cough 11/11/2017  . Pulmonary hypertension ([place]) 11/11/2017  . Pulmonary arterial hypertension ([place]) 10/07/2017  . Acute ITP ([place]) 01/05/2017  . ESRD (end stage renal disease) ([place]) 12/28/2016  . Encounter for removal of sutures 09/09/2016  . Coagulation defect, unspecified ([place]) 09/01/2016  . Underimmunization status 08/05/2016  . Hemolytic anemia ([place]) 07/26/2016  . Epistaxis,  recurrent 07/26/2016  . Unspecified protein-calorie malnutrition ([place]) 07/13/2016  . Aftercare including intermittent dialysis ([place]) 07/07/2016  . Anemia in chronic kidney  disease 07/07/2016  . Hypokalemia 07/07/2016  . Iron deficiency anemia, unspecified 07/07/2016  . 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 Nonrheumatic mitral (valve) prolapse 07/07/2016  . Other irritable bowel syndrome 07/07/2016  . Other secondary thrombocytopenia 07/07/2016  . Secondary hyperparathyroidism of renal origin ([place]) 07/07/2016  . Thrombocytopenia ([place]) 07/01/2016  . ARF (acute renal failure) ([place]) 06/25/2016  . Anxiety 06/25/2016  . Bilateral carpal tunnel syndrome 10/22/2015  . Chronic gout without tophus 10/22/2015  . Chronic nonintractable headache 10/08/2015  . Fibroid uterus 01/03/2012  . H/O vitamin D deficiency 01/03/2012  . Post-menopausal 01/03/2012  . Hereditary and idiopathic peripheral neuropathy 11/19/2011  . Intestinal malabsorption 11/19/2011  . Lichen planus 11/04/8525  . Low back pain 11/19/2011  . Diffuse spasm of esophagus 11/11/2011  . ESR raised 11/11/2011  . Postinflammatory pulmonary fibrosis ([place]) 11/11/2011  . Scleroderma ([place]) 11/16/2010  . Rheumatoid arthritis ([place]) 11/16/2010  . Raynaud's disease 11/16/2010  . Symptomatic menopausal or female climacteric states 11/16/2010    Zachery Conch MOT, OTR/L  03/28/2020, 3:31 PM  [facility] [address] [place], [place], [address] Phone: [phone]   Fax:  [phone]  Name: RAHCEL SHUTES MRN: [id number] Date of Birth: [birth year]

## 2020-03-28 NOTE — Patient Instructions (Signed)
Complete _2-3_ sets of _10-12__ repetitions of each exercises - Use your cane.  Cane Majorette - Standing    Holding cane majorette style down across trunk, raise toward ceiling. Repeat ___ times. Reverse hand placement, repeat to other side. Do ___ times per day.  Cane Overhead - Standing    With arms straight, hold cane forward at waist. Raise cane above head. Hold ___ seconds. Repeat ___ times. Do ___ times per day.  Cane Exercise: Extension    Stand holding cane behind back with both hands palm-up. Lift the cane away from body. Hold ____ seconds. Repeat ____ times. Do ____ sessions per day.  Supine: Chest Press (Active)    In standing - with arms fully extended. Bring bar or dowel slowly to belly button and press to arm's length.  Complete _2-3_ sets of _10-12__ repetitions.   Copyright  VHI. All rights reserved.   Copyright  VHI. All rights reserved.

## 2020-03-29 NOTE — Therapy (Signed)
[facility] [address] [place], [place], [address] Phone: [phone]   Fax:  [phone]  Physical Therapy Treatment  Patient Details  Name: Katie Nunez MRN: [id number] Date of Birth: [birth year] Referring Provider (PT): [name] MD   Encounter Date: 03/28/2020   PT End of Session - 03/28/20 1420    Visit Number 13    Number of Visits 16    Date for PT Re-Evaluation 04/13/20    Authorization Type [employer]    PT Start Time 1400    PT Stop Time 1445    PT Time Calculation (min) 45 min    Equipment Utilized During Treatment Gait belt    Activity Tolerance Patient tolerated treatment well    Behavior During Therapy [facility] for tasks assessed/performed           Past Medical History:  Diagnosis Date  . Achalasia   . Anxiety   . Dysplasia of cervix, low grade (CIN 1)   . Environmental allergies    "all year long" (12/27/2016)  . ESRD (end stage renal disease) on dialysis [facility])    "TTS; [place]" (12/27/2016)  . Fibromyalgia   . GERD (gastroesophageal reflux disease)   . Gout   . Hypertension   . IBS (irritable bowel syndrome)   . MVP (mitral valve prolapse)   . RA (rheumatoid arthritis) (HCC)    FOLLOWED BY DR. SHANAHAN  . Raynaud's disease   . Scleroderma ([place])   . Seasonal allergies   . Thrombocytopenia ([place]) 07/01/2016   Acute fall to 13,000 07/01/16  . Tubular adenoma 01/08/2008   CECUM  . Vitamin D deficiency     Past Surgical History:  Procedure Laterality Date  . ANKLE FRACTURE SURGERY Right   . AV FISTULA PLACEMENT Left 06/28/2016   Procedure: left arm ARTERIOVENOUS (AV) FISTULA CREATION;  Surgeon: Rosetta Posner, MD;  Location: [place];  Service: Vascular;  Laterality: Left;  . BASCILIC VEIN TRANSPOSITION Left 09/27/2016   Procedure: LEFT UPPER ARM CEPHALIC VEIN TRANSPOSITION;  Surgeon: Rosetta Posner, MD;  Location: [place];  Service: Vascular;  Laterality: Left;  . BREAST BIOPSY     "?  side"  . CESAREAN SECTION  1994  . CO2 LASER OF CERVIX    . COLONOSCOPY W/ BIOPSIES  01/08/2008  . INSERTION OF DIALYSIS CATHETER Right 06/28/2016   Procedure: INSERTION OF DIALYSIS CATHETER, right internal jugular;  Surgeon: Rosetta Posner, MD;  Location: [place];  Service: Vascular;  Laterality: Right;  . MYOMECTOMY    . NASAL ENDOSCOPY WITH EPISTAXIS CONTROL N/A 12/29/2019   Procedure: NASAL ENDOSCOPY WITH EPISTAXIS CONTROL;  Surgeon: Leta Baptist, MD;  Location[facility] OR;  Service: ENT;  Laterality: N/A;  . PELVIC LAPAROSCOPY  2011  . superficial thrombophlebitis Left 07-2014    There were no vitals filed for this visit.                      [facility] Adult PT Treatment/Exercise - 03/29/20 0001      Ambulation/Gait   Ambulation/Gait Yes    Ambulation/Gait Assistance 5: Supervision    Ambulation/Gait Assistance Details cued for R hip flexion    Ambulation Distance (Feet) 500 Feet    Assistive device Straight cane    Gait Pattern Step-through pattern    Ambulation Surface Level;Unlevel;Indoor;Outdoor    Gait Comments 1 rest break needed      Knee/Hip Exercises: Standing   Other Standing Knee Exercises  runner step from 4"step using target for knee flexion 15 reps on R LE    Other Standing Knee Exercises RLE kicking from 4" block 15x2                    PT Short Term Goals - 03/12/20 1612      PT SHORT TERM GOAL #1   Title Ind with initial HEP and able to demo back to PT w/o need of VCs    Baseline no HEP; 03/12/20 demos HEP w/o need of VCs    Time 2    Period Weeks    Status Achieved    Target Date 02/27/20      PT SHORT TERM GOAL #2   Title Patient to obtain [facility] and bring it to PT sessions for adjustment and training    Baseline not using cane prior; 03/12/20 Has been using cane in clinic, adjusted to her body habitus    Time 2    Period Weeks    Status Achieved    Target Date 02/27/20             PT Long Term Goals - 03/26/20 1816      PT LONG  TERM GOAL #1   Title Patient to improve 5x STS score to < 20 sec    Baseline 24 seconds; NT    Time 8    Period Weeks    Status On-going    Target Date 04/23/20      PT LONG TERM GOAL #2   Title Patient to demo 4+/5 R ankle/knee strength throughout    Baseline 4/5 R ankle knee strength; 03/26/20 No changes    Time 8    Period Weeks    Status On-going    Target Date 04/23/20      PT LONG TERM GOAL #3   Title Patient to decerease TUG time to ,13.5 sec w/o AD    Baseline 18 sec TUG average of 2 trials; NT    Time 8    Period Weeks    Status On-going    Target Date 04/23/20      PT LONG TERM GOAL #4   Title Ambulate 1038f with LRAD across outdoor surfaces under distant S    Baseline limited to in home ambulation; Ambulation limitd to 2072foutside with SPBerkshire Eye LLCnd S    Time 8    Period Weeks    Status On-going    Target Date 04/23/20      PT LONG TERM GOAL #5   Title Return to driving as appropriate    Baseline currently not driving; 03/12/42/81o change    Time 8    Period Weeks    Status On-going    Target Date 04/23/20                 Plan - 03/29/20 1422    Clinical Impression Statement Focus of skilled session was continued review of R brace operation and application, gait training across outside surfaces and continued sterngthening of R hip/knee to promote swing through during gait    Personal Factors and Comorbidities Age;Comorbidity 2    Comorbidities ESRD(HD T, Th, Sat), R ankle fx 2000    Examination-Participation Restrictions Community Activity    Stability/Clinical Decision Making Stable/Uncomplicated    Rehab Potential Good    PT Frequency 2x / week    PT Duration 4 weeks    PT Treatment/Interventions ADLs/Self Care Home Management;Cryotherapy;Electrical Stimulation;Ultrasound;Therapeutic exercise;Balance training;Patient/family education;Manual  techniques;Dry needling;Therapeutic activities;Neuromuscular re-education;DME Instruction;Gait training;Stair  training;Functional mobility training;Orthotic Fit/Training    PT Next Visit Plan encourage outside ambulation for endurance and R hip/knee flexion    PT Home Exercise Plan heel/toe raises, sidestepping against green band, retrowalikng    Consulted and Agree with Plan of Care Patient           Patient will benefit from skilled therapeutic intervention in order to improve the following deficits and impairments:  Postural dysfunction,Decreased balance,Impaired flexibility,Abnormal gait,Decreased mobility,Difficulty walking,Decreased coordination  Visit Diagnosis: Muscle weakness (generalized)  Unsteadiness on feet     Problem List Patient Active Problem List   Diagnosis Date Noted  . Abnormality of gait 02/25/2020  . Protein-calorie malnutrition, severe 01/19/2020  . Epistaxis   . Sleep disturbance   . Slow transit constipation   . Hemorrhoids   . Chronic systolic congestive heart failure ([place])   . ESRD on dialysis ([place])   . Right hemiparesis ([place])   . Pressure injury of skin 12/20/2019  . Intraparenchymal hemorrhage of brain ([place]) 12/18/2019  . ICH (intracerebral hemorrhage) ([place]) 12/12/2019  . Viral disease 08/22/2019  . Chronic right-sided heart failure ([place]) 07/09/2019  . Supraventricular tachycardia ([place]) 07/09/2019  . Other cirrhosis of liver ([place]) 06/27/2019  . Heart failure ([place]) 02/21/2019  . Heart palpitations 08/14/2018  . Other fluid overload 07/27/2018  . Encounter for long-term (current) use of other medications 01/18/2018  . Hypothyroidism 01/18/2018  . Myalgia and myositis 01/18/2018  . Chronic nephritis 01/18/2018  . Other interstitial pulmonary diseases with fibrosis in diseases classified elsewhere ([place]) 01/18/2018  . Other long term (current) drug therapy 01/18/2018  . Sleep apnea 01/18/2018  . Unspecified persistent mental disorders due to conditions classified elsewhere 01/18/2018  . Venous reflux 01/18/2018  . Vitamin B12 deficiency 01/18/2018   . Vitamin D deficiency 01/18/2018  . Obstructive lung disease ([place]) 12/30/2017  . Other pruritus 12/27/2017  . Eruption cyst 12/19/2017  . 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 Chronic cough 11/11/2017  . Pulmonary hypertension ([place]) 11/11/2017  . Pulmonary arterial hypertension ([place]) 10/07/2017  . Acute ITP ([place]) 01/05/2017  . ESRD (end stage renal disease) ([place]) 12/28/2016  . Encounter for removal of sutures 09/09/2016  . Coagulation defect, unspecified ([place]) 09/01/2016  . Underimmunization status 08/05/2016  . Hemolytic anemia ([place]) 07/26/2016  . Epistaxis, recurrent 07/26/2016  . Unspecified protein-calorie malnutrition ([place]) 07/13/2016  . Aftercare including intermittent dialysis ([place]) 07/07/2016  . Anemia in chronic kidney disease 07/07/2016  . Hypokalemia 07/07/2016  . Iron deficiency anemia, unspecified 07/07/2016  . 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 Nonrheumatic mitral (valve) prolapse 07/07/2016  . Other irritable bowel syndrome 07/07/2016  . Other secondary thrombocytopenia 07/07/2016  . Secondary hyperparathyroidism of renal origin ([place]) 07/07/2016  . Thrombocytopenia ([place]) 07/01/2016  . ARF (acute renal failure) ([place]) 06/25/2016  . Anxiety 06/25/2016  . Bilateral carpal tunnel syndrome 10/22/2015  . Chronic gout without tophus 10/22/2015  . Chronic nonintractable headache 10/08/2015  . Fibroid uterus 01/03/2012  . H/O vitamin D deficiency 01/03/2012  . Post-menopausal 01/03/2012  . Hereditary and idiopathic peripheral neuropathy 11/19/2011  . Intestinal malabsorption 11/19/2011  . Lichen planus 04/54/0981  . Low back pain 11/19/2011  . Diffuse spasm of esophagus 11/11/2011  . ESR raised 11/11/2011  . Postinflammatory pulmonary fibrosis ([place]) 11/11/2011  . Scleroderma ([place]) 11/16/2010  . Rheumatoid arthritis ([place]) 11/16/2010  . Raynaud's disease 11/16/2010  . Symptomatic menopausal or female climacteric  states 11/16/2010  Lanice Shirts PT 03/29/2020, 2:29 PM  [facility] [address] [place], [place], [address] Phone: [phone]   Fax:  [phone]  Name: Katie Nunez MRN: [id number] Date of Birth: [birth year]

## 2020-04-01 NOTE — Progress Notes (Signed)
Yes 81 mg

## 2020-04-02 ENCOUNTER — Ambulatory Visit: Payer: Medicare Other

## 2020-04-02 ENCOUNTER — Ambulatory Visit: Payer: Medicare Other | Admitting: Occupational Therapy

## 2020-04-02 ENCOUNTER — Other Ambulatory Visit: Payer: Self-pay

## 2020-04-02 ENCOUNTER — Telehealth: Payer: Self-pay | Admitting: *Deleted

## 2020-04-02 DIAGNOSIS — M6281 Muscle weakness (generalized): Secondary | ICD-10-CM

## 2020-04-02 DIAGNOSIS — R29818 Other symptoms and signs involving the nervous system: Secondary | ICD-10-CM

## 2020-04-02 DIAGNOSIS — R2681 Unsteadiness on feet: Secondary | ICD-10-CM

## 2020-04-02 DIAGNOSIS — R278 Other lack of coordination: Secondary | ICD-10-CM

## 2020-04-02 NOTE — Therapy (Signed)
[facility] [address] [place], [place], [address] Phone: [phone]   Fax:  [phone]  Occupational Therapy Treatment  Patient Details  Name: Katie Nunez MRN: [id number] Date of Birth: 20-Aug-1955 Referring Provider (OT): Molli Barrows   Encounter Date: 04/02/2020   OT End of Session - 04/02/20 1506    Visit Number 14    Number of Visits 17    Date for OT Re-Evaluation 04/09/20    Authorization Type UHC Medicare    Authorization Time Period 90 day cert    Authorization - Visit Number 75    Authorization - Number of Visits 20    Progress Note Due on Visit 20    OT Start Time 1403    OT Stop Time 1445    OT Time Calculation (min) 42 min           Past Medical History:  Diagnosis Date  . Achalasia   . Anxiety   . Dysplasia of cervix, low grade (CIN 1)   . Environmental allergies    "all year long" (12/27/2016)  . ESRD (end stage renal disease) on dialysis [facility])    "TTS; [place]" (12/27/2016)  . Fibromyalgia   . GERD (gastroesophageal reflux disease)   . Gout   . Hypertension   . IBS (irritable bowel syndrome)   . MVP (mitral valve prolapse)   . RA (rheumatoid arthritis) (HCC)    FOLLOWED BY DR. SHANAHAN  . Raynaud's disease   . Scleroderma ([place])   . Seasonal allergies   . Thrombocytopenia ([place]) 07/01/2016   Acute fall to 13,000 07/01/16  . Tubular adenoma 01/08/2008   CECUM  . Vitamin D deficiency     Past Surgical History:  Procedure Laterality Date  . ANKLE FRACTURE SURGERY Right   . AV FISTULA PLACEMENT Left 06/28/2016   Procedure: left arm ARTERIOVENOUS (AV) FISTULA CREATION;  Surgeon: Rosetta Posner, MD;  Location: [facility];  Service: Vascular;  Laterality: Left;  . BASCILIC VEIN TRANSPOSITION Left 09/27/2016   Procedure: LEFT UPPER ARM CEPHALIC VEIN TRANSPOSITION;  Surgeon: Rosetta Posner, MD;  Location: [facility];  Service: Vascular;  Laterality: Left;  . BREAST BIOPSY     "? side"   . CESAREAN SECTION  1994  . CO2 LASER OF CERVIX    . COLONOSCOPY W/ BIOPSIES  01/08/2008  . INSERTION OF DIALYSIS CATHETER Right 06/28/2016   Procedure: INSERTION OF DIALYSIS CATHETER, right internal jugular;  Surgeon: Rosetta Posner, MD;  Location: [facility];  Service: Vascular;  Laterality: Right;  . MYOMECTOMY    . NASAL ENDOSCOPY WITH EPISTAXIS CONTROL N/A 12/29/2019   Procedure: NASAL ENDOSCOPY WITH EPISTAXIS CONTROL;  Surgeon: Leta Baptist, MD;  [facility];  Service: ENT;  Laterality: N/A;  . PELVIC LAPAROSCOPY  2011  . superficial thrombophlebitis Left 07-2014    There were no vitals filed for this visit.   Subjective Assessment - 04/02/20 1528    Subjective  Pt reports R scapula pain    Pertinent History Scleroderma, RA,HTN, ESRD (dialysis), hx R ankle Fx, 2000, anxiety,fibromyalgia, gout, HTN, MVP, Raynaud's  disease, C section, ostructive lung disease, B CTS, hereditary and periferal neuropathy, venous reflux, ICH    Limitations NO BP LUE due to fistula    Patient Stated Goals write and use RUE better, drive agin    Currently in Pain? Yes    Pain Score 4     Pain Location Scapula    Pain Orientation  Right    Pain Descriptors / Indicators Aching    Pain Type Acute pain    Pain Frequency Intermittent    Aggravating Factors  reaching    Pain Relieving Factors rest repositioning                Treatment: supine over foam roll down spine with pt performing scapular retraction Closed chain shoulder flexion in supine with cane, min-mod facilitation/ v.c Lower trunk rotation  Side to side for gentle stretch Supine holding 1 lbs weight shoulder circles x 10 reps each direction for scapular activation. Standing modified cat/ cow positions and rocking forwards and backwards at table top with therapist facilitating scapula Seated at table placing grooved pegs with RUE, min facilitation/ v.c to avoid shoulder hike, hotpack applied to right shoulder x 10 mins no adverse  reactions Manipulating coins in hand to place in back min v.c for posture Discussed importance of proper postioning at the computer at home to minimize pain, and recommendation that pt does not carry back pack as it may be contributing to pain.                 OT Short Term Goals - 03/19/20 1534      OT SHORT TERM GOAL #1   Title I with inital HEP.    Time 4    Period Weeks    Status On-going      OT SHORT TERM GOAL #2   Title Pt will demonstrate improved fine motor coordination for ADLs as evidenced by decreasing 9 hole peg test score for RUE to 52 secs or less    Baseline RUE 60.56, LUE 32.81    Time 4    Period Weeks    Status Achieved   RUE 43.84s     OT SHORT TERM GOAL #3   Title Pt will perform light home management/simple cooking tasks with supervision and min v.c    Time 4    Period Weeks    Status Achieved      OT SHORT TERM GOAL #4   Title Pt will demonstrate improved RUE functional use as eveidenced by increasing box/ blocks score to 25 blocks with RUE.    Time 4    Period Weeks    Status Achieved   RUE 30 blocks on 03/14/2020     OT SHORT TERM GOAL #5   Title Pt will write a short paragraph with at least 90% legibility.    Time 4    Period Weeks    Status On-going   75% accuracy legibility for 03/19/20            OT Long Term Goals - 04/02/20 1416      OT LONG TERM GOAL #1   Title I with updated HEP.-04/09/20    Time 8    Period Weeks    Status New      OT LONG TERM GOAL #2   Title Pt will perform basic home managment/ cooking modified Independently    Time 8    Period Weeks    Status New      OT LONG TERM GOAL #3   Title Pt will demonstrate improved fine motor coordination for ADLs as evidenced by decreasing 9 hole peg test score for RUE to 45 secs or less.    Time 8    Period Weeks    Status Achieved   43.84s RUE on 03/14/20     OT LONG TERM GOAL #4  Title Pt will write a short paragraph with 100% legibility.    Time 8    Period  Weeks    Status New      OT LONG TERM GOAL #5   Title Pt will demonstrate ability to retrieve a lightweight object at 125 shoulder flexion with RUE demonstrating good control.    Time 8    Period Weeks    Status New                 Plan - 04/02/20 1507    Clinical Impression Statement Pt arrives today with pain in R scapular region.    OT Occupational Profile and History Detailed Assessment- Review of Records and additional review of physical, cognitive, psychosocial history related to current functional performance    Occupational performance deficits (Please refer to evaluation for details): ADL's;IADL's;Leisure;Rest and Sleep;Social Participation;Other    Body Structure / Function / Physical Skills ADL;Endurance;Decreased knowledge of precautions;Decreased knowledge of use of DME;Flexibility;IADL;Pain;Skin integrity;Edema;Mobility;ROM    Rehab Potential Good    OT Frequency 2x / week    OT Duration 8 weeks    OT Treatment/Interventions Self-care/ADL training;Therapeutic exercise;DME and/or AE instruction;Therapist, nutritional;Compression bandaging;Other (comment);Manual Therapy;Scar mobilization;Coping strategies training;Energy conservation;Manual lymph drainage;Passive range of motion;Therapeutic activities    Plan continue to address, posture, R scapular pain    Consulted and Agree with Plan of Care Patient           Patient will benefit from skilled therapeutic intervention in order to improve the following deficits and impairments:   Body Structure / Function / Physical Skills: ADL,Endurance,Decreased knowledge of precautions,Decreased knowledge of use of DME,Flexibility,IADL,Pain,Skin integrity,Edema,Mobility,ROM       Visit Diagnosis: Muscle weakness (generalized)  Other lack of coordination  Unsteadiness on feet  Other symptoms and signs involving the nervous system    Problem List Patient Active Problem List   Diagnosis Date Noted  .  Abnormality of gait 02/25/2020  . Protein-calorie malnutrition, severe 01/19/2020  . Epistaxis   . Sleep disturbance   . Slow transit constipation   . Hemorrhoids   . Chronic systolic congestive heart failure ([place])   . ESRD on dialysis ([place])   . Right hemiparesis ([place])   . Pressure injury of skin 12/20/2019  . Intraparenchymal hemorrhage of brain ([place]) 12/18/2019  . ICH (intracerebral hemorrhage) ([place]) 12/12/2019  . Viral disease 08/22/2019  . Chronic right-sided heart failure ([place]) 07/09/2019  . Supraventricular tachycardia ([place]) 07/09/2019  . Other cirrhosis of liver ([place]) 06/27/2019  . Heart failure ([place]) 02/21/2019  . Heart palpitations 08/14/2018  . Other fluid overload 07/27/2018  . Encounter for long-term (current) use of other medications 01/18/2018  . Hypothyroidism 01/18/2018  . Myalgia and myositis 01/18/2018  . Chronic nephritis 01/18/2018  . Other interstitial pulmonary diseases with fibrosis in diseases classified elsewhere ([place]) 01/18/2018  . Other long term (current) drug therapy 01/18/2018  . Sleep apnea 01/18/2018  . Unspecified persistent mental disorders due to conditions classified elsewhere 01/18/2018  . Venous reflux 01/18/2018  . Vitamin B12 deficiency 01/18/2018  . Vitamin D deficiency 01/18/2018  . Obstructive lung disease ([place]) 12/30/2017  . Other pruritus 12/27/2017  . Eruption cyst 12/19/2017  . 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 ([place]) 11/28/2017  . Chronic cough 11/11/2017  . Pulmonary hypertension ([place]) 11/11/2017  . Pulmonary arterial hypertension ([place]) 10/07/2017  . Acute ITP ([place]) 01/05/2017  . ESRD (end stage renal disease) ([place]) 12/28/2016  . Encounter for removal of sutures 09/09/2016  .  Coagulation defect, unspecified ([place]) 09/01/2016  . Underimmunization status 08/05/2016  . Hemolytic anemia ([place]) 07/26/2016  . Epistaxis, recurrent 07/26/2016  . Unspecified protein-calorie malnutrition ([place])  07/13/2016  . Aftercare including intermittent dialysis ([place]) 07/07/2016  . Anemia in chronic kidney disease 07/07/2016  . Hypokalemia 07/07/2016  . Iron deficiency anemia, unspecified 07/07/2016  . 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 Nonrheumatic mitral (valve) prolapse 07/07/2016  . Other irritable bowel syndrome 07/07/2016  . Other secondary thrombocytopenia 07/07/2016  . Secondary hyperparathyroidism of renal origin ([place]) 07/07/2016  . Thrombocytopenia ([place]) 07/01/2016  . ARF (acute renal failure) ([place]) 06/25/2016  . Anxiety 06/25/2016  . Bilateral carpal tunnel syndrome 10/22/2015  . Chronic gout without tophus 10/22/2015  . Chronic nonintractable headache 10/08/2015  . Fibroid uterus 01/03/2012  . H/O vitamin D deficiency 01/03/2012  . Post-menopausal 01/03/2012  . Hereditary and idiopathic peripheral neuropathy 11/19/2011  . Intestinal malabsorption 11/19/2011  . Lichen planus 17/12/7869  . Low back pain 11/19/2011  . Diffuse spasm of esophagus 11/11/2011  . ESR raised 11/11/2011  . Postinflammatory pulmonary fibrosis ([place]) 11/11/2011  . Scleroderma ([place]) 11/16/2010  . Rheumatoid arthritis ([place]) 11/16/2010  . Raynaud's disease 11/16/2010  . Symptomatic menopausal or female climacteric states 11/16/2010    Janiya Millirons 04/02/2020, 3:32 PM  [facility] [address] [place], [place], [address] Phone: [phone]   Fax:  [phone]  Name: NUHA DEGNER MRN: [id number] Date of Birth: April 17, 1955

## 2020-04-02 NOTE — Telephone Encounter (Signed)
LVM informing patient her CT head showed resolving previous brain hemorrhage, good news. Left # for questions.

## 2020-04-02 NOTE — Patient Instructions (Signed)
Added bridging, abduction in hooklie and single leg bridging

## 2020-04-02 NOTE — Therapy (Signed)
[facility] [address] [place], [place], [address] Phone: [phone]   Fax:  [phone]  Physical Therapy Treatment  Patient Details  Name: Katie Nunez MRN: [id number] Date of Birth: 01/12/1955 Referring Provider (PT): [name] MD   Encounter Date: 04/02/2020   PT End of Session - 04/02/20 1549    Visit Number 14    Number of Visits 16    Date for PT Re-Evaluation 04/13/20    Authorization Type UHC-MC    PT Start Time 2094    PT Stop Time 1530    PT Time Calculation (min) 45 min    Equipment Utilized During Treatment Gait belt    Activity Tolerance Patient tolerated treatment well    Behavior During Therapy [facility] for tasks assessed/performed           Past Medical History:  Diagnosis Date  . Achalasia   . Anxiety   . Dysplasia of cervix, low grade (CIN 1)   . Environmental allergies    "all year long" (12/27/2016)  . ESRD (end stage renal disease) on dialysis [facility])    "TTS; [place]" (12/27/2016)  . Fibromyalgia   . GERD (gastroesophageal reflux disease)   . Gout   . Hypertension   . IBS (irritable bowel syndrome)   . MVP (mitral valve prolapse)   . RA (rheumatoid arthritis) (HCC)    FOLLOWED BY DR. SHANAHAN  . Raynaud's disease   . Scleroderma ([place])   . Seasonal allergies   . Thrombocytopenia ([place]) 07/01/2016   Acute fall to 13,000 07/01/16  . Tubular adenoma 01/08/2008   CECUM  . Vitamin D deficiency     Past Surgical History:  Procedure Laterality Date  . ANKLE FRACTURE SURGERY Right   . AV FISTULA PLACEMENT Left 06/28/2016   Procedure: left arm ARTERIOVENOUS (AV) FISTULA CREATION;  Surgeon: Rosetta Posner, MD;  Location: [facility] [place];  Service: Vascular;  Laterality: Left;  . BASCILIC VEIN TRANSPOSITION Left 09/27/2016   Procedure: LEFT UPPER ARM CEPHALIC VEIN TRANSPOSITION;  Surgeon: Rosetta Posner, MD;  Location: [facility];  Service: Vascular;  Laterality: Left;  . BREAST BIOPSY     "?  side"  . CESAREAN SECTION  1994  . CO2 LASER OF CERVIX    . COLONOSCOPY W/ BIOPSIES  01/08/2008  . INSERTION OF DIALYSIS CATHETER Right 06/28/2016   Procedure: INSERTION OF DIALYSIS CATHETER, right internal jugular;  Surgeon: Rosetta Posner, MD;  Location: [facility];  Service: Vascular;  Laterality: Right;  . MYOMECTOMY    . NASAL ENDOSCOPY WITH EPISTAXIS CONTROL N/A 12/29/2019   Procedure: NASAL ENDOSCOPY WITH EPISTAXIS CONTROL;  Surgeon: Leta Baptist, MD;  [facility];  Service: ENT;  Laterality: N/A;  . PELVIC LAPAROSCOPY  2011  . superficial thrombophlebitis Left 07-2014    There were no vitals filed for this visit.   Subjective Assessment - 04/02/20 1545    Subjective No falls to note, foot drop brace working well, wishes to DC cane if possible, R shoulder pain is main concern    Patient is accompained by: Family member    Pertinent History Scleroderma, RA, pulmonary HTN, kidney disease (dialysis) Tu,TH and Sat, Rt ankle fx/dislocation/surgery 2000    Limitations Walking    How long can you sit comfortably? unlimited    How long can you stand comfortably? unlimited    How long can you walk comfortably? unlimited    Patient Stated Goals strengthen ankle and knee R, get  off of cane    Currently in Pain? Yes    Pain Score 3     Pain Location Shoulder    Pain Orientation Right    Pain Type Chronic pain    Pain Onset More than a month ago                             [facility] Adult PT Treatment/Exercise - 04/02/20 0001      Ambulation/Gait   Ambulation/Gait Yes    Ambulation/Gait Assistance 4: Min guard    Ambulation/Gait Assistance Details performed stepping over orange stripe on floor w/o AD as well as stepping over 4" block requiring CGA for stability in absence of cane    Ambulation Distance (Feet) 350 Feet    Assistive device None    Gait Pattern Step-through pattern    Ambulation Surface Level;Unlevel      Knee/Hip Exercises: Seated   Long Arc Quad  Strengthening;Both;2 sets;15 reps    Long [employer] Limitations performed with ball squeeze      Knee/Hip Exercises: Supine   Bridges Right;2 sets;10 reps;Limitations    Bridges Limitations performed single leg bridge    Other Supine Knee/Hip Exercises supine clamshells against green band    Other Supine Knee/Hip Exercises marching in alt. manner against green band 2x10                  PT Education - 04/02/20 1548    Education Details Added bridging, abduction in hooklie and single leg bridging, discussed increasing protein levels and recommended arch supports    Person(s) Educated Patient    Methods Explanation;Demonstration;Tactile cues;Verbal cues;Handout    Comprehension Verbalized understanding;Returned demonstration;Need further instruction            PT Short Term Goals - 03/12/20 1612      PT SHORT TERM GOAL #1   Title Ind with initial HEP and able to demo back to PT w/o need of VCs    Baseline no HEP; 03/12/20 demos HEP w/o need of VCs    Time 2    Period Weeks    Status Achieved    Target Date 02/27/20      PT SHORT TERM GOAL #2   Title Patient to obtain [facility] and bring it to PT sessions for adjustment and training    Baseline not using cane prior; 03/12/20 Has been using cane in clinic, adjusted to her body habitus    Time 2    Period Weeks    Status Achieved    Target Date 02/27/20             PT Long Term Goals - 03/26/20 1816      PT LONG TERM GOAL #1   Title Patient to improve 5x STS score to < 20 sec    Baseline 24 seconds; NT    Time 8    Period Weeks    Status On-going    Target Date 04/23/20      PT LONG TERM GOAL #2   Title Patient to demo 4+/5 R ankle/knee strength throughout    Baseline 4/5 R ankle knee strength; 03/26/20 No changes    Time 8    Period Weeks    Status On-going    Target Date 04/23/20      PT LONG TERM GOAL #3   Title Patient to decerease TUG time to ,13.5 sec w/o AD    Baseline 18  sec TUG average of 2 trials; NT     Time 8    Period Weeks    Status On-going    Target Date 04/23/20      PT LONG TERM GOAL #4   Title Ambulate 1062f with LRAD across outdoor surfaces under distant S    Baseline limited to in home ambulation; Ambulation limitd to 206foutside with [facility]    Time 8    Period Weeks    Status On-going    Target Date 04/23/20      PT LONG TERM GOAL #5   Title Return to driving as appropriate    Baseline currently not driving; 03/19/54/31o change    Time 8    Period Weeks    Status On-going    Target Date 04/23/20                 Plan - 04/02/20 1550    Clinical Impression Statement Todays rehab session consisted of R hip strength and stability training in supine against green band for resistance, advanced to ambulation w/o cane incorporating balance challenges including stepping over orange stripe on floor track and stepping over obstacles performing better when leading with RLE.  Still not reaady to DC cane outside of home but may DC cane for in-home mobility.    Personal Factors and Comorbidities Age;Comorbidity 2    Comorbidities ESRD(HD T, Th, Sat), R ankle fx 2000    Examination-Participation Restrictions Community Activity    Stability/Clinical Decision Making Stable/Uncomplicated    Rehab Potential Good    PT Frequency 2x / week    PT Duration 4 weeks    PT Treatment/Interventions ADLs/Self Care Home Management;Cryotherapy;Electrical Stimulation;Ultrasound;Therapeutic exercise;Balance training;Patient/family education;Manual techniques;Dry needling;Therapeutic activities;Neuromuscular re-education;DME Instruction;Gait training;Stair training;Functional mobility training;Orthotic Fit/Training    PT Next Visit Plan assess progress towards goals and ability to ambulate w/o cane    PT Home Exercise Plan heel/toe raises, sidestepping against green band, retrowalikng    Consulted and Agree with Plan of Care Patient           Patient will benefit from skilled  therapeutic intervention in order to improve the following deficits and impairments:  Postural dysfunction,Decreased balance,Impaired flexibility,Abnormal gait,Decreased mobility,Difficulty walking,Decreased coordination  Visit Diagnosis: Unsteadiness on feet  Muscle weakness (generalized)     Problem List Patient Active Problem List   Diagnosis Date Noted  . Abnormality of gait 02/25/2020  . Protein-calorie malnutrition, severe 01/19/2020  . Epistaxis   . Sleep disturbance   . Slow transit constipation   . Hemorrhoids   . Chronic systolic congestive heart failure (HCLongbranch  . ESRD on dialysis (HCNome  . Right hemiparesis (HCLandover  . Pressure injury of skin 12/20/2019  . Intraparenchymal hemorrhage of brain (HCMingo12/07/2019  . ICH (intracerebral hemorrhage) (HCIago12/01/2019  . Viral disease 08/22/2019  . Chronic right-sided heart failure (HCBergenfield06/28/2021  . Supraventricular tachycardia (HCLinden06/28/2021  . Other cirrhosis of liver (HCHelena Valley Northeast06/16/2021  . Heart failure (HCSturgeon02/10/2019  . Heart palpitations 08/14/2018  . Other fluid overload 07/27/2018  . Encounter for long-term (current) use of other medications 01/18/2018  . Hypothyroidism 01/18/2018  . Myalgia and myositis 01/18/2018  . Chronic nephritis 01/18/2018  . Other interstitial pulmonary diseases with fibrosis in diseases classified elsewhere (HCLoveland01/08/2018  . Other long term (current) drug therapy 01/18/2018  . Sleep apnea 01/18/2018  . Unspecified persistent mental disorders due to conditions classified elsewhere 01/18/2018  . Venous reflux 01/18/2018  .  Vitamin B12 deficiency 01/18/2018  . Vitamin D deficiency 01/18/2018  . Obstructive lung disease ([place]) 12/30/2017  . Other pruritus 12/27/2017  . Eruption cyst 12/19/2017  . 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 Chronic cough 11/11/2017  . Pulmonary hypertension ([place]) 11/11/2017  . Pulmonary arterial  hypertension ([place]) 10/07/2017  . Acute ITP ([place]) 01/05/2017  . ESRD (end stage renal disease) ([place]) 12/28/2016  . Encounter for removal of sutures 09/09/2016  . Coagulation defect, unspecified ([place]) 09/01/2016  . Underimmunization status 08/05/2016  . Hemolytic anemia ([place]) 07/26/2016  . Epistaxis, recurrent 07/26/2016  . Unspecified protein-calorie malnutrition ([place]) 07/13/2016  . Aftercare including intermittent dialysis ([place]) 07/07/2016  . Anemia in chronic kidney disease 07/07/2016  . Hypokalemia 07/07/2016  . Iron deficiency anemia, unspecified 07/07/2016  . 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 Nonrheumatic mitral (valve) prolapse 07/07/2016  . Other irritable bowel syndrome 07/07/2016  . Other secondary thrombocytopenia 07/07/2016  . Secondary hyperparathyroidism of renal origin ([place]) 07/07/2016  . Thrombocytopenia ([place]) 07/01/2016  . ARF (acute renal failure) ([place]) 06/25/2016  . Anxiety 06/25/2016  . Bilateral carpal tunnel syndrome 10/22/2015  . Chronic gout without tophus 10/22/2015  . Chronic nonintractable headache 10/08/2015  . Fibroid uterus 01/03/2012  . H/O vitamin D deficiency 01/03/2012  . Post-menopausal 01/03/2012  . Hereditary and idiopathic peripheral neuropathy 11/19/2011  . Intestinal malabsorption 11/19/2011  . Lichen planus 02/01/2409  . Low back pain 11/19/2011  . Diffuse spasm of esophagus 11/11/2011  . ESR raised 11/11/2011  . Postinflammatory pulmonary fibrosis ([place]) 11/11/2011  . Scleroderma ([place]) 11/16/2010  . Rheumatoid arthritis ([place]) 11/16/2010  . Raynaud's disease 11/16/2010  . Symptomatic menopausal or female climacteric states 11/16/2010    Lanice Shirts 04/02/2020, 4:09 PM  [facility] [address] [place], [place], [address] Phone: [phone]   Fax:  [phone]  Name: Katie Nunez MRN: [id number] Date of Birth: 03/06/1955

## 2020-04-09 ENCOUNTER — Ambulatory Visit: Payer: Medicare Other | Admitting: Occupational Therapy

## 2020-04-09 ENCOUNTER — Other Ambulatory Visit: Payer: Self-pay

## 2020-04-09 ENCOUNTER — Ambulatory Visit: Payer: Medicare Other

## 2020-04-09 DIAGNOSIS — R2681 Unsteadiness on feet: Secondary | ICD-10-CM | POA: Diagnosis not present

## 2020-04-09 DIAGNOSIS — I69959 Hemiplegia and hemiparesis following unspecified cerebrovascular disease affecting unspecified side: Secondary | ICD-10-CM | POA: Insufficient documentation

## 2020-04-09 DIAGNOSIS — R2689 Other abnormalities of gait and mobility: Secondary | ICD-10-CM

## 2020-04-09 DIAGNOSIS — M6281 Muscle weakness (generalized): Secondary | ICD-10-CM

## 2020-04-09 DIAGNOSIS — R29818 Other symptoms and signs involving the nervous system: Secondary | ICD-10-CM

## 2020-04-09 DIAGNOSIS — R278 Other lack of coordination: Secondary | ICD-10-CM

## 2020-04-09 NOTE — Therapy (Signed)
[facility] [address] [place], [place], [address] Phone: [phone]   Fax:  [phone]  Physical Therapy Treatment  Patient Details  Name: Katie Nunez MRN: [id number] Date of Birth: November 25, 1955 Referring Provider (PT): [name] MD   Encounter Date: 04/09/2020   PT End of Session - 04/09/20 1824    Visit Number 15    Number of Visits 16    Date for PT Re-Evaluation 04/13/20    Authorization Type UHC-MC    PT Start Time 0145    PT Stop Time 1530    PT Time Calculation (min) 825 min    Equipment Utilized During Treatment Gait belt    Activity Tolerance Patient tolerated treatment well    Behavior During Therapy [facility] for tasks assessed/performed           Past Medical History:  Diagnosis Date  . Achalasia   . Anxiety   . Dysplasia of cervix, low grade (CIN 1)   . Environmental allergies    "all year long" (12/27/2016)  . ESRD (end stage renal disease) on dialysis [facility])    "TTS; [place]" (12/27/2016)  . Fibromyalgia   . GERD (gastroesophageal reflux disease)   . Gout   . Hypertension   . IBS (irritable bowel syndrome)   . MVP (mitral valve prolapse)   . RA (rheumatoid arthritis) (HCC)    FOLLOWED BY DR. SHANAHAN  . Raynaud's disease   . Scleroderma ([place])   . Seasonal allergies   . Thrombocytopenia ([place]) 07/01/2016   Acute fall to 13,000 07/01/16  . Tubular adenoma 01/08/2008   CECUM  . Vitamin D deficiency     Past Surgical History:  Procedure Laterality Date  . ANKLE FRACTURE SURGERY Right   . AV FISTULA PLACEMENT Left 06/28/2016   Procedure: left arm ARTERIOVENOUS (AV) FISTULA CREATION;  Surgeon: Rosetta Posner, MD;  Location: [place] [facility];  Service: Vascular;  Laterality: Left;  . BASCILIC VEIN TRANSPOSITION Left 09/27/2016   Procedure: LEFT UPPER ARM CEPHALIC VEIN TRANSPOSITION;  Surgeon: Rosetta Posner, MD;  Location: [facility];  Service: Vascular;  Laterality: Left;  . BREAST BIOPSY     "?  side"  . CESAREAN SECTION  1994  . CO2 LASER OF CERVIX    . COLONOSCOPY W/ BIOPSIES  01/08/2008  . INSERTION OF DIALYSIS CATHETER Right 06/28/2016   Procedure: INSERTION OF DIALYSIS CATHETER, right internal jugular;  Surgeon: Rosetta Posner, MD;  Location: [facility];  Service: Vascular;  Laterality: Right;  . MYOMECTOMY    . NASAL ENDOSCOPY WITH EPISTAXIS CONTROL N/A 12/29/2019   Procedure: NASAL ENDOSCOPY WITH EPISTAXIS CONTROL;  Surgeon: Leta Baptist, MD;  Location: [facility] OR;  Service: ENT;  Laterality: N/A;  . PELVIC LAPAROSCOPY  2011  . superficial thrombophlebitis Left 07-2014    There were no vitals filed for this visit.   Subjective Assessment - 04/09/20 1452    Subjective no falls to report, footdrop brace working well    Patient is accompained by: Family member    Pertinent History Scleroderma, RA, pulmonary HTN, kidney disease (dialysis) Tu,TH and Sat, Rt ankle fx/dislocation/surgery 2000    Limitations Walking    How long can you sit comfortably? unlimited    How long can you stand comfortably? unlimited    How long can you walk comfortably? unlimited    Patient Stated Goals strengthen ankle and knee R, get off of cane    Pain Onset More than a month ago  [facility] Adult PT Treatment/Exercise - 04/09/20 0001      Ambulation/Gait   Ambulation/Gait Yes    Ambulation/Gait Assistance 5: Supervision    Ambulation/Gait Assistance Details cued to bring L knee forward vs foot    Ambulation Distance (Feet) 230 Feet    Assistive device Straight cane    Gait Pattern Step-through pattern    Ambulation Surface Level;Indoor      Knee/Hip Exercises: Aerobic   Nustep L2 8' arms 10      Knee/Hip Exercises: Supine   Bridges Both;3 sets;10 reps    Bridges Limitations standard bridge for 10 reps then siingle leg bridge 10x per side    Other Supine Knee/Hip Exercises sidelie clamsheels, 2x10 ea. side               Balance Exercises - 04/09/20  0001      Balance Exercises: Standing   Step Ups Forward;Lateral;UE support 1;Limitations    Step Ups Limitations stepping onto cushioned 4" block at counter 15x with LLE only               PT Short Term Goals - 03/12/20 1612      PT SHORT TERM GOAL #1   Title Ind with initial HEP and able to demo back to PT w/o need of VCs    Baseline no HEP; 03/12/20 demos HEP w/o need of VCs    Time 2    Period Weeks    Status Achieved    Target Date 02/27/20      PT SHORT TERM GOAL #2   Title Patient to obtain [facility] and bring it to PT sessions for adjustment and training    Baseline not using cane prior; 03/12/20 Has been using cane in clinic, adjusted to her body habitus    Time 2    Period Weeks    Status Achieved    Target Date 02/27/20             PT Long Term Goals - 03/26/20 1816      PT LONG TERM GOAL #1   Title Patient to improve 5x STS score to < 20 sec    Baseline 24 seconds; NT    Time 8    Period Weeks    Status On-going    Target Date 04/23/20      PT LONG TERM GOAL #2   Title Patient to demo 4+/5 R ankle/knee strength throughout    Baseline 4/5 R ankle knee strength; 03/26/20 No changes    Time 8    Period Weeks    Status On-going    Target Date 04/23/20      PT LONG TERM GOAL #3   Title Patient to decerease TUG time to ,13.5 sec w/o AD    Baseline 18 sec TUG average of 2 trials; NT    Time 8    Period Weeks    Status On-going    Target Date 04/23/20      PT LONG TERM GOAL #4   Title Ambulate 1074f with LRAD across outdoor surfaces under distant S    Baseline limited to in home ambulation; Ambulation limitd to 2047foutside with [facility]    Time 8    Period Weeks    Status On-going    Target Date 04/23/20      PT LONG TERM GOAL #5   Title Return to driving as appropriate    Baseline currently not driving; 03/13/10/24o change  Time 8    Period Weeks    Status On-going    Target Date 04/23/20                 Plan - 04/09/20 1825     Clinical Impression Statement Focus of todays skilled session was continued L hip strengthening and stepping task review, continued weakness in L gluteal group but improved hip stability noted with ambulation tasks, trial of ambulation w/o brace reveals footdrop and fall risk, improved when wearing brace, focused on advancing knee forward with gait which yieled improved hip flexion as well as step length    Personal Factors and Comorbidities Age;Comorbidity 2    Comorbidities ESRD(HD T, Th, Sat), R ankle fx 2000    Examination-Participation Restrictions Community Activity    Stability/Clinical Decision Making Stable/Uncomplicated    Rehab Potential Good    PT Frequency 2x / week    PT Duration 4 weeks    PT Treatment/Interventions ADLs/Self Care Home Management;Cryotherapy;Electrical Stimulation;Ultrasound;Therapeutic exercise;Balance training;Patient/family education;Manual techniques;Dry needling;Therapeutic activities;Neuromuscular re-education;DME Instruction;Gait training;Stair training;Functional mobility training;Orthotic Fit/Training    PT Next Visit Plan Assess goals and need for additonal PT services    PT Home Exercise Plan heel/toe raises, sidestepping against green band, retrowalikng    Consulted and Agree with Plan of Care Patient           Patient will benefit from skilled therapeutic intervention in order to improve the following deficits and impairments:  Postural dysfunction,Decreased balance,Impaired flexibility,Abnormal gait,Decreased mobility,Difficulty walking,Decreased coordination  Visit Diagnosis: Unsteadiness on feet  Muscle weakness (generalized)     Problem List Patient Active Problem List   Diagnosis Date Noted  . Abnormality of gait 02/25/2020  . Protein-calorie malnutrition, severe 01/19/2020  . Epistaxis   . Sleep disturbance   . Slow transit constipation   . Hemorrhoids   . Chronic systolic congestive heart failure ([place])   . ESRD on dialysis ([place])    . Right hemiparesis ([place])   . Pressure injury of skin 12/20/2019  . Intraparenchymal hemorrhage of brain ([place]) 12/18/2019  . ICH (intracerebral hemorrhage) ([place]) 12/12/2019  . Viral disease 08/22/2019  . Chronic right-sided heart failure ([place]) 07/09/2019  . Supraventricular tachycardia ([place]) 07/09/2019  . Other cirrhosis of liver ([place]) 06/27/2019  . Heart failure ([place]) 02/21/2019  . Heart palpitations 08/14/2018  . Other fluid overload 07/27/2018  . Encounter for long-term (current) use of other medications 01/18/2018  . Hypothyroidism 01/18/2018  . Myalgia and myositis 01/18/2018  . Chronic nephritis 01/18/2018  . Other interstitial pulmonary diseases with fibrosis in diseases classified elsewhere ([place]) 01/18/2018  . Other long term (current) drug therapy 01/18/2018  . Sleep apnea 01/18/2018  . Unspecified persistent mental disorders due to conditions classified elsewhere 01/18/2018  . Venous reflux 01/18/2018  . Vitamin B12 deficiency 01/18/2018  . Vitamin D deficiency 01/18/2018  . Obstructive lung disease ([place]) 12/30/2017  . Other pruritus 12/27/2017  . Eruption cyst 12/19/2017  . 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 ([place]) 11/28/2017  . Chronic cough 11/11/2017  . Pulmonary hypertension ([place]) 11/11/2017  . Pulmonary arterial hypertension ([place]) 10/07/2017  . Acute ITP ([place]) 01/05/2017  . ESRD (end stage renal disease) ([place]) 12/28/2016  . Encounter for removal of sutures 09/09/2016  . Coagulation defect, unspecified ([place]) 09/01/2016  . Underimmunization status 08/05/2016  . Hemolytic anemia ([place]) 07/26/2016  . Epistaxis, recurrent 07/26/2016  . Unspecified protein-calorie malnutrition ([place]) 07/13/2016  . Aftercare including intermittent dialysis ([place]) 07/07/2016  . Anemia in chronic kidney  disease 07/07/2016  . Hypokalemia 07/07/2016  . Iron deficiency anemia, unspecified 07/07/2016  . 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 Nonrheumatic  mitral (valve) prolapse 07/07/2016  . Other irritable bowel syndrome 07/07/2016  . Other secondary thrombocytopenia 07/07/2016  . Secondary hyperparathyroidism of renal origin ([place]) 07/07/2016  . Thrombocytopenia ([place]) 07/01/2016  . ARF (acute renal failure) ([place]) 06/25/2016  . Anxiety 06/25/2016  . Bilateral carpal tunnel syndrome 10/22/2015  . Chronic gout without tophus 10/22/2015  . Chronic nonintractable headache 10/08/2015  . Fibroid uterus 01/03/2012  . H/O vitamin D deficiency 01/03/2012  . Post-menopausal 01/03/2012  . Hereditary and idiopathic peripheral neuropathy 11/19/2011  . Intestinal malabsorption 11/19/2011  . Lichen planus 67/89/3810  . Low back pain 11/19/2011  . Diffuse spasm of esophagus 11/11/2011  . ESR raised 11/11/2011  . Postinflammatory pulmonary fibrosis ([place]) 11/11/2011  . Scleroderma ([place]) 11/16/2010  . Rheumatoid arthritis ([place]) 11/16/2010  . Raynaud's disease 11/16/2010  . Symptomatic menopausal or female climacteric states 11/16/2010    Lanice Shirts PT 04/09/2020, 6:34 PM  [facility] [address] [name], [place], [address] Phone: [phone]   Fax:  [phone]  Name: JASMARIE COPPOCK MRN: [id number] Date of Birth: 10/02/1955

## 2020-04-10 NOTE — Therapy (Addendum)
[facility] [address] [place], [place], [address] Phone: [phone]   Fax:  [phone]  Occupational Therapy Treatment  Patient Details  Name: Katie Nunez MRN: [id number] Date of Birth: 1955/06/14 Referring Provider (OT): Molli Barrows   Encounter Date: 04/09/2020   OT End of Session - 04/09/20 1421    Visit Number 15 out of 25   Date for OT Re-Evaluation     Authorization Type UHC Medicare    Authorization Time Period 34 day cert    Authorization - Visit Number 15    Authorization - Number of Visits 20    OT Start Time 1416    OT Stop Time 1445    OT Time Calculation (min) 29 min    Activity Tolerance Patient tolerated treatment well    Behavior During Therapy [facility] for tasks assessed/performed           Past Medical History:  Diagnosis Date  . Achalasia   . Anxiety   . Dysplasia of cervix, low grade (CIN 1)   . Environmental allergies    "all year long" (12/27/2016)  . ESRD (end stage renal disease) on dialysis [facility])    "TTS; [place]" (12/27/2016)  . Fibromyalgia   . GERD (gastroesophageal reflux disease)   . Gout   . Hypertension   . IBS (irritable bowel syndrome)   . MVP (mitral valve prolapse)   . RA (rheumatoid arthritis) (HCC)    FOLLOWED BY DR. SHANAHAN  . Raynaud's disease   . Scleroderma ([place])   . Seasonal allergies   . Thrombocytopenia ([place]) 07/01/2016   Acute fall to 13,000 07/01/16  . Tubular adenoma 01/08/2008   CECUM  . Vitamin D deficiency     Past Surgical History:  Procedure Laterality Date  . ANKLE FRACTURE SURGERY Right   . AV FISTULA PLACEMENT Left 06/28/2016   Procedure: left arm ARTERIOVENOUS (AV) FISTULA CREATION;  Surgeon: Rosetta Posner, MD;  Location: [place];  Service: Vascular;  Laterality: Left;  . BASCILIC VEIN TRANSPOSITION Left 09/27/2016   Procedure: LEFT UPPER ARM CEPHALIC VEIN TRANSPOSITION;  Surgeon: Rosetta Posner, MD;  Location: [place];  Service:  Vascular;  Laterality: Left;  . BREAST BIOPSY     "? side"  . CESAREAN SECTION  1994  . CO2 LASER OF CERVIX    . COLONOSCOPY W/ BIOPSIES  01/08/2008  . INSERTION OF DIALYSIS CATHETER Right 06/28/2016   Procedure: INSERTION OF DIALYSIS CATHETER, right internal jugular;  Surgeon: Rosetta Posner, MD;  Location: [place];  Service: Vascular;  Laterality: Right;  . MYOMECTOMY    . NASAL ENDOSCOPY WITH EPISTAXIS CONTROL N/A 12/29/2019   Procedure: NASAL ENDOSCOPY WITH EPISTAXIS CONTROL;  Surgeon: Leta Baptist, MD;  Location: [facility] OR;  Service: ENT;  Laterality: N/A;  . PELVIC LAPAROSCOPY  2011  . superficial thrombophlebitis Left 07-2014    There were no vitals filed for this visit.   Subjective Assessment - 04/09/20 1420    Subjective  Pt reports R scapula pain    Pertinent History Scleroderma, RA,HTN, ESRD (dialysis), hx R ankle Fx, 2000, anxiety,fibromyalgia, gout, HTN, MVP, Raynaud's  disease, C section, ostructive lung disease, B CTS, hereditary and periferal neuropathy, venous reflux, ICH    Limitations NO BP LUE due to fistula    Patient Stated Goals write and use RUE better, drive agin    Currently in Pain? Yes    Pain Score 5     Pain Location Shoulder  Pain Orientation Right    Pain Descriptors / Indicators Aching    Pain Type Chronic pain    Pain Onset More than a month ago    Pain Frequency Intermittent    Aggravating Factors  reaching    Pain Relieving Factors rest              Treatment: Pt arrived late for session she had been at MD office. Therapist checked progress towards goals. See goals for updates. Pt wrote a short paragraph with 100% legibility, with increased time. Reviewed flipping and dealing cards from coordination HEP, min v.c Discussed potential updates to goals.                     OT Short Term Goals - 04/09/20 1442      OT SHORT TERM GOAL #1   Title I with inital HEP.    Time 4    Period Weeks    Status Achieved      OT SHORT TERM  GOAL #2   Title Pt will demonstrate improved fine motor coordination for ADLs as evidenced by decreasing 9 hole peg test score for RUE to 52 secs or less    Baseline RUE 60.56, LUE 32.81    Time 4    Period Weeks    Status Achieved   RUE 43.84s     OT SHORT TERM GOAL #3   Title Pt will perform light home management/simple cooking tasks with supervision and min v.c    Time 4    Period Weeks    Status Achieved      OT SHORT TERM GOAL #4   Title Pt will demonstrate improved RUE functional use as eveidenced by increasing box/ blocks score to 25 blocks with RUE.    Time 4    Period Weeks    Status Achieved   RUE 30 blocks on 03/14/2020     OT SHORT TERM GOAL #5   Title Pt will write a short paragraph with at least 90% legibility.    Time 4    Period Weeks    Status Achieved   100% legibility with increased time            OT Long Term Goals - 04/09/20 1432      OT LONG TERM GOAL #1   Title I with updated HEP.-04/09/20    Status On-going   needs updates to HEP     OT LONG TERM GOAL #2   Title Pt will perform basic home managment/ cooking modified Independently    Status Partially Met   Pt has performed but she is not performing consistently, pt reports continued challenges.     OT LONG TERM GOAL #3   Title Pt will demonstrate improved fine motor coordination for ADLs as evidenced by decreasing 9 hole peg test score for RUE to 45 secs or less.    Status Achieved      OT LONG TERM GOAL #4   Title Pt will write a short paragraph with 100% legibility.    Status Achieved   with increased time     OT LONG TERM GOAL #5   Title Pt will demonstrate ability to retrieve a lightweight object at 125 shoulder flexion with RUE demonstrating good control. Revised  goal-Pt will retrieve a light weight object at 115 shoulder flexion with pain less than or equal to 2/10.    Time 5    Period Weeks    Status Revised  105 prior to pain, pt can reach highter but she has significant pain      Long Term Additional Goals   Additional Long Term Goals Yes      OT LONG TERM GOAL #6   Title Pt will report cooking a simple meal 2x week modified Indpendently.    Time 5    Period Weeks    Status New      OT LONG TERM GOAL #7   Title Pt will perform laundry and fold clothes modified independently.    Time 5    Period Weeks    Status New      OT LONG TERM GOAL #8   Title Pt will report increased ease with removing items form the oven, stirring food and  scrambling an egg with RUE    Time 5    Period Weeks    Status New      OT LONG TERM GOAL  #9   TITLE Pt will demonstrate improved fine motor coordination for ADLs as evidenced by decreasing 9 hole peg test score to 40 secs or less for RUE    Time 5    Period Weeks    Status New                 Plan - 04/10/20 0919    Clinical Impression Statement Pt is progressing towards goals, however she remains limited by right shoulder and scapular pain. Pt can benefit from continued skilled occupational therapy to address pain and RUE functional use in order to maximize pt's safety and I with ADLS/IADLs.    OT Occupational Profile and History Detailed Assessment- Review of Records and additional review of physical, cognitive, psychosocial history related to current functional performance    Occupational performance deficits (Please refer to evaluation for details): ADL's;IADL's;Leisure;Rest and Sleep;Social Participation;Other    Body Structure / Function / Physical Skills ADL;Endurance;Decreased knowledge of precautions;Decreased knowledge of use of DME;Flexibility;IADL;Pain;Skin integrity;Edema;Mobility;ROM    Rehab Potential Good    OT Frequency 2x / week    OT Duration 5 weeks    OT Treatment/Interventions Self-care/ADL training;Therapeutic exercise;DME and/or AE instruction;Therapist, nutritional;Compression bandaging;Other (comment);Manual Therapy;Scar mobilization;Coping strategies training;Energy conservation;Manual  lymph drainage;Passive range of motion;Therapeutic activities    Plan continue to address posture, right scapular pain, progress to home management/ cooking tasks    Consulted and Agree with Plan of Care Patient           Patient will benefit from skilled therapeutic intervention in order to improve the following deficits and impairments:   Body Structure / Function / Physical Skills: ADL,Endurance,Decreased knowledge of precautions,Decreased knowledge of use of DME,Flexibility,IADL,Pain,Skin integrity,Edema,Mobility,ROM       Visit Diagnosis: Muscle weakness (generalized)  Other lack of coordination  Unsteadiness on feet  Other symptoms and signs involving the nervous system  Other abnormalities of gait and mobility    Problem List Patient Active Problem List   Diagnosis Date Noted  . Abnormality of gait 02/25/2020  . Protein-calorie malnutrition, severe 01/19/2020  . Epistaxis   . Sleep disturbance   . Slow transit constipation   . Hemorrhoids   . Chronic systolic congestive heart failure ([place])   . ESRD on dialysis ([place])   . Right hemiparesis ([place])   . Pressure injury of skin 12/20/2019  . Intraparenchymal hemorrhage of brain ([place]) 12/18/2019  . ICH (intracerebral hemorrhage) ([place]) 12/12/2019  . Viral disease 08/22/2019  . Chronic right-sided heart failure ([place]) 07/09/2019  . Supraventricular tachycardia ([place]) 07/09/2019  .  Other cirrhosis of liver ([place]) 06/27/2019  . Heart failure ([place]) 02/21/2019  . Heart palpitations 08/14/2018  . Other fluid overload 07/27/2018  . Encounter for long-term (current) use of other medications 01/18/2018  . Hypothyroidism 01/18/2018  . Myalgia and myositis 01/18/2018  . Chronic nephritis 01/18/2018  . Other interstitial pulmonary diseases with fibrosis in diseases classified elsewhere ([place]) 01/18/2018  . Other long term (current) drug therapy 01/18/2018  . Sleep apnea 01/18/2018  . Unspecified persistent mental disorders due  to conditions classified elsewhere 01/18/2018  . Venous reflux 01/18/2018  . Vitamin B12 deficiency 01/18/2018  . Vitamin D deficiency 01/18/2018  . Obstructive lung disease ([place]) 12/30/2017  . Other pruritus 12/27/2017  . Eruption cyst 12/19/2017  . 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 Chronic cough 11/11/2017  . Pulmonary hypertension ([place]) 11/11/2017  . Pulmonary arterial hypertension ([place]) 10/07/2017  . Acute ITP ([place]) 01/05/2017  . ESRD (end stage renal disease) ([place]) 12/28/2016  . Encounter for removal of sutures 09/09/2016  . Coagulation defect, unspecified ([place]) 09/01/2016  . Underimmunization status 08/05/2016  . Hemolytic anemia ([place]) 07/26/2016  . Epistaxis, recurrent 07/26/2016  . Unspecified protein-calorie malnutrition ([place]) 07/13/2016  . Aftercare including intermittent dialysis ([place]) 07/07/2016  . Anemia in chronic kidney disease 07/07/2016  . Hypokalemia 07/07/2016  . Iron deficiency anemia, unspecified 07/07/2016  . 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 Nonrheumatic mitral (valve) prolapse 07/07/2016  . Other irritable bowel syndrome 07/07/2016  . Other secondary thrombocytopenia 07/07/2016  . Secondary hyperparathyroidism of renal origin ([place]) 07/07/2016  . Thrombocytopenia ([place]) 07/01/2016  . ARF (acute renal failure) ([place]) 06/25/2016  . Anxiety 06/25/2016  . Bilateral carpal tunnel syndrome 10/22/2015  . Chronic gout without tophus 10/22/2015  . Chronic nonintractable headache 10/08/2015  . Fibroid uterus 01/03/2012  . H/O vitamin D deficiency 01/03/2012  . Post-menopausal 01/03/2012  . Hereditary and idiopathic peripheral neuropathy 11/19/2011  . Intestinal malabsorption 11/19/2011  . Lichen planus 44/03/4740  . Low back pain 11/19/2011  . Diffuse spasm of esophagus 11/11/2011  . ESR raised 11/11/2011  . Postinflammatory pulmonary fibrosis ([place]) 11/11/2011  . Scleroderma ([place]) 11/16/2010  . Rheumatoid  arthritis ([place]) 11/16/2010  . Raynaud's disease 11/16/2010  . Symptomatic menopausal or female climacteric states 11/16/2010    Katie Nunez 04/10/2020, 4:11 PM  [facility] [address] [place], [place], [address] Phone: [phone]   Fax:  [phone]  Name: Katie Nunez MRN: [id number] Date of Birth: 05-27-1955

## 2020-04-11 ENCOUNTER — Other Ambulatory Visit: Payer: Self-pay

## 2020-04-11 ENCOUNTER — Ambulatory Visit: Payer: Medicare Other | Attending: Family Medicine | Admitting: Occupational Therapy

## 2020-04-11 ENCOUNTER — Encounter: Payer: Self-pay | Admitting: Occupational Therapy

## 2020-04-11 DIAGNOSIS — R2681 Unsteadiness on feet: Secondary | ICD-10-CM | POA: Insufficient documentation

## 2020-04-11 DIAGNOSIS — R2689 Other abnormalities of gait and mobility: Secondary | ICD-10-CM | POA: Diagnosis present

## 2020-04-11 DIAGNOSIS — M6281 Muscle weakness (generalized): Secondary | ICD-10-CM | POA: Diagnosis present

## 2020-04-11 DIAGNOSIS — R278 Other lack of coordination: Secondary | ICD-10-CM | POA: Insufficient documentation

## 2020-04-11 DIAGNOSIS — R29818 Other symptoms and signs involving the nervous system: Secondary | ICD-10-CM | POA: Diagnosis present

## 2020-04-11 NOTE — Therapy (Signed)
[facility] [address] [place], [place], [address] Phone: [phone]   Fax:  [phone]  Occupational Therapy Treatment  Patient Details  Name: Katie Nunez MRN: [id number] Date of Birth: 03-16-1955 Referring Provider (OT): Molli Barrows   Encounter Date: 04/11/2020   OT End of Session - 04/11/20 1541    Visit Number 16    Number of Visits 25    Date for OT Re-Evaluation 05/16/20    Authorization Type UHC Medicare    Authorization Time Period 90 day cert week 1/5    Authorization - Visit Number 16    Authorization - Number of Visits 20    OT Start Time 1540   pt arrived late   OT Stop Time 1618    OT Time Calculation (min) 38 min    Activity Tolerance Patient tolerated treatment well    Behavior During Therapy [facility] for tasks assessed/performed           Past Medical History:  Diagnosis Date  . Achalasia   . Anxiety   . Dysplasia of cervix, low grade (CIN 1)   . Environmental allergies    "all year long" (12/27/2016)  . ESRD (end stage renal disease) on dialysis [facility])    "TTS; [place]" (12/27/2016)  . Fibromyalgia   . GERD (gastroesophageal reflux disease)   . Gout   . Hypertension   . IBS (irritable bowel syndrome)   . MVP (mitral valve prolapse)   . RA (rheumatoid arthritis) (HCC)    FOLLOWED BY DR. SHANAHAN  . Raynaud's disease   . Scleroderma ([place])   . Seasonal allergies   . Thrombocytopenia ([place]) 07/01/2016   Acute fall to 13,000 07/01/16  . Tubular adenoma 01/08/2008   CECUM  . Vitamin D deficiency     Past Surgical History:  Procedure Laterality Date  . ANKLE FRACTURE SURGERY Right   . AV FISTULA PLACEMENT Left 06/28/2016   Procedure: left arm ARTERIOVENOUS (AV) FISTULA CREATION;  Surgeon: Rosetta Posner, MD;  Location: [facility];  Service: Vascular;  Laterality: Left;  . BASCILIC VEIN TRANSPOSITION Left 09/27/2016   Procedure: LEFT UPPER ARM CEPHALIC VEIN TRANSPOSITION;  Surgeon:  Rosetta Posner, MD;  Location: [facility];  Service: Vascular;  Laterality: Left;  . BREAST BIOPSY     "? side"  . CESAREAN SECTION  1994  . CO2 LASER OF CERVIX    . COLONOSCOPY W/ BIOPSIES  01/08/2008  . INSERTION OF DIALYSIS CATHETER Right 06/28/2016   Procedure: INSERTION OF DIALYSIS CATHETER, right internal jugular;  Surgeon: Rosetta Posner, MD;  Location: [facility];  Service: Vascular;  Laterality: Right;  . MYOMECTOMY    . NASAL ENDOSCOPY WITH EPISTAXIS CONTROL N/A 12/29/2019   Procedure: NASAL ENDOSCOPY WITH EPISTAXIS CONTROL;  Surgeon: Leta Baptist, MD;  Location: [facility] OR;  Service: ENT;  Laterality: N/A;  . PELVIC LAPAROSCOPY  2011  . superficial thrombophlebitis Left 07-2014    There were no vitals filed for this visit.   Subjective Assessment - 04/11/20 1543    Subjective  Pt reports pain in shoulder blade/seratus anterior    Pertinent History Scleroderma, RA,HTN, ESRD (dialysis), hx R ankle Fx, 2000, anxiety,fibromyalgia, gout, HTN, MVP, Raynaud's  disease, C section, ostructive lung disease, B CTS, hereditary and periferal neuropathy, venous reflux, ICH    Limitations NO BP LUE due to fistula    Patient Stated Goals write and use RUE better, drive agin    Currently in Pain? Yes  Pain Score 5     Pain Location Shoulder    Pain Orientation Right    Pain Descriptors / Indicators Aching    Pain Type Chronic pain    Pain Onset More than a month ago    Pain Frequency Intermittent             Reviewed goal updates from recertification. Pt in agreement.  Small Pegs: with RUE for increase fine motor coordination and copying pattern. Pt with min difficulty with coordination but reported significant fatigue in LUE shoulder approx 1/4 way through. Copied pattern with no difficulty.                     OT Short Term Goals - 04/09/20 1442      OT SHORT TERM GOAL #1   Title I with inital HEP.    Time 4    Period Weeks    Status Achieved      OT SHORT TERM GOAL #2    Title Pt will demonstrate improved fine motor coordination for ADLs as evidenced by decreasing 9 hole peg test score for RUE to 52 secs or less    Baseline RUE 60.56, LUE 32.81    Time 4    Period Weeks    Status Achieved   RUE 43.84s     OT SHORT TERM GOAL #3   Title Pt will perform light home management/simple cooking tasks with supervision and min v.c    Time 4    Period Weeks    Status Achieved      OT SHORT TERM GOAL #4   Title Pt will demonstrate improved RUE functional use as eveidenced by increasing box/ blocks score to 25 blocks with RUE.    Time 4    Period Weeks    Status Achieved   RUE 30 blocks on 03/14/2020     OT SHORT TERM GOAL #5   Title Pt will write a short paragraph with at least 90% legibility.    Time 4    Period Weeks    Status Achieved   100% legibility with increased time            OT Long Term Goals - 04/09/20 1432      OT LONG TERM GOAL #1   Title I with updated HEP.-04/09/20    Status On-going   needs updates to HEP     OT LONG TERM GOAL #2   Title Pt will perform basic home managment/ cooking modified Independently    Status Partially Met   Pt has performed but she is not performing consistently, pt reports continued challenges.     OT LONG TERM GOAL #3   Title Pt will demonstrate improved fine motor coordination for ADLs as evidenced by decreasing 9 hole peg test score for RUE to 45 secs or less.    Status Achieved      OT LONG TERM GOAL #4   Title Pt will write a short paragraph with 100% legibility.    Status Achieved   with increased time     OT LONG TERM GOAL #5   Title Pt will demonstrate ability to retrieve a lightweight object at 125 shoulder flexion with RUE demonstrating good control. Revised  goal-Pt will retrieve a light weight object at 115 shoulder flexion with pain less than or equal to 2/10.    Time 5    Period Weeks    Status Revised   105 prior to pain,  pt can reach highter but she has significant pain     Long Term  Additional Goals   Additional Long Term Goals Yes      OT LONG TERM GOAL #6   Title Pt will report cooking a simple meal 2x week modified Indpendently.    Time 5    Period Weeks    Status New      OT LONG TERM GOAL #7   Title Pt will perform laundry and fold clothes modified independently.    Time 5    Period Weeks    Status New      OT LONG TERM GOAL #8   Title Pt will report increased ease with removing items form the oven, stirring food and  scrambling an egg with RUE    Time 5    Period Weeks    Status New      OT LONG TERM GOAL  #9   TITLE Pt will demonstrate improved fine motor coordination for ADLs as evidenced by decreasing 9 hole peg test score to 40 secs or less for RUE    Time 5    Period Weeks    Status New                 Plan - 04/11/20 1552    Clinical Impression Statement Pt is progressing towards goals. OT reviewed new and revised goals.    OT Occupational Profile and History Detailed Assessment- Review of Records and additional review of physical, cognitive, psychosocial history related to current functional performance    Occupational performance deficits (Please refer to evaluation for details): ADL's;IADL's;Leisure;Rest and Sleep;Social Participation;Other    Body Structure / Function / Physical Skills ADL;Endurance;Decreased knowledge of precautions;Decreased knowledge of use of DME;Flexibility;IADL;Pain;Skin integrity;Edema;Mobility;ROM    Rehab Potential Good    OT Frequency 2x / week    OT Duration --   5 weeks   OT Treatment/Interventions Self-care/ADL training;Therapeutic exercise;DME and/or AE instruction;Therapist, nutritional;Compression bandaging;Other (comment);Manual Therapy;Scar mobilization;Coping strategies training;Energy conservation;Manual lymph drainage;Passive range of motion;Therapeutic activities    Plan continue to address posture, right scapular pain, progress to home management/ cooking tasks    Consulted and Agree with  Plan of Care Patient           Patient will benefit from skilled therapeutic intervention in order to improve the following deficits and impairments:   Body Structure / Function / Physical Skills: ADL,Endurance,Decreased knowledge of precautions,Decreased knowledge of use of DME,Flexibility,IADL,Pain,Skin integrity,Edema,Mobility,ROM       Visit Diagnosis: Other lack of coordination  Muscle weakness (generalized)  Unsteadiness on feet  Other symptoms and signs involving the nervous system  Other abnormalities of gait and mobility    Problem List Patient Active Problem List   Diagnosis Date Noted  . Abnormality of gait 02/25/2020  . Protein-calorie malnutrition, severe 01/19/2020  . Epistaxis   . Sleep disturbance   . Slow transit constipation   . Hemorrhoids   . Chronic systolic congestive heart failure ([place])   . ESRD on dialysis ([place])   . Right hemiparesis ([place])   . Pressure injury of skin 12/20/2019  . Intraparenchymal hemorrhage of brain ([place]) 12/18/2019  . ICH (intracerebral hemorrhage) ([place]) 12/12/2019  . Viral disease 08/22/2019  . Chronic right-sided heart failure ([place]) 07/09/2019  . Supraventricular tachycardia ([place]) 07/09/2019  . Other cirrhosis of liver ([place]) 06/27/2019  . Heart failure ([place]) 02/21/2019  . Heart palpitations 08/14/2018  . Other fluid overload 07/27/2018  . Encounter for long-term (current)  use of other medications 01/18/2018  . Hypothyroidism 01/18/2018  . Myalgia and myositis 01/18/2018  . Chronic nephritis 01/18/2018  . Other interstitial pulmonary diseases with fibrosis in diseases classified elsewhere ([place]) 01/18/2018  . Other long term (current) drug therapy 01/18/2018  . Sleep apnea 01/18/2018  . Unspecified persistent mental disorders due to conditions classified elsewhere 01/18/2018  . Venous reflux 01/18/2018  . Vitamin B12 deficiency 01/18/2018  . Vitamin D deficiency 01/18/2018  . Obstructive lung disease ([place]) 12/30/2017   . Other pruritus 12/27/2017  . Eruption cyst 12/19/2017  . 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 ([place]) 11/28/2017  . Chronic cough 11/11/2017  . Pulmonary hypertension ([place]) 11/11/2017  . Pulmonary arterial hypertension ([place]) 10/07/2017  . Acute ITP ([place]) 01/05/2017  . ESRD (end stage renal disease) ([place]) 12/28/2016  . Encounter for removal of sutures 09/09/2016  . Coagulation defect, unspecified ([place]) 09/01/2016  . Underimmunization status 08/05/2016  . Hemolytic anemia ([place]) 07/26/2016  . Epistaxis, recurrent 07/26/2016  . Unspecified protein-calorie malnutrition ([place]) 07/13/2016  . Aftercare including intermittent dialysis ([place]) 07/07/2016  . Anemia in chronic kidney disease 07/07/2016  . Hypokalemia 07/07/2016  . Iron deficiency anemia, unspecified 07/07/2016  . 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 Nonrheumatic mitral (valve) prolapse 07/07/2016  . Other irritable bowel syndrome 07/07/2016  . Other secondary thrombocytopenia 07/07/2016  . Secondary hyperparathyroidism of renal origin ([place]) 07/07/2016  . Thrombocytopenia ([place]) 07/01/2016  . ARF (acute renal failure) ([place]) 06/25/2016  . Anxiety 06/25/2016  . Bilateral carpal tunnel syndrome 10/22/2015  . Chronic gout without tophus 10/22/2015  . Chronic nonintractable headache 10/08/2015  . Fibroid uterus 01/03/2012  . H/O vitamin D deficiency 01/03/2012  . Post-menopausal 01/03/2012  . Hereditary and idiopathic peripheral neuropathy 11/19/2011  . Intestinal malabsorption 11/19/2011  . Lichen planus 72/18/2883  . Low back pain 11/19/2011  . Diffuse spasm of esophagus 11/11/2011  . ESR raised 11/11/2011  . Postinflammatory pulmonary fibrosis ([place]) 11/11/2011  . Scleroderma ([place]) 11/16/2010  . Rheumatoid arthritis ([place]) 11/16/2010  . Raynaud's disease 11/16/2010  . Symptomatic menopausal or female climacteric states 11/16/2010    Zachery Conch MOT, OTR/L  04/11/2020, 4:26 PM  [facility] [address] [place], [place], [address] Phone: [phone]   Fax:  [phone]  Name: Katie Nunez MRN: [id number] Date of Birth: 04-24-1955

## 2020-04-11 NOTE — Addendum Note (Signed)
Addended by: Hulda Marin on: 04/11/2020 08:42 AM   Modules accepted: Orders

## 2020-04-16 ENCOUNTER — Ambulatory Visit: Payer: Medicare Other | Admitting: Occupational Therapy

## 2020-04-16 ENCOUNTER — Ambulatory Visit: Payer: Medicare Other

## 2020-04-18 ENCOUNTER — Ambulatory Visit: Payer: Medicare Other | Admitting: Occupational Therapy

## 2020-04-18 ENCOUNTER — Other Ambulatory Visit: Payer: Self-pay

## 2020-04-18 DIAGNOSIS — R2689 Other abnormalities of gait and mobility: Secondary | ICD-10-CM

## 2020-04-18 DIAGNOSIS — R2681 Unsteadiness on feet: Secondary | ICD-10-CM

## 2020-04-18 DIAGNOSIS — R278 Other lack of coordination: Secondary | ICD-10-CM | POA: Diagnosis not present

## 2020-04-18 DIAGNOSIS — M6281 Muscle weakness (generalized): Secondary | ICD-10-CM

## 2020-04-18 DIAGNOSIS — R29818 Other symptoms and signs involving the nervous system: Secondary | ICD-10-CM

## 2020-04-18 NOTE — Therapy (Addendum)
[facility] [address] [place], [place], [address] Phone: [phone]   Fax:  [phone]  Occupational Therapy Treatment  Patient Details  Name: Katie Nunez MRN: [id number] Date of Birth: [birth year] Referring Provider (OT): Molli Barrows   Encounter Date: 04/18/2020   OT End of Session - 04/18/20 1249    Visit Number 16    Number of Visits 25    Date for OT Re-Evaluation 05/16/20    Authorization Type UHC Medicare    Authorization - Visit Number 17    Authorization - Number of Visits 20    OT Start Time 1237    OT Stop Time 1315    OT Time Calculation (min) 38 min    Activity Tolerance Patient tolerated treatment well    Behavior During Therapy [facility] for tasks assessed/performed           Past Medical History:  Diagnosis Date  . Achalasia   . Anxiety   . Dysplasia of cervix, low grade (CIN 1)   . Environmental allergies    "all year long" (12/27/2016)  . ESRD (end stage renal disease) on dialysis [facility])    "TTS; [place]" (12/27/2016)  . Fibromyalgia   . GERD (gastroesophageal reflux disease)   . Gout   . Hypertension   . IBS (irritable bowel syndrome)   . MVP (mitral valve prolapse)   . RA (rheumatoid arthritis) (HCC)    FOLLOWED BY DR. SHANAHAN  . Raynaud's disease   . Scleroderma ([place])   . Seasonal allergies   . Thrombocytopenia ([place]) 07/01/2016   Acute fall to 13,000 07/01/16  . Tubular adenoma 01/08/2008   CECUM  . Vitamin D deficiency     Past Surgical History:  Procedure Laterality Date  . ANKLE FRACTURE SURGERY Right   . AV FISTULA PLACEMENT Left 06/28/2016   Procedure: left arm ARTERIOVENOUS (AV) FISTULA CREATION;  Surgeon: Rosetta Posner, MD;  Location: [facility];  Service: Vascular;  Laterality: Left;  . BASCILIC VEIN TRANSPOSITION Left 09/27/2016   Procedure: LEFT UPPER ARM CEPHALIC VEIN TRANSPOSITION;  Surgeon: Rosetta Posner, MD;  Location: [facility];  Service: Vascular;  Laterality:  Left;  . BREAST BIOPSY     "? side"  . CESAREAN SECTION  1994  . CO2 LASER OF CERVIX    . COLONOSCOPY W/ BIOPSIES  01/08/2008  . INSERTION OF DIALYSIS CATHETER Right 06/28/2016   Procedure: INSERTION OF DIALYSIS CATHETER, right internal jugular;  Surgeon: Rosetta Posner, MD;  Location: [facility];  Service: Vascular;  Laterality: Right;  . MYOMECTOMY    . NASAL ENDOSCOPY WITH EPISTAXIS CONTROL N/A 12/29/2019   Procedure: NASAL ENDOSCOPY WITH EPISTAXIS CONTROL;  Surgeon: Leta Baptist, MD;  Location: [facility] OR;  Service: ENT;  Laterality: N/A;  . PELVIC LAPAROSCOPY  2011  . superficial thrombophlebitis Left 07-2014    There were no vitals filed for this visit.   Subjective Assessment - 04/18/20 1247    Subjective  Deneis pain    Patient Stated Goals write and use RUE better, drive agin    Currently in Pain? No/denies               Treatment: Chest press with cane, shoulder extension with cane, wall slides with elbows on wall, x 10 reps,  wall pushups x 10 reps  Pt simulated loading and unlading drying, carrying a grocery bag full of laundry in RUE while amb with cane, min v.c Standing to fold laundry, min  v.c for posture and RUE functional use.                 OT Education - 04/18/20 1344    Education Details wall pushups, and wall slides with elbows against wall in pain free ROM    Person(s) Educated Patient    Methods Explanation;Demonstration;Handout    Comprehension Verbalized understanding;Returned demonstration;Verbal cues required            OT Short Term Goals - 04/09/20 1442      OT SHORT TERM GOAL #1   Title I with inital HEP.    Time 4    Period Weeks    Status Achieved      OT SHORT TERM GOAL #2   Title Pt will demonstrate improved fine motor coordination for ADLs as evidenced by decreasing 9 hole peg test score for RUE to 52 secs or less    Baseline RUE 60.56, LUE 32.81    Time 4    Period Weeks    Status Achieved   RUE 43.84s     OT SHORT TERM  GOAL #3   Title Pt will perform light home management/simple cooking tasks with supervision and min v.c    Time 4    Period Weeks    Status Achieved      OT SHORT TERM GOAL #4   Title Pt will demonstrate improved RUE functional use as eveidenced by increasing box/ blocks score to 25 blocks with RUE.    Time 4    Period Weeks    Status Achieved   RUE 30 blocks on 03/14/2020     OT SHORT TERM GOAL #5   Title Pt will write a short paragraph with at least 90% legibility.    Time 4    Period Weeks    Status Achieved   100% legibility with increased time            OT Long Term Goals - 04/09/20 1432      OT LONG TERM GOAL #1   Title I with updated HEP.-04/09/20    Status On-going   needs updates to HEP     OT LONG TERM GOAL #2   Title Pt will perform basic home managment/ cooking modified Independently    Status Partially Met   Pt has performed but she is not performing consistently, pt reports continued challenges.     OT LONG TERM GOAL #3   Title Pt will demonstrate improved fine motor coordination for ADLs as evidenced by decreasing 9 hole peg test score for RUE to 45 secs or less.    Status Achieved      OT LONG TERM GOAL #4   Title Pt will write a short paragraph with 100% legibility.    Status Achieved   with increased time     OT LONG TERM GOAL #5   Title Pt will demonstrate ability to retrieve a lightweight object at 125 shoulder flexion with RUE demonstrating good control. Revised  goal-Pt will retrieve a light weight object at 115 shoulder flexion with pain less than or equal to 2/10.    Time 5    Period Weeks    Status Revised   105 prior to pain, pt can reach highter but she has significant pain     Long Term Additional Goals   Additional Long Term Goals Yes      OT LONG TERM GOAL #6   Title Pt will report cooking a simple meal 2x  week modified Indpendently.    Time 5    Period Weeks    Status New      OT LONG TERM GOAL #7   Title Pt will perform laundry  and fold clothes modified independently.    Time 5    Period Weeks    Status New      OT LONG TERM GOAL #8   Title Pt will report increased ease with removing items form the oven, stirring food and  scrambling an egg with RUE    Time 5    Period Weeks    Status New      OT LONG TERM GOAL  #9   TITLE Pt will demonstrate improved fine motor coordination for ADLs as evidenced by decreasing 9 hole peg test score to 40 secs or less for RUE    Time 5    Period Weeks    Status New                 Plan - 04/18/20 1343    Clinical Impression Statement Pt demonstrates good overall progress towards goals. She demonstrated good endurance and functional use of RUE today.    OT Occupational Profile and History Detailed Assessment- Review of Records and additional review of physical, cognitive, psychosocial history related to current functional performance    Occupational performance deficits (Please refer to evaluation for details): ADL's;IADL's;Leisure;Rest and Sleep;Social Participation;Other    Body Structure / Function / Physical Skills ADL;Endurance;Decreased knowledge of precautions;Decreased knowledge of use of DME;Flexibility;IADL;Pain;Skin integrity;Edema;Mobility;ROM    Rehab Potential Good    OT Frequency 2x / week    OT Duration --   5 weeks   OT Treatment/Interventions Self-care/ADL training;Therapeutic exercise;DME and/or AE instruction;Therapist, nutritional;Compression bandaging;Other (comment);Manual Therapy;Scar mobilization;Coping strategies training;Energy conservation;Manual lymph drainage;Passive range of motion;Therapeutic activities    Plan continue to address posture, right scapular pain, progress to home management/ cooking tasks    Consulted and Agree with Plan of Care Patient           Patient will benefit from skilled therapeutic intervention in order to improve the following deficits and impairments:   Body Structure / Function / Physical Skills:  ADL,Endurance,Decreased knowledge of precautions,Decreased knowledge of use of DME,Flexibility,IADL,Pain,Skin integrity,Edema,Mobility,ROM       Visit Diagnosis: Other lack of coordination  Muscle weakness (generalized)  Unsteadiness on feet  Other symptoms and signs involving the nervous system  Other abnormalities of gait and mobility    Problem List Patient Active Problem List   Diagnosis Date Noted  . Abnormality of gait 02/25/2020  . Protein-calorie malnutrition, severe 01/19/2020  . Epistaxis   . Sleep disturbance   . Slow transit constipation   . Hemorrhoids   . Chronic systolic congestive heart failure ([place])   . ESRD on dialysis ([place])   . Right hemiparesis ([place])   . Pressure injury of skin 12/20/2019  . Intraparenchymal hemorrhage of brain ([place]) 12/18/2019  . ICH (intracerebral hemorrhage) ([place]) 12/12/2019  . Viral disease 08/22/2019  . Chronic right-sided heart failure ([place]) 07/09/2019  . Supraventricular tachycardia ([place]) 07/09/2019  . Other cirrhosis of liver ([place]) 06/27/2019  . Heart failure ([place]) 02/21/2019  . Heart palpitations 08/14/2018  . Other fluid overload 07/27/2018  . Encounter for long-term (current) use of other medications 01/18/2018  . Hypothyroidism 01/18/2018  . Myalgia and myositis 01/18/2018  . Chronic nephritis 01/18/2018  . Other interstitial pulmonary diseases with fibrosis in diseases classified elsewhere ([place]) 01/18/2018  . Other long term (  current) drug therapy 01/18/2018  . Sleep apnea 01/18/2018  . Unspecified persistent mental disorders due to conditions classified elsewhere 01/18/2018  . Venous reflux 01/18/2018  . Vitamin B12 deficiency 01/18/2018  . Vitamin D deficiency 01/18/2018  . Obstructive lung disease ([place]) 12/30/2017  . Other pruritus 12/27/2017  . Eruption cyst 12/19/2017  . 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 ([place]) 11/28/2017  . Chronic cough 11/11/2017  . Pulmonary  hypertension ([place]) 11/11/2017  . Pulmonary arterial hypertension ([place]) 10/07/2017  . Acute ITP ([place]) 01/05/2017  . ESRD (end stage renal disease) ([place]) 12/28/2016  . Encounter for removal of sutures 09/09/2016  . Coagulation defect, unspecified ([place]) 09/01/2016  . Underimmunization status 08/05/2016  . Hemolytic anemia ([place]) 07/26/2016  . Epistaxis, recurrent 07/26/2016  . Unspecified protein-calorie malnutrition ([place]) 07/13/2016  . Aftercare including intermittent dialysis ([place]) 07/07/2016  . Anemia in chronic kidney disease 07/07/2016  . Hypokalemia 07/07/2016  . Iron deficiency anemia, unspecified 07/07/2016  . 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 Nonrheumatic mitral (valve) prolapse 07/07/2016  . Other irritable bowel syndrome 07/07/2016  . Other secondary thrombocytopenia 07/07/2016  . Secondary hyperparathyroidism of renal origin ([place]) 07/07/2016  . Thrombocytopenia ([place]) 07/01/2016  . ARF (acute renal failure) ([place]) 06/25/2016  . Anxiety 06/25/2016  . Bilateral carpal tunnel syndrome 10/22/2015  . Chronic gout without tophus 10/22/2015  . Chronic nonintractable headache 10/08/2015  . Fibroid uterus 01/03/2012  . H/O vitamin D deficiency 01/03/2012  . Post-menopausal 01/03/2012  . Hereditary and idiopathic peripheral neuropathy 11/19/2011  . Intestinal malabsorption 11/19/2011  . Lichen planus 17/79/3903  . Low back pain 11/19/2011  . Diffuse spasm of esophagus 11/11/2011  . ESR raised 11/11/2011  . Postinflammatory pulmonary fibrosis ([place]) 11/11/2011  . Scleroderma ([place]) 11/16/2010  . Rheumatoid arthritis ([place]) 11/16/2010  . Raynaud's disease 11/16/2010  . Symptomatic menopausal or female climacteric states 11/16/2010    , 04/18/2020, 1:49 PM  [facility] [address] [place], [place], [address] Phone: [phone]   Fax:  [phone]  Name: Katie Nunez MRN: [id number] Date of Birth:  1955-12-18

## 2020-04-18 NOTE — Patient Instructions (Addendum)
SHOULDER: Flexion At Wall    Slide both arms up wall while leaning gently into wall. Maintain upright posture and tuck in stomach. Hold __5_ seconds. __10_ reps per set, _1__ sets per day, __5_ days per week stop before pain, do not go to end range  Copyright  VHI. All rights reserved.  Wall Push-Up: Double Arm    Stand  with both hands on wall. Perform a push-up. Repeat _10__ times per set. . Do 1___ sets per session.  http://plyo.exer.us/182   Copyright  VHI. All rights reserved.

## 2020-04-21 ENCOUNTER — Ambulatory Visit: Payer: Medicare Other | Admitting: Occupational Therapy

## 2020-04-22 ENCOUNTER — Ambulatory Visit: Payer: Medicare Other

## 2020-04-22 ENCOUNTER — Other Ambulatory Visit: Payer: Self-pay

## 2020-04-22 DIAGNOSIS — R2689 Other abnormalities of gait and mobility: Secondary | ICD-10-CM

## 2020-04-22 DIAGNOSIS — R2681 Unsteadiness on feet: Secondary | ICD-10-CM

## 2020-04-22 DIAGNOSIS — M6281 Muscle weakness (generalized): Secondary | ICD-10-CM

## 2020-04-22 NOTE — Therapy (Signed)
[facility] [address] [place], [place], [address] Phone: [phone]   Fax:  [phone]  Physical Therapy Treatment/Not seen  Patient Details  Name: Katie Nunez MRN: [id number] Date of Birth: [birth year] Referring Provider (PT): [name] MD   Encounter Date: 04/22/2020   PT End of Session - 04/22/20 1802    Visit Number 15    Number of Visits 16    Date for PT Re-Evaluation 04/13/20    Authorization Type UHC-MC    PT Start Time 1750    PT Stop Time 1800    PT Time Calculation (min) 10 min           Past Medical History:  Diagnosis Date  . Achalasia   . Anxiety   . Dysplasia of cervix, low grade (CIN 1)   . Environmental allergies    "all year long" (12/27/2016)  . ESRD (end stage renal disease) on dialysis [facility])    "TTS; [place]" (12/27/2016)  . Fibromyalgia   . GERD (gastroesophageal reflux disease)   . Gout   . Hypertension   . IBS (irritable bowel syndrome)   . MVP (mitral valve prolapse)   . RA (rheumatoid arthritis) (HCC)    FOLLOWED BY DR. SHANAHAN  . Raynaud's disease   . Scleroderma ([place])   . Seasonal allergies   . Thrombocytopenia ([place]) 07/01/2016   Acute fall to 13,000 07/01/16  . Tubular adenoma 01/08/2008   CECUM  . Vitamin D deficiency     Past Surgical History:  Procedure Laterality Date  . ANKLE FRACTURE SURGERY Right   . AV FISTULA PLACEMENT Left 06/28/2016   Procedure: left arm ARTERIOVENOUS (AV) FISTULA CREATION;  Surgeon: Rosetta Posner, MD;  Location: [facility];  Service: Vascular;  Laterality: Left;  . BASCILIC VEIN TRANSPOSITION Left 09/27/2016   Procedure: LEFT UPPER ARM CEPHALIC VEIN TRANSPOSITION;  Surgeon: Rosetta Posner, MD;  Location: [facility];  Service: Vascular;  Laterality: Left;  . BREAST BIOPSY     "? side"  . CESAREAN SECTION  1994  . CO2 LASER OF CERVIX    . COLONOSCOPY W/ BIOPSIES  01/08/2008  . INSERTION OF DIALYSIS CATHETER Right 06/28/2016    Procedure: INSERTION OF DIALYSIS CATHETER, right internal jugular;  Surgeon: Rosetta Posner, MD;  Location: [facility];  Service: Vascular;  Laterality: Right;  . MYOMECTOMY    . NASAL ENDOSCOPY WITH EPISTAXIS CONTROL N/A 12/29/2019   Procedure: NASAL ENDOSCOPY WITH EPISTAXIS CONTROL;  Surgeon: Leta Baptist, MD;  Location: [facility] OR;  Service: ENT;  Laterality: N/A;  . PELVIC LAPAROSCOPY  2011  . superficial thrombophlebitis Left 07-2014    There were no vitals filed for this visit.   Subjective Assessment - 04/22/20 1801    Subjective Arrived for session but reportd she is not feeling well following HD this AM    Patient is accompained by: Family member    Pertinent History Scleroderma, RA, pulmonary HTN, kidney disease (dialysis) Tu,TH and Sat, Rt ankle fx/dislocation/surgery 2000    Limitations Walking    How long can you sit comfortably? unlimited    How long can you stand comfortably? unlimited    How long can you walk comfortably? unlimited    Patient Stated Goals strengthen ankle and knee R, get off of cane    Pain Onset More than a month ago  PT Short Term Goals - 03/12/20 1612      PT SHORT TERM GOAL #1   Title Ind with initial HEP and able to demo back to PT w/o need of VCs    Baseline no HEP; 03/12/20 demos HEP w/o need of VCs    Time 2    Period Weeks    Status Achieved    Target Date 02/27/20      PT SHORT TERM GOAL #2   Title Patient to obtain [facility] and bring it to PT sessions for adjustment and training    Baseline not using cane prior; 03/12/20 Has been using cane in clinic, adjusted to her body habitus    Time 2    Period Weeks    Status Achieved    Target Date 02/27/20             PT Long Term Goals - 03/26/20 1816      PT LONG TERM GOAL #1   Title Patient to improve 5x STS score to < 20 sec    Baseline 24 seconds; NT    Time 8    Period Weeks    Status On-going    Target Date 04/23/20      PT LONG  TERM GOAL #2   Title Patient to demo 4+/5 R ankle/knee strength throughout    Baseline 4/5 R ankle knee strength; 03/26/20 No changes    Time 8    Period Weeks    Status On-going    Target Date 04/23/20      PT LONG TERM GOAL #3   Title Patient to decerease TUG time to ,13.5 sec w/o AD    Baseline 18 sec TUG average of 2 trials; NT    Time 8    Period Weeks    Status On-going    Target Date 04/23/20      PT LONG TERM GOAL #4   Title Ambulate 1080f with LRAD across outdoor surfaces under distant S    Baseline limited to in home ambulation; [facility] 20[facility]    Time 8    Period Weeks    Status On-going    Target Date 04/23/20      PT LONG TERM GOAL #5   Title Return to driving as appropriate    Baseline currently not driving; 03/12/33/36o change    Time 8    Period Weeks    Status On-going    Target Date 04/23/20                 Plan - 04/22/20 1803    Clinical Impression Statement patient arrived for scheduled visit but reporting not feeling well following HD this AM, elected to reschedule session    Personal Factors and Comorbidities Age;Comorbidity 2    Comorbidities ESRD(HD T, Th, Sat), R ankle fx 2000    Examination-Participation Restrictions Community Activity    Stability/Clinical Decision Making Stable/Uncomplicated    Rehab Potential Good    PT Frequency 2x / week    PT Duration 4 weeks    PT Treatment/Interventions ADLs/Self Care Home Management;Cryotherapy;Electrical Stimulation;Ultrasound;Therapeutic exercise;Balance training;Patient/family education;Manual techniques;Dry needling;Therapeutic activities;Neuromuscular re-education;DME Instruction;Gait training;Stair training;Functional mobility training;Orthotic Fit/Training    PT Next Visit Plan Assess goals and need for additonal PT services    Consulted and Agree with Plan of Care Patient           Patient will benefit from skilled therapeutic intervention in order to  improve  the following deficits and impairments:  Postural dysfunction,Decreased balance,Impaired flexibility,Abnormal gait,Decreased mobility,Difficulty walking,Decreased coordination  Visit Diagnosis: Unsteadiness on feet  Muscle weakness (generalized)  Other abnormalities of gait and mobility     Problem List Patient Active Problem List   Diagnosis Date Noted  . Abnormality of gait 02/25/2020  . Protein-calorie malnutrition, severe 01/19/2020  . Epistaxis   . Sleep disturbance   . Slow transit constipation   . Hemorrhoids   . Chronic systolic congestive heart failure ([place])   . ESRD on dialysis ([place])   . Right hemiparesis ([place])   . Pressure injury of skin 12/20/2019  . Intraparenchymal hemorrhage of brain ([place]) 12/18/2019  . ICH (intracerebral hemorrhage) ([place]) 12/12/2019  . Viral disease 08/22/2019  . Chronic right-sided heart failure ([place]) 07/09/2019  . Supraventricular tachycardia ([place]) 07/09/2019  . Other cirrhosis of liver ([place]) 06/27/2019  . Heart failure ([place]) 02/21/2019  . Heart palpitations 08/14/2018  . Other fluid overload 07/27/2018  . Encounter for long-term (current) use of other medications 01/18/2018  . Hypothyroidism 01/18/2018  . Myalgia and myositis 01/18/2018  . Chronic nephritis 01/18/2018  . Other interstitial pulmonary diseases with fibrosis in diseases classified elsewhere ([place]) 01/18/2018  . Other long term (current) drug therapy 01/18/2018  . Sleep apnea 01/18/2018  . Unspecified persistent mental disorders due to conditions classified elsewhere 01/18/2018  . Venous reflux 01/18/2018  . Vitamin B12 deficiency 01/18/2018  . Vitamin D deficiency 01/18/2018  . Obstructive lung disease ([place]) 12/30/2017  . Other pruritus 12/27/2017  . Eruption cyst 12/19/2017  . 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 ([employer]) 11/28/2017  . Chronic cough 11/11/2017  . Pulmonary hypertension ([place]) 11/11/2017  . Pulmonary arterial  hypertension ([place]) 10/07/2017  . Acute ITP ([place]) 01/05/2017  . ESRD (end stage renal disease) ([place]) 12/28/2016  . Encounter for removal of sutures 09/09/2016  . Coagulation defect, unspecified ([place]) 09/01/2016  . Underimmunization status 08/05/2016  . Hemolytic anemia ([place]) 07/26/2016  . Epistaxis, recurrent 07/26/2016  . Unspecified protein-calorie malnutrition ([place]) 07/13/2016  . Aftercare including intermittent dialysis ([place]) 07/07/2016  . Anemia in chronic kidney disease 07/07/2016  . Hypokalemia 07/07/2016  . Iron deficiency anemia, unspecified 07/07/2016  . 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 Nonrheumatic mitral (valve) prolapse 07/07/2016  . Other irritable bowel syndrome 07/07/2016  . Other secondary thrombocytopenia 07/07/2016  . Secondary hyperparathyroidism of renal origin ([place]) 07/07/2016  . Thrombocytopenia ([place]) 07/01/2016  . ARF (acute renal failure) ([place]) 06/25/2016  . Anxiety 06/25/2016  . Bilateral carpal tunnel syndrome 10/22/2015  . Chronic gout without tophus 10/22/2015  . Chronic nonintractable headache 10/08/2015  . Fibroid uterus 01/03/2012  . H/O vitamin D deficiency 01/03/2012  . Post-menopausal 01/03/2012  . Hereditary and idiopathic peripheral neuropathy 11/19/2011  . Intestinal malabsorption 11/19/2011  . Lichen planus 41/63/8453  . Low back pain 11/19/2011  . Diffuse spasm of esophagus 11/11/2011  . ESR raised 11/11/2011  . Postinflammatory pulmonary fibrosis ([place]) 11/11/2011  . Scleroderma ([place]) 11/16/2010  . Rheumatoid arthritis ([place]) 11/16/2010  . Raynaud's disease 11/16/2010  . Symptomatic menopausal or female climacteric states 11/16/2010    Lanice Shirts PT 04/22/2020, 6:05 PM  [facility] [address] [place], [place], [address] Phone: [phone]   Fax:  [phone]  Name: Katie Nunez MRN: [id number] Date of Birth: 1955/08/21

## 2020-04-23 ENCOUNTER — Ambulatory Visit: Payer: Medicare Other | Admitting: Occupational Therapy

## 2020-04-23 ENCOUNTER — Encounter: Payer: Self-pay | Admitting: Occupational Therapy

## 2020-04-23 DIAGNOSIS — R29818 Other symptoms and signs involving the nervous system: Secondary | ICD-10-CM

## 2020-04-23 DIAGNOSIS — R2689 Other abnormalities of gait and mobility: Secondary | ICD-10-CM

## 2020-04-23 DIAGNOSIS — R2681 Unsteadiness on feet: Secondary | ICD-10-CM

## 2020-04-23 DIAGNOSIS — R278 Other lack of coordination: Secondary | ICD-10-CM

## 2020-04-23 DIAGNOSIS — M6281 Muscle weakness (generalized): Secondary | ICD-10-CM

## 2020-04-23 NOTE — Therapy (Signed)
[facility] [address] [place], [place], [address] Phone: [phone]   Fax:  [phone]  Occupational Therapy Treatment  Patient Details  Name: Katie Nunez MRN: [id number] Date of Birth: 1955/11/06 Referring Provider (OT): Molli Barrows   Encounter Date: 04/23/2020   OT End of Session - 04/23/20 1455    Visit Number 18    Number of Visits 25    Date for OT Re-Evaluation 05/16/20    Authorization Type UHC Medicare    Authorization - Visit Number 18    Authorization - Number of Visits 20    OT Start Time 7262   pt arrived late   OT Stop Time 1530    OT Time Calculation (min) 35 min    Activity Tolerance Patient tolerated treatment well    Behavior During Therapy [facility] for tasks assessed/performed           Past Medical History:  Diagnosis Date  . Achalasia   . Anxiety   . Dysplasia of cervix, low grade (CIN 1)   . Environmental allergies    "all year long" (12/27/2016)  . ESRD (end stage renal disease) on dialysis [facility])    "TTS; [place]" (12/27/2016)  . Fibromyalgia   . GERD (gastroesophageal reflux disease)   . Gout   . Hypertension   . IBS (irritable bowel syndrome)   . MVP (mitral valve prolapse)   . RA (rheumatoid arthritis) (HCC)    FOLLOWED BY DR. SHANAHAN  . Raynaud's disease   . Scleroderma ([place])   . Seasonal allergies   . Thrombocytopenia ([place]) 07/01/2016   Acute fall to 13,000 07/01/16  . Tubular adenoma 01/08/2008   CECUM  . Vitamin D deficiency     Past Surgical History:  Procedure Laterality Date  . ANKLE FRACTURE SURGERY Right   . AV FISTULA PLACEMENT Left 06/28/2016   Procedure: left arm ARTERIOVENOUS (AV) FISTULA CREATION;  Surgeon: Rosetta Posner, MD;  Location: [place];  Service: Vascular;  Laterality: Left;  . BASCILIC VEIN TRANSPOSITION Left 09/27/2016   Procedure: LEFT UPPER ARM CEPHALIC VEIN TRANSPOSITION;  Surgeon: Rosetta Posner, MD;  Location: [facility];  Service:  Vascular;  Laterality: Left;  . BREAST BIOPSY     "? side"  . CESAREAN SECTION  1994  . CO2 LASER OF CERVIX    . COLONOSCOPY W/ BIOPSIES  01/08/2008  . INSERTION OF DIALYSIS CATHETER Right 06/28/2016   Procedure: INSERTION OF DIALYSIS CATHETER, right internal jugular;  Surgeon: Rosetta Posner, MD;  Location: [facility] [place];  Service: Vascular;  Laterality: Right;  . MYOMECTOMY    . NASAL ENDOSCOPY WITH EPISTAXIS CONTROL N/A 12/29/2019   Procedure: NASAL ENDOSCOPY WITH EPISTAXIS CONTROL;  Surgeon: Leta Baptist, MD;  Location: [facility] OR;  Service: ENT;  Laterality: N/A;  . PELVIC LAPAROSCOPY  2011  . superficial thrombophlebitis Left 07-2014    There were no vitals filed for this visit.   Subjective Assessment - 04/23/20 1455    Subjective  Pt denies any pain.    Pertinent History Scleroderma, RA,HTN, ESRD (dialysis), hx R ankle Fx, 2000, anxiety,fibromyalgia, gout, HTN, MVP, Raynaud's  disease, C section, ostructive lung disease, B CTS, hereditary and periferal neuropathy, venous reflux, ICH    Limitations NO BP LUE due to fistula    Patient Stated Goals write and use RUE better, drive agin    Currently in Pain? No/denies             TREATMENT:  ADLs: Pt reports doing some light meal prep (oatmeal, cereal, warming up meals prepared already) and mixing things together. Pt is not using can opener or cutting up any vegetables or fruits right now. OT encouraged patient to try doing more with supervision for increasing skills and functional use.  Cooking made scrambled eggs with supervision this day. Pt required increased time and switched between right and left hand for whisking eggs for scramble. Pt with decrease circumduction of wrist for whisking in RUE.  Forearm Gym with RUE for increase in wrist mobility.                    OT Education - 04/23/20 1525    Education Details AROM of wrist for increasing mobility/flexibility    Person(s) Educated Patient    Methods  Explanation;Demonstration;Handout    Comprehension Verbalized understanding;Returned demonstration;Verbal cues required            OT Short Term Goals - 04/09/20 1442      OT SHORT TERM GOAL #1   Title I with inital HEP.    Time 4    Period Weeks    Status Achieved      OT SHORT TERM GOAL #2   Title Pt will demonstrate improved fine motor coordination for ADLs as evidenced by decreasing 9 hole peg test score for RUE to 52 secs or less    Baseline RUE 60.56, LUE 32.81    Time 4    Period Weeks    Status Achieved   RUE 43.84s     OT SHORT TERM GOAL #3   Title Pt will perform light home management/simple cooking tasks with supervision and min v.c    Time 4    Period Weeks    Status Achieved      OT SHORT TERM GOAL #4   Title Pt will demonstrate improved RUE functional use as eveidenced by increasing box/ blocks score to 25 blocks with RUE.    Time 4    Period Weeks    Status Achieved   RUE 30 blocks on 03/14/2020     OT SHORT TERM GOAL #5   Title Pt will write a short paragraph with at least 90% legibility.    Time 4    Period Weeks    Status Achieved   100% legibility with increased time            OT Long Term Goals - 04/09/20 1432      OT LONG TERM GOAL #1   Title I with updated HEP.-04/09/20    Status On-going   needs updates to HEP     OT LONG TERM GOAL #2   Title Pt will perform basic home managment/ cooking modified Independently    Status Partially Met   Pt has performed but she is not performing consistently, pt reports continued challenges.     OT LONG TERM GOAL #3   Title Pt will demonstrate improved fine motor coordination for ADLs as evidenced by decreasing 9 hole peg test score for RUE to 45 secs or less.    Status Achieved      OT LONG TERM GOAL #4   Title Pt will write a short paragraph with 100% legibility.    Status Achieved   with increased time     OT LONG TERM GOAL #5   Title Pt will demonstrate ability to retrieve a lightweight object at  125 shoulder flexion with RUE demonstrating good control. Revised  goal-Pt  will retrieve a light weight object at 115 shoulder flexion with pain less than or equal to 2/10.    Time 5    Period Weeks    Status Revised   105 prior to pain, pt can reach highter but she has significant pain     Long Term Additional Goals   Additional Long Term Goals Yes      OT LONG TERM GOAL #6   Title Pt will report cooking a simple meal 2x week modified Indpendently.    Time 5    Period Weeks    Status New      OT LONG TERM GOAL #7   Title Pt will perform laundry and fold clothes modified independently.    Time 5    Period Weeks    Status New      OT LONG TERM GOAL #8   Title Pt will report increased ease with removing items form the oven, stirring food and  scrambling an egg with RUE    Time 5    Period Weeks    Status New      OT LONG TERM GOAL  #9   TITLE Pt will demonstrate improved fine motor coordination for ADLs as evidenced by decreasing 9 hole peg test score to 40 secs or less for RUE    Time 5    Period Weeks    Status New                 Plan - 04/23/20 1519    Clinical Impression Statement Pt encouraged to increase participation and independence with ADLs and IADLs at home.    OT Occupational Profile and History Detailed Assessment- Review of Records and additional review of physical, cognitive, psychosocial history related to current functional performance    Occupational performance deficits (Please refer to evaluation for details): ADL's;IADL's;Leisure;Rest and Sleep;Social Participation;Other    Body Structure / Function / Physical Skills ADL;Endurance;Decreased knowledge of precautions;Decreased knowledge of use of DME;Flexibility;IADL;Pain;Skin integrity;Edema;Mobility;ROM    Rehab Potential Good    OT Frequency 2x / week    OT Duration --   5 weeks   OT Treatment/Interventions Self-care/ADL training;Therapeutic exercise;DME and/or AE instruction;Wellsite geologist;Compression bandaging;Other (comment);Manual Therapy;Scar mobilization;Coping strategies training;Energy conservation;Manual lymph drainage;Passive range of motion;Therapeutic activities    Plan continue to address posture, right scapular pain, progress to home management/ cooking tasks    Consulted and Agree with Plan of Care Patient           Patient will benefit from skilled therapeutic intervention in order to improve the following deficits and impairments:   Body Structure / Function / Physical Skills: ADL,Endurance,Decreased knowledge of precautions,Decreased knowledge of use of DME,Flexibility,IADL,Pain,Skin integrity,Edema,Mobility,ROM       Visit Diagnosis: Unsteadiness on feet  Muscle weakness (generalized)  Other abnormalities of gait and mobility  Other symptoms and signs involving the nervous system  Other lack of coordination    Problem List Patient Active Problem List   Diagnosis Date Noted  . Abnormality of gait 02/25/2020  . Protein-calorie malnutrition, severe 01/19/2020  . Epistaxis   . Sleep disturbance   . Slow transit constipation   . Hemorrhoids   . Chronic systolic congestive heart failure ([place])   . ESRD on dialysis ([place])   . Right hemiparesis ([place])   . Pressure injury of skin 12/20/2019  . Intraparenchymal hemorrhage of brain ([place]) 12/18/2019  . ICH (intracerebral hemorrhage) ([place]) 12/12/2019  . Viral disease 08/22/2019  . Chronic right-sided heart  failure ([place]) 07/09/2019  . Supraventricular tachycardia ([place]) 07/09/2019  . Other cirrhosis of liver ([place]) 06/27/2019  . Heart failure ([place]) 02/21/2019  . Heart palpitations 08/14/2018  . Other fluid overload 07/27/2018  . Encounter for long-term (current) use of other medications 01/18/2018  . Hypothyroidism 01/18/2018  . Myalgia and myositis 01/18/2018  . Chronic nephritis 01/18/2018  . Other interstitial pulmonary diseases with fibrosis in diseases classified elsewhere ([place])  01/18/2018  . Other long term (current) drug therapy 01/18/2018  . Sleep apnea 01/18/2018  . Unspecified persistent mental disorders due to conditions classified elsewhere 01/18/2018  . Venous reflux 01/18/2018  . Vitamin B12 deficiency 01/18/2018  . Vitamin D deficiency 01/18/2018  . Obstructive lung disease ([place]) 12/30/2017  . Other pruritus 12/27/2017  . Eruption cyst 12/19/2017  . 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 Chronic cough 11/11/2017  . Pulmonary hypertension ([place]) 11/11/2017  . Pulmonary arterial hypertension ([place]) 10/07/2017  . Acute ITP ([place]) 01/05/2017  . ESRD (end stage renal disease) ([place]) 12/28/2016  . Encounter for removal of sutures 09/09/2016  . Coagulation defect, unspecified ([place]) 09/01/2016  . Underimmunization status 08/05/2016  . Hemolytic anemia ([place]) 07/26/2016  . Epistaxis, recurrent 07/26/2016  . Unspecified protein-calorie malnutrition ([place]) 07/13/2016  . Aftercare including intermittent dialysis ([place]) 07/07/2016  . Anemia in chronic kidney disease 07/07/2016  . Hypokalemia 07/07/2016  . Iron deficiency anemia, unspecified 07/07/2016  . 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 Nonrheumatic mitral (valve) prolapse 07/07/2016  . Other irritable bowel syndrome 07/07/2016  . Other secondary thrombocytopenia 07/07/2016  . Secondary hyperparathyroidism of renal origin ([place]) 07/07/2016  . Thrombocytopenia ([place]) 07/01/2016  . ARF (acute renal failure) ([place]) 06/25/2016  . Anxiety 06/25/2016  . Bilateral carpal tunnel syndrome 10/22/2015  . Chronic gout without tophus 10/22/2015  . Chronic nonintractable headache 10/08/2015  . Fibroid uterus 01/03/2012  . H/O vitamin D deficiency 01/03/2012  . Post-menopausal 01/03/2012  . Hereditary and idiopathic peripheral neuropathy 11/19/2011  . Intestinal malabsorption 11/19/2011  . Lichen planus 66/44/0347  . Low back pain 11/19/2011  . Diffuse spasm of esophagus  11/11/2011  . ESR raised 11/11/2011  . Postinflammatory pulmonary fibrosis ([place]) 11/11/2011  . Scleroderma ([place]) 11/16/2010  . Rheumatoid arthritis ([place]) 11/16/2010  . Raynaud's disease 11/16/2010  . Symptomatic menopausal or female climacteric states 11/16/2010    Zachery Conch MOT, OTR/L  04/23/2020, 4:10 PM  [facility] [address] [place], [place], [address] Phone: [phone]   Fax:  [phone]  Name: Katie Nunez MRN: [id number] Date of Birth: [birth year]

## 2020-04-23 NOTE — Patient Instructions (Signed)
AROM: Wrist Extension   .  With ____ palm down, bend wrist up. Repeat __15__ times per set.  Do __4-6__ sessions per day.    AROM: Wrist Flexion   With_____ palm up, bend wrist up. Repeat __15__ times per set.  Do _4-6___ sessions per day.   AROM: Forearm Pronation / Supination   With ____ arm in handshake position, slowly rotate palm down until stretch is felt. Relax. Then rotate palm up until stretch is felt. Repeat _15___ times per set. Do _4-6___ sessions per day.  Circumduction (Active)    With fingers curled, move slowly at wrist in clock- wise circles _15_ times. Repeat counterclockwise. Do not move elbow or shoulder. Do _4-6_ sessions per day.  Copyright  VHI. All rights reserved.    Copyright  VHI. All rights reserved.

## 2020-04-30 ENCOUNTER — Ambulatory Visit: Payer: Medicare Other

## 2020-04-30 ENCOUNTER — Encounter: Payer: Self-pay | Admitting: Podiatrist

## 2020-04-30 ENCOUNTER — Other Ambulatory Visit: Payer: Self-pay

## 2020-04-30 ENCOUNTER — Ambulatory Visit: Payer: Medicare Other | Admitting: Podiatrist

## 2020-04-30 ENCOUNTER — Ambulatory Visit: Payer: Medicare Other | Admitting: Occupational Therapy

## 2020-04-30 DIAGNOSIS — R2689 Other abnormalities of gait and mobility: Secondary | ICD-10-CM

## 2020-04-30 DIAGNOSIS — B351 Tinea unguium: Secondary | ICD-10-CM | POA: Diagnosis not present

## 2020-04-30 DIAGNOSIS — R2681 Unsteadiness on feet: Secondary | ICD-10-CM

## 2020-04-30 DIAGNOSIS — L84 Corns and callosities: Secondary | ICD-10-CM

## 2020-04-30 DIAGNOSIS — M79675 Pain in left toe(s): Secondary | ICD-10-CM

## 2020-04-30 DIAGNOSIS — M6281 Muscle weakness (generalized): Secondary | ICD-10-CM

## 2020-04-30 DIAGNOSIS — R29818 Other symptoms and signs involving the nervous system: Secondary | ICD-10-CM

## 2020-04-30 DIAGNOSIS — R278 Other lack of coordination: Secondary | ICD-10-CM

## 2020-04-30 DIAGNOSIS — M79676 Pain in unspecified toe(s): Secondary | ICD-10-CM

## 2020-04-30 DIAGNOSIS — M79674 Pain in right toe(s): Secondary | ICD-10-CM

## 2020-04-30 NOTE — Patient Instructions (Addendum)
Each day try to perform one of these tasks:  Place dishes/ cups in the dish washer   Wash clothes  Fold laundry  Sarajane Jews or scramble an egg  [employer] a snack  Wipe down the [employer] may want to consider purchasing a cutting board that has a spike/ nail to hold your food items down while cutting, exercise extreme coauntion while cutting, sit down and go slow.    1. Grip Strengthening (Resistive Putty)   Squeeze putty using thumb and all fingers. Repeat _20___ times. Do __2__ sessions per day.   2. Roll putty into tube on table and pinch between each finger and thumb x 10 reps each. (can do ring and small finger together)     Copyright  VHI. All rights reserved.       1. Grip Strengthening (Resistive Putty)   Squeeze putty using thumb and all fingers. Repeat _20___ times. Do __2__ sessions per day.   2. Roll putty into tube on table and pinch between each finger and thumb x 10 reps each. (can do ring and small finger together)     Copyright  VHI. All rights reserved.

## 2020-04-30 NOTE — Patient Instructions (Signed)

## 2020-04-30 NOTE — Therapy (Signed)
[facility] [address] [place], [place], [address] Phone: [phone]   Fax:  [phone]  Physical Therapy Treatment  Patient Details  Name: Katie Nunez MRN: [id number] Date of Birth: 1955-10-17 Referring Provider (PT): [name] MD   Encounter Date: 04/30/2020   PT End of Session - 04/30/20 1452    Visit Number 16    Number of Visits 20    Date for PT Re-Evaluation 06/04/20    Authorization Type UHC-MC    PT Start Time 3662    PT Stop Time 1530    PT Time Calculation (min) 45 min    Equipment Utilized During Treatment Gait belt    Activity Tolerance Patient tolerated treatment well    Behavior During Therapy [facility] for tasks assessed/performed           Past Medical History:  Diagnosis Date  . Achalasia   . Anxiety   . Dysplasia of cervix, low grade (CIN 1)   . Environmental allergies    "all year long" (12/27/2016)  . ESRD (end stage renal disease) on dialysis [facility])    "TTS; [place]" (12/27/2016)  . Fibromyalgia   . GERD (gastroesophageal reflux disease)   . Gout   . Hypertension   . IBS (irritable bowel syndrome)   . MVP (mitral valve prolapse)   . RA (rheumatoid arthritis) (HCC)    FOLLOWED BY DR. SHANAHAN  . Raynaud's disease   . Scleroderma ([place])   . Seasonal allergies   . Thrombocytopenia ([place]) 07/01/2016   Acute fall to 13,000 07/01/16  . Tubular adenoma 01/08/2008   CECUM  . Vitamin D deficiency     Past Surgical History:  Procedure Laterality Date  . ANKLE FRACTURE SURGERY Right   . AV FISTULA PLACEMENT Left 06/28/2016   Procedure: left arm ARTERIOVENOUS (AV) FISTULA CREATION;  Surgeon: Rosetta Posner, MD;  Location: [place];  Service: Vascular;  Laterality: Left;  . BASCILIC VEIN TRANSPOSITION Left 09/27/2016   Procedure: LEFT UPPER ARM CEPHALIC VEIN TRANSPOSITION;  Surgeon: Rosetta Posner, MD;  Location: [place];  Service: Vascular;  Laterality: Left;  . BREAST BIOPSY     "?  side"  . CESAREAN SECTION  1994  . CO2 LASER OF CERVIX    . COLONOSCOPY W/ BIOPSIES  01/08/2008  . INSERTION OF DIALYSIS CATHETER Right 06/28/2016   Procedure: INSERTION OF DIALYSIS CATHETER, right internal jugular;  Surgeon: Rosetta Posner, MD;  Location: [place];  Service: Vascular;  Laterality: Right;  . MYOMECTOMY    . NASAL ENDOSCOPY WITH EPISTAXIS CONTROL N/A 12/29/2019   Procedure: NASAL ENDOSCOPY WITH EPISTAXIS CONTROL;  Surgeon: Leta Baptist, MD;  Location: [facility];  Service: ENT;  Laterality: N/A;  . PELVIC LAPAROSCOPY  2011  . superficial thrombophlebitis Left 07-2014    There were no vitals filed for this visit.   Subjective Assessment - 04/30/20 1449    Subjective Rates herself at 60%, still reports difficulty with fatigue and lifting RLE, report sshe has a hard time finding te energy to perform tasks    Patient is accompained by: Family member    Pertinent History Scleroderma, RA, pulmonary HTN, kidney disease (dialysis) Tu,TH and Sat, Rt ankle fx/dislocation/surgery 2000    Limitations Walking    How long can you sit comfortably? unlimited    How long can you stand comfortably? unlimited    How long can you walk comfortably? unlimited    Patient Stated Goals strengthen ankle and  knee R, get off of cane    Currently in Pain? No/denies    Pain Onset More than a month ago              [facility] PT Assessment - 04/30/20 0001      Strength   Right Hip ABduction 4/5    Left Hip ABduction 4/5      Timed Up and Go Test   Normal TUG (seconds) 15.36    TUG Comments no AD used                         [facility] Adult PT Treatment/Exercise - 04/30/20 0001      Ambulation/Gait   Ambulation/Gait Yes    Ambulation/Gait Assistance 5: Supervision    Ambulation/Gait Assistance Details cued to facilitate hip and knee flexion on R    Ambulation Distance (Feet) 500 Feet    Assistive device Straight cane    Gait Pattern Decreased arm swing - right;Step-through pattern     Ambulation Surface Level;Indoor;Outdoor                    PT Short Term Goals - 04/30/20 1453      PT SHORT TERM GOAL #1   Title Ind with initial HEP and able to demo back to PT w/o need of VCs    Baseline no HEP; 03/12/20 demos HEP w/o need of VCs    Time 2    Period Weeks    Status Achieved    Target Date 02/27/20      PT SHORT TERM GOAL #2   Title Patient to obtain [facility] and bring it to PT sessions for adjustment and training    Baseline not using cane prior; 03/12/20 Has been using cane in clinic, adjusted to her body habitus    Time 2    Period Weeks    Status Achieved    Target Date 02/27/20             PT Long Term Goals - 04/30/20 1453      PT LONG TERM GOAL #1   Title Patient to improve 5x STS score to < 20 sec    Baseline 24 seconds; NT; 5x STS score 16.50s    Time 8    Period Weeks    Status Achieved      PT LONG TERM GOAL #2   Title Patient to demo 4+/5 R hip/knee strength throughout    Baseline 4/5 R ankle knee strength; 03/26/20 No changes; 04/30/20 MMT 4+/5 strength in RLE hip/knee with B abd 4/5    Time 8    Period Weeks    Status On-going      PT LONG TERM GOAL #3   Title Patient to decerease TUG time to ,13.5 sec w/o AD    Baseline 18 sec TUG average of 2 trials; NT; 04/30/20 TUG time 15.36s    Time 8    Period Weeks    Status On-going      PT LONG TERM GOAL #4   Title Ambulate 1024f with LRAD across outdoor surfaces under distant S    Baseline limited to in home ambulation; Ambulation limitd to 2068foutside with [facility]nd S; 04/30/20 ambulation distance limited to 5004fue to fatigue    Time 8    Period Weeks    Status On-going      PT LONG TERM GOAL #5   Title Return to driving as  appropriate    Baseline currently not driving; 3/66/44 no change;  04/29/20 Has returned to short distance driving    Time 8    Period Weeks    Status Partially Met                 Plan - 04/30/20 1452    Clinical Impression Statement Todays  skilled session consisted of re-assessment of progress made towards LTGs with plan to recertify for an additional 4 visits.  Most goals have been met with deficits noted in B hip abduction strength and endurance.  Patient has physical strength to perform RLE swing through but fatigues quickly leading to R foot scuffing    Personal Factors and Comorbidities Age;Comorbidity 2    Comorbidities ESRD(HD T, Th, Sat), R ankle fx 2000    Examination-Participation Restrictions Community Activity    Stability/Clinical Decision Making Stable/Uncomplicated    Rehab Potential Good    PT Frequency 2x / week    PT Duration 4 weeks    PT Treatment/Interventions ADLs/Self Care Home Management;Cryotherapy;Electrical Stimulation;Ultrasound;Therapeutic exercise;Balance training;Patient/family education;Manual techniques;Dry needling;Therapeutic activities;Neuromuscular re-education;DME Instruction;Gait training;Stair training;Functional mobility training;Orthotic Fit/Training    PT Next Visit Plan Continue 4 additional sessions with focus on improving gait cadence and B hip abduction strength    PT Home Exercise Plan heel/toe raises, sidestepping against green band, retrowalikng    Consulted and Agree with Plan of Care Patient           Patient will benefit from skilled therapeutic intervention in order to improve the following deficits and impairments:  Postural dysfunction,Decreased balance,Impaired flexibility,Abnormal gait,Decreased mobility,Difficulty walking,Decreased coordination  Visit Diagnosis: Unsteadiness on feet  Muscle weakness (generalized)  Other abnormalities of gait and mobility     Problem List Patient Active Problem List   Diagnosis Date Noted  . Abnormality of gait 02/25/2020  . Protein-calorie malnutrition, severe 01/19/2020  . Epistaxis   . Sleep disturbance   . Slow transit constipation   . Hemorrhoids   . Chronic systolic congestive heart failure ([place])   . ESRD on dialysis  ([place])   . Right hemiparesis ([place])   . Pressure injury of skin 12/20/2019  . Intraparenchymal hemorrhage of brain ([place]) 12/18/2019  . ICH (intracerebral hemorrhage) ([place]) 12/12/2019  . Viral disease 08/22/2019  . Chronic right-sided heart failure ([place]) 07/09/2019  . Supraventricular tachycardia ([place]) 07/09/2019  . Other cirrhosis of liver ([place]) 06/27/2019  . Heart failure ([place]) 02/21/2019  . Heart palpitations 08/14/2018  . Other fluid overload 07/27/2018  . Encounter for long-term (current) use of other medications 01/18/2018  . Hypothyroidism 01/18/2018  . Myalgia and myositis 01/18/2018  . Chronic nephritis 01/18/2018  . Other interstitial pulmonary diseases with fibrosis in diseases classified elsewhere ([place]) 01/18/2018  . Other long term (current) drug therapy 01/18/2018  . Sleep apnea 01/18/2018  . Unspecified persistent mental disorders due to conditions classified elsewhere 01/18/2018  . Venous reflux 01/18/2018  . Vitamin B12 deficiency 01/18/2018  . Vitamin D deficiency 01/18/2018  . Obstructive lung disease ([place]) 12/30/2017  . Other pruritus 12/27/2017  . Eruption cyst 12/19/2017  . 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 ([place]) 11/28/2017  . Chronic cough 11/11/2017  . Pulmonary hypertension ([place]) 11/11/2017  . Pulmonary arterial hypertension ([place]) 10/07/2017  . Acute ITP ([place]) 01/05/2017  . ESRD (end stage renal disease) ([place]) 12/28/2016  . Encounter for removal of sutures 09/09/2016  . Coagulation defect, unspecified ([place]) 09/01/2016  . Underimmunization status 08/05/2016  . Hemolytic anemia ([place]) 07/26/2016  .  Epistaxis, recurrent 07/26/2016  . Unspecified protein-calorie malnutrition ([place]) 07/13/2016  . Aftercare including intermittent dialysis ([place]) 07/07/2016  . Anemia in chronic kidney disease 07/07/2016  . Hypokalemia 07/07/2016  . Iron deficiency anemia, unspecified 07/07/2016  . 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  Nonrheumatic mitral (valve) prolapse 07/07/2016  . Other irritable bowel syndrome 07/07/2016  . Other secondary thrombocytopenia 07/07/2016  . Secondary hyperparathyroidism of renal origin ([place]) 07/07/2016  . Thrombocytopenia ([place]) 07/01/2016  . ARF (acute renal failure) ([place]) 06/25/2016  . Anxiety 06/25/2016  . Bilateral carpal tunnel syndrome 10/22/2015  . Chronic gout without tophus 10/22/2015  . Chronic nonintractable headache 10/08/2015  . Fibroid uterus 01/03/2012  . H/O vitamin D deficiency 01/03/2012  . Post-menopausal 01/03/2012  . Hereditary and idiopathic peripheral neuropathy 11/19/2011  . Intestinal malabsorption 11/19/2011  . Lichen planus 92/33/0076  . Low back pain 11/19/2011  . Diffuse spasm of esophagus 11/11/2011  . ESR raised 11/11/2011  . Postinflammatory pulmonary fibrosis ([place]) 11/11/2011  . Scleroderma ([place]) 11/16/2010  . Rheumatoid arthritis ([place]) 11/16/2010  . Raynaud's disease 11/16/2010  . Symptomatic menopausal or female climacteric states 11/16/2010    Lanice Shirts PT 04/30/2020, 4:43 PM  [facility] [address] [place], [place], [address] Phone: [phone]   Fax:  [phone]  Name: Katie Nunez MRN: [id number] Date of Birth: 1955/10/28

## 2020-04-30 NOTE — Therapy (Signed)
[facility] [address] [place], [place], [address] Phone: [phone]   Fax:  [phone]  Occupational Therapy Treatment  Patient Details  Name: Katie Nunez MRN: [id number] Date of Birth: 03-08-1955 Referring Provider (OT): Molli Barrows   Encounter Date: 04/30/2020   OT End of Session - 04/30/20 1640    Visit Number 19    Number of Visits 25    Date for OT Re-Evaluation 05/16/20    Authorization Type UHC Medicare    Authorization - Visit Number 19    Authorization - Number of Visits 20    OT Start Time 1412    OT Stop Time 1445    OT Time Calculation (min) 33 min           Past Medical History:  Diagnosis Date  . Achalasia   . Anxiety   . Dysplasia of cervix, low grade (CIN 1)   . Environmental allergies    "all year long" (12/27/2016)  . ESRD (end stage renal disease) on dialysis [facility])    "TTS; [place]" (12/27/2016)  . Fibromyalgia   . GERD (gastroesophageal reflux disease)   . Gout   . Hypertension   . IBS (irritable bowel syndrome)   . MVP (mitral valve prolapse)   . RA (rheumatoid arthritis) (HCC)    FOLLOWED BY DR. SHANAHAN  . Raynaud's disease   . Scleroderma ([place])   . Seasonal allergies   . Thrombocytopenia ([place]) 07/01/2016   Acute fall to 13,000 07/01/16  . Tubular adenoma 01/08/2008   CECUM  . Vitamin D deficiency     Past Surgical History:  Procedure Laterality Date  . ANKLE FRACTURE SURGERY Right   . AV FISTULA PLACEMENT Left 06/28/2016   Procedure: left arm ARTERIOVENOUS (AV) FISTULA CREATION;  Surgeon: Rosetta Posner, MD;  Location: [facility];  Service: Vascular;  Laterality: Left;  . BASCILIC VEIN TRANSPOSITION Left 09/27/2016   Procedure: LEFT UPPER ARM CEPHALIC VEIN TRANSPOSITION;  Surgeon: Rosetta Posner, MD;  Location: [place];  Service: Vascular;  Laterality: Left;  . BREAST BIOPSY     "? side"  . CESAREAN SECTION  1994  . CO2 LASER OF CERVIX    . COLONOSCOPY W/  BIOPSIES  01/08/2008  . INSERTION OF DIALYSIS CATHETER Right 06/28/2016   Procedure: INSERTION OF DIALYSIS CATHETER, right internal jugular;  Surgeon: Rosetta Posner, MD;  Location: [facility];  Service: Vascular;  Laterality: Right;  . MYOMECTOMY    . NASAL ENDOSCOPY WITH EPISTAXIS CONTROL N/A 12/29/2019   Procedure: NASAL ENDOSCOPY WITH EPISTAXIS CONTROL;  Surgeon: Leta Baptist, MD;  Location[facility];  Service: ENT;  Laterality: N/A;  . PELVIC LAPAROSCOPY  2011  . superficial thrombophlebitis Left 07-2014    There were no vitals filed for this visit.   Subjective Assessment - 04/30/20 1638    Subjective  Pt reports foot painafter seeing foot doctor    Pertinent History Scleroderma, RA,HTN, ESRD (dialysis), hx R ankle Fx, 2000, anxiety,fibromyalgia, gout, HTN, MVP, Raynaud's  disease, C section, ostructive lung disease, B CTS, hereditary and periferal neuropathy, venous reflux, ICH    Limitations NO BP LUE due to fistula    Patient Stated Goals write and use RUE better, drive agin    Currently in Pain? Yes    Pain Score 3     Pain Location Foot    Pain Orientation Right    Pain Descriptors / Indicators Aching    Pain Type Acute pain  Pain Frequency Intermittent    Aggravating Factors  standing    Pain Relieving Factors rest                Treatment: Pt cut up and apple with pronged cutting board using RUE, modified indpendently then she cut small pieces using left hand to hold apple. Pt simulated placing a pan in the oven and removing with bilateral UE's safely and Independently. Pt was issued red theraputty HEP. Discussed plans to d/c next visit and that pt could return for additional therapy in the future but that she would need MD order.                 OT Education - 04/30/20 1639    Education Details Functional activites to perfrom at home daily as a part of HEP, upgraded red putty HEP for composite girp and pinch, safety for cutting and use of pronged cutting  board.    Person(s) Educated Patient    Methods Explanation;Demonstration;Handout;Verbal cues    Comprehension Verbalized understanding;Returned demonstration;Verbal cues required            OT Short Term Goals - 04/09/20 1442      OT SHORT TERM GOAL #1   Title I with inital HEP.    Time 4    Period Weeks    Status Achieved      OT SHORT TERM GOAL #2   Title Pt will demonstrate improved fine motor coordination for ADLs as evidenced by decreasing 9 hole peg test score for RUE to 52 secs or less    Baseline RUE 60.56, LUE 32.81    Time 4    Period Weeks    Status Achieved   RUE 43.84s     OT SHORT TERM GOAL #3   Title Pt will perform light home management/simple cooking tasks with supervision and min v.c    Time 4    Period Weeks    Status Achieved      OT SHORT TERM GOAL #4   Title Pt will demonstrate improved RUE functional use as eveidenced by increasing box/ blocks score to 25 blocks with RUE.    Time 4    Period Weeks    Status Achieved   RUE 30 blocks on 03/14/2020     OT SHORT TERM GOAL #5   Title Pt will write a short paragraph with at least 90% legibility.    Time 4    Period Weeks    Status Achieved   100% legibility with increased time            OT Long Term Goals - 04/30/20 1415      OT LONG TERM GOAL #1   Title I with updated HEP.-04/09/20    Status Achieved   met for red putty     OT LONG TERM GOAL #2   Title Pt will perform basic home managment/ cooking modified Independently    Status Partially Met   Pt has performed but she is not performing consistently, pt reports continued challenges.     OT LONG TERM GOAL #3   Title Pt will demonstrate improved fine motor coordination for ADLs as evidenced by decreasing 9 hole peg test score for RUE to 45 secs or less.    Status Achieved      OT LONG TERM GOAL #4   Title Pt will write a short paragraph with 100% legibility.    Status Achieved   with increased time  OT LONG TERM GOAL #5   Title Pt  will demonstrate ability to retrieve a lightweight object at 125 shoulder flexion with RUE demonstrating good control. Revised  goal-Pt will retrieve a light weight object at 115 shoulder flexion with pain less than or equal to 2/10.    Time 5    Period Weeks    Status On-going   105 prior to pain, pt can reach highter but she has significant pain     OT LONG TERM GOAL #6   Title Pt will report cooking a simple meal 2x week modified Indpendently.    Time 5    Period Weeks    Status Not Met   Pt reports sometimes she cooks something 1x week     OT LONG TERM GOAL #7   Title Pt will perform laundry and fold clothes modified independently.    Time 5    Period Weeks    Status Achieved   Pt simulated this task in the clinic, pt was able to perform, she may require rest breaks     OT LONG TERM GOAL #8   Title Pt will report increased ease with removing items form the oven, stirring food and  scrambling an egg with RUE    Time 5    Period Weeks    Status Achieved   Pt demonstrated ability to perform these activities within the clinic     OT LONG TERM GOAL  #9   TITLE Pt will demonstrate improved fine motor coordination for ADLs as evidenced by decreasing 9 hole peg test score to 40 secs or less for RUE    Time 5    Period Weeks    Status On-going                 Plan - 04/30/20 1641    Clinical Impression Statement Pt demonstrates improved functional use of her RUE. Pt was bile to cut up and apple today using pronged cutting board, then she cut smaller pieces using both hands.    OT Occupational Profile and History Detailed Assessment- Review of Records and additional review of physical, cognitive, psychosocial history related to current functional performance    Occupational performance deficits (Please refer to evaluation for details): ADL's;IADL's;Leisure;Rest and Sleep;Social Participation;Other    Body Structure / Function / Physical Skills ADL;Endurance;Decreased knowledge of  precautions;Decreased knowledge of use of DME;Flexibility;IADL;Pain;Skin integrity;Edema;Mobility;ROM    Rehab Potential Good    OT Frequency 2x / week    OT Duration --   5 weeks   OT Treatment/Interventions Self-care/ADL training;Therapeutic exercise;DME and/or AE instruction;Therapist, nutritional;Compression bandaging;Other (comment);Manual Therapy;Scar mobilization;Coping strategies training;Energy conservation;Manual lymph drainage;Passive range of motion;Therapeutic activities    Plan check remaining goals, anticipate d/c next visit    Consulted and Agree with Plan of Care Patient           Patient will benefit from skilled therapeutic intervention in order to improve the following deficits and impairments:   Body Structure / Function / Physical Skills: ADL,Endurance,Decreased knowledge of precautions,Decreased knowledge of use of DME,Flexibility,IADL,Pain,Skin integrity,Edema,Mobility,ROM       Visit Diagnosis: Muscle weakness (generalized)  Other abnormalities of gait and mobility  Other symptoms and signs involving the nervous system  Other lack of coordination  Unsteadiness on feet    Problem List Patient Active Problem List   Diagnosis Date Noted  . Abnormality of gait 02/25/2020  . Protein-calorie malnutrition, severe 01/19/2020  . Epistaxis   . Sleep disturbance   .  Slow transit constipation   . Hemorrhoids   . Chronic systolic congestive heart failure ([place])   . ESRD on dialysis ([place])   . Right hemiparesis ([place])   . Pressure injury of skin 12/20/2019  . Intraparenchymal hemorrhage of brain ([place]) 12/18/2019  . ICH (intracerebral hemorrhage) ([place]) 12/12/2019  . Viral disease 08/22/2019  . Chronic right-sided heart failure ([place]) 07/09/2019  . Supraventricular tachycardia ([place]) 07/09/2019  . Other cirrhosis of liver ([place]) 06/27/2019  . Heart failure ([place]) 02/21/2019  . Heart palpitations 08/14/2018  . Other fluid overload 07/27/2018  . Encounter  for long-term (current) use of other medications 01/18/2018  . Hypothyroidism 01/18/2018  . Myalgia and myositis 01/18/2018  . Chronic nephritis 01/18/2018  . Other interstitial pulmonary diseases with fibrosis in diseases classified elsewhere ([place]) 01/18/2018  . Other long term (current) drug therapy 01/18/2018  . Sleep apnea 01/18/2018  . Unspecified persistent mental disorders due to conditions classified elsewhere 01/18/2018  . Venous reflux 01/18/2018  . Vitamin B12 deficiency 01/18/2018  . Vitamin D deficiency 01/18/2018  . Obstructive lung disease ([place]) 12/30/2017  . Other pruritus 12/27/2017  . Eruption cyst 12/19/2017  . 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 Chronic cough 11/11/2017  . Pulmonary hypertension ([place]) 11/11/2017  . Pulmonary arterial hypertension ([place]) 10/07/2017  . Acute ITP ([place]) 01/05/2017  . ESRD (end stage renal disease) ([place]) 12/28/2016  . Encounter for removal of sutures 09/09/2016  . Coagulation defect, unspecified ([place]) 09/01/2016  . Underimmunization status 08/05/2016  . Hemolytic anemia ([place]) 07/26/2016  . Epistaxis, recurrent 07/26/2016  . Unspecified protein-calorie malnutrition ([place]) 07/13/2016  . Aftercare including intermittent dialysis ([place]) 07/07/2016  . Anemia in chronic kidney disease 07/07/2016  . Hypokalemia 07/07/2016  . Iron deficiency anemia, unspecified 07/07/2016  . 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 Nonrheumatic mitral (valve) prolapse 07/07/2016  . Other irritable bowel syndrome 07/07/2016  . Other secondary thrombocytopenia 07/07/2016  . Secondary hyperparathyroidism of renal origin ([place]) 07/07/2016  . Thrombocytopenia ([place]) 07/01/2016  . ARF (acute renal failure) ([place]) 06/25/2016  . Anxiety 06/25/2016  . Bilateral carpal tunnel syndrome 10/22/2015  . Chronic gout without tophus 10/22/2015  . Chronic nonintractable headache 10/08/2015  . Fibroid uterus 01/03/2012  . H/O  vitamin D deficiency 01/03/2012  . Post-menopausal 01/03/2012  . Hereditary and idiopathic peripheral neuropathy 11/19/2011  . Intestinal malabsorption 11/19/2011  . Lichen planus 27/78/2423  . Low back pain 11/19/2011  . Diffuse spasm of esophagus 11/11/2011  . ESR raised 11/11/2011  . Postinflammatory pulmonary fibrosis ([place]) 11/11/2011  . Scleroderma ([place]) 11/16/2010  . Rheumatoid arthritis ([place]) 11/16/2010  . Raynaud's disease 11/16/2010  . Symptomatic menopausal or female climacteric states 11/16/2010    Giovonni Poirier 04/30/2020, 4:45 PM Theone Murdoch, OTR/L [phone] Phone: [phone] 5:00 PM 04/20/22[facility] [address] [place], [place], [address] Phone: [phone]   Fax:  [phone]  Name: Katie Nunez MRN: [id number] Date of Birth: Jun 07, 1955

## 2020-05-02 ENCOUNTER — Ambulatory Visit: Payer: Medicare Other | Admitting: Occupational Therapy

## 2020-05-05 ENCOUNTER — Encounter: Payer: Medicare Other | Admitting: Physical Medicine & Rehabilitation

## 2020-05-05 ENCOUNTER — Telehealth: Payer: Self-pay | Admitting: Physical Medicine & Rehabilitation

## 2020-05-05 NOTE — Telephone Encounter (Signed)
She does not need to follow up with [place] and can follow up with Neuro.  Thanks.

## 2020-05-05 NOTE — Telephone Encounter (Signed)
Patient is not sure why she is coming here to follow up, States that PT has already released her.  And due to dialysis she can't come on Tu/Thur.  Please advise when/who to follow up with if needed.

## 2020-05-06 NOTE — Telephone Encounter (Signed)
Patient has been notified via VM

## 2020-05-07 ENCOUNTER — Ambulatory Visit: Payer: Medicare Other

## 2020-05-07 ENCOUNTER — Other Ambulatory Visit: Payer: Self-pay

## 2020-05-07 DIAGNOSIS — R278 Other lack of coordination: Secondary | ICD-10-CM | POA: Diagnosis not present

## 2020-05-07 DIAGNOSIS — R2681 Unsteadiness on feet: Secondary | ICD-10-CM

## 2020-05-07 DIAGNOSIS — R2689 Other abnormalities of gait and mobility: Secondary | ICD-10-CM

## 2020-05-07 DIAGNOSIS — M6281 Muscle weakness (generalized): Secondary | ICD-10-CM

## 2020-05-07 NOTE — Patient Instructions (Signed)
Time spent during session discussing strategies to build muscle mass and need to reach out to PCP for advise/recommendations, discussed need to add protein to diet as well as factors in her medical history that are working against her

## 2020-05-07 NOTE — Therapy (Signed)
[facility] [address] [place], [place], [address] Phone: [phone]   Fax:  [phone]  Physical Therapy Treatment  Patient Details  Name: Katie Nunez MRN: [id number] Date of Birth: [birth year] Referring Provider (PT): [name] MD   Encounter Date: 05/07/2020   PT End of Session - 05/07/20 1629    Visit Number 17    Number of Visits 20    Date for PT Re-Evaluation 06/04/20    Authorization Type [employer]    PT Start Time 1450    PT Stop Time 1530    PT Time Calculation (min) 40 min    Equipment Utilized During Treatment Gait belt    Activity Tolerance Patient tolerated treatment well    Behavior During Therapy [facility] for tasks assessed/performed           Past Medical History:  Diagnosis Date  . Achalasia   . Anxiety   . Dysplasia of cervix, low grade (CIN 1)   . Environmental allergies    "all year long" (12/27/2016)  . ESRD (end stage renal disease) on dialysis [facility])    "TTS; [place]" (12/27/2016)  . Fibromyalgia   . GERD (gastroesophageal reflux disease)   . Gout   . Hypertension   . IBS (irritable bowel syndrome)   . MVP (mitral valve prolapse)   . RA (rheumatoid arthritis) (HCC)    FOLLOWED BY DR. SHANAHAN  . Raynaud's disease   . Scleroderma ([place])   . Seasonal allergies   . Thrombocytopenia ([place]) 07/01/2016   Acute fall to 13,000 07/01/16  . Tubular adenoma 01/08/2008   CECUM  . Vitamin D deficiency     Past Surgical History:  Procedure Laterality Date  . ANKLE FRACTURE SURGERY Right   . AV FISTULA PLACEMENT Left 06/28/2016   Procedure: left arm ARTERIOVENOUS (AV) FISTULA CREATION;  Surgeon: Rosetta Posner, MD;  Location: [place];  Service: Vascular;  Laterality: Left;  . BASCILIC VEIN TRANSPOSITION Left 09/27/2016   Procedure: LEFT UPPER ARM CEPHALIC VEIN TRANSPOSITION;  Surgeon: Rosetta Posner, MD;  Location: [facility];  Service: Vascular;  Laterality: Left;  . BREAST BIOPSY     "?  side"  . CESAREAN SECTION  1994  . CO2 LASER OF CERVIX    . COLONOSCOPY W/ BIOPSIES  01/08/2008  . INSERTION OF DIALYSIS CATHETER Right 06/28/2016   Procedure: INSERTION OF DIALYSIS CATHETER, right internal jugular;  Surgeon: Rosetta Posner, MD;  Location: [facility];  Service: Vascular;  Laterality: Right;  . MYOMECTOMY    . NASAL ENDOSCOPY WITH EPISTAXIS CONTROL N/A 12/29/2019   Procedure: NASAL ENDOSCOPY WITH EPISTAXIS CONTROL;  Surgeon: Leta Baptist, MD;  Location: [facility] OR;  Service: ENT;  Laterality: N/A;  . PELVIC LAPAROSCOPY  2011  . superficial thrombophlebitis Left 07-2014    There were no vitals filed for this visit.   Subjective Assessment - 05/07/20 1625    Subjective Still concerned about general lack or energy and RLE getting tired and having to "swing leg around", no falls or LOB noted    Patient is accompained by: Family member    Pertinent History Scleroderma, RA, pulmonary HTN, kidney disease (dialysis) Tu,TH and Sat, Rt ankle fx/dislocation/surgery 2000    Limitations Walking    How long can you sit comfortably? unlimited    How long can you stand comfortably? unlimited    How long can you walk comfortably? unlimited    Patient Stated Goals strengthen ankle and knee R,  get off of cane    Pain Onset More than a month ago                             [facility] Adult PT Treatment/Exercise - 05/07/20 0001      Ambulation/Gait   Ambulation/Gait Yes    Ambulation/Gait Assistance 5: Supervision    Ambulation/Gait Assistance Details no foot brace worn for session    Ambulation Distance (Feet) 230 Feet    Assistive device Straight cane    Gait Pattern Decreased arm swing - right    Ambulation Surface Level;Indoor    Gait Comments cued to bring r knee through swing phase to facilitate DF               Balance Exercises - 05/07/20 0001      Balance Exercises: Standing   Stepping Strategy Anterior;Foam/compliant surface;10 reps;Limitations    Stepping  Strategy Limitations performed runners step from [employer], 10x per LE    Step Ups Forward;Lateral;UE support 1;Limitations    Step Ups Limitations used [employer] pad as step    Tandem Gait Forward;Retro;Upper extremity support;4 reps;5 reps;Limitations    Tandem Gait Limitations performed in // bars               PT Short Term Goals - 04/30/20 1453      PT SHORT TERM GOAL #1   Title Ind with initial HEP and able to demo back to PT w/o need of VCs    Baseline no HEP; 03/12/20 demos HEP w/o need of VCs    Time 2    Period Weeks    Status Achieved    Target Date 02/27/20      PT SHORT TERM GOAL #2   Title Patient to obtain [facility] and bring it to PT sessions for adjustment and training    Baseline not using cane prior; 03/12/20 Has been using cane in clinic, adjusted to her body habitus    Time 2    Period Weeks    Status Achieved    Target Date 02/27/20             PT Long Term Goals - 04/30/20 1453      PT LONG TERM GOAL #1   Title Patient to improve 5x STS score to < 20 sec    Baseline 24 seconds; NT; 5x STS score 16.50s    Time 8    Period Weeks    Status Achieved      PT LONG TERM GOAL #2   Title Patient to demo 4+/5 R hip/knee strength throughout    Baseline 4/5 R ankle knee strength; 03/26/20 No changes; 04/30/20 MMT 4+/5 strength in RLE hip/knee with B abd 4/5    Time 8    Period Weeks    Status On-going      PT LONG TERM GOAL #3   Title Patient to decerease TUG time to ,13.5 sec w/o AD    Baseline 18 sec TUG average of 2 trials; NT; 04/30/20 TUG time 15.36s    Time 8    Period Weeks    Status On-going      PT LONG TERM GOAL #4   Title Ambulate 1069f with LRAD across outdoor surfaces under distant S    Baseline limited to in home ambulation; Ambulation limitd to 2049foutside with [facility] S; 04/30/20 ambulation distance limited to 50065fue to fatigue    Time 8  Period Weeks    Status On-going      PT LONG TERM GOAL #5   Title Return to driving as appropriate     Baseline currently not driving; 0/35/59 no change;  04/29/20 Has returned to short distance driving    Time 8    Period Weeks    Status Partially Met                 Plan - 05/07/20 1629    Clinical Impression Statement Todays rehab session focused on RLE strengthening w/o R footdrop brace to assess function, patient able to avoid toe scuff during ambulation but a decrease in R hip flexion remains with patient able to self correct when cued.  Patient ended to land with R foot flat today vs. heel toe pattern.  Continued need of B abduction strengthening.  Time spent during session discussing strategies to build muscle mass and need to reach out to PCP for advise/recommendations    Personal Factors and Comorbidities Age;Comorbidity 2    Comorbidities ESRD(HD T, Th, Sat), R ankle fx 2000    Examination-Participation Restrictions Community Activity    Stability/Clinical Decision Making Stable/Uncomplicated    Rehab Potential Good    PT Frequency 2x / week    PT Duration 4 weeks    PT Treatment/Interventions ADLs/Self Care Home Management;Cryotherapy;Electrical Stimulation;Ultrasound;Therapeutic exercise;Balance training;Patient/family education;Manual techniques;Dry needling;Therapeutic activities;Neuromuscular re-education;DME Instruction;Gait training;Stair training;Functional mobility training;Orthotic Fit/Training    PT Next Visit Plan focus on improving gait cadence and B hip abduction strength as well as reinforcing heel strike    PT Home Exercise Plan heel/toe raises, sidestepping against green band, retrowalikng    Consulted and Agree with Plan of Care Patient           Patient will benefit from skilled therapeutic intervention in order to improve the following deficits and impairments:  Postural dysfunction,Decreased balance,Impaired flexibility,Abnormal gait,Decreased mobility,Difficulty walking,Decreased coordination  Visit Diagnosis: Unsteadiness on feet  Muscle  weakness (generalized)  Other abnormalities of gait and mobility     Problem List Patient Active Problem List   Diagnosis Date Noted  . Abnormality of gait 02/25/2020  . Protein-calorie malnutrition, severe 01/19/2020  . Epistaxis   . Sleep disturbance   . Slow transit constipation   . Hemorrhoids   . Chronic systolic congestive heart failure ([place])   . ESRD on dialysis ([place])   . Right hemiparesis ([place])   . Pressure injury of skin 12/20/2019  . Intraparenchymal hemorrhage of brain ([place]) 12/18/2019  . ICH (intracerebral hemorrhage) ([place]) 12/12/2019  . Viral disease 08/22/2019  . Chronic right-sided heart failure ([place]) 07/09/2019  . Supraventricular tachycardia ([place]) 07/09/2019  . Other cirrhosis of liver ([place]) 06/27/2019  . Heart failure ([place]) 02/21/2019  . Heart palpitations 08/14/2018  . Other fluid overload 07/27/2018  . Encounter for long-term (current) use of other medications 01/18/2018  . Hypothyroidism 01/18/2018  . Myalgia and myositis 01/18/2018  . Chronic nephritis 01/18/2018  . Other interstitial pulmonary diseases with fibrosis in diseases classified elsewhere ([place]) 01/18/2018  . Other long term (current) drug therapy 01/18/2018  . Sleep apnea 01/18/2018  . Unspecified persistent mental disorders due to conditions classified elsewhere 01/18/2018  . Venous reflux 01/18/2018  . Vitamin B12 deficiency 01/18/2018  . Vitamin D deficiency 01/18/2018  . Obstructive lung disease ([place]) 12/30/2017  . Other pruritus 12/27/2017  . Eruption cyst 12/19/2017  . 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 ([place]) 11/28/2017  . Chronic cough 11/11/2017  . Pulmonary hypertension (  [place]) 11/11/2017  . Pulmonary arterial hypertension ([place]) 10/07/2017  . Acute ITP ([place]) 01/05/2017  . ESRD (end stage renal disease) ([place]) 12/28/2016  . Encounter for removal of sutures 09/09/2016  . Coagulation defect, unspecified ([place]) 09/01/2016  . Underimmunization  status 08/05/2016  . Hemolytic anemia ([place]) 07/26/2016  . Epistaxis, recurrent 07/26/2016  . Unspecified protein-calorie malnutrition ([place]) 07/13/2016  . Aftercare including intermittent dialysis ([place]) 07/07/2016  . Anemia in chronic kidney disease 07/07/2016  . Hypokalemia 07/07/2016  . Iron deficiency anemia, unspecified 07/07/2016  . 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 Nonrheumatic mitral (valve) prolapse 07/07/2016  . Other irritable bowel syndrome 07/07/2016  . Other secondary thrombocytopenia 07/07/2016  . Secondary hyperparathyroidism of renal origin ([place]) 07/07/2016  . Thrombocytopenia ([place]) 07/01/2016  . ARF (acute renal failure) ([place]) 06/25/2016  . Anxiety 06/25/2016  . Bilateral carpal tunnel syndrome 10/22/2015  . Chronic gout without tophus 10/22/2015  . Chronic nonintractable headache 10/08/2015  . Fibroid uterus 01/03/2012  . H/O vitamin D deficiency 01/03/2012  . Post-menopausal 01/03/2012  . Hereditary and idiopathic peripheral neuropathy 11/19/2011  . Intestinal malabsorption 11/19/2011  . Lichen planus 99/78/7765  . Low back pain 11/19/2011  . Diffuse spasm of esophagus 11/11/2011  . ESR raised 11/11/2011  . Postinflammatory pulmonary fibrosis ([place]) 11/11/2011  . Scleroderma ([place]) 11/16/2010  . Rheumatoid arthritis ([place]) 11/16/2010  . Raynaud's disease 11/16/2010  . Symptomatic menopausal or female climacteric states 11/16/2010    Lanice Shirts PT 05/07/2020, 4:45 PM  [facility] [address] [place], [place], [address] Phone: [phone]   Fax:  [phone]  Name: Katie Nunez MRN: [id number] Date of Birth: [birth year]

## 2020-05-07 NOTE — Progress Notes (Signed)
Chief Complaint  Patient presents with  . Foot Pain     bilateral foot/toe pain     HPI: Patient is 65 y.o. female who presents today for continued foot and nail care. She relates she has pain in her toes and that it continues to improve. She has a callus on the left great toe that is relieved by periodic debridement.  She is diabetic and her A1C is 5.2.  She denies any new injuries or pedal complaints.    Allergies  Allergen Reactions  . Other Anaphylaxis and Other (See Comments)    Do not use polyflux membrane.  Use alternate  . Savella [Milnacipran Hcl] Palpitations and Other (See Comments)    Unknown  . Tape Rash and Other (See Comments)    Itch- unsure if it was paper or adhesive tape    Review of systems is reviewed and negative.   Physical Exam  Patient is awake, alert, and oriented x 3.  In no acute distress.     Vascular status is intact with palpable pedal pulses DP and non palpable  PT bilateral and capillary refill time less than 3 seconds bilateral.  mild edema noted.  No redness or swelling along the nail beds seen today.   Neurological exam reveals epicritic and protective sensation grossly intact bilateral.    Dermatological exam reveals skin is supple and dry to bilateral feet.  No open lesions present.  Callus tissue is present on the left hallux lateral side..  Right hallux nail appears to be growing out nicely from the ap nail procedure.  Bilateral hallux nails are elongated, brittle, discolored and painful with pressure.      Musculoskeletal exam: Musculature intact with dorsiflexion, plantarflexion, inversion, eversion. First MPJ joint range of motion mildly decreased.  Bunion deformity is present left.    Assessment:   ICD-10-CM   1. Pain due to onychomycosis of toenail  B35.1    M79.676   2. Corns and callosities  L84   3. Pain in toes of both feet  M79.674    M79.675      Plan: I carefully debrided bilateral hallux nails and  pared all callus  tissue today with a 15 blade.  I recommended soft accomidative shoe gear and padding to the area.  She will be seen back for periodic debridement as this seems to relieve her discomfort.   If any concerns arise she will call.

## 2020-05-09 ENCOUNTER — Ambulatory Visit: Payer: Medicare Other | Admitting: Occupational Therapy

## 2020-05-12 ENCOUNTER — Telehealth: Payer: Self-pay | Admitting: Adult Health

## 2020-05-12 NOTE — Telephone Encounter (Signed)
Pt called, would like to schedule with a physician. Just concerned I have not scheduled with a physician. Would like a call from the nurse.

## 2020-05-13 NOTE — Telephone Encounter (Signed)
Returned patient's call and her concern is she has not been seen by Dr. Leonie Man in either visit.  Patient insisted on see Dr. Leonie Man in July regardless of Jessica's recommendation and did not want to cancel her appointment in May to see [name].    Patient denied further questions, verbalized understanding and expressed appreciation for the phone call.

## 2020-05-14 ENCOUNTER — Other Ambulatory Visit: Payer: Self-pay

## 2020-05-14 ENCOUNTER — Ambulatory Visit: Payer: Medicare Other | Attending: Family Medicine

## 2020-05-14 DIAGNOSIS — R278 Other lack of coordination: Secondary | ICD-10-CM | POA: Insufficient documentation

## 2020-05-14 DIAGNOSIS — R29818 Other symptoms and signs involving the nervous system: Secondary | ICD-10-CM | POA: Insufficient documentation

## 2020-05-14 DIAGNOSIS — R2689 Other abnormalities of gait and mobility: Secondary | ICD-10-CM | POA: Insufficient documentation

## 2020-05-14 DIAGNOSIS — M6281 Muscle weakness (generalized): Secondary | ICD-10-CM | POA: Insufficient documentation

## 2020-05-14 DIAGNOSIS — R2681 Unsteadiness on feet: Secondary | ICD-10-CM | POA: Diagnosis present

## 2020-05-14 NOTE — Therapy (Signed)
[facility] [address] [place], [place], [address] Phone: [phone]   Fax:  [phone]  Physical Therapy Treatment  Patient Details  Name: Katie Nunez MRN: [id number] Date of Birth: 06/03/1955 Referring Provider (PT): [name] MD   Encounter Date: 05/14/2020   PT End of Session - 05/14/20 1830    Visit Number 18    Number of Visits 20    Date for PT Re-Evaluation 06/04/20    Authorization Type UHC-MC    PT Start Time 1230    PT Stop Time 1315    PT Time Calculation (min) 45 min    Equipment Utilized During Treatment Gait belt    Activity Tolerance Patient tolerated treatment well    Behavior During Therapy [facility] for tasks assessed/performed           Past Medical History:  Diagnosis Date  . Achalasia   . Anxiety   . Dysplasia of cervix, low grade (CIN 1)   . Environmental allergies    "all year long" (12/27/2016)  . ESRD (end stage renal disease) on dialysis [facility])    "TTS; [place]" (12/27/2016)  . Fibromyalgia   . GERD (gastroesophageal reflux disease)   . Gout   . Hypertension   . IBS (irritable bowel syndrome)   . MVP (mitral valve prolapse)   . RA (rheumatoid arthritis) (HCC)    FOLLOWED BY DR. SHANAHAN  . Raynaud's disease   . Scleroderma ([place])   . Seasonal allergies   . Thrombocytopenia ([place]) 07/01/2016   Acute fall to 13,000 07/01/16  . Tubular adenoma 01/08/2008   CECUM  . Vitamin D deficiency     Past Surgical History:  Procedure Laterality Date  . ANKLE FRACTURE SURGERY Right   . AV FISTULA PLACEMENT Left 06/28/2016   Procedure: left arm ARTERIOVENOUS (AV) FISTULA CREATION;  Surgeon: Rosetta Posner, MD;  Location: [facility];  Service: Vascular;  Laterality: Left;  . BASCILIC VEIN TRANSPOSITION Left 09/27/2016   Procedure: LEFT UPPER ARM CEPHALIC VEIN TRANSPOSITION;  Surgeon: Rosetta Posner, MD;  Location: [facility];  Service: Vascular;  Laterality: Left;  . BREAST BIOPSY     "?  side"  . CESAREAN SECTION  1994  . CO2 LASER OF CERVIX    . COLONOSCOPY W/ BIOPSIES  01/08/2008  . INSERTION OF DIALYSIS CATHETER Right 06/28/2016   Procedure: INSERTION OF DIALYSIS CATHETER, right internal jugular;  Surgeon: Rosetta Posner, MD;  Location: [facility];  Service: Vascular;  Laterality: Right;  . MYOMECTOMY    . NASAL ENDOSCOPY WITH EPISTAXIS CONTROL N/A 12/29/2019   Procedure: NASAL ENDOSCOPY WITH EPISTAXIS CONTROL;  Surgeon: Leta Baptist, MD;  Location: [facility] OR;  Service: ENT;  Laterality: N/A;  . PELVIC LAPAROSCOPY  2011  . superficial thrombophlebitis Left 07-2014    There were no vitals filed for this visit.   Subjective Assessment - 05/14/20 1830    Subjective No falls or change in status to note    Patient is accompained by: Family member    Pertinent History Scleroderma, RA, pulmonary HTN, kidney disease (dialysis) Tu,TH and Sat, Rt ankle fx/dislocation/surgery 2000    Limitations Walking    How long can you sit comfortably? unlimited    How long can you stand comfortably? unlimited    How long can you walk comfortably? unlimited    Patient Stated Goals strengthen ankle and knee R, get off of cane    Pain Onset More than a month ago  [facility] Adult PT Treatment/Exercise - 05/14/20 0001      Transfers   Transfers Sit to Stand    Comments 2x10 from [employer] enphasizing eccentric sitting w/o UE support      Lumbar Exercises: Supine   Ab Set 10 reps;Limitations    AB Set Limitations 10 avoiding neck strain    Pelvic Tilt 10 reps;Limitations    Pelvic Tilt Limitations 2x10 to avoid neck strain    Other Supine Lumbar Exercises marching 2x15 1#, alt.      Knee/Hip Exercises: Supine   Bridges with Diona Foley Squeeze Strengthening;Both;Limitations;2 sets;15 reps    Other Supine Knee/Hip Exercises sidelie clamshell with 1# 2x15                    PT Short Term Goals - 04/30/20 1453      PT SHORT TERM GOAL #1   Title Ind with  initial HEP and able to demo back to PT w/o need of VCs    Baseline no HEP; 03/12/20 demos HEP w/o need of VCs    Time 2    Period Weeks    Status Achieved    Target Date 02/27/20      PT SHORT TERM GOAL #2   Title Patient to obtain [facility] and bring it to PT sessions for adjustment and training    Baseline not using cane prior; 03/12/20 Has been using cane in clinic, adjusted to her body habitus    Time 2    Period Weeks    Status Achieved    Target Date 02/27/20             PT Long Term Goals - 04/30/20 1453      PT LONG TERM GOAL #1   Title Patient to improve 5x STS score to < 20 sec    Baseline 24 seconds; NT; 5x STS score 16.50s    Time 8    Period Weeks    Status Achieved      PT LONG TERM GOAL #2   Title Patient to demo 4+/5 R hip/knee strength throughout    Baseline 4/5 R ankle knee strength; 03/26/20 No changes; 04/30/20 MMT 4+/5 strength in RLE hip/knee with B abd 4/5    Time 8    Period Weeks    Status On-going      PT LONG TERM GOAL #3   Title Patient to decerease TUG time to ,13.5 sec w/o AD    Baseline 18 sec TUG average of 2 trials; NT; 04/30/20 TUG time 15.36s    Time 8    Period Weeks    Status On-going      PT LONG TERM GOAL #4   Title Ambulate 1085f with LRAD across outdoor surfaces under distant S    Baseline limited to in home ambulation; Ambulation limitd to 2052foutside [facility]; 04/30/20 ambulation distance limited to 50052fue to fatigue    Time 8    Period Weeks    Status On-going      PT LONG TERM GOAL #5   Title Return to driving as appropriate    Baseline currently not driving; 03/24/95/67 change;  04/29/20 Has returned to short distance driving    Time 8    Period Weeks    Status Partially Met                 Plan - 05/14/20 1831    Clinical Impression Statement Focus of todays skilled session  was establishing a HEP for core strengthening as well as B hip strength and stability, HEP addended with strength deficits noted in  core musculature and R side of trunk.    Personal Factors and Comorbidities Age;Comorbidity 2    Comorbidities ESRD(HD T, Th, Sat), R ankle fx 2000    Examination-Participation Restrictions Community Activity    Stability/Clinical Decision Making Stable/Uncomplicated    Rehab Potential Good    PT Frequency 2x / week    PT Duration 4 weeks    PT Treatment/Interventions ADLs/Self Care Home Management;Cryotherapy;Electrical Stimulation;Ultrasound;Therapeutic exercise;Balance training;Patient/family education;Manual techniques;Dry needling;Therapeutic activities;Neuromuscular re-education;DME Instruction;Gait training;Stair training;Functional mobility training;Orthotic Fit/Training    PT Next Visit Plan focus on improving gait cadence and B hip abduction strength and stabiity as well as reinforcing heel strike with gait    Consulted and Agree with Plan of Care Patient           Patient will benefit from skilled therapeutic intervention in order to improve the following deficits and impairments:  Postural dysfunction,Decreased balance,Impaired flexibility,Abnormal gait,Decreased mobility,Difficulty walking,Decreased coordination  Visit Diagnosis: Unsteadiness on feet  Muscle weakness (generalized)  Other abnormalities of gait and mobility     Problem List Patient Active Problem List   Diagnosis Date Noted  . Abnormality of gait 02/25/2020  . Protein-calorie malnutrition, severe 01/19/2020  . Epistaxis   . Sleep disturbance   . Slow transit constipation   . Hemorrhoids   . Chronic systolic congestive heart failure ([place])   . ESRD on dialysis ([place])   . Right hemiparesis ([place])   . Pressure injury of skin 12/20/2019  . Intraparenchymal hemorrhage of brain ([place]) 12/18/2019  . ICH (intracerebral hemorrhage) ([place]) 12/12/2019  . Viral disease 08/22/2019  . Chronic right-sided heart failure ([place]) 07/09/2019  . Supraventricular tachycardia ([place]) 07/09/2019  . Other cirrhosis of liver  ([place]) 06/27/2019  . Heart failure ([place]) 02/21/2019  . Heart palpitations 08/14/2018  . Other fluid overload 07/27/2018  . Encounter for long-term (current) use of other medications 01/18/2018  . Hypothyroidism 01/18/2018  . Myalgia and myositis 01/18/2018  . Chronic nephritis 01/18/2018  . Other interstitial pulmonary diseases with fibrosis in diseases classified elsewhere ([place]) 01/18/2018  . Other long term (current) drug therapy 01/18/2018  . Sleep apnea 01/18/2018  . Unspecified persistent mental disorders due to conditions classified elsewhere 01/18/2018  . Venous reflux 01/18/2018  . Vitamin B12 deficiency 01/18/2018  . Vitamin D deficiency 01/18/2018  . Obstructive lung disease ([place]) 12/30/2017  . Other pruritus 12/27/2017  . Eruption cyst 12/19/2017  . 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 Chronic cough 11/11/2017  . Pulmonary hypertension ([place]) 11/11/2017  . Pulmonary arterial hypertension ([place]) 10/07/2017  . Acute ITP ([place]) 01/05/2017  . ESRD (end stage renal disease) ([place]) 12/28/2016  . Encounter for removal of sutures 09/09/2016  . Coagulation defect, unspecified ([place]) 09/01/2016  . Underimmunization status 08/05/2016  . Hemolytic anemia ([place]) 07/26/2016  . Epistaxis, recurrent 07/26/2016  . Unspecified protein-calorie malnutrition ([place]) 07/13/2016  . Aftercare including intermittent dialysis ([place]) 07/07/2016  . Anemia in chronic kidney disease 07/07/2016  . Hypokalemia 07/07/2016  . Iron deficiency anemia, unspecified 07/07/2016  . 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 Nonrheumatic mitral (valve) prolapse 07/07/2016  . Other irritable bowel syndrome 07/07/2016  . Other secondary thrombocytopenia 07/07/2016  . Secondary hyperparathyroidism of renal origin ([place]) 07/07/2016  . Thrombocytopenia ([place]) 07/01/2016  . ARF (acute renal failure) ([place]) 06/25/2016  . Anxiety 06/25/2016  . Bilateral carpal tunnel  syndrome  10/22/2015  . Chronic gout without tophus 10/22/2015  . Chronic nonintractable headache 10/08/2015  . Fibroid uterus 01/03/2012  . H/O vitamin D deficiency 01/03/2012  . Post-menopausal 01/03/2012  . Hereditary and idiopathic peripheral neuropathy 11/19/2011  . Intestinal malabsorption 11/19/2011  . Lichen planus 47/09/2955  . Low back pain 11/19/2011  . Diffuse spasm of esophagus 11/11/2011  . ESR raised 11/11/2011  . Postinflammatory pulmonary fibrosis ([place]) 11/11/2011  . Scleroderma ([place]) 11/16/2010  . Rheumatoid arthritis ([place]) 11/16/2010  . Raynaud's disease 11/16/2010  . Symptomatic menopausal or female climacteric states 11/16/2010    Lanice Shirts PT 05/14/2020, 6:37 PM  [facility] [address] [place], [place], [address] Phone: [phone]   Fax:  [phone]  Name: RAYLYN CARTON MRN: [id number] Date of Birth: 02-28-1955

## 2020-05-21 ENCOUNTER — Ambulatory Visit: Payer: Medicare Other | Admitting: Occupational Therapy

## 2020-05-21 ENCOUNTER — Encounter: Payer: Self-pay | Admitting: Occupational Therapy

## 2020-05-21 ENCOUNTER — Ambulatory Visit: Payer: Medicare Other

## 2020-05-21 ENCOUNTER — Other Ambulatory Visit: Payer: Self-pay

## 2020-05-21 DIAGNOSIS — R2681 Unsteadiness on feet: Secondary | ICD-10-CM | POA: Diagnosis not present

## 2020-05-21 DIAGNOSIS — R2689 Other abnormalities of gait and mobility: Secondary | ICD-10-CM

## 2020-05-21 DIAGNOSIS — M6281 Muscle weakness (generalized): Secondary | ICD-10-CM

## 2020-05-21 DIAGNOSIS — R29818 Other symptoms and signs involving the nervous system: Secondary | ICD-10-CM

## 2020-05-21 DIAGNOSIS — R278 Other lack of coordination: Secondary | ICD-10-CM

## 2020-05-21 NOTE — Therapy (Signed)
[facility] [address] [place], [place], [address] Phone: [phone]   Fax:  [phone]  Physical Therapy Treatment  Patient Details  Name: Katie Nunez MRN: [id number] Date of Birth: April 09, 1955 Referring Provider (PT): [name] MD   Encounter Date: 05/21/2020   PT End of Session - 05/21/20 1323    Visit Number 19    Number of Visits 20    Date for PT Re-Evaluation 06/04/20    Authorization Type UHC-MC    PT Start Time 1315    PT Stop Time 1400    PT Time Calculation (min) 45 min    Equipment Utilized During Treatment Gait belt    Activity Tolerance Patient tolerated treatment well    Behavior During Therapy [facility] for tasks assessed/performed           Past Medical History:  Diagnosis Date  . Achalasia   . Anxiety   . Dysplasia of cervix, low grade (CIN 1)   . Environmental allergies    "all year long" (12/27/2016)  . ESRD (end stage renal disease) on dialysis [facility])    "TTS; [place]" (12/27/2016)  . Fibromyalgia   . GERD (gastroesophageal reflux disease)   . Gout   . Hypertension   . IBS (irritable bowel syndrome)   . MVP (mitral valve prolapse)   . RA (rheumatoid arthritis) (HCC)    FOLLOWED BY DR. SHANAHAN  . Raynaud's disease   . Scleroderma ([place])   . Seasonal allergies   . Thrombocytopenia ([place]) 07/01/2016   Acute fall to 13,000 07/01/16  . Tubular adenoma 01/08/2008   CECUM  . Vitamin D deficiency     Past Surgical History:  Procedure Laterality Date  . ANKLE FRACTURE SURGERY Right   . AV FISTULA PLACEMENT Left 06/28/2016   Procedure: left arm ARTERIOVENOUS (AV) FISTULA CREATION;  Surgeon: Rosetta Posner, MD;  Location: [facility];  Service: Vascular;  Laterality: Left;  . BASCILIC VEIN TRANSPOSITION Left 09/27/2016   Procedure: LEFT UPPER ARM CEPHALIC VEIN TRANSPOSITION;  Surgeon: Rosetta Posner, MD;  Location: [facility];  Service: Vascular;  Laterality: Left;  . BREAST BIOPSY     "?  side"  . CESAREAN SECTION  1994  . CO2 LASER OF CERVIX    . COLONOSCOPY W/ BIOPSIES  01/08/2008  . INSERTION OF DIALYSIS CATHETER Right 06/28/2016   Procedure: INSERTION OF DIALYSIS CATHETER, right internal jugular;  Surgeon: Rosetta Posner, MD;  Location: [facility];  Service: Vascular;  Laterality: Right;  . MYOMECTOMY    . NASAL ENDOSCOPY WITH EPISTAXIS CONTROL N/A 12/29/2019   Procedure: NASAL ENDOSCOPY WITH EPISTAXIS CONTROL;  Surgeon: Leta Baptist, MD;  Location: [facility] OR;  Service: ENT;  Laterality: N/A;  . PELVIC LAPAROSCOPY  2011  . superficial thrombophlebitis Left 07-2014    There were no vitals filed for this visit.   Subjective Assessment - 05/21/20 1534    Subjective No falls or medical changes to note, continues with sensation of RLE feeling "heavy"    Patient is accompained by: Family member    Pertinent History Scleroderma, RA, pulmonary HTN, kidney disease (dialysis) Tu,TH and Sat, Rt ankle fx/dislocation/surgery 2000    Limitations Walking    How long can you sit comfortably? unlimited    How long can you stand comfortably? unlimited    How long can you walk comfortably? unlimited    Patient Stated Goals strengthen ankle and knee R, get off of cane    Pain  Onset More than a month ago                             [facility] Adult PT Treatment/Exercise - 05/21/20 0001      Transfers   Transfers Sit to Stand      Ambulation/Gait   Ambulation/Gait Yes    Ambulation/Gait Assistance 5: Supervision    Ambulation/Gait Assistance Details no AFO    Ambulation Distance (Feet) 115 Feet    Assistive device Straight cane    Gait Pattern Decreased arm swing - right;Step-through pattern      Lumbar Exercises: Supine   Ab Set 10 reps;Limitations    AB Set Limitations cul ups 2x10    Pelvic Tilt 10 reps;Limitations    Pelvic Tilt Limitations 2x10    Other Supine Lumbar Exercises marching 2x15      Knee/Hip Exercises: Supine   Bridges with Ball Squeeze  Strengthening;Both;2 sets;15 reps    Other Supine Knee/Hip Exercises sidelie clamshell with 1# 2x15                    PT Short Term Goals - 04/30/20 1453      PT SHORT TERM GOAL #1   Title Ind with initial HEP and able to demo back to PT w/o need of VCs    Baseline no HEP; 03/12/20 demos HEP w/o need of VCs    Time 2    Period Weeks    Status Achieved    Target Date 02/27/20      PT SHORT TERM GOAL #2   Title Patient to obtain [facility] and bring it to PT sessions for adjustment and training    Baseline not using cane prior; 03/12/20 Has been using cane in clinic, adjusted to her body habitus    Time 2    Period Weeks    Status Achieved    Target Date 02/27/20             PT Long Term Goals - 04/30/20 1453      PT LONG TERM GOAL #1   Title Patient to improve 5x STS score to < 20 sec    Baseline 24 seconds; NT; 5x STS score 16.50s    Time 8    Period Weeks    Status Achieved      PT LONG TERM GOAL #2   Title Patient to demo 4+/5 R hip/knee strength throughout    Baseline 4/5 R ankle knee strength; 03/26/20 No changes; 04/30/20 MMT 4+/5 strength in RLE hip/knee with B abd 4/5    Time 8    Period Weeks    Status On-going      PT LONG TERM GOAL #3   Title Patient to decerease TUG time to ,13.5 sec w/o AD    Baseline 18 sec TUG average of 2 trials; NT; 04/30/20 TUG time 15.36s    Time 8    Period Weeks    Status On-going      PT LONG TERM GOAL #4   Title Ambulate 1081f with LRAD across outdoor surfaces under distant S    Baseline limited to in home ambulation; Ambulation limitd to 2068foutside [facility]; 04/30/20 ambulation distance limited to 50045fue to fatigue    Time 8    Period Weeks    Status On-going      PT LONG TERM GOAL #5   Title Return to driving as appropriate  Baseline currently not driving; 1/95/09 no change;  04/29/20 Has returned to short distance driving    Time 8    Period Weeks    Status Partially Met                 Plan  - 05/21/20 1356    Clinical Impression Statement Todays skilled session focused on review of HEP i anticipation of DC, treatment performed w/o ankle brace, gait shows continued lack of knee and hip flexion but not toe scuff, continued signs of mild R hip instability during swing through and some circumduction observed.  Patient appers to have met rehab potential at this time in her recovery and is readyfor home management    Personal Factors and Comorbidities Age;Comorbidity 2    Comorbidities ESRD(HD T, Th, Sat), R ankle fx 2000    Examination-Participation Restrictions Community Activity    Stability/Clinical Decision Making Stable/Uncomplicated    Rehab Potential Good    PT Frequency 2x / week    PT Duration 4 weeks    PT Treatment/Interventions ADLs/Self Care Home Management;Cryotherapy;Electrical Stimulation;Ultrasound;Therapeutic exercise;Balance training;Patient/family education;Manual techniques;Dry needling;Therapeutic activities;Neuromuscular re-education;DME Instruction;Gait training;Stair training;Functional mobility training;Orthotic Fit/Training    PT Next Visit Plan Assess goals for DC    PT Home Exercise Plan [email or web address]    Consulted and Agree with Plan of Care Patient           Patient will benefit from skilled therapeutic intervention in order to improve the following deficits and impairments:  Postural dysfunction,Decreased balance,Impaired flexibility,Abnormal gait,Decreased mobility,Difficulty walking,Decreased coordination  Visit Diagnosis: Unsteadiness on feet  Muscle weakness (generalized)  Other abnormalities of gait and mobility     Problem List Patient Active Problem List   Diagnosis Date Noted  . Abnormality of gait 02/25/2020  . Protein-calorie malnutrition, severe 01/19/2020  . Epistaxis   . Sleep disturbance   . Slow transit constipation   . Hemorrhoids   . Chronic systolic congestive heart failure ([place])   . ESRD on dialysis ([place])   . Right  hemiparesis ([place])   . Pressure injury of skin 12/20/2019  . Intraparenchymal hemorrhage of brain ([place]) 12/18/2019  . ICH (intracerebral hemorrhage) ([place]) 12/12/2019  . Viral disease 08/22/2019  . Chronic right-sided heart failure ([place]) 07/09/2019  . Supraventricular tachycardia ([place]) 07/09/2019  . Other cirrhosis of liver ([place]) 06/27/2019  . Heart failure ([place]) 02/21/2019  . Heart palpitations 08/14/2018  . Other fluid overload 07/27/2018  . Encounter for long-term (current) use of other medications 01/18/2018  . Hypothyroidism 01/18/2018  . Myalgia and myositis 01/18/2018  . Chronic nephritis 01/18/2018  . Other interstitial pulmonary diseases with fibrosis in diseases classified elsewhere ([place]) 01/18/2018  . Other long term (current) drug therapy 01/18/2018  . Sleep apnea 01/18/2018  . Unspecified persistent mental disorders due to conditions classified elsewhere 01/18/2018  . Venous reflux 01/18/2018  . Vitamin B12 deficiency 01/18/2018  . Vitamin D deficiency 01/18/2018  . Obstructive lung disease ([place]) 12/30/2017  . Other pruritus 12/27/2017  . Eruption cyst 12/19/2017  . 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 Chronic cough 11/11/2017  . Pulmonary hypertension ([place]) 11/11/2017  . Pulmonary arterial hypertension ([place]) 10/07/2017  . Acute ITP ([place]) 01/05/2017  . ESRD (end stage renal disease) ([place]) 12/28/2016  . Encounter for removal of sutures 09/09/2016  . Coagulation defect, unspecified ([place]) 09/01/2016  . Underimmunization status 08/05/2016  . Hemolytic anemia ([place]) 07/26/2016  . Epistaxis, recurrent 07/26/2016  . Unspecified protein-calorie malnutrition ([place]) 07/13/2016  .  Aftercare including intermittent dialysis ([place]) 07/07/2016  . Anemia in chronic kidney disease 07/07/2016  . Hypokalemia 07/07/2016  . Iron deficiency anemia, unspecified 07/07/2016  . 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 Nonrheumatic mitral (valve)  prolapse 07/07/2016  . Other irritable bowel syndrome 07/07/2016  . Other secondary thrombocytopenia 07/07/2016  . Secondary hyperparathyroidism of renal origin ([place]) 07/07/2016  . Thrombocytopenia ([place]) 07/01/2016  . ARF (acute renal failure) ([place]) 06/25/2016  . Anxiety 06/25/2016  . Bilateral carpal tunnel syndrome 10/22/2015  . Chronic gout without tophus 10/22/2015  . Chronic nonintractable headache 10/08/2015  . Fibroid uterus 01/03/2012  . H/O vitamin D deficiency 01/03/2012  . Post-menopausal 01/03/2012  . Hereditary and idiopathic peripheral neuropathy 11/19/2011  . Intestinal malabsorption 11/19/2011  . Lichen planus 96/29/5284  . Low back pain 11/19/2011  . Diffuse spasm of esophagus 11/11/2011  . ESR raised 11/11/2011  . Postinflammatory pulmonary fibrosis ([place]) 11/11/2011  . Scleroderma ([place]) 11/16/2010  . Rheumatoid arthritis ([place]) 11/16/2010  . Raynaud's disease 11/16/2010  . Symptomatic menopausal or female climacteric states 11/16/2010    Lanice Shirts PT 05/21/2020, 4:41 PM  [facility] [address] [place], [place], [address] Phone: [phone]   Fax:  [phone]  Name: Katie Nunez MRN: [id number] Date of Birth: [birth year]

## 2020-05-21 NOTE — Therapy (Addendum)
[facility] [address] [place], [place], [address] Phone: [phone]   Fax:  [phone]  Occupational Therapy Treatment  Patient Details  Name: Katie Nunez MRN: [id number] Date of Birth: 23-Jan-1955 Referring Provider (OT): Molli Barrows   Encounter Date: 05/21/2020   OCCUPATIONAL THERAPY DISCHARGE SUMMARY   Current functional level related to goals / functional outcomes: Pt has made good overall progress however she did not fully meet all goals due to complex medical situation including dialysis.   Remaining deficits: Decreased activity tolerance, decreased strength, decreased balance, decreased coordination   Education / Equipment: Pt has been instructed in HEP, importance of gradually increasing activity, safety for ADLs and importance of upright posture. Pt verbalizes understanding of all education. Pt is being d/c as overall progress has plateaued. Plan: Patient agrees to discharge.  Patient goals were partially met.                                                                                                      ?????       OT End of Session - 05/21/20 1242    Visit Number 20    Number of Visits 25    Date for OT Re-Evaluation 05/16/20    Authorization Type [employer]    Authorization Time Period 90 day cert week 1/5    Authorization - Visit Number 20    Authorization - Number of Visits 20    OT Start Time 6283    OT Stop Time 1315    OT Time Calculation (min) 40 min    Activity Tolerance Patient tolerated treatment well    Behavior During Therapy WFL for tasks assessed/performed           Past Medical History:  Diagnosis Date  . Achalasia   . Anxiety   . Dysplasia of cervix, low grade (CIN 1)   . Environmental allergies    "all year long" (12/27/2016)  . ESRD (end stage renal disease) on dialysis [facility])    "TTS; [place]" (12/27/2016)  . Fibromyalgia   . GERD  (gastroesophageal reflux disease)   . Gout   . Hypertension   . IBS (irritable bowel syndrome)   . MVP (mitral valve prolapse)   . RA (rheumatoid arthritis) (HCC)    FOLLOWED BY DR. SHANAHAN  . Raynaud's disease   . Scleroderma ([place])   . Seasonal allergies   . Thrombocytopenia ([place]) 07/01/2016   Acute fall to 13,000 07/01/16  . Tubular adenoma 01/08/2008   CECUM  . Vitamin D deficiency     Past Surgical History:  Procedure Laterality Date  . ANKLE FRACTURE SURGERY Right   . AV FISTULA PLACEMENT Left 06/28/2016   Procedure: left arm ARTERIOVENOUS (AV) FISTULA CREATION;  Surgeon: Rosetta Posner, MD;  Location: [facility];  Service: Vascular;  Laterality: Left;  . BASCILIC VEIN TRANSPOSITION Left 09/27/2016   Procedure: LEFT UPPER ARM CEPHALIC VEIN TRANSPOSITION;  Surgeon: Rosetta Posner, MD;  Location: [facility];  Service: Vascular;  Laterality: Left;  . BREAST BIOPSY     "?  side"  . [place]  . CO2 LASER OF CERVIX    . COLONOSCOPY W/ BIOPSIES  01/08/2008  . INSERTION OF DIALYSIS CATHETER Right 06/28/2016   Procedure: INSERTION OF DIALYSIS CATHETER, right internal jugular;  Surgeon: Rosetta Posner, MD;  Location: [facility];  Service: Vascular;  Laterality: Right;  . MYOMECTOMY    . NASAL ENDOSCOPY WITH EPISTAXIS CONTROL N/A 12/29/2019   Procedure: NASAL ENDOSCOPY WITH EPISTAXIS CONTROL;  Surgeon: Leta Baptist, MD;  Location: [facility] OR;  Service: ENT;  Laterality: N/A;  . PELVIC LAPAROSCOPY  2011  . superficial thrombophlebitis Left 07-2014    There were no vitals filed for this visit.   Subjective Assessment - 05/21/20 1241    Pertinent History Scleroderma, RA,HTN, ESRD (dialysis), hx R ankle Fx, 2000, anxiety,fibromyalgia, gout, HTN, MVP, Raynaud's  disease, C section, ostructive lung disease, B CTS, hereditary and periferal neuropathy, venous reflux, ICH    Limitations NO BP LUE due to fistula    Patient Stated Goals write and use RUE better, drive agin    Currently in Pain? No/denies                         Treatment: Wall slides x 10 reps, closed chain shoulder flexion and chest press in seated min v.c Supine scapular retraction x 10 reps  Min v.c Therapist checked progress towards goals.        OT Education - 05/21/20 1607    Education Details Discusssed progress towards goals Pt. has been previously issued information regarding use of a enclosed chopper for safety with cutting. Pt reports she has purchased a long handled fork like item she can use to hold down items  with LUE while cutting for increased safety. Pt has also been provided with information regarding the cutting board with prongs to hold fruit ofr items she is cutting to increase safety(so that she will not need to hold with LUE). Discussed importance of gradually increasing activity level at home and completing at least 1 activity per day on the day she does not have  dialysis. Pt was cautioned not to retrieve anything heavier than a 1 lbs can with RUE from ovehead shelf, she should use both hands together of LUE with heavier items. Pt reports ongoing neck and shoulder stiffness, this appears to be related to pt posture. Therapsit encouraged pt wot work on upright posture at home.    Person(s) Educated Patient    Methods Explanation;Demonstration;Handout;Verbal cues    Comprehension Verbalized understanding;Returned demonstration;Verbal cues required            OT Short Term Goals - 04/09/20 1442      OT SHORT TERM GOAL #1   Title I with inital HEP.    Time 4    Period Weeks    Status Achieved      OT SHORT TERM GOAL #2   Title Pt will demonstrate improved fine motor coordination for ADLs as evidenced by decreasing 9 hole peg test score for RUE to 52 secs or less    Baseline RUE 60.56, LUE 32.81    Time 4    Period Weeks    Status Achieved   RUE 43.84s     OT SHORT TERM GOAL #3   Title Pt will perform light home management/simple cooking tasks with supervision and min v.c     Time 4    Period Weeks    Status Achieved  OT SHORT TERM GOAL #4   Title Pt will demonstrate improved RUE functional use as eveidenced by increasing box/ blocks score to 25 blocks with RUE.    Time 4    Period Weeks    Status Achieved   RUE 30 blocks on 03/14/2020     OT SHORT TERM GOAL #5   Title Pt will write a short paragraph with at least 90% legibility.    Time 4    Period Weeks    Status Achieved   100% legibility with increased time            OT Long Term Goals - 05/21/20 1243      OT LONG TERM GOAL #1   Title I with updated HEP.-04/09/20    Status Achieved   met for red putty     OT LONG TERM GOAL #2   Title Pt will perform basic home managment/ cooking modified Independently    Status Partially Met   Pt has performed but she is not performing consistently, pt reports continued challenges.     OT LONG TERM GOAL #3   Title Pt will demonstrate improved fine motor coordination for ADLs as evidenced by decreasing 9 hole peg test score for RUE to 45 secs or less.    Status Achieved      OT LONG TERM GOAL #4   Title Pt will write a short paragraph with 100% legibility.    Status Achieved   with increased time     OT LONG TERM GOAL #5   Title Pt will retrieve a light weight object at 115 shoulder flexion with pain less than or equal to 2/10.    Time 5    Period Weeks    Status Achieved   115* to achieve 1 lbs weight     OT LONG TERM GOAL #6   Title Pt will report cooking a simple meal 2x week modified Indpendently.    Time 5    Period Weeks    Status Not Met   Pt reports sometimes she cooks something 1x week     OT LONG TERM GOAL #7   Title Pt will perform laundry and fold clothes modified independently.    Time 5    Period Weeks    Status Achieved   Pt simulated this task in the clinic, pt was able to perform, she may require rest breaks     OT LONG TERM GOAL #8   Title Pt will report increased ease with removing items form the oven, stirring food and   scrambling an egg with RUE    Time 5    Period Weeks    Status Achieved   Pt demonstrated ability to perform these activities within the clinic     OT LONG TERM GOAL  #9   TITLE Pt will demonstrate improved fine motor coordination for ADLs as evidenced by decreasing 9 hole peg test score to 40 secs or less for RUE    Time 5    Period Weeks    Status Achieved   34.34 secs                Plan - 05/21/20 1603    Clinical Impression Statement For the reporting period of 03/19/20-05/21/20 pt has made good overall progress. Therapist checked goals and pt agrees with plans for d/c.    OT Occupational Profile and History Detailed Assessment- Review of Records and additional review of physical, cognitive, psychosocial history related  to current functional performance    Occupational performance deficits (Please refer to evaluation for details): ADL's;IADL's;Leisure;Rest and Sleep;Social Participation;Other    Body Structure / Function / Physical Skills ADL;Endurance;Decreased knowledge of precautions;Decreased knowledge of use of DME;Flexibility;IADL;Pain;Skin integrity;Edema;Mobility;ROM    Rehab Potential Good    OT Frequency 2x / week    OT Duration --   5 weeks   OT Treatment/Interventions Self-care/ADL training;Therapeutic exercise;DME and/or AE instruction;Therapist, nutritional;Compression bandaging;Other (comment);Manual Therapy;Scar mobilization;Coping strategies training;Energy conservation;Manual lymph drainage;Passive range of motion;Therapeutic activities    Plan d/c OT    Consulted and Agree with Plan of Care Patient           Patient will benefit from skilled therapeutic intervention in order to improve the following deficits and impairments:   Body Structure / Function / Physical Skills: ADL,Endurance,Decreased knowledge of precautions,Decreased knowledge of use of DME,Flexibility,IADL,Pain,Skin integrity,Edema,Mobility,ROM       Visit Diagnosis: Muscle weakness  (generalized)  Other symptoms and signs involving the nervous system  Other lack of coordination  Other abnormalities of gait and mobility    Problem List Patient Active Problem List   Diagnosis Date Noted  . Abnormality of gait 02/25/2020  . Protein-calorie malnutrition, severe 01/19/2020  . Epistaxis   . Sleep disturbance   . Slow transit constipation   . Hemorrhoids   . Chronic systolic congestive heart failure ([place])   . ESRD on dialysis ([place])   . Right hemiparesis ([place])   . Pressure injury of skin 12/20/2019  . Intraparenchymal hemorrhage of brain ([place]) 12/18/2019  . ICH (intracerebral hemorrhage) ([place]) 12/12/2019  . Viral disease 08/22/2019  . Chronic right-sided heart failure ([place]) 07/09/2019  . Supraventricular tachycardia ([place]) 07/09/2019  . Other cirrhosis of liver ([place]) 06/27/2019  . Heart failure ([place]) 02/21/2019  . Heart palpitations 08/14/2018  . Other fluid overload 07/27/2018  . Encounter for long-term (current) use of other medications 01/18/2018  . Hypothyroidism 01/18/2018  . Myalgia and myositis 01/18/2018  . Chronic nephritis 01/18/2018  . Other interstitial pulmonary diseases with fibrosis in diseases classified elsewhere ([place]) 01/18/2018  . Other long term (current) drug therapy 01/18/2018  . Sleep apnea 01/18/2018  . Unspecified persistent mental disorders due to conditions classified elsewhere 01/18/2018  . Venous reflux 01/18/2018  . Vitamin B12 deficiency 01/18/2018  . Vitamin D deficiency 01/18/2018  . Obstructive lung disease ([place]) 12/30/2017  . Other pruritus 12/27/2017  . Eruption cyst 12/19/2017  . 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 Chronic cough 11/11/2017  . Pulmonary hypertension ([place]) 11/11/2017  . Pulmonary arterial hypertension ([place]) 10/07/2017  . Acute ITP ([place]) 01/05/2017  . ESRD (end stage renal disease) ([place]) 12/28/2016  . Encounter for removal of sutures 09/09/2016   . Coagulation defect, unspecified ([place]) 09/01/2016  . Underimmunization status 08/05/2016  . Hemolytic anemia ([place]) 07/26/2016  . Epistaxis, recurrent 07/26/2016  . Unspecified protein-calorie malnutrition ([place]) 07/13/2016  . Aftercare including intermittent dialysis ([place]) 07/07/2016  . Anemia in chronic kidney disease 07/07/2016  . Hypokalemia 07/07/2016  . Iron deficiency anemia, unspecified 07/07/2016  . 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 Nonrheumatic mitral (valve) prolapse 07/07/2016  . Other irritable bowel syndrome 07/07/2016  . Other secondary thrombocytopenia 07/07/2016  . Secondary hyperparathyroidism of renal origin ([place]) 07/07/2016  . Thrombocytopenia ([place]) 07/01/2016  . ARF (acute renal failure) ([place]) 06/25/2016  . Anxiety 06/25/2016  . Bilateral carpal tunnel syndrome 10/22/2015  . Chronic gout without tophus 10/22/2015  . Chronic nonintractable headache 10/08/2015  .  Fibroid uterus 01/03/2012  . H/O vitamin D deficiency 01/03/2012  . Post-menopausal 01/03/2012  . Hereditary and idiopathic peripheral neuropathy 11/19/2011  . Intestinal malabsorption 11/19/2011  . Lichen planus 49/70/2637  . Low back pain 11/19/2011  . Diffuse spasm of esophagus 11/11/2011  . ESR raised 11/11/2011  . Postinflammatory pulmonary fibrosis ([place]) 11/11/2011  . Scleroderma ([place]) 11/16/2010  . Rheumatoid arthritis ([place]) 11/16/2010  . Raynaud's disease 11/16/2010  . Symptomatic menopausal or female climacteric states 11/16/2010    , 05/21/2020, 4:14 PM Theone Murdoch, OTR/L [phone] Phone: [phone] 4:18 PM 05/21/20 [facility] [address] [place], [place], [address] Phone: [phone]   Fax:  [phone]  Name: Katie Nunez MRN: [id number] Date of Birth: Mar 24, 1955

## 2020-05-21 NOTE — Patient Instructions (Signed)
Access Code: [email or web address] URL: [email or web address] Date: 05/21/2020 Prepared by: Sharlynn Oliphant  Exercises Heel Toe Raises with Counter Support - 2 x daily - 7 x weekly - 3 sets - 10 reps Side Stepping with Resistance at Thighs - 2 x daily - 7 x weekly - 2 sets - 10 reps Figure 4 Bridge - 1 x daily - 7 x weekly - 2 sets - 10 reps Supine Posterior Pelvic Tilt - 2 x daily - 7 x weekly - 2 sets - 10 reps Clamshell - 2 x daily - 7 x weekly - 2 sets - 10 reps Curl Up with Arms Crossed - 2 x daily - 7 x weekly - 2 sets - 10 reps Standing Heel Raise with Support - 2 x daily - 7 x weekly - 2 sets - 10 reps Supine March - 2 x daily - 7 x weekly - 2 sets - 10 reps

## 2020-05-27 ENCOUNTER — Ambulatory Visit: Payer: Medicare Other | Admitting: Adult Health

## 2020-05-28 ENCOUNTER — Ambulatory Visit: Payer: Medicare Other | Admitting: Adult Health

## 2020-05-28 ENCOUNTER — Ambulatory Visit: Payer: Medicare Other

## 2020-05-28 ENCOUNTER — Other Ambulatory Visit: Payer: Self-pay

## 2020-05-28 DIAGNOSIS — R278 Other lack of coordination: Secondary | ICD-10-CM

## 2020-05-28 DIAGNOSIS — R2681 Unsteadiness on feet: Secondary | ICD-10-CM | POA: Diagnosis not present

## 2020-05-28 DIAGNOSIS — M6281 Muscle weakness (generalized): Secondary | ICD-10-CM

## 2020-05-29 NOTE — Therapy (Signed)
[facility] [address] [place], [place], [address] Phone: [phone]   Fax:  [phone]  Physical Therapy Treatment/DC Summary  Patient Details  Name: Katie Nunez MRN: [id number] Date of Birth: [birth year] Referring Provider (PT): [name] MD   Encounter Date: 05/28/2020   PT End of Session - 05/28/20 0750    Visit Number 20    Number of Visits 20    Date for PT Re-Evaluation 06/04/20    Authorization Type [employer]    Equipment Utilized During Treatment Gait belt    Activity Tolerance Patient tolerated treatment well    Behavior During Therapy [facility] for tasks assessed/performed            Visits from Start of Care: 20  Current functional level related to goals / functional outcomes: Goals met or maximum function regained at this time   Remaining deficits: Activity tolerance and endurance   Education / Equipment: HEP finalized Plan: Patient agrees to discharge.  Patient goals were partially met. Patient is being discharged due to being pleased with the current functional level.  ?????      Past Medical History:  Diagnosis Date  . Achalasia   . Anxiety   . Dysplasia of cervix, low grade (CIN 1)   . Environmental allergies    "all year long" (12/27/2016)  . ESRD (end stage renal disease) on dialysis [facility])    "TTS; [place]" (12/27/2016)  . Fibromyalgia   . GERD (gastroesophageal reflux disease)   . Gout   . Hypertension   . IBS (irritable bowel syndrome)   . MVP (mitral valve prolapse)   . RA (rheumatoid arthritis) (HCC)    FOLLOWED BY DR. SHANAHAN  . Raynaud's disease   . Scleroderma ([place])   . Seasonal allergies   . Thrombocytopenia ([place]) 07/01/2016   Acute fall to 13,000 07/01/16  . Tubular adenoma 01/08/2008   CECUM  . Vitamin D deficiency     Past Surgical History:  Procedure Laterality Date  . ANKLE FRACTURE SURGERY Right   . AV FISTULA PLACEMENT Left 06/28/2016    Procedure: left arm ARTERIOVENOUS (AV) FISTULA CREATION;  Surgeon: Rosetta Posner, MD;  Location: [facility];  Service: Vascular;  Laterality: Left;  . BASCILIC VEIN TRANSPOSITION Left 09/27/2016   Procedure: LEFT UPPER ARM CEPHALIC VEIN TRANSPOSITION;  Surgeon: Rosetta Posner, MD;  Location: [facility];  Service: Vascular;  Laterality: Left;  . BREAST BIOPSY     "? side"  . CESAREAN SECTION  1994  . CO2 LASER OF CERVIX    . COLONOSCOPY W/ BIOPSIES  01/08/2008  . INSERTION OF DIALYSIS CATHETER Right 06/28/2016   Procedure: INSERTION OF DIALYSIS CATHETER, right internal jugular;  Surgeon: Rosetta Posner, MD;  Location: [facility];  Service: Vascular;  Laterality: Right;  . MYOMECTOMY    . NASAL ENDOSCOPY WITH EPISTAXIS CONTROL N/A 12/29/2019   Procedure: NASAL ENDOSCOPY WITH EPISTAXIS CONTROL;  Surgeon: Leta Baptist, MD;  Location: [facility] OR;  Service: ENT;  Laterality: N/A;  . PELVIC LAPAROSCOPY  2011  . superficial thrombophlebitis Left 07-2014    There were no vitals filed for this visit.   Subjective Assessment - 05/29/20 0748    Subjective No changes since last session, feels confident to DC at this time    Patient is accompained by: Family member    Pertinent History Scleroderma, RA, pulmonary HTN, kidney disease (dialysis) Tu,TH and Sat, Rt ankle fx/dislocation/surgery 2000    Limitations Walking  How long can you sit comfortably? unlimited    How long can you stand comfortably? unlimited    How long can you walk comfortably? unlimited    Patient Stated Goals strengthen ankle and knee R, get off of cane    Currently in Pain? No/denies    Pain Onset More than a month ago              [facility] PT Assessment - 05/29/20 0001      Timed Up and Go Test   Normal TUG (seconds) 16.2           Todays session consisted of a review of her HEP 10 reps per exercise, re-assessment of TUG, 569f outdoor ambulation with SPC and no ankle brace and a follow up with progress towards  LTGs                        PT Short Term Goals - 04/30/20 1453      PT SHORT TERM GOAL #1   Title Ind with initial HEP and able to demo back to PT w/o need of VCs    Baseline no HEP; 03/12/20 demos HEP w/o need of VCs    Time 2    Period Weeks    Status Achieved    Target Date 02/27/20      PT SHORT TERM GOAL #2   Title [facility] for adjustment and training    Baseline not using cane prior; 03/12/20 Has been using cane in clinic, adjusted to her body habitus    Time 2    Period Weeks    Status Achieved    Target Date 02/27/20             PT Long Term Goals - 05/28/20 1348      PT LONG TERM GOAL #1   Title Patient to improve 5x STS score to < 20 sec    Baseline 24 seconds; NT; 5x STS score 16.50s    Time 8    Period Weeks    Status Achieved      PT LONG TERM GOAL #2   Title Patient to demo 4+/5 R hip/knee strength throughout    Baseline 4/5 R ankle knee strength; 03/26/20 No changes; 04/30/20 MMT 4+/5 strength in RLE hip/knee with B abd 4/5; 05/28/20 R hip/knee strength 4+/5 with isometric testing    Time 8    Period Weeks    Status Achieved      PT LONG TERM GOAL #3   Title Patient to decerease TUG time to ,13.5 sec w/o AD    Baseline 18 sec TUG average of 2 trials; NT; 04/30/20 TUG time 15.36s; 05/28/20 TUG 16.2s    Time 8    Period Weeks    Status Partially Met      PT LONG TERM GOAL #4   Title Ambulate 10050fwith LRAD across outdoor surfaces under distant S    Baseline limited to in home ambulation; Ambulation limitd to 20025[place] [facility]; 04/30/20 ambulation distance limited to [id number] to fatigue; 05/28/20 No change    Time 8    Period Weeks    Status Partially Met      PT LONG TERM GOAL #5   Title Return to driving as appropriate    Baseline currently not driving; 3/165/70/17change;  04/29/20 Has returned to short distance driving    Time  8    Period Weeks    Status Achieved                  Plan - 05/28/20 0750    Clinical Impression Statement Todays skilled session focused on final review of HEP for DC, treatment performed w/o ankle brace, gait shows continued lack of knee and hip flexion but not toe scuff, continued signs of mild R hip instability during swing through and some circumduction observed. Patient appers to have met rehab potential at this time in her recovery and is readyfor home management    Personal Factors and Comorbidities Age;Comorbidity 2    Comorbidities ESRD(HD T, Th, Sat), R ankle fx 2000    Examination-Participation Restrictions Community Activity    Stability/Clinical Decision Making Stable/Uncomplicated    Rehab Potential Good    PT Frequency 2x / week    PT Duration 4 weeks    PT Treatment/Interventions ADLs/Self Care Home Management;Cryotherapy;Electrical Stimulation;Ultrasound;Therapeutic exercise;Balance training;Patient/family education;Manual techniques;Dry needling;Therapeutic activities;Neuromuscular re-education;DME Instruction;Gait training;Stair training;Functional mobility training;Orthotic Fit/Training    PT Next Visit Plan DC to [facility] and Agree with Plan of Care Patient           Patient will benefit from skilled therapeutic intervention in order to improve the following deficits and impairments:  Postural dysfunction,Decreased balance,Impaired flexibility,Abnormal gait,Decreased mobility,Difficulty walking,Decreased coordination  Visit Diagnosis: Muscle weakness (generalized)  Unsteadiness on feet  Other lack of coordination     Problem List Patient Active Problem List   Diagnosis Date Noted  . Abnormality of gait 02/25/2020  . Protein-calorie malnutrition, severe 01/19/2020  . Epistaxis   . Sleep disturbance   . Slow transit constipation   . Hemorrhoids   . Chronic systolic congestive heart failure ([place])   . ESRD on dialysis ([place])   . Right hemiparesis ([place])    . Pressure injury of skin 12/20/2019  . Intraparenchymal hemorrhage of brain ([place]) 12/18/2019  . ICH (intracerebral hemorrhage) ([place]) 12/12/2019  . Viral disease 08/22/2019  . Chronic right-sided heart failure ([place]) 07/09/2019  . Supraventricular tachycardia ([place]) 07/09/2019  . Other cirrhosis of liver ([place]) 06/27/2019  . Heart failure ([place]) 02/21/2019  . Heart palpitations 08/14/2018  . Other fluid overload 07/27/2018  . Encounter for long-term (current) use of other medications 01/18/2018  . Hypothyroidism 01/18/2018  . Myalgia and myositis 01/18/2018  . Chronic nephritis 01/18/2018  . Other interstitial pulmonary diseases with fibrosis in diseases classified elsewhere ([place]) 01/18/2018  . Other long term (current) drug therapy 01/18/2018  . Sleep apnea 01/18/2018  . Unspecified persistent mental disorders due to conditions classified elsewhere 01/18/2018  . Venous reflux 01/18/2018  . Vitamin B12 deficiency 01/18/2018  . Vitamin D deficiency 01/18/2018  . Obstructive lung disease ([place]) 12/30/2017  . Other pruritus 12/27/2017  . Eruption cyst 12/19/2017  . 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 ([place]) 11/28/2017  . Chronic cough 11/11/2017  . Pulmonary hypertension ([place]) 11/11/2017  . Pulmonary arterial hypertension ([place]) 10/07/2017  . Acute ITP ([place]) 01/05/2017  . ESRD (end stage renal disease) ([place]) 12/28/2016  . Encounter for removal of sutures 09/09/2016  . Coagulation defect, unspecified ([place]) 09/01/2016  . Underimmunization status 08/05/2016  . Hemolytic anemia ([place]) 07/26/2016  . Epistaxis, recurrent 07/26/2016  . Unspecified protein-calorie malnutrition ([place]) 07/13/2016  . Aftercare including intermittent dialysis ([place]) 07/07/2016  . Anemia in chronic kidney disease 07/07/2016  . Hypokalemia 07/07/2016  . Iron deficiency anemia,  unspecified 07/07/2016  . 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 Nonrheumatic mitral (valve) prolapse 07/07/2016   . Other irritable bowel syndrome 07/07/2016  . Other secondary thrombocytopenia 07/07/2016  . Secondary hyperparathyroidism of renal origin ([place]) 07/07/2016  . Thrombocytopenia ([place]) 07/01/2016  . ARF (acute renal failure) ([place]) 06/25/2016  . Anxiety 06/25/2016  . Bilateral carpal tunnel syndrome 10/22/2015  . Chronic gout without tophus 10/22/2015  . Chronic nonintractable headache 10/08/2015  . Fibroid uterus 01/03/2012  . H/O vitamin D deficiency 01/03/2012  . Post-menopausal 01/03/2012  . Hereditary and idiopathic peripheral neuropathy 11/19/2011  . Intestinal malabsorption 11/19/2011  . Lichen planus 18/86/7737  . Low back pain 11/19/2011  . Diffuse spasm of esophagus 11/11/2011  . ESR raised 11/11/2011  . Postinflammatory pulmonary fibrosis ([place]) 11/11/2011  . Scleroderma ([place]) 11/16/2010  . Rheumatoid arthritis ([place]) 11/16/2010  . Raynaud's disease 11/16/2010  . Symptomatic menopausal or female climacteric states 11/16/2010    Lanice Shirts PT 05/29/2020, 7:58 AM  [facility] [address] [place], [place], [address] Phone: [phone]   Fax:  [phone]  Name: Katie Nunez MRN: [id number] Date of Birth: [birth year]

## 2020-05-30 ENCOUNTER — Other Ambulatory Visit: Payer: Self-pay | Admitting: Family Medicine

## 2020-05-30 DIAGNOSIS — M79605 Pain in left leg: Secondary | ICD-10-CM

## 2020-06-05 ENCOUNTER — Other Ambulatory Visit: Payer: Self-pay | Admitting: *Deleted

## 2020-06-05 DIAGNOSIS — I890 Lymphedema, not elsewhere classified: Secondary | ICD-10-CM

## 2020-06-11 ENCOUNTER — Ambulatory Visit (INDEPENDENT_AMBULATORY_CARE_PROVIDER_SITE_OTHER): Payer: Medicare Other | Admitting: Orthopaedic Surgery

## 2020-06-11 ENCOUNTER — Ambulatory Visit: Payer: Self-pay

## 2020-06-11 ENCOUNTER — Encounter: Payer: Self-pay | Admitting: Orthopaedic Surgery

## 2020-06-11 VITALS — BP 95/60 | HR 79 | Ht 65.0 in | Wt 122.4 lb

## 2020-06-11 DIAGNOSIS — M25561 Pain in right knee: Secondary | ICD-10-CM

## 2020-06-11 DIAGNOSIS — M25511 Pain in right shoulder: Secondary | ICD-10-CM | POA: Diagnosis not present

## 2020-06-11 DIAGNOSIS — G8929 Other chronic pain: Secondary | ICD-10-CM | POA: Diagnosis not present

## 2020-06-11 DIAGNOSIS — M542 Cervicalgia: Secondary | ICD-10-CM | POA: Diagnosis not present

## 2020-06-11 NOTE — Progress Notes (Signed)
Office Visit Note   Patient: Katie Nunez           Date of Birth: 1955/08/06           MRN: [id number] Visit Date: 06/11/2020              Requested by: [name], [place],  [place] [id number] PCP: [name], MD   Assessment & Plan: Visit Diagnoses:  1. Neck pain   2. Chronic right shoulder pain   3. Chronic pain of right knee     Plan: Knee x-rays no significant degenerative changes.  She is continue to work on walking strengthening.  Continue encourage use of cane to decrease falling risk.  She can follow-up if she has increased symptoms.  Follow-Up Instructions: Return in about 2 months (around 08/11/2020).   Orders:  Orders Placed This Encounter  Procedures  . XR KNEE 3 VIEW RIGHT  . XR Shoulder Right  . XR Cervical Spine 2 or 3 views   No orders of the defined types were placed in this encounter.     Procedures: No procedures performed   Clinical Data: No additional findings.   Subjective: Chief Complaint  Patient presents with  . Neck - Pain  . Right Shoulder - Pain  . Right Knee - Pain    HPI 65 year old female returns with chronic neck and right shoulder pain.  She states she has been trying to help her compensate had previous CVA 2021 with weakness on the right side.  She has had some problems with her right knee she has been through therapy which is helped somewhat.  She ambulates with a cane.  Review of Systems positive for rheumatoid arthritis, chronic renal failure on dialysis.  Scleroderma, previous CVAs partial right hemiparesis.  Pulmonary hypertension.  Previous ankle fracture.  Dialysis since 2018.   Objective: Vital Signs: BP 95/60   Pulse 79   Ht '5\' 5"'  (1.651 m)   Wt 122 lb 5.7 oz (55.5 kg)   LMP 11/15/2008   BMI 20.36 kg/m   Physical Exam Constitutional:      Appearance: She is well-developed.  HENT:     Head: Normocephalic.     Right Ear: External ear normal.     Left Ear: External  ear normal.  Eyes:     Pupils: Pupils are equal, round, and reactive to light.  Neck:     Thyroid: No thyromegaly.     Trachea: No tracheal deviation.  Cardiovascular:     Rate and Rhythm: Normal rate.  Pulmonary:     Effort: Pulmonary effort is normal.  Abdominal:     Palpations: Abdomen is soft.  Skin:    General: Skin is warm and dry.  Neurological:     Mental Status: She is alert and oriented to person, place, and time.  Psychiatric:        Behavior: Behavior normal.     Ortho Exam patient has some brachial plexus tenderness.  Decreased strength right side.  Specialty Comments:  No specialty comments available.  Imaging: No results found.   PMFS History: Patient Active Problem List   Diagnosis Date Noted  . Abnormality of gait 02/25/2020  . Protein-calorie malnutrition, severe 01/19/2020  . Epistaxis   . Sleep disturbance   . Slow transit constipation   . Hemorrhoids   . Chronic systolic congestive heart failure ([place])   . ESRD on dialysis ([place])   . Right hemiparesis ([place])   .  Pressure injury of skin 12/20/2019  . Intraparenchymal hemorrhage of brain ([place]) 12/18/2019  . ICH (intracerebral hemorrhage) ([place]) 12/12/2019  . Viral disease 08/22/2019  . Chronic right-sided heart failure ([place]) 07/09/2019  . Supraventricular tachycardia ([place]) 07/09/2019  . Other cirrhosis of liver ([place]) 06/27/2019  . Heart failure ([place]) 02/21/2019  . Heart palpitations 08/14/2018  . Other fluid overload 07/27/2018  . Encounter for long-term (current) use of other medications 01/18/2018  . Hypothyroidism 01/18/2018  . Myalgia and myositis 01/18/2018  . Chronic nephritis 01/18/2018  . Other interstitial pulmonary diseases with fibrosis in diseases classified elsewhere ([place]) 01/18/2018  . Other long term (current) drug therapy 01/18/2018  . Sleep apnea 01/18/2018  . Unspecified persistent mental disorders due to conditions classified elsewhere 01/18/2018  . Venous reflux 01/18/2018   . Vitamin B12 deficiency 01/18/2018  . Vitamin D deficiency 01/18/2018  . Obstructive lung disease ([place]) 12/30/2017  . Other pruritus 12/27/2017  . Eruption cyst 12/19/2017  . 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 Chronic cough 11/11/2017  . Pulmonary hypertension ([place]) 11/11/2017  . Pulmonary arterial hypertension ([place]) 10/07/2017  . Acute ITP ([place]) 01/05/2017  . ESRD (end stage renal disease) ([place]) 12/28/2016  . Encounter for removal of sutures 09/09/2016  . Coagulation defect, unspecified ([place]) 09/01/2016  . Underimmunization status 08/05/2016  . Hemolytic anemia ([place]) 07/26/2016  . Epistaxis, recurrent 07/26/2016  . Unspecified protein-calorie malnutrition ([place]) 07/13/2016  . Aftercare including intermittent dialysis ([place]) 07/07/2016  . Anemia in chronic kidney disease 07/07/2016  . Hypokalemia 07/07/2016  . Iron deficiency anemia, unspecified 07/07/2016  . 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 Nonrheumatic mitral (valve) prolapse 07/07/2016  . Other irritable bowel syndrome 07/07/2016  . Other secondary thrombocytopenia 07/07/2016  . Secondary hyperparathyroidism of renal origin ([place]) 07/07/2016  . Thrombocytopenia ([place]) 07/01/2016  . ARF (acute renal failure) ([place]) 06/25/2016  . Anxiety 06/25/2016  . Bilateral carpal tunnel syndrome 10/22/2015  . Chronic gout without tophus 10/22/2015  . Chronic nonintractable headache 10/08/2015  . Fibroid uterus 01/03/2012  . H/O vitamin D deficiency 01/03/2012  . Post-menopausal 01/03/2012  . Hereditary and idiopathic peripheral neuropathy 11/19/2011  . Intestinal malabsorption 11/19/2011  . Lichen planus 98/92/1194  . Low back pain 11/19/2011  . Diffuse spasm of esophagus 11/11/2011  . ESR raised 11/11/2011  . Postinflammatory pulmonary fibrosis ([place]) 11/11/2011  . Scleroderma ([place]) 11/16/2010  . Rheumatoid arthritis ([place]) 11/16/2010  . Raynaud's disease 11/16/2010  .  Symptomatic menopausal or female climacteric states 11/16/2010   Past Medical History:  Diagnosis Date  . Achalasia   . Anxiety   . Dysplasia of cervix, low grade (CIN 1)   . Environmental allergies    "all year long" (12/27/2016)  . ESRD (end stage renal disease) on dialysis [facility])    "TTS; [place] [facility]" (12/27/2016)  . Fibromyalgia   . GERD (gastroesophageal reflux disease)   . Gout   . Hypertension   . IBS (irritable bowel syndrome)   . MVP (mitral valve prolapse)   . RA (rheumatoid arthritis) (HCC)    FOLLOWED BY DR. SHANAHAN  . Raynaud's disease   . Scleroderma ([place])   . Seasonal allergies   . Thrombocytopenia ([place]) 07/01/2016   Acute fall to 13,000 07/01/16  . Tubular adenoma 01/08/2008   CECUM  . Vitamin D deficiency     Family History  Problem Relation Age of Onset  . Hypertension Mother   . Diabetes Mother   . Heart disease Father   .  Hypertension Maternal Aunt   . Diabetes Maternal Grandmother   . Heart disease Paternal Grandfather   . Cerebral palsy Cousin        1ST COUSIN?  . Diabetes Paternal Grandmother     Past Surgical History:  Procedure Laterality Date  . ANKLE FRACTURE SURGERY Right   . AV FISTULA PLACEMENT Left 06/28/2016   Procedure: left arm ARTERIOVENOUS (AV) FISTULA CREATION;  Surgeon: Rosetta Posner, MD;  Location: [facility];  Service: Vascular;  Laterality: Left;  . BASCILIC VEIN TRANSPOSITION Left 09/27/2016   Procedure: LEFT UPPER ARM CEPHALIC VEIN TRANSPOSITION;  Surgeon: Rosetta Posner, MD;  Location: [facility];  Service: Vascular;  Laterality: Left;  . BREAST BIOPSY     "? side"  . CESAREAN SECTION  1994  . CO2 LASER OF CERVIX    . COLONOSCOPY W/ BIOPSIES  01/08/2008  . INSERTION OF DIALYSIS CATHETER Right 06/28/2016   Procedure: INSERTION OF DIALYSIS CATHETER, right internal jugular;  Surgeon: Rosetta Posner, MD;  Location: [facility];  Service: Vascular;  Laterality: Right;  . MYOMECTOMY    . NASAL ENDOSCOPY WITH EPISTAXIS CONTROL N/A 12/29/2019    Procedure: NASAL ENDOSCOPY WITH EPISTAXIS CONTROL;  Surgeon: Leta Baptist, MD;  [facility];  Service: ENT;  Laterality: N/A;  . PELVIC LAPAROSCOPY  2011  . superficial thrombophlebitis Left 07-2014   Social History   Occupational History  . Not on file  Tobacco Use  . Smoking status: Never Smoker  . Smokeless tobacco: Never Used  Vaping Use  . Vaping Use: Never used  Substance and Sexual Activity  . Alcohol use: No  . Drug use: No  . Sexual activity: Not Currently    Birth control/protection: Post-menopausal

## 2020-06-17 ENCOUNTER — Other Ambulatory Visit: Payer: Self-pay

## 2020-06-17 ENCOUNTER — Ambulatory Visit (HOSPITAL_COMMUNITY)
Admission: RE | Admit: 2020-06-17 | Discharge: 2020-06-17 | Disposition: A | Discharge status: Home / Self Care | Payer: Medicare Other | Source: Ambulatory Visit | Attending: Vascular Surgery | Admitting: Vascular Surgery

## 2020-06-17 ENCOUNTER — Ambulatory Visit (INDEPENDENT_AMBULATORY_CARE_PROVIDER_SITE_OTHER): Payer: Medicare Other | Admitting: Vascular Surgery

## 2020-06-17 ENCOUNTER — Encounter: Payer: Self-pay | Admitting: Vascular Surgery

## 2020-06-17 VITALS — BP 104/67 | HR 77 | Resp 20 | Ht 65.0 in | Wt 123.9 lb

## 2020-06-17 DIAGNOSIS — I890 Lymphedema, not elsewhere classified: Secondary | ICD-10-CM | POA: Diagnosis present

## 2020-06-17 DIAGNOSIS — M79605 Pain in left leg: Secondary | ICD-10-CM | POA: Diagnosis not present

## 2020-06-17 NOTE — Progress Notes (Signed)
[facility] [place]  Patient name: Katie Nunez MRN: [id number] DOB: July 28, 1955 Sex: female  REASON FOR VISIT: Evaluation lower extremity pain  HPI: Katie Nunez is a 65 y.o. female known to our service from prior AV access placement.  She currently has dialysis via a left upper arm fistula and has had excellent use of this. She does have multiple diagnoses including fibromyalgia scleroderma and Raynaud's disease.  Patient reports left lower extremity pain especially when changing positions (e.g. laying down at night to go to bed, or changing positions from seated to standing).  The patient does not have typical symptoms of claudication, ischemic rest pain, or ischemic ulceration.  She suffered a stroke recently and has been limited in her ambulation.  She is still active and able to walk.  Past Medical History:  Diagnosis Date  . Achalasia   . Anxiety   . Dysplasia of cervix, low grade (CIN 1)   . Environmental allergies    "all year long" (12/27/2016)  . ESRD (end stage renal disease) on dialysis [facility])    "TTS; [facility]" (12/27/2016)  . Fibromyalgia   . GERD (gastroesophageal reflux disease)   . Gout   . Hypertension   . IBS (irritable bowel syndrome)   . MVP (mitral valve prolapse)   . RA (rheumatoid arthritis) (HCC)    FOLLOWED BY DR. SHANAHAN  . Raynaud's disease   . Scleroderma ([place])   . Seasonal allergies   . Thrombocytopenia ([place]) 07/01/2016   Acute fall to 13,000 07/01/16  . Tubular adenoma 01/08/2008   CECUM  . Vitamin D deficiency     Family History  Problem Relation Age of Onset  . Hypertension Mother   . Diabetes Mother   . Heart disease Father   . Hypertension Maternal Aunt   . Diabetes Maternal Grandmother   . Heart disease Paternal Grandfather   . Cerebral palsy Cousin        1ST COUSIN?  . Diabetes Paternal Grandmother     SOCIAL HISTORY: Social History   Tobacco Use  . Smoking  status: Never Smoker  . Smokeless tobacco: Never Used  Substance Use Topics  . Alcohol use: No    Allergies  Allergen Reactions  . Other Anaphylaxis and Other (See Comments)    Do not use polyflux membrane.  Use alternate Other reaction(s): heart racing  . Savella [Milnacipran Hcl] Palpitations and Other (See Comments)    Unknown  . Tape Rash and Other (See Comments)    Itch- unsure if it was paper or adhesive tape    Current Outpatient Medications  Medication Sig Dispense Refill  . atorvastatin (LIPITOR) 40 MG tablet Take 1 tablet (40 mg total) by mouth daily. 90 tablet 3  . azelastine (ASTELIN) 0.1 % nasal spray Place 1 spray into both nostrils 2 (two) times daily as needed for rhinitis. Use in each nostril as directed 30 mL 12  . calcium acetate (PHOSLO) 667 MG capsule Take by mouth.    . camphor-menthol (SARNA) lotion Apply topically 2 (two) times daily. 222 mL 0  . clonazePAM (KLONOPIN) 0.5 MG tablet Take 1 tablet (0.5 mg total) by mouth 2 (two) times daily as needed for anxiety. 30 tablet 0  . cyclobenzaprine (FLEXERIL) 5 MG tablet Take 1 tablet (5 mg total) by mouth every 8 (eight) hours as needed for muscle spasms. 60 tablet 4  . docusate sodium (COLACE) 100 MG capsule Take 2 capsules (200 mg total) by  mouth daily. 60 capsule 0  . Docusate Sodium (DSS) 100 MG CAPS Take by mouth.    . famotidine (PEPCID) 20 MG tablet Take 1 tablet (20 mg total) by mouth daily as needed for heartburn or indigestion. 30 tablet 0  . gabapentin (NEURONTIN) 100 MG capsule Take 1 capsule (100 mg total) by mouth 2 (two) times daily as needed. 60 capsule 0  . gabapentin (NEURONTIN) 100 MG capsule Take by mouth.    . hydrocerin (EUCERIN) CREA Apply 1 application topically 2 (two) times daily. To dry skin--avoid IV site 454 g 0  . hydrocortisone (ANUSOL-HC) 2.5 % rectal cream Place rectally 2 (two) times daily as needed for hemorrhoids or anal itching. 30 g 0  . hydrOXYzine (ATARAX/VISTARIL) 25 MG tablet  Take 25 mg by mouth every 8 (eight) hours as needed for itching.     . iron sucrose in sodium chloride 0.9 % 100 mL Iron Sucrose (Venofer)    . levocetirizine (XYZAL) 5 MG tablet SMARTSIG:1 Tablet(s) By Mouth Every Evening    . lidocaine-prilocaine (EMLA) cream Apply 1 application topically as needed. Apply to toes as needed for pain 30 g 3  . lubiprostone (AMITIZA) 8 MCG capsule Take 8 mcg by mouth 2 (two) times daily with a meal.    . macitentan (OPSUMIT) 10 MG tablet Take 10 mg by mouth daily.    Marland Kitchen menthol-cetylpyridinium (CEPACOL) 3 MG lozenge Take 1 lozenge (3 mg total) by mouth as needed for sore throat. 100 tablet 12  . Methoxy PEG-Epoetin Beta (MIRCERA IJ) Mircera    . multivitamin (RENA-VIT) TABS tablet Take 1 tablet by mouth daily at 6 (six) AM. 30 tablet 0  . oxymetazoline (AFRIN) 0.05 % nasal spray Place 1 spray into both nostrils 2 (two) times daily as needed for congestion. 30 mL 0  . pentoxifylline (TRENTAL) 400 MG CR tablet Take 1 tablet (400 mg total) by mouth daily. 30 tablet 0  . psyllium (HYDROCIL/METAMUCIL) 95 % PACK Take 1 packet by mouth 2 (two) times daily. 240 each   . Selexipag (UPTRAVI) 800 MCG TABS Take 1 tablet (800 mcg total) by mouth in the morning and at bedtime. 60 tablet 0  . sodium chloride (OCEAN) 0.65 % SOLN nasal spray Place 1 spray into both nostrils 5 (five) times daily. At least 5 times a day  0  . tadalafil, PAH, (ADCIRCA) 20 MG tablet Take 1 tablet (20 mg total) by mouth daily. 30 tablet 0  . ambrisentan (LETAIRIS) 5 MG tablet Take 5 mg by mouth daily.  (Patient not taking: No sig reported)     No current facility-administered medications for this visit.    REVIEW OF SYSTEMS:  [X]  denotes positive finding, [ ]  denotes negative finding Cardiac  Comments:  Chest pain or chest pressure:    Shortness of breath upon exertion:    Short of breath when lying flat:    Irregular heart rhythm:        Vascular    Pain in calf, thigh, or hip brought on by  ambulation:    Pain in feet at night that wakes you up from your sleep:     Blood clot in your veins:    Leg swelling:  x         PHYSICAL EXAM: Vitals:   06/17/20 1553  BP: 104/67  Pulse: 77  Resp: 20  Weight: 123 lb 14.4 oz (56.2 kg)  Height: 5\' 5"  (1.651 m)    GENERAL: The patient  is a well-nourished female, in no acute distress. The vital signs are documented above. CARDIOVASCULAR: Palpable radial pulses bilaterally.  Excellent thrill in her left upper arm AV fistula.  1+ PT pulse bilaterally. PULMONARY: There is good air exchange  MUSCULOSKELETAL: There are no major deformities or cyanosis. NEUROLOGIC: No focal weakness or paresthesias are detected. SKIN: There are no ulcers or rashes noted.  Thickening and leathery changes from her calves down onto her feet and toes PSYCHIATRIC: The patient has a normal affect.  DATA:  ABI 06/17/20    MEDICAL ISSUES: 65 year old woman with end-stage renal disease and lower extremity pain of unclear etiology. No evidence of hemodynamically significant peripheral arterial disease on clinical exam or noninvasive testing today.  Follow-up with me as needed.  Yevonne Aline. Stanford Breed, MD [facility] Phone Number: [phone] 06/17/2020 5:55 PM

## 2020-06-18 ENCOUNTER — Ambulatory Visit
Admission: RE | Admit: 2020-06-18 | Discharge: 2020-06-18 | Disposition: A | Discharge status: Home / Self Care | Payer: Medicare Other | Source: Ambulatory Visit | Attending: Family Medicine | Admitting: Family Medicine

## 2020-06-18 DIAGNOSIS — M79605 Pain in left leg: Secondary | ICD-10-CM

## 2020-06-23 ENCOUNTER — Telehealth: Payer: Self-pay

## 2020-06-23 NOTE — Telephone Encounter (Signed)
Please advise. Patient was seen in the office on 06/11/2020. Would you like for me to make return appointment?

## 2020-06-23 NOTE — Telephone Encounter (Signed)
Pt called stating that her left knee popped out and back into place she can put a little pressure on it but it is still sensitive.   Pt stated that it is slowly getting better she just wanted to call and let him know and if she needed to come into the office

## 2020-06-25 ENCOUNTER — Ambulatory Visit (INDEPENDENT_AMBULATORY_CARE_PROVIDER_SITE_OTHER): Payer: Medicare Other | Admitting: Emergency Medicine

## 2020-06-25 ENCOUNTER — Other Ambulatory Visit: Payer: Self-pay

## 2020-06-25 ENCOUNTER — Encounter: Payer: Self-pay | Admitting: Emergency Medicine

## 2020-06-25 DIAGNOSIS — R053 Chronic cough: Secondary | ICD-10-CM

## 2020-06-25 DIAGNOSIS — I2721 Secondary pulmonary arterial hypertension: Secondary | ICD-10-CM

## 2020-06-25 DIAGNOSIS — J8410 Pulmonary fibrosis, unspecified: Secondary | ICD-10-CM

## 2020-06-25 DIAGNOSIS — M359 Systemic involvement of connective tissue, unspecified: Secondary | ICD-10-CM

## 2020-06-25 NOTE — Progress Notes (Signed)
Subjective:    Patient ID: Katie Nunez, female    DOB: 29-Sep-1955, 65 y.o.   MRN: [id number]  Cough Pertinent negatives include no ear pain, eye redness, fever, headaches, postnasal drip, rash, rhinorrhea, sore throat, shortness of breath or wheezing.   ROV 09/08/2018 --Katie Nunez is 36, a never smoker with a history of scleroderma, rheumatoid arthritis, Raynaud's phenomenon, end-stage renal disease on hemodialysis.  She also has a history of ITP in the past.  She has pulmonary arterial hypertension in the setting of all the above. She had a repeat R heart cath 08/25/2018 at [facility] >> PAPmean 44, PAOP 15, PVR 4.7, CO 6.2; is currently managed on ambrisentan, tadalafil, and just started selexipag.   Chest CT 10/05/2017 showed no evidence of ILD, some mild lingular of RML scar.  She had a repeat chest x-ray done in July of this year, showed interstitial prominence.  Nonspecific finding  She has mixed obstruction and restriction on her pulmonary function testing.  We have not had her on scheduled bronchodilator therapy. She still has albuterol available and uses it very rarely.  Reports minimal dyspnea - does have some limitations due to myalgias, is bothered some by stomach bloating.  She has chronic cough with GERD and rhinitis.  She reports that she is still having nasal mucous and obstruction, does get dried out. She is on flonase, ipratropium seems to be causing the drying. She still has to deal with the thick mucous, still coughs. Hasn't been taking singulair. Zyrtec was changed to Xyzal. Not interested in getting immunotherapy right now. She uses nasal washes prn.    ROV 06/25/20 --follow-up visit for 65 year old woman with a history of scleroderma, rheumatoid arthritis, Raynaud's phenomenon and end-stage renal disease on hemodialysis.  Also with a history of ITP.  She has been on Ambrisentan tadalafil and selexipag for PAH, followed at [facility].  Her tadalafil was recently discontinued due to  systemic hypotension especially with HD. She has had chest CT that did show not show significant interstitial lung disease 10/05/2017.   Minimal obstruction on pulmonary function testing, has albuterol available to use if needed.  She does deal with chronic cough that is been principally driven by allergic rhinitis. She has some throat clearing, PND that have increased her cough lately, clear sputum.  Has not used her astelin NS. She is not taking her xyzal right now.  Since I have seen her last she has been treated for hepatic clot, ? Portal vein thrombosis. Then for a CVA. Off anticoagulation.    Review of Systems  Constitutional:  Negative for fever and unexpected weight change.  HENT:  Positive for congestion and sneezing. Negative for dental problem, ear pain, nosebleeds, postnasal drip, rhinorrhea, sinus pressure, sore throat and trouble swallowing.   Eyes:  Negative for redness and itching.  Respiratory:  Positive for cough. Negative for chest tightness, shortness of breath and wheezing.   Cardiovascular:  Negative for palpitations and leg swelling.  Gastrointestinal:  Negative for nausea and vomiting.  Genitourinary:  Negative for dysuria.  Musculoskeletal:  Negative for joint swelling.  Skin:  Negative for rash.  Neurological:  Negative for headaches.  Hematological:  Does not bruise/bleed easily.  Psychiatric/Behavioral:  Negative for dysphoric mood. The patient is not nervous/anxious.   Past Medical History:  Diagnosis Date   Achalasia    Anxiety    Dysplasia of cervix, low grade (CIN 1)    Environmental allergies    "all year long" (12/27/2016)  ESRD (end stage renal disease) on dialysis ([place])    "TTS; [place]" (12/27/2016)   Fibromyalgia    GERD (gastroesophageal reflux disease)    Gout    Hypertension    IBS (irritable bowel syndrome)    MVP (mitral valve prolapse)    RA (rheumatoid arthritis) ([place])    FOLLOWED BY DR. SHANAHAN   Raynaud's disease    Scleroderma  ([place])    Seasonal allergies    Thrombocytopenia ([place]) 07/01/2016   Acute fall to 13,000 07/01/16   Tubular adenoma 01/08/2008   CECUM   Vitamin D deficiency      Family History  Problem Relation Age of Onset   Hypertension Mother    Diabetes Mother    Heart disease Father    Hypertension Maternal Aunt    Diabetes Maternal Grandmother    Heart disease Paternal Grandfather    Cerebral palsy Cousin        1ST COUSIN?   Diabetes Paternal Grandmother      Social History   Socioeconomic History   Marital status: Married    Spouse name: Not on file   Number of children: Not on file   Years of education: Not on file   Highest education level: Not on file  Occupational History   Not on file  Tobacco Use   Smoking status: Never   Smokeless tobacco: Never  Vaping Use   Vaping Use: Never used  Substance and Sexual Activity   Alcohol use: No   Drug use: No   Sexual activity: Not Currently    Birth control/protection: Post-menopausal  Other Topics Concern   Not on file  Social History Narrative   Lives in [place]   Social Determinants of Health   Financial Resource Strain: Not on file  Food Insecurity: Not on file  Transportation Needs: Not on file  Physical Activity: Not on file  Stress: Not on file  Social Connections: Not on file  Intimate Partner Violence: Not on file     Allergies  Allergen Reactions   Other Anaphylaxis and Other (See Comments)    Do not use polyflux membrane.  Use alternate Other reaction(s): heart racing   Savella [Milnacipran Hcl] Palpitations and Other (See Comments)    Unknown   Tape Rash and Other (See Comments)    Itch- unsure if it was paper or adhesive tape     Outpatient Medications Prior to Visit  Medication Sig Dispense Refill   ambrisentan (LETAIRIS) 5 MG tablet Take 5 mg by mouth daily.     atorvastatin (LIPITOR) 40 MG tablet Take 1 tablet (40 mg total) by mouth daily. 90 tablet 3   azelastine (ASTELIN) 0.1 % nasal spray  Place 1 spray into both nostrils 2 (two) times daily as needed for rhinitis. Use in each nostril as directed 30 mL 12   calcium acetate (PHOSLO) 667 MG capsule Take by mouth.     camphor-menthol (SARNA) lotion Apply topically 2 (two) times daily. 222 mL 0   clonazePAM (KLONOPIN) 0.5 MG tablet Take 1 tablet (0.5 mg total) by mouth 2 (two) times daily as needed for anxiety. 30 tablet 0   cyclobenzaprine (FLEXERIL) 5 MG tablet Take 1 tablet (5 mg total) by mouth every 8 (eight) hours as needed for muscle spasms. 60 tablet 4   docusate sodium (COLACE) 100 MG capsule Take 2 capsules (200 mg total) by mouth daily. 60 capsule 0   Docusate Sodium (DSS) 100 MG CAPS Take by mouth.  famotidine (PEPCID) 20 MG tablet Take 1 tablet (20 mg total) by mouth daily as needed for heartburn or indigestion. 30 tablet 0   gabapentin (NEURONTIN) 100 MG capsule Take 1 capsule (100 mg total) by mouth 2 (two) times daily as needed. 60 capsule 0   hydrocerin (EUCERIN) CREA Apply 1 application topically 2 (two) times daily. To dry skin--avoid IV site 454 g 0   hydrocortisone (ANUSOL-HC) 2.5 % rectal cream Place rectally 2 (two) times daily as needed for hemorrhoids or anal itching. 30 g 0   hydrOXYzine (ATARAX/VISTARIL) 25 MG tablet Take 25 mg by mouth every 8 (eight) hours as needed for itching.      iron sucrose in sodium chloride 0.9 % 100 mL Iron Sucrose (Venofer)     levocetirizine (XYZAL) 5 MG tablet SMARTSIG:1 Tablet(s) By Mouth Every Evening     lidocaine-prilocaine (EMLA) cream Apply 1 application topically as needed. Apply to toes as needed for pain 30 g 3   lubiprostone (AMITIZA) 8 MCG capsule Take 8 mcg by mouth 2 (two) times daily with a meal.     macitentan (OPSUMIT) 10 MG tablet Take 10 mg by mouth daily.     menthol-cetylpyridinium (CEPACOL) 3 MG lozenge Take 1 lozenge (3 mg total) by mouth as needed for sore throat. 100 tablet 12   Methoxy PEG-Epoetin Beta (MIRCERA IJ) Mircera     multivitamin (RENA-VIT)  TABS tablet Take 1 tablet by mouth daily at 6 (six) AM. 30 tablet 0   oxymetazoline (AFRIN) 0.05 % nasal spray Place 1 spray into both nostrils 2 (two) times daily as needed for congestion. 30 mL 0   pentoxifylline (TRENTAL) 400 MG CR tablet Take 1 tablet (400 mg total) by mouth daily. 30 tablet 0   psyllium (HYDROCIL/METAMUCIL) 95 % PACK Take 1 packet by mouth 2 (two) times daily. 240 each    Selexipag (UPTRAVI) 800 MCG TABS Take 1 tablet (800 mcg total) by mouth in the morning and at bedtime. 60 tablet 0   sodium chloride (OCEAN) 0.65 % SOLN nasal spray Place 1 spray into both nostrils 5 (five) times daily. At least 5 times a day  0   gabapentin (NEURONTIN) 100 MG capsule Take by mouth. (Patient not taking: Reported on 06/25/2020)     tadalafil, PAH, (ADCIRCA) 20 MG tablet Take 1 tablet (20 mg total) by mouth daily. (Patient not taking: Reported on 06/25/2020) 30 tablet 0   No facility-administered medications prior to visit.        Objective:   Physical Exam Vitals:   06/25/20 1630 06/25/20 1639  BP:  100/60  Pulse:  92  Temp:  (!) 97.4 F (36.3 C)  TempSrc:  Temporal  SpO2:  93%  Weight:  124 lb (56.2 kg)  Height: 5\' 5"  (1.651 m) 5\' 5"  (1.651 m)   Gen: Pleasant, well-nourished, in no distress,  normal affect  ENT: No lesions,  mouth clear,  oropharynx clear, no postnasal drip  Neck: No JVD, no stridor  Lungs: No use of accessory muscles, decreased at bases, no wheeze or crackles.   Cardiovascular: RRR, heart sounds normal, no murmur or gallops, no peripheral edema  Musculoskeletal: No deformities, no cyanosis or clubbing  Neuro: alert, non focal  Skin: Warm, no lesions or rash     Assessment & Plan:  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 ([place]) Followed at [facility].  Her tadalafil was recently discontinued due to systemic hypertension and HD, she remains on selexipag and Ambrisentan.  They are considering a repeat right heart  catheterization  Postinflammatory pulmonary fibrosis (HCC) No clear evidence for interstitial disease related to her autoimmune process based on high-resolution CT.  She does have some basilar atelectatic change.  Her most recent chest x-ray was reassuring.  I will recheck a chest x-ray in 1 year  Chronic cough Associated with her mucus burden, allergic rhinitis.  She is not currently on her Xyzal and we will restart this now.  Consider ramping up her rhinitis regimen if this is inadequate.  Baltazar Apo, MD, PhD 06/25/2020, 5:09 PM  Pulmonary and Critical Care [phone] or if no answer [phone]

## 2020-06-25 NOTE — Assessment & Plan Note (Signed)
No clear evidence for interstitial disease related to her autoimmune process based on high-resolution CT.  She does have some basilar atelectatic change.  Her most recent chest x-ray was reassuring.  I will recheck a chest x-ray in 1 year

## 2020-06-25 NOTE — Patient Instructions (Addendum)
Continue Ambrisentan and selexipag as ordered by Dr. Gilles Chiquito at [facility]. Restart your Xyzal once daily. Keep albuterol available to use 2 puffs if needed for shortness of breath, chest tightness, wheezing. We will follow-up in 1 year with a chest x-ray Please call to be seen sooner if you have any changes in your breathing or any new respiratory symptoms.

## 2020-06-25 NOTE — Assessment & Plan Note (Signed)
Followed at [facility].  Her tadalafil was recently discontinued due to systemic hypertension and HD, she remains on selexipag and Ambrisentan.  They are considering a repeat right heart catheterization

## 2020-06-25 NOTE — Assessment & Plan Note (Signed)
Associated with her mucus burden, allergic rhinitis.  She is not currently on her Xyzal and we will restart this now.  Consider ramping up her rhinitis regimen if this is inadequate.

## 2020-07-12 DIAGNOSIS — E876 Hypokalemia: Secondary | ICD-10-CM | POA: Diagnosis not present

## 2020-07-12 DIAGNOSIS — D696 Thrombocytopenia, unspecified: Secondary | ICD-10-CM | POA: Diagnosis not present

## 2020-07-12 DIAGNOSIS — N186 End stage renal disease: Secondary | ICD-10-CM | POA: Diagnosis not present

## 2020-07-12 DIAGNOSIS — D6959 Other secondary thrombocytopenia: Secondary | ICD-10-CM | POA: Diagnosis not present

## 2020-07-12 DIAGNOSIS — D509 Iron deficiency anemia, unspecified: Secondary | ICD-10-CM | POA: Diagnosis not present

## 2020-07-12 DIAGNOSIS — Z992 Dependence on renal dialysis: Secondary | ICD-10-CM | POA: Diagnosis not present

## 2020-07-12 DIAGNOSIS — N2581 Secondary hyperparathyroidism of renal origin: Secondary | ICD-10-CM | POA: Diagnosis not present

## 2020-07-12 DIAGNOSIS — D631 Anemia in chronic kidney disease: Secondary | ICD-10-CM | POA: Diagnosis not present

## 2020-07-15 DIAGNOSIS — D6959 Other secondary thrombocytopenia: Secondary | ICD-10-CM | POA: Diagnosis not present

## 2020-07-15 DIAGNOSIS — N2581 Secondary hyperparathyroidism of renal origin: Secondary | ICD-10-CM | POA: Diagnosis not present

## 2020-07-15 DIAGNOSIS — N186 End stage renal disease: Secondary | ICD-10-CM | POA: Diagnosis not present

## 2020-07-15 DIAGNOSIS — E876 Hypokalemia: Secondary | ICD-10-CM | POA: Diagnosis not present

## 2020-07-15 DIAGNOSIS — D509 Iron deficiency anemia, unspecified: Secondary | ICD-10-CM | POA: Diagnosis not present

## 2020-07-15 DIAGNOSIS — D696 Thrombocytopenia, unspecified: Secondary | ICD-10-CM | POA: Diagnosis not present

## 2020-07-15 DIAGNOSIS — Z992 Dependence on renal dialysis: Secondary | ICD-10-CM | POA: Diagnosis not present

## 2020-07-15 DIAGNOSIS — D631 Anemia in chronic kidney disease: Secondary | ICD-10-CM | POA: Diagnosis not present

## 2020-07-16 ENCOUNTER — Other Ambulatory Visit: Payer: Self-pay

## 2020-07-16 ENCOUNTER — Ambulatory Visit (INDEPENDENT_AMBULATORY_CARE_PROVIDER_SITE_OTHER): Payer: Medicare Other

## 2020-07-16 ENCOUNTER — Encounter: Payer: Self-pay | Admitting: Podiatrist

## 2020-07-16 ENCOUNTER — Ambulatory Visit (INDEPENDENT_AMBULATORY_CARE_PROVIDER_SITE_OTHER): Payer: Medicare Other | Admitting: Podiatrist

## 2020-07-16 DIAGNOSIS — L84 Corns and callosities: Secondary | ICD-10-CM

## 2020-07-16 DIAGNOSIS — M21619 Bunion of unspecified foot: Secondary | ICD-10-CM

## 2020-07-16 DIAGNOSIS — R609 Edema, unspecified: Secondary | ICD-10-CM

## 2020-07-16 DIAGNOSIS — M19071 Primary osteoarthritis, right ankle and foot: Secondary | ICD-10-CM | POA: Diagnosis not present

## 2020-07-16 DIAGNOSIS — M21612 Bunion of left foot: Secondary | ICD-10-CM | POA: Diagnosis not present

## 2020-07-16 DIAGNOSIS — M21611 Bunion of right foot: Secondary | ICD-10-CM | POA: Diagnosis not present

## 2020-07-16 NOTE — Patient Instructions (Signed)
Try capsaicin cream-  usually available at the foot care isle or diabetic care isle of the drug store-  we can order the compounded medication if this doesn't help too.   Lamisil (terbinafine) is a good cream to use between the toes 4/5 of the left foot for fungus.

## 2020-07-16 NOTE — Progress Notes (Signed)
Chief Complaint  Patient presents with   Callouses    Bilateral calluses great toes. Pt states she is dragging her foot with is causing the calluses and pain.      HPI: Patient is 65 y.o. female who presents today for pain in both feet due to calluses.  She states she has a drop foot and wonders if this could be contributing to the calluses forming.  She also relates pain in the middle of both feet.  A new concern of pain on the outside of both small toenails is also noted.  She states the right hallux nail is healed but still hurts her from time to time.    Allergies  Allergen Reactions   Other Anaphylaxis and Other (See Comments)    Do not use polyflux membrane.  Use alternate Other reaction(s): heart racing   Savella [Milnacipran Hcl] Palpitations and Other (See Comments)    Unknown   Tape Rash and Other (See Comments)    Itch- unsure if it was paper or adhesive tape    Review of systems is reviewed and negative.   Physical Exam  Patient is awake, alert, and oriented x 3.  In no acute distress.     Vascular status is intact with palpable pedal pulses 1/4 DP and non palpable PT bilateral and capillary refill time less than 3 seconds bilateral.  mild edema noted right greater than left.  No redness or swelling along the nail beds seen today.   Neurological exam reveals epicritic and protective sensation grossly intact bilateral.   Dermatological exam reveals skin is supple and dry to bilateral feet.  No open lesions present.  calluses present medial bunion left foot, submet 3/4 bilateral, medial side of right hallux.  She also has listers corns lateral fifth digits bilateral.  Some interdigital tinea appears to be present at the fourth interspace left foot   Musculoskeletal exam: Musculature intact with dorsiflexion, plantarflexion, inversion, eversion. First MPJ joint range of motion mildly decreased.  Significant bunion deformity is present left.  generalized discomfort of the  midfoot bilateral is noted.   Xrays:  2 views bilateral are noted.  Right foot has hardware from a bimalleolar ankle fracture noted.  Arthritic spurring at the talar-navicular joint is noted with midfoot spurring seen. Left foot has a large bunion deformity with lateral deviation of the second toe.  Adductovarus rotation of the fifth toes is seen.   Assessment:   ICD-10-CM   1. Bunion  M21.619 DG Foot 2 Views Right    DG Foot 2 Views Left    2. Corns and callosities  L84     3. Edema, unspecified type  R60.9     4. Arthritis of midtarsal joint of right foot  M19.071        Plan: Exam and x-ray findings are discussed with the patient.  I discussed that she does have some mild midfoot arthritis in the right foot which is expected due to her ankle fracture in the past.  Also discussed her dropfoot and she states that she already has a dropfoot brace for this right foot.  Recommended she try some capsaicin cream for the generalized discomfort she experiences in both of her feet as well as Lamisil cream for the fourth interspace of the left foot.  Discussed if the capsaicin cream does not help we can order her a compounded medication (consisting of diclofenac 3%, baclofen 2%, gabapentin 5%, lidocaine 5% and menthol 1%.)  I can order either  through [employer] or if she prefers to [employer].  She will call if she would like to try this medication in the future. Today I pared the calluses with a #15 blade x5.  I recommended she continue wearing a good supportive shoes.  She will be seen back in 2-3 months for continued care of calluses and foot care.

## 2020-07-17 DIAGNOSIS — Z992 Dependence on renal dialysis: Secondary | ICD-10-CM | POA: Diagnosis not present

## 2020-07-17 DIAGNOSIS — N186 End stage renal disease: Secondary | ICD-10-CM | POA: Diagnosis not present

## 2020-07-17 DIAGNOSIS — D509 Iron deficiency anemia, unspecified: Secondary | ICD-10-CM | POA: Diagnosis not present

## 2020-07-17 DIAGNOSIS — D631 Anemia in chronic kidney disease: Secondary | ICD-10-CM | POA: Diagnosis not present

## 2020-07-17 DIAGNOSIS — D696 Thrombocytopenia, unspecified: Secondary | ICD-10-CM | POA: Diagnosis not present

## 2020-07-17 DIAGNOSIS — D6959 Other secondary thrombocytopenia: Secondary | ICD-10-CM | POA: Diagnosis not present

## 2020-07-17 DIAGNOSIS — E876 Hypokalemia: Secondary | ICD-10-CM | POA: Diagnosis not present

## 2020-07-17 DIAGNOSIS — N2581 Secondary hyperparathyroidism of renal origin: Secondary | ICD-10-CM | POA: Diagnosis not present

## 2020-07-19 DIAGNOSIS — I270 Primary pulmonary hypertension: Secondary | ICD-10-CM | POA: Diagnosis not present

## 2020-07-19 DIAGNOSIS — D6959 Other secondary thrombocytopenia: Secondary | ICD-10-CM | POA: Diagnosis not present

## 2020-07-19 DIAGNOSIS — D631 Anemia in chronic kidney disease: Secondary | ICD-10-CM | POA: Diagnosis not present

## 2020-07-19 DIAGNOSIS — E876 Hypokalemia: Secondary | ICD-10-CM | POA: Diagnosis not present

## 2020-07-19 DIAGNOSIS — R0609 Other forms of dyspnea: Secondary | ICD-10-CM | POA: Diagnosis not present

## 2020-07-19 DIAGNOSIS — N186 End stage renal disease: Secondary | ICD-10-CM | POA: Diagnosis not present

## 2020-07-19 DIAGNOSIS — Z992 Dependence on renal dialysis: Secondary | ICD-10-CM | POA: Diagnosis not present

## 2020-07-19 DIAGNOSIS — D509 Iron deficiency anemia, unspecified: Secondary | ICD-10-CM | POA: Diagnosis not present

## 2020-07-19 DIAGNOSIS — D696 Thrombocytopenia, unspecified: Secondary | ICD-10-CM | POA: Diagnosis not present

## 2020-07-19 DIAGNOSIS — N2581 Secondary hyperparathyroidism of renal origin: Secondary | ICD-10-CM | POA: Diagnosis not present

## 2020-07-21 DIAGNOSIS — R040 Epistaxis: Secondary | ICD-10-CM | POA: Diagnosis not present

## 2020-07-21 DIAGNOSIS — J310 Chronic rhinitis: Secondary | ICD-10-CM | POA: Diagnosis not present

## 2020-07-21 DIAGNOSIS — I619 Nontraumatic intracerebral hemorrhage, unspecified: Secondary | ICD-10-CM | POA: Diagnosis not present

## 2020-07-22 DIAGNOSIS — Z992 Dependence on renal dialysis: Secondary | ICD-10-CM | POA: Diagnosis not present

## 2020-07-22 DIAGNOSIS — D696 Thrombocytopenia, unspecified: Secondary | ICD-10-CM | POA: Diagnosis not present

## 2020-07-22 DIAGNOSIS — D6959 Other secondary thrombocytopenia: Secondary | ICD-10-CM | POA: Diagnosis not present

## 2020-07-22 DIAGNOSIS — N186 End stage renal disease: Secondary | ICD-10-CM | POA: Diagnosis not present

## 2020-07-22 DIAGNOSIS — D509 Iron deficiency anemia, unspecified: Secondary | ICD-10-CM | POA: Diagnosis not present

## 2020-07-22 DIAGNOSIS — N2581 Secondary hyperparathyroidism of renal origin: Secondary | ICD-10-CM | POA: Diagnosis not present

## 2020-07-22 DIAGNOSIS — E876 Hypokalemia: Secondary | ICD-10-CM | POA: Diagnosis not present

## 2020-07-22 DIAGNOSIS — D631 Anemia in chronic kidney disease: Secondary | ICD-10-CM | POA: Diagnosis not present

## 2020-07-24 DIAGNOSIS — N186 End stage renal disease: Secondary | ICD-10-CM | POA: Diagnosis not present

## 2020-07-24 DIAGNOSIS — D6959 Other secondary thrombocytopenia: Secondary | ICD-10-CM | POA: Diagnosis not present

## 2020-07-24 DIAGNOSIS — D631 Anemia in chronic kidney disease: Secondary | ICD-10-CM | POA: Diagnosis not present

## 2020-07-24 DIAGNOSIS — D696 Thrombocytopenia, unspecified: Secondary | ICD-10-CM | POA: Diagnosis not present

## 2020-07-24 DIAGNOSIS — D509 Iron deficiency anemia, unspecified: Secondary | ICD-10-CM | POA: Diagnosis not present

## 2020-07-24 DIAGNOSIS — Z992 Dependence on renal dialysis: Secondary | ICD-10-CM | POA: Diagnosis not present

## 2020-07-24 DIAGNOSIS — N2581 Secondary hyperparathyroidism of renal origin: Secondary | ICD-10-CM | POA: Diagnosis not present

## 2020-07-24 DIAGNOSIS — E876 Hypokalemia: Secondary | ICD-10-CM | POA: Diagnosis not present

## 2020-07-26 DIAGNOSIS — Z992 Dependence on renal dialysis: Secondary | ICD-10-CM | POA: Diagnosis not present

## 2020-07-26 DIAGNOSIS — N2581 Secondary hyperparathyroidism of renal origin: Secondary | ICD-10-CM | POA: Diagnosis not present

## 2020-07-26 DIAGNOSIS — D696 Thrombocytopenia, unspecified: Secondary | ICD-10-CM | POA: Diagnosis not present

## 2020-07-26 DIAGNOSIS — D6959 Other secondary thrombocytopenia: Secondary | ICD-10-CM | POA: Diagnosis not present

## 2020-07-26 DIAGNOSIS — D631 Anemia in chronic kidney disease: Secondary | ICD-10-CM | POA: Diagnosis not present

## 2020-07-26 DIAGNOSIS — N186 End stage renal disease: Secondary | ICD-10-CM | POA: Diagnosis not present

## 2020-07-26 DIAGNOSIS — D509 Iron deficiency anemia, unspecified: Secondary | ICD-10-CM | POA: Diagnosis not present

## 2020-07-26 DIAGNOSIS — E876 Hypokalemia: Secondary | ICD-10-CM | POA: Diagnosis not present

## 2020-07-29 DIAGNOSIS — N2581 Secondary hyperparathyroidism of renal origin: Secondary | ICD-10-CM | POA: Diagnosis not present

## 2020-07-29 DIAGNOSIS — D509 Iron deficiency anemia, unspecified: Secondary | ICD-10-CM | POA: Diagnosis not present

## 2020-07-29 DIAGNOSIS — Z992 Dependence on renal dialysis: Secondary | ICD-10-CM | POA: Diagnosis not present

## 2020-07-29 DIAGNOSIS — D696 Thrombocytopenia, unspecified: Secondary | ICD-10-CM | POA: Diagnosis not present

## 2020-07-29 DIAGNOSIS — E876 Hypokalemia: Secondary | ICD-10-CM | POA: Diagnosis not present

## 2020-07-29 DIAGNOSIS — D631 Anemia in chronic kidney disease: Secondary | ICD-10-CM | POA: Diagnosis not present

## 2020-07-29 DIAGNOSIS — N186 End stage renal disease: Secondary | ICD-10-CM | POA: Diagnosis not present

## 2020-07-29 DIAGNOSIS — D6959 Other secondary thrombocytopenia: Secondary | ICD-10-CM | POA: Diagnosis not present

## 2020-07-30 ENCOUNTER — Ambulatory Visit: Payer: Medicare Other | Admitting: Physical Therapy

## 2020-07-31 DIAGNOSIS — Z992 Dependence on renal dialysis: Secondary | ICD-10-CM | POA: Diagnosis not present

## 2020-07-31 DIAGNOSIS — E876 Hypokalemia: Secondary | ICD-10-CM | POA: Diagnosis not present

## 2020-07-31 DIAGNOSIS — D696 Thrombocytopenia, unspecified: Secondary | ICD-10-CM | POA: Diagnosis not present

## 2020-07-31 DIAGNOSIS — N186 End stage renal disease: Secondary | ICD-10-CM | POA: Diagnosis not present

## 2020-07-31 DIAGNOSIS — N2581 Secondary hyperparathyroidism of renal origin: Secondary | ICD-10-CM | POA: Diagnosis not present

## 2020-07-31 DIAGNOSIS — D6959 Other secondary thrombocytopenia: Secondary | ICD-10-CM | POA: Diagnosis not present

## 2020-07-31 DIAGNOSIS — D509 Iron deficiency anemia, unspecified: Secondary | ICD-10-CM | POA: Diagnosis not present

## 2020-07-31 DIAGNOSIS — D631 Anemia in chronic kidney disease: Secondary | ICD-10-CM | POA: Diagnosis not present

## 2020-08-02 DIAGNOSIS — N2581 Secondary hyperparathyroidism of renal origin: Secondary | ICD-10-CM | POA: Diagnosis not present

## 2020-08-02 DIAGNOSIS — N186 End stage renal disease: Secondary | ICD-10-CM | POA: Diagnosis not present

## 2020-08-02 DIAGNOSIS — D509 Iron deficiency anemia, unspecified: Secondary | ICD-10-CM | POA: Diagnosis not present

## 2020-08-02 DIAGNOSIS — D631 Anemia in chronic kidney disease: Secondary | ICD-10-CM | POA: Diagnosis not present

## 2020-08-02 DIAGNOSIS — E876 Hypokalemia: Secondary | ICD-10-CM | POA: Diagnosis not present

## 2020-08-02 DIAGNOSIS — Z992 Dependence on renal dialysis: Secondary | ICD-10-CM | POA: Diagnosis not present

## 2020-08-02 DIAGNOSIS — D6959 Other secondary thrombocytopenia: Secondary | ICD-10-CM | POA: Diagnosis not present

## 2020-08-02 DIAGNOSIS — D696 Thrombocytopenia, unspecified: Secondary | ICD-10-CM | POA: Diagnosis not present

## 2020-08-04 ENCOUNTER — Ambulatory Visit (INDEPENDENT_AMBULATORY_CARE_PROVIDER_SITE_OTHER): Payer: Medicare Other | Admitting: Neurology

## 2020-08-04 ENCOUNTER — Encounter: Payer: Self-pay | Admitting: Neurology

## 2020-08-04 VITALS — BP 80/57 | HR 67 | Ht 65.0 in | Wt 126.0 lb

## 2020-08-04 DIAGNOSIS — Z8673 Personal history of transient ischemic attack (TIA), and cerebral infarction without residual deficits: Secondary | ICD-10-CM

## 2020-08-04 DIAGNOSIS — G8110 Spastic hemiplegia affecting unspecified side: Secondary | ICD-10-CM

## 2020-08-04 MED ORDER — ASPIRIN EC 81 MG PO TBEC: 81.0000 mg | DELAYED_RELEASE_TABLET | Freq: Every day | ORAL | 11 refills | Status: AC

## 2020-08-04 NOTE — Patient Instructions (Signed)
I had a long d/w patient and her husband about her recent hemorrhagic stroke, atrial fibrillation, right hemiparesis, risk for recurrent stroke/TIAs, personally independently reviewed imaging studies and stroke evaluation results and answered questions.I recommend she start taking aspirin 81 mg daily  for secondary stroke prevention and maintain strict control of hypertension with blood pressure goal below 130/90, diabetes with hemoglobin A1c goal below 6.5% and lipids with LDL cholesterol goal below 70 mg/dL. I also advised the patient to eat a healthy diet with plenty of whole grains, cereals, fruits and vegetables, exercise regularly and maintain ideal body weight .she was also advised to consider possibly the watchman device since she is scared of being on long-term anticoagulation given her history of intracerebral hemorrhage on warfarin.  She already has a cardiologist she sees at [facility] to talk to him about it.  Followup in the future with my nurse practitioner [name] in 3 months or call earlier if necessary.

## 2020-08-04 NOTE — Progress Notes (Signed)
[facility] [address] [place]. [place] [address] [phone]       OFFICE FOLLOW-UP NOTE  [name] Date of Birth:  1955-09-25 Medical Record Number:  [id number]   HPI:[name] is a 65 y.o. female with history of ESRD on HD TTS, fibromyalgia, hypertension, mixed connective tissue disorder, scleroderma, pulmonaryhypertension, atrial fibrillation, cirrhosis-on Coumadin for portal vein thrombosis and atrial fibrillation who presented to [facility] ED on 12/12/2019 with R sided weakness. Personally reviewed hospitalization pertinent progress notes, lab work and imaging with summary provided. Evaluated by Dr. Erlinda Hong with stroke work-up revealing left frontal IPH likely secondary to warfarin coagulopathy. Warfarin was reversed with vitamin K and Kcentra with INR of 1.7 on admission and recommended follow-up outpatient to consider resuming warfarin or consider Eliquis once ICH resolves. History of HTN stable. LDL 79 and recommend consideration of statin at discharge for follow-up in setting of hemorrhage. No history or evidence of DM with A1c 5.2. Other stroke risk factors include ESRD on HD and OSA. Other active problems include anemia of ESRD, metabolic bone disease, pulmonary hypertension, RA, scleroderma and fibromyalgia. Residual deficit of right hemiplegia. Evaluated by therapies and discharged to CIR on 12/18/2019 for ongoing therapy needs. Office visit 02/20/2020, [name] is being seen for hospital follow-up accompanied by her husband. She was discharged home from CIR on 01/23/2020. She has been doing well since discharge without new stroke/TIA symptoms and reports residual right-sided weakness and gait impairment with continued improvement.  Currently working with neuro rehab PT/OT.  She does report increased tone in RUE muscle tightness which is typically worse at night.  She was using Flexeril during CIR with benefit and questions if this could be restarted.   Ambulates with cane and denies any recent falls.  She is not using assistive device PTA but was using rolling walker initially at discharge.  She remains on atorvastatin 40 mg daily without myalgias.  Blood pressure today 133/72.  BP routinely monitored at dialysis.  No further concerns at this time. Update 08/04/2020 : She returns for follow-up after last visit with [name] our nurse practitioner 5 months ago.  She is doing well without recurrent stroke or TIA symptoms.  She continues to have right-sided weakness with is able to walk with a cane and has finished outpatient physical and Occupational Therapy.  She still has mild right foot drop.  She has had no falls or injuries.  She has not started taking aspirin or Eliquis and has questions about her treatment options.  She had a follow-up CT scan of the brain done on 03/25/2020 which showed satisfactory resolution of the left frontal parenchymal hematoma without any acute abnormalities.  She continues to have low blood pressure with the systolic in the 03E ever since her diagnosis and treatment for pulmonary hypertension.  Today it is 80/57 but she denies having significant symptoms of orthostasis, tiredness and fatigue. ROS:   14 system review of systems is positive for stiffness, weakness, gait difficulty, foot drop all other systems negative  PMH:  Past Medical History:  Diagnosis Date   Achalasia    Anxiety    Dysplasia of cervix, low grade (CIN 1)    Environmental allergies    "all year long" (12/27/2016)   ESRD (end stage renal disease) on dialysis ([place])    "TTS; [facility]" (12/27/2016)   Fibromyalgia    GERD (gastroesophageal reflux disease)    Gout    Hypertension    IBS (irritable bowel  syndrome)    MVP (mitral valve prolapse)    RA (rheumatoid arthritis) (HCC)    FOLLOWED BY DR. SHANAHAN   Raynaud's disease    Scleroderma ([place])    Seasonal allergies    Thrombocytopenia ([place]) 07/01/2016   Acute fall to 13,000 07/01/16   Tubular  adenoma 01/08/2008   CECUM   Vitamin D deficiency     Social History:  Social History   Socioeconomic History   Marital status: Married    Spouse name: Not on file   Number of children: Not on file   Years of education: Not on file   Highest education level: Not on file  Occupational History   Not on file  Tobacco Use   Smoking status: Never   Smokeless tobacco: Never  Vaping Use   Vaping Use: Never used  Substance and Sexual Activity   Alcohol use: No   Drug use: No   Sexual activity: Not Currently    Birth control/protection: Post-menopausal  Other Topics Concern   Not on file  Social History Narrative   Lives in [place] Determinants of Health   Financial Resource Strain: Not on file  Food Insecurity: Not on file  Transportation Needs: Not on file  Physical Activity: Not on file  Stress: Not on file  Social Connections: Not on file  Intimate Partner Violence: Not on file    Medications:   Current Outpatient Medications on File Prior to Visit  Medication Sig Dispense Refill   ambrisentan (LETAIRIS) 5 MG tablet Take 5 mg by mouth daily.     atorvastatin (LIPITOR) 40 MG tablet Take 1 tablet (40 mg total) by mouth daily. 90 tablet 3   azelastine (ASTELIN) 0.1 % nasal spray Place 1 spray into both nostrils 2 (two) times daily as needed for rhinitis. Use in each nostril as directed 30 mL 12   calcium acetate (PHOSLO) 667 MG capsule Take by mouth.     camphor-menthol (SARNA) lotion Apply topically 2 (two) times daily. 222 mL 0   clonazePAM (KLONOPIN) 0.5 MG tablet Take 1 tablet (0.5 mg total) by mouth 2 (two) times daily as needed for anxiety. 30 tablet 0   cyclobenzaprine (FLEXERIL) 5 MG tablet Take 1 tablet (5 mg total) by mouth every 8 (eight) hours as needed for muscle spasms. 60 tablet 4   docusate sodium (COLACE) 100 MG capsule Take 2 capsules (200 mg total) by mouth daily. 60 capsule 0   Docusate Sodium (DSS) 100 MG CAPS Take by mouth.      famotidine (PEPCID) 20 MG tablet Take 1 tablet (20 mg total) by mouth daily as needed for heartburn or indigestion. 30 tablet 0   gabapentin (NEURONTIN) 100 MG capsule Take 1 capsule (100 mg total) by mouth 2 (two) times daily as needed. 60 capsule 0   hydrocerin (EUCERIN) CREA Apply 1 application topically 2 (two) times daily. To dry skin--avoid IV site 454 g 0   hydrocortisone (ANUSOL-HC) 2.5 % rectal cream Place rectally 2 (two) times daily as needed for hemorrhoids or anal itching. 30 g 0   hydrOXYzine (ATARAX/VISTARIL) 25 MG tablet Take 25 mg by mouth every 8 (eight) hours as needed for itching.      iron sucrose in sodium chloride 0.9 % 100 mL Iron Sucrose (Venofer)     levocetirizine (XYZAL) 5 MG tablet SMARTSIG:1 Tablet(s) By Mouth Every Evening     lidocaine-prilocaine (EMLA) cream Apply 1 application topically as needed. Apply to toes as needed  for pain 30 g 3   lubiprostone (AMITIZA) 8 MCG capsule Take 8 mcg by mouth 2 (two) times daily with a meal.     macitentan (OPSUMIT) 10 MG tablet Take 10 mg by mouth daily.     menthol-cetylpyridinium (CEPACOL) 3 MG lozenge Take 1 lozenge (3 mg total) by mouth as needed for sore throat. 100 tablet 12   Methoxy PEG-Epoetin Beta (MIRCERA IJ) Mircera     multivitamin (RENA-VIT) TABS tablet Take 1 tablet by mouth daily at 6 (six) AM. 30 tablet 0   oxymetazoline (AFRIN) 0.05 % nasal spray Place 1 spray into both nostrils 2 (two) times daily as needed for congestion. 30 mL 0   pentoxifylline (TRENTAL) 400 MG CR tablet Take 1 tablet (400 mg total) by mouth daily. 30 tablet 0   psyllium (HYDROCIL/METAMUCIL) 95 % PACK Take 1 packet by mouth 2 (two) times daily. 240 each    Selexipag (UPTRAVI) 800 MCG TABS Take 1 tablet (800 mcg total) by mouth in the morning and at bedtime. 60 tablet 0   sodium chloride (OCEAN) 0.65 % SOLN nasal spray Place 1 spray into both nostrils 5 (five) times daily. At least 5 times a day  0   No current facility-administered  medications on file prior to visit.    Allergies:   Allergies  Allergen Reactions   Other Anaphylaxis and Other (See Comments)    Do not use polyflux membrane.  Use alternate Other reaction(s): heart racing   Savella [Milnacipran Hcl] Palpitations and Other (See Comments)    Unknown   Tape Rash and Other (See Comments)    Itch- unsure if it was paper or adhesive tape    Physical Exam General: Frail middle-aged African-American lady, seated, in no evident distress Head: head normocephalic and atraumatic.  Neck: supple with no carotid or supraclavicular bruits Cardiovascular: regular rate and rhythm, no murmurs Musculoskeletal: no deformity Skin:  no rash/petichiae Vascular:  Normal pulses all extremities Vitals:   08/04/20 0851  BP: (!) 80/57  Pulse: 67   Neurologic Exam Mental Status: Awake and fully alert. Oriented to place and time. Recent and remote memory intact. Attention span, concentration and fund of knowledge appropriate. Mood and affect appropriate.  Cranial Nerves: Fundoscopic exam not done. Pupils equal, briskly reactive to light. Extraocular movements full without nystagmus. Visual fields full to confrontation. Hearing intact. Facial sensation intact. Face, tongue, palate moves normally and symmetrically.  Motor: Spastic right hemiparesis with right upper extremity 3/5 strength with weakness of grip and using hand muscles and orbits left to right upper extremity.  Right foot drop.  Tone is increased on the right compared to the left.  Normal strength on the left side.. Sensory.: intact to touch ,pinprick .position and vibratory sensation.  Coordination: Rapid alternating movements normal in all extremities. Finger-to-nose and heel-to-shin performed accurately bilaterally. Gait and Station: Arises from chair without difficulty. Stance is normal. Gait demonstrates  hemiplegic gait with decreased RLE step height and mild unsteadiness with use of cane Reflexes: 2+ on the  right and 1+ on the left.  Toes downgoing.   NIHSS  1 Modified Rankin  3   ASSESSMENT: 65 year old African-American lady with left frontal parenchymal hemorrhage in December 2021 secondary to warfarin coagulopathy for atrial fibrillation.  She is doing well except for mild right sided spastic hemiplegia.     PLAN: I had a long d/w patient and her husband about her recent hemorrhagic stroke, atrial fibrillation, right hemiparesis, risk for recurrent stroke/TIAs, personally  independently reviewed imaging studies and stroke evaluation results and answered questions.I recommend she start taking aspirin 81 mg daily  for secondary stroke prevention and maintain strict control of hypertension with blood pressure goal below 130/90, diabetes with hemoglobin A1c goal below 6.5% and lipids with LDL cholesterol goal below 70 mg/dL. I also advised the patient to eat a healthy diet with plenty of whole grains, cereals, fruits and vegetables, exercise regularly and maintain ideal body weight .she was also advised to consider possibly the watchman device since she is scared of being on long-term anticoagulation given her history of intracerebral hemorrhage on warfarin.  She already has a cardiologist she sees at [facility] to talk to him about it.  Followup in the future with my nurse practitioner [name] in 3 months or call earlier if necessary. Greater than 50% of time during this 35 minute visit was spent on counseling,explanation of diagnosis atrial fibrillation and stroke risk and anticoagulation related bleeding and treatment options, planning of further management, discussion with patient and family and coordination of care Antony Contras, MD Note: This document was prepared with digital dictation and possible smart phrase technology. Any transcriptional errors that result from this process are unintentional

## 2020-08-05 DIAGNOSIS — Z992 Dependence on renal dialysis: Secondary | ICD-10-CM | POA: Diagnosis not present

## 2020-08-05 DIAGNOSIS — D696 Thrombocytopenia, unspecified: Secondary | ICD-10-CM | POA: Diagnosis not present

## 2020-08-05 DIAGNOSIS — D6959 Other secondary thrombocytopenia: Secondary | ICD-10-CM | POA: Diagnosis not present

## 2020-08-05 DIAGNOSIS — E876 Hypokalemia: Secondary | ICD-10-CM | POA: Diagnosis not present

## 2020-08-05 DIAGNOSIS — D631 Anemia in chronic kidney disease: Secondary | ICD-10-CM | POA: Diagnosis not present

## 2020-08-05 DIAGNOSIS — N2581 Secondary hyperparathyroidism of renal origin: Secondary | ICD-10-CM | POA: Diagnosis not present

## 2020-08-05 DIAGNOSIS — N186 End stage renal disease: Secondary | ICD-10-CM | POA: Diagnosis not present

## 2020-08-05 DIAGNOSIS — D509 Iron deficiency anemia, unspecified: Secondary | ICD-10-CM | POA: Diagnosis not present

## 2020-08-07 DIAGNOSIS — Z992 Dependence on renal dialysis: Secondary | ICD-10-CM | POA: Diagnosis not present

## 2020-08-07 DIAGNOSIS — N186 End stage renal disease: Secondary | ICD-10-CM | POA: Diagnosis not present

## 2020-08-07 DIAGNOSIS — D509 Iron deficiency anemia, unspecified: Secondary | ICD-10-CM | POA: Diagnosis not present

## 2020-08-07 DIAGNOSIS — E876 Hypokalemia: Secondary | ICD-10-CM | POA: Diagnosis not present

## 2020-08-07 DIAGNOSIS — D631 Anemia in chronic kidney disease: Secondary | ICD-10-CM | POA: Diagnosis not present

## 2020-08-07 DIAGNOSIS — N2581 Secondary hyperparathyroidism of renal origin: Secondary | ICD-10-CM | POA: Diagnosis not present

## 2020-08-07 DIAGNOSIS — D696 Thrombocytopenia, unspecified: Secondary | ICD-10-CM | POA: Diagnosis not present

## 2020-08-07 DIAGNOSIS — D6959 Other secondary thrombocytopenia: Secondary | ICD-10-CM | POA: Diagnosis not present

## 2020-08-09 DIAGNOSIS — E876 Hypokalemia: Secondary | ICD-10-CM | POA: Diagnosis not present

## 2020-08-09 DIAGNOSIS — N186 End stage renal disease: Secondary | ICD-10-CM | POA: Diagnosis not present

## 2020-08-09 DIAGNOSIS — D696 Thrombocytopenia, unspecified: Secondary | ICD-10-CM | POA: Diagnosis not present

## 2020-08-09 DIAGNOSIS — D6959 Other secondary thrombocytopenia: Secondary | ICD-10-CM | POA: Diagnosis not present

## 2020-08-09 DIAGNOSIS — N2581 Secondary hyperparathyroidism of renal origin: Secondary | ICD-10-CM | POA: Diagnosis not present

## 2020-08-09 DIAGNOSIS — Z992 Dependence on renal dialysis: Secondary | ICD-10-CM | POA: Diagnosis not present

## 2020-08-09 DIAGNOSIS — D509 Iron deficiency anemia, unspecified: Secondary | ICD-10-CM | POA: Diagnosis not present

## 2020-08-09 DIAGNOSIS — D631 Anemia in chronic kidney disease: Secondary | ICD-10-CM | POA: Diagnosis not present

## 2020-08-10 DIAGNOSIS — Z992 Dependence on renal dialysis: Secondary | ICD-10-CM | POA: Diagnosis not present

## 2020-08-10 DIAGNOSIS — N186 End stage renal disease: Secondary | ICD-10-CM | POA: Diagnosis not present

## 2020-08-10 DIAGNOSIS — N269 Renal sclerosis, unspecified: Secondary | ICD-10-CM | POA: Diagnosis not present

## 2020-08-12 DIAGNOSIS — D509 Iron deficiency anemia, unspecified: Secondary | ICD-10-CM | POA: Diagnosis not present

## 2020-08-12 DIAGNOSIS — N2581 Secondary hyperparathyroidism of renal origin: Secondary | ICD-10-CM | POA: Diagnosis not present

## 2020-08-12 DIAGNOSIS — D631 Anemia in chronic kidney disease: Secondary | ICD-10-CM | POA: Diagnosis not present

## 2020-08-12 DIAGNOSIS — Z992 Dependence on renal dialysis: Secondary | ICD-10-CM | POA: Diagnosis not present

## 2020-08-12 DIAGNOSIS — N186 End stage renal disease: Secondary | ICD-10-CM | POA: Diagnosis not present

## 2020-08-12 DIAGNOSIS — E785 Hyperlipidemia, unspecified: Secondary | ICD-10-CM | POA: Diagnosis not present

## 2020-08-13 DIAGNOSIS — I4892 Unspecified atrial flutter: Secondary | ICD-10-CM | POA: Diagnosis not present

## 2020-08-13 DIAGNOSIS — I519 Heart disease, unspecified: Secondary | ICD-10-CM | POA: Insufficient documentation

## 2020-08-13 DIAGNOSIS — Z8673 Personal history of transient ischemic attack (TIA), and cerebral infarction without residual deficits: Secondary | ICD-10-CM | POA: Diagnosis not present

## 2020-08-13 DIAGNOSIS — G8191 Hemiplegia, unspecified affecting right dominant side: Secondary | ICD-10-CM | POA: Diagnosis not present

## 2020-08-13 DIAGNOSIS — M349 Systemic sclerosis, unspecified: Secondary | ICD-10-CM | POA: Diagnosis not present

## 2020-08-13 DIAGNOSIS — K5900 Constipation, unspecified: Secondary | ICD-10-CM | POA: Diagnosis not present

## 2020-08-13 DIAGNOSIS — R0609 Other forms of dyspnea: Secondary | ICD-10-CM | POA: Diagnosis not present

## 2020-08-13 DIAGNOSIS — M359 Systemic involvement of connective tissue, unspecified: Secondary | ICD-10-CM | POA: Diagnosis not present

## 2020-08-13 DIAGNOSIS — I509 Heart failure, unspecified: Secondary | ICD-10-CM | POA: Diagnosis not present

## 2020-08-13 DIAGNOSIS — N186 End stage renal disease: Secondary | ICD-10-CM | POA: Diagnosis not present

## 2020-08-13 DIAGNOSIS — Z992 Dependence on renal dialysis: Secondary | ICD-10-CM | POA: Diagnosis not present

## 2020-08-13 DIAGNOSIS — I2721 Secondary pulmonary arterial hypertension: Secondary | ICD-10-CM | POA: Diagnosis not present

## 2020-08-14 DIAGNOSIS — E785 Hyperlipidemia, unspecified: Secondary | ICD-10-CM | POA: Diagnosis not present

## 2020-08-14 DIAGNOSIS — Z992 Dependence on renal dialysis: Secondary | ICD-10-CM | POA: Diagnosis not present

## 2020-08-14 DIAGNOSIS — D631 Anemia in chronic kidney disease: Secondary | ICD-10-CM | POA: Diagnosis not present

## 2020-08-14 DIAGNOSIS — D509 Iron deficiency anemia, unspecified: Secondary | ICD-10-CM | POA: Diagnosis not present

## 2020-08-14 DIAGNOSIS — N2581 Secondary hyperparathyroidism of renal origin: Secondary | ICD-10-CM | POA: Diagnosis not present

## 2020-08-14 DIAGNOSIS — N186 End stage renal disease: Secondary | ICD-10-CM | POA: Diagnosis not present

## 2020-08-16 DIAGNOSIS — N2581 Secondary hyperparathyroidism of renal origin: Secondary | ICD-10-CM | POA: Diagnosis not present

## 2020-08-16 DIAGNOSIS — D631 Anemia in chronic kidney disease: Secondary | ICD-10-CM | POA: Diagnosis not present

## 2020-08-16 DIAGNOSIS — D509 Iron deficiency anemia, unspecified: Secondary | ICD-10-CM | POA: Diagnosis not present

## 2020-08-16 DIAGNOSIS — N186 End stage renal disease: Secondary | ICD-10-CM | POA: Diagnosis not present

## 2020-08-16 DIAGNOSIS — E785 Hyperlipidemia, unspecified: Secondary | ICD-10-CM | POA: Diagnosis not present

## 2020-08-16 DIAGNOSIS — Z992 Dependence on renal dialysis: Secondary | ICD-10-CM | POA: Diagnosis not present

## 2020-08-19 ENCOUNTER — Ambulatory Visit: Payer: Self-pay | Admitting: Neurology

## 2020-08-19 DIAGNOSIS — N2581 Secondary hyperparathyroidism of renal origin: Secondary | ICD-10-CM | POA: Diagnosis not present

## 2020-08-19 DIAGNOSIS — E785 Hyperlipidemia, unspecified: Secondary | ICD-10-CM | POA: Diagnosis not present

## 2020-08-19 DIAGNOSIS — I270 Primary pulmonary hypertension: Secondary | ICD-10-CM | POA: Diagnosis not present

## 2020-08-19 DIAGNOSIS — R0609 Other forms of dyspnea: Secondary | ICD-10-CM | POA: Diagnosis not present

## 2020-08-19 DIAGNOSIS — D509 Iron deficiency anemia, unspecified: Secondary | ICD-10-CM | POA: Diagnosis not present

## 2020-08-19 DIAGNOSIS — N186 End stage renal disease: Secondary | ICD-10-CM | POA: Diagnosis not present

## 2020-08-19 DIAGNOSIS — Z992 Dependence on renal dialysis: Secondary | ICD-10-CM | POA: Diagnosis not present

## 2020-08-19 DIAGNOSIS — D631 Anemia in chronic kidney disease: Secondary | ICD-10-CM | POA: Diagnosis not present

## 2020-08-21 DIAGNOSIS — D631 Anemia in chronic kidney disease: Secondary | ICD-10-CM | POA: Diagnosis not present

## 2020-08-21 DIAGNOSIS — N186 End stage renal disease: Secondary | ICD-10-CM | POA: Diagnosis not present

## 2020-08-21 DIAGNOSIS — I619 Nontraumatic intracerebral hemorrhage, unspecified: Secondary | ICD-10-CM | POA: Diagnosis not present

## 2020-08-21 DIAGNOSIS — E785 Hyperlipidemia, unspecified: Secondary | ICD-10-CM | POA: Diagnosis not present

## 2020-08-21 DIAGNOSIS — N2581 Secondary hyperparathyroidism of renal origin: Secondary | ICD-10-CM | POA: Diagnosis not present

## 2020-08-21 DIAGNOSIS — D509 Iron deficiency anemia, unspecified: Secondary | ICD-10-CM | POA: Diagnosis not present

## 2020-08-21 DIAGNOSIS — Z992 Dependence on renal dialysis: Secondary | ICD-10-CM | POA: Diagnosis not present

## 2020-08-23 DIAGNOSIS — N186 End stage renal disease: Secondary | ICD-10-CM | POA: Diagnosis not present

## 2020-08-23 DIAGNOSIS — Z992 Dependence on renal dialysis: Secondary | ICD-10-CM | POA: Diagnosis not present

## 2020-08-23 DIAGNOSIS — D631 Anemia in chronic kidney disease: Secondary | ICD-10-CM | POA: Diagnosis not present

## 2020-08-23 DIAGNOSIS — N2581 Secondary hyperparathyroidism of renal origin: Secondary | ICD-10-CM | POA: Diagnosis not present

## 2020-08-23 DIAGNOSIS — E785 Hyperlipidemia, unspecified: Secondary | ICD-10-CM | POA: Diagnosis not present

## 2020-08-23 DIAGNOSIS — D509 Iron deficiency anemia, unspecified: Secondary | ICD-10-CM | POA: Diagnosis not present

## 2020-08-26 DIAGNOSIS — Z992 Dependence on renal dialysis: Secondary | ICD-10-CM | POA: Diagnosis not present

## 2020-08-26 DIAGNOSIS — N2581 Secondary hyperparathyroidism of renal origin: Secondary | ICD-10-CM | POA: Diagnosis not present

## 2020-08-26 DIAGNOSIS — E785 Hyperlipidemia, unspecified: Secondary | ICD-10-CM | POA: Diagnosis not present

## 2020-08-26 DIAGNOSIS — D509 Iron deficiency anemia, unspecified: Secondary | ICD-10-CM | POA: Diagnosis not present

## 2020-08-26 DIAGNOSIS — D631 Anemia in chronic kidney disease: Secondary | ICD-10-CM | POA: Diagnosis not present

## 2020-08-26 DIAGNOSIS — N186 End stage renal disease: Secondary | ICD-10-CM | POA: Diagnosis not present

## 2020-08-28 DIAGNOSIS — Z992 Dependence on renal dialysis: Secondary | ICD-10-CM | POA: Diagnosis not present

## 2020-08-28 DIAGNOSIS — D631 Anemia in chronic kidney disease: Secondary | ICD-10-CM | POA: Diagnosis not present

## 2020-08-28 DIAGNOSIS — N2581 Secondary hyperparathyroidism of renal origin: Secondary | ICD-10-CM | POA: Diagnosis not present

## 2020-08-28 DIAGNOSIS — N186 End stage renal disease: Secondary | ICD-10-CM | POA: Diagnosis not present

## 2020-08-28 DIAGNOSIS — E785 Hyperlipidemia, unspecified: Secondary | ICD-10-CM | POA: Diagnosis not present

## 2020-08-28 DIAGNOSIS — D509 Iron deficiency anemia, unspecified: Secondary | ICD-10-CM | POA: Diagnosis not present

## 2020-08-30 DIAGNOSIS — D631 Anemia in chronic kidney disease: Secondary | ICD-10-CM | POA: Diagnosis not present

## 2020-08-30 DIAGNOSIS — N186 End stage renal disease: Secondary | ICD-10-CM | POA: Diagnosis not present

## 2020-08-30 DIAGNOSIS — D509 Iron deficiency anemia, unspecified: Secondary | ICD-10-CM | POA: Diagnosis not present

## 2020-08-30 DIAGNOSIS — N2581 Secondary hyperparathyroidism of renal origin: Secondary | ICD-10-CM | POA: Diagnosis not present

## 2020-08-30 DIAGNOSIS — E785 Hyperlipidemia, unspecified: Secondary | ICD-10-CM | POA: Diagnosis not present

## 2020-08-30 DIAGNOSIS — Z992 Dependence on renal dialysis: Secondary | ICD-10-CM | POA: Diagnosis not present

## 2020-09-02 DIAGNOSIS — N186 End stage renal disease: Secondary | ICD-10-CM | POA: Diagnosis not present

## 2020-09-02 DIAGNOSIS — D509 Iron deficiency anemia, unspecified: Secondary | ICD-10-CM | POA: Diagnosis not present

## 2020-09-02 DIAGNOSIS — Z992 Dependence on renal dialysis: Secondary | ICD-10-CM | POA: Diagnosis not present

## 2020-09-02 DIAGNOSIS — D631 Anemia in chronic kidney disease: Secondary | ICD-10-CM | POA: Diagnosis not present

## 2020-09-02 DIAGNOSIS — N2581 Secondary hyperparathyroidism of renal origin: Secondary | ICD-10-CM | POA: Diagnosis not present

## 2020-09-02 DIAGNOSIS — E785 Hyperlipidemia, unspecified: Secondary | ICD-10-CM | POA: Diagnosis not present

## 2020-09-03 DIAGNOSIS — Z0289 Encounter for other administrative examinations: Secondary | ICD-10-CM

## 2020-09-04 ENCOUNTER — Telehealth: Payer: Self-pay | Admitting: *Deleted

## 2020-09-04 DIAGNOSIS — E785 Hyperlipidemia, unspecified: Secondary | ICD-10-CM | POA: Diagnosis not present

## 2020-09-04 DIAGNOSIS — Z992 Dependence on renal dialysis: Secondary | ICD-10-CM | POA: Diagnosis not present

## 2020-09-04 DIAGNOSIS — N186 End stage renal disease: Secondary | ICD-10-CM | POA: Diagnosis not present

## 2020-09-04 DIAGNOSIS — N2581 Secondary hyperparathyroidism of renal origin: Secondary | ICD-10-CM | POA: Diagnosis not present

## 2020-09-04 DIAGNOSIS — D631 Anemia in chronic kidney disease: Secondary | ICD-10-CM | POA: Diagnosis not present

## 2020-09-04 DIAGNOSIS — D509 Iron deficiency anemia, unspecified: Secondary | ICD-10-CM | POA: Diagnosis not present

## 2020-09-04 NOTE — Telephone Encounter (Signed)
I faxed pt [employer] form on 09/04/[phone]

## 2020-09-06 DIAGNOSIS — N186 End stage renal disease: Secondary | ICD-10-CM | POA: Diagnosis not present

## 2020-09-06 DIAGNOSIS — D631 Anemia in chronic kidney disease: Secondary | ICD-10-CM | POA: Diagnosis not present

## 2020-09-06 DIAGNOSIS — N2581 Secondary hyperparathyroidism of renal origin: Secondary | ICD-10-CM | POA: Diagnosis not present

## 2020-09-06 DIAGNOSIS — D509 Iron deficiency anemia, unspecified: Secondary | ICD-10-CM | POA: Diagnosis not present

## 2020-09-06 DIAGNOSIS — Z992 Dependence on renal dialysis: Secondary | ICD-10-CM | POA: Diagnosis not present

## 2020-09-06 DIAGNOSIS — E785 Hyperlipidemia, unspecified: Secondary | ICD-10-CM | POA: Diagnosis not present

## 2020-09-09 DIAGNOSIS — E785 Hyperlipidemia, unspecified: Secondary | ICD-10-CM | POA: Diagnosis not present

## 2020-09-09 DIAGNOSIS — N2581 Secondary hyperparathyroidism of renal origin: Secondary | ICD-10-CM | POA: Diagnosis not present

## 2020-09-09 DIAGNOSIS — N186 End stage renal disease: Secondary | ICD-10-CM | POA: Diagnosis not present

## 2020-09-09 DIAGNOSIS — D509 Iron deficiency anemia, unspecified: Secondary | ICD-10-CM | POA: Diagnosis not present

## 2020-09-09 DIAGNOSIS — D631 Anemia in chronic kidney disease: Secondary | ICD-10-CM | POA: Diagnosis not present

## 2020-09-09 DIAGNOSIS — Z992 Dependence on renal dialysis: Secondary | ICD-10-CM | POA: Diagnosis not present

## 2020-09-10 DIAGNOSIS — Z992 Dependence on renal dialysis: Secondary | ICD-10-CM | POA: Diagnosis not present

## 2020-09-10 DIAGNOSIS — N186 End stage renal disease: Secondary | ICD-10-CM | POA: Diagnosis not present

## 2020-09-10 DIAGNOSIS — N269 Renal sclerosis, unspecified: Secondary | ICD-10-CM | POA: Diagnosis not present

## 2020-09-11 DIAGNOSIS — D509 Iron deficiency anemia, unspecified: Secondary | ICD-10-CM | POA: Diagnosis not present

## 2020-09-11 DIAGNOSIS — N186 End stage renal disease: Secondary | ICD-10-CM | POA: Diagnosis not present

## 2020-09-11 DIAGNOSIS — N2581 Secondary hyperparathyroidism of renal origin: Secondary | ICD-10-CM | POA: Diagnosis not present

## 2020-09-11 DIAGNOSIS — T782XXA Anaphylactic shock, unspecified, initial encounter: Secondary | ICD-10-CM | POA: Diagnosis not present

## 2020-09-11 DIAGNOSIS — Z992 Dependence on renal dialysis: Secondary | ICD-10-CM | POA: Diagnosis not present

## 2020-09-11 DIAGNOSIS — D631 Anemia in chronic kidney disease: Secondary | ICD-10-CM | POA: Diagnosis not present

## 2020-09-13 DIAGNOSIS — Z992 Dependence on renal dialysis: Secondary | ICD-10-CM | POA: Diagnosis not present

## 2020-09-13 DIAGNOSIS — T782XXA Anaphylactic shock, unspecified, initial encounter: Secondary | ICD-10-CM | POA: Diagnosis not present

## 2020-09-13 DIAGNOSIS — D509 Iron deficiency anemia, unspecified: Secondary | ICD-10-CM | POA: Diagnosis not present

## 2020-09-13 DIAGNOSIS — N2581 Secondary hyperparathyroidism of renal origin: Secondary | ICD-10-CM | POA: Diagnosis not present

## 2020-09-13 DIAGNOSIS — N186 End stage renal disease: Secondary | ICD-10-CM | POA: Diagnosis not present

## 2020-09-13 DIAGNOSIS — D631 Anemia in chronic kidney disease: Secondary | ICD-10-CM | POA: Diagnosis not present

## 2020-09-16 DIAGNOSIS — D509 Iron deficiency anemia, unspecified: Secondary | ICD-10-CM | POA: Diagnosis not present

## 2020-09-16 DIAGNOSIS — N186 End stage renal disease: Secondary | ICD-10-CM | POA: Diagnosis not present

## 2020-09-16 DIAGNOSIS — N2581 Secondary hyperparathyroidism of renal origin: Secondary | ICD-10-CM | POA: Diagnosis not present

## 2020-09-16 DIAGNOSIS — D631 Anemia in chronic kidney disease: Secondary | ICD-10-CM | POA: Diagnosis not present

## 2020-09-16 DIAGNOSIS — T782XXA Anaphylactic shock, unspecified, initial encounter: Secondary | ICD-10-CM | POA: Diagnosis not present

## 2020-09-16 DIAGNOSIS — Z992 Dependence on renal dialysis: Secondary | ICD-10-CM | POA: Diagnosis not present

## 2020-09-17 DIAGNOSIS — I2721 Secondary pulmonary arterial hypertension: Secondary | ICD-10-CM | POA: Diagnosis not present

## 2020-09-17 DIAGNOSIS — Z79899 Other long term (current) drug therapy: Secondary | ICD-10-CM | POA: Diagnosis not present

## 2020-09-17 DIAGNOSIS — I5089 Other heart failure: Secondary | ICD-10-CM | POA: Diagnosis not present

## 2020-09-17 DIAGNOSIS — M3489 Other systemic sclerosis: Secondary | ICD-10-CM | POA: Diagnosis not present

## 2020-09-17 DIAGNOSIS — J849 Interstitial pulmonary disease, unspecified: Secondary | ICD-10-CM | POA: Diagnosis not present

## 2020-09-17 DIAGNOSIS — M0609 Rheumatoid arthritis without rheumatoid factor, multiple sites: Secondary | ICD-10-CM | POA: Diagnosis not present

## 2020-09-18 DIAGNOSIS — D509 Iron deficiency anemia, unspecified: Secondary | ICD-10-CM | POA: Diagnosis not present

## 2020-09-18 DIAGNOSIS — Z992 Dependence on renal dialysis: Secondary | ICD-10-CM | POA: Diagnosis not present

## 2020-09-18 DIAGNOSIS — D631 Anemia in chronic kidney disease: Secondary | ICD-10-CM | POA: Diagnosis not present

## 2020-09-18 DIAGNOSIS — T782XXA Anaphylactic shock, unspecified, initial encounter: Secondary | ICD-10-CM | POA: Diagnosis not present

## 2020-09-18 DIAGNOSIS — N186 End stage renal disease: Secondary | ICD-10-CM | POA: Diagnosis not present

## 2020-09-18 DIAGNOSIS — N2581 Secondary hyperparathyroidism of renal origin: Secondary | ICD-10-CM | POA: Diagnosis not present

## 2020-09-19 DIAGNOSIS — R0609 Other forms of dyspnea: Secondary | ICD-10-CM | POA: Diagnosis not present

## 2020-09-19 DIAGNOSIS — I270 Primary pulmonary hypertension: Secondary | ICD-10-CM | POA: Diagnosis not present

## 2020-09-19 DIAGNOSIS — N186 End stage renal disease: Secondary | ICD-10-CM | POA: Diagnosis not present

## 2020-09-20 DIAGNOSIS — T782XXA Anaphylactic shock, unspecified, initial encounter: Secondary | ICD-10-CM | POA: Diagnosis not present

## 2020-09-20 DIAGNOSIS — Z992 Dependence on renal dialysis: Secondary | ICD-10-CM | POA: Diagnosis not present

## 2020-09-20 DIAGNOSIS — D509 Iron deficiency anemia, unspecified: Secondary | ICD-10-CM | POA: Diagnosis not present

## 2020-09-20 DIAGNOSIS — N2581 Secondary hyperparathyroidism of renal origin: Secondary | ICD-10-CM | POA: Diagnosis not present

## 2020-09-20 DIAGNOSIS — N186 End stage renal disease: Secondary | ICD-10-CM | POA: Diagnosis not present

## 2020-09-20 DIAGNOSIS — D631 Anemia in chronic kidney disease: Secondary | ICD-10-CM | POA: Diagnosis not present

## 2020-09-21 DIAGNOSIS — I619 Nontraumatic intracerebral hemorrhage, unspecified: Secondary | ICD-10-CM | POA: Diagnosis not present

## 2020-09-23 DIAGNOSIS — D509 Iron deficiency anemia, unspecified: Secondary | ICD-10-CM | POA: Diagnosis not present

## 2020-09-23 DIAGNOSIS — D631 Anemia in chronic kidney disease: Secondary | ICD-10-CM | POA: Diagnosis not present

## 2020-09-23 DIAGNOSIS — N2581 Secondary hyperparathyroidism of renal origin: Secondary | ICD-10-CM | POA: Diagnosis not present

## 2020-09-23 DIAGNOSIS — N186 End stage renal disease: Secondary | ICD-10-CM | POA: Diagnosis not present

## 2020-09-23 DIAGNOSIS — Z992 Dependence on renal dialysis: Secondary | ICD-10-CM | POA: Diagnosis not present

## 2020-09-23 DIAGNOSIS — T782XXA Anaphylactic shock, unspecified, initial encounter: Secondary | ICD-10-CM | POA: Diagnosis not present

## 2020-09-25 DIAGNOSIS — D509 Iron deficiency anemia, unspecified: Secondary | ICD-10-CM | POA: Diagnosis not present

## 2020-09-25 DIAGNOSIS — N186 End stage renal disease: Secondary | ICD-10-CM | POA: Diagnosis not present

## 2020-09-25 DIAGNOSIS — T782XXA Anaphylactic shock, unspecified, initial encounter: Secondary | ICD-10-CM | POA: Diagnosis not present

## 2020-09-25 DIAGNOSIS — Z992 Dependence on renal dialysis: Secondary | ICD-10-CM | POA: Diagnosis not present

## 2020-09-25 DIAGNOSIS — D631 Anemia in chronic kidney disease: Secondary | ICD-10-CM | POA: Diagnosis not present

## 2020-09-25 DIAGNOSIS — N2581 Secondary hyperparathyroidism of renal origin: Secondary | ICD-10-CM | POA: Diagnosis not present

## 2020-09-27 DIAGNOSIS — D509 Iron deficiency anemia, unspecified: Secondary | ICD-10-CM | POA: Diagnosis not present

## 2020-09-27 DIAGNOSIS — N2581 Secondary hyperparathyroidism of renal origin: Secondary | ICD-10-CM | POA: Diagnosis not present

## 2020-09-27 DIAGNOSIS — N186 End stage renal disease: Secondary | ICD-10-CM | POA: Diagnosis not present

## 2020-09-27 DIAGNOSIS — D631 Anemia in chronic kidney disease: Secondary | ICD-10-CM | POA: Diagnosis not present

## 2020-09-27 DIAGNOSIS — Z992 Dependence on renal dialysis: Secondary | ICD-10-CM | POA: Diagnosis not present

## 2020-09-27 DIAGNOSIS — T782XXA Anaphylactic shock, unspecified, initial encounter: Secondary | ICD-10-CM | POA: Diagnosis not present

## 2020-09-30 DIAGNOSIS — N2581 Secondary hyperparathyroidism of renal origin: Secondary | ICD-10-CM | POA: Diagnosis not present

## 2020-09-30 DIAGNOSIS — Z992 Dependence on renal dialysis: Secondary | ICD-10-CM | POA: Diagnosis not present

## 2020-09-30 DIAGNOSIS — D509 Iron deficiency anemia, unspecified: Secondary | ICD-10-CM | POA: Diagnosis not present

## 2020-09-30 DIAGNOSIS — T782XXA Anaphylactic shock, unspecified, initial encounter: Secondary | ICD-10-CM | POA: Diagnosis not present

## 2020-09-30 DIAGNOSIS — D631 Anemia in chronic kidney disease: Secondary | ICD-10-CM | POA: Diagnosis not present

## 2020-09-30 DIAGNOSIS — N186 End stage renal disease: Secondary | ICD-10-CM | POA: Diagnosis not present

## 2020-10-01 ENCOUNTER — Ambulatory Visit: Payer: Self-pay

## 2020-10-01 ENCOUNTER — Other Ambulatory Visit: Payer: Self-pay

## 2020-10-01 ENCOUNTER — Ambulatory Visit (INDEPENDENT_AMBULATORY_CARE_PROVIDER_SITE_OTHER): Payer: Medicare Other | Admitting: Orthopaedic Surgery

## 2020-10-01 VITALS — BP 84/47 | HR 85 | Ht 65.0 in | Wt 126.0 lb

## 2020-10-01 DIAGNOSIS — M2392 Unspecified internal derangement of left knee: Secondary | ICD-10-CM | POA: Diagnosis not present

## 2020-10-01 NOTE — Progress Notes (Signed)
Office Visit Note   Patient: Katie Nunez           Date of Birth: 21-May-1955           MRN: [id number] Visit Date: 10/01/2020              Requested by: [name], [place],  [place] [address] PCP: [name], MD   Assessment & Plan: Visit Diagnoses:  1. Knee locking, left     Plan: We will proceed with left knee MRI scan to evaluate her for meniscal tear with repetitive locking symptoms she has had versus loose body.  Follow-Up Instructions: No follow-ups on file.   Orders:  Orders Placed This Encounter  Procedures   XR KNEE 3 VIEW LEFT   MR Knee Left w/o contrast   No orders of the defined types were placed in this encounter.     Procedures: No procedures performed   Clinical Data: No additional findings.   Subjective: Chief Complaint  Patient presents with   Neck - Follow-up   Right Shoulder - Follow-up   Left Knee - Pain   Right Knee - Pain    HPI 65 year old female returns with ongoing problems with neck pain right shoulder pain.  She continues to have shoulder pain with range of motion.  She has had problems with both knees and she states her legs hurt in the popliteal region when she finally gets to bed.  Said problems with renal failure and dialysis.  Scleroderma rheumatoid arthritis Raynaud's.  Peripheral neuropathy, hypothyroidism.  Previous CVA with weakness of the right lower extremity.  Review of Systems 14 point system updated unchanged from 06/11/2020 other than as mentioned above.   Objective: Vital Signs: BP (!) 84/47   Pulse 85   Ht '5\' 5"'  (1.651 m)   Wt 126 lb (57.2 kg)   LMP 11/15/2008   BMI 20.97 kg/m   Physical Exam Constitutional:      Appearance: She is well-developed.  HENT:     Head: Normocephalic.     Right Ear: External ear normal.     Left Ear: External ear normal. There is no impacted cerumen.  Eyes:     Pupils: Pupils are equal, round, and reactive to light.  Neck:      Thyroid: No thyromegaly.     Trachea: No tracheal deviation.  Cardiovascular:     Rate and Rhythm: Normal rate.  Pulmonary:     Effort: Pulmonary effort is normal.  Abdominal:     Palpations: Abdomen is soft.  Musculoskeletal:     Cervical back: No rigidity.  Skin:    General: Skin is warm and dry.  Neurological:     Mental Status: She is alert and oriented to person, place, and time.  Psychiatric:        Behavior: Behavior normal.    Ortho Exam patient has medial and lateral joint line tenderness left knee.  Negative logroll the hips.  Left arm fistula.  Pain with extension of the left knee.  Specialty Comments:  No specialty comments available.  Imaging: No results found.   PMFS History: Patient Active Problem List   Diagnosis Date Noted   Knee locking, left 10/05/2020   Abnormality of gait 02/25/2020   Protein-calorie malnutrition, severe 01/19/2020   Epistaxis    Sleep disturbance    Slow transit constipation    Hemorrhoids    Chronic systolic congestive heart failure ([place])    ESRD  on dialysis [facility])    Right hemiparesis ([place])    Pressure injury of skin 12/20/2019   Intraparenchymal hemorrhage of brain ([place]) 12/18/2019   ICH (intracerebral hemorrhage) ([place]) 12/12/2019   Viral disease 08/22/2019   Chronic right-sided heart failure ([place]) 07/09/2019   Supraventricular tachycardia ([place]) 07/09/2019   Other cirrhosis of liver ([place]) 06/27/2019   Heart failure ([place]) 02/21/2019   Heart palpitations 08/14/2018   Other fluid overload 07/27/2018   Encounter for long-term (current) use of other medications 01/18/2018   Hypothyroidism 01/18/2018   Myalgia and myositis 01/18/2018   Chronic nephritis 01/18/2018   Other long term (current) drug therapy 01/18/2018   Sleep apnea 01/18/2018   Unspecified persistent mental disorders due to conditions classified elsewhere 01/18/2018   Venous reflux 01/18/2018   Vitamin B12 deficiency 01/18/2018   Vitamin D deficiency  01/18/2018   Obstructive lung disease ([place]) 12/30/2017   Other pruritus 12/27/2017   Eruption cyst 12/19/2017   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 ([place]) 11/28/2017   Chronic cough 11/11/2017   Pulmonary hypertension ([place]) 11/11/2017   Pulmonary arterial hypertension ([place]) 10/07/2017   Acute ITP ([place]) 01/05/2017   ESRD (end stage renal disease) ([place]) 12/28/2016   Encounter for removal of sutures 09/09/2016   Coagulation defect, unspecified ([place]) 09/01/2016   Underimmunization status 08/05/2016   Hemolytic anemia ([place]) 07/26/2016   Epistaxis, recurrent 07/26/2016   Unspecified protein-calorie malnutrition ([place]) 07/13/2016   Aftercare including intermittent dialysis ([place]) 07/07/2016   Anemia in chronic kidney disease 07/07/2016   Hypokalemia 07/07/2016   Iron deficiency anemia, unspecified 07/07/2016   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   Nonrheumatic mitral (valve) prolapse 07/07/2016   Other irritable bowel syndrome 07/07/2016   Other secondary thrombocytopenia 07/07/2016   Secondary hyperparathyroidism of renal origin ([place]) 07/07/2016   Thrombocytopenia ([place]) 07/01/2016   ARF (acute renal failure) ([place]) 06/25/2016   Anxiety 06/25/2016   Bilateral carpal tunnel syndrome 10/22/2015   Chronic gout without tophus 10/22/2015   Chronic nonintractable headache 10/08/2015   Fibroid uterus 01/03/2012   H/O vitamin D deficiency 01/03/2012   Post-menopausal 01/03/2012   Hereditary and idiopathic peripheral neuropathy 11/19/2011   Intestinal malabsorption 85/63/1497   Lichen planus 02/63/7858   Low back pain 11/19/2011   Diffuse spasm of esophagus 11/11/2011   ESR raised 11/11/2011   Postinflammatory pulmonary fibrosis ([place]) 11/11/2011   Scleroderma ([place]) 11/16/2010   Rheumatoid arthritis ([place]) 11/16/2010   Raynaud's disease 11/16/2010   Symptomatic menopausal or female climacteric states 11/16/2010   Past Medical History:  Diagnosis Date    Achalasia    Anxiety    Dysplasia of cervix, low grade (CIN 1)    Environmental allergies    "all year long" (12/27/2016)   ESRD (end stage renal disease) on dialysis ([place])    "TTS; [facility]" (12/27/2016)   Fibromyalgia    GERD (gastroesophageal reflux disease)    Gout    Hypertension    IBS (irritable bowel syndrome)    MVP (mitral valve prolapse)    RA (rheumatoid arthritis) ([place])    FOLLOWED BY DR. SHANAHAN   Raynaud's disease    Scleroderma ([place])    Seasonal allergies    Thrombocytopenia ([place]) 07/01/2016   Acute fall to 13,000 07/01/16   Tubular adenoma 01/08/2008   CECUM   Vitamin D deficiency     Family History  Problem Relation Age of Onset   Hypertension Mother    Diabetes Mother    Heart disease Father    Hypertension  Maternal Aunt    Diabetes Maternal Grandmother    Heart disease Paternal Grandfather    Cerebral palsy Cousin        1ST COUSIN?   Diabetes Paternal Grandmother     Past Surgical History:  Procedure Laterality Date   ANKLE FRACTURE SURGERY Right    AV FISTULA PLACEMENT Left 06/28/2016   Procedure: left arm ARTERIOVENOUS (AV) FISTULA CREATION;  Surgeon: Rosetta Posner, MD;  Location: [facility] OR;  Service: Vascular;  Laterality: Left;   [place] Left 09/27/2016   Procedure: LEFT UPPER ARM CEPHALIC VEIN TRANSPOSITION;  Surgeon: Rosetta Posner, MD;  Location: [facility] OR;  Service: Vascular;  Laterality: Left;   BREAST BIOPSY     "? side"   [place]     COLONOSCOPY W/ BIOPSIES  01/08/2008   INSERTION OF DIALYSIS CATHETER Right 06/28/2016   Procedure: INSERTION OF DIALYSIS CATHETER, right internal jugular;  Surgeon: Rosetta Posner, MD;  Location: [facility];  Service: Vascular;  Laterality: Right;   MYOMECTOMY     NASAL ENDOSCOPY WITH EPISTAXIS CONTROL N/A 12/29/2019   Procedure: NASAL ENDOSCOPY WITH EPISTAXIS CONTROL;  Surgeon: Leta Baptist, MD;  Location: [facility];  Service: ENT;  Laterality: N/A;   PELVIC LAPAROSCOPY   2011   superficial thrombophlebitis Left 07-2014   Social History   Occupational History   Not on file  Tobacco Use   Smoking status: Never   Smokeless tobacco: Never  Vaping Use   Vaping Use: Never used  Substance and Sexual Activity   Alcohol use: No   Drug use: No   Sexual activity: Not Currently    Birth control/protection: Post-menopausal

## 2020-10-02 DIAGNOSIS — D509 Iron deficiency anemia, unspecified: Secondary | ICD-10-CM | POA: Diagnosis not present

## 2020-10-02 DIAGNOSIS — N2581 Secondary hyperparathyroidism of renal origin: Secondary | ICD-10-CM | POA: Diagnosis not present

## 2020-10-02 DIAGNOSIS — N186 End stage renal disease: Secondary | ICD-10-CM | POA: Diagnosis not present

## 2020-10-02 DIAGNOSIS — T782XXA Anaphylactic shock, unspecified, initial encounter: Secondary | ICD-10-CM | POA: Diagnosis not present

## 2020-10-02 DIAGNOSIS — D631 Anemia in chronic kidney disease: Secondary | ICD-10-CM | POA: Diagnosis not present

## 2020-10-02 DIAGNOSIS — Z992 Dependence on renal dialysis: Secondary | ICD-10-CM | POA: Diagnosis not present

## 2020-10-04 DIAGNOSIS — Z992 Dependence on renal dialysis: Secondary | ICD-10-CM | POA: Diagnosis not present

## 2020-10-04 DIAGNOSIS — N2581 Secondary hyperparathyroidism of renal origin: Secondary | ICD-10-CM | POA: Diagnosis not present

## 2020-10-04 DIAGNOSIS — D509 Iron deficiency anemia, unspecified: Secondary | ICD-10-CM | POA: Diagnosis not present

## 2020-10-04 DIAGNOSIS — D631 Anemia in chronic kidney disease: Secondary | ICD-10-CM | POA: Diagnosis not present

## 2020-10-04 DIAGNOSIS — N186 End stage renal disease: Secondary | ICD-10-CM | POA: Diagnosis not present

## 2020-10-04 DIAGNOSIS — T782XXA Anaphylactic shock, unspecified, initial encounter: Secondary | ICD-10-CM | POA: Diagnosis not present

## 2020-10-05 DIAGNOSIS — M2392 Unspecified internal derangement of left knee: Secondary | ICD-10-CM | POA: Insufficient documentation

## 2020-10-07 DIAGNOSIS — T782XXA Anaphylactic shock, unspecified, initial encounter: Secondary | ICD-10-CM | POA: Diagnosis not present

## 2020-10-07 DIAGNOSIS — Z992 Dependence on renal dialysis: Secondary | ICD-10-CM | POA: Diagnosis not present

## 2020-10-07 DIAGNOSIS — N186 End stage renal disease: Secondary | ICD-10-CM | POA: Diagnosis not present

## 2020-10-07 DIAGNOSIS — D631 Anemia in chronic kidney disease: Secondary | ICD-10-CM | POA: Diagnosis not present

## 2020-10-07 DIAGNOSIS — D509 Iron deficiency anemia, unspecified: Secondary | ICD-10-CM | POA: Diagnosis not present

## 2020-10-07 DIAGNOSIS — N2581 Secondary hyperparathyroidism of renal origin: Secondary | ICD-10-CM | POA: Diagnosis not present

## 2020-10-08 DIAGNOSIS — Z23 Encounter for immunization: Secondary | ICD-10-CM | POA: Diagnosis not present

## 2020-10-09 DIAGNOSIS — D509 Iron deficiency anemia, unspecified: Secondary | ICD-10-CM | POA: Diagnosis not present

## 2020-10-09 DIAGNOSIS — T782XXA Anaphylactic shock, unspecified, initial encounter: Secondary | ICD-10-CM | POA: Diagnosis not present

## 2020-10-09 DIAGNOSIS — Z992 Dependence on renal dialysis: Secondary | ICD-10-CM | POA: Diagnosis not present

## 2020-10-09 DIAGNOSIS — N186 End stage renal disease: Secondary | ICD-10-CM | POA: Diagnosis not present

## 2020-10-09 DIAGNOSIS — D631 Anemia in chronic kidney disease: Secondary | ICD-10-CM | POA: Diagnosis not present

## 2020-10-09 DIAGNOSIS — N2581 Secondary hyperparathyroidism of renal origin: Secondary | ICD-10-CM | POA: Diagnosis not present

## 2020-10-10 DIAGNOSIS — N269 Renal sclerosis, unspecified: Secondary | ICD-10-CM | POA: Diagnosis not present

## 2020-10-10 DIAGNOSIS — Z992 Dependence on renal dialysis: Secondary | ICD-10-CM | POA: Diagnosis not present

## 2020-10-10 DIAGNOSIS — N186 End stage renal disease: Secondary | ICD-10-CM | POA: Diagnosis not present

## 2020-10-11 DIAGNOSIS — D631 Anemia in chronic kidney disease: Secondary | ICD-10-CM | POA: Diagnosis not present

## 2020-10-11 DIAGNOSIS — N2581 Secondary hyperparathyroidism of renal origin: Secondary | ICD-10-CM | POA: Diagnosis not present

## 2020-10-11 DIAGNOSIS — D509 Iron deficiency anemia, unspecified: Secondary | ICD-10-CM | POA: Diagnosis not present

## 2020-10-11 DIAGNOSIS — N186 End stage renal disease: Secondary | ICD-10-CM | POA: Diagnosis not present

## 2020-10-11 DIAGNOSIS — Z992 Dependence on renal dialysis: Secondary | ICD-10-CM | POA: Diagnosis not present

## 2020-10-14 ENCOUNTER — Telehealth: Payer: Self-pay | Admitting: Neurology

## 2020-10-14 DIAGNOSIS — N2581 Secondary hyperparathyroidism of renal origin: Secondary | ICD-10-CM | POA: Diagnosis not present

## 2020-10-14 DIAGNOSIS — D509 Iron deficiency anemia, unspecified: Secondary | ICD-10-CM | POA: Diagnosis not present

## 2020-10-14 DIAGNOSIS — N186 End stage renal disease: Secondary | ICD-10-CM | POA: Diagnosis not present

## 2020-10-14 DIAGNOSIS — D631 Anemia in chronic kidney disease: Secondary | ICD-10-CM | POA: Diagnosis not present

## 2020-10-14 DIAGNOSIS — Z992 Dependence on renal dialysis: Secondary | ICD-10-CM | POA: Diagnosis not present

## 2020-10-14 NOTE — Telephone Encounter (Signed)
Pt called stating that her BP has been dropping at her Dialysis appts and her Cardiologist is wanting to know how her Neurologist feels about her being on Midodrine. Please advise.

## 2020-10-14 NOTE — Telephone Encounter (Signed)
I returned the call to the patient. States her BP runs low normally (especially since having an ablation), not just during dialysis (she checks it at home too). Her systolic pressure tends to run in the 30'J-59'Z and diastolic in 68'X-57'I. Her cardiologist suggested maybe starting midodrine. He wanted her to get clearance from a neurology standpoint to start this particular medication. She is also getting clearance from her nephrologist.

## 2020-10-15 NOTE — Telephone Encounter (Signed)
I returned the call to the patient. She has been advised of Dr. Clydene Fake response and verbalized understanding.

## 2020-10-16 DIAGNOSIS — Z992 Dependence on renal dialysis: Secondary | ICD-10-CM | POA: Diagnosis not present

## 2020-10-16 DIAGNOSIS — N186 End stage renal disease: Secondary | ICD-10-CM | POA: Diagnosis not present

## 2020-10-16 DIAGNOSIS — D631 Anemia in chronic kidney disease: Secondary | ICD-10-CM | POA: Diagnosis not present

## 2020-10-16 DIAGNOSIS — N2581 Secondary hyperparathyroidism of renal origin: Secondary | ICD-10-CM | POA: Diagnosis not present

## 2020-10-16 DIAGNOSIS — D509 Iron deficiency anemia, unspecified: Secondary | ICD-10-CM | POA: Diagnosis not present

## 2020-10-17 DIAGNOSIS — Z992 Dependence on renal dialysis: Secondary | ICD-10-CM | POA: Diagnosis not present

## 2020-10-17 DIAGNOSIS — I871 Compression of vein: Secondary | ICD-10-CM | POA: Diagnosis not present

## 2020-10-17 DIAGNOSIS — T82858A Stenosis of vascular prosthetic devices, implants and grafts, initial encounter: Secondary | ICD-10-CM | POA: Diagnosis not present

## 2020-10-17 DIAGNOSIS — N186 End stage renal disease: Secondary | ICD-10-CM | POA: Diagnosis not present

## 2020-10-18 DIAGNOSIS — D631 Anemia in chronic kidney disease: Secondary | ICD-10-CM | POA: Diagnosis not present

## 2020-10-18 DIAGNOSIS — Z992 Dependence on renal dialysis: Secondary | ICD-10-CM | POA: Diagnosis not present

## 2020-10-18 DIAGNOSIS — N186 End stage renal disease: Secondary | ICD-10-CM | POA: Diagnosis not present

## 2020-10-18 DIAGNOSIS — D509 Iron deficiency anemia, unspecified: Secondary | ICD-10-CM | POA: Diagnosis not present

## 2020-10-18 DIAGNOSIS — N2581 Secondary hyperparathyroidism of renal origin: Secondary | ICD-10-CM | POA: Diagnosis not present

## 2020-10-19 DIAGNOSIS — I270 Primary pulmonary hypertension: Secondary | ICD-10-CM | POA: Diagnosis not present

## 2020-10-19 DIAGNOSIS — R0609 Other forms of dyspnea: Secondary | ICD-10-CM | POA: Diagnosis not present

## 2020-10-19 DIAGNOSIS — N186 End stage renal disease: Secondary | ICD-10-CM | POA: Diagnosis not present

## 2020-10-21 DIAGNOSIS — Z992 Dependence on renal dialysis: Secondary | ICD-10-CM | POA: Diagnosis not present

## 2020-10-21 DIAGNOSIS — D509 Iron deficiency anemia, unspecified: Secondary | ICD-10-CM | POA: Diagnosis not present

## 2020-10-21 DIAGNOSIS — N186 End stage renal disease: Secondary | ICD-10-CM | POA: Diagnosis not present

## 2020-10-21 DIAGNOSIS — D631 Anemia in chronic kidney disease: Secondary | ICD-10-CM | POA: Diagnosis not present

## 2020-10-21 DIAGNOSIS — I619 Nontraumatic intracerebral hemorrhage, unspecified: Secondary | ICD-10-CM | POA: Diagnosis not present

## 2020-10-21 DIAGNOSIS — N2581 Secondary hyperparathyroidism of renal origin: Secondary | ICD-10-CM | POA: Diagnosis not present

## 2020-10-22 ENCOUNTER — Ambulatory Visit
Admission: RE | Admit: 2020-10-22 | Discharge: 2020-10-22 | Disposition: A | Discharge status: Home / Self Care | Payer: Medicare Other | Source: Ambulatory Visit | Attending: Orthopaedic Surgery | Admitting: Orthopaedic Surgery

## 2020-10-22 ENCOUNTER — Other Ambulatory Visit: Payer: Self-pay

## 2020-10-22 DIAGNOSIS — M25562 Pain in left knee: Secondary | ICD-10-CM | POA: Diagnosis not present

## 2020-10-22 DIAGNOSIS — M2392 Unspecified internal derangement of left knee: Secondary | ICD-10-CM

## 2020-10-23 DIAGNOSIS — D631 Anemia in chronic kidney disease: Secondary | ICD-10-CM | POA: Diagnosis not present

## 2020-10-23 DIAGNOSIS — N2581 Secondary hyperparathyroidism of renal origin: Secondary | ICD-10-CM | POA: Diagnosis not present

## 2020-10-23 DIAGNOSIS — N186 End stage renal disease: Secondary | ICD-10-CM | POA: Diagnosis not present

## 2020-10-23 DIAGNOSIS — Z992 Dependence on renal dialysis: Secondary | ICD-10-CM | POA: Diagnosis not present

## 2020-10-23 DIAGNOSIS — D509 Iron deficiency anemia, unspecified: Secondary | ICD-10-CM | POA: Diagnosis not present

## 2020-10-25 DIAGNOSIS — D509 Iron deficiency anemia, unspecified: Secondary | ICD-10-CM | POA: Diagnosis not present

## 2020-10-25 DIAGNOSIS — N186 End stage renal disease: Secondary | ICD-10-CM | POA: Diagnosis not present

## 2020-10-25 DIAGNOSIS — D631 Anemia in chronic kidney disease: Secondary | ICD-10-CM | POA: Diagnosis not present

## 2020-10-25 DIAGNOSIS — Z992 Dependence on renal dialysis: Secondary | ICD-10-CM | POA: Diagnosis not present

## 2020-10-25 DIAGNOSIS — N2581 Secondary hyperparathyroidism of renal origin: Secondary | ICD-10-CM | POA: Diagnosis not present

## 2020-10-27 DIAGNOSIS — M349 Systemic sclerosis, unspecified: Secondary | ICD-10-CM | POA: Diagnosis not present

## 2020-10-27 DIAGNOSIS — I272 Pulmonary hypertension, unspecified: Secondary | ICD-10-CM | POA: Diagnosis not present

## 2020-10-27 DIAGNOSIS — K5901 Slow transit constipation: Secondary | ICD-10-CM | POA: Diagnosis not present

## 2020-10-27 DIAGNOSIS — E538 Deficiency of other specified B group vitamins: Secondary | ICD-10-CM | POA: Diagnosis not present

## 2020-10-27 DIAGNOSIS — I10 Essential (primary) hypertension: Secondary | ICD-10-CM | POA: Diagnosis not present

## 2020-10-27 DIAGNOSIS — I7090 Unspecified atherosclerosis: Secondary | ICD-10-CM | POA: Diagnosis not present

## 2020-10-27 DIAGNOSIS — N816 Rectocele: Secondary | ICD-10-CM | POA: Diagnosis not present

## 2020-10-27 DIAGNOSIS — N186 End stage renal disease: Secondary | ICD-10-CM | POA: Diagnosis not present

## 2020-10-27 DIAGNOSIS — Z992 Dependence on renal dialysis: Secondary | ICD-10-CM | POA: Diagnosis not present

## 2020-10-28 DIAGNOSIS — Z992 Dependence on renal dialysis: Secondary | ICD-10-CM | POA: Diagnosis not present

## 2020-10-28 DIAGNOSIS — D509 Iron deficiency anemia, unspecified: Secondary | ICD-10-CM | POA: Diagnosis not present

## 2020-10-28 DIAGNOSIS — N186 End stage renal disease: Secondary | ICD-10-CM | POA: Diagnosis not present

## 2020-10-28 DIAGNOSIS — N2581 Secondary hyperparathyroidism of renal origin: Secondary | ICD-10-CM | POA: Diagnosis not present

## 2020-10-28 DIAGNOSIS — D631 Anemia in chronic kidney disease: Secondary | ICD-10-CM | POA: Diagnosis not present

## 2020-10-29 ENCOUNTER — Ambulatory Visit (INDEPENDENT_AMBULATORY_CARE_PROVIDER_SITE_OTHER): Payer: Medicare Other | Admitting: Orthopaedic Surgery

## 2020-10-29 VITALS — BP 106/66 | HR 65

## 2020-10-29 DIAGNOSIS — M542 Cervicalgia: Secondary | ICD-10-CM

## 2020-10-29 NOTE — Progress Notes (Signed)
Office Visit Note   Patient: Katie Nunez           Date of Birth: Jan 17, 1955           MRN: [id number] Visit Date: 10/29/2020              Requested by: [name], [place],  [place] [id number] PCP: [name], MD   Assessment & Plan: Visit Diagnoses:  1. Neck pain     Plan: We discussed treatment options for her persistent neck and shoulder pain.  We will set up some physical therapy for treatment and I will recheck her in 6 weeks.  Follow-Up Instructions: Return in about 6 weeks (around 12/10/2020).   Orders:  Orders Placed This Encounter  Procedures   Ambulatory referral to Physical Therapy   No orders of the defined types were placed in this encounter.     Procedures: No procedures performed   Clinical Data: No additional findings.   Subjective: Chief Complaint  Patient presents with   Left Knee - Follow-up    MRI review     HPI 65 year old female with rheumatoid arthritis, renal failure chronic dialysis history of scleroderma Raynaud's peripheral neuropathy returns for follow-up MRI scan of her left knee.  She had pain in her left knee with catching and she states actually her knee has been feeling a little bit better.  She had pain and giving way of her knee but states that she has not had as much of the sharp pain which is on the medial joint line.  MRI scan shows mild intrasubstance degeneration meniscus without well-defined tear.  The rest of the MRI was unremarkable.  She states she has had increased pain with her neck primarily in the trapezial muscle pain with rotation of her neck without myelopathic or radicular pain.  Previous x-rays showed no instability at C1-C2 no anterolisthesis at other levels without significant degenerative changes.  Review of Systems No myelopathic symptoms no chills or fever.  Positive for rheumatoid arthritis treatment, chronic dialysis.  All the systems noncontributory to  HPI.  Objective: Vital Signs: BP 106/66   Pulse 65   LMP 11/15/2008   Physical Exam Constitutional:      Appearance: She is well-developed. She is ill-appearing.  HENT:     Head: Normocephalic.     Right Ear: External ear normal.     Left Ear: External ear normal. There is no impacted cerumen.  Eyes:     Pupils: Pupils are equal, round, and reactive to light.  Neck:     Thyroid: No thyromegaly.     Trachea: No tracheal deviation.  Cardiovascular:     Rate and Rhythm: Normal rate.  Pulmonary:     Effort: Pulmonary effort is normal.  Abdominal:     Palpations: Abdomen is soft.  Musculoskeletal:     Cervical back: No rigidity.  Skin:    General: Skin is warm and dry.  Neurological:     Mental Status: She is alert and oriented to person, place, and time.  Psychiatric:        Behavior: Behavior normal.    Ortho Exam dialysis site.  Trapezial tenderness negative Spur[place].  For percent cervical rotation.  Medial joint line tenderness to the knee without locking.  Collateral ligaments are stable ACL PCL exam is normal.  Negative logroll hips.  Specialty Comments:  No specialty comments available.  Imaging: No results found.   PMFS History: Patient Active  Problem List   Diagnosis Date Noted   Neck pain 10/30/2020   Knee locking, left 10/05/2020   Abnormality of gait 02/25/2020   Protein-calorie malnutrition, severe 01/19/2020   Epistaxis    Sleep disturbance    Slow transit constipation    Hemorrhoids    Chronic systolic congestive heart failure ([place])    ESRD on dialysis ([place])    Right hemiparesis ([place])    Pressure injury of skin 12/20/2019   Intraparenchymal hemorrhage of brain ([place]) 12/18/2019   ICH (intracerebral hemorrhage) ([place]) 12/12/2019   Viral disease 08/22/2019   Chronic right-sided heart failure ([place]) 07/09/2019   Supraventricular tachycardia ([place]) 07/09/2019   Other cirrhosis of liver ([place]) 06/27/2019   Heart failure ([place]) 02/21/2019   Heart  palpitations 08/14/2018   Other fluid overload 07/27/2018   Encounter for long-term (current) use of other medications 01/18/2018   Hypothyroidism 01/18/2018   Myalgia and myositis 01/18/2018   Chronic nephritis 01/18/2018   Other long term (current) drug therapy 01/18/2018   Sleep apnea 01/18/2018   Unspecified persistent mental disorders due to conditions classified elsewhere 01/18/2018   Venous reflux 01/18/2018   Vitamin B12 deficiency 01/18/2018   Vitamin D deficiency 01/18/2018   Obstructive lung disease ([place]) 12/30/2017   Other pruritus 12/27/2017   Eruption cyst 12/19/2017   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 ([place]) 11/28/2017   Chronic cough 11/11/2017   Pulmonary hypertension ([place]) 11/11/2017   Pulmonary arterial hypertension ([place]) 10/07/2017   Acute ITP ([place]) 01/05/2017   ESRD (end stage renal disease) ([place]) 12/28/2016   Encounter for removal of sutures 09/09/2016   Coagulation defect, unspecified ([place]) 09/01/2016   Underimmunization status 08/05/2016   Hemolytic anemia ([place]) 07/26/2016   Epistaxis, recurrent 07/26/2016   Unspecified protein-calorie malnutrition ([place]) 07/13/2016   Aftercare including intermittent dialysis ([place]) 07/07/2016   Anemia in chronic kidney disease 07/07/2016   Hypokalemia 07/07/2016   Iron deficiency anemia, unspecified 07/07/2016   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   Nonrheumatic mitral (valve) prolapse 07/07/2016   Other irritable bowel syndrome 07/07/2016   Other secondary thrombocytopenia 07/07/2016   Secondary hyperparathyroidism of renal origin ([place]) 07/07/2016   Thrombocytopenia ([place]) 07/01/2016   ARF (acute renal failure) ([place]) 06/25/2016   Anxiety 06/25/2016   Bilateral carpal tunnel syndrome 10/22/2015   Chronic gout without tophus 10/22/2015   Chronic nonintractable headache 10/08/2015   Fibroid uterus 01/03/2012   H/O vitamin D deficiency 01/03/2012   Post-menopausal 01/03/2012   Hereditary  and idiopathic peripheral neuropathy 11/19/2011   Intestinal malabsorption 69/67/8938   Lichen planus 10/27/5100   Low back pain 11/19/2011   Diffuse spasm of esophagus 11/11/2011   ESR raised 11/11/2011   Postinflammatory pulmonary fibrosis ([place]) 11/11/2011   Scleroderma ([place]) 11/16/2010   Rheumatoid arthritis ([place]) 11/16/2010   Raynaud's disease 11/16/2010   Symptomatic menopausal or female climacteric states 11/16/2010   Past Medical History:  Diagnosis Date   Achalasia    Anxiety    Dysplasia of cervix, low grade (CIN 1)    Environmental allergies    "all year long" (12/27/2016)   ESRD (end stage renal disease) on dialysis ([place])    "TTS; [place]" (12/27/2016)   Fibromyalgia    GERD (gastroesophageal reflux disease)    Gout    Hypertension    IBS (irritable bowel syndrome)    MVP (mitral valve prolapse)    RA (rheumatoid arthritis) ([place])    FOLLOWED BY DR. SHANAHAN   Raynaud's disease    Scleroderma ([place])  Seasonal allergies    Thrombocytopenia (HCC) 07/01/2016   Acute fall to 13,000 07/01/16   Tubular adenoma 01/08/2008   CECUM   Vitamin D deficiency     Family History  Problem Relation Age of Onset   Hypertension Mother    Diabetes Mother    Heart disease Father    Hypertension Maternal Aunt    Diabetes Maternal Grandmother    Heart disease Paternal Grandfather    Cerebral palsy Cousin        1ST COUSIN?   Diabetes Paternal Grandmother     Past Surgical History:  Procedure Laterality Date   ANKLE FRACTURE SURGERY Right    AV FISTULA PLACEMENT Left 06/28/2016   Procedure: left arm ARTERIOVENOUS (AV) FISTULA CREATION;  Surgeon: Rosetta Posner, MD;  Location[facility];  Service: Vascular;  Laterality: Left;   [place] Left 09/27/2016   Procedure: LEFT UPPER ARM CEPHALIC VEIN TRANSPOSITION;  Surgeon: Rosetta Posner, MD;  Location[facility];  Service: Vascular;  Laterality: Left;   BREAST BIOPSY     "? side"   [facility]     COLONOSCOPY W/ BIOPSIES  01/08/2008   INSERTION OF DIALYSIS CATHETER Right 06/28/2016   Procedure: INSERTION OF DIALYSIS CATHETER, right internal jugular;  Surgeon: Rosetta Posner, MD;  Location: [facility];  Service: Vascular;  Laterality: Right;   MYOMECTOMY     NASAL ENDOSCOPY WITH EPISTAXIS CONTROL N/A 12/29/2019   Procedure: NASAL ENDOSCOPY WITH EPISTAXIS CONTROL;  Surgeon: Leta Baptist, MD;  Location: [facility];  Service: ENT;  Laterality: N/A;   PELVIC LAPAROSCOPY  2011   superficial thrombophlebitis Left 07-2014   Social History   Occupational History   Not on file  Tobacco Use   Smoking status: Never   Smokeless tobacco: Never  Vaping Use   Vaping Use: Never used  Substance and Sexual Activity   Alcohol use: No   Drug use: No   Sexual activity: Not Currently    Birth control/protection: Post-menopausal

## 2020-10-30 DIAGNOSIS — Z992 Dependence on renal dialysis: Secondary | ICD-10-CM | POA: Diagnosis not present

## 2020-10-30 DIAGNOSIS — D631 Anemia in chronic kidney disease: Secondary | ICD-10-CM | POA: Diagnosis not present

## 2020-10-30 DIAGNOSIS — N186 End stage renal disease: Secondary | ICD-10-CM | POA: Diagnosis not present

## 2020-10-30 DIAGNOSIS — N2581 Secondary hyperparathyroidism of renal origin: Secondary | ICD-10-CM | POA: Diagnosis not present

## 2020-10-30 DIAGNOSIS — D509 Iron deficiency anemia, unspecified: Secondary | ICD-10-CM | POA: Diagnosis not present

## 2020-10-30 DIAGNOSIS — M542 Cervicalgia: Secondary | ICD-10-CM | POA: Insufficient documentation

## 2020-11-01 DIAGNOSIS — N186 End stage renal disease: Secondary | ICD-10-CM | POA: Diagnosis not present

## 2020-11-01 DIAGNOSIS — N2581 Secondary hyperparathyroidism of renal origin: Secondary | ICD-10-CM | POA: Diagnosis not present

## 2020-11-01 DIAGNOSIS — D509 Iron deficiency anemia, unspecified: Secondary | ICD-10-CM | POA: Diagnosis not present

## 2020-11-01 DIAGNOSIS — Z992 Dependence on renal dialysis: Secondary | ICD-10-CM | POA: Diagnosis not present

## 2020-11-01 DIAGNOSIS — D631 Anemia in chronic kidney disease: Secondary | ICD-10-CM | POA: Diagnosis not present

## 2020-11-04 DIAGNOSIS — Z992 Dependence on renal dialysis: Secondary | ICD-10-CM | POA: Diagnosis not present

## 2020-11-04 DIAGNOSIS — D509 Iron deficiency anemia, unspecified: Secondary | ICD-10-CM | POA: Diagnosis not present

## 2020-11-04 DIAGNOSIS — N186 End stage renal disease: Secondary | ICD-10-CM | POA: Diagnosis not present

## 2020-11-04 DIAGNOSIS — D631 Anemia in chronic kidney disease: Secondary | ICD-10-CM | POA: Diagnosis not present

## 2020-11-04 DIAGNOSIS — N2581 Secondary hyperparathyroidism of renal origin: Secondary | ICD-10-CM | POA: Diagnosis not present

## 2020-11-05 ENCOUNTER — Ambulatory Visit: Payer: Medicare Other | Admitting: Adult Health

## 2020-11-05 DIAGNOSIS — K635 Polyp of colon: Secondary | ICD-10-CM | POA: Diagnosis not present

## 2020-11-05 DIAGNOSIS — R197 Diarrhea, unspecified: Secondary | ICD-10-CM | POA: Diagnosis not present

## 2020-11-05 DIAGNOSIS — I8510 Secondary esophageal varices without bleeding: Secondary | ICD-10-CM | POA: Diagnosis not present

## 2020-11-05 DIAGNOSIS — I8500 Esophageal varices without bleeding: Secondary | ICD-10-CM | POA: Diagnosis not present

## 2020-11-05 DIAGNOSIS — K319 Disease of stomach and duodenum, unspecified: Secondary | ICD-10-CM | POA: Diagnosis not present

## 2020-11-05 DIAGNOSIS — D120 Benign neoplasm of cecum: Secondary | ICD-10-CM | POA: Diagnosis not present

## 2020-11-05 DIAGNOSIS — K7460 Unspecified cirrhosis of liver: Secondary | ICD-10-CM | POA: Diagnosis not present

## 2020-11-05 DIAGNOSIS — Z13810 Encounter for screening for upper gastrointestinal disorder: Secondary | ICD-10-CM | POA: Diagnosis not present

## 2020-11-05 DIAGNOSIS — I868 Varicose veins of other specified sites: Secondary | ICD-10-CM | POA: Diagnosis not present

## 2020-11-05 DIAGNOSIS — K3189 Other diseases of stomach and duodenum: Secondary | ICD-10-CM | POA: Diagnosis not present

## 2020-11-05 DIAGNOSIS — K5900 Constipation, unspecified: Secondary | ICD-10-CM | POA: Diagnosis not present

## 2020-11-05 DIAGNOSIS — I10 Essential (primary) hypertension: Secondary | ICD-10-CM | POA: Diagnosis not present

## 2020-11-05 DIAGNOSIS — D123 Benign neoplasm of transverse colon: Secondary | ICD-10-CM | POA: Diagnosis not present

## 2020-11-05 DIAGNOSIS — Z86010 Personal history of colonic polyps: Secondary | ICD-10-CM | POA: Diagnosis not present

## 2020-11-06 DIAGNOSIS — D509 Iron deficiency anemia, unspecified: Secondary | ICD-10-CM | POA: Diagnosis not present

## 2020-11-06 DIAGNOSIS — N186 End stage renal disease: Secondary | ICD-10-CM | POA: Diagnosis not present

## 2020-11-06 DIAGNOSIS — D631 Anemia in chronic kidney disease: Secondary | ICD-10-CM | POA: Diagnosis not present

## 2020-11-06 DIAGNOSIS — Z992 Dependence on renal dialysis: Secondary | ICD-10-CM | POA: Diagnosis not present

## 2020-11-06 DIAGNOSIS — N2581 Secondary hyperparathyroidism of renal origin: Secondary | ICD-10-CM | POA: Diagnosis not present

## 2020-11-08 DIAGNOSIS — D509 Iron deficiency anemia, unspecified: Secondary | ICD-10-CM | POA: Diagnosis not present

## 2020-11-08 DIAGNOSIS — Z992 Dependence on renal dialysis: Secondary | ICD-10-CM | POA: Diagnosis not present

## 2020-11-08 DIAGNOSIS — N186 End stage renal disease: Secondary | ICD-10-CM | POA: Diagnosis not present

## 2020-11-08 DIAGNOSIS — D631 Anemia in chronic kidney disease: Secondary | ICD-10-CM | POA: Diagnosis not present

## 2020-11-08 DIAGNOSIS — N2581 Secondary hyperparathyroidism of renal origin: Secondary | ICD-10-CM | POA: Diagnosis not present

## 2020-11-08 DIAGNOSIS — D122 Benign neoplasm of ascending colon: Secondary | ICD-10-CM | POA: Insufficient documentation

## 2020-11-10 DIAGNOSIS — Z992 Dependence on renal dialysis: Secondary | ICD-10-CM | POA: Diagnosis not present

## 2020-11-10 DIAGNOSIS — N269 Renal sclerosis, unspecified: Secondary | ICD-10-CM | POA: Diagnosis not present

## 2020-11-10 DIAGNOSIS — N186 End stage renal disease: Secondary | ICD-10-CM | POA: Diagnosis not present

## 2020-11-11 DIAGNOSIS — N2581 Secondary hyperparathyroidism of renal origin: Secondary | ICD-10-CM | POA: Diagnosis not present

## 2020-11-11 DIAGNOSIS — Z992 Dependence on renal dialysis: Secondary | ICD-10-CM | POA: Diagnosis not present

## 2020-11-11 DIAGNOSIS — D509 Iron deficiency anemia, unspecified: Secondary | ICD-10-CM | POA: Diagnosis not present

## 2020-11-11 DIAGNOSIS — D631 Anemia in chronic kidney disease: Secondary | ICD-10-CM | POA: Diagnosis not present

## 2020-11-11 DIAGNOSIS — N186 End stage renal disease: Secondary | ICD-10-CM | POA: Diagnosis not present

## 2020-11-13 DIAGNOSIS — D631 Anemia in chronic kidney disease: Secondary | ICD-10-CM | POA: Diagnosis not present

## 2020-11-13 DIAGNOSIS — N186 End stage renal disease: Secondary | ICD-10-CM | POA: Diagnosis not present

## 2020-11-13 DIAGNOSIS — N2581 Secondary hyperparathyroidism of renal origin: Secondary | ICD-10-CM | POA: Diagnosis not present

## 2020-11-13 DIAGNOSIS — D509 Iron deficiency anemia, unspecified: Secondary | ICD-10-CM | POA: Diagnosis not present

## 2020-11-13 DIAGNOSIS — Z992 Dependence on renal dialysis: Secondary | ICD-10-CM | POA: Diagnosis not present

## 2020-11-14 ENCOUNTER — Encounter: Payer: Self-pay | Admitting: Rehabilitative and Restorative Service Providers"

## 2020-11-14 ENCOUNTER — Other Ambulatory Visit: Payer: Self-pay

## 2020-11-14 ENCOUNTER — Ambulatory Visit (INDEPENDENT_AMBULATORY_CARE_PROVIDER_SITE_OTHER): Payer: Medicare Other | Admitting: Rehabilitative and Restorative Service Providers"

## 2020-11-14 DIAGNOSIS — M542 Cervicalgia: Secondary | ICD-10-CM | POA: Diagnosis not present

## 2020-11-14 DIAGNOSIS — R293 Abnormal posture: Secondary | ICD-10-CM

## 2020-11-14 DIAGNOSIS — M25511 Pain in right shoulder: Secondary | ICD-10-CM | POA: Diagnosis not present

## 2020-11-14 DIAGNOSIS — G8929 Other chronic pain: Secondary | ICD-10-CM

## 2020-11-14 DIAGNOSIS — M6281 Muscle weakness (generalized): Secondary | ICD-10-CM | POA: Diagnosis not present

## 2020-11-14 NOTE — Therapy (Addendum)
[facility] Physical Therapy [address] [place], [place], [address] Phone: [phone]   Fax:  [phone]  Physical Therapy Evaluation  Patient Details  Name: Katie Nunez MRN: [id number] Date of Birth: February 28, 1955 Referring Provider (PT): Marybelle Killings, MD   Encounter Date: 11/14/2020   PT End of Session - 11/14/20 0940     Visit Number 1    Number of Visits 20    Date for PT Re-Evaluation 01/23/21    Authorization Type U[employer] Medicare $20 60 visits per year    PT Start Time 0942    PT Stop Time 1012    PT Time Calculation (min) 30 min    Activity Tolerance Patient tolerated treatment well    Behavior During Therapy [facility] for tasks assessed/performed             Past Medical History:  Diagnosis Date   Achalasia    Anxiety    Dysplasia of cervix, low grade (CIN 1)    Environmental allergies    "all year long" (12/27/2016)   ESRD (end stage renal disease) on dialysis ([place])    "TTS; [place]" (12/27/2016)   Fibromyalgia    GERD (gastroesophageal reflux disease)    Gout    Hypertension    IBS (irritable bowel syndrome)    MVP (mitral valve prolapse)    RA (rheumatoid arthritis) ([place])    FOLLOWED BY DR. SHANAHAN   Raynaud's disease    Scleroderma ([place])    Seasonal allergies    Thrombocytopenia ([place]) 07/01/2016   Acute fall to 13,000 07/01/16   Tubular adenoma 01/08/2008   CECUM   Vitamin D deficiency     Past Surgical History:  Procedure Laterality Date   ANKLE FRACTURE SURGERY Right    AV FISTULA PLACEMENT Left 06/28/2016   Procedure: left arm ARTERIOVENOUS (AV) FISTULA CREATION;  Surgeon: Rosetta Posner, MD;  [facility];  Service: Vascular;  Laterality: Left;   [place] Left 09/27/2016   Procedure: LEFT UPPER ARM CEPHALIC VEIN TRANSPOSITION;  Surgeon: Rosetta Posner, MD;  [facility];  Service: Vascular;  Laterality: Left;   BREAST BIOPSY     "? side"   [place]      COLONOSCOPY W/ BIOPSIES  01/08/2008   INSERTION OF DIALYSIS CATHETER Right 06/28/2016   Procedure: INSERTION OF DIALYSIS CATHETER, right internal jugular;  Surgeon: Rosetta Posner, MD;  Location: [facility];  Service: Vascular;  Laterality: Right;   MYOMECTOMY     NASAL ENDOSCOPY WITH EPISTAXIS CONTROL N/A 12/29/2019   Procedure: NASAL ENDOSCOPY WITH EPISTAXIS CONTROL;  Surgeon: Leta Baptist, MD;  Location: [facility];  Service: ENT;  Laterality: N/A;   PELVIC LAPAROSCOPY  2011   superficial thrombophlebitis Left 07-2014    There were no vitals filed for this visit.    Subjective Assessment - 11/14/20 0945     Subjective Pt. stated Rt neck/shoulder pain chief complaint.   Pt. indicated "trouble for years." but worse since stroke on Dec 12, 2019 affecting Rt side.  Pt. stated difficulty getting comfortable in bed and tightness noted throughout day.    Pertinent History HTN, RA, IBS, ESRD, anxiety, fibromyalgia    Limitations House hold activities;Sitting    Diagnostic tests xrays neck no anterolistehesis    Patient Stated Goals Reduce pain    Currently in Pain? Yes    Pain Score 7    pain at worst 7/10   Pain  Location Neck    Pain Orientation Right   sometimes on Lt but more on Rt   Pain Descriptors / Indicators Constant;Tightness    Pain Type Chronic pain    Pain Radiating Towards Rt neck/shoulder, denied numbness/tingling down Rt arm    Pain Onset More than a month ago    Pain Frequency Constant    Aggravating Factors  tightness throughout day, difficulty sleeping    Pain Relieving Factors muscle relaxer at times, heat    Effect of Pain on Daily Activities Limited in sleep, movements in day                [facility] PT Assessment - 11/14/20 0001       Assessment   Medical Diagnosis M54.2 (ICD-10-CM) - Neck pain    Referring Provider (PT) Marybelle Killings, MD    Onset Date/Surgical Date 12/12/19    Hand Dominance Right      Precautions   Precautions None      Balance Screen   Has the  patient fallen in the past 6 months No    Has the patient had a decrease in activity level because of a fear of falling?  No    Is the patient reluctant to leave their home because of a fear of falling?  No      Home Ecologist residence    Living Arrangements Spouse/significant other    Additional Comments Stairs to bedroom/bathroom      Prior Function   Vocation Requirements Retired    Location manager, writing      Observation/Other Assessments   Focus on [place] (FOTO)  inatke 45%, predicted 54%      Sensation   Light Touch Appears Intact      Posture/Postural Control   Posture/Postural Control Postural limitations    Postural Limitations Forward head;Rounded Shoulders;Increased thoracic kyphosis      ROM / Strength   AROM / PROM / Strength AROM;PROM;Strength      AROM   Overall AROM Comments Rt shoulder forward elevation against gravity to approx. 110 degrees    AROM Assessment Site Shoulder;Cervical    Cervical Flexion 70   stretching   Cervical Extension 50   tightness   Cervical - Right Rotation 55   pain on Rt side   Cervical - Left Rotation 55      Strength   Strength Assessment Site Shoulder;Elbow    Right/Left Shoulder Left;Right    Right Shoulder Flexion 4/5    Right Shoulder ABduction 4/5    Right Shoulder Internal Rotation 5/5    Right Shoulder External Rotation 4/5    Left Shoulder Flexion 5/5    Left Shoulder ABduction 5/5    Left Shoulder Internal Rotation 5/5    Left Shoulder External Rotation 5/5    Right/Left Elbow Right;Left    Right Elbow Flexion 5/5    Right Elbow Extension 5/5    Left Elbow Flexion 5/5    Left Elbow Extension 5/5      Palpation   Spinal mobility passive accessory mobility limitations in cervical and thoracic grossly noted    Palpation comment Trigger points in Rt upper trap      Special Tests   Other special tests (-) Spurlings Rt      Ambulation/Gait   Gait Comments Has  Small base cane in Rt hand but carried in throughout walking in clinic.  Rt foot clearing, dragging noted in swing  Objective measurements completed on examination: See above findings.       [facility] Adult PT Treatment/Exercise - 11/14/20 0001       Exercises   Exercises Other Exercises    Other Exercises  HEP instruction/performance c cues for techniques, handout provided.  Trial set performed of each for comprehension and symptom assessment.  Consisting of seated scapular retraction, cervical retraction and Rt upper trap stretch.  Postural cues given.      Manual Therapy   Manual therapy comments compression to Rt upper trap c movement                     PT Education - 11/14/20 0940     Education Details HEP, POC    Person(s) Educated Patient    Methods Explanation;Demonstration;Verbal cues;Handout    Comprehension Returned demonstration;Verbalized understanding              PT Short Term Goals - 11/14/20 0940       PT SHORT TERM GOAL #1   Title Patient will demonstrate independent use of home exercise program to maintain progress from in clinic treatments.    Time 3    Period Weeks    Status New    Target Date 12/05/20               PT Long Term Goals - 11/14/20 0940       PT LONG TERM GOAL #1   Title Patient will demonstrate/report pain at worst less than or equal to 2/10 to facilitate minimal limitation in daily activity secondary to pain symptoms.    Time 10    Period Weeks    Target Date 01/23/21      PT LONG TERM GOAL #2   Title Patient will demonstrate independent use of home exercise program to facilitate ability to maintain/progress functional gains from skilled physical therapy services.    Time 10    Period Weeks    Status New    Target Date 01/23/21      PT LONG TERM GOAL #3   Title Pt. will demonstrate FOTO outcome > or = 54% to indicated reduced disability due to condition.    Time 10     Period Weeks    Status New    Target Date 01/23/21      PT LONG TERM GOAL #4   Title Patient will demonstrate cervical AROM WFL s symptoms to facilitate daily activity including driving, self care at PLOF s limitation due to symptoms.    Time 10    Period Weeks    Status New    Target Date 01/23/21      PT LONG TERM GOAL #5   Title Pt. will demonstrate/report ability to sleep s difficulty due to symptoms.    Time 10    Period Weeks    Status New    Target Date 01/23/21      Additional Long Term Goals   Additional Long Term Goals Yes                    Plan - 11/14/20 0941     Clinical Impression Statement Patient is a 65 y.o. who comes to clinic with complaints of cervical pain, Rt shoulder pain with mobility, strength and movement coordination deficits that impair their ability to perform usual daily and recreational functional activities without increase difficulty/symptoms at this time.  Patient to benefit from skilled PT services to address impairments and limitations  to improve to previous level of function without restriction secondary to condition.    Personal Factors and Comorbidities Comorbidity 3+    Comorbidities HTN, RA, IBS, ESRD, anxiety, fibromyalgia    Examination-Activity Limitations Sleep;Sit;Carry;Lift;Reach Overhead;Dressing;Hygiene/Grooming    Stability/Clinical Decision Making Stable/Uncomplicated    Clinical Decision Making Low    Rehab Potential Good    PT Frequency 2x / week    PT Duration Other (comment)   10weeks   PT Treatment/Interventions ADLs/Self Care Home Management;Cryotherapy;Electrical Stimulation;Iontophoresis 63m/ml Dexamethasone;Moist Heat;Traction;Therapeutic exercise;Balance training;Therapeutic activities;Functional mobility training;Gait training;Stair training;DME Instruction;Ultrasound;Neuromuscular re-education;Patient/family education;Passive range of motion;Spinal Manipulations;Joint Manipulations;Dry needling;Taping;Manual  techniques    PT Next Visit Plan DN, Possible manual for cervical and thoracic gains.    PT Home Exercise Plan [id number]    Consulted and Agree with Plan of Care Patient             Patient will benefit from skilled therapeutic intervention in order to improve the following deficits and impairments:  Abnormal gait, Decreased endurance, Hypomobility, Pain, Decreased strength, Increased fascial restricitons, Decreased activity tolerance, Impaired flexibility, Improper body mechanics, Impaired perceived functional ability, Postural dysfunction, Difficulty walking, Decreased mobility, Decreased balance, Decreased range of motion  Visit Diagnosis: Cervicalgia - Plan: PT plan of care cert/re-cert  Chronic right shoulder pain - Plan: PT plan of care cert/re-cert  Muscle weakness (generalized) - Plan: PT plan of care cert/re-cert  Abnormal posture - Plan: PT plan of care cert/re-cert     Problem List Patient Active Problem List   Diagnosis Date Noted   Neck pain 10/30/2020   Knee locking, left 10/05/2020   Abnormality of gait 02/25/2020   Protein-calorie malnutrition, severe 01/19/2020   Epistaxis    Sleep disturbance    Slow transit constipation    Hemorrhoids    Chronic systolic congestive heart failure (HCC)    ESRD on dialysis (HCC)    Right hemiparesis (HCC)    Pressure injury of skin 12/20/2019   Intraparenchymal hemorrhage of brain ([place] 12/18/2019   ICH (intracerebral hemorrhage) ([place] 12/12/2019   Viral disease 08/22/2019   Chronic right-sided heart failure ([place] 07/09/2019   Supraventricular tachycardia ([place] 07/09/2019   Other cirrhosis of liver ([place] 06/27/2019   Heart failure ([place] 02/21/2019   Heart palpitations 08/14/2018   Other fluid overload 07/27/2018   Encounter for long-term (current) use of other medications 01/18/2018   Hypothyroidism 01/18/2018   Myalgia and myositis 01/18/2018   Chronic nephritis 01/18/2018   Other long term (current) drug therapy  01/18/2018   Sleep apnea 01/18/2018   Unspecified persistent mental disorders due to conditions classified elsewhere 01/18/2018   Venous reflux 01/18/2018   Vitamin B12 deficiency 01/18/2018   Vitamin D deficiency 01/18/2018   Obstructive lung disease ([place] 12/30/2017   Other pruritus 12/27/2017   Eruption cyst 12/19/2017   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 ([place] 11/28/2017   Chronic cough 11/11/2017   Pulmonary hypertension ([place] 11/11/2017   Pulmonary arterial hypertension ([place] 10/07/2017   Acute ITP ([place] 01/05/2017   ESRD (end stage renal disease) ([place] 12/28/2016   Encounter for removal of sutures 09/09/2016   Coagulation defect, unspecified ([place] 09/01/2016   Underimmunization status 08/05/2016   Hemolytic anemia (HCC) 07/26/2016   Epistaxis, recurrent 07/26/2016   Unspecified protein-calorie malnutrition ([facility] 07/13/2016   Aftercare including intermittent dialysis ([facility] 07/07/2016   Anemia in chronic kidney disease 07/07/2016   Hypokalemia 07/07/2016   Iron deficiency anemia, unspecified 07/07/2016   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   Nonrheumatic mitral (valve)  prolapse 07/07/2016   Other irritable bowel syndrome 07/07/2016   Other secondary thrombocytopenia 07/07/2016   Secondary hyperparathyroidism of renal origin ([place]) 07/07/2016   Thrombocytopenia ([place]) 07/01/2016   ARF (acute renal failure) ([place]) 06/25/2016   Anxiety 06/25/2016   Bilateral carpal tunnel syndrome 10/22/2015   Chronic gout without tophus 10/22/2015   Chronic nonintractable headache 10/08/2015   Fibroid uterus 01/03/2012   H/O vitamin D deficiency 01/03/2012   Post-menopausal 01/03/2012   Hereditary and idiopathic peripheral neuropathy 11/19/2011   Intestinal malabsorption 79/43/2761   Lichen planus 47/09/2955   Low back pain 11/19/2011   Diffuse spasm of esophagus 11/11/2011   ESR raised 11/11/2011   Postinflammatory pulmonary fibrosis ([place]) 11/11/2011    Scleroderma ([place]) 11/16/2010   Rheumatoid arthritis ([place]) 11/16/2010   Raynaud's disease 11/16/2010   Symptomatic menopausal or female climacteric states 11/16/2010    Scot Jun, PT, DPT, OCS, ATC 11/14/20  10:50 AM    [facility] Physical Therapy [address] [place], [place], [address] Phone: [phone]   Fax:  [phone]  Name: Katie Nunez MRN: [id number] Date of Birth: [birth year]

## 2020-11-15 DIAGNOSIS — D509 Iron deficiency anemia, unspecified: Secondary | ICD-10-CM | POA: Diagnosis not present

## 2020-11-15 DIAGNOSIS — Z992 Dependence on renal dialysis: Secondary | ICD-10-CM | POA: Diagnosis not present

## 2020-11-15 DIAGNOSIS — D631 Anemia in chronic kidney disease: Secondary | ICD-10-CM | POA: Diagnosis not present

## 2020-11-15 DIAGNOSIS — N186 End stage renal disease: Secondary | ICD-10-CM | POA: Diagnosis not present

## 2020-11-15 DIAGNOSIS — N2581 Secondary hyperparathyroidism of renal origin: Secondary | ICD-10-CM | POA: Diagnosis not present

## 2020-11-17 DIAGNOSIS — R040 Epistaxis: Secondary | ICD-10-CM | POA: Diagnosis not present

## 2020-11-18 DIAGNOSIS — Z992 Dependence on renal dialysis: Secondary | ICD-10-CM | POA: Diagnosis not present

## 2020-11-18 DIAGNOSIS — N2581 Secondary hyperparathyroidism of renal origin: Secondary | ICD-10-CM | POA: Diagnosis not present

## 2020-11-18 DIAGNOSIS — D509 Iron deficiency anemia, unspecified: Secondary | ICD-10-CM | POA: Diagnosis not present

## 2020-11-18 DIAGNOSIS — D631 Anemia in chronic kidney disease: Secondary | ICD-10-CM | POA: Diagnosis not present

## 2020-11-18 DIAGNOSIS — N186 End stage renal disease: Secondary | ICD-10-CM | POA: Diagnosis not present

## 2020-11-19 ENCOUNTER — Ambulatory Visit: Payer: Medicare Other | Admitting: Podiatry

## 2020-11-19 DIAGNOSIS — I270 Primary pulmonary hypertension: Secondary | ICD-10-CM | POA: Diagnosis not present

## 2020-11-19 DIAGNOSIS — R0609 Other forms of dyspnea: Secondary | ICD-10-CM | POA: Diagnosis not present

## 2020-11-19 DIAGNOSIS — N186 End stage renal disease: Secondary | ICD-10-CM | POA: Diagnosis not present

## 2020-11-19 DIAGNOSIS — I619 Nontraumatic intracerebral hemorrhage, unspecified: Secondary | ICD-10-CM | POA: Diagnosis not present

## 2020-11-20 DIAGNOSIS — N2581 Secondary hyperparathyroidism of renal origin: Secondary | ICD-10-CM | POA: Diagnosis not present

## 2020-11-20 DIAGNOSIS — D509 Iron deficiency anemia, unspecified: Secondary | ICD-10-CM | POA: Diagnosis not present

## 2020-11-20 DIAGNOSIS — Z992 Dependence on renal dialysis: Secondary | ICD-10-CM | POA: Diagnosis not present

## 2020-11-20 DIAGNOSIS — D631 Anemia in chronic kidney disease: Secondary | ICD-10-CM | POA: Diagnosis not present

## 2020-11-20 DIAGNOSIS — N186 End stage renal disease: Secondary | ICD-10-CM | POA: Diagnosis not present

## 2020-11-22 DIAGNOSIS — Z992 Dependence on renal dialysis: Secondary | ICD-10-CM | POA: Diagnosis not present

## 2020-11-22 DIAGNOSIS — D509 Iron deficiency anemia, unspecified: Secondary | ICD-10-CM | POA: Diagnosis not present

## 2020-11-22 DIAGNOSIS — N186 End stage renal disease: Secondary | ICD-10-CM | POA: Diagnosis not present

## 2020-11-22 DIAGNOSIS — D631 Anemia in chronic kidney disease: Secondary | ICD-10-CM | POA: Diagnosis not present

## 2020-11-22 DIAGNOSIS — N2581 Secondary hyperparathyroidism of renal origin: Secondary | ICD-10-CM | POA: Diagnosis not present

## 2020-11-24 ENCOUNTER — Telehealth: Payer: Self-pay | Admitting: Rehabilitative and Restorative Service Providers"

## 2020-11-24 ENCOUNTER — Encounter: Payer: Medicare Other | Admitting: Rehabilitative and Restorative Service Providers"

## 2020-11-24 NOTE — Telephone Encounter (Signed)
Called and spoke with patient after no show for appointment.  Pt. Indicated she forgot time and thought it was wed and Friday.  Confirmed next appointment time.  Scot Jun, PT, DPT, OCS, ATC 11/24/20  9:49 AM

## 2020-11-25 DIAGNOSIS — D631 Anemia in chronic kidney disease: Secondary | ICD-10-CM | POA: Diagnosis not present

## 2020-11-25 DIAGNOSIS — D509 Iron deficiency anemia, unspecified: Secondary | ICD-10-CM | POA: Diagnosis not present

## 2020-11-25 DIAGNOSIS — N186 End stage renal disease: Secondary | ICD-10-CM | POA: Diagnosis not present

## 2020-11-25 DIAGNOSIS — Z992 Dependence on renal dialysis: Secondary | ICD-10-CM | POA: Diagnosis not present

## 2020-11-25 DIAGNOSIS — N2581 Secondary hyperparathyroidism of renal origin: Secondary | ICD-10-CM | POA: Diagnosis not present

## 2020-11-26 ENCOUNTER — Other Ambulatory Visit: Payer: Self-pay

## 2020-11-26 ENCOUNTER — Ambulatory Visit (INDEPENDENT_AMBULATORY_CARE_PROVIDER_SITE_OTHER): Payer: Medicare Other | Admitting: Rehabilitative and Restorative Service Providers"

## 2020-11-26 ENCOUNTER — Encounter: Payer: Self-pay | Admitting: Rehabilitative and Restorative Service Providers"

## 2020-11-26 DIAGNOSIS — M6281 Muscle weakness (generalized): Secondary | ICD-10-CM | POA: Diagnosis not present

## 2020-11-26 DIAGNOSIS — M25511 Pain in right shoulder: Secondary | ICD-10-CM

## 2020-11-26 DIAGNOSIS — G8929 Other chronic pain: Secondary | ICD-10-CM

## 2020-11-26 DIAGNOSIS — R293 Abnormal posture: Secondary | ICD-10-CM | POA: Diagnosis not present

## 2020-11-26 DIAGNOSIS — M542 Cervicalgia: Secondary | ICD-10-CM | POA: Diagnosis not present

## 2020-11-26 NOTE — Therapy (Signed)
[facility] [place], [place], [address] Phone: [phone]   Fax:  [phone]  Physical Therapy Treatment  Patient Details  Name: Katie Nunez MRN: [id number] Date of Birth: 1955/03/27 Referring Provider (PT): Marybelle Killings, MD   Encounter Date: 11/26/2020   PT End of Session - 11/26/20 0931     Visit Number 2    Number of Visits 20    Date for PT Re-Evaluation 01/23/21    Authorization Type [employer] Medicare $20 60 visits per year    Progress Note Due on Visit 10    PT Start Time 0930    PT Stop Time 1009    PT Time Calculation (min) 39 min    Activity Tolerance Patient tolerated treatment well    Behavior During Therapy [facility] for tasks assessed/performed             Past Medical History:  Diagnosis Date   Achalasia    Anxiety    Dysplasia of cervix, low grade (CIN 1)    Environmental allergies    "all year long" (12/27/2016)   ESRD (end stage renal disease) on dialysis ([place])    "TTS; [place]" (12/27/2016)   Fibromyalgia    GERD (gastroesophageal reflux disease)    Gout    Hypertension    IBS (irritable bowel syndrome)    MVP (mitral valve prolapse)    RA (rheumatoid arthritis) ([place])    FOLLOWED BY DR. SHANAHAN   Raynaud's disease    Scleroderma ([place])    Seasonal allergies    Thrombocytopenia ([place]) 07/01/2016   Acute fall to 13,000 07/01/16   Tubular adenoma 01/08/2008   CECUM   Vitamin D deficiency     Past Surgical History:  Procedure Laterality Date   ANKLE FRACTURE SURGERY Right    AV FISTULA PLACEMENT Left 06/28/2016   Procedure: left arm ARTERIOVENOUS (AV) FISTULA CREATION;  Surgeon: Rosetta Posner, MD;  Location: [facility] OR;  Service: Vascular;  Laterality: Left;   [place] Left 09/27/2016   Procedure: LEFT UPPER ARM CEPHALIC VEIN TRANSPOSITION;  Surgeon: Rosetta Posner, MD;  Location: [facility] OR;  Service: Vascular;  Laterality: Left;   BREAST BIOPSY     "? side"   [place]     COLONOSCOPY W/ BIOPSIES  01/08/2008   INSERTION OF DIALYSIS CATHETER Right 06/28/2016   Procedure: INSERTION OF DIALYSIS CATHETER, right internal jugular;  Surgeon: Rosetta Posner, MD;  Location: [place];  Service: Vascular;  Laterality: Right;   MYOMECTOMY     NASAL ENDOSCOPY WITH EPISTAXIS CONTROL N/A 12/29/2019   Procedure: NASAL ENDOSCOPY WITH EPISTAXIS CONTROL;  Surgeon: Leta Baptist, MD;  Location: [place];  Service: ENT;  Laterality: N/A;   PELVIC LAPAROSCOPY  2011   superficial thrombophlebitis Left 07-2014    There were no vitals filed for this visit.   Subjective Assessment - 11/26/20 0935     Subjective Pt. indicated feeling primarily tightness and stiffness on Rt side today.  Wanted to try needling.    Pertinent History HTN, RA, IBS, ESRD, anxiety, fibromyalgia    Limitations House hold activities;Sitting    Diagnostic tests xrays neck no anterolistehesis    Patient Stated Goals Reduce pain    Currently in Pain? No/denies    Pain Score 0-No pain    Pain Onset More than a month ago  [facility] PT Assessment - 11/26/20 0001       Assessment   Medical Diagnosis M54.2 (ICD-10-CM) - Neck pain    Referring Provider (PT) Marybelle Killings, MD    Onset Date/Surgical Date 12/12/19    Hand Dominance Right                           [facility] Adult PT Treatment/Exercise - 11/26/20 0001       Exercises   Exercises Neck    Other Exercises  HEP review      Neck Exercises: Seated   Other Seated Exercise scapular retraction 5 sec hold x 10, cervical retraction hold 2-3 seconds x 10    Other Seated Exercise thoracic extension in chair x 10 2-3 second hold, seated rows green 3 x 10      Neck Exercises: Stretches   Other Neck Stretches Rt upper trap 15 sec x 5      Modalities   Modalities Moist Heat      Moist Heat Therapy   Number Minutes Moist Heat 5 Minutes    Moist Heat Location Shoulder   Rt neck     Manual Therapy    Manual therapy comments compression to Rt upper trap              Trigger Point Dry Needling - 11/26/20 0001     Consent Given? Yes    Muscles Treated Head and Neck Upper trapezius   Rt   Upper Trapezius Response Twitch reponse elicited                   PT Education - 11/26/20 1005     Education Details HEP progression    Person(s) Educated Patient    Methods Explanation;Demonstration;Verbal cues;Handout    Comprehension Verbalized understanding;Returned demonstration              PT Short Term Goals - 11/26/20 0951       PT SHORT TERM GOAL #1   Title Patient will demonstrate independent use of home exercise program to maintain progress from in clinic treatments.    Time 3    Period Weeks    Status On-going    Target Date 12/05/20               PT Long Term Goals - 11/14/20 0940       PT LONG TERM GOAL #1   Title Patient will demonstrate/report pain at worst less than or equal to 2/10 to facilitate minimal limitation in daily activity secondary to pain symptoms.    Time 10    Period Weeks    Target Date 01/23/21      PT LONG TERM GOAL #2   Title Patient will demonstrate independent use of home exercise program to facilitate ability to maintain/progress functional gains from skilled physical therapy services.    Time 10    Period Weeks    Status New    Target Date 01/23/21      PT LONG TERM GOAL #3   Title Pt. will demonstrate FOTO outcome > or = 54% to indicated reduced disability due to condition.    Time 10    Period Weeks    Status New    Target Date 01/23/21      PT LONG TERM GOAL #4   Title Patient will demonstrate cervical AROM WFL s symptoms to facilitate daily activity including driving, self care at PLOF s  limitation due to symptoms.    Time 10    Period Weeks    Status New    Target Date 01/23/21      PT LONG TERM GOAL #5   Title Pt. will demonstrate/report ability to sleep s difficulty due to symptoms.    Time 10     Period Weeks    Status New    Target Date 01/23/21      Additional Long Term Goals   Additional Long Term Goals Yes                   Plan - 11/26/20 1001     Clinical Impression Statement Good tolerance to intervention manually and with dry needling today.  Continued emphasis on improved cervicothoracic mobility to gain movement quality and reduced restriction.  Reviewed HEP c occasional cues for improvements.  Continued skilled PT services indicated at this time.    Personal Factors and Comorbidities Comorbidity 3+    Comorbidities HTN, RA, IBS, ESRD, anxiety, fibromyalgia    Examination-Activity Limitations Sleep;Sit;Carry;Lift;Reach Overhead;Dressing;Hygiene/Grooming    Stability/Clinical Decision Making Stable/Uncomplicated    Rehab Potential Good    PT Frequency 2x / week    PT Duration Other (comment)   10weeks   PT Treatment/Interventions ADLs/Self Care Home Management;Cryotherapy;Electrical Stimulation;Iontophoresis 19m/ml Dexamethasone;Moist Heat;Traction;Therapeutic exercise;Balance training;Therapeutic activities;Functional mobility training;Gait training;Stair training;DME Instruction;Ultrasound;Neuromuscular re-education;Patient/family education;Passive range of motion;Spinal Manipulations;Joint Manipulations;Dry needling;Taping;Manual techniques    PT Next Visit Plan DN as desired.  postural strengthening    PT Home Exercise Plan [email or web address]    Consulted and Agree with Plan of Care Patient             Patient will benefit from skilled therapeutic intervention in order to improve the following deficits and impairments:  Abnormal gait, Decreased endurance, Hypomobility, Pain, Decreased strength, Increased fascial restricitons, Decreased activity tolerance, Impaired flexibility, Improper body mechanics, Impaired perceived functional ability, Postural dysfunction, Difficulty walking, Decreased mobility, Decreased balance, Decreased range of motion  Visit  Diagnosis: Cervicalgia  Chronic right shoulder pain  Muscle weakness (generalized)  Abnormal posture     Problem List Patient Active Problem List   Diagnosis Date Noted   Neck pain 10/30/2020   Knee locking, left 10/05/2020   Abnormality of gait 02/25/2020   Protein-calorie malnutrition, severe 01/19/2020   Epistaxis    Sleep disturbance    Slow transit constipation    Hemorrhoids    Chronic systolic congestive heart failure (HCC)    ESRD on dialysis (HCC)    Right hemiparesis (HCC)    Pressure injury of skin 12/20/2019   Intraparenchymal hemorrhage of brain ([place] 12/18/2019   ICH (intracerebral hemorrhage) ([place] 12/12/2019   Viral disease 08/22/2019   Chronic right-sided heart failure ([place] 07/09/2019   Supraventricular tachycardia ([place] 07/09/2019   Other cirrhosis of liver ([place] 06/27/2019   Heart failure ([place] 02/21/2019   Heart palpitations 08/14/2018   Other fluid overload 07/27/2018   Encounter for long-term (current) use of other medications 01/18/2018   Hypothyroidism 01/18/2018   Myalgia and myositis 01/18/2018   Chronic nephritis 01/18/2018   Other long term (current) drug therapy 01/18/2018   Sleep apnea 01/18/2018   Unspecified persistent mental disorders due to conditions classified elsewhere 01/18/2018   Venous reflux 01/18/2018   Vitamin B12 deficiency 01/18/2018   Vitamin D deficiency 01/18/2018   Obstructive lung disease ([place] 12/30/2017   Other pruritus 12/27/2017   Eruption cyst 12/19/2017   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 ([place] 11/28/2017  Chronic cough 11/11/2017   Pulmonary hypertension ([place]) 11/11/2017   Pulmonary arterial hypertension ([place]) 10/07/2017   Acute ITP ([place]) 01/05/2017   ESRD (end stage renal disease) ([place]) 12/28/2016   Encounter for removal of sutures 09/09/2016   Coagulation defect, unspecified ([place]) 09/01/2016   Underimmunization status 08/05/2016   Hemolytic anemia ([place])  07/26/2016   Epistaxis, recurrent 07/26/2016   Unspecified protein-calorie malnutrition ([place]) 07/13/2016   Aftercare including intermittent dialysis ([place]) 07/07/2016   Anemia in chronic kidney disease 07/07/2016   Hypokalemia 07/07/2016   Iron deficiency anemia, unspecified 07/07/2016   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   Nonrheumatic mitral (valve) prolapse 07/07/2016   Other irritable bowel syndrome 07/07/2016   Other secondary thrombocytopenia 07/07/2016   Secondary hyperparathyroidism of renal origin ([place]) 07/07/2016   Thrombocytopenia ([place]) 07/01/2016   ARF (acute renal failure) ([place]) 06/25/2016   Anxiety 06/25/2016   Bilateral carpal tunnel syndrome 10/22/2015   Chronic gout without tophus 10/22/2015   Chronic nonintractable headache 10/08/2015   Fibroid uterus 01/03/2012   H/O vitamin D deficiency 01/03/2012   Post-menopausal 01/03/2012   Hereditary and idiopathic peripheral neuropathy 11/19/2011   Intestinal malabsorption 64/31/4276   Lichen planus 70/11/32   Low back pain 11/19/2011   Diffuse spasm of esophagus 11/11/2011   ESR raised 11/11/2011   Postinflammatory pulmonary fibrosis ([place]) 11/11/2011   Scleroderma ([place]) 11/16/2010   Rheumatoid arthritis ([place]) 11/16/2010   Raynaud's disease 11/16/2010   Symptomatic menopausal or female climacteric states 11/16/2010    Scot Jun, PT, DPT, OCS, ATC 11/26/20  10:06 AM    [facility] [address] [place], [place], [address] Phone: [phone]   Fax:  [phone]  Name: Katie Nunez MRN: [id number] Date of Birth: 24-Sep-1955

## 2020-11-26 NOTE — Patient Instructions (Signed)
Access Code: [email or web address] URL: [email or web address] Date: 11/26/2020 Prepared by: Scot Jun  Exercises Seated Scapular Retraction - 2-3 x daily - 7 x weekly - 1 sets - 10 reps - 5 hold Seated Cervical Retraction - 2-3 x daily - 7 x weekly - 1 sets - 10 reps Seated Upper Trapezius Stretch - 2-3 x daily - 7 x weekly - 1 sets - 5 reps - 15 hold Seated Thoracic Lumbar Extension - 2-3 x daily - 7 x weekly - 1 sets - 10 reps

## 2020-11-27 DIAGNOSIS — D631 Anemia in chronic kidney disease: Secondary | ICD-10-CM | POA: Diagnosis not present

## 2020-11-27 DIAGNOSIS — Z992 Dependence on renal dialysis: Secondary | ICD-10-CM | POA: Diagnosis not present

## 2020-11-27 DIAGNOSIS — N186 End stage renal disease: Secondary | ICD-10-CM | POA: Diagnosis not present

## 2020-11-27 DIAGNOSIS — N2581 Secondary hyperparathyroidism of renal origin: Secondary | ICD-10-CM | POA: Diagnosis not present

## 2020-11-27 DIAGNOSIS — D509 Iron deficiency anemia, unspecified: Secondary | ICD-10-CM | POA: Diagnosis not present

## 2020-11-28 DIAGNOSIS — R278 Other lack of coordination: Secondary | ICD-10-CM | POA: Diagnosis not present

## 2020-11-28 DIAGNOSIS — K5900 Constipation, unspecified: Secondary | ICD-10-CM | POA: Diagnosis not present

## 2020-11-29 DIAGNOSIS — N186 End stage renal disease: Secondary | ICD-10-CM | POA: Diagnosis not present

## 2020-11-29 DIAGNOSIS — Z992 Dependence on renal dialysis: Secondary | ICD-10-CM | POA: Diagnosis not present

## 2020-11-29 DIAGNOSIS — N2581 Secondary hyperparathyroidism of renal origin: Secondary | ICD-10-CM | POA: Diagnosis not present

## 2020-11-29 DIAGNOSIS — D509 Iron deficiency anemia, unspecified: Secondary | ICD-10-CM | POA: Diagnosis not present

## 2020-11-29 DIAGNOSIS — D631 Anemia in chronic kidney disease: Secondary | ICD-10-CM | POA: Diagnosis not present

## 2020-12-01 DIAGNOSIS — D631 Anemia in chronic kidney disease: Secondary | ICD-10-CM | POA: Diagnosis not present

## 2020-12-01 DIAGNOSIS — Z992 Dependence on renal dialysis: Secondary | ICD-10-CM | POA: Diagnosis not present

## 2020-12-01 DIAGNOSIS — D509 Iron deficiency anemia, unspecified: Secondary | ICD-10-CM | POA: Diagnosis not present

## 2020-12-01 DIAGNOSIS — N2581 Secondary hyperparathyroidism of renal origin: Secondary | ICD-10-CM | POA: Diagnosis not present

## 2020-12-01 DIAGNOSIS — N186 End stage renal disease: Secondary | ICD-10-CM | POA: Diagnosis not present

## 2020-12-02 ENCOUNTER — Ambulatory Visit (INDEPENDENT_AMBULATORY_CARE_PROVIDER_SITE_OTHER): Payer: Medicare Other | Admitting: Adult Health

## 2020-12-02 ENCOUNTER — Encounter: Payer: Self-pay | Admitting: Adult Health

## 2020-12-02 VITALS — BP 98/68 | HR 73 | Ht 65.0 in | Wt 128.0 lb

## 2020-12-02 DIAGNOSIS — M79605 Pain in left leg: Secondary | ICD-10-CM

## 2020-12-02 DIAGNOSIS — G8110 Spastic hemiplegia affecting unspecified side: Secondary | ICD-10-CM

## 2020-12-02 DIAGNOSIS — I619 Nontraumatic intracerebral hemorrhage, unspecified: Secondary | ICD-10-CM | POA: Diagnosis not present

## 2020-12-02 DIAGNOSIS — E785 Hyperlipidemia, unspecified: Secondary | ICD-10-CM

## 2020-12-02 DIAGNOSIS — M79604 Pain in right leg: Secondary | ICD-10-CM | POA: Diagnosis not present

## 2020-12-02 NOTE — Patient Instructions (Addendum)
Continue aspirin 81 mg daily  for secondary stroke prevention  We will check lipid panel today  Please let us know if you would like to start PT for balance   Continue to follow up with PCP regarding cholesterol and blood pressure management  Maintain strict control of hypertension with blood pressure goal below 130/90 and cholesterol with LDL cholesterol (bad cholesterol) goal below 70 mg/dL.     Follow up in 6 months or call earlier if needed     Thank you for coming to see [place] at [facility]. I hope we have been able to provide you high quality care today.  You may receive a patient satisfaction survey over the next few weeks. We would appreciate your feedback and comments so that we may continue to improve ourselves and the health of our patients.

## 2020-12-02 NOTE — Progress Notes (Signed)
[facility] [address] [place]. [place] [address] [phone]       STROKE FOLLOW UP NOTE  [name] Date of Birth:  Feb 17, 1955 Medical Record Number:  [id number]   Reason for Referral:  stroke follow up    SUBJECTIVE:   CHIEF COMPLAINT:  Chief Complaint  Patient presents with   Follow-up    RM 3 with spouse Katie Nunez PT is well, doesn't think she is progressing like she should. Having numbness/pain in both feet.      HPI:   Update 12/02/2020 JM: Returns for 41-month stroke follow-up accompanied by her husband  Overall stable since prior visit -denies new stroke/TIA symptoms Residual right spastic hemiparesis - reports stable since prior visit. Some pain RLE distally. She questions possible restart of PT. Occasional use of Flexeril and gabapentin Continues to use a cane for ambulation -denies any recent falls Does report R>L foot numbness typically worse at night - this has been affecting her gait. She does have known scleroderma and questions if these symptoms could be part of that Currently working with PT on right sided neck/shoulder pain with some improvement - followed by orthopedics - considering use of cortisone injection per patient. Chronic issue but worsened post stroke  Compliant on aspirin - denies side effects Does not consistently take atorvastatin - she did not feel this was needed. Denies side effects.  Blood pressure today 98/68 -routinely followed by nephrology and cardiology for blood pressure management  Routinely follows with GI for hepatic cirrhosis - reports recently dx'd with esophageal and rectal varices - has f/u 11/30 to further discuss Routinely follows with [place] cardiology She is also followed by [facility] neurology  No further concerns at this time     History provided for reference purposes only Update 08/04/2020 Dr. Leonie Man She returns for follow-up after last visit with [name] our nurse practitioner 5 months  ago.  She is doing well without recurrent stroke or TIA symptoms.  She continues to have right-sided weakness with is able to walk with a cane and has finished outpatient physical and Occupational Therapy.  She still has mild right foot drop.  She has had no falls or injuries.  She has not started taking aspirin or Eliquis and has questions about her treatment options.  She had a follow-up CT scan of the brain done on 03/25/2020 which showed satisfactory resolution of the left frontal parenchymal hematoma without any acute abnormalities.  She continues to have low blood pressure with the systolic in the 94T ever since her diagnosis and treatment for pulmonary hypertension.  Today it is 80/57 but she denies having significant symptoms of orthostasis, tiredness and fatigue.  Initial visit 02/20/2020 JM: [name] is being seen for hospital follow-up accompanied by her husband. She was discharged home from CIR on 01/23/2020. She has been doing well since discharge without new stroke/TIA symptoms and reports residual right-sided weakness and gait impairment with continued improvement.  Currently working with neuro rehab PT/OT.  She does report increased tone in RUE muscle tightness which is typically worse at night.  She was using Flexeril during CIR with benefit and questions if this could be restarted.  Ambulates with cane and denies any recent falls.  She is not using assistive device PTA but was using rolling walker initially at discharge.  She remains on atorvastatin 40 mg daily without myalgias.  Blood pressure today 133/72.  BP routinely monitored at dialysis.  No further concerns at this time.  Stroke  admission 12/12/2019 [name] is a 65 y.o. female with history of ESRD on HD TTS, fibromyalgia, hypertension, mixed connective tissue disorder, scleroderma, pulmonaryhypertension, atrial fibrillation, cirrhosis-on Coumadin for portal vein thrombosis and atrial fibrillation who presented to [facility] ED on  12/12/2019 with R sided weakness. Personally reviewed hospitalization pertinent progress notes, lab work and imaging with summary provided. Evaluated by Dr. Erlinda Hong with stroke work-up revealing left frontal IPH likely secondary to warfarin coagulopathy. Warfarin was reversed with vitamin K and Kcentra with INR of 1.7 on admission and recommended follow-up outpatient to consider resuming warfarin or consider Eliquis once ICH resolves. History of HTN stable. LDL 79 and recommend consideration of statin at discharge for follow-up in setting of hemorrhage. No history or evidence of DM with A1c 5.2. Other stroke risk factors include ESRD on HD and OSA. Other active problems include anemia of ESRD, metabolic bone disease, pulmonary hypertension, RA, scleroderma and fibromyalgia. Residual deficit of right hemiplegia. Evaluated by therapies and discharged to CIR on 12/18/2019 for ongoing therapy needs.  Stroke:   L frontal IPH d/t warfarin coagulopathy  CT head acute L frontal IPH w/ mild edema Repeat CT head stable L frontal IPH Repeat CT head unchanged L frontal IPH MRI  Subcortical L frontal white matter IPH w/ rim edema. few remote B hemorrhages upper cerebrum. MRA  Unremarkable  2D Echo EF 65 to 70% LDL 79 HgbA1c 5.2 VTE prophylaxis - SCDs  warfarin daily prior to admission, now on No antithrombotic given IPH Therapy recommendations:  CIR Disposition:  CIR       ROS:   14 system review of systems performed and negative with exception of those listed in HPI  PMH:  Past Medical History:  Diagnosis Date   Achalasia    Anxiety    Dysplasia of cervix, low grade (CIN 1)    Environmental allergies    "all year long" (12/27/2016)   ESRD (end stage renal disease) on dialysis ([place])    "TTS; [place]" (12/27/2016)   Fibromyalgia    GERD (gastroesophageal reflux disease)    Gout    Hypertension    IBS (irritable bowel syndrome)    MVP (mitral valve prolapse)    RA (rheumatoid arthritis) ([place])     FOLLOWED BY DR. SHANAHAN   Raynaud's disease    Scleroderma ([place])    Seasonal allergies    Thrombocytopenia ([place]) 07/01/2016   Acute fall to 13,000 07/01/16   Tubular adenoma 01/08/2008   CECUM   Vitamin D deficiency     PSH:  Past Surgical History:  Procedure Laterality Date   ANKLE FRACTURE SURGERY Right    AV FISTULA PLACEMENT Left 06/28/2016   Procedure: left arm ARTERIOVENOUS (AV) FISTULA CREATION;  Surgeon: Rosetta Posner, MD;  Location: [facility] OR;  Service: Vascular;  Laterality: Left;   [place] Left 09/27/2016   Procedure: LEFT UPPER ARM CEPHALIC VEIN TRANSPOSITION;  Surgeon: Rosetta Posner, MD;  Location: [place];  Service: Vascular;  Laterality: Left;   BREAST BIOPSY     "? side"   [place]     COLONOSCOPY W/ BIOPSIES  01/08/2008   INSERTION OF DIALYSIS CATHETER Right 06/28/2016   Procedure: INSERTION OF DIALYSIS CATHETER, right internal jugular;  Surgeon: Rosetta Posner, MD;  Location: [place];  Service: Vascular;  Laterality: Right;   MYOMECTOMY     NASAL ENDOSCOPY WITH EPISTAXIS CONTROL N/A 12/29/2019   Procedure: NASAL ENDOSCOPY  WITH EPISTAXIS CONTROL;  Surgeon: Leta Baptist, MD;  Location: [facility] OR;  Service: ENT;  Laterality: N/A;   PELVIC LAPAROSCOPY  2011   superficial thrombophlebitis Left 07-2014    Social History:  Social History   Socioeconomic History   Marital status: Married    Spouse name: Not on file   Number of children: Not on file   Years of education: Not on file   Highest education level: Not on file  Occupational History   Not on file  Tobacco Use   Smoking status: Never   Smokeless tobacco: Never  Vaping Use   Vaping Use: Never used  Substance and Sexual Activity   Alcohol use: No   Drug use: No   Sexual activity: Not Currently    Birth control/protection: Post-menopausal  Other Topics Concern   Not on file  Social History Narrative   Lives in [place]   Social Determinants of Health    Financial Resource Strain: Not on file  Food Insecurity: Not on file  Transportation Needs: Not on file  Physical Activity: Not on file  Stress: Not on file  Social Connections: Not on file  Intimate Partner Violence: Not on file    Family History:  Family History  Problem Relation Age of Onset   Hypertension Mother    Diabetes Mother    Heart disease Father    Hypertension Maternal Aunt    Diabetes Maternal Grandmother    Heart disease Paternal Grandfather    Cerebral palsy Cousin        1ST COUSIN?   Diabetes Paternal Grandmother     Medications:   Current Outpatient Medications on File Prior to Visit  Medication Sig Dispense Refill   ambrisentan (LETAIRIS) 5 MG tablet Take 5 mg by mouth daily.     aspirin EC 81 MG tablet Take 1 tablet (81 mg total) by mouth daily. Swallow whole. 30 tablet 11   atorvastatin (LIPITOR) 40 MG tablet Take 1 tablet (40 mg total) by mouth daily. 90 tablet 3   azelastine (ASTELIN) 0.1 % nasal spray Place 1 spray into both nostrils 2 (two) times daily as needed for rhinitis. Use in each nostril as directed 30 mL 12   calcium acetate (PHOSLO) 667 MG capsule Take by mouth.     camphor-menthol (SARNA) lotion Apply topically 2 (two) times daily. (Patient taking differently: Apply topically as needed.) 222 mL 0   clonazePAM (KLONOPIN) 0.5 MG tablet Take 1 tablet (0.5 mg total) by mouth 2 (two) times daily as needed for anxiety. 30 tablet 0   cyclobenzaprine (FLEXERIL) 5 MG tablet Take 1 tablet (5 mg total) by mouth every 8 (eight) hours as needed for muscle spasms. 60 tablet 4   docusate sodium (COLACE) 100 MG capsule Take 2 capsules (200 mg total) by mouth daily. (Patient taking differently: Take 200 mg by mouth as needed.) 60 capsule 0   famotidine (PEPCID) 20 MG tablet Take 1 tablet (20 mg total) by mouth daily as needed for heartburn or indigestion. (Patient taking differently: Take 40 mg by mouth 2 (two) times daily.) 30 tablet 0   gabapentin  (NEURONTIN) 100 MG capsule Take 1 capsule (100 mg total) by mouth 2 (two) times daily as needed. 60 capsule 0   hydrocerin (EUCERIN) CREA Apply 1 application topically 2 (two) times daily. To dry skin--avoid IV site 454 g 0   hydrocortisone (ANUSOL-HC) 2.5 % rectal cream Place rectally 2 (two) times daily as needed for hemorrhoids or anal itching.  30 g 0   hydrOXYzine (ATARAX/VISTARIL) 25 MG tablet Take 25 mg by mouth every 8 (eight) hours as needed for itching.      iron sucrose in sodium chloride 0.9 % 100 mL Iron Sucrose (Venofer)     levocetirizine (XYZAL) 5 MG tablet SMARTSIG:1 Tablet(s) By Mouth Every Evening     lidocaine-prilocaine (EMLA) cream Apply 1 application topically as needed. Apply to toes as needed for pain 30 g 3   lubiprostone (AMITIZA) 8 MCG capsule Take 8 mcg by mouth 2 (two) times daily with a meal.     menthol-cetylpyridinium (CEPACOL) 3 MG lozenge Take 1 lozenge (3 mg total) by mouth as needed for sore throat. 100 tablet 12   Methoxy PEG-Epoetin Beta (MIRCERA IJ) Mircera     multivitamin (RENA-VIT) TABS tablet Take 1 tablet by mouth daily at 6 (six) AM. 30 tablet 0   oxymetazoline (AFRIN) 0.05 % nasal spray Place 1 spray into both nostrils 2 (two) times daily as needed for congestion. 30 mL 0   pentoxifylline (TRENTAL) 400 MG CR tablet Take 1 tablet (400 mg total) by mouth daily. 30 tablet 0   Selexipag (UPTRAVI) 800 MCG TABS Take 1 tablet (800 mcg total) by mouth in the morning and at bedtime. 60 tablet 0   sodium chloride (OCEAN) 0.65 % SOLN nasal spray Place 1 spray into both nostrils 5 (five) times daily. At least 5 times a day  0   No current facility-administered medications on file prior to visit.    Allergies:   Allergies  Allergen Reactions   Other Anaphylaxis and Other (See Comments)    Do not use polyflux membrane.  Use alternate Other reaction(s): heart racing   Savella [Milnacipran Hcl] Palpitations and Other (See Comments)    Unknown   Tape Rash and  Other (See Comments)    Itch- unsure if it was paper or adhesive tape      OBJECTIVE:  Physical Exam  Vitals:   12/02/20 0837  BP: 98/68  Pulse: 73  Weight: 128 lb (58.1 kg)  Height: 5\' 5"  (1.651 m)   Body mass index is 21.3 kg/m. No results found.  General: Frail very pleasant middle-aged African-American female, seated, in no evident distress Head: head normocephalic and atraumatic.   Neck: supple with no carotid or supraclavicular bruits Cardiovascular: regular rate and rhythm, no murmurs Musculoskeletal: no deformity Skin:  no rash/petichiae; +bruit and +thrill LUE fistula Vascular:  Normal pulses all extremities   Neurologic Exam Mental Status: Awake and fully alert.  Fluent speech and language. Oriented to place and time. Recent and remote memory intact. Attention span, concentration and fund of knowledge appropriate. Mood and affect appropriate.  Cranial Nerves: Pupils equal, briskly reactive to light. Extraocular movements full without nystagmus. Visual fields full to confrontation. Hearing intact. Facial sensation intact. Face, tongue, palate moves normally and symmetrically.  Motor: Normal bulk and tone and strength LUE and LLE.  RUE: 4/5 with slightly decreased hand dexterity and increased tone; RLE: 4/5 hip flexor and ankle dorsiflexion and mildly increased tone distally Sensory.: intact to touch , pinprick , position and vibratory sensation.  Coordination: Rapid alternating movements normal in all extremities except right hand. Finger-to-nose and heel-to-shin performed accurately bilaterally Gait and Station: Arises from chair without difficulty. Stance is normal. Gait demonstrates mild spastic hemiplegic gait with decreased RLE step height with use of cane.  Tandem walk and heel toe not attempted Reflexes: 2+ RUE and RLE; 1+ LUE and LLE. Toes downgoing.  ASSESSMENT: Katie Nunez is a 65 y.o. year old female presented with right-sided weakness on  12/12/2019 with stroke work-up revealing left frontal IPH secondary to warfarin coagulopathy. Vascular risk factors include HTN, HLD, atrial fibrillation s/p ablation, portal vein thrombosis, pulmonary hypertension, rheumatoid arthritis, chronic right-sided heart failure, scleroderma and ESRD on HD.      PLAN:  L frontal IPH:  Residual deficit: Mild spastic right hemiparesis. Gait impairment likely multifactorial - she wishes to restart PT for gait but advised she will need to complete current PT for cervicalgia prior to proceeding.  Continue participation with neuro rehab PT/OT for hopeful further recovery.  Continue Flexeril as needed Repeat CT head 03/15/2020 resolution of prior hemorrhage Continue aspirin 81 mg daily for secondary stroke prevention.  Discussed indication for lipid management - will recheck lipids today and go from there Discussed secondary stroke prevention measures and importance of close PCP follow up for aggressive stroke risk factor management  Atrial fibrillation: s/p ablation 09/2019.  Not AC candidate.  Followed by [facility] cardiology HTN: BP goal <130/90.  Stable today managed by nephrology and cardiology HLD: LDL goal <70.  Prior LDL 79.  Continue use of atorvastatin 40 mg daily.  Repeat lipid panel today LE pains: chronic. likely multifactorial with complicated medical history as above as well hx of LLE cellulitis 09/2020 s/p I&Dx3. Reports previously completing EMG/NCV (unable to appreciate via epic). No indication for further evaluation at this present time    Follow up in 6 months with Dr. Leonie Man per pt request or call earlier if needed   CC:  [place] provider: Dr. Darden Dates, Hal Hope, MD    I spent 42 minutes of face-to-face and non-face-to-face time with patient and husband.  This included previsit chart review, lab review, study review, order entry, electronic health record documentation, patient and husband education and discussion regarding history of prior stroke  and residual deficits, secondary stroke prevention measures and aggressive stroke risk factor management, LE pains and possible etiology and answered all the questions to patient and husband satisfaction  Frann Rider, AGNP-BC  [facility] [address] [place], [place] [address]  Phone [phone] Fax [phone] Note: This document was prepared with digital dictation and possible smart phrase technology. Any transcriptional errors that result from this process are unintentional.

## 2020-12-03 DIAGNOSIS — Z992 Dependence on renal dialysis: Secondary | ICD-10-CM | POA: Diagnosis not present

## 2020-12-03 DIAGNOSIS — D631 Anemia in chronic kidney disease: Secondary | ICD-10-CM | POA: Diagnosis not present

## 2020-12-03 DIAGNOSIS — N2581 Secondary hyperparathyroidism of renal origin: Secondary | ICD-10-CM | POA: Diagnosis not present

## 2020-12-03 DIAGNOSIS — D509 Iron deficiency anemia, unspecified: Secondary | ICD-10-CM | POA: Diagnosis not present

## 2020-12-03 DIAGNOSIS — N186 End stage renal disease: Secondary | ICD-10-CM | POA: Diagnosis not present

## 2020-12-03 LAB — LIPID PANEL
Chol/HDL Ratio: 2.2 ratio (ref 0.0–4.4)
Cholesterol, Total: 186 mg/dL (ref 100–199)
HDL: 84 mg/dL (ref >39)
LDL Chol Calc (NIH): 86 mg/dL (ref 0–99)
Triglycerides: 92 mg/dL (ref 0–149)
VLDL Cholesterol Cal: 16 mg/dL (ref 5–40)

## 2020-12-05 NOTE — Progress Notes (Signed)
I agree with the above plan 

## 2020-12-06 DIAGNOSIS — D631 Anemia in chronic kidney disease: Secondary | ICD-10-CM | POA: Diagnosis not present

## 2020-12-06 DIAGNOSIS — Z992 Dependence on renal dialysis: Secondary | ICD-10-CM | POA: Diagnosis not present

## 2020-12-06 DIAGNOSIS — D509 Iron deficiency anemia, unspecified: Secondary | ICD-10-CM | POA: Diagnosis not present

## 2020-12-06 DIAGNOSIS — N2581 Secondary hyperparathyroidism of renal origin: Secondary | ICD-10-CM | POA: Diagnosis not present

## 2020-12-06 DIAGNOSIS — N186 End stage renal disease: Secondary | ICD-10-CM | POA: Diagnosis not present

## 2020-12-08 ENCOUNTER — Encounter: Payer: Self-pay | Admitting: Adult Health

## 2020-12-08 DIAGNOSIS — R278 Other lack of coordination: Secondary | ICD-10-CM | POA: Diagnosis not present

## 2020-12-08 DIAGNOSIS — I10 Essential (primary) hypertension: Secondary | ICD-10-CM | POA: Diagnosis not present

## 2020-12-08 DIAGNOSIS — M349 Systemic sclerosis, unspecified: Secondary | ICD-10-CM | POA: Diagnosis not present

## 2020-12-08 DIAGNOSIS — N186 End stage renal disease: Secondary | ICD-10-CM | POA: Diagnosis not present

## 2020-12-08 DIAGNOSIS — I7090 Unspecified atherosclerosis: Secondary | ICD-10-CM | POA: Diagnosis not present

## 2020-12-08 DIAGNOSIS — Z992 Dependence on renal dialysis: Secondary | ICD-10-CM | POA: Diagnosis not present

## 2020-12-08 NOTE — Telephone Encounter (Signed)
Per stroke recommendations, tighter control of cholesterol is recommended therefore LDL or bad cholesterol goal is less than 70.

## 2020-12-09 DIAGNOSIS — D509 Iron deficiency anemia, unspecified: Secondary | ICD-10-CM | POA: Diagnosis not present

## 2020-12-09 DIAGNOSIS — D631 Anemia in chronic kidney disease: Secondary | ICD-10-CM | POA: Diagnosis not present

## 2020-12-09 DIAGNOSIS — N186 End stage renal disease: Secondary | ICD-10-CM | POA: Diagnosis not present

## 2020-12-09 DIAGNOSIS — N2581 Secondary hyperparathyroidism of renal origin: Secondary | ICD-10-CM | POA: Diagnosis not present

## 2020-12-09 DIAGNOSIS — Z992 Dependence on renal dialysis: Secondary | ICD-10-CM | POA: Diagnosis not present

## 2020-12-10 DIAGNOSIS — N269 Renal sclerosis, unspecified: Secondary | ICD-10-CM | POA: Diagnosis not present

## 2020-12-10 DIAGNOSIS — Z992 Dependence on renal dialysis: Secondary | ICD-10-CM | POA: Diagnosis not present

## 2020-12-10 DIAGNOSIS — N186 End stage renal disease: Secondary | ICD-10-CM | POA: Diagnosis not present

## 2020-12-11 DIAGNOSIS — N2581 Secondary hyperparathyroidism of renal origin: Secondary | ICD-10-CM | POA: Diagnosis not present

## 2020-12-11 DIAGNOSIS — Z992 Dependence on renal dialysis: Secondary | ICD-10-CM | POA: Diagnosis not present

## 2020-12-11 DIAGNOSIS — D631 Anemia in chronic kidney disease: Secondary | ICD-10-CM | POA: Diagnosis not present

## 2020-12-11 DIAGNOSIS — D509 Iron deficiency anemia, unspecified: Secondary | ICD-10-CM | POA: Diagnosis not present

## 2020-12-11 DIAGNOSIS — N186 End stage renal disease: Secondary | ICD-10-CM | POA: Diagnosis not present

## 2020-12-12 DIAGNOSIS — I69351 Hemiplegia and hemiparesis following cerebral infarction affecting right dominant side: Secondary | ICD-10-CM | POA: Diagnosis not present

## 2020-12-12 DIAGNOSIS — N186 End stage renal disease: Secondary | ICD-10-CM | POA: Diagnosis not present

## 2020-12-12 DIAGNOSIS — Z79899 Other long term (current) drug therapy: Secondary | ICD-10-CM | POA: Diagnosis not present

## 2020-12-12 DIAGNOSIS — I2721 Secondary pulmonary arterial hypertension: Secondary | ICD-10-CM | POA: Diagnosis not present

## 2020-12-12 DIAGNOSIS — K7460 Unspecified cirrhosis of liver: Secondary | ICD-10-CM | POA: Diagnosis not present

## 2020-12-12 DIAGNOSIS — M359 Systemic involvement of connective tissue, unspecified: Secondary | ICD-10-CM | POA: Diagnosis not present

## 2020-12-12 DIAGNOSIS — D693 Immune thrombocytopenic purpura: Secondary | ICD-10-CM | POA: Diagnosis not present

## 2020-12-12 DIAGNOSIS — Z992 Dependence on renal dialysis: Secondary | ICD-10-CM | POA: Diagnosis not present

## 2020-12-12 DIAGNOSIS — I519 Heart disease, unspecified: Secondary | ICD-10-CM | POA: Diagnosis not present

## 2020-12-12 DIAGNOSIS — M349 Systemic sclerosis, unspecified: Secondary | ICD-10-CM | POA: Diagnosis not present

## 2020-12-13 DIAGNOSIS — D509 Iron deficiency anemia, unspecified: Secondary | ICD-10-CM | POA: Diagnosis not present

## 2020-12-13 DIAGNOSIS — N2581 Secondary hyperparathyroidism of renal origin: Secondary | ICD-10-CM | POA: Diagnosis not present

## 2020-12-13 DIAGNOSIS — D631 Anemia in chronic kidney disease: Secondary | ICD-10-CM | POA: Diagnosis not present

## 2020-12-13 DIAGNOSIS — Z992 Dependence on renal dialysis: Secondary | ICD-10-CM | POA: Diagnosis not present

## 2020-12-13 DIAGNOSIS — N186 End stage renal disease: Secondary | ICD-10-CM | POA: Diagnosis not present

## 2020-12-15 ENCOUNTER — Encounter: Payer: Self-pay | Admitting: Rehabilitative and Restorative Service Providers"

## 2020-12-15 ENCOUNTER — Other Ambulatory Visit: Payer: Self-pay

## 2020-12-15 ENCOUNTER — Ambulatory Visit (INDEPENDENT_AMBULATORY_CARE_PROVIDER_SITE_OTHER): Payer: Medicare Other | Admitting: Rehabilitative and Restorative Service Providers"

## 2020-12-15 DIAGNOSIS — G8929 Other chronic pain: Secondary | ICD-10-CM | POA: Diagnosis not present

## 2020-12-15 DIAGNOSIS — R293 Abnormal posture: Secondary | ICD-10-CM

## 2020-12-15 DIAGNOSIS — M25511 Pain in right shoulder: Secondary | ICD-10-CM | POA: Diagnosis not present

## 2020-12-15 DIAGNOSIS — M542 Cervicalgia: Secondary | ICD-10-CM

## 2020-12-15 DIAGNOSIS — M6281 Muscle weakness (generalized): Secondary | ICD-10-CM | POA: Diagnosis not present

## 2020-12-15 NOTE — Therapy (Addendum)
[place], [place], [address] Phone: [phone]   Fax:  [phone]  Physical Therapy Treatment /Discharge  Patient Details  Name: Katie Nunez MRN: [id number] Date of Birth: [birth year] Referring Provider (PT): Marybelle Killings, MD   Encounter Date: 12/15/2020   PT End of Session - 12/15/20 1024     Visit Number 3    Number of Visits 20    Date for PT Re-Evaluation 01/23/21    Authorization Type [employer] Medicare $20 60 visits per year    Progress Note Due on Visit 10    PT Start Time 1021    PT Stop Time 1100    PT Time Calculation (min) 39 min    Activity Tolerance Patient tolerated treatment well    Behavior During Therapy [facility] for tasks assessed/performed             Past Medical History:  Diagnosis Date   Achalasia    Anxiety    Dysplasia of cervix, low grade (CIN 1)    Environmental allergies    "all year long" (12/27/2016)   ESRD (end stage renal disease) on dialysis ([place])    "TTS; [place]" (12/27/2016)   Fibromyalgia    GERD (gastroesophageal reflux disease)    Gout    Hypertension    IBS (irritable bowel syndrome)    MVP (mitral valve prolapse)    RA (rheumatoid arthritis) ([place])    FOLLOWED BY DR. SHANAHAN   Raynaud's disease    Scleroderma ([place])    Seasonal allergies    Thrombocytopenia ([place]) 07/01/2016   Acute fall to 13,000 07/01/16   Tubular adenoma 01/08/2008   CECUM   Vitamin D deficiency     Past Surgical History:  Procedure Laterality Date   ANKLE FRACTURE SURGERY Right    AV FISTULA PLACEMENT Left 06/28/2016   Procedure: left arm ARTERIOVENOUS (AV) FISTULA CREATION;  Surgeon: Rosetta Posner, MD;  Location: [facility] OR;  Service: Vascular;  Laterality: Left;   [place] Left 09/27/2016   Procedure: LEFT UPPER ARM CEPHALIC VEIN TRANSPOSITION;  Surgeon: Rosetta Posner, MD;  Location: [facility] OR;  Service: Vascular;  Laterality: Left;   BREAST BIOPSY     "? side"   [place]     COLONOSCOPY W/ BIOPSIES  01/08/2008   INSERTION OF DIALYSIS CATHETER Right 06/28/2016   Procedure: INSERTION OF DIALYSIS CATHETER, right internal jugular;  Surgeon: Rosetta Posner, MD;  Location: [place];  Service: Vascular;  Laterality: Right;   MYOMECTOMY     NASAL ENDOSCOPY WITH EPISTAXIS CONTROL N/A 12/29/2019   Procedure: NASAL ENDOSCOPY WITH EPISTAXIS CONTROL;  Surgeon: Leta Baptist, MD;  Location: [facility];  Service: ENT;  Laterality: N/A;   PELVIC LAPAROSCOPY  2011   superficial thrombophlebitis Left 07-2014    There were no vitals filed for this visit.   Subjective Assessment - 12/15/20 1023     Subjective Pt. stated feeling like last time helped some.  Pt. indicated pain up to 5/10.    Pertinent History HTN, RA, IBS, ESRD, anxiety, fibromyalgia    Limitations House hold activities;Sitting    Diagnostic tests xrays neck no anterolistehesis    Patient Stated Goals Reduce pain    Currently in Pain? No/denies    Pain Score --   5/10 at worst   Pain Location Neck    Pain Orientation Right    Pain Descriptors / Indicators Tightness;Constant  Pain Type Chronic pain    Pain Onset More than a month ago    Pain Frequency Constant    Aggravating Factors  tightness overall                [facility] PT Assessment - 12/15/20 0001       Assessment   Medical Diagnosis M54.2 (ICD-10-CM) - Neck pain    Referring Provider (PT) Marybelle Killings, MD    Onset Date/Surgical Date 12/12/19    Hand Dominance Right      AROM   Cervical Extension 53    Cervical - Right Rotation 66    Cervical - Left Rotation 59                           [facility] Adult PT Treatment/Exercise - 12/15/20 0001       Neck Exercises: Theraband   Rows 20 reps;Green   seated   Other Theraband Exercises tband bilateral ER at same time c elbows at side red band 20x      Neck Exercises: Seated   Other Seated Exercise scapular retraction 5 sec hold x 5, cervical  retraction hold 2-3 seconds x 5      Neck Exercises: Supine   Other Supine Exercise supine horizontal abduction red band (30 deg bed elevation) 2 x 10      Neck Exercises: Stretches   Other Neck Stretches Rt upper trap 15 sec x 5      Moist Heat Therapy   Number Minutes Moist Heat 5 Minutes    Moist Heat Location Shoulder   Rt neck : performed c verbal review of presentation and HEP     Manual Therapy   Manual therapy comments compression c movement to Rt upper trap              Trigger Point Dry Needling - 12/15/20 0001     Consent Given? Yes    Education Handout Provided Previously provided    Muscles Treated Head and Neck Upper trapezius    Upper Trapezius Response Twitch reponse elicited                   PT Education - 12/15/20 1054     Education Details HEP band inclusion    Person(s) Educated Patient    Methods Explanation;Demonstration;Verbal cues;Handout    Comprehension Returned demonstration;Verbalized understanding              PT Short Term Goals - 12/15/20 1031       PT SHORT TERM GOAL #1   Title Patient will demonstrate independent use of home exercise program to maintain progress from in clinic treatments.    Time 3    Period Weeks    Status Achieved    Target Date 12/05/20               PT Long Term Goals - 12/15/20 1031       PT LONG TERM GOAL #1   Title Patient will demonstrate/report pain at worst less than or equal to 2/10 to facilitate minimal limitation in daily activity secondary to pain symptoms.    Time 10    Period Weeks    Status On-going    Target Date 01/23/21      PT LONG TERM GOAL #2   Title Patient will demonstrate independent use of home exercise program to facilitate ability to maintain/progress functional gains from skilled physical therapy services.  Time 10    Period Weeks    Status On-going    Target Date 01/23/21      PT LONG TERM GOAL #3   Title Pt. will demonstrate FOTO outcome > or =  54% to indicated reduced disability due to condition.    Time 10    Period Weeks    Status On-going    Target Date 01/23/21      PT LONG TERM GOAL #4   Title Patient will demonstrate cervical AROM WFL s symptoms to facilitate daily activity including driving, self care at PLOF s limitation due to symptoms.    Time 10    Period Weeks    Status On-going    Target Date 01/23/21      PT LONG TERM GOAL #5   Title Pt. will demonstrate/report ability to sleep s difficulty due to symptoms.    Time 10    Period Weeks    Status On-going    Target Date 01/23/21                   Plan - 12/15/20 1101     Clinical Impression Statement Tightness improving as assessed c ROM and palpation but still noted as well as gross Rt shoulder/upper extremity weakness.  Continued skilled PT services warrented.    Personal Factors and Comorbidities Comorbidity 3+    Comorbidities HTN, RA, IBS, ESRD, anxiety, fibromyalgia    Examination-Activity Limitations Sleep;Sit;Carry;Lift;Reach Overhead;Dressing;Hygiene/Grooming    Stability/Clinical Decision Making Stable/Uncomplicated    Rehab Potential Good    PT Frequency 2x / week    PT Duration Other (comment)   10weeks   PT Treatment/Interventions ADLs/Self Care Home Management;Cryotherapy;Electrical Stimulation;Iontophoresis 17m/ml Dexamethasone;Moist Heat;Traction;Therapeutic exercise;Balance training;Therapeutic activities;Functional mobility training;Gait training;Stair training;DME Instruction;Ultrasound;Neuromuscular re-education;Patient/family education;Passive range of motion;Spinal Manipulations;Joint Manipulations;Dry needling;Taping;Manual techniques    PT Next Visit Plan DN as desired.  postural strengthening, FOTO reassessment.    PT Home Exercise Plan [email or web address]    Consulted and Agree with Plan of Care Patient             Patient will benefit from skilled therapeutic intervention in order to improve the following deficits and  impairments:  Abnormal gait, Decreased endurance, Hypomobility, Pain, Decreased strength, Increased fascial restricitons, Decreased activity tolerance, Impaired flexibility, Improper body mechanics, Impaired perceived functional ability, Postural dysfunction, Difficulty walking, Decreased mobility, Decreased balance, Decreased range of motion  Visit Diagnosis: Cervicalgia  Chronic right shoulder pain  Muscle weakness (generalized)  Abnormal posture     Problem List Patient Active Problem List   Diagnosis Date Noted   Neck pain 10/30/2020   Knee locking, left 10/05/2020   Abnormality of gait 02/25/2020   Protein-calorie malnutrition, severe 01/19/2020   Epistaxis    Sleep disturbance    Slow transit constipation    Hemorrhoids    Chronic systolic congestive heart failure ([facility]    ESRD on dialysis ([place]    Right hemiparesis ([facility]    Pressure injury of skin 12/20/2019   Intraparenchymal hemorrhage of brain ([facility] 12/18/2019   ICH (intracerebral hemorrhage) ([facility] 12/12/2019   Viral disease 08/22/2019   Chronic right-sided heart failure ([facility] 07/09/2019   Supraventricular tachycardia ([facility] 07/09/2019   Other cirrhosis of liver ([facility] 06/27/2019   Heart failure ([facility] 02/21/2019   Heart palpitations 08/14/2018   Other fluid overload 07/27/2018   Encounter for long-term (current) use of other medications 01/18/2018   Hypothyroidism 01/18/2018   Myalgia and myositis 01/18/2018   Chronic nephritis 01/18/2018   Other  long term (current) drug therapy 01/18/2018   Sleep apnea 01/18/2018   Unspecified persistent mental disorders due to conditions classified elsewhere 01/18/2018   Venous reflux 01/18/2018   Vitamin B12 deficiency 01/18/2018   Vitamin D deficiency 01/18/2018   Obstructive lung disease ([place]) 12/30/2017   Other pruritus 12/27/2017   Eruption cyst 12/19/2017   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 ([place]) 11/28/2017   Chronic cough  11/11/2017   Pulmonary hypertension ([place]) 11/11/2017   Pulmonary arterial hypertension ([place]) 10/07/2017   Acute ITP ([place]) 01/05/2017   ESRD (end stage renal disease) ([place]) 12/28/2016   Encounter for removal of sutures 09/09/2016   Coagulation defect, unspecified ([place]) 09/01/2016   Underimmunization status 08/05/2016   Hemolytic anemia ([place]) 07/26/2016   Epistaxis, recurrent 07/26/2016   Unspecified protein-calorie malnutrition ([place]) 07/13/2016   Aftercare including intermittent dialysis ([place]) 07/07/2016   Anemia in chronic kidney disease 07/07/2016   Hypokalemia 07/07/2016   Iron deficiency anemia, unspecified 07/07/2016   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   Nonrheumatic mitral (valve) prolapse 07/07/2016   Other irritable bowel syndrome 07/07/2016   Other secondary thrombocytopenia 07/07/2016   Secondary hyperparathyroidism of renal origin ([place]) 07/07/2016   Thrombocytopenia ([place]) 07/01/2016   ARF (acute renal failure) ([place]) 06/25/2016   Anxiety 06/25/2016   Bilateral carpal tunnel syndrome 10/22/2015   Chronic gout without tophus 10/22/2015   Chronic nonintractable headache 10/08/2015   Fibroid uterus 01/03/2012   H/O vitamin D deficiency 01/03/2012   Post-menopausal 01/03/2012   Hereditary and idiopathic peripheral neuropathy 11/19/2011   Intestinal malabsorption 92/92/4462   Lichen planus 86/38/1771   Low back pain 11/19/2011   Diffuse spasm of esophagus 11/11/2011   ESR raised 11/11/2011   Postinflammatory pulmonary fibrosis ([place]) 11/11/2011   Scleroderma ([place]) 11/16/2010   Rheumatoid arthritis ([place]) 11/16/2010   Raynaud's disease 11/16/2010   Symptomatic menopausal or female climacteric states 11/16/2010    Scot Jun, PT, DPT, OCS, ATC 12/15/20  11:02 AM  PHYSICAL THERAPY DISCHARGE SUMMARY  Visits from Start of Care: 3  Current functional level related to goals / functional outcomes: See note   Remaining deficits: See note   Education / Equipment: HEP    Patient agrees to discharge. Patient goals were partially met. Patient is being discharged due to not returning since the last visit. Scot Jun, PT, DPT, OCS, ATC 02/10/21  9:15 AM     [facility] Physical Therapy [address] [place], [place], [address] Phone: [phone]   Fax:  [phone]  Name: Katie Nunez MRN: [id number] Date of Birth: 11/03/1955

## 2020-12-15 NOTE — Patient Instructions (Signed)
Access Code: [email or web address] URL: [email or web address] Date: 12/15/2020 Prepared by: Scot Jun  Exercises Seated Scapular Retraction - 2-3 x daily - 7 x weekly - 1 sets - 10 reps - 5 hold Seated Cervical Retraction - 2-3 x daily - 7 x weekly - 1 sets - 10 reps Seated Upper Trapezius Stretch - 2-3 x daily - 7 x weekly - 1 sets - 5 reps - 15 hold Seated Thoracic Lumbar Extension - 2-3 x daily - 7 x weekly - 1 sets - 10 reps Standing Shoulder Row with Anchored Resistance - 1 x daily - 7 x weekly - 3 sets - 10 reps Shoulder External Rotation and Scapular Retraction with Resistance - 1 x daily - 7 x weekly - 3 sets - 10 reps Supine Shoulder Horizontal Abduction with Resistance - 1 x daily - 7 x weekly - 3 sets - 10 reps

## 2020-12-16 DIAGNOSIS — N186 End stage renal disease: Secondary | ICD-10-CM | POA: Diagnosis not present

## 2020-12-16 DIAGNOSIS — N2581 Secondary hyperparathyroidism of renal origin: Secondary | ICD-10-CM | POA: Diagnosis not present

## 2020-12-16 DIAGNOSIS — D631 Anemia in chronic kidney disease: Secondary | ICD-10-CM | POA: Diagnosis not present

## 2020-12-16 DIAGNOSIS — D509 Iron deficiency anemia, unspecified: Secondary | ICD-10-CM | POA: Diagnosis not present

## 2020-12-16 DIAGNOSIS — Z992 Dependence on renal dialysis: Secondary | ICD-10-CM | POA: Diagnosis not present

## 2020-12-17 ENCOUNTER — Encounter: Payer: Medicare Other | Admitting: Rehabilitative and Restorative Service Providers"

## 2020-12-17 DIAGNOSIS — R040 Epistaxis: Secondary | ICD-10-CM | POA: Diagnosis not present

## 2020-12-18 DIAGNOSIS — N2581 Secondary hyperparathyroidism of renal origin: Secondary | ICD-10-CM | POA: Diagnosis not present

## 2020-12-18 DIAGNOSIS — Z992 Dependence on renal dialysis: Secondary | ICD-10-CM | POA: Diagnosis not present

## 2020-12-18 DIAGNOSIS — D631 Anemia in chronic kidney disease: Secondary | ICD-10-CM | POA: Diagnosis not present

## 2020-12-18 DIAGNOSIS — N186 End stage renal disease: Secondary | ICD-10-CM | POA: Diagnosis not present

## 2020-12-18 DIAGNOSIS — D509 Iron deficiency anemia, unspecified: Secondary | ICD-10-CM | POA: Diagnosis not present

## 2020-12-19 DIAGNOSIS — I270 Primary pulmonary hypertension: Secondary | ICD-10-CM | POA: Diagnosis not present

## 2020-12-19 DIAGNOSIS — N186 End stage renal disease: Secondary | ICD-10-CM | POA: Diagnosis not present

## 2020-12-19 DIAGNOSIS — R0609 Other forms of dyspnea: Secondary | ICD-10-CM | POA: Diagnosis not present

## 2020-12-20 DIAGNOSIS — D631 Anemia in chronic kidney disease: Secondary | ICD-10-CM | POA: Diagnosis not present

## 2020-12-20 DIAGNOSIS — Z992 Dependence on renal dialysis: Secondary | ICD-10-CM | POA: Diagnosis not present

## 2020-12-20 DIAGNOSIS — N186 End stage renal disease: Secondary | ICD-10-CM | POA: Diagnosis not present

## 2020-12-20 DIAGNOSIS — N2581 Secondary hyperparathyroidism of renal origin: Secondary | ICD-10-CM | POA: Diagnosis not present

## 2020-12-20 DIAGNOSIS — D509 Iron deficiency anemia, unspecified: Secondary | ICD-10-CM | POA: Diagnosis not present

## 2020-12-23 DIAGNOSIS — D509 Iron deficiency anemia, unspecified: Secondary | ICD-10-CM | POA: Diagnosis not present

## 2020-12-23 DIAGNOSIS — N186 End stage renal disease: Secondary | ICD-10-CM | POA: Diagnosis not present

## 2020-12-23 DIAGNOSIS — Z992 Dependence on renal dialysis: Secondary | ICD-10-CM | POA: Diagnosis not present

## 2020-12-23 DIAGNOSIS — D631 Anemia in chronic kidney disease: Secondary | ICD-10-CM | POA: Diagnosis not present

## 2020-12-23 DIAGNOSIS — N2581 Secondary hyperparathyroidism of renal origin: Secondary | ICD-10-CM | POA: Diagnosis not present

## 2020-12-24 DIAGNOSIS — R799 Abnormal finding of blood chemistry, unspecified: Secondary | ICD-10-CM | POA: Diagnosis not present

## 2020-12-24 DIAGNOSIS — R198 Other specified symptoms and signs involving the digestive system and abdomen: Secondary | ICD-10-CM | POA: Diagnosis not present

## 2020-12-24 DIAGNOSIS — K7460 Unspecified cirrhosis of liver: Secondary | ICD-10-CM | POA: Diagnosis not present

## 2020-12-25 DIAGNOSIS — N2581 Secondary hyperparathyroidism of renal origin: Secondary | ICD-10-CM | POA: Diagnosis not present

## 2020-12-25 DIAGNOSIS — N186 End stage renal disease: Secondary | ICD-10-CM | POA: Diagnosis not present

## 2020-12-25 DIAGNOSIS — Z992 Dependence on renal dialysis: Secondary | ICD-10-CM | POA: Diagnosis not present

## 2020-12-25 DIAGNOSIS — D509 Iron deficiency anemia, unspecified: Secondary | ICD-10-CM | POA: Diagnosis not present

## 2020-12-25 DIAGNOSIS — D631 Anemia in chronic kidney disease: Secondary | ICD-10-CM | POA: Diagnosis not present

## 2020-12-26 ENCOUNTER — Encounter: Payer: Medicare Other | Admitting: Rehabilitative and Restorative Service Providers"

## 2020-12-27 DIAGNOSIS — D509 Iron deficiency anemia, unspecified: Secondary | ICD-10-CM | POA: Diagnosis not present

## 2020-12-27 DIAGNOSIS — N2581 Secondary hyperparathyroidism of renal origin: Secondary | ICD-10-CM | POA: Diagnosis not present

## 2020-12-27 DIAGNOSIS — D631 Anemia in chronic kidney disease: Secondary | ICD-10-CM | POA: Diagnosis not present

## 2020-12-27 DIAGNOSIS — N186 End stage renal disease: Secondary | ICD-10-CM | POA: Diagnosis not present

## 2020-12-27 DIAGNOSIS — Z992 Dependence on renal dialysis: Secondary | ICD-10-CM | POA: Diagnosis not present

## 2020-12-30 DIAGNOSIS — N2581 Secondary hyperparathyroidism of renal origin: Secondary | ICD-10-CM | POA: Diagnosis not present

## 2020-12-30 DIAGNOSIS — N186 End stage renal disease: Secondary | ICD-10-CM | POA: Diagnosis not present

## 2020-12-30 DIAGNOSIS — D509 Iron deficiency anemia, unspecified: Secondary | ICD-10-CM | POA: Diagnosis not present

## 2020-12-30 DIAGNOSIS — Z992 Dependence on renal dialysis: Secondary | ICD-10-CM | POA: Diagnosis not present

## 2020-12-30 DIAGNOSIS — D631 Anemia in chronic kidney disease: Secondary | ICD-10-CM | POA: Diagnosis not present

## 2020-12-31 ENCOUNTER — Encounter: Payer: Medicare Other | Admitting: Rehabilitative and Restorative Service Providers"

## 2021-01-01 DIAGNOSIS — D631 Anemia in chronic kidney disease: Secondary | ICD-10-CM | POA: Diagnosis not present

## 2021-01-01 DIAGNOSIS — N186 End stage renal disease: Secondary | ICD-10-CM | POA: Diagnosis not present

## 2021-01-01 DIAGNOSIS — Z992 Dependence on renal dialysis: Secondary | ICD-10-CM | POA: Diagnosis not present

## 2021-01-01 DIAGNOSIS — D509 Iron deficiency anemia, unspecified: Secondary | ICD-10-CM | POA: Diagnosis not present

## 2021-01-01 DIAGNOSIS — N2581 Secondary hyperparathyroidism of renal origin: Secondary | ICD-10-CM | POA: Diagnosis not present

## 2021-01-03 DIAGNOSIS — N186 End stage renal disease: Secondary | ICD-10-CM | POA: Diagnosis not present

## 2021-01-03 DIAGNOSIS — D509 Iron deficiency anemia, unspecified: Secondary | ICD-10-CM | POA: Diagnosis not present

## 2021-01-03 DIAGNOSIS — D631 Anemia in chronic kidney disease: Secondary | ICD-10-CM | POA: Diagnosis not present

## 2021-01-03 DIAGNOSIS — Z992 Dependence on renal dialysis: Secondary | ICD-10-CM | POA: Diagnosis not present

## 2021-01-03 DIAGNOSIS — N2581 Secondary hyperparathyroidism of renal origin: Secondary | ICD-10-CM | POA: Diagnosis not present

## 2021-01-06 DIAGNOSIS — D631 Anemia in chronic kidney disease: Secondary | ICD-10-CM | POA: Diagnosis not present

## 2021-01-06 DIAGNOSIS — Z992 Dependence on renal dialysis: Secondary | ICD-10-CM | POA: Diagnosis not present

## 2021-01-06 DIAGNOSIS — D509 Iron deficiency anemia, unspecified: Secondary | ICD-10-CM | POA: Diagnosis not present

## 2021-01-06 DIAGNOSIS — N186 End stage renal disease: Secondary | ICD-10-CM | POA: Diagnosis not present

## 2021-01-06 DIAGNOSIS — N2581 Secondary hyperparathyroidism of renal origin: Secondary | ICD-10-CM | POA: Diagnosis not present

## 2021-01-08 DIAGNOSIS — D509 Iron deficiency anemia, unspecified: Secondary | ICD-10-CM | POA: Diagnosis not present

## 2021-01-08 DIAGNOSIS — N186 End stage renal disease: Secondary | ICD-10-CM | POA: Diagnosis not present

## 2021-01-08 DIAGNOSIS — Z992 Dependence on renal dialysis: Secondary | ICD-10-CM | POA: Diagnosis not present

## 2021-01-08 DIAGNOSIS — N2581 Secondary hyperparathyroidism of renal origin: Secondary | ICD-10-CM | POA: Diagnosis not present

## 2021-01-08 DIAGNOSIS — D631 Anemia in chronic kidney disease: Secondary | ICD-10-CM | POA: Diagnosis not present

## 2021-01-10 DIAGNOSIS — D509 Iron deficiency anemia, unspecified: Secondary | ICD-10-CM | POA: Diagnosis not present

## 2021-01-10 DIAGNOSIS — N2581 Secondary hyperparathyroidism of renal origin: Secondary | ICD-10-CM | POA: Diagnosis not present

## 2021-01-10 DIAGNOSIS — Z992 Dependence on renal dialysis: Secondary | ICD-10-CM | POA: Diagnosis not present

## 2021-01-10 DIAGNOSIS — D631 Anemia in chronic kidney disease: Secondary | ICD-10-CM | POA: Diagnosis not present

## 2021-01-10 DIAGNOSIS — N269 Renal sclerosis, unspecified: Secondary | ICD-10-CM | POA: Diagnosis not present

## 2021-01-10 DIAGNOSIS — N186 End stage renal disease: Secondary | ICD-10-CM | POA: Diagnosis not present

## 2021-01-13 DIAGNOSIS — D631 Anemia in chronic kidney disease: Secondary | ICD-10-CM | POA: Diagnosis not present

## 2021-01-13 DIAGNOSIS — N2581 Secondary hyperparathyroidism of renal origin: Secondary | ICD-10-CM | POA: Diagnosis not present

## 2021-01-13 DIAGNOSIS — Z992 Dependence on renal dialysis: Secondary | ICD-10-CM | POA: Diagnosis not present

## 2021-01-13 DIAGNOSIS — D509 Iron deficiency anemia, unspecified: Secondary | ICD-10-CM | POA: Diagnosis not present

## 2021-01-13 DIAGNOSIS — N186 End stage renal disease: Secondary | ICD-10-CM | POA: Diagnosis not present

## 2021-01-15 DIAGNOSIS — D631 Anemia in chronic kidney disease: Secondary | ICD-10-CM | POA: Diagnosis not present

## 2021-01-15 DIAGNOSIS — N186 End stage renal disease: Secondary | ICD-10-CM | POA: Diagnosis not present

## 2021-01-15 DIAGNOSIS — N2581 Secondary hyperparathyroidism of renal origin: Secondary | ICD-10-CM | POA: Diagnosis not present

## 2021-01-15 DIAGNOSIS — D509 Iron deficiency anemia, unspecified: Secondary | ICD-10-CM | POA: Diagnosis not present

## 2021-01-15 DIAGNOSIS — Z992 Dependence on renal dialysis: Secondary | ICD-10-CM | POA: Diagnosis not present

## 2021-01-17 DIAGNOSIS — N186 End stage renal disease: Secondary | ICD-10-CM | POA: Diagnosis not present

## 2021-01-17 DIAGNOSIS — Z992 Dependence on renal dialysis: Secondary | ICD-10-CM | POA: Diagnosis not present

## 2021-01-17 DIAGNOSIS — N2581 Secondary hyperparathyroidism of renal origin: Secondary | ICD-10-CM | POA: Diagnosis not present

## 2021-01-17 DIAGNOSIS — D631 Anemia in chronic kidney disease: Secondary | ICD-10-CM | POA: Diagnosis not present

## 2021-01-17 DIAGNOSIS — D509 Iron deficiency anemia, unspecified: Secondary | ICD-10-CM | POA: Diagnosis not present

## 2021-01-19 DIAGNOSIS — N186 End stage renal disease: Secondary | ICD-10-CM | POA: Diagnosis not present

## 2021-01-19 DIAGNOSIS — I270 Primary pulmonary hypertension: Secondary | ICD-10-CM | POA: Diagnosis not present

## 2021-01-19 DIAGNOSIS — R0609 Other forms of dyspnea: Secondary | ICD-10-CM | POA: Diagnosis not present

## 2021-01-20 DIAGNOSIS — D631 Anemia in chronic kidney disease: Secondary | ICD-10-CM | POA: Diagnosis not present

## 2021-01-20 DIAGNOSIS — Z992 Dependence on renal dialysis: Secondary | ICD-10-CM | POA: Diagnosis not present

## 2021-01-20 DIAGNOSIS — N2581 Secondary hyperparathyroidism of renal origin: Secondary | ICD-10-CM | POA: Diagnosis not present

## 2021-01-20 DIAGNOSIS — D509 Iron deficiency anemia, unspecified: Secondary | ICD-10-CM | POA: Diagnosis not present

## 2021-01-20 DIAGNOSIS — N186 End stage renal disease: Secondary | ICD-10-CM | POA: Diagnosis not present

## 2021-01-21 ENCOUNTER — Telehealth: Payer: Self-pay | Admitting: Adult Health

## 2021-01-21 DIAGNOSIS — R2689 Other abnormalities of gait and mobility: Secondary | ICD-10-CM

## 2021-01-21 DIAGNOSIS — G8110 Spastic hemiplegia affecting unspecified side: Secondary | ICD-10-CM

## 2021-01-21 NOTE — Telephone Encounter (Signed)
Pt called wanting to proceed with neuro rehab says the pain in legs and the tingling in toes have become worse. Pt requesting a call back.

## 2021-01-21 NOTE — Telephone Encounter (Signed)
Pt would like to do neuro rehab, please advise

## 2021-01-22 DIAGNOSIS — N186 End stage renal disease: Secondary | ICD-10-CM | POA: Diagnosis not present

## 2021-01-22 DIAGNOSIS — Z992 Dependence on renal dialysis: Secondary | ICD-10-CM | POA: Diagnosis not present

## 2021-01-22 DIAGNOSIS — D509 Iron deficiency anemia, unspecified: Secondary | ICD-10-CM | POA: Diagnosis not present

## 2021-01-22 DIAGNOSIS — N2581 Secondary hyperparathyroidism of renal origin: Secondary | ICD-10-CM | POA: Diagnosis not present

## 2021-01-22 DIAGNOSIS — D631 Anemia in chronic kidney disease: Secondary | ICD-10-CM | POA: Diagnosis not present

## 2021-01-22 NOTE — Telephone Encounter (Signed)
Placed referral to neuro rehab for post stroke gait impairment and right sided weakness. Previously working with ortho PT for cervicalgia but this has since been completed.

## 2021-01-24 DIAGNOSIS — N186 End stage renal disease: Secondary | ICD-10-CM | POA: Diagnosis not present

## 2021-01-24 DIAGNOSIS — Z992 Dependence on renal dialysis: Secondary | ICD-10-CM | POA: Diagnosis not present

## 2021-01-24 DIAGNOSIS — D509 Iron deficiency anemia, unspecified: Secondary | ICD-10-CM | POA: Diagnosis not present

## 2021-01-24 DIAGNOSIS — D631 Anemia in chronic kidney disease: Secondary | ICD-10-CM | POA: Diagnosis not present

## 2021-01-24 DIAGNOSIS — N2581 Secondary hyperparathyroidism of renal origin: Secondary | ICD-10-CM | POA: Diagnosis not present

## 2021-01-26 ENCOUNTER — Ambulatory Visit: Payer: Medicare Other

## 2021-01-27 DIAGNOSIS — N2581 Secondary hyperparathyroidism of renal origin: Secondary | ICD-10-CM | POA: Diagnosis not present

## 2021-01-27 DIAGNOSIS — D631 Anemia in chronic kidney disease: Secondary | ICD-10-CM | POA: Diagnosis not present

## 2021-01-27 DIAGNOSIS — Z992 Dependence on renal dialysis: Secondary | ICD-10-CM | POA: Diagnosis not present

## 2021-01-27 DIAGNOSIS — N186 End stage renal disease: Secondary | ICD-10-CM | POA: Diagnosis not present

## 2021-01-27 DIAGNOSIS — D509 Iron deficiency anemia, unspecified: Secondary | ICD-10-CM | POA: Diagnosis not present

## 2021-01-29 DIAGNOSIS — N2581 Secondary hyperparathyroidism of renal origin: Secondary | ICD-10-CM | POA: Diagnosis not present

## 2021-01-29 DIAGNOSIS — Z992 Dependence on renal dialysis: Secondary | ICD-10-CM | POA: Diagnosis not present

## 2021-01-29 DIAGNOSIS — N186 End stage renal disease: Secondary | ICD-10-CM | POA: Diagnosis not present

## 2021-01-29 DIAGNOSIS — D509 Iron deficiency anemia, unspecified: Secondary | ICD-10-CM | POA: Diagnosis not present

## 2021-01-29 DIAGNOSIS — D631 Anemia in chronic kidney disease: Secondary | ICD-10-CM | POA: Diagnosis not present

## 2021-01-30 DIAGNOSIS — Z1289 Encounter for screening for malignant neoplasm of other sites: Secondary | ICD-10-CM | POA: Diagnosis not present

## 2021-01-30 DIAGNOSIS — K769 Liver disease, unspecified: Secondary | ICD-10-CM | POA: Diagnosis not present

## 2021-01-31 DIAGNOSIS — D631 Anemia in chronic kidney disease: Secondary | ICD-10-CM | POA: Diagnosis not present

## 2021-01-31 DIAGNOSIS — N186 End stage renal disease: Secondary | ICD-10-CM | POA: Diagnosis not present

## 2021-01-31 DIAGNOSIS — Z992 Dependence on renal dialysis: Secondary | ICD-10-CM | POA: Diagnosis not present

## 2021-01-31 DIAGNOSIS — N2581 Secondary hyperparathyroidism of renal origin: Secondary | ICD-10-CM | POA: Diagnosis not present

## 2021-01-31 DIAGNOSIS — D509 Iron deficiency anemia, unspecified: Secondary | ICD-10-CM | POA: Diagnosis not present

## 2021-02-01 DIAGNOSIS — I059 Rheumatic mitral valve disease, unspecified: Secondary | ICD-10-CM | POA: Insufficient documentation

## 2021-02-01 DIAGNOSIS — G4700 Insomnia, unspecified: Secondary | ICD-10-CM | POA: Insufficient documentation

## 2021-02-01 DIAGNOSIS — R188 Other ascites: Secondary | ICD-10-CM | POA: Insufficient documentation

## 2021-02-01 DIAGNOSIS — I119 Hypertensive heart disease without heart failure: Secondary | ICD-10-CM | POA: Insufficient documentation

## 2021-02-01 DIAGNOSIS — I7090 Unspecified atherosclerosis: Secondary | ICD-10-CM | POA: Insufficient documentation

## 2021-02-01 DIAGNOSIS — K626 Ulcer of anus and rectum: Secondary | ICD-10-CM | POA: Insufficient documentation

## 2021-02-03 DIAGNOSIS — N2581 Secondary hyperparathyroidism of renal origin: Secondary | ICD-10-CM | POA: Diagnosis not present

## 2021-02-03 DIAGNOSIS — N186 End stage renal disease: Secondary | ICD-10-CM | POA: Diagnosis not present

## 2021-02-03 DIAGNOSIS — D631 Anemia in chronic kidney disease: Secondary | ICD-10-CM | POA: Diagnosis not present

## 2021-02-03 DIAGNOSIS — Z992 Dependence on renal dialysis: Secondary | ICD-10-CM | POA: Diagnosis not present

## 2021-02-03 DIAGNOSIS — D509 Iron deficiency anemia, unspecified: Secondary | ICD-10-CM | POA: Diagnosis not present

## 2021-02-05 DIAGNOSIS — D631 Anemia in chronic kidney disease: Secondary | ICD-10-CM | POA: Diagnosis not present

## 2021-02-05 DIAGNOSIS — N2581 Secondary hyperparathyroidism of renal origin: Secondary | ICD-10-CM | POA: Diagnosis not present

## 2021-02-05 DIAGNOSIS — Z992 Dependence on renal dialysis: Secondary | ICD-10-CM | POA: Diagnosis not present

## 2021-02-05 DIAGNOSIS — N186 End stage renal disease: Secondary | ICD-10-CM | POA: Diagnosis not present

## 2021-02-05 DIAGNOSIS — D509 Iron deficiency anemia, unspecified: Secondary | ICD-10-CM | POA: Diagnosis not present

## 2021-02-07 DIAGNOSIS — D631 Anemia in chronic kidney disease: Secondary | ICD-10-CM | POA: Diagnosis not present

## 2021-02-07 DIAGNOSIS — D509 Iron deficiency anemia, unspecified: Secondary | ICD-10-CM | POA: Diagnosis not present

## 2021-02-07 DIAGNOSIS — N186 End stage renal disease: Secondary | ICD-10-CM | POA: Diagnosis not present

## 2021-02-07 DIAGNOSIS — Z992 Dependence on renal dialysis: Secondary | ICD-10-CM | POA: Diagnosis not present

## 2021-02-07 DIAGNOSIS — N2581 Secondary hyperparathyroidism of renal origin: Secondary | ICD-10-CM | POA: Diagnosis not present

## 2021-02-10 DIAGNOSIS — N269 Renal sclerosis, unspecified: Secondary | ICD-10-CM | POA: Diagnosis not present

## 2021-02-10 DIAGNOSIS — N2581 Secondary hyperparathyroidism of renal origin: Secondary | ICD-10-CM | POA: Diagnosis not present

## 2021-02-10 DIAGNOSIS — D509 Iron deficiency anemia, unspecified: Secondary | ICD-10-CM | POA: Diagnosis not present

## 2021-02-10 DIAGNOSIS — Z992 Dependence on renal dialysis: Secondary | ICD-10-CM | POA: Diagnosis not present

## 2021-02-10 DIAGNOSIS — N186 End stage renal disease: Secondary | ICD-10-CM | POA: Diagnosis not present

## 2021-02-10 DIAGNOSIS — D631 Anemia in chronic kidney disease: Secondary | ICD-10-CM | POA: Diagnosis not present

## 2021-02-12 DIAGNOSIS — N2581 Secondary hyperparathyroidism of renal origin: Secondary | ICD-10-CM | POA: Diagnosis not present

## 2021-02-12 DIAGNOSIS — Z992 Dependence on renal dialysis: Secondary | ICD-10-CM | POA: Diagnosis not present

## 2021-02-12 DIAGNOSIS — D631 Anemia in chronic kidney disease: Secondary | ICD-10-CM | POA: Diagnosis not present

## 2021-02-12 DIAGNOSIS — D509 Iron deficiency anemia, unspecified: Secondary | ICD-10-CM | POA: Diagnosis not present

## 2021-02-12 DIAGNOSIS — N186 End stage renal disease: Secondary | ICD-10-CM | POA: Diagnosis not present

## 2021-02-14 DIAGNOSIS — D631 Anemia in chronic kidney disease: Secondary | ICD-10-CM | POA: Diagnosis not present

## 2021-02-14 DIAGNOSIS — N2581 Secondary hyperparathyroidism of renal origin: Secondary | ICD-10-CM | POA: Diagnosis not present

## 2021-02-14 DIAGNOSIS — D509 Iron deficiency anemia, unspecified: Secondary | ICD-10-CM | POA: Diagnosis not present

## 2021-02-14 DIAGNOSIS — Z992 Dependence on renal dialysis: Secondary | ICD-10-CM | POA: Diagnosis not present

## 2021-02-14 DIAGNOSIS — N186 End stage renal disease: Secondary | ICD-10-CM | POA: Diagnosis not present

## 2021-02-16 ENCOUNTER — Other Ambulatory Visit: Payer: Self-pay

## 2021-02-16 ENCOUNTER — Encounter: Payer: Self-pay | Admitting: Physical Therapy

## 2021-02-16 ENCOUNTER — Ambulatory Visit: Payer: Medicare Other | Attending: Family Medicine | Admitting: Physical Therapy

## 2021-02-16 DIAGNOSIS — M6281 Muscle weakness (generalized): Secondary | ICD-10-CM | POA: Diagnosis not present

## 2021-02-16 DIAGNOSIS — N8189 Other female genital prolapse: Secondary | ICD-10-CM | POA: Insufficient documentation

## 2021-02-16 DIAGNOSIS — R278 Other lack of coordination: Secondary | ICD-10-CM | POA: Insufficient documentation

## 2021-02-16 NOTE — Therapy (Signed)
[facility] @ [place], [place], [address] Phone: [phone]   Fax:  [phone]  Physical Therapy Treatment  Patient Details  Name: Katie Nunez MRN: [id number] Date of Birth: 1956-02-10 Referring Provider (PT): Dr. [name]   Encounter Date: 02/16/2021   PT End of Session - 02/16/21 1448     Visit Number 1    Date for PT Re-Evaluation 04/13/21    Authorization Type UHC medicare    PT Start Time 1400    PT Stop Time 1638    PT Time Calculation (min) 45 min    Activity Tolerance Patient tolerated treatment well    Behavior During Therapy [facility] for tasks assessed/performed             Past Medical History:  Diagnosis Date   Achalasia    Anxiety    Dysplasia of cervix, low grade (CIN 1)    Environmental allergies    "all year long" (12/27/2016)   ESRD (end stage renal disease) on dialysis ([place])    "TTS; [place]" (12/27/2016)   Fibromyalgia    GERD (gastroesophageal reflux disease)    Gout    Hypertension    IBS (irritable bowel syndrome)    MVP (mitral valve prolapse)    RA (rheumatoid arthritis) ([place])    FOLLOWED BY DR. SHANAHAN   Raynaud's disease    Scleroderma ([place])    Seasonal allergies    Thrombocytopenia ([place]) 07/01/2016   Acute fall to 13,000 07/01/16   Tubular adenoma 01/08/2008   CECUM   Vitamin D deficiency     Past Surgical History:  Procedure Laterality Date   ANKLE FRACTURE SURGERY Right    AV FISTULA PLACEMENT Left 06/28/2016   Procedure: left arm ARTERIOVENOUS (AV) FISTULA CREATION;  Surgeon: Rosetta Posner, MD;  [facility];  Service: Vascular;  Laterality: Left;   [place] Left 09/27/2016   Procedure: LEFT UPPER ARM CEPHALIC VEIN TRANSPOSITION;  Surgeon: Rosetta Posner, MD;  [facility];  Service: Vascular;  Laterality: Left;   BREAST BIOPSY     "? side"   [place]     COLONOSCOPY W/ BIOPSIES   01/08/2008   INSERTION OF DIALYSIS CATHETER Right 06/28/2016   Procedure: INSERTION OF DIALYSIS CATHETER, right internal jugular;  Surgeon: Rosetta Posner, MD;  Location: [place];  Service: Vascular;  Laterality: Right;   MYOMECTOMY     NASAL ENDOSCOPY WITH EPISTAXIS CONTROL N/A 12/29/2019   Procedure: NASAL ENDOSCOPY WITH EPISTAXIS CONTROL;  Surgeon: Leta Baptist, MD;  Location: [facility];  Service: ENT;  Laterality: N/A;   PELVIC LAPAROSCOPY  2011   superficial thrombophlebitis Left 07-2014    There were no vitals filed for this visit.   Subjective Assessment - 02/16/21 1418     Subjective Patient reports 01/2020 after she had a stroke she noticed she had trouble with her bowels, trouble empting her stool. Patient sits on toilet and continues to push. She has trouble getting the bowel out. Sometimes she feels like she is sitting on the bowels. She does not leak stool. She uses petroleum jelly on the wipe to get the bowel out or push at the tailbone.    Patient Stated Goals learn how to expel the stool without putting had no tailbone to push the stool out    Currently in Pain? No/denies    Multiple Pain Sites No  [facility] PT Assessment - 02/16/21 0001       Assessment   Medical Diagnosis N81.89 Pelvic floor weakness    Referring Provider (PT) Dr. [name]    Onset Date/Surgical Date --   01/12/2020   Prior Therapy not for pelvic floor      Precautions   Precautions None      Restrictions   Weight Bearing Restrictions No      Balance Screen   Has the patient fallen in the past 6 months No    Has the patient had a decrease in activity level because of a fear of falling?  No    Is the patient reluctant to leave their home because of a fear of falling?  No      Home Environment   Living Environment Private residence    Additional Comments Stairs to bedroom/bathroom      Prior Function   Level of Independence Independent      Cognition   Overall Cognitive Status  Within Functional Limits for tasks assessed      Posture/Postural Control   Posture/Postural Control Postural limitations    Postural Limitations Rounded Shoulders;Forward head      ROM / Strength   AROM / PROM / Strength AROM;PROM;Strength      Strength   Right Hip Flexion 4-/5    Right Hip Extension 3/5    Right Hip ABduction 3/5    Right Hip ADduction 3/5      Palpation   Palpation comment tightness in the lower abdominal      Ambulation/Gait   Assistive device Straight cane                        Pelvic Floor Special Questions - 02/16/21 0001     Prior Pregnancies Yes    Number of C-Sections 1    Urinary Leakage No    Fecal incontinence Yes   stool type- type 2, 3, 4   Falling out feeling (prolapse) Yes    Activities that cause feeling of prolapse when going to the bathroom    Pelvic Floor Internal Exam Patient confirms identification and approves PT to assess pelvic floor and treatment    Exam Type Rectal    Palpation coccyx bent to the right, tightness of the anococcygeal ligament; patient was able to push the therapist finger out; tightness at the perineal body    Strength weak squeeze, no lift   after manual work to the tailbone and anococcygeal ligament improved to 3/5                         PT Short Term Goals - 02/16/21 1621       PT SHORT TERM GOAL #1   Title Patient will demonstrate independent use of home exercise program including pelvic floor exercises    Baseline ---    Time 4    Period Weeks    Status New    Target Date 03/16/21      PT SHORT TERM GOAL #2   Title ---    Baseline ---               PT Long Term Goals - 02/16/21 1621       PT LONG TERM GOAL #1   Title Patient will demonstrate advanced HEP for pelvic floor coordination and strength    Baseline --    Time 8    Period  Weeks    Status New    Target Date 04/13/21      PT LONG TERM GOAL #2   Title Patient will be able to have a bowel  movement without straining or pushing by the tailbone to fully empty her stool    Baseline ----    Time 8    Period Weeks    Status New    Target Date 04/13/21      PT LONG TERM GOAL #3   Title Patient will report her stool leakage has improved >/= 75% due to improved pelvic floor coordination    Baseline ---    Time 8    Period Weeks    Status New    Target Date 04/13/21      PT LONG TERM GOAL #4   Title Patient will be able to demonstrate correct  toileting using the squatty potty to push stool out and not having to assist    Baseline ----    Time 8    Period Weeks    Status New    Target Date 04/13/21      PT LONG TERM GOAL #5   Title ----    Baseline ----                   Plan - 02/16/21 1611     Clinical Impression Statement Patient is a 66 year old female with female genital prolapse and difficulty with bowel movements since she had her stroke on 12/2019. Patient walks with a single point cane since her stroke. Right hip strength is 3/5. She will strain to have a bowel movement, use a tissue with petroleum jelly on it to wipe the anus or has to push by the tailbone.She reports she feels like she is sitting on something. She will wear disposable underwear for just in case fecal leakage. Rectal strength is 2/5 but after therapist mobilized the anococcygeal ligament and distraction of the coccyx the strength went to 3/5 and she was able to move the puborectalis. The coccyx bone is sidebent to the right. She has Type 2, 3, 4 stool. She ahs a falling out feeling when she is using the commode. Patient will benefit from skilled therapy to work on pelvic floor coordinaiton to improve stool emptying and and reduce leakage.    Personal Factors and Comorbidities Comorbidity 3+;Time since onset of injury/illness/exacerbation;Past/Current Experience;Fitness    Comorbidities Stroke 12/2019; End stage renal disease; Raynauds disease; Sclerodema; RA; Pelvic laproscopy for myomectomy  2011; Dialyssis; C-section    Examination-Activity Limitations Continence;Toileting    Stability/Clinical Decision Making Stable/Uncomplicated    Clinical Decision Making Low    Rehab Potential Good    PT Frequency 1x / week    PT Duration 8 weeks    PT Treatment/Interventions ADLs/Self Care Home Management;Biofeedback;Therapeutic activities;Therapeutic exercise;Neuromuscular re-education;Patient/family education;Manual techniques;Spinal Manipulations    PT Next Visit Plan work on coccyx, pushing finger out of anus; toileting with squatty potty, pelvic floor strength and core strength    Consulted and Agree with Plan of Care Patient             Patient will benefit from skilled therapeutic intervention in order to improve the following deficits and impairments:  Decreased coordination, Increased fascial restricitons, Decreased activity tolerance, Decreased strength  Visit Diagnosis: Muscle weakness (generalized)  Other lack of coordination     Problem List Patient Active Problem List   Diagnosis Date Noted   Neck pain 10/30/2020   Knee locking,  left 10/05/2020   Abnormality of gait 02/25/2020   Protein-calorie malnutrition, severe 01/19/2020   Epistaxis    Sleep disturbance    Slow transit constipation    Hemorrhoids    Chronic systolic congestive heart failure (HCC)    ESRD on dialysis ([place])    Right hemiparesis (HCC)    Pressure injury of skin 12/20/2019   Intraparenchymal hemorrhage of brain ([place]) 12/18/2019   ICH (intracerebral hemorrhage) ([place]) 12/12/2019   Viral disease 08/22/2019   Chronic right-sided heart failure ([place]) 07/09/2019   Supraventricular tachycardia ([place]) 07/09/2019   Other cirrhosis of liver ([place]) 06/27/2019   Heart failure ([place]) 02/21/2019   Heart palpitations 08/14/2018   Other fluid overload 07/27/2018   Encounter for long-term (current) use of other medications 01/18/2018   Hypothyroidism 01/18/2018   Myalgia and myositis 01/18/2018    Chronic nephritis 01/18/2018   Other long term (current) drug therapy 01/18/2018   Sleep apnea 01/18/2018   Unspecified persistent mental disorders due to conditions classified elsewhere 01/18/2018   Venous reflux 01/18/2018   Vitamin B12 deficiency 01/18/2018   Vitamin D deficiency 01/18/2018   Obstructive lung disease ([place]) 12/30/2017   Other pruritus 12/27/2017   Eruption cyst 12/19/2017   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 ([place]) 11/28/2017   Chronic cough 11/11/2017   Pulmonary hypertension ([place]) 11/11/2017   Pulmonary arterial hypertension ([place]) 10/07/2017   Acute ITP ([place]) 01/05/2017   ESRD (end stage renal disease) ([place]) 12/28/2016   Encounter for removal of sutures 09/09/2016   Coagulation defect, unspecified ([place]) 09/01/2016   Underimmunization status 08/05/2016   Hemolytic anemia ([place]) 07/26/2016   Epistaxis, recurrent 07/26/2016   Unspecified protein-calorie malnutrition ([place]) 07/13/2016   Aftercare including intermittent dialysis ([place]) 07/07/2016   Anemia in chronic kidney disease 07/07/2016   Hypokalemia 07/07/2016   Iron deficiency anemia, unspecified 07/07/2016   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   Nonrheumatic mitral (valve) prolapse 07/07/2016   Other irritable bowel syndrome 07/07/2016   Other secondary thrombocytopenia 07/07/2016   Secondary hyperparathyroidism of renal origin ([place]) 07/07/2016   Thrombocytopenia ([place]) 07/01/2016   ARF (acute renal failure) ([place]) 06/25/2016   Anxiety 06/25/2016   Bilateral carpal tunnel syndrome 10/22/2015   Chronic gout without tophus 10/22/2015   Chronic nonintractable headache 10/08/2015   Fibroid uterus 01/03/2012   H/O vitamin D deficiency 01/03/2012   Post-menopausal 01/03/2012   Hereditary and idiopathic peripheral neuropathy 11/19/2011   Intestinal malabsorption 86/76/7209   Lichen planus 47/09/6281   Low back pain 11/19/2011   Diffuse spasm of esophagus 11/11/2011   ESR raised  11/11/2011   Postinflammatory pulmonary fibrosis ([place]) 11/11/2011   Scleroderma ([place]) 11/16/2010   Rheumatoid arthritis ([place]) 11/16/2010   Raynaud's disease 11/16/2010   Symptomatic menopausal or female climacteric states 11/16/2010    Earlie Counts, PT 02/16/21 4:26 PM  [facility] @ [place], [place], [address] Phone: [phone]   Fax:  [phone]  Name: KIYONNA TORTORELLI MRN: [id number] Date of Birth: 08/30/1955

## 2021-02-17 DIAGNOSIS — D509 Iron deficiency anemia, unspecified: Secondary | ICD-10-CM | POA: Diagnosis not present

## 2021-02-17 DIAGNOSIS — D631 Anemia in chronic kidney disease: Secondary | ICD-10-CM | POA: Diagnosis not present

## 2021-02-17 DIAGNOSIS — N2581 Secondary hyperparathyroidism of renal origin: Secondary | ICD-10-CM | POA: Diagnosis not present

## 2021-02-17 DIAGNOSIS — Z992 Dependence on renal dialysis: Secondary | ICD-10-CM | POA: Diagnosis not present

## 2021-02-17 DIAGNOSIS — N186 End stage renal disease: Secondary | ICD-10-CM | POA: Diagnosis not present

## 2021-02-19 DIAGNOSIS — D631 Anemia in chronic kidney disease: Secondary | ICD-10-CM | POA: Diagnosis not present

## 2021-02-19 DIAGNOSIS — R0609 Other forms of dyspnea: Secondary | ICD-10-CM | POA: Diagnosis not present

## 2021-02-19 DIAGNOSIS — N2581 Secondary hyperparathyroidism of renal origin: Secondary | ICD-10-CM | POA: Diagnosis not present

## 2021-02-19 DIAGNOSIS — D509 Iron deficiency anemia, unspecified: Secondary | ICD-10-CM | POA: Diagnosis not present

## 2021-02-19 DIAGNOSIS — Z992 Dependence on renal dialysis: Secondary | ICD-10-CM | POA: Diagnosis not present

## 2021-02-19 DIAGNOSIS — I270 Primary pulmonary hypertension: Secondary | ICD-10-CM | POA: Diagnosis not present

## 2021-02-19 DIAGNOSIS — N186 End stage renal disease: Secondary | ICD-10-CM | POA: Diagnosis not present

## 2021-02-21 DIAGNOSIS — N186 End stage renal disease: Secondary | ICD-10-CM | POA: Diagnosis not present

## 2021-02-21 DIAGNOSIS — D631 Anemia in chronic kidney disease: Secondary | ICD-10-CM | POA: Diagnosis not present

## 2021-02-21 DIAGNOSIS — Z992 Dependence on renal dialysis: Secondary | ICD-10-CM | POA: Diagnosis not present

## 2021-02-21 DIAGNOSIS — N2581 Secondary hyperparathyroidism of renal origin: Secondary | ICD-10-CM | POA: Diagnosis not present

## 2021-02-21 DIAGNOSIS — D509 Iron deficiency anemia, unspecified: Secondary | ICD-10-CM | POA: Diagnosis not present

## 2021-02-24 DIAGNOSIS — I81 Portal vein thrombosis: Secondary | ICD-10-CM | POA: Diagnosis not present

## 2021-02-24 DIAGNOSIS — I519 Heart disease, unspecified: Secondary | ICD-10-CM | POA: Diagnosis not present

## 2021-02-24 DIAGNOSIS — N186 End stage renal disease: Secondary | ICD-10-CM | POA: Diagnosis not present

## 2021-02-24 DIAGNOSIS — N2581 Secondary hyperparathyroidism of renal origin: Secondary | ICD-10-CM | POA: Diagnosis not present

## 2021-02-24 DIAGNOSIS — J342 Deviated nasal septum: Secondary | ICD-10-CM | POA: Diagnosis not present

## 2021-02-24 DIAGNOSIS — D649 Anemia, unspecified: Secondary | ICD-10-CM | POA: Diagnosis not present

## 2021-02-24 DIAGNOSIS — M349 Systemic sclerosis, unspecified: Secondary | ICD-10-CM | POA: Diagnosis not present

## 2021-02-24 DIAGNOSIS — I890 Lymphedema, not elsewhere classified: Secondary | ICD-10-CM | POA: Diagnosis not present

## 2021-02-24 DIAGNOSIS — I7300 Raynaud's syndrome without gangrene: Secondary | ICD-10-CM | POA: Diagnosis not present

## 2021-02-24 DIAGNOSIS — D631 Anemia in chronic kidney disease: Secondary | ICD-10-CM | POA: Diagnosis not present

## 2021-02-24 DIAGNOSIS — Z7982 Long term (current) use of aspirin: Secondary | ICD-10-CM | POA: Diagnosis not present

## 2021-02-24 DIAGNOSIS — D693 Immune thrombocytopenic purpura: Secondary | ICD-10-CM | POA: Diagnosis not present

## 2021-02-24 DIAGNOSIS — D509 Iron deficiency anemia, unspecified: Secondary | ICD-10-CM | POA: Diagnosis not present

## 2021-02-24 DIAGNOSIS — Z992 Dependence on renal dialysis: Secondary | ICD-10-CM | POA: Diagnosis not present

## 2021-02-24 DIAGNOSIS — K219 Gastro-esophageal reflux disease without esophagitis: Secondary | ICD-10-CM | POA: Diagnosis not present

## 2021-02-24 DIAGNOSIS — I2721 Secondary pulmonary arterial hypertension: Secondary | ICD-10-CM | POA: Diagnosis not present

## 2021-02-24 DIAGNOSIS — Z79620 Long term (current) use of immunosuppressive biologic: Secondary | ICD-10-CM | POA: Diagnosis not present

## 2021-02-24 DIAGNOSIS — Z79899 Other long term (current) drug therapy: Secondary | ICD-10-CM | POA: Diagnosis not present

## 2021-02-24 DIAGNOSIS — M359 Systemic involvement of connective tissue, unspecified: Secondary | ICD-10-CM | POA: Diagnosis not present

## 2021-02-26 DIAGNOSIS — D631 Anemia in chronic kidney disease: Secondary | ICD-10-CM | POA: Diagnosis not present

## 2021-02-26 DIAGNOSIS — N2581 Secondary hyperparathyroidism of renal origin: Secondary | ICD-10-CM | POA: Diagnosis not present

## 2021-02-26 DIAGNOSIS — D509 Iron deficiency anemia, unspecified: Secondary | ICD-10-CM | POA: Diagnosis not present

## 2021-02-26 DIAGNOSIS — Z992 Dependence on renal dialysis: Secondary | ICD-10-CM | POA: Diagnosis not present

## 2021-02-26 DIAGNOSIS — N186 End stage renal disease: Secondary | ICD-10-CM | POA: Diagnosis not present

## 2021-02-28 DIAGNOSIS — D509 Iron deficiency anemia, unspecified: Secondary | ICD-10-CM | POA: Diagnosis not present

## 2021-02-28 DIAGNOSIS — D631 Anemia in chronic kidney disease: Secondary | ICD-10-CM | POA: Diagnosis not present

## 2021-02-28 DIAGNOSIS — N2581 Secondary hyperparathyroidism of renal origin: Secondary | ICD-10-CM | POA: Diagnosis not present

## 2021-02-28 DIAGNOSIS — N186 End stage renal disease: Secondary | ICD-10-CM | POA: Diagnosis not present

## 2021-02-28 DIAGNOSIS — Z992 Dependence on renal dialysis: Secondary | ICD-10-CM | POA: Diagnosis not present

## 2021-03-03 DIAGNOSIS — N2581 Secondary hyperparathyroidism of renal origin: Secondary | ICD-10-CM | POA: Diagnosis not present

## 2021-03-03 DIAGNOSIS — D509 Iron deficiency anemia, unspecified: Secondary | ICD-10-CM | POA: Diagnosis not present

## 2021-03-03 DIAGNOSIS — Z992 Dependence on renal dialysis: Secondary | ICD-10-CM | POA: Diagnosis not present

## 2021-03-03 DIAGNOSIS — D631 Anemia in chronic kidney disease: Secondary | ICD-10-CM | POA: Diagnosis not present

## 2021-03-03 DIAGNOSIS — N186 End stage renal disease: Secondary | ICD-10-CM | POA: Diagnosis not present

## 2021-03-04 ENCOUNTER — Other Ambulatory Visit: Payer: Self-pay

## 2021-03-04 ENCOUNTER — Encounter: Payer: Self-pay | Admitting: Physical Therapy

## 2021-03-04 ENCOUNTER — Ambulatory Visit: Payer: Medicare Other | Admitting: Physical Therapy

## 2021-03-04 DIAGNOSIS — N8189 Other female genital prolapse: Secondary | ICD-10-CM | POA: Diagnosis not present

## 2021-03-04 DIAGNOSIS — M6281 Muscle weakness (generalized): Secondary | ICD-10-CM

## 2021-03-04 DIAGNOSIS — R278 Other lack of coordination: Secondary | ICD-10-CM | POA: Diagnosis not present

## 2021-03-04 NOTE — Therapy (Signed)
[facility] @ [place], [place], [address] Phone: [phone]   Fax:  [phone]  Physical Therapy Treatment  Patient Details  Name: Katie Nunez MRN: [id number] Date of Birth: [birth year] Referring Provider (PT): Dr. [name]   Encounter Date: 03/04/2021   PT End of Session - 03/04/21 1503     Visit Number 2    Date for PT Re-Evaluation 04/13/21    Authorization Type UHC medicare    Authorization - Visit Number 2    Authorization - Number of Visits 10    PT Start Time 1500   came late   PT Stop Time 1525    PT Time Calculation (min) 25 min    Activity Tolerance Patient tolerated treatment well    Behavior During Therapy [facility] for tasks assessed/performed             Past Medical History:  Diagnosis Date   Achalasia    Anxiety    Dysplasia of cervix, low grade (CIN 1)    Environmental allergies    "all year long" (12/27/2016)   ESRD (end stage renal disease) on dialysis ([place])    "TTS; [place]" (12/27/2016)   Fibromyalgia    GERD (gastroesophageal reflux disease)    Gout    Hypertension    IBS (irritable bowel syndrome)    MVP (mitral valve prolapse)    RA (rheumatoid arthritis) ([place])    FOLLOWED BY DR. SHANAHAN   Raynaud's disease    Scleroderma ([place])    Seasonal allergies    Thrombocytopenia ([place]) 07/01/2016   Acute fall to 13,000 07/01/16   Tubular adenoma 01/08/2008   CECUM   Vitamin D deficiency     Past Surgical History:  Procedure Laterality Date   ANKLE FRACTURE SURGERY Right    AV FISTULA PLACEMENT Left 06/28/2016   Procedure: left arm ARTERIOVENOUS (AV) FISTULA CREATION;  Surgeon: Rosetta Posner, MD;  [facility];  Service: Vascular;  Laterality: Left;   [place] Left 09/27/2016   Procedure: LEFT UPPER ARM CEPHALIC VEIN TRANSPOSITION;  Surgeon: Rosetta Posner, MD;  [facility];  Service: Vascular;  Laterality: Left;   BREAST BIOPSY     "? side"    [place]     COLONOSCOPY W/ BIOPSIES  01/08/2008   INSERTION OF DIALYSIS CATHETER Right 06/28/2016   Procedure: INSERTION OF DIALYSIS CATHETER, right internal jugular;  Surgeon: Rosetta Posner, MD;  Location: [facility];  Service: Vascular;  Laterality: Right;   MYOMECTOMY     NASAL ENDOSCOPY WITH EPISTAXIS CONTROL N/A 12/29/2019   Procedure: NASAL ENDOSCOPY WITH EPISTAXIS CONTROL;  Surgeon: Leta Baptist, MD;  Location: [facility];  Service: ENT;  Laterality: N/A;   PELVIC LAPAROSCOPY  2011   superficial thrombophlebitis Left 07-2014    There were no vitals filed for this visit.   Subjective Assessment - 03/04/21 1503     Subjective I am about the same. I feel the stool is at the edge of the rectum.    Patient Stated Goals learn how to expel the stool without putting had no tailbone to push the stool out    Currently in Pain? No/denies    Multiple Pain Sites No                            Pelvic Floor Special Questions - 03/04/21  0001     Pelvic Floor Internal Exam Patient confirms identification and approves PT to assess pelvic floor and treatment    Exam Type Rectal    Strength fair squeeze, definite lift   3 contractions at 4/5, 5 contractions at 3/5              [facility] Adult PT Treatment/Exercise - 03/04/21 0001       Neuro Re-ed    Neuro Re-ed Details  tactile cues to the IAS and EAS to contract in a circular motion in sidely      Manual Therapy   Manual Therapy Soft tissue mobilization;Internal Pelvic Floor    Soft tissue mobilization manual work to the perineal body and outside of the anus    Internal Pelvic Floor manual work to the puborectalis, along the coccy, distraction of the coccyx, manual work to the anococcygeal ligament                     PT Education - 03/04/21 1529     Education Details Access Code: [email or web address]    Person(s) Educated Patient    Methods Explanation;Demonstration;Verbal cues;Handout     Comprehension Returned demonstration;Verbalized understanding              PT Short Term Goals - 02/16/21 1621       PT SHORT TERM GOAL #1   Title Patient will demonstrate independent use of home exercise program including pelvic floor exercises    Baseline ---    Time 4    Period Weeks    Status New    Target Date 03/16/21      PT SHORT TERM GOAL #2   Title ---    Baseline ---               PT Long Term Goals - 02/16/21 1621       PT LONG TERM GOAL #1   Title Patient will demonstrate advanced HEP for pelvic floor coordination and strength    Baseline --    Time 8    Period Weeks    Status New    Target Date 04/13/21      PT LONG TERM GOAL #2   Title Patient will be able to have a bowel movement without straining or pushing by the tailbone to fully empty her stool    Baseline ----    Time 8    Period Weeks    Status New    Target Date 04/13/21      PT LONG TERM GOAL #3   Title Patient will report her stool leakage has improved >/= 75% due to improved pelvic floor coordination    Baseline ---    Time 8    Period Weeks    Status New    Target Date 04/13/21      PT LONG TERM GOAL #4   Title Patient will be able to demonstrate correct  toileting using the squatty potty to push stool out and not having to assist    Baseline ----    Time 8    Period Weeks    Status New    Target Date 04/13/21      PT LONG TERM GOAL #5   Title ----    Baseline ----                   Plan - 03/04/21 1504     Clinical Impression Statement Patient was late due to her  having to go to the bathroom. Patient had fascial restrictions of the puborectalis and along the coccyx. After the manual work was done she was able to contract 3 times at 4/5 strength and 5 tiems at 3/5 strength. Patient has some difficulty with relaxing the pelvic floor after contraction. She feels stool at the rectum but may due to the sensitivity she has in that area and exercise will help  with that. Patient will benefit from skilled therapy to work on pelvic floor coordination to improve stoool emptying and reduce fecal leakage.    Personal Factors and Comorbidities Comorbidity 3+;Time since onset of injury/illness/exacerbation;Past/Current Experience;Fitness    Comorbidities Stroke 12/2019; End stage renal disease; Raynauds disease; Sclerodema; RA; Pelvic laproscopy for myomectomy 2011; Dialyssis; C-section    Examination-Activity Limitations Continence;Toileting    Stability/Clinical Decision Making Stable/Uncomplicated    Rehab Potential Good    PT Frequency 1x / week    PT Duration 8 weeks    PT Treatment/Interventions ADLs/Self Care Home Management;Biofeedback;Therapeutic activities;Therapeutic exercise;Neuromuscular re-education;Patient/family education;Manual techniques;Spinal Manipulations    PT Next Visit Plan work on coccyx, pushing finger out of anus; toileting with squatty potty, contract after bowel movement; sidely with pelvic floor contraction and push into ball,    PT Home Exercise Plan Access Code: [email or web address]    Consulted and Agree with Plan of Care Patient             Patient will benefit from skilled therapeutic intervention in order to improve the following deficits and impairments:  Decreased coordination, Increased fascial restricitons, Decreased activity tolerance, Decreased strength  Visit Diagnosis: Muscle weakness (generalized)  Other lack of coordination     Problem List Patient Active Problem List   Diagnosis Date Noted   Neck pain 10/30/2020   Knee locking, left 10/05/2020   Abnormality of gait 02/25/2020   Protein-calorie malnutrition, severe 01/19/2020   Epistaxis    Sleep disturbance    Slow transit constipation    Hemorrhoids    Chronic systolic congestive heart failure ([place])    ESRD on dialysis ([place])    Right hemiparesis ([place])    Pressure injury of skin 12/20/2019   Intraparenchymal hemorrhage of brain ([place]) 12/18/2019   ICH  (intracerebral hemorrhage) ([place]) 12/12/2019   Viral disease 08/22/2019   Chronic right-sided heart failure ([place]) 07/09/2019   Supraventricular tachycardia ([place]) 07/09/2019   Other cirrhosis of liver ([place]) 06/27/2019   Heart failure ([place]) 02/21/2019   Heart palpitations 08/14/2018   Other fluid overload 07/27/2018   Encounter for long-term (current) use of other medications 01/18/2018   Hypothyroidism 01/18/2018   Myalgia and myositis 01/18/2018   Chronic nephritis 01/18/2018   Other long term (current) drug therapy 01/18/2018   Sleep apnea 01/18/2018   Unspecified persistent mental disorders due to conditions classified elsewhere 01/18/2018   Venous reflux 01/18/2018   Vitamin B12 deficiency 01/18/2018   Vitamin D deficiency 01/18/2018   Obstructive lung disease ([place]) 12/30/2017   Other pruritus 12/27/2017   Eruption cyst 12/19/2017   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 ([place]) 11/28/2017   Chronic cough 11/11/2017   Pulmonary hypertension ([place]) 11/11/2017   Pulmonary arterial hypertension ([place]) 10/07/2017   Acute ITP ([place]) 01/05/2017   ESRD (end stage renal disease) ([place]) 12/28/2016   Encounter for removal of sutures 09/09/2016   Coagulation defect, unspecified ([place]) 09/01/2016   Underimmunization status 08/05/2016   Hemolytic anemia ([place]) 07/26/2016   Epistaxis, recurrent 07/26/2016   Unspecified protein-calorie malnutrition ([place]) 07/13/2016   Aftercare including  intermittent dialysis ([place]) 07/07/2016   Anemia in chronic kidney disease 07/07/2016   Hypokalemia 07/07/2016   Iron deficiency anemia, unspecified 07/07/2016   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   Nonrheumatic mitral (valve) prolapse 07/07/2016   Other irritable bowel syndrome 07/07/2016   Other secondary thrombocytopenia 07/07/2016   Secondary hyperparathyroidism of renal origin ([place]) 07/07/2016   Thrombocytopenia ([place]) 07/01/2016   ARF (acute renal failure) ([place]) 06/25/2016    Anxiety 06/25/2016   Bilateral carpal tunnel syndrome 10/22/2015   Chronic gout without tophus 10/22/2015   Chronic nonintractable headache 10/08/2015   Fibroid uterus 01/03/2012   H/O vitamin D deficiency 01/03/2012   Post-menopausal 01/03/2012   Hereditary and idiopathic peripheral neuropathy 11/19/2011   Intestinal malabsorption 70/14/1030   Lichen planus 13/14/3888   Low back pain 11/19/2011   Diffuse spasm of esophagus 11/11/2011   ESR raised 11/11/2011   Postinflammatory pulmonary fibrosis ([place]) 11/11/2011   Scleroderma ([place]) 11/16/2010   Rheumatoid arthritis ([place]) 11/16/2010   Raynaud's disease 11/16/2010   Symptomatic menopausal or female climacteric states 11/16/2010    Earlie Counts, PT 03/04/21 3:33 PM  [facility] @ [facility] [place], [place], [address] Phone: [phone]   Fax:  [phone]  Name: Katie Nunez MRN: [id number] Date of Birth: 13-Sep-1955

## 2021-03-04 NOTE — Patient Instructions (Signed)
Access Code: [email or web address] URL: [email or web address] Date: 03/04/2021 Prepared by: Earlie Counts  Exercises Sidelying Pelvic Floor Contraction with Self-Palpation - 2-4 x daily - 7 x weekly - 1 sets - 5 reps [facility] [address] [place], [place] [address] Phone # [phone] Fax [phone]

## 2021-03-05 DIAGNOSIS — N2581 Secondary hyperparathyroidism of renal origin: Secondary | ICD-10-CM | POA: Diagnosis not present

## 2021-03-05 DIAGNOSIS — D509 Iron deficiency anemia, unspecified: Secondary | ICD-10-CM | POA: Diagnosis not present

## 2021-03-05 DIAGNOSIS — N186 End stage renal disease: Secondary | ICD-10-CM | POA: Diagnosis not present

## 2021-03-05 DIAGNOSIS — D631 Anemia in chronic kidney disease: Secondary | ICD-10-CM | POA: Diagnosis not present

## 2021-03-05 DIAGNOSIS — Z992 Dependence on renal dialysis: Secondary | ICD-10-CM | POA: Diagnosis not present

## 2021-03-07 DIAGNOSIS — N2581 Secondary hyperparathyroidism of renal origin: Secondary | ICD-10-CM | POA: Diagnosis not present

## 2021-03-07 DIAGNOSIS — N186 End stage renal disease: Secondary | ICD-10-CM | POA: Diagnosis not present

## 2021-03-07 DIAGNOSIS — D631 Anemia in chronic kidney disease: Secondary | ICD-10-CM | POA: Diagnosis not present

## 2021-03-07 DIAGNOSIS — Z992 Dependence on renal dialysis: Secondary | ICD-10-CM | POA: Diagnosis not present

## 2021-03-07 DIAGNOSIS — D509 Iron deficiency anemia, unspecified: Secondary | ICD-10-CM | POA: Diagnosis not present

## 2021-03-09 ENCOUNTER — Ambulatory Visit: Payer: Medicare Other | Admitting: Podiatrist

## 2021-03-10 DIAGNOSIS — D631 Anemia in chronic kidney disease: Secondary | ICD-10-CM | POA: Diagnosis not present

## 2021-03-10 DIAGNOSIS — N269 Renal sclerosis, unspecified: Secondary | ICD-10-CM | POA: Diagnosis not present

## 2021-03-10 DIAGNOSIS — N2581 Secondary hyperparathyroidism of renal origin: Secondary | ICD-10-CM | POA: Diagnosis not present

## 2021-03-10 DIAGNOSIS — D509 Iron deficiency anemia, unspecified: Secondary | ICD-10-CM | POA: Diagnosis not present

## 2021-03-10 DIAGNOSIS — N186 End stage renal disease: Secondary | ICD-10-CM | POA: Diagnosis not present

## 2021-03-10 DIAGNOSIS — Z992 Dependence on renal dialysis: Secondary | ICD-10-CM | POA: Diagnosis not present

## 2021-03-11 ENCOUNTER — Ambulatory Visit: Payer: Medicare Other | Attending: Family Medicine | Admitting: Physical Therapy

## 2021-03-11 ENCOUNTER — Other Ambulatory Visit: Payer: Self-pay

## 2021-03-11 ENCOUNTER — Encounter: Payer: Self-pay | Admitting: Physical Therapy

## 2021-03-11 DIAGNOSIS — M6281 Muscle weakness (generalized): Secondary | ICD-10-CM | POA: Diagnosis not present

## 2021-03-11 DIAGNOSIS — R278 Other lack of coordination: Secondary | ICD-10-CM | POA: Diagnosis not present

## 2021-03-11 NOTE — Patient Instructions (Signed)
Toileting Techniques for Bowel Movements ? ? ? ?An Evacuation/Defecation Plan   Here are the 4 basic points: ? ?Lean forward enough for your elbows to rest on your knees ?Support your feet on the floor or use a low stool if your feet don't touch the floor  ?Push out your belly as if you have swallowed a beach ball--you should feel a widening of your waist. "Belly Big, Belly Hard" ?Open and relax your pelvic floor muscles, rather than tightening around the anus ? ?While you are sitting on the toilet pay attention to the following areas: ?Jaw and mouth position- relaxed not clenched ?Angle of your hips - leaning slightly forward ?Whether your feet touch the ground or not - should be flat and supported ?Arm placement - rest against your thighs ?Spine position - flat back ?Waist ?Breathing - exhale as you push (like blowing up a balloon or try using other sounds such as ahhhh, shhhhh, ohhhh or grrrrrrr) ?Belly - hard and tight as you push ?Anus (opening of the anal canal) - relaxed and open as you push ?Anus - Tighten and lift pulling the muscle back in after you are done or if taking a break ? ?If you are not successful after 10-15 minutes, try again later.  Avoid negative self-talk about your toileting experience. ? ? ?Read this for more details and ask your PT if you need suggestions for adjustments or limitations: ? ?Sitting on the toilet  ?a) Make sure your feet are supported - flat on the floor or step stool ?b) Many people find it effective to lean forward or raise their knees.  Propping your feet on a step stool (Squatty Potty is a brand name) can help the muscles around the anus to relax  ?c) When you lean forward, place your forearms on your thighs for support ? ?Relaxing ?Breathe deeply and slowly in through your nose and out through your mouth. ?To become aware of how to relax your muscles, contracting and releasing muscles can be helpful.  Pull your pelvic floor muscles in tightly by using the image of  holding back gas, or closing around the anus (visualize making a circle smaller) and lifting the anus up and in.  Then release the muscles and your anus should drop down and feel open. Repeat 5 times ending with the feeling of relaxation. ?Keep your pelvic floor muscles relaxed; let your belly bulge out. ?The digestive tract starts at the mouth and ends at the anal opening, so be sure to relax both ends of the tube.  Place your tongue on the roof of your mouth with your teeth separated.  This helps relax your mouth and will help to relax the anus at the same time. ? ?Emptying (defecation) ?a) Keep your pelvic floor and sphincter relaxed, then bulge your anal muscles.  Make the anal opening wide.  ?b) Stick your belly out as if you have swallowed a beach ball. ?c) Make your belly wall hard using your belly muscles while continuing to breathe. Doing this makes it easier to open your anus. ?d) Breath out and give a grunt (or try using other sounds such as ahhhh, shhhhh, ohhhh or grrrrrrr). ?e)  Can also try to act as if you are blowing up a balloon as you push ? ?4) Finishing ?a) As you finish your bowel movement, pull the pelvic floor muscles up and in.  This will leave your anus in the proper place rather than remaining pushed out and down. If  you leave your anus pushed out and down, it will start to feel as though that is normal and give you incorrect signals about needing to have a bowel movement.  ?

## 2021-03-11 NOTE — Therapy (Signed)
[place] @ [facility] [place], [place], [address] Phone: [phone]   Fax:  [phone]  Physical Therapy Treatment  Patient Details  Name: Katie Nunez MRN: [id number] Date of Birth: 10-08-1955 Referring Provider (PT): Dr. [name]   Encounter Date: 03/11/2021   PT End of Session - 03/11/21 1535     Visit Number 3    Date for PT Re-Evaluation 04/13/21    Authorization Type [employer] medicare    Authorization - Visit Number 3    Authorization - Number of Visits 10    PT Start Time 7673    PT Stop Time 1610    PT Time Calculation (min) 40 min    Activity Tolerance Patient tolerated treatment well    Behavior During Therapy [facility] for tasks assessed/performed             Past Medical History:  Diagnosis Date   Achalasia    Anxiety    Dysplasia of cervix, low grade (CIN 1)    Environmental allergies    "all year long" (12/27/2016)   ESRD (end stage renal disease) on dialysis ([place])    "TTS; [place]" (12/27/2016)   Fibromyalgia    GERD (gastroesophageal reflux disease)    Gout    Hypertension    IBS (irritable bowel syndrome)    MVP (mitral valve prolapse)    RA (rheumatoid arthritis) ([place])    FOLLOWED BY DR. SHANAHAN   Raynaud's disease    Scleroderma ([place])    Seasonal allergies    Thrombocytopenia ([place]) 07/01/2016   Acute fall to 13,000 07/01/16   Tubular adenoma 01/08/2008   CECUM   Vitamin D deficiency     Past Surgical History:  Procedure Laterality Date   ANKLE FRACTURE SURGERY Right    AV FISTULA PLACEMENT Left 06/28/2016   Procedure: left arm ARTERIOVENOUS (AV) FISTULA CREATION;  Surgeon: Rosetta Posner, MD;  Location: [facility] OR;  Service: Vascular;  Laterality: Left;   [place] Left 09/27/2016   Procedure: LEFT UPPER ARM CEPHALIC VEIN TRANSPOSITION;  Surgeon: Rosetta Posner, MD;  Location: [facility] OR;  Service: Vascular;  Laterality: Left;   BREAST BIOPSY     "? side"   [place]     COLONOSCOPY W/ BIOPSIES  01/08/2008   INSERTION OF DIALYSIS CATHETER Right 06/28/2016   Procedure: INSERTION OF DIALYSIS CATHETER, right internal jugular;  Surgeon: Rosetta Posner, MD;  Location: [facility];  Service: Vascular;  Laterality: Right;   MYOMECTOMY     NASAL ENDOSCOPY WITH EPISTAXIS CONTROL N/A 12/29/2019   Procedure: NASAL ENDOSCOPY WITH EPISTAXIS CONTROL;  Surgeon: Leta Baptist, MD;  Location: [place];  Service: ENT;  Laterality: N/A;   PELVIC LAPAROSCOPY  2011   superficial thrombophlebitis Left 07-2014    There were no vitals filed for this visit.   Subjective Assessment - 03/11/21 1535     Subjective The therapy helped a little bit. The bowel came out without me having to help it. I do not feel I have the strength to push all of the stool out.    Patient Stated Goals learn how to expel the stool without putting had no tailbone to push the stool out    Currently in Pain? No/denies    Multiple Pain Sites No  [place] Adult PT Treatment/Exercise - 03/11/21 0001       Therapeutic Activites    Therapeutic Activities Other Therapeutic Activities    Other Therapeutic Activities educated patient on how to sit on the commode correctly for a bowel movement, knees above hips, breathing correctly, and pushing the stool out      Neuro Re-ed    Neuro Re-ed Details  diaphragmatic breathing in sidely and sitting with tactile and verbal cues to expand the abdomen      Lumbar Exercises: Seated   Other Seated Lumbar Exercises sitting with fingers on the levator ani and anterior to the coccyx to release the anococcygeal ligament with anterior/posterior movement and pelvic sway    Other Seated Lumbar Exercises seated hip flexion isometric holding for 5 seconds 10x each leg      Lumbar Exercises: Sidelying   Other Sidelying Lumbar Exercises sidely with pushing ball with top hand, contract the lower abdomen,  squeeze towel to contract the anus 5 sec 15 times      Manual Therapy   Manual Therapy Soft tissue mobilization    Soft tissue mobilization manual work to the coccygeus in right sidely                       PT Short Term Goals - 03/11/21 1613       PT SHORT TERM GOAL #1   Title Patient will demonstrate independent use of home exercise program including pelvic floor exercises    Time 4    Period Weeks    Status On-going    Target Date 03/16/21               PT Long Term Goals - 02/16/21 1621       PT LONG TERM GOAL #1   Title Patient will demonstrate advanced HEP for pelvic floor coordination and strength    Baseline --    Time 8    Period Weeks    Status New    Target Date 04/13/21      PT LONG TERM GOAL #2   Title Patient will be able to have a bowel movement without straining or pushing by the tailbone to fully empty her stool    Baseline ----    Time 8    Period Weeks    Status New    Target Date 04/13/21      PT LONG TERM GOAL #3   Title Patient will report her stool leakage has improved >/= 75% due to improved pelvic floor coordination    Baseline ---    Time 8    Period Weeks    Status New    Target Date 04/13/21      PT LONG TERM GOAL #4   Title Patient will be able to demonstrate correct  toileting using the squatty potty to push stool out and not having to assist    Baseline ----    Time 8    Period Weeks    Status New    Target Date 04/13/21      PT LONG TERM GOAL #5   Title ----    Baseline ----                   Plan - 03/11/21 1609     Clinical Impression Statement Patient is able to perform diaphragmatic breathing to push stool out in therapy. She is understanding correct toileting technique. She feels stool on the left side  of the coccyx but it may be a muscle spasm. Patient is learning how to contract her abdominals to push the stool out easier. Patient will benefit from skilled therapy to work on pelvic floor  coordination to improve stool emptying and reduce fecal leakage.    Personal Factors and Comorbidities Comorbidity 3+;Time since onset of injury/illness/exacerbation;Past/Current Experience;Fitness    Comorbidities Stroke 12/2019; End stage renal disease; Raynauds disease; Sclerodema; RA; Pelvic laproscopy for myomectomy 2011; Dialyssis; C-section    Examination-Activity Limitations Continence;Toileting    Stability/Clinical Decision Making Stable/Uncomplicated    Rehab Potential Good    PT Frequency 1x / week    PT Duration 8 weeks    PT Treatment/Interventions ADLs/Self Care Home Management;Biofeedback;Therapeutic activities;Therapeutic exercise;Neuromuscular re-education;Patient/family education;Manual techniques;Spinal Manipulations    PT Next Visit Plan work on coccyx, pushing finger out of anus; see how toileting is;  contract after bowel movement; supine push hands into bed; work around the coccyx    PT Home Exercise Plan Access Code: [place] and Agree with Plan of Care Patient             Patient will benefit from skilled therapeutic intervention in order to improve the following deficits and impairments:  Decreased coordination, Increased fascial restricitons, Decreased activity tolerance, Decreased strength  Visit Diagnosis: Muscle weakness (generalized)  Other lack of coordination     Problem List Patient Active Problem List   Diagnosis Date Noted   Neck pain 10/30/2020   Knee locking, left 10/05/2020   Abnormality of gait 02/25/2020   Protein-calorie malnutrition, severe 01/19/2020   Epistaxis    Sleep disturbance    Slow transit constipation    Hemorrhoids    Chronic systolic congestive heart failure ([place])    ESRD on dialysis ([place])    Right hemiparesis ([place])    Pressure injury of skin 12/20/2019   Intraparenchymal hemorrhage of brain ([place]) 12/18/2019   ICH (intracerebral hemorrhage) ([place]) 12/12/2019   Viral disease 08/22/2019   Chronic  right-sided heart failure ([place]) 07/09/2019   Supraventricular tachycardia ([place]) 07/09/2019   Other cirrhosis of liver ([place]) 06/27/2019   Heart failure ([place]) 02/21/2019   Heart palpitations 08/14/2018   Other fluid overload 07/27/2018   Encounter for long-term (current) use of other medications 01/18/2018   Hypothyroidism 01/18/2018   Myalgia and myositis 01/18/2018   Chronic nephritis 01/18/2018   Other long term (current) drug therapy 01/18/2018   Sleep apnea 01/18/2018   Unspecified persistent mental disorders due to conditions classified elsewhere 01/18/2018   Venous reflux 01/18/2018   Vitamin B12 deficiency 01/18/2018   Vitamin D deficiency 01/18/2018   Obstructive lung disease ([place]) 12/30/2017   Other pruritus 12/27/2017   Eruption cyst 12/19/2017   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 ([place]) 11/28/2017   Chronic cough 11/11/2017   Pulmonary hypertension ([place]) 11/11/2017   Pulmonary arterial hypertension ([place]) 10/07/2017   Acute ITP ([place]) 01/05/2017   ESRD (end stage renal disease) ([place]) 12/28/2016   Encounter for removal of sutures 09/09/2016   Coagulation defect, unspecified ([place]) 09/01/2016   Underimmunization status 08/05/2016   Hemolytic anemia ([place]) 07/26/2016   Epistaxis, recurrent 07/26/2016   Unspecified protein-calorie malnutrition ([place]) 07/13/2016   Aftercare including intermittent dialysis ([place]) 07/07/2016   Anemia in chronic kidney disease 07/07/2016   Hypokalemia 07/07/2016   Iron deficiency anemia, unspecified 07/07/2016   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   Nonrheumatic mitral (valve) prolapse 07/07/2016   Other irritable bowel syndrome 07/07/2016   Other secondary thrombocytopenia 07/07/2016  Secondary hyperparathyroidism of renal origin ([place]) 07/07/2016   Thrombocytopenia ([place]) 07/01/2016   ARF (acute renal failure) ([place]) 06/25/2016   Anxiety 06/25/2016   Bilateral carpal tunnel syndrome 10/22/2015   Chronic gout  without tophus 10/22/2015   Chronic nonintractable headache 10/08/2015   Fibroid uterus 01/03/2012   H/O vitamin D deficiency 01/03/2012   Post-menopausal 01/03/2012   Hereditary and idiopathic peripheral neuropathy 11/19/2011   Intestinal malabsorption 62/95/2841   Lichen planus 32/44/0102   Low back pain 11/19/2011   Diffuse spasm of esophagus 11/11/2011   ESR raised 11/11/2011   Postinflammatory pulmonary fibrosis ([place]) 11/11/2011   Scleroderma ([place]) 11/16/2010   Rheumatoid arthritis ([place]) 11/16/2010   Raynaud's disease 11/16/2010   Symptomatic menopausal or female climacteric states 11/16/2010    Earlie Counts, PT 03/11/21 4:14 PM  [place] @ [facility] [place], [place], [address] Phone: [phone]   Fax:  [phone]  Name: Katie Nunez MRN: [id number] Date of Birth: 1955/02/16

## 2021-03-12 DIAGNOSIS — N952 Postmenopausal atrophic vaginitis: Secondary | ICD-10-CM | POA: Insufficient documentation

## 2021-03-12 DIAGNOSIS — N186 End stage renal disease: Secondary | ICD-10-CM | POA: Diagnosis not present

## 2021-03-12 DIAGNOSIS — Z992 Dependence on renal dialysis: Secondary | ICD-10-CM | POA: Diagnosis not present

## 2021-03-12 DIAGNOSIS — K5902 Outlet dysfunction constipation: Secondary | ICD-10-CM | POA: Insufficient documentation

## 2021-03-12 DIAGNOSIS — D509 Iron deficiency anemia, unspecified: Secondary | ICD-10-CM | POA: Diagnosis not present

## 2021-03-12 DIAGNOSIS — D631 Anemia in chronic kidney disease: Secondary | ICD-10-CM | POA: Diagnosis not present

## 2021-03-12 DIAGNOSIS — N2581 Secondary hyperparathyroidism of renal origin: Secondary | ICD-10-CM | POA: Diagnosis not present

## 2021-03-14 DIAGNOSIS — Z992 Dependence on renal dialysis: Secondary | ICD-10-CM | POA: Diagnosis not present

## 2021-03-14 DIAGNOSIS — N186 End stage renal disease: Secondary | ICD-10-CM | POA: Diagnosis not present

## 2021-03-14 DIAGNOSIS — D631 Anemia in chronic kidney disease: Secondary | ICD-10-CM | POA: Diagnosis not present

## 2021-03-14 DIAGNOSIS — N2581 Secondary hyperparathyroidism of renal origin: Secondary | ICD-10-CM | POA: Diagnosis not present

## 2021-03-14 DIAGNOSIS — D509 Iron deficiency anemia, unspecified: Secondary | ICD-10-CM | POA: Diagnosis not present

## 2021-03-17 DIAGNOSIS — D509 Iron deficiency anemia, unspecified: Secondary | ICD-10-CM | POA: Diagnosis not present

## 2021-03-17 DIAGNOSIS — Z992 Dependence on renal dialysis: Secondary | ICD-10-CM | POA: Diagnosis not present

## 2021-03-17 DIAGNOSIS — N2581 Secondary hyperparathyroidism of renal origin: Secondary | ICD-10-CM | POA: Diagnosis not present

## 2021-03-17 DIAGNOSIS — N186 End stage renal disease: Secondary | ICD-10-CM | POA: Diagnosis not present

## 2021-03-17 DIAGNOSIS — D631 Anemia in chronic kidney disease: Secondary | ICD-10-CM | POA: Diagnosis not present

## 2021-03-18 ENCOUNTER — Ambulatory Visit: Payer: Medicare Other | Admitting: Physical Therapy

## 2021-03-18 ENCOUNTER — Encounter: Payer: Self-pay | Admitting: Physical Therapy

## 2021-03-18 ENCOUNTER — Other Ambulatory Visit: Payer: Self-pay

## 2021-03-18 DIAGNOSIS — D509 Iron deficiency anemia, unspecified: Secondary | ICD-10-CM | POA: Diagnosis not present

## 2021-03-18 DIAGNOSIS — N2581 Secondary hyperparathyroidism of renal origin: Secondary | ICD-10-CM | POA: Diagnosis not present

## 2021-03-18 DIAGNOSIS — R278 Other lack of coordination: Secondary | ICD-10-CM | POA: Diagnosis not present

## 2021-03-18 DIAGNOSIS — Z992 Dependence on renal dialysis: Secondary | ICD-10-CM | POA: Diagnosis not present

## 2021-03-18 DIAGNOSIS — M6281 Muscle weakness (generalized): Secondary | ICD-10-CM | POA: Diagnosis not present

## 2021-03-18 DIAGNOSIS — J0100 Acute maxillary sinusitis, unspecified: Secondary | ICD-10-CM | POA: Diagnosis not present

## 2021-03-18 DIAGNOSIS — N186 End stage renal disease: Secondary | ICD-10-CM | POA: Diagnosis not present

## 2021-03-18 DIAGNOSIS — D631 Anemia in chronic kidney disease: Secondary | ICD-10-CM | POA: Diagnosis not present

## 2021-03-18 NOTE — Patient Instructions (Signed)
Access Code: [email or web address] ?URL: [email or web address] ?Date: 03/18/2021 ?Prepared by: Earlie Counts ? ?Exercises ?Sidelying Pelvic Floor Contraction with Self-Palpation - 2-4 x daily - 7 x weekly - 1 sets - 5 reps ?Hooklying Isometric Hip Flexion - 1 x daily - 7 x weekly - 2 sets - 10 reps - 5 sec hold ?Supine Diaphragmatic Breathing - 2 x daily - 7 x weekly - 1 sets - 10 reps ?Seated Diaphragmatic Breathing - 2 x daily - 7 x weekly - 1 sets - 10 reps ?Seated Sidebending - 1 x daily - 7 x weekly - 2 sets - 10 reps ?[place] [facility] ?[place], [place] [address] ?Phone # [phone] ?Fax [phone] ? ?

## 2021-03-18 NOTE — Therapy (Signed)
[facility] @ [place], [place], [address] Phone: [phone]   Fax:  [phone]  Physical Therapy Treatment  Patient Details  Name: SHAKEETA GODETTE MRN: [id number] Date of Birth: [birth year] Referring Provider (PT): Dr. [name]   Encounter Date: 03/18/2021   PT End of Session - 03/18/21 1452     Visit Number 4    Date for PT Re-Evaluation 04/13/21    Authorization Type [employer] medicare    Authorization - Visit Number 4    Authorization - Number of Visits 10    PT Start Time 1638    PT Stop Time 1525    PT Time Calculation (min) 40 min    Activity Tolerance Patient tolerated treatment well    Behavior During Therapy [facility] for tasks assessed/performed             Past Medical History:  Diagnosis Date   Achalasia    Anxiety    Dysplasia of cervix, low grade (CIN 1)    Environmental allergies    "all year long" (12/27/2016)   ESRD (end stage renal disease) on dialysis ([place])    "TTS; [place]" (12/27/2016)   Fibromyalgia    GERD (gastroesophageal reflux disease)    Gout    Hypertension    IBS (irritable bowel syndrome)    MVP (mitral valve prolapse)    RA (rheumatoid arthritis) ([place])    FOLLOWED BY DR. SHANAHAN   Raynaud's disease    Scleroderma ([place])    Seasonal allergies    Thrombocytopenia ([place]) 07/01/2016   Acute fall to 13,000 07/01/16   Tubular adenoma 01/08/2008   CECUM   Vitamin D deficiency     Past Surgical History:  Procedure Laterality Date   ANKLE FRACTURE SURGERY Right    AV FISTULA PLACEMENT Left 06/28/2016   Procedure: left arm ARTERIOVENOUS (AV) FISTULA CREATION;  Surgeon: Rosetta Posner, MD;  Location: [facility] OR;  Service: Vascular;  Laterality: Left;   [place] Left 09/27/2016   Procedure: LEFT UPPER ARM CEPHALIC VEIN TRANSPOSITION;  Surgeon: Rosetta Posner, MD;  Location: [facility] OR;  Service: Vascular;  Laterality: Left;   BREAST BIOPSY     "? side"   [place]     COLONOSCOPY W/ BIOPSIES  01/08/2008   INSERTION OF DIALYSIS CATHETER Right 06/28/2016   Procedure: INSERTION OF DIALYSIS CATHETER, right internal jugular;  Surgeon: Rosetta Posner, MD;  Location: [facility];  Service: Vascular;  Laterality: Right;   MYOMECTOMY     NASAL ENDOSCOPY WITH EPISTAXIS CONTROL N/A 12/29/2019   Procedure: NASAL ENDOSCOPY WITH EPISTAXIS CONTROL;  Surgeon: Leta Baptist, MD;  Location: [facility];  Service: ENT;  Laterality: N/A;   PELVIC LAPAROSCOPY  2011   superficial thrombophlebitis Left 07-2014    There were no vitals filed for this visit.   Subjective Assessment - 03/18/21 1452     Subjective I saw the urogynecologist and feels I do not need surgery. She wants me to get more fiber in me. MD wants me to get the stool firmer.    Patient Stated Goals learn how to expel the stool without putting had no tailbone to push the stool out    Currently in Pain? No/denies    Multiple Pain Sites No  [facility] Adult PT Treatment/Exercise - 03/18/21 0001       Neuro Re-ed    Neuro Re-ed Details  diaphragmatic breathing in supine, sidely, and sitting to prevent lower abdominal bulging and increased opening of the upper rib cage      Lumbar Exercises: Seated   Other Seated Lumbar Exercises seated lumbar sidebending with breath to open up the rib cage on both sides      Manual Therapy   Manual Therapy Soft tissue mobilization;Myofascial release    Manual therapy comments educated patient on circular massage    Soft tissue mobilization circular massage to the abdomen to improve peristalic motion; supine and sidely massage to the diaphragm    Myofascial Release tissue rolling of the upper abdomen, release of the middle abdomen, along the right upper and left upper quadrant                     PT Education - 03/18/21 1527     Education Details Access Code: [email or web address]    Person(s) Educated  Patient    Methods Explanation;Demonstration;Verbal cues;Handout    Comprehension Returned demonstration;Verbalized understanding              PT Short Term Goals - 03/18/21 1536       PT SHORT TERM GOAL #1   Title Patient will demonstrate independent use of home exercise program including pelvic floor exercises    Time 4    Period Weeks    Status Achieved    Target Date 03/16/21               PT Long Term Goals - 02/16/21 1621       PT LONG TERM GOAL #1   Title Patient will demonstrate advanced HEP for pelvic floor coordination and strength    Baseline --    Time 8    Period Weeks    Status New    Target Date 04/13/21      PT LONG TERM GOAL #2   Title Patient will be able to have a bowel movement without straining or pushing by the tailbone to fully empty her stool    Baseline ----    Time 8    Period Weeks    Status New    Target Date 04/13/21      PT LONG TERM GOAL #3   Title Patient will report her stool leakage has improved >/= 75% due to improved pelvic floor coordination    Baseline ---    Time 8    Period Weeks    Status New    Target Date 04/13/21      PT LONG TERM GOAL #4   Title Patient will be able to demonstrate correct  toileting using the squatty potty to push stool out and not having to assist    Baseline ----    Time 8    Period Weeks    Status New    Target Date 04/13/21      PT LONG TERM GOAL #5   Title ----    Baseline ----                   Plan - 03/18/21 1527     Clinical Impression Statement Patient has increased restrictions in the upper abdominals and has trouble expanding the upper abdominals. She will bulge the lower abdominals more. Patient needs more tone in the lower abdomen to assist in pushing the stool out. She is not  moving her diaphragm well so her pelvic floor is not moving well and not able to bulge well. Patient needs alot of tactile and verbal cues to perform diaphragmatic breathing. Patient still  feels the stool is on the left. She is working on increasing her fiber to make the stool firmer. Patient will benefit from skilled therapy to improve stool emptying and reduce fecal leakage.    Personal Factors and Comorbidities Comorbidity 3+;Time since onset of injury/illness/exacerbation;Past/Current Experience;Fitness    Comorbidities Stroke 12/2019; End stage renal disease; Raynauds disease; Sclerodema; RA; Pelvic laproscopy for myomectomy 2011; Dialyssis; C-section    Examination-Activity Limitations Continence;Toileting    Stability/Clinical Decision Making Stable/Uncomplicated    Rehab Potential Good    PT Frequency 1x / week    PT Duration 8 weeks    PT Treatment/Interventions ADLs/Self Care Home Management;Biofeedback;Therapeutic activities;Therapeutic exercise;Neuromuscular re-education;Patient/family education;Manual techniques;Spinal Manipulations    PT Next Visit Plan work on coccyx, pushing finger out of anus;work on lower abdominals contracting, work on increasing the rib cage opening.    PT Home Exercise Plan Access Code: [email or web address]    [email or web address] and Agree with Plan of Care Patient             Patient will benefit from skilled therapeutic intervention in order to improve the following deficits and impairments:  Decreased coordination, Increased fascial restricitons, Decreased activity tolerance, Decreased strength  Visit Diagnosis: Muscle weakness (generalized)  Other lack of coordination     Problem List Patient Active Problem List   Diagnosis Date Noted   Neck pain 10/30/2020   Knee locking, left 10/05/2020   Abnormality of gait 02/25/2020   Protein-calorie malnutrition, severe 01/19/2020   Epistaxis    Sleep disturbance    Slow transit constipation    Hemorrhoids    Chronic systolic congestive heart failure ([place])    ESRD on dialysis ([place])    Right hemiparesis ([place])    Pressure injury of skin 12/20/2019   Intraparenchymal hemorrhage of brain ([place])  12/18/2019   ICH (intracerebral hemorrhage) ([place]) 12/12/2019   Viral disease 08/22/2019   Chronic right-sided heart failure ([place]) 07/09/2019   Supraventricular tachycardia ([place]) 07/09/2019   Other cirrhosis of liver ([place]) 06/27/2019   Heart failure ([place]) 02/21/2019   Heart palpitations 08/14/2018   Other fluid overload 07/27/2018   Encounter for long-term (current) use of other medications 01/18/2018   Hypothyroidism 01/18/2018   Myalgia and myositis 01/18/2018   Chronic nephritis 01/18/2018   Other long term (current) drug therapy 01/18/2018   Sleep apnea 01/18/2018   Unspecified persistent mental disorders due to conditions classified elsewhere 01/18/2018   Venous reflux 01/18/2018   Vitamin B12 deficiency 01/18/2018   Vitamin D deficiency 01/18/2018   Obstructive lung disease ([place]) 12/30/2017   Other pruritus 12/27/2017   Eruption cyst 12/19/2017   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 ([place]) 11/28/2017   Chronic cough 11/11/2017   Pulmonary hypertension ([place]) 11/11/2017   Pulmonary arterial hypertension ([place]) 10/07/2017   Acute ITP ([place]) 01/05/2017   ESRD (end stage renal disease) ([place]) 12/28/2016   Encounter for removal of sutures 09/09/2016   Coagulation defect, unspecified ([place]) 09/01/2016   Underimmunization status 08/05/2016   Hemolytic anemia ([place]) 07/26/2016   Epistaxis, recurrent 07/26/2016   Unspecified protein-calorie malnutrition ([place]) 07/13/2016   Aftercare including intermittent dialysis ([place]) 07/07/2016   Anemia in chronic kidney disease 07/07/2016   Hypokalemia 07/07/2016   Iron deficiency anemia, unspecified 07/07/2016   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   Nonrheumatic mitral (valve) prolapse  07/07/2016   Other irritable bowel syndrome 07/07/2016   Other secondary thrombocytopenia 07/07/2016   Secondary hyperparathyroidism of renal origin ([place]) 07/07/2016   Thrombocytopenia ([place]) 07/01/2016   ARF (acute renal failure) ([place])  06/25/2016   Anxiety 06/25/2016   Bilateral carpal tunnel syndrome 10/22/2015   Chronic gout without tophus 10/22/2015   Chronic nonintractable headache 10/08/2015   Fibroid uterus 01/03/2012   H/O vitamin D deficiency 01/03/2012   Post-menopausal 01/03/2012   Hereditary and idiopathic peripheral neuropathy 11/19/2011   Intestinal malabsorption 08/01/8286   Lichen planus 33/74/4514   Low back pain 11/19/2011   Diffuse spasm of esophagus 11/11/2011   ESR raised 11/11/2011   Postinflammatory pulmonary fibrosis ([place]) 11/11/2011   Scleroderma ([place]) 11/16/2010   Rheumatoid arthritis ([place]) 11/16/2010   Raynaud's disease 11/16/2010   Symptomatic menopausal or female climacteric states 11/16/2010    Earlie Counts, PT 03/18/21 3:37 PM  [facility] @ [place], [place], [address] Phone: [phone]   Fax:  [phone]  Name: EMORY LEAVER MRN: [id number] Date of Birth: 1955-07-10

## 2021-03-19 ENCOUNTER — Ambulatory Visit: Payer: Medicare Other | Admitting: Sports Medicine

## 2021-03-19 DIAGNOSIS — N186 End stage renal disease: Secondary | ICD-10-CM | POA: Diagnosis not present

## 2021-03-19 DIAGNOSIS — D631 Anemia in chronic kidney disease: Secondary | ICD-10-CM | POA: Diagnosis not present

## 2021-03-19 DIAGNOSIS — R0609 Other forms of dyspnea: Secondary | ICD-10-CM | POA: Diagnosis not present

## 2021-03-19 DIAGNOSIS — Z992 Dependence on renal dialysis: Secondary | ICD-10-CM | POA: Diagnosis not present

## 2021-03-19 DIAGNOSIS — D509 Iron deficiency anemia, unspecified: Secondary | ICD-10-CM | POA: Diagnosis not present

## 2021-03-19 DIAGNOSIS — I270 Primary pulmonary hypertension: Secondary | ICD-10-CM | POA: Diagnosis not present

## 2021-03-19 DIAGNOSIS — N2581 Secondary hyperparathyroidism of renal origin: Secondary | ICD-10-CM | POA: Diagnosis not present

## 2021-03-21 DIAGNOSIS — D509 Iron deficiency anemia, unspecified: Secondary | ICD-10-CM | POA: Diagnosis not present

## 2021-03-21 DIAGNOSIS — N186 End stage renal disease: Secondary | ICD-10-CM | POA: Diagnosis not present

## 2021-03-21 DIAGNOSIS — D631 Anemia in chronic kidney disease: Secondary | ICD-10-CM | POA: Diagnosis not present

## 2021-03-21 DIAGNOSIS — N2581 Secondary hyperparathyroidism of renal origin: Secondary | ICD-10-CM | POA: Diagnosis not present

## 2021-03-21 DIAGNOSIS — Z992 Dependence on renal dialysis: Secondary | ICD-10-CM | POA: Diagnosis not present

## 2021-03-23 ENCOUNTER — Other Ambulatory Visit: Payer: Self-pay

## 2021-03-23 ENCOUNTER — Encounter: Payer: Self-pay | Admitting: Podiatrist

## 2021-03-23 ENCOUNTER — Ambulatory Visit: Payer: Medicare Other | Admitting: Podiatrist

## 2021-03-23 DIAGNOSIS — L84 Corns and callosities: Secondary | ICD-10-CM | POA: Diagnosis not present

## 2021-03-23 DIAGNOSIS — M21619 Bunion of unspecified foot: Secondary | ICD-10-CM

## 2021-03-23 DIAGNOSIS — M79675 Pain in left toe(s): Secondary | ICD-10-CM | POA: Diagnosis not present

## 2021-03-23 DIAGNOSIS — M79674 Pain in right toe(s): Secondary | ICD-10-CM | POA: Diagnosis not present

## 2021-03-23 NOTE — Progress Notes (Signed)
Chief Complaint  ?Patient presents with  ? Callouses  ?   ?bil great toe pain/ right foot heel pain  ?  ? ?HPI: Patient is 65 y.o. female who presents today for foot pain in bilateral feet. She has a tightness in the skin around her toes and relates that when she contracts her toes.  She has painful calluses on the left bunion prominence and hallux as well as painful calluses on both heels. Her bilateral great toenails also have calluses that are painful.  She does have scleroderma and is wondering if the skin is starting to tighten around her feet  ? ? ?Allergies  ?Allergen Reactions  ? Other Anaphylaxis and Other (See Comments)  ?  Do not use polyflux membrane.  Use alternate ?Other reaction(s): heart racing  ? [place] [Milnacipran Hcl] Palpitations and Other (See Comments)  ?  Unknown  ? Tape Rash and Other (See Comments)  ?  Itch- unsure if it was paper or adhesive tape  ? ? ?Review of systems is negative except as noted in the HPI.  Denies nausea/ vomiting/ fevers/ chills or night sweats.   Denies difficulty breathing, denies calf pain or tenderness ? ?Physical Exam ? ?Patient is awake, alert, and oriented x 3.  In no acute distress.   ?  ?Vascular status is intact with palpable pedal pulses DP and PT bilateral and capillary refill time less than 3 seconds bilateral.  No edema or erythema noted along the hallux nails or in the feet or legs. ?  ?Neurological exam reveals epicritic and protective sensation grossly intact bilateral.  ?  ?Dermatological exam reveals skin is mildly xerotic to bilateral feet.  No open lesions present.  Callus tissue is present on the left and right hallux lateral border.  a large callus is present on the left hallux medial aspect as well as the left bunion prominence.  Bilateral heels are also callused.  No sign of open lesions or ulcerations noted.  ?  ?Musculoskeletal exam: Musculature intact with dorsiflexion, plantarflexion, inversion, eversion. First MPJ joint range of motion  mildly decreased.  Bunion deformity is present left. Pain with plantarflexion of toes reported.  ? ?Assessment: ? ? ?  ICD-10-CM   ?1. Bunion  M21.619   ?  ?2. Pain in toes of both feet  M79.674   ? K93.267   ?  ?3. Heel callus  L84   ?  ?4. Skin callus  L84   ?  ? ?Plan: ?Discussed exam findings with the patient.  Discussed that her skin is dry and does have areas of callus which are likely affecting the perception of tightness in her feet she is experiencing at today's visit I trimmed the calluses with a #15 blade without complication and I recommended Flexitol heel balm to be used on her heels and areas of thickened skin.  Also recommended a compounded cream for pain and neuropathy and will call this for him for her at [facility] she will try this and will call if pain continues.  She will be seen back as needed for follow-up. ? ?

## 2021-03-23 NOTE — Patient Instructions (Signed)
I have ordered a medication for you that will come from [facility] in [place]. They should be calling you to verify insurance and will mail the medication to you. If you live close by then you can go by their pharmacy to pick up the medication. Their phone number is [phone]. If you do not hear from them in the next few days, please give [place] a call at [phone].   ? ? ?Try Flexitol Heel Balm for your heels and calluses- it will soften the skin and calluses ? ?There are heel sleeves with silicone in them you can get off [employer] or in some drug stores-  they are slipper like socks with silicone on the heels. These are great to sleep in . ? ?If you decide you would like to try physical therapy, let me know and I can order it for you.  ?

## 2021-03-24 DIAGNOSIS — D631 Anemia in chronic kidney disease: Secondary | ICD-10-CM | POA: Diagnosis not present

## 2021-03-24 DIAGNOSIS — D509 Iron deficiency anemia, unspecified: Secondary | ICD-10-CM | POA: Diagnosis not present

## 2021-03-24 DIAGNOSIS — N186 End stage renal disease: Secondary | ICD-10-CM | POA: Diagnosis not present

## 2021-03-24 DIAGNOSIS — Z992 Dependence on renal dialysis: Secondary | ICD-10-CM | POA: Diagnosis not present

## 2021-03-24 DIAGNOSIS — N2581 Secondary hyperparathyroidism of renal origin: Secondary | ICD-10-CM | POA: Diagnosis not present

## 2021-03-25 ENCOUNTER — Other Ambulatory Visit: Payer: Self-pay

## 2021-03-25 ENCOUNTER — Ambulatory Visit: Payer: Medicare Other | Admitting: Physical Therapy

## 2021-03-25 ENCOUNTER — Encounter: Payer: Self-pay | Admitting: Physical Therapy

## 2021-03-25 DIAGNOSIS — M6281 Muscle weakness (generalized): Secondary | ICD-10-CM

## 2021-03-25 DIAGNOSIS — R278 Other lack of coordination: Secondary | ICD-10-CM | POA: Diagnosis not present

## 2021-03-25 NOTE — Patient Instructions (Addendum)
About Organ Prolapse and Splinting Technique ?Unfortunately, organs such as the bladder and uterus can fall down and protrude into the vagina or rectum, which affects bowel emptying.  Organ prolapse is most commonly caused by childbirth, chronic straining with defecation, and hormonal changes that cause the ligaments holding up the organs to sag.  ? ?Sometimes pushing harder to defecate actually causes the feces to bulge into the vagina and then it won't come out.  This feels like you are sitting on a golf ball or rocks, or that defecation was incomplete and that there is pressure in the anus. Prolapse symptoms may include constipation, difficulty evacuating with straining, and a bulge in the vagina when straining to have a bowel movement.  People experiencing organ prolapse will have a recurring urge to have a bowel movement after leaving the bathroom.   ? ?Splinting ? ?Some women may use splinting, or perineal pressure, to assist in emptying the rectum when organs have prolapsed.  Splinting is a term used to describe the process of applying pressure against the outside or inside of the perineum to hold organs in place and make it easier for urine or feces to pass.  Some women find that massaging the rectum helps push feces out.  In addition, there are two main splinting techniques: ? ?Put on a latex or vinyl glove and sit on the toilet.  Reach between your legs and apply pressure upward and inward to the perineal body (just forward of the anus).  This keeps feces from bulging into the vagina and directs them backward toward the anus. ? ?Another method is to place your gloved thumb into the vagina and push backward toward the rectum.  Then use the belly muscles and proper emptying techniques (see additional handout for more details) to empty the rectum of feces. ? ? ? ?Access Code: [email or web address] ?URL: [email or web address] ?Date: 03/25/2021 ?Prepared by: Earlie Counts ? ?Exercises ?Sidelying Pelvic Floor  Contraction with Self-Palpation - 2-4 x daily - 7 x weekly - 1 sets - 5 reps ?Hooklying Isometric Hip Flexion - 1 x daily - 7 x weekly - 2 sets - 10 reps - 5 sec hold ?Supine Diaphragmatic Breathing - 2 x daily - 7 x weekly - 1 sets - 10 reps ?Seated Diaphragmatic Breathing - 2 x daily - 7 x weekly - 1 sets - 10 reps ?Seated Sidebending - 1 x daily - 7 x weekly - 2 sets - 10 reps ?Seated Abdominal Press into [employer] - 2 x daily - 7 x weekly - 1 sets - 10 reps - 5 sec hold ?[place] [address] ?[place], [place] [address] ?Phone # [phone] ?Fax [phone] ? ?

## 2021-03-25 NOTE — Therapy (Signed)
[place] ?[facility] [address] ?[place], [place], [address] ?Phone: [phone]   Fax:  [phone] ? ?Physical Therapy Treatment ? ?Patient Details  ?Name: Katie Nunez ?MRN: [id number] ?Date of Birth: [birth year] ?Referring Provider (PT): Dr. [name] ? ? ?Encounter Date: 03/25/2021 ? ? PT End of Session - 03/25/21 1541   ? ? Visit Number 5   ? Date for PT Re-Evaluation 04/13/21   ? Authorization Type UHC medicare   ? Authorization - Visit Number 5   ? Authorization - Number of Visits 10   ? PT Start Time 2694   came late  ? PT Stop Time 8546   ? PT Time Calculation (min) 28 min   ? Activity Tolerance Patient tolerated treatment well   ? Behavior During Therapy [facility] for tasks assessed/performed   ? ?  ?  ? ?  ? ? ?Past Medical History:  ?Diagnosis Date  ? Achalasia   ? Anxiety   ? Dysplasia of cervix, low grade (CIN 1)   ? Environmental allergies   ? "all year long" (12/27/2016)  ? ESRD (end stage renal disease) on dialysis [facility])   ? "TTS; [place]" (12/27/2016)  ? Fibromyalgia   ? GERD (gastroesophageal reflux disease)   ? Gout   ? Hypertension   ? IBS (irritable bowel syndrome)   ? MVP (mitral valve prolapse)   ? RA (rheumatoid arthritis) ([place])   ? FOLLOWED BY DR. SHANAHAN  ? Raynaud's disease   ? Scleroderma ([place])   ? Seasonal allergies   ? Thrombocytopenia ([place]) 07/01/2016  ? Acute fall to 13,000 07/01/16  ? Tubular adenoma 01/08/2008  ? CECUM  ? Vitamin D deficiency   ? ? ?Past Surgical History:  ?Procedure Laterality Date  ? ANKLE FRACTURE SURGERY Right   ? AV FISTULA PLACEMENT Left 06/28/2016  ? Procedure: left arm ARTERIOVENOUS (AV) FISTULA CREATION;  Surgeon: Rosetta Posner, MD;  Location: [place];  Service: Vascular;  Laterality: Left;  ? BASCILIC VEIN TRANSPOSITION Left 09/27/2016  ? Procedure: LEFT UPPER ARM CEPHALIC VEIN TRANSPOSITION;  Surgeon: Rosetta Posner, MD;  [facility];  Service: Vascular;  Laterality: Left;  ? BREAST BIOPSY    ? "? side"   ? [place]  ? CO2 LASER OF CERVIX    ? COLONOSCOPY W/ BIOPSIES  01/08/2008  ? INSERTION OF DIALYSIS CATHETER Right 06/28/2016  ? Procedure: INSERTION OF DIALYSIS CATHETER, right internal jugular;  Surgeon: Rosetta Posner, MD;  Location: [place];  Service: Vascular;  Laterality: Right;  ? MYOMECTOMY    ? NASAL ENDOSCOPY WITH EPISTAXIS CONTROL N/A 12/29/2019  ? Procedure: NASAL ENDOSCOPY WITH EPISTAXIS CONTROL;  Surgeon: Leta Baptist, MD;  Location: [place];  Service: ENT;  Laterality: N/A;  ? PELVIC LAPAROSCOPY  2011  ? superficial thrombophlebitis Left 07-2014  ? ? ?There were no vitals filed for this visit. ? ? Subjective Assessment - 03/25/21 1542   ? ? Subjective I am having more of a bowel movement but not able to push the bowel fully out. I am getting my stools a little firmer. I am still pushing on the right side of tailbone to get the stool out. Length of bowel movements are longer.   ? Patient Stated Goals learn how to expel the stool without putting had no tailbone to push the stool out   ? Currently in Pain? No/denies   ? Multiple Pain Sites No   ? ?  ?  ? ?  ? ? ? ? ? ? ? ? ? ? ? ? ? ? ? ? ? ? ? ?  [facility] Adult PT Treatment/Exercise - 03/25/21 0001   ? ?  ? Self-Care  ? Self-Care Other Self-Care Comments   ? Other Self-Care Comments  Instructed patient on using almond oil vaginally and around the anus to reduce dryness. Instructed patient on ways to splint to remove the stool out of the rectum when a little is left.   ?  ? Lumbar Exercises: Seated  ? Other Seated Lumbar Exercises seated press into ball to engage the lower abdomen holding for 5 seconds   ?  ? Lumbar Exercises: Supine  ? Ab Set 10 reps   ? AB Set Limitations but has trouble contracting the lower abdomen with hand press   ? ?  ?  ? ?  ? ? ? ? ? ? ? ? ? ? PT Education - 03/25/21 1612   ? ? Education Details Access Code: [email or web address]; how to splint to have a bowel movement   ? Person(s) Educated Patient   ? Methods  Explanation;Demonstration;Handout   ? Comprehension Returned demonstration;Verbalized understanding   ? ?  ?  ? ?  ? ? ? PT Short Term Goals - 03/18/21 1536   ? ?  ? PT SHORT TERM GOAL #1  ? Title Patient will demonstrate independent use of home exercise program including pelvic floor exercises   ? Time 4   ? Period Weeks   ? Status Achieved   ? Target Date 03/16/21   ? ?  ?  ? ?  ? ? ? ? PT Long Term Goals - 03/25/21 1616   ? ?  ? PT LONG TERM GOAL #1  ? Title Patient will demonstrate advanced HEP for pelvic floor coordination and strength   ? Time 8   ? Period Weeks   ? Status On-going   ?  ? PT LONG TERM GOAL #2  ? Title Patient will be able to have a bowel movement without straining or pushing by the tailbone to fully empty her stool   ? Time 8   ? Period Weeks   ? Status On-going   ?  ? PT LONG TERM GOAL #3  ? Title Patient will report her stool leakage has improved >/= 75% due to improved pelvic floor coordination   ? Time 8   ? Period Weeks   ? Status On-going   ?  ? PT LONG TERM GOAL #4  ? Title Patient will be able to demonstrate correct  toileting using the squatty potty to push stool out and not having to assist   ? Time 8   ? Period Weeks   ? Status On-going   ? ?  ?  ? ?  ? ? ? ? ? ? ? ? Plan - 03/25/21 1613   ? ? Clinical Impression Statement Patient bowel movements are longer in length. She is having more bowel sounds. Patient continues to have difficulty with contracting her lower abdominals and over contracting her upper abdominals. Patient reports her stool are a little firmer. Patient was educated to use almond oil around the anus and into the anus to coat the area due to dryness. Patient will benefit from skilled therapy to improve stool emptying and reduce fecal leakage.   ? Personal Factors and Comorbidities Comorbidity 3+;Time since onset of injury/illness/exacerbation;Past/Current Experience;Fitness   ? Comorbidities Stroke 12/2019; End stage renal disease; Raynauds disease; Sclerodema; RA;  Pelvic laproscopy for myomectomy 2011; Dialyssis; C-section   ? Examination-Activity Limitations Continence;Toileting   ? Stability/Clinical Decision Making Stable/Uncomplicated   ?  Rehab Potential Good   ? PT Frequency 1x / week   ? PT Duration 8 weeks   ? PT Treatment/Interventions ADLs/Self Care Home Management;Biofeedback;Therapeutic activities;Therapeutic exercise;Neuromuscular re-education;Patient/family education;Manual techniques;Spinal Manipulations   ? PT Next Visit Plan manual work to the left side of the abdomen, work on abdominal contraction, see if the almond oil is helping, add trunk rotation; ask about stool leakage and if she does add the anal wick   ? PT Home Exercise Plan Access Code: [email or web address]   ? Consulted and Agree with Plan of Care Patient   ? ?  ?  ? ?  ? ? ?Patient will benefit from skilled therapeutic intervention in order to improve the following deficits and impairments:  Decreased coordination, Increased fascial restricitons, Decreased activity tolerance, Decreased strength ? ?Visit Diagnosis: ?Muscle weakness (generalized) ? ?Other lack of coordination ? ? ? ? ?Problem List ?Patient Active Problem List  ? Diagnosis Date Noted  ? Neck pain 10/30/2020  ? Knee locking, left 10/05/2020  ? Abnormality of gait 02/25/2020  ? Protein-calorie malnutrition, severe 01/19/2020  ? Epistaxis   ? Sleep disturbance   ? Slow transit constipation   ? Hemorrhoids   ? Chronic systolic congestive heart failure ([place])   ? ESRD on dialysis [facility])   ? Right hemiparesis ([place])   ? Pressure injury of skin 12/20/2019  ? Intraparenchymal hemorrhage of brain ([place]) 12/18/2019  ? ICH (intracerebral hemorrhage) ([place]) 12/12/2019  ? Viral disease 08/22/2019  ? Chronic right-sided heart failure ([place]) 07/09/2019  ? Supraventricular tachycardia ([place]) 07/09/2019  ? Other cirrhosis of liver ([place]) 06/27/2019  ? Heart failure ([place]) 02/21/2019  ? Heart palpitations 08/14/2018  ? Other fluid overload 07/27/2018  ? Encounter for  long-term (current) use of other medications 01/18/2018  ? Hypothyroidism 01/18/2018  ? Myalgia and myositis 01/18/2018  ? Chronic nephritis 01/18/2018  ? Other long term (current) drug therapy 01/18/2018  ? Sleep apnea 01/18/2018  ?

## 2021-03-26 DIAGNOSIS — Z992 Dependence on renal dialysis: Secondary | ICD-10-CM | POA: Diagnosis not present

## 2021-03-26 DIAGNOSIS — D509 Iron deficiency anemia, unspecified: Secondary | ICD-10-CM | POA: Diagnosis not present

## 2021-03-26 DIAGNOSIS — N2581 Secondary hyperparathyroidism of renal origin: Secondary | ICD-10-CM | POA: Diagnosis not present

## 2021-03-26 DIAGNOSIS — N186 End stage renal disease: Secondary | ICD-10-CM | POA: Diagnosis not present

## 2021-03-26 DIAGNOSIS — D631 Anemia in chronic kidney disease: Secondary | ICD-10-CM | POA: Diagnosis not present

## 2021-03-28 DIAGNOSIS — N186 End stage renal disease: Secondary | ICD-10-CM | POA: Diagnosis not present

## 2021-03-28 DIAGNOSIS — D509 Iron deficiency anemia, unspecified: Secondary | ICD-10-CM | POA: Diagnosis not present

## 2021-03-28 DIAGNOSIS — D631 Anemia in chronic kidney disease: Secondary | ICD-10-CM | POA: Diagnosis not present

## 2021-03-28 DIAGNOSIS — N2581 Secondary hyperparathyroidism of renal origin: Secondary | ICD-10-CM | POA: Diagnosis not present

## 2021-03-28 DIAGNOSIS — Z992 Dependence on renal dialysis: Secondary | ICD-10-CM | POA: Diagnosis not present

## 2021-03-31 DIAGNOSIS — Z992 Dependence on renal dialysis: Secondary | ICD-10-CM | POA: Diagnosis not present

## 2021-03-31 DIAGNOSIS — N2581 Secondary hyperparathyroidism of renal origin: Secondary | ICD-10-CM | POA: Diagnosis not present

## 2021-03-31 DIAGNOSIS — D509 Iron deficiency anemia, unspecified: Secondary | ICD-10-CM | POA: Diagnosis not present

## 2021-03-31 DIAGNOSIS — N186 End stage renal disease: Secondary | ICD-10-CM | POA: Diagnosis not present

## 2021-03-31 DIAGNOSIS — D631 Anemia in chronic kidney disease: Secondary | ICD-10-CM | POA: Diagnosis not present

## 2021-04-01 ENCOUNTER — Encounter: Payer: Self-pay | Admitting: Physical Therapy

## 2021-04-01 ENCOUNTER — Other Ambulatory Visit: Payer: Self-pay

## 2021-04-01 ENCOUNTER — Ambulatory Visit: Payer: Medicare Other | Admitting: Physical Therapy

## 2021-04-01 DIAGNOSIS — M6281 Muscle weakness (generalized): Secondary | ICD-10-CM | POA: Diagnosis not present

## 2021-04-01 DIAGNOSIS — R278 Other lack of coordination: Secondary | ICD-10-CM | POA: Diagnosis not present

## 2021-04-01 NOTE — Addendum Note (Signed)
Addended by: Earlie Counts F on: 04/01/2021 03:42 PM ? ? Modules accepted: Orders ? ?

## 2021-04-01 NOTE — Therapy (Signed)
[employer] ?[place] [facility] [place] [place] ?[place], [place], [address] ?Phone: [phone]   Fax:  [phone] ? ?Physical Therapy Treatment ? ?Patient Details  ?Name: Katie Nunez ?MRN: [id number] ?Date of Birth: 06-Dec-1955 ?Referring Provider (PT): Dr. [name] ? ? ?Encounter Date: 04/01/2021 ? ? PT End of Session - 04/01/21 1448   ? ? Visit Number 6   ? Date for PT Re-Evaluation 04/13/21   ? Authorization Type UHC medicare   ? Authorization - Visit Number 6   ? Authorization - Number of Visits 10   ? PT Start Time 7169   ? PT Stop Time 1525   ? PT Time Calculation (min) 40 min   ? Activity Tolerance Patient tolerated treatment well   ? Behavior During Therapy [facility] for tasks assessed/performed   ? ?  ?  ? ?  ? ? ?Past Medical History:  ?Diagnosis Date  ? Achalasia   ? Anxiety   ? Dysplasia of cervix, low grade (CIN 1)   ? Environmental allergies   ? "all year long" (12/27/2016)  ? ESRD (end stage renal disease) on dialysis [facility])   ? "TTS; [place]" (12/27/2016)  ? Fibromyalgia   ? GERD (gastroesophageal reflux disease)   ? Gout   ? Hypertension   ? IBS (irritable bowel syndrome)   ? MVP (mitral valve prolapse)   ? RA (rheumatoid arthritis) ([place])   ? FOLLOWED BY DR. SHANAHAN  ? Raynaud's disease   ? Scleroderma ([place])   ? Seasonal allergies   ? Thrombocytopenia ([place]) 07/01/2016  ? Acute fall to 13,000 07/01/16  ? Tubular adenoma 01/08/2008  ? CECUM  ? Vitamin D deficiency   ? ? ?Past Surgical History:  ?Procedure Laterality Date  ? ANKLE FRACTURE SURGERY Right   ? AV FISTULA PLACEMENT Left 06/28/2016  ? Procedure: left arm ARTERIOVENOUS (AV) FISTULA CREATION;  Surgeon: Rosetta Posner, MD;  Location: [place];  Service: Vascular;  Laterality: Left;  ? BASCILIC VEIN TRANSPOSITION Left 09/27/2016  ? Procedure: LEFT UPPER ARM CEPHALIC VEIN TRANSPOSITION;  Surgeon: Rosetta Posner, MD;  [facility];  Service: Vascular;  Laterality: Left;  ? BREAST BIOPSY    ? "? side"  ?  [place]  ? CO2 LASER OF CERVIX    ? COLONOSCOPY W/ BIOPSIES  01/08/2008  ? INSERTION OF DIALYSIS CATHETER Right 06/28/2016  ? Procedure: INSERTION OF DIALYSIS CATHETER, right internal jugular;  Surgeon: Rosetta Posner, MD;  Location: [place];  Service: Vascular;  Laterality: Right;  ? MYOMECTOMY    ? NASAL ENDOSCOPY WITH EPISTAXIS CONTROL N/A 12/29/2019  ? Procedure: NASAL ENDOSCOPY WITH EPISTAXIS CONTROL;  Surgeon: Leta Baptist, MD;  Location: [place];  Service: ENT;  Laterality: N/A;  ? PELVIC LAPAROSCOPY  2011  ? superficial thrombophlebitis Left 07-2014  ? ? ?There were no vitals filed for this visit. ? ? Subjective Assessment - 04/01/21 1449   ? ? Subjective I still have discomfort on the right side where the bowel is not moving. I have a good bowel movment but cuts off too quick and not all comes out. I am getting more gas now. [place] oil is helping with the intial evacuation.   ? Patient Stated Goals learn how to expel the stool without putting had no tailbone to push the stool out   ? Currently in Pain? No/denies   ? Multiple Pain Sites No   ? ?  ?  ? ?  ? ? ? ? ?  [facility] PT Assessment - 04/01/21 0001   ? ?  ? Assessment  ? Medical Diagnosis N81.89 Pelvic floor weakness   ? Referring Provider (PT) Dr. [name]   ? Onset Date/Surgical Date --   01/12/2020  ? Prior Therapy not for pelvic floor   ?  ? Precautions  ? Precautions None   ?  ? Restrictions  ? Weight Bearing Restrictions No   ? ?  ?  ? ?  ? ? ? ? ? ? ? ? ? ? ? ? ? ? ? ? [place] Adult PT Treatment/Exercise - 04/01/21 0001   ? ?  ? Self-Care  ? Self-Care Other Self-Care Comments   ? Other Self-Care Comments  discussed with patient on what the feeling is on the bottom could be her sacrum and to monitor the feeling.   ?  ? Lumbar Exercises: Aerobic  ? Nustep level 5 for 7 minutes while assess patient   ?  ? Lumbar Exercises: Seated  ? Sit to Stand 10 reps   ?  ? Lumbar Exercises: Supine  ? Bridge 15 reps;1 second   ? Bridge Limitations tactile cues  to lift her buttocks further   ? Other Supine Lumbar Exercises hip abduction 15 times with pelvic floor contraction right, left;   ?  ? Lumbar Exercises: Sidelying  ? Hip Abduction Right;Left;10 reps;1 second   ? Hip Abduction Weights (lbs) needed assistance on the right   ?  ? Lumbar Exercises: Prone  ? Straight Leg Raise 10 reps   left side  ? Straight Leg Raises Limitations on the right needed to do TKE 10x with pelvic floor contraction   ? ?  ?  ? ?  ? ? ? ? ? ? ? ? ? ? ? ? PT Short Term Goals - 03/18/21 1536   ? ?  ? PT SHORT TERM GOAL #1  ? Title Patient will demonstrate independent use of home exercise program including pelvic floor exercises   ? Time 4   ? Period Weeks   ? Status Achieved   ? Target Date 03/16/21   ? ?  ?  ? ?  ? ? ? ? PT Long Term Goals - 04/01/21 1532   ? ?  ? PT LONG TERM GOAL #1  ? Title Patient will demonstrate advanced HEP for pelvic floor coordination and strength   ? Time 8   ? Period Weeks   ? Status On-going   ? Target Date 04/13/21   ?  ? PT LONG TERM GOAL #2  ? Title Patient will be able to have a bowel movement without straining or pushing by the tailbone to fully empty her stool   ? Time 8   ? Period Weeks   ? Status On-going   ? Target Date 04/13/21   ?  ? PT LONG TERM GOAL #3  ? Title Patient will report her stool leakage has improved >/= 75% due to improved pelvic floor coordination   ? Time 8   ? Period Weeks   ? Status On-going   ? Target Date 04/13/21   ?  ? PT LONG TERM GOAL #4  ? Title Patient will be able to demonstrate correct  toileting using the squatty potty to push stool out and not having to assist   ? Time 8   ? Period Weeks   ? Status On-going   ? ?  ?  ? ?  ? ? ? ? ? ? ? ?  Plan - 04/01/21 1450   ? ? Clinical Impression Statement Patient reports no significant difference since last treatment. She still feels the stool at the right side of her bottom. This could be the sacrum and due to decreased muscle tone and reduction of weight she is feeling her bones more.  Her right leg is weaker than her left. She is not able to lift her right leg off the mat independently and needs to do a TKE in prone. She feels she is getting more stool left but some is left. Sometimes she goes to the bathroom 2 times per day and therapist educated patient that this could be normal. she still feels stool stuck on the right side of her abdomen. Patient will benefit from skilled therapy to improve stool emptying and reduce fecal leakage.   ? Personal Factors and Comorbidities Comorbidity 3+;Time since onset of injury/illness/exacerbation;Past/Current Experience;Fitness   ? Comorbidities Stroke 12/2019; End stage renal disease; Raynauds disease; Sclerodema; RA; Pelvic laproscopy for myomectomy 2011; Dialyssis; C-section   ? Examination-Activity Limitations Continence;Toileting   ? Stability/Clinical Decision Making Stable/Uncomplicated   ? Rehab Potential Good   ? PT Frequency 1x / week   ? PT Duration 8 weeks   ? PT Treatment/Interventions ADLs/Self Care Home Management;Biofeedback;Therapeutic activities;Therapeutic exercise;Neuromuscular re-education;Patient/family education;Manual techniques;Spinal Manipulations   ? PT Next Visit Plan work on hip strength and pelvic strength, write renewal if she is making progress.   ? PT Home Exercise Plan Access Code: [email or web address]   ? Consulted and Agree with Plan of Care Patient   ? ?  ?  ? ?  ? ? ?Patient will benefit from skilled therapeutic intervention in order to improve the following deficits and impairments:  Decreased range of motion ? ?Visit Diagnosis: ?Muscle weakness (generalized) ? ?Other lack of coordination ? ? ? ? ?Problem List ?Patient Active Problem List  ? Diagnosis Date Noted  ? Neck pain 10/30/2020  ? Knee locking, left 10/05/2020  ? Abnormality of gait 02/25/2020  ? Protein-calorie malnutrition, severe 01/19/2020  ? Epistaxis   ? Sleep disturbance   ? Slow transit constipation   ? Hemorrhoids   ? Chronic systolic congestive heart failure ([place])    ? ESRD on dialysis [facility])   ? Right hemiparesis ([place])   ? Pressure injury of skin 12/20/2019  ? Intraparenchymal hemorrhage of brain ([place]) 12/18/2019  ? ICH (intracerebral hemorrhage) ([place]) 12/12/2019  ? V

## 2021-04-02 DIAGNOSIS — N186 End stage renal disease: Secondary | ICD-10-CM | POA: Diagnosis not present

## 2021-04-02 DIAGNOSIS — Z992 Dependence on renal dialysis: Secondary | ICD-10-CM | POA: Diagnosis not present

## 2021-04-02 DIAGNOSIS — N2581 Secondary hyperparathyroidism of renal origin: Secondary | ICD-10-CM | POA: Diagnosis not present

## 2021-04-02 DIAGNOSIS — D509 Iron deficiency anemia, unspecified: Secondary | ICD-10-CM | POA: Diagnosis not present

## 2021-04-02 DIAGNOSIS — D631 Anemia in chronic kidney disease: Secondary | ICD-10-CM | POA: Diagnosis not present

## 2021-04-04 DIAGNOSIS — D631 Anemia in chronic kidney disease: Secondary | ICD-10-CM | POA: Diagnosis not present

## 2021-04-04 DIAGNOSIS — Z992 Dependence on renal dialysis: Secondary | ICD-10-CM | POA: Diagnosis not present

## 2021-04-04 DIAGNOSIS — N2581 Secondary hyperparathyroidism of renal origin: Secondary | ICD-10-CM | POA: Diagnosis not present

## 2021-04-04 DIAGNOSIS — N186 End stage renal disease: Secondary | ICD-10-CM | POA: Diagnosis not present

## 2021-04-04 DIAGNOSIS — D509 Iron deficiency anemia, unspecified: Secondary | ICD-10-CM | POA: Diagnosis not present

## 2021-04-07 DIAGNOSIS — N2581 Secondary hyperparathyroidism of renal origin: Secondary | ICD-10-CM | POA: Diagnosis not present

## 2021-04-07 DIAGNOSIS — N186 End stage renal disease: Secondary | ICD-10-CM | POA: Diagnosis not present

## 2021-04-07 DIAGNOSIS — D509 Iron deficiency anemia, unspecified: Secondary | ICD-10-CM | POA: Diagnosis not present

## 2021-04-07 DIAGNOSIS — D631 Anemia in chronic kidney disease: Secondary | ICD-10-CM | POA: Diagnosis not present

## 2021-04-07 DIAGNOSIS — Z992 Dependence on renal dialysis: Secondary | ICD-10-CM | POA: Diagnosis not present

## 2021-04-08 ENCOUNTER — Encounter: Payer: Self-pay | Admitting: Physical Therapy

## 2021-04-08 ENCOUNTER — Ambulatory Visit: Payer: Medicare Other | Admitting: Physical Therapy

## 2021-04-08 DIAGNOSIS — R278 Other lack of coordination: Secondary | ICD-10-CM | POA: Diagnosis not present

## 2021-04-08 DIAGNOSIS — M6281 Muscle weakness (generalized): Secondary | ICD-10-CM | POA: Diagnosis not present

## 2021-04-08 NOTE — Therapy (Signed)
[facility] ?[place], [place], [address] ?Phone: [phone]   Fax:  [phone] ? ?Physical Therapy Treatment ? ?Patient Details  ?Name: Katie Nunez ?MRN: [id number] ?Date of Birth: [birth year] ?Referring Provider (PT): Dr. [name] ? ? ?Encounter Date: 04/08/2021 ? ? PT End of Session - 04/08/21 1452   ? ? Visit Number 7   ? Date for PT Re-Evaluation 04/13/21   ? Authorization Type UHC medicare   ? Authorization - Visit Number 7   ? Authorization - Number of Visits 10   ? PT Start Time 9470   ? PT Stop Time 1525   ? PT Time Calculation (min) 40 min   ? Activity Tolerance Patient tolerated treatment well   ? Behavior During Therapy [facility] for tasks assessed/performed   ? ?  ?  ? ?  ? ? ?Past Medical History:  ?Diagnosis Date  ? Achalasia   ? Anxiety   ? Dysplasia of cervix, low grade (CIN 1)   ? Environmental allergies   ? "all year long" (12/27/2016)  ? ESRD (end stage renal disease) on dialysis [facility])   ? "TTS; [place]" (12/27/2016)  ? Fibromyalgia   ? GERD (gastroesophageal reflux disease)   ? Gout   ? Hypertension   ? IBS (irritable bowel syndrome)   ? MVP (mitral valve prolapse)   ? RA (rheumatoid arthritis) ([place])   ? FOLLOWED BY DR. SHANAHAN  ? Raynaud's disease   ? Scleroderma ([place])   ? Seasonal allergies   ? Thrombocytopenia ([place]) 07/01/2016  ? Acute fall to 13,000 07/01/16  ? Tubular adenoma 01/08/2008  ? CECUM  ? Vitamin D deficiency   ? ? ?Past Surgical History:  ?Procedure Laterality Date  ? ANKLE FRACTURE SURGERY Right   ? AV FISTULA PLACEMENT Left 06/28/2016  ? Procedure: left arm ARTERIOVENOUS (AV) FISTULA CREATION;  Surgeon: Rosetta Posner, MD;  Location: [place];  Service: Vascular;  Laterality: Left;  ? BASCILIC VEIN TRANSPOSITION Left 09/27/2016  ? Procedure: LEFT UPPER ARM CEPHALIC VEIN TRANSPOSITION;  Surgeon: Rosetta Posner, MD;  [facility];  Service: Vascular;  Laterality: Left;  ? BREAST BIOPSY    ? "? side"  ?  [place]  ? CO2 LASER OF CERVIX    ? COLONOSCOPY W/ BIOPSIES  01/08/2008  ? INSERTION OF DIALYSIS CATHETER Right 06/28/2016  ? Procedure: INSERTION OF DIALYSIS CATHETER, right internal jugular;  Surgeon: Rosetta Posner, MD;  Location: [place];  Service: Vascular;  Laterality: Right;  ? MYOMECTOMY    ? NASAL ENDOSCOPY WITH EPISTAXIS CONTROL N/A 12/29/2019  ? Procedure: NASAL ENDOSCOPY WITH EPISTAXIS CONTROL;  Surgeon: Leta Baptist, MD;  Location: [place];  Service: ENT;  Laterality: N/A;  ? PELVIC LAPAROSCOPY  2011  ? superficial thrombophlebitis Left 07-2014  ? ? ?There were no vitals filed for this visit. ? ? Subjective Assessment - 04/08/21 1452   ? ? Subjective I still have the problem wiht not enough muscle to get the last bit out of stool.   ? Patient Stated Goals learn how to expel the stool without putting had no tailbone to push the stool out   ? Currently in Pain? No/denies   ? Multiple Pain Sites No   ? ?  ?  ? ?  ? ? ? ? ? ? ? ? ? ? ? ? ? ? ? ? ? ? ? ? [place] Adult PT Treatment/Exercise -  04/08/21 0001   ? ?  ? Therapeutic Activites   ? Therapeutic Activities Other Therapeutic Activities   ? Other Therapeutic Activities reviewed toileting technique and needed verbal cues to not bend forward with trunk and expand abdomen   ?  ? Lumbar Exercises: Seated  ? Other Seated Lumbar Exercises seated press into ball to engage the lower abdomen holding for 5 seconds 5x each side; then do to the opposite knee to engage the obliques   ?  ? Manual Therapy  ? Manual Therapy Soft tissue mobilization;Myofascial release   ? Soft tissue mobilization circular massage to abdomen to promote peristaic motion and along the diaphragm   ? Myofascial Release fascial release of the upper abdomen, suprapubically lifting the intestines, along the mesenteric root   ? ?  ?  ? ?  ? ? ? ? ? ? ? ? ? ? PT Education - 04/08/21 1529   ? ? Education Details Access Code: [email or web address]; reviewed toileting   ? Person(s) Educated Patient   ? Methods  Explanation;Demonstration;Verbal cues;Handout   ? Comprehension Returned demonstration;Verbalized understanding   ? ?  ?  ? ?  ? ? ? PT Short Term Goals - 03/18/21 1536   ? ?  ? PT SHORT TERM GOAL #1  ? Title Patient will demonstrate independent use of home exercise program including pelvic floor exercises   ? Time 4   ? Period Weeks   ? Status Achieved   ? Target Date 03/16/21   ? ?  ?  ? ?  ? ? ? ? PT Long Term Goals - 04/01/21 1532   ? ?  ? PT LONG TERM GOAL #1  ? Title Patient will demonstrate advanced HEP for pelvic floor coordination and strength   ? Time 8   ? Period Weeks   ? Status On-going   ? Target Date 04/13/21   ?  ? PT LONG TERM GOAL #2  ? Title Patient will be able to have a bowel movement without straining or pushing by the tailbone to fully empty her stool   ? Time 8   ? Period Weeks   ? Status On-going   ? Target Date 04/13/21   ?  ? PT LONG TERM GOAL #3  ? Title Patient will report her stool leakage has improved >/= 75% due to improved pelvic floor coordination   ? Time 8   ? Period Weeks   ? Status On-going   ? Target Date 04/13/21   ?  ? PT LONG TERM GOAL #4  ? Title Patient will be able to demonstrate correct  toileting using the squatty potty to push stool out and not having to assist   ? Time 8   ? Period Weeks   ? Status On-going   ? ?  ?  ? ?  ? ? ? ? ? ? ? ? Plan - 04/08/21 1538   ? ? Clinical Impression Statement Patient is able to have a bowel movement but not able to get the last bit out. She is getting more stool out than she used to. Patient is not bulging her lower abdominals as much. She will flex her trunk when blowing out with a bowel movement and keesp her head down and needs to be corrected. Patient needs verbal cues to make her abdomen bulging. Patient will benefit from skilled therapy to improve stool emptying and reduce fecal leakage.   ? Personal Factors and Comorbidities Comorbidity 3+;Time since onset of  injury/illness/exacerbation;Past/Current Experience;Fitness   ?  Comorbidities Stroke 12/2019; End stage renal disease; Raynauds disease; Sclerodema; RA; Pelvic laproscopy for myomectomy 2011; Dialyssis; C-section   ? Examination-Activity Limitations Continence;Toileting   ? Stability/Clinical Decision Making Stable/Uncomplicated   ? Rehab Potential Good   ? PT Frequency 1x / week   ? PT Duration 8 weeks   ? PT Treatment/Interventions ADLs/Self Care Home Management;Biofeedback;Therapeutic activities;Therapeutic exercise;Neuromuscular re-education;Patient/family education;Manual techniques;Spinal Manipulations   ? PT Next Visit Plan work on hip strength and pelvic strength, write renewal if she is making progress.   ? PT Home Exercise Plan Access Code: [email or web address]   ? Recommended Other Services MD signed renewal and second request sent for MD to sign the initial   ? Consulted and Agree with Plan of Care Patient   ? ?  ?  ? ?  ? ? ?Patient will benefit from skilled therapeutic intervention in order to improve the following deficits and impairments:  Decreased range of motion ? ?Visit Diagnosis: ?Muscle weakness (generalized) ? ?Other lack of coordination ? ? ? ? ?Problem List ?Patient Active Problem List  ? Diagnosis Date Noted  ? Neck pain 10/30/2020  ? Knee locking, left 10/05/2020  ? Abnormality of gait 02/25/2020  ? Protein-calorie malnutrition, severe 01/19/2020  ? Epistaxis   ? Sleep disturbance   ? Slow transit constipation   ? Hemorrhoids   ? Chronic systolic congestive heart failure ([place])   ? ESRD on dialysis [facility])   ? Right hemiparesis ([place])   ? Pressure injury of skin 12/20/2019  ? Intraparenchymal hemorrhage of brain ([place]) 12/18/2019  ? ICH (intracerebral hemorrhage) ([place]) 12/12/2019  ? Viral disease 08/22/2019  ? Chronic right-sided heart failure ([place]) 07/09/2019  ? Supraventricular tachycardia ([place]) 07/09/2019  ? Other cirrhosis of liver ([place]) 06/27/2019  ? Heart failure ([place]) 02/21/2019  ? Heart palpitations 08/14/2018  ? Other fluid overload 07/27/2018  ? Encounter for  long-term (current) use of other medications 01/18/2018  ? Hypothyroidism 01/18/2018  ? Myalgia and myositis 01/18/2018  ? Chronic nephritis 01/18/2018  ? Other long term (current) drug therapy 01/18/2018  ? [place]

## 2021-04-08 NOTE — Patient Instructions (Signed)
Access Code: [email or web address] ?URL: [email or web address] ?Date: 04/08/2021 ?Prepared by: Earlie Counts ? ?Exercises ?- Sidelying Pelvic Floor Contraction with Self-Palpation  - 2-4 x daily - 7 x weekly - 1 sets - 5 reps ?- Hooklying Isometric Hip Flexion  - 1 x daily - 7 x weekly - 2 sets - 10 reps - 5 sec hold ?- Supine Diaphragmatic Breathing  - 2 x daily - 7 x weekly - 1 sets - 10 reps ?- Seated Diaphragmatic Breathing  - 2 x daily - 7 x weekly - 1 sets - 10 reps ?- Seated Sidebending  - 1 x daily - 7 x weekly - 2 sets - 10 reps ?- Hooklying Isometric Hip Flexion  - 1 x daily - 3 x weekly - 2 sets - 5 reps - 5 sec hold ?- Hooklying Isometric Hip Flexion with Opposite Arm  - 1 x daily - 3 x weekly - 2 sets - 5 reps - 5 sec hold ? ?[facility] ?[address] ?[place], [place] [address] ?Phone # [phone] ?Fax [phone] ?

## 2021-04-09 DIAGNOSIS — D631 Anemia in chronic kidney disease: Secondary | ICD-10-CM | POA: Diagnosis not present

## 2021-04-09 DIAGNOSIS — N186 End stage renal disease: Secondary | ICD-10-CM | POA: Diagnosis not present

## 2021-04-09 DIAGNOSIS — N2581 Secondary hyperparathyroidism of renal origin: Secondary | ICD-10-CM | POA: Diagnosis not present

## 2021-04-09 DIAGNOSIS — Z992 Dependence on renal dialysis: Secondary | ICD-10-CM | POA: Diagnosis not present

## 2021-04-10 DIAGNOSIS — N269 Renal sclerosis, unspecified: Secondary | ICD-10-CM | POA: Diagnosis not present

## 2021-04-10 DIAGNOSIS — Z992 Dependence on renal dialysis: Secondary | ICD-10-CM | POA: Diagnosis not present

## 2021-04-10 DIAGNOSIS — N186 End stage renal disease: Secondary | ICD-10-CM | POA: Diagnosis not present

## 2021-04-11 DIAGNOSIS — D631 Anemia in chronic kidney disease: Secondary | ICD-10-CM | POA: Diagnosis not present

## 2021-04-11 DIAGNOSIS — N186 End stage renal disease: Secondary | ICD-10-CM | POA: Diagnosis not present

## 2021-04-11 DIAGNOSIS — Z992 Dependence on renal dialysis: Secondary | ICD-10-CM | POA: Diagnosis not present

## 2021-04-11 DIAGNOSIS — D509 Iron deficiency anemia, unspecified: Secondary | ICD-10-CM | POA: Diagnosis not present

## 2021-04-11 DIAGNOSIS — N2581 Secondary hyperparathyroidism of renal origin: Secondary | ICD-10-CM | POA: Diagnosis not present

## 2021-04-14 DIAGNOSIS — Z992 Dependence on renal dialysis: Secondary | ICD-10-CM | POA: Diagnosis not present

## 2021-04-14 DIAGNOSIS — N2581 Secondary hyperparathyroidism of renal origin: Secondary | ICD-10-CM | POA: Diagnosis not present

## 2021-04-14 DIAGNOSIS — D509 Iron deficiency anemia, unspecified: Secondary | ICD-10-CM | POA: Diagnosis not present

## 2021-04-14 DIAGNOSIS — D631 Anemia in chronic kidney disease: Secondary | ICD-10-CM | POA: Diagnosis not present

## 2021-04-14 DIAGNOSIS — N186 End stage renal disease: Secondary | ICD-10-CM | POA: Diagnosis not present

## 2021-04-15 ENCOUNTER — Encounter: Payer: Self-pay | Admitting: Physical Therapy

## 2021-04-15 ENCOUNTER — Ambulatory Visit: Payer: Medicare Other | Attending: Family Medicine | Admitting: Physical Therapy

## 2021-04-15 DIAGNOSIS — R278 Other lack of coordination: Secondary | ICD-10-CM | POA: Insufficient documentation

## 2021-04-15 DIAGNOSIS — M6281 Muscle weakness (generalized): Secondary | ICD-10-CM | POA: Diagnosis not present

## 2021-04-15 DIAGNOSIS — E782 Mixed hyperlipidemia: Secondary | ICD-10-CM | POA: Diagnosis not present

## 2021-04-15 DIAGNOSIS — I10 Essential (primary) hypertension: Secondary | ICD-10-CM | POA: Diagnosis not present

## 2021-04-15 DIAGNOSIS — R7303 Prediabetes: Secondary | ICD-10-CM | POA: Diagnosis not present

## 2021-04-15 NOTE — Therapy (Signed)
[place] ?[facility] ?[place], [place], [address] ?Phone: [phone]   Fax:  [phone] ? ?Physical Therapy Treatment ? ?Patient Details  ?Name: Katie Nunez ?MRN: [id number] ?Date of Birth: 1955-05-05 ?Referring Provider (PT): Dr. [name] ? ? ?Encounter Date: 04/15/2021 ? ? PT End of Session - 04/15/21 1450   ? ? Visit Number 8   ? Date for PT Re-Evaluation 07/08/21   ? Authorization Type UHC medicare   ? Authorization - Visit Number 8   ? Authorization - Number of Visits 10   ? PT Start Time 5038   ? PT Stop Time 1525   ? PT Time Calculation (min) 40 min   ? Activity Tolerance Patient tolerated treatment well   ? Behavior During Therapy [facility] for tasks assessed/performed   ? ?  ?  ? ?  ? ? ?Past Medical History:  ?Diagnosis Date  ? Achalasia   ? Anxiety   ? Dysplasia of cervix, low grade (CIN 1)   ? Environmental allergies   ? "all year long" (12/27/2016)  ? ESRD (end stage renal disease) on dialysis [facility])   ? "TTS; [place]" (12/27/2016)  ? Fibromyalgia   ? GERD (gastroesophageal reflux disease)   ? Gout   ? Hypertension   ? IBS (irritable bowel syndrome)   ? MVP (mitral valve prolapse)   ? RA (rheumatoid arthritis) ([place])   ? FOLLOWED BY DR. SHANAHAN  ? Raynaud's disease   ? Scleroderma ([place])   ? Seasonal allergies   ? Thrombocytopenia ([place]) 07/01/2016  ? Acute fall to 13,000 07/01/16  ? Tubular adenoma 01/08/2008  ? CECUM  ? Vitamin D deficiency   ? ? ?Past Surgical History:  ?Procedure Laterality Date  ? ANKLE FRACTURE SURGERY Right   ? AV FISTULA PLACEMENT Left 06/28/2016  ? Procedure: left arm ARTERIOVENOUS (AV) FISTULA CREATION;  Surgeon: Rosetta Posner, MD;  Location: [place];  Service: Vascular;  Laterality: Left;  ? BASCILIC VEIN TRANSPOSITION Left 09/27/2016  ? Procedure: LEFT UPPER ARM CEPHALIC VEIN TRANSPOSITION;  Surgeon: Rosetta Posner, MD;  [facility];  Service: Vascular;  Laterality: Left;  ? BREAST BIOPSY    ? "? side"  ? [place]  ? CO2 LASER OF CERVIX    ? COLONOSCOPY W/ BIOPSIES  01/08/2008  ? INSERTION OF DIALYSIS CATHETER Right 06/28/2016  ? Procedure: INSERTION OF DIALYSIS CATHETER, right internal jugular;  Surgeon: Rosetta Posner, MD;  Location: [place];  Service: Vascular;  Laterality: Right;  ? MYOMECTOMY    ? NASAL ENDOSCOPY WITH EPISTAXIS CONTROL N/A 12/29/2019  ? Procedure: NASAL ENDOSCOPY WITH EPISTAXIS CONTROL;  Surgeon: Leta Baptist, MD;  Location: [facility] [place];  Service: ENT;  Laterality: N/A;  ? PELVIC LAPAROSCOPY  2011  ? superficial thrombophlebitis Left 07-2014  ? ? ?There were no vitals filed for this visit. ? ? ? ? ? ? [facility] PT Assessment - 04/15/21 0001   ? ?  ? Assessment  ? Medical Diagnosis N81.89 Pelvic floor weakness   ? Referring Provider (PT) Dr. [name]   ? Onset Date/Surgical Date --   01/12/2020  ? Prior Therapy not for pelvic floor   ?  ? Precautions  ? Precautions None   ?  ? Restrictions  ? Weight Bearing Restrictions No   ?  ? Cognition  ? Overall Cognitive Status Within Functional Limits for tasks assessed   ?  ? Posture/Postural Control  ?  Posture/Postural Control Postural limitations   ? Postural Limitations Rounded Shoulders;Forward head   ?  ? Strength  ? Right Hip Flexion 4-/5   ? Right Hip Extension 3+/5   ? Right Hip ABduction 3/5   ? Right Hip ADduction 4/5   ? ?  ?  ? ?  ? ? ? ? ? ? ? ? ? ? ? ? ? Pelvic Floor Special Questions - 04/15/21 0001   ? ? Number of C-Sections 1   ? Urinary Leakage No   ? Fecal incontinence Yes   stool type- type 2, 3, 4  ? Falling out feeling (prolapse) Yes   ? Activities that cause feeling of prolapse bulge improved by 30%   ? ?  ?  ? ?  ? ? ? ? [place] Adult PT Treatment/Exercise - 04/15/21 0001   ? ?  ? Lumbar Exercises: Stretches  ? Hip Flexor Stretch Right;2 reps;30 seconds   ?  ? Lumbar Exercises: Aerobic  ? Nustep level 1 for 3 minutes while assess patient   ?  ? Lumbar Exercises: Seated  ? Other Seated Lumbar Exercises sit with ball between knees and shift hip  forward 10x each side   ?  ? Lumbar Exercises: Supine  ? Bent Knee Raise 10 reps;1 second   right, left  ? Bent Knee Raise Limitations feet on squatty potty to work in smaller range, tactile cues to contract the lower abdominals   ? Bridge 15 reps   ? Bridge Limitations therapist has hands on the thighs to assist full hip extension   ?  ? Lumbar Exercises: Sidelying  ? Hip Abduction Right;10 reps   2 sets  ? Hip Abduction Weights (lbs) with assistance on end range   ? Other Sidelying Lumbar Exercises sidely reverse clam with abdominal contraction 15x each leg. needs assistance with right leg   ? ?  ?  ? ?  ? ? ? ? ? ? ? ? ? ? ? ? PT Short Term Goals - 04/15/21 1451   ? ?  ? PT SHORT TERM GOAL #1  ? Title Patient will demonstrate independent use of home exercise program including pelvic floor exercises   ? Time 4   ? Period Weeks   ? Status Achieved   ? Target Date 03/16/21   ? ?  ?  ? ?  ? ? ? ? PT Long Term Goals - 04/15/21 1451   ? ?  ? PT LONG TERM GOAL #1  ? Title Patient will demonstrate advanced HEP for pelvic floor coordination and strength   ? Time 8   ? Period Weeks   ? Status On-going   ? Target Date 04/13/21   ?  ? PT LONG TERM GOAL #2  ? Title Patient will be able to have a bowel movement without straining or pushing by the tailbone to fully empty her stool   ? Baseline improved by 45%   ? Time 8   ? Period Weeks   ? Status On-going   ?  ? PT LONG TERM GOAL #3  ? Title Patient will report her stool leakage has improved >/= 75% due to improved pelvic floor coordination   ? Baseline improved by 70% better   ? Time 8   ? Period Weeks   ? Status On-going   ? Target Date 04/13/21   ?  ? PT LONG TERM GOAL #4  ? Title Patient will be able to demonstrate correct  toileting using the squatty potty to push stool out and not having to assist   ? Time 8   ? Period Weeks   ? Status Achieved   ? ?  ?  ? ?  ? ? ? ? ? ? ? ? Plan - 04/15/21 1501   ? ? Clinical Impression Statement Patient reports her stool leakage is 70%  better. Her prolapse feels 30% better. She is straining with 70% greater ease and feels there is only a little bit left of stool. Patient has increased strength of right hip abduction and extension. Today patient feels tighter and tired today compared to other days. Patient able to do a bridger but needs therapist to guide the thighs forward to go up fully. Patient has not had stool on her depends in 2 weeks. Patient needs assistance with right leg when working on strength due to her weakness from her stroke in the past. Patient will benefit from skilled therapy to improve stool emptying and reduce fecal leakage.   ? Personal Factors and Comorbidities Comorbidity 3+;Time since onset of injury/illness/exacerbation;Past/Current Experience;Fitness   ? Comorbidities Stroke 12/2019; End stage renal disease; Raynauds disease; Sclerodema; RA; Pelvic laproscopy for myomectomy 2011; Dialyssis; C-section   ? Examination-Activity Limitations Continence;Toileting   ? Stability/Clinical Decision Making Stable/Uncomplicated   ? Rehab Potential Good   ? PT Frequency 1x / week   ? PT Duration 12 weeks   ? PT Treatment/Interventions ADLs/Self Care Home Management;Biofeedback;Therapeutic activities;Therapeutic exercise;Neuromuscular re-education;Patient/family education;Manual techniques;Spinal Manipulations   ? PT Next Visit Plan work on hip strength and pelvic strength, update HEP with new exercises   ? PT Home Exercise Plan Access Code: [email or web address]   ? Recommended Other Services MD signed all notes. Renewal was sent today.   ? Consulted and Agree with Plan of Care Patient   ? ?  ?  ? ?  ? ? ?Patient will benefit from skilled therapeutic intervention in order to improve the following deficits and impairments:  Decreased range of motion ? ?Visit Diagnosis: ?Muscle weakness (generalized) ? ?Other lack of coordination ? ? ? ? ?Problem List ?Patient Active Problem List  ? Diagnosis Date Noted  ? Neck pain 10/30/2020  ? Knee locking, left  10/05/2020  ? Abnormality of gait 02/25/2020  ? Protein-calorie malnutrition, severe 01/19/2020  ? Epistaxis   ? Sleep disturbance   ? Slow transit constipation   ? Hemorrhoids   ? Chronic systolic co

## 2021-04-16 DIAGNOSIS — Z992 Dependence on renal dialysis: Secondary | ICD-10-CM | POA: Diagnosis not present

## 2021-04-16 DIAGNOSIS — N2581 Secondary hyperparathyroidism of renal origin: Secondary | ICD-10-CM | POA: Diagnosis not present

## 2021-04-16 DIAGNOSIS — N186 End stage renal disease: Secondary | ICD-10-CM | POA: Diagnosis not present

## 2021-04-16 DIAGNOSIS — D631 Anemia in chronic kidney disease: Secondary | ICD-10-CM | POA: Diagnosis not present

## 2021-04-16 DIAGNOSIS — D509 Iron deficiency anemia, unspecified: Secondary | ICD-10-CM | POA: Diagnosis not present

## 2021-04-18 DIAGNOSIS — N2581 Secondary hyperparathyroidism of renal origin: Secondary | ICD-10-CM | POA: Diagnosis not present

## 2021-04-18 DIAGNOSIS — N186 End stage renal disease: Secondary | ICD-10-CM | POA: Diagnosis not present

## 2021-04-18 DIAGNOSIS — Z992 Dependence on renal dialysis: Secondary | ICD-10-CM | POA: Diagnosis not present

## 2021-04-18 DIAGNOSIS — D509 Iron deficiency anemia, unspecified: Secondary | ICD-10-CM | POA: Diagnosis not present

## 2021-04-18 DIAGNOSIS — D631 Anemia in chronic kidney disease: Secondary | ICD-10-CM | POA: Diagnosis not present

## 2021-04-19 DIAGNOSIS — I270 Primary pulmonary hypertension: Secondary | ICD-10-CM | POA: Diagnosis not present

## 2021-04-19 DIAGNOSIS — N186 End stage renal disease: Secondary | ICD-10-CM | POA: Diagnosis not present

## 2021-04-19 DIAGNOSIS — R0609 Other forms of dyspnea: Secondary | ICD-10-CM | POA: Diagnosis not present

## 2021-04-20 DIAGNOSIS — G894 Chronic pain syndrome: Secondary | ICD-10-CM | POA: Diagnosis not present

## 2021-04-20 DIAGNOSIS — M3210 Systemic lupus erythematosus, organ or system involvement unspecified: Secondary | ICD-10-CM | POA: Diagnosis not present

## 2021-04-20 DIAGNOSIS — I10 Essential (primary) hypertension: Secondary | ICD-10-CM | POA: Diagnosis not present

## 2021-04-20 DIAGNOSIS — Z1389 Encounter for screening for other disorder: Secondary | ICD-10-CM | POA: Diagnosis not present

## 2021-04-20 DIAGNOSIS — M797 Fibromyalgia: Secondary | ICD-10-CM | POA: Diagnosis not present

## 2021-04-20 DIAGNOSIS — Z0000 Encounter for general adult medical examination without abnormal findings: Secondary | ICD-10-CM | POA: Diagnosis not present

## 2021-04-20 DIAGNOSIS — N186 End stage renal disease: Secondary | ICD-10-CM | POA: Diagnosis not present

## 2021-04-20 DIAGNOSIS — D631 Anemia in chronic kidney disease: Secondary | ICD-10-CM | POA: Diagnosis not present

## 2021-04-20 DIAGNOSIS — E538 Deficiency of other specified B group vitamins: Secondary | ICD-10-CM | POA: Diagnosis not present

## 2021-04-20 DIAGNOSIS — E782 Mixed hyperlipidemia: Secondary | ICD-10-CM | POA: Diagnosis not present

## 2021-04-20 DIAGNOSIS — M349 Systemic sclerosis, unspecified: Secondary | ICD-10-CM | POA: Diagnosis not present

## 2021-04-20 DIAGNOSIS — R7303 Prediabetes: Secondary | ICD-10-CM | POA: Diagnosis not present

## 2021-04-21 DIAGNOSIS — D509 Iron deficiency anemia, unspecified: Secondary | ICD-10-CM | POA: Diagnosis not present

## 2021-04-21 DIAGNOSIS — Z992 Dependence on renal dialysis: Secondary | ICD-10-CM | POA: Diagnosis not present

## 2021-04-21 DIAGNOSIS — D631 Anemia in chronic kidney disease: Secondary | ICD-10-CM | POA: Diagnosis not present

## 2021-04-21 DIAGNOSIS — N186 End stage renal disease: Secondary | ICD-10-CM | POA: Diagnosis not present

## 2021-04-21 DIAGNOSIS — N2581 Secondary hyperparathyroidism of renal origin: Secondary | ICD-10-CM | POA: Diagnosis not present

## 2021-04-22 ENCOUNTER — Ambulatory Visit: Payer: Medicare Other | Admitting: Physical Therapy

## 2021-04-22 ENCOUNTER — Encounter: Payer: Self-pay | Admitting: Physical Therapy

## 2021-04-22 DIAGNOSIS — L810 Postinflammatory hyperpigmentation: Secondary | ICD-10-CM | POA: Diagnosis not present

## 2021-04-22 DIAGNOSIS — R278 Other lack of coordination: Secondary | ICD-10-CM | POA: Diagnosis not present

## 2021-04-22 DIAGNOSIS — L219 Seborrheic dermatitis, unspecified: Secondary | ICD-10-CM | POA: Diagnosis not present

## 2021-04-22 DIAGNOSIS — L503 Dermatographic urticaria: Secondary | ICD-10-CM | POA: Diagnosis not present

## 2021-04-22 DIAGNOSIS — M6281 Muscle weakness (generalized): Secondary | ICD-10-CM | POA: Diagnosis not present

## 2021-04-22 DIAGNOSIS — M349 Systemic sclerosis, unspecified: Secondary | ICD-10-CM | POA: Diagnosis not present

## 2021-04-22 NOTE — Therapy (Signed)
[place] ?[facility] [place] ?[place], [place], [address] ?Phone: [phone]   Fax:  [phone] ? ?Physical Therapy Treatment ? ?Patient Details  ?Name: Katie Nunez ?MRN: [id number] ?Date of Birth: July 01, 1955 ?Referring Provider (PT): Dr. [name] ? ? ?Encounter Date: 04/22/2021 ? ? PT End of Session - 04/22/21 1237   ? ? Visit Number 9   ? Date for PT Re-Evaluation 07/08/21   ? Authorization Type UHC medicare   ? Authorization - Visit Number 9   ? Authorization - Number of Visits 10   ? PT Start Time 1230   ? PT Stop Time 1308   ? PT Time Calculation (min) 38 min   ? Activity Tolerance Patient tolerated treatment well   ? Behavior During Therapy [facility] for tasks assessed/performed   ? ?  ?  ? ?  ? ? ?Past Medical History:  ?Diagnosis Date  ? Achalasia   ? Anxiety   ? Dysplasia of cervix, low grade (CIN 1)   ? Environmental allergies   ? "all year long" (12/27/2016)  ? ESRD (end stage renal disease) on dialysis [facility])   ? "TTS; [place]" (12/27/2016)  ? Fibromyalgia   ? GERD (gastroesophageal reflux disease)   ? Gout   ? Hypertension   ? IBS (irritable bowel syndrome)   ? MVP (mitral valve prolapse)   ? RA (rheumatoid arthritis) ([place])   ? FOLLOWED BY DR. SHANAHAN  ? Raynaud's disease   ? Scleroderma ([place])   ? Seasonal allergies   ? Thrombocytopenia ([place]) 07/01/2016  ? Acute fall to 13,000 07/01/16  ? Tubular adenoma 01/08/2008  ? CECUM  ? Vitamin D deficiency   ? ? ?Past Surgical History:  ?Procedure Laterality Date  ? ANKLE FRACTURE SURGERY Right   ? AV FISTULA PLACEMENT Left 06/28/2016  ? Procedure: left arm ARTERIOVENOUS (AV) FISTULA CREATION;  Surgeon: Rosetta Posner, MD;  Location: [place];  Service: Vascular;  Laterality: Left;  ? BASCILIC VEIN TRANSPOSITION Left 09/27/2016  ? Procedure: LEFT UPPER ARM CEPHALIC VEIN TRANSPOSITION;  Surgeon: Rosetta Posner, MD;  [facility];  Service: Vascular;  Laterality: Left;  ? BREAST BIOPSY    ? "? side"  ?  [place]  ? CO2 LASER OF CERVIX    ? COLONOSCOPY W/ BIOPSIES  01/08/2008  ? INSERTION OF DIALYSIS CATHETER Right 06/28/2016  ? Procedure: INSERTION OF DIALYSIS CATHETER, right internal jugular;  Surgeon: Rosetta Posner, MD;  Location: [place];  Service: Vascular;  Laterality: Right;  ? MYOMECTOMY    ? NASAL ENDOSCOPY WITH EPISTAXIS CONTROL N/A 12/29/2019  ? Procedure: NASAL ENDOSCOPY WITH EPISTAXIS CONTROL;  Surgeon: Leta Baptist, MD;  Location: [place];  Service: ENT;  Laterality: N/A;  ? PELVIC LAPAROSCOPY  2011  ? superficial thrombophlebitis Left 07-2014  ? ? ?There were no vitals filed for this visit. ? ? Subjective Assessment - 04/22/21 1239   ? ? Subjective I feel like the stool is not coming striaght and is coming into the side. A good amount comes out but the last bit that breaks off is harder to get out.   ? Patient Stated Goals learn how to expel the stool without putting had no tailbone to push the stool out   ? Currently in Pain? No/denies   ? Multiple Pain Sites No   ? ?  ?  ? ?  ? ? ? ? ? ? ? ? ? ? ? ? ? ? ? ? ?  Pelvic Floor Special Questions - 04/22/21 0001   ? ? Pelvic Floor Internal Exam Patient confirms identification and approves PT to assess pelvic floor and treatment   ? Exam Type Rectal   ? Palpation no stool in the rectum, able to push the therapist finger out of the rectum   ? Strength good squeeze, good lift, able to hold agaisnt strong resistance   ? ?  ?  ? ?  ? ? ? ? [facility] Adult PT Treatment/Exercise - 04/22/21 0001   ? ?  ? Self-Care  ? Self-Care Other Self-Care Comments   ? Other Self-Care Comments  Patient is instructed to contract the pelvic floor 5 times after a bowel movement or when she feels the stool is stuck in the rectum   ?  ? Lumbar Exercises: Aerobic  ? Nustep level 1 for 5 minutes while assess patient and took 3 breaks   ?  ? Lumbar Exercises: Supine  ? Isometric Hip Flexion 10 reps;1 second   left, right  ? Isometric Hip Flexion Limitations with pelvic floor and  abdominal contraction; then did 10x each opposite side.   ?  ? Manual Therapy  ? Manual Therapy Internal Pelvic Floor   ? Internal Pelvic Floor manual work to the puborectalis, anococcygeal ligament, and right iliococcygeus   ? ?  ?  ? ?  ? ? ? ? ? ? ? ? ? ? PT Education - 04/22/21 1308   ? ? Education Details educated patient to contract her pelvic floor 5 times when she has the urge to have a bowel movement and after toileting.   ? Person(s) Educated Patient   ? Methods Explanation;Demonstration   ? Comprehension Verbalized understanding;Returned demonstration   ? ?  ?  ? ?  ? ? ? PT Short Term Goals - 04/15/21 1451   ? ?  ? PT SHORT TERM GOAL #1  ? Title Patient will demonstrate independent use of home exercise program including pelvic floor exercises   ? Time 4   ? Period Weeks   ? Status Achieved   ? Target Date 03/16/21   ? ?  ?  ? ?  ? ? ? ? PT Long Term Goals - 04/22/21 1304   ? ?  ? PT LONG TERM GOAL #1  ? Title Patient will demonstrate advanced HEP for pelvic floor coordination and strength   ? Time 8   ? Period Weeks   ? Status On-going   ? Target Date 04/13/21   ?  ? PT LONG TERM GOAL #2  ? Title Patient will be able to have a bowel movement without straining or pushing by the tailbone to fully empty her stool   ? Baseline patient does not immediately run to the bathroom and is 55% better.   ? Time 8   ? Period Weeks   ? Status On-going   ? Target Date 04/13/21   ?  ? PT LONG TERM GOAL #3  ? Title Patient will report her stool leakage has improved >/= 75% due to improved pelvic floor coordination   ? Baseline improved by 70% better   ? Time 8   ? Period Weeks   ? Status On-going   ? ?  ?  ? ?  ? ? ? ? ? ? ? ? Plan - 04/22/21 1238   ? ? Clinical Impression Statement Patient is able to wait to go to the bathroom and not go immediately when she feels she  is going to have a bowel movement. Pelvic floor strength is 4/5. She is able to push the therapist finger out of the rectum. She feels there is stool in  the rectum but the therapist did not feel any. Patient had one time so a small spot of fecal leakage since last visit. Patient can feel the lower abdomen contract with rectal contraction. After the manual work today she felt 60% better that she is not sitting on anything. Patient will benefit from skilled therapy to improve stool emptying and reduce fecal leakage.   ? Personal Factors and Comorbidities Comorbidity 3+;Time since onset of injury/illness/exacerbation;Past/Current Experience;Fitness   ? Comorbidities Stroke 12/2019; End stage renal disease; Raynauds disease; Sclerodema; RA; Pelvic laproscopy for myomectomy 2011; Dialyssis; C-section   ? Examination-Activity Limitations Continence;Toileting   ? Stability/Clinical Decision Making Stable/Uncomplicated   ? Rehab Potential Good   ? PT Frequency 1x / week   ? PT Duration 12 weeks   ? PT Treatment/Interventions ADLs/Self Care Home Management;Biofeedback;Therapeutic activities;Therapeutic exercise;Neuromuscular re-education;Patient/family education;Manual techniques;Spinal Manipulations   ? PT Next Visit Plan work on hip strength and pelvic strength, update HEP with new exercises; 10th visit not 2/10-4/17   ? PT Home Exercise Plan Access Code: [email or web address]   ? Consulted and Agree with Plan of Care Patient   ? ?  ?  ? ?  ? ? ?Patient will benefit from skilled therapeutic intervention in order to improve the following deficits and impairments:  Decreased range of motion ? ?Visit Diagnosis: ?Muscle weakness (generalized) ? ?Other lack of coordination ? ? ? ? ?Problem List ?Patient Active Problem List  ? Diagnosis Date Noted  ? Neck pain 10/30/2020  ? Knee locking, left 10/05/2020  ? Abnormality of gait 02/25/2020  ? Protein-calorie malnutrition, severe 01/19/2020  ? Epistaxis   ? Sleep disturbance   ? Slow transit constipation   ? Hemorrhoids   ? Chronic systolic congestive heart failure ([place])   ? ESRD on dialysis [facility])   ? Right hemiparesis ([place])   ? Pressure injury  of skin 12/20/2019  ? Intraparenchymal hemorrhage of brain ([place]) 12/18/2019  ? ICH (intracerebral hemorrhage) ([place]) 12/12/2019  ? Viral disease 08/22/2019  ? Chronic right-sided heart failure ([place]) 07/09/2019  ? Sup

## 2021-04-23 DIAGNOSIS — N2581 Secondary hyperparathyroidism of renal origin: Secondary | ICD-10-CM | POA: Diagnosis not present

## 2021-04-23 DIAGNOSIS — Z992 Dependence on renal dialysis: Secondary | ICD-10-CM | POA: Diagnosis not present

## 2021-04-23 DIAGNOSIS — D509 Iron deficiency anemia, unspecified: Secondary | ICD-10-CM | POA: Diagnosis not present

## 2021-04-23 DIAGNOSIS — D631 Anemia in chronic kidney disease: Secondary | ICD-10-CM | POA: Diagnosis not present

## 2021-04-23 DIAGNOSIS — N186 End stage renal disease: Secondary | ICD-10-CM | POA: Diagnosis not present

## 2021-04-24 DIAGNOSIS — K029 Dental caries, unspecified: Secondary | ICD-10-CM | POA: Diagnosis not present

## 2021-04-25 DIAGNOSIS — N2581 Secondary hyperparathyroidism of renal origin: Secondary | ICD-10-CM | POA: Diagnosis not present

## 2021-04-25 DIAGNOSIS — N186 End stage renal disease: Secondary | ICD-10-CM | POA: Diagnosis not present

## 2021-04-25 DIAGNOSIS — D631 Anemia in chronic kidney disease: Secondary | ICD-10-CM | POA: Diagnosis not present

## 2021-04-25 DIAGNOSIS — Z992 Dependence on renal dialysis: Secondary | ICD-10-CM | POA: Diagnosis not present

## 2021-04-25 DIAGNOSIS — D509 Iron deficiency anemia, unspecified: Secondary | ICD-10-CM | POA: Diagnosis not present

## 2021-04-27 ENCOUNTER — Encounter: Payer: Self-pay | Admitting: Physical Therapy

## 2021-04-27 ENCOUNTER — Ambulatory Visit: Payer: Medicare Other | Admitting: Physical Therapy

## 2021-04-27 DIAGNOSIS — R278 Other lack of coordination: Secondary | ICD-10-CM

## 2021-04-27 DIAGNOSIS — M6281 Muscle weakness (generalized): Secondary | ICD-10-CM

## 2021-04-27 NOTE — Patient Instructions (Addendum)
Insoluble Fiber ? ?     Functions of Insoluble Fiber ?moves bulk through the intestines  ?controls and balances the pH (acidity) in the intestines ? ?     Benefits of Insoluble Fiber ?promotes regular bowel movement and prevents constipation  ?removes fecal waste through colon in less time  ?keeps an optimal pH in intestines to prevent microbes from producing cancer substances, therefore preventing colon cancer  ? ?     Food Sources of Insoluble Fiber ?whole-wheat products  ?wheat bran ?miller's bran? ?corn bran  ?flax seed or other seeds ?vegetables such as green beans, broccoli, cauliflower and potato skins  ?fruit skins and root vegetable skins  ?popcorn ?brown rice ? ?Access Code: [email or web address] ?URL: [email or web address] ?Date: 04/27/2021 ?Prepared by: Earlie Counts ? ?Exercises ?- - Seated Pelvic Floor Contraction  - 3 x daily - 7 x weekly - 1 sets - 10 reps - 5 reps hold ?- Seated Quick Flick Pelvic Floor Contractions  - 3 x daily - 7 x weekly - 1 sets - 5 reps ?- Standing Hip Abduction with Counter Support  - 1 x daily - 3 x weekly - 1 sets - 10 reps ?- Standing Hip Abduction with Counter Support (Mirrored)  - 1 x daily - 3 x weekly - 1 sets - 10 reps ? ?[place] ?[address], [address] ?[place], [place] [address] ?Phone # [phone] ?Fax [phone] ? ?

## 2021-04-27 NOTE — Therapy (Signed)
[place] ?[facility] [place] ?[place], [place], [address] ?Phone: [phone]   Fax:  [phone] ? ?Physical Therapy Treatment ? ?Patient Details  ?Name: Katie Nunez ?MRN: [id number] ?Date of Birth: 1955-04-14 ?Referring Provider (PT): Dr. [name] ? ?Progress Note ?Reporting Period 02/20/2021 to 04/27/2021 ? ?See note below for Objective Data and Assessment of Progress/Goals.  ? ?  ?Encounter Date: 04/27/2021 ? ? PT End of Session - 04/27/21 1407   ? ? Visit Number 10   ? Date for PT Re-Evaluation 07/08/21   ? Authorization Type UHC medicare   ? Authorization - Visit Number 10   ? Authorization - Number of Visits 10   ? PT Start Time 1400   ? PT Stop Time 1440   ? PT Time Calculation (min) 40 min   ? Activity Tolerance Patient tolerated treatment well   ? Behavior During Therapy [facility] for tasks assessed/performed   ? ?  ?  ? ?  ? ? ?Past Medical History:  ?Diagnosis Date  ? Achalasia   ? Anxiety   ? Dysplasia of cervix, low grade (CIN 1)   ? Environmental allergies   ? "all year long" (12/27/2016)  ? ESRD (end stage renal disease) on dialysis [facility])   ? "TTS; [place]" (12/27/2016)  ? Fibromyalgia   ? GERD (gastroesophageal reflux disease)   ? Gout   ? Hypertension   ? IBS (irritable bowel syndrome)   ? MVP (mitral valve prolapse)   ? RA (rheumatoid arthritis) ([place])   ? FOLLOWED BY DR. SHANAHAN  ? Raynaud's disease   ? Scleroderma ([place])   ? Seasonal allergies   ? Thrombocytopenia ([place]) 07/01/2016  ? Acute fall to 13,000 07/01/16  ? Tubular adenoma 01/08/2008  ? CECUM  ? Vitamin D deficiency   ? ? ?Past Surgical History:  ?Procedure Laterality Date  ? ANKLE FRACTURE SURGERY Right   ? AV FISTULA PLACEMENT Left 06/28/2016  ? Procedure: left arm ARTERIOVENOUS (AV) FISTULA CREATION;  Surgeon: Rosetta Posner, MD;  Location: [place];  Service: Vascular;  Laterality: Left;  ? BASCILIC VEIN TRANSPOSITION Left 09/27/2016  ? Procedure: LEFT UPPER ARM CEPHALIC VEIN  TRANSPOSITION;  Surgeon: Rosetta Posner, MD;  [facility];  Service: Vascular;  Laterality: Left;  ? BREAST BIOPSY    ? "? side"  ? [place]  ? CO2 LASER OF CERVIX    ? COLONOSCOPY W/ BIOPSIES  01/08/2008  ? INSERTION OF DIALYSIS CATHETER Right 06/28/2016  ? Procedure: INSERTION OF DIALYSIS CATHETER, right internal jugular;  Surgeon: Rosetta Posner, MD;  Location: [place];  Service: Vascular;  Laterality: Right;  ? MYOMECTOMY    ? NASAL ENDOSCOPY WITH EPISTAXIS CONTROL N/A 12/29/2019  ? Procedure: NASAL ENDOSCOPY WITH EPISTAXIS CONTROL;  Surgeon: Leta Baptist, MD;  Location: [facility];  Service: ENT;  Laterality: N/A;  ? PELVIC LAPAROSCOPY  2011  ? superficial thrombophlebitis Left 07-2014  ? ? ?There were no vitals filed for this visit. ? ? Subjective Assessment - 04/27/21 1408   ? ? Subjective I had a couple of teeth pulled out and still getting my energy. I have done the exercise to help with the urge. I was able to delay the urge for quite awhile.   ? Patient Stated Goals learn how to expel the stool without putting had no tailbone to push the stool out   ? Currently in Pain? No/denies   ? Multiple Pain  Sites No   ? ?  ?  ? ?  ? ? ? ? ? [facility] PT Assessment - 04/27/21 0001   ? ?  ? Strength  ? Right Hip Flexion 4-/5   ? Right Hip Extension 3+/5   ? Right Hip ABduction 3/5   ? Right Hip ADduction 4/5   ? ?  ?  ? ?  ? ? ? ? ? ? ? ? ? ? ? ? ? Pelvic Floor Special Questions - 04/27/21 0001   ? ? Number of C-Sections 1   ? Urinary Leakage No   ? Fecal incontinence Yes   stool type- type 2, 3, 4  ? Falling out feeling (prolapse) Yes   ? Activities that cause feeling of prolapse bulge improved by 30%   ? Strength good squeeze, good lift, able to hold agaisnt strong resistance   ? ?  ?  ? ?  ? ? ? ? [facility] Adult PT Treatment/Exercise - 04/27/21 0001   ? ?  ? Self-Care  ? Self-Care Other Self-Care Comments   ? Other Self-Care Comments  educated patient for pumping the stool out with contract relax   ?  ? Neuro Re-ed   ?  Neuro Re-ed Details  contract the pelvic floor for 5 seconds 62Z then 5 quick flicks with keep space between the pubic bone and breast bone and not hold her breath   ?  ? Knee/Hip Exercises: Standing  ? Hip Abduction Stengthening;Right;Left;1 set;10 reps   ? Abduction Limitations wit pelvic floor contraction , holding onto the counter   ? ?  ?  ? ?  ? ? ? ? ? ? ? ? ? ? PT Education - 04/27/21 1441   ? ? Education Details Access Code: [email or web address]; add fiber to her diet to firm up the stool, pumping the stool   ? Person(s) Educated Patient   ? Methods Explanation;Demonstration;Verbal cues;Handout   ? Comprehension Returned demonstration;Verbalized understanding   ? ?  ?  ? ?  ? ? ? PT Short Term Goals - 04/27/21 1408   ? ?  ? PT SHORT TERM GOAL #1  ? Title Patient will demonstrate independent use of home exercise program including pelvic floor exercises   ? Time 4   ? Period Weeks   ? Status Achieved   ? Target Date 03/16/21   ? ?  ?  ? ?  ? ? ? ? PT Long Term Goals - 04/27/21 1409   ? ?  ? PT LONG TERM GOAL #1  ? Title Patient will demonstrate advanced HEP for pelvic floor coordination and strength   ? Baseline still learning exercises to strengthening the pelvic floor   ? Time 8   ? Status On-going   ? Target Date 04/13/21   ?  ? PT LONG TERM GOAL #2  ? Title Patient will be able to have a bowel movement without straining or pushing by the tailbone to fully empty her stool   ? Baseline patient does not immediately run to the bathroom and is 55% better.   ? Time 8   ? Period Weeks   ? Status On-going   ? Target Date 04/13/21   ?  ? PT LONG TERM GOAL #3  ? Title Patient will report her stool leakage has improved >/= 75% due to improved pelvic floor coordination   ? Baseline improved by 70% better   ? Time 8   ? Period Weeks   ?  Status On-going   ? Target Date 04/13/21   ?  ? PT LONG TERM GOAL #4  ? Title Patient will be able to demonstrate correct  toileting using the squatty potty to push stool out and not having to  assist   ? Time 8   ? Period Weeks   ? Status Achieved   ? Target Date 04/13/21   ? ?  ?  ? ?  ? ? ? ? ? ? ? ? Plan - 04/27/21 1558   ? ? Clinical Impression Statement Patient pelvic floor strength 4/5. Patient is able to wait to go to the bathroom and not going immediately when she feels she is going to have a bowel  movement. Patient has to strain to get the last bit of her stool out. Patient will start to pump her stool out with contract relax. Patient will contract her lower rectal contraction. Patient is able to delay the urge to void with the stool using the contraction of the muscles. Patient has increased in hip strength. Patient will benefit from skilled therapy to improve stool emptying and reduce fecal leakage.   ? Personal Factors and Comorbidities Comorbidity 3+;Time since onset of injury/illness/exacerbation;Past/Current Experience;Fitness   ? Comorbidities Stroke 12/2019; End stage renal disease; Raynauds disease; Sclerodema; RA; Pelvic laproscopy for myomectomy 2011; Dialyssis; C-section   ? Examination-Activity Limitations Continence;Toileting   ? Stability/Clinical Decision Making Stable/Uncomplicated   ? Rehab Potential Good   ? PT Frequency 1x / week   ? PT Duration 12 weeks   ? PT Treatment/Interventions ADLs/Self Care Home Management;Biofeedback;Therapeutic activities;Therapeutic exercise;Neuromuscular re-education;Patient/family education;Manual techniques;Spinal Manipulations   ? PT Next Visit Plan work on hip strength and pelvic strength, update HEP with new exercises;   ? PT Home Exercise Plan Access Code: [email or web address]   ? Consulted and Agree with Plan of Care Patient   ? ?  ?  ? ?  ? ? ?Patient will benefit from skilled therapeutic intervention in order to improve the following deficits and impairments:  Decreased range of motion ? ?Visit Diagnosis: ?Muscle weakness (generalized) ? ?Other lack of coordination ? ? ? ? ?Problem List ?Patient Active Problem List  ? Diagnosis Date Noted  ? Neck  pain 10/30/2020  ? Knee locking, left 10/05/2020  ? Abnormality of gait 02/25/2020  ? Protein-calorie malnutrition, severe 01/19/2020  ? Epistaxis   ? Sleep disturbance   ? Slow transit constipation   ? Hemorrhoid

## 2021-04-28 DIAGNOSIS — D509 Iron deficiency anemia, unspecified: Secondary | ICD-10-CM | POA: Diagnosis not present

## 2021-04-28 DIAGNOSIS — Z992 Dependence on renal dialysis: Secondary | ICD-10-CM | POA: Diagnosis not present

## 2021-04-28 DIAGNOSIS — D631 Anemia in chronic kidney disease: Secondary | ICD-10-CM | POA: Diagnosis not present

## 2021-04-28 DIAGNOSIS — N186 End stage renal disease: Secondary | ICD-10-CM | POA: Diagnosis not present

## 2021-04-28 DIAGNOSIS — N2581 Secondary hyperparathyroidism of renal origin: Secondary | ICD-10-CM | POA: Diagnosis not present

## 2021-04-30 DIAGNOSIS — Z992 Dependence on renal dialysis: Secondary | ICD-10-CM | POA: Diagnosis not present

## 2021-04-30 DIAGNOSIS — D631 Anemia in chronic kidney disease: Secondary | ICD-10-CM | POA: Diagnosis not present

## 2021-04-30 DIAGNOSIS — D509 Iron deficiency anemia, unspecified: Secondary | ICD-10-CM | POA: Diagnosis not present

## 2021-04-30 DIAGNOSIS — N186 End stage renal disease: Secondary | ICD-10-CM | POA: Diagnosis not present

## 2021-04-30 DIAGNOSIS — N2581 Secondary hyperparathyroidism of renal origin: Secondary | ICD-10-CM | POA: Diagnosis not present

## 2021-05-02 DIAGNOSIS — D509 Iron deficiency anemia, unspecified: Secondary | ICD-10-CM | POA: Diagnosis not present

## 2021-05-02 DIAGNOSIS — D631 Anemia in chronic kidney disease: Secondary | ICD-10-CM | POA: Diagnosis not present

## 2021-05-02 DIAGNOSIS — Z992 Dependence on renal dialysis: Secondary | ICD-10-CM | POA: Diagnosis not present

## 2021-05-02 DIAGNOSIS — N186 End stage renal disease: Secondary | ICD-10-CM | POA: Diagnosis not present

## 2021-05-02 DIAGNOSIS — N2581 Secondary hyperparathyroidism of renal origin: Secondary | ICD-10-CM | POA: Diagnosis not present

## 2021-05-04 ENCOUNTER — Encounter: Payer: Medicare Other | Admitting: Physical Therapy

## 2021-05-05 DIAGNOSIS — N2581 Secondary hyperparathyroidism of renal origin: Secondary | ICD-10-CM | POA: Diagnosis not present

## 2021-05-05 DIAGNOSIS — D631 Anemia in chronic kidney disease: Secondary | ICD-10-CM | POA: Diagnosis not present

## 2021-05-05 DIAGNOSIS — D509 Iron deficiency anemia, unspecified: Secondary | ICD-10-CM | POA: Diagnosis not present

## 2021-05-05 DIAGNOSIS — Z992 Dependence on renal dialysis: Secondary | ICD-10-CM | POA: Diagnosis not present

## 2021-05-05 DIAGNOSIS — N186 End stage renal disease: Secondary | ICD-10-CM | POA: Diagnosis not present

## 2021-05-07 DIAGNOSIS — Z992 Dependence on renal dialysis: Secondary | ICD-10-CM | POA: Diagnosis not present

## 2021-05-07 DIAGNOSIS — N186 End stage renal disease: Secondary | ICD-10-CM | POA: Diagnosis not present

## 2021-05-07 DIAGNOSIS — N2581 Secondary hyperparathyroidism of renal origin: Secondary | ICD-10-CM | POA: Diagnosis not present

## 2021-05-07 DIAGNOSIS — D631 Anemia in chronic kidney disease: Secondary | ICD-10-CM | POA: Diagnosis not present

## 2021-05-07 DIAGNOSIS — D509 Iron deficiency anemia, unspecified: Secondary | ICD-10-CM | POA: Diagnosis not present

## 2021-05-09 DIAGNOSIS — N186 End stage renal disease: Secondary | ICD-10-CM | POA: Diagnosis not present

## 2021-05-09 DIAGNOSIS — D631 Anemia in chronic kidney disease: Secondary | ICD-10-CM | POA: Diagnosis not present

## 2021-05-09 DIAGNOSIS — N2581 Secondary hyperparathyroidism of renal origin: Secondary | ICD-10-CM | POA: Diagnosis not present

## 2021-05-09 DIAGNOSIS — D509 Iron deficiency anemia, unspecified: Secondary | ICD-10-CM | POA: Diagnosis not present

## 2021-05-09 DIAGNOSIS — Z992 Dependence on renal dialysis: Secondary | ICD-10-CM | POA: Diagnosis not present

## 2021-05-10 DIAGNOSIS — N186 End stage renal disease: Secondary | ICD-10-CM | POA: Diagnosis not present

## 2021-05-10 DIAGNOSIS — Z992 Dependence on renal dialysis: Secondary | ICD-10-CM | POA: Diagnosis not present

## 2021-05-10 DIAGNOSIS — N269 Renal sclerosis, unspecified: Secondary | ICD-10-CM | POA: Diagnosis not present

## 2021-05-11 ENCOUNTER — Encounter: Payer: Medicare Other | Admitting: Physical Therapy

## 2021-05-12 ENCOUNTER — Other Ambulatory Visit: Payer: Self-pay

## 2021-05-12 DIAGNOSIS — D509 Iron deficiency anemia, unspecified: Secondary | ICD-10-CM | POA: Diagnosis not present

## 2021-05-12 DIAGNOSIS — N186 End stage renal disease: Secondary | ICD-10-CM | POA: Diagnosis not present

## 2021-05-12 DIAGNOSIS — R252 Cramp and spasm: Secondary | ICD-10-CM

## 2021-05-12 DIAGNOSIS — D631 Anemia in chronic kidney disease: Secondary | ICD-10-CM | POA: Diagnosis not present

## 2021-05-12 DIAGNOSIS — N2581 Secondary hyperparathyroidism of renal origin: Secondary | ICD-10-CM | POA: Diagnosis not present

## 2021-05-12 DIAGNOSIS — Z992 Dependence on renal dialysis: Secondary | ICD-10-CM | POA: Diagnosis not present

## 2021-05-13 NOTE — Progress Notes (Signed)
? ? [facility] ? ?Patient name: Katie Nunez MRN: [id number] DOB: May 19, 1955 Sex: female ? ?REASON FOR VISIT: Evaluation lower extremity pain ? ?HPI: ?Katie Nunez is a 66 y.o. female known to our service from prior AV access placement.  She currently has dialysis via a left upper arm fistula and has had excellent use of this. She does have multiple diagnoses including fibromyalgia scleroderma and Raynaud's disease.  Patient reports left lower extremity pain especially when changing positions (e.g. laying down at night to go to bed, or changing positions from seated to standing).  The patient does not have typical symptoms of claudication, ischemic rest pain, or ischemic ulceration.  She suffered a stroke recently and has been limited in her ambulation.  She is still active and able to walk. ? ?05/14/21: Patient returns to clinic to discuss persistent complaints.  Patient reports cramping discomfort in her calves.  She mostly notices this when she is changing positions.  She continues to deny typical symptoms of peripheral arterial disease including cramping pain in the calves with walking; constant burning discomfort in the balls of the feet; or ischemic ulceration.  She also describes mild access related hand ischemia symptoms.  She notices discomfort in her left hand while doing dialysis treatments.  This does not bother her much at other times. ? ?Past Medical History:  ?Diagnosis Date  ? Achalasia   ? Anxiety   ? Dysplasia of cervix, low grade (CIN 1)   ? Environmental allergies   ? "all year long" (12/27/2016)  ? ESRD (end stage renal disease) on dialysis [facility])   ? "TTS; [facility]" (12/27/2016)  ? Fibromyalgia   ? GERD (gastroesophageal reflux disease)   ? Gout   ? Hypertension   ? IBS (irritable bowel syndrome)   ? MVP (mitral valve prolapse)   ? RA (rheumatoid arthritis) ([place])   ? FOLLOWED BY DR. SHANAHAN  ? Raynaud's disease   ?  Scleroderma ([place])   ? Seasonal allergies   ? Thrombocytopenia ([place]) 07/01/2016  ? Acute fall to 13,000 07/01/16  ? Tubular adenoma 01/08/2008  ? CECUM  ? Vitamin D deficiency   ? ? ?Family History  ?Problem Relation Age of Onset  ? Hypertension Mother   ? Diabetes Mother   ? Heart disease Father   ? Hypertension Maternal Aunt   ? Diabetes Maternal Grandmother   ? Heart disease Paternal Grandfather   ? Cerebral palsy Cousin   ?     1ST COUSIN?  ? Diabetes Paternal Grandmother   ? ? ?SOCIAL HISTORY: ?Social History  ? ?Tobacco Use  ? Smoking status: Never  ? Smokeless tobacco: Never  ?Substance Use Topics  ? Alcohol use: No  ? ? ?Allergies  ?Allergen Reactions  ? Other Anaphylaxis and Other (See Comments)  ?  Do not use polyflux membrane.  Use alternate ?Other reaction(s): heart racing  ? Savella [Milnacipran Hcl] Palpitations and Other (See Comments)  ?  Unknown  ? Tape Rash and Other (See Comments)  ?  Itch- unsure if it was paper or adhesive tape  ? ? ?Current Outpatient Medications  ?Medication Sig Dispense Refill  ? ambrisentan (LETAIRIS) 5 MG tablet Take 5 mg by mouth daily.    ? aspirin EC 81 MG tablet Take 1 tablet (81 mg total) by mouth daily. Swallow whole. 30 tablet 11  ? atorvastatin (LIPITOR) 40 MG tablet Take 1 tablet (40 mg total) by mouth daily. 90 tablet 3  ?  azelastine (ASTELIN) 0.1 % nasal spray Place 1 spray into both nostrils 2 (two) times daily as needed for rhinitis. Use in each nostril as directed 30 mL 12  ? calcium acetate (PHOSLO) 667 MG capsule Take by mouth.    ? camphor-menthol (SARNA) lotion Apply topically 2 (two) times daily. (Patient taking differently: Apply topically as needed.) 222 mL 0  ? clonazePAM (KLONOPIN) 0.5 MG tablet Take 1 tablet (0.5 mg total) by mouth 2 (two) times daily as needed for anxiety. 30 tablet 0  ? cyclobenzaprine (FLEXERIL) 5 MG tablet Take 1 tablet (5 mg total) by mouth every 8 (eight) hours as needed for muscle spasms. 60 tablet 4  ? docusate sodium (COLACE)  100 MG capsule Take 2 capsules (200 mg total) by mouth daily. (Patient taking differently: Take 200 mg by mouth as needed.) 60 capsule 0  ? famotidine (PEPCID) 20 MG tablet Take 1 tablet (20 mg total) by mouth daily as needed for heartburn or indigestion. (Patient taking differently: Take 40 mg by mouth 2 (two) times daily.) 30 tablet 0  ? gabapentin (NEURONTIN) 100 MG capsule Take 1 capsule (100 mg total) by mouth 2 (two) times daily as needed. 60 capsule 0  ? hydrocerin (EUCERIN) CREA Apply 1 application topically 2 (two) times daily. To dry skin--avoid IV site 454 g 0  ? hydrOXYzine (ATARAX/VISTARIL) 25 MG tablet Take 25 mg by mouth every 8 (eight) hours as needed for itching.     ? levocetirizine (XYZAL) 5 MG tablet SMARTSIG:1 Tablet(s) By Mouth Every Evening    ? lidocaine-prilocaine (EMLA) cream Apply 1 application topically as needed. Apply to toes as needed for pain 30 g 3  ? lubiprostone (AMITIZA) 8 MCG capsule Take 8 mcg by mouth 2 (two) times daily with a meal.    ? multivitamin (RENA-VIT) TABS tablet Take 1 tablet by mouth daily at 6 (six) AM. 30 tablet 0  ? oxymetazoline (AFRIN) 0.05 % nasal spray Place 1 spray into both nostrils 2 (two) times daily as needed for congestion. 30 mL 0  ? Selexipag (UPTRAVI) 800 MCG TABS Take 1 tablet (800 mcg total) by mouth in the morning and at bedtime. 60 tablet 0  ? sodium chloride (OCEAN) 0.65 % SOLN nasal spray Place 1 spray into both nostrils 5 (five) times daily. At least 5 times a day  0  ? hydrocortisone (ANUSOL-HC) 2.5 % rectal cream Place rectally 2 (two) times daily as needed for hemorrhoids or anal itching. (Patient not taking: Reported on 05/14/2021) 30 g 0  ? menthol-cetylpyridinium (CEPACOL) 3 MG lozenge Take 1 lozenge (3 mg total) by mouth as needed for sore throat. (Patient not taking: Reported on 05/14/2021) 100 tablet 12  ? pentoxifylline (TRENTAL) 400 MG CR tablet Take 1 tablet (400 mg total) by mouth daily. (Patient not taking: Reported on 05/14/2021) 30  tablet 0  ? ?No current facility-administered medications for this visit.  ? ? ?REVIEW OF SYSTEMS:  ?'[X]'$  denotes positive finding, '[ ]'$  denotes negative finding ?Cardiac  Comments:  ?Chest pain or chest pressure:    ?Shortness of breath upon exertion:    ?Short of breath when lying flat:    ?Irregular heart rhythm:    ?    ?Vascular    ?Pain in calf, thigh, or hip brought on by ambulation:    ?Pain in feet at night that wakes you up from your sleep:     ?Blood clot in your veins:    ?Leg swelling:  x   ?    ? ? ?  PHYSICAL EXAM: ?Vitals:  ? 05/14/21 1131  ?BP: (!) 97/59  ?Pulse: 82  ?Resp: 14  ?Temp: 97.9 ?F (36.6 ?C)  ?TempSrc: Temporal  ?SpO2: 100%  ?Weight: 130 lb (59 kg)  ?Height: '5\' 5"'$  (1.651 m)  ? ? ? ?GENERAL: The patient is a well-nourished female, in no acute distress. The vital signs are documented above. ?CARDIOVASCULAR: Palpable radial pulses bilaterally.  Excellent thrill in her left upper arm AV fistula.  1+ PT pulse bilaterally. ?PULMONARY: There is good air exchange  ?MUSCULOSKELETAL: There are no major deformities or cyanosis. ?NEUROLOGIC: No focal weakness or paresthesias are detected. ?SKIN: There are no ulcers or rashes noted.  Thickening and leathery changes from her calves down onto her feet and toes ?PSYCHIATRIC: The patient has a normal affect. ? ?DATA:  ?+-------+-----------+-----------+------------+------------+  ?ABI/TBIToday's ABIToday's TBIPrevious ABIPrevious TBI  ?+-------+-----------+-----------+------------+------------+  ?Right  1.12       absent     1.15        0.63          ?+-------+-----------+-----------+------------+------------+  ?Left   1.28       absent     1.17        0.65          ?+-------+-----------+-----------+------------+------------+  ? ? ?MEDICAL ISSUES: ?66 year old woman with end-stage renal disease and lower extremity pain of unclear etiology. ? ?No evidence of hemodynamically significant peripheral arterial disease on clinical exam or  noninvasive testing today.  She does likely have small artery disease in her feet based on her absent toe pressures.  Thankfully she does not have any limb threatening features.  I counseled conservative management only for the access

## 2021-05-14 ENCOUNTER — Ambulatory Visit: Payer: Medicare Other | Admitting: Vascular Surgery

## 2021-05-14 ENCOUNTER — Encounter: Payer: Self-pay | Admitting: Vascular Surgery

## 2021-05-14 ENCOUNTER — Ambulatory Visit (HOSPITAL_COMMUNITY)
Admission: RE | Admit: 2021-05-14 | Discharge: 2021-05-14 | Disposition: A | Discharge status: Home / Self Care | Payer: Medicare Other | Source: Ambulatory Visit | Attending: Vascular Surgery | Admitting: Vascular Surgery

## 2021-05-14 VITALS — BP 97/59 | HR 82 | Temp 97.9°F | Resp 14 | Ht 65.0 in | Wt 130.0 lb

## 2021-05-14 DIAGNOSIS — N186 End stage renal disease: Secondary | ICD-10-CM | POA: Diagnosis not present

## 2021-05-14 DIAGNOSIS — D509 Iron deficiency anemia, unspecified: Secondary | ICD-10-CM | POA: Diagnosis not present

## 2021-05-14 DIAGNOSIS — N2581 Secondary hyperparathyroidism of renal origin: Secondary | ICD-10-CM | POA: Diagnosis not present

## 2021-05-14 DIAGNOSIS — R252 Cramp and spasm: Secondary | ICD-10-CM | POA: Diagnosis not present

## 2021-05-14 DIAGNOSIS — Z992 Dependence on renal dialysis: Secondary | ICD-10-CM | POA: Diagnosis not present

## 2021-05-14 DIAGNOSIS — D631 Anemia in chronic kidney disease: Secondary | ICD-10-CM | POA: Diagnosis not present

## 2021-05-14 DIAGNOSIS — I739 Peripheral vascular disease, unspecified: Secondary | ICD-10-CM | POA: Diagnosis not present

## 2021-05-16 DIAGNOSIS — Z992 Dependence on renal dialysis: Secondary | ICD-10-CM | POA: Diagnosis not present

## 2021-05-16 DIAGNOSIS — N186 End stage renal disease: Secondary | ICD-10-CM | POA: Diagnosis not present

## 2021-05-16 DIAGNOSIS — D509 Iron deficiency anemia, unspecified: Secondary | ICD-10-CM | POA: Diagnosis not present

## 2021-05-16 DIAGNOSIS — N2581 Secondary hyperparathyroidism of renal origin: Secondary | ICD-10-CM | POA: Diagnosis not present

## 2021-05-16 DIAGNOSIS — D631 Anemia in chronic kidney disease: Secondary | ICD-10-CM | POA: Diagnosis not present

## 2021-05-19 DIAGNOSIS — I270 Primary pulmonary hypertension: Secondary | ICD-10-CM | POA: Diagnosis not present

## 2021-05-19 DIAGNOSIS — N2581 Secondary hyperparathyroidism of renal origin: Secondary | ICD-10-CM | POA: Diagnosis not present

## 2021-05-19 DIAGNOSIS — R0609 Other forms of dyspnea: Secondary | ICD-10-CM | POA: Diagnosis not present

## 2021-05-19 DIAGNOSIS — Z992 Dependence on renal dialysis: Secondary | ICD-10-CM | POA: Diagnosis not present

## 2021-05-19 DIAGNOSIS — N186 End stage renal disease: Secondary | ICD-10-CM | POA: Diagnosis not present

## 2021-05-19 DIAGNOSIS — D631 Anemia in chronic kidney disease: Secondary | ICD-10-CM | POA: Diagnosis not present

## 2021-05-19 DIAGNOSIS — D509 Iron deficiency anemia, unspecified: Secondary | ICD-10-CM | POA: Diagnosis not present

## 2021-05-20 DIAGNOSIS — D61818 Other pancytopenia: Secondary | ICD-10-CM | POA: Diagnosis not present

## 2021-05-20 DIAGNOSIS — R0602 Shortness of breath: Secondary | ICD-10-CM | POA: Diagnosis not present

## 2021-05-20 DIAGNOSIS — I50812 Chronic right heart failure: Secondary | ICD-10-CM | POA: Diagnosis not present

## 2021-05-20 DIAGNOSIS — I2721 Secondary pulmonary arterial hypertension: Secondary | ICD-10-CM | POA: Diagnosis not present

## 2021-05-20 DIAGNOSIS — I110 Hypertensive heart disease with heart failure: Secondary | ICD-10-CM | POA: Diagnosis not present

## 2021-05-20 DIAGNOSIS — Z79899 Other long term (current) drug therapy: Secondary | ICD-10-CM | POA: Diagnosis not present

## 2021-05-20 DIAGNOSIS — M359 Systemic involvement of connective tissue, unspecified: Secondary | ICD-10-CM | POA: Diagnosis not present

## 2021-05-20 DIAGNOSIS — R0609 Other forms of dyspnea: Secondary | ICD-10-CM | POA: Diagnosis not present

## 2021-05-20 DIAGNOSIS — Z8673 Personal history of transient ischemic attack (TIA), and cerebral infarction without residual deficits: Secondary | ICD-10-CM | POA: Diagnosis not present

## 2021-05-20 DIAGNOSIS — K5900 Constipation, unspecified: Secondary | ICD-10-CM | POA: Diagnosis not present

## 2021-05-20 DIAGNOSIS — N186 End stage renal disease: Secondary | ICD-10-CM | POA: Diagnosis not present

## 2021-05-20 DIAGNOSIS — D631 Anemia in chronic kidney disease: Secondary | ICD-10-CM | POA: Diagnosis not present

## 2021-05-21 DIAGNOSIS — D631 Anemia in chronic kidney disease: Secondary | ICD-10-CM | POA: Diagnosis not present

## 2021-05-21 DIAGNOSIS — Z992 Dependence on renal dialysis: Secondary | ICD-10-CM | POA: Diagnosis not present

## 2021-05-21 DIAGNOSIS — N186 End stage renal disease: Secondary | ICD-10-CM | POA: Diagnosis not present

## 2021-05-21 DIAGNOSIS — N2581 Secondary hyperparathyroidism of renal origin: Secondary | ICD-10-CM | POA: Diagnosis not present

## 2021-05-21 DIAGNOSIS — D509 Iron deficiency anemia, unspecified: Secondary | ICD-10-CM | POA: Diagnosis not present

## 2021-05-23 DIAGNOSIS — D631 Anemia in chronic kidney disease: Secondary | ICD-10-CM | POA: Diagnosis not present

## 2021-05-23 DIAGNOSIS — N186 End stage renal disease: Secondary | ICD-10-CM | POA: Diagnosis not present

## 2021-05-23 DIAGNOSIS — D509 Iron deficiency anemia, unspecified: Secondary | ICD-10-CM | POA: Diagnosis not present

## 2021-05-23 DIAGNOSIS — N2581 Secondary hyperparathyroidism of renal origin: Secondary | ICD-10-CM | POA: Diagnosis not present

## 2021-05-23 DIAGNOSIS — Z992 Dependence on renal dialysis: Secondary | ICD-10-CM | POA: Diagnosis not present

## 2021-05-26 DIAGNOSIS — N186 End stage renal disease: Secondary | ICD-10-CM | POA: Diagnosis not present

## 2021-05-26 DIAGNOSIS — D509 Iron deficiency anemia, unspecified: Secondary | ICD-10-CM | POA: Diagnosis not present

## 2021-05-26 DIAGNOSIS — N2581 Secondary hyperparathyroidism of renal origin: Secondary | ICD-10-CM | POA: Diagnosis not present

## 2021-05-26 DIAGNOSIS — Z992 Dependence on renal dialysis: Secondary | ICD-10-CM | POA: Diagnosis not present

## 2021-05-26 DIAGNOSIS — D631 Anemia in chronic kidney disease: Secondary | ICD-10-CM | POA: Diagnosis not present

## 2021-05-28 DIAGNOSIS — Z992 Dependence on renal dialysis: Secondary | ICD-10-CM | POA: Diagnosis not present

## 2021-05-28 DIAGNOSIS — D509 Iron deficiency anemia, unspecified: Secondary | ICD-10-CM | POA: Diagnosis not present

## 2021-05-28 DIAGNOSIS — N2581 Secondary hyperparathyroidism of renal origin: Secondary | ICD-10-CM | POA: Diagnosis not present

## 2021-05-28 DIAGNOSIS — D631 Anemia in chronic kidney disease: Secondary | ICD-10-CM | POA: Diagnosis not present

## 2021-05-28 DIAGNOSIS — N186 End stage renal disease: Secondary | ICD-10-CM | POA: Diagnosis not present

## 2021-05-30 DIAGNOSIS — D631 Anemia in chronic kidney disease: Secondary | ICD-10-CM | POA: Diagnosis not present

## 2021-05-30 DIAGNOSIS — Z992 Dependence on renal dialysis: Secondary | ICD-10-CM | POA: Diagnosis not present

## 2021-05-30 DIAGNOSIS — N186 End stage renal disease: Secondary | ICD-10-CM | POA: Diagnosis not present

## 2021-05-30 DIAGNOSIS — N2581 Secondary hyperparathyroidism of renal origin: Secondary | ICD-10-CM | POA: Diagnosis not present

## 2021-05-30 DIAGNOSIS — D509 Iron deficiency anemia, unspecified: Secondary | ICD-10-CM | POA: Diagnosis not present

## 2021-06-01 ENCOUNTER — Encounter: Payer: Self-pay | Admitting: Physical Therapy

## 2021-06-01 ENCOUNTER — Ambulatory Visit: Payer: Medicare Other | Attending: Family Medicine | Admitting: Physical Therapy

## 2021-06-01 DIAGNOSIS — M6281 Muscle weakness (generalized): Secondary | ICD-10-CM | POA: Diagnosis not present

## 2021-06-01 DIAGNOSIS — R278 Other lack of coordination: Secondary | ICD-10-CM | POA: Insufficient documentation

## 2021-06-01 NOTE — Therapy (Signed)
[facility] @ [place] [place], [place], [address] Phone: [phone]   Fax:  [phone]  Physical Therapy Treatment  Patient Details  Name: Katie Nunez MRN: [id number] Date of Birth: [birth year] Referring Provider (PT): Dr. [name]   Encounter Date: 06/01/2021   PT End of Session - 06/01/21 0854     Visit Number 11    Date for PT Re-Evaluation 07/08/21    Authorization Type [place] - Visit Number 11    Authorization - Number of Visits 20    PT Start Time 0845    PT Stop Time 0923    PT Time Calculation (min) 38 min    Activity Tolerance Patient tolerated treatment well    Behavior During Therapy [facility] for tasks assessed/performed             Past Medical History:  Diagnosis Date   Achalasia    Anxiety    Dysplasia of cervix, low grade (CIN 1)    Environmental allergies    "all year long" (12/27/2016)   ESRD (end stage renal disease) on dialysis ([place])    "TTS; [facility]" (12/27/2016)   Fibromyalgia    GERD (gastroesophageal reflux disease)    Gout    Hypertension    IBS (irritable bowel syndrome)    MVP (mitral valve prolapse)    RA (rheumatoid arthritis) ([place])    FOLLOWED BY DR. SHANAHAN   Raynaud's disease    Scleroderma ([place])    Seasonal allergies    Thrombocytopenia ([place]) 07/01/2016   Acute fall to 13,000 07/01/16   Tubular adenoma 01/08/2008   CECUM   Vitamin D deficiency     Past Surgical History:  Procedure Laterality Date   ANKLE FRACTURE SURGERY Right    AV FISTULA PLACEMENT Left 06/28/2016   Procedure: left arm ARTERIOVENOUS (AV) FISTULA CREATION;  Surgeon: Rosetta Posner, MD;  Location: [facility] OR;  Service: Vascular;  Laterality: Left;   [place] Left 09/27/2016   Procedure: LEFT UPPER ARM CEPHALIC VEIN TRANSPOSITION;  Surgeon: Rosetta Posner, MD;  Location: [facility] OR;  Service: Vascular;  Laterality: Left;   BREAST BIOPSY     "? side"    [place]     COLONOSCOPY W/ BIOPSIES  01/08/2008   INSERTION OF DIALYSIS CATHETER Right 06/28/2016   Procedure: INSERTION OF DIALYSIS CATHETER, right internal jugular;  Surgeon: Rosetta Posner, MD;  Location: [facility];  Service: Vascular;  Laterality: Right;   MYOMECTOMY     NASAL ENDOSCOPY WITH EPISTAXIS CONTROL N/A 12/29/2019   Procedure: NASAL ENDOSCOPY WITH EPISTAXIS CONTROL;  Surgeon: Leta Baptist, MD;  Location: [facility];  Service: ENT;  Laterality: N/A;   PELVIC LAPAROSCOPY  2011   superficial thrombophlebitis Left 07-2014    There were no vitals filed for this visit.   Subjective Assessment - 06/01/21 0855     Subjective At the end of the bowel movement I am not able to push the rest out. When I have the urge to have a bowel movement I will wait till I have a true urge. I have 3 bowel movements per day and are not all complete. At night I wait instead of getting up.    Patient Stated Goals learn how to expel the stool without putting had no tailbone to push the stool out    Currently in Pain? No/denies  [facility] Adult PT Treatment/Exercise - 06/01/21 0001       Self-Care   Self-Care Other Self-Care Comments    Other Self-Care Comments  educated patient on how the stool movement through the intestines      Knee/Hip Exercises: Standing   Hip Abduction Stengthening;Right;Left;1 set;10 reps    Abduction Limitations with pelvic floor contraction , holding onto the counter, therapist vc to lead with the heel    Hip Extension Stengthening;Right;Left;1 set;10 reps    Extension Limitations verbal cues to contract the gluteal      Manual Therapy   Manual Therapy Soft tissue mobilization;Myofascial release    Soft tissue mobilization circular massage to the abdomen to improve peristalic motion of the intestines    Myofascial Release tissue rolling of the abdomen, release of the upper abdomen, release the  mesenteric root, release of the sac of douglas                     PT Education - 06/01/21 0924     Education Details sent patient you tube video on abdominal massage to assist the stool to go through the intestines.    Person(s) Educated Patient    Methods Explanation;Demonstration;Other (comment)   you tube video   Comprehension Verbalized understanding              PT Short Term Goals - 04/27/21 1408       PT SHORT TERM GOAL #1   Title Patient will demonstrate independent use of home exercise program including pelvic floor exercises    Time 4    Period Weeks    Status Achieved    Target Date 03/16/21               PT Long Term Goals - 06/01/21 0929       PT LONG TERM GOAL #1   Title Patient will demonstrate advanced HEP for pelvic floor coordination and strength    Baseline still learning exercises to strengthening the pelvic floor    Time 8    Period Weeks    Status On-going    Target Date 04/13/21      PT LONG TERM GOAL #2   Title Patient will be able to have a bowel movement without straining or pushing by the tailbone to fully empty her stool    Baseline patient does not immediately run to the bathroom and is 55% better.    Time 8    Period Weeks    Status On-going    Target Date 04/13/21      PT LONG TERM GOAL #3   Title Patient will report her stool leakage has improved >/= 75% due to improved pelvic floor coordination    Baseline improved by 70% better    Time 8    Period Weeks    Status On-going    Target Date 04/13/21      PT LONG TERM GOAL #4   Title Patient will be able to demonstrate correct  toileting using the squatty potty to push stool out and not having to assist    Time 8    Period Weeks    Status Achieved    Target Date 04/13/21                   Plan - 06/01/21 3299     Clinical Impression Statement Patient is able to wait till she has the true feeling of the urge to have a bowel  movement. Since she has  been doing that, she is having larger bowel movements 3 times per day. She still has difficulty getting the last bit of the stool out at the end. Patient has tightness in the upper abdominal area and fascial restrictions. Patient has difficulty with hip abduction due to keeping her hip slightly flexed and not contracting the gluteals fully. Patient will benefit from skilled therapy to improve stool emptying and reduce fecal leakage.    Personal Factors and Comorbidities Comorbidity 3+;Time since onset of injury/illness/exacerbation;Past/Current Experience;Fitness    Comorbidities Stroke 12/2019; End stage renal disease; Raynauds disease; Sclerodema; RA; Pelvic laproscopy for myomectomy 2011; Dialyssis; C-section    Examination-Activity Limitations Continence;Toileting    Stability/Clinical Decision Making Stable/Uncomplicated    Rehab Potential Good    PT Frequency 1x / week    PT Duration 12 weeks    PT Treatment/Interventions ADLs/Self Care Home Management;Biofeedback;Therapeutic activities;Therapeutic exercise;Neuromuscular re-education;Patient/family education;Manual techniques;Spinal Manipulations    PT Next Visit Plan work on hip strength and pelvic strength, update HEP with new exercises; check on fecal leakage; see if she is able to do the abdominal massage alright    PT Home Exercise Plan Access Code: [email or web address]    Consulted and Agree with Plan of Care Patient             Patient will benefit from skilled therapeutic intervention in order to improve the following deficits and impairments:  Decreased range of motion  Visit Diagnosis: Muscle weakness (generalized)  Other lack of coordination     Problem List Patient Active Problem List   Diagnosis Date Noted   Neck pain 10/30/2020   Knee locking, left 10/05/2020   Abnormality of gait 02/25/2020   Protein-calorie malnutrition, severe 01/19/2020   Epistaxis    Sleep disturbance    Slow transit constipation    Hemorrhoids     Chronic systolic congestive heart failure ([place])    ESRD on dialysis ([place])    Right hemiparesis ([place])    Pressure injury of skin 12/20/2019   Intraparenchymal hemorrhage of brain ([place]) 12/18/2019   ICH (intracerebral hemorrhage) ([place]) 12/12/2019   Viral disease 08/22/2019   Chronic right-sided heart failure ([place]) 07/09/2019   Supraventricular tachycardia ([place]) 07/09/2019   Other cirrhosis of liver ([place]) 06/27/2019   Heart failure ([place]) 02/21/2019   Heart palpitations 08/14/2018   Other fluid overload 07/27/2018   Encounter for long-term (current) use of other medications 01/18/2018   Hypothyroidism 01/18/2018   Myalgia and myositis 01/18/2018   Chronic nephritis 01/18/2018   Other long term (current) drug therapy 01/18/2018   Sleep apnea 01/18/2018   Unspecified persistent mental disorders due to conditions classified elsewhere 01/18/2018   Venous reflux 01/18/2018   Vitamin B12 deficiency 01/18/2018   Vitamin D deficiency 01/18/2018   Obstructive lung disease ([place]) 12/30/2017   Other pruritus 12/27/2017   Eruption cyst 12/19/2017   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 ([place]) 11/28/2017   Chronic cough 11/11/2017   Pulmonary hypertension ([place]) 11/11/2017   Pulmonary arterial hypertension ([place]) 10/07/2017   Acute ITP ([place]) 01/05/2017   ESRD (end stage renal disease) ([place]) 12/28/2016   Encounter for removal of sutures 09/09/2016   Coagulation defect, unspecified ([place]) 09/01/2016   Underimmunization status 08/05/2016   Hemolytic anemia ([place]) 07/26/2016   Epistaxis, recurrent 07/26/2016   Unspecified protein-calorie malnutrition ([place]) 07/13/2016   Aftercare including intermittent dialysis ([place]) 07/07/2016   Anemia in chronic kidney disease 07/07/2016   Hypokalemia 07/07/2016   Iron deficiency anemia,  unspecified 07/07/2016   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   Nonrheumatic mitral (valve) prolapse 07/07/2016   Other irritable bowel syndrome  07/07/2016   Other secondary thrombocytopenia 07/07/2016   Secondary hyperparathyroidism of renal origin ([place]) 07/07/2016   Thrombocytopenia ([place]) 07/01/2016   ARF (acute renal failure) ([place]) 06/25/2016   Anxiety 06/25/2016   Bilateral carpal tunnel syndrome 10/22/2015   Chronic gout without tophus 10/22/2015   Chronic nonintractable headache 10/08/2015   Fibroid uterus 01/03/2012   H/O vitamin D deficiency 01/03/2012   Post-menopausal 01/03/2012   Hereditary and idiopathic peripheral neuropathy 11/19/2011   Intestinal malabsorption 00/17/4944   Lichen planus 96/75/9163   Low back pain 11/19/2011   Diffuse spasm of esophagus 11/11/2011   ESR raised 11/11/2011   Postinflammatory pulmonary fibrosis ([place]) 11/11/2011   Scleroderma ([place]) 11/16/2010   Rheumatoid arthritis ([place]) 11/16/2010   Raynaud's disease 11/16/2010   Symptomatic menopausal or female climacteric states 11/16/2010    Earlie Counts, PT 06/01/21 9:30 AM   [facility] @ [address] [place], [place], [address] Phone: [phone]   Fax:  [phone]  Name: Katie Nunez MRN: [id number] Date of Birth: March 14, 1955

## 2021-06-02 DIAGNOSIS — N2581 Secondary hyperparathyroidism of renal origin: Secondary | ICD-10-CM | POA: Diagnosis not present

## 2021-06-02 DIAGNOSIS — D631 Anemia in chronic kidney disease: Secondary | ICD-10-CM | POA: Diagnosis not present

## 2021-06-02 DIAGNOSIS — N186 End stage renal disease: Secondary | ICD-10-CM | POA: Diagnosis not present

## 2021-06-02 DIAGNOSIS — Z992 Dependence on renal dialysis: Secondary | ICD-10-CM | POA: Diagnosis not present

## 2021-06-02 DIAGNOSIS — D509 Iron deficiency anemia, unspecified: Secondary | ICD-10-CM | POA: Diagnosis not present

## 2021-06-04 DIAGNOSIS — N2581 Secondary hyperparathyroidism of renal origin: Secondary | ICD-10-CM | POA: Diagnosis not present

## 2021-06-04 DIAGNOSIS — N186 End stage renal disease: Secondary | ICD-10-CM | POA: Diagnosis not present

## 2021-06-04 DIAGNOSIS — D509 Iron deficiency anemia, unspecified: Secondary | ICD-10-CM | POA: Diagnosis not present

## 2021-06-04 DIAGNOSIS — D631 Anemia in chronic kidney disease: Secondary | ICD-10-CM | POA: Diagnosis not present

## 2021-06-04 DIAGNOSIS — Z992 Dependence on renal dialysis: Secondary | ICD-10-CM | POA: Diagnosis not present

## 2021-06-06 DIAGNOSIS — D509 Iron deficiency anemia, unspecified: Secondary | ICD-10-CM | POA: Diagnosis not present

## 2021-06-06 DIAGNOSIS — D631 Anemia in chronic kidney disease: Secondary | ICD-10-CM | POA: Diagnosis not present

## 2021-06-06 DIAGNOSIS — N2581 Secondary hyperparathyroidism of renal origin: Secondary | ICD-10-CM | POA: Diagnosis not present

## 2021-06-06 DIAGNOSIS — Z992 Dependence on renal dialysis: Secondary | ICD-10-CM | POA: Diagnosis not present

## 2021-06-06 DIAGNOSIS — N186 End stage renal disease: Secondary | ICD-10-CM | POA: Diagnosis not present

## 2021-06-09 DIAGNOSIS — D631 Anemia in chronic kidney disease: Secondary | ICD-10-CM | POA: Diagnosis not present

## 2021-06-09 DIAGNOSIS — Z992 Dependence on renal dialysis: Secondary | ICD-10-CM | POA: Diagnosis not present

## 2021-06-09 DIAGNOSIS — N186 End stage renal disease: Secondary | ICD-10-CM | POA: Diagnosis not present

## 2021-06-09 DIAGNOSIS — N2581 Secondary hyperparathyroidism of renal origin: Secondary | ICD-10-CM | POA: Diagnosis not present

## 2021-06-09 DIAGNOSIS — D509 Iron deficiency anemia, unspecified: Secondary | ICD-10-CM | POA: Diagnosis not present

## 2021-06-10 ENCOUNTER — Encounter: Payer: Self-pay | Admitting: Physical Therapy

## 2021-06-10 ENCOUNTER — Ambulatory Visit: Payer: Medicare Other | Admitting: Neurology

## 2021-06-10 ENCOUNTER — Encounter: Payer: Self-pay | Admitting: Neurology

## 2021-06-10 ENCOUNTER — Ambulatory Visit: Payer: Medicare Other | Admitting: Physical Therapy

## 2021-06-10 VITALS — BP 90/52 | HR 78 | Ht 65.0 in | Wt 134.8 lb

## 2021-06-10 DIAGNOSIS — R7989 Other specified abnormal findings of blood chemistry: Secondary | ICD-10-CM | POA: Diagnosis not present

## 2021-06-10 DIAGNOSIS — R278 Other lack of coordination: Secondary | ICD-10-CM | POA: Diagnosis not present

## 2021-06-10 DIAGNOSIS — N269 Renal sclerosis, unspecified: Secondary | ICD-10-CM | POA: Diagnosis not present

## 2021-06-10 DIAGNOSIS — N186 End stage renal disease: Secondary | ICD-10-CM | POA: Diagnosis not present

## 2021-06-10 DIAGNOSIS — R252 Cramp and spasm: Secondary | ICD-10-CM

## 2021-06-10 DIAGNOSIS — Z8673 Personal history of transient ischemic attack (TIA), and cerebral infarction without residual deficits: Secondary | ICD-10-CM | POA: Diagnosis not present

## 2021-06-10 DIAGNOSIS — E7800 Pure hypercholesterolemia, unspecified: Secondary | ICD-10-CM | POA: Diagnosis not present

## 2021-06-10 DIAGNOSIS — M6281 Muscle weakness (generalized): Secondary | ICD-10-CM | POA: Diagnosis not present

## 2021-06-10 DIAGNOSIS — Z992 Dependence on renal dialysis: Secondary | ICD-10-CM | POA: Diagnosis not present

## 2021-06-10 DIAGNOSIS — R799 Abnormal finding of blood chemistry, unspecified: Secondary | ICD-10-CM | POA: Diagnosis not present

## 2021-06-10 MED ORDER — COENZYME Q10 30 MG PO CAPS: 200.0000 mg | ORAL_CAPSULE | Freq: Three times a day (TID) | ORAL | 1 refills | Status: DC

## 2021-06-10 MED ORDER — GABAPENTIN 100 MG PO CAPS: 100.0000 mg | ORAL_CAPSULE | Freq: Two times a day (BID) | ORAL | 0 refills | Status: DC | PRN

## 2021-06-10 NOTE — Progress Notes (Signed)
[facility] [address] [place]. [place] [address] [phone]       STROKE FOLLOW UP NOTE  [name] Date of Birth:  [birth year] Medical Record Number:  [id number]   Reason for Referral:  stroke follow up    SUBJECTIVE:   CHIEF COMPLAINT:  Chief Complaint  Patient presents with   Follow-up    Rm 17. 6 mo f/u.     HPI:  Update 06/10/2021 ; she returns for follow-up after last visit 6 months ago.  She is accompanied by husband.  Patient is doing well and had no recurrent stroke or TIA symptoms.  She states her blood pressure is in fact been quite low and today is 90/52.  She had cardiac ablation and since then both the heart rate and blood pressures have been quite low.  She remains on aspirin she is tolerating well without bruising or bleeding.  She she continues to have mild right-sided weakness and gait difficulties but does fairly well and uses a cane only for long distances.  She has had no falls or injuries.  She however complains of increased cramps in the legs particularly at night.  She does take gabapentin 100 twice daily but its not helping as much.  She has not tried higher doses yet.  She has Trental listed as a medication for peripheral vascular disease but has not been taking it and is not sure she needs it.  Advised her to discuss this with her vascular surgeon.  She has not had lipid profile checked for quite some time. Update 12/02/2020 JM: Returns for 73-monthstroke follow-up accompanied by her husband  Overall stable since prior visit -denies new stroke/TIA symptoms Residual right spastic hemiparesis - reports stable since prior visit. Some pain RLE distally. She questions possible restart of PT. Occasional use of Flexeril and gabapentin Continues to use a cane for ambulation -denies any recent falls Does report R>L foot numbness typically worse at night - this has been affecting her gait. She does have known scleroderma and  questions if these symptoms could be part of that Currently working with PT on right sided neck/shoulder pain with some improvement - followed by orthopedics - considering use of cortisone injection per patient. Chronic issue but worsened post stroke  Compliant on aspirin - denies side effects Does not consistently take atorvastatin - she did not feel this was needed. Denies side effects.  Blood pressure today 98/68 -routinely followed by nephrology and cardiology for blood pressure management  Routinely follows with GI for hepatic cirrhosis - reports recently dx'd with esophageal and rectal varices - has f/u 11/30 to further discuss [facility]  No further concerns at this time     History provided for reference purposes only Update 08/04/2020 Dr. SLeonie ManShe returns for follow-up after last visit with JJanett Billowour nurse practitioner 5 months ago.  She is doing well without recurrent stroke or TIA symptoms.  She continues to have right-sided weakness with is able to walk with a cane and has finished outpatient physical and Occupational Therapy.  She still has mild right foot drop.  She has had no falls or injuries.  She has not started taking aspirin or Eliquis and has questions about her treatment options.  She had a follow-up CT scan of the brain done on 03/25/2020 which showed satisfactory resolution of the left frontal parenchymal hematoma without any acute abnormalities.  She continues to  have low blood pressure with the systolic in the 00T ever since her diagnosis and treatment for pulmonary hypertension.  Today it is 80/57 but she denies having significant symptoms of orthostasis, tiredness and fatigue.  Initial visit 02/20/2020 JM: Ms. Gruber is being seen for hospital follow-up accompanied by her husband. She was discharged home from CIR on 01/23/2020. She has been doing well since discharge without new stroke/TIA symptoms and  reports residual right-sided weakness and gait impairment with continued improvement.  Currently working with neuro rehab PT/OT.  She does report increased tone in RUE muscle tightness which is typically worse at night.  She was using Flexeril during CIR with benefit and questions if this could be restarted.  Ambulates with cane and denies any recent falls.  She is not using assistive device PTA but was using rolling walker initially at discharge.  She remains on atorvastatin 40 mg daily without myalgias.  Blood pressure today 133/72.  BP routinely monitored at dialysis.  No further concerns at this time.  Stroke admission 12/12/2019 Ms. GALIA RAHM is a 66 y.o. female with history of ESRD on HD TTS, fibromyalgia, hypertension, mixed connective tissue disorder, scleroderma, pulmonaryhypertension, atrial fibrillation, cirrhosis-on Coumadin for portal vein thrombosis and atrial fibrillation who presented to [facility] ED on 12/12/2019 with R sided weakness. Personally reviewed hospitalization pertinent progress notes, lab work and imaging with summary provided. Evaluated by Dr. Erlinda Hong with stroke work-up revealing left frontal IPH likely secondary to warfarin coagulopathy. Warfarin was reversed with vitamin K and Kcentra with INR of 1.7 on admission and recommended follow-up outpatient to consider resuming warfarin or consider Eliquis once ICH resolves. History of HTN stable. LDL 79 and recommend consideration of statin at discharge for follow-up in setting of hemorrhage. No history or evidence of DM with A1c 5.2. Other stroke risk factors include ESRD on HD and OSA. Other active problems include anemia of ESRD, metabolic bone disease, pulmonary hypertension, RA, scleroderma and fibromyalgia. Residual deficit of right hemiplegia. Evaluated by therapies and discharged to CIR on 12/18/2019 for ongoing therapy needs.  Stroke:   L frontal IPH d/t warfarin coagulopathy  CT head acute L frontal IPH w/ mild edema Repeat CT  head stable L frontal IPH Repeat CT head unchanged L frontal IPH MRI  Subcortical L frontal white matter IPH w/ rim edema. few remote B hemorrhages upper cerebrum. MRA  Unremarkable  2D Echo EF 65 to 70% LDL 79 HgbA1c 5.2 VTE prophylaxis - SCDs  warfarin daily prior to admission, now on No antithrombotic given IPH Therapy recommendations:  CIR Disposition:  CIR       ROS:   14 system review of systems performed and negative with exception of those listed in HPI  PMH:  Past Medical History:  Diagnosis Date   Achalasia    Anxiety    Dysplasia of cervix, low grade (CIN 1)    Environmental allergies    "all year long" (12/27/2016)   ESRD (end stage renal disease) on dialysis ([place])    "TTS; [place]" (12/27/2016)   Fibromyalgia    GERD (gastroesophageal reflux disease)    Gout    Hypertension    IBS (irritable bowel syndrome)    MVP (mitral valve prolapse)    RA (rheumatoid arthritis) ([place])    FOLLOWED BY DR. SHANAHAN   Raynaud's disease    Scleroderma ([place])    Seasonal allergies    Thrombocytopenia ([place]) 07/01/2016   Acute fall to 13,000 07/01/16   Tubular adenoma 01/08/2008  CECUM   Vitamin D deficiency     PSH:  Past Surgical History:  Procedure Laterality Date   ANKLE FRACTURE SURGERY Right    AV FISTULA PLACEMENT Left 06/28/2016   Procedure: left arm ARTERIOVENOUS (AV) FISTULA CREATION;  Surgeon: Rosetta Posner, MD;  Location: [facility] OR;  Service: Vascular;  Laterality: Left;   [place] Left 09/27/2016   Procedure: LEFT UPPER ARM CEPHALIC VEIN TRANSPOSITION;  Surgeon: Rosetta Posner, MD;  Location: [facility] OR;  Service: Vascular;  Laterality: Left;   BREAST BIOPSY     "? side"   [place]     COLONOSCOPY W/ BIOPSIES  01/08/2008   INSERTION OF DIALYSIS CATHETER Right 06/28/2016   Procedure: INSERTION OF DIALYSIS CATHETER, right internal jugular;  Surgeon: Rosetta Posner, MD;  Location: [facility] OR;  Service: Vascular;   Laterality: Right;   MYOMECTOMY     NASAL ENDOSCOPY WITH EPISTAXIS CONTROL N/A 12/29/2019   Procedure: NASAL ENDOSCOPY WITH EPISTAXIS CONTROL;  Surgeon: Leta Baptist, MD;  Location: [place];  Service: ENT;  Laterality: N/A;   PELVIC LAPAROSCOPY  2011   superficial thrombophlebitis Left 07-2014    Social History:  Social History   Socioeconomic History   Marital status: Married    Spouse name: Not on file   Number of children: Not on file   Years of education: Not on file   Highest education level: Not on file  Occupational History   Not on file  Tobacco Use   Smoking status: Never   Smokeless tobacco: Never  Vaping Use   Vaping Use: Never used  Substance and Sexual Activity   Alcohol use: No   Drug use: No   Sexual activity: Not Currently    Birth control/protection: Post-menopausal  Other Topics Concern   Not on file  Social History Narrative   Lives in [place] Determinants of Health   Financial Resource Strain: Not on file  Food Insecurity: Not on file  Transportation Needs: Not on file  Physical Activity: Not on file  Stress: Not on file  Social Connections: Not on file  Intimate Partner Violence: Not on file    Family History:  Family History  Problem Relation Age of Onset   Hypertension Mother    Diabetes Mother    Heart disease Father    Hypertension Maternal Aunt    Diabetes Maternal Grandmother    Heart disease Paternal Grandfather    Cerebral palsy Cousin        1ST COUSIN?   Diabetes Paternal Grandmother     Medications:   Current Outpatient Medications on File Prior to Visit  Medication Sig Dispense Refill   ambrisentan (LETAIRIS) 5 MG tablet Take 5 mg by mouth daily.     aspirin EC 81 MG tablet Take 1 tablet (81 mg total) by mouth daily. Swallow whole. 30 tablet 11   atorvastatin (LIPITOR) 40 MG tablet Take 1 tablet (40 mg total) by mouth daily. 90 tablet 3   azelastine (ASTELIN) 0.1 % nasal spray Place 1 spray into both nostrils 2  (two) times daily as needed for rhinitis. Use in each nostril as directed 30 mL 12   calcium acetate (PHOSLO) 667 MG capsule Take by mouth.     camphor-menthol (SARNA) lotion Apply topically 2 (two) times daily. (Patient taking differently: Apply topically as needed.) 222 mL 0   clonazePAM (KLONOPIN) 0.5 MG tablet Take 1 tablet (0.5 mg  total) by mouth 2 (two) times daily as needed for anxiety. 30 tablet 0   cyclobenzaprine (FLEXERIL) 5 MG tablet Take 1 tablet (5 mg total) by mouth every 8 (eight) hours as needed for muscle spasms. 60 tablet 4   docusate sodium (COLACE) 100 MG capsule Take 2 capsules (200 mg total) by mouth daily. (Patient taking differently: Take 200 mg by mouth as needed.) 60 capsule 0   famotidine (PEPCID) 20 MG tablet Take 1 tablet (20 mg total) by mouth daily as needed for heartburn or indigestion. (Patient taking differently: Take 40 mg by mouth 2 (two) times daily.) 30 tablet 0   hydrocerin (EUCERIN) CREA Apply 1 application topically 2 (two) times daily. To dry skin--avoid IV site 454 g 0   hydrOXYzine (ATARAX/VISTARIL) 25 MG tablet Take 25 mg by mouth every 8 (eight) hours as needed for itching.      levocetirizine (XYZAL) 5 MG tablet SMARTSIG:1 Tablet(s) By Mouth Every Evening     lidocaine-prilocaine (EMLA) cream Apply 1 application topically as needed. Apply to toes as needed for pain 30 g 3   lubiprostone (AMITIZA) 8 MCG capsule Take 8 mcg by mouth 2 (two) times daily with a meal.     multivitamin (RENA-VIT) TABS tablet Take 1 tablet by mouth daily at 6 (six) AM. 30 tablet 0   oxymetazoline (AFRIN) 0.05 % nasal spray Place 1 spray into both nostrils 2 (two) times daily as needed for congestion. 30 mL 0   Selexipag (UPTRAVI) 800 MCG TABS Take 1 tablet (800 mcg total) by mouth in the morning and at bedtime. 60 tablet 0   sodium chloride (OCEAN) 0.65 % SOLN nasal spray Place 1 spray into both nostrils 5 (five) times daily. At least 5 times a day  0   No current  facility-administered medications on file prior to visit.    Allergies:   Allergies  Allergen Reactions   Other Anaphylaxis and Other (See Comments)    Do not use polyflux membrane.  Use alternate Other reaction(s): heart racing   Savella [Milnacipran Hcl] Palpitations and Other (See Comments)    Unknown   Tape Rash and Other (See Comments)    Itch- unsure if it was paper or adhesive tape      OBJECTIVE:  Physical Exam  Vitals:   06/10/21 1602  BP: (!) 90/52  Pulse: 78  Weight: 134 lb 12.8 oz (61.1 kg)  Height: '5\' 5"'$  (1.651 m)   Body mass index is 22.43 kg/m. No results found.  General: Frail very pleasant middle-aged African-American female, seated, in no evident distress Head: head normocephalic and atraumatic.   Neck: supple with no carotid or supraclavicular bruits Cardiovascular: regular rate and rhythm, no murmurs Musculoskeletal: no deformity Skin:  no rash/petichiae; +bruit and +thrill LUE fistula Vascular:  Normal pulses all extremities   Neurologic Exam Mental Status: Awake and fully alert.  Fluent speech and language. Oriented to place and time. Recent and remote memory intact. Attention span, concentration and fund of knowledge appropriate. Mood and affect appropriate.  Cranial Nerves: Pupils equal, briskly reactive to light. Extraocular movements full without nystagmus. Visual fields full to confrontation. Hearing intact. Facial sensation intact. Face, tongue, palate moves normally and symmetrically.  Motor: Normal bulk and tone and strength LUE and LLE.  RUE: 4/5 with slightly decreased hand dexterity and increased tone; RLE: 4/5 hip flexor and ankle dorsiflexion and mildly increased tone distally Sensory.: intact to touch , pinprick , position and vibratory sensation.  Coordination: Rapid  alternating movements normal in all extremities except right hand. Finger-to-nose and heel-to-shin performed accurately bilaterally Gait and Station: Arises from chair  without difficulty. Stance is normal. Gait demonstrates mild spastic hemiplegic gait with mild circumduction foot drop on the right.  With use of cane.  Tandem walk and heel toe not attempted Reflexes: 2+ RUE and RLE; 1+ LUE and LLE. Toes downgoing.         ASSESSMENT: REHANA UNCAPHER is a 66 y.o. year old female presented with right-sided weakness on 12/12/2019 with stroke work-up revealing left frontal IPH secondary to warfarin coagulopathy. Vascular risk factors include HTN, HLD, atrial fibrillation s/p ablation, portal vein thrombosis, pulmonary hypertension, rheumatoid arthritis, chronic right-sided heart failure, scleroderma and ESRD on HD.  Patient is doing well but continues to have mild spastic right hemiparesis and increase leg cramps which could be from spasticity or statin myalgias     PLAN: I had a long discussion with the patient and her husband regarding her remote intraparenchymal hemorrhage and mild residual right hemiparesis and leg cramps and answered questions.  I recommend she add co-Q10 200 mg daily to help with statin myalgias and cramps as well as increase the dose of gabapentin 200 mg in the morning and 200 at night.  She will stay on aspirin for stroke prevention and maintain aggressive risk factor modification strict control of hypertension blood pressure goal below 130/90, lipids with LDL cholesterol goal below 70 mg percent and diabetes with hemoglobin A1c goal below 6.5%.  We will check also follow-up lipid profile as well as complete metabolic panel today.  She will return for follow-up in the future in a year or call earlier if necessary.  Greater than 50% time during this 35-minute visit was spent on counseling and coordination of care about her cramps and post hemorrhage spasticity and answering questions.  Antony Contras, MD [facility] [address] [place], [place] [address]  Phone [phone] Fax [phone] Note: This  document was prepared with digital dictation and possible smart phrase technology. Any transcriptional errors that result from this process are unintentional.

## 2021-06-10 NOTE — Therapy (Signed)
[place] @ [address] [place], [place], [address] Phone: [phone]   Fax:  [phone]  Physical Therapy Treatment  Patient Details  Name: Katie Nunez MRN: [id number] Date of Birth: February 11, 1955 Referring Provider (PT): Dr. [name]   Encounter Date: 06/10/2021   PT End of Session - 06/10/21 1241     Visit Number 12    Date for PT Re-Evaluation 07/08/21    Authorization Type [place] - Visit Number 12    Authorization - Number of Visits 20    PT Start Time 3903   came late   PT Stop Time 1308    PT Time Calculation (min) 27 min    Activity Tolerance Patient tolerated treatment well    Behavior During Therapy [facility] for tasks assessed/performed             Past Medical History:  Diagnosis Date   Achalasia    Anxiety    Dysplasia of cervix, low grade (CIN 1)    Environmental allergies    "all year long" (12/27/2016)   ESRD (end stage renal disease) on dialysis ([place])    "TTS; [place]" (12/27/2016)   Fibromyalgia    GERD (gastroesophageal reflux disease)    Gout    Hypertension    IBS (irritable bowel syndrome)    MVP (mitral valve prolapse)    RA (rheumatoid arthritis) ([place])    FOLLOWED BY DR. SHANAHAN   Raynaud's disease    Scleroderma ([place])    Seasonal allergies    Thrombocytopenia ([place]) 07/01/2016   Acute fall to 13,000 07/01/16   Tubular adenoma 01/08/2008   CECUM   Vitamin D deficiency     Past Surgical History:  Procedure Laterality Date   ANKLE FRACTURE SURGERY Right    AV FISTULA PLACEMENT Left 06/28/2016   Procedure: left arm ARTERIOVENOUS (AV) FISTULA CREATION;  Surgeon: Rosetta Posner, MD;  [facility] OR;  Service: Vascular;  Laterality: Left;   [place] Left 09/27/2016   Procedure: LEFT UPPER ARM CEPHALIC VEIN TRANSPOSITION;  Surgeon: Rosetta Posner, MD;  [facility] OR;  Service: Vascular;  Laterality: Left;   BREAST BIOPSY     "?  side"   [place]     COLONOSCOPY W/ BIOPSIES  01/08/2008   INSERTION OF DIALYSIS CATHETER Right 06/28/2016   Procedure: INSERTION OF DIALYSIS CATHETER, right internal jugular;  Surgeon: Rosetta Posner, MD;  Location: [facility];  Service: Vascular;  Laterality: Right;   MYOMECTOMY     NASAL ENDOSCOPY WITH EPISTAXIS CONTROL N/A 12/29/2019   Procedure: NASAL ENDOSCOPY WITH EPISTAXIS CONTROL;  Surgeon: Leta Baptist, MD;  Location: [place];  Service: ENT;  Laterality: N/A;   PELVIC LAPAROSCOPY  2011   superficial thrombophlebitis Left 07-2014    There were no vitals filed for this visit.   Subjective Assessment - 06/10/21 1242     Subjective I am slow today and not moving fast. I am doing the abdominal massage. The initial part of the stool comes out better. I have difficulty with getting the remaider out.    Patient Stated Goals learn how to expel the stool without putting had no tailbone to push the stool out    Currently in Pain? No/denies    Multiple Pain Sites No  [facility] Adult PT Treatment/Exercise - 06/10/21 0001       Self-Care   Self-Care Other Self-Care Comments    Other Self-Care Comments  Educated patient on how to stand up after a bowel movement with pelvic circles and sway then sit back on the toilet. Educated patient on rubbing her belly in sitting on the commode to assist stool coming out. Limit on the toilet for 10 minutes.      Lumbar Exercises: Stretches   Passive Hamstring Stretch Right;Left;1 rep;30 seconds    Passive Hamstring Stretch Limitations supine with therapist assisting    Single Knee to Chest Stretch Right;Left;1 rep;30 seconds    Single Knee to Chest Stretch Limitations with therapist assisting    Piriformis Stretch Right;Left;1 rep;30 seconds    Piriformis Stretch Limitations with therapist assisting      Lumbar Exercises: Supine   Bridge 10 reps    Isometric Hip Flexion 10  reps;1 second    Isometric Hip Flexion Limitations with pelvic floor and abdominal contraction; then did 10x each opposite side.      Knee/Hip Exercises: Standing   Hip Abduction Stengthening;Right;Left;1 set;10 reps    Abduction Limitations with pelvic floor contraction , holding onto the counter, therapist vc to lead with the heel    Hip Extension Stengthening;Right;Left;1 set;10 reps    Extension Limitations verbal cues to contract the gluteal                       PT Short Term Goals - 04/27/21 1408       PT SHORT TERM GOAL #1   Title Patient will demonstrate independent use of home exercise program including pelvic floor exercises    Time 4    Period Weeks    Status Achieved    Target Date 03/16/21               PT Long Term Goals - 06/10/21 1318       PT LONG TERM GOAL #1   Title Patient will demonstrate advanced HEP for pelvic floor coordination and strength    Baseline still learning exercises to strengthening the pelvic floor    Time 8    Period Weeks    Status On-going    Target Date 04/13/21      PT LONG TERM GOAL #2   Title Patient will be able to have a bowel movement without straining or pushing by the tailbone to fully empty her stool    Baseline patient does not immediately run to the bathroom and is 55% better.    Time 8    Period Weeks    Status On-going    Target Date 04/13/21      PT LONG TERM GOAL #3   Title Patient will report her stool leakage has improved >/= 75% due to improved pelvic floor coordination    Baseline improved by 70% better    Time 8    Period Weeks    Status On-going    Target Date 04/13/21      PT LONG TERM GOAL #4   Title Patient will be able to demonstrate correct  toileting using the squatty potty to push stool out and not having to assist    Time 8    Period Weeks    Status Achieved    Target Date 04/13/21                   Plan - 06/10/21 1313  Clinical Impression Statement Patient  is sitting on the commode for 15-20 minutes so it was suggested to sit for no more than 10 minutes and to stand and wiggle her hips to see if more comes out. Patient is having a slow say and her legs are not moving well so kept the day easy. Patient will get a cramp in the right hip flexor with isometric hip flexion.Patient gets tightness in the back when on the commode due to sitting on it too long. Patient has difficulty with standing hip abduction and extension exercise and needs tactile cues to move the leg correctly. Patient did not mention anything with fecal leakage today. Patient will benefit from skilled therapy to improve stool emptying and reduce fecal leakage.    Personal Factors and Comorbidities Comorbidity 3+;Time since onset of injury/illness/exacerbation;Past/Current Experience;Fitness    Comorbidities Stroke 12/2019; End stage renal disease; Raynauds disease; Sclerodema; RA; Pelvic laproscopy for myomectomy 2011; Dialyssis; C-section    Examination-Activity Limitations Continence;Toileting    Stability/Clinical Decision Making Stable/Uncomplicated    Rehab Potential Good    PT Frequency 1x / week    PT Duration 12 weeks    PT Treatment/Interventions ADLs/Self Care Home Management;Biofeedback;Therapeutic activities;Therapeutic exercise;Neuromuscular re-education;Patient/family education;Manual techniques;Spinal Manipulations    PT Next Visit Plan work on hip strength and pelvic strength, update HEP with new exercises; check on fecal leakage    PT Home Exercise Plan Access Code: [email or web address]    [email or web address] and Agree with Plan of Care Patient             Patient will benefit from skilled therapeutic intervention in order to improve the following deficits and impairments:  Decreased range of motion  Visit Diagnosis: Muscle weakness (generalized)  Other lack of coordination     Problem List Patient Active Problem List   Diagnosis Date Noted   Neck pain 10/30/2020   Knee  locking, left 10/05/2020   Abnormality of gait 02/25/2020   Protein-calorie malnutrition, severe 01/19/2020   Epistaxis    Sleep disturbance    Slow transit constipation    Hemorrhoids    Chronic systolic congestive heart failure ([place])    ESRD on dialysis ([place])    Right hemiparesis ([place])    Pressure injury of skin 12/20/2019   Intraparenchymal hemorrhage of brain ([place]) 12/18/2019   ICH (intracerebral hemorrhage) ([place]) 12/12/2019   Viral disease 08/22/2019   Chronic right-sided heart failure ([place]) 07/09/2019   Supraventricular tachycardia ([place]) 07/09/2019   Other cirrhosis of liver ([place]) 06/27/2019   Heart failure ([place]) 02/21/2019   Heart palpitations 08/14/2018   Other fluid overload 07/27/2018   Encounter for long-term (current) use of other medications 01/18/2018   Hypothyroidism 01/18/2018   Myalgia and myositis 01/18/2018   Chronic nephritis 01/18/2018   Other long term (current) drug therapy 01/18/2018   Sleep apnea 01/18/2018   Unspecified persistent mental disorders due to conditions classified elsewhere 01/18/2018   Venous reflux 01/18/2018   Vitamin B12 deficiency 01/18/2018   Vitamin D deficiency 01/18/2018   Obstructive lung disease ([place]) 12/30/2017   Other pruritus 12/27/2017   Eruption cyst 12/19/2017   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 ([place]) 11/28/2017   Chronic cough 11/11/2017   Pulmonary hypertension ([place]) 11/11/2017   Pulmonary arterial hypertension ([place]) 10/07/2017   Acute ITP ([place]) 01/05/2017   ESRD (end stage renal disease) ([place]) 12/28/2016   Encounter for removal of sutures 09/09/2016   Coagulation defect, unspecified ([place]) 09/01/2016   Underimmunization status 08/05/2016   Hemolytic  anemia ([place]) 07/26/2016   Epistaxis, recurrent 07/26/2016   Unspecified protein-calorie malnutrition ([place]) 07/13/2016   Aftercare including intermittent dialysis ([place]) 07/07/2016   Anemia in chronic kidney disease 07/07/2016    Hypokalemia 07/07/2016   Iron deficiency anemia, unspecified 07/07/2016   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   Nonrheumatic mitral (valve) prolapse 07/07/2016   Other irritable bowel syndrome 07/07/2016   Other secondary thrombocytopenia 07/07/2016   Secondary hyperparathyroidism of renal origin ([place]) 07/07/2016   Thrombocytopenia ([place]) 07/01/2016   ARF (acute renal failure) ([place]) 06/25/2016   Anxiety 06/25/2016   Bilateral carpal tunnel syndrome 10/22/2015   Chronic gout without tophus 10/22/2015   Chronic nonintractable headache 10/08/2015   Fibroid uterus 01/03/2012   H/O vitamin D deficiency 01/03/2012   Post-menopausal 01/03/2012   Hereditary and idiopathic peripheral neuropathy 11/19/2011   Intestinal malabsorption 41/28/2081   Lichen planus 38/87/1959   Low back pain 11/19/2011   Diffuse spasm of esophagus 11/11/2011   ESR raised 11/11/2011   Postinflammatory pulmonary fibrosis ([place]) 11/11/2011   Scleroderma ([place]) 11/16/2010   Rheumatoid arthritis ([place]) 11/16/2010   Raynaud's disease 11/16/2010   Symptomatic menopausal or female climacteric states 11/16/2010    Earlie Counts, PT 06/10/21 1:20 PM  [facility] @ [address] [place], [place], [address] Phone: [phone]   Fax:  [phone]  Name: KENDAHL BUMGARDNER MRN: [id number] Date of Birth: [birth year]

## 2021-06-10 NOTE — Patient Instructions (Signed)
I had a long discussion with the patient and her husband regarding her remote intraparenchymal hemorrhage and mild residual right hemiparesis and leg cramps and answered questions.  I recommend she add co-Q10 200 mg daily to help with statin myalgias and cramps as well as increase the dose of gabapentin 200 mg in the morning and 200 at night.  She will stay on aspirin for stroke prevention and maintain aggressive risk factor modification strict control of hypertension blood pressure goal below 130/90, lipids with LDL cholesterol goal below 70 mg percent and diabetes with hemoglobin A1c goal below 6.5%.  We will check also follow-up lipid profile as well as complete metabolic panel today.  She will return for follow-up in the future in a year or call earlier if necessary.

## 2021-06-11 ENCOUNTER — Other Ambulatory Visit: Payer: Self-pay

## 2021-06-11 DIAGNOSIS — N2581 Secondary hyperparathyroidism of renal origin: Secondary | ICD-10-CM | POA: Diagnosis not present

## 2021-06-11 DIAGNOSIS — D509 Iron deficiency anemia, unspecified: Secondary | ICD-10-CM | POA: Diagnosis not present

## 2021-06-11 DIAGNOSIS — D631 Anemia in chronic kidney disease: Secondary | ICD-10-CM | POA: Diagnosis not present

## 2021-06-11 DIAGNOSIS — N186 End stage renal disease: Secondary | ICD-10-CM | POA: Diagnosis not present

## 2021-06-11 DIAGNOSIS — M62838 Other muscle spasm: Secondary | ICD-10-CM | POA: Diagnosis not present

## 2021-06-11 DIAGNOSIS — Z992 Dependence on renal dialysis: Secondary | ICD-10-CM | POA: Diagnosis not present

## 2021-06-11 LAB — COMPREHENSIVE METABOLIC PANEL
ALT: 16 IU/L (ref 0–32)
AST: 21 IU/L (ref 0–40)
Albumin/Globulin Ratio: 1.3 (ref 1.2–2.2)
Albumin: 4.3 g/dL (ref 3.8–4.8)
Alkaline Phosphatase: 108 IU/L (ref 44–121)
BUN/Creatinine Ratio: 5 — ABNORMAL LOW (ref 12–28)
BUN: 39 mg/dL — ABNORMAL HIGH (ref 8–27)
Bilirubin Total: 0.3 mg/dL (ref 0.0–1.2)
CO2: 28 mmol/L (ref 20–29)
Calcium: 9.9 mg/dL (ref 8.7–10.3)
Chloride: 93 mmol/L — ABNORMAL LOW (ref 96–106)
Creatinine, Ser: 7.77 mg/dL — ABNORMAL HIGH (ref 0.57–1.00)
Globulin, Total: 3.2 g/dL (ref 1.5–4.5)
Glucose: 98 mg/dL (ref 70–99)
Potassium: 4 mmol/L (ref 3.5–5.2)
Sodium: 140 mmol/L (ref 134–144)
Total Protein: 7.5 g/dL (ref 6.0–8.5)
eGFR: 5 mL/min/{1.73_m2} — ABNORMAL LOW (ref >59)

## 2021-06-11 LAB — LIPID PANEL
Chol/HDL Ratio: 2.3 ratio (ref 0.0–4.4)
Cholesterol, Total: 211 mg/dL — ABNORMAL HIGH (ref 100–199)
HDL: 92 mg/dL (ref >39)
LDL Chol Calc (NIH): 105 mg/dL — ABNORMAL HIGH (ref 0–99)
Triglycerides: 80 mg/dL (ref 0–149)
VLDL Cholesterol Cal: 14 mg/dL (ref 5–40)

## 2021-06-11 NOTE — Progress Notes (Signed)
[facility] is requesting script clarification for the this medication.  As per the office visit note "...increase the dose of gabapentin 200 mg in the morning and 200 at night."  Instructions to pharmacy state "take 1 capsule (100 mg total) by mouth 2 (two) times daily as needed. Take 1 capsule in morning and two at night."

## 2021-06-13 DIAGNOSIS — D509 Iron deficiency anemia, unspecified: Secondary | ICD-10-CM | POA: Diagnosis not present

## 2021-06-13 DIAGNOSIS — N2581 Secondary hyperparathyroidism of renal origin: Secondary | ICD-10-CM | POA: Diagnosis not present

## 2021-06-13 DIAGNOSIS — D631 Anemia in chronic kidney disease: Secondary | ICD-10-CM | POA: Diagnosis not present

## 2021-06-13 DIAGNOSIS — Z992 Dependence on renal dialysis: Secondary | ICD-10-CM | POA: Diagnosis not present

## 2021-06-13 DIAGNOSIS — M62838 Other muscle spasm: Secondary | ICD-10-CM | POA: Diagnosis not present

## 2021-06-13 DIAGNOSIS — N186 End stage renal disease: Secondary | ICD-10-CM | POA: Diagnosis not present

## 2021-06-15 MED ORDER — GABAPENTIN 100 MG PO CAPS: 100.0000 mg | ORAL_CAPSULE | Freq: Three times a day (TID) | ORAL | 0 refills | Status: DC

## 2021-06-16 DIAGNOSIS — M62838 Other muscle spasm: Secondary | ICD-10-CM | POA: Diagnosis not present

## 2021-06-16 DIAGNOSIS — N186 End stage renal disease: Secondary | ICD-10-CM | POA: Diagnosis not present

## 2021-06-16 DIAGNOSIS — D631 Anemia in chronic kidney disease: Secondary | ICD-10-CM | POA: Diagnosis not present

## 2021-06-16 DIAGNOSIS — Z992 Dependence on renal dialysis: Secondary | ICD-10-CM | POA: Diagnosis not present

## 2021-06-16 DIAGNOSIS — D509 Iron deficiency anemia, unspecified: Secondary | ICD-10-CM | POA: Diagnosis not present

## 2021-06-16 DIAGNOSIS — N2581 Secondary hyperparathyroidism of renal origin: Secondary | ICD-10-CM | POA: Diagnosis not present

## 2021-06-17 ENCOUNTER — Ambulatory Visit: Payer: Medicare Other | Attending: Family Medicine | Admitting: Physical Therapy

## 2021-06-17 ENCOUNTER — Encounter: Payer: Self-pay | Admitting: Physical Therapy

## 2021-06-17 DIAGNOSIS — M6281 Muscle weakness (generalized): Secondary | ICD-10-CM | POA: Diagnosis not present

## 2021-06-17 DIAGNOSIS — R278 Other lack of coordination: Secondary | ICD-10-CM | POA: Diagnosis not present

## 2021-06-17 NOTE — Therapy (Signed)
[facility] @ [address] [place], [place], [address] Phone: [phone]   Fax:  [phone]  Physical Therapy Treatment  Patient Details  Name: Katie Nunez MRN: [id number] Date of Birth: 04/15/1955 Referring Provider (PT): Dr. [name]   Encounter Date: 06/17/2021   PT End of Session - 06/17/21 1500     Visit Number 13    Date for PT Re-Evaluation 07/08/21    Authorization Type [place] - Visit Number 13    Authorization - Number of Visits 20    PT Start Time 7353   came late   PT Stop Time 1525    PT Time Calculation (min) 30 min    Activity Tolerance Patient tolerated treatment well    Behavior During Therapy [facility] for tasks assessed/performed             Past Medical History:  Diagnosis Date   Achalasia    Anxiety    Dysplasia of cervix, low grade (CIN 1)    Environmental allergies    "all year long" (12/27/2016)   ESRD (end stage renal disease) on dialysis ([place])    "TTS; [place]" (12/27/2016)   Fibromyalgia    GERD (gastroesophageal reflux disease)    Gout    Hypertension    IBS (irritable bowel syndrome)    MVP (mitral valve prolapse)    RA (rheumatoid arthritis) ([place])    FOLLOWED BY DR. SHANAHAN   Raynaud's disease    Scleroderma ([place])    Seasonal allergies    Thrombocytopenia ([place]) 07/01/2016   Acute fall to 13,000 07/01/16   Tubular adenoma 01/08/2008   CECUM   Vitamin D deficiency     Past Surgical History:  Procedure Laterality Date   ANKLE FRACTURE SURGERY Right    AV FISTULA PLACEMENT Left 06/28/2016   Procedure: left arm ARTERIOVENOUS (AV) FISTULA CREATION;  Surgeon: Rosetta Posner, MD;  [facility];  Service: Vascular;  Laterality: Left;   [place] Left 09/27/2016   Procedure: LEFT UPPER ARM CEPHALIC VEIN TRANSPOSITION;  Surgeon: Rosetta Posner, MD;  [facility];  Service: Vascular;  Laterality: Left;   BREAST BIOPSY     "?  side"   [place]     COLONOSCOPY W/ BIOPSIES  01/08/2008   INSERTION OF DIALYSIS CATHETER Right 06/28/2016   Procedure: INSERTION OF DIALYSIS CATHETER, right internal jugular;  Surgeon: Rosetta Posner, MD;  Location: [facility];  Service: Vascular;  Laterality: Right;   MYOMECTOMY     NASAL ENDOSCOPY WITH EPISTAXIS CONTROL N/A 12/29/2019   Procedure: NASAL ENDOSCOPY WITH EPISTAXIS CONTROL;  Surgeon: Leta Baptist, MD;  Location: [facility];  Service: ENT;  Laterality: N/A;   PELVIC LAPAROSCOPY  2011   superficial thrombophlebitis Left 07-2014    There were no vitals filed for this visit.   Subjective Assessment - 06/17/21 1501     Subjective I was late due ot having a dental appointment. I am not having fecal leakage. I feel like I have stool in the rectum and that feeling is 50% better and I am learning how to ignore it and not go with the first urge. The urge to have a bowel movement wakes her up 1 time per night. Then when she goes it is not a full movement. Patient pushes around the perineal body to have a bowel movement.    Patient Stated  Goals learn how to expel the stool without putting had no tailbone to push the stool out    Currently in Pain? No/denies    Multiple Pain Sites No                               [facility] Adult PT Treatment/Exercise - 06/17/21 0001       Self-Care   Self-Care Other Self-Care Comments    Other Self-Care Comments  educated patient on the anatomy of the small and large intestines and how the stool will move through the too and why she is to do her abodminal massage from right to left.      Exercises   Exercises Other Exercises    Other Exercises  verbally reviewed HEP to see if she has questions on her program      Lumbar Exercises: Stretches   Passive Hamstring Stretch Right;Left;1 rep;30 seconds    Passive Hamstring Stretch Limitations supine with therapist assisting    Single Knee to Chest Stretch  Right;Left;1 rep;30 seconds    Single Knee to Chest Stretch Limitations with therapist assisting    Piriformis Stretch Right;Left;1 rep;30 seconds    Piriformis Stretch Limitations with therapist assisting      Knee/Hip Exercises: Standing   Hip Abduction Stengthening;Right;Left;1 set;10 reps    Abduction Limitations with pelvic floor contraction , holding onto the counter, therapist vc to lead with the heel    Hip Extension Stengthening;Right;Left;1 set;10 reps    Extension Limitations verbal cues to contract the gluteal                     PT Education - 06/17/21 1529     Education Details Access Code: [email or web address]    Person(s) Educated Patient    Methods Explanation    Comprehension Verbalized understanding              PT Short Term Goals - 04/27/21 1408       PT SHORT TERM GOAL #1   Title Patient will demonstrate independent use of home exercise program including pelvic floor exercises    Time 4    Period Weeks    Status Achieved    Target Date 03/16/21               PT Long Term Goals - 06/17/21 1534       PT LONG TERM GOAL #1   Title Patient will demonstrate advanced HEP for pelvic floor coordination and strength    Baseline still learning exercises to strengthening the pelvic floor    Time 8    Status On-going    Target Date 04/13/21      PT LONG TERM GOAL #2   Title Patient will be able to have a bowel movement without straining or pushing by the tailbone to fully empty her stool    Baseline patient does not immediately run to the bathroom and is 55% better.    Time 8    Period Weeks    Status On-going    Target Date 04/13/21      PT LONG TERM GOAL #3   Title Patient will report her stool leakage has improved >/= 75% due to improved pelvic floor coordination    Time 8    Period Weeks    Status Achieved      PT LONG TERM GOAL #4   Title Patient will be able to demonstrate  correct  toileting using the squatty potty to push stool out  and not having to assist    Time 8    Period Weeks    Status Achieved    Target Date 04/13/21                   Plan - 06/17/21 1529     Clinical Impression Statement Patient is working on the urge to have a bowel movement and the urge is 50% better. Patient gets up 1 time per night to have a bowel movement. Patient does her own abdominal massage to assist with bowel movements but was educated on the motion the stool will go through the intestines and how the small intestines is attached to the large intestines. Patient will press on her perineal body to assist with a bowel movement. Patient is not having stool leakage. Patient will benefit from skilled therapy to improve stool emptying and reduce fecal leakage.    Personal Factors and Comorbidities Comorbidity 3+;Time since onset of injury/illness/exacerbation;Past/Current Experience;Fitness    Comorbidities Stroke 12/2019; End stage renal disease; Raynauds disease; Sclerodema; RA; Pelvic laproscopy for myomectomy 2011; Dialyssis; C-section    Examination-Activity Limitations Continence;Toileting    Stability/Clinical Decision Making Stable/Uncomplicated    Rehab Potential Good    PT Frequency 1x / week    PT Duration 12 weeks    PT Treatment/Interventions ADLs/Self Care Home Management;Biofeedback;Therapeutic activities;Therapeutic exercise;Neuromuscular re-education;Patient/family education;Manual techniques;Spinal Manipulations    PT Next Visit Plan if patient is doing well then discharge    PT Home Exercise Plan Access Code: [email or web address]    Consulted and Agree with Plan of Care Patient             Patient will benefit from skilled therapeutic intervention in order to improve the following deficits and impairments:  Decreased range of motion  Visit Diagnosis: Muscle weakness (generalized)  Other lack of coordination     Problem List Patient Active Problem List   Diagnosis Date Noted   Neck pain 10/30/2020   Knee  locking, left 10/05/2020   Abnormality of gait 02/25/2020   Protein-calorie malnutrition, severe 01/19/2020   Epistaxis    Sleep disturbance    Slow transit constipation    Hemorrhoids    Chronic systolic congestive heart failure ([place])    ESRD on dialysis ([place])    Right hemiparesis ([place])    Pressure injury of skin 12/20/2019   Intraparenchymal hemorrhage of brain ([place]) 12/18/2019   ICH (intracerebral hemorrhage) ([place]) 12/12/2019   Viral disease 08/22/2019   Chronic right-sided heart failure ([place]) 07/09/2019   Supraventricular tachycardia ([place]) 07/09/2019   Other cirrhosis of liver ([place]) 06/27/2019   Heart failure ([place]) 02/21/2019   Heart palpitations 08/14/2018   Other fluid overload 07/27/2018   Encounter for long-term (current) use of other medications 01/18/2018   Hypothyroidism 01/18/2018   Myalgia and myositis 01/18/2018   Chronic nephritis 01/18/2018   Other long term (current) drug therapy 01/18/2018   Sleep apnea 01/18/2018   Unspecified persistent mental disorders due to conditions classified elsewhere 01/18/2018   Venous reflux 01/18/2018   Vitamin B12 deficiency 01/18/2018   Vitamin D deficiency 01/18/2018   Obstructive lung disease ([place]) 12/30/2017   Other pruritus 12/27/2017   Eruption cyst 12/19/2017   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 ([place]) 11/28/2017   Chronic cough 11/11/2017   Pulmonary hypertension ([place]) 11/11/2017   Pulmonary arterial hypertension ([place]) 10/07/2017   Acute ITP ([place]) 01/05/2017   ESRD (end stage  renal disease) ([place]) 12/28/2016   Encounter for removal of sutures 09/09/2016   Coagulation defect, unspecified ([place]) 09/01/2016   Underimmunization status 08/05/2016   Hemolytic anemia ([place]) 07/26/2016   Epistaxis, recurrent 07/26/2016   Unspecified protein-calorie malnutrition ([place]) 07/13/2016   Aftercare including intermittent dialysis ([place]) 07/07/2016   Anemia in chronic kidney disease 07/07/2016    Hypokalemia 07/07/2016   Iron deficiency anemia, unspecified 07/07/2016   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   Nonrheumatic mitral (valve) prolapse 07/07/2016   Other irritable bowel syndrome 07/07/2016   Other secondary thrombocytopenia 07/07/2016   Secondary hyperparathyroidism of renal origin ([place]) 07/07/2016   Thrombocytopenia ([place]) 07/01/2016   ARF (acute renal failure) ([place]) 06/25/2016   Anxiety 06/25/2016   Bilateral carpal tunnel syndrome 10/22/2015   Chronic gout without tophus 10/22/2015   Chronic nonintractable headache 10/08/2015   Fibroid uterus 01/03/2012   H/O vitamin D deficiency 01/03/2012   Post-menopausal 01/03/2012   Hereditary and idiopathic peripheral neuropathy 11/19/2011   Intestinal malabsorption 72/89/7915   Lichen planus 04/23/6436   Low back pain 11/19/2011   Diffuse spasm of esophagus 11/11/2011   ESR raised 11/11/2011   Postinflammatory pulmonary fibrosis ([place]) 11/11/2011   Scleroderma ([place]) 11/16/2010   Rheumatoid arthritis ([place]) 11/16/2010   Raynaud's disease 11/16/2010   Symptomatic menopausal or female climacteric states 11/16/2010    Earlie Counts, PT 06/17/21 3:35 PM  [facility] @ [address] [place], [place], [address] Phone: [phone]   Fax:  [phone]  Name: RAMSHA LONIGRO MRN: [id number] Date of Birth: 03/08/1955

## 2021-06-17 NOTE — Patient Instructions (Signed)
Access Code: [email or web address] URL: [email or web address] Date: 06/17/2021 Prepared by: Earlie Counts  Exercises - Sidelying Pelvic Floor Contraction with Self-Palpation  - 2-4 x daily - 7 x weekly - 1 sets - 5 reps - Hooklying Isometric Hip Flexion  - 1 x daily - 7 x weekly - 2 sets - 10 reps - 5 sec hold - Supine Diaphragmatic Breathing  - 2 x daily - 7 x weekly - 1 sets - 10 reps - Seated Diaphragmatic Breathing  - 2 x daily - 7 x weekly - 1 sets - 10 reps - Seated Sidebending  - 1 x daily - 7 x weekly - 2 sets - 10 reps - Hooklying Isometric Hip Flexion  - 1 x daily - 3 x weekly - 2 sets - 5 reps - 5 sec hold - Hooklying Isometric Hip Flexion with Opposite Arm  - 1 x daily - 3 x weekly - 2 sets - 5 reps - 5 sec hold - Seated Pelvic Floor Contraction  - 3 x daily - 7 x weekly - 1 sets - 10 reps - 5 reps hold - Seated Quick Flick Pelvic Floor Contractions  - 3 x daily - 7 x weekly - 1 sets - 5 reps - Standing Hip Abduction with Counter Support  - 1 x daily - 3 x weekly - 1 sets - 10 reps - Standing Hip Abduction with Counter Support (Mirrored)  - 1 x daily - 3 x weekly - 1 sets - 10 reps [facility] [address] [place], [place] [address] Phone # [phone] Fax [phone]

## 2021-06-18 ENCOUNTER — Other Ambulatory Visit: Payer: Self-pay | Admitting: Neurology

## 2021-06-18 DIAGNOSIS — D631 Anemia in chronic kidney disease: Secondary | ICD-10-CM | POA: Diagnosis not present

## 2021-06-18 DIAGNOSIS — N2581 Secondary hyperparathyroidism of renal origin: Secondary | ICD-10-CM | POA: Diagnosis not present

## 2021-06-18 DIAGNOSIS — N186 End stage renal disease: Secondary | ICD-10-CM | POA: Diagnosis not present

## 2021-06-18 DIAGNOSIS — Z992 Dependence on renal dialysis: Secondary | ICD-10-CM | POA: Diagnosis not present

## 2021-06-18 DIAGNOSIS — D509 Iron deficiency anemia, unspecified: Secondary | ICD-10-CM | POA: Diagnosis not present

## 2021-06-18 DIAGNOSIS — M62838 Other muscle spasm: Secondary | ICD-10-CM | POA: Diagnosis not present

## 2021-06-18 MED ORDER — ATORVASTATIN CALCIUM 80 MG PO TABS: 80.0000 mg | ORAL_TABLET | Freq: Every day | ORAL | 3 refills | Status: AC

## 2021-06-18 NOTE — Progress Notes (Signed)
Kindly inform the patient that cholesterol profile is not satisfactory with the bad cholesterol being higher than expected goal hence I recommend she increase the dose of Lipitor from 40 to 80 mg daily.

## 2021-06-19 ENCOUNTER — Telehealth: Payer: Self-pay | Admitting: *Deleted

## 2021-06-19 DIAGNOSIS — I270 Primary pulmonary hypertension: Secondary | ICD-10-CM | POA: Diagnosis not present

## 2021-06-19 DIAGNOSIS — N186 End stage renal disease: Secondary | ICD-10-CM | POA: Diagnosis not present

## 2021-06-19 DIAGNOSIS — R0609 Other forms of dyspnea: Secondary | ICD-10-CM | POA: Diagnosis not present

## 2021-06-19 NOTE — Telephone Encounter (Signed)
-----   Message from Garvin Fila, MD sent at 06/18/2021  3:45 PM EDT ----- Katie Nunez inform the patient that cholesterol profile is not satisfactory with the bad cholesterol being higher than expected goal hence I recommend she increase the dose of Lipitor from 40 to 80 mg daily.

## 2021-06-19 NOTE — Telephone Encounter (Signed)
I spoke with the patient and informed her of the results. She verbalized understanding of the plan to increase her Lipitor from 40 mg to 80 mg daily. She expressed appreciation for the call. All questions answered.

## 2021-06-20 DIAGNOSIS — D509 Iron deficiency anemia, unspecified: Secondary | ICD-10-CM | POA: Diagnosis not present

## 2021-06-20 DIAGNOSIS — M62838 Other muscle spasm: Secondary | ICD-10-CM | POA: Diagnosis not present

## 2021-06-20 DIAGNOSIS — N186 End stage renal disease: Secondary | ICD-10-CM | POA: Diagnosis not present

## 2021-06-20 DIAGNOSIS — N2581 Secondary hyperparathyroidism of renal origin: Secondary | ICD-10-CM | POA: Diagnosis not present

## 2021-06-20 DIAGNOSIS — D631 Anemia in chronic kidney disease: Secondary | ICD-10-CM | POA: Diagnosis not present

## 2021-06-20 DIAGNOSIS — Z992 Dependence on renal dialysis: Secondary | ICD-10-CM | POA: Diagnosis not present

## 2021-06-23 DIAGNOSIS — N186 End stage renal disease: Secondary | ICD-10-CM | POA: Diagnosis not present

## 2021-06-23 DIAGNOSIS — Z992 Dependence on renal dialysis: Secondary | ICD-10-CM | POA: Diagnosis not present

## 2021-06-23 DIAGNOSIS — M62838 Other muscle spasm: Secondary | ICD-10-CM | POA: Diagnosis not present

## 2021-06-23 DIAGNOSIS — D509 Iron deficiency anemia, unspecified: Secondary | ICD-10-CM | POA: Diagnosis not present

## 2021-06-23 DIAGNOSIS — D631 Anemia in chronic kidney disease: Secondary | ICD-10-CM | POA: Diagnosis not present

## 2021-06-23 DIAGNOSIS — N2581 Secondary hyperparathyroidism of renal origin: Secondary | ICD-10-CM | POA: Diagnosis not present

## 2021-06-24 ENCOUNTER — Ambulatory Visit: Payer: Medicare Other | Admitting: Physical Therapy

## 2021-06-24 ENCOUNTER — Encounter: Payer: Self-pay | Admitting: Physical Therapy

## 2021-06-24 DIAGNOSIS — M6281 Muscle weakness (generalized): Secondary | ICD-10-CM | POA: Diagnosis not present

## 2021-06-24 DIAGNOSIS — R278 Other lack of coordination: Secondary | ICD-10-CM | POA: Diagnosis not present

## 2021-06-24 NOTE — Therapy (Signed)
[facility] @ [place], [place], [address] Phone: [phone]   Fax:  [phone]  Physical Therapy Treatment  Patient Details  Name: Katie Nunez MRN: [id number] Date of Birth: [birth year] Referring Provider (PT): Dr. [name]   Encounter Date: 06/24/2021   PT End of Session - 06/24/21 1450     Visit Number 14    Date for PT Re-Evaluation 07/08/21    Authorization Type [place] - Visit Number 14    Authorization - Number of Visits 20    PT Start Time 1450    PT Stop Time 5361    PT Time Calculation (min) 40 min    Activity Tolerance Patient tolerated treatment well    Behavior During Therapy [facility] for tasks assessed/performed             Past Medical History:  Diagnosis Date   Achalasia    Anxiety    Dysplasia of cervix, low grade (CIN 1)    Environmental allergies    "all year long" (12/27/2016)   ESRD (end stage renal disease) on dialysis ([place])    "TTS; [place]" (12/27/2016)   Fibromyalgia    GERD (gastroesophageal reflux disease)    Gout    Hypertension    IBS (irritable bowel syndrome)    MVP (mitral valve prolapse)    RA (rheumatoid arthritis) ([place])    FOLLOWED BY DR. SHANAHAN   Raynaud's disease    Scleroderma ([place])    Seasonal allergies    Thrombocytopenia ([place]) 07/01/2016   Acute fall to 13,000 07/01/16   Tubular adenoma 01/08/2008   CECUM   Vitamin D deficiency     Past Surgical History:  Procedure Laterality Date   ANKLE FRACTURE SURGERY Right    AV FISTULA PLACEMENT Left 06/28/2016   Procedure: left arm ARTERIOVENOUS (AV) FISTULA CREATION;  Surgeon: Rosetta Posner, MD;  [facility];  Service: Vascular;  Laterality: Left;   [place] Left 09/27/2016   Procedure: LEFT UPPER ARM CEPHALIC VEIN TRANSPOSITION;  Surgeon: Rosetta Posner, MD;  [facility] OR;  Service: Vascular;  Laterality: Left;   BREAST BIOPSY     "? side"    [place]     COLONOSCOPY W/ BIOPSIES  01/08/2008   INSERTION OF DIALYSIS CATHETER Right 06/28/2016   Procedure: INSERTION OF DIALYSIS CATHETER, right internal jugular;  Surgeon: Rosetta Posner, MD;  Location: [facility];  Service: Vascular;  Laterality: Right;   MYOMECTOMY     NASAL ENDOSCOPY WITH EPISTAXIS CONTROL N/A 12/29/2019   Procedure: NASAL ENDOSCOPY WITH EPISTAXIS CONTROL;  Surgeon: Leta Baptist, MD;  Location: [facility];  Service: ENT;  Laterality: N/A;   PELVIC LAPAROSCOPY  2011   superficial thrombophlebitis Left 07-2014    There were no vitals filed for this visit.   Subjective Assessment - 06/24/21 1451     Subjective I am able to manage the prolapse. Left exercises are helping. I stil lfeel there is a little piece left in the rectum.    Patient Stated Goals learn how to expel the stool without putting had no tailbone to push the stool out    Currently in Pain? No/denies                               [facility] Adult PT Treatment/Exercise - 06/24/21  0001       Self-Care   Self-Care Other Self-Care Comments    Other Self-Care Comments  reviewed the anatomy of the colon      Lumbar Exercises: Seated   Sit to Stand 10 reps   with red band around knees, VC to keep knees out   Other Seated Lumbar Exercises seated hip abduction with red band 10x, hold knees apart with red band and heel lifts, sitting hip flexion with red band around knees, sitting wiht alteranate shoulder flexion with 1# in each hand and tactile cues to correct posture; hold 1 # wt in each hand and hip abduction with red band; alternate shoulder and hip flexion with red band around knees;                       PT Short Term Goals - 04/27/21 1408       PT SHORT TERM GOAL #1   Title Patient will demonstrate independent use of home exercise program including pelvic floor exercises    Time 4    Period Weeks    Status Achieved    Target Date 03/16/21                PT Long Term Goals - 06/17/21 1534       PT LONG TERM GOAL #1   Title Patient will demonstrate advanced HEP for pelvic floor coordination and strength    Baseline still learning exercises to strengthening the pelvic floor    Time 8    Status On-going    Target Date 04/13/21      PT LONG TERM GOAL #2   Title Patient will be able to have a bowel movement without straining or pushing by the tailbone to fully empty her stool    Baseline patient does not immediately run to the bathroom and is 55% better.    Time 8    Period Weeks    Status On-going    Target Date 04/13/21      PT LONG TERM GOAL #3   Title Patient will report her stool leakage has improved >/= 75% due to improved pelvic floor coordination    Time 8    Period Weeks    Status Achieved      PT LONG TERM GOAL #4   Title Patient will be able to demonstrate correct  toileting using the squatty potty to push stool out and not having to assist    Time 8    Period Weeks    Status Achieved    Target Date 04/13/21                   Plan - 06/24/21 1528     Clinical Impression Statement Patient is doing better with her prolapse. She will have a bowel movement and feel the last bit is in the rectum by the tailbone. Patient has has some small stool leakage due to her stoool being soft. Patient is working on overall strength which will assist with pelvic floor strength and reduce her leakage. Patient has to take a medication for her dialysis and can soften her stool. Patient only able to do 10 sit to stand with hands on thighs then needs assistance due to fatique. Patient feels her prolapse  symptoms have improved. Patient will benefit from skilled therapy to improve stool emptying and reduce fecal leakage.    Personal Factors and Comorbidities Comorbidity 3+;Time since onset of injury/illness/exacerbation;Past/Current Experience;Fitness  Comorbidities Stroke 12/2019; End stage renal disease; Raynauds  disease; Sclerodema; RA; Pelvic laproscopy for myomectomy 2011; Dialyssis; C-section    Examination-Activity Limitations Continence;Toileting    Stability/Clinical Decision Making Stable/Uncomplicated    Rehab Potential Good    PT Frequency 1x / week    PT Duration 12 weeks    PT Treatment/Interventions ADLs/Self Care Home Management;Biofeedback;Therapeutic activities;Therapeutic exercise;Neuromuscular re-education;Patient/family education;Manual techniques;Spinal Manipulations    PT Next Visit Plan if patient is doing well then discharge    PT Home Exercise Plan Access Code: [email or web address]    Consulted and Agree with Plan of Care Patient             Patient will benefit from skilled therapeutic intervention in order to improve the following deficits and impairments:  Decreased range of motion  Visit Diagnosis: Muscle weakness (generalized)  Other lack of coordination     Problem List Patient Active Problem List   Diagnosis Date Noted   Neck pain 10/30/2020   Knee locking, left 10/05/2020   Abnormality of gait 02/25/2020   Protein-calorie malnutrition, severe 01/19/2020   Epistaxis    Sleep disturbance    Slow transit constipation    Hemorrhoids    Chronic systolic congestive heart failure (HCC)    ESRD on dialysis (HCC)    Right hemiparesis (HCC)    Pressure injury of skin 12/20/2019   Intraparenchymal hemorrhage of brain (HCC) 12/18/2019   ICH (intracerebral hemorrhage) (HCC) 12/12/2019   Viral disease 08/22/2019   Chronic right-sided heart failure (HCC) 07/09/2019   Supraventricular tachycardia (HCC) 07/09/2019   Other cirrhosis of liver (HCC) 06/27/2019   Heart failure (HCC) 02/21/2019   Heart palpitations 08/14/2018   Other fluid overload 07/27/2018   Encounter for long-term (current) use of other medications 01/18/2018   Hypothyroidism 01/18/2018   Myalgia and myositis 01/18/2018   Chronic nephritis 01/18/2018   Other long term (current) drug therapy 01/18/2018    Sleep apnea 01/18/2018   Unspecified persistent mental disorders due to conditions classified elsewhere 01/18/2018   Venous reflux 01/18/2018   Vitamin B12 deficiency 01/18/2018   Vitamin D deficiency 01/18/2018   Obstructive lung disease (HCC) 12/30/2017   Other pruritus 12/27/2017   Eruption cyst 12/19/2017   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 (HCC) 11/28/2017   Chronic cough 11/11/2017   Pulmonary hypertension (HCC) 11/11/2017   Pulmonary arterial hypertension (HCC) 10/07/2017   Acute ITP (HCC) 01/05/2017   ESRD (end stage renal disease) (HCC) 12/28/2016   Encounter for removal of sutures 09/09/2016   Coagulation defect, unspecified (HCC) 09/01/2016   Underimmunization status 08/05/2016   Hemolytic anemia (HCC) 07/26/2016   Epistaxis, recurrent 07/26/2016   Unspecified protein-calorie malnutrition (HCC) 07/13/2016   Aftercare including intermittent dialysis (HCC) 07/07/2016   Anemia in chronic kidney disease 07/07/2016   Hypokalemia 07/07/2016   Iron deficiency anemia, unspecified 07/07/2016   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   Nonrheumatic mitral (valve) prolapse 07/07/2016   Other irritable bowel syndrome 07/07/2016   Other secondary thrombocytopenia 07/07/2016   Secondary hyperparathyroidism of renal origin (HCC) 07/07/2016   Thrombocytopenia (HCC) 07/01/2016   ARF (acute renal failure) (HCC) 06/25/2016   Anxiety 06/25/2016   Bilateral carpal tunnel syndrome 10/22/2015   Chronic gout without tophus 10/22/2015   Chronic nonintractable headache 10/08/2015   Fibroid uterus 01/03/2012   H/O vitamin D deficiency 01/03/2012   Post-menopausal 01/03/2012   Hereditary and idiopathic peripheral neuropathy 11/19/2011   Intestinal malabsorption 11/19/2011   Lichen planus 11/19/2011   Low back pain 11/19/2011     Diffuse spasm of esophagus 11/11/2011   ESR raised 11/11/2011   Postinflammatory pulmonary fibrosis (HCC) 11/11/2011   Scleroderma  (HCC) 11/16/2010   Rheumatoid arthritis (HCC) 11/16/2010   Raynaud's disease 11/16/2010   Symptomatic menopausal or female climacteric states 11/16/2010     , PT 06/24/21 3:36 PM   [facility] @ [place] [address] [place], [place], [address] Phone: [phone]   Fax:  [phone]  Name: Katie Nunez MRN: [id number] Date of Birth: 09/15/1955    

## 2021-06-25 DIAGNOSIS — M62838 Other muscle spasm: Secondary | ICD-10-CM | POA: Insufficient documentation

## 2021-06-25 DIAGNOSIS — N186 End stage renal disease: Secondary | ICD-10-CM | POA: Diagnosis not present

## 2021-06-25 DIAGNOSIS — D509 Iron deficiency anemia, unspecified: Secondary | ICD-10-CM | POA: Diagnosis not present

## 2021-06-25 DIAGNOSIS — Z992 Dependence on renal dialysis: Secondary | ICD-10-CM | POA: Diagnosis not present

## 2021-06-25 DIAGNOSIS — N2581 Secondary hyperparathyroidism of renal origin: Secondary | ICD-10-CM | POA: Diagnosis not present

## 2021-06-25 DIAGNOSIS — D631 Anemia in chronic kidney disease: Secondary | ICD-10-CM | POA: Diagnosis not present

## 2021-06-27 DIAGNOSIS — D509 Iron deficiency anemia, unspecified: Secondary | ICD-10-CM | POA: Diagnosis not present

## 2021-06-27 DIAGNOSIS — N186 End stage renal disease: Secondary | ICD-10-CM | POA: Diagnosis not present

## 2021-06-27 DIAGNOSIS — N2581 Secondary hyperparathyroidism of renal origin: Secondary | ICD-10-CM | POA: Diagnosis not present

## 2021-06-27 DIAGNOSIS — D631 Anemia in chronic kidney disease: Secondary | ICD-10-CM | POA: Diagnosis not present

## 2021-06-27 DIAGNOSIS — M62838 Other muscle spasm: Secondary | ICD-10-CM | POA: Diagnosis not present

## 2021-06-27 DIAGNOSIS — Z992 Dependence on renal dialysis: Secondary | ICD-10-CM | POA: Diagnosis not present

## 2021-06-30 DIAGNOSIS — D509 Iron deficiency anemia, unspecified: Secondary | ICD-10-CM | POA: Diagnosis not present

## 2021-06-30 DIAGNOSIS — M62838 Other muscle spasm: Secondary | ICD-10-CM | POA: Diagnosis not present

## 2021-06-30 DIAGNOSIS — Z992 Dependence on renal dialysis: Secondary | ICD-10-CM | POA: Diagnosis not present

## 2021-06-30 DIAGNOSIS — D631 Anemia in chronic kidney disease: Secondary | ICD-10-CM | POA: Diagnosis not present

## 2021-06-30 DIAGNOSIS — N186 End stage renal disease: Secondary | ICD-10-CM | POA: Diagnosis not present

## 2021-06-30 DIAGNOSIS — N2581 Secondary hyperparathyroidism of renal origin: Secondary | ICD-10-CM | POA: Diagnosis not present

## 2021-07-01 ENCOUNTER — Encounter: Payer: Self-pay | Admitting: Physical Therapy

## 2021-07-01 ENCOUNTER — Ambulatory Visit: Payer: Medicare Other | Admitting: Physical Therapy

## 2021-07-01 DIAGNOSIS — R278 Other lack of coordination: Secondary | ICD-10-CM | POA: Diagnosis not present

## 2021-07-01 DIAGNOSIS — M6281 Muscle weakness (generalized): Secondary | ICD-10-CM

## 2021-07-01 NOTE — Therapy (Signed)
[facility] @ [place], [place], [address] Phone: [phone]   Fax:  [phone]  Physical Therapy Treatment  Patient Details  Name: Katie Nunez MRN: [id number] Date of Birth: 09-30-1955 Referring Provider (PT): Dr. [name]   Encounter Date: 07/01/2021   PT End of Session - 07/01/21 1510     Visit Number 15    Date for PT Re-Evaluation 07/08/21    Authorization Type [place] - Visit Number 15    Authorization - Number of Visits 20    PT Start Time 6314   car trouble   PT Stop Time 1530    PT Time Calculation (min) 19 min    Activity Tolerance Patient tolerated treatment well    Behavior During Therapy [facility] for tasks assessed/performed             Past Medical History:  Diagnosis Date   Achalasia    Anxiety    Dysplasia of cervix, low grade (CIN 1)    Environmental allergies    "all year long" (12/27/2016)   ESRD (end stage renal disease) on dialysis ([place])    "TTS; [place]" (12/27/2016)   Fibromyalgia    GERD (gastroesophageal reflux disease)    Gout    Hypertension    IBS (irritable bowel syndrome)    MVP (mitral valve prolapse)    RA (rheumatoid arthritis) ([place])    FOLLOWED BY DR. SHANAHAN   Raynaud's disease    Scleroderma ([place])    Seasonal allergies    Thrombocytopenia ([place]) 07/01/2016   Acute fall to 13,000 07/01/16   Tubular adenoma 01/08/2008   CECUM   Vitamin D deficiency     Past Surgical History:  Procedure Laterality Date   ANKLE FRACTURE SURGERY Right    AV FISTULA PLACEMENT Left 06/28/2016   Procedure: left arm ARTERIOVENOUS (AV) FISTULA CREATION;  Surgeon: Rosetta Posner, MD;  [facility];  Service: Vascular;  Laterality: Left;   [place] Left 09/27/2016   Procedure: LEFT UPPER ARM CEPHALIC VEIN TRANSPOSITION;  Surgeon: Rosetta Posner, MD;  [facility];  Service: Vascular;  Laterality: Left;   BREAST BIOPSY     "?  side"   [place]     COLONOSCOPY W/ BIOPSIES  01/08/2008   INSERTION OF DIALYSIS CATHETER Right 06/28/2016   Procedure: INSERTION OF DIALYSIS CATHETER, right internal jugular;  Surgeon: Rosetta Posner, MD;  Location: [facility];  Service: Vascular;  Laterality: Right;   MYOMECTOMY     NASAL ENDOSCOPY WITH EPISTAXIS CONTROL N/A 12/29/2019   Procedure: NASAL ENDOSCOPY WITH EPISTAXIS CONTROL;  Surgeon: Leta Baptist, MD;  Location: [facility];  Service: ENT;  Laterality: N/A;   PELVIC LAPAROSCOPY  2011   superficial thrombophlebitis Left 07-2014    There were no vitals filed for this visit.   Subjective Assessment - 07/01/21 1511     Subjective I am ready for discharge and will go to rehab closer to home.    Patient Stated Goals learn how to expel the stool without putting had no tailbone to push the stool out    Currently in Pain? No/denies    Multiple Pain Sites No                [facility] PT Assessment - 07/01/21 0001       Assessment   Medical Diagnosis N81.89 Pelvic floor weakness  Referring Provider (PT) Dr. [name]    Prior Therapy not for pelvic floor      Precautions   Precautions None      Restrictions   Weight Bearing Restrictions No      Prior Function   Level of Independence Independent      Cognition   Overall Cognitive Status Within Functional Limits for tasks assessed      Strength   Right Hip Flexion 4/5    Right Hip Extension 4-/5    Right Hip ABduction 4-/5    Right Hip ADduction 4/5                        Pelvic Floor Special Questions - 07/01/21 0001     Falling out feeling (prolapse) Yes    Activities that cause feeling of prolapse bulge improved by 30%    Strength good squeeze, good lift, able to hold agaisnt strong resistance               [facility] Adult PT Treatment/Exercise - 07/01/21 0001       Manual Therapy   Manual Therapy Soft tissue mobilization    Soft tissue mobilization to the  abdomen and improve peristalic motion of the intestines then had patient perform on herself.                       PT Short Term Goals - 07/01/21 1708       PT SHORT TERM GOAL #1   Title Patient will demonstrate independent use of home exercise program including pelvic floor exercises    Time 4    Period Weeks    Status Achieved    Target Date 03/16/21               PT Long Term Goals - 07/01/21 1709       PT LONG TERM GOAL #1   Title Patient will demonstrate advanced HEP for pelvic floor coordination and strength    Time 8    Period Weeks    Status On-going    Target Date 04/13/21      PT LONG TERM GOAL #2   Title Patient will be able to have a bowel movement without straining or pushing by the tailbone to fully empty her stool    Baseline patient does not immediately run to the bathroom and is 55% better.    Time 8    Period Weeks    Status Partially Met    Target Date 04/13/21      PT LONG TERM GOAL #3   Title Patient will report her stool leakage has improved >/= 75% due to improved pelvic floor coordination    Time 8    Period Weeks    Status Achieved    Target Date 04/13/21      PT LONG TERM GOAL #4   Title Patient will be able to demonstrate correct  toileting using the squatty potty to push stool out and not having to assist    Time 8    Period Weeks    Status Achieved    Target Date 04/13/21                    Patient will benefit from skilled therapeutic intervention in order to improve the following deficits and impairments:     Visit Diagnosis: Muscle weakness (generalized)  Other lack of coordination  Problem List Patient Active Problem List   Diagnosis Date Noted   Neck pain 10/30/2020   Knee locking, left 10/05/2020   Abnormality of gait 02/25/2020   Protein-calorie malnutrition, severe 01/19/2020   Epistaxis    Sleep disturbance    Slow transit constipation    Hemorrhoids    Chronic systolic  congestive heart failure ([place])    ESRD on dialysis ([place])    Right hemiparesis (HCC)    Pressure injury of skin 12/20/2019   Intraparenchymal hemorrhage of brain ([place]) 12/18/2019   ICH (intracerebral hemorrhage) ([place]) 12/12/2019   Viral disease 08/22/2019   Chronic right-sided heart failure ([place]) 07/09/2019   Supraventricular tachycardia ([place]) 07/09/2019   Other cirrhosis of liver ([place]) 06/27/2019   Heart failure ([place]) 02/21/2019   Heart palpitations 08/14/2018   Other fluid overload 07/27/2018   Encounter for long-term (current) use of other medications 01/18/2018   Hypothyroidism 01/18/2018   Myalgia and myositis 01/18/2018   Chronic nephritis 01/18/2018   Other long term (current) drug therapy 01/18/2018   Sleep apnea 01/18/2018   Unspecified persistent mental disorders due to conditions classified elsewhere 01/18/2018   Venous reflux 01/18/2018   Vitamin B12 deficiency 01/18/2018   Vitamin D deficiency 01/18/2018   Obstructive lung disease ([place]) 12/30/2017   Other pruritus 12/27/2017   Eruption cyst 12/19/2017   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 ([place]) 11/28/2017   Chronic cough 11/11/2017   Pulmonary hypertension ([place]) 11/11/2017   Pulmonary arterial hypertension ([place]) 10/07/2017   Acute ITP ([place]) 01/05/2017   ESRD (end stage renal disease) ([place]) 12/28/2016   Encounter for removal of sutures 09/09/2016   Coagulation defect, unspecified ([place]) 09/01/2016   Underimmunization status 08/05/2016   Hemolytic anemia ([place]) 07/26/2016   Epistaxis, recurrent 07/26/2016   Unspecified protein-calorie malnutrition ([place]) 07/13/2016   Aftercare including intermittent dialysis ([place]) 07/07/2016   Anemia in chronic kidney disease 07/07/2016   Hypokalemia 07/07/2016   Iron deficiency anemia, unspecified 07/07/2016   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   Nonrheumatic mitral (valve) prolapse 07/07/2016   Other irritable bowel syndrome 07/07/2016   Other  secondary thrombocytopenia 07/07/2016   Secondary hyperparathyroidism of renal origin ([place]) 07/07/2016   Thrombocytopenia ([place]) 07/01/2016   ARF (acute renal failure) ([place]) 06/25/2016   Anxiety 06/25/2016   Bilateral carpal tunnel syndrome 10/22/2015   Chronic gout without tophus 10/22/2015   Chronic nonintractable headache 10/08/2015   Fibroid uterus 01/03/2012   H/O vitamin D deficiency 01/03/2012   Post-menopausal 01/03/2012   Hereditary and idiopathic peripheral neuropathy 11/19/2011   Intestinal malabsorption 98/92/1194   Lichen planus 17/40/8144   Low back pain 11/19/2011   Diffuse spasm of esophagus 11/11/2011   ESR raised 11/11/2011   Postinflammatory pulmonary fibrosis ([place]) 11/11/2011   Scleroderma ([place]) 11/16/2010   Rheumatoid arthritis ([place]) 11/16/2010   Raynaud's disease 11/16/2010   Symptomatic menopausal or female climacteric states 11/16/2010    Earlie Counts, PT 07/01/21 5:10 PM  [facility] @ [place], [place], [address] Phone: [phone]   Fax:  [phone]  Name: Katie Nunez MRN: [id number] Date of Birth: [birth year]  PHYSICAL THERAPY DISCHARGE SUMMARY  Visits from Start of Care: 15  Current functional level related to goals / functional outcomes: See above    Remaining deficits: See above.    Education / Equipment: HEP   Patient agrees to discharge. Patient goals were met. Patient is being discharged due to meeting the stated rehab goals. Thank you for  the referral. Earlie Counts, PT 07/01/21 5:10 PM

## 2021-07-02 DIAGNOSIS — N2581 Secondary hyperparathyroidism of renal origin: Secondary | ICD-10-CM | POA: Diagnosis not present

## 2021-07-02 DIAGNOSIS — D631 Anemia in chronic kidney disease: Secondary | ICD-10-CM | POA: Diagnosis not present

## 2021-07-02 DIAGNOSIS — Z992 Dependence on renal dialysis: Secondary | ICD-10-CM | POA: Diagnosis not present

## 2021-07-02 DIAGNOSIS — D509 Iron deficiency anemia, unspecified: Secondary | ICD-10-CM | POA: Diagnosis not present

## 2021-07-02 DIAGNOSIS — M62838 Other muscle spasm: Secondary | ICD-10-CM | POA: Diagnosis not present

## 2021-07-02 DIAGNOSIS — N186 End stage renal disease: Secondary | ICD-10-CM | POA: Diagnosis not present

## 2021-07-03 ENCOUNTER — Ambulatory Visit: Payer: Medicare Other | Admitting: Orthopaedic Surgery

## 2021-07-04 DIAGNOSIS — D631 Anemia in chronic kidney disease: Secondary | ICD-10-CM | POA: Diagnosis not present

## 2021-07-04 DIAGNOSIS — N186 End stage renal disease: Secondary | ICD-10-CM | POA: Diagnosis not present

## 2021-07-04 DIAGNOSIS — N2581 Secondary hyperparathyroidism of renal origin: Secondary | ICD-10-CM | POA: Diagnosis not present

## 2021-07-04 DIAGNOSIS — D509 Iron deficiency anemia, unspecified: Secondary | ICD-10-CM | POA: Diagnosis not present

## 2021-07-04 DIAGNOSIS — Z992 Dependence on renal dialysis: Secondary | ICD-10-CM | POA: Diagnosis not present

## 2021-07-04 DIAGNOSIS — M62838 Other muscle spasm: Secondary | ICD-10-CM | POA: Diagnosis not present

## 2021-07-07 DIAGNOSIS — Z8679 Personal history of other diseases of the circulatory system: Secondary | ICD-10-CM | POA: Diagnosis not present

## 2021-07-07 DIAGNOSIS — I499 Cardiac arrhythmia, unspecified: Secondary | ICD-10-CM | POA: Diagnosis not present

## 2021-07-07 DIAGNOSIS — Z9889 Other specified postprocedural states: Secondary | ICD-10-CM | POA: Diagnosis not present

## 2021-07-07 DIAGNOSIS — D509 Iron deficiency anemia, unspecified: Secondary | ICD-10-CM | POA: Diagnosis not present

## 2021-07-07 DIAGNOSIS — I471 Supraventricular tachycardia: Secondary | ICD-10-CM | POA: Diagnosis not present

## 2021-07-07 DIAGNOSIS — Z992 Dependence on renal dialysis: Secondary | ICD-10-CM | POA: Diagnosis not present

## 2021-07-07 DIAGNOSIS — M62838 Other muscle spasm: Secondary | ICD-10-CM | POA: Diagnosis not present

## 2021-07-07 DIAGNOSIS — D631 Anemia in chronic kidney disease: Secondary | ICD-10-CM | POA: Diagnosis not present

## 2021-07-07 DIAGNOSIS — I69959 Hemiplegia and hemiparesis following unspecified cerebrovascular disease affecting unspecified side: Secondary | ICD-10-CM | POA: Diagnosis not present

## 2021-07-07 DIAGNOSIS — N2581 Secondary hyperparathyroidism of renal origin: Secondary | ICD-10-CM | POA: Diagnosis not present

## 2021-07-07 DIAGNOSIS — N186 End stage renal disease: Secondary | ICD-10-CM | POA: Diagnosis not present

## 2021-07-08 DIAGNOSIS — K7460 Unspecified cirrhosis of liver: Secondary | ICD-10-CM | POA: Diagnosis not present

## 2021-07-08 DIAGNOSIS — M349 Systemic sclerosis, unspecified: Secondary | ICD-10-CM | POA: Diagnosis not present

## 2021-07-09 ENCOUNTER — Ambulatory Visit: Payer: Medicare Other | Admitting: Surgery

## 2021-07-09 DIAGNOSIS — D509 Iron deficiency anemia, unspecified: Secondary | ICD-10-CM | POA: Diagnosis not present

## 2021-07-09 DIAGNOSIS — Z992 Dependence on renal dialysis: Secondary | ICD-10-CM | POA: Diagnosis not present

## 2021-07-09 DIAGNOSIS — M62838 Other muscle spasm: Secondary | ICD-10-CM | POA: Diagnosis not present

## 2021-07-09 DIAGNOSIS — N2581 Secondary hyperparathyroidism of renal origin: Secondary | ICD-10-CM | POA: Diagnosis not present

## 2021-07-09 DIAGNOSIS — D631 Anemia in chronic kidney disease: Secondary | ICD-10-CM | POA: Diagnosis not present

## 2021-07-09 DIAGNOSIS — N186 End stage renal disease: Secondary | ICD-10-CM | POA: Diagnosis not present

## 2021-07-10 ENCOUNTER — Encounter: Payer: Medicare Other | Admitting: Physical Therapy

## 2021-07-10 ENCOUNTER — Ambulatory Visit: Payer: Medicare Other | Admitting: Orthopaedic Surgery

## 2021-07-10 DIAGNOSIS — Z992 Dependence on renal dialysis: Secondary | ICD-10-CM | POA: Diagnosis not present

## 2021-07-10 DIAGNOSIS — N186 End stage renal disease: Secondary | ICD-10-CM | POA: Diagnosis not present

## 2021-07-10 DIAGNOSIS — N269 Renal sclerosis, unspecified: Secondary | ICD-10-CM | POA: Diagnosis not present

## 2021-07-11 DIAGNOSIS — N2581 Secondary hyperparathyroidism of renal origin: Secondary | ICD-10-CM | POA: Diagnosis not present

## 2021-07-11 DIAGNOSIS — N186 End stage renal disease: Secondary | ICD-10-CM | POA: Diagnosis not present

## 2021-07-11 DIAGNOSIS — D509 Iron deficiency anemia, unspecified: Secondary | ICD-10-CM | POA: Diagnosis not present

## 2021-07-11 DIAGNOSIS — Z992 Dependence on renal dialysis: Secondary | ICD-10-CM | POA: Diagnosis not present

## 2021-07-11 DIAGNOSIS — D631 Anemia in chronic kidney disease: Secondary | ICD-10-CM | POA: Diagnosis not present

## 2021-07-13 ENCOUNTER — Encounter: Payer: Self-pay | Admitting: Adult Health

## 2021-07-14 DIAGNOSIS — D509 Iron deficiency anemia, unspecified: Secondary | ICD-10-CM | POA: Diagnosis not present

## 2021-07-14 DIAGNOSIS — Z992 Dependence on renal dialysis: Secondary | ICD-10-CM | POA: Diagnosis not present

## 2021-07-14 DIAGNOSIS — D631 Anemia in chronic kidney disease: Secondary | ICD-10-CM | POA: Diagnosis not present

## 2021-07-14 DIAGNOSIS — N186 End stage renal disease: Secondary | ICD-10-CM | POA: Diagnosis not present

## 2021-07-14 DIAGNOSIS — N2581 Secondary hyperparathyroidism of renal origin: Secondary | ICD-10-CM | POA: Diagnosis not present

## 2021-07-16 ENCOUNTER — Telehealth: Payer: Self-pay | Admitting: Adult Health

## 2021-07-16 DIAGNOSIS — D509 Iron deficiency anemia, unspecified: Secondary | ICD-10-CM | POA: Diagnosis not present

## 2021-07-16 DIAGNOSIS — D631 Anemia in chronic kidney disease: Secondary | ICD-10-CM | POA: Diagnosis not present

## 2021-07-16 DIAGNOSIS — N2581 Secondary hyperparathyroidism of renal origin: Secondary | ICD-10-CM | POA: Diagnosis not present

## 2021-07-16 DIAGNOSIS — Z992 Dependence on renal dialysis: Secondary | ICD-10-CM | POA: Diagnosis not present

## 2021-07-16 DIAGNOSIS — N186 End stage renal disease: Secondary | ICD-10-CM | POA: Diagnosis not present

## 2021-07-16 NOTE — Telephone Encounter (Signed)
Referral for Physical Therapy sent to [facility] at [facility] [phone].

## 2021-07-18 DIAGNOSIS — Z992 Dependence on renal dialysis: Secondary | ICD-10-CM | POA: Diagnosis not present

## 2021-07-18 DIAGNOSIS — D509 Iron deficiency anemia, unspecified: Secondary | ICD-10-CM | POA: Diagnosis not present

## 2021-07-18 DIAGNOSIS — N2581 Secondary hyperparathyroidism of renal origin: Secondary | ICD-10-CM | POA: Diagnosis not present

## 2021-07-18 DIAGNOSIS — N186 End stage renal disease: Secondary | ICD-10-CM | POA: Diagnosis not present

## 2021-07-18 DIAGNOSIS — D631 Anemia in chronic kidney disease: Secondary | ICD-10-CM | POA: Diagnosis not present

## 2021-07-19 DIAGNOSIS — R0609 Other forms of dyspnea: Secondary | ICD-10-CM | POA: Diagnosis not present

## 2021-07-19 DIAGNOSIS — N186 End stage renal disease: Secondary | ICD-10-CM | POA: Diagnosis not present

## 2021-07-19 DIAGNOSIS — I270 Primary pulmonary hypertension: Secondary | ICD-10-CM | POA: Diagnosis not present

## 2021-07-20 DIAGNOSIS — H2513 Age-related nuclear cataract, bilateral: Secondary | ICD-10-CM | POA: Diagnosis not present

## 2021-07-21 DIAGNOSIS — N2581 Secondary hyperparathyroidism of renal origin: Secondary | ICD-10-CM | POA: Diagnosis not present

## 2021-07-21 DIAGNOSIS — N186 End stage renal disease: Secondary | ICD-10-CM | POA: Diagnosis not present

## 2021-07-21 DIAGNOSIS — D509 Iron deficiency anemia, unspecified: Secondary | ICD-10-CM | POA: Diagnosis not present

## 2021-07-21 DIAGNOSIS — D631 Anemia in chronic kidney disease: Secondary | ICD-10-CM | POA: Diagnosis not present

## 2021-07-21 DIAGNOSIS — Z992 Dependence on renal dialysis: Secondary | ICD-10-CM | POA: Diagnosis not present

## 2021-07-22 ENCOUNTER — Encounter: Payer: Self-pay | Admitting: Podiatry

## 2021-07-22 ENCOUNTER — Ambulatory Visit (INDEPENDENT_AMBULATORY_CARE_PROVIDER_SITE_OTHER): Payer: Medicare Other | Admitting: Podiatry

## 2021-07-22 DIAGNOSIS — M21619 Bunion of unspecified foot: Secondary | ICD-10-CM

## 2021-07-22 DIAGNOSIS — L84 Corns and callosities: Secondary | ICD-10-CM

## 2021-07-22 NOTE — Progress Notes (Signed)
No chief complaint on file.    HPI: Patient is 66 y.o. female who follow-up of  foot pain in bilateral feet. She has a tightness in the skin around her toes and relates that when she contracts her toes.  She has painful calluses on the left bunion prominence and hallux as well as painful calluses on both heels. Relates she never did try the other cream because wasn't able to get a sample. Denies any current treatments. Does relates a history of back pain and burning in feet that happens at night.    Allergies  Allergen Reactions   Other Anaphylaxis and Other (See Comments)    Do not use polyflux membrane.  Use alternate Other reaction(s): heart racing   Savella [Milnacipran Hcl] Palpitations and Other (See Comments)    Unknown   Tape Rash and Other (See Comments)    Itch- unsure if it was paper or adhesive tape    Review of systems is negative except as noted in the HPI.  Denies nausea/ vomiting/ fevers/ chills or night sweats.   Denies difficulty breathing, denies calf pain or tenderness  Physical Exam  Patient is awake, alert, and oriented x 3.  In no acute distress.     Vascular status is intact with palpable pedal pulses DP and PT bilateral and capillary refill time less than 3 seconds bilateral.  No edema or erythema noted along the hallux nails or in the feet or legs.   Neurological exam reveals epicritic and protective sensation grossly intact bilateral.    Dermatological exam reveals skin is mildly xerotic to bilateral feet.  No open lesions present.  Callus tissue is present on the left and right hallux lateral border.  a large callus is present on the left hallux medial aspect as well as the left bunion prominence.  Bilateral heels are also callused.  No sign of open lesions or ulcerations noted.    Musculoskeletal exam: Musculature intact with dorsiflexion, plantarflexion, inversion, eversion. First MPJ joint range of motion mildly decreased.  Bunion deformity is present left.  Pain with plantarflexion of toes reported.   Assessment:     ICD-10-CM   1. Bunion  M21.619     2. Skin callus  L84     3. Corns and callosities  L84       Plan: Discussed exam findings with the patient.  Discussed that her skin is dry and does have areas of callus which are likely affecting the perception of tightness in her feet she is experiencing at today's visit I trimmed the calluses with a #15 blade without complication as courtesy and I recommended continue with cream to feet. Discussed neuropathy and nerve pain. Will resend in prescription compound cream to see if this helps also recommended some OTC topicals.  Discussed following up with PCP about possible etiology from her back.  Patient to follow-up as needed.   Lorenda Peck, DPM

## 2021-07-23 ENCOUNTER — Other Ambulatory Visit: Payer: Self-pay

## 2021-07-23 ENCOUNTER — Ambulatory Visit: Payer: Medicare Other

## 2021-07-23 DIAGNOSIS — D631 Anemia in chronic kidney disease: Secondary | ICD-10-CM | POA: Diagnosis not present

## 2021-07-23 DIAGNOSIS — D509 Iron deficiency anemia, unspecified: Secondary | ICD-10-CM | POA: Diagnosis not present

## 2021-07-23 DIAGNOSIS — N2581 Secondary hyperparathyroidism of renal origin: Secondary | ICD-10-CM | POA: Diagnosis not present

## 2021-07-23 DIAGNOSIS — J8410 Pulmonary fibrosis, unspecified: Secondary | ICD-10-CM

## 2021-07-23 DIAGNOSIS — Z992 Dependence on renal dialysis: Secondary | ICD-10-CM | POA: Diagnosis not present

## 2021-07-23 DIAGNOSIS — N186 End stage renal disease: Secondary | ICD-10-CM | POA: Diagnosis not present

## 2021-07-23 NOTE — Progress Notes (Signed)
NA

## 2021-07-24 ENCOUNTER — Ambulatory Visit: Payer: Medicare Other

## 2021-07-24 ENCOUNTER — Encounter: Payer: Self-pay | Admitting: Emergency Medicine

## 2021-07-24 ENCOUNTER — Ambulatory Visit (INDEPENDENT_AMBULATORY_CARE_PROVIDER_SITE_OTHER): Payer: Medicare Other | Admitting: Emergency Medicine

## 2021-07-24 VITALS — BP 108/66 | HR 74 | Temp 99.1°F | Ht 65.0 in | Wt 133.0 lb

## 2021-07-24 DIAGNOSIS — I2721 Secondary pulmonary arterial hypertension: Secondary | ICD-10-CM

## 2021-07-24 DIAGNOSIS — R053 Chronic cough: Secondary | ICD-10-CM | POA: Diagnosis not present

## 2021-07-24 DIAGNOSIS — J849 Interstitial pulmonary disease, unspecified: Secondary | ICD-10-CM | POA: Diagnosis not present

## 2021-07-24 DIAGNOSIS — J8410 Pulmonary fibrosis, unspecified: Secondary | ICD-10-CM | POA: Diagnosis not present

## 2021-07-24 NOTE — Assessment & Plan Note (Signed)
Followed at [facility].  Her Ambrisentan has been uptitrated to 10 mg daily.  Remains on selexipag, off tadalafil.  She had an echocardiogram 05/20/2021 at [facility].

## 2021-07-24 NOTE — Progress Notes (Signed)
Subjective:    Patient ID: Katie Nunez, female    DOB: [birth year], 66 y.o.   MRN: [id number]  Cough Pertinent negatives include no ear pain, eye redness, fever, headaches, postnasal drip, rash, rhinorrhea, sore throat, shortness of breath or wheezing.   ROV 06/25/20 --follow-up visit for 66 year old woman with a history of scleroderma, rheumatoid arthritis, Raynaud's phenomenon and end-stage renal disease on hemodialysis.  Also with a history of ITP.  She has been on Ambrisentan tadalafil and selexipag for PAH, followed at [facility].  Her tadalafil was recently discontinued due to systemic hypotension especially with HD. She has had chest CT that did show not show significant interstitial lung disease 10/05/2017.   Minimal obstruction on pulmonary function testing, has albuterol available to use if needed.  She does deal with chronic cough that is been principally driven by allergic rhinitis. She has some throat clearing, PND that have increased her cough lately, clear sputum.  Has not used her astelin NS. She is not taking her xyzal right now.  Since I have seen her last she has been treated for hepatic clot, ? Portal vein thrombosis. Then for a CVA. Off anticoagulation.   ROV 07/24/21 --66 year old woman with a history of pulmonary hypertension in the setting of connective tissue disease.  She has rheumatoid arthritis, scleroderma with associated Raynaud's phenomenon.  Also with a history of end-stage renal disease on hemodialysis, remote ITP.  No ILD on CT scan of the chest.  I also followed her for chronic cough in the setting of rhinitis. PMH significant for hepatic thrombosis, question portal vein thrombosis as well as CVA.  She is not on any anticoagulation. Her PAH is followed at [facility].  At her last visit 06/2020 she was being managed on selexipag and Ambrisentan, was off tadalafil due to hypotension at HD. Currently on ambrisentan now '10mg'$ , selexapag.  Still has a lot of nasal congestion and  drainage, associated cough. She has seen some nasal blood. She is on ipratropium NS, not on nasal steroid right now. Off xyzal and now on zyrtec. Uses afrin very rarely if she has epistaxis. She has had cautery by ENT in the past. Her breathing limits her some.     Review of Systems  Constitutional:  Negative for fever and unexpected weight change.  HENT:  Positive for congestion and sneezing. Negative for dental problem, ear pain, nosebleeds, postnasal drip, rhinorrhea, sinus pressure, sore throat and trouble swallowing.   Eyes:  Negative for redness and itching.  Respiratory:  Positive for cough. Negative for chest tightness, shortness of breath and wheezing.   Cardiovascular:  Negative for palpitations and leg swelling.  Gastrointestinal:  Negative for nausea and vomiting.  Genitourinary:  Negative for dysuria.  Musculoskeletal:  Negative for joint swelling.  Skin:  Negative for rash.  Neurological:  Negative for headaches.  Hematological:  Does not bruise/bleed easily.  Psychiatric/Behavioral:  Negative for dysphoric mood. The patient is not nervous/anxious.    Past Medical History:  Diagnosis Date   Achalasia    Anxiety    Dysplasia of cervix, low grade (CIN 1)    Environmental allergies    "all year long" (12/27/2016)   ESRD (end stage renal disease) on dialysis ([place])    "TTS; [place]" (12/27/2016)   Fibromyalgia    GERD (gastroesophageal reflux disease)    Gout    Hypertension    IBS (irritable bowel syndrome)    MVP (mitral valve prolapse)    RA (rheumatoid arthritis) ([place])  FOLLOWED BY DR. SHANAHAN   Raynaud's disease    Scleroderma ([place])    Seasonal allergies    Thrombocytopenia ([place]) 07/01/2016   Acute fall to 13,000 07/01/16   Tubular adenoma 01/08/2008   CECUM   Vitamin D deficiency      Family History  Problem Relation Age of Onset   Hypertension Mother    Diabetes Mother    Heart disease Father    Hypertension Maternal Aunt    Diabetes Maternal  Grandmother    Heart disease Paternal Grandfather    Cerebral palsy Cousin        1ST COUSIN?   Diabetes Paternal Grandmother      Social History   Socioeconomic History   Marital status: Married    Spouse name: Not on file   Number of children: Not on file   Years of education: Not on file   Highest education level: Not on file  Occupational History   Not on file  Tobacco Use   Smoking status: Never   Smokeless tobacco: Never  Vaping Use   Vaping Use: Never used  Substance and Sexual Activity   Alcohol use: No   Drug use: No   Sexual activity: Not Currently    Birth control/protection: Post-menopausal  Other Topics Concern   Not on file  Social History Narrative   Lives in [place]   Social Determinants of Health   Financial Resource Strain: Not on file  Food Insecurity: Not on file  Transportation Needs: Not on file  Physical Activity: Not on file  Stress: Not on file  Social Connections: Not on file  Intimate Partner Violence: Not on file     Allergies  Allergen Reactions   Other Anaphylaxis and Other (See Comments)    Do not use polyflux membrane.  Use alternate Other reaction(s): heart racing   Savella [Milnacipran Hcl] Palpitations and Other (See Comments)    Unknown   Tape Rash and Other (See Comments)    Itch- unsure if it was paper or adhesive tape     Outpatient Medications Prior to Visit  Medication Sig Dispense Refill   ambrisentan (LETAIRIS) 5 MG tablet Take 5 mg by mouth daily.     aspirin EC 81 MG tablet Take 1 tablet (81 mg total) by mouth daily. Swallow whole. 30 tablet 11   atorvastatin (LIPITOR) 80 MG tablet Take 1 tablet (80 mg total) by mouth daily. 90 tablet 3   azelastine (ASTELIN) 0.1 % nasal spray Place 1 spray into both nostrils 2 (two) times daily as needed for rhinitis. Use in each nostril as directed 30 mL 12   calcium acetate (PHOSLO) 667 MG capsule Take by mouth.     camphor-menthol (SARNA) lotion Apply topically 2 (two)  times daily. (Patient taking differently: Apply topically as needed.) 222 mL 0   clonazePAM (KLONOPIN) 0.5 MG tablet Take 1 tablet (0.5 mg total) by mouth 2 (two) times daily as needed for anxiety. 30 tablet 0   co-enzyme Q-10 30 MG capsule Take 7 capsules (210 mg total) by mouth 3 (three) times daily. 1 capsule 1   cyclobenzaprine (FLEXERIL) 5 MG tablet Take 1 tablet (5 mg total) by mouth every 8 (eight) hours as needed for muscle spasms. 60 tablet 4   docusate sodium (COLACE) 100 MG capsule Take 2 capsules (200 mg total) by mouth daily. (Patient taking differently: Take 200 mg by mouth as needed.) 60 capsule 0   doxycycline (VIBRA-TABS) 100 MG tablet Take 100 mg  by mouth 2 (two) times daily.     famotidine (PEPCID) 20 MG tablet Take 1 tablet (20 mg total) by mouth daily as needed for heartburn or indigestion. (Patient taking differently: Take 40 mg by mouth 2 (two) times daily.) 30 tablet 0   gabapentin (NEURONTIN) 100 MG capsule Take 1 capsule (100 mg total) by mouth 3 (three) times daily. Take 1 capsule in morning and two at night 90 capsule 0   hydrocerin (EUCERIN) CREA Apply 1 application topically 2 (two) times daily. To dry skin--avoid IV site 454 g 0   hydrOXYzine (ATARAX/VISTARIL) 25 MG tablet Take 25 mg by mouth every 8 (eight) hours as needed for itching.      levocetirizine (XYZAL) 5 MG tablet SMARTSIG:1 Tablet(s) By Mouth Every Evening     lidocaine-prilocaine (EMLA) cream Apply 1 application topically as needed. Apply to toes as needed for pain 30 g 3   lubiprostone (AMITIZA) 8 MCG capsule Take 8 mcg by mouth 2 (two) times daily with a meal.     multivitamin (RENA-VIT) TABS tablet Take 1 tablet by mouth daily at 6 (six) AM. 30 tablet 0   oxymetazoline (AFRIN) 0.05 % nasal spray Place 1 spray into both nostrils 2 (two) times daily as needed for congestion. 30 mL 0   Selexipag (UPTRAVI) 800 MCG TABS Take 1 tablet (800 mcg total) by mouth in the morning and at bedtime. 60 tablet 0    sodium chloride (OCEAN) 0.65 % SOLN nasal spray Place 1 spray into both nostrils 5 (five) times daily. At least 5 times a day  0   No facility-administered medications prior to visit.        Objective:   Physical Exam Vitals:   07/24/21 1016  BP: 108/66  Pulse: 74  Temp: 99.1 F (37.3 C)  TempSrc: Oral  SpO2: 97%  Weight: 133 lb (60.3 kg)  Height: '5\' 5"'$  (1.651 m)   Gen: Pleasant, well-nourished, in no distress,  normal affect  ENT: No lesions,  mouth clear,  oropharynx clear, no postnasal drip  Neck: No JVD, no stridor  Lungs: No use of accessory muscles, decreased at bases, no wheeze or crackles.   Cardiovascular: RRR, heart sounds normal, no murmur or gallops, no peripheral edema  Musculoskeletal: No deformities, no cyanosis or clubbing  Neuro: alert, non focal  Skin: Warm, no lesions or rash     Assessment & Plan:  Postinflammatory pulmonary fibrosis (HCC) Her previous CT chest has been reassuring.  No crackles on exam today.  She is at risk for interstitial lung disease and we will repeat a high-resolution CT to compare with priors.  If stable then we can continue to follow principally with chest x-rays.  Pulmonary arterial hypertension ([place]) Followed at [facility].  Her Ambrisentan has been uptitrated to 10 mg daily.  Remains on selexipag, off tadalafil.  She had an echocardiogram 05/20/2021 at [facility].  Chronic cough Being driven by chronic rhinitis.  She is no longer on Astelin, is on an alternative, I think this is Atrovent nasal spray.  I will restart her nasal steroid.  Continue the Zyrtec.  Need to avoid causing too much dryness because she has had difficulty with epistaxis in the past.  She has nasal saline available.  Baltazar Apo, MD, PhD 07/24/2021, 10:42 AM [facility] [phone] or if no answer [phone]

## 2021-07-24 NOTE — Patient Instructions (Addendum)
Please continue your Ambrisentan and selexipag as you have been taking them  We will plan to repeat a high-resolution CT scan of your chest in September 2023 to compare with your priors. Restart fluticasone nasal spray, 2 sprays each nostril twice daily. You can still use ipratropium nasal spray, 2 sprays each nostril up to 3 times daily when you need it for drainage and congestion Continue your Zyrtec once daily Follow with Dr Lamonte Sakai after your CT scan to review the results together.

## 2021-07-24 NOTE — Assessment & Plan Note (Signed)
Being driven by chronic rhinitis.  She is no longer on Astelin, is on an alternative, I think this is Atrovent nasal spray.  I will restart her nasal steroid.  Continue the Zyrtec.  Need to avoid causing too much dryness because she has had difficulty with epistaxis in the past.  She has nasal saline available.

## 2021-07-24 NOTE — Assessment & Plan Note (Signed)
Her previous CT chest has been reassuring.  No crackles on exam today.  She is at risk for interstitial lung disease and we will repeat a high-resolution CT to compare with priors.  If stable then we can continue to follow principally with chest x-rays.

## 2021-07-25 DIAGNOSIS — N2581 Secondary hyperparathyroidism of renal origin: Secondary | ICD-10-CM | POA: Diagnosis not present

## 2021-07-25 DIAGNOSIS — Z992 Dependence on renal dialysis: Secondary | ICD-10-CM | POA: Diagnosis not present

## 2021-07-25 DIAGNOSIS — D631 Anemia in chronic kidney disease: Secondary | ICD-10-CM | POA: Diagnosis not present

## 2021-07-25 DIAGNOSIS — D509 Iron deficiency anemia, unspecified: Secondary | ICD-10-CM | POA: Diagnosis not present

## 2021-07-25 DIAGNOSIS — N186 End stage renal disease: Secondary | ICD-10-CM | POA: Diagnosis not present

## 2021-07-27 ENCOUNTER — Telehealth (HOSPITAL_BASED_OUTPATIENT_CLINIC_OR_DEPARTMENT_OTHER): Payer: Self-pay

## 2021-07-28 DIAGNOSIS — D509 Iron deficiency anemia, unspecified: Secondary | ICD-10-CM | POA: Diagnosis not present

## 2021-07-28 DIAGNOSIS — D631 Anemia in chronic kidney disease: Secondary | ICD-10-CM | POA: Diagnosis not present

## 2021-07-28 DIAGNOSIS — Z992 Dependence on renal dialysis: Secondary | ICD-10-CM | POA: Diagnosis not present

## 2021-07-28 DIAGNOSIS — N2581 Secondary hyperparathyroidism of renal origin: Secondary | ICD-10-CM | POA: Diagnosis not present

## 2021-07-28 DIAGNOSIS — N186 End stage renal disease: Secondary | ICD-10-CM | POA: Diagnosis not present

## 2021-07-29 NOTE — Telephone Encounter (Signed)
[place], spoke with Lorriane Shire she states the referral expired. Resent the referral via fax.

## 2021-07-29 NOTE — Telephone Encounter (Signed)
Pt states she just spoke with [place] at [facility]  and they told her they did not get the referral, pt is asking for it to please be resent

## 2021-07-30 DIAGNOSIS — D631 Anemia in chronic kidney disease: Secondary | ICD-10-CM | POA: Diagnosis not present

## 2021-07-30 DIAGNOSIS — N2581 Secondary hyperparathyroidism of renal origin: Secondary | ICD-10-CM | POA: Diagnosis not present

## 2021-07-30 DIAGNOSIS — N186 End stage renal disease: Secondary | ICD-10-CM | POA: Diagnosis not present

## 2021-07-30 DIAGNOSIS — D509 Iron deficiency anemia, unspecified: Secondary | ICD-10-CM | POA: Diagnosis not present

## 2021-07-30 DIAGNOSIS — Z992 Dependence on renal dialysis: Secondary | ICD-10-CM | POA: Diagnosis not present

## 2021-08-01 DIAGNOSIS — D631 Anemia in chronic kidney disease: Secondary | ICD-10-CM | POA: Diagnosis not present

## 2021-08-01 DIAGNOSIS — Z992 Dependence on renal dialysis: Secondary | ICD-10-CM | POA: Diagnosis not present

## 2021-08-01 DIAGNOSIS — D509 Iron deficiency anemia, unspecified: Secondary | ICD-10-CM | POA: Diagnosis not present

## 2021-08-01 DIAGNOSIS — N186 End stage renal disease: Secondary | ICD-10-CM | POA: Diagnosis not present

## 2021-08-01 DIAGNOSIS — N2581 Secondary hyperparathyroidism of renal origin: Secondary | ICD-10-CM | POA: Diagnosis not present

## 2021-08-03 ENCOUNTER — Telehealth: Payer: Self-pay

## 2021-08-03 DIAGNOSIS — G8110 Spastic hemiplegia affecting unspecified side: Secondary | ICD-10-CM

## 2021-08-03 DIAGNOSIS — R2689 Other abnormalities of gait and mobility: Secondary | ICD-10-CM

## 2021-08-03 DIAGNOSIS — G8191 Hemiplegia, unspecified affecting right dominant side: Secondary | ICD-10-CM

## 2021-08-03 NOTE — Telephone Encounter (Signed)
I have re-entered the referral.

## 2021-08-03 NOTE — Addendum Note (Signed)
Addended by: Verlin Grills on: 08/03/2021 09:03 AM   Modules accepted: Orders

## 2021-08-03 NOTE — Telephone Encounter (Signed)
Spoke with [employer], they advised they received a copy of the January referral on July 6th. They advised that referrals are only good for six months and that January referral has now expired. They are asking that a new referral with an updated date be sent to them for this patient. It can be entered as an internal referral.

## 2021-08-03 NOTE — Telephone Encounter (Signed)
New referral has been sent  and should be in their workqueue.

## 2021-08-03 NOTE — Telephone Encounter (Signed)
Noted! Thank you

## 2021-08-04 DIAGNOSIS — N186 End stage renal disease: Secondary | ICD-10-CM | POA: Diagnosis not present

## 2021-08-04 DIAGNOSIS — D631 Anemia in chronic kidney disease: Secondary | ICD-10-CM | POA: Diagnosis not present

## 2021-08-04 DIAGNOSIS — N2581 Secondary hyperparathyroidism of renal origin: Secondary | ICD-10-CM | POA: Diagnosis not present

## 2021-08-04 DIAGNOSIS — Z992 Dependence on renal dialysis: Secondary | ICD-10-CM | POA: Diagnosis not present

## 2021-08-04 DIAGNOSIS — D509 Iron deficiency anemia, unspecified: Secondary | ICD-10-CM | POA: Diagnosis not present

## 2021-08-05 ENCOUNTER — Ambulatory Visit: Payer: Medicare Other | Admitting: Orthopaedic Surgery

## 2021-08-06 DIAGNOSIS — N2581 Secondary hyperparathyroidism of renal origin: Secondary | ICD-10-CM | POA: Diagnosis not present

## 2021-08-06 DIAGNOSIS — Z992 Dependence on renal dialysis: Secondary | ICD-10-CM | POA: Diagnosis not present

## 2021-08-06 DIAGNOSIS — N186 End stage renal disease: Secondary | ICD-10-CM | POA: Diagnosis not present

## 2021-08-06 DIAGNOSIS — D631 Anemia in chronic kidney disease: Secondary | ICD-10-CM | POA: Diagnosis not present

## 2021-08-06 DIAGNOSIS — D509 Iron deficiency anemia, unspecified: Secondary | ICD-10-CM | POA: Diagnosis not present

## 2021-08-08 DIAGNOSIS — D509 Iron deficiency anemia, unspecified: Secondary | ICD-10-CM | POA: Diagnosis not present

## 2021-08-08 DIAGNOSIS — Z992 Dependence on renal dialysis: Secondary | ICD-10-CM | POA: Diagnosis not present

## 2021-08-08 DIAGNOSIS — D631 Anemia in chronic kidney disease: Secondary | ICD-10-CM | POA: Diagnosis not present

## 2021-08-08 DIAGNOSIS — N186 End stage renal disease: Secondary | ICD-10-CM | POA: Diagnosis not present

## 2021-08-08 DIAGNOSIS — N2581 Secondary hyperparathyroidism of renal origin: Secondary | ICD-10-CM | POA: Diagnosis not present

## 2021-08-10 DIAGNOSIS — N269 Renal sclerosis, unspecified: Secondary | ICD-10-CM | POA: Diagnosis not present

## 2021-08-10 DIAGNOSIS — Z992 Dependence on renal dialysis: Secondary | ICD-10-CM | POA: Diagnosis not present

## 2021-08-10 DIAGNOSIS — N186 End stage renal disease: Secondary | ICD-10-CM | POA: Diagnosis not present

## 2021-08-11 DIAGNOSIS — N186 End stage renal disease: Secondary | ICD-10-CM | POA: Diagnosis not present

## 2021-08-11 DIAGNOSIS — D631 Anemia in chronic kidney disease: Secondary | ICD-10-CM | POA: Diagnosis not present

## 2021-08-11 DIAGNOSIS — N2581 Secondary hyperparathyroidism of renal origin: Secondary | ICD-10-CM | POA: Diagnosis not present

## 2021-08-11 DIAGNOSIS — Z992 Dependence on renal dialysis: Secondary | ICD-10-CM | POA: Diagnosis not present

## 2021-08-11 DIAGNOSIS — D509 Iron deficiency anemia, unspecified: Secondary | ICD-10-CM | POA: Diagnosis not present

## 2021-08-13 DIAGNOSIS — Z992 Dependence on renal dialysis: Secondary | ICD-10-CM | POA: Diagnosis not present

## 2021-08-13 DIAGNOSIS — D509 Iron deficiency anemia, unspecified: Secondary | ICD-10-CM | POA: Diagnosis not present

## 2021-08-13 DIAGNOSIS — N186 End stage renal disease: Secondary | ICD-10-CM | POA: Diagnosis not present

## 2021-08-13 DIAGNOSIS — D631 Anemia in chronic kidney disease: Secondary | ICD-10-CM | POA: Diagnosis not present

## 2021-08-13 DIAGNOSIS — N2581 Secondary hyperparathyroidism of renal origin: Secondary | ICD-10-CM | POA: Diagnosis not present

## 2021-08-15 DIAGNOSIS — Z992 Dependence on renal dialysis: Secondary | ICD-10-CM | POA: Diagnosis not present

## 2021-08-15 DIAGNOSIS — N186 End stage renal disease: Secondary | ICD-10-CM | POA: Diagnosis not present

## 2021-08-15 DIAGNOSIS — D509 Iron deficiency anemia, unspecified: Secondary | ICD-10-CM | POA: Diagnosis not present

## 2021-08-15 DIAGNOSIS — N2581 Secondary hyperparathyroidism of renal origin: Secondary | ICD-10-CM | POA: Diagnosis not present

## 2021-08-15 DIAGNOSIS — D631 Anemia in chronic kidney disease: Secondary | ICD-10-CM | POA: Diagnosis not present

## 2021-08-18 DIAGNOSIS — D631 Anemia in chronic kidney disease: Secondary | ICD-10-CM | POA: Diagnosis not present

## 2021-08-18 DIAGNOSIS — N186 End stage renal disease: Secondary | ICD-10-CM | POA: Diagnosis not present

## 2021-08-18 DIAGNOSIS — N2581 Secondary hyperparathyroidism of renal origin: Secondary | ICD-10-CM | POA: Diagnosis not present

## 2021-08-18 DIAGNOSIS — Z992 Dependence on renal dialysis: Secondary | ICD-10-CM | POA: Diagnosis not present

## 2021-08-18 DIAGNOSIS — D509 Iron deficiency anemia, unspecified: Secondary | ICD-10-CM | POA: Diagnosis not present

## 2021-08-19 DIAGNOSIS — I270 Primary pulmonary hypertension: Secondary | ICD-10-CM | POA: Diagnosis not present

## 2021-08-19 DIAGNOSIS — R0609 Other forms of dyspnea: Secondary | ICD-10-CM | POA: Diagnosis not present

## 2021-08-19 DIAGNOSIS — N186 End stage renal disease: Secondary | ICD-10-CM | POA: Diagnosis not present

## 2021-08-20 DIAGNOSIS — D509 Iron deficiency anemia, unspecified: Secondary | ICD-10-CM | POA: Diagnosis not present

## 2021-08-20 DIAGNOSIS — Z992 Dependence on renal dialysis: Secondary | ICD-10-CM | POA: Diagnosis not present

## 2021-08-20 DIAGNOSIS — N2581 Secondary hyperparathyroidism of renal origin: Secondary | ICD-10-CM | POA: Diagnosis not present

## 2021-08-20 DIAGNOSIS — N186 End stage renal disease: Secondary | ICD-10-CM | POA: Diagnosis not present

## 2021-08-20 DIAGNOSIS — D631 Anemia in chronic kidney disease: Secondary | ICD-10-CM | POA: Diagnosis not present

## 2021-08-21 DIAGNOSIS — K7460 Unspecified cirrhosis of liver: Secondary | ICD-10-CM | POA: Diagnosis not present

## 2021-08-22 DIAGNOSIS — N2581 Secondary hyperparathyroidism of renal origin: Secondary | ICD-10-CM | POA: Diagnosis not present

## 2021-08-22 DIAGNOSIS — D631 Anemia in chronic kidney disease: Secondary | ICD-10-CM | POA: Diagnosis not present

## 2021-08-22 DIAGNOSIS — Z992 Dependence on renal dialysis: Secondary | ICD-10-CM | POA: Diagnosis not present

## 2021-08-22 DIAGNOSIS — D509 Iron deficiency anemia, unspecified: Secondary | ICD-10-CM | POA: Diagnosis not present

## 2021-08-22 DIAGNOSIS — N186 End stage renal disease: Secondary | ICD-10-CM | POA: Diagnosis not present

## 2021-08-25 DIAGNOSIS — Z992 Dependence on renal dialysis: Secondary | ICD-10-CM | POA: Diagnosis not present

## 2021-08-25 DIAGNOSIS — N2581 Secondary hyperparathyroidism of renal origin: Secondary | ICD-10-CM | POA: Diagnosis not present

## 2021-08-25 DIAGNOSIS — N186 End stage renal disease: Secondary | ICD-10-CM | POA: Diagnosis not present

## 2021-08-25 DIAGNOSIS — D509 Iron deficiency anemia, unspecified: Secondary | ICD-10-CM | POA: Diagnosis not present

## 2021-08-25 DIAGNOSIS — D631 Anemia in chronic kidney disease: Secondary | ICD-10-CM | POA: Diagnosis not present

## 2021-08-26 ENCOUNTER — Ambulatory Visit: Payer: Medicare Other

## 2021-08-26 ENCOUNTER — Ambulatory Visit: Payer: Medicare Other | Attending: Adult Health

## 2021-08-26 DIAGNOSIS — R293 Abnormal posture: Secondary | ICD-10-CM | POA: Diagnosis not present

## 2021-08-26 DIAGNOSIS — R278 Other lack of coordination: Secondary | ICD-10-CM | POA: Diagnosis not present

## 2021-08-26 DIAGNOSIS — M25671 Stiffness of right ankle, not elsewhere classified: Secondary | ICD-10-CM | POA: Diagnosis not present

## 2021-08-26 DIAGNOSIS — R2689 Other abnormalities of gait and mobility: Secondary | ICD-10-CM | POA: Insufficient documentation

## 2021-08-26 DIAGNOSIS — R29818 Other symptoms and signs involving the nervous system: Secondary | ICD-10-CM | POA: Diagnosis not present

## 2021-08-26 DIAGNOSIS — Z03818 Encounter for observation for suspected exposure to other biological agents ruled out: Secondary | ICD-10-CM | POA: Diagnosis not present

## 2021-08-26 DIAGNOSIS — R0989 Other specified symptoms and signs involving the circulatory and respiratory systems: Secondary | ICD-10-CM | POA: Diagnosis not present

## 2021-08-26 DIAGNOSIS — J3489 Other specified disorders of nose and nasal sinuses: Secondary | ICD-10-CM | POA: Diagnosis not present

## 2021-08-26 DIAGNOSIS — G8191 Hemiplegia, unspecified affecting right dominant side: Secondary | ICD-10-CM | POA: Insufficient documentation

## 2021-08-26 DIAGNOSIS — J384 Edema of larynx: Secondary | ICD-10-CM | POA: Diagnosis not present

## 2021-08-26 DIAGNOSIS — R2681 Unsteadiness on feet: Secondary | ICD-10-CM | POA: Insufficient documentation

## 2021-08-26 DIAGNOSIS — G8110 Spastic hemiplegia affecting unspecified side: Secondary | ICD-10-CM | POA: Diagnosis not present

## 2021-08-26 DIAGNOSIS — L299 Pruritus, unspecified: Secondary | ICD-10-CM | POA: Diagnosis not present

## 2021-08-26 DIAGNOSIS — M6281 Muscle weakness (generalized): Secondary | ICD-10-CM | POA: Insufficient documentation

## 2021-08-26 DIAGNOSIS — Z20822 Contact with and (suspected) exposure to covid-19: Secondary | ICD-10-CM | POA: Diagnosis not present

## 2021-08-26 DIAGNOSIS — R0982 Postnasal drip: Secondary | ICD-10-CM | POA: Diagnosis not present

## 2021-08-26 NOTE — Therapy (Signed)
OUTPATIENT PHYSICAL THERAPY NEURO EVALUATION   Patient Name: Katie Nunez MRN: [id number] DOB:1956/01/02, 66 y.o., female Today's Date: 08/26/2021   PCP: [name] REFERRING PROVIDER: Frann Rider    Past Medical History:  Diagnosis Date   Achalasia    Anxiety    Dysplasia of cervix, low grade (CIN 1)    Environmental allergies    "all year long" (12/27/2016)   ESRD (end stage renal disease) on dialysis ([place])    "TTS; [place]" (12/27/2016)   Fibromyalgia    GERD (gastroesophageal reflux disease)    Gout    Hypertension    IBS (irritable bowel syndrome)    MVP (mitral valve prolapse)    RA (rheumatoid arthritis) ([place])    FOLLOWED BY DR. SHANAHAN   Raynaud's disease    Scleroderma ([place])    Seasonal allergies    Thrombocytopenia ([place]) 07/01/2016   Acute fall to 13,000 07/01/16   Tubular adenoma 01/08/2008   CECUM   Vitamin D deficiency    Past Surgical History:  Procedure Laterality Date   ANKLE FRACTURE SURGERY Right    AV FISTULA PLACEMENT Left 06/28/2016   Procedure: left arm ARTERIOVENOUS (AV) FISTULA CREATION;  Surgeon: Rosetta Posner, MD;  Location: [facility];  Service: Vascular;  Laterality: Left;   [place] Left 09/27/2016   Procedure: LEFT UPPER ARM CEPHALIC VEIN TRANSPOSITION;  Surgeon: Rosetta Posner, MD;  Location: [facility];  Service: Vascular;  Laterality: Left;   BREAST BIOPSY     "? side"   [place]     COLONOSCOPY W/ BIOPSIES  01/08/2008   INSERTION OF DIALYSIS CATHETER Right 06/28/2016   Procedure: INSERTION OF DIALYSIS CATHETER, right internal jugular;  Surgeon: Rosetta Posner, MD;  Location: [facility];  Service: Vascular;  Laterality: Right;   MYOMECTOMY     NASAL ENDOSCOPY WITH EPISTAXIS CONTROL N/A 12/29/2019   Procedure: NASAL ENDOSCOPY WITH EPISTAXIS CONTROL;  Surgeon: Leta Baptist, MD;  Location: [facility];  Service: ENT;  Laterality: N/A;   PELVIC LAPAROSCOPY  2011   superficial thrombophlebitis  Left 07-2014   Patient Active Problem List   Diagnosis Date Noted   Neck pain 10/30/2020   Knee locking, left 10/05/2020   Abnormality of gait 02/25/2020   Protein-calorie malnutrition, severe 01/19/2020   Epistaxis    Sleep disturbance    Slow transit constipation    Hemorrhoids    Chronic systolic congestive heart failure ([place])    ESRD on dialysis ([place])    Right hemiparesis ([place])    Pressure injury of skin 12/20/2019   Intraparenchymal hemorrhage of brain ([place]) 12/18/2019   ICH (intracerebral hemorrhage) ([place]) 12/12/2019   Viral disease 08/22/2019   Chronic right-sided heart failure ([place]) 07/09/2019   Supraventricular tachycardia ([place]) 07/09/2019   Other cirrhosis of liver ([place]) 06/27/2019   Heart failure ([place]) 02/21/2019   Heart palpitations 08/14/2018   Other fluid overload 07/27/2018   Encounter for long-term (current) use of other medications 01/18/2018   Hypothyroidism 01/18/2018   Myalgia and myositis 01/18/2018   Chronic nephritis 01/18/2018   Other long term (current) drug therapy 01/18/2018   Sleep apnea 01/18/2018   Unspecified persistent mental disorders due to conditions classified elsewhere 01/18/2018   Venous reflux 01/18/2018   Vitamin B12 deficiency 01/18/2018   Vitamin D deficiency 01/18/2018   Obstructive lung disease ([place]) 12/30/2017   Other pruritus 12/27/2017   Eruption cyst 12/19/2017   Pulmonary artery hypertension associated  with connective tissue disease ([place]) 11/28/2017   Chronic cough 11/11/2017   Pulmonary hypertension ([place]) 11/11/2017   Pulmonary arterial hypertension ([place]) 10/07/2017   Acute ITP ([place]) 01/05/2017   ESRD (end stage renal disease) ([place]) 12/28/2016   Encounter for removal of sutures 09/09/2016   Coagulation defect, unspecified ([place]) 09/01/2016   Underimmunization status 08/05/2016   Hemolytic anemia ([place]) 07/26/2016   Epistaxis, recurrent 07/26/2016   Unspecified protein-calorie malnutrition ([place]) 07/13/2016   Aftercare  including intermittent dialysis ([place]) 07/07/2016   Anemia in chronic kidney disease 07/07/2016   Hypokalemia 07/07/2016   Iron deficiency anemia, unspecified 07/07/2016   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   Nonrheumatic mitral (valve) prolapse 07/07/2016   Other irritable bowel syndrome 07/07/2016   Other secondary thrombocytopenia 07/07/2016   Secondary hyperparathyroidism of renal origin ([place]) 07/07/2016   Thrombocytopenia ([place]) 07/01/2016   ARF (acute renal failure) ([place]) 06/25/2016   Anxiety 06/25/2016   Bilateral carpal tunnel syndrome 10/22/2015   Chronic gout without tophus 10/22/2015   Chronic nonintractable headache 10/08/2015   Fibroid uterus 01/03/2012   H/O vitamin D deficiency 01/03/2012   Post-menopausal 01/03/2012   Hereditary and idiopathic peripheral neuropathy 11/19/2011   Intestinal malabsorption 78/67/6720   Lichen planus 94/70/9628   Low back pain 11/19/2011   Diffuse spasm of esophagus 11/11/2011   ESR raised 11/11/2011   Postinflammatory pulmonary fibrosis ([place]) 11/11/2011   Scleroderma ([place]) 11/16/2010   Rheumatoid arthritis ([place]) 11/16/2010   Raynaud's disease 11/16/2010   Symptomatic menopausal or female climacteric states 11/16/2010    ONSET DATE: 12/12/19  REFERRING DIAG: G81.10, R26.89, G81.91   THERAPY DIAG:  No diagnosis found.  Rationale for Evaluation and Treatment Rehabilitation  SUBJECTIVE:                                                                                                                                                                                              SUBJECTIVE STATEMENT: I feel like up in my R hip I can feel it pulling and I feel like my foot is dragging. My toes get numb some, and my R leg gets tired and I fatigue quickly.   Pt accompanied by: self  PERTINENT HISTORY: Raynaud's disease, rheumatoid arthritis, ESRD on dialysis Tuesday/Thursday/Saturday, scleroderma,   PAIN:  Are you having pain? Yes: NPRS  scale: 8/10 Pain location: R leg from knee into foot Pain description: R leg nerve pain, numbness, sharp, quick Aggravating factors: walking, stairs Relieving factors: meds sometimes but I try not to take them, not moving  PRECAUTIONS: Fall  WEIGHT BEARING RESTRICTIONS No  FALLS: Has patient fallen in last 6 months? No  LIVING ENVIRONMENT: Lives with: lives with their family Lives in: House/apartment Stairs: Yes: Internal: 15 steps; on right going up and on left going up and External: 3 steps; bilateral but cannot reach both Has following equipment at home: Single point cane and Walker - 4 wheeled  PLOF: Independent, [place] with basic ADLs, Independent with gait, and Independent with transfers  PATIENT GOALS to walk better, get stronger and not be so tired  OBJECTIVE:   DIAGNOSTIC FINDINGS:  IMPRESSION: 10/22/20 1. Mild intrasubstance degeneration of the menisci without well-defined tear. 2. Otherwise, unremarkable MRI of the left knee.  COGNITION: Overall cognitive status: Within functional limits for tasks assessed   SENSATION: WFL  MUSCLE TONE: RLE: Mild   MUSCLE LENGTH: Hamstrings: Bilateral tightness, more on R side   POSTURE: rounded shoulders, forward head, increased thoracic kyphosis, flexed trunk , and weight shift left  LOWER EXTREMITY ROM:     Active  Right Eval Left Eval  Hip flexion Limited due to weakness  [facility]  Hip extension    Hip abduction    Hip adduction    Hip internal rotation    Hip external rotation    Knee flexion 110d WNL  Knee extension WNL WNL  Ankle dorsiflexion neutral 8d  Ankle plantarflexion WFL WNL  Ankle inversion    Ankle eversion     (Blank rows = not tested)  LOWER EXTREMITY MMT:    MMT Right Eval Left Eval  Hip flexion 2+ 3+  Hip extension    Hip abduction    Hip adduction    Hip internal rotation 3+ 4  Hip external rotation 3+ 4  Knee flexion 3+ 4  Knee extension 3+ 4  Ankle dorsiflexion 3- 4   Ankle plantarflexion 3 5  Ankle inversion 3 4  Ankle eversion 3- 4  (Blank rows = not tested)   TRANSFERS: Assistive device utilized: Single point cane  Sit to stand: Modified independence Stand to sit: Modified independence Chair to chair: Modified independence   GAIT: Gait pattern: decreased arm swing- Right, decreased step length- Right, decreased stance time- Right, decreased stride length, decreased hip/knee flexion- Right, decreased ankle dorsiflexion- Right, Left hip hike, shuffling, ataxic, trendelenburg, lateral lean- Left, decreased trunk rotation, wide BOS, and poor foot clearance- Right Distance walked: in clinic distances Assistive device utilized: Single point cane Level of assistance: Modified independence   FUNCTIONAL TESTs:  5 times sit to stand: 22.71s 3reps, unable to complete 5 reps  Timed up and go (TUG): 21.28s  PATIENT SURVEYS:  FOTO 45  TODAY'S TREATMENT:  08/26/21 Eval and POC   PATIENT EDUCATION: Education details: POC  Person educated: Patient Education method: Explanation Education comprehension: verbalized understanding   HOME EXERCISE PROGRAM: TBD    GOALS: Goals reviewed with patient? Yes  SHORT TERM GOALS: Target date: 10/07/21  Patient will be independent with initial HEP. Goal status: INITIAL  2.  Patient will be educated on strategies to decrease risk of falls.  Goal status: INITIAL   LONG TERM GOALS: Target date: 11/25/21  Patient will be independent with advanced/ongoing HEP to improve outcomes and carryover.  Goal status: INITIAL  2.  Patient will be able to ambulate 500' with LRAD and up and down curbs with good safety to access community.  Goal status: INITIAL   3.  Patient will demonstrate improved functional LE strength as demonstrated by 4/5 in all muscle groups. Goal status: INITIAL  4.  Patient will report 16 on FOTO to demonstrate improved functional ability.  Baseline: 45 Goal status: INITIAL  5.   Patient will demonstrate decreased fall risk by scoring < 15 sec on TUG. Baseline: 21.28s Goal status: INITIAL  6.   Patient will demonstrate decreased fall risk and increased LE functional strength by scoring < 35sec on 5xSTS. Baseline: unable to complete test, 22.71s 3reps  Goal status: INITIAL  ASSESSMENT:  CLINICAL IMPRESSION: Patient is a 66 y.o. female who was seen today for physical therapy evaluation and treatment for post stroke care. She had her stroke back in December 2021, received PT in the hospital, home, and outpatient until May 2022. Went back for some neck pain and was receiving pelvic health until June 2023. She is back in therapy for gait and mobility impairments. Patient presents with LE weakness, balance deficits, and ataxic gait. She has R sided hemiparesis and is walking with SPC. She is very motivated and willing to do the work to get better. She will benefit from skilled PT intervention to address her stated impairments to be able to return to PLOF and complete ADLs without difficulty.    OBJECTIVE IMPAIRMENTS Abnormal gait, decreased balance, decreased coordination, decreased mobility, difficulty walking, decreased ROM, decreased strength, increased fascial restrictions, impaired UE functional use, improper body mechanics, and pain.   ACTIVITY LIMITATIONS carrying, lifting, bending, standing, stairs, transfers, and locomotion level  PARTICIPATION LIMITATIONS: cleaning, laundry, shopping, and community activity  PERSONAL FACTORS Past/current experiences, Time since onset of injury/illness/exacerbation, and 3+ comorbidities: pulmonary HTN, hx of stroke, scleroderma, rheumatoid arthritis, ESRD  are also affecting patient's functional outcome.   REHAB POTENTIAL: Good  CLINICAL DECISION MAKING: Evolving/moderate complexity  EVALUATION COMPLEXITY: Moderate  PLAN: PT FREQUENCY: 1-2x/week  PT DURATION: 12 weeks  PLANNED INTERVENTIONS: Therapeutic exercises,  Therapeutic activity, Neuromuscular re-education, Balance training, Gait training, Patient/Family education, Self Care, Joint mobilization, Stair training, Dry Needling, Electrical stimulation, Cryotherapy, Moist heat, Taping, Vasopneumatic device, Ionotophoresis 78m/ml Dexamethasone, and Manual therapy  PLAN FOR NEXT SESSION: low level strength training, stretching,    MAndris Baumann PT 08/26/2021, 8:27 AM

## 2021-08-27 DIAGNOSIS — Z992 Dependence on renal dialysis: Secondary | ICD-10-CM | POA: Diagnosis not present

## 2021-08-27 DIAGNOSIS — D631 Anemia in chronic kidney disease: Secondary | ICD-10-CM | POA: Diagnosis not present

## 2021-08-27 DIAGNOSIS — N2581 Secondary hyperparathyroidism of renal origin: Secondary | ICD-10-CM | POA: Diagnosis not present

## 2021-08-27 DIAGNOSIS — D509 Iron deficiency anemia, unspecified: Secondary | ICD-10-CM | POA: Diagnosis not present

## 2021-08-27 DIAGNOSIS — N186 End stage renal disease: Secondary | ICD-10-CM | POA: Diagnosis not present

## 2021-08-29 DIAGNOSIS — Z992 Dependence on renal dialysis: Secondary | ICD-10-CM | POA: Diagnosis not present

## 2021-08-29 DIAGNOSIS — N2581 Secondary hyperparathyroidism of renal origin: Secondary | ICD-10-CM | POA: Diagnosis not present

## 2021-08-29 DIAGNOSIS — D509 Iron deficiency anemia, unspecified: Secondary | ICD-10-CM | POA: Diagnosis not present

## 2021-08-29 DIAGNOSIS — D631 Anemia in chronic kidney disease: Secondary | ICD-10-CM | POA: Diagnosis not present

## 2021-08-29 DIAGNOSIS — N186 End stage renal disease: Secondary | ICD-10-CM | POA: Diagnosis not present

## 2021-09-01 DIAGNOSIS — N186 End stage renal disease: Secondary | ICD-10-CM | POA: Diagnosis not present

## 2021-09-01 DIAGNOSIS — D509 Iron deficiency anemia, unspecified: Secondary | ICD-10-CM | POA: Diagnosis not present

## 2021-09-01 DIAGNOSIS — D631 Anemia in chronic kidney disease: Secondary | ICD-10-CM | POA: Diagnosis not present

## 2021-09-01 DIAGNOSIS — N2581 Secondary hyperparathyroidism of renal origin: Secondary | ICD-10-CM | POA: Diagnosis not present

## 2021-09-01 DIAGNOSIS — Z992 Dependence on renal dialysis: Secondary | ICD-10-CM | POA: Diagnosis not present

## 2021-09-01 NOTE — Therapy (Signed)
OUTPATIENT PHYSICAL THERAPY NEURO TREATMENT   Patient Name: Katie Nunez MRN: [id number] DOB:05/17/1955, 66 y.o., female Today's Date: 09/02/2021   PCP: [name] REFERRING PROVIDER: Frann Rider   PT End of Session - 09/02/21 1022     Visit Number 2    Date for PT Re-Evaluation 11/25/21    Authorization Type [employer] Medicare    PT Start Time 1021    PT Stop Time 1101    PT Time Calculation (min) 40 min    Activity Tolerance Patient tolerated treatment well;Patient limited by fatigue    Behavior During Therapy [facility] for tasks assessed/performed             Past Medical History:  Diagnosis Date   Achalasia    Anxiety    Dysplasia of cervix, low grade (CIN 1)    Environmental allergies    "all year long" (12/27/2016)   ESRD (end stage renal disease) on dialysis ([place])    "TTS; [place]" (12/27/2016)   Fibromyalgia    GERD (gastroesophageal reflux disease)    Gout    Hypertension    IBS (irritable bowel syndrome)    MVP (mitral valve prolapse)    RA (rheumatoid arthritis) ([place])    FOLLOWED BY DR. SHANAHAN   Raynaud's disease    Scleroderma ([place])    Seasonal allergies    Thrombocytopenia ([place]) 07/01/2016   Acute fall to 13,000 07/01/16   Tubular adenoma 01/08/2008   CECUM   Vitamin D deficiency    Past Surgical History:  Procedure Laterality Date   ANKLE FRACTURE SURGERY Right    AV FISTULA PLACEMENT Left 06/28/2016   Procedure: left arm ARTERIOVENOUS (AV) FISTULA CREATION;  Surgeon: Rosetta Posner, MD;  Location: [facility] OR;  Service: Vascular;  Laterality: Left;   [place] Left 09/27/2016   Procedure: LEFT UPPER ARM CEPHALIC VEIN TRANSPOSITION;  Surgeon: Rosetta Posner, MD;  Location: [facility];  Service: Vascular;  Laterality: Left;   BREAST BIOPSY     "? side"   [place]     COLONOSCOPY W/ BIOPSIES  01/08/2008   INSERTION OF DIALYSIS CATHETER Right 06/28/2016   Procedure: INSERTION OF DIALYSIS CATHETER,  right internal jugular;  Surgeon: Rosetta Posner, MD;  Location: [facility];  Service: Vascular;  Laterality: Right;   MYOMECTOMY     NASAL ENDOSCOPY WITH EPISTAXIS CONTROL N/A 12/29/2019   Procedure: NASAL ENDOSCOPY WITH EPISTAXIS CONTROL;  Surgeon: Leta Baptist, MD;  Location: [facility];  Service: ENT;  Laterality: N/A;   PELVIC LAPAROSCOPY  2011   superficial thrombophlebitis Left 07-2014   Patient Active Problem List   Diagnosis Date Noted   Neck pain 10/30/2020   Knee locking, left 10/05/2020   Abnormality of gait 02/25/2020   Protein-calorie malnutrition, severe 01/19/2020   Epistaxis    Sleep disturbance    Slow transit constipation    Hemorrhoids    Chronic systolic congestive heart failure ([place])    ESRD on dialysis ([place])    Right hemiparesis ([place])    Pressure injury of skin 12/20/2019   Intraparenchymal hemorrhage of brain ([place]) 12/18/2019   ICH (intracerebral hemorrhage) ([place]) 12/12/2019   Viral disease 08/22/2019   Chronic right-sided heart failure ([place]) 07/09/2019   Supraventricular tachycardia ([place]) 07/09/2019   Other cirrhosis of liver ([place]) 06/27/2019   Heart failure ([place]) 02/21/2019   Heart palpitations 08/14/2018   Other fluid overload 07/27/2018   Encounter for long-term (current) use  of other medications 01/18/2018   Hypothyroidism 01/18/2018   Myalgia and myositis 01/18/2018   Chronic nephritis 01/18/2018   Other long term (current) drug therapy 01/18/2018   Sleep apnea 01/18/2018   Unspecified persistent mental disorders due to conditions classified elsewhere 01/18/2018   Venous reflux 01/18/2018   Vitamin B12 deficiency 01/18/2018   Vitamin D deficiency 01/18/2018   Obstructive lung disease ([place]) 12/30/2017   Other pruritus 12/27/2017   Eruption cyst 12/19/2017   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 ([place]) 11/28/2017   Chronic cough 11/11/2017   Pulmonary hypertension ([place]) 11/11/2017   Pulmonary arterial hypertension ([place])  10/07/2017   Acute ITP ([place]) 01/05/2017   ESRD (end stage renal disease) ([place]) 12/28/2016   Encounter for removal of sutures 09/09/2016   Coagulation defect, unspecified ([place]) 09/01/2016   Underimmunization status 08/05/2016   Hemolytic anemia ([place]) 07/26/2016   Epistaxis, recurrent 07/26/2016   Unspecified protein-calorie malnutrition ([place]) 07/13/2016   Aftercare including intermittent dialysis ([place]) 07/07/2016   Anemia in chronic kidney disease 07/07/2016   Hypokalemia 07/07/2016   Iron deficiency anemia, unspecified 07/07/2016   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   Nonrheumatic mitral (valve) prolapse 07/07/2016   Other irritable bowel syndrome 07/07/2016   Other secondary thrombocytopenia 07/07/2016   Secondary hyperparathyroidism of renal origin ([place]) 07/07/2016   Thrombocytopenia ([place]) 07/01/2016   ARF (acute renal failure) ([place]) 06/25/2016   Anxiety 06/25/2016   Bilateral carpal tunnel syndrome 10/22/2015   Chronic gout without tophus 10/22/2015   Chronic nonintractable headache 10/08/2015   Fibroid uterus 01/03/2012   H/O vitamin D deficiency 01/03/2012   Post-menopausal 01/03/2012   Hereditary and idiopathic peripheral neuropathy 11/19/2011   Intestinal malabsorption 69/79/4801   Lichen planus 65/53/7482   Low back pain 11/19/2011   Diffuse spasm of esophagus 11/11/2011   ESR raised 11/11/2011   Postinflammatory pulmonary fibrosis ([place]) 11/11/2011   Scleroderma ([place]) 11/16/2010   Rheumatoid arthritis ([place]) 11/16/2010   Raynaud's disease 11/16/2010   Symptomatic menopausal or female climacteric states 11/16/2010    ONSET DATE: 12/12/19  REFERRING DIAG: G81.10, R26.89, G81.91   THERAPY DIAG:  Muscle weakness (generalized)  Other abnormalities of gait and mobility  Other lack of coordination  Unsteadiness on feet  Abnormal posture  Other symptoms and signs involving the nervous system  Rationale for Evaluation and Treatment Rehabilitation  SUBJECTIVE:                                                                                                                                                                                               SUBJECTIVE STATEMENT: I am moving slow today, my  R hip feels tight.   Pt accompanied by: self  PERTINENT HISTORY: Raynaud's disease, rheumatoid arthritis, ESRD on dialysis Tuesday/Thursday/Saturday, scleroderma,   PAIN:  Are you having pain? Yes: NPRS scale: 8/10 Pain location: R leg from knee into foot Pain description: R leg nerve pain, numbness, sharp, quick Aggravating factors: walking, stairs Relieving factors: meds sometimes but I try not to take them, not moving  PRECAUTIONS: Fall  WEIGHT BEARING RESTRICTIONS No  FALLS: Has patient fallen in last 6 months? No  LIVING ENVIRONMENT: Lives with: lives with their family Lives in: House/apartment Stairs: Yes: Internal: 15 steps; on right going up and on left going up and External: 3 steps; bilateral but cannot reach both Has following equipment at home: Single point cane and Walker - 4 wheeled  PLOF: Independent, [place] with basic ADLs, Independent with gait, and Independent with transfers  PATIENT GOALS to walk better, get stronger and not be so tired  OBJECTIVE:   DIAGNOSTIC FINDINGS:  IMPRESSION: 10/22/20 1. Mild intrasubstance degeneration of the menisci without well-defined tear. 2. Otherwise, unremarkable MRI of the left knee.  COGNITION: Overall cognitive status: Within functional limits for tasks assessed   SENSATION: WFL  MUSCLE TONE: RLE: Mild   MUSCLE LENGTH: Hamstrings: Bilateral tightness, more on R side   POSTURE: rounded shoulders, forward head, increased thoracic kyphosis, flexed trunk , and weight shift left  LOWER EXTREMITY ROM:     Active  Right Eval Left Eval  Hip flexion Limited due to weakness  [facility]  Hip extension    Hip abduction    Hip adduction    Hip internal rotation    Hip external rotation     Knee flexion 110d WNL  Knee extension WNL WNL  Ankle dorsiflexion neutral 8d  Ankle plantarflexion WFL WNL  Ankle inversion    Ankle eversion     (Blank rows = not tested)  LOWER EXTREMITY MMT:    MMT Right Eval Left Eval  Hip flexion 2+ 3+  Hip extension    Hip abduction    Hip adduction    Hip internal rotation 3+ 4  Hip external rotation 3+ 4  Knee flexion 3+ 4  Knee extension 3+ 4  Ankle dorsiflexion 3- 4  Ankle plantarflexion 3 5  Ankle inversion 3 4  Ankle eversion 3- 4  (Blank rows = not tested)   TRANSFERS: Assistive device utilized: Single point cane  Sit to stand: Modified independence Stand to sit: Modified independence Chair to chair: Modified independence   GAIT: Gait pattern: decreased arm swing- Right, decreased step length- Right, decreased stance time- Right, decreased stride length, decreased hip/knee flexion- Right, decreased ankle dorsiflexion- Right, Left hip hike, shuffling, ataxic, trendelenburg, lateral lean- Left, decreased trunk rotation, wide BOS, and poor foot clearance- Right Distance walked: in clinic distances Assistive device utilized: Single point cane Level of assistance: Modified independence   FUNCTIONAL TESTs:  5 times sit to stand: 22.71s 3reps, unable to complete 5 reps  Timed up and go (TUG): 21.28s  PATIENT SURVEYS:  FOTO 45  TODAY'S TREATMENT:  09/02/21 Nustep L3 x24mns  STS x10 LAQ R 2# 2x10  HS curls R yellowTB HS stretch and SK2C 30s BLE Bridges 2x10  SLR R only 2x10    PATIENT EDUCATION: Education details: POC  Person educated: Patient Education method: Explanation Education comprehension: verbalized understanding   HOME EXERCISE PROGRAM: TBD    GOALS: Goals reviewed with patient? Yes  SHORT TERM GOALS: Target date: 10/07/21  Patient will be  independent with initial HEP. Goal status: INITIAL  2.  Patient will be educated on strategies to decrease risk of falls.  Goal status:  INITIAL   LONG TERM GOALS: Target date: 11/25/21  Patient will be independent with advanced/ongoing HEP to improve outcomes and carryover.  Goal status: INITIAL  2.  Patient will be able to ambulate 500' with LRAD and up and down curbs with good safety to access community.  Goal status: INITIAL   3.  Patient will demonstrate improved functional LE strength as demonstrated by 4/5 in all muscle groups. Goal status: INITIAL  4.  Patient will report 71 on FOTO to demonstrate improved functional ability. Baseline: 45 Goal status: INITIAL  5.  Patient will demonstrate decreased fall risk by scoring < 15 sec on TUG. Baseline: 21.28s Goal status: INITIAL  6.   Patient will demonstrate decreased fall risk and increased LE functional strength by scoring < 35sec on 5xSTS. Baseline: unable to complete test, 22.71s 3reps  Goal status: INITIAL  ASSESSMENT:  CLINICAL IMPRESSION: Patient return after eval for first PT session. Requires intermittent breaks on the Nustep due to some SOB. We worked on mobility and strength today. She is very motivated and does well in session today, some low back pain with SLR but states she is able to do it. Difficulty with HS curls, unable to do with higher resistance. She requires breaks throughout session due to being winded from pulmonary hypertension.    OBJECTIVE IMPAIRMENTS Abnormal gait, decreased balance, decreased coordination, decreased mobility, difficulty walking, decreased ROM, decreased strength, increased fascial restrictions, impaired UE functional use, improper body mechanics, and pain.   ACTIVITY LIMITATIONS carrying, lifting, bending, standing, stairs, transfers, and locomotion level  PARTICIPATION LIMITATIONS: cleaning, laundry, shopping, and community activity  PERSONAL FACTORS Past/current experiences, Time since onset of injury/illness/exacerbation, and 3+ comorbidities: pulmonary HTN, hx of stroke, scleroderma, rheumatoid arthritis, ESRD   are also affecting patient's functional outcome.   REHAB POTENTIAL: Good  CLINICAL DECISION MAKING: Evolving/moderate complexity  EVALUATION COMPLEXITY: Moderate  PLAN: PT FREQUENCY: 1-2x/week  PT DURATION: 12 weeks  PLANNED INTERVENTIONS: Therapeutic exercises, Therapeutic activity, Neuromuscular re-education, Balance training, Gait training, Patient/Family education, Self Care, Joint mobilization, Stair training, Dry Needling, Electrical stimulation, Cryotherapy, Moist heat, Taping, Vasopneumatic device, Ionotophoresis 61m/ml Dexamethasone, and Manual therapy  PLAN FOR NEXT SESSION: low level strength training, stretching,    MAndris Baumann PT 09/02/2021, 11:03 AM

## 2021-09-02 ENCOUNTER — Ambulatory Visit: Payer: Medicare Other

## 2021-09-02 DIAGNOSIS — G8191 Hemiplegia, unspecified affecting right dominant side: Secondary | ICD-10-CM | POA: Diagnosis not present

## 2021-09-02 DIAGNOSIS — R29818 Other symptoms and signs involving the nervous system: Secondary | ICD-10-CM | POA: Diagnosis not present

## 2021-09-02 DIAGNOSIS — M6281 Muscle weakness (generalized): Secondary | ICD-10-CM | POA: Diagnosis not present

## 2021-09-02 DIAGNOSIS — R2681 Unsteadiness on feet: Secondary | ICD-10-CM

## 2021-09-02 DIAGNOSIS — R278 Other lack of coordination: Secondary | ICD-10-CM

## 2021-09-02 DIAGNOSIS — G8110 Spastic hemiplegia affecting unspecified side: Secondary | ICD-10-CM | POA: Diagnosis not present

## 2021-09-02 DIAGNOSIS — R2689 Other abnormalities of gait and mobility: Secondary | ICD-10-CM | POA: Diagnosis not present

## 2021-09-02 DIAGNOSIS — R293 Abnormal posture: Secondary | ICD-10-CM

## 2021-09-02 DIAGNOSIS — M25671 Stiffness of right ankle, not elsewhere classified: Secondary | ICD-10-CM | POA: Diagnosis not present

## 2021-09-03 DIAGNOSIS — D509 Iron deficiency anemia, unspecified: Secondary | ICD-10-CM | POA: Diagnosis not present

## 2021-09-03 DIAGNOSIS — N2581 Secondary hyperparathyroidism of renal origin: Secondary | ICD-10-CM | POA: Diagnosis not present

## 2021-09-03 DIAGNOSIS — Z992 Dependence on renal dialysis: Secondary | ICD-10-CM | POA: Diagnosis not present

## 2021-09-03 DIAGNOSIS — N186 End stage renal disease: Secondary | ICD-10-CM | POA: Diagnosis not present

## 2021-09-03 DIAGNOSIS — D631 Anemia in chronic kidney disease: Secondary | ICD-10-CM | POA: Diagnosis not present

## 2021-09-05 DIAGNOSIS — Z992 Dependence on renal dialysis: Secondary | ICD-10-CM | POA: Diagnosis not present

## 2021-09-05 DIAGNOSIS — N186 End stage renal disease: Secondary | ICD-10-CM | POA: Diagnosis not present

## 2021-09-05 DIAGNOSIS — D631 Anemia in chronic kidney disease: Secondary | ICD-10-CM | POA: Diagnosis not present

## 2021-09-05 DIAGNOSIS — D509 Iron deficiency anemia, unspecified: Secondary | ICD-10-CM | POA: Diagnosis not present

## 2021-09-05 DIAGNOSIS — N2581 Secondary hyperparathyroidism of renal origin: Secondary | ICD-10-CM | POA: Diagnosis not present

## 2021-09-08 DIAGNOSIS — D631 Anemia in chronic kidney disease: Secondary | ICD-10-CM | POA: Diagnosis not present

## 2021-09-08 DIAGNOSIS — N186 End stage renal disease: Secondary | ICD-10-CM | POA: Diagnosis not present

## 2021-09-08 DIAGNOSIS — D509 Iron deficiency anemia, unspecified: Secondary | ICD-10-CM | POA: Diagnosis not present

## 2021-09-08 DIAGNOSIS — M349 Systemic sclerosis, unspecified: Secondary | ICD-10-CM | POA: Diagnosis not present

## 2021-09-08 DIAGNOSIS — Z992 Dependence on renal dialysis: Secondary | ICD-10-CM | POA: Diagnosis not present

## 2021-09-08 DIAGNOSIS — N2581 Secondary hyperparathyroidism of renal origin: Secondary | ICD-10-CM | POA: Diagnosis not present

## 2021-09-08 DIAGNOSIS — M0579 Rheumatoid arthritis with rheumatoid factor of multiple sites without organ or systems involvement: Secondary | ICD-10-CM | POA: Diagnosis not present

## 2021-09-09 ENCOUNTER — Ambulatory Visit: Payer: Medicare Other | Admitting: Physical Therapy

## 2021-09-09 DIAGNOSIS — R2681 Unsteadiness on feet: Secondary | ICD-10-CM | POA: Diagnosis not present

## 2021-09-09 DIAGNOSIS — G8110 Spastic hemiplegia affecting unspecified side: Secondary | ICD-10-CM | POA: Diagnosis not present

## 2021-09-09 DIAGNOSIS — M25671 Stiffness of right ankle, not elsewhere classified: Secondary | ICD-10-CM | POA: Diagnosis not present

## 2021-09-09 DIAGNOSIS — M6281 Muscle weakness (generalized): Secondary | ICD-10-CM | POA: Diagnosis not present

## 2021-09-09 DIAGNOSIS — R2689 Other abnormalities of gait and mobility: Secondary | ICD-10-CM

## 2021-09-09 DIAGNOSIS — R278 Other lack of coordination: Secondary | ICD-10-CM

## 2021-09-09 DIAGNOSIS — R29818 Other symptoms and signs involving the nervous system: Secondary | ICD-10-CM | POA: Diagnosis not present

## 2021-09-09 DIAGNOSIS — G8191 Hemiplegia, unspecified affecting right dominant side: Secondary | ICD-10-CM | POA: Diagnosis not present

## 2021-09-09 DIAGNOSIS — R293 Abnormal posture: Secondary | ICD-10-CM | POA: Diagnosis not present

## 2021-09-09 NOTE — Therapy (Signed)
OUTPATIENT PHYSICAL THERAPY TREATMENT NOTE   Patient Name: Katie Nunez MRN: [id number] DOB:05-18-1955, 66 y.o., female Today's Date: 09/09/2021  PCP:  Loistine Chance PROVIDER: [name]  END OF SESSION:    Past Medical History:  Diagnosis Date   Achalasia    Anxiety    Dysplasia of cervix, low grade (CIN 1)    Environmental allergies    "all year long" (12/27/2016)   ESRD (end stage renal disease) on dialysis ([place])    "TTS; [place]" (12/27/2016)   Fibromyalgia    GERD (gastroesophageal reflux disease)    Gout    Hypertension    IBS (irritable bowel syndrome)    MVP (mitral valve prolapse)    RA (rheumatoid arthritis) ([place])    FOLLOWED BY DR. SHANAHAN   Raynaud's disease    Scleroderma ([place])    Seasonal allergies    Thrombocytopenia ([place]) 07/01/2016   Acute fall to 13,000 07/01/16   Tubular adenoma 01/08/2008   CECUM   Vitamin D deficiency    Past Surgical History:  Procedure Laterality Date   ANKLE FRACTURE SURGERY Right    AV FISTULA PLACEMENT Left 06/28/2016   Procedure: left arm ARTERIOVENOUS (AV) FISTULA CREATION;  Surgeon: Rosetta Posner, MD;  Location: [facility];  Service: Vascular;  Laterality: Left;   [place] Left 09/27/2016   Procedure: LEFT UPPER ARM CEPHALIC VEIN TRANSPOSITION;  Surgeon: Rosetta Posner, MD;  Location: [facility];  Service: Vascular;  Laterality: Left;   BREAST BIOPSY     "? side"   [place]     COLONOSCOPY W/ BIOPSIES  01/08/2008   INSERTION OF DIALYSIS CATHETER Right 06/28/2016   Procedure: INSERTION OF DIALYSIS CATHETER, right internal jugular;  Surgeon: Rosetta Posner, MD;  Location: [facility];  Service: Vascular;  Laterality: Right;   MYOMECTOMY     NASAL ENDOSCOPY WITH EPISTAXIS CONTROL N/A 12/29/2019   Procedure: NASAL ENDOSCOPY WITH EPISTAXIS CONTROL;  Surgeon: Leta Baptist, MD;  Location: [facility];  Service: ENT;  Laterality: N/A;   PELVIC LAPAROSCOPY  2011   superficial thrombophlebitis  Left 07-2014   Patient Active Problem List   Diagnosis Date Noted   Neck pain 10/30/2020   Knee locking, left 10/05/2020   Abnormality of gait 02/25/2020   Protein-calorie malnutrition, severe 01/19/2020   Epistaxis    Sleep disturbance    Slow transit constipation    Hemorrhoids    Chronic systolic congestive heart failure ([place])    ESRD on dialysis ([place])    Right hemiparesis ([place])    Pressure injury of skin 12/20/2019   Intraparenchymal hemorrhage of brain ([place]) 12/18/2019   ICH (intracerebral hemorrhage) ([place]) 12/12/2019   Viral disease 08/22/2019   Chronic right-sided heart failure ([place]) 07/09/2019   Supraventricular tachycardia ([place]) 07/09/2019   Other cirrhosis of liver ([place]) 06/27/2019   Heart failure ([place]) 02/21/2019   Heart palpitations 08/14/2018   Other fluid overload 07/27/2018   Encounter for long-term (current) use of other medications 01/18/2018   Hypothyroidism 01/18/2018   Myalgia and myositis 01/18/2018   Chronic nephritis 01/18/2018   Other long term (current) drug therapy 01/18/2018   Sleep apnea 01/18/2018   Unspecified persistent mental disorders due to conditions classified elsewhere 01/18/2018   Venous reflux 01/18/2018   Vitamin B12 deficiency 01/18/2018   Vitamin D deficiency 01/18/2018   Obstructive lung disease ([place]) 12/30/2017   Other pruritus 12/27/2017   Eruption cyst 12/19/2017   Pulmonary artery  hypertension associated with connective tissue disease ([place]) 11/28/2017   Chronic cough 11/11/2017   Pulmonary hypertension ([place]) 11/11/2017   Pulmonary arterial hypertension ([place]) 10/07/2017   Acute ITP ([place]) 01/05/2017   ESRD (end stage renal disease) ([place]) 12/28/2016   Encounter for removal of sutures 09/09/2016   Coagulation defect, unspecified ([place]) 09/01/2016   Underimmunization status 08/05/2016   Hemolytic anemia ([place]) 07/26/2016   Epistaxis, recurrent 07/26/2016   Unspecified protein-calorie malnutrition ([place]) 07/13/2016   Aftercare  including intermittent dialysis ([place]) 07/07/2016   Anemia in chronic kidney disease 07/07/2016   Hypokalemia 07/07/2016   Iron deficiency anemia, unspecified 07/07/2016   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   Nonrheumatic mitral (valve) prolapse 07/07/2016   Other irritable bowel syndrome 07/07/2016   Other secondary thrombocytopenia 07/07/2016   Secondary hyperparathyroidism of renal origin ([place]) 07/07/2016   Thrombocytopenia ([place]) 07/01/2016   ARF (acute renal failure) ([place]) 06/25/2016   Anxiety 06/25/2016   Bilateral carpal tunnel syndrome 10/22/2015   Chronic gout without tophus 10/22/2015   Chronic nonintractable headache 10/08/2015   Fibroid uterus 01/03/2012   H/O vitamin D deficiency 01/03/2012   Post-menopausal 01/03/2012   Hereditary and idiopathic peripheral neuropathy 11/19/2011   Intestinal malabsorption 96/04/5407   Lichen planus 81/19/1478   Low back pain 11/19/2011   Diffuse spasm of esophagus 11/11/2011   ESR raised 11/11/2011   Postinflammatory pulmonary fibrosis ([place]) 11/11/2011   Scleroderma ([place]) 11/16/2010   Rheumatoid arthritis ([place]) 11/16/2010   Raynaud's disease 11/16/2010   Symptomatic menopausal or female climacteric states 11/16/2010      ONSET DATE: 12/12/19   REFERRING DIAG: G81.10, R26.89, G81.91    THERAPY DIAG:  Muscle weakness (generalized)   Other abnormalities of gait and mobility   Other lack of coordination   Unsteadiness on feet   Abnormal posture   Other symptoms and signs involving the nervous system   Rationale for Evaluation and Treatment Rehabilitation   SUBJECTIVE:                                                                                                                                                                                               SUBJECTIVE STATEMENT: Pt states that she is doing ok today. She would like some stuff to work on at home.   Pt accompanied by: self   PERTINENT HISTORY: Raynaud's disease,  rheumatoid arthritis, ESRD on dialysis Tuesday/Thursday/Saturday, scleroderma,    PAIN:  Are you having pain? Yes: NPRS scale: 8/10 Pain location: R leg from knee into foot Pain description: R leg nerve pain, numbness, sharp, quick Aggravating factors: walking, stairs Relieving factors: meds sometimes but I  try not to take them, not moving   PRECAUTIONS: Fall   WEIGHT BEARING RESTRICTIONS No   FALLS: Has patient fallen in last 6 months? No   LIVING ENVIRONMENT: Lives with: lives with their family Lives in: House/apartment Stairs: Yes: Internal: 15 steps; on right going up and on left going up and External: 3 steps; bilateral but cannot reach both Has following equipment at home: Single point cane and Walker - 4 wheeled   PLOF: Independent, [place] with basic ADLs, Independent with gait, and Independent with transfers   PATIENT GOALS to walk better, get stronger and not be so tired   OBJECTIVE:    DIAGNOSTIC FINDINGS:  IMPRESSION: 10/22/20 1. Mild intrasubstance degeneration of the menisci without well-defined tear. 2. Otherwise, unremarkable MRI of the left knee.   COGNITION: Overall cognitive status: Within functional limits for tasks assessed             SENSATION: WFL   MUSCLE TONE: RLE: Mild     MUSCLE LENGTH: Hamstrings: Bilateral tightness, more on R side     POSTURE: rounded shoulders, forward head, increased thoracic kyphosis, flexed trunk , and weight shift left   LOWER EXTREMITY ROM:      Active  Right Eval Left Eval  Hip flexion Limited due to weakness  [facility]  Hip extension      Hip abduction      Hip adduction      Hip internal rotation      Hip external rotation      Knee flexion 110d WNL  Knee extension WNL WNL  Ankle dorsiflexion neutral 8d  Ankle plantarflexion WFL WNL  Ankle inversion      Ankle eversion       (Blank rows = not tested)   LOWER EXTREMITY MMT:     MMT Right Eval Left Eval  Hip flexion 2+ 3+  Hip extension       Hip abduction      Hip adduction      Hip internal rotation 3+ 4  Hip external rotation 3+ 4  Knee flexion 3+ 4  Knee extension 3+ 4  Ankle dorsiflexion 3- 4  Ankle plantarflexion 3 5  Ankle inversion 3 4  Ankle eversion 3- 4  (Blank rows = not tested)     TRANSFERS: Assistive device utilized: Single point cane  Sit to stand: Modified independence Stand to sit: Modified independence Chair to chair: Modified independence     GAIT: Gait pattern: decreased arm swing- Right, decreased step length- Right, decreased stance time- Right, decreased stride length, decreased hip/knee flexion- Right, decreased ankle dorsiflexion- Right, Left hip hike, shuffling, ataxic, trendelenburg, lateral lean- Left, decreased trunk rotation, wide BOS, and poor foot clearance- Right Distance walked: in clinic distances Assistive device utilized: Single point cane Level of assistance: Modified independence     FUNCTIONAL TESTs:  5 times sit to stand: 22.71s 3reps, unable to complete 5 reps  Timed up and go (TUG): 21.28s   PATIENT SURVEYS:  FOTO 45   TODAY'S TREATMENT:  09/09/21: Rt gastroc stretch with strap 3x30 sec hold  Supine groin butterfly stretch 5x10 sec hold  BLE bridge x10 reps Rt SLR 3x5 reps- quad set prior to lift  Rt LE step ups 4", B HHA x10 reps  Standing hip abduction in // bars x5 reps each Standing heel/toe raises BUE support x10 reps   09/02/21 Nustep L3 x58mns  STS x10 LAQ R 2# 2x10  HS curls R yellowTB HS stretch and  SK2C 30s BLE Bridges 2x10  SLR R only 2x10      PATIENT EDUCATION: Education details: POC  Person educated: Patient Education method: Explanation Education comprehension: verbalized understanding     HOME EXERCISE PROGRAM: Access Code: [email or web address] URL: [email or web address] Date: 09/09/2021 Prepared by: [facility]  Exercises - Long Sitting Calf Stretch with Strap  - 2 x daily - 7 x  weekly - 3 sets - 30 seconds hold - Supine Butterfly Groin Stretch  - 2 x daily - 7 x weekly - 3 sets - 10 reps - Bridge  - 1 x daily - 7 x weekly - 2 sets - 10 reps - Small Range Straight Leg Raise (with brace locked in extension)  - 1 x daily - 7 x weekly - 2 sets - 10 reps       GOALS: Goals reviewed with patient? Yes   SHORT TERM GOALS: Target date: 10/07/21   Patient will be independent with initial HEP. Goal status: INITIAL   2.  Patient will be educated on strategies to decrease risk of falls.  Goal status: INITIAL     LONG TERM GOALS: Target date: 11/25/21   Patient will be independent with advanced/ongoing HEP to improve outcomes and carryover.  Goal status: INITIAL   2.  Patient will be able to ambulate 500' with LRAD and up and down curbs with good safety to access community.  Goal status: INITIAL    3.  Patient will demonstrate improved functional LE strength as demonstrated by 4/5 in all muscle groups. Goal status: INITIAL   4.  Patient will report 26 on FOTO to demonstrate improved functional ability. Baseline: 45 Goal status: INITIAL   5.  Patient will demonstrate decreased fall risk by scoring < 15 sec on TUG. Baseline: 21.28s Goal status: INITIAL   6.   Patient will demonstrate decreased fall risk and increased LE functional strength by scoring < 35sec on 5xSTS. Baseline: unable to complete test, 22.71s 3reps  Goal status: INITIAL   ASSESSMENT:   CLINICAL IMPRESSION: Today focused on improving Rt LE flexibility and strength. Pt notes some adductor discomfort with ambulation secondary to poor glut activation. PT provided a butterfly stretch to improve discomfort in this area. Pt was able to complete all mat table exercises with muscle fatigue. She had more difficulty with standing exercises, noting some pain in the Lt LE secondary to overcompensation on this side. PT provided an initial HEP and pt demonstrated understanding of this.      OBJECTIVE  IMPAIRMENTS Abnormal gait, decreased balance, decreased coordination, decreased mobility, difficulty walking, decreased ROM, decreased strength, increased fascial restrictions, impaired UE functional use, improper body mechanics, and pain.    ACTIVITY LIMITATIONS carrying, lifting, bending, standing, stairs, transfers, and locomotion level   PARTICIPATION LIMITATIONS: cleaning, laundry, shopping, and community activity   PERSONAL FACTORS Past/current experiences, Time since onset of injury/illness/exacerbation, and 3+ comorbidities: pulmonary HTN, hx of stroke, scleroderma, rheumatoid arthritis, ESRD  are also affecting patient's functional outcome.    REHAB POTENTIAL: Good   CLINICAL DECISION MAKING: Evolving/moderate complexity   EVALUATION COMPLEXITY: Moderate   PLAN: PT FREQUENCY: 1-2x/week   PT DURATION: 12 weeks   PLANNED INTERVENTIONS: Therapeutic exercises, Therapeutic activity, Neuromuscular re-education, Balance training, Gait training, Patient/Family education, Self Care, Joint mobilization, Stair training, Dry Needling, Electrical stimulation, Cryotherapy, Moist heat, Taping, Vasopneumatic device, Ionotophoresis 22m/ml Dexamethasone, and Manual therapy   PLAN FOR NEXT SESSION: low level strength training, stretching  12:35 PM,09/09/21 [place], [place] at [place]

## 2021-09-10 DIAGNOSIS — N186 End stage renal disease: Secondary | ICD-10-CM | POA: Diagnosis not present

## 2021-09-10 DIAGNOSIS — D631 Anemia in chronic kidney disease: Secondary | ICD-10-CM | POA: Diagnosis not present

## 2021-09-10 DIAGNOSIS — N2581 Secondary hyperparathyroidism of renal origin: Secondary | ICD-10-CM | POA: Diagnosis not present

## 2021-09-10 DIAGNOSIS — D509 Iron deficiency anemia, unspecified: Secondary | ICD-10-CM | POA: Diagnosis not present

## 2021-09-10 DIAGNOSIS — Z992 Dependence on renal dialysis: Secondary | ICD-10-CM | POA: Diagnosis not present

## 2021-09-10 DIAGNOSIS — N269 Renal sclerosis, unspecified: Secondary | ICD-10-CM | POA: Diagnosis not present

## 2021-09-11 ENCOUNTER — Ambulatory Visit: Payer: Medicare Other | Admitting: Physical Therapy

## 2021-09-12 DIAGNOSIS — N186 End stage renal disease: Secondary | ICD-10-CM | POA: Diagnosis not present

## 2021-09-12 DIAGNOSIS — D631 Anemia in chronic kidney disease: Secondary | ICD-10-CM | POA: Diagnosis not present

## 2021-09-12 DIAGNOSIS — Z992 Dependence on renal dialysis: Secondary | ICD-10-CM | POA: Diagnosis not present

## 2021-09-12 DIAGNOSIS — E785 Hyperlipidemia, unspecified: Secondary | ICD-10-CM | POA: Diagnosis not present

## 2021-09-12 DIAGNOSIS — N2581 Secondary hyperparathyroidism of renal origin: Secondary | ICD-10-CM | POA: Diagnosis not present

## 2021-09-12 DIAGNOSIS — D509 Iron deficiency anemia, unspecified: Secondary | ICD-10-CM | POA: Diagnosis not present

## 2021-09-15 DIAGNOSIS — D509 Iron deficiency anemia, unspecified: Secondary | ICD-10-CM | POA: Diagnosis not present

## 2021-09-15 DIAGNOSIS — N2581 Secondary hyperparathyroidism of renal origin: Secondary | ICD-10-CM | POA: Diagnosis not present

## 2021-09-15 DIAGNOSIS — D631 Anemia in chronic kidney disease: Secondary | ICD-10-CM | POA: Diagnosis not present

## 2021-09-15 DIAGNOSIS — N186 End stage renal disease: Secondary | ICD-10-CM | POA: Diagnosis not present

## 2021-09-15 DIAGNOSIS — Z992 Dependence on renal dialysis: Secondary | ICD-10-CM | POA: Diagnosis not present

## 2021-09-15 DIAGNOSIS — E785 Hyperlipidemia, unspecified: Secondary | ICD-10-CM | POA: Diagnosis not present

## 2021-09-16 ENCOUNTER — Encounter: Payer: Self-pay | Admitting: Physical Therapy

## 2021-09-16 ENCOUNTER — Ambulatory Visit: Payer: Medicare Other | Attending: Adult Health | Admitting: Physical Therapy

## 2021-09-16 DIAGNOSIS — R2681 Unsteadiness on feet: Secondary | ICD-10-CM | POA: Diagnosis not present

## 2021-09-16 DIAGNOSIS — R278 Other lack of coordination: Secondary | ICD-10-CM | POA: Diagnosis not present

## 2021-09-16 DIAGNOSIS — R293 Abnormal posture: Secondary | ICD-10-CM | POA: Insufficient documentation

## 2021-09-16 DIAGNOSIS — R2689 Other abnormalities of gait and mobility: Secondary | ICD-10-CM | POA: Insufficient documentation

## 2021-09-16 DIAGNOSIS — M6281 Muscle weakness (generalized): Secondary | ICD-10-CM | POA: Insufficient documentation

## 2021-09-16 NOTE — Therapy (Signed)
OUTPATIENT PHYSICAL THERAPY TREATMENT NOTE   Patient Name: Katie Nunez MRN: [id number] DOB:1955-08-09, 66 y.o., female Today's Date: 09/16/2021   END OF SESSION:   PT End of Session - 09/16/21 1155     Visit Number 4    Date for PT Re-Evaluation 11/25/21    Authorization Type [employer] Medicare    PT Start Time 1153   pt arrived late   PT Stop Time 1231    PT Time Calculation (min) 38 min    Activity Tolerance Patient tolerated treatment well;Patient limited by fatigue    Behavior During Therapy WFL for tasks assessed/performed             Past Medical History:  Diagnosis Date   Achalasia    Anxiety    Dysplasia of cervix, low grade (CIN 1)    Environmental allergies    "all year long" (12/27/2016)   ESRD (end stage renal disease) on dialysis ([place])    "TTS; [place]" (12/27/2016)   Fibromyalgia    GERD (gastroesophageal reflux disease)    Gout    Hypertension    IBS (irritable bowel syndrome)    MVP (mitral valve prolapse)    RA (rheumatoid arthritis) ([place])    FOLLOWED BY DR. SHANAHAN   Raynaud's disease    Scleroderma ([place])    Seasonal allergies    Thrombocytopenia ([place]) 07/01/2016   Acute fall to 13,000 07/01/16   Tubular adenoma 01/08/2008   CECUM   Vitamin D deficiency    Past Surgical History:  Procedure Laterality Date   ANKLE FRACTURE SURGERY Right    AV FISTULA PLACEMENT Left 06/28/2016   Procedure: left arm ARTERIOVENOUS (AV) FISTULA CREATION;  Surgeon: Rosetta Posner, MD;  Location: [facility];  Service: Vascular;  Laterality: Left;   [place] Left 09/27/2016   Procedure: LEFT UPPER ARM CEPHALIC VEIN TRANSPOSITION;  Surgeon: Rosetta Posner, MD;  Location: [facility];  Service: Vascular;  Laterality: Left;   BREAST BIOPSY     "? side"   [place]     COLONOSCOPY W/ BIOPSIES  01/08/2008   INSERTION OF DIALYSIS CATHETER Right 06/28/2016   Procedure: INSERTION OF DIALYSIS CATHETER, right internal jugular;   Surgeon: Rosetta Posner, MD;  Location: [facility];  Service: Vascular;  Laterality: Right;   MYOMECTOMY     NASAL ENDOSCOPY WITH EPISTAXIS CONTROL N/A 12/29/2019   Procedure: NASAL ENDOSCOPY WITH EPISTAXIS CONTROL;  Surgeon: Leta Baptist, MD;  Location: [facility];  Service: ENT;  Laterality: N/A;   PELVIC LAPAROSCOPY  2011   superficial thrombophlebitis Left 07-2014   Patient Active Problem List   Diagnosis Date Noted   Neck pain 10/30/2020   Knee locking, left 10/05/2020   Abnormality of gait 02/25/2020   Protein-calorie malnutrition, severe 01/19/2020   Epistaxis    Sleep disturbance    Slow transit constipation    Hemorrhoids    Chronic systolic congestive heart failure ([place])    ESRD on dialysis ([place])    Right hemiparesis ([place])    Pressure injury of skin 12/20/2019   Intraparenchymal hemorrhage of brain ([place]) 12/18/2019   ICH (intracerebral hemorrhage) ([place]) 12/12/2019   Viral disease 08/22/2019   Chronic right-sided heart failure ([place]) 07/09/2019   Supraventricular tachycardia ([place]) 07/09/2019   Other cirrhosis of liver ([place]) 06/27/2019   Heart failure ([place]) 02/21/2019   Heart palpitations 08/14/2018   Other fluid overload 07/27/2018   Encounter for long-term (current) use  of other medications 01/18/2018   Hypothyroidism 01/18/2018   Myalgia and myositis 01/18/2018   Chronic nephritis 01/18/2018   Other long term (current) drug therapy 01/18/2018   Sleep apnea 01/18/2018   Unspecified persistent mental disorders due to conditions classified elsewhere 01/18/2018   Venous reflux 01/18/2018   Vitamin B12 deficiency 01/18/2018   Vitamin D deficiency 01/18/2018   Obstructive lung disease ([place]) 12/30/2017   Other pruritus 12/27/2017   Eruption cyst 12/19/2017   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 ([place]) 11/28/2017   Chronic cough 11/11/2017   Pulmonary hypertension ([place]) 11/11/2017   Pulmonary arterial hypertension ([place]) 10/07/2017   Acute ITP ([place])  01/05/2017   ESRD (end stage renal disease) ([place]) 12/28/2016   Encounter for removal of sutures 09/09/2016   Coagulation defect, unspecified ([place]) 09/01/2016   Underimmunization status 08/05/2016   Hemolytic anemia ([place]) 07/26/2016   Epistaxis, recurrent 07/26/2016   Unspecified protein-calorie malnutrition ([place]) 07/13/2016   Aftercare including intermittent dialysis ([place]) 07/07/2016   Anemia in chronic kidney disease 07/07/2016   Hypokalemia 07/07/2016   Iron deficiency anemia, unspecified 07/07/2016   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   Nonrheumatic mitral (valve) prolapse 07/07/2016   Other irritable bowel syndrome 07/07/2016   Other secondary thrombocytopenia 07/07/2016   Secondary hyperparathyroidism of renal origin ([place]) 07/07/2016   Thrombocytopenia ([place]) 07/01/2016   ARF (acute renal failure) ([place]) 06/25/2016   Anxiety 06/25/2016   Bilateral carpal tunnel syndrome 10/22/2015   Chronic gout without tophus 10/22/2015   Chronic nonintractable headache 10/08/2015   Fibroid uterus 01/03/2012   H/O vitamin D deficiency 01/03/2012   Post-menopausal 01/03/2012   Hereditary and idiopathic peripheral neuropathy 11/19/2011   Intestinal malabsorption 52/71/2929   Lichen planus 09/14/147   Low back pain 11/19/2011   Diffuse spasm of esophagus 11/11/2011   ESR raised 11/11/2011   Postinflammatory pulmonary fibrosis ([place]) 11/11/2011   Scleroderma ([place]) 11/16/2010   Rheumatoid arthritis ([place]) 11/16/2010   Raynaud's disease 11/16/2010   Symptomatic menopausal or female climacteric states 11/16/2010    ONSET DATE: 12/12/19   REFERRING DIAG: G81.10, R26.89, G81.91    THERAPY DIAG:  Muscle weakness (generalized)   Other abnormalities of gait and mobility   Other lack of coordination   Unsteadiness on feet   Abnormal posture   Other symptoms and signs involving the nervous system   Rationale for Evaluation and Treatment Rehabilitation   SUBJECTIVE:  SUBJECTIVE STATEMENT: Pt states that things are going well. She is not sure if she could make 2x/week work out.   Pt accompanied by: self   PERTINENT HISTORY: Raynaud's disease, rheumatoid arthritis, ESRD on dialysis Tuesday/Thursday/Saturday, scleroderma,    PAIN:  Are you having pain? Yes: NPRS scale: 8/10 Pain location: R leg from knee into foot Pain description: R leg nerve pain, numbness, sharp, quick Aggravating factors: walking, stairs Relieving factors: meds sometimes but I try not to take them, not moving   PRECAUTIONS: Fall   WEIGHT BEARING RESTRICTIONS No   FALLS: Has patient fallen in last 6 months? No   LIVING ENVIRONMENT: Lives with: lives with their family Lives in: House/apartment Stairs: Yes: Internal: 15 steps; on right going up and on left going up and External: 3 steps; bilateral but cannot reach both Has following equipment at home: Single point cane and Walker - 4 wheeled   PLOF: Independent, [place] with basic ADLs, Independent with gait, and Independent with transfers   PATIENT GOALS to walk better, get stronger and not be so tired   OBJECTIVE:    DIAGNOSTIC FINDINGS:  IMPRESSION: 10/22/20 1. Mild intrasubstance degeneration of the menisci without well-defined tear. 2. Otherwise, unremarkable MRI of the left knee.   COGNITION: Overall cognitive status: Within functional limits for tasks assessed             SENSATION: WFL   MUSCLE TONE: RLE: Mild     MUSCLE LENGTH: Hamstrings: Bilateral tightness, more on R side     POSTURE: rounded shoulders, forward head, increased thoracic kyphosis, flexed trunk , and weight shift left   LOWER EXTREMITY ROM:      Active  Right Eval Left Eval  Hip flexion Limited due to weakness  [facility]  Hip extension      Hip abduction      Hip  adduction      Hip internal rotation      Hip external rotation      Knee flexion 110d WNL  Knee extension WNL WNL  Ankle dorsiflexion neutral 8d  Ankle plantarflexion WFL WNL  Ankle inversion      Ankle eversion       (Blank rows = not tested)   LOWER EXTREMITY MMT:     MMT Right Eval Left Eval  Hip flexion 2+ 3+  Hip extension      Hip abduction      Hip adduction      Hip internal rotation 3+ 4  Hip external rotation 3+ 4  Knee flexion 3+ 4  Knee extension 3+ 4  Ankle dorsiflexion 3- 4  Ankle plantarflexion 3 5  Ankle inversion 3 4  Ankle eversion 3- 4  (Blank rows = not tested)     TRANSFERS: Assistive device utilized: Single point cane  Sit to stand: Modified independence Stand to sit: Modified independence Chair to chair: Modified independence     GAIT: Gait pattern: decreased arm swing- Right, decreased step length- Right, decreased stance time- Right, decreased stride length, decreased hip/knee flexion- Right, decreased ankle dorsiflexion- Right, Left hip hike, shuffling, ataxic, trendelenburg, lateral lean- Left, decreased trunk rotation, wide BOS, and poor foot clearance- Right Distance walked: in clinic distances Assistive device utilized: Single point cane Level of assistance: Modified independence     FUNCTIONAL TESTs:  5 times sit to stand: 22.71s 3reps, unable to complete 5 reps  Timed up and go (TUG): 21.28s   PATIENT SURVEYS:  FOTO 45   TODAY'S TREATMENT:  09/16/21: Nustep: L5 x5 min: small rest break at , , and 4 min Sit to stand holding #5 x10 reps  Standing gastroc and hip flexor stretch x2 on Rt, x1 on Lt at stairs Heel raises BLE x10 reps Standing hip abduction #2, 2x5 reps Standing hamstring curls #2, 2x5 reps    09/09/21: Rt gastroc stretch with strap 3x30 sec hold  Supine groin butterfly stretch 5x10 sec hold  BLE bridge x10 reps Rt SLR 3x5 reps- quad set prior to lift  Rt LE step ups 4", B HHA x10 reps  Standing hip  abduction in // bars x5 reps each Standing heel/toe raises BUE support x10 reps  Standing hamstring curls #2 2x5 reps  Standing rows red TB x10 reps   09/02/21 Nustep L3 x49mins  STS x10 LAQ R 2# 2x10  HS curls R yellowTB HS stretch and SK2C 30s BLE Bridges 2x10  SLR R only 2x10      PATIENT EDUCATION: Education details: POC  Person educated: Patient Education method: Explanation Education comprehension: verbalized understanding     HOME EXERCISE PROGRAM: Access Code: [email or web address] URL: [email or web address] Date: 09/16/2021 Prepared by: [facility] Rehab - [facility]  Exercises - Long Sitting Calf Stretch with Strap  - 2 x daily - 7 x weekly - 3 sets - 30 seconds hold - Supine Butterfly Groin Stretch  - 2 x daily - 7 x weekly - 3 sets - 10 reps - Bridge  - 1 x daily - 7 x weekly - 2 sets - 10 reps - Small Range Straight Leg Raise (with brace locked in extension)  - 1 x daily - 7 x weekly - 2 sets - 10 reps - Standing Shoulder Row with Anchored Resistance  - 1 x daily - 7 x weekly - 2 sets - 10 reps       GOALS: Goals reviewed with patient? Yes   SHORT TERM GOALS: Target date: 10/07/21   Patient will be independent with initial HEP. Goal status: INITIAL   2.  Patient will be educated on strategies to decrease risk of falls.  Goal status: INITIAL     LONG TERM GOALS: Target date: 11/25/21   Patient will be independent with advanced/ongoing HEP to improve outcomes and carryover.  Goal status: INITIAL   2.  Patient will be able to ambulate 500' with LRAD and up and down curbs with good safety to access community.  Goal status: INITIAL    3.  Patient will demonstrate improved functional LE strength as demonstrated by 4/5 in all muscle groups. Goal status: INITIAL   4.  Patient will report 39 on FOTO to demonstrate improved functional ability. Baseline: 45 Goal status: INITIAL   5.  Patient will demonstrate decreased fall  risk by scoring < 15 sec on TUG. Baseline: 21.28s Goal status: INITIAL   6.   Patient will demonstrate decreased fall risk and increased LE functional strength by scoring < 35sec on 5xSTS. Baseline: unable to complete test, 22.71s 3reps  Goal status: INITIAL   ASSESSMENT:   CLINICAL IMPRESSION: Session continued with therex to improve LE strength and endurance. Pt required multiple rest breaks during Nustep activity but was able to reach 5 minutes. Pt has concerns with being able to come 2x/week for the remainder of her POC due to dialysis and her fatigue. We decided to try 1x/week for the remainder of her POC with emphasis on frequently updated HEP for her to work on at home.  OBJECTIVE IMPAIRMENTS Abnormal gait, decreased balance, decreased coordination, decreased mobility, difficulty walking, decreased ROM, decreased strength, increased fascial restrictions, impaired UE functional use, improper body mechanics, and pain.    ACTIVITY LIMITATIONS carrying, lifting, bending, standing, stairs, transfers, and locomotion level   PARTICIPATION LIMITATIONS: cleaning, laundry, shopping, and community activity   PERSONAL FACTORS Past/current experiences, Time since onset of injury/illness/exacerbation, and 3+ comorbidities: pulmonary HTN, hx of stroke, scleroderma, rheumatoid arthritis, ESRD  are also affecting patient's functional outcome.    REHAB POTENTIAL: Good   CLINICAL DECISION MAKING: Evolving/moderate complexity   EVALUATION COMPLEXITY: Moderate   PLAN: PT FREQUENCY: 1-2x/week   PT DURATION: 12 weeks   PLANNED INTERVENTIONS: Therapeutic exercises, Therapeutic activity, Neuromuscular re-education, Balance training, Gait training, Patient/Family education, Self Care, Joint mobilization, Stair training, Dry Needling, Electrical stimulation, Cryotherapy, Moist heat, Taping, Vasopneumatic device, Ionotophoresis 4mg /ml Dexamethasone, and Manual therapy   PLAN FOR NEXT SESSION: low  level strength training, stretching     2:22 PM,09/16/21 Sherol Dade PT, DPT [place] [place]

## 2021-09-17 DIAGNOSIS — Z992 Dependence on renal dialysis: Secondary | ICD-10-CM | POA: Diagnosis not present

## 2021-09-17 DIAGNOSIS — E785 Hyperlipidemia, unspecified: Secondary | ICD-10-CM | POA: Diagnosis not present

## 2021-09-17 DIAGNOSIS — D509 Iron deficiency anemia, unspecified: Secondary | ICD-10-CM | POA: Diagnosis not present

## 2021-09-17 DIAGNOSIS — D631 Anemia in chronic kidney disease: Secondary | ICD-10-CM | POA: Diagnosis not present

## 2021-09-17 DIAGNOSIS — N186 End stage renal disease: Secondary | ICD-10-CM | POA: Diagnosis not present

## 2021-09-17 DIAGNOSIS — N2581 Secondary hyperparathyroidism of renal origin: Secondary | ICD-10-CM | POA: Diagnosis not present

## 2021-09-18 ENCOUNTER — Ambulatory Visit: Payer: Medicare Other | Admitting: Physical Therapy

## 2021-09-19 DIAGNOSIS — Z992 Dependence on renal dialysis: Secondary | ICD-10-CM | POA: Diagnosis not present

## 2021-09-19 DIAGNOSIS — E785 Hyperlipidemia, unspecified: Secondary | ICD-10-CM | POA: Diagnosis not present

## 2021-09-19 DIAGNOSIS — D509 Iron deficiency anemia, unspecified: Secondary | ICD-10-CM | POA: Diagnosis not present

## 2021-09-19 DIAGNOSIS — N2581 Secondary hyperparathyroidism of renal origin: Secondary | ICD-10-CM | POA: Diagnosis not present

## 2021-09-19 DIAGNOSIS — R0609 Other forms of dyspnea: Secondary | ICD-10-CM | POA: Diagnosis not present

## 2021-09-19 DIAGNOSIS — I270 Primary pulmonary hypertension: Secondary | ICD-10-CM | POA: Diagnosis not present

## 2021-09-19 DIAGNOSIS — N186 End stage renal disease: Secondary | ICD-10-CM | POA: Diagnosis not present

## 2021-09-19 DIAGNOSIS — D631 Anemia in chronic kidney disease: Secondary | ICD-10-CM | POA: Diagnosis not present

## 2021-09-22 DIAGNOSIS — D509 Iron deficiency anemia, unspecified: Secondary | ICD-10-CM | POA: Diagnosis not present

## 2021-09-22 DIAGNOSIS — N186 End stage renal disease: Secondary | ICD-10-CM | POA: Diagnosis not present

## 2021-09-22 DIAGNOSIS — N2581 Secondary hyperparathyroidism of renal origin: Secondary | ICD-10-CM | POA: Diagnosis not present

## 2021-09-22 DIAGNOSIS — D631 Anemia in chronic kidney disease: Secondary | ICD-10-CM | POA: Diagnosis not present

## 2021-09-22 DIAGNOSIS — Z992 Dependence on renal dialysis: Secondary | ICD-10-CM | POA: Diagnosis not present

## 2021-09-22 DIAGNOSIS — E785 Hyperlipidemia, unspecified: Secondary | ICD-10-CM | POA: Diagnosis not present

## 2021-09-24 DIAGNOSIS — D509 Iron deficiency anemia, unspecified: Secondary | ICD-10-CM | POA: Diagnosis not present

## 2021-09-24 DIAGNOSIS — D631 Anemia in chronic kidney disease: Secondary | ICD-10-CM | POA: Diagnosis not present

## 2021-09-24 DIAGNOSIS — N186 End stage renal disease: Secondary | ICD-10-CM | POA: Diagnosis not present

## 2021-09-24 DIAGNOSIS — E785 Hyperlipidemia, unspecified: Secondary | ICD-10-CM | POA: Diagnosis not present

## 2021-09-24 DIAGNOSIS — N2581 Secondary hyperparathyroidism of renal origin: Secondary | ICD-10-CM | POA: Diagnosis not present

## 2021-09-24 DIAGNOSIS — Z992 Dependence on renal dialysis: Secondary | ICD-10-CM | POA: Diagnosis not present

## 2021-09-25 ENCOUNTER — Ambulatory Visit
Admission: RE | Admit: 2021-09-25 | Discharge: 2021-09-25 | Disposition: A | Discharge status: Home / Self Care | Payer: Medicare Other | Source: Ambulatory Visit | Attending: Emergency Medicine | Admitting: Emergency Medicine

## 2021-09-25 ENCOUNTER — Ambulatory Visit: Payer: Medicare Other | Admitting: Physical Therapy

## 2021-09-25 ENCOUNTER — Encounter: Payer: Self-pay | Admitting: Physical Therapy

## 2021-09-25 ENCOUNTER — Ambulatory Visit: Payer: Medicare Other

## 2021-09-25 DIAGNOSIS — J849 Interstitial pulmonary disease, unspecified: Secondary | ICD-10-CM

## 2021-09-25 DIAGNOSIS — R278 Other lack of coordination: Secondary | ICD-10-CM | POA: Diagnosis not present

## 2021-09-25 DIAGNOSIS — R2681 Unsteadiness on feet: Secondary | ICD-10-CM | POA: Diagnosis not present

## 2021-09-25 DIAGNOSIS — R2689 Other abnormalities of gait and mobility: Secondary | ICD-10-CM

## 2021-09-25 DIAGNOSIS — R293 Abnormal posture: Secondary | ICD-10-CM

## 2021-09-25 DIAGNOSIS — R918 Other nonspecific abnormal finding of lung field: Secondary | ICD-10-CM | POA: Diagnosis not present

## 2021-09-25 DIAGNOSIS — I2510 Atherosclerotic heart disease of native coronary artery without angina pectoris: Secondary | ICD-10-CM | POA: Diagnosis not present

## 2021-09-25 DIAGNOSIS — J929 Pleural plaque without asbestos: Secondary | ICD-10-CM | POA: Diagnosis not present

## 2021-09-25 DIAGNOSIS — M6281 Muscle weakness (generalized): Secondary | ICD-10-CM

## 2021-09-25 NOTE — Therapy (Signed)
OUTPATIENT PHYSICAL THERAPY TREATMENT NOTE   Patient Name: Katie Nunez MRN: [id number] DOB:October 03, 1955, 66 y.o., female Today's Date: 09/25/2021   END OF SESSION:   PT End of Session - 09/25/21 1007     Visit Number 5    Date for PT Re-Evaluation 11/25/21    Authorization Type [employer] Medicare    PT Start Time [phone]   arrived late   PT Stop Time 1012    PT Time Calculation (min) 28 min    Activity Tolerance Patient tolerated treatment well;Patient limited by fatigue    Behavior During Therapy WFL for tasks assessed/performed              Past Medical History:  Diagnosis Date   Achalasia    Anxiety    Dysplasia of cervix, low grade (CIN 1)    Environmental allergies    "all year long" (12/27/2016)   ESRD (end stage renal disease) on dialysis ([place])    "TTS; Foley" (12/27/2016)   Fibromyalgia    GERD (gastroesophageal reflux disease)    Gout    Hypertension    IBS (irritable bowel syndrome)    MVP (mitral valve prolapse)    RA (rheumatoid arthritis) ([place])    FOLLOWED BY DR. SHANAHAN   Raynaud's disease    Scleroderma ([place])    Seasonal allergies    Thrombocytopenia ([place]) 07/01/2016   Acute fall to 13,000 07/01/16   Tubular adenoma 01/08/2008   CECUM   Vitamin D deficiency    Past Surgical History:  Procedure Laterality Date   ANKLE FRACTURE SURGERY Right    AV FISTULA PLACEMENT Left 06/28/2016   Procedure: left arm ARTERIOVENOUS (AV) FISTULA CREATION;  Surgeon: Rosetta Posner, MD;  Location: [facility];  Service: Vascular;  Laterality: Left;   [place] Left 09/27/2016   Procedure: LEFT UPPER ARM CEPHALIC VEIN TRANSPOSITION;  Surgeon: Rosetta Posner, MD;  Location: [place];  Service: Vascular;  Laterality: Left;   BREAST BIOPSY     "? side"   [place]     COLONOSCOPY W/ BIOPSIES  01/08/2008   INSERTION OF DIALYSIS CATHETER Right 06/28/2016   Procedure: INSERTION OF DIALYSIS CATHETER, right internal jugular;   Surgeon: Rosetta Posner, MD;  Location: [facility];  Service: Vascular;  Laterality: Right;   MYOMECTOMY     NASAL ENDOSCOPY WITH EPISTAXIS CONTROL N/A 12/29/2019   Procedure: NASAL ENDOSCOPY WITH EPISTAXIS CONTROL;  Surgeon: Leta Baptist, MD;  Location: [place];  Service: ENT;  Laterality: N/A;   PELVIC LAPAROSCOPY  2011   superficial thrombophlebitis Left 07-2014   Patient Active Problem List   Diagnosis Date Noted   Neck pain 10/30/2020   Knee locking, left 10/05/2020   Abnormality of gait 02/25/2020   Protein-calorie malnutrition, severe 01/19/2020   Epistaxis    Sleep disturbance    Slow transit constipation    Hemorrhoids    Chronic systolic congestive heart failure ([place])    ESRD on dialysis ([place])    Right hemiparesis ([place])    Pressure injury of skin 12/20/2019   Intraparenchymal hemorrhage of brain ([place]) 12/18/2019   ICH (intracerebral hemorrhage) ([place]) 12/12/2019   Viral disease 08/22/2019   Chronic right-sided heart failure ([place]) 07/09/2019   Supraventricular tachycardia ([place]) 07/09/2019   Other cirrhosis of liver ([place]) 06/27/2019   Heart failure ([place]) 02/21/2019   Heart palpitations 08/14/2018   Other fluid overload 07/27/2018   Encounter for long-term (current) use  of other medications 01/18/2018   Hypothyroidism 01/18/2018   Myalgia and myositis 01/18/2018   Chronic nephritis 01/18/2018   Other long term (current) drug therapy 01/18/2018   Sleep apnea 01/18/2018   Unspecified persistent mental disorders due to conditions classified elsewhere 01/18/2018   Venous reflux 01/18/2018   Vitamin B12 deficiency 01/18/2018   Vitamin D deficiency 01/18/2018   Obstructive lung disease ([place]) 12/30/2017   Other pruritus 12/27/2017   Eruption cyst 12/19/2017   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 ([place]) 11/28/2017   Chronic cough 11/11/2017   Pulmonary hypertension ([place]) 11/11/2017   Pulmonary arterial hypertension ([place]) 10/07/2017   Acute ITP ([place])  01/05/2017   ESRD (end stage renal disease) ([place]) 12/28/2016   Encounter for removal of sutures 09/09/2016   Coagulation defect, unspecified ([place]) 09/01/2016   Underimmunization status 08/05/2016   Hemolytic anemia ([place]) 07/26/2016   Epistaxis, recurrent 07/26/2016   Unspecified protein-calorie malnutrition ([place]) 07/13/2016   Aftercare including intermittent dialysis ([place]) 07/07/2016   Anemia in chronic kidney disease 07/07/2016   Hypokalemia 07/07/2016   Iron deficiency anemia, unspecified 07/07/2016   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   Nonrheumatic mitral (valve) prolapse 07/07/2016   Other irritable bowel syndrome 07/07/2016   Other secondary thrombocytopenia 07/07/2016   Secondary hyperparathyroidism of renal origin ([place]) 07/07/2016   Thrombocytopenia ([place]) 07/01/2016   ARF (acute renal failure) ([place]) 06/25/2016   Anxiety 06/25/2016   Bilateral carpal tunnel syndrome 10/22/2015   Chronic gout without tophus 10/22/2015   Chronic nonintractable headache 10/08/2015   Fibroid uterus 01/03/2012   H/O vitamin D deficiency 01/03/2012   Post-menopausal 01/03/2012   Hereditary and idiopathic peripheral neuropathy 11/19/2011   Intestinal malabsorption 52/71/2929   Lichen planus 09/14/147   Low back pain 11/19/2011   Diffuse spasm of esophagus 11/11/2011   ESR raised 11/11/2011   Postinflammatory pulmonary fibrosis ([place]) 11/11/2011   Scleroderma ([place]) 11/16/2010   Rheumatoid arthritis ([place]) 11/16/2010   Raynaud's disease 11/16/2010   Symptomatic menopausal or female climacteric states 11/16/2010    ONSET DATE: 12/12/19   REFERRING DIAG: G81.10, R26.89, G81.91    THERAPY DIAG:  Muscle weakness (generalized)   Other abnormalities of gait and mobility   Other lack of coordination   Unsteadiness on feet   Abnormal posture   Other symptoms and signs involving the nervous system   Rationale for Evaluation and Treatment Rehabilitation   SUBJECTIVE:  SUBJECTIVE STATEMENT:  Not feeling so hot this morning, I'm just feeling tired. No falls  or anything. Get winded fast bc of my hypotension, didn't take my BP this morning.    Pt accompanied by: self   PERTINENT HISTORY: Raynaud's disease, rheumatoid arthritis, ESRD on dialysis Tuesday/Thursday/Saturday, scleroderma,    PAIN:  Are you having pain? Yes: NPRS scale: 0/10 Pain location: just stiffness/tightness B LEs L>R Pain description:  Aggravating factors: unclear  Relieving factors: unclear   PRECAUTIONS: Fall   WEIGHT BEARING RESTRICTIONS No   FALLS: Has patient fallen in last 6 months? No   LIVING ENVIRONMENT: Lives with: lives with their family Lives in: House/apartment Stairs: Yes: Internal: 15 steps; on right going up and on left going up and External: 3 steps; bilateral but cannot reach both Has following equipment at home: Single point cane and Walker - 4 wheeled   PLOF: Independent, [place] with basic ADLs, Independent with gait, and Independent with transfers   PATIENT GOALS to walk better, get stronger and not be so tired   OBJECTIVE:    DIAGNOSTIC FINDINGS:  IMPRESSION: 10/22/20 1. Mild intrasubstance degeneration of the menisci without well-defined tear. 2. Otherwise, unremarkable MRI of the left knee.   COGNITION: Overall cognitive status: Within functional limits for tasks assessed             SENSATION: WFL   MUSCLE TONE: RLE: Mild     MUSCLE LENGTH: Hamstrings: Bilateral tightness, more on R side     POSTURE: rounded shoulders, forward head, increased thoracic kyphosis, flexed trunk , and weight shift left   LOWER EXTREMITY ROM:      Active  Right Eval Left Eval  Hip flexion Limited due to weakness  [facility]  Hip extension      Hip abduction      Hip adduction      Hip  internal rotation      Hip external rotation      Knee flexion 110d WNL  Knee extension WNL WNL  Ankle dorsiflexion neutral 8d  Ankle plantarflexion WFL WNL  Ankle inversion      Ankle eversion       (Blank rows = not tested)   LOWER EXTREMITY MMT:     MMT Right Eval Left Eval  Hip flexion 2+ 3+  Hip extension      Hip abduction      Hip adduction      Hip internal rotation 3+ 4  Hip external rotation 3+ 4  Knee flexion 3+ 4  Knee extension 3+ 4  Ankle dorsiflexion 3- 4  Ankle plantarflexion 3 5  Ankle inversion 3 4  Ankle eversion 3- 4  (Blank rows = not tested)     TRANSFERS: Assistive device utilized: Single point cane  Sit to stand: Modified independence Stand to sit: Modified independence Chair to chair: Modified independence     GAIT: Gait pattern: decreased arm swing- Right, decreased step length- Right, decreased stance time- Right, decreased stride length, decreased hip/knee flexion- Right, decreased ankle dorsiflexion- Right, Left hip hike, shuffling, ataxic, trendelenburg, lateral lean- Left, decreased trunk rotation, wide BOS, and poor foot clearance- Right Distance walked: in clinic distances Assistive device utilized: Single point cane Level of assistance: Modified independence     FUNCTIONAL TESTs:  5 times sit to stand: 22.71s 3reps, unable to complete 5 reps  Timed up and go (TUG): 21.28s   PATIENT SURVEYS:  FOTO 45   TODAY'S TREATMENT:   9/15  Nustep L3  BLEs x6 minutes total had to stop after 2 minutes, 3 minutes  Quad stretches in prone 3x30 seconds B   STS 5# x12  Standing hip ABD 3# x5 B Standing marches 3# B x10   09/16/21: Nustep: L5 x5 min: small rest break at 12mn, 367m, and 4 min Sit to stand holding #5 x10 reps  Standing gastroc and hip flexor stretch x2 on Rt, x1 on Lt at stairs Heel raises BLE x10 reps Standing hip abduction #2, 2x5 reps Standing hamstring curls #2, 2x5 reps    09/09/21: Rt gastroc stretch with strap  3x30 sec hold  Supine groin butterfly stretch 5x10 sec hold  BLE bridge x10 reps Rt SLR 3x5 reps- quad set prior to lift  Rt LE step ups 4", B HHA x10 reps  Standing hip abduction in // bars x5 reps each Standing heel/toe raises BUE support x10 reps  Standing hamstring curls #2 2x5 reps  Standing rows red TB x10 reps   09/02/21 Nustep L3 x5m81m  STS x10 LAQ R 2# 2x10  HS curls R yellowTB HS stretch and SK2C 30s BLE Bridges 2x10  SLR R only 2x10      PATIENT EDUCATION: Education details: POC  Person educated: Patient Education method: Explanation Education comprehension: verbalized understanding     HOME EXERCISE PROGRAM: Access Code: [email or web address] URL: [email or web address] Date: 09/16/2021 [facility] - Long Sitting Calf Stretch with Strap  - 2 x daily - 7 x weekly - 3 sets - 30 seconds hold - Supine Butterfly Groin Stretch  - 2 x daily - 7 x weekly - 3 sets - 10 reps - Bridge  - 1 x daily - 7 x weekly - 2 sets - 10 reps - Small Range Straight Leg Raise (with brace locked in extension)  - 1 x daily - 7 x weekly - 2 sets - 10 reps - Standing Shoulder Row with Anchored Resistance  - 1 x daily - 7 x weekly - 2 sets - 10 reps       GOALS: Goals reviewed with patient? Yes   SHORT TERM GOALS: Target date: 10/07/21   Patient will be independent with initial HEP. Goal status: INITIAL   2.  Patient will be educated on strategies to decrease risk of falls.  Goal status: INITIAL     LONG TERM GOALS: Target date: 11/25/21   Patient will be independent with advanced/ongoing HEP to improve outcomes and carryover.  Goal status: INITIAL   2.  Patient will be able to ambulate 500' with LRAD and up and down curbs with good safety to access community.  Goal status: INITIAL    3.  Patient will demonstrate improved functional LE strength as demonstrated by 4/5 in all muscle groups. Goal status:  INITIAL   4.  Patient will report 57 44 FOTO to demonstrate improved functional ability. Baseline: 45 Goal status: INITIAL   5.  Patient will demonstrate decreased fall risk by scoring < 15 sec on TUG. Baseline: 21.28s Goal status: INITIAL   6.   Patient will demonstrate decreased fall risk and increased LE functional strength by scoring < 35sec on 5xSTS. Baseline: unable to complete test, 22.71s 3reps  Goal status: INITIAL   ASSESSMENT:   CLINICAL IMPRESSION:  AnnLatesiarived almost 12 minutes late today. Was a bit unclear describing her symptoms with me today. We spent some time on the Nustep, did need multiple rest breaks on this  machine due to poor activity tolerance and strength overall. Otherwise worked on standing activities for strength as able and tolerated. Will continue to progress as able and tolerated.      OBJECTIVE IMPAIRMENTS Abnormal gait, decreased balance, decreased coordination, decreased mobility, difficulty walking, decreased ROM, decreased strength, increased fascial restrictions, impaired UE functional use, improper body mechanics, and pain.    ACTIVITY LIMITATIONS carrying, lifting, bending, standing, stairs, transfers, and locomotion level   PARTICIPATION LIMITATIONS: cleaning, laundry, shopping, and community activity   PERSONAL FACTORS Past/current experiences, Time since onset of injury/illness/exacerbation, and 3+ comorbidities: pulmonary HTN, hx of stroke, scleroderma, rheumatoid arthritis, ESRD  are also affecting patient's functional outcome.    REHAB POTENTIAL: Good   CLINICAL DECISION MAKING: Evolving/moderate complexity   EVALUATION COMPLEXITY: Moderate   PLAN: PT FREQUENCY: 1-2x/week   PT DURATION: 12 weeks   PLANNED INTERVENTIONS: Therapeutic exercises, Therapeutic activity, Neuromuscular re-education, Balance training, Gait training, Patient/Family education, Self Care, Joint mobilization, Stair training, Dry Needling, Electrical  stimulation, Cryotherapy, Moist heat, Taping, Vasopneumatic device, Ionotophoresis 35m/ml Dexamethasone, and Manual therapy   PLAN FOR NEXT SESSION: low level strength training, stretching      Vernis Eid U PT DPT PN2  09/25/2021, 10:14 AM

## 2021-09-26 DIAGNOSIS — N186 End stage renal disease: Secondary | ICD-10-CM | POA: Diagnosis not present

## 2021-09-26 DIAGNOSIS — E785 Hyperlipidemia, unspecified: Secondary | ICD-10-CM | POA: Diagnosis not present

## 2021-09-26 DIAGNOSIS — N2581 Secondary hyperparathyroidism of renal origin: Secondary | ICD-10-CM | POA: Diagnosis not present

## 2021-09-26 DIAGNOSIS — Z992 Dependence on renal dialysis: Secondary | ICD-10-CM | POA: Diagnosis not present

## 2021-09-26 DIAGNOSIS — D631 Anemia in chronic kidney disease: Secondary | ICD-10-CM | POA: Diagnosis not present

## 2021-09-26 DIAGNOSIS — D509 Iron deficiency anemia, unspecified: Secondary | ICD-10-CM | POA: Diagnosis not present

## 2021-09-29 DIAGNOSIS — D509 Iron deficiency anemia, unspecified: Secondary | ICD-10-CM | POA: Diagnosis not present

## 2021-09-29 DIAGNOSIS — E785 Hyperlipidemia, unspecified: Secondary | ICD-10-CM | POA: Diagnosis not present

## 2021-09-29 DIAGNOSIS — N186 End stage renal disease: Secondary | ICD-10-CM | POA: Diagnosis not present

## 2021-09-29 DIAGNOSIS — Z992 Dependence on renal dialysis: Secondary | ICD-10-CM | POA: Diagnosis not present

## 2021-09-29 DIAGNOSIS — D631 Anemia in chronic kidney disease: Secondary | ICD-10-CM | POA: Diagnosis not present

## 2021-09-29 DIAGNOSIS — N2581 Secondary hyperparathyroidism of renal origin: Secondary | ICD-10-CM | POA: Diagnosis not present

## 2021-09-30 ENCOUNTER — Ambulatory Visit: Payer: Medicare Other | Admitting: Emergency Medicine

## 2021-09-30 ENCOUNTER — Telehealth: Payer: Self-pay | Admitting: Emergency Medicine

## 2021-09-30 ENCOUNTER — Ambulatory Visit: Payer: Medicare Other | Admitting: Physical Therapy

## 2021-09-30 NOTE — Telephone Encounter (Signed)
Patient called to ask the nurse to call with her CT results.  She stated that her appt. Is in November and she would like to know her results before that time.  CB# [phone]

## 2021-10-01 DIAGNOSIS — N2581 Secondary hyperparathyroidism of renal origin: Secondary | ICD-10-CM | POA: Diagnosis not present

## 2021-10-01 DIAGNOSIS — N186 End stage renal disease: Secondary | ICD-10-CM | POA: Diagnosis not present

## 2021-10-01 DIAGNOSIS — E785 Hyperlipidemia, unspecified: Secondary | ICD-10-CM | POA: Diagnosis not present

## 2021-10-01 DIAGNOSIS — D631 Anemia in chronic kidney disease: Secondary | ICD-10-CM | POA: Diagnosis not present

## 2021-10-01 DIAGNOSIS — D509 Iron deficiency anemia, unspecified: Secondary | ICD-10-CM | POA: Diagnosis not present

## 2021-10-01 DIAGNOSIS — Z992 Dependence on renal dialysis: Secondary | ICD-10-CM | POA: Diagnosis not present

## 2021-10-02 ENCOUNTER — Ambulatory Visit: Payer: Medicare Other | Admitting: Nurse Practitioner

## 2021-10-03 DIAGNOSIS — Z992 Dependence on renal dialysis: Secondary | ICD-10-CM | POA: Diagnosis not present

## 2021-10-03 DIAGNOSIS — N186 End stage renal disease: Secondary | ICD-10-CM | POA: Diagnosis not present

## 2021-10-03 DIAGNOSIS — N2581 Secondary hyperparathyroidism of renal origin: Secondary | ICD-10-CM | POA: Diagnosis not present

## 2021-10-03 DIAGNOSIS — E785 Hyperlipidemia, unspecified: Secondary | ICD-10-CM | POA: Diagnosis not present

## 2021-10-03 DIAGNOSIS — D631 Anemia in chronic kidney disease: Secondary | ICD-10-CM | POA: Diagnosis not present

## 2021-10-03 DIAGNOSIS — D509 Iron deficiency anemia, unspecified: Secondary | ICD-10-CM | POA: Diagnosis not present

## 2021-10-06 DIAGNOSIS — D509 Iron deficiency anemia, unspecified: Secondary | ICD-10-CM | POA: Diagnosis not present

## 2021-10-06 DIAGNOSIS — Z992 Dependence on renal dialysis: Secondary | ICD-10-CM | POA: Diagnosis not present

## 2021-10-06 DIAGNOSIS — D631 Anemia in chronic kidney disease: Secondary | ICD-10-CM | POA: Diagnosis not present

## 2021-10-06 DIAGNOSIS — J310 Chronic rhinitis: Secondary | ICD-10-CM | POA: Diagnosis not present

## 2021-10-06 DIAGNOSIS — N186 End stage renal disease: Secondary | ICD-10-CM | POA: Diagnosis not present

## 2021-10-06 DIAGNOSIS — N2581 Secondary hyperparathyroidism of renal origin: Secondary | ICD-10-CM | POA: Diagnosis not present

## 2021-10-06 DIAGNOSIS — E785 Hyperlipidemia, unspecified: Secondary | ICD-10-CM | POA: Diagnosis not present

## 2021-10-07 ENCOUNTER — Ambulatory Visit: Payer: Medicare Other | Admitting: Physical Therapy

## 2021-10-07 ENCOUNTER — Telehealth: Payer: Self-pay | Admitting: Physical Therapy

## 2021-10-08 DIAGNOSIS — N186 End stage renal disease: Secondary | ICD-10-CM | POA: Diagnosis not present

## 2021-10-08 DIAGNOSIS — D631 Anemia in chronic kidney disease: Secondary | ICD-10-CM | POA: Diagnosis not present

## 2021-10-08 DIAGNOSIS — Z992 Dependence on renal dialysis: Secondary | ICD-10-CM | POA: Diagnosis not present

## 2021-10-08 DIAGNOSIS — E785 Hyperlipidemia, unspecified: Secondary | ICD-10-CM | POA: Diagnosis not present

## 2021-10-08 DIAGNOSIS — N2581 Secondary hyperparathyroidism of renal origin: Secondary | ICD-10-CM | POA: Diagnosis not present

## 2021-10-08 DIAGNOSIS — D509 Iron deficiency anemia, unspecified: Secondary | ICD-10-CM | POA: Diagnosis not present

## 2021-10-10 DIAGNOSIS — N2581 Secondary hyperparathyroidism of renal origin: Secondary | ICD-10-CM | POA: Diagnosis not present

## 2021-10-10 DIAGNOSIS — Z992 Dependence on renal dialysis: Secondary | ICD-10-CM | POA: Diagnosis not present

## 2021-10-10 DIAGNOSIS — D509 Iron deficiency anemia, unspecified: Secondary | ICD-10-CM | POA: Diagnosis not present

## 2021-10-10 DIAGNOSIS — N186 End stage renal disease: Secondary | ICD-10-CM | POA: Diagnosis not present

## 2021-10-10 DIAGNOSIS — E785 Hyperlipidemia, unspecified: Secondary | ICD-10-CM | POA: Diagnosis not present

## 2021-10-10 DIAGNOSIS — D631 Anemia in chronic kidney disease: Secondary | ICD-10-CM | POA: Diagnosis not present

## 2021-10-10 DIAGNOSIS — N269 Renal sclerosis, unspecified: Secondary | ICD-10-CM | POA: Diagnosis not present

## 2021-10-13 DIAGNOSIS — N2581 Secondary hyperparathyroidism of renal origin: Secondary | ICD-10-CM | POA: Diagnosis not present

## 2021-10-13 DIAGNOSIS — Z992 Dependence on renal dialysis: Secondary | ICD-10-CM | POA: Diagnosis not present

## 2021-10-13 DIAGNOSIS — D509 Iron deficiency anemia, unspecified: Secondary | ICD-10-CM | POA: Diagnosis not present

## 2021-10-13 DIAGNOSIS — N186 End stage renal disease: Secondary | ICD-10-CM | POA: Diagnosis not present

## 2021-10-13 DIAGNOSIS — D631 Anemia in chronic kidney disease: Secondary | ICD-10-CM | POA: Diagnosis not present

## 2021-10-14 ENCOUNTER — Ambulatory Visit: Payer: Medicare Other | Admitting: Physical Therapy

## 2021-10-15 DIAGNOSIS — D509 Iron deficiency anemia, unspecified: Secondary | ICD-10-CM | POA: Diagnosis not present

## 2021-10-15 DIAGNOSIS — N2581 Secondary hyperparathyroidism of renal origin: Secondary | ICD-10-CM | POA: Diagnosis not present

## 2021-10-15 DIAGNOSIS — N186 End stage renal disease: Secondary | ICD-10-CM | POA: Diagnosis not present

## 2021-10-15 DIAGNOSIS — Z992 Dependence on renal dialysis: Secondary | ICD-10-CM | POA: Diagnosis not present

## 2021-10-15 DIAGNOSIS — D631 Anemia in chronic kidney disease: Secondary | ICD-10-CM | POA: Diagnosis not present

## 2021-10-17 DIAGNOSIS — Z992 Dependence on renal dialysis: Secondary | ICD-10-CM | POA: Diagnosis not present

## 2021-10-17 DIAGNOSIS — N2581 Secondary hyperparathyroidism of renal origin: Secondary | ICD-10-CM | POA: Diagnosis not present

## 2021-10-17 DIAGNOSIS — D631 Anemia in chronic kidney disease: Secondary | ICD-10-CM | POA: Diagnosis not present

## 2021-10-17 DIAGNOSIS — D509 Iron deficiency anemia, unspecified: Secondary | ICD-10-CM | POA: Diagnosis not present

## 2021-10-17 DIAGNOSIS — N186 End stage renal disease: Secondary | ICD-10-CM | POA: Diagnosis not present

## 2021-10-19 DIAGNOSIS — R0609 Other forms of dyspnea: Secondary | ICD-10-CM | POA: Diagnosis not present

## 2021-10-19 DIAGNOSIS — I270 Primary pulmonary hypertension: Secondary | ICD-10-CM | POA: Diagnosis not present

## 2021-10-19 DIAGNOSIS — N186 End stage renal disease: Secondary | ICD-10-CM | POA: Diagnosis not present

## 2021-10-20 DIAGNOSIS — D61818 Other pancytopenia: Secondary | ICD-10-CM | POA: Diagnosis not present

## 2021-10-20 DIAGNOSIS — Z79899 Other long term (current) drug therapy: Secondary | ICD-10-CM | POA: Diagnosis not present

## 2021-10-20 DIAGNOSIS — D509 Iron deficiency anemia, unspecified: Secondary | ICD-10-CM | POA: Diagnosis not present

## 2021-10-20 DIAGNOSIS — D631 Anemia in chronic kidney disease: Secondary | ICD-10-CM | POA: Diagnosis not present

## 2021-10-20 DIAGNOSIS — R0609 Other forms of dyspnea: Secondary | ICD-10-CM | POA: Diagnosis not present

## 2021-10-20 DIAGNOSIS — Z992 Dependence on renal dialysis: Secondary | ICD-10-CM | POA: Diagnosis not present

## 2021-10-20 DIAGNOSIS — N186 End stage renal disease: Secondary | ICD-10-CM | POA: Diagnosis not present

## 2021-10-20 DIAGNOSIS — I2721 Secondary pulmonary arterial hypertension: Secondary | ICD-10-CM | POA: Diagnosis not present

## 2021-10-20 DIAGNOSIS — N2581 Secondary hyperparathyroidism of renal origin: Secondary | ICD-10-CM | POA: Diagnosis not present

## 2021-10-20 DIAGNOSIS — M359 Systemic involvement of connective tissue, unspecified: Secondary | ICD-10-CM | POA: Diagnosis not present

## 2021-10-22 DIAGNOSIS — D509 Iron deficiency anemia, unspecified: Secondary | ICD-10-CM | POA: Diagnosis not present

## 2021-10-22 DIAGNOSIS — N186 End stage renal disease: Secondary | ICD-10-CM | POA: Diagnosis not present

## 2021-10-22 DIAGNOSIS — Z992 Dependence on renal dialysis: Secondary | ICD-10-CM | POA: Diagnosis not present

## 2021-10-22 DIAGNOSIS — D631 Anemia in chronic kidney disease: Secondary | ICD-10-CM | POA: Diagnosis not present

## 2021-10-22 DIAGNOSIS — N2581 Secondary hyperparathyroidism of renal origin: Secondary | ICD-10-CM | POA: Diagnosis not present

## 2021-10-24 DIAGNOSIS — Z992 Dependence on renal dialysis: Secondary | ICD-10-CM | POA: Diagnosis not present

## 2021-10-24 DIAGNOSIS — N2581 Secondary hyperparathyroidism of renal origin: Secondary | ICD-10-CM | POA: Diagnosis not present

## 2021-10-24 DIAGNOSIS — D631 Anemia in chronic kidney disease: Secondary | ICD-10-CM | POA: Diagnosis not present

## 2021-10-24 DIAGNOSIS — N186 End stage renal disease: Secondary | ICD-10-CM | POA: Diagnosis not present

## 2021-10-24 DIAGNOSIS — D509 Iron deficiency anemia, unspecified: Secondary | ICD-10-CM | POA: Diagnosis not present

## 2021-10-26 DIAGNOSIS — L219 Seborrheic dermatitis, unspecified: Secondary | ICD-10-CM | POA: Diagnosis not present

## 2021-10-26 DIAGNOSIS — M793 Panniculitis, unspecified: Secondary | ICD-10-CM | POA: Insufficient documentation

## 2021-10-26 DIAGNOSIS — D492 Neoplasm of unspecified behavior of bone, soft tissue, and skin: Secondary | ICD-10-CM | POA: Diagnosis not present

## 2021-10-27 DIAGNOSIS — D509 Iron deficiency anemia, unspecified: Secondary | ICD-10-CM | POA: Diagnosis not present

## 2021-10-27 DIAGNOSIS — N2581 Secondary hyperparathyroidism of renal origin: Secondary | ICD-10-CM | POA: Diagnosis not present

## 2021-10-27 DIAGNOSIS — Z992 Dependence on renal dialysis: Secondary | ICD-10-CM | POA: Diagnosis not present

## 2021-10-27 DIAGNOSIS — D631 Anemia in chronic kidney disease: Secondary | ICD-10-CM | POA: Diagnosis not present

## 2021-10-27 DIAGNOSIS — N186 End stage renal disease: Secondary | ICD-10-CM | POA: Diagnosis not present

## 2021-10-29 DIAGNOSIS — N186 End stage renal disease: Secondary | ICD-10-CM | POA: Diagnosis not present

## 2021-10-29 DIAGNOSIS — D509 Iron deficiency anemia, unspecified: Secondary | ICD-10-CM | POA: Diagnosis not present

## 2021-10-29 DIAGNOSIS — D631 Anemia in chronic kidney disease: Secondary | ICD-10-CM | POA: Diagnosis not present

## 2021-10-29 DIAGNOSIS — Z992 Dependence on renal dialysis: Secondary | ICD-10-CM | POA: Diagnosis not present

## 2021-10-29 DIAGNOSIS — N2581 Secondary hyperparathyroidism of renal origin: Secondary | ICD-10-CM | POA: Diagnosis not present

## 2021-10-31 DIAGNOSIS — D631 Anemia in chronic kidney disease: Secondary | ICD-10-CM | POA: Diagnosis not present

## 2021-10-31 DIAGNOSIS — D509 Iron deficiency anemia, unspecified: Secondary | ICD-10-CM | POA: Diagnosis not present

## 2021-10-31 DIAGNOSIS — N2581 Secondary hyperparathyroidism of renal origin: Secondary | ICD-10-CM | POA: Diagnosis not present

## 2021-10-31 DIAGNOSIS — Z992 Dependence on renal dialysis: Secondary | ICD-10-CM | POA: Diagnosis not present

## 2021-10-31 DIAGNOSIS — N186 End stage renal disease: Secondary | ICD-10-CM | POA: Diagnosis not present

## 2021-11-03 DIAGNOSIS — N186 End stage renal disease: Secondary | ICD-10-CM | POA: Diagnosis not present

## 2021-11-03 DIAGNOSIS — N2581 Secondary hyperparathyroidism of renal origin: Secondary | ICD-10-CM | POA: Diagnosis not present

## 2021-11-03 DIAGNOSIS — D509 Iron deficiency anemia, unspecified: Secondary | ICD-10-CM | POA: Diagnosis not present

## 2021-11-03 DIAGNOSIS — D631 Anemia in chronic kidney disease: Secondary | ICD-10-CM | POA: Diagnosis not present

## 2021-11-03 DIAGNOSIS — Z992 Dependence on renal dialysis: Secondary | ICD-10-CM | POA: Diagnosis not present

## 2021-11-05 DIAGNOSIS — D631 Anemia in chronic kidney disease: Secondary | ICD-10-CM | POA: Diagnosis not present

## 2021-11-05 DIAGNOSIS — N2581 Secondary hyperparathyroidism of renal origin: Secondary | ICD-10-CM | POA: Diagnosis not present

## 2021-11-05 DIAGNOSIS — N186 End stage renal disease: Secondary | ICD-10-CM | POA: Diagnosis not present

## 2021-11-05 DIAGNOSIS — Z992 Dependence on renal dialysis: Secondary | ICD-10-CM | POA: Diagnosis not present

## 2021-11-05 DIAGNOSIS — D509 Iron deficiency anemia, unspecified: Secondary | ICD-10-CM | POA: Diagnosis not present

## 2021-11-07 DIAGNOSIS — N2581 Secondary hyperparathyroidism of renal origin: Secondary | ICD-10-CM | POA: Diagnosis not present

## 2021-11-07 DIAGNOSIS — D509 Iron deficiency anemia, unspecified: Secondary | ICD-10-CM | POA: Diagnosis not present

## 2021-11-07 DIAGNOSIS — N186 End stage renal disease: Secondary | ICD-10-CM | POA: Diagnosis not present

## 2021-11-07 DIAGNOSIS — D631 Anemia in chronic kidney disease: Secondary | ICD-10-CM | POA: Diagnosis not present

## 2021-11-07 DIAGNOSIS — Z992 Dependence on renal dialysis: Secondary | ICD-10-CM | POA: Diagnosis not present

## 2021-11-10 DIAGNOSIS — N186 End stage renal disease: Secondary | ICD-10-CM | POA: Diagnosis not present

## 2021-11-10 DIAGNOSIS — N2581 Secondary hyperparathyroidism of renal origin: Secondary | ICD-10-CM | POA: Diagnosis not present

## 2021-11-10 DIAGNOSIS — N269 Renal sclerosis, unspecified: Secondary | ICD-10-CM | POA: Diagnosis not present

## 2021-11-10 DIAGNOSIS — D509 Iron deficiency anemia, unspecified: Secondary | ICD-10-CM | POA: Diagnosis not present

## 2021-11-10 DIAGNOSIS — Z992 Dependence on renal dialysis: Secondary | ICD-10-CM | POA: Diagnosis not present

## 2021-11-10 DIAGNOSIS — D631 Anemia in chronic kidney disease: Secondary | ICD-10-CM | POA: Diagnosis not present

## 2021-11-11 ENCOUNTER — Ambulatory Visit: Payer: Medicare Other | Admitting: Orthopaedic Surgery

## 2021-11-11 DIAGNOSIS — D631 Anemia in chronic kidney disease: Secondary | ICD-10-CM | POA: Diagnosis not present

## 2021-11-11 DIAGNOSIS — I272 Pulmonary hypertension, unspecified: Secondary | ICD-10-CM | POA: Diagnosis not present

## 2021-11-11 DIAGNOSIS — M3489 Other systemic sclerosis: Secondary | ICD-10-CM | POA: Diagnosis not present

## 2021-11-11 DIAGNOSIS — E782 Mixed hyperlipidemia: Secondary | ICD-10-CM | POA: Diagnosis not present

## 2021-11-11 DIAGNOSIS — I10 Essential (primary) hypertension: Secondary | ICD-10-CM | POA: Diagnosis not present

## 2021-11-11 DIAGNOSIS — L989 Disorder of the skin and subcutaneous tissue, unspecified: Secondary | ICD-10-CM | POA: Diagnosis not present

## 2021-11-11 DIAGNOSIS — N186 End stage renal disease: Secondary | ICD-10-CM | POA: Diagnosis not present

## 2021-11-11 DIAGNOSIS — M3210 Systemic lupus erythematosus, organ or system involvement unspecified: Secondary | ICD-10-CM | POA: Diagnosis not present

## 2021-11-11 DIAGNOSIS — K7460 Unspecified cirrhosis of liver: Secondary | ICD-10-CM | POA: Diagnosis not present

## 2021-11-12 ENCOUNTER — Other Ambulatory Visit: Payer: Self-pay | Admitting: Family Medicine

## 2021-11-12 DIAGNOSIS — D631 Anemia in chronic kidney disease: Secondary | ICD-10-CM | POA: Diagnosis not present

## 2021-11-12 DIAGNOSIS — N2581 Secondary hyperparathyroidism of renal origin: Secondary | ICD-10-CM | POA: Diagnosis not present

## 2021-11-12 DIAGNOSIS — N186 End stage renal disease: Secondary | ICD-10-CM | POA: Diagnosis not present

## 2021-11-12 DIAGNOSIS — Z992 Dependence on renal dialysis: Secondary | ICD-10-CM | POA: Diagnosis not present

## 2021-11-12 DIAGNOSIS — L989 Disorder of the skin and subcutaneous tissue, unspecified: Secondary | ICD-10-CM

## 2021-11-12 DIAGNOSIS — D509 Iron deficiency anemia, unspecified: Secondary | ICD-10-CM | POA: Diagnosis not present

## 2021-11-14 DIAGNOSIS — Z992 Dependence on renal dialysis: Secondary | ICD-10-CM | POA: Diagnosis not present

## 2021-11-14 DIAGNOSIS — D509 Iron deficiency anemia, unspecified: Secondary | ICD-10-CM | POA: Diagnosis not present

## 2021-11-14 DIAGNOSIS — N186 End stage renal disease: Secondary | ICD-10-CM | POA: Diagnosis not present

## 2021-11-14 DIAGNOSIS — N2581 Secondary hyperparathyroidism of renal origin: Secondary | ICD-10-CM | POA: Diagnosis not present

## 2021-11-14 DIAGNOSIS — D631 Anemia in chronic kidney disease: Secondary | ICD-10-CM | POA: Diagnosis not present

## 2021-11-17 DIAGNOSIS — D509 Iron deficiency anemia, unspecified: Secondary | ICD-10-CM | POA: Diagnosis not present

## 2021-11-17 DIAGNOSIS — N2581 Secondary hyperparathyroidism of renal origin: Secondary | ICD-10-CM | POA: Diagnosis not present

## 2021-11-17 DIAGNOSIS — D631 Anemia in chronic kidney disease: Secondary | ICD-10-CM | POA: Diagnosis not present

## 2021-11-17 DIAGNOSIS — N186 End stage renal disease: Secondary | ICD-10-CM | POA: Diagnosis not present

## 2021-11-17 DIAGNOSIS — Z992 Dependence on renal dialysis: Secondary | ICD-10-CM | POA: Diagnosis not present

## 2021-11-18 ENCOUNTER — Inpatient Hospital Stay: Payer: Medicare Other | Attending: Oncology

## 2021-11-18 ENCOUNTER — Inpatient Hospital Stay (HOSPITAL_BASED_OUTPATIENT_CLINIC_OR_DEPARTMENT_OTHER): Payer: Medicare Other | Admitting: Oncology

## 2021-11-18 ENCOUNTER — Other Ambulatory Visit: Payer: Self-pay

## 2021-11-18 ENCOUNTER — Encounter: Payer: Self-pay | Admitting: Emergency Medicine

## 2021-11-18 ENCOUNTER — Ambulatory Visit (INDEPENDENT_AMBULATORY_CARE_PROVIDER_SITE_OTHER): Payer: Medicare Other | Admitting: Emergency Medicine

## 2021-11-18 VITALS — BP 120/70 | HR 90 | Temp 98.3°F | Ht 65.0 in | Wt 133.0 lb

## 2021-11-18 VITALS — BP 133/73 | HR 95 | Temp 98.1°F | Resp 16 | Wt 133.5 lb

## 2021-11-18 DIAGNOSIS — D693 Immune thrombocytopenic purpura: Secondary | ICD-10-CM | POA: Diagnosis not present

## 2021-11-18 DIAGNOSIS — J8410 Pulmonary fibrosis, unspecified: Secondary | ICD-10-CM

## 2021-11-18 DIAGNOSIS — K7460 Unspecified cirrhosis of liver: Secondary | ICD-10-CM | POA: Diagnosis not present

## 2021-11-18 DIAGNOSIS — D72819 Decreased white blood cell count, unspecified: Secondary | ICD-10-CM | POA: Insufficient documentation

## 2021-11-18 DIAGNOSIS — Z992 Dependence on renal dialysis: Secondary | ICD-10-CM | POA: Insufficient documentation

## 2021-11-18 DIAGNOSIS — I2721 Secondary pulmonary arterial hypertension: Secondary | ICD-10-CM

## 2021-11-18 DIAGNOSIS — R053 Chronic cough: Secondary | ICD-10-CM | POA: Diagnosis not present

## 2021-11-18 LAB — CBC WITH DIFFERENTIAL (CANCER CENTER ONLY)
Abs Immature Granulocytes: 0 10*3/uL (ref 0.00–0.07)
Basophils Absolute: 0 10*3/uL (ref 0.0–0.1)
Basophils Relative: 1 %
Eosinophils Absolute: 0 10*3/uL (ref 0.0–0.5)
Eosinophils Relative: 2 %
HCT: 32.2 % — ABNORMAL LOW (ref 36.0–46.0)
Hemoglobin: 10.3 g/dL — ABNORMAL LOW (ref 12.0–15.0)
Immature Granulocytes: 0 %
Lymphocytes Relative: 26 %
Lymphs Abs: 0.7 10*3/uL (ref 0.7–4.0)
MCH: 28.9 pg (ref 26.0–34.0)
MCHC: 32 g/dL (ref 30.0–36.0)
MCV: 90.4 fL (ref 80.0–100.0)
Monocytes Absolute: 0.4 10*3/uL (ref 0.1–1.0)
Monocytes Relative: 14 %
Neutro Abs: 1.5 10*3/uL — ABNORMAL LOW (ref 1.7–7.7)
Neutrophils Relative %: 57 %
Platelet Count: 165 10*3/uL (ref 150–400)
RBC: 3.56 MIL/uL — ABNORMAL LOW (ref 3.87–5.11)
RDW: 16.5 % — ABNORMAL HIGH (ref 11.5–15.5)
WBC Count: 2.7 10*3/uL — ABNORMAL LOW (ref 4.0–10.5)
nRBC: 0 % (ref 0.0–0.2)

## 2021-11-18 NOTE — Patient Instructions (Addendum)
We reviewed your CT scan of the chest today.  There are some very subtle evidence for chest inflammation that likely relates to your rheumatoid/scleroderma.  No overt scarring.  Please discuss this with your rheumatologist at [facility].  There may be benefit from being back on anti-inflammatory medication. We will plan to repeat your CT chest in March 2024 to ensure stability or look for any interval change that would prompt [place] to use anti-inflammatory medication. Continue your Zyrtec, nasal sprays as you have been taking them Continue to do nasal saline rinses.  Start doing these every day if possible. Wear your oxygen 3 L/min with exertion. Follow Dr. Lamonte Sakai in March after your CT so we can review the results together.

## 2021-11-18 NOTE — Progress Notes (Signed)
Subjective:    Patient ID: Katie Nunez, female    DOB: [birth year], 66 y.o.   MRN: [id number]  Cough Pertinent negatives include no ear pain, eye redness, fever, headaches, postnasal drip, rash, rhinorrhea, sore throat, shortness of breath or wheezing.   ROV 07/24/21 --66 year old woman with a history of pulmonary hypertension in the setting of connective tissue disease.  She has rheumatoid arthritis, scleroderma with associated Raynaud's phenomenon.  Also with a history of end-stage renal disease on hemodialysis, remote ITP.  No ILD on CT scan of the chest.  I also followed her for chronic cough in the setting of rhinitis. PMH significant for hepatic thrombosis, question portal vein thrombosis as well as CVA.  She is not on any anticoagulation. Her PAH is followed at [facility].  At her last visit 06/2020 she was being managed on selexipag and Ambrisentan, was off tadalafil due to hypotension at HD. Currently on ambrisentan now '10mg'$ , selexapag.  Still has a lot of nasal congestion and drainage, associated cough. She has seen some nasal blood. She is on ipratropium NS, not on nasal steroid right now. Off xyzal and now on zyrtec. Uses afrin very rarely if she has epistaxis. She has had cautery by ENT in the past. Her breathing limits her some.    ROV 11/18/21 --66 year old woman with a history of connective tissue disease, RA, scleroderma and associated Raynaud's phenomenon.  She has associated pulmonary hypertension.  Also end-stage renal disease on hemodialysis and remote ITP, CVA, portal vein thrombosis.  Her PAH is followed at [facility], currently on selexipag, Ambrisentan.  I saw her in July for chronic cough.  I added nasal steroid to Atrovent nasal spray, Zyrtec.  We also arrange for a CT scan of the chest to ensure no evolving interstitial lung disease. She has O2 that she usually uses but not always w exertion. She continues to have mucous and drainage, associated cough. She has flonase, has  intermittently used astelin and atrovent NS. Continues to have cough. She does [place] occasionally. Not currently on any anti-inflammatory meds.   CT chest 09/25/2021 reviewed by me, shows some bilateral basilar bandlike scarring that appears to be increased especially on the left.  There is a background of fine centrilobular nodular disease and mild diffuse bilateral bronchial wall thickening  Review of Systems  Constitutional:  Negative for fever and unexpected weight change.  HENT:  Positive for congestion and sneezing. Negative for dental problem, ear pain, nosebleeds, postnasal drip, rhinorrhea, sinus pressure, sore throat and trouble swallowing.   Eyes:  Negative for redness and itching.  Respiratory:  Positive for cough. Negative for chest tightness, shortness of breath and wheezing.   Cardiovascular:  Negative for palpitations and leg swelling.  Gastrointestinal:  Negative for nausea and vomiting.  Genitourinary:  Negative for dysuria.  Musculoskeletal:  Negative for joint swelling.  Skin:  Negative for rash.  Neurological:  Negative for headaches.  Hematological:  Does not bruise/bleed easily.  Psychiatric/Behavioral:  Negative for dysphoric mood. The patient is not nervous/anxious.    Past Medical History:  Diagnosis Date   Achalasia    Anxiety    Dysplasia of cervix, low grade (CIN 1)    Environmental allergies    "all year long" (12/27/2016)   ESRD (end stage renal disease) on dialysis ([place])    "TTS; [place]" (12/27/2016)   Fibromyalgia    GERD (gastroesophageal reflux disease)    Gout    Hypertension    IBS (irritable bowel syndrome)  MVP (mitral valve prolapse)    RA (rheumatoid arthritis) (HCC)    FOLLOWED BY DR. SHANAHAN   Raynaud's disease    Scleroderma ([place])    Seasonal allergies    Thrombocytopenia ([place]) 07/01/2016   Acute fall to 13,000 07/01/16   Tubular adenoma 01/08/2008   CECUM   Vitamin D deficiency      Family History  Problem Relation Age of  Onset   Hypertension Mother    Diabetes Mother    Heart disease Father    Hypertension Maternal Aunt    Diabetes Maternal Grandmother    Heart disease Paternal Grandfather    Cerebral palsy Cousin        1ST COUSIN?   Diabetes Paternal Grandmother      Social History   Socioeconomic History   Marital status: Married    Spouse name: Not on file   Number of children: Not on file   Years of education: Not on file   Highest education level: Not on file  Occupational History   Not on file  Tobacco Use   Smoking status: Never   Smokeless tobacco: Never  Vaping Use   Vaping Use: Never used  Substance and Sexual Activity   Alcohol use: No   Drug use: No   Sexual activity: Not Currently    Birth control/protection: Post-menopausal  Other Topics Concern   Not on file  Social History Narrative   Lives in [place]   Social Determinants of Health   Financial Resource Strain: Not on file  Food Insecurity: Not on file  Transportation Needs: Not on file  Physical Activity: Not on file  Stress: Not on file  Social Connections: Not on file  Intimate Partner Violence: Not on file     Allergies  Allergen Reactions   Other Anaphylaxis and Other (See Comments)    Do not use polyflux membrane.  Use alternate Other reaction(s): heart racing   Savella [Milnacipran Hcl] Palpitations and Other (See Comments)    Unknown   Tape Rash and Other (See Comments)    Itch- unsure if it was paper or adhesive tape     Outpatient Medications Prior to Visit  Medication Sig Dispense Refill   ambrisentan (LETAIRIS) 5 MG tablet Take 5 mg by mouth daily.     aspirin EC 81 MG tablet Take 1 tablet (81 mg total) by mouth daily. Swallow whole. 30 tablet 11   atorvastatin (LIPITOR) 80 MG tablet Take 1 tablet (80 mg total) by mouth daily. 90 tablet 3   azelastine (ASTELIN) 0.1 % nasal spray Place 1 spray into both nostrils 2 (two) times daily as needed for rhinitis. Use in each nostril as directed 30  mL 12   calcium acetate (PHOSLO) 667 MG capsule Take by mouth.     camphor-menthol (SARNA) lotion Apply topically 2 (two) times daily. (Patient taking differently: Apply topically as needed.) 222 mL 0   clonazePAM (KLONOPIN) 0.5 MG tablet Take 1 tablet (0.5 mg total) by mouth 2 (two) times daily as needed for anxiety. 30 tablet 0   co-enzyme Q-10 30 MG capsule Take 7 capsules (210 mg total) by mouth 3 (three) times daily. 1 capsule 1   cyclobenzaprine (FLEXERIL) 5 MG tablet Take 1 tablet (5 mg total) by mouth every 8 (eight) hours as needed for muscle spasms. 60 tablet 4   docusate sodium (COLACE) 100 MG capsule Take 2 capsules (200 mg total) by mouth daily. (Patient taking differently: Take 200 mg by mouth as  needed.) 60 capsule 0   doxycycline (VIBRA-TABS) 100 MG tablet Take 100 mg by mouth 2 (two) times daily.     famotidine (PEPCID) 20 MG tablet Take 1 tablet (20 mg total) by mouth daily as needed for heartburn or indigestion. (Patient taking differently: Take 40 mg by mouth 2 (two) times daily.) 30 tablet 0   gabapentin (NEURONTIN) 100 MG capsule Take 1 capsule (100 mg total) by mouth 3 (three) times daily. Take 1 capsule in morning and two at night 90 capsule 0   hydrocerin (EUCERIN) CREA Apply 1 application topically 2 (two) times daily. To dry skin--avoid IV site 454 g 0   hydrOXYzine (ATARAX/VISTARIL) 25 MG tablet Take 25 mg by mouth every 8 (eight) hours as needed for itching.      levocetirizine (XYZAL) 5 MG tablet SMARTSIG:1 Tablet(s) By Mouth Every Evening     lidocaine-prilocaine (EMLA) cream Apply 1 application topically as needed. Apply to toes as needed for pain 30 g 3   lubiprostone (AMITIZA) 8 MCG capsule Take 8 mcg by mouth 2 (two) times daily with a meal.     multivitamin (RENA-VIT) TABS tablet Take 1 tablet by mouth daily at 6 (six) AM. 30 tablet 0   oxymetazoline (AFRIN) 0.05 % nasal spray Place 1 spray into both nostrils 2 (two) times daily as needed for congestion. 30 mL 0    Selexipag (UPTRAVI) 800 MCG TABS Take 1 tablet (800 mcg total) by mouth in the morning and at bedtime. 60 tablet 0   sodium chloride (OCEAN) 0.65 % SOLN nasal spray Place 1 spray into both nostrils 5 (five) times daily. At least 5 times a day  0   No facility-administered medications prior to visit.        Objective:   Physical Exam Vitals:   11/18/21 1006  BP: 120/70  Pulse: 90  Temp: 98.3 F (36.8 C)  TempSrc: Oral  SpO2: 94%  Weight: 133 lb (60.3 kg)  Height: '5\' 5"'$  (1.651 m)   Gen: Pleasant, well-nourished, in no distress,  normal affect  ENT: No lesions,  mouth clear,  oropharynx clear, no postnasal drip  Neck: No JVD, no stridor  Lungs: No use of accessory muscles, decreased at bases, no wheeze or crackles.   Cardiovascular: RRR, heart sounds normal, no murmur or gallops, no peripheral edema  Musculoskeletal: No deformities, no cyanosis or clubbing  Neuro: alert, non focal  Skin: Warm, no lesions or rash     Assessment & Plan:  Postinflammatory pulmonary fibrosis (HCC) Very subtle inflammatory change present I do not see any honeycombing, UIP pattern, NSIP pattern.  Suspect this relates to her underlying autoimmune disease.  Not clear that it is enough for her to need to be on an anti-inflammatory regimen but certainly needs to be followed.  I asked her to talk to her rheumatologist at [facility] about this.  We reviewed your CT scan of the chest today.  There are some very subtle evidence for chest inflammation that likely relates to your rheumatoid/scleroderma.  No overt scarring.  Please discuss this with your rheumatologist at [facility].  There may be benefit from being back on anti-inflammatory medication. We will plan to repeat your CT chest in March 2024 to ensure stability or look for any interval change that would prompt [place] to use anti-inflammatory medication. Follow Dr. Lamonte Sakai in March after your CT so we can review the results together.  Chronic cough With impact  from her chronic rhinitis.  Her rhinitis is  still very active.  She is on Zyrtec, nasal steroid, Atrovent/Astelin nasal sprays.  She has benefited in the past from nasal saline rinses and we will try to get back on these.  Pulmonary arterial hypertension ([place]) Regimen managed at [facility].  On Ambrisentan and selexipag  Baltazar Apo, MD, PhD 11/18/2021, 10:31 AM  Pulmonary and Critical Care [phone] or if no answer [phone]

## 2021-11-18 NOTE — Assessment & Plan Note (Signed)
With impact from her chronic rhinitis.  Her rhinitis is still very active.  She is on Zyrtec, nasal steroid, Atrovent/Astelin nasal sprays.  She has benefited in the past from nasal saline rinses and we will try to get back on these.

## 2021-11-18 NOTE — Progress Notes (Signed)
Hematology and Oncology Follow Up Visit  [name] [id number] 12/11/1955 66 y.o. 11/18/2021 9:02 AM Katie Nunez, MDSmith, Hal Hope, MD   Principle Diagnosis: 66 year old woman with ITP diagnosed in February 2019.  She presented with a platelet count of 31,000 and has not received any treatment since that time after achieving complete remission.   Prior Therapy:  She was treated with corticosteroids followed by rituximab under the care of Dr. Beryle Beams with prednisone taper concluded in May 2019.  She has been in remission since that time.  Current therapy: Active surveillance.  Interim History: [name] returns today for repeat evaluation.  Since her last visit over 2 years ago, she has left frontal intracranial hemorrhage related to Coumadin with right-sided hemiparesis and continues to participate in rehab.  She has not had any recent hospitalizations or illnesses.  CBC obtained on September 08, 2021 showed a platelet count of 132 and white cell count of 2.8.  CBC in May 2023 showed a white cell count of 2.0 and platelet count of 128.  Clinically, she reports no major changes in her health.  She continues to receive dialysis without any recent complications.  She denies any hematochezia, melena or recurrent sinopulmonary infections.    Medications: Updated on review. Current Outpatient Medications  Medication Sig Dispense Refill   ambrisentan (LETAIRIS) 5 MG tablet Take 5 mg by mouth daily.     aspirin EC 81 MG tablet Take 1 tablet (81 mg total) by mouth daily. Swallow whole. 30 tablet 11   atorvastatin (LIPITOR) 80 MG tablet Take 1 tablet (80 mg total) by mouth daily. 90 tablet 3   azelastine (ASTELIN) 0.1 % nasal spray Place 1 spray into both nostrils 2 (two) times daily as needed for rhinitis. Use in each nostril as directed 30 mL 12   calcium acetate (PHOSLO) 667 MG capsule Take by mouth.     camphor-menthol (SARNA) lotion Apply topically 2 (two) times daily. (Patient  taking differently: Apply topically as needed.) 222 mL 0   clonazePAM (KLONOPIN) 0.5 MG tablet Take 1 tablet (0.5 mg total) by mouth 2 (two) times daily as needed for anxiety. 30 tablet 0   co-enzyme Q-10 30 MG capsule Take 7 capsules (210 mg total) by mouth 3 (three) times daily. 1 capsule 1   cyclobenzaprine (FLEXERIL) 5 MG tablet Take 1 tablet (5 mg total) by mouth every 8 (eight) hours as needed for muscle spasms. 60 tablet 4   docusate sodium (COLACE) 100 MG capsule Take 2 capsules (200 mg total) by mouth daily. (Patient taking differently: Take 200 mg by mouth as needed.) 60 capsule 0   doxycycline (VIBRA-TABS) 100 MG tablet Take 100 mg by mouth 2 (two) times daily.     famotidine (PEPCID) 20 MG tablet Take 1 tablet (20 mg total) by mouth daily as needed for heartburn or indigestion. (Patient taking differently: Take 40 mg by mouth 2 (two) times daily.) 30 tablet 0   gabapentin (NEURONTIN) 100 MG capsule Take 1 capsule (100 mg total) by mouth 3 (three) times daily. Take 1 capsule in morning and two at night 90 capsule 0   hydrocerin (EUCERIN) CREA Apply 1 application topically 2 (two) times daily. To dry skin--avoid IV site 454 g 0   hydrOXYzine (ATARAX/VISTARIL) 25 MG tablet Take 25 mg by mouth every 8 (eight) hours as needed for itching.      levocetirizine (XYZAL) 5 MG tablet SMARTSIG:1 Tablet(s) By Mouth Every Evening     lidocaine-prilocaine (EMLA) cream  Apply 1 application topically as needed. Apply to toes as needed for pain 30 g 3   lubiprostone (AMITIZA) 8 MCG capsule Take 8 mcg by mouth 2 (two) times daily with a meal.     multivitamin (RENA-VIT) TABS tablet Take 1 tablet by mouth daily at 6 (six) AM. 30 tablet 0   oxymetazoline (AFRIN) 0.05 % nasal spray Place 1 spray into both nostrils 2 (two) times daily as needed for congestion. 30 mL 0   Selexipag (UPTRAVI) 800 MCG TABS Take 1 tablet (800 mcg total) by mouth in the morning and at bedtime. 60 tablet 0   sodium chloride (OCEAN)  0.65 % SOLN nasal spray Place 1 spray into both nostrils 5 (five) times daily. At least 5 times a day  0   No current facility-administered medications for this visit.     Allergies:  Allergies  Allergen Reactions   Other Anaphylaxis and Other (See Comments)    Do not use polyflux membrane.  Use alternate Other reaction(s): heart racing   Savella [Milnacipran Hcl] Palpitations and Other (See Comments)    Unknown   Tape Rash and Other (See Comments)    Itch- unsure if it was paper or adhesive tape      Physical Exam: Blood pressure 133/73, pulse 95, temperature 98.1 F (36.7 C), temperature source Tympanic, resp. rate 16, weight 133 lb 8 oz (60.6 kg), last menstrual period 11/15/2008, SpO2 (!) 83 %.  ECOG: 1    General appearance: Alert, awake without any distress. Head: Atraumatic without abnormalities Oropharynx: Without any thrush or ulcers. Eyes: No scleral icterus. Lymph nodes: No lymphadenopathy noted in the cervical, supraclavicular, or axillary nodes Heart:regular rate and rhythm, without any murmurs or gallops.   Lung: Clear to auscultation without any rhonchi, wheezes or dullness to percussion. Abdomin: Soft, nontender without any shifting dullness or ascites. Musculoskeletal: No clubbing or cyanosis. Neurological: No motor or sensory deficits. Skin: No rashes or lesions.      Lab Results: Lab Results  Component Value Date   WBC 3.5 (L) 01/22/2020   HGB 10.5 (L) 01/22/2020   HCT 32.3 (L) 01/22/2020   MCV 91.2 01/22/2020   PLT 189 01/22/2020     Chemistry      Component Value Date/Time   NA 140 06/10/2021 1635   K 4.0 06/10/2021 1635   CL 93 (L) 06/10/2021 1635   CO2 28 06/10/2021 1635   BUN 39 (H) 06/10/2021 1635   CREATININE 7.77 (H) 06/10/2021 1635      Component Value Date/Time   CALCIUM 9.9 06/10/2021 1635   ALKPHOS 108 06/10/2021 1635   AST 21 06/10/2021 1635   ALT 16 06/10/2021 1635   BILITOT 0.3 06/10/2021 1635          Impression and Plan:  66 year old woman with:   1.  Thrombocytopenia dating back to 2019.  She presented with a platelet count of 30,000 and presumed ITP.  She was treated with steroids and rituximab with platelet count that is adequate above 100,000 without any additional treatment.  Laboratory data in the last 3 years were reviewed and persistently showed platelet count that is adequate and does not require any treatment or relapse of her ITP.   CBC from today showed a platelet count of 165 without any need for intervention at this time.       2.  Leukocytopenia: Continues to be chronic and fluctuating without any evidence to suggest a hematological disorder.  These findings are dating back  to 2018 with a normal differential.  She has a cirrhosis of the liver likely splenic sequestration is responsible for her mild fluctuating leukocytopenia.  I do not see any need for growth factor support or further hematological evaluation including a bone marrow biopsy.  3.  Follow-up: Will be as needed in the future.   30 minutes dedicated to this visit.  The time was spent on updating disease status, laboratory data review, treatment choices and outlining future plan of care discussion.     Zola Button, MD 11/8/20239:02 AM

## 2021-11-18 NOTE — Addendum Note (Signed)
Addended by: Gavin Potters R on: 11/18/2021 10:39 AM   Modules accepted: Orders

## 2021-11-18 NOTE — Assessment & Plan Note (Signed)
Very subtle inflammatory change present I do not see any honeycombing, UIP pattern, NSIP pattern.  Suspect this relates to her underlying autoimmune disease.  Not clear that it is enough for her to need to be on an anti-inflammatory regimen but certainly needs to be followed.  I asked her to talk to her rheumatologist at [facility] about this.  We reviewed your CT scan of the chest today.  There are some very subtle evidence for chest inflammation that likely relates to your rheumatoid/scleroderma.  No overt scarring.  Please discuss this with your rheumatologist at [facility].  There may be benefit from being back on anti-inflammatory medication. We will plan to repeat your CT chest in March 2024 to ensure stability or look for any interval change that would prompt [place] to use anti-inflammatory medication. Follow Dr. Lamonte Sakai in March after your CT so we can review the results together.

## 2021-11-18 NOTE — Assessment & Plan Note (Signed)
Regimen managed at [facility].  On Ambrisentan and selexipag

## 2021-11-19 DIAGNOSIS — D631 Anemia in chronic kidney disease: Secondary | ICD-10-CM | POA: Diagnosis not present

## 2021-11-19 DIAGNOSIS — Z992 Dependence on renal dialysis: Secondary | ICD-10-CM | POA: Diagnosis not present

## 2021-11-19 DIAGNOSIS — R0609 Other forms of dyspnea: Secondary | ICD-10-CM | POA: Diagnosis not present

## 2021-11-19 DIAGNOSIS — N2581 Secondary hyperparathyroidism of renal origin: Secondary | ICD-10-CM | POA: Diagnosis not present

## 2021-11-19 DIAGNOSIS — N186 End stage renal disease: Secondary | ICD-10-CM | POA: Diagnosis not present

## 2021-11-19 DIAGNOSIS — I270 Primary pulmonary hypertension: Secondary | ICD-10-CM | POA: Diagnosis not present

## 2021-11-19 DIAGNOSIS — D509 Iron deficiency anemia, unspecified: Secondary | ICD-10-CM | POA: Diagnosis not present

## 2021-11-21 DIAGNOSIS — N186 End stage renal disease: Secondary | ICD-10-CM | POA: Diagnosis not present

## 2021-11-21 DIAGNOSIS — D631 Anemia in chronic kidney disease: Secondary | ICD-10-CM | POA: Diagnosis not present

## 2021-11-21 DIAGNOSIS — D509 Iron deficiency anemia, unspecified: Secondary | ICD-10-CM | POA: Diagnosis not present

## 2021-11-21 DIAGNOSIS — Z992 Dependence on renal dialysis: Secondary | ICD-10-CM | POA: Diagnosis not present

## 2021-11-21 DIAGNOSIS — N2581 Secondary hyperparathyroidism of renal origin: Secondary | ICD-10-CM | POA: Diagnosis not present

## 2021-11-24 DIAGNOSIS — D509 Iron deficiency anemia, unspecified: Secondary | ICD-10-CM | POA: Diagnosis not present

## 2021-11-24 DIAGNOSIS — N2581 Secondary hyperparathyroidism of renal origin: Secondary | ICD-10-CM | POA: Diagnosis not present

## 2021-11-24 DIAGNOSIS — Z992 Dependence on renal dialysis: Secondary | ICD-10-CM | POA: Diagnosis not present

## 2021-11-24 DIAGNOSIS — N186 End stage renal disease: Secondary | ICD-10-CM | POA: Diagnosis not present

## 2021-11-24 DIAGNOSIS — D631 Anemia in chronic kidney disease: Secondary | ICD-10-CM | POA: Diagnosis not present

## 2021-11-25 ENCOUNTER — Encounter: Payer: Self-pay | Admitting: Orthopaedic Surgery

## 2021-11-25 ENCOUNTER — Ambulatory Visit (INDEPENDENT_AMBULATORY_CARE_PROVIDER_SITE_OTHER): Payer: Medicare Other | Admitting: Podiatrist

## 2021-11-25 ENCOUNTER — Encounter: Payer: Self-pay | Admitting: Podiatrist

## 2021-11-25 ENCOUNTER — Ambulatory Visit (INDEPENDENT_AMBULATORY_CARE_PROVIDER_SITE_OTHER): Payer: Medicare Other | Admitting: Orthopaedic Surgery

## 2021-11-25 VITALS — BP 122/76 | HR 80 | Ht 65.0 in | Wt 133.0 lb

## 2021-11-25 DIAGNOSIS — M2012 Hallux valgus (acquired), left foot: Secondary | ICD-10-CM

## 2021-11-25 DIAGNOSIS — M25471 Effusion, right ankle: Secondary | ICD-10-CM

## 2021-11-25 DIAGNOSIS — L84 Corns and callosities: Secondary | ICD-10-CM

## 2021-11-25 DIAGNOSIS — M7750 Other enthesopathy of unspecified foot: Secondary | ICD-10-CM

## 2021-11-25 MED ORDER — CYCLOBENZAPRINE HCL 5 MG PO TABS: 5.0000 mg | ORAL_TABLET | Freq: Three times a day (TID) | ORAL | 0 refills | Status: AC | PRN

## 2021-11-25 MED ORDER — NITROGLYCERIN 0.2 MG/HR TD PT24: 0.2000 mg | MEDICATED_PATCH | Freq: Every day | TRANSDERMAL | 4 refills | Status: DC

## 2021-11-25 NOTE — Progress Notes (Unsigned)
Office Visit Note   Patient: Katie Nunez           Date of Birth: Jan 09, 1956           MRN: [id number] Visit Date: 11/25/2021                                                      Requested by: [name], [place],  [place] [address] PCP: [name], MD   Assessment & Plan: Visit Diagnoses: No diagnosis found.  Plan: ***  Follow-Up Instructions: No follow-ups on file.   Orders:  No orders of the defined types were placed in this encounter.  No orders of the defined types were placed in this encounter.     Procedures: No procedures performed   Clinical Data: No additional findings.   Subjective: Chief Complaint  Patient presents with   Right Ankle - Pain   Right Leg - Pain   Left Leg - Pain   Neck - Pain    HPI  Review of Systems   Objective: Vital Signs: BP 122/76   Pulse 80   Ht _0  (1.651 m)   Wt 133 lb (60.3 kg)   LMP 11/15/2008   BMI 22.13 kg/m   Physical Exam  Ortho Exam  Specialty Comments:  No specialty comments available.  Imaging: No results found.   PMFS History: Patient Active Problem List   Diagnosis Date Noted   Neck pain 10/30/2020   Knee locking, left 10/05/2020   Abnormality of gait 02/25/2020   Protein-calorie malnutrition, severe 01/19/2020   Epistaxis    Sleep disturbance    Slow transit constipation    Hemorrhoids    Chronic systolic congestive heart failure ([place])    ESRD on dialysis ([place])    Right hemiparesis ([place])    Pressure injury of skin 12/20/2019   Intraparenchymal hemorrhage of brain ([place]) 12/18/2019   ICH (intracerebral hemorrhage) ([place]) 12/12/2019   Viral disease 08/22/2019   Chronic right-sided heart failure ([place]) 07/09/2019   Supraventricular tachycardia 07/09/2019   Other cirrhosis of liver ([place]) 06/27/2019   Heart failure ([place]) 02/21/2019   Heart palpitations 08/14/2018   Other fluid overload 07/27/2018   Encounter for long-term (current) use of  other medications 01/18/2018   Hypothyroidism 01/18/2018   Myalgia and myositis 01/18/2018   Chronic nephritis 01/18/2018   Other long term (current) drug therapy 01/18/2018   Sleep apnea 01/18/2018   Unspecified persistent mental disorders due to conditions classified elsewhere 01/18/2018   Venous reflux 01/18/2018   Vitamin B12 deficiency 01/18/2018   Vitamin D deficiency 01/18/2018   Obstructive lung disease ([place]) 12/30/2017   Other pruritus 12/27/2017   Eruption cyst 12/19/2017   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 ([place]) 11/28/2017   Chronic cough 11/11/2017   Pulmonary hypertension ([place]) 11/11/2017   Pulmonary arterial hypertension ([place]) 10/07/2017   Acute ITP ([place]) 01/05/2017   ESRD (end stage renal disease) ([place]) 12/28/2016   Encounter for removal of sutures 09/09/2016   Coagulation defect, unspecified ([place]) 09/01/2016   Underimmunization status 08/05/2016   Hemolytic anemia ([place]) 07/26/2016   Epistaxis, recurrent 07/26/2016   Unspecified protein-calorie malnutrition ([place]) 07/13/2016   Aftercare including intermittent dialysis ([place]) 07/07/2016   Anemia in chronic kidney disease 07/07/2016   Hypokalemia 07/07/2016  Iron deficiency anemia, unspecified 07/07/2016   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   Nonrheumatic mitral (valve) prolapse 07/07/2016   Other irritable bowel syndrome 07/07/2016   Other secondary thrombocytopenia 07/07/2016   Secondary hyperparathyroidism of renal origin ([place]) 07/07/2016   Thrombocytopenia ([place]) 07/01/2016   ARF (acute renal failure) ([place]) 06/25/2016   Anxiety 06/25/2016   Bilateral carpal tunnel syndrome 10/22/2015   Chronic gout without tophus 10/22/2015   Chronic nonintractable headache 10/08/2015   Fibroid uterus 01/03/2012   H/O vitamin D deficiency 01/03/2012   Post-menopausal 01/03/2012   Hereditary and idiopathic peripheral neuropathy 11/19/2011   Intestinal malabsorption 57/84/6962   Lichen planus  95/28/4132   Low back pain 11/19/2011   Diffuse spasm of esophagus 11/11/2011   ESR raised 11/11/2011   Postinflammatory pulmonary fibrosis ([place]) 11/11/2011   Scleroderma ([place]) 11/16/2010   Rheumatoid arthritis ([place]) 11/16/2010   Raynaud's disease 11/16/2010   Symptomatic menopausal or female climacteric states 11/16/2010   Past Medical History:  Diagnosis Date   Achalasia    Anxiety    Dysplasia of cervix, low grade (CIN 1)    Environmental allergies    "all year long" (12/27/2016)   ESRD (end stage renal disease) on dialysis ([place])    "TTS; [facility]" (12/27/2016)   Fibromyalgia    GERD (gastroesophageal reflux disease)    Gout    Hypertension    IBS (irritable bowel syndrome)    MVP (mitral valve prolapse)    RA (rheumatoid arthritis) ([place])    FOLLOWED BY DR. SHANAHAN   Raynaud's disease    Scleroderma ([place])    Seasonal allergies    Thrombocytopenia ([place]) 07/01/2016   Acute fall to 13,000 07/01/16   Tubular adenoma 01/08/2008   CECUM   Vitamin D deficiency     Family History  Problem Relation Age of Onset   Hypertension Mother    Diabetes Mother    Heart disease Father    Hypertension Maternal Aunt    Diabetes Maternal Grandmother    Heart disease Paternal Grandfather    Cerebral palsy Cousin        1ST COUSIN?   Diabetes Paternal Grandmother     Past Surgical History:  Procedure Laterality Date   ANKLE FRACTURE SURGERY Right    AV FISTULA PLACEMENT Left 06/28/2016   Procedure: left arm ARTERIOVENOUS (AV) FISTULA CREATION;  Surgeon: Rosetta Posner, MD;  Location: [facility] OR;  Service: Vascular;  Laterality: Left;   [place] Left 09/27/2016   Procedure: LEFT UPPER ARM CEPHALIC VEIN TRANSPOSITION;  Surgeon: Rosetta Posner, MD;  Location: [facility] OR;  Service: Vascular;  Laterality: Left;   BREAST BIOPSY     "? side"   [place]     COLONOSCOPY W/ BIOPSIES  01/08/2008   INSERTION OF DIALYSIS CATHETER Right 06/28/2016    Procedure: INSERTION OF DIALYSIS CATHETER, right internal jugular;  Surgeon: Rosetta Posner, MD;  Location: [facility];  Service: Vascular;  Laterality: Right;   MYOMECTOMY     NASAL ENDOSCOPY WITH EPISTAXIS CONTROL N/A 12/29/2019   Procedure: NASAL ENDOSCOPY WITH EPISTAXIS CONTROL;  Surgeon: Leta Baptist, MD;  Location: [facility];  Service: ENT;  Laterality: N/A;   PELVIC LAPAROSCOPY  2011   superficial thrombophlebitis Left 07-2014   Social History   Occupational History   Not on file  Tobacco Use   Smoking status: Never   Smokeless tobacco: Never  Vaping Use   Vaping Use: Never used  Substance and Sexual Activity   Alcohol use: No   Drug use: No   Sexual activity: Not Currently    Birth control/protection: Post-menopausal

## 2021-11-25 NOTE — Progress Notes (Signed)
Chief Complaint  Patient presents with   Callouses    Patient came in today for bilateral callus, and pain on the top and ankles, burning pins and needles, patient does have some swelling in the feet and ankles,        HPI: Patient is 66 y.o. female who presents today with her husband for  for pain on the top of her feet and ankles as well as pain in her great toes.  She relates her toenails feel ingrown and tight although they don't appear ingrown. She also relates some cramping in her feet.  She relates she gets calluses on bilateral bunion areas which are bothersome.  She has rheumatoid arthritis as well as scleroderma as well.   She had a circulation study done a few months back which showed triphasic normal blood flow with the exception of her great toes where no reading was reported.   Patient Active Problem List   Diagnosis Date Noted   Neck pain 10/30/2020   Knee locking, left 10/05/2020   Abnormality of gait 02/25/2020   Protein-calorie malnutrition, severe 01/19/2020   Epistaxis    Sleep disturbance    Slow transit constipation    Hemorrhoids    Chronic systolic congestive heart failure ([place])    ESRD on dialysis ([place])    Right hemiparesis ([place])    Pressure injury of skin 12/20/2019   Intraparenchymal hemorrhage of brain ([place]) 12/18/2019   ICH (intracerebral hemorrhage) ([place]) 12/12/2019   Viral disease 08/22/2019   Chronic right-sided heart failure ([place]) 07/09/2019   Supraventricular tachycardia 07/09/2019   Other cirrhosis of liver ([place]) 06/27/2019   Heart failure ([place]) 02/21/2019   Heart palpitations 08/14/2018   Other fluid overload 07/27/2018   Encounter for long-term (current) use of other medications 01/18/2018   Hypothyroidism 01/18/2018   Myalgia and myositis 01/18/2018   Chronic nephritis 01/18/2018   Other long term (current) drug therapy 01/18/2018   Sleep apnea 01/18/2018   Unspecified persistent mental disorders due to conditions classified elsewhere  01/18/2018   Venous reflux 01/18/2018   Vitamin B12 deficiency 01/18/2018   Vitamin D deficiency 01/18/2018   Obstructive lung disease ([place]) 12/30/2017   Other pruritus 12/27/2017   Eruption cyst 12/19/2017   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 ([place]) 11/28/2017   Chronic cough 11/11/2017   Pulmonary hypertension ([place]) 11/11/2017   Pulmonary arterial hypertension ([place]) 10/07/2017   Acute ITP ([place]) 01/05/2017   ESRD (end stage renal disease) ([place]) 12/28/2016   Encounter for removal of sutures 09/09/2016   Coagulation defect, unspecified ([place]) 09/01/2016   Underimmunization status 08/05/2016   Hemolytic anemia ([place]) 07/26/2016   Epistaxis, recurrent 07/26/2016   Unspecified protein-calorie malnutrition ([place]) 07/13/2016   Aftercare including intermittent dialysis ([place]) 07/07/2016   Anemia in chronic kidney disease 07/07/2016   Hypokalemia 07/07/2016   Iron deficiency anemia, unspecified 07/07/2016   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   Nonrheumatic mitral (valve) prolapse 07/07/2016   Other irritable bowel syndrome 07/07/2016   Other secondary thrombocytopenia 07/07/2016   Secondary hyperparathyroidism of renal origin ([place]) 07/07/2016   Thrombocytopenia ([place]) 07/01/2016   ARF (acute renal failure) ([place]) 06/25/2016   Anxiety 06/25/2016   Bilateral carpal tunnel syndrome 10/22/2015   Chronic gout without tophus 10/22/2015   Chronic nonintractable headache 10/08/2015   Fibroid uterus 01/03/2012   H/O vitamin D deficiency 01/03/2012   Post-menopausal 01/03/2012   Hereditary and idiopathic peripheral neuropathy 11/19/2011   Intestinal malabsorption 63/78/5885   Lichen planus 02/77/4128   Low  back pain 11/19/2011   Diffuse spasm of esophagus 11/11/2011   ESR raised 11/11/2011   Postinflammatory pulmonary fibrosis ([place]) 11/11/2011   Scleroderma ([place]) 11/16/2010   Rheumatoid arthritis ([place]) 11/16/2010   Raynaud's disease 11/16/2010   Symptomatic  menopausal or female climacteric states 11/16/2010    Current Outpatient Medications on File Prior to Visit  Medication Sig Dispense Refill   ambrisentan (LETAIRIS) 5 MG tablet Take 5 mg by mouth daily.     aspirin EC 81 MG tablet Take 1 tablet (81 mg total) by mouth daily. Swallow whole. 30 tablet 11   atorvastatin (LIPITOR) 80 MG tablet Take 1 tablet (80 mg total) by mouth daily. 90 tablet 3   azelastine (ASTELIN) 0.1 % nasal spray Place 1 spray into both nostrils 2 (two) times daily as needed for rhinitis. Use in each nostril as directed 30 mL 12   calcium acetate (PHOSLO) 667 MG capsule Take by mouth.     camphor-menthol (SARNA) lotion Apply topically 2 (two) times daily. (Patient taking differently: Apply topically as needed.) 222 mL 0   clonazePAM (KLONOPIN) 0.5 MG tablet Take 1 tablet (0.5 mg total) by mouth 2 (two) times daily as needed for anxiety. 30 tablet 0   co-enzyme Q-10 30 MG capsule Take 7 capsules (210 mg total) by mouth 3 (three) times daily. 1 capsule 1   cyclobenzaprine (FLEXERIL) 5 MG tablet Take 1 tablet (5 mg total) by mouth every 8 (eight) hours as needed for muscle spasms. 60 tablet 4   docusate sodium (COLACE) 100 MG capsule Take 2 capsules (200 mg total) by mouth daily. (Patient taking differently: Take 200 mg by mouth as needed.) 60 capsule 0   doxycycline (VIBRA-TABS) 100 MG tablet Take 100 mg by mouth 2 (two) times daily.     famotidine (PEPCID) 20 MG tablet Take 1 tablet (20 mg total) by mouth daily as needed for heartburn or indigestion. (Patient taking differently: Take 40 mg by mouth 2 (two) times daily.) 30 tablet 0   gabapentin (NEURONTIN) 100 MG capsule Take 1 capsule (100 mg total) by mouth 3 (three) times daily. Take 1 capsule in morning and two at night 90 capsule 0   hydrocerin (EUCERIN) CREA Apply 1 application topically 2 (two) times daily. To dry skin--avoid IV site 454 g 0   hydrOXYzine (ATARAX/VISTARIL) 25 MG tablet Take 25 mg by mouth every 8 (eight)  hours as needed for itching.      levocetirizine (XYZAL) 5 MG tablet SMARTSIG:1 Tablet(s) By Mouth Every Evening     lidocaine-prilocaine (EMLA) cream Apply 1 application topically as needed. Apply to toes as needed for pain 30 g 3   lubiprostone (AMITIZA) 8 MCG capsule Take 8 mcg by mouth 2 (two) times daily with a meal.     multivitamin (RENA-VIT) TABS tablet Take 1 tablet by mouth daily at 6 (six) AM. 30 tablet 0   oxymetazoline (AFRIN) 0.05 % nasal spray Place 1 spray into both nostrils 2 (two) times daily as needed for congestion. 30 mL 0   Selexipag (UPTRAVI) 800 MCG TABS Take 1 tablet (800 mcg total) by mouth in the morning and at bedtime. 60 tablet 0   sodium chloride (OCEAN) 0.65 % SOLN nasal spray Place 1 spray into both nostrils 5 (five) times daily. At least 5 times a day  0   No current facility-administered medications on file prior to visit.    Allergies  Allergen Reactions   Other Anaphylaxis and Other (See Comments)  Do not use polyflux membrane.  Use alternate Other reaction(s): heart racing   Savella [Milnacipran Hcl] Palpitations and Other (See Comments)    Unknown   Tape Rash and Other (See Comments)    Itch- unsure if it was paper or adhesive tape    Review of Systems No fevers, chills, nausea, muscle aches, no difficulty breathing, no calf pain, no chest pain or shortness of breath.   Physical Exam  Anette is awake, alert, and oriented x 3.  In no acute distress.     Vascular status is intact with palpable pedal pulses 2/4 DP and 1/4 PT bilateral and capillary refill time less than 3 seconds bilateral.  edema in the right ankle is noted.  Capillary refill time slightly delayed to bilateral hallux.  Neurological exam reveals epicritic and protective sensation grossly intact bilateral.    Dermatological exam reveals skin is mildly xerotic to bilateral feet.  No open lesions present.  A large callus is present on the left hallux medial pinch callus as well as  the left bunion prominence as well as the right hallux/ pinch callus.   No sign of open lesions or ulcerations noted.  Bilateral hallux nails are slightly elongated, some callus tissue on the borders of the toenails noted. No sign of infection present, no sign of ingrown present.    Musculoskeletal exam: moderate bunion deformity with lateral orientation of the hallux left noted.   First MPJ joint range of motion decreased.  pain with cramping of toes reported bilateral     Assessment     ICD-10-CM   1. Tendonitis of foot  M77.50     2. Corns and callosities  L84     3. Hallux abducto valgus, left  M20.12        Plan Exam findings and treatment recommendations are discussed.   Discussed her recent vascular test showed no appreciable flow to the great toes.  Recommended nitroglyerin patches to see if this would help with pain and blood flow to the toes.  Also discussed trying flexeril for the foot cramping.  She has used flexeril in the past and relates it does well for her.  A refill of this was written and she will use as needed for cramps.  Also trimmed her calluses with a  15 blade to healthy underlying integument. No bleeding present.  A bunion shield was dispensed as well as silicone pads to wear on the bottom of the great toes.  Great toenails trimmed without incident today as well.  She will return if she continues to have pain.

## 2021-11-26 DIAGNOSIS — D509 Iron deficiency anemia, unspecified: Secondary | ICD-10-CM | POA: Diagnosis not present

## 2021-11-26 DIAGNOSIS — Z992 Dependence on renal dialysis: Secondary | ICD-10-CM | POA: Diagnosis not present

## 2021-11-26 DIAGNOSIS — D631 Anemia in chronic kidney disease: Secondary | ICD-10-CM | POA: Diagnosis not present

## 2021-11-26 DIAGNOSIS — N186 End stage renal disease: Secondary | ICD-10-CM | POA: Diagnosis not present

## 2021-11-26 DIAGNOSIS — M25473 Effusion, unspecified ankle: Secondary | ICD-10-CM | POA: Insufficient documentation

## 2021-11-26 DIAGNOSIS — N2581 Secondary hyperparathyroidism of renal origin: Secondary | ICD-10-CM | POA: Diagnosis not present

## 2021-11-28 DIAGNOSIS — D509 Iron deficiency anemia, unspecified: Secondary | ICD-10-CM | POA: Diagnosis not present

## 2021-11-28 DIAGNOSIS — Z992 Dependence on renal dialysis: Secondary | ICD-10-CM | POA: Diagnosis not present

## 2021-11-28 DIAGNOSIS — D631 Anemia in chronic kidney disease: Secondary | ICD-10-CM | POA: Diagnosis not present

## 2021-11-28 DIAGNOSIS — N186 End stage renal disease: Secondary | ICD-10-CM | POA: Diagnosis not present

## 2021-11-28 DIAGNOSIS — N2581 Secondary hyperparathyroidism of renal origin: Secondary | ICD-10-CM | POA: Diagnosis not present

## 2021-11-30 DIAGNOSIS — D509 Iron deficiency anemia, unspecified: Secondary | ICD-10-CM | POA: Diagnosis not present

## 2021-11-30 DIAGNOSIS — N186 End stage renal disease: Secondary | ICD-10-CM | POA: Diagnosis not present

## 2021-11-30 DIAGNOSIS — N2581 Secondary hyperparathyroidism of renal origin: Secondary | ICD-10-CM | POA: Diagnosis not present

## 2021-11-30 DIAGNOSIS — Z992 Dependence on renal dialysis: Secondary | ICD-10-CM | POA: Diagnosis not present

## 2021-11-30 DIAGNOSIS — D631 Anemia in chronic kidney disease: Secondary | ICD-10-CM | POA: Diagnosis not present

## 2021-12-02 ENCOUNTER — Telehealth: Payer: Self-pay | Admitting: Emergency Medicine

## 2021-12-02 DIAGNOSIS — D509 Iron deficiency anemia, unspecified: Secondary | ICD-10-CM | POA: Diagnosis not present

## 2021-12-02 DIAGNOSIS — N186 End stage renal disease: Secondary | ICD-10-CM | POA: Diagnosis not present

## 2021-12-02 DIAGNOSIS — D631 Anemia in chronic kidney disease: Secondary | ICD-10-CM | POA: Diagnosis not present

## 2021-12-02 DIAGNOSIS — N2581 Secondary hyperparathyroidism of renal origin: Secondary | ICD-10-CM | POA: Diagnosis not present

## 2021-12-02 DIAGNOSIS — Z992 Dependence on renal dialysis: Secondary | ICD-10-CM | POA: Diagnosis not present

## 2021-12-02 MED ORDER — ALBUTEROL SULFATE HFA 108 (90 BASE) MCG/ACT IN AERS: 2.0000 | INHALATION_SPRAY | Freq: Four times a day (QID) | RESPIRATORY_TRACT | 6 refills | Status: DC | PRN

## 2021-12-02 NOTE — Telephone Encounter (Signed)
Called and spoke to patient and went over recommendations from Dr Lamonte Sakai. She verbalized understanding and we went over pharmacy over the phone. Nothing further needed

## 2021-12-02 NOTE — Telephone Encounter (Signed)
I am okay giving her prescription for albuterol to try using 2 puffs as needed.  That said if she continues to have shortness of breath I think she needs to come in to be seen for an acute visit by APP because her principal problem has been pulmonary hypertension, not obstructive lung disease.  Please have her call us back if her breathing does not improve.

## 2021-12-02 NOTE — Telephone Encounter (Signed)
Called patient and she is asking for a possible inhaler due to her having slight SOB. She states she had an inhaler a long time ago from Dr Lamonte Sakai. She is asking if she can get an inhaler   Please advise sir

## 2021-12-05 DIAGNOSIS — D631 Anemia in chronic kidney disease: Secondary | ICD-10-CM | POA: Diagnosis not present

## 2021-12-05 DIAGNOSIS — N186 End stage renal disease: Secondary | ICD-10-CM | POA: Diagnosis not present

## 2021-12-05 DIAGNOSIS — Z992 Dependence on renal dialysis: Secondary | ICD-10-CM | POA: Diagnosis not present

## 2021-12-05 DIAGNOSIS — N2581 Secondary hyperparathyroidism of renal origin: Secondary | ICD-10-CM | POA: Diagnosis not present

## 2021-12-05 DIAGNOSIS — D509 Iron deficiency anemia, unspecified: Secondary | ICD-10-CM | POA: Diagnosis not present

## 2021-12-07 ENCOUNTER — Ambulatory Visit
Admission: RE | Admit: 2021-12-07 | Discharge: 2021-12-07 | Disposition: A | Discharge status: Home / Self Care | Payer: Medicare Other | Source: Ambulatory Visit | Attending: Family Medicine | Admitting: Family Medicine

## 2021-12-07 DIAGNOSIS — L989 Disorder of the skin and subcutaneous tissue, unspecified: Secondary | ICD-10-CM

## 2021-12-07 DIAGNOSIS — E079 Disorder of thyroid, unspecified: Secondary | ICD-10-CM | POA: Diagnosis not present

## 2021-12-08 DIAGNOSIS — D631 Anemia in chronic kidney disease: Secondary | ICD-10-CM | POA: Diagnosis not present

## 2021-12-08 DIAGNOSIS — D509 Iron deficiency anemia, unspecified: Secondary | ICD-10-CM | POA: Diagnosis not present

## 2021-12-08 DIAGNOSIS — N2581 Secondary hyperparathyroidism of renal origin: Secondary | ICD-10-CM | POA: Diagnosis not present

## 2021-12-08 DIAGNOSIS — Z992 Dependence on renal dialysis: Secondary | ICD-10-CM | POA: Diagnosis not present

## 2021-12-08 DIAGNOSIS — N186 End stage renal disease: Secondary | ICD-10-CM | POA: Diagnosis not present

## 2021-12-10 DIAGNOSIS — D509 Iron deficiency anemia, unspecified: Secondary | ICD-10-CM | POA: Diagnosis not present

## 2021-12-10 DIAGNOSIS — N269 Renal sclerosis, unspecified: Secondary | ICD-10-CM | POA: Diagnosis not present

## 2021-12-10 DIAGNOSIS — N186 End stage renal disease: Secondary | ICD-10-CM | POA: Diagnosis not present

## 2021-12-10 DIAGNOSIS — Z992 Dependence on renal dialysis: Secondary | ICD-10-CM | POA: Diagnosis not present

## 2021-12-10 DIAGNOSIS — N2581 Secondary hyperparathyroidism of renal origin: Secondary | ICD-10-CM | POA: Diagnosis not present

## 2021-12-10 DIAGNOSIS — D631 Anemia in chronic kidney disease: Secondary | ICD-10-CM | POA: Diagnosis not present

## 2021-12-12 DIAGNOSIS — N186 End stage renal disease: Secondary | ICD-10-CM | POA: Diagnosis not present

## 2021-12-12 DIAGNOSIS — Z992 Dependence on renal dialysis: Secondary | ICD-10-CM | POA: Diagnosis not present

## 2021-12-12 DIAGNOSIS — N2581 Secondary hyperparathyroidism of renal origin: Secondary | ICD-10-CM | POA: Diagnosis not present

## 2021-12-12 DIAGNOSIS — D509 Iron deficiency anemia, unspecified: Secondary | ICD-10-CM | POA: Diagnosis not present

## 2021-12-12 DIAGNOSIS — D631 Anemia in chronic kidney disease: Secondary | ICD-10-CM | POA: Diagnosis not present

## 2021-12-15 DIAGNOSIS — N2581 Secondary hyperparathyroidism of renal origin: Secondary | ICD-10-CM | POA: Diagnosis not present

## 2021-12-15 DIAGNOSIS — D509 Iron deficiency anemia, unspecified: Secondary | ICD-10-CM | POA: Diagnosis not present

## 2021-12-15 DIAGNOSIS — D631 Anemia in chronic kidney disease: Secondary | ICD-10-CM | POA: Diagnosis not present

## 2021-12-15 DIAGNOSIS — Z992 Dependence on renal dialysis: Secondary | ICD-10-CM | POA: Diagnosis not present

## 2021-12-15 DIAGNOSIS — N186 End stage renal disease: Secondary | ICD-10-CM | POA: Diagnosis not present

## 2021-12-17 DIAGNOSIS — N2581 Secondary hyperparathyroidism of renal origin: Secondary | ICD-10-CM | POA: Diagnosis not present

## 2021-12-17 DIAGNOSIS — Z992 Dependence on renal dialysis: Secondary | ICD-10-CM | POA: Diagnosis not present

## 2021-12-17 DIAGNOSIS — D631 Anemia in chronic kidney disease: Secondary | ICD-10-CM | POA: Diagnosis not present

## 2021-12-17 DIAGNOSIS — D509 Iron deficiency anemia, unspecified: Secondary | ICD-10-CM | POA: Diagnosis not present

## 2021-12-17 DIAGNOSIS — N186 End stage renal disease: Secondary | ICD-10-CM | POA: Diagnosis not present

## 2021-12-19 DIAGNOSIS — Z992 Dependence on renal dialysis: Secondary | ICD-10-CM | POA: Diagnosis not present

## 2021-12-19 DIAGNOSIS — I270 Primary pulmonary hypertension: Secondary | ICD-10-CM | POA: Diagnosis not present

## 2021-12-19 DIAGNOSIS — R0609 Other forms of dyspnea: Secondary | ICD-10-CM | POA: Diagnosis not present

## 2021-12-19 DIAGNOSIS — D631 Anemia in chronic kidney disease: Secondary | ICD-10-CM | POA: Diagnosis not present

## 2021-12-19 DIAGNOSIS — N186 End stage renal disease: Secondary | ICD-10-CM | POA: Diagnosis not present

## 2021-12-19 DIAGNOSIS — D509 Iron deficiency anemia, unspecified: Secondary | ICD-10-CM | POA: Diagnosis not present

## 2021-12-19 DIAGNOSIS — N2581 Secondary hyperparathyroidism of renal origin: Secondary | ICD-10-CM | POA: Diagnosis not present

## 2021-12-22 ENCOUNTER — Other Ambulatory Visit: Payer: Self-pay | Admitting: Family Medicine

## 2021-12-22 DIAGNOSIS — N2581 Secondary hyperparathyroidism of renal origin: Secondary | ICD-10-CM | POA: Diagnosis not present

## 2021-12-22 DIAGNOSIS — D509 Iron deficiency anemia, unspecified: Secondary | ICD-10-CM | POA: Diagnosis not present

## 2021-12-22 DIAGNOSIS — R221 Localized swelling, mass and lump, neck: Secondary | ICD-10-CM

## 2021-12-22 DIAGNOSIS — Z992 Dependence on renal dialysis: Secondary | ICD-10-CM | POA: Diagnosis not present

## 2021-12-22 DIAGNOSIS — D631 Anemia in chronic kidney disease: Secondary | ICD-10-CM | POA: Diagnosis not present

## 2021-12-22 DIAGNOSIS — N186 End stage renal disease: Secondary | ICD-10-CM | POA: Diagnosis not present

## 2021-12-24 DIAGNOSIS — N186 End stage renal disease: Secondary | ICD-10-CM | POA: Diagnosis not present

## 2021-12-24 DIAGNOSIS — D631 Anemia in chronic kidney disease: Secondary | ICD-10-CM | POA: Diagnosis not present

## 2021-12-24 DIAGNOSIS — N2581 Secondary hyperparathyroidism of renal origin: Secondary | ICD-10-CM | POA: Diagnosis not present

## 2021-12-24 DIAGNOSIS — D509 Iron deficiency anemia, unspecified: Secondary | ICD-10-CM | POA: Diagnosis not present

## 2021-12-24 DIAGNOSIS — Z992 Dependence on renal dialysis: Secondary | ICD-10-CM | POA: Diagnosis not present

## 2021-12-25 ENCOUNTER — Telehealth: Payer: Self-pay

## 2021-12-25 DIAGNOSIS — I739 Peripheral vascular disease, unspecified: Secondary | ICD-10-CM

## 2021-12-25 NOTE — Telephone Encounter (Signed)
Pt called c/o leg, foot, and toe pain.  Reviewed pt's chart, returned call for clarification, two identifiers used. Pt stated that she has noticed her pain is worsening. She admits that the pain seems to be worse at HS. She is concerned about taking Gabapentin. Informed her that she should try to take it, even if it's at HS to evaluate it's efficacy for the pain. Pt wants to try to take as few medications as possible. Appts for [place] and MD scheduled. Confirmed understanding.

## 2021-12-26 DIAGNOSIS — D509 Iron deficiency anemia, unspecified: Secondary | ICD-10-CM | POA: Diagnosis not present

## 2021-12-26 DIAGNOSIS — N186 End stage renal disease: Secondary | ICD-10-CM | POA: Diagnosis not present

## 2021-12-26 DIAGNOSIS — Z992 Dependence on renal dialysis: Secondary | ICD-10-CM | POA: Diagnosis not present

## 2021-12-26 DIAGNOSIS — D631 Anemia in chronic kidney disease: Secondary | ICD-10-CM | POA: Diagnosis not present

## 2021-12-26 DIAGNOSIS — N2581 Secondary hyperparathyroidism of renal origin: Secondary | ICD-10-CM | POA: Diagnosis not present

## 2021-12-29 DIAGNOSIS — D631 Anemia in chronic kidney disease: Secondary | ICD-10-CM | POA: Diagnosis not present

## 2021-12-29 DIAGNOSIS — Z992 Dependence on renal dialysis: Secondary | ICD-10-CM | POA: Diagnosis not present

## 2021-12-29 DIAGNOSIS — N2581 Secondary hyperparathyroidism of renal origin: Secondary | ICD-10-CM | POA: Diagnosis not present

## 2021-12-29 DIAGNOSIS — N186 End stage renal disease: Secondary | ICD-10-CM | POA: Diagnosis not present

## 2021-12-29 DIAGNOSIS — D509 Iron deficiency anemia, unspecified: Secondary | ICD-10-CM | POA: Diagnosis not present

## 2021-12-31 DIAGNOSIS — N186 End stage renal disease: Secondary | ICD-10-CM | POA: Diagnosis not present

## 2021-12-31 DIAGNOSIS — Z992 Dependence on renal dialysis: Secondary | ICD-10-CM | POA: Diagnosis not present

## 2021-12-31 DIAGNOSIS — D631 Anemia in chronic kidney disease: Secondary | ICD-10-CM | POA: Diagnosis not present

## 2021-12-31 DIAGNOSIS — N2581 Secondary hyperparathyroidism of renal origin: Secondary | ICD-10-CM | POA: Diagnosis not present

## 2021-12-31 DIAGNOSIS — D509 Iron deficiency anemia, unspecified: Secondary | ICD-10-CM | POA: Diagnosis not present

## 2022-01-01 DIAGNOSIS — Z992 Dependence on renal dialysis: Secondary | ICD-10-CM | POA: Diagnosis not present

## 2022-01-01 DIAGNOSIS — N186 End stage renal disease: Secondary | ICD-10-CM | POA: Diagnosis not present

## 2022-01-01 DIAGNOSIS — I471 Supraventricular tachycardia, unspecified: Secondary | ICD-10-CM | POA: Diagnosis not present

## 2022-01-01 DIAGNOSIS — I4719 Other supraventricular tachycardia: Secondary | ICD-10-CM | POA: Diagnosis not present

## 2022-01-01 DIAGNOSIS — I272 Pulmonary hypertension, unspecified: Secondary | ICD-10-CM | POA: Diagnosis not present

## 2022-01-02 DIAGNOSIS — N186 End stage renal disease: Secondary | ICD-10-CM | POA: Diagnosis not present

## 2022-01-02 DIAGNOSIS — D509 Iron deficiency anemia, unspecified: Secondary | ICD-10-CM | POA: Diagnosis not present

## 2022-01-02 DIAGNOSIS — N2581 Secondary hyperparathyroidism of renal origin: Secondary | ICD-10-CM | POA: Diagnosis not present

## 2022-01-02 DIAGNOSIS — D631 Anemia in chronic kidney disease: Secondary | ICD-10-CM | POA: Diagnosis not present

## 2022-01-02 DIAGNOSIS — Z992 Dependence on renal dialysis: Secondary | ICD-10-CM | POA: Diagnosis not present

## 2022-01-05 DIAGNOSIS — Z992 Dependence on renal dialysis: Secondary | ICD-10-CM | POA: Diagnosis not present

## 2022-01-05 DIAGNOSIS — N2581 Secondary hyperparathyroidism of renal origin: Secondary | ICD-10-CM | POA: Diagnosis not present

## 2022-01-05 DIAGNOSIS — D631 Anemia in chronic kidney disease: Secondary | ICD-10-CM | POA: Diagnosis not present

## 2022-01-05 DIAGNOSIS — D509 Iron deficiency anemia, unspecified: Secondary | ICD-10-CM | POA: Diagnosis not present

## 2022-01-05 DIAGNOSIS — N186 End stage renal disease: Secondary | ICD-10-CM | POA: Diagnosis not present

## 2022-01-07 DIAGNOSIS — Z992 Dependence on renal dialysis: Secondary | ICD-10-CM | POA: Diagnosis not present

## 2022-01-07 DIAGNOSIS — N2581 Secondary hyperparathyroidism of renal origin: Secondary | ICD-10-CM | POA: Diagnosis not present

## 2022-01-07 DIAGNOSIS — D631 Anemia in chronic kidney disease: Secondary | ICD-10-CM | POA: Diagnosis not present

## 2022-01-07 DIAGNOSIS — D509 Iron deficiency anemia, unspecified: Secondary | ICD-10-CM | POA: Diagnosis not present

## 2022-01-07 DIAGNOSIS — N186 End stage renal disease: Secondary | ICD-10-CM | POA: Diagnosis not present

## 2022-01-09 DIAGNOSIS — Z992 Dependence on renal dialysis: Secondary | ICD-10-CM | POA: Diagnosis not present

## 2022-01-09 DIAGNOSIS — N186 End stage renal disease: Secondary | ICD-10-CM | POA: Diagnosis not present

## 2022-01-09 DIAGNOSIS — D509 Iron deficiency anemia, unspecified: Secondary | ICD-10-CM | POA: Diagnosis not present

## 2022-01-09 DIAGNOSIS — D631 Anemia in chronic kidney disease: Secondary | ICD-10-CM | POA: Diagnosis not present

## 2022-01-09 DIAGNOSIS — N2581 Secondary hyperparathyroidism of renal origin: Secondary | ICD-10-CM | POA: Diagnosis not present

## 2022-01-10 DIAGNOSIS — N269 Renal sclerosis, unspecified: Secondary | ICD-10-CM | POA: Diagnosis not present

## 2022-01-10 DIAGNOSIS — Z992 Dependence on renal dialysis: Secondary | ICD-10-CM | POA: Diagnosis not present

## 2022-01-10 DIAGNOSIS — N186 End stage renal disease: Secondary | ICD-10-CM | POA: Diagnosis not present

## 2022-01-12 ENCOUNTER — Inpatient Hospital Stay: Admission: RE | Admit: 2022-01-12 | Payer: Medicare Other | Source: Ambulatory Visit

## 2022-01-12 ENCOUNTER — Other Ambulatory Visit: Payer: Medicare Other

## 2022-01-12 DIAGNOSIS — N2581 Secondary hyperparathyroidism of renal origin: Secondary | ICD-10-CM | POA: Diagnosis not present

## 2022-01-12 DIAGNOSIS — E785 Hyperlipidemia, unspecified: Secondary | ICD-10-CM | POA: Diagnosis not present

## 2022-01-12 DIAGNOSIS — N186 End stage renal disease: Secondary | ICD-10-CM | POA: Diagnosis not present

## 2022-01-12 DIAGNOSIS — D631 Anemia in chronic kidney disease: Secondary | ICD-10-CM | POA: Diagnosis not present

## 2022-01-12 DIAGNOSIS — D509 Iron deficiency anemia, unspecified: Secondary | ICD-10-CM | POA: Diagnosis not present

## 2022-01-12 DIAGNOSIS — Z992 Dependence on renal dialysis: Secondary | ICD-10-CM | POA: Diagnosis not present

## 2022-01-13 ENCOUNTER — Ambulatory Visit
Admission: RE | Admit: 2022-01-13 | Discharge: 2022-01-13 | Disposition: A | Discharge status: Home / Self Care | Payer: Medicare Other | Source: Ambulatory Visit | Attending: Family Medicine | Admitting: Family Medicine

## 2022-01-13 DIAGNOSIS — M47812 Spondylosis without myelopathy or radiculopathy, cervical region: Secondary | ICD-10-CM | POA: Diagnosis not present

## 2022-01-13 DIAGNOSIS — L723 Sebaceous cyst: Secondary | ICD-10-CM | POA: Diagnosis not present

## 2022-01-13 DIAGNOSIS — R221 Localized swelling, mass and lump, neck: Secondary | ICD-10-CM

## 2022-01-13 DIAGNOSIS — C8590 Non-Hodgkin lymphoma, unspecified, unspecified site: Secondary | ICD-10-CM | POA: Diagnosis not present

## 2022-01-13 DIAGNOSIS — K116 Mucocele of salivary gland: Secondary | ICD-10-CM | POA: Diagnosis not present

## 2022-01-13 MED ORDER — IOPAMIDOL (ISOVUE-300) INJECTION 61%
75.0000 mL | Freq: Once | INTRAVENOUS | Status: AC | PRN
  Administered 2022-01-13: 75 mL via INTRAVENOUS

## 2022-01-14 DIAGNOSIS — N2581 Secondary hyperparathyroidism of renal origin: Secondary | ICD-10-CM | POA: Diagnosis not present

## 2022-01-14 DIAGNOSIS — Z992 Dependence on renal dialysis: Secondary | ICD-10-CM | POA: Diagnosis not present

## 2022-01-14 DIAGNOSIS — N186 End stage renal disease: Secondary | ICD-10-CM | POA: Diagnosis not present

## 2022-01-14 DIAGNOSIS — D509 Iron deficiency anemia, unspecified: Secondary | ICD-10-CM | POA: Diagnosis not present

## 2022-01-14 DIAGNOSIS — D631 Anemia in chronic kidney disease: Secondary | ICD-10-CM | POA: Diagnosis not present

## 2022-01-14 DIAGNOSIS — E785 Hyperlipidemia, unspecified: Secondary | ICD-10-CM | POA: Diagnosis not present

## 2022-01-15 DIAGNOSIS — K118 Other diseases of salivary glands: Secondary | ICD-10-CM | POA: Diagnosis not present

## 2022-01-15 DIAGNOSIS — K219 Gastro-esophageal reflux disease without esophagitis: Secondary | ICD-10-CM | POA: Diagnosis not present

## 2022-01-16 DIAGNOSIS — D509 Iron deficiency anemia, unspecified: Secondary | ICD-10-CM | POA: Diagnosis not present

## 2022-01-16 DIAGNOSIS — N186 End stage renal disease: Secondary | ICD-10-CM | POA: Diagnosis not present

## 2022-01-16 DIAGNOSIS — N2581 Secondary hyperparathyroidism of renal origin: Secondary | ICD-10-CM | POA: Diagnosis not present

## 2022-01-16 DIAGNOSIS — D631 Anemia in chronic kidney disease: Secondary | ICD-10-CM | POA: Diagnosis not present

## 2022-01-16 DIAGNOSIS — E785 Hyperlipidemia, unspecified: Secondary | ICD-10-CM | POA: Diagnosis not present

## 2022-01-16 DIAGNOSIS — Z992 Dependence on renal dialysis: Secondary | ICD-10-CM | POA: Diagnosis not present

## 2022-01-18 NOTE — Progress Notes (Unsigned)
[facility] [place]  Patient name: Katie Nunez MRN: [id number] DOB: 02/05/1955 Sex: female  REASON FOR VISIT: Evaluation lower extremity pain  HPI: Katie Nunez is a 67 y.o. female known to our service from prior AV access placement.  She currently has dialysis via a left upper arm fistula and has had excellent use of this. She does have multiple diagnoses including fibromyalgia scleroderma and Raynaud's disease.  Patient reports left lower extremity pain especially when changing positions (e.g. laying down at night to go to bed, or changing positions from seated to standing).  The patient does not have typical symptoms of claudication, ischemic rest pain, or ischemic ulceration.  She suffered a stroke recently and has been limited in her ambulation.  She is still active and able to walk.  05/14/21: Patient returns to clinic to discuss persistent complaints.  Patient reports cramping discomfort in her calves.  She mostly notices this when she is changing positions.  She continues to deny typical symptoms of peripheral arterial disease including cramping pain in the calves with walking; constant burning discomfort in the balls of the feet; or ischemic ulceration.  She also describes mild access related hand ischemia symptoms.  She notices discomfort in her left hand while doing dialysis treatments.  This does not bother her much at other times.  Past Medical History:  Diagnosis Date   Achalasia    Anxiety    Dysplasia of cervix, low grade (CIN 1)    Environmental allergies    "all year long" (12/27/2016)   ESRD (end stage renal disease) on dialysis ([place])    "TTS; [place]" (12/27/2016)   Fibromyalgia    GERD (gastroesophageal reflux disease)    Gout    Hypertension    IBS (irritable bowel syndrome)    MVP (mitral valve prolapse)    RA (rheumatoid arthritis) ([place])    FOLLOWED BY DR. SHANAHAN   Raynaud's disease     Scleroderma ([place])    Seasonal allergies    Thrombocytopenia ([place]) 07/01/2016   Acute fall to 13,000 07/01/16   Tubular adenoma 01/08/2008   CECUM   Vitamin D deficiency     Family History  Problem Relation Age of Onset   Hypertension Mother    Diabetes Mother    Heart disease Father    Hypertension Maternal Aunt    Diabetes Maternal Grandmother    Heart disease Paternal Grandfather    Cerebral palsy Cousin        1ST COUSIN?   Diabetes Paternal Grandmother     SOCIAL HISTORY: Social History   Tobacco Use   Smoking status: Never   Smokeless tobacco: Never  Substance Use Topics   Alcohol use: No    Allergies  Allergen Reactions   Other Anaphylaxis and Other (See Comments)    Do not use polyflux membrane.  Use alternate Other reaction(s): heart racing   Savella [Milnacipran Hcl] Palpitations and Other (See Comments)    Unknown   Tape Rash and Other (See Comments)    Itch- unsure if it was paper or adhesive tape    Current Outpatient Medications  Medication Sig Dispense Refill   albuterol (VENTOLIN HFA) 108 (90 Base) MCG/ACT inhaler Inhale 2 puffs into the lungs every 6 (six) hours as needed for wheezing or shortness of breath. 8 g 6   ambrisentan (LETAIRIS) 5 MG tablet Take 10 mg by mouth daily.     aspirin EC 81 MG tablet Take 1 tablet (  81 mg total) by mouth daily. Swallow whole. 30 tablet 11   atorvastatin (LIPITOR) 80 MG tablet Take 1 tablet (80 mg total) by mouth daily. 90 tablet 3   azelastine (ASTELIN) 0.1 % nasal spray Place 1 spray into both nostrils 2 (two) times daily as needed for rhinitis. Use in each nostril as directed 30 mL 12   calcium acetate (PHOSLO) 667 MG capsule Take by mouth.     camphor-menthol (SARNA) lotion Apply topically 2 (two) times daily. (Patient taking differently: Apply topically as needed.) 222 mL 0   clonazePAM (KLONOPIN) 0.5 MG tablet Take 1 tablet (0.5 mg total) by mouth 2 (two) times daily as needed for anxiety. 30 tablet 0    co-enzyme Q-10 30 MG capsule Take 7 capsules (210 mg total) by mouth 3 (three) times daily. 1 capsule 1   cyclobenzaprine (FLEXERIL) 5 MG tablet Take 1 tablet (5 mg total) by mouth every 8 (eight) hours as needed for muscle spasms. 30 tablet 0   docusate sodium (COLACE) 100 MG capsule Take 2 capsules (200 mg total) by mouth daily. (Patient taking differently: Take 200 mg by mouth as needed.) 60 capsule 0   doxycycline (VIBRA-TABS) 100 MG tablet Take 100 mg by mouth 2 (two) times daily.     famotidine (PEPCID) 20 MG tablet Take 1 tablet (20 mg total) by mouth daily as needed for heartburn or indigestion. (Patient taking differently: Take 40 mg by mouth 2 (two) times daily.) 30 tablet 0   gabapentin (NEURONTIN) 100 MG capsule Take 1 capsule (100 mg total) by mouth 3 (three) times daily. Take 1 capsule in morning and two at night 90 capsule 0   hydrocerin (EUCERIN) CREA Apply 1 application topically 2 (two) times daily. To dry skin--avoid IV site 454 g 0   hydrOXYzine (ATARAX/VISTARIL) 25 MG tablet Take 25 mg by mouth every 8 (eight) hours as needed for itching.      levocetirizine (XYZAL) 5 MG tablet SMARTSIG:1 Tablet(s) By Mouth Every Evening     lidocaine-prilocaine (EMLA) cream Apply 1 application topically as needed. Apply to toes as needed for pain 30 g 3   lubiprostone (AMITIZA) 8 MCG capsule Take 8 mcg by mouth 2 (two) times daily with a meal.     multivitamin (RENA-VIT) TABS tablet Take 1 tablet by mouth daily at 6 (six) AM. 30 tablet 0   nitroGLYCERIN (NITRO-DUR) 0.2 mg/hr patch Place 1 patch (0.2 mg total) onto the skin daily. Apply near great toes bilateral to top of foot every morning.  May remove at night 30 patch 4   oxymetazoline (AFRIN) 0.05 % nasal spray Place 1 spray into both nostrils 2 (two) times daily as needed for congestion. 30 mL 0   Selexipag (UPTRAVI) 800 MCG TABS Take 1 tablet (800 mcg total) by mouth in the morning and at bedtime. 60 tablet 0   sodium chloride (OCEAN) 0.65 %  SOLN nasal spray Place 1 spray into both nostrils 5 (five) times daily. At least 5 times a day  0   No current facility-administered medications for this visit.    REVIEW OF SYSTEMS:  '[X]'$  denotes positive finding, '[ ]'$  denotes negative finding Cardiac  Comments:  Chest pain or chest pressure:    Shortness of breath upon exertion:    Short of breath when lying flat:    Irregular heart rhythm:        Vascular    Pain in calf, thigh, or hip brought on by ambulation:  Pain in feet at night that wakes you up from your sleep:     Blood clot in your veins:    Leg swelling:  x         PHYSICAL EXAM: There were no vitals filed for this visit.    GENERAL: The patient is a well-nourished female, in no acute distress. The vital signs are documented above. CARDIOVASCULAR: Palpable radial pulses bilaterally.  Excellent thrill in her left upper arm AV fistula.  1+ PT pulse bilaterally. PULMONARY: There is good air exchange  MUSCULOSKELETAL: There are no major deformities or cyanosis. NEUROLOGIC: No focal weakness or paresthesias are detected. SKIN: There are no ulcers or rashes noted.  Thickening and leathery changes from her calves down onto her feet and toes PSYCHIATRIC: The patient has a normal affect.  DATA:  +-------+-----------+-----------+------------+------------+  ABI/TBIToday's ABIToday's TBIPrevious ABIPrevious TBI  +-------+-----------+-----------+------------+------------+  Right  1.12       absent     1.15        0.63          +-------+-----------+-----------+------------+------------+  Left   1.28       absent     1.17        0.65          +-------+-----------+-----------+------------+------------+    MEDICAL ISSUES: 67 year old woman with end-stage renal disease and lower extremity pain of unclear etiology.  No evidence of hemodynamically significant peripheral arterial disease on clinical exam or noninvasive testing today.  She does likely have  small artery disease in her feet based on her absent toe pressures.  Thankfully she does not have any limb threatening features.  I counseled conservative management only for the access related hand ischemia.  I will see her again in a year with a repeat ABI.    Yevonne Aline. Stanford Breed, MD [facility] Phone Number: [phone] 01/18/2022 5:44 PM

## 2022-01-19 ENCOUNTER — Encounter: Payer: Self-pay | Admitting: Vascular Surgery

## 2022-01-19 ENCOUNTER — Ambulatory Visit (HOSPITAL_COMMUNITY)
Admission: RE | Admit: 2022-01-19 | Discharge: 2022-01-19 | Disposition: A | Discharge status: Home / Self Care | Payer: Medicare Other | Source: Ambulatory Visit | Attending: Vascular Surgery | Admitting: Vascular Surgery

## 2022-01-19 ENCOUNTER — Ambulatory Visit: Payer: Medicare Other | Admitting: Vascular Surgery

## 2022-01-19 VITALS — BP 102/61 | HR 98 | Temp 98.0°F | Resp 20 | Ht 65.0 in | Wt 131.0 lb

## 2022-01-19 DIAGNOSIS — E785 Hyperlipidemia, unspecified: Secondary | ICD-10-CM | POA: Diagnosis not present

## 2022-01-19 DIAGNOSIS — R0609 Other forms of dyspnea: Secondary | ICD-10-CM | POA: Diagnosis not present

## 2022-01-19 DIAGNOSIS — D631 Anemia in chronic kidney disease: Secondary | ICD-10-CM | POA: Diagnosis not present

## 2022-01-19 DIAGNOSIS — I739 Peripheral vascular disease, unspecified: Secondary | ICD-10-CM | POA: Insufficient documentation

## 2022-01-19 DIAGNOSIS — M79605 Pain in left leg: Secondary | ICD-10-CM

## 2022-01-19 DIAGNOSIS — Z992 Dependence on renal dialysis: Secondary | ICD-10-CM

## 2022-01-19 DIAGNOSIS — N186 End stage renal disease: Secondary | ICD-10-CM | POA: Diagnosis not present

## 2022-01-19 DIAGNOSIS — I270 Primary pulmonary hypertension: Secondary | ICD-10-CM | POA: Diagnosis not present

## 2022-01-19 DIAGNOSIS — M79604 Pain in right leg: Secondary | ICD-10-CM

## 2022-01-19 DIAGNOSIS — N2581 Secondary hyperparathyroidism of renal origin: Secondary | ICD-10-CM | POA: Diagnosis not present

## 2022-01-19 DIAGNOSIS — D509 Iron deficiency anemia, unspecified: Secondary | ICD-10-CM | POA: Diagnosis not present

## 2022-01-20 LAB — VAS US ABI WITH/WO TBI
Left ABI: 1.04
Right ABI: 1.06

## 2022-01-21 DIAGNOSIS — D631 Anemia in chronic kidney disease: Secondary | ICD-10-CM | POA: Diagnosis not present

## 2022-01-21 DIAGNOSIS — N2581 Secondary hyperparathyroidism of renal origin: Secondary | ICD-10-CM | POA: Diagnosis not present

## 2022-01-21 DIAGNOSIS — Z992 Dependence on renal dialysis: Secondary | ICD-10-CM | POA: Diagnosis not present

## 2022-01-21 DIAGNOSIS — D509 Iron deficiency anemia, unspecified: Secondary | ICD-10-CM | POA: Diagnosis not present

## 2022-01-21 DIAGNOSIS — N186 End stage renal disease: Secondary | ICD-10-CM | POA: Diagnosis not present

## 2022-01-21 DIAGNOSIS — E785 Hyperlipidemia, unspecified: Secondary | ICD-10-CM | POA: Diagnosis not present

## 2022-01-23 DIAGNOSIS — Z992 Dependence on renal dialysis: Secondary | ICD-10-CM | POA: Diagnosis not present

## 2022-01-23 DIAGNOSIS — D509 Iron deficiency anemia, unspecified: Secondary | ICD-10-CM | POA: Diagnosis not present

## 2022-01-23 DIAGNOSIS — N186 End stage renal disease: Secondary | ICD-10-CM | POA: Diagnosis not present

## 2022-01-23 DIAGNOSIS — E785 Hyperlipidemia, unspecified: Secondary | ICD-10-CM | POA: Diagnosis not present

## 2022-01-23 DIAGNOSIS — N2581 Secondary hyperparathyroidism of renal origin: Secondary | ICD-10-CM | POA: Diagnosis not present

## 2022-01-23 DIAGNOSIS — D631 Anemia in chronic kidney disease: Secondary | ICD-10-CM | POA: Diagnosis not present

## 2022-01-26 DIAGNOSIS — N186 End stage renal disease: Secondary | ICD-10-CM | POA: Diagnosis not present

## 2022-01-26 DIAGNOSIS — D631 Anemia in chronic kidney disease: Secondary | ICD-10-CM | POA: Diagnosis not present

## 2022-01-26 DIAGNOSIS — Z992 Dependence on renal dialysis: Secondary | ICD-10-CM | POA: Diagnosis not present

## 2022-01-26 DIAGNOSIS — D509 Iron deficiency anemia, unspecified: Secondary | ICD-10-CM | POA: Diagnosis not present

## 2022-01-26 DIAGNOSIS — E785 Hyperlipidemia, unspecified: Secondary | ICD-10-CM | POA: Diagnosis not present

## 2022-01-26 DIAGNOSIS — N2581 Secondary hyperparathyroidism of renal origin: Secondary | ICD-10-CM | POA: Diagnosis not present

## 2022-01-28 DIAGNOSIS — N2581 Secondary hyperparathyroidism of renal origin: Secondary | ICD-10-CM | POA: Diagnosis not present

## 2022-01-28 DIAGNOSIS — N186 End stage renal disease: Secondary | ICD-10-CM | POA: Diagnosis not present

## 2022-01-28 DIAGNOSIS — D509 Iron deficiency anemia, unspecified: Secondary | ICD-10-CM | POA: Diagnosis not present

## 2022-01-28 DIAGNOSIS — Z992 Dependence on renal dialysis: Secondary | ICD-10-CM | POA: Diagnosis not present

## 2022-01-28 DIAGNOSIS — D631 Anemia in chronic kidney disease: Secondary | ICD-10-CM | POA: Diagnosis not present

## 2022-01-28 DIAGNOSIS — E785 Hyperlipidemia, unspecified: Secondary | ICD-10-CM | POA: Diagnosis not present

## 2022-01-30 DIAGNOSIS — Z992 Dependence on renal dialysis: Secondary | ICD-10-CM | POA: Diagnosis not present

## 2022-01-30 DIAGNOSIS — D509 Iron deficiency anemia, unspecified: Secondary | ICD-10-CM | POA: Diagnosis not present

## 2022-01-30 DIAGNOSIS — N2581 Secondary hyperparathyroidism of renal origin: Secondary | ICD-10-CM | POA: Diagnosis not present

## 2022-01-30 DIAGNOSIS — D631 Anemia in chronic kidney disease: Secondary | ICD-10-CM | POA: Diagnosis not present

## 2022-01-30 DIAGNOSIS — E785 Hyperlipidemia, unspecified: Secondary | ICD-10-CM | POA: Diagnosis not present

## 2022-01-30 DIAGNOSIS — N186 End stage renal disease: Secondary | ICD-10-CM | POA: Diagnosis not present

## 2022-02-02 DIAGNOSIS — D509 Iron deficiency anemia, unspecified: Secondary | ICD-10-CM | POA: Diagnosis not present

## 2022-02-02 DIAGNOSIS — N2581 Secondary hyperparathyroidism of renal origin: Secondary | ICD-10-CM | POA: Diagnosis not present

## 2022-02-02 DIAGNOSIS — D631 Anemia in chronic kidney disease: Secondary | ICD-10-CM | POA: Diagnosis not present

## 2022-02-02 DIAGNOSIS — Z992 Dependence on renal dialysis: Secondary | ICD-10-CM | POA: Diagnosis not present

## 2022-02-02 DIAGNOSIS — N186 End stage renal disease: Secondary | ICD-10-CM | POA: Diagnosis not present

## 2022-02-02 DIAGNOSIS — E785 Hyperlipidemia, unspecified: Secondary | ICD-10-CM | POA: Diagnosis not present

## 2022-02-03 DIAGNOSIS — K7460 Unspecified cirrhosis of liver: Secondary | ICD-10-CM | POA: Diagnosis not present

## 2022-02-04 DIAGNOSIS — N186 End stage renal disease: Secondary | ICD-10-CM | POA: Diagnosis not present

## 2022-02-04 DIAGNOSIS — E785 Hyperlipidemia, unspecified: Secondary | ICD-10-CM | POA: Diagnosis not present

## 2022-02-04 DIAGNOSIS — D509 Iron deficiency anemia, unspecified: Secondary | ICD-10-CM | POA: Diagnosis not present

## 2022-02-04 DIAGNOSIS — N2581 Secondary hyperparathyroidism of renal origin: Secondary | ICD-10-CM | POA: Diagnosis not present

## 2022-02-04 DIAGNOSIS — Z992 Dependence on renal dialysis: Secondary | ICD-10-CM | POA: Diagnosis not present

## 2022-02-04 DIAGNOSIS — D631 Anemia in chronic kidney disease: Secondary | ICD-10-CM | POA: Diagnosis not present

## 2022-02-06 DIAGNOSIS — E785 Hyperlipidemia, unspecified: Secondary | ICD-10-CM | POA: Diagnosis not present

## 2022-02-06 DIAGNOSIS — Z992 Dependence on renal dialysis: Secondary | ICD-10-CM | POA: Diagnosis not present

## 2022-02-06 DIAGNOSIS — D509 Iron deficiency anemia, unspecified: Secondary | ICD-10-CM | POA: Diagnosis not present

## 2022-02-06 DIAGNOSIS — D631 Anemia in chronic kidney disease: Secondary | ICD-10-CM | POA: Diagnosis not present

## 2022-02-06 DIAGNOSIS — N186 End stage renal disease: Secondary | ICD-10-CM | POA: Diagnosis not present

## 2022-02-06 DIAGNOSIS — N2581 Secondary hyperparathyroidism of renal origin: Secondary | ICD-10-CM | POA: Diagnosis not present

## 2022-02-09 DIAGNOSIS — D631 Anemia in chronic kidney disease: Secondary | ICD-10-CM | POA: Diagnosis not present

## 2022-02-09 DIAGNOSIS — D509 Iron deficiency anemia, unspecified: Secondary | ICD-10-CM | POA: Diagnosis not present

## 2022-02-09 DIAGNOSIS — N186 End stage renal disease: Secondary | ICD-10-CM | POA: Diagnosis not present

## 2022-02-09 DIAGNOSIS — N2581 Secondary hyperparathyroidism of renal origin: Secondary | ICD-10-CM | POA: Diagnosis not present

## 2022-02-09 DIAGNOSIS — Z992 Dependence on renal dialysis: Secondary | ICD-10-CM | POA: Diagnosis not present

## 2022-02-09 DIAGNOSIS — E785 Hyperlipidemia, unspecified: Secondary | ICD-10-CM | POA: Diagnosis not present

## 2022-02-10 DIAGNOSIS — N186 End stage renal disease: Secondary | ICD-10-CM | POA: Diagnosis not present

## 2022-02-10 DIAGNOSIS — Z992 Dependence on renal dialysis: Secondary | ICD-10-CM | POA: Diagnosis not present

## 2022-02-10 DIAGNOSIS — N269 Renal sclerosis, unspecified: Secondary | ICD-10-CM | POA: Diagnosis not present

## 2022-02-11 DIAGNOSIS — D631 Anemia in chronic kidney disease: Secondary | ICD-10-CM | POA: Diagnosis not present

## 2022-02-11 DIAGNOSIS — N186 End stage renal disease: Secondary | ICD-10-CM | POA: Diagnosis not present

## 2022-02-11 DIAGNOSIS — Z992 Dependence on renal dialysis: Secondary | ICD-10-CM | POA: Diagnosis not present

## 2022-02-11 DIAGNOSIS — N2581 Secondary hyperparathyroidism of renal origin: Secondary | ICD-10-CM | POA: Diagnosis not present

## 2022-02-11 DIAGNOSIS — D509 Iron deficiency anemia, unspecified: Secondary | ICD-10-CM | POA: Diagnosis not present

## 2022-02-12 DIAGNOSIS — K118 Other diseases of salivary glands: Secondary | ICD-10-CM | POA: Diagnosis not present

## 2022-02-12 DIAGNOSIS — K116 Mucocele of salivary gland: Secondary | ICD-10-CM | POA: Diagnosis not present

## 2022-02-13 DIAGNOSIS — D509 Iron deficiency anemia, unspecified: Secondary | ICD-10-CM | POA: Diagnosis not present

## 2022-02-13 DIAGNOSIS — N2581 Secondary hyperparathyroidism of renal origin: Secondary | ICD-10-CM | POA: Diagnosis not present

## 2022-02-13 DIAGNOSIS — D631 Anemia in chronic kidney disease: Secondary | ICD-10-CM | POA: Diagnosis not present

## 2022-02-13 DIAGNOSIS — N186 End stage renal disease: Secondary | ICD-10-CM | POA: Diagnosis not present

## 2022-02-13 DIAGNOSIS — Z992 Dependence on renal dialysis: Secondary | ICD-10-CM | POA: Diagnosis not present

## 2022-02-16 DIAGNOSIS — D631 Anemia in chronic kidney disease: Secondary | ICD-10-CM | POA: Diagnosis not present

## 2022-02-16 DIAGNOSIS — Z992 Dependence on renal dialysis: Secondary | ICD-10-CM | POA: Diagnosis not present

## 2022-02-16 DIAGNOSIS — N2581 Secondary hyperparathyroidism of renal origin: Secondary | ICD-10-CM | POA: Diagnosis not present

## 2022-02-16 DIAGNOSIS — N186 End stage renal disease: Secondary | ICD-10-CM | POA: Diagnosis not present

## 2022-02-16 DIAGNOSIS — D509 Iron deficiency anemia, unspecified: Secondary | ICD-10-CM | POA: Diagnosis not present

## 2022-02-18 DIAGNOSIS — N186 End stage renal disease: Secondary | ICD-10-CM | POA: Diagnosis not present

## 2022-02-18 DIAGNOSIS — M3489 Other systemic sclerosis: Secondary | ICD-10-CM | POA: Diagnosis not present

## 2022-02-18 DIAGNOSIS — E782 Mixed hyperlipidemia: Secondary | ICD-10-CM | POA: Diagnosis not present

## 2022-02-18 DIAGNOSIS — D631 Anemia in chronic kidney disease: Secondary | ICD-10-CM | POA: Diagnosis not present

## 2022-02-18 DIAGNOSIS — R200 Anesthesia of skin: Secondary | ICD-10-CM | POA: Diagnosis not present

## 2022-02-18 DIAGNOSIS — N2581 Secondary hyperparathyroidism of renal origin: Secondary | ICD-10-CM | POA: Diagnosis not present

## 2022-02-18 DIAGNOSIS — R202 Paresthesia of skin: Secondary | ICD-10-CM | POA: Diagnosis not present

## 2022-02-18 DIAGNOSIS — D509 Iron deficiency anemia, unspecified: Secondary | ICD-10-CM | POA: Diagnosis not present

## 2022-02-18 DIAGNOSIS — Z992 Dependence on renal dialysis: Secondary | ICD-10-CM | POA: Diagnosis not present

## 2022-02-19 DIAGNOSIS — N186 End stage renal disease: Secondary | ICD-10-CM | POA: Diagnosis not present

## 2022-02-19 DIAGNOSIS — I270 Primary pulmonary hypertension: Secondary | ICD-10-CM | POA: Diagnosis not present

## 2022-02-19 DIAGNOSIS — R0609 Other forms of dyspnea: Secondary | ICD-10-CM | POA: Diagnosis not present

## 2022-02-20 DIAGNOSIS — N186 End stage renal disease: Secondary | ICD-10-CM | POA: Diagnosis not present

## 2022-02-20 DIAGNOSIS — D631 Anemia in chronic kidney disease: Secondary | ICD-10-CM | POA: Diagnosis not present

## 2022-02-20 DIAGNOSIS — Z992 Dependence on renal dialysis: Secondary | ICD-10-CM | POA: Diagnosis not present

## 2022-02-20 DIAGNOSIS — N2581 Secondary hyperparathyroidism of renal origin: Secondary | ICD-10-CM | POA: Diagnosis not present

## 2022-02-20 DIAGNOSIS — D509 Iron deficiency anemia, unspecified: Secondary | ICD-10-CM | POA: Diagnosis not present

## 2022-02-22 ENCOUNTER — Telehealth: Payer: Self-pay | Admitting: Neurology

## 2022-02-22 NOTE — Telephone Encounter (Signed)
Patient requesting a provider switch from Dr. Leonie Man to Dr. Leta Baptist. Patient being referred to [place] for bilateral lower leg numbness, requesting NCV/EMG. Last saw Dr. Leonie Man 2023 hx strokes, leg cramps. Please advise if this is acceptable, thank you.

## 2022-02-23 DIAGNOSIS — N186 End stage renal disease: Secondary | ICD-10-CM | POA: Diagnosis not present

## 2022-02-23 DIAGNOSIS — N2581 Secondary hyperparathyroidism of renal origin: Secondary | ICD-10-CM | POA: Diagnosis not present

## 2022-02-23 DIAGNOSIS — D509 Iron deficiency anemia, unspecified: Secondary | ICD-10-CM | POA: Diagnosis not present

## 2022-02-23 DIAGNOSIS — Z992 Dependence on renal dialysis: Secondary | ICD-10-CM | POA: Diagnosis not present

## 2022-02-23 DIAGNOSIS — D631 Anemia in chronic kidney disease: Secondary | ICD-10-CM | POA: Diagnosis not present

## 2022-02-25 DIAGNOSIS — N186 End stage renal disease: Secondary | ICD-10-CM | POA: Diagnosis not present

## 2022-02-25 DIAGNOSIS — D631 Anemia in chronic kidney disease: Secondary | ICD-10-CM | POA: Diagnosis not present

## 2022-02-25 DIAGNOSIS — D509 Iron deficiency anemia, unspecified: Secondary | ICD-10-CM | POA: Diagnosis not present

## 2022-02-25 DIAGNOSIS — Z992 Dependence on renal dialysis: Secondary | ICD-10-CM | POA: Diagnosis not present

## 2022-02-25 DIAGNOSIS — N2581 Secondary hyperparathyroidism of renal origin: Secondary | ICD-10-CM | POA: Diagnosis not present

## 2022-02-27 DIAGNOSIS — D631 Anemia in chronic kidney disease: Secondary | ICD-10-CM | POA: Diagnosis not present

## 2022-02-27 DIAGNOSIS — N2581 Secondary hyperparathyroidism of renal origin: Secondary | ICD-10-CM | POA: Diagnosis not present

## 2022-02-27 DIAGNOSIS — N186 End stage renal disease: Secondary | ICD-10-CM | POA: Diagnosis not present

## 2022-02-27 DIAGNOSIS — Z992 Dependence on renal dialysis: Secondary | ICD-10-CM | POA: Diagnosis not present

## 2022-02-27 DIAGNOSIS — D509 Iron deficiency anemia, unspecified: Secondary | ICD-10-CM | POA: Diagnosis not present

## 2022-03-01 ENCOUNTER — Telehealth: Payer: Self-pay | Admitting: Emergency Medicine

## 2022-03-01 NOTE — Telephone Encounter (Signed)
Called and spoke with pt letting her know that we did need her to have the upcoming CT and she verbalized understanding. OV with RB has been scheduled for pt so that way the results could be discussed with pt. Nothing further needed.

## 2022-03-01 NOTE — Telephone Encounter (Signed)
Pt calling in bc she has a CT scan scheduled on 03/19/2022 and she had some scans done in between the last time shes seen Dr. Lamonte Sakai until now, she had one with and w/o contast. Right side of her neck has a nodule and also showed she has Sjogren's syndrome. Wants to know if she should still have Ct done per Dr. Lamonte Sakai or if we can use the CTs already done.

## 2022-03-02 DIAGNOSIS — N2581 Secondary hyperparathyroidism of renal origin: Secondary | ICD-10-CM | POA: Diagnosis not present

## 2022-03-02 DIAGNOSIS — D631 Anemia in chronic kidney disease: Secondary | ICD-10-CM | POA: Diagnosis not present

## 2022-03-02 DIAGNOSIS — N186 End stage renal disease: Secondary | ICD-10-CM | POA: Diagnosis not present

## 2022-03-02 DIAGNOSIS — Z992 Dependence on renal dialysis: Secondary | ICD-10-CM | POA: Diagnosis not present

## 2022-03-02 DIAGNOSIS — D509 Iron deficiency anemia, unspecified: Secondary | ICD-10-CM | POA: Diagnosis not present

## 2022-03-04 DIAGNOSIS — N186 End stage renal disease: Secondary | ICD-10-CM | POA: Diagnosis not present

## 2022-03-04 DIAGNOSIS — M349 Systemic sclerosis, unspecified: Secondary | ICD-10-CM | POA: Diagnosis not present

## 2022-03-04 DIAGNOSIS — R198 Other specified symptoms and signs involving the digestive system and abdomen: Secondary | ICD-10-CM | POA: Diagnosis not present

## 2022-03-04 DIAGNOSIS — N2581 Secondary hyperparathyroidism of renal origin: Secondary | ICD-10-CM | POA: Diagnosis not present

## 2022-03-04 DIAGNOSIS — D509 Iron deficiency anemia, unspecified: Secondary | ICD-10-CM | POA: Diagnosis not present

## 2022-03-04 DIAGNOSIS — D631 Anemia in chronic kidney disease: Secondary | ICD-10-CM | POA: Diagnosis not present

## 2022-03-04 DIAGNOSIS — Z992 Dependence on renal dialysis: Secondary | ICD-10-CM | POA: Diagnosis not present

## 2022-03-06 DIAGNOSIS — N186 End stage renal disease: Secondary | ICD-10-CM | POA: Diagnosis not present

## 2022-03-06 DIAGNOSIS — D509 Iron deficiency anemia, unspecified: Secondary | ICD-10-CM | POA: Diagnosis not present

## 2022-03-06 DIAGNOSIS — Z992 Dependence on renal dialysis: Secondary | ICD-10-CM | POA: Diagnosis not present

## 2022-03-06 DIAGNOSIS — N2581 Secondary hyperparathyroidism of renal origin: Secondary | ICD-10-CM | POA: Diagnosis not present

## 2022-03-06 DIAGNOSIS — D631 Anemia in chronic kidney disease: Secondary | ICD-10-CM | POA: Diagnosis not present

## 2022-03-09 DIAGNOSIS — D509 Iron deficiency anemia, unspecified: Secondary | ICD-10-CM | POA: Diagnosis not present

## 2022-03-09 DIAGNOSIS — Z992 Dependence on renal dialysis: Secondary | ICD-10-CM | POA: Diagnosis not present

## 2022-03-09 DIAGNOSIS — D631 Anemia in chronic kidney disease: Secondary | ICD-10-CM | POA: Diagnosis not present

## 2022-03-09 DIAGNOSIS — N186 End stage renal disease: Secondary | ICD-10-CM | POA: Diagnosis not present

## 2022-03-09 DIAGNOSIS — N2581 Secondary hyperparathyroidism of renal origin: Secondary | ICD-10-CM | POA: Diagnosis not present

## 2022-03-10 DIAGNOSIS — G629 Polyneuropathy, unspecified: Secondary | ICD-10-CM | POA: Diagnosis not present

## 2022-03-11 DIAGNOSIS — N186 End stage renal disease: Secondary | ICD-10-CM | POA: Diagnosis not present

## 2022-03-11 DIAGNOSIS — N2581 Secondary hyperparathyroidism of renal origin: Secondary | ICD-10-CM | POA: Diagnosis not present

## 2022-03-11 DIAGNOSIS — N269 Renal sclerosis, unspecified: Secondary | ICD-10-CM | POA: Diagnosis not present

## 2022-03-11 DIAGNOSIS — D631 Anemia in chronic kidney disease: Secondary | ICD-10-CM | POA: Diagnosis not present

## 2022-03-11 DIAGNOSIS — Z992 Dependence on renal dialysis: Secondary | ICD-10-CM | POA: Diagnosis not present

## 2022-03-11 DIAGNOSIS — D509 Iron deficiency anemia, unspecified: Secondary | ICD-10-CM | POA: Diagnosis not present

## 2022-03-13 DIAGNOSIS — D631 Anemia in chronic kidney disease: Secondary | ICD-10-CM | POA: Diagnosis not present

## 2022-03-13 DIAGNOSIS — Z992 Dependence on renal dialysis: Secondary | ICD-10-CM | POA: Diagnosis not present

## 2022-03-13 DIAGNOSIS — N2581 Secondary hyperparathyroidism of renal origin: Secondary | ICD-10-CM | POA: Diagnosis not present

## 2022-03-13 DIAGNOSIS — D509 Iron deficiency anemia, unspecified: Secondary | ICD-10-CM | POA: Diagnosis not present

## 2022-03-13 DIAGNOSIS — N186 End stage renal disease: Secondary | ICD-10-CM | POA: Diagnosis not present

## 2022-03-15 ENCOUNTER — Ambulatory Visit: Payer: Medicare Other | Admitting: Podiatry

## 2022-03-15 ENCOUNTER — Ambulatory Visit (INDEPENDENT_AMBULATORY_CARE_PROVIDER_SITE_OTHER): Payer: Medicare Other

## 2022-03-15 VITALS — BP 102/65

## 2022-03-15 DIAGNOSIS — M778 Other enthesopathies, not elsewhere classified: Secondary | ICD-10-CM | POA: Diagnosis not present

## 2022-03-15 DIAGNOSIS — M79671 Pain in right foot: Secondary | ICD-10-CM

## 2022-03-15 DIAGNOSIS — M2012 Hallux valgus (acquired), left foot: Secondary | ICD-10-CM

## 2022-03-15 DIAGNOSIS — M79672 Pain in left foot: Secondary | ICD-10-CM

## 2022-03-15 MED ORDER — BETAMETHASONE SOD PHOS & ACET 6 (3-3) MG/ML IJ SUSP
3.0000 mg | Freq: Once | INTRAMUSCULAR | Status: AC
  Administered 2022-03-15: 3 mg via INTRA_ARTICULAR

## 2022-03-15 NOTE — Progress Notes (Signed)
Chief Complaint  Patient presents with   Foot Pain    Bilateral foot and ankle pain, numbness and burning, callus trim     Subjective: 67 y.o. female presenting to the office today for routine footcare and evaluation of bilateral foot pain.  Patient states that her feet are very tight and stiff and she experiences pain along the dorsum of the bilateral feet.  She recently went to vascular to have her circulation tested which apparently was sufficient.  She also has a symptomatic bunion to the left lower extremity with callus formations bilateral and she is requesting callus debridement today   Past Medical History:  Diagnosis Date   Achalasia    Anxiety    Dysplasia of cervix, low grade (CIN 1)    Environmental allergies    "all year long" (12/27/2016)   ESRD (end stage renal disease) on dialysis ([place])    "TTS; [place]" (12/27/2016)   Fibromyalgia    GERD (gastroesophageal reflux disease)    Gout    Hypertension    IBS (irritable bowel syndrome)    MVP (mitral valve prolapse)    RA (rheumatoid arthritis) ([place])    FOLLOWED BY DR. SHANAHAN   Raynaud's disease    Scleroderma ([place])    Seasonal allergies    Thrombocytopenia ([place]) 07/01/2016   Acute fall to 13,000 07/01/16   Tubular adenoma 01/08/2008   CECUM   Vitamin D deficiency     Past Surgical History:  Procedure Laterality Date   ANKLE FRACTURE SURGERY Right    AV FISTULA PLACEMENT Left 06/28/2016   Procedure: left arm ARTERIOVENOUS (AV) FISTULA CREATION;  Surgeon: Rosetta Posner, MD;  [facility];  Service: Vascular;  Laterality: Left;   [place] Left 09/27/2016   Procedure: LEFT UPPER ARM CEPHALIC VEIN TRANSPOSITION;  Surgeon: Rosetta Posner, MD;  Location: [place];  Service: Vascular;  Laterality: Left;   BREAST BIOPSY     "? side"   [place]     COLONOSCOPY W/ BIOPSIES  01/08/2008   INSERTION OF DIALYSIS CATHETER Right 06/28/2016   Procedure: INSERTION OF  DIALYSIS CATHETER, right internal jugular;  Surgeon: Rosetta Posner, MD;  Location: [place];  Service: Vascular;  Laterality: Right;   MYOMECTOMY     NASAL ENDOSCOPY WITH EPISTAXIS CONTROL N/A 12/29/2019   Procedure: NASAL ENDOSCOPY WITH EPISTAXIS CONTROL;  Surgeon: Leta Baptist, MD;  Location: [place];  Service: ENT;  Laterality: N/A;   PELVIC LAPAROSCOPY  2011   superficial thrombophlebitis Left 07-2014    Allergies  Allergen Reactions   Other Anaphylaxis and Other (See Comments)    Do not use polyflux membrane.  Use alternate Other reaction(s): heart racing   Savella [Milnacipran Hcl] Palpitations and Other (See Comments)    Unknown   Tape Rash and Other (See Comments)    Itch- unsure if it was paper or adhesive tape     Objective:  Physical Exam General: Alert and oriented x3 in no acute distress  Dermatology: Hyperkeratotic lesion(s) present on the bilateral feet. Pain on palpation with a central nucleated core noted. Skin is warm, dry and supple bilateral lower extremities. Negative for open lesions or macerations.  Vascular: Palpable pedal pulses bilaterally. No edema or erythema noted. Capillary refill within normal limits.  Neurological: Epicritic and protective threshold grossly intact bilaterally.   Musculoskeletal Exam: Hallux valgus noted left.  Limited range of motion with tenderness with palpation bilateral  feet  Radiographic exam B/L foot and ankle 03/15/2022: Normal osseous mineralization.  No acute fractures identified.  Ankle hardware noted which appears stable and intact right.  Hallux valgus noted left lower extremity  Assessment: 1.  Symptomatic callus; benign skin lesion 2.  Hallux valgus left 3.  Capsulitis bilateral feet   Plan of Care:  1. Patient evaluated 2. Excisional debridement of keratoic lesion(s) using a chisel blade was performed without incident.  3.  Injection of 0.5 cc Celestone Soluspan injected on the dorsum of the bilateral feet midtarsal  joint 4.  Recommend conservative treatment of the hallux valgus bunion deformity including wide shoes and arch supports.  Advised against going barefoot  5.  Patient is to return to the clinic PRN.   Edrick Kins, DPM [facility]  Dr. Edrick Kins, DPM    [address], [place] [address]                Office [phone]  Fax [phone]

## 2022-03-16 DIAGNOSIS — D509 Iron deficiency anemia, unspecified: Secondary | ICD-10-CM | POA: Diagnosis not present

## 2022-03-16 DIAGNOSIS — Z992 Dependence on renal dialysis: Secondary | ICD-10-CM | POA: Diagnosis not present

## 2022-03-16 DIAGNOSIS — D631 Anemia in chronic kidney disease: Secondary | ICD-10-CM | POA: Diagnosis not present

## 2022-03-16 DIAGNOSIS — N186 End stage renal disease: Secondary | ICD-10-CM | POA: Diagnosis not present

## 2022-03-16 DIAGNOSIS — N2581 Secondary hyperparathyroidism of renal origin: Secondary | ICD-10-CM | POA: Diagnosis not present

## 2022-03-17 ENCOUNTER — Encounter: Payer: Self-pay | Admitting: Orthopaedic Surgery

## 2022-03-17 ENCOUNTER — Ambulatory Visit: Payer: Medicare Other | Admitting: Orthopaedic Surgery

## 2022-03-17 ENCOUNTER — Ambulatory Visit (INDEPENDENT_AMBULATORY_CARE_PROVIDER_SITE_OTHER): Payer: Medicare Other

## 2022-03-17 VITALS — BP 114/71 | HR 91 | Ht 65.0 in | Wt 131.0 lb

## 2022-03-17 DIAGNOSIS — M542 Cervicalgia: Secondary | ICD-10-CM

## 2022-03-17 DIAGNOSIS — G8929 Other chronic pain: Secondary | ICD-10-CM | POA: Diagnosis not present

## 2022-03-17 DIAGNOSIS — M25511 Pain in right shoulder: Secondary | ICD-10-CM | POA: Diagnosis not present

## 2022-03-17 NOTE — Progress Notes (Unsigned)
Office Visit Note   Patient: Katie Nunez           Date of Birth: 12/13/1955           MRN: [id number] Visit Date: 03/17/2022              Requested by: [name], [place],  [place] [address] PCP: [name], MD   Assessment & Plan: Visit Diagnoses:  1. Neck pain   2. Chronic right shoulder pain     Plan: Patient has TED hose at home she will use for swelling in the right lower extremity.  Post cortisone injections or swelling may have increased.  If she gets progressive symptoms she can return.  Follow-Up Instructions: No follow-ups on file.   Orders:  Orders Placed This Encounter  Procedures   XR Cervical Spine 2 or 3 views   XR Shoulder Right   No orders of the defined types were placed in this encounter.     Procedures: No procedures performed   Clinical Data: No additional findings.   Subjective: Chief Complaint  Patient presents with   Neck - Pain   Right Shoulder - Pain   Right Ankle - Pain    HPI 67 year old female with rheumatoid arthritis scleroderma Raynaud's renal failure with past history of ankle fracture returns with some right ankle swelling over the past few months.  She has had some cortisone injections in her foot noticed some increased swelling in her ankle after that time.  She is also had some right shoulder discomfort and ambulates with a cane.  States she has numbness in both feet some cramping in her hands.  Patient is on chronic dialysis. Review of Systems all systems updated unchanged from 11/25/2021.   Objective: Vital Signs: BP 114/71   Pulse 91   Ht '5\' 5"'$  (1.651 m)   Wt 131 lb (59.4 kg)   LMP 11/15/2008   BMI 21.80 kg/m   Physical Exam Constitutional:      Appearance: She is well-developed.  HENT:     Head: Normocephalic.     Right Ear: External ear normal.     Left Ear: External ear normal. There is no impacted cerumen.  Eyes:     Pupils: Pupils are equal, round, and  reactive to light.  Neck:     Thyroid: No thyromegaly.     Trachea: No tracheal deviation.  Cardiovascular:     Rate and Rhythm: Normal rate.  Pulmonary:     Effort: Pulmonary effort is normal.  Abdominal:     Palpations: Abdomen is soft.  Musculoskeletal:     Cervical back: No rigidity.  Skin:    General: Skin is warm and dry.  Neurological:     Mental Status: She is alert and oriented to person, place, and time.  Psychiatric:        Behavior: Behavior normal.     Ortho Exam negative drop arm test right shoulder.  Some tenderness over the long head of the biceps no distal biceps migration.  Good cervical range of motion.  Healed medial lateral ankle incisions.  Trace peripheral edema left ankle.  Specialty Comments:  No specialty comments available.  Imaging: XR Cervical Spine 2 or 3 views  Result Date: 03/18/2022 AP lateral cervical spine images are obtained and reviewed this shows reversal of normal cervical curvature.  Disc space height is maintained no subluxation.  Uncovertebral joints are normal. Impression: Cervical spine radiographs negative for  acute or chronic degenerative changes.  Nonspecific reversal of curvature.  XR Shoulder Right  Result Date: 03/18/2022 Multiple views right shoulder obtained and reviewed.  No glenohumeral subluxation.  Glenohumeral joint shows no degenerative changes acromioclavicular joint is normal.  Some calcified granulomas in the lung field. Impression: Right shoulder negative for acute bone changes or chronic degenerative changes.    PMFS History: Patient Active Problem List   Diagnosis Date Noted   Ankle swelling 11/26/2021   Neck pain 10/30/2020   Knee locking, left 10/05/2020   Abnormality of gait 02/25/2020   Protein-calorie malnutrition, severe 01/19/2020   Epistaxis    Sleep disturbance    Slow transit constipation    Hemorrhoids    Chronic systolic congestive heart failure ([place])    ESRD on dialysis ([place])    Right  hemiparesis ([place])    Pressure injury of skin 12/20/2019   Intraparenchymal hemorrhage of brain ([place]) 12/18/2019   ICH (intracerebral hemorrhage) ([place]) 12/12/2019   Viral disease 08/22/2019   Chronic right-sided heart failure ([place]) 07/09/2019   Supraventricular tachycardia 07/09/2019   Other cirrhosis of liver ([place]) 06/27/2019   Heart failure ([place]) 02/21/2019   Heart palpitations 08/14/2018   Other fluid overload 07/27/2018   Encounter for long-term (current) use of other medications 01/18/2018   Hypothyroidism 01/18/2018   Myalgia and myositis 01/18/2018   Chronic nephritis 01/18/2018   Other long term (current) drug therapy 01/18/2018   Sleep apnea 01/18/2018   Unspecified persistent mental disorders due to conditions classified elsewhere 01/18/2018   Venous reflux 01/18/2018   Vitamin B12 deficiency 01/18/2018   Vitamin D deficiency 01/18/2018   Obstructive lung disease ([place]) 12/30/2017   Other pruritus 12/27/2017   Eruption cyst 12/19/2017   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 ([place]) 11/28/2017   Chronic cough 11/11/2017   Pulmonary hypertension ([place]) 11/11/2017   Pulmonary arterial hypertension ([place]) 10/07/2017   Acute ITP ([place]) 01/05/2017   ESRD (end stage renal disease) ([place]) 12/28/2016   Encounter for removal of sutures 09/09/2016   Coagulation defect, unspecified ([place]) 09/01/2016   Underimmunization status 08/05/2016   Hemolytic anemia ([place]) 07/26/2016   Epistaxis, recurrent 07/26/2016   Unspecified protein-calorie malnutrition ([place]) 07/13/2016   Aftercare including intermittent dialysis ([place]) 07/07/2016   Anemia in chronic kidney disease 07/07/2016   Hypokalemia 07/07/2016   Iron deficiency anemia, unspecified 07/07/2016   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   Nonrheumatic mitral (valve) prolapse 07/07/2016   Other irritable bowel syndrome 07/07/2016   Other secondary thrombocytopenia 07/07/2016   Secondary hyperparathyroidism of renal  origin ([place]) 07/07/2016   Thrombocytopenia ([place]) 07/01/2016   ARF (acute renal failure) ([place]) 06/25/2016   Anxiety 06/25/2016   Bilateral carpal tunnel syndrome 10/22/2015   Chronic gout without tophus 10/22/2015   Chronic nonintractable headache 10/08/2015   Fibroid uterus 01/03/2012   H/O vitamin D deficiency 01/03/2012   Post-menopausal 01/03/2012   Hereditary and idiopathic peripheral neuropathy 11/19/2011   Intestinal malabsorption 123XX123   Lichen planus 123XX123   Low back pain 11/19/2011   Diffuse spasm of esophagus 11/11/2011   ESR raised 11/11/2011   Postinflammatory pulmonary fibrosis ([place]) 11/11/2011   Scleroderma ([place]) 11/16/2010   Rheumatoid arthritis ([place]) 11/16/2010   Raynaud's disease 11/16/2010   Symptomatic menopausal or female climacteric states 11/16/2010   Past Medical History:  Diagnosis Date   Achalasia    Anxiety    Dysplasia of cervix, low grade (CIN 1)    Environmental allergies    "all year long" (12/27/2016)  ESRD (end stage renal disease) on dialysis ([place])    "TTS; [place]" (12/27/2016)   Fibromyalgia    GERD (gastroesophageal reflux disease)    Gout    Hypertension    IBS (irritable bowel syndrome)    MVP (mitral valve prolapse)    RA (rheumatoid arthritis) ([place])    FOLLOWED BY DR. SHANAHAN   Raynaud's disease    Scleroderma ([place])    Seasonal allergies    Thrombocytopenia ([place]) 07/01/2016   Acute fall to 13,000 07/01/16   Tubular adenoma 01/08/2008   CECUM   Vitamin D deficiency     Family History  Problem Relation Age of Onset   Hypertension Mother    Diabetes Mother    Heart disease Father    Hypertension Maternal Aunt    Diabetes Maternal Grandmother    Heart disease Paternal Grandfather    Cerebral palsy Cousin        1ST COUSIN?   Diabetes Paternal Grandmother     Past Surgical History:  Procedure Laterality Date   ANKLE FRACTURE SURGERY Right    AV FISTULA PLACEMENT Left 06/28/2016   Procedure: left arm  ARTERIOVENOUS (AV) FISTULA CREATION;  Surgeon: Rosetta Posner, MD;  Location[facility];  Service: Vascular;  Laterality: Left;   [place] Left 09/27/2016   Procedure: LEFT UPPER ARM CEPHALIC VEIN TRANSPOSITION;  Surgeon: Rosetta Posner, MD;  [facility];  Service: Vascular;  Laterality: Left;   BREAST BIOPSY     "? side"   [place]     COLONOSCOPY W/ BIOPSIES  01/08/2008   INSERTION OF DIALYSIS CATHETER Right 06/28/2016   Procedure: INSERTION OF DIALYSIS CATHETER, right internal jugular;  Surgeon: Rosetta Posner, MD;  Location: [place];  Service: Vascular;  Laterality: Right;   MYOMECTOMY     NASAL ENDOSCOPY WITH EPISTAXIS CONTROL N/A 12/29/2019   Procedure: NASAL ENDOSCOPY WITH EPISTAXIS CONTROL;  Surgeon: Leta Baptist, MD;  Location: [place];  Service: ENT;  Laterality: N/A;   PELVIC LAPAROSCOPY  2011   superficial thrombophlebitis Left 07-2014   Social History   Occupational History   Not on file  Tobacco Use   Smoking status: Never   Smokeless tobacco: Never  Vaping Use   Vaping Use: Never used  Substance and Sexual Activity   Alcohol use: No   Drug use: No   Sexual activity: Not Currently    Birth control/protection: Post-menopausal

## 2022-03-18 DIAGNOSIS — N186 End stage renal disease: Secondary | ICD-10-CM | POA: Diagnosis not present

## 2022-03-18 DIAGNOSIS — Z992 Dependence on renal dialysis: Secondary | ICD-10-CM | POA: Diagnosis not present

## 2022-03-18 DIAGNOSIS — D631 Anemia in chronic kidney disease: Secondary | ICD-10-CM | POA: Diagnosis not present

## 2022-03-18 DIAGNOSIS — N2581 Secondary hyperparathyroidism of renal origin: Secondary | ICD-10-CM | POA: Diagnosis not present

## 2022-03-18 DIAGNOSIS — D509 Iron deficiency anemia, unspecified: Secondary | ICD-10-CM | POA: Diagnosis not present

## 2022-03-19 ENCOUNTER — Ambulatory Visit
Admission: RE | Admit: 2022-03-19 | Discharge: 2022-03-19 | Disposition: A | Discharge status: Home / Self Care | Payer: Medicare Other | Source: Ambulatory Visit | Attending: Emergency Medicine | Admitting: Emergency Medicine

## 2022-03-19 DIAGNOSIS — I358 Other nonrheumatic aortic valve disorders: Secondary | ICD-10-CM | POA: Diagnosis not present

## 2022-03-19 DIAGNOSIS — R918 Other nonspecific abnormal finding of lung field: Secondary | ICD-10-CM | POA: Diagnosis not present

## 2022-03-19 DIAGNOSIS — J849 Interstitial pulmonary disease, unspecified: Secondary | ICD-10-CM | POA: Diagnosis not present

## 2022-03-19 DIAGNOSIS — J8410 Pulmonary fibrosis, unspecified: Secondary | ICD-10-CM

## 2022-03-19 DIAGNOSIS — I2510 Atherosclerotic heart disease of native coronary artery without angina pectoris: Secondary | ICD-10-CM | POA: Diagnosis not present

## 2022-03-20 DIAGNOSIS — Z992 Dependence on renal dialysis: Secondary | ICD-10-CM | POA: Diagnosis not present

## 2022-03-20 DIAGNOSIS — N2581 Secondary hyperparathyroidism of renal origin: Secondary | ICD-10-CM | POA: Diagnosis not present

## 2022-03-20 DIAGNOSIS — D631 Anemia in chronic kidney disease: Secondary | ICD-10-CM | POA: Diagnosis not present

## 2022-03-20 DIAGNOSIS — D509 Iron deficiency anemia, unspecified: Secondary | ICD-10-CM | POA: Diagnosis not present

## 2022-03-20 DIAGNOSIS — R0609 Other forms of dyspnea: Secondary | ICD-10-CM | POA: Diagnosis not present

## 2022-03-20 DIAGNOSIS — I270 Primary pulmonary hypertension: Secondary | ICD-10-CM | POA: Diagnosis not present

## 2022-03-20 DIAGNOSIS — N186 End stage renal disease: Secondary | ICD-10-CM | POA: Diagnosis not present

## 2022-03-23 DIAGNOSIS — Z992 Dependence on renal dialysis: Secondary | ICD-10-CM | POA: Diagnosis not present

## 2022-03-23 DIAGNOSIS — D509 Iron deficiency anemia, unspecified: Secondary | ICD-10-CM | POA: Diagnosis not present

## 2022-03-23 DIAGNOSIS — N186 End stage renal disease: Secondary | ICD-10-CM | POA: Diagnosis not present

## 2022-03-23 DIAGNOSIS — D631 Anemia in chronic kidney disease: Secondary | ICD-10-CM | POA: Diagnosis not present

## 2022-03-23 DIAGNOSIS — N2581 Secondary hyperparathyroidism of renal origin: Secondary | ICD-10-CM | POA: Diagnosis not present

## 2022-03-24 ENCOUNTER — Emergency Department (HOSPITAL_BASED_OUTPATIENT_CLINIC_OR_DEPARTMENT_OTHER)
Admission: EM | Admit: 2022-03-24 | Discharge: 2022-03-24 | Disposition: A | Discharge status: Home / Self Care | Payer: Medicare Other | Attending: Emergency Medicine | Admitting: Emergency Medicine

## 2022-03-24 ENCOUNTER — Emergency Department (HOSPITAL_BASED_OUTPATIENT_CLINIC_OR_DEPARTMENT_OTHER): Payer: Medicare Other | Admitting: Radiology

## 2022-03-24 ENCOUNTER — Encounter (HOSPITAL_BASED_OUTPATIENT_CLINIC_OR_DEPARTMENT_OTHER): Payer: Self-pay | Admitting: Emergency Medicine

## 2022-03-24 ENCOUNTER — Ambulatory Visit: Payer: Self-pay

## 2022-03-24 ENCOUNTER — Other Ambulatory Visit: Payer: Self-pay

## 2022-03-24 DIAGNOSIS — R059 Cough, unspecified: Secondary | ICD-10-CM | POA: Diagnosis not present

## 2022-03-24 DIAGNOSIS — I5022 Chronic systolic (congestive) heart failure: Secondary | ICD-10-CM | POA: Diagnosis not present

## 2022-03-24 DIAGNOSIS — I132 Hypertensive heart and chronic kidney disease with heart failure and with stage 5 chronic kidney disease, or end stage renal disease: Secondary | ICD-10-CM | POA: Insufficient documentation

## 2022-03-24 DIAGNOSIS — R042 Hemoptysis: Secondary | ICD-10-CM | POA: Diagnosis not present

## 2022-03-24 DIAGNOSIS — Z7982 Long term (current) use of aspirin: Secondary | ICD-10-CM | POA: Insufficient documentation

## 2022-03-24 DIAGNOSIS — R051 Acute cough: Secondary | ICD-10-CM | POA: Insufficient documentation

## 2022-03-24 DIAGNOSIS — N186 End stage renal disease: Secondary | ICD-10-CM | POA: Diagnosis not present

## 2022-03-24 DIAGNOSIS — Z79899 Other long term (current) drug therapy: Secondary | ICD-10-CM | POA: Insufficient documentation

## 2022-03-24 DIAGNOSIS — E039 Hypothyroidism, unspecified: Secondary | ICD-10-CM | POA: Insufficient documentation

## 2022-03-24 DIAGNOSIS — Z992 Dependence on renal dialysis: Secondary | ICD-10-CM | POA: Insufficient documentation

## 2022-03-24 LAB — CBC
HCT: 29.4 % — ABNORMAL LOW (ref 36.0–46.0)
Hemoglobin: 9.2 g/dL — ABNORMAL LOW (ref 12.0–15.0)
MCH: 28.7 pg (ref 26.0–34.0)
MCHC: 31.3 g/dL (ref 30.0–36.0)
MCV: 91.6 fL (ref 80.0–100.0)
Platelets: 143 10*3/uL — ABNORMAL LOW (ref 150–400)
RBC: 3.21 MIL/uL — ABNORMAL LOW (ref 3.87–5.11)
RDW: 18.3 % — ABNORMAL HIGH (ref 11.5–15.5)
WBC: 2.9 10*3/uL — ABNORMAL LOW (ref 4.0–10.5)
nRBC: 0 % (ref 0.0–0.2)

## 2022-03-24 LAB — COMPREHENSIVE METABOLIC PANEL
ALT: 15 U/L (ref 0–44)
AST: 29 U/L (ref 15–41)
Albumin: 3.7 g/dL (ref 3.5–5.0)
Alkaline Phosphatase: 72 U/L (ref 38–126)
Anion gap: 13 (ref 5–15)
BUN: 52 mg/dL — ABNORMAL HIGH (ref 8–23)
CO2: 25 mmol/L (ref 22–32)
Calcium: 10.1 mg/dL (ref 8.9–10.3)
Chloride: 101 mmol/L (ref 98–111)
Creatinine, Ser: 8.05 mg/dL — ABNORMAL HIGH (ref 0.44–1.00)
GFR, Estimated: 5 mL/min — ABNORMAL LOW (ref >60)
Glucose, Bld: 81 mg/dL (ref 70–99)
Potassium: 5.1 mmol/L (ref 3.5–5.1)
Sodium: 139 mmol/L (ref 135–145)
Total Bilirubin: 0.4 mg/dL (ref 0.3–1.2)
Total Protein: 7 g/dL (ref 6.5–8.1)

## 2022-03-24 MED ORDER — BENZONATATE 100 MG PO CAPS: 100.0000 mg | ORAL_CAPSULE | Freq: Three times a day (TID) | ORAL | 0 refills | Status: DC

## 2022-03-24 MED ORDER — SALINE SPRAY 0.65 % NA SOLN: 1.0000 | NASAL | 0 refills | Status: DC | PRN

## 2022-03-24 MED ORDER — FLUTICASONE PROPIONATE 50 MCG/ACT NA SUSP: 1.0000 | Freq: Every day | NASAL | 0 refills | Status: AC

## 2022-03-24 MED ORDER — GUAIFENESIN ER 600 MG PO TB12: 600.0000 mg | ORAL_TABLET | Freq: Two times a day (BID) | ORAL | 0 refills | Status: AC

## 2022-03-24 NOTE — ED Provider Notes (Signed)
[facility] Provider Note  CSN: [id number] Arrival date & time: 03/24/22 1412  Chief Complaint(s) Hemoptysis  HPI Katie Nunez is a 67 y.o. female with past medical history as below, significant for anxiety, ESRD on HD TTS, fibromyalgia, hypertension, IBS, MVP, RA, thrombocytopenia who presents to the ED with complaint of cough.  Patient's chronic cough, chronic increased mucus production per report she had notably 3 days ago which she has recurrently, also had some trace amount of hemoptysis noted in her sputum with coughing.  Symptoms have slightly improved in the past 3 days.  No difficulty breathing, she does use 2 L nasal cannula at baseline, primarily at nighttime.  Is not had increased her typical oxygen use.  No chest pain, no palpitations, no nausea or vomiting.  No BRBPR or melena.  Takes daily aspirin baby but otherwise no thinners.  No missed dialysis sessions.  She follows with ENT.  This has been a recurrent problem.  Past Medical History Past Medical History:  Diagnosis Date   Achalasia    Anxiety    Dysplasia of cervix, low grade (CIN 1)    Environmental allergies    "all year long" (12/27/2016)   ESRD (end stage renal disease) on dialysis ([place])    "TTS; [facility]" (12/27/2016)   Fibromyalgia    GERD (gastroesophageal reflux disease)    Gout    Hypertension    IBS (irritable bowel syndrome)    MVP (mitral valve prolapse)    RA (rheumatoid arthritis) ([place])    FOLLOWED BY DR. SHANAHAN   Raynaud's disease    Scleroderma ([place])    Seasonal allergies    Thrombocytopenia ([place]) 07/01/2016   Acute fall to 13,000 07/01/16   Tubular adenoma 01/08/2008   CECUM   Vitamin D deficiency    Patient Active Problem List   Diagnosis Date Noted   Ankle swelling 11/26/2021   Neck pain 10/30/2020   Knee locking, left 10/05/2020   Abnormality of gait 02/25/2020   Protein-calorie malnutrition, severe 01/19/2020   Epistaxis    Sleep  disturbance    Slow transit constipation    Hemorrhoids    Chronic systolic congestive heart failure ([place])    ESRD on dialysis ([place])    Right hemiparesis ([place])    Pressure injury of skin 12/20/2019   Intraparenchymal hemorrhage of brain ([place]) 12/18/2019   ICH (intracerebral hemorrhage) ([place]) 12/12/2019   Viral disease 08/22/2019   Chronic right-sided heart failure ([place]) 07/09/2019   Supraventricular tachycardia 07/09/2019   Other cirrhosis of liver ([place]) 06/27/2019   Heart failure ([place]) 02/21/2019   Heart palpitations 08/14/2018   Other fluid overload 07/27/2018   Encounter for long-term (current) use of other medications 01/18/2018   Hypothyroidism 01/18/2018   Myalgia and myositis 01/18/2018   Chronic nephritis 01/18/2018   Other long term (current) drug therapy 01/18/2018   Sleep apnea 01/18/2018   Unspecified persistent mental disorders due to conditions classified elsewhere 01/18/2018   Venous reflux 01/18/2018   Vitamin B12 deficiency 01/18/2018   Vitamin D deficiency 01/18/2018   Obstructive lung disease ([place]) 12/30/2017   Other pruritus 12/27/2017   Eruption cyst 12/19/2017   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 ([place]) 11/28/2017   Chronic cough 11/11/2017   Pulmonary hypertension ([place]) 11/11/2017   Pulmonary arterial hypertension ([place]) 10/07/2017   Acute ITP ([place]) 01/05/2017   ESRD (end stage renal disease) ([place]) 12/28/2016   Encounter for removal of sutures 09/09/2016   Coagulation  defect, unspecified ([place]) 09/01/2016   Underimmunization status 08/05/2016   Hemolytic anemia ([place]) 07/26/2016   Epistaxis, recurrent 07/26/2016   Unspecified protein-calorie malnutrition ([place]) 07/13/2016   Aftercare including intermittent dialysis ([place]) 07/07/2016   Anemia in chronic kidney disease 07/07/2016   Hypokalemia 07/07/2016   Iron deficiency anemia, unspecified 07/07/2016   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   Nonrheumatic mitral (valve)  prolapse 07/07/2016   Other irritable bowel syndrome 07/07/2016   Other secondary thrombocytopenia 07/07/2016   Secondary hyperparathyroidism of renal origin ([place]) 07/07/2016   Thrombocytopenia ([place]) 07/01/2016   ARF (acute renal failure) ([place]) 06/25/2016   Anxiety 06/25/2016   Bilateral carpal tunnel syndrome 10/22/2015   Chronic gout without tophus 10/22/2015   Chronic nonintractable headache 10/08/2015   Fibroid uterus 01/03/2012   H/O vitamin D deficiency 01/03/2012   Post-menopausal 01/03/2012   Hereditary and idiopathic peripheral neuropathy 11/19/2011   Intestinal malabsorption 123XX123   Lichen planus 123XX123   Low back pain 11/19/2011   Diffuse spasm of esophagus 11/11/2011   ESR raised 11/11/2011   Postinflammatory pulmonary fibrosis ([place]) 11/11/2011   Scleroderma ([place]) 11/16/2010   Rheumatoid arthritis ([place]) 11/16/2010   Raynaud's disease 11/16/2010   Symptomatic menopausal or female climacteric states 11/16/2010   Home Medication(s) Prior to Admission medications   Medication Sig Start Date End Date Taking? Authorizing Provider  benzonatate (TESSALON) 100 MG capsule Take 1 capsule (100 mg total) by mouth every 8 (eight) hours. 03/24/22  Yes Wynona Dove A, DO  fluticasone (FLONASE) 50 MCG/ACT nasal spray Place 1 spray into both nostrils daily for 7 days. 03/24/22 03/31/22 Yes Jeanell Sparrow, DO  guaiFENesin (MUCINEX) 600 MG 12 hr tablet Take 1 tablet (600 mg total) by mouth 2 (two) times daily for 5 days. 03/24/22 03/29/22 Yes Jeanell Sparrow, DO  sodium chloride (OCEAN) 0.65 % SOLN nasal spray Place 1 spray into both nostrils as needed for congestion. 03/24/22  Yes Wynona Dove A, DO  albuterol (VENTOLIN HFA) 108 (90 Base) MCG/ACT inhaler Inhale 2 puffs into the lungs every 6 (six) hours as needed for wheezing or shortness of breath. 12/02/21   Collene Gobble, MD  ambrisentan (LETAIRIS) 5 MG tablet Take 10 mg by mouth daily.    [provider]  aspirin EC 81 MG  tablet Take 1 tablet (81 mg total) by mouth daily. Swallow whole. 08/04/20   Garvin Fila, MD  atorvastatin (LIPITOR) 80 MG tablet Take 1 tablet (80 mg total) by mouth daily. 06/18/21   Garvin Fila, MD  azelastine (ASTELIN) 0.1 % nasal spray Place 1 spray into both nostrils 2 (two) times daily as needed for rhinitis. Use in each nostril as directed 01/23/20   Love, Ivan Anchors, PA-C  calcium acetate (PHOSLO) 667 MG capsule Take by mouth. 05/01/20   [provider]  camphor-menthol Timoteo Ace) lotion Apply topically 2 (two) times daily. Patient taking differently: Apply topically as needed. 01/23/20   Love, Ivan Anchors, PA-C  clonazePAM (KLONOPIN) 0.5 MG tablet Take 1 tablet (0.5 mg total) by mouth 2 (two) times daily as needed for anxiety. 01/23/20   Love, Ivan Anchors, PA-C  co-enzyme Q-10 30 MG capsule Take 7 capsules (210 mg total) by mouth 3 (three) times daily. 06/10/21   Garvin Fila, MD  cyclobenzaprine (FLEXERIL) 5 MG tablet Take 1 tablet (5 mg total) by mouth every 8 (eight) hours as needed for muscle spasms. 11/25/21   Bronson Ing, DPM  docusate sodium (COLACE) 100 MG capsule Take 2  capsules (200 mg total) by mouth daily. Patient taking differently: Take 200 mg by mouth as needed. 01/23/20   Love, Ivan Anchors, PA-C  doxycycline (VIBRA-TABS) 100 MG tablet Take 100 mg by mouth 2 (two) times daily. 07/16/21   [provider]  famotidine (PEPCID) 20 MG tablet Take 1 tablet (20 mg total) by mouth daily as needed for heartburn or indigestion. Patient taking differently: Take 40 mg by mouth 2 (two) times daily. 01/23/20   Love, Ivan Anchors, PA-C  gabapentin (NEURONTIN) 100 MG capsule Take 1 capsule (100 mg total) by mouth 3 (three) times daily. Take 1 capsule in morning and two at night 06/15/21   Garvin Fila, MD  hydrocerin (EUCERIN) CREA Apply 1 application topically 2 (two) times daily. To dry skin--avoid IV site 01/23/20   Love, Ivan Anchors, PA-C  hydrOXYzine (ATARAX/VISTARIL) 25 MG tablet  Take 25 mg by mouth every 8 (eight) hours as needed for itching.  06/22/19   [provider]  levocetirizine (XYZAL) 5 MG tablet SMARTSIG:1 Tablet(s) By Mouth Every Evening 04/15/20   [provider]  lidocaine-prilocaine (EMLA) cream Apply 1 application topically as needed. Apply to toes as needed for pain 03/07/20   Bronson Ing, DPM  lubiprostone (AMITIZA) 8 MCG capsule Take 8 mcg by mouth 2 (two) times daily with a meal.    [provider]  multivitamin (RENA-VIT) TABS tablet Take 1 tablet by mouth daily at 6 (six) AM. 01/24/20   Love, Ivan Anchors, PA-C  nitroGLYCERIN (NITRO-DUR) 0.2 mg/hr patch Place 1 patch (0.2 mg total) onto the skin daily. Apply near great toes bilateral to top of foot every morning.  May remove at night 11/25/21   Bronson Ing, DPM  oxymetazoline (AFRIN) 0.05 % nasal spray Place 1 spray into both nostrils 2 (two) times daily as needed for congestion. 01/22/20   Love, Ivan Anchors, PA-C  Selexipag (UPTRAVI) 800 MCG TABS Take 1 tablet (800 mcg total) by mouth in the morning and at bedtime. 01/23/20   Love, Ivan Anchors, PA-C  sodium chloride (OCEAN) 0.65 % SOLN nasal spray Place 1 spray into both nostrils 5 (five) times daily. At least 5 times a day 01/22/20   Love, Ivan Anchors, PA-C                                                                                                                                    Past Surgical History Past Surgical History:  Procedure Laterality Date   ANKLE FRACTURE SURGERY Right    AV FISTULA PLACEMENT Left 06/28/2016   Procedure: left arm ARTERIOVENOUS (AV) FISTULA CREATION;  Surgeon: Rosetta Posner, MD;  Location: [facility] OR;  Service: Vascular;  Laterality: Left;   [place] Left 09/27/2016   Procedure: LEFT UPPER ARM CEPHALIC VEIN TRANSPOSITION;  Surgeon: Rosetta Posner, MD;  Location: [facility] OR;  Service: Vascular;  Laterality: Left;   BREAST BIOPSY     "?  side"   CESAREAN SECTION  1994   CO2 LASER OF CERVIX      COLONOSCOPY W/ BIOPSIES  01/08/2008   INSERTION OF DIALYSIS CATHETER Right 06/28/2016   Procedure: INSERTION OF DIALYSIS CATHETER, right internal jugular;  Surgeon: Rosetta Posner, MD;  Location: [facility] OR;  Service: Vascular;  Laterality: Right;   MYOMECTOMY     NASAL ENDOSCOPY WITH EPISTAXIS CONTROL N/A 12/29/2019   Procedure: NASAL ENDOSCOPY WITH EPISTAXIS CONTROL;  Surgeon: Leta Baptist, MD;  Location: [facility] OR;  Service: ENT;  Laterality: N/A;   PELVIC LAPAROSCOPY  2011   superficial thrombophlebitis Left 07-2014   Family History Family History  Problem Relation Age of Onset   Hypertension Mother    Diabetes Mother    Heart disease Father    Hypertension Maternal Aunt    Diabetes Maternal Grandmother    Heart disease Paternal Grandfather    Cerebral palsy Cousin        1ST COUSIN?   Diabetes Paternal Grandmother     Social History Social History   Tobacco Use   Smoking status: Never   Smokeless tobacco: Never  Vaping Use   Vaping Use: Never used  Substance Use Topics   Alcohol use: No   Drug use: No   Allergies Other, Savella [milnacipran hcl], and Tape  Review of Systems Review of Systems  Constitutional:  Negative for activity change and fever.  HENT:  Negative for facial swelling and trouble swallowing.   Eyes:  Negative for discharge and redness.  Respiratory:  Positive for cough. Negative for shortness of breath.        Hemoptysis  Cardiovascular:  Negative for chest pain and palpitations.  Gastrointestinal:  Negative for abdominal pain and nausea.  Genitourinary:  Negative for dysuria and flank pain.  Musculoskeletal:  Negative for back pain and gait problem.  Skin:  Negative for pallor and rash.  Neurological:  Negative for syncope and headaches.    Physical Exam Vital Signs  I have reviewed the triage vital signs BP (!) 142/91 (BP Location: Right Arm)   Pulse 84   Temp 98.8 F (37.1 C) (Oral)   Resp 16   Ht '5\' 5"'$  (1.651 m)   Wt 59.4 kg   LMP  11/15/2008   SpO2 98%   BMI 21.79 kg/m  Physical Exam Vitals and nursing note reviewed.  Constitutional:      General: She is not in acute distress.    Appearance: Normal appearance.  HENT:     Head: Normocephalic and atraumatic. No raccoon eyes, Battle's sign, right periorbital erythema or left periorbital erythema.     Jaw: There is normal jaw occlusion.     Comments: No drooling stridor or trismus    Right Ear: External ear normal.     Left Ear: External ear normal.     Nose: Nose normal.     Mouth/Throat:     Mouth: Mucous membranes are moist.  Eyes:     General: No scleral icterus.       Right eye: No discharge.        Left eye: No discharge.  Cardiovascular:     Rate and Rhythm: Normal rate and regular rhythm.     Pulses: Normal pulses.     Heart sounds: Normal heart sounds.  Pulmonary:     Effort: Pulmonary effort is normal. No respiratory distress.     Breath sounds: Normal breath sounds.  Abdominal:     General: Abdomen is flat.  Tenderness: There is no abdominal tenderness.  Musculoskeletal:     Cervical back: No rigidity.     Right lower leg: No edema.     Left lower leg: No edema.  Skin:    General: Skin is warm and dry.     Capillary Refill: Capillary refill takes less than 2 seconds.       Neurological:     Mental Status: She is alert.  Psychiatric:        Mood and Affect: Mood normal.        Behavior: Behavior normal.     ED Results and Treatments Labs (all labs ordered are listed, but only abnormal results are displayed) Labs Reviewed  COMPREHENSIVE METABOLIC PANEL - Abnormal; Notable for the following components:      Result Value   BUN 52 (*)    Creatinine, Ser 8.05 (*)    GFR, Estimated 5 (*)    All other components within normal limits  CBC - Abnormal; Notable for the following components:   WBC 2.9 (*)    RBC 3.21 (*)    Hemoglobin 9.2 (*)    HCT 29.4 (*)    RDW 18.3 (*)    Platelets 143 (*)    All other components within  normal limits  URINALYSIS, ROUTINE W REFLEX MICROSCOPIC                                                                                                                          Radiology DG Chest 2 View  Result Date: 03/24/2022 CLINICAL DATA:  Cough EXAM: CHEST - 2 VIEW COMPARISON:  CXR 12/15/19 FINDINGS: No pleural effusion. No pneumothorax. Unchanged cardiac and mediastinal contours. There are bibasilar linear airspace opacities, which are favored to represent atelectasis. No radiographically apparent displaced rib fractures. Visualized upper abdomen is unremarkable. Vertebral body heights maintained. IMPRESSION: There are bibasilar linear airspace opacities, which are favored to represent atelectasis. Electronically Signed   By: Marin Roberts M.D.   On: 03/24/2022 16:49    Pertinent labs & imaging results that were available during my care of the patient were reviewed by me and considered in my medical decision making (see MDM for details).  Medications Ordered in ED Medications - No data to display  Procedures Procedures  (including critical care time)  Medical Decision Making / ED Course    Medical Decision Making:    ENAS SUERO is a 67 y.o. female with past medical history as below, significant for anxiety, ESRD on HD TTS, fibromyalgia, hypertension, IBS, MVP, RA, thrombocytopenia who presents to the ED with complaint of cough.. The complaint involves an extensive differential diagnosis and also carries with it a high risk of complications and morbidity.  Serious etiology was considered. Ddx includes but is not limited to: In my evaluation of this patient's presenting symptoms my DDx includes, but is not limited to, pneumonia, pulmonary embolism, pneumothorax, pulmonary edema, metabolic acidosis, asthma, COPD, cardiac cause, anemia, anxiety, alveolar  hemorrhage, PE, upper airway bleeding, etc.    Complete initial physical exam performed, notably the patient  was NAD, ambient air, no conversational dib.    Reviewed and confirmed nursing documentation for past medical history, family history, social history.  Vital signs reviewed.   Patient was CT chest high-resolution 3/9 >> diffuse bilateral bronchial thickening, unchanged, favored to be smoking-related respiratory bronchiolitis cardiomegaly, cirrhosis per radiology report.  Chronic changes, roughly unchanged  Chest x-ray stable.  Low suspicion for acute life-threatening etiology of her scant hemoptysis  No hemoptysis while in the ED.  No hypoxia.  Feeling at her baseline currently.  Will discharge with supportive care, antitussive, follow-up PCP.  Return if symptoms worsen  The patient improved significantly and was discharged in stable condition. Detailed discussions were had with the patient regarding current findings, and need for close f/u with PCP or on call doctor. The patient has been instructed to return immediately if the symptoms worsen in any way for re-evaluation. Patient verbalized understanding and is in agreement with current care plan. All questions answered prior to discharge.    Clinical Course as of 03/24/22 1701  Wed Mar 24, 2022  1659 CXR w/ atelectasis [SG]    Clinical Course User Index [SG] Jeanell Sparrow, DO     Additional history obtained: -Additional history obtained from family -External records from outside source obtained and reviewed including: Chart review including previous notes, labs, imaging, consultation notes including primary care documentation, prior labs and imaging, medications   Lab Tests: -I ordered, reviewed, and interpreted labs.   The pertinent results include:   Labs Reviewed  COMPREHENSIVE METABOLIC PANEL - Abnormal; Notable for the following components:      Result Value   BUN 52 (*)    Creatinine, Ser 8.05 (*)    GFR,  Estimated 5 (*)    All other components within normal limits  CBC - Abnormal; Notable for the following components:   WBC 2.9 (*)    RBC 3.21 (*)    Hemoglobin 9.2 (*)    HCT 29.4 (*)    RDW 18.3 (*)    Platelets 143 (*)    All other components within normal limits  URINALYSIS, ROUTINE W REFLEX MICROSCOPIC    Notable for hemoglobin reduced, similar to her baseline, creatinine similar to baseline.  BUN also similar to baseline  EKG   EKG Interpretation  Date/Time:    Ventricular Rate:    PR Interval:    QRS Duration:   QT Interval:    QTC Calculation:   R Axis:     Text Interpretation:           Imaging Studies ordered: I ordered imaging studies including chest x-ray I independently visualized the following imaging with scope of interpretation limited to  determining acute life threatening conditions related to emergency care: atelectasis I independently visualized and interpreted imaging. I agree with the radiologist interpretation   Medicines ordered and prescription drug management: Meds ordered this encounter  Medications   sodium chloride (OCEAN) 0.65 % SOLN nasal spray    Sig: Place 1 spray into both nostrils as needed for congestion.    Dispense:  44 mL    Refill:  0   fluticasone (FLONASE) 50 MCG/ACT nasal spray    Sig: Place 1 spray into both nostrils daily for 7 days.    Dispense:  15.8 mL    Refill:  0   guaiFENesin (MUCINEX) 600 MG 12 hr tablet    Sig: Take 1 tablet (600 mg total) by mouth 2 (two) times daily for 5 days.    Dispense:  10 tablet    Refill:  0   benzonatate (TESSALON) 100 MG capsule    Sig: Take 1 capsule (100 mg total) by mouth every 8 (eight) hours.    Dispense:  21 capsule    Refill:  0    -I have reviewed the patients home medicines and have made adjustments as needed   Consultations Obtained: Not applicable  Cardiac Monitoring: Not applicable  Social Determinants of Health:  Diagnosis or treatment significantly limited  by social determinants of health: na   Reevaluation: After the interventions noted above, I reevaluated the patient and found that they have improved  Co morbidities that complicate the patient evaluation  Past Medical History:  Diagnosis Date   Achalasia    Anxiety    Dysplasia of cervix, low grade (CIN 1)    Environmental allergies    "all year long" (12/27/2016)   ESRD (end stage renal disease) on dialysis ([place])    "TTS; [place] [facility]" (12/27/2016)   Fibromyalgia    GERD (gastroesophageal reflux disease)    Gout    Hypertension    IBS (irritable bowel syndrome)    MVP (mitral valve prolapse)    RA (rheumatoid arthritis) ([place])    FOLLOWED BY DR. SHANAHAN   Raynaud's disease    Scleroderma ([place])    Seasonal allergies    Thrombocytopenia ([place]) 07/01/2016   Acute fall to 13,000 07/01/16   Tubular adenoma 01/08/2008   CECUM   Vitamin D deficiency       Dispostion: Disposition decision including need for hospitalization was considered, and patient discharged from emergency department.    Final Clinical Impression(s) / ED Diagnoses Final diagnoses:  Acute cough     This chart was dictated using voice recognition software.  Despite best efforts to proofread,  errors can occur which can change the documentation meaning.    Wynona Dove A, DO 03/24/22 1701

## 2022-03-24 NOTE — Discharge Instructions (Addendum)
It was a pleasure caring for you today in the emergency department.  Please return to the emergency department for any worsening or worrisome symptoms.  Your chest x-ray did not demonstrate evidence of pneumonia or fluid in your lungs.  Shows some chronic changes that were seen on previous imaging  If bleeding gets worse please return to emergency department  Consider using saline rinse, Nettie pot to help with your phlegm

## 2022-03-24 NOTE — Telephone Encounter (Signed)
  Chief Complaint: cough with blood Symptoms: cough with blood at times, has bad nose bleeds at times as well  Frequency: since yesterday Pertinent Negatives: Patient denies SOB more than norrmal Disposition: [x] ED /[] Urgent Care (no appt availability in office) / [] Appointment(In office/virtual)/ []  [facility]/ [] Home Care/ [] Refused Recommended Disposition /[] [facility]/ []  Follow-up with PCP Additional Notes: pt has chronic mucus and nose bleeds, has recently had biopsy and tissue removal on L and R lung, had recent CT chest but pt states the blood coughing up is more than she normally has and is concerned. Offered UC but pt didn't feel like they would be able to assist her so discussed [facility] and pt going to [facility] ED and be seen.   Reason for Disposition  [1] Coughed up blood AND [2] > 1 tablespoon (15 ml)   (Exception: Blood-tinged sputum.)  Answer Assessment - Initial Assessment Questions 1. ONSET: "When did the cough begin?"      Yesterday  3. SPUTUM: "Describe the color of your sputum" (none, dry cough; clear, white, yellow, green)     Blood tinged 4. HEMOPTYSIS: "Are you coughing up any blood?" If so ask: "How much?" (flecks, streaks, tablespoons, etc.)     Yes  5. DIFFICULTY BREATHING: "Are you having difficulty breathing?" If Yes, ask: "How bad is it?" (e.g., mild, moderate, severe)    - MILD: No SOB at rest, mild SOB with walking, speaks normally in sentences, can lie down, no retractions, pulse < 100.    - MODERATE: SOB at rest, SOB with minimal exertion and prefers to sit, cannot lie down flat, speaks in phrases, mild retractions, audible wheezing, pulse 100-120.    - SEVERE: Very SOB at rest, speaks in single words, struggling to breathe, sitting hunched forward, retractions, pulse > 120      Mild but normal for her 6. FEVER: "Do you have a fever?" If Yes, ask: "What is your temperature, how was it measured, and when did it start?"     no 8. LUNG  HISTORY: "Do you have any history of lung disease?"  (e.g., pulmonary embolus, asthma, emphysema)     Pulmonary HTN 10. OTHER SYMPTOMS: "Do you have any other symptoms?" (e.g., runny nose, wheezing, chest pain)  Protocols used: Cough - Acute Productive-A-AH

## 2022-03-24 NOTE — ED Triage Notes (Signed)
Pt via pov from home with hemoptysis x 3 days. She states she has chronic cough as well as frequent nosebleeds. She states that sometimes it is all blood; other times it is blood mixed with mucus. Pt states she had a needle biopsy a couple of weeks ago. Pt alert & oriented, nad noted.

## 2022-03-25 DIAGNOSIS — N186 End stage renal disease: Secondary | ICD-10-CM | POA: Diagnosis not present

## 2022-03-25 DIAGNOSIS — D509 Iron deficiency anemia, unspecified: Secondary | ICD-10-CM | POA: Diagnosis not present

## 2022-03-25 DIAGNOSIS — R053 Chronic cough: Secondary | ICD-10-CM | POA: Diagnosis not present

## 2022-03-25 DIAGNOSIS — D631 Anemia in chronic kidney disease: Secondary | ICD-10-CM | POA: Diagnosis not present

## 2022-03-25 DIAGNOSIS — R040 Epistaxis: Secondary | ICD-10-CM | POA: Diagnosis not present

## 2022-03-25 DIAGNOSIS — Z992 Dependence on renal dialysis: Secondary | ICD-10-CM | POA: Diagnosis not present

## 2022-03-25 DIAGNOSIS — N2581 Secondary hyperparathyroidism of renal origin: Secondary | ICD-10-CM | POA: Diagnosis not present

## 2022-03-27 DIAGNOSIS — D509 Iron deficiency anemia, unspecified: Secondary | ICD-10-CM | POA: Diagnosis not present

## 2022-03-27 DIAGNOSIS — D631 Anemia in chronic kidney disease: Secondary | ICD-10-CM | POA: Diagnosis not present

## 2022-03-27 DIAGNOSIS — Z992 Dependence on renal dialysis: Secondary | ICD-10-CM | POA: Diagnosis not present

## 2022-03-27 DIAGNOSIS — N186 End stage renal disease: Secondary | ICD-10-CM | POA: Diagnosis not present

## 2022-03-27 DIAGNOSIS — N2581 Secondary hyperparathyroidism of renal origin: Secondary | ICD-10-CM | POA: Diagnosis not present

## 2022-03-30 ENCOUNTER — Ambulatory Visit: Payer: Medicare Other | Admitting: Physician Assistant

## 2022-03-30 ENCOUNTER — Other Ambulatory Visit: Payer: Medicare Other

## 2022-03-30 DIAGNOSIS — N2581 Secondary hyperparathyroidism of renal origin: Secondary | ICD-10-CM | POA: Diagnosis not present

## 2022-03-30 DIAGNOSIS — D509 Iron deficiency anemia, unspecified: Secondary | ICD-10-CM | POA: Diagnosis not present

## 2022-03-30 DIAGNOSIS — D631 Anemia in chronic kidney disease: Secondary | ICD-10-CM | POA: Diagnosis not present

## 2022-03-30 DIAGNOSIS — N186 End stage renal disease: Secondary | ICD-10-CM | POA: Diagnosis not present

## 2022-03-30 DIAGNOSIS — Z992 Dependence on renal dialysis: Secondary | ICD-10-CM | POA: Diagnosis not present

## 2022-03-31 DIAGNOSIS — K766 Portal hypertension: Secondary | ICD-10-CM | POA: Diagnosis not present

## 2022-03-31 DIAGNOSIS — K6389 Other specified diseases of intestine: Secondary | ICD-10-CM | POA: Diagnosis not present

## 2022-03-31 DIAGNOSIS — K209 Esophagitis, unspecified without bleeding: Secondary | ICD-10-CM | POA: Diagnosis not present

## 2022-03-31 DIAGNOSIS — K449 Diaphragmatic hernia without obstruction or gangrene: Secondary | ICD-10-CM | POA: Diagnosis not present

## 2022-03-31 DIAGNOSIS — K3189 Other diseases of stomach and duodenum: Secondary | ICD-10-CM | POA: Diagnosis not present

## 2022-03-31 DIAGNOSIS — I8510 Secondary esophageal varices without bleeding: Secondary | ICD-10-CM | POA: Diagnosis not present

## 2022-03-31 DIAGNOSIS — K208 Other esophagitis without bleeding: Secondary | ICD-10-CM | POA: Diagnosis not present

## 2022-03-31 DIAGNOSIS — K259 Gastric ulcer, unspecified as acute or chronic, without hemorrhage or perforation: Secondary | ICD-10-CM | POA: Diagnosis not present

## 2022-03-31 DIAGNOSIS — K7460 Unspecified cirrhosis of liver: Secondary | ICD-10-CM | POA: Diagnosis not present

## 2022-04-01 DIAGNOSIS — N2581 Secondary hyperparathyroidism of renal origin: Secondary | ICD-10-CM | POA: Diagnosis not present

## 2022-04-01 DIAGNOSIS — D631 Anemia in chronic kidney disease: Secondary | ICD-10-CM | POA: Diagnosis not present

## 2022-04-01 DIAGNOSIS — D509 Iron deficiency anemia, unspecified: Secondary | ICD-10-CM | POA: Diagnosis not present

## 2022-04-01 DIAGNOSIS — Z992 Dependence on renal dialysis: Secondary | ICD-10-CM | POA: Diagnosis not present

## 2022-04-01 DIAGNOSIS — N186 End stage renal disease: Secondary | ICD-10-CM | POA: Diagnosis not present

## 2022-04-03 DIAGNOSIS — N2581 Secondary hyperparathyroidism of renal origin: Secondary | ICD-10-CM | POA: Diagnosis not present

## 2022-04-03 DIAGNOSIS — D631 Anemia in chronic kidney disease: Secondary | ICD-10-CM | POA: Diagnosis not present

## 2022-04-03 DIAGNOSIS — D509 Iron deficiency anemia, unspecified: Secondary | ICD-10-CM | POA: Diagnosis not present

## 2022-04-03 DIAGNOSIS — Z992 Dependence on renal dialysis: Secondary | ICD-10-CM | POA: Diagnosis not present

## 2022-04-03 DIAGNOSIS — N186 End stage renal disease: Secondary | ICD-10-CM | POA: Diagnosis not present

## 2022-04-06 DIAGNOSIS — D631 Anemia in chronic kidney disease: Secondary | ICD-10-CM | POA: Diagnosis not present

## 2022-04-06 DIAGNOSIS — N2581 Secondary hyperparathyroidism of renal origin: Secondary | ICD-10-CM | POA: Diagnosis not present

## 2022-04-06 DIAGNOSIS — D509 Iron deficiency anemia, unspecified: Secondary | ICD-10-CM | POA: Diagnosis not present

## 2022-04-06 DIAGNOSIS — N186 End stage renal disease: Secondary | ICD-10-CM | POA: Diagnosis not present

## 2022-04-06 DIAGNOSIS — Z992 Dependence on renal dialysis: Secondary | ICD-10-CM | POA: Diagnosis not present

## 2022-04-07 ENCOUNTER — Encounter: Payer: Self-pay | Admitting: Emergency Medicine

## 2022-04-07 ENCOUNTER — Telehealth: Payer: Self-pay | Admitting: Emergency Medicine

## 2022-04-07 ENCOUNTER — Ambulatory Visit: Payer: Medicare Other | Admitting: Emergency Medicine

## 2022-04-07 VITALS — BP 118/68 | HR 68 | Temp 98.3°F | Ht 65.0 in | Wt 129.6 lb

## 2022-04-07 DIAGNOSIS — J301 Allergic rhinitis due to pollen: Secondary | ICD-10-CM | POA: Diagnosis not present

## 2022-04-07 DIAGNOSIS — J8410 Pulmonary fibrosis, unspecified: Secondary | ICD-10-CM

## 2022-04-07 DIAGNOSIS — M359 Systemic involvement of connective tissue, unspecified: Secondary | ICD-10-CM

## 2022-04-07 DIAGNOSIS — I2721 Secondary pulmonary arterial hypertension: Secondary | ICD-10-CM | POA: Diagnosis not present

## 2022-04-07 DIAGNOSIS — J309 Allergic rhinitis, unspecified: Secondary | ICD-10-CM | POA: Insufficient documentation

## 2022-04-07 NOTE — Assessment & Plan Note (Signed)
Please increase your fluticasone nasal spray to 2 sprays each nostril twice daily. Start taking your Xyzal once daily every day on a schedule Continue nasal saline when you need it. Keep albuterol available to use 2 puffs up to every 4 hours if needed for shortness of breath, chest tightness, wheezing.

## 2022-04-07 NOTE — Telephone Encounter (Signed)
ATC X1 LVM for patient to call the office back. Need to know who Dr. Tamala Julian is with. Please advise patient notes will be faxed once Dr. Lamonte Sakai has signed todays encounter

## 2022-04-07 NOTE — Assessment & Plan Note (Signed)
Continue to follow with cardiology regarding your pulmonary hypertension Continue your oxygen at 2-4 L/min depending on your level of exertion and whether you are using your portable oxygen concentrator.  Our goal is to keep your saturation > 90%.

## 2022-04-07 NOTE — Telephone Encounter (Signed)
PT would like todays appt notes sent to her PCP, Dr. Tamala Julian. Adv I woul dlet

## 2022-04-07 NOTE — Patient Instructions (Signed)
Reviewed your CT scan of the chest today.  There is no evidence of any evolving inflammation or scarring present.  This is good news.  We will plan to follow annually Please increase your fluticasone nasal spray to 2 sprays each nostril twice daily. Start taking your Xyzal once daily every day on a schedule Continue nasal saline when you need it. Keep albuterol available to use 2 puffs up to every 4 hours if needed for shortness of breath, chest tightness, wheezing.  Continue to follow with cardiology regarding your pulmonary hypertension Continue your oxygen at 2-4 L/min depending on your level of exertion and whether you are using your portable oxygen concentrator.  Our goal is to keep your saturation > 90%. Follow with Dr Lamonte Sakai in 6 months or sooner if you have any problems

## 2022-04-07 NOTE — Progress Notes (Signed)
Subjective:    Patient ID: Katie Nunez, female    DOB: 1955-07-19, 67 y.o.   MRN: [id number]  Cough Pertinent negatives include no ear pain, eye redness, fever, headaches, postnasal drip, rash, rhinorrhea, sore throat, shortness of breath or wheezing.   ROV 11/18/21 --67 year old woman with a history of connective tissue disease, RA, scleroderma, Sjogren's and associated Raynaud's phenomenon.  She has associated pulmonary hypertension.  Also end-stage renal disease on hemodialysis and remote ITP, CVA, portal vein thrombosis.  Her PAH is followed at [facility], currently on selexipag, Ambrisentan.  I saw her in July for chronic cough.  I added nasal steroid to Atrovent nasal spray, Zyrtec.  We also arrange for a CT scan of the chest to ensure no evolving interstitial lung disease. She has O2 that she usually uses but not always w exertion. She continues to have mucous and drainage, associated cough. She has flonase, has intermittently used astelin and atrovent NS. Continues to have cough. She does [place] occasionally. Not currently on any anti-inflammatory meds.   CT chest 09/25/2021 reviewed by me, shows some bilateral basilar bandlike scarring that appears to be increased especially on the left.  There is a background of fine centrilobular nodular disease and mild diffuse bilateral bronchial wall thickening   ROV 04/07/2022 --follow-up visit for 67 year old woman with a history of RA and scleroderma, Raynaud's phenomenon.  She has pulmonary hypertension associated with her connective tissue disease, also end-stage renal disease on hemodialysis.  Past medical history significant for remote ITP, CVA, portal vein thrombosis Her PAH is followed at [facility] and she is on Ambrisentan and selexipag. Astelin nasal spray as needed, Tessalon Perles, Pepcid, Flonase nasal spray daily, Xyzal as needed, nasal saline She reports a lot of nasal gtt and cough. She has a lot of fatigue, exertional SOB. Her O2 is at 2-3L/min,  4L/min on POC  High-resolution CT scan of the chest done 03/19/2022 reviewed by me, shows some unchanged atelectasis and scarring to bilateral lung bases with some diffuse bilateral bronchial wall thickening and fine centrilobular nodularity.  No evidence of progression or overt interstitial lung disease.   Review of Systems  Constitutional:  Negative for fever and unexpected weight change.  HENT:  Positive for congestion and sneezing. Negative for dental problem, ear pain, nosebleeds, postnasal drip, rhinorrhea, sinus pressure, sore throat and trouble swallowing.   Eyes:  Negative for redness and itching.  Respiratory:  Positive for cough. Negative for chest tightness, shortness of breath and wheezing.   Cardiovascular:  Negative for palpitations and leg swelling.  Gastrointestinal:  Negative for nausea and vomiting.  Genitourinary:  Negative for dysuria.  Musculoskeletal:  Negative for joint swelling.  Skin:  Negative for rash.  Neurological:  Negative for headaches.  Hematological:  Does not bruise/bleed easily.  Psychiatric/Behavioral:  Negative for dysphoric mood. The patient is not nervous/anxious.         Objective:   Physical Exam Vitals:   04/07/22 1108  BP: 118/68  Pulse: 68  Temp: 98.3 F (36.8 C)  TempSrc: Oral  SpO2: 98%  Weight: 129 lb 9.6 oz (58.8 kg)  Height: 5\' 5"  (1.651 m)   Gen: Pleasant, well-nourished, in no distress,  normal affect  ENT: No lesions,  mouth clear,  oropharynx clear, no postnasal drip  Neck: No JVD, no stridor  Lungs: No use of accessory muscles, decreased at bases, no wheeze or crackles.   Cardiovascular: RRR, heart sounds normal, no murmur or gallops, no peripheral edema  Musculoskeletal:  No deformities, no cyanosis or clubbing  Neuro: alert, non focal  Skin: Warm, no lesions or rash     Assessment & Plan:  Postinflammatory pulmonary fibrosis (HCC) Stable mild scar, no evidence progressive ILD  Reviewed your CT scan of the  chest today.  There is no evidence of any evolving inflammation or scarring present.  This is good news.  We will plan to follow annually Follow with Dr Lamonte Sakai in 6 months or sooner if you have any problems  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 ([place])  Continue to follow with cardiology regarding your pulmonary hypertension Continue your oxygen at 2-4 L/min depending on your level of exertion and whether you are using your portable oxygen concentrator.  Our goal is to keep your saturation > 90%.  Allergic rhinitis Please increase your fluticasone nasal spray to 2 sprays each nostril twice daily. Start taking your Xyzal once daily every day on a schedule Continue nasal saline when you need it. Keep albuterol available to use 2 puffs up to every 4 hours if needed for shortness of breath, chest tightness, wheezing.  Baltazar Apo, MD, PhD 04/07/2022, 12:42 PM [facility] [phone] or if no answer [phone]

## 2022-04-07 NOTE — Assessment & Plan Note (Signed)
Stable mild scar, no evidence progressive ILD  Reviewed your CT scan of the chest today.  There is no evidence of any evolving inflammation or scarring present.  This is good news.  We will plan to follow annually Follow with Dr Lamonte Sakai in 6 months or sooner if you have any problems

## 2022-04-08 DIAGNOSIS — N186 End stage renal disease: Secondary | ICD-10-CM | POA: Diagnosis not present

## 2022-04-08 DIAGNOSIS — D631 Anemia in chronic kidney disease: Secondary | ICD-10-CM | POA: Diagnosis not present

## 2022-04-08 DIAGNOSIS — Z992 Dependence on renal dialysis: Secondary | ICD-10-CM | POA: Diagnosis not present

## 2022-04-08 DIAGNOSIS — D509 Iron deficiency anemia, unspecified: Secondary | ICD-10-CM | POA: Diagnosis not present

## 2022-04-08 DIAGNOSIS — N2581 Secondary hyperparathyroidism of renal origin: Secondary | ICD-10-CM | POA: Diagnosis not present

## 2022-04-10 DIAGNOSIS — Z992 Dependence on renal dialysis: Secondary | ICD-10-CM | POA: Diagnosis not present

## 2022-04-10 DIAGNOSIS — N2581 Secondary hyperparathyroidism of renal origin: Secondary | ICD-10-CM | POA: Diagnosis not present

## 2022-04-10 DIAGNOSIS — N186 End stage renal disease: Secondary | ICD-10-CM | POA: Diagnosis not present

## 2022-04-10 DIAGNOSIS — D509 Iron deficiency anemia, unspecified: Secondary | ICD-10-CM | POA: Diagnosis not present

## 2022-04-10 DIAGNOSIS — D631 Anemia in chronic kidney disease: Secondary | ICD-10-CM | POA: Diagnosis not present

## 2022-04-11 ENCOUNTER — Other Ambulatory Visit: Payer: Self-pay | Admitting: Hematology and Oncology

## 2022-04-11 DIAGNOSIS — N186 End stage renal disease: Secondary | ICD-10-CM | POA: Diagnosis not present

## 2022-04-11 DIAGNOSIS — Z992 Dependence on renal dialysis: Secondary | ICD-10-CM | POA: Diagnosis not present

## 2022-04-11 DIAGNOSIS — D693 Immune thrombocytopenic purpura: Secondary | ICD-10-CM

## 2022-04-11 DIAGNOSIS — N269 Renal sclerosis, unspecified: Secondary | ICD-10-CM | POA: Diagnosis not present

## 2022-04-11 NOTE — Progress Notes (Unsigned)
[place] [phone]   [phone]  PROGRESS NOTE  Patient Care Team: [name], MD as PCP - General Beryle Beams Alyson Locket, MD as Consulting Physician (Oncology) Early, Arvilla Meres, MD as Consulting Physician (Vascular Surgery) [facility], Hassell Done, MD as Consulting Physician (Nephrology) Leavy Cella, MD as Referring Physician (Internal Medicine) Rich Reining, MD as Consulting Physician (Cardiology)  Hematological/Oncological History # ITP 05/2017: treated with steroid taper and rituximab with Dr. Beryle Beams 11/18/2021: last visit with Dr. Alen Blew 04/12/2022: transfer care to Dr. Lorenso Courier   Interval History:  Katie Nunez 67 y.o. female with medical history significant for ITP who presents for a follow up visit. The patient's last visit was on 11/18/2021 with Dr. Alen Blew. In the interim since the last visit ***  On exam today Katie Nunez ***  MEDICAL HISTORY:  Past Medical History:  Diagnosis Date   Achalasia    Anxiety    Dysplasia of cervix, low grade (CIN 1)    Environmental allergies    "all year long" (12/27/2016)   ESRD (end stage renal disease) on dialysis ([place])    "TTS; [place]" (12/27/2016)   Fibromyalgia    GERD (gastroesophageal reflux disease)    Gout    Hypertension    IBS (irritable bowel syndrome)    MVP (mitral valve prolapse)    RA (rheumatoid arthritis) ([place])    FOLLOWED BY DR. SHANAHAN   Raynaud's disease    Scleroderma ([place])    Seasonal allergies    Thrombocytopenia ([place]) 07/01/2016   Acute fall to 13,000 07/01/16   Tubular adenoma 01/08/2008   CECUM   Vitamin D deficiency     SURGICAL HISTORY: Past Surgical History:  Procedure Laterality Date   ANKLE FRACTURE SURGERY Right    AV FISTULA PLACEMENT Left 06/28/2016   Procedure: left arm ARTERIOVENOUS (AV) FISTULA CREATION;  Surgeon: Rosetta Posner, MD;  Location: [facility] OR;  Service: Vascular;  Laterality: Left;   [place] Left 09/27/2016   Procedure: LEFT UPPER ARM CEPHALIC VEIN TRANSPOSITION;  Surgeon: Rosetta Posner, MD;  Location: [facility] OR;  Service: Vascular;  Laterality: Left;   BREAST BIOPSY     "? side"   [place]     COLONOSCOPY W/ BIOPSIES  01/08/2008   INSERTION OF DIALYSIS CATHETER Right 06/28/2016   Procedure: INSERTION OF DIALYSIS CATHETER, right internal jugular;  Surgeon: Rosetta Posner, MD;  Location: [facility] OR;  Service: Vascular;  Laterality: Right;   MYOMECTOMY     NASAL ENDOSCOPY WITH EPISTAXIS CONTROL N/A 12/29/2019   Procedure: NASAL ENDOSCOPY WITH EPISTAXIS CONTROL;  Surgeon: Leta Baptist, MD;  Location: [place];  Service: ENT;  Laterality: N/A;   PELVIC LAPAROSCOPY  2011   superficial thrombophlebitis Left 07-2014    SOCIAL HISTORY: Social History   Socioeconomic History   Marital status: Married    Spouse name: Not on file   Number of children: Not on file   Years of education: Not on file   Highest education level: Not on file  Occupational History   Not on file  Tobacco Use   Smoking status: Never   Smokeless tobacco: Never  Vaping Use   Vaping Use: Never used  Substance and Sexual Activity   Alcohol use: No   Drug use: No   Sexual activity: Not Currently    Birth control/protection: Post-menopausal  Other Topics Concern   Not on file  Social History  Narrative   Lives in [place]   Social Determinants of Health   Financial Resource Strain: Not on file  Food Insecurity: Not on file  Transportation Needs: Not on file  Physical Activity: Not on file  Stress: Not on file  Social Connections: Not on file  Intimate Partner Violence: Not on file    FAMILY HISTORY: Family History  Problem Relation Age of Onset   Hypertension Mother    Diabetes Mother    Heart disease Father    Hypertension Maternal Aunt    Diabetes Maternal Grandmother    Heart disease Paternal Grandfather    Cerebral palsy Cousin        1ST COUSIN?    Diabetes Paternal Grandmother     ALLERGIES:  is allergic to other, savella [milnacipran hcl], and tape.  MEDICATIONS:  Current Outpatient Medications  Medication Sig Dispense Refill   albuterol (VENTOLIN HFA) 108 (90 Base) MCG/ACT inhaler Inhale 2 puffs into the lungs every 6 (six) hours as needed for wheezing or shortness of breath. 8 g 6   ambrisentan (LETAIRIS) 5 MG tablet Take 10 mg by mouth daily.     aspirin EC 81 MG tablet Take 1 tablet (81 mg total) by mouth daily. Swallow whole. 30 tablet 11   atorvastatin (LIPITOR) 80 MG tablet Take 1 tablet (80 mg total) by mouth daily. 90 tablet 3   azelastine (ASTELIN) 0.1 % nasal spray Place 1 spray into both nostrils 2 (two) times daily as needed for rhinitis. Use in each nostril as directed 30 mL 12   benzonatate (TESSALON) 100 MG capsule Take 1 capsule (100 mg total) by mouth every 8 (eight) hours. 21 capsule 0   calcium acetate (PHOSLO) 667 MG capsule Take by mouth.     camphor-menthol (SARNA) lotion Apply topically 2 (two) times daily. (Patient taking differently: Apply topically as needed.) 222 mL 0   clonazePAM (KLONOPIN) 0.5 MG tablet Take 1 tablet (0.5 mg total) by mouth 2 (two) times daily as needed for anxiety. 30 tablet 0   co-enzyme Q-10 30 MG capsule Take 7 capsules (210 mg total) by mouth 3 (three) times daily. 1 capsule 1   cyclobenzaprine (FLEXERIL) 5 MG tablet Take 1 tablet (5 mg total) by mouth every 8 (eight) hours as needed for muscle spasms. 30 tablet 0   docusate sodium (COLACE) 100 MG capsule Take 2 capsules (200 mg total) by mouth daily. (Patient taking differently: Take 200 mg by mouth as needed.) 60 capsule 0   doxycycline (VIBRA-TABS) 100 MG tablet Take 100 mg by mouth 2 (two) times daily.     famotidine (PEPCID) 20 MG tablet Take 1 tablet (20 mg total) by mouth daily as needed for heartburn or indigestion. (Patient taking differently: Take 40 mg by mouth 2 (two) times daily.) 30 tablet 0   fluticasone (FLONASE) 50  MCG/ACT nasal spray Place 1 spray into both nostrils daily for 7 days. 15.8 mL 0   gabapentin (NEURONTIN) 100 MG capsule Take 1 capsule (100 mg total) by mouth 3 (three) times daily. Take 1 capsule in morning and two at night 90 capsule 0   hydrocerin (EUCERIN) CREA Apply 1 application topically 2 (two) times daily. To dry skin--avoid IV site 454 g 0   hydrOXYzine (ATARAX/VISTARIL) 25 MG tablet Take 25 mg by mouth every 8 (eight) hours as needed for itching.      levocetirizine (XYZAL) 5 MG tablet SMARTSIG:1 Tablet(s) By Mouth Every Evening     lidocaine-prilocaine (EMLA) cream  Apply 1 application topically as needed. Apply to toes as needed for pain 30 g 3   lubiprostone (AMITIZA) 8 MCG capsule Take 8 mcg by mouth 2 (two) times daily with a meal.     multivitamin (RENA-VIT) TABS tablet Take 1 tablet by mouth daily at 6 (six) AM. 30 tablet 0   nitroGLYCERIN (NITRO-DUR) 0.2 mg/hr patch Place 1 patch (0.2 mg total) onto the skin daily. Apply near great toes bilateral to top of foot every morning.  May remove at night 30 patch 4   oxymetazoline (AFRIN) 0.05 % nasal spray Place 1 spray into both nostrils 2 (two) times daily as needed for congestion. 30 mL 0   Selexipag (UPTRAVI) 800 MCG TABS Take 1 tablet (800 mcg total) by mouth in the morning and at bedtime. 60 tablet 0   sodium chloride (OCEAN) 0.65 % SOLN nasal spray Place 1 spray into both nostrils 5 (five) times daily. At least 5 times a day  0   sodium chloride (OCEAN) 0.65 % SOLN nasal spray Place 1 spray into both nostrils as needed for congestion. 44 mL 0   No current facility-administered medications for this visit.    REVIEW OF SYSTEMS:   Constitutional: ( - ) fevers, ( - )  chills , ( - ) night sweats Eyes: ( - ) blurriness of vision, ( - ) double vision, ( - ) watery eyes Ears, nose, mouth, throat, and face: ( - ) mucositis, ( - ) sore throat Respiratory: ( - ) cough, ( - ) dyspnea, ( - ) wheezes Cardiovascular: ( - ) palpitation, ( -  ) chest discomfort, ( - ) lower extremity swelling Gastrointestinal:  ( - ) nausea, ( - ) heartburn, ( - ) change in bowel habits Skin: ( - ) abnormal skin rashes Lymphatics: ( - ) new lymphadenopathy, ( - ) easy bruising Neurological: ( - ) numbness, ( - ) tingling, ( - ) new weaknesses Behavioral/Psych: ( - ) mood change, ( - ) new changes  All other systems were reviewed with the patient and are negative.  PHYSICAL EXAMINATION: ECOG PERFORMANCE STATUS: {CHL ONC ECOG [phone]}  There were no vitals filed for this visit. There were no vitals filed for this visit.  GENERAL: alert, no distress and comfortable SKIN: skin color, texture, turgor are normal, no rashes or significant lesions EYES: conjunctiva are pink and non-injected, sclera clear OROPHARYNX: no exudate, no erythema; lips, buccal mucosa, and tongue normal  NECK: supple, non-tender LYMPH:  no palpable lymphadenopathy in the cervical, axillary or inguinal LUNGS: clear to auscultation and percussion with normal breathing effort HEART: regular rate & rhythm and no murmurs and no lower extremity edema ABDOMEN: soft, non-tender, non-distended, normal bowel sounds Musculoskeletal: no cyanosis of digits and no clubbing  PSYCH: alert & oriented x 3, fluent speech NEURO: no focal motor/sensory deficits  LABORATORY DATA:  I have reviewed the data as listed    Latest Ref Rng & Units 03/24/2022    2:50 PM 11/18/2021    8:55 AM 01/22/2020    2:20 PM  CBC  WBC 4.0 - 10.5 K/uL 2.9  2.7  3.5   Hemoglobin 12.0 - 15.0 g/dL 9.2  10.3  10.5   Hematocrit 36.0 - 46.0 % 29.4  32.2  32.3   Platelets 150 - 400 K/uL 143  165  189        Latest Ref Rng & Units 03/24/2022    2:50 PM 06/10/2021    4:35  PM 01/22/2020    2:20 PM  CMP  Glucose 70 - 99 mg/dL 81  98  114   BUN 8 - 23 mg/dL 52  39  60   Creatinine 0.44 - 1.00 mg/dL 8.05  7.77  7.96   Sodium 135 - 145 mmol/L 139  140  131   Potassium 3.5 - 5.1 mmol/L 5.1  4.0  3.8    Chloride 98 - 111 mmol/L 101  93  92   CO2 22 - 32 mmol/L 25  28  23    Calcium 8.9 - 10.3 mg/dL 10.1  9.9  9.4   Total Protein 6.5 - 8.1 g/dL 7.0  7.5    Total Bilirubin 0.3 - 1.2 mg/dL 0.4  0.3    Alkaline Phos 38 - 126 U/L 72  108    AST 15 - 41 U/L 29  21    ALT 0 - 44 U/L 15  16      No results found for: "MPROTEIN" No results found for: "KPAFRELGTCHN", "LAMBDASER", "KAPLAMBRATIO"   BLOOD FILM: *** Review of the peripheral blood smear showed normal appearing white cells with neutrophils that were appropriately lobated and granulated. There was no predominance of bi-lobed or hyper-segmented neutrophils appreciated. No Dohle bodies were noted. There was no left shifting, immature forms or blasts noted. Lymphocytes remain normal in size without any predominance of large granular lymphocytes. Red cells show no anisopoikilocytosis, macrocytes , microcytes or polychromasia. There were no schistocytes, target cells, echinocytes, acanthocytes, dacrocytes, or stomatocytes.There was no rouleaux formation, nucleated red cells, or intra-cellular inclusions noted. The platelets are normal in size, shape, and color without any clumping evident.  RADIOGRAPHIC STUDIES: I have personally reviewed the radiological images as listed and agreed with the findings in the report. DG Chest 2 View  Result Date: 03/24/2022 CLINICAL DATA:  Cough EXAM: CHEST - 2 VIEW COMPARISON:  CXR 12/15/19 FINDINGS: No pleural effusion. No pneumothorax. Unchanged cardiac and mediastinal contours. There are bibasilar linear airspace opacities, which are favored to represent atelectasis. No radiographically apparent displaced rib fractures. Visualized upper abdomen is unremarkable. Vertebral body heights maintained. IMPRESSION: There are bibasilar linear airspace opacities, which are favored to represent atelectasis. Electronically Signed   By: Marin Roberts M.D.   On: 03/24/2022 16:49   CT Chest High Resolution  Result Date:  03/20/2022 CLINICAL DATA:  Interstitial lung disease EXAM: CT CHEST WITHOUT CONTRAST TECHNIQUE: Multidetector CT imaging of the chest was performed following the standard protocol without intravenous contrast. High resolution imaging of the lungs, as well as inspiratory and expiratory imaging, was performed. RADIATION DOSE REDUCTION: This exam was performed according to the departmental dose-optimization program which includes automated exposure control, adjustment of the mA and/or kV according to patient size and/or use of iterative reconstruction technique. COMPARISON:  09/25/2021 FINDINGS: Cardiovascular: Aortic atherosclerosis. Cardiomegaly. Left coronary artery calcifications. Aortic valve calcifications. No pericardial effusion. Mediastinum/Nodes: No enlarged mediastinal, hilar, or axillary lymph nodes. Thyroid gland, trachea, and esophagus demonstrate no significant findings. Lungs/Pleura: No evidence of fibrotic interstitial lung disease. Unchanged, bland appearing scarring and or atelectasis of the bilateral lung bases. Unchanged mild, diffuse bilateral bronchial wall thickening and background of very fine centrilobular nodularity, concentrated in the lung apices. No significant air trapping on expiratory phase imaging. No pleural effusion or pneumothorax. Upper Abdomen: Coarse, nodular contour of the liver in the included upper abdomen. Small volume perihepatic ascites. Musculoskeletal: Unchanged calcified bilateral breast lesions, again presumed calcified fibroadenomata. No acute osseous findings. Renal osteodystrophy. IMPRESSION:  1. No evidence of fibrotic interstitial lung disease. Unchanged, bland appearing scarring and or atelectasis of the bilateral lung bases, again most suggestive of sequelae of prior infection or aspiration. 2. Unchanged mild, diffuse bilateral bronchial wall thickening and background of very fine centrilobular nodularity, concentrated in the lung apices. Findings are consistent  with nonspecific infectious or inflammatory bronchiolitis but are most commonly seen in smoking-related respiratory bronchiolitis. As previously noted, patient is reportedly a nonsmoker and differential considerations generally include hypersensitivity pneumonitis and occupational pneumoconiosis. 3. Cardiomegaly and coronary artery disease. 4. Aortic valve calcifications. Correlate for echocardiographic evidence of aortic valve dysfunction. 5. Cirrhotic morphology of the liver in the included upper abdomen small volume ascites. Aortic Atherosclerosis (ICD10-I70.0). Electronically Signed   By: Delanna Ahmadi M.D.   On: 03/20/2022 17:27   XR Cervical Spine 2 or 3 views  Result Date: 03/18/2022 AP lateral cervical spine images are obtained and reviewed this shows reversal of normal cervical curvature.  Disc space height is maintained no subluxation.  Uncovertebral joints are normal. Impression: Cervical spine radiographs negative for acute or chronic degenerative changes.  Nonspecific reversal of curvature.  XR Shoulder Right  Result Date: 03/18/2022 Multiple views right shoulder obtained and reviewed.  No glenohumeral subluxation.  Glenohumeral joint shows no degenerative changes acromioclavicular joint is normal.  Some calcified granulomas in the lung field. Impression: Right shoulder negative for acute bone changes or chronic degenerative changes.   ASSESSMENT & PLAN Katie Nunez 67 y.o. female with medical history significant for ITP who presents for a follow up visit.  # ITP -- Patient last required steroid therapy in May 2019. Also received rituximab at that time. -- Labs on 03/24/2022 show white blood cell count 2.9, hemoglobin 9.2, and platelets of 143  # Leukopenia -- Stable over last several years, likely secondary to cirrhosis. -- Cirrhosis and ITP are likely reasons why platelets are mildly decreased.  # Normocytic anemia -- Etiology is unclear.  Will need to perform full nutritional  workup to include vitamin B12, folate, and copper levels. -- Will additionally order LDH and reticulocyte panel -- Will rule out monoclonal gammopathy with SPEP and serum free light chains -- If no clear etiology can be found may need to consider bone marrow biopsy.  No orders of the defined types were placed in this encounter.   All questions were answered. The patient knows to call the clinic with any problems, questions or concerns.  A total of more than 40 minutes were spent on this encounter with face-to-face time and non-face-to-face time, including preparing to see the patient, ordering tests and/or medications, counseling the patient and coordination of care as outlined above.   Ledell Peoples, MD Department of Hematology/Oncology [place] at [facility] Phone: [phone] Pager: [phone] Email: [email or web address]  04/11/2022 1:24 PM

## 2022-04-12 ENCOUNTER — Inpatient Hospital Stay: Payer: Medicare Other | Attending: Nurse Practitioner

## 2022-04-12 ENCOUNTER — Inpatient Hospital Stay: Payer: Medicare Other | Admitting: Hematology and Oncology

## 2022-04-12 ENCOUNTER — Other Ambulatory Visit: Payer: Self-pay

## 2022-04-12 ENCOUNTER — Telehealth: Payer: Self-pay | Admitting: *Deleted

## 2022-04-12 VITALS — BP 122/66 | HR 92 | Temp 99.0°F | Resp 19 | Wt 131.4 lb

## 2022-04-12 DIAGNOSIS — D693 Immune thrombocytopenic purpura: Secondary | ICD-10-CM | POA: Diagnosis not present

## 2022-04-12 DIAGNOSIS — I272 Pulmonary hypertension, unspecified: Secondary | ICD-10-CM | POA: Diagnosis not present

## 2022-04-12 DIAGNOSIS — D72819 Decreased white blood cell count, unspecified: Secondary | ICD-10-CM | POA: Diagnosis not present

## 2022-04-12 DIAGNOSIS — N186 End stage renal disease: Secondary | ICD-10-CM | POA: Diagnosis not present

## 2022-04-12 DIAGNOSIS — I120 Hypertensive chronic kidney disease with stage 5 chronic kidney disease or end stage renal disease: Secondary | ICD-10-CM | POA: Diagnosis not present

## 2022-04-12 DIAGNOSIS — K7460 Unspecified cirrhosis of liver: Secondary | ICD-10-CM | POA: Diagnosis not present

## 2022-04-12 DIAGNOSIS — Z992 Dependence on renal dialysis: Secondary | ICD-10-CM | POA: Diagnosis not present

## 2022-04-12 DIAGNOSIS — I7300 Raynaud's syndrome without gangrene: Secondary | ICD-10-CM | POA: Diagnosis not present

## 2022-04-12 DIAGNOSIS — D631 Anemia in chronic kidney disease: Secondary | ICD-10-CM | POA: Insufficient documentation

## 2022-04-12 DIAGNOSIS — M3500 Sicca syndrome, unspecified: Secondary | ICD-10-CM | POA: Diagnosis not present

## 2022-04-12 DIAGNOSIS — M069 Rheumatoid arthritis, unspecified: Secondary | ICD-10-CM | POA: Insufficient documentation

## 2022-04-12 LAB — CBC WITH DIFFERENTIAL (CANCER CENTER ONLY)
Abs Immature Granulocytes: 0 10*3/uL (ref 0.00–0.07)
Basophils Absolute: 0 10*3/uL (ref 0.0–0.1)
Basophils Relative: 1 %
Eosinophils Absolute: 0 10*3/uL (ref 0.0–0.5)
Eosinophils Relative: 2 %
HCT: 29.2 % — ABNORMAL LOW (ref 36.0–46.0)
Hemoglobin: 9.6 g/dL — ABNORMAL LOW (ref 12.0–15.0)
Immature Granulocytes: 0 %
Lymphocytes Relative: 24 %
Lymphs Abs: 0.6 10*3/uL — ABNORMAL LOW (ref 0.7–4.0)
MCH: 29.4 pg (ref 26.0–34.0)
MCHC: 32.9 g/dL (ref 30.0–36.0)
MCV: 89.3 fL (ref 80.0–100.0)
Monocytes Absolute: 0.3 10*3/uL (ref 0.1–1.0)
Monocytes Relative: 13 %
Neutro Abs: 1.5 10*3/uL — ABNORMAL LOW (ref 1.7–7.7)
Neutrophils Relative %: 60 %
Platelet Count: 138 10*3/uL — ABNORMAL LOW (ref 150–400)
RBC: 3.27 MIL/uL — ABNORMAL LOW (ref 3.87–5.11)
RDW: 17.2 % — ABNORMAL HIGH (ref 11.5–15.5)
WBC Count: 2.5 10*3/uL — ABNORMAL LOW (ref 4.0–10.5)
nRBC: 0 % (ref 0.0–0.2)

## 2022-04-12 LAB — LACTATE DEHYDROGENASE: LDH: 168 U/L (ref 98–192)

## 2022-04-12 LAB — CMP (CANCER CENTER ONLY)
ALT: 12 U/L (ref 0–44)
AST: 17 U/L (ref 15–41)
Albumin: 3.7 g/dL (ref 3.5–5.0)
Alkaline Phosphatase: 80 U/L (ref 38–126)
Anion gap: 10 (ref 5–15)
BUN: 39 mg/dL — ABNORMAL HIGH (ref 8–23)
CO2: 30 mmol/L (ref 22–32)
Calcium: 9.8 mg/dL (ref 8.9–10.3)
Chloride: 101 mmol/L (ref 98–111)
Creatinine: 8.59 mg/dL (ref 0.44–1.00)
GFR, Estimated: 5 mL/min — ABNORMAL LOW (ref >60)
Glucose, Bld: 83 mg/dL (ref 70–99)
Potassium: 4.2 mmol/L (ref 3.5–5.1)
Sodium: 141 mmol/L (ref 135–145)
Total Bilirubin: 0.4 mg/dL (ref 0.3–1.2)
Total Protein: 7.2 g/dL (ref 6.5–8.1)

## 2022-04-12 LAB — RETIC PANEL
Immature Retic Fract: 10.4 % (ref 2.3–15.9)
RBC.: 3.33 MIL/uL — ABNORMAL LOW (ref 3.87–5.11)
Retic Count, Absolute: 26.6 10*3/uL (ref 19.0–186.0)
Retic Ct Pct: 0.8 % (ref 0.4–3.1)
Reticulocyte Hemoglobin: 28.9 pg (ref >27.9)

## 2022-04-12 LAB — IRON AND IRON BINDING CAPACITY (CC-WL,HP ONLY)
Iron: 43 ug/dL (ref 28–170)
Saturation Ratios: 12 % (ref 10.4–31.8)
TIBC: 356 ug/dL (ref 250–450)
UIBC: 313 ug/dL (ref 148–442)

## 2022-04-12 LAB — FOLATE: Folate: 4.3 ng/mL — ABNORMAL LOW (ref >5.9)

## 2022-04-12 LAB — VITAMIN B12: Vitamin B-12: 297 pg/mL (ref 180–914)

## 2022-04-12 LAB — FERRITIN: Ferritin: 70 ng/mL (ref 11–307)

## 2022-04-12 NOTE — Telephone Encounter (Signed)
CRITICAL VALUE STICKER  CRITICAL VALUE: Creatinine 8.59  RECEIVER (on-site recipient of call): Georgina Pillion, [place] NOTIFIED: 04/12/22; 10:49  MESSENGER (representative from lab):Heather  MD NOTIFIED: Dr. Lorenso Courier  TIME OF NOTIFICATION:04/12/22; 1055  RESPONSE: Information acknowledged/no new orders received

## 2022-04-13 DIAGNOSIS — Z992 Dependence on renal dialysis: Secondary | ICD-10-CM | POA: Diagnosis not present

## 2022-04-13 DIAGNOSIS — D509 Iron deficiency anemia, unspecified: Secondary | ICD-10-CM | POA: Diagnosis not present

## 2022-04-13 DIAGNOSIS — N2581 Secondary hyperparathyroidism of renal origin: Secondary | ICD-10-CM | POA: Diagnosis not present

## 2022-04-13 DIAGNOSIS — D631 Anemia in chronic kidney disease: Secondary | ICD-10-CM | POA: Diagnosis not present

## 2022-04-13 DIAGNOSIS — N186 End stage renal disease: Secondary | ICD-10-CM | POA: Diagnosis not present

## 2022-04-13 LAB — KAPPA/LAMBDA LIGHT CHAINS
Kappa free light chain: 385.8 mg/L — ABNORMAL HIGH (ref 3.3–19.4)
Kappa, lambda light chain ratio: 1.27 (ref 0.26–1.65)
Lambda free light chains: 303.3 mg/L — ABNORMAL HIGH (ref 5.7–26.3)

## 2022-04-14 NOTE — Telephone Encounter (Signed)
Notes have been faxed to [facility] family medicine. Closing encounter. NFN

## 2022-04-14 NOTE — Telephone Encounter (Signed)
[facility] @ [place], [place] [address] [phone]  Dr. [name] Fax [phone]  Please fax appt notes. TY

## 2022-04-15 DIAGNOSIS — Z992 Dependence on renal dialysis: Secondary | ICD-10-CM | POA: Diagnosis not present

## 2022-04-15 DIAGNOSIS — N186 End stage renal disease: Secondary | ICD-10-CM | POA: Diagnosis not present

## 2022-04-15 DIAGNOSIS — D631 Anemia in chronic kidney disease: Secondary | ICD-10-CM | POA: Diagnosis not present

## 2022-04-15 DIAGNOSIS — D509 Iron deficiency anemia, unspecified: Secondary | ICD-10-CM | POA: Diagnosis not present

## 2022-04-15 DIAGNOSIS — N2581 Secondary hyperparathyroidism of renal origin: Secondary | ICD-10-CM | POA: Diagnosis not present

## 2022-04-15 LAB — MULTIPLE MYELOMA PANEL, SERUM
Albumin SerPl Elph-Mcnc: 3.7 g/dL (ref 2.9–4.4)
Albumin/Glob SerPl: 1.3 (ref 0.7–1.7)
Alpha 1: 0.2 g/dL (ref 0.0–0.4)
Alpha2 Glob SerPl Elph-Mcnc: 0.6 g/dL (ref 0.4–1.0)
B-Globulin SerPl Elph-Mcnc: 0.9 g/dL (ref 0.7–1.3)
Gamma Glob SerPl Elph-Mcnc: 1.3 g/dL (ref 0.4–1.8)
Globulin, Total: 3 g/dL (ref 2.2–3.9)
IgA: 311 mg/dL (ref 87–352)
IgG (Immunoglobin G), Serum: 1347 mg/dL (ref 586–1602)
IgM (Immunoglobulin M), Srm: 91 mg/dL (ref 26–217)
Total Protein ELP: 6.7 g/dL (ref 6.0–8.5)

## 2022-04-17 DIAGNOSIS — N2581 Secondary hyperparathyroidism of renal origin: Secondary | ICD-10-CM | POA: Diagnosis not present

## 2022-04-17 DIAGNOSIS — Z992 Dependence on renal dialysis: Secondary | ICD-10-CM | POA: Diagnosis not present

## 2022-04-17 DIAGNOSIS — N186 End stage renal disease: Secondary | ICD-10-CM | POA: Diagnosis not present

## 2022-04-17 DIAGNOSIS — D509 Iron deficiency anemia, unspecified: Secondary | ICD-10-CM | POA: Diagnosis not present

## 2022-04-17 DIAGNOSIS — D631 Anemia in chronic kidney disease: Secondary | ICD-10-CM | POA: Diagnosis not present

## 2022-04-20 ENCOUNTER — Emergency Department (HOSPITAL_BASED_OUTPATIENT_CLINIC_OR_DEPARTMENT_OTHER)
Admission: EM | Admit: 2022-04-20 | Discharge: 2022-04-20 | Disposition: A | Discharge status: Home / Self Care | Payer: Medicare Other | Attending: Emergency Medicine | Admitting: Emergency Medicine

## 2022-04-20 ENCOUNTER — Other Ambulatory Visit: Payer: Self-pay

## 2022-04-20 ENCOUNTER — Telehealth: Payer: Self-pay | Admitting: *Deleted

## 2022-04-20 ENCOUNTER — Encounter (HOSPITAL_BASED_OUTPATIENT_CLINIC_OR_DEPARTMENT_OTHER): Payer: Self-pay | Admitting: Emergency Medicine

## 2022-04-20 DIAGNOSIS — Z992 Dependence on renal dialysis: Secondary | ICD-10-CM | POA: Diagnosis not present

## 2022-04-20 DIAGNOSIS — R0609 Other forms of dyspnea: Secondary | ICD-10-CM | POA: Diagnosis not present

## 2022-04-20 DIAGNOSIS — K5900 Constipation, unspecified: Secondary | ICD-10-CM | POA: Insufficient documentation

## 2022-04-20 DIAGNOSIS — I270 Primary pulmonary hypertension: Secondary | ICD-10-CM | POA: Diagnosis not present

## 2022-04-20 DIAGNOSIS — K6289 Other specified diseases of anus and rectum: Secondary | ICD-10-CM | POA: Insufficient documentation

## 2022-04-20 DIAGNOSIS — D631 Anemia in chronic kidney disease: Secondary | ICD-10-CM | POA: Diagnosis not present

## 2022-04-20 DIAGNOSIS — N186 End stage renal disease: Secondary | ICD-10-CM | POA: Diagnosis not present

## 2022-04-20 DIAGNOSIS — D509 Iron deficiency anemia, unspecified: Secondary | ICD-10-CM | POA: Diagnosis not present

## 2022-04-20 DIAGNOSIS — N2581 Secondary hyperparathyroidism of renal origin: Secondary | ICD-10-CM | POA: Diagnosis not present

## 2022-04-20 LAB — METHYLMALONIC ACID, SERUM: Methylmalonic Acid, Quantitative: 1060 nmol/L — ABNORMAL HIGH (ref 0–378)

## 2022-04-20 MED ORDER — FOLIC ACID 1 MG PO TABS: 1.0000 mg | ORAL_TABLET | Freq: Every day | ORAL | 2 refills | Status: DC

## 2022-04-20 MED ORDER — VITAMIN B-12 100 MCG PO TABS: 100.0000 ug | ORAL_TABLET | Freq: Every day | ORAL | 1 refills | Status: DC

## 2022-04-20 MED ORDER — HYDROCORTISONE (PERIANAL) 2.5 % EX CREA: 1.0000 | TOPICAL_CREAM | Freq: Two times a day (BID) | CUTANEOUS | 0 refills | Status: DC

## 2022-04-20 MED ORDER — LIDOCAINE HCL URETHRAL/MUCOSAL 2 % EX GEL
1.0000 | Freq: Once | CUTANEOUS | Status: AC
  Administered 2022-04-20: 1
  Filled 2022-04-20: qty 11

## 2022-04-20 NOTE — ED Notes (Signed)
Patient on chronic oxygen. Offered patient hospital tank. Patient opted to stay on home concentrator. Patient informed to notify staff if she would like to be placed on hospital tank

## 2022-04-20 NOTE — ED Triage Notes (Signed)
Constipation and pain in rectum.  Reports needing to push to go and today pain was intense

## 2022-04-20 NOTE — Telephone Encounter (Signed)
-----   Message from Jaci Standard, MD sent at 04/20/2022  9:36 AM EDT ----- Please let Katie Nunez know that her labs show concern for low folate and vitamin b12. Please call in vitamin b12 1000 mcg PO daily and folic acid 1 mg PO daily to a pharmacy of her choosing. We will see her back to re-evaluate in Oct 2024.   ----- Message ----- From: Leory Plowman, [facility] [place] Sent: 04/12/2022  10:18 AM EDT To: Jaci Standard, MD

## 2022-04-20 NOTE — Telephone Encounter (Signed)
LM with note below 

## 2022-04-20 NOTE — ED Provider Notes (Signed)
[place] EMERGENCY DEPARTMENT AT [facility]  Provider Note  CSN: [id number] Arrival date & time: 04/20/22 1903  History Chief Complaint  Patient presents with   Constipation    Katie Nunez is a 67 y.o. female with history of multiple medical problems including scleroderma and ITP reports she has been constipated and rectal discomfort for several days. She is taking lactulose twice a day and sometimes has soft stool, but still feels pressure in her rectum. She also reports throbbing rectal pain from wiping so much. She denies any nausea, vomiting, fever. No abdominal pain but feels bloated.     Home Medications Prior to Admission medications   Medication Sig Start Date End Date Taking? Authorizing Provider  hydrocortisone (ANUSOL-HC) 2.5 % rectal cream Place 1 Application rectally 2 (two) times daily. 04/20/22  Yes Pollyann Savoy, MD  albuterol (VENTOLIN HFA) 108 (90 Base) MCG/ACT inhaler Inhale 2 puffs into the lungs every 6 (six) hours as needed for wheezing or shortness of breath. 12/02/21   Leslye Peer, MD  ambrisentan (LETAIRIS) 5 MG tablet Take 10 mg by mouth daily.    [provider]  aspirin EC 81 MG tablet Take 1 tablet (81 mg total) by mouth daily. Swallow whole. 08/04/20   Micki Riley, MD  atorvastatin (LIPITOR) 80 MG tablet Take 1 tablet (80 mg total) by mouth daily. 06/18/21   Micki Riley, MD  azelastine (ASTELIN) 0.1 % nasal spray Place 1 spray into both nostrils 2 (two) times daily as needed for rhinitis. Use in each nostril as directed 01/23/20   Love, Evlyn Kanner, PA-C  benzonatate (TESSALON) 100 MG capsule Take 1 capsule (100 mg total) by mouth every 8 (eight) hours. 03/24/22   Sloan Leiter, DO  calcium acetate (PHOSLO) 667 MG capsule Take by mouth. 05/01/20   [provider]  camphor-menthol Wynelle Fanny) lotion Apply topically 2 (two) times daily. Patient not taking: Reported on 04/12/2022 01/23/20   Love, Evlyn Kanner, PA-C  clonazePAM  (KLONOPIN) 0.5 MG tablet Take 1 tablet (0.5 mg total) by mouth 2 (two) times daily as needed for anxiety. 01/23/20   Love, Evlyn Kanner, PA-C  co-enzyme Q-10 30 MG capsule Take 7 capsules (210 mg total) by mouth 3 (three) times daily. 06/10/21   Micki Riley, MD  cyclobenzaprine (FLEXERIL) 5 MG tablet Take 1 tablet (5 mg total) by mouth every 8 (eight) hours as needed for muscle spasms. 11/25/21   Delories Heinz, DPM  docusate sodium (COLACE) 100 MG capsule Take 2 capsules (200 mg total) by mouth daily. Patient not taking: Reported on 04/12/2022 01/23/20   Love, Evlyn Kanner, PA-C  doxycycline (VIBRA-TABS) 100 MG tablet Take 100 mg by mouth 2 (two) times daily. Patient not taking: Reported on 04/12/2022 07/16/21   [provider]  famotidine (PEPCID) 20 MG tablet Take 1 tablet (20 mg total) by mouth daily as needed for heartburn or indigestion. Patient taking differently: Take 40 mg by mouth 2 (two) times daily. 01/23/20   Love, Evlyn Kanner, PA-C  fluticasone (FLONASE) 50 MCG/ACT nasal spray Place 1 spray into both nostrils daily for 7 days. 03/24/22 03/31/22  Sloan Leiter, DO  folic acid (FOLVITE) 1 MG tablet Take 1 tablet (1 mg total) by mouth daily. 04/20/22   Jaci Standard, MD  gabapentin (NEURONTIN) 100 MG capsule Take 1 capsule (100 mg total) by mouth 3 (three) times daily. Take 1 capsule in morning and two at night 06/15/21  Micki Riley, MD  hydrocerin (EUCERIN) CREA Apply 1 application topically 2 (two) times daily. To dry skin--avoid IV site 01/23/20   Love, Evlyn Kanner, PA-C  hydrOXYzine (ATARAX/VISTARIL) 25 MG tablet Take 25 mg by mouth every 8 (eight) hours as needed for itching.  06/22/19   [provider]  levocetirizine (XYZAL) 5 MG tablet SMARTSIG:1 Tablet(s) By Mouth Every Evening 04/15/20   [provider]  lidocaine-prilocaine (EMLA) cream Apply 1 application topically as needed. Apply to toes as needed for pain Patient not taking: Reported on 04/12/2022 03/07/20   Delories Heinz, DPM  lubiprostone (AMITIZA) 8 MCG capsule Take 8 mcg by mouth 2 (two) times daily with a meal. Patient not taking: Reported on 04/12/2022    [provider]  multivitamin (RENA-VIT) TABS tablet Take 1 tablet by mouth daily at 6 (six) AM. Patient not taking: Reported on 04/12/2022 01/24/20   Love, Evlyn Kanner, PA-C  nitroGLYCERIN (NITRO-DUR) 0.2 mg/hr patch Place 1 patch (0.2 mg total) onto the skin daily. Apply near great toes bilateral to top of foot every morning.  May remove at night Patient not taking: Reported on 04/12/2022 11/25/21   Delories Heinz, DPM  oxymetazoline (AFRIN) 0.05 % nasal spray Place 1 spray into both nostrils 2 (two) times daily as needed for congestion. 01/22/20   Love, Evlyn Kanner, PA-C  Selexipag (UPTRAVI) 800 MCG TABS Take 1 tablet (800 mcg total) by mouth in the morning and at bedtime. 01/23/20   Love, Evlyn Kanner, PA-C  sodium chloride (OCEAN) 0.65 % SOLN nasal spray Place 1 spray into both nostrils 5 (five) times daily. At least 5 times a day Patient not taking: Reported on 04/12/2022 01/22/20   Love, Evlyn Kanner, PA-C  sodium chloride (OCEAN) 0.65 % SOLN nasal spray Place 1 spray into both nostrils as needed for congestion. Patient not taking: Reported on 04/12/2022 03/24/22   Sloan Leiter, DO  vitamin B-12 (CYANOCOBALAMIN) 100 MCG tablet Take 1 tablet (100 mcg total) by mouth daily. 04/20/22   Jaci Standard, MD     Allergies    Other, Tery Sanfilippo hcl], and Tape   Review of Systems   Review of Systems Please see HPI for pertinent positives and negatives  Physical Exam BP 118/74   Pulse 86   Temp 98.6 F (37 C) (Oral)   Resp 20   LMP 11/15/2008   SpO2 96%   Physical Exam Vitals and nursing note reviewed.  Constitutional:      Appearance: Normal appearance.  HENT:     Head: Normocephalic and atraumatic.     Nose: Nose normal.     Mouth/Throat:     Mouth: Mucous membranes are moist.  Eyes:     Extraocular Movements: Extraocular  movements intact.     Conjunctiva/sclera: Conjunctivae normal.  Cardiovascular:     Rate and Rhythm: Normal rate.  Pulmonary:     Effort: Pulmonary effort is normal.     Breath sounds: Normal breath sounds.  Abdominal:     General: Abdomen is flat.     Palpations: Abdomen is soft.     Tenderness: There is no abdominal tenderness.  Genitourinary:    Comments: Chaperone present, moderately excoriated and inflamed peri-rectal soft tissue without hemorrhoid or signs of abscess. No hard stool in rectal vault, there is soft brown stool on fingertip. No mass Musculoskeletal:        General: No swelling. Normal range of motion.     Cervical  back: Neck supple.  Skin:    General: Skin is warm and dry.  Neurological:     General: No focal deficit present.     Mental Status: She is alert.  Psychiatric:        Mood and Affect: Mood normal.     ED Results / Procedures / Treatments   EKG None  Procedures Procedures  Medications Ordered in the ED Medications  lidocaine (XYLOCAINE) 2 % jelly 1 Application (has no administration in time range)    Initial Impression and Plan  Patient here with rectal pain and decreased stool output. Abdomen is benign, rectal with external irritation but no signs of infection, abscess, or fecal impaction. Recommend she continue with her oral stool softeners and laxatives, made recommendation to add miralax and/or Mag Citrate if needed. Plan Rx for hydrocortisone cream to facilitate healing of her peri-rectal skin. GI and PCP follow up.   ED Course       MDM Rules/Calculators/A&P Medical Decision Making Problems Addressed: Constipation, unspecified constipation type: chronic illness or injury with exacerbation, progression, or side effects of treatment Rectal pain: acute illness or injury  Risk OTC drugs. Prescription drug management.     Final Clinical Impression(s) / ED Diagnoses Final diagnoses:  Rectal pain  Constipation, unspecified  constipation type    Rx / DC Orders ED Discharge Orders          Ordered    hydrocortisone (ANUSOL-HC) 2.5 % rectal cream  2 times daily        04/20/22 2318             Pollyann Savoy, MD 04/20/22 2318

## 2022-04-22 DIAGNOSIS — N2581 Secondary hyperparathyroidism of renal origin: Secondary | ICD-10-CM | POA: Diagnosis not present

## 2022-04-22 DIAGNOSIS — Z992 Dependence on renal dialysis: Secondary | ICD-10-CM | POA: Diagnosis not present

## 2022-04-22 DIAGNOSIS — D631 Anemia in chronic kidney disease: Secondary | ICD-10-CM | POA: Diagnosis not present

## 2022-04-22 DIAGNOSIS — N186 End stage renal disease: Secondary | ICD-10-CM | POA: Diagnosis not present

## 2022-04-22 DIAGNOSIS — D509 Iron deficiency anemia, unspecified: Secondary | ICD-10-CM | POA: Diagnosis not present

## 2022-04-24 DIAGNOSIS — D509 Iron deficiency anemia, unspecified: Secondary | ICD-10-CM | POA: Diagnosis not present

## 2022-04-24 DIAGNOSIS — Z992 Dependence on renal dialysis: Secondary | ICD-10-CM | POA: Diagnosis not present

## 2022-04-24 DIAGNOSIS — N186 End stage renal disease: Secondary | ICD-10-CM | POA: Diagnosis not present

## 2022-04-24 DIAGNOSIS — N2581 Secondary hyperparathyroidism of renal origin: Secondary | ICD-10-CM | POA: Diagnosis not present

## 2022-04-24 DIAGNOSIS — D631 Anemia in chronic kidney disease: Secondary | ICD-10-CM | POA: Diagnosis not present

## 2022-04-26 DIAGNOSIS — K7460 Unspecified cirrhosis of liver: Secondary | ICD-10-CM | POA: Diagnosis not present

## 2022-04-26 DIAGNOSIS — M3210 Systemic lupus erythematosus, organ or system involvement unspecified: Secondary | ICD-10-CM | POA: Diagnosis not present

## 2022-04-26 DIAGNOSIS — D631 Anemia in chronic kidney disease: Secondary | ICD-10-CM | POA: Diagnosis not present

## 2022-04-26 DIAGNOSIS — I272 Pulmonary hypertension, unspecified: Secondary | ICD-10-CM | POA: Diagnosis not present

## 2022-04-26 DIAGNOSIS — N186 End stage renal disease: Secondary | ICD-10-CM | POA: Diagnosis not present

## 2022-04-26 DIAGNOSIS — M3489 Other systemic sclerosis: Secondary | ICD-10-CM | POA: Diagnosis not present

## 2022-04-26 DIAGNOSIS — I120 Hypertensive chronic kidney disease with stage 5 chronic kidney disease or end stage renal disease: Secondary | ICD-10-CM | POA: Diagnosis not present

## 2022-04-26 DIAGNOSIS — Z992 Dependence on renal dialysis: Secondary | ICD-10-CM | POA: Diagnosis not present

## 2022-04-27 DIAGNOSIS — I519 Heart disease, unspecified: Secondary | ICD-10-CM | POA: Diagnosis not present

## 2022-04-27 DIAGNOSIS — D509 Iron deficiency anemia, unspecified: Secondary | ICD-10-CM | POA: Diagnosis not present

## 2022-04-27 DIAGNOSIS — Z79899 Other long term (current) drug therapy: Secondary | ICD-10-CM | POA: Diagnosis not present

## 2022-04-27 DIAGNOSIS — N186 End stage renal disease: Secondary | ICD-10-CM | POA: Diagnosis not present

## 2022-04-27 DIAGNOSIS — M349 Systemic sclerosis, unspecified: Secondary | ICD-10-CM | POA: Diagnosis not present

## 2022-04-27 DIAGNOSIS — D631 Anemia in chronic kidney disease: Secondary | ICD-10-CM | POA: Diagnosis not present

## 2022-04-27 DIAGNOSIS — I2721 Secondary pulmonary arterial hypertension: Secondary | ICD-10-CM | POA: Diagnosis not present

## 2022-04-27 DIAGNOSIS — Z992 Dependence on renal dialysis: Secondary | ICD-10-CM | POA: Diagnosis not present

## 2022-04-27 DIAGNOSIS — N2581 Secondary hyperparathyroidism of renal origin: Secondary | ICD-10-CM | POA: Diagnosis not present

## 2022-04-27 DIAGNOSIS — R0609 Other forms of dyspnea: Secondary | ICD-10-CM | POA: Diagnosis not present

## 2022-04-27 DIAGNOSIS — M359 Systemic involvement of connective tissue, unspecified: Secondary | ICD-10-CM | POA: Diagnosis not present

## 2022-04-28 ENCOUNTER — Ambulatory Visit (INDEPENDENT_AMBULATORY_CARE_PROVIDER_SITE_OTHER): Payer: Medicare Other | Admitting: Neurology

## 2022-04-28 ENCOUNTER — Encounter: Payer: Self-pay | Admitting: Neurology

## 2022-04-28 VITALS — BP 109/60 | HR 82 | Ht 65.0 in | Wt 130.4 lb

## 2022-04-28 DIAGNOSIS — G629 Polyneuropathy, unspecified: Secondary | ICD-10-CM

## 2022-04-28 DIAGNOSIS — R202 Paresthesia of skin: Secondary | ICD-10-CM

## 2022-04-28 DIAGNOSIS — Z8673 Personal history of transient ischemic attack (TIA), and cerebral infarction without residual deficits: Secondary | ICD-10-CM

## 2022-04-28 DIAGNOSIS — R0989 Other specified symptoms and signs involving the circulatory and respiratory systems: Secondary | ICD-10-CM | POA: Diagnosis not present

## 2022-04-28 DIAGNOSIS — M79671 Pain in right foot: Secondary | ICD-10-CM

## 2022-04-28 DIAGNOSIS — Z0389 Encounter for observation for other suspected diseases and conditions ruled out: Secondary | ICD-10-CM | POA: Diagnosis not present

## 2022-04-28 DIAGNOSIS — M79672 Pain in left foot: Secondary | ICD-10-CM

## 2022-04-28 DIAGNOSIS — Z5181 Encounter for therapeutic drug level monitoring: Secondary | ICD-10-CM | POA: Diagnosis not present

## 2022-04-28 MED ORDER — GABAPENTIN 100 MG PO CAPS: 100.0000 mg | ORAL_CAPSULE | Freq: Three times a day (TID) | ORAL | 0 refills | Status: DC

## 2022-04-28 NOTE — Progress Notes (Signed)
[facility] [address] [place]. [place] [id number] [phone]       STROKE FOLLOW UP NOTE  Katie Nunez Date of Birth:  1955-09-18 Medical Record Number:  [id number]   Reason for Referral:  stroke follow up    SUBJECTIVE:   CHIEF COMPLAINT:  Chief Complaint  Patient presents with   Follow-up    Patient in room #3 with her son. Patient states she here today to review her EEG for her nerve pain.     HPI:   Update 04/28/2022 : She returns for follow-up after last visit nearly a year ago.  She is accompanied by her son Katie Nunez.  Patient has been complaining of increasing bilateral leg pain and paresthesias from the calf down in both feet.  He also has intermittent burning as well as cramps in the feet.  He has been taking gabapentin at last visit advised to take 100 mg in the morning and 200 at night however she has not been following the dysphonia 100 mg at night which seems to help.  She is scared to increase the dose because of fear of side effects.  She spoke to her primary care physician who ordered a nerve conduction study which was done on 03/10/2022 by Dr. Murray Hodgkins which commonly found sensorimotor peripheral neuropathy with absent bilateral sural responses.  Chronic neurogenic denervation  Update 06/10/2021 ; she returns for follow-up after last visit 6 months ago.  She is accompanied by husband.  Patient is doing well and had no recurrent stroke or TIA symptoms.  She states her blood pressure is in fact been quite low and today is 90/52.  She had cardiac ablation and since then both the heart rate and blood pressures have been quite low.  She remains on aspirin she is tolerating well without bruising or patient does not have diabetes has not been evaluated for causes of neuropathy.  Gait and balance difficulties but is able to ambulate.  She admits her balance is poor but fortunately she has had no falls or injuries.  She remains on aspirin for stroke  prevention and has had no recurrent stroke or TIA symptoms.  She did see a cardiologist recently had an echocardiogram done yesterday.  She states her blood pressure has been running quite low following her ablation.  He is tolerating Lipitor well without side effects last lipid profile on 05/3121 showed LDL to be 105 mg percent.  She she continues to have mild right-sided weakness and gait difficulties but does fairly well and uses a cane only for long distances.  She has had no falls or injuries.  She however complains of increased cramps in the legs particularly at night.  She does take gabapentin 100 twice daily but its not helping as much.  She has not tried higher doses yet.  She has Trental listed as a medication for peripheral vascular disease but has not been taking it and is not sure she needs it.  Advised her to discuss this with her vascular surgeon.  She has not had lipid profile checked for quite some time. Update 12/02/2020 JM: Returns for 29-month stroke follow-up accompanied by her husband  Overall stable since prior visit -denies new stroke/TIA symptoms Residual right spastic hemiparesis - reports stable since prior visit. Some pain RLE distally. She questions possible restart of PT. Occasional use of Flexeril and gabapentin Continues to use a cane for ambulation -denies any recent falls Does report R>L foot numbness typically worse at  night - this has been affecting her gait. She does have known scleroderma and questions if these symptoms could be part of that Currently working with PT on right sided neck/shoulder pain with some improvement - followed by orthopedics - considering use of cortisone injection per patient. Chronic issue but worsened post stroke  Compliant on aspirin - denies side effects Does not consistently take atorvastatin - she did not feel this was needed. Denies side effects.  Blood pressure today 98/68 -routinely followed by nephrology and cardiology for blood  pressure management  Routinely follows with GI for hepatic cirrhosis - reports recently dx'd with esophageal and rectal varices - has f/u 11/30 to further discuss Routinely follows with [facility] cardiology She is also followed by [facility] neurology  No further concerns at this time     History provided for reference purposes only Update 08/04/2020 Dr. Pearlean Brownie She returns for follow-up after last visit with Shanda Bumps our nurse practitioner 5 months ago.  She is doing well without recurrent stroke or TIA symptoms.  She continues to have right-sided weakness with is able to walk with a cane and has finished outpatient physical and Occupational Therapy.  She still has mild right foot drop.  She has had no falls or injuries.  She has not started taking aspirin or Eliquis and has questions about her treatment options.  She had a follow-up CT scan of the brain done on 03/25/2020 which showed satisfactory resolution of the left frontal parenchymal hematoma without any acute abnormalities.  She continues to have low blood pressure with the systolic in the 80s ever since her diagnosis and treatment for pulmonary hypertension.  Today it is 80/57 but she denies having significant symptoms of orthostasis, tiredness and fatigue.  Initial visit 02/20/2020 JM: [name] is being seen for hospital follow-up accompanied by her husband. She was discharged home from CIR on 01/23/2020. She has been doing well since discharge without new stroke/TIA symptoms and reports residual right-sided weakness and gait impairment with continued improvement.  Currently working with neuro rehab PT/OT.  She does report increased tone in RUE muscle tightness which is typically worse at night.  She was using Flexeril during CIR with benefit and questions if this could be restarted.  Ambulates with cane and denies any recent falls.  She is not using assistive device PTA but was using rolling walker initially at discharge.  She remains on atorvastatin 40  mg daily without myalgias.  Blood pressure today 133/72.  BP routinely monitored at dialysis.  No further concerns at this time.  Stroke admission 12/12/2019 [name] is a 67 y.o. female with history of ESRD on HD TTS, fibromyalgia, hypertension, mixed connective tissue disorder, scleroderma, pulmonaryhypertension, atrial fibrillation, cirrhosis-on Coumadin for portal vein thrombosis and atrial fibrillation who presented to [facility] ED on 12/12/2019 with R sided weakness. Personally reviewed hospitalization pertinent progress notes, lab work and imaging with summary provided. Evaluated by Dr. Roda Shutters with stroke work-up revealing left frontal IPH likely secondary to warfarin coagulopathy. Warfarin was reversed with vitamin K and Kcentra with INR of 1.7 on admission and recommended follow-up outpatient to consider resuming warfarin or consider Eliquis once ICH resolves. History of HTN stable. LDL 79 and recommend consideration of statin at discharge for follow-up in setting of hemorrhage. No history or evidence of DM with A1c 5.2. Other stroke risk factors include ESRD on HD and OSA. Other active problems include anemia of ESRD, metabolic bone disease, pulmonary hypertension, RA, scleroderma and fibromyalgia. Residual deficit  of right hemiplegia. Evaluated by therapies and discharged to CIR on 12/18/2019 for ongoing therapy needs.  Stroke:   L frontal IPH d/t warfarin coagulopathy  CT head acute L frontal IPH w/ mild edema Repeat CT head stable L frontal IPH Repeat CT head unchanged L frontal IPH MRI  Subcortical L frontal white matter IPH w/ rim edema. few remote B hemorrhages upper cerebrum. MRA  Unremarkable  2D Echo EF 65 to 70% LDL 79 HgbA1c 5.2 VTE prophylaxis - SCDs  warfarin daily prior to admission, now on No antithrombotic given IPH Therapy recommendations:  CIR Disposition:  CIR       ROS:   14 system review of systems performed and negative with exception of those listed in  HPI  PMH:  Past Medical History:  Diagnosis Date   Achalasia    Anxiety    Dysplasia of cervix, low grade (CIN 1)    Environmental allergies    "all year long" (12/27/2016)   ESRD (end stage renal disease) on dialysis    "TTS; [facility]" (12/27/2016)   Fibromyalgia    GERD (gastroesophageal reflux disease)    Gout    Hypertension    IBS (irritable bowel syndrome)    MVP (mitral valve prolapse)    RA (rheumatoid arthritis)    FOLLOWED BY DR. SHANAHAN   Raynaud's disease    Scleroderma    Seasonal allergies    Thrombocytopenia 07/01/2016   Acute fall to 13,000 07/01/16   Tubular adenoma 01/08/2008   CECUM   Vitamin D deficiency     PSH:  Past Surgical History:  Procedure Laterality Date   ANKLE FRACTURE SURGERY Right    AV FISTULA PLACEMENT Left 06/28/2016   Procedure: left arm ARTERIOVENOUS (AV) FISTULA CREATION;  Surgeon: Larina Earthly, MD;  Location: [facility] OR;  Service: Vascular;  Laterality: Left;   BASCILIC VEIN TRANSPOSITION Left 09/27/2016   Procedure: LEFT UPPER ARM CEPHALIC VEIN TRANSPOSITION;  Surgeon: Larina Earthly, MD;  Location: [facility] OR;  Service: Vascular;  Laterality: Left;   BREAST BIOPSY     "? side"   CESAREAN SECTION  1994   CO2 LASER OF CERVIX     COLONOSCOPY W/ BIOPSIES  01/08/2008   INSERTION OF DIALYSIS CATHETER Right 06/28/2016   Procedure: INSERTION OF DIALYSIS CATHETER, right internal jugular;  Surgeon: Larina Earthly, MD;  Location: [facility] OR;  Service: Vascular;  Laterality: Right;   MYOMECTOMY     NASAL ENDOSCOPY WITH EPISTAXIS CONTROL N/A 12/29/2019   Procedure: NASAL ENDOSCOPY WITH EPISTAXIS CONTROL;  Surgeon: Newman Pies, MD;  Location: [facility] OR;  Service: ENT;  Laterality: N/A;   PELVIC LAPAROSCOPY  2011   superficial thrombophlebitis Left 07-2014    Social History:  Social History   Socioeconomic History   Marital status: Married    Spouse name: Not on file   Number of children: Not on file   Years of education: Not on file   Highest education  level: Not on file  Occupational History   Not on file  Tobacco Use   Smoking status: Never   Smokeless tobacco: Never  Vaping Use   Vaping Use: Never used  Substance and Sexual Activity   Alcohol use: No   Drug use: No   Sexual activity: Not Currently    Birth control/protection: Post-menopausal  Other Topics Concern   Not on file  Social History Narrative   Lives in [place]   Social Determinants of Health   Financial Resource Strain:  Not on file  Food Insecurity: Not on file  Transportation Needs: Not on file  Physical Activity: Not on file  Stress: Not on file  Social Connections: Not on file  Intimate Partner Violence: Not on file    Family History:  Family History  Problem Relation Age of Onset   Hypertension Mother    Diabetes Mother    Heart disease Father    Hypertension Maternal Aunt    Diabetes Maternal Grandmother    Heart disease Paternal Grandfather    Cerebral palsy Cousin        1ST COUSIN?   Diabetes Paternal Grandmother     Medications:   Current Outpatient Medications on File Prior to Visit  Medication Sig Dispense Refill   albuterol (VENTOLIN HFA) 108 (90 Base) MCG/ACT inhaler Inhale 2 puffs into the lungs every 6 (six) hours as needed for wheezing or shortness of breath. 8 g 6   ambrisentan (LETAIRIS) 5 MG tablet Take 10 mg by mouth daily.     aspirin EC 81 MG tablet Take 1 tablet (81 mg total) by mouth daily. Swallow whole. 30 tablet 11   atorvastatin (LIPITOR) 80 MG tablet Take 1 tablet (80 mg total) by mouth daily. 90 tablet 3   azelastine (ASTELIN) 0.1 % nasal spray Place 1 spray into both nostrils 2 (two) times daily as needed for rhinitis. Use in each nostril as directed 30 mL 12   benzonatate (TESSALON) 100 MG capsule Take 1 capsule (100 mg total) by mouth every 8 (eight) hours. 21 capsule 0   calcium acetate (PHOSLO) 667 MG capsule Take by mouth.     camphor-menthol (SARNA) lotion Apply topically 2 (two) times daily. 222 mL 0    clonazePAM (KLONOPIN) 0.5 MG tablet Take 1 tablet (0.5 mg total) by mouth 2 (two) times daily as needed for anxiety. 30 tablet 0   co-enzyme Q-10 30 MG capsule Take 7 capsules (210 mg total) by mouth 3 (three) times daily. 1 capsule 1   cyclobenzaprine (FLEXERIL) 5 MG tablet Take 1 tablet (5 mg total) by mouth every 8 (eight) hours as needed for muscle spasms. 30 tablet 0   docusate sodium (COLACE) 100 MG capsule Take 2 capsules (200 mg total) by mouth daily. 60 capsule 0   doxycycline (VIBRA-TABS) 100 MG tablet Take 100 mg by mouth 2 (two) times daily.     famotidine (PEPCID) 20 MG tablet Take 1 tablet (20 mg total) by mouth daily as needed for heartburn or indigestion. (Patient taking differently: Take 40 mg by mouth 2 (two) times daily.) 30 tablet 0   folic acid (FOLVITE) 1 MG tablet Take 1 tablet (1 mg total) by mouth daily. 90 tablet 2   gabapentin (NEURONTIN) 100 MG capsule Take 1 capsule (100 mg total) by mouth 3 (three) times daily. Take 1 capsule in morning and two at night 90 capsule 0   hydrocerin (EUCERIN) CREA Apply 1 application topically 2 (two) times daily. To dry skin--avoid IV site 454 g 0   hydrocortisone (ANUSOL-HC) 2.5 % rectal cream Place 1 Application rectally 2 (two) times daily. 30 g 0   hydrOXYzine (ATARAX/VISTARIL) 25 MG tablet Take 25 mg by mouth every 8 (eight) hours as needed for itching.      levocetirizine (XYZAL) 5 MG tablet SMARTSIG:1 Tablet(s) By Mouth Every Evening     lidocaine-prilocaine (EMLA) cream Apply 1 application topically as needed. Apply to toes as needed for pain 30 g 3   lubiprostone (AMITIZA) 8 MCG  capsule Take 8 mcg by mouth 2 (two) times daily with a meal.     multivitamin (RENA-VIT) TABS tablet Take 1 tablet by mouth daily at 6 (six) AM. 30 tablet 0   nitroGLYCERIN (NITRO-DUR) 0.2 mg/hr patch Place 1 patch (0.2 mg total) onto the skin daily. Apply near great toes bilateral to top of foot every morning.  May remove at night 30 patch 4    oxymetazoline (AFRIN) 0.05 % nasal spray Place 1 spray into both nostrils 2 (two) times daily as needed for congestion. 30 mL 0   Selexipag (UPTRAVI) 800 MCG TABS Take 1 tablet (800 mcg total) by mouth in the morning and at bedtime. 60 tablet 0   sodium chloride (OCEAN) 0.65 % SOLN nasal spray Place 1 spray into both nostrils 5 (five) times daily. At least 5 times a day  0   sodium chloride (OCEAN) 0.65 % SOLN nasal spray Place 1 spray into both nostrils as needed for congestion. 44 mL 0   vitamin B-12 (CYANOCOBALAMIN) 100 MCG tablet Take 1 tablet (100 mcg total) by mouth daily. 90 tablet 1   fluticasone (FLONASE) 50 MCG/ACT nasal spray Place 1 spray into both nostrils daily for 7 days. 15.8 mL 0   No current facility-administered medications on file prior to visit.    Allergies:   Allergies  Allergen Reactions   Other Anaphylaxis and Other (See Comments)    Do not use polyflux membrane.  Use alternate Other reaction(s): heart racing   Savella [Milnacipran Hcl] Palpitations and Other (See Comments)    Unknown   Tape Rash and Other (See Comments)    Itch- unsure if it was paper or adhesive tape      OBJECTIVE:  Physical Exam  Vitals:   04/28/22 1106  BP: 109/60  Pulse: 82  Weight: 130 lb 6.4 oz (59.1 kg)  Height:  (1.651 m)   Body mass index is 21.7 kg/m. No results found.  General: Frail very pleasant middle-aged African-American female, seated, in no evident distress Head: head normocephalic and atraumatic.   Neck: supple with no carotid or supraclavicular bruits Cardiovascular: regular rate and rhythm, soft ejection systolic murmur throughout precordium Musculoskeletal: no deformity Skin:  no rash/petichiae; +bruit and +thrill LUE fistula Vascular:  Normal pulses all extremities   Neurologic Exam Mental Status: Awake and fully alert.  Fluent speech and language. Oriented to place and time. Recent and remote memory intact. Attention span, concentration and fund  of knowledge appropriate. Mood and affect appropriate.  Cranial Nerves: Pupils equal, briskly reactive to light. Extraocular movements full without nystagmus. Visual fields full to confrontation. Hearing intact. Facial sensation intact. Face, tongue, palate moves normally and symmetrically.  Motor: Normal bulk and tone and strength LUE and LLE.  RUE: 4/5 with slightly decreased hand dexterity and increased tone; RLE: 4/5 hip flexor and ankle dorsiflexion and mildly increased tone distally Sensory.: i hyperesthesia touch , pinprick in both legs from calf down.  Preserved, position and vibratory sensation.  Coordination: Rapid alternating movements normal in all extremities except right hand. Finger-to-nose and heel-to-shin performed accurately bilaterally Gait and Station: Arises from chair without difficulty. Stance is normal. Gait is slightly broad-based and demonstrates mild spastic hemiplegic gait with mild circumduction foot drop on the right.  With use of cane.  Tandem walk and heel toe not attempted Reflexes: 2+ RUE and RLE; 1+ LUE and LLE. Toes downgoing.         ASSESSMENT: Katie Nunez is a 67  y.o. year old female presented with right-sided weakness on 12/12/2019 with stroke work-up revealing left frontal IPH secondary to warfarin coagulopathy. Vascular risk factors include HTN, HLD, atrial fibrillation s/p ablation, portal vein thrombosis, pulmonary hypertension, rheumatoid arthritis, chronic right-sided heart failure, scleroderma and ESRD on HD.  Patient is doing well but continues to have mild spastic right hemiparesis . Recent increase leg cramps, pain and paresthesia from newly diagnosed peripheral neuropathy of undetermined etiology       PLAN: I had a long discussion with the patient and her son regarding her remote intraparenchymal hemorrhage and mild residual right hemiparesis and leg cramps, pain and paresthesias, new diagnosis of peripheral neuropathy and answered  questions.  I recommend she add co-Q10 200 mg daily to help with statin myalgias and cramps as well as increase the dose of gabapentin 100 mg in the morning and 200 milligrams at night.  Check neuropathy panel labs, lipid profile, hemoglobin A1c screening carotid ultrasound.  She will stay on aspirin for stroke prevention and maintain aggressive risk factor modification strict control of hypertension blood pressure goal below 130/90, lipids with LDL cholesterol goal below 70 mg percent and diabetes with hemoglobin A1c goal below 6.5%.  She will return for follow-up in the future in 4 months or call earlier if necessary..  She will return for follow-up in the future in a year or call earlier if necessary.  Greater than 50% time during this 40-minute visit was spent on counseling and coordination of care about her cramps and post hemorrhage spasticity and answering questions.  Delia Heady, MD [facility] [address] [place], [place] [address]  Phone [phone] Fax [phone] Note: This document was prepared with digital dictation and possible smart phrase technology. Any transcriptional errors that result from this process are unintentional.

## 2022-04-28 NOTE — Patient Instructions (Addendum)
I had a long discussion with the patient and her son regarding her remote intraparenchymal hemorrhage and mild residual right hemiparesis and leg cramps, pain and paresthesias, new diagnosis of peripheral neuropathy and answered questions.  I recommend she add co-Q10 200 mg daily to help with statin myalgias and cramps as well as increase the dose of gabapentin 100 mg in the morning and 200 milligrams at night.  Check neuropathy panel labs, lipid profile, hemoglobin A1c screening carotid ultrasound.  She will stay on aspirin for stroke prevention and maintain aggressive risk factor modification strict control of hypertension blood pressure goal below 130/90, lipids with LDL cholesterol goal below 70 mg percent and diabetes with hemoglobin A1c goal below 6.5%.  She will return for follow-up in the future in 4 months or call earlier if necessary.  Peripheral Neuropathy Peripheral neuropathy is a type of nerve damage. It affects nerves that carry signals between the spinal cord and the arms, legs, and the rest of the body (peripheral nerves). It does not affect nerves in the spinal cord or brain. In peripheral neuropathy, one nerve or a group of nerves may be damaged. Peripheral neuropathy is a broad category that includes many specific nerve disorders, like diabetic neuropathy, hereditary neuropathy, and carpal tunnel syndrome. What are the causes? This condition may be caused by: Certain diseases, such as: Diabetes. This is the most common cause of peripheral neuropathy. Autoimmune diseases, such as rheumatoid arthritis and systemic lupus erythematosus. Nerve diseases that are passed from parent to child (inherited). Kidney disease. Thyroid disease. Other causes may include: Nerve injury. Pressure or stress on a nerve that lasts a long time. Lack (deficiency) of B vitamins. This can result from alcoholism, poor diet, or a restricted diet. Infections. Some medicines, such as cancer medicines  (chemotherapy). Poisonous (toxic) substances, such as lead and mercury. Too little blood flowing to the legs. In some cases, the cause of this condition is not known. What are the signs or symptoms? Symptoms of this condition depend on which of your nerves is damaged. Symptoms in the legs, hands, and arms can include: Loss of feeling (numbness) in the feet, hands, or both. Tingling in the feet, hands, or both. Burning pain. Very sensitive skin. Weakness. Not being able to move a part of the body (paralysis). Clumsiness or poor coordination. Muscle twitching. Loss of balance. Symptoms in other parts of the body can include: Not being able to control your bladder. Feeling dizzy. Sexual problems. How is this diagnosed? Diagnosing and finding the cause of peripheral neuropathy can be difficult. Your health care provider will take your medical history and do a physical exam. A neurological exam will also be done. This involves checking things that are affected by your brain, spinal cord, and nerves (nervous system). For example, your health care provider will check your reflexes, how you move, and what you can feel. You may have other tests, such as: Blood tests. Electromyogram (EMG) and nerve conduction tests. These tests check nerve function and how well the nerves are controlling the muscles. Imaging tests, such as a CT scan or MRI, to rule out other causes of your symptoms. Removing a small piece of nerve to be examined in a lab (nerve biopsy). Removing and examining a small amount of the fluid that surrounds the brain and spinal cord (lumbar puncture). How is this treated? Treatment for this condition may involve: Treating the underlying cause of the neuropathy, such as diabetes, kidney disease, or vitamin deficiencies. Stopping medicines that can  cause neuropathy, such as chemotherapy. Medicine to help relieve pain. Medicines may include: Prescription or over-the-counter pain  medicine. Anti-seizure medicine. Antidepressants. Pain-relieving patches that are applied to painful areas of skin. Surgery to relieve pressure on a nerve or to destroy a nerve that is causing pain. Physical therapy to help improve movement and balance. Devices to help you move around (assistive devices). Follow these instructions at home: Medicines Take over-the-counter and prescription medicines only as told by your health care provider. Do not take any other medicines without first asking your health care provider. Ask your health care provider if the medicine prescribed to you requires you to avoid driving or using machinery. Lifestyle  Do not use any products that contain nicotine or tobacco. These products include cigarettes, chewing tobacco, and vaping devices, such as e-cigarettes. Smoking keeps blood from reaching damaged nerves. If you need help quitting, ask your health care provider. Avoid or limit alcohol. Too much alcohol can cause a vitamin B deficiency, and vitamin B is needed for healthy nerves. Eat a healthy diet. This includes: Eating foods that are high in fiber, such as beans, whole grains, and fresh fruits and vegetables. Limiting foods that are high in fat and processed sugars, such as fried or sweet foods. General instructions  If you have diabetes, work closely with your health care provider to keep your blood sugar under control. If you have numbness in your feet: Check every day for signs of injury or infection. Watch for redness, warmth, and swelling. Wear padded socks and comfortable shoes. These help protect your feet. Develop a good support system. Living with peripheral neuropathy can be stressful. Consider talking with a mental health specialist or joining a support group. Use assistive devices and attend physical therapy as told by your health care provider. This may include using a walker or a cane. Keep all follow-up visits. This is important. Where to  find more information [employer] [facility] [employer]: [employer] Contact a health care provider if: You have new signs or symptoms of peripheral neuropathy. You are struggling emotionally from dealing with peripheral neuropathy. Your pain is not well controlled. Get help right away if: You have an injury or infection that is not healing normally. You develop new weakness in an arm or leg. You have fallen or do so frequently. Summary Peripheral neuropathy is when the nerves in the arms or legs are damaged, resulting in numbness, weakness, or pain. There are many causes of peripheral neuropathy, including diabetes, pinched nerves, vitamin deficiencies, autoimmune disease, and hereditary conditions. Diagnosing and finding the cause of peripheral neuropathy can be difficult. Your health care provider will take your medical history, do a physical exam, and do tests, including blood tests and nerve function tests. Treatment involves treating the underlying cause of the neuropathy and taking medicines to help control pain. Physical therapy and assistive devices may also help. This information is not intended to replace advice given to you by your health care provider. Make sure you discuss any questions you have with your health care provider. Document Revised: 09/02/2020 Document Reviewed: 09/02/2020 Elsevier Patient Education  2023 [employer].

## 2022-04-29 DIAGNOSIS — N186 End stage renal disease: Secondary | ICD-10-CM | POA: Diagnosis not present

## 2022-04-29 DIAGNOSIS — N2581 Secondary hyperparathyroidism of renal origin: Secondary | ICD-10-CM | POA: Diagnosis not present

## 2022-04-29 DIAGNOSIS — D509 Iron deficiency anemia, unspecified: Secondary | ICD-10-CM | POA: Diagnosis not present

## 2022-04-29 DIAGNOSIS — D631 Anemia in chronic kidney disease: Secondary | ICD-10-CM | POA: Diagnosis not present

## 2022-04-29 DIAGNOSIS — Z992 Dependence on renal dialysis: Secondary | ICD-10-CM | POA: Diagnosis not present

## 2022-04-29 LAB — NEUROPATHY PANEL
Sed Rate: 7 mm/hr (ref 0–40)
Vit D, 25-Hydroxy: 12.2 ng/mL — ABNORMAL LOW (ref 30.0–100.0)

## 2022-04-29 LAB — LIPID PANEL
HDL: 80 mg/dL (ref >39)
LDL Chol Calc (NIH): 59 mg/dL (ref 0–99)

## 2022-04-30 NOTE — Progress Notes (Signed)
Kindly inform the patient that cholesterol profile and screening test for diabetes was satisfactory. Her vitamin D level is quite low and she needs to see primary care physician for replacement. All her lab levels for causes of neuropathy are not back yet but test for lupus and rheumatoid are positive and she will needs to see Dr. Merri Brunette her primary care physician for this and possibly get a referral to a rheumatologist and this may be the cause of her neuropathy

## 2022-05-01 DIAGNOSIS — N186 End stage renal disease: Secondary | ICD-10-CM | POA: Diagnosis not present

## 2022-05-01 DIAGNOSIS — Z992 Dependence on renal dialysis: Secondary | ICD-10-CM | POA: Diagnosis not present

## 2022-05-01 DIAGNOSIS — D631 Anemia in chronic kidney disease: Secondary | ICD-10-CM | POA: Diagnosis not present

## 2022-05-01 DIAGNOSIS — N2581 Secondary hyperparathyroidism of renal origin: Secondary | ICD-10-CM | POA: Diagnosis not present

## 2022-05-01 DIAGNOSIS — D509 Iron deficiency anemia, unspecified: Secondary | ICD-10-CM | POA: Diagnosis not present

## 2022-05-04 DIAGNOSIS — D631 Anemia in chronic kidney disease: Secondary | ICD-10-CM | POA: Diagnosis not present

## 2022-05-04 DIAGNOSIS — D509 Iron deficiency anemia, unspecified: Secondary | ICD-10-CM | POA: Diagnosis not present

## 2022-05-04 DIAGNOSIS — N2581 Secondary hyperparathyroidism of renal origin: Secondary | ICD-10-CM | POA: Diagnosis not present

## 2022-05-04 DIAGNOSIS — Z992 Dependence on renal dialysis: Secondary | ICD-10-CM | POA: Diagnosis not present

## 2022-05-04 DIAGNOSIS — N186 End stage renal disease: Secondary | ICD-10-CM | POA: Diagnosis not present

## 2022-05-04 LAB — NEUROPATHY PANEL
Alpha 1: 0.2 g/dL (ref 0.0–0.4)
Alpha 2: 0.6 g/dL (ref 0.4–1.0)
Angio Convert Enzyme: 49 U/L (ref 14–82)
Anti Nuclear Antibody (ANA): POSITIVE — AB
Beta: 1 g/dL (ref 0.7–1.3)
Globulin, Total: 2.9 g/dL (ref 2.2–3.9)
Rheumatoid fact SerPl-aCnc: 14.1 IU/mL — ABNORMAL HIGH (ref <14.0)
TSH: 1.71 u[IU]/mL (ref 0.450–4.500)
Total Protein: 6.9 g/dL (ref 6.0–8.5)
Vitamin B-12: 441 pg/mL (ref 232–1245)

## 2022-05-04 LAB — LIPID PANEL
Chol/HDL Ratio: 1.9 ratio (ref 0.0–4.4)
Cholesterol, Total: 155 mg/dL (ref 100–199)
Triglycerides: 87 mg/dL (ref 0–149)
VLDL Cholesterol Cal: 16 mg/dL (ref 5–40)

## 2022-05-04 LAB — HEMOGLOBIN A1C
Est. average glucose Bld gHb Est-mCnc: 108 mg/dL
Hgb A1c MFr Bld: 5.4 % (ref 4.8–5.6)

## 2022-05-06 DIAGNOSIS — D509 Iron deficiency anemia, unspecified: Secondary | ICD-10-CM | POA: Diagnosis not present

## 2022-05-06 DIAGNOSIS — Z992 Dependence on renal dialysis: Secondary | ICD-10-CM | POA: Diagnosis not present

## 2022-05-06 DIAGNOSIS — N186 End stage renal disease: Secondary | ICD-10-CM | POA: Diagnosis not present

## 2022-05-06 DIAGNOSIS — N2581 Secondary hyperparathyroidism of renal origin: Secondary | ICD-10-CM | POA: Diagnosis not present

## 2022-05-06 DIAGNOSIS — D631 Anemia in chronic kidney disease: Secondary | ICD-10-CM | POA: Diagnosis not present

## 2022-05-08 DIAGNOSIS — D631 Anemia in chronic kidney disease: Secondary | ICD-10-CM | POA: Diagnosis not present

## 2022-05-08 DIAGNOSIS — N186 End stage renal disease: Secondary | ICD-10-CM | POA: Diagnosis not present

## 2022-05-08 DIAGNOSIS — N2581 Secondary hyperparathyroidism of renal origin: Secondary | ICD-10-CM | POA: Diagnosis not present

## 2022-05-08 DIAGNOSIS — Z992 Dependence on renal dialysis: Secondary | ICD-10-CM | POA: Diagnosis not present

## 2022-05-08 DIAGNOSIS — D509 Iron deficiency anemia, unspecified: Secondary | ICD-10-CM | POA: Diagnosis not present

## 2022-05-11 ENCOUNTER — Telehealth: Payer: Self-pay | Admitting: Neurology

## 2022-05-11 DIAGNOSIS — D631 Anemia in chronic kidney disease: Secondary | ICD-10-CM | POA: Diagnosis not present

## 2022-05-11 DIAGNOSIS — N269 Renal sclerosis, unspecified: Secondary | ICD-10-CM | POA: Diagnosis not present

## 2022-05-11 DIAGNOSIS — N2581 Secondary hyperparathyroidism of renal origin: Secondary | ICD-10-CM | POA: Diagnosis not present

## 2022-05-11 DIAGNOSIS — N186 End stage renal disease: Secondary | ICD-10-CM | POA: Diagnosis not present

## 2022-05-11 DIAGNOSIS — D509 Iron deficiency anemia, unspecified: Secondary | ICD-10-CM | POA: Diagnosis not present

## 2022-05-11 DIAGNOSIS — Z992 Dependence on renal dialysis: Secondary | ICD-10-CM | POA: Diagnosis not present

## 2022-05-11 NOTE — Telephone Encounter (Signed)
Pt is asking for a call to discuss the reason as to why Dr Pearlean Brownie is wanting her to have the Ultrasound.  Pt states it was never really explained to her what Dr Pearlean Brownie would be looking for in this test.

## 2022-05-12 DIAGNOSIS — L299 Pruritus, unspecified: Secondary | ICD-10-CM | POA: Diagnosis not present

## 2022-05-12 DIAGNOSIS — L219 Seborrheic dermatitis, unspecified: Secondary | ICD-10-CM | POA: Diagnosis not present

## 2022-05-12 DIAGNOSIS — L720 Epidermal cyst: Secondary | ICD-10-CM | POA: Diagnosis not present

## 2022-05-12 NOTE — Telephone Encounter (Signed)
Called the pt back. She states she just wasn't understanding the purpose of the test. Advised given her history he is just wanting to assess and make sure that there is good blood flow in the carotid area. This is part of work up for further stroke prevention. Pt verbalized understanding and was appreciative for the call back.

## 2022-05-13 DIAGNOSIS — N186 End stage renal disease: Secondary | ICD-10-CM | POA: Diagnosis not present

## 2022-05-13 DIAGNOSIS — D509 Iron deficiency anemia, unspecified: Secondary | ICD-10-CM | POA: Diagnosis not present

## 2022-05-13 DIAGNOSIS — D631 Anemia in chronic kidney disease: Secondary | ICD-10-CM | POA: Diagnosis not present

## 2022-05-13 DIAGNOSIS — N2581 Secondary hyperparathyroidism of renal origin: Secondary | ICD-10-CM | POA: Diagnosis not present

## 2022-05-13 DIAGNOSIS — Z992 Dependence on renal dialysis: Secondary | ICD-10-CM | POA: Diagnosis not present

## 2022-05-14 NOTE — Progress Notes (Signed)
Kindly inform the patient that all lab work for reversible causes of neuropathy are back and acceptable.  Kindly remind her to see primary care physician for evaluation for lupus, rheumatoid arthritis and low vitamin D levels

## 2022-05-15 DIAGNOSIS — D509 Iron deficiency anemia, unspecified: Secondary | ICD-10-CM | POA: Diagnosis not present

## 2022-05-15 DIAGNOSIS — N2581 Secondary hyperparathyroidism of renal origin: Secondary | ICD-10-CM | POA: Diagnosis not present

## 2022-05-15 DIAGNOSIS — D631 Anemia in chronic kidney disease: Secondary | ICD-10-CM | POA: Diagnosis not present

## 2022-05-15 DIAGNOSIS — Z992 Dependence on renal dialysis: Secondary | ICD-10-CM | POA: Diagnosis not present

## 2022-05-15 DIAGNOSIS — N186 End stage renal disease: Secondary | ICD-10-CM | POA: Diagnosis not present

## 2022-05-17 ENCOUNTER — Telehealth: Payer: Self-pay

## 2022-05-17 NOTE — Telephone Encounter (Signed)
I left a voicemail detailing results (as per DPR). A number for return call was provided for further questions or concerns.

## 2022-05-17 NOTE — Telephone Encounter (Signed)
-----   Message from Deatra James, RN sent at 05/17/2022  9:30 AM EDT -----  ----- Message ----- From: Micki Riley, MD Sent: 05/14/2022   8:05 AM EDT To: Tamala Ser, RT (T); Gna-Pod 2 Results  Kindly inform the patient that all lab work for reversible causes of neuropathy are back and acceptable.  Kindly remind her to see primary care physician for evaluation for lupus, rheumatoid arthritis and low vitamin D levels

## 2022-05-18 DIAGNOSIS — D509 Iron deficiency anemia, unspecified: Secondary | ICD-10-CM | POA: Diagnosis not present

## 2022-05-18 DIAGNOSIS — N2581 Secondary hyperparathyroidism of renal origin: Secondary | ICD-10-CM | POA: Diagnosis not present

## 2022-05-18 DIAGNOSIS — Z992 Dependence on renal dialysis: Secondary | ICD-10-CM | POA: Diagnosis not present

## 2022-05-18 DIAGNOSIS — D631 Anemia in chronic kidney disease: Secondary | ICD-10-CM | POA: Diagnosis not present

## 2022-05-18 DIAGNOSIS — N186 End stage renal disease: Secondary | ICD-10-CM | POA: Diagnosis not present

## 2022-05-19 ENCOUNTER — Ambulatory Visit (HOSPITAL_COMMUNITY)
Admission: RE | Admit: 2022-05-19 | Discharge: 2022-05-19 | Disposition: A | Discharge status: Home / Self Care | Payer: Medicare Other | Source: Ambulatory Visit | Attending: Neurology | Admitting: Neurology

## 2022-05-19 DIAGNOSIS — Z8673 Personal history of transient ischemic attack (TIA), and cerebral infarction without residual deficits: Secondary | ICD-10-CM | POA: Insufficient documentation

## 2022-05-20 DIAGNOSIS — R0609 Other forms of dyspnea: Secondary | ICD-10-CM | POA: Diagnosis not present

## 2022-05-20 DIAGNOSIS — D631 Anemia in chronic kidney disease: Secondary | ICD-10-CM | POA: Diagnosis not present

## 2022-05-20 DIAGNOSIS — N186 End stage renal disease: Secondary | ICD-10-CM | POA: Diagnosis not present

## 2022-05-20 DIAGNOSIS — D509 Iron deficiency anemia, unspecified: Secondary | ICD-10-CM | POA: Diagnosis not present

## 2022-05-20 DIAGNOSIS — Z992 Dependence on renal dialysis: Secondary | ICD-10-CM | POA: Diagnosis not present

## 2022-05-20 DIAGNOSIS — I270 Primary pulmonary hypertension: Secondary | ICD-10-CM | POA: Diagnosis not present

## 2022-05-20 DIAGNOSIS — N2581 Secondary hyperparathyroidism of renal origin: Secondary | ICD-10-CM | POA: Diagnosis not present

## 2022-05-22 DIAGNOSIS — N2581 Secondary hyperparathyroidism of renal origin: Secondary | ICD-10-CM | POA: Diagnosis not present

## 2022-05-22 DIAGNOSIS — D631 Anemia in chronic kidney disease: Secondary | ICD-10-CM | POA: Diagnosis not present

## 2022-05-22 DIAGNOSIS — N186 End stage renal disease: Secondary | ICD-10-CM | POA: Diagnosis not present

## 2022-05-22 DIAGNOSIS — Z992 Dependence on renal dialysis: Secondary | ICD-10-CM | POA: Diagnosis not present

## 2022-05-22 DIAGNOSIS — D509 Iron deficiency anemia, unspecified: Secondary | ICD-10-CM | POA: Diagnosis not present

## 2022-05-25 DIAGNOSIS — D509 Iron deficiency anemia, unspecified: Secondary | ICD-10-CM | POA: Diagnosis not present

## 2022-05-25 DIAGNOSIS — N186 End stage renal disease: Secondary | ICD-10-CM | POA: Diagnosis not present

## 2022-05-25 DIAGNOSIS — D631 Anemia in chronic kidney disease: Secondary | ICD-10-CM | POA: Diagnosis not present

## 2022-05-25 DIAGNOSIS — N2581 Secondary hyperparathyroidism of renal origin: Secondary | ICD-10-CM | POA: Diagnosis not present

## 2022-05-25 DIAGNOSIS — Z992 Dependence on renal dialysis: Secondary | ICD-10-CM | POA: Diagnosis not present

## 2022-05-27 DIAGNOSIS — D631 Anemia in chronic kidney disease: Secondary | ICD-10-CM | POA: Diagnosis not present

## 2022-05-27 DIAGNOSIS — D509 Iron deficiency anemia, unspecified: Secondary | ICD-10-CM | POA: Diagnosis not present

## 2022-05-27 DIAGNOSIS — Z992 Dependence on renal dialysis: Secondary | ICD-10-CM | POA: Diagnosis not present

## 2022-05-27 DIAGNOSIS — N2581 Secondary hyperparathyroidism of renal origin: Secondary | ICD-10-CM | POA: Diagnosis not present

## 2022-05-27 DIAGNOSIS — N186 End stage renal disease: Secondary | ICD-10-CM | POA: Diagnosis not present

## 2022-05-28 NOTE — Progress Notes (Signed)
Carotid ultrasound study shows no significant narrowing of either carotid artery in the neck

## 2022-05-29 DIAGNOSIS — N2581 Secondary hyperparathyroidism of renal origin: Secondary | ICD-10-CM | POA: Diagnosis not present

## 2022-05-29 DIAGNOSIS — N186 End stage renal disease: Secondary | ICD-10-CM | POA: Diagnosis not present

## 2022-05-29 DIAGNOSIS — D509 Iron deficiency anemia, unspecified: Secondary | ICD-10-CM | POA: Diagnosis not present

## 2022-05-29 DIAGNOSIS — D631 Anemia in chronic kidney disease: Secondary | ICD-10-CM | POA: Diagnosis not present

## 2022-05-29 DIAGNOSIS — Z992 Dependence on renal dialysis: Secondary | ICD-10-CM | POA: Diagnosis not present

## 2022-06-01 DIAGNOSIS — N186 End stage renal disease: Secondary | ICD-10-CM | POA: Diagnosis not present

## 2022-06-01 DIAGNOSIS — I2721 Secondary pulmonary arterial hypertension: Secondary | ICD-10-CM | POA: Diagnosis not present

## 2022-06-01 DIAGNOSIS — D509 Iron deficiency anemia, unspecified: Secondary | ICD-10-CM | POA: Diagnosis not present

## 2022-06-01 DIAGNOSIS — N2581 Secondary hyperparathyroidism of renal origin: Secondary | ICD-10-CM | POA: Diagnosis not present

## 2022-06-01 DIAGNOSIS — M0579 Rheumatoid arthritis with rheumatoid factor of multiple sites without organ or systems involvement: Secondary | ICD-10-CM | POA: Diagnosis not present

## 2022-06-01 DIAGNOSIS — R0982 Postnasal drip: Secondary | ICD-10-CM | POA: Diagnosis not present

## 2022-06-01 DIAGNOSIS — M359 Systemic involvement of connective tissue, unspecified: Secondary | ICD-10-CM | POA: Diagnosis not present

## 2022-06-01 DIAGNOSIS — Z992 Dependence on renal dialysis: Secondary | ICD-10-CM | POA: Diagnosis not present

## 2022-06-01 DIAGNOSIS — K5909 Other constipation: Secondary | ICD-10-CM | POA: Diagnosis not present

## 2022-06-01 DIAGNOSIS — M349 Systemic sclerosis, unspecified: Secondary | ICD-10-CM | POA: Diagnosis not present

## 2022-06-01 DIAGNOSIS — D631 Anemia in chronic kidney disease: Secondary | ICD-10-CM | POA: Diagnosis not present

## 2022-06-03 DIAGNOSIS — N186 End stage renal disease: Secondary | ICD-10-CM | POA: Diagnosis not present

## 2022-06-03 DIAGNOSIS — N2581 Secondary hyperparathyroidism of renal origin: Secondary | ICD-10-CM | POA: Diagnosis not present

## 2022-06-03 DIAGNOSIS — D631 Anemia in chronic kidney disease: Secondary | ICD-10-CM | POA: Diagnosis not present

## 2022-06-03 DIAGNOSIS — D509 Iron deficiency anemia, unspecified: Secondary | ICD-10-CM | POA: Diagnosis not present

## 2022-06-03 DIAGNOSIS — Z992 Dependence on renal dialysis: Secondary | ICD-10-CM | POA: Diagnosis not present

## 2022-06-05 DIAGNOSIS — N2581 Secondary hyperparathyroidism of renal origin: Secondary | ICD-10-CM | POA: Diagnosis not present

## 2022-06-05 DIAGNOSIS — D509 Iron deficiency anemia, unspecified: Secondary | ICD-10-CM | POA: Diagnosis not present

## 2022-06-05 DIAGNOSIS — D631 Anemia in chronic kidney disease: Secondary | ICD-10-CM | POA: Diagnosis not present

## 2022-06-05 DIAGNOSIS — N186 End stage renal disease: Secondary | ICD-10-CM | POA: Diagnosis not present

## 2022-06-05 DIAGNOSIS — Z992 Dependence on renal dialysis: Secondary | ICD-10-CM | POA: Diagnosis not present

## 2022-06-08 DIAGNOSIS — N186 End stage renal disease: Secondary | ICD-10-CM | POA: Diagnosis not present

## 2022-06-08 DIAGNOSIS — D631 Anemia in chronic kidney disease: Secondary | ICD-10-CM | POA: Diagnosis not present

## 2022-06-08 DIAGNOSIS — Z992 Dependence on renal dialysis: Secondary | ICD-10-CM | POA: Diagnosis not present

## 2022-06-08 DIAGNOSIS — N2581 Secondary hyperparathyroidism of renal origin: Secondary | ICD-10-CM | POA: Diagnosis not present

## 2022-06-08 DIAGNOSIS — D509 Iron deficiency anemia, unspecified: Secondary | ICD-10-CM | POA: Diagnosis not present

## 2022-06-09 ENCOUNTER — Ambulatory Visit: Payer: Medicare Other | Admitting: Neurology

## 2022-06-10 DIAGNOSIS — N2581 Secondary hyperparathyroidism of renal origin: Secondary | ICD-10-CM | POA: Diagnosis not present

## 2022-06-10 DIAGNOSIS — N186 End stage renal disease: Secondary | ICD-10-CM | POA: Diagnosis not present

## 2022-06-10 DIAGNOSIS — D509 Iron deficiency anemia, unspecified: Secondary | ICD-10-CM | POA: Diagnosis not present

## 2022-06-10 DIAGNOSIS — D631 Anemia in chronic kidney disease: Secondary | ICD-10-CM | POA: Diagnosis not present

## 2022-06-10 DIAGNOSIS — Z992 Dependence on renal dialysis: Secondary | ICD-10-CM | POA: Diagnosis not present

## 2022-06-11 DIAGNOSIS — Z8673 Personal history of transient ischemic attack (TIA), and cerebral infarction without residual deficits: Secondary | ICD-10-CM | POA: Diagnosis not present

## 2022-06-11 DIAGNOSIS — M349 Systemic sclerosis, unspecified: Secondary | ICD-10-CM | POA: Diagnosis not present

## 2022-06-11 DIAGNOSIS — I639 Cerebral infarction, unspecified: Secondary | ICD-10-CM | POA: Diagnosis not present

## 2022-06-11 DIAGNOSIS — N186 End stage renal disease: Secondary | ICD-10-CM | POA: Diagnosis not present

## 2022-06-11 DIAGNOSIS — K7460 Unspecified cirrhosis of liver: Secondary | ICD-10-CM | POA: Diagnosis not present

## 2022-06-11 DIAGNOSIS — K769 Liver disease, unspecified: Secondary | ICD-10-CM | POA: Diagnosis not present

## 2022-06-11 DIAGNOSIS — N269 Renal sclerosis, unspecified: Secondary | ICD-10-CM | POA: Diagnosis not present

## 2022-06-11 DIAGNOSIS — Z992 Dependence on renal dialysis: Secondary | ICD-10-CM | POA: Diagnosis not present

## 2022-06-11 DIAGNOSIS — Z8679 Personal history of other diseases of the circulatory system: Secondary | ICD-10-CM | POA: Diagnosis not present

## 2022-06-11 DIAGNOSIS — I270 Primary pulmonary hypertension: Secondary | ICD-10-CM | POA: Diagnosis not present

## 2022-06-11 DIAGNOSIS — I2721 Secondary pulmonary arterial hypertension: Secondary | ICD-10-CM | POA: Diagnosis not present

## 2022-06-12 DIAGNOSIS — D631 Anemia in chronic kidney disease: Secondary | ICD-10-CM | POA: Diagnosis not present

## 2022-06-12 DIAGNOSIS — N2581 Secondary hyperparathyroidism of renal origin: Secondary | ICD-10-CM | POA: Diagnosis not present

## 2022-06-12 DIAGNOSIS — Z992 Dependence on renal dialysis: Secondary | ICD-10-CM | POA: Diagnosis not present

## 2022-06-12 DIAGNOSIS — D509 Iron deficiency anemia, unspecified: Secondary | ICD-10-CM | POA: Diagnosis not present

## 2022-06-12 DIAGNOSIS — N186 End stage renal disease: Secondary | ICD-10-CM | POA: Diagnosis not present

## 2022-06-15 DIAGNOSIS — N2581 Secondary hyperparathyroidism of renal origin: Secondary | ICD-10-CM | POA: Diagnosis not present

## 2022-06-15 DIAGNOSIS — N186 End stage renal disease: Secondary | ICD-10-CM | POA: Diagnosis not present

## 2022-06-15 DIAGNOSIS — Z992 Dependence on renal dialysis: Secondary | ICD-10-CM | POA: Diagnosis not present

## 2022-06-15 DIAGNOSIS — D631 Anemia in chronic kidney disease: Secondary | ICD-10-CM | POA: Diagnosis not present

## 2022-06-15 DIAGNOSIS — D509 Iron deficiency anemia, unspecified: Secondary | ICD-10-CM | POA: Diagnosis not present

## 2022-06-17 DIAGNOSIS — D509 Iron deficiency anemia, unspecified: Secondary | ICD-10-CM | POA: Diagnosis not present

## 2022-06-17 DIAGNOSIS — D631 Anemia in chronic kidney disease: Secondary | ICD-10-CM | POA: Diagnosis not present

## 2022-06-17 DIAGNOSIS — Z992 Dependence on renal dialysis: Secondary | ICD-10-CM | POA: Diagnosis not present

## 2022-06-17 DIAGNOSIS — N186 End stage renal disease: Secondary | ICD-10-CM | POA: Diagnosis not present

## 2022-06-17 DIAGNOSIS — N2581 Secondary hyperparathyroidism of renal origin: Secondary | ICD-10-CM | POA: Diagnosis not present

## 2022-06-19 DIAGNOSIS — D509 Iron deficiency anemia, unspecified: Secondary | ICD-10-CM | POA: Diagnosis not present

## 2022-06-19 DIAGNOSIS — N2581 Secondary hyperparathyroidism of renal origin: Secondary | ICD-10-CM | POA: Diagnosis not present

## 2022-06-19 DIAGNOSIS — D631 Anemia in chronic kidney disease: Secondary | ICD-10-CM | POA: Diagnosis not present

## 2022-06-19 DIAGNOSIS — N186 End stage renal disease: Secondary | ICD-10-CM | POA: Diagnosis not present

## 2022-06-19 DIAGNOSIS — Z992 Dependence on renal dialysis: Secondary | ICD-10-CM | POA: Diagnosis not present

## 2022-06-20 DIAGNOSIS — N186 End stage renal disease: Secondary | ICD-10-CM | POA: Diagnosis not present

## 2022-06-20 DIAGNOSIS — I270 Primary pulmonary hypertension: Secondary | ICD-10-CM | POA: Diagnosis not present

## 2022-06-20 DIAGNOSIS — R0609 Other forms of dyspnea: Secondary | ICD-10-CM | POA: Diagnosis not present

## 2022-06-22 DIAGNOSIS — D631 Anemia in chronic kidney disease: Secondary | ICD-10-CM | POA: Diagnosis not present

## 2022-06-22 DIAGNOSIS — D509 Iron deficiency anemia, unspecified: Secondary | ICD-10-CM | POA: Diagnosis not present

## 2022-06-22 DIAGNOSIS — N186 End stage renal disease: Secondary | ICD-10-CM | POA: Diagnosis not present

## 2022-06-22 DIAGNOSIS — Z992 Dependence on renal dialysis: Secondary | ICD-10-CM | POA: Diagnosis not present

## 2022-06-22 DIAGNOSIS — N2581 Secondary hyperparathyroidism of renal origin: Secondary | ICD-10-CM | POA: Diagnosis not present

## 2022-06-24 ENCOUNTER — Emergency Department (HOSPITAL_BASED_OUTPATIENT_CLINIC_OR_DEPARTMENT_OTHER): Payer: Medicare Other

## 2022-06-24 ENCOUNTER — Observation Stay (HOSPITAL_BASED_OUTPATIENT_CLINIC_OR_DEPARTMENT_OTHER)
Admission: EM | Admit: 2022-06-24 | Discharge: 2022-06-25 | Disposition: A | Discharge status: Home / Self Care | Payer: Medicare Other | Attending: Internal Medicine | Admitting: Internal Medicine

## 2022-06-24 ENCOUNTER — Other Ambulatory Visit: Payer: Self-pay

## 2022-06-24 ENCOUNTER — Encounter (HOSPITAL_BASED_OUTPATIENT_CLINIC_OR_DEPARTMENT_OTHER): Payer: Self-pay | Admitting: Urology

## 2022-06-24 ENCOUNTER — Observation Stay (HOSPITAL_COMMUNITY): Payer: Medicare Other

## 2022-06-24 DIAGNOSIS — R9431 Abnormal electrocardiogram [ECG] [EKG]: Secondary | ICD-10-CM

## 2022-06-24 DIAGNOSIS — J309 Allergic rhinitis, unspecified: Secondary | ICD-10-CM | POA: Diagnosis not present

## 2022-06-24 DIAGNOSIS — N186 End stage renal disease: Secondary | ICD-10-CM

## 2022-06-24 DIAGNOSIS — I5032 Chronic diastolic (congestive) heart failure: Secondary | ICD-10-CM | POA: Diagnosis not present

## 2022-06-24 DIAGNOSIS — I132 Hypertensive heart and chronic kidney disease with heart failure and with stage 5 chronic kidney disease, or end stage renal disease: Secondary | ICD-10-CM | POA: Insufficient documentation

## 2022-06-24 DIAGNOSIS — Z992 Dependence on renal dialysis: Secondary | ICD-10-CM | POA: Diagnosis not present

## 2022-06-24 DIAGNOSIS — Z8673 Personal history of transient ischemic attack (TIA), and cerebral infarction without residual deficits: Secondary | ICD-10-CM | POA: Insufficient documentation

## 2022-06-24 DIAGNOSIS — K529 Noninfective gastroenteritis and colitis, unspecified: Secondary | ICD-10-CM | POA: Insufficient documentation

## 2022-06-24 DIAGNOSIS — Z79899 Other long term (current) drug therapy: Secondary | ICD-10-CM | POA: Insufficient documentation

## 2022-06-24 DIAGNOSIS — M7989 Other specified soft tissue disorders: Secondary | ICD-10-CM | POA: Diagnosis not present

## 2022-06-24 DIAGNOSIS — F411 Generalized anxiety disorder: Secondary | ICD-10-CM | POA: Diagnosis present

## 2022-06-24 DIAGNOSIS — R072 Precordial pain: Principal | ICD-10-CM | POA: Insufficient documentation

## 2022-06-24 DIAGNOSIS — K581 Irritable bowel syndrome with constipation: Secondary | ICD-10-CM | POA: Diagnosis not present

## 2022-06-24 DIAGNOSIS — K219 Gastro-esophageal reflux disease without esophagitis: Secondary | ICD-10-CM | POA: Diagnosis present

## 2022-06-24 DIAGNOSIS — R7989 Other specified abnormal findings of blood chemistry: Secondary | ICD-10-CM | POA: Diagnosis not present

## 2022-06-24 DIAGNOSIS — R1084 Generalized abdominal pain: Secondary | ICD-10-CM | POA: Diagnosis not present

## 2022-06-24 DIAGNOSIS — J301 Allergic rhinitis due to pollen: Secondary | ICD-10-CM | POA: Diagnosis not present

## 2022-06-24 DIAGNOSIS — E782 Mixed hyperlipidemia: Secondary | ICD-10-CM

## 2022-06-24 DIAGNOSIS — N2581 Secondary hyperparathyroidism of renal origin: Secondary | ICD-10-CM | POA: Diagnosis not present

## 2022-06-24 DIAGNOSIS — I272 Pulmonary hypertension, unspecified: Secondary | ICD-10-CM | POA: Diagnosis not present

## 2022-06-24 DIAGNOSIS — R778 Other specified abnormalities of plasma proteins: Secondary | ICD-10-CM | POA: Diagnosis not present

## 2022-06-24 DIAGNOSIS — D631 Anemia in chronic kidney disease: Secondary | ICD-10-CM | POA: Diagnosis not present

## 2022-06-24 DIAGNOSIS — D509 Iron deficiency anemia, unspecified: Secondary | ICD-10-CM | POA: Diagnosis not present

## 2022-06-24 DIAGNOSIS — K6389 Other specified diseases of intestine: Secondary | ICD-10-CM | POA: Diagnosis not present

## 2022-06-24 DIAGNOSIS — R0789 Other chest pain: Secondary | ICD-10-CM | POA: Diagnosis not present

## 2022-06-24 DIAGNOSIS — Z7982 Long term (current) use of aspirin: Secondary | ICD-10-CM | POA: Insufficient documentation

## 2022-06-24 DIAGNOSIS — R079 Chest pain, unspecified: Secondary | ICD-10-CM | POA: Diagnosis present

## 2022-06-24 DIAGNOSIS — M79604 Pain in right leg: Secondary | ICD-10-CM | POA: Diagnosis not present

## 2022-06-24 DIAGNOSIS — E785 Hyperlipidemia, unspecified: Secondary | ICD-10-CM | POA: Diagnosis present

## 2022-06-24 LAB — CBC WITH DIFFERENTIAL/PLATELET
Abs Immature Granulocytes: 0.02 10*3/uL (ref 0.00–0.07)
Basophils Absolute: 0 10*3/uL (ref 0.0–0.1)
Basophils Relative: 1 %
Eosinophils Absolute: 0 10*3/uL (ref 0.0–0.5)
Eosinophils Relative: 1 %
HCT: 31.8 % — ABNORMAL LOW (ref 36.0–46.0)
Hemoglobin: 9.8 g/dL — ABNORMAL LOW (ref 12.0–15.0)
Immature Granulocytes: 1 %
Lymphocytes Relative: 15 %
Lymphs Abs: 0.4 10*3/uL — ABNORMAL LOW (ref 0.7–4.0)
MCH: 26.8 pg (ref 26.0–34.0)
MCHC: 30.8 g/dL (ref 30.0–36.0)
MCV: 87.1 fL (ref 80.0–100.0)
Monocytes Absolute: 0.5 10*3/uL (ref 0.1–1.0)
Monocytes Relative: 17 %
Neutro Abs: 1.9 10*3/uL (ref 1.7–7.7)
Neutrophils Relative %: 65 %
Platelets: 123 10*3/uL — ABNORMAL LOW (ref 150–400)
RBC: 3.65 MIL/uL — ABNORMAL LOW (ref 3.87–5.11)
RDW: 18.3 % — ABNORMAL HIGH (ref 11.5–15.5)
WBC: 2.9 10*3/uL — ABNORMAL LOW (ref 4.0–10.5)
nRBC: 0 % (ref 0.0–0.2)

## 2022-06-24 LAB — COMPREHENSIVE METABOLIC PANEL
ALT: 14 U/L (ref 0–44)
AST: 21 U/L (ref 15–41)
Albumin: 3.1 g/dL — ABNORMAL LOW (ref 3.5–5.0)
Alkaline Phosphatase: 91 U/L (ref 38–126)
Anion gap: 10 (ref 5–15)
BUN: 31 mg/dL — ABNORMAL HIGH (ref 8–23)
CO2: 32 mmol/L (ref 22–32)
Calcium: 8.5 mg/dL — ABNORMAL LOW (ref 8.9–10.3)
Chloride: 95 mmol/L — ABNORMAL LOW (ref 98–111)
Creatinine, Ser: 4.2 mg/dL — ABNORMAL HIGH (ref 0.44–1.00)
GFR, Estimated: 11 mL/min — ABNORMAL LOW (ref >60)
Glucose, Bld: 96 mg/dL (ref 70–99)
Potassium: 3.6 mmol/L (ref 3.5–5.1)
Sodium: 137 mmol/L (ref 135–145)
Total Bilirubin: 0.4 mg/dL (ref 0.3–1.2)
Total Protein: 7 g/dL (ref 6.5–8.1)

## 2022-06-24 LAB — TROPONIN I (HIGH SENSITIVITY)
Troponin I (High Sensitivity): 124 ng/L (ref <18)
Troponin I (High Sensitivity): 119 ng/L (ref <18)

## 2022-06-24 LAB — LIPASE, BLOOD: Lipase: 48 U/L (ref 11–51)

## 2022-06-24 MED ORDER — ONDANSETRON HCL 4 MG/2ML IJ SOLN
4.0000 mg | Freq: Once | INTRAMUSCULAR | Status: DC
  Filled 2022-06-24: qty 2

## 2022-06-24 MED ORDER — ACETAMINOPHEN 650 MG RE SUPP: 650.0000 mg | Freq: Four times a day (QID) | RECTAL | Status: DC | PRN

## 2022-06-24 MED ORDER — FAMOTIDINE 20 MG PO TABS
20.0000 mg | ORAL_TABLET | Freq: Two times a day (BID) | ORAL | Status: DC
  Administered 2022-06-25: 20 mg via ORAL
  Filled 2022-06-24: qty 1

## 2022-06-24 MED ORDER — IOHEXOL 300 MG/ML  SOLN
80.0000 mL | Freq: Once | INTRAMUSCULAR | Status: AC | PRN
  Administered 2022-06-24: 80 mL via INTRAVENOUS

## 2022-06-24 MED ORDER — FENTANYL CITRATE PF 50 MCG/ML IJ SOSY
25.0000 ug | PREFILLED_SYRINGE | Freq: Once | INTRAMUSCULAR | Status: AC
  Administered 2022-06-24: 25 ug via INTRAVENOUS
  Filled 2022-06-24: qty 1

## 2022-06-24 MED ORDER — SODIUM CHLORIDE 0.9 % IV SOLN: INTRAVENOUS | Status: DC | PRN

## 2022-06-24 MED ORDER — MELATONIN 3 MG PO TABS: 3.0000 mg | ORAL_TABLET | Freq: Every evening | ORAL | Status: DC | PRN

## 2022-06-24 MED ORDER — CIPROFLOXACIN IN D5W 400 MG/200ML IV SOLN
400.0000 mg | Freq: Once | INTRAVENOUS | Status: AC
  Administered 2022-06-24: 400 mg via INTRAVENOUS
  Filled 2022-06-24: qty 200

## 2022-06-24 MED ORDER — ALUM & MAG HYDROXIDE-SIMETH 200-200-20 MG/5ML PO SUSP: 15.0000 mL | Freq: Four times a day (QID) | ORAL | Status: DC | PRN

## 2022-06-24 MED ORDER — PANTOPRAZOLE SODIUM 40 MG IV SOLR
40.0000 mg | Freq: Once | INTRAVENOUS | Status: AC
  Administered 2022-06-24: 40 mg via INTRAVENOUS
  Filled 2022-06-24: qty 10

## 2022-06-24 MED ORDER — ACETAMINOPHEN 325 MG PO TABS: 650.0000 mg | ORAL_TABLET | Freq: Four times a day (QID) | ORAL | Status: DC | PRN

## 2022-06-24 MED ORDER — NALOXONE HCL 0.4 MG/ML IJ SOLN: 0.4000 mg | INTRAMUSCULAR | Status: DC | PRN

## 2022-06-24 MED ORDER — ONDANSETRON HCL 4 MG/2ML IJ SOLN: 4.0000 mg | Freq: Four times a day (QID) | INTRAMUSCULAR | Status: DC | PRN

## 2022-06-24 MED ORDER — FENTANYL CITRATE PF 50 MCG/ML IJ SOSY: 12.5000 ug | PREFILLED_SYRINGE | INTRAMUSCULAR | Status: DC | PRN

## 2022-06-24 NOTE — H&P (Signed)
History and Physical      Katie Nunez:[id number] DOB: 22-Apr-1955 DOA: 06/24/2022; DOS: 06/24/2022  PCP: Merri Brunette, MD  Patient coming from: home   I have personally briefly reviewed patient's old medical records in [facility]  Chief Complaint: chest pain  HPI: Katie Nunez is a 67 y.o. female with medical history significant for end-stage renal disease on hemodialysis on Tuesday, Thursday, Saturday schedule, chronic diastolic heart failure, constipation predominant irritable bowel syndrome, GERD, generalized anxiety disorder, pulmonary hypertension, chronic pancytopenia, who is admitted to [facility] on 06/24/2022 by way of transfer from [facility] with chest pain after presenting from home to the latter facility complaining of chest pain.   The patient reports a single episode of substernal chest tightness, without radiation, which occurred on 06/24/2022.  He notes that the episode of chest discomfort was nonpositional, nonpleuritic, nonexertional, not reproducible to palpation of the anterior chest wall.  There, she reports that it resolved shortly after taking a Pepcid, and notes that she possesses an underlying history of acid reflux.  After complete resolution of this episode of chest pain following dose of Pepcid, she reports no additional chest discomfort, and confirms that she remains chest pain-free at this time.  She denies any associated shortness of breath, nausea, vomiting, diaphoresis, dizziness, or palpitations.  No recent subjective fever, chills, rigors, or generalized myalgias.  She denies any known history of underlying coronary artery disease, but admits that she has not recently undergone any coronary ischemic evaluation.  She notes a medical history that includes hyperlipidemia.  Denies any personal history of DVT or PE.  No recent trauma, travel, surgery, or periods of extended diminished ambulatory status.  She notes some worsening  of edema in the right lower extremity, in the context of her known history of chronic right-sided systolic heart failure.   Of note, she carries a diagnosis of chronic diastolic heart failure as well as chronic right-sided systolic failure, with most recent echocardiogram performed in December 2021, which is notable for LVEF 65 to 70%, grade 2 diastolic dysfunction, moderately reduced right ventricular systolic function.  She also conveys a history of constipation predominant irritable bowel syndrome, significant constipation for over a year.  In this context, she reports that she is mainly able to evacuate her bowels only manual disimpaction, which she typically performs daily.  She notes that her current oral bowel regimen includes Colace, and she is also on lubiprostone for her constipation predominant irritable bowel syndrome.  In the setting of chronic constipation, she reports chronic abdominal bloating.     ED Course:  Vital signs in the ED were notable for the following: Afebrile; heart rate in the range 86-93 7.  Blood pressures in the low 100s to 1 teens; respiratory rate 18-23, oxygen saturation 94 to 100% on room air.  Labs were notable for the following: CMP, which is notable for sodium 137, potassium 3.6, glucose 96, calcium, just for mild hypoalbuminemia noted to be 9.3, abdomen 3.1, otherwise, liver enzymes were within normal limits.  Lipase 48.  High-sensitivity troponin I initially noted to be 124, with repeat value trending down to 119.  This is in the context of no prior high-sensitivity troponin I values available for reported comparison.  CBC notable for will with cell count 2900 relative demonstration.  Will with cell count of 2500 on 04/12/2022, a bilirubin 9.8 cystic there is a/recurrent properties, and relative dimensions prior hemoglobin data point of 9.6 on 04/12/2022,  platelet count 123, relative demonstration prior platelet data point of 138 on 04/12/2022.  Per my  interpretation, EKG in ED demonstrated the following: Sinus rhythm with heart rate 87, QTc prolongation of 493, and no evidence of T wave or ST changes, including no evidence of ST elevation.  Imaging and additional notable ED work-up: CT abdomen/pelvis, which showed wall thickening in the ascending colon, potentially representing proximal colitis, without any evidence of obstruction, abscess, or perforation.  EDP at [facility] discussed patient's case with on-call cardiology, Dr. Wyline Mood, who recommended [facility] admission for further chest pain workup, including further trending of troponin, echocardiogram in the morning, and conveyed that cardiology will formally consult and see the patient in the morning.  Currently chest pain-free.   While in the ED, the following were administered: Fentanyl 25 mcg IV x 1 dose, Protonix 40 mg IV x 1 dose, ciprofloxacin x 1 dose.  Subsequently, the patient was admitted to [facility] for further evaluation management of presenting atypical chest pain.     Review of Systems: As per HPI otherwise 10 point review of systems negative.   Past Medical History:  Diagnosis Date   Achalasia    Anxiety    Dysplasia of cervix, low grade (CIN 1)    Environmental allergies    "all year long" (12/27/2016)   ESRD (end stage renal disease) on dialysis (HCC)    "TTS; [place]" (12/27/2016)   Fibromyalgia    GERD (gastroesophageal reflux disease)    Gout    Hypertension    IBS (irritable bowel syndrome)    MVP (mitral valve prolapse)    RA (rheumatoid arthritis) (HCC)    FOLLOWED BY DR. SHANAHAN   Raynaud's disease    Scleroderma (HCC)    Seasonal allergies    Thrombocytopenia (HCC) 07/01/2016   Acute fall to 13,000 07/01/16   Tubular adenoma 01/08/2008   CECUM   Vitamin D deficiency     Past Surgical History:  Procedure Laterality Date   ANKLE FRACTURE SURGERY Right    AV FISTULA PLACEMENT Left 06/28/2016   Procedure: left arm ARTERIOVENOUS (AV) FISTULA CREATION;   Surgeon: Larina Earthly, MD;  Location: [facility] OR;  Service: Vascular;  Laterality: Left;   BASCILIC VEIN TRANSPOSITION Left 09/27/2016   Procedure: LEFT UPPER ARM CEPHALIC VEIN TRANSPOSITION;  Surgeon: Larina Earthly, MD;  Location: [facility] OR;  Service: Vascular;  Laterality: Left;   BREAST BIOPSY     "? side"   CESAREAN SECTION  1994   CO2 LASER OF CERVIX     COLONOSCOPY W/ BIOPSIES  01/08/2008   INSERTION OF DIALYSIS CATHETER Right 06/28/2016   Procedure: INSERTION OF DIALYSIS CATHETER, right internal jugular;  Surgeon: Larina Earthly, MD;  Location: [facility] OR;  Service: Vascular;  Laterality: Right;   MYOMECTOMY     NASAL ENDOSCOPY WITH EPISTAXIS CONTROL N/A 12/29/2019   Procedure: NASAL ENDOSCOPY WITH EPISTAXIS CONTROL;  Surgeon: Newman Pies, MD;  Location: [facility] OR;  Service: ENT;  Laterality: N/A;   PELVIC LAPAROSCOPY  2011   superficial thrombophlebitis Left 07-2014    Social History:  reports that she has never smoked. She has never used smokeless tobacco. She reports that she does not drink alcohol and does not use drugs.   Allergies  Allergen Reactions   Other Anaphylaxis and Other (See Comments)    Do not use polyflux membrane.  Use alternate Other reaction(s): heart racing   Savella [Milnacipran Hcl] Palpitations and Other (See Comments)    Unknown  Tape Rash and Other (See Comments)    Itch- unsure if it was paper or adhesive tape    Family History  Problem Relation Age of Onset   Hypertension Mother    Diabetes Mother    Heart disease Father    Hypertension Maternal Aunt    Diabetes Maternal Grandmother    Heart disease Paternal Grandfather    Cerebral palsy Cousin        1ST COUSIN?   Diabetes Paternal Grandmother     Family history reviewed and not pertinent    Prior to Admission medications   Medication Sig Start Date End Date Taking? Authorizing Provider  albuterol (VENTOLIN HFA) 108 (90 Base) MCG/ACT inhaler Inhale 2 puffs into the lungs every 6 (six) hours as needed  for wheezing or shortness of breath. 12/02/21   Leslye Peer, MD  ambrisentan (LETAIRIS) 5 MG tablet Take 10 mg by mouth daily.    [provider]  aspirin EC 81 MG tablet Take 1 tablet (81 mg total) by mouth daily. Swallow whole. 08/04/20   Micki Riley, MD  atorvastatin (LIPITOR) 80 MG tablet Take 1 tablet (80 mg total) by mouth daily. 06/18/21   Micki Riley, MD  azelastine (ASTELIN) 0.1 % nasal spray Place 1 spray into both nostrils 2 (two) times daily as needed for rhinitis. Use in each nostril as directed 01/23/20   Love, Evlyn Kanner, PA-C  benzonatate (TESSALON) 100 MG capsule Take 1 capsule (100 mg total) by mouth every 8 (eight) hours. 03/24/22   Sloan Leiter, DO  calcium acetate (PHOSLO) 667 MG capsule Take by mouth. 05/01/20   [provider]  camphor-menthol Wynelle Fanny) lotion Apply topically 2 (two) times daily. 01/23/20   Love, Evlyn Kanner, PA-C  clonazePAM (KLONOPIN) 0.5 MG tablet Take 1 tablet (0.5 mg total) by mouth 2 (two) times daily as needed for anxiety. 01/23/20   Love, Evlyn Kanner, PA-C  co-enzyme Q-10 30 MG capsule Take 7 capsules (210 mg total) by mouth 3 (three) times daily. 06/10/21   Micki Riley, MD  cyclobenzaprine (FLEXERIL) 5 MG tablet Take 1 tablet (5 mg total) by mouth every 8 (eight) hours as needed for muscle spasms. 11/25/21   Delories Heinz, DPM  docusate sodium (COLACE) 100 MG capsule Take 2 capsules (200 mg total) by mouth daily. 01/23/20   Love, Evlyn Kanner, PA-C  doxycycline (VIBRA-TABS) 100 MG tablet Take 100 mg by mouth 2 (two) times daily. 07/16/21   [provider]  famotidine (PEPCID) 20 MG tablet Take 1 tablet (20 mg total) by mouth daily as needed for heartburn or indigestion. Patient taking differently: Take 40 mg by mouth 2 (two) times daily. 01/23/20   Love, Evlyn Kanner, PA-C  fluticasone (FLONASE) 50 MCG/ACT nasal spray Place 1 spray into both nostrils daily for 7 days. 03/24/22 03/31/22  Sloan Leiter, DO  folic acid (FOLVITE) 1 MG  tablet Take 1 tablet (1 mg total) by mouth daily. 04/20/22   Jaci Standard, MD  gabapentin (NEURONTIN) 100 MG capsule Take 1 capsule (100 mg total) by mouth 3 (three) times daily. Take 1 capsule in morning and two at night 04/28/22   Micki Riley, MD  hydrocerin (EUCERIN) CREA Apply 1 application topically 2 (two) times daily. To dry skin--avoid IV site 01/23/20   Love, Evlyn Kanner, PA-C  hydrocortisone (ANUSOL-HC) 2.5 % rectal cream Place 1 Application rectally 2 (two) times daily. 04/20/22   Pollyann Savoy, MD  hydrOXYzine (ATARAX/VISTARIL) 25 MG tablet Take 25 mg by mouth every 8 (eight) hours as needed for itching.  06/22/19   [provider]  levocetirizine (XYZAL) 5 MG tablet SMARTSIG:1 Tablet(s) By Mouth Every Evening 04/15/20   [provider]  lidocaine-prilocaine (EMLA) cream Apply 1 application topically as needed. Apply to toes as needed for pain 03/07/20   Delories Heinz, DPM  lubiprostone (AMITIZA) 8 MCG capsule Take 8 mcg by mouth 2 (two) times daily with a meal.    [provider]  multivitamin (RENA-VIT) TABS tablet Take 1 tablet by mouth daily at 6 (six) AM. 01/24/20   Love, Evlyn Kanner, PA-C  nitroGLYCERIN (NITRO-DUR) 0.2 mg/hr patch Place 1 patch (0.2 mg total) onto the skin daily. Apply near great toes bilateral to top of foot every morning.  May remove at night 11/25/21   Delories Heinz, DPM  oxymetazoline (AFRIN) 0.05 % nasal spray Place 1 spray into both nostrils 2 (two) times daily as needed for congestion. 01/22/20   Love, Evlyn Kanner, PA-C  Selexipag (UPTRAVI) 800 MCG TABS Take 1 tablet (800 mcg total) by mouth in the morning and at bedtime. 01/23/20   Love, Evlyn Kanner, PA-C  sodium chloride (OCEAN) 0.65 % SOLN nasal spray Place 1 spray into both nostrils 5 (five) times daily. At least 5 times a day 01/22/20   Love, Evlyn Kanner, PA-C  sodium chloride (OCEAN) 0.65 % SOLN nasal spray Place 1 spray into both nostrils as needed for congestion. 03/24/22   Sloan Leiter, DO  vitamin B-12 (CYANOCOBALAMIN) 100 MCG tablet Take 1 tablet (100 mcg total) by mouth daily. 04/20/22   Jaci Standard, MD     Objective    Physical Exam: Vitals:   06/24/22 2000 06/24/22 2030 06/24/22 2100 06/24/22 2245  BP: 115/64 101/63 105/66 114/76  Pulse: 86 93 88 (!) 106  Resp: (!) 21 (!) 23 (!) 21 20  Temp:   98.7 F (37.1 C) 98.5 F (36.9 C)  TempSrc:   Oral Oral  SpO2: 100% 100% 100% 100%  Weight:    56.2 kg  Height:    5\' 5"  (1.651 m)    General: appears to be stated age; alert, oriented Skin: warm, dry, no rash Head:  AT/[place] Mouth:  Oral mucosa membranes appear moist, normal dentition Neck: supple; trachea midline Heart:  RRR; did not appreciate any M/R/G Lungs: CTAB, did not appreciate any wheezes, rales, or rhonchi Abdomen: + BS; soft, mild distention Vascular: 2+ pedal pulses b/l; 2+ radial pulses b/l Extremities: no peripheral edema, no muscle wasting Neuro: strength and sensation intact in upper and lower extremities b/l     Labs on Admission: I have personally reviewed following labs and imaging studies  CBC: Recent Labs  Lab 06/24/22 1459  WBC 2.9*  NEUTROABS 1.9  HGB 9.8*  HCT 31.8*  MCV 87.1  PLT 123*   Basic Metabolic Panel: Recent Labs  Lab 06/24/22 1459  NA 137  K 3.6  CL 95*  CO2 32  GLUCOSE 96  BUN 31*  CREATININE 4.20*  CALCIUM 8.5*   GFR: Estimated Creatinine Clearance: 12 mL/min (A) (by C-G formula based on SCr of 4.2 mg/dL (H)). Liver Function Tests: Recent Labs  Lab 06/24/22 1459  AST 21  ALT 14  ALKPHOS 91  BILITOT 0.4  PROT 7.0  ALBUMIN 3.1*   Recent Labs  Lab 06/24/22 1459  LIPASE 48   No results for input(s): "AMMONIA" in the last  168 hours. Coagulation Profile: No results for input(s): "INR", "PROTIME" in the last 168 hours. Cardiac Enzymes: No results for input(s): "CKTOTAL", "CKMB", "CKMBINDEX", "TROPONINI" in the last 168 hours. BNP (last 3 results) No results for input(s):  "PROBNP" in the last 8760 hours. HbA1C: No results for input(s): "HGBA1C" in the last 72 hours. CBG: No results for input(s): "GLUCAP" in the last 168 hours. Lipid Profile: No results for input(s): "CHOL", "HDL", "LDLCALC", "TRIG", "CHOLHDL", "LDLDIRECT" in the last 72 hours. Thyroid Function Tests: No results for input(s): "TSH", "T4TOTAL", "FREET4", "T3FREE", "THYROIDAB" in the last 72 hours. Anemia Panel: No results for input(s): "VITAMINB12", "FOLATE", "FERRITIN", "TIBC", "IRON", "RETICCTPCT" in the last 72 hours. Urine analysis:    Component Value Date/Time   COLORURINE YELLOW 06/25/2016 1745   APPEARANCEUR HAZY (A) 06/25/2016 1745   LABSPEC 1.009 06/25/2016 1745   PHURINE 5.0 06/25/2016 1745   GLUCOSEU NEGATIVE 06/25/2016 1745   HGBUR SMALL (A) 06/25/2016 1745   BILIRUBINUR NEGATIVE 06/25/2016 1745   KETONESUR NEGATIVE 06/25/2016 1745   PROTEINUR >=300 (A) 06/25/2016 1745   NITRITE NEGATIVE 06/25/2016 1745   LEUKOCYTESUR NEGATIVE 06/25/2016 1745    Radiological Exams on Admission: CT ABDOMEN PELVIS W CONTRAST  Result Date: 06/24/2022 CLINICAL DATA:  Abdominal pain. Acid reflux and constipation. Hemodialysis today. EXAM: CT ABDOMEN AND PELVIS WITH CONTRAST TECHNIQUE: Multidetector CT imaging of the abdomen and pelvis was performed using the standard protocol following bolus administration of intravenous contrast. RADIATION DOSE REDUCTION: This exam was performed according to the departmental dose-optimization program which includes automated exposure control, adjustment of the mA and/or kV according to patient size and/or use of iterative reconstruction technique. CONTRAST:  80mL OMNIPAQUE IOHEXOL 300 MG/ML  SOLN COMPARISON:  06/06/2019 FINDINGS: Lower chest: Mild nonspecific airspace opacity in the lingula on the scout image, the lower portion of this is primarily due to volume loss/atelectasis on image 1 series 3. There is some bandlike atelectasis or scarring in the right lower  lobe as well. Moderate cardiomegaly. Descending thoracic aortic atherosclerosis. Suspected distal esophageal varices. Hepatobiliary: Hepatic cirrhosis. Borderline gallbladder wall thickening. Pancreas: Unremarkable Spleen: Unremarkable Adrenals/Urinary Tract: Bilateral renal atrophy. Hypodense lesion too small to characterize in the right kidney upper pole is likely a cyst. No further imaging workup of this lesion is indicated. Adrenal glands unremarkable. Stomach/Bowel: Borderline wall thickening in proximal loops of small bowel without overt fold effacement. No dilated bowel observed. Wall thickening is present in the ascending colon suspicious for proximal colitis. Vascular/Lymphatic: Atherosclerosis is present, including aortoiliac atherosclerotic disease. Numerous small retroperitoneal lymph nodes are once again identified. Index right common iliac node measures 1.0 cm in short axis on image forty-five series 2, previously 1.2 cm. Suspected small lymph nodes along the splenic hilum up to 1.4 cm in diameter on image 19 series 2, formerly same. Pulmonary venous hypertension with some splenorenal shunting and portosystemic shunting around the rectum. Reproductive: Calcified uterine fibroids. Other: There is some mild chronic right retroperitoneal nodularity within index lesion 0.6 cm in short axis on image 38 series 2, formerly 1.0 cm on 06/06/2019. Chronic hazy edema in the root of the mesentery. Trace perihepatic ascites, reduced from the prior exam. Musculoskeletal: Unremarkable IMPRESSION: 1. Wall thickening in the ascending colon suspicious for proximal colitis. 2. Borderline wall thickening in proximal loops of small bowel without overt fold effacement. This could be from portal hypertension or enteritis. 3. Hepatic cirrhosis with portal venous hypertension and distal esophageal varices. 4. Trace perihepatic ascites, reduced from the prior exam. 5.  Chronic hazy edema in the root of the mesentery. 6. Numerous  small retroperitoneal lymph nodes are once again identified, some of which are mildly enlarged but stable to slightly smaller than on the prior exam. Mildly enlarged lymph nodes along the splenic hilum, probably chronic reactive lymph nodes, and similar to 2021. 7. Calcified uterine fibroids. 8. Aortic atherosclerosis. Aortic Atherosclerosis (ICD10-I70.0). Electronically Signed   By: Gaylyn Rong M.D.   On: 06/24/2022 17:00   US Venous Img Lower Unilateral Right  Result Date: 06/24/2022 CLINICAL DATA:  Right leg swelling and pain EXAM: Right LOWER EXTREMITY VENOUS DOPPLER ULTRASOUND TECHNIQUE: Gray-scale sonography with compression, as well as color and duplex ultrasound, were performed to evaluate the deep venous system(s) from the level of the common femoral vein through the popliteal and proximal calf veins. COMPARISON:  Ultrasound 2007 FINDINGS: VENOUS Normal compressibility of the common femoral, superficial femoral, and popliteal veins, as well as the visualized calf veins. Visualized portions of profunda femoral vein and great saphenous vein unremarkable. No filling defects to suggest DVT on grayscale or color Doppler imaging. Doppler waveforms show normal direction of venous flow, normal respiratory plasticity and response to augmentation. Limited views of the contralateral common femoral vein are unremarkable. OTHER Small cystic focus in the popliteal fossa measuring 2.8 x 0.9 x 2.1 cm. No abnormal blood flow on Doppler. Limitations: none IMPRESSION: No evidence of right lower extremity DVT. Cystic lesion in the popliteal fossa measuring 2.8 cm. Possible Baker's cyst. Please correlate for any known history or dedicated evaluation as clinically appropriate Electronically Signed   By: Karen Kays M.D.   On: 06/24/2022 16:55      Assessment/Plan   Principal Problem:   Atypical chest pain Active Problems:   Pulmonary hypertension (HCC)   Irritable bowel syndrome   End-stage renal disease  on hemodialysis (HCC)   Allergic rhinitis   Colitis   Elevated troponin   Prolonged QT interval   Chronic diastolic CHF (congestive heart failure) (HCC)   GERD (gastroesophageal reflux disease)   GAD (generalized anxiety disorder)   Hyperlipidemia      #) Atypical Chest Pain: 1 episode of nonradiating substernal chest tightness that was nonexertional, and resolved completely without recurrence after dose of Pepcid, which appears to be atypical for ACS; however, given the presence of some typical characteristics in this patient with multiple CAD risk factors, no prior cardiac ischemic evaluation, and a presenting HEART score of 4 conveying a moderate risk of major acute cardiac event over the ensuing 6 weeks, the decision was made to admit patient for overnight observation in order to rule-out ACS.   Of note, Troponin was mildly elevated at 124, with ensuing troponin trending down to 119.  Interpretation of patient's mildly elevated but downward trending troponin is complicated by significant decrease in renal clearance in the context of her end-stage renal disease on hemodialysis.  Of note, chest x-ray was not pursued at [facility].  Will check chest x-ray at this time to further evaluate her presenting chest discomfort.  EKG showed no evidence of acute ischemic changes, including no evidence of STEMI. patient currently CP free.   EDP at [facility] discussed patient's case with on-call cardiology, Dr. Wyline Mood, who recommended [facility] admission for further chest pain workup, including further trending of troponin, echocardiogram in the morning, and conveyed that cardiology will formally consult and see the patient in the morning.    Aside from ACS, which appears clinically less likely at the present time, differential  also includes non-cardiac etiologies including musculoskeletal possibilities such as costochondritis, GI sources including GERD vs esophagitis vs gastritis vs PUD vs esophageal  spasm, as well as anxiety, while noting that the patient has documented history of GERD as well as GAD. presentation is clinically less suggestive of acute PE.  Of note, today CT abdomen/pelvis showed no evidence of acute pancreatitis, consistent with nonelevated presenting lipase, as quantified above.   Plan: trend serial troponin. Monitor on telemetry. PRN IV fentanyl. PRN EKG for subsequent episodes of chest pain. Check serum Mg level and repeat BMP in the morning. Repeat CBC in the AM.  Full dose aspirin x 1 now.  Resume home high intensity atorvastatin for plaque stabilization qualities.  Echocardiogram ordered for the morning.  Resume outpatient prn clonazepam.  Schedule Outpatient prn Pepcid.  Cardiology to formally consult and see the patient in the morning, as further detailed above.  Chest x-ray x 1 now.            #) Proximal colitis: In the setting of the patient's chronic abdominal bloating and today's episode chest discomfort, CT abdomen/pelvis was pursued in the ED and showed some evidence of wall thickening in the ascending colon, which was felt to be potentially representative of proximal colitis, in the absence of any evidence of bowel obstruction, abscess, or perforation.  Of note, she has not had any recent diarrhea, but rather continues with her chronic constipation in the context of a documented history of constipation predominant irritable bowel syndrome, and the possibility of atypical sterile Colo colitis given the degree of her constant constipation, although atypical for the anatomical distribution of such.  Infectious colitis appears less likely at this time.  Additionally, ischemic colitis appears less likely as the distribution of her wall thickening does not appear to be consistent with that of watershed distribution, nor is she had any recent bright red blood per rectum.  She has no known history of inflammatory colitis.  Will add on anti saccharomyces cerevisiae abs to  further assess along with generalized inflammatory markers, as further detailed below.  Given her history of rheumatologic illness, autoimmune colitis is also a possibility.  It appears that her most recent colonoscopy was back in 2009.  Plan: anti saccharomyces cerevisiae abs , as above.  Check ESR, CRP.  Continue outpatient scheduled Colace and lubiprostone.  Add prn MiraLAX for constipation.  Prn IV fentanyl.  Repeat CMP, CBC in the morning.               #) QTc prolongation: Presenting EKG demonstrates QTc of 493 ms.  Plan: Monitor on telemetry.  Add-on serum magnesium level.  Repeat EKG in the morning to monitor interval degree of QTc prolongation.                     #) ESRD: on HD (schedule: Tuesday, Thursday, Saturday).  Most recently completed hemodialysis session occurred on Thursday, 06/24/2022 next due for routine HD on Saturday, 06/26/2022. No clinical evidence for urgent overnight HD or to expedite HD relative to this timeframe.   Plan: monitor strict I's/O's, daily weights. CMP in the AM. Check mag and phos levels.  Continue outpatient PhosLo.                        #) Chronic diastolic heart failure: documented history of such, with most recent echocardiogram performed December 2021, which is notable for LVEF 65 to 70% as well as grade 2 diastolic/,  also demonstrating moderately reduced right ventricular systolic function in the context of her underlying pulmonary hypertension. No clinical evidence to suggest acutely decompensated heart failure at this time. home diuretic regimen reportedly consists of the following: None.    Plan: monitor strict I's & O's and daily weights. Repeat CMP in AM. Check serum mag level.  Will follow for results of echocardiogram, which has been ordered for the morning to further evaluate presenting episode of chest pain.              #) Constipation predominant irritable bowel syndrome:  Documented history of such, on lubiprostone as an outpatient.  She notes associated chronic constipation, requiring daily manual disimpaction in order to promote bowel evacuation.  Outpatient oral bowel regimen also includes scheduled Colace.  There may be some room for optimization in terms of her outpatient oral bowel regimen given the nature of her chronic constipation, which may be playing a role in contributing to some of the wall thickening seen in the ascending colon as identified above.  Plan: Continue outpatient naproxen as well as scheduled Colace.  Add prn MiraLAX.  May consider addition of bowel stimulant as well.                 #) GERD: documented h/o such; on prn Pepcid as outpatient.  As the patient's chest pain resolved with dose of prn Pepcid, will proceed with scheduling of her outpatient prn Pepcid at this time.  Plan: continue home Pepcid, but escalate to scheduled dosing.                    #) Generalized anxiety disorder: documented h/o such. On prn clonazepam as outpatient in the absence of any scheduled SSRI or SNRI, which is notable given the patient's concomitant presenting QTc prolongation.    Plan: Continue outpatient prn clonazepam.                  #) Allergic Rhinitis: documented h/o such, on scheduled intranasal Flonase as well as Xyzal as outpatient.    Plan: cont home Flonase and Xyzal.                    #) Hyperlipidemia: documented h/o such. On high intensity atorvastatin as outpatient.   Plan: continue home statin.                 #) Pulmonary hypertension: Documented history of such, as a part of her underlying rheumatologic pathology.  She is on Ambrisentan and Selexipag as an outpatient.   Plan: resume home Ambrisentan and Selexipag.       DVT prophylaxis: SCD's   Code Status: Full code Family Communication: none Disposition Plan: Per Rounding Team Consults called:  EDP at [facility] d/w patient's case with on-call cardiology, Dr. Wyline Mood, who conveyed that cardiology will formally consult to the patient in the morning, as further detailed above;  Admission status: Observation     I SPENT GREATER THAN 75  MINUTES IN CLINICAL CARE TIME/MEDICAL DECISION-MAKING IN COMPLETING THIS ADMISSION.      Chaney Born Tenee Wish DO [facility]  From 7PM - 7AM   06/24/2022, 11:43 PM

## 2022-06-24 NOTE — ED Triage Notes (Signed)
Pt in wheelchair with oxygen 3L to triage  States just left dialysis PTA  States has been having acid reflux and is having constipation Is having Bms but is having to manually get it out per normal  Denies pain    H/o stroke to right side

## 2022-06-24 NOTE — ED Notes (Signed)
[facility] called for transport @21 :19

## 2022-06-24 NOTE — ED Notes (Signed)
Date and time results received: 06/24/22 1540 (use smartphrase ".now" to insert current time)  Test: troponin Critical Value: 124  Name of Provider Notified: Fredricka Bonine PA  Orders Received? Or Actions Taken?: Obtain EKG

## 2022-06-24 NOTE — ED Notes (Signed)
Patient transported to CT 

## 2022-06-24 NOTE — ED Provider Notes (Signed)
[place] [facility] AT [facility] Provider Note   CSN: [id number] Arrival date & time: 06/24/22  1401     History Chief Complaint  Patient presents with   Constipation   Gastroesophageal Reflux    Katie Nunez is a 67 y.o. female with history of scleroderma, irritable bowel syndrome, hypertension, reflux, dialysis Tuesday Thursday Saturday who presents to the emergency department with constipation and chest tightness.  She states that constipation has been ongoing for a couple of years but has been getting progressively worsening.  She states that she has to manually disimpact herself daily to get relief.  She has not had a full bowel movement in some time.  She is having associated bloating and diffuse abdominal pain.  In addition, patient is complaining of central substernal chest tightness and some epigastric pain.  She denies any epigastric burning.  She states that her symptoms are somewhat relieved with Pepcid but she takes this infrequently.  She denies any shortness of breath.  She also is complaining of right leg pain and swelling. Denies rectal pain.   Constipation Gastroesophageal Reflux       Home Medications Prior to Admission medications   Medication Sig Start Date End Date Taking? Authorizing Provider  albuterol (VENTOLIN HFA) 108 (90 Base) MCG/ACT inhaler Inhale 2 puffs into the lungs every 6 (six) hours as needed for wheezing or shortness of breath. 12/02/21   Leslye Peer, MD  ambrisentan (LETAIRIS) 5 MG tablet Take 10 mg by mouth daily.    [provider]  aspirin EC 81 MG tablet Take 1 tablet (81 mg total) by mouth daily. Swallow whole. 08/04/20   Micki Riley, MD  atorvastatin (LIPITOR) 80 MG tablet Take 1 tablet (80 mg total) by mouth daily. 06/18/21   Micki Riley, MD  azelastine (ASTELIN) 0.1 % nasal spray Place 1 spray into both nostrils 2 (two) times daily as needed for rhinitis. Use in each nostril as directed  01/23/20   Love, Evlyn Kanner, PA-C  benzonatate (TESSALON) 100 MG capsule Take 1 capsule (100 mg total) by mouth every 8 (eight) hours. 03/24/22   Sloan Leiter, DO  calcium acetate (PHOSLO) 667 MG capsule Take by mouth. 05/01/20   [provider]  camphor-menthol Wynelle Fanny) lotion Apply topically 2 (two) times daily. 01/23/20   Love, Evlyn Kanner, PA-C  clonazePAM (KLONOPIN) 0.5 MG tablet Take 1 tablet (0.5 mg total) by mouth 2 (two) times daily as needed for anxiety. 01/23/20   Love, Evlyn Kanner, PA-C  co-enzyme Q-10 30 MG capsule Take 7 capsules (210 mg total) by mouth 3 (three) times daily. 06/10/21   Micki Riley, MD  cyclobenzaprine (FLEXERIL) 5 MG tablet Take 1 tablet (5 mg total) by mouth every 8 (eight) hours as needed for muscle spasms. 11/25/21   Delories Heinz, DPM  docusate sodium (COLACE) 100 MG capsule Take 2 capsules (200 mg total) by mouth daily. 01/23/20   Love, Evlyn Kanner, PA-C  doxycycline (VIBRA-TABS) 100 MG tablet Take 100 mg by mouth 2 (two) times daily. 07/16/21   [provider]  famotidine (PEPCID) 20 MG tablet Take 1 tablet (20 mg total) by mouth daily as needed for heartburn or indigestion. Patient taking differently: Take 40 mg by mouth 2 (two) times daily. 01/23/20   Love, Evlyn Kanner, PA-C  fluticasone (FLONASE) 50 MCG/ACT nasal spray Place 1 spray into both nostrils daily for 7 days. 03/24/22 03/31/22  Sloan Leiter, DO  folic  acid (FOLVITE) 1 MG tablet Take 1 tablet (1 mg total) by mouth daily. 04/20/22   Jaci Standard, MD  gabapentin (NEURONTIN) 100 MG capsule Take 1 capsule (100 mg total) by mouth 3 (three) times daily. Take 1 capsule in morning and two at night 04/28/22   Micki Riley, MD  hydrocerin (EUCERIN) CREA Apply 1 application topically 2 (two) times daily. To dry skin--avoid IV site 01/23/20   Love, Evlyn Kanner, PA-C  hydrocortisone (ANUSOL-HC) 2.5 % rectal cream Place 1 Application rectally 2 (two) times daily. 04/20/22   Pollyann Savoy, MD  hydrOXYzine  (ATARAX/VISTARIL) 25 MG tablet Take 25 mg by mouth every 8 (eight) hours as needed for itching.  06/22/19   [provider]  levocetirizine (XYZAL) 5 MG tablet SMARTSIG:1 Tablet(s) By Mouth Every Evening 04/15/20   [provider]  lidocaine-prilocaine (EMLA) cream Apply 1 application topically as needed. Apply to toes as needed for pain 03/07/20   Delories Heinz, DPM  lubiprostone (AMITIZA) 8 MCG capsule Take 8 mcg by mouth 2 (two) times daily with a meal.    [provider]  multivitamin (RENA-VIT) TABS tablet Take 1 tablet by mouth daily at 6 (six) AM. 01/24/20   Love, Evlyn Kanner, PA-C  nitroGLYCERIN (NITRO-DUR) 0.2 mg/hr patch Place 1 patch (0.2 mg total) onto the skin daily. Apply near great toes bilateral to top of foot every morning.  May remove at night 11/25/21   Delories Heinz, DPM  oxymetazoline (AFRIN) 0.05 % nasal spray Place 1 spray into both nostrils 2 (two) times daily as needed for congestion. 01/22/20   Love, Evlyn Kanner, PA-C  Selexipag (UPTRAVI) 800 MCG TABS Take 1 tablet (800 mcg total) by mouth in the morning and at bedtime. 01/23/20   Love, Evlyn Kanner, PA-C  sodium chloride (OCEAN) 0.65 % SOLN nasal spray Place 1 spray into both nostrils 5 (five) times daily. At least 5 times a day 01/22/20   Love, Evlyn Kanner, PA-C  sodium chloride (OCEAN) 0.65 % SOLN nasal spray Place 1 spray into both nostrils as needed for congestion. 03/24/22   Sloan Leiter, DO  vitamin B-12 (CYANOCOBALAMIN) 100 MCG tablet Take 1 tablet (100 mcg total) by mouth daily. 04/20/22   Jaci Standard, MD      Allergies    Other, Tery Sanfilippo hcl], and Tape    Review of Systems   Review of Systems  Gastrointestinal:  Positive for constipation.  All other systems reviewed and are negative.   Physical Exam Updated Vital Signs BP 110/62 (BP Location: Right Arm)   Pulse 89   Temp 98.7 F (37.1 C) (Oral)   Resp (!) 23   Ht 5\' 5"  (1.651 m)   Wt 59.1 kg   LMP 11/15/2008   SpO2  100%   BMI 21.68 kg/m  Physical Exam Vitals and nursing note reviewed.  Constitutional:      General: She is not in acute distress.    Appearance: Normal appearance.  HENT:     Head: Normocephalic and atraumatic.  Eyes:     General:        Right eye: No discharge.        Left eye: No discharge.  Cardiovascular:     Comments: Regular rate and rhythm.  S1/S2 are distinct without any evidence of murmur, rubs, or gallops.  Pulmonary:     Comments: Clear to auscultation bilaterally.  Normal effort.  No respiratory distress.  No evidence  of wheezes, rales, or rhonchi heard throughout. Abdominal:     General: Abdomen is flat. Bowel sounds are normal. There is no distension.     Tenderness: There is generalized abdominal tenderness. There is no guarding or rebound.  Musculoskeletal:        General: Normal range of motion.     Cervical back: Neck supple.     Comments: Right leg is somewhat erythematous, red, and swollen to palpation.  2+ results pedis pulses felt bilaterally.  Skin:    General: Skin is warm and dry.     Findings: No rash.  Neurological:     General: No focal deficit present.     Mental Status: She is alert.  Psychiatric:        Mood and Affect: Mood normal.        Behavior: Behavior normal.     ED Results / Procedures / Treatments   Labs (all labs ordered are listed, but only abnormal results are displayed) Labs Reviewed  CBC WITH DIFFERENTIAL/PLATELET - Abnormal; Notable for the following components:      Result Value   WBC 2.9 (*)    RBC 3.65 (*)    Hemoglobin 9.8 (*)    HCT 31.8 (*)    RDW 18.3 (*)    Platelets 123 (*)    Lymphs Abs 0.4 (*)    All other components within normal limits  COMPREHENSIVE METABOLIC PANEL - Abnormal; Notable for the following components:   Chloride 95 (*)    BUN 31 (*)    Creatinine, Ser 4.20 (*)    Calcium 8.5 (*)    Albumin 3.1 (*)    GFR, Estimated 11 (*)    All other components within normal limits  TROPONIN I (HIGH  SENSITIVITY) - Abnormal; Notable for the following components:   Troponin I (High Sensitivity) 124 (*)    All other components within normal limits  TROPONIN I (HIGH SENSITIVITY) - Abnormal; Notable for the following components:   Troponin I (High Sensitivity) 119 (*)    All other components within normal limits  LIPASE, BLOOD    EKG EKG Interpretation  Date/Time:  Thursday June 24 2022 15:48:40 EDT Ventricular Rate:  87 PR Interval:  180 QRS Duration: 77 QT Interval:  409 QTC Calculation: 493 R Axis:   90 Text Interpretation: Sinus rhythm Left atrial enlargement Borderline right axis deviation Low voltage, precordial leads Borderline T abnormalities, anterior leads Borderline prolonged QT interval Baseline wander in lead(s) III aVF Confirmed by Vanetta Mulders [phone]) on 06/24/2022 4:44:31 PM  Radiology CT ABDOMEN PELVIS W CONTRAST  Result Date: 06/24/2022 CLINICAL DATA:  Abdominal pain. Acid reflux and constipation. Hemodialysis today. EXAM: CT ABDOMEN AND PELVIS WITH CONTRAST TECHNIQUE: Multidetector CT imaging of the abdomen and pelvis was performed using the standard protocol following bolus administration of intravenous contrast. RADIATION DOSE REDUCTION: This exam was performed according to the departmental dose-optimization program which includes automated exposure control, adjustment of the mA and/or kV according to patient size and/or use of iterative reconstruction technique. CONTRAST:  80mL OMNIPAQUE IOHEXOL 300 MG/ML  SOLN COMPARISON:  06/06/2019 FINDINGS: Lower chest: Mild nonspecific airspace opacity in the lingula on the scout image, the lower portion of this is primarily due to volume loss/atelectasis on image 1 series 3. There is some bandlike atelectasis or scarring in the right lower lobe as well. Moderate cardiomegaly. Descending thoracic aortic atherosclerosis. Suspected distal esophageal varices. Hepatobiliary: Hepatic cirrhosis. Borderline gallbladder wall thickening.  Pancreas: Unremarkable Spleen: Unremarkable Adrenals/Urinary  Tract: Bilateral renal atrophy. Hypodense lesion too small to characterize in the right kidney upper pole is likely a cyst. No further imaging workup of this lesion is indicated. Adrenal glands unremarkable. Stomach/Bowel: Borderline wall thickening in proximal loops of small bowel without overt fold effacement. No dilated bowel observed. Wall thickening is present in the ascending colon suspicious for proximal colitis. Vascular/Lymphatic: Atherosclerosis is present, including aortoiliac atherosclerotic disease. Numerous small retroperitoneal lymph nodes are once again identified. Index right common iliac node measures 1.0 cm in short axis on image forty-five series 2, previously 1.2 cm. Suspected small lymph nodes along the splenic hilum up to 1.4 cm in diameter on image 19 series 2, formerly same. Pulmonary venous hypertension with some splenorenal shunting and portosystemic shunting around the rectum. Reproductive: Calcified uterine fibroids. Other: There is some mild chronic right retroperitoneal nodularity within index lesion 0.6 cm in short axis on image 38 series 2, formerly 1.0 cm on 06/06/2019. Chronic hazy edema in the root of the mesentery. Trace perihepatic ascites, reduced from the prior exam. Musculoskeletal: Unremarkable IMPRESSION: 1. Wall thickening in the ascending colon suspicious for proximal colitis. 2. Borderline wall thickening in proximal loops of small bowel without overt fold effacement. This could be from portal hypertension or enteritis. 3. Hepatic cirrhosis with portal venous hypertension and distal esophageal varices. 4. Trace perihepatic ascites, reduced from the prior exam. 5. Chronic hazy edema in the root of the mesentery. 6. Numerous small retroperitoneal lymph nodes are once again identified, some of which are mildly enlarged but stable to slightly smaller than on the prior exam. Mildly enlarged lymph nodes along the  splenic hilum, probably chronic reactive lymph nodes, and similar to 2021. 7. Calcified uterine fibroids. 8. Aortic atherosclerosis. Aortic Atherosclerosis (ICD10-I70.0). Electronically Signed   By: Gaylyn Rong M.D.   On: 06/24/2022 17:00   US Venous Img Lower Unilateral Right  Result Date: 06/24/2022 CLINICAL DATA:  Right leg swelling and pain EXAM: Right LOWER EXTREMITY VENOUS DOPPLER ULTRASOUND TECHNIQUE: Gray-scale sonography with compression, as well as color and duplex ultrasound, were performed to evaluate the deep venous system(s) from the level of the common femoral vein through the popliteal and proximal calf veins. COMPARISON:  Ultrasound 2007 FINDINGS: VENOUS Normal compressibility of the common femoral, superficial femoral, and popliteal veins, as well as the visualized calf veins. Visualized portions of profunda femoral vein and great saphenous vein unremarkable. No filling defects to suggest DVT on grayscale or color Doppler imaging. Doppler waveforms show normal direction of venous flow, normal respiratory plasticity and response to augmentation. Limited views of the contralateral common femoral vein are unremarkable. OTHER Small cystic focus in the popliteal fossa measuring 2.8 x 0.9 x 2.1 cm. No abnormal blood flow on Doppler. Limitations: none IMPRESSION: No evidence of right lower extremity DVT. Cystic lesion in the popliteal fossa measuring 2.8 cm. Possible Baker's cyst. Please correlate for any known history or dedicated evaluation as clinically appropriate Electronically Signed   By: Karen Kays M.D.   On: 06/24/2022 16:55    Procedures Procedures    Medications Ordered in ED Medications  0.9 %  sodium chloride infusion ( Intravenous New Bag/Given 06/24/22 1933)  iohexol (OMNIPAQUE) 300 MG/ML solution 80 mL (80 mLs Intravenous Contrast Given 06/24/22 1559)  pantoprazole (PROTONIX) injection 40 mg (40 mg Intravenous Given 06/24/22 1746)  fentaNYL (SUBLIMAZE) injection 25 mcg  (25 mcg Intravenous Given 06/24/22 1756)  ciprofloxacin (CIPRO) IVPB 400 mg (400 mg Intravenous New Bag/Given 06/24/22 1933)    ED  Course/ Medical Decision Making/ A&P Clinical Course as of 06/24/22 2106  Thu Jun 24, 2022  1610 I spoke with Dr. Wyline Mood with cardiology who recommends overnight admission for observation of the elevated troponins given the patient's medical history.  I will get her plugged into the admission team over at [facility]. [CF]  1906 Reevaluation the patient, her abdominal pain has subsided after fentanyl.  I went over all labs and imaging with her at the bedside with her husband.  I notified her that she will likely be admitted and I spoke to cardiology.  Patient is in agreement for being admitted.  All questions or concerns addressed. [CF]  2105 I spoke with Dr. Margo Aye with [facility] who agrees to admit the patient. [CF]    Clinical Course User Index [CF] Teressa Lower, PA-C   {   Click here for ABCD2, HEART and other calculators  Medical Decision Making TAMILYN OBERBROECKLING is a 67 y.o. female patient who presents to the emergency department today for further evaluation of constipation and chest tightness and leg pain.  With regards to the chest tightness this could be related to reflux but given the patient's age and complex medical history I will likely get some troponins to rule out any ACS.  This been ongoing worsening for the last 5 days.  Regards to her constipation again given her complex medical history I will likely get some imaging as she does appear bloated and does have diffuse tenderness.  I will also add on an ultrasound to evaluate for DVT of the right leg considering that is more swollen and red than the left.  Patient is in no acute distress at this time.  Given the elevated troponins and the findings of colitis I do feel the patient would likely benefit from further evaluation in the hospital.  Dr. Wyline Mood of cardiology was consulted and they will see the  patient tomorrow.  I spoke with Dr. Margo Aye with [facility] who agrees to admit the patient over at [facility] for chest pain rule out and colitis.  Antibiotics are given IV and she is currently stable.  Amount and/or Complexity of Data Reviewed Labs: ordered. Radiology: ordered.  Risk Prescription drug management. Decision regarding hospitalization.    Final Clinical Impression(s) / ED Diagnoses Final diagnoses:  Elevated troponin  Colitis  Generalized abdominal pain    Rx / DC Orders ED Discharge Orders     None         Teressa Lower, PA-C 06/24/22 2106    Loetta Rough, MD 06/25/22 [phone]

## 2022-06-24 NOTE — ED Notes (Signed)
Re called [facility] for a follow up for the Hospitalist @20 :30

## 2022-06-25 ENCOUNTER — Encounter (HOSPITAL_COMMUNITY): Payer: Self-pay | Admitting: Internal Medicine

## 2022-06-25 ENCOUNTER — Observation Stay (HOSPITAL_COMMUNITY): Payer: Medicare Other

## 2022-06-25 DIAGNOSIS — Z7982 Long term (current) use of aspirin: Secondary | ICD-10-CM | POA: Diagnosis not present

## 2022-06-25 DIAGNOSIS — R0789 Other chest pain: Secondary | ICD-10-CM | POA: Diagnosis not present

## 2022-06-25 DIAGNOSIS — R079 Chest pain, unspecified: Secondary | ICD-10-CM

## 2022-06-25 DIAGNOSIS — J309 Allergic rhinitis, unspecified: Secondary | ICD-10-CM | POA: Diagnosis not present

## 2022-06-25 DIAGNOSIS — R9431 Abnormal electrocardiogram [ECG] [EKG]: Secondary | ICD-10-CM | POA: Insufficient documentation

## 2022-06-25 DIAGNOSIS — I5032 Chronic diastolic (congestive) heart failure: Secondary | ICD-10-CM | POA: Diagnosis present

## 2022-06-25 DIAGNOSIS — Z79899 Other long term (current) drug therapy: Secondary | ICD-10-CM | POA: Diagnosis not present

## 2022-06-25 DIAGNOSIS — F411 Generalized anxiety disorder: Secondary | ICD-10-CM | POA: Diagnosis present

## 2022-06-25 DIAGNOSIS — R7989 Other specified abnormal findings of blood chemistry: Secondary | ICD-10-CM | POA: Diagnosis not present

## 2022-06-25 DIAGNOSIS — Z8673 Personal history of transient ischemic attack (TIA), and cerebral infarction without residual deficits: Secondary | ICD-10-CM | POA: Diagnosis not present

## 2022-06-25 DIAGNOSIS — N186 End stage renal disease: Secondary | ICD-10-CM | POA: Diagnosis not present

## 2022-06-25 DIAGNOSIS — R059 Cough, unspecified: Secondary | ICD-10-CM | POA: Diagnosis not present

## 2022-06-25 DIAGNOSIS — K219 Gastro-esophageal reflux disease without esophagitis: Secondary | ICD-10-CM | POA: Diagnosis present

## 2022-06-25 DIAGNOSIS — R1084 Generalized abdominal pain: Secondary | ICD-10-CM | POA: Diagnosis not present

## 2022-06-25 DIAGNOSIS — I272 Pulmonary hypertension, unspecified: Secondary | ICD-10-CM | POA: Diagnosis not present

## 2022-06-25 DIAGNOSIS — R6889 Other general symptoms and signs: Secondary | ICD-10-CM | POA: Diagnosis not present

## 2022-06-25 DIAGNOSIS — Z992 Dependence on renal dialysis: Secondary | ICD-10-CM | POA: Diagnosis not present

## 2022-06-25 DIAGNOSIS — R072 Precordial pain: Secondary | ICD-10-CM | POA: Diagnosis not present

## 2022-06-25 DIAGNOSIS — E785 Hyperlipidemia, unspecified: Secondary | ICD-10-CM | POA: Diagnosis present

## 2022-06-25 DIAGNOSIS — K529 Noninfective gastroenteritis and colitis, unspecified: Secondary | ICD-10-CM | POA: Diagnosis not present

## 2022-06-25 LAB — CBC WITH DIFFERENTIAL/PLATELET
Abs Immature Granulocytes: 0.01 10*3/uL (ref 0.00–0.07)
Basophils Absolute: 0 10*3/uL (ref 0.0–0.1)
Basophils Relative: 0 %
Eosinophils Absolute: 0 10*3/uL (ref 0.0–0.5)
Eosinophils Relative: 1 %
HCT: 31.8 % — ABNORMAL LOW (ref 36.0–46.0)
Hemoglobin: 9.8 g/dL — ABNORMAL LOW (ref 12.0–15.0)
Immature Granulocytes: 0 %
Lymphocytes Relative: 17 %
Lymphs Abs: 0.4 10*3/uL — ABNORMAL LOW (ref 0.7–4.0)
MCH: 26.6 pg (ref 26.0–34.0)
MCHC: 30.8 g/dL (ref 30.0–36.0)
MCV: 86.2 fL (ref 80.0–100.0)
Monocytes Absolute: 0.4 10*3/uL (ref 0.1–1.0)
Monocytes Relative: 17 %
Neutro Abs: 1.6 10*3/uL — ABNORMAL LOW (ref 1.7–7.7)
Neutrophils Relative %: 65 %
Platelets: 128 10*3/uL — ABNORMAL LOW (ref 150–400)
RBC: 3.69 MIL/uL — ABNORMAL LOW (ref 3.87–5.11)
RDW: 18.3 % — ABNORMAL HIGH (ref 11.5–15.5)
WBC: 2.5 10*3/uL — ABNORMAL LOW (ref 4.0–10.5)
nRBC: 0 % (ref 0.0–0.2)

## 2022-06-25 LAB — COMPREHENSIVE METABOLIC PANEL
ALT: 14 U/L (ref 0–44)
AST: 18 U/L (ref 15–41)
Albumin: 2.8 g/dL — ABNORMAL LOW (ref 3.5–5.0)
Alkaline Phosphatase: 84 U/L (ref 38–126)
Anion gap: 13 (ref 5–15)
BUN: 35 mg/dL — ABNORMAL HIGH (ref 8–23)
CO2: 29 mmol/L (ref 22–32)
Calcium: 8.8 mg/dL — ABNORMAL LOW (ref 8.9–10.3)
Chloride: 92 mmol/L — ABNORMAL LOW (ref 98–111)
Creatinine, Ser: 5.14 mg/dL — ABNORMAL HIGH (ref 0.44–1.00)
GFR, Estimated: 9 mL/min — ABNORMAL LOW (ref >60)
Glucose, Bld: 130 mg/dL — ABNORMAL HIGH (ref 70–99)
Potassium: 3.5 mmol/L (ref 3.5–5.1)
Sodium: 134 mmol/L — ABNORMAL LOW (ref 135–145)
Total Bilirubin: 0.5 mg/dL (ref 0.3–1.2)
Total Protein: 6.4 g/dL — ABNORMAL LOW (ref 6.5–8.1)

## 2022-06-25 LAB — MAGNESIUM: Magnesium: 2.1 mg/dL (ref 1.7–2.4)

## 2022-06-25 LAB — PHOSPHORUS: Phosphorus: 4.5 mg/dL (ref 2.5–4.6)

## 2022-06-25 LAB — C-REACTIVE PROTEIN: CRP: 0.5 mg/dL (ref <1.0)

## 2022-06-25 LAB — TROPONIN I (HIGH SENSITIVITY): Troponin I (High Sensitivity): 119 ng/L (ref <18)

## 2022-06-25 LAB — SEDIMENTATION RATE: Sed Rate: 40 mm/hr — ABNORMAL HIGH (ref 0–22)

## 2022-06-25 MED ORDER — AMBRISENTAN 5 MG PO TABS
10.0000 mg | ORAL_TABLET | Freq: Every day | ORAL | Status: DC
  Administered 2022-06-25: 10 mg via ORAL
  Filled 2022-06-25: qty 2

## 2022-06-25 MED ORDER — POLYETHYLENE GLYCOL 3350 17 G PO PACK: 17.0000 g | PACK | Freq: Two times a day (BID) | ORAL | 0 refills | Status: AC

## 2022-06-25 MED ORDER — CLONAZEPAM 0.5 MG PO TABS: 0.5000 mg | ORAL_TABLET | Freq: Two times a day (BID) | ORAL | Status: DC | PRN

## 2022-06-25 MED ORDER — AMOXICILLIN-POT CLAVULANATE 500-125 MG PO TABS: 1.0000 | ORAL_TABLET | ORAL | 0 refills | Status: AC

## 2022-06-25 MED ORDER — DOCUSATE SODIUM 100 MG PO CAPS
200.0000 mg | ORAL_CAPSULE | Freq: Every day | ORAL | Status: DC
  Filled 2022-06-25: qty 2

## 2022-06-25 MED ORDER — GABAPENTIN 100 MG PO CAPS
100.0000 mg | ORAL_CAPSULE | Freq: Three times a day (TID) | ORAL | Status: DC
  Administered 2022-06-25: 100 mg via ORAL
  Filled 2022-06-25: qty 1

## 2022-06-25 MED ORDER — SELEXIPAG 800 MCG PO TABS: 800.0000 ug | ORAL_TABLET | Freq: Two times a day (BID) | ORAL | Status: DC

## 2022-06-25 MED ORDER — CALCIUM ACETATE (PHOS BINDER) 667 MG PO CAPS
667.0000 mg | ORAL_CAPSULE | Freq: Three times a day (TID) | ORAL | Status: DC
  Administered 2022-06-25: 667 mg via ORAL
  Filled 2022-06-25: qty 1

## 2022-06-25 MED ORDER — ASPIRIN 325 MG PO TABS
325.0000 mg | ORAL_TABLET | Freq: Once | ORAL | Status: AC
  Administered 2022-06-25: 325 mg via ORAL
  Filled 2022-06-25: qty 1

## 2022-06-25 MED ORDER — FLUTICASONE PROPIONATE 50 MCG/ACT NA SUSP
1.0000 | Freq: Every day | NASAL | Status: DC
  Filled 2022-06-25: qty 16

## 2022-06-25 MED ORDER — LORATADINE 10 MG PO TABS: 10.0000 mg | ORAL_TABLET | Freq: Every evening | ORAL | Status: DC

## 2022-06-25 MED ORDER — AMOXICILLIN-POT CLAVULANATE 875-125 MG PO TABS: 1.0000 | ORAL_TABLET | Freq: Two times a day (BID) | ORAL | 0 refills | Status: DC

## 2022-06-25 MED ORDER — BENZONATATE 100 MG PO CAPS
200.0000 mg | ORAL_CAPSULE | Freq: Three times a day (TID) | ORAL | Status: DC | PRN
  Administered 2022-06-25: 200 mg via ORAL
  Filled 2022-06-25: qty 2

## 2022-06-25 MED ORDER — AMOXICILLIN-POT CLAVULANATE 875-125 MG PO TABS: 1.0000 | ORAL_TABLET | Freq: Two times a day (BID) | ORAL | Status: DC

## 2022-06-25 MED ORDER — LUBIPROSTONE 8 MCG PO CAPS
8.0000 ug | ORAL_CAPSULE | Freq: Two times a day (BID) | ORAL | Status: DC
  Filled 2022-06-25 (×2): qty 1

## 2022-06-25 MED ORDER — ATORVASTATIN CALCIUM 80 MG PO TABS
80.0000 mg | ORAL_TABLET | Freq: Every day | ORAL | Status: DC
  Administered 2022-06-25: 80 mg via ORAL
  Filled 2022-06-25: qty 1

## 2022-06-25 MED ORDER — AMBRISENTAN 5 MG PO TABS
10.0000 mg | ORAL_TABLET | Freq: Every day | ORAL | Status: DC
  Filled 2022-06-25: qty 2

## 2022-06-25 MED ORDER — FAMOTIDINE 20 MG PO TABS
20.0000 mg | ORAL_TABLET | Freq: Two times a day (BID) | ORAL | Status: DC
  Administered 2022-06-25: 20 mg via ORAL
  Filled 2022-06-25: qty 1

## 2022-06-25 MED ORDER — BISACODYL 5 MG PO TBEC: 5.0000 mg | DELAYED_RELEASE_TABLET | Freq: Every day | ORAL | 0 refills | Status: AC | PRN

## 2022-06-25 MED ORDER — POLYETHYLENE GLYCOL 3350 17 G PO PACK: 17.0000 g | PACK | Freq: Every day | ORAL | Status: DC | PRN

## 2022-06-25 MED ORDER — BISACODYL 5 MG PO TBEC: 5.0000 mg | DELAYED_RELEASE_TABLET | Freq: Every day | ORAL | Status: DC | PRN

## 2022-06-25 MED ORDER — AMOXICILLIN-POT CLAVULANATE 500-125 MG PO TABS
1.0000 | ORAL_TABLET | ORAL | Status: DC
  Filled 2022-06-25: qty 1

## 2022-06-25 NOTE — Assessment & Plan Note (Signed)
Continue with statin therapy.  ?

## 2022-06-25 NOTE — Consult Note (Signed)
Cardiology Consultation   Patient ID: Katie Nunez MRN: [id number]; DOB: Jun 05, 1955  Admit date: 06/24/2022 Date of Consult: 06/25/2022  PCP:  Merri Brunette, MD   [facility] Providers Cardiologist:  None   [facility] Patient Profile:   Katie Nunez is a 67 y.o. female with a hx of PAH related to her scleroderma, ESRD, liver cirrhosis, SVT status post ablation 2021, ITP, hemorrhagic stroke 03/2020 with residual right-sided hemiplegia, hyperlipidemia, GERD, IBS/constipation who is being seen 06/25/2022 for the evaluation of chest pressure at the request of Dr. Ella Jubilee.  History of Present Illness:   Ms. Busse is followed by [facility] cardiology and recently underwent right heart catheterization on 06/11/2022 due to complaints of decreased exercise intolerance.  Results are as follows: RA 14, RV 82/12, PA 82/30 mean 52 wedge 13  CO 5.4 with index of 3.3 PVR is 7.3 and SVR of 14. Also has a recent echocardiogram in April 2024 that showed normal LVEF.  Moderate RV dysfunction.  Moderate TR.  Mild AAS.  Patient also has history of SVT status post ablation in 2021 has not had any further episodes of atrial tachycardia since then and not required any AV nodal blocking agents.  Previously did not tolerate beta-blockers due to hypotension.  Patient denies any history of family heart disease.  Has never had prior ischemic evaluation.  Patient is a transfer from [facility] with reported complaints of chest pain, however patient reports this to be indigestion.  Since her stroke she states that she has had some dysfunction with swallowing.  She states that for the past several weeks that she has had issues with bowel movements and is not fully able to relieve herself and also has history of IBS-constipation.  Additionally, patient states that she has recent endoscopy and was being worked up for [employer] Pylori but has been struggling to get the lab testing done for this.  Patient  denies any exertional chest pain, radiation, worsening shortness of breath, significant decrease in functional status, edema. States that over the course of a year she feels that she been a little more short of breath, but attributes this due to her comorbidites and denies any acute declines. After one-time dose of Pepcid patient states that her discomfort was relieved and has not returned.  EKG showing normal sinus rhythm, heart rate 83, no acute ST-T wave changes. No acute ST-T wave changes.  Troponins elevated but flat [phone].  CT of the abdominal/pelvis shows wall thickening in the ascending colon suggesting proximal colitis.  Patient had echo ordered but has refused this because she feels it was unnecessary because she just got one done and said that "this got all messed up, I'm not having chest pain and I was wanting to see a GI doctor."  Past Medical History:  Diagnosis Date   Achalasia    Anxiety    Dysplasia of cervix, low grade (CIN 1)    Environmental allergies    "all year long" (12/27/2016)   ESRD (end stage renal disease) on dialysis (HCC)    "TTS; [place]" (12/27/2016)   Fibromyalgia    GERD (gastroesophageal reflux disease)    Gout    Hypertension    IBS (irritable bowel syndrome)    MVP (mitral valve prolapse)    RA (rheumatoid arthritis) (HCC)    FOLLOWED BY DR. SHANAHAN   Raynaud's disease    Scleroderma (HCC)    Seasonal allergies    Thrombocytopenia (HCC) 07/01/2016  Acute fall to 13,000 07/01/16   Tubular adenoma 01/08/2008   CECUM   Vitamin D deficiency     Past Surgical History:  Procedure Laterality Date   ANKLE FRACTURE SURGERY Right    AV FISTULA PLACEMENT Left 06/28/2016   Procedure: left arm ARTERIOVENOUS (AV) FISTULA CREATION;  Surgeon: Larina Earthly, MD;  Location: [facility] OR;  Service: Vascular;  Laterality: Left;   BASCILIC VEIN TRANSPOSITION Left 09/27/2016   Procedure: LEFT UPPER ARM CEPHALIC VEIN TRANSPOSITION;  Surgeon: Larina Earthly, MD;   Location: [facility] OR;  Service: Vascular;  Laterality: Left;   BREAST BIOPSY     "? side"   CESAREAN SECTION  1994   CO2 LASER OF CERVIX     COLONOSCOPY W/ BIOPSIES  01/08/2008   INSERTION OF DIALYSIS CATHETER Right 06/28/2016   Procedure: INSERTION OF DIALYSIS CATHETER, right internal jugular;  Surgeon: Larina Earthly, MD;  Location: [facility] OR;  Service: Vascular;  Laterality: Right;   MYOMECTOMY     NASAL ENDOSCOPY WITH EPISTAXIS CONTROL N/A 12/29/2019   Procedure: NASAL ENDOSCOPY WITH EPISTAXIS CONTROL;  Surgeon: Newman Pies, MD;  Location: [facility] OR;  Service: ENT;  Laterality: N/A;   PELVIC LAPAROSCOPY  2011   superficial thrombophlebitis Left 07-2014    Inpatient Medications: Scheduled Meds:  ambrisentan  10 mg Oral Daily   amoxicillin-clavulanate  1 tablet Oral Q24H   atorvastatin  80 mg Oral Daily   calcium acetate  667 mg Oral TID WC   docusate sodium  200 mg Oral Daily   famotidine  20 mg Oral BID   fluticasone  1 spray Each Nare Daily   gabapentin  100 mg Oral TID   loratadine  10 mg Oral QPM   lubiprostone  8 mcg Oral BID WC   Selexipag  800 mcg Oral BID   Continuous Infusions:  sodium chloride Stopped (06/25/22 1529)   PRN Meds: sodium chloride, acetaminophen **OR** acetaminophen, alum & mag hydroxide-simeth, benzonatate, bisacodyl, clonazePAM, fentaNYL (SUBLIMAZE) injection, melatonin, naLOXone (NARCAN)  injection, ondansetron (ZOFRAN) IV, polyethylene glycol  Allergies:    Allergies  Allergen Reactions   Other Anaphylaxis and Other (See Comments)    Do not use polyflux membrane.  Use alternate Other reaction(s): heart racing   Savella [Milnacipran Hcl] Palpitations and Other (See Comments)    Unknown   Tape Rash and Other (See Comments)    Itch- unsure if it was paper or adhesive tape    Social History:   Social History   Socioeconomic History   Marital status: Married    Spouse name: Not on file   Number of children: Not on file   Years of education: Not on file    Highest education level: Not on file  Occupational History   Not on file  Tobacco Use   Smoking status: Never   Smokeless tobacco: Never  Vaping Use   Vaping Use: Never used  Substance and Sexual Activity   Alcohol use: No   Drug use: No   Sexual activity: Not Currently    Birth control/protection: Post-menopausal  Other Topics Concern   Not on file  Social History Narrative   Lives in [place]   Social Determinants of Health   Financial Resource Strain: Not on file  Food Insecurity: No Food Insecurity (06/24/2022)   Hunger Vital Sign    Worried About Running Out of Food in the Last Year: Never true    Ran Out of Food in the Last Year: Never true  Transportation Needs: No Transportation Needs (06/24/2022)   PRAPARE - Administrator, Civil Service (Medical): No    Lack of Transportation (Non-Medical): No  Physical Activity: Not on file  Stress: Not on file  Social Connections: Not on file  Intimate Partner Violence: Not At Risk (06/24/2022)   Humiliation, Afraid, Rape, and Kick questionnaire    Fear of Current or Ex-Partner: No    Emotionally Abused: No    Physically Abused: No    Sexually Abused: No    Family History:   Family History  Problem Relation Age of Onset   Hypertension Mother    Diabetes Mother    Heart disease Father    Hypertension Maternal Aunt    Diabetes Maternal Grandmother    Heart disease Paternal Grandfather    Cerebral palsy Cousin        1ST COUSIN?   Diabetes Paternal Grandmother      ROS:  Please see the history of present illness.  All other ROS reviewed and negative.     Physical Exam/Data:   Vitals:   06/25/22 0440 06/25/22 0557 06/25/22 0826 06/25/22 1200  BP: 108/64   103/69  Pulse:   89 89  Resp:   18 18  Temp: 98.1 F (36.7 C)  98.7 F (37.1 C)   TempSrc: Oral  Oral   SpO2:   92% 92%  Weight:  57.1 kg    Height:        Intake/Output Summary (Last 24 hours) at 06/25/2022 1630 Last data filed at 06/24/2022  2351 Gross per 24 hour  Intake 400 ml  Output --  Net 400 ml       06/25/2022    5:57 AM 06/24/2022   10:45 PM 06/24/2022    2:09 PM  Last 3 Weights  Weight (lbs) 125 lb 12.8 oz 123 lb 12.8 oz 130 lb 4.7 oz  Weight (kg) 57.063 kg 56.155 kg 59.1 kg     Body mass index is 20.93 kg/m.  General:  Well nourished, well developed, in no acute distress HEENT: normal Neck: no JVD Vascular: No carotid bruits; Distal pulses 2+ bilaterally Cardiac:  normal S1, S2; RRR; + 2/6 murmur Lungs:  clear to auscultation bilaterally, no wheezing, rhonchi or rales  Abd: soft, nontender, no hepatomegaly  Ext: no edema Musculoskeletal:  No deformities, BUE and BLE strength normal and equal Skin: warm and dry  Neuro:  CNs 2-12 intact, no focal abnormalities noted Psych:  Normal affect   EKG:  The EKG was personally reviewed and demonstrates:  EKG showing normal sinus rhythm, heart rate 83, nonspecific ST-T wave changes. Telemetry:  Telemetry was personally reviewed and demonstrates:  NSR HR 80s  Relevant CV Studies:  CATH right heart (06/11/2022 8:48 AM EDT) Component Value Ref Range Test Method Analysis Time Performed At Pathologist Signature  Cardiac Index (l/min/m2) 3.3 L/min/m2     [facility]    Right Atrium Mean Pressure (mmHg) 14 mmHg     [facility]    Right Ventricle Systolic Pressure (mmHg) 82 mmHg     [facility]    Pulmonary Artery Mean Pressure (mmHg) 52 mmHg     [facility]    Pulmonary Wedge Pressure (mmHg) 13 mmHg     [facility]    Pulmonary Vascular Resistance (Wood units) 7.3 Wood units     [facility]    QS (For CO) (L/min) 5.4 L/min     [facility]    Right Ventricle Diastolic Pressure (mmHg) 10 mmHg     [facility]    Pulmonary Artery Systolic Pressure (mmHg) 82 mmHg     [facility]    Pulmonary Artery Diastolic Pressure (mmHg) 30 mmHg     [facility]    Stroke Volume (mL/beat) 65.3 mL/beat     [facility]     Echo 04/27/2022 NORMAL LEFT VENTRICULAR SYSTOLIC FUNCTION    MODERATE RV SYSTOLIC DYSFUNCTION (See above)    VALVULAR REGURGITATION: TRIVIAL AR, TRIVIAL MR, MILD PR, MODERATE TR    VALVULAR STENOSIS: MILD AS    3D acquisition and reconstructions were performed as part of this    examination to more accurately quantify the effects of identified    structural abnormalities as part of the exam. (post-processing on an    Independent workstation).   2D DIMENSIONS  AORTA          Values     Normal RangeMAIN PA      Values     Normal Range       Annulus:  nm*  cm    [1.9 - 2.7]    PA Main:   2.6 cm    [1.5 - 2.1]     Aorta Sin:   2.4 cm    [2.4 - 3.6] RIGHT VENTRICLE   ST Junction:  nm*  cm    [2 - 3.2]      RV Base:   5.1 cm    [2.5 - 4.1]     Asc.Aorta:   2.8 cm    [1.9 - 3.5]     RV Mid:   4.1 cm    [1.9 - 3.5]  LEFT VENTRICLE                         RV Length:  nm*  cm    [  ]         LVIDd:   4.6 cm    [3.7 - 5.3] RIGHT ATRIUM         LVIDs:   2.2 cm    [2.2 - 3.4]    RA Area:  22   cm2   [ <= 20]        LVEDVi:  53.0 ml/m2 [29 - 61]         RAVi:  45   ml/m2 [15 - 27]        LVESVi:  20.0 ml/m2 [8 - 24]    INFERIOR VENA CAVA            FS:  52   %     [ >= 25]       Max.IVC:   1.9 cm    [ <= 2.1]           SWT:   0.9 cm    [0.6 - 0.9]    Min.IVC:  nm*  cm    [ <= 1.7]           PWT:   0.9 cm    [0.6 - 0.9] __________________  LEFT ATRIUM                           nm* - not measured       LA Diam:   3.9 cm    [2.7 - 3.8]       LA  Area:  25   cm2   [ <= 20]     LA Volume:  74   ml    [22 - 52]          LAVi:  45   ml/m2 [16 - 34]   ECHOCARDIOGRAPHIC DESCRIPTIONS -----------------------------------------------  AORTIC ROOT          Size: Normal    Dissection: INDETERM FOR DISSECTION   AORTIC VALVE      Leaflets: Tricuspid             Morphology: Normal      Mobility: Fully Mobile   LEFT VENTRICLE                                      Anterior: Normal          Size:  Normal                                 Lateral: Normal   Contraction: Normal                                  Septal: Normal    Closest EF: >55%(Estimated)  Calc.EF: 63% (3D)      Apical: Normal     LV masses: No Masses                             Inferior: Normal           LVH: None                                 Posterior: Normal   LV GLS(GE): -21.6% Normal Range [ <= -16]       LV Note: SEPTAL FLATTENING C/W PRESSURE AND VOLUME OVERLOAD  Dias.FxClass: INDETERMINATE   MITRAL VALVE      Leaflets: ABNORMAL                Mobility: Fully mobile    Morphology: PROLAPSE ANTERIOR       MV Note: ANNULAR CALCIFICATION   LEFT ATRIUM          Size: MODERATELY ENLARGED     LA masses: No masses                Normal IAS   MAIN PA          Size: DILATED       PA Note: PV AccT 141 ms   PULMONIC VALVE    Morphology: Normal      Mobility: Fully Mobile   RIGHT VENTRICLE          Size: MODERATELY ENLARGED       Free wall: HYPOCONTRACTILE   Contraction: MOD GLOBAL DECREASE       RV masses: No Masses        RV GLS: -15.1% Normal Range [ <= -15]         TAPSE:   2.0 cm,  Normal Range [>= 1.6 cm]       RV Note: RV S' = 14 cm/s   TRICUSPID VALVE      Leaflets: Normal  Mobility: Fully mobile    Morphology: Normal   RIGHT ATRIUM          Size: MILDLY ENLARGED            RA Other: None     RA masses: No masses   PERICARDIUM        Fluid: No effusion   INFERIOR VENACAVA          Size: Normal     ABNORMAL RESPIRATORY COLLAPSE   DOPPLER ECHO and OTHER SPECIAL PROCEDURES ------------------------------------     Aortic: TRIVIAL AR             MILD AS      3.1 m/s peak vel   38 mmHg peak grad  17 mmHg mean grad 1.1 cm2 by DOPPLER   LVOT Diam: 2.0 cm. Resting LVOT Vel: 1.1 m/s. Dimensionless Index: 0.36      Mitral: TRIVIAL MR             No MS     MV Inflow E Vel.= 104.0 cm/s  MV Annulus E'Vel.= 8.0 cm/s  E/E'Ratio= 13   Tricuspid: MODERATE TR            No TS              4.1 m/s peak TR vel   75 mmHg peak RV pressure   Pulmonary: MILD PR                No PS  Laboratory Data:  High Sensitivity Troponin:   Recent Labs  Lab 06/24/22 1459 06/24/22 1657 06/25/22 0527  TROPONINIHS 124* 119* 119*      Chemistry Recent Labs  Lab 06/24/22 1459 06/25/22 0057  NA 137 134*  K 3.6 3.5  CL 95* 92*  CO2 32 29  GLUCOSE 96 130*  BUN 31* 35*  CREATININE 4.20* 5.14*  CALCIUM 8.5* 8.8*  MG  --  2.1  GFRNONAA 11* 9*  ANIONGAP 10 13     Recent Labs  Lab 06/24/22 1459 06/25/22 0057  PROT 7.0 6.4*  ALBUMIN 3.1* 2.8*  AST 21 18  ALT 14 14  ALKPHOS 91 84  BILITOT 0.4 0.5    Lipids No results for input(s): "CHOL", "TRIG", "HDL", "LABVLDL", "LDLCALC", "CHOLHDL" in the last 168 hours.  Hematology Recent Labs  Lab 06/24/22 1459 06/25/22 0057  WBC 2.9* 2.5*  RBC 3.65* 3.69*  HGB 9.8* 9.8*  HCT 31.8* 31.8*  MCV 87.1 86.2  MCH 26.8 26.6  MCHC 30.8 30.8  RDW 18.3* 18.3*  PLT 123* 128*    Thyroid No results for input(s): "TSH", "FREET4" in the last 168 hours.  BNPNo results for input(s): "BNP", "PROBNP" in the last 168 hours.  DDimer No results for input(s): "DDIMER" in the last 168 hours.   Radiology/Studies:  DG Chest Port 1 View  Result Date: 06/25/2022 CLINICAL DATA:  Chest pain cough EXAM: PORTABLE CHEST 1 VIEW COMPARISON:  03/24/2022 FINDINGS: Cardiac shadow is enlarged but stable. Aortic calcifications are seen. Lungs are clear. Persistent scarring in the left mid lung is noted. No bony abnormality is seen. IMPRESSION: No active disease. Electronically Signed   By: Alcide Clever M.D.   On: 06/25/2022 00:12   CT ABDOMEN PELVIS W CONTRAST  Result Date: 06/24/2022 CLINICAL DATA:  Abdominal pain. Acid reflux and constipation. Hemodialysis today. EXAM: CT ABDOMEN AND PELVIS WITH CONTRAST TECHNIQUE: Multidetector CT imaging of the abdomen and pelvis was performed using the standard protocol following bolus administration of  intravenous  contrast. RADIATION DOSE REDUCTION: This exam was performed according to the departmental dose-optimization program which includes automated exposure control, adjustment of the mA and/or kV according to patient size and/or use of iterative reconstruction technique. CONTRAST:  80mL OMNIPAQUE IOHEXOL 300 MG/ML  SOLN COMPARISON:  06/06/2019 FINDINGS: Lower chest: Mild nonspecific airspace opacity in the lingula on the scout image, the lower portion of this is primarily due to volume loss/atelectasis on image 1 series 3. There is some bandlike atelectasis or scarring in the right lower lobe as well. Moderate cardiomegaly. Descending thoracic aortic atherosclerosis. Suspected distal esophageal varices. Hepatobiliary: Hepatic cirrhosis. Borderline gallbladder wall thickening. Pancreas: Unremarkable Spleen: Unremarkable Adrenals/Urinary Tract: Bilateral renal atrophy. Hypodense lesion too small to characterize in the right kidney upper pole is likely a cyst. No further imaging workup of this lesion is indicated. Adrenal glands unremarkable. Stomach/Bowel: Borderline wall thickening in proximal loops of small bowel without overt fold effacement. No dilated bowel observed. Wall thickening is present in the ascending colon suspicious for proximal colitis. Vascular/Lymphatic: Atherosclerosis is present, including aortoiliac atherosclerotic disease. Numerous small retroperitoneal lymph nodes are once again identified. Index right common iliac node measures 1.0 cm in short axis on image forty-five series 2, previously 1.2 cm. Suspected small lymph nodes along the splenic hilum up to 1.4 cm in diameter on image 19 series 2, formerly same. Pulmonary venous hypertension with some splenorenal shunting and portosystemic shunting around the rectum. Reproductive: Calcified uterine fibroids. Other: There is some mild chronic right retroperitoneal nodularity within index lesion 0.6 cm in short axis on image 38 series 2, formerly 1.0 cm  on 06/06/2019. Chronic hazy edema in the root of the mesentery. Trace perihepatic ascites, reduced from the prior exam. Musculoskeletal: Unremarkable IMPRESSION: 1. Wall thickening in the ascending colon suspicious for proximal colitis. 2. Borderline wall thickening in proximal loops of small bowel without overt fold effacement. This could be from portal hypertension or enteritis. 3. Hepatic cirrhosis with portal venous hypertension and distal esophageal varices. 4. Trace perihepatic ascites, reduced from the prior exam. 5. Chronic hazy edema in the root of the mesentery. 6. Numerous small retroperitoneal lymph nodes are once again identified, some of which are mildly enlarged but stable to slightly smaller than on the prior exam. Mildly enlarged lymph nodes along the splenic hilum, probably chronic reactive lymph nodes, and similar to 2021. 7. Calcified uterine fibroids. 8. Aortic atherosclerosis. Aortic Atherosclerosis (ICD10-I70.0). Electronically Signed   By: Gaylyn Rong M.D.   On: 06/24/2022 17:00   US Venous Img Lower Unilateral Right  Result Date: 06/24/2022 CLINICAL DATA:  Right leg swelling and pain EXAM: Right LOWER EXTREMITY VENOUS DOPPLER ULTRASOUND TECHNIQUE: Gray-scale sonography with compression, as well as color and duplex ultrasound, were performed to evaluate the deep venous system(s) from the level of the common femoral vein through the popliteal and proximal calf veins. COMPARISON:  Ultrasound 2007 FINDINGS: VENOUS Normal compressibility of the common femoral, superficial femoral, and popliteal veins, as well as the visualized calf veins. Visualized portions of profunda femoral vein and great saphenous vein unremarkable. No filling defects to suggest DVT on grayscale or color Doppler imaging. Doppler waveforms show normal direction of venous flow, normal respiratory plasticity and response to augmentation. Limited views of the contralateral common femoral vein are unremarkable. OTHER  Small cystic focus in the popliteal fossa measuring 2.8 x 0.9 x 2.1 cm. No abnormal blood flow on Doppler. Limitations: none IMPRESSION: No evidence of right lower extremity DVT. Cystic lesion in the popliteal  fossa measuring 2.8 cm. Possible Baker's cyst. Please correlate for any known history or dedicated evaluation as clinically appropriate Electronically Signed   By: Karen Kays M.D.   On: 06/24/2022 16:55     Assessment and Plan:   Atypical chest pain/indigestion? with elevated troponin Patient reporting one-time occurrence of chest pain that was relieved by Pepcid.  Patient denies any shortness of breath, radiation, exertional component with his chest pain.  EKG showed nonspecific ST-T wave changes.  Troponins elevated however flat and in the setting of end-stage renal disease, seems reasonable.  [phone].  Given other complaints of indigestion, swallowing dysfunction, and GI related issues noted on CT (colitis) and HPI (issues with bowel movements, history of IBS, possible workup for H. Pylori, GERD) low suspicion this is cardiac related.  Recommend GI consultation if appropriate. Additionally, patient feels that cardiology consultation was not warranted.  Had refused echocardiogram.  Does not want any further workup. Pending discussion with MD regarding further recs.  Pulmonary arterial hypertension thought to be related to CTD scleroderma Followed [facility]ology.  PTA  selexipag 800 BID and Macitentan.  Plans were to add sotatercept   Right heart catheterization 06/11/2022: RA 14, RV 82/12, PA 82/30 mean 52 wedge 13  CO 5.4 with index of 3.3 PVR is 7.3 and SVR of 14. BP 136/71   End-stage renal disease Liver cirrhosis SVT status post ablation 2021 stable without any recurrences. ITP Hemorrhagic stroke 2022 GERD   Risk Assessment/Risk Scores:   For questions or updates, please contact [facility] Please consult [email or web address] for contact info under     Signed, Dietrich Pates, MD  06/25/2022 4:30 PM   Patient seen and examined   I agree with findings as noted by Sallyanne Havers above  Pt is a 67 yo with hx of an overlap CTD, severe pulmonary HTN (Followed at [facility] by Shellia Carwin; just seen this spring 2024, underwent RHC as noted above), ESRD (on dialysis), SVT s/p ablation, ITP, CVA (hemorrhagic) in 2022. The pt presented to [facility] with chest pressure   She says it began in her abdomen and went up to her low chest    Pressure sensation.    She has not had any upper chest pressure    She says her breathing is unchanged   Dialysis is unchanged Has chronic problems with bowels  Since admit trop mildly elevated but flat (124, 119, 119)   Echo recomm but she refused,   Says she just had echo at [facility]. Currently feeling better with minimal pressure  CT of abdomen shows cirrhosis, suspected esophageal varices, very mild aortic atherosclerosis, thickened wall of asc colon suspicious for colitis   She has been on Abx On exam, Pt a very thin 67 yo    Neck  Elevated JVP   Lungs   Mild rales at based Cardiac exam   RRR  prominent S1  II/VI systolic murmur LSB  +RV heave Abd   Mild diffuse tenderness Ext  Tr edema with chronic skin changes (thickening) of fet  Impression  Chest pressure    Pt's symptoms appear atypical for coronary ischemia  Sound more GI in nature   Improving today Troponin elevation very mild, flat    Can be explained with pt's hx of severe pulmonary HTN /  as well as ESRD (on dialysis) which can complicate I would recomm continuing patient's current regimen   She will have follow up  at [facility] (Dr Monia Pouch)   Will try  to arrange follow up locally in our pulmonary HTN clinc   Pulmonary HTN   Follows at [facility] (Dr Monia Pouch)   Recent RHC  end of May 2024  RA 14   RV82/12  PA 82/30  wedge 13  CO/CI 5.4/3.3    PVR 7.2  SVR 14     Continue medical Rx (on selexipag and macitentan at home)   Dr Ottis Stain trying to get sotatercept for her as well    She would like to see cardiology locally as well in between appts and for more emergent issues    Will arrange for this in our pulmonary HTN clnic  Continue O2  ESRD   3x per week dialysis    has not missed    Will continue locally   Dietrich Pates MD

## 2022-06-25 NOTE — Hospital Course (Signed)
Katie Nunez was admitted to the hospital with the working diagnosis of atypical chest pain.   67 yo female with the past medical history of ESRD on HD, heart failure, IBS, GERD, pulmonary hypertension and anxiety. Reported chest pain, substernal, with no radiation, that prompted her to come to the hospital. Symptoms improved with antiacid therapy. On her initial physical examination her blood pressure was 115/64, HR 93, RR 23 and 02 saturation 100%, lungs with no wheezing, rales or rhonchi, heart with S1 and S2 present and rhythmic, abdomen with no distention and no lower extremity edema.   Na 137, K 3,6 Cl 95 bicarbonate 32, glucose 96 bun 31 cr 4,20 Lipae 48 AST 21 ALT 14 High sensitive troponin 124 and 119  Wbc 2,9 hgb 9,8 plt 123   CT abdomen and pelvis with wall thickening in the ascending colon suspicious for proximal colitis. Borderline wall thickening in proximal loops of small bowel without overt fold effacement. Hepatic cirrhosis with portal hypertension and distal esophageal varices. Trace perihepatic ascites. Positive retro peritoneal lymph nodes.   Chest radiograph with cardiomegaly, with no effusions or infiltrates.   EKG 87 bpm, normal axis, normal intervals, sinus rhythm with no significant ST segment or T wave changes.

## 2022-06-25 NOTE — Assessment & Plan Note (Addendum)
Mild colitis.   Patient with improvement in abdominal pain, she has been constipated. No nausea or vomiting, she has been tolerating po well.   Plan to add Augmentin bid for 7 days. Follow up as outpatient.  Add PRN dulcolax.

## 2022-06-25 NOTE — Assessment & Plan Note (Signed)
Continue with famotidine. Follow up with GI hepatology as outpatient.

## 2022-06-25 NOTE — TOC Initial Note (Signed)
Transition of Care [facility]) - Initial/Assessment Note    Patient Details  Name: Katie Nunez MRN: [id number] Date of Birth: 05/08/1955  Transition of [facility] CM/SW Contact:    Leone Haven, RN Phone Number: 06/25/2022, 3:08 PM  Clinical Narrative:                 From home with spouse and son, she has PCP, Severiano Gilbert, she has insurance with medication coverage.  She does not have any HH services in place at this time.  She does home home oxygen with [employer] 3 liters and a rollator.  She states her son , Jomarie Longs, will transport her home at dc.  Her spouse and her son are her support system. Seh gets meds from [facility]  in [place].  NCM informed her to let her son, Jomarie Longs, know to bring her oxygen when he comes to pick her up she states she will tell him.   Expected Discharge Plan: Home/Self Care Barriers to Discharge: No Barriers Identified   Patient Goals and CMS Choice Patient states their goals for this hospitalization and ongoing recovery are:: return home   Choice offered to / list presented to : NA      Expected Discharge Plan and Services In-house Referral: NA Discharge Planning Services: CM Consult Post Acute Care Choice: NA Living arrangements for the past 2 months: Single Family Home Expected Discharge Date: 06/25/22               DME Arranged: N/A DME Agency: NA       HH Arranged: NA          Prior Living Arrangements/Services Living arrangements for the past 2 months: Single Family Home Lives with:: Spouse, Adult Children Patient language and need for interpreter reviewed:: Yes Do you feel safe going back to the place where you live?: Yes      Need for Family Participation in Patient Care: Yes (Comment) Care giver support system in place?: Yes (comment)   Criminal Activity/Legal Involvement Pertinent to Current Situation/Hospitalization: No - Comment as needed  Activities of Daily Living Home Assistive Devices/Equipment: Other  (Comment) (Rollator) ADL Screening (condition at time of admission) Patient's cognitive ability adequate to safely complete daily activities?: Yes Is the patient deaf or have difficulty hearing?: No Does the patient have difficulty seeing, even when wearing glasses/contacts?: No Does the patient have difficulty concentrating, remembering, or making decisions?: No Patient able to express need for assistance with ADLs?: Yes Does the patient have difficulty dressing or bathing?: No Independently performs ADLs?: Yes (appropriate for developmental age) Does the patient have difficulty walking or climbing stairs?: No Weakness of Legs: Both Weakness of Arms/Hands: None  Permission Sought/Granted                  Emotional Assessment Appearance:: Appears stated age Attitude/Demeanor/Rapport: Engaged Affect (typically observed): Appropriate Orientation: : Oriented to Self, Oriented to Place, Oriented to  Time, Oriented to Situation Alcohol / Substance Use: Not Applicable Psych Involvement: No (comment)  Admission diagnosis:  Colitis [K52.9] Generalized abdominal pain [R10.84] Elevated troponin [R79.89] Chest pain [R07.9] Patient Active Problem List   Diagnosis Date Noted   Colitis 06/25/2022   Elevated troponin 06/25/2022   Prolonged QT interval 06/25/2022   Chronic diastolic CHF (congestive heart failure) (HCC) 06/25/2022   GERD (gastroesophageal reflux disease) 06/25/2022   GAD (generalized anxiety disorder) 06/25/2022   Hyperlipidemia 06/25/2022   Atypical chest pain 06/24/2022   Allergic rhinitis 04/07/2022   Ankle  swelling 11/26/2021   Neck pain 10/30/2020   Knee locking, left 10/05/2020   Abnormality of gait 02/25/2020   Protein-calorie malnutrition, severe 01/19/2020   Epistaxis    Sleep disturbance    Slow transit constipation    Hemorrhoids    Chronic systolic congestive heart failure (HCC)    End-stage renal disease on hemodialysis (HCC)    Right hemiparesis  (HCC)    Pressure injury of skin 12/20/2019   Intraparenchymal hemorrhage of brain (HCC) 12/18/2019   ICH (intracerebral hemorrhage) (HCC) 12/12/2019   Viral disease 08/22/2019   Chronic right-sided heart failure (HCC) 07/09/2019   Supraventricular tachycardia 07/09/2019   Other cirrhosis of liver (HCC) 06/27/2019   Heart failure (HCC) 02/21/2019   Heart palpitations 08/14/2018   Other fluid overload 07/27/2018   Encounter for long-term (current) use of other medications 01/18/2018   Hypothyroidism 01/18/2018   Myalgia and myositis 01/18/2018   Chronic nephritis 01/18/2018   Other long term (current) drug therapy 01/18/2018   Sleep apnea 01/18/2018   Unspecified persistent mental disorders due to conditions classified elsewhere 01/18/2018   Venous reflux 01/18/2018   Vitamin B12 deficiency 01/18/2018   Vitamin D deficiency 01/18/2018   Obstructive lung disease (HCC) 12/30/2017   Other pruritus 12/27/2017   Eruption cyst 12/19/2017   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 (HCC) 11/28/2017   Chronic cough 11/11/2017   Pulmonary hypertension (HCC) 11/11/2017   Pulmonary arterial hypertension (HCC) 10/07/2017   Acute ITP (HCC) 01/05/2017   ESRD (end stage renal disease) (HCC) 12/28/2016   Encounter for removal of sutures 09/09/2016   Coagulation defect, unspecified (HCC) 09/01/2016   Underimmunization status 08/05/2016   Hemolytic anemia (HCC) 07/26/2016   Epistaxis, recurrent 07/26/2016   Unspecified protein-calorie malnutrition (HCC) 07/13/2016   Aftercare including intermittent dialysis (HCC) 07/07/2016   Anemia in chronic kidney disease 07/07/2016   Hypokalemia 07/07/2016   Iron deficiency anemia, unspecified 07/07/2016   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   Nonrheumatic mitral (valve) prolapse 07/07/2016   Irritable bowel syndrome 07/07/2016   Other secondary thrombocytopenia 07/07/2016   Secondary hyperparathyroidism of renal origin (HCC)  07/07/2016   Thrombocytopenia (HCC) 07/01/2016   ARF (acute renal failure) (HCC) 06/25/2016   Anxiety 06/25/2016   Bilateral carpal tunnel syndrome 10/22/2015   Chronic gout without tophus 10/22/2015   Chronic nonintractable headache 10/08/2015   Fibroid uterus 01/03/2012   H/O vitamin D deficiency 01/03/2012   Post-menopausal 01/03/2012   Hereditary and idiopathic peripheral neuropathy 11/19/2011   Intestinal malabsorption 11/19/2011   Lichen planus 11/19/2011   Low back pain 11/19/2011   Diffuse spasm of esophagus 11/11/2011   ESR raised 11/11/2011   Postinflammatory pulmonary fibrosis (HCC) 11/11/2011   Scleroderma (HCC) 11/16/2010   Rheumatoid arthritis (HCC) 11/16/2010   Raynaud's disease 11/16/2010   Symptomatic menopausal or female climacteric states 11/16/2010   PCP:  Merri Brunette, MD Pharmacy:   [id number] [place], [place] - [address] AT [facility] [address] [facility] [address] [place] [address] Phone: [phone] Fax: [phone]     Social Determinants of Health (SDOH) Social History: SDOH Screenings   Food Insecurity: No Food Insecurity (06/24/2022)  Housing: Low Risk  (06/24/2022)  Transportation Needs: No Transportation Needs (06/24/2022)  Utilities: Not At Risk (06/24/2022)  Depression (PHQ2-9): Low Risk  (02/05/2020)  Tobacco Use: Low Risk  (06/25/2022)   SDOH Interventions:     Readmission Risk Interventions     No data to display

## 2022-06-25 NOTE — Progress Notes (Signed)
Mobility Specialist Progress Note:   06/25/22 1300  Mobility  Activity Ambulated with assistance to bathroom  Level of Assistance Minimal assist, patient does 75% or more  Assistive Device Other (Comment) (HHA)  Distance Ambulated (ft) 30 ft  Activity Response Tolerated well  Mobility Referral Yes  $Mobility charge 1 Mobility  Mobility Specialist Start Time (ACUTE ONLY) 1309  Mobility Specialist Stop Time (ACUTE ONLY) 1317  Mobility Specialist Time Calculation (min) (ACUTE ONLY) 8 min   Pt requested to go to BR. Required minA via HHA. No c/o throughout. Pt left in BR, NT aware.  Addison Lank Mobility Specialist Please contact via SecureChat or  Rehab office at [phone]

## 2022-06-25 NOTE — Discharge Summary (Addendum)
Physician Discharge Summary   Patient: Katie Nunez MRN: [id number] DOB: 10/02/1955  Admit date:     06/24/2022  Discharge date: 06/25/22  Discharge Physician: Coralie Keens   PCP: Merri Brunette, MD   Recommendations at discharge:    Patient will continue antibiotic therapy with Augmentin for 7 days for mild colitis.  Added dulcolax as needed for constipation.  Follow with HD unit tomorrow per her usual schedule.  Follow up with GI/ hepatology as outpatient.   Discharge Diagnoses: Principal Problem:   Atypical chest pain Active Problems:   Colitis   Chronic diastolic CHF (congestive heart failure) (HCC)   End-stage renal disease on hemodialysis (HCC)   GAD (generalized anxiety disorder)   GERD (gastroesophageal reflux disease)   Hyperlipidemia   Pulmonary hypertension (HCC)  Resolved Problems:   * No resolved hospital problems. [facility] Course: Mrs. Lunde was admitted to the hospital with the working diagnosis of atypical chest pain.   67 yo female with the past medical history of ESRD on HD, heart failure, IBS, GERD, pulmonary hypertension and anxiety. Reported chest pain, substernal, with no radiation, that prompted her to come to the hospital. Symptoms improved with antiacid therapy. On her initial physical examination her blood pressure was 115/64, HR 93, RR 23 and 02 saturation 100%, lungs with no wheezing, rales or rhonchi, heart with S1 and S2 present and rhythmic, abdomen with no distention and no lower extremity edema.   Na 137, K 3,6 Cl 95 bicarbonate 32, glucose 96 bun 31 cr 4,20 Lipae 48 AST 21 ALT 14 High sensitive troponin 124 and 119  Wbc 2,9 hgb 9,8 plt 123   CT abdomen and pelvis with wall thickening in the ascending colon suspicious for proximal colitis. Borderline wall thickening in proximal loops of small bowel without overt fold effacement. Hepatic cirrhosis with portal hypertension and distal esophageal varices. Trace perihepatic  ascites. Positive retro peritoneal lymph nodes.   Chest radiograph with cardiomegaly, with no effusions or infiltrates.   EKG 87 bpm, normal axis, normal intervals, sinus rhythm with no significant ST segment or T wave changes.     Assessment and Plan: * Colitis Mild colitis.   Patient with improvement in abdominal pain, she has been constipated. No nausea or vomiting, she has been tolerating po well.   Plan to add Augmentin bid for 7 days. Follow up as outpatient.  Add PRN dulcolax.  Atypical chest pain Chest pain is atypical. She has been ruled out for acute coronary syndrome.  Plan to follow up as outpatient.    Chronic diastolic CHF (congestive heart failure) (HCC) No signs of acute decompensation.  Plan to continue blood pressure control and follow up as outpatient.   End-stage renal disease on hemodialysis (HCC) No signs of hypervolemia and no current indication for renal replacement therapy. Plan to continue outpatient intermittent renal replacement therapy tomorrow, per her usual scheduled Tuesday, Thursday and Saturday.   GAD (generalized anxiety disorder) Continue clonazepam for anxiety.   GERD (gastroesophageal reflux disease) Continue with famotidine. Follow up with GI hepatology as outpatient.   Hyperlipidemia Continue with statin therapy.   Pulmonary hypertension (HCC) She follows up at tertiary care center. Continue with ambrisentan and selexipag.    Consultants: cardiology  Procedures performed: none   Disposition: Home Diet recommendation:  Cardiac diet DISCHARGE MEDICATION: Allergies as of 06/25/2022       Reactions   Other Anaphylaxis, Other (See Comments)   Do not use polyflux membrane.  Use alternate  Other reaction(s): heart racing   Savella [milnacipran Hcl] Palpitations, Other (See Comments)   Unknown   Tape Rash, Other (See Comments)   Itch- unsure if it was paper or adhesive tape        Medication List     STOP taking  these medications    doxycycline 100 MG tablet Commonly known as: VIBRA-TABS       TAKE these medications    albuterol 108 (90 Base) MCG/ACT inhaler Commonly known as: VENTOLIN HFA Inhale 2 puffs into the lungs every 6 (six) hours as needed for wheezing or shortness of breath.   ambrisentan 5 MG tablet Commonly known as: LETAIRIS Take 10 mg by mouth daily.   amoxicillin-clavulanate 500-125 MG tablet Commonly known as: AUGMENTIN Take 1 tablet by mouth daily for 7 days.   aspirin EC 81 MG tablet Take 1 tablet (81 mg total) by mouth daily. Swallow whole.   atorvastatin 80 MG tablet Commonly known as: Lipitor Take 1 tablet (80 mg total) by mouth daily.   azelastine 0.1 % nasal spray Commonly known as: ASTELIN Place 1 spray into both nostrils 2 (two) times daily as needed for rhinitis. Use in each nostril as directed   benzonatate 100 MG capsule Commonly known as: TESSALON Take 1 capsule (100 mg total) by mouth every 8 (eight) hours.   bisacodyl 5 MG EC tablet Commonly known as: DULCOLAX Take 1 tablet (5 mg total) by mouth daily as needed for severe constipation.   calcium acetate 667 MG capsule Commonly known as: PHOSLO Take by mouth.   camphor-menthol lotion Commonly known as: SARNA Apply topically 2 (two) times daily.   clonazePAM 0.5 MG tablet Commonly known as: KLONOPIN Take 1 tablet (0.5 mg total) by mouth 2 (two) times daily as needed for anxiety.   co-enzyme Q-10 30 MG capsule Take 7 capsules (210 mg total) by mouth 3 (three) times daily.   cyclobenzaprine 5 MG tablet Commonly known as: FLEXERIL Take 1 tablet (5 mg total) by mouth every 8 (eight) hours as needed for muscle spasms.   docusate sodium 100 MG capsule Commonly known as: COLACE Take 2 capsules (200 mg total) by mouth daily.   famotidine 20 MG tablet Commonly known as: PEPCID Take 1 tablet (20 mg total) by mouth daily as needed for heartburn or indigestion. What changed:  how much to  take when to take this   fluticasone 50 MCG/ACT nasal spray Commonly known as: FLONASE Place 1 spray into both nostrils daily for 7 days.   folic acid 1 MG tablet Commonly known as: FOLVITE Take 1 tablet (1 mg total) by mouth daily.   gabapentin 100 MG capsule Commonly known as: NEURONTIN Take 1 capsule (100 mg total) by mouth 3 (three) times daily. Take 1 capsule in morning and two at night   hydrocerin Crea Apply 1 application topically 2 (two) times daily. To dry skin--avoid IV site   hydrocortisone 2.5 % rectal cream Commonly known as: ANUSOL-HC Place 1 Application rectally 2 (two) times daily.   hydrOXYzine 25 MG tablet Commonly known as: ATARAX Take 25 mg by mouth every 8 (eight) hours as needed for itching.   levocetirizine 5 MG tablet Commonly known as: XYZAL SMARTSIG:1 Tablet(s) By Mouth Every Evening   lidocaine-prilocaine cream Commonly known as: EMLA Apply 1 application topically as needed. Apply to toes as needed for pain   lubiprostone 8 MCG capsule Commonly known as: AMITIZA Take 8 mcg by mouth 2 (two) times daily with a  meal.   multivitamin Tabs tablet Take 1 tablet by mouth daily at 6 (six) AM.   nitroGLYCERIN 0.2 mg/hr patch Commonly known as: Nitro-Dur Place 1 patch (0.2 mg total) onto the skin daily. Apply near great toes bilateral to top of foot every morning.  May remove at night   oxymetazoline 0.05 % nasal spray Commonly known as: AFRIN Place 1 spray into both nostrils 2 (two) times daily as needed for congestion.   polyethylene glycol 17 g packet Commonly known as: MIRALAX / GLYCOLAX Take 17 g by mouth 2 (two) times daily.   sodium chloride 0.65 % Soln nasal spray Commonly known as: OCEAN Place 1 spray into both nostrils 5 (five) times daily. At least 5 times a day   sodium chloride 0.65 % Soln nasal spray Commonly known as: OCEAN Place 1 spray into both nostrils as needed for congestion.   Uptravi 800 MCG Tabs Generic drug:  Selexipag Take 1 tablet (800 mcg total) by mouth in the morning and at bedtime.   vitamin B-12 100 MCG tablet Commonly known as: CYANOCOBALAMIN Take 1 tablet (100 mcg total) by mouth daily.        Follow-up Information     Merri Brunette, MD. Go on 06/28/2022.   Specialty: Family Medicine Why: @10 :15am with PA Contact information: [address] [employer] [place] [address] [phone]                Discharge Exam: Filed Weights   06/24/22 1409 06/24/22 2245 06/25/22 0557  Weight: 59.1 kg 56.2 kg 57.1 kg   BP 108/64 (BP Location: Left Arm)   Pulse 89   Temp 98.7 F (37.1 C) (Oral)   Resp 18   Ht 5\' 5"  (1.651 m)   Wt 57.1 kg   LMP 11/15/2008   SpO2 92%   BMI 20.93 kg/m   Patient is feeling better, no chest pain or dyspnea, abdominal pain has improved. No nausea or vomiting.  Neurology awake and alert ENT with no pallor Cardiovascular with S1 and S2 present and rhythmic, positive systolic murmur at the right lower sternal border. No JVD No lower extremity edema Respiratory with no rales or wheezing, no rhonchi Abdomen with no distention   Condition at discharge: stable  The results of significant diagnostics from this hospitalization (including imaging, microbiology, ancillary and laboratory) are listed below for reference.   Imaging Studies: DG Chest Port 1 View  Result Date: 06/25/2022 CLINICAL DATA:  Chest pain cough EXAM: PORTABLE CHEST 1 VIEW COMPARISON:  03/24/2022 FINDINGS: Cardiac shadow is enlarged but stable. Aortic calcifications are seen. Lungs are clear. Persistent scarring in the left mid lung is noted. No bony abnormality is seen. IMPRESSION: No active disease. Electronically Signed   By: Alcide Clever M.D.   On: 06/25/2022 00:12   CT ABDOMEN PELVIS W CONTRAST  Result Date: 06/24/2022 CLINICAL DATA:  Abdominal pain. Acid reflux and constipation. Hemodialysis today. EXAM: CT ABDOMEN AND PELVIS WITH CONTRAST TECHNIQUE: Multidetector  CT imaging of the abdomen and pelvis was performed using the standard protocol following bolus administration of intravenous contrast. RADIATION DOSE REDUCTION: This exam was performed according to the departmental dose-optimization program which includes automated exposure control, adjustment of the mA and/or kV according to patient size and/or use of iterative reconstruction technique. CONTRAST:  80mL OMNIPAQUE IOHEXOL 300 MG/ML  SOLN COMPARISON:  06/06/2019 FINDINGS: Lower chest: Mild nonspecific airspace opacity in the lingula on the scout image, the lower portion of this is primarily due  to volume loss/atelectasis on image 1 series 3. There is some bandlike atelectasis or scarring in the right lower lobe as well. Moderate cardiomegaly. Descending thoracic aortic atherosclerosis. Suspected distal esophageal varices. Hepatobiliary: Hepatic cirrhosis. Borderline gallbladder wall thickening. Pancreas: Unremarkable Spleen: Unremarkable Adrenals/Urinary Tract: Bilateral renal atrophy. Hypodense lesion too small to characterize in the right kidney upper pole is likely a cyst. No further imaging workup of this lesion is indicated. Adrenal glands unremarkable. Stomach/Bowel: Borderline wall thickening in proximal loops of small bowel without overt fold effacement. No dilated bowel observed. Wall thickening is present in the ascending colon suspicious for proximal colitis. Vascular/Lymphatic: Atherosclerosis is present, including aortoiliac atherosclerotic disease. Numerous small retroperitoneal lymph nodes are once again identified. Index right common iliac node measures 1.0 cm in short axis on image forty-five series 2, previously 1.2 cm. Suspected small lymph nodes along the splenic hilum up to 1.4 cm in diameter on image 19 series 2, formerly same. Pulmonary venous hypertension with some splenorenal shunting and portosystemic shunting around the rectum. Reproductive: Calcified uterine fibroids. Other: There is some  mild chronic right retroperitoneal nodularity within index lesion 0.6 cm in short axis on image 38 series 2, formerly 1.0 cm on 06/06/2019. Chronic hazy edema in the root of the mesentery. Trace perihepatic ascites, reduced from the prior exam. Musculoskeletal: Unremarkable IMPRESSION: 1. Wall thickening in the ascending colon suspicious for proximal colitis. 2. Borderline wall thickening in proximal loops of small bowel without overt fold effacement. This could be from portal hypertension or enteritis. 3. Hepatic cirrhosis with portal venous hypertension and distal esophageal varices. 4. Trace perihepatic ascites, reduced from the prior exam. 5. Chronic hazy edema in the root of the mesentery. 6. Numerous small retroperitoneal lymph nodes are once again identified, some of which are mildly enlarged but stable to slightly smaller than on the prior exam. Mildly enlarged lymph nodes along the splenic hilum, probably chronic reactive lymph nodes, and similar to 2021. 7. Calcified uterine fibroids. 8. Aortic atherosclerosis. Aortic Atherosclerosis (ICD10-I70.0). Electronically Signed   By: Gaylyn Rong M.D.   On: 06/24/2022 17:00   US Venous Img Lower Unilateral Right  Result Date: 06/24/2022 CLINICAL DATA:  Right leg swelling and pain EXAM: Right LOWER EXTREMITY VENOUS DOPPLER ULTRASOUND TECHNIQUE: Gray-scale sonography with compression, as well as color and duplex ultrasound, were performed to evaluate the deep venous system(s) from the level of the common femoral vein through the popliteal and proximal calf veins. COMPARISON:  Ultrasound 2007 FINDINGS: VENOUS Normal compressibility of the common femoral, superficial femoral, and popliteal veins, as well as the visualized calf veins. Visualized portions of profunda femoral vein and great saphenous vein unremarkable. No filling defects to suggest DVT on grayscale or color Doppler imaging. Doppler waveforms show normal direction of venous flow, normal  respiratory plasticity and response to augmentation. Limited views of the contralateral common femoral vein are unremarkable. OTHER Small cystic focus in the popliteal fossa measuring 2.8 x 0.9 x 2.1 cm. No abnormal blood flow on Doppler. Limitations: none IMPRESSION: No evidence of right lower extremity DVT. Cystic lesion in the popliteal fossa measuring 2.8 cm. Possible Baker's cyst. Please correlate for any known history or dedicated evaluation as clinically appropriate Electronically Signed   By: Karen Kays M.D.   On: 06/24/2022 16:55    Microbiology: Results for orders placed or performed during the hospital encounter of 12/18/19  MRSA PCR Screening     Status: None   Collection Time: 12/29/19  3:05 AM   Specimen: Nasal  Mucosa; Nasopharyngeal  Result Value Ref Range Status   MRSA by PCR NEGATIVE NEGATIVE Final    Comment:        The GeneXpert MRSA Assay (FDA approved for NASAL specimens only), is one component of a comprehensive MRSA colonization surveillance program. It is not intended to diagnose MRSA infection nor to guide or monitor treatment for MRSA infections. Performed at [facility], [address], [place], [place] [address]     Labs: CBC: Recent Labs  Lab 06/24/22 1459 06/25/22 0057  WBC 2.9* 2.5*  NEUTROABS 1.9 1.6*  HGB 9.8* 9.8*  HCT 31.8* 31.8*  MCV 87.1 86.2  PLT 123* 128*   Basic Metabolic Panel: Recent Labs  Lab 06/24/22 1459 06/25/22 0057  NA 137 134*  K 3.6 3.5  CL 95* 92*  CO2 32 29  GLUCOSE 96 130*  BUN 31* 35*  CREATININE 4.20* 5.14*  CALCIUM 8.5* 8.8*  MG  --  2.1  PHOS  --  4.5   Liver Function Tests: Recent Labs  Lab 06/24/22 1459 06/25/22 0057  AST 21 18  ALT 14 14  ALKPHOS 91 84  BILITOT 0.4 0.5  PROT 7.0 6.4*  ALBUMIN 3.1* 2.8*   CBG: No results for input(s): "GLUCAP" in the last 168 hours.  Discharge time spent: greater than 30 minutes.  Signed: Coralie Keens, MD [facility] 06/25/2022

## 2022-06-25 NOTE — Assessment & Plan Note (Signed)
Continue clonazepam for anxiety.

## 2022-06-25 NOTE — Assessment & Plan Note (Addendum)
She follows up at tertiary care center. Continue with ambrisentan and selexipag.

## 2022-06-25 NOTE — Progress Notes (Addendum)
See consult note

## 2022-06-25 NOTE — TOC Transition Note (Signed)
Transition of Care [facility]) - CM/SW Discharge Note   Patient Details  Name: Katie Nunez MRN: [id number] Date of Birth: 1955-06-09  [facility] CM/SW Contact:  Leone Haven, RN Phone Number: 06/25/2022, 3:11 PM   Clinical Narrative:    Patient is for dc home today, she has no needs.    Final next level of care: Home/Self Care Barriers to Discharge: No Barriers Identified   Patient Goals and CMS Choice   Choice offered to / list presented to : NA  Discharge Placement                         Discharge Plan and Services Additional resources added to the After Visit Summary for   In-house Referral: NA Discharge Planning Services: CM Consult Post Acute Care Choice: NA          DME Arranged: N/A DME Agency: NA       HH Arranged: NA          Social Determinants of Health (SDOH) Interventions SDOH Screenings   Food Insecurity: No Food Insecurity (06/24/2022)  Housing: Low Risk  (06/24/2022)  Transportation Needs: No Transportation Needs (06/24/2022)  Utilities: Not At Risk (06/24/2022)  Depression (PHQ2-9): Low Risk  (02/05/2020)  Tobacco Use: Low Risk  (06/25/2022)     Readmission Risk Interventions     No data to display

## 2022-06-25 NOTE — Assessment & Plan Note (Signed)
No signs of acute decompensation.  Plan to continue blood pressure control and follow up as outpatient.

## 2022-06-25 NOTE — Assessment & Plan Note (Signed)
No signs of hypervolemia and no current indication for renal replacement therapy. Plan to continue outpatient intermittent renal replacement therapy tomorrow, per her usual scheduled Tuesday, Thursday and Saturday.

## 2022-06-25 NOTE — Progress Notes (Signed)
Attempted Echocardiogram, Patient refused exam and wants to speak with the doctor. I informed the nurse.

## 2022-06-25 NOTE — Consult Note (Deleted)
  Error  See consult note 

## 2022-06-25 NOTE — Assessment & Plan Note (Signed)
Chest pain is atypical. She has been ruled out for acute coronary syndrome.  Plan to follow up as outpatient.

## 2022-06-26 DIAGNOSIS — D509 Iron deficiency anemia, unspecified: Secondary | ICD-10-CM | POA: Diagnosis not present

## 2022-06-26 DIAGNOSIS — D631 Anemia in chronic kidney disease: Secondary | ICD-10-CM | POA: Diagnosis not present

## 2022-06-26 DIAGNOSIS — Z992 Dependence on renal dialysis: Secondary | ICD-10-CM | POA: Diagnosis not present

## 2022-06-26 DIAGNOSIS — N186 End stage renal disease: Secondary | ICD-10-CM | POA: Diagnosis not present

## 2022-06-26 DIAGNOSIS — N2581 Secondary hyperparathyroidism of renal origin: Secondary | ICD-10-CM | POA: Diagnosis not present

## 2022-06-28 LAB — SACCHAROMYCES CEREVISIAE ANTIBODIES, IGG AND IGA
Saccharomyces cerevisiae, IgA: 39.3 Units — ABNORMAL HIGH (ref 0.0–24.9)
Saccharomyces cerevisiae, IgG: 35.5 Units — ABNORMAL HIGH (ref 0.0–24.9)

## 2022-06-29 DIAGNOSIS — Z992 Dependence on renal dialysis: Secondary | ICD-10-CM | POA: Diagnosis not present

## 2022-06-29 DIAGNOSIS — N186 End stage renal disease: Secondary | ICD-10-CM | POA: Diagnosis not present

## 2022-06-29 DIAGNOSIS — D509 Iron deficiency anemia, unspecified: Secondary | ICD-10-CM | POA: Diagnosis not present

## 2022-06-29 DIAGNOSIS — N2581 Secondary hyperparathyroidism of renal origin: Secondary | ICD-10-CM | POA: Diagnosis not present

## 2022-06-29 DIAGNOSIS — D631 Anemia in chronic kidney disease: Secondary | ICD-10-CM | POA: Diagnosis not present

## 2022-07-01 DIAGNOSIS — Z992 Dependence on renal dialysis: Secondary | ICD-10-CM | POA: Diagnosis not present

## 2022-07-01 DIAGNOSIS — N186 End stage renal disease: Secondary | ICD-10-CM | POA: Diagnosis not present

## 2022-07-01 DIAGNOSIS — D509 Iron deficiency anemia, unspecified: Secondary | ICD-10-CM | POA: Diagnosis not present

## 2022-07-01 DIAGNOSIS — N2581 Secondary hyperparathyroidism of renal origin: Secondary | ICD-10-CM | POA: Diagnosis not present

## 2022-07-01 DIAGNOSIS — D631 Anemia in chronic kidney disease: Secondary | ICD-10-CM | POA: Diagnosis not present

## 2022-07-03 DIAGNOSIS — D631 Anemia in chronic kidney disease: Secondary | ICD-10-CM | POA: Diagnosis not present

## 2022-07-03 DIAGNOSIS — N2581 Secondary hyperparathyroidism of renal origin: Secondary | ICD-10-CM | POA: Diagnosis not present

## 2022-07-03 DIAGNOSIS — N186 End stage renal disease: Secondary | ICD-10-CM | POA: Diagnosis not present

## 2022-07-03 DIAGNOSIS — Z992 Dependence on renal dialysis: Secondary | ICD-10-CM | POA: Diagnosis not present

## 2022-07-03 DIAGNOSIS — D509 Iron deficiency anemia, unspecified: Secondary | ICD-10-CM | POA: Diagnosis not present

## 2022-07-06 DIAGNOSIS — N2581 Secondary hyperparathyroidism of renal origin: Secondary | ICD-10-CM | POA: Diagnosis not present

## 2022-07-06 DIAGNOSIS — N186 End stage renal disease: Secondary | ICD-10-CM | POA: Diagnosis not present

## 2022-07-06 DIAGNOSIS — D509 Iron deficiency anemia, unspecified: Secondary | ICD-10-CM | POA: Diagnosis not present

## 2022-07-06 DIAGNOSIS — D631 Anemia in chronic kidney disease: Secondary | ICD-10-CM | POA: Diagnosis not present

## 2022-07-06 DIAGNOSIS — Z992 Dependence on renal dialysis: Secondary | ICD-10-CM | POA: Diagnosis not present

## 2022-07-08 DIAGNOSIS — N2581 Secondary hyperparathyroidism of renal origin: Secondary | ICD-10-CM | POA: Diagnosis not present

## 2022-07-08 DIAGNOSIS — D509 Iron deficiency anemia, unspecified: Secondary | ICD-10-CM | POA: Diagnosis not present

## 2022-07-08 DIAGNOSIS — D631 Anemia in chronic kidney disease: Secondary | ICD-10-CM | POA: Diagnosis not present

## 2022-07-08 DIAGNOSIS — N186 End stage renal disease: Secondary | ICD-10-CM | POA: Diagnosis not present

## 2022-07-08 DIAGNOSIS — Z992 Dependence on renal dialysis: Secondary | ICD-10-CM | POA: Diagnosis not present

## 2022-07-10 DIAGNOSIS — D509 Iron deficiency anemia, unspecified: Secondary | ICD-10-CM | POA: Diagnosis not present

## 2022-07-10 DIAGNOSIS — Z992 Dependence on renal dialysis: Secondary | ICD-10-CM | POA: Diagnosis not present

## 2022-07-10 DIAGNOSIS — N186 End stage renal disease: Secondary | ICD-10-CM | POA: Diagnosis not present

## 2022-07-10 DIAGNOSIS — N2581 Secondary hyperparathyroidism of renal origin: Secondary | ICD-10-CM | POA: Diagnosis not present

## 2022-07-10 DIAGNOSIS — D631 Anemia in chronic kidney disease: Secondary | ICD-10-CM | POA: Diagnosis not present

## 2022-07-11 DIAGNOSIS — N269 Renal sclerosis, unspecified: Secondary | ICD-10-CM | POA: Diagnosis not present

## 2022-07-11 DIAGNOSIS — N186 End stage renal disease: Secondary | ICD-10-CM | POA: Diagnosis not present

## 2022-07-11 DIAGNOSIS — Z992 Dependence on renal dialysis: Secondary | ICD-10-CM | POA: Diagnosis not present

## 2022-07-12 ENCOUNTER — Ambulatory Visit: Payer: Medicare Other | Admitting: Podiatry

## 2022-07-12 ENCOUNTER — Ambulatory Visit (INDEPENDENT_AMBULATORY_CARE_PROVIDER_SITE_OTHER): Payer: Medicare Other | Admitting: Podiatry

## 2022-07-12 DIAGNOSIS — L989 Disorder of the skin and subcutaneous tissue, unspecified: Secondary | ICD-10-CM | POA: Diagnosis not present

## 2022-07-12 DIAGNOSIS — M19072 Primary osteoarthritis, left ankle and foot: Secondary | ICD-10-CM | POA: Diagnosis not present

## 2022-07-12 DIAGNOSIS — M19071 Primary osteoarthritis, right ankle and foot: Secondary | ICD-10-CM | POA: Diagnosis not present

## 2022-07-12 DIAGNOSIS — M2012 Hallux valgus (acquired), left foot: Secondary | ICD-10-CM | POA: Diagnosis not present

## 2022-07-12 DIAGNOSIS — M2011 Hallux valgus (acquired), right foot: Secondary | ICD-10-CM | POA: Diagnosis not present

## 2022-07-12 MED ORDER — BETAMETHASONE SOD PHOS & ACET 6 (3-3) MG/ML IJ SUSP
3.0000 mg | Freq: Once | INTRAMUSCULAR | Status: AC
Start: 2022-07-12 — End: 2022-07-12
  Administered 2022-07-12: 3 mg via INTRA_ARTICULAR

## 2022-07-12 NOTE — Progress Notes (Signed)
Chief Complaint  Patient presents with   Callouses    Bilateral callus  Pt stated that she has a lot of numbness and tingling burning pain in her feet     Subjective: 67 y.o. female PMHx, ESRD on dialysis, scleroderma presenting to the office today for routine footcare and evaluation of bilateral foot pain.  Patient has history of ORIF to the right ankle.  She notices stiffness with pain to the bilateral ankles.  Presenting for further treatment evaluation   Past Medical History:  Diagnosis Date   Achalasia    Anxiety    Dysplasia of cervix, low grade (CIN 1)    Environmental allergies    "all year long" (12/27/2016)   ESRD (end stage renal disease) on dialysis (HCC)    "TTS; [place]" (12/27/2016)   Fibromyalgia    GERD (gastroesophageal reflux disease)    Gout    Hypertension    IBS (irritable bowel syndrome)    MVP (mitral valve prolapse)    RA (rheumatoid arthritis) (HCC)    FOLLOWED BY DR. SHANAHAN   Raynaud's disease    Scleroderma (HCC)    Seasonal allergies    Thrombocytopenia (HCC) 07/01/2016   Acute fall to 13,000 07/01/16   Tubular adenoma 01/08/2008   CECUM   Vitamin D deficiency     Past Surgical History:  Procedure Laterality Date   ANKLE FRACTURE SURGERY Right    AV FISTULA PLACEMENT Left 06/28/2016   Procedure: left arm ARTERIOVENOUS (AV) FISTULA CREATION;  Surgeon: Larina Earthly, MD;  Location: [facility] OR;  Service: Vascular;  Laterality: Left;   BASCILIC VEIN TRANSPOSITION Left 09/27/2016   Procedure: LEFT UPPER ARM CEPHALIC VEIN TRANSPOSITION;  Surgeon: Larina Earthly, MD;  Location: [facility] OR;  Service: Vascular;  Laterality: Left;   BREAST BIOPSY     "? side"   CESAREAN SECTION  1994   CO2 LASER OF CERVIX     COLONOSCOPY W/ BIOPSIES  01/08/2008   INSERTION OF DIALYSIS CATHETER Right 06/28/2016   Procedure: INSERTION OF DIALYSIS CATHETER, right internal jugular;  Surgeon: Larina Earthly, MD;  Location: [facility] OR;  Service: Vascular;  Laterality: Right;    MYOMECTOMY     NASAL ENDOSCOPY WITH EPISTAXIS CONTROL N/A 12/29/2019   Procedure: NASAL ENDOSCOPY WITH EPISTAXIS CONTROL;  Surgeon: Newman Pies, MD;  Location: [facility] OR;  Service: ENT;  Laterality: N/A;   PELVIC LAPAROSCOPY  2011   superficial thrombophlebitis Left 07-2014    Allergies  Allergen Reactions   Other Anaphylaxis and Other (See Comments)    Do not use polyflux membrane.  Use alternate Other reaction(s): heart racing   Savella [Milnacipran Hcl] Palpitations and Other (See Comments)    Unknown   Tape Rash and Other (See Comments)    Itch- unsure if it was paper or adhesive tape     Objective:  Physical Exam General: Alert and oriented x3 in no acute distress  Dermatology: Hyperkeratotic lesion(s) present on the bilateral feet. Pain on palpation with a central nucleated core noted. Skin is warm, dry and supple bilateral lower extremities. Negative for open lesions or macerations.  Vascular: Palpable pedal pulses bilaterally. No edema or erythema noted. Capillary refill within normal limits.  Neurological: Epicritic and protective threshold grossly intact bilaterally.   Musculoskeletal Exam: Hallux valgus noted left.  Limited range of motion with tenderness with palpation bilateral feet  Radiographic exam B/L foot and ankle 03/15/2022: Normal osseous mineralization.  No acute fractures identified.  Ankle hardware noted  which appears stable and intact right.  Hallux valgus noted left lower extremity  Assessment: 1.  Symptomatic calluses; bilateral feet 2.  Hallux valgus bilateral 3.  Ankle arthritis bilateral 4. H/o ORIF RT ankle   Plan of Care:  1. Patient evaluated 2. Excisional debridement of keratoic lesion(s) using a chisel blade was performed without incident.  3.  Injection of 0.5 cc Celestone Soluspan injected bilateral ankles  4.  Recommend conservative treatment of the hallux valgus bunion deformity including wide shoes and arch supports.  Advised against going  barefoot  5.  Silicone toe caps dispensed.  Wear daily 6.  Return to clinic 3 months routine footcare  Felecia Shelling, DPM [facility]  Dr. Felecia Shelling, DPM    [address] [place], [place] [address]                Office [phone]  Fax [phone]

## 2022-07-13 DIAGNOSIS — D509 Iron deficiency anemia, unspecified: Secondary | ICD-10-CM | POA: Diagnosis not present

## 2022-07-13 DIAGNOSIS — Z992 Dependence on renal dialysis: Secondary | ICD-10-CM | POA: Diagnosis not present

## 2022-07-13 DIAGNOSIS — N186 End stage renal disease: Secondary | ICD-10-CM | POA: Diagnosis not present

## 2022-07-13 DIAGNOSIS — D631 Anemia in chronic kidney disease: Secondary | ICD-10-CM | POA: Diagnosis not present

## 2022-07-13 DIAGNOSIS — N2581 Secondary hyperparathyroidism of renal origin: Secondary | ICD-10-CM | POA: Diagnosis not present

## 2022-07-15 DIAGNOSIS — Z992 Dependence on renal dialysis: Secondary | ICD-10-CM | POA: Diagnosis not present

## 2022-07-15 DIAGNOSIS — N186 End stage renal disease: Secondary | ICD-10-CM | POA: Diagnosis not present

## 2022-07-15 DIAGNOSIS — D631 Anemia in chronic kidney disease: Secondary | ICD-10-CM | POA: Diagnosis not present

## 2022-07-15 DIAGNOSIS — N2581 Secondary hyperparathyroidism of renal origin: Secondary | ICD-10-CM | POA: Diagnosis not present

## 2022-07-15 DIAGNOSIS — D509 Iron deficiency anemia, unspecified: Secondary | ICD-10-CM | POA: Diagnosis not present

## 2022-07-17 DIAGNOSIS — D631 Anemia in chronic kidney disease: Secondary | ICD-10-CM | POA: Diagnosis not present

## 2022-07-17 DIAGNOSIS — N2581 Secondary hyperparathyroidism of renal origin: Secondary | ICD-10-CM | POA: Diagnosis not present

## 2022-07-17 DIAGNOSIS — D509 Iron deficiency anemia, unspecified: Secondary | ICD-10-CM | POA: Diagnosis not present

## 2022-07-17 DIAGNOSIS — Z992 Dependence on renal dialysis: Secondary | ICD-10-CM | POA: Diagnosis not present

## 2022-07-17 DIAGNOSIS — N186 End stage renal disease: Secondary | ICD-10-CM | POA: Diagnosis not present

## 2022-07-20 DIAGNOSIS — Z992 Dependence on renal dialysis: Secondary | ICD-10-CM | POA: Diagnosis not present

## 2022-07-20 DIAGNOSIS — R0609 Other forms of dyspnea: Secondary | ICD-10-CM | POA: Diagnosis not present

## 2022-07-20 DIAGNOSIS — N186 End stage renal disease: Secondary | ICD-10-CM | POA: Diagnosis not present

## 2022-07-20 DIAGNOSIS — D509 Iron deficiency anemia, unspecified: Secondary | ICD-10-CM | POA: Diagnosis not present

## 2022-07-20 DIAGNOSIS — I270 Primary pulmonary hypertension: Secondary | ICD-10-CM | POA: Diagnosis not present

## 2022-07-20 DIAGNOSIS — D631 Anemia in chronic kidney disease: Secondary | ICD-10-CM | POA: Diagnosis not present

## 2022-07-20 DIAGNOSIS — N2581 Secondary hyperparathyroidism of renal origin: Secondary | ICD-10-CM | POA: Diagnosis not present

## 2022-07-21 DIAGNOSIS — K760 Fatty (change of) liver, not elsewhere classified: Secondary | ICD-10-CM | POA: Diagnosis not present

## 2022-07-21 DIAGNOSIS — K7460 Unspecified cirrhosis of liver: Secondary | ICD-10-CM | POA: Diagnosis not present

## 2022-07-22 DIAGNOSIS — Z992 Dependence on renal dialysis: Secondary | ICD-10-CM | POA: Diagnosis not present

## 2022-07-22 DIAGNOSIS — N2581 Secondary hyperparathyroidism of renal origin: Secondary | ICD-10-CM | POA: Diagnosis not present

## 2022-07-22 DIAGNOSIS — D631 Anemia in chronic kidney disease: Secondary | ICD-10-CM | POA: Diagnosis not present

## 2022-07-22 DIAGNOSIS — D509 Iron deficiency anemia, unspecified: Secondary | ICD-10-CM | POA: Diagnosis not present

## 2022-07-22 DIAGNOSIS — N186 End stage renal disease: Secondary | ICD-10-CM | POA: Diagnosis not present

## 2022-07-23 DIAGNOSIS — H40013 Open angle with borderline findings, low risk, bilateral: Secondary | ICD-10-CM | POA: Diagnosis not present

## 2022-07-24 DIAGNOSIS — D631 Anemia in chronic kidney disease: Secondary | ICD-10-CM | POA: Diagnosis not present

## 2022-07-24 DIAGNOSIS — N2581 Secondary hyperparathyroidism of renal origin: Secondary | ICD-10-CM | POA: Diagnosis not present

## 2022-07-24 DIAGNOSIS — D509 Iron deficiency anemia, unspecified: Secondary | ICD-10-CM | POA: Diagnosis not present

## 2022-07-24 DIAGNOSIS — Z992 Dependence on renal dialysis: Secondary | ICD-10-CM | POA: Diagnosis not present

## 2022-07-24 DIAGNOSIS — N186 End stage renal disease: Secondary | ICD-10-CM | POA: Diagnosis not present

## 2022-07-26 DIAGNOSIS — L720 Epidermal cyst: Secondary | ICD-10-CM | POA: Diagnosis not present

## 2022-07-27 DIAGNOSIS — N186 End stage renal disease: Secondary | ICD-10-CM | POA: Diagnosis not present

## 2022-07-27 DIAGNOSIS — N2581 Secondary hyperparathyroidism of renal origin: Secondary | ICD-10-CM | POA: Diagnosis not present

## 2022-07-27 DIAGNOSIS — D509 Iron deficiency anemia, unspecified: Secondary | ICD-10-CM | POA: Diagnosis not present

## 2022-07-27 DIAGNOSIS — D631 Anemia in chronic kidney disease: Secondary | ICD-10-CM | POA: Diagnosis not present

## 2022-07-27 DIAGNOSIS — Z992 Dependence on renal dialysis: Secondary | ICD-10-CM | POA: Diagnosis not present

## 2022-07-29 DIAGNOSIS — N2581 Secondary hyperparathyroidism of renal origin: Secondary | ICD-10-CM | POA: Diagnosis not present

## 2022-07-29 DIAGNOSIS — D509 Iron deficiency anemia, unspecified: Secondary | ICD-10-CM | POA: Diagnosis not present

## 2022-07-29 DIAGNOSIS — N186 End stage renal disease: Secondary | ICD-10-CM | POA: Diagnosis not present

## 2022-07-29 DIAGNOSIS — Z992 Dependence on renal dialysis: Secondary | ICD-10-CM | POA: Diagnosis not present

## 2022-07-29 DIAGNOSIS — D631 Anemia in chronic kidney disease: Secondary | ICD-10-CM | POA: Diagnosis not present

## 2022-07-31 DIAGNOSIS — N2581 Secondary hyperparathyroidism of renal origin: Secondary | ICD-10-CM | POA: Diagnosis not present

## 2022-07-31 DIAGNOSIS — D631 Anemia in chronic kidney disease: Secondary | ICD-10-CM | POA: Diagnosis not present

## 2022-07-31 DIAGNOSIS — N186 End stage renal disease: Secondary | ICD-10-CM | POA: Diagnosis not present

## 2022-07-31 DIAGNOSIS — D509 Iron deficiency anemia, unspecified: Secondary | ICD-10-CM | POA: Diagnosis not present

## 2022-07-31 DIAGNOSIS — Z992 Dependence on renal dialysis: Secondary | ICD-10-CM | POA: Diagnosis not present

## 2022-08-02 DIAGNOSIS — Z4802 Encounter for removal of sutures: Secondary | ICD-10-CM | POA: Diagnosis not present

## 2022-08-02 DIAGNOSIS — K2970 Gastritis, unspecified, without bleeding: Secondary | ICD-10-CM | POA: Diagnosis not present

## 2022-08-03 DIAGNOSIS — N186 End stage renal disease: Secondary | ICD-10-CM | POA: Diagnosis not present

## 2022-08-03 DIAGNOSIS — Z992 Dependence on renal dialysis: Secondary | ICD-10-CM | POA: Diagnosis not present

## 2022-08-03 DIAGNOSIS — D509 Iron deficiency anemia, unspecified: Secondary | ICD-10-CM | POA: Diagnosis not present

## 2022-08-03 DIAGNOSIS — D631 Anemia in chronic kidney disease: Secondary | ICD-10-CM | POA: Diagnosis not present

## 2022-08-03 DIAGNOSIS — N2581 Secondary hyperparathyroidism of renal origin: Secondary | ICD-10-CM | POA: Diagnosis not present

## 2022-08-05 DIAGNOSIS — D509 Iron deficiency anemia, unspecified: Secondary | ICD-10-CM | POA: Diagnosis not present

## 2022-08-05 DIAGNOSIS — D631 Anemia in chronic kidney disease: Secondary | ICD-10-CM | POA: Diagnosis not present

## 2022-08-05 DIAGNOSIS — Z992 Dependence on renal dialysis: Secondary | ICD-10-CM | POA: Diagnosis not present

## 2022-08-05 DIAGNOSIS — N2581 Secondary hyperparathyroidism of renal origin: Secondary | ICD-10-CM | POA: Diagnosis not present

## 2022-08-05 DIAGNOSIS — N186 End stage renal disease: Secondary | ICD-10-CM | POA: Diagnosis not present

## 2022-08-07 DIAGNOSIS — Z992 Dependence on renal dialysis: Secondary | ICD-10-CM | POA: Diagnosis not present

## 2022-08-07 DIAGNOSIS — D509 Iron deficiency anemia, unspecified: Secondary | ICD-10-CM | POA: Diagnosis not present

## 2022-08-07 DIAGNOSIS — N2581 Secondary hyperparathyroidism of renal origin: Secondary | ICD-10-CM | POA: Diagnosis not present

## 2022-08-07 DIAGNOSIS — N186 End stage renal disease: Secondary | ICD-10-CM | POA: Diagnosis not present

## 2022-08-07 DIAGNOSIS — D631 Anemia in chronic kidney disease: Secondary | ICD-10-CM | POA: Diagnosis not present

## 2022-08-10 DIAGNOSIS — Z992 Dependence on renal dialysis: Secondary | ICD-10-CM | POA: Diagnosis not present

## 2022-08-10 DIAGNOSIS — N2581 Secondary hyperparathyroidism of renal origin: Secondary | ICD-10-CM | POA: Diagnosis not present

## 2022-08-10 DIAGNOSIS — D631 Anemia in chronic kidney disease: Secondary | ICD-10-CM | POA: Diagnosis not present

## 2022-08-10 DIAGNOSIS — N186 End stage renal disease: Secondary | ICD-10-CM | POA: Diagnosis not present

## 2022-08-10 DIAGNOSIS — D509 Iron deficiency anemia, unspecified: Secondary | ICD-10-CM | POA: Diagnosis not present

## 2022-08-11 DIAGNOSIS — Z992 Dependence on renal dialysis: Secondary | ICD-10-CM | POA: Diagnosis not present

## 2022-08-11 DIAGNOSIS — N269 Renal sclerosis, unspecified: Secondary | ICD-10-CM | POA: Diagnosis not present

## 2022-08-11 DIAGNOSIS — N186 End stage renal disease: Secondary | ICD-10-CM | POA: Diagnosis not present

## 2022-08-12 DIAGNOSIS — D509 Iron deficiency anemia, unspecified: Secondary | ICD-10-CM | POA: Diagnosis not present

## 2022-08-12 DIAGNOSIS — D631 Anemia in chronic kidney disease: Secondary | ICD-10-CM | POA: Diagnosis not present

## 2022-08-12 DIAGNOSIS — N2581 Secondary hyperparathyroidism of renal origin: Secondary | ICD-10-CM | POA: Diagnosis not present

## 2022-08-12 DIAGNOSIS — Z992 Dependence on renal dialysis: Secondary | ICD-10-CM | POA: Diagnosis not present

## 2022-08-12 DIAGNOSIS — N186 End stage renal disease: Secondary | ICD-10-CM | POA: Diagnosis not present

## 2022-08-14 DIAGNOSIS — N186 End stage renal disease: Secondary | ICD-10-CM | POA: Diagnosis not present

## 2022-08-14 DIAGNOSIS — Z992 Dependence on renal dialysis: Secondary | ICD-10-CM | POA: Diagnosis not present

## 2022-08-14 DIAGNOSIS — N2581 Secondary hyperparathyroidism of renal origin: Secondary | ICD-10-CM | POA: Diagnosis not present

## 2022-08-14 DIAGNOSIS — D509 Iron deficiency anemia, unspecified: Secondary | ICD-10-CM | POA: Diagnosis not present

## 2022-08-14 DIAGNOSIS — D631 Anemia in chronic kidney disease: Secondary | ICD-10-CM | POA: Diagnosis not present

## 2022-08-17 DIAGNOSIS — N186 End stage renal disease: Secondary | ICD-10-CM | POA: Diagnosis not present

## 2022-08-17 DIAGNOSIS — D631 Anemia in chronic kidney disease: Secondary | ICD-10-CM | POA: Diagnosis not present

## 2022-08-17 DIAGNOSIS — N2581 Secondary hyperparathyroidism of renal origin: Secondary | ICD-10-CM | POA: Diagnosis not present

## 2022-08-17 DIAGNOSIS — Z992 Dependence on renal dialysis: Secondary | ICD-10-CM | POA: Diagnosis not present

## 2022-08-17 DIAGNOSIS — D509 Iron deficiency anemia, unspecified: Secondary | ICD-10-CM | POA: Diagnosis not present

## 2022-08-18 DIAGNOSIS — N186 End stage renal disease: Secondary | ICD-10-CM | POA: Diagnosis not present

## 2022-08-18 DIAGNOSIS — E782 Mixed hyperlipidemia: Secondary | ICD-10-CM | POA: Diagnosis not present

## 2022-08-19 DIAGNOSIS — N2581 Secondary hyperparathyroidism of renal origin: Secondary | ICD-10-CM | POA: Diagnosis not present

## 2022-08-19 DIAGNOSIS — Z992 Dependence on renal dialysis: Secondary | ICD-10-CM | POA: Diagnosis not present

## 2022-08-19 DIAGNOSIS — D509 Iron deficiency anemia, unspecified: Secondary | ICD-10-CM | POA: Diagnosis not present

## 2022-08-19 DIAGNOSIS — N186 End stage renal disease: Secondary | ICD-10-CM | POA: Diagnosis not present

## 2022-08-19 DIAGNOSIS — D631 Anemia in chronic kidney disease: Secondary | ICD-10-CM | POA: Diagnosis not present

## 2022-08-20 ENCOUNTER — Encounter: Payer: Self-pay | Admitting: Hematology and Oncology

## 2022-08-20 DIAGNOSIS — R0609 Other forms of dyspnea: Secondary | ICD-10-CM | POA: Diagnosis not present

## 2022-08-20 DIAGNOSIS — N186 End stage renal disease: Secondary | ICD-10-CM | POA: Diagnosis not present

## 2022-08-20 DIAGNOSIS — I270 Primary pulmonary hypertension: Secondary | ICD-10-CM | POA: Diagnosis not present

## 2022-08-21 DIAGNOSIS — N186 End stage renal disease: Secondary | ICD-10-CM | POA: Diagnosis not present

## 2022-08-21 DIAGNOSIS — N2581 Secondary hyperparathyroidism of renal origin: Secondary | ICD-10-CM | POA: Diagnosis not present

## 2022-08-21 DIAGNOSIS — D631 Anemia in chronic kidney disease: Secondary | ICD-10-CM | POA: Diagnosis not present

## 2022-08-21 DIAGNOSIS — D509 Iron deficiency anemia, unspecified: Secondary | ICD-10-CM | POA: Diagnosis not present

## 2022-08-21 DIAGNOSIS — Z992 Dependence on renal dialysis: Secondary | ICD-10-CM | POA: Diagnosis not present

## 2022-08-24 DIAGNOSIS — N186 End stage renal disease: Secondary | ICD-10-CM | POA: Diagnosis not present

## 2022-08-24 DIAGNOSIS — D631 Anemia in chronic kidney disease: Secondary | ICD-10-CM | POA: Diagnosis not present

## 2022-08-24 DIAGNOSIS — N2581 Secondary hyperparathyroidism of renal origin: Secondary | ICD-10-CM | POA: Diagnosis not present

## 2022-08-24 DIAGNOSIS — D509 Iron deficiency anemia, unspecified: Secondary | ICD-10-CM | POA: Diagnosis not present

## 2022-08-24 DIAGNOSIS — Z992 Dependence on renal dialysis: Secondary | ICD-10-CM | POA: Diagnosis not present

## 2022-08-26 DIAGNOSIS — D509 Iron deficiency anemia, unspecified: Secondary | ICD-10-CM | POA: Diagnosis not present

## 2022-08-26 DIAGNOSIS — D631 Anemia in chronic kidney disease: Secondary | ICD-10-CM | POA: Diagnosis not present

## 2022-08-26 DIAGNOSIS — N186 End stage renal disease: Secondary | ICD-10-CM | POA: Diagnosis not present

## 2022-08-26 DIAGNOSIS — H6191 Disorder of right external ear, unspecified: Secondary | ICD-10-CM | POA: Diagnosis not present

## 2022-08-26 DIAGNOSIS — R040 Epistaxis: Secondary | ICD-10-CM | POA: Diagnosis not present

## 2022-08-26 DIAGNOSIS — I780 Hereditary hemorrhagic telangiectasia: Secondary | ICD-10-CM | POA: Insufficient documentation

## 2022-08-26 DIAGNOSIS — K7460 Unspecified cirrhosis of liver: Secondary | ICD-10-CM | POA: Diagnosis not present

## 2022-08-26 DIAGNOSIS — I272 Pulmonary hypertension, unspecified: Secondary | ICD-10-CM | POA: Diagnosis not present

## 2022-08-26 DIAGNOSIS — Z992 Dependence on renal dialysis: Secondary | ICD-10-CM | POA: Diagnosis not present

## 2022-08-26 DIAGNOSIS — M349 Systemic sclerosis, unspecified: Secondary | ICD-10-CM | POA: Diagnosis not present

## 2022-08-26 DIAGNOSIS — N2581 Secondary hyperparathyroidism of renal origin: Secondary | ICD-10-CM | POA: Diagnosis not present

## 2022-08-28 DIAGNOSIS — N186 End stage renal disease: Secondary | ICD-10-CM | POA: Diagnosis not present

## 2022-08-28 DIAGNOSIS — N2581 Secondary hyperparathyroidism of renal origin: Secondary | ICD-10-CM | POA: Diagnosis not present

## 2022-08-28 DIAGNOSIS — Z992 Dependence on renal dialysis: Secondary | ICD-10-CM | POA: Diagnosis not present

## 2022-08-28 DIAGNOSIS — D631 Anemia in chronic kidney disease: Secondary | ICD-10-CM | POA: Diagnosis not present

## 2022-08-28 DIAGNOSIS — D509 Iron deficiency anemia, unspecified: Secondary | ICD-10-CM | POA: Diagnosis not present

## 2022-08-31 ENCOUNTER — Telehealth: Payer: Self-pay | Admitting: *Deleted

## 2022-08-31 ENCOUNTER — Other Ambulatory Visit: Payer: Self-pay | Admitting: *Deleted

## 2022-08-31 ENCOUNTER — Telehealth: Payer: Self-pay | Admitting: Hematology and Oncology

## 2022-08-31 DIAGNOSIS — D693 Immune thrombocytopenic purpura: Secondary | ICD-10-CM

## 2022-08-31 DIAGNOSIS — Z992 Dependence on renal dialysis: Secondary | ICD-10-CM | POA: Diagnosis not present

## 2022-08-31 DIAGNOSIS — D509 Iron deficiency anemia, unspecified: Secondary | ICD-10-CM | POA: Diagnosis not present

## 2022-08-31 DIAGNOSIS — D631 Anemia in chronic kidney disease: Secondary | ICD-10-CM | POA: Diagnosis not present

## 2022-08-31 DIAGNOSIS — N186 End stage renal disease: Secondary | ICD-10-CM | POA: Diagnosis not present

## 2022-08-31 DIAGNOSIS — N2581 Secondary hyperparathyroidism of renal origin: Secondary | ICD-10-CM | POA: Diagnosis not present

## 2022-08-31 NOTE — Telephone Encounter (Signed)
Patient called. Recent appt with ENT - she may have HHT due to to presence of tiny broken blood vessels in mouth seen on exam. She has been having a small amt of bright red bleeding when she coughs or sometimes when she blows her nose. Was recently started on winrevair for pulmonary hypertension - 2 injections every 3 weeks, has had 2 injections. She had dialysis today and Hgb 8. Her appt is Oct 4 and she wants to know if Dr. Leonides Schanz would see her sooner. Dr. Leonides Schanz informed. Per Dr. Leonides Schanz - lab appt tomorrow and will follow up based on results. HP schedule message sent to schedule patient for lab appt tomorrow.  Contacted patient with with this information and advised her to expect call from scheduling. She verbalized understanding.

## 2022-09-01 ENCOUNTER — Ambulatory Visit: Payer: Medicare Other | Admitting: Neurology

## 2022-09-01 ENCOUNTER — Inpatient Hospital Stay: Payer: Medicare Other | Attending: Hematology and Oncology

## 2022-09-01 ENCOUNTER — Encounter: Payer: Self-pay | Admitting: Neurology

## 2022-09-01 VITALS — BP 110/67 | HR 80 | Ht 65.0 in | Wt 126.0 lb

## 2022-09-01 DIAGNOSIS — M792 Neuralgia and neuritis, unspecified: Secondary | ICD-10-CM | POA: Diagnosis not present

## 2022-09-01 DIAGNOSIS — M791 Myalgia, unspecified site: Secondary | ICD-10-CM | POA: Diagnosis not present

## 2022-09-01 DIAGNOSIS — D693 Immune thrombocytopenic purpura: Secondary | ICD-10-CM | POA: Diagnosis not present

## 2022-09-01 LAB — CBC WITH DIFFERENTIAL (CANCER CENTER ONLY)
Abs Immature Granulocytes: 0.03 10*3/uL (ref 0.00–0.07)
Basophils Absolute: 0 10*3/uL (ref 0.0–0.1)
Basophils Relative: 1 %
Eosinophils Absolute: 0 10*3/uL (ref 0.0–0.5)
Eosinophils Relative: 1 %
HCT: 32.4 % — ABNORMAL LOW (ref 36.0–46.0)
Hemoglobin: 10.1 g/dL — ABNORMAL LOW (ref 12.0–15.0)
Immature Granulocytes: 1 %
Lymphocytes Relative: 24 %
Lymphs Abs: 0.7 10*3/uL (ref 0.7–4.0)
MCH: 28 pg (ref 26.0–34.0)
MCHC: 31.2 g/dL (ref 30.0–36.0)
MCV: 89.8 fL (ref 80.0–100.0)
Monocytes Absolute: 0.5 10*3/uL (ref 0.1–1.0)
Monocytes Relative: 17 %
Neutro Abs: 1.7 10*3/uL (ref 1.7–7.7)
Neutrophils Relative %: 56 %
Platelet Count: 200 10*3/uL (ref 150–400)
RBC: 3.61 MIL/uL — ABNORMAL LOW (ref 3.87–5.11)
RDW: 17.9 % — ABNORMAL HIGH (ref 11.5–15.5)
WBC Count: 3.1 10*3/uL — ABNORMAL LOW (ref 4.0–10.5)
nRBC: 2 % — ABNORMAL HIGH (ref 0.0–0.2)

## 2022-09-01 LAB — CMP (CANCER CENTER ONLY)
ALT: 20 U/L (ref 0–44)
AST: 28 U/L (ref 15–41)
Albumin: 3.1 g/dL — ABNORMAL LOW (ref 3.5–5.0)
Alkaline Phosphatase: 112 U/L (ref 38–126)
Anion gap: 14 (ref 5–15)
BUN: 46 mg/dL — ABNORMAL HIGH (ref 8–23)
CO2: 27 mmol/L (ref 22–32)
Calcium: 8.9 mg/dL (ref 8.9–10.3)
Chloride: 96 mmol/L — ABNORMAL LOW (ref 98–111)
Creatinine: 6.85 mg/dL — ABNORMAL HIGH (ref 0.44–1.00)
GFR, Estimated: 6 mL/min — ABNORMAL LOW (ref >60)
Glucose, Bld: 82 mg/dL (ref 70–99)
Potassium: 4.3 mmol/L (ref 3.5–5.1)
Sodium: 137 mmol/L (ref 135–145)
Total Bilirubin: 0.4 mg/dL (ref 0.3–1.2)
Total Protein: 7.3 g/dL (ref 6.5–8.1)

## 2022-09-01 LAB — SAMPLE TO BLOOD BANK

## 2022-09-01 NOTE — Progress Notes (Signed)
[facility] [address] [place]. [place] [address] [phone]       STROKE FOLLOW UP NOTE  Ms. [name] Date of Birth:  [birth year] Medical Record Number:  [id number]   Reason for Referral:  stroke follow up    SUBJECTIVE:   CHIEF COMPLAINT:  Chief Complaint  Patient presents with   Follow-up    RM 7, here alone Pt is here for neuropathy follow up. Pt states she continues with the tingling on her feet and toes.      HPI:  Update 09/01/2022 : She returns for follow-up after last visit 4 months ago.  She is accompanied by a female family member.  Patient states she continues to have significant neuropathic pain and paresthesias in her feet particularly at night.  She has been taking gabapentin 200 mg at night with use to initially help but is no longer helping as much.  The pain is more prominent when she is resting or sitting quietly with during the day it is not as noticeable.  Patient states she has been diagnosed by hematologist with hereditary hemorrhagic telangiectasia.  She has had a few minute nasal bleeds and when she coughs hard sometimes she has blood-tinged sputum.  She denies any recurrent stroke or TIA symptoms.  She remains on aspirin which is tolerating well without bruising or bleeding.  She is walks with a cane and a walker has had no recent falls.  She uses wheelchair for long distances.  Lab work at last visit on 04/28/2022 had shown low vitamin D levels of 12.2 with normal TSH and other neuropathic panel labs.  LDL cholesterol was optimal at 59 mg percent and hemoglobin A1c at 5.4.  She had a carotid ultrasound on 05/19/2022 which showed no significant extracranial stenosis.  She has no other new complaints. Update 04/28/2022 : She returns for follow-up after last visit nearly a year ago.  She is accompanied by her son Jomarie Longs.  Patient has been complaining of increasing bilateral leg pain and paresthesias from the calf down in both feet.  He also  has intermittent burning as well as cramps in the feet.  He has been taking gabapentin at last visit advised to take 100 mg in the morning and 200 at night however she has not been following the dysphonia 100 mg at night which seems to help.  She is scared to increase the dose because of fear of side effects.  She spoke to her primary care physician who ordered a nerve conduction study which was done on 03/10/2022 by Dr. Murray Hodgkins which commonly found sensorimotor peripheral neuropathy with absent bilateral sural responses.  Chronic neurogenic denervation  Update 06/10/2021 ; she returns for follow-up after last visit 6 months ago.  She is accompanied by husband.  Patient is doing well and had no recurrent stroke or TIA symptoms.  She states her blood pressure is in fact been quite low and today is 90/52.  She had cardiac ablation and since then both the heart rate and blood pressures have been quite low.  She remains on aspirin she is tolerating well without bruising or patient does not have diabetes has not been evaluated for causes of neuropathy.  Gait and balance difficulties but is able to ambulate.  She admits her balance is poor but fortunately she has had no falls or injuries.  She remains on aspirin for stroke prevention and has had no recurrent stroke or TIA symptoms.  She did see a cardiologist  recently had an echocardiogram done yesterday.  She states her blood pressure has been running quite low following her ablation.  He is tolerating Lipitor well without side effects last lipid profile on 05/3121 showed LDL to be 105 mg percent.  She she continues to have mild right-sided weakness and gait difficulties but does fairly well and uses a cane only for long distances.  She has had no falls or injuries.  She however complains of increased cramps in the legs particularly at night.  She does take gabapentin 100 twice daily but its not helping as much.  She has not tried higher doses yet.  She has Trental listed  as a medication for peripheral vascular disease but has not been taking it and is not sure she needs it.  Advised her to discuss this with her vascular surgeon.  She has not had lipid profile checked for quite some time. Update 12/02/2020 JM: Returns for 65-month stroke follow-up accompanied by her husband  Overall stable since prior visit -denies new stroke/TIA symptoms Residual right spastic hemiparesis - reports stable since prior visit. Some pain RLE distally. She questions possible restart of PT. Occasional use of Flexeril and gabapentin Continues to use a cane for ambulation -denies any recent falls Does report R>L foot numbness typically worse at night - this has been affecting her gait. She does have known scleroderma and questions if these symptoms could be part of that Currently working with PT on right sided neck/shoulder pain with some improvement - followed by orthopedics - considering use of cortisone injection per patient. Chronic issue but worsened post stroke  Compliant on aspirin - denies side effects Does not consistently take atorvastatin - she did not feel this was needed. Denies side effects.  Blood pressure today 98/68 -routinely followed by nephrology and cardiology for blood pressure management  Routinely follows with GI for hepatic cirrhosis - reports recently dx'd with esophageal and rectal varices - has f/u 11/30 to further discuss Routinely follows with [facility] cardiology She is also followed by [facility] neurology  No further concerns at this time     History provided for reference purposes only Update 08/04/2020 Dr. Pearlean Brownie She returns for follow-up after last visit with Shanda Bumps our nurse practitioner 5 months ago.  She is doing well without recurrent stroke or TIA symptoms.  She continues to have right-sided weakness with is able to walk with a cane and has finished outpatient physical and Occupational Therapy.  She still has mild right foot drop.  She has had no falls or  injuries.  She has not started taking aspirin or Eliquis and has questions about her treatment options.  She had a follow-up CT scan of the brain done on 03/25/2020 which showed satisfactory resolution of the left frontal parenchymal hematoma without any acute abnormalities.  She continues to have low blood pressure with the systolic in the 80s ever since her diagnosis and treatment for pulmonary hypertension.  Today it is 80/57 but she denies having significant symptoms of orthostasis, tiredness and fatigue.  Initial visit 02/20/2020 JM: Ms. Thorpe is being seen for hospital follow-up accompanied by her husband. She was discharged home from CIR on 01/23/2020. She has been doing well since discharge without new stroke/TIA symptoms and reports residual right-sided weakness and gait impairment with continued improvement.  Currently working with neuro rehab PT/OT.  She does report increased tone in RUE muscle tightness which is typically worse at night.  She was using Flexeril during CIR with benefit and  questions if this could be restarted.  Ambulates with cane and denies any recent falls.  She is not using assistive device PTA but was using rolling walker initially at discharge.  She remains on atorvastatin 40 mg daily without myalgias.  Blood pressure today 133/72.  BP routinely monitored at dialysis.  No further concerns at this time.  Stroke admission 12/12/2019 Ms. CURLEE KUECK is a 67 y.o. female with history of ESRD on HD TTS, fibromyalgia, hypertension, mixed connective tissue disorder, scleroderma, pulmonaryhypertension, atrial fibrillation, cirrhosis-on Coumadin for portal vein thrombosis and atrial fibrillation who presented to [facility] ED on 12/12/2019 with R sided weakness. Personally reviewed hospitalization pertinent progress notes, lab work and imaging with summary provided. Evaluated by Dr. Roda Shutters with stroke work-up revealing left frontal IPH likely secondary to warfarin coagulopathy. Warfarin was reversed  with vitamin K and Kcentra with INR of 1.7 on admission and recommended follow-up outpatient to consider resuming warfarin or consider Eliquis once ICH resolves. History of HTN stable. LDL 79 and recommend consideration of statin at discharge for follow-up in setting of hemorrhage. No history or evidence of DM with A1c 5.2. Other stroke risk factors include ESRD on HD and OSA. Other active problems include anemia of ESRD, metabolic bone disease, pulmonary hypertension, RA, scleroderma and fibromyalgia. Residual deficit of right hemiplegia. Evaluated by therapies and discharged to CIR on 12/18/2019 for ongoing therapy needs.  Stroke:   L frontal IPH d/t warfarin coagulopathy  CT head acute L frontal IPH w/ mild edema Repeat CT head stable L frontal IPH Repeat CT head unchanged L frontal IPH MRI  Subcortical L frontal white matter IPH w/ rim edema. few remote B hemorrhages upper cerebrum. MRA  Unremarkable  2D Echo EF 65 to 70% LDL 79 HgbA1c 5.2 VTE prophylaxis - SCDs  warfarin daily prior to admission, now on No antithrombotic given IPH Therapy recommendations:  CIR Disposition:  CIR       ROS:   14 system review of systems performed and negative with exception of those listed in HPI  PMH:  Past Medical History:  Diagnosis Date   Achalasia    Anxiety    Dysplasia of cervix, low grade (CIN 1)    Environmental allergies    "all year long" (12/27/2016)   ESRD (end stage renal disease) on dialysis (HCC)    "TTS; [facility]" (12/27/2016)   Fibromyalgia    GERD (gastroesophageal reflux disease)    Gout    Hypertension    IBS (irritable bowel syndrome)    MVP (mitral valve prolapse)    RA (rheumatoid arthritis) (HCC)    FOLLOWED BY DR. SHANAHAN   Raynaud's disease    Scleroderma (HCC)    Seasonal allergies    Thrombocytopenia (HCC) 07/01/2016   Acute fall to 13,000 07/01/16   Tubular adenoma 01/08/2008   CECUM   Vitamin D deficiency     PSH:  Past Surgical History:   Procedure Laterality Date   ANKLE FRACTURE SURGERY Right    AV FISTULA PLACEMENT Left 06/28/2016   Procedure: left arm ARTERIOVENOUS (AV) FISTULA CREATION;  Surgeon: Larina Earthly, MD;  Location: [facility] OR;  Service: Vascular;  Laterality: Left;   BASCILIC VEIN TRANSPOSITION Left 09/27/2016   Procedure: LEFT UPPER ARM CEPHALIC VEIN TRANSPOSITION;  Surgeon: Larina Earthly, MD;  Location: [facility] OR;  Service: Vascular;  Laterality: Left;   BREAST BIOPSY     "? side"   CESAREAN SECTION  1994   CO2 LASER OF CERVIX  COLONOSCOPY W/ BIOPSIES  01/08/2008   INSERTION OF DIALYSIS CATHETER Right 06/28/2016   Procedure: INSERTION OF DIALYSIS CATHETER, right internal jugular;  Surgeon: Larina Earthly, MD;  Location: [facility] OR;  Service: Vascular;  Laterality: Right;   MYOMECTOMY     NASAL ENDOSCOPY WITH EPISTAXIS CONTROL N/A 12/29/2019   Procedure: NASAL ENDOSCOPY WITH EPISTAXIS CONTROL;  Surgeon: Newman Pies, MD;  Location: [facility] OR;  Service: ENT;  Laterality: N/A;   PELVIC LAPAROSCOPY  2011   superficial thrombophlebitis Left 07-2014    Social History:  Social History   Socioeconomic History   Marital status: Married    Spouse name: Not on file   Number of children: Not on file   Years of education: Not on file   Highest education level: Not on file  Occupational History   Not on file  Tobacco Use   Smoking status: Never   Smokeless tobacco: Never  Vaping Use   Vaping status: Never Used  Substance and Sexual Activity   Alcohol use: No   Drug use: No   Sexual activity: Not Currently    Birth control/protection: Post-menopausal  Other Topics Concern   Not on file  Social History Narrative   Lives in [place]   Social Determinants of Health   Financial Resource Strain: Not on file  Food Insecurity: No Food Insecurity (06/24/2022)   Hunger Vital Sign    Worried About Running Out of Food in the Last Year: Never true    Ran Out of Food in the Last Year: Never true  Transportation Needs: No  Transportation Needs (06/24/2022)   PRAPARE - Administrator, Civil Service (Medical): No    Lack of Transportation (Non-Medical): No  Physical Activity: Not on file  Stress: Not on file  Social Connections: Unknown (05/22/2021)   [facility] from [facility]   Social Network    Social Network: Not on file  Intimate Partner Violence: Not At Risk (06/24/2022)   Humiliation, Afraid, Rape, and Kick questionnaire    Fear of Current or Ex-Partner: No    Emotionally Abused: No    Physically Abused: No    Sexually Abused: No    Family History:  Family History  Problem Relation Age of Onset   Hypertension Mother    Diabetes Mother    Heart disease Father    Hypertension Maternal Aunt    Diabetes Maternal Grandmother    Heart disease Paternal Grandfather    Cerebral palsy Cousin        1ST COUSIN?   Diabetes Paternal Grandmother     Medications:   Current Outpatient Medications on File Prior to Visit  Medication Sig Dispense Refill   albuterol (VENTOLIN HFA) 108 (90 Base) MCG/ACT inhaler Inhale 2 puffs into the lungs every 6 (six) hours as needed for wheezing or shortness of breath. 8 g 6   ambrisentan (LETAIRIS) 5 MG tablet Take 10 mg by mouth daily.     aspirin EC 81 MG tablet Take 1 tablet (81 mg total) by mouth daily. Swallow whole. 30 tablet 11   atorvastatin (LIPITOR) 80 MG tablet Take 1 tablet (80 mg total) by mouth daily. 90 tablet 3   azelastine (ASTELIN) 0.1 % nasal spray Place 1 spray into both nostrils 2 (two) times daily as needed for rhinitis. Use in each nostril as directed 30 mL 12   benzonatate (TESSALON) 100 MG capsule Take 1 capsule (100 mg total) by mouth every 8 (eight) hours. 21 capsule  0   bisacodyl (DULCOLAX) 5 MG EC tablet Take 1 tablet (5 mg total) by mouth daily as needed for severe constipation. 10 tablet 0   calcium acetate (PHOSLO) 667 MG capsule Take by mouth.     camphor-menthol (SARNA) lotion Apply topically 2 (two) times daily.  222 mL 0   clonazePAM (KLONOPIN) 0.5 MG tablet Take 1 tablet (0.5 mg total) by mouth 2 (two) times daily as needed for anxiety. 30 tablet 0   co-enzyme Q-10 30 MG capsule Take 7 capsules (210 mg total) by mouth 3 (three) times daily. 1 capsule 1   cyclobenzaprine (FLEXERIL) 5 MG tablet Take 1 tablet (5 mg total) by mouth every 8 (eight) hours as needed for muscle spasms. 30 tablet 0   docusate sodium (COLACE) 100 MG capsule Take 2 capsules (200 mg total) by mouth daily. 60 capsule 0   famotidine (PEPCID) 20 MG tablet Take 1 tablet (20 mg total) by mouth daily as needed for heartburn or indigestion. (Patient taking differently: Take 40 mg by mouth 2 (two) times daily.) 30 tablet 0   folic acid (FOLVITE) 1 MG tablet Take 1 tablet (1 mg total) by mouth daily. 90 tablet 2   gabapentin (NEURONTIN) 100 MG capsule Take 1 capsule (100 mg total) by mouth 3 (three) times daily. Take 1 capsule in morning and two at night 90 capsule 0   hydrocerin (EUCERIN) CREA Apply 1 application topically 2 (two) times daily. To dry skin--avoid IV site 454 g 0   hydrocortisone (ANUSOL-HC) 2.5 % rectal cream Place 1 Application rectally 2 (two) times daily. 30 g 0   hydrOXYzine (ATARAX/VISTARIL) 25 MG tablet Take 25 mg by mouth every 8 (eight) hours as needed for itching.      levocetirizine (XYZAL) 5 MG tablet SMARTSIG:1 Tablet(s) By Mouth Every Evening     lidocaine-prilocaine (EMLA) cream Apply 1 application topically as needed. Apply to toes as needed for pain 30 g 3   lubiprostone (AMITIZA) 8 MCG capsule Take 8 mcg by mouth 2 (two) times daily with a meal.     multivitamin (RENA-VIT) TABS tablet Take 1 tablet by mouth daily at 6 (six) AM. 30 tablet 0   oxymetazoline (AFRIN) 0.05 % nasal spray Place 1 spray into both nostrils 2 (two) times daily as needed for congestion. 30 mL 0   polyethylene glycol (MIRALAX / GLYCOLAX) 17 g packet Take 17 g by mouth 2 (two) times daily. 14 each 0   Selexipag (UPTRAVI) 800 MCG TABS Take  1 tablet (800 mcg total) by mouth in the morning and at bedtime. 60 tablet 0   sodium chloride (OCEAN) 0.65 % SOLN nasal spray Place 1 spray into both nostrils 5 (five) times daily. At least 5 times a day  0   sodium chloride (OCEAN) 0.65 % SOLN nasal spray Place 1 spray into both nostrils as needed for congestion. 44 mL 0   vitamin B-12 (CYANOCOBALAMIN) 100 MCG tablet Take 1 tablet (100 mcg total) by mouth daily. 90 tablet 1   WINREVAIR subcutaneous injection Inject into the skin every 21 ( twenty-one) days.     fluticasone (FLONASE) 50 MCG/ACT nasal spray Place 1 spray into both nostrils daily for 7 days. 15.8 mL 0   nitroGLYCERIN (NITRO-DUR) 0.2 mg/hr patch Place 1 patch (0.2 mg total) onto the skin daily. Apply near great toes bilateral to top of foot every morning.  May remove at night (Patient not taking: Reported on 09/01/2022) 30 patch 4  No current facility-administered medications on file prior to visit.    Allergies:   Allergies  Allergen Reactions   Other Anaphylaxis and Other (See Comments)    Do not use polyflux membrane.  Use alternate Other reaction(s): heart racing   Savella [Milnacipran Hcl] Palpitations and Other (See Comments)    Unknown   Tape Rash and Other (See Comments)    Itch- unsure if it was paper or adhesive tape      OBJECTIVE:  Physical Exam  Vitals:   09/01/22 1534  BP: 110/67  Pulse: 80  Weight: 126 lb (57.2 kg)  Height: 5\' 5"  (1.651 m)   Body mass index is 20.97 kg/m. No results found.  General: Frail very pleasant middle-aged African-American female, seated, in no evident distress Head: head normocephalic and atraumatic.   Neck: supple with no carotid or supraclavicular bruits Cardiovascular: regular rate and rhythm, soft ejection systolic murmur throughout precordium Musculoskeletal: no deformity Skin:  no rash/petichiae; +bruit and +thrill LUE fistula Vascular:  Normal pulses all extremities   Neurologic Exam Mental Status: Awake  and fully alert.  Fluent speech and language. Oriented to place and time. Recent and remote memory intact. Attention span, concentration and fund of knowledge appropriate. Mood and affect appropriate.  Cranial Nerves: Pupils equal, briskly reactive to light. Extraocular movements full without nystagmus. Visual fields full to confrontation. Hearing intact. Facial sensation intact. Face, tongue, palate moves normally and symmetrically.  Motor: Normal bulk and tone and strength LUE and LLE.  RUE: 4/5 with slightly decreased hand dexterity and increased tone; RLE: 4/5 hip flexor and ankle dorsiflexion and mildly increased tone distally Sensory.: hyperesthesia touch , pinprick in both legs from calf down.  Preserved, position and vibratory sensation.  Coordination: Rapid alternating movements normal in all extremities except right hand. Finger-to-nose and heel-to-shin performed accurately bilaterally Gait and Station: Arises from chair without difficulty. Stance is normal. Gait is slightly broad-based and demonstrates mild spastic hemiplegic gait with mild circumduction foot drop on the right.  With use of cane.  Tandem walk and heel toe not attempted Reflexes: 2+ RUE and RLE; 1+ LUE and LLE. Toes downgoing.         ASSESSMENT: WHITTNI NARD is a 67 y.o. year old female presented with right-sided weakness on 12/12/2019 with stroke work-up revealing left frontal IPH secondary to warfarin coagulopathy. Vascular risk factors include HTN, HLD, atrial fibrillation s/p ablation, portal vein thrombosis, pulmonary hypertension, rheumatoid arthritis, chronic right-sided heart failure, scleroderma and ESRD on HD.  Patient is doing well but continues to have mild spastic right hemiparesis . Recent increase leg cramps, pain and paresthesia from newly diagnosed peripheral neuropathy of undetermined etiology       PLAN: I had a long discussion with the patient  regarding her remote intraparenchymal hemorrhage and  mild residual right hemiparesis and leg cramps, pain and paresthesias, new diagnosis of peripheral neuropathy and answered questions.  I recommend she continue co-Q10 200 mg daily to help with statin myalgias and cramps as well as increase the dose of gabapentin 100 mg in the morning and 300 milligrams at night.     She will stay on aspirin for stroke prevention and maintain aggressive risk factor modification strict control of hypertension blood pressure goal below 130/90, lipids with LDL cholesterol goal below 70 mg percent and diabetes with hemoglobin A1c goal below 6.5%.   Marland Kitchen  She will return for follow-up in the future in a year or call earlier if necessary.  Greater  than 50% time during this 35-minute visit was spent on counseling and coordination of care about her cramps and post hemorrhage spasticity and answering questions.  Delia Heady, MD [facility] [address] [place], [place] [address]  Phone [phone] Fax [phone] Note: This document was prepared with digital dictation and possible smart phrase technology. Any transcriptional errors that result from this process are unintentional.

## 2022-09-01 NOTE — Patient Instructions (Signed)
I had a long discussion with the patient  regarding her remote intraparenchymal hemorrhage and mild residual right hemiparesis and leg cramps, pain and paresthesias, new diagnosis of peripheral neuropathy and answered questions.  I recommend she continue co-Q10 200 mg daily to help with statin myalgias and cramps as well as increase the dose of gabapentin 100 mg in the morning and 300 milligrams at night.     She will stay on aspirin for stroke prevention and maintain aggressive risk factor modification strict control of hypertension blood pressure goal below 130/90, lipids with LDL cholesterol goal below 70 mg percent and diabetes with hemoglobin A1c goal below 6.5%.  She was advised to follow up with her primary MD for treatment of low vitamin D levels and scleroderma. .  She will return for follow-up in the future in a year or call earlier if necessary.

## 2022-09-02 DIAGNOSIS — N2581 Secondary hyperparathyroidism of renal origin: Secondary | ICD-10-CM | POA: Diagnosis not present

## 2022-09-02 DIAGNOSIS — Z992 Dependence on renal dialysis: Secondary | ICD-10-CM | POA: Diagnosis not present

## 2022-09-02 DIAGNOSIS — D509 Iron deficiency anemia, unspecified: Secondary | ICD-10-CM | POA: Diagnosis not present

## 2022-09-02 DIAGNOSIS — N186 End stage renal disease: Secondary | ICD-10-CM | POA: Diagnosis not present

## 2022-09-02 DIAGNOSIS — D631 Anemia in chronic kidney disease: Secondary | ICD-10-CM | POA: Diagnosis not present

## 2022-09-03 ENCOUNTER — Telehealth: Payer: Self-pay | Admitting: *Deleted

## 2022-09-03 NOTE — Telephone Encounter (Signed)
Received vm message from pt inquiring about lab results from 09/01/22   Please advise

## 2022-09-04 DIAGNOSIS — D631 Anemia in chronic kidney disease: Secondary | ICD-10-CM | POA: Diagnosis not present

## 2022-09-04 DIAGNOSIS — N186 End stage renal disease: Secondary | ICD-10-CM | POA: Diagnosis not present

## 2022-09-04 DIAGNOSIS — N2581 Secondary hyperparathyroidism of renal origin: Secondary | ICD-10-CM | POA: Diagnosis not present

## 2022-09-04 DIAGNOSIS — D509 Iron deficiency anemia, unspecified: Secondary | ICD-10-CM | POA: Diagnosis not present

## 2022-09-04 DIAGNOSIS — Z992 Dependence on renal dialysis: Secondary | ICD-10-CM | POA: Diagnosis not present

## 2022-09-06 ENCOUNTER — Telehealth: Payer: Self-pay | Admitting: *Deleted

## 2022-09-06 NOTE — Telephone Encounter (Signed)
Received call from pt regarding labs that were done on 09/01/22. She is asking if the results have anything to do with HHT ((hereditary hemorrhagic telangiectasia )  Pt states she takes a medication called Winrevair every 21 days. She wants to know if she is ok to take that. She sees Dr. Ernestene Kiel in ENT About this.  Please advise.  Review her labs results with her so she is aware that her HGB and platelets have improved since the last time they were checked.

## 2022-09-07 DIAGNOSIS — N186 End stage renal disease: Secondary | ICD-10-CM | POA: Diagnosis not present

## 2022-09-07 DIAGNOSIS — D509 Iron deficiency anemia, unspecified: Secondary | ICD-10-CM | POA: Diagnosis not present

## 2022-09-07 DIAGNOSIS — N2581 Secondary hyperparathyroidism of renal origin: Secondary | ICD-10-CM | POA: Diagnosis not present

## 2022-09-07 DIAGNOSIS — Z992 Dependence on renal dialysis: Secondary | ICD-10-CM | POA: Diagnosis not present

## 2022-09-07 DIAGNOSIS — D631 Anemia in chronic kidney disease: Secondary | ICD-10-CM | POA: Diagnosis not present

## 2022-09-09 DIAGNOSIS — N2581 Secondary hyperparathyroidism of renal origin: Secondary | ICD-10-CM | POA: Diagnosis not present

## 2022-09-09 DIAGNOSIS — Z992 Dependence on renal dialysis: Secondary | ICD-10-CM | POA: Diagnosis not present

## 2022-09-09 DIAGNOSIS — N186 End stage renal disease: Secondary | ICD-10-CM | POA: Diagnosis not present

## 2022-09-09 DIAGNOSIS — D509 Iron deficiency anemia, unspecified: Secondary | ICD-10-CM | POA: Diagnosis not present

## 2022-09-09 DIAGNOSIS — D631 Anemia in chronic kidney disease: Secondary | ICD-10-CM | POA: Diagnosis not present

## 2022-09-11 DIAGNOSIS — D631 Anemia in chronic kidney disease: Secondary | ICD-10-CM | POA: Diagnosis not present

## 2022-09-11 DIAGNOSIS — Z992 Dependence on renal dialysis: Secondary | ICD-10-CM | POA: Diagnosis not present

## 2022-09-11 DIAGNOSIS — N269 Renal sclerosis, unspecified: Secondary | ICD-10-CM | POA: Diagnosis not present

## 2022-09-11 DIAGNOSIS — N2581 Secondary hyperparathyroidism of renal origin: Secondary | ICD-10-CM | POA: Diagnosis not present

## 2022-09-11 DIAGNOSIS — N186 End stage renal disease: Secondary | ICD-10-CM | POA: Diagnosis not present

## 2022-09-11 DIAGNOSIS — D509 Iron deficiency anemia, unspecified: Secondary | ICD-10-CM | POA: Diagnosis not present

## 2022-09-14 DIAGNOSIS — D631 Anemia in chronic kidney disease: Secondary | ICD-10-CM | POA: Diagnosis not present

## 2022-09-14 DIAGNOSIS — N186 End stage renal disease: Secondary | ICD-10-CM | POA: Diagnosis not present

## 2022-09-14 DIAGNOSIS — D509 Iron deficiency anemia, unspecified: Secondary | ICD-10-CM | POA: Diagnosis not present

## 2022-09-14 DIAGNOSIS — Z992 Dependence on renal dialysis: Secondary | ICD-10-CM | POA: Diagnosis not present

## 2022-09-14 DIAGNOSIS — N2581 Secondary hyperparathyroidism of renal origin: Secondary | ICD-10-CM | POA: Diagnosis not present

## 2022-09-14 DIAGNOSIS — R519 Headache, unspecified: Secondary | ICD-10-CM | POA: Diagnosis not present

## 2022-09-15 ENCOUNTER — Telehealth: Payer: Self-pay | Admitting: *Deleted

## 2022-09-15 NOTE — Telephone Encounter (Signed)
.   She is asking how that diagnosis relates to her hematological concerns. Advised that we will simply monitor her CBC and iron levels as it relates to the HHT.  If she develops any significant bleeding with HHT, we would be able to give her iron if needed or transfuse. Also advised that her Nephrologist monitors these as well as she is on dialysis. Asked pt if she gets any injections @ dialysis such as epogen. Pt said she doesn't think so. She does follow with ENT for her HHT. She is on medication for her pulmonary hypertension as well. Reiforced that our role in her health care is monitoring her blood counts, platelets for her ITP and her iron levels. Reminded pt that she has appts here on 10/15/22. Pt voiced understanding to the above. Pt states she feels comfortable about things after our discussion today.

## 2022-09-15 NOTE — Telephone Encounter (Signed)
TCT patient regarding her diagnosis of HHT

## 2022-09-16 DIAGNOSIS — Z992 Dependence on renal dialysis: Secondary | ICD-10-CM | POA: Diagnosis not present

## 2022-09-16 DIAGNOSIS — N186 End stage renal disease: Secondary | ICD-10-CM | POA: Diagnosis not present

## 2022-09-16 DIAGNOSIS — N2581 Secondary hyperparathyroidism of renal origin: Secondary | ICD-10-CM | POA: Diagnosis not present

## 2022-09-16 DIAGNOSIS — D631 Anemia in chronic kidney disease: Secondary | ICD-10-CM | POA: Diagnosis not present

## 2022-09-16 DIAGNOSIS — D509 Iron deficiency anemia, unspecified: Secondary | ICD-10-CM | POA: Diagnosis not present

## 2022-09-16 DIAGNOSIS — R519 Headache, unspecified: Secondary | ICD-10-CM | POA: Diagnosis not present

## 2022-09-18 DIAGNOSIS — D631 Anemia in chronic kidney disease: Secondary | ICD-10-CM | POA: Diagnosis not present

## 2022-09-18 DIAGNOSIS — D509 Iron deficiency anemia, unspecified: Secondary | ICD-10-CM | POA: Diagnosis not present

## 2022-09-18 DIAGNOSIS — N186 End stage renal disease: Secondary | ICD-10-CM | POA: Diagnosis not present

## 2022-09-18 DIAGNOSIS — Z992 Dependence on renal dialysis: Secondary | ICD-10-CM | POA: Diagnosis not present

## 2022-09-18 DIAGNOSIS — N2581 Secondary hyperparathyroidism of renal origin: Secondary | ICD-10-CM | POA: Diagnosis not present

## 2022-09-18 DIAGNOSIS — R519 Headache, unspecified: Secondary | ICD-10-CM | POA: Diagnosis not present

## 2022-09-20 DIAGNOSIS — I270 Primary pulmonary hypertension: Secondary | ICD-10-CM | POA: Diagnosis not present

## 2022-09-20 DIAGNOSIS — N186 End stage renal disease: Secondary | ICD-10-CM | POA: Diagnosis not present

## 2022-09-20 DIAGNOSIS — R0609 Other forms of dyspnea: Secondary | ICD-10-CM | POA: Diagnosis not present

## 2022-09-21 DIAGNOSIS — D509 Iron deficiency anemia, unspecified: Secondary | ICD-10-CM | POA: Diagnosis not present

## 2022-09-21 DIAGNOSIS — D631 Anemia in chronic kidney disease: Secondary | ICD-10-CM | POA: Diagnosis not present

## 2022-09-21 DIAGNOSIS — R519 Headache, unspecified: Secondary | ICD-10-CM | POA: Diagnosis not present

## 2022-09-21 DIAGNOSIS — N2581 Secondary hyperparathyroidism of renal origin: Secondary | ICD-10-CM | POA: Diagnosis not present

## 2022-09-21 DIAGNOSIS — N186 End stage renal disease: Secondary | ICD-10-CM | POA: Diagnosis not present

## 2022-09-21 DIAGNOSIS — Z992 Dependence on renal dialysis: Secondary | ICD-10-CM | POA: Diagnosis not present

## 2022-09-23 DIAGNOSIS — Z992 Dependence on renal dialysis: Secondary | ICD-10-CM | POA: Diagnosis not present

## 2022-09-23 DIAGNOSIS — D631 Anemia in chronic kidney disease: Secondary | ICD-10-CM | POA: Diagnosis not present

## 2022-09-23 DIAGNOSIS — N186 End stage renal disease: Secondary | ICD-10-CM | POA: Diagnosis not present

## 2022-09-23 DIAGNOSIS — D509 Iron deficiency anemia, unspecified: Secondary | ICD-10-CM | POA: Diagnosis not present

## 2022-09-23 DIAGNOSIS — N2581 Secondary hyperparathyroidism of renal origin: Secondary | ICD-10-CM | POA: Diagnosis not present

## 2022-09-23 DIAGNOSIS — R519 Headache, unspecified: Secondary | ICD-10-CM | POA: Diagnosis not present

## 2022-09-25 DIAGNOSIS — N186 End stage renal disease: Secondary | ICD-10-CM | POA: Diagnosis not present

## 2022-09-25 DIAGNOSIS — Z992 Dependence on renal dialysis: Secondary | ICD-10-CM | POA: Diagnosis not present

## 2022-09-25 DIAGNOSIS — D509 Iron deficiency anemia, unspecified: Secondary | ICD-10-CM | POA: Diagnosis not present

## 2022-09-25 DIAGNOSIS — D631 Anemia in chronic kidney disease: Secondary | ICD-10-CM | POA: Diagnosis not present

## 2022-09-25 DIAGNOSIS — N2581 Secondary hyperparathyroidism of renal origin: Secondary | ICD-10-CM | POA: Diagnosis not present

## 2022-09-25 DIAGNOSIS — R519 Headache, unspecified: Secondary | ICD-10-CM | POA: Diagnosis not present

## 2022-09-28 DIAGNOSIS — Z992 Dependence on renal dialysis: Secondary | ICD-10-CM | POA: Diagnosis not present

## 2022-09-28 DIAGNOSIS — N186 End stage renal disease: Secondary | ICD-10-CM | POA: Diagnosis not present

## 2022-09-28 DIAGNOSIS — N2581 Secondary hyperparathyroidism of renal origin: Secondary | ICD-10-CM | POA: Diagnosis not present

## 2022-09-28 DIAGNOSIS — D509 Iron deficiency anemia, unspecified: Secondary | ICD-10-CM | POA: Diagnosis not present

## 2022-09-28 DIAGNOSIS — R519 Headache, unspecified: Secondary | ICD-10-CM | POA: Diagnosis not present

## 2022-09-28 DIAGNOSIS — D631 Anemia in chronic kidney disease: Secondary | ICD-10-CM | POA: Diagnosis not present

## 2022-09-30 DIAGNOSIS — D509 Iron deficiency anemia, unspecified: Secondary | ICD-10-CM | POA: Diagnosis not present

## 2022-09-30 DIAGNOSIS — D631 Anemia in chronic kidney disease: Secondary | ICD-10-CM | POA: Diagnosis not present

## 2022-09-30 DIAGNOSIS — N2581 Secondary hyperparathyroidism of renal origin: Secondary | ICD-10-CM | POA: Diagnosis not present

## 2022-09-30 DIAGNOSIS — N186 End stage renal disease: Secondary | ICD-10-CM | POA: Diagnosis not present

## 2022-09-30 DIAGNOSIS — Z992 Dependence on renal dialysis: Secondary | ICD-10-CM | POA: Diagnosis not present

## 2022-09-30 DIAGNOSIS — R519 Headache, unspecified: Secondary | ICD-10-CM | POA: Diagnosis not present

## 2022-10-01 DIAGNOSIS — D0421 Carcinoma in situ of skin of right ear and external auricular canal: Secondary | ICD-10-CM | POA: Diagnosis not present

## 2022-10-01 DIAGNOSIS — H938X1 Other specified disorders of right ear: Secondary | ICD-10-CM | POA: Diagnosis not present

## 2022-10-01 DIAGNOSIS — H6191 Disorder of right external ear, unspecified: Secondary | ICD-10-CM | POA: Diagnosis not present

## 2022-10-02 DIAGNOSIS — D631 Anemia in chronic kidney disease: Secondary | ICD-10-CM | POA: Diagnosis not present

## 2022-10-02 DIAGNOSIS — N2581 Secondary hyperparathyroidism of renal origin: Secondary | ICD-10-CM | POA: Diagnosis not present

## 2022-10-02 DIAGNOSIS — Z992 Dependence on renal dialysis: Secondary | ICD-10-CM | POA: Diagnosis not present

## 2022-10-02 DIAGNOSIS — N186 End stage renal disease: Secondary | ICD-10-CM | POA: Diagnosis not present

## 2022-10-02 DIAGNOSIS — D509 Iron deficiency anemia, unspecified: Secondary | ICD-10-CM | POA: Diagnosis not present

## 2022-10-02 DIAGNOSIS — R519 Headache, unspecified: Secondary | ICD-10-CM | POA: Diagnosis not present

## 2022-10-05 DIAGNOSIS — N186 End stage renal disease: Secondary | ICD-10-CM | POA: Diagnosis not present

## 2022-10-05 DIAGNOSIS — D509 Iron deficiency anemia, unspecified: Secondary | ICD-10-CM | POA: Diagnosis not present

## 2022-10-05 DIAGNOSIS — R519 Headache, unspecified: Secondary | ICD-10-CM | POA: Diagnosis not present

## 2022-10-05 DIAGNOSIS — Z992 Dependence on renal dialysis: Secondary | ICD-10-CM | POA: Diagnosis not present

## 2022-10-05 DIAGNOSIS — D631 Anemia in chronic kidney disease: Secondary | ICD-10-CM | POA: Diagnosis not present

## 2022-10-05 DIAGNOSIS — N2581 Secondary hyperparathyroidism of renal origin: Secondary | ICD-10-CM | POA: Diagnosis not present

## 2022-10-06 DIAGNOSIS — C44222 Squamous cell carcinoma of skin of right ear and external auricular canal: Secondary | ICD-10-CM | POA: Diagnosis not present

## 2022-10-06 DIAGNOSIS — R221 Localized swelling, mass and lump, neck: Secondary | ICD-10-CM | POA: Diagnosis not present

## 2022-10-07 DIAGNOSIS — Z992 Dependence on renal dialysis: Secondary | ICD-10-CM | POA: Diagnosis not present

## 2022-10-07 DIAGNOSIS — D509 Iron deficiency anemia, unspecified: Secondary | ICD-10-CM | POA: Diagnosis not present

## 2022-10-07 DIAGNOSIS — N186 End stage renal disease: Secondary | ICD-10-CM | POA: Diagnosis not present

## 2022-10-07 DIAGNOSIS — R519 Headache, unspecified: Secondary | ICD-10-CM | POA: Diagnosis not present

## 2022-10-07 DIAGNOSIS — N2581 Secondary hyperparathyroidism of renal origin: Secondary | ICD-10-CM | POA: Diagnosis not present

## 2022-10-07 DIAGNOSIS — D631 Anemia in chronic kidney disease: Secondary | ICD-10-CM | POA: Diagnosis not present

## 2022-10-09 DIAGNOSIS — R519 Headache, unspecified: Secondary | ICD-10-CM | POA: Diagnosis not present

## 2022-10-09 DIAGNOSIS — N2581 Secondary hyperparathyroidism of renal origin: Secondary | ICD-10-CM | POA: Diagnosis not present

## 2022-10-09 DIAGNOSIS — N186 End stage renal disease: Secondary | ICD-10-CM | POA: Diagnosis not present

## 2022-10-09 DIAGNOSIS — D631 Anemia in chronic kidney disease: Secondary | ICD-10-CM | POA: Diagnosis not present

## 2022-10-09 DIAGNOSIS — Z992 Dependence on renal dialysis: Secondary | ICD-10-CM | POA: Diagnosis not present

## 2022-10-09 DIAGNOSIS — D509 Iron deficiency anemia, unspecified: Secondary | ICD-10-CM | POA: Diagnosis not present

## 2022-10-11 ENCOUNTER — Other Ambulatory Visit: Payer: Self-pay | Admitting: Physician Assistant

## 2022-10-11 ENCOUNTER — Encounter: Payer: Self-pay | Admitting: *Deleted

## 2022-10-11 DIAGNOSIS — Z992 Dependence on renal dialysis: Secondary | ICD-10-CM | POA: Diagnosis not present

## 2022-10-11 DIAGNOSIS — N186 End stage renal disease: Secondary | ICD-10-CM | POA: Diagnosis not present

## 2022-10-11 DIAGNOSIS — N269 Renal sclerosis, unspecified: Secondary | ICD-10-CM | POA: Diagnosis not present

## 2022-10-12 DIAGNOSIS — C44221 Squamous cell carcinoma of skin of unspecified ear and external auricular canal: Secondary | ICD-10-CM | POA: Diagnosis not present

## 2022-10-12 DIAGNOSIS — N2581 Secondary hyperparathyroidism of renal origin: Secondary | ICD-10-CM | POA: Diagnosis not present

## 2022-10-12 DIAGNOSIS — D509 Iron deficiency anemia, unspecified: Secondary | ICD-10-CM | POA: Diagnosis not present

## 2022-10-12 DIAGNOSIS — D631 Anemia in chronic kidney disease: Secondary | ICD-10-CM | POA: Diagnosis not present

## 2022-10-12 DIAGNOSIS — Z992 Dependence on renal dialysis: Secondary | ICD-10-CM | POA: Diagnosis not present

## 2022-10-12 DIAGNOSIS — N186 End stage renal disease: Secondary | ICD-10-CM | POA: Diagnosis not present

## 2022-10-13 ENCOUNTER — Encounter: Payer: Self-pay | Admitting: Podiatry

## 2022-10-13 ENCOUNTER — Ambulatory Visit (INDEPENDENT_AMBULATORY_CARE_PROVIDER_SITE_OTHER): Payer: Medicare Other | Admitting: Podiatry

## 2022-10-13 DIAGNOSIS — M79674 Pain in right toe(s): Secondary | ICD-10-CM

## 2022-10-13 DIAGNOSIS — B351 Tinea unguium: Secondary | ICD-10-CM

## 2022-10-13 DIAGNOSIS — M79675 Pain in left toe(s): Secondary | ICD-10-CM

## 2022-10-13 NOTE — Progress Notes (Signed)
This patient returns to my office for at risk foot care.  This patient requires this care by a professional since this patient will be at risk due to having  ESRD and thrombocytopenia. This patient is unable to cut nails herself since the patient cannot reach her nails.These nails are painful walking and wearing shoes.  She has painful callus both big toes. This patient presents for at risk foot care today.  General Appearance  Alert, conversant and in no acute stress.  Vascular  Dorsalis pedis and posterior tibial  pulses are palpable  bilaterally.  Capillary return is within normal limits  bilaterally. Temperature is within normal limits  bilaterally.  Neurologic  Senn-Weinstein monofilament wire test within normal limits  bilaterally. Muscle power within normal limits bilaterally.  Nails Thick disfigured discolored nails with subungual debris hallux nails  bilaterally. No evidence of bacterial infection or drainage bilaterally.  Orthopedic  No limitations of motion  feet .  No crepitus or effusions noted.  No bony pathology or digital deformities noted. HAV  B/L.  Skin  normotropic skin with no porokeratosis noted bilaterally.  No signs of infections or ulcers noted.   Pinch callus B/L.  Onychomycosis  Pain in right toes  Pain in left toes  Consent was obtained for treatment procedures.   Mechanical debridement of nails 1-5  bilaterally performed with a nail nipper.  Filed with dremel without incident. Debride pinch callus with #15 blade and dremel tool.   Return office visit     3 months                 Told patient to return for periodic foot care and evaluation due to potential at risk complications.   Helane Gunther DPM  .

## 2022-10-14 ENCOUNTER — Other Ambulatory Visit: Payer: Self-pay | Admitting: Physician Assistant

## 2022-10-14 DIAGNOSIS — N186 End stage renal disease: Secondary | ICD-10-CM | POA: Diagnosis not present

## 2022-10-14 DIAGNOSIS — N2581 Secondary hyperparathyroidism of renal origin: Secondary | ICD-10-CM | POA: Diagnosis not present

## 2022-10-14 DIAGNOSIS — D631 Anemia in chronic kidney disease: Secondary | ICD-10-CM | POA: Diagnosis not present

## 2022-10-14 DIAGNOSIS — D509 Iron deficiency anemia, unspecified: Secondary | ICD-10-CM | POA: Diagnosis not present

## 2022-10-14 DIAGNOSIS — D693 Immune thrombocytopenic purpura: Secondary | ICD-10-CM

## 2022-10-14 DIAGNOSIS — Z992 Dependence on renal dialysis: Secondary | ICD-10-CM | POA: Diagnosis not present

## 2022-10-15 ENCOUNTER — Other Ambulatory Visit: Payer: Self-pay

## 2022-10-15 ENCOUNTER — Inpatient Hospital Stay (HOSPITAL_BASED_OUTPATIENT_CLINIC_OR_DEPARTMENT_OTHER): Payer: Medicare Other | Admitting: Physician Assistant

## 2022-10-15 ENCOUNTER — Inpatient Hospital Stay: Payer: Medicare Other

## 2022-10-15 ENCOUNTER — Other Ambulatory Visit (HOSPITAL_COMMUNITY): Payer: Self-pay | Admitting: Otolaryngology

## 2022-10-15 ENCOUNTER — Other Ambulatory Visit: Payer: Self-pay | Admitting: Otolaryngology

## 2022-10-15 ENCOUNTER — Inpatient Hospital Stay: Payer: Medicare Other | Attending: Hematology and Oncology

## 2022-10-15 VITALS — BP 101/61 | HR 88 | Temp 98.8°F | Resp 17 | Wt 125.0 lb

## 2022-10-15 DIAGNOSIS — N186 End stage renal disease: Secondary | ICD-10-CM | POA: Insufficient documentation

## 2022-10-15 DIAGNOSIS — D649 Anemia, unspecified: Secondary | ICD-10-CM | POA: Diagnosis not present

## 2022-10-15 DIAGNOSIS — C44222 Squamous cell carcinoma of skin of right ear and external auricular canal: Secondary | ICD-10-CM

## 2022-10-15 DIAGNOSIS — R221 Localized swelling, mass and lump, neck: Secondary | ICD-10-CM

## 2022-10-15 DIAGNOSIS — D631 Anemia in chronic kidney disease: Secondary | ICD-10-CM | POA: Diagnosis not present

## 2022-10-15 DIAGNOSIS — D693 Immune thrombocytopenic purpura: Secondary | ICD-10-CM | POA: Diagnosis not present

## 2022-10-15 DIAGNOSIS — K7460 Unspecified cirrhosis of liver: Secondary | ICD-10-CM | POA: Diagnosis not present

## 2022-10-15 DIAGNOSIS — Z992 Dependence on renal dialysis: Secondary | ICD-10-CM | POA: Insufficient documentation

## 2022-10-15 DIAGNOSIS — I120 Hypertensive chronic kidney disease with stage 5 chronic kidney disease or end stage renal disease: Secondary | ICD-10-CM | POA: Insufficient documentation

## 2022-10-15 DIAGNOSIS — E538 Deficiency of other specified B group vitamins: Secondary | ICD-10-CM | POA: Insufficient documentation

## 2022-10-15 DIAGNOSIS — D72819 Decreased white blood cell count, unspecified: Secondary | ICD-10-CM | POA: Diagnosis not present

## 2022-10-15 LAB — SAMPLE TO BLOOD BANK

## 2022-10-15 LAB — CBC WITH DIFFERENTIAL (CANCER CENTER ONLY)
Abs Immature Granulocytes: 0.05 10*3/uL (ref 0.00–0.07)
Basophils Absolute: 0 10*3/uL (ref 0.0–0.1)
Basophils Relative: 1 %
Eosinophils Absolute: 0.1 10*3/uL (ref 0.0–0.5)
Eosinophils Relative: 2 %
HCT: 24.5 % — ABNORMAL LOW (ref 36.0–46.0)
Hemoglobin: 7.7 g/dL — ABNORMAL LOW (ref 12.0–15.0)
Immature Granulocytes: 1 %
Lymphocytes Relative: 20 %
Lymphs Abs: 0.7 10*3/uL (ref 0.7–4.0)
MCH: 28.3 pg (ref 26.0–34.0)
MCHC: 31.4 g/dL (ref 30.0–36.0)
MCV: 90.1 fL (ref 80.0–100.0)
Monocytes Absolute: 0.5 10*3/uL (ref 0.1–1.0)
Monocytes Relative: 13 %
Neutro Abs: 2.2 10*3/uL (ref 1.7–7.7)
Neutrophils Relative %: 63 %
Platelet Count: 184 10*3/uL (ref 150–400)
RBC: 2.72 MIL/uL — ABNORMAL LOW (ref 3.87–5.11)
RDW: 17.4 % — ABNORMAL HIGH (ref 11.5–15.5)
WBC Count: 3.5 10*3/uL — ABNORMAL LOW (ref 4.0–10.5)
nRBC: 0.8 % — ABNORMAL HIGH (ref 0.0–0.2)

## 2022-10-15 LAB — CMP (CANCER CENTER ONLY)
ALT: 11 U/L (ref 0–44)
AST: 19 U/L (ref 15–41)
Albumin: 3.5 g/dL (ref 3.5–5.0)
Alkaline Phosphatase: 108 U/L (ref 38–126)
Anion gap: 10 (ref 5–15)
BUN: 40 mg/dL — ABNORMAL HIGH (ref 8–23)
CO2: 33 mmol/L — ABNORMAL HIGH (ref 22–32)
Calcium: 9.4 mg/dL (ref 8.9–10.3)
Chloride: 96 mmol/L — ABNORMAL LOW (ref 98–111)
Creatinine: 6.78 mg/dL — ABNORMAL HIGH (ref 0.44–1.00)
GFR, Estimated: 6 mL/min — ABNORMAL LOW (ref >60)
Glucose, Bld: 96 mg/dL (ref 70–99)
Potassium: 3.9 mmol/L (ref 3.5–5.1)
Sodium: 139 mmol/L (ref 135–145)
Total Bilirubin: 0.4 mg/dL (ref 0.3–1.2)
Total Protein: 7.2 g/dL (ref 6.5–8.1)

## 2022-10-15 LAB — IRON AND IRON BINDING CAPACITY (CC-WL,HP ONLY)
Iron: 45 ug/dL (ref 28–170)
Saturation Ratios: 15 % (ref 10.4–31.8)
TIBC: 308 ug/dL (ref 250–450)
UIBC: 263 ug/dL (ref 148–442)

## 2022-10-15 LAB — FOLATE: Folate: 10.1 ng/mL (ref >5.9)

## 2022-10-15 LAB — FERRITIN: Ferritin: 173 ng/mL (ref 11–307)

## 2022-10-15 LAB — ABO/RH: ABO/RH(D): A POS

## 2022-10-15 LAB — PREPARE RBC (CROSSMATCH)

## 2022-10-15 LAB — VITAMIN B12: Vitamin B-12: 501 pg/mL (ref 180–914)

## 2022-10-15 MED ORDER — SODIUM CHLORIDE 0.9% IV SOLUTION
250.0000 mL | Freq: Once | INTRAVENOUS | Status: AC
  Administered 2022-10-15: 250 mL via INTRAVENOUS

## 2022-10-15 MED ORDER — DIPHENHYDRAMINE HCL 25 MG PO CAPS
25.0000 mg | ORAL_CAPSULE | Freq: Once | ORAL | Status: AC
  Administered 2022-10-15: 25 mg via ORAL
  Filled 2022-10-15: qty 1

## 2022-10-15 MED ORDER — ACETAMINOPHEN 325 MG PO TABS
650.0000 mg | ORAL_TABLET | Freq: Once | ORAL | Status: AC
  Administered 2022-10-15: 650 mg via ORAL
  Filled 2022-10-15: qty 2

## 2022-10-15 NOTE — Patient Instructions (Signed)

## 2022-10-15 NOTE — Progress Notes (Unsigned)
[facility] [phone]   [phone]  PROGRESS NOTE  Patient Care Team: Merri Brunette, MD as PCP - General Cyndie Chime Genene Churn, MD as Consulting Physician (Oncology) Early, Kristen Loader, MD (Inactive) as Consulting Physician (Vascular Surgery) Center, [facility], Daphine Deutscher, MD as Consulting Physician (Nephrology) Enedina Finner, MD as Referring Physician (Internal Medicine) Marlane Mingle, MD as Consulting Physician (Cardiology)  Hematological/Oncological History # ITP # Anemia in Setting of ESRD on Dialysis # Leukopenia in Setting of Cirrhosis 05/2017: treated with steroid taper and rituximab with Dr. Cyndie Chime 11/18/2021: last visit with Dr. Clelia Croft 04/12/2022: transfer care to Dr. Leonides Schanz   Interval History:  Katie Nunez 67 y.o. female with medical history significant for ITP who presents for a follow up visit. The patient's last visit was 04/12/2022. In the interim since the last visit she has had no major changes in her health.  On exam today Katie Nunez reports her energy levels are stable with occasional episodes of fatigue. She continues to receives dialysis on Tues/Thurs/Sat. She continues on 3L of supplemental oxygen without any changes to her breathing. 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 She denies nausea, vomiting or abdominal pain. She has occasional episodes of constipation. Otherwise she denies any fevers, chills, sweats, chest pain or cough.  A full 10 point ROS is otherwise negative.  MEDICAL HISTORY:  Past Medical History:  Diagnosis Date   Achalasia    Anxiety    Dysplasia of cervix, low grade (CIN 1)    Environmental allergies    "all year long" (12/27/2016)   ESRD (end stage renal disease) on dialysis (HCC)    "TTS; [place]" (12/27/2016)   Fibromyalgia    GERD (gastroesophageal reflux disease)    Gout    Hypertension    IBS (irritable bowel syndrome)    MVP (mitral valve  prolapse)    RA (rheumatoid arthritis) (HCC)    FOLLOWED BY DR. SHANAHAN   Raynaud's disease    Scleroderma (HCC)    Seasonal allergies    Thrombocytopenia (HCC) 07/01/2016   Acute fall to 13,000 07/01/16   Tubular adenoma 01/08/2008   CECUM   Vitamin D deficiency     SURGICAL HISTORY: Past Surgical History:  Procedure Laterality Date   ANKLE FRACTURE SURGERY Right    AV FISTULA PLACEMENT Left 06/28/2016   Procedure: left arm ARTERIOVENOUS (AV) FISTULA CREATION;  Surgeon: Larina Earthly, MD;  Location: [facility] OR;  Service: Vascular;  Laterality: Left;   BASCILIC VEIN TRANSPOSITION Left 09/27/2016   Procedure: LEFT UPPER ARM CEPHALIC VEIN TRANSPOSITION;  Surgeon: Larina Earthly, MD;  Location: [facility] OR;  Service: Vascular;  Laterality: Left;   BREAST BIOPSY     "? side"   CESAREAN SECTION  1994   CO2 LASER OF CERVIX     COLONOSCOPY W/ BIOPSIES  01/08/2008   INSERTION OF DIALYSIS CATHETER Right 06/28/2016   Procedure: INSERTION OF DIALYSIS CATHETER, right internal jugular;  Surgeon: Larina Earthly, MD;  Location: [facility] OR;  Service: Vascular;  Laterality: Right;   MYOMECTOMY     NASAL ENDOSCOPY WITH EPISTAXIS CONTROL N/A 12/29/2019   Procedure: NASAL ENDOSCOPY WITH EPISTAXIS CONTROL;  Surgeon: Newman Pies, MD;  Location: [facility] OR;  Service: ENT;  Laterality: N/A;   PELVIC LAPAROSCOPY  2011   superficial thrombophlebitis Left 07-2014    SOCIAL HISTORY: Social History   Socioeconomic History   Marital status: Married    Spouse name: Not  on file   Number of children: Not on file   Years of education: Not on file   Highest education level: Not on file  Occupational History   Not on file  Tobacco Use   Smoking status: Never   Smokeless tobacco: Never  Vaping Use   Vaping status: Never Used  Substance and Sexual Activity   Alcohol use: No   Drug use: No   Sexual activity: Not Currently    Birth control/protection: Post-menopausal  Other Topics Concern   Not on file  Social History Narrative    Lives in [place]   Social Determinants of Health   Financial Resource Strain: Not on file  Food Insecurity: No Food Insecurity (06/24/2022)   Hunger Vital Sign    Worried About Running Out of Food in the Last Year: Never true    Ran Out of Food in the Last Year: Never true  Transportation Needs: No Transportation Needs (06/24/2022)   PRAPARE - Administrator, Civil Service (Medical): No    Lack of Transportation (Non-Medical): No  Physical Activity: Not on file  Stress: Not on file  Social Connections: Unknown (05/22/2021)   [facility]   Social Network    Social Network: Not on file  Intimate Partner Violence: Not At Risk (06/24/2022)   Humiliation, Afraid, Rape, and Kick questionnaire    Fear of Current or Ex-Partner: No    Emotionally Abused: No    Physically Abused: No    Sexually Abused: No    FAMILY HISTORY: Family History  Problem Relation Age of Onset   Hypertension Mother    Diabetes Mother    Heart disease Father    Hypertension Maternal Aunt    Diabetes Maternal Grandmother    Heart disease Paternal Grandfather    Cerebral palsy Cousin        1ST COUSIN?   Diabetes Paternal Grandmother     ALLERGIES:  is allergic to other, nitrofuran derivatives, savella [milnacipran hcl], and tape.  MEDICATIONS:  Current Outpatient Medications  Medication Sig Dispense Refill   albuterol (VENTOLIN HFA) 108 (90 Base) MCG/ACT inhaler Inhale 2 puffs into the lungs every 6 (six) hours as needed for wheezing or shortness of breath. 8 g 6   ambrisentan (LETAIRIS) 5 MG tablet Take 10 mg by mouth daily.     aspirin EC 81 MG tablet Take 1 tablet (81 mg total) by mouth daily. Swallow whole. 30 tablet 11   atorvastatin (LIPITOR) 80 MG tablet Take 1 tablet (80 mg total) by mouth daily. 90 tablet 3   azelastine (ASTELIN) 0.1 % nasal spray Place 1 spray into both nostrils 2 (two) times daily as needed for rhinitis. Use in each nostril as  directed 30 mL 12   bisacodyl (DULCOLAX) 5 MG EC tablet Take 1 tablet (5 mg total) by mouth daily as needed for severe constipation. 10 tablet 0   calcium acetate (PHOSLO) 667 MG capsule Take by mouth.     camphor-menthol (SARNA) lotion Apply topically 2 (two) times daily. 222 mL 0   clonazePAM (KLONOPIN) 0.5 MG tablet Take 1 tablet (0.5 mg total) by mouth 2 (two) times daily as needed for anxiety. 30 tablet 0   co-enzyme Q-10 30 MG capsule Take 7 capsules (210 mg total) by mouth 3 (three) times daily. 1 capsule 1   cyclobenzaprine (FLEXERIL) 5 MG tablet Take 1 tablet (5 mg total) by mouth every 8 (eight) hours as needed for muscle spasms.  30 tablet 0   docusate sodium (COLACE) 100 MG capsule Take 2 capsules (200 mg total) by mouth daily. 60 capsule 0   famotidine (PEPCID) 20 MG tablet Take 1 tablet (20 mg total) by mouth daily as needed for heartburn or indigestion. (Patient taking differently: Take 40 mg by mouth 2 (two) times daily.) 30 tablet 0   folic acid (FOLVITE) 1 MG tablet Take 1 tablet (1 mg total) by mouth daily. 90 tablet 2   gabapentin (NEURONTIN) 100 MG capsule Take 1 capsule (100 mg total) by mouth 3 (three) times daily. Take 1 capsule in morning and two at night 90 capsule 0   hydrocerin (EUCERIN) CREA Apply 1 application topically 2 (two) times daily. To dry skin--avoid IV site 454 g 0   hydrocortisone (ANUSOL-HC) 2.5 % rectal cream Place 1 Application rectally 2 (two) times daily. 30 g 0   hydrOXYzine (ATARAX/VISTARIL) 25 MG tablet Take 25 mg by mouth every 8 (eight) hours as needed for itching.      levocetirizine (XYZAL) 5 MG tablet SMARTSIG:1 Tablet(s) By Mouth Every Evening     lidocaine-prilocaine (EMLA) cream Apply 1 application topically as needed. Apply to toes as needed for pain 30 g 3   lubiprostone (AMITIZA) 8 MCG capsule Take 8 mcg by mouth 2 (two) times daily with a meal.     multivitamin (RENA-VIT) TABS tablet Take 1 tablet by mouth daily at 6 (six) AM. 30 tablet 0    oxymetazoline (AFRIN) 0.05 % nasal spray Place 1 spray into both nostrils 2 (two) times daily as needed for congestion. 30 mL 0   polyethylene glycol (MIRALAX / GLYCOLAX) 17 g packet Take 17 g by mouth 2 (two) times daily. 14 each 0   Selexipag (UPTRAVI) 800 MCG TABS Take 1 tablet (800 mcg total) by mouth in the morning and at bedtime. 60 tablet 0   sodium chloride (OCEAN) 0.65 % SOLN nasal spray Place 1 spray into both nostrils as needed for congestion. 44 mL 0   vitamin B-12 (CYANOCOBALAMIN) 100 MCG tablet Take 1 tablet (100 mcg total) by mouth daily. 90 tablet 1   WINREVAIR subcutaneous injection Inject into the skin every 21 ( twenty-one) days.     benzonatate (TESSALON) 100 MG capsule Take 1 capsule (100 mg total) by mouth every 8 (eight) hours. (Patient not taking: Reported on 10/15/2022) 21 capsule 0   fluticasone (FLONASE) 50 MCG/ACT nasal spray Place 1 spray into both nostrils daily for 7 days. 15.8 mL 0   nitroGLYCERIN (NITRO-DUR) 0.2 mg/hr patch Place 1 patch (0.2 mg total) onto the skin daily. Apply near great toes bilateral to top of foot every morning.  May remove at night (Patient not taking: Reported on 09/01/2022) 30 patch 4   sodium chloride (OCEAN) 0.65 % SOLN nasal spray Place 1 spray into both nostrils 5 (five) times daily. At least 5 times a day (Patient not taking: Reported on 10/15/2022)  0   No current facility-administered medications for this visit.    REVIEW OF SYSTEMS:   Constitutional: ( - ) fevers, ( - )  chills , ( - ) night sweats Eyes: ( - ) blurriness of vision, ( - ) double vision, ( - ) watery eyes Ears, nose, mouth, throat, and face: ( - ) mucositis, ( - ) sore throat Respiratory: ( - ) cough, ( - ) dyspnea, ( - ) wheezes Cardiovascular: ( - ) palpitation, ( - ) chest discomfort, ( - ) lower extremity swelling  Gastrointestinal:  ( - ) nausea, ( - ) heartburn, ( - ) change in bowel habits Skin: ( - ) abnormal skin rashes Lymphatics: ( - ) new  lymphadenopathy, ( - ) easy bruising Neurological: ( - ) numbness, ( - ) tingling, ( - ) new weaknesses Behavioral/Psych: ( - ) mood change, ( - ) new changes  All other systems were reviewed with the patient and are negative.  PHYSICAL EXAMINATION: Vitals:   10/15/22 1110  BP: 101/61  Pulse: 88  Resp: 17  Temp: 98.8 F (37.1 C)  SpO2: 96%   Filed Weights   10/15/22 1110  Weight: 125 lb (56.7 kg)    GENERAL: Well-appearing middle-aged African-American female, alert, no distress and comfortable SKIN: skin color, texture, turgor are normal, no rashes or significant lesions EYES: conjunctiva are pink and non-injected, sclera clear LUNGS: clear to auscultation and percussion with normal breathing effort HEART: regular rate & rhythm and no murmurs and no lower extremity edema Musculoskeletal: no cyanosis of digits and no clubbing  PSYCH: alert & oriented x 3, fluent speech NEURO: no focal motor/sensory deficits  LABORATORY DATA:  I have reviewed the data as listed    Latest Ref Rng & Units 10/15/2022   10:53 AM 09/01/2022    2:59 PM 06/25/2022   12:57 AM  CBC  WBC 4.0 - 10.5 K/uL 3.5  3.1  2.5   Hemoglobin 12.0 - 15.0 g/dL 7.7  52.8  9.8   Hematocrit 36.0 - 46.0 % 24.5  32.4  31.8   Platelets 150 - 400 K/uL 184  200  128        Latest Ref Rng & Units 10/15/2022   10:53 AM 09/01/2022    2:59 PM 06/25/2022   12:57 AM  CMP  Glucose 70 - 99 mg/dL 96  82  413   BUN 8 - 23 mg/dL 40  46  35   Creatinine 0.44 - 1.00 mg/dL 2.44  0.10  2.72   Sodium 135 - 145 mmol/L 139  137  134   Potassium 3.5 - 5.1 mmol/L 3.9  4.3  3.5   Chloride 98 - 111 mmol/L 96  96  92   CO2 22 - 32 mmol/L 33  27  29   Calcium 8.9 - 10.3 mg/dL 9.4  8.9  8.8   Total Protein 6.5 - 8.1 g/dL 7.2  7.3  6.4   Total Bilirubin 0.3 - 1.2 mg/dL 0.4  0.4  0.5   Alkaline Phos 38 - 126 U/L 108  112  84   AST 15 - 41 U/L 19  28  18    ALT 0 - 44 U/L 11  20  14     RADIOGRAPHIC STUDIES: No results  found.  ASSESSMENT & PLAN Katie Nunez 67 y.o. female with medical history significant for ITP who presents for a follow up visit.  # Normocytic anemia --Multifactorial 2/2 to ESRD, vitamin B12 and folate deficiency. --Currently on PO vitamin B12 and folic acid supplementation --Workup from 04/12/2022 ruled out monoclonal gammopathy and iron deficiency. --Due to marked decline of anemia with Hgb 7.7 (previously 10.1), we will arrange for 1 unit of PRBC. B12 and folate levels have improved and back to normal range. Recommend to continue PO supplementation. Iron panel shows no deficiency --Patient will return in 2 weeks for a lab check. If Hgb <10, then we will discuss initiation of Epo injections.   # ITP -- Patient last required steroid therapy in May  2019. Also received rituximab at that time. -- Labs today show platelet count normal at 184K -- No clear indication for intervention at this time.  Would consider replacing steroid therapy in the event her platelets were to drop below 30.  # Leukopenia -- Stable over last several years, likely secondary to cirrhosis. -- Cirrhosis and ITP are likely reasons why platelets are mildly decreased.   Follow up: --2 weeks: labs only  Orders Placed This Encounter  Procedures   Vitamin B12    Standing Status:   Future    Number of Occurrences:   1    Standing Expiration Date:   10/15/2023   Folate, Serum    Standing Status:   Future    Number of Occurrences:   1    Standing Expiration Date:   10/15/2023   Ferritin    Standing Status:   Future    Number of Occurrences:   1    Standing Expiration Date:   10/15/2023   Methylmalonic acid, serum    Standing Status:   Future    Number of Occurrences:   1    Standing Expiration Date:   10/15/2023   Iron and TIBC    Standing Status:   Future    Standing Expiration Date:   10/15/2023   Informed Consent Details: Physician/Practitioner Attestation; Transcribe to consent form and obtain patient  signature    Standing Status:   Future    Standing Expiration Date:   10/15/2023    Order Specific Question:   Physician/Practitioner attestation of informed consent for blood and or blood product transfusion    Answer:   I, the physician/practitioner, attest that I have discussed with the patient the benefits, risks, side effects, alternatives, likelihood of achieving goals and potential problems during recovery for the procedure that I have provided informed consent.    Order Specific Question:   Product(s)    Answer:   All Product(s)   Sample to Blood Bank    Standing Status:   Future    Number of Occurrences:   1    Standing Expiration Date:   10/15/2023   ABO/RH    Standing Status:   Future    Standing Expiration Date:   10/15/2023    All questions were answered. The patient knows to call the clinic with any problems, questions or concerns.  I have spent a total of 30 minutes minutes of face-to-face and non-face-to-face time, preparing to see the patient,  performing a medically appropriate examination, counseling and educating the patient, ordering medications/tests/procedures,documenting clinical information in the electronic health record, independently interpreting results and communicating results to the patient, and care coordination.   Georga Kaufmann PA-C Dept of Hematology and Oncology [facility] Phone: [phone]  10/15/2022 3:40 PM

## 2022-10-16 DIAGNOSIS — N186 End stage renal disease: Secondary | ICD-10-CM | POA: Diagnosis not present

## 2022-10-16 DIAGNOSIS — N2581 Secondary hyperparathyroidism of renal origin: Secondary | ICD-10-CM | POA: Diagnosis not present

## 2022-10-16 DIAGNOSIS — D631 Anemia in chronic kidney disease: Secondary | ICD-10-CM | POA: Diagnosis not present

## 2022-10-16 DIAGNOSIS — D509 Iron deficiency anemia, unspecified: Secondary | ICD-10-CM | POA: Diagnosis not present

## 2022-10-16 DIAGNOSIS — Z992 Dependence on renal dialysis: Secondary | ICD-10-CM | POA: Diagnosis not present

## 2022-10-16 LAB — TYPE AND SCREEN
ABO/RH(D): A POS
Antibody Screen: NEGATIVE
Unit division: 0

## 2022-10-16 LAB — BPAM RBC
Blood Product Expiration Date: 202411042359
ISSUE DATE / TIME: 202410041433
Unit Type and Rh: 6200

## 2022-10-16 IMAGING — MR MR KNEE*L* W/O CM
4 of 6 series · 23 of 40 positions shown · non-contrast
Comparison: X-ray knee 10/01/2020.

CLINICAL DATA: Patient complains of left knee pain that is worse at
night. Patient reports left knee locks in place for the last 6
months.

EXAM:
MRI OF THE LEFT KNEE WITHOUT CONTRAST
TECHNIQUE: Multiplanar, multisequence MR imaging of the knee was performed. No
intravenous contrast was administered.

[Series 4: T2 fat-sat · coronal · 4.0mm · 0.59mm/px · 6 of 25 slices shown (1 of 2)]
[im 1/25]
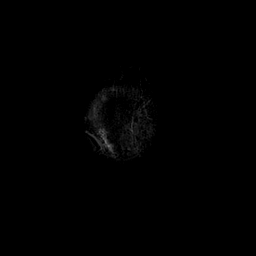
[im 5/25]
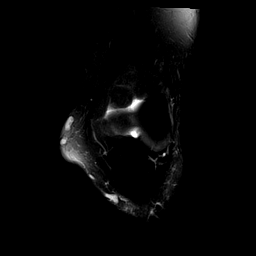
[im 10/25]
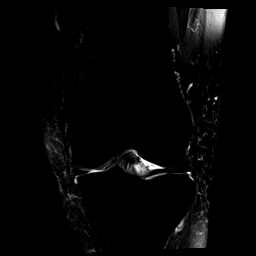
[im 15/25]
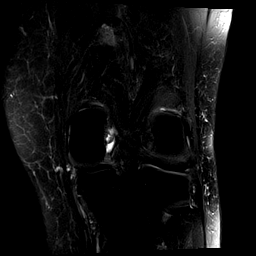
[im 20/25]
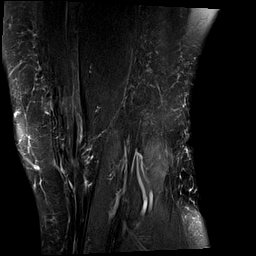
[im 25/25]
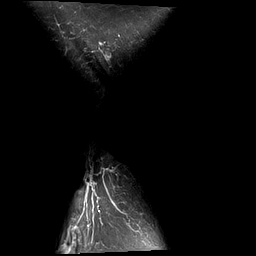

[Series 5: T1 · coronal · 4.0mm · 0.29mm/px · 3 of 25 slices shown]
[im 5/25]
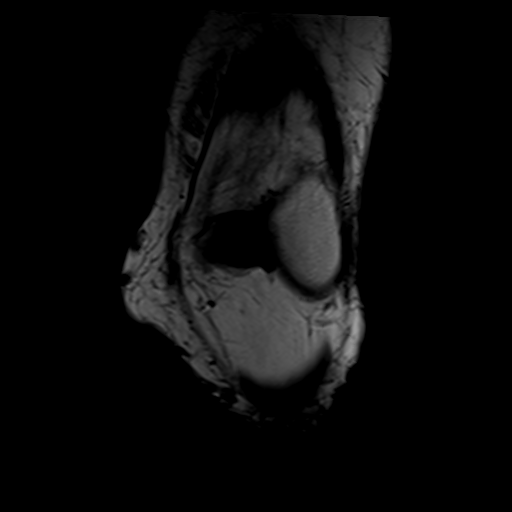
[im 15/25]
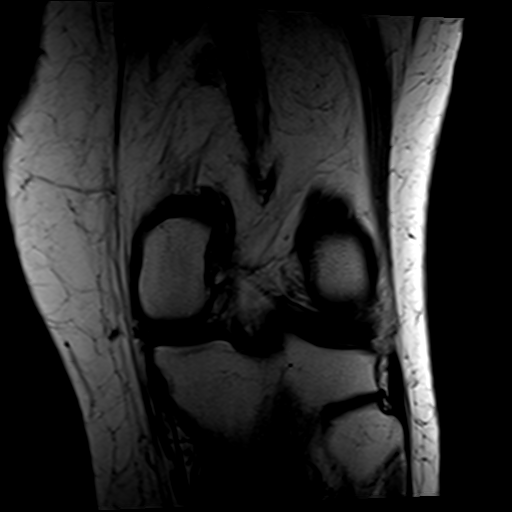
[im 25/25]
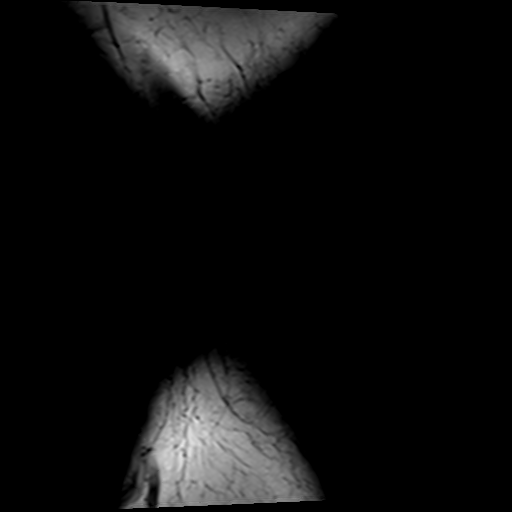

[Series 7: T2 fat-sat · sagittal · 3.0mm · 0.29mm/px · 7 of 28 slices shown (2 of 2)]
[im 1/28]
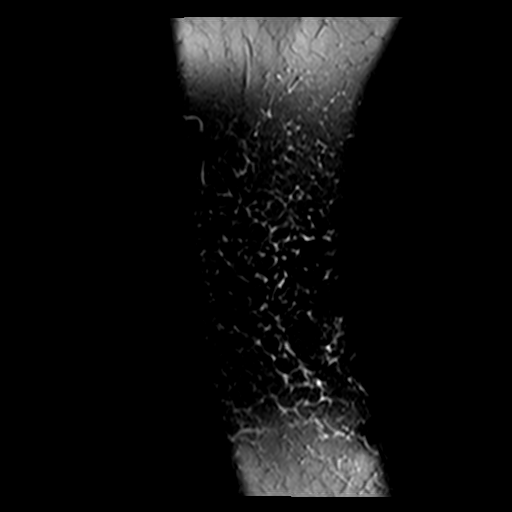
[im 5/28]
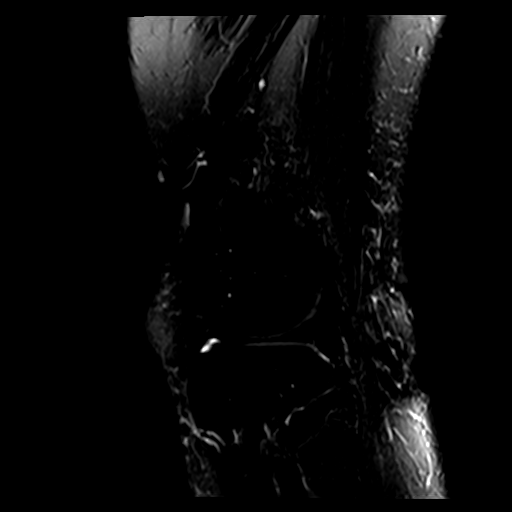
[im 10/28]
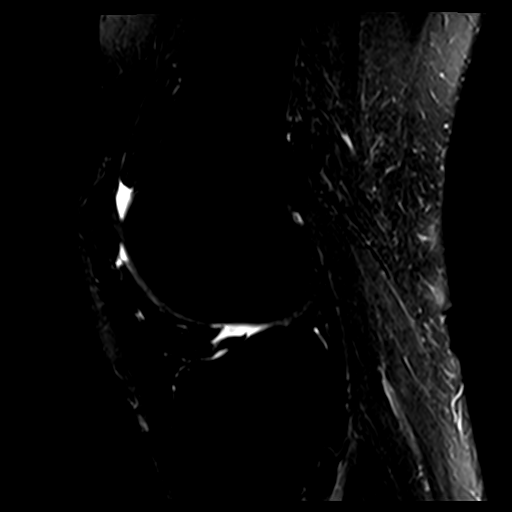
[im 14/28]
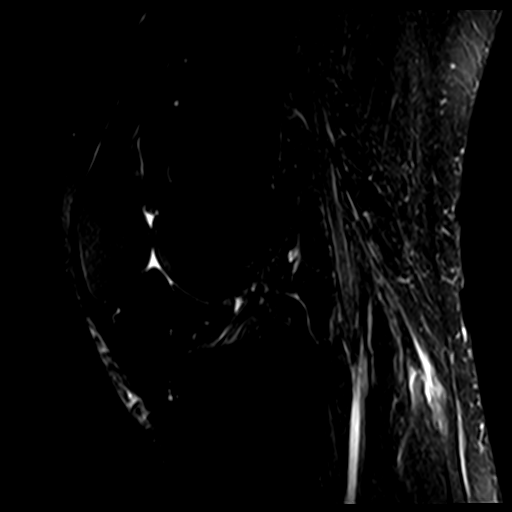
[im 19/28]
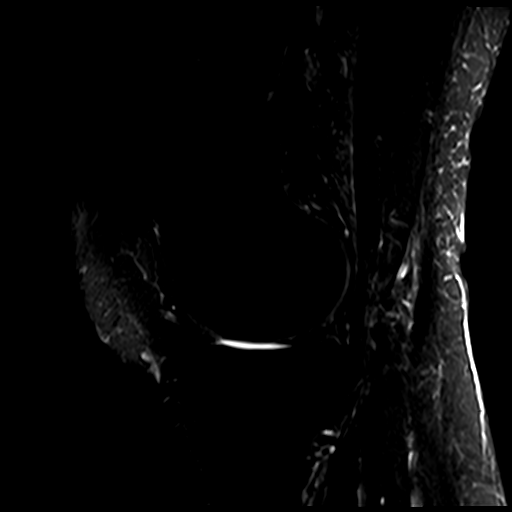
[im 23/28]
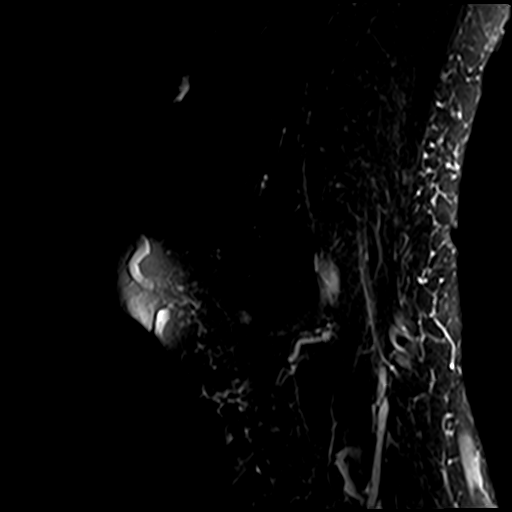
[im 28/28]
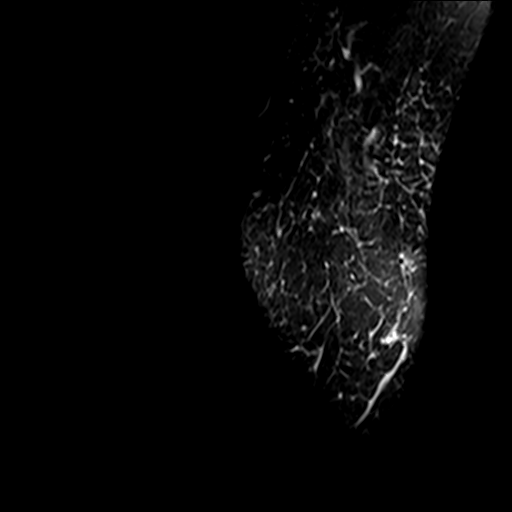

[Series 8: PD fat-sat · sagittal · 3.0mm · 0.29mm/px · 7 of 28 slices shown]
[im 1/28]
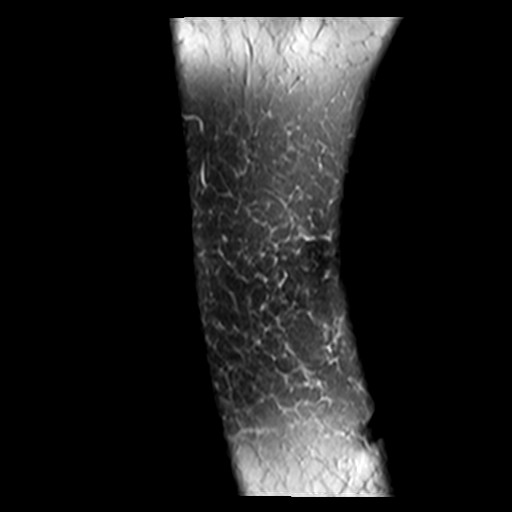
[im 5/28]
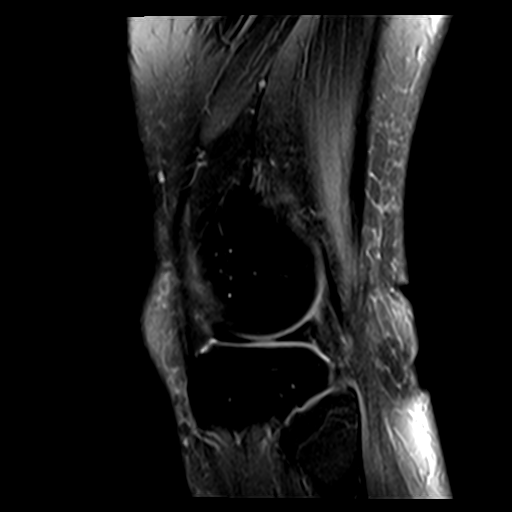
[im 10/28]
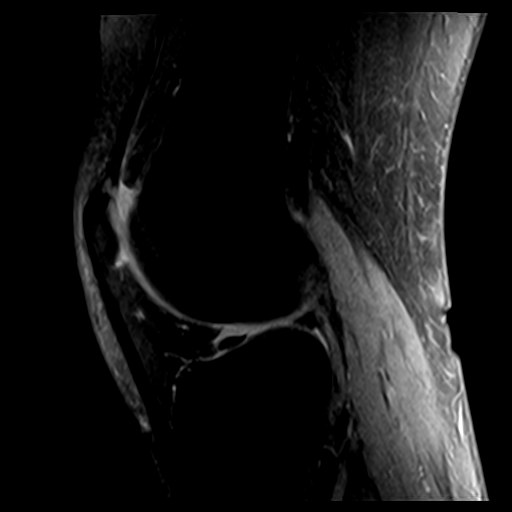
[im 14/28]
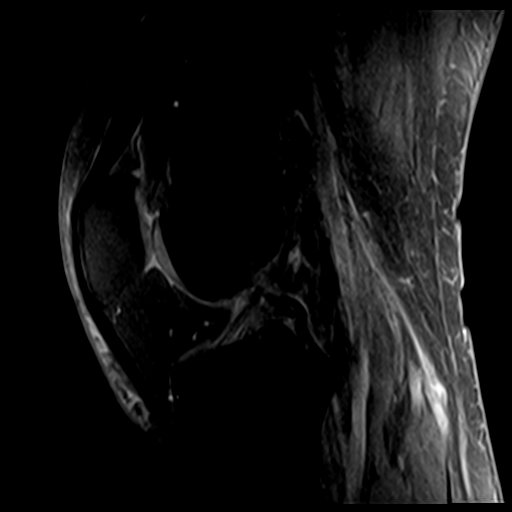
[im 19/28]
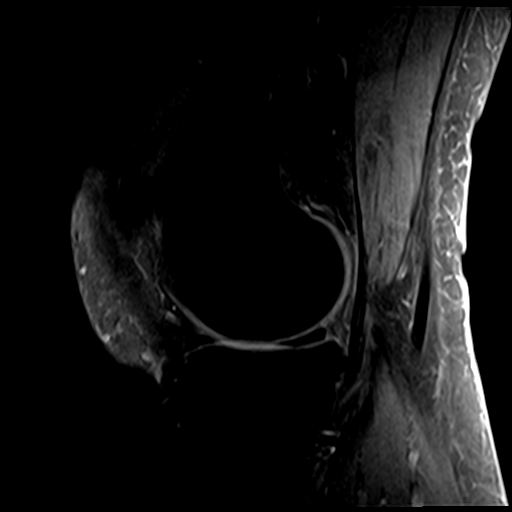
[im 23/28]
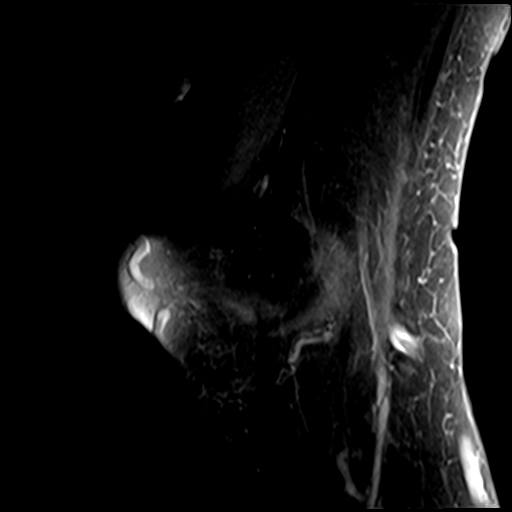
[im 28/28]
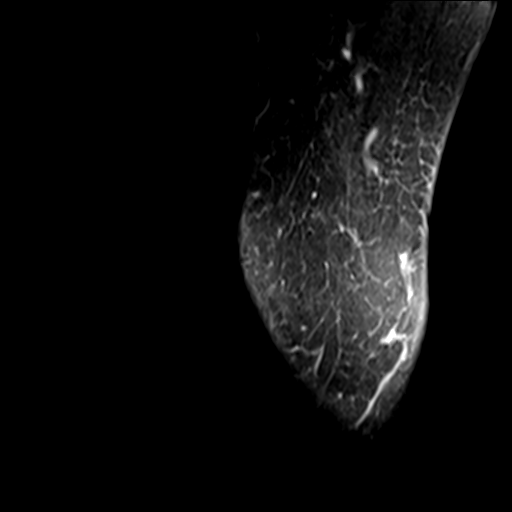

[23 of 40 positions shown; findings below may reference images not displayed]

FINDINGS: MENISCI

Medial meniscus: Mild degeneration. Posterior horn is somewhat
diminutive. No well-defined medial meniscal tear.

Lateral meniscus: Mild intrasubstance degeneration without
well-defined tear.

LIGAMENTS

Cruciates: Intact ACL and PCL.

Collaterals: Medial collateral ligament is intact. Lateral
collateral ligament complex is intact.

CARTILAGE

Patellofemoral:  No chondral defect.

Medial:  No chondral defect.

Lateral: No chondral defect.

Joint: No joint effusion. Normal Hoffa's fat. No plical thickening.

Popliteal Fossa: No Baker's cyst.  Intact popliteus tendon.

Extensor Mechanism: Intact quadriceps tendon and patellofemoral
tendon.

Bones: No focal marow signal abnormality. No fracture or
dislocation.

Other: Preserved muscle bulk and signal intensity. Minimal
subcutaneous edema. No fluid collections.
IMPRESSION: 1. Mild intrasubstance degeneration of the menisci without
well-defined tear.
2. Otherwise, unremarkable MRI of the left knee.

## 2022-10-18 ENCOUNTER — Telehealth: Payer: Self-pay | Admitting: Physician Assistant

## 2022-10-18 ENCOUNTER — Encounter: Payer: Self-pay | Admitting: Physician Assistant

## 2022-10-18 DIAGNOSIS — D696 Thrombocytopenia, unspecified: Secondary | ICD-10-CM | POA: Diagnosis not present

## 2022-10-18 DIAGNOSIS — I50812 Chronic right heart failure: Secondary | ICD-10-CM | POA: Diagnosis not present

## 2022-10-18 DIAGNOSIS — I69351 Hemiplegia and hemiparesis following cerebral infarction affecting right dominant side: Secondary | ICD-10-CM | POA: Diagnosis not present

## 2022-10-18 DIAGNOSIS — Z23 Encounter for immunization: Secondary | ICD-10-CM | POA: Diagnosis not present

## 2022-10-18 DIAGNOSIS — I7300 Raynaud's syndrome without gangrene: Secondary | ICD-10-CM | POA: Diagnosis not present

## 2022-10-18 DIAGNOSIS — Z0000 Encounter for general adult medical examination without abnormal findings: Secondary | ICD-10-CM | POA: Diagnosis not present

## 2022-10-18 DIAGNOSIS — I272 Pulmonary hypertension, unspecified: Secondary | ICD-10-CM | POA: Diagnosis not present

## 2022-10-18 DIAGNOSIS — D631 Anemia in chronic kidney disease: Secondary | ICD-10-CM | POA: Diagnosis not present

## 2022-10-18 DIAGNOSIS — K7460 Unspecified cirrhosis of liver: Secondary | ICD-10-CM | POA: Diagnosis not present

## 2022-10-18 DIAGNOSIS — N2581 Secondary hyperparathyroidism of renal origin: Secondary | ICD-10-CM | POA: Diagnosis not present

## 2022-10-18 DIAGNOSIS — N186 End stage renal disease: Secondary | ICD-10-CM | POA: Diagnosis not present

## 2022-10-19 DIAGNOSIS — Z992 Dependence on renal dialysis: Secondary | ICD-10-CM | POA: Diagnosis not present

## 2022-10-19 DIAGNOSIS — D631 Anemia in chronic kidney disease: Secondary | ICD-10-CM | POA: Diagnosis not present

## 2022-10-19 DIAGNOSIS — N186 End stage renal disease: Secondary | ICD-10-CM | POA: Diagnosis not present

## 2022-10-19 DIAGNOSIS — N2581 Secondary hyperparathyroidism of renal origin: Secondary | ICD-10-CM | POA: Diagnosis not present

## 2022-10-19 DIAGNOSIS — D509 Iron deficiency anemia, unspecified: Secondary | ICD-10-CM | POA: Diagnosis not present

## 2022-10-20 ENCOUNTER — Other Ambulatory Visit: Payer: Self-pay | Admitting: Otolaryngology

## 2022-10-20 DIAGNOSIS — R221 Localized swelling, mass and lump, neck: Secondary | ICD-10-CM

## 2022-10-21 ENCOUNTER — Ambulatory Visit: Payer: Medicare Other | Admitting: Medical Genetics

## 2022-10-21 VITALS — BP 88/46 | HR 88 | Ht 65.0 in | Wt 122.1 lb

## 2022-10-21 DIAGNOSIS — I610 Nontraumatic intracerebral hemorrhage in hemisphere, subcortical: Secondary | ICD-10-CM

## 2022-10-21 DIAGNOSIS — R040 Epistaxis: Secondary | ICD-10-CM

## 2022-10-21 DIAGNOSIS — D631 Anemia in chronic kidney disease: Secondary | ICD-10-CM | POA: Diagnosis not present

## 2022-10-21 DIAGNOSIS — G8191 Hemiplegia, unspecified affecting right dominant side: Secondary | ICD-10-CM

## 2022-10-21 DIAGNOSIS — D509 Iron deficiency anemia, unspecified: Secondary | ICD-10-CM | POA: Diagnosis not present

## 2022-10-21 DIAGNOSIS — Z992 Dependence on renal dialysis: Secondary | ICD-10-CM | POA: Diagnosis not present

## 2022-10-21 DIAGNOSIS — N2581 Secondary hyperparathyroidism of renal origin: Secondary | ICD-10-CM | POA: Diagnosis not present

## 2022-10-21 DIAGNOSIS — N186 End stage renal disease: Secondary | ICD-10-CM | POA: Diagnosis not present

## 2022-10-21 LAB — METHYLMALONIC ACID, SERUM: Methylmalonic Acid, Quantitative: 951 nmol/L — ABNORMAL HIGH (ref 0–378)

## 2022-10-21 NOTE — Progress Notes (Signed)
GENETIC COUNSELING NEW PATIENT EVALUATION Patient name: Katie Nunez DOB: 1955-10-19 Age: 67 y.o. 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 female who presents today for an initial genetics evaluation for possible hereditary hemorrhagic telangiectasia (HHT). She is accompanied by her husband at today's visit.  Prior genetic testing has not been performed.   Family History: See pedigree obtained during today's visit under History->Family->Pedigree.  The family history was notable for the following: Son, 91 yo, unaffected.  Paternal Family History Father deceased in his 44s, from an unknown heart disease. Uncle, deceased, with stomach cancer. One aunt and three uncles, unaffected. Maternal Family History Mother, deceased at 26 yo, from a brain aneurysm. Uncle, deceased, with colorectal cancer at an unknown age. Aunt, deceased, with unknown heart condition and reddish birthmarks. Grandmother, deceased, with an enlarged heart.  Mother's ethnicity: African American Father's ethnicity: African American Consangunity: Denies   Prior Genetic testing: None  Genetic Counseling: Katie Nunez is a 67 y.o. female with a complex medical history.  She presents today due to newly identified oral mucosal lesions suspicious for telangiectasias.  Katie Nunez was seen by Despina Arias, MD at [facility] on 08/26/2022 due to coughing up blood.  At this appointment, it was discovered that Katie Nunez had telangiectasias on the lips and tongue.  Additionally, she has a history of recurrent nosebleeds requiring 2 cauterizations beginning in adulthood during a hospitalization.  She also liver lesions which, in the light of the telangiectasias, have been  questioned to be intrahepatic AVMs.  Lastly, Katie Nunez has a history of stroke including a known acute hemorrhage in the subcortical left frontal white matter and a few remote hemorrhages in the bilateral cerebrum in 2021.  She presents to clinic today for further evaluation of possible hereditary hemorrhagic telangiectasia (HHT) and consideration of genetic testing.  Hereditary hemorrhagic telangiectasia (HHT) is characterized by the presence of multiple arteriovenous malformations (AVMs) that lack intervening capillaries and result in direct connections between arteries and veins. The most common clinical manifestation is spontaneous and recurrent nosebleeds (epistaxis) beginning on average at age 7 years. Telangiectases (small AVMs) are characteristically found on the lips, tongue, buccal and gastrointestinal (GI) mucosa, face, and fingers. The appearance of telangiectases is generally later than epistaxis but may be during childhood. Large AVMs occur most often in the lungs, liver, or brain; complications from bleeding or shunting may be sudden and catastrophic. A minority of individuals with HHT have GI bleeding, which is rarely seen before age 8 years. (Gene[employer], 2021)  It was discussed that each person possesses two copies of each gene. Variants in genes related to HHT are typically inherited in an autosomal dominant manner, meaning there is a pathogenic (disease-causing) variant on one of two copies of the gene.  Thus, there is a 1 in 2, or 50%, chance that these variants, and the subsequent risk, are passed down from each positive family member to their children.  The genes associated with HHT include ACVRL1, ENG, and SMAD4. Typically, HHT is inherited from a parent, but rarely it can occur for the first time in an individual themselves.  Katie Nunez is not aware of other family members with this diagnosis, however her mother had a brain aneurysm and she recalls a maternal aunt having similar  reddish birthmarks. Additionally, her  father passed from an unknown heart condition in his 30s.  Genetic testing for HHT may clarify Katie Nunez's diagnosis as she has some associated symptoms but with onset at a later age than is typically expected.  It may also provide information regarding changes to healthcare management and surveillance for Katie Nunez and her son Joseph's risk for the condition.  We discussed the benefits and limitations of testing including the types of results, information that may be gained, cost, and turnaround time.  Ms. Martinie provided verbal consent and a buccal swab was obtained for the [facility] Hereditary Hemorrhagic Telangiectasisa and Vascular Malformations Panel (6 genes).  Results are expected in 1-2 months, at which point we will reach out with more information.  Recommendations: [facility] Hereditary Hemorrhagic Telangiectasisa and Vascular Malformations Panel (6 genes) Continue follow-up with current healthcare providers as recommended.  Date: 10/22/2022 Time: 9:01 Total time spent: 60  Lambert Mody MS [facility] Certified Genetic Counselor [facility]  I have personally counseled the patient/family, spending > 50% of total time on counseling and coordination of care as outlined.

## 2022-10-21 NOTE — Progress Notes (Signed)
MEDICAL GENETICS NEW PATIENT EVALUATION  Patient name: Katie Nunez DOB: [birth year] Age: 68 y.o. 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 In addition, her symptoms occurred within the last few years, which is not typical for HHT as the clinical features generally start to appear at younger ages. She has a diagnosis of scleroderma, which can be associated with the development of telangiectasias. In addition, it is possible one of her medications may also cause telangiectasias. However, Katie Nunez was interested in pursuing testing for this condition. A buccal sample was obtained for HHT testing through [facility], with results expected in 1 month. We will contact her when they return, and provide additional recommendations based on her results. She should otherwise continue follow up with her current providers per their recommendations.  Recommendations: HHT panel through [facility] - results expected in 1 month. Continue follow up with current medical providers per their recommendations.  Follow up will be based on the results of her testing.   HPI: Katie Nunez is a 67 y.o. assigned female at birth who presents today for an initial genetics evaluation for possible hereditary hemorrhagic telangiectasia (HHT). She is accompanied by her spouse, who helped provide the history. This information, along with a review of pertinent records, labs, and radiology studies, is summarized below.  Katie Nunez has multiple medical concerns, including scleroderma, starting in her 30s. She also has been diagnosed with Sjogren disease. Katie Nunez has had  nosebleeds starting in the past few years, some of which require cauterization. She sometimes will cough up blood. She had seen Dr. Ernestene Kiel in 08/2022 who thought she had telangiectasias in her mouth, and in combination with her epistaxis could be related to HHT. She had a stroke a few years ago due to an intracranial bleed. She does not have any history of an arteriovenous malformation in her brain or other organs. She does not have GI bleeding.  Please see her multiple medical concerns below. 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 and epistaxis - Cardiology: sees a specialist at [facility] - GI: cirrhosis, blood clots in the liver - Rheumatology: nodules on neck found to be Sjogren disease  Past Medical History: Past Medical History:  Diagnosis Date   Achalasia    Anxiety    Dysplasia of cervix, low grade (CIN 1)    Environmental allergies    "all year long" (12/27/2016)   ESRD (end stage renal disease) on dialysis (HCC)    "TTS; [facility]" (12/27/2016)   Fibromyalgia    GERD (gastroesophageal reflux disease)    Gout    Hypertension    IBS (irritable bowel syndrome)    MVP (mitral valve prolapse)    RA (rheumatoid arthritis) (HCC)    FOLLOWED BY DR. SHANAHAN   Raynaud's disease    Scleroderma (HCC)    Seasonal allergies    Thrombocytopenia (HCC) 07/01/2016   Acute fall to 13,000 07/01/16   Tubular adenoma 01/08/2008   CECUM   Vitamin D deficiency    Patient Active Problem List   Diagnosis Date Noted   Pain due to onychomycosis of toenails of both feet 10/13/2022  Colitis 06/25/2022   Elevated troponin 06/25/2022   Prolonged QT interval 06/25/2022   Chronic diastolic CHF (congestive heart failure) (HCC) 06/25/2022   GERD (gastroesophageal reflux disease) 06/25/2022   GAD (generalized anxiety disorder) 06/25/2022   Hyperlipidemia 06/25/2022   Atypical chest pain 06/24/2022   Allergic rhinitis 04/07/2022   Ankle swelling  11/26/2021   Neck pain 10/30/2020   Knee locking, left 10/05/2020   Abnormality of gait 02/25/2020   Protein-calorie malnutrition, severe 01/19/2020   Epistaxis    Sleep disturbance    Slow transit constipation    Hemorrhoids    Chronic systolic congestive heart failure (HCC)    End-stage renal disease on hemodialysis (HCC)    Right hemiparesis (HCC)    Pressure injury of skin 12/20/2019   Intraparenchymal hemorrhage of brain (HCC) 12/18/2019   ICH (intracerebral hemorrhage) (HCC) 12/12/2019   Viral disease 08/22/2019   Chronic right-sided heart failure (HCC) 07/09/2019   Supraventricular tachycardia (HCC) 07/09/2019   Other cirrhosis of liver (HCC) 06/27/2019   Heart failure (HCC) 02/21/2019   Heart palpitations 08/14/2018   Other fluid overload 07/27/2018   Encounter for long-term (current) use of other medications 01/18/2018   Hypothyroidism 01/18/2018   Myalgia and myositis 01/18/2018   Chronic nephritis 01/18/2018   Other long term (current) drug therapy 01/18/2018   Sleep apnea 01/18/2018   Unspecified persistent mental disorders due to conditions classified elsewhere 01/18/2018   Venous reflux 01/18/2018   Vitamin B12 deficiency 01/18/2018   Vitamin D deficiency 01/18/2018   Obstructive lung disease (HCC) 12/30/2017   Other pruritus 12/27/2017   Eruption cyst 12/19/2017   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 (HCC) 11/28/2017   Chronic cough 11/11/2017   Pulmonary hypertension (HCC) 11/11/2017   Pulmonary arterial hypertension (HCC) 10/07/2017   Acute ITP (HCC) 01/05/2017   ESRD (end stage renal disease) (HCC) 12/28/2016   Encounter for removal of sutures 09/09/2016   Coagulation defect, unspecified (HCC) 09/01/2016   Underimmunization status 08/05/2016   Hemolytic anemia (HCC) 07/26/2016   Epistaxis, recurrent 07/26/2016   Unspecified protein-calorie malnutrition (HCC) 07/13/2016   Aftercare including intermittent dialysis (HCC)  07/07/2016   Anemia in chronic kidney disease 07/07/2016   Hypokalemia 07/07/2016   Iron deficiency anemia, unspecified 07/07/2016   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   Nonrheumatic mitral (valve) prolapse 07/07/2016   Irritable bowel syndrome 07/07/2016   Other secondary thrombocytopenia 07/07/2016   Secondary hyperparathyroidism of renal origin (HCC) 07/07/2016   Thrombocytopenia (HCC) 07/01/2016   ARF (acute renal failure) (HCC) 06/25/2016   Anxiety 06/25/2016   Bilateral carpal tunnel syndrome 10/22/2015   Chronic gout without tophus 10/22/2015   Chronic nonintractable headache 10/08/2015   Fibroid uterus 01/03/2012   H/O vitamin D deficiency 01/03/2012   Post-menopausal 01/03/2012   Hereditary and idiopathic peripheral neuropathy 11/19/2011   Intestinal malabsorption 11/19/2011   Lichen planus 11/19/2011   Low back pain 11/19/2011   Diffuse spasm of esophagus 11/11/2011   ESR raised 11/11/2011   Postinflammatory pulmonary fibrosis (HCC) 11/11/2011   Scleroderma (HCC) 11/16/2010   Rheumatoid arthritis (HCC) 11/16/2010   Raynaud's disease 11/16/2010   Symptomatic menopausal or female climacteric states 11/16/2010   Past Surgical History:  Past Surgical History:  Procedure Laterality Date   ANKLE FRACTURE SURGERY Right    AV FISTULA PLACEMENT Left 06/28/2016   Procedure: left arm ARTERIOVENOUS (AV) FISTULA CREATION;  Surgeon: Larina Earthly, MD;  Location: [facility] OR;  Service: Vascular;  Laterality: Left;   BASCILIC VEIN TRANSPOSITION Left 09/27/2016  Procedure: LEFT UPPER ARM CEPHALIC VEIN TRANSPOSITION;  Surgeon: Larina Earthly, MD;  Location: [facility] OR;  Service: Vascular;  Laterality: Left;   BREAST BIOPSY     "? side"   CESAREAN SECTION  1994   CO2 LASER OF CERVIX     COLONOSCOPY W/ BIOPSIES  01/08/2008   INSERTION OF DIALYSIS CATHETER Right 06/28/2016   Procedure: INSERTION OF DIALYSIS CATHETER, right internal jugular;  Surgeon: Larina Earthly, MD;  Location: [facility] OR;  Service:  Vascular;  Laterality: Right;   MYOMECTOMY     NASAL ENDOSCOPY WITH EPISTAXIS CONTROL N/A 12/29/2019   Procedure: NASAL ENDOSCOPY WITH EPISTAXIS CONTROL;  Surgeon: Newman Pies, MD;  Location: [facility] OR;  Service: ENT;  Laterality: N/A;   PELVIC LAPAROSCOPY  2011   superficial thrombophlebitis Left 07-2014   Medications: Current Outpatient Medications on File Prior to Visit  Medication Sig Dispense Refill   albuterol (VENTOLIN HFA) 108 (90 Base) MCG/ACT inhaler Inhale 2 puffs into the lungs every 6 (six) hours as needed for wheezing or shortness of breath. 8 g 6   ambrisentan (LETAIRIS) 5 MG tablet Take 10 mg by mouth daily.     aspirin EC 81 MG tablet Take 1 tablet (81 mg total) by mouth daily. Swallow whole. 30 tablet 11   atorvastatin (LIPITOR) 80 MG tablet Take 1 tablet (80 mg total) by mouth daily. 90 tablet 3   azelastine (ASTELIN) 0.1 % nasal spray Place 1 spray into both nostrils 2 (two) times daily as needed for rhinitis. Use in each nostril as directed 30 mL 12   bisacodyl (DULCOLAX) 5 MG EC tablet Take 1 tablet (5 mg total) by mouth daily as needed for severe constipation. 10 tablet 0   calcium acetate (PHOSLO) 667 MG capsule Take by mouth.     camphor-menthol (SARNA) lotion Apply topically 2 (two) times daily. 222 mL 0   clonazePAM (KLONOPIN) 0.5 MG tablet Take 1 tablet (0.5 mg total) by mouth 2 (two) times daily as needed for anxiety. 30 tablet 0   co-enzyme Q-10 30 MG capsule Take 7 capsules (210 mg total) by mouth 3 (three) times daily. 1 capsule 1   cyclobenzaprine (FLEXERIL) 5 MG tablet Take 1 tablet (5 mg total) by mouth every 8 (eight) hours as needed for muscle spasms. 30 tablet 0   docusate sodium (COLACE) 100 MG capsule Take 2 capsules (200 mg total) by mouth daily. 60 capsule 0   famotidine (PEPCID) 20 MG tablet Take 1 tablet (20 mg total) by mouth daily as needed for heartburn or indigestion. (Patient taking differently: Take 40 mg by mouth 2 (two) times daily.) 30 tablet 0    fluticasone (FLONASE) 50 MCG/ACT nasal spray Place 1 spray into both nostrils daily for 7 days. 15.8 mL 0   folic acid (FOLVITE) 1 MG tablet Take 1 tablet (1 mg total) by mouth daily. 90 tablet 2   gabapentin (NEURONTIN) 100 MG capsule Take 1 capsule (100 mg total) by mouth 3 (three) times daily. Take 1 capsule in morning and two at night 90 capsule 0   hydrocerin (EUCERIN) CREA Apply 1 application topically 2 (two) times daily. To dry skin--avoid IV site 454 g 0   hydrocortisone (ANUSOL-HC) 2.5 % rectal cream Place 1 Application rectally 2 (two) times daily. 30 g 0   hydrOXYzine (ATARAX/VISTARIL) 25 MG tablet Take 25 mg by mouth every 8 (eight) hours as needed for itching.      levocetirizine (XYZAL) 5 MG tablet SMARTSIG:1 Tablet(s)  By Mouth Every Evening     lidocaine-prilocaine (EMLA) cream Apply 1 application topically as needed. Apply to toes as needed for pain 30 g 3   lubiprostone (AMITIZA) 8 MCG capsule Take 8 mcg by mouth 2 (two) times daily with a meal.     multivitamin (RENA-VIT) TABS tablet Take 1 tablet by mouth daily at 6 (six) AM. 30 tablet 0   oxymetazoline (AFRIN) 0.05 % nasal spray Place 1 spray into both nostrils 2 (two) times daily as needed for congestion. 30 mL 0   polyethylene glycol (MIRALAX / GLYCOLAX) 17 g packet Take 17 g by mouth 2 (two) times daily. 14 each 0   Selexipag (UPTRAVI) 800 MCG TABS Take 1 tablet (800 mcg total) by mouth in the morning and at bedtime. 60 tablet 0   sodium chloride (OCEAN) 0.65 % SOLN nasal spray Place 1 spray into both nostrils as needed for congestion. 44 mL 0   vitamin B-12 (CYANOCOBALAMIN) 100 MCG tablet Take 1 tablet (100 mcg total) by mouth daily. 90 tablet 1   WINREVAIR subcutaneous injection Inject into the skin every 21 ( twenty-one) days.     No current facility-administered medications on file prior to visit.   Allergies:  Allergies  Allergen Reactions   Other Anaphylaxis and Other (See Comments)    Do not use polyflux  membrane.  Use alternate Other reaction(s): heart racing   Nitrofuran Derivatives     Advised not to take   [facility]cl] Palpitations and Other (See Comments)    Unknown   Tape Rash and Other (See Comments)    Itch- unsure if it was paper or adhesive tape   Immunizations: Up to date  Review of Systems: Negative except as noted in the HPI  Family History  Problem Relation Age of Onset   Cerebral aneurysm Mother    Hypertension Mother    Diabetes Mother    Heart disease Father    Skin telangiectasia Maternal Aunt        possible telangiectasia, patient recalls reddish birthmarks   Heart disease Maternal Aunt    Diabetes Maternal Grandmother    Heart disease Paternal Grandfather    Cerebral palsy Cousin        1ST COUSIN?   Diabetes Paternal Grandmother    Heart attack Maternal Grandfather    Colon cancer Maternal Uncle        older than 63 yo at diagnosis   Cerebral palsy Other    Stomach cancer Paternal Uncle   Please see the genetic counselor note for additional information  Social History: Lives with spouse in [place]  Vitals: Weight: 122.1 lb Height: 5'5" SpO2: 82% (off oxygen)  Genetics Physical Exam:  Constitution: The patient is active and alert  Face: No abnormalities detected in: face, midface or shape    Coarse facial features: no coarse facies    Midfacial hypoplasia: no midfacial hypoplasia  Mouth: (comments: Multiple small dark-colored blanching lesions on tongue, palate, inner lip mucosa)  Cardiac:    Murmur: murmur (comments: Systolic murmur throughout precordium, 3/6)  Lungs: (comments: Decreased BS on right)  Abdomen: No abnormalities detected in: abdomen or appearance    Abnormal umbilicus: normal umbilicus    Diastasis recti: no diastasis recti    Distended abdomen: no distension    Hepatosplenomegaly: no hepatosplenomegaly    Umbilical hernia: no umbilical hernia  Neurological: No abnormalities detected in:  facial movement Hair, Nails, and Skin: (comments: Telangiectasias on fingers)  Extremities: No abnormalities  detected in: extremities    Asymmetric girth: symmetric girth    Contractures: no joint contractures    Limited range of motion: non-limited ROM   [place]aly Haldeman-Englert, MD [facility]/Genetics Date: 10/21/2022 Time: 1500   Total time spent: 65 Time spent includes face to face and non-face to face care for the patient on the date of this encounter (history and physical, genetic counseling, coordination of care, data gathering and/or documentation as outlined).

## 2022-10-22 ENCOUNTER — Encounter: Payer: Self-pay | Admitting: Medical Genetics

## 2022-10-22 NOTE — Patient Instructions (Signed)
HHT testing through [facility] - results expected in 1 month.  Thank you for allowing [place] to be a part of your care. Please let us know if there is anything else you need from [place].  [facility] Team

## 2022-10-23 DIAGNOSIS — Z992 Dependence on renal dialysis: Secondary | ICD-10-CM | POA: Diagnosis not present

## 2022-10-23 DIAGNOSIS — D631 Anemia in chronic kidney disease: Secondary | ICD-10-CM | POA: Diagnosis not present

## 2022-10-23 DIAGNOSIS — N186 End stage renal disease: Secondary | ICD-10-CM | POA: Diagnosis not present

## 2022-10-23 DIAGNOSIS — D509 Iron deficiency anemia, unspecified: Secondary | ICD-10-CM | POA: Diagnosis not present

## 2022-10-23 DIAGNOSIS — N2581 Secondary hyperparathyroidism of renal origin: Secondary | ICD-10-CM | POA: Diagnosis not present

## 2022-10-24 DIAGNOSIS — N63 Unspecified lump in unspecified breast: Secondary | ICD-10-CM | POA: Insufficient documentation

## 2022-10-26 DIAGNOSIS — D631 Anemia in chronic kidney disease: Secondary | ICD-10-CM | POA: Diagnosis not present

## 2022-10-26 DIAGNOSIS — N2581 Secondary hyperparathyroidism of renal origin: Secondary | ICD-10-CM | POA: Diagnosis not present

## 2022-10-26 DIAGNOSIS — D509 Iron deficiency anemia, unspecified: Secondary | ICD-10-CM | POA: Diagnosis not present

## 2022-10-26 DIAGNOSIS — N186 End stage renal disease: Secondary | ICD-10-CM | POA: Diagnosis not present

## 2022-10-26 DIAGNOSIS — Z992 Dependence on renal dialysis: Secondary | ICD-10-CM | POA: Diagnosis not present

## 2022-10-27 DIAGNOSIS — R0602 Shortness of breath: Secondary | ICD-10-CM | POA: Diagnosis not present

## 2022-10-27 DIAGNOSIS — M0579 Rheumatoid arthritis with rheumatoid factor of multiple sites without organ or systems involvement: Secondary | ICD-10-CM | POA: Diagnosis not present

## 2022-10-27 DIAGNOSIS — I082 Rheumatic disorders of both aortic and tricuspid valves: Secondary | ICD-10-CM | POA: Diagnosis not present

## 2022-10-27 DIAGNOSIS — I50812 Chronic right heart failure: Secondary | ICD-10-CM | POA: Diagnosis not present

## 2022-10-27 DIAGNOSIS — Z79899 Other long term (current) drug therapy: Secondary | ICD-10-CM | POA: Diagnosis not present

## 2022-10-27 DIAGNOSIS — R0609 Other forms of dyspnea: Secondary | ICD-10-CM | POA: Diagnosis not present

## 2022-10-27 DIAGNOSIS — I519 Heart disease, unspecified: Secondary | ICD-10-CM | POA: Diagnosis not present

## 2022-10-27 DIAGNOSIS — K7469 Other cirrhosis of liver: Secondary | ICD-10-CM | POA: Diagnosis not present

## 2022-10-27 DIAGNOSIS — M349 Systemic sclerosis, unspecified: Secondary | ICD-10-CM | POA: Diagnosis not present

## 2022-10-27 DIAGNOSIS — M359 Systemic involvement of connective tissue, unspecified: Secondary | ICD-10-CM | POA: Diagnosis not present

## 2022-10-27 DIAGNOSIS — I2721 Secondary pulmonary arterial hypertension: Secondary | ICD-10-CM | POA: Diagnosis not present

## 2022-10-27 DIAGNOSIS — N186 End stage renal disease: Secondary | ICD-10-CM | POA: Diagnosis not present

## 2022-10-28 DIAGNOSIS — D509 Iron deficiency anemia, unspecified: Secondary | ICD-10-CM | POA: Diagnosis not present

## 2022-10-28 DIAGNOSIS — Z992 Dependence on renal dialysis: Secondary | ICD-10-CM | POA: Diagnosis not present

## 2022-10-28 DIAGNOSIS — D631 Anemia in chronic kidney disease: Secondary | ICD-10-CM | POA: Diagnosis not present

## 2022-10-28 DIAGNOSIS — N2581 Secondary hyperparathyroidism of renal origin: Secondary | ICD-10-CM | POA: Diagnosis not present

## 2022-10-28 DIAGNOSIS — N186 End stage renal disease: Secondary | ICD-10-CM | POA: Diagnosis not present

## 2022-10-29 ENCOUNTER — Inpatient Hospital Stay: Payer: Medicare Other

## 2022-10-29 DIAGNOSIS — D72819 Decreased white blood cell count, unspecified: Secondary | ICD-10-CM | POA: Diagnosis not present

## 2022-10-29 DIAGNOSIS — D693 Immune thrombocytopenic purpura: Secondary | ICD-10-CM | POA: Diagnosis not present

## 2022-10-29 DIAGNOSIS — N186 End stage renal disease: Secondary | ICD-10-CM | POA: Diagnosis not present

## 2022-10-29 DIAGNOSIS — D631 Anemia in chronic kidney disease: Secondary | ICD-10-CM | POA: Diagnosis not present

## 2022-10-29 DIAGNOSIS — K7460 Unspecified cirrhosis of liver: Secondary | ICD-10-CM | POA: Diagnosis not present

## 2022-10-29 DIAGNOSIS — D649 Anemia, unspecified: Secondary | ICD-10-CM

## 2022-10-29 DIAGNOSIS — E538 Deficiency of other specified B group vitamins: Secondary | ICD-10-CM | POA: Diagnosis not present

## 2022-10-29 DIAGNOSIS — I120 Hypertensive chronic kidney disease with stage 5 chronic kidney disease or end stage renal disease: Secondary | ICD-10-CM | POA: Diagnosis not present

## 2022-10-29 DIAGNOSIS — Z992 Dependence on renal dialysis: Secondary | ICD-10-CM | POA: Diagnosis not present

## 2022-10-29 LAB — CMP (CANCER CENTER ONLY)
ALT: 12 U/L (ref 0–44)
AST: 17 U/L (ref 15–41)
Albumin: 3.6 g/dL (ref 3.5–5.0)
Alkaline Phosphatase: 116 U/L (ref 38–126)
Anion gap: 10 (ref 5–15)
BUN: 33 mg/dL — ABNORMAL HIGH (ref 8–23)
CO2: 33 mmol/L — ABNORMAL HIGH (ref 22–32)
Calcium: 9.2 mg/dL (ref 8.9–10.3)
Chloride: 96 mmol/L — ABNORMAL LOW (ref 98–111)
Creatinine: 6.71 mg/dL — ABNORMAL HIGH (ref 0.44–1.00)
GFR, Estimated: 6 mL/min — ABNORMAL LOW (ref >60)
Glucose, Bld: 81 mg/dL (ref 70–99)
Potassium: 4 mmol/L (ref 3.5–5.1)
Sodium: 139 mmol/L (ref 135–145)
Total Bilirubin: 0.4 mg/dL (ref 0.3–1.2)
Total Protein: 7.4 g/dL (ref 6.5–8.1)

## 2022-10-29 LAB — CBC WITH DIFFERENTIAL (CANCER CENTER ONLY)
Abs Immature Granulocytes: 0.03 10*3/uL (ref 0.00–0.07)
Basophils Absolute: 0 10*3/uL (ref 0.0–0.1)
Basophils Relative: 1 %
Eosinophils Absolute: 0.1 10*3/uL (ref 0.0–0.5)
Eosinophils Relative: 2 %
HCT: 33.6 % — ABNORMAL LOW (ref 36.0–46.0)
Hemoglobin: 10.6 g/dL — ABNORMAL LOW (ref 12.0–15.0)
Immature Granulocytes: 1 %
Lymphocytes Relative: 20 %
Lymphs Abs: 0.6 10*3/uL — ABNORMAL LOW (ref 0.7–4.0)
MCH: 28.3 pg (ref 26.0–34.0)
MCHC: 31.5 g/dL (ref 30.0–36.0)
MCV: 89.8 fL (ref 80.0–100.0)
Monocytes Absolute: 0.4 10*3/uL (ref 0.1–1.0)
Monocytes Relative: 11 %
Neutro Abs: 2.1 10*3/uL (ref 1.7–7.7)
Neutrophils Relative %: 65 %
Platelet Count: 147 10*3/uL — ABNORMAL LOW (ref 150–400)
RBC: 3.74 MIL/uL — ABNORMAL LOW (ref 3.87–5.11)
RDW: 18.4 % — ABNORMAL HIGH (ref 11.5–15.5)
WBC Count: 3.2 10*3/uL — ABNORMAL LOW (ref 4.0–10.5)
nRBC: 0.6 % — ABNORMAL HIGH (ref 0.0–0.2)

## 2022-10-30 DIAGNOSIS — D509 Iron deficiency anemia, unspecified: Secondary | ICD-10-CM | POA: Diagnosis not present

## 2022-10-30 DIAGNOSIS — N186 End stage renal disease: Secondary | ICD-10-CM | POA: Diagnosis not present

## 2022-10-30 DIAGNOSIS — D631 Anemia in chronic kidney disease: Secondary | ICD-10-CM | POA: Diagnosis not present

## 2022-10-30 DIAGNOSIS — N2581 Secondary hyperparathyroidism of renal origin: Secondary | ICD-10-CM | POA: Diagnosis not present

## 2022-10-30 DIAGNOSIS — Z992 Dependence on renal dialysis: Secondary | ICD-10-CM | POA: Diagnosis not present

## 2022-11-01 ENCOUNTER — Telehealth: Payer: Self-pay

## 2022-11-01 ENCOUNTER — Telehealth: Payer: Self-pay | Admitting: Genetic Counselor

## 2022-11-01 ENCOUNTER — Encounter: Payer: Self-pay | Admitting: Genetic Counselor

## 2022-11-01 ENCOUNTER — Telehealth: Payer: Self-pay | Admitting: Hematology and Oncology

## 2022-11-01 DIAGNOSIS — M349 Systemic sclerosis, unspecified: Secondary | ICD-10-CM | POA: Diagnosis not present

## 2022-11-01 DIAGNOSIS — C44222 Squamous cell carcinoma of skin of right ear and external auricular canal: Secondary | ICD-10-CM | POA: Diagnosis not present

## 2022-11-01 DIAGNOSIS — R600 Localized edema: Secondary | ICD-10-CM | POA: Insufficient documentation

## 2022-11-01 DIAGNOSIS — M0579 Rheumatoid arthritis with rheumatoid factor of multiple sites without organ or systems involvement: Secondary | ICD-10-CM | POA: Diagnosis not present

## 2022-11-01 DIAGNOSIS — M359 Systemic involvement of connective tissue, unspecified: Secondary | ICD-10-CM | POA: Diagnosis not present

## 2022-11-01 DIAGNOSIS — I2721 Secondary pulmonary arterial hypertension: Secondary | ICD-10-CM | POA: Diagnosis not present

## 2022-11-01 NOTE — Telephone Encounter (Signed)
Pt advised with VU and appts with Dr Leonides Schanz confirmed

## 2022-11-01 NOTE — Progress Notes (Signed)
Called Ms. Mcclam to discuss the results of her genetic testing.  To review, Ms. Vong was seen due to the presence of telangiectasias on her lips and tongue and a history of recurrent epistaxis, with concern being raised for Hereditary Hemorrhagic Telangiectasia.  The [facility] Hereditary Hemorrhagic Telangiectasia and Vascular Malformation Panel (6 genes) was ordered.  The [facility] Hereditary Hemorrhagic Telangiectasia and Vascular Malformation Panel (6 genes) was negative/normal.  At this time, we have not identified a genetic cause for Ms. Oncale's symptoms. Given these results and the relatively late onset of her symptoms, it is less likely that she has Hereditary Hemorrhagic Telangiectasia (HHT).  No changes to healthcare management, further genetic testing, or testing of other family members are recommended based on these results.  Ms. Dutter was encouraged to contact [facility] with any future questions or if there are major changes to her health history. The test report has been attached to the appropriate order and released to Ms. Beverely Pace.  Tilda Franco, MS [facility] Certified Genetic Counselor

## 2022-11-01 NOTE — Telephone Encounter (Signed)
-----   Message from Briant Cedar sent at 11/01/2022  7:00 AM EDT ----- Myriam Jacobson: Please notify patient that her anemia has improved back to her baseline >10.   Erie Noe: Can you schedule labs/follow up with Dr. Leonides Schanz in 8 weeks? ----- Message ----- From: Interface, Lab In [place] Sent: 10/29/2022   8:52 AM EDT To: Briant Cedar, PA-C

## 2022-11-02 DIAGNOSIS — D631 Anemia in chronic kidney disease: Secondary | ICD-10-CM | POA: Diagnosis not present

## 2022-11-02 DIAGNOSIS — Z992 Dependence on renal dialysis: Secondary | ICD-10-CM | POA: Diagnosis not present

## 2022-11-02 DIAGNOSIS — N186 End stage renal disease: Secondary | ICD-10-CM | POA: Diagnosis not present

## 2022-11-02 DIAGNOSIS — D509 Iron deficiency anemia, unspecified: Secondary | ICD-10-CM | POA: Diagnosis not present

## 2022-11-02 DIAGNOSIS — N2581 Secondary hyperparathyroidism of renal origin: Secondary | ICD-10-CM | POA: Diagnosis not present

## 2022-11-03 DIAGNOSIS — C44221 Squamous cell carcinoma of skin of unspecified ear and external auricular canal: Secondary | ICD-10-CM | POA: Diagnosis not present

## 2022-11-04 DIAGNOSIS — D509 Iron deficiency anemia, unspecified: Secondary | ICD-10-CM | POA: Diagnosis not present

## 2022-11-04 DIAGNOSIS — N2581 Secondary hyperparathyroidism of renal origin: Secondary | ICD-10-CM | POA: Diagnosis not present

## 2022-11-04 DIAGNOSIS — D631 Anemia in chronic kidney disease: Secondary | ICD-10-CM | POA: Diagnosis not present

## 2022-11-04 DIAGNOSIS — N186 End stage renal disease: Secondary | ICD-10-CM | POA: Diagnosis not present

## 2022-11-04 DIAGNOSIS — Z992 Dependence on renal dialysis: Secondary | ICD-10-CM | POA: Diagnosis not present

## 2022-11-06 DIAGNOSIS — N2581 Secondary hyperparathyroidism of renal origin: Secondary | ICD-10-CM | POA: Diagnosis not present

## 2022-11-06 DIAGNOSIS — N186 End stage renal disease: Secondary | ICD-10-CM | POA: Diagnosis not present

## 2022-11-06 DIAGNOSIS — D509 Iron deficiency anemia, unspecified: Secondary | ICD-10-CM | POA: Diagnosis not present

## 2022-11-06 DIAGNOSIS — D631 Anemia in chronic kidney disease: Secondary | ICD-10-CM | POA: Diagnosis not present

## 2022-11-06 DIAGNOSIS — Z992 Dependence on renal dialysis: Secondary | ICD-10-CM | POA: Diagnosis not present

## 2022-11-09 DIAGNOSIS — N2581 Secondary hyperparathyroidism of renal origin: Secondary | ICD-10-CM | POA: Diagnosis not present

## 2022-11-09 DIAGNOSIS — D631 Anemia in chronic kidney disease: Secondary | ICD-10-CM | POA: Diagnosis not present

## 2022-11-09 DIAGNOSIS — D509 Iron deficiency anemia, unspecified: Secondary | ICD-10-CM | POA: Diagnosis not present

## 2022-11-09 DIAGNOSIS — Z992 Dependence on renal dialysis: Secondary | ICD-10-CM | POA: Diagnosis not present

## 2022-11-09 DIAGNOSIS — N186 End stage renal disease: Secondary | ICD-10-CM | POA: Diagnosis not present

## 2022-11-10 DIAGNOSIS — C44221 Squamous cell carcinoma of skin of unspecified ear and external auricular canal: Secondary | ICD-10-CM | POA: Diagnosis not present

## 2022-11-10 DIAGNOSIS — K118 Other diseases of salivary glands: Secondary | ICD-10-CM | POA: Diagnosis not present

## 2022-11-11 DIAGNOSIS — Z992 Dependence on renal dialysis: Secondary | ICD-10-CM | POA: Diagnosis not present

## 2022-11-11 DIAGNOSIS — N186 End stage renal disease: Secondary | ICD-10-CM | POA: Diagnosis not present

## 2022-11-11 DIAGNOSIS — N269 Renal sclerosis, unspecified: Secondary | ICD-10-CM | POA: Diagnosis not present

## 2022-11-11 DIAGNOSIS — D631 Anemia in chronic kidney disease: Secondary | ICD-10-CM | POA: Diagnosis not present

## 2022-11-11 DIAGNOSIS — N2581 Secondary hyperparathyroidism of renal origin: Secondary | ICD-10-CM | POA: Diagnosis not present

## 2022-11-11 DIAGNOSIS — D509 Iron deficiency anemia, unspecified: Secondary | ICD-10-CM | POA: Diagnosis not present

## 2022-11-13 DIAGNOSIS — Z992 Dependence on renal dialysis: Secondary | ICD-10-CM | POA: Diagnosis not present

## 2022-11-13 DIAGNOSIS — D631 Anemia in chronic kidney disease: Secondary | ICD-10-CM | POA: Diagnosis not present

## 2022-11-13 DIAGNOSIS — N186 End stage renal disease: Secondary | ICD-10-CM | POA: Diagnosis not present

## 2022-11-13 DIAGNOSIS — N2581 Secondary hyperparathyroidism of renal origin: Secondary | ICD-10-CM | POA: Diagnosis not present

## 2022-11-13 DIAGNOSIS — D509 Iron deficiency anemia, unspecified: Secondary | ICD-10-CM | POA: Diagnosis not present

## 2022-11-15 DIAGNOSIS — L658 Other specified nonscarring hair loss: Secondary | ICD-10-CM | POA: Diagnosis not present

## 2022-11-15 DIAGNOSIS — L853 Xerosis cutis: Secondary | ICD-10-CM | POA: Diagnosis not present

## 2022-11-15 DIAGNOSIS — L219 Seborrheic dermatitis, unspecified: Secondary | ICD-10-CM | POA: Diagnosis not present

## 2022-11-15 DIAGNOSIS — L299 Pruritus, unspecified: Secondary | ICD-10-CM | POA: Diagnosis not present

## 2022-11-16 DIAGNOSIS — Z992 Dependence on renal dialysis: Secondary | ICD-10-CM | POA: Diagnosis not present

## 2022-11-16 DIAGNOSIS — N2581 Secondary hyperparathyroidism of renal origin: Secondary | ICD-10-CM | POA: Diagnosis not present

## 2022-11-16 DIAGNOSIS — N186 End stage renal disease: Secondary | ICD-10-CM | POA: Diagnosis not present

## 2022-11-16 DIAGNOSIS — D631 Anemia in chronic kidney disease: Secondary | ICD-10-CM | POA: Diagnosis not present

## 2022-11-16 DIAGNOSIS — D509 Iron deficiency anemia, unspecified: Secondary | ICD-10-CM | POA: Diagnosis not present

## 2022-11-18 DIAGNOSIS — N186 End stage renal disease: Secondary | ICD-10-CM | POA: Diagnosis not present

## 2022-11-18 DIAGNOSIS — D509 Iron deficiency anemia, unspecified: Secondary | ICD-10-CM | POA: Diagnosis not present

## 2022-11-18 DIAGNOSIS — D631 Anemia in chronic kidney disease: Secondary | ICD-10-CM | POA: Diagnosis not present

## 2022-11-18 DIAGNOSIS — Z992 Dependence on renal dialysis: Secondary | ICD-10-CM | POA: Diagnosis not present

## 2022-11-18 DIAGNOSIS — N2581 Secondary hyperparathyroidism of renal origin: Secondary | ICD-10-CM | POA: Diagnosis not present

## 2022-11-20 DIAGNOSIS — D631 Anemia in chronic kidney disease: Secondary | ICD-10-CM | POA: Diagnosis not present

## 2022-11-20 DIAGNOSIS — Z992 Dependence on renal dialysis: Secondary | ICD-10-CM | POA: Diagnosis not present

## 2022-11-20 DIAGNOSIS — N2581 Secondary hyperparathyroidism of renal origin: Secondary | ICD-10-CM | POA: Diagnosis not present

## 2022-11-20 DIAGNOSIS — D509 Iron deficiency anemia, unspecified: Secondary | ICD-10-CM | POA: Diagnosis not present

## 2022-11-20 DIAGNOSIS — N186 End stage renal disease: Secondary | ICD-10-CM | POA: Diagnosis not present

## 2022-11-23 DIAGNOSIS — N186 End stage renal disease: Secondary | ICD-10-CM | POA: Diagnosis not present

## 2022-11-23 DIAGNOSIS — D509 Iron deficiency anemia, unspecified: Secondary | ICD-10-CM | POA: Diagnosis not present

## 2022-11-23 DIAGNOSIS — N2581 Secondary hyperparathyroidism of renal origin: Secondary | ICD-10-CM | POA: Diagnosis not present

## 2022-11-23 DIAGNOSIS — D631 Anemia in chronic kidney disease: Secondary | ICD-10-CM | POA: Diagnosis not present

## 2022-11-23 DIAGNOSIS — Z992 Dependence on renal dialysis: Secondary | ICD-10-CM | POA: Diagnosis not present

## 2022-11-24 DIAGNOSIS — M349 Systemic sclerosis, unspecified: Secondary | ICD-10-CM | POA: Diagnosis not present

## 2022-11-24 DIAGNOSIS — L570 Actinic keratosis: Secondary | ICD-10-CM | POA: Diagnosis not present

## 2022-11-24 DIAGNOSIS — K5902 Outlet dysfunction constipation: Secondary | ICD-10-CM | POA: Diagnosis not present

## 2022-11-24 DIAGNOSIS — C44222 Squamous cell carcinoma of skin of right ear and external auricular canal: Secondary | ICD-10-CM | POA: Diagnosis not present

## 2022-11-25 DIAGNOSIS — N2581 Secondary hyperparathyroidism of renal origin: Secondary | ICD-10-CM | POA: Diagnosis not present

## 2022-11-25 DIAGNOSIS — Z992 Dependence on renal dialysis: Secondary | ICD-10-CM | POA: Diagnosis not present

## 2022-11-25 DIAGNOSIS — D631 Anemia in chronic kidney disease: Secondary | ICD-10-CM | POA: Diagnosis not present

## 2022-11-25 DIAGNOSIS — N186 End stage renal disease: Secondary | ICD-10-CM | POA: Diagnosis not present

## 2022-11-25 DIAGNOSIS — D509 Iron deficiency anemia, unspecified: Secondary | ICD-10-CM | POA: Diagnosis not present

## 2022-11-27 DIAGNOSIS — N2581 Secondary hyperparathyroidism of renal origin: Secondary | ICD-10-CM | POA: Diagnosis not present

## 2022-11-27 DIAGNOSIS — D631 Anemia in chronic kidney disease: Secondary | ICD-10-CM | POA: Diagnosis not present

## 2022-11-27 DIAGNOSIS — Z992 Dependence on renal dialysis: Secondary | ICD-10-CM | POA: Diagnosis not present

## 2022-11-27 DIAGNOSIS — D509 Iron deficiency anemia, unspecified: Secondary | ICD-10-CM | POA: Diagnosis not present

## 2022-11-27 DIAGNOSIS — N186 End stage renal disease: Secondary | ICD-10-CM | POA: Diagnosis not present

## 2022-11-30 DIAGNOSIS — D509 Iron deficiency anemia, unspecified: Secondary | ICD-10-CM | POA: Diagnosis not present

## 2022-11-30 DIAGNOSIS — Z992 Dependence on renal dialysis: Secondary | ICD-10-CM | POA: Diagnosis not present

## 2022-11-30 DIAGNOSIS — N2581 Secondary hyperparathyroidism of renal origin: Secondary | ICD-10-CM | POA: Diagnosis not present

## 2022-11-30 DIAGNOSIS — D631 Anemia in chronic kidney disease: Secondary | ICD-10-CM | POA: Diagnosis not present

## 2022-11-30 DIAGNOSIS — N186 End stage renal disease: Secondary | ICD-10-CM | POA: Diagnosis not present

## 2022-12-02 DIAGNOSIS — D631 Anemia in chronic kidney disease: Secondary | ICD-10-CM | POA: Diagnosis not present

## 2022-12-02 DIAGNOSIS — Z992 Dependence on renal dialysis: Secondary | ICD-10-CM | POA: Diagnosis not present

## 2022-12-02 DIAGNOSIS — N186 End stage renal disease: Secondary | ICD-10-CM | POA: Diagnosis not present

## 2022-12-02 DIAGNOSIS — N2581 Secondary hyperparathyroidism of renal origin: Secondary | ICD-10-CM | POA: Diagnosis not present

## 2022-12-02 DIAGNOSIS — D509 Iron deficiency anemia, unspecified: Secondary | ICD-10-CM | POA: Diagnosis not present

## 2022-12-04 DIAGNOSIS — N186 End stage renal disease: Secondary | ICD-10-CM | POA: Diagnosis not present

## 2022-12-04 DIAGNOSIS — D509 Iron deficiency anemia, unspecified: Secondary | ICD-10-CM | POA: Diagnosis not present

## 2022-12-04 DIAGNOSIS — Z992 Dependence on renal dialysis: Secondary | ICD-10-CM | POA: Diagnosis not present

## 2022-12-04 DIAGNOSIS — N2581 Secondary hyperparathyroidism of renal origin: Secondary | ICD-10-CM | POA: Diagnosis not present

## 2022-12-04 DIAGNOSIS — D631 Anemia in chronic kidney disease: Secondary | ICD-10-CM | POA: Diagnosis not present

## 2022-12-06 DIAGNOSIS — Z992 Dependence on renal dialysis: Secondary | ICD-10-CM | POA: Diagnosis not present

## 2022-12-06 DIAGNOSIS — N2581 Secondary hyperparathyroidism of renal origin: Secondary | ICD-10-CM | POA: Diagnosis not present

## 2022-12-06 DIAGNOSIS — N186 End stage renal disease: Secondary | ICD-10-CM | POA: Diagnosis not present

## 2022-12-06 DIAGNOSIS — D631 Anemia in chronic kidney disease: Secondary | ICD-10-CM | POA: Diagnosis not present

## 2022-12-06 DIAGNOSIS — D509 Iron deficiency anemia, unspecified: Secondary | ICD-10-CM | POA: Diagnosis not present

## 2022-12-08 DIAGNOSIS — D631 Anemia in chronic kidney disease: Secondary | ICD-10-CM | POA: Diagnosis not present

## 2022-12-08 DIAGNOSIS — N186 End stage renal disease: Secondary | ICD-10-CM | POA: Diagnosis not present

## 2022-12-08 DIAGNOSIS — N2581 Secondary hyperparathyroidism of renal origin: Secondary | ICD-10-CM | POA: Diagnosis not present

## 2022-12-08 DIAGNOSIS — D509 Iron deficiency anemia, unspecified: Secondary | ICD-10-CM | POA: Diagnosis not present

## 2022-12-08 DIAGNOSIS — Z992 Dependence on renal dialysis: Secondary | ICD-10-CM | POA: Diagnosis not present

## 2022-12-11 DIAGNOSIS — N186 End stage renal disease: Secondary | ICD-10-CM | POA: Diagnosis not present

## 2022-12-11 DIAGNOSIS — N269 Renal sclerosis, unspecified: Secondary | ICD-10-CM | POA: Diagnosis not present

## 2022-12-11 DIAGNOSIS — D631 Anemia in chronic kidney disease: Secondary | ICD-10-CM | POA: Diagnosis not present

## 2022-12-11 DIAGNOSIS — Z992 Dependence on renal dialysis: Secondary | ICD-10-CM | POA: Diagnosis not present

## 2022-12-11 DIAGNOSIS — D509 Iron deficiency anemia, unspecified: Secondary | ICD-10-CM | POA: Diagnosis not present

## 2022-12-11 DIAGNOSIS — N2581 Secondary hyperparathyroidism of renal origin: Secondary | ICD-10-CM | POA: Diagnosis not present

## 2022-12-14 DIAGNOSIS — D509 Iron deficiency anemia, unspecified: Secondary | ICD-10-CM | POA: Diagnosis not present

## 2022-12-14 DIAGNOSIS — N186 End stage renal disease: Secondary | ICD-10-CM | POA: Diagnosis not present

## 2022-12-14 DIAGNOSIS — N2581 Secondary hyperparathyroidism of renal origin: Secondary | ICD-10-CM | POA: Diagnosis not present

## 2022-12-14 DIAGNOSIS — D631 Anemia in chronic kidney disease: Secondary | ICD-10-CM | POA: Diagnosis not present

## 2022-12-14 DIAGNOSIS — Z992 Dependence on renal dialysis: Secondary | ICD-10-CM | POA: Diagnosis not present

## 2022-12-15 DIAGNOSIS — Z79899 Other long term (current) drug therapy: Secondary | ICD-10-CM | POA: Diagnosis not present

## 2022-12-15 DIAGNOSIS — I679 Cerebrovascular disease, unspecified: Secondary | ICD-10-CM | POA: Diagnosis not present

## 2022-12-15 DIAGNOSIS — M3500 Sicca syndrome, unspecified: Secondary | ICD-10-CM | POA: Diagnosis not present

## 2022-12-15 DIAGNOSIS — K7460 Unspecified cirrhosis of liver: Secondary | ICD-10-CM | POA: Diagnosis not present

## 2022-12-15 DIAGNOSIS — D649 Anemia, unspecified: Secondary | ICD-10-CM | POA: Diagnosis not present

## 2022-12-15 DIAGNOSIS — Z8673 Personal history of transient ischemic attack (TIA), and cerebral infarction without residual deficits: Secondary | ICD-10-CM | POA: Diagnosis not present

## 2022-12-15 DIAGNOSIS — K219 Gastro-esophageal reflux disease without esophagitis: Secondary | ICD-10-CM | POA: Diagnosis not present

## 2022-12-15 DIAGNOSIS — N186 End stage renal disease: Secondary | ICD-10-CM | POA: Diagnosis not present

## 2022-12-15 DIAGNOSIS — M349 Systemic sclerosis, unspecified: Secondary | ICD-10-CM | POA: Diagnosis not present

## 2022-12-15 DIAGNOSIS — M0579 Rheumatoid arthritis with rheumatoid factor of multiple sites without organ or systems involvement: Secondary | ICD-10-CM | POA: Diagnosis not present

## 2022-12-15 DIAGNOSIS — I132 Hypertensive heart and chronic kidney disease with heart failure and with stage 5 chronic kidney disease, or end stage renal disease: Secondary | ICD-10-CM | POA: Diagnosis not present

## 2022-12-15 DIAGNOSIS — I471 Supraventricular tachycardia, unspecified: Secondary | ICD-10-CM | POA: Diagnosis not present

## 2022-12-15 DIAGNOSIS — I120 Hypertensive chronic kidney disease with stage 5 chronic kidney disease or end stage renal disease: Secondary | ICD-10-CM | POA: Diagnosis not present

## 2022-12-15 DIAGNOSIS — I4891 Unspecified atrial fibrillation: Secondary | ICD-10-CM | POA: Diagnosis not present

## 2022-12-15 DIAGNOSIS — I7300 Raynaud's syndrome without gangrene: Secondary | ICD-10-CM | POA: Diagnosis not present

## 2022-12-15 DIAGNOSIS — I2721 Secondary pulmonary arterial hypertension: Secondary | ICD-10-CM | POA: Diagnosis not present

## 2022-12-15 DIAGNOSIS — I8500 Esophageal varices without bleeding: Secondary | ICD-10-CM | POA: Diagnosis not present

## 2022-12-15 DIAGNOSIS — L570 Actinic keratosis: Secondary | ICD-10-CM | POA: Diagnosis not present

## 2022-12-15 DIAGNOSIS — I50812 Chronic right heart failure: Secondary | ICD-10-CM | POA: Diagnosis not present

## 2022-12-15 DIAGNOSIS — C44222 Squamous cell carcinoma of skin of right ear and external auricular canal: Secondary | ICD-10-CM | POA: Diagnosis not present

## 2022-12-15 DIAGNOSIS — C44221 Squamous cell carcinoma of skin of unspecified ear and external auricular canal: Secondary | ICD-10-CM | POA: Diagnosis not present

## 2022-12-15 DIAGNOSIS — Z992 Dependence on renal dialysis: Secondary | ICD-10-CM | POA: Diagnosis not present

## 2022-12-15 DIAGNOSIS — D631 Anemia in chronic kidney disease: Secondary | ICD-10-CM | POA: Diagnosis not present

## 2022-12-16 DIAGNOSIS — N186 End stage renal disease: Secondary | ICD-10-CM | POA: Diagnosis not present

## 2022-12-16 DIAGNOSIS — N2581 Secondary hyperparathyroidism of renal origin: Secondary | ICD-10-CM | POA: Diagnosis not present

## 2022-12-16 DIAGNOSIS — Z992 Dependence on renal dialysis: Secondary | ICD-10-CM | POA: Diagnosis not present

## 2022-12-16 DIAGNOSIS — D631 Anemia in chronic kidney disease: Secondary | ICD-10-CM | POA: Diagnosis not present

## 2022-12-16 DIAGNOSIS — D509 Iron deficiency anemia, unspecified: Secondary | ICD-10-CM | POA: Diagnosis not present

## 2022-12-18 DIAGNOSIS — N2581 Secondary hyperparathyroidism of renal origin: Secondary | ICD-10-CM | POA: Diagnosis not present

## 2022-12-18 DIAGNOSIS — Z992 Dependence on renal dialysis: Secondary | ICD-10-CM | POA: Diagnosis not present

## 2022-12-18 DIAGNOSIS — D631 Anemia in chronic kidney disease: Secondary | ICD-10-CM | POA: Diagnosis not present

## 2022-12-18 DIAGNOSIS — D509 Iron deficiency anemia, unspecified: Secondary | ICD-10-CM | POA: Diagnosis not present

## 2022-12-18 DIAGNOSIS — N186 End stage renal disease: Secondary | ICD-10-CM | POA: Diagnosis not present

## 2022-12-21 DIAGNOSIS — D509 Iron deficiency anemia, unspecified: Secondary | ICD-10-CM | POA: Diagnosis not present

## 2022-12-21 DIAGNOSIS — D631 Anemia in chronic kidney disease: Secondary | ICD-10-CM | POA: Diagnosis not present

## 2022-12-21 DIAGNOSIS — Z992 Dependence on renal dialysis: Secondary | ICD-10-CM | POA: Diagnosis not present

## 2022-12-21 DIAGNOSIS — N186 End stage renal disease: Secondary | ICD-10-CM | POA: Diagnosis not present

## 2022-12-21 DIAGNOSIS — N2581 Secondary hyperparathyroidism of renal origin: Secondary | ICD-10-CM | POA: Diagnosis not present

## 2022-12-23 ENCOUNTER — Telehealth: Payer: Medicare Other | Admitting: Emergency Medicine

## 2022-12-23 DIAGNOSIS — N2581 Secondary hyperparathyroidism of renal origin: Secondary | ICD-10-CM | POA: Diagnosis not present

## 2022-12-23 DIAGNOSIS — D509 Iron deficiency anemia, unspecified: Secondary | ICD-10-CM | POA: Diagnosis not present

## 2022-12-23 DIAGNOSIS — D631 Anemia in chronic kidney disease: Secondary | ICD-10-CM | POA: Diagnosis not present

## 2022-12-23 DIAGNOSIS — Z992 Dependence on renal dialysis: Secondary | ICD-10-CM | POA: Diagnosis not present

## 2022-12-23 DIAGNOSIS — N186 End stage renal disease: Secondary | ICD-10-CM | POA: Diagnosis not present

## 2022-12-24 ENCOUNTER — Telehealth: Payer: Medicare Other | Admitting: Emergency Medicine

## 2022-12-25 DIAGNOSIS — N186 End stage renal disease: Secondary | ICD-10-CM | POA: Diagnosis not present

## 2022-12-25 DIAGNOSIS — D509 Iron deficiency anemia, unspecified: Secondary | ICD-10-CM | POA: Diagnosis not present

## 2022-12-25 DIAGNOSIS — N2581 Secondary hyperparathyroidism of renal origin: Secondary | ICD-10-CM | POA: Diagnosis not present

## 2022-12-25 DIAGNOSIS — D631 Anemia in chronic kidney disease: Secondary | ICD-10-CM | POA: Diagnosis not present

## 2022-12-25 DIAGNOSIS — Z992 Dependence on renal dialysis: Secondary | ICD-10-CM | POA: Diagnosis not present

## 2022-12-27 DIAGNOSIS — I781 Nevus, non-neoplastic: Secondary | ICD-10-CM | POA: Diagnosis not present

## 2022-12-27 DIAGNOSIS — C44221 Squamous cell carcinoma of skin of unspecified ear and external auricular canal: Secondary | ICD-10-CM | POA: Diagnosis not present

## 2022-12-28 DIAGNOSIS — Z992 Dependence on renal dialysis: Secondary | ICD-10-CM | POA: Diagnosis not present

## 2022-12-28 DIAGNOSIS — N186 End stage renal disease: Secondary | ICD-10-CM | POA: Diagnosis not present

## 2022-12-28 DIAGNOSIS — D509 Iron deficiency anemia, unspecified: Secondary | ICD-10-CM | POA: Diagnosis not present

## 2022-12-28 DIAGNOSIS — D631 Anemia in chronic kidney disease: Secondary | ICD-10-CM | POA: Diagnosis not present

## 2022-12-28 DIAGNOSIS — N2581 Secondary hyperparathyroidism of renal origin: Secondary | ICD-10-CM | POA: Diagnosis not present

## 2022-12-29 ENCOUNTER — Inpatient Hospital Stay: Payer: Medicare Other | Attending: Hematology and Oncology

## 2022-12-29 ENCOUNTER — Other Ambulatory Visit: Payer: Self-pay | Admitting: Hematology and Oncology

## 2022-12-29 ENCOUNTER — Inpatient Hospital Stay (HOSPITAL_BASED_OUTPATIENT_CLINIC_OR_DEPARTMENT_OTHER): Payer: Medicare Other | Admitting: Hematology and Oncology

## 2022-12-29 VITALS — BP 107/61 | HR 77 | Temp 98.0°F | Resp 16 | Ht 65.0 in | Wt 129.0 lb

## 2022-12-29 DIAGNOSIS — E538 Deficiency of other specified B group vitamins: Secondary | ICD-10-CM | POA: Insufficient documentation

## 2022-12-29 DIAGNOSIS — D693 Immune thrombocytopenic purpura: Secondary | ICD-10-CM | POA: Diagnosis not present

## 2022-12-29 DIAGNOSIS — Z992 Dependence on renal dialysis: Secondary | ICD-10-CM | POA: Insufficient documentation

## 2022-12-29 DIAGNOSIS — D72819 Decreased white blood cell count, unspecified: Secondary | ICD-10-CM | POA: Insufficient documentation

## 2022-12-29 DIAGNOSIS — D649 Anemia, unspecified: Secondary | ICD-10-CM | POA: Diagnosis not present

## 2022-12-29 DIAGNOSIS — N186 End stage renal disease: Secondary | ICD-10-CM | POA: Diagnosis not present

## 2022-12-29 DIAGNOSIS — I120 Hypertensive chronic kidney disease with stage 5 chronic kidney disease or end stage renal disease: Secondary | ICD-10-CM | POA: Diagnosis not present

## 2022-12-29 DIAGNOSIS — K7460 Unspecified cirrhosis of liver: Secondary | ICD-10-CM | POA: Diagnosis not present

## 2022-12-29 DIAGNOSIS — D631 Anemia in chronic kidney disease: Secondary | ICD-10-CM | POA: Insufficient documentation

## 2022-12-29 LAB — IRON AND IRON BINDING CAPACITY (CC-WL,HP ONLY)
Iron: 85 ug/dL (ref 28–170)
Saturation Ratios: 28 % (ref 10.4–31.8)
TIBC: 304 ug/dL (ref 250–450)
UIBC: 219 ug/dL (ref 148–442)

## 2022-12-29 LAB — CBC WITH DIFFERENTIAL (CANCER CENTER ONLY)
Abs Immature Granulocytes: 0.01 10*3/uL (ref 0.00–0.07)
Basophils Absolute: 0 10*3/uL (ref 0.0–0.1)
Basophils Relative: 1 %
Eosinophils Absolute: 0.1 10*3/uL (ref 0.0–0.5)
Eosinophils Relative: 2 %
HCT: 28.3 % — ABNORMAL LOW (ref 36.0–46.0)
Hemoglobin: 8.8 g/dL — ABNORMAL LOW (ref 12.0–15.0)
Immature Granulocytes: 0 %
Lymphocytes Relative: 24 %
Lymphs Abs: 0.7 10*3/uL (ref 0.7–4.0)
MCH: 28.7 pg (ref 26.0–34.0)
MCHC: 31.1 g/dL (ref 30.0–36.0)
MCV: 92.2 fL (ref 80.0–100.0)
Monocytes Absolute: 0.4 10*3/uL (ref 0.1–1.0)
Monocytes Relative: 14 %
Neutro Abs: 1.7 10*3/uL (ref 1.7–7.7)
Neutrophils Relative %: 59 %
Platelet Count: 145 10*3/uL — ABNORMAL LOW (ref 150–400)
RBC: 3.07 MIL/uL — ABNORMAL LOW (ref 3.87–5.11)
RDW: 16.7 % — ABNORMAL HIGH (ref 11.5–15.5)
WBC Count: 2.9 10*3/uL — ABNORMAL LOW (ref 4.0–10.5)
nRBC: 0 % (ref 0.0–0.2)

## 2022-12-29 LAB — RETIC PANEL
Immature Retic Fract: 15.9 % (ref 2.3–15.9)
RBC.: 2.98 MIL/uL — ABNORMAL LOW (ref 3.87–5.11)
Retic Count, Absolute: 40.2 10*3/uL (ref 19.0–186.0)
Retic Ct Pct: 1.4 % (ref 0.4–3.1)
Reticulocyte Hemoglobin: 31.2 pg (ref >27.9)

## 2022-12-29 LAB — CMP (CANCER CENTER ONLY)
ALT: 8 U/L (ref 0–44)
AST: 19 U/L (ref 15–41)
Albumin: 3.7 g/dL (ref 3.5–5.0)
Alkaline Phosphatase: 130 U/L — ABNORMAL HIGH (ref 38–126)
Anion gap: 9 (ref 5–15)
BUN: 35 mg/dL — ABNORMAL HIGH (ref 8–23)
CO2: 33 mmol/L — ABNORMAL HIGH (ref 22–32)
Calcium: 9.7 mg/dL (ref 8.9–10.3)
Chloride: 95 mmol/L — ABNORMAL LOW (ref 98–111)
Creatinine: 6.55 mg/dL — ABNORMAL HIGH (ref 0.44–1.00)
GFR, Estimated: 7 mL/min — ABNORMAL LOW (ref >60)
Glucose, Bld: 83 mg/dL (ref 70–99)
Potassium: 4.4 mmol/L (ref 3.5–5.1)
Sodium: 137 mmol/L (ref 135–145)
Total Bilirubin: 0.4 mg/dL (ref <1.2)
Total Protein: 7.2 g/dL (ref 6.5–8.1)

## 2022-12-29 LAB — FERRITIN: Ferritin: 365 ng/mL — ABNORMAL HIGH (ref 11–307)

## 2022-12-29 NOTE — Progress Notes (Signed)
[facility] [phone]   [phone]  PROGRESS NOTE  Patient Care Team: Merri Brunette, MD as PCP - General Cyndie Chime Genene Churn, MD as Consulting Physician (Oncology) Early, Kristen Loader, MD (Inactive) as Consulting Physician (Vascular Surgery) [facility], Daphine Deutscher, MD as Consulting Physician (Nephrology) Enedina Finner, MD as Referring Physician (Internal Medicine) Marlane Mingle, MD as Consulting Physician (Cardiology)  Hematological/Oncological History # ITP # Anemia in Setting of ESRD on Dialysis # Leukopenia in Setting of Cirrhosis 05/2017: treated with steroid taper and rituximab with Dr. Cyndie Chime 11/18/2021: last visit with Dr. Clelia Croft 04/12/2022: transfer care to Dr. Leonides Schanz   Interval History:  Katie Nunez 67 y.o. female with medical history significant for ITP who presents for a follow up visit. The patient's last visit was 10/15/2022. In the interim since the last visit she has had concern for a squamous cell carcinoma of the ear which was subsequently evaluated at [facility] and found to be nonmalignant.  On exam today Katie Nunez reports she has been having some trouble with low energy and feeling fatigued.  Her hemoglobin levels have been dropping but reports that her EPO shots were held by her neurologist due to concern for the [facility] of the ear.  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  When diagnosed he went to [facility] where repeat biopsy showed no evidence of malignancy.  There is no evidence of cancer.  She reports that her Depo shots will restart.  Her hemoglobin has dropped today down to 8.8 but had previously been on target above 10 while on Cipro therapy.  She notes no other major changes in her health and she is eating a regular diet.  She is not having any trouble with bleeding, bruising, or dark stools.  She denies nausea, vomiting  or abdominal pain. She has occasional episodes of constipation. Otherwise she denies any fevers, chills, sweats, chest pain or cough.  A full 10 point ROS is otherwise negative.  MEDICAL HISTORY:  Past Medical History:  Diagnosis Date   Achalasia    Anxiety    Dysplasia of cervix, low grade (CIN 1)    Environmental allergies    "all year long" (12/27/2016)   ESRD (end stage renal disease) on dialysis (HCC)    "TTS; [facility]" (12/27/2016)   Fibromyalgia    GERD (gastroesophageal reflux disease)    Gout    Hypertension    IBS (irritable bowel syndrome)    MVP (mitral valve prolapse)    RA (rheumatoid arthritis) (HCC)    FOLLOWED BY DR. SHANAHAN   Raynaud's disease    Scleroderma (HCC)    Seasonal allergies    Thrombocytopenia (HCC) 07/01/2016   Acute fall to 13,000 07/01/16   Tubular adenoma 01/08/2008   CECUM   Vitamin D deficiency     SURGICAL HISTORY: Past Surgical History:  Procedure Laterality Date   ANKLE FRACTURE SURGERY Right    AV FISTULA PLACEMENT Left 06/28/2016   Procedure: left arm ARTERIOVENOUS (AV) FISTULA CREATION;  Surgeon: Larina Earthly, MD;  Location: [facility] OR;  Service: Vascular;  Laterality: Left;   BASCILIC VEIN TRANSPOSITION Left 09/27/2016   Procedure: LEFT UPPER ARM CEPHALIC VEIN TRANSPOSITION;  Surgeon: Larina Earthly, MD;  Location: [facility] OR;  Service: Vascular;  Laterality: Left;   BREAST BIOPSY     "? side"   CESAREAN SECTION  1994   CO2 LASER OF CERVIX  COLONOSCOPY W/ BIOPSIES  01/08/2008   INSERTION OF DIALYSIS CATHETER Right 06/28/2016   Procedure: INSERTION OF DIALYSIS CATHETER, right internal jugular;  Surgeon: Larina Earthly, MD;  Location: [facility] OR;  Service: Vascular;  Laterality: Right;   MYOMECTOMY     NASAL ENDOSCOPY WITH EPISTAXIS CONTROL N/A 12/29/2019   Procedure: NASAL ENDOSCOPY WITH EPISTAXIS CONTROL;  Surgeon: Newman Pies, MD;  Location: [facility] OR;  Service: ENT;  Laterality: N/A;   PELVIC LAPAROSCOPY  2011   superficial thrombophlebitis Left  07-2014    SOCIAL HISTORY: Social History   Socioeconomic History   Marital status: Married    Spouse name: Not on file   Number of children: Not on file   Years of education: Not on file   Highest education level: Not on file  Occupational History   Not on file  Tobacco Use   Smoking status: Never   Smokeless tobacco: Never  Vaping Use   Vaping status: Never Used  Substance and Sexual Activity   Alcohol use: No   Drug use: No   Sexual activity: Not Currently    Birth control/protection: Post-menopausal  Other Topics Concern   Not on file  Social History Narrative   Lives in [place]   Social Drivers of Health   Financial Resource Strain: Not on file  Food Insecurity: Low Risk  (11/24/2022)   Received from [facility]   Hunger Vital Sign    Worried About Running Out of Food in the Last Year: Never true    Ran Out of Food in the Last Year: Never true  Transportation Needs: No Transportation Needs (11/24/2022)   Received from [facility]    In the past 12 months, has lack of reliable transportation kept you from medical appointments, meetings, work or from getting things needed for daily living? : No  Physical Activity: Not on file  Stress: Not on file  Social Connections: Unknown (05/22/2021)   [facility]   Social Network    Social Network: Not on file  Intimate Partner Violence: Not At Risk (06/24/2022)   Humiliation, Afraid, Rape, and Kick questionnaire    Fear of Current or Ex-Partner: No    Emotionally Abused: No    Physically Abused: No    Sexually Abused: No    FAMILY HISTORY: Family History  Problem Relation Age of Onset   Cerebral aneurysm Mother    Hypertension Mother    Diabetes Mother    Heart disease Father    Skin telangiectasia Maternal Aunt        possible telangiectasia, patient recalls reddish birthmarks   Heart disease Maternal Aunt    Diabetes Maternal Grandmother    Heart disease  Paternal Grandfather    Cerebral palsy Cousin        1ST COUSIN?   Diabetes Paternal Grandmother    Heart attack Maternal Grandfather    Colon cancer Maternal Uncle        older than 13 yo at diagnosis   Cerebral palsy Other    Stomach cancer Paternal Uncle     ALLERGIES:  is allergic to other, nitrofuran derivatives, savella [milnacipran hcl], and tape.  MEDICATIONS:  Current Outpatient Medications  Medication Sig Dispense Refill   albuterol (VENTOLIN HFA) 108 (90 Base) MCG/ACT inhaler Inhale 2 puffs into the lungs every 6 (six) hours as needed for wheezing or shortness of breath. 8 g 6   ambrisentan (LETAIRIS) 5 MG tablet Take 10 mg by  mouth daily.     aspirin EC 81 MG tablet Take 1 tablet (81 mg total) by mouth daily. Swallow whole. 30 tablet 11   atorvastatin (LIPITOR) 80 MG tablet Take 1 tablet (80 mg total) by mouth daily. 90 tablet 3   azelastine (ASTELIN) 0.1 % nasal spray Place 1 spray into both nostrils 2 (two) times daily as needed for rhinitis. Use in each nostril as directed 30 mL 12   bisacodyl (DULCOLAX) 5 MG EC tablet Take 1 tablet (5 mg total) by mouth daily as needed for severe constipation. 10 tablet 0   calcium acetate (PHOSLO) 667 MG capsule Take by mouth.     camphor-menthol (SARNA) lotion Apply topically 2 (two) times daily. 222 mL 0   clonazePAM (KLONOPIN) 0.5 MG tablet Take 1 tablet (0.5 mg total) by mouth 2 (two) times daily as needed for anxiety. 30 tablet 0   co-enzyme Q-10 30 MG capsule Take 7 capsules (210 mg total) by mouth 3 (three) times daily. 1 capsule 1   cyclobenzaprine (FLEXERIL) 5 MG tablet Take 1 tablet (5 mg total) by mouth every 8 (eight) hours as needed for muscle spasms. 30 tablet 0   docusate sodium (COLACE) 100 MG capsule Take 2 capsules (200 mg total) by mouth daily. 60 capsule 0   famotidine (PEPCID) 20 MG tablet Take 1 tablet (20 mg total) by mouth daily as needed for heartburn or indigestion. (Patient taking differently: Take 40 mg by  mouth 2 (two) times daily.) 30 tablet 0   fluticasone (FLONASE) 50 MCG/ACT nasal spray Place 1 spray into both nostrils daily for 7 days. 15.8 mL 0   folic acid (FOLVITE) 1 MG tablet Take 1 tablet (1 mg total) by mouth daily. 90 tablet 2   gabapentin (NEURONTIN) 100 MG capsule Take 1 capsule (100 mg total) by mouth 3 (three) times daily. Take 1 capsule in morning and two at night 90 capsule 0   hydrocerin (EUCERIN) CREA Apply 1 application topically 2 (two) times daily. To dry skin--avoid IV site 454 g 0   hydrocortisone (ANUSOL-HC) 2.5 % rectal cream Place 1 Application rectally 2 (two) times daily. 30 g 0   hydrOXYzine (ATARAX/VISTARIL) 25 MG tablet Take 25 mg by mouth every 8 (eight) hours as needed for itching.      levocetirizine (XYZAL) 5 MG tablet SMARTSIG:1 Tablet(s) By Mouth Every Evening     lidocaine-prilocaine (EMLA) cream Apply 1 application topically as needed. Apply to toes as needed for pain 30 g 3   lubiprostone (AMITIZA) 8 MCG capsule Take 8 mcg by mouth 2 (two) times daily with a meal.     multivitamin (RENA-VIT) TABS tablet Take 1 tablet by mouth daily at 6 (six) AM. 30 tablet 0   oxymetazoline (AFRIN) 0.05 % nasal spray Place 1 spray into both nostrils 2 (two) times daily as needed for congestion. 30 mL 0   polyethylene glycol (MIRALAX / GLYCOLAX) 17 g packet Take 17 g by mouth 2 (two) times daily. 14 each 0   Selexipag (UPTRAVI) 800 MCG TABS Take 1 tablet (800 mcg total) by mouth in the morning and at bedtime. 60 tablet 0   sodium chloride (OCEAN) 0.65 % SOLN nasal spray Place 1 spray into both nostrils as needed for congestion. 44 mL 0   vitamin B-12 (CYANOCOBALAMIN) 100 MCG tablet Take 1 tablet (100 mcg total) by mouth daily. 90 tablet 1   WINREVAIR subcutaneous injection Inject into the skin every 21 ( twenty-one) days.  No current facility-administered medications for this visit.    REVIEW OF SYSTEMS:   Constitutional: ( - ) fevers, ( - )  chills , ( - ) night  sweats Eyes: ( - ) blurriness of vision, ( - ) double vision, ( - ) watery eyes Ears, nose, mouth, throat, and face: ( - ) mucositis, ( - ) sore throat Respiratory: ( - ) cough, ( - ) dyspnea, ( - ) wheezes Cardiovascular: ( - ) palpitation, ( - ) chest discomfort, ( - ) lower extremity swelling Gastrointestinal:  ( - ) nausea, ( - ) heartburn, ( - ) change in bowel habits Skin: ( - ) abnormal skin rashes Lymphatics: ( - ) new lymphadenopathy, ( - ) easy bruising Neurological: ( - ) numbness, ( - ) tingling, ( - ) new weaknesses Behavioral/Psych: ( - ) mood change, ( - ) new changes  All other systems were reviewed with the patient and are negative.  PHYSICAL EXAMINATION: Vitals:   12/29/22 1004  BP: 107/61  Pulse: 77  Resp: 16  Temp: 98 F (36.7 C)  SpO2: 98%    Filed Weights   12/29/22 1004  Weight: 129 lb (58.5 kg)     GENERAL: Well-appearing middle-aged African-American female, alert, no distress and comfortable SKIN: skin color, texture, turgor are normal, no rashes or significant lesions EYES: conjunctiva are pink and non-injected, sclera clear LUNGS: clear to auscultation and percussion with normal breathing effort HEART: regular rate & rhythm and no murmurs and no lower extremity edema Musculoskeletal: no cyanosis of digits and no clubbing  PSYCH: alert & oriented x 3, fluent speech NEURO: no focal motor/sensory deficits  LABORATORY DATA:  I have reviewed the data as listed    Latest Ref Rng & Units 12/29/2022    9:36 AM 10/29/2022    8:34 AM 10/15/2022   10:53 AM  CBC  WBC 4.0 - 10.5 K/uL 2.9  3.2  3.5   Hemoglobin 12.0 - 15.0 g/dL 8.8  16.1  7.7   Hematocrit 36.0 - 46.0 % 28.3  33.6  24.5   Platelets 150 - 400 K/uL 145  147  184        Latest Ref Rng & Units 12/29/2022    9:36 AM 10/29/2022    8:34 AM 10/15/2022   10:53 AM  CMP  Glucose 70 - 99 mg/dL 83  81  96   BUN 8 - 23 mg/dL 35  33  40   Creatinine 0.44 - 1.00 mg/dL 0.96  0.45  4.09   Sodium  135 - 145 mmol/L 137  139  139   Potassium 3.5 - 5.1 mmol/L 4.4  4.0  3.9   Chloride 98 - 111 mmol/L 95  96  96   CO2 22 - 32 mmol/L 33  33  33   Calcium 8.9 - 10.3 mg/dL 9.7  9.2  9.4   Total Protein 6.5 - 8.1 g/dL 7.2  7.4  7.2   Total Bilirubin <1.2 mg/dL 0.4  0.4  0.4   Alkaline Phos 38 - 126 U/L 130  116  108   AST 15 - 41 U/L 19  17  19    ALT 0 - 44 U/L 8  12  11     RADIOGRAPHIC STUDIES: No results found.  ASSESSMENT & PLAN Katie Nunez 67 y.o. female with medical history significant for ITP who presents for a follow up visit.  # Normocytic anemia --Multifactorial 2/2 to ESRD, vitamin B12 and  folate deficiency. --Currently on PO vitamin B12 and folic acid supplementation --Workup from 04/12/2022 ruled out monoclonal gammopathy and iron deficiency. --Due to marked decline of anemia with Hgb 7.7 (previously 10.1), we will arrange for 1 unit of PRBC. B12 and folate levels have improved and back to normal range. Recommend to continue PO supplementation. Iron panel shows no deficiency -- Agree with patient's nephrologist restarting EPO shots with her dialysis.  Target hemoglobin greater than 10.0 -- Return to clinic in 3 months time to reevaluate and assure that her hemoglobin is stable.  If stable at that time could consider every 6 month visits to monitor her ITP.  # ITP -- Patient last required steroid therapy in May 2019. Also received rituximab at that time. -- Labs today show platelet count normal at 145K -- No clear indication for intervention at this time.  Would consider replacing steroid therapy in the event her platelets were to drop below 30.  # Leukopenia -- Stable over last several years, likely secondary to cirrhosis. -- Cirrhosis and ITP are likely reasons why platelets are mildly decreased.  Follow up: --3 months for clinic visit with interval EPO shots at dialysis per the recommendations of her nephrologist.  No orders of the defined types were placed in this  encounter.   All questions were answered. The patient knows to call the clinic with any problems, questions or concerns.  I have spent a total of 30 minutes minutes of face-to-face and non-face-to-face time, preparing to see the patient,  performing a medically appropriate examination, counseling and educating the patient, ordering medications/tests/procedures,documenting clinical information in the electronic health record, independently interpreting results and communicating results to the patient, and care coordination.   Ulysees Barns, MD Department of Hematology/Oncology [facility] at [facility] Phone: [phone] Pager: [phone] Email: [email or web address]   12/29/2022 4:43 PM

## 2022-12-30 DIAGNOSIS — N2581 Secondary hyperparathyroidism of renal origin: Secondary | ICD-10-CM | POA: Diagnosis not present

## 2022-12-30 DIAGNOSIS — D631 Anemia in chronic kidney disease: Secondary | ICD-10-CM | POA: Diagnosis not present

## 2022-12-30 DIAGNOSIS — N186 End stage renal disease: Secondary | ICD-10-CM | POA: Diagnosis not present

## 2022-12-30 DIAGNOSIS — D509 Iron deficiency anemia, unspecified: Secondary | ICD-10-CM | POA: Diagnosis not present

## 2022-12-30 DIAGNOSIS — Z992 Dependence on renal dialysis: Secondary | ICD-10-CM | POA: Diagnosis not present

## 2022-12-31 ENCOUNTER — Ambulatory Visit: Payer: Medicare Other | Admitting: Orthopaedic Surgery

## 2022-12-31 LAB — ERYTHROPOIETIN: Erythropoietin: 387.5 m[IU]/mL — ABNORMAL HIGH (ref 2.6–18.5)

## 2023-01-01 DIAGNOSIS — N186 End stage renal disease: Secondary | ICD-10-CM | POA: Diagnosis not present

## 2023-01-01 DIAGNOSIS — D631 Anemia in chronic kidney disease: Secondary | ICD-10-CM | POA: Diagnosis not present

## 2023-01-01 DIAGNOSIS — Z992 Dependence on renal dialysis: Secondary | ICD-10-CM | POA: Diagnosis not present

## 2023-01-01 DIAGNOSIS — N2581 Secondary hyperparathyroidism of renal origin: Secondary | ICD-10-CM | POA: Diagnosis not present

## 2023-01-01 DIAGNOSIS — D509 Iron deficiency anemia, unspecified: Secondary | ICD-10-CM | POA: Diagnosis not present

## 2023-01-03 DIAGNOSIS — N2581 Secondary hyperparathyroidism of renal origin: Secondary | ICD-10-CM | POA: Diagnosis not present

## 2023-01-03 DIAGNOSIS — Z992 Dependence on renal dialysis: Secondary | ICD-10-CM | POA: Diagnosis not present

## 2023-01-03 DIAGNOSIS — D509 Iron deficiency anemia, unspecified: Secondary | ICD-10-CM | POA: Diagnosis not present

## 2023-01-03 DIAGNOSIS — D631 Anemia in chronic kidney disease: Secondary | ICD-10-CM | POA: Diagnosis not present

## 2023-01-03 DIAGNOSIS — N186 End stage renal disease: Secondary | ICD-10-CM | POA: Diagnosis not present

## 2023-01-06 DIAGNOSIS — N186 End stage renal disease: Secondary | ICD-10-CM | POA: Diagnosis not present

## 2023-01-06 DIAGNOSIS — D631 Anemia in chronic kidney disease: Secondary | ICD-10-CM | POA: Diagnosis not present

## 2023-01-06 DIAGNOSIS — N2581 Secondary hyperparathyroidism of renal origin: Secondary | ICD-10-CM | POA: Diagnosis not present

## 2023-01-06 DIAGNOSIS — D509 Iron deficiency anemia, unspecified: Secondary | ICD-10-CM | POA: Diagnosis not present

## 2023-01-06 DIAGNOSIS — Z992 Dependence on renal dialysis: Secondary | ICD-10-CM | POA: Diagnosis not present

## 2023-01-08 DIAGNOSIS — Z992 Dependence on renal dialysis: Secondary | ICD-10-CM | POA: Diagnosis not present

## 2023-01-08 DIAGNOSIS — N2581 Secondary hyperparathyroidism of renal origin: Secondary | ICD-10-CM | POA: Diagnosis not present

## 2023-01-08 DIAGNOSIS — N186 End stage renal disease: Secondary | ICD-10-CM | POA: Diagnosis not present

## 2023-01-08 DIAGNOSIS — D509 Iron deficiency anemia, unspecified: Secondary | ICD-10-CM | POA: Diagnosis not present

## 2023-01-08 DIAGNOSIS — D631 Anemia in chronic kidney disease: Secondary | ICD-10-CM | POA: Diagnosis not present

## 2023-01-09 ENCOUNTER — Other Ambulatory Visit: Payer: Self-pay | Admitting: Hematology and Oncology

## 2023-01-10 ENCOUNTER — Encounter (HOSPITAL_COMMUNITY): Admission: RE | Payer: Self-pay | Source: Home / Self Care

## 2023-01-10 ENCOUNTER — Ambulatory Visit (HOSPITAL_COMMUNITY): Admission: RE | Admit: 2023-01-10 | Payer: Medicare Other | Source: Home / Self Care | Admitting: Nephrology

## 2023-01-10 DIAGNOSIS — D631 Anemia in chronic kidney disease: Secondary | ICD-10-CM | POA: Diagnosis not present

## 2023-01-10 DIAGNOSIS — N186 End stage renal disease: Secondary | ICD-10-CM | POA: Diagnosis not present

## 2023-01-10 DIAGNOSIS — D509 Iron deficiency anemia, unspecified: Secondary | ICD-10-CM | POA: Diagnosis not present

## 2023-01-10 DIAGNOSIS — Z992 Dependence on renal dialysis: Secondary | ICD-10-CM | POA: Diagnosis not present

## 2023-01-10 DIAGNOSIS — N2581 Secondary hyperparathyroidism of renal origin: Secondary | ICD-10-CM | POA: Diagnosis not present

## 2023-01-10 SURGERY — A/V FISTULAGRAM
Anesthesia: LOCAL

## 2023-01-11 ENCOUNTER — Ambulatory Visit: Payer: Medicare Other | Admitting: Orthopaedic Surgery

## 2023-01-11 DIAGNOSIS — N186 End stage renal disease: Secondary | ICD-10-CM | POA: Diagnosis not present

## 2023-01-11 DIAGNOSIS — M25511 Pain in right shoulder: Secondary | ICD-10-CM

## 2023-01-11 DIAGNOSIS — M7541 Impingement syndrome of right shoulder: Secondary | ICD-10-CM

## 2023-01-11 DIAGNOSIS — N269 Renal sclerosis, unspecified: Secondary | ICD-10-CM | POA: Diagnosis not present

## 2023-01-11 DIAGNOSIS — Z992 Dependence on renal dialysis: Secondary | ICD-10-CM | POA: Diagnosis not present

## 2023-01-11 NOTE — Progress Notes (Signed)
 Office Visit Note   Patient: Katie Nunez           Date of Birth: 1955/07/17           MRN: [id number] Visit Date: 01/11/2023              Requested by: Claudene Pellet, MD [phone] [address] [place],  [place] [address] PCP: Claudene Pellet, MD   Assessment & Plan: Visit Diagnoses:  1. Impingement syndrome of right shoulder     Plan: Subacromial injection performed with good relief.  She will let us  know if she has persistent problems.  Follow-Up Instructions: No follow-ups on file.   Orders:  No orders of the defined types were placed in this encounter.  No orders of the defined types were placed in this encounter.     Procedures: Large Joint Inj: R subacromial bursa on 01/11/2023 3:39 PM Indications: pain Details: 22 G 1.5 in needle  Arthrogram: No  Medications: 4 mL bupivacaine  0.25 %; 40 mg methylPREDNISolone  acetate 40 MG/ML; 0.5 mL lidocaine  1 % Outcome: tolerated well, no immediate complications Procedure, treatment alternatives, risks and benefits explained, specific risks discussed. Consent was given by the patient. Immediately prior to procedure a time out was called to verify the correct patient, procedure, equipment, support staff and site/side marked as required. Patient was prepped and draped in the usual sterile fashion.       Clinical Data: No additional findings.   Subjective: Chief Complaint  Patient presents with   Neck - Pain   Right Ankle - Pain    HPI 67 year old female with rheumatoid arthritis history of bilateral carpal tunnel syndrome chronic gout with tophi in the past, chronic renal failure on dialysis presents with painful right shoulder difficulty with outstretched reaching or overhead activities.  Previous ankle fracture she still has some swelling in the right ankle not particularly painful with ambulation.  She has used Tylenol  for the shoulder but states shoulders progressively got more painful.  Previous ankle  surgery was done by Dr. Harden and x-rays show well-healed ankle fracture.  She did have some increased callus at the fracture site suggesting that she may have had delayed union.  No drainage increased warmth to the incision on her ankle.  Review of Systems all systems noncontributory to HPI.   Objective: Vital Signs: LMP 11/15/2008   Physical Exam Constitutional:      Appearance: She is well-developed.  HENT:     Head: Normocephalic.     Right Ear: External ear normal.     Left Ear: External ear normal. There is no impacted cerumen.  Eyes:     Pupils: Pupils are equal, round, and reactive to light.  Neck:     Thyroid : No thyromegaly.     Trachea: No tracheal deviation.  Cardiovascular:     Rate and Rhythm: Normal rate.  Pulmonary:     Effort: Pulmonary effort is normal.  Abdominal:     Palpations: Abdomen is soft.  Musculoskeletal:     Cervical back: No rigidity.  Skin:    General: Skin is warm and dry.  Neurological:     Mental Status: She is alert and oriented to person, place, and time.  Psychiatric:        Behavior: Behavior normal.     Ortho Exam-positive impingement right shoulder long head of the biceps is intact good cervical range of motion.  Left ankle has trace swelling which tends to fluctuate with her dialysis.  Incisions are well-healed medial and lateral no cellulitis.  Specialty Comments:  No specialty comments available.  Imaging: No results found.   PMFS History: Patient Active Problem List   Diagnosis Date Noted   Impingement syndrome of right shoulder 01/11/2023   Pain due to onychomycosis of toenails of both feet 10/13/2022   Colitis 06/25/2022   Elevated troponin 06/25/2022   Prolonged QT interval 06/25/2022   Chronic diastolic CHF (congestive heart failure) (HCC) 06/25/2022   GERD (gastroesophageal reflux disease) 06/25/2022   GAD (generalized anxiety disorder) 06/25/2022   Hyperlipidemia 06/25/2022   Atypical chest pain 06/24/2022    Allergic rhinitis 04/07/2022   Ankle swelling 11/26/2021   Neck pain 10/30/2020   Knee locking, left 10/05/2020   Abnormality of gait 02/25/2020   Protein-calorie malnutrition, severe 01/19/2020   Epistaxis    Sleep disturbance    Slow transit constipation    Hemorrhoids    Chronic systolic congestive heart failure (HCC)    End-stage renal disease on hemodialysis (HCC)    Right hemiparesis (HCC)    Pressure injury of skin 12/20/2019   Intraparenchymal hemorrhage of brain (HCC) 12/18/2019   ICH (intracerebral hemorrhage) (HCC) 12/12/2019   Viral disease 08/22/2019   Chronic right-sided heart failure (HCC) 07/09/2019   Supraventricular tachycardia (HCC) 07/09/2019   Other cirrhosis of liver (HCC) 06/27/2019   Heart failure (HCC) 02/21/2019   Heart palpitations 08/14/2018   Other fluid overload 07/27/2018   Encounter for long-term (current) use of other medications 01/18/2018   Hypothyroidism 01/18/2018   Myalgia and myositis 01/18/2018   Chronic nephritis 01/18/2018   Other long term (current) drug therapy 01/18/2018   Sleep apnea 01/18/2018   Unspecified persistent mental disorders due to conditions classified elsewhere 01/18/2018   Venous reflux 01/18/2018   Vitamin B12 deficiency 01/18/2018   Vitamin D  deficiency 01/18/2018   Obstructive lung disease (HCC) 12/30/2017   Other pruritus 12/27/2017   Eruption cyst 12/19/2017   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 (HCC) 11/28/2017   Chronic cough 11/11/2017   Pulmonary hypertension (HCC) 11/11/2017   Pulmonary arterial hypertension (HCC) 10/07/2017   Acute ITP (HCC) 01/05/2017   ESRD (end stage renal disease) (HCC) 12/28/2016   Encounter for removal of sutures 09/09/2016   Coagulation defect, unspecified (HCC) 09/01/2016   Underimmunization status 08/05/2016   Hemolytic anemia (HCC) 07/26/2016   Epistaxis, recurrent 07/26/2016   Unspecified protein-calorie malnutrition (HCC) 07/13/2016    Aftercare including intermittent dialysis (HCC) 07/07/2016   Anemia in chronic kidney disease 07/07/2016   Hypokalemia 07/07/2016   Iron  deficiency anemia, unspecified 07/07/2016   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   Nonrheumatic mitral (valve) prolapse 07/07/2016   Irritable bowel syndrome 07/07/2016   Other secondary thrombocytopenia 07/07/2016   Secondary hyperparathyroidism of renal origin (HCC) 07/07/2016   Thrombocytopenia (HCC) 07/01/2016   ARF (acute renal failure) (HCC) 06/25/2016   Anxiety 06/25/2016   Bilateral carpal tunnel syndrome 10/22/2015   Chronic gout without tophus 10/22/2015   Chronic nonintractable headache 10/08/2015   Fibroid uterus 01/03/2012   H/O vitamin D  deficiency 01/03/2012   Post-menopausal 01/03/2012   Hereditary and idiopathic peripheral neuropathy 11/19/2011   Intestinal malabsorption 11/19/2011   Lichen planus 11/19/2011   Low back pain 11/19/2011   Diffuse spasm of esophagus 11/11/2011   ESR raised 11/11/2011   Postinflammatory pulmonary fibrosis (HCC) 11/11/2011   Scleroderma (HCC) 11/16/2010   Rheumatoid arthritis (HCC) 11/16/2010   Raynaud's disease 11/16/2010   Symptomatic menopausal or female climacteric states 11/16/2010   Past  Medical History:  Diagnosis Date   Achalasia    Anxiety    Dysplasia of cervix, low grade (CIN 1)    Environmental allergies    all year long (12/27/2016)   ESRD (end stage renal disease) on dialysis (HCC)    TTS; [facility] (12/27/2016)   Fibromyalgia    GERD (gastroesophageal reflux disease)    Gout    Hypertension    IBS (irritable bowel syndrome)    MVP (mitral valve prolapse)    RA (rheumatoid arthritis) (HCC)    FOLLOWED BY DR. SHANAHAN   Raynaud's disease    Scleroderma (HCC)    Seasonal allergies    Thrombocytopenia (HCC) 07/01/2016   Acute fall to 13,000 07/01/16   Tubular adenoma 01/08/2008   CECUM   Vitamin D  deficiency     Family History  Problem Relation Age of Onset   Cerebral  aneurysm Mother    Hypertension Mother    Diabetes Mother    Heart disease Father    Skin telangiectasia Maternal Aunt        possible telangiectasia, patient recalls reddish birthmarks   Heart disease Maternal Aunt    Diabetes Maternal Grandmother    Heart disease Paternal Grandfather    Cerebral palsy Cousin        1ST COUSIN?   Diabetes Paternal Grandmother    Heart attack Maternal Grandfather    Colon cancer Maternal Uncle        older than 69 yo at diagnosis   Cerebral palsy Other    Stomach cancer Paternal Uncle     Past Surgical History:  Procedure Laterality Date   ANKLE FRACTURE SURGERY Right    AV FISTULA PLACEMENT Left 06/28/2016   Procedure: left arm ARTERIOVENOUS (AV) FISTULA CREATION;  Surgeon: Oris Krystal FALCON, MD;  Location: [facility] OR;  Service: Vascular;  Laterality: Left;   BASCILIC VEIN TRANSPOSITION Left 09/27/2016   Procedure: LEFT UPPER ARM CEPHALIC VEIN TRANSPOSITION;  Surgeon: Oris Krystal FALCON, MD;  Location: [facility] OR;  Service: Vascular;  Laterality: Left;   BREAST BIOPSY     ? side   CESAREAN SECTION  1994   CO2 LASER OF CERVIX     COLONOSCOPY W/ BIOPSIES  01/08/2008   INSERTION OF DIALYSIS CATHETER Right 06/28/2016   Procedure: INSERTION OF DIALYSIS CATHETER, right internal jugular;  Surgeon: Oris Krystal FALCON, MD;  Location: [facility] OR;  Service: Vascular;  Laterality: Right;   MYOMECTOMY     NASAL ENDOSCOPY WITH EPISTAXIS CONTROL N/A 12/29/2019   Procedure: NASAL ENDOSCOPY WITH EPISTAXIS CONTROL;  Surgeon: Karis Clunes, MD;  Location: [facility] OR;  Service: ENT;  Laterality: N/A;   PELVIC LAPAROSCOPY  2011   superficial thrombophlebitis Left 07-2014   Social History   Occupational History   Not on file  Tobacco Use   Smoking status: Never   Smokeless tobacco: Never  Vaping Use   Vaping status: Never Used  Substance and Sexual Activity   Alcohol use: No   Drug use: No   Sexual activity: Not Currently    Birth control/protection: Post-menopausal

## 2023-01-13 DIAGNOSIS — Z992 Dependence on renal dialysis: Secondary | ICD-10-CM | POA: Diagnosis not present

## 2023-01-13 DIAGNOSIS — D631 Anemia in chronic kidney disease: Secondary | ICD-10-CM | POA: Diagnosis not present

## 2023-01-13 DIAGNOSIS — N186 End stage renal disease: Secondary | ICD-10-CM | POA: Diagnosis not present

## 2023-01-13 DIAGNOSIS — N2581 Secondary hyperparathyroidism of renal origin: Secondary | ICD-10-CM | POA: Diagnosis not present

## 2023-01-13 DIAGNOSIS — D509 Iron deficiency anemia, unspecified: Secondary | ICD-10-CM | POA: Diagnosis not present

## 2023-01-13 DIAGNOSIS — R519 Headache, unspecified: Secondary | ICD-10-CM | POA: Diagnosis not present

## 2023-01-13 MED ORDER — METHYLPREDNISOLONE ACETATE 40 MG/ML IJ SUSP
40.0000 mg | INTRAMUSCULAR | Status: AC | PRN
  Administered 2023-01-11: 40 mg via INTRA_ARTICULAR

## 2023-01-13 MED ORDER — BUPIVACAINE HCL 0.25 % IJ SOLN
4.0000 mL | INTRAMUSCULAR | Status: AC | PRN
  Administered 2023-01-11: 4 mL via INTRA_ARTICULAR

## 2023-01-13 MED ORDER — LIDOCAINE HCL 1 % IJ SOLN
0.5000 mL | INTRAMUSCULAR | Status: AC | PRN
  Administered 2023-01-11: .5 mL

## 2023-01-14 ENCOUNTER — Encounter: Payer: Self-pay | Admitting: Hematology and Oncology

## 2023-01-15 DIAGNOSIS — R519 Headache, unspecified: Secondary | ICD-10-CM | POA: Diagnosis not present

## 2023-01-15 DIAGNOSIS — D631 Anemia in chronic kidney disease: Secondary | ICD-10-CM | POA: Diagnosis not present

## 2023-01-15 DIAGNOSIS — N2581 Secondary hyperparathyroidism of renal origin: Secondary | ICD-10-CM | POA: Diagnosis not present

## 2023-01-15 DIAGNOSIS — Z992 Dependence on renal dialysis: Secondary | ICD-10-CM | POA: Diagnosis not present

## 2023-01-15 DIAGNOSIS — D509 Iron deficiency anemia, unspecified: Secondary | ICD-10-CM | POA: Diagnosis not present

## 2023-01-15 DIAGNOSIS — N186 End stage renal disease: Secondary | ICD-10-CM | POA: Diagnosis not present

## 2023-01-18 DIAGNOSIS — N2581 Secondary hyperparathyroidism of renal origin: Secondary | ICD-10-CM | POA: Diagnosis not present

## 2023-01-18 DIAGNOSIS — N186 End stage renal disease: Secondary | ICD-10-CM | POA: Diagnosis not present

## 2023-01-18 DIAGNOSIS — D631 Anemia in chronic kidney disease: Secondary | ICD-10-CM | POA: Diagnosis not present

## 2023-01-18 DIAGNOSIS — D509 Iron deficiency anemia, unspecified: Secondary | ICD-10-CM | POA: Diagnosis not present

## 2023-01-18 DIAGNOSIS — Z992 Dependence on renal dialysis: Secondary | ICD-10-CM | POA: Diagnosis not present

## 2023-01-18 DIAGNOSIS — R519 Headache, unspecified: Secondary | ICD-10-CM | POA: Diagnosis not present

## 2023-01-19 DIAGNOSIS — K7460 Unspecified cirrhosis of liver: Secondary | ICD-10-CM | POA: Diagnosis not present

## 2023-01-20 DIAGNOSIS — Z992 Dependence on renal dialysis: Secondary | ICD-10-CM | POA: Diagnosis not present

## 2023-01-20 DIAGNOSIS — N186 End stage renal disease: Secondary | ICD-10-CM | POA: Diagnosis not present

## 2023-01-20 DIAGNOSIS — D509 Iron deficiency anemia, unspecified: Secondary | ICD-10-CM | POA: Diagnosis not present

## 2023-01-20 DIAGNOSIS — R519 Headache, unspecified: Secondary | ICD-10-CM | POA: Diagnosis not present

## 2023-01-20 DIAGNOSIS — N2581 Secondary hyperparathyroidism of renal origin: Secondary | ICD-10-CM | POA: Diagnosis not present

## 2023-01-20 DIAGNOSIS — D631 Anemia in chronic kidney disease: Secondary | ICD-10-CM | POA: Diagnosis not present

## 2023-01-22 DIAGNOSIS — R519 Headache, unspecified: Secondary | ICD-10-CM | POA: Diagnosis not present

## 2023-01-22 DIAGNOSIS — D631 Anemia in chronic kidney disease: Secondary | ICD-10-CM | POA: Diagnosis not present

## 2023-01-22 DIAGNOSIS — N2581 Secondary hyperparathyroidism of renal origin: Secondary | ICD-10-CM | POA: Diagnosis not present

## 2023-01-22 DIAGNOSIS — N186 End stage renal disease: Secondary | ICD-10-CM | POA: Diagnosis not present

## 2023-01-22 DIAGNOSIS — D509 Iron deficiency anemia, unspecified: Secondary | ICD-10-CM | POA: Diagnosis not present

## 2023-01-22 DIAGNOSIS — Z992 Dependence on renal dialysis: Secondary | ICD-10-CM | POA: Diagnosis not present

## 2023-01-24 DIAGNOSIS — R519 Headache, unspecified: Secondary | ICD-10-CM | POA: Diagnosis not present

## 2023-01-24 DIAGNOSIS — D631 Anemia in chronic kidney disease: Secondary | ICD-10-CM | POA: Diagnosis not present

## 2023-01-24 DIAGNOSIS — N2581 Secondary hyperparathyroidism of renal origin: Secondary | ICD-10-CM | POA: Diagnosis not present

## 2023-01-24 DIAGNOSIS — Z992 Dependence on renal dialysis: Secondary | ICD-10-CM | POA: Diagnosis not present

## 2023-01-24 DIAGNOSIS — D509 Iron deficiency anemia, unspecified: Secondary | ICD-10-CM | POA: Diagnosis not present

## 2023-01-24 DIAGNOSIS — N186 End stage renal disease: Secondary | ICD-10-CM | POA: Diagnosis not present

## 2023-01-25 DIAGNOSIS — N2581 Secondary hyperparathyroidism of renal origin: Secondary | ICD-10-CM | POA: Diagnosis not present

## 2023-01-25 DIAGNOSIS — D631 Anemia in chronic kidney disease: Secondary | ICD-10-CM | POA: Diagnosis not present

## 2023-01-25 DIAGNOSIS — N186 End stage renal disease: Secondary | ICD-10-CM | POA: Diagnosis not present

## 2023-01-25 DIAGNOSIS — Z992 Dependence on renal dialysis: Secondary | ICD-10-CM | POA: Diagnosis not present

## 2023-01-25 DIAGNOSIS — D509 Iron deficiency anemia, unspecified: Secondary | ICD-10-CM | POA: Diagnosis not present

## 2023-01-25 DIAGNOSIS — R519 Headache, unspecified: Secondary | ICD-10-CM | POA: Diagnosis not present

## 2023-01-26 DIAGNOSIS — N8184 Pelvic muscle wasting: Secondary | ICD-10-CM | POA: Diagnosis not present

## 2023-01-26 DIAGNOSIS — K5902 Outlet dysfunction constipation: Secondary | ICD-10-CM | POA: Diagnosis not present

## 2023-01-27 DIAGNOSIS — R519 Headache, unspecified: Secondary | ICD-10-CM | POA: Diagnosis not present

## 2023-01-27 DIAGNOSIS — D509 Iron deficiency anemia, unspecified: Secondary | ICD-10-CM | POA: Diagnosis not present

## 2023-01-27 DIAGNOSIS — Z992 Dependence on renal dialysis: Secondary | ICD-10-CM | POA: Diagnosis not present

## 2023-01-27 DIAGNOSIS — N2581 Secondary hyperparathyroidism of renal origin: Secondary | ICD-10-CM | POA: Diagnosis not present

## 2023-01-27 DIAGNOSIS — N186 End stage renal disease: Secondary | ICD-10-CM | POA: Diagnosis not present

## 2023-01-27 DIAGNOSIS — D631 Anemia in chronic kidney disease: Secondary | ICD-10-CM | POA: Diagnosis not present

## 2023-01-29 DIAGNOSIS — R519 Headache, unspecified: Secondary | ICD-10-CM | POA: Diagnosis not present

## 2023-01-29 DIAGNOSIS — Z992 Dependence on renal dialysis: Secondary | ICD-10-CM | POA: Diagnosis not present

## 2023-01-29 DIAGNOSIS — D631 Anemia in chronic kidney disease: Secondary | ICD-10-CM | POA: Diagnosis not present

## 2023-01-29 DIAGNOSIS — N2581 Secondary hyperparathyroidism of renal origin: Secondary | ICD-10-CM | POA: Diagnosis not present

## 2023-01-29 DIAGNOSIS — N186 End stage renal disease: Secondary | ICD-10-CM | POA: Diagnosis not present

## 2023-01-29 DIAGNOSIS — D509 Iron deficiency anemia, unspecified: Secondary | ICD-10-CM | POA: Diagnosis not present

## 2023-02-01 DIAGNOSIS — Z992 Dependence on renal dialysis: Secondary | ICD-10-CM | POA: Diagnosis not present

## 2023-02-01 DIAGNOSIS — D631 Anemia in chronic kidney disease: Secondary | ICD-10-CM | POA: Diagnosis not present

## 2023-02-01 DIAGNOSIS — N186 End stage renal disease: Secondary | ICD-10-CM | POA: Diagnosis not present

## 2023-02-01 DIAGNOSIS — N2581 Secondary hyperparathyroidism of renal origin: Secondary | ICD-10-CM | POA: Diagnosis not present

## 2023-02-01 DIAGNOSIS — D509 Iron deficiency anemia, unspecified: Secondary | ICD-10-CM | POA: Diagnosis not present

## 2023-02-01 DIAGNOSIS — R519 Headache, unspecified: Secondary | ICD-10-CM | POA: Diagnosis not present

## 2023-02-02 ENCOUNTER — Encounter (HOSPITAL_COMMUNITY): Payer: Self-pay | Admitting: Internal Medicine

## 2023-02-02 ENCOUNTER — Ambulatory Visit (HOSPITAL_COMMUNITY)
Admission: RE | Admit: 2023-02-02 | Discharge: 2023-02-02 | Disposition: A | Discharge status: Home / Self Care | Payer: Medicare Other | Attending: Internal Medicine | Admitting: Internal Medicine

## 2023-02-02 ENCOUNTER — Other Ambulatory Visit: Payer: Self-pay

## 2023-02-02 ENCOUNTER — Encounter (HOSPITAL_COMMUNITY)
Admission: RE | Disposition: A | Discharge status: Home / Self Care | Payer: Self-pay | Source: Home / Self Care | Attending: Internal Medicine

## 2023-02-02 DIAGNOSIS — M069 Rheumatoid arthritis, unspecified: Secondary | ICD-10-CM | POA: Insufficient documentation

## 2023-02-02 DIAGNOSIS — T82898A Other specified complication of vascular prosthetic devices, implants and grafts, initial encounter: Secondary | ICD-10-CM | POA: Insufficient documentation

## 2023-02-02 DIAGNOSIS — N186 End stage renal disease: Secondary | ICD-10-CM | POA: Insufficient documentation

## 2023-02-02 DIAGNOSIS — T82598A Other mechanical complication of other cardiac and vascular devices and implants, initial encounter: Secondary | ICD-10-CM | POA: Diagnosis not present

## 2023-02-02 DIAGNOSIS — Y839 Surgical procedure, unspecified as the cause of abnormal reaction of the patient, or of later complication, without mention of misadventure at the time of the procedure: Secondary | ICD-10-CM | POA: Diagnosis not present

## 2023-02-02 DIAGNOSIS — I120 Hypertensive chronic kidney disease with stage 5 chronic kidney disease or end stage renal disease: Secondary | ICD-10-CM | POA: Diagnosis not present

## 2023-02-02 DIAGNOSIS — K589 Irritable bowel syndrome without diarrhea: Secondary | ICD-10-CM | POA: Insufficient documentation

## 2023-02-02 DIAGNOSIS — M349 Systemic sclerosis, unspecified: Secondary | ICD-10-CM | POA: Insufficient documentation

## 2023-02-02 DIAGNOSIS — Z992 Dependence on renal dialysis: Secondary | ICD-10-CM | POA: Insufficient documentation

## 2023-02-02 HISTORY — PX: A/V FISTULAGRAM: CATH118298

## 2023-02-02 SURGERY — A/V FISTULAGRAM
Anesthesia: LOCAL

## 2023-02-02 MED ORDER — SODIUM CHLORIDE 0.9 % IV SOLN: INTRAVENOUS | Status: DC

## 2023-02-02 MED ORDER — IODIXANOL 320 MG/ML IV SOLN
INTRAVENOUS | Status: DC | PRN
  Administered 2023-02-02: 6 mL via INTRAVENOUS

## 2023-02-02 MED ORDER — ACETAMINOPHEN 325 MG PO TABS: 650.0000 mg | ORAL_TABLET | ORAL | Status: DC | PRN

## 2023-02-02 MED ORDER — LIDOCAINE HCL (PF) 1 % IJ SOLN
INTRAMUSCULAR | Status: DC | PRN
  Administered 2023-02-02: 2 mL via SUBCUTANEOUS

## 2023-02-02 MED ORDER — HEPARIN (PORCINE) IN NACL 1000-0.9 UT/500ML-% IV SOLN
INTRAVENOUS | Status: DC | PRN
  Administered 2023-02-02: 500 mL

## 2023-02-02 MED ORDER — LIDOCAINE HCL (PF) 1 % IJ SOLN
INTRAMUSCULAR | Status: AC
Start: 2023-02-02 — End: ?
  Filled 2023-02-02: qty 30

## 2023-02-02 SURGICAL SUPPLY — 5 items
COVER DOME SNAP 22 D (MISCELLANEOUS) ×1 IMPLANT
STOPCOCK MORSE 400PSI 3WAY (MISCELLANEOUS) ×1 IMPLANT
TRAY PV CATH (CUSTOM PROCEDURE TRAY) ×1 IMPLANT
TUBING CIL FLEX 10 FLL-RA (TUBING) ×1 IMPLANT
WIRE MICRO SET SILHO 5FR 7 (SHEATH) IMPLANT

## 2023-02-02 NOTE — H&P (Signed)
[facility]  Vascular Access Procedure H&P  Reason for Consultation: HD access malfunction Requesting Provider: Dr. Thedore Mins  HPI: Katie Nunez is an 68 y.o. female with ESRD on HD, HTN, scherloderma, RA, IBS h/o thrombocytopenia who presents for evaluation of her LUE AVF for prolonged bleeding.   She reports LUE BC AVF is almost 66 years old and her last intervention was inflow and outflow PTA in 2022.  Recently she's had some prolonged bleeding after HD treatments which has been somewhat variable.  No other issues with HD.  Denies dyspnea, orthopnea, f/c.  Her chart includes a possible h/o HIT though she states Dr. Cyndie Chime did rule this out.    PMH: Past Medical History:  Diagnosis Date   Achalasia    Anxiety    Dysplasia of cervix, low grade (CIN 1)    Environmental allergies    "all year long" (12/27/2016)   ESRD (end stage renal disease) on dialysis (HCC)    "TTS; [facility]" (12/27/2016)   Fibromyalgia    GERD (gastroesophageal reflux disease)    Gout    Hypertension    IBS (irritable bowel syndrome)    MVP (mitral valve prolapse)    RA (rheumatoid arthritis) (HCC)    FOLLOWED BY DR. SHANAHAN   Raynaud's disease    Scleroderma (HCC)    Seasonal allergies    Thrombocytopenia (HCC) 07/01/2016   Acute fall to 13,000 07/01/16   Tubular adenoma 01/08/2008   CECUM   Vitamin D deficiency    PSH: Past Surgical History:  Procedure Laterality Date   ANKLE FRACTURE SURGERY Right    AV FISTULA PLACEMENT Left 06/28/2016   Procedure: left arm ARTERIOVENOUS (AV) FISTULA CREATION;  Surgeon: Larina Earthly, MD;  Location: [facility] OR;  Service: Vascular;  Laterality: Left;   BASCILIC VEIN TRANSPOSITION Left 09/27/2016   Procedure: LEFT UPPER ARM CEPHALIC VEIN TRANSPOSITION;  Surgeon: Larina Earthly, MD;  Location: [facility] OR;  Service: Vascular;  Laterality: Left;   BREAST BIOPSY     "? side"   CESAREAN SECTION  1994   CO2 LASER OF CERVIX     COLONOSCOPY W/ BIOPSIES   01/08/2008   INSERTION OF DIALYSIS CATHETER Right 06/28/2016   Procedure: INSERTION OF DIALYSIS CATHETER, right internal jugular;  Surgeon: Larina Earthly, MD;  Location: [facility] OR;  Service: Vascular;  Laterality: Right;   MYOMECTOMY     NASAL ENDOSCOPY WITH EPISTAXIS CONTROL N/A 12/29/2019   Procedure: NASAL ENDOSCOPY WITH EPISTAXIS CONTROL;  Surgeon: Newman Pies, MD;  Location: [facility] OR;  Service: ENT;  Laterality: N/A;   PELVIC LAPAROSCOPY  2011   superficial thrombophlebitis Left 07-2014     Past Medical History:  Diagnosis Date   Achalasia    Anxiety    Dysplasia of cervix, low grade (CIN 1)    Environmental allergies    "all year long" (12/27/2016)   ESRD (end stage renal disease) on dialysis (HCC)    "TTS; [facility]" (12/27/2016)   Fibromyalgia    GERD (gastroesophageal reflux disease)    Gout    Hypertension    IBS (irritable bowel syndrome)    MVP (mitral valve prolapse)    RA (rheumatoid arthritis) (HCC)    FOLLOWED BY DR. SHANAHAN   Raynaud's disease    Scleroderma (HCC)    Seasonal allergies    Thrombocytopenia (HCC) 07/01/2016   Acute fall to 13,000 07/01/16   Tubular adenoma 01/08/2008   CECUM   Vitamin D deficiency  Medications:  I have reviewed the patient's current medications.  Medications Prior to Admission  Medication Sig Dispense Refill   acetaminophen (TYLENOL) 325 MG tablet Take 650 mg by mouth every 6 (six) hours as needed for mild pain (pain score 1-3), moderate pain (pain score 4-6) or headache.     albuterol (VENTOLIN HFA) 108 (90 Base) MCG/ACT inhaler Inhale 2 puffs into the lungs every 6 (six) hours as needed for wheezing or shortness of breath. 8 g 6   ambrisentan (LETAIRIS) 10 MG tablet Take 10 mg by mouth daily.     aspirin EC 81 MG tablet Take 1 tablet (81 mg total) by mouth daily. Swallow whole. 30 tablet 11   atorvastatin (LIPITOR) 80 MG tablet Take 1 tablet (80 mg total) by mouth daily. 90 tablet 3   azelastine (ASTELIN) 0.1 % nasal spray  Place 1 spray into both nostrils 2 (two) times daily as needed for rhinitis. Use in each nostril as directed 30 mL 12   bisacodyl (DULCOLAX) 5 MG EC tablet Take 1 tablet (5 mg total) by mouth daily as needed for severe constipation. 10 tablet 0   calcium acetate (PHOSLO) 667 MG capsule Take 667 mg by mouth daily as needed (depending on phosphorus levels).     camphor-menthol (SARNA) lotion Apply topically 2 (two) times daily. (Patient taking differently: Apply 1 Application topically as needed for itching.) 222 mL 0   cinacalcet (SENSIPAR) 60 MG tablet Take 60 mg by mouth Every Tuesday,Thursday,and Saturday with dialysis.     clonazePAM (KLONOPIN) 0.5 MG tablet Take 1 tablet (0.5 mg total) by mouth 2 (two) times daily as needed for anxiety. 30 tablet 0   Cyanocobalamin (B-12 PO) Take 100 mcg by mouth daily.     cyclobenzaprine (FLEXERIL) 5 MG tablet Take 1 tablet (5 mg total) by mouth every 8 (eight) hours as needed for muscle spasms. (Patient taking differently: Take 10 mg by mouth every 8 (eight) hours as needed for muscle spasms.) 30 tablet 0   docusate sodium (COLACE) 100 MG capsule Take 2 capsules (200 mg total) by mouth daily. (Patient taking differently: Take 200 mg by mouth daily as needed for mild constipation or moderate constipation.) 60 capsule 0   famotidine (PEPCID) 20 MG tablet Take 1 tablet (20 mg total) by mouth daily as needed for heartburn or indigestion. (Patient taking differently: Take 40 mg by mouth 2 (two) times daily as needed for heartburn or indigestion.) 30 tablet 0   fluticasone (FLONASE) 50 MCG/ACT nasal spray Place 1 spray into both nostrils daily for 7 days. (Patient taking differently: Place 1 spray into both nostrils daily as needed for allergies or rhinitis.) 15.8 mL 0   folic acid (FOLVITE) 1 MG tablet Take 1 tablet (1 mg total) by mouth daily. 90 tablet 2   gabapentin (NEURONTIN) 100 MG capsule Take 1 capsule (100 mg total) by mouth 3 (three) times daily. Take 1  capsule in morning and two at night (Patient taking differently: Take 100 mg by mouth daily as needed (Nerve pain).) 90 capsule 0   hydrOXYzine (ATARAX/VISTARIL) 25 MG tablet Take 25 mg by mouth every 8 (eight) hours as needed for itching.      hyoscyamine (ANASPAZ) 0.125 MG TBDP disintergrating tablet Place 0.125 mg under the tongue every 4 (four) hours as needed for cramping.     lanthanum (FOSRENOL) 1000 MG chewable tablet Chew 1,000-2,000 mg by mouth 3 (three) times daily with meals. Snack 1000 mg  levocetirizine (XYZAL) 5 MG tablet Take 5 mg by mouth every evening.     Methoxy PEG-Epoetin Beta (MIRCERA IJ) Mircera     polyethylene glycol (MIRALAX / GLYCOLAX) 17 g packet Take 17 g by mouth 2 (two) times daily. (Patient taking differently: Take 15 g by mouth daily as needed for mild constipation or moderate constipation.) 14 each 0   Selexipag (UPTRAVI) 800 MCG TABS Take 1 tablet (800 mcg total) by mouth in the morning and at bedtime. 60 tablet 0   WINREVAIR subcutaneous injection Inject into the skin every 21 ( twenty-one) days.     multivitamin (RENA-VIT) TABS tablet Take 1 tablet by mouth daily at 6 (six) AM. (Patient not taking: Reported on 01/31/2023) 30 tablet 0    ALLERGIES:   Allergies  Allergen Reactions   Other Anaphylaxis and Other (See Comments)    Do not use polyflux membrane.  Use alternate heart racing   Nitrofuran Derivatives     Advised not to take   [facility]cl] Palpitations and Other (See Comments)    Unknown   Tape Rash and Other (See Comments)    Itch- unsure if it was paper or adhesive tape    FAM HX: Family History  Problem Relation Age of Onset   Cerebral aneurysm Mother    Hypertension Mother    Diabetes Mother    Heart disease Father    Skin telangiectasia Maternal Aunt        possible telangiectasia, patient recalls reddish birthmarks   Heart disease Maternal Aunt    Diabetes Maternal Grandmother    Heart disease Paternal Grandfather     Cerebral palsy Cousin        1ST COUSIN?   Diabetes Paternal Grandmother    Heart attack Maternal Grandfather    Colon cancer Maternal Uncle        older than 30 yo at diagnosis   Cerebral palsy Other    Stomach cancer Paternal Uncle     Social History:   reports that she has never smoked. She has never used smokeless tobacco. She reports that she does not drink alcohol and does not use drugs.  ROS: 12 system relevant ROS neg except per HPI above  Blood pressure 120/64, pulse 75, resp. rate 18, height 5\' 5"  (1.651 m), weight 56.7 kg, last menstrual period 11/15/2008, SpO2 93%. PHYSICAL EXAM: Gen: well appearing woman  Eyes: anicteric ENT: class 2 airway, a few tongue telangectasias noted Neck: supple CV:  RRR Lungs: clear ant, normal sats on RA Extr:  no edema, LUE AVF with good thrill and bruit; normal augmentation but not fully collapsing with elevation of the arm; aneurysmally dilated with some mildy hypopigmented skin centrally Neuro: nonfocal    No results found for this or any previous visit (from the past 48 hours).  No results found.  Assessment/Plan Katie Nunez is an 68 y.o. female with ESRD on HD, HTN, scherloderma, RA, IBS h/o thrombocytopenia who presents for evaluation of her LUE AVF for prolonged bleeding.   **ESRD with HD access dysfunction:  presenting with prolonged bleeding and exam suggests possible outflow stenosis.  Plan for angiogram today and angioplasty if stenosis identified.  She is agreeable to proceed and requests no IV sedation.  She has no contraindications to such and if she changes her mild we're happy to oblige.   Tyler Pita 02/02/2023, 9:18 AM

## 2023-02-02 NOTE — Discharge Instructions (Signed)
General care instructions: - You should be able to eat, drink, and resume your normal medications. - Avoid any strenuous activity for the remainder of the day. Potential complications: - Your hand is more cold or numb than usual. - You are bleeding at the site and it will not stop with direct pressure. If it was a declot expect some oozing at the site. Avoid extreme pressure to the site. - You have a change in the bruit and /or thrill in your fistula or graft. - You have a fever, swelling, see redness or feel heat at or near the puncture site. Medication instructions: - Continue routine medications unless otherwise instructed. 3. Please have your sutures removed at your next scheduled dialysis treatment.

## 2023-02-02 NOTE — Op Note (Addendum)
Patient presents with decreased access flows of her left BCF (placed approx 7 y ago) and occasional prolonged bleeding. Her last procedure was in 2022 inflow and outflow PTA.  She had several evaluation since then for drops in AF but physical exam was always normal so she did not require angiogram or intervention. On examination, the brachial cephalic fistula is mildly pulsatile.  Augmentation is normal.   Summary:   1)      The body of the cephalic vein fistula was patent with good flows. 2)      Duplicated cephalic arch with some collateral flow but no distinct stenoses. 3)      The centrals were widely patent. 4)      This left BCF remains amenable to future percutaneous intervention.  Description of procedure: The arm was prepped and draped in the usual sterile fashion. The left upper arm brachial cephalic fistula was cannulated (19147) with an 21G micropuncture needle directed in an antegrade direction. A guidewire was inserted and exchanged for a 6Fr sheath. Contrast  injection via the side port of the sheath was performed. The angiogram of the fistula patent body of the left BCF, patent but duplicated cephalic vein arch with some collaterals present.   I evaluated the body of the AVF in several angles to fully image and found no evidence of hemodynamically significant stenosis.   Hemostasis: A 3-0 ethilon purse string suture was placed at the cannulation site on removal of the sheath.  Sedation: none Sedation time. N/a  Contrast. 8 mL  Monitoring: Because of the patient's comorbid conditions and sedation during the procedure, continuous EKG monitoring and O2 saturation monitoring was performed throughout the procedure by the RN. There were no abnormal arrhythmias encountered.  Complications: None.   Diagnoses: T82.598A other complication of dialysis access (low AF, prolonged bleeding) N18.6 ESRD   Procedure Coding:  36901 Cannulation and angiogram of fistula [id number]  Contrast  Recommendations:  1. Continue to cannulate the fistula with 15G needles.   Avoid cannulation the of lighter skin on the midline of her access - this is likely the etiology of her prolonged bleeding as this skin has lost elasticity.  2. Refer back for problems with flows. 3. Remove the suture next treatment.   Discharge: The patient was discharged home in stable condition. The patient was given education regarding the care of the dialysis access AVF and specific instructions in case of any problems.

## 2023-02-03 ENCOUNTER — Encounter (HOSPITAL_COMMUNITY): Payer: Self-pay | Admitting: Internal Medicine

## 2023-02-03 DIAGNOSIS — Z992 Dependence on renal dialysis: Secondary | ICD-10-CM | POA: Diagnosis not present

## 2023-02-03 DIAGNOSIS — R519 Headache, unspecified: Secondary | ICD-10-CM | POA: Diagnosis not present

## 2023-02-03 DIAGNOSIS — N186 End stage renal disease: Secondary | ICD-10-CM | POA: Diagnosis not present

## 2023-02-03 DIAGNOSIS — N2581 Secondary hyperparathyroidism of renal origin: Secondary | ICD-10-CM | POA: Diagnosis not present

## 2023-02-03 DIAGNOSIS — D631 Anemia in chronic kidney disease: Secondary | ICD-10-CM | POA: Diagnosis not present

## 2023-02-03 DIAGNOSIS — D509 Iron deficiency anemia, unspecified: Secondary | ICD-10-CM | POA: Diagnosis not present

## 2023-02-05 DIAGNOSIS — D631 Anemia in chronic kidney disease: Secondary | ICD-10-CM | POA: Diagnosis not present

## 2023-02-05 DIAGNOSIS — N2581 Secondary hyperparathyroidism of renal origin: Secondary | ICD-10-CM | POA: Diagnosis not present

## 2023-02-05 DIAGNOSIS — D509 Iron deficiency anemia, unspecified: Secondary | ICD-10-CM | POA: Diagnosis not present

## 2023-02-05 DIAGNOSIS — N186 End stage renal disease: Secondary | ICD-10-CM | POA: Diagnosis not present

## 2023-02-05 DIAGNOSIS — R519 Headache, unspecified: Secondary | ICD-10-CM | POA: Diagnosis not present

## 2023-02-05 DIAGNOSIS — Z992 Dependence on renal dialysis: Secondary | ICD-10-CM | POA: Diagnosis not present

## 2023-02-08 DIAGNOSIS — N186 End stage renal disease: Secondary | ICD-10-CM | POA: Diagnosis not present

## 2023-02-08 DIAGNOSIS — D631 Anemia in chronic kidney disease: Secondary | ICD-10-CM | POA: Diagnosis not present

## 2023-02-08 DIAGNOSIS — R519 Headache, unspecified: Secondary | ICD-10-CM | POA: Diagnosis not present

## 2023-02-08 DIAGNOSIS — Z992 Dependence on renal dialysis: Secondary | ICD-10-CM | POA: Diagnosis not present

## 2023-02-08 DIAGNOSIS — N2581 Secondary hyperparathyroidism of renal origin: Secondary | ICD-10-CM | POA: Diagnosis not present

## 2023-02-08 DIAGNOSIS — D509 Iron deficiency anemia, unspecified: Secondary | ICD-10-CM | POA: Diagnosis not present

## 2023-02-10 DIAGNOSIS — N2581 Secondary hyperparathyroidism of renal origin: Secondary | ICD-10-CM | POA: Diagnosis not present

## 2023-02-10 DIAGNOSIS — D631 Anemia in chronic kidney disease: Secondary | ICD-10-CM | POA: Diagnosis not present

## 2023-02-10 DIAGNOSIS — D509 Iron deficiency anemia, unspecified: Secondary | ICD-10-CM | POA: Diagnosis not present

## 2023-02-10 DIAGNOSIS — R519 Headache, unspecified: Secondary | ICD-10-CM | POA: Diagnosis not present

## 2023-02-10 DIAGNOSIS — Z992 Dependence on renal dialysis: Secondary | ICD-10-CM | POA: Diagnosis not present

## 2023-02-10 DIAGNOSIS — N186 End stage renal disease: Secondary | ICD-10-CM | POA: Diagnosis not present

## 2023-02-11 DIAGNOSIS — Z992 Dependence on renal dialysis: Secondary | ICD-10-CM | POA: Diagnosis not present

## 2023-02-11 DIAGNOSIS — N186 End stage renal disease: Secondary | ICD-10-CM | POA: Diagnosis not present

## 2023-02-11 DIAGNOSIS — N269 Renal sclerosis, unspecified: Secondary | ICD-10-CM | POA: Diagnosis not present

## 2023-02-12 DIAGNOSIS — N2581 Secondary hyperparathyroidism of renal origin: Secondary | ICD-10-CM | POA: Diagnosis not present

## 2023-02-12 DIAGNOSIS — D631 Anemia in chronic kidney disease: Secondary | ICD-10-CM | POA: Diagnosis not present

## 2023-02-12 DIAGNOSIS — N186 End stage renal disease: Secondary | ICD-10-CM | POA: Diagnosis not present

## 2023-02-12 DIAGNOSIS — Z992 Dependence on renal dialysis: Secondary | ICD-10-CM | POA: Diagnosis not present

## 2023-02-12 DIAGNOSIS — D509 Iron deficiency anemia, unspecified: Secondary | ICD-10-CM | POA: Diagnosis not present

## 2023-02-14 DIAGNOSIS — I2721 Secondary pulmonary arterial hypertension: Secondary | ICD-10-CM | POA: Diagnosis not present

## 2023-02-14 DIAGNOSIS — H6121 Impacted cerumen, right ear: Secondary | ICD-10-CM | POA: Diagnosis not present

## 2023-02-14 DIAGNOSIS — C44222 Squamous cell carcinoma of skin of right ear and external auricular canal: Secondary | ICD-10-CM | POA: Diagnosis not present

## 2023-02-14 DIAGNOSIS — N186 End stage renal disease: Secondary | ICD-10-CM | POA: Diagnosis not present

## 2023-02-14 DIAGNOSIS — I781 Nevus, non-neoplastic: Secondary | ICD-10-CM | POA: Diagnosis not present

## 2023-02-14 DIAGNOSIS — Z7982 Long term (current) use of aspirin: Secondary | ICD-10-CM | POA: Diagnosis not present

## 2023-02-14 DIAGNOSIS — Z79899 Other long term (current) drug therapy: Secondary | ICD-10-CM | POA: Diagnosis not present

## 2023-02-14 DIAGNOSIS — M3500 Sicca syndrome, unspecified: Secondary | ICD-10-CM | POA: Diagnosis not present

## 2023-02-14 DIAGNOSIS — Z992 Dependence on renal dialysis: Secondary | ICD-10-CM | POA: Diagnosis not present

## 2023-02-14 DIAGNOSIS — D693 Immune thrombocytopenic purpura: Secondary | ICD-10-CM | POA: Diagnosis not present

## 2023-02-14 DIAGNOSIS — Z9981 Dependence on supplemental oxygen: Secondary | ICD-10-CM | POA: Diagnosis not present

## 2023-02-14 DIAGNOSIS — Z8673 Personal history of transient ischemic attack (TIA), and cerebral infarction without residual deficits: Secondary | ICD-10-CM | POA: Diagnosis not present

## 2023-02-14 DIAGNOSIS — M069 Rheumatoid arthritis, unspecified: Secondary | ICD-10-CM | POA: Diagnosis not present

## 2023-02-14 DIAGNOSIS — J342 Deviated nasal septum: Secondary | ICD-10-CM | POA: Diagnosis not present

## 2023-02-14 DIAGNOSIS — R040 Epistaxis: Secondary | ICD-10-CM | POA: Diagnosis not present

## 2023-02-14 DIAGNOSIS — Z9889 Other specified postprocedural states: Secondary | ICD-10-CM | POA: Diagnosis not present

## 2023-02-14 DIAGNOSIS — I50812 Chronic right heart failure: Secondary | ICD-10-CM | POA: Diagnosis not present

## 2023-02-15 DIAGNOSIS — N186 End stage renal disease: Secondary | ICD-10-CM | POA: Diagnosis not present

## 2023-02-15 DIAGNOSIS — Z992 Dependence on renal dialysis: Secondary | ICD-10-CM | POA: Diagnosis not present

## 2023-02-15 DIAGNOSIS — N2581 Secondary hyperparathyroidism of renal origin: Secondary | ICD-10-CM | POA: Diagnosis not present

## 2023-02-15 DIAGNOSIS — D509 Iron deficiency anemia, unspecified: Secondary | ICD-10-CM | POA: Diagnosis not present

## 2023-02-15 DIAGNOSIS — D631 Anemia in chronic kidney disease: Secondary | ICD-10-CM | POA: Diagnosis not present

## 2023-02-17 DIAGNOSIS — N186 End stage renal disease: Secondary | ICD-10-CM | POA: Diagnosis not present

## 2023-02-17 DIAGNOSIS — N2581 Secondary hyperparathyroidism of renal origin: Secondary | ICD-10-CM | POA: Diagnosis not present

## 2023-02-17 DIAGNOSIS — Z992 Dependence on renal dialysis: Secondary | ICD-10-CM | POA: Diagnosis not present

## 2023-02-17 DIAGNOSIS — D509 Iron deficiency anemia, unspecified: Secondary | ICD-10-CM | POA: Diagnosis not present

## 2023-02-17 DIAGNOSIS — D631 Anemia in chronic kidney disease: Secondary | ICD-10-CM | POA: Diagnosis not present

## 2023-02-19 DIAGNOSIS — N2581 Secondary hyperparathyroidism of renal origin: Secondary | ICD-10-CM | POA: Diagnosis not present

## 2023-02-19 DIAGNOSIS — N186 End stage renal disease: Secondary | ICD-10-CM | POA: Diagnosis not present

## 2023-02-19 DIAGNOSIS — Z992 Dependence on renal dialysis: Secondary | ICD-10-CM | POA: Diagnosis not present

## 2023-02-19 DIAGNOSIS — D509 Iron deficiency anemia, unspecified: Secondary | ICD-10-CM | POA: Diagnosis not present

## 2023-02-19 DIAGNOSIS — D631 Anemia in chronic kidney disease: Secondary | ICD-10-CM | POA: Diagnosis not present

## 2023-02-22 DIAGNOSIS — N2581 Secondary hyperparathyroidism of renal origin: Secondary | ICD-10-CM | POA: Diagnosis not present

## 2023-02-22 DIAGNOSIS — N186 End stage renal disease: Secondary | ICD-10-CM | POA: Diagnosis not present

## 2023-02-22 DIAGNOSIS — D631 Anemia in chronic kidney disease: Secondary | ICD-10-CM | POA: Diagnosis not present

## 2023-02-22 DIAGNOSIS — Z992 Dependence on renal dialysis: Secondary | ICD-10-CM | POA: Diagnosis not present

## 2023-02-22 DIAGNOSIS — D509 Iron deficiency anemia, unspecified: Secondary | ICD-10-CM | POA: Diagnosis not present

## 2023-02-24 DIAGNOSIS — D631 Anemia in chronic kidney disease: Secondary | ICD-10-CM | POA: Diagnosis not present

## 2023-02-24 DIAGNOSIS — D509 Iron deficiency anemia, unspecified: Secondary | ICD-10-CM | POA: Diagnosis not present

## 2023-02-24 DIAGNOSIS — N2581 Secondary hyperparathyroidism of renal origin: Secondary | ICD-10-CM | POA: Diagnosis not present

## 2023-02-24 DIAGNOSIS — N186 End stage renal disease: Secondary | ICD-10-CM | POA: Diagnosis not present

## 2023-02-24 DIAGNOSIS — Z992 Dependence on renal dialysis: Secondary | ICD-10-CM | POA: Diagnosis not present

## 2023-02-26 DIAGNOSIS — D631 Anemia in chronic kidney disease: Secondary | ICD-10-CM | POA: Diagnosis not present

## 2023-02-26 DIAGNOSIS — N186 End stage renal disease: Secondary | ICD-10-CM | POA: Diagnosis not present

## 2023-02-26 DIAGNOSIS — D509 Iron deficiency anemia, unspecified: Secondary | ICD-10-CM | POA: Diagnosis not present

## 2023-02-26 DIAGNOSIS — Z992 Dependence on renal dialysis: Secondary | ICD-10-CM | POA: Diagnosis not present

## 2023-02-26 DIAGNOSIS — N2581 Secondary hyperparathyroidism of renal origin: Secondary | ICD-10-CM | POA: Diagnosis not present

## 2023-03-01 DIAGNOSIS — D631 Anemia in chronic kidney disease: Secondary | ICD-10-CM | POA: Diagnosis not present

## 2023-03-01 DIAGNOSIS — N2581 Secondary hyperparathyroidism of renal origin: Secondary | ICD-10-CM | POA: Diagnosis not present

## 2023-03-01 DIAGNOSIS — D509 Iron deficiency anemia, unspecified: Secondary | ICD-10-CM | POA: Diagnosis not present

## 2023-03-01 DIAGNOSIS — N186 End stage renal disease: Secondary | ICD-10-CM | POA: Diagnosis not present

## 2023-03-01 DIAGNOSIS — Z992 Dependence on renal dialysis: Secondary | ICD-10-CM | POA: Diagnosis not present

## 2023-03-03 DIAGNOSIS — D509 Iron deficiency anemia, unspecified: Secondary | ICD-10-CM | POA: Diagnosis not present

## 2023-03-03 DIAGNOSIS — Z992 Dependence on renal dialysis: Secondary | ICD-10-CM | POA: Diagnosis not present

## 2023-03-03 DIAGNOSIS — N186 End stage renal disease: Secondary | ICD-10-CM | POA: Diagnosis not present

## 2023-03-03 DIAGNOSIS — N2581 Secondary hyperparathyroidism of renal origin: Secondary | ICD-10-CM | POA: Diagnosis not present

## 2023-03-03 DIAGNOSIS — D631 Anemia in chronic kidney disease: Secondary | ICD-10-CM | POA: Diagnosis not present

## 2023-03-05 DIAGNOSIS — Z992 Dependence on renal dialysis: Secondary | ICD-10-CM | POA: Diagnosis not present

## 2023-03-05 DIAGNOSIS — D509 Iron deficiency anemia, unspecified: Secondary | ICD-10-CM | POA: Diagnosis not present

## 2023-03-05 DIAGNOSIS — N2581 Secondary hyperparathyroidism of renal origin: Secondary | ICD-10-CM | POA: Diagnosis not present

## 2023-03-05 DIAGNOSIS — N186 End stage renal disease: Secondary | ICD-10-CM | POA: Diagnosis not present

## 2023-03-05 DIAGNOSIS — D631 Anemia in chronic kidney disease: Secondary | ICD-10-CM | POA: Diagnosis not present

## 2023-03-08 DIAGNOSIS — N2581 Secondary hyperparathyroidism of renal origin: Secondary | ICD-10-CM | POA: Diagnosis not present

## 2023-03-08 DIAGNOSIS — D631 Anemia in chronic kidney disease: Secondary | ICD-10-CM | POA: Diagnosis not present

## 2023-03-08 DIAGNOSIS — N186 End stage renal disease: Secondary | ICD-10-CM | POA: Diagnosis not present

## 2023-03-08 DIAGNOSIS — Z992 Dependence on renal dialysis: Secondary | ICD-10-CM | POA: Diagnosis not present

## 2023-03-08 DIAGNOSIS — D509 Iron deficiency anemia, unspecified: Secondary | ICD-10-CM | POA: Diagnosis not present

## 2023-03-10 DIAGNOSIS — N186 End stage renal disease: Secondary | ICD-10-CM | POA: Diagnosis not present

## 2023-03-10 DIAGNOSIS — D631 Anemia in chronic kidney disease: Secondary | ICD-10-CM | POA: Diagnosis not present

## 2023-03-10 DIAGNOSIS — N2581 Secondary hyperparathyroidism of renal origin: Secondary | ICD-10-CM | POA: Diagnosis not present

## 2023-03-10 DIAGNOSIS — Z992 Dependence on renal dialysis: Secondary | ICD-10-CM | POA: Diagnosis not present

## 2023-03-10 DIAGNOSIS — D509 Iron deficiency anemia, unspecified: Secondary | ICD-10-CM | POA: Diagnosis not present

## 2023-03-11 DIAGNOSIS — Z992 Dependence on renal dialysis: Secondary | ICD-10-CM | POA: Diagnosis not present

## 2023-03-11 DIAGNOSIS — N186 End stage renal disease: Secondary | ICD-10-CM | POA: Diagnosis not present

## 2023-03-11 DIAGNOSIS — N269 Renal sclerosis, unspecified: Secondary | ICD-10-CM | POA: Diagnosis not present

## 2023-03-12 DIAGNOSIS — Z992 Dependence on renal dialysis: Secondary | ICD-10-CM | POA: Diagnosis not present

## 2023-03-12 DIAGNOSIS — N2581 Secondary hyperparathyroidism of renal origin: Secondary | ICD-10-CM | POA: Diagnosis not present

## 2023-03-12 DIAGNOSIS — R519 Headache, unspecified: Secondary | ICD-10-CM | POA: Diagnosis not present

## 2023-03-12 DIAGNOSIS — D509 Iron deficiency anemia, unspecified: Secondary | ICD-10-CM | POA: Diagnosis not present

## 2023-03-12 DIAGNOSIS — D631 Anemia in chronic kidney disease: Secondary | ICD-10-CM | POA: Diagnosis not present

## 2023-03-12 DIAGNOSIS — N186 End stage renal disease: Secondary | ICD-10-CM | POA: Diagnosis not present

## 2023-03-14 ENCOUNTER — Telehealth: Payer: Self-pay | Admitting: Emergency Medicine

## 2023-03-14 DIAGNOSIS — J8410 Pulmonary fibrosis, unspecified: Secondary | ICD-10-CM

## 2023-03-14 NOTE — Telephone Encounter (Signed)
 Patient would like to know if she needs PFT and xray. Patient scheduled with Dr. Delton Coombes on 05/11/2023. Patient phone number is [phone].

## 2023-03-15 DIAGNOSIS — R519 Headache, unspecified: Secondary | ICD-10-CM | POA: Diagnosis not present

## 2023-03-15 DIAGNOSIS — Z992 Dependence on renal dialysis: Secondary | ICD-10-CM | POA: Diagnosis not present

## 2023-03-15 DIAGNOSIS — N186 End stage renal disease: Secondary | ICD-10-CM | POA: Diagnosis not present

## 2023-03-15 DIAGNOSIS — N2581 Secondary hyperparathyroidism of renal origin: Secondary | ICD-10-CM | POA: Diagnosis not present

## 2023-03-15 DIAGNOSIS — D631 Anemia in chronic kidney disease: Secondary | ICD-10-CM | POA: Diagnosis not present

## 2023-03-15 DIAGNOSIS — D509 Iron deficiency anemia, unspecified: Secondary | ICD-10-CM | POA: Diagnosis not present

## 2023-03-15 NOTE — Telephone Encounter (Signed)
 I called and spoke with pt. Pt states she has not had a PFT or an CXR in a while and wanted to know if Dr Delton Coombes wanted her to have one before her f/u appointment in April 2025. Nothing is said about this in the LOV. Routing to Dr Delton Coombes to advise if he wants pt to have tests.

## 2023-03-17 DIAGNOSIS — N2581 Secondary hyperparathyroidism of renal origin: Secondary | ICD-10-CM | POA: Diagnosis not present

## 2023-03-17 DIAGNOSIS — R519 Headache, unspecified: Secondary | ICD-10-CM | POA: Diagnosis not present

## 2023-03-17 DIAGNOSIS — D509 Iron deficiency anemia, unspecified: Secondary | ICD-10-CM | POA: Diagnosis not present

## 2023-03-17 DIAGNOSIS — D631 Anemia in chronic kidney disease: Secondary | ICD-10-CM | POA: Diagnosis not present

## 2023-03-17 DIAGNOSIS — Z992 Dependence on renal dialysis: Secondary | ICD-10-CM | POA: Diagnosis not present

## 2023-03-17 DIAGNOSIS — N186 End stage renal disease: Secondary | ICD-10-CM | POA: Diagnosis not present

## 2023-03-18 NOTE — Telephone Encounter (Signed)
 Yes thank you  - I ordered these

## 2023-03-19 DIAGNOSIS — N186 End stage renal disease: Secondary | ICD-10-CM | POA: Diagnosis not present

## 2023-03-19 DIAGNOSIS — N2581 Secondary hyperparathyroidism of renal origin: Secondary | ICD-10-CM | POA: Diagnosis not present

## 2023-03-19 DIAGNOSIS — D509 Iron deficiency anemia, unspecified: Secondary | ICD-10-CM | POA: Diagnosis not present

## 2023-03-19 DIAGNOSIS — D631 Anemia in chronic kidney disease: Secondary | ICD-10-CM | POA: Diagnosis not present

## 2023-03-19 DIAGNOSIS — Z992 Dependence on renal dialysis: Secondary | ICD-10-CM | POA: Diagnosis not present

## 2023-03-19 DIAGNOSIS — R519 Headache, unspecified: Secondary | ICD-10-CM | POA: Diagnosis not present

## 2023-03-21 ENCOUNTER — Other Ambulatory Visit: Payer: Self-pay | Admitting: Physician Assistant

## 2023-03-21 DIAGNOSIS — D693 Immune thrombocytopenic purpura: Secondary | ICD-10-CM

## 2023-03-21 DIAGNOSIS — R09A2 Foreign body sensation, throat: Secondary | ICD-10-CM | POA: Diagnosis not present

## 2023-03-21 DIAGNOSIS — R040 Epistaxis: Secondary | ICD-10-CM | POA: Diagnosis not present

## 2023-03-21 DIAGNOSIS — D649 Anemia, unspecified: Secondary | ICD-10-CM

## 2023-03-21 NOTE — Telephone Encounter (Signed)
 Routing to front to schedule PFT

## 2023-03-22 DIAGNOSIS — D631 Anemia in chronic kidney disease: Secondary | ICD-10-CM | POA: Diagnosis not present

## 2023-03-22 DIAGNOSIS — N2581 Secondary hyperparathyroidism of renal origin: Secondary | ICD-10-CM | POA: Diagnosis not present

## 2023-03-22 DIAGNOSIS — N186 End stage renal disease: Secondary | ICD-10-CM | POA: Diagnosis not present

## 2023-03-22 DIAGNOSIS — R519 Headache, unspecified: Secondary | ICD-10-CM | POA: Diagnosis not present

## 2023-03-22 DIAGNOSIS — Z992 Dependence on renal dialysis: Secondary | ICD-10-CM | POA: Diagnosis not present

## 2023-03-22 DIAGNOSIS — D509 Iron deficiency anemia, unspecified: Secondary | ICD-10-CM | POA: Diagnosis not present

## 2023-03-23 ENCOUNTER — Telehealth: Payer: Self-pay

## 2023-03-23 ENCOUNTER — Inpatient Hospital Stay: Payer: Medicare Other

## 2023-03-23 ENCOUNTER — Inpatient Hospital Stay: Payer: Medicare Other | Admitting: Physician Assistant

## 2023-03-23 ENCOUNTER — Inpatient Hospital Stay: Attending: Physician Assistant

## 2023-03-23 DIAGNOSIS — D631 Anemia in chronic kidney disease: Secondary | ICD-10-CM | POA: Insufficient documentation

## 2023-03-23 DIAGNOSIS — M180 Bilateral primary osteoarthritis of first carpometacarpal joints: Secondary | ICD-10-CM | POA: Diagnosis not present

## 2023-03-23 DIAGNOSIS — D72819 Decreased white blood cell count, unspecified: Secondary | ICD-10-CM | POA: Insufficient documentation

## 2023-03-23 DIAGNOSIS — M65311 Trigger thumb, right thumb: Secondary | ICD-10-CM | POA: Diagnosis not present

## 2023-03-23 DIAGNOSIS — M65322 Trigger finger, left index finger: Secondary | ICD-10-CM | POA: Diagnosis not present

## 2023-03-23 DIAGNOSIS — N186 End stage renal disease: Secondary | ICD-10-CM | POA: Insufficient documentation

## 2023-03-23 DIAGNOSIS — Z992 Dependence on renal dialysis: Secondary | ICD-10-CM | POA: Diagnosis not present

## 2023-03-23 DIAGNOSIS — E538 Deficiency of other specified B group vitamins: Secondary | ICD-10-CM | POA: Diagnosis not present

## 2023-03-23 DIAGNOSIS — M65321 Trigger finger, right index finger: Secondary | ICD-10-CM | POA: Diagnosis not present

## 2023-03-23 DIAGNOSIS — D649 Anemia, unspecified: Secondary | ICD-10-CM

## 2023-03-23 DIAGNOSIS — M65312 Trigger thumb, left thumb: Secondary | ICD-10-CM | POA: Diagnosis not present

## 2023-03-23 DIAGNOSIS — K7460 Unspecified cirrhosis of liver: Secondary | ICD-10-CM | POA: Insufficient documentation

## 2023-03-23 DIAGNOSIS — D693 Immune thrombocytopenic purpura: Secondary | ICD-10-CM | POA: Insufficient documentation

## 2023-03-23 LAB — CMP (CANCER CENTER ONLY)
ALT: 8 U/L (ref 0–44)
AST: 13 U/L — ABNORMAL LOW (ref 15–41)
Albumin: 3.8 g/dL (ref 3.5–5.0)
Alkaline Phosphatase: 119 U/L (ref 38–126)
Anion gap: 11 (ref 5–15)
BUN: 29 mg/dL — ABNORMAL HIGH (ref 8–23)
CO2: 32 mmol/L (ref 22–32)
Calcium: 9.9 mg/dL (ref 8.9–10.3)
Chloride: 98 mmol/L (ref 98–111)
Creatinine: 7.28 mg/dL (ref 0.44–1.00)
GFR, Estimated: 6 mL/min — ABNORMAL LOW (ref >60)
Glucose, Bld: 80 mg/dL (ref 70–99)
Potassium: 3.7 mmol/L (ref 3.5–5.1)
Sodium: 141 mmol/L (ref 135–145)
Total Bilirubin: 0.3 mg/dL (ref 0.0–1.2)
Total Protein: 7.8 g/dL (ref 6.5–8.1)

## 2023-03-23 LAB — IRON AND IRON BINDING CAPACITY (CC-WL,HP ONLY)
Iron: 41 ug/dL (ref 28–170)
Saturation Ratios: 15 % (ref 10.4–31.8)
TIBC: 272 ug/dL (ref 250–450)
UIBC: 231 ug/dL (ref 148–442)

## 2023-03-23 LAB — CBC WITH DIFFERENTIAL (CANCER CENTER ONLY)
Abs Immature Granulocytes: 0.02 10*3/uL (ref 0.00–0.07)
Basophils Absolute: 0 10*3/uL (ref 0.0–0.1)
Basophils Relative: 1 %
Eosinophils Absolute: 0.1 10*3/uL (ref 0.0–0.5)
Eosinophils Relative: 2 %
HCT: 33.2 % — ABNORMAL LOW (ref 36.0–46.0)
Hemoglobin: 10.6 g/dL — ABNORMAL LOW (ref 12.0–15.0)
Immature Granulocytes: 1 %
Lymphocytes Relative: 17 %
Lymphs Abs: 0.7 10*3/uL (ref 0.7–4.0)
MCH: 28.7 pg (ref 26.0–34.0)
MCHC: 31.9 g/dL (ref 30.0–36.0)
MCV: 90 fL (ref 80.0–100.0)
Monocytes Absolute: 0.4 10*3/uL (ref 0.1–1.0)
Monocytes Relative: 9 %
Neutro Abs: 2.8 10*3/uL (ref 1.7–7.7)
Neutrophils Relative %: 70 %
Platelet Count: 234 10*3/uL (ref 150–400)
RBC: 3.69 MIL/uL — ABNORMAL LOW (ref 3.87–5.11)
RDW: 15.9 % — ABNORMAL HIGH (ref 11.5–15.5)
WBC Count: 4 10*3/uL (ref 4.0–10.5)
nRBC: 0 % (ref 0.0–0.2)

## 2023-03-23 LAB — FOLATE: Folate: 18.4 ng/mL (ref >5.9)

## 2023-03-23 LAB — VITAMIN B12: Vitamin B-12: 982 pg/mL — ABNORMAL HIGH (ref 180–914)

## 2023-03-23 LAB — FERRITIN: Ferritin: 352 ng/mL — ABNORMAL HIGH (ref 11–307)

## 2023-03-23 NOTE — Telephone Encounter (Signed)
 CRITICAL VALUE STICKER  CRITICAL VALUE: Creatinine 7.28  RECEIVER (on-site recipient of call): Adalberto Ill, RN   DATE & TIME NOTIFIED: 03/23/23 1200  MESSENGER (representative from lab):Jessica   MD NOTIFIED: Laural Benes, PA  TIME OF NOTIFICATION: 03/23/23 1201  RESPONSE:

## 2023-03-24 DIAGNOSIS — D509 Iron deficiency anemia, unspecified: Secondary | ICD-10-CM | POA: Diagnosis not present

## 2023-03-24 DIAGNOSIS — N186 End stage renal disease: Secondary | ICD-10-CM | POA: Diagnosis not present

## 2023-03-24 DIAGNOSIS — N2581 Secondary hyperparathyroidism of renal origin: Secondary | ICD-10-CM | POA: Diagnosis not present

## 2023-03-24 DIAGNOSIS — D631 Anemia in chronic kidney disease: Secondary | ICD-10-CM | POA: Diagnosis not present

## 2023-03-24 DIAGNOSIS — Z992 Dependence on renal dialysis: Secondary | ICD-10-CM | POA: Diagnosis not present

## 2023-03-24 DIAGNOSIS — R519 Headache, unspecified: Secondary | ICD-10-CM | POA: Diagnosis not present

## 2023-03-26 DIAGNOSIS — D509 Iron deficiency anemia, unspecified: Secondary | ICD-10-CM | POA: Diagnosis not present

## 2023-03-26 DIAGNOSIS — N2581 Secondary hyperparathyroidism of renal origin: Secondary | ICD-10-CM | POA: Diagnosis not present

## 2023-03-26 DIAGNOSIS — N186 End stage renal disease: Secondary | ICD-10-CM | POA: Diagnosis not present

## 2023-03-26 DIAGNOSIS — Z992 Dependence on renal dialysis: Secondary | ICD-10-CM | POA: Diagnosis not present

## 2023-03-26 DIAGNOSIS — R519 Headache, unspecified: Secondary | ICD-10-CM | POA: Diagnosis not present

## 2023-03-26 DIAGNOSIS — D631 Anemia in chronic kidney disease: Secondary | ICD-10-CM | POA: Diagnosis not present

## 2023-03-29 DIAGNOSIS — N2581 Secondary hyperparathyroidism of renal origin: Secondary | ICD-10-CM | POA: Diagnosis not present

## 2023-03-29 DIAGNOSIS — N186 End stage renal disease: Secondary | ICD-10-CM | POA: Diagnosis not present

## 2023-03-29 DIAGNOSIS — D509 Iron deficiency anemia, unspecified: Secondary | ICD-10-CM | POA: Diagnosis not present

## 2023-03-29 DIAGNOSIS — Z992 Dependence on renal dialysis: Secondary | ICD-10-CM | POA: Diagnosis not present

## 2023-03-29 DIAGNOSIS — R519 Headache, unspecified: Secondary | ICD-10-CM | POA: Diagnosis not present

## 2023-03-29 DIAGNOSIS — D631 Anemia in chronic kidney disease: Secondary | ICD-10-CM | POA: Diagnosis not present

## 2023-03-30 ENCOUNTER — Inpatient Hospital Stay: Admitting: Physician Assistant

## 2023-03-30 DIAGNOSIS — D693 Immune thrombocytopenic purpura: Secondary | ICD-10-CM | POA: Diagnosis not present

## 2023-03-30 DIAGNOSIS — D649 Anemia, unspecified: Secondary | ICD-10-CM

## 2023-03-30 NOTE — Progress Notes (Signed)
 [facility] [phone]   [phone]  PROGRESS NOTE  Patient Care Team: Merri Brunette, MD as PCP - General Cyndie Chime Genene Churn, MD as Consulting Physician (Oncology) Early, Kristen Loader, MD (Inactive) as Consulting Physician (Vascular Surgery) Center, [facility], Daphine Deutscher, MD as Consulting Physician (Nephrology) Enedina Finner, MD as Referring Physician (Internal Medicine) Marlane Mingle, MD as Consulting Physician (Cardiology)  Hematological/Oncological History # ITP # Anemia in Setting of ESRD on Dialysis # Leukopenia in Setting of Cirrhosis 05/2017: treated with steroid taper and rituximab with Dr. Cyndie Chime 11/18/2021: last visit with Dr. Clelia Croft 04/12/2022: transfer care to Dr. Leonides Schanz   Interval History:  [name] 68 y.o. female with medical history significant for ITP who presents for a follow up visit. The patient's last visit was 12/29/2022. In the interim since the last visit she denies any changes to her health.   On exam today Mrs. Thomaston reports her energy has improved since the last visit. She still has fatigue but is able to complete her baseline ADLs. She has a good appetite and denies any significant weight changes. She denies nausea, vomiting or bowel habit changes. She is not having any trouble with bleeding, bruising, or dark stools.  Otherwise she denies any fevers, chills, sweats, chest pain or cough.  A full 10 point ROS is otherwise negative.  MEDICAL HISTORY:  Past Medical History:  Diagnosis Date   Achalasia    Anxiety    Dysplasia of cervix, low grade (CIN 1)    Environmental allergies    "all year long" (12/27/2016)   ESRD (end stage renal disease) on dialysis (HCC)    "TTS; [place]" (12/27/2016)   Fibromyalgia    GERD (gastroesophageal reflux disease)    Gout    Hypertension    IBS (irritable bowel syndrome)    MVP (mitral valve prolapse)    RA (rheumatoid arthritis) (HCC)    FOLLOWED BY DR.  SHANAHAN   Raynaud's disease    Scleroderma (HCC)    Seasonal allergies    Thrombocytopenia (HCC) 07/01/2016   Acute fall to 13,000 07/01/16   Tubular adenoma 01/08/2008   CECUM   Vitamin D deficiency     SURGICAL HISTORY: Past Surgical History:  Procedure Laterality Date   A/V FISTULAGRAM N/A 02/02/2023   Procedure: A/V Fistulagram;  Surgeon: Tyler Pita, MD;  Location: [facility] INV[facility]IVE [facility];  Service: Cardiovascular;  Laterality: N/A;   ANKLE FRACTURE SURGERY Right    AV FISTULA PLACEMENT Left 06/28/2016   Procedure: left arm ARTERIOVENOUS (AV) FISTULA CREATION;  Surgeon: Larina Earthly, MD;  Location: [facility] OR;  Service: Vascular;  Laterality: Left;   BASCILIC VEIN TRANSPOSITION Left 09/27/2016   Procedure: LEFT UPPER ARM CEPHALIC VEIN TRANSPOSITION;  Surgeon: Larina Earthly, MD;  Location: [facility] OR;  Service: Vascular;  Laterality: Left;   BREAST BIOPSY     "? side"   CESAREAN SECTION  1994   CO2 LASER OF CERVIX     COLONOSCOPY W/ BIOPSIES  01/08/2008   INSERTION OF DIALYSIS CATHETER Right 06/28/2016   Procedure: INSERTION OF DIALYSIS CATHETER, right internal jugular;  Surgeon: Larina Earthly, MD;  Location: [facility] OR;  Service: Vascular;  Laterality: Right;   MYOMECTOMY     NASAL ENDOSCOPY WITH EPISTAXIS CONTROL N/A 12/29/2019   Procedure: NASAL ENDOSCOPY WITH EPISTAXIS CONTROL;  Surgeon: Newman Pies, MD;  Location: [facility] OR;  Service: ENT;  Laterality: N/A;   PELVIC LAPAROSCOPY  2011   superficial thrombophlebitis  Left 07-2014    SOCIAL HISTORY: Social History   Socioeconomic History   Marital status: Married    Spouse name: Not on file   Number of children: Not on file   Years of education: Not on file   Highest education level: Not on file  Occupational History   Not on file  Tobacco Use   Smoking status: Never   Smokeless tobacco: Never  Vaping Use   Vaping status: Never Used  Substance and Sexual Activity   Alcohol use: No   Drug use: No   Sexual activity: Not Currently     Birth control/protection: Post-menopausal  Other Topics Concern   Not on file  Social History Narrative   Lives in [place]   Social Drivers of Health   Financial Resource Strain: Not on file  Food Insecurity: Low Risk  (03/23/2023)   Received from [facility]   Hunger Vital Sign    Worried About Running Out of Food in the Last Year: Never true    Ran Out of Food in the Last Year: Never true  Transportation Needs: No Transportation Needs (03/23/2023)   Received from [facility]    In the past 12 months, has lack of reliable transportation kept you from medical appointments, meetings, work or from getting things needed for daily living? : No  Physical Activity: Not on file  Stress: Not on file  Social Connections: Unknown (05/22/2021)   Received from [facility]   Social Network    Social Network: Not on file  Intimate Partner Violence: Not At Risk (06/24/2022)   Humiliation, Afraid, Rape, and Kick questionnaire    Fear of Current or Ex-Partner: No    Emotionally Abused: No    Physically Abused: No    Sexually Abused: No    FAMILY HISTORY: Family History  Problem Relation Age of Onset   Cerebral aneurysm Mother    Hypertension Mother    Diabetes Mother    Heart disease Father    Skin telangiectasia Maternal Aunt        possible telangiectasia, patient recalls reddish birthmarks   Heart disease Maternal Aunt    Diabetes Maternal Grandmother    Heart disease Paternal Grandfather    Cerebral palsy Cousin        1ST COUSIN?   Diabetes Paternal Grandmother    Heart attack Maternal Grandfather    Colon cancer Maternal Uncle        older than 66 yo at diagnosis   Cerebral palsy Other    Stomach cancer Paternal Uncle     ALLERGIES:  is allergic to other, nitrofuran derivatives, savella [milnacipran hcl], and tape.  MEDICATIONS:  Current Outpatient Medications  Medication Sig Dispense Refill   acetaminophen (TYLENOL) 325 MG tablet  Take 650 mg by mouth every 6 (six) hours as needed for mild pain (pain score 1-3), moderate pain (pain score 4-6) or headache.     albuterol (VENTOLIN HFA) 108 (90 Base) MCG/ACT inhaler Inhale 2 puffs into the lungs every 6 (six) hours as needed for wheezing or shortness of breath. 8 g 6   ambrisentan (LETAIRIS) 10 MG tablet Take 10 mg by mouth daily.     aspirin EC 81 MG tablet Take 1 tablet (81 mg total) by mouth daily. Swallow whole. 30 tablet 11   atorvastatin (LIPITOR) 80 MG tablet Take 1 tablet (80 mg total) by mouth daily. 90 tablet 3   azelastine (ASTELIN) 0.1 % nasal spray Place  1 spray into both nostrils 2 (two) times daily as needed for rhinitis. Use in each nostril as directed 30 mL 12   bisacodyl (DULCOLAX) 5 MG EC tablet Take 1 tablet (5 mg total) by mouth daily as needed for severe constipation. 10 tablet 0   calcium acetate (PHOSLO) 667 MG capsule Take 667 mg by mouth daily as needed (depending on phosphorus levels).     camphor-menthol (SARNA) lotion Apply topically 2 (two) times daily. (Patient taking differently: Apply 1 Application topically as needed for itching.) 222 mL 0   cinacalcet (SENSIPAR) 60 MG tablet Take 60 mg by mouth Every Tuesday,Thursday,and Saturday with dialysis.     clonazePAM (KLONOPIN) 0.5 MG tablet Take 1 tablet (0.5 mg total) by mouth 2 (two) times daily as needed for anxiety. 30 tablet 0   Cyanocobalamin (B-12 PO) Take 100 mcg by mouth daily.     cyclobenzaprine (FLEXERIL) 5 MG tablet Take 1 tablet (5 mg total) by mouth every 8 (eight) hours as needed for muscle spasms. (Patient taking differently: Take 10 mg by mouth every 8 (eight) hours as needed for muscle spasms.) 30 tablet 0   docusate sodium (COLACE) 100 MG capsule Take 2 capsules (200 mg total) by mouth daily. (Patient taking differently: Take 200 mg by mouth daily as needed for mild constipation or moderate constipation.) 60 capsule 0   famotidine (PEPCID) 20 MG tablet Take 1 tablet (20 mg total) by  mouth daily as needed for heartburn or indigestion. (Patient taking differently: Take 40 mg by mouth 2 (two) times daily as needed for heartburn or indigestion.) 30 tablet 0   fluticasone (FLONASE) 50 MCG/ACT nasal spray Place 1 spray into both nostrils daily for 7 days. (Patient taking differently: Place 1 spray into both nostrils daily as needed for allergies or rhinitis.) 15.8 mL 0   folic acid (FOLVITE) 1 MG tablet Take 1 tablet (1 mg total) by mouth daily. 90 tablet 2   gabapentin (NEURONTIN) 100 MG capsule Take 1 capsule (100 mg total) by mouth 3 (three) times daily. Take 1 capsule in morning and two at night (Patient taking differently: Take 100 mg by mouth daily as needed (Nerve pain).) 90 capsule 0   hydrOXYzine (ATARAX/VISTARIL) 25 MG tablet Take 25 mg by mouth every 8 (eight) hours as needed for itching.      hyoscyamine (ANASPAZ) 0.125 MG TBDP disintergrating tablet Place 0.125 mg under the tongue every 4 (four) hours as needed for cramping.     lanthanum (FOSRENOL) 1000 MG chewable tablet Chew 1,000-2,000 mg by mouth 3 (three) times daily with meals. Snack 1000 mg     levocetirizine (XYZAL) 5 MG tablet Take 5 mg by mouth every evening.     Methoxy PEG-Epoetin Beta (MIRCERA IJ) Mircera     multivitamin (RENA-VIT) TABS tablet Take 1 tablet by mouth daily at 6 (six) AM. (Patient not taking: Reported on 01/31/2023) 30 tablet 0   polyethylene glycol (MIRALAX / GLYCOLAX) 17 g packet Take 17 g by mouth 2 (two) times daily. (Patient taking differently: Take 15 g by mouth daily as needed for mild constipation or moderate constipation.) 14 each 0   Selexipag (UPTRAVI) 800 MCG TABS Take 1 tablet (800 mcg total) by mouth in the morning and at bedtime. 60 tablet 0   WINREVAIR subcutaneous injection Inject into the skin every 21 ( twenty-one) days.     No current facility-administered medications for this visit.    REVIEW OF SYSTEMS:   Constitutional: ( - )  fevers, ( - )  chills , ( - ) night  sweats Eyes: ( - ) blurriness of vision, ( - ) double vision, ( - ) watery eyes Ears, nose, mouth, throat, and face: ( - ) mucositis, ( - ) sore throat Respiratory: ( - ) cough, ( - ) dyspnea, ( - ) wheezes Cardiovascular: ( - ) palpitation, ( - ) chest discomfort, ( - ) lower extremity swelling Gastrointestinal:  ( - ) nausea, ( - ) heartburn, ( - ) change in bowel habits Skin: ( - ) abnormal skin rashes Lymphatics: ( - ) new lymphadenopathy, ( - ) easy bruising Neurological: ( - ) numbness, ( - ) tingling, ( - ) new weaknesses Behavioral/Psych: ( - ) mood change, ( - ) new changes  All other systems were reviewed with the patient and are negative.  PHYSICAL EXAMINATION: Not performed due to virtual visit.   LABORATORY DATA:  I have reviewed the data as listed    Latest Ref Rng & Units 03/23/2023   10:53 AM 12/29/2022    9:36 AM 10/29/2022    8:34 AM  CBC  WBC 4.0 - 10.5 K/uL 4.0  2.9  3.2   Hemoglobin 12.0 - 15.0 g/dL 69.6  8.8  29.5   Hematocrit 36.0 - 46.0 % 33.2  28.3  33.6   Platelets 150 - 400 K/uL 234  145  147        Latest Ref Rng & Units 03/23/2023   10:53 AM 12/29/2022    9:36 AM 10/29/2022    8:34 AM  CMP  Glucose 70 - 99 mg/dL 80  83  81   BUN 8 - 23 mg/dL 29  35  33   Creatinine 0.44 - 1.00 mg/dL 2.84  1.32  4.40   Sodium 135 - 145 mmol/L 141  137  139   Potassium 3.5 - 5.1 mmol/L 3.7  4.4  4.0   Chloride 98 - 111 mmol/L 98  95  96   CO2 22 - 32 mmol/L 32  33  33   Calcium 8.9 - 10.3 mg/dL 9.9  9.7  9.2   Total Protein 6.5 - 8.1 g/dL 7.8  7.2  7.4   Total Bilirubin 0.0 - 1.2 mg/dL 0.3  0.4  0.4   Alkaline Phos 38 - 126 U/L 119  130  116   AST 15 - 41 U/L 13  19  17    ALT 0 - 44 U/L 8  8  12     RADIOGRAPHIC STUDIES: No results found.  ASSESSMENT & PLAN JACKQULYN MENDEL is a 68 y.o. female with medical history significant for ITP who presents for a follow up visit.  # Normocytic anemia --Multifactorial 2/2 to ESRD, vitamin B12 and folate  deficiency. --Currently on PO vitamin B12 and folic acid supplementation --Workup from 04/12/2022 ruled out monoclonal gammopathy and iron deficiency. -- Agree with patient's nephrologist administering EPO shots with her dialysis.  Target hemoglobin greater than 10.0 --Labs from 03/23/2023 showed improvement of anemia with Hgb 10.6, MCV 90.0. Iron/B12/folate levels reviewed and show no deficiency.  --Patient plans to decrease B12 supplementation from daily to every other day.  -- Return to clinic in 3 months for labs only and 6 months for labs/follow up  # ITP -- Patient last required steroid therapy in May 2019. Also received rituximab at that time. -- Labs from 03/23/2023 showed Plt back to normal.  -- No clear indication for intervention at this time.  Would consider replacing steroid therapy in the event her platelets were to drop below 30.  # Leukopenia -- Stable over last several years, likely secondary to cirrhosis. -- Cirrhosis and ITP are likely reasons why platelets are mildly decreased. -- Labs from 03/23/2023 show WBC back to normal.    Orders Placed This Encounter  Procedures   CBC with Differential (Cancer Center Only)    Standing Status:   Future    Expected Date:   06/30/2023    Expiration Date:   03/29/2024   CMP (Cancer Center only)    Standing Status:   Future    Expected Date:   06/30/2023    Expiration Date:   03/29/2024   Ferritin    Standing Status:   Future    Expected Date:   06/30/2023    Expiration Date:   03/29/2024   Iron and Iron Binding Capacity ([facility] only)    Standing Status:   Future    Expected Date:   06/30/2023    Expiration Date:   03/29/2024   Vitamin B12    Standing Status:   Future    Expected Date:   06/30/2023    Expiration Date:   03/29/2024   Folate, Serum    Standing Status:   Future    Expected Date:   06/30/2023    Expiration Date:   03/29/2024   Folate, Serum    Standing Status:   Future    Expected Date:   06/30/2023    Expiration  Date:   03/29/2024    All questions were answered. The patient knows to call the clinic with any problems, questions or concerns.  I have spent a total of 25 minutes minutes of  non-face-to-face time, preparing to see the patient,  counseling and educating the patient, documenting clinical information in the electronic health record, independently interpreting results and communicating results to the patient, and care coordination.   Georga Kaufmann PA-C Dept of Hematology and Oncology [facility] Phone: [phone]    03/30/2023 8:55 AM

## 2023-03-31 DIAGNOSIS — N186 End stage renal disease: Secondary | ICD-10-CM | POA: Diagnosis not present

## 2023-03-31 DIAGNOSIS — D509 Iron deficiency anemia, unspecified: Secondary | ICD-10-CM | POA: Diagnosis not present

## 2023-03-31 DIAGNOSIS — N2581 Secondary hyperparathyroidism of renal origin: Secondary | ICD-10-CM | POA: Diagnosis not present

## 2023-03-31 DIAGNOSIS — Z992 Dependence on renal dialysis: Secondary | ICD-10-CM | POA: Diagnosis not present

## 2023-03-31 DIAGNOSIS — R519 Headache, unspecified: Secondary | ICD-10-CM | POA: Diagnosis not present

## 2023-03-31 DIAGNOSIS — D631 Anemia in chronic kidney disease: Secondary | ICD-10-CM | POA: Diagnosis not present

## 2023-04-02 DIAGNOSIS — R519 Headache, unspecified: Secondary | ICD-10-CM | POA: Diagnosis not present

## 2023-04-02 DIAGNOSIS — Z992 Dependence on renal dialysis: Secondary | ICD-10-CM | POA: Diagnosis not present

## 2023-04-02 DIAGNOSIS — D631 Anemia in chronic kidney disease: Secondary | ICD-10-CM | POA: Diagnosis not present

## 2023-04-02 DIAGNOSIS — D509 Iron deficiency anemia, unspecified: Secondary | ICD-10-CM | POA: Diagnosis not present

## 2023-04-02 DIAGNOSIS — N186 End stage renal disease: Secondary | ICD-10-CM | POA: Diagnosis not present

## 2023-04-02 DIAGNOSIS — N2581 Secondary hyperparathyroidism of renal origin: Secondary | ICD-10-CM | POA: Diagnosis not present

## 2023-04-05 DIAGNOSIS — I2721 Secondary pulmonary arterial hypertension: Secondary | ICD-10-CM | POA: Diagnosis not present

## 2023-04-05 DIAGNOSIS — M349 Systemic sclerosis, unspecified: Secondary | ICD-10-CM | POA: Diagnosis not present

## 2023-04-05 DIAGNOSIS — N2581 Secondary hyperparathyroidism of renal origin: Secondary | ICD-10-CM | POA: Diagnosis not present

## 2023-04-05 DIAGNOSIS — K116 Mucocele of salivary gland: Secondary | ICD-10-CM | POA: Diagnosis not present

## 2023-04-05 DIAGNOSIS — D631 Anemia in chronic kidney disease: Secondary | ICD-10-CM | POA: Diagnosis not present

## 2023-04-05 DIAGNOSIS — M0579 Rheumatoid arthritis with rheumatoid factor of multiple sites without organ or systems involvement: Secondary | ICD-10-CM | POA: Diagnosis not present

## 2023-04-05 DIAGNOSIS — M1A00X0 Idiopathic chronic gout, unspecified site, without tophus (tophi): Secondary | ICD-10-CM | POA: Diagnosis not present

## 2023-04-05 DIAGNOSIS — Z992 Dependence on renal dialysis: Secondary | ICD-10-CM | POA: Diagnosis not present

## 2023-04-05 DIAGNOSIS — D509 Iron deficiency anemia, unspecified: Secondary | ICD-10-CM | POA: Diagnosis not present

## 2023-04-05 DIAGNOSIS — R519 Headache, unspecified: Secondary | ICD-10-CM | POA: Diagnosis not present

## 2023-04-05 DIAGNOSIS — N186 End stage renal disease: Secondary | ICD-10-CM | POA: Diagnosis not present

## 2023-04-05 DIAGNOSIS — M359 Systemic involvement of connective tissue, unspecified: Secondary | ICD-10-CM | POA: Diagnosis not present

## 2023-04-06 DIAGNOSIS — M349 Systemic sclerosis, unspecified: Secondary | ICD-10-CM | POA: Diagnosis not present

## 2023-04-06 DIAGNOSIS — K5902 Outlet dysfunction constipation: Secondary | ICD-10-CM | POA: Diagnosis not present

## 2023-04-06 DIAGNOSIS — R198 Other specified symptoms and signs involving the digestive system and abdomen: Secondary | ICD-10-CM | POA: Diagnosis not present

## 2023-04-07 DIAGNOSIS — Z992 Dependence on renal dialysis: Secondary | ICD-10-CM | POA: Diagnosis not present

## 2023-04-07 DIAGNOSIS — N186 End stage renal disease: Secondary | ICD-10-CM | POA: Diagnosis not present

## 2023-04-07 DIAGNOSIS — D631 Anemia in chronic kidney disease: Secondary | ICD-10-CM | POA: Diagnosis not present

## 2023-04-07 DIAGNOSIS — R519 Headache, unspecified: Secondary | ICD-10-CM | POA: Diagnosis not present

## 2023-04-07 DIAGNOSIS — D509 Iron deficiency anemia, unspecified: Secondary | ICD-10-CM | POA: Diagnosis not present

## 2023-04-07 DIAGNOSIS — N2581 Secondary hyperparathyroidism of renal origin: Secondary | ICD-10-CM | POA: Diagnosis not present

## 2023-04-09 DIAGNOSIS — N186 End stage renal disease: Secondary | ICD-10-CM | POA: Diagnosis not present

## 2023-04-09 DIAGNOSIS — D631 Anemia in chronic kidney disease: Secondary | ICD-10-CM | POA: Diagnosis not present

## 2023-04-09 DIAGNOSIS — R519 Headache, unspecified: Secondary | ICD-10-CM | POA: Diagnosis not present

## 2023-04-09 DIAGNOSIS — N2581 Secondary hyperparathyroidism of renal origin: Secondary | ICD-10-CM | POA: Diagnosis not present

## 2023-04-09 DIAGNOSIS — Z992 Dependence on renal dialysis: Secondary | ICD-10-CM | POA: Diagnosis not present

## 2023-04-09 DIAGNOSIS — D509 Iron deficiency anemia, unspecified: Secondary | ICD-10-CM | POA: Diagnosis not present

## 2023-04-11 DIAGNOSIS — N186 End stage renal disease: Secondary | ICD-10-CM | POA: Diagnosis not present

## 2023-04-11 DIAGNOSIS — N269 Renal sclerosis, unspecified: Secondary | ICD-10-CM | POA: Diagnosis not present

## 2023-04-11 DIAGNOSIS — Z992 Dependence on renal dialysis: Secondary | ICD-10-CM | POA: Diagnosis not present

## 2023-04-12 DIAGNOSIS — N2581 Secondary hyperparathyroidism of renal origin: Secondary | ICD-10-CM | POA: Diagnosis not present

## 2023-04-12 DIAGNOSIS — N186 End stage renal disease: Secondary | ICD-10-CM | POA: Diagnosis not present

## 2023-04-12 DIAGNOSIS — Z992 Dependence on renal dialysis: Secondary | ICD-10-CM | POA: Diagnosis not present

## 2023-04-12 DIAGNOSIS — D631 Anemia in chronic kidney disease: Secondary | ICD-10-CM | POA: Diagnosis not present

## 2023-04-12 DIAGNOSIS — D509 Iron deficiency anemia, unspecified: Secondary | ICD-10-CM | POA: Diagnosis not present

## 2023-04-14 DIAGNOSIS — N2581 Secondary hyperparathyroidism of renal origin: Secondary | ICD-10-CM | POA: Diagnosis not present

## 2023-04-14 DIAGNOSIS — D509 Iron deficiency anemia, unspecified: Secondary | ICD-10-CM | POA: Diagnosis not present

## 2023-04-14 DIAGNOSIS — Z992 Dependence on renal dialysis: Secondary | ICD-10-CM | POA: Diagnosis not present

## 2023-04-14 DIAGNOSIS — D631 Anemia in chronic kidney disease: Secondary | ICD-10-CM | POA: Diagnosis not present

## 2023-04-14 DIAGNOSIS — N186 End stage renal disease: Secondary | ICD-10-CM | POA: Diagnosis not present

## 2023-04-16 DIAGNOSIS — N186 End stage renal disease: Secondary | ICD-10-CM | POA: Diagnosis not present

## 2023-04-16 DIAGNOSIS — Z992 Dependence on renal dialysis: Secondary | ICD-10-CM | POA: Diagnosis not present

## 2023-04-16 DIAGNOSIS — D509 Iron deficiency anemia, unspecified: Secondary | ICD-10-CM | POA: Diagnosis not present

## 2023-04-16 DIAGNOSIS — N2581 Secondary hyperparathyroidism of renal origin: Secondary | ICD-10-CM | POA: Diagnosis not present

## 2023-04-16 DIAGNOSIS — D631 Anemia in chronic kidney disease: Secondary | ICD-10-CM | POA: Diagnosis not present

## 2023-04-19 DIAGNOSIS — D631 Anemia in chronic kidney disease: Secondary | ICD-10-CM | POA: Diagnosis not present

## 2023-04-19 DIAGNOSIS — N2581 Secondary hyperparathyroidism of renal origin: Secondary | ICD-10-CM | POA: Diagnosis not present

## 2023-04-19 DIAGNOSIS — Z992 Dependence on renal dialysis: Secondary | ICD-10-CM | POA: Diagnosis not present

## 2023-04-19 DIAGNOSIS — D509 Iron deficiency anemia, unspecified: Secondary | ICD-10-CM | POA: Diagnosis not present

## 2023-04-19 DIAGNOSIS — N186 End stage renal disease: Secondary | ICD-10-CM | POA: Diagnosis not present

## 2023-04-20 DIAGNOSIS — Z992 Dependence on renal dialysis: Secondary | ICD-10-CM | POA: Diagnosis not present

## 2023-04-20 DIAGNOSIS — K760 Fatty (change of) liver, not elsewhere classified: Secondary | ICD-10-CM | POA: Diagnosis not present

## 2023-04-20 DIAGNOSIS — E1122 Type 2 diabetes mellitus with diabetic chronic kidney disease: Secondary | ICD-10-CM | POA: Diagnosis not present

## 2023-04-20 DIAGNOSIS — E0800 Diabetes mellitus due to underlying condition with hyperosmolarity without nonketotic hyperglycemic-hyperosmolar coma (NKHHC): Secondary | ICD-10-CM | POA: Diagnosis not present

## 2023-04-20 DIAGNOSIS — E7800 Pure hypercholesterolemia, unspecified: Secondary | ICD-10-CM | POA: Diagnosis not present

## 2023-04-20 DIAGNOSIS — N186 End stage renal disease: Secondary | ICD-10-CM | POA: Diagnosis not present

## 2023-04-20 DIAGNOSIS — M6289 Other specified disorders of muscle: Secondary | ICD-10-CM | POA: Diagnosis not present

## 2023-04-20 DIAGNOSIS — K7460 Unspecified cirrhosis of liver: Secondary | ICD-10-CM | POA: Diagnosis not present

## 2023-04-21 DIAGNOSIS — Z992 Dependence on renal dialysis: Secondary | ICD-10-CM | POA: Diagnosis not present

## 2023-04-21 DIAGNOSIS — D631 Anemia in chronic kidney disease: Secondary | ICD-10-CM | POA: Diagnosis not present

## 2023-04-21 DIAGNOSIS — N2581 Secondary hyperparathyroidism of renal origin: Secondary | ICD-10-CM | POA: Diagnosis not present

## 2023-04-21 DIAGNOSIS — N186 End stage renal disease: Secondary | ICD-10-CM | POA: Diagnosis not present

## 2023-04-21 DIAGNOSIS — D509 Iron deficiency anemia, unspecified: Secondary | ICD-10-CM | POA: Diagnosis not present

## 2023-04-22 ENCOUNTER — Other Ambulatory Visit: Payer: Self-pay | Admitting: Emergency Medicine

## 2023-04-23 DIAGNOSIS — N186 End stage renal disease: Secondary | ICD-10-CM | POA: Diagnosis not present

## 2023-04-23 DIAGNOSIS — D509 Iron deficiency anemia, unspecified: Secondary | ICD-10-CM | POA: Diagnosis not present

## 2023-04-23 DIAGNOSIS — D631 Anemia in chronic kidney disease: Secondary | ICD-10-CM | POA: Diagnosis not present

## 2023-04-23 DIAGNOSIS — Z992 Dependence on renal dialysis: Secondary | ICD-10-CM | POA: Diagnosis not present

## 2023-04-23 DIAGNOSIS — N2581 Secondary hyperparathyroidism of renal origin: Secondary | ICD-10-CM | POA: Diagnosis not present

## 2023-04-26 DIAGNOSIS — D509 Iron deficiency anemia, unspecified: Secondary | ICD-10-CM | POA: Diagnosis not present

## 2023-04-26 DIAGNOSIS — D631 Anemia in chronic kidney disease: Secondary | ICD-10-CM | POA: Diagnosis not present

## 2023-04-26 DIAGNOSIS — N186 End stage renal disease: Secondary | ICD-10-CM | POA: Diagnosis not present

## 2023-04-26 DIAGNOSIS — Z992 Dependence on renal dialysis: Secondary | ICD-10-CM | POA: Diagnosis not present

## 2023-04-26 DIAGNOSIS — N2581 Secondary hyperparathyroidism of renal origin: Secondary | ICD-10-CM | POA: Diagnosis not present

## 2023-04-27 DIAGNOSIS — I519 Heart disease, unspecified: Secondary | ICD-10-CM | POA: Diagnosis not present

## 2023-04-27 DIAGNOSIS — I350 Nonrheumatic aortic (valve) stenosis: Secondary | ICD-10-CM | POA: Diagnosis not present

## 2023-04-27 DIAGNOSIS — R0602 Shortness of breath: Secondary | ICD-10-CM | POA: Diagnosis not present

## 2023-04-27 DIAGNOSIS — R269 Unspecified abnormalities of gait and mobility: Secondary | ICD-10-CM | POA: Diagnosis not present

## 2023-04-27 DIAGNOSIS — G479 Sleep disorder, unspecified: Secondary | ICD-10-CM | POA: Diagnosis not present

## 2023-04-27 DIAGNOSIS — M359 Systemic involvement of connective tissue, unspecified: Secondary | ICD-10-CM | POA: Diagnosis not present

## 2023-04-27 DIAGNOSIS — Z992 Dependence on renal dialysis: Secondary | ICD-10-CM | POA: Diagnosis not present

## 2023-04-27 DIAGNOSIS — I2721 Secondary pulmonary arterial hypertension: Secondary | ICD-10-CM | POA: Diagnosis not present

## 2023-04-27 DIAGNOSIS — R0609 Other forms of dyspnea: Secondary | ICD-10-CM | POA: Diagnosis not present

## 2023-04-27 DIAGNOSIS — I69959 Hemiplegia and hemiparesis following unspecified cerebrovascular disease affecting unspecified side: Secondary | ICD-10-CM | POA: Diagnosis not present

## 2023-04-27 DIAGNOSIS — M349 Systemic sclerosis, unspecified: Secondary | ICD-10-CM | POA: Diagnosis not present

## 2023-04-27 DIAGNOSIS — N186 End stage renal disease: Secondary | ICD-10-CM | POA: Diagnosis not present

## 2023-04-27 DIAGNOSIS — R040 Epistaxis: Secondary | ICD-10-CM | POA: Diagnosis not present

## 2023-04-27 DIAGNOSIS — K5900 Constipation, unspecified: Secondary | ICD-10-CM | POA: Diagnosis not present

## 2023-04-27 DIAGNOSIS — R5383 Other fatigue: Secondary | ICD-10-CM | POA: Diagnosis not present

## 2023-04-28 DIAGNOSIS — D509 Iron deficiency anemia, unspecified: Secondary | ICD-10-CM | POA: Diagnosis not present

## 2023-04-28 DIAGNOSIS — N186 End stage renal disease: Secondary | ICD-10-CM | POA: Diagnosis not present

## 2023-04-28 DIAGNOSIS — Z992 Dependence on renal dialysis: Secondary | ICD-10-CM | POA: Diagnosis not present

## 2023-04-28 DIAGNOSIS — D631 Anemia in chronic kidney disease: Secondary | ICD-10-CM | POA: Diagnosis not present

## 2023-04-28 DIAGNOSIS — N2581 Secondary hyperparathyroidism of renal origin: Secondary | ICD-10-CM | POA: Diagnosis not present

## 2023-04-30 DIAGNOSIS — N186 End stage renal disease: Secondary | ICD-10-CM | POA: Diagnosis not present

## 2023-04-30 DIAGNOSIS — D631 Anemia in chronic kidney disease: Secondary | ICD-10-CM | POA: Diagnosis not present

## 2023-04-30 DIAGNOSIS — N2581 Secondary hyperparathyroidism of renal origin: Secondary | ICD-10-CM | POA: Diagnosis not present

## 2023-04-30 DIAGNOSIS — D509 Iron deficiency anemia, unspecified: Secondary | ICD-10-CM | POA: Diagnosis not present

## 2023-04-30 DIAGNOSIS — Z992 Dependence on renal dialysis: Secondary | ICD-10-CM | POA: Diagnosis not present

## 2023-05-03 DIAGNOSIS — D509 Iron deficiency anemia, unspecified: Secondary | ICD-10-CM | POA: Diagnosis not present

## 2023-05-03 DIAGNOSIS — Z992 Dependence on renal dialysis: Secondary | ICD-10-CM | POA: Diagnosis not present

## 2023-05-03 DIAGNOSIS — D631 Anemia in chronic kidney disease: Secondary | ICD-10-CM | POA: Diagnosis not present

## 2023-05-03 DIAGNOSIS — N2581 Secondary hyperparathyroidism of renal origin: Secondary | ICD-10-CM | POA: Diagnosis not present

## 2023-05-03 DIAGNOSIS — N186 End stage renal disease: Secondary | ICD-10-CM | POA: Diagnosis not present

## 2023-05-05 DIAGNOSIS — D631 Anemia in chronic kidney disease: Secondary | ICD-10-CM | POA: Diagnosis not present

## 2023-05-05 DIAGNOSIS — D509 Iron deficiency anemia, unspecified: Secondary | ICD-10-CM | POA: Diagnosis not present

## 2023-05-05 DIAGNOSIS — N186 End stage renal disease: Secondary | ICD-10-CM | POA: Diagnosis not present

## 2023-05-05 DIAGNOSIS — N2581 Secondary hyperparathyroidism of renal origin: Secondary | ICD-10-CM | POA: Diagnosis not present

## 2023-05-05 DIAGNOSIS — Z992 Dependence on renal dialysis: Secondary | ICD-10-CM | POA: Diagnosis not present

## 2023-05-06 ENCOUNTER — Telehealth: Payer: Self-pay

## 2023-05-06 NOTE — Telephone Encounter (Signed)
 Copied from CRM [id number]. Topic: Clinical - Medical Advice >> Apr 25, 2023  2:03 PM Alverda Joe S wrote: Reason for CRM: patient asking if doctor still wants her to have an xray done. Please call patient.  Spoke with pt. Advised pt come into ov early 4/30 to have x-ray performed per RB note in previous encounter. Pt verbalized understanding & had no concerns.

## 2023-05-07 DIAGNOSIS — Z992 Dependence on renal dialysis: Secondary | ICD-10-CM | POA: Diagnosis not present

## 2023-05-07 DIAGNOSIS — N186 End stage renal disease: Secondary | ICD-10-CM | POA: Diagnosis not present

## 2023-05-07 DIAGNOSIS — N2581 Secondary hyperparathyroidism of renal origin: Secondary | ICD-10-CM | POA: Diagnosis not present

## 2023-05-07 DIAGNOSIS — D631 Anemia in chronic kidney disease: Secondary | ICD-10-CM | POA: Diagnosis not present

## 2023-05-07 DIAGNOSIS — D509 Iron deficiency anemia, unspecified: Secondary | ICD-10-CM | POA: Diagnosis not present

## 2023-05-09 ENCOUNTER — Encounter (HOSPITAL_BASED_OUTPATIENT_CLINIC_OR_DEPARTMENT_OTHER)

## 2023-05-10 DIAGNOSIS — D631 Anemia in chronic kidney disease: Secondary | ICD-10-CM | POA: Diagnosis not present

## 2023-05-10 DIAGNOSIS — Z992 Dependence on renal dialysis: Secondary | ICD-10-CM | POA: Diagnosis not present

## 2023-05-10 DIAGNOSIS — N2581 Secondary hyperparathyroidism of renal origin: Secondary | ICD-10-CM | POA: Diagnosis not present

## 2023-05-10 DIAGNOSIS — N186 End stage renal disease: Secondary | ICD-10-CM | POA: Diagnosis not present

## 2023-05-10 DIAGNOSIS — D509 Iron deficiency anemia, unspecified: Secondary | ICD-10-CM | POA: Diagnosis not present

## 2023-05-11 ENCOUNTER — Encounter (HOSPITAL_BASED_OUTPATIENT_CLINIC_OR_DEPARTMENT_OTHER)

## 2023-05-11 ENCOUNTER — Ambulatory Visit: Admitting: Emergency Medicine

## 2023-05-11 DIAGNOSIS — N186 End stage renal disease: Secondary | ICD-10-CM | POA: Diagnosis not present

## 2023-05-11 DIAGNOSIS — Z992 Dependence on renal dialysis: Secondary | ICD-10-CM | POA: Diagnosis not present

## 2023-05-11 DIAGNOSIS — N269 Renal sclerosis, unspecified: Secondary | ICD-10-CM | POA: Diagnosis not present

## 2023-05-12 DIAGNOSIS — N186 End stage renal disease: Secondary | ICD-10-CM | POA: Diagnosis not present

## 2023-05-12 DIAGNOSIS — D509 Iron deficiency anemia, unspecified: Secondary | ICD-10-CM | POA: Diagnosis not present

## 2023-05-12 DIAGNOSIS — N2581 Secondary hyperparathyroidism of renal origin: Secondary | ICD-10-CM | POA: Diagnosis not present

## 2023-05-12 DIAGNOSIS — D631 Anemia in chronic kidney disease: Secondary | ICD-10-CM | POA: Diagnosis not present

## 2023-05-12 DIAGNOSIS — Z992 Dependence on renal dialysis: Secondary | ICD-10-CM | POA: Diagnosis not present

## 2023-05-13 ENCOUNTER — Ambulatory Visit: Admitting: Physical Therapy

## 2023-05-14 DIAGNOSIS — N186 End stage renal disease: Secondary | ICD-10-CM | POA: Diagnosis not present

## 2023-05-14 DIAGNOSIS — D509 Iron deficiency anemia, unspecified: Secondary | ICD-10-CM | POA: Diagnosis not present

## 2023-05-14 DIAGNOSIS — Z992 Dependence on renal dialysis: Secondary | ICD-10-CM | POA: Diagnosis not present

## 2023-05-14 DIAGNOSIS — N2581 Secondary hyperparathyroidism of renal origin: Secondary | ICD-10-CM | POA: Diagnosis not present

## 2023-05-14 DIAGNOSIS — D631 Anemia in chronic kidney disease: Secondary | ICD-10-CM | POA: Diagnosis not present

## 2023-05-16 DIAGNOSIS — J383 Other diseases of vocal cords: Secondary | ICD-10-CM | POA: Diagnosis not present

## 2023-05-16 DIAGNOSIS — R053 Chronic cough: Secondary | ICD-10-CM | POA: Diagnosis not present

## 2023-05-17 DIAGNOSIS — D509 Iron deficiency anemia, unspecified: Secondary | ICD-10-CM | POA: Diagnosis not present

## 2023-05-17 DIAGNOSIS — Z992 Dependence on renal dialysis: Secondary | ICD-10-CM | POA: Diagnosis not present

## 2023-05-17 DIAGNOSIS — N186 End stage renal disease: Secondary | ICD-10-CM | POA: Diagnosis not present

## 2023-05-17 DIAGNOSIS — D631 Anemia in chronic kidney disease: Secondary | ICD-10-CM | POA: Diagnosis not present

## 2023-05-17 DIAGNOSIS — N2581 Secondary hyperparathyroidism of renal origin: Secondary | ICD-10-CM | POA: Diagnosis not present

## 2023-05-19 DIAGNOSIS — D631 Anemia in chronic kidney disease: Secondary | ICD-10-CM | POA: Diagnosis not present

## 2023-05-19 DIAGNOSIS — D509 Iron deficiency anemia, unspecified: Secondary | ICD-10-CM | POA: Diagnosis not present

## 2023-05-19 DIAGNOSIS — Z992 Dependence on renal dialysis: Secondary | ICD-10-CM | POA: Diagnosis not present

## 2023-05-19 DIAGNOSIS — N2581 Secondary hyperparathyroidism of renal origin: Secondary | ICD-10-CM | POA: Diagnosis not present

## 2023-05-19 DIAGNOSIS — N186 End stage renal disease: Secondary | ICD-10-CM | POA: Diagnosis not present

## 2023-05-21 DIAGNOSIS — N2581 Secondary hyperparathyroidism of renal origin: Secondary | ICD-10-CM | POA: Diagnosis not present

## 2023-05-21 DIAGNOSIS — N186 End stage renal disease: Secondary | ICD-10-CM | POA: Diagnosis not present

## 2023-05-21 DIAGNOSIS — D509 Iron deficiency anemia, unspecified: Secondary | ICD-10-CM | POA: Diagnosis not present

## 2023-05-21 DIAGNOSIS — Z992 Dependence on renal dialysis: Secondary | ICD-10-CM | POA: Diagnosis not present

## 2023-05-21 DIAGNOSIS — D631 Anemia in chronic kidney disease: Secondary | ICD-10-CM | POA: Diagnosis not present

## 2023-05-24 DIAGNOSIS — Z992 Dependence on renal dialysis: Secondary | ICD-10-CM | POA: Diagnosis not present

## 2023-05-24 DIAGNOSIS — N186 End stage renal disease: Secondary | ICD-10-CM | POA: Diagnosis not present

## 2023-05-24 DIAGNOSIS — N2581 Secondary hyperparathyroidism of renal origin: Secondary | ICD-10-CM | POA: Diagnosis not present

## 2023-05-24 DIAGNOSIS — D509 Iron deficiency anemia, unspecified: Secondary | ICD-10-CM | POA: Diagnosis not present

## 2023-05-24 DIAGNOSIS — D631 Anemia in chronic kidney disease: Secondary | ICD-10-CM | POA: Diagnosis not present

## 2023-05-26 ENCOUNTER — Other Ambulatory Visit: Payer: Self-pay | Admitting: Hematology and Oncology

## 2023-05-26 DIAGNOSIS — N186 End stage renal disease: Secondary | ICD-10-CM | POA: Diagnosis not present

## 2023-05-26 DIAGNOSIS — Z992 Dependence on renal dialysis: Secondary | ICD-10-CM | POA: Diagnosis not present

## 2023-05-26 DIAGNOSIS — N2581 Secondary hyperparathyroidism of renal origin: Secondary | ICD-10-CM | POA: Diagnosis not present

## 2023-05-26 DIAGNOSIS — D509 Iron deficiency anemia, unspecified: Secondary | ICD-10-CM | POA: Diagnosis not present

## 2023-05-26 DIAGNOSIS — D631 Anemia in chronic kidney disease: Secondary | ICD-10-CM | POA: Diagnosis not present

## 2023-05-28 DIAGNOSIS — D631 Anemia in chronic kidney disease: Secondary | ICD-10-CM | POA: Diagnosis not present

## 2023-05-28 DIAGNOSIS — N2581 Secondary hyperparathyroidism of renal origin: Secondary | ICD-10-CM | POA: Diagnosis not present

## 2023-05-28 DIAGNOSIS — N186 End stage renal disease: Secondary | ICD-10-CM | POA: Diagnosis not present

## 2023-05-28 DIAGNOSIS — Z992 Dependence on renal dialysis: Secondary | ICD-10-CM | POA: Diagnosis not present

## 2023-05-28 DIAGNOSIS — D509 Iron deficiency anemia, unspecified: Secondary | ICD-10-CM | POA: Diagnosis not present

## 2023-05-30 ENCOUNTER — Other Ambulatory Visit: Payer: Self-pay | Admitting: *Deleted

## 2023-05-30 MED ORDER — FOLIC ACID 1 MG PO TABS: 1.0000 mg | ORAL_TABLET | Freq: Every day | ORAL | 2 refills | Status: AC

## 2023-05-31 DIAGNOSIS — N2581 Secondary hyperparathyroidism of renal origin: Secondary | ICD-10-CM | POA: Diagnosis not present

## 2023-05-31 DIAGNOSIS — D631 Anemia in chronic kidney disease: Secondary | ICD-10-CM | POA: Diagnosis not present

## 2023-05-31 DIAGNOSIS — D509 Iron deficiency anemia, unspecified: Secondary | ICD-10-CM | POA: Diagnosis not present

## 2023-05-31 DIAGNOSIS — N186 End stage renal disease: Secondary | ICD-10-CM | POA: Diagnosis not present

## 2023-05-31 DIAGNOSIS — Z992 Dependence on renal dialysis: Secondary | ICD-10-CM | POA: Diagnosis not present

## 2023-06-01 ENCOUNTER — Ambulatory Visit: Admitting: Emergency Medicine

## 2023-06-01 ENCOUNTER — Ambulatory Visit (HOSPITAL_BASED_OUTPATIENT_CLINIC_OR_DEPARTMENT_OTHER): Admitting: Emergency Medicine

## 2023-06-01 ENCOUNTER — Ambulatory Visit (INDEPENDENT_AMBULATORY_CARE_PROVIDER_SITE_OTHER)

## 2023-06-01 ENCOUNTER — Encounter: Payer: Self-pay | Admitting: Emergency Medicine

## 2023-06-01 VITALS — BP 95/60 | HR 93 | Ht 65.0 in | Wt 130.0 lb

## 2023-06-01 DIAGNOSIS — J301 Allergic rhinitis due to pollen: Secondary | ICD-10-CM

## 2023-06-01 DIAGNOSIS — J8410 Pulmonary fibrosis, unspecified: Secondary | ICD-10-CM

## 2023-06-01 DIAGNOSIS — J9611 Chronic respiratory failure with hypoxia: Secondary | ICD-10-CM

## 2023-06-01 DIAGNOSIS — J449 Chronic obstructive pulmonary disease, unspecified: Secondary | ICD-10-CM | POA: Diagnosis not present

## 2023-06-01 DIAGNOSIS — J849 Interstitial pulmonary disease, unspecified: Secondary | ICD-10-CM | POA: Diagnosis not present

## 2023-06-01 DIAGNOSIS — M359 Systemic involvement of connective tissue, unspecified: Secondary | ICD-10-CM | POA: Diagnosis not present

## 2023-06-01 DIAGNOSIS — I509 Heart failure, unspecified: Secondary | ICD-10-CM | POA: Diagnosis not present

## 2023-06-01 DIAGNOSIS — R918 Other nonspecific abnormal finding of lung field: Secondary | ICD-10-CM | POA: Diagnosis not present

## 2023-06-01 DIAGNOSIS — I2721 Secondary pulmonary arterial hypertension: Secondary | ICD-10-CM

## 2023-06-01 DIAGNOSIS — J929 Pleural plaque without asbestos: Secondary | ICD-10-CM | POA: Diagnosis not present

## 2023-06-01 LAB — PULMONARY FUNCTION TEST
DL/VA % pred: 66 %
DL/VA: 2.74 ml/min/mmHg/L
DLCO cor % pred: 44 %
DLCO cor: 9.21 ml/min/mmHg
DLCO unc % pred: 44 %
DLCO unc: 9.21 ml/min/mmHg
FEF 25-75 Post: 1.51 L/s
FEF 25-75 Pre: 0.86 L/s
FEF2575-%Change-Post: 75 %
FEF2575-%Pred-Post: 69 %
FEF2575-%Pred-Pre: 39 %
FEV1-%Change-Post: 17 %
FEV1-%Pred-Post: 54 %
FEV1-%Pred-Pre: 46 %
FEV1-Post: 1.37 L
FEV1-Pre: 1.16 L
FEV1FVC-%Change-Post: 7 %
FEV1FVC-%Pred-Pre: 95 %
FEV6-%Change-Post: 8 %
FEV6-%Pred-Post: 54 %
FEV6-%Pred-Pre: 50 %
FEV6-Post: 1.72 L
FEV6-Pre: 1.58 L
FEV6FVC-%Pred-Post: 104 %
FEV6FVC-%Pred-Pre: 104 %
FVC-%Change-Post: 8 %
FVC-%Pred-Post: 52 %
FVC-%Pred-Pre: 48 %
FVC-Post: 1.72 L
FVC-Pre: 1.58 L
Post FEV1/FVC ratio: 79 %
Post FEV6/FVC ratio: 100 %
Pre FEV1/FVC ratio: 74 %
Pre FEV6/FVC Ratio: 100 %
RV % pred: 91 %
RV: 1.96 L
TLC % pred: 73 %
TLC: 3.81 L

## 2023-06-01 NOTE — Patient Instructions (Addendum)
 We reviewed your chest x-ray and your pulmonary function testing today. We will try starting Stiolto 2 puffs once daily.  Keep track of how this helps your breathing so we can decide whether to continue it going forward Keep your albuterol  available use 2 puffs if needed for shortness of breath, chest tightness, wheezing. Use your oxygen  at 2-4 L/min depending on your level of exertion Continue your Xyzal , Flonase  and Astelin  nasal sprays as needed Continue your PAH meds as managed by [facility]: Ambrisentan , selexipag , Winrevair Follow in our office in 6 months so we can discuss how your breathing is doing on the new medication.  Please call sooner if you have any problems.

## 2023-06-01 NOTE — Patient Instructions (Signed)
 Full PFT performed today.

## 2023-06-01 NOTE — Assessment & Plan Note (Signed)
 Continue same regimen

## 2023-06-01 NOTE — Assessment & Plan Note (Signed)
 Chest x-ray stable today.  Plan to repeat a CT scan of the chest in 1 year

## 2023-06-01 NOTE — Assessment & Plan Note (Signed)
 Continue regimen as managed by [facility]: Ambrisentan , selexipag , Winrevair.  Follow-up with them regularly

## 2023-06-01 NOTE — Assessment & Plan Note (Signed)
 Continue supplemental oxygen , 2-4 L/min depending on her level of exertion

## 2023-06-01 NOTE — Assessment & Plan Note (Signed)
 Reviewed her PFT today, significant mixed disease with a restrictive component but also some obstruction.  Will do a trial of Stiolto to see if she gets benefit.  Keep her albuterol  available

## 2023-06-01 NOTE — Progress Notes (Signed)
 Subjective:    Patient ID: Katie Nunez, female    DOB: 05-13-1955, 68 y.o.   MRN: [id number]  Cough Pertinent negatives include no ear pain, eye redness, fever, headaches, postnasal drip, rash, rhinorrhea, sore throat, shortness of breath or wheezing.   ROV 06/01/2023 --Katie Nunez follows up today to review pulmonary function testing.  She is 8 with a history of rheumatoid arthritis, scleroderma, Raynaud's phenomenon and associated PAH.  She has end-stage renal disease on hemodialysis, ITP with CVA, portal vein thrombosis.  She is on Ambrisentan  and selexipag , and now on Winrevair sq injections, is followed at [facility].  She is on oxygen  at 2-4 L/min (4 L/min when she is on her POC).  She is on Xyzal ,, Flonase  as needed, Pepcid  as needed, Astelin  nasal spray as needed.  She has albuterol  which she uses rarely She will get SOB with walking She is having a lot of fatigue, esp w HD. She has to get up frequently due to GI issues, constipation.    Pulmonary function testing performed today and reviewed by me shows combined obstruction and restriction with severe reduction in her FEV1 at 46% predicted.  She has a positive bronchodilator response.  Lung volumes confirm restriction.  Her diffusion capacity is decreased.  Chest x-ray performed today and reviewed by me, shows hyperinflation with some focal right lower lobe linear scar, otherwise no infiltrates.   Review of Systems  Constitutional:  Negative for fever and unexpected weight change.  HENT:  Positive for congestion and sneezing. Negative for dental problem, ear pain, nosebleeds, postnasal drip, rhinorrhea, sinus pressure, sore throat and trouble swallowing.   Eyes:  Negative for redness and itching.  Respiratory:  Positive for cough. Negative for chest tightness, shortness of breath and wheezing.   Cardiovascular:  Negative for palpitations and leg swelling.  Gastrointestinal:  Negative for nausea and vomiting.  Genitourinary:  Negative  for dysuria.  Musculoskeletal:  Negative for joint swelling.  Skin:  Negative for rash.  Neurological:  Negative for headaches.  Hematological:  Does not bruise/bleed easily.  Psychiatric/Behavioral:  Negative for dysphoric mood. The patient is not nervous/anxious.         Objective:   Physical Exam Vitals:   06/01/23 1524  BP: 95/60  Pulse: 93  SpO2: 91%  Weight: 130 lb (59 kg)  Height: 5\' 5"  (1.651 m)    Gen: Pleasant, well-nourished, in no distress,  normal affect, in a wheelchair  ENT: No lesions,  mouth clear,  oropharynx clear, no postnasal drip  Neck: No JVD, no stridor  Lungs: No use of accessory muscles, decreased at bases, no wheeze or crackles.   Cardiovascular: RRR, heart sounds normal, no murmur or gallops, no peripheral edema  Musculoskeletal: No deformities, no cyanosis or clubbing  Neuro: alert, non focal  Skin: Warm, no lesions or rash     Assessment & Plan:  Obstructive lung disease (HCC) Reviewed her PFT today, significant mixed disease with a restrictive component but also some obstruction.  Will do a trial of Stiolto to see if she gets benefit.  Keep her albuterol  available  Postinflammatory pulmonary fibrosis (HCC) Chest x-ray stable today.  Plan to repeat a CT scan of the chest in 1 year  Allergic rhinitis Continue same regimen  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 (HCC) Continue regimen as managed by [facility]: Ambrisentan , selexipag , Winrevair.  Follow-up with them regularly  Chronic respiratory failure with hypoxia (HCC) Continue supplemental oxygen , 2-4 L/min depending on her level of  exertion   Katie Buddle, MD, PhD 06/01/2023, 3:42 PM [facility] [phone] or if no answer [phone]

## 2023-06-01 NOTE — Progress Notes (Signed)
 Full PFT performed today.

## 2023-06-02 DIAGNOSIS — Z992 Dependence on renal dialysis: Secondary | ICD-10-CM | POA: Diagnosis not present

## 2023-06-02 DIAGNOSIS — D509 Iron deficiency anemia, unspecified: Secondary | ICD-10-CM | POA: Diagnosis not present

## 2023-06-02 DIAGNOSIS — N2581 Secondary hyperparathyroidism of renal origin: Secondary | ICD-10-CM | POA: Diagnosis not present

## 2023-06-02 DIAGNOSIS — D631 Anemia in chronic kidney disease: Secondary | ICD-10-CM | POA: Diagnosis not present

## 2023-06-02 DIAGNOSIS — N186 End stage renal disease: Secondary | ICD-10-CM | POA: Diagnosis not present

## 2023-06-03 ENCOUNTER — Telehealth: Payer: Self-pay | Admitting: Emergency Medicine

## 2023-06-03 ENCOUNTER — Telehealth: Payer: Self-pay

## 2023-06-03 DIAGNOSIS — K5900 Constipation, unspecified: Secondary | ICD-10-CM | POA: Diagnosis not present

## 2023-06-03 DIAGNOSIS — K648 Other hemorrhoids: Secondary | ICD-10-CM | POA: Diagnosis not present

## 2023-06-03 NOTE — Telephone Encounter (Signed)
 Yes sounds good

## 2023-06-03 NOTE — Telephone Encounter (Signed)
 Patient was told to come pick up samples Tuesday as the office is closed on this upcoming Monday.

## 2023-06-03 NOTE — Telephone Encounter (Signed)
 Copied from CRM [id number]. Topic: Clinical - Medication Question >> Jun 03, 2023 12:41 PM Jethro Morrison wrote: Reason for CRM: PT IS CALLING ABOUT HER Stiolto SAMPLE THAT PROVIDER ADVISED HER OF BUT WAS NOT GIVEN ON THE DAY OF APPT. AND SHE IS NEAR THE OFFICE AND PICK THEM UP IF AVAILABLE.   We have samples of Stiolto 2.5. As long as the 2.5 is okay with Dr Baldwin Levee and the pt can be here by 2:30 pm, we can give her samples. I will route to Dr Baldwin Levee to advise if the 2.5 is okay first.

## 2023-06-03 NOTE — Telephone Encounter (Signed)
 ATC X1. LMTCB. I have 2 samples of Stolto 2.5 on my desk to give to pt when I receive a call back. The sample order has not been put into the chart or logged yet. I will hold until I hear back from pt.

## 2023-06-04 DIAGNOSIS — D509 Iron deficiency anemia, unspecified: Secondary | ICD-10-CM | POA: Diagnosis not present

## 2023-06-04 DIAGNOSIS — Z992 Dependence on renal dialysis: Secondary | ICD-10-CM | POA: Diagnosis not present

## 2023-06-04 DIAGNOSIS — D631 Anemia in chronic kidney disease: Secondary | ICD-10-CM | POA: Diagnosis not present

## 2023-06-04 DIAGNOSIS — N186 End stage renal disease: Secondary | ICD-10-CM | POA: Diagnosis not present

## 2023-06-04 DIAGNOSIS — N2581 Secondary hyperparathyroidism of renal origin: Secondary | ICD-10-CM | POA: Diagnosis not present

## 2023-06-07 DIAGNOSIS — Z992 Dependence on renal dialysis: Secondary | ICD-10-CM | POA: Diagnosis not present

## 2023-06-07 DIAGNOSIS — D631 Anemia in chronic kidney disease: Secondary | ICD-10-CM | POA: Diagnosis not present

## 2023-06-07 DIAGNOSIS — N2581 Secondary hyperparathyroidism of renal origin: Secondary | ICD-10-CM | POA: Diagnosis not present

## 2023-06-07 DIAGNOSIS — N186 End stage renal disease: Secondary | ICD-10-CM | POA: Diagnosis not present

## 2023-06-07 DIAGNOSIS — D509 Iron deficiency anemia, unspecified: Secondary | ICD-10-CM | POA: Diagnosis not present

## 2023-06-07 MED ORDER — STIOLTO RESPIMAT 2.5-2.5 MCG/ACT IN AERS: 2.0000 | INHALATION_SPRAY | Freq: Every day | RESPIRATORY_TRACT | Status: AC

## 2023-06-07 NOTE — Telephone Encounter (Signed)
 Atc x1. I was unable to lvm. 2 samples of Stiolto 2.5 mcg will be left up front for pt to pick up. Okay per Dr Baldwin Levee. NFN

## 2023-06-07 NOTE — Telephone Encounter (Signed)
 I called and spoke with the Katie Nunez  She will come in today and pick up the stiolto samples  [name] has logged these and I placed up front  Nothing further needed

## 2023-06-09 ENCOUNTER — Encounter: Payer: Self-pay | Admitting: Neurology

## 2023-06-09 ENCOUNTER — Ambulatory Visit: Admitting: Neurology

## 2023-06-09 VITALS — BP 98/50 | HR 82 | Ht 65.0 in | Wt 128.0 lb

## 2023-06-09 DIAGNOSIS — M792 Neuralgia and neuritis, unspecified: Secondary | ICD-10-CM | POA: Diagnosis not present

## 2023-06-09 DIAGNOSIS — Z992 Dependence on renal dialysis: Secondary | ICD-10-CM | POA: Diagnosis not present

## 2023-06-09 DIAGNOSIS — D631 Anemia in chronic kidney disease: Secondary | ICD-10-CM | POA: Diagnosis not present

## 2023-06-09 DIAGNOSIS — N2581 Secondary hyperparathyroidism of renal origin: Secondary | ICD-10-CM | POA: Diagnosis not present

## 2023-06-09 DIAGNOSIS — Z8673 Personal history of transient ischemic attack (TIA), and cerebral infarction without residual deficits: Secondary | ICD-10-CM

## 2023-06-09 DIAGNOSIS — G629 Polyneuropathy, unspecified: Secondary | ICD-10-CM

## 2023-06-09 DIAGNOSIS — D509 Iron deficiency anemia, unspecified: Secondary | ICD-10-CM | POA: Diagnosis not present

## 2023-06-09 DIAGNOSIS — N186 End stage renal disease: Secondary | ICD-10-CM | POA: Diagnosis not present

## 2023-06-09 MED ORDER — TOPIRAMATE 25 MG PO TABS: 25.0000 mg | ORAL_TABLET | Freq: Two times a day (BID) | ORAL | 3 refills | Status: AC

## 2023-06-09 NOTE — Patient Instructions (Signed)
 I had a long discussion with the patient  regarding her remote intraparenchymal hemorrhage and mild residual right hemiparesis and leg cramps, pain and paresthesias,  peripheral neuropathy  and neuropathic pain and answered questions.  I recommend she discontinue gabapentin  dose at night since she is having side effects.  Try Topamax  25 mg twice daily instead to help with neuropathic pain.   She will stay on aspirin  for stroke prevention and maintain aggressive risk factor modification strict control of hypertension blood pressure goal below 130/90, lipids with LDL cholesterol goal below 70 mg percent and diabetes with hemoglobin A1c goal below 6.5%.   I advised her to use her rollator at all times..  She will return for follow-up in the future in a year or call earlier if necessary.

## 2023-06-09 NOTE — Progress Notes (Signed)
 [facility] [address] [place]. [place] [address] [phone]       STROKE FOLLOW UP NOTE  [name] Date of Birth:  25-Jan-1955 Medical Record Number:  [id number]   Reason for Referral:  stroke follow up    SUBJECTIVE:   CHIEF COMPLAINT:  Chief Complaint  Patient presents with   Follow-up    RM 20, Pt w/spouse. Pt reports more neuropathic pain. Pt states takes gabapentin  100 mg when needed. Pt did have dialysis today. Pt reports some bowel issues - saw MD at [facility] for that about 2 mos ago.     HPI:  Update 06/09/2023 : She returns for follow-up after last visit 8 months ago.  She states she is doing fine.  She continues to have mild right-sided weakness and stiffness.  She is able to ambulate with a rollator.  She is careful and she has had no falls or injuries.  She complains of the rollator being heavy and is interested in getting a prescription for lighter 1.  Patient was prescribed gabapentin  300 mg at night at last visit to help with paresthesias at night.  She states that this makes her feel too tired and sleepy and she would like to stop it.  She has had no recurrent stroke or TIA symptoms.  She remains on aspirin  which she is tolerating well with minor bruising and no bleeding.  She is tolerating Lipitor  well without muscle aches and pains.  Last lipid profile checked on 04/20/23 showed LDL to be optimal at 66 mg percent.  She has no new complaints today. Update 09/01/2022 : She returns for follow-up after last visit 4 months ago.  She is accompanied by a female family member.  Patient states she continues to have significant neuropathic pain and paresthesias in her feet particularly at night.  She has been taking gabapentin  200 mg at night with use to initially help but is no longer helping as much.  The pain is more prominent when she is resting or sitting quietly with during the day it is not as noticeable.  Patient states she has been diagnosed by  hematologist with hereditary hemorrhagic telangiectasia.  She has had a few minute nasal bleeds and when she coughs hard sometimes she has blood-tinged sputum.  She denies any recurrent stroke or TIA symptoms.  She remains on aspirin  which is tolerating well without bruising or bleeding.  She is walks with a cane and a walker has had no recent falls.  She uses wheelchair for long distances.  Lab work at last visit on 04/28/2022 had shown low vitamin D  levels of 12.2 with normal TSH and other neuropathic panel labs.  LDL cholesterol was optimal at 59 mg percent and hemoglobin A1c at 5.4.  She had a carotid ultrasound on 05/19/2022 which showed no significant extracranial stenosis.  She has no other new complaints. Update 04/28/2022 : She returns for follow-up after last visit nearly a year ago.  She is accompanied by her son Katie Nunez.  Patient has been complaining of increasing bilateral leg pain and paresthesias from the calf down in both feet.  He also has intermittent burning as well as cramps in the feet.  He has been taking gabapentin  at last visit advised to take 100 mg in the morning and 200 at night however she has not been following the dysphonia 100 mg at night which seems to help.  She is scared to increase the dose because of fear of side  effects.  She spoke to her primary care physician who ordered a nerve conduction study which was done on 03/10/2022 by Dr. Eben Nunez which commonly found sensorimotor peripheral neuropathy with absent bilateral sural responses.  Chronic neurogenic denervation  Update 06/10/2021 ; she returns for follow-up after last visit 6 months ago.  She is accompanied by husband.  Patient is doing well and had no recurrent stroke or TIA symptoms.  She states her blood pressure is in fact been quite low and today is 90/52.  She had cardiac ablation and since then both the heart rate and blood pressures have been quite low.  She remains on aspirin  she is tolerating well without bruising or  patient does not have diabetes has not been evaluated for causes of neuropathy.  Gait and balance difficulties but is able to ambulate.  She admits her balance is poor but fortunately she has had no falls or injuries.  She remains on aspirin  for stroke prevention and has had no recurrent stroke or TIA symptoms.  She did see a cardiologist recently had an echocardiogram done yesterday.  She states her blood pressure has been running quite low following her ablation.  He is tolerating Lipitor  well without side effects last lipid profile on 05/3121 showed LDL to be 105 mg percent.  She she continues to have mild right-sided weakness and gait difficulties but does fairly well and uses a cane only for long distances.  She has had no falls or injuries.  She however complains of increased cramps in the legs particularly at night.  She does take gabapentin  100 twice daily but its not helping as much.  She has not tried higher doses yet.  She has Trental  listed as a medication for peripheral vascular disease but has not been taking it and is not sure she needs it.  Advised her to discuss this with her vascular surgeon.  She has not had lipid profile checked for quite some time. Update 12/02/2020 JM: Returns for 45-month stroke follow-up accompanied by her husband  Overall stable since prior visit -denies new stroke/TIA symptoms Residual right spastic hemiparesis - reports stable since prior visit. Some pain RLE distally. She questions possible restart of PT. Occasional use of Flexeril  and gabapentin  Continues to use a cane for ambulation -denies any recent falls Does report R>L foot numbness typically worse at night - this has been affecting her gait. She does have known scleroderma and questions if these symptoms could be part of that Currently working with PT on right sided neck/shoulder pain with some improvement - followed by orthopedics - considering use of cortisone injection per patient. Chronic issue but  worsened post stroke  Compliant on aspirin  - denies side effects Does not consistently take atorvastatin  - she did not feel this was needed. Denies side effects.  Blood pressure today 98/68 -routinely followed by nephrology and cardiology for blood pressure management  Routinely follows with GI for hepatic cirrhosis - reports recently dx'd with esophageal and rectal varices - has f/u 11/30 to further discuss Routinely follows with [facility] cardiology She is also followed by [facility] neurology  No further concerns at this time     History provided for reference purposes only Update 08/04/2020 Dr. Janett Medin She returns for follow-up after last visit with Camilo Cella our nurse practitioner 5 months ago.  She is doing well without recurrent stroke or TIA symptoms.  She continues to have right-sided weakness with is able to walk with a cane and has finished outpatient physical and  Occupational Therapy.  She still has mild right foot drop.  She has had no falls or injuries.  She has not started taking aspirin  or Eliquis  and has questions about her treatment options.  She had a follow-up CT scan of the brain done on 03/25/2020 which showed satisfactory resolution of the left frontal parenchymal hematoma without any acute abnormalities.  She continues to have low blood pressure with the systolic in the 80s ever since her diagnosis and treatment for pulmonary hypertension.  Today it is 80/57 but she denies having significant symptoms of orthostasis, tiredness and fatigue.  Initial visit 02/20/2020 JM: Ms. Towe is being seen for hospital follow-up accompanied by her husband. She was discharged home from CIR on 01/23/2020. She has been doing well since discharge without new stroke/TIA symptoms and reports residual right-sided weakness and gait impairment with continued improvement.  Currently working with neuro rehab PT/OT.  She does report increased tone in RUE muscle tightness which is typically worse at night.  She was  using Flexeril  during CIR with benefit and questions if this could be restarted.  Ambulates with cane and denies any recent falls.  She is not using assistive device PTA but was using rolling walker initially at discharge.  She remains on atorvastatin  40 mg daily without myalgias.  Blood pressure today 133/72.  BP routinely monitored at dialysis.  No further concerns at this time.  Stroke admission 12/12/2019 Ms. ASTARIA NANEZ is a 68 y.o. female with history of ESRD on HD TTS, fibromyalgia, hypertension, mixed connective tissue disorder, scleroderma, pulmonaryhypertension, atrial fibrillation, cirrhosis-on Coumadin for portal vein thrombosis and atrial fibrillation who presented to [facility] ED on 12/12/2019 with R sided weakness. Personally reviewed hospitalization pertinent progress notes, lab work and imaging with summary provided. Evaluated by Dr. Christiane Cowing with stroke work-up revealing left frontal IPH likely secondary to warfarin coagulopathy. Warfarin was reversed with vitamin K and Kcentra  with INR of 1.7 on admission and recommended follow-up outpatient to consider resuming warfarin or consider Eliquis  once ICH resolves. History of HTN stable. LDL 79 and recommend consideration of statin at discharge for follow-up in setting of hemorrhage. No history or evidence of DM with A1c 5.2. Other stroke risk factors include ESRD on HD and OSA. Other active problems include anemia of ESRD, metabolic bone disease, pulmonary hypertension, RA, scleroderma and fibromyalgia. Residual deficit of right hemiplegia. Evaluated by therapies and discharged to CIR on 12/18/2019 for ongoing therapy needs.  Stroke:   L frontal IPH d/t warfarin coagulopathy  CT head acute L frontal IPH w/ mild edema Repeat CT head stable L frontal IPH Repeat CT head unchanged L frontal IPH MRI  Subcortical L frontal white matter IPH w/ rim edema. few remote B hemorrhages upper cerebrum. MRA  Unremarkable  2D Echo EF 65 to 70% LDL 79 HgbA1c 5.2 VTE  prophylaxis - SCDs  warfarin daily prior to admission, now on No antithrombotic given IPH Therapy recommendations:  CIR Disposition:  CIR       ROS:   14 system review of systems performed and negative with exception of those listed in HPI  PMH:  Past Medical History:  Diagnosis Date   Achalasia    Anxiety    Dysplasia of cervix, low grade (CIN 1)    Environmental allergies    "all year long" (12/27/2016)   ESRD (end stage renal disease) on dialysis (HCC)    "TTS; [facility]" (12/27/2016)   Fibromyalgia    GERD (gastroesophageal reflux disease)  Gout    Hypertension    IBS (irritable bowel syndrome)    MVP (mitral valve prolapse)    RA (rheumatoid arthritis) (HCC)    FOLLOWED BY DR. SHANAHAN   Raynaud's disease    Scleroderma (HCC)    Seasonal allergies    Thrombocytopenia (HCC) 07/01/2016   Acute fall to 13,000 07/01/16   Tubular adenoma 01/08/2008   CECUM   Vitamin D  deficiency     PSH:  Past Surgical History:  Procedure Laterality Date   A/V FISTULAGRAM N/A 02/02/2023   Procedure: A/V Fistulagram;  Surgeon: Baron Border, MD;  Location: [facility] INVASIVE CV LAB;  Service: Cardiovascular;  Laterality: N/A;   ANKLE FRACTURE SURGERY Right    AV FISTULA PLACEMENT Left 06/28/2016   Procedure: left arm ARTERIOVENOUS (AV) FISTULA CREATION;  Surgeon: Mayo Speck, MD;  Location: [facility] OR;  Service: Vascular;  Laterality: Left;   BASCILIC VEIN TRANSPOSITION Left 09/27/2016   Procedure: LEFT UPPER ARM CEPHALIC VEIN TRANSPOSITION;  Surgeon: Mayo Speck, MD;  Location: [facility] OR;  Service: Vascular;  Laterality: Left;   BREAST BIOPSY     "? side"   CESAREAN SECTION  1994   CO2 LASER OF CERVIX     COLONOSCOPY W/ BIOPSIES  01/08/2008   INSERTION OF DIALYSIS CATHETER Right 06/28/2016   Procedure: INSERTION OF DIALYSIS CATHETER, right internal jugular;  Surgeon: Mayo Speck, MD;  Location: [facility] OR;  Service: Vascular;  Laterality: Right;   MYOMECTOMY     NASAL ENDOSCOPY WITH  EPISTAXIS CONTROL N/A 12/29/2019   Procedure: NASAL ENDOSCOPY WITH EPISTAXIS CONTROL;  Surgeon: Reynold Caves, MD;  Location: [facility] OR;  Service: ENT;  Laterality: N/A;   PELVIC LAPAROSCOPY  2011   superficial thrombophlebitis Left 07-2014    Social History:  Social History   Socioeconomic History   Marital status: Married    Spouse name: Not on file   Number of children: Not on file   Years of education: Not on file   Highest education level: Not on file  Occupational History   Not on file  Tobacco Use   Smoking status: Never   Smokeless tobacco: Never  Vaping Use   Vaping status: Never Used  Substance and Sexual Activity   Alcohol use: No   Drug use: No   Sexual activity: Not Currently    Birth control/protection: Post-menopausal  Other Topics Concern   Not on file  Social History Narrative   Lives in [place]   Social Drivers of Health   Financial Resource Strain: Not on file  Food Insecurity: Low Risk  (04/06/2023)   Received from [facility]   Hunger Vital Sign    Worried About Running Out of Food in the Last Year: Never true    Ran Out of Food in the Last Year: Never true  Transportation Needs: No Transportation Needs (04/06/2023)   Received from [facility]    In the past 12 months, has lack of reliable transportation kept you from medical appointments, meetings, work or from getting things needed for daily living? : No  Physical Activity: Not on file  Stress: Not on file  Social Connections: Unknown (05/22/2021)   [facility]   Social Network    Social Network: Not on file  Intimate Partner Violence: Not At Risk (06/24/2022)   Humiliation, Afraid, Rape, and Kick questionnaire    Fear of Current or Ex-Partner: No    Emotionally Abused: No  Physically Abused: No    Sexually Abused: No    Family History:  Family History  Problem Relation Age of Onset   Cerebral aneurysm Mother    Hypertension Mother     Diabetes Mother    Heart disease Father    Skin telangiectasia Maternal Aunt        possible telangiectasia, patient recalls reddish birthmarks   Heart disease Maternal Aunt    Diabetes Maternal Grandmother    Heart disease Paternal Grandfather    Cerebral palsy Cousin        1ST COUSIN?   Diabetes Paternal Grandmother    Heart attack Maternal Grandfather    Colon cancer Maternal Uncle        older than 31 yo at diagnosis   Cerebral palsy Other    Stomach cancer Paternal Uncle     Medications:   Current Outpatient Medications on File Prior to Visit  Medication Sig Dispense Refill   acetaminophen  (TYLENOL ) 325 MG tablet Take 650 mg by mouth every 6 (six) hours as needed for mild pain (pain score 1-3), moderate pain (pain score 4-6) or headache.     albuterol  (VENTOLIN  HFA) 108 (90 Base) MCG/ACT inhaler INHALE 2 PUFFS INTO THE LUNGS EVERY 6 HOURS AS NEEDED FOR WHEEZING OR SHORTNESS OF BREATH 6.7 g 11   ambrisentan  (LETAIRIS ) 10 MG tablet Take 10 mg by mouth daily.     aspirin  EC 81 MG tablet Take 1 tablet (81 mg total) by mouth daily. Swallow whole. 30 tablet 11   atorvastatin  (LIPITOR ) 80 MG tablet Take 1 tablet (80 mg total) by mouth daily. 90 tablet 3   azelastine  (ASTELIN ) 0.1 % nasal spray Place 1 spray into both nostrils 2 (two) times daily as needed for rhinitis. Use in each nostril as directed 30 mL 12   bisacodyl  (DULCOLAX) 5 MG EC tablet Take 1 tablet (5 mg total) by mouth daily as needed for severe constipation. 10 tablet 0   camphor-menthol  (SARNA) lotion Apply topically 2 (two) times daily. (Patient taking differently: Apply 1 Application topically as needed for itching.) 222 mL 0   cinacalcet  (SENSIPAR ) 60 MG tablet Take 60 mg by mouth Every Tuesday,Thursday,and Saturday with dialysis.     clonazePAM  (KLONOPIN ) 0.5 MG tablet Take 1 tablet (0.5 mg total) by mouth 2 (two) times daily as needed for anxiety. 30 tablet 0   Cyanocobalamin  (B-12 PO) Take 100 mcg by mouth daily.      cyclobenzaprine  (FLEXERIL ) 5 MG tablet Take 1 tablet (5 mg total) by mouth every 8 (eight) hours as needed for muscle spasms. (Patient taking differently: Take 10 mg by mouth every 8 (eight) hours as needed for muscle spasms.) 30 tablet 0   docusate sodium  (COLACE) 100 MG capsule Take 2 capsules (200 mg total) by mouth daily. (Patient taking differently: Take 200 mg by mouth daily as needed for mild constipation or moderate constipation.) 60 capsule 0   famotidine  (PEPCID ) 20 MG tablet Take 1 tablet (20 mg total) by mouth daily as needed for heartburn or indigestion. (Patient taking differently: Take 40 mg by mouth 2 (two) times daily as needed for heartburn or indigestion.) 30 tablet 0   folic acid  (FOLVITE ) 1 MG tablet Take 1 tablet (1 mg total) by mouth daily. 90 tablet 2   hydrOXYzine  (ATARAX /VISTARIL ) 25 MG tablet Take 25 mg by mouth every 8 (eight) hours as needed for itching.      hyoscyamine  (ANASPAZ ) 0.125 MG TBDP disintergrating tablet Place 0.125 mg  under the tongue every 4 (four) hours as needed for cramping.     lanthanum (FOSRENOL) 1000 MG chewable tablet Chew 1,000-2,000 mg by mouth 3 (three) times daily with meals. Snack 1000 mg     levocetirizine (XYZAL ) 5 MG tablet Take 5 mg by mouth every evening.     Methoxy PEG-Epoetin  Beta (MIRCERA IJ) Mircera     multivitamin (RENA-VIT) TABS tablet Take 1 tablet by mouth daily at 6 (six) AM. 30 tablet 0   polyethylene glycol (MIRALAX  / GLYCOLAX ) 17 g packet Take 17 g by mouth 2 (two) times daily. (Patient taking differently: Take 15 g by mouth daily as needed for mild constipation or moderate constipation.) 14 each 0   Selexipag  (UPTRAVI ) 800 MCG TABS Take 1 tablet (800 mcg total) by mouth in the morning and at bedtime. 60 tablet 0   Tiotropium Bromide-Olodaterol (STIOLTO RESPIMAT) 2.5-2.5 MCG/ACT AERS Inhale 2 puffs into the lungs daily.     WINREVAIR subcutaneous injection Inject into the skin every 21 ( twenty-one) days.     calcium  acetate  (PHOSLO ) 667 MG capsule Take 667 mg by mouth daily as needed (depending on phosphorus levels). (Patient not taking: Reported on 06/09/2023)     fluticasone  (FLONASE ) 50 MCG/ACT nasal spray Place 1 spray into both nostrils daily for 7 days. (Patient taking differently: Place 1 spray into both nostrils daily as needed for allergies or rhinitis.) 15.8 mL 0   gabapentin  (NEURONTIN ) 100 MG capsule Take 1 capsule (100 mg total) by mouth 3 (three) times daily. Take 1 capsule in morning and two at night (Patient taking differently: Take 100 mg by mouth daily as needed (Nerve pain).) 90 capsule 0   No current facility-administered medications on file prior to visit.    Allergies:   Allergies  Allergen Reactions   Other Anaphylaxis and Other (See Comments)    Do not use polyflux membrane.  Use alternate heart racing   Nitrofuran Derivatives     Advised not to take   Savella [Milnacipran Hcl] Palpitations and Other (See Comments)    Unknown   Tape Rash and Other (See Comments)    Itch- unsure if it was paper or adhesive tape      OBJECTIVE:  Physical Exam  Vitals:   06/09/23 1358  BP: (!) 98/50  Pulse: 82  Weight: 128 lb (58.1 kg)  Height: 5\' 5"  (1.651 m)   Body mass index is 21.3 kg/m. No results found.  General: Frail very pleasant middle-aged African-American female, seated, in no evident distress Head: head normocephalic and atraumatic.   Neck: supple with no carotid or supraclavicular bruits Cardiovascular: regular rate and rhythm, soft ejection systolic murmur throughout precordium Musculoskeletal: no deformity Skin:  no rash/petichiae; +bruit and +thrill LUE fistula Vascular:  Normal pulses all extremities   Neurologic Exam Mental Status: Awake and fully alert.  Fluent speech and language. Oriented to place and time. Recent and remote memory intact. Attention span, concentration and fund of knowledge appropriate. Mood and affect appropriate.  Cranial Nerves: Pupils equal,  briskly reactive to light. Extraocular movements full without nystagmus. Visual fields full to confrontation. Hearing intact. Facial sensation intact. Face, tongue, palate moves normally and symmetrically.  Motor: Normal bulk and tone and strength LUE and LLE.  RUE: 4/5 with slightly decreased hand dexterity and increased tone; RLE: 4/5 hip flexor and ankle dorsiflexion and mildly increased tone distally Sensory.: hyperesthesia touch , pinprick in both legs from calf down.  Preserved, position and vibratory sensation.  Coordination: Rapid alternating movements normal in all extremities except right hand. Finger-to-nose and heel-to-shin performed accurately bilaterally Gait and Station: Arises from chair without difficulty. Stance is normal. Gait is slightly broad-based and demonstrates mild spastic hemiplegic gait with mild circumduction foot drop on the right.  With use of cane.  Tandem walk and heel toe not attempted Reflexes: 2+ RUE and RLE; 1+ LUE and LLE. Toes downgoing.         ASSESSMENT: MCKENLEE MANGHAM is a 68 y.o. year old female presented with right-sided weakness on 12/12/2019 with stroke work-up revealing left frontal IPH secondary to warfarin coagulopathy. Vascular risk factors include HTN, HLD, atrial fibrillation s/p ablation, portal vein thrombosis, pulmonary hypertension, rheumatoid arthritis, chronic right-sided heart failure, scleroderma and ESRD on HD.  Patient is doing well but continues to have mild spastic right hemiparesis .  Persistent complaints of leg cramps, pain and paresthesia from newly diagnosed peripheral neuropathy of undetermined etiology which has not responded well to gabapentin .     PLAN: I had a long discussion with the patient  regarding her remote intraparenchymal hemorrhage and mild residual right hemiparesis and leg cramps, pain and paresthesias,  peripheral neuropathy  and neuropathic pain and answered questions.  I recommend she discontinue gabapentin   dose at night since she is having side effects.  Try Topamax 25 mg twice daily instead to help with neuropathic pain.   She will stay on aspirin  for stroke prevention and maintain aggressive risk factor modification strict control of hypertension blood pressure goal below 130/90, lipids with LDL cholesterol goal below 70 mg percent and diabetes with hemoglobin A1c goal below 6.5%.   I advised her to use her rollator at all times..  She will return for follow-up in the future in a year or call earlier if necessary.  Greater than 50% time during this 35-minute visit was spent on counseling and coordination of care about her cramps and post hemorrhage spasticity and answering questions.  Katie Beaver, MD [facility] [address] [place], [place] [address]  Phone [phone] Fax [phone] Note: This document was prepared with digital dictation and possible smart phrase technology. Any transcriptional errors that result from this process are unintentional.

## 2023-06-10 ENCOUNTER — Telehealth: Payer: Self-pay | Admitting: Neurology

## 2023-06-10 ENCOUNTER — Ambulatory Visit: Payer: Self-pay | Admitting: Emergency Medicine

## 2023-06-10 NOTE — Telephone Encounter (Signed)
 Assisted patient with information of how to assemble her Stiolto inhaler. Patient was guided to drug company website of stiolto that has a how to video of setting up her inhaler. Patient verbalized understanding and all questions answered. Patient was instructed to bookmark the website for future times of having to put inhaler together.    Copied from CRM [id number]. Topic: Clinical - Medication Question >> Jun 10, 2023  3:44 PM Margarette Shawl wrote: Reason for CRM:   Pt is needing assistance on using Stiolto Respimate inhaler. She is unsure of steps to set up inhaler and how to apply medication.   Requests call back  #[phone] Reason for Disposition  Health Information question, no triage required and triager able to answer question  Answer Assessment - Initial Assessment Questions 1. REASON FOR CALL or QUESTION: "What is your reason for calling today?" or "How can I best help you?" or "What question do you have that I can help answer?"     Patient called needing assistance with assembly for her Stiolto Respimat  inhaler. Patient states she got samples from the office but forgot to ask for help to assemble.  Protocols used: Information Only Call - No Triage-A-AH

## 2023-06-10 NOTE — Telephone Encounter (Signed)
 Pt called wanting to know if an order for a Rolator has been put in for herl.

## 2023-06-11 DIAGNOSIS — N269 Renal sclerosis, unspecified: Secondary | ICD-10-CM | POA: Diagnosis not present

## 2023-06-11 DIAGNOSIS — N2581 Secondary hyperparathyroidism of renal origin: Secondary | ICD-10-CM | POA: Diagnosis not present

## 2023-06-11 DIAGNOSIS — I50812 Chronic right heart failure: Secondary | ICD-10-CM | POA: Diagnosis not present

## 2023-06-11 DIAGNOSIS — D509 Iron deficiency anemia, unspecified: Secondary | ICD-10-CM | POA: Diagnosis not present

## 2023-06-11 DIAGNOSIS — D631 Anemia in chronic kidney disease: Secondary | ICD-10-CM | POA: Diagnosis not present

## 2023-06-11 DIAGNOSIS — I10 Essential (primary) hypertension: Secondary | ICD-10-CM | POA: Diagnosis not present

## 2023-06-11 DIAGNOSIS — Z992 Dependence on renal dialysis: Secondary | ICD-10-CM | POA: Diagnosis not present

## 2023-06-11 DIAGNOSIS — N186 End stage renal disease: Secondary | ICD-10-CM | POA: Diagnosis not present

## 2023-06-11 DIAGNOSIS — E782 Mixed hyperlipidemia: Secondary | ICD-10-CM | POA: Diagnosis not present

## 2023-06-13 NOTE — Telephone Encounter (Signed)
 Spoke w/Pt to make her aware the order has been sent to DME provider [employer] and once order is processed they will reach out to her. Pt voiced understanding.

## 2023-06-13 NOTE — Addendum Note (Signed)
 Addended by: Breiona Couvillon K on: 06/13/2023 08:01 AM   Modules accepted: Orders

## 2023-06-14 DIAGNOSIS — N186 End stage renal disease: Secondary | ICD-10-CM | POA: Diagnosis not present

## 2023-06-14 DIAGNOSIS — N2581 Secondary hyperparathyroidism of renal origin: Secondary | ICD-10-CM | POA: Diagnosis not present

## 2023-06-14 DIAGNOSIS — Z992 Dependence on renal dialysis: Secondary | ICD-10-CM | POA: Diagnosis not present

## 2023-06-14 DIAGNOSIS — R519 Headache, unspecified: Secondary | ICD-10-CM | POA: Diagnosis not present

## 2023-06-14 DIAGNOSIS — D631 Anemia in chronic kidney disease: Secondary | ICD-10-CM | POA: Diagnosis not present

## 2023-06-14 DIAGNOSIS — D509 Iron deficiency anemia, unspecified: Secondary | ICD-10-CM | POA: Diagnosis not present

## 2023-06-16 DIAGNOSIS — D631 Anemia in chronic kidney disease: Secondary | ICD-10-CM | POA: Diagnosis not present

## 2023-06-16 DIAGNOSIS — R519 Headache, unspecified: Secondary | ICD-10-CM | POA: Diagnosis not present

## 2023-06-16 DIAGNOSIS — D509 Iron deficiency anemia, unspecified: Secondary | ICD-10-CM | POA: Diagnosis not present

## 2023-06-16 DIAGNOSIS — Z992 Dependence on renal dialysis: Secondary | ICD-10-CM | POA: Diagnosis not present

## 2023-06-16 DIAGNOSIS — N186 End stage renal disease: Secondary | ICD-10-CM | POA: Diagnosis not present

## 2023-06-16 DIAGNOSIS — N2581 Secondary hyperparathyroidism of renal origin: Secondary | ICD-10-CM | POA: Diagnosis not present

## 2023-06-18 DIAGNOSIS — R519 Headache, unspecified: Secondary | ICD-10-CM | POA: Diagnosis not present

## 2023-06-18 DIAGNOSIS — N186 End stage renal disease: Secondary | ICD-10-CM | POA: Diagnosis not present

## 2023-06-18 DIAGNOSIS — D631 Anemia in chronic kidney disease: Secondary | ICD-10-CM | POA: Diagnosis not present

## 2023-06-18 DIAGNOSIS — N2581 Secondary hyperparathyroidism of renal origin: Secondary | ICD-10-CM | POA: Diagnosis not present

## 2023-06-18 DIAGNOSIS — D509 Iron deficiency anemia, unspecified: Secondary | ICD-10-CM | POA: Diagnosis not present

## 2023-06-18 DIAGNOSIS — Z992 Dependence on renal dialysis: Secondary | ICD-10-CM | POA: Diagnosis not present

## 2023-06-21 DIAGNOSIS — D631 Anemia in chronic kidney disease: Secondary | ICD-10-CM | POA: Diagnosis not present

## 2023-06-21 DIAGNOSIS — D509 Iron deficiency anemia, unspecified: Secondary | ICD-10-CM | POA: Diagnosis not present

## 2023-06-21 DIAGNOSIS — Z992 Dependence on renal dialysis: Secondary | ICD-10-CM | POA: Diagnosis not present

## 2023-06-21 DIAGNOSIS — N186 End stage renal disease: Secondary | ICD-10-CM | POA: Diagnosis not present

## 2023-06-21 DIAGNOSIS — N2581 Secondary hyperparathyroidism of renal origin: Secondary | ICD-10-CM | POA: Diagnosis not present

## 2023-06-21 DIAGNOSIS — R519 Headache, unspecified: Secondary | ICD-10-CM | POA: Diagnosis not present

## 2023-06-23 DIAGNOSIS — N186 End stage renal disease: Secondary | ICD-10-CM | POA: Diagnosis not present

## 2023-06-23 DIAGNOSIS — D631 Anemia in chronic kidney disease: Secondary | ICD-10-CM | POA: Diagnosis not present

## 2023-06-23 DIAGNOSIS — Z992 Dependence on renal dialysis: Secondary | ICD-10-CM | POA: Diagnosis not present

## 2023-06-23 DIAGNOSIS — D509 Iron deficiency anemia, unspecified: Secondary | ICD-10-CM | POA: Diagnosis not present

## 2023-06-23 DIAGNOSIS — R519 Headache, unspecified: Secondary | ICD-10-CM | POA: Diagnosis not present

## 2023-06-23 DIAGNOSIS — N2581 Secondary hyperparathyroidism of renal origin: Secondary | ICD-10-CM | POA: Diagnosis not present

## 2023-06-25 DIAGNOSIS — D509 Iron deficiency anemia, unspecified: Secondary | ICD-10-CM | POA: Diagnosis not present

## 2023-06-25 DIAGNOSIS — Z992 Dependence on renal dialysis: Secondary | ICD-10-CM | POA: Diagnosis not present

## 2023-06-25 DIAGNOSIS — R519 Headache, unspecified: Secondary | ICD-10-CM | POA: Diagnosis not present

## 2023-06-25 DIAGNOSIS — N186 End stage renal disease: Secondary | ICD-10-CM | POA: Diagnosis not present

## 2023-06-25 DIAGNOSIS — D631 Anemia in chronic kidney disease: Secondary | ICD-10-CM | POA: Diagnosis not present

## 2023-06-25 DIAGNOSIS — N2581 Secondary hyperparathyroidism of renal origin: Secondary | ICD-10-CM | POA: Diagnosis not present

## 2023-06-28 DIAGNOSIS — R519 Headache, unspecified: Secondary | ICD-10-CM | POA: Diagnosis not present

## 2023-06-28 DIAGNOSIS — D509 Iron deficiency anemia, unspecified: Secondary | ICD-10-CM | POA: Diagnosis not present

## 2023-06-28 DIAGNOSIS — D631 Anemia in chronic kidney disease: Secondary | ICD-10-CM | POA: Diagnosis not present

## 2023-06-28 DIAGNOSIS — N2581 Secondary hyperparathyroidism of renal origin: Secondary | ICD-10-CM | POA: Diagnosis not present

## 2023-06-28 DIAGNOSIS — Z992 Dependence on renal dialysis: Secondary | ICD-10-CM | POA: Diagnosis not present

## 2023-06-28 DIAGNOSIS — N186 End stage renal disease: Secondary | ICD-10-CM | POA: Diagnosis not present

## 2023-06-29 ENCOUNTER — Telehealth: Payer: Self-pay

## 2023-06-29 ENCOUNTER — Encounter: Payer: Self-pay | Admitting: Hematology and Oncology

## 2023-06-29 ENCOUNTER — Inpatient Hospital Stay: Attending: Physician Assistant

## 2023-06-29 DIAGNOSIS — D72819 Decreased white blood cell count, unspecified: Secondary | ICD-10-CM | POA: Insufficient documentation

## 2023-06-29 DIAGNOSIS — D693 Immune thrombocytopenic purpura: Secondary | ICD-10-CM | POA: Insufficient documentation

## 2023-06-29 DIAGNOSIS — D649 Anemia, unspecified: Secondary | ICD-10-CM | POA: Diagnosis not present

## 2023-06-29 DIAGNOSIS — N186 End stage renal disease: Secondary | ICD-10-CM | POA: Insufficient documentation

## 2023-06-29 DIAGNOSIS — E538 Deficiency of other specified B group vitamins: Secondary | ICD-10-CM | POA: Diagnosis not present

## 2023-06-29 DIAGNOSIS — K7460 Unspecified cirrhosis of liver: Secondary | ICD-10-CM | POA: Diagnosis not present

## 2023-06-29 LAB — CMP (CANCER CENTER ONLY)
ALT: 11 U/L (ref 0–44)
AST: 17 U/L (ref 15–41)
Albumin: 3.6 g/dL (ref 3.5–5.0)
Alkaline Phosphatase: 98 U/L (ref 38–126)
Anion gap: 9 (ref 5–15)
BUN: 34 mg/dL — ABNORMAL HIGH (ref 8–23)
CO2: 31 mmol/L (ref 22–32)
Calcium: 9.2 mg/dL (ref 8.9–10.3)
Chloride: 103 mmol/L (ref 98–111)
Creatinine: 7.45 mg/dL (ref 0.44–1.00)
GFR, Estimated: 6 mL/min — ABNORMAL LOW (ref >60)
Glucose, Bld: 84 mg/dL (ref 70–99)
Potassium: 4 mmol/L (ref 3.5–5.1)
Sodium: 143 mmol/L (ref 135–145)
Total Bilirubin: 0.3 mg/dL (ref 0.0–1.2)
Total Protein: 6.9 g/dL (ref 6.5–8.1)

## 2023-06-29 LAB — CBC WITH DIFFERENTIAL (CANCER CENTER ONLY)
Abs Immature Granulocytes: 0.11 10*3/uL — ABNORMAL HIGH (ref 0.00–0.07)
Basophils Absolute: 0.1 10*3/uL (ref 0.0–0.1)
Basophils Relative: 1 %
Eosinophils Absolute: 0.1 10*3/uL (ref 0.0–0.5)
Eosinophils Relative: 2 %
HCT: 29.4 % — ABNORMAL LOW (ref 36.0–46.0)
Hemoglobin: 9.2 g/dL — ABNORMAL LOW (ref 12.0–15.0)
Immature Granulocytes: 3 %
Lymphocytes Relative: 18 %
Lymphs Abs: 0.7 10*3/uL (ref 0.7–4.0)
MCH: 28.3 pg (ref 26.0–34.0)
MCHC: 31.3 g/dL (ref 30.0–36.0)
MCV: 90.5 fL (ref 80.0–100.0)
Monocytes Absolute: 0.5 10*3/uL (ref 0.1–1.0)
Monocytes Relative: 13 %
Neutro Abs: 2.4 10*3/uL (ref 1.7–7.7)
Neutrophils Relative %: 63 %
Platelet Count: 177 10*3/uL (ref 150–400)
RBC: 3.25 MIL/uL — ABNORMAL LOW (ref 3.87–5.11)
RDW: 17.9 % — ABNORMAL HIGH (ref 11.5–15.5)
WBC Count: 3.8 10*3/uL — ABNORMAL LOW (ref 4.0–10.5)
nRBC: 1.3 % — ABNORMAL HIGH (ref 0.0–0.2)

## 2023-06-29 LAB — IRON AND IRON BINDING CAPACITY (CC-WL,HP ONLY)
Iron: 55 ug/dL (ref 28–170)
Saturation Ratios: 16 % (ref 10.4–31.8)
TIBC: 344 ug/dL (ref 250–450)
UIBC: 289 ug/dL (ref 148–442)

## 2023-06-29 LAB — VITAMIN B12: Vitamin B-12: 621 pg/mL (ref 180–914)

## 2023-06-29 LAB — FERRITIN: Ferritin: 117 ng/mL (ref 11–307)

## 2023-06-29 LAB — FOLATE: Folate: 8.2 ng/mL (ref >5.9)

## 2023-06-29 NOTE — Telephone Encounter (Signed)
 Cld Pt. No answer, LVM to make Pt aware we have reached out to [employer] and they will f/u with us  on the status of the rollator.

## 2023-06-29 NOTE — Telephone Encounter (Signed)
 CRITICAL VALUE STICKER  CRITICAL VALUE: creatinine 7.45  RECEIVER (on-site recipient of call): Arbie Knock, LPN  DATE & TIME NOTIFIED:10:19  06/29/2023   MESSENGER (representative from lab): Camilo Cella  MD NOTIFIED: Wyline Hearing, PA  TIME OF NOTIFICATION: 10:20  RESPONSE:

## 2023-06-29 NOTE — Telephone Encounter (Signed)
 Pt reports that she is getting nowhere with the DME, pt is asking if the order can be sent to another company, please call pt to discuss.

## 2023-06-29 NOTE — Telephone Encounter (Signed)
 Cld [employer] spoke w/[place] who directed to [facility] DME who will provide the rollator. Mary at [facility] stated they had not received the order. Spoke w/Mitchell, [employer] Mgr, made him aware the order was sent to [employer] 6/2 and we are being told the order was not received. Brenita Callow stated he will f/u that it usually only takes a day or so to approve walkers.

## 2023-06-30 ENCOUNTER — Other Ambulatory Visit: Payer: Self-pay | Admitting: *Deleted

## 2023-06-30 DIAGNOSIS — N2581 Secondary hyperparathyroidism of renal origin: Secondary | ICD-10-CM | POA: Diagnosis not present

## 2023-06-30 DIAGNOSIS — R519 Headache, unspecified: Secondary | ICD-10-CM | POA: Diagnosis not present

## 2023-06-30 DIAGNOSIS — N186 End stage renal disease: Secondary | ICD-10-CM | POA: Diagnosis not present

## 2023-06-30 DIAGNOSIS — D631 Anemia in chronic kidney disease: Secondary | ICD-10-CM | POA: Diagnosis not present

## 2023-06-30 DIAGNOSIS — D649 Anemia, unspecified: Secondary | ICD-10-CM

## 2023-06-30 DIAGNOSIS — D509 Iron deficiency anemia, unspecified: Secondary | ICD-10-CM | POA: Diagnosis not present

## 2023-06-30 DIAGNOSIS — Z992 Dependence on renal dialysis: Secondary | ICD-10-CM | POA: Diagnosis not present

## 2023-06-30 LAB — LIPID PANEL
Cholesterol: 156 mg/dL (ref 0–200)
HDL: 81 mg/dL (ref >40)
LDL Cholesterol: 57 mg/dL (ref 0–99)
Total CHOL/HDL Ratio: 1.9 ratio
Triglycerides: 91 mg/dL (ref <150)
VLDL: 18 mg/dL (ref 0–40)

## 2023-06-30 NOTE — Telephone Encounter (Signed)
 Pt called back informed Pt that  nurse was able to get in contact with [employer]  and will follow up about  rollator Status

## 2023-07-02 DIAGNOSIS — D509 Iron deficiency anemia, unspecified: Secondary | ICD-10-CM | POA: Diagnosis not present

## 2023-07-02 DIAGNOSIS — Z992 Dependence on renal dialysis: Secondary | ICD-10-CM | POA: Diagnosis not present

## 2023-07-02 DIAGNOSIS — N186 End stage renal disease: Secondary | ICD-10-CM | POA: Diagnosis not present

## 2023-07-02 DIAGNOSIS — N2581 Secondary hyperparathyroidism of renal origin: Secondary | ICD-10-CM | POA: Diagnosis not present

## 2023-07-02 DIAGNOSIS — D631 Anemia in chronic kidney disease: Secondary | ICD-10-CM | POA: Diagnosis not present

## 2023-07-02 DIAGNOSIS — R519 Headache, unspecified: Secondary | ICD-10-CM | POA: Diagnosis not present

## 2023-07-04 ENCOUNTER — Ambulatory Visit: Payer: Self-pay

## 2023-07-05 DIAGNOSIS — C44222 Squamous cell carcinoma of skin of right ear and external auricular canal: Secondary | ICD-10-CM | POA: Diagnosis not present

## 2023-07-05 DIAGNOSIS — K5909 Other constipation: Secondary | ICD-10-CM | POA: Diagnosis not present

## 2023-07-05 DIAGNOSIS — M3500 Sicca syndrome, unspecified: Secondary | ICD-10-CM | POA: Diagnosis not present

## 2023-07-05 DIAGNOSIS — D631 Anemia in chronic kidney disease: Secondary | ICD-10-CM | POA: Diagnosis not present

## 2023-07-05 DIAGNOSIS — Z992 Dependence on renal dialysis: Secondary | ICD-10-CM | POA: Diagnosis not present

## 2023-07-05 DIAGNOSIS — D509 Iron deficiency anemia, unspecified: Secondary | ICD-10-CM | POA: Diagnosis not present

## 2023-07-05 DIAGNOSIS — M0579 Rheumatoid arthritis with rheumatoid factor of multiple sites without organ or systems involvement: Secondary | ICD-10-CM | POA: Diagnosis not present

## 2023-07-05 DIAGNOSIS — R0982 Postnasal drip: Secondary | ICD-10-CM | POA: Diagnosis not present

## 2023-07-05 DIAGNOSIS — M349 Systemic sclerosis, unspecified: Secondary | ICD-10-CM | POA: Diagnosis not present

## 2023-07-05 DIAGNOSIS — M359 Systemic involvement of connective tissue, unspecified: Secondary | ICD-10-CM | POA: Diagnosis not present

## 2023-07-05 DIAGNOSIS — N2581 Secondary hyperparathyroidism of renal origin: Secondary | ICD-10-CM | POA: Diagnosis not present

## 2023-07-05 DIAGNOSIS — I2721 Secondary pulmonary arterial hypertension: Secondary | ICD-10-CM | POA: Diagnosis not present

## 2023-07-05 DIAGNOSIS — R519 Headache, unspecified: Secondary | ICD-10-CM | POA: Diagnosis not present

## 2023-07-05 DIAGNOSIS — N186 End stage renal disease: Secondary | ICD-10-CM | POA: Diagnosis not present

## 2023-07-06 ENCOUNTER — Encounter: Payer: Self-pay | Admitting: Physical Therapy

## 2023-07-06 ENCOUNTER — Other Ambulatory Visit: Payer: Self-pay

## 2023-07-06 ENCOUNTER — Ambulatory Visit: Attending: Obstetrics and Gynecology | Admitting: Physical Therapy

## 2023-07-06 DIAGNOSIS — R293 Abnormal posture: Secondary | ICD-10-CM | POA: Diagnosis not present

## 2023-07-06 DIAGNOSIS — M6281 Muscle weakness (generalized): Secondary | ICD-10-CM | POA: Insufficient documentation

## 2023-07-06 DIAGNOSIS — R279 Unspecified lack of coordination: Secondary | ICD-10-CM | POA: Diagnosis not present

## 2023-07-06 NOTE — Therapy (Addendum)
 OUTPATIENT PHYSICAL THERAPY FEMALE PELVIC EVALUATION   Patient Name: Katie Nunez MRN: [id number] DOB:[birth year], 68 y.o., female Today's Date: 07/06/2023  END OF SESSION:  PT End of Session - 07/06/23 1238     Visit Number 1    Number of Visits 10    Date for PT Re-Evaluation 08/03/23    Authorization Type [employer] MCR    PT Start Time 1024    PT Stop Time 1100    PT Time Calculation (min) 36 min    Activity Tolerance Patient tolerated treatment well    Behavior During Therapy WFL for tasks assessed/performed          Past Medical History:  Diagnosis Date   Achalasia    Anxiety    Dysplasia of cervix, low grade (CIN 1)    Environmental allergies    all year long (12/27/2016)   ESRD (end stage renal disease) on dialysis (HCC)    TTS; [place] (12/27/2016)   Fibromyalgia    GERD (gastroesophageal reflux disease)    Gout    Hypertension    IBS (irritable bowel syndrome)    MVP (mitral valve prolapse)    RA (rheumatoid arthritis) (HCC)    FOLLOWED BY DR. SHANAHAN   Raynaud's disease    Scleroderma (HCC)    Seasonal allergies    Thrombocytopenia (HCC) 07/01/2016   Acute fall to 13,000 07/01/16   Tubular adenoma 01/08/2008   CECUM   Vitamin D  deficiency    Past Surgical History:  Procedure Laterality Date   A/V FISTULAGRAM N/A 02/02/2023   Procedure: A/V Fistulagram;  Surgeon: Norine Manuelita LABOR, MD;  Location: [facility];  Service: Cardiovascular;  Laterality: N/A;   ANKLE FRACTURE SURGERY Right    AV FISTULA PLACEMENT Left 06/28/2016   Procedure: left arm ARTERIOVENOUS (AV) FISTULA CREATION;  Surgeon: Oris Krystal FALCON, MD;  Location: [facility] OR;  Service: Vascular;  Laterality: Left;   BASCILIC VEIN TRANSPOSITION Left 09/27/2016   Procedure: LEFT UPPER ARM CEPHALIC VEIN TRANSPOSITION;  Surgeon: Oris Krystal FALCON, MD;  Location: [facility] OR;  Service: Vascular;  Laterality: Left;   BREAST BIOPSY     ? side   CESAREAN SECTION  1994   CO2 LASER OF CERVIX      COLONOSCOPY W/ BIOPSIES  01/08/2008   INSERTION OF DIALYSIS CATHETER Right 06/28/2016   Procedure: INSERTION OF DIALYSIS CATHETER, right internal jugular;  Surgeon: Oris Krystal FALCON, MD;  Location: [facility] OR;  Service: Vascular;  Laterality: Right;   MYOMECTOMY     NASAL ENDOSCOPY WITH EPISTAXIS CONTROL N/A 12/29/2019   Procedure: NASAL ENDOSCOPY WITH EPISTAXIS CONTROL;  Surgeon: Karis Clunes, MD;  Location: [facility] OR;  Service: ENT;  Laterality: N/A;   PELVIC LAPAROSCOPY  2011   superficial thrombophlebitis Left 07-2014   Patient Active Problem List   Diagnosis Date Noted   Chronic respiratory failure with hypoxia (HCC) 06/01/2023   Impingement syndrome of right shoulder 01/11/2023   Pain due to onychomycosis of toenails of both feet 10/13/2022   Colitis 06/25/2022   Elevated troponin 06/25/2022   Prolonged QT interval 06/25/2022   Chronic diastolic CHF (congestive heart failure) (HCC) 06/25/2022   GERD (gastroesophageal reflux disease) 06/25/2022   GAD (generalized anxiety disorder) 06/25/2022   Hyperlipidemia 06/25/2022   Atypical chest pain 06/24/2022   Allergic rhinitis 04/07/2022   Ankle swelling 11/26/2021   Neck pain 10/30/2020   Knee locking, left 10/05/2020   Abnormality of gait 02/25/2020   Protein-calorie malnutrition, severe 01/19/2020  Epistaxis    Sleep disturbance    Slow transit constipation    Hemorrhoids    Chronic systolic congestive heart failure (HCC)    End-stage renal disease on hemodialysis (HCC)    Right hemiparesis (HCC)    Pressure injury of skin 12/20/2019   Intraparenchymal hemorrhage of brain (HCC) 12/18/2019   ICH (intracerebral hemorrhage) (HCC) 12/12/2019   Viral disease 08/22/2019   Chronic right-sided heart failure (HCC) 07/09/2019   Supraventricular tachycardia (HCC) 07/09/2019   Other cirrhosis of liver (HCC) 06/27/2019   Heart failure (HCC) 02/21/2019   Heart palpitations 08/14/2018   Other fluid overload 07/27/2018   Encounter for long-term  (current) use of other medications 01/18/2018   Hypothyroidism 01/18/2018   Myalgia and myositis 01/18/2018   Chronic nephritis 01/18/2018   Other long term (current) drug therapy 01/18/2018   Sleep apnea 01/18/2018   Unspecified persistent mental disorders due to conditions classified elsewhere 01/18/2018   Venous reflux 01/18/2018   Vitamin B12 deficiency 01/18/2018   Vitamin D  deficiency 01/18/2018   Obstructive lung disease (HCC) 12/30/2017   Other pruritus 12/27/2017   Eruption cyst 12/19/2017   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 (HCC) 11/28/2017   Chronic cough 11/11/2017   Pulmonary hypertension (HCC) 11/11/2017   Pulmonary arterial hypertension (HCC) 10/07/2017   Acute ITP (HCC) 01/05/2017   ESRD (end stage renal disease) (HCC) 12/28/2016   Encounter for removal of sutures 09/09/2016   Coagulation defect, unspecified (HCC) 09/01/2016   Underimmunization status 08/05/2016   Hemolytic anemia (HCC) 07/26/2016   Epistaxis, recurrent 07/26/2016   Unspecified protein-calorie malnutrition (HCC) 07/13/2016   Aftercare including intermittent dialysis (HCC) 07/07/2016   Anemia in chronic kidney disease 07/07/2016   Hypokalemia 07/07/2016   Iron  deficiency anemia, unspecified 07/07/2016   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   Nonrheumatic mitral (valve) prolapse 07/07/2016   Irritable bowel syndrome 07/07/2016   Other secondary thrombocytopenia 07/07/2016   Secondary hyperparathyroidism of renal origin (HCC) 07/07/2016   Thrombocytopenia (HCC) 07/01/2016   ARF (acute renal failure) (HCC) 06/25/2016   Anxiety 06/25/2016   Bilateral carpal tunnel syndrome 10/22/2015   Chronic gout without tophus 10/22/2015   Chronic nonintractable headache 10/08/2015   Fibroid uterus 01/03/2012   H/O vitamin D  deficiency 01/03/2012   Post-menopausal 01/03/2012   Hereditary and idiopathic peripheral neuropathy 11/19/2011   Intestinal malabsorption 11/19/2011    Lichen planus 11/19/2011   Low back pain 11/19/2011   Diffuse spasm of esophagus 11/11/2011   ESR raised 11/11/2011   Postinflammatory pulmonary fibrosis (HCC) 11/11/2011   Scleroderma (HCC) 11/16/2010   Rheumatoid arthritis (HCC) 11/16/2010   Raynaud's disease 11/16/2010   Symptomatic menopausal or female climacteric states 11/16/2010    PCP: Claudene Pellet, MD  REFERRING PROVIDER: Alvia Dorothyann LABOR, MD   REFERRING DIAG: K56.41 (ICD-10-CM) - Fecal impaction  THERAPY DIAG:  Muscle weakness (generalized)  Unspecified lack of coordination  Abnormal posture  Rationale for Evaluation and Treatment: Rehabilitation  ONSET DATE: unknown (chronic)  SUBJECTIVE:  SUBJECTIVE STATEMENT: Patient reports to PFPT with fecal impaction. She has dealt with constipation for quite some time, and recently she had a pelvic examination that revealed dysfunction in the musculature with evacuation of stool. She is technically constipated and also cannot empty stool well due to feeling like there is a pocket where the stool is collecting. She feels like she is impacted. She also deals with hemorrhoids from straining to pass bowel movements. Her sleep is disrupted due to needing to have a bowel movement constantly. She has abdominal bloating when she is constipated.  Fluid intake: couple of glasses of water per day, 1 cup of coffee per day with her miralax , enjoys herbal teas (1 per day), a couple of sodas per day. Fruit juices occasionally   PAIN:  Are you having pain? No NPRS scale: 0/10  PRECAUTIONS: None  RED FLAGS: Bowel or bladder incontinence: Yes: has fecal leakage when she takes stool softeners - thinks that the fecal incontinence is from the softeners but still feels impacted   WEIGHT BEARING  RESTRICTIONS: No  FALLS:  Has patient fallen in last 6 months? No  OCCUPATION: retired  ACTIVITY LEVEL : no   PLOF: Independent with household mobility with device  PATIENT GOALS: to have normal bowel movements and less bloating/constipation  PERTINENT HISTORY:  Hx: c-section 1994, anxiety, GERD, hypertension, IBS, RA  BOWEL MOVEMENT: Pain with bowel movement: Yes Type of bowel movement:Type (Bristol Stool Scale) 1-7, Frequency daily, multiple times depending on what she has taken, Strain yes if constipated, and Splinting no Fully empty rectum: No Leakage: Yes: she will have some leakage with looser stool occasionally, but this is usually not an issues  Pads: No Fiber supplement/laxative Yes - lactolose and miralax    URINATION: Pain with urination: No Fully empty bladder: Yes:   Stream: Strong Urgency: Yes  Frequency: within normal limits  Leakage: none Pads: No  INTERCOURSE:  Ability to have vaginal penetration Yes  Pain with intercourse: none DrynessYes   PREGNANCY: C-section deliveries 1 Currently pregnant No  PROLAPSE: None  OBJECTIVE:  Note: Objective measures were completed at Evaluation unless otherwise noted.  PATIENT SURVEYS:  PFIQ-7: 72  COGNITION: Overall cognitive status: Within functional limits for tasks assessed     SENSATION: Light touch: Appears intact  LUMBAR SPECIAL TESTS:  Single leg stance test: Positive  FUNCTIONAL TESTS:  Squat test: weight shift to the left, dynamic knee valgus with loading, general lumbopelvic stiffness present with transferring   GAIT: Assistive device utilized: Environmental consultant - 4 wheeled Comments: moderate trendelenburg gait pattern on right LE with ambulation   POSTURE: rounded shoulders, forward head, increased thoracic kyphosis, and flexed trunk   LUMBARAROM/PROM:  A/PROM A/PROM  eval  Flexion 50% limited  Extension 50% limited  Right lateral flexion 25% limited  Left lateral flexion 25% limited  Right  rotation 25% limited  Left rotation 25% limited   (Blank rows = not tested)  LOWER EXTREMITY ROM: within functional limits   LOWER EXTREMITY MMT:  MMT Right eval Left eval  Hip flexion 3+ 4  Hip extension 3+ 4  Hip abduction 3+ 3+  Hip adduction 4 4  Hip internal rotation    Hip external rotation    Knee flexion 4 4  Knee extension 4 4  Ankle dorsiflexion    Ankle plantarflexion    Ankle inversion    Ankle eversion     (Blank rows = not tested) PALPATION:   General: no significant tenderness to palpation of bilateral  adductors, hip flexors, or lumbopelvic musculature. Some tenderness to palpation of bilateral ASIS landmarks   Pelvic Alignment: within normal limits   Abdominal: upper chest breathing and abdominal bracing present in seated position. With supine diaphragmatic breathing, patient demonstrates decreased lower rib excursion with inhalation                 External Perineal Exam: rectal examination to be performed at first follow up visit due to time limitations.                             Internal Pelvic Floor: rectal examination to be performed at first follow up visit due to time limitations.  Patient confirms identification and approves PT to assess internal pelvic floor and treatment Yes No emotional/communication barriers or cognitive limitation. Patient is motivated to learn. Patient understands and agrees with treatment goals and plan. PT explains patient will be examined in standing, sitting, and lying down to see how their muscles and joints work. When they are ready, they will be asked to remove their underwear so PT can examine their perineum. The patient is also given the option of providing their own chaperone as one is not provided in our facility. The patient also has the right and is explained the right to defer or refuse any part of the evaluation or treatment including the internal exam. With the patient's consent, PT will use one gloved finger to  gently assess the muscles of the pelvic floor, seeing how well it contracts and relaxes and if there is muscle symmetry. After, the patient will get dressed and PT and patient will discuss exam findings and plan of care. PT and patient discuss plan of care, schedule, attendance policy and HEP activities.  PELVIC MMT: rectal examination to be performed at first follow up visit due to time limitations.   MMT eval  Vaginal   Internal Anal Sphincter   External Anal Sphincter   Puborectalis   Diastasis Recti   (Blank rows = not tested)       TONE: rectal examination to be performed at first follow up visit due to time limitations.  PROLAPSE: rectal examination to be performed at first follow up visit due to time limitations.  TODAY'S TREATMENT:                                                                                                                              DATE:   EVAL 07/06/23: Examination completed, findings reviewed, pt educated on POC, HEP, and self care. Pt motivated to participate in PT and agreeable to attempt recommendations.   Neuro re-ed/Self care:  How to pass bowel movements:  Sit all the way down, feet flat on the floor or on your toilet stool  Take 3 deep breaths when you sit down to relax your anus  Imagine you are fogging up a mirror with your breath as you  EXHALE and GENTLY PUSH DOWN to evacuate the stool  Do a potty dance side/side and forward/backward to see if there is any more stool to push out, and if there is, do your breathing technique again to pass the remaining stool  About Abdominal Massage Positioning You can practice abdominal massage with oil while lying down, or in the shower with soap.  Some people find that it is just as effective to do the massage through clothing while sitting or standing. How to Massage Start by placing your finger tips or knuckles on your right side, just inside your hip bone.  Make small circular movements while you move  upward toward your rib cage.   Once you reach the bottom right side of your rib cage, take your circular movements across to the left side of the bottom of your rib cage.  Next, move downward until you reach the inside of your left hip bone.  This is the path your feces travel in your colon. Continue to perform your abdominal massage in this pattern for 10 minutes each day.    PATIENT EDUCATION:  Education details: relative anatomy and the connection between the diaphragm and pelvic floor, constipation management and education, toileting techniques for stool evacuation, abdominal massage to encourage transit of stool and to decrease abdominal bloating/discomfort at rest Person educated: Patient Education method: Explanation, Demonstration, Tactile cues, Verbal cues, and Handouts Education comprehension: verbalized understanding, returned demonstration, verbal cues required, tactile cues required, and needs further education  HOME EXERCISE PROGRAM: To be established at follow up.  ASSESSMENT:  CLINICAL IMPRESSION: Patient is a 68 y.o. female  who was seen today for physical therapy evaluation and treatment for fecal impaction/constipation. Patient has dealt with constipation for many years, and is currently on dialysis and is carrying the following diagnoses: anxiety, GERD, hypertension, IBS, RA. She feels constipated at all times, but when she tries to pass bowel movements, it feels like the stool gets stuck in the rectum. We introduced education surrounding fiber intake, water intake, toileting mechanics for complete bowel emptying, use of squatty potty for stool evacuation, and how to breathe when passing bowel movements. Abdominal assessment performed and patient demonstrates fecal bulkage in the right lower quadrant and across the transverse colon. Abdominal massage performed and patient encouraged/educated to practice this nightly to encourage stool transit. Patient arrived to PT evaluation  late today, so rectal examination was postponed until first follow up visit due to time limitations. No questions following today's session. Overall, patient tolerated session very well and Pt would benefit from additional PT to further address deficits.    OBJECTIVE IMPAIRMENTS: decreased coordination, decreased endurance, decreased knowledge of condition, decreased mobility, difficulty walking, decreased strength, and postural dysfunction.   ACTIVITY LIMITATIONS: continence and toileting  PARTICIPATION LIMITATIONS: community activity  PERSONAL FACTORS: Age, Past/current experiences, Time since onset of injury/illness/exacerbation, and 3+ comorbidities: anxiety, GERD, hypertension, IBS, RA are also affecting patient's functional outcome.   REHAB POTENTIAL: Good  CLINICAL DECISION MAKING: Stable/uncomplicated  EVALUATION COMPLEXITY: Low   GOALS: Goals reviewed with patient? Yes  SHORT TERM GOALS: Target date: 08/03/2023  Pt will be independent with HEP.  Baseline: Goal status: INITIAL  2.  Pt will be independent with use of squatty potty, relaxed toileting mechanics, and improved bowel movement techniques in order to increase ease of bowel movements and complete evacuation.   Baseline:  Goal status: INITIAL  3.  Pt will report 50% less abdominal bloating and discomfort due to improved muscle tone throughout  the core and improved overall large intestine motility due to implementation of bowel massage daily. Baseline:  Goal status: INITIAL  LONG TERM GOALS: Target date: 01/05/2024  Pt will be independent with advanced HEP.  Baseline:  Goal status: INITIAL  2.  Pt will report her BMs are complete due to improved bowel habits and evacuation techniques to improve quality of life and to prevent sleep disruption due to discomfort from constipation. Baseline:  Goal status: INITIAL  3.  Pt will report 90% less abdominal bloating and discomfort due to improved muscle tone throughout  the core and improved overall large intestine motility due to implementation of bowel massage daily. Baseline:  Goal status: INITIAL  4.  Pt will be independent with diaphragmatic breathing and down training activities in order to improve pelvic floor relaxation.  Baseline:  Goal status: INITIAL  5.  Pt to demonstrate at least 4/5 bil hip strength for improved pelvic stability and functional squats without discomfort to improve quality of life.  Baseline:  Goal status: INITIAL  PLAN:  PT FREQUENCY: 1-2x/week  PT DURATION: 6 months  PLANNED INTERVENTIONS: 97110-Therapeutic exercises, 97530- Therapeutic activity, 97112- Neuromuscular re-education, 97535- Self Care, [id number]- Manual therapy, Patient/Family education, Taping, Joint mobilization, Spinal mobilization, Scar mobilization, Cryotherapy, and Moist heat  PLAN FOR NEXT SESSION: rectal examination, introduction to pelvic floor training with breathing techniques, introduce downtraining stretches to help with overall mobility and general lumbopelvic/abdominal tension, bowel massage for increased colonic transit  Celena JAYSON Domino, PT 07/06/2023, 12:56 PM  PHYSICAL THERAPY DISCHARGE SUMMARY  Visits from Start of Care: 1  Current functional level related to goals / functional outcomes: See above. Patient has no showed 2 times for physical therapy. According to our attendance policy she is to be discharged and will need a new script to return to physical therapy.    Remaining deficits: See above.    Education / Equipment: HEP   Patient agrees to discharge. Patient goals were not met. Patient is being discharged due to not showing for physical therapy for 2 appointments. Thank you for the referral.   Channing Pereyra, PT 09/09/23 9:13 AM

## 2023-07-06 NOTE — Patient Instructions (Signed)
 How to pass bowel movements:  Sit all the way down, feet flat on the floor or on your toilet stool  Take 3 deep breaths when you sit down to relax your anus  Imagine you are fogging up a mirror with your breath as you EXHALE and GENTLY PUSH DOWN to evacuate the stool  Do a potty dance side/side and forward/backward to see if there is any more stool to push out, and if there is, do your breathing technique again to pass the remaining stool   .About Abdominal Massage  Abdominal massage, also called external colon massage, is a self-treatment circular massage technique that can reduce and eliminate gas and ease constipation. The colon naturally contracts in waves in a clockwise direction starting from inside the right hip, moving up toward the ribs, across the belly, and down inside the left hip.  When you perform circular abdominal massage, you help stimulate your colon's normal wave pattern of movement called peristalsis.  It is most beneficial when done after eating.  Positioning You can practice abdominal massage with oil while lying down, or in the shower with soap.  Some people find that it is just as effective to do the massage through clothing while sitting or standing.  How to Massage Start by placing your finger tips or knuckles on your right side, just inside your hip bone.  Make small circular movements while you move upward toward your rib cage.   Once you reach the bottom right side of your rib cage, take your circular movements across to the left side of the bottom of your rib cage.  Next, move downward until you reach the inside of your left hip bone.  This is the path your feces travel in your colon. Continue to perform your abdominal massage in this pattern for 10 minutes each day.     You can apply as much pressure as is comfortable in your massage.  Start gently and build pressure as you continue to practice.  Notice any areas of pain as you massage; areas of slight pain may be  relieved as you massage, but if you have areas of significant or intense pain, consult with your healthcare provider.  Other Considerations General physical activity including bending and stretching can have a beneficial massage-like effect on the colon.  Deep breathing can also stimulate the colon because breathing deeply activates the same nervous system that supplies the colon.   Abdominal massage should always be used in combination with a bowel-conscious diet that is high in the proper type of fiber for you, fluids (primarily water), and a regular exercise program.

## 2023-07-07 DIAGNOSIS — D509 Iron deficiency anemia, unspecified: Secondary | ICD-10-CM | POA: Diagnosis not present

## 2023-07-07 DIAGNOSIS — N186 End stage renal disease: Secondary | ICD-10-CM | POA: Diagnosis not present

## 2023-07-07 DIAGNOSIS — N2581 Secondary hyperparathyroidism of renal origin: Secondary | ICD-10-CM | POA: Diagnosis not present

## 2023-07-07 DIAGNOSIS — M349 Systemic sclerosis, unspecified: Secondary | ICD-10-CM | POA: Diagnosis not present

## 2023-07-07 DIAGNOSIS — D631 Anemia in chronic kidney disease: Secondary | ICD-10-CM | POA: Diagnosis not present

## 2023-07-07 DIAGNOSIS — Z992 Dependence on renal dialysis: Secondary | ICD-10-CM | POA: Diagnosis not present

## 2023-07-07 DIAGNOSIS — R519 Headache, unspecified: Secondary | ICD-10-CM | POA: Diagnosis not present

## 2023-07-07 DIAGNOSIS — K641 Second degree hemorrhoids: Secondary | ICD-10-CM | POA: Diagnosis not present

## 2023-07-09 DIAGNOSIS — Z992 Dependence on renal dialysis: Secondary | ICD-10-CM | POA: Diagnosis not present

## 2023-07-09 DIAGNOSIS — N186 End stage renal disease: Secondary | ICD-10-CM | POA: Diagnosis not present

## 2023-07-09 DIAGNOSIS — D631 Anemia in chronic kidney disease: Secondary | ICD-10-CM | POA: Diagnosis not present

## 2023-07-09 DIAGNOSIS — N2581 Secondary hyperparathyroidism of renal origin: Secondary | ICD-10-CM | POA: Diagnosis not present

## 2023-07-09 DIAGNOSIS — D509 Iron deficiency anemia, unspecified: Secondary | ICD-10-CM | POA: Diagnosis not present

## 2023-07-09 DIAGNOSIS — R519 Headache, unspecified: Secondary | ICD-10-CM | POA: Diagnosis not present

## 2023-07-11 DIAGNOSIS — N186 End stage renal disease: Secondary | ICD-10-CM | POA: Diagnosis not present

## 2023-07-11 DIAGNOSIS — I10 Essential (primary) hypertension: Secondary | ICD-10-CM | POA: Diagnosis not present

## 2023-07-11 DIAGNOSIS — E782 Mixed hyperlipidemia: Secondary | ICD-10-CM | POA: Diagnosis not present

## 2023-07-11 DIAGNOSIS — N269 Renal sclerosis, unspecified: Secondary | ICD-10-CM | POA: Diagnosis not present

## 2023-07-11 DIAGNOSIS — Z992 Dependence on renal dialysis: Secondary | ICD-10-CM | POA: Diagnosis not present

## 2023-07-11 DIAGNOSIS — I50812 Chronic right heart failure: Secondary | ICD-10-CM | POA: Diagnosis not present

## 2023-07-12 DIAGNOSIS — Z992 Dependence on renal dialysis: Secondary | ICD-10-CM | POA: Diagnosis not present

## 2023-07-12 DIAGNOSIS — N2581 Secondary hyperparathyroidism of renal origin: Secondary | ICD-10-CM | POA: Diagnosis not present

## 2023-07-12 DIAGNOSIS — D509 Iron deficiency anemia, unspecified: Secondary | ICD-10-CM | POA: Diagnosis not present

## 2023-07-12 DIAGNOSIS — N186 End stage renal disease: Secondary | ICD-10-CM | POA: Diagnosis not present

## 2023-07-12 DIAGNOSIS — D631 Anemia in chronic kidney disease: Secondary | ICD-10-CM | POA: Diagnosis not present

## 2023-07-14 DIAGNOSIS — Z992 Dependence on renal dialysis: Secondary | ICD-10-CM | POA: Diagnosis not present

## 2023-07-14 DIAGNOSIS — D509 Iron deficiency anemia, unspecified: Secondary | ICD-10-CM | POA: Diagnosis not present

## 2023-07-14 DIAGNOSIS — N2581 Secondary hyperparathyroidism of renal origin: Secondary | ICD-10-CM | POA: Diagnosis not present

## 2023-07-14 DIAGNOSIS — N186 End stage renal disease: Secondary | ICD-10-CM | POA: Diagnosis not present

## 2023-07-14 DIAGNOSIS — D631 Anemia in chronic kidney disease: Secondary | ICD-10-CM | POA: Diagnosis not present

## 2023-07-16 DIAGNOSIS — N186 End stage renal disease: Secondary | ICD-10-CM | POA: Diagnosis not present

## 2023-07-16 DIAGNOSIS — N2581 Secondary hyperparathyroidism of renal origin: Secondary | ICD-10-CM | POA: Diagnosis not present

## 2023-07-16 DIAGNOSIS — D631 Anemia in chronic kidney disease: Secondary | ICD-10-CM | POA: Diagnosis not present

## 2023-07-16 DIAGNOSIS — Z992 Dependence on renal dialysis: Secondary | ICD-10-CM | POA: Diagnosis not present

## 2023-07-16 DIAGNOSIS — D509 Iron deficiency anemia, unspecified: Secondary | ICD-10-CM | POA: Diagnosis not present

## 2023-07-18 ENCOUNTER — Other Ambulatory Visit (INDEPENDENT_AMBULATORY_CARE_PROVIDER_SITE_OTHER)

## 2023-07-18 ENCOUNTER — Encounter: Payer: Self-pay | Admitting: Orthopedic Surgery

## 2023-07-18 ENCOUNTER — Ambulatory Visit (INDEPENDENT_AMBULATORY_CARE_PROVIDER_SITE_OTHER): Admitting: Orthopedic Surgery

## 2023-07-18 DIAGNOSIS — M25571 Pain in right ankle and joints of right foot: Secondary | ICD-10-CM

## 2023-07-18 MED ORDER — LIDOCAINE HCL 1 % IJ SOLN
2.0000 mL | INTRAMUSCULAR | Status: AC | PRN
  Administered 2023-07-18: 2 mL

## 2023-07-18 MED ORDER — METHYLPREDNISOLONE ACETATE 40 MG/ML IJ SUSP
40.0000 mg | INTRAMUSCULAR | Status: AC | PRN
  Administered 2023-07-18: 40 mg via INTRA_ARTICULAR

## 2023-07-18 NOTE — Progress Notes (Signed)
 Office Visit Note   Patient: Katie Nunez           Date of Birth: [birth year]           MRN: [id number] Visit Date: 07/18/2023              Requested by: Claudene Pellet, MD [phone] [address] [place],  [place] [address] PCP: Claudene Pellet, MD  Chief Complaint  Patient presents with   Right Ankle - Pain      HPI: Patient is a 68 year old woman who was seen for anterior right ankle pain and swelling.  She is status post open reduction internal fixation for bimalleolar fracture 20 years ago.  Patient states she has had pain and swelling for the past several weeks anteriorly to the ankle.  Patient currently ambulates in a wheelchair.  Patient states she also has neuropathy in both feet for which she has occasionally taken Neurontin .  Patient is currently managed at [facility] with rheumatoid arthritis and Sjogren's syndrome.  She is on dialysis Tuesday Thursday Saturday.  Assessment & Plan: Visit Diagnoses:  1. Pain in right ankle and joints of right foot     Plan: Right ankle was injected she tolerated this well follow-up as needed.  Follow-Up Instructions: No follow-ups on file.   Ortho Exam  Patient is alert, oriented, no adenopathy, well-dressed, normal affect, normal respiratory effort. Examination patient has swelling anteriorly over the right ankle.  There is no redness or cellulitis.  The medial and lateral malleolus are not tender to palpation there, there is tenderness to palpation directly over the anterior ankle.  Negative sciatic tension signs no radicular symptoms.    Imaging: XR Ankle Complete Right Result Date: 07/18/2023 Three-view radiographs of the right ankle shows a congruent mortise with out any lucency around the hardware no hardware failure.  There is calcifications within the soft tissue.   No images are attached to the encounter.  Labs: Lab Results  Component Value Date   HGBA1C 5.4 04/28/2022   HGBA1C 5.2 12/12/2019   ESRSEDRATE 40 (H)  06/25/2022   ESRSEDRATE 7 04/28/2022   ESRSEDRATE 22 11/08/2017   CRP 0.5 06/25/2022   CRP 3.0 10/10/2015   LABURIC 3.5 11/08/2017   LABURIC 10.3 (H) 10/10/2015     Lab Results  Component Value Date   ALBUMIN 3.6 06/29/2023   ALBUMIN 3.8 03/23/2023   ALBUMIN 3.7 12/29/2022    Lab Results  Component Value Date   MG 2.1 06/25/2022   Lab Results  Component Value Date   VD25OH 12.2 (L) 04/28/2022    No results found for: PREALBUMIN    Latest Ref Rng & Units 06/29/2023    9:24 AM 03/23/2023   10:53 AM 12/29/2022    9:36 AM  CBC EXTENDED  WBC 4.0 - 10.5 K/uL 3.8  4.0  2.9   RBC 3.87 - 5.11 MIL/uL 3.25  3.69  2.98    3.07   Hemoglobin 12.0 - 15.0 g/dL 9.2  89.3  8.8   HCT 63.9 - 46.0 % 29.4  33.2  28.3   Platelets 150 - 400 K/uL 177  234  145   NEUT# 1.7 - 7.7 K/uL 2.4  2.8  1.7   Lymph# 0.7 - 4.0 K/uL 0.7  0.7  0.7      There is no height or weight on file to calculate BMI.  Orders:  Orders Placed This Encounter  Procedures   XR Ankle Complete Right   No  orders of the defined types were placed in this encounter.    Procedures: Medium Joint Inj: R ankle on 07/18/2023 12:36 PM Indications: pain and diagnostic evaluation Details: 22 G 1.5 in needle, anteromedial approach Medications: 2 mL lidocaine  1 %; 40 mg methylPREDNISolone  acetate 40 MG/ML Outcome: tolerated well, no immediate complications Procedure, treatment alternatives, risks and benefits explained, specific risks discussed. Consent was given by the patient. Immediately prior to procedure a time out was called to verify the correct patient, procedure, equipment, support staff and site/side marked as required. Patient was prepped and draped in the usual sterile fashion.      Clinical Data: No additional findings.  ROS:  All other systems negative, except as noted in the HPI. Review of Systems  Objective: Vital Signs: LMP 11/15/2008   Specialty Comments:  No specialty comments  available.  PMFS History: Patient Active Problem List   Diagnosis Date Noted   Chronic respiratory failure with hypoxia (HCC) 06/01/2023   Impingement syndrome of right shoulder 01/11/2023   Pain due to onychomycosis of toenails of both feet 10/13/2022   Colitis 06/25/2022   Elevated troponin 06/25/2022   Prolonged QT interval 06/25/2022   Chronic diastolic CHF (congestive heart failure) (HCC) 06/25/2022   GERD (gastroesophageal reflux disease) 06/25/2022   GAD (generalized anxiety disorder) 06/25/2022   Hyperlipidemia 06/25/2022   Atypical chest pain 06/24/2022   Allergic rhinitis 04/07/2022   Ankle swelling 11/26/2021   Neck pain 10/30/2020   Knee locking, left 10/05/2020   Abnormality of gait 02/25/2020   Protein-calorie malnutrition, severe 01/19/2020   Epistaxis    Sleep disturbance    Slow transit constipation    Hemorrhoids    Chronic systolic congestive heart failure (HCC)    End-stage renal disease on hemodialysis (HCC)    Right hemiparesis (HCC)    Pressure injury of skin 12/20/2019   Intraparenchymal hemorrhage of brain (HCC) 12/18/2019   ICH (intracerebral hemorrhage) (HCC) 12/12/2019   Viral disease 08/22/2019   Chronic right-sided heart failure (HCC) 07/09/2019   Supraventricular tachycardia (HCC) 07/09/2019   Other cirrhosis of liver (HCC) 06/27/2019   Heart failure (HCC) 02/21/2019   Heart palpitations 08/14/2018   Other fluid overload 07/27/2018   Encounter for long-term (current) use of other medications 01/18/2018   Hypothyroidism 01/18/2018   Myalgia and myositis 01/18/2018   Chronic nephritis 01/18/2018   Other long term (current) drug therapy 01/18/2018   Sleep apnea 01/18/2018   Unspecified persistent mental disorders due to conditions classified elsewhere 01/18/2018   Venous reflux 01/18/2018   Vitamin B12 deficiency 01/18/2018   Vitamin D  deficiency 01/18/2018   Obstructive lung disease (HCC) 12/30/2017   Other pruritus 12/27/2017   Eruption  cyst 12/19/2017   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 (HCC) 11/28/2017   Chronic cough 11/11/2017   Pulmonary hypertension (HCC) 11/11/2017   Pulmonary arterial hypertension (HCC) 10/07/2017   Acute ITP (HCC) 01/05/2017   ESRD (end stage renal disease) (HCC) 12/28/2016   Encounter for removal of sutures 09/09/2016   Coagulation defect, unspecified (HCC) 09/01/2016   Underimmunization status 08/05/2016   Hemolytic anemia (HCC) 07/26/2016   Epistaxis, recurrent 07/26/2016   Unspecified protein-calorie malnutrition (HCC) 07/13/2016   Aftercare including intermittent dialysis (HCC) 07/07/2016   Anemia in chronic kidney disease 07/07/2016   Hypokalemia 07/07/2016   Iron  deficiency anemia, unspecified 07/07/2016   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   Nonrheumatic mitral (valve) prolapse 07/07/2016   Irritable bowel syndrome 07/07/2016   Other secondary thrombocytopenia 07/07/2016  Secondary hyperparathyroidism of renal origin (HCC) 07/07/2016   Thrombocytopenia (HCC) 07/01/2016   ARF (acute renal failure) (HCC) 06/25/2016   Anxiety 06/25/2016   Bilateral carpal tunnel syndrome 10/22/2015   Chronic gout without tophus 10/22/2015   Chronic nonintractable headache 10/08/2015   Fibroid uterus 01/03/2012   H/O vitamin D  deficiency 01/03/2012   Post-menopausal 01/03/2012   Hereditary and idiopathic peripheral neuropathy 11/19/2011   Intestinal malabsorption 11/19/2011   Lichen planus 11/19/2011   Low back pain 11/19/2011   Diffuse spasm of esophagus 11/11/2011   ESR raised 11/11/2011   Postinflammatory pulmonary fibrosis (HCC) 11/11/2011   Scleroderma (HCC) 11/16/2010   Rheumatoid arthritis (HCC) 11/16/2010   Raynaud's disease 11/16/2010   Symptomatic menopausal or female climacteric states 11/16/2010   Past Medical History:  Diagnosis Date   Achalasia    Anxiety    Dysplasia of cervix, low grade (CIN 1)    Environmental allergies    all year  long (12/27/2016)   ESRD (end stage renal disease) on dialysis (HCC)    TTS; [facility] (12/27/2016)   Fibromyalgia    GERD (gastroesophageal reflux disease)    Gout    Hypertension    IBS (irritable bowel syndrome)    MVP (mitral valve prolapse)    RA (rheumatoid arthritis) (HCC)    FOLLOWED BY DR. SHANAHAN   Raynaud's disease    Scleroderma (HCC)    Seasonal allergies    Thrombocytopenia (HCC) 07/01/2016   Acute fall to 13,000 07/01/16   Tubular adenoma 01/08/2008   CECUM   Vitamin D  deficiency     Family History  Problem Relation Age of Onset   Cerebral aneurysm Mother    Hypertension Mother    Diabetes Mother    Heart disease Father    Skin telangiectasia Maternal Aunt        possible telangiectasia, patient recalls reddish birthmarks   Heart disease Maternal Aunt    Diabetes Maternal Grandmother    Heart disease Paternal Grandfather    Cerebral palsy Cousin        1ST COUSIN?   Diabetes Paternal Grandmother    Heart attack Maternal Grandfather    Colon cancer Maternal Uncle        older than 37 yo at diagnosis   Cerebral palsy Other    Stomach cancer Paternal Uncle     Past Surgical History:  Procedure Laterality Date   A/V FISTULAGRAM N/A 02/02/2023   Procedure: A/V Fistulagram;  Surgeon: Norine Manuelita LABOR, MD;  Location: [facility] INVASIVE [facility];  Service: Cardiovascular;  Laterality: N/A;   ANKLE FRACTURE SURGERY Right    AV FISTULA PLACEMENT Left 06/28/2016   Procedure: left arm ARTERIOVENOUS (AV) FISTULA CREATION;  Surgeon: Oris Krystal FALCON, MD;  Location: [facility] OR;  Service: Vascular;  Laterality: Left;   BASCILIC VEIN TRANSPOSITION Left 09/27/2016   Procedure: LEFT UPPER ARM CEPHALIC VEIN TRANSPOSITION;  Surgeon: Oris Krystal FALCON, MD;  Location: [facility] OR;  Service: Vascular;  Laterality: Left;   BREAST BIOPSY     ? side   CESAREAN SECTION  1994   CO2 LASER OF CERVIX     COLONOSCOPY W/ BIOPSIES  01/08/2008   INSERTION OF DIALYSIS CATHETER Right 06/28/2016   Procedure:  INSERTION OF DIALYSIS CATHETER, right internal jugular;  Surgeon: Oris Krystal FALCON, MD;  Location: [facility] OR;  Service: Vascular;  Laterality: Right;   MYOMECTOMY     NASAL ENDOSCOPY WITH EPISTAXIS CONTROL N/A 12/29/2019   Procedure: NASAL ENDOSCOPY WITH EPISTAXIS CONTROL;  Surgeon: Karis Clunes, MD;  Location: [facility] OR;  Service: ENT;  Laterality: N/A;   PELVIC LAPAROSCOPY  2011   superficial thrombophlebitis Left 07-2014   Social History   Occupational History   Not on file  Tobacco Use   Smoking status: Never   Smokeless tobacco: Never  Vaping Use   Vaping status: Never Used  Substance and Sexual Activity   Alcohol use: No   Drug use: No   Sexual activity: Not Currently    Birth control/protection: Post-menopausal

## 2023-07-19 DIAGNOSIS — D631 Anemia in chronic kidney disease: Secondary | ICD-10-CM | POA: Diagnosis not present

## 2023-07-19 DIAGNOSIS — Z992 Dependence on renal dialysis: Secondary | ICD-10-CM | POA: Diagnosis not present

## 2023-07-19 DIAGNOSIS — D509 Iron deficiency anemia, unspecified: Secondary | ICD-10-CM | POA: Diagnosis not present

## 2023-07-19 DIAGNOSIS — N2581 Secondary hyperparathyroidism of renal origin: Secondary | ICD-10-CM | POA: Diagnosis not present

## 2023-07-19 DIAGNOSIS — N186 End stage renal disease: Secondary | ICD-10-CM | POA: Diagnosis not present

## 2023-07-21 DIAGNOSIS — N186 End stage renal disease: Secondary | ICD-10-CM | POA: Diagnosis not present

## 2023-07-21 DIAGNOSIS — D631 Anemia in chronic kidney disease: Secondary | ICD-10-CM | POA: Diagnosis not present

## 2023-07-21 DIAGNOSIS — N2581 Secondary hyperparathyroidism of renal origin: Secondary | ICD-10-CM | POA: Diagnosis not present

## 2023-07-21 DIAGNOSIS — Z992 Dependence on renal dialysis: Secondary | ICD-10-CM | POA: Diagnosis not present

## 2023-07-21 DIAGNOSIS — D509 Iron deficiency anemia, unspecified: Secondary | ICD-10-CM | POA: Diagnosis not present

## 2023-07-22 ENCOUNTER — Ambulatory Visit: Admitting: Podiatry

## 2023-07-23 DIAGNOSIS — Z992 Dependence on renal dialysis: Secondary | ICD-10-CM | POA: Diagnosis not present

## 2023-07-23 DIAGNOSIS — N186 End stage renal disease: Secondary | ICD-10-CM | POA: Diagnosis not present

## 2023-07-23 DIAGNOSIS — D631 Anemia in chronic kidney disease: Secondary | ICD-10-CM | POA: Diagnosis not present

## 2023-07-23 DIAGNOSIS — D509 Iron deficiency anemia, unspecified: Secondary | ICD-10-CM | POA: Diagnosis not present

## 2023-07-23 DIAGNOSIS — N2581 Secondary hyperparathyroidism of renal origin: Secondary | ICD-10-CM | POA: Diagnosis not present

## 2023-07-26 DIAGNOSIS — D509 Iron deficiency anemia, unspecified: Secondary | ICD-10-CM | POA: Diagnosis not present

## 2023-07-26 DIAGNOSIS — N186 End stage renal disease: Secondary | ICD-10-CM | POA: Diagnosis not present

## 2023-07-26 DIAGNOSIS — N2581 Secondary hyperparathyroidism of renal origin: Secondary | ICD-10-CM | POA: Diagnosis not present

## 2023-07-26 DIAGNOSIS — Z992 Dependence on renal dialysis: Secondary | ICD-10-CM | POA: Diagnosis not present

## 2023-07-26 DIAGNOSIS — D631 Anemia in chronic kidney disease: Secondary | ICD-10-CM | POA: Diagnosis not present

## 2023-07-27 ENCOUNTER — Encounter: Payer: Self-pay | Admitting: Podiatry

## 2023-07-27 ENCOUNTER — Ambulatory Visit: Admitting: Podiatry

## 2023-07-27 ENCOUNTER — Ambulatory Visit (INDEPENDENT_AMBULATORY_CARE_PROVIDER_SITE_OTHER): Admitting: Podiatry

## 2023-07-27 DIAGNOSIS — K602 Anal fissure, unspecified: Secondary | ICD-10-CM | POA: Insufficient documentation

## 2023-07-27 DIAGNOSIS — G629 Polyneuropathy, unspecified: Secondary | ICD-10-CM

## 2023-07-27 DIAGNOSIS — R195 Other fecal abnormalities: Secondary | ICD-10-CM | POA: Insufficient documentation

## 2023-07-27 DIAGNOSIS — R141 Gas pain: Secondary | ICD-10-CM | POA: Insufficient documentation

## 2023-07-27 DIAGNOSIS — R1310 Dysphagia, unspecified: Secondary | ICD-10-CM | POA: Insufficient documentation

## 2023-07-27 DIAGNOSIS — M19071 Primary osteoarthritis, right ankle and foot: Secondary | ICD-10-CM

## 2023-07-27 NOTE — Progress Notes (Addendum)
  Subjective:  Patient ID: Katie Nunez, female    DOB: 1955-05-30,   MRN: [id number]  Chief Complaint  Patient presents with   Ankle Pain    Medial ankle right - fracture ankle/leg about 25 years ago, been having a lot of swelling, slight pain, she did have Ortho eval-said hardware looked good on xray, gave cortisone injection-no help, swelling into leg as well, continued issues with neuropathy-tried gabapentin  in the past but doesn't like how it makes her feel   New Patient (Initial Visit)    Est pt 2024    68 y.o. female presents for concern as above. She has been seen at multiple different times. She has had vascular status evaluated and this appears to be well. She was recently seen by vascular for history of ankle fracture and hardware in place. She was given injection. She continued to have trouble with neuropathy. Has been on gabapentin  but does not like the way it makes her feel.  . Denies any other pedal complaints. Denies n/v/f/c.   Past Medical History:  Diagnosis Date   Achalasia    Anxiety    Dysplasia of cervix, low grade (CIN 1)    Environmental allergies    all year long (12/27/2016)   ESRD (end stage renal disease) on dialysis (HCC)    TTS; [facility] (12/27/2016)   Fibromyalgia    GERD (gastroesophageal reflux disease)    Gout    Hypertension    IBS (irritable bowel syndrome)    MVP (mitral valve prolapse)    RA (rheumatoid arthritis) (HCC)    FOLLOWED BY DR. SHANAHAN   Raynaud's disease    Scleroderma (HCC)    Seasonal allergies    Thrombocytopenia (HCC) 07/01/2016   Acute fall to 13,000 07/01/16   Tubular adenoma 01/08/2008   CECUM   Vitamin D  deficiency     Objective:  Physical Exam: Vascular: DP/PT pulses 2/4 bilateral. CFT <3 seconds. Absent hair growth on digits. Edema noted to bilateral lower extremities. Xerosis noted bilaterally.  Skin. No lacerations or abrasions bilateral feet. Nails 1-5 bilateral  are thickened discolored and elongated  with subungual debris.  Musculoskeletal: MMT 5/5 bilateral lower extremities in DF, PF, Inversion and Eversion. Deceased ROM in DF of ankle joint. Tenderness about the whole foot and ankle on bilateral sides. More so tenderness around anterior ankle and dorsal foot mostly on the right.  Neurological: Sensation intact to light touch. Protective sensation diminished bilateral.    Assessment:   1. Peripheral polyneuropathy   2. Arthritis of right ankle      Plan:  Patient was evaluated and treated and all questions answered. Discussed neuropathy and etiology as well as treatment with patient.  Radiographs reviewed and discussed with patient.  -Discussed and educated patient on foot care, especially with  regards to the vascular, neurological and musculoskeletal systems.  -Stressed the importance of good glycemic control and the detriment of not  controlling glucose levels in relation to the foot. -Discussed ankle arthritis and continued support and injections as necessary in future.  -Discussed supportive shoes at all times and checking feet regularly.  -Discussed trying lyrica and patient refused.  -She would like to consider another NCV. Previous one she relates showed neuropathy. She has been following with [facility] but would like a second opinion on her nerve issues.  -Discussed PT to help with balance. Home health referral placed.  -Patient to return as needed    Asberry Failing, DPM

## 2023-07-28 DIAGNOSIS — N2581 Secondary hyperparathyroidism of renal origin: Secondary | ICD-10-CM | POA: Diagnosis not present

## 2023-07-28 DIAGNOSIS — D509 Iron deficiency anemia, unspecified: Secondary | ICD-10-CM | POA: Diagnosis not present

## 2023-07-28 DIAGNOSIS — Z992 Dependence on renal dialysis: Secondary | ICD-10-CM | POA: Diagnosis not present

## 2023-07-28 DIAGNOSIS — N186 End stage renal disease: Secondary | ICD-10-CM | POA: Diagnosis not present

## 2023-07-28 DIAGNOSIS — D631 Anemia in chronic kidney disease: Secondary | ICD-10-CM | POA: Diagnosis not present

## 2023-07-30 DIAGNOSIS — D631 Anemia in chronic kidney disease: Secondary | ICD-10-CM | POA: Diagnosis not present

## 2023-07-30 DIAGNOSIS — N2581 Secondary hyperparathyroidism of renal origin: Secondary | ICD-10-CM | POA: Diagnosis not present

## 2023-07-30 DIAGNOSIS — Z992 Dependence on renal dialysis: Secondary | ICD-10-CM | POA: Diagnosis not present

## 2023-07-30 DIAGNOSIS — D509 Iron deficiency anemia, unspecified: Secondary | ICD-10-CM | POA: Diagnosis not present

## 2023-07-30 DIAGNOSIS — N186 End stage renal disease: Secondary | ICD-10-CM | POA: Diagnosis not present

## 2023-08-01 DIAGNOSIS — K745 Biliary cirrhosis, unspecified: Secondary | ICD-10-CM | POA: Diagnosis not present

## 2023-08-01 DIAGNOSIS — I272 Pulmonary hypertension, unspecified: Secondary | ICD-10-CM | POA: Diagnosis not present

## 2023-08-01 DIAGNOSIS — K5900 Constipation, unspecified: Secondary | ICD-10-CM | POA: Diagnosis not present

## 2023-08-01 DIAGNOSIS — N186 End stage renal disease: Secondary | ICD-10-CM | POA: Diagnosis not present

## 2023-08-01 DIAGNOSIS — M349 Systemic sclerosis, unspecified: Secondary | ICD-10-CM | POA: Diagnosis not present

## 2023-08-01 DIAGNOSIS — R29898 Other symptoms and signs involving the musculoskeletal system: Secondary | ICD-10-CM | POA: Diagnosis not present

## 2023-08-02 DIAGNOSIS — D631 Anemia in chronic kidney disease: Secondary | ICD-10-CM | POA: Diagnosis not present

## 2023-08-02 DIAGNOSIS — Z992 Dependence on renal dialysis: Secondary | ICD-10-CM | POA: Diagnosis not present

## 2023-08-02 DIAGNOSIS — D509 Iron deficiency anemia, unspecified: Secondary | ICD-10-CM | POA: Diagnosis not present

## 2023-08-02 DIAGNOSIS — N2581 Secondary hyperparathyroidism of renal origin: Secondary | ICD-10-CM | POA: Diagnosis not present

## 2023-08-02 DIAGNOSIS — N186 End stage renal disease: Secondary | ICD-10-CM | POA: Diagnosis not present

## 2023-08-03 ENCOUNTER — Ambulatory Visit (INDEPENDENT_AMBULATORY_CARE_PROVIDER_SITE_OTHER): Admitting: Podiatry

## 2023-08-03 ENCOUNTER — Encounter: Payer: Self-pay | Admitting: Podiatry

## 2023-08-03 DIAGNOSIS — B351 Tinea unguium: Secondary | ICD-10-CM

## 2023-08-03 DIAGNOSIS — L84 Corns and callosities: Secondary | ICD-10-CM | POA: Diagnosis not present

## 2023-08-03 DIAGNOSIS — M79675 Pain in left toe(s): Secondary | ICD-10-CM

## 2023-08-03 DIAGNOSIS — D689 Coagulation defect, unspecified: Secondary | ICD-10-CM

## 2023-08-03 DIAGNOSIS — G629 Polyneuropathy, unspecified: Secondary | ICD-10-CM | POA: Diagnosis not present

## 2023-08-03 DIAGNOSIS — M79674 Pain in right toe(s): Secondary | ICD-10-CM

## 2023-08-04 DIAGNOSIS — N2581 Secondary hyperparathyroidism of renal origin: Secondary | ICD-10-CM | POA: Diagnosis not present

## 2023-08-04 DIAGNOSIS — Z992 Dependence on renal dialysis: Secondary | ICD-10-CM | POA: Diagnosis not present

## 2023-08-04 DIAGNOSIS — N186 End stage renal disease: Secondary | ICD-10-CM | POA: Diagnosis not present

## 2023-08-04 DIAGNOSIS — D631 Anemia in chronic kidney disease: Secondary | ICD-10-CM | POA: Diagnosis not present

## 2023-08-04 DIAGNOSIS — D509 Iron deficiency anemia, unspecified: Secondary | ICD-10-CM | POA: Diagnosis not present

## 2023-08-06 DIAGNOSIS — D509 Iron deficiency anemia, unspecified: Secondary | ICD-10-CM | POA: Diagnosis not present

## 2023-08-06 DIAGNOSIS — N2581 Secondary hyperparathyroidism of renal origin: Secondary | ICD-10-CM | POA: Diagnosis not present

## 2023-08-06 DIAGNOSIS — Z992 Dependence on renal dialysis: Secondary | ICD-10-CM | POA: Diagnosis not present

## 2023-08-06 DIAGNOSIS — D631 Anemia in chronic kidney disease: Secondary | ICD-10-CM | POA: Diagnosis not present

## 2023-08-06 DIAGNOSIS — N186 End stage renal disease: Secondary | ICD-10-CM | POA: Diagnosis not present

## 2023-08-07 NOTE — Progress Notes (Signed)
 Subjective:  Patient ID: Katie Nunez, female    DOB: [birth year],  MRN: [id number]  Katie Nunez presents to clinic today for at risk foot care with history of peripheral neuropathy and callus(es) of both feet and painful mycotic toenails that are difficult to trim. Painful toenails interfere with ambulation. Aggravating factors include wearing enclosed shoe gear. Pain is relieved with periodic professional debridement. Painful calluses are aggravated when weightbearing with and without shoegear. Pain is relieved with periodic professional debridement.  Chief Complaint  Patient presents with   RFC     Non diabetic toenail trim. LOV with PCP 07/2023.   New problem(s): None.   PCP is Claudene Pellet, MD.  Allergies  Allergen Reactions   Other Anaphylaxis and Other (See Comments)    Do not use polyflux membrane.  Use alternate heart racing   Iron  Other (See Comments)    Other Reaction(s): Unknown    Due to liver disease   Nitrofuran Derivatives     Advised not to take   Savella [Milnacipran Hcl] Palpitations and Other (See Comments)    Unknown   Tape Rash and Other (See Comments)    Itch- unsure if it was paper or adhesive tape    Review of Systems: Negative except as noted in the HPI.  Objective: No changes noted in today's physical examination. There were no vitals filed for this visit. Katie Nunez is a pleasant 68 y.o. female in NAD. AAO x 3.  Vascular Examination: Capillary refill time immediate b/l. Palpable pedal pulses. Pedal edema b/l. No pain with calf compression b/l. Skin temperature gradient WNL b/l. No cyanosis or clubbing b/l. No ischemia or gangrene noted b/l. Pedal hair absent.  Neurological Examination:  Protective sensation diminished with 10g monofilament b/l.  Dermatological Examination: Pedal skin with normal turgor, texture and tone b/l.  No open wounds. No interdigital macerations.   Toenails 1-5 b/l thick, discolored, elongated with  subungual debris and pain on dorsal palpation.   Hyperkeratotic lesion(s) right heel, submet head 2 left foot, and 1st metatarsal head left foot.  No erythema, no edema, no drainage, no fluctuance.  Musculoskeletal Examination: Muscle strength 5/5 to all lower extremity muscle groups bilaterally. Pes planus deformity noted bilateral LE. Utilizes wheelchair for mobility assistance.  Radiographs: None  Assessment/Plan: 1. Pain due to onychomycosis of toenails of both feet   2. Callus   3. Peripheral polyneuropathy   4. Coagulation defect, unspecified (HCC)    Consent given for treatment. Patient examined. All patient's and/or POA's questions/concerns addressed on today's visit. Mycotic toenails 1-5 debrided in length and girth without incident. Callus(es) right heel, submet head 2 left foot, and 1st metatarsal head left foot pared with sharp debridement without incident.Continue soft, supportive shoe gear daily. Report any pedal injuries to medical professional. Call office if there are any quesitons/concerns. -Patient/POA to call should there be question/concern in the interim.   Return in about 3 months (around 11/03/2023).  Katie Nunez, DPM      [facility] LOCATION: [address], [place] [address]                   Office [phone]   [place] LOCATION: [phone] Katie  [place]une[place], [place] [address] Office [phone]

## 2023-08-09 ENCOUNTER — Encounter: Payer: Self-pay | Admitting: Neurology

## 2023-08-09 DIAGNOSIS — Z992 Dependence on renal dialysis: Secondary | ICD-10-CM | POA: Diagnosis not present

## 2023-08-09 DIAGNOSIS — N186 End stage renal disease: Secondary | ICD-10-CM | POA: Diagnosis not present

## 2023-08-09 DIAGNOSIS — N2581 Secondary hyperparathyroidism of renal origin: Secondary | ICD-10-CM | POA: Diagnosis not present

## 2023-08-09 DIAGNOSIS — D509 Iron deficiency anemia, unspecified: Secondary | ICD-10-CM | POA: Diagnosis not present

## 2023-08-09 DIAGNOSIS — D631 Anemia in chronic kidney disease: Secondary | ICD-10-CM | POA: Diagnosis not present

## 2023-08-11 DIAGNOSIS — Z992 Dependence on renal dialysis: Secondary | ICD-10-CM | POA: Diagnosis not present

## 2023-08-11 DIAGNOSIS — N269 Renal sclerosis, unspecified: Secondary | ICD-10-CM | POA: Diagnosis not present

## 2023-08-11 DIAGNOSIS — N186 End stage renal disease: Secondary | ICD-10-CM | POA: Diagnosis not present

## 2023-08-11 DIAGNOSIS — N2581 Secondary hyperparathyroidism of renal origin: Secondary | ICD-10-CM | POA: Diagnosis not present

## 2023-08-11 DIAGNOSIS — I50812 Chronic right heart failure: Secondary | ICD-10-CM | POA: Diagnosis not present

## 2023-08-11 DIAGNOSIS — D631 Anemia in chronic kidney disease: Secondary | ICD-10-CM | POA: Diagnosis not present

## 2023-08-11 DIAGNOSIS — E782 Mixed hyperlipidemia: Secondary | ICD-10-CM | POA: Diagnosis not present

## 2023-08-11 DIAGNOSIS — D509 Iron deficiency anemia, unspecified: Secondary | ICD-10-CM | POA: Diagnosis not present

## 2023-08-11 DIAGNOSIS — I10 Essential (primary) hypertension: Secondary | ICD-10-CM | POA: Diagnosis not present

## 2023-08-12 ENCOUNTER — Encounter: Payer: Self-pay | Admitting: Podiatry

## 2023-08-13 DIAGNOSIS — N186 End stage renal disease: Secondary | ICD-10-CM | POA: Diagnosis not present

## 2023-08-13 DIAGNOSIS — D509 Iron deficiency anemia, unspecified: Secondary | ICD-10-CM | POA: Diagnosis not present

## 2023-08-13 DIAGNOSIS — T7840XA Allergy, unspecified, initial encounter: Secondary | ICD-10-CM | POA: Diagnosis not present

## 2023-08-13 DIAGNOSIS — N2581 Secondary hyperparathyroidism of renal origin: Secondary | ICD-10-CM | POA: Diagnosis not present

## 2023-08-13 DIAGNOSIS — I272 Pulmonary hypertension, unspecified: Secondary | ICD-10-CM | POA: Diagnosis not present

## 2023-08-13 DIAGNOSIS — D631 Anemia in chronic kidney disease: Secondary | ICD-10-CM | POA: Diagnosis not present

## 2023-08-13 DIAGNOSIS — Z992 Dependence on renal dialysis: Secondary | ICD-10-CM | POA: Diagnosis not present

## 2023-08-15 ENCOUNTER — Ambulatory Visit: Admitting: Podiatry

## 2023-08-16 DIAGNOSIS — T7840XA Allergy, unspecified, initial encounter: Secondary | ICD-10-CM | POA: Diagnosis not present

## 2023-08-16 DIAGNOSIS — D509 Iron deficiency anemia, unspecified: Secondary | ICD-10-CM | POA: Diagnosis not present

## 2023-08-16 DIAGNOSIS — D631 Anemia in chronic kidney disease: Secondary | ICD-10-CM | POA: Diagnosis not present

## 2023-08-16 DIAGNOSIS — I272 Pulmonary hypertension, unspecified: Secondary | ICD-10-CM | POA: Diagnosis not present

## 2023-08-16 DIAGNOSIS — N2581 Secondary hyperparathyroidism of renal origin: Secondary | ICD-10-CM | POA: Diagnosis not present

## 2023-08-16 DIAGNOSIS — N186 End stage renal disease: Secondary | ICD-10-CM | POA: Diagnosis not present

## 2023-08-16 DIAGNOSIS — Z992 Dependence on renal dialysis: Secondary | ICD-10-CM | POA: Diagnosis not present

## 2023-08-18 DIAGNOSIS — T7840XA Allergy, unspecified, initial encounter: Secondary | ICD-10-CM | POA: Diagnosis not present

## 2023-08-18 DIAGNOSIS — I272 Pulmonary hypertension, unspecified: Secondary | ICD-10-CM | POA: Diagnosis not present

## 2023-08-18 DIAGNOSIS — N2581 Secondary hyperparathyroidism of renal origin: Secondary | ICD-10-CM | POA: Diagnosis not present

## 2023-08-18 DIAGNOSIS — D509 Iron deficiency anemia, unspecified: Secondary | ICD-10-CM | POA: Diagnosis not present

## 2023-08-18 DIAGNOSIS — D631 Anemia in chronic kidney disease: Secondary | ICD-10-CM | POA: Diagnosis not present

## 2023-08-18 DIAGNOSIS — Z992 Dependence on renal dialysis: Secondary | ICD-10-CM | POA: Diagnosis not present

## 2023-08-18 DIAGNOSIS — N186 End stage renal disease: Secondary | ICD-10-CM | POA: Diagnosis not present

## 2023-08-19 DIAGNOSIS — M349 Systemic sclerosis, unspecified: Secondary | ICD-10-CM | POA: Diagnosis not present

## 2023-08-19 DIAGNOSIS — M109 Gout, unspecified: Secondary | ICD-10-CM | POA: Diagnosis not present

## 2023-08-19 DIAGNOSIS — Z992 Dependence on renal dialysis: Secondary | ICD-10-CM | POA: Diagnosis not present

## 2023-08-19 DIAGNOSIS — Z7982 Long term (current) use of aspirin: Secondary | ICD-10-CM | POA: Diagnosis not present

## 2023-08-19 DIAGNOSIS — M069 Rheumatoid arthritis, unspecified: Secondary | ICD-10-CM | POA: Diagnosis not present

## 2023-08-19 DIAGNOSIS — I120 Hypertensive chronic kidney disease with stage 5 chronic kidney disease or end stage renal disease: Secondary | ICD-10-CM | POA: Diagnosis not present

## 2023-08-19 DIAGNOSIS — N186 End stage renal disease: Secondary | ICD-10-CM | POA: Diagnosis not present

## 2023-08-19 DIAGNOSIS — Z9181 History of falling: Secondary | ICD-10-CM | POA: Diagnosis not present

## 2023-08-19 DIAGNOSIS — I272 Pulmonary hypertension, unspecified: Secondary | ICD-10-CM | POA: Diagnosis not present

## 2023-08-19 DIAGNOSIS — D631 Anemia in chronic kidney disease: Secondary | ICD-10-CM | POA: Diagnosis not present

## 2023-08-19 DIAGNOSIS — M797 Fibromyalgia: Secondary | ICD-10-CM | POA: Diagnosis not present

## 2023-08-19 DIAGNOSIS — I4891 Unspecified atrial fibrillation: Secondary | ICD-10-CM | POA: Diagnosis not present

## 2023-08-19 DIAGNOSIS — K745 Biliary cirrhosis, unspecified: Secondary | ICD-10-CM | POA: Diagnosis not present

## 2023-08-19 DIAGNOSIS — E538 Deficiency of other specified B group vitamins: Secondary | ICD-10-CM | POA: Diagnosis not present

## 2023-08-19 DIAGNOSIS — R29898 Other symptoms and signs involving the musculoskeletal system: Secondary | ICD-10-CM | POA: Diagnosis not present

## 2023-08-19 DIAGNOSIS — K219 Gastro-esophageal reflux disease without esophagitis: Secondary | ICD-10-CM | POA: Diagnosis not present

## 2023-08-19 DIAGNOSIS — I081 Rheumatic disorders of both mitral and tricuspid valves: Secondary | ICD-10-CM | POA: Diagnosis not present

## 2023-08-19 DIAGNOSIS — Z9981 Dependence on supplemental oxygen: Secondary | ICD-10-CM | POA: Diagnosis not present

## 2023-08-19 DIAGNOSIS — I69198 Other sequelae of nontraumatic intracerebral hemorrhage: Secondary | ICD-10-CM | POA: Diagnosis not present

## 2023-08-19 DIAGNOSIS — I69151 Hemiplegia and hemiparesis following nontraumatic intracerebral hemorrhage affecting right dominant side: Secondary | ICD-10-CM | POA: Diagnosis not present

## 2023-08-19 DIAGNOSIS — I7300 Raynaud's syndrome without gangrene: Secondary | ICD-10-CM | POA: Diagnosis not present

## 2023-08-20 DIAGNOSIS — I272 Pulmonary hypertension, unspecified: Secondary | ICD-10-CM | POA: Diagnosis not present

## 2023-08-20 DIAGNOSIS — D631 Anemia in chronic kidney disease: Secondary | ICD-10-CM | POA: Diagnosis not present

## 2023-08-20 DIAGNOSIS — D509 Iron deficiency anemia, unspecified: Secondary | ICD-10-CM | POA: Diagnosis not present

## 2023-08-20 DIAGNOSIS — Z992 Dependence on renal dialysis: Secondary | ICD-10-CM | POA: Diagnosis not present

## 2023-08-20 DIAGNOSIS — T7840XA Allergy, unspecified, initial encounter: Secondary | ICD-10-CM | POA: Diagnosis not present

## 2023-08-20 DIAGNOSIS — N2581 Secondary hyperparathyroidism of renal origin: Secondary | ICD-10-CM | POA: Diagnosis not present

## 2023-08-20 DIAGNOSIS — N186 End stage renal disease: Secondary | ICD-10-CM | POA: Diagnosis not present

## 2023-08-23 DIAGNOSIS — D509 Iron deficiency anemia, unspecified: Secondary | ICD-10-CM | POA: Diagnosis not present

## 2023-08-23 DIAGNOSIS — T7840XA Allergy, unspecified, initial encounter: Secondary | ICD-10-CM | POA: Diagnosis not present

## 2023-08-23 DIAGNOSIS — N2581 Secondary hyperparathyroidism of renal origin: Secondary | ICD-10-CM | POA: Diagnosis not present

## 2023-08-23 DIAGNOSIS — N186 End stage renal disease: Secondary | ICD-10-CM | POA: Diagnosis not present

## 2023-08-23 DIAGNOSIS — D631 Anemia in chronic kidney disease: Secondary | ICD-10-CM | POA: Diagnosis not present

## 2023-08-23 DIAGNOSIS — I272 Pulmonary hypertension, unspecified: Secondary | ICD-10-CM | POA: Diagnosis not present

## 2023-08-23 DIAGNOSIS — Z992 Dependence on renal dialysis: Secondary | ICD-10-CM | POA: Diagnosis not present

## 2023-08-24 ENCOUNTER — Encounter: Admitting: Physical Therapy

## 2023-08-24 DIAGNOSIS — K745 Biliary cirrhosis, unspecified: Secondary | ICD-10-CM | POA: Diagnosis not present

## 2023-08-24 DIAGNOSIS — N186 End stage renal disease: Secondary | ICD-10-CM | POA: Diagnosis not present

## 2023-08-24 DIAGNOSIS — I69198 Other sequelae of nontraumatic intracerebral hemorrhage: Secondary | ICD-10-CM | POA: Diagnosis not present

## 2023-08-24 DIAGNOSIS — E0800 Diabetes mellitus due to underlying condition with hyperosmolarity without nonketotic hyperglycemic-hyperosmolar coma (NKHHC): Secondary | ICD-10-CM | POA: Diagnosis not present

## 2023-08-24 DIAGNOSIS — I081 Rheumatic disorders of both mitral and tricuspid valves: Secondary | ICD-10-CM | POA: Diagnosis not present

## 2023-08-24 DIAGNOSIS — K766 Portal hypertension: Secondary | ICD-10-CM | POA: Diagnosis not present

## 2023-08-24 DIAGNOSIS — K219 Gastro-esophageal reflux disease without esophagitis: Secondary | ICD-10-CM | POA: Diagnosis not present

## 2023-08-24 DIAGNOSIS — I120 Hypertensive chronic kidney disease with stage 5 chronic kidney disease or end stage renal disease: Secondary | ICD-10-CM | POA: Diagnosis not present

## 2023-08-24 DIAGNOSIS — K7460 Unspecified cirrhosis of liver: Secondary | ICD-10-CM | POA: Diagnosis not present

## 2023-08-24 DIAGNOSIS — M109 Gout, unspecified: Secondary | ICD-10-CM | POA: Diagnosis not present

## 2023-08-24 DIAGNOSIS — M797 Fibromyalgia: Secondary | ICD-10-CM | POA: Diagnosis not present

## 2023-08-24 DIAGNOSIS — M349 Systemic sclerosis, unspecified: Secondary | ICD-10-CM | POA: Diagnosis not present

## 2023-08-24 DIAGNOSIS — D631 Anemia in chronic kidney disease: Secondary | ICD-10-CM | POA: Diagnosis not present

## 2023-08-24 DIAGNOSIS — M069 Rheumatoid arthritis, unspecified: Secondary | ICD-10-CM | POA: Diagnosis not present

## 2023-08-24 DIAGNOSIS — Z7982 Long term (current) use of aspirin: Secondary | ICD-10-CM | POA: Diagnosis not present

## 2023-08-24 DIAGNOSIS — R29898 Other symptoms and signs involving the musculoskeletal system: Secondary | ICD-10-CM | POA: Diagnosis not present

## 2023-08-24 DIAGNOSIS — I69151 Hemiplegia and hemiparesis following nontraumatic intracerebral hemorrhage affecting right dominant side: Secondary | ICD-10-CM | POA: Diagnosis not present

## 2023-08-24 DIAGNOSIS — Z9981 Dependence on supplemental oxygen: Secondary | ICD-10-CM | POA: Diagnosis not present

## 2023-08-24 DIAGNOSIS — E538 Deficiency of other specified B group vitamins: Secondary | ICD-10-CM | POA: Diagnosis not present

## 2023-08-24 DIAGNOSIS — Z992 Dependence on renal dialysis: Secondary | ICD-10-CM | POA: Diagnosis not present

## 2023-08-24 DIAGNOSIS — I7300 Raynaud's syndrome without gangrene: Secondary | ICD-10-CM | POA: Diagnosis not present

## 2023-08-24 DIAGNOSIS — Z9181 History of falling: Secondary | ICD-10-CM | POA: Diagnosis not present

## 2023-08-24 DIAGNOSIS — I272 Pulmonary hypertension, unspecified: Secondary | ICD-10-CM | POA: Diagnosis not present

## 2023-08-24 DIAGNOSIS — I4891 Unspecified atrial fibrillation: Secondary | ICD-10-CM | POA: Diagnosis not present

## 2023-08-24 DIAGNOSIS — E7800 Pure hypercholesterolemia, unspecified: Secondary | ICD-10-CM | POA: Diagnosis not present

## 2023-08-25 DIAGNOSIS — I272 Pulmonary hypertension, unspecified: Secondary | ICD-10-CM | POA: Diagnosis not present

## 2023-08-25 DIAGNOSIS — N186 End stage renal disease: Secondary | ICD-10-CM | POA: Diagnosis not present

## 2023-08-25 DIAGNOSIS — Z992 Dependence on renal dialysis: Secondary | ICD-10-CM | POA: Diagnosis not present

## 2023-08-25 DIAGNOSIS — D509 Iron deficiency anemia, unspecified: Secondary | ICD-10-CM | POA: Diagnosis not present

## 2023-08-25 DIAGNOSIS — D631 Anemia in chronic kidney disease: Secondary | ICD-10-CM | POA: Diagnosis not present

## 2023-08-25 DIAGNOSIS — N2581 Secondary hyperparathyroidism of renal origin: Secondary | ICD-10-CM | POA: Diagnosis not present

## 2023-08-25 DIAGNOSIS — T7840XA Allergy, unspecified, initial encounter: Secondary | ICD-10-CM | POA: Diagnosis not present

## 2023-08-25 NOTE — Progress Notes (Deleted)
 Initial neurology clinic note  Reason for Evaluation: Consultation requested by Sikora, Rebecca, DPM for an opinion regarding neuropathy. My final recommendations will be communicated back to the requesting physician by way of shared medical record or letter to requesting physician via [place]  mail.  HPI: This is [name], a 68 y.o. ***-handed female with a medical history of stroke (2021 left frontal IPH), HTN, HLD, ESRD on dialysis, cirrhosis, RA, scleroderma, fibromyalgia, gout, mitral valve prolapse, GERD, anxiety, vit D deficiency*** who presents to neurology clinic with the chief complaint of ***. The patient is accompanied by ***.  *** Neuropathy On gabapentin  but does not like the way it makes her feel Podiatry discussed Lyrica but patient refused Wants 2nd opinion on neuropathy and another EMG?  Patient was previously being seen by Dr. Rosemarie since ICH due to Warfarin on 12/12/2019. Was on warfarin then maybe eliquis  - but now just asa 81 mg? Does she have afib?*** She had an EMG by Dr. Letha on 03/10/22 that per notes showed sensorimotor neuropathy on the basis of absent bilateral sural sensory responses and chronic neurogenic changes in EHL, AH, and EDB from what I can tell of handwritten report..  On topamax  25 mg BID? On any supplements?***  The patient has not*** had similar episodes of symptoms in the past. ***  Muscle bulk loss? *** Muscle pain? ***  Cramps/Twitching? *** Suggestion of myotonia/difficulty relaxing after contraction? ***  Fatigable weakness?*** Does strength improve after brief exercise?***  Able to brush hair/teeth without difficulty? *** Able to button shirts/use zips? *** Clumsiness/dropping grasped objects?*** Can you arise from squatted position easily? *** Able to get out of chair without using arms? *** Able to walk up steps easily? *** Use an assistive device to walk? *** Significant imbalance with walking? *** Falls?*** Any change in  urine color, especially after exertion/physical activity? ***  The patient denies*** symptoms suggestive of oculobulbar weakness including diplopia, ptosis, dysphagia, poor saliva control, dysarthria/dysphonia, impaired mastication, facial weakness/droop.  There are no*** neuromuscular respiratory weakness symptoms, particularly orthopnea>dyspnea.   Pseudobulbar affect is absent***.  The patient does not*** report symptoms referable to autonomic dysfunction including impaired sweating, heat or cold intolerance, excessive mucosal dryness, gastroparetic early satiety, postprandial abdominal bloating, constipation, bowel or bladder dyscontrol, erectile dysfunction*** or syncope/presyncope/orthostatic intolerance.  There are no*** complaints relating to other symptoms of small fiber modalities including paresthesia/pain.  The patient has not *** noticed any recent skin rashes nor does he*** report any constitutional symptoms like fever, night sweats, anorexia or unintentional weight loss.  EtOH use: ***  Restrictive diet? *** Family history of neuropathy/myopathy/NM disease?***  Previous labs, electrodiagnostics, and neuroimaging are summarized below, but pertinent findings include***  Any biopsy done? *** Current medications being tried for the patient's symptoms include ***  Prior medications that have been tried: ***   MEDICATIONS:  Outpatient Encounter Medications as of 08/26/2023  Medication Sig Note   acetaminophen  (TYLENOL ) 325 MG tablet Take 650 mg by mouth every 6 (six) hours as needed for mild pain (pain score 1-3), moderate pain (pain score 4-6) or headache.    albuterol  (VENTOLIN  HFA) 108 (90 Base) MCG/ACT inhaler INHALE 2 PUFFS INTO THE LUNGS EVERY 6 HOURS AS NEEDED FOR WHEEZING OR SHORTNESS OF BREATH    ambrisentan  (LETAIRIS ) 10 MG tablet Take 10 mg by mouth daily.    aspirin  EC 81 MG tablet Take 1 tablet (81 mg total) by mouth daily. Swallow whole.    atorvastatin  (LIPITOR )  80  MG tablet Take 1 tablet (80 mg total) by mouth daily.    AURYXIA  1 GM 210 MG(Fe) tablet 8 tablets.    azelastine  (ASTELIN ) 0.1 % nasal spray Place 1 spray into both nostrils 2 (two) times daily as needed for rhinitis. Use in each nostril as directed    bisacodyl  (DULCOLAX) 5 MG EC tablet Take 1 tablet (5 mg total) by mouth daily as needed for severe constipation.    camphor-menthol  (SARNA) lotion Apply topically 2 (two) times daily. (Patient taking differently: Apply 1 Application topically as needed for itching.)    cinacalcet  (SENSIPAR ) 60 MG tablet Take 60 mg by mouth Every Tuesday,Thursday,and Saturday with dialysis.    clonazePAM  (KLONOPIN ) 0.5 MG tablet Take 1 tablet (0.5 mg total) by mouth 2 (two) times daily as needed for anxiety.    COMIRNATY syringe     Cyanocobalamin  (B-12 PO) Take 100 mcg by mouth daily.    cyclobenzaprine  (FLEXERIL ) 5 MG tablet Take 1 tablet (5 mg total) by mouth every 8 (eight) hours as needed for muscle spasms. (Patient taking differently: Take 10 mg by mouth every 8 (eight) hours as needed for muscle spasms.)    docusate sodium  (COLACE) 100 MG capsule Take 2 capsules (200 mg total) by mouth daily. (Patient taking differently: Take 200 mg by mouth daily as needed for mild constipation or moderate constipation.)    famotidine  (PEPCID ) 20 MG tablet Take 1 tablet (20 mg total) by mouth daily as needed for heartburn or indigestion. (Patient taking differently: Take 40 mg by mouth 2 (two) times daily as needed for heartburn or indigestion.)    fluticasone  (FLONASE ) 50 MCG/ACT nasal spray Place 1 spray into both nostrils daily for 7 days. (Patient taking differently: Place 1 spray into both nostrils daily as needed for allergies or rhinitis.)    folic acid  (FOLVITE ) 1 MG tablet Take 1 tablet (1 mg total) by mouth daily.    hydrOXYzine  (ATARAX /VISTARIL ) 25 MG tablet Take 25 mg by mouth every 8 (eight) hours as needed for itching.     hyoscyamine  (ANASPAZ ) 0.125 MG TBDP  disintergrating tablet Place 0.125 mg under the tongue every 4 (four) hours as needed for cramping.    lanthanum (FOSRENOL) 1000 MG chewable tablet Chew 1,000-2,000 mg by mouth 3 (three) times daily with meals. Snack 1000 mg    levocetirizine (XYZAL ) 5 MG tablet Take 5 mg by mouth every evening. 01/31/2023: OTC   linaclotide (LINZESS) 72 MCG capsule Take 72 mcg by mouth.    Methoxy PEG-Epoetin  Beta (MIRCERA IJ) Mircera    multivitamin (RENA-VIT) TABS tablet Take 1 tablet by mouth daily at 6 (six) AM.    pilocarpine (SALAGEN) 5 MG tablet Take 5 mg by mouth.    polyethylene glycol (MIRALAX  / GLYCOLAX ) 17 g packet Take 17 g by mouth 2 (two) times daily. (Patient taking differently: Take 15 g by mouth daily as needed for mild constipation or moderate constipation.)    Selexipag  (UPTRAVI ) 800 MCG TABS Take 1 tablet (800 mcg total) by mouth in the morning and at bedtime.    Tiotropium Bromide-Olodaterol (STIOLTO RESPIMAT ) 2.5-2.5 MCG/ACT AERS Inhale 2 puffs into the lungs daily.    tiZANidine (ZANAFLEX) 4 MG tablet Take 4 mg by mouth.    topiramate  (TOPAMAX ) 25 MG tablet Take 1 tablet (25 mg total) by mouth 2 (two) times daily.    WINREVAIR subcutaneous injection Inject into the skin every 21 ( twenty-one) days.    No facility-administered encounter medications on file as  of 08/26/2023.    PAST MEDICAL HISTORY: Past Medical History:  Diagnosis Date   Achalasia    Anxiety    Dysplasia of cervix, low grade (CIN 1)    Environmental allergies    all year long (12/27/2016)   ESRD (end stage renal disease) on dialysis (HCC)    TTS; [facility] (12/27/2016)   Fibromyalgia    GERD (gastroesophageal reflux disease)    Gout    Hypertension    IBS (irritable bowel syndrome)    MVP (mitral valve prolapse)    RA (rheumatoid arthritis) (HCC)    FOLLOWED BY DR. SHANAHAN   Raynaud's disease    Scleroderma (HCC)    Seasonal allergies    Thrombocytopenia (HCC) 07/01/2016   Acute fall to 13,000 07/01/16    Tubular adenoma 01/08/2008   CECUM   Vitamin D  deficiency     PAST SURGICAL HISTORY: Past Surgical History:  Procedure Laterality Date   A/V FISTULAGRAM N/A 02/02/2023   Procedure: A/V Fistulagram;  Surgeon: Norine Manuelita LABOR, MD;  Location: [facility];  Service: Cardiovascular;  Laterality: N/A;   ANKLE FRACTURE SURGERY Right    AV FISTULA PLACEMENT Left 06/28/2016   Procedure: left arm ARTERIOVENOUS (AV) FISTULA CREATION;  Surgeon: Oris Krystal FALCON, MD;  Location: [facility] OR;  Service: Vascular;  Laterality: Left;   BASCILIC VEIN TRANSPOSITION Left 09/27/2016   Procedure: LEFT UPPER ARM CEPHALIC VEIN TRANSPOSITION;  Surgeon: Oris Krystal FALCON, MD;  Location: [facility] OR;  Service: Vascular;  Laterality: Left;   BREAST BIOPSY     ? side   CESAREAN SECTION  1994   CO2 LASER OF CERVIX     COLONOSCOPY W/ BIOPSIES  01/08/2008   INSERTION OF DIALYSIS CATHETER Right 06/28/2016   Procedure: INSERTION OF DIALYSIS CATHETER, right internal jugular;  Surgeon: Oris Krystal FALCON, MD;  Location: [facility] OR;  Service: Vascular;  Laterality: Right;   MYOMECTOMY     NASAL ENDOSCOPY WITH EPISTAXIS CONTROL N/A 12/29/2019   Procedure: NASAL ENDOSCOPY WITH EPISTAXIS CONTROL;  Surgeon: Karis Clunes, MD;  Location: [facility] OR;  Service: ENT;  Laterality: N/A;   PELVIC LAPAROSCOPY  2011   superficial thrombophlebitis Left 07-2014    ALLERGIES: Allergies  Allergen Reactions   Other Anaphylaxis and Other (See Comments)    Do not use polyflux membrane.  Use alternate heart racing   Iron  Other (See Comments)    Other Reaction(s): Unknown    Due to liver disease   Nitrofuran Derivatives     Advised not to take   Savella [Milnacipran Hcl] Palpitations and Other (See Comments)    Unknown   Tape Rash and Other (See Comments)    Itch- unsure if it was paper or adhesive tape    FAMILY HISTORY: Family History  Problem Relation Age of Onset   Cerebral aneurysm Mother    Hypertension Mother    Diabetes Mother    Heart disease  Father    Skin telangiectasia Maternal Aunt        possible telangiectasia, patient recalls reddish birthmarks   Heart disease Maternal Aunt    Diabetes Maternal Grandmother    Heart disease Paternal Grandfather    Cerebral palsy Cousin        1ST COUSIN?   Diabetes Paternal Grandmother    Heart attack Maternal Grandfather    Colon cancer Maternal Uncle        older than 61 yo at diagnosis   Cerebral palsy Other    Stomach cancer  Paternal Uncle     SOCIAL HISTORY: Social History   Tobacco Use   Smoking status: Never   Smokeless tobacco: Never  Vaping Use   Vaping status: Never Used  Substance Use Topics   Alcohol use: No   Drug use: No   Social History   Social History Narrative   Lives in [place]     OBJECTIVE: PHYSICAL EXAM: LMP 11/15/2008   General:*** General appearance: Awake and alert. No distress. Cooperative with exam.  Skin: No obvious rash or jaundice. HEENT: Atraumatic. Anicteric. Lungs: Non-labored breathing on room air  Heart: Regular Abdomen: Soft, non tender. Extremities: No edema. No obvious deformity.  Musculoskeletal: No obvious joint swelling. Psych: Affect appropriate.  Neurological: Mental Status: Alert. Speech fluent. No pseudobulbar affect Cranial Nerves: CNII: No RAPD. Visual fields grossly intact. CNIII, IV, VI: PERRL. No nystagmus. EOMI. CN V: Facial sensation intact bilaterally to fine touch. [place] clench strong. Jaw jerk***. CN VII: Facial muscles symmetric and strong. No ptosis at rest or after sustained upgaze***. CN VIII: Hearing grossly intact bilaterally. CN IX: No hypophonia. CN X: Palate elevates symmetrically. CN XI: Full strength shoulder shrug bilaterally. CN XII: Tongue protrusion full and midline. No atrophy or fasciculations. No significant dysarthria*** Motor: Tone is ***. *** fasciculations in *** extremities. *** atrophy. No grip or percussive myotonia.***  Individual muscle group testing (MRC  grade out of 5):  Movement     Neck flexion ***    Neck extension ***     Right Left   Shoulder abduction *** ***   Shoulder adduction *** ***   Shoulder ext rotation *** ***   Shoulder int rotation *** ***   Elbow flexion *** ***   Elbow extension *** ***   Wrist extension *** ***   Wrist flexion *** ***   Finger abduction - FDI *** ***   Finger abduction - ADM *** ***   Finger extension *** ***   Finger distal flexion - 2/3 *** ***   Finger distal flexion - 4/5 *** ***   Thumb flexion - FPL *** ***   Thumb abduction - APB *** ***    Hip flexion *** ***   Hip extension *** ***   Hip adduction *** ***   Hip abduction *** ***   Knee extension *** ***   Knee flexion *** ***   Dorsiflexion *** ***   Plantarflexion *** ***   Inversion *** ***   Eversion *** ***   Great toe extension *** ***   Great toe flexion *** ***     Reflexes:  Right Left   Bicep *** ***   Tricep *** ***   BrRad *** ***   Knee *** ***   Ankle *** ***    Pathological Reflexes: Babinski: *** response bilaterally*** Hoffman: *** Troemner: *** Pectoral: *** Palmomental: *** Facial: *** Midline tap: *** Sensation: Pinprick: *** Vibration: *** Temperature: *** Proprioception: *** Coordination: Intact finger-to- nose-finger bilaterally. Romberg negative.*** Gait: Able to rise from chair with arms crossed unassisted. Normal, narrow-based gait. Able to tandem walk. Able to walk on toes and heels.***  Lab and Test Review: Internal labs: 06/29/23: Lipid panel: tChol 156, LDL 57, TG 91 CBC w/ diff significant for Hb 9.2, MCV 90.5 CMP significant for BUN 34, Cr 7.45 B12: 621 Folate wnl  10/15/22: B12: 501 MMA elevated to 951  04/28/22: HbA1c: 5.4 Neuropathy panel: B12 441, No M spike, TSH wnl, Vit D low at 12.2  External labs: ***  Imaging/Procedures:  MRI/MRA brain wo contrast (12/13/19): FINDINGS: MRI HEAD FINDINGS   Brain: T1 and T2 hyperintense hematoma in the high  subcortical posterior left frontal lobe which measures 3 x 1.8 cm on axial slices. Subcentimeter adjacent hemorrhage is present within a single rim of vasogenic edema. No underlying infarct is seen. No gross masslike finding detected. There are other remote microhemorrhages with nodular hemosiderin staining seen in the parasagittal right frontal lobe, bilateral anterior frontal white matter, and along the high and lateral left frontal lobe. No generalized chronic white matter disease. No hydrocephalus or extra-axial collection.   Vascular: Arterial findings below. Normal dural venous sinus flow voids.   Skull and upper cervical spine: Hypointense cervical marrow with mild clivus heterogeneity, likely red marrow reconversion in this dialysis patient.   Sinuses/Orbits: Proteinaceous retention cysts in the right maxillary sinus.   Other: Nodular appearance of the bilateral parotid glands with a 9 mm T1 hyperintense mass in the superficial left parotid. Small T2 hyperintense cysts in the right parotid. There is known history of scleroderma.   MRA HEAD FINDINGS   Vessels are smooth and widely patent.  No aneurysm or stenosis.   IMPRESSION: Brain MRI: Known acute hemorrhage in the subcortical left frontal white matter with rim of edema. A few remote hemorrhages have also occurred in the bilateral upper cerebrum suggesting underlying vasculopathy or bleeding diathesis. Notable lack of chronic white matter disease.   Intracranial MRA:   Negative.  CT head wo contrast (03/25/20): CT head without contrast demonstrating: - Resolving left frontal intracerebral hemorrhage, with mild residual hypodensity  EMG by Dr. Letha on 03/10/22  Absent bilateral sural sensory responses and chronic neurogenic changes in EHL, AH, and EDB from what I can tell of handwritten report.  ASSESSMENT: Katie Nunez is a 68 y.o. female who presents for evaluation of ***. *** has a relevant medical  history of ***. *** neurological examination is pertinent for ***. Available diagnostic data is significant for ***. This constellation of symptoms and objective data would most likely localize to ***. ***  PLAN: -Blood work: ***MMA, B1 ***B12 injections? ***Repeat EMG  -Return to clinic ***  The impression above as well as the plan as outlined below were extensively discussed with the patient (in the company of ***) who voiced understanding. All questions were answered to their satisfaction.  The patient was counseled on pertinent fall precautions per the printed material provided today, and as noted under the Patient Instructions section below.***  When available, results of the above investigations and possible further recommendations will be communicated to the patient via telephone/MyChart. Patient to call office if not contacted after expected testing turnaround time.   Total time spent reviewing records, interview, history/exam, documentation, and coordination of care on day of encounter:  *** min   Thank you for allowing me to participate in patient's care.  If I can answer any additional questions, I would be pleased to do so.  Venetia Potters, MD   CC: Claudene Pellet, MD [address] [place] [address]  CC: Referring provider: Joya Stabs, DPM [address] [place],  [place] [address]

## 2023-08-26 ENCOUNTER — Ambulatory Visit: Admitting: Neurology

## 2023-08-26 ENCOUNTER — Encounter: Payer: Self-pay | Admitting: Neurology

## 2023-08-26 DIAGNOSIS — K745 Biliary cirrhosis, unspecified: Secondary | ICD-10-CM | POA: Diagnosis not present

## 2023-08-26 DIAGNOSIS — E538 Deficiency of other specified B group vitamins: Secondary | ICD-10-CM | POA: Diagnosis not present

## 2023-08-26 DIAGNOSIS — I120 Hypertensive chronic kidney disease with stage 5 chronic kidney disease or end stage renal disease: Secondary | ICD-10-CM | POA: Diagnosis not present

## 2023-08-26 DIAGNOSIS — M797 Fibromyalgia: Secondary | ICD-10-CM | POA: Diagnosis not present

## 2023-08-26 DIAGNOSIS — Z7982 Long term (current) use of aspirin: Secondary | ICD-10-CM | POA: Diagnosis not present

## 2023-08-26 DIAGNOSIS — Z9981 Dependence on supplemental oxygen: Secondary | ICD-10-CM | POA: Diagnosis not present

## 2023-08-26 DIAGNOSIS — I272 Pulmonary hypertension, unspecified: Secondary | ICD-10-CM | POA: Diagnosis not present

## 2023-08-26 DIAGNOSIS — I081 Rheumatic disorders of both mitral and tricuspid valves: Secondary | ICD-10-CM | POA: Diagnosis not present

## 2023-08-26 DIAGNOSIS — Z9181 History of falling: Secondary | ICD-10-CM | POA: Diagnosis not present

## 2023-08-26 DIAGNOSIS — I69151 Hemiplegia and hemiparesis following nontraumatic intracerebral hemorrhage affecting right dominant side: Secondary | ICD-10-CM | POA: Diagnosis not present

## 2023-08-26 DIAGNOSIS — Z992 Dependence on renal dialysis: Secondary | ICD-10-CM | POA: Diagnosis not present

## 2023-08-26 DIAGNOSIS — K219 Gastro-esophageal reflux disease without esophagitis: Secondary | ICD-10-CM | POA: Diagnosis not present

## 2023-08-26 DIAGNOSIS — M109 Gout, unspecified: Secondary | ICD-10-CM | POA: Diagnosis not present

## 2023-08-26 DIAGNOSIS — M349 Systemic sclerosis, unspecified: Secondary | ICD-10-CM | POA: Diagnosis not present

## 2023-08-26 DIAGNOSIS — I69198 Other sequelae of nontraumatic intracerebral hemorrhage: Secondary | ICD-10-CM | POA: Diagnosis not present

## 2023-08-26 DIAGNOSIS — I7300 Raynaud's syndrome without gangrene: Secondary | ICD-10-CM | POA: Diagnosis not present

## 2023-08-26 DIAGNOSIS — I4891 Unspecified atrial fibrillation: Secondary | ICD-10-CM | POA: Diagnosis not present

## 2023-08-26 DIAGNOSIS — R29898 Other symptoms and signs involving the musculoskeletal system: Secondary | ICD-10-CM | POA: Diagnosis not present

## 2023-08-26 DIAGNOSIS — D631 Anemia in chronic kidney disease: Secondary | ICD-10-CM | POA: Diagnosis not present

## 2023-08-26 DIAGNOSIS — N186 End stage renal disease: Secondary | ICD-10-CM | POA: Diagnosis not present

## 2023-08-26 DIAGNOSIS — M069 Rheumatoid arthritis, unspecified: Secondary | ICD-10-CM | POA: Diagnosis not present

## 2023-08-27 DIAGNOSIS — D509 Iron deficiency anemia, unspecified: Secondary | ICD-10-CM | POA: Diagnosis not present

## 2023-08-27 DIAGNOSIS — Z992 Dependence on renal dialysis: Secondary | ICD-10-CM | POA: Diagnosis not present

## 2023-08-27 DIAGNOSIS — N186 End stage renal disease: Secondary | ICD-10-CM | POA: Diagnosis not present

## 2023-08-27 DIAGNOSIS — T7840XA Allergy, unspecified, initial encounter: Secondary | ICD-10-CM | POA: Diagnosis not present

## 2023-08-27 DIAGNOSIS — D631 Anemia in chronic kidney disease: Secondary | ICD-10-CM | POA: Diagnosis not present

## 2023-08-27 DIAGNOSIS — I272 Pulmonary hypertension, unspecified: Secondary | ICD-10-CM | POA: Diagnosis not present

## 2023-08-27 DIAGNOSIS — N2581 Secondary hyperparathyroidism of renal origin: Secondary | ICD-10-CM | POA: Diagnosis not present

## 2023-08-29 DIAGNOSIS — N186 End stage renal disease: Secondary | ICD-10-CM | POA: Diagnosis not present

## 2023-08-29 DIAGNOSIS — Z992 Dependence on renal dialysis: Secondary | ICD-10-CM | POA: Diagnosis not present

## 2023-08-29 DIAGNOSIS — I493 Ventricular premature depolarization: Secondary | ICD-10-CM | POA: Diagnosis not present

## 2023-08-29 DIAGNOSIS — M349 Systemic sclerosis, unspecified: Secondary | ICD-10-CM | POA: Diagnosis not present

## 2023-08-29 DIAGNOSIS — M0579 Rheumatoid arthritis with rheumatoid factor of multiple sites without organ or systems involvement: Secondary | ICD-10-CM | POA: Diagnosis not present

## 2023-08-29 DIAGNOSIS — I4719 Other supraventricular tachycardia: Secondary | ICD-10-CM | POA: Diagnosis not present

## 2023-08-30 DIAGNOSIS — Z992 Dependence on renal dialysis: Secondary | ICD-10-CM | POA: Diagnosis not present

## 2023-08-30 DIAGNOSIS — D509 Iron deficiency anemia, unspecified: Secondary | ICD-10-CM | POA: Diagnosis not present

## 2023-08-30 DIAGNOSIS — D631 Anemia in chronic kidney disease: Secondary | ICD-10-CM | POA: Diagnosis not present

## 2023-08-30 DIAGNOSIS — N186 End stage renal disease: Secondary | ICD-10-CM | POA: Diagnosis not present

## 2023-08-30 DIAGNOSIS — T7840XA Allergy, unspecified, initial encounter: Secondary | ICD-10-CM | POA: Diagnosis not present

## 2023-08-30 DIAGNOSIS — I272 Pulmonary hypertension, unspecified: Secondary | ICD-10-CM | POA: Diagnosis not present

## 2023-08-30 DIAGNOSIS — N2581 Secondary hyperparathyroidism of renal origin: Secondary | ICD-10-CM | POA: Diagnosis not present

## 2023-08-31 ENCOUNTER — Encounter: Admitting: Physical Therapy

## 2023-08-31 ENCOUNTER — Ambulatory Visit: Payer: Medicare Other | Admitting: Neurology

## 2023-08-31 DIAGNOSIS — I081 Rheumatic disorders of both mitral and tricuspid valves: Secondary | ICD-10-CM | POA: Diagnosis not present

## 2023-08-31 DIAGNOSIS — D631 Anemia in chronic kidney disease: Secondary | ICD-10-CM | POA: Diagnosis not present

## 2023-08-31 DIAGNOSIS — Z9981 Dependence on supplemental oxygen: Secondary | ICD-10-CM | POA: Diagnosis not present

## 2023-08-31 DIAGNOSIS — I69959 Hemiplegia and hemiparesis following unspecified cerebrovascular disease affecting unspecified side: Secondary | ICD-10-CM | POA: Diagnosis not present

## 2023-08-31 DIAGNOSIS — Z9181 History of falling: Secondary | ICD-10-CM | POA: Diagnosis not present

## 2023-08-31 DIAGNOSIS — I7300 Raynaud's syndrome without gangrene: Secondary | ICD-10-CM | POA: Diagnosis not present

## 2023-08-31 DIAGNOSIS — Z7189 Other specified counseling: Secondary | ICD-10-CM | POA: Diagnosis not present

## 2023-08-31 DIAGNOSIS — I69151 Hemiplegia and hemiparesis following nontraumatic intracerebral hemorrhage affecting right dominant side: Secondary | ICD-10-CM | POA: Diagnosis not present

## 2023-08-31 DIAGNOSIS — I4891 Unspecified atrial fibrillation: Secondary | ICD-10-CM | POA: Diagnosis not present

## 2023-08-31 DIAGNOSIS — K745 Biliary cirrhosis, unspecified: Secondary | ICD-10-CM | POA: Diagnosis not present

## 2023-08-31 DIAGNOSIS — M109 Gout, unspecified: Secondary | ICD-10-CM | POA: Diagnosis not present

## 2023-08-31 DIAGNOSIS — M069 Rheumatoid arthritis, unspecified: Secondary | ICD-10-CM | POA: Diagnosis not present

## 2023-08-31 DIAGNOSIS — M349 Systemic sclerosis, unspecified: Secondary | ICD-10-CM | POA: Diagnosis not present

## 2023-08-31 DIAGNOSIS — N186 End stage renal disease: Secondary | ICD-10-CM | POA: Diagnosis not present

## 2023-08-31 DIAGNOSIS — Z7982 Long term (current) use of aspirin: Secondary | ICD-10-CM | POA: Diagnosis not present

## 2023-08-31 DIAGNOSIS — K219 Gastro-esophageal reflux disease without esophagitis: Secondary | ICD-10-CM | POA: Diagnosis not present

## 2023-08-31 DIAGNOSIS — Z992 Dependence on renal dialysis: Secondary | ICD-10-CM | POA: Diagnosis not present

## 2023-08-31 DIAGNOSIS — R29898 Other symptoms and signs involving the musculoskeletal system: Secondary | ICD-10-CM | POA: Diagnosis not present

## 2023-08-31 DIAGNOSIS — M797 Fibromyalgia: Secondary | ICD-10-CM | POA: Diagnosis not present

## 2023-08-31 DIAGNOSIS — M0579 Rheumatoid arthritis with rheumatoid factor of multiple sites without organ or systems involvement: Secondary | ICD-10-CM | POA: Diagnosis not present

## 2023-08-31 DIAGNOSIS — E538 Deficiency of other specified B group vitamins: Secondary | ICD-10-CM | POA: Diagnosis not present

## 2023-08-31 DIAGNOSIS — I272 Pulmonary hypertension, unspecified: Secondary | ICD-10-CM | POA: Diagnosis not present

## 2023-08-31 DIAGNOSIS — I69198 Other sequelae of nontraumatic intracerebral hemorrhage: Secondary | ICD-10-CM | POA: Diagnosis not present

## 2023-08-31 DIAGNOSIS — K5900 Constipation, unspecified: Secondary | ICD-10-CM | POA: Diagnosis not present

## 2023-09-01 DIAGNOSIS — D509 Iron deficiency anemia, unspecified: Secondary | ICD-10-CM | POA: Diagnosis not present

## 2023-09-01 DIAGNOSIS — Z992 Dependence on renal dialysis: Secondary | ICD-10-CM | POA: Diagnosis not present

## 2023-09-01 DIAGNOSIS — T7840XA Allergy, unspecified, initial encounter: Secondary | ICD-10-CM | POA: Diagnosis not present

## 2023-09-01 DIAGNOSIS — N2581 Secondary hyperparathyroidism of renal origin: Secondary | ICD-10-CM | POA: Diagnosis not present

## 2023-09-01 DIAGNOSIS — I272 Pulmonary hypertension, unspecified: Secondary | ICD-10-CM | POA: Diagnosis not present

## 2023-09-01 DIAGNOSIS — D631 Anemia in chronic kidney disease: Secondary | ICD-10-CM | POA: Diagnosis not present

## 2023-09-01 DIAGNOSIS — N186 End stage renal disease: Secondary | ICD-10-CM | POA: Diagnosis not present

## 2023-09-03 DIAGNOSIS — D509 Iron deficiency anemia, unspecified: Secondary | ICD-10-CM | POA: Diagnosis not present

## 2023-09-03 DIAGNOSIS — N2581 Secondary hyperparathyroidism of renal origin: Secondary | ICD-10-CM | POA: Diagnosis not present

## 2023-09-03 DIAGNOSIS — I272 Pulmonary hypertension, unspecified: Secondary | ICD-10-CM | POA: Diagnosis not present

## 2023-09-03 DIAGNOSIS — D631 Anemia in chronic kidney disease: Secondary | ICD-10-CM | POA: Diagnosis not present

## 2023-09-03 DIAGNOSIS — N186 End stage renal disease: Secondary | ICD-10-CM | POA: Diagnosis not present

## 2023-09-03 DIAGNOSIS — T7840XA Allergy, unspecified, initial encounter: Secondary | ICD-10-CM | POA: Diagnosis not present

## 2023-09-03 DIAGNOSIS — Z992 Dependence on renal dialysis: Secondary | ICD-10-CM | POA: Diagnosis not present

## 2023-09-05 DIAGNOSIS — R29898 Other symptoms and signs involving the musculoskeletal system: Secondary | ICD-10-CM | POA: Diagnosis not present

## 2023-09-05 DIAGNOSIS — I120 Hypertensive chronic kidney disease with stage 5 chronic kidney disease or end stage renal disease: Secondary | ICD-10-CM | POA: Diagnosis not present

## 2023-09-05 DIAGNOSIS — M797 Fibromyalgia: Secondary | ICD-10-CM | POA: Diagnosis not present

## 2023-09-05 DIAGNOSIS — M349 Systemic sclerosis, unspecified: Secondary | ICD-10-CM | POA: Diagnosis not present

## 2023-09-05 DIAGNOSIS — I272 Pulmonary hypertension, unspecified: Secondary | ICD-10-CM | POA: Diagnosis not present

## 2023-09-05 DIAGNOSIS — K745 Biliary cirrhosis, unspecified: Secondary | ICD-10-CM | POA: Diagnosis not present

## 2023-09-05 DIAGNOSIS — N186 End stage renal disease: Secondary | ICD-10-CM | POA: Diagnosis not present

## 2023-09-05 DIAGNOSIS — K219 Gastro-esophageal reflux disease without esophagitis: Secondary | ICD-10-CM | POA: Diagnosis not present

## 2023-09-05 DIAGNOSIS — E538 Deficiency of other specified B group vitamins: Secondary | ICD-10-CM | POA: Diagnosis not present

## 2023-09-05 DIAGNOSIS — Z7982 Long term (current) use of aspirin: Secondary | ICD-10-CM | POA: Diagnosis not present

## 2023-09-05 DIAGNOSIS — I4891 Unspecified atrial fibrillation: Secondary | ICD-10-CM | POA: Diagnosis not present

## 2023-09-05 DIAGNOSIS — I69198 Other sequelae of nontraumatic intracerebral hemorrhage: Secondary | ICD-10-CM | POA: Diagnosis not present

## 2023-09-05 DIAGNOSIS — Z9981 Dependence on supplemental oxygen: Secondary | ICD-10-CM | POA: Diagnosis not present

## 2023-09-05 DIAGNOSIS — Z9181 History of falling: Secondary | ICD-10-CM | POA: Diagnosis not present

## 2023-09-05 DIAGNOSIS — D631 Anemia in chronic kidney disease: Secondary | ICD-10-CM | POA: Diagnosis not present

## 2023-09-05 DIAGNOSIS — I081 Rheumatic disorders of both mitral and tricuspid valves: Secondary | ICD-10-CM | POA: Diagnosis not present

## 2023-09-05 DIAGNOSIS — Z992 Dependence on renal dialysis: Secondary | ICD-10-CM | POA: Diagnosis not present

## 2023-09-05 DIAGNOSIS — I7300 Raynaud's syndrome without gangrene: Secondary | ICD-10-CM | POA: Diagnosis not present

## 2023-09-05 DIAGNOSIS — M069 Rheumatoid arthritis, unspecified: Secondary | ICD-10-CM | POA: Diagnosis not present

## 2023-09-05 DIAGNOSIS — I69151 Hemiplegia and hemiparesis following nontraumatic intracerebral hemorrhage affecting right dominant side: Secondary | ICD-10-CM | POA: Diagnosis not present

## 2023-09-06 DIAGNOSIS — N186 End stage renal disease: Secondary | ICD-10-CM | POA: Diagnosis not present

## 2023-09-06 DIAGNOSIS — D509 Iron deficiency anemia, unspecified: Secondary | ICD-10-CM | POA: Diagnosis not present

## 2023-09-06 DIAGNOSIS — D631 Anemia in chronic kidney disease: Secondary | ICD-10-CM | POA: Diagnosis not present

## 2023-09-06 DIAGNOSIS — Z992 Dependence on renal dialysis: Secondary | ICD-10-CM | POA: Diagnosis not present

## 2023-09-06 DIAGNOSIS — I272 Pulmonary hypertension, unspecified: Secondary | ICD-10-CM | POA: Diagnosis not present

## 2023-09-06 DIAGNOSIS — T7840XA Allergy, unspecified, initial encounter: Secondary | ICD-10-CM | POA: Diagnosis not present

## 2023-09-06 DIAGNOSIS — N2581 Secondary hyperparathyroidism of renal origin: Secondary | ICD-10-CM | POA: Diagnosis not present

## 2023-09-07 ENCOUNTER — Ambulatory Visit: Attending: Obstetrics and Gynecology | Admitting: Physical Therapy

## 2023-09-07 ENCOUNTER — Telehealth: Payer: Self-pay | Admitting: Physical Therapy

## 2023-09-07 DIAGNOSIS — R279 Unspecified lack of coordination: Secondary | ICD-10-CM | POA: Insufficient documentation

## 2023-09-07 DIAGNOSIS — R293 Abnormal posture: Secondary | ICD-10-CM | POA: Insufficient documentation

## 2023-09-07 DIAGNOSIS — M6281 Muscle weakness (generalized): Secondary | ICD-10-CM | POA: Insufficient documentation

## 2023-09-07 NOTE — Telephone Encounter (Signed)
 Called patient to inform her of missed appt 8/27 at 11:45 AM, offered 3:30 pm appt on same day and pt cannot make it. Rescheduled 8/29 at 8:45 AM with Channing Celena Domino, PT, DPT 09/07/23 1:26 PM

## 2023-09-08 DIAGNOSIS — N2581 Secondary hyperparathyroidism of renal origin: Secondary | ICD-10-CM | POA: Diagnosis not present

## 2023-09-08 DIAGNOSIS — D509 Iron deficiency anemia, unspecified: Secondary | ICD-10-CM | POA: Diagnosis not present

## 2023-09-08 DIAGNOSIS — D631 Anemia in chronic kidney disease: Secondary | ICD-10-CM | POA: Diagnosis not present

## 2023-09-08 DIAGNOSIS — I272 Pulmonary hypertension, unspecified: Secondary | ICD-10-CM | POA: Diagnosis not present

## 2023-09-08 DIAGNOSIS — T7840XA Allergy, unspecified, initial encounter: Secondary | ICD-10-CM | POA: Diagnosis not present

## 2023-09-08 DIAGNOSIS — Z992 Dependence on renal dialysis: Secondary | ICD-10-CM | POA: Diagnosis not present

## 2023-09-08 DIAGNOSIS — N186 End stage renal disease: Secondary | ICD-10-CM | POA: Diagnosis not present

## 2023-09-09 ENCOUNTER — Telehealth: Payer: Self-pay | Admitting: Physical Therapy

## 2023-09-09 ENCOUNTER — Ambulatory Visit: Admitting: Physical Therapy

## 2023-09-09 NOTE — Telephone Encounter (Signed)
 Called patient about her missed appointment today. Left a message.  Channing Pereyra, PT @8 /29/25@ 9:03 AM

## 2023-09-10 DIAGNOSIS — D509 Iron deficiency anemia, unspecified: Secondary | ICD-10-CM | POA: Diagnosis not present

## 2023-09-10 DIAGNOSIS — N2581 Secondary hyperparathyroidism of renal origin: Secondary | ICD-10-CM | POA: Diagnosis not present

## 2023-09-10 DIAGNOSIS — D631 Anemia in chronic kidney disease: Secondary | ICD-10-CM | POA: Diagnosis not present

## 2023-09-10 DIAGNOSIS — T7840XA Allergy, unspecified, initial encounter: Secondary | ICD-10-CM | POA: Diagnosis not present

## 2023-09-10 DIAGNOSIS — I272 Pulmonary hypertension, unspecified: Secondary | ICD-10-CM | POA: Diagnosis not present

## 2023-09-10 DIAGNOSIS — Z992 Dependence on renal dialysis: Secondary | ICD-10-CM | POA: Diagnosis not present

## 2023-09-10 DIAGNOSIS — N186 End stage renal disease: Secondary | ICD-10-CM | POA: Diagnosis not present

## 2023-09-11 DIAGNOSIS — I50812 Chronic right heart failure: Secondary | ICD-10-CM | POA: Diagnosis not present

## 2023-09-11 DIAGNOSIS — N269 Renal sclerosis, unspecified: Secondary | ICD-10-CM | POA: Diagnosis not present

## 2023-09-11 DIAGNOSIS — Z992 Dependence on renal dialysis: Secondary | ICD-10-CM | POA: Diagnosis not present

## 2023-09-11 DIAGNOSIS — E782 Mixed hyperlipidemia: Secondary | ICD-10-CM | POA: Diagnosis not present

## 2023-09-11 DIAGNOSIS — N186 End stage renal disease: Secondary | ICD-10-CM | POA: Diagnosis not present

## 2023-09-11 DIAGNOSIS — I10 Essential (primary) hypertension: Secondary | ICD-10-CM | POA: Diagnosis not present

## 2023-09-13 DIAGNOSIS — D631 Anemia in chronic kidney disease: Secondary | ICD-10-CM | POA: Diagnosis not present

## 2023-09-13 DIAGNOSIS — D509 Iron deficiency anemia, unspecified: Secondary | ICD-10-CM | POA: Diagnosis not present

## 2023-09-13 DIAGNOSIS — T7840XA Allergy, unspecified, initial encounter: Secondary | ICD-10-CM | POA: Diagnosis not present

## 2023-09-13 DIAGNOSIS — N186 End stage renal disease: Secondary | ICD-10-CM | POA: Diagnosis not present

## 2023-09-13 DIAGNOSIS — R519 Headache, unspecified: Secondary | ICD-10-CM | POA: Diagnosis not present

## 2023-09-13 DIAGNOSIS — N2581 Secondary hyperparathyroidism of renal origin: Secondary | ICD-10-CM | POA: Diagnosis not present

## 2023-09-13 DIAGNOSIS — Z992 Dependence on renal dialysis: Secondary | ICD-10-CM | POA: Diagnosis not present

## 2023-09-13 DIAGNOSIS — I272 Pulmonary hypertension, unspecified: Secondary | ICD-10-CM | POA: Diagnosis not present

## 2023-09-14 ENCOUNTER — Encounter: Payer: Self-pay | Admitting: Physical Therapy

## 2023-09-14 ENCOUNTER — Ambulatory Visit: Attending: Obstetrics and Gynecology | Admitting: Physical Therapy

## 2023-09-14 DIAGNOSIS — Z7982 Long term (current) use of aspirin: Secondary | ICD-10-CM | POA: Diagnosis not present

## 2023-09-14 DIAGNOSIS — M109 Gout, unspecified: Secondary | ICD-10-CM | POA: Diagnosis not present

## 2023-09-14 DIAGNOSIS — K219 Gastro-esophageal reflux disease without esophagitis: Secondary | ICD-10-CM | POA: Diagnosis not present

## 2023-09-14 DIAGNOSIS — I69198 Other sequelae of nontraumatic intracerebral hemorrhage: Secondary | ICD-10-CM | POA: Diagnosis not present

## 2023-09-14 DIAGNOSIS — M069 Rheumatoid arthritis, unspecified: Secondary | ICD-10-CM | POA: Diagnosis not present

## 2023-09-14 DIAGNOSIS — K745 Biliary cirrhosis, unspecified: Secondary | ICD-10-CM | POA: Diagnosis not present

## 2023-09-14 DIAGNOSIS — I7300 Raynaud's syndrome without gangrene: Secondary | ICD-10-CM | POA: Diagnosis not present

## 2023-09-14 DIAGNOSIS — I081 Rheumatic disorders of both mitral and tricuspid valves: Secondary | ICD-10-CM | POA: Diagnosis not present

## 2023-09-14 DIAGNOSIS — E538 Deficiency of other specified B group vitamins: Secondary | ICD-10-CM | POA: Diagnosis not present

## 2023-09-14 DIAGNOSIS — I272 Pulmonary hypertension, unspecified: Secondary | ICD-10-CM | POA: Diagnosis not present

## 2023-09-14 DIAGNOSIS — I4891 Unspecified atrial fibrillation: Secondary | ICD-10-CM | POA: Diagnosis not present

## 2023-09-14 DIAGNOSIS — D631 Anemia in chronic kidney disease: Secondary | ICD-10-CM | POA: Diagnosis not present

## 2023-09-14 DIAGNOSIS — Z992 Dependence on renal dialysis: Secondary | ICD-10-CM | POA: Diagnosis not present

## 2023-09-14 DIAGNOSIS — M797 Fibromyalgia: Secondary | ICD-10-CM | POA: Diagnosis not present

## 2023-09-14 DIAGNOSIS — I69151 Hemiplegia and hemiparesis following nontraumatic intracerebral hemorrhage affecting right dominant side: Secondary | ICD-10-CM | POA: Diagnosis not present

## 2023-09-14 DIAGNOSIS — I120 Hypertensive chronic kidney disease with stage 5 chronic kidney disease or end stage renal disease: Secondary | ICD-10-CM | POA: Diagnosis not present

## 2023-09-14 DIAGNOSIS — Z9981 Dependence on supplemental oxygen: Secondary | ICD-10-CM | POA: Diagnosis not present

## 2023-09-14 DIAGNOSIS — Z9181 History of falling: Secondary | ICD-10-CM | POA: Diagnosis not present

## 2023-09-14 DIAGNOSIS — N186 End stage renal disease: Secondary | ICD-10-CM | POA: Diagnosis not present

## 2023-09-14 DIAGNOSIS — R29898 Other symptoms and signs involving the musculoskeletal system: Secondary | ICD-10-CM | POA: Diagnosis not present

## 2023-09-14 DIAGNOSIS — M349 Systemic sclerosis, unspecified: Secondary | ICD-10-CM | POA: Diagnosis not present

## 2023-09-14 NOTE — Progress Notes (Signed)
 Initial neurology clinic note  Reason for Evaluation: Consultation requested by Claudene Pellet, MD for an opinion regarding neuropathy. My final recommendations will be communicated back to the requesting physician by way of shared medical record or letter to requesting physician via [place]  mail.  HPI: This is [name], a 68 y.o. right-handed female with a medical history of stroke (2021 left frontal IPH), HTN, HLD, ESRD on dialysis, cirrhosis, RA, scleroderma, fibromyalgia, gout, mitral valve prolapse, GERD, anxiety, vit D deficiency, pulm HTN who presents to neurology clinic with the chief complaint of numbness and tingling. The patient is accompanied by husband.  Patient has numbness and tingling in feet and calves in bilateral lower limbs for at least 6 months to 1 year. She is in a wheelchair today but normally walks with a rollator. She mentions this is because she has been walking slowly since her hemorrhagic stroke in 2021. This was prior to the onset of numbness and tingling. She denies any recent falls. She endorses neck pain.  Patient was started on gabapentin  but does not like the way it makes her feel. She may take one after dialysis and one at night (100 mg each). She thinks this takes the edge off. Podiatry discussed Lyrica but patient refused.  She had an EMG by Dr. Letha on 03/10/22 that per notes showed sensorimotor neuropathy on the basis of absent bilateral sural sensory responses and chronic neurogenic changes in EHL, AH, and EDB from what I can tell of handwritten report.  Patient is here today because she is concerned that symptoms are worse. She wonders if there is something else she needs to do.  She is doing home PT currently. She does a recumbent bike as well.  Patient was previously being seen by Dr. Rosemarie since ICH due to Warfarin on 12/12/2019. She is currently on asa 81 mg daily.  She does not report any constitutional symptoms like fever, night sweats,  anorexia or unintentional weight loss.  EtOH use: None  Restrictive diet? No Family history of neuropathy/myopathy/neurologic disease? Mother with brain aneurysm    MEDICATIONS:  Outpatient Encounter Medications as of 09/28/2023  Medication Sig Note   acetaminophen  (TYLENOL ) 325 MG tablet Take 650 mg by mouth every 6 (six) hours as needed for mild pain (pain score 1-3), moderate pain (pain score 4-6) or headache.    albuterol  (VENTOLIN  HFA) 108 (90 Base) MCG/ACT inhaler INHALE 2 PUFFS INTO THE LUNGS EVERY 6 HOURS AS NEEDED FOR WHEEZING OR SHORTNESS OF BREATH    ambrisentan  (LETAIRIS ) 10 MG tablet Take 10 mg by mouth daily.    aspirin  EC 81 MG tablet Take 1 tablet (81 mg total) by mouth daily. Swallow whole.    atorvastatin  (LIPITOR ) 80 MG tablet Take 1 tablet (80 mg total) by mouth daily.    AURYXIA  1 GM 210 MG(Fe) tablet 8 tablets.    azelastine  (ASTELIN ) 0.1 % nasal spray Place 1 spray into both nostrils 2 (two) times daily as needed for rhinitis. Use in each nostril as directed    bisacodyl  (DULCOLAX) 5 MG EC tablet Take 1 tablet (5 mg total) by mouth daily as needed for severe constipation.    camphor-menthol  (SARNA) lotion Apply topically 2 (two) times daily.    cinacalcet  (SENSIPAR ) 60 MG tablet Take 60 mg by mouth Every Tuesday,Thursday,and Saturday with dialysis.    clonazePAM  (KLONOPIN ) 0.5 MG tablet Take 1 tablet (0.5 mg total) by mouth 2 (two) times daily as needed for anxiety.  COMIRNATY syringe     cyclobenzaprine  (FLEXERIL ) 5 MG tablet Take 1 tablet (5 mg total) by mouth every 8 (eight) hours as needed for muscle spasms.    docusate sodium  (COLACE) 100 MG capsule Take 2 capsules (200 mg total) by mouth daily.    famotidine  (PEPCID ) 20 MG tablet Take 1 tablet (20 mg total) by mouth daily as needed for heartburn or indigestion.    fluticasone  (FLONASE ) 50 MCG/ACT nasal spray Place 1 spray into both nostrils daily for 7 days.    folic acid  (FOLVITE ) 1 MG tablet Take 1 tablet (1  mg total) by mouth daily.    hydrOXYzine  (ATARAX /VISTARIL ) 25 MG tablet Take 25 mg by mouth every 8 (eight) hours as needed for itching.     hyoscyamine  (ANASPAZ ) 0.125 MG TBDP disintergrating tablet Place 0.125 mg under the tongue every 4 (four) hours as needed for cramping.    lanthanum (FOSRENOL) 1000 MG chewable tablet Chew 1,000-2,000 mg by mouth 3 (three) times daily with meals. Snack 1000 mg    levocetirizine (XYZAL ) 5 MG tablet Take 5 mg by mouth every evening. 01/31/2023: OTC   linaclotide (LINZESS) 72 MCG capsule Take 72 mcg by mouth.    Methoxy PEG-Epoetin  Beta (MIRCERA IJ) Mircera    multivitamin (RENA-VIT) TABS tablet Take 1 tablet by mouth daily at 6 (six) AM.    pilocarpine (SALAGEN) 5 MG tablet Take 5 mg by mouth.    polyethylene glycol (MIRALAX  / GLYCOLAX ) 17 g packet Take 17 g by mouth 2 (two) times daily.    Selexipag  (UPTRAVI ) 800 MCG TABS Take 1 tablet (800 mcg total) by mouth in the morning and at bedtime.    Tiotropium Bromide-Olodaterol (STIOLTO RESPIMAT ) 2.5-2.5 MCG/ACT AERS Inhale 2 puffs into the lungs daily.    tiZANidine (ZANAFLEX) 4 MG tablet Take 4 mg by mouth.    WINREVAIR subcutaneous injection Inject into the skin every 21 ( twenty-one) days.    Cyanocobalamin  (B-12 PO) Take 100 mcg by mouth daily.    topiramate  (TOPAMAX ) 25 MG tablet Take 1 tablet (25 mg total) by mouth 2 (two) times daily. (Patient not taking: Reported on 09/28/2023)    No facility-administered encounter medications on file as of 09/28/2023.    PAST MEDICAL HISTORY: Past Medical History:  Diagnosis Date   Achalasia    Anxiety    Dysplasia of cervix, low grade (CIN 1)    Environmental allergies    all year long (12/27/2016)   ESRD (end stage renal disease) on dialysis (HCC)    TTS; [facility] (12/27/2016)   Fibromyalgia    GERD (gastroesophageal reflux disease)    Gout    Hypertension    IBS (irritable bowel syndrome)    MVP (mitral valve prolapse)    RA (rheumatoid arthritis)  (HCC)    FOLLOWED BY DR. SHANAHAN   Raynaud's disease    Scleroderma (HCC)    Seasonal allergies    Thrombocytopenia (HCC) 07/01/2016   Acute fall to 13,000 07/01/16   Tubular adenoma 01/08/2008   CECUM   Vitamin D  deficiency     PAST SURGICAL HISTORY: Past Surgical History:  Procedure Laterality Date   A/V FISTULAGRAM N/A 02/02/2023   Procedure: A/V Fistulagram;  Surgeon: Norine Manuelita LABOR, MD;  Location: [facility];  Service: Cardiovascular;  Laterality: N/A;   ANKLE FRACTURE SURGERY Right    AV FISTULA PLACEMENT Left 06/28/2016   Procedure: left arm ARTERIOVENOUS (AV) FISTULA CREATION;  Surgeon: Oris Krystal FALCON, MD;  Location:  [facility] OR;  Service: Vascular;  Laterality: Left;   BASCILIC VEIN TRANSPOSITION Left 09/27/2016   Procedure: LEFT UPPER ARM CEPHALIC VEIN TRANSPOSITION;  Surgeon: Oris Krystal FALCON, MD;  Location: [facility] OR;  Service: Vascular;  Laterality: Left;   BREAST BIOPSY     ? side   CESAREAN SECTION  1994   CO2 LASER OF CERVIX     COLONOSCOPY W/ BIOPSIES  01/08/2008   INSERTION OF DIALYSIS CATHETER Right 06/28/2016   Procedure: INSERTION OF DIALYSIS CATHETER, right internal jugular;  Surgeon: Oris Krystal FALCON, MD;  Location: [facility] OR;  Service: Vascular;  Laterality: Right;   MYOMECTOMY     NASAL ENDOSCOPY WITH EPISTAXIS CONTROL N/A 12/29/2019   Procedure: NASAL ENDOSCOPY WITH EPISTAXIS CONTROL;  Surgeon: Karis Clunes, MD;  Location: [facility] OR;  Service: ENT;  Laterality: N/A;   PELVIC LAPAROSCOPY  2011   superficial thrombophlebitis Left 07-2014    ALLERGIES: Allergies  Allergen Reactions   Other Anaphylaxis and Other (See Comments)    Do not use polyflux membrane.  Use alternate heart racing   Iron  Other (See Comments)    Other Reaction(s): Unknown    Due to liver disease   Nitrofuran Derivatives     Advised not to take   Savella [Milnacipran Hcl] Palpitations and Other (See Comments)    Unknown   Tape Rash and Other (See Comments)    Itch- unsure if it was paper or  adhesive tape    FAMILY HISTORY: Family History  Problem Relation Age of Onset   Cerebral aneurysm Mother    Hypertension Mother    Diabetes Mother    Heart disease Father    Skin telangiectasia Maternal Aunt        possible telangiectasia, patient recalls reddish birthmarks   Heart disease Maternal Aunt    Diabetes Maternal Grandmother    Heart disease Paternal Grandfather    Cerebral palsy Cousin        1ST COUSIN?   Diabetes Paternal Grandmother    Heart attack Maternal Grandfather    Colon cancer Maternal Uncle        older than 68 yo at diagnosis   Cerebral palsy Other    Stomach cancer Paternal Uncle     SOCIAL HISTORY: Social History   Tobacco Use   Smoking status: Never   Smokeless tobacco: Never  Vaping Use   Vaping status: Never Used  Substance Use Topics   Alcohol use: No   Drug use: No   Social History   Social History Narrative   Lives in [place]   Are you right handed or left handed? right   Are you currently employed ?    What is your current occupation? retired   Do you live at home alone?   Who lives with you? husband   What type of home do you live in: 1 story or 2 story? two    Caffeine 1 cup coffee     OBJECTIVE: PHYSICAL EXAM: BP 116/65   Pulse 67   Ht 5' 5 (1.651 m)   Wt 128 lb (58.1 kg)   LMP 11/15/2008   SpO2 92%   BMI 21.30 kg/m   General: General appearance: Awake and alert. No distress. Cooperative with exam.  Skin: No obvious rash or jaundice. HEENT: Atraumatic. Anicteric. Lungs: Non-labored breathing on room air  Heart: Regular Extremities: Bilateral lower extremity edema Psych: Affect appropriate.  Neurological: Mental Status: Alert. Speech fluent. No pseudobulbar affect Cranial Nerves: CNII: No RAPD. Visual  fields grossly intact. CNIII, IV, VI: PERRL. No nystagmus. EOMI. CN V: Facial sensation intact bilaterally to fine touch. CN VII: Facial muscles symmetric and strong. No ptosis at rest. CN VIII:  Hearing grossly intact bilaterally. CN IX: No hypophonia. CN X: Palate elevates symmetrically. CN XI: Full strength shoulder shrug bilaterally. CN XII: Tongue protrusion full and midline. No atrophy or fasciculations. No significant dysarthria Motor: Tone is normal.  Individual muscle group testing (MRC grade out of 5):  Movement     Neck flexion 5    Neck extension 5     Right Left   Shoulder abduction 4+ 5   Elbow flexion 5- 5   Elbow extension 4+ 5   Finger abduction - FDI 5 5   Finger abduction - ADM 5 5   Finger extension 5 5   Finger distal flexion - 2/3 5 5    Finger distal flexion - 4/5 5 5    Thumb flexion - FPL 5 5   Thumb abduction - APB 5 5    Hip flexion 5- 5   Hip extension 5- 5   Hip adduction 5- 5   Hip abduction 5- 5   Knee extension 5 5   Knee flexion 4+ 5   Dorsiflexion 5 5   Plantarflexion 5 5    Reflexes:  Right Left   Bicep 2+ 2+   Tricep 2+ 2+   BrRad 2+ 2+   Knee 2+ 2+   Ankle 0 0    Pathological Reflexes: Babinski: flexor response bilaterally Hoffman: absent bilaterally Troemner: absent bilaterally Sensation: Pinprick: Intact in all extremities Vibration: Intact in all extremities Proprioception: Intact in bilateral great toes Coordination: Intact finger-to- nose-finger bilaterally. Romberg negative. Gait: Very narrow based gait, almost crossing over feet (Scissor gait). Unsteady.  Lab and Test Review: Internal labs: 06/29/23: Lipid panel: tChol 156, LDL 57, TG 91 CBC w/ diff significant for Hb 9.2, MCV 90.5 CMP significant for BUN 34, Cr 7.45 B12: 621 Folate wnl  10/15/22: B12: 501 MMA elevated to 951  04/28/22: HbA1c: 5.4 Neuropathy panel: B12 441, No M spike, TSH wnl, Vit D low at 12.2  Imaging/Procedures: MRI/MRA brain wo contrast (12/13/19): FINDINGS: MRI HEAD FINDINGS   Brain: T1 and T2 hyperintense hematoma in the high subcortical posterior left frontal lobe which measures 3 x 1.8 cm on axial slices. Subcentimeter  adjacent hemorrhage is present within a single rim of vasogenic edema. No underlying infarct is seen. No gross masslike finding detected. There are other remote microhemorrhages with nodular hemosiderin staining seen in the parasagittal right frontal lobe, bilateral anterior frontal white matter, and along the high and lateral left frontal lobe. No generalized chronic white matter disease. No hydrocephalus or extra-axial collection.   Vascular: Arterial findings below. Normal dural venous sinus flow voids.   Skull and upper cervical spine: Hypointense cervical marrow with mild clivus heterogeneity, likely red marrow reconversion in this dialysis patient.   Sinuses/Orbits: Proteinaceous retention cysts in the right maxillary sinus.   Other: Nodular appearance of the bilateral parotid glands with a 9 mm T1 hyperintense mass in the superficial left parotid. Small T2 hyperintense cysts in the right parotid. There is known history of scleroderma.   MRA HEAD FINDINGS   Vessels are smooth and widely patent.  No aneurysm or stenosis.   IMPRESSION: Brain MRI: Known acute hemorrhage in the subcortical left frontal white matter with rim of edema. A few remote hemorrhages have also occurred in the bilateral upper cerebrum suggesting underlying  vasculopathy or bleeding diathesis. Notable lack of chronic white matter disease.   Intracranial MRA:   Negative.  CT head wo contrast (03/25/20): CT head without contrast demonstrating: - Resolving left frontal intracerebral hemorrhage, with mild residual hypodensity  EMG by Dr. Letha on 03/10/22  Absent bilateral sural sensory responses and chronic neurogenic changes in EHL, AH, and EDB from what I can tell of handwritten report.  Right ankle xray (07/18/23): Three-view radiographs of the right ankle shows a congruent mortise with  out any lucency around the hardware no hardware failure.  There is  calcifications within the soft tissue.    ASSESSMENT: Katie Nunez is a 68 y.o. female who presents for evaluation of numbness and tingling in her bilateral lower limbs. She has a relevant medical history of stroke (2021 left frontal IPH due to Warfarin), HTN, HLD, ESRD on dialysis, cirrhosis, RA, scleroderma, fibromyalgia, gout, mitral valve prolapse, GERD, anxiety, vit D deficiency, pulm HTN. Her neurological examination is pertinent for mild right sided weakness (stroke residuals) and very narrow, unsteady gait. Available diagnostic data is significant for EMG in 02/2022 that was interpreted as polyneuropathy. It is also possible that EMG was complicated by edema causing technical difficulties. There is not clear neuropathy on exam, but she does have metabolic risk factors, particularly ESRD on dialysis. Her right sided weakness is due to old stroke. The gait could also be due to old stroke, but given reports of neck pain, cervical stenosis/myelopathy would be important to rule out. Central lesions like stroke and myelopathy may also make peripheral nerve issues. I do not think repeating EMG would be helpful or easy given her edema.  PLAN: -Blood work: MMA, B1, B6 -MRI cervical spine wo contrast -Continue gabapentin  as needed -Lidocaine  cream PRN -Alpha lipoic acid 600 mg once or twice daily -Continue home PT  -Return to clinic in 6 months  The impression above as well as the plan as outlined below were extensively discussed with the patient (in the company of husband) who voiced understanding. All questions were answered to their satisfaction.  The patient was counseled on pertinent fall precautions per the printed material provided today, and as noted under the Patient Instructions section below.  When available, results of the above investigations and possible further recommendations will be communicated to the patient via telephone/MyChart. Patient to call office if not contacted after expected testing turnaround  time.   Total time spent reviewing records, interview, history/exam, documentation, and coordination of care on day of encounter:  55 min   Thank you for allowing me to participate in patient's care.  If I can answer any additional questions, I would be pleased to do so.  Venetia Potters, MD   CC: Claudene Pellet, MD [address] [place] [address]  CC: Referring provider: Claudene Pellet, MD [phone] [address] [place],  [place] [address]

## 2023-09-15 ENCOUNTER — Other Ambulatory Visit: Payer: Self-pay | Admitting: *Deleted

## 2023-09-15 ENCOUNTER — Encounter: Payer: Self-pay | Admitting: Hematology and Oncology

## 2023-09-15 DIAGNOSIS — T7840XA Allergy, unspecified, initial encounter: Secondary | ICD-10-CM | POA: Diagnosis not present

## 2023-09-15 DIAGNOSIS — I272 Pulmonary hypertension, unspecified: Secondary | ICD-10-CM | POA: Diagnosis not present

## 2023-09-15 DIAGNOSIS — R519 Headache, unspecified: Secondary | ICD-10-CM | POA: Diagnosis not present

## 2023-09-15 DIAGNOSIS — N186 End stage renal disease: Secondary | ICD-10-CM | POA: Diagnosis not present

## 2023-09-15 DIAGNOSIS — D509 Iron deficiency anemia, unspecified: Secondary | ICD-10-CM | POA: Diagnosis not present

## 2023-09-15 DIAGNOSIS — D649 Anemia, unspecified: Secondary | ICD-10-CM

## 2023-09-15 DIAGNOSIS — D631 Anemia in chronic kidney disease: Secondary | ICD-10-CM | POA: Diagnosis not present

## 2023-09-15 DIAGNOSIS — Z992 Dependence on renal dialysis: Secondary | ICD-10-CM | POA: Diagnosis not present

## 2023-09-15 DIAGNOSIS — N2581 Secondary hyperparathyroidism of renal origin: Secondary | ICD-10-CM | POA: Diagnosis not present

## 2023-09-15 DIAGNOSIS — D693 Immune thrombocytopenic purpura: Secondary | ICD-10-CM

## 2023-09-16 DIAGNOSIS — K7460 Unspecified cirrhosis of liver: Secondary | ICD-10-CM | POA: Diagnosis not present

## 2023-09-16 DIAGNOSIS — K635 Polyp of colon: Secondary | ICD-10-CM | POA: Diagnosis not present

## 2023-09-16 DIAGNOSIS — R140 Abdominal distension (gaseous): Secondary | ICD-10-CM | POA: Diagnosis not present

## 2023-09-17 DIAGNOSIS — I272 Pulmonary hypertension, unspecified: Secondary | ICD-10-CM | POA: Diagnosis not present

## 2023-09-17 DIAGNOSIS — N186 End stage renal disease: Secondary | ICD-10-CM | POA: Diagnosis not present

## 2023-09-17 DIAGNOSIS — T7840XA Allergy, unspecified, initial encounter: Secondary | ICD-10-CM | POA: Diagnosis not present

## 2023-09-17 DIAGNOSIS — R519 Headache, unspecified: Secondary | ICD-10-CM | POA: Diagnosis not present

## 2023-09-17 DIAGNOSIS — D509 Iron deficiency anemia, unspecified: Secondary | ICD-10-CM | POA: Diagnosis not present

## 2023-09-17 DIAGNOSIS — D631 Anemia in chronic kidney disease: Secondary | ICD-10-CM | POA: Diagnosis not present

## 2023-09-17 DIAGNOSIS — Z992 Dependence on renal dialysis: Secondary | ICD-10-CM | POA: Diagnosis not present

## 2023-09-17 DIAGNOSIS — N2581 Secondary hyperparathyroidism of renal origin: Secondary | ICD-10-CM | POA: Diagnosis not present

## 2023-09-19 DIAGNOSIS — R29898 Other symptoms and signs involving the musculoskeletal system: Secondary | ICD-10-CM | POA: Diagnosis not present

## 2023-09-19 DIAGNOSIS — Z992 Dependence on renal dialysis: Secondary | ICD-10-CM | POA: Diagnosis not present

## 2023-09-19 DIAGNOSIS — Z9181 History of falling: Secondary | ICD-10-CM | POA: Diagnosis not present

## 2023-09-19 DIAGNOSIS — N186 End stage renal disease: Secondary | ICD-10-CM | POA: Diagnosis not present

## 2023-09-19 DIAGNOSIS — K219 Gastro-esophageal reflux disease without esophagitis: Secondary | ICD-10-CM | POA: Diagnosis not present

## 2023-09-19 DIAGNOSIS — Z9981 Dependence on supplemental oxygen: Secondary | ICD-10-CM | POA: Diagnosis not present

## 2023-09-19 DIAGNOSIS — I69151 Hemiplegia and hemiparesis following nontraumatic intracerebral hemorrhage affecting right dominant side: Secondary | ICD-10-CM | POA: Diagnosis not present

## 2023-09-19 DIAGNOSIS — M109 Gout, unspecified: Secondary | ICD-10-CM | POA: Diagnosis not present

## 2023-09-19 DIAGNOSIS — Z7982 Long term (current) use of aspirin: Secondary | ICD-10-CM | POA: Diagnosis not present

## 2023-09-19 DIAGNOSIS — I120 Hypertensive chronic kidney disease with stage 5 chronic kidney disease or end stage renal disease: Secondary | ICD-10-CM | POA: Diagnosis not present

## 2023-09-19 DIAGNOSIS — I69198 Other sequelae of nontraumatic intracerebral hemorrhage: Secondary | ICD-10-CM | POA: Diagnosis not present

## 2023-09-19 DIAGNOSIS — M797 Fibromyalgia: Secondary | ICD-10-CM | POA: Diagnosis not present

## 2023-09-19 DIAGNOSIS — M069 Rheumatoid arthritis, unspecified: Secondary | ICD-10-CM | POA: Diagnosis not present

## 2023-09-19 DIAGNOSIS — I7300 Raynaud's syndrome without gangrene: Secondary | ICD-10-CM | POA: Diagnosis not present

## 2023-09-19 DIAGNOSIS — I081 Rheumatic disorders of both mitral and tricuspid valves: Secondary | ICD-10-CM | POA: Diagnosis not present

## 2023-09-19 DIAGNOSIS — I272 Pulmonary hypertension, unspecified: Secondary | ICD-10-CM | POA: Diagnosis not present

## 2023-09-19 DIAGNOSIS — M349 Systemic sclerosis, unspecified: Secondary | ICD-10-CM | POA: Diagnosis not present

## 2023-09-19 DIAGNOSIS — K745 Biliary cirrhosis, unspecified: Secondary | ICD-10-CM | POA: Diagnosis not present

## 2023-09-19 DIAGNOSIS — D631 Anemia in chronic kidney disease: Secondary | ICD-10-CM | POA: Diagnosis not present

## 2023-09-19 DIAGNOSIS — E538 Deficiency of other specified B group vitamins: Secondary | ICD-10-CM | POA: Diagnosis not present

## 2023-09-19 DIAGNOSIS — I4891 Unspecified atrial fibrillation: Secondary | ICD-10-CM | POA: Diagnosis not present

## 2023-09-20 DIAGNOSIS — D509 Iron deficiency anemia, unspecified: Secondary | ICD-10-CM | POA: Diagnosis not present

## 2023-09-20 DIAGNOSIS — I272 Pulmonary hypertension, unspecified: Secondary | ICD-10-CM | POA: Diagnosis not present

## 2023-09-20 DIAGNOSIS — N2581 Secondary hyperparathyroidism of renal origin: Secondary | ICD-10-CM | POA: Diagnosis not present

## 2023-09-20 DIAGNOSIS — T7840XA Allergy, unspecified, initial encounter: Secondary | ICD-10-CM | POA: Diagnosis not present

## 2023-09-20 DIAGNOSIS — N186 End stage renal disease: Secondary | ICD-10-CM | POA: Diagnosis not present

## 2023-09-20 DIAGNOSIS — R519 Headache, unspecified: Secondary | ICD-10-CM | POA: Diagnosis not present

## 2023-09-20 DIAGNOSIS — Z992 Dependence on renal dialysis: Secondary | ICD-10-CM | POA: Diagnosis not present

## 2023-09-20 DIAGNOSIS — D631 Anemia in chronic kidney disease: Secondary | ICD-10-CM | POA: Diagnosis not present

## 2023-09-21 ENCOUNTER — Encounter: Admitting: Physical Therapy

## 2023-09-22 DIAGNOSIS — N2581 Secondary hyperparathyroidism of renal origin: Secondary | ICD-10-CM | POA: Diagnosis not present

## 2023-09-22 DIAGNOSIS — D509 Iron deficiency anemia, unspecified: Secondary | ICD-10-CM | POA: Diagnosis not present

## 2023-09-22 DIAGNOSIS — Z992 Dependence on renal dialysis: Secondary | ICD-10-CM | POA: Diagnosis not present

## 2023-09-22 DIAGNOSIS — T7840XA Allergy, unspecified, initial encounter: Secondary | ICD-10-CM | POA: Diagnosis not present

## 2023-09-22 DIAGNOSIS — N186 End stage renal disease: Secondary | ICD-10-CM | POA: Diagnosis not present

## 2023-09-22 DIAGNOSIS — I272 Pulmonary hypertension, unspecified: Secondary | ICD-10-CM | POA: Diagnosis not present

## 2023-09-22 DIAGNOSIS — R519 Headache, unspecified: Secondary | ICD-10-CM | POA: Diagnosis not present

## 2023-09-22 DIAGNOSIS — D631 Anemia in chronic kidney disease: Secondary | ICD-10-CM | POA: Diagnosis not present

## 2023-09-24 DIAGNOSIS — R519 Headache, unspecified: Secondary | ICD-10-CM | POA: Diagnosis not present

## 2023-09-24 DIAGNOSIS — D631 Anemia in chronic kidney disease: Secondary | ICD-10-CM | POA: Diagnosis not present

## 2023-09-24 DIAGNOSIS — N186 End stage renal disease: Secondary | ICD-10-CM | POA: Diagnosis not present

## 2023-09-24 DIAGNOSIS — N2581 Secondary hyperparathyroidism of renal origin: Secondary | ICD-10-CM | POA: Diagnosis not present

## 2023-09-24 DIAGNOSIS — D509 Iron deficiency anemia, unspecified: Secondary | ICD-10-CM | POA: Diagnosis not present

## 2023-09-24 DIAGNOSIS — Z992 Dependence on renal dialysis: Secondary | ICD-10-CM | POA: Diagnosis not present

## 2023-09-24 DIAGNOSIS — T7840XA Allergy, unspecified, initial encounter: Secondary | ICD-10-CM | POA: Diagnosis not present

## 2023-09-24 DIAGNOSIS — I272 Pulmonary hypertension, unspecified: Secondary | ICD-10-CM | POA: Diagnosis not present

## 2023-09-27 DIAGNOSIS — D509 Iron deficiency anemia, unspecified: Secondary | ICD-10-CM | POA: Diagnosis not present

## 2023-09-27 DIAGNOSIS — T7840XA Allergy, unspecified, initial encounter: Secondary | ICD-10-CM | POA: Diagnosis not present

## 2023-09-27 DIAGNOSIS — R519 Headache, unspecified: Secondary | ICD-10-CM | POA: Diagnosis not present

## 2023-09-27 DIAGNOSIS — Z992 Dependence on renal dialysis: Secondary | ICD-10-CM | POA: Diagnosis not present

## 2023-09-27 DIAGNOSIS — D631 Anemia in chronic kidney disease: Secondary | ICD-10-CM | POA: Diagnosis not present

## 2023-09-27 DIAGNOSIS — I272 Pulmonary hypertension, unspecified: Secondary | ICD-10-CM | POA: Diagnosis not present

## 2023-09-27 DIAGNOSIS — N186 End stage renal disease: Secondary | ICD-10-CM | POA: Diagnosis not present

## 2023-09-27 DIAGNOSIS — N2581 Secondary hyperparathyroidism of renal origin: Secondary | ICD-10-CM | POA: Diagnosis not present

## 2023-09-28 ENCOUNTER — Ambulatory Visit: Admitting: Neurology

## 2023-09-28 ENCOUNTER — Ambulatory Visit (INDEPENDENT_AMBULATORY_CARE_PROVIDER_SITE_OTHER): Admitting: Neurology

## 2023-09-28 ENCOUNTER — Telehealth: Payer: Self-pay | Admitting: Hematology and Oncology

## 2023-09-28 ENCOUNTER — Inpatient Hospital Stay: Attending: Physician Assistant

## 2023-09-28 ENCOUNTER — Other Ambulatory Visit

## 2023-09-28 ENCOUNTER — Telehealth: Payer: Self-pay | Admitting: Physical Therapy

## 2023-09-28 ENCOUNTER — Encounter: Payer: Self-pay | Admitting: Neurology

## 2023-09-28 ENCOUNTER — Ambulatory Visit: Admitting: Hematology and Oncology

## 2023-09-28 ENCOUNTER — Encounter: Admitting: Physical Therapy

## 2023-09-28 VITALS — BP 116/65 | HR 67 | Ht 65.0 in | Wt 128.0 lb

## 2023-09-28 DIAGNOSIS — I120 Hypertensive chronic kidney disease with stage 5 chronic kidney disease or end stage renal disease: Secondary | ICD-10-CM | POA: Diagnosis not present

## 2023-09-28 DIAGNOSIS — R200 Anesthesia of skin: Secondary | ICD-10-CM

## 2023-09-28 DIAGNOSIS — R202 Paresthesia of skin: Secondary | ICD-10-CM | POA: Diagnosis not present

## 2023-09-28 DIAGNOSIS — R269 Unspecified abnormalities of gait and mobility: Secondary | ICD-10-CM

## 2023-09-28 DIAGNOSIS — D693 Immune thrombocytopenic purpura: Secondary | ICD-10-CM | POA: Insufficient documentation

## 2023-09-28 DIAGNOSIS — Z9981 Dependence on supplemental oxygen: Secondary | ICD-10-CM | POA: Diagnosis not present

## 2023-09-28 DIAGNOSIS — E538 Deficiency of other specified B group vitamins: Secondary | ICD-10-CM | POA: Insufficient documentation

## 2023-09-28 DIAGNOSIS — D631 Anemia in chronic kidney disease: Secondary | ICD-10-CM | POA: Diagnosis not present

## 2023-09-28 DIAGNOSIS — K745 Biliary cirrhosis, unspecified: Secondary | ICD-10-CM | POA: Diagnosis not present

## 2023-09-28 DIAGNOSIS — M069 Rheumatoid arthritis, unspecified: Secondary | ICD-10-CM | POA: Diagnosis not present

## 2023-09-28 DIAGNOSIS — Z7982 Long term (current) use of aspirin: Secondary | ICD-10-CM | POA: Diagnosis not present

## 2023-09-28 DIAGNOSIS — M349 Systemic sclerosis, unspecified: Secondary | ICD-10-CM | POA: Diagnosis not present

## 2023-09-28 DIAGNOSIS — K7460 Unspecified cirrhosis of liver: Secondary | ICD-10-CM | POA: Diagnosis not present

## 2023-09-28 DIAGNOSIS — D649 Anemia, unspecified: Secondary | ICD-10-CM | POA: Insufficient documentation

## 2023-09-28 DIAGNOSIS — Z992 Dependence on renal dialysis: Secondary | ICD-10-CM | POA: Diagnosis not present

## 2023-09-28 DIAGNOSIS — M797 Fibromyalgia: Secondary | ICD-10-CM | POA: Diagnosis not present

## 2023-09-28 DIAGNOSIS — I69151 Hemiplegia and hemiparesis following nontraumatic intracerebral hemorrhage affecting right dominant side: Secondary | ICD-10-CM | POA: Diagnosis not present

## 2023-09-28 DIAGNOSIS — I611 Nontraumatic intracerebral hemorrhage in hemisphere, cortical: Secondary | ICD-10-CM

## 2023-09-28 DIAGNOSIS — R29898 Other symptoms and signs involving the musculoskeletal system: Secondary | ICD-10-CM | POA: Diagnosis not present

## 2023-09-28 DIAGNOSIS — I4891 Unspecified atrial fibrillation: Secondary | ICD-10-CM | POA: Diagnosis not present

## 2023-09-28 DIAGNOSIS — K219 Gastro-esophageal reflux disease without esophagitis: Secondary | ICD-10-CM | POA: Diagnosis not present

## 2023-09-28 DIAGNOSIS — E569 Vitamin deficiency, unspecified: Secondary | ICD-10-CM | POA: Diagnosis not present

## 2023-09-28 DIAGNOSIS — N186 End stage renal disease: Secondary | ICD-10-CM | POA: Diagnosis not present

## 2023-09-28 DIAGNOSIS — I69198 Other sequelae of nontraumatic intracerebral hemorrhage: Secondary | ICD-10-CM | POA: Diagnosis not present

## 2023-09-28 DIAGNOSIS — I7300 Raynaud's syndrome without gangrene: Secondary | ICD-10-CM | POA: Diagnosis not present

## 2023-09-28 DIAGNOSIS — I272 Pulmonary hypertension, unspecified: Secondary | ICD-10-CM | POA: Diagnosis not present

## 2023-09-28 DIAGNOSIS — M109 Gout, unspecified: Secondary | ICD-10-CM | POA: Diagnosis not present

## 2023-09-28 DIAGNOSIS — I081 Rheumatic disorders of both mitral and tricuspid valves: Secondary | ICD-10-CM | POA: Diagnosis not present

## 2023-09-28 DIAGNOSIS — Z9181 History of falling: Secondary | ICD-10-CM | POA: Diagnosis not present

## 2023-09-28 LAB — RETIC PANEL
Immature Retic Fract: 39.7 % — ABNORMAL HIGH (ref 2.3–15.9)
RBC.: 3.76 MIL/uL — ABNORMAL LOW (ref 3.87–5.11)
Retic Count, Absolute: 81.2 K/uL (ref 19.0–186.0)
Retic Ct Pct: 2.2 % (ref 0.4–3.1)
Reticulocyte Hemoglobin: 25.2 pg — ABNORMAL LOW (ref >27.9)

## 2023-09-28 LAB — CBC WITH DIFFERENTIAL (CANCER CENTER ONLY)
Abs Immature Granulocytes: 0.03 K/uL (ref 0.00–0.07)
Basophils Absolute: 0 K/uL (ref 0.0–0.1)
Basophils Relative: 1 %
Eosinophils Absolute: 0.1 K/uL (ref 0.0–0.5)
Eosinophils Relative: 2 %
HCT: 30.7 % — ABNORMAL LOW (ref 36.0–46.0)
Hemoglobin: 9.6 g/dL — ABNORMAL LOW (ref 12.0–15.0)
Immature Granulocytes: 1 %
Lymphocytes Relative: 17 %
Lymphs Abs: 0.6 K/uL — ABNORMAL LOW (ref 0.7–4.0)
MCH: 25.5 pg — ABNORMAL LOW (ref 26.0–34.0)
MCHC: 31.3 g/dL (ref 30.0–36.0)
MCV: 81.4 fL (ref 80.0–100.0)
Monocytes Absolute: 0.3 K/uL (ref 0.1–1.0)
Monocytes Relative: 9 %
Neutro Abs: 2.6 K/uL (ref 1.7–7.7)
Neutrophils Relative %: 70 %
Platelet Count: 168 K/uL (ref 150–400)
RBC: 3.77 MIL/uL — ABNORMAL LOW (ref 3.87–5.11)
RDW: 20.4 % — ABNORMAL HIGH (ref 11.5–15.5)
WBC Count: 3.7 K/uL — ABNORMAL LOW (ref 4.0–10.5)
nRBC: 0 % (ref 0.0–0.2)

## 2023-09-28 LAB — CMP (CANCER CENTER ONLY)
ALT: 11 U/L (ref 0–44)
AST: 16 U/L (ref 15–41)
Albumin: 3.7 g/dL (ref 3.5–5.0)
Alkaline Phosphatase: 109 U/L (ref 38–126)
Anion gap: 9 (ref 5–15)
BUN: 36 mg/dL — ABNORMAL HIGH (ref 8–23)
CO2: 31 mmol/L (ref 22–32)
Calcium: 9.5 mg/dL (ref 8.9–10.3)
Chloride: 99 mmol/L (ref 98–111)
Creatinine: 7.44 mg/dL (ref 0.44–1.00)
GFR, Estimated: 6 mL/min — ABNORMAL LOW (ref >60)
Glucose, Bld: 78 mg/dL (ref 70–99)
Potassium: 3.9 mmol/L (ref 3.5–5.1)
Sodium: 139 mmol/L (ref 135–145)
Total Bilirubin: 0.3 mg/dL (ref 0.0–1.2)
Total Protein: 7.5 g/dL (ref 6.5–8.1)

## 2023-09-28 LAB — FOLATE: Folate: 10.6 ng/mL (ref >5.9)

## 2023-09-28 LAB — IRON AND IRON BINDING CAPACITY (CC-WL,HP ONLY)
Iron: 42 ug/dL (ref 28–170)
Saturation Ratios: 11 % (ref 10.4–31.8)
TIBC: 381 ug/dL (ref 250–450)
UIBC: 339 ug/dL (ref 148–442)

## 2023-09-28 LAB — VITAMIN B12: Vitamin B-12: 806 pg/mL (ref 180–914)

## 2023-09-28 LAB — LIPID PANEL
Cholesterol: 163 mg/dL (ref 0–200)
HDL: 82 mg/dL (ref >40)
LDL Cholesterol: 61 mg/dL (ref 0–99)
Total CHOL/HDL Ratio: 2 ratio
Triglycerides: 100 mg/dL (ref <150)
VLDL: 20 mg/dL (ref 0–40)

## 2023-09-28 LAB — FERRITIN: Ferritin: 79 ng/mL (ref 11–307)

## 2023-09-28 NOTE — Progress Notes (Unsigned)
 CRITICAL VALUE STICKER  CRITICAL VALUE: Crt 7.44  RECEIVER (on-site recipient of call): Rosina   DATE & TIME NOTIFIED: 09/28/23 @ 10:56  MESSENGER (representative from lab): Almarie  MD NOTIFIED: Johnston Police, PA  TIME OF NOTIFICATION: 10:57

## 2023-09-28 NOTE — Telephone Encounter (Signed)
 Discussed continuing pelvic PT with patient today. She plans to grab the next available wait list appt as she has a busy schedule.  Celena Domino, PT, DPT 09/28/23 1:08 PM

## 2023-09-28 NOTE — Patient Instructions (Signed)
 I saw you today for tingling and numbness in your feet and calves. This could be neuropathy, but your old stroke may be hiding some of the usual signs.  I would like to do further investigation with the following: -Blood work today -MRI of your neck to make sure there is no pinching of the spinal cord  Continue gabapentin  for tingling.   You can also try Lidocaine  cream as needed. Apply wear you have pain, tingling, or burning. Wear gloves to prevent your hands being numb. This can be bought over the counter at any drug store or online.  Alpha lipoic acid 600mg  daily has some research data suggesting it helps with nerve health. No major side effects other than <1% of people report upset stomach. This can be taken twice per day (1200mg  daily) if no relief obtained. You can buy this over the counter or online.  Continue home physical therapy.  I will be in touch when I have your results. Please let me know if you have any questions or concerns in the meantime.   I will see you back in clinic in 6 months to check your progress (or sooner if needed).  The physicians and staff at [facility] are committed to providing excellent care. You may receive a survey requesting feedback about your experience at our office. We strive to receive very good responses to the survey questions. If you feel that your experience would prevent you from giving the office a very good  response, please contact our office to try to remedy the situation. We may be reached at [phone]. Thank you for taking the time out of your busy day to complete the survey.  Venetia Potters, MD [facility]  Preventing Falls at [facility] are common, often dreaded events in the lives of older people. Aside from the obvious injuries and even death that may result, fall can cause wide-ranging consequences including loss of independence, mental decline, decreased activity and mobility. Younger people are also at risk of  falling, especially those with chronic illnesses and fatigue.  Ways to reduce risk for falling Examine diet and medications. Warm foods and alcohol dilate blood vessels, which can lead to dizziness when standing. Sleep aids, antidepressants and pain medications can also increase the likelihood of a fall.  Get a vision exam. Poor vision, cataracts and glaucoma increase the chances of falling.  Check foot gear. Shoes should fit snugly and have a sturdy, nonskid sole and a broad, low heel  Participate in a physician-approved exercise program to build and maintain muscle strength and improve balance and coordination. Programs that use ankle weights or stretch bands are excellent for muscle-strengthening. Water aerobics programs and low-impact Tai Chi programs have also been shown to improve balance and coordination.  Increase vitamin D  intake. Vitamin D  improves muscle strength and increases the amount of calcium  the body is able to absorb and deposit in bones.  How to prevent falls from common hazards Floors - Remove all loose wires, cords, and throw rugs. Minimize clutter. Make sure rugs are anchored and smooth. Keep furniture in its usual place.  Chairs -- Use chairs with straight backs, armrests and firm seats. Add firm cushions to existing pieces to add height.  Bathroom - Install grab bars and non-skid tape in the tub or shower. Use a bathtub transfer bench or a shower chair with a back support Use an elevated toilet seat and/or safety rails to assist standing from a low surface. Do not use  towel racks or bathroom tissue holders to help you stand.  Lighting - Make sure halls, stairways, and entrances are well-lit. Install a night light in your bathroom or hallway. Make sure there is a light switch at the top and bottom of the staircase. Turn lights on if you get up in the middle of the night. Make sure lamps or light switches are within reach of the bed if you have to get up during the  night.  Kitchen - Install non-skid rubber mats near the sink and stove. Clean spills immediately. Store frequently used utensils, pots, pans between waist and eye level. This helps prevent reaching and bending. Sit when getting things out of lower cupboards.  Living room/ Bedrooms - Place furniture with wide spaces in between, giving enough room to move around. Establish a route through the living room that gives you something to hold onto as you walk.  Stairs - Make sure treads, rails, and rugs are secure. Install a rail on both sides of the stairs. If stairs are a threat, it might be helpful to arrange most of your activities on the lower level to reduce the number of times you must climb the stairs.  Entrances and doorways - Install metal handles on the walls adjacent to the doorknobs of all doors to make it more secure as you travel through the doorway.  Tips for maintaining balance Keep at least one hand free at all times. Try using a backpack or fanny pack to hold things rather than carrying them in your hands. Never carry objects in both hands when walking as this interferes with keeping your balance.  Attempt to swing both arms from front to back while walking. This might require a conscious effort if Parkinson's disease has diminished your movement. It will, however, help you to maintain balance and posture, and reduce fatigue.  Consciously lift your feet off of the ground when walking. Shuffling and dragging of the feet is a common culprit in losing your balance.  When trying to navigate turns, use a U technique of facing forward and making a wide turn, rather than pivoting sharply.  Try to stand with your feet shoulder-length apart. When your feet are close together for any length of time, you increase your risk of losing your balance and falling.  Do one thing at a time. Don't try to walk and accomplish another task, such as reading or looking around. The decrease in your automatic  reflexes complicates motor function, so the less distraction, the better.  Do not wear rubber or gripping soled shoes, they might catch on the floor and cause tripping.  Move slowly when changing positions. Use deliberate, concentrated movements and, if needed, use a grab bar or walking aid. Count 15 seconds between each movement. For example, when rising from a seated position, wait 15 seconds after standing to begin walking.  If balance is a continuous problem, you might want to consider a walking aid such as a cane, walking stick, or walker. Once you've mastered walking with help, you might be ready to try it on your own again.

## 2023-09-29 DIAGNOSIS — N186 End stage renal disease: Secondary | ICD-10-CM | POA: Diagnosis not present

## 2023-09-29 DIAGNOSIS — Z992 Dependence on renal dialysis: Secondary | ICD-10-CM | POA: Diagnosis not present

## 2023-09-29 DIAGNOSIS — N2581 Secondary hyperparathyroidism of renal origin: Secondary | ICD-10-CM | POA: Diagnosis not present

## 2023-09-29 DIAGNOSIS — R519 Headache, unspecified: Secondary | ICD-10-CM | POA: Diagnosis not present

## 2023-09-29 DIAGNOSIS — I272 Pulmonary hypertension, unspecified: Secondary | ICD-10-CM | POA: Diagnosis not present

## 2023-09-29 DIAGNOSIS — T7840XA Allergy, unspecified, initial encounter: Secondary | ICD-10-CM | POA: Diagnosis not present

## 2023-09-29 DIAGNOSIS — D509 Iron deficiency anemia, unspecified: Secondary | ICD-10-CM | POA: Diagnosis not present

## 2023-09-29 DIAGNOSIS — D631 Anemia in chronic kidney disease: Secondary | ICD-10-CM | POA: Diagnosis not present

## 2023-10-01 DIAGNOSIS — D631 Anemia in chronic kidney disease: Secondary | ICD-10-CM | POA: Diagnosis not present

## 2023-10-01 DIAGNOSIS — I272 Pulmonary hypertension, unspecified: Secondary | ICD-10-CM | POA: Diagnosis not present

## 2023-10-01 DIAGNOSIS — N2581 Secondary hyperparathyroidism of renal origin: Secondary | ICD-10-CM | POA: Diagnosis not present

## 2023-10-01 DIAGNOSIS — R519 Headache, unspecified: Secondary | ICD-10-CM | POA: Diagnosis not present

## 2023-10-01 DIAGNOSIS — D509 Iron deficiency anemia, unspecified: Secondary | ICD-10-CM | POA: Diagnosis not present

## 2023-10-01 DIAGNOSIS — T7840XA Allergy, unspecified, initial encounter: Secondary | ICD-10-CM | POA: Diagnosis not present

## 2023-10-01 DIAGNOSIS — Z992 Dependence on renal dialysis: Secondary | ICD-10-CM | POA: Diagnosis not present

## 2023-10-01 DIAGNOSIS — N186 End stage renal disease: Secondary | ICD-10-CM | POA: Diagnosis not present

## 2023-10-03 ENCOUNTER — Ambulatory Visit (INDEPENDENT_AMBULATORY_CARE_PROVIDER_SITE_OTHER): Admitting: Podiatry

## 2023-10-03 ENCOUNTER — Encounter: Payer: Self-pay | Admitting: Podiatry

## 2023-10-03 DIAGNOSIS — B351 Tinea unguium: Secondary | ICD-10-CM | POA: Diagnosis not present

## 2023-10-03 DIAGNOSIS — M79674 Pain in right toe(s): Secondary | ICD-10-CM | POA: Diagnosis not present

## 2023-10-03 DIAGNOSIS — G629 Polyneuropathy, unspecified: Secondary | ICD-10-CM

## 2023-10-03 DIAGNOSIS — L84 Corns and callosities: Secondary | ICD-10-CM | POA: Diagnosis not present

## 2023-10-03 DIAGNOSIS — M79675 Pain in left toe(s): Secondary | ICD-10-CM | POA: Diagnosis not present

## 2023-10-03 DIAGNOSIS — D689 Coagulation defect, unspecified: Secondary | ICD-10-CM

## 2023-10-04 DIAGNOSIS — D509 Iron deficiency anemia, unspecified: Secondary | ICD-10-CM | POA: Diagnosis not present

## 2023-10-04 DIAGNOSIS — N2581 Secondary hyperparathyroidism of renal origin: Secondary | ICD-10-CM | POA: Diagnosis not present

## 2023-10-04 DIAGNOSIS — N186 End stage renal disease: Secondary | ICD-10-CM | POA: Diagnosis not present

## 2023-10-04 DIAGNOSIS — I272 Pulmonary hypertension, unspecified: Secondary | ICD-10-CM | POA: Diagnosis not present

## 2023-10-04 DIAGNOSIS — T7840XA Allergy, unspecified, initial encounter: Secondary | ICD-10-CM | POA: Diagnosis not present

## 2023-10-04 DIAGNOSIS — R519 Headache, unspecified: Secondary | ICD-10-CM | POA: Diagnosis not present

## 2023-10-04 DIAGNOSIS — D631 Anemia in chronic kidney disease: Secondary | ICD-10-CM | POA: Diagnosis not present

## 2023-10-04 DIAGNOSIS — Z992 Dependence on renal dialysis: Secondary | ICD-10-CM | POA: Diagnosis not present

## 2023-10-04 NOTE — Telephone Encounter (Signed)
 Celena Domino, PT, DPT 10/04/23 3:19 PM

## 2023-10-04 NOTE — Progress Notes (Signed)
 Subjective:  Patient ID: Katie Nunez, female    DOB: 02/05/1955,  MRN: [id number]  ERIK NESSEL presents to clinic today for at risk foot care with history of peripheral neuropathy and callus(es) left foot and right foot and painful mycotic toenails that are difficult to trim. Painful toenails interfere with ambulation. Aggravating factors include wearing enclosed shoe gear. Pain is relieved with periodic professional debridement. Painful calluses are aggravated when weightbearing with and without shoegear. Pain is relieved with periodic professional debridement.  Chief Complaint  Patient presents with   RFC    RFC Non diabetic toenail trim and callus care. LOV with PCP 06/20/23.    New problem(s): None.   PCP is Claudene Pellet, MD.  Allergies  Allergen Reactions   Other Anaphylaxis and Other (See Comments)    Do not use polyflux membrane.  Use alternate heart racing   Iron  Other (See Comments)    Other Reaction(s): Unknown    Due to liver disease   Nitrofuran Derivatives     Advised not to take   Savella [Milnacipran Hcl] Palpitations and Other (See Comments)    Unknown   Tape Rash and Other (See Comments)    Itch- unsure if it was paper or adhesive tape    Review of Systems: Negative except as noted in the HPI.  Objective: No changes noted in today's physical examination. There were no vitals filed for this visit. Katie Nunez is a pleasant 68 y.o. female WD, WN in NAD. AAO x 3. Patient on O2.  Vascular Examination: Capillary refill time immediate b/l. Palpable pedal pulses. Pedal edema b/l. No pain with calf compression b/l. Skin temperature gradient WNL b/l. No cyanosis or clubbing b/l. No ischemia or gangrene noted b/l. Pedal hair absent.  Neurological Examination:  Protective sensation diminished with 10g monofilament b/l.  Dermatological Examination: Pedal skin with normal turgor, texture and tone b/l.  No open wounds. No interdigital macerations.    Toenails 1-5 b/l thick, discolored, elongated with subungual debris and pain on dorsal palpation.   Hyperkeratotic lesion(s) right heel, submet head 2 left foot, and 1st metatarsal head left foot.  No erythema, no edema, no drainage, no fluctuance.  Musculoskeletal Examination: Muscle strength 5/5 to all lower extremity muscle groups bilaterally. Pes planus deformity noted bilateral LE. Utilizes wheelchair for mobility assistance.  Radiographs: None  Assessment/Plan: 1. Pain due to onychomycosis of toenails of both feet   2. Callus   3. Peripheral polyneuropathy   4. Coagulation defect, unspecified   Patient was evaluated and treated. All patient's and/or POA's questions/concerns addressed on today's visit. Toenails 1-5 debrided in length and girth without incident. Callus(es) right heel, submet head 2 left foot, and 1st metatarsal head left foot pared with sharp debridement without incident. Continue soft, supportive shoe gear daily. Report any pedal injuries to medical professional. Call office if there are any questions/concerns. Return in about 3 months (around 01/02/2024).  Delon LITTIE Merlin, DPM      [facility] LOCATION: [address] [place] [address]                   Office [phone]   [facility] LOCATION: [address] [place], [place] [address]  Office [phone]

## 2023-10-05 ENCOUNTER — Ambulatory Visit: Payer: Self-pay | Admitting: Neurology

## 2023-10-05 ENCOUNTER — Encounter: Admitting: Physical Therapy

## 2023-10-05 LAB — VITAMIN B1: Vitamin B1 (Thiamine): 17 nmol/L (ref 8–30)

## 2023-10-05 LAB — VITAMIN B6: Vitamin B6: 4.9 ng/mL (ref 2.1–21.7)

## 2023-10-05 LAB — METHYLMALONIC ACID, SERUM: Methylmalonic Acid, Quant: 644 nmol/L — ABNORMAL HIGH (ref 69–390)

## 2023-10-06 DIAGNOSIS — I120 Hypertensive chronic kidney disease with stage 5 chronic kidney disease or end stage renal disease: Secondary | ICD-10-CM | POA: Diagnosis not present

## 2023-10-06 DIAGNOSIS — I272 Pulmonary hypertension, unspecified: Secondary | ICD-10-CM | POA: Diagnosis not present

## 2023-10-06 DIAGNOSIS — M349 Systemic sclerosis, unspecified: Secondary | ICD-10-CM | POA: Diagnosis not present

## 2023-10-06 DIAGNOSIS — N2581 Secondary hyperparathyroidism of renal origin: Secondary | ICD-10-CM | POA: Diagnosis not present

## 2023-10-06 DIAGNOSIS — I7300 Raynaud's syndrome without gangrene: Secondary | ICD-10-CM | POA: Diagnosis not present

## 2023-10-06 DIAGNOSIS — M069 Rheumatoid arthritis, unspecified: Secondary | ICD-10-CM | POA: Diagnosis not present

## 2023-10-06 DIAGNOSIS — I69198 Other sequelae of nontraumatic intracerebral hemorrhage: Secondary | ICD-10-CM | POA: Diagnosis not present

## 2023-10-06 DIAGNOSIS — Z7982 Long term (current) use of aspirin: Secondary | ICD-10-CM | POA: Diagnosis not present

## 2023-10-06 DIAGNOSIS — Z992 Dependence on renal dialysis: Secondary | ICD-10-CM | POA: Diagnosis not present

## 2023-10-06 DIAGNOSIS — Z9981 Dependence on supplemental oxygen: Secondary | ICD-10-CM | POA: Diagnosis not present

## 2023-10-06 DIAGNOSIS — D509 Iron deficiency anemia, unspecified: Secondary | ICD-10-CM | POA: Diagnosis not present

## 2023-10-06 DIAGNOSIS — T7840XA Allergy, unspecified, initial encounter: Secondary | ICD-10-CM | POA: Diagnosis not present

## 2023-10-06 DIAGNOSIS — R519 Headache, unspecified: Secondary | ICD-10-CM | POA: Diagnosis not present

## 2023-10-06 DIAGNOSIS — K745 Biliary cirrhosis, unspecified: Secondary | ICD-10-CM | POA: Diagnosis not present

## 2023-10-06 DIAGNOSIS — I4891 Unspecified atrial fibrillation: Secondary | ICD-10-CM | POA: Diagnosis not present

## 2023-10-06 DIAGNOSIS — K219 Gastro-esophageal reflux disease without esophagitis: Secondary | ICD-10-CM | POA: Diagnosis not present

## 2023-10-06 DIAGNOSIS — I081 Rheumatic disorders of both mitral and tricuspid valves: Secondary | ICD-10-CM | POA: Diagnosis not present

## 2023-10-06 DIAGNOSIS — N186 End stage renal disease: Secondary | ICD-10-CM | POA: Diagnosis not present

## 2023-10-06 DIAGNOSIS — D631 Anemia in chronic kidney disease: Secondary | ICD-10-CM | POA: Diagnosis not present

## 2023-10-06 DIAGNOSIS — M797 Fibromyalgia: Secondary | ICD-10-CM | POA: Diagnosis not present

## 2023-10-06 DIAGNOSIS — I69151 Hemiplegia and hemiparesis following nontraumatic intracerebral hemorrhage affecting right dominant side: Secondary | ICD-10-CM | POA: Diagnosis not present

## 2023-10-06 DIAGNOSIS — E538 Deficiency of other specified B group vitamins: Secondary | ICD-10-CM | POA: Diagnosis not present

## 2023-10-06 DIAGNOSIS — Z9181 History of falling: Secondary | ICD-10-CM | POA: Diagnosis not present

## 2023-10-06 DIAGNOSIS — R29898 Other symptoms and signs involving the musculoskeletal system: Secondary | ICD-10-CM | POA: Diagnosis not present

## 2023-10-06 DIAGNOSIS — M109 Gout, unspecified: Secondary | ICD-10-CM | POA: Diagnosis not present

## 2023-10-08 DIAGNOSIS — T7840XA Allergy, unspecified, initial encounter: Secondary | ICD-10-CM | POA: Diagnosis not present

## 2023-10-08 DIAGNOSIS — N2581 Secondary hyperparathyroidism of renal origin: Secondary | ICD-10-CM | POA: Diagnosis not present

## 2023-10-08 DIAGNOSIS — D509 Iron deficiency anemia, unspecified: Secondary | ICD-10-CM | POA: Diagnosis not present

## 2023-10-08 DIAGNOSIS — Z992 Dependence on renal dialysis: Secondary | ICD-10-CM | POA: Diagnosis not present

## 2023-10-08 DIAGNOSIS — D631 Anemia in chronic kidney disease: Secondary | ICD-10-CM | POA: Diagnosis not present

## 2023-10-08 DIAGNOSIS — I272 Pulmonary hypertension, unspecified: Secondary | ICD-10-CM | POA: Diagnosis not present

## 2023-10-08 DIAGNOSIS — R519 Headache, unspecified: Secondary | ICD-10-CM | POA: Diagnosis not present

## 2023-10-08 DIAGNOSIS — N186 End stage renal disease: Secondary | ICD-10-CM | POA: Diagnosis not present

## 2023-10-09 ENCOUNTER — Encounter: Payer: Self-pay | Admitting: Physician Assistant

## 2023-10-10 DIAGNOSIS — K118 Other diseases of salivary glands: Secondary | ICD-10-CM | POA: Diagnosis not present

## 2023-10-10 DIAGNOSIS — R040 Epistaxis: Secondary | ICD-10-CM | POA: Diagnosis not present

## 2023-10-10 DIAGNOSIS — J310 Chronic rhinitis: Secondary | ICD-10-CM | POA: Diagnosis not present

## 2023-10-11 DIAGNOSIS — D509 Iron deficiency anemia, unspecified: Secondary | ICD-10-CM | POA: Diagnosis not present

## 2023-10-11 DIAGNOSIS — I272 Pulmonary hypertension, unspecified: Secondary | ICD-10-CM | POA: Diagnosis not present

## 2023-10-11 DIAGNOSIS — T7840XA Allergy, unspecified, initial encounter: Secondary | ICD-10-CM | POA: Diagnosis not present

## 2023-10-11 DIAGNOSIS — I10 Essential (primary) hypertension: Secondary | ICD-10-CM | POA: Diagnosis not present

## 2023-10-11 DIAGNOSIS — D631 Anemia in chronic kidney disease: Secondary | ICD-10-CM | POA: Diagnosis not present

## 2023-10-11 DIAGNOSIS — Z992 Dependence on renal dialysis: Secondary | ICD-10-CM | POA: Diagnosis not present

## 2023-10-11 DIAGNOSIS — R519 Headache, unspecified: Secondary | ICD-10-CM | POA: Diagnosis not present

## 2023-10-11 DIAGNOSIS — I50812 Chronic right heart failure: Secondary | ICD-10-CM | POA: Diagnosis not present

## 2023-10-11 DIAGNOSIS — N186 End stage renal disease: Secondary | ICD-10-CM | POA: Diagnosis not present

## 2023-10-11 DIAGNOSIS — N269 Renal sclerosis, unspecified: Secondary | ICD-10-CM | POA: Diagnosis not present

## 2023-10-11 DIAGNOSIS — E782 Mixed hyperlipidemia: Secondary | ICD-10-CM | POA: Diagnosis not present

## 2023-10-11 DIAGNOSIS — N2581 Secondary hyperparathyroidism of renal origin: Secondary | ICD-10-CM | POA: Diagnosis not present

## 2023-10-12 ENCOUNTER — Encounter: Admitting: Physical Therapy

## 2023-10-12 ENCOUNTER — Ambulatory Visit: Attending: Obstetrics and Gynecology | Admitting: Physical Therapy

## 2023-10-12 DIAGNOSIS — R279 Unspecified lack of coordination: Secondary | ICD-10-CM | POA: Diagnosis present

## 2023-10-12 DIAGNOSIS — Z7189 Other specified counseling: Secondary | ICD-10-CM | POA: Diagnosis not present

## 2023-10-12 DIAGNOSIS — N186 End stage renal disease: Secondary | ICD-10-CM | POA: Diagnosis not present

## 2023-10-12 DIAGNOSIS — R530 Neoplastic (malignant) related fatigue: Secondary | ICD-10-CM | POA: Diagnosis not present

## 2023-10-12 DIAGNOSIS — M6281 Muscle weakness (generalized): Secondary | ICD-10-CM | POA: Diagnosis present

## 2023-10-12 DIAGNOSIS — G4701 Insomnia due to medical condition: Secondary | ICD-10-CM | POA: Diagnosis not present

## 2023-10-12 DIAGNOSIS — R293 Abnormal posture: Secondary | ICD-10-CM | POA: Insufficient documentation

## 2023-10-12 DIAGNOSIS — K5900 Constipation, unspecified: Secondary | ICD-10-CM | POA: Diagnosis not present

## 2023-10-12 NOTE — Therapy (Signed)
 OUTPATIENT PHYSICAL THERAPY FEMALE PELVIC TREATMENT   Patient Name: Katie Nunez MRN: [id number] DOB:1955-02-24, 68 y.o., female Today's Date: 10/12/2023  END OF SESSION:  PT End of Session - 10/12/23 1628     Visit Number 2    Number of Visits 5    Date for Recertification  08/03/23    Authorization Type [employer] MCR    Authorization - Visit Number 1    Authorization - Number of Visits 4    PT Start Time [phone]    PT Stop Time 0455    PT Time Calculation (min) 40 min    Activity Tolerance Patient tolerated treatment well    Behavior During Therapy WFL for tasks assessed/performed           Past Medical History:  Diagnosis Date   Achalasia    Anxiety    Dysplasia of cervix, low grade (CIN 1)    Environmental allergies    all year long (12/27/2016)   ESRD (end stage renal disease) on dialysis (HCC)    TTS; [place] [facility] (12/27/2016)   Fibromyalgia    GERD (gastroesophageal reflux disease)    Gout    Hypertension    IBS (irritable bowel syndrome)    MVP (mitral valve prolapse)    RA (rheumatoid arthritis) (HCC)    FOLLOWED BY DR. SHANAHAN   Raynaud's disease    Scleroderma (HCC)    Seasonal allergies    Thrombocytopenia 07/01/2016   Acute fall to 13,000 07/01/16   Tubular adenoma 01/08/2008   CECUM   Vitamin D  deficiency    Past Surgical History:  Procedure Laterality Date   A/V FISTULAGRAM N/A 02/02/2023   Procedure: A/V Fistulagram;  Surgeon: Norine Manuelita LABOR, MD;  Location: [facility];  Service: Cardiovascular;  Laterality: N/A;   ANKLE FRACTURE SURGERY Right    AV FISTULA PLACEMENT Left 06/28/2016   Procedure: left arm ARTERIOVENOUS (AV) FISTULA CREATION;  Surgeon: Oris Krystal FALCON, MD;  Location: [facility] OR;  Service: Vascular;  Laterality: Left;   BASCILIC VEIN TRANSPOSITION Left 09/27/2016   Procedure: LEFT UPPER ARM CEPHALIC VEIN TRANSPOSITION;  Surgeon: Oris Krystal FALCON, MD;  Location: [facility] OR;  Service: Vascular;  Laterality: Left;   BREAST  BIOPSY     ? side   CESAREAN SECTION  1994   CO2 LASER OF CERVIX     COLONOSCOPY W/ BIOPSIES  01/08/2008   INSERTION OF DIALYSIS CATHETER Right 06/28/2016   Procedure: INSERTION OF DIALYSIS CATHETER, right internal jugular;  Surgeon: Oris Krystal FALCON, MD;  Location: [facility] OR;  Service: Vascular;  Laterality: Right;   MYOMECTOMY     NASAL ENDOSCOPY WITH EPISTAXIS CONTROL N/A 12/29/2019   Procedure: NASAL ENDOSCOPY WITH EPISTAXIS CONTROL;  Surgeon: Karis Clunes, MD;  Location: [facility] OR;  Service: ENT;  Laterality: N/A;   PELVIC LAPAROSCOPY  2011   superficial thrombophlebitis Left 07-2014   Patient Active Problem List   Diagnosis Date Noted   Anal fissure 07/27/2023   Dysphagia 07/27/2023   Feces contents abnormal 07/27/2023   Flatulence, eructation and gas pain 07/27/2023   Secondary Sjogren's syndrome 07/05/2023   Chronic respiratory failure with hypoxia (HCC) 06/01/2023   Impingement syndrome of right shoulder 01/11/2023   Other disorders of phosphorus metabolism 12/11/2022   Edema of right lower extremity 11/01/2022   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, right 11/01/2022   Unspecified lump in unspecified breast 10/24/2022   Pain due to onychomycosis of toenails of  both feet 10/13/2022   Lesion of external ear canal, right 08/26/2022   Telangiectasia, hereditary hemorrhagic, of Rendu, Osler and Weber 08/26/2022   Colitis 06/25/2022   Elevated troponin 06/25/2022   Prolonged QT interval 06/25/2022   Chronic diastolic CHF (congestive heart failure) (HCC) 06/25/2022   GERD (gastroesophageal reflux disease) 06/25/2022   GAD (generalized anxiety disorder) 06/25/2022   Hyperlipidemia 06/25/2022   Atypical chest pain 06/24/2022   Noninfective gastroenteritis and colitis, unspecified 06/24/2022   Epidermoid cyst 05/12/2022   Allergic rhinitis 04/07/2022   Laryngopharyngeal reflux (LPR) 01/15/2022   Parotid mass 01/15/2022   Ankle swelling 11/26/2021    Lipodermatosclerosis of left lower extremity 10/26/2021   Seborrheic dermatitis 10/26/2021   Vascular tumor, skin 10/26/2021   Other muscle spasm 06/25/2021   Dyssynergic defecation 03/12/2021   Vaginal atrophy 03/12/2021   Anorectal ulcer 02/01/2021   Ascites 02/01/2021   Atherosclerosis 02/01/2021   Hypertensive heart disease without congestive heart failure 02/01/2021   Insomnia 02/01/2021   Mitral valve disorder 02/01/2021   Adenomatous polyp of ascending colon 11/08/2020   Neck pain 10/30/2020   Knee locking, left 10/05/2020   Right ventricular dysfunction 08/13/2020   Hemiplegia of dominant side as late effect of cerebrovascular disease (HCC) 04/09/2020   Abnormality of gait 02/25/2020   Protein-calorie malnutrition, severe 01/19/2020   Epistaxis    Sleep disturbance    Slow transit constipation    Hemorrhoids    Chronic systolic congestive heart failure (HCC)    End-stage renal disease on hemodialysis (HCC)    Right hemiparesis (HCC)    Pressure injury of skin 12/20/2019   Intraparenchymal hemorrhage of brain (HCC) 12/18/2019   Cellulitis and abscess of leg 12/16/2019   ICH (intracerebral hemorrhage) (HCC) 12/12/2019   Residual foreign body in soft tissue 12/12/2019   S/P ablation operation for arrhythmia 11/05/2019   Cellulitis 10/10/2019   Clostridium difficile colitis 10/10/2019   PVT (portal vein thrombosis) 10/05/2019   Incontinence of feces 09/10/2019   Other specified diseases of anus and rectum 09/10/2019   Pain 09/03/2019   Trigger index finger of right hand 09/03/2019   Allergy, unspecified, initial encounter 08/27/2019   Anaphylactic shock, unspecified, initial encounter 08/27/2019   Viral disease 08/22/2019   Chronic right-sided heart failure (HCC) 07/09/2019   Supraventricular tachycardia 07/09/2019   Other cirrhosis of liver (HCC) 06/27/2019   Heart failure (HCC) 02/21/2019   Heart palpitations 08/14/2018   Other fluid overload 07/27/2018    Encounter for long-term (current) use of other medications 01/18/2018   Hypothyroidism 01/18/2018   Myalgia and myositis 01/18/2018   Chronic nephritis 01/18/2018   Other long term (current) drug therapy 01/18/2018   Sleep apnea 01/18/2018   Unspecified persistent mental disorders due to conditions classified elsewhere 01/18/2018   Venous reflux 01/18/2018   Vitamin B12 deficiency 01/18/2018   Vitamin D  deficiency 01/18/2018   Obstructive lung disease (HCC) 12/30/2017   Other pruritus 12/27/2017   Eruption cyst 12/19/2017   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 (HCC) 11/28/2017   Limited scleroderma (HCC) 11/28/2017   Chronic cough 11/11/2017   Pulmonary hypertension (HCC) 11/11/2017   Pulmonary arterial hypertension (HCC) 10/07/2017   Acute ITP (HCC) 01/05/2017   ESRD (end stage renal disease) (HCC) 12/28/2016   Encounter for removal of sutures 09/09/2016   Coagulation defect, unspecified 09/01/2016   Underimmunization status 08/05/2016   Hemolytic anemia 07/26/2016   Epistaxis, recurrent 07/26/2016   Unspecified protein-calorie malnutrition 07/13/2016   Aftercare including intermittent dialysis 07/07/2016  Anemia in chronic kidney disease 07/07/2016   Hypokalemia 07/07/2016   Iron  deficiency anemia, unspecified 07/07/2016   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   Nonrheumatic mitral (valve) prolapse 07/07/2016   Irritable bowel syndrome 07/07/2016   Other secondary thrombocytopenia 07/07/2016   Secondary hyperparathyroidism of renal origin 07/07/2016   Thrombocytopenia 07/01/2016   ARF (acute renal failure) 06/25/2016   Anxiety 06/25/2016   Bilateral carpal tunnel syndrome 10/22/2015   Chronic gout without tophus 10/22/2015   Chronic nonintractable headache 10/08/2015   Fibroid uterus 01/03/2012   H/O vitamin D  deficiency 01/03/2012   Post-menopausal 01/03/2012   Hereditary and idiopathic peripheral neuropathy 11/19/2011   Intestinal  malabsorption 11/19/2011   Lichen planus 11/19/2011   Low back pain 11/19/2011   Diffuse spasm of esophagus 11/11/2011   ESR raised 11/11/2011   Postinflammatory pulmonary fibrosis (HCC) 11/11/2011   Scleroderma (HCC) 11/16/2010   Rheumatoid arthritis (HCC) 11/16/2010   Raynaud's disease 11/16/2010   Symptomatic menopausal or female climacteric states 11/16/2010    PCP: Claudene Pellet, MD  REFERRING PROVIDER: Alvia Dorothyann LABOR, MD   REFERRING DIAG: K56.41 (ICD-10-CM) - Fecal impaction  THERAPY DIAG:  Muscle weakness (generalized)  Unspecified lack of coordination  Abnormal posture  Rationale for Evaluation and Treatment: Rehabilitation  ONSET DATE: unknown (chronic)  SUBJECTIVE:                                                                                                                                                                                           SUBJECTIVE STATEMENT: She is getting a colonoscopy in January. She has been able to have bowel movements but it feels like there is something still inside when she is done.   From eval: Patient reports to PFPT with fecal impaction. She has dealt with constipation for quite some time, and recently she had a pelvic examination that revealed dysfunction in the musculature with evacuation of stool. She is technically constipated and also cannot empty stool well due to feeling like there is a pocket where the stool is collecting. She feels like she is impacted. She also deals with hemorrhoids from straining to pass bowel movements. Her sleep is disrupted due to needing to have a bowel movement constantly. She has abdominal bloating when she is constipated.  Fluid intake: couple of glasses of water per day, 1 cup of coffee per day with her miralax , enjoys herbal teas (1 per day), a couple of sodas per day. Fruit juices occasionally   PAIN:  Are you having pain? No NPRS scale: 0/10  PRECAUTIONS: None  RED  FLAGS: Bowel or bladder incontinence: Yes: has fecal leakage when she takes stool  softeners - thinks that the fecal incontinence is from the softeners but still feels impacted   WEIGHT BEARING RESTRICTIONS: No  FALLS:  Has patient fallen in last 6 months? No  OCCUPATION: retired  ACTIVITY LEVEL : no   PLOF: Independent with household mobility with device  PATIENT GOALS: to have normal bowel movements and less bloating/constipation  PERTINENT HISTORY:  Hx: c-section 1994, anxiety, GERD, hypertension, IBS, RA  BOWEL MOVEMENT: Pain with bowel movement: Yes Type of bowel movement:Type (Bristol Stool Scale) 1-7, Frequency daily, multiple times depending on what she has taken, Strain yes if constipated, and Splinting no Fully empty rectum: No Leakage: Yes: she will have some leakage with looser stool occasionally, but this is usually not an issues  Pads: No Fiber supplement/laxative Yes - lactolose and miralax    URINATION: Pain with urination: No Fully empty bladder: Yes:   Stream: Strong Urgency: Yes  Frequency: within normal limits  Leakage: none Pads: No  INTERCOURSE:  Ability to have vaginal penetration Yes  Pain with intercourse: none DrynessYes   PREGNANCY: C-section deliveries 1 Currently pregnant No  PROLAPSE: None  OBJECTIVE:  Note: Objective measures were completed at Evaluation unless otherwise noted.  PATIENT SURVEYS:  PFIQ-7: 20  COGNITION: Overall cognitive status: Within functional limits for tasks assessed     SENSATION: Light touch: Appears intact  LUMBAR SPECIAL TESTS:  Single leg stance test: Positive  FUNCTIONAL TESTS:  Squat test: weight shift to the left, dynamic knee valgus with loading, general lumbopelvic stiffness present with transferring   GAIT: Assistive device utilized: Environmental consultant - 4 wheeled Comments: moderate trendelenburg gait pattern on right LE with ambulation   POSTURE: rounded shoulders, forward head, increased thoracic  kyphosis, and flexed trunk   LUMBARAROM/PROM:  A/PROM A/PROM  eval  Flexion 50% limited  Extension 50% limited  Right lateral flexion 25% limited  Left lateral flexion 25% limited  Right rotation 25% limited  Left rotation 25% limited   (Blank rows = not tested)  LOWER EXTREMITY ROM: within functional limits   LOWER EXTREMITY MMT:  MMT Right eval Left eval  Hip flexion 3+ 4  Hip extension 3+ 4  Hip abduction 3+ 3+  Hip adduction 4 4  Hip internal rotation    Hip external rotation    Knee flexion 4 4  Knee extension 4 4  Ankle dorsiflexion    Ankle plantarflexion    Ankle inversion    Ankle eversion     (Blank rows = not tested) PALPATION:   General: no significant tenderness to palpation of bilateral adductors, hip flexors, or lumbopelvic musculature. Some tenderness to palpation of bilateral ASIS landmarks   Pelvic Alignment: within normal limits   Abdominal: upper chest breathing and abdominal bracing present in seated position. With supine diaphragmatic breathing, patient demonstrates decreased lower rib excursion with inhalation                 External Perineal Exam: rectal examination to be performed at first follow up visit due to time limitations.                             Internal Pelvic Floor: rectal examination to be performed at first follow up visit due to time limitations.  Patient confirms identification and approves PT to assess internal pelvic floor and treatment Yes No emotional/communication barriers or cognitive limitation. Patient is motivated to learn. Patient understands and agrees with treatment goals and plan. PT explains  patient will be examined in standing, sitting, and lying down to see how their muscles and joints work. When they are ready, they will be asked to remove their underwear so PT can examine their perineum. The patient is also given the option of providing their own chaperone as one is not provided in our facility. The patient  also has the right and is explained the right to defer or refuse any part of the evaluation or treatment including the internal exam. With the patient's consent, PT will use one gloved finger to gently assess the muscles of the pelvic floor, seeing how well it contracts and relaxes and if there is muscle symmetry. After, the patient will get dressed and PT and patient will discuss exam findings and plan of care. PT and patient discuss plan of care, schedule, attendance policy and HEP activities.  PELVIC MMT: rectal examination to be performed at first follow up visit due to time limitations.   MMT eval  Vaginal   Internal Anal Sphincter   External Anal Sphincter   Puborectalis   Diastasis Recti   (Blank rows = not tested)       TONE: rectal examination to be performed at first follow up visit due to time limitations.  PROLAPSE: rectal examination to be performed at first follow up visit due to time limitations.  TODAY'S TREATMENT:                                                                                                                              DATE:   EVAL 07/06/23: Examination completed, findings reviewed, pt educated on POC, HEP, and self care. Pt motivated to participate in PT and agreeable to attempt recommendations.   Neuro re-ed/Self care:  How to pass bowel movements:  Sit all the way down, feet flat on the floor or on your toilet stool  Take 3 deep breaths when you sit down to relax your anus  Imagine you are fogging up a mirror with your breath as you EXHALE and GENTLY PUSH DOWN to evacuate the stool  Do a potty dance side/side and forward/backward to see if there is any more stool to push out, and if there is, do your breathing technique again to pass the remaining stool  About Abdominal Massage Positioning You can practice abdominal massage with oil while lying down, or in the shower with soap.  Some people find that it is just as effective to do the massage through  clothing while sitting or standing. How to Massage Start by placing your finger tips or knuckles on your right side, just inside your hip bone.  Make small circular movements while you move upward toward your rib cage.   Once you reach the bottom right side of your rib cage, take your circular movements across to the left side of the bottom of your rib cage.  Next, move downward until you reach the inside of your left hip bone.  This is the path your feces travel in your colon. Continue to perform your abdominal massage in this pattern for 10 minutes each day.   10/12/23: Internal rectal examination  Supine diaphragmatic breathing + pelvic floor contraction with cueing to pull finger up and in while you exhale 2x10  Supine pelvic floor quick flick contractions + diaphragmatic breathing 2x10   PATIENT EDUCATION:  Education details: relative anatomy and the connection between the diaphragm and pelvic floor, constipation management and education, toileting techniques for stool evacuation, abdominal massage to encourage transit of stool and to decrease abdominal bloating/discomfort at rest Person educated: Patient Education method: Explanation, Demonstration, Tactile cues, Verbal cues, and Handouts Education comprehension: verbalized understanding, returned demonstration, verbal cues required, tactile cues required, and needs further education  HOME EXERCISE PROGRAM: Access Code: [email or web address] URL: [email or web address] Date: 10/12/2023 Prepared by: Celena Domino  Exercises - Supine Pelvic Floor Contraction  - 1 x daily - 7 x weekly - 3 sets - 10 reps - Quick Flick Pelvic Floor Contractions in Hooklying  - 1 x daily - 7 x weekly - 3 sets - 10 reps  ASSESSMENT:  CLINICAL IMPRESSION: Patient is a 68 y.o. female  who was seen today for physical therapy treatment for fecal impaction/constipation. Internal rectal examination performed today and patient demonstrates dyssynergy with pelvic  floor contractions. Once cued, patient was able to properly coordinate her pelvic floor with diaphragmatic breathing, and exercises were provided for her to practice at home. No pain during today's exam. Overall, patient tolerated session very well and Pt would benefit from additional PT to further address deficits.    OBJECTIVE IMPAIRMENTS: decreased coordination, decreased endurance, decreased knowledge of condition, decreased mobility, difficulty walking, decreased strength, and postural dysfunction.   ACTIVITY LIMITATIONS: continence and toileting  PARTICIPATION LIMITATIONS: community activity  PERSONAL FACTORS: Age, Past/current experiences, Time since onset of injury/illness/exacerbation, and 3+ comorbidities: anxiety, GERD, hypertension, IBS, RA are also affecting patient's functional outcome.   REHAB POTENTIAL: Good  CLINICAL DECISION MAKING: Stable/uncomplicated  EVALUATION COMPLEXITY: Low   GOALS: Goals reviewed with patient? Yes  SHORT TERM GOALS: Target date: 08/03/2023  Pt will be independent with HEP.  Baseline: Goal status: INITIAL  2.  Pt will be independent with use of squatty potty, relaxed toileting mechanics, and improved bowel movement techniques in order to increase ease of bowel movements and complete evacuation.   Baseline:  Goal status: INITIAL  3.  Pt will report 50% less abdominal bloating and discomfort due to improved muscle tone throughout the core and improved overall large intestine motility due to implementation of bowel massage daily. Baseline:  Goal status: INITIAL  LONG TERM GOALS: Target date: 01/05/2024  Pt will be independent with advanced HEP.  Baseline:  Goal status: INITIAL  2.  Pt will report her BMs are complete due to improved bowel habits and evacuation techniques to improve quality of life and to prevent sleep disruption due to discomfort from constipation. Baseline:  Goal status: INITIAL  3.  Pt will report 90% less abdominal  bloating and discomfort due to improved muscle tone throughout the core and improved overall large intestine motility due to implementation of bowel massage daily. Baseline:  Goal status: INITIAL  4.  Pt will be independent with diaphragmatic breathing and down training activities in order to improve pelvic floor relaxation.  Baseline:  Goal status: INITIAL  5.  Pt to demonstrate at least 4/5 bil hip strength for improved pelvic stability and functional squats without discomfort to improve quality of  life.  Baseline:  Goal status: INITIAL  PLAN:  PT FREQUENCY: 1-2x/week  PT DURATION: 6 months  PLANNED INTERVENTIONS: 97110-Therapeutic exercises, 97530- Therapeutic activity, 97112- Neuromuscular re-education, 97535- Self Care, [id number]- Manual therapy, Patient/Family education, Taping, Joint mobilization, Spinal mobilization, Scar mobilization, Cryotherapy, and Moist heat  PLAN FOR NEXT SESSION: rectal examination, introduction to pelvic floor training with breathing techniques, introduce downtraining stretches to help with overall mobility and general lumbopelvic/abdominal tension, bowel massage for increased colonic transit  Celena JAYSON Domino, PT 10/12/2023, 4:53 PM

## 2023-10-13 NOTE — Progress Notes (Signed)
 [facility] [phone]   [phone]  PROGRESS NOTE  I connected with Katie Nunez  on 10/14/23 by telephone visit and verified that I am speaking with the correct person using two identifiers.   I discussed the limitations, risks, security and privacy concerns of performing an evaluation and management service by telemedicine and the availability of in-person appointments. I also discussed with the patient that there may be a patient responsible charge related to this service. The patient expressed understanding and agreed to proceed.  Patient's location: Home Provider's location: Office  Patient Care Team: Claudene Pellet, MD as PCP - General Freddie Lynwood HERO, MD as Consulting Physician (Oncology) Early, Krystal FALCON, MD (Inactive) as Consulting Physician (Vascular Surgery) Center, [facility], Gladis, MD as Consulting Physician (Nephrology) Jeryl Pac, MD as Referring Physician (Internal Medicine) Edda Larve, MD as Consulting Physician (Cardiology)  Hematological/Oncological History # ITP # Anemia in Setting of ESRD on Dialysis # Leukopenia in Setting of Cirrhosis 05/2017: treated with steroid taper and rituximab  with Dr. Freddie 11/18/2021: last visit with Dr. Amadeo 04/12/2022: transfer care to Dr. Federico   Interval History:  Katie Nunez 68 y.o. female with medical history significant for ITP who presents for a follow up visit. The patient's last visit was 03/30/2023. In the interim since the last visit she denies any changes to her health.   On exam today Katie Nunez reports feeling fatigued which impacts her ADLs.  She has a good appetite and denies any weight changes.  Home health has started to come to her home to help with her daily routine and medication management.  She continues with dialysis, 3 times a week.  She denies nausea, vomiting or abdominal pain.  She does have some constipation with the dialysis.   She denies easy bruising or signs of active bleeding.  Patient denies fevers, chills, night sweats, shortness of breath, chest pain or cough.  She has no other complaints.  Rest of the 10 point ROS as below.  MEDICAL HISTORY:  Past Medical History:  Diagnosis Date   Achalasia    Anxiety    Dysplasia of cervix, low grade (CIN 1)    Environmental allergies    all year long (12/27/2016)   ESRD (end stage renal disease) on dialysis (HCC)    TTS; [place] (12/27/2016)   Fibromyalgia    GERD (gastroesophageal reflux disease)    Gout    Hypertension    IBS (irritable bowel syndrome)    MVP (mitral valve prolapse)    RA (rheumatoid arthritis) (HCC)    FOLLOWED BY DR. SHANAHAN   Raynaud's disease    Scleroderma (HCC)    Seasonal allergies    Thrombocytopenia 07/01/2016   Acute fall to 13,000 07/01/16   Tubular adenoma 01/08/2008   CECUM   Vitamin D  deficiency     SURGICAL HISTORY: Past Surgical History:  Procedure Laterality Date   A/V FISTULAGRAM N/A 02/02/2023   Procedure: A/V Fistulagram;  Surgeon: Norine Manuelita LABOR, MD;  Location: [facility] INVASIVE CV LAB;  Service: Cardiovascular;  Laterality: N/A;   ANKLE FRACTURE SURGERY Right    AV FISTULA PLACEMENT Left 06/28/2016   Procedure: left arm ARTERIOVENOUS (AV) FISTULA CREATION;  Surgeon: Oris Krystal FALCON, MD;  Location: [facility] OR;  Service: Vascular;  Laterality: Left;   BASCILIC VEIN TRANSPOSITION Left 09/27/2016   Procedure: LEFT UPPER ARM CEPHALIC VEIN TRANSPOSITION;  Surgeon: Oris Krystal FALCON, MD;  Location: [facility] OR;  Service: Vascular;  Laterality:  Left;   BREAST BIOPSY     ? side   CESAREAN SECTION  1994   CO2 LASER OF CERVIX     COLONOSCOPY W/ BIOPSIES  01/08/2008   INSERTION OF DIALYSIS CATHETER Right 06/28/2016   Procedure: INSERTION OF DIALYSIS CATHETER, right internal jugular;  Surgeon: Oris Krystal FALCON, MD;  Location: [facility] OR;  Service: Vascular;  Laterality: Right;   MYOMECTOMY     NASAL ENDOSCOPY WITH EPISTAXIS CONTROL N/A  12/29/2019   Procedure: NASAL ENDOSCOPY WITH EPISTAXIS CONTROL;  Surgeon: Karis Clunes, MD;  Location: [facility] OR;  Service: ENT;  Laterality: N/A;   PELVIC LAPAROSCOPY  2011   superficial thrombophlebitis Left 07-2014    SOCIAL HISTORY: Social History   Socioeconomic History   Marital status: Married    Spouse name: Not on file   Number of children: Not on file   Years of education: Not on file   Highest education level: Not on file  Occupational History   Not on file  Tobacco Use   Smoking status: Never   Smokeless tobacco: Never  Vaping Use   Vaping status: Never Used  Substance and Sexual Activity   Alcohol use: No   Drug use: No   Sexual activity: Not Currently    Birth control/protection: Post-menopausal  Other Topics Concern   Not on file  Social History Narrative   Lives in [place]   Are you right handed or left handed? right   Are you currently employed ?    What is your current occupation? retired   Do you live at home alone?   Who lives with you? husband   What type of home do you live in: 1 story or 2 story? two    Caffeine 1 cup coffee   Social Drivers of Corporate investment banker Strain: Not on file  Food Insecurity: Low Risk  (04/06/2023)   Received from [facility]   Hunger Vital Sign    Within the past 12 months, you worried that your food would run out before you got money to buy more: Never true    Within the past 12 months, the food you bought just didn't last and you didn't have money to get more. : Never true  Transportation Needs: No Transportation Needs (04/06/2023)   Received from [facility]    In the past 12 months, has lack of reliable transportation kept you from medical appointments, meetings, work or from getting things needed for daily living? : No  Physical Activity: Not on file  Stress: Not on file  Social Connections: Unknown (05/22/2021)   Received from [facility]   Social Network    Social Network: Not on  file  Intimate Partner Violence: Not At Risk (06/24/2022)   Humiliation, Afraid, Rape, and Kick questionnaire    Fear of Current or Ex-Partner: No    Emotionally Abused: No    Physically Abused: No    Sexually Abused: No    FAMILY HISTORY: Family History  Problem Relation Age of Onset   Cerebral aneurysm Mother    Hypertension Mother    Diabetes Mother    Heart disease Father    Skin telangiectasia Maternal Aunt        possible telangiectasia, patient recalls reddish birthmarks   Heart disease Maternal Aunt    Diabetes Maternal Grandmother    Heart disease Paternal Grandfather    Cerebral palsy Cousin        1ST COUSIN?  Diabetes Paternal Grandmother    Heart attack Maternal Grandfather    Colon cancer Maternal Uncle        older than 38 yo at diagnosis   Cerebral palsy Other    Stomach cancer Paternal Uncle     ALLERGIES:  is allergic to other, iron , nitrofuran derivatives, savella [milnacipran hcl], and tape.  MEDICATIONS:  Current Outpatient Medications  Medication Sig Dispense Refill   Cyanocobalamin  (VITAMIN B-12) 1000 MCG SUBL Place 1 tablet (1,000 mcg total) under the tongue daily. 30 tablet 3   acetaminophen  (TYLENOL ) 325 MG tablet Take 650 mg by mouth every 6 (six) hours as needed for mild pain (pain score 1-3), moderate pain (pain score 4-6) or headache.     albuterol  (VENTOLIN  HFA) 108 (90 Base) MCG/ACT inhaler INHALE 2 PUFFS INTO THE LUNGS EVERY 6 HOURS AS NEEDED FOR WHEEZING OR SHORTNESS OF BREATH 6.7 g 11   ambrisentan  (LETAIRIS ) 10 MG tablet Take 10 mg by mouth daily.     aspirin  EC 81 MG tablet Take 1 tablet (81 mg total) by mouth daily. Swallow whole. 30 tablet 11   atorvastatin  (LIPITOR ) 80 MG tablet Take 1 tablet (80 mg total) by mouth daily. 90 tablet 3   AURYXIA  1 GM 210 MG(Fe) tablet 8 tablets.     azelastine  (ASTELIN ) 0.1 % nasal spray Place 1 spray into both nostrils 2 (two) times daily as needed for rhinitis. Use in each nostril as directed 30 mL 12    bisacodyl  (DULCOLAX) 5 MG EC tablet Take 1 tablet (5 mg total) by mouth daily as needed for severe constipation. 10 tablet 0   camphor-menthol  (SARNA) lotion Apply topically 2 (two) times daily. 222 mL 0   cinacalcet  (SENSIPAR ) 60 MG tablet Take 60 mg by mouth Every Tuesday,Thursday,and Saturday with dialysis.     clonazePAM  (KLONOPIN ) 0.5 MG tablet Take 1 tablet (0.5 mg total) by mouth 2 (two) times daily as needed for anxiety. 30 tablet 0   COMIRNATY syringe      cyclobenzaprine  (FLEXERIL ) 5 MG tablet Take 1 tablet (5 mg total) by mouth every 8 (eight) hours as needed for muscle spasms. 30 tablet 0   docusate sodium  (COLACE) 100 MG capsule Take 2 capsules (200 mg total) by mouth daily. 60 capsule 0   famotidine  (PEPCID ) 20 MG tablet Take 1 tablet (20 mg total) by mouth daily as needed for heartburn or indigestion. 30 tablet 0   fluticasone  (FLONASE ) 50 MCG/ACT nasal spray Place 1 spray into both nostrils daily for 7 days. 15.8 mL 0   folic acid  (FOLVITE ) 1 MG tablet Take 1 tablet (1 mg total) by mouth daily. 90 tablet 2   hydrOXYzine  (ATARAX /VISTARIL ) 25 MG tablet Take 25 mg by mouth every 8 (eight) hours as needed for itching.      hyoscyamine  (ANASPAZ ) 0.125 MG TBDP disintergrating tablet Place 0.125 mg under the tongue every 4 (four) hours as needed for cramping.     lanthanum (FOSRENOL) 1000 MG chewable tablet Chew 1,000-2,000 mg by mouth 3 (three) times daily with meals. Snack 1000 mg     levocetirizine (XYZAL ) 5 MG tablet Take 5 mg by mouth every evening.     linaclotide (LINZESS) 72 MCG capsule Take 72 mcg by mouth.     Methoxy PEG-Epoetin  Beta (MIRCERA IJ) Mircera     multivitamin (RENA-VIT) TABS tablet Take 1 tablet by mouth daily at 6 (six) AM. 30 tablet 0   pilocarpine (SALAGEN) 5 MG tablet Take 5 mg by  mouth.     polyethylene glycol (MIRALAX  / GLYCOLAX ) 17 g packet Take 17 g by mouth 2 (two) times daily. 14 each 0   Selexipag  (UPTRAVI ) 800 MCG TABS Take 1 tablet (800 mcg total) by  mouth in the morning and at bedtime. 60 tablet 0   Tiotropium Bromide-Olodaterol (STIOLTO RESPIMAT ) 2.5-2.5 MCG/ACT AERS Inhale 2 puffs into the lungs daily.     tiZANidine (ZANAFLEX) 4 MG tablet Take 4 mg by mouth.     topiramate  (TOPAMAX ) 25 MG tablet Take 1 tablet (25 mg total) by mouth 2 (two) times daily. 120 tablet 3   WINREVAIR subcutaneous injection Inject into the skin every 21 ( twenty-one) days.     No current facility-administered medications for this visit.    REVIEW OF SYSTEMS:   Constitutional: ( - ) fevers, ( - )  chills , ( - ) night sweats Eyes: ( - ) blurriness of vision, ( - ) double vision, ( - ) watery eyes Ears, nose, mouth, throat, and face: ( - ) mucositis, ( - ) sore throat Respiratory: ( - ) cough, ( - ) dyspnea, ( - ) wheezes Cardiovascular: ( - ) palpitation, ( - ) chest discomfort, ( - ) lower extremity swelling Gastrointestinal:  ( - ) nausea, ( - ) heartburn, ( - ) change in bowel habits Skin: ( - ) abnormal skin rashes Lymphatics: ( - ) new lymphadenopathy, ( - ) easy bruising Neurological: ( - ) numbness, ( - ) tingling, ( - ) new weaknesses Behavioral/Psych: ( - ) mood change, ( - ) new changes  All other systems were reviewed with the patient and are negative.  PHYSICAL EXAMINATION: Not performed due to virtual visit.   LABORATORY DATA:  I have reviewed the data as listed    Latest Ref Rng & Units 09/28/2023   10:05 AM 06/29/2023    9:24 AM 03/23/2023   10:53 AM  CBC  WBC 4.0 - 10.5 K/uL 3.7  3.8  4.0   Hemoglobin 12.0 - 15.0 g/dL 9.6  9.2  89.3   Hematocrit 36.0 - 46.0 % 30.7  29.4  33.2   Platelets 150 - 400 K/uL 168  177  234        Latest Ref Rng & Units 09/28/2023   10:05 AM 06/29/2023    9:24 AM 03/23/2023   10:53 AM  CMP  Glucose 70 - 99 mg/dL 78  84  80   BUN 8 - 23 mg/dL 36  34  29   Creatinine 0.44 - 1.00 mg/dL 2.55  2.54  2.71   Sodium 135 - 145 mmol/L 139  143  141   Potassium 3.5 - 5.1 mmol/L 3.9  4.0  3.7   Chloride 98 -  111 mmol/L 99  103  98   CO2 22 - 32 mmol/L 31  31  32   Calcium  8.9 - 10.3 mg/dL 9.5  9.2  9.9   Total Protein 6.5 - 8.1 g/dL 7.5  6.9  7.8   Total Bilirubin 0.0 - 1.2 mg/dL 0.3  0.3  0.3   Alkaline Phos 38 - 126 U/L 109  98  119   AST 15 - 41 U/L 16  17  13    ALT 0 - 44 U/L 11  11  8     RADIOGRAPHIC STUDIES: No results found.  ASSESSMENT & PLAN Katie Nunez is a 68 y.o. female with medical history significant for ITP who presents for a follow  up visit.  # Normocytic anemia --Multifactorial 2/2 to ESRD, vitamin B12 and folate deficiency. --Currently on PO vitamin B12 and folic acid  supplementation --Workup from 04/12/2022 ruled out monoclonal gammopathy and iron  deficiency. -- Agree with patient's nephrologist administering EPO shots with her dialysis.  Target hemoglobin greater than 10.0 PLAN: --Labs from 09/28/2023 showed stable anemia with Hgb 9.6, MCV 81.4. No evidence of B12 deficiency, folate deficiency . Borderline iron  deficiency with saturation 11%.  --Patient reports having constipation so we will try to avoid p.o. iron  therapy.  Will reach out to nephrology team to determine if they can increase her IV supplementation with dialysis. --Patient stopped her B12 supplementation so we will resume with the new prescription.  Patient questioned if she was absorbing B12 pills so we will try the sublingual formulation. -- Return to clinic in 3 months for labs only and 6 months for labs/follow up  # ITP -- Patient last required steroid therapy in May 2019. Also received rituximab  at that time. -- Labs from 03/23/2023 showed Plt back to normal.  -- No clear indication for intervention at this time.  Would consider replacing steroid therapy in the event her platelets were to drop below 30.  # Leukopenia -- Stable over last several years, likely secondary to cirrhosis. -- Cirrhosis and ITP are likely reasons why platelets are mildly decreased. -- Labs from 03/23/2023 show WBC back to  normal.    Orders Placed This Encounter  Procedures   CBC with Differential (Cancer Center Only)    Standing Status:   Future    Expected Date:   01/14/2024    Expiration Date:   04/13/2024   CMP (Cancer Center only)    Standing Status:   Future    Expected Date:   01/14/2024    Expiration Date:   04/13/2024   Ferritin    Standing Status:   Future    Expected Date:   01/14/2024    Expiration Date:   04/13/2024   Iron  and Iron  Binding Capacity ([facility] only)    Standing Status:   Future    Expected Date:   01/14/2024    Expiration Date:   04/13/2024   Vitamin B12    Standing Status:   Future    Expected Date:   01/14/2024    Expiration Date:   04/13/2024   Methylmalonic acid, serum    Standing Status:   Future    Expected Date:   01/14/2024    Expiration Date:   04/13/2024    All questions were answered. The patient knows to call the clinic with any problems, questions or concerns.  I have spent a total of 25 minutes minutes of  non-face-to-face time, preparing to see the patient,  counseling and educating the patient, documenting clinical information in the electronic health record, independently interpreting results and communicating results to the patient, and care coordination.   Johnston Police PA-C Dept of Hematology and Oncology [facility] Phone: [phone]    10/14/2023 9:56 AM

## 2023-10-14 ENCOUNTER — Inpatient Hospital Stay: Attending: Physician Assistant | Admitting: Physician Assistant

## 2023-10-14 DIAGNOSIS — Z992 Dependence on renal dialysis: Secondary | ICD-10-CM | POA: Insufficient documentation

## 2023-10-14 DIAGNOSIS — D693 Immune thrombocytopenic purpura: Secondary | ICD-10-CM | POA: Insufficient documentation

## 2023-10-14 DIAGNOSIS — D508 Other iron deficiency anemias: Secondary | ICD-10-CM | POA: Diagnosis not present

## 2023-10-14 DIAGNOSIS — E538 Deficiency of other specified B group vitamins: Secondary | ICD-10-CM | POA: Insufficient documentation

## 2023-10-14 DIAGNOSIS — K7460 Unspecified cirrhosis of liver: Secondary | ICD-10-CM | POA: Insufficient documentation

## 2023-10-14 DIAGNOSIS — D649 Anemia, unspecified: Secondary | ICD-10-CM | POA: Diagnosis not present

## 2023-10-14 DIAGNOSIS — D72819 Decreased white blood cell count, unspecified: Secondary | ICD-10-CM | POA: Insufficient documentation

## 2023-10-14 DIAGNOSIS — D631 Anemia in chronic kidney disease: Secondary | ICD-10-CM | POA: Insufficient documentation

## 2023-10-14 DIAGNOSIS — N186 End stage renal disease: Secondary | ICD-10-CM | POA: Insufficient documentation

## 2023-10-14 MED ORDER — VITAMIN B-12 1000 MCG SL SUBL: 1000.0000 ug | SUBLINGUAL_TABLET | Freq: Every day | SUBLINGUAL | 3 refills | Status: AC

## 2023-10-19 ENCOUNTER — Encounter: Admitting: Physical Therapy

## 2023-10-21 ENCOUNTER — Ambulatory Visit
Admission: RE | Admit: 2023-10-21 | Discharge: 2023-10-21 | Disposition: A | Discharge status: Home / Self Care | Source: Ambulatory Visit | Attending: Neurology | Admitting: Neurology

## 2023-10-21 DIAGNOSIS — R269 Unspecified abnormalities of gait and mobility: Secondary | ICD-10-CM

## 2023-10-21 DIAGNOSIS — M4802 Spinal stenosis, cervical region: Secondary | ICD-10-CM | POA: Diagnosis not present

## 2023-10-21 DIAGNOSIS — M4312 Spondylolisthesis, cervical region: Secondary | ICD-10-CM | POA: Diagnosis not present

## 2023-10-21 DIAGNOSIS — R200 Anesthesia of skin: Secondary | ICD-10-CM

## 2023-10-21 DIAGNOSIS — I611 Nontraumatic intracerebral hemorrhage in hemisphere, cortical: Secondary | ICD-10-CM

## 2023-10-24 DIAGNOSIS — M3210 Systemic lupus erythematosus, organ or system involvement unspecified: Secondary | ICD-10-CM | POA: Diagnosis not present

## 2023-10-24 DIAGNOSIS — I7300 Raynaud's syndrome without gangrene: Secondary | ICD-10-CM | POA: Diagnosis not present

## 2023-10-24 DIAGNOSIS — Z0000 Encounter for general adult medical examination without abnormal findings: Secondary | ICD-10-CM | POA: Diagnosis not present

## 2023-10-24 DIAGNOSIS — N186 End stage renal disease: Secondary | ICD-10-CM | POA: Diagnosis not present

## 2023-10-24 DIAGNOSIS — I272 Pulmonary hypertension, unspecified: Secondary | ICD-10-CM | POA: Diagnosis not present

## 2023-10-24 DIAGNOSIS — R7303 Prediabetes: Secondary | ICD-10-CM | POA: Diagnosis not present

## 2023-10-24 DIAGNOSIS — K7460 Unspecified cirrhosis of liver: Secondary | ICD-10-CM | POA: Diagnosis not present

## 2023-10-24 DIAGNOSIS — M349 Systemic sclerosis, unspecified: Secondary | ICD-10-CM | POA: Diagnosis not present

## 2023-10-24 DIAGNOSIS — I340 Nonrheumatic mitral (valve) insufficiency: Secondary | ICD-10-CM | POA: Diagnosis not present

## 2023-10-26 ENCOUNTER — Encounter: Admitting: Physical Therapy

## 2023-10-31 ENCOUNTER — Telehealth: Payer: Self-pay | Admitting: *Deleted

## 2023-10-31 NOTE — Telephone Encounter (Signed)
 Received call from pt requesting to schedule a clinic appt to discuss her labs from 09/28/23. She did not do labs on 10/14/23 Scheduling message sent.

## 2023-11-01 ENCOUNTER — Telehealth: Payer: Self-pay | Admitting: *Deleted

## 2023-11-01 ENCOUNTER — Telehealth: Payer: Self-pay | Admitting: Hematology and Oncology

## 2023-11-01 NOTE — Telephone Encounter (Signed)
 Scheduled patient for a follow-up next week. Called and spoke with the patient, she is aware.

## 2023-11-01 NOTE — Telephone Encounter (Signed)
 Katie Nunez called to reports nose bleeds and tongue bleeding with she coughs. She reports she has chest congestion and coughs when the phlegm trickles down her throat. She asked if her labs could be checked when she comes on 11/07/23

## 2023-11-02 DIAGNOSIS — N186 End stage renal disease: Secondary | ICD-10-CM | POA: Diagnosis not present

## 2023-11-02 DIAGNOSIS — R040 Epistaxis: Secondary | ICD-10-CM | POA: Diagnosis not present

## 2023-11-02 DIAGNOSIS — R269 Unspecified abnormalities of gait and mobility: Secondary | ICD-10-CM | POA: Diagnosis not present

## 2023-11-02 DIAGNOSIS — I50812 Chronic right heart failure: Secondary | ICD-10-CM | POA: Diagnosis not present

## 2023-11-02 DIAGNOSIS — I38 Endocarditis, valve unspecified: Secondary | ICD-10-CM | POA: Diagnosis not present

## 2023-11-02 DIAGNOSIS — K7460 Unspecified cirrhosis of liver: Secondary | ICD-10-CM | POA: Diagnosis not present

## 2023-11-02 DIAGNOSIS — R0609 Other forms of dyspnea: Secondary | ICD-10-CM | POA: Diagnosis not present

## 2023-11-02 DIAGNOSIS — D649 Anemia, unspecified: Secondary | ICD-10-CM | POA: Diagnosis not present

## 2023-11-02 DIAGNOSIS — M359 Systemic involvement of connective tissue, unspecified: Secondary | ICD-10-CM | POA: Diagnosis not present

## 2023-11-02 DIAGNOSIS — I2721 Secondary pulmonary arterial hypertension: Secondary | ICD-10-CM | POA: Diagnosis not present

## 2023-11-02 DIAGNOSIS — R600 Localized edema: Secondary | ICD-10-CM | POA: Diagnosis not present

## 2023-11-07 ENCOUNTER — Other Ambulatory Visit: Payer: Self-pay | Admitting: Hematology and Oncology

## 2023-11-07 ENCOUNTER — Inpatient Hospital Stay

## 2023-11-07 ENCOUNTER — Inpatient Hospital Stay (HOSPITAL_BASED_OUTPATIENT_CLINIC_OR_DEPARTMENT_OTHER): Admitting: Hematology and Oncology

## 2023-11-07 ENCOUNTER — Telehealth: Payer: Self-pay

## 2023-11-07 VITALS — BP 134/76 | HR 91 | Temp 97.6°F | Resp 16 | Wt 132.1 lb

## 2023-11-07 DIAGNOSIS — K7460 Unspecified cirrhosis of liver: Secondary | ICD-10-CM | POA: Diagnosis not present

## 2023-11-07 DIAGNOSIS — D649 Anemia, unspecified: Secondary | ICD-10-CM

## 2023-11-07 DIAGNOSIS — D508 Other iron deficiency anemias: Secondary | ICD-10-CM | POA: Diagnosis not present

## 2023-11-07 DIAGNOSIS — D72819 Decreased white blood cell count, unspecified: Secondary | ICD-10-CM | POA: Diagnosis not present

## 2023-11-07 DIAGNOSIS — N186 End stage renal disease: Secondary | ICD-10-CM | POA: Diagnosis not present

## 2023-11-07 DIAGNOSIS — Z992 Dependence on renal dialysis: Secondary | ICD-10-CM | POA: Diagnosis not present

## 2023-11-07 DIAGNOSIS — E538 Deficiency of other specified B group vitamins: Secondary | ICD-10-CM | POA: Diagnosis not present

## 2023-11-07 DIAGNOSIS — D693 Immune thrombocytopenic purpura: Secondary | ICD-10-CM | POA: Diagnosis not present

## 2023-11-07 DIAGNOSIS — D631 Anemia in chronic kidney disease: Secondary | ICD-10-CM | POA: Diagnosis not present

## 2023-11-07 LAB — CMP (CANCER CENTER ONLY)
ALT: 12 U/L (ref 0–44)
AST: 16 U/L (ref 15–41)
Albumin: 3.6 g/dL (ref 3.5–5.0)
Alkaline Phosphatase: 112 U/L (ref 38–126)
Anion gap: 9 (ref 5–15)
BUN: 45 mg/dL — ABNORMAL HIGH (ref 8–23)
CO2: 33 mmol/L — ABNORMAL HIGH (ref 22–32)
Calcium: 9.7 mg/dL (ref 8.9–10.3)
Chloride: 97 mmol/L — ABNORMAL LOW (ref 98–111)
Creatinine: 8.1 mg/dL (ref 0.44–1.00)
GFR, Estimated: 5 mL/min — ABNORMAL LOW (ref >60)
Glucose, Bld: 99 mg/dL (ref 70–99)
Potassium: 4.5 mmol/L (ref 3.5–5.1)
Sodium: 139 mmol/L (ref 135–145)
Total Bilirubin: 0.3 mg/dL (ref 0.0–1.2)
Total Protein: 7.5 g/dL (ref 6.5–8.1)

## 2023-11-07 LAB — CBC WITH DIFFERENTIAL (CANCER CENTER ONLY)
Abs Immature Granulocytes: 0.06 K/uL (ref 0.00–0.07)
Basophils Absolute: 0.1 K/uL (ref 0.0–0.1)
Basophils Relative: 2 %
Eosinophils Absolute: 0 K/uL (ref 0.0–0.5)
Eosinophils Relative: 1 %
HCT: 25 % — ABNORMAL LOW (ref 36.0–46.0)
Hemoglobin: 7.5 g/dL — ABNORMAL LOW (ref 12.0–15.0)
Immature Granulocytes: 2 %
Lymphocytes Relative: 17 %
Lymphs Abs: 0.7 K/uL (ref 0.7–4.0)
MCH: 24.6 pg — ABNORMAL LOW (ref 26.0–34.0)
MCHC: 30 g/dL (ref 30.0–36.0)
MCV: 82 fL (ref 80.0–100.0)
Monocytes Absolute: 0.4 K/uL (ref 0.1–1.0)
Monocytes Relative: 9 %
Neutro Abs: 2.9 K/uL (ref 1.7–7.7)
Neutrophils Relative %: 69 %
Platelet Count: 238 K/uL (ref 150–400)
RBC: 3.05 MIL/uL — ABNORMAL LOW (ref 3.87–5.11)
RDW: 21.3 % — ABNORMAL HIGH (ref 11.5–15.5)
WBC Count: 4.1 K/uL (ref 4.0–10.5)
nRBC: 1 % — ABNORMAL HIGH (ref 0.0–0.2)

## 2023-11-07 LAB — RETIC PANEL
Immature Retic Fract: 35.1 % — ABNORMAL HIGH (ref 2.3–15.9)
RBC.: 3 MIL/uL — ABNORMAL LOW (ref 3.87–5.11)
Retic Count, Absolute: 67.5 K/uL (ref 19.0–186.0)
Retic Ct Pct: 2.3 % (ref 0.4–3.1)
Reticulocyte Hemoglobin: 19.1 pg — ABNORMAL LOW (ref >27.9)

## 2023-11-07 LAB — FERRITIN: Ferritin: 46 ng/mL (ref 11–307)

## 2023-11-07 LAB — IRON AND IRON BINDING CAPACITY (CC-WL,HP ONLY)
Iron: 23 ug/dL — ABNORMAL LOW (ref 28–170)
Saturation Ratios: 5 % — ABNORMAL LOW (ref 10.4–31.8)
TIBC: 424 ug/dL (ref 250–450)
UIBC: 401 ug/dL (ref 148–442)

## 2023-11-07 LAB — VITAMIN B12: Vitamin B-12: 987 pg/mL — ABNORMAL HIGH (ref 180–914)

## 2023-11-07 NOTE — Telephone Encounter (Signed)
 Copied from CRM [id number]. Topic: Clinical - Medical Advice >> Nov 04, 2023  1:53 PM Corean SAUNDERS wrote: Reason for CRM: Patient is requesting a call back from a nurse as she was prescribed some new inhalers by Dr. Shelah but she is concerned that she is not using them correctly, please call patient back to advise.   Called and spoke with the pt and explained how to use Stiolto inhaler. Pt verbalized understanding and was on the way to Drs appt with [facility] and was unable to try to use it due to her driving.  Pt understood how to use inhaler and will send Mychart message with any other questions.

## 2023-11-07 NOTE — Progress Notes (Unsigned)
 CRITICAL VALUE STICKER  CRITICAL VALUE: Creatinine 8.1  RECEIVER (on-site recipient of call):Beth Add Dinapoli, RN  DATE & TIME NOTIFIED: 11/07/23 3:15 pm  MESSENGER (representative from lab): Heather  MD NOTIFIED: Dr. Federico  TIME OF NOTIFICATION: 3:25 pm  RESPONSE:  No change in plan

## 2023-11-07 NOTE — Progress Notes (Unsigned)
 [facility] [phone]) [phone]   [phone]  PROGRESS NOTE  Patient Care Team: Claudene Pellet, MD as PCP - General Freddie Lynwood HERO, MD as Consulting Physician (Oncology) Early, Krystal FALCON, MD (Inactive) as Consulting Physician (Vascular Surgery) Center, [facility], Gladis, MD as Consulting Physician (Nephrology) Jeryl Pac, MD as Referring Physician (Internal Medicine) Edda Larve, MD as Consulting Physician (Cardiology)  Hematological/Oncological History # ITP # Anemia in Setting of ESRD on Dialysis # Leukopenia in Setting of Cirrhosis 05/2017: treated with steroid taper and rituximab  with Dr. Freddie 11/18/2021: last visit with Dr. Amadeo 04/12/2022: transfer care to Dr. Federico   Interval History:  Katie Nunez Public 68 y.o. female with medical history significant for ITP who presents for a follow up visit. The patient's last visit was 10/14/2023. In the interim since the last visit she denies any changes to her health.   On exam today Katie Nunez reports she had recent blood work done and that showed elevated MMA, making her concerned that she is having difficulty absorbing her vitamin B12.  She reports that she is still quite fatigued and that her hemoglobin is down to 7.5.  She reports she has been having some issues with nosebleeds as well as tongue lesions and congestion.  She reports that she has had some occasional shortness of breath and chest tightness but associates it more with her acid reflux.  She is taking ranitidine for this.  She is unsure if she is getting EPO shots with her dialysis.  She reports her energy is okay and she is eating well.  Otherwise she denies any fevers, chills, sweats, nausea, vomiting or diarrhea.  A full 10 point ROS otherwise negative.  MEDICAL HISTORY:  Past Medical History:  Diagnosis Date   Achalasia    Anxiety    Dysplasia of cervix, low grade (CIN 1)    Environmental allergies    all  year long (12/27/2016)   ESRD (end stage renal disease) on dialysis (HCC)    TTS; [place] (12/27/2016)   Fibromyalgia    GERD (gastroesophageal reflux disease)    Gout    Hypertension    IBS (irritable bowel syndrome)    MVP (mitral valve prolapse)    RA (rheumatoid arthritis) (HCC)    FOLLOWED BY DR. SHANAHAN   Raynaud's disease    Scleroderma (HCC)    Seasonal allergies    Thrombocytopenia 07/01/2016   Acute fall to 13,000 07/01/16   Tubular adenoma 01/08/2008   CECUM   Vitamin D  deficiency     SURGICAL HISTORY: Past Surgical History:  Procedure Laterality Date   A/V FISTULAGRAM N/A 02/02/2023   Procedure: A/V Fistulagram;  Surgeon: Norine Manuelita LABOR, MD;  Location: [facility] INVASIVE CV [facility];  Service: Cardiovascular;  Laterality: N/A;   ANKLE FRACTURE SURGERY Right    AV FISTULA PLACEMENT Left 06/28/2016   Procedure: left arm ARTERIOVENOUS (AV) FISTULA CREATION;  Surgeon: Oris Krystal FALCON, MD;  Location: [facility] OR;  Service: Vascular;  Laterality: Left;   BASCILIC VEIN TRANSPOSITION Left 09/27/2016   Procedure: LEFT UPPER ARM CEPHALIC VEIN TRANSPOSITION;  Surgeon: Oris Krystal FALCON, MD;  Location: [facility] OR;  Service: Vascular;  Laterality: Left;   BREAST BIOPSY     ? side   CESAREAN SECTION  1994   CO2 LASER OF CERVIX     COLONOSCOPY W/ BIOPSIES  01/08/2008   INSERTION OF DIALYSIS CATHETER Right 06/28/2016   Procedure: INSERTION OF DIALYSIS CATHETER, right internal jugular;  Surgeon: Oris,  Krystal FALCON, MD;  Location: [facility] OR;  Service: Vascular;  Laterality: Right;   MYOMECTOMY     NASAL ENDOSCOPY WITH EPISTAXIS CONTROL N/A 12/29/2019   Procedure: NASAL ENDOSCOPY WITH EPISTAXIS CONTROL;  Surgeon: Karis Clunes, MD;  Location: [facility] OR;  Service: ENT;  Laterality: N/A;   PELVIC LAPAROSCOPY  2011   superficial thrombophlebitis Left 07-2014    SOCIAL HISTORY: Social History   Socioeconomic History   Marital status: Married    Spouse name: Not on file   Number of children: Not on file   Years of  education: Not on file   Highest education level: Not on file  Occupational History   Not on file  Tobacco Use   Smoking status: Never   Smokeless tobacco: Never  Vaping Use   Vaping status: Never Used  Substance and Sexual Activity   Alcohol use: No   Drug use: No   Sexual activity: Not Currently    Birth control/protection: Post-menopausal  Other Topics Concern   Not on file  Social History Narrative   Lives in [place]   Are you right handed or left handed? right   Are you currently employed ?    What is your current occupation? retired   Do you live at home alone?   Who lives with you? husband   What type of home do you live in: 1 story or 2 story? two    Caffeine 1 cup coffee   Social Drivers of Corporate Investment Banker Strain: Not on file  Food Insecurity: Low Risk  (04/06/2023)   Received from [facility]   Hunger Vital Sign    Within the past 12 months, you worried that your food would run out before you got money to buy more: Never true    Within the past 12 months, the food you bought just didn't last and you didn't have money to get more. : Never true  Transportation Needs: No Transportation Needs (04/06/2023)   Received from [facility]    In the past 12 months, has lack of reliable transportation kept you from medical appointments, meetings, work or from getting things needed for daily living? : No  Physical Activity: Not on file  Stress: Not on file  Social Connections: Unknown (05/22/2021)   Received from [facility]   Social Network    Social Network: Not on file  Intimate Partner Violence: Not At Risk (06/24/2022)   Humiliation, Afraid, Rape, and Kick questionnaire    Fear of Current or Ex-Partner: No    Emotionally Abused: No    Physically Abused: No    Sexually Abused: No    FAMILY HISTORY: Family History  Problem Relation Age of Onset   Cerebral aneurysm Mother    Hypertension Mother    Diabetes Mother    Heart  disease Father    Skin telangiectasia Maternal Aunt        possible telangiectasia, patient recalls reddish birthmarks   Heart disease Maternal Aunt    Diabetes Maternal Grandmother    Heart disease Paternal Grandfather    Cerebral palsy Cousin        1ST COUSIN?   Diabetes Paternal Grandmother    Heart attack Maternal Grandfather    Colon cancer Maternal Uncle        older than 6 yo at diagnosis   Cerebral palsy Other    Stomach cancer Paternal Uncle     ALLERGIES:  is allergic to other, iron ,  nitrofuran derivatives, savella [milnacipran hcl], and tape.  MEDICATIONS:  Current Outpatient Medications  Medication Sig Dispense Refill   acetaminophen  (TYLENOL ) 325 MG tablet Take 650 mg by mouth every 6 (six) hours as needed for mild pain (pain score 1-3), moderate pain (pain score 4-6) or headache.     albuterol  (VENTOLIN  HFA) 108 (90 Base) MCG/ACT inhaler INHALE 2 PUFFS INTO THE LUNGS EVERY 6 HOURS AS NEEDED FOR WHEEZING OR SHORTNESS OF BREATH 6.7 g 11   ambrisentan  (LETAIRIS ) 10 MG tablet Take 10 mg by mouth daily.     aspirin  EC 81 MG tablet Take 1 tablet (81 mg total) by mouth daily. Swallow whole. 30 tablet 11   atorvastatin  (LIPITOR ) 80 MG tablet Take 1 tablet (80 mg total) by mouth daily. 90 tablet 3   AURYXIA  1 GM 210 MG(Fe) tablet 8 tablets.     azelastine  (ASTELIN ) 0.1 % nasal spray Place 1 spray into both nostrils 2 (two) times daily as needed for rhinitis. Use in each nostril as directed 30 mL 12   bisacodyl  (DULCOLAX) 5 MG EC tablet Take 1 tablet (5 mg total) by mouth daily as needed for severe constipation. 10 tablet 0   camphor-menthol  (SARNA) lotion Apply topically 2 (two) times daily. 222 mL 0   cinacalcet  (SENSIPAR ) 60 MG tablet Take 60 mg by mouth Every Tuesday,Thursday,and Saturday with dialysis.     clonazePAM  (KLONOPIN ) 0.5 MG tablet Take 1 tablet (0.5 mg total) by mouth 2 (two) times daily as needed for anxiety. 30 tablet 0   COMIRNATY syringe      Cyanocobalamin   (VITAMIN B-12) 1000 MCG SUBL Place 1 tablet (1,000 mcg total) under the tongue daily. 30 tablet 3   cyclobenzaprine  (FLEXERIL ) 5 MG tablet Take 1 tablet (5 mg total) by mouth every 8 (eight) hours as needed for muscle spasms. 30 tablet 0   docusate sodium  (COLACE) 100 MG capsule Take 2 capsules (200 mg total) by mouth daily. 60 capsule 0   famotidine  (PEPCID ) 20 MG tablet Take 1 tablet (20 mg total) by mouth daily as needed for heartburn or indigestion. 30 tablet 0   fluticasone  (FLONASE ) 50 MCG/ACT nasal spray Place 1 spray into both nostrils daily for 7 days. 15.8 mL 0   folic acid  (FOLVITE ) 1 MG tablet Take 1 tablet (1 mg total) by mouth daily. 90 tablet 2   hydrOXYzine  (ATARAX /VISTARIL ) 25 MG tablet Take 25 mg by mouth every 8 (eight) hours as needed for itching.      hyoscyamine  (ANASPAZ ) 0.125 MG TBDP disintergrating tablet Place 0.125 mg under the tongue every 4 (four) hours as needed for cramping.     lanthanum (FOSRENOL) 1000 MG chewable tablet Chew 1,000-2,000 mg by mouth 3 (three) times daily with meals. Snack 1000 mg     levocetirizine (XYZAL ) 5 MG tablet Take 5 mg by mouth every evening.     linaclotide (LINZESS) 72 MCG capsule Take 72 mcg by mouth.     Methoxy PEG-Epoetin  Beta (MIRCERA IJ) Mircera     multivitamin (RENA-VIT) TABS tablet Take 1 tablet by mouth daily at 6 (six) AM. 30 tablet 0   pilocarpine (SALAGEN) 5 MG tablet Take 5 mg by mouth.     polyethylene glycol (MIRALAX  / GLYCOLAX ) 17 g packet Take 17 g by mouth 2 (two) times daily. 14 each 0   Selexipag  (UPTRAVI ) 800 MCG TABS Take 1 tablet (800 mcg total) by mouth in the morning and at bedtime. 60 tablet 0   Tiotropium Bromide-Olodaterol (  STIOLTO RESPIMAT ) 2.5-2.5 MCG/ACT AERS Inhale 2 puffs into the lungs daily.     tiZANidine (ZANAFLEX) 4 MG tablet Take 4 mg by mouth.     topiramate  (TOPAMAX ) 25 MG tablet Take 1 tablet (25 mg total) by mouth 2 (two) times daily. 120 tablet 3   WINREVAIR subcutaneous injection Inject into  the skin every 21 ( twenty-one) days.     No current facility-administered medications for this visit.    REVIEW OF SYSTEMS:   Constitutional: ( - ) fevers, ( - )  chills , ( - ) night sweats Eyes: ( - ) blurriness of vision, ( - ) double vision, ( - ) watery eyes Ears, nose, mouth, throat, and face: ( - ) mucositis, ( - ) sore throat Respiratory: ( - ) cough, ( - ) dyspnea, ( - ) wheezes Cardiovascular: ( - ) palpitation, ( - ) chest discomfort, ( - ) lower extremity swelling Gastrointestinal:  ( - ) nausea, ( - ) heartburn, ( - ) change in bowel habits Skin: ( - ) abnormal skin rashes Lymphatics: ( - ) new lymphadenopathy, ( - ) easy bruising Neurological: ( - ) numbness, ( - ) tingling, ( - ) new weaknesses Behavioral/Psych: ( - ) mood change, ( - ) new changes  All other systems were reviewed with the patient and are negative.  PHYSICAL EXAMINATION: Not performed due to virtual visit.   LABORATORY DATA:  I have reviewed the data as listed    Latest Ref Rng & Units 11/07/2023    2:15 PM 09/28/2023   10:05 AM 06/29/2023    9:24 AM  CBC  WBC 4.0 - 10.5 K/uL 4.1  3.7  3.8   Hemoglobin 12.0 - 15.0 g/dL 7.5  9.6  9.2   Hematocrit 36.0 - 46.0 % 25.0  30.7  29.4   Platelets 150 - 400 K/uL 238  168  177        Latest Ref Rng & Units 11/07/2023    2:15 PM 09/28/2023   10:05 AM 06/29/2023    9:24 AM  CMP  Glucose 70 - 99 mg/dL 99  78  84   BUN 8 - 23 mg/dL 45  36  34   Creatinine 0.44 - 1.00 mg/dL 1.89  2.55  2.54   Sodium 135 - 145 mmol/L 139  139  143   Potassium 3.5 - 5.1 mmol/L 4.5  3.9  4.0   Chloride 98 - 111 mmol/L 97  99  103   CO2 22 - 32 mmol/L 33  31  31   Calcium  8.9 - 10.3 mg/dL 9.7  9.5  9.2   Total Protein 6.5 - 8.1 g/dL 7.5  7.5  6.9   Total Bilirubin 0.0 - 1.2 mg/dL 0.3  0.3  0.3   Alkaline Phos 38 - 126 U/L 112  109  98   AST 15 - 41 U/L 16  16  17    ALT 0 - 44 U/L 12  11  11     RADIOGRAPHIC STUDIES: MR CERVICAL SPINE WO CONTRAST Result Date:  10/25/2023 EXAM: MRI CERVICAL SPINE WITHOUT CONTRAST 10/21/2023 07:44:20 AM TECHNIQUE: Multiplanar multisequence MRI of the cervical spine was performed. COMPARISON: MRI of the cervical spine dated 07/29/2008. CLINICAL HISTORY: adnormal gait. MR Cervical Spine W/O; Rt side weakness; (b) leg weakness; Neck pain ; (b) leg pain; (B) feet numbness; NKI; No spine sx; no hx of ca FINDINGS: BONES AND ALIGNMENT: Slight degenerative anterolisthesis at C2-C3. Mild straightening  of the normal cervical lordosis. Normal vertebral body heights. Bone marrow signal is unremarkable. SPINAL CORD: Normal spinal cord size. No abnormal spinal cord signal. SOFT TISSUES: No paraspinal mass. C2-C3: Slight degenerative anterolisthesis. No significant disc herniation. No spinal canal stenosis or neural foraminal narrowing. C3-C4: No significant disc herniation. No spinal canal stenosis or neural foraminal narrowing. C4-C5: No significant disc herniation. No spinal canal stenosis or neural foraminal narrowing. C5-C6: Mild space narrowing and endplate ridging, causing mild central spinal canal stenosis. The neural foramina are widely patent. C6-C7: No significant disc herniation. No spinal canal stenosis or neural foraminal narrowing. C7-T1: No significant disc herniation. No spinal canal stenosis or neural foraminal narrowing. IMPRESSION: 1. Mild central spinal canal stenosis at C5-6 due to mild disc space narrowing and endplate ridging. 2. Slight degenerative anterolisthesis at C2-3. Electronically signed by: Evalene Coho MD 10/25/2023 07:33 AM EDT RP Workstation: [id number]    ASSESSMENT & PLAN Katie Nunez is a 68 y.o. female with medical history significant for ITP who presents for a follow up visit.  # Normocytic anemia --Multifactorial 2/2 to ESRD, vitamin B12 and folate deficiency. --Currently on PO vitamin B12 and folic acid  supplementation --Workup from 04/12/2022 ruled out monoclonal gammopathy and iron   deficiency. -- Agree with patient's nephrologist administering EPO shots with her dialysis.  Target hemoglobin greater than 10.0 --Labs today show white blood cell 4.1, hemoglobin 7.5, MCV 82, platelets 238. --Will reach out to nephrology regarding EPO dosing -- MMA is elevated in the setting of ESRD, not as useful for determining B12 deficiency. -- Return to clinic in 3 months for labs only and 6 months for labs/follow up  # ITP -- Patient last required steroid therapy in May 2019. Also received rituximab  at that time. -- Labs from 03/23/2023 showed Plt back to normal.  -- No clear indication for intervention at this time.  Would consider replacing steroid therapy in the event her platelets were to drop below 30.  # Leukopenia -- Stable over last several years, likely secondary to cirrhosis. -- Cirrhosis and ITP are likely reasons why platelets are mildly decreased. -- Labs from 03/23/2023 show WBC back to normal.    No orders of the defined types were placed in this encounter.   All questions were answered. The patient knows to call the clinic with any problems, questions or concerns.  I have spent a total of 30 minutes minutes of  non-face-to-face time, preparing to see the patient,  counseling and educating the patient, documenting clinical information in the electronic health record, independently interpreting results and communicating results to the patient, and care coordination.   Norleen IVAR Kidney, MD Department of Hematology/Oncology [facility] Phone: [phone] Pager: [phone] Email: [email or web address]     11/08/2023 2:36 PM

## 2023-11-14 ENCOUNTER — Encounter: Payer: Self-pay | Admitting: Radiology

## 2023-11-28 ENCOUNTER — Encounter (HOSPITAL_COMMUNITY): Payer: Self-pay

## 2023-12-01 ENCOUNTER — Telehealth: Payer: Self-pay | Admitting: Physical Therapy

## 2023-12-01 NOTE — Telephone Encounter (Signed)
 informed pt of no insurance auth for tomorrows visit due to pt attendance. pt informed she can still come if she would like, but insurance coverage is not guaranteed and may not be approved with her attendance record  Celena Domino, PT, DPT 12/01/23 5:13 PM [facility] [address] [place], [place] [address] Phone # [phone] Fax [phone]

## 2023-12-02 ENCOUNTER — Ambulatory Visit: Attending: Obstetrics and Gynecology | Admitting: Physical Therapy

## 2023-12-02 DIAGNOSIS — R293 Abnormal posture: Secondary | ICD-10-CM | POA: Insufficient documentation

## 2023-12-02 DIAGNOSIS — R279 Unspecified lack of coordination: Secondary | ICD-10-CM | POA: Insufficient documentation

## 2023-12-02 DIAGNOSIS — M6281 Muscle weakness (generalized): Secondary | ICD-10-CM | POA: Diagnosis present

## 2023-12-02 NOTE — Patient Instructions (Signed)
 Bowel Control and Holding On When bowel control is decreased or lost it is helpful to understand some control techniques. Here are some tips for controlling your bowel movements.  Choose the best time of day to have a bowel movement:  Usually the best time of day for a bowel movement will be a half hour to an hour after breakfast.  For some people, a half hour to an hour after lunch will work better.  These times are best because the body uses the gastrocolic reflex, a stimulation of bowel motion that occurs with eating, to help produce a bowel movement.  Make sure that you are not rushed and have convenient access to a bathroom at your selected time.  Eat all your meals at a predictable time each day.  The bowel functions best when food is introduced at the same regular intervals.  The amount of food eaten at a given time of day should be about the same size from day to day.  The bowel functions best when food is introduced in similar quantities from day to day.  It is fine to have a small breakfast and a large lunch, or vice versa, just be consistent.  Eat two servings of fruit or vegetables and at least one serving of complex carbohydrates (whole grains such as brown rice, bran, whole wheat bread, or oatmeal) at each meal.   A serving of fruit or vegetables is a half-cup or medium-sized piece of fruit.  A serving of a complex carbohydrate is a half-cup or a slice of bread.  It is often desirable to eat more than the recommended minimum amounts of fruits, vegetables, and complex carbohydrates.  Drink plenty of water--ideally eight glasses a day.  Until regular bowel movements are established at a desired time of day, take 2-3 dried prunes (or  to 1/3 cup of prune juice) each night to stimulate morning bowel function.  Exercise daily.  You may exercise at any time of day, but you may find that bowel function is helped most if the exercise is at a consistent time each day.  About Abdominal  Massage  Abdominal massage, also called external colon massage, is a self-treatment circular massage technique that can reduce and eliminate gas and ease constipation. The colon naturally contracts in waves in a clockwise direction starting from inside the right hip, moving up toward the ribs, across the belly, and down inside the left hip.  When you perform circular abdominal massage, you help stimulate your colon's normal wave pattern of movement called peristalsis.  It is most beneficial when done after eating.  Positioning You can practice abdominal massage with oil while lying down, or in the shower with soap.  Some people find that it is just as effective to do the massage through clothing while sitting or standing.  How to Massage Start by placing your finger tips or knuckles on your right side, just inside your hip bone.  Make small circular movements while you move upward toward your rib cage.   Once you reach the bottom right side of your rib cage, take your circular movements across to the left side of the bottom of your rib cage.  Next, move downward until you reach the inside of your left hip bone.  This is the path your feces travel in your colon. Continue to perform your abdominal massage in this pattern for 10 minutes each day.     You can apply as much pressure as is comfortable in your  massage.  Start gently and build pressure as you continue to practice.  Notice any areas of pain as you massage; areas of slight pain may be relieved as you massage, but if you have areas of significant or intense pain, consult with your healthcare provider.  Other Considerations General physical activity including bending and stretching can have a beneficial massage-like effect on the colon.  Deep breathing can also stimulate the colon because breathing deeply activates the same nervous system that supplies the colon.   Abdominal massage should always be used in combination with a bowel-conscious  diet that is high in the proper type of fiber for you, fluids (primarily water), and a regular exercise program.

## 2023-12-02 NOTE — Therapy (Signed)
 OUTPATIENT PHYSICAL THERAPY FEMALE PELVIC TREATMENT/Discharge   Patient Name: Katie Nunez MRN: [id number] DOB:[birth year], 68 y.o., female Today's Date: 12/02/2023  END OF SESSION:  PT End of Session - 12/02/23 1142     Visit Number 3    Date for Recertification  08/03/23    Authorization Type none for today's visit    PT Start Time 1110    PT Stop Time 1145    PT Time Calculation (min) 35 min    Activity Tolerance Patient tolerated treatment well    Behavior During Therapy [facility] for tasks assessed/performed            Past Medical History:  Diagnosis Date   Achalasia    Anxiety    Dysplasia of cervix, low grade (CIN 1)    Environmental allergies    all year long (12/27/2016)   ESRD (end stage renal disease) on dialysis (HCC)    TTS; [place] (12/27/2016)   Fibromyalgia    GERD (gastroesophageal reflux disease)    Gout    Hypertension    IBS (irritable bowel syndrome)    MVP (mitral valve prolapse)    RA (rheumatoid arthritis) (HCC)    FOLLOWED BY DR. SHANAHAN   Raynaud's disease    Scleroderma (HCC)    Seasonal allergies    Thrombocytopenia 07/01/2016   Acute fall to 13,000 07/01/16   Tubular adenoma 01/08/2008   CECUM   Vitamin D  deficiency    Past Surgical History:  Procedure Laterality Date   A/V FISTULAGRAM N/A 02/02/2023   Procedure: A/V Fistulagram;  Surgeon: Norine Manuelita LABOR, MD;  Location: [facility];  Service: Cardiovascular;  Laterality: N/A;   ANKLE FRACTURE SURGERY Right    AV FISTULA PLACEMENT Left 06/28/2016   Procedure: left arm ARTERIOVENOUS (AV) FISTULA CREATION;  Surgeon: Oris Krystal FALCON, MD;  Location: [facility] OR;  Service: Vascular;  Laterality: Left;   BASCILIC VEIN TRANSPOSITION Left 09/27/2016   Procedure: LEFT UPPER ARM CEPHALIC VEIN TRANSPOSITION;  Surgeon: Oris Krystal FALCON, MD;  Location: [facility] OR;  Service: Vascular;  Laterality: Left;   BREAST BIOPSY     ? side   CESAREAN SECTION  1994   CO2 LASER OF CERVIX      COLONOSCOPY W/ BIOPSIES  01/08/2008   INSERTION OF DIALYSIS CATHETER Right 06/28/2016   Procedure: INSERTION OF DIALYSIS CATHETER, right internal jugular;  Surgeon: Oris Krystal FALCON, MD;  Location: [facility] OR;  Service: Vascular;  Laterality: Right;   MYOMECTOMY     NASAL ENDOSCOPY WITH EPISTAXIS CONTROL N/A 12/29/2019   Procedure: NASAL ENDOSCOPY WITH EPISTAXIS CONTROL;  Surgeon: Karis Clunes, MD;  Location: [facility] OR;  Service: ENT;  Laterality: N/A;   PELVIC LAPAROSCOPY  2011   superficial thrombophlebitis Left 07-2014   Patient Active Problem List   Diagnosis Date Noted   Anal fissure 07/27/2023   Dysphagia 07/27/2023   Feces contents abnormal 07/27/2023   Flatulence, eructation and gas pain 07/27/2023   Secondary Sjogren's syndrome 07/05/2023   Chronic respiratory failure with hypoxia (HCC) 06/01/2023   Impingement syndrome of right shoulder 01/11/2023   Other disorders of phosphorus metabolism 12/11/2022   Edema of right lower extremity 11/01/2022   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, right 11/01/2022   Unspecified lump in unspecified breast 10/24/2022   Pain due to onychomycosis of toenails of both feet 10/13/2022   Lesion of external ear canal, right 08/26/2022   Telangiectasia, hereditary hemorrhagic, of Rendu, Osler and Weber  08/26/2022   Colitis 06/25/2022   Elevated troponin 06/25/2022   Prolonged QT interval 06/25/2022   Chronic diastolic CHF (congestive heart failure) (HCC) 06/25/2022   GERD (gastroesophageal reflux disease) 06/25/2022   GAD (generalized anxiety disorder) 06/25/2022   Hyperlipidemia 06/25/2022   Atypical chest pain 06/24/2022   Noninfective gastroenteritis and colitis, unspecified 06/24/2022   Epidermoid cyst 05/12/2022   Allergic rhinitis 04/07/2022   Laryngopharyngeal reflux (LPR) 01/15/2022   Parotid mass 01/15/2022   Ankle swelling 11/26/2021   Lipodermatosclerosis of left lower extremity 10/26/2021   Seborrheic dermatitis  10/26/2021   Vascular tumor, skin 10/26/2021   Other muscle spasm 06/25/2021   Dyssynergic defecation 03/12/2021   Vaginal atrophy 03/12/2021   Anorectal ulcer 02/01/2021   Ascites 02/01/2021   Atherosclerosis 02/01/2021   Hypertensive heart disease without congestive heart failure 02/01/2021   Insomnia 02/01/2021   Mitral valve disorder 02/01/2021   Adenomatous polyp of ascending colon 11/08/2020   Neck pain 10/30/2020   Knee locking, left 10/05/2020   Right ventricular dysfunction 08/13/2020   Hemiplegia of dominant side as late effect of cerebrovascular disease (HCC) 04/09/2020   Abnormality of gait 02/25/2020   Protein-calorie malnutrition, severe 01/19/2020   Epistaxis    Sleep disturbance    Slow transit constipation    Hemorrhoids    Chronic systolic congestive heart failure (HCC)    End-stage renal disease on hemodialysis (HCC)    Right hemiparesis (HCC)    Pressure injury of skin 12/20/2019   Intraparenchymal hemorrhage of brain (HCC) 12/18/2019   Cellulitis and abscess of leg 12/16/2019   ICH (intracerebral hemorrhage) (HCC) 12/12/2019   Residual foreign body in soft tissue 12/12/2019   S/P ablation operation for arrhythmia 11/05/2019   Cellulitis 10/10/2019   Clostridium difficile colitis 10/10/2019   PVT (portal vein thrombosis) 10/05/2019   Incontinence of feces 09/10/2019   Other specified diseases of anus and rectum 09/10/2019   Pain 09/03/2019   Trigger index finger of right hand 09/03/2019   Allergy, unspecified, initial encounter 08/27/2019   Anaphylactic shock, unspecified, initial encounter 08/27/2019   Viral disease 08/22/2019   Chronic right-sided heart failure (HCC) 07/09/2019   Supraventricular tachycardia 07/09/2019   Other cirrhosis of liver (HCC) 06/27/2019   Heart failure (HCC) 02/21/2019   Heart palpitations 08/14/2018   Other fluid overload 07/27/2018   Encounter for long-term (current) use of other medications 01/18/2018   Hypothyroidism  01/18/2018   Myalgia and myositis 01/18/2018   Chronic nephritis 01/18/2018   Other long term (current) drug therapy 01/18/2018   Sleep apnea 01/18/2018   Unspecified persistent mental disorders due to conditions classified elsewhere 01/18/2018   Venous reflux 01/18/2018   Vitamin B12 deficiency 01/18/2018   Vitamin D  deficiency 01/18/2018   Obstructive lung disease (HCC) 12/30/2017   Other pruritus 12/27/2017   Eruption cyst 12/19/2017   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 (HCC) 11/28/2017   Limited scleroderma (HCC) 11/28/2017   Chronic cough 11/11/2017   Pulmonary hypertension (HCC) 11/11/2017   Pulmonary arterial hypertension (HCC) 10/07/2017   Acute ITP (HCC) 01/05/2017   ESRD (end stage renal disease) (HCC) 12/28/2016   Encounter for removal of sutures 09/09/2016   Coagulation defect, unspecified 09/01/2016   Underimmunization status 08/05/2016   Hemolytic anemia 07/26/2016   Epistaxis, recurrent 07/26/2016   Unspecified protein-calorie malnutrition 07/13/2016   Aftercare including intermittent dialysis 07/07/2016   Anemia in chronic kidney disease 07/07/2016   Hypokalemia 07/07/2016   Iron  deficiency anemia, unspecified 07/07/2016   Linear  scleroderma 07/07/2016   Nonrheumatic mitral (valve) prolapse 07/07/2016   Irritable bowel syndrome 07/07/2016   Other secondary thrombocytopenia 07/07/2016   Secondary hyperparathyroidism of renal origin 07/07/2016   Thrombocytopenia 07/01/2016   ARF (acute renal failure) 06/25/2016   Anxiety 06/25/2016   Bilateral carpal tunnel syndrome 10/22/2015   Chronic gout without tophus 10/22/2015   Chronic nonintractable headache 10/08/2015   Fibroid uterus 01/03/2012   H/O vitamin D  deficiency 01/03/2012   Post-menopausal 01/03/2012   Hereditary and idiopathic peripheral neuropathy 11/19/2011   Intestinal malabsorption 11/19/2011   Lichen planus 11/19/2011   Low back pain 11/19/2011   Diffuse spasm  of esophagus 11/11/2011   ESR raised 11/11/2011   Postinflammatory pulmonary fibrosis (HCC) 11/11/2011   Scleroderma (HCC) 11/16/2010   Rheumatoid arthritis (HCC) 11/16/2010   Raynaud's disease 11/16/2010   Symptomatic menopausal or female climacteric states 11/16/2010    PCP: Claudene Pellet, MD  REFERRING PROVIDER: Alvia Dorothyann LABOR, MD   REFERRING DIAG: [id number] (ICD-10-CM) - Fecal impaction  THERAPY DIAG:  Muscle weakness (generalized)  Unspecified lack of coordination  Abnormal posture  Rationale for Evaluation and Treatment: Rehabilitation  ONSET DATE: unknown (chronic)  SUBJECTIVE:                                                                                                                                                                                           SUBJECTIVE STATEMENT: She still has a colonoscopy scheduled in January at [facility]. She had an MRI performed at [facility] and learned nothing. She has a training and development officer at home and this does not help her pass bowel movements. She has a bowel movement every night and she feels impacted.   From eval: Patient reports to PFPT with fecal impaction. She has dealt with constipation for quite some time, and recently she had a pelvic examination that revealed dysfunction in the musculature with evacuation of stool. She is technically constipated and also cannot empty stool well due to feeling like there is a pocket where the stool is collecting. She feels like she is impacted. She also deals with hemorrhoids from straining to pass bowel movements. Her sleep is disrupted due to needing to have a bowel movement constantly. She has abdominal bloating when she is constipated.  Fluid intake: couple of glasses of water per day, 1 cup of coffee per day with her miralax , enjoys herbal teas (1 per day), a couple of sodas per day. Fruit juices occasionally   PAIN:  Are you having pain? No NPRS scale: 0/10  PRECAUTIONS: None  RED  FLAGS: Bowel or bladder incontinence: Yes: has fecal leakage when she takes stool  softeners - thinks that the fecal incontinence is from the softeners but still feels impacted   WEIGHT BEARING RESTRICTIONS: No  FALLS:  Has patient fallen in last 6 months? No  OCCUPATION: retired  ACTIVITY LEVEL : no   PLOF: Independent with household mobility with device  PATIENT GOALS: to have normal bowel movements and less bloating/constipation  PERTINENT HISTORY:  Hx: c-section 1994, anxiety, GERD, hypertension, IBS, RA  BOWEL MOVEMENT: Pain with bowel movement: Yes Type of bowel movement:Type (Bristol Stool Scale) 1-7, Frequency daily, multiple times depending on what she has taken, Strain yes if constipated, and Splinting no Fully empty rectum: No Leakage: Yes: she will have some leakage with looser stool occasionally, but this is usually not an issues  Pads: No Fiber supplement/laxative Yes - lactolose and miralax    URINATION: Pain with urination: No Fully empty bladder: Yes:   Stream: Strong Urgency: Yes  Frequency: within normal limits  Leakage: none Pads: No  INTERCOURSE:  Ability to have vaginal penetration Yes  Pain with intercourse: none DrynessYes   PREGNANCY: C-section deliveries 1 Currently pregnant No  PROLAPSE: None  OBJECTIVE:  Note: Objective measures were completed at Evaluation unless otherwise noted.  PATIENT SURVEYS:  PFIQ-7: 62  COGNITION: Overall cognitive status: Within functional limits for tasks assessed     SENSATION: Light touch: Appears intact  LUMBAR SPECIAL TESTS:  Single leg stance test: Positive  FUNCTIONAL TESTS:  Squat test: weight shift to the left, dynamic knee valgus with loading, general lumbopelvic stiffness present with transferring   GAIT: Assistive device utilized: Environmental Consultant - 4 wheeled Comments: moderate trendelenburg gait pattern on right LE with ambulation   POSTURE: rounded shoulders, forward head, increased thoracic  kyphosis, and flexed trunk   LUMBARAROM/PROM:  A/PROM A/PROM  eval  Flexion 50% limited  Extension 50% limited  Right lateral flexion 25% limited  Left lateral flexion 25% limited  Right rotation 25% limited  Left rotation 25% limited   (Blank rows = not tested)  LOWER EXTREMITY ROM: within functional limits   LOWER EXTREMITY MMT:  MMT Right eval Left eval  Hip flexion 3+ 4  Hip extension 3+ 4  Hip abduction 3+ 3+  Hip adduction 4 4  Hip internal rotation    Hip external rotation    Knee flexion 4 4  Knee extension 4 4  Ankle dorsiflexion    Ankle plantarflexion    Ankle inversion    Ankle eversion     (Blank rows = not tested) PALPATION:   General: no significant tenderness to palpation of bilateral adductors, hip flexors, or lumbopelvic musculature. Some tenderness to palpation of bilateral ASIS landmarks   Pelvic Alignment: within normal limits   Abdominal: upper chest breathing and abdominal bracing present in seated position. With supine diaphragmatic breathing, patient demonstrates decreased lower rib excursion with inhalation                 External Perineal Exam: rectal examination to be performed at first follow up visit due to time limitations.                             Internal Pelvic Floor: rectal examination to be performed at first follow up visit due to time limitations.  Patient confirms identification and approves PT to assess internal pelvic floor and treatment Yes No emotional/communication barriers or cognitive limitation. Patient is motivated to learn. Patient understands and agrees with treatment goals and plan. PT explains  patient will be examined in standing, sitting, and lying down to see how their muscles and joints work. When they are ready, they will be asked to remove their underwear so PT can examine their perineum. The patient is also given the option of providing their own chaperone as one is not provided in our facility. The patient  also has the right and is explained the right to defer or refuse any part of the evaluation or treatment including the internal exam. With the patient's consent, PT will use one gloved finger to gently assess the muscles of the pelvic floor, seeing how well it contracts and relaxes and if there is muscle symmetry. After, the patient will get dressed and PT and patient will discuss exam findings and plan of care. PT and patient discuss plan of care, schedule, attendance policy and HEP activities.  PELVIC MMT: rectal examination to be performed at first follow up visit due to time limitations.   MMT eval  Vaginal   Internal Anal Sphincter   External Anal Sphincter   Puborectalis   Diastasis Recti   (Blank rows = not tested)       TONE: rectal examination to be performed at first follow up visit due to time limitations.  PROLAPSE: rectal examination to be performed at first follow up visit due to time limitations.  TODAY'S TREATMENT:                                                                                                                              DATE:   EVAL 07/06/23: Examination completed, findings reviewed, pt educated on POC, HEP, and self care. Pt motivated to participate in PT and agreeable to attempt recommendations.   Neuro re-ed/Self care:  How to pass bowel movements:  Sit all the way down, feet flat on the floor or on your toilet stool  Take 3 deep breaths when you sit down to relax your anus  Imagine you are fogging up a mirror with your breath as you EXHALE and GENTLY PUSH DOWN to evacuate the stool  Do a potty dance side/side and forward/backward to see if there is any more stool to push out, and if there is, do your breathing technique again to pass the remaining stool  About Abdominal Massage Positioning You can practice abdominal massage with oil while lying down, or in the shower with soap.  Some people find that it is just as effective to do the massage through  clothing while sitting or standing. How to Massage Start by placing your finger tips or knuckles on your right side, just inside your hip bone.  Make small circular movements while you move upward toward your rib cage.   Once you reach the bottom right side of your rib cage, take your circular movements across to the left side of the bottom of your rib cage.  Next, move downward until you reach the inside of your left hip bone.  This is the path your feces travel in your colon. Continue to perform your abdominal massage in this pattern for 10 minutes each day.   10/12/23: Internal rectal examination  Supine diaphragmatic breathing + pelvic floor contraction with cueing to pull finger up and in while you exhale 2x10  Supine pelvic floor quick flick contractions + diaphragmatic breathing 2x10   12/02/23: Abdominal massage to promote stool motility and blood flow to abdomen  Internal rectal treatment:  No emotional/communication barriers or cognitive limitation. Patient is motivated to learn. Patient understands and agrees with treatment goals and plan. PT explains patient will be examined in standing, sitting, and lying down to see how their muscles and joints work. When they are ready, they will be asked to remove their underwear so PT can examine their perineum. The patient is also given the option of providing their own chaperone as one is not provided in our facility. The patient also has the right and is explained the right to defer or refuse any part of the evaluation or treatment including the internal exam. With the patient's consent, PT will use one gloved finger to gently assess the muscles of the pelvic floor, seeing how well it contracts and relaxes and if there is muscle symmetry. After, the patient will get dressed and PT and patient will discuss exam findings and plan of care. PT and patient discuss plan of care, schedule, attendance policy and HEP activities. Internal rectal mobilization  along puborectalis to promote blood flow to rectal canal  Trigger point release in deep rectum  Diaphragmatic breathing + pelvic floor lengthening with inhalation + shortening with exhalation   PATIENT EDUCATION:  Education details: relative anatomy and the connection between the diaphragm and pelvic floor, constipation management and education, toileting techniques for stool evacuation, abdominal massage to encourage transit of stool and to decrease abdominal bloating/discomfort at rest Person educated: Patient Education method: Explanation, Demonstration, Tactile cues, Verbal cues, and Handouts Education comprehension: verbalized understanding, returned demonstration, verbal cues required, tactile cues required, and needs further education  HOME EXERCISE PROGRAM: Access Code: [email or web address] URL: [email or web address] Date: 10/12/2023 Prepared by: Celena Domino  Exercises - Supine Pelvic Floor Contraction  - 1 x daily - 7 x weekly - 3 sets - 10 reps - Quick Flick Pelvic Floor Contractions in Hooklying  - 1 x daily - 7 x weekly - 3 sets - 10 reps  ASSESSMENT:  CLINICAL IMPRESSION: Patient is a 68 y.o. female  who was seen today for physical therapy treatment for fecal impaction/constipation. Internal rectal treatment performed today and patient demonstrates dyssynergy with pelvic floor contractions. Once cued, patient was able to properly coordinate her pelvic floor with diaphragmatic breathing, and exercises were provided for her to practice at home. No pain during today's exam. Pt arrived today with no prior insurance authorization to cover this visit, and she was made aware of this and wished to be seen anyway. She will be discharged today due to the fact that she has been scheduled since June of this year and has consistently no-showed or canceled her appointments. Pt informed that she will need a new referral if she wishes to resume PFPT.   OBJECTIVE IMPAIRMENTS: decreased  coordination, decreased endurance, decreased knowledge of condition, decreased mobility, difficulty walking, decreased strength, and postural dysfunction.   ACTIVITY LIMITATIONS: continence and toileting  PARTICIPATION LIMITATIONS: community activity  PERSONAL FACTORS: Age, Past/current experiences, Time since onset of injury/illness/exacerbation, and 3+ comorbidities: anxiety, GERD, hypertension, IBS, RA are also affecting patient's functional  outcome.   REHAB POTENTIAL: Good  CLINICAL DECISION MAKING: Stable/uncomplicated  EVALUATION COMPLEXITY: Low   GOALS: Goals reviewed with patient? Yes  SHORT TERM GOALS: Target date: 08/03/2023  Pt will be independent with HEP.  Baseline: Goal status: INITIAL  2.  Pt will be independent with use of squatty potty, relaxed toileting mechanics, and improved bowel movement techniques in order to increase ease of bowel movements and complete evacuation.   Baseline:  Goal status: INITIAL  3.  Pt will report 50% less abdominal bloating and discomfort due to improved muscle tone throughout the core and improved overall large intestine motility due to implementation of bowel massage daily. Baseline:  Goal status: INITIAL  LONG TERM GOALS: Target date: 01/05/2024  Pt will be independent with advanced HEP.  Baseline:  Goal status: INITIAL  2.  Pt will report her BMs are complete due to improved bowel habits and evacuation techniques to improve quality of life and to prevent sleep disruption due to discomfort from constipation. Baseline:  Goal status: INITIAL  3.  Pt will report 90% less abdominal bloating and discomfort due to improved muscle tone throughout the core and improved overall large intestine motility due to implementation of bowel massage daily. Baseline:  Goal status: INITIAL  4.  Pt will be independent with diaphragmatic breathing and down training activities in order to improve pelvic floor relaxation.  Baseline:  Goal  status: INITIAL  5.  Pt to demonstrate at least 4/5 bil hip strength for improved pelvic stability and functional squats without discomfort to improve quality of life.  Baseline:  Goal status: INITIAL PHYSICAL THERAPY DISCHARGE SUMMARY  Visits from Start of Care: 2  Current functional level related to goals / functional outcomes: See above   Remaining deficits: See above   Education / Equipment: See above   Patient agrees to discharge. Patient goals were not met. Patient is being discharged due to lack of attendance and no insurance authorization .   Celena JAYSON Domino, PT 12/02/2023, 11:43 AM

## 2023-12-12 ENCOUNTER — Other Ambulatory Visit: Payer: Self-pay

## 2023-12-12 ENCOUNTER — Encounter (HOSPITAL_COMMUNITY)
Admission: RE | Disposition: A | Discharge status: Home / Self Care | Payer: Self-pay | Source: Home / Self Care | Attending: Vascular Surgery

## 2023-12-12 ENCOUNTER — Ambulatory Visit (HOSPITAL_COMMUNITY)
Admission: RE | Admit: 2023-12-12 | Discharge: 2023-12-12 | Disposition: A | Discharge status: Home / Self Care | Attending: Vascular Surgery | Admitting: Vascular Surgery

## 2023-12-12 DIAGNOSIS — N186 End stage renal disease: Secondary | ICD-10-CM | POA: Diagnosis not present

## 2023-12-12 DIAGNOSIS — I120 Hypertensive chronic kidney disease with stage 5 chronic kidney disease or end stage renal disease: Secondary | ICD-10-CM | POA: Insufficient documentation

## 2023-12-12 DIAGNOSIS — Z992 Dependence on renal dialysis: Secondary | ICD-10-CM | POA: Diagnosis not present

## 2023-12-12 DIAGNOSIS — Y832 Surgical operation with anastomosis, bypass or graft as the cause of abnormal reaction of the patient, or of later complication, without mention of misadventure at the time of the procedure: Secondary | ICD-10-CM | POA: Insufficient documentation

## 2023-12-12 DIAGNOSIS — T82898A Other specified complication of vascular prosthetic devices, implants and grafts, initial encounter: Secondary | ICD-10-CM | POA: Insufficient documentation

## 2023-12-12 HISTORY — PX: A/V FISTULAGRAM: CATH118298

## 2023-12-12 SURGERY — A/V FISTULAGRAM
Anesthesia: LOCAL | Site: Arm Upper | Laterality: Left

## 2023-12-12 MED ORDER — HEPARIN (PORCINE) IN NACL 1000-0.9 UT/500ML-% IV SOLN
INTRAVENOUS | Status: DC | PRN
  Administered 2023-12-12: 500 mL

## 2023-12-12 MED ORDER — IODIXANOL 320 MG/ML IV SOLN
INTRAVENOUS | Status: DC | PRN
  Administered 2023-12-12: 12 mL via INTRAVENOUS

## 2023-12-12 MED ORDER — LIDOCAINE HCL (PF) 1 % IJ SOLN
INTRAMUSCULAR | Status: DC | PRN
  Administered 2023-12-12: 2 mL via SUBCUTANEOUS

## 2023-12-12 SURGICAL SUPPLY — 5 items
KIT MICROPUNCTURE NIT STIFF (SHEATH) IMPLANT
SHEATH PROBE COVER 6X72 (BAG) IMPLANT
STOPCOCK MORSE 400PSI 3WAY (MISCELLANEOUS) IMPLANT
TRAY PV CATH (CUSTOM PROCEDURE TRAY) ×1 IMPLANT
TUBING CIL FLEX 10 FLL-RA (TUBING) IMPLANT

## 2023-12-12 NOTE — H&P (Signed)
 [facility] [place]  ASSESSMENT / PLAN: 68 y.o. female with end-stage renal disease, dialyzing through a left arm brachiocephalic AV fistula.  The fistula is giving alarms at dialysis.  After evaluation, the patient was offered fistulogram for further evaluation.  She is amenable.  CHIEF COMPLAINT: End-stage renal disease  HISTORY OF PRESENT ILLNESS: Katie HAUSMANN is a 68 y.o. female with end-stage renal disease dialyzing through a left arm brachiocephalic AV fistula.  The fistula has been giving low flow rate alarms at dialysis.  Patient has no complaints today.  Past Medical History:  Diagnosis Date   Achalasia    Anxiety    Dysplasia of cervix, low grade (CIN 1)    Environmental allergies    all year long (12/27/2016)   ESRD (end stage renal disease) on dialysis (HCC)    TTS; [facility] (12/27/2016)   Fibromyalgia    GERD (gastroesophageal reflux disease)    Gout    Hypertension    IBS (irritable bowel syndrome)    MVP (mitral valve prolapse)    RA (rheumatoid arthritis) (HCC)    FOLLOWED BY DR. SHANAHAN   Raynaud's disease    Scleroderma (HCC)    Seasonal allergies    Thrombocytopenia 07/01/2016   Acute fall to 13,000 07/01/16   Tubular adenoma 01/08/2008   CECUM   Vitamin D  deficiency     Past Surgical History:  Procedure Laterality Date   A/V FISTULAGRAM N/A 02/02/2023   Procedure: A/V Fistulagram;  Surgeon: Norine Manuelita LABOR, MD;  Location: [facility];  Service: Cardiovascular;  Laterality: N/A;   ANKLE FRACTURE SURGERY Right    AV FISTULA PLACEMENT Left 06/28/2016   Procedure: left arm ARTERIOVENOUS (AV) FISTULA CREATION;  Surgeon: Oris Krystal FALCON, MD;  Location: [facility] OR;  Service: Vascular;  Laterality: Left;   BASCILIC VEIN TRANSPOSITION Left 09/27/2016   Procedure: LEFT UPPER ARM CEPHALIC VEIN TRANSPOSITION;  Surgeon: Oris Krystal FALCON, MD;  Location: [facility] OR;  Service: Vascular;  Laterality: Left;   BREAST BIOPSY     ? side    CESAREAN SECTION  1994   CO2 LASER OF CERVIX     COLONOSCOPY W/ BIOPSIES  01/08/2008   INSERTION OF DIALYSIS CATHETER Right 06/28/2016   Procedure: INSERTION OF DIALYSIS CATHETER, right internal jugular;  Surgeon: Oris Krystal FALCON, MD;  Location: [facility] OR;  Service: Vascular;  Laterality: Right;   MYOMECTOMY     NASAL ENDOSCOPY WITH EPISTAXIS CONTROL N/A 12/29/2019   Procedure: NASAL ENDOSCOPY WITH EPISTAXIS CONTROL;  Surgeon: Karis Clunes, MD;  Location: [facility] OR;  Service: ENT;  Laterality: N/A;   PELVIC LAPAROSCOPY  2011   superficial thrombophlebitis Left 07-2014    Family History  Problem Relation Age of Onset   Cerebral aneurysm Mother    Hypertension Mother    Diabetes Mother    Heart disease Father    Skin telangiectasia Maternal Aunt        possible telangiectasia, patient recalls reddish birthmarks   Heart disease Maternal Aunt    Diabetes Maternal Grandmother    Heart disease Paternal Grandfather    Cerebral palsy Cousin        1ST COUSIN?   Diabetes Paternal Grandmother    Heart attack Maternal Grandfather    Colon cancer Maternal Uncle        older than 66 yo at diagnosis   Cerebral palsy Other    Stomach cancer Paternal Uncle     Social History   Socioeconomic  History   Marital status: Married    Spouse name: Not on file   Number of children: Not on file   Years of education: Not on file   Highest education level: Not on file  Occupational History   Not on file  Tobacco Use   Smoking status: Never   Smokeless tobacco: Never  Vaping Use   Vaping status: Never Used  Substance and Sexual Activity   Alcohol use: No   Drug use: No   Sexual activity: Not Currently    Birth control/protection: Post-menopausal  Other Topics Concern   Not on file  Social History Narrative   Lives in [place]   Are you right handed or left handed? right   Are you currently employed ?    What is your current occupation? retired   Do you live at home alone?   Who lives with you?  husband   What type of home do you live in: 1 story or 2 story? two    Caffeine 1 cup coffee   Social Drivers of Corporate Investment Banker Strain: Not on file  Food Insecurity: Low Risk  (04/06/2023)   Received from [facility]   Hunger Vital Sign    Within the past 12 months, you worried that your food would run out before you got money to buy more: Never true    Within the past 12 months, the food you bought just didn't last and you didn't have money to get more. : Never true  Transportation Needs: No Transportation Needs (04/06/2023)   Received from [facility]    In the past 12 months, has lack of reliable transportation kept you from medical appointments, meetings, work or from getting things needed for daily living? : No  Physical Activity: Not on file  Stress: Not on file  Social Connections: Not on file  Intimate Partner Violence: Not At Risk (06/24/2022)   Humiliation, Afraid, Rape, and Kick questionnaire    Fear of Current or Ex-Partner: No    Emotionally Abused: No    Physically Abused: No    Sexually Abused: No    Allergies  Allergen Reactions   Other Anaphylaxis and Other (See Comments)    Do not use polyflux membrane.  Use alternate heart racing   Iron  Other (See Comments)    Other Reaction(s): Unknown    Due to liver disease   Nitrofuran Derivatives     Advised not to take   Savella [Milnacipran Hcl] Palpitations and Other (See Comments)    Unknown   Tape Rash and Other (See Comments)    Itch- unsure if it was paper or adhesive tape    Current Facility-Administered Medications  Medication Dose Route Frequency Provider Last Rate Last Admin   Heparin  (Porcine) in NaCl 1000-0.9 UT/500ML-% SOLN    PRN Magda Debby SAILOR, MD   500 mL at 12/12/23 1049   iodixanol  (VISIPAQUE ) 320 MG/ML injection    PRN Magda Debby SAILOR, MD   12 mL at 12/12/23 1051   lidocaine  (PF) (XYLOCAINE ) 1 % injection    PRN Magda Debby SAILOR, MD   2 mL at 12/12/23 1050     PHYSICAL EXAM Vitals:   12/12/23 1046 12/12/23 1047 12/12/23 1051 12/12/23 1056  BP: 97/67  108/65 106/62  Pulse: 97 (!) 102 93 91  Resp: 17  18 (!) 27  Temp:      TempSrc:      SpO2: 100%  97% 95%  No acute distress Regular rate and rhythm Unlabored breathing Left arm AV fistula with thrill  PERTINENT LABORATORY AND RADIOLOGIC DATA  Most recent CBC    Latest Ref Rng & Units 11/07/2023    2:15 PM 09/28/2023   10:05 AM 06/29/2023    9:24 AM  CBC  WBC 4.0 - 10.5 K/uL 4.1  3.7  3.8   Hemoglobin 12.0 - 15.0 g/dL 7.5  9.6  9.2   Hematocrit 36.0 - 46.0 % 25.0  30.7  29.4   Platelets 150 - 400 K/uL 238  168  177      Most recent CMP    Latest Ref Rng & Units 11/07/2023    2:15 PM 09/28/2023   10:05 AM 06/29/2023    9:24 AM  CMP  Glucose 70 - 99 mg/dL 99  78  84   BUN 8 - 23 mg/dL 45  36  34   Creatinine 0.44 - 1.00 mg/dL 1.89  2.55  2.54   Sodium 135 - 145 mmol/L 139  139  143   Potassium 3.5 - 5.1 mmol/L 4.5  3.9  4.0   Chloride 98 - 111 mmol/L 97  99  103   CO2 22 - 32 mmol/L 33  31  31   Calcium  8.9 - 10.3 mg/dL 9.7  9.5  9.2   Total Protein 6.5 - 8.1 g/dL 7.5  7.5  6.9   Total Bilirubin 0.0 - 1.2 mg/dL 0.3  0.3  0.3   Alkaline Phos 38 - 126 U/L 112  109  98   AST 15 - 41 U/L 16  16  17    ALT 0 - 44 U/L 12  11  11      Renal function CrCl cannot be calculated (Patient's most recent lab result is older than the maximum 21 days allowed.).  Hgb A1c MFr Bld (%)  Date Value  04/28/2022 5.4    LDL Chol Calc (NIH)  Date Value Ref Range Status  04/28/2022 59 0 - 99 mg/dL Final   LDL Cholesterol  Date Value Ref Range Status  09/28/2023 61 0 - 99 mg/dL Final    Comment:           Total Cholesterol/HDL:CHD Risk Coronary Heart Disease Risk Table                     Men   Women  1/2 Average Risk   3.4   3.3  Average Risk       5.0   4.4  2 X Average Risk   9.6   7.1  3 X Average Risk  23.4   11.0        Use the calculated Patient Ratio above and the CHD  Risk Table to determine the patient's CHD Risk.        ATP III CLASSIFICATION (LDL):  <100     mg/dL   Optimal  899-870  mg/dL   Near or Above                    Optimal  130-159  mg/dL   Borderline  839-810  mg/dL   High  >809     mg/dL   Very High Performed at [facility], [address], [place], [place] [address]     Debby SAILOR. Magda, MD [facility] Phone Number: [phone] 12/12/2023 10:59 AM   Total time spent on preparing this  encounter including chart review, data review, collecting history, examining the patient, and coordinating care: 30 minutes.   Portions of this report may have been transcribed using voice recognition software.  Every effort has been made to ensure accuracy; however, inadvertent computerized transcription errors may still be present.

## 2023-12-12 NOTE — Op Note (Signed)
 DATE OF SERVICE: 12/12/2023  PATIENT:  Katie Nunez  68 y.o. female  PRE-OPERATIVE DIAGNOSIS:  end-stage renal disease  POST-OPERATIVE DIAGNOSIS:  Same  PROCEDURE:   1) Ultrasound guided left arm AV fistula access (CPT 9310429671) 2) left arm fistulagram (CPT 2020342231) 3) established outpatient evaluation and management - level 3 (CPT 99213)  SURGEON:  Debby SAILOR. Magda, MD  ASSISTANT: none  ANESTHESIA:   local  ESTIMATED BLOOD LOSS: min  LOCAL MEDICATIONS USED:  LIDOCAINE    COUNTS: confirmed correct.  PATIENT DISPOSITION:  PACU - hemodynamically stable.   Delay start of Pharmacological VTE agent (>24hrs) due to surgical blood loss or risk of bleeding: no  INDICATION FOR PROCEDURE: Katie Nunez is a 68 y.o. female with end-stage renal disease on dialysis through a left arm AV fistula.  The fistula is giving alarms to clinic. After careful discussion of risks, benefits, and alternatives the patient was offered fistulagram. The patient understood and wished to proceed.  OPERATIVE FINDINGS:  Left upper extremity Central venous: No stenosis Subclavian vein: No stenosis Axillary vein: No stenosis Fistula: No stenosis Anastomosis: No stenosis  DESCRIPTION OF PROCEDURE: After identification of the patient in the pre-operative holding area, the patient was transferred to the operating room. The patient was positioned supine on the operating room table.  The left upper extremity was prepped and draped in standard fashion. A surgical pause was performed confirming correct patient, procedure, and operative location.  The left upper extremity was anesthetized with subcutaneous injection of 1% lidocaine  over the area of planned access. Using ultrasound guidance, the left upper extremity dialysis access was accessed with micropuncture technique.  Fistulogram was performed in stations with the micro sheath.  See above for details.  All endovascular equipment was removed.  A figure-of-eight  stitch was applied to the exit site with good hemostasis.  Sterile bandage was applied.  Upon completion of the case instrument and sharps counts were confirmed correct. The patient was transferred to the PACU in good condition. I was present for all portions of the procedure.  PLAN: No stenosis seen in fistula.  Okay to use fistula.  Follow-up as needed  Debby SAILOR. Magda, MD [facility] Phone Number: [phone] 12/12/2023 11:30 AM

## 2023-12-15 ENCOUNTER — Encounter (HOSPITAL_COMMUNITY): Payer: Self-pay | Admitting: Vascular Surgery

## 2023-12-15 ENCOUNTER — Telehealth: Payer: Self-pay | Admitting: Neurology

## 2023-12-15 NOTE — Telephone Encounter (Signed)
 Katie Nunez(Self) lvm stating she would like to speak with Renee.   PH: [phone]

## 2023-12-16 NOTE — Telephone Encounter (Signed)
 Per patient she wanted to let Dr. Leigh know that she is having swelling, Pain in her legs and feet. With it sometimes it can be warm to touch. More on the right side.

## 2023-12-16 NOTE — Telephone Encounter (Signed)
 Called and informed pt of dr. Venus recommendations , she understood and is going to call PCP.

## 2023-12-16 NOTE — Telephone Encounter (Signed)
 Called pt and left message to return call and that the office will be closing at 1200 today.

## 2023-12-26 ENCOUNTER — Encounter (HOSPITAL_BASED_OUTPATIENT_CLINIC_OR_DEPARTMENT_OTHER): Payer: Self-pay

## 2023-12-28 ENCOUNTER — Telehealth: Payer: Self-pay | Admitting: Neurology

## 2023-12-28 NOTE — Telephone Encounter (Signed)
 Alexia pharmacy tech with [employer]   prucalopride (Motegrity) 2 mg tablet [[id number]] - wont be covered next year  Alternatives are:  Linzess, trulance , lubiprostone    Send alternative to [facility] and send a note to cancel all active/ future r orders for [id number] - [place], [place] - [address] - PHONE: [phone] - FAX: [phone]

## 2023-12-28 NOTE — Telephone Encounter (Signed)
 Dr. Winnie,  Please see message below, and advise.  Thank you, Arland PEAK

## 2023-12-28 NOTE — Progress Notes (Signed)
 Patient called regarding blood pressure. Patient stated her systolic BP was in the 80s and she did not feel well. Patient expressed concern that she took her ambrisentan  and Uptravi  today. Discussed with patient that ambrisentan  and Uptravi  typically do not cause low blood pressure. I asked patient if more fluid than usual was removed during her last dialysis session. Patient stated that could be as she has had more swelling in her ankles. Patient stated that she went to the dentist today to have a filling done.I asked patient if they gave her anything to make her sleepy or to relax her for the dental procedures. Patient stated that they numbed her for the filling but she was not aware that she was given anything else for procedure. Patient sounded drowsy during our conversation but was able to answer questions appropriately. Patient stated that she just does not feel good. I recommended that patient be evaluated at local ER or at least at local urgent care as blood pressure is low for unclear reason and patient feels unwell.

## 2023-12-28 NOTE — Telephone Encounter (Signed)
 Pt called in this afternoon, Pt is having pain in feet,  toes, and ankles. Both legs are hot and cold , and she is having sharp pains and tightness in   her legs. Pt stated that she feels that she may have neuropathy. Pt also stated that her blood pressure is  low as well. Thanks

## 2023-12-29 ENCOUNTER — Emergency Department (HOSPITAL_BASED_OUTPATIENT_CLINIC_OR_DEPARTMENT_OTHER)

## 2023-12-29 ENCOUNTER — Other Ambulatory Visit: Payer: Self-pay

## 2023-12-29 ENCOUNTER — Emergency Department (HOSPITAL_BASED_OUTPATIENT_CLINIC_OR_DEPARTMENT_OTHER)
Admission: EM | Admit: 2023-12-29 | Discharge: 2023-12-29 | Disposition: A | Discharge status: Home / Self Care | Source: Home / Self Care | Attending: Emergency Medicine | Admitting: Emergency Medicine

## 2023-12-29 ENCOUNTER — Other Ambulatory Visit: Payer: Self-pay | Admitting: Neurology

## 2023-12-29 DIAGNOSIS — Z992 Dependence on renal dialysis: Secondary | ICD-10-CM | POA: Insufficient documentation

## 2023-12-29 DIAGNOSIS — Z79899 Other long term (current) drug therapy: Secondary | ICD-10-CM | POA: Diagnosis not present

## 2023-12-29 DIAGNOSIS — N186 End stage renal disease: Secondary | ICD-10-CM | POA: Insufficient documentation

## 2023-12-29 DIAGNOSIS — Z7982 Long term (current) use of aspirin: Secondary | ICD-10-CM | POA: Diagnosis not present

## 2023-12-29 DIAGNOSIS — I120 Hypertensive chronic kidney disease with stage 5 chronic kidney disease or end stage renal disease: Secondary | ICD-10-CM | POA: Insufficient documentation

## 2023-12-29 DIAGNOSIS — G629 Polyneuropathy, unspecified: Secondary | ICD-10-CM

## 2023-12-29 DIAGNOSIS — R600 Localized edema: Secondary | ICD-10-CM | POA: Insufficient documentation

## 2023-12-29 DIAGNOSIS — D631 Anemia in chronic kidney disease: Secondary | ICD-10-CM | POA: Diagnosis not present

## 2023-12-29 LAB — BASIC METABOLIC PANEL WITH GFR
Anion gap: 12 (ref 5–15)
BUN: 24 mg/dL — ABNORMAL HIGH (ref 8–23)
CO2: 31 mmol/L (ref 22–32)
Calcium: 9.6 mg/dL (ref 8.9–10.3)
Chloride: 97 mmol/L — ABNORMAL LOW (ref 98–111)
Creatinine, Ser: 4.48 mg/dL — ABNORMAL HIGH (ref 0.44–1.00)
GFR, Estimated: 10 mL/min — ABNORMAL LOW (ref >60)
Glucose, Bld: 114 mg/dL — ABNORMAL HIGH (ref 70–99)
Potassium: 3.4 mmol/L — ABNORMAL LOW (ref 3.5–5.1)
Sodium: 139 mmol/L (ref 135–145)

## 2023-12-29 LAB — CBC WITH DIFFERENTIAL/PLATELET
Abs Immature Granulocytes: 0.07 K/uL (ref 0.00–0.07)
Basophils Absolute: 0.1 K/uL (ref 0.0–0.1)
Basophils Relative: 1 %
Eosinophils Absolute: 0 K/uL (ref 0.0–0.5)
Eosinophils Relative: 1 %
HCT: 25.6 % — ABNORMAL LOW (ref 36.0–46.0)
Hemoglobin: 7.9 g/dL — ABNORMAL LOW (ref 12.0–15.0)
Immature Granulocytes: 1 %
Lymphocytes Relative: 12 %
Lymphs Abs: 0.6 K/uL — ABNORMAL LOW (ref 0.7–4.0)
MCH: 25.6 pg — ABNORMAL LOW (ref 26.0–34.0)
MCHC: 30.9 g/dL (ref 30.0–36.0)
MCV: 83.1 fL (ref 80.0–100.0)
Monocytes Absolute: 0.6 K/uL (ref 0.1–1.0)
Monocytes Relative: 11 %
Neutro Abs: 3.8 K/uL (ref 1.7–7.7)
Neutrophils Relative %: 74 %
Platelets: 239 K/uL (ref 150–400)
RBC: 3.08 MIL/uL — ABNORMAL LOW (ref 3.87–5.11)
RDW: 21.6 % — ABNORMAL HIGH (ref 11.5–15.5)
WBC: 5.2 K/uL (ref 4.0–10.5)
nRBC: 1.2 % — ABNORMAL HIGH (ref 0.0–0.2)

## 2023-12-29 MED ORDER — GABAPENTIN 100 MG PO CAPS
100.0000 mg | ORAL_CAPSULE | Freq: Once | ORAL | Status: AC
  Administered 2023-12-29: 14:00:00 100 mg via ORAL
  Filled 2023-12-29: qty 1

## 2023-12-29 MED ADMIN — Sodium Chloride IV Soln 0.9%: 500 mL | INTRAVENOUS | @ 13:00:00 | NDC 00264580210

## 2023-12-29 MED ADMIN — Cyclobenzaprine HCl Tab 5 MG: 5 mg | ORAL | @ 13:00:00 | NDC 52817033050

## 2023-12-29 MED FILL — Cyclobenzaprine HCl Tab 5 MG: 5.0000 mg | ORAL | Qty: 1 | Status: AC

## 2023-12-29 NOTE — Telephone Encounter (Signed)
 She reports that she is at the ED right now.

## 2023-12-29 NOTE — ED Notes (Signed)
 DC paperwork given and verbally understood.

## 2023-12-29 NOTE — Discharge Instructions (Signed)
 As we discussed,  your vascular study is negative for any blood clots. The swelling is likely due to weakness of the veins and arteries and can be addressed with elevation of the legs. You need to have this rechecked with your primary care doctor in the next couple of weeks. Continue your regular medications.  Return to the ED as needed for any new or concerning symptoms.

## 2023-12-29 NOTE — ED Provider Notes (Signed)
 I provided a substantive portion of the care of this patient.  I personally made/approved the management plan for this patient and take responsibility for the patient management. {Remember to document shared critical care using "edcritical" dot phrase:1}

## 2023-12-29 NOTE — ED Triage Notes (Signed)
 Presents to ED with c/o bilateral leg pain and swelling that's been 'on and off' per pt. Also reports some bruising to L arm by AV fistula. Baseline 3-4L [place] at home.

## 2023-12-29 NOTE — ED Provider Notes (Cosign Needed)
 [place] EMERGENCY DEPARTMENT AT [facility] Provider Note   CSN: [id number] Arrival date & time: 12/29/23  1049     Patient presents with: Leg Swelling   Katie Nunez is a 68 y.o. female.   Patient to ED for evaluation of painful swelling to bilateral LE's that started x 3-4 days. No history of same. No SOB, CP. No fever. No history of DVT/PE, not anticoagulated. She also reports some new bruising near the AV graft site in the upper left arm. No issues with dialysis throught the graft. History of ESRD-HD, GERD, HTN, scleroderma, Raynaud's, MVP, IBS, RA. Recent fistulagram of left arm AV graft that was found completely patent.  The history is provided by the patient and the spouse. No language interpreter was used.       Prior to Admission medications  Medication Sig Start Date End Date Taking? Authorizing Provider  calcium  acetate (PHOSLO ) 667 MG capsule Calcium  Acetate 667 mg 11/24/23 11/23/24 Yes [provider]  hydrocortisone  (ANUSOL -HC) 2.5 % rectal cream Apply topically. 12/27/23  Yes [provider]  MOTEGRITY 2 MG TABS Take 1 tablet by mouth daily. 12/26/23  Yes [provider]  acetaminophen  (TYLENOL ) 325 MG tablet Take 650 mg by mouth every 6 (six) hours as needed for mild pain (pain score 1-3), moderate pain (pain score 4-6) or headache.    [provider]  albuterol  (VENTOLIN  HFA) 108 (90 Base) MCG/ACT inhaler INHALE 2 PUFFS INTO THE LUNGS EVERY 6 HOURS AS NEEDED FOR WHEEZING OR SHORTNESS OF BREATH 04/24/23   Shelah Lamar RAMAN, MD  ambrisentan  (LETAIRIS ) 10 MG tablet Take 10 mg by mouth daily.    [provider]  aspirin  EC 81 MG tablet Take 1 tablet (81 mg total) by mouth daily. Swallow whole. 08/04/20   Sethi, Pramod S, MD  atorvastatin  (LIPITOR ) 80 MG tablet Take 1 tablet (80 mg total) by mouth daily. 06/18/21   Rosemarie Eather RAMAN, MD  AURYXIA  1 GM 210 MG(Fe) tablet 8 tablets. 06/23/23 06/22/24  [provider]   azelastine  (ASTELIN ) 0.1 % nasal spray Place 1 spray into both nostrils 2 (two) times daily as needed for rhinitis. Use in each nostril as directed 01/23/20   Love, Sharlet RAMAN, PA-C  bisacodyl  (DULCOLAX) 5 MG EC tablet Take 1 tablet (5 mg total) by mouth daily as needed for severe constipation. 06/25/22   Arrien, Elidia Sieving, MD  camphor-menthol  [facility]) lotion Apply topically 2 (two) times daily. 01/23/20   Love, Sharlet RAMAN, PA-C  cinacalcet  (SENSIPAR ) 60 MG tablet Take 60 mg by mouth Every Tuesday,Thursday,and Saturday with dialysis.    [provider]  clonazePAM  (KLONOPIN ) 0.5 MG tablet Take 1 tablet (0.5 mg total) by mouth 2 (two) times daily as needed for anxiety. 01/23/20   Maurice Sharlet RAMAN, PA-C  COMIRNATY syringe  02/04/23   [provider]  Cyanocobalamin  (VITAMIN B-12) 1000 MCG SUBL Place 1 tablet (1,000 mcg total) under the tongue daily. 10/14/23   Neomi Johnston DASEN, PA-C  cyclobenzaprine  (FLEXERIL ) 5 MG tablet Take 1 tablet (5 mg total) by mouth every 8 (eight) hours as needed for muscle spasms. 11/25/21   Scherrie Lamarr SQUIBB, DPM  docusate sodium  (COLACE) 100 MG capsule Take 2 capsules (200 mg total) by mouth daily. 01/23/20   Love, Sharlet RAMAN, PA-C  famotidine  (PEPCID ) 20 MG tablet Take 1 tablet (20 mg total) by mouth daily as needed for heartburn or indigestion. 01/23/20   Love, Sharlet RAMAN, PA-C  fluticasone  (FLONASE ) 50  MCG/ACT nasal spray Place 1 spray into both nostrils daily for 7 days. 03/24/22 10/03/23  Elnor Jayson LABOR, DO  folic acid  (FOLVITE ) 1 MG tablet Take 1 tablet (1 mg total) by mouth daily. 05/30/23   Thayil, Irene T, PA-C  gabapentin  (NEURONTIN ) 100 MG capsule Take 1 capsule (100 mg total) by mouth 3 (three) times daily. 12/29/23   Leigh Venetia CROME, MD  hydrOXYzine  (ATARAX /VISTARIL ) 25 MG tablet Take 25 mg by mouth every 8 (eight) hours as needed for itching.  06/22/19   [provider]  hyoscyamine  (ANASPAZ ) 0.125 MG TBDP disintergrating tablet Place 0.125 mg under  the tongue every 4 (four) hours as needed for cramping.    [provider]  lanthanum (FOSRENOL) 1000 MG chewable tablet Chew 1,000-2,000 mg by mouth 3 (three) times daily with meals. Snack 1000 mg    [provider]  levocetirizine (XYZAL ) 5 MG tablet Take 5 mg by mouth every evening. 04/15/20   [provider]  linaclotide LARUE) 72 MCG capsule Take 72 mcg by mouth. 07/05/23 01/01/24  [provider]  Methoxy PEG-Epoetin  Beta (MIRCERA IJ) Mircera 01/18/23 01/17/24  [provider]  multivitamin (RENA-VIT) TABS tablet Take 1 tablet by mouth daily at 6 (six) AM. 01/24/20   Love, Sharlet RAMAN, PA-C  pantoprazole  (PROTONIX ) 20 MG tablet Take 20 mg by mouth daily.    [provider]  pilocarpine (SALAGEN) 5 MG tablet Take 5 mg by mouth. 07/05/23 01/01/24  [provider]  polyethylene glycol (MIRALAX  / GLYCOLAX ) 17 g packet Take 17 g by mouth 2 (two) times daily. 06/25/22   Arrien, Elidia Sieving, MD  Selexipag  (UPTRAVI ) 800 MCG TABS Take 1 tablet (800 mcg total) by mouth in the morning and at bedtime. 01/23/20   Love, Sharlet RAMAN, PA-C  Tiotropium Bromide-Olodaterol (STIOLTO RESPIMAT ) 2.5-2.5 MCG/ACT AERS Inhale 2 puffs into the lungs daily. 06/07/23   Shelah Lamar RAMAN, MD  tiZANidine (ZANAFLEX) 4 MG tablet Take 4 mg by mouth. 07/07/23   [provider]  topiramate  (TOPAMAX ) 25 MG tablet Take 1 tablet (25 mg total) by mouth 2 (two) times daily. 06/09/23   Rosemarie Eather RAMAN, MD  WINREVAIR subcutaneous injection Inject into the skin every 21 ( twenty-one) days. 08/18/22   [provider]    Allergies: Other, Iron , Nitrofuran derivatives, Savella [milnacipran hcl], and Tape    Review of Systems  Updated Vital Signs BP (!) 87/57   Pulse (!) 104   Temp 99 F (37.2 C) (Oral)   Resp 14   Wt 68.3 kg   LMP 11/15/2008   SpO2 91%   BMI 25.04 kg/m   Physical Exam Vitals and nursing note reviewed.  Constitutional:      Appearance: Normal  appearance.  HENT:     Head: Normocephalic.     Mouth/Throat:     Mouth: Mucous membranes are moist.  Neck:     Vascular: No carotid bruit.  Cardiovascular:     Rate and Rhythm: Normal rate and regular rhythm.     Heart sounds: Murmur heard.  Pulmonary:     Effort: Pulmonary effort is normal.     Breath sounds: No wheezing, rhonchi or rales.  Abdominal:     General: There is no distension.     Palpations: Abdomen is soft.     Tenderness: There is no abdominal tenderness.  Musculoskeletal:        General: Normal range of motion.     Cervical back: Normal range of  motion and neck supple.     Comments: Swelling bilateral LE's and feet without pitting edema. Equally warm. Cap refill normal.   Neurological:     Mental Status: She is alert.     (all labs ordered are listed, but only abnormal results are displayed) Labs Reviewed  CBC WITH DIFFERENTIAL/PLATELET - Abnormal; Notable for the following components:      Result Value   RBC 3.08 (*)    Hemoglobin 7.9 (*)    HCT 25.6 (*)    MCH 25.6 (*)    RDW 21.6 (*)    nRBC 1.2 (*)    Lymphs Abs 0.6 (*)    All other components within normal limits  BASIC METABOLIC PANEL WITH GFR - Abnormal; Notable for the following components:   Potassium 3.4 (*)    Chloride 97 (*)    Glucose, Bld 114 (*)    BUN 24 (*)    Creatinine, Ser 4.48 (*)    GFR, Estimated 10 (*)    All other components within normal limits    EKG: None  Radiology: US  Venous Img Lower Bilateral Result Date: 12/29/2023 CLINICAL DATA:  BILATERAL lower extremity pain and swelling for 7-10 days. EXAM: Bilateral Lower Extremity Venous Doppler Ultrasound TECHNIQUE: Gray-scale sonography with compression, as well as color and duplex ultrasound, were performed to evaluate the deep venous system(s) from the level of the common femoral vein through the popliteal and proximal calf veins. COMPARISON:  None available FINDINGS: VENOUS Normal compressibility of the common femoral,  superficial femoral, and popliteal veins, as well as the visualized calf veins. Visualized portions of profunda femoral vein and great saphenous vein unremarkable. No filling defects to suggest DVT on grayscale or color Doppler imaging. Doppler waveforms show normal direction of venous flow, normal respiratory plasticity and response to augmentation. OTHER Subcutaneous edema of the RIGHT calf is seen. Limitations: none IMPRESSION: No lower extremity DVT. Electronically Signed   By: Aliene Lloyd M.D.   On: 12/29/2023 15:20     Procedures   Medications Ordered in the ED  sodium chloride  0.9 % bolus 500 mL (0 mLs Intravenous Stopped 12/29/23 1513)  cyclobenzaprine  (FLEXERIL ) tablet 5 mg (5 mg Oral Given 12/29/23 1318)  gabapentin  (NEURONTIN ) capsule 100 mg (100 mg Oral Given 12/29/23 1339)    Clinical Course as of 12/29/23 1603  Thu Dec 29, 2023  1240 Patient to ED for evaluation of bilateral LE's that she reports is new. No SOB. H/O COPD on chronic O2 24/7 at 3-4 L. No fever. No history of clots. Blood pressure noted to be low, 85/51, similar to past history on chart review.  [SU]  1400 Recheck of BP 73/44, 79/39. Hgb found to be low at 7.9. History of same but baseline appears to be 9. No history of transfusions in the past.  [SU]  1448 Patient seen by Dr. Armenta. Venous doppler bilateral LE's pending. [SU]  1558 Venous doppler resulted and is negative for DVT. Blood pressure back to her stated normal of 87/57. She is eating. Feet and mildly improved. Discussed that she is felt stable for discharge. Her hemoglobin will need close follow up with her doctor.   Discussed her concern regarding bruising around the fistula. There is a palpable thrill. No swelling or redness. There are small areas of bruising that are nontender. Discussed recheck at her next dialysis session (in 2 days).  [SU]    Clinical Course User Index [SU] Odell Balls, PA-C  Medical Decision  Making Amount and/or Complexity of Data Reviewed Labs: ordered.  Risk Prescription drug management.        Final diagnoses:  Peripheral edema  Anemia due to chronic kidney disease, on chronic dialysis [facility])    ED Discharge Orders     None          Odell Balls, PA-C 12/29/23 1603

## 2024-01-02 ENCOUNTER — Encounter (HOSPITAL_BASED_OUTPATIENT_CLINIC_OR_DEPARTMENT_OTHER): Payer: Self-pay

## 2024-01-02 ENCOUNTER — Other Ambulatory Visit: Payer: Self-pay

## 2024-01-02 ENCOUNTER — Emergency Department (HOSPITAL_BASED_OUTPATIENT_CLINIC_OR_DEPARTMENT_OTHER)
Admission: EM | Admit: 2024-01-02 | Discharge: 2024-01-03 | Disposition: A | Discharge status: Home / Self Care | Attending: Emergency Medicine | Admitting: Emergency Medicine

## 2024-01-02 ENCOUNTER — Emergency Department (HOSPITAL_BASED_OUTPATIENT_CLINIC_OR_DEPARTMENT_OTHER)

## 2024-01-02 DIAGNOSIS — K5900 Constipation, unspecified: Secondary | ICD-10-CM | POA: Insufficient documentation

## 2024-01-02 DIAGNOSIS — R1084 Generalized abdominal pain: Secondary | ICD-10-CM

## 2024-01-02 DIAGNOSIS — Z992 Dependence on renal dialysis: Secondary | ICD-10-CM | POA: Insufficient documentation

## 2024-01-02 DIAGNOSIS — Z7982 Long term (current) use of aspirin: Secondary | ICD-10-CM | POA: Diagnosis not present

## 2024-01-02 DIAGNOSIS — N186 End stage renal disease: Secondary | ICD-10-CM | POA: Insufficient documentation

## 2024-01-02 LAB — CBC WITH DIFFERENTIAL/PLATELET
Abs Immature Granulocytes: 0.04 K/uL (ref 0.00–0.07)
Basophils Absolute: 0.1 K/uL (ref 0.0–0.1)
Basophils Relative: 1 %
Eosinophils Absolute: 0 K/uL (ref 0.0–0.5)
Eosinophils Relative: 1 %
HCT: 25.2 % — ABNORMAL LOW (ref 36.0–46.0)
Hemoglobin: 7.6 g/dL — ABNORMAL LOW (ref 12.0–15.0)
Immature Granulocytes: 1 %
Lymphocytes Relative: 15 %
Lymphs Abs: 0.6 K/uL — ABNORMAL LOW (ref 0.7–4.0)
MCH: 25.6 pg — ABNORMAL LOW (ref 26.0–34.0)
MCHC: 30.2 g/dL (ref 30.0–36.0)
MCV: 84.8 fL (ref 80.0–100.0)
Monocytes Absolute: 0.4 K/uL (ref 0.1–1.0)
Monocytes Relative: 10 %
Neutro Abs: 2.9 K/uL (ref 1.7–7.7)
Neutrophils Relative %: 72 %
Platelets: 232 K/uL (ref 150–400)
RBC: 2.97 MIL/uL — ABNORMAL LOW (ref 3.87–5.11)
RDW: 21.6 % — ABNORMAL HIGH (ref 11.5–15.5)
WBC: 4 K/uL (ref 4.0–10.5)
nRBC: 1.7 % — ABNORMAL HIGH (ref 0.0–0.2)

## 2024-01-02 LAB — COMPREHENSIVE METABOLIC PANEL WITH GFR
ALT: 9 U/L (ref 0–44)
AST: 21 U/L (ref 15–41)
Albumin: 3.4 g/dL — ABNORMAL LOW (ref 3.5–5.0)
Alkaline Phosphatase: 125 U/L (ref 38–126)
Anion gap: 10 (ref 5–15)
BUN: 22 mg/dL (ref 8–23)
CO2: 32 mmol/L (ref 22–32)
Calcium: 10.4 mg/dL — ABNORMAL HIGH (ref 8.9–10.3)
Chloride: 96 mmol/L — ABNORMAL LOW (ref 98–111)
Creatinine, Ser: 4.78 mg/dL — ABNORMAL HIGH (ref 0.44–1.00)
GFR, Estimated: 9 mL/min — ABNORMAL LOW
Glucose, Bld: 97 mg/dL (ref 70–99)
Potassium: 3.6 mmol/L (ref 3.5–5.1)
Sodium: 138 mmol/L (ref 135–145)
Total Bilirubin: 0.3 mg/dL (ref 0.0–1.2)
Total Protein: 7 g/dL (ref 6.5–8.1)

## 2024-01-02 LAB — LIPASE, BLOOD: Lipase: 34 U/L (ref 11–51)

## 2024-01-02 MED ORDER — FLEET ENEMA RE ENEM
1.0000 | ENEMA | Freq: Once | RECTAL | Status: AC
  Administered 2024-01-02: 1 via RECTAL
  Filled 2024-01-02: qty 1

## 2024-01-02 NOTE — ED Notes (Signed)
 Enema minimally successful. Pt sts it still feels like it's stuck up there.

## 2024-01-02 NOTE — ED Triage Notes (Addendum)
 Pt c/o possible bowel obstruction/ constipation. Last BM was awhile ago. Diffuse associated abd pain. Requesting enema, says that OTC remedies have not helped Dialysis T, Th, Sat, went today r/t holiday

## 2024-01-02 NOTE — ED Provider Notes (Signed)
 " [place] EMERGENCY DEPARTMENT AT [facility] Provider Note   CSN: [id number] Arrival date & time: 01/02/24  1724     Patient presents with: Abdominal Pain   Katie Nunez is a 68 y.o. female.  {Add pertinent medical, surgical, social history, OB history to HPI:32947} HPI Patient reports that she has been feeling like she cannot clear her bowels and is constipated for quite a while.  She reports she tries to have a bowel movement just does not feel like she can push it out.  She feels like there is stool in the rectum.  She reports when she is sitting she feels like she is sitting on a ball.  Reports sometimes she feels nauseated and does not feel like she is eating as much.  No fevers.  Patient does have ESRD and went to dialysis today.  She is thinking maybe an enema could be helpful for resolving her feeling of constipation.  She just does not feel like she can have a bowel movement on her own.    Prior to Admission medications  Medication Sig Start Date End Date Taking? Authorizing Provider  acetaminophen  (TYLENOL ) 325 MG tablet Take 650 mg by mouth every 6 (six) hours as needed for mild pain (pain score 1-3), moderate pain (pain score 4-6) or headache.    [provider]  albuterol  (VENTOLIN  HFA) 108 (90 Base) MCG/ACT inhaler INHALE 2 PUFFS INTO THE LUNGS EVERY 6 HOURS AS NEEDED FOR WHEEZING OR SHORTNESS OF BREATH 04/24/23   Shelah Lamar RAMAN, MD  ambrisentan  (LETAIRIS ) 10 MG tablet Take 10 mg by mouth daily.    [provider]  aspirin  EC 81 MG tablet Take 1 tablet (81 mg total) by mouth daily. Swallow whole. 08/04/20   Sethi, Pramod S, MD  atorvastatin  (LIPITOR ) 80 MG tablet Take 1 tablet (80 mg total) by mouth daily. 06/18/21   Rosemarie Eather RAMAN, MD  AURYXIA  1 GM 210 MG(Fe) tablet 8 tablets. 06/23/23 06/22/24  [provider]  azelastine  (ASTELIN ) 0.1 % nasal spray Place 1 spray into both nostrils 2 (two) times daily as needed for rhinitis. Use in each  nostril as directed 01/23/20   Love, Sharlet RAMAN, PA-C  bisacodyl  (DULCOLAX) 5 MG EC tablet Take 1 tablet (5 mg total) by mouth daily as needed for severe constipation. 06/25/22   Arrien, Mauricio Daniel, MD  calcium  acetate (PHOSLO ) 667 MG capsule Calcium  Acetate 667 mg 11/24/23 11/23/24  [provider]  camphor-menthol  VIKKI) lotion Apply topically 2 (two) times daily. 01/23/20   Love, Sharlet RAMAN, PA-C  cinacalcet  (SENSIPAR ) 60 MG tablet Take 60 mg by mouth Every Tuesday,Thursday,and Saturday with dialysis.    [provider]  clonazePAM  (KLONOPIN ) 0.5 MG tablet Take 1 tablet (0.5 mg total) by mouth 2 (two) times daily as needed for anxiety. 01/23/20   Maurice Sharlet RAMAN, PA-C  COMIRNATY syringe  02/04/23   [provider]  Cyanocobalamin  (VITAMIN B-12) 1000 MCG SUBL Place 1 tablet (1,000 mcg total) under the tongue daily. 10/14/23   Neomi Johnston DASEN, PA-C  cyclobenzaprine  (FLEXERIL ) 5 MG tablet Take 1 tablet (5 mg total) by mouth every 8 (eight) hours as needed for muscle spasms. 11/25/21   Scherrie Lamarr SQUIBB, DPM  docusate sodium  (COLACE) 100 MG capsule Take 2 capsules (200 mg total) by mouth daily. 01/23/20   Love, Sharlet RAMAN, PA-C  famotidine  (PEPCID ) 20 MG tablet Take 1 tablet (20 mg total) by mouth daily as needed for heartburn or indigestion.  01/23/20   Love, Sharlet RAMAN, PA-C  fluticasone  (FLONASE ) 50 MCG/ACT nasal spray Place 1 spray into both nostrils daily for 7 days. 03/24/22 10/03/23  Elnor Jayson LABOR, DO  folic acid  (FOLVITE ) 1 MG tablet Take 1 tablet (1 mg total) by mouth daily. 05/30/23   Thayil, Irene T, PA-C  gabapentin  (NEURONTIN ) 100 MG capsule Take 1 capsule (100 mg total) by mouth 3 (three) times daily. 12/29/23   Leigh Venetia CROME, MD  hydrocortisone  (ANUSOL -HC) 2.5 % rectal cream Apply topically. 12/27/23   [provider]  hydrOXYzine  (ATARAX /VISTARIL ) 25 MG tablet Take 25 mg by mouth every 8 (eight) hours as needed for itching.  06/22/19   [provider]   hyoscyamine  (ANASPAZ ) 0.125 MG TBDP disintergrating tablet Place 0.125 mg under the tongue every 4 (four) hours as needed for cramping.    [provider]  lanthanum (FOSRENOL) 1000 MG chewable tablet Chew 1,000-2,000 mg by mouth 3 (three) times daily with meals. Snack 1000 mg    [provider]  levocetirizine (XYZAL ) 5 MG tablet Take 5 mg by mouth every evening. 04/15/20   [provider]  Methoxy PEG-Epoetin  Beta (MIRCERA IJ) Mircera 01/18/23 01/17/24  [provider]  MOTEGRITY 2 MG TABS Take 1 tablet by mouth daily. 12/26/23   [provider]  multivitamin (RENA-VIT) TABS tablet Take 1 tablet by mouth daily at 6 (six) AM. 01/24/20   Love, Sharlet RAMAN, PA-C  pantoprazole  (PROTONIX ) 20 MG tablet Take 20 mg by mouth daily.    [provider]  polyethylene glycol (MIRALAX  / GLYCOLAX ) 17 g packet Take 17 g by mouth 2 (two) times daily. 06/25/22   Arrien, Elidia Sieving, MD  Selexipag  (UPTRAVI ) 800 MCG TABS Take 1 tablet (800 mcg total) by mouth in the morning and at bedtime. 01/23/20   Love, Sharlet RAMAN, PA-C  Tiotropium Bromide-Olodaterol (STIOLTO RESPIMAT ) 2.5-2.5 MCG/ACT AERS Inhale 2 puffs into the lungs daily. 06/07/23   Shelah Lamar RAMAN, MD  tiZANidine (ZANAFLEX) 4 MG tablet Take 4 mg by mouth. 07/07/23   [provider]  topiramate  (TOPAMAX ) 25 MG tablet Take 1 tablet (25 mg total) by mouth 2 (two) times daily. 06/09/23   Rosemarie Eather RAMAN, MD  WINREVAIR subcutaneous injection Inject into the skin every 21 ( twenty-one) days. 08/18/22   [provider]    Allergies: Other, Iron , Nitrofuran derivatives, Savella [milnacipran hcl], and Tape    Review of Systems  Updated Vital Signs BP 113/65   Pulse 92   Temp 98.5 F (36.9 C)   Resp 17   LMP 11/15/2008   SpO2 100%   Physical Exam Constitutional:      Comments: Patient is alert.  No respiratory distress.  Frail and thin in appearance.  HENT:     Mouth/Throat:     Pharynx:  Oropharynx is clear.  Eyes:     Extraocular Movements: Extraocular movements intact.  Cardiovascular:     Rate and Rhythm: Normal rate and regular rhythm.     Heart sounds: Murmur heard.  Pulmonary:     Effort: Pulmonary effort is normal.     Breath sounds: Normal breath sounds.     Comments: Breath sounds are both clear to the bases. Abdominal:     Comments: Abdomen soft.  No guarding.  No palpable mass.  Patient reports she feels uncomfortable with palpation in a U-shape as I palpate from the left lateral around the upper and then the right lateral lower.  Musculoskeletal:  Comments: Patient has about 1+ pitting edema both ankles and lower legs.  No wounds.  Calves are soft and pliable.  Skin:    General: Skin is warm and dry.     Coloration: Skin is pale.  Neurological:     Comments: Patient is alert.  Mental status clear and situationally oriented.  No focal motor deficits.     (all labs ordered are listed, but only abnormal results are displayed) Labs Reviewed  COMPREHENSIVE METABOLIC PANEL WITH GFR - Abnormal; Notable for the following components:      Result Value   Chloride 96 (*)    Creatinine, Ser 4.78 (*)    Calcium  10.4 (*)    Albumin 3.4 (*)    GFR, Estimated 9 (*)    All other components within normal limits  CBC WITH DIFFERENTIAL/PLATELET - Abnormal; Notable for the following components:   RBC 2.97 (*)    Hemoglobin 7.6 (*)    HCT 25.2 (*)    MCH 25.6 (*)    RDW 21.6 (*)    nRBC 1.7 (*)    Lymphs Abs 0.6 (*)    All other components within normal limits  LIPASE, BLOOD    EKG: None  Radiology: No results found.  {Document cardiac monitor, telemetry assessment procedure when appropriate:32947} Procedures   Medications Ordered in the ED - No data to display    {Click here for ABCD2, HEART and other calculators REFRESH Note before signing:1}                              Medical Decision Making Amount and/or Complexity of Data Reviewed Labs:  ordered.   Patient presents as outlined.  She has severe comorbid condition of ESRD on dialysis and anemia of chronic disease.  Patient reports she has had this persisting feeling of inability to have a bowel movement and rectal pressure.  Will proceed with CT abdomen pelvis to rule out obstruction or obvious pelvic mass.  {Document critical care time when appropriate  Document review of labs and clinical decision tools ie [id number], etc  Document your independent review of radiology images and any outside records  Document your discussion with family members, caretakers and with consultants  Document social determinants of health affecting pt's care  Document your decision making why or why not admission, treatments were needed:32947:::1}   Final diagnoses:  None    ED Discharge Orders     None        "

## 2024-01-03 NOTE — Discharge Instructions (Signed)
 1.  Is very important that you work with your gastroenterologist to find the best way to manage chronic constipation.  Since you are on dialysis it limits some of the types of treatments that you can take safely. 2.  Continue all of your regular medications that you take for constipation and try to adhere to a very high-fiber diet.

## 2024-01-18 ENCOUNTER — Ambulatory Visit (INDEPENDENT_AMBULATORY_CARE_PROVIDER_SITE_OTHER): Admitting: Podiatry

## 2024-01-18 DIAGNOSIS — Z91199 Patient's noncompliance with other medical treatment and regimen due to unspecified reason: Secondary | ICD-10-CM

## 2024-01-18 NOTE — Progress Notes (Signed)
"  No show for appointment   "

## 2024-01-20 ENCOUNTER — Inpatient Hospital Stay: Attending: Physician Assistant

## 2024-01-20 DIAGNOSIS — D693 Immune thrombocytopenic purpura: Secondary | ICD-10-CM | POA: Insufficient documentation

## 2024-01-26 ENCOUNTER — Telehealth: Payer: Self-pay | Admitting: Neurology

## 2024-01-26 ENCOUNTER — Telehealth: Payer: Self-pay | Admitting: Physician Assistant

## 2024-01-26 NOTE — Telephone Encounter (Signed)
 Lometa lvm stating that she is wanting to speak with the nurse she has some symptoms that she is wanting to let DrHill know about. She is wanting to get another test done with her legs, she is having severe pain.

## 2024-01-26 NOTE — Telephone Encounter (Signed)
 Called to reschedule missed appt

## 2024-01-27 ENCOUNTER — Other Ambulatory Visit: Payer: Self-pay | Admitting: *Deleted

## 2024-01-27 ENCOUNTER — Inpatient Hospital Stay

## 2024-01-27 ENCOUNTER — Encounter: Payer: Self-pay | Admitting: Hematology and Oncology

## 2024-01-27 ENCOUNTER — Telehealth: Payer: Self-pay

## 2024-01-27 DIAGNOSIS — D649 Anemia, unspecified: Secondary | ICD-10-CM

## 2024-01-27 DIAGNOSIS — D693 Immune thrombocytopenic purpura: Secondary | ICD-10-CM | POA: Diagnosis present

## 2024-01-27 DIAGNOSIS — D508 Other iron deficiency anemias: Secondary | ICD-10-CM

## 2024-01-27 LAB — CMP (CANCER CENTER ONLY)
ALT: 13 U/L (ref 0–44)
AST: 24 U/L (ref 15–41)
Albumin: 3.9 g/dL (ref 3.5–5.0)
Alkaline Phosphatase: 124 U/L (ref 38–126)
Anion gap: 14 (ref 5–15)
BUN: 33 mg/dL — ABNORMAL HIGH (ref 8–23)
CO2: 30 mmol/L (ref 22–32)
Calcium: 8 mg/dL — ABNORMAL LOW (ref 8.9–10.3)
Chloride: 96 mmol/L — ABNORMAL LOW (ref 98–111)
Creatinine: 6.1 mg/dL — ABNORMAL HIGH (ref 0.44–1.00)
GFR, Estimated: 7 mL/min — ABNORMAL LOW
Glucose, Bld: 82 mg/dL (ref 70–99)
Potassium: 3.7 mmol/L (ref 3.5–5.1)
Sodium: 139 mmol/L (ref 135–145)
Total Bilirubin: 0.3 mg/dL (ref 0.0–1.2)
Total Protein: 7.6 g/dL (ref 6.5–8.1)

## 2024-01-27 LAB — CBC WITH DIFFERENTIAL (CANCER CENTER ONLY)
Abs Immature Granulocytes: 0.08 K/uL — ABNORMAL HIGH (ref 0.00–0.07)
Basophils Absolute: 0.1 K/uL (ref 0.0–0.1)
Basophils Relative: 1 %
Eosinophils Absolute: 0.1 K/uL (ref 0.0–0.5)
Eosinophils Relative: 2 %
HCT: 24.8 % — ABNORMAL LOW (ref 36.0–46.0)
Hemoglobin: 7.2 g/dL — ABNORMAL LOW (ref 12.0–15.0)
Immature Granulocytes: 1 %
Lymphocytes Relative: 12 %
Lymphs Abs: 0.7 K/uL (ref 0.7–4.0)
MCH: 24.7 pg — ABNORMAL LOW (ref 26.0–34.0)
MCHC: 29 g/dL — ABNORMAL LOW (ref 30.0–36.0)
MCV: 85.2 fL (ref 80.0–100.0)
Monocytes Absolute: 0.6 K/uL (ref 0.1–1.0)
Monocytes Relative: 11 %
Neutro Abs: 4 K/uL (ref 1.7–7.7)
Neutrophils Relative %: 73 %
Platelet Count: 241 K/uL (ref 150–400)
RBC: 2.91 MIL/uL — ABNORMAL LOW (ref 3.87–5.11)
RDW: 24 % — ABNORMAL HIGH (ref 11.5–15.5)
WBC Count: 5.5 K/uL (ref 4.0–10.5)
nRBC: 2.7 % — ABNORMAL HIGH (ref 0.0–0.2)

## 2024-01-27 LAB — FOLATE: Folate: >20 ng/mL

## 2024-01-27 LAB — FERRITIN: Ferritin: 162 ng/mL (ref 11–307)

## 2024-01-27 LAB — VITAMIN B12: Vitamin B-12: 1487 pg/mL — ABNORMAL HIGH (ref 180–914)

## 2024-01-27 LAB — IRON AND IRON BINDING CAPACITY (CC-WL,HP ONLY)
Iron: 38 ug/dL (ref 28–170)
Saturation Ratios: 10 % — ABNORMAL LOW (ref 10.4–31.8)
TIBC: 396 ug/dL (ref 250–450)
UIBC: 358 ug/dL

## 2024-01-27 NOTE — Telephone Encounter (Signed)
 LMOVM for patient to call the office back or send a mychart message of her symptoms.

## 2024-01-27 NOTE — Telephone Encounter (Signed)
 Patient called back and said the symptoms she is having are: Real bad heaviness in both feet but more in right foot, toes are somewhat numb on both feet but more on the right, She said she is having some burning and tingling in both legs. She said she is on the way to get blood work done at noon for Dr Tessa with the [facility]. She said the test she is referring to is a nerve test for her hands and feet, she said it does a little shock through the things that they place on her, She couldn't remember the name. Call back number is [phone].

## 2024-01-27 NOTE — Telephone Encounter (Signed)
 Pt here for labs today and expressed belief that she needed a unit of blood due to fatigue. Hgb 7.2.  Dr Federico gave order for 1 unit of blood to address symptoms.  Pt reached at home by phone. No openings in infusion room today. Type and Cross was not done today.  Pt has dialysis tomorrow. Dialysis Center contacted and they do not type and cross or transfuse blood.  Pt agreeable to come Monday for labs and unit of blood here if she does not have conflict in schedule.  Pt voiced that she would check her calendar and call to confirm if she can come for transfusion Mon, 1/19.

## 2024-01-27 NOTE — Telephone Encounter (Signed)
 Called pt and informed her of Dr. Leigh recommendations she understood and would call back if she decides to go the the pain management.

## 2024-01-29 LAB — METHYLMALONIC ACID, SERUM: Methylmalonic Acid, Quantitative: 735 nmol/L — ABNORMAL HIGH (ref 0–378)

## 2024-01-30 ENCOUNTER — Inpatient Hospital Stay

## 2024-01-30 ENCOUNTER — Other Ambulatory Visit: Payer: Self-pay

## 2024-01-30 DIAGNOSIS — D649 Anemia, unspecified: Secondary | ICD-10-CM

## 2024-01-30 DIAGNOSIS — D693 Immune thrombocytopenic purpura: Secondary | ICD-10-CM | POA: Diagnosis not present

## 2024-01-30 LAB — CBC WITH DIFFERENTIAL (CANCER CENTER ONLY)
Abs Immature Granulocytes: 0.07 K/uL (ref 0.00–0.07)
Basophils Absolute: 0.1 K/uL (ref 0.0–0.1)
Basophils Relative: 1 %
Eosinophils Absolute: 0.1 K/uL (ref 0.0–0.5)
Eosinophils Relative: 2 %
HCT: 24.6 % — ABNORMAL LOW (ref 36.0–46.0)
Hemoglobin: 7.3 g/dL — ABNORMAL LOW (ref 12.0–15.0)
Immature Granulocytes: 2 %
Lymphocytes Relative: 13 %
Lymphs Abs: 0.6 K/uL — ABNORMAL LOW (ref 0.7–4.0)
MCH: 25.3 pg — ABNORMAL LOW (ref 26.0–34.0)
MCHC: 29.7 g/dL — ABNORMAL LOW (ref 30.0–36.0)
MCV: 85.1 fL (ref 80.0–100.0)
Monocytes Absolute: 0.4 K/uL (ref 0.1–1.0)
Monocytes Relative: 9 %
Neutro Abs: 3.2 K/uL (ref 1.7–7.7)
Neutrophils Relative %: 73 %
Platelet Count: 187 K/uL (ref 150–400)
RBC: 2.89 MIL/uL — ABNORMAL LOW (ref 3.87–5.11)
RDW: 24.9 % — ABNORMAL HIGH (ref 11.5–15.5)
WBC Count: 4.4 K/uL (ref 4.0–10.5)
nRBC: 1.8 % — ABNORMAL HIGH (ref 0.0–0.2)

## 2024-01-30 LAB — PREPARE RBC (CROSSMATCH)

## 2024-01-30 MED ORDER — ACETAMINOPHEN 325 MG PO TABS
650.0000 mg | ORAL_TABLET | Freq: Once | ORAL | Status: AC
  Administered 2024-01-30: 650 mg via ORAL
  Filled 2024-01-30: qty 2

## 2024-01-30 MED ORDER — SODIUM CHLORIDE 0.9% IV SOLUTION
250.0000 mL | INTRAVENOUS | Status: DC
  Administered 2024-01-30: 250 mL via INTRAVENOUS

## 2024-01-30 NOTE — Patient Instructions (Signed)

## 2024-01-31 LAB — BPAM RBC
Blood Product Expiration Date: 202602142359
ISSUE DATE / TIME: 202601191446
Unit Type and Rh: 6200

## 2024-01-31 LAB — TYPE AND SCREEN
ABO/RH(D): A POS
Antibody Screen: NEGATIVE
Unit division: 0

## 2024-02-01 ENCOUNTER — Encounter: Payer: Self-pay | Admitting: Hematology and Oncology

## 2024-03-28 ENCOUNTER — Ambulatory Visit: Admitting: Neurology

## 2024-04-24 ENCOUNTER — Ambulatory Visit: Admitting: Physician Assistant

## 2024-04-24 ENCOUNTER — Other Ambulatory Visit

## 2024-06-18 ENCOUNTER — Ambulatory Visit: Admitting: Neurology
